# Patient Record
Sex: Female | Born: 1960 | Race: Black or African American | Hispanic: No | Marital: Single | Smoking: Former smoker
Health system: Southern US, Community
[De-identification: ages and names within clinical notes are randomized; demographics above are authoritative.]

## PROBLEM LIST (undated history)

## (undated) DIAGNOSIS — I2510 Atherosclerotic heart disease of native coronary artery without angina pectoris: Secondary | ICD-10-CM

## (undated) DIAGNOSIS — N186 End stage renal disease: Secondary | ICD-10-CM

## (undated) DIAGNOSIS — R569 Unspecified convulsions: Secondary | ICD-10-CM

## (undated) DIAGNOSIS — Z789 Other specified health status: Secondary | ICD-10-CM

## (undated) DIAGNOSIS — Z992 Dependence on renal dialysis: Secondary | ICD-10-CM

## (undated) DIAGNOSIS — I671 Cerebral aneurysm, nonruptured: Secondary | ICD-10-CM

## (undated) DIAGNOSIS — E7800 Pure hypercholesterolemia, unspecified: Secondary | ICD-10-CM

## (undated) DIAGNOSIS — Z720 Tobacco use: Secondary | ICD-10-CM

## (undated) DIAGNOSIS — I4891 Unspecified atrial fibrillation: Secondary | ICD-10-CM

## (undated) DIAGNOSIS — I609 Nontraumatic subarachnoid hemorrhage, unspecified: Principal | ICD-10-CM

## (undated) DIAGNOSIS — I10 Essential (primary) hypertension: Secondary | ICD-10-CM

## (undated) DIAGNOSIS — G9340 Encephalopathy, unspecified: Secondary | ICD-10-CM

## (undated) HISTORY — DX: Essential (primary) hypertension: I10

## (undated) HISTORY — DX: Atherosclerotic heart disease of native coronary artery without angina pectoris: I25.10

## (undated) HISTORY — PX: BRAIN SURGERY: SHX531

## (undated) HISTORY — PX: ABDOMINAL HYSTERECTOMY: SHX81

## (undated) HISTORY — DX: Tobacco use: Z72.0

## (undated) HISTORY — DX: Pure hypercholesterolemia, unspecified: E78.00

---

## 2004-08-06 ENCOUNTER — Inpatient Hospital Stay (HOSPITAL_COMMUNITY): Admission: EM | Admit: 2004-08-06 | Discharge: 2004-08-08 | Payer: Self-pay | Admitting: Emergency Medicine

## 2004-08-06 ENCOUNTER — Ambulatory Visit: Payer: Self-pay | Admitting: Infectious Diseases

## 2004-08-07 ENCOUNTER — Encounter: Payer: Self-pay | Admitting: Cardiology

## 2004-08-15 ENCOUNTER — Ambulatory Visit: Payer: Self-pay | Admitting: Internal Medicine

## 2004-10-01 ENCOUNTER — Ambulatory Visit: Payer: Self-pay | Admitting: Internal Medicine

## 2004-10-01 ENCOUNTER — Inpatient Hospital Stay (HOSPITAL_COMMUNITY): Admission: EM | Admit: 2004-10-01 | Discharge: 2004-10-03 | Payer: Self-pay | Admitting: *Deleted

## 2004-10-09 ENCOUNTER — Ambulatory Visit: Payer: Self-pay | Admitting: Internal Medicine

## 2004-11-27 ENCOUNTER — Ambulatory Visit: Payer: Self-pay | Admitting: Internal Medicine

## 2004-11-27 ENCOUNTER — Ambulatory Visit (HOSPITAL_COMMUNITY): Admission: RE | Admit: 2004-11-27 | Discharge: 2004-11-27 | Payer: Self-pay | Admitting: Internal Medicine

## 2004-11-29 ENCOUNTER — Ambulatory Visit: Payer: Self-pay | Admitting: Internal Medicine

## 2005-05-19 ENCOUNTER — Emergency Department (HOSPITAL_COMMUNITY): Admission: EM | Admit: 2005-05-19 | Discharge: 2005-05-19 | Payer: Self-pay | Admitting: Emergency Medicine

## 2005-06-06 ENCOUNTER — Inpatient Hospital Stay (HOSPITAL_COMMUNITY): Admission: EM | Admit: 2005-06-06 | Discharge: 2005-06-11 | Payer: Self-pay | Admitting: *Deleted

## 2005-08-11 ENCOUNTER — Inpatient Hospital Stay (HOSPITAL_COMMUNITY): Admission: EM | Admit: 2005-08-11 | Discharge: 2005-08-15 | Payer: Self-pay | Admitting: *Deleted

## 2005-08-15 ENCOUNTER — Ambulatory Visit: Payer: Self-pay | Admitting: Family Medicine

## 2005-08-25 ENCOUNTER — Ambulatory Visit: Payer: Self-pay | Admitting: Family Medicine

## 2005-09-05 ENCOUNTER — Ambulatory Visit: Payer: Self-pay | Admitting: *Deleted

## 2005-09-08 ENCOUNTER — Ambulatory Visit: Payer: Self-pay | Admitting: Family Medicine

## 2005-09-23 ENCOUNTER — Ambulatory Visit: Payer: Self-pay | Admitting: Family Medicine

## 2005-10-06 ENCOUNTER — Ambulatory Visit (HOSPITAL_COMMUNITY): Admission: RE | Admit: 2005-10-06 | Discharge: 2005-10-06 | Payer: Self-pay | Admitting: Family Medicine

## 2005-10-23 ENCOUNTER — Emergency Department (HOSPITAL_COMMUNITY): Admission: EM | Admit: 2005-10-23 | Discharge: 2005-10-24 | Payer: Self-pay | Admitting: Emergency Medicine

## 2006-01-07 ENCOUNTER — Ambulatory Visit: Payer: Self-pay | Admitting: Family Medicine

## 2006-01-28 ENCOUNTER — Ambulatory Visit: Payer: Self-pay | Admitting: Family Medicine

## 2006-03-18 ENCOUNTER — Ambulatory Visit: Payer: Self-pay | Admitting: Family Medicine

## 2006-07-06 ENCOUNTER — Ambulatory Visit: Payer: Self-pay | Admitting: Family Medicine

## 2006-07-16 ENCOUNTER — Ambulatory Visit: Payer: Self-pay | Admitting: Family Medicine

## 2006-08-06 ENCOUNTER — Ambulatory Visit: Payer: Self-pay | Admitting: Family Medicine

## 2006-09-17 ENCOUNTER — Ambulatory Visit: Payer: Self-pay | Admitting: Family Medicine

## 2006-12-30 ENCOUNTER — Ambulatory Visit: Payer: Self-pay | Admitting: Family Medicine

## 2007-01-01 ENCOUNTER — Ambulatory Visit (HOSPITAL_COMMUNITY): Admission: RE | Admit: 2007-01-01 | Discharge: 2007-01-01 | Payer: Self-pay | Admitting: Family Medicine

## 2007-03-05 ENCOUNTER — Emergency Department (HOSPITAL_COMMUNITY): Admission: EM | Admit: 2007-03-05 | Discharge: 2007-03-05 | Payer: Self-pay | Admitting: Emergency Medicine

## 2007-08-15 ENCOUNTER — Encounter: Payer: Self-pay | Admitting: Emergency Medicine

## 2007-08-15 ENCOUNTER — Inpatient Hospital Stay (HOSPITAL_COMMUNITY): Admission: EM | Admit: 2007-08-15 | Discharge: 2007-08-17 | Payer: Self-pay

## 2007-09-08 ENCOUNTER — Ambulatory Visit: Payer: Self-pay | Admitting: Internal Medicine

## 2007-09-08 ENCOUNTER — Encounter (INDEPENDENT_AMBULATORY_CARE_PROVIDER_SITE_OTHER): Payer: Self-pay | Admitting: Family Medicine

## 2007-09-08 LAB — CONVERTED CEMR LAB
ALT: 12 units/L (ref 0–35)
AST: 13 units/L (ref 0–37)
Albumin: 4 g/dL (ref 3.5–5.2)
Alkaline Phosphatase: 87 units/L (ref 39–117)
BUN: 15 mg/dL (ref 6–23)
CO2: 22 meq/L (ref 19–32)
Calcium: 9 mg/dL (ref 8.4–10.5)
Chloride: 106 meq/L (ref 96–112)
Cholesterol: 183 mg/dL (ref 0–200)
Creatinine, Ser: 1.41 mg/dL — ABNORMAL HIGH (ref 0.40–1.20)
Glucose, Bld: 96 mg/dL (ref 70–99)
HDL: 40 mg/dL (ref >39)
LDL Cholesterol: 125 mg/dL — ABNORMAL HIGH (ref 0–99)
Potassium: 4.4 meq/L (ref 3.5–5.3)
Sodium: 141 meq/L (ref 135–145)
Total Bilirubin: 0.5 mg/dL (ref 0.3–1.2)
Total CHOL/HDL Ratio: 4.6
Total Protein: 7 g/dL (ref 6.0–8.3)
Triglycerides: 91 mg/dL (ref <150)
VLDL: 18 mg/dL (ref 0–40)

## 2007-12-14 ENCOUNTER — Emergency Department (HOSPITAL_COMMUNITY): Admission: EM | Admit: 2007-12-14 | Discharge: 2007-12-14 | Payer: Self-pay | Admitting: Emergency Medicine

## 2007-12-16 ENCOUNTER — Ambulatory Visit: Payer: Self-pay | Admitting: Internal Medicine

## 2007-12-16 ENCOUNTER — Inpatient Hospital Stay (HOSPITAL_COMMUNITY): Admission: EM | Admit: 2007-12-16 | Discharge: 2007-12-18 | Payer: Self-pay | Admitting: Emergency Medicine

## 2007-12-16 ENCOUNTER — Ambulatory Visit: Payer: Self-pay | Admitting: Cardiology

## 2007-12-17 ENCOUNTER — Encounter (INDEPENDENT_AMBULATORY_CARE_PROVIDER_SITE_OTHER): Payer: Self-pay | Admitting: Internal Medicine

## 2007-12-17 ENCOUNTER — Ambulatory Visit: Admission: RE | Admit: 2007-12-17 | Discharge: 2007-12-17 | Payer: Self-pay | Admitting: Internal Medicine

## 2008-01-05 ENCOUNTER — Encounter (INDEPENDENT_AMBULATORY_CARE_PROVIDER_SITE_OTHER): Payer: Self-pay | Admitting: Family Medicine

## 2008-01-05 ENCOUNTER — Ambulatory Visit: Payer: Self-pay | Admitting: Internal Medicine

## 2008-01-05 LAB — CONVERTED CEMR LAB
BUN: 24 mg/dL — ABNORMAL HIGH (ref 6–23)
CO2: 20 meq/L (ref 19–32)
Calcium: 8.9 mg/dL (ref 8.4–10.5)
Chloride: 110 meq/L (ref 96–112)
Creatinine, Ser: 1.41 mg/dL — ABNORMAL HIGH (ref 0.40–1.20)
Glucose, Bld: 98 mg/dL (ref 70–99)
Potassium: 4.1 meq/L (ref 3.5–5.3)
Sodium: 138 meq/L (ref 135–145)
TSH: 1.189 microintl units/mL (ref 0.350–5.50)

## 2008-01-07 ENCOUNTER — Ambulatory Visit: Payer: Self-pay | Admitting: Internal Medicine

## 2008-02-08 ENCOUNTER — Ambulatory Visit: Payer: Self-pay | Admitting: Cardiology

## 2008-05-28 ENCOUNTER — Emergency Department (HOSPITAL_COMMUNITY): Admission: EM | Admit: 2008-05-28 | Discharge: 2008-05-28 | Payer: Self-pay | Admitting: Emergency Medicine

## 2008-06-05 ENCOUNTER — Ambulatory Visit: Payer: Self-pay | Admitting: Cardiology

## 2008-06-21 ENCOUNTER — Ambulatory Visit: Payer: Self-pay | Admitting: Internal Medicine

## 2008-06-21 ENCOUNTER — Ambulatory Visit: Payer: Self-pay | Admitting: Cardiology

## 2008-06-21 LAB — CONVERTED CEMR LAB
ALT: 16 units/L (ref 0–35)
AST: 16 units/L (ref 0–37)
Albumin: 3.9 g/dL (ref 3.5–5.2)
Alkaline Phosphatase: 82 units/L (ref 39–117)
BUN: 17 mg/dL (ref 6–23)
Bilirubin, Direct: 0.2 mg/dL (ref 0.0–0.3)
CO2: 28 meq/L (ref 19–32)
Calcium: 9.3 mg/dL (ref 8.4–10.5)
Chloride: 112 meq/L (ref 96–112)
Cholesterol: 157 mg/dL (ref 0–200)
Creatinine, Ser: 1.6 mg/dL — ABNORMAL HIGH (ref 0.4–1.2)
GFR calc Af Amer: 45 mL/min
GFR calc non Af Amer: 37 mL/min
Glucose, Bld: 96 mg/dL (ref 70–99)
HDL: 30.7 mg/dL — ABNORMAL LOW (ref >39.0)
LDL Cholesterol: 104 mg/dL — ABNORMAL HIGH (ref 0–99)
Potassium: 3.3 meq/L — ABNORMAL LOW (ref 3.5–5.1)
Sodium: 143 meq/L (ref 135–145)
Total Bilirubin: 0.6 mg/dL (ref 0.3–1.2)
Total CHOL/HDL Ratio: 5.1
Total Protein: 8.1 g/dL (ref 6.0–8.3)
Triglycerides: 112 mg/dL (ref 0–149)
VLDL: 22 mg/dL (ref 0–40)

## 2008-07-14 ENCOUNTER — Ambulatory Visit: Payer: Self-pay | Admitting: Cardiology

## 2008-07-14 LAB — CONVERTED CEMR LAB
BUN: 20 mg/dL (ref 6–23)
CO2: 25 meq/L (ref 19–32)
Calcium: 8.7 mg/dL (ref 8.4–10.5)
Chloride: 111 meq/L (ref 96–112)
Creatinine, Ser: 1.6 mg/dL — ABNORMAL HIGH (ref 0.4–1.2)
GFR calc Af Amer: 45 mL/min
GFR calc non Af Amer: 37 mL/min
Glucose, Bld: 90 mg/dL (ref 70–99)
Potassium: 3 meq/L — ABNORMAL LOW (ref 3.5–5.1)
Sodium: 142 meq/L (ref 135–145)

## 2008-07-25 ENCOUNTER — Ambulatory Visit: Payer: Self-pay | Admitting: Cardiology

## 2008-07-25 LAB — CONVERTED CEMR LAB
BUN: 20 mg/dL (ref 6–23)
CO2: 27 meq/L (ref 19–32)
Calcium: 9.3 mg/dL (ref 8.4–10.5)
Chloride: 107 meq/L (ref 96–112)
Creatinine, Ser: 1.6 mg/dL — ABNORMAL HIGH (ref 0.4–1.2)
GFR calc Af Amer: 45 mL/min
GFR calc non Af Amer: 37 mL/min
Glucose, Bld: 101 mg/dL — ABNORMAL HIGH (ref 70–99)
Potassium: 4.3 meq/L (ref 3.5–5.1)
Sodium: 141 meq/L (ref 135–145)

## 2008-09-11 ENCOUNTER — Ambulatory Visit: Payer: Self-pay | Admitting: Cardiology

## 2008-09-11 LAB — CONVERTED CEMR LAB
ALT: 14 units/L (ref 0–35)
AST: 15 units/L (ref 0–37)
Albumin: 3.4 g/dL — ABNORMAL LOW (ref 3.5–5.2)
Alkaline Phosphatase: 82 units/L (ref 39–117)
Bilirubin, Direct: 0.1 mg/dL (ref 0.0–0.3)
Cholesterol: 169 mg/dL (ref 0–200)
HDL: 32.7 mg/dL — ABNORMAL LOW (ref >39.0)
LDL Cholesterol: 120 mg/dL — ABNORMAL HIGH (ref 0–99)
Total Bilirubin: 0.5 mg/dL (ref 0.3–1.2)
Total CHOL/HDL Ratio: 5.2
Total Protein: 7.2 g/dL (ref 6.0–8.3)
Triglycerides: 83 mg/dL (ref 0–149)
VLDL: 17 mg/dL (ref 0–40)

## 2008-09-19 ENCOUNTER — Ambulatory Visit: Payer: Self-pay | Admitting: Cardiology

## 2008-09-19 DIAGNOSIS — I5033 Acute on chronic diastolic (congestive) heart failure: Secondary | ICD-10-CM

## 2008-09-19 DIAGNOSIS — E782 Mixed hyperlipidemia: Secondary | ICD-10-CM | POA: Insufficient documentation

## 2008-09-19 DIAGNOSIS — I2510 Atherosclerotic heart disease of native coronary artery without angina pectoris: Secondary | ICD-10-CM

## 2008-09-19 DIAGNOSIS — N189 Chronic kidney disease, unspecified: Secondary | ICD-10-CM

## 2008-09-19 DIAGNOSIS — I10 Essential (primary) hypertension: Secondary | ICD-10-CM | POA: Insufficient documentation

## 2008-09-19 DIAGNOSIS — E785 Hyperlipidemia, unspecified: Secondary | ICD-10-CM

## 2008-09-19 DIAGNOSIS — F17200 Nicotine dependence, unspecified, uncomplicated: Secondary | ICD-10-CM | POA: Insufficient documentation

## 2008-11-03 ENCOUNTER — Encounter: Admission: RE | Admit: 2008-11-03 | Discharge: 2008-11-03 | Payer: Self-pay | Admitting: Orthopedic Surgery

## 2008-11-08 ENCOUNTER — Encounter: Admission: RE | Admit: 2008-11-08 | Discharge: 2008-11-08 | Payer: Self-pay | Admitting: Orthopedic Surgery

## 2008-12-22 ENCOUNTER — Ambulatory Visit: Payer: Self-pay | Admitting: Cardiology

## 2008-12-29 ENCOUNTER — Ambulatory Visit: Payer: Self-pay | Admitting: Cardiology

## 2008-12-29 LAB — CONVERTED CEMR LAB
Bilirubin, Direct: 0.1 mg/dL (ref 0.0–0.3)
Calcium: 8.9 mg/dL (ref 8.4–10.5)
GFR calc Af Amer: 52 mL/min
GFR calc non Af Amer: 43 mL/min
Glucose, Bld: 89 mg/dL (ref 70–99)
HDL: 28.7 mg/dL — ABNORMAL LOW (ref >39.0)
LDL Cholesterol: 99 mg/dL (ref 0–99)
Sodium: 141 meq/L (ref 135–145)
Total CHOL/HDL Ratio: 5.1
Total Protein: 6.7 g/dL (ref 6.0–8.3)
VLDL: 19 mg/dL (ref 0–40)

## 2009-05-23 ENCOUNTER — Telehealth: Payer: Self-pay | Admitting: Cardiology

## 2009-06-21 ENCOUNTER — Ambulatory Visit: Payer: Self-pay | Admitting: Cardiology

## 2009-06-21 ENCOUNTER — Telehealth: Payer: Self-pay | Admitting: Cardiology

## 2009-08-30 ENCOUNTER — Encounter (INDEPENDENT_AMBULATORY_CARE_PROVIDER_SITE_OTHER): Payer: Self-pay | Admitting: *Deleted

## 2009-10-09 ENCOUNTER — Inpatient Hospital Stay (HOSPITAL_COMMUNITY): Admission: EM | Admit: 2009-10-09 | Discharge: 2009-10-10 | Payer: Self-pay | Admitting: Emergency Medicine

## 2010-04-05 ENCOUNTER — Telehealth: Payer: Self-pay | Admitting: Cardiology

## 2010-04-09 ENCOUNTER — Telehealth: Payer: Self-pay | Admitting: Cardiology

## 2010-05-01 ENCOUNTER — Encounter (INDEPENDENT_AMBULATORY_CARE_PROVIDER_SITE_OTHER): Payer: Self-pay | Admitting: *Deleted

## 2010-06-22 ENCOUNTER — Inpatient Hospital Stay (HOSPITAL_COMMUNITY): Admission: EM | Admit: 2010-06-22 | Discharge: 2010-06-25 | Payer: Self-pay | Admitting: Emergency Medicine

## 2010-11-17 LAB — CONVERTED CEMR LAB
Albumin: 4 g/dL (ref 3.5–5.2)
BUN: 21 mg/dL (ref 6–23)
Bilirubin, Direct: 0 mg/dL (ref 0.0–0.3)
CO2: 25 meq/L (ref 19–32)
Chloride: 109 meq/L (ref 96–112)
Cholesterol: 142 mg/dL (ref 0–200)
LDL Cholesterol: 84 mg/dL (ref 0–99)
Potassium: 3.6 meq/L (ref 3.5–5.1)
Total Protein: 7.9 g/dL (ref 6.0–8.3)
Triglycerides: 99 mg/dL (ref 0.0–149.0)
VLDL: 19.8 mg/dL (ref 0.0–40.0)

## 2010-11-19 NOTE — Letter (Signed)
Summary: Appointment - Missed  [facility], [place]  [address] [place]   [place], [place] [address]   Phone: [phone]  Fax: [phone]     May 01, 2010 MRN: [id number]   [place], [place]  [address]   Dear Ms. Wetherell,  Our records indicate you missed your appointment on 04/25/10 with Dr.Mclean.  It is very important that we reach you to reschedule this appointment. We look forward to participating in your health care needs. Please contact us at the number listed above at your earliest convenience to reschedule this appointment.     Sincerely, Academic librarian Team  Appended Document: [place]  She should followup in the office.   Appended Document: [place]  flagged and routed  to scheduler to call pt to schedule appt with Dr Aundra Dubin

## 2010-11-19 NOTE — Progress Notes (Signed)
Summary: [facility]   Phone Note Refill Request Call back at [phone] Message from:  Patient on April 05, 2010 10:21 AM  Refills Requested: Medication #1:  PLAVIX 75 MG TABS 1 tab once daily  Medication #2:  FUROSEMIDE 40 MG TABS Take 1 tablet by mouth two times a day  Medication #3:  CARVEDILOL 12.5 MG TABS Take one tablet by mouth twice a day  Medication #4:  LIPITOR 80 MG TABS Take one tablet by mouth daily. POTASSIUM CHLORIDE CRYS CR 20 MEQ CR-TABS (POTASSIUM CHLORIDE CRYS CR) Take one tablet by mouth daily NIT[facility]  [facility] [address]   Method Requested: Fax to [facility] Initial call taken by: Darnell Level,  April 05, 2010 10:22 AM    Prescriptions: LIPITOR 80 MG TABS (ATORVASTATIN CALCIUM) Take one tablet by mouth daily.  #30 x 6   Entered by:   Doug Sou CMA   Authorized by:   Loralie Champagne, MD   Signed by:   Doug Sou CMA on 04/05/2010   Method used:   Electronically to        [facility]. #[phone]* (retail)       [place], [place]  [address]       Ph: [phone]       Fax: [phone]   RxID:   [id number] CARVEDILOL 12.5 MG TABS (CARVEDILOL) Take one tablet by mouth twice a day  #60 x 6   Entered by:   Doug Sou CMA   Authorized by:   Loralie Champagne, MD   Signed by:   Doug Sou CMA on 04/05/2010   Method used:   Electronically to        [facility]. [id number]* (retail)       [place], [place]  [address]       Ph: [phone]       Fax: [phone]   RxID:   [id number] FUROSEMIDE 40 MG TABS (FUROSEMIDE) Take 1 tablet by mouth two times a day  #60 x 6   Entered by:   Doug Sou CMA   Authorized by:   Loralie Champagne, MD   Signed by:   Doug Sou CMA on 04/05/2010   Method used:   Electronically to        [facility]. [id number]* (retail)       [place], [place]  [address]       Ph: [phone]       Fax:  [phone]   RxID:   [id number] PLAVIX 75 MG TABS (CLOPIDOGREL BISULFATE) 1 tab once daily  #30 x 6   Entered by:   Doug Sou CMA   Authorized by:   Loralie Champagne, MD   Signed by:   Doug Sou CMA on 04/05/2010   Method used:   Electronically to        [facility]. [id number]* (retail)       [place], [place]  [address]       Ph: [phone]       Fax: [phone]   RxID:   [id number]

## 2010-11-19 NOTE — Progress Notes (Signed)
Summary: pt requesting [facility] [address]  Medications Added SPIRONOLACTONE 25 MG TABS (SPIRONOLACTONE) Take one tablet by mouth daily POTASSIUM CHLORIDE CRYS CR 20 MEQ CR-TABS (POTASSIUM CHLORIDE CRYS CR) Take one tablet by mouth daily NIFEDIPINE CR OSMOTIC 90 MG XR24H-TAB (NIFEDIPINE) once daily NITROSTAT 0.4 MG SUBL (NITROGLYCERIN) one under tongue as needed for chest pain       Phone Note Call from Patient   Caller: Patient [phone] Reason for Call: Talk to Nurse Summary of Call: [facility] [address] and they didn't have her rx's-she said she [facility]-can she get them resent to [facility] [address]-[facility] [place] far for her Initial call taken by: Lorenda Hatchet,  April 09, 2010 4:35 PM    New/Updated Medications: SPIRONOLACTONE 25 MG TABS (SPIRONOLACTONE) Take one tablet by mouth daily POTASSIUM CHLORIDE CRYS CR 20 MEQ CR-TABS (POTASSIUM CHLORIDE CRYS CR) Take one tablet by mouth daily NIFEDIPINE CR OSMOTIC 90 MG XR24H-TAB (NIFEDIPINE) once daily NITROSTAT 0.4 MG SUBL (NITROGLYCERIN) one under tongue as needed for chest pain Prescriptions: NITROSTAT 0.4 MG SUBL (NITROGLYCERIN) one under tongue as needed for chest pain  #100 x prn   Entered by:   Desiree Lucy, RN, BSN   Authorized by:   Loralie Champagne, MD   Signed by:   Desiree Lucy, RN, BSN on 04/10/2010   Method used:   Electronically to        [facility]. [phone]* (retail)       [address] [place]  [address]       Ph: [phone] or [phone]       Fax: [phone]   RxID:   [id number] NIFEDICAL XL 30 MG XR24H-TAB (NIFEDIPINE) 1 tab along with nifedipune 90 to make 120mg  daily  #30 x 1   Entered by:   Desiree Lucy, RN, BSN   Authorized by:   Loralie Champagne, MD   Signed by:   Desiree Lucy, RN, BSN on 04/10/2010   Method used:   Electronically to        [facility]. [phone]* (retail)       [address] [place]  [address]  Ph: [phone] or [phone]       Fax: [phone]   RxID:   [id number] NIFEDIPINE CR OSMOTIC 90 MG XR24H-TAB (NIFEDIPINE) once daily  #30 x 1   Entered by:   Desiree Lucy, RN, BSN   Authorized by:   Loralie Champagne, MD   Signed by:   Desiree Lucy, RN, BSN on 04/10/2010   Method used:   Electronically to        [facility]. [phone]* (retail)       [address] [place]  [address]       Ph: [phone] or [phone]       Fax: [phone]   RxID:   [id number] POTASSIUM CHLORIDE CRYS CR 20 MEQ CR-TABS (POTASSIUM CHLORIDE CRYS CR) Take one tablet by mouth daily  #30 x 1   Entered by:   Desiree Lucy, RN, BSN   Authorized by:   Loralie Champagne, MD   Signed by:   Desiree Lucy, RN, BSN on 04/10/2010   Method used:   Electronically to        [facility]. [phone]* (retail)  [address] [place]  [address]       Ph: [phone] or [phone]       Fax: [phone]   RxID:   [id number] HYDRALAZINE HCL 50 MG TABS (HYDRALAZINE HCL) 1 three times a day  #90 x 1   Entered by:   Desiree Lucy, RN, BSN   Authorized by:   Loralie Champagne, MD   Signed by:   Desiree Lucy, RN, BSN on 04/10/2010   Method used:   Electronically to        [facility]. [phone]* (retail)       [address] [place]  [address]       Ph: [phone] or [phone]       Fax: [phone]   RxID:   [id number] LIPITOR 80 MG TABS (ATORVASTATIN CALCIUM) Take one tablet by mouth daily.  #30 x 1   Entered by:   Desiree Lucy, RN, BSN   Authorized by:   Loralie Champagne, MD   Signed by:   Desiree Lucy, RN, BSN on 04/10/2010   Method used:   Electronically to        [facility]. [phone]* (retail)       [address], [place]  [address]       Ph: [phone] or [phone]       Fax: [phone]   [id number] CARVEDILOL 12.5 MG TABS (CARVEDILOL) Take one tablet by mouth twice a day  #60 x 1   Entered by:   Desiree Lucy, RN, BSN   Authorized by:   Loralie Champagne, MD   Signed by:   Desiree Lucy, RN, BSN on 04/10/2010   Method used:   Electronically to        [facility]. [phone]* (retail)       [address] [place]  [address]       Ph: [phone] or [phone]       Fax: [phone]   RxID:   [id number] SPIRONOLACTONE 25 MG TABS (SPIRONOLACTONE) Take one tablet by mouth daily  #30 x 1   Entered by:   Desiree Lucy, RN, BSN   Authorized by:   Loralie Champagne, MD   Signed by:   Desiree Lucy, RN, BSN on 04/10/2010   Method used:   Electronically to        [facility]. [phone]* (retail)       [address],   [address]       Ph: [phone] or [phone]       Fax: [phone]   RxID:   [id number] FUROSEMIDE 40 MG TABS (FUROSEMIDE) Take 1 tablet by mouth two times a day  #60 x 1   Entered by:   Desiree Lucy, RN, BSN   Authorized by:   Loralie Champagne, MD   Signed by:   Desiree Lucy, RN, BSN on 04/10/2010   Method used:   Electronically to        [facility]. [phone]* (retail)       [address]       [place],   [address]       Ph: [phone] or [phone]       Fax: [phone]  [id number] PLAVIX 75 MG TABS (CLOPIDOGREL BISULFATE) 1 tab once daily  #30 x 1   Entered by:   Desiree Lucy, RN, BSN   Authorized by:   Loralie Champagne, MD   Signed by:   Desiree Lucy, RN, BSN on 04/10/2010   Method used:   Electronically to        [facility]. [phone]* (retail)       [address], [place]  [address]       Ph: [phone] or [phone]       Fax: [phone]   [id number]  pt overdue for appt with Dr Mellody Drown made for pt with Dr Aundra Dubin 04/25/10--pt aware

## 2011-01-02 LAB — CBC
HCT: 41.4 % (ref 36.0–46.0)
HCT: 41.7 % (ref 36.0–46.0)
Hemoglobin: 12.5 g/dL (ref 12.0–15.0)
Hemoglobin: 13.9 g/dL (ref 12.0–15.0)
MCH: 29.4 pg (ref 26.0–34.0)
MCH: 29.5 pg (ref 26.0–34.0)
MCHC: 33.5 g/dL (ref 30.0–36.0)
MCV: 87.5 fL (ref 78.0–100.0)
RBC: 4.25 MIL/uL (ref 3.87–5.11)
RBC: 4.73 MIL/uL (ref 3.87–5.11)
RDW: 15.9 % — ABNORMAL HIGH (ref 11.5–15.5)

## 2011-01-02 LAB — BASIC METABOLIC PANEL
BUN: 22 mg/dL (ref 6–23)
BUN: 22 mg/dL (ref 6–23)
CO2: 23 mEq/L (ref 19–32)
CO2: 24 mEq/L (ref 19–32)
CO2: 25 mEq/L (ref 19–32)
Calcium: 8.3 mg/dL — ABNORMAL LOW (ref 8.4–10.5)
Calcium: 9.1 mg/dL (ref 8.4–10.5)
Chloride: 106 mEq/L (ref 96–112)
Chloride: 107 mEq/L (ref 96–112)
Creatinine, Ser: 1.75 mg/dL — ABNORMAL HIGH (ref 0.4–1.2)
GFR calc Af Amer: 37 mL/min — ABNORMAL LOW (ref >60)
GFR calc Af Amer: 45 mL/min — ABNORMAL LOW (ref >60)
GFR calc non Af Amer: 31 mL/min — ABNORMAL LOW (ref >60)
GFR calc non Af Amer: 34 mL/min — ABNORMAL LOW (ref >60)
Glucose, Bld: 93 mg/dL (ref 70–99)
Potassium: 3.5 mEq/L (ref 3.5–5.1)
Sodium: 137 mEq/L (ref 135–145)
Sodium: 139 mEq/L (ref 135–145)

## 2011-01-02 LAB — URINALYSIS, ROUTINE W REFLEX MICROSCOPIC
Leukocytes, UA: NEGATIVE
Nitrite: NEGATIVE
Specific Gravity, Urine: 1.006 (ref 1.005–1.030)
Urobilinogen, UA: 0.2 mg/dL (ref 0.0–1.0)

## 2011-01-02 LAB — URINE MICROSCOPIC-ADD ON

## 2011-01-02 LAB — POCT CARDIAC MARKERS: CKMB, poc: 3.1 ng/mL (ref 1.0–8.0)

## 2011-01-20 LAB — POCT I-STAT, CHEM 8
BUN: 21 mg/dL (ref 6–23)
Creatinine, Ser: 1.5 mg/dL — ABNORMAL HIGH (ref 0.4–1.2)
Glucose, Bld: 84 mg/dL (ref 70–99)
Hemoglobin: 16.3 g/dL — ABNORMAL HIGH (ref 12.0–15.0)
TCO2: 23 mmol/L (ref 0–100)

## 2011-01-20 LAB — BASIC METABOLIC PANEL
Chloride: 107 mEq/L (ref 96–112)
Creatinine, Ser: 1.87 mg/dL — ABNORMAL HIGH (ref 0.4–1.2)
GFR calc Af Amer: 35 mL/min — ABNORMAL LOW (ref >60)
GFR calc non Af Amer: 29 mL/min — ABNORMAL LOW (ref >60)
Potassium: 3.1 mEq/L — ABNORMAL LOW (ref 3.5–5.1)

## 2011-01-20 LAB — CBC
MCV: 89.8 fL (ref 78.0–100.0)
Platelets: 192 10*3/uL (ref 150–400)
RBC: 4.45 MIL/uL (ref 3.87–5.11)
WBC: 5.7 10*3/uL (ref 4.0–10.5)

## 2011-01-20 LAB — DIFFERENTIAL
Eosinophils Absolute: 0.1 10*3/uL (ref 0.0–0.7)
Lymphocytes Relative: 34 % (ref 12–46)
Lymphs Abs: 1.9 10*3/uL (ref 0.7–4.0)
Monocytes Relative: 5 % (ref 3–12)
Neutrophils Relative %: 58 % (ref 43–77)

## 2011-01-20 LAB — LIPID PANEL
Total CHOL/HDL Ratio: 3 RATIO
Triglycerides: 65 mg/dL (ref <150)
VLDL: 13 mg/dL (ref 0–40)

## 2011-02-22 ENCOUNTER — Emergency Department (HOSPITAL_COMMUNITY)
Admission: EM | Admit: 2011-02-22 | Discharge: 2011-02-22 | Disposition: A | Discharge status: Home / Self Care | Payer: No Typology Code available for payment source | Attending: Emergency Medicine | Admitting: Emergency Medicine

## 2011-02-22 DIAGNOSIS — M5430 Sciatica, unspecified side: Secondary | ICD-10-CM | POA: Insufficient documentation

## 2011-02-22 DIAGNOSIS — E785 Hyperlipidemia, unspecified: Secondary | ICD-10-CM | POA: Insufficient documentation

## 2011-02-22 DIAGNOSIS — I10 Essential (primary) hypertension: Secondary | ICD-10-CM | POA: Insufficient documentation

## 2011-02-22 DIAGNOSIS — I2510 Atherosclerotic heart disease of native coronary artery without angina pectoris: Secondary | ICD-10-CM | POA: Insufficient documentation

## 2011-03-04 NOTE — Cardiovascular Report (Signed)
NAME:  [name], [name]                ACCOUNT NO.:  [id number]   MEDICAL RECORD NO.:  [id number]          PATIENT TYPE:  INP   LOCATION:  6532                         FACILITY:  [facility]   PHYSICIAN:  Vanna Scotland. Olevia Perches, MD, FACCDATE OF BIRTH:  30-Mar-1961   DATE OF PROCEDURE:  12/17/2007  DATE OF DISCHARGE:                            CARDIAC CATHETERIZATION   CLINICAL HISTORY:  [name] is 50 years old and has no prior history of  known heart disease although she does have positive family history for  heart disease and has hypertension.  She also has a history of remote  intracranial hemorrhage and chronic renal insufficiency with creatinine  of 1.4.  She was admitted to the hospital with chest pain and had  positive troponins consistent with a non-ST-elevation myocardial  infarction.   PROCEDURE:  The procedure was performed via the right femoral artery  using an arterial sheath and initially 5-French preformed coronary  catheters.  After completion of the diagnostic study, made a decision to  proceed with intervention on the lesion in the distal right coronary  artery.   We exchanged for a 6-French system.  We used an AL1 6-French guiding  catheter with side holes.  The patient was given Angiomax bolus infusion  and had been previously given chewable aspirin and was given a 600-mg  Plavix load.  We passed a Prowater wire down the right coronary artery  across the lesion in the distal right coronary into the posterior  descending branch without difficulty.  We predilated with a 2.25 x 15 mm  Maverick, performing 1 inflation up to 8 atmospheres for 20 seconds.  We  then deployed a 2.5 x 15 mm Promus stent, deploying this with 1  inflation up to 12 atmospheres for 30 seconds.  The stent crossed the  distal right coronary artery, feeding the posterolateral branch.  We  then postdilated with a 2.75 x 12 mm Quantum Maverick, performing 2  inflations up to 16 atmospheres for 20 seconds.  Final  volume study then  performed through the guiding catheter.   We then performed IVUS of the circumflex artery as part of the SATURN  trial.  We used a CLS 3.5 guiding catheter with side holes.  We passed a  Prowater wire down the circumflex artery into the first large  posterolateral branch.  We then advanced the Atlantis IVUS catheter into  the posterolateral branch and did automatic pullback all the way back  into the guiding catheter.  The IVUS and wire were then removed and  final volume studies were then performed through the guiding catheter.  Nitroglycerin was given before the IVUS run.   The patient tolerated the procedure well.  The right femoral artery was  closed with Angio-Seal at the end of the procedure.   RESULTS:  The left main coronary artery was free of significant disease.   The left anterior descending artery gave rise to 5 diagonal branches and  3 septal perforators.  There was 70% narrowing in the distal LAD after  the last diagonal branch.  It had a hazy appearance  and may have  represented a ruptured plaque.   The circumflex artery gave rise to a large marginal branch, an atrial  branch, a first large posterolateral branch and 2 smaller posterolateral  branches.  There was 50% narrowing in the AV circumflex artery after the  first large posterolateral branch.  There was 30% narrowing and  irregularity in the mid circumflex artery.   The right coronary artery was a moderate-sized vessel that gave rise to  a conus branch, a right ventricular branch, a posterior descending  branch and a posterolateral branch.  There was 95% stenosis in the  distal right coronary.  There was 40% narrowing at the ostium of the AV  branches after the posterior descending branch, which fed a small- to  moderate-sized posterolateral branch.   No left ventriculogram was performed because of a creatinine of 1.4.   Following stenting of the lesion, the distal right coronary artery   stenosis improved from 95% to 0%.  The stent crossed the posterolateral  branch and there was about 50% narrowing at the ostium of the  posterolateral branch at the stent site.   We performed IVUS run of the circumflex artery, and this showed a  moderate amount of plaque in the first large posterolateral branch and  the mid circumflex artery.  None of this was obstructive.   CONCLUSION:  1. Coronary artery disease with recent non-ST-elevation myocardial      infarction with 70% stenosis in the distal LAD, 30% mid stenosis      and 50% distal stenosis in the circumflex artery, 95% stenosis in      the distal right coronary artery with 40% narrowing in the      posterolateral branch.  2. Successful stenting of the lesion in the distal right coronary      artery using a Promus drug-eluting stent with improvement in      narrowing from 95% to 0%.  3. IVUS of the circumflex artery demonstrating a moderate amount of      plaque (SATURN trial).   RECOMMENDATIONS:  The patient returned post __________ for further  observation.  We will plan discharge tomorrow if the patient remains  stable.  If her lab work and IVUS qualify, she will be enrolled in the  SATURN trial and randomized to a high dose rosuvastatin versus  atorvastatin and will need followup cath and IVUS at 2 years.      Bruce Alfonso Patten Olevia Perches, MD, [facility]  Electronically Signed     BRB/MEDQ  D:  12/17/2007  T:  12/18/2007  Job:  [id number]   cc:   Satira Sark, MD  [facility]

## 2011-03-04 NOTE — Assessment & Plan Note (Signed)
[facility] OFFICE NOTE   [name], [name]                         MRN:          [id number]  DATE:12/22/2008                            DOB:          February 04, 1961    PRIMARY CARE PHYSICIAN:  Through [facility].   HISTORY OF PRESENT ILLNESS:  This is a 50 year old with history of  coronary artery disease and severe hypertension who presents to  Cardiology Clinic for reevaluation.  The patient's blood pressure today  is 146/90, which is better than it has been at times in the past.  However, it is still elevated.  She actually is doing fairly well from a  cardiopulmonary standpoint.  She is walking.  She walks on flat ground  without any dyspnea on exertion.  She is able to climb a flight of steps  with no trouble.  She does not have any ischemic type chest pain.  The  patient's main complaint recently has been severe pain in her left  shoulder.  She has difficulty with left arm abduction.  She is unable to  abduct beyond 90 degrees.  We did send her to see Orthopedics last time  she was here.  She says she followed up in their office.  She is not  sure what they have done so far.  She did miss her last appointment,  however, and does need to get back in to see them.   MEDICATIONS:  1. Lasix 40 mg twice a day.  2. Potassium 20 mEq daily.  3. Plavix 75 mg daily.  4. Aspirin 81 mg daily.  5. Spirolactone 25 mg daily.  6. Nifedipine XL 120 mg daily.  7. Lisinopril 20 mg daily.  8. Hydralazine 25 mg 3 times a day.  9. Zocor 80 mg daily.  10.Coreg 12.5 mg b.i.d.   PAST MEDICAL HISTORY:  1. Hypertension.  This has been resistant and a quite severe.  She      does have severe LVH.  2. Hyperlipidemia.  3. Tobacco abuse.  The patient smokes about half pack a day.  She has      been able to stop smoking in the past using nicotine patches.  4. Coronary artery disease.  The patient had an ST-elevation MI  February 2009.  Left heart catheterization at that time showed 70%      distal LAD lesion that was hazy consistent with plaque rupture.      She also had a 50% distal circumflex stenosis and a 95% distal RCA      stenosis.  She had a drug-eluting stent placed in her distal RCA.  5. Chronic kidney disease.  The patient's most recent creatinine was      1.6 in October 2009.  6. Remote intracranial hemorrhage.  7. Diastolic congestive heart failure.  The patient's most recent echo      was in February 2009 with an EF of 60% and severe LVH.  The patient      did have evidence for diastolic dysfunction.  The RV appeared      normal.  ALLERGIES:  The patient is allergic to PENICILLIN.   SOCIAL HISTORY:  The patient lives alone.  She plays a significant role  in caring for her 8 grandchildren.  She smokes about half pack a day,  does not use any drugs.   PHYSICAL EXAMINATION:  VITAL SIGNS:  Blood pressure is 146/90, heart  rate is 78 and regular.  GENERAL:  This is a well-developed female in no apparent stress.  NEUROLOGIC:  Alert and oriented x3.  Normal affect.  NECK:  There is no JVD.  There is no thyromegaly or thyroid nodule.  LUNGS:  Clear to auscultation bilaterally.  Normal respiratory effort.  CARDIOVASCULAR:  Heart regular S1, S2.  There is an S4.  There is no  murmur.  There is no peripheral edema.  There is no carotid bruit.  ABDOMEN:  Soft, nontender.  No hepatosplenomegaly.  MUSCULOSKELETAL:  The patient is unable to abduct her left arm above 90  degrees.  There is significant pain if she tries to bring it higher.   ASSESSMENT AND PLAN:  This is a 50 year old with history of hypertension  and coronary artery disease who presents to Cardiology Clinic for  continued followup of her blood pressure and her coronary artery  disease.  1. Coronary artery disease.  The patient is stable with no ischemic      symptoms.  She is status post placement of drug-eluting stent in      the  distal RCA.  She is currently on aspirin, Plavix, statin, beta-      blocker, and ACE inhibitor.  We will continue her on this current      medical regimen for now.  2. Hypertension.  The patient is on an aggressive blood pressure      regimen on multiple medications.  She says she has been taking all      of her medications.  She is getting them through [facility].  She      does have severe LVH on her echo and has been hypertensive for many      years.  Her blood pressure is close to goal today but still high      146/90. I am going to have her increase her Coreg today to 25 mg      twice a day.  We will have her back in 1 week for a blood pressure      check.  At that time, if it is still elevated, we can increase her      hydralazine.  3. Diastolic congestive heart failure.  The patient appears euvolemic      today.  We will continue her on her current dose of Lasix when she      comes back in for her blood pressure check.  We will check her      chemistries.  I think probably next time I see her, we will drop      her Lasix back down to 40 mg daily.  4. Hyperlipidemia.  The patient missed her cholesterol check.  When      she comes back for blood pressure check, we will check her lipids      on the increased doses of Zocor.  Her goal LDL for her will be less      than 70.  5. Smoking.  I once again encouraged the patient to quit smoking.  6. Musculoskeletal.  Once again, I do worry the patient has rotator  cuff injury and may be developing a frozen shoulder on the left.      She has seen Orthopedics; however, she missed her last appointment      and does not have followup.  We will try to help her arrange follow      up      again.  She does need to avoid nonsteroidal anti-inflammatory drugs      due to her chronic kidney disease.  I did give her a prescription      for tramadol last time I saw her.     Loralie Champagne, MD  Electronically Signed    DM/MedQ  DD: 12/22/2008   DT: 12/23/2008  Job #: [id number]   cc:   Myriam Jacobson

## 2011-03-04 NOTE — H&P (Signed)
NAME:  [name], [name]                ACCOUNT NO.:  [id number]   MEDICAL RECORD NO.:  [id number]          PATIENT TYPE:  INP   LOCATION:  0101                         FACILITY:  [facility]   PHYSICIAN:  Vladimir Faster, MD        DATE OF BIRTH:  Jun 28, 1961   DATE OF ADMISSION:  08/14/2007  DATE OF DISCHARGE:                              HISTORY & PHYSICAL   PRIMARY CARE PHYSICIAN:  [facility].  This patient is unassigned to [place].   CHIEF COMPLAINT:  Headaches.   HISTORY OF THE PRESENT ILLNESS:  The patient is a 50 year old lady with  a history of hypertension who comes in with a headache, which started  three to four days ago.  She has a history of hypertension, but  apparently she stopped taking her medications three days ago secondary  to financial issues, and she developed headaches in the back of her  head.  She does have a history of chronic headaches, but her headaches  have progressively gotten worse.  Her headaches also radiate to her neck  and she states her neck felt stiff.  Today she also had blurred vision  and so she decided to come to the ER.  She has not had any chest pain,  any shortness of breath or any weakness.   PAST MEDICAL HISTORY:  1. Hypertension.  2. Brain aneurysm.  3. History of chronic headaches.   PAST SURGICAL HISTORY:  1. History of clipping of aneurysm in 1996, which was performed at      [facility].  2. Hysterectomy.  3. Left foot surgery.   ALLERGIES:  The patient is allergic to PENICILLIN and XANAX .   CURRENT MEDICATIONS:  The patient is on:  1. Clonidine 0.2 mg twice daily.  2. Lasix 20 mg daily.  3. Norvasc 10 mg daily.  4. Lisinopril 5 mg daily.  5. Metoprolol 100 mg daily.  6. Butalbital/acetaminophen as needed.   SOCIAL HISTORY:  The patient lives with her sister.  She is divorced.  She has two children.  She smokes approximately one-half pack of  cigarettes per day; she has smoked for approximately 30 years. Denies  any history of  alcohol or drug abuse.   FAMILY HISTORY:  The patient's father died at the age of 20; he had a  cardiac arrest and he was on dialysis.  Her mother died at the age of 33  a heart attack.  Both parents had hypertension.   REVIEW OF SYSTEMS:  GENERAL:  The patient denies any recent weight loss  or weight gain.  No fever or chills.  HEENT:  Complains of headaches and  blurred vision.  No sore throat.  CARDIOVASCULAR SYSTEM:  Denies chest  pain or palpitations.  RESPIRATORY SYSTEM:  No shortness of breath or  cough.  GASTROINTESTINAL:  No abdominal pain, nausea, vomiting, diarrhea  or constipation.   PHYSICAL EXAMINATION:  GENERAL APPEARANCE:  The patient is alert and  oriented times three.  VITAL SIGNS:  Temperature; afebrile.  Pulse rate of 99 per minute.  Blood pressure on arrival was 275/185,  which has come down to 184/97.  Respiratory rate 20 per minute.  Oxygen saturation 97% in room air.  HEENT:  Atraumatic and normocephalic.  Pupils are equal, round and react  to light.  Examination of her fundi do not show any papilledema.  Mucous  membranes are moist.  NECK:  The neck is supple.  No JVD.  No carotid bruits.  HEART:  Cardiovascular reveals no murmurs, gallops or rubs heard.  CHEST:  The chest is clear to auscultation.  ABDOMEN:  The abdomen is soft.  Bowel sounds are heard.  EXTREMITIES:  The extremities show no edema, cyanosis or clubbing.   LABORATORY DATA:  The labs show a white count of 8,000, hemoglobin 14.9  and platelets 310,000.  Sodium 140, potassium 4, BUN 17, creatinine  1.45, and glucose of 94.  Urinalysis is negative.  She has an EKG, which  showed LVH.   IMPRESSION:  1. Hypertensive emergency.  2. Uncontrolled hypertension.  3. Mild renal insufficiency.   PLAN:  1. Hypotensive emergency.  This is a 50 year old lady with a history      of hypertension who stopped taking her medications three days ago      and who comes in with uncontrolled blood pressure.  She  also      complains of headaches and blurred vision.  She has a history of an      aneurysm of the brain.  We will admit her to the ICU and we will      start her on a nitroprusside drip.  She was started on a      nitroglycerin drip in the ER, which is probably contributing to her      headache.  I will also use as needed labetalol to control her blood      pressure, and I will restart her home dose medications. We will      have to evaluate her to see if she has had a workup for secondary      hypertension; and, if not, this can be done as an outpatient.  She      may also need for further evaluation for sleep apnea, which may be      contributing to her elevated blood pressure.  2. Renal insufficiency.  Her creatinine is 1.45.  I suspect that this      is probably chronic renal insufficiency secondary to her blood      pressure.  She will need further evaluation of this as an      outpatient.  3. I will put her on ACE inhibitor for DVT prophylaxis.      Vladimir Faster, MD  Electronically Signed     PKN/MEDQ  D:  08/15/2007  T:  08/15/2007  Job:  [id number]

## 2011-03-04 NOTE — Consult Note (Signed)
NAME:  FLARENCE, EXANTUS                ACCOUNT NO.:  [id number]   MEDICAL RECORD NO.:  [id number]          PATIENT TYPE:  INP   LOCATION:  [id number]                         FACILITY:  [place]   PHYSICIAN:  Satira Sark, MD DATE OF BIRTH:  02-20-1961   DATE OF CONSULTATION:  DATE OF DISCHARGE:                                 CONSULTATION   REQUESTING SERVICE:  [facility] D Team.   PRIMARY CARE:  Through [facility].   REASON FOR CONSULTATION:  Dyspnea on exertion and chest pain.   HISTORY OF PRESENT ILLNESS:  Ms. Reczek  is a pleasant 50 year old woman  with a longstanding history of hypertension, previous renal  insufficiency with recent creatinine of 1.2, remote intracranial bleed  due to aneurysm status post clipping procedure in 1994 at [facility] and reportedly stable since that time, and no personal  history of cardiovascular disease or myocardial infarction.  Over the  last 1-2 weeks she has been experiencing progressive shortness of breath  particular with activity such as climbing stairs.  She reports she was  recently seen in the [facility] emergency department and diagnosed with  asthma being given metered-dose inhalers to try.  She reports no  improvement with this and has continued and shortness of breath, today  developing chest pressure associated with exertion.  She went to  [facility] and was treated with labetalol due to elevated blood  pressure and ultimately presented to the emergency department for  further evaluation.  She may have been scheduled for further cardiac  evaluation over the next week although I do not have the details of  this.  At the present time she is chest painfree.  Her blood pressure  was initially up to 174/110, although now down to 157/98.  Her  electrocardiogram shows poor anterior R-wave progression with overall  relatively nonspecific ST-T wave changes noted diffusely.  Her cardiac  markers show increase of less than  0.05 up to 0.09 point of care with  subsequent standard troponin-I checked at 0.35.  CK-MB level was 7.7  with a total CK of 164.  Her chest x-ray demonstrates no acute changes  with somewhat prominent left ventricular shadow.  She also had a CT scan  of her head showing no acute changes and evidence of previous clipping  procedure.  We have been asked to evaluate her further.   ALLERGIES:  To PENICILLIN and AMOXICILLIN.   MEDICATIONS AT HOME:  Include clonidine, Norvasc, Lasix, lisinopril.   PAST MEDICAL HISTORY:  Includes longstanding hypertension, anxiety  disorder, previous documented renal insufficiency, remote intracranial  bleed due to aneurysm status post clipping procedure in 1994 at [facility], hysterectomy, previously documented ejection  fraction of 60% with some type of stress testing many years ago at [facility].  The patient reports no personal history of  coronary artery disease or myocardial infarction.   SOCIAL HISTORY:  The patient was in [place] with her daughter.  She  is unemployed.  She has a one-pack per day tobacco use history for many  years.  No alcohol use.  No report illicit substance use.   FAMILY HISTORY:  Is significant for premature cardiovascular disease.  The patient's mother died at age 48 with myocardial infarction.  The  patient's father died at age 36 with myocardial infarction.  He also had  end-stage renal disease.   REVIEW OF SYSTEMS:  As outlined above.  The patient has occasional  headaches.  She reports a remote history of syncope related to blood  pressure.  She has occasional palpitations, dyspnea on exertion chest  pain, some symptoms of depression, reflux disease and some tingling in  her hands and feet at times.  Otherwise review of systems is negative.   EXAMINATION:  Oxygen saturation 100% percent on 2 liters nasal cannula.  Heart rate 68 and regular, respirations 20, blood pressure  157/98.  The  patient is comfortable and in no acute distress.  HEENT:  Conjunctivae  is normal.  Pharynx is clear.  NECK is supple.  No elevated jugular venous pressure, no loud bruits, no  thyromegaly noted.  No thyroid tenderness.  LUNGS:  Generally clear, nonlabored breathing.  CARDIAC exam reveals a regular rate and rhythm.  Soft S4.  No  pericardial rub or S3 gallop.  ABDOMEN:  Soft, nontender.  Bowel sounds present.  EXTREMITIES:  Exhibit no significant pitting edema.  Distal pulses are  2+.  SKIN:  Warm and dry.  MUSCULOSKELETAL:  No kyphosis noted.  NEUROPSYCHIATRIC the patient is alert and oriented x3.  Moves all  extremities.  Affect is appropriate to situation.   LABORATORY DATA:  Hemoglobin 16.0, hematocrit 47.0.  INR 1.1.  D-dimer  0.43.  Sodium 140, potassium 3.6, chloride 107, glucose 98, BUN 10,  creatinine 1.25 up to 1.4, magnesium 2.0, BNP 206   IMPRESSION:  1. Progressive shortness of breath with activity and recent chest      pressure concerning for unstable angina in a 50 year old woman with      longstanding hypertension, tobacco abuse, and a significant family      history of premature cardiovascular disease.  Her electrocardiogram      is abnormal but relatively nonspecific and her troponin I level is      mildly increased.  All taken together this is consistent with      unstable angina with minor enzymatic evidence of a non-ST-elevation      myocardial infarction.  The patient has had no recent ischemic      evaluation although reports a normal stress test several years ago.      She is chest pain free at the present time.  2. History of renal insufficiency with creatinine of 1.2 up to 1.4.  3. Remote history of intracranial bleed associated with aneurysm      status post clipping procedure in 1994 at [facility]. The patient reports this has been stable and in fact had      a recent head CT scan showing no acute process.    RECOMMENDATIONS:  We discussed the situation with the patient.  We  reviewed the potential risks and benefits of a diagnostic cardiac  catheterization to clearly outline her coronary anatomy and assess for  any revascularizations options in addition to medical therapy.  She is  in agreement with this.  We will schedule the procedure for tomorrow.  Would obtain better blood pressure control prior to initiating  anticoagulant therapies.  Would follow up with lipid profile as well.  Further  plans to follow.      Satira Sark, MD  Electronically Signed    SGM/MEDQ  D:  12/16/2007  T:  12/16/2007  Job:  [id number]   cc:   Myriam Jacobson

## 2011-03-04 NOTE — Assessment & Plan Note (Signed)
[place] OFFICE NOTE   KINZE, DAUPHINE                         MRN:          [id number]  DATE:06/21/2008                            DOB:          July 08, 1961    PRIMARY CARE PHYSICIAN:  [facility].   PRIMARY CARDIOLOGIST:  Loralie Champagne, MD   This is a 50 year old African American female with history of coronary  artery disease and severe hypertension.  Dr. Aundra Dubin recently saw her and  started her on Procardia and added potassium for her hypokalemia.  The  patient walks in today for blood pressure check, blood pressure was  170/104, but she did not take her medications before she came.  She also  told the nurse she was having some chest and left shoulder pain so was  brought back for me to see.  She says she is in the middle of moving and  has been lifting a lot heavy boxes.  She describes her chest pain as  pins and needles and sharp shooting and her left shoulder hurts from  lifting the boxes.  She denies any tightness, pressure, palpitations,  dizziness, or presyncope.  She was tender to touch when I examined her  chest.  She says she cannot take the potassium, she cannot swallow the  pills, we changed it to the liquids and she says that it tastes too bad,  that she cannot drink it.   CURRENT MEDICATIONS:  1. Lisinopril 20 mg b.i.d.  2. Plavix 75 mg daily.  3. Simvastatin 20 mg q.h.s.  4. Furosemide 40 mg 1-1/2 b.i.d.  5. Aspirin 81 mg daily.  6. Procardia XL 90 mg daily.  7. Spironolactone 25 mg daily.  8. Potassium 30 mEq daily.  9. Coreg 12.5 mg b.i.d.  10.Nicoderm patch, she has not started.   PHYSICAL EXAMINATION:  GENERAL:  This is an anxious 51 year old [place]  American female, in no acute distress.  VITAL SIGNS:  Blood pressure 170/104, which is actually better than when  she was here seeing Dr. Aundra Dubin it was 180/112, pulse is 73.  NECK:  Without JVD, HJR bruit or thyroid enlargement.  LUNGS:  Clear anterior, posterior and lateral.  HEART:  Regular rate and rhythm at 70 beats per minute.  Normal S1 and  S2.  No murmur, rub, bruit, thrill or heave noted.  The patient's  anterior chest is tender when I put the stethoscope on her.  ABDOMEN:  Soft without organomegaly, masses, lesions or abnormal  tenderness.  EXTREMITIES:  Without cyanosis, clubbing, or edema.  She has good distal  pulses.   EKG normal sinus rhythm, poor R-wave progression.  No acute change from  prior tracings.   IMPRESSION:  1. Chest pain, I feel musculoskeletal.  2. Hypertension, poorly-controlled but did not take her medications      today.  3. History of hypokalemia, refuses to take potassium supplement.  4. History of coronary artery disease, status post drug-eluting stent      in the distal right coronary artery.  5. Diastolic heart failure stable.   PLAN:  I have asked the patient to go ahead and take her  antihypertensives when she gets home.  She did ask for Vicodin for her  shoulder pain, which I told her I could not give her and told her to  take Advil for this.  I encouraged her to try taking the potassium.  We  will check a BMET today and if she refuses to take potassium, I told her  to increase her dietary potassium such as bananas.  She has an  appointment to see Dr. Aundra Dubin back in followup in the near future.      Ermalinda Barrios, PA-C  Electronically Signed      Deboraha Sprang, MD, [facility]  Electronically Signed   ML/MedQ  DD: 06/21/2008  DT: 06/22/2008  Job #: [id number]

## 2011-03-04 NOTE — Assessment & Plan Note (Signed)
[place] OFFICE NOTE   TAMMEY, TUNNEY                         MRN:          [id number]  DATE:06/05/2008                            DOB:          23-Mar-1961    PRIMARY CARE PHYSICIAN:  [facility].   HISTORY OF PRESENT ILLNESS:  This is a 50 year old with a history of  coronary artery disease and severe hypertension who presents to  Cardiology Clinic for reevaluation.  The patient states that she has  been doing well symptomatically.  She has had no chest pain.  She is  able to walk around on flat ground and do her housework without  shortness of breath.  She has mild shortness of breath when she climbs  to the top of a flight of stairs; however, she is able to do that  without much trouble.  She has no orthopnea, no PND, and no episodes of  lightheadedness or presyncope.  She states that she has been under a lot  of stress lately.  She has been caring for her 8 grandchildren, and in  that situation, she started smoking again.  She smokes about a half a  pack a day.  Blood pressure today is quite elevated at 180/112; however,  the patient states that is low for her and it has been running much  higher than that.  She says she cannot take her potassium pills because  they are too large.   PAST MEDICAL HISTORY:  1. Hypertension,  poorly controlled.  2. Hypercholesterolemia.  3. Tobacco abuse.  The patient has resumed smoking a half a pack a      day.  She was a smoker in the past and states that she was able to      stop in the past using nicotine patches.  4. Coronary artery disease.  The patient had known ST elevation MI in      February 2009.  Left heart catheterization at that time showed a      70% distal LAD lesion that was hazy consistent with a plaque      rupture.  She had a 50% distal circumflex stenosis and a 95% distal      RCA stenosis.  She did have a drug-eluting stent placed in her  distal RCA.  5. Chronic kidney disease.  The patient's most recent creatinine was      1.2 this month.  6. Remote intracranial hemorrhage.  7. Diastolic congestive heart failure.  The patient's most recent echo      was in February 2009 with an EF of 60% and severe left ventricular      hypertrophy.  The patient did have evidence of diastolic      dysfunction.  The RV appeared normal.   ALLERGIES:  The patient is allergic to PENICILLIN.   EKG today is normal sinus rhythm with left ventricular hypertrophy with  repolarization changes secondary to hypertrophy.   MEDICATIONS:  1. The patient is currently on lisinopril 20 mg b.i.d.  2. She is supposed to be on potassium 10 mEq  b.i.d.; however, she is      not taking potassium because she says that pills are too large.  3. Plavix 75 mg daily.  4. Zocor 20 mg daily.  5. Amlodipine 10 mg daily.  6. Aspirin 81 mg daily.  7. Lasix 60 mg b.i.d.  8. Atenolol 50 mg daily.   Most recent labs in early of this month showed a potassium of 2.6 and a  creatinine of 1.2.   SOCIAL HISTORY:  The patient lives alone now; however, she does play a  significant role in caring for 8 grandchildren.  She has resumed smoking  a half a pack a day.   PHYSICAL EXAMINATION:  VITAL SIGNS:  Heart rate is 68 and regular, blood  pressure is 180/112, and weight is 169 pounds.  GENERAL:  No apparent distress and well-appearing female.  NECK:  No thyromegaly or nodules.  JVP approximately 8 cm of water.  LUNGS:  Clear to auscultation bilaterally.  Normal respiratory effort.  HEART:  Regular S1 and S2.  There is an S4.  There is no murmur.  There  is trace peripheral edema.  There is no carotid bruit.  ABDOMEN:  Soft and nontender.  No hepatosplenomegaly.  EXTREMITIES:  No clubbing or cyanosis.  NEUROLOGIC:  The patient is alert and oriented x3.  Normal affect.   ASSESSMENT AND PLAN:  This is a 50 year old with a history of  hypertension and coronary artery  disease who presents to Cardiology  Clinic for evaluation.  1. Coronary artery disease.  The patient is stable with no ischemic      symptoms.  She is status post placement of drug-eluting stent in      the distal RCA.  She will continue on her aspirin, her Plavix,      statin, beta-blocker, and ACE inhibitor.  2. Hypertension.  The patient's blood pressure is very poorly      controlled, it is 180/112 today, however, that is down from her      past cardiology appointment where systolic pressure was above 200.      She does have severe LVH on her echocardiogram and has been      hypertensive for many years.  Today, we will discontinue her      atenolol, especially given her chronic kidney disease  We will      start her on carvedilol 12.5 mg b.i.d., which should be a better      blood pressure controlling medication than atenolol.  We will also      discontinue her amlodipine and start her on Procardia XL 90 mg      daily.  This also should be a better blood controlling medications      than amlodipine.  She will continue on her lisinopril 20 mg b.i.d.      Finally, to help maintain her potassium and also lower BP we will      start spironolactone 25 mg daily.  3. Hypokalemia.  The patient did presents to the emergency department      with some musculoskeletal pain earlier this month and her potassium      was found to be 2.6 at that time.  She was taking Lasix 60 b.i.d.      and she was not taking her potassium.  I am going to start her on      spironolactone 25 mg daily, and we will also look for a formulation      of potassium  that would be easier for her to take, and I will try      to have her start on potassium at 30 mEq daily.  We will have her      back in 2 weeks for a blood pressure check and to check a complete      metabolic panel and we will see what potassium control looks like      at that point.  4. Diastolic congestive heart failure.  The patient does appear very       mildly volume overloaded today.  Her JVP is very slightly above      normal and she does have some trace edema.  This should improve      with better control of her blood pressure.  Given her volume      status, I think continuing her on the Lasix for now despite      hypokalemia and attempting to replace her potassium with      spironolactone and a formulation of potassium chloride that she can      tolerate is probably the best plan.  I will see this patient back      in about a month and she will have her blood pressure checked in 2      weeks.     Loralie Champagne, MD  Electronically Signed    DM/MedQ  DD: 06/05/2008  DT: 06/06/2008  Job #: [id number]   cc:   Myriam Jacobson  Marijo Conception. Wall, MD, [facility]

## 2011-03-04 NOTE — Discharge Summary (Signed)
NAME:  [name], [name]                ACCOUNT NO.:  [id number]   MEDICAL RECORD NO.:  [id number]          PATIENT TYPE:  INP   LOCATION:  [id number]                         FACILITY:  [facility]   PHYSICIAN:  Marlene Bast, MDDATE OF BIRTH:  [birth year]   DATE OF ADMISSION:  08/15/2007  DATE OF DISCHARGE:  08/17/2007                               DISCHARGE SUMMARY   HOSPITAL COURSE:  [name] is a 50 year old woman with longstanding  severe hypertension and history of brain aneurysm status post clipping  in 1996, who presented to the hospital with  hypertensive urgency.  The  patient had been off of her blood pressure medications secondary to  inability to afford them for at least 4-5 days.  She was initially  admitted to the ICU and required nitroprusside drip, but she was  eventually moved to a telemetry unit and put back on her oral  medications.  It took 48 hours or so before her blood pressure really  started to come down.  At the time of discharge, it is still suboptimal.  Her last blood pressure was 152/82.  We discussed at length how the  patient could get the money for her medications.  She has prescriptions  and has refills on each of her medications, but just did not have the  money to get them filled.  The patient has Medicaid, so all of her  prescriptions are $3 a bottle, but still she and her family could not  come up with that money.  The patient said she recently lost her home.  Her daughter who is pregnant and has several kids has been unable to pay  her power bill, and they have no electricity at this moment.  She said  the money is just not there.  She does believe that she may be able to  collect much of the money from neighbors, but that process will take  several days.  After speaking to the social worker, we hope the  chaplain's office will be able to help the patient obtain her  medications tonight.   DISCHARGE DIAGNOSES:  1. Hypertensive urgency.  2. History of  brain aneurysm, status post clipping in 1996.  3. History of foot surgery.   DISCHARGE MEDICATIONS:  1. Norvasc 10 mg p.o. daily.  2. Atacand 32 mg p.o. daily.  3. The patient was previously on lisinopril and was maintained on      lisinopril 5 mg p.o. daily.  I think the Atacand will be a stronger      antihypertensive, so I am going to switch her to Atacand.  4. Lasix 20 mg p.o. daily.  5. Toprol XL 100 mg p.o. daily.  6. Clonidine.  The patient was previously on 0.2 mg p.o. b.i.d.  I am      going to increase that to 0.2 mg p.o. t.i.d.   FOLLOW UP:  The patient is to follow up at [facility].  She says that  she has one refill left on her medications which means she will to go  see them before the month is out.  I told her that I really want her to  do that so she can have her blood pressure checked in the near future  instead of the far future, so she is agreeable with that.  We are going  to give her refills.  She has at least one refill left on every  prescription she has.  We are making the change with the increased  clonidine and the Atacand instead of lisinopril as mentioned above.   ASSESSMENT:  1. Hypertensive emergency.  2. Tobacco abuse.  3. History of brain aneurysm.  Please note that on arrival, the      patient did have a CT scan of her head and it revealed no acute      intracranial abnormality.  The patient was having headaches with      her blood pressure being high.  4. Renal insufficiency.   DIAGNOSTICS:  She also had a chest x-ray on arrival that revealed a  heart that was borderline size, but no focal airspace abnormalities.   LABORATORY DATA:  The patient also had a TSH done which was within  normal limits.  She did rule out for MI by cardiac enzymes.  She does  have renal insufficiency with creatinine of 1.45 and BUN of 21.  Her  hemoglobin is normal at 12.6.  Her white count was normal at 6.9 with a  normal platelet count.   DISPOSITION:  She will be  discharged today.      Marlene Bast, MD  Electronically Signed     CVC/MEDQ  D:  08/17/2007  T:  08/18/2007  Job:  [id number]

## 2011-03-04 NOTE — H&P (Signed)
NAME:  [name], [name]                ACCOUNT NO.:  [id number]   MEDICAL RECORD NO.:  [id number]          PATIENT TYPE:  INP   LOCATION:  [id number]                         FACILITY:  [facility]   PHYSICIAN:  Durwin Nora, MDDATE OF BIRTH:  [birth year]   DATE OF ADMISSION:  12/16/2007  DATE OF DISCHARGE:                              HISTORY & PHYSICAL   PRIMARY CARE PHYSICIAN:  [facility].  No designated health care power  of attorney.  She is a full code.   CHIEF COMPLAINT:  Chest pain for three days.   HISTORY OF PRESENT ILLNESS:  This is a 49 year old African-American  female who noted substernal chest pain 9-10 over 10, crushing in nature,  nonradiating three days ago.  She presented to [facility] the day after and she was sent home as outpatient with  presumptive treatment for asthma .  However, the patient's chest pain  was waxing and waning.  The chest pain increases with exertion and is  relieved by rest, associated with shortness of breath and mild exertion  with palpitations and dizzy spells.  No diaphoresis, no syncopal  episode.  She does not have any abdominal pain.  She does experience  tingling in both arms.  No jaw or neck pain.  No history of chest  trauma.  No cough, no hemoptysis, and no fever.  The patient denies any  urinary or gastrointestinal symptoms.  She used to use Nexium for  gastroesophageal reflux disease, but she stopped a couple of months ago.  She continues to smoke half pack per day and she has been doing this for  the last 30 years.  There is also a family history of heart disease.   PAST SURGICAL HISTORY:  Cerebral aneurysm in 1996 status post clipping,  hypertension, gastroesophageal reflux disease.   SOCIAL HISTORY:  She is single.  She used to work at [employer].  She has  two children.  Smokes half pack per day for more than 30 years.  No  history of alcohol or illicit drug abuse.   FAMILY HISTORY:  Mother with coronary  artery disease, father with end-  stage renal disease and cardiac arrest.  Grandmother with diabetes and  heart disease, status post pacemaker.   ALLERGIES:  CODEINE, PENICILLIN.   MEDICATIONS:  1. Amlodipine 10 mg daily.  2. Atacand 32 mg daily.  3. Clonidine 0.2 mg t.i.d.  4. Lasix 20 mg daily.  5. Lisinopril 20 mg daily.  6. Nexium 40 mg daily.  7. Albuterol APAP 33/5 mg q.8 hours p.r.n.  8. Metoprolol 100 mg daily.  9. Ventolin inhaler two puffs q.6 hours p.r.n.   REVIEW OF SYSTEMS:  14 systems reviewed and pertinent positives as in  history of present illness.   PHYSICAL EXAMINATION:  GENERAL:  She is a middle-aged lady and she is  hyperventilating.  VITAL SIGNS:  Temperature 97.3, pulse 68, respiratory rate 20, blood  pressure 157/98.  HEENT:  Pupils equal, round, and reactive to light and accommodation.  She has no jaundice.  There is no cyanosis or clubbing.  Head is  normocephalic and atraumatic.  Oropharynx and nasopharynx are clear.  Mucous membranes are moist.  NECK:  Supple.  There is no elevated JVD and no thyromegaly.  HEART:  Sounds 1 and 2 are regular rate and rhythm.  CHEST:  Clinically clear.  No point tenderness.  ABDOMEN:  Soft and nontender.  No organomegaly.  Inguinal orifices are  patent.  NEUROLOGY:  She is alert and oriented to time, place, and person.  Power  is 5 in all limbs.  Cranial nerves II-XII grossly intact.  Peripheral  pulses present.  No pedal edema.  No clubbing or cyanosis.  No skin  rash.   LABORATORY DATA:  Available labs show sodium 140, potassium 3.5,  chloride 107, BUN 10, creatinine 1.4, WBC 7.8, hematocrit 42, platelet  count 258.  Troponin is 0.09.  D-dimer is 0.43.  Sodium 137.  BNP is  206.   EKG shows T wave depression in leads 1, aVL, V5 through V6.  It is noted  in the history that the chest pain was partly relieved with aspirin and  nitroglycerin given in the emergency room.  Also note EKG shows normal  sinus rhythm  at 64 with left ventricular hypertrophy and prolonged QT.   ASSESSMENT:  1. Chest pain rule out acute coronary syndrome.  2. Mild congestive heart failure decompensation.  3. Hypokalemia on diuretic.  4. Moderate hypertension.  5. Anxiety.  6. Tobacco abuse.   PLAN:  The patient is to be admitted to a telemetry bed.  Diet is to be  heart healthy.  Activity is to be bed rest.  IV fluid half normal saline  at 25 mL an hour to keep vein open.  Serial cardiac enzymes, EKG, and  troponins x3.  We will obtain a calcium and magnesium phosphate level.  Thyroid function, fasting lipids, homocysteine, and coagulation  parameters.  NPO from midnight for possible stress echocardiogram in the  morning.  Obtain hemoccult and two-dimensional echocardiogram.  Get a  stat CT of brain to follow-up aneurysm prior to starting  anticoagulation.  Morphine 2 mg IV q.6 hours p.r.n.  Nitropaste half  inch q.6 hours, metoprolol 50 mg b.i.d., clonidine 0.4 mg q.8 hours  p.r.n. if blood pressure greater than 160/110, Xanax 0.5 mg b.i.d.,  Lasix 40 mg p.o. daily, KCL 20 mEq daily, lisinopril 20 mg  daily,nicotine patch 21mg   per day.  Consult for tobacco cessation and  cardiology consult .  Lovenox 40 mg subcu daily.  Phenergan 12.5 mg IV  q.8 hours, aspirin 81 mg daily, Protonix 40 mg IV daily, O2 2 liters  nasal cannula to keep saturations greater than 90.  Check other studies  and blood pressure q.a.m.  Call M.D. if blood pressure is greater than  160/110 or less than 90/60.      Durwin Nora, MD  Electronically Signed     MIO/MEDQ  D:  12/16/2007  T:  12/17/2007  Job:  [id number]

## 2011-03-04 NOTE — Assessment & Plan Note (Signed)
[facility] OFFICE NOTE   ZHARICK, GALDO                         MRN:          [id number]  DATE:09/19/2008                            DOB:          09-Jun-1961    PRIMARY CARE PHYSICIAN:  [facility].   HISTORY OF PRESENT ILLNESS:  This is a 50 year old with a history of  coronary artery disease and severe hypertension who presents to  Cardiology Clinic for re-evaluation.  Since last appointment, the  patient's blood pressure medicine has been titrated.  Her blood pressure  is actually quite good today at 114/76.  She said she took all her  medications.  We did check her cholesterol and her LDL was 120, so we  increased her Zocor up to 80 mg daily.  She does continue to complain of  constant left shoulder pain.  This pain is worse with abduction.  She is  unable to abduct her left arm to beyond 90 degrees.  It is quite painful  if she moves it up higher.  She developed this pain in her shoulder  after moving boxes this summer into her new house.  The patient denies  any chest pain or discomfort.  She does continue to smoke a half a pack  a day.  She says she tried to get nicotine patches; however, her  insurance will not cover it for her.  Finally, the patient states that  she does not have any dyspnea on exertion.  She is able to climb up a  flight of steps without any trouble and do all her activities with no  shortness of breath.   MEDICATIONS:  1. Potassium chloride 30 mEq twice a day.  2. Lisinopril 40 mg daily.  3. Coreg 12.5 mg b.i.d.  4. Nifedipine XR 120 mg daily.  5. Spironolactone 25 mg daily.  6. Zocor 80 mg daily.  7. Aspirin 81 mg daily.  8. Lasix 60 mg b.i.d.  9. Plavix 75 mg daily.  10.Hydralazine 25 mg 3 times a day.   PAST MEDICAL HISTORY:  1. Hypertension.  2. Hypercholesterolemia.  3. Tobacco abuse.  The patient smokes half a pack a day.  She has been      able to stop smoking in  the past using nicotine patches.  4. Coronary artery disease.  The patient had an ST-elevation MI in      February 2009.  Left heart catheterization at that time showed a      70% distal LAD lesion that was hazy consistent with a plaque      rupture.  She had a 50% distal circumflex stenosis and 95% distal      RCA stenosis.  She did have a drug-eluting stent placed in her      distal RCA.  5. Chronic kidney disease.  The patient's most recent creatinine was      1.6 in October 2009.  6. Remote intracranial hemorrhage.  7. Diastolic congestive heart failure.  The patient's most recent echo      was in February 2009 with an EF  of 60% and severe LVH.  The patient      did have evidence of diastolic dysfunction.  The RV appeared      normal.   ALLERGIES:  The patient is allergic to PENICILLIN.   SOCIAL HISTORY:  The patient lives alone.  She does play a significant  role in caring for her 8 grandchildren.  She smokes about half a pack a  day, does not use any drugs.   PHYSICAL EXAMINATION:  VITAL SIGNS:  Blood pressure is 114/76, pulse is  65 and regular, weight is 165 pounds which is down from 166 pounds at  last appointment.  GENERAL:  This is an [place] American female in no apparent distress.  NEUROLOGIC:  Alert and oriented x3.  Normal affect.  NECK:  There is no JVD.  There is no thyromegaly or thyroid nodule.  LUNGS:  Clear to auscultation bilaterally with normal respiratory  effort.  CARDIOVASCULAR:  Heart regular.  S1 and S2.  There is an S4.  There is  no murmur.  There is no peripheral edema.  There is no carotid bruit.  ABDOMEN:  Soft and nontender.  No hepatosplenomegaly.  MUSCULOSKELETAL:  The patient is unable to abduct her left arm above 90  degrees.  There is significant pain if she tries to move it higher   ASSESSMENT AND PLAN:  This is a 50 year old with a history of  hypertension and coronary artery disease, who presents to Cardiology  Clinic for continued  followup of her blood pressure and her coronary  artery disease.  1. Coronary artery disease.  The patient is stable with no ischemic      symptoms.  She is status post placement of a drug-eluting stent in      the distal right coronary artery.  She will continue on her      aspirin, Plavix, statin, beta-blocker, and ACE inhibitor.  2. Hypertension.  The patient's blood pressure is actually under      excellent control when she takes all her medications.  I did      encourage her to continue taking all her medications daily.  She      does have severe left ventricular hypertrophy on her echo and has      been hypertensive for many years.  We will not make any changes in      her blood pressure medications today.  3. Diastolic congestive heart failure.  The patient does appear      euvolemic on exam today.  I think that it is reasonable to decrease      her Lasix dose down to 40 mg b.i.d. today.  4. Hypercholesterolemia.  The patient's LDL was 120, and we did      increase her Zocor to 80 mg daily.  We will have her back in a      couple of months for a lipid check.  5. Smoking.  I did encourage the patient to stop smoking.  We talked a      long time about nicotine patches.  She will go ahead and try to buy      those over the counter, and hopefully she will save enough money      with stopping smoking to afford this.  6. Musculoskeletal:  I do worry that the patient has a rotator cuff      injury and may be developing a frozen shoulder on the left.  I am  going to get plain films of the shoulder and refer her to      Orthopedics for evaluation.  She does need to avoid nonsteroidal      anti-inflammatory drugs due to her chronic kidney disease as well      as her coronary disease.  I will prescribe her with tramadol for      her pain.    Loralie Champagne, MD  Electronically Signed   DM/MedQ  DD: 09/19/2008  DT: 09/19/2008  Job #: [id number]   cc:   Myriam Jacobson

## 2011-03-04 NOTE — Assessment & Plan Note (Signed)
[facility]                            CARDIOLOGY OFFICE NOTE   REECE, CHARLES                         MRN:          [id number]  DATE:02/08/2008                            DOB:          10-27-1960    PRIMARY CARE PHYSICIAN:  [facility]   REASON FOR VISIT:  Followup coronary artery disease.   HISTORY OF PRESENT ILLNESS:  I saw Ms. Meinhardt as an inpatient consultation  on the hospitalist service back in February.  She presented at that time  with evidence of a non-ST-elevation myocardial infarction in the setting  of hypertensive urgency and renal insufficiency.  She ultimately  underwent a cardiac catheterization by Dr. Olevia Perches with findings of a 70%  distal stenosis within the left anterior descending that looked to be  probable plaque rupture that was managed medically, as well as a 95%  stenosis in the distal right coronary artery that was treated with a  drug-eluting stent.  She was discharged home and has had followup  through [facility] since then. She is not reporting any problems with  angina.  Her blood pressure is still poorly controlled today.  She  reports that she has been compliant with her medications.   Her electrocardiogram today shows sinus rhythm with left atrial  enlargement and decreased anteroseptal R wave progression.   She is on no statin therapy at this time.  Her LDL cholesterol was 107  during hospitalization.   ALLERGIES:  PENICILLIN.   MEDICATIONS:  1. Metoprolol 50 mg p.o. b.i.d.  2. Lisinopril 20 mg p.o. b.i.d.  3. Lasix 40 mg p.o. daily.  4. Potassium 30 mEq p.o. daily.  5. Enteric-coated aspirin 325 mg p.o. daily.  6. Plavix 75 mg p.o. daily.  7. Sublingual nitroglycerin 0.4 mg p.r.n.   REVIEW OF SYSTEMS:  As described in History of Present Illness. She has  occasional headaches.  This is not new for the patient.  She has had no  bleeding problems on Plavix and aspirin.  Has occasional lower extremity  edema.   PHYSICAL EXAMINATION:  Blood pressure checked by me a 182/115 the right  arm; weight 177 pounds, heart rate 67 and regular.  The patient is  comfortable and in no acute distress.  HEENT:  Conjunctivae was normal.  Oropharynx clear.  NECK:  Supple.  No elevated jugular venous pressure; no loud bruits or  thyromegaly.  LUNGS:  Clear without labored breathing.  CARDIAC:  Exam reveals a regular rate and rhythm.  No pericardial rub.  No S3 gallop.  ABDOMEN:  Soft, nontender.  No obvious bruits.  EXTREMITIES:  Exhibit trace edema, nonpitting; distal pulses 2+.  SKIN:  Warm and dry.  MUSCULOSKELETAL:  No kyphosis noted.  NEUROPSYCHIATRIC: The patient alert and oriented x3.  Affect is  appropriate.   IMPRESSIONS AND RECOMMENDATIONS:  1. Coronary artery disease as outlined above status post drug-eluting      stent placement to the distal right coronary artery by Dr. Olevia Perches      in February.  She has otherwise been managed medically and had a  normal ejection fraction of 60% by followup echocardiography.  She      is not experiencing any angina at this point and is on dual      antiplatelet therapy which she will continue optimally for least a      year given her drug-eluting stent.  2. Hypertension, poorly controlled.  The patient states that this has      been a problem for several years.  Our plan is to place her on      Norvasc 10 mg daily in addition to her present regimen.  She will      otherwise continue to follow up with [facility].  We will see back      over the next 3 months.  3. Known renal sufficiency.  Creatinine was 1.4 during hospital stay.      Peak creatinine was 1.5 with a low of 1.2.  4. Hyperlipidemia with LDL cholesterol 107.  Will add simvastatin 20      mg p.o. nightly and will plan followup lipid profile, liver      function tests and next 3 months.     Satira Sark, MD  Electronically Signed    SGM/MedQ  DD: 02/08/2008  DT: 02/08/2008   Job #: [id number]   cc:   Myriam Jacobson

## 2011-03-04 NOTE — Assessment & Plan Note (Signed)
[facility] OFFICE NOTE   [name], [name]                         MRN:          [id number]  DATE:07/14/2008                            DOB:          [birth year]    PRIMARY CARE PHYSICIAN:  [facility].   This is a 50 year old with a history of coronary artery disease and  severe hypertension who presents to Cardiology Clinic for reevaluation.  At her last appointment, we did increase her Lasix because she was  mildly short of breath and volume overloaded.  She states that her  shortness of breath has significantly resolved.  She can climb a flight  of stairs and do housework and walk on flat ground without any  significant shortness of breath.  She has no orthopnea, no PND, and no  episodes of lightheadedness or syncope.  She has no chest pain.  She  does have chronic left shoulder pain that is worse when she moves her  arm around.  This has been thought to be musculoskeletal in the past.  Her blood pressure is high today at 186/108.  She states, however, that  she has not taken any of her medications this morning before coming into  the office.   PAST MEDICAL HISTORY:  1. Hypertension, poorly controlled.  2. Hypercholesterolemia.  3. Tobacco abuse.  The patient smokes half pack a day.  She use to      smoke in the past.  She states that she was able to stop in the      past using nicotine patches.  4. Coronary artery disease.  The patient had an ST elevation MI in      February 2009, left heart catheterization at that time showed a 70%      distal LAD lesion that was hazy, consistent with a plaque rupture.      She had a 50% distal circumflex stenosis and 95% distal RCA      stenosis.  She did have a drug-eluting stent placed in her distal      RCA.  5. Chronic kidney disease.  The patient's most recent creatinine was      1.6, done in August.  6. Remote intracranial hemorrhage.  7. Diastolic congestive  heart failure.  The patient's most recent echo      was in February 2009, with an EF of 60% and severe LV hypertrophy.      The patient did have evidence of diastolic dysfunction.  The RV      appeared normal.   ALLERGIES:  The patient is allergic to PENICILLIN.   MEDICATIONS:  1. Lisinopril 20 mg b.i.d.  2. Coreg 12.5 mg b.i.d.  3. Potassium chloride 30 mEq daily.  4. Aspirin 81 mg daily.  5. Lasix 60 mg b.i.d.  6. Zocor 20 mg daily.  7. Plavix 75 mg daily.  8. Nifedipine extended release 90 mg daily.  9. She states that she is taking spironolactone 25 mg daily, however,      she did not bring this medication with her, so I am not sure  if she      is actually taking it.   SOCIAL HISTORY:  The patient lives alone.  Now, she does play  significant role in caring for 8 grandchildren.  She has resumed smoking  half pack a day.   PHYSICAL EXAMINATION:  VITAL SIGNS:  Blood pressure 186/108, last time  it was 180/112; weight is 166 pounds down from 169 at last appointment;  heart rate is 73 and regular.  GENERAL:  This is a [place] American female, in no apparent distress.  Neurologically, alert and oriented x3.  Normal affect.  NECK:  There is no JVD.  There is no thyromegaly or thyroid nodule.  LUNGS:  Clear to auscultation bilaterally.  HEART:  Regular S1 and S2.  There is an S4.  There is no murmur.  There  is no peripheral edema.  There is no carotid bruit.  ABDOMEN:  Soft and  nontender.  No hepatosplenomegaly.  EXTREMITIES:  No clubbing or cyanosis.   ASSESSMENT AND PLAN:  This is a 50 year old with a history of  hypertension and coronary artery disease who presents to Cardiology  Clinic for continued followup of her blood pressure.  1. Coronary artery disease.  The patient is stable with no ischemic      symptoms.  She is status post placement of a drug-eluting stent in      the distal right coronary artery.  She will continue on her      aspirin, Plavix, statin, beta  blocker, and ACE inhibitor.  2. Hypertension.  The patient's blood pressure is poorly controlled.      She has taken none of her medicines today, but is 186/108 and I do      doubt that it is at goal even when she did indeed take her      medicines last night.  She does have severe left ventricular      hypertrophy on her echo and has been hypertensive for many years.      Today, we will plan on increase her Coreg to 25 mg twice a day and      her nifedipine XR 120 mg daily.  She states that she will go home      and check to make sure that she has been taking her spironolactone.  3. Hypokalemia.  The patient's last potassium was 3.3.  She has been      taking her potassium pill.  She is cutting them up into small      pieces and taking gradually.  She was supposed to start      spironolactone, I am not sure if she is actually taking it or not.      We will check a Chem-7 today.  4. Diastolic congestive heart failure.  The patient appears euvolemic      today.  The symptoms are improved.  We will continue her on the      current dose of Lasix for now and check a chemistry today.  5. Hypercholesterolemia.  The patient's LDL is 104, our goal is less      than 70.  We will increase the Zocor to 40 mg daily and have her      back in 3 months for a lipid check.   Most recent labs in May 28, 2008, sodium 143, potassium 3.3, BUN 17,  creatinine 1.6, LDL 104, and HDL 30.7.     Loralie Champagne, MD  Electronically Signed    DM/MedQ  DD: 07/14/2008  DT: 07/14/2008  Job #: [id number]   cc:   Fay Records, MD, [facility]

## 2011-03-04 NOTE — Discharge Summary (Signed)
NAME:  [name], [name]                ACCOUNT NO.:  [id number]   MEDICAL RECORD NO.:  [id number]          PATIENT TYPE:  INP   LOCATION:  [id number]                         FACILITY:  [facility]   PHYSICIAN:  Durwin Nora, MDDATE OF BIRTH:  Jan 08, 1961   DATE OF ADMISSION:  12/16/2007  DATE OF DISCHARGE:  12/18/2007                               DISCHARGE SUMMARY   DISCHARGE DIAGNOSES:  1. Non-ST elevation myocardial infarction.  2. Hypertension emergency, controlled.  3. Congestive heart failure decompensation.  4. Ischemic cardiomyopathy.  5. Tobacco abuse.  6. Anxiety disorder.  7. Medical noncompliance.  8. History of cerebral aneurysm, status post clipping.  9. Dyslipidemia.  10.Hypokalemia, resolved.  11.Chronic renal insufficiency.  12.Mild dehydration.  13.Mild hyponatremia.   CONSULTATIONS:  Dr. Domenic Polite, [facility].   PROCEDURES:  Cardiac catheterization performed on this admission on  December 17, 2007, showing distal RCA disease, status post stenting.   RADIOLOGY:  1. CT brain of December 16, 2007, showed no acute abnormality.  2. A chest x-ray of December 16, 2007, showed prominent left ventricle      contour, no acute active disease.   HOSPITAL COURSE:  This 50 year old African American lady presented with  a 3-day history of substernal chest pain.  On presentation her troponin  was noticed to be elevated.  She was started on nitro paste, full dose  anticoagulation, and a cardiology consultation was sought.  The patient  was also continued on ACE inhibitors and beta-blocker and she had a  cardiac catheterization and this showed RCA disease.  The patient was  stented.  Her  BNP was mildly elevated at presentation.  She was started  on diuretics.  The patient's shortness of breath and chest pain were  ameliorated.  Cardiac catheterization was performed by Dr. Eustace Quail.  Hypokalemia was replenished.  A lipid panel showed she had dyslipidemia  with an HDL  of 27, an LDL of 107.  She has been started on Statins.  The  patient is asymptomatic and strongly counseled on the need for  medication compliance and needs to cease tobacco smoking.   DISCHARGE CONDITION:  Stable.   She is to follow up with:  1. [facility] in 3-5 days.  2. Dr. Domenic Polite cardiology in 1-2 weeks.   DISCHARGE MEDICATIONS:  1. Metoprolol 50 mg b.i.d.  2. Lisinopril 20 mg b.i.d.  3. Lasix 40 mg daily.  4. Potassium chloride 30 mEq daily.  5. Aspirin 325 mg daily.  6. Plavix 75 mg daily.  7. Nitroglycerin 0.4 mg sublingual p.r.n.   PHYSICAL EXAMINATION:  GENERAL:  She is a middle-aged lady not in acute  distress, ambulating without support.  VITAL SIGNS:  Temperature 97.4, pulse 78, respiratory rate 18, blood  pressure 150/80.  HEENT:  Pupils equal, reactive to light and accommodation.  Head is  atraumatic, normocephalic.  Mucous membranes are moist.  Oropharynx and  nasopharynx are clear.  NECK:  Supple.  No elevated JVD or thyromegaly.  No carotid bruits.  HEART:  Sounds 1 and 2 regular rate and rhythm.  CHEST:  Clear.  ABDOMEN:  Benign.  NEUROLOGIC:  She is alert and oriented to time, place, and person.  Power is 5 in all limbs.  EXTREMITIES:  Peripheral pulses present.  No pedal edema.  There is no  skin rash and there is no joint swelling.   LABORATORY:  Show a sodium of 133, potassium 2.9, chloride 104,  bicarbonate 24, glucose 112, BUN 11, creatinine 1.5.  The WBC is 6,  hematocrit 36, platelet count is 235.   Discharge greater than 30 minutes.      Durwin Nora, MD  Electronically Signed     MIO/MEDQ  D:  12/18/2007  T:  12/19/2007  Job:  [id number]   cc:   Alphia Kava, MD

## 2011-03-07 NOTE — Discharge Summary (Signed)
NAME:  [name], [name]                ACCOUNT NO.:  [id number]   MEDICAL RECORD NO.:  [id number]          PATIENT TYPE:  INP   LOCATION:  0154                         FACILITY:  [facility]   PHYSICIAN:  Corinna L. Conley Canal, MDDATE OF BIRTH:  06/28/1961   DATE OF ADMISSION:  06/05/2005  DATE OF DISCHARGE:  06/11/2005                                 DISCHARGE SUMMARY   DIAGNOSES:  1.  Hypertensive emergency.  2.  Chronic renal insufficiency.  3.  Hypokalemia.  4.  Anxiety disorder.  5.  Tobacco abuse, counseled against.  6.  History of intracranial hemorrhage.  7.  Medical noncompliance.   DISCHARGE MEDICATIONS:  1.  Hydrochlorothiazide 25 mg a day.  2.  Clonidine 0.3 mg p.o. b.i.d.  3.  Labetalol 600 mg p.o. b.i.d.  4.  Lisinopril 40 mg a day.  5.  Norvasc 10 mg a day.   FOLLOW UP:  With [facility] in one-two weeks.   ACTIVITY:  Ad lib.   DIET:  Low salt.   CONDITION:  Stable.  She is encouraged to quit smoking.   PERTINENT LABORATORIES:  Cardiac enzymes negative.  Urine drug screen  negative.  Initial creatinine was 1.6.  It peaked at 1.7 and at discharge  was 1.3.  Potassium on admission was 3.3 and was adequately repleted.  The  rest of her C-MET was unremarkable.  D-dimers are 0.57.  Coagulation panel  normal.  CBC normal.   SPECIAL STUDIES AND RADIOLOGY:  She had serial EKGs, which showed a normal  sinus rhythm.  LVH by voltage with strain versus lateral ischemia.  Prolonged QT.  She had serial EKGs, which continued to show the flipped T  waves in the inferior lateral leads.  A CT of the brain was stable and  nothing acute.  A chest x-ray on admission showed minimal basilar  atelectasis and/or scarring with cardiomegaly.  A repeat chest x-ray showed  the continued basilar atelectasis versus infiltrate.   HISTORY/HOSPITAL COURSE:  [name] is a 50 year old black female with severe  hypertension and a history of intracranial hemorrhage, tobacco abuse, and  noncompliance  who presented to the emergency room with a blood pressure of  260/120.  She was complaining of a headache and blurry vision.  She has a  history also of renal insufficiency.  Her vital signs otherwise were normal  and she had a fairly unremarkable exam except for a systolic ejection  murmur.  Please see Dr. Lyman Speller H&P for complete details.  She was  admitted to the step down unit for a hypertensive emergency,  started on a  Nipride drip, started on a Catapress patch,  and IV Lopressor.  The patient  admitted to being noncompliant with her medications.  Her Nipride drip was  switched to a nitroglycerin drip.  Medications were added and required up  titration significantly.  The patient reported that part of her problem was  that she had no job and had no money for her medications.  Her affect was  quite flat and she expressed some frustration with her current situation.  She  also had an episode during her hospitalization, where she complained of  substernal chest pain, shortness of breath, and panic.  She reported that it  was similar to her previous episodes of panic attacks.  A workup was  negative for a cardiac or pulmonary source and the symptoms resolved with  benzodiazepine alone.  She may, therefore, benefit from an SSRI for her  anxiety disorder and I suspect she may have an element of depression as  well.  However, I will defer this to outpatient evaluation.  She was  referred to [facility] and a social worker has arranged indigent  medications for her.  She was encouraged to quit smoking.  At the time of  discharge her blood pressures were ranging 160-170/about 80.  She may need  further titration of her medications, but she is currently stable for  discharge.  Total time on the day of discharge is 40 minutes.      Corinna L. Conley Canal, MD  Electronically Signed     CLS/MEDQ  D:  06/11/2005  T:  06/11/2005  Job:  [id number]   cc:   [facility]

## 2011-03-07 NOTE — Discharge Summary (Signed)
NAME:  [name], [name]                ACCOUNT NO.:  [id number]   MEDICAL RECORD NO.:  [id number]          PATIENT TYPE:  INP   LOCATION:  [id number]                         FACILITY:  [facility]   PHYSICIAN:  Debbora Lacrosse, M.D.      DATE OF BIRTH:  1960-12-30   DATE OF ADMISSION:  08/06/2004  DATE OF DISCHARGE:  08/08/2004                                 DISCHARGE SUMMARY   PRIMARY CARE PHYSICIAN:  [facility].   DISCHARGE DIAGNOSES:  1.  Hypertensive urgency.  2.  History of brain aneurysm, status post clipping in 1994.  3.  Status post hysterectomy.  4.  Status post left foot surgery with incision and drainage of the left      abscess in the left foot.   DISCHARGE MEDICATIONS:  1.  Hydrochlorothiazide 25 mg one p.o. daily.  2.  Lisinopril 10 mg one p.o. daily.  3.  Clonidine 0.1 mg one p.o. b.i.d.   FOLLOW-UP APPOINTMENTS:  The patient is to follow up in the outpatient  clinic with Dr. Debbora Lacrosse on August 15, 2004 at 1:30 p.m.  At that visit,  a blood pressure check will be done as well as BMET to be obtained to check  the creatinine as on discharge, the creatinine was 1.6 and the patient is on  Lisinopril.  If creatinine goes up significantly during that visit, we have  to stop the ACE inhibitor and maybe CT of the abdomen to look at the renal  arteries.   PROCEDURES PERFORMED IN THE HOSPITAL:  1.  The 2D echo performed on August 07, 2004 showed left ventricular EF      estimated to be 60%, no regional wall abnormalities, left ventricular      wall thickness increased and there is mild mitral valve regurgitation      and left atrium was dilated.  2.  The head CT was done on August 06, 2004 showing previous cerebellar      arterial aneurysm surgery, which had two aneurysm clips in and around      the right middle cerebral artery, no mass or intracranial hemorrhage.  3.  Chest x-ray showed CHF with interstitial edema.  4.  The x-ray of the T-spine showed on the 18th  of October, 2005 no evidence      of any bony injury in the thoracic spine.   HOSPITAL ADMISSION:  The patient was admitted on August 06, 2004, 50 year  old African-American female with a past medical history significant for  hypertension, brain aneurysm, status post surgery, who came to the urgent  care with complaints of back pain.  At the urgent care, when her blood  pressure was taken, it was found to be systolic of [id number] by diastolic of 0000000.  The patient was given sublingual nitroglycerin and then, sent to the ER.  The patient does complains of some shortness of breath on exertion, off and  on for the past few months.  Also complains of some cough, off and on, for  the past one month, no fever.  In regard to  the back pain, it occurred about  three days ago as she was moving furniture and the patient has been taking  Aleve and Tylenol for pain, which has been helping it.   ALLERGIES:  AMPICILLIN, PENICILLIN cause hives.   SUBSTANCE ABUSE USE:  Current smokes a half pack a day for the past 15  years.   SOCIAL HISTORY:  She is married, lives with her husband.  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 She has a CAT and dog, as well as two daughters  and six grandchildren.   FAMILY HISTORY:  Her mother died of coronary artery disease at 42 and  hypertension. Father died at 49 of coronary artery disease and hypertension.  She has four sisters and one brother.   PHYSICAL EXAMINATION:  VITAL SIGNS: On the date of admission, pulse 70 per  minute, blood pressure 235/128, temperature 98, respiratory rate 20, O2 sats  of 100% on room air.  GENERAL: She was awake, alert and oriented.  EYES:  Extraocular movements intact.  EARS:  Normal.  THROAT:  No exudate.  NECK:  No JVD, no thyromegaly.  RESPIRATORY: Clear to auscultation bilaterally with no rhonchi or  crepitations.  CARDIOVASCULAR:  Regular rate and rhythm, no murmurs, rubs or gallops.  ABDOMEN:  Soft, bowel sounds are heard.   EXTREMITIES:  No edema, cyanosis or clubbing.  2+ pedal pulses bilaterally.  NEUROLOGICAL:  She was __________ intact.   LABORATORY DATA:  On the day of admission was sodium 137, potassium 3.0,  chloride 104, bicarb 27, glucose 143, BUN 10, creatinine 1.4, total  bilirubin 0.5, alkaline phosphatase 58, AST 17, ALT 12.  Total protein 6.7,  albumin 3.5, calcium 8.4.  CBC on date of admission, WBC 7, hemoglobin 13.4,  hematocrit 39, and platelets of 190,000.  Cardiac enzymes were negative  throughout the day.  BNP was elevated at 984.  UDS was negative.  Microalbumin 7.13.  Alcohol less than 5.   HOSPITAL COURSE BY ACTIVE PROBLEMS:  1.  Hypertensive urgency:  In regards to this, the patient was given one      dose of labetalol IV in the ER and brought up to the floor. The blood      pressure had actually dropped to about 0000000 systolic over 90.  Initial      drugs of choice were clonidine 0.1 mg b.i.d., Lisinopril and      hydrochlorothiazide.  The patient overnight blood pressure remained      stable and in the a.m., we decided to switch her over to      hydrochlorothiazide 25 mg, Lisinopril 10 mg and stopped the clonidine.      It was noted that her heart rate was in the 60s and therefore, a beta      blocker could not be used.  It was finally decided that on discharge,      she will go home on hydrochlorothiazide 25 mg, Lisinopril 10 mg and      clonidine 0.1 mg b.i.d.  We have avoided the beta blocker secondary to      heart rate.  A 2D echo was done which showed diastolic dysfunction with      an EF of about 60%.  This will be followed as an outpatient.  Compliance      issues were talked about with the patient and emphasized that it is very      important that she does take her medications.  2.  Back pain:  She had plane films of her thoracic spine, which were      negative. We gave her hot compresses as well as Tylenol p.r.n. for the     back pain.  On the day of discharge, the back  pain has completely      disappeared.  3.  Tobacco abuse:  The patient was given nicotine patches while in the      hospital and has asked for them on discharge.  A prescription was      written for her.   LABS ON DAY OF DISCHARGE:  Sodium 134, potassium 4.6, chloride 106, bicarb  24, BUN 14, creatinine 1.6 and glucose of 100.  Hemoglobin 11.6, hematocrit  34.7, white count 5.6 and platelets of 189,000.       TS/MEDQ  D:  08/08/2004  T:  08/08/2004  Job:  [id number]   cc:   [facility]

## 2011-03-07 NOTE — H&P (Signed)
NAME:  [name], [name]                ACCOUNT NO.:  [id number]   MEDICAL RECORD NO.:  [id number]          PATIENT TYPE:  INP   LOCATION:  0160                         [facility]AC[facility]:  [facility]   PHYSICIAN:  Benito Mccreedy, M.D.DATE OF BIRTH:  March 28, 1961   DATE OF ADMISSION:  08/11/2005  DATE OF DISCHARGE:                                HISTORY & PHYSICAL   PROBLEM LIST:  1.  Isolated hypertension/hypertensive urgency.  2.  Headaches, secondary to diagnosis #1.  3.  History of chronic renal insufficiency, anxiety disorder, intracranial      aneurysm status post intracranial hemorrhage, and medication      noncompliance.   CHIEF COMPLAINT:  Headaches.   The patient presented to the ED with complaints of headaches. Apparently she  had not been taking her medicines for over a week because her supplies have  been finished and she does not have money to purchase any more medications.  She has had this for 2 days without any associated visual changes or  extremity weakness. No other significant neurologic symptomatology. No chest  pain, no shortness of breath. In the emergency room, the patient was noted  to have a very significantly elevated blood pressure up to 123456 systolic,  with diastolic of 0000000. CT scan of the head was obtained. It was negative for  any acute intracranial process. She was given IV Lopressor and her blood  pressure monitored in the ED. She also received Dilaudid for headaches. Her  blood pressure remained high and the hospitalist service was asked to  evaluate her for admission. The patient was started on labetalol drip and  was seen by the hospitalist service.   PAST MEDICAL HISTORY:  As noted above.   MEDICATION HISTORY:  The patient's medicines include:  1.  Hydrochlorothiazide 25 daily.  2.  Labetalol 600 mg b.i.d.  3.  Lisinopril 40 mg daily.  4.  Norvasc 10 mg daily.  5.  Clonidine 0.3 mg b.i.d.   The patient is allergic to PENICILLIN and ALPRAZOLAM.   She has  family history of hypertension.   SOCIAL HISTORY:  The patient lives with her sister. She denies current  cigarette smoking or any illicit drug use.   REVIEW OF SYSTEMS:  Significant positives and negatives are as noted above;  otherwise, unremarkable.   PHYSICAL EXAMINATION:  VITAL SIGNS:  Blood pressure was 240/128, pulse 82,  respirations 18, temperature 97.8 degrees Fahrenheit, pulse oximetry of 97%  on room air.  GENERAL:  The patient was looking comfortable, not in distress.  HEENT:  Pupils are equal, round, and reactive to light. Extraocular movement  is intact. Oropharynx moist.  NECK:  Supple, no JVD.  LUNGS:  Clear to auscultation.  CARDIAC:  Regular, no gallops or murmur.  ABDOMEN:  Soft, nontender.  EXTREMITIES:  No cyanosis, no edema.  CENTRAL NERVOUS SYSTEM:  Nonfocal.   LABORATORY WORK:  CT scan as noted above. The patient had blood work done  with wbc count of 5.6, hemoglobin 15.1, hematocrit 45.5, platelet count 227.  Sodium 137, potassium 3.2, chloride 102, CO2 24, glucose 91, BUN 10,  creatinine 1.6, calcium 9.4. A 12-lead EKG showed prolonged QT interval with  normal sinus rhythm and left ventricular hypertrophy, probably secondary to  hypertensive induced. Urine drug screen was positive for opiates. UA was  negative for any urinary tract infection.   IMPRESSION:  Isolated hypertension/hypertensive urgency. The patient does  not seem to have any new end-target organ damage. She has been on multiple  antihypertensive medications including clonidine, and will be continued on  labetalol drip to try to keep systolic blood pressure around 160. We will  resume the patient's medications, especially the clonidine. Given her  history of noncompliance, it may be better to titrate her over time off  clonidine, or at least put her on clonidine patch which may help some. The  patient will need to be seen by social work for any medication assistance  that she may be  qualified for. The patient will be admitted to a step-down  bed for titration of her labetalol drip.      Benito Mccreedy, M.D.  Electronically Signed     GO/MEDQ  D:  08/12/2005  T:  08/12/2005  Job:  [id number]

## 2011-03-07 NOTE — H&P (Signed)
NAME:  [name], [name]                ACCOUNT NO.:  [id number]   MEDICAL RECORD NO.:  [id number]          PATIENT TYPE:  EMS   LOCATION:  ED                           FACILITY:  [facility]   PHYSICIAN:  Annita Brod, M.D.DATE OF BIRTH:  [birth year]   DATE OF ADMISSION:  06/05/2005  DATE OF DISCHARGE:                                HISTORY & PHYSICAL   PRIMARY CARE PHYSICIAN:  She attends the [facility].   CHIEF COMPLAINT:  Headache.   The patient is a 50 year old African-American female with past medical  history of hypertensive urgency, secondary intracranial hemorrhage, multiple  tobacco abuse, and medical noncompliance. She presents to the emergency room  complaining of headache and found to have a blood pressure with a systolic  of [id number]. The patient admits that she has not been consistent with taking her  medications. She tells me she takes her pill this morning but she has not  been taking any of them for the past few months. She says that she has been  out of work and has not been able to afford any of her medications. She  noticed that over the last day she has had problems with increasing  blurriness of vision, headache. She has had no problems with aphagia or  numbness of the extremities or weakness but became concerned and even after  taking her medications had no relief today.   On arrival to the emergency room, the patient was noted to have a systolic  blood pressure of 260/120. The patient was given multiple doses of IV  Labetalol. Her blood pressure decreased down to 213/126. The heart rate was  67. She was given another 40 mg of Labetalol and started on a nitroglycerin  drip. Her blood pressure came down to as low as 190 but it started to climb  back up slightly to 200. She says her headache is feeling a little bit  better. She says her vision has improved as well. She denies any dysphagia,  chest pain, palpitations, shortness of breath, wheezing,  coughing, abdominal  pain, hematuria, dysuria, constipation, diarrhea, focal extremity numbness,  weakness or pain. Review of systems is otherwise negative.   The patient's past medical history includes intracranial hemorrhage, chronic  renal insufficiency, history of alcohol abuse in the past, although  currently she denies taking any. Tobacco abuse. 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 Status post hysterectomy. Status post left  foot surgery with incision and drainage of abscess.   MEDICATIONS:  1.  The patient is supposed to be on Clonidine 0.1 mg p.o. b.i.d.  2.  HCTZ 25 mg p.o. daily.  3.  Labetalol 200 mg b.i.d.  4.  Lisinopril 10 mg p.o. daily. She has not been taking her medications      consistently if at all.   ALLERGIES:  No known drug allergies.   SOCIAL HISTORY:  She admits to still smoking. She denies any drug use or  tobacco use.   FAMILY HISTORY:  Noncontributory.   PHYSICAL EXAMINATION:  VITAL SIGNS:  On admission,  initial blood pressure  260/120 now down to 198/127, respirations 20, O2 saturation 100% on room  air. Heart rate initially around 80 now down to 64. The patient is afebrile.  GENERAL:  The patient is feeling weak and tired but alert and oriented x3.  She is in minimal distress secondary to her headache.  HEENT:  Normocephalic and atraumatic. Her mucus membranes are slightly dry.  She has no carotid bruits.  HEART:  Regular rate and rhythm with a very subtle 2/6 systolic ejection  murmur.  LUNGS:  Clear to auscultation bilaterally.  ABDOMEN:  Soft, nontender, and nondistended. Positive bowel sounds.  EXTREMITIES:  No clubbing, cyanosis, but trace pitting edema.   LABORATORY DATA:  White count of 6.1. H&H 13.9 and 42. MCV of 88, platelet  count 218,000, no shift. PT 42.1, INR 1.1, PTT 32. Sodium 130, potassium  3.3, chloride 105, bicarbonate 25, BUN 16, creatinine 1.6, glucose 98. LFT's  are unremarkable. BNP is elevated at  518. CPK is 75.8, MB is 1.6, troponin I  0.05. Chest x-ray shows cardiac enlargement but no edema. CT of the head is  unremarkable for any acute bleeding process.   ASSESSMENT/PLAN:  Hypertensive emergency:  This is secondary to medical  noncompliance. We will get her blood pressure down with an Nipride,  intravenous Lopressor, and Clonidine 0.3 patch. I am holding her Lisinopril  and diuretic for now given her renal insufficiency which appears to be  slightly worsened than on previous labs. Hopefully with some mild  intravenous fluid, this will improve. Even though her B-type natriuretic  peptide is elevated, I think this is misleading secondary to her elevated  renal function and I do not think she is in active congestive heart failure.  I also will continue her monitor serial cardiac enzymes to make sure she is  having no cardiac ischemia. In addition, we will check a urine drug screen  as well.      Annita Brod, M.D.  Electronically Signed     SKK/MEDQ  D:  06/06/2005  T:  06/06/2005  Job:  [id number]   cc:   Patient's chart

## 2011-03-07 NOTE — Discharge Summary (Signed)
NAME:  [name], [name]                ACCOUNT NO.:  [id number]   MEDICAL RECORD NO.:  [id number]          PATIENT TYPE:  INP   LOCATION:  0160                         FACILITY:  [facility]   PHYSICIAN:  Payton Emerald, MD DATE OF BIRTH:  01/08/1961   DATE OF ADMISSION:  08/11/2005  DATE OF DISCHARGE:  08/15/2005                                 DISCHARGE SUMMARY   The patient does not have a primary care physician but plans to followup at  [facility].   DISCHARGE DIAGNOSES:  Hypertensive urgency. She has a history of brain  aneurysm status post clipping in 1994.   DISCHARGE MEDICATIONS:  1.  Hydrochlorothiazide 25 mg p.o. once daily.  2.  Lisinopril 40 mg p.o. 1 daily.  3.  Labetalol 800 mg p.o. b.i.d.  4.  Norvasc 10 mg p.o. daily.   HOSPITAL COURSE:  The patient presented to the emergency room on October 23  complaining of headaches. Currently she has not been taking her medicines  for over a week because she did not have money to purchase anymore medicine.  She did not have any neurological symptoms on presentation. In the emergency  room on presentation, her systolic blood pressures are elevated to 123456  systolic with diastolic at 0000000. A CAT scan was obtained, it was negative for  any intracranial process. She was started on IV Lopressor and IV labetalol  and was transferred to the step down unit for blood pressure control. The  patient's labetalol drip was rapidly titrated off and she was started on  labetalol p.o. The patient also came in on clonidine medicine b.i.d., that  was discontinued secondary to risk of rebound tachycardia for patient who  appears to have difficulties obtaining her medicine or noncompliance or  both. The patient's hospital course was uneventful. Labetalol p.o. was  started and labetalol IV was titrated off. Labetalol p.o. at discharge was  increased to labetalol 800 mg p.o. b.i.d. The patient's blood pressure on  discharge systolic is in the Q000111Q to  160's. The patient is to followup  immediately with [facility] for further blood pressure monitoring. The  patient also has a history of chronic renal insufficiency which remained  unchanged during this admission.   Other medical history is significant for a history of brain aneurysm status  post clipping in 1994, status post hysterectomy. There were no procedures  performed during the hospital.   FOLLOW UP:  The patient is to followup at [facility] clinic today. She was  unable to receive a 3 day supply of medicine because she has received  medicine in the past as told to me by the clinical nurse manager, therefore  the patient has an appointment today at [facility] where she will get  blood pressure medicine immediately.           ______________________________  Payton Emerald, MD     LNB/MEDQ  D:  08/15/2005  T:  08/15/2005  Job:  [id number]

## 2011-03-07 NOTE — Discharge Summary (Signed)
NAME:  [name], [name]                ACCOUNT NO.:  [id number]   MEDICAL RECORD NO.:  [id number]          PATIENT TYPE:  INP   LOCATION:  [id number]                         FACILITY:  [place]   PHYSICIAN:  Anne Fu, MD       DATE OF BIRTH:  May 26, 1961   DATE OF ADMISSION:  09/30/2004  DATE OF DISCHARGE:  10/03/2004                                 DISCHARGE SUMMARY   DISCHARGE DIAGNOSES:  1.  Hypertensive urgency.  2.  Chronic renal insufficiency.  3.  Chest pain.  4.  ETOH abuse.   DISCHARGE MEDICATIONS:  1.  Labetalol 100 mg p.o. b.i.d.  2.  Lisinopril 10 mg p.o. daily.  3.  HCTZ 25 mg p.o. daily.   BRIEF ADMITTING HISTORY AND PHYSICAL:  [name] is a 50 year old woman with  a history significant for hypertension, chronic renal insufficiency, who  presents with a headache and blood pressure of 300/160.  She had a recent  admission in October 2005 with blood pressure of 260/150 and was admitted  and started on Lisinopril, HCTZ and clonidine to be taken as an outpatient.  She reports that she did well on this regimen until she developed headache  off and on over the past week.  On admission she denied vision changes, neck  stiffness, photophobia, fever, but noted having 1-2 episodes of emesis and  chest pain.   PHYSICAL EXAMINATION:  Temperature 98.6, pulse 80, blood pressure 300/160,  respirations 18, O2 saturation 96% on room air.  She was in mild distress,  normocephalic and atraumatic.  Pupils equal, round and react to light and  accommodation.  Extraocular movements intact.  No evidence of papiledema.  Oropharynx was clear without erythema or exudate.  NECK:  Supple, no JVD, no carotid bruits, or thyromegaly.  RESPIRATORY:  Clear to auscultation bilaterally.  CARDIOVASCULAR:  A999333 systolic ejection murmur, no rubs or gallops.  GI:  Soft, nontender, nondistended, positive bowel sounds, no  hepatosplenomegaly, no renal bruit.  EXTREMITIES:  No edema.  SKIN:  No lesions or  petechiae.  NEUROLOGIC:  Alert and oriented x3.  Cranial nerves II-XII grossly intact.  Normal sensation and motor function throughout, no focal neurologic  deficits.   LABS ON ADMISSION:  Sodium 140, potassium 3.4, chloride 109, bicarb 25, BUN  13, creatinine 1.4, glucose 89, WBC 7.1, hemoglobin 15, platelets 197, PT  12.9, PTT 32.  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 Chest x-ray -- cardiomegaly, no acute disease.  Point of care markers myoglobin 81, CK-MB 1.7, troponin I less than 0.05.  EKG normal sinus rhythm with rate of 66 and evidence of left ventricular  hypertrophy.   HOSPITAL COURSE:  1.  Hypertensive urgency.  [name] stated that she did not take her blood      pressure medication on the morning of admission.  She was admitted,      enzymes were cycled, CT scan was obtained, there was no evidence of      cardiac ischemia, she was not encephalopathic.  CT scan  did not reveal      any acute abnormalities.  Her blood pressure was initially treated with      nitroprusside drip and she was restarted on her home medications the      morning following admission.  A urine drug screen was checked looking      for possible cocaine use.  On day two the nitroprusside drip was      stopped, blood pressure was well-controlled on this drip.  She was      treated with p.r.n. labetalol IV on hospital day two while transitioning      to her p.o. regimen.  By hospital day two on Captopril and labetalol her      blood pressure was controlled and she was discharged on labetalol 100 mg      p.o. b.i.d., Lisinopril 10 mg p.o. daily and HCTZ 25 mg daily.  She was      to stop taking clonidine.  2.  Chronic renal insufficiency.  Baseline creatinine 1.5, on admission her      creatinine was 1.4 and trended up on hospital day #2 to 1.8, however      this decreased back to 1.5 on the day of discharge.  She was urged to      continue taking  medications to achieve optimal control of her blood      pressure as an outpatient to reduce risk of further worsening of her      chronic renal insufficiency.  3.  Tobacco abuse.  [name] was advised to stop smoking.  This is an issue      that should be followed up on outpatient visits.   DISPOSITION:  [name] is a patient of [place], and  after her brief hospital followup in the Outpatient Clinic at [facility]  with Dr. Rolena Infante on October 09, 2004, she will resume her care there.   DISCHARGE LABS:  Sodium 137, potassium 4.2, chloride 110, bicarb 23, BUN 15,  creatinine 1.5, glucose 195.      BM/MEDQ  D:  12/09/2004  T:  12/09/2004  Job:  [id number]

## 2011-03-28 ENCOUNTER — Emergency Department (HOSPITAL_COMMUNITY)
Admission: EM | Admit: 2011-03-28 | Discharge: 2011-03-29 | Disposition: A | Discharge status: Home / Self Care | Payer: No Typology Code available for payment source | Attending: Emergency Medicine | Admitting: Emergency Medicine

## 2011-03-28 DIAGNOSIS — M545 Low back pain, unspecified: Secondary | ICD-10-CM | POA: Insufficient documentation

## 2011-03-28 DIAGNOSIS — M549 Dorsalgia, unspecified: Secondary | ICD-10-CM | POA: Insufficient documentation

## 2011-03-28 DIAGNOSIS — I10 Essential (primary) hypertension: Secondary | ICD-10-CM | POA: Insufficient documentation

## 2011-04-17 ENCOUNTER — Encounter: Payer: Self-pay | Admitting: Cardiology

## 2011-04-18 ENCOUNTER — Encounter: Payer: Self-pay | Admitting: Cardiology

## 2011-04-18 ENCOUNTER — Ambulatory Visit (INDEPENDENT_AMBULATORY_CARE_PROVIDER_SITE_OTHER): Payer: Medicaid Other | Admitting: Cardiology

## 2011-04-18 DIAGNOSIS — E785 Hyperlipidemia, unspecified: Secondary | ICD-10-CM

## 2011-04-18 DIAGNOSIS — N189 Chronic kidney disease, unspecified: Secondary | ICD-10-CM

## 2011-04-18 DIAGNOSIS — I10 Essential (primary) hypertension: Secondary | ICD-10-CM

## 2011-04-18 DIAGNOSIS — I2510 Atherosclerotic heart disease of native coronary artery without angina pectoris: Secondary | ICD-10-CM

## 2011-04-18 DIAGNOSIS — F17200 Nicotine dependence, unspecified, uncomplicated: Secondary | ICD-10-CM

## 2011-04-18 DIAGNOSIS — I5032 Chronic diastolic (congestive) heart failure: Secondary | ICD-10-CM

## 2011-04-18 LAB — LIPID PANEL
HDL: 37.1 mg/dL — ABNORMAL LOW (ref >39.00)
LDL Cholesterol: 107 mg/dL — ABNORMAL HIGH (ref 0–99)
Total CHOL/HDL Ratio: 5
VLDL: 23 mg/dL (ref 0.0–40.0)

## 2011-04-18 LAB — BASIC METABOLIC PANEL
BUN: 26 mg/dL — ABNORMAL HIGH (ref 6–23)
CO2: 23 mEq/L (ref 19–32)
Chloride: 110 mEq/L (ref 96–112)
Glucose, Bld: 89 mg/dL (ref 70–99)
Potassium: 4.1 mEq/L (ref 3.5–5.1)
Sodium: 139 mEq/L (ref 135–145)

## 2011-04-18 LAB — HEPATIC FUNCTION PANEL
Bilirubin, Direct: 0.1 mg/dL (ref 0.0–0.3)
Total Bilirubin: 0.5 mg/dL (ref 0.3–1.2)

## 2011-04-18 MED ORDER — CARVEDILOL 12.5 MG PO TABS: 12.5000 mg | ORAL_TABLET | Freq: Two times a day (BID) | ORAL | Status: DC

## 2011-04-18 MED ORDER — HYDRALAZINE HCL 50 MG PO TABS: 50.0000 mg | ORAL_TABLET | Freq: Three times a day (TID) | ORAL | Status: DC

## 2011-04-18 MED ORDER — FUROSEMIDE 40 MG PO TABS: 40.0000 mg | ORAL_TABLET | Freq: Every day | ORAL | Status: DC

## 2011-04-18 MED ORDER — ATORVASTATIN CALCIUM 80 MG PO TABS: 80.0000 mg | ORAL_TABLET | Freq: Every day | ORAL | Status: DC

## 2011-04-18 MED ORDER — CLONIDINE HCL 0.1 MG PO TABS
0.2000 mg | ORAL_TABLET | ORAL | Status: DC
  Administered 2011-04-18: 0.2 mg via ORAL

## 2011-04-18 MED ORDER — NICOTINE 21 MG/24HR TD PT24: 1.0000 | MEDICATED_PATCH | TRANSDERMAL | Status: AC

## 2011-04-18 MED ORDER — AMLODIPINE BESYLATE 10 MG PO TABS: 10.0000 mg | ORAL_TABLET | Freq: Every day | ORAL | Status: DC

## 2011-04-18 MED ORDER — NICOTINE 21 MG/24HR TD PT24: 1.0000 | MEDICATED_PATCH | TRANSDERMAL | Status: DC

## 2011-04-18 NOTE — Patient Instructions (Signed)
Start lasix (furosemide) 40mg  daily.  Start Hydralazine 50mg  three times a day.  Start Amlodipine 10mg  daily.  Use nicotine patch 21mg  daily to help you stop smoking. You can use this for a total of 3 months.  Lab today--BMP/Lipid/Liver 401.9  414.01  Schedule an appointment to see Margaret Pyle in 2 weeks. Schedule an appointment for lab in 2 weeks --BMP 401.9  414.01

## 2011-04-20 NOTE — Assessment & Plan Note (Signed)
Lipids/LFTs today.  She is on atorvastatin.  Goal LDL < 70.

## 2011-04-20 NOTE — Assessment & Plan Note (Signed)
She had been on Lasix 40 mg bid.  She has been off Lasix for several weeks and does not appear particularly volume overloaded.  I will have her start back on Lasix 40 mg once a day rather than twice a day. BMET in 2 wks.

## 2011-04-20 NOTE — Progress Notes (Signed)
50 yo with history of resistant HTN, diastolic CHF, and CAD presents for followup after a 2 year hiatus.  She apparently has been dismissed from her primary care's practice and has been out of most of her medications for several weeks.  Blood pressure today is 218/120.  She has a mild headache.  She denies any recent chest pain. She actually has minimal exertional symptoms: a little winded when climbing steps.  No syncope/lightheadedness.  Main complaint is actually low back pain with sciatica.  She continues to smoke about 10 cigarettes/day.   We gave her 0.2 mg clonidine po in the office today and BP came down to about 170/90.   Labs (3/10): LDL 99, HDL 29, creatinine 1.4 Labs (9/11): K 3.6, creatinine 1.75  ECG: NSR, LVH, LAE  Allergies (verified):  1)  ! Pcn 2)  ! Ampicillin  Past Medical History: 1. Hypertension (resistant). 2. Hypercholesterolemia. 3. Tobacco abuse.   4. Coronary artery disease.  The patient had an ST elevation MI in February 2009.  Left heart catheterization at that time showed a 70% distal LAD lesion that was hazy consistent with a plaqurupture.  She had a 50% distal circumflex stenosis and a 95% distal RCA stenosis.  She did have a drug-eluting stent placed in her distal RCA. 5. Chronic kidney disease 6. Remote intracranial hemorrhage. 7. Diastolic congestive heart failure.  Last echo (2/09) with EF 60%, severe LV hypertrophy, normal RV.  8. Low back pain/sciatica  Family History: Premature CAD in 1st degree relatives.   Social History: Smokes 1/2 ppd.  No current drug or ETOH abuse.  Lives by herself but helps care for her 8 grandchildren.   Review of Systems        All systems reviewed and negative except as per HPI.   Current Outpatient Prescriptions  Medication Sig Dispense Refill  . aspirin (ASPIR-81) 81 MG EC tablet Take 81 mg by mouth daily.        Marland Kitchen atorvastatin (LIPITOR) 80 MG tablet Take 1 tablet (80 mg total) by mouth daily.  30 tablet  6  .  carvedilol (COREG) 12.5 MG tablet Take 1 tablet (12.5 mg total) by mouth 2 (two) times daily.  60 tablet  6  . nitroGLYCERIN (NITROSTAT) 0.4 MG SL tablet Place 0.4 mg under the tongue every 5 (five) minutes as needed.        Marland Kitchen oxyCODONE-acetaminophen (PERCOCET) 5-325 MG per tablet Take 1 tablet by mouth every 4 (four) hours as needed.        Marland Kitchen amLODipine (NORVASC) 10 MG tablet Take 1 tablet (10 mg total) by mouth daily.  30 tablet  6  . furosemide (LASIX) 40 MG tablet Take 1 tablet (40 mg total) by mouth daily.  30 tablet  6  . hydrALAZINE (APRESOLINE) 50 MG tablet Take 1 tablet (50 mg total) by mouth 3 (three) times daily.  90 tablet  6  . nicotine (NICODERM CQ) 21 mg/24hr patch Place 1 patch (21 patches total) onto the skin daily.  28 patch  2   Current Facility-Administered Medications  Medication Dose Route Frequency Provider Last Rate Last Dose  . cloNIDine (CATAPRES) tablet 0.2 mg  0.2 mg Oral 1 dose over 24 hours Loralie Champagne, MD   0.2 mg at 04/18/11 1145    BP 170/92  Pulse 66  Ht 5\' 1"  (1.549 m)  Wt 154 lb (69.854 kg)  BMI 29.10 kg/m2 General: NAD Neck: No JVD, no thyromegaly or thyroid nodule.  Lungs:  Clear to auscultation bilaterally with normal respiratory effort. CV: Nondisplaced PMI.  Heart regular S1/S2, +S4, no murmur.  No peripheral edema.  No carotid bruit.  Normal pedal pulses.  Abdomen: Soft, nontender, no hepatosplenomegaly, no distention.  Neurologic: Alert and oriented x 3.  Psych: Normal affect. Extremities: No clubbing or cyanosis.

## 2011-04-20 NOTE — Assessment & Plan Note (Signed)
Will check BMET to follow creatinine.

## 2011-04-20 NOTE — Assessment & Plan Note (Signed)
I encouraged the patient to quit smoking.  She will try a nicotine patch as this has worked in the past.

## 2011-04-20 NOTE — Assessment & Plan Note (Addendum)
Hypertensive urgency, better with 1 dose po clonidine.  Only symptom was a mild headache.  She has been off most of her antihypertensive meds for several weeks.  She is taking Coreg, which she will continue.  I will have her start back on a calcium channel blocker (will use amlodipine 10 mg daily rather than nifedipine XR) and on hydralazine 50 mg tid.  Will hold off on ACEI given CKD.   Followup in 2 wks to titrate BP meds.

## 2011-04-20 NOTE — Assessment & Plan Note (Signed)
No ischemic symptoms.  I will have her continue ASA, statin, and Coreg.  I do not think that she has to restart Plavix.

## 2011-04-29 ENCOUNTER — Telehealth: Payer: Self-pay | Admitting: *Deleted

## 2011-04-29 DIAGNOSIS — E785 Hyperlipidemia, unspecified: Secondary | ICD-10-CM

## 2011-04-29 DIAGNOSIS — I2510 Atherosclerotic heart disease of native coronary artery without angina pectoris: Secondary | ICD-10-CM

## 2011-04-29 MED ORDER — ATORVASTATIN CALCIUM 80 MG PO TABS: 80.0000 mg | ORAL_TABLET | Freq: Every day | ORAL | Status: DC

## 2011-04-29 NOTE — Telephone Encounter (Signed)
otes Recorded by Katrine Coho, RN on 04/29/2011 at 10:00 AM I talked with pt. She will return for [facility] 05/12/11. She will increase Atorvastatin to 80mg  daily. Notes Recorded by Katrine Coho, RN on 04/29/2011 at 8:50 AM I talked with someone at [phone] and this is not pt's current phone number. I called a friend contact number [phone]. The friend was going to ask pt to call me back. Notes Recorded by Katrine Coho, RN on 04/28/2011 at 12:16 PM LMTCB Notes Recorded by Loralie Champagne, MD on 04/20/2011 at 5:02 PM Creatinine is 2, which is up from prior. Would repeat BMET in 2 wks (restarted Lasix). She also should increase her atorvastatin to 80 mg daily with goal of LDL < 70.

## 2011-04-29 NOTE — Telephone Encounter (Signed)
Message copied by Katrine Coho on Tue Apr 29, 2011 10:02 AM ------      Message from: Larey Dresser      Created: Sun Apr 20, 2011  5:02 PM       Creatinine is 2, which is up from prior.  Would repeat BMET in 2 wks (restarted Lasix).  She also should increase her atorvastatin to 80 mg daily with goal of LDL < 70.

## 2011-05-12 ENCOUNTER — Other Ambulatory Visit (INDEPENDENT_AMBULATORY_CARE_PROVIDER_SITE_OTHER): Payer: No Typology Code available for payment source | Admitting: *Deleted

## 2011-05-12 ENCOUNTER — Telehealth: Payer: Self-pay | Admitting: *Deleted

## 2011-05-12 DIAGNOSIS — E785 Hyperlipidemia, unspecified: Secondary | ICD-10-CM

## 2011-05-12 DIAGNOSIS — E876 Hypokalemia: Secondary | ICD-10-CM

## 2011-05-12 DIAGNOSIS — I2510 Atherosclerotic heart disease of native coronary artery without angina pectoris: Secondary | ICD-10-CM

## 2011-05-12 LAB — BASIC METABOLIC PANEL
CO2: 26 mEq/L (ref 19–32)
Glucose, Bld: 74 mg/dL (ref 70–99)
Potassium: 3 mEq/L — ABNORMAL LOW (ref 3.5–5.1)
Sodium: 136 mEq/L (ref 135–145)

## 2011-05-12 NOTE — Telephone Encounter (Signed)
CALLED PT RE LOW POTASSIUM  OF 3.0 PER SCOTT WEAVER PAC DECREASE LASIX TO 20 MG EVERY DAY AND EAT BANANA EVERY DAY FOR THREE DAYS  REPEAT BMET  IN 1 WEEK  PT AWARE./CY./CY

## 2011-05-13 ENCOUNTER — Other Ambulatory Visit: Payer: Self-pay | Admitting: *Deleted

## 2011-05-19 ENCOUNTER — Other Ambulatory Visit: Payer: Medicaid Other | Admitting: *Deleted

## 2011-05-20 ENCOUNTER — Ambulatory Visit (INDEPENDENT_AMBULATORY_CARE_PROVIDER_SITE_OTHER): Payer: No Typology Code available for payment source | Admitting: *Deleted

## 2011-05-20 DIAGNOSIS — I5032 Chronic diastolic (congestive) heart failure: Secondary | ICD-10-CM

## 2011-05-20 DIAGNOSIS — E876 Hypokalemia: Secondary | ICD-10-CM

## 2011-05-20 LAB — BASIC METABOLIC PANEL
Chloride: 106 mEq/L (ref 96–112)
GFR: 33.84 mL/min — ABNORMAL LOW (ref >60.00)
Potassium: 3.7 mEq/L (ref 3.5–5.1)
Sodium: 136 mEq/L (ref 135–145)

## 2011-05-27 ENCOUNTER — Other Ambulatory Visit (INDEPENDENT_AMBULATORY_CARE_PROVIDER_SITE_OTHER): Payer: No Typology Code available for payment source | Admitting: *Deleted

## 2011-05-27 DIAGNOSIS — I509 Heart failure, unspecified: Secondary | ICD-10-CM

## 2011-05-27 DIAGNOSIS — I5032 Chronic diastolic (congestive) heart failure: Secondary | ICD-10-CM

## 2011-05-27 LAB — BASIC METABOLIC PANEL
CO2: 24 mEq/L (ref 19–32)
Calcium: 8.6 mg/dL (ref 8.4–10.5)
Chloride: 108 mEq/L (ref 96–112)
Potassium: 3.9 mEq/L (ref 3.5–5.1)
Sodium: 140 mEq/L (ref 135–145)

## 2011-05-28 ENCOUNTER — Other Ambulatory Visit: Payer: Self-pay | Admitting: *Deleted

## 2011-05-28 ENCOUNTER — Telehealth: Payer: Self-pay | Admitting: Cardiology

## 2011-05-28 MED ORDER — NITROGLYCERIN 0.4 MG SL SUBL: 0.4000 mg | SUBLINGUAL_TABLET | SUBLINGUAL | Status: DC | PRN

## 2011-05-28 NOTE — Telephone Encounter (Signed)
[facility] faxed to [place] @  [phone]  05/28/11/km

## 2011-06-05 ENCOUNTER — Telehealth: Payer: Self-pay | Admitting: Cardiology

## 2011-06-05 NOTE — Telephone Encounter (Signed)
Tamika RN has question re pt meds. Tamika would like to talk to a nurse.

## 2011-06-05 NOTE — Telephone Encounter (Signed)
[name] from a primary care office calling about [name]'s med list--LMTCB--nt

## 2011-06-05 NOTE — Telephone Encounter (Signed)
tamika calling from [facility] asking for med list for ms Kretsch--advised i would fax to them--tamica agrees--nt

## 2011-06-11 ENCOUNTER — Telehealth: Payer: Self-pay | Admitting: *Deleted

## 2011-06-11 NOTE — Telephone Encounter (Signed)
Returning call back to nurse.  

## 2011-06-11 NOTE — Telephone Encounter (Signed)
lmom for ptcb to verify if pt is on lisinopril-hctz 20-12.5 mg qd. [name]

## 2011-06-13 ENCOUNTER — Ambulatory Visit (INDEPENDENT_AMBULATORY_CARE_PROVIDER_SITE_OTHER): Payer: Medicaid Other | Admitting: Physician Assistant

## 2011-06-13 ENCOUNTER — Encounter: Payer: Self-pay | Admitting: Physician Assistant

## 2011-06-13 VITALS — BP 180/101 | HR 68 | Ht 61.0 in | Wt 154.0 lb

## 2011-06-13 DIAGNOSIS — I5032 Chronic diastolic (congestive) heart failure: Secondary | ICD-10-CM

## 2011-06-13 DIAGNOSIS — I2510 Atherosclerotic heart disease of native coronary artery without angina pectoris: Secondary | ICD-10-CM

## 2011-06-13 DIAGNOSIS — I10 Essential (primary) hypertension: Secondary | ICD-10-CM

## 2011-06-13 DIAGNOSIS — F17200 Nicotine dependence, unspecified, uncomplicated: Secondary | ICD-10-CM

## 2011-06-13 DIAGNOSIS — E785 Hyperlipidemia, unspecified: Secondary | ICD-10-CM

## 2011-06-13 DIAGNOSIS — N189 Chronic kidney disease, unspecified: Secondary | ICD-10-CM

## 2011-06-13 DIAGNOSIS — I509 Heart failure, unspecified: Secondary | ICD-10-CM

## 2011-06-13 LAB — BASIC METABOLIC PANEL
BUN: 21 mg/dL (ref 6–23)
CO2: 25 mEq/L (ref 19–32)
Chloride: 109 mEq/L (ref 96–112)
Creatinine, Ser: 1.8 mg/dL — ABNORMAL HIGH (ref 0.4–1.2)
Glucose, Bld: 86 mg/dL (ref 70–99)

## 2011-06-13 MED ORDER — ESOMEPRAZOLE MAGNESIUM 40 MG PO CPDR: 40.0000 mg | DELAYED_RELEASE_CAPSULE | Freq: Every day | ORAL | Status: DC | PRN

## 2011-06-13 MED ORDER — ATORVASTATIN CALCIUM 80 MG PO TABS: 80.0000 mg | ORAL_TABLET | Freq: Every day | ORAL | Status: DC

## 2011-06-13 MED ORDER — ESOMEPRAZOLE MAGNESIUM 40 MG PO CPDR: 40.0000 mg | DELAYED_RELEASE_CAPSULE | Freq: Every day | ORAL | Status: DC

## 2011-06-13 MED ORDER — AMLODIPINE BESYLATE 10 MG PO TABS: 10.0000 mg | ORAL_TABLET | Freq: Every day | ORAL | Status: DC

## 2011-06-13 MED ORDER — HYDRALAZINE HCL 50 MG PO TABS: 50.0000 mg | ORAL_TABLET | Freq: Three times a day (TID) | ORAL | Status: DC

## 2011-06-13 MED ORDER — CARVEDILOL 6.25 MG PO TABS: 6.2500 mg | ORAL_TABLET | Freq: Two times a day (BID) | ORAL | Status: DC

## 2011-06-13 NOTE — Patient Instructions (Addendum)
Your physician recommends that you schedule a follow-up appointment in: 06/20/11 11:00 AM WITH SCOTT WEAVER PA-C   CONTINUE TAKING THE HYDRALAZINE 50 MG 1 TABLET 3 TIMES DAILY THIS IS FOR BLOOD PRESSURE.  CONTINUE TAKING AMLODIPINE 10 MG 1 TABLET DAILY THIS IS FOR BLOOD PRESSURE  CONTINUE ATORVASTATIN (LIPITOR) 80 MG 1 TABLET EVERY NIGHT BEFORE BED THIS IS FOR YOUR CHOLESTEROL  CONTINUE NEXIUM 40 MG 1 CAPSULE DAILY AS NEEDED THIS IS FOR YOUR STOMACH  RESTART COREG 6.25 MG TAKE 1 TABLET TWICE DAILY THIS IS FOR BLOOD PRESSURE  START ASPIRIN 81 MG TAKE 1 TABLET DAILY THIS IS FOR YOUR HEART  STOP TAKING METOPROLOL   DO NOT TAKE LASIX UNTIL FURTHER NOTICE FROM YOUR CARDIOLOGIST. WE WANT YOU TO WEIGH YOURSELF DAILY AND IF YOUR WEIGHT IS INCREASED 3 POUNDS OR MORE IN 1 DAY AND IF YOU ARE MORE SHORT OF BREATH OR HAVE MORE SWELLING CAL OUR OFFICE [phone] AND ASK FOR SCOTT WEAVER, PA-C.  DECREASE YOUR SALT INTAKE.  Your physician recommends that you return for lab work in: TODAY BMET 414.01

## 2011-06-13 NOTE — Assessment & Plan Note (Signed)
Volume appears stable.  She is not on Lasix now.  She has been provided with instructions to call if she is gaining weight or edema.

## 2011-06-13 NOTE — Assessment & Plan Note (Signed)
Stable.  She had recent CP.  This is atypical.  Her ECG is unchanged.  We will eventually have her do a stress test as it is over 3 years since her PCI.  However, I want to work on getting her BP down over the next couple of weeks.  She has been asked to restart ASA 81 mg QD.

## 2011-06-13 NOTE — Assessment & Plan Note (Signed)
Continue atorvastatin 80 mg QD.  We discussed that this is for cholesterol and that it does not affect her BP.

## 2011-06-13 NOTE — Assessment & Plan Note (Signed)
Uncontrolled.  She is noncompliant with medications.  She reports significant confusion regarding recent medication changes.  I spent over 30 minutes discussing her medications with her today.  I gave her several tips to help keep track of her medications such as putting a number on the medication on her discharge sheet and also number her bottles, or use some type of identification system.  She reports that she is literate.  We also changed her prescriptions to include messages such as take "hydralazine 50 mg 3 times a day for blood pressure."  We also discussed the need to decrease her salt intake.  I offered a referral to a nutritionist.  She declined this.  I explained to her the importance of decreasing her salt to help with her blood pressure.  We also discussed discontinuing tobacco use.    She will continue hydralazine 50 mg 3 times a day.  I will have her restart amlodipine 10 mg a day.  I will have her restart carvedilol 6.25 mg twice a day.  She will be brought back in close followup with me next week.  Check a basic metabolic panel today.

## 2011-06-13 NOTE — Progress Notes (Signed)
History of Present Illness: Primary Cardiologist:  Dr. Loralie Champagne   [name] is a 50 y.o. female with a history of resistant HTN, diastolic CHF, and CAD.  She has reportedly been dismissed from her primary care's practice.  She has just established with a new provider at [facility] (does not know the name).  When last seen by Dr. Aundra Dubin, she was out of most of her medications.  Blood pressure last time was 218/120 initially before being given clonidine in the office.  She was restarted on her antihypertensive medications.  Coreg was continued.  She was started back on amlodipine 10 QD and hydralazine 50 mg t.i.d.  Ace inhibitor was not used secondary to CKD.  Lasix 40 mg daily was also restarted.  Labs indicated decreased potassium at 3 and increased creatinine at 2.3.  Lasix was decreased.  Followup labs indicated a creatinine of 2 and Lasix was discontinued.  Her last basic metabolic panel 8/7: Potassium 3.9, creatinine 2.1.  This was stable.  Her atorvastatin was also increased based upon recent lipid panel.  She was last seen 6/29 and was to follow up in 2 weeks.  This is her first visit back.    She reports significant confusion regarding her medications.  When she was called about the atorvastatin and the Lasix, she became confused and is basically only taking hydralazine now.  I asked her several times and she admits to taking hydralazine 3 times a day every day since she last saw Dr. Aundra Dubin.  She admits to dietary noncompliance.  She eats at fast food restaurants most days of the week.  She also eats canned foods.  She is still smoking 3-4 cigarettes per day.  She occasionally uses her nicotine patch.  She denies headache.  She denies difficulty with speech or unilateral weakness.  She had an episode of chest pain a few days ago.  This was a left-sided "prickling sensation."  There was no associated radiation, shortness of breath, nausea or diaphoresis.  This was nonexertional.  She is fairly  active around her home.  She rakes the yard, she vacuums rooms and sweeps without chest pain or shortness of breath.  She denies syncope.  She denies orthopnea, PND or significant pedal edema.  She took nitroglycerin x1 and her symptoms resolved.  Of note, she is under a significant amount of stress.  Her 59 year old daughter just moved back home.  She also has a 81 year old son.  She notes increased stress around the time she developed chest pain.  Labs (3/10): LDL 99, HDL 29, creatinine 1.4 Labs (9/11): K 3.6, creatinine 1.75 Labs (05/12/11): K 3.0, creat 2.3 Labs (05/20/11): K 3.7, creat 2.0 Labs (05/27/11):   K 3.9, creat 2.1  Past Medical History: 1. Hypertension (resistant). 2. Hypercholesterolemia. 3. Tobacco abuse.   4. Coronary artery disease.  The patient had NSTEMI in February 2009.  Left heart catheterization at that time showed a 70% distal LAD lesion that was hazy consistent with a plaque rupture, mCFX 30%, dCFX 50%, dRCA 95%, PLB 40%.  She did have a Promus drug-eluting stent placed in her distal RCA. 5. Chronic kidney disease (creat 2-2.3) 6. Remote intracranial hemorrhage.  H/o cerebral aneurysm clipping 7. Diastolic congestive heart failure.  Last echo (2/09) with EF 60%, severe LV hypertrophy, normal RV.  8. Low back pain/sciatica 9.  GERD   Current Outpatient Prescriptions  Medication Sig Dispense Refill  . amLODipine (NORVASC) 10 MG tablet Take 1 tablet (10 mg total)  by mouth daily.  30 tablet  6  . atorvastatin (LIPITOR) 80 MG tablet Take 1 tablet (80 mg total) by mouth daily.  30 tablet  3  . esomeprazole (NEXIUM) 40 MG capsule Take 40 mg by mouth daily before breakfast.        . furosemide (LASIX) 40 MG tablet Take 40 mg by mouth daily.        . hydrALAZINE (APRESOLINE) 50 MG tablet Take 1 tablet (50 mg total) by mouth 3 (three) times daily.  90 tablet  6  . metoprolol tartrate (LOPRESSOR) 25 MG tablet Take 25 mg by mouth 2 (two) times daily.        . nicotine  (NICODERM CQ - DOSED IN MG/24 HOURS) 21 mg/24hr patch Place 1 patch onto the skin daily.        . nitroGLYCERIN (NITROSTAT) 0.4 MG SL tablet Place 1 tablet (0.4 mg total) under the tongue every 5 (five) minutes as needed.  25 tablet  12   Current Facility-Administered Medications  Medication Dose Route Frequency Provider Last Rate Last Dose  . cloNIDine (CATAPRES) tablet 0.2 mg  0.2 mg Oral 1 day or 1 dose Loralie Champagne, MD   0.2 mg at 04/18/11 1145    Allergies: Allergies  Allergen Reactions  . Ampicillin   . Penicillins     Family History: Premature CAD in 1st degree relatives.   Social History: Smokes 3-4 cigarettes per day.  No current drug or ETOH abuse.    ROS:   Please see the history of present illness.  All other systems reviewed and negative.   Vital Signs: BP 180/101  Pulse 68  Ht 5\' 1"  (1.549 m)  Wt 154 lb (69.854 kg)  BMI 29.10 kg/m2 Repeat blood pressure by me: Left 180/90, right 180/100  PHYSICAL EXAM: Well nourished, well developed, in no acute distress HEENT: normal Neck: no JVD Endocrine: No thyromegaly Vascular: No carotid bruits Cardiac:  normal S1, S2; RRR; no murmur Lungs:  clear to auscultation bilaterally, no wheezing, rhonchi or rales Abd: soft, nontender, no hepatomegaly Ext: no edema Skin: warm and dry Neuro:  CNs 2-12 intact, no focal abnormalities noted Psych: Normal affect  EKG:  Sinus rhythm, heart rate 66, left axis deviation, LVH, T wave inversions in one, aVL, V6, no significant change when compared to prior tracing dated 06/21/09  ASSESSMENT AND PLAN:

## 2011-06-13 NOTE — Assessment & Plan Note (Signed)
Check BMET today 

## 2011-06-13 NOTE — Assessment & Plan Note (Signed)
We discussed a plan of action to quit smoking.  I asked her to set a quit date and not use nicotine patches PRN.

## 2011-06-15 NOTE — Progress Notes (Signed)
Agree with stress test and close followup for BP management.   Min [place] [employer]

## 2011-06-20 ENCOUNTER — Ambulatory Visit (INDEPENDENT_AMBULATORY_CARE_PROVIDER_SITE_OTHER): Payer: Medicaid Other | Admitting: Physician Assistant

## 2011-06-20 ENCOUNTER — Encounter: Payer: Self-pay | Admitting: Physician Assistant

## 2011-06-20 VITALS — BP 240/92 | HR 68 | Ht 61.0 in | Wt 152.0 lb

## 2011-06-20 DIAGNOSIS — I10 Essential (primary) hypertension: Secondary | ICD-10-CM

## 2011-06-20 DIAGNOSIS — I5032 Chronic diastolic (congestive) heart failure: Secondary | ICD-10-CM

## 2011-06-20 DIAGNOSIS — F17200 Nicotine dependence, unspecified, uncomplicated: Secondary | ICD-10-CM

## 2011-06-20 DIAGNOSIS — I2510 Atherosclerotic heart disease of native coronary artery without angina pectoris: Secondary | ICD-10-CM

## 2011-06-20 DIAGNOSIS — I509 Heart failure, unspecified: Secondary | ICD-10-CM

## 2011-06-20 DIAGNOSIS — N189 Chronic kidney disease, unspecified: Secondary | ICD-10-CM

## 2011-06-20 NOTE — Assessment & Plan Note (Signed)
I will go ahead and arrange a Lexiscan Myoview.

## 2011-06-20 NOTE — Assessment & Plan Note (Addendum)
She is still not taking her medications correctly.  I spent a long time discussing the sequelae of uncontrolled HTN.  We discussed the real possibility of having a stroke, ending up on dialysis or having a myocardial infarction.  She states she had to wait to get paid.  However, she is still purchasing/smoking cigarettes.  We discussed the importance of her medications over things like cigarettes.  She will pick up norvasc and coreg today.  Continue hydralazine.  I will give her clonidine 0.1 mg x 1 now.  We will keep her here to make sure her BP is improved prior to leaving.  I can see her in f/u in 2 weeks.  Of note, her BP came down to 170/90 with clonidine 0.1 mg x 2.

## 2011-06-20 NOTE — Assessment & Plan Note (Signed)
She knows she needs to quit.

## 2011-06-20 NOTE — Assessment & Plan Note (Signed)
Volume stable. 

## 2011-06-20 NOTE — Assessment & Plan Note (Signed)
Recent creatinine stable.  However, as noted above, I have discussed with her that she is at high risk of ending up on dialysis with uncontrolled BP.

## 2011-06-20 NOTE — Patient Instructions (Signed)
Your physician recommends that you schedule a follow-up appointment in: [place], PA-C  Your physician has requested that you have a lexiscan myoview DX [phone], 401.9. For further information please visit [email or web address]. Please follow instruction sheet, as given.  PICK UP MEDICATIONS FROM PHARMACY TODAY NORVASC, COREG

## 2011-06-20 NOTE — Progress Notes (Signed)
History of Present Illness: Primary Cardiologist:  Dr. Loralie Champagne   [name] is a 50 y.o. female with a history of resistant HTN, diastolic CHF, and CAD.  She has reportedly been dismissed from her primary care's practice.  She has just established with a new provider at [facility] (does not know the name).  When last seen by Dr. Aundra Dubin, she was out of most of her medications.  Blood pressure last time was 218/120 initially before being given clonidine in the office.  She was restarted on her antihypertensive medications.  Coreg was continued.  She was started back on amlodipine 10 QD and hydralazine 50 mg t.i.d.  Ace inhibitor was not used secondary to CKD.  Lasix 40 mg daily was also restarted.  She was eventually taken off of Lasix due to renal insufficiency.  I saw her 8/24 in followup.  Her blood pressure was significantly elevated.  She was not taking medications as directed.  I spent a long-time educating her about hypertension and how to take her medications.  I asked her to continue hydralazine 50 mg 3 times a day, restart amlodipine 10 mg a day and restart carvedilol 6.25 mg twice a day.  She also complained of some atypical chest pain recently.  My attempt was to get her blood pressure down first and then proceed with a stress test as she does have a history of known CAD, status post PCI.  She had been taken off of Lasix as noted due to renal insufficiency and her volume was stable at that time.  We also discussed quitting smoking.  Labs (3/10): LDL 99, HDL 29, creatinine 1.4 Labs (9/11): K 3.6, creatinine 1.75 Labs (05/12/11): K 3.0, creat 2.3 Labs (05/20/11): K 3.7, creat 2.0 Labs (05/27/11):   K 3.9, creat 2.1 Labs (06/13/11): K 4, creat 1.8  She presents today with significantly elevated BP.  She did not get amlodipine or carvedilol filled.  She states she is taking hydralazine TID.  She is still smoking.  She says she is watching her salt.  No ETOH.  No drugs.  She denies chest pain,  dyspnea, orthopnea, PND, syncope or edema.  No headaches.  No TIA symptoms.  Past Medical History: 1. Hypertension (resistant). 2. Hypercholesterolemia. 3. Tobacco abuse.   4. Coronary artery disease.  The patient had NSTEMI in February 2009.  Left heart catheterization at that time showed a 70% distal LAD lesion that was hazy consistent with a plaque rupture, mCFX 30%, dCFX 50%, dRCA 95%, PLB 40%.  She did have a Promus drug-eluting stent placed in her distal RCA. 5. Chronic kidney disease (creat 2-2.3) 6. Remote intracranial hemorrhage.  H/o cerebral aneurysm clipping 7. Diastolic congestive heart failure.  Last echo (2/09) with EF 60%, severe LV hypertrophy, normal RV.  8. Low back pain/sciatica 9.  GERD   Current Outpatient Prescriptions  Medication Sig Dispense Refill  . amLODipine (NORVASC) 10 MG tablet Take 1 tablet (10 mg total) by mouth daily. This is for blood pressure  30 tablet  6  . aspirin EC 81 MG tablet Take 1 tablet (81 mg total) by mouth daily. This is for your heart      . atorvastatin (LIPITOR) 80 MG tablet Take 1 tablet (80 mg total) by mouth daily. This is for cholesterol  30 tablet  3  . carvedilol (COREG) 6.25 MG tablet Take 1 tablet (6.25 mg total) by mouth 2 (two) times daily. This is for blood pressure  60 tablet  11  . esomeprazole (NEXIUM) 40 MG capsule Take 1 capsule (40 mg total) by mouth daily as needed. This is for your stomach  30 capsule  11  . hydrALAZINE (APRESOLINE) 50 MG tablet Take 1 tablet (50 mg total) by mouth 3 (three) times daily. This is for blood pressure  90 tablet  6  . nicotine (NICODERM CQ - DOSED IN MG/24 HOURS) 21 mg/24hr patch Place 1 patch onto the skin daily.        . nitroGLYCERIN (NITROSTAT) 0.4 MG SL tablet Place 1 tablet (0.4 mg total) under the tongue every 5 (five) minutes as needed.  25 tablet  12  . furosemide (LASIX) 40 MG tablet Take 1 tablet (40 mg total) by mouth. DO NOT TAKE LASIX       Current Facility-Administered  Medications  Medication Dose Route Frequency Provider Last Rate Last Dose  . cloNIDine (CATAPRES) tablet 0.2 mg  0.2 mg Oral 1 day or 1 dose Loralie Champagne, MD   0.2 mg at 04/18/11 1145    Allergies: Allergies  Allergen Reactions  . Ampicillin   . Penicillins     Vital Signs: BP 240/92  Ht 5\' 1"  (1.549 m)  Wt 152 lb (68.947 kg)  BMI 28.72 kg/m2 Repeat blood pressure by me: Left 204/110;  Right 210/110  PHYSICAL EXAM: Well nourished, well developed, in no acute distress HEENT: normal Neck: no JVD Cardiac:  normal S1, S2; RRR; no murmur Lungs:  clear to auscultation bilaterally, no wheezing, rhonchi or rales Abd: soft, nontender, no hepatomegaly Ext: no edema Skin: warm and dry Neuro:  CNs 2-12 intact, no focal abnormalities noted Psych: Normal affect   ASSESSMENT AND PLAN:

## 2011-06-24 NOTE — Progress Notes (Signed)
Agree with note.  Poor compliance with meds limiting our ability to help control her BP. [place] [employer]

## 2011-07-01 ENCOUNTER — Ambulatory Visit (HOSPITAL_COMMUNITY): Payer: No Typology Code available for payment source | Attending: Cardiology | Admitting: Radiology

## 2011-07-01 DIAGNOSIS — I2510 Atherosclerotic heart disease of native coronary artery without angina pectoris: Secondary | ICD-10-CM | POA: Insufficient documentation

## 2011-07-01 DIAGNOSIS — I491 Atrial premature depolarization: Secondary | ICD-10-CM

## 2011-07-01 DIAGNOSIS — I10 Essential (primary) hypertension: Secondary | ICD-10-CM

## 2011-07-01 DIAGNOSIS — R079 Chest pain, unspecified: Secondary | ICD-10-CM

## 2011-07-01 DIAGNOSIS — R9431 Abnormal electrocardiogram [ECG] [EKG]: Secondary | ICD-10-CM

## 2011-07-01 MED ORDER — TECHNETIUM TC 99M TETROFOSMIN IV KIT
30.7000 | PACK | Freq: Once | INTRAVENOUS | Status: AC | PRN
  Administered 2011-07-01: 30.7 via INTRAVENOUS

## 2011-07-01 MED ORDER — REGADENOSON 0.4 MG/5ML IV SOLN
0.4000 mg | Freq: Once | INTRAVENOUS | Status: AC
  Administered 2011-07-01: 0.4 mg via INTRAVENOUS

## 2011-07-01 MED ORDER — TECHNETIUM TC 99M TETROFOSMIN IV KIT
10.6000 | PACK | Freq: Once | INTRAVENOUS | Status: AC | PRN
  Administered 2011-07-01: 11 via INTRAVENOUS

## 2011-07-01 NOTE — Progress Notes (Signed)
[place] [address] [phone]  Cardiology Nuclear Med Study  [name] is a 50 y.o. female [id number] June 20, 1961   Nuclear Med Background Indication for Stress Test:  Evaluation for Ischemia and Stent Patency History:  '97 MPS: Okay per patient done at [facility];'09 NSTEMI>Cath: 95% RCA> Stent of RCA>Residual 70% LAD;2/09 Echo: EF=60-65%; history of CHF Cardiac Risk Factors: Family History - CAD, History of Smoking, Hypertension, Lipids, Smoker and TIA  Symptoms:  Chest Pain, Chest Pain and Chest Tightness with Exertion (last date of chest discomfort yesterday), Dizziness, DOE, Fatigue, Fatigue with Exertion and Nausea   Nuclear Pre-Procedure Caffeine/Decaff Intake:  11:00pm yesterday, 16 oz mountain dew NPO After: 11:00pm yesterday  Lungs:  Clear IV 0.9% NS with Angio Cath:  20g  IV Site: R Antecubital  IV Started by:  Irven Baltimore, RN  Chest Size (in):  32 Cup Size: B  Height: 5\' 1"  (1.549 m)  Weight:  153 lb (69.4 kg)  BMI:  Body mass index is 28.91 kg/(m^2). Tech Comments:  The patient to have rest myoview now, and will come back at 12:30 for lexiscan myoview due to caffeine 9 hrs ago.    Nuclear Med Study 1 or 2 day study: 1 day  Stress Test Type:  Carlton Adam  Reading MD: Kirk Ruths, MD  Order Authorizing Provider:  Loralie Champagne, MD  Resting Radionuclide: Technetium 21m Tetrofosmin  Resting Radionuclide Dose: 10.6 mCi   Stress Radionuclide:  Technetium 87m Tetrofosmin  Stress Radionuclide Dose: 30.7 mCi           Stress Protocol Rest HR: 65 Stress HR: 93  Rest BP: 141/89 Stress BP: 167/80  Exercise Time (min): n/a METS: n/a   Predicted Max HR: 171 bpm % Max HR: 54.39 bpm Rate Pressure Product: 15531   Dose of Adenosine (mg):  n/a Dose of Lexiscan: 0.4 mg  Dose of Atropine (mg): n/a Dose of Dobutamine: n/a mcg/kg/min (at max HR)  Stress Test Technologist: Irven Baltimore, RN  Nuclear  Technologist:  Charlton Amor, CNMT     Rest Procedure:  Myocardial perfusion imaging was performed at rest 45 minutes following the intravenous administration of Technetium 67m Tetrofosmin. Rest ECG: NSR with marked T wave inversion, poor R wave progression  Stress Procedure:  The patient received IV Lexiscan 0.4 mg over 15-seconds.  Technetium 38m Tetrofosmin injected at 30-seconds.  The EKG was nondiagnostic due to baseline T wave changes. There were occasional PAC's.  Quantitative spect images were obtained after a 45 minute delay. Stress ECG: No significant change from baseline ECG  QPS Raw Data Images:  Acquisition technically good; normal left ventricular size. Stress Images:  Normal homogeneous uptake in all areas of the myocardium. Rest Images:  Normal homogeneous uptake in all areas of the myocardium. Subtraction (SDS):  No evidence of ischemia. Transient Ischemic Dilatation (Normal <1.22):  1.0 Lung/Heart Ratio (Normal <0.45):  .34  Quantitative Gated Spect Images QGS EDV:  90 ml QGS ESV:  37 ml QGS cine images:  NL LV Function; NL Wall Motion QGS EF: 58%  Impression Exercise Capacity:  Lexiscan with no exercise. BP Response:  Normal blood pressure response. Clinical Symptoms:  There is chest pain. ECG Impression:  No significant ST segment change suggestive of ischemia. Comparison with Prior Nuclear Study: No images to compare  Overall Impression:  Normal stress nuclear study.   Kirk Ruths

## 2011-07-02 NOTE — Progress Notes (Signed)
Normal stress test.  [name]

## 2011-07-04 NOTE — Progress Notes (Signed)
LMTCB

## 2011-07-07 ENCOUNTER — Ambulatory Visit (INDEPENDENT_AMBULATORY_CARE_PROVIDER_SITE_OTHER): Payer: Medicaid Other | Admitting: Physician Assistant

## 2011-07-07 ENCOUNTER — Encounter: Payer: Self-pay | Admitting: Physician Assistant

## 2011-07-07 VITALS — BP 164/82 | HR 71 | Ht 61.0 in | Wt 157.0 lb

## 2011-07-07 DIAGNOSIS — F17200 Nicotine dependence, unspecified, uncomplicated: Secondary | ICD-10-CM

## 2011-07-07 DIAGNOSIS — I2510 Atherosclerotic heart disease of native coronary artery without angina pectoris: Secondary | ICD-10-CM

## 2011-07-07 DIAGNOSIS — I10 Essential (primary) hypertension: Secondary | ICD-10-CM

## 2011-07-07 DIAGNOSIS — E785 Hyperlipidemia, unspecified: Secondary | ICD-10-CM

## 2011-07-07 MED ORDER — CARVEDILOL 6.25 MG PO TABS: 12.5000 mg | ORAL_TABLET | Freq: Two times a day (BID) | ORAL | Status: DC

## 2011-07-07 MED ORDER — CARVEDILOL 12.5 MG PO TABS: 12.5000 mg | ORAL_TABLET | Freq: Two times a day (BID) | ORAL | Status: DC

## 2011-07-07 NOTE — Assessment & Plan Note (Signed)
We again discussed the need for cessation.

## 2011-07-07 NOTE — Patient Instructions (Signed)
Your physician recommends that you schedule a follow-up appointment in: 08/08/11 @ 8:30 to see Richardson Dopp, PA-C  Your physician recommends that you return for lab work in: 08/06/11 fasting liver/lipid panel CAD  Your physician has recommended you make the following change in your medication: [place] 12.5 [place] wants you to follow-up in: [place]. Aundra Dubin. You will receive a reminder letter in the mail two months in advance. If you don't receive a letter, please call our office to schedule the follow-up appointment.

## 2011-07-07 NOTE — Assessment & Plan Note (Signed)
Much better controlled.  Increase coreg to 12.5 mg BID.  Follow up in one month.

## 2011-07-07 NOTE — Progress Notes (Signed)
History of Present Illness: Primary Cardiologist:  Dr. Loralie Champagne   [name] is a 50 y.o. female with a history of resistant HTN, diastolic CHF, and CAD.  She was recently restarted on her antihypertensive medications.  Coreg was continued.  She was started back on amlodipine 10 QD and hydralazine 50 mg t.i.d.  Ace inhibitor was not used secondary to CKD.  Lasix 40 mg daily was also restarted.  She was eventually taken off of Lasix due to renal insufficiency.  I have seen her twice now for follow up on BP.  She has not been taking her medications as directed.  When I last saw her, we had to give her clonidine in the office to get her BP down.   I had her undergo a Myoview.  This was done 9/11: no ischemia, EF 58%.  At last visit, we discussed the importance of treating her HTN.  She returns for follow up.  Labs (3/10): LDL 99, HDL 29, creatinine 1.4 Labs (9/11): K 3.6, creatinine 1.75 Labs (05/12/11): K 3.0, creat 2.3 Labs (05/20/11): K 3.7, creat 2.0 Labs (05/27/11):   K 3.9, creat 2.1 Labs (06/13/11): K 4, creat 1.8  BP looks much better today.  She is taking all of her medications.  The patient denies chest pain, shortness of breath, syncope, orthopnea, PND or significant pedal edema.   Past Medical History: 1. Hypertension (resistant). 2. Hypercholesterolemia. 3. Tobacco abuse.   4. Coronary artery disease.  The patient had NSTEMI in February 2009.  Left heart catheterization at that time showed a 70% distal LAD lesion that was hazy consistent with a plaque rupture, mCFX 30%, dCFX 50%, dRCA 95%, PLB 40%.  She did have a Promus drug-eluting stent placed in her distal RCA.   Lex. Myoview 9/12: no ischemia, EF 58%.   5. Chronic kidney disease (creat 2-2.3) 6. Remote intracranial hemorrhage.  H/o cerebral aneurysm clipping 7. Diastolic congestive heart failure.  Last echo (2/09) with EF 60%, severe LV hypertrophy, normal RV.  8. Low back pain/sciatica 9.  GERD   Current Outpatient  Prescriptions  Medication Sig Dispense Refill  . amLODipine (NORVASC) 10 MG tablet Take 1 tablet (10 mg total) by mouth daily. This is for blood pressure  30 tablet  6  . aspirin EC 81 MG tablet Take 1 tablet (81 mg total) by mouth daily. This is for your heart      . atorvastatin (LIPITOR) 80 MG tablet Take 1 tablet (80 mg total) by mouth daily. This is for cholesterol  30 tablet  3  . carvedilol (COREG) 6.25 MG tablet Take 1 tablet (6.25 mg total) by mouth 2 (two) times daily. This is for blood pressure  60 tablet  11  . esomeprazole (NEXIUM) 40 MG capsule Take 1 capsule (40 mg total) by mouth daily as needed. This is for your stomach  30 capsule  11  . hydrALAZINE (APRESOLINE) 50 MG tablet Take 1 tablet (50 mg total) by mouth 3 (three) times daily. This is for blood pressure  90 tablet  6  . nicotine (NICODERM CQ - DOSED IN MG/24 HOURS) 21 mg/24hr patch Place 1 patch onto the skin daily.        . nitroGLYCERIN (NITROSTAT) 0.4 MG SL tablet Place 1 tablet (0.4 mg total) under the tongue every 5 (five) minutes as needed.  25 tablet  12   Current Facility-Administered Medications  Medication Dose Route Frequency Provider Last Rate Last Dose  . cloNIDine (CATAPRES) tablet  0.2 mg  0.2 mg Oral 1 day or 1 dose Loralie Champagne, MD   0.2 mg at 04/18/11 1145    Allergies: Allergies  Allergen Reactions  . Ampicillin   . Penicillins     Vital Signs: BP 164/82  Pulse 71  Ht 5\' 1"  (1.549 m)  Wt 157 lb (71.215 kg)  BMI 29.67 kg/m2 Repeat BP by me: L 150/90; R 158/90  PHYSICAL EXAM: Well nourished, well developed, in no acute distress HEENT: normal Neck: no JVD Vascular: no carotid bruits Cardiac:  normal S1, S2; RRR; no murmur Lungs:  clear to auscultation bilaterally, no wheezing, rhonchi or rales Abd: soft, nontender, no hepatomegaly Ext: no edema Skin: warm and dry Neuro:  CNs 2-12 intact, no focal abnormalities noted Psych: Normal affect  ASSESSMENT AND PLAN:

## 2011-07-07 NOTE — Assessment & Plan Note (Signed)
Myoview normal.  No further chest pain.  No further workup.  Follow up with Dr. Aundra Dubin in 4 months.

## 2011-07-07 NOTE — Assessment & Plan Note (Addendum)
She was not taking atorvastatin when I met her the first time.  She is now back on all her meds.  Check FLP and LFTs at next OV.

## 2011-07-10 NOTE — Progress Notes (Signed)
LM on voice ID voice mail that test result was normal

## 2011-07-11 LAB — DIFFERENTIAL
Basophils Absolute: 0
Basophils Relative: 0
Eosinophils Absolute: 0.1
Eosinophils Relative: 1
Eosinophils Relative: 2
Lymphocytes Relative: 28
Lymphocytes Relative: 37
Lymphs Abs: 2.2
Monocytes Absolute: 0.2
Monocytes Absolute: 0.3
Monocytes Relative: 3
Monocytes Relative: 4
Neutro Abs: 4
Neutro Abs: 5.3
Neutrophils Relative %: 68

## 2011-07-11 LAB — CBC
HCT: 42.6
Hemoglobin: 14
Hemoglobin: 14.1
MCHC: 32.8
MCHC: 33.4
MCHC: 33.8
MCHC: 33.8
MCV: 88.7
MCV: 89.2
Platelets: 241
Platelets: 245
Platelets: 258
RBC: 4.12
RBC: 4.75
RBC: 4.77
RDW: 14
WBC: 7.3
WBC: 7.8

## 2011-07-11 LAB — BASIC METABOLIC PANEL
BUN: 10
BUN: 11
CO2: 22
CO2: 24
Calcium: 8.3 — ABNORMAL LOW
Calcium: 8.4
Calcium: 8.9
Calcium: 9.4
Chloride: 107
Creatinine, Ser: 1.25 — ABNORMAL HIGH
Creatinine, Ser: 1.28 — ABNORMAL HIGH
Creatinine, Ser: 1.29 — ABNORMAL HIGH
Creatinine, Ser: 1.53 — ABNORMAL HIGH
GFR calc Af Amer: 44 — ABNORMAL LOW
GFR calc Af Amer: 54 — ABNORMAL LOW
GFR calc Af Amer: 55 — ABNORMAL LOW
GFR calc Af Amer: 56 — ABNORMAL LOW
GFR calc non Af Amer: 45 — ABNORMAL LOW
Sodium: 141

## 2011-07-11 LAB — I-STAT 8, (EC8 V) (CONVERTED LAB)
Acid-base deficit: 3 — ABNORMAL HIGH
Chloride: 107
HCT: 47 — ABNORMAL HIGH
Hemoglobin: 16 — ABNORMAL HIGH
Potassium: 3.6
pH, Ven: 7.317 — ABNORMAL HIGH

## 2011-07-11 LAB — BASIC METABOLIC PANEL WITH GFR
CO2: 23
GFR calc non Af Amer: 46 — ABNORMAL LOW
Glucose, Bld: 102 — ABNORMAL HIGH
Potassium: 3.4 — ABNORMAL LOW
Sodium: 137

## 2011-07-11 LAB — POCT I-STAT CREATININE: Creatinine, Ser: 1.4 — ABNORMAL HIGH

## 2011-07-11 LAB — POCT CARDIAC MARKERS
CKMB, poc: 1.5
Myoglobin, poc: 85.6
Myoglobin, poc: 92.5

## 2011-07-11 LAB — CK TOTAL AND CKMB (NOT AT ARMC)
Relative Index: 3.5 — ABNORMAL HIGH
Relative Index: 4.7 — ABNORMAL HIGH
Total CK: 158

## 2011-07-11 LAB — LIPID PANEL
Total CHOL/HDL Ratio: 6
Triglycerides: 135
VLDL: 27

## 2011-07-11 LAB — CALCIUM: Calcium: 8.5

## 2011-07-11 LAB — CARDIAC PANEL(CRET KIN+CKTOT+MB+TROPI)
Total CK: 166
Troponin I: 0.75

## 2011-07-11 LAB — B-NATRIURETIC PEPTIDE (CONVERTED LAB): Pro B Natriuretic peptide (BNP): 206 — ABNORMAL HIGH

## 2011-07-11 LAB — D-DIMER, QUANTITATIVE: D-Dimer, Quant: 0.43

## 2011-07-11 LAB — PROTIME-INR: INR: 1.1

## 2011-07-11 LAB — HOMOCYSTEINE: Homocysteine: 10

## 2011-07-11 LAB — APTT: aPTT: 33

## 2011-07-11 LAB — TROPONIN I: Troponin I: 0.35 — ABNORMAL HIGH

## 2011-07-11 LAB — TSH: TSH: 1.075

## 2011-07-18 LAB — BASIC METABOLIC PANEL
BUN: 10
Calcium: 8.8
Creatinine, Ser: 1.19
GFR calc non Af Amer: 49 — ABNORMAL LOW
Glucose, Bld: 84

## 2011-07-18 LAB — POCT CARDIAC MARKERS
CKMB, poc: 1.7
CKMB, poc: 2.8
Myoglobin, poc: 112
Myoglobin, poc: 165
Troponin i, poc: <0.05
Troponin i, poc: <0.05

## 2011-07-18 LAB — CBC
HCT: 39.7
Hemoglobin: 12.9
MCHC: 32.6
MCV: 90
Platelets: 258
RBC: 4.41
RDW: 14.1
WBC: 6.4

## 2011-07-18 LAB — DIFFERENTIAL
Basophils Absolute: 0
Basophils Relative: 1
Eosinophils Absolute: 0.1
Eosinophils Relative: 2
Lymphocytes Relative: 37
Lymphs Abs: 2.3
Monocytes Absolute: 0.4
Monocytes Relative: 6
Neutro Abs: 3.5
Neutrophils Relative %: 55

## 2011-07-18 LAB — BASIC METABOLIC PANEL WITH GFR
CO2: 26
Chloride: 106
GFR calc Af Amer: 59 — ABNORMAL LOW
Potassium: 2.6 — CL
Sodium: 140

## 2011-07-30 LAB — COMPREHENSIVE METABOLIC PANEL
ALT: 14
AST: 17
Calcium: 9.3
Creatinine, Ser: 1.45 — ABNORMAL HIGH
GFR calc Af Amer: 47 — ABNORMAL LOW
Glucose, Bld: 94
Sodium: 140
Total Protein: 8.3

## 2011-07-30 LAB — BASIC METABOLIC PANEL
CO2: 24
Calcium: 8.5
Creatinine, Ser: 1.45 — ABNORMAL HIGH
GFR calc Af Amer: 47 — ABNORMAL LOW

## 2011-07-30 LAB — CBC
Hemoglobin: 12.6
MCHC: 31.9
MCHC: 33.7
MCV: 87.9
RBC: 4.26
RDW: 13.3

## 2011-07-30 LAB — LIPID PANEL
HDL: 30 — ABNORMAL LOW
Triglycerides: 80
VLDL: 16

## 2011-07-30 LAB — DIFFERENTIAL
Eosinophils Absolute: 0.3
Lymphocytes Relative: 40
Lymphs Abs: 3.2
Monocytes Relative: 3
Neutrophils Relative %: 51

## 2011-07-30 LAB — URINALYSIS, ROUTINE W REFLEX MICROSCOPIC
Nitrite: NEGATIVE
Specific Gravity, Urine: 1.01
pH: 7.5

## 2011-07-30 LAB — URINE MICROSCOPIC-ADD ON

## 2011-07-30 LAB — CARDIAC PANEL(CRET KIN+CKTOT+MB+TROPI)
CK, MB: 3
Relative Index: INVALID
Troponin I: 0.03

## 2011-07-30 LAB — TSH: TSH: 1.232

## 2011-08-06 ENCOUNTER — Other Ambulatory Visit: Payer: Medicaid Other | Admitting: *Deleted

## 2011-08-08 ENCOUNTER — Ambulatory Visit: Payer: Medicaid Other | Admitting: Physician Assistant

## 2012-03-03 ENCOUNTER — Encounter (HOSPITAL_COMMUNITY): Payer: Self-pay | Admitting: *Deleted

## 2012-03-03 ENCOUNTER — Encounter (HOSPITAL_COMMUNITY): Payer: Self-pay | Admitting: Anesthesiology

## 2012-03-03 ENCOUNTER — Emergency Department (HOSPITAL_COMMUNITY): Payer: No Typology Code available for payment source

## 2012-03-03 ENCOUNTER — Inpatient Hospital Stay (HOSPITAL_COMMUNITY)
Admission: EM | Admit: 2012-03-03 | Discharge: 2012-04-05 | DRG: 020 | Disposition: A | Discharge status: Rehabilitation Facility | Payer: No Typology Code available for payment source | Attending: Neurosurgery | Admitting: Neurosurgery

## 2012-03-03 ENCOUNTER — Inpatient Hospital Stay (HOSPITAL_COMMUNITY): Payer: No Typology Code available for payment source

## 2012-03-03 ENCOUNTER — Inpatient Hospital Stay (HOSPITAL_COMMUNITY): Payer: No Typology Code available for payment source | Admitting: Anesthesiology

## 2012-03-03 DIAGNOSIS — I5033 Acute on chronic diastolic (congestive) heart failure: Secondary | ICD-10-CM | POA: Insufficient documentation

## 2012-03-03 DIAGNOSIS — I609 Nontraumatic subarachnoid hemorrhage, unspecified: Principal | ICD-10-CM | POA: Diagnosis present

## 2012-03-03 DIAGNOSIS — G911 Obstructive hydrocephalus: Secondary | ICD-10-CM | POA: Diagnosis present

## 2012-03-03 DIAGNOSIS — Z23 Encounter for immunization: Secondary | ICD-10-CM

## 2012-03-03 DIAGNOSIS — F17200 Nicotine dependence, unspecified, uncomplicated: Secondary | ICD-10-CM | POA: Diagnosis present

## 2012-03-03 DIAGNOSIS — E876 Hypokalemia: Secondary | ICD-10-CM | POA: Diagnosis present

## 2012-03-03 DIAGNOSIS — E785 Hyperlipidemia, unspecified: Secondary | ICD-10-CM | POA: Insufficient documentation

## 2012-03-03 DIAGNOSIS — N189 Chronic kidney disease, unspecified: Secondary | ICD-10-CM | POA: Insufficient documentation

## 2012-03-03 DIAGNOSIS — Z9119 Patient's noncompliance with other medical treatment and regimen: Secondary | ICD-10-CM

## 2012-03-03 DIAGNOSIS — Z8673 Personal history of transient ischemic attack (TIA), and cerebral infarction without residual deficits: Secondary | ICD-10-CM

## 2012-03-03 DIAGNOSIS — N182 Chronic kidney disease, stage 2 (mild): Secondary | ICD-10-CM | POA: Diagnosis present

## 2012-03-03 DIAGNOSIS — K219 Gastro-esophageal reflux disease without esophagitis: Secondary | ICD-10-CM | POA: Diagnosis present

## 2012-03-03 DIAGNOSIS — I161 Hypertensive emergency: Secondary | ICD-10-CM

## 2012-03-03 DIAGNOSIS — E782 Mixed hyperlipidemia: Secondary | ICD-10-CM | POA: Diagnosis present

## 2012-03-03 DIAGNOSIS — Z7982 Long term (current) use of aspirin: Secondary | ICD-10-CM

## 2012-03-03 DIAGNOSIS — G9340 Encephalopathy, unspecified: Secondary | ICD-10-CM | POA: Diagnosis present

## 2012-03-03 DIAGNOSIS — I10 Essential (primary) hypertension: Secondary | ICD-10-CM

## 2012-03-03 DIAGNOSIS — I2510 Atherosclerotic heart disease of native coronary artery without angina pectoris: Secondary | ICD-10-CM | POA: Diagnosis present

## 2012-03-03 DIAGNOSIS — I4891 Unspecified atrial fibrillation: Secondary | ICD-10-CM | POA: Diagnosis not present

## 2012-03-03 DIAGNOSIS — J9600 Acute respiratory failure, unspecified whether with hypoxia or hypercapnia: Secondary | ICD-10-CM

## 2012-03-03 DIAGNOSIS — R5381 Other malaise: Secondary | ICD-10-CM | POA: Diagnosis not present

## 2012-03-03 DIAGNOSIS — Z8679 Personal history of other diseases of the circulatory system: Secondary | ICD-10-CM | POA: Diagnosis present

## 2012-03-03 DIAGNOSIS — R1310 Dysphagia, unspecified: Secondary | ICD-10-CM | POA: Diagnosis not present

## 2012-03-03 DIAGNOSIS — I252 Old myocardial infarction: Secondary | ICD-10-CM

## 2012-03-03 DIAGNOSIS — I701 Atherosclerosis of renal artery: Secondary | ICD-10-CM | POA: Diagnosis not present

## 2012-03-03 DIAGNOSIS — N179 Acute kidney failure, unspecified: Secondary | ICD-10-CM | POA: Diagnosis present

## 2012-03-03 DIAGNOSIS — Z9861 Coronary angioplasty status: Secondary | ICD-10-CM

## 2012-03-03 DIAGNOSIS — E46 Unspecified protein-calorie malnutrition: Secondary | ICD-10-CM | POA: Diagnosis not present

## 2012-03-03 DIAGNOSIS — E669 Obesity, unspecified: Secondary | ICD-10-CM | POA: Diagnosis present

## 2012-03-03 DIAGNOSIS — Z79899 Other long term (current) drug therapy: Secondary | ICD-10-CM

## 2012-03-03 DIAGNOSIS — R7309 Other abnormal glucose: Secondary | ICD-10-CM | POA: Diagnosis present

## 2012-03-03 DIAGNOSIS — I5032 Chronic diastolic (congestive) heart failure: Secondary | ICD-10-CM

## 2012-03-03 DIAGNOSIS — Z91199 Patient's noncompliance with other medical treatment and regimen due to unspecified reason: Secondary | ICD-10-CM

## 2012-03-03 DIAGNOSIS — I129 Hypertensive chronic kidney disease with stage 1 through stage 4 chronic kidney disease, or unspecified chronic kidney disease: Secondary | ICD-10-CM | POA: Diagnosis present

## 2012-03-03 DIAGNOSIS — I498 Other specified cardiac arrhythmias: Secondary | ICD-10-CM | POA: Diagnosis not present

## 2012-03-03 DIAGNOSIS — E872 Acidosis, unspecified: Secondary | ICD-10-CM | POA: Diagnosis not present

## 2012-03-03 DIAGNOSIS — F09 Unspecified mental disorder due to known physiological condition: Secondary | ICD-10-CM | POA: Diagnosis not present

## 2012-03-03 HISTORY — DX: Nontraumatic subarachnoid hemorrhage, unspecified: I60.9

## 2012-03-03 LAB — BASIC METABOLIC PANEL
BUN: 18 mg/dL (ref 6–23)
CO2: 18 mEq/L — ABNORMAL LOW (ref 19–32)
Chloride: 106 mEq/L (ref 96–112)
Creatinine, Ser: 1.76 mg/dL — ABNORMAL HIGH (ref 0.50–1.10)
Glucose, Bld: 126 mg/dL — ABNORMAL HIGH (ref 70–99)
Potassium: 3.5 mEq/L (ref 3.5–5.1)

## 2012-03-03 LAB — CBC
HCT: 42.9 % (ref 36.0–46.0)
Hemoglobin: 14.7 g/dL (ref 12.0–15.0)
MCH: 29.3 pg (ref 26.0–34.0)
MCV: 85.6 fL (ref 78.0–100.0)
Platelets: 215 10*3/uL (ref 150–400)
RBC: 5.01 MIL/uL (ref 3.87–5.11)

## 2012-03-03 LAB — BLOOD GAS, ARTERIAL
Drawn by: 34764
MECHVT: 400 mL
O2 Saturation: 99.8 %
PEEP: 5 cmH2O
RATE: 16 resp/min
pCO2 arterial: 42.3 mmHg (ref 35.0–45.0)
pO2, Arterial: 348 mmHg — ABNORMAL HIGH (ref 80.0–100.0)

## 2012-03-03 LAB — DIFFERENTIAL
Band Neutrophils: 0 % (ref 0–10)
Basophils Absolute: 0.4 10*3/uL — ABNORMAL HIGH (ref 0.0–0.1)
Eosinophils Relative: 4 % (ref 0–5)
Lymphs Abs: 35.3 10*3/uL — ABNORMAL HIGH (ref 0.7–4.0)
Metamyelocytes Relative: 0 %
Monocytes Relative: 4 % (ref 3–12)
Myelocytes: 0 %
Neutro Abs: 55.8 10*3/uL — ABNORMAL HIGH (ref 1.7–7.7)
nRBC: 0 /100 WBC

## 2012-03-03 LAB — CK TOTAL AND CKMB (NOT AT ARMC)
CK, MB: 3.9 ng/mL (ref 0.3–4.0)
Relative Index: 1.9 (ref 0.0–2.5)

## 2012-03-03 LAB — PROTIME-INR: Prothrombin Time: 13.2 seconds (ref 11.6–15.2)

## 2012-03-03 LAB — GLUCOSE, CAPILLARY: Glucose-Capillary: 158 mg/dL — ABNORMAL HIGH (ref 70–99)

## 2012-03-03 LAB — APTT: aPTT: 30 seconds (ref 24–37)

## 2012-03-03 MED ORDER — NICARDIPINE HCL IN NACL 20-0.86 MG/200ML-% IV SOLN: 5.0000 mg/h | Freq: Once | INTRAVENOUS | Status: DC

## 2012-03-03 MED ORDER — PANTOPRAZOLE SODIUM 40 MG IV SOLR
40.0000 mg | Freq: Every day | INTRAVENOUS | Status: DC
  Administered 2012-03-04: 40 mg via INTRAVENOUS
  Filled 2012-03-03 (×3): qty 40

## 2012-03-03 MED ORDER — MORPHINE SULFATE 4 MG/ML IJ SOLN
INTRAMUSCULAR | Status: AC
  Administered 2012-03-04: 10 mg
  Filled 2012-03-03: qty 1

## 2012-03-03 MED ORDER — SODIUM CHLORIDE 0.9 % IV SOLN: INTRAVENOUS | Status: DC

## 2012-03-03 MED ORDER — ONDANSETRON HCL 4 MG/2ML IJ SOLN
4.0000 mg | Freq: Four times a day (QID) | INTRAMUSCULAR | Status: DC | PRN
  Administered 2012-03-04 – 2012-03-22 (×5): 4 mg via INTRAVENOUS
  Filled 2012-03-03 (×5): qty 2

## 2012-03-03 MED ORDER — FENTANYL CITRATE 0.05 MG/ML IJ SOLN
50.0000 ug/h | INTRAMUSCULAR | Status: DC
  Filled 2012-03-03: qty 50

## 2012-03-03 MED ORDER — NICARDIPINE HCL IN NACL 20-0.86 MG/200ML-% IV SOLN: 5.0000 mg/h | INTRAVENOUS | Status: DC

## 2012-03-03 MED ORDER — SODIUM CHLORIDE 0.9 % IV SOLN
500.0000 mg | Freq: Two times a day (BID) | INTRAVENOUS | Status: DC
  Administered 2012-03-04 – 2012-03-06 (×6): 500 mg via INTRAVENOUS
  Filled 2012-03-03 (×7): qty 5

## 2012-03-03 MED ORDER — SENNOSIDES-DOCUSATE SODIUM 8.6-50 MG PO TABS
1.0000 | ORAL_TABLET | Freq: Two times a day (BID) | ORAL | Status: DC
  Administered 2012-03-04 – 2012-04-05 (×57): 1 via ORAL
  Filled 2012-03-03 (×67): qty 1

## 2012-03-03 MED ORDER — MORPHINE SULFATE 4 MG/ML IJ SOLN
4.0000 mg | Freq: Once | INTRAMUSCULAR | Status: AC
  Administered 2012-03-03: 4 mg via INTRAVENOUS
  Filled 2012-03-03: qty 1

## 2012-03-03 MED ORDER — ONDANSETRON HCL 4 MG/2ML IJ SOLN
INTRAMUSCULAR | Status: AC
  Filled 2012-03-03: qty 2

## 2012-03-03 MED ORDER — LABETALOL HCL 5 MG/ML IV SOLN
10.0000 mg | INTRAVENOUS | Status: DC | PRN
Start: 2012-03-03 — End: 2012-03-03

## 2012-03-03 MED ORDER — HYDRALAZINE HCL 20 MG/ML IJ SOLN
5.0000 mg | INTRAMUSCULAR | Status: DC
  Administered 2012-03-03: 5 mg via INTRAVENOUS

## 2012-03-03 MED ORDER — MORPHINE SULFATE 2 MG/ML IJ SOLN
1.0000 mg | INTRAMUSCULAR | Status: DC | PRN
  Administered 2012-03-04 – 2012-03-08 (×11): 2 mg via INTRAVENOUS
  Administered 2012-03-08: 4 mg via INTRAVENOUS
  Administered 2012-03-09: 2 mg via INTRAVENOUS
  Administered 2012-03-09 (×2): 4 mg via INTRAVENOUS
  Administered 2012-03-10 – 2012-03-13 (×10): 2 mg via INTRAVENOUS
  Administered 2012-03-14: 4 mg via INTRAVENOUS
  Administered 2012-03-14 – 2012-03-17 (×10): 2 mg via INTRAVENOUS
  Administered 2012-03-17: 4 mg via INTRAVENOUS
  Administered 2012-03-17 – 2012-03-18 (×3): 2 mg via INTRAVENOUS
  Administered 2012-03-18: 4 mg via INTRAVENOUS
  Administered 2012-03-19 – 2012-03-21 (×6): 2 mg via INTRAVENOUS
  Administered 2012-03-22: 4 mg via INTRAVENOUS
  Administered 2012-03-22 (×2): 2 mg via INTRAVENOUS
  Administered 2012-03-22: 3 mg via INTRAVENOUS
  Administered 2012-03-22: 2 mg via INTRAVENOUS
  Administered 2012-03-22: 3 mg via INTRAVENOUS
  Administered 2012-03-23 – 2012-03-24 (×3): 2 mg via INTRAVENOUS
  Administered 2012-03-24: 1 mg via INTRAVENOUS
  Administered 2012-03-25: 2 mg via INTRAVENOUS
  Administered 2012-03-25 (×3): 4 mg via INTRAVENOUS
  Administered 2012-03-25: 3 mg via INTRAVENOUS
  Administered 2012-03-25 – 2012-03-26 (×4): 4 mg via INTRAVENOUS
  Administered 2012-03-26: 2 mg via INTRAVENOUS
  Filled 2012-03-03 (×2): qty 1
  Filled 2012-03-03: qty 2
  Filled 2012-03-03 (×6): qty 1
  Filled 2012-03-03: qty 2
  Filled 2012-03-03 (×7): qty 1
  Filled 2012-03-03: qty 2
  Filled 2012-03-03 (×2): qty 1
  Filled 2012-03-03: qty 2
  Filled 2012-03-03 (×3): qty 1
  Filled 2012-03-03: qty 2
  Filled 2012-03-03 (×2): qty 1
  Filled 2012-03-03 (×2): qty 2
  Filled 2012-03-03 (×2): qty 1
  Filled 2012-03-03: qty 2
  Filled 2012-03-03: qty 1
  Filled 2012-03-03 (×2): qty 2
  Filled 2012-03-03: qty 1
  Filled 2012-03-03: qty 2
  Filled 2012-03-03 (×4): qty 1
  Filled 2012-03-03: qty 2
  Filled 2012-03-03 (×8): qty 1
  Filled 2012-03-03: qty 2
  Filled 2012-03-03 (×2): qty 1
  Filled 2012-03-03: qty 2
  Filled 2012-03-03: qty 1
  Filled 2012-03-03: qty 3
  Filled 2012-03-03: qty 1
  Filled 2012-03-03 (×2): qty 2
  Filled 2012-03-03 (×8): qty 1
  Filled 2012-03-03 (×2): qty 2
  Filled 2012-03-03 (×2): qty 1

## 2012-03-03 MED ORDER — LABETALOL HCL 5 MG/ML IV SOLN
20.0000 mg | Freq: Once | INTRAVENOUS | Status: AC
  Administered 2012-03-03: 20 mg via INTRAVENOUS

## 2012-03-03 MED ORDER — SODIUM CHLORIDE 0.9 % IJ SOLN
1.5000 mg | INTRAMUSCULAR | Status: DC
  Filled 2012-03-03: qty 0.3

## 2012-03-03 MED ORDER — NICARDIPINE HCL IN NACL 20-0.86 MG/200ML-% IV SOLN
5.0000 mg/h | Freq: Once | INTRAVENOUS | Status: AC
  Administered 2012-03-03: 10 mg/h via INTRAVENOUS
  Administered 2012-03-03: 5 mg/h via INTRAVENOUS
  Filled 2012-03-03: qty 200

## 2012-03-03 MED ORDER — SUCCINYLCHOLINE CHLORIDE 20 MG/ML IJ SOLN
INTRAMUSCULAR | Status: DC | PRN
  Administered 2012-03-03: 100 mg via INTRAVENOUS

## 2012-03-03 MED ORDER — ONDANSETRON HCL 4 MG/2ML IJ SOLN
4.0000 mg | Freq: Once | INTRAMUSCULAR | Status: AC
  Administered 2012-03-03: 4 mg via INTRAVENOUS

## 2012-03-03 MED ORDER — PROPOFOL 10 MG/ML IV EMUL: 5.0000 ug/kg/min | INTRAVENOUS | Status: DC

## 2012-03-03 MED ORDER — CHLORHEXIDINE GLUCONATE 0.12 % MT SOLN
OROMUCOSAL | Status: AC
  Filled 2012-03-03: qty 15

## 2012-03-03 MED ORDER — LABETALOL HCL 5 MG/ML IV SOLN
40.0000 mg | Freq: Once | INTRAVENOUS | Status: AC
  Administered 2012-03-03: 20 mg via INTRAVENOUS
  Filled 2012-03-03: qty 4

## 2012-03-03 MED ORDER — ACETAMINOPHEN 325 MG PO TABS
650.0000 mg | ORAL_TABLET | ORAL | Status: DC | PRN
  Administered 2012-03-08 – 2012-04-03 (×15): 650 mg via ORAL
  Filled 2012-03-03 (×15): qty 2

## 2012-03-03 MED ORDER — LABETALOL HCL 5 MG/ML IV SOLN
INTRAVENOUS | Status: AC
  Filled 2012-03-03: qty 4

## 2012-03-03 MED ORDER — PROPOFOL 10 MG/ML IV BOLUS
INTRAVENOUS | Status: DC | PRN
  Administered 2012-03-03: 200 mg via INTRAVENOUS

## 2012-03-03 MED ORDER — CEFAZOLIN SODIUM 1-5 GM-% IV SOLN
1.0000 g | Freq: Three times a day (TID) | INTRAVENOUS | Status: DC
  Administered 2012-03-04 – 2012-03-05 (×4): 1 g via INTRAVENOUS
  Filled 2012-03-03 (×8): qty 50

## 2012-03-03 MED ORDER — POTASSIUM CHLORIDE 20 MEQ/15ML (10%) PO LIQD
40.0000 meq | Freq: Once | ORAL | Status: DC
  Filled 2012-03-03: qty 30

## 2012-03-03 MED ORDER — ONDANSETRON HCL 4 MG/2ML IJ SOLN
INTRAMUSCULAR | Status: AC
  Administered 2012-03-03: 18:00:00
  Filled 2012-03-03: qty 2

## 2012-03-03 MED ORDER — LABETALOL HCL 5 MG/ML IV SOLN
80.0000 mg | Freq: Once | INTRAVENOUS | Status: AC
  Administered 2012-03-03: 20 mg via INTRAVENOUS
  Filled 2012-03-03: qty 16

## 2012-03-03 MED ORDER — NICARDIPINE HCL IN NACL 20-0.86 MG/200ML-% IV SOLN
5.0000 mg/h | INTRAVENOUS | Status: DC
  Filled 2012-03-03 (×2): qty 200

## 2012-03-03 MED ORDER — HYDRALAZINE HCL 20 MG/ML IJ SOLN
10.0000 mg | Freq: Once | INTRAMUSCULAR | Status: DC
  Filled 2012-03-03: qty 0.5
  Filled 2012-03-03 (×2): qty 1

## 2012-03-03 MED ORDER — ACETAMINOPHEN 650 MG RE SUPP
650.0000 mg | RECTAL | Status: DC | PRN
  Administered 2012-03-14: 650 mg via RECTAL
  Filled 2012-03-03: qty 1

## 2012-03-03 MED ORDER — PROPOFOL 10 MG/ML IV EMUL
5.0000 ug/kg/min | INTRAVENOUS | Status: DC
Start: 2012-03-03 — End: 2012-03-04
  Administered 2012-03-04: 25 ug/kg/min via INTRAVENOUS
  Administered 2012-03-04: 50 ug/kg/min via INTRAVENOUS
  Filled 2012-03-03 (×4): qty 100

## 2012-03-03 MED ORDER — INSULIN ASPART 100 UNIT/ML ~~LOC~~ SOLN
0.0000 [IU] | SUBCUTANEOUS | Status: DC
  Administered 2012-03-05 (×2): 2 [IU] via SUBCUTANEOUS
  Administered 2012-03-05 – 2012-03-06 (×3): 3 [IU] via SUBCUTANEOUS
  Administered 2012-03-06: 2 [IU] via SUBCUTANEOUS
  Administered 2012-03-06: 3 [IU] via SUBCUTANEOUS
  Administered 2012-03-06: 1 [IU] via SUBCUTANEOUS

## 2012-03-03 MED ORDER — PROPOFOL 10 MG/ML IV EMUL
INTRAVENOUS | Status: AC
  Filled 2012-03-03: qty 100

## 2012-03-03 MED ORDER — FENTANYL BOLUS VIA INFUSION
50.0000 ug | Freq: Four times a day (QID) | INTRAVENOUS | Status: DC | PRN
  Filled 2012-03-03: qty 100

## 2012-03-03 NOTE — Progress Notes (Signed)
[facility] Progress Note Patient Name: [name] DOB: 20-Feb-1961 MRN: [id number]  Date of Service  03/03/2012   HPI/Events of Note   SAH, HTNive patient on vent.  Asked for PCCM consult  eICU Interventions  See vent orders. Full PCCM note to follow   Intervention Category Major Interventions: Respiratory failure - evaluation and management  Asencion Noble 03/03/2012, 9:29 PM

## 2012-03-03 NOTE — ED Notes (Signed)
Pharmacy advised to wait until nicardipine is started to give labetalol.  EDP informed.

## 2012-03-03 NOTE — H&P (Signed)
[name] is an 51 y.o. female.   Chief Complaint: headache, nausea, vomiting, subarachnoid hemorrhage HPI: [name] was in her usual state of health until ~1700 today. She felt a headache, was nauseated and vomited. She was conscious and following commands at that time. She was brought to the [facility] hospital and was noted to have a Blood pressure > 300. A head ct was ordered and revealed a diffuse SAH with ventriculomegaly. She was noted to have a GCS of 15, though this was not a traumatic injury. I was consulted after the CT was performed.  Past Medical History  Diagnosis Date  . HTN (hypertension)     resistant  . Hypercholesterolemia   . Tobacco abuse     resumed smoking half a pack a day. She was a smoker in the past and states she was abl eto stop in the past using nicotine patches  . CAD (coronary artery disease)     Pt has St elevation MI in Feb 2009. Left hear tcath at tha ttime showed a 70% distal LAD lesion that was hazy consistent with a plaque rupture. She had a 50% distal circumflex stenosis an a 95% distal RCA stenosis. She did havve drug eluting stent placed in her distal RCA  . Chronic kidney disease     most recent creatinine 1.4 in 3/10  . Intracranial hemorrhage, spontaneous intraparenchymal, idiopathic, remote, resolved   . Diastolic heart failure     pt most recent echo wasin Feb 2009 w an EF of 60% and severe left ventricular hypertrophy. The pt did have evidence of diastolic dysfunction. The RV appeared normal    History reviewed. No pertinent past surgical history.  Family History  Problem Relation Age of Onset  . Coronary artery disease Other     premature in 1st degree relatives   Social History:  reports that she has been smoking.  She does not have any smokeless tobacco history on file. She reports that she does not drink alcohol or use illicit drugs.  Allergies:  Allergies  Allergen Reactions  . Ampicillin Other (See Comments)    unknown  . Penicillins  Other (See Comments)    unknown    Medications Prior to Admission  Medication Sig Dispense Refill  . amLODipine (NORVASC) 10 MG tablet Take 1 tablet (10 mg total) by mouth daily. This is for blood pressure  30 tablet  6  . aspirin EC 81 MG tablet Take 1 tablet (81 mg total) by mouth daily. This is for your heart      . carvedilol (COREG) 12.5 MG tablet Take 1 tablet (12.5 mg total) by mouth 2 (two) times daily with a meal.  60 tablet  11  . hydrALAZINE (APRESOLINE) 50 MG tablet Take 1 tablet (50 mg total) by mouth 3 (three) times daily. This is for blood pressure  90 tablet  6  . nitroGLYCERIN (NITROSTAT) 0.4 MG SL tablet Place 0.4 mg under the tongue every 5 (five) minutes as needed. For chest pain        Results for orders placed during the hospital encounter of 03/03/12 (from the past 48 hour(s))  CBC     Status: Normal   Collection Time   03/03/12  6:30 PM      Component Value Range Comment   WBC 7.1  4.0 - 10.5 (K/uL)    RBC 5.01  3.87 - 5.11 (MIL/uL)    Hemoglobin 14.7  12.0 - 15.0 (g/dL)    HCT 42.9  36.0 - 46.0 (%)    MCV 85.6  78.0 - 100.0 (fL)    MCH 29.3  26.0 - 34.0 (pg)    MCHC 34.3  30.0 - 36.0 (g/dL)    RDW 14.6  11.5 - 15.5 (%)    Platelets 215  150 - 400 (K/uL)   BASIC METABOLIC PANEL     Status: Abnormal   Collection Time   03/03/12  6:30 PM      Component Value Range Comment   Sodium 138  135 - 145 (mEq/L)    Potassium 3.5  3.5 - 5.1 (mEq/L)    Chloride 106  96 - 112 (mEq/L)    CO2 18 (*) 19 - 32 (mEq/L)    Glucose, Bld 126 (*) 70 - 99 (mg/dL)    BUN 18  6 - 23 (mg/dL)    Creatinine, Ser 1.76 (*) 0.50 - 1.10 (mg/dL)    Calcium 9.3  8.4 - 10.5 (mg/dL)    GFR calc non Af Amer 33 (*) >90 (mL/min)    GFR calc Af Amer 38 (*) >90 (mL/min)   PROTIME-INR     Status: Normal   Collection Time   03/03/12  6:30 PM      Component Value Range Comment   Prothrombin Time 13.2  11.6 - 15.2 (seconds)    INR 0.98  0.00 - 1.49    APTT     Status: Normal   Collection Time     03/03/12  6:30 PM      Component Value Range Comment   aPTT 30  24 - 37 (seconds)   DIFFERENTIAL     Status: Abnormal   Collection Time   03/03/12  6:30 PM      Component Value Range Comment   Neutro Abs 55.8 (*) 1.7 - 7.7 (K/uL)    Lymphs Abs 35.3 (*) 0.7 - 4.0 (K/uL)    Monocytes Absolute 4.3 (*) 0.1 - 1.0 (K/uL)    Eosinophils Absolute 4.2 (*) 0.0 - 0.7 (K/uL)    Basophils Absolute 0.4 (*) 0.0 - 0.1 (K/uL)    Neutrophils Relative 56  43 - 77 (%)    Lymphocytes Relative 35  12 - 46 (%)    Monocytes Relative 4  3 - 12 (%)    Eosinophils Relative 4  0 - 5 (%)    Basophils Relative 1  0 - 1 (%)    Band Neutrophils 0  0 - 10 (%)    Metamyelocytes Relative 0      Myelocytes 0      Promyelocytes Absolute 0      Blasts 0      nRBC 0  0 (/100 WBC)    WBC Morphology ATYPICAL LYMPHOCYTES     TROPONIN I     Status: Normal   Collection Time   03/03/12  6:31 PM      Component Value Range Comment   Troponin I <0.30  <0.30 (ng/mL)   CK TOTAL AND CKMB     Status: Abnormal   Collection Time   03/03/12  6:31 PM      Component Value Range Comment   Total CK 207 (*) 7 - 177 (U/L)    CK, MB 3.9  0.3 - 4.0 (ng/mL)    Relative Index 1.9  0.0 - 2.5    GLUCOSE, CAPILLARY     Status: Abnormal   Collection Time   03/03/12  7:19 PM      Component Value Range Comment  Glucose-Capillary 158 (*) 70 - 99 (mg/dL)    Ct Head Wo Contrast  03/03/2012  *RADIOLOGY REPORT*  Clinical Data: Sudden offset of the headache, nausea, and vomiting. Prominent hypertension.  History of aneurysm with intervention.  CT HEAD WITHOUT CONTRAST  Technique:  Contiguous axial images were obtained from the base of the skull through the vertex without contrast.  Comparison: 06/22/2010  Findings: Diffuse subarachnoid hemorrhage is observed in the sylvian fissures, suprasellar cistern, interpeduncular notch, ambient cistern, fourth ventricle, and probably posteriorly in the right lateral ventricle.  Images present within the third  ventricle, and there is poor delineation of sulci in both hemispheres potentially from edema/swelling or low-level diffuse subarachnoid hemorrhage.  Small remote lacunar infarct in the left cerebellum appears stable. There is subarachnoid hemorrhage anteriorly along the falx. 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 Subarachnoid hemorrhage layers along the tentorium.  No loss of gray-white differentiation is observed to suggest acute stroke at this time.  No mass lesion is observed.  There is slight prominence of the temporal horns of the lateral ventricles.  IMPRESSION:  1.  Diffuse subarachnoid hemorrhage in the basilar cisterns, Sylvian fissures, along the tentorium and anterior falx, in the third and fourth ventricle, and right lateral ventricle posteriorly.  Given the history of prior aneurysm clipping, aneurysm rupture would be suspected. Neurosurgical consultation and / or cerebral angiography may be indicated. 2.  Mild dilatation of the temporal horns lateral ventricles, potentially an early sign of hydrocephalus. Critical Value/emergent results were called by telephone to Dr. Riki Altes (who verbally acknowledged these results) at the time of interpretation on 03/03/2012 at 6:40 p.m.  Original Report Authenticated By: Carron Curie, M.D.    Review of Systems  Constitutional: Negative.   HENT: Negative.   Eyes: Negative.   Respiratory: Negative.   Cardiovascular: Negative.   Gastrointestinal: Negative.   Genitourinary: Negative.   Musculoskeletal: Negative.   Skin: Negative.   Neurological:       Headache  Endo/Heme/Allergies: Negative.   Psychiatric/Behavioral: Negative.     Blood pressure 292/138, pulse 63, temperature 97.4 F (36.3 C), temperature source Oral, resp. rate 16, SpO2 98.00%. Physical Exam Lethargic, arouses to voice. Following all commands.  [place], full eom.  Symmetric facies, hearing intact to voice.  Tongue and uvula midline Moving  all extremities.  Lungs, clear Heart: regular rhythm and rate Abdomen soft not tender. No cce in the extremities Assessment/Plan Admit to ICU, place ventricular catheter Needs 4 vessel cerebral angiogram Blood pressure control.  Dayden Viverette L 03/03/2012, 8:05 PM

## 2012-03-03 NOTE — ED Notes (Signed)
Pt from home.  States that she had sudden onset of 'head rush' and pounding in her head and nausea-vomiting x 2.  Pt reports hx of same-treated with nitro paste.  1sL nitro given en route-1SL nitro taken PTA.  No change in BP.  No other symptoms.  4mg  zofran.  20ga (R) hand.

## 2012-03-03 NOTE — ED Provider Notes (Signed)
History     CSN: [id number]  Arrival date & time 03/03/12  36   First MD Initiated Contact with Patient 03/03/12 1754      Chief Complaint  Patient presents with  . Hypertension    (Consider location/radiation/quality/duration/timing/severity/associated sxs/prior treatment) HPI Comments: Hx of SAH.  Hypertensive today and has abrupt onset headache.  Sleepy and vomiting since then.  Patient is a 51 y.o. female presenting with headaches. The history is provided by the patient.  Headache  This is a new problem. The current episode started less than 1 hour ago. The problem occurs constantly. The problem has not changed since onset.The headache is associated with nothing. The pain is moderate. Associated symptoms include nausea and vomiting. Pertinent negatives include no fever and no shortness of breath. She has tried nothing for the symptoms.    Past Medical History  Diagnosis Date  . HTN (hypertension)     resistant  . Hypercholesterolemia   . Tobacco abuse     resumed smoking half a pack a day. She was a smoker in the past and states she was abl eto stop in the past using nicotine patches  . CAD (coronary artery disease)     Pt has St elevation MI in Feb 2009. Left hear tcath at tha ttime showed a 70% distal LAD lesion that was hazy consistent with a plaque rupture. She had a 50% distal circumflex stenosis an a 95% distal RCA stenosis. She did havve drug eluting stent placed in her distal RCA  . Chronic kidney disease     most recent creatinine 1.4 in 3/10  . Intracranial hemorrhage, spontaneous intraparenchymal, idiopathic, remote, resolved   . Diastolic heart failure     pt most recent echo wasin Feb 2009 w an EF of 60% and severe left ventricular hypertrophy. The pt did have evidence of diastolic dysfunction. The RV appeared normal    History reviewed. No pertinent past surgical history.  Family History  Problem Relation Age of Onset  . Coronary artery disease Other    premature in 1st degree relatives    History  Substance Use Topics  . Smoking status: Current Some Day Smoker  . Smokeless tobacco: Not on file   Comment: 1/2 ppd   . Alcohol Use: No    OB History    Grav Para Term Preterm Abortions TAB SAB Ect Mult Living                  Review of Systems  Constitutional: Negative for fever and activity change.  HENT: Negative for congestion.   Eyes: Negative for visual disturbance.  Respiratory: Negative for chest tightness and shortness of breath.   Cardiovascular: Negative for chest pain and leg swelling.  Gastrointestinal: Positive for nausea and vomiting. Negative for abdominal pain.  Genitourinary: Negative for dysuria.  Skin: Negative for rash.  Neurological: Positive for headaches. Negative for syncope.  Psychiatric/Behavioral: Negative for behavioral problems.    Allergies  Ampicillin and Penicillins  Home Medications   No current outpatient prescriptions on file.  BP 292/138  Pulse 63  Temp(Src) 97.4 F (36.3 C) (Oral)  Resp 16  SpO2 98%  Physical Exam  Constitutional: She is oriented to person, place, and time. She appears well-developed and well-nourished.       sleepy  HENT:  Head: Normocephalic and atraumatic.  Eyes: Conjunctivae and EOM are normal. Pupils are equal, round, and reactive to light. No scleral icterus.  Neck: Normal range of motion. Neck supple.  Cardiovascular: Normal rate and regular rhythm.  Exam reveals no gallop and no friction rub.   No murmur heard. Pulmonary/Chest: Effort normal and breath sounds normal. No respiratory distress. She has no wheezes. She has no rales. She exhibits no tenderness.  Abdominal: Soft. She exhibits no distension and no mass. There is no tenderness. There is no rebound and no guarding.  Musculoskeletal: Normal range of motion.  Neurological: She is alert and oriented to person, place, and time. She has normal reflexes. No cranial nerve deficit.       GCS 15.  5/5  strength in all extremities.  Skin: Skin is warm and dry. No rash noted.  Psychiatric: She has a normal mood and affect. Her behavior is normal. Judgment and thought content normal.    ED Course  Procedures (including critical care time)  Labs Reviewed  BASIC METABOLIC PANEL - Abnormal; Notable for the following:    CO2 18 (*)    Glucose, Bld 126 (*)    Creatinine, Ser 1.76 (*)    GFR calc non Af Amer 33 (*)    GFR calc Af Amer 38 (*)    All other components within normal limits  DIFFERENTIAL - Abnormal; Notable for the following:    Neutro Abs 55.8 (*)    Lymphs Abs 35.3 (*)    Monocytes Absolute 4.3 (*)    Eosinophils Absolute 4.2 (*)    Basophils Absolute 0.4 (*)    All other components within normal limits  CK TOTAL AND CKMB - Abnormal; Notable for the following:    Total CK 207 (*)    All other components within normal limits  GLUCOSE, CAPILLARY - Abnormal; Notable for the following:    Glucose-Capillary 158 (*)    All other components within normal limits  CBC  TROPONIN I  PROTIME-INR  APTT  URINE RAPID DRUG SCREEN (HOSP PERFORMED)   Ct Head Wo Contrast  03/03/2012  *RADIOLOGY REPORT*  Clinical Data: Sudden offset of the headache, nausea, and vomiting. Prominent hypertension.  History of aneurysm with intervention.  CT HEAD WITHOUT CONTRAST  Technique:  Contiguous axial images were obtained from the base of the skull through the vertex without contrast.  Comparison: 06/22/2010  Findings: Diffuse subarachnoid hemorrhage is observed in the sylvian fissures, suprasellar cistern, interpeduncular notch, ambient cistern, fourth ventricle, and probably posteriorly in the right lateral ventricle.  Images present within the third ventricle, and there is poor delineation of sulci in both hemispheres potentially from edema/swelling or low-level diffuse subarachnoid hemorrhage.  Small remote lacunar infarct in the left cerebellum appears stable. There is subarachnoid hemorrhage  anteriorly along the falx. 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 Subarachnoid hemorrhage layers along the tentorium.  No loss of gray-white differentiation is observed to suggest acute stroke at this time.  No mass lesion is observed.  There is slight prominence of the temporal horns of the lateral ventricles.  IMPRESSION:  1.  Diffuse subarachnoid hemorrhage in the basilar cisterns, Sylvian fissures, along the tentorium and anterior falx, in the third and fourth ventricle, and right lateral ventricle posteriorly.  Given the history of prior aneurysm clipping, aneurysm rupture would be suspected. Neurosurgical consultation and / or cerebral angiography may be indicated. 2.  Mild dilatation of the temporal horns lateral ventricles, potentially an early sign of hydrocephalus. Critical Value/emergent results were called by telephone to Dr. Riki Altes (who verbally acknowledged these results) at the time of interpretation on 03/03/2012 at 6:40 p.m.  Original Report Authenticated By: Carron Curie, M.D.    Subarachnoid hemorrhage Hypertensive emergency   MDM  Hx of SAH.  Hypertensive today and has abrupt onset headache.  Sleepy and vomiting since then.  SBP of 300.  Treated with labetalol and nicardipene drip.  CT shows SAH.  Continued to attempt aggressive BP control with larger doses of labetalol, hydralazine and titrating up nicardipine but pt with perisstent hypertension.  D/w NSU - will take to OR for further management.  Persistently sleepy in ED but GCS remains at 15 and pt answering questions appropriately.        Dyke Brackett, MD 03/03/12 2022

## 2012-03-03 NOTE — Progress Notes (Signed)
BP 292/138  Pulse 63  Temp(Src) 97.4 F (36.3 C) (Oral)  Resp 16  Wt 71.2 kg (156 lb 15.5 oz)  SpO2 98% Ventricular catheter placed under sterile conditions, via Right frontal burr hole. Local anesthetic Catheter placed without difficulty, blood tinged fluid in catheter pulsatile.

## 2012-03-03 NOTE — Anesthesia Preprocedure Evaluation (Signed)
Anesthesia Evaluation  Patient identified by MRN, date of birth, ID band Patient awake    Reviewed: Allergy & Precautions, H&P , NPO status   History of Anesthesia Complications Negative for: history of anesthetic complications  Airway Mallampati: II TM Distance: >3 FB Neck ROM: Full    Dental No notable dental hx. (+) Teeth Intact and Dental Advisory Given   Pulmonary  breath sounds clear to auscultation  Pulmonary exam normal       Cardiovascular hypertension, Pt. on medications and Pt. 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    GI/Hepatic Neg liver ROS, GERD-  Controlled,  Endo/Other  negative endocrine ROS  Renal/GU negative Renal ROS     Musculoskeletal   Abdominal   Peds  Hematology   Anesthesia Other Findings   Reproductive/Obstetrics                           Anesthesia Physical Anesthesia Plan  ASA: IV and Emergent  Anesthesia Plan: General   Post-op Pain Management:    Induction: Intravenous, Rapid sequence and Cricoid pressure planned  Airway Management Planned: Oral ETT  Additional Equipment:   Intra-op Plan:   Post-operative Plan: Post-operative intubation/ventilation  Informed Consent: I have reviewed the patients History and Physical, chart, labs and discussed the procedure including the risks, benefits and alternatives for the proposed anesthesia with the patient or authorized representative who has indicated his/her understanding and acceptance.   Dental advisory given  Plan Discussed with: CRNA and Surgeon  Anesthesia Plan Comments: (Plan routine monitors, GETA with RSI)        Anesthesia Quick Evaluation

## 2012-03-03 NOTE — ED Notes (Addendum)
Neuro MD at bedside talking with family about POC.

## 2012-03-03 NOTE — ED Notes (Signed)
See triage note.  Pt able to move from one bed to the other without difficulty.  Pt A/O x 4.  Manual pressure being done at this time.

## 2012-03-03 NOTE — ED Notes (Signed)
pts daughter at bedside.  Pt sat up at a 45 per EDP order.  Pt continues to be on bedpan.  More lethargic.  EDP aware.

## 2012-03-03 NOTE — ED Notes (Signed)
RN with patient to CT scan.  Pt assisted to use the bedpan.  Had a large BM that was loose.  Pt assisted to use the bedpan again once back in room.  EDP notified of possible head bleed and BP not decreasing.  Pharmacy notified of need to nicardipine drip.

## 2012-03-03 NOTE — ED Provider Notes (Signed)
I saw and evaluated the patient, reviewed the resident's note and I agree with the findings and plan.  Last known well 1700 today, sudden onset frontal severe headache with nausea and vomiting twice, no change in speech or vision swallowing or understanding, no confusion, no localizing or lateralizing weakness numbness or incoordination, examination shows equal pupils, extraocular movements intact, peripheral visual fields full to confrontation, no facial asymmetry, , no pronator drift in her arms or legs, normal finger to nose testing bilaterally, normal light touch in all 4 extremities, GCS 15.  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  ECG: Sinus rhythm, ventricular rate 69, left axis deviation, left ventricular hypertrophy, lateral inverted T waves, compared to September 2011 first degree AV block no longer present  Maintains GCS 15 in ED.Pt stable in ED with no significant deterioration in condition.Patient / Family / Caregiver informed of clinical course, understand medical decision-making process, and agree with plan.  CRITICAL CARE Performed by: Babette Relic   Total critical care time: 13min  Critical care time was exclusive of separately billable procedures and treating other patients.  Critical care was necessary to treat or prevent imminent or life-threatening deterioration.  Critical care was time spent personally by me on the following activities: development of treatment plan with patient and/or surrogate as well as nursing, discussions with consultants, evaluation of patient's response to treatment, examination of patient, obtaining history from patient or surrogate, ordering and performing treatments and interventions, ordering and review of laboratory studies, ordering and review of radiographic studies, pulse oximetry and re-evaluation of patient's condition.  Babette Relic, MD 03/06/12 [phone]

## 2012-03-03 NOTE — Anesthesia Postprocedure Evaluation (Signed)
  Anesthesia Post-op Note  Patient: [name]  Procedure(s) Performed: * No procedures listed *  Patient Location: PACU and NICU  Anesthesia Type: General  Level of Consciousness: sedated  Airway and Oxygen Therapy: Patient remains intubated per anesthesia plan and Patient placed on Ventilator (see vital sign flow sheet for setting)  Post-op Pain: none  Post-op Assessment: Post-op Vital signs reviewed and Patient's Cardiovascular Status Stable  Post-op Vital Signs: Reviewed and stable  Complications: No apparent anesthesia complications

## 2012-03-03 NOTE — Transfer of Care (Signed)
Immediate Anesthesia Transfer of Care Note  Patient: [name]  Procedure(s) Performed: * No procedures listed *  Patient Location: PACU  Anesthesia Type: General  Level of Consciousness: sedated  Airway & Oxygen Therapy: Patient remains intubated per anesthesia plan and Patient placed on Ventilator (see vital sign flow sheet for setting)  Post-op Assessment: Report given to PACU RN and Post -op Vital signs reviewed and stable  Post vital signs: Reviewed and stable  Complications: No apparent anesthesia complications

## 2012-03-03 NOTE — ED Notes (Addendum)
Pt. Arrived to IR suite for cerebral angiogram.  Pt. Is intubated, has ventriculostomy drain, and is on a propofol gtt.  Patient is unresponsive.

## 2012-03-03 NOTE — Preoperative (Signed)
Beta Blockers   Reason not to administer Beta Blockers:Not Applicable 

## 2012-03-03 NOTE — ED Notes (Signed)
Anesthesia arrived and will be resuming care.

## 2012-03-03 NOTE — Consult Note (Signed)
Name: [name] MRN: [id number] DOB: February 06, 1961    LOS: 0  PULMONARY / CRITICAL CARE MEDICINE  The patient is encephalopathic and unable to provide history, which was obtained for available medical records.  HPI:  51 yo with history of HTN and previous intraparenchymal hemorrhage who presented to [facility] ED on 5/15 hypertensive with nausea, vomiting, headache and was found to have [place]. Intubated.  Ventriculostomy placed.    Past Medical History  Diagnosis Date  . HTN (hypertension)     resistant  . Hypercholesterolemia   . Tobacco abuse     resumed smoking half a pack a day. She was a smoker in the past and states she was abl eto stop in the past using nicotine patches  . CAD (coronary artery disease)     Pt has St elevation MI in Feb 2009. Left hear tcath at tha ttime showed a 70% distal LAD lesion that was hazy consistent with a plaque rupture. She had a 50% distal circumflex stenosis an a 95% distal RCA stenosis. She did havve drug eluting stent placed in her distal RCA  . Chronic kidney disease     most recent creatinine 1.4 in 3/10  . Intracranial hemorrhage, spontaneous intraparenchymal, idiopathic, remote, resolved   . Diastolic heart failure     pt most recent echo wasin Feb 2009 w an EF of 60% and severe left ventricular hypertrophy. The pt did have evidence of diastolic dysfunction. The RV appeared normal   History reviewed. No pertinent past surgical history. Prior to Admission medications   Medication Sig Start Date End Date Taking? Authorizing Provider  amLODipine (NORVASC) 10 MG tablet Take 1 tablet (10 mg total) by mouth daily. This is for blood pressure 06/13/11 06/12/12 Yes Liliane Shi, PA  aspirin EC 81 MG tablet Take 1 tablet (81 mg total) by mouth daily. This is for your heart 06/13/11  Yes Liliane Shi, PA  carvedilol (COREG) 12.5 MG tablet Take 1 tablet (12.5 mg total) by mouth 2 (two) times daily with a meal. 07/07/11 07/06/12 Yes Liliane Shi, PA  hydrALAZINE  (APRESOLINE) 50 MG tablet Take 1 tablet (50 mg total) by mouth 3 (three) times daily. This is for blood pressure 06/13/11 06/12/12 Yes Scott Joylene Draft, PA  nitroGLYCERIN (NITROSTAT) 0.4 MG SL tablet Place 0.4 mg under the tongue every 5 (five) minutes as needed. For chest pain   Yes Historical Provider, MD   Allergies Allergies  Allergen Reactions  . Ampicillin Other (See Comments)    unknown  . Penicillins Other (See Comments)    unknown   Family History Family History  Problem Relation Age of Onset  . Coronary artery disease Other     premature in 1st degree relatives   Social History  reports that she has been smoking.  She does not have any smokeless tobacco history on file. She reports that she does not drink alcohol or use illicit drugs.  Review Of Systems:  Unable to provide.  Brief patient description:  51 yo with history of HTN and previous intraparenchymal hemorrhage who presented to [facility] ED on 5/15 hypertensive with nasuea, vomiting, headache and was found to have [place]. Intubated.  Ventriculostomy placed.  Events Since Admission: 5/15  Presented to [facility] ED hypertensive with nasuea, vomiting, headache and was found to have [place] [employer]. Intubated.  Ventriculostomy placed.  Current Status:  Vital Signs: Temp:  [97.4 F (36.3 C)] 97.4 F (36.3 C) (05/15 1802) Pulse Rate:  [63-70] 63  (05/15  1925) Resp:  [16-31] 16  (05/15 1925) BP: (182-300)/(136-150) 292/138 mmHg (05/15 1922) SpO2:  [98 %-100 %] 98 % (05/15 1925) FiO2 (%):  [100 %] 100 % (05/15 2000) Weight:  [71.2 kg (156 lb 15.5 oz)] 71.2 kg (156 lb 15.5 oz) (05/15 2100)  Physical Examination: General:  Intubated, mechanically ventilated, no acute distress Neuro:  Sedated, synchronous, nonfocal, ventriculostomy in place HEENT:  PERRL, pink conjunctivae, moist membranes Neck:  Supple, no JVD   Cardiovascular:  RRR, no M/R/G Lungs:  Bilateral diminished air entry, no W/R/R Abdomen:  Soft, nontender, nondistended, bowel sounds  present Musculoskeletal:  Moves all extremities, no pedal edema Skin:  No rash  Active Problems:  Hypertensive emergency  SAH (subarachnoid hemorrhage)  Acute respiratory failure  HYPERLIPIDEMIA-MIXED  CAD, NATIVE VESSEL  DIASTOLIC HEART FAILURE, CHRONIC  CHRONIC KIDNEY DISEASE UNSPECIFIED  ASSESSMENT AND PLAN  PULMONARY No results found for this basename: PHART:5,PCO2:5,PCO2ART:5,PO2ART:5,HCO3:5,O2SAT:5 in the last 168 hours Ventilator Settings: Vent Mode:  [-] PRVC FiO2 (%):  [100 %] 100 % Set Rate:  [16 bmp] 16 bmp Vt Set:  [400 mL] 400 mL PEEP:  [5 cmH20] 5 cmH20  CXR:  Small left effusion, no airspace disease, ETT good position ETT:  5/15 >>>  A:  Acute respiratory failure. P:   - Full mechanical support - Goal pH > 7.30, goal SpO2 > 92% - ABG tonight - CXR in AM  CARDIOVASCULAR  Lab 03/03/12 1831  TROPONINI <0.30  LATICACIDVEN --  PROBNP --   ECG:  NA Lines: NA  A: Hypertensive emergency.  History of HTN, CAD, CHF (diastolic). P:  - Hold preadmission Rx - Cardene gtt - Goal SBP< 180 and DBP < 100 - Drug screen   RENAL  Lab 03/03/12 1830  NA 138  K 3.5  CL 106  CO2 18*  BUN 18  CREATININE 1.76*  CALCIUM 9.3  MG --  PHOS --   Intake/Output    None    Foley:  5/15 >>>  A:  Acute on chronic renal failure.  Hypokalemia. P:   - IVF NS 80 mL/h - KCl 40 mEq OG x 1 - BMP in AM  GASTROINTESTINAL No results found for this basename: AST:5,ALT:5,ALKPHOS:5,BILITOT:5,PROT:5,ALBUMIN:5 in the last 168 hours  A:  No active issues P:   - NGT  HEMATOLOGIC  Lab 03/03/12 1830  HGB 14.7  HCT 42.9  PLT 215  INR 0.98  APTT 30   A:  No active issues P:  -  No intervention required  INFECTIOUS  Lab 03/03/12 1830  WBC 7.1  PROCALCITON --   Cultures: NA Antibiotics: Ancef 5/15 >>>  A:  No active issues P:   -  No intervention required  ENDOCRINE  Lab 03/03/12 1919  GLUCAP 158*   A:  Hyperglycemia.   P:   - SSI /  CBG  NEUROLOGIC  Head CT:  Diffuse SAH with ventriculomegaly Angio: >>>  A:  SAH, nontraumatic s/p ventriculostomy P:   - Per Neurosurgery - [place]  BEST PRACTICE / DISPOSITION - Level of Care:  ICU - Primary Service:  Neurosurgery - Consultants:  PCCM - Code Status:  Full - Diet:  NPO - DVT Px:  SCDs - GI Px:  Protonix - Skin Integrity:  No issues - Social / Family:  Family is not available  The patient is critically ill with multiple organ systems failure and requires high complexity decision making for assessment and support, frequent evaluation and titration of therapies, application of  advanced monitoring technologies and extensive interpretation of multiple databases. Critical Care Time devoted to patient care services described in this note is 35 minutes.  Doree Fudge, M.D. Pulmonary and [place] Pager: [phone]  03/03/2012, 10:15 PM

## 2012-03-04 ENCOUNTER — Inpatient Hospital Stay (HOSPITAL_COMMUNITY): Payer: No Typology Code available for payment source

## 2012-03-04 ENCOUNTER — Encounter (HOSPITAL_COMMUNITY): Payer: Self-pay | Admitting: Radiology

## 2012-03-04 ENCOUNTER — Inpatient Hospital Stay (HOSPITAL_COMMUNITY): Payer: No Typology Code available for payment source | Admitting: Anesthesiology

## 2012-03-04 ENCOUNTER — Encounter (HOSPITAL_COMMUNITY): Payer: Self-pay | Admitting: Anesthesiology

## 2012-03-04 DIAGNOSIS — N189 Chronic kidney disease, unspecified: Secondary | ICD-10-CM

## 2012-03-04 DIAGNOSIS — I609 Nontraumatic subarachnoid hemorrhage, unspecified: Principal | ICD-10-CM

## 2012-03-04 DIAGNOSIS — I509 Heart failure, unspecified: Secondary | ICD-10-CM

## 2012-03-04 DIAGNOSIS — I2510 Atherosclerotic heart disease of native coronary artery without angina pectoris: Secondary | ICD-10-CM

## 2012-03-04 DIAGNOSIS — I5032 Chronic diastolic (congestive) heart failure: Secondary | ICD-10-CM

## 2012-03-04 DIAGNOSIS — J9600 Acute respiratory failure, unspecified whether with hypoxia or hypercapnia: Secondary | ICD-10-CM

## 2012-03-04 LAB — CBC
HCT: 36 % (ref 36.0–46.0)
MCHC: 33.6 g/dL (ref 30.0–36.0)
MCV: 85.5 fL (ref 78.0–100.0)
RDW: 14.8 % (ref 11.5–15.5)

## 2012-03-04 LAB — BASIC METABOLIC PANEL
BUN: 16 mg/dL (ref 6–23)
CO2: 19 mEq/L (ref 19–32)
Chloride: 112 mEq/L (ref 96–112)
Creatinine, Ser: 1.79 mg/dL — ABNORMAL HIGH (ref 0.50–1.10)
Glucose, Bld: 114 mg/dL — ABNORMAL HIGH (ref 70–99)

## 2012-03-04 LAB — GLUCOSE, CAPILLARY
Glucose-Capillary: 118 mg/dL — ABNORMAL HIGH (ref 70–99)
Glucose-Capillary: 109 mg/dL — ABNORMAL HIGH (ref 70–99)

## 2012-03-04 MED ORDER — CLONIDINE HCL 0.2 MG/24HR TD PTWK
0.2000 mg | MEDICATED_PATCH | TRANSDERMAL | Status: DC
  Administered 2012-03-04: 0.2 mg via TRANSDERMAL
  Filled 2012-03-04: qty 1

## 2012-03-04 MED ORDER — ACETAMINOPHEN 650 MG RE SUPP: 650.0000 mg | Freq: Four times a day (QID) | RECTAL | Status: DC | PRN

## 2012-03-04 MED ORDER — IOHEXOL 300 MG/ML  SOLN
400.0000 mL | Freq: Once | INTRAMUSCULAR | Status: AC | PRN
  Administered 2012-03-04: 175 mL via INTRA_ARTERIAL

## 2012-03-04 MED ORDER — ROCURONIUM BROMIDE 100 MG/10ML IV SOLN
INTRAVENOUS | Status: DC | PRN
  Administered 2012-03-04: 50 mg via INTRAVENOUS
  Administered 2012-03-04: 20 mg via INTRAVENOUS
  Administered 2012-03-04: 30 mg via INTRAVENOUS

## 2012-03-04 MED ORDER — FENTANYL CITRATE 0.05 MG/ML IJ SOLN
INTRAMUSCULAR | Status: DC | PRN
  Administered 2012-03-04: 250 ug via INTRAVENOUS
  Administered 2012-03-04: 150 ug via INTRAVENOUS
  Administered 2012-03-04: 100 ug via INTRAVENOUS

## 2012-03-04 MED ORDER — SODIUM CHLORIDE 0.9 % IV SOLN
INTRAVENOUS | Status: AC
  Administered 2012-03-04 – 2012-03-05 (×2): via INTRAVENOUS
  Administered 2012-03-06: 100 mL via INTRAVENOUS
  Administered 2012-03-08: 09:00:00 via INTRAVENOUS
  Administered 2012-03-09: 50 mL/h via INTRAVENOUS
  Administered 2012-03-10: 21:00:00 via INTRAVENOUS

## 2012-03-04 MED ORDER — ONDANSETRON HCL 4 MG/2ML IJ SOLN: 4.0000 mg | Freq: Four times a day (QID) | INTRAMUSCULAR | Status: DC | PRN

## 2012-03-04 MED ORDER — CLONIDINE HCL 0.2 MG PO TABS
0.2000 mg | ORAL_TABLET | Freq: Every day | ORAL | Status: DC
  Filled 2012-03-04: qty 1

## 2012-03-04 MED ORDER — HEPARIN SODIUM (PORCINE) 1000 UNIT/ML IJ SOLN
INTRAMUSCULAR | Status: DC | PRN
  Administered 2012-03-04 (×2): 1000 [IU] via INTRAVENOUS

## 2012-03-04 MED ORDER — CHLORHEXIDINE GLUCONATE 0.12 % MT SOLN
15.0000 mL | Freq: Two times a day (BID) | OROMUCOSAL | Status: DC
  Administered 2012-03-04: 15 mL via OROMUCOSAL

## 2012-03-04 MED ORDER — PHENYLEPHRINE HCL 10 MG/ML IJ SOLN
INTRAMUSCULAR | Status: DC | PRN
  Administered 2012-03-04: 80 ug via INTRAVENOUS

## 2012-03-04 MED ORDER — HYDROMORPHONE HCL PF 1 MG/ML IJ SOLN
0.5000 mg | INTRAMUSCULAR | Status: DC | PRN
  Administered 2012-03-06 – 2012-03-09 (×7): 1 mg via INTRAVENOUS
  Filled 2012-03-04 (×7): qty 1

## 2012-03-04 MED ORDER — ACETAMINOPHEN 500 MG PO TABS
1000.0000 mg | ORAL_TABLET | Freq: Four times a day (QID) | ORAL | Status: DC | PRN
  Administered 2012-03-05 – 2012-03-17 (×7): 1000 mg via ORAL
  Filled 2012-03-04: qty 2
  Filled 2012-03-04: qty 1
  Filled 2012-03-04 (×5): qty 2
  Filled 2012-03-04: qty 1
  Filled 2012-03-04: qty 2

## 2012-03-04 MED ORDER — LACTATED RINGERS IV SOLN
INTRAVENOUS | Status: DC | PRN
  Administered 2012-03-04: via INTRAVENOUS

## 2012-03-04 MED ORDER — NICARDIPINE HCL IN NACL 20-0.86 MG/200ML-% IV SOLN
5.0000 mg/h | INTRAVENOUS | Status: DC
  Administered 2012-03-04: 5 mg/h via INTRAVENOUS
  Administered 2012-03-04: 15 mg/h via INTRAVENOUS
  Administered 2012-03-04: 7.5 mg/h via INTRAVENOUS
  Filled 2012-03-04 (×3): qty 200

## 2012-03-04 MED ORDER — BIOTENE DRY MOUTH MT LIQD: 15.0000 mL | Freq: Two times a day (BID) | OROMUCOSAL | Status: DC

## 2012-03-04 MED ORDER — BIOTENE DRY MOUTH MT LIQD
15.0000 mL | Freq: Four times a day (QID) | OROMUCOSAL | Status: DC
Start: 2012-03-04 — End: 2012-03-05
  Administered 2012-03-04 (×2): 15 mL via OROMUCOSAL

## 2012-03-04 MED ORDER — EPTIFIBATIDE 2 MG/ML IV SOLN
INTRAVENOUS | Status: AC
  Filled 2012-03-04: qty 10

## 2012-03-04 MED ORDER — CHLORHEXIDINE GLUCONATE 0.12 % MT SOLN: 15.0000 mL | Freq: Two times a day (BID) | OROMUCOSAL | Status: DC

## 2012-03-04 NOTE — Progress Notes (Signed)
Nursing 1815 RN in patient's room assessing patient.  Patient had visitors at bedside.  Patient expressed that she felt nauseous, patient begin to vomit large amount of yellow thin liquids.  Patient stated that her head hurt, but felt better after vomiting.  RN assessed patient, patient stated name, that her "birthday is in May", that the current month is "May", but that the year was "2018", that she was in a "hospital" in "[facility]".  Pupils were 34mm and sluggish.  Patient is moving all extremities at this time.  IVC drain continues to drain serosanguinous drainage and ICP was transduced and is reading 6.  Patient now reports 7/10 headache and back of the neck pain.  Dr. Vertell Limber on call and paged, MD returned page and made aware of patient's neuro status, ICP and pain. MD suggested Zofran for nausea and gave order for Dilaudid IV for pain.  Patient given Zofran for nausea and now denies pain.

## 2012-03-04 NOTE — Clinical Documentation Improvement (Signed)
Hypertension Documentation Clarification Query  THIS DOCUMENT IS NOT A PERMANENT PART OF THE MEDICAL RECORD  Dear Dr. Oliver Pila Rolley Sims  In an effort to better capture your patient's severity of illness, reflect appropriate length of stay and utilization of resources, a review of the patient medical record has revealed the following indicators.   Based on your clinical judgment, please clarify and document in a progress note and/or discharge summary the clinical condition associated with the following supporting information: In responding to this query please exercise your independent judgment.  The fact that a query is asked, does not imply that any particular answer is desired or expected.   "Hypertensive emergency" is documented in the progress note/consulet note 5/15. Please consider the below (if your clinical findings/judgment agree) as you document the patient's diagnosis/condition(s) in the progress note and discharge summary. Thank you!  Possible Clinical Conditions?  - Malignant Hypertension  - Accelerated Hypertension  - Other condition (please document in the progress notes and/or discharge summary)  - Cannot Clinically determine at this time   Supporting Information:  - B/P on 5/15 = 294/136, 182/145, 300/150, 292/138, 216/104  - labetalol (NORMODYNE,TRANDATE) injection 80 mg IV once 5/15 - labetalol (NORMODYNE,TRANDATE) injection 40 mg IV once 5/15 - labetalol (NORMODYNE,TRANDATE) injection 20 mg IV once 5/15 - hydrALAZINE (APRESOLINE) injection 10 mg IV once 5/15 - cloNIDine (CATAPRES) tablet 0.2 mg oral dialy  - H&P "HTN (hypertension) resistant "  - H&P "SAH"    Reviewed:  no additional documentation provided  Thank You,  [place] Documentation Specialist: [phone] Pager  [place]

## 2012-03-04 NOTE — Progress Notes (Signed)
Subjective: Patient reports headache  Objective: Vital signs in last 24 hours: Temp:  [91.9 F (33.3 C)-98.4 F (36.9 C)] 98.3 F (36.8 C) (05/16 1200) Pulse Rate:  [60-86] 86  (05/16 1300) Resp:  [15-31] 20  (05/16 1300) BP: (0-300)/(0-150) 146/78 mmHg (05/16 1300) SpO2:  [95 %-100 %] 96 % (05/16 1300) Arterial Line BP: (3-188)/(-4-95) 174/78 mmHg (05/16 1300) FiO2 (%):  [59.7 %-100 %] 59.7 % (05/16 1015) Weight:  [71.2 kg (156 lb 15.5 oz)] 71.2 kg (156 lb 15.5 oz) (05/15 2100)  Intake/Output from previous day: 05/15 0701 - 05/16 0700 In: 1279.4 [I.V.:1279.4] Out: 1177 [Urine:1150; Drains:27] Intake/Output this shift: Total I/O In: 916.4 [I.V.:706.4; IV Piggyback:210] Out: 955 [Urine:875; Drains:80]  Neurologic: Mental status: alertness: lethargic, orientation: time, person Cranial nerves: II: pupils equal, round, reactive to light and accommodation, III,IV,VI: extraocular muscles extra-ocular motions intact, V: facial light touch sensation normal bilaterally, VII: upper facial muscle function normal bilaterally, VII: lower facial muscle function normal bilaterally, VIII: hearing normal, IX: soft palate elevation normal bilaterally, XII: tongue strength normal  Motor: moves all extremities.   Lab Results:  Basename 03/04/12 0500 03/03/12 1830  WBC 8.9 7.1  HGB 12.1 14.7  HCT 36.0 42.9  PLT 195 215   BMET  Basename 03/04/12 0500 03/03/12 1830  NA 141 138  K 3.5 3.5  CL 112 106  CO2 19 18*  GLUCOSE 114* 126*  BUN 16 18  CREATININE 1.79* 1.76*  CALCIUM 7.8* 9.3    Studies/Results: Ct Head Wo Contrast  03/04/2012  *RADIOLOGY REPORT*  Clinical Data: Diffuse subarachnoid hemorrhage.  CT HEAD WITHOUT CONTRAST  Technique:  Contiguous axial images were obtained from the base of the skull through the vertex without contrast.  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   Prior clips noted along the right MCA region.  Enhancement related to prior intravascular contrast partially obscures the known diffuse subarachnoid hemorrhage. Intraventricular hemorrhage noted.  Right frontal shunt noted extending into the frontal horns of the lateral ventricles, with tip along the left anterior thalamus.  No mass lesion or specific findings of acute CVA noted.  IMPRESSION:  1.  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 2.  Diffuse subarachnoid hemorrhage with continued intraventricular hemorrhage.  Subarachnoid hemorrhage is partially obscured by the enhancement related to recent angiography. 3.  Right frontal ventriculostomy catheter noted with tip along the left anterior thalamus.  Original Report Authenticated By: Carron Curie, M.D.   Ct Head Wo Contrast  03/03/2012  *RADIOLOGY REPORT*  Clinical Data: Sudden offset of the headache, nausea, and vomiting. Prominent hypertension.  History of aneurysm with intervention.  CT HEAD WITHOUT CONTRAST  Technique:  Contiguous axial images were obtained from the base of the skull through the vertex without contrast.  Comparison: 06/22/2010  Findings: Diffuse subarachnoid hemorrhage is observed in the sylvian fissures, suprasellar cistern, interpeduncular notch, ambient cistern, fourth ventricle, and probably posteriorly in the right lateral ventricle.  Images present within the third ventricle, and there is poor delineation of sulci in both hemispheres potentially from edema/swelling or low-level diffuse subarachnoid hemorrhage.  Small remote lacunar infarct in the left cerebellum appears stable. There is subarachnoid hemorrhage anteriorly along the falx. 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 Subarachnoid hemorrhage layers along the tentorium.  No loss of gray-white differentiation is observed to suggest acute stroke at this time.  No mass lesion  is observed.  There is slight prominence of the temporal horns of the  lateral ventricles.  IMPRESSION:  1.  Diffuse subarachnoid hemorrhage in the basilar cisterns, Sylvian fissures, along the tentorium and anterior falx, in the third and fourth ventricle, and right lateral ventricle posteriorly.  Given the history of prior aneurysm clipping, aneurysm rupture would be suspected. Neurosurgical consultation and / or cerebral angiography may be indicated. 2.  Mild dilatation of the temporal horns lateral ventricles, potentially an early sign of hydrocephalus. Critical Value/emergent results were called by telephone to Dr. Riki Altes (who verbally acknowledged these results) at the time of interpretation on 03/03/2012 at 6:40 p.m.  Original Report Authenticated By: Carron Curie, M.D.   Dg Chest Port 1 View  03/04/2012  *RADIOLOGY REPORT*  Clinical Data: Evaluate endotracheal tube position  PORTABLE CHEST - 1 VIEW  Comparison: Portable chest x-ray of 03/03/2012  Findings: The endotracheal tube tip remains low in position, approximately 1.8 cm above the carina.  The tube could be withdrawn another 1.5 cm. Opacity remains at the left lung base consistent with atelectasis, effusion, and possibly pneumonia.  The right lung is clear.  Cardiomegaly is stable.  An NG tube is present.  IMPRESSION:  1.  Tip of endotracheal tube 1.8 cm above the carina.  Consider withdrawing 1.5 cm. 2.  Persistent left basilar opacity.  Original Report Authenticated By: Joretta Bachelor, M.D.   Dg Chest Port 1 View  03/03/2012  *RADIOLOGY REPORT*  Clinical Data: Endotracheal tube placement  PORTABLE CHEST - 1 VIEW  Comparison: 05/28/2008  Findings: Endotracheal tube is placed low in the trachea with tip about 1.1 cm above the carina.  Mild cardiac enlargement with some prominence of pulmonary vascularity.  No significant edema. Blunting of the left costophrenic angle suggests a small effusion. Atelectasis in the left lung base.  No pneumothorax.  IMPRESSION: Endotracheal tube positioned with tip about  1.1 cm above the carina.  Cardiac enlargement with mild pulmonary vascular congestion.  No edema.  Small left pleural effusion with basilar atelectasis.  Original Report Authenticated By: Neale Burly, M.D.    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 Post coil CT shows ventriculomegaly. Ventricular catheter draining well. Extubation went smoothly, breathing without difficulty. She still has 3 untreated aneurysms which do need treatment. I will speak to Mrs. Pomplun about these aneurysms later.  LOS: 1 day     N[place] Guallpa L 03/04/2012, 1:34 PM
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
## 2012-03-04 NOTE — Progress Notes (Signed)
INITIAL ADULT NUTRITION ASSESSMENT Date: 03/04/2012   Time: 9:11 AM Reason for Assessment: Nutrition Risk  ASSESSMENT: Female 51 y.o.  Dx: SAH  Hx:  Past Medical History  Diagnosis Date  . HTN (hypertension)     resistant  . Hypercholesterolemia   . Tobacco abuse     resumed smoking half a pack a day. She was a smoker in the past and states she was abl eto stop in the past using nicotine patches  . CAD (coronary artery disease)     Pt has St elevation MI in Feb 2009. Left hear tcath at tha ttime showed a 70% distal LAD lesion that was hazy consistent with a plaque rupture. She had a 50% distal circumflex stenosis an a 95% distal RCA stenosis. She did havve drug eluting stent placed in her distal RCA  . Chronic kidney disease     most recent creatinine 1.4 in 3/10  . Intracranial hemorrhage, spontaneous intraparenchymal, idiopathic, remote, resolved   . Diastolic heart failure     pt most recent echo wasin Feb 2009 w an EF of 60% and severe left ventricular hypertrophy. The pt did have evidence of diastolic dysfunction. The RV appeared normal   Related Meds:     .  ceFAZolin (ANCEF) IV  1 g Intravenous Q8H  . chlorhexidine      . hydrALAZINE  10 mg Intravenous Once  . insulin aspart  0-15 Units Subcutaneous Q4H  . labetalol  20 mg Intravenous Once  . labetalol  40 mg Intravenous Once  . labetalol  80 mg Intravenous Once  . levetiracetam  500 mg Intravenous Q12H  .  morphine injection  4 mg Intravenous Once  . morphine      . niCARDipine  5 mg/hr Intravenous Once  . nitroGLYCERIN 1.5mg /50ml(25 mcg/ml) - MC-IR  1.5 mg Intra-arterial to XRAY  . ondansetron      . ondansetron (ZOFRAN) IV  4 mg Intravenous Once  . pantoprazole (PROTONIX) IV  40 mg Intravenous QHS  . potassium chloride  40 mEq Per Tube Once  . senna-docusate  1 tablet Oral BID  . DISCONTD: hydrALAZINE  5 mg Intravenous UD  . DISCONTD: niCARDipine  5 mg/hr Intravenous Once  . DISCONTD: propofol       Ht: 5'  1" (154.9 cm)  Wt: 156 lb 15.5 oz (71.2 kg)  Ideal Wt: 47.7 kg % Ideal Wt: 149%  Usual Wt:  Wt Readings from Last 10 Encounters:  03/03/12 156 lb 15.5 oz (71.2 kg)  07/07/11 157 lb (71.215 kg)  07/01/11 153 lb (69.4 kg)  06/20/11 152 lb (68.947 kg)  06/13/11 154 lb (69.854 kg)  04/18/11 154 lb (69.854 kg)  06/21/09 148 lb (67.132 kg)   % Usual Wt: 100%  Body mass index is 29.66 kg/(m^2). Overweight  Food/Nutrition Related Hx: Pt extubated this morning awaiting swallow evaluation. Pt awake but provides limited history. Pt nods yes to weight loss without trying but unable to determine any specifics at this time. No family present at this time.    Labs:  CMP     Component Value Date/Time   NA 141 03/04/2012 0500   K 3.5 03/04/2012 0500   CL 112 03/04/2012 0500   CO2 19 03/04/2012 0500   GLUCOSE 114* 03/04/2012 0500   BUN 16 03/04/2012 0500   CREATININE 1.79* 03/04/2012 0500   CALCIUM 7.8* 03/04/2012 0500   PROT 6.1 04/18/2011 1207   ALBUMIN 3.5 04/18/2011 1207   AST 12 04/18/2011  1207   ALT 13 04/18/2011 1207   ALKPHOS 74 04/18/2011 1207   BILITOT 0.5 04/18/2011 1207   GFRNONAA 32* 03/04/2012 0500   GFRAA 37* 03/04/2012 0500   CBG (last 3)   Basename 03/03/12 1919  GLUCAP 158*   No results found for this basename: HGBA1C    Intake/Output Summary (Last 24 hours) at 03/04/12 0916 Last data filed at 03/04/12 0900  Gross per 24 hour  Intake 1589.4 ml  Output   1303 ml  Net  286.4 ml    Diet Order: NPO  Supplements/Tube Feeding: none  IVF:    sodium chloride   sodium chloride Last Rate: 75 mL/hr at 03/04/12 0847  niCARDipine Last Rate: 5 mg/hr (03/04/12 0815)  propofol Last Rate: 25 mcg/kg/min (03/04/12 0846)  DISCONTD: fentaNYL infusion INTRAVENOUS   DISCONTD: niCARDipine   DISCONTD: niCARDipine   DISCONTD: propofol     Estimated Nutritional Needs:   Kcal: 1700-1900 Protein: 80-90 grams   Fluid: >1.7 L/day  NUTRITION DIAGNOSIS: -Inadequate oral intake  (NI-2.1).  Status: Ongoing  RELATED TO: inability to eat  AS EVIDENCE BY: NPO status  MONITORING/EVALUATION(Goals): Goal: Provide >/= 90% of her estimated needs Monitor: vent status, propofol, labs  EDUCATION NEEDS: -No education needs identified at this time  INTERVENTION:  Await swallow evaluation, supplement diet as appropriate.  Dietitian [id number]  DOCUMENTATION CODES Per approved criteria  -Not Applicable    Maylon Peppers [place] 03/04/2012, 9:11 AM

## 2012-03-04 NOTE — Progress Notes (Signed)
PT/OT Cancellation Note  Treatment cancelled today due to medical issues with patient which prohibited therapy.  Pt recently extubated within the hour and currently on bedrest.  Will need updated activity orders.  PT/OT will hold evaluation today.  [name] 03/04/2012, 11:26 AM [name], [place] DPT [phone]

## 2012-03-04 NOTE — Procedures (Signed)
Extubation Procedure Note  Patient Details:   Name: [name] DOB: 11-Sep-1961 MRN: [id number]  Evaluation  O2 sats: stable throughout Complications: No apparent complications Patient did tolerate procedure well. Bilateral Breath Sounds: Clear Suctioning: Airway Yes  Pt extubated per MD order.  Cuff leak positive prior to extubation.  Placed on 6l [place].  Pt tolerating well.  No complications, no stridor noted.  Will continue to monitor.  Phillis Knack [facility] 03/04/2012, 10:36 AM

## 2012-03-04 NOTE — Anesthesia Postprocedure Evaluation (Signed)
  Anesthesia Post-op Note  Patient: [name]  Procedure(s) Performed: * No procedures listed *  Patient Location: ICU  Anesthesia Type: General  Level of Consciousness: unresponsive, intubated and sedated  Airway and Oxygen Therapy: Patient remains intubated per anesthesia plan and Patient placed on Ventilator (see vital sign flow sheet for setting)  Post-op Pain: none  Post-op Assessment: Post-op Vital signs reviewed, Patient's Cardiovascular Status Stable, Respiratory Function Stable, No signs of Nausea or vomiting and Pain level controlled, remains intubated  Post-op Vital Signs: Reviewed and stable  Complications: No apparent anesthesia complications

## 2012-03-04 NOTE — Progress Notes (Signed)
7 Fr. Sheath removed at 1:00 and hemostasis achieved at 1:20 with a V Pad. 10 lb sandbag placed at site. No complications. Pressure dressing applied.

## 2012-03-04 NOTE — Anesthesia Preprocedure Evaluation (Signed)
Anesthesia Evaluation  Patient identified by MRN, date of birth, ID band Patient unresponsive    Reviewed: Allergy & Precautions, H&P , NPO status , Patient's Chart, lab work & pertinent test results, reviewed documented beta blocker date and time , Unable to perform ROS - Chart review only  History of Anesthesia Complications Negative for: history of anesthetic complications  Airway       Dental   Pulmonary Current Smoker,  Intubated 5/15 for Ventricular drain placement and angio following cerebral aneurysm rupture  breath sounds clear to auscultation  Pulmonary exam normal       Cardiovascular hypertension, Pt. on medications and Pt. 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     GI/Hepatic negative GI ROS, Neg liver ROS,   Endo/Other  negative endocrine ROS  Renal/GU Renal InsufficiencyRenal disease (creat 1.72)     Musculoskeletal   Abdominal (+) + obese,   Peds  Hematology   Anesthesia Other Findings Intubated earlier tonight   Reproductive/Obstetrics                           Anesthesia Physical Anesthesia Plan  ASA: IV and Emergent  Anesthesia Plan: General   Post-op Pain Management:    Induction: Inhalational  Airway Management Planned: Oral ETT  Additional Equipment: Arterial line  Intra-op Plan:   Post-operative Plan: Post-operative intubation/ventilation  Informed Consent:   Only emergency history available  Plan Discussed with: CRNA and Surgeon  Anesthesia Plan Comments: (Patient known to me from earlier intubation, plan routine monitors, A line, GETA via existing ETT.  Post operative intubation )        Anesthesia Quick Evaluation

## 2012-03-04 NOTE — Progress Notes (Signed)
Pt extubated at 1030 this morning. Tolerated well. Pt put on 6L nasal cannula. Pt able to state name. O2 at 98%.

## 2012-03-04 NOTE — Progress Notes (Signed)
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 Pt resting; intubated; unresponsive at this time RN says pt has followed few commands this am for her;B extr moving  Objective: Vital signs in last 24 hours: Temp:  [91.9 F (33.3 C)-97.7 F (36.5 C)] 97 F (36.1 C) (05/16 0900) Pulse Rate:  [60-83] 66  (05/16 0900) Resp:  [16-31] 17  (05/16 0900) BP: (0-300)/(0-150) 138/75 mmHg (05/16 0900) SpO2:  [96 %-100 %] 100 % (05/16 0900) Arterial Line BP: (3-188)/(-4-95) 170/82 mmHg (05/16 0900) FiO2 (%):  [59.7 %-100 %] 59.8 % (05/16 0900) Weight:  [156 lb 15.5 oz (71.2 kg)] 156 lb 15.5 oz (71.2 kg) (05/15 2100)    Intake/Output from previous day: 05/15 0701 - 05/16 0700 In: 1258 [I.V.:1258] Out: 1177 [Urine:1150; Drains:27] Intake/Output this shift: Total I/O In: 331.4 [I.V.:121.4; IV Piggyback:210] Out: 126 [Urine:100; Drains:26]  PE:  Afeb; vss Intubated Unresponsive; did try to open eye to me command Rt groin sheath intact; no bleeding; no hematoma 2+ pulse at rt foot 75 cc in Ventric; blood tinged Lab Results:   Basename 03/04/12 0500 03/03/12 1830  WBC 8.9 7.1  HGB 12.1 14.7  HCT 36.0 42.9  PLT 195 215   BMET  Basename 03/04/12 0500 03/03/12 1830  NA 141 138  K 3.5 3.5  CL 112 106  CO2 19 18*  GLUCOSE 114* 126*  BUN 16 18  CREATININE 1.79* 1.76*  CALCIUM 7.8* 9.3   PT/INR  Basename 03/03/12 1830  LABPROT 13.2  INR 0.98   ABG  Basename 03/03/12 2227  PHART 7.313*  HCO3 20.9    Studies/Results: Ct Head Wo Contrast  03/04/2012  *RADIOLOGY REPORT*  Clinical Data: Diffuse subarachnoid hemorrhage.  CT HEAD WITHOUT CONTRAST  Technique:  Contiguous axial images were obtained from the base of the skull through the vertex without contrast.  Comparison: 03/03/2012  Findings: Coils are noted in the basilar tip in the vicinity of the basilar tip aneurysm shown on cerebral  angiography.  Prior clips noted along the right MCA region.  Enhancement related to prior intravascular contrast partially obscures the known diffuse subarachnoid hemorrhage. Intraventricular hemorrhage noted.  Right frontal shunt noted extending into the frontal horns of the lateral ventricles, with tip along the left anterior thalamus.  No mass lesion or specific findings of acute CVA noted.  IMPRESSION:  1.  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 2.  Diffuse subarachnoid hemorrhage with continued intraventricular hemorrhage.  Subarachnoid hemorrhage is partially obscured by the enhancement related to recent angiography. 3.  Right frontal ventriculostomy catheter noted with tip along the left anterior thalamus.  Original Report Authenticated By: Carron Curie, M.D.   Ct Head Wo Contrast  03/03/2012  *RADIOLOGY REPORT*  Clinical Data: Sudden offset of the headache, nausea, and vomiting. Prominent hypertension.  History of aneurysm with intervention.  CT HEAD WITHOUT CONTRAST  Technique:  Contiguous axial images were obtained from the base of the skull through the vertex without contrast.  Comparison: 06/22/2010  Findings: Diffuse subarachnoid hemorrhage is observed in the sylvian fissures, suprasellar cistern, interpeduncular notch, ambient cistern, fourth ventricle, and probably posteriorly in the right lateral ventricle.  Images present within the third ventricle, and there is poor delineation of sulci in both hemispheres potentially from edema/swelling or low-level diffuse subarachnoid hemorrhage.  Small remote lacunar infarct in the left cerebellum appears stable. There is subarachnoid hemorrhage anteriorly along the falx. 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 Subarachnoid hemorrhage layers along the tentorium.  No loss of gray-white differentiation is observed to suggest acute stroke at this time.  No mass lesion is observed.  There is slight prominence of the temporal  horns of the lateral ventricles.  IMPRESSION:  1.  Diffuse subarachnoid hemorrhage in the basilar cisterns, Sylvian fissures, along the tentorium and anterior falx, in the third and fourth ventricle, and right lateral ventricle posteriorly.  Given the history of prior aneurysm clipping, aneurysm rupture would be suspected. Neurosurgical consultation and / or cerebral angiography may be indicated. 2.  Mild dilatation of the temporal horns lateral ventricles, potentially an early sign of hydrocephalus. Critical Value/emergent results were called by telephone to Dr. Riki Altes (who verbally acknowledged these results) at the time of interpretation on 03/03/2012 at 6:40 p.m.  Original Report Authenticated By: Carron Curie, M.D.   Dg Chest Port 1 View  03/04/2012  *RADIOLOGY REPORT*  Clinical Data: Evaluate endotracheal tube position  PORTABLE CHEST - 1 VIEW  Comparison: Portable chest x-ray of 03/03/2012  Findings: The endotracheal tube tip remains low in position, approximately 1.8 cm above the carina.  The tube could be withdrawn another 1.5 cm. Opacity remains at the left lung base consistent with atelectasis, effusion, and possibly pneumonia.  The right lung is clear.  Cardiomegaly is stable.  An NG tube is present.  IMPRESSION:  1.  Tip of endotracheal tube 1.8 cm above the carina.  Consider withdrawing 1.5 cm. 2.  Persistent left basilar opacity.  Original Report Authenticated By: Joretta Bachelor, M.D.   Dg Chest Port 1 View  03/03/2012  *RADIOLOGY REPORT*  Clinical Data: Endotracheal tube placement  PORTABLE CHEST - 1 VIEW  Comparison: 05/28/2008  Findings: Endotracheal tube is placed low in the trachea with tip about 1.1 cm above the carina.  Mild cardiac enlargement with some prominence of pulmonary vascularity.  No significant edema. Blunting of the left costophrenic angle suggests a small effusion. Atelectasis in the left lung base.  No pneumothorax.  IMPRESSION: Endotracheal tube positioned with  tip about 1.1 cm above the carina.  Cardiac enlargement with mild pulmonary vascular congestion.  No edema.  Small left pleural effusion with basilar atelectasis.  Original Report Authenticated By: Neale Burly, M.D.    Anti-infectives: Anti-infectives     Start     Dose/Rate Route Frequency Ordered Stop   03/03/12 2200   ceFAZolin (ANCEF) IVPB 1 g/50 mL premix        1 g 100 mL/hr over 30 Minutes Intravenous 3 times per day 03/03/12 2135            Assessment/Plan: s/p Basilar artery aneurysm coil  HA/ N/V HTN; SAH Arteriogram and basilar artery coiling early this am Intubated; no response to me But RN says she has had pt respond to commands to her; moves all extremities slowly Will report to Dr Estanislado Pandy Will follow  [name] A 03/04/2012

## 2012-03-04 NOTE — Consult Note (Signed)
Name: [name] MRN: [id number] DOB: [birth year]    LOS: 1  PULMONARY / CRITICAL CARE MEDICINE  The patient is encephalopathic and unable to provide history, which was obtained for available medical records.  HPI:  51 yo with history of HTN and previous intraparenchymal hemorrhage who presented to [facility] ED on 5/15 hypertensive with nausea, vomiting, headache and was found to have [place]. Intubated.  Ventriculostomy placed.   Brief patient description:  51 yo with history of HTN and previous intraparenchymal hemorrhage who presented to [facility] ED on 5/15 hypertensive with nasuea, vomiting, headache and was found to have [place]. Intubated.  Ventriculostomy placed.  Events Since Admission: 5/15  Presented to [facility] ED hypertensive with nasuea, vomiting, headache and was found to have [place]. Intubated.  Ventriculostomy placed. 5/16- remained on vent  Vital Signs: Temp:  [91.9 F (33.3 C)-97.9 F (36.6 C)] 97.9 F (36.6 C) (05/16 0945) Pulse Rate:  [60-83] 69  (05/16 0945) Resp:  [15-31] 22  (05/16 0945) BP: (0-300)/(0-150) 137/75 mmHg (05/16 0930) SpO2:  [96 %-100 %] 99 % (05/16 0945) Arterial Line BP: (3-188)/(-4-95) 157/77 mmHg (05/16 0945) FiO2 (%):  [59.7 %-100 %] 59.7 % (05/16 0945) Weight:  [71.2 kg (156 lb 15.5 oz)] 71.2 kg (156 lb 15.5 oz) (05/15 2100)  Physical Examination: General:  Intubated, mechanically ventilated, no acute distress Neuro:  Sedated, synchronous, nonfocal, ventriculostomy in place HEENT:  PErrl Neck: no JVD   Cardiovascular:  RRR, no M/R/G Lungs:  reduced Abdomen:  Soft, nontender, nondistended, bowel sounds present Musculoskeletal:  Moves all extremities, no pedal edema Skin:  No rash  Active Problems:  HYPERLIPIDEMIA-MIXED  CAD, NATIVE VESSEL  DIASTOLIC HEART FAILURE, CHRONIC  CHRONIC KIDNEY DISEASE UNSPECIFIED  Hypertensive emergency  SAH (subarachnoid hemorrhage)  Acute respiratory failure  ASSESSMENT AND PLAN  PULMONARY  Lab 03/03/12 2227  PHART 7.313*   PCO2ART 42.3  PO2ART 348.0*  HCO3 20.9  O2SAT 99.8   Ventilator Settings: Vent Mode:  [-] PRVC FiO2 (%):  [59.7 %-100 %] 59.7 % Set Rate:  [16 bmp] 16 bmp Vt Set:  [400 mL] 400 mL PEEP:  [5 cmH20] 5 cmH20 Plateau Pressure:  [16 cmH20-17 cmH20] 17 cmH20  CXR:  Small left effusion, no airspace disease, ETT good position, imprved aeration/atx ETT:  5/15 >>>  A:  Acute respiratory failure. P:   - ABg reviewed -assess neurostatus -wean cpap 5 p5 goal 30 min , assess rsbi, airway protection  CARDIOVASCULAR  Lab 03/03/12 1831  TROPONINI <0.30  LATICACIDVEN --  PROBNP --   ECG:  NA Lines: NA  A: Hypertensive emergency.  History of HTN, CAD, CHF (diastolic). P:  - Hold preadmission Rx - Cardene gtt, will need oral load meds - Goal SBP< 180 and DBP < 100 = 25% reduction from highest MAp on admission - Drug screen pending -add home clonidine to avoid rebound contribution then in am coreg etc pending nicardipine needs which are high now -avoid any tachy with diastolic disease Avoid any NTG with ICP, all though drain now in place  RENAL  Lab 03/04/12 0500 03/03/12 1830  NA 141 138  K 3.5 3.5  CL 112 106  CO2 19 18*  BUN 16 18  CREATININE 1.79* 1.76*  CALCIUM 7.8* 9.3  MG -- --  PHOS -- --   Intake/Output      05/15 0701 - 05/16 0700 05/16 0701 - 05/17 0700   I.V. (mL/kg) 1279.4 (18) 321.4 (4.5)   IV Piggyback  210  Total Intake(mL/kg) 1279.4 (18) 531.4 (7.5)   Urine (mL/kg/hr) 1150 (0.7) 525   Drains 27 26   Total Output 1177 551   Net +102.4 -19.6         Foley:  5/15 >>>  A:  Acute on chronic renal failure.  Hypokalemia. P:   - IVF NS 80 mL/h - ensure renal US on file to assess polycystic renal dz association  GASTROINTESTINAL No results found for this basename: AST:5,ALT:5,ALKPHOS:5,BILITOT:5,PROT:5,ALBUMIN:5 in the last 168 hours  A:  No active issues P:   - NGT -if not extubated will feed likely ppi  HEMATOLOGIC  Lab 03/04/12 0500  03/03/12 1830  HGB 12.1 14.7  HCT 36.0 42.9  PLT 195 215  INR -- 0.98  APTT -- 30   A:  No active issues P:  -  No intervention required -scd  INFECTIOUS  Lab 03/04/12 0500 03/03/12 1830  WBC 8.9 7.1  PROCALCITON -- --   Cultures: NA Antibiotics: Ancef 5/15 >>>  A:  No active issues P:   -  No intervention required -consider dc ancef  ENDOCRINE  Lab 03/03/12 1919  GLUCAP 158*   A:  Hyperglycemia.   P:   - SSI / CBG  NEUROLOGIC  Head CT:  Diffuse SAH with ventriculomegaly Angio: >>>coiled, 3 remain  A:  SAH, nontraumatic s/p ventriculostomy P:   - drain  Per Neurosurgery - Keppra -tolerating propofol well, WUA  -plenty of BP for CPP goals   BEST PRACTICE / DISPOSITION - Level of Care:  ICU - Primary Service:  Neurosurgery - Consultants:  PCCM - Code Status:  Full - Diet:  NPO - DVT Px:  SCDs - GI Px:  Protonix - Skin Integrity:  No issues - Social / Family:  Family is not available  The patient is critically ill with multiple organ systems failure and requires high complexity decision making for assessment and support, frequent evaluation and titration of therapies, application of advanced monitoring technologies and extensive interpretation of multiple databases. Critical Care Time devoted to patient care services described in this note is 35 minutes.  Raylene Miyamoto., M.D. Pulmonary and [place] Pager: [phone]  03/04/2012, 10:03 AM
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
## 2012-03-04 NOTE — Plan of Care (Signed)
Problem: Consults Goal: Ventilated Patients Patient Education See Patient Education Module for education specifics.  Outcome: Not Applicable Date Met:  123456 Pt extubated     Problem: Phase I Progression Outcomes Goal: Sedation Protocol initiated if indicated Outcome: Not Applicable Date Met:  123456 Pt extubated    Goal: Tracheostomy by Vent Day 14 Outcome: Not Applicable Date Met:  123456 Pt extubated  Problem: Phase II Progression Outcomes Goal: Date pt extubated/weaned off vent Outcome: Completed/Met Date Met:  03/04/12 03/04/2012 Goal: Time pt extubated/weaned off vent Outcome: Completed/Met Date Met:  03/04/12 1030  Problem: Phase II Progression Outcomes Goal: Tolerating diet/TF at goal rate Outcome: Not Applicable Date Met:  123456 No diet ordered at this time   Comments:  Pt extubated at 1030 on 03/04/2012.

## 2012-03-04 NOTE — Progress Notes (Signed)
03/04/2012 Darrol Jump OTR/L Pager [phone] Office [phone]

## 2012-03-04 NOTE — Progress Notes (Signed)
SLP Cancellation/Discharge Note  Treatment cancelled today due to medical issues with patient which prohibited therapy.  Patient currently intubated and ventilated.  Will cancel swallow evaluation order.  *Please reorder SLP services when patient appropriate (i.e., off vent).  Wyline Copas, M.S.,CCC-SLP Pager [phone] 03/04/2012, 8:10 AM

## 2012-03-04 NOTE — Transfer of Care (Signed)
Immediate Anesthesia Transfer of Care Note  Patient: [name]  Procedure(s) Performed: * No procedures listed *  Patient Location: NICU  Anesthesia Type: General  Level of Consciousness: sedated and unresponsive  Airway & Oxygen Therapy: Patient remains intubated per anesthesia plan  Post-op Assessment: Report given to PACU RN and Post -op Vital signs reviewed and stable  Post vital signs: Reviewed and stable  Complications: No apparent anesthesia complications

## 2012-03-04 NOTE — Progress Notes (Signed)
SLP Cancellation Note  Treatment cancelled today due to patient extubated within the last hour, has a hoarse vocal quality and is s/p SAH.  Will f/u on 5/17 (24 hours post-extubation) for a bedside swallow assessment.  Wyline Copas, M.S.,CCC-SLP Pager [phone] 03/04/2012, 11:03 AM

## 2012-03-04 NOTE — Progress Notes (Signed)
Nursing 0000000 Patient's systolic blood pressure goal is 120-140 per Dr. Arlean Hopping Cardene order. Cardene started at 0815 for SBP out of ordered range. Cardene titrated up to 15mg  per order and SBP continues to be higher than goal. Jannifer Franklin, PA made aware of drip at max and current BP. Continue drip at current rate, no new orders.

## 2012-03-04 NOTE — Procedures (Signed)
S/P 4 vessel cerebral arteriogram RT CFA approach. GA  Findings. 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 3.app3.103mm x 20mm lt ICA terminus aneurysm. 4.app 2.24mm x 2.36mm Lt ICA Pcom region aneurysm. S/P complete endovascular  obliteration of basilar apex aneurysm

## 2012-03-05 ENCOUNTER — Inpatient Hospital Stay (HOSPITAL_COMMUNITY): Payer: No Typology Code available for payment source

## 2012-03-05 LAB — BLOOD GAS, ARTERIAL
Acid-base deficit: 4.9 mmol/L — ABNORMAL HIGH (ref 0.0–2.0)
Drawn by: 31288
FIO2: 1 %
O2 Saturation: 96.3 %
Patient temperature: 98.6

## 2012-03-05 LAB — BASIC METABOLIC PANEL
BUN: 11 mg/dL (ref 6–23)
CO2: 21 mEq/L (ref 19–32)
Calcium: 8.5 mg/dL (ref 8.4–10.5)
Chloride: 110 mEq/L (ref 96–112)
Creatinine, Ser: 1.89 mg/dL — ABNORMAL HIGH (ref 0.50–1.10)
GFR calc Af Amer: 36 mL/min — ABNORMAL LOW (ref >90)
GFR calc non Af Amer: 30 mL/min — ABNORMAL LOW (ref >90)
Glucose, Bld: 97 mg/dL (ref 70–99)
Potassium: 3.9 mEq/L (ref 3.5–5.1)
Sodium: 143 mEq/L (ref 135–145)

## 2012-03-05 LAB — DIFFERENTIAL
Basophils Absolute: 0 10*3/uL (ref 0.0–0.1)
Basophils Absolute: 0 10*3/uL (ref 0.0–0.1)
Basophils Relative: 0 % (ref 0–1)
Basophils Relative: 0 % (ref 0–1)
Eosinophils Absolute: 0 10*3/uL (ref 0.0–0.7)
Eosinophils Relative: 0 % (ref 0–5)
Monocytes Relative: 4 % (ref 3–12)
Neutro Abs: 8.7 10*3/uL — ABNORMAL HIGH (ref 1.7–7.7)
Neutrophils Relative %: 84 % — ABNORMAL HIGH (ref 43–77)
Neutrophils Relative %: 80 % — ABNORMAL HIGH (ref 43–77)

## 2012-03-05 LAB — GLUCOSE, CAPILLARY
Glucose-Capillary: 99 mg/dL (ref 70–99)
Glucose-Capillary: 92 mg/dL (ref 70–99)
Glucose-Capillary: 110 mg/dL — ABNORMAL HIGH (ref 70–99)

## 2012-03-05 LAB — CBC
Hemoglobin: 12.9 g/dL (ref 12.0–15.0)
MCH: 28.8 pg (ref 26.0–34.0)
MCHC: 33.6 g/dL (ref 30.0–36.0)
MCV: 87.9 fL (ref 78.0–100.0)
Platelets: 182 10*3/uL (ref 150–400)
RDW: 15.4 % (ref 11.5–15.5)

## 2012-03-05 LAB — RAPID URINE DRUG SCREEN, HOSP PERFORMED
Amphetamines: NOT DETECTED
Benzodiazepines: NOT DETECTED
Cocaine: NOT DETECTED
Opiates: POSITIVE — AB

## 2012-03-05 LAB — MAGNESIUM: Magnesium: 1.7 mg/dL (ref 1.5–2.5)

## 2012-03-05 LAB — PHOSPHORUS: Phosphorus: 4.4 mg/dL (ref 2.3–4.6)

## 2012-03-05 MED ORDER — CEFAZOLIN SODIUM 1-5 GM-% IV SOLN
1.0000 g | Freq: Two times a day (BID) | INTRAVENOUS | Status: DC
  Administered 2012-03-05 – 2012-03-15 (×20): 1 g via INTRAVENOUS
  Filled 2012-03-05 (×22): qty 50

## 2012-03-05 MED ORDER — NIMODIPINE 30 MG PO CAPS
60.0000 mg | ORAL_CAPSULE | ORAL | Status: DC
  Administered 2012-03-05 – 2012-03-14 (×55): 60 mg via ORAL
  Filled 2012-03-05 (×61): qty 2

## 2012-03-05 MED ORDER — AMLODIPINE BESYLATE 10 MG PO TABS
10.0000 mg | ORAL_TABLET | Freq: Every day | ORAL | Status: DC
  Filled 2012-03-05: qty 1

## 2012-03-05 MED ORDER — ALBUTEROL SULFATE (5 MG/ML) 0.5% IN NEBU
2.5000 mg | INHALATION_SOLUTION | Freq: Four times a day (QID) | RESPIRATORY_TRACT | Status: DC
  Administered 2012-03-05 – 2012-03-08 (×9): 2.5 mg via RESPIRATORY_TRACT
  Filled 2012-03-05 (×11): qty 0.5

## 2012-03-05 MED ORDER — MAGNESIUM SULFATE 40 MG/ML IJ SOLN
2.0000 g | Freq: Once | INTRAMUSCULAR | Status: AC
  Administered 2012-03-05: 2 g via INTRAVENOUS
  Filled 2012-03-05: qty 50

## 2012-03-05 MED ORDER — NICARDIPINE HCL IN NACL 20-0.86 MG/200ML-% IV SOLN
5.0000 mg/h | INTRAVENOUS | Status: DC
  Administered 2012-03-05 (×2): 5 mg/h via INTRAVENOUS
  Administered 2012-03-05: 2.5 mg/h via INTRAVENOUS
  Filled 2012-03-05 (×5): qty 200

## 2012-03-05 MED ORDER — NICOTINE 21 MG/24HR TD PT24
21.0000 mg | MEDICATED_PATCH | TRANSDERMAL | Status: DC
  Administered 2012-03-05 – 2012-03-13 (×8): 21 mg via TRANSDERMAL
  Filled 2012-03-05 (×13): qty 1

## 2012-03-05 MED ORDER — POTASSIUM CHLORIDE 10 MEQ/100ML IV SOLN
10.0000 meq | INTRAVENOUS | Status: AC
  Administered 2012-03-05 (×2): 10 meq via INTRAVENOUS
  Filled 2012-03-05 (×2): qty 100

## 2012-03-05 MED ORDER — NIMODIPINE 30 MG PO CAPS
30.0000 mg | ORAL_CAPSULE | ORAL | Status: DC
  Filled 2012-03-05 (×6): qty 1

## 2012-03-05 MED ORDER — MAGNESIUM SULFATE 50 % IJ SOLN: 2.0000 g | Freq: Once | INTRAMUSCULAR | Status: DC

## 2012-03-05 MED ORDER — LABETALOL HCL 5 MG/ML IV SOLN
10.0000 mg | INTRAVENOUS | Status: DC | PRN
  Administered 2012-03-05 – 2012-03-08 (×6): 10 mg via INTRAVENOUS
  Filled 2012-03-05 (×6): qty 4

## 2012-03-05 MED ORDER — HYDRALAZINE HCL 50 MG PO TABS
50.0000 mg | ORAL_TABLET | Freq: Three times a day (TID) | ORAL | Status: DC
  Administered 2012-03-05 – 2012-03-18 (×40): 50 mg via ORAL
  Filled 2012-03-05 (×43): qty 1

## 2012-03-05 MED ORDER — CLONIDINE HCL 0.2 MG PO TABS
0.2000 mg | ORAL_TABLET | Freq: Every day | ORAL | Status: DC
  Administered 2012-03-05: 0.2 mg via ORAL
  Filled 2012-03-05: qty 1

## 2012-03-05 MED ORDER — POTASSIUM CHLORIDE 20 MEQ/15ML (10%) PO LIQD
40.0000 meq | Freq: Once | ORAL | Status: DC
  Filled 2012-03-05: qty 30

## 2012-03-05 MED ORDER — POTASSIUM CHLORIDE 20 MEQ/15ML (10%) PO LIQD
ORAL | Status: AC
  Filled 2012-03-05: qty 30

## 2012-03-05 MED ORDER — PNEUMOCOCCAL VAC POLYVALENT 25 MCG/0.5ML IJ INJ
0.5000 mL | INJECTION | INTRAMUSCULAR | Status: AC
  Administered 2012-03-05: 0.5 mL via INTRAMUSCULAR
  Filled 2012-03-05: qty 0.5

## 2012-03-05 MED ORDER — PANTOPRAZOLE SODIUM 40 MG PO TBEC
40.0000 mg | DELAYED_RELEASE_TABLET | Freq: Every day | ORAL | Status: DC
  Administered 2012-03-05 – 2012-03-25 (×20): 40 mg via ORAL
  Filled 2012-03-05 (×21): qty 1

## 2012-03-05 MED ORDER — CARVEDILOL 12.5 MG PO TABS
12.5000 mg | ORAL_TABLET | Freq: Two times a day (BID) | ORAL | Status: DC
  Administered 2012-03-05 – 2012-03-07 (×5): 12.5 mg via ORAL
  Filled 2012-03-05 (×7): qty 1

## 2012-03-05 MED ORDER — CLONIDINE HCL 0.2 MG PO TABS
0.2000 mg | ORAL_TABLET | Freq: Two times a day (BID) | ORAL | Status: DC
  Administered 2012-03-05 – 2012-03-06 (×3): 0.2 mg via ORAL
  Filled 2012-03-05 (×6): qty 1

## 2012-03-05 NOTE — Progress Notes (Signed)
Patient ID: [name], female   DOB: 29-Dec-1960, 51 y.o.   MRN: [id number] BP 125/64  Pulse 70  Temp(Src) 98 F (36.7 C) (Axillary)  Resp 23  Ht 5\' 1"  (1.549 m)  Wt 71.2 kg (156 lb 15.5 oz)  BMI 29.66 kg/m2  SpO2 98% Mild lethargy, arouses quickly to voice. Following all commands. Oriented to person, place, year.  Perrl, full eom Symmetric facies, tongue and uvula midline. Moving all extremities. Ventricular catheter draining well, csf blood tinged Doing well overall. Continue with oral meds for bp, much better after taking them. Passed swallowing screen. Regular diet, with thin liquids. Continue ventric at 10cm H20

## 2012-03-05 NOTE — Progress Notes (Signed)
Pt taken off BiPAP per Dr. Joya Gaskins. Pt did not tolerate the mask. ABG drawn with values WNL.

## 2012-03-05 NOTE — Progress Notes (Signed)
See attached note. Evaluation deferred and will need re-order when appropriate.  03/05/2012 Barry Brunner, PT Pager: [phone]

## 2012-03-05 NOTE — Consult Note (Signed)
Name: [name] MRN: [id number] DOB: 04-29-1961    LOS: 2  PULMONARY / CRITICAL CARE MEDICINE  The patient is encephalopathic and unable to provide history, which was obtained for available medical records.  HPI:  51 yo with history of HTN and previous intraparenchymal hemorrhage who presented to [facility] ED on 5/15 hypertensive with nausea, vomiting, headache and was found to have [place]. Intubated.  Ventriculostomy placed.   Brief patient description:  51 yo with history of HTN and previous intraparenchymal hemorrhage who presented to [facility] ED on 5/15 hypertensive with nasuea, vomiting, headache and was found to have [place]. Intubated.  Ventriculostomy placed.  Events Since Admission: 5/15  Presented to [facility] ED hypertensive with nasuea, vomiting, headache and was found to have [place]. Intubated.  Ventriculostomy placed. 5/16- extubated successfully  Vital Signs: Temp:  [98.1 F (36.7 C)-98.9 F (37.2 C)] 98.4 F (36.9 C) (05/17 0722) Pulse Rate:  [70-93] 71  (05/17 0900) Resp:  [14-26] 19  (05/17 0900) BP: (129-208)/(66-135) 186/102 mmHg (05/17 0900) SpO2:  [92 %-100 %] 99 % (05/17 0900) Arterial Line BP: (156-178)/(75-82) 174/78 mmHg (05/16 1300) FiO2 (%):  [59.7 %] 59.7 % (05/16 1015)  Physical Examination: General:  Extubated, no distress Neuro:  Awake, follows commands HEENT:  PErrl Neck: no JVD   Cardiovascular:  RRR, no M/R/G Lungs:  Reduced but clear Abdomen:  Soft, nontender, nondistended, bowel sounds present Musculoskeletal:  Moves all extremities, no pedal edema Skin:  No rash  Principal Problem:  *SAH (subarachnoid hemorrhage) Active Problems:  HYPERLIPIDEMIA-MIXED  CAD, NATIVE VESSEL  DIASTOLIC HEART FAILURE, CHRONIC  CHRONIC KIDNEY DISEASE UNSPECIFIED  Hypertensive emergency  Acute respiratory failure  ASSESSMENT AND PLAN  PULMONARY  Lab 03/03/12 2227  PHART 7.313*  PCO2ART 42.3  PO2ART 348.0*  HCO3 20.9  O2SAT 99.8   Ventilator Settings: Vent Mode:  [-]    FiO2 (%):  [59.7 %] 59.7 %  CXR:  Small lung volumes ETT:  5/15 >>>5/16  A:  Acute respiratory failure. P:   - extubated, no distress -pcxr overcalling int prominence, continue to pos balance and nearing [place] needs -pcxr follow up in am   CARDIOVASCULAR  Lab 03/03/12 1831  TROPONINI <0.30  LATICACIDVEN --  PROBNP --   ECG:  NA Lines: NA  A: Hypertensive emergency.  History of HTN, CAD, CHF (diastolic). SVt this am  P:  - Cardene gtt off,  oral load meds, clonidine, hydralazine, coreg, norvasc - Goal SBP< 180 and DBP < 100 = 25% reduction from highest MAp on admission -add home clonidine to avoid rebound contribution then in am coreg etc pending nicardipine needs which are high now -avoid any tachy with diastolic disease -check mg, phos, tele, lytes in am, this svt spont resolved   RENAL  Lab 03/05/12 0645 03/05/12 0051 03/04/12 0500 03/03/12 1830  NA 143 142 141 138  K 3.8 3.9 -- --  CL 112 110 112 106  CO2 21 19 19  18*  BUN 12 11 16 18   CREATININE 1.89* 1.83* 1.79* 1.76*  CALCIUM 8.5 8.7 7.8* 9.3  MG -- -- -- --  PHOS -- -- -- --   Intake/Output      05/16 0701 - 05/17 0700 05/17 0701 - 05/18 0700   I.V. (mL/kg) 1981.4 (27.8) 150 (2.1)   IV Piggyback 262 105   Total Intake(mL/kg) 2243.4 (31.5) 255 (3.6)   Urine (mL/kg/hr) 3770 (2.2) 150   Drains 308 15   Total Output 4078 165   Net -1834.6 +  90        Emesis Occurrence 1 x     Foley:  5/15 >>>  A:  Acute on chronic renal failure.  Hypokalemia mild but svt run P:   - reaplce K with svt, assess mg ,phos -avoid neg balance, increase volume slight  GASTROINTESTINAL No results found for this basename: AST:5,ALT:5,ALKPHOS:5,BILITOT:5,PROT:5,ALBUMIN:5 in the last 168 hours  A:  No active issues P:   - swallow test passed, start oral HTN meds Diet ppi  HEMATOLOGIC  Lab 03/05/12 0645 03/05/12 0051 03/04/12 0500 03/03/12 1830  HGB 12.1 12.9 12.1 14.7  HCT 36.9 38.4 36.0 42.9  PLT 182 215 195 215  INR  -- -- -- 0.98  APTT -- -- -- 30   A:  No active issues P:  -scd  INFECTIOUS  Lab 03/05/12 0645 03/05/12 0051 03/04/12 0500 03/03/12 1830  WBC 8.5 10.5 8.9 7.1  PROCALCITON -- -- -- --   Cultures: NA Antibiotics: Ancef 5/15 >>>per NS  A:  No active issues P:   -consider dc ancef  ENDOCRINE  Lab 03/05/12 0720 03/05/12 0331 03/04/12 2357 03/04/12 2003 03/04/12 1630  GLUCAP 83 92 88 99 99   A:  Hyperglycemia.   P:   - SSI / CBG  NEUROLOGIC  Head CT:  Diffuse SAH with ventriculomegaly Angio: >>>coiled, 3 remain  A:  SAH, nontraumatic s/p ventriculostomy P:   - drain  Per Neurosurgery - Keppra -CPP goals met -consider substitute nimodipine for Norvasc -keep pos for now, risk vasospasm at day 3 coil rises -consider tcd in 2 days  BEST PRACTICE / DISPOSITION - Level of Care:  ICU - Primary Service:  Neurosurgery - Consultants:  PCCM - Code Status:  Full - Diet:  NPO - DVT Px:  SCDs - GI Px:  Protonix - Skin Integrity:  No issues - Social / Family:  Family is not available  Raylene Miyamoto., M.D. Pulmonary and [place] Pager: [phone]  03/05/2012, 9:45 AM

## 2012-03-05 NOTE — Progress Notes (Signed)
03/05/2012 19:10  Nasal cannula was at 5LPM for decreased Sp02 88+. Pt initially responded with sats as high as 94%, then would not rise above 91%. RN placed Venturi mask on pt starting at 35% and increased to as high as 50%. Dr. Joya Gaskins aware, orders received. Pt placed on nonrebreather 100% per MD. Pt neuro status unchanged. Respiratory notified of orders, and pt placed on Bipap per order. Report given to night shift RN.  [name]

## 2012-03-05 NOTE — Evaluation (Signed)
Clinical/Bedside Swallow Evaluation Patient Details  Name: [name] MRN: [id number] Date of Birth: 1961-03-24  Today's Date: 03/05/2012 Time: [id number] SLP Time Calculation (min): 32 min  Past Medical History:  Past Medical History  Diagnosis Date  . HTN (hypertension)     resistant  . Hypercholesterolemia   . Tobacco abuse     resumed smoking half a pack a day. She was a smoker in the past and states she was abl eto stop in the past using nicotine patches  . CAD (coronary artery disease)     Pt has St elevation MI in Feb 2009. Left hear tcath at tha ttime showed a 70% distal LAD lesion that was hazy consistent with a plaque rupture. She had a 50% distal circumflex stenosis an a 95% distal RCA stenosis. She did havve drug eluting stent placed in her distal RCA  . Chronic kidney disease     most recent creatinine 1.4 in 3/10  . Intracranial hemorrhage, spontaneous intraparenchymal, idiopathic, remote, resolved   . Diastolic heart failure     pt most recent echo wasin Feb 2009 w an EF of 60% and severe left ventricular hypertrophy. The pt did have evidence of diastolic dysfunction. The RV appeared normal   Past Surgical History: History reviewed. No pertinent past surgical history. HPI:  51 y.o. 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 ETT 5/15-5/16. She still has 3 untreated aneurysms which do need treatment per Dr. Christella Noa.  Post-extubation vocal quality was hoarse and swallow evaluation was deferred until this morning in anticipation of a better outcome.   Assessment / Plan / Recommendation Clinical Impression  Demonstrates a functional oral-pharyngeal swallow with tested consistencies.  No obvious cognitive-linguistic deficits.  Await PT/OT input if an assessment is warranted.    Aspiration Risk  None    Diet Recommendation Regular;Thin  liquid   Follow Up Recommendations  None    Pertinent Vitals/Pain n/a     Swallow Study Oral/Motor/Sensory Function Overall Oral Motor/Sensory Function: Appears within functional limits for tasks assessed   Ice Chips Ice chips: Not tested   Thin Liquid Thin Liquid: Within functional limits Presentation: Cup;Self Fed;Straw    Nectar Thick Nectar Thick Liquid: Not tested   Honey Thick Honey Thick Liquid: Not tested   Puree Puree: Within functional limits Presentation: Self Fed;Spoon   Solid Solid: Within functional limits Presentation: Wolsey, M.S.,CCC-SLP Pager [phone] 03/05/2012,8:53 AM

## 2012-03-05 NOTE — Progress Notes (Signed)
eLink Physician-Brief Progress Note Patient Name: Desarai Wickes DOB: 1961-08-30 MRN: [id number]  Date of Service  03/05/2012   HPI/Events of Note  Hypoxemia with ongoing resp failure   eICU Interventions  May need reintubation.  Will give albuterol first and bilevel   Intervention Category Major Interventions: Respiratory failure - evaluation and management  Asencion Noble 03/05/2012, 7:10 PM

## 2012-03-05 NOTE — Progress Notes (Signed)
PT/OT Cancellation Note  Treatment cancelled today due to medical issues with patient which prohibited therapy.  Pt with bedrest orders and with ventricular catheter that cannot be clamped at this time.  Will sign off of PT/OT at this time. Please re-order when pt is appropriate to participate in PT/OT.  Thank you!  03/05/2012 Darrol Jump OTR/L Pager [phone] Office [phone]

## 2012-03-06 ENCOUNTER — Inpatient Hospital Stay (HOSPITAL_COMMUNITY): Payer: No Typology Code available for payment source

## 2012-03-06 DIAGNOSIS — I517 Cardiomegaly: Secondary | ICD-10-CM

## 2012-03-06 LAB — COMPREHENSIVE METABOLIC PANEL
ALT: <5 U/L (ref 0–35)
Alkaline Phosphatase: 80 U/L (ref 39–117)
BUN: 12 mg/dL (ref 6–23)
CO2: 19 mEq/L (ref 19–32)
Calcium: 8.5 mg/dL (ref 8.4–10.5)
GFR calc Af Amer: 37 mL/min — ABNORMAL LOW (ref >90)
GFR calc non Af Amer: 32 mL/min — ABNORMAL LOW (ref >90)
Glucose, Bld: 116 mg/dL — ABNORMAL HIGH (ref 70–99)
Potassium: 3.6 mEq/L (ref 3.5–5.1)
Sodium: 143 mEq/L (ref 135–145)
Total Protein: 6 g/dL (ref 6.0–8.3)

## 2012-03-06 LAB — GLUCOSE, CAPILLARY
Glucose-Capillary: 130 mg/dL — ABNORMAL HIGH (ref 70–99)
Glucose-Capillary: 151 mg/dL — ABNORMAL HIGH (ref 70–99)
Glucose-Capillary: 160 mg/dL — ABNORMAL HIGH (ref 70–99)
Glucose-Capillary: 162 mg/dL — ABNORMAL HIGH (ref 70–99)

## 2012-03-06 LAB — BLOOD GAS, ARTERIAL
Bicarbonate: 16.5 mEq/L — ABNORMAL LOW (ref 20.0–24.0)
O2 Saturation: 89.9 %
Patient temperature: 98.7

## 2012-03-06 LAB — CBC
HCT: 34.3 % — ABNORMAL LOW (ref 36.0–46.0)
MCHC: 32.9 g/dL (ref 30.0–36.0)
Platelets: 194 10*3/uL (ref 150–400)
RDW: 15.3 % (ref 11.5–15.5)
WBC: 11.2 10*3/uL — ABNORMAL HIGH (ref 4.0–10.5)

## 2012-03-06 MED ORDER — POTASSIUM CHLORIDE 10 MEQ/100ML IV SOLN
10.0000 meq | INTRAVENOUS | Status: AC
  Administered 2012-03-06 (×2): 10 meq via INTRAVENOUS
  Filled 2012-03-06: qty 200

## 2012-03-06 MED ORDER — LEVETIRACETAM 500 MG PO TABS
500.0000 mg | ORAL_TABLET | Freq: Two times a day (BID) | ORAL | Status: DC
  Administered 2012-03-06 – 2012-03-14 (×16): 500 mg via ORAL
  Filled 2012-03-06 (×19): qty 1

## 2012-03-06 NOTE — Progress Notes (Signed)
Stable. Talking f/c. ivc working well. Increase of resp rate. Chest xray showed atelectasis. To get abg today

## 2012-03-06 NOTE — Progress Notes (Signed)
Pt was more lethargic at this time as compared to previous assessments. Very difficult to arouse even with painful stimuli. Dr. Joya Salm was notified and orders were given to lower the drain to 8 cm H20 and order a CT scan if patient does not improve. Re-evaluated patient 15 minutes later along with another RN and she was more responsive, but still remains lethargic. Will continue to monitor.

## 2012-03-06 NOTE — Consult Note (Signed)
Name: [name] MRN: [id number] DOB: 23-Feb-1961    LOS: 3  PULMONARY / CRITICAL CARE MEDICINE  The patient is encephalopathic and unable to provide history, which was obtained for available medical records.  HPI:  51 yo with history of HTN and previous intraparenchymal hemorrhage who presented to [facility] ED on 5/15 hypertensive with nausea, vomiting, headache and was found to have [place]. Intubated.  Ventriculostomy placed.   Brief patient description:  51 yo with history of HTN and previous intraparenchymal hemorrhage who presented to [facility] ED on 5/15 hypertensive with nasuea, vomiting, headache and was found to have [place]. Intubated.  Ventriculostomy placed.  Events Since Admission: 5/15  Presented to [facility] ED hypertensive with nasuea, vomiting, headache and was found to have [place]. Intubated.  Ventriculostomy placed. 5/16- extubated successfully 5/17- some hypoxia, off nicardipine drip now  Vital Signs: Temp:  [98 F (36.7 C)-98.9 F (37.2 C)] 98 F (36.7 C) (05/18 0343) Pulse Rate:  [63-82] 71  (05/18 0700) Resp:  [2-37] 24  (05/18 0700) BP: (118-208)/(59-114) 160/73 mmHg (05/18 0700) SpO2:  [88 %-100 %] 99 % (05/18 0700) FiO2 (%):  [40 %-100 %] 100 % (05/18 0100)  Physical Examination: General:  Extubated, no distress speaking Neuro:  Awake, follows commands, orientation ok HEENT:  PErrl Neck: no JVD   Cardiovascular:  RRR, no M/R/G Lungs:  Reduced, some crackles Abdomen:  Soft, nontender, nondistended, bowel sounds present Musculoskeletal:  Moves all extremities, no pedal edema Skin:  No rash  Principal Problem:  *SAH (subarachnoid hemorrhage) Active Problems:  HYPERLIPIDEMIA-MIXED  CAD, NATIVE VESSEL  DIASTOLIC HEART FAILURE, CHRONIC  CHRONIC KIDNEY DISEASE UNSPECIFIED  Hypertensive emergency  Acute respiratory failure  ASSESSMENT AND PLAN  PULMONARY  Lab 03/05/12 2007 03/03/12 2227  PHART 7.414* 7.313*  PCO2ART 30.1* 42.3  PO2ART 81.7 348.0*  HCO3 18.8* 20.9  O2SAT  96.3 99.8   Ventilator Settings: Vent Mode:  [-]  FiO2 (%):  [40 %-100 %] 100 %  CXR:  Small lung volumes ETT:  5/15 >>>5/16  A:  Acute respiratory failure, episode hypoxia 5/17 P:   -no distress this am noted -pcxr maybe some int prominence -IS when able Contribution from MS likely when this occurred, now more clear sensorium -No current role NIMV -O2 support, hope to reduce this -establish volume status, likely will need a cvp line today or in am , will follow clinical status With day 3 and risk Vasospasm upon [place], reluctant to have even ro neg balance now with no overt evidence failure -assess echo -suction as needed  CARDIOVASCULAR  Lab 03/03/12 1831  TROPONINI <0.30  LATICACIDVEN --  PROBNP --   ECG:  NA Lines: NA  A: Hypertensive emergency.  History of HTN, CAD, CHF (diastolic). SVt this am  P:  - Cardene gtt off,  oral load meds, clonidine, hydralazine, coreg -nipodipine -echo assessment -tele -likley needs cvp, will place likely today 25% reduction from higherst MAP met, MAP goals HHH MAP 85-105   RENAL  Lab 03/06/12 0428 03/05/12 0645 03/05/12 0051 03/04/12 0500 03/03/12 1830  NA 143 143 142 141 138  K 3.6 3.8 -- -- --  CL 114* 112 110 112 106  CO2 19 21 19 19  18*  BUN 12 12 11 16 18   CREATININE 1.80* 1.89* 1.83* 1.79* 1.76*  CALCIUM 8.5 8.5 8.7 7.8* 9.3  MG -- 1.7 -- -- --  PHOS -- 4.4 -- -- --   Intake/Output      05/17 0701 - 05/18 0700  05/18 0701 - 05/19 0700   P.O. 170    I.V. (mL/kg) 3501.3 (49.2)    IV Piggyback 562    Total Intake(mL/kg) 4233.3 (59.5)    Urine (mL/kg/hr) 2420 (1.4)    Drains 313    Total Output 2733    Net +1500.3          Foley:  5/15 >>>  A:  Acute on chronic renal failure.  Hypokalemia mild but svt run P:   - NS to 100 cc /hr, see pulm -pcxr in am  Chem in am  -replace K with prior svt  GASTROINTESTINAL  Lab 03/06/12 0428  AST 11  ALT <5  ALKPHOS 80  BILITOT 0.3  PROT 6.0  ALBUMIN 2.5*    A:  No  active issues P:   Diet ppi  HEMATOLOGIC  Lab 03/06/12 0428 03/05/12 0645 03/05/12 0051 03/04/12 0500 03/03/12 1830  HGB 11.3* 12.1 12.9 12.1 14.7  HCT 34.3* 36.9 38.4 36.0 42.9  PLT 194 182 215 195 215  INR -- -- -- -- 0.98  APTT -- -- -- -- 30   A:  No active issues P:  -scd  INFECTIOUS  Lab 03/06/12 0428 03/05/12 0645 03/05/12 0051 03/04/12 0500 03/03/12 1830  WBC 11.2* 8.5 10.5 8.9 7.1  PROCALCITON -- -- -- -- --   Cultures: NA Antibiotics: Ancef 5/15 >>>per NS  A:  No active issues P:   Monitor fever curve  ENDOCRINE  Lab 03/06/12 0345 03/05/12 2347 03/05/12 1935 03/05/12 1557 03/05/12 1104  GLUCAP 151* 160* 139* 110* 136*   A:  Hyperglycemia.   P:   - SSI / CBG  NEUROLOGIC  Head CT:  Diffuse SAH with ventriculomegaly Angio: >>>coiled, 3 remain  A:  SAH, nontraumatic s/p ventriculostomy P:   - drain  Per Neurosurgery - Keppra -nimodipine -keep pos for now, risk vasospasm at day 3 coil rises -tcd Monday May need repeat imaging CT head, per NS  BEST PRACTICE / DISPOSITION - Level of Care:  ICU - Primary Service:  Neurosurgery - Consultants:  PCCM - Code Status:  Full - Diet:  NPO - DVT Px:  SCDs - GI Px:  Protonix - Skin Integrity:  No issues - Social / Family:  Family is not available  Raylene Miyamoto., M.D. Pulmonary and [place] Pager: [phone]  03/06/2012, 7:39 AM

## 2012-03-06 NOTE — Progress Notes (Signed)
  Subjective: Pt c/o mild HA, some dyspnea; no nausea/vomiting  Objective: Vital signs in last 24 hours: Temp:  [98 F (36.7 C)-98.9 F (37.2 C)] 98.7 F (37.1 C) (05/18 0745) Pulse Rate:  [63-82] 75  (05/18 1000) Resp:  [2-39] 30  (05/18 1000) BP: (118-187)/(59-98) 156/73 mmHg (05/18 1000) SpO2:  [88 %-100 %] 95 % (05/18 1000) FiO2 (%):  [40 %-100 %] 50 % (05/18 0900)    Intake/Output from previous day: 05/17 0701 - 05/18 0700 In: 4233.3 [P.O.:170; I.V.:3501.3; IV Piggyback:562] Out: 2733 [Urine:2420; Drains:313] Intake/Output this shift: Total I/O In: 565 [P.O.:60; I.V.:300; IV Piggyback:205] Out: 474 [Urine:425; Drains:49]  Pt awake/alert, following commands, moving all four ext well, speech nl ,face symm,  tongue midline, PERRL; chest-decreased BS bases, heart -RRR, abd.- soft, + BS, NT, rt groin clean and dry,NT, no hematoma;  ventric draining blood-tinged CSF  Lab Results:   Basename 03/06/12 0428 03/05/12 0645  WBC 11.2* 8.5  HGB 11.3* 12.1  HCT 34.3* 36.9  PLT 194 182   BMET  Basename 03/06/12 0428 03/05/12 0645  NA 143 143  K 3.6 3.8  CL 114* 112  CO2 19 21  GLUCOSE 116* 97  BUN 12 12  CREATININE 1.80* 1.89*  CALCIUM 8.5 8.5   PT/INR  Basename 03/03/12 1830  LABPROT 13.2  INR 0.98   ABG  Basename 03/05/12 2007 03/03/12 2227  PHART 7.414* 7.313*  HCO3 18.8* 20.9    Studies/Results: Dg Chest Port 1 View  03/06/2012  *RADIOLOGY REPORT*  Clinical Data: Progress evaluation.  Assess lung volumes.  PORTABLE CHEST - 1 VIEW  Comparison: Portable chest 03/05/2012  Findings: The heart is enlarged.  Bibasilar airspace disease has slightly increased.  The small bilateral effusions are suspected. Mild pulmonary vascular congestion is evident.  IMPRESSION:  1.  Increased bibasilar airspace disease, likely reflecting atelectasis. 2.  Small bilateral pleural effusions are suspected. 3.  Stable cardiomegaly. 4.  Pulmonary vascular congestion.  Original Report  Authenticated By: Resa Miner. MATTERN, M.D.   Dg Chest Port 1 View  03/05/2012  *RADIOLOGY REPORT*  Clinical Data: Atelectasis.  PORTABLE CHEST - 1 VIEW  Comparison: 03/04/2012  Findings: Cardiomegaly.  Interval removal of endotracheal tube and NG tube.  Increasing bibasilar atelectasis and mild vascular congestion. Mild cardiomegaly.  IMPRESSION: Increasing perihilar and bibasilar atelectasis.  Increasing vascular congestion.  Original Report Authenticated By: Raelyn Number, M.D.    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  LOS: 3 days    Bucky Grigg,D [facility] 03/06/2012

## 2012-03-06 NOTE — Progress Notes (Signed)
  Echocardiogram 2D Echocardiogram has been performed.  Fatima Fedie, Orlena Sheldon 03/06/2012, 10:37 AM

## 2012-03-07 ENCOUNTER — Inpatient Hospital Stay (HOSPITAL_COMMUNITY): Payer: No Typology Code available for payment source

## 2012-03-07 DIAGNOSIS — I10 Essential (primary) hypertension: Secondary | ICD-10-CM

## 2012-03-07 LAB — BLOOD GAS, ARTERIAL
Acid-base deficit: 4.2 mmol/L — ABNORMAL HIGH (ref 0.0–2.0)
Drawn by: 13898
O2 Content: 15 L/min
pCO2 arterial: 29.8 mmHg — ABNORMAL LOW (ref 35.0–45.0)
pO2, Arterial: 83.3 mmHg (ref 80.0–100.0)

## 2012-03-07 LAB — CARDIAC PANEL(CRET KIN+CKTOT+MB+TROPI)
Relative Index: 1 (ref 0.0–2.5)
Relative Index: 0.6 (ref 0.0–2.5)
Total CK: 212 U/L — ABNORMAL HIGH (ref 7–177)
Troponin I: <0.3 ng/mL (ref <0.30)
Troponin I: <0.3 ng/mL (ref <0.30)

## 2012-03-07 LAB — COMPREHENSIVE METABOLIC PANEL
ALT: <5 U/L (ref 0–35)
AST: 9 U/L (ref 0–37)
Albumin: 2.5 g/dL — ABNORMAL LOW (ref 3.5–5.2)
CO2: 19 mEq/L (ref 19–32)
Chloride: 110 mEq/L (ref 96–112)
Creatinine, Ser: 1.65 mg/dL — ABNORMAL HIGH (ref 0.50–1.10)
GFR calc non Af Amer: 36 mL/min — ABNORMAL LOW (ref >90)
Potassium: 3.7 mEq/L (ref 3.5–5.1)
Sodium: 140 mEq/L (ref 135–145)
Total Bilirubin: 0.3 mg/dL (ref 0.3–1.2)

## 2012-03-07 LAB — DIFFERENTIAL
Basophils Relative: 0 % (ref 0–1)
Eosinophils Relative: 1 % (ref 0–5)
Lymphocytes Relative: 15 % (ref 12–46)
Monocytes Absolute: 0.6 10*3/uL (ref 0.1–1.0)
Monocytes Relative: 7 % (ref 3–12)
Neutro Abs: 6.8 10*3/uL (ref 1.7–7.7)

## 2012-03-07 LAB — GLUCOSE, CAPILLARY
Glucose-Capillary: 118 mg/dL — ABNORMAL HIGH (ref 70–99)
Glucose-Capillary: 117 mg/dL — ABNORMAL HIGH (ref 70–99)

## 2012-03-07 LAB — PRO B NATRIURETIC PEPTIDE: Pro B Natriuretic peptide (BNP): 7892 pg/mL — ABNORMAL HIGH (ref 0–125)

## 2012-03-07 LAB — CBC
MCV: 86.5 fL (ref 78.0–100.0)
Platelets: 177 10*3/uL (ref 150–400)
RBC: 4.06 MIL/uL (ref 3.87–5.11)
RDW: 15.1 % (ref 11.5–15.5)
WBC: 8.8 10*3/uL (ref 4.0–10.5)

## 2012-03-07 MED ORDER — HYDRALAZINE HCL 20 MG/ML IJ SOLN
10.0000 mg | INTRAMUSCULAR | Status: DC | PRN
  Administered 2012-03-07 (×3): 20 mg via INTRAVENOUS
  Filled 2012-03-07: qty 2

## 2012-03-07 MED ORDER — HYDRALAZINE HCL 20 MG/ML IJ SOLN
10.0000 mg | INTRAMUSCULAR | Status: DC | PRN
  Administered 2012-03-07 – 2012-03-10 (×10): 20 mg via INTRAVENOUS
  Administered 2012-03-11: 40 mg via INTRAVENOUS
  Filled 2012-03-07 (×4): qty 1
  Filled 2012-03-07: qty 2
  Filled 2012-03-07 (×7): qty 1

## 2012-03-07 MED ORDER — ATROPINE SULFATE 0.1 MG/ML IJ SOLN
1.0000 mg | Freq: Once | INTRAMUSCULAR | Status: DC
  Filled 2012-03-07: qty 10

## 2012-03-07 MED ORDER — HYDRALAZINE HCL 20 MG/ML IJ SOLN
10.0000 mg | INTRAMUSCULAR | Status: DC | PRN
  Administered 2012-03-07 (×2): 20 mg via INTRAVENOUS
  Filled 2012-03-07 (×2): qty 1

## 2012-03-07 MED ORDER — CLONIDINE HCL 0.3 MG PO TABS
0.3000 mg | ORAL_TABLET | Freq: Two times a day (BID) | ORAL | Status: DC
  Administered 2012-03-07 – 2012-03-10 (×8): 0.3 mg via ORAL
  Filled 2012-03-07 (×11): qty 1

## 2012-03-07 NOTE — Progress Notes (Signed)
Pt. Developed bradycardia, with a rate down to 34 bpm and a 2.44 second pause. No change in patient's mental status and un symptomatic. Dr. Titus Mould contacted. Orders received for stat 12 lead EKG, stop Coreg, atropine 1 mg ivp ready at bedside, and place pacer pads on patient. Will continue to monitor.

## 2012-03-07 NOTE — Progress Notes (Signed)
Patient is currently going in and out of sinus tachycardia. Critical care MD was notified and ordered 12 lead and cardiac panel. Will continue to monitor.

## 2012-03-07 NOTE — Consult Note (Signed)
Name: [name] MRN: [id number] DOB: 12-22-1960    LOS: 4  PULMONARY / CRITICAL CARE MEDICINE  The patient is encephalopathic and unable to provide history, which was obtained for available medical records.  HPI:  51 yo with history of HTN and previous intraparenchymal hemorrhage who presented to [facility] ED on 5/15 hypertensive with nausea, vomiting, headache and was found to have [place]. Intubated.  Ventriculostomy placed.   Brief patient description:  51 yo with history of HTN and previous intraparenchymal hemorrhage who presented to [facility] ED on 5/15 hypertensive with nasuea, vomiting, headache and was found to have [place]. Intubated.  Ventriculostomy placed.  Events Since Admission: 5/15  Presented to [facility] ED hypertensive with nasuea, vomiting, headache and was found to have [place]. Intubated.  Ventriculostomy placed. 5/16- extubated successfully 5/17- some hypoxia, off nicardipine drip now 5/18- variable O2 needs, pcxr edema, tachy, trop neg  Vital Signs: Temp:  [98.4 F (36.9 C)-99.2 F (37.3 C)] 98.6 F (37 C) (05/19 0400) Pulse Rate:  [61-106] 67  (05/19 0700) Resp:  [18-39] 23  (05/19 0700) BP: (148-188)/(69-92) 171/83 mmHg (05/19 0700) SpO2:  [90 %-100 %] 96 % (05/19 0743) FiO2 (%):  [50 %] 50 % (05/19 0755)  Physical Examination: General:  Extubated, no distress speaking Neuro:  Awake, follows commands, orientation ok HEENT:  PErrl Neck: jvd difficult to assess Cardiovascular:  RRR, no M/R/G Lungs:  Crackles bases Abdomen:  Soft, nontender, nondistended, bowel sounds present Musculoskeletal:  Moves all extremities, no pedal edema Skin:  No rash  Principal Problem:  *SAH (subarachnoid hemorrhage) Active Problems:  HYPERLIPIDEMIA-MIXED  CAD, NATIVE VESSEL  DIASTOLIC HEART FAILURE, CHRONIC  CHRONIC KIDNEY DISEASE UNSPECIFIED  Hypertensive emergency  Acute respiratory failure  ASSESSMENT AND PLAN  PULMONARY  Lab 03/07/12 0505 03/06/12 1040 03/05/12 2007 03/03/12 2227    PHART 7.429* 7.419* 7.414* 7.313*  PCO2ART 29.8* 26.1* 30.1* 42.3  PO2ART 83.3 57.6* 81.7 348.0*  HCO3 19.4* 16.5* 18.8* 20.9  O2SAT 97.3 89.9 96.3 99.8   Ventilator Settings: Vent Mode:  [-]  FiO2 (%):  [50 %] 50 %  CXR:  Small lung volumes ETT:  5/15 >>>5/16  A:  Acute respiratory failure, episode hypoxia 5/17 and 5/18, edema P:   -diastolic heart failure, appears overloaded -was neg on own yesterday, but now about to enter risk period vasospasm today -control heart rate for daistolic disease -consider cvp -reluctant to diuresis at all , but do not want intubation -IS if able -pcxr in am  -O2 support -abg reviewed, improved o2 needs -control afterload better will improve edema likely  CARDIOVASCULAR  Lab 03/07/12 0319 03/06/12 2130 03/03/12 1831  TROPONINI <0.30 <0.30 <0.30  LATICACIDVEN -- -- --  PROBNP 7892.0* -- --   ECG:  NA Lines: NA Echo 0000000- grade 2 diastolic disease, IVC dilated , 55%  A: Hypertensive emergency.  History of HTN, CAD, CHF (diastolic). SVt once self resolved P:  - oral load meds, clonidine, hydralazine, coreg -nipodipine continue -echo assessment reviewed -tele -likley needs cvp , will consent for today  25% reductyion of BP  X 3 days complete, with edema, control further slight, maintaining HHH still -MAP goal was higher as came in HTN emergency, MAP now 111, goal to MAP 85 -increase clonidine -avoid tachy with diastolic disease  RENAL  Lab 03/07/12 0318 03/06/12 0428 03/05/12 0645 03/05/12 0051 03/04/12 0500  NA 140 143 143 142 141  K 3.7 3.6 -- -- --  CL 110 114* 112 110 112  CO2  19 19 21 19 19   BUN 13 12 12 11 16   CREATININE 1.65* 1.80* 1.89* 1.83* 1.79*  CALCIUM 8.8 8.5 8.5 8.7 7.8*  MG -- -- 1.7 -- --  PHOS -- -- 4.4 -- --   Intake/Output      05/18 0701 - 05/19 0700 05/19 0701 - 05/20 0700   P.O. 1020    I.V. (mL/kg) 1350 (19)    IV Piggyback 405    Total Intake(mL/kg) 2775 (39)    Urine (mL/kg/hr) 3845 (2.3)     Drains 300    Total Output 4145    Net -1370          Foley:  5/15 >>>  A:  Acute on chronic renal failure.  Hypokalemia mild but svt run, improved fxn R/o CSW P:   - NS to 100 cc /hr, but was neg, CSW? Urine na noted -no florinef with heart failure -see pulm -even to slight pos balance goal, pending cvp and pulm status  GASTROINTESTINAL  Lab 03/07/12 0318 03/06/12 0428  AST 9 11  ALT <5 <5  ALKPHOS 74 80  BILITOT 0.3 0.3  PROT 6.3 6.0  ALBUMIN 2.5* 2.5*    A:  No active issues P:   Diet ppi lft in am   HEMATOLOGIC  Lab 03/07/12 0318 03/06/12 0428 03/05/12 0645 03/05/12 0051 03/04/12 0500 03/03/12 1830  HGB 11.8* 11.3* 12.1 12.9 12.1 --  HCT 35.1* 34.3* 36.9 38.4 36.0 --  PLT 177 194 182 215 195 --  INR -- -- -- -- -- 0.98  APTT -- -- -- -- -- 30   A:  No active issues P:  -scd  INFECTIOUS  Lab 03/07/12 0318 03/06/12 0428 03/05/12 0645 03/05/12 0051 03/04/12 0500  WBC 8.8 11.2* 8.5 10.5 8.9  PROCALCITON -- -- -- -- --   Cultures: NA Antibiotics: Ancef 5/15 >>>per NS  A:  No active issues P:   Monitor fever curve  ENDOCRINE  Lab 03/07/12 0734 03/07/12 0412 03/07/12 03/06/12 2021 03/06/12 1524  GLUCAP 117* 118* 118* 162* 122*   A:  Hyperglycemia.   P:   - SSI dc / CBG  NEUROLOGIC  Head CT:  Diffuse SAH with ventriculomegaly Angio: >>>coiled, 3 remain  A:  SAH, nontraumatic s/p ventriculostomy P:   - drain  Per Neurosurgery - Keppra -nimodipine -keep pos to even but reduce afterload further -tcd Monday will be important -if tcd pos, add statin  BEST PRACTICE / DISPOSITION - Level of Care:  ICU - Primary Service:  Neurosurgery - Consultants:  PCCM - Code Status:  Full - Diet:  NPO - DVT Px:  SCDs - GI Px:  Protonix - Skin Integrity:  No issues - Social / Family:  Family is not available  Raylene Miyamoto., M.D. Pulmonary and [place] Pager: [phone]  03/07/2012, 8:01 AM

## 2012-03-07 NOTE — Progress Notes (Signed)
Patient ID: [name], female   DOB: Jan 09, 1961, 51 y.o.   MRN: [id number] Sleepy. Moves all 4 extremities.ekg changes seen.ivc working well. Ccm helping

## 2012-03-08 ENCOUNTER — Inpatient Hospital Stay (HOSPITAL_COMMUNITY): Payer: No Typology Code available for payment source

## 2012-03-08 LAB — COMPREHENSIVE METABOLIC PANEL
Albumin: 2.8 g/dL — ABNORMAL LOW (ref 3.5–5.2)
BUN: 22 mg/dL (ref 6–23)
Calcium: 9.1 mg/dL (ref 8.4–10.5)
Creatinine, Ser: 1.84 mg/dL — ABNORMAL HIGH (ref 0.50–1.10)
Total Protein: 7 g/dL (ref 6.0–8.3)

## 2012-03-08 LAB — CBC
HCT: 37 % (ref 36.0–46.0)
Hemoglobin: 12.5 g/dL (ref 12.0–15.0)
MCV: 86.2 fL (ref 78.0–100.0)
Platelets: 197 10*3/uL (ref 150–400)
RBC: 4.29 MIL/uL (ref 3.87–5.11)
WBC: 9.9 10*3/uL (ref 4.0–10.5)

## 2012-03-08 LAB — CARDIAC PANEL(CRET KIN+CKTOT+MB+TROPI)
CK, MB: 1.3 ng/mL (ref 0.3–4.0)
Relative Index: 0.6 (ref 0.0–2.5)
Troponin I: <0.3 ng/mL (ref <0.30)

## 2012-03-08 LAB — DIFFERENTIAL
Eosinophils Relative: 0 % (ref 0–5)
Lymphocytes Relative: 13 % (ref 12–46)
Lymphs Abs: 1.3 10*3/uL (ref 0.7–4.0)
Monocytes Absolute: 0.5 10*3/uL (ref 0.1–1.0)
Monocytes Relative: 5 % (ref 3–12)

## 2012-03-08 LAB — GLUCOSE, CAPILLARY: Glucose-Capillary: 136 mg/dL — ABNORMAL HIGH (ref 70–99)

## 2012-03-08 MED ORDER — METOPROLOL TARTRATE 1 MG/ML IV SOLN
2.5000 mg | Freq: Four times a day (QID) | INTRAVENOUS | Status: DC
  Administered 2012-03-08 – 2012-03-09 (×4): 2.5 mg via INTRAVENOUS
  Filled 2012-03-08 (×9): qty 5

## 2012-03-08 MED ORDER — METOPROLOL TARTRATE 1 MG/ML IV SOLN
INTRAVENOUS | Status: AC
  Administered 2012-03-08: 5 mg via INTRAVENOUS
  Filled 2012-03-08: qty 5

## 2012-03-08 MED ORDER — METOPROLOL TARTRATE 1 MG/ML IV SOLN
5.0000 mg | Freq: Once | INTRAVENOUS | Status: AC
  Administered 2012-03-08: 5 mg via INTRAVENOUS

## 2012-03-08 MED ORDER — CLONIDINE HCL 0.1 MG PO TABS
0.1000 mg | ORAL_TABLET | Freq: Once | ORAL | Status: AC
  Administered 2012-03-08: 0.1 mg via ORAL
  Filled 2012-03-08: qty 1

## 2012-03-08 NOTE — Progress Notes (Signed)
CCM called to notify about increasing bouts of confusion and temperature. Highest was 102.1 at change of shift. Pt given tylenol but does not break fever. Dr. Elsworth Soho notified and ordered x2 blood cultures.

## 2012-03-08 NOTE — Progress Notes (Signed)
Subjective: Patient  is lethargic.    Objective: Vital signs in last 24 hours: Temp:  [98 F (36.7 C)-102.1 F (38.9 C)] 99.9 F (37.7 C) (05/20 1525) Pulse Rate:  [59-148] 67  (05/20 1800) Resp:  [15-29] 23  (05/20 1800) BP: (120-193)/(68-105) 180/71 mmHg (05/20 1800) SpO2:  [85 %-99 %] 95 % (05/20 1800) FiO2 (%):  [50 %] 50 % (05/20 0000) Weight:  [82.2 kg (181 lb 3.5 oz)] 82.2 kg (181 lb 3.5 oz) (05/20 0530)  Intake/Output from previous day: 05/19 0701 - 05/20 0700 In: [id number] [P.O.:420; I.V.:1150] Out: 2790 [Urine:2415; Drains:375] Intake/Output this shift: Total I/O In: 990 [P.O.:240; I.V.:550; IV Piggyback:200] Out: 1855 [Urine:1855]  lethargic, follows commands. Ventric pulled earlier today. perrl, speech clear and fluent.moving all extremities. symmetric facial movements. tongue and uvula midline.  Lab Results:  Basename 03/08/12 0425 03/07/12 0318  WBC 9.9 8.8  HGB 12.5 11.8*  HCT 37.0 35.1*  PLT 197 177   BMET  Basename 03/08/12 0425 03/07/12 0318  NA 137 140  K 3.8 3.7  CL 105 110  CO2 18* 19  GLUCOSE 121* 126*  BUN 22 13  CREATININE 1.84* 1.65*  CALCIUM 9.1 8.8    Studies/Results: Ct Head Wo Contrast  03/08/2012  *RADIOLOGY REPORT*  Clinical Data: Ventriculostomy removal.  Aneurysm coiling. Subarachnoid hemorrhage.  CT HEAD WITHOUT CONTRAST  Technique:  Contiguous axial images were obtained from the base of the skull through the vertex without contrast.  Comparison: 03/07/2011 and multiple previous  Findings: Ventriculostomy previously placed from a right frontal approach has been removed.  Ventricular size is smaller than was seen yesterday.  Intraventricular blood layering dependently in the lateral ventricles and within the third ventricle remains evident. Subarachnoid blood is becoming less dense.  Low density along the previous ventriculostomy tract is seen.  No evidence of hemorrhage. Few tiny particles of plunged bone superficially.  Dense artifact  from coils in a basilar tip aneurysm.  Previous aneurysm clips in the right middle cerebral artery and right internal carotid region. No extra-axial collection.  IMPRESSION: Ventriculostomy removed.  Ventricles smaller than seen yesterday. Intraventricular blood remains evident.  No new bleeding.  No interval complication.  Original Report Authenticated By: Jules Schick, M.D.   Dg Chest Port 1 View  03/08/2012  *RADIOLOGY REPORT*  Clinical Data: Edema.  PORTABLE CHEST - 1 VIEW  Comparison: 03/07/2012.  Findings: Heart size stable.  There is diffuse bilateral air space disease, bibasilar predominant.  Probable bilateral pleural effusions.  IMPRESSION: Diffuse bilateral air space disease, bibasilar predominant, stable. Suspect bilateral pleural effusions.  Original Report Authenticated By: Luretha Rued, M.D.   Dg Chest Port 1 View  03/07/2012  *RADIOLOGY REPORT*  Clinical Data: Follow up edema  PORTABLE CHEST - 1 VIEW  Comparison: 03/06/2012  Findings: Diffuse bilateral airspace disease shows mild improvement suggesting improving pulmonary edema.  Bibasilar atelectasis and effusions.  Improved aeration in the right lung base.  Cardiac enlargement  IMPRESSION: Improved aeration.  Partial clearing of pulmonary edema. Improvement in right lower lobe atelectasis.  Original Report Authenticated By: Truett Perna, M.D.    Assessment/Plan: TCD's fairly unremarkable except for aca. Will not push too much secondary to the many aneurysms still not treated. Repeat head ct looked very good, ventricles were well decompressed. No blood in the 4th ventricle. I do believe we will not have to replace the catheter.   LOS: 5 days     Diara Chaudhari L 03/08/2012, 6:53 PM

## 2012-03-08 NOTE — Progress Notes (Signed)
Change of shift pt pulled out her IVC drain. Dr. Christella Noa was notified and ordered CT scan. After scan pt oriented x4 and was more lethargic and difficult to keep awake. Dr. Christella Noa was notified and came up and saw her. Order for TCDs.

## 2012-03-08 NOTE — Progress Notes (Signed)
Clinical Social Worker reviewed chart and spoke with RN.  Pt not fully alert and able to participate in conversation.  CSW to continue to follow and work with pt once more fully alert.   Dala Dock, MSW, [place]

## 2012-03-08 NOTE — Progress Notes (Signed)
@  0130 patient heart rate became elevated into the 140's to 150's, heart rate would drop back into Sinus rhythm intermittently . MD was notified; new orders were given. Patient's heart rate did not improve with metoprolol or labetalol. MD again notified of patient's heart rate; no further orders were given. Will continue to monitor.

## 2012-03-08 NOTE — Progress Notes (Addendum)
Patient pulled out Intraventricular Catheter at approximately 0700 this am, Neuro assessment was performed;  patient moved all extremities, followed commands, was alert and oriented, pupils were equal and reactive. MD was notified; no new orders were given, will continue to monitor.

## 2012-03-08 NOTE — Progress Notes (Addendum)
VASCULAR LAB PRELIMINARY  PRELIMINARY  PRELIMINARY  PRELIMINARY      Transcranial Doppler  Date POD PCO2 HCT BP  MCA ACA PCA OPHT SIPH VERT Basilar  5/20     Right  Left   89  41   124     32  25   18  21      36   28       46    5-22 SB     Right  Left   66  44   -37  -38   -20     20  14    43  52   -24  -19     -56    5-24 SB     Right  Left   90  45   -55  -43     19   24  21    28/22  55     -25     -34    5-27 VS      Right  Left   77 30     -59  -60   36  18   30  22   27  30      -26     -30     5-29 VS     Right  Left   74  29   -33   -45    22  30   21  19    33  30   -24  -29     -54    5/31  fgn     Right  Left   97     129  40   29     18  23      50                   Right  Left                                        MCA = Middle Cerebral Artery      OPHT = Opthalmic Artery     BASILAR = Basilar Artery   ACA = Anterior Cerebral Artery     SIPH = Carotid Siphon PCA = Posterior Cerebral Artery   VERT = Verterbral Artery                   Normal MCA = 62+\-12 ACA = 50+\-12 PCA = 42+\-23   Margarette Canada  RVT 4:54 PM 03/08/2012

## 2012-03-08 NOTE — Progress Notes (Signed)
Name: [name] MRN: [id number] DOB: 1961/02/27    LOS: 5  PULMONARY / CRITICAL CARE MEDICINE  Brief patient description:  51 yo with history of HTN and previous intraparenchymal hemorrhage who presented to [facility] ED on 5/15 hypertensive with nasuea, vomiting, headache and was found to have [place]. Intubated.  Ventriculostomy placed.  Events Since Admission: 5/15  Presented to [facility] ED hypertensive with nasuea, vomiting, headache and was found to have [place]. Intubated.  Ventriculostomy placed. 5/16  Extubated successfully 5/17  Some hypoxia, off nicardipine drip now 5/18  Variable O2 needs, pcxr edema, tachy, trop neg 5/19  Pulled out ventriculostomy catheter, episodes of tachycardia  Vital Signs: Temp:  [98 F (36.7 C)-102.1 F (38.9 C)] 102.1 F (38.9 C) (05/20 0749) Pulse Rate:  [60-148] 148  (05/20 0730) Resp:  [17-29] 25  (05/20 0730) BP: (116-194)/(59-104) 177/91 mmHg (05/20 0730) SpO2:  [85 %-97 %] 94 % (05/20 0730) FiO2 (%):  [50 %] 50 % (05/20 0000) Weight:  [82.2 kg (181 lb 3.5 oz)] 82.2 kg (181 lb 3.5 oz) (05/20 0530)  Physical Examination: General:  No acute distress Neuro:  Awake, alert HEENT:  PERRL Neck: No JVD Cardiovascular:  RRR, no M/R/G Lungs:  Diminished bilateral air entry, no added breath sounds Abdomen:  Soft, nontender, nondistended, bowel sounds present Musculoskeletal:  Moves all extremities, no pedal edema Skin:  No rash  Principal Problem:  *SAH (subarachnoid hemorrhage) Active Problems:  Hypertensive emergency  Acute respiratory failure  HYPERLIPIDEMIA-MIXED  CAD, NATIVE VESSEL  DIASTOLIC HEART FAILURE, CHRONIC  CHRONIC KIDNEY DISEASE UNSPECIFIED  ASSESSMENT AND PLAN  PULMONARY  Lab 03/07/12 0505 03/06/12 1040 03/05/12 2007 03/03/12 2227  PHART 7.429* 7.419* 7.414* 7.313*  PCO2ART 29.8* 26.1* 30.1* 42.3  PO2ART 83.3 57.6* 81.7 348.0*  HCO3 19.4* 16.5* 18.8* 20.9  O2SAT 97.3 89.9 96.3 99.8   Ventilator Settings: Vent Mode:  [-]  FiO2  (%):  [50 %] 50 %  CXR:  Small lung volumes ETT:  5/15 >>>5/16  A:  Acute respiratory failure - resolved. Several episodes of pulmonary edema on the background of diastolic CHF and IVF. Now minimal oxygen requirements.  No evidence of bronchospasm. P:   - Supplemental oxygen, goal SpO2 > 92% - Incentive spirometry - D/c Albuterol as tachycardia and no bronchospasm.  CARDIOVASCULAR  Lab 03/08/12 0425 03/07/12 2205 03/07/12 1818 03/07/12 0319 03/06/12 2130  TROPONINI <0.30 <0.30 <0.30 <0.30 <0.30  LATICACIDVEN -- -- -- -- --  PROBNP -- -- -- 7892.0* --   ECG:  NA Lines: NA TTE:  5/18 >>> Grade 2 diastolic disease, IVC dilated , EF 55%  A: Hypertensive emergency.  History of HTN, CAD, CHF (diastolic). SVT / sinus tachycardia. P:  - Clonidine, hydralazine - Goal SBP < 180 - Add Metoprolol 2.5 mg IV q6h as tachycardic and diastolic CHF  RENAL  Lab 0000000 0425 03/07/12 0318 03/06/12 0428 03/05/12 0645 03/05/12 0051  NA 137 140 143 143 142  K 3.8 3.7 -- -- --  CL 105 110 114* 112 110  CO2 18* 19 19 21 19   BUN 22 13 12 12 11   CREATININE 1.84* 1.65* 1.80* 1.89* 1.83*  CALCIUM 9.1 8.8 8.5 8.5 8.7  MG -- -- -- 1.7 --  PHOS -- -- -- 4.4 --   Intake/Output      05/19 0701 - 05/20 0700 05/20 0701 - 05/21 0700   P.O. 420    I.V. (mL/kg) 1150 (14)    IV Piggyback  Total Intake(mL/kg) 1570 (19.1)    Urine (mL/kg/hr) 2415 (1.2)    Drains 375    Total Output 2790    Net -1220          Foley:  5/15 >>>  A:  Acute on chronic renal failure.   P:   - NS 50 mL/h - No florinef with heart failure  GASTROINTESTINAL  Lab 03/08/12 0425 03/07/12 0318 03/06/12 0428  AST 10 9 11   ALT <5 <5 <5  ALKPHOS 74 74 80  BILITOT 0.3 0.3 0.3  PROT 7.0 6.3 6.0  ALBUMIN 2.8* 2.5* 2.5*   A:  No active issues P:   -  No intervention required  HEMATOLOGIC  Lab 03/08/12 0425 03/07/12 0318 03/06/12 0428 03/05/12 0645 03/05/12 0051 03/03/12 1830  HGB 12.5 11.8* 11.3* 12.1 12.9 --  HCT  37.0 35.1* 34.3* 36.9 38.4 --  PLT 197 177 194 182 215 --  INR -- -- -- -- -- 0.98  APTT -- -- -- -- -- 30   A:  No active issues P:  -  No intervention required  INFECTIOUS  Lab 03/08/12 0425 03/07/12 0318 03/06/12 0428 03/05/12 0645 03/05/12 0051  WBC 9.9 8.8 11.2* 8.5 10.5  PROCALCITON -- -- -- -- --   Cultures: NA Antibiotics: Ancef 5/15  >>>  A:  No evidence of infection.  Fever, likely CNS origin.  P:   - Monitor fever curve - ABx per neurosurgery  ENDOCRINE  Lab 03/07/12 1129 03/07/12 0734 03/07/12 0412 03/07/12 03/06/12 2021  GLUCAP 136* 117* 118* 118* 162*   A:  Hyperglycemia, resolved P:   -  No intervention required  NEUROLOGIC  Head CT:  5/16 >>> Diffuse SAH with ventriculomegaly Angio: 5/16 >>> Aneurism coiled, 3 remain TCD: 5/20 >>>  A:  SAH, nontraumatic, s/p ventriculostomy P:   - Ventriculostomy  - Keppra - Nimodipine  BEST PRACTICE / DISPOSITION - Level of Care:  ICU - Primary Service:  Neurosurgery - Consultants:  PCCM - Code Status:  Full - Diet:  Regular diet - DVT Px:  SCDs - GI Px:  Protonix - Skin Integrity:  No issues - Social / Family:  Family is not available  Doree Fudge, M.D. Pulmonary and [place] Pager: [phone]  03/08/2012, 8:36 AM

## 2012-03-08 NOTE — Progress Notes (Signed)
Subjective: Pt resting quietly. Sister says she has been awake and talking, wanting to go home. She did pull out ventriculostomy tube. Febrile this am to 102.  Objective: Physical Exam: BP 142/82  Pulse 69  Temp(Src) 102.1 F (38.9 C) (Oral)  Resp 15  Ht 5\' 1"  (1.549 m)  Wt 181 lb 3.5 oz (82.2 kg)  BMI 34.24 kg/m2  SpO2 98% Febrile, sleeping.   Labs: CBC  Basename 03/08/12 0425 03/07/12 0318  WBC 9.9 8.8  HGB 12.5 11.8*  HCT 37.0 35.1*  PLT 197 177   BMET  Basename 03/08/12 0425 03/07/12 0318  NA 137 140  K 3.8 3.7  CL 105 110  CO2 18* 19  GLUCOSE 121* 126*  BUN 22 13  CREATININE 1.84* 1.65*  CALCIUM 9.1 8.8   LFT  Basename 03/08/12 0425  PROT 7.0  ALBUMIN 2.8*  AST 10  ALT <5  ALKPHOS 74  BILITOT 0.3  BILIDIR --  IBILI --  LIPASE --   PT/INR No results found for this basename: LABPROT:2,INR:2 in the last 72 hours   Studies/Results: Dg Chest Port 1 View  03/08/2012  *RADIOLOGY REPORT*  Clinical Data: Edema.  PORTABLE CHEST - 1 VIEW  Comparison: 03/07/2012.  Findings: Heart size stable.  There is diffuse bilateral air space disease, bibasilar predominant.  Probable bilateral pleural effusions.  IMPRESSION: Diffuse bilateral air space disease, bibasilar predominant, stable. Suspect bilateral pleural effusions.  Original Report Authenticated By: Luretha Rued, M.D.   Dg Chest Port 1 View  03/07/2012  *RADIOLOGY REPORT*  Clinical Data: Follow up edema  PORTABLE CHEST - 1 VIEW  Comparison: 03/06/2012  Findings: Diffuse bilateral airspace disease shows mild improvement suggesting improving pulmonary edema.  Bibasilar atelectasis and effusions.  Improved aeration in the right lung base.  Cardiac enlargement  IMPRESSION: Improved aeration.  Partial clearing of pulmonary edema. Improvement in right lower lobe atelectasis.  Original Report Authenticated By: Truett Perna, M.D.   Dg Chest Port 1 View  03/06/2012  *RADIOLOGY REPORT*  Clinical Data: Shortness  of breath.  PORTABLE CHEST - 1 VIEW  Comparison: 03/06/2012 at 5:24 a.m.  Findings: Mildly increasing pulmonary vascular congestion and bilateral airspace opacities/edema noted. Bibasilar atelectasis and question of small bilateral pleural effusions noted. There is no evidence of pneumothorax. No acute bony abnormalities are identified.  IMPRESSION: Increasing bilateral airspace opacities/edema and pulmonary vascular congestion.  Bibasilar atelectasis with question of small bilateral pleural effusions.  Original Report Authenticated By: Lura Em, M.D.    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 SAH s/p ventriculostomy, pt pulled out this am. Remaining 3 aneurysms.  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 CT done this am. Will review with Dr. Estanislado Pandy and discuss with primary team.    LOS: 5 days    Ascencion Dike PA-C 03/08/2012 11:50 AM

## 2012-03-08 NOTE — Progress Notes (Signed)
[place] Progress Note Patient Name: [name] DOB: January 25, 1961 MRN: [id number]  Date of Service  03/08/2012   HPI/Events of Note   Tachycardia, apparently SVT vs sinus. No evidence pain or other acute changes. BP will tolerate attempt to rate control. Hesitant to give labetalol due to episode brady earlier 5/19.   eICU Interventions  Will try metoprolol 5mg  now and follow   Intervention Category Intermediate Interventions: Arrhythmia - evaluation and management  Ido Wollman S. 03/08/2012, 1:57 AM

## 2012-03-09 ENCOUNTER — Inpatient Hospital Stay (HOSPITAL_COMMUNITY): Payer: No Typology Code available for payment source

## 2012-03-09 DIAGNOSIS — I10 Essential (primary) hypertension: Secondary | ICD-10-CM

## 2012-03-09 LAB — TRIGLYCERIDES: Triglycerides: 171 mg/dL — ABNORMAL HIGH (ref <150)

## 2012-03-09 MED ORDER — NICARDIPINE HCL IN NACL 20-0.86 MG/200ML-% IV SOLN
5.0000 mg/h | INTRAVENOUS | Status: DC
  Administered 2012-03-09 – 2012-03-10 (×2): 5 mg/h via INTRAVENOUS
  Filled 2012-03-09 (×4): qty 200

## 2012-03-09 MED ORDER — LIDOCAINE HCL (CARDIAC) 20 MG/ML IV SOLN
INTRAVENOUS | Status: AC
  Filled 2012-03-09: qty 5

## 2012-03-09 MED ORDER — LIDOCAINE HCL (PF) 1 % IJ SOLN
INTRAMUSCULAR | Status: AC
  Administered 2012-03-09: 5 mL
  Filled 2012-03-09: qty 5

## 2012-03-09 MED ORDER — METOPROLOL TARTRATE 1 MG/ML IV SOLN
2.5000 mg | Freq: Four times a day (QID) | INTRAVENOUS | Status: DC
  Administered 2012-03-09 – 2012-03-11 (×7): 2.5 mg via INTRAVENOUS
  Filled 2012-03-09 (×8): qty 5

## 2012-03-09 NOTE — Progress Notes (Signed)
Nutrition Follow-up  Diet Order:  Regular  Pt admitted with nontraumatic SAH s/p ventriculostomy placement. Ventriculostomy pulled 5/19, pt did not tolerate and was replaced on 5/21. Pt with aneurism coiled on 5/16, however 3 remain.  Pt lethargic was not able to answer any questions without falling asleep. Pt with mittens on as she pulled her ventriculostomy tube on 5/19. Per RN pt has not been eating or drinking well. Tech feeding pt who has mittens on. Pt ate 50% this am.   Meds: Scheduled Meds:   . atropine  1 mg Intravenous Once  .  ceFAZolin (ANCEF) IV  1 g Intravenous Q12H  . cloNIDine  0.1 mg Oral Once  . cloNIDine  0.3 mg Oral BID  . hydrALAZINE  10 mg Intravenous Once  . hydrALAZINE  50 mg Oral Q8H  . levETIRAcetam  500 mg Oral Q12H  . lidocaine      . metoprolol  2.5 mg Intravenous Q6H  . nicotine  21 mg Transdermal Q24H  . niMODipine  60 mg Oral Q4H  . pantoprazole  40 mg Oral QHS  . senna-docusate  1 tablet Oral BID  . DISCONTD: albuterol  2.5 mg Nebulization Q6H  . DISCONTD: metoprolol  2.5 mg Intravenous Q6H   Continuous Infusions:   . sodium chloride 50 mL/hr (03/09/12 0358)  . niCARDipine Stopped (03/09/12 0300)   PRN Meds:.acetaminophen, acetaminophen, acetaminophen, hydrALAZINE, HYDROmorphone (DILAUDID) injection, labetalol, morphine injection, ondansetron (ZOFRAN) IV  Labs:  CMP     Component Value Date/Time   NA 137 03/08/2012 0425   K 3.8 03/08/2012 0425   CL 105 03/08/2012 0425   CO2 18* 03/08/2012 0425   GLUCOSE 121* 03/08/2012 0425   BUN 22 03/08/2012 0425   CREATININE 1.84* 03/08/2012 0425   CALCIUM 9.1 03/08/2012 0425   PROT 7.0 03/08/2012 0425   ALBUMIN 2.8* 03/08/2012 0425   AST 10 03/08/2012 0425   ALT <5 03/08/2012 0425   ALKPHOS 74 03/08/2012 0425   BILITOT 0.3 03/08/2012 0425   GFRNONAA 31* 03/08/2012 0425   GFRAA 36* 03/08/2012 0425   CBG (last 3)   Basename 03/07/12 1129 03/07/12 0734 03/07/12 0412  GLUCAP 136* 117* 118*    Intake/Output  Summary (Last 24 hours) at 03/09/12 1034 Last data filed at 03/09/12 1000  Gross per 24 hour  Intake   1570 ml  Output   3193 ml  Net  -1623 ml    Weight Status:   181 lbs 5/20 179 lbs 5/19  Re-estimated needs:  Kcal: 1700-1900; Protein: 80-90 grams; Fluid: >1.7 L/day  Nutrition Dx:  Inadequate oral intake now r/t lethargy AEB intake of < 50% of her meals.   Goal: Provide >/= 90% of her estimated needs  Intervention:    Magic cup with meals  Monitor:  Po intake, weight   [name], [place] Pager #:  [phone]

## 2012-03-09 NOTE — Progress Notes (Signed)
Name: [name] MRN: [id number] DOB: April 25, 1961    LOS: 6  PULMONARY / CRITICAL CARE MEDICINE  Brief patient description:  51 yo with history of HTN and previous intraparenchymal hemorrhage who presented to [facility] ED on 5/15 hypertensive with nasuea, vomiting, headache and was found to have [place]. Intubated.  Ventriculostomy placed.  Events Since Admission: 5/15  Presented to [facility] ED hypertensive with nasuea, vomiting, headache and was found to have [place]. Intubated.  Ventriculostomy placed. 5/16  Extubated successfully 5/17  Some hypoxia, off nicardipine drip now 5/18  Variable O2 needs, pcxr edema, tachy, trop neg 5/19  Pulled out ventriculostomy catheter, episodes of tachycardia 5/20  Briefly required Cardene gtt for BP control, ventricular drain placed back  Interval history:  Briefly required Cardene gtt for BP control, ventricular drain placed back  Vital Signs: Temp:  [99.2 F (37.3 C)-99.9 F (37.7 C)] 99.2 F (37.3 C) (05/21 0700) Pulse Rate:  [58-76] 60  (05/21 0800) Resp:  [15-31] 16  (05/21 0800) BP: (134-211)/(70-99) 144/70 mmHg (05/21 0800) SpO2:  [93 %-99 %] 98 % (05/21 0800)  Physical Examination: General:  Comfortable Neuro:  Cooperative HEENT:  PERRL, ventricular drain in place Neck: No JVD Cardiovascular:  RRR, no M/R/G Lungs:  Bilateral air entry, no added sounds Abdomen:  Soft, nontender, nondistended, bowel sounds present Musculoskeletal:  Moves all extremities, no pedal edema Skin:  No rash  Principal Problem:  *SAH (subarachnoid hemorrhage) Active Problems:  Hypertensive emergency  Acute respiratory failure  HYPERLIPIDEMIA-MIXED  CAD, NATIVE VESSEL  DIASTOLIC HEART FAILURE, CHRONIC  CHRONIC KIDNEY DISEASE UNSPECIFIED  ASSESSMENT AND PLAN  PULMONARY  Lab 03/07/12 0505 03/06/12 1040 03/05/12 2007 03/03/12 2227  PHART 7.429* 7.419* 7.414* 7.313*  PCO2ART 29.8* 26.1* 30.1* 42.3  PO2ART 83.3 57.6* 81.7 348.0*  HCO3 19.4* 16.5* 18.8* 20.9  O2SAT  97.3 89.9 96.3 99.8   Ventilator Settings:    CXR:  No new. ETT:  5/15 >>>5/16  A:  Acute respiratory failure - resolved. Several episodes of pulmonary edema on the background of diastolic CHF and IVF. Now minimal oxygen requirements.  No evidence of bronchospasm. P:   - Supplemental oxygen, goal SpO2 > 92% - Incentive spirometry  CARDIOVASCULAR  Lab 03/08/12 0425 03/07/12 2205 03/07/12 1818 03/07/12 0319 03/06/12 2130  TROPONINI <0.30 <0.30 <0.30 <0.30 <0.30  LATICACIDVEN -- -- -- -- --  PROBNP -- -- -- 7892.0* --   ECG:  NA Lines: NA TTE:  5/18 >>> Grade 2 diastolic disease, IVC dilated , EF 55%  A: Hypertensive emergency - resolved.  Transiently hypertensive overnight - likely driven by increase ICP.  History of HTN, CAD, CHF (diastolic). SVT / sinus tachycardia. P:  - Clonidine, hydralazine - Goal SBP < 180 and DBP < 110 - Add Metoprolol 2.5 mg IV q6h as tachycardic and diastolic CHF, hold for HR < 50  RENAL  Lab 03/08/12 0425 03/07/12 0318 03/06/12 0428 03/05/12 0645 03/05/12 0051  NA 137 140 143 143 142  K 3.8 3.7 -- -- --  CL 105 110 114* 112 110  CO2 18* 19 19 21 19   BUN 22 13 12 12 11   CREATININE 1.84* 1.65* 1.80* 1.89* 1.83*  CALCIUM 9.1 8.8 8.5 8.5 8.7  MG -- -- -- 1.7 --  PHOS -- -- -- 4.4 --   Intake/Output      05/20 0701 - 05/21 0700 05/21 0701 - 05/22 0700   P.O. 240 120   I.V. (mL/kg) 1200 (14.6) 50 (0.6)   IV  Piggyback 250    Total Intake(mL/kg) 1690 (20.6) 170 (2.1)   Urine (mL/kg/hr) 3610 (1.8) 150   Drains 23 22   Total Output 3633 172   Net -1943 -2         Foley:  5/15 >>>  A:  Acute on chronic renal failure.   P:   - NS 50 mL/h - No florinef with heart failure - BMP AM  GASTROINTESTINAL  Lab 03/08/12 0425 03/07/12 0318 03/06/12 0428  AST 10 9 11   ALT <5 <5 <5  ALKPHOS 74 74 80  BILITOT 0.3 0.3 0.3  PROT 7.0 6.3 6.0  ALBUMIN 2.8* 2.5* 2.5*   A:  No active issues P:   -  No intervention required -   Diet  HEMATOLOGIC  Lab 03/08/12 0425 03/07/12 0318 03/06/12 0428 03/05/12 0645 03/05/12 0051 03/03/12 1830  HGB 12.5 11.8* 11.3* 12.1 12.9 --  HCT 37.0 35.1* 34.3* 36.9 38.4 --  PLT 197 177 194 182 215 --  INR -- -- -- -- -- 0.98  APTT -- -- -- -- -- 30   A:  No active issues P:  -  No intervention required -  CBC AM  INFECTIOUS  Lab 03/08/12 0425 03/07/12 0318 03/06/12 0428 03/05/12 0645 03/05/12 0051  WBC 9.9 8.8 11.2* 8.5 10.5  PROCALCITON -- -- -- -- --   Cultures: NA Antibiotics: Ancef 5/15  >>>  A:  No evidence of infection.  Fever, likely CNS origin.  P:   - Monitor fever curve - ABx per neurosurgery  ENDOCRINE  Lab 03/07/12 1129 03/07/12 0734 03/07/12 0412 03/07/12 03/06/12 2021  GLUCAP 136* 117* 118* 118* 162*   A:  Hyperglycemia, resolved P:   -  No intervention required  NEUROLOGIC  Head CT:   5/16 >>> Diffuse SAH with ventriculomegaly 5/20 >>> Normal size ventricles 5/21 >>> Residual intraventricular hemorrhage, redevelopment of hydrocephalus  Angio: 5/16 >>> Aneurism coiled, 3 remain TCD: 5/20 >>>  A:  SAH, nontraumatic, s/p ventriculostomy (replaced 5/21).  Hydrocephalus. P:   - Neurosurgery follows - TCD - Ventriculostomy  - Keppra - Nimodipine  BEST PRACTICE / DISPOSITION - Level of Care:  ICU - Primary Service:  Neurosurgery - Consultants:  PCCM - Code Status:  Full - Diet:  Regular diet - DVT Px:  SCDs - GI Px:  Protonix - Skin Integrity:  No issues - Social / Family:  Family is not available  Doree Fudge, M.D. Pulmonary and [place] Pager: [phone]  03/09/2012, 9:16 AM

## 2012-03-09 NOTE — Procedures (Signed)
Preprocedure diagnosis: Hydrocephalus  Post procedure diagnosis: Hydrocephalus  Procedure: External ventricular drain placement   anesthesia: L  History of present illness: Patient is a 51 year old female who is admitted with a subarachnoid hemorrhage and multiple aneurysms. She had basilar tip aneurysm coiled and she had a ventricular drain placed her for 5 days and she pulled out herself this morning. Throughout the evening she had progressive difficulty with blood pressure and now worsening of her headache followup scan show progression of her hydrocephalus and that dilatation of her ventricles and I've her commended placement of a extraventricular drain through the previously drilled a right frontal cooker bur hole. Risks and benefits of the procedure spine the patient she understood and agreed to proceed for plus it was emergent.  Operative procedure: Her right overhead which previously shaved was prepped and draped in sterile fashion her stitches and her staple removed using the previously drilled right frontal twist to site a catheter was placed through copers point into the ventricle immediately get CSF under pressure on the first pass this was then tunneled and sewed in place and hooked up to an external collecting system. Patient tolerated procedure well was awake alert before during and after. At the R. counts and sponge counts were correct.

## 2012-03-09 NOTE — Progress Notes (Signed)
BP 172/82  Pulse 71  Temp(Src) 102.8 F (39.3 C) (Oral)  Resp 23  Ht 5\' 1"  (1.549 m)  Wt 82.2 kg (181 lb 3.5 oz)  BMI 34.24 kg/m2  SpO2 97% Alert, oriented x 2, did not know she was at . Follows commands Perrl, full eom Tongue and uvula midline Symmetric facial movement Hearing intact to voice. Moving all extremities. ventric is draining well, csf is still blood tinged.

## 2012-03-10 ENCOUNTER — Encounter (HOSPITAL_COMMUNITY): Payer: Self-pay

## 2012-03-10 DIAGNOSIS — I609 Nontraumatic subarachnoid hemorrhage, unspecified: Secondary | ICD-10-CM

## 2012-03-10 LAB — CBC
Platelets: 230 10*3/uL (ref 150–400)
RBC: 4.3 MIL/uL (ref 3.87–5.11)
WBC: 9 10*3/uL (ref 4.0–10.5)

## 2012-03-10 LAB — BASIC METABOLIC PANEL
CO2: 17 mEq/L — ABNORMAL LOW (ref 19–32)
Calcium: 8.8 mg/dL (ref 8.4–10.5)
Chloride: 104 mEq/L (ref 96–112)
Potassium: 3.7 mEq/L (ref 3.5–5.1)
Sodium: 135 mEq/L (ref 135–145)

## 2012-03-10 NOTE — Progress Notes (Signed)
Patient ID: [name], female   DOB: 1960/11/17, 51 y.o.   MRN: [id number] BP 199/89  Pulse 66  Temp(Src) 100.5 F (38.1 C) (Oral)  Resp 23  Ht 5\' 1"  (1.549 m)  Wt 82.2 kg (181 lb 3.5 oz)  BMI 34.24 kg/m2  SpO2 100% Alert, following commands. Perrl, full eom Symmetric facies Tongue and uvula midline Moving all extremities Ventricular catheter draining well. Continue current measures.

## 2012-03-10 NOTE — Progress Notes (Signed)
TCD completed and reported with results from 03-08-12.

## 2012-03-10 NOTE — Progress Notes (Signed)
Name: [name] MRN: [id number] DOB: 05-02-1961    LOS: 7  PULMONARY / CRITICAL CARE MEDICINE  Brief patient description:  51 yo with history of HTN and previous intraparenchymal hemorrhage who presented to [facility] ED on 5/15 hypertensive with nasuea, vomiting, headache and was found to have [place]. Intubated.  Ventriculostomy placed.  Events Since Admission: 5/15  Presented to [facility] ED hypertensive with nasuea, vomiting, headache and was found to have [place]. Intubated.  Ventriculostomy placed. 5/16  Extubated successfully 5/17  Some hypoxia, off nicardipine drip now 5/18  Variable O2 needs, pcxr edema, tachy, trop neg 5/19  Pulled out ventriculostomy catheter, episodes of tachycardia 5/20  Briefly required Cardene gtt for BP control, ventricular drain placed back  Interval history:  Intermittently confused.  BP stable overnight,   Vital Signs: Temp:  [98.6 F (37 C)-102.8 F (39.3 C)] 99.6 F (37.6 C) (05/22 0600) Pulse Rate:  [55-76] 61  (05/22 0700) Resp:  [13-30] 16  (05/22 0700) BP: (127-196)/(69-86) 152/71 mmHg (05/22 0700) SpO2:  [96 %-100 %] 100 % (05/22 0700)  Physical Examination: General:  Sleepy but arouses to voice Neuro:  Nonfocal HEENT:  Sanguinous CSF via ventriculostomy Neck: No JVD Cardiovascular:  RRR, no M/R/G Lungs:  Bilateral air entry, no w/r/r Abdomen:  Soft, nontender, nondistended, bowel sounds present Musculoskeletal:  Moves all extremities, no pedal edema Skin:  No rash  Principal Problem:  *SAH (subarachnoid hemorrhage) Active Problems:  Hypertensive emergency  Acute respiratory failure  HYPERLIPIDEMIA-MIXED  CAD, NATIVE VESSEL  DIASTOLIC HEART FAILURE, CHRONIC  CHRONIC KIDNEY DISEASE UNSPECIFIED  ASSESSMENT AND PLAN  PULMONARY  Lab 03/07/12 0505 03/06/12 1040 03/05/12 2007 03/03/12 2227  PHART 7.429* 7.419* 7.414* 7.313*  PCO2ART 29.8* 26.1* 30.1* 42.3  PO2ART 83.3 57.6* 81.7 348.0*  HCO3 19.4* 16.5* 18.8* 20.9  O2SAT 97.3 89.9 96.3 99.8     Ventilator Settings:    CXR:  No new. ETT:  5/15 >>>5/16  A:  Acute respiratory failure - resolved. Several episodes of pulmonary edema on the background of diastolic CHF and IVF. Now minimal oxygen requirements.  No evidence of bronchospasm. P:   - Supplemental oxygen, goal SpO2 > 92% - Incentive spirometry  CARDIOVASCULAR  Lab 03/08/12 0425 03/07/12 2205 03/07/12 1818 03/07/12 0319 03/06/12 2130  TROPONINI <0.30 <0.30 <0.30 <0.30 <0.30  LATICACIDVEN -- -- -- -- --  PROBNP -- -- -- 7892.0* --   ECG:  NA Lines: NA TTE:  5/18 >>> Grade 2 diastolic disease, IVC dilated , EF 55%  A: Hypertensive emergency - resolved.  Transiently hypertensive overnight - likely driven by increase ICP.  History of HTN, CAD, CHF (diastolic). SVT / sinus tachycardia. P:  - Clonidine, hydralazine - Goal SBP < 180 and DBP < 110 - Metoprolol 2.5 mg IV q6h as tachycardic and diastolic CHF, hold for HR < 50  RENAL  Lab 03/10/12 0343 03/08/12 0425 03/07/12 0318 03/06/12 0428 03/05/12 0645  NA 135 137 140 143 143  K 3.7 3.8 -- -- --  CL 104 105 110 114* 112  CO2 17* 18* 19 19 21   BUN 29* 22 13 12 12   CREATININE 1.65* 1.84* 1.65* 1.80* 1.89*  CALCIUM 8.8 9.1 8.8 8.5 8.5  MG -- -- -- -- 1.7  PHOS -- -- -- -- 4.4   Intake/Output      05/21 0701 - 05/22 0700 05/22 0701 - 05/23 0700   P.O. 540    I.V. (mL/kg) 1150 (14)    IV Piggyback 100  Total Intake(mL/kg) 1790 (21.8)    Urine (mL/kg/hr) 2213 (1.1)    Drains 319    Total Output 2532    Net -742          Foley:  5/15 >>>  A:  Acute on chronic renal failure, stable.  Mild hypokalemia. P:   - NS 50 mL/h - BMP in AM  GASTROINTESTINAL  Lab 03/08/12 0425 03/07/12 0318 03/06/12 0428  AST 10 9 11   ALT <5 <5 <5  ALKPHOS 74 74 80  BILITOT 0.3 0.3 0.3  PROT 7.0 6.3 6.0  ALBUMIN 2.8* 2.5* 2.5*   A:  No active issues P:   -  No intervention required -  Diet  HEMATOLOGIC  Lab 03/10/12 0343 03/08/12 0425 03/07/12 0318 03/06/12  0428 03/05/12 0645 03/03/12 1830  HGB 12.5 12.5 11.8* 11.3* 12.1 --  HCT 37.3 37.0 35.1* 34.3* 36.9 --  PLT 230 197 177 194 182 --  INR -- -- -- -- -- 0.98  APTT -- -- -- -- -- 30   A:  No active issues P:  -  No intervention required  INFECTIOUS  Lab 03/10/12 0343 03/08/12 0425 03/07/12 0318 03/06/12 0428 03/05/12 0645  WBC 9.0 9.9 8.8 11.2* 8.5  PROCALCITON -- -- -- -- --   Cultures: NA Antibiotics: Ancef 5/15  >>>  A:  No evidence of infection.  Fever, likely CNS origin.  P:   - Monitor fever curve - ABx per neurosurgery  ENDOCRINE  Lab 03/07/12 1129 03/07/12 0734 03/07/12 0412 03/07/12 03/06/12 2021  GLUCAP 136* 117* 118* 118* 162*   A:  Hyperglycemia, resolved P:   -  No intervention required  NEUROLOGIC  Head CT:   5/16 >>> Diffuse SAH with ventriculomegaly 5/20 >>> Normal size ventricles 5/21 >>> Residual intraventricular hemorrhage, redevelopment of hydrocephalus  Angio: 5/16 >>> Aneurism coiled, 3 remain TCD: 5/20 >>> Mild vasospasm, elevated ICP  A:  SAH, nontraumatic, s/p ventriculostomy (replaced 5/21).  Hydrocephalus. P:   - Neurosurgery follows - TCD - Ventriculostomy  - Keppra - Nimodipine  BEST PRACTICE / DISPOSITION - Level of Care:  ICU - Primary Service:  Neurosurgery - Consultants:  PCCM - Code Status:  Full - Diet:  Regular diet - DVT Px:  SCDs - GI Px:  Protonix - Skin Integrity:  No issues - Social / Family:  Family is not available  Doree Fudge, M.D. Pulmonary and [name] Pager: [phone]  03/10/2012, 7:04 AM

## 2012-03-10 NOTE — Progress Notes (Signed)
Subjective: Pt doing well; no new c/o  Objective: Vital signs in last 24 hours: Temp:  [98.6 F (37 C)-102.8 F (39.3 C)] 99.2 F (37.3 C) (05/22 0700) Pulse Rate:  [55-76] 65  (05/22 1000) Resp:  [13-26] 22  (05/22 1000) BP: (138-196)/(69-87) 189/86 mmHg (05/22 1000) SpO2:  [96 %-100 %] 100 % (05/22 1000)    Intake/Output from previous day: 05/21 0701 - 05/22 0700 In: [place] [P.O.:540; I.V.:1200; IV Piggyback:100] Out: 2538 [Urine:2213; Drains:325] Intake/Output this shift: Total I/O In: 270 [P.O.:120; I.V.:150] Out: 85 [Urine:65; Drains:20]  Pt awake/alert; speech nl, tongue midline, face symm, PERRL, moving all four ext well, rt groin soft, NT; light yellow CSF in IVC  Lab Results:   Basename 03/10/12 0343 03/08/12 0425  WBC 9.0 9.9  HGB 12.5 12.5  HCT 37.3 37.0  PLT 230 197   BMET  Basename 03/10/12 0343 03/08/12 0425  NA 135 137  K 3.7 3.8  CL 104 105  CO2 17* 18*  GLUCOSE 101* 121*  BUN 29* 22  CREATININE 1.65* 1.84*  CALCIUM 8.8 9.1   PT/INR No results found for this basename: LABPROT:2,INR:2 in the last 72 hours ABG No results found for this basename: PHART:2,PCO2:2,PO2:2,HCO3:2 in the last 72 hours  Studies/Results: Ct Head Wo Contrast  03/09/2012  *RADIOLOGY REPORT*  Clinical Data: Acute hypertension and headaches today, 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  CT HEAD WITHOUT CONTRAST  Technique:  Contiguous axial images were obtained from the base of the skull through the vertex without contrast.  Comparison: Unenhanced cranial CT yesterday, 03/04/2012, 03/03/2012.  Findings: Metallic beam hardening streak artifact from the coils in the basilar tip aneurysm.  Low attenuation along the tract of the previous ventriculostomy catheter in the right frontal lobe, likely representing gliosis.  Interval enlargement of the lateral and third ventricles since yesterday, with marked enlargement of the temporal horns of the lateral  ventricles.  Residual intraventricular hemorrhage layering posteriorly in the occipital horns and in the frontal horns.  Loss of all of the cortical sulci throughout both cerebral hemispheres.  No midline shift.  No new/acute hemorrhage or hematoma.  Interval resorption of essentially all of the subarachnoid blood.  Right frontotemporal craniotomy with clipping of 2 additional aneurysms on the right.  Visualized paranasal sinuses, mastoid air cells, and middle ear cavities well-aerated.  IMPRESSION:  1.  Redevelopment of hydrocephalus since yesterday. 2.  Residual intraventricular hemorrhage.  Near complete resolution of the subarachnoid hemorrhage. 3.  Increased intracranial pressure suspected, as there is complete loss of the cortical sulci in both cerebral hemispheres. No midline shift.  These results were called by telephone on 03/09/2012  at  [place] hours to  Beverlee Nims, the nurse caring for the patient in the neuro ICU, who verbally acknowledged these results.  Original Report Authenticated By: Deniece Portela, M.D.    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

## 2012-03-11 LAB — BASIC METABOLIC PANEL
GFR calc non Af Amer: 37 mL/min — ABNORMAL LOW (ref >90)
Glucose, Bld: 112 mg/dL — ABNORMAL HIGH (ref 70–99)
Potassium: 3.6 mEq/L (ref 3.5–5.1)
Sodium: 137 mEq/L (ref 135–145)

## 2012-03-11 MED ORDER — ENSURE COMPLETE PO LIQD
237.0000 mL | Freq: Two times a day (BID) | ORAL | Status: DC
  Administered 2012-03-11 – 2012-03-16 (×7): 237 mL via ORAL
  Administered 2012-03-17: 75 mL via ORAL
  Filled 2012-03-11 (×3): qty 237

## 2012-03-11 MED ORDER — LABETALOL HCL 100 MG PO TABS
100.0000 mg | ORAL_TABLET | Freq: Two times a day (BID) | ORAL | Status: DC
  Administered 2012-03-11 (×2): 100 mg via ORAL
  Filled 2012-03-11 (×4): qty 1

## 2012-03-11 MED ORDER — NICARDIPINE HCL IN NACL 40-0.83 MG/200ML-% IV SOLN
5.0000 mg/h | INTRAVENOUS | Status: DC
  Administered 2012-03-11 (×3): 5 mg/h via INTRAVENOUS
  Administered 2012-03-11: 15 mg/h via INTRAVENOUS
  Administered 2012-03-11 – 2012-03-12 (×4): 5 mg/h via INTRAVENOUS
  Filled 2012-03-11 (×9): qty 200

## 2012-03-11 MED ORDER — CLONIDINE HCL 0.3 MG PO TABS
0.3000 mg | ORAL_TABLET | Freq: Three times a day (TID) | ORAL | Status: DC
  Administered 2012-03-11 – 2012-03-24 (×36): 0.3 mg via ORAL
  Administered 2012-03-25: 0.15 mg via ORAL
  Administered 2012-03-26 – 2012-04-05 (×29): 0.3 mg via ORAL
  Filled 2012-03-11 (×78): qty 1

## 2012-03-11 NOTE — Plan of Care (Signed)
Problem: Phase I Progression Outcomes Goal: Optimized method of communication. Outcome: Progressing 5/23: Pt is able to express needs verbally. Goal: Voiding-avoid urinary catheter unless indicated Outcome: Completed/Met Date Met:  03/11/12 Urinary catheter has been removed and patient is voiding in bed pan and sometimes incontinent.

## 2012-03-11 NOTE — Plan of Care (Signed)
Problem: Phase I Progression Outcomes Goal: Hemodynamically stable Outcome: Progressing 5/23: Pt requires nicardipine in addition to her home medications at this time. Attempting to wean nicardipine. Goal: Patient tolerating nututrition at goal Outcome: Progressing 5/23: Pt appetite is fair. Still remains lethargic. Goal: Patient tolerating weaning plan Outcome: Completed/Met Date Met:  03/11/12 Pt weaned and extubated on 5/15

## 2012-03-11 NOTE — Progress Notes (Signed)
Patient ID: [name], female   DOB: 07-19-1961, 51 y.o.   MRN: [id number] BP 135/61  Pulse 73  Temp(Src) 100.8 F (38.2 C) (Oral)  Resp 24  Ht 5\' 1"  (1.549 m)  Wt 82.2 kg (181 lb 3.5 oz)  BMI 34.24 kg/m2  SpO2 100% Alert oriented x 3. Following all commands.  Perrl, full eom Symmetric facies Tongue and uvula midline Moving all extremities well Ventricular catheter draining well. Fluid is still blood tinged.

## 2012-03-11 NOTE — Progress Notes (Signed)
Name: [name] MRN: [id number] DOB: 02-18-1961    LOS: 8  PULMONARY / CRITICAL CARE MEDICINE  Brief patient description:  51 yo with history of HTN and previous intraparenchymal hemorrhage who presented to [facility] ED on 5/15 hypertensive with nasuea, vomiting, headache and was found to have [place]. Intubated.  Ventriculostomy placed.  Events Since Admission: 5/15  Presented to [facility] ED hypertensive with nasuea, vomiting, headache and was found to have [place]. Intubated.  Ventriculostomy placed. 5/16  Extubated successfully 5/17  Some hypoxia, off nicardipine drip now 5/18  Variable O2 needs, pcxr edema, tachy, trop neg 5/19  Pulled out ventriculostomy catheter, episodes of tachycardia 5/20  Briefly required Cardene gtt for BP control, ventricular drain placed back  Interval history:  Hypertensive requiring Cardene gtt.  Vital Signs: Temp:  [99.5 F (37.5 C)-101.8 F (38.8 C)] 100.7 F (38.2 C) (05/23 0752) Pulse Rate:  [60-78] 74  (05/23 0700) Resp:  [18-29] 29  (05/23 0700) BP: (152-224)/(73-99) 205/94 mmHg (05/23 0752) SpO2:  [99 %-100 %] 100 % (05/23 0700)  Physical Examination: General:  No distress Neuro:  Nonfocal HEENT:  Ventriculostomy in place, draining well Neck: No JVD Cardiovascular:  RRR, no murmurs Lungs:  Bilateral air entry, no w/r/r Abdomen:  Soft, nontender, nondistended, bowel sounds present Musculoskeletal:  Moves all extremities, no pedal edema Skin:  No rash  Principal Problem:  *SAH (subarachnoid hemorrhage) Active Problems:  Hypertensive emergency  Acute respiratory failure  HYPERLIPIDEMIA-MIXED  CAD, NATIVE VESSEL  DIASTOLIC HEART FAILURE, CHRONIC  CHRONIC KIDNEY DISEASE UNSPECIFIED  ASSESSMENT AND PLAN  PULMONARY  Lab 03/07/12 0505 03/06/12 1040 03/05/12 2007  PHART 7.429* 7.419* 7.414*  PCO2ART 29.8* 26.1* 30.1*  PO2ART 83.3 57.6* 81.7  HCO3 19.4* 16.5* 18.8*  O2SAT 97.3 89.9 96.3   Ventilator Settings:    CXR:  No new. ETT:  5/15  >>>5/16  A:  Acute respiratory failure - resolved. Several episodes of acute pulmonary edema on the background of diastolic CHF and IVF. Now minimal oxygen requirements.  No evidence of bronchospasm. P:   - Supplemental oxygen, goal SpO2 > 92% - Incentive spirometry  CARDIOVASCULAR  Lab 03/08/12 0425 03/07/12 2205 03/07/12 1818 03/07/12 0319 03/06/12 2130  TROPONINI <0.30 <0.30 <0.30 <0.30 <0.30  LATICACIDVEN -- -- -- -- --  PROBNP -- -- -- 7892.0* --   ECG:  NA Lines: NA TTE:  5/18 >>> Grade 2 diastolic disease, IVC dilated , EF 55%  A: Hypertensive emergency - resolved.  Transiently hypertensive.  History of HTN, CAD, CHF (diastolic). SVT / sinus tachycardia. P:  - Continue Cardene gtt - Increase Clonidine to 0.3 mg PO q8h - D/c Metoprolol IV - Start Labetalol 100 mg PO q12h, hold for HR < 50 - Hydralazine 50 mg PO q8h - Goal SBP < 180 and DBP < 110  RENAL  Lab 03/11/12 0345 03/10/12 0343 03/08/12 0425 03/07/12 0318 03/06/12 0428 03/05/12 0645  NA 137 135 137 140 143 --  K 3.6 3.7 -- -- -- --  CL 106 104 105 110 114* --  CO2 17* 17* 18* 19 19 --  BUN 28* 29* 22 13 12  --  CREATININE 1.61* 1.65* 1.84* 1.65* 1.80* --  CALCIUM 8.9 8.8 9.1 8.8 8.5 --  MG -- -- -- -- -- 1.7  PHOS -- -- -- -- -- 4.4   Intake/Output      05/22 0701 - 05/23 0700 05/23 0701 - 05/24 0700   P.O. 540    I.V. (mL/kg) 1349.2 (16.4)  8.3 (0.1)   IV Piggyback 50    Total Intake(mL/kg) 1939.2 (23.6) 8.3 (0.1)   Urine (mL/kg/hr) 1715 (0.9) 0   Drains 280 15   Total Output 1995 15   Net -55.8 -6.7        Urine Occurrence  1 x    Foley:  5/15 >>>  A:  Acute on chronic renal failure, stable.  Mild hypokalemia. P:   - NS 50 mL/h - BMP in AM  GASTROINTESTINAL  Lab 03/08/12 0425 03/07/12 0318 03/06/12 0428  AST 10 9 11   ALT <5 <5 <5  ALKPHOS 74 74 80  BILITOT 0.3 0.3 0.3  PROT 7.0 6.3 6.0  ALBUMIN 2.8* 2.5* 2.5*   A:  No active issues P:   -  No intervention required -   Diet  HEMATOLOGIC  Lab 03/10/12 0343 03/08/12 0425 03/07/12 0318 03/06/12 0428 03/05/12 0645  HGB 12.5 12.5 11.8* 11.3* 12.1  HCT 37.3 37.0 35.1* 34.3* 36.9  PLT 230 197 177 194 182  INR -- -- -- -- --  APTT -- -- -- -- --   A:  No active issues P:  -  No intervention required  INFECTIOUS  Lab 03/10/12 0343 03/08/12 0425 03/07/12 0318 03/06/12 0428 03/05/12 0645  WBC 9.0 9.9 8.8 11.2* 8.5  PROCALCITON -- -- -- -- --   Cultures: NA Antibiotics: Ancef 5/15  >>>  A:  No evidence of infection.  Fever, likely CNS origin.  P:   - Monitor fever curve - ABx per neurosurgery  ENDOCRINE  Lab 03/07/12 1129 03/07/12 0734 03/07/12 0412 03/07/12 03/06/12 2021  GLUCAP 136* 117* 118* 118* 162*   A:  Hyperglycemia, resolved P:   -  No intervention required  NEUROLOGIC  Head CT:   5/16 >>> Diffuse SAH with ventriculomegaly 5/20 >>> Normal size ventricles 5/21 >>> Residual intraventricular hemorrhage, redevelopment of hydrocephalus  Angio: 5/16 >>> Aneurism coiled, 3 remain TCD: 5/20 >>> Mild vasospasm, elevated ICP  A:  SAH, nontraumatic, s/p ventriculostomy (replaced 5/21).  Hydrocephalus. P:   - Neurosurgery follows - TCD - Ventriculostomy  - Keppra - Nimodipine  BEST PRACTICE / DISPOSITION - Level of Care:  ICU - Primary Service:  Neurosurgery - Consultants:  PCCM - Code Status:  Full - Diet:  Regular diet - DVT Px:  SCDs - GI Px:  Protonix - Skin Integrity:  No issues - Social / Family:  Family is not available  Doree Fudge, M.D. Pulmonary and [place] Pager: [phone]  03/11/2012, 8:54 AM

## 2012-03-11 NOTE — Progress Notes (Signed)
Nutrition Follow-up  Diet Order:  Regular Family at bedside. Pt more awake today than on previous visits. Pt reports her appetite is poor, but does state she thinks it is improving. Pt agreeable to trying ensure and liked it and is willing to drink in the future.   Meds: Scheduled Meds:   . atropine  1 mg Intravenous Once  .  ceFAZolin (ANCEF) IV  1 g Intravenous Q12H  . cloNIDine  0.3 mg Oral Q8H  . hydrALAZINE  50 mg Oral Q8H  . labetalol  100 mg Oral Q12H  . levETIRAcetam  500 mg Oral Q12H  . nicotine  21 mg Transdermal Q24H  . niMODipine  60 mg Oral Q4H  . pantoprazole  40 mg Oral QHS  . senna-docusate  1 tablet Oral BID  . DISCONTD: cloNIDine  0.3 mg Oral BID  . DISCONTD: hydrALAZINE  10 mg Intravenous Once  . DISCONTD: metoprolol  2.5 mg Intravenous Q6H   Continuous Infusions:   . sodium chloride 50 mL/hr at 03/11/12 0600  . niCARDipine 5 mg/hr (03/11/12 1400)  . DISCONTD: niCARDipine 15 mg/hr (03/11/12 0705)   PRN Meds:.acetaminophen, acetaminophen, acetaminophen, morphine injection, ondansetron (ZOFRAN) IV, DISCONTD: hydrALAZINE, DISCONTD:  HYDROmorphone (DILAUDID) injection, DISCONTD: labetalol  Labs:  CMP     Component Value Date/Time   NA 137 03/11/2012 0345   K 3.6 03/11/2012 0345   CL 106 03/11/2012 0345   CO2 17* 03/11/2012 0345   GLUCOSE 112* 03/11/2012 0345   BUN 28* 03/11/2012 0345   CREATININE 1.61* 03/11/2012 0345   CALCIUM 8.9 03/11/2012 0345   PROT 7.0 03/08/2012 0425   ALBUMIN 2.8* 03/08/2012 0425   AST 10 03/08/2012 0425   ALT <5 03/08/2012 0425   ALKPHOS 74 03/08/2012 0425   BILITOT 0.3 03/08/2012 0425   GFRNONAA 37* 03/11/2012 0345   GFRAA 42* 03/11/2012 0345  CBG (last 3)  No results found for this basename: GLUCAP:3 in the last 72 hours   Intake/Output Summary (Last 24 hours) at 03/11/12 1420 Last data filed at 03/11/12 1400  Gross per 24 hour  Intake 1675.82 ml  Output   2104 ml  Net -428.18 ml    Weight Status:  181 lbs 5/20  Estimated needs:  Kcal: 1700-1900; Protein: 80-90 grams; Fluid: >1.7 L/day   Nutrition Dx: Inadequate oral intake now r/t lethargy AEB intake of < 50% of her meals; ongoing  Goal: Provide >/= 90% of her estimated needs; not met.  Intervention:  Continue Magic cup with meals Ensure Complete BID, chocolate  Monitor: Po intake, weight  Melquiades Kovar, [place] Pager #:  [phone]

## 2012-03-12 MED ORDER — DEXTROSE 5 % IV SOLN
5.0000 mg/h | INTRAVENOUS | Status: DC
  Administered 2012-03-12 (×2): 10 mg/h via INTRAVENOUS
  Administered 2012-03-14: 15 mg/h via INTRAVENOUS
  Filled 2012-03-12 (×5): qty 100

## 2012-03-12 MED ORDER — DILTIAZEM LOAD VIA INFUSION
15.0000 mg | Freq: Once | INTRAVENOUS | Status: AC
  Administered 2012-03-12: 15 mg via INTRAVENOUS
  Filled 2012-03-12: qty 15

## 2012-03-12 MED ORDER — LABETALOL HCL 200 MG PO TABS
200.0000 mg | ORAL_TABLET | Freq: Two times a day (BID) | ORAL | Status: DC
  Administered 2012-03-12 – 2012-03-16 (×8): 200 mg via ORAL
  Filled 2012-03-12 (×10): qty 1

## 2012-03-12 NOTE — Progress Notes (Signed)
Patient ID: [name], female   DOB: 11-23-1960, 51 y.o.   MRN: [id number] BP 149/65  Pulse 79  Temp(Src) 102.2 F (39 C) (Oral)  Resp 25  Ht 5\' 1"  (1.549 m)  Wt 81.4 kg (179 lb 7.3 oz)  BMI 33.91 kg/m2  SpO2 100% Alert, oriented x 3, follows commands. Perrl, full eom Symmetric facies Tongue And uvula midline Moves upper and lower extremities BP still requiring cardene in addition to her oral meds.  Neuro exam is stable.

## 2012-03-12 NOTE — Progress Notes (Signed)
eLink Physician-Brief Progress Note Patient Name: [name] DOB: [birth year] MRN: [id number]  Date of Service  03/12/2012   HPI/Events of Note  Pt unable to void due to bladder retention  eICU Interventions  Will replace foley   Intervention Category Minor Interventions: Routine modifications to care plan (e.g. PRN medications for pain, fever)  Asencion Noble 03/12/2012, 5:42 PM

## 2012-03-12 NOTE — Progress Notes (Signed)
Name: [name] MRN: [id number] DOB: 02-26-1961    LOS: 59  PULMONARY / CRITICAL CARE MEDICINE  Brief patient description:  51 yo with history of HTN and previous intraparenchymal hemorrhage who presented to [facility] ED on 5/15 hypertensive with nasuea, vomiting, headache and was found to have [place]. Intubated.  Ventriculostomy placed.  Events Since Admission: 5/15  Presented to [facility] ED hypertensive with nasuea, vomiting, headache and was found to have [place]. Intubated.  Ventriculostomy placed. 5/16  Extubated successfully 5/17  Some hypoxia, off nicardipine drip now 5/18  Variable O2 needs, pcxr edema, tachy, trop neg 5/19  Pulled out ventriculostomy catheter, episodes of tachycardia 5/20  Briefly required Cardene gtt for BP control, ventricular drain placed back  Interval history:  Improved BP control  Vital Signs: Temp:  [100.3 F (37.9 C)-102.3 F (39.1 C)] 100.3 F (37.9 C) (05/24 0000) Pulse Rate:  [68-96] 86  (05/24 0800) Resp:  [22-35] 26  (05/24 0800) BP: (135-204)/(60-88) 196/78 mmHg (05/24 0800) SpO2:  [98 %-100 %] 100 % (05/24 0800) Weight:  [81.4 kg (179 lb 7.3 oz)] 81.4 kg (179 lb 7.3 oz) (05/24 0500)  Physical Examination: General:  Comfortable, in no distress Neuro:  Nonfocal, awake, alert HEENT:  Ventriculostomy in place, draining well, blood tinged CSF Neck: No JVD Cardiovascular:  RRR, no murmurs Lungs:  Bilateral air entry Abdomen:  Soft, nontender, nondistended, bowel sounds present Musculoskeletal:  Moves all extremities, no pedal edema Skin:  No rash  Principal Problem:  *SAH (subarachnoid hemorrhage) Active Problems:  Hypertensive emergency  Acute respiratory failure  HYPERLIPIDEMIA-MIXED  CAD, NATIVE VESSEL  DIASTOLIC HEART FAILURE, CHRONIC  CHRONIC KIDNEY DISEASE UNSPECIFIED  ASSESSMENT AND PLAN  PULMONARY  Lab 03/07/12 0505 03/06/12 1040 03/05/12 2007  PHART 7.429* 7.419* 7.414*  PCO2ART 29.8* 26.1* 30.1*  PO2ART 83.3 57.6* 81.7  HCO3 19.4*  16.5* 18.8*  O2SAT 97.3 89.9 96.3   Ventilator Settings: NA   CXR:  No new. ETT:  5/15 >>>5/16  A:  Acute respiratory failure - resolved. Several episodes of acute pulmonary edema on the background of diastolic CHF and IVF. Now minimal oxygen requirements.  No evidence of bronchospasm. P:   - Supplemental oxygen, goal SpO2 > 92% - Incentive spirometry  CARDIOVASCULAR  Lab 03/08/12 0425 03/07/12 2205 03/07/12 1818 03/07/12 0319 03/06/12 2130  TROPONINI <0.30 <0.30 <0.30 <0.30 <0.30  LATICACIDVEN -- -- -- -- --  PROBNP -- -- -- 7892.0* --   ECG:  NA Lines: NA TTE:  5/18 >>> Grade 2 diastolic disease, IVC dilated , EF 55%  A: Hypertensive emergency - resolved.  Transiently hypertensive.  History of HTN, CAD, CHF (diastolic). SVT / sinus tachycardia. P:  - Titrate Cardene gtt to off as adjusting PO regimen - Clonidine to 0.3 mg PO q8h - Increase Labetalol to 200 mg PO q12h, hold for HR < 50 - Hydralazine 50 mg PO q8h - Goal SBP < 180 and DBP < 110  RENAL  Lab 03/11/12 0345 03/10/12 0343 03/08/12 0425 03/07/12 0318 03/06/12 0428  NA 137 135 137 140 143  K 3.6 3.7 -- -- --  CL 106 104 105 110 114*  CO2 17* 17* 18* 19 19  BUN 28* 29* 22 13 12   CREATININE 1.61* 1.65* 1.84* 1.65* 1.80*  CALCIUM 8.9 8.8 9.1 8.8 8.5  MG -- -- -- -- --  PHOS -- -- -- -- --   Intake/Output      05/23 0701 - 05/24 0700 05/24 0701 - 05/25 0700  P.O. 150    I.V. (mL/kg) 785.7 (9.7) 25 (0.3)   IV Piggyback 150    Total Intake(mL/kg) 1085.7 (13.3) 25 (0.3)   Urine (mL/kg/hr) 850 (0.4) 0   Drains 279 21   Total Output 1129 21   Net -43.3 +4        Urine Occurrence 3 x 1 x    Foley:  5/15 >>>  A:  Acute on chronic renal failure, stable.  P:   - NS 50 mL/h - BMP in AM  GASTROINTESTINAL  Lab 03/08/12 0425 03/07/12 0318 03/06/12 0428  AST 10 9 11   ALT <5 <5 <5  ALKPHOS 74 74 80  BILITOT 0.3 0.3 0.3  PROT 7.0 6.3 6.0  ALBUMIN 2.8* 2.5* 2.5*   A:  No active issues P:   -   Diet  HEMATOLOGIC  Lab 03/10/12 0343 03/08/12 0425 03/07/12 0318 03/06/12 0428  HGB 12.5 12.5 11.8* 11.3*  HCT 37.3 37.0 35.1* 34.3*  PLT 230 197 177 194  INR -- -- -- --  APTT -- -- -- --   A:  No active issues P:  -  No intervention required  INFECTIOUS  Lab 03/10/12 0343 03/08/12 0425 03/07/12 0318 03/06/12 0428  WBC 9.0 9.9 8.8 11.2*  PROCALCITON -- -- -- --   Cultures: NA Antibiotics: Ancef 5/15  (per Neurosurgery) >>>  A:  No evidence of infection.  Fever, likely CNS origin.  P:   - Monitor fever curve - ABx per neurosurgery  ENDOCRINE  Lab 03/07/12 1129 03/07/12 0734 03/07/12 0412 03/07/12 03/06/12 2021  GLUCAP 136* 117* 118* 118* 162*   A:  Hyperglycemia, resolved P:   -  No intervention required  NEUROLOGIC  Head CT:   5/16 >>> Diffuse SAH with ventriculomegaly 5/20 >>> Normal size ventricles 5/21 >>> Residual intraventricular hemorrhage, redevelopment of hydrocephalus  Angio: 5/16 >>> Aneurism coiled, 3 remain TCD: 5/20 >>> Mild vasospasm, elevated ICP  A:  SAH, nontraumatic, s/p ventriculostomy (replaced 5/21).  Hydrocephalus. Multiple aneurisms, 1 of 4 coiled. P:   - Neurosurgery follows - TCD - Ventriculostomy  - Keppra - Nimodipine  BEST PRACTICE / DISPOSITION - Level of Care:  ICU - Primary Service:  Neurosurgery - Consultants:  PCCM - Code Status:  Full - Diet:  Regular diet - DVT Px:  SCDs - GI Px:  Protonix - Skin Integrity:  No issues - Social / Family:  Family is not available  Doree Fudge, M.D. Pulmonary and [place] Pager: [phone]  03/12/2012, 9:35 AM

## 2012-03-12 NOTE — Progress Notes (Signed)
Clinical Social Worker received notification from [place] that they were not going to investigate the CPS report.  CSW to sign off, please re consult if needed.   Dala Dock, MSW, [place]

## 2012-03-12 NOTE — Progress Notes (Signed)
TCD completed.  Preliminary results reported with 03-08-12.

## 2012-03-12 NOTE — Progress Notes (Signed)
  Dr. Christella Noa:  Pt's daughter Ladene Artist requests that you call her [phone] for an update on pt's status & care plans.  Thank you.

## 2012-03-13 LAB — URINALYSIS, ROUTINE W REFLEX MICROSCOPIC
Glucose, UA: NEGATIVE mg/dL
Hgb urine dipstick: NEGATIVE
Specific Gravity, Urine: 1.01 (ref 1.005–1.030)
pH: 5.5 (ref 5.0–8.0)

## 2012-03-13 LAB — BASIC METABOLIC PANEL
BUN: 40 mg/dL — ABNORMAL HIGH (ref 6–23)
Chloride: 106 mEq/L (ref 96–112)
GFR calc Af Amer: 33 mL/min — ABNORMAL LOW (ref >90)
Potassium: 4 mEq/L (ref 3.5–5.1)

## 2012-03-13 LAB — CSF CELL COUNT WITH DIFFERENTIAL
RBC Count, CSF: 710 /mm3 — ABNORMAL HIGH
WBC, CSF: 131 /mm3 (ref 0–5)

## 2012-03-13 LAB — GRAM STAIN

## 2012-03-13 LAB — URINE MICROSCOPIC-ADD ON

## 2012-03-13 MED ORDER — WHITE PETROLATUM GEL
Status: AC
  Filled 2012-03-13: qty 5

## 2012-03-13 MED ORDER — SODIUM CHLORIDE 0.9 % IV SOLN
INTRAVENOUS | Status: DC
  Administered 2012-03-13 – 2012-03-16 (×2): via INTRAVENOUS

## 2012-03-13 MED ORDER — LABETALOL HCL 5 MG/ML IV SOLN
10.0000 mg | INTRAVENOUS | Status: DC | PRN
  Administered 2012-03-13 (×2): 10 mg via INTRAVENOUS
  Administered 2012-03-13: 10:00:00 via INTRAVENOUS
  Administered 2012-03-14 – 2012-03-16 (×4): 10 mg via INTRAVENOUS
  Filled 2012-03-13 (×5): qty 4

## 2012-03-13 MED ORDER — LABETALOL HCL 5 MG/ML IV SOLN
INTRAVENOUS | Status: AC
  Filled 2012-03-13: qty 4

## 2012-03-13 NOTE — Progress Notes (Signed)
Name: [name] MRN: [id number] DOB: 02-Aug-1961    LOS: 68  PULMONARY / CRITICAL CARE MEDICINE  Brief patient description:  51 yo with history of HTN and previous intraparenchymal hemorrhage who presented to [facility] ED on 5/15 hypertensive with nasuea, vomiting, headache and was found to have [place]. Intubated.  Ventriculostomy placed.  Events Since Admission: 5/15  Presented to [facility] ED hypertensive with nasuea, vomiting, headache and was found to have [place]. Intubated.  Ventriculostomy placed. 5/16  Extubated successfully 5/17  Some hypoxia, off nicardipine drip now 5/18  Variable O2 needs, pcxr edema, tachy, trop neg 5/19  Pulled out ventriculostomy catheter, episodes of tachycardia 5/20  Briefly required Cardene gtt for BP control, ventricular drain placed back  Interval history:  Improved BP control, on cardiazem drip  Vital Signs: Temp:  [99 F (37.2 C)-102.2 F (39 C)] 99 F (37.2 C) (05/25 0400) Pulse Rate:  [55-134] 75  (05/25 0800) Resp:  [17-35] 29  (05/25 0800) BP: (105-173)/(52-93) 173/82 mmHg (05/25 0800) SpO2:  [98 %-100 %] 100 % (05/25 0800)  Physical Examination: General:  Comfortable, in no distress Neuro:  Nonfocal, awake, alert HEENT:  Ventriculostomy in place, draining well, blood tinged CSF Neck: No JVD Cardiovascular:  RRR, no murmurs Lungs:  Bilateral air entry Abdomen:  Soft, nontender, nondistended, bowel sounds present Musculoskeletal:  Moves all extremities, no pedal edema Skin:  No rash  Principal Problem:  *SAH (subarachnoid hemorrhage) Active Problems:  HYPERLIPIDEMIA-MIXED  CAD, NATIVE VESSEL  DIASTOLIC HEART FAILURE, CHRONIC  CHRONIC KIDNEY DISEASE UNSPECIFIED  Hypertensive emergency  Acute respiratory failure  ASSESSMENT AND PLAN  PULMONARY  Lab 03/07/12 0505 03/06/12 1040  PHART 7.429* 7.419*  PCO2ART 29.8* 26.1*  PO2ART 83.3 57.6*  HCO3 19.4* 16.5*  O2SAT 97.3 89.9   Ventilator Settings: NA   CXR:  No new. ETT:  5/15  >>>5/16  A:  Acute respiratory failure - resolved. Several episodes of acute pulmonary edema on the background of diastolic CHF and IVF. Now minimal oxygen requirements.  No evidence of bronchospasm. P:   - Supplemental oxygen, goal SpO2 > 92% - Incentive spirometry  CARDIOVASCULAR  Lab 03/08/12 0425 03/07/12 2205 03/07/12 1818 03/07/12 0319 03/06/12 2130  TROPONINI <0.30 <0.30 <0.30 <0.30 <0.30  LATICACIDVEN -- -- -- -- --  PROBNP -- -- -- 7892.0* --   ECG:  NA Lines: NA TTE:  5/18 >>> Grade 2 diastolic disease, IVC dilated , EF 55%  A: Hypertensive emergency - resolved.  Transiently hypertensive.  History of HTN, CAD, CHF (diastolic). SVT / sinus tachycardia. P:  -- Clonidine to 0.3 mg PO q8h - cont  Labetalol to 200 mg PO q12h, hold for HR < 50 - Hydralazine 50 mg PO q8h - Goal SBP < 180 and DBP < 110 -titrating dilt drip RENAL  Lab 03/13/12 0439 03/11/12 0345 03/10/12 0343 03/08/12 0425 03/07/12 0318  NA 137 137 135 137 140  K 4.0 3.6 -- -- --  CL 106 106 104 105 110  CO2 17* 17* 17* 18* 19  BUN 40* 28* 29* 22 13  CREATININE 2.00* 1.61* 1.65* 1.84* 1.65*  CALCIUM 9.2 8.9 8.8 9.1 8.8  MG -- -- -- -- --  PHOS -- -- -- -- --   Intake/Output      05/24 0701 - 05/25 0700 05/25 0701 - 05/26 0700   P.O.     I.V. (mL/kg) 792.7 (9.7)    IV Piggyback 100    Total Intake(mL/kg) 892.7 (11)  Urine (mL/kg/hr) 250 (0.1)    Drains 246    Total Output 496    Net +396.7         Urine Occurrence 6 x     Foley:  5/25 >>>  A:  Acute on chronic renal failure, stable.  P:   - NS 50 mL/h - BMP in AM  GASTROINTESTINAL  Lab 03/08/12 0425 03/07/12 0318  AST 10 9  ALT <5 <5  ALKPHOS 74 74  BILITOT 0.3 0.3  PROT 7.0 6.3  ALBUMIN 2.8* 2.5*   A:  No active issues P:   -  Diet  HEMATOLOGIC  Lab 03/10/12 0343 03/08/12 0425 03/07/12 0318  HGB 12.5 12.5 11.8*  HCT 37.3 37.0 35.1*  PLT 230 197 177  INR -- -- --  APTT -- -- --   A:  No active issues P:  -  No  intervention required  INFECTIOUS  Lab 03/10/12 0343 03/08/12 0425 03/07/12 0318  WBC 9.0 9.9 8.8  PROCALCITON -- -- --   Cultures: NA Antibiotics: Ancef 5/15  (per Neurosurgery) >>>  A:  No evidence of infection.  Fever, likely CNS origin.  P:   - Monitor fever curve - ABx per neurosurgery  ENDOCRINE  Lab 03/07/12 1129 03/07/12 0734 03/07/12 0412 03/07/12 03/06/12 2021  GLUCAP 136* 117* 118* 118* 162*   A:  Hyperglycemia, resolved P:   -  No intervention required  NEUROLOGIC  Head CT:   5/16 >>> Diffuse SAH with ventriculomegaly 5/20 >>> Normal size ventricles 5/21 >>> Residual intraventricular hemorrhage, redevelopment of hydrocephalus  Angio: 5/16 >>> Aneurism coiled, 3 remain TCD: 5/20 >>> Mild vasospasm, elevated ICP  A:  SAH, nontraumatic, s/p ventriculostomy (replaced 5/21).  Hydrocephalus. Multiple aneurisms, 1 of 4 coiled. P:   - Neurosurgery follows - TCD - Ventriculostomy  - Keppra - Nimodipine  BEST PRACTICE / DISPOSITION - Level of Care:  ICU - Primary Service:  Neurosurgery - Consultants:  PCCM - Code Status:  Full - Diet:  Regular diet - DVT Px:  SCDs - GI Px:  Protonix - Skin Integrity:  No issues - Social / Family:  Family is not available  Asencion Noble, M.D. [place]  [phone]  Cell  [phone]  If no response or cell goes to voicemail, call beeper [phone]   03/13/2012, 8:30 AM

## 2012-03-13 NOTE — Progress Notes (Signed)
Patient ID: [name], female   DOB: [birth year], 51 y.o.   MRN: [id number] Subjective: Patient reports continued headache and some neck pain.  Objective: Vital signs in last 24 hours: Temp:  [99 F (37.2 C)-102.3 F (39.1 C)] 99 F (37.2 C) (05/25 0400) Pulse Rate:  [55-134] 64  (05/25 0600) Resp:  [17-35] 17  (05/25 0600) BP: (105-196)/(52-93) 149/67 mmHg (05/25 0600) SpO2:  [98 %-100 %] 100 % (05/25 0600)  Intake/Output from previous day: 05/24 0701 - 05/25 0700 In: 852.7 [I.V.:752.7; IV Piggyback:100] Out: 496 [Urine:250; Drains:246] Intake/Output this shift: Total I/O In: 147.5 [I.V.:97.5; IV Piggyback:50] Out: 292 [Urine:200; Drains:92]  Neurologic: Mental status: alertness: alert, orientation: person, place Motor: Normal Some disorientation Ventriculostomy patent at 10 cm  Lab Results: Lab Results  Component Value Date   WBC 9.0 03/10/2012   HGB 12.5 03/10/2012   HCT 37.3 03/10/2012   MCV 86.7 03/10/2012   PLT 230 03/10/2012   Lab Results  Component Value Date   INR 0.98 03/03/2012   BMET Lab Results  Component Value Date   NA 137 03/13/2012   K 4.0 03/13/2012   CL 106 03/13/2012   CO2 17* 03/13/2012   GLUCOSE 125* 03/13/2012   BUN 40* 03/13/2012   CREATININE 2.00* 03/13/2012   CALCIUM 9.2 03/13/2012    Studies/Results: No results found.  Assessment/Plan: Continued ventriculostomy. Sodium normal. Continue current management.   LOS: 10 days    Damarkus Balis S 03/13/2012, 6:12 AM

## 2012-03-14 ENCOUNTER — Inpatient Hospital Stay (HOSPITAL_COMMUNITY): Payer: No Typology Code available for payment source

## 2012-03-14 LAB — CBC
Hemoglobin: 10.6 g/dL — ABNORMAL LOW (ref 12.0–15.0)
MCH: 29 pg (ref 26.0–34.0)
MCHC: 33.7 g/dL (ref 30.0–36.0)
RDW: 14.8 % (ref 11.5–15.5)

## 2012-03-14 LAB — BASIC METABOLIC PANEL
BUN: 35 mg/dL — ABNORMAL HIGH (ref 6–23)
CO2: 16 mEq/L — ABNORMAL LOW (ref 19–32)
Calcium: 8.6 mg/dL (ref 8.4–10.5)
Chloride: 108 mEq/L (ref 96–112)
Creatinine, Ser: 1.7 mg/dL — ABNORMAL HIGH (ref 0.50–1.10)
GFR calc non Af Amer: 34 mL/min — ABNORMAL LOW (ref >90)
Sodium: 135 mEq/L (ref 135–145)

## 2012-03-14 MED ORDER — NIMODIPINE 30 MG/ML ORAL SOLUTION
60.0000 mg | ORAL | Status: DC
  Administered 2012-03-14 – 2012-03-25 (×64): 60 mg via ORAL
  Filled 2012-03-14 (×70): qty 2

## 2012-03-14 MED ORDER — LEVETIRACETAM 500 MG/5ML IV SOLN
500.0000 mg | Freq: Two times a day (BID) | INTRAVENOUS | Status: DC
  Administered 2012-03-14 – 2012-03-20 (×12): 500 mg via INTRAVENOUS
  Filled 2012-03-14 (×13): qty 5

## 2012-03-14 NOTE — Progress Notes (Signed)
Patient ID: [name], female   DOB: [birth year], 51 y.o.   MRN: [id number] Subjective: Patient reports some mild headache.  Objective: Vital signs in last 24 hours: Temp:  [98.6 F (37 C)-102.3 F (39.1 C)] 98.6 F (37 C) (05/26 0400) Pulse Rate:  [63-132] 64  (05/26 0730) Resp:  [19-32] 23  (05/26 0730) BP: (127-178)/(59-85) 178/84 mmHg (05/26 0730) SpO2:  [97 %-100 %] 98 % (05/26 0730)  Intake/Output from previous day: 05/25 0701 - 05/26 0700 In: 3110 [P.O.:1140; I.V.:1920; IV Piggyback:50] Out: 2646 [Urine:2300; Drains:346] Intake/Output this shift:    Neurological exam: Patient is awake and alert, some interaction with the examiner, states her name, and follows commands though she follows the more briskly on the left than the right. She does move her right-side, and her right hand remains in a glove to prevent her from manipulating her ventriculostomy. The ventriculostomy remains patent at 10 cm with drainage of pink CSF.  Lab Results: Lab Results  Component Value Date   WBC 10.4 03/14/2012   HGB 10.6* 03/14/2012   HCT 31.5* 03/14/2012   MCV 86.1 03/14/2012   PLT 299 03/14/2012   Lab Results  Component Value Date   INR 0.98 03/03/2012   BMET Lab Results  Component Value Date   NA 135 03/14/2012   K 4.2 03/14/2012   CL 108 03/14/2012   CO2 16* 03/14/2012   GLUCOSE 117* 03/14/2012   BUN 35* 03/14/2012   CREATININE 1.70* 03/14/2012   CALCIUM 8.6 03/14/2012    Studies/Results: No results found.  Assessment/Plan: . The patient is stable. CSF cultures pending. Continue ventriculostomy drain. Afebrile this am.   LOS: 11 days    Ferne Ellingwood S 03/14/2012, 7:56 AM

## 2012-03-14 NOTE — Progress Notes (Signed)
Name: [name] MRN: [id number] DOB: 12-18-1960    LOS: 1  PULMONARY / CRITICAL CARE MEDICINE  Brief patient description:  51 yo with history of HTN and previous intraparenchymal hemorrhage who presented to [facility] ED on 5/15 hypertensive with nasuea, vomiting, headache and was found to have [place]. Intubated.  Ventriculostomy placed.  Events Since Admission: 5/15  Presented to [facility] ED hypertensive with nasuea, vomiting, headache and was found to have [place]. Intubated.  Ventriculostomy placed. 5/16  Extubated successfully 5/17  Some hypoxia, off nicardipine drip now 5/18  Variable O2 needs, pcxr edema, tachy, trop neg 5/19  Pulled out ventriculostomy catheter, episodes of tachycardia 5/20  Briefly required Cardene gtt for BP control, ventricular drain placed back  Interval history:   Awake and follows commands Vital Signs: Temp:  [98.6 F (37 C)-101.8 F (38.8 C)] 98.7 F (37.1 C) (05/26 0800) Pulse Rate:  [63-132] 69  (05/26 0900) Resp:  [19-32] 21  (05/26 0900) BP: (127-178)/(59-85) 172/82 mmHg (05/26 0900) SpO2:  [97 %-100 %] 99 % (05/26 0900)  Physical Examination: General:  Comfortable, in no distress Neuro:  Nonfocal, awake, alert HEENT:  Ventriculostomy in place, draining well, blood tinged CSF Neck: No JVD Cardiovascular:  RRR, no murmurs Lungs:  Bilateral air entry Abdomen:  Soft, nontender, nondistended, bowel sounds present Musculoskeletal:  Moves all extremities, no pedal edema Skin:  No rash  Principal Problem:  *SAH (subarachnoid hemorrhage) Active Problems:  HYPERLIPIDEMIA-MIXED  CAD, NATIVE VESSEL  DIASTOLIC HEART FAILURE, CHRONIC  CHRONIC KIDNEY DISEASE UNSPECIFIED  Hypertensive emergency  Acute respiratory failure  ASSESSMENT AND PLAN  PULMONARY No results found for this basename: PHART:5,PCO2:5,PCO2ART:5,PO2ART:5,HCO3:5,O2SAT:5 in the last 168 hours Ventilator Settings: NA   CXR:  No new. ETT:  5/15 >>>5/16  A:  Acute respiratory failure -  resolved. Several episodes of acute pulmonary edema on the background of diastolic CHF and IVF. Now minimal oxygen requirements.  No evidence of bronchospasm. P:   - Supplemental oxygen, goal SpO2 > 92% - Incentive spirometry  CARDIOVASCULAR  Lab 03/08/12 0425 03/07/12 2205 03/07/12 1818  TROPONINI <0.30 <0.30 <0.30  LATICACIDVEN -- -- --  PROBNP -- -- --   ECG:  NA Lines: NA TTE:  5/18 >>> Grade 2 diastolic disease, IVC dilated , EF 55%  A: Hypertensive emergency - resolved.  Transiently hypertensive.  History of HTN, CAD, CHF (diastolic). SVT / sinus tachycardia. P:  -- Clonidine to 0.3 mg PO q8h - cont  Labetalol to 200 mg PO q12h, hold for HR < 50 - Hydralazine 50 mg PO q8h - Goal SBP < 180 and DBP < 110 -titrating dilt drip, currently @15  -dc Nicoderm patch 5/26 RENAL  Lab 03/14/12 0400 03/13/12 0439 03/11/12 0345 03/10/12 0343 03/08/12 0425  NA 135 137 137 135 137  K 4.2 4.0 -- -- --  CL 108 106 106 104 105  CO2 16* 17* 17* 17* 18*  BUN 35* 40* 28* 29* 22  CREATININE 1.70* 2.00* 1.61* 1.65* 1.84*  CALCIUM 8.6 9.2 8.9 8.8 9.1  MG -- -- -- -- --  PHOS -- -- -- -- --   Intake/Output      05/25 0701 - 05/26 0700 05/26 0701 - 05/27 0700   P.O. 1140    I.V. (mL/kg) 1920 (23.6) 165 (2)   IV Piggyback 50    Total Intake(mL/kg) 3110 (38.2) 165 (2)   Urine (mL/kg/hr) 2300 (1.2) 520   Drains 346 38   Total Output 2646 558   Net +464 -  393         Foley:  5/25 >>>  A:  Acute on chronic renal failure, stable.  P:   - NS 50 mL/h - BMP in AM  GASTROINTESTINAL  Lab 03/08/12 0425  AST 10  ALT <5  ALKPHOS 74  BILITOT 0.3  PROT 7.0  ALBUMIN 2.8*   A:  No active issues P:   -  Diet  HEMATOLOGIC  Lab 03/14/12 0400 03/10/12 0343 03/08/12 0425  HGB 10.6* 12.5 12.5  HCT 31.5* 37.3 37.0  PLT 299 230 197  INR -- -- --  APTT -- -- --   A:  No active issues P:  -  No intervention required  INFECTIOUS  Lab 03/14/12 0400 03/10/12 0343 03/08/12 0425  WBC  10.4 9.0 9.9  PROCALCITON -- -- --   Cultures: NA Antibiotics: Ancef 5/15  (per Neurosurgery) >>>  A:  No evidence of infection.  Fever, likely CNS origin.  P:   - Monitor fever curve - ABx per neurosurgery  ENDOCRINE  Lab 03/07/12 1129  GLUCAP 136*   A:  Hyperglycemia, resolved P:   -  No intervention required  NEUROLOGIC  Head CT:   5/16 >>> Diffuse SAH with ventriculomegaly 5/20 >>> Normal size ventricles 5/21 >>> Residual intraventricular hemorrhage, redevelopment of hydrocephalus  Angio: 5/16 >>> Aneurism coiled, 3 remain TCD: 5/20 >>> Mild vasospasm, elevated ICP  A:  SAH, nontraumatic, s/p ventriculostomy (replaced 5/21).  Hydrocephalus. Multiple aneurisms, 1 of 4 coiled. P:   - Neurosurgery follows - TCD - Ventriculostomy  - Keppra - Nimodipine  BEST PRACTICE / DISPOSITION - Level of Care:  ICU - Primary Service:  Neurosurgery - Consultants:  PCCM - Code Status:  Full - Diet:  Regular diet - DVT Px:  SCDs - GI Px:  Protonix - Skin Integrity:  No issues - Social / Family:  Family is not available  Richardson Landry Minor ACNP Maryanna Shape PCCM Pager [phone] till 3 pm If no answer page [phone] 03/14/2012, 9:31 AM  Reviewed above, examined pt, and agree with assessment/plan.  PCCM will check back Tuesday 05/28.  Please call if help needed sooner.  Chesley Mires, MD 03/14/2012, 11:10 AM Pager:  [phone]

## 2012-03-15 ENCOUNTER — Inpatient Hospital Stay (HOSPITAL_COMMUNITY): Payer: No Typology Code available for payment source

## 2012-03-15 DIAGNOSIS — I609 Nontraumatic subarachnoid hemorrhage, unspecified: Secondary | ICD-10-CM

## 2012-03-15 LAB — CBC
HCT: 32.8 % — ABNORMAL LOW (ref 36.0–46.0)
MCHC: 33.2 g/dL (ref 30.0–36.0)
MCV: 86.5 fL (ref 78.0–100.0)
RDW: 14.9 % (ref 11.5–15.5)
WBC: 11.4 10*3/uL — ABNORMAL HIGH (ref 4.0–10.5)

## 2012-03-15 LAB — BASIC METABOLIC PANEL
BUN: 31 mg/dL — ABNORMAL HIGH (ref 6–23)
Chloride: 107 mEq/L (ref 96–112)
Creatinine, Ser: 1.59 mg/dL — ABNORMAL HIGH (ref 0.50–1.10)
GFR calc Af Amer: 43 mL/min — ABNORMAL LOW (ref >90)
Glucose, Bld: 116 mg/dL — ABNORMAL HIGH (ref 70–99)

## 2012-03-15 LAB — CULTURE, BLOOD (ROUTINE X 2)
Culture  Setup Time: 201305210218
Culture: NO GROWTH

## 2012-03-15 MED ORDER — MOXIFLOXACIN HCL IN NACL 400 MG/250ML IV SOLN
400.0000 mg | INTRAVENOUS | Status: DC
  Administered 2012-03-15 – 2012-03-22 (×8): 400 mg via INTRAVENOUS
  Filled 2012-03-15 (×10): qty 250

## 2012-03-15 MED ORDER — VANCOMYCIN HCL IN DEXTROSE 1-5 GM/200ML-% IV SOLN
1000.0000 mg | Freq: Two times a day (BID) | INTRAVENOUS | Status: DC
  Filled 2012-03-15 (×2): qty 200

## 2012-03-15 MED ORDER — VANCOMYCIN HCL IN DEXTROSE 1-5 GM/200ML-% IV SOLN
1000.0000 mg | INTRAVENOUS | Status: DC
  Administered 2012-03-15 – 2012-03-19 (×5): 1000 mg via INTRAVENOUS
  Filled 2012-03-15 (×5): qty 200

## 2012-03-15 MED ORDER — DEXTROSE 5 % IV SOLN: 2.0000 g | INTRAVENOUS | Status: DC

## 2012-03-15 NOTE — Progress Notes (Signed)
ANTIBIOTIC CONSULT NOTE - INITIAL  Pharmacy Consult for vancomycin Indication: fever  Allergies  Allergen Reactions  . Ampicillin Other (See Comments)    unknown  . Penicillins Other (See Comments)    unknown    Patient Measurements: Height: 5\' 1"  (154.9 cm) Weight: 167 lb 8.8 oz (76 kg) IBW/kg (Calculated) : 47.8    Vital Signs: Temp: 100.3 F (37.9 C) (05/27 0700) Temp src: Oral (05/27 0700) BP: 149/67 mmHg (05/27 0700) Pulse Rate: 66  (05/27 0700) Intake/Output from previous day: 05/26 0701 - 05/27 0700 In: 2230 [I.V.:2130; IV Piggyback:100] Out: 2831 [Urine:2560; Drains:271] Intake/Output from this shift: Total I/O In: -  Out: 366 [Urine:350; Drains:16]  Labs:  [facility] 03/15/12 0410 03/14/12 0400 03/13/12 0439  WBC 11.4* 10.4 --  HGB 10.9* 10.6* --  PLT 351 299 --  LABCREA -- -- --  CREATININE 1.59* 1.70* 2.00*   Estimated Creatinine Clearance: 39.5 ml/min (by C-G formula based on Cr of 1.59).   Medical History: Past Medical History  Diagnosis Date  . HTN (hypertension)     resistant  . Hypercholesterolemia   . Tobacco abuse     resumed smoking half a pack a day. She was a smoker in the past and states she was abl eto stop in the past using nicotine patches  . CAD (coronary artery disease)     Pt has St elevation MI in Feb 2009. Left hear tcath at tha ttime showed a 70% distal LAD lesion that was hazy consistent with a plaque rupture. She had a 50% distal circumflex stenosis an a 95% distal RCA stenosis. She did havve drug eluting stent placed in her distal RCA  . Chronic kidney disease     most recent creatinine 1.4 in 3/10  . Intracranial hemorrhage, spontaneous intraparenchymal, idiopathic, remote, resolved   . Diastolic heart failure     pt most recent echo wasin Feb 2009 w an EF of 60% and severe left ventricular hypertrophy. The pt did have evidence of diastolic dysfunction. The RV appeared normal    Medications:  Scheduled:    . atropine   1 mg Intravenous Once  . cloNIDine  0.3 mg Oral Q8H  . feeding supplement  237 mL Oral BID BM  . hydrALAZINE  50 mg Oral Q8H  . labetalol  200 mg Oral Q12H  . levetiracetam  500 mg Intravenous Q12H  . moxifloxacin  400 mg Intravenous Q24H  . niMODipine  60 mg Oral Q4H  . pantoprazole  40 mg Oral QHS  . senna-docusate  1 tablet Oral BID  . vancomycin  1,000 mg Intravenous Q24H  . DISCONTD:  ceFAZolin (ANCEF) IV  1 g Intravenous Q12H  . DISCONTD: cefTRIAXone (ROCEPHIN)  IV  2 g Intravenous Q24H  . DISCONTD: levETIRAcetam  500 mg Oral Q12H  . DISCONTD: nicotine  21 mg Transdermal Q24H  . DISCONTD: niMODipine  60 mg Oral Q4H  . DISCONTD: vancomycin  1,000 mg Intravenous Q12H   Assessment: 51 yo female with SAH and ventriculostomy. Patient has been on Ancef with continued fever to be change to vancomycin. Cultures are NGTD and WBC shows mild elevation.  Patient noted with renal impairment but with improving SCr over the last couple days.  Goal of Therapy:  Vancomycin trough level 15-20 mcg/ml  Plan:  -Vancomycin 1000mg  IV q24hr for now. -Will follow renal function as change will be needed if SCr continues to trend down -Will follow cultures and vancomycin levels as needed  Thank you  for asking pharmacy to be involved in the care of this patient.  Hildred Laser, Pharm D 03/15/2012 9:07 AM

## 2012-03-15 NOTE — Progress Notes (Signed)
VASCULAR LAB PRELIMINARY  PRELIMINARY  PRELIMINARY  PRELIMINARY  TCD completed.    Preliminary report:  SAH TCD completed results found notes of 03/08/12   Toma Copier D, RVS 03/15/2012, 4:41 PM

## 2012-03-15 NOTE — Progress Notes (Signed)
Subjective: Patient reports Overall she's feeling okay she still is a bit of a headache otherwise nonfocal  Objective: Vital signs in last 24 hours: Temp:  [99.2 F (37.3 C)-100.3 F (37.9 C)] 100.3 F (37.9 C) (05/27 0700) Pulse Rate:  [54-76] 66  (05/27 0700) Resp:  [17-29] 29  (05/27 0700) BP: (126-172)/(58-86) 149/67 mmHg (05/27 0700) SpO2:  [93 %-99 %] 97 % (05/27 0700) Weight:  [76 kg (167 lb 8.8 oz)] 76 kg (167 lb 8.8 oz) (05/27 0200)  Intake/Output from previous day: 05/26 0701 - 05/27 0700 In: 2230 [I.V.:2130; IV Piggyback:100] Out: 2831 [Urine:2560; Drains:271] Intake/Output this shift:    Strength 5 out of 5 no pronator drift  Lab Results:  Basename 03/15/12 0410 03/14/12 0400  WBC 11.4* 10.4  HGB 10.9* 10.6*  HCT 32.8* 31.5*  PLT 351 299   BMET  Basename 03/15/12 0410 03/14/12 0400  NA 135 135  K 4.7 4.2  CL 107 108  CO2 16* 16*  GLUCOSE 116* 117*  BUN 31* 35*  CREATININE 1.59* 1.70*  CALCIUM 9.0 8.6    Studies/Results: Ct Head Wo Contrast  03/14/2012  *RADIOLOGY REPORT*  Clinical Data: Change in behavior, decreased level of consciousness.  CT HEAD WITHOUT CONTRAST  Technique:  Contiguous axial images were obtained from the base of the skull through the vertex without contrast.  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  Ventricular drainage catheter was self-removed on 05/21 and was reinserted the same day. The intraventricular catheter enters from a right frontal approach and lies completely in the right lateral ventricle.  There is slight hypodensity surrounding the catheter which could represent passage of CSF along the track.  Intraventricular hemorrhage continues to layer in the posterior horns of the lateral ventricles.  The biventricular diameter is increased at 42 mm, which is greater than that seen on the most recent prior CT head of  37 mm.  Temporal horns remain to her June. Streak artifact from aneurysm clips and basilar coil mass obscure much of the suprasellar cistern regions.  No new subarachnoid blood is evident.  IMPRESSION: Moderate ventricular enlargement despite apparent satisfactory placement of intraventricular drainage catheter consistent with post subarachnoid hemorrhage hydrocephalus.  Correlate with CSF pressures and tube patency.  No new subarachnoid hemorrhage or interval significant intra-axial abnormality is observed.  Original Report Authenticated By: Staci Righter, M.D.   Dg Chest Port 1 View  03/15/2012  *RADIOLOGY REPORT*  Clinical Data: Extubation.  Recent pulmonary edema.  PORTABLE CHEST - 1 VIEW  Comparison: 03/08/2012  Findings: Chronic cardiomegaly.  Pulmonary vascularity is now within normal limits.  Minimal linear atelectasis at the left base. Probable tiny residual right effusion.  No visible left effusion.  IMPRESSION: Minimal linear atelectasis at the left base.  Probable tiny right effusion. The appearance of the chest is much improved since the prior exam.  Original Report Authenticated By: Larey Seat, M.D.    Assessment/Plan: Continue excellent particular drained cultures and CSF appear to be negative although white count mildly elevated we'll change her antibiotics and add vancomycin  LOS: 12 days     Tabitha Tupper P 03/15/2012, 8:32 AM

## 2012-03-16 DIAGNOSIS — E872 Acidosis: Secondary | ICD-10-CM

## 2012-03-16 LAB — CSF CULTURE W GRAM STAIN: Culture: NO GROWTH

## 2012-03-16 MED ORDER — LABETALOL HCL 200 MG PO TABS
200.0000 mg | ORAL_TABLET | Freq: Three times a day (TID) | ORAL | Status: DC
  Administered 2012-03-16 – 2012-03-17 (×2): 200 mg via ORAL
  Filled 2012-03-16 (×5): qty 1

## 2012-03-16 MED ORDER — SODIUM BICARBONATE 8.4 % IV SOLN
INTRAVENOUS | Status: DC
  Administered 2012-03-16 – 2012-03-17 (×2): via INTRAVENOUS
  Filled 2012-03-16 (×4): qty 150

## 2012-03-16 MED ORDER — HYDRALAZINE HCL 20 MG/ML IJ SOLN
INTRAMUSCULAR | Status: AC
  Administered 2012-03-16: 10 mg via INTRAVENOUS
  Filled 2012-03-16: qty 1

## 2012-03-16 MED ORDER — NICARDIPINE HCL IN NACL 20-0.86 MG/200ML-% IV SOLN
5.0000 mg/h | INTRAVENOUS | Status: DC
  Administered 2012-03-16: 5 mg/h via INTRAVENOUS
  Administered 2012-03-16: 2.5 mg/h via INTRAVENOUS
  Administered 2012-03-17: 5 mg/h via INTRAVENOUS
  Administered 2012-03-17: 2.5 mg/h via INTRAVENOUS
  Administered 2012-03-17 – 2012-03-27 (×5): 5 mg/h via INTRAVENOUS
  Administered 2012-03-28: 2 mg/h via INTRAVENOUS
  Administered 2012-03-29: 5 mg/h via INTRAVENOUS
  Administered 2012-03-29: 1 mg/h via INTRAVENOUS
  Administered 2012-03-29: 5 mg/h via INTRAVENOUS
  Administered 2012-03-30: 2 mg/h via INTRAVENOUS
  Administered 2012-03-31: 5 mg/h via INTRAVENOUS
  Filled 2012-03-16 (×18): qty 200

## 2012-03-16 MED ORDER — HYDRALAZINE HCL 20 MG/ML IJ SOLN
10.0000 mg | INTRAMUSCULAR | Status: DC | PRN
  Administered 2012-03-16 (×2): 10 mg via INTRAVENOUS

## 2012-03-16 NOTE — Progress Notes (Signed)
Patient ID: [name], female   DOB: [birth year], 51 y.o.   MRN: [id number] BP 151/73  Pulse 80  Temp(Src) 98.8 F (37.1 C) (Oral)  Resp 27  Ht 5\' 1"  (1.549 m)  Wt 76 kg (167 lb 8.8 oz)  BMI 31.66 kg/m2  SpO2 97% Alert, oriented to person. Believes she is in [place], does follow commands.  Perrl, full eom Symmetric facies, tongue and uvula midline Moves all extremities Ventricular catheter draining well CSF gram stain and cultures negative

## 2012-03-16 NOTE — Progress Notes (Signed)
Name: [name] MRN: [id number] DOB: 08/12/1961    LOS: 13  PULMONARY / CRITICAL CARE MEDICINE  Brief patient description:  51 yo with history of HTN and previous intraparenchymal hemorrhage who presented to [facility] ED on 5/15 hypertensive with nasuea, vomiting, headache and was found to have [place]. Intubated.  Ventriculostomy placed.  Events Since Admission: 5/15  Presented to [facility] ED hypertensive with nasuea, vomiting, headache and was found to have [place]. Intubated.  Ventriculostomy placed. 5/16  Extubated successfully 5/17  Some hypoxia, off nicardipine drip now 5/18  Variable O2 needs, pcxr edema, tachy, trop neg 5/19  Pulled out ventriculostomy catheter, episodes of tachycardia 5/20  Briefly required Cardene gtt for BP control, ventricular drain placed back  Interval history:   Awake and follows commands Vital Signs: Temp:  [98.4 F (36.9 C)-99.4 F (37.4 C)] 98.8 F (37.1 C) (05/28 0400) Pulse Rate:  [56-82] 71  (05/28 1020) Resp:  [18-29] 22  (05/28 1020) BP: (112-190)/(55-101) 173/87 mmHg (05/28 1037) SpO2:  [96 %-100 %] 97 % (05/28 1020)  Physical Examination: General:  Comfortable, in no distress Neuro:  Nonfocal, awake, alert HEENT:  Ventriculostomy in place, draining well, blood tinged CSF Neck: No JVD Cardiovascular:  RRR, no murmurs Lungs:  Bilateral air entry Abdomen:  Soft, nontender, nondistended, bowel sounds present Musculoskeletal:  Moves all extremities, no pedal edema Skin:  No rash  Principal Problem:  *SAH (subarachnoid hemorrhage) Active Problems:  HYPERLIPIDEMIA-MIXED  CAD, NATIVE VESSEL  DIASTOLIC HEART FAILURE, CHRONIC  CHRONIC KIDNEY DISEASE UNSPECIFIED  Hypertensive emergency  Acute respiratory failure  ASSESSMENT AND PLAN  PULMONARY No results found for this basename: PHART:5,PCO2:5,PCO2ART:5,PO2ART:5,HCO3:5,O2SAT:5 in the last 168 hours Ventilator Settings: NA   CXR:  No new findings. ETT:  5/15 >>>5/16.  A:  Acute respiratory failure  - resolved. Several episodes of acute pulmonary edema on the background of diastolic CHF and IVF. Now minimal oxygen requirements.  No evidence of bronchospasm. P:   - Supplemental oxygen, goal SpO2 > 92% - Incentive spirometry.  CARDIOVASCULAR  Lab 03/14/12 0400  TROPONINI --  LATICACIDVEN --  PROBNP 1667.0*   TTE:  5/18 >>> Grade 2 diastolic disease, IVC dilated , EF 55%  A: Hypertensive emergency - resolved.  Transiently hypertensive.  History of HTN, CAD, CHF (diastolic). SVT / sinus tachycardia. P:  - Clonidine to 0.3 mg PO q8h - Increase  Labetalol to 200 mg PO q8h, with holding parameters for SBP of 150 or HR of 60. - Hydralazine 50 mg PO q8h plus add PRN IV hydralazine. - Goal SBP < 160 - Titrating dilt drip, currently @15  - D/C Nicoderm patch 5/26  RENAL Lab 03/15/12 0410 03/14/12 0400 03/13/12 0439 03/11/12 0345 03/10/12 0343  NA 135 135 137 137 135  K 4.7 4.2 -- -- --  CL 107 108 106 106 104  CO2 16* 16* 17* 17* 17*  BUN 31* 35* 40* 28* 29*  CREATININE 1.59* 1.70* 2.00* 1.61* 1.65*  CALCIUM 9.0 8.6 9.2 8.9 8.8  MG -- -- -- -- --  PHOS -- -- -- -- --   Intake/Output      05/27 0701 - 05/28 0700 05/28 0701 - 05/29 0700   P.O. 30 240   I.V. (mL/kg) 1825 (24) 225 (3)   IV Piggyback 660 305   Total Intake(mL/kg) 2515 (33.1) 770 (10.1)   Urine (mL/kg/hr) 3290 (1.8) 535   Drains 283 35   Total Output 3573 570   Net -1058 +200  Foley:  5/25 >>>  A:  Acute on chronic renal failure, improving.  NAG acidosis due to hyperchloremia. P:   - Change NS to bicarb drip to provide sodium without the chloride to avoid further acidosis. - BMP in AM  GASTROINTESTINAL No results found for this basename: AST:5,ALT:5,ALKPHOS:5,BILITOT:5,PROT:5,ALBUMIN:5 in the last 168 hours A:  No active issues P:   - Diet.  HEMATOLOGIC  Lab 03/15/12 0410 03/14/12 0400 03/10/12 0343  HGB 10.9* 10.6* 12.5  HCT 32.8* 31.5* 37.3  PLT 351 299 230  INR -- -- --  APTT -- -- --     A:  No active issues P:  -  No intervention required  INFECTIOUS  Lab 03/15/12 0410 03/14/12 0400 03/10/12 0343  WBC 11.4* 10.4 9.0  PROCALCITON -- -- --   Cultures: NA Antibiotics: Ancef 5/15  (per Neurosurgery) >>>  A:  No evidence of infection.  Fever, likely CNS origin.  P:   - Monitor fever curve - ABx per neurosurgery  ENDOCRINE No results found for this basename: GLUCAP:5 in the last 168 hours A:  Hyperglycemia, resolved P:   -  No intervention required  NEUROLOGIC  Head CT:   5/16 >>> Diffuse SAH with ventriculomegaly 5/20 >>> Normal size ventricles 5/21 >>> Residual intraventricular hemorrhage, redevelopment of hydrocephalus  Angio: 5/16 >>> Aneurism coiled, 3 remain TCD: 5/20 >>> Mild vasospasm, elevated ICP  A:  SAH, nontraumatic, s/p ventriculostomy (replaced 5/21).  Hydrocephalus. Multiple aneurisms, 1 of 4 coiled. P:   - Per NS.  Rush Farmer, M.D. [facility] Pulmonary/Critical Care Medicine. Pager: [phone]. After hours pager: [phone].

## 2012-03-17 DIAGNOSIS — I609 Nontraumatic subarachnoid hemorrhage, unspecified: Secondary | ICD-10-CM

## 2012-03-17 LAB — BASIC METABOLIC PANEL
BUN: 25 mg/dL — ABNORMAL HIGH (ref 6–23)
Calcium: 9.3 mg/dL (ref 8.4–10.5)
Creatinine, Ser: 1.21 mg/dL — ABNORMAL HIGH (ref 0.50–1.10)
GFR calc non Af Amer: 51 mL/min — ABNORMAL LOW (ref >90)
Glucose, Bld: 132 mg/dL — ABNORMAL HIGH (ref 70–99)
Sodium: 139 mEq/L (ref 135–145)

## 2012-03-17 LAB — CBC
Hemoglobin: 11.7 g/dL — ABNORMAL LOW (ref 12.0–15.0)
MCH: 29.6 pg (ref 26.0–34.0)
MCHC: 34.4 g/dL (ref 30.0–36.0)
MCV: 86.1 fL (ref 78.0–100.0)

## 2012-03-17 MED ORDER — SODIUM CHLORIDE 0.9 % IV SOLN
INTRAVENOUS | Status: DC
  Administered 2012-03-17 – 2012-03-18 (×2): 20 mL/h via INTRAVENOUS
  Administered 2012-03-21: 21:00:00 via INTRAVENOUS

## 2012-03-17 MED ORDER — HYDRALAZINE HCL 20 MG/ML IJ SOLN
10.0000 mg | INTRAMUSCULAR | Status: DC | PRN
  Administered 2012-03-18 – 2012-03-19 (×3): 10 mg via INTRAVENOUS
  Administered 2012-03-19 – 2012-03-25 (×6): 20 mg via INTRAVENOUS
  Administered 2012-03-25: 40 mg via INTRAVENOUS
  Administered 2012-03-25 – 2012-03-27 (×3): 20 mg via INTRAVENOUS
  Administered 2012-03-29: 40 mg via INTRAVENOUS
  Administered 2012-03-30 – 2012-04-01 (×5): 20 mg via INTRAVENOUS
  Administered 2012-04-02: 40 mg via INTRAVENOUS
  Administered 2012-04-02: 20 mg via INTRAVENOUS
  Administered 2012-04-03: 10 mg via INTRAVENOUS
  Administered 2012-04-04: 20 mg via INTRAVENOUS
  Filled 2012-03-17: qty 1
  Filled 2012-03-17: qty 2
  Filled 2012-03-17: qty 1
  Filled 2012-03-17: qty 2
  Filled 2012-03-17 (×10): qty 1
  Filled 2012-03-17: qty 2
  Filled 2012-03-17: qty 1
  Filled 2012-03-17: qty 2
  Filled 2012-03-17: qty 1
  Filled 2012-03-17: qty 2
  Filled 2012-03-17 (×3): qty 1
  Filled 2012-03-17: qty 2
  Filled 2012-03-17: qty 1

## 2012-03-17 MED ORDER — LABETALOL HCL 300 MG PO TABS
300.0000 mg | ORAL_TABLET | Freq: Three times a day (TID) | ORAL | Status: DC
  Administered 2012-03-17 – 2012-04-01 (×34): 300 mg via ORAL
  Filled 2012-03-17 (×47): qty 1

## 2012-03-17 MED ORDER — ENSURE COMPLETE PO LIQD
237.0000 mL | Freq: Four times a day (QID) | ORAL | Status: DC
  Administered 2012-03-17 – 2012-03-29 (×30): 237 mL via ORAL
  Filled 2012-03-17 (×3): qty 237

## 2012-03-17 NOTE — Progress Notes (Signed)
  At 1445, pt noted to have R pupil > L. R=4, and L=3. Pt lethargic, opens eyes to command, MAE to commands, but no verbal responses.  BP 180/87. Dr. Christella Noa notified. No new orders at this time.   Will continue to monitor.

## 2012-03-17 NOTE — Progress Notes (Signed)
Name: Jonet Hausler MRN: [id number] DOB: August 05, 1961    LOS: 19  PULMONARY / CRITICAL CARE MEDICINE  Brief patient description:  51 yo with hx of HTN and previous ICH who presented to [facility] ED on 5/15 with severe hypertension and acute SAH.  Ventriculostomy placed.  Events Since Admission: 5/15  Presented to [facility] ED hypertensive with nausea, vomiting, headache and was found to have [place]. Intubated.  Ventriculostomy placed. 5/16  Extubated  5/17  Some hypoxia, off nicardipine drip now 5/18  Variable O2 needs, pcxr edema, tachy, trop neg 5/19  Pulled out ventriculostomy catheter, episodes of tachycardia 5/20  Briefly required Cardene, ventricular drain replaced 5/28  Back on nicardipine  Interval history:   Awake and follows commands. Poorly oriented. RN reports poor PO intake  Vital Signs: Temp:  [98.2 F (36.8 C)-99.3 F (37.4 C)] 99.1 F (37.3 C) (05/29 1200) Pulse Rate:  [68-85] 77  (05/29 1230) Resp:  [12-30] 21  (05/29 1230) BP: (113-186)/(54-96) 143/77 mmHg (05/29 1230) SpO2:  [95 %-100 %] 97 % (05/29 1230)  Physical Examination: General:  Comfortable, in no distress Neuro:  Nonfocal, awake, alert HEENT:  Ventriculostomy in place, draining well, blood tinged CSF Neck: No JVD Cardiovascular:  RRR, no murmurs Lungs:  Clear anteriorly Abdomen:  Soft, nontender, nondistended, bowel sounds present Musculoskeletal:  Moves all extremities, no pedal edema Skin:  No rash  Principal Problem:  *SAH (subarachnoid hemorrhage) Active Problems:  Severe hypertension  DIASTOLIC HEART FAILURE, CHRONIC  Acidosis  CAD, NATIVE VESSEL  CHRONIC KIDNEY DISEASE UNSPECIFIED  Acute respiratory failure  ASSESSMENT AND PLAN  PULMONARY   ETT:  5/15 >>>5/16.  CXR:  No new film A:  Acute respiratory failure - resolved.  Recurrent  pulmonary edema on the background of diastolic CHF and IVF.  P:   - Cont Supplemental oxygen, goal SpO2 > 92% - Incentive spirometry. - D/C IVFs to avoid  recurrent plum edema  CARDIOVASCULAR  TTE:  5/18 >>> Grade 2 diastolic disease, IVC dilated , EF 55%  A: Hypertensive emergency - resolved.   Recurrent severe hypertensive.   History of CAD, CHF (diastolic).  SVT / sinus tachycardia. P:  - Cont clonidine 0.3 mg PO q8h - Increase Labetalol to 300 mg PO q8h, with holding parameters for SBP of 150 or HR of 60. - Cont hydralazine 50 mg PO q8h plus add PRN IV hydralazine. - Goal SBP < 180 - Wean nicardipine to off - D/C Nicoderm patch 5/26  RENAL  Lab 03/17/12 0330 03/15/12 0410 03/14/12 0400 03/13/12 0439 03/11/12 0345  NA 139 135 135 137 137  K 3.6 4.7 -- -- --  CL 105 107 108 106 106  CO2 20 16* 16* 17* 17*  BUN 25* 31* 35* 40* 28*  CREATININE 1.21* 1.59* 1.70* 2.00* 1.61*  CALCIUM 9.3 9.0 8.6 9.2 8.9  MG 2.0 -- -- -- --  PHOS 4.2 -- -- -- --   Intake/Output      05/28 0701 - 05/29 0700 05/29 0701 - 05/30 0700   P.O. 630    I.V. (mL/kg) 2196.3 (28.9) 432.8 (5.7)   IV Piggyback 555 555   Total Intake(mL/kg) 3381.3 (44.5) 987.8 (13)   Urine (mL/kg/hr) 2880 (1.6) 500 (1.1)   Drains 267 64   Total Output 3147 564   Net +234.3 +423.8        Urine Occurrence 1 x 1 x   Stool Occurrence 1 x     Foley:  5/25 >>>  A:  Acute on chronic renal failure, improving.   NAG acidosis P:   - D/C HCO3 gtt - BMP in AM  GASTROINTESTINAL A:  No active issues P:   - Cont diet - SLP to see and address any ongoing swallowing issues  HEMATOLOGIC A:  No active issues P:  -  No intervention required  INFECTIOUS  Lab 03/17/12 0330 03/15/12 0410 03/14/12 0400  WBC 12.2* 11.4* 10.4  PROCALCITON -- -- --   Cultures: NA Antibiotics: Ancef 5/15  (per Neurosurgery) >>> 5/27 Vanc (per NS) 5/27 >>  Moxifloxacin (per NS) 5/27 >>   A:  No definite evidence of infection.  Fever, likely CNS origin.  P:   - Monitor fever curve - ABx per neurosurgery  ENDOCRINE A:  Hyperglycemia, resolved P:   -  No intervention  required  NEUROLOGIC Head CT:   5/16 >>> Diffuse SAH with ventriculomegaly 5/20 >>> Normal size ventricles 5/21 >>> Residual intraventricular hemorrhage, redevelopment of hydrocephalus 5/26 >> Moderate ventricular enlargement despite apparent satisfactory placement of intraventricular drainage catheter consistent with post subarachnoid hemorrhage hydrocephalus  Angio: 5/16 >>> Aneurysm coiled, 3 remain TCD: 5/20 >>> Mild vasospasm, elevated ICP  A:  SAH, nontraumatic, s/p ventriculostomy (replaced 5/21).  Hydrocephalus. Multiple aneurisms, 1 of 4 coiled. P:   - Mgmt per NS.   Merton Border, MD;  PCCM service; [place] [phone]

## 2012-03-17 NOTE — Evaluation (Signed)
Clinical/Bedside Swallow Evaluation Patient Details  Name: [name] MRN: [id number] Date of Birth: 02/16/1961  Today's Date: 03/17/2012 Time: 1208-1223 SLP Time Calculation (min): 15 min  Past Medical History:  Past Medical History  Diagnosis Date  . HTN (hypertension)     resistant  . Hypercholesterolemia   . Tobacco abuse     resumed smoking half a pack a day. She was a smoker in the past and states she was abl eto stop in the past using nicotine patches  . CAD (coronary artery disease)     Pt has St elevation MI in Feb 2009. Left hear tcath at tha ttime showed a 70% distal LAD lesion that was hazy consistent with a plaque rupture. She had a 50% distal circumflex stenosis an a 95% distal RCA stenosis. She did havve drug eluting stent placed in her distal RCA  . Chronic kidney disease     most recent creatinine 1.4 in 3/10  . Intracranial hemorrhage, spontaneous intraparenchymal, idiopathic, remote, resolved   . Diastolic heart failure     pt most recent echo wasin Feb 2009 w an EF of 60% and severe left ventricular hypertrophy. The pt did have evidence of diastolic dysfunction. The RV appeared normal   Past Surgical History: History reviewed. No pertinent past surgical history. HPI:  51 y.o. 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 ETT 5/15-5/16. She still has 3 untreated aneurysms which do need treatment per Dr. Christella Noa.  Initial clinical swallow eval completed on 03/05/12, revealing normal oropharyngeal swallow function with recs to begin regular diet/thin liquids.  During the last several days, pt's sister and sitter have noted increased pocketing of solids and decreased PO intake - repeat swallow eval ordered to assess changes in function.   Assessment / Plan / Recommendation Clinical Impression  Pt difficult to arouse sufficiently for  thorough assessment - consumed thin liquids and purees with no evidence of pharyngeal deficits and adequate airway protection, but holding POs orally given decreased MS.  Sister tearful, stating pt is eating less and pocketing foods.  Recommend downgrading diet to Dysphagia 3; continue thin liquids; feed only when alert and attentive to POs. Will follow briefly to ensure safety with intake.  Discussed with RN, who states level of alertness has been fluctuating.           Diet Recommendation Dysphagia 3 (Mechanical Soft);Thin liquid   Other  Recommendations     Follow Up Recommendations   (tba)    Frequency and Duration min 2x/week  1 week        SLP Swallow Goals Patient will utilize recommended strategies during swallow to increase swallowing safety with: Supervision/safety  Viraj Liby L. Tivis Ringer, [place] CCC/SLP Pager [phone]   [name] 03/17/2012,12:42 PM

## 2012-03-17 NOTE — Progress Notes (Signed)
Nutrition Follow-up  Diet Order:  Regular Spoke with sitter and sister who report that pt has been pocketing food and continues to not eat well. Per sitter pt has been drinking fluids including her ensure. Per RN pt only drank 1/4 of her ensure this morning.  SLP evaluation pending.  Pt has been without adequate nutrition despite interventions for 14 days. Pt remains in ICU due to HTN management.  Last bm 5/29. Pt still has ventricular catheter in, per MD notes draining well.    Meds: Scheduled Meds:   . cloNIDine  0.3 mg Oral Q8H  . feeding supplement  237 mL Oral BID BM  . hydrALAZINE  50 mg Oral Q8H  . labetalol  300 mg Oral TID  . levetiracetam  500 mg Intravenous Q12H  . moxifloxacin  400 mg Intravenous Q24H  . niMODipine  60 mg Oral Q4H  . pantoprazole  40 mg Oral QHS  . senna-docusate  1 tablet Oral BID  . vancomycin  1,000 mg Intravenous Q24H  . DISCONTD: atropine  1 mg Intravenous Once  . DISCONTD: labetalol  200 mg Oral TID   Continuous Infusions:   . sodium chloride 20 mL/hr at 03/17/12 1100  . niCARDipine 2.5 mg/hr (03/17/12 1200)  . DISCONTD: diltiazem (CARDIZEM) infusion Stopped (03/15/12 1115)  . DISCONTD:  sodium bicarbonate infusion 1000 mL Stopped (03/17/12 1000)   PRN Meds:.acetaminophen, acetaminophen, acetaminophen, hydrALAZINE, morphine injection, ondansetron (ZOFRAN) IV, DISCONTD: hydrALAZINE  Labs:  CMP     Component Value Date/Time   NA 139 03/17/2012 0330   K 3.6 03/17/2012 0330   CL 105 03/17/2012 0330   CO2 20 03/17/2012 0330   GLUCOSE 132* 03/17/2012 0330   BUN 25* 03/17/2012 0330   CREATININE 1.21* 03/17/2012 0330   CALCIUM 9.3 03/17/2012 0330   PROT 7.0 03/08/2012 0425   ALBUMIN 2.8* 03/08/2012 0425   AST 10 03/08/2012 0425   ALT <5 03/08/2012 0425   ALKPHOS 74 03/08/2012 0425   BILITOT 0.3 03/08/2012 0425   GFRNONAA 51* 03/17/2012 0330   GFRAA 59* 03/17/2012 0330  CBG (last 3)  No results found for this basename: GLUCAP:3 in the last 72 hours Lab  Results  Component Value Date   CALCIUM 9.3 03/17/2012   PHOS 4.2 03/17/2012   Magnesium  Date/Time Value Range Status  03/17/2012  3:30 AM 2.0  1.5-2.5 (mg/dL) Final    Intake/Output Summary (Last 24 hours) at 03/17/12 1213 Last data filed at 03/17/12 1200  Gross per 24 hour  Intake   3599 ml  Output   3105 ml  Net    494 ml    Weight Status:  Variable due to fluid 167 lbs 5/27 179 lbs 5/24  Estimated needs: Kcal: 1700-1900; Protein: 80-90 grams; Fluid: >1.7 L/day   Nutrition Dx: Inadequate oral intake now r/t lethargy AEB intake of < 50% of her meals; ongoing   Goal: Provide >/= 90% of her estimated needs; not met.   Intervention:  Continue Magic cup with meals  Increase Ensure Complete to QID, chocolate Await SLP evaluation, if pt continues to be unable to take adequate po intake recommend starting short term enteral TF, Jevity 1.2 @ 20 ml/hr and increase by 10 ml every 4 hours to goal rate of 60 ml/hr (1728 kcal, 80 grams protein, 1166 ml H2O)  Monitor:  SLP evaluation, po intake  Artur Winningham, [place] Pager #:  [phone]

## 2012-03-17 NOTE — Progress Notes (Signed)
VASCULAR LAB PRELIMINARY  PRELIMINARY  PRELIMINARY  PRELIMINARY  TCD completed.    Preliminary report:  TCD results can be found with results chart on 03/08/12  Toma Copier D, RVS 03/17/2012, 11:40 AM

## 2012-03-18 MED ORDER — HYDROCODONE-ACETAMINOPHEN 7.5-325 MG PO TABS: 1.0000 | ORAL_TABLET | ORAL | Status: DC | PRN

## 2012-03-18 MED ORDER — HYDRALAZINE HCL 50 MG PO TABS
75.0000 mg | ORAL_TABLET | Freq: Three times a day (TID) | ORAL | Status: DC
  Administered 2012-03-18 – 2012-03-29 (×22): 75 mg via ORAL
  Filled 2012-03-18 (×37): qty 1

## 2012-03-18 MED ORDER — HYDROCODONE-ACETAMINOPHEN 10-325 MG PO TABS
1.0000 | ORAL_TABLET | ORAL | Status: DC | PRN
  Administered 2012-03-19 – 2012-03-24 (×11): 1 via ORAL
  Filled 2012-03-18 (×12): qty 1

## 2012-03-18 NOTE — Progress Notes (Signed)
Patient ID: [name], female   DOB: 1961/01/13, 51 y.o.   MRN: [id number] BP 127/68  Pulse 84  Temp(Src) 100.4 F (38 C) (Axillary)  Resp 26  Ht 5\' 1"  (1.549 m)  Wt 76 kg (167 lb 8.8 oz)  BMI 31.66 kg/m2  SpO2 94% Alert , follows some commands.  Perrl (possible pupillary inequality), full eom.  Symmetric facies Ventricular catheter is draining dark reddish fluid. Moving all extremities. No change in her exam. Definitely needs the ventric. Slow improvement.

## 2012-03-18 NOTE — Progress Notes (Signed)
Speech Language Pathology Treatment Patient Details Name: [name] MRN: [id number] DOB: May 15, 1961 Today's Date: 03/18/2012 Time: [id number] SLP Time Calculation (min): 7 min  Assessment / Plan / Recommendation Clinical Impression  Pt lethargic; apparently with improved alertness this am, but only consumed minimal PO.  Unable to arouse sufficiently for PO consumption this afternoon despite max verbal/tactile cues, wet washcloth to face, verbal prompts.  REC:  continue current Dysphagia 3/thin liquids when alert.    SLP Plan  Continue with current plan of care    Pertinent Vitals/Pain Appears comfortable  SLP Goals continue plan: unable to progress today   General Temperature Spikes Noted: No Respiratory Status: Room air Behavior/Cognition: Lethargic Oral Cavity - Dentition: Adequate natural dentition Patient Positioning: Upright in bed  Oral Cavity - Oral Hygiene:  wnl     Treatment Treatment focused on:  (dysphagia) Skilled Treatment: Pt lethargic; apparently with improved alertness this am, but only consumed minimal PO.  Unable to arouse sufficiently for PO consumption this afternoon despite max verbal/tactile cues, wet washcloth to face, verbal prompts.  REC:  continue current Dysphagia 3/thin liquids when alert.   [name], [place] CCC/SLP Pager [phone] [name] 03/18/2012, 2:04 PM

## 2012-03-18 NOTE — Progress Notes (Signed)
Name: Leinani Urbanski MRN: [id number] DOB: 26-Dec-1960    LOS: 42  PULMONARY / CRITICAL CARE MEDICINE  Brief patient description:  51 yo with hx of HTN and previous ICH who presented to [facility] ED on 5/15 with severe hypertension and acute SAH.  Ventriculostomy placed.  Events Since Admission: 5/15  Presented to [facility] ED hypertensive with nausea, vomiting, headache and was found to have [place]. Intubated.  Ventriculostomy placed. 5/16  Extubated  5/17  Some hypoxia, off nicardipine drip now 5/18  Variable O2 needs, pcxr edema, tachy, trop neg 5/19  Pulled out ventriculostomy catheter, episodes of tachycardia 5/20  Briefly required Cardene, ventricular drain replaced 5/28  Back on nicardipine  Interval history:   No distress  Vital Signs: Temp:  [98 F (36.7 C)-99.7 F (37.6 C)] 98.7 F (37.1 C) (05/30 0400) Pulse Rate:  [65-90] 69  (05/30 0900) Resp:  [16-26] 23  (05/30 0900) BP: (131-198)/(52-113) 135/54 mmHg (05/30 0900) SpO2:  [96 %-100 %] 96 % (05/30 0900)  Physical Examination: General:  Comfortable, in no distress Neuro:  Nonfocal, awake, alert HEENT:  Ventriculostomy in place, draining well, amber CSF Neck: No JVD, no line neck Cardiovascular:  RRR, no murmurs Lungs:  Clear anteriorly Abdomen:  Soft, nontender, nondistended, bowel sounds present Musculoskeletal:  Moves all extremities, no pedal edema Skin:  No rash  Principal Problem:  *SAH (subarachnoid hemorrhage) Active Problems:  CAD, NATIVE VESSEL  DIASTOLIC HEART FAILURE, CHRONIC  CHRONIC KIDNEY DISEASE UNSPECIFIED  Acute respiratory failure  Acidosis  Severe hypertension  ASSESSMENT AND PLAN  PULMONARY   ETT:  5/15 >>>5/16.  CXR:  No new film A:  Acute respiratory failure - resolved.  Recurrent  pulmonary edema on the background of diastolic CHF and IVF.  P:   - Cont Supplemental oxygen, goal SpO2 > 92% - Incentive spirometry. - D/C IVFs to avoid recurrent plum edema No new  films  CARDIOVASCULAR  TTE:  5/18 >>> Grade 2 diastolic disease, IVC dilated , EF 55%  A: Hypertensive emergency - resolved.   Recurrent severe hypertensive.   History of CAD, CHF (diastolic).  SVT / sinus tachycardia. P:  - Cont clonidine 0.3 mg PO q8h - Increase Labetalol to 300 mg PO q8h, with holding parameters for SBP of 150 or HR of 60. - Cont hydralazine 50 mg PO q8h, increase as bradycardia times -PRN IV hydralazine. - Goal SBP < 180 - Wean nicardipine to off - D/C Nicoderm patch 5/26  RENAL  Lab 03/17/12 0330 03/15/12 0410 03/14/12 0400 03/13/12 0439  NA 139 135 135 137  K 3.6 4.7 -- --  CL 105 107 108 106  CO2 20 16* 16* 17*  BUN 25* 31* 35* 40*  CREATININE 1.21* 1.59* 1.70* 2.00*  CALCIUM 9.3 9.0 8.6 9.2  MG 2.0 -- -- --  PHOS 4.2 -- -- --   Intake/Output      05/29 0701 - 05/30 0700 05/30 0701 - 05/31 0700   P.O.     I.V. (mL/kg) 816.5 (10.7) 20 (0.3)   IV Piggyback 660    Total Intake(mL/kg) 1476.5 (19.4) 20 (0.3)   Urine (mL/kg/hr) 600 (0.3) 0   Drains 285 16   Total Output 885 16   Net +591.5 +4        Urine Occurrence 5 x 1 x    Foley:  5/25 >>>  A:  Acute on chronic renal failure, improving.   NAG acidosis P:   - D/C HCO3 gtt - BMP limit  when able , Na wnl -essentially even  GASTROINTESTINAL A:  No active issues P:   - Cont diet - SLP  seen  HEMATOLOGIC A:  No active issues P:  -  No intervention required -scd  INFECTIOUS  Lab 03/17/12 0330 03/15/12 0410 03/14/12 0400  WBC 12.2* 11.4* 10.4  PROCALCITON -- -- --   Cultures: NA Antibiotics: Ancef 5/15  (per Neurosurgery) >>> 5/27 Vanc (per NS) 5/27 >>  Moxifloxacin (per NS) 5/27 >>   A:  No definite evidence of infection.  Fever, likely CNS origin.  P:   - Monitor fever curve - ABx per neurosurgery -low threshold change Avalox to pseudomonal coverage; if needed could challeng e with ceftaz if worsen  ENDOCRINE A:  Hyperglycemia, resolved P:   -  No intervention  required  NEUROLOGIC Head CT:   5/16 >>> Diffuse SAH with ventriculomegaly 5/20 >>> Normal size ventricles 5/21 >>> Residual intraventricular hemorrhage, redevelopment of hydrocephalus 5/26 >> Moderate ventricular enlargement despite apparent satisfactory placement of intraventricular drainage catheter consistent with post subarachnoid hemorrhage hydrocephalus  Angio: 5/16 >>> Aneurysm coiled, 3 remain TCD: 5/20 >>> Mild vasospasm, elevated ICP tcd 5/27>>>Normal mean flow velocities in majority of identified vessels without evidence of vasospasm.   A:  SAH, nontraumatic, s/p ventriculostomy (replaced 5/21).  Hydrocephalus. Multiple aneurisms, 1 of 4 coiled. P:   - TCD reviewed,normal, at this stage day 15 post coil, consider dc further tcd nimodione -did have even essential balance   Lavon Paganini. Titus Mould, MD, [place] Pgr: [facility]

## 2012-03-18 NOTE — Progress Notes (Signed)
Nutrition Follow-up  Diet Order:  Dysphagia 3/Thin Pt seen by SLP 5/29 and is now on dysphagia diet. Spoke with RN this morning and pt continues to eat very poorly < 10% of her meals but did drink an ensure this morning with a lot of encouragement. Last bm 5/29, small.  Meds: Scheduled Meds:   . cloNIDine  0.3 mg Oral Q8H  . feeding supplement  237 mL Oral QID  . hydrALAZINE  75 mg Oral Q8H  . labetalol  300 mg Oral TID  . levetiracetam  500 mg Intravenous Q12H  . moxifloxacin  400 mg Intravenous Q24H  . niMODipine  60 mg Oral Q4H  . pantoprazole  40 mg Oral QHS  . senna-docusate  1 tablet Oral BID  . vancomycin  1,000 mg Intravenous Q24H  . DISCONTD: hydrALAZINE  50 mg Oral Q8H   Continuous Infusions:   . sodium chloride 20 mL/hr at 03/18/12 1200  . niCARDipine Stopped (03/17/12 1403)   PRN Meds:.acetaminophen, acetaminophen, acetaminophen, hydrALAZINE, morphine injection, ondansetron (ZOFRAN) IV  Labs:  CMP     Component Value Date/Time   NA 139 03/17/2012 0330   K 3.6 03/17/2012 0330   CL 105 03/17/2012 0330   CO2 20 03/17/2012 0330   GLUCOSE 132* 03/17/2012 0330   BUN 25* 03/17/2012 0330   CREATININE 1.21* 03/17/2012 0330   CALCIUM 9.3 03/17/2012 0330   PROT 7.0 03/08/2012 0425   ALBUMIN 2.8* 03/08/2012 0425   AST 10 03/08/2012 0425   ALT <5 03/08/2012 0425   ALKPHOS 74 03/08/2012 0425   BILITOT 0.3 03/08/2012 0425   GFRNONAA 51* 03/17/2012 0330   GFRAA 59* 03/17/2012 0330  CBG (last 3)  No results found for this basename: GLUCAP:3 in the last 72 hours   Intake/Output Summary (Last 24 hours) at 03/18/12 1329 Last data filed at 03/18/12 1200  Gross per 24 hour  Intake 568.75 ml  Output    339 ml  Net 229.75 ml    Weight Status:  No new weight 167 lbs 5/27  Estimated needs: Kcal: 1700-1900; Protein: 80-90 grams; Fluid: >1.7 L/day   Nutrition Dx: Inadequate oral intake now r/t lethargy AEB intake of < 50% of her meals; ongoing  Goal: Provide >/= 90% of her estimated  needs; not met.   Intervention:  Continue Magic cup with meals  Increase Ensure Complete to QID, chocolate If pt unable to consume ensure as ordered, recommend short term enteral nutrition support to prevent malnutrition in pt with inadequate intake >15 days. Would start Jevity 1.2 @ 20 ml/hr and increase by 10 ml every 4 hours to goal rate of 60 ml/hr (1728 kcal, 80 grams protein, 1166 ml H2O)  Monitor:  Po intake  [name], [place] Pager #:  [phone]

## 2012-03-18 NOTE — Progress Notes (Signed)
Patient ID: [name], female   DOB: 07-12-1961, 51 y.o.   MRN: [id number] BP 161/71  Pulse 72  Temp(Src) 98 F (36.7 C) (Oral)  Resp 21  Ht 5\' 1"  (1.549 m)  Wt 76 kg (167 lb 8.8 oz)  BMI 31.66 kg/m2  SpO2 96% Alert and oriented to person Follows commands Moving all extremities Pupils slightly unequal briskly reactive bilaterally Exam stable Bp better controlled

## 2012-03-19 LAB — VANCOMYCIN, TROUGH: Vancomycin Tr: 14.1 ug/mL (ref 10.0–20.0)

## 2012-03-19 MED ORDER — SODIUM CHLORIDE 0.9 % IV SOLN
1250.0000 mg | INTRAVENOUS | Status: DC
  Administered 2012-03-20 – 2012-03-23 (×4): 1250 mg via INTRAVENOUS
  Filled 2012-03-19 (×4): qty 1250

## 2012-03-19 NOTE — Progress Notes (Signed)
  VASCULAR LAB PRELIMINARY  PRELIMINARY  PRELIMINARY  PRELIMINARY     [name], 03/19/2012, 6:03 PM     See TCD results on noted from 5./20/2013   [name]  RVT

## 2012-03-19 NOTE — Progress Notes (Signed)
ANTIBIOTIC CONSULT NOTE - FOLLOW UP  Pharmacy Consult for vancomycin Indication: empiric  Allergies  Allergen Reactions  . Ampicillin Other (See Comments)    unknown  . Penicillins Other (See Comments)    unknown    Patient Measurements: Height: 5\' 1"  (154.9 cm) Weight: 167 lb 8.8 oz (76 kg) IBW/kg (Calculated) : 47.8   Vital Signs: Temp: 98.5 F (36.9 C) (05/31 0700) Temp src: Oral (05/31 0700) BP: 170/88 mmHg (05/31 1200) Pulse Rate: 79  (05/31 1200) Intake/Output from previous day: 05/30 0701 - 05/31 0700 In: 585 [I.V.:480; IV Piggyback:105] Out: 285 [Drains:285] Intake/Output from this shift: Total I/O In: 100 [I.V.:100] Out: 462 [Urine:425; Drains:37]  Labs:  [facility] 03/17/12 0330  WBC 12.2*  HGB 11.7*  PLT 455*  LABCREA --  CREATININE 1.21*   Estimated Creatinine Clearance: 51.9 ml/min (by C-G formula based on Cr of 1.21).  Basename 03/19/12 0930  VANCOTROUGH 14.1  VANCOPEAK --  Jake Michaelis --  GENTTROUGH --  GENTPEAK --  GENTRANDOM --  TOBRATROUGH --  TOBRAPEAK --  TOBRARND --  AMIKACINPEAK --  AMIKACINTROU --  AMIKACIN --     Microbiology: Recent Results (from the past 720 hour(s))  MRSA PCR SCREENING     Status: Normal   Collection Time   03/03/12 10:20 PM      Component Value Range Status Comment   MRSA by PCR NEGATIVE  NEGATIVE  Final   CULTURE, BLOOD (ROUTINE X 2)     Status: Normal   Collection Time   03/08/12  4:43 PM      Component Value Range Status Comment   Specimen Description BLOOD HAND RIGHT   Final    Special Requests BOTTLES DRAWN AEROBIC AND ANAEROBIC 10CC   Final    Culture  Setup Time [id number]   Final    Culture NO GROWTH 5 DAYS   Final    Report Status 03/15/2012 FINAL   Final   CULTURE, BLOOD (ROUTINE X 2)     Status: Normal   Collection Time   03/08/12  4:47 PM      Component Value Range Status Comment   Specimen Description BLOOD ARM RIGHT   Final    Special Requests BOTTLES DRAWN AEROBIC AND ANAEROBIC  10CC   Final    Culture  Setup Time [id number]   Final    Culture NO GROWTH 5 DAYS   Final    Report Status 03/15/2012 FINAL   Final   CSF CULTURE     Status: Normal   Collection Time   03/13/12 12:10 PM      Component Value Range Status Comment   Specimen Description CSF   Final    Special Requests NONE   Final    Gram Stain     Final    Value: CYTOSPIN SLIDE WBC PRESENT, PREDOMINANTLY PMN     NO ORGANISMS SEEN     Performed at [facility]   Culture NO GROWTH 3 DAYS   Final    Report Status 03/16/2012 FINAL   Final   GRAM STAIN     Status: Normal   Collection Time   03/13/12 12:10 PM      Component Value Range Status Comment   Specimen Description CSF   Final    Special Requests NONE   Final    Gram Stain     Final    Value: WBC PRESENT, PREDOMINANTLY PMN     NO ORGANISMS SEEN     CYTOSPIN  SLIDE   Report Status 03/13/2012 FINAL   Final     Anti-infectives     Start     Dose/Rate Route Frequency Ordered Stop   03/15/12 1200   moxifloxacin (AVELOX) IVPB 400 mg        400 mg 250 mL/hr over 60 Minutes Intravenous Every 24 hours 03/15/12 0833     03/15/12 1000   vancomycin (VANCOCIN) IVPB 1000 mg/200 mL premix  Status:  Discontinued        1,000 mg 200 mL/hr over 60 Minutes Intravenous Every 12 hours 03/15/12 0831 03/15/12 0856   03/15/12 1000   vancomycin (VANCOCIN) IVPB 1000 mg/200 mL premix        1,000 mg 200 mL/hr over 60 Minutes Intravenous Every 24 hours 03/15/12 0856     03/15/12 0845   cefTRIAXone (ROCEPHIN) 2 g in dextrose 5 % 50 mL IVPB  Status:  Discontinued        2 g 100 mL/hr over 30 Minutes Intravenous Every 24 hours 03/15/12 0831 03/15/12 0832   03/05/12 1800   ceFAZolin (ANCEF) IVPB 1 g/50 mL premix  Status:  Discontinued        1 g 100 mL/hr over 30 Minutes Intravenous Every 12 hours 03/05/12 1129 03/15/12 0831   03/03/12 2200   ceFAZolin (ANCEF) IVPB 1 g/50 mL premix  Status:  Discontinued        1 g 100 mL/hr over 30 Minutes Intravenous 3  times per day 03/03/12 2135 03/05/12 1129          Assessment: 69 yof on empiric vancomycin + moxifloxacin. All cultures are negative and repeated MD notes state no definite infection. Pt fever improved and all cultures are negative. Today is day #5 of vanc + moxi therapy. Vanc trough today is slightly low at 14.1.   Goal of Therapy:  Vancomycin trough level 15-20 mcg/ml  Plan:  1. Increase vancomycin to 1250mg  IV Q24H 2. Please clarify length of therapy 3. Check another trough at [facility] if continued  Avani Sensabaugh, Rande Lawman 03/19/2012,12:28 PM

## 2012-03-19 NOTE — Progress Notes (Signed)
Name: [name] MRN: [id number] DOB: [birth year]    LOS: 44  PULMONARY / CRITICAL CARE MEDICINE  Brief patient description:  51 yo with hx of HTN and previous ICH who presented to [facility] ED on 5/15 with severe hypertension and acute SAH.  Ventriculostomy placed.  Events Since Admission: 5/15  Presented to [facility] ED hypertensive with nausea, vomiting, headache and was found to have [place]. Intubated.  Ventriculostomy placed. 5/16  Extubated  5/17  Some hypoxia, off nicardipine drip now 5/18  Variable O2 needs, pcxr edema, tachy, trop neg 5/19  Pulled out ventriculostomy catheter, episodes of tachycardia 5/20  Briefly required Cardene, ventricular drain replaced 5/28  Back on nicardipine  Interval history:   No distress. Continues to have episodic hypertension  Vital Signs: Temp:  [98.5 F (36.9 C)-100.4 F (38 C)] 98.5 F (36.9 C) (05/31 0700) Pulse Rate:  [63-85] 73  (05/31 1030) Resp:  [16-27] 26  (05/31 1030) BP: (112-173)/(57-128) 173/91 mmHg (05/31 1030) SpO2:  [88 %-100 %] 100 % (05/31 1030)  Physical Examination: General:  Comfortable, in no distress Neuro:  Nonfocal, awake, alert HEENT:  Ventriculostomy in place, draining well, amber CSF Neck: No JVD, no line neck Cardiovascular:  RRR, no murmurs Lungs:  Clear anteriorly Abdomen:  Soft, nontender, nondistended, bowel sounds present Musculoskeletal:  Moves all extremities, no pedal edema Skin:  No rash  Principal Problem:  *SAH (subarachnoid hemorrhage) Active Problems:  Severe hypertension  DIASTOLIC HEART FAILURE, CHRONIC  Acidosis  CAD, NATIVE VESSEL  CHRONIC KIDNEY DISEASE UNSPECIFIED  Acute respiratory failure  ASSESSMENT AND PLAN  PULMONARY   ETT:  5/15 >>>5/16.  CXR:  No new film A:  Acute respiratory failure - resolved.  Recurrent  pulmonary edema on the background of diastolic CHF and IVF.  P:   - Cont Supplemental oxygen, goal SpO2 > 92% - Incentive spirometry. - D/C IVFs to avoid recurrent plum  edema   CARDIOVASCULAR  TTE:  5/18 >>> Grade 2 diastolic disease, IVC dilated , EF 55%  A: Hypertensive emergency - resolved.   Recurrent severe hypertensive.   History of CAD, CHF (diastolic).  SVT / sinus tachycardia. P:  - Cont clonidine 0.3 mg PO q8h -Cont Labetalol to 300 mg PO q8h, with holding parameters for SBP of 150 or HR of 60. - Cont hydralazine 50 mg PO q8h, increase as bradycardia times - PRN IV hydralazine. - Goal SBP < 180   RENAL  Lab 03/17/12 0330 03/15/12 0410 03/14/12 0400 03/13/12 0439  NA 139 135 135 137  K 3.6 4.7 -- --  CL 105 107 108 106  CO2 20 16* 16* 17*  BUN 25* 31* 35* 40*  CREATININE 1.21* 1.59* 1.70* 2.00*  CALCIUM 9.3 9.0 8.6 9.2  MG 2.0 -- -- --  PHOS 4.2 -- -- --   Intake/Output      05/30 0701 - 05/31 0700 05/31 0701 - 06/01 0700   I.V. (mL/kg) 480 (6.3) 80 (1.1)   IV Piggyback 105    Total Intake(mL/kg) 585 (7.7) 80 (1.1)   Urine (mL/kg/hr)  425 (1.2)   Drains 285 37   Total Output 285 462   Net +300 -382        Urine Occurrence 4 x     Foley:  5/25 >>>  A:  Acute on chronic renal failure, improving.   NAG acidosis - improved P:   - Recheck BMET 6/1   GASTROINTESTINAL A:  No active issues P:   - Cont  diet - SLP  seen   HEMATOLOGIC A:  No active issues P:  - DVTp: SCDs    INFECTIOUS  Lab 03/17/12 0330 03/15/12 0410 03/14/12 0400  WBC 12.2* 11.4* 10.4  PROCALCITON -- -- --    Cultures: NA Antibiotics: Ancef 5/15  (per Neurosurgery) >>> 5/27 Vanc (per NS) 5/27 >>  Moxifloxacin (per NS) 5/27 >>   A:  No definite evidence of infection.  Fever improved 5/31 P:   - Monitor fever curve - ABx per neurosurgery   ENDOCRINE A:  Hyperglycemia, resolved P:   -  No intervention required   NEUROLOGIC Head CT:   5/16 >>> Diffuse SAH with ventriculomegaly 5/20 >>> Normal size ventricles 5/21 >>> Residual intraventricular hemorrhage, redevelopment of hydrocephalus 5/26 >> Moderate ventricular enlargement  despite apparent satisfactory placement of intraventricular drainage catheter consistent with post subarachnoid hemorrhage hydrocephalus Angio: 5/16 >>> Aneurysm coiled, 3 remain TCD:  5/20 >>> Mild vasospasm, elevated ICP   5/27>>>Normal mean flow velocities in majority of identified vessels without evidence of vasospasm.   A:  SAH, nontraumatic, s/p ventriculostomy (replaced 5/21).  Hydrocephalus. Multiple aneurisms, 1 of 4 coiled. P:   - TCD reviewed,normal, at this stage day 15 post coil, consider dc further tcd nimodione - did have even essential balance    PCCM will be available PRN this WE. Will see again Monday 6/3 or sooner if needed   Merton Border, MD;  PCCM service; Mobile [phone]

## 2012-03-19 NOTE — Care Management Note (Signed)
    Page 1 of 2   04/06/2012     10:36:19 AM   CARE MANAGEMENT NOTE 04/06/2012  Patient:  [name], [name]   Account Number:  [id number]  Date Initiated:  03/04/2012  Documentation initiated by:  [name]  Subjective/Objective Assessment:   Admitted with [facility]. Lives alone.     Action/[name]:   PT/OT evals  PT recommending inpatient rehab  OT recommending inpatient rehab   Anticipated DC Date:  04/04/2012   Anticipated DC [name]:  IP REHAB FACILITY  In-house referral  Clinical Social Worker      DC Planning Services  CM consult      Choice offered to / List presented to:             Status of service:  Completed, signed off Medicare Important Message given?   (If response is "NO", the following Medicare IM given date fields will be blank) Date Medicare IM given:   Date Additional Medicare IM given:    Discharge Disposition:  IP REHAB FACILITY  Per UR Regulation:  Reviewed for med. necessity/level of care/duration of stay  If discussed at [place] of Stay Meetings, dates discussed:   03/10/2012  03/17/2012    Comments:  04/05/12- 1100- Diane Gibbons RN, BSN [phone] [name] is for pt to d/c to CIR today if ok with Neuro MD. CIR has bed available and is ready to take pt today.   04/01/12 Patient being evaluated for inpatient rehab.CSW following for possible SNF. Will continue to follow for d/c needs.Diane Plan RN, BSN, CCM  03/29/12 Patient had shunt inserted on 03/27/12, remained intubted until today. PT/OT evals pending until patient stable enough to participate. Will continue to follow for d/c needs. Diane Plan RN, BSN, CCM   03/23/2012 Diane Mariscal, RN BSN [facility] CCM 1413--Pt remains in ICU with ventriculostomy in place. MD plans to recheck scan on Wednesday (tomorrow) and make decisions based on that.  03/19/12 Contacted [name] [phone] who is listed as patient's contact. [name] stated that he is patient's contact for d/c planning and can be contacted as  needed. He stated that patinet was living alone but has a daughter [name]. [name] stated to call him as needed. Diane Plan RN, BSN, CCM   03/17/12 Patient remains in ICU, confused, unable to participate with PT/OT due to medical condition. Will continue to follow for d/c needs.

## 2012-03-19 NOTE — Progress Notes (Signed)
Patient ID: [name] xxx Morimoto, female   DOB: 1960-11-22, 51 y.o.   MRN: [id number] BP 167/81  Pulse 81  Temp(Src) 99.3 F (37.4 C) (Oral)  Resp 22  Ht 5\' 1"  (1.549 m)  Wt 76 kg (167 lb 8.8 oz)  BMI 31.66 kg/m2  SpO2 100% Lethargic, arouses to voice. Following commands Pupils reactive to light. Full eom Symmetric facies. Tongue and uvula midline Moving all extremities.  IVC in place, draining well.  Will check CT tomorrow. TCD's unremarkable, velocities ok

## 2012-03-20 ENCOUNTER — Inpatient Hospital Stay (HOSPITAL_COMMUNITY): Payer: No Typology Code available for payment source

## 2012-03-20 MED ORDER — LEVETIRACETAM 500 MG PO TABS
500.0000 mg | ORAL_TABLET | Freq: Two times a day (BID) | ORAL | Status: DC
  Administered 2012-03-21 – 2012-03-24 (×8): 500 mg via ORAL
  Filled 2012-03-20 (×12): qty 1

## 2012-03-20 NOTE — Progress Notes (Signed)
BP 149/73  Pulse 78  Temp(Src) 98.8 F (37.1 C) (Oral)  Resp 14  Ht 5\' 1"  (1.549 m)  Wt 78.5 kg (173 lb 1 oz)  BMI 32.70 kg/m2  SpO2 97% Alert, oriented to person and place. Perrl, full eom ventric draining well.  Ct today

## 2012-03-21 MED ORDER — BIOTENE DRY MOUTH MT LIQD
15.0000 mL | Freq: Two times a day (BID) | OROMUCOSAL | Status: DC
  Administered 2012-03-21 – 2012-03-27 (×13): 15 mL via OROMUCOSAL

## 2012-03-21 NOTE — Progress Notes (Signed)
Hydralazine held at 1500 due to SBP of 119.  Labetalol held at 1600 due to SBP of 119.  Clonidine held at 1800 due to SBP of 119.  Will continue to monitor and update as needed.  Viona Gilmore [facility] 03/21/2012 6:29 PM

## 2012-03-21 NOTE — Progress Notes (Signed)
eLink Physician-Brief Progress Note Patient Name: [name] DOB: May 11, 1961 MRN: [id number]  Date of Service  03/21/2012   HPI/Events of Note  Hold clonidine due to sbp 119   eICU Interventions     Intervention Category Intermediate Interventions: Other:  Saphyre Cillo 03/21/2012, 6:25 PM

## 2012-03-21 NOTE — Progress Notes (Signed)
Subjective: Patient reports she is ok  Objective: Vital signs in last 24 hours: Temp:  [97.5 F (36.4 C)-99 F (37.2 C)] 99 F (37.2 C) (06/02 1200) Pulse Rate:  [56-76] 73  (06/02 1200) Resp:  [12-29] 20  (06/02 1200) BP: (110-187)/(58-89) 148/75 mmHg (06/02 1200) SpO2:  [95 %-100 %] 100 % (06/02 1200) Weight:  [75.5 kg (166 lb 7.2 oz)] 75.5 kg (166 lb 7.2 oz) (06/02 0400)  Intake/Output from previous day: 06/01 0701 - 06/02 0700 In: 1280 [P.O.:300; I.V.:480; IV Piggyback:500] Out: 238 [Drains:238] Intake/Output this shift: Total I/O In: 717 [P.O.:200; I.V.:100; IV Piggyback:417] Out: 69 [Drains:64]  alert, follows commands. could not tell me she was in a hospital Perrl, full eom Symmetric facies Tongue and uvula midline Speech clear, fluent Moves all limbs well   Lab Results: No results found for this basename: WBC:2,HGB:2,HCT:2,PLT:2 in the last 72 hours BMET No results found for this basename: NA:2,K:2,CL:2,CO2:2,GLUCOSE:2,BUN:2,CREATININE:2,CALCIUM:2 in the last 72 hours  Studies/Results: Ct Head Wo Contrast  03/20/2012  *RADIOLOGY REPORT*  Clinical Data: 51 year old female status post drain placement. History of ruptured cerebral aneurysm, subarachnoid hemorrhage, ventriculostomy placement.  CT HEAD WITHOUT CONTRAST  Technique:  Contiguous axial images were obtained from the base of the skull through the vertex without contrast.  Comparison: 03/14/2012 and earlier.  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  Aneurysm clip and coil pack appears stable in configuration.  Sequelae of right frontotemporal craniotomy. Right frontal burr hole placement. Stable visualized osseous structures.  Visualized orbits and scalp soft tissues are within normal limits.  Right external ventricular drain is stable in position terminating in the right lateral ventricle.  A small  volume of intraventricular hemorrhage has decreased.  Ventriculomegaly has not significantly changed.  Continued diffuse cerebral edema.  No acute cortically based infarct.  Stable basilar cisterns.  No midline shift.  IMPRESSION: 1.  Stable ventriculomegaly.  Right CVP placement appears stable. 2.  Decreased small volume of intraventricular hemorrhage.  No new intracranial hemorrhage. 3.  Continued diffuse cerebral edema.  Stable basilar cisterns. 4.  No new intracranial abnormality.  Original Report Authenticated By: Randall An, M.D.    Assessment/Plan: Lowered ventricular catheter yesterday due to ventricle size. Will leave at this height for now.   LOS: 18 days     Lysette Lindenbaum L 03/21/2012, 12:12 PM

## 2012-03-22 MED ORDER — POTASSIUM CHLORIDE 10 MEQ/50ML IV SOLN
INTRAVENOUS | Status: AC
  Filled 2012-03-22: qty 50

## 2012-03-22 NOTE — Progress Notes (Signed)
Nutrition Follow-up  Diet Order:  Dysphagia 3/Thin PO intake remains very poor. Pt only takes a couple of bites at meals. Per RN pt drank some of her ensure. Noted that SLP attempted to feed pt last Thursday but was too lethargic to eat. Family at bedside, pt seemed alert but confused. Encouraged pt to eat and drink her ensure. Per family pt does not like food here. Will attempt to assist pt with menu. Pt/family called me back in pt's room, pt requesting fried chicken.  Meds: Scheduled Meds:   . antiseptic oral rinse  15 mL Mouth Rinse BID  . cloNIDine  0.3 mg Oral Q8H  . feeding supplement  237 mL Oral QID  . hydrALAZINE  75 mg Oral Q8H  . labetalol  300 mg Oral TID  . levETIRAcetam  500 mg Oral BID  . moxifloxacin  400 mg Intravenous Q24H  . niMODipine  60 mg Oral Q4H  . pantoprazole  40 mg Oral QHS  . potassium chloride      . senna-docusate  1 tablet Oral BID  . vancomycin  1,250 mg Intravenous Q24H   Continuous Infusions:   . sodium chloride 20 mL/hr at 03/22/12 1000  . niCARDipine Stopped (03/17/12 1403)   PRN Meds:.acetaminophen, acetaminophen, acetaminophen, hydrALAZINE, HYDROcodone-acetaminophen, morphine injection, ondansetron (ZOFRAN) IV  Labs:  CMP     Component Value Date/Time   NA 139 03/17/2012 0330   K 3.6 03/17/2012 0330   CL 105 03/17/2012 0330   CO2 20 03/17/2012 0330   GLUCOSE 132* 03/17/2012 0330   BUN 25* 03/17/2012 0330   CREATININE 1.21* 03/17/2012 0330   CALCIUM 9.3 03/17/2012 0330   PROT 7.0 03/08/2012 0425   ALBUMIN 2.8* 03/08/2012 0425   AST 10 03/08/2012 0425   ALT <5 03/08/2012 0425   ALKPHOS 74 03/08/2012 0425   BILITOT 0.3 03/08/2012 0425   GFRNONAA 51* 03/17/2012 0330   GFRAA 59* 03/17/2012 0330  CBG (last 3)  No results found for this basename: GLUCAP:3 in the last 72 hours   Intake/Output Summary (Last 24 hours) at 03/22/12 1132 Last data filed at 03/22/12 1041  Gross per 24 hour  Intake   1520 ml  Output   1417 ml  Net    103 ml    Weight  Status:   166 lbs 6/3 166 lbs 6/2 173 lbs 6/1  Estimated needs: Kcal: 1700-1900; Protein: 80-90 grams; Fluid: >1.7 L/day   Nutrition Dx: Inadequate oral intake now r/t lethargy AEB intake of < 50% of her meals; ongoing   Goal: Provide >/= 90% of her estimated needs; not met.   Intervention:  Continue Magic cup with meals  Continue Ensure Complete to QID, chocolate  Consider adding appetite stimulant to help increase intake Assist pt with menu selections to help improve intake.  Monitor: Po intake  Cenia Zaragosa, [place] Pager #:  [phone]

## 2012-03-22 NOTE — Progress Notes (Signed)
Clinical Education officer, museum met with pt, pt's dtr Evlyn Clines), and pt's sister [name]) at bedside. CSW reviewed HCPOA regulations with family.  CSW explained that since pt is not legally married, the decision maker falls to pt's dtr.  Dtr in agreement with this and in agreement with pt being under "XXX" status.  Dtr stated that pt also has another dtr.  [name] and sister in agreement that decisions will be made as a family.  CSW to continue to follow and assist as needed.   Dala Dock, MSW, [place]

## 2012-03-22 NOTE — Progress Notes (Signed)
Patient ID: Diane Palau [name], female   DOB: 25-Apr-1961, 51 y.o.   MRN: [id number] BP 139/67  Pulse 64  Temp(Src) 97.6 F (36.4 C) (Axillary)  Resp 15  Ht 5\' 1"  (1.549 m)  Wt 75.5 kg (166 lb 7.2 oz)  BMI 31.45 kg/m2  SpO2 100% Alert following some commands. Knows place, person, not situation.  Perrl, full eom Symmetric facies Tongue midline Moving all extremities ventric contiunues to drain. Will leave at 5cm until Wednesday and check another scan.

## 2012-03-22 NOTE — Progress Notes (Signed)
Name: [name] MRN: [id number] DOB: [birth year]    LOS: 65  PULMONARY / CRITICAL CARE MEDICINE  Brief patient description:  51 yo with hx of HTN and previous ICH who presented to [facility] ED on 5/15 with severe hypertension and acute SAH.  Ventriculostomy placed.  Events Since Admission: 5/15  Presented to [facility] ED hypertensive with nausea, vomiting, headache and was found to have [place]. Intubated.  Ventriculostomy placed. 5/16  Extubated  5/17  Some hypoxia, off nicardipine drip now 5/18  Variable O2 needs, pcxr edema, tachy, trop neg 5/19  Pulled out ventriculostomy catheter, episodes of tachycardia 5/20  Briefly required Cardene, ventricular drain replaced 5/28  Back on nicardipine 6/1 ventric lowered  Interval history:   No distress. Given morphine for headache Clonidine held overnight for nml bp  Vital Signs: Temp:  [97.7 F (36.5 C)-99 F (37.2 C)] 98.5 F (36.9 C) (06/03 0400) Pulse Rate:  [61-97] 69  (06/03 0700) Resp:  [11-27] 20  (06/03 0700) BP: (106-223)/(58-97) 168/84 mmHg (06/03 0700) SpO2:  [95 %-100 %] 100 % (06/03 0700) Weight:  [75.5 kg (166 lb 7.2 oz)] 75.5 kg (166 lb 7.2 oz) (06/03 0400)  Physical Examination: General:  Comfortable, in no distress Neuro:  Nonfocal, awake, alert HEENT:  Ventriculostomy in place, draining well, amber CSF Neck: No JVD, no line neck Cardiovascular:  RRR, no murmurs Lungs:  Clear anteriorly Abdomen:  Soft, nontender, nondistended, bowel sounds present Musculoskeletal:  Moves all extremities, no pedal edema Skin:  No rash  Principal Problem:  *SAH (subarachnoid hemorrhage) Active Problems:  CAD, NATIVE VESSEL  DIASTOLIC HEART FAILURE, CHRONIC  CHRONIC KIDNEY DISEASE UNSPECIFIED  Acute respiratory failure  Acidosis  Severe hypertension  ASSESSMENT AND PLAN  PULMONARY   ETT:  5/15 >>>5/16.  CXR:  No new film A:  Acute respiratory failure - resolved.  Recurrent  pulmonary edema on the background of diastolic CHF  and IVF.  P:   - Cont Supplemental oxygen, goal SpO2 > 92% - Incentive spirometry. - D/C IVFs to avoid recurrent plum edema   CARDIOVASCULAR  TTE:  5/18 >>> Grade 2 diastolic disease, IVC dilated , EF 55%  A: Hypertensive emergency - resolved.   Recurrent severe hypertensive.   History of CAD, CHF (diastolic).  SVT / sinus tachycardia. P:  - Cont clonidine 0.3 mg PO q8h -Cont Labetalol to 300 mg PO q8h, with holding parameters for SBP of 150 or HR of 60. - Cont hydralazine 50 mg PO q8h, increase as bradycardia times - PRN IV hydralazine. - Goal SBP < 180   RENAL  Lab 03/17/12 0330  NA 139  K 3.6  CL 105  CO2 20  BUN 25*  CREATININE 1.21*  CALCIUM 9.3  MG 2.0  PHOS 4.2   Intake/Output      06/02 0701 - 06/03 0700 06/03 0701 - 06/04 0700   P.O. 760    I.V. (mL/kg) 480 (6.4)    IV Piggyback 417    Total Intake(mL/kg) 1657 (21.9)    Urine (mL/kg/hr) 725 (0.4)    Drains 307    Total Output 1032    Net +625         Urine Occurrence 3 x     Foley:  5/25 >>>  A:  Acute on chronic renal failure, improving.   NAG acidosis - improved P:   - Recheck BMET    GASTROINTESTINAL A:  No active issues P:   - Cont diet - SLP  seen  HEMATOLOGIC A:  No active issues P:  - DVTp: SCDs    INFECTIOUS  Lab 03/17/12 0330  WBC 12.2*  PROCALCITON --    Cultures: NA Antibiotics: Ancef 5/15  (per Neurosurgery) >>> 5/27 Vanc (per NS) 5/27 >>  Moxifloxacin (per NS) 5/27 >>   A:  No definite evidence of infection.  Fever improved 5/31 P:   - Monitor fever curve - ABx per neurosurgery - can simplify in my opinion   ENDOCRINE A:  Hyperglycemia, resolved P:   -  No intervention required   NEUROLOGIC Head CT:   5/16 >>> Diffuse SAH with ventriculomegaly 5/20 >>> Normal size ventricles 5/21 >>> Residual intraventricular hemorrhage, redevelopment of hydrocephalus 5/26 >> Moderate ventricular enlargement despite apparent satisfactory placement of  intraventricular drainage catheter consistent with post subarachnoid hemorrhage hydrocephalus 6/1 >>Stable ventriculomegaly. Right CVP placement appears stable. Decreased small volume of intraventricular hemorrhage. No new intracranial hemorrhage. Continued diffuse cerebral edema. Stable basilar cisterns  Angio: 5/16 >>> Aneurysm coiled, 3 remain TCD:  5/20 >>> Mild vasospasm, elevated ICP   5/27>>>Normal mean flow velocities in majority of identified vessels without evidence of vasospasm.   A:  SAH, nontraumatic, s/p ventriculostomy (replaced 5/21).  Hydrocephalus. Multiple aneurisms, 1 of 4 coiled. P:   - ventric per neurosx    PCCM  To follow intermittently  Doni Widmer V. [id number]

## 2012-03-22 NOTE — Progress Notes (Signed)
Clinical Education officer, museum received report from RN that pt's sisters were upset that "a man" was receiving information on pt.  Per RN report, this person has been to the hospital and represented himself as pt's spouse.  CSW phoned [phone] and asked if he was pt's spouse, Rawdy initially stated 'yes', but when asked "When did you marry"?, Rawdy stated, "Well, we are more like common law, we were never officially married".  CSW explained that in the state of [place], the next of kin first is a legally married spouse.  CSW inquired if pt had a child, and pt stated, "Yes".  When asked for child's name, Rawdy stated "Her first name is [name], but I don't know her last name".  Rawdy does not know [name]'s phone number.  CSW requested that Rawdy get in touch with dtr and phone this CSW back.  Rawdy did state that pt does have a sister named "Annie".  CSW to continue to follow and assist as needed.   Dala Dock, MSW, [place]

## 2012-03-22 NOTE — Progress Notes (Signed)
Pt had c/o of urinary urgency but was unable to void; bladder scan performed and showed >448 cc; MD notified and order given to place foley catheter; 14 french foley catheter placed by Darla, RN without difficulty; clear, yellow urine noted; pt tolerated well; will monitor.

## 2012-03-22 NOTE — Progress Notes (Signed)
ANTIBIOTIC CONSULT NOTE - FOLLOW UP  Pharmacy Consult for Vancomycin Indication: Empiric  Allergies  Allergen Reactions  . Ampicillin Other (See Comments)    unknown  . Penicillins Other (See Comments)    unknown    Patient Measurements: Height: 5\' 1"  (154.9 cm) Weight: 166 lb 7.2 oz (75.5 kg) IBW/kg (Calculated) : 47.8  Vital Signs: Temp: 98.5 F (36.9 C) (06/03 0400) Temp src: Oral (06/03 0400) BP: 161/76 mmHg (06/03 1200) Pulse Rate: 71  (06/03 1200) Intake/Output from previous day: 06/02 0701 - 06/03 0700 In: [id number] [P.O.:760; I.V.:480; IV Piggyback:417] Out: 1032 [Urine:725; Drains:307] Intake/Output from this shift: Total I/O In: 600 [I.V.:100; IV Piggyback:500] Out: 658 [Urine:600; Drains:58]  Labs: No results found for this basename: WBC:3,HGB:3,PLT:3,LABCREA:3,CREATININE:3, in the last 72 hours Estimated Creatinine Clearance: 51.7 ml/min (by C-G formula based on Cr of 1.21). No results found for this basename: VANCOTROUGH:2,VANCOPEAK:2,VANCORANDOM:2,GENTTROUGH:2,GENTPEAK:2,GENTRANDOM:2,TOBRATROUGH:2,TOBRAPEAK:2,TOBRARND:2,AMIKACINPEAK:2,AMIKACINTROU:2,AMIKACIN:2, in the last 72 hours   Microbiology: Recent Results (from the past 720 hour(s))  MRSA PCR SCREENING     Status: Normal   Collection Time   03/03/12 10:20 PM      Component Value Range Status Comment   MRSA by PCR NEGATIVE  NEGATIVE  Final   CULTURE, BLOOD (ROUTINE X 2)     Status: Normal   Collection Time   03/08/12  4:43 PM      Component Value Range Status Comment   Specimen Description BLOOD HAND RIGHT   Final    Special Requests BOTTLES DRAWN AEROBIC AND ANAEROBIC 10CC   Final    Culture  Setup Time [id number]   Final    Culture NO GROWTH 5 DAYS   Final    Report Status 03/15/2012 FINAL   Final   CULTURE, BLOOD (ROUTINE X 2)     Status: Normal   Collection Time   03/08/12  4:47 PM      Component Value Range Status Comment   Specimen Description BLOOD ARM RIGHT   Final    Special Requests  BOTTLES DRAWN AEROBIC AND ANAEROBIC 10CC   Final    Culture  Setup Time [id number]   Final    Culture NO GROWTH 5 DAYS   Final    Report Status 03/15/2012 FINAL   Final   CSF CULTURE     Status: Normal   Collection Time   03/13/12 12:10 PM      Component Value Range Status Comment   Specimen Description CSF   Final    Special Requests NONE   Final    Gram Stain     Final    Value: CYTOSPIN SLIDE WBC PRESENT, PREDOMINANTLY PMN     NO ORGANISMS SEEN     Performed at [facility]   Culture NO GROWTH 3 DAYS   Final    Report Status 03/16/2012 FINAL   Final   GRAM STAIN     Status: Normal   Collection Time   03/13/12 12:10 PM      Component Value Range Status Comment   Specimen Description CSF   Final    Special Requests NONE   Final    Gram Stain     Final    Value: WBC PRESENT, PREDOMINANTLY PMN     NO ORGANISMS SEEN     CYTOSPIN SLIDE   Report Status 03/13/2012 FINAL   Final     Anti-infectives     Start     Dose/Rate Route Frequency Ordered Stop   03/20/12 0900   vancomycin (  VANCOCIN) 1,250 mg in sodium chloride 0.9 % 250 mL IVPB        1,250 mg 166.7 mL/hr over 90 Minutes Intravenous Every 24 hours 03/19/12 1232     03/15/12 1200   moxifloxacin (AVELOX) IVPB 400 mg        400 mg 250 mL/hr over 60 Minutes Intravenous Every 24 hours 03/15/12 0833     03/15/12 1000   vancomycin (VANCOCIN) IVPB 1000 mg/200 mL premix  Status:  Discontinued        1,000 mg 200 mL/hr over 60 Minutes Intravenous Every 12 hours 03/15/12 0831 03/15/12 0856   03/15/12 1000   vancomycin (VANCOCIN) IVPB 1000 mg/200 mL premix  Status:  Discontinued        1,000 mg 200 mL/hr over 60 Minutes Intravenous Every 24 hours 03/15/12 0856 03/19/12 1232   03/15/12 0845   cefTRIAXone (ROCEPHIN) 2 g in dextrose 5 % 50 mL IVPB  Status:  Discontinued        2 g 100 mL/hr over 30 Minutes Intravenous Every 24 hours 03/15/12 0831 03/15/12 0832   03/05/12 1800   ceFAZolin (ANCEF) IVPB 1 g/50 mL premix   Status:  Discontinued        1 g 100 mL/hr over 30 Minutes Intravenous Every 12 hours 03/05/12 1129 03/15/12 0831   03/03/12 2200   ceFAZolin (ANCEF) IVPB 1 g/50 mL premix  Status:  Discontinued        1 g 100 mL/hr over 30 Minutes Intravenous 3 times per day 03/03/12 2135 03/05/12 1129          Assessment: 19 yof on empiric vancomycin + moxifloxacin. All cultures are negative and repeated CCM notes state no definitive infection. Patient remains afebrile with normalized wbc. Ventriculostomy drain still in place. Today is day #8 of vanc + moxi therapy. Last trough (5/31) at 14.1- vancomycin increased for estimated trough ~18  Goal of Therapy:  Vancomycin trough level 15-20 mcg/ml  Plan:  1. Continue Vancomycin 1250mg  IV q24h. 2. Follow-up ability to narrow therapy. 3. If continue vancomycin, will re-check trough later this week to confirm not accumulating.   Sloan Leiter, PharmD, BCPS Clinical Pharmacist [phone] 03/22/2012,12:47 PM

## 2012-03-23 LAB — CBC
HCT: 34.5 % — ABNORMAL LOW (ref 36.0–46.0)
MCH: 28.8 pg (ref 26.0–34.0)
MCV: 89.6 fL (ref 78.0–100.0)
RDW: 15.2 % (ref 11.5–15.5)
WBC: 8.9 10*3/uL (ref 4.0–10.5)

## 2012-03-23 LAB — BASIC METABOLIC PANEL
BUN: 33 mg/dL — ABNORMAL HIGH (ref 6–23)
CO2: 21 mEq/L (ref 19–32)
Calcium: 9.7 mg/dL (ref 8.4–10.5)
Chloride: 110 mEq/L (ref 96–112)
Creatinine, Ser: 1.63 mg/dL — ABNORMAL HIGH (ref 0.50–1.10)
Glucose, Bld: 114 mg/dL — ABNORMAL HIGH (ref 70–99)

## 2012-03-23 NOTE — Progress Notes (Signed)
UR complete 

## 2012-03-23 NOTE — Progress Notes (Signed)
Name: [name] MRN: [id number] DOB: Jun 18, 1961    LOS: 28  PULMONARY / CRITICAL CARE MEDICINE  Brief patient description:  51 yo with hx of HTN and previous ICH who presented to [facility] ED on 5/15 with severe hypertension and acute SAH.  Ventriculostomy placed.  Events Since Admission: 5/15  Presented to [facility] ED hypertensive with nausea, vomiting, headache and was found to have [place]. Intubated.  Ventriculostomy placed. 5/16  Extubated  5/17  Some hypoxia, off nicardipine drip now 5/18  Variable O2 needs, pcxr edema, tachy, trop neg 5/19  Pulled out ventriculostomy catheter, episodes of tachycardia 5/20  Briefly required Cardene, ventricular drain replaced 5/28  Back on nicardipine 6/1 ventric lowered  Interval history:   No distress. Partially oriented. No new issues  Vital Signs: Temp:  [97.6 F (36.4 C)-98.2 F (36.8 C)] 97.8 F (36.6 C) (06/04 1940) Pulse Rate:  [58-67] 60  (06/04 1900) Resp:  [12-24] 21  (06/04 1900) BP: (100-160)/(54-77) 145/70 mmHg (06/04 1900) SpO2:  [99 %-100 %] 100 % (06/04 1900)  Physical Examination: General:  Comfortable, in no distress Neuro:  Nonfocal, awake, alert HEENT:  Ventriculostomy in place, draining well, amber CSF Neck: No JVD, no line neck Cardiovascular:  RRR, no murmurs Lungs:  Clear anteriorly Abdomen:  Soft, nontender, nondistended, bowel sounds present Musculoskeletal:  Moves all extremities, no pedal edema Skin:  No rash  Principal Problem:  *SAH (subarachnoid hemorrhage) Active Problems:  Severe hypertension  DIASTOLIC HEART FAILURE, CHRONIC  Acidosis  CAD, NATIVE VESSEL  CHRONIC KIDNEY DISEASE UNSPECIFIED  Acute respiratory failure  ASSESSMENT AND PLAN  PULMONARY   ETT:  5/15 >>>5/16.  CXR:  No new film A:  Acute respiratory failure - resolved.  Recurrent  pulmonary edema on the background of diastolic CHF and IVF.  P:   - Cont Supplemental oxygen, goal SpO2 > 92%   CARDIOVASCULAR  TTE:  5/18 >>> Grade  2 diastolic disease, IVC dilated , EF 55%  A: Hypertensive emergency - resolved.   Recurrent severe hypertensive.   History of CAD, CHF (diastolic).  SVT / sinus tachycardia. P:  - Cont clonidine 0.3 mg PO q8h -Cont Labetalol to 300 mg PO q8h, with holding parameters for SBP of 150 or HR of 60. - Cont hydralazine 50 mg PO q8h, increase as bradycardia times - PRN IV hydralazine. - Goal SBP < 180     INFECTIOUS  Cultures: NA Antibiotics: Ancef 5/15  (per Neurosurgery) >>> 5/27 Vanc (per NS) 5/27 >>  6/4 Moxifloxacin (per NS) 5/27 >> 6/4  Discussed with Dr Loyola Mast. D/C abx    NEUROLOGIC Head CT:   5/16 >>> Diffuse SAH with ventriculomegaly 5/20 >>> Normal size ventricles 5/21 >>> Residual intraventricular hemorrhage, redevelopment of hydrocephalus 5/26 >> Moderate ventricular enlargement despite apparent satisfactory placement of intraventricular drainage catheter consistent with post subarachnoid hemorrhage hydrocephalus 6/1 >>Stable ventriculomegaly. Right CVP placement appears stable. Decreased small volume of intraventricular hemorrhage. No new intracranial hemorrhage. Continued diffuse cerebral edema. Stable basilar cisterns  Angio: 5/16 >>> Aneurysm coiled, 3 remain TCD:  5/20 >>> Mild vasospasm, elevated ICP   5/27>>>Normal mean flow velocities in majority of identified vessels without evidence of vasospasm.   A:  SAH, nontraumatic, s/p ventriculostomy (replaced 5/21).  Hydrocephalus. Multiple aneurisms, 1 of 4 coiled. P:   - ventric per neurosx    PCCM  Will sign off. Please call if we can be of further assistance     Merton Border, MD;  PCCM service; [place] (  [phone]  

## 2012-03-23 NOTE — Progress Notes (Signed)
Patient ID: [name] xxx Nass, female   DOB: [birth year], 51 y.o.   MRN: [id number] Alert oriented to person, place Joking and smiling today,  Perrl, full eom ventric draining well Will check ct tomorrow Doing much better

## 2012-03-23 NOTE — Progress Notes (Signed)
Speech Language Pathology Dysphagia Treatment Patient Details Name: marcie vandrunen MRN: [id number] DOB: [birth year] Today's Date: 03/23/2012 Time: [id number] SLP Time Calculation (min): 14 min  Assessment / Plan / Recommendation Clinical Impression  Pt alert, answering questions, poor initiation; inert.  No real changes in eating/swallowing.  Remains with poor PO intake per dietary notes; occasional oral holding per sitter, but with functional mastication and adequate airway protection per observation today.  SLP will sign-off at this time given functional overall swallow.       Diet Recommendation  Continue with Current Diet: Dysphagia 3 (mechanical soft);Thin liquid    SLP Plan All goals met   Pertinent Vitals/Pain Appears comfortable   Swallowing Goals  SLP Swallowing Goals Patient will utilize recommended strategies during swallow to increase swallowing safety with: Supervision/safety  General Temperature Spikes Noted: No Respiratory Status: Room air Behavior/Cognition:  (poor initiation) Oral Cavity - Dentition: Adequate natural dentition Patient Positioning: Upright in bed   Dysphagia Treatment Treatment focused on: Skilled observation of diet tolerance Treatment Methods/Modalities: Skilled observation Patient observed directly with PO's: Yes Type of PO's observed: Dysphagia 1 (puree);Thin liquids Feeding: Needs assist Liquids provided via: Straw Amount of cueing:  (none)   Assunta Curtis 03/23/2012, 10:50 AM

## 2012-03-23 NOTE — Clinical Documentation Improvement (Signed)
DOCUMENTATION CLARIFICATION QUERY  THIS DOCUMENT IS NOT A PERMANENT PART OF THE MEDICAL RECORD  Please update your documentation within the medical record to reflect your response to this query.                                                                                        03/23/12   Dear [facility],  In a better effort to capture your patient's severity of illness, reflect appropriate length of stay and utilization of resources, a review of the patient medical record has revealed the following indicators.   Based on your clinical judgment, please clarify and document in a progress note and/or discharge summary the clinical condition associated with the following supporting information: In responding to this query please exercise your independent judgment.  The fact that a query is asked, does not imply that any particular answer is desired or expected.   Please consider the below (if your clinical findings/judgment agree) as you document the patient's diagnosis/condition(s) in the progress note and discharge summary. Thank you!  - CT HEAD 03/20/12 IMPRESSION: "Continued diffuse cerebral edema. Stable basilar cisterns"  - Patient continues to be monitored in ICU with ventriculostomy.  - "Alert following some commands. Knows place, person, not situation." progress note 03/22/12  Possible Clinical Conditions?  - Cerebral Edema-no.  - Diffuse Cerebral Edema-no  - Other condition (please document in the progress notes and/or discharge summary)  - Cannot Clinically determine at this time      Reviewed:  no additional documentation provided  Thank You,  [place] Documentation Specialist: [phone] Pager  [place]

## 2012-03-24 ENCOUNTER — Inpatient Hospital Stay (HOSPITAL_COMMUNITY): Payer: No Typology Code available for payment source

## 2012-03-24 MED ORDER — DIAZEPAM 5 MG PO TABS
5.0000 mg | ORAL_TABLET | Freq: Four times a day (QID) | ORAL | Status: DC | PRN
  Administered 2012-03-25: 10 mg via ORAL
  Filled 2012-03-24: qty 2

## 2012-03-24 MED ORDER — DIAZEPAM 5 MG/ML IJ SOLN
5.0000 mg | Freq: Four times a day (QID) | INTRAMUSCULAR | Status: DC | PRN
  Administered 2012-03-24: 10 mg via INTRAVENOUS
  Administered 2012-03-25: 5 mg via INTRAVENOUS
  Administered 2012-03-25: 10 mg via INTRAVENOUS
  Filled 2012-03-24 (×3): qty 2

## 2012-03-24 MED ORDER — CEFAZOLIN SODIUM 1-5 GM-% IV SOLN
1.0000 g | Freq: Three times a day (TID) | INTRAVENOUS | Status: DC
  Administered 2012-03-24 – 2012-03-27 (×9): 1 g via INTRAVENOUS
  Filled 2012-03-24 (×11): qty 50

## 2012-03-24 NOTE — Progress Notes (Signed)
Patient ID: [name], female   DOB: 29-Sep-1961, 51 y.o.   MRN: [id number] BP 161/78  Pulse 64  Temp(Src) 97.6 F (36.4 C) (Oral)  Resp 17  Ht 5\' 1"  (1.549 m)  Wt 72.8 kg (160 lb 7.9 oz)  BMI 30.33 kg/m2  SpO2 100% Alert, follows commands Moves all extremities ventric draining well. Head CT is unchanged. Will send csf and plan on a shunt later this week.

## 2012-03-25 LAB — CSF CELL COUNT WITH DIFFERENTIAL
RBC Count, CSF: 430 /mm3 — ABNORMAL HIGH
WBC, CSF: 9 /mm3 — ABNORMAL HIGH (ref 0–5)

## 2012-03-25 MED ORDER — LEVETIRACETAM 100 MG/ML PO SOLN
500.0000 mg | Freq: Two times a day (BID) | ORAL | Status: DC
  Filled 2012-03-25 (×2): qty 5

## 2012-03-25 MED ORDER — LABETALOL HCL 5 MG/ML IV SOLN
INTRAVENOUS | Status: AC
  Filled 2012-03-25: qty 4

## 2012-03-25 MED ORDER — LABETALOL HCL 5 MG/ML IV SOLN
10.0000 mg | INTRAVENOUS | Status: DC | PRN
Start: 2012-03-25 — End: 2012-04-05
  Administered 2012-03-25 – 2012-04-01 (×4): 10 mg via INTRAVENOUS
  Filled 2012-03-25 (×3): qty 4

## 2012-03-25 MED ORDER — LABETALOL HCL 5 MG/ML IV SOLN
10.0000 mg | Freq: Once | INTRAVENOUS | Status: AC
  Administered 2012-03-25: 10 mg via INTRAVENOUS

## 2012-03-25 MED ORDER — SODIUM CHLORIDE 0.9 % IV SOLN
500.0000 mg | Freq: Two times a day (BID) | INTRAVENOUS | Status: DC
  Administered 2012-03-25 – 2012-04-05 (×22): 500 mg via INTRAVENOUS
  Filled 2012-03-25 (×24): qty 5

## 2012-03-25 NOTE — Progress Notes (Signed)
Patient ID: [name], female   DOB: 1961/09/24, 50 y.o.   MRN: [id number] BP 176/74  Pulse 73  Temp(Src) 98.1 F (36.7 C) (Oral)  Resp 21  Ht 5\' 1"  (1.549 m)  Wt 72.8 kg (160 lb 7.9 oz)  BMI 30.33 kg/m2  SpO2 100% Alert, does respond but slowly. Oriented to person.  Neck is stiff, her position is quite awkward in bed. She will not let you reposition her neck.  Will send csf today. She needs permanent diversion.

## 2012-03-25 NOTE — Progress Notes (Signed)
Nutrition Follow-up  Diet Order:  Dysphagia 3/Thin Spoke with RN, pt continues to not eat despite offering her favorite foods. When pt is willing to take some bites of food she pockets them. Pt has also refused ensure.  Pt does seem more alert/talkative but is not eating. Pt has been without adequate nutrition > 20 days.   Meds: Scheduled Meds:   . antiseptic oral rinse  15 mL Mouth Rinse BID  .  ceFAZolin (ANCEF) IV  1 g Intravenous Q8H  . cloNIDine  0.3 mg Oral Q8H  . feeding supplement  237 mL Oral QID  . hydrALAZINE  75 mg Oral Q8H  . labetalol  300 mg Oral TID  . levETIRAcetam  500 mg Oral BID  . niMODipine  60 mg Oral Q4H  . pantoprazole  40 mg Oral QHS  . senna-docusate  1 tablet Oral BID   Continuous Infusions:   . sodium chloride 20 mL/hr at 03/23/12 0800  . niCARDipine Stopped (03/17/12 1403)   PRN Meds:.acetaminophen, acetaminophen, acetaminophen, diazepam, diazepam, hydrALAZINE, HYDROcodone-acetaminophen, morphine injection, ondansetron (ZOFRAN) IV  Labs:  CMP     Component Value Date/Time   NA 143 03/23/2012 0400   K 4.9 03/23/2012 0400   CL 110 03/23/2012 0400   CO2 21 03/23/2012 0400   GLUCOSE 114* 03/23/2012 0400   BUN 33* 03/23/2012 0400   CREATININE 1.63* 03/23/2012 0400   CALCIUM 9.7 03/23/2012 0400   PROT 7.0 03/08/2012 0425   ALBUMIN 2.8* 03/08/2012 0425   AST 10 03/08/2012 0425   ALT <5 03/08/2012 0425   ALKPHOS 74 03/08/2012 0425   BILITOT 0.3 03/08/2012 0425   GFRNONAA 36* 03/23/2012 0400   GFRAA 41* 03/23/2012 0400  CBG (last 3)  No results found for this basename: GLUCAP:3 in the last 72 hours   Intake/Output Summary (Last 24 hours) at 03/25/12 0950 Last data filed at 03/25/12 0900  Gross per 24 hour  Intake    680 ml  Output   1343 ml  Net   -663 ml    Weight Status:   160 lbs 6/5  Estimated needs: Kcal: 1700-1900; Protein: 80-90 grams; Fluid: >1.7 L/day   Nutrition Dx: Inadequate oral intake now r/t lethargy AEB intake of < 50% of her meals; ongoing    Goal: Provide >/= 90% of her estimated needs; not met.   Intervention:  Continue Magic cup with meals  Continue Ensure Complete to QID, chocolate  Assist pt with menu selections to help improve intake Consider adding appetite stimulant to help increase intake versus short term enteral nutrition support, Would start Jevity 1.2 @ 20 ml/hr and increase by 10 ml every 8 hours to goal rate of 60 ml/hr (1728 kcal, 80 grams protein, 1166 ml H2O), RN to discuss with MD during evening rounds, also left MD sticky note.  Monitor: Po intake  [name], [place] Pager #:  [phone]

## 2012-03-25 NOTE — Progress Notes (Signed)
Clinical Social Worker staffed case with [employer].  CSW notes that pt remains confused, family continues to be available at bedside (per RN report).  CSW to continue to follow and assist as needed.   Dala Dock, MSW, [place]

## 2012-03-25 NOTE — Plan of Care (Signed)
Problem: Inadequate Intake (NI-2.1) Goal: Food and/or nutrient delivery Individualized approach for food/nutrient provision.  Outcome: Not Progressing Pt continues to refuse meals/supplements.

## 2012-03-26 NOTE — Progress Notes (Signed)
Patient ID: [name], female   DOB: 1961-09-16, 51 y.o.   MRN: [id number] BP 143/64  Pulse 84  Temp(Src) 98.9 F (37.2 C) (Oral)  Resp 17  Ht 5\' 1"  (1.549 m)  Wt 72.8 kg (160 lb 7.9 oz)  BMI 30.33 kg/m2  SpO2 100% Lethargic, will open eyes, and respond to moderate stimuli. Perrl, full eom Moves all extremities Needs shunt, plan to do procedure on Sunday.  Will dc morphine. Not taking po's BP running high, had to restart nicardipine drip.

## 2012-03-27 ENCOUNTER — Encounter (HOSPITAL_COMMUNITY): Payer: Self-pay | Admitting: Anesthesiology

## 2012-03-27 ENCOUNTER — Encounter (HOSPITAL_COMMUNITY)
Admission: EM | Disposition: A | Discharge status: Rehabilitation Facility | Payer: Self-pay | Source: Home / Self Care | Attending: Neurosurgery

## 2012-03-27 ENCOUNTER — Inpatient Hospital Stay (HOSPITAL_COMMUNITY): Payer: No Typology Code available for payment source

## 2012-03-27 ENCOUNTER — Inpatient Hospital Stay (HOSPITAL_COMMUNITY): Payer: No Typology Code available for payment source | Admitting: Anesthesiology

## 2012-03-27 DIAGNOSIS — I609 Nontraumatic subarachnoid hemorrhage, unspecified: Secondary | ICD-10-CM

## 2012-03-27 HISTORY — PX: VENTRICULOPERITONEAL SHUNT: SHX204

## 2012-03-27 LAB — CBC
HCT: 34.7 % — ABNORMAL LOW (ref 36.0–46.0)
MCHC: 32.9 g/dL (ref 30.0–36.0)
MCV: 87 fL (ref 78.0–100.0)
RDW: 14.6 % (ref 11.5–15.5)

## 2012-03-27 LAB — BLOOD GAS, ARTERIAL
Bicarbonate: 18.3 mEq/L — ABNORMAL LOW (ref 20.0–24.0)
TCO2: 19 mmol/L (ref 0–100)
pCO2 arterial: 24.1 mmHg — ABNORMAL LOW (ref 35.0–45.0)
pH, Arterial: 7.489 — ABNORMAL HIGH (ref 7.350–7.400)
pO2, Arterial: 154 mmHg — ABNORMAL HIGH (ref 80.0–100.0)

## 2012-03-27 LAB — BASIC METABOLIC PANEL
BUN: 27 mg/dL — ABNORMAL HIGH (ref 6–23)
CO2: 19 mEq/L (ref 19–32)
Chloride: 109 mEq/L (ref 96–112)
Creatinine, Ser: 1.29 mg/dL — ABNORMAL HIGH (ref 0.50–1.10)

## 2012-03-27 SURGERY — SHUNT INSERTION VENTRICULAR-PERITONEAL
Anesthesia: General

## 2012-03-27 MED ORDER — LIDOCAINE-EPINEPHRINE 0.5-1:200000 % IJ SOLN
INTRAMUSCULAR | Status: DC | PRN
  Administered 2012-03-27: 10 mL via INTRADERMAL

## 2012-03-27 MED ORDER — ONDANSETRON HCL 4 MG PO TABS: 4.0000 mg | ORAL_TABLET | ORAL | Status: DC | PRN

## 2012-03-27 MED ORDER — ONDANSETRON HCL 4 MG/2ML IJ SOLN: 4.0000 mg | INTRAMUSCULAR | Status: DC | PRN

## 2012-03-27 MED ORDER — ONDANSETRON HCL 4 MG/2ML IJ SOLN
INTRAMUSCULAR | Status: DC | PRN
  Administered 2012-03-27: 4 mg via INTRAVENOUS

## 2012-03-27 MED ORDER — MAGNESIUM CITRATE PO SOLN
1.0000 | Freq: Once | ORAL | Status: AC | PRN
  Filled 2012-03-27: qty 296

## 2012-03-27 MED ORDER — PROPOFOL 10 MG/ML IV EMUL
INTRAVENOUS | Status: DC | PRN
  Administered 2012-03-27: 30 mg via INTRAVENOUS
  Administered 2012-03-27: 120 mg via INTRAVENOUS

## 2012-03-27 MED ORDER — CHLORHEXIDINE GLUCONATE 0.12 % MT SOLN
15.0000 mL | Freq: Two times a day (BID) | OROMUCOSAL | Status: DC
  Administered 2012-03-27 – 2012-03-31 (×8): 15 mL via OROMUCOSAL
  Filled 2012-03-27 (×8): qty 15

## 2012-03-27 MED ORDER — CEFAZOLIN SODIUM 1-5 GM-% IV SOLN
1.0000 g | Freq: Three times a day (TID) | INTRAVENOUS | Status: AC
  Administered 2012-03-27 – 2012-03-28 (×2): 1 g via INTRAVENOUS
  Filled 2012-03-27 (×2): qty 50

## 2012-03-27 MED ORDER — ROCURONIUM BROMIDE 100 MG/10ML IV SOLN
INTRAVENOUS | Status: DC | PRN
  Administered 2012-03-27: 50 mg via INTRAVENOUS

## 2012-03-27 MED ORDER — PROMETHAZINE HCL 25 MG PO TABS: 12.5000 mg | ORAL_TABLET | ORAL | Status: DC | PRN

## 2012-03-27 MED ORDER — BIOTENE DRY MOUTH MT LIQD
15.0000 mL | Freq: Four times a day (QID) | OROMUCOSAL | Status: DC
  Administered 2012-03-28 – 2012-04-05 (×34): 15 mL via OROMUCOSAL

## 2012-03-27 MED ORDER — ACETAMINOPHEN-CODEINE 120-12 MG/5ML PO SOLN
10.0000 mL | ORAL | Status: DC | PRN
Start: 2012-03-27 — End: 2012-04-05

## 2012-03-27 MED ORDER — 0.9 % SODIUM CHLORIDE (POUR BTL) OPTIME
TOPICAL | Status: DC | PRN
  Administered 2012-03-27: 1000 mL

## 2012-03-27 MED ORDER — NEOSTIGMINE METHYLSULFATE 1 MG/ML IJ SOLN
INTRAMUSCULAR | Status: DC | PRN
  Administered 2012-03-27: 5 mg via INTRAVENOUS

## 2012-03-27 MED ORDER — MORPHINE SULFATE 2 MG/ML IJ SOLN: 1.0000 mg | INTRAMUSCULAR | Status: DC | PRN

## 2012-03-27 MED ORDER — POTASSIUM CHLORIDE IN NACL 20-0.9 MEQ/L-% IV SOLN
INTRAVENOUS | Status: DC
  Administered 2012-03-27: 21:00:00 via INTRAVENOUS
  Administered 2012-03-28: 20 mL via INTRAVENOUS
  Administered 2012-03-30 – 2012-04-01 (×2): via INTRAVENOUS
  Filled 2012-03-27 (×8): qty 1000

## 2012-03-27 MED ORDER — HYDROCODONE-ACETAMINOPHEN 5-325 MG PO TABS: 1.0000 | ORAL_TABLET | ORAL | Status: DC | PRN

## 2012-03-27 MED ORDER — PROPOFOL 10 MG/ML IV EMUL
INTRAVENOUS | Status: AC
  Administered 2012-03-27: 30 ug/kg/min via INTRAVENOUS
  Filled 2012-03-27: qty 100

## 2012-03-27 MED ORDER — SUCCINYLCHOLINE CHLORIDE 20 MG/ML IJ SOLN
INTRAMUSCULAR | Status: DC | PRN
  Administered 2012-03-27: 100 mg via INTRAVENOUS

## 2012-03-27 MED ORDER — LACTATED RINGERS IV SOLN
INTRAVENOUS | Status: DC | PRN
  Administered 2012-03-27: 17:00:00 via INTRAVENOUS

## 2012-03-27 MED ORDER — SODIUM CHLORIDE 0.9 % IV SOLN: 500.0000 mg | Freq: Two times a day (BID) | INTRAVENOUS | Status: DC

## 2012-03-27 MED ORDER — PHENYLEPHRINE HCL 10 MG/ML IJ SOLN
INTRAMUSCULAR | Status: DC | PRN
  Administered 2012-03-27: 80 ug via INTRAVENOUS

## 2012-03-27 MED ORDER — VECURONIUM BROMIDE 10 MG IV SOLR
INTRAVENOUS | Status: DC | PRN
  Administered 2012-03-27 (×2): 2 mg via INTRAVENOUS

## 2012-03-27 MED ORDER — GLYCOPYRROLATE 0.2 MG/ML IJ SOLN
INTRAMUSCULAR | Status: DC | PRN
  Administered 2012-03-27: 0.6 mg via INTRAVENOUS

## 2012-03-27 MED ORDER — PANTOPRAZOLE SODIUM 40 MG IV SOLR
40.0000 mg | Freq: Every day | INTRAVENOUS | Status: DC
  Administered 2012-03-27 – 2012-03-28 (×2): 40 mg via INTRAVENOUS
  Filled 2012-03-27 (×4): qty 40

## 2012-03-27 MED ORDER — DIAZEPAM 5 MG PO TABS
5.0000 mg | ORAL_TABLET | Freq: Four times a day (QID) | ORAL | Status: DC | PRN
  Administered 2012-03-31: 5 mg via ORAL
  Filled 2012-03-27: qty 1

## 2012-03-27 MED ORDER — LABETALOL HCL 5 MG/ML IV SOLN
10.0000 mg | INTRAVENOUS | Status: DC | PRN
  Administered 2012-03-30 (×2): 10 mg via INTRAVENOUS
  Administered 2012-03-31 – 2012-04-02 (×2): 20 mg via INTRAVENOUS
  Filled 2012-03-27 (×4): qty 4
  Filled 2012-03-27: qty 8

## 2012-03-27 MED ORDER — THROMBIN 20000 UNITS EX KIT
PACK | CUTANEOUS | Status: DC | PRN
  Administered 2012-03-27: 17:00:00 via TOPICAL

## 2012-03-27 MED ORDER — FENTANYL CITRATE 0.05 MG/ML IJ SOLN
25.0000 ug | INTRAMUSCULAR | Status: DC | PRN
  Administered 2012-03-27 – 2012-03-28 (×3): 100 ug via INTRAVENOUS
  Administered 2012-03-28: 50 ug via INTRAVENOUS
  Administered 2012-03-28 (×2): 100 ug via INTRAVENOUS
  Administered 2012-03-29: 50 ug via INTRAVENOUS
  Administered 2012-03-29: 100 ug via INTRAVENOUS
  Filled 2012-03-27 (×8): qty 2

## 2012-03-27 MED ORDER — BISACODYL 5 MG PO TBEC
5.0000 mg | DELAYED_RELEASE_TABLET | Freq: Every day | ORAL | Status: DC | PRN
  Administered 2012-03-27: 5 mg via ORAL
  Filled 2012-03-27: qty 1

## 2012-03-27 MED ORDER — LIDOCAINE HCL (CARDIAC) 20 MG/ML IV SOLN
INTRAVENOUS | Status: DC | PRN
  Administered 2012-03-27 (×2): 100 mg via INTRAVENOUS

## 2012-03-27 MED ORDER — PROPOFOL 10 MG/ML IV EMUL
5.0000 ug/kg/min | INTRAVENOUS | Status: DC
  Administered 2012-03-27: 30 ug/kg/min via INTRAVENOUS
  Administered 2012-03-28 – 2012-03-29 (×3): 20 ug/kg/min via INTRAVENOUS
  Filled 2012-03-27 (×3): qty 100

## 2012-03-27 MED ORDER — FENTANYL CITRATE 0.05 MG/ML IJ SOLN
INTRAMUSCULAR | Status: DC | PRN
  Administered 2012-03-27 (×2): 100 ug via INTRAVENOUS
  Administered 2012-03-27 (×2): 50 ug via INTRAVENOUS

## 2012-03-27 MED ORDER — ACETAMINOPHEN-CODEINE #3 300-30 MG PO TABS
1.0000 | ORAL_TABLET | ORAL | Status: DC | PRN
  Administered 2012-03-30: 2 via ORAL
  Filled 2012-03-27: qty 2

## 2012-03-27 SURGICAL SUPPLY — 83 items
BLADE SURG 10 STRL SS (BLADE) IMPLANT
BLADE SURG 11 STRL SS (BLADE) ×2 IMPLANT
BLADE SURG 15 STRL LF DISP TIS (BLADE) IMPLANT
BLADE SURG 15 STRL SS (BLADE)
BLADE SURG ROTATE 9660 (MISCELLANEOUS) ×4 IMPLANT
BOOT SUTURE AID YELLOW STND (SUTURE) ×2 IMPLANT
BRUSH SCRUB EZ 1% IODOPHOR (MISCELLANEOUS) IMPLANT
BUR ACORN 6.0 PRECISION (BURR) ×2 IMPLANT
CANISTER SUCTION 2500CC (MISCELLANEOUS) ×2 IMPLANT
CLIP RANEY DISP (INSTRUMENTS) IMPLANT
CLOTH BEACON ORANGE TIMEOUT ST (SAFETY) ×2 IMPLANT
CONT SPEC 4OZ CLIKSEAL STRL BL (MISCELLANEOUS) ×2 IMPLANT
CORDS BIPOLAR (ELECTRODE) ×2 IMPLANT
COVER MAYO STAND STRL (DRAPES) IMPLANT
DECANTER SPIKE VIAL GLASS SM (MISCELLANEOUS) ×2 IMPLANT
DERMABOND ADVANCED (GAUZE/BANDAGES/DRESSINGS) ×1
DERMABOND ADVANCED .7 DNX12 (GAUZE/BANDAGES/DRESSINGS) ×1 IMPLANT
DRAPE INCISE IOBAN 66X45 STRL (DRAPES) ×2 IMPLANT
DRAPE ORTHO SPLIT 77X108 STRL (DRAPES) ×2
DRAPE POUCH INSTRU U-SHP 10X18 (DRAPES) ×2 IMPLANT
DRAPE PROXIMA HALF (DRAPES) IMPLANT
DRAPE SURG ORHT 6 SPLT 77X108 (DRAPES) ×2 IMPLANT
DRESSING TELFA 8X3 (GAUZE/BANDAGES/DRESSINGS) IMPLANT
DRSG OPSITE 4X5.5 SM (GAUZE/BANDAGES/DRESSINGS) ×2 IMPLANT
DURAPREP 26ML APPLICATOR (WOUND CARE) ×4 IMPLANT
ELECT CAUTERY BLADE 6.4 (BLADE) ×2 IMPLANT
ELECT REM PT RETURN 9FT ADLT (ELECTROSURGICAL) ×2
ELECTRODE REM PT RTRN 9FT ADLT (ELECTROSURGICAL) ×1 IMPLANT
GAUZE SPONGE 4X4 16PLY XRAY LF (GAUZE/BANDAGES/DRESSINGS) IMPLANT
GLOVE BIO SURGEON STRL SZ 6.5 (GLOVE) ×2 IMPLANT
GLOVE BIO SURGEON STRL SZ7 (GLOVE) ×2 IMPLANT
GLOVE BIO SURGEON STRL SZ7.5 (GLOVE) IMPLANT
GLOVE BIO SURGEON STRL SZ8 (GLOVE) IMPLANT
GLOVE BIO SURGEON STRL SZ8.5 (GLOVE) IMPLANT
GLOVE BIOGEL M 8.0 STRL (GLOVE) IMPLANT
GLOVE ECLIPSE 6.5 STRL STRAW (GLOVE) ×2 IMPLANT
GLOVE ECLIPSE 7.0 STRL STRAW (GLOVE) IMPLANT
GLOVE ECLIPSE 7.5 STRL STRAW (GLOVE) IMPLANT
GLOVE ECLIPSE 8.0 STRL XLNG CF (GLOVE) IMPLANT
GLOVE ECLIPSE 8.5 STRL (GLOVE) IMPLANT
GLOVE EXAM NITRILE LRG STRL (GLOVE) IMPLANT
GLOVE EXAM NITRILE MD LF STRL (GLOVE) IMPLANT
GLOVE EXAM NITRILE XL STR (GLOVE) IMPLANT
GLOVE EXAM NITRILE XS STR PU (GLOVE) IMPLANT
GLOVE INDICATOR 6.5 STRL GRN (GLOVE) IMPLANT
GLOVE INDICATOR 7.0 STRL GRN (GLOVE) IMPLANT
GLOVE INDICATOR 7.5 STRL GRN (GLOVE) IMPLANT
GLOVE INDICATOR 8.0 STRL GRN (GLOVE) IMPLANT
GLOVE INDICATOR 8.5 STRL (GLOVE) IMPLANT
GLOVE OPTIFIT SS 8.0 STRL (GLOVE) IMPLANT
GLOVE SURG SS PI 6.5 STRL IVOR (GLOVE) IMPLANT
GOWN BRE IMP SLV AUR LG STRL (GOWN DISPOSABLE) ×4 IMPLANT
GOWN BRE IMP SLV AUR XL STRL (GOWN DISPOSABLE) IMPLANT
GOWN STRL REIN 2XL LVL4 (GOWN DISPOSABLE) IMPLANT
HEMOSTAT SURGICEL 2X14 (HEMOSTASIS) IMPLANT
KIT BASIN OR (CUSTOM PROCEDURE TRAY) ×2 IMPLANT
KIT ROOM TURNOVER OR (KITS) ×2 IMPLANT
MARKER SKIN DUAL TIP RULER LAB (MISCELLANEOUS) IMPLANT
NEEDLE HYPO 25X1 1.5 SAFETY (NEEDLE) ×2 IMPLANT
NS IRRIG 1000ML POUR BTL (IV SOLUTION) ×2 IMPLANT
PACK EENT II TURBAN DRAPE (CUSTOM PROCEDURE TRAY) IMPLANT
PAD ARMBOARD 7.5X6 YLW CONV (MISCELLANEOUS) ×6 IMPLANT
PATTIES SURGICAL .5 X.5 (GAUZE/BANDAGES/DRESSINGS) IMPLANT
PENCIL BUTTON HOLSTER BLD 10FT (ELECTRODE) IMPLANT
SPONGE LAP 4X18 X RAY DECT (DISPOSABLE) ×2 IMPLANT
SPONGE SURGIFOAM ABS GEL 12-7 (HEMOSTASIS) IMPLANT
STAPLER SKIN PROX WIDE 3.9 (STAPLE) ×2 IMPLANT
STRIP CLOSURE SKIN 1/4X4 (GAUZE/BANDAGES/DRESSINGS) IMPLANT
SUT BONE WAX W31G (SUTURE) IMPLANT
SUT ETHILON 3 0 PS 1 (SUTURE) IMPLANT
SUT NURALON 4 0 TR CR/8 (SUTURE) IMPLANT
SUT SILK 0 TIES 10X30 (SUTURE) ×2 IMPLANT
SUT SILK 3 0 SH 30 (SUTURE) ×2 IMPLANT
SUT VIC AB 2-0 CT2 18 VCP726D (SUTURE) ×2 IMPLANT
SUT VIC AB 3-0 SH 8-18 (SUTURE) ×2 IMPLANT
SYR BULB 3OZ (MISCELLANEOUS) ×2 IMPLANT
SYR CONTROL 10ML LL (SYRINGE) ×2 IMPLANT
TOWEL OR 17X24 6PK STRL BLUE (TOWEL DISPOSABLE) ×2 IMPLANT
TOWEL OR 17X26 10 PK STRL BLUE (TOWEL DISPOSABLE) ×2 IMPLANT
TUBE CONNECTING 12X1/4 (SUCTIONS) ×2 IMPLANT
UNDERPAD 30X30 INCONTINENT (UNDERPADS AND DIAPERS) ×2 IMPLANT
VALVE RT ANGLE UNITIZE DIST (Valve) ×2 IMPLANT
WATER STERILE IRR 1000ML POUR (IV SOLUTION) ×2 IMPLANT

## 2012-03-27 NOTE — Progress Notes (Signed)
[facility] Progress Note Patient Name: [name] DOB: 04-24-1961 MRN: [id number]  Date of Service  03/27/2012   HPI/Events of Note  From OR post shunt, intubated   eICU Interventions  Given cont sedation & vent orders Labs & CXR   Intervention Category Minor Interventions: Agitation / anxiety - evaluation and management;Clinical assessment - ordering diagnostic tests  Ediel Unangst V. 03/27/2012, 7:58 PM

## 2012-03-27 NOTE — Progress Notes (Signed)
Alerted by sitter that patient had pulled her IVC. Dr. Christella Noa informed and instructed to call Dr. Vertell Limber to place a staple at the insertion site. Dr. Vertell Limber contacted and arrived to place staples at insertion site of IVC. Patient tolerated the procedure fairly. Will proceed with getting consent to internalize shunt per Dr. Lacy Duverney instructions. Will also continue to monitor patient.

## 2012-03-27 NOTE — Op Note (Signed)
03/03/2012 - 03/27/2012  7:41 PM  PATIENT:  [name]  51 y.o. female  PRE-OPERATIVE DIAGNOSIS:  Hydrocephalus, obstructive  POST-OPERATIVE DIAGNOSIS:  Hydrocephalus, obstructive  PROCEDURE:  Procedure(s): SHUNT INSERTION VENTRICULAR-PERITONEAL right side Codman programmable valve 166mm  SURGEON:  Surgeon(s): Winfield Cunas, MD Erline Levine, MD  ASSISTANTS:Stern  ANESTHESIA:   general  EBL:     BLOOD ADMINISTERED:none  CELL SAVER GIVEN:none  COUNT:per nursing  DRAINS: none   SPECIMEN:  No Specimen  DICTATION: 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 She developed obstructive hydrocephalus almost immediately. She has not been able to go without the ventricular catheter. At this time I decided to place a permanent ventriculoperitoneal shunt.  She was brought to the OR intubated and placed under a general anesthetic without difficulty. She was positioned supine on the OR table with her head turned towards the left side. I shaved and prepped the Right side of her head, neck, thorax, and abdomen. I infiltrated lidocaine into the cranial incision, post auricular region and abdomen. She was draped in a sterile manner. I opened the abdominal incision first and proceeded through the rectus, posterior rectus sheath, and the peritoneum. I then tunneled the tunneler from the abdominal incision to the postauricular region. I brought the tunneler through my incision, past a more proximal incision that I had made. I passed a silk tie from the postauricular incision to the abdominal incision. I then made my cranial incision and passed a tonsil to the postauricular incision, passing a silk tie there.  I programmed the shunt valve to 185mmhg, I then passed the shunt from the cranial incision to the abdominal incision. Dr. Vertell Limber then passed the ventricular catheter, and achieved good return of spinal fluid. We connected this to the [place] reservoir, and observed  spontaneous flow from the distal end of the shunt tubing. I placed the shunt tubing into the peritoneum without difficulty. Dr. Vertell Limber closed the cranial incisions in layers with vicryl sutures and staples. I closed the abdominal incision with vicryls and dermabond. I placed sterile dressings on the cranial incisions. She was taken directly to the Icu ventilated.  PLAN OF CARE: Admit to inpatient   PATIENT DISPOSITION:  ICU - intubated and hemodynamically stable.   Delay start of Pharmacological VTE agent (>24hrs) due to surgical blood loss or risk of bleeding:  yes

## 2012-03-27 NOTE — Anesthesia Postprocedure Evaluation (Signed)
Anesthesia Post Note  Patient: [name]  Procedure(s) Performed: Procedure(s) (LRB): SHUNT INSERTION VENTRICULAR-PERITONEAL (N/A)  Anesthesia type: General  Patient location: ICU  Post pain: Pain level controlled  Post assessment: Post-op Vital signs reviewed  Last Vitals:  Filed Vitals:   03/27/12 1600  BP: 154/81  Pulse: 72  Temp: 36.7 C  Resp: 13    Post vital signs: stable  Level of consciousness: Patient remains intubated per anesthesia plan  Complications: No apparent anesthesia complications

## 2012-03-27 NOTE — Transfer of Care (Signed)
Immediate Anesthesia Transfer of Care Note  Patient: [name]  Procedure(s) Performed: Procedure(s) (LRB): SHUNT INSERTION VENTRICULAR-PERITONEAL (N/A)  Patient Location: NICU  Anesthesia Type: General  Level of Consciousness: sedated, responds to stimulation and Patient remains intubated per anesthesia plan  Airway & Oxygen Therapy: Patient remains intubated per anesthesia plan  Post-op Assessment: Report given to NICU RN and Post -op Vital signs reviewed and stable  Post vital signs: Reviewed and stable  Complications: No apparent anesthesia complications

## 2012-03-27 NOTE — Anesthesia Preprocedure Evaluation (Addendum)
Anesthesia Evaluation  Patient identified by MRN, date of birth, ID band Patient unresponsive    Reviewed: Allergy & Precautions, H&P , NPO status , Patient's Chart, lab work & pertinent test results, reviewed documented beta blocker date and time   Airway Mallampati: II  Neck ROM: Full    Dental  (+) Dental Advisory Given and Poor Dentition   Pulmonary Current Smoker,    Pulmonary exam normal       Cardiovascular hypertension, Pt. on medications and Pt. on home beta blockers + CAD and + Cardiac Stents  EF 60%   Neuro/Psych    GI/Hepatic negative GI ROS, Neg liver ROS,   Endo/Other  negative endocrine ROS  Renal/GU Renal InsufficiencyRenal disease     Musculoskeletal   Abdominal   Peds  Hematology negative hematology ROS (+)   Anesthesia Other Findings   Reproductive/Obstetrics                         Anesthesia Physical Anesthesia Plan  ASA: IV  Anesthesia Plan: General   Post-op Pain Management:    Induction: Intravenous  Airway Management Planned: Oral ETT  Additional Equipment:   Intra-op Plan:   Post-operative Plan: Possible Post-op intubation/ventilation  Informed Consent: I have reviewed the patients History and Physical, chart, labs and discussed the procedure including the risks, benefits and alternatives for the proposed anesthesia with the patient or authorized representative who has indicated his/her understanding and acceptance.   Dental advisory given  Plan Discussed with: CRNA, Anesthesiologist and Surgeon  Anesthesia Plan Comments: (Discussed anesthestia with daughter Doneta Public) by cell phone.)        Anesthesia Quick Evaluation

## 2012-03-27 NOTE — Preoperative (Signed)
Beta Blockers   Reason not to administer Beta Blockers:Not Applicable 

## 2012-03-27 NOTE — Progress Notes (Signed)
Name: [name] MRN: [id number] DOB: 1961-04-24    LOS: 80  Referring Provider: Dr. Cyndy Freeze, neurosurgery Reason for Referral:  Reconsult for vent management  PULMONARY / CRITICAL CARE MEDICINE  Brief patient description: 51 yo with hx of HTN and previous ICH who presented [facility] ED on 5/15 with severe hypertension and acute SAH.  Events Since Admission:  5/15 Presented to [facility] ED hypertensive with nausea, vomiting, headache and was found to have [place]. Intubated. Ventriculostomy placed.  5/16 Extubated  5/17 Some hypoxia, off nicardipine drip now  5/18 Variable O2 needs, pcxr edema, tachy, trop neg  5/19 Pulled out ventriculostomy catheter, episodes of tachycardia  5/20 Briefly required Cardene, ventricular drain replaced  5/28 Back on nicardipine  6/1 ventric lowered  6/8 Patient pulled out ventric.  Shunt placed by neurosurgery.  Interval history:  Patient without major issues since CCM singed off on 03/23/12.  Earlier today became agitated and pulled ventriculostomy.  As neurosurgery was planning shunt, the elected to bring her to the OR today.  Remains intubated following OR.   Current Status: Critical  Vital Signs: Temp:  [96 F (35.6 C)-98.9 F (37.2 C)] 97.4 F (36.3 C) (06/08 2200) Pulse Rate:  [36-218] 62  (06/08 2200) Resp:  [13-23] 15  (06/08 2200) BP: (116-187)/(59-107) 153/78 mmHg (06/08 2200) SpO2:  [82 %-100 %] 100 % (06/08 2200) FiO2 (%):  [50 %] 50 % (06/08 2200)  Physical Examination: General:  Sedated and intubated Neuro:  Opens eyes to voice.  Not following commands.  Moves extremities spontaneously. HEENT:  ETT in place. PERRL. Neck: Supple. Cardiovascular:  RRR Lungs:  CTAB Abdomen:  Soft, NT Musculoskeletal:  Joints wnl. Skin:  No breakdown  Principal Problem:  *SAH (subarachnoid hemorrhage) Active Problems:  CAD, NATIVE VESSEL  DIASTOLIC HEART FAILURE, CHRONIC  CHRONIC KIDNEY DISEASE UNSPECIFIED  Acute respiratory failure  Acidosis  Severe  hypertension   ASSESSMENT AND PLAN  PULMONARY Ventilator Settings: Vent Mode:  [-] PRVC FiO2 (%):  [50 %] 50 % Set Rate:  [15 bmp] 15 bmp Vt Set:  [500 mL] 500 mL PEEP:  [5 cmH20] 5 cmH20 Plateau Pressure:  [20 cmH20] 20 cmH20 CXR:   Endotracheal tube terminates 3 cm above the carina.  Mild left basilar opacity, possibly atelectasis. Lungs are  otherwise clear. No pleural effusion or pneumothorax.  Cardiomediastinal silhouette is within normal limits.  Suspected VP shunt catheter coursing along the right hemithorax and  terminating in the right upper abdomen.  IMPRESSION:  Endotracheal tube terminates 3 cm above the carina.  ETT:   5/15 >>>5/16 6/8 >>>  A:  Remains on vent post-op. P:   - Continue above vent as above.  Wean as tolerated. - Consider SBT and evaluate for extubation in AM.   CARDIOVASCULAR TTE: 5/18 >>> Grade 2 diastolic disease, IVC dilated , EF 55%  A:  Hypertensive emergency - resolved.  Recurrent severe hypertensive.  History of CAD, CHF (diastolic).  SVT / sinus tachycardia  P:  - Cont clonidine 0.3 mg PO q8h  -Cont Labetalol to 300 mg PO q8h, with holding parameters for SBP of 150 or HR of 60.  - Cont hydralazine 50 mg PO q8h, increase as bradycardia times  - PRN IV hydralazine.  - Goal SBP < 180   RENAL  Lab 03/23/12 0400  NA 143  K 4.9  CL 110  CO2 21  BUN 33*  CREATININE 1.63*  CALCIUM 9.7  MG --  PHOS --   Intake/Output  06/08 0701 - 06/09 0700   P.O. 120   I.V. (mL/kg) 1346.2 (18.5)   IV Piggyback 205   Total Intake(mL/kg) 1671.2 (23)   Urine (mL/kg/hr) 1340 (1.2)   Drains 15   Blood 100   Total Output 1455   Net +216.2        Foley: 5/25 >>>  A: Acute on chronic renal failure, improving.  NAG acidosis - improved  P:  - Follow BMET   GASTROINTESTINAL  A:  No active issues. P:   Resume diet per SLP if extubated tomorrow. Otherwise will need to consider tube feeds.  HEMATOLOGIC  Lab 03/23/12 0400    HGB 11.1*  HCT 34.5*  PLT 368  INR --  APTT --   A: No active issues P:  DVTp - SCDs  INFECTIOUS  Lab 03/23/12 0400  WBC 8.9  PROCALCITON --   Cultures:  NA  Antibiotics:  Ancef 5/15 (per Neurosurgery) >>> 5/27  Vanc (per NS) 5/27 >>  Moxifloxacin (per NS) 5/27 >>  Ancef 6/8 >> 6/9 (2 doses, peri-operative)  A: No definite evidence of infection. P:   - Per-operative Ancef.  ENDOCRINE No results found for this basename: GLUCAP:5 in the last 168 hours A: Hyperglycemia, resolved  P:  - No intervention required  NEUROLOGIC Head CT:  5/16 >>> Diffuse SAH with ventriculomegaly  5/20 >>> Normal size ventricles  5/21 >>> Residual intraventricular hemorrhage, redevelopment of hydrocephalus  5/26 >> Moderate ventricular enlargement despite apparent satisfactory placement of intraventricular drainage catheter consistent with post subarachnoid hemorrhage hydrocephalus  6/1 >>Stable ventriculomegaly. Right CVP placement appears stable. Decreased small volume of intraventricular hemorrhage. No new intracranial hemorrhage. Continued diffuse cerebral edema. Stable basilar cisterns  6/4 >> Stable  Angio: 5/16 >>> Aneurysm coiled, 3 remain  TCD: 5/20 >>> Mild vasospasm, elevated ICP  5/27>>>Normal mean flow velocities in majority of identified vessels without evidence of vasospasm.  A: SAH, nontraumatic, s/p ventriculostomy (replaced 5/21). Hydrocephalus. Multiple aneurisms, 1 of 4 coiled.  Shunt placed 6/8. P:  - Shunt per neurosx - Sedation with propofol prn fentanyl.    BEST PRACTICE / DISPOSITION Level of Care:  ICU Primary Service:  Neurosurg Consultants:  CCM Code Status:  Full Diet:  NPO DVT Px:  SCDs GI Px:  Protonix Skin Integrity:  No issues Social / Family:  Family no available  [name], M.D. Pulmonary and [place] Pager: [phone]  CCC time 35 minutes.  03/27/2012, 10:36 PM

## 2012-03-28 NOTE — Progress Notes (Signed)
PT Cancellation Note  Treatment cancelled today due to medical issues with patient which prohibited therapy.Pt is currently vented. Spoke with RN who stated that they may attempt extubation today. Will attempt evaluation tomorrow pending medical stability.  Thanks, 03/28/2012 Ambrose Finland DPT PAGER: [phone] OFFICE: [phone]    Ambrose Finland 03/28/2012, 8:30 AM

## 2012-03-28 NOTE — Progress Notes (Signed)
Subjective: Patient reports intubated  Objective: Vital signs in last 24 hours: Temp:  [96 F (35.6 C)-98.9 F (37.2 C)] 97.6 F (36.4 C) (06/09 0400) Pulse Rate:  [36-218] 65  (06/09 0430) Resp:  [13-26] 15  (06/09 0430) BP: (100-187)/(50-107) 150/71 mmHg (06/09 0430) SpO2:  [82 %-100 %] 100 % (06/09 0430) FiO2 (%):  [30 %-50 %] 30 % (06/09 0430)  Intake/Output from previous day: 06/08 0701 - 06/09 0700 In: 2653.6 [P.O.:120; I.V.:2043.6; NG/GT:180; IV Piggyback:310] Out: 2025 [Urine:1910; Drains:15; Blood:100] Intake/Output this shift: Total I/O In: 1198.6 [I.V.:863.6; NG/GT:180; IV Piggyback:155] Out: 765 [Urine:765]  Physical Exam: Dressings CDI. F/C all 4 extremities.  Lab Results:  Basename 03/27/12 2210  WBC 8.0  HGB 11.4*  HCT 34.7*  PLT 233   BMET  Basename 03/27/12 2210  NA 141  K 3.7  CL 109  CO2 19  GLUCOSE 102*  BUN 27*  CREATININE 1.29*  CALCIUM 9.8    Studies/Results: Dg Chest Port 1 View  03/27/2012  *RADIOLOGY REPORT*  Clinical Data: Endotracheal tube position  PORTABLE CHEST - 1 VIEW  Comparison: 03/15/2012  Findings: Endotracheal tube terminates 3 cm above the carina.  Mild left basilar opacity, possibly atelectasis.  Lungs are otherwise clear.  No pleural effusion or pneumothorax.  Cardiomediastinal silhouette is within normal limits.  Suspected VP shunt catheter coursing along the right hemithorax and terminating in the right upper abdomen.  IMPRESSION: Endotracheal tube terminates 3 cm above the carina.  Original Report Authenticated By: Julian Hy, M.D.    Assessment/Plan: Wean vent per CCM.  Should be able to extubate today.  Doing well from standpoint of VP shunt.    LOS: 25 days    Peggyann Shoals, MD 03/28/2012, 4:57 AM

## 2012-03-28 NOTE — Progress Notes (Signed)
Name: [name] MRN: [id number] DOB: [birth year]    LOS: 71  Referring Provider: Dr. Cyndy Freeze, neurosurgery Reason for Referral:  Reconsult for vent management  PULMONARY / CRITICAL CARE MEDICINE  Brief patient description: 51 yo with hx of HTN and previous ICH who presented to [facility] ED on 5/15 with severe hypertension and acute SAH.  Events Since Admission:  5/15 Presented to [facility] ED hypertensive with nausea, vomiting, headache and was found to have [place]. Intubated. Ventriculostomy placed.  5/16 Extubated  5/17 Some hypoxia, off nicardipine drip now  5/18 Variable O2 needs, pcxr edema, tachy, trop neg  5/19 Pulled out ventriculostomy catheter, episodes of tachycardia  5/20 Briefly required Cardene, ventricular drain replaced  5/28 Back on nicardipine  6/1 ventric lowered  6/8 Patient pulled out ventric.  Rt V-P Shunt placed by neurosurgery.  Interval history:  Patient without major issues since CCM singed off on 03/23/12.  6/8 became agitated and pulled ventriculostomy.  As neurosurgery was planning shunt, the elected to bring her to the OR today.  Remains intubated following OR.   Current Status: Critical, brady 40s during wean  Vital Signs: Temp:  [96 F (35.6 C)-98.2 F (36.8 C)] 97.6 F (36.4 C) (06/09 0400) Pulse Rate:  [36-102] 65  (06/09 0817) Resp:  [13-26] 15  (06/09 0817) BP: (100-187)/(50-107) 154/77 mmHg (06/09 0630) SpO2:  [82 %-100 %] 100 % (06/09 0817) FiO2 (%):  [30 %-50 %] 30 % (06/09 0817)  Physical Examination: General:  Sedated and intubated Neuro:  Opens eyes to voice.  Not following commands.  Moves extremities spontaneously. HEENT:  ETT in place. PERRL. Neck: Supple. Cardiovascular:  RRR Lungs:  CTAB Abdomen:  Soft, NT Musculoskeletal:  Joints wnl. Skin:  No breakdown  Principal Problem:  *SAH (subarachnoid hemorrhage) Active Problems:  CAD, NATIVE VESSEL  DIASTOLIC HEART FAILURE, CHRONIC  CHRONIC KIDNEY DISEASE UNSPECIFIED  Acute respiratory  failure  Acidosis  Severe hypertension   ASSESSMENT AND PLAN  PULMONARY Ventilator Settings: Vent Mode:  [-] PRVC FiO2 (%):  [30 %-50 %] 30 % Set Rate:  [15 bmp] 15 bmp Vt Set:  [500 mL] 500 mL PEEP:  [5 cmH20] 5 cmH20 Plateau Pressure:  [19 cmH20-20 cmH20] 20 cmH20 CXR:   Endotracheal tube terminates 3 cm above the carina.  Mild left basilar opacity, possibly atelectasis. Lungs are  otherwise clear. No pleural effusion or pneumothorax.  Cardiomediastinal silhouette is within normal limits.  Suspected VP shunt catheter coursing along the right hemithorax and  terminating in the right upper abdomen.  IMPRESSION:  Endotracheal tube terminates 3 cm above the carina.  ETT:   5/15 >>>5/16 6/8 >>>  CXR 6/8 good ETT position A:  Remains on vent post-op. P:   -  Wean as tolerated. - SBT and evaluate for extubation if no further brady episodes   CARDIOVASCULAR TTE: 5/18 >>> Grade 2 diastolic disease, IVC dilated , EF 55%  A:  Hypertensive emergency - resolved.  Recurrent severe hypertensive.  History of CAD, CHF (diastolic).  SVT / sinus tachycardia  P:  - Cont clonidine 0.3 mg PO q8h  -Cont Labetalol to 300 mg PO q8h, with holding parameters for SBP of 150 or HR of 60.  - Cont hydralazine 50 mg PO q8h, increase as bradycardia times  - PRN IV hydralazine.  - Goal SBP < 180   RENAL  Lab 03/27/12 2210 03/23/12 0400  NA 141 143  K 3.7 4.9  CL 109 110  CO2 19 21  BUN  27* 33*  CREATININE 1.29* 1.63*  CALCIUM 9.8 9.7  MG -- --  PHOS -- --   Intake/Output      06/08 0701 - 06/09 0700 06/09 0701 - 06/10 0700   P.O. 120    I.V. (mL/kg) 2369.7 (32.6) 100 (1.4)   NG/GT 260 270   IV Piggyback 360    Total Intake(mL/kg) 3109.7 (42.7) 370 (5.1)   Urine (mL/kg/hr) 1970 (1.1)    Drains 15    Blood 100    Total Output 2085    Net +1024.7 +370         Foley: 5/25 >>>  A: Acute on chronic renal failure, improving.  NAG acidosis - improved  P:  - Follow BMET    GASTROINTESTINAL  A:  No active issues. P:   Resume diet per SLP if extubated. Otherwise will need to consider tube feeds.  HEMATOLOGIC  Lab 03/27/12 2210 03/23/12 0400  HGB 11.4* 11.1*  HCT 34.7* 34.5*  PLT 233 368  INR -- --  APTT -- --   A: No active issues P:  DVTp - SCDs  INFECTIOUS  Lab 03/27/12 2210 03/23/12 0400  WBC 8.0 8.9  PROCALCITON -- --   Cultures:  NA  Antibiotics:  Ancef 5/15 (per Neurosurgery) >>> 5/27  Vanc (per NS) 5/27 >> stopped Moxifloxacin (per NS) 5/27 >>  Ancef 6/8 >> 6/9 (2 doses, peri-operative)  A: No definite evidence of infection. P:   - Per-operative Ancef.  ENDOCRINE No results found for this basename: GLUCAP:5 in the last 168 hours A: Hyperglycemia, resolved  P:  - No intervention required  NEUROLOGIC Head CT:  5/16 >>> Diffuse SAH with ventriculomegaly  5/20 >>> Normal size ventricles  5/21 >>> Residual intraventricular hemorrhage, redevelopment of hydrocephalus  5/26 >> Moderate ventricular enlargement despite apparent satisfactory placement of intraventricular drainage catheter consistent with post subarachnoid hemorrhage hydrocephalus  6/1 >>Stable ventriculomegaly. Right CVP placement appears stable. Decreased small volume of intraventricular hemorrhage. No new intracranial hemorrhage. Continued diffuse cerebral edema. Stable basilar cisterns  6/4 >> Stable  Angio: 5/16 >>> Aneurysm coiled, 3 remain  TCD: 5/20 >>> Mild vasospasm, elevated ICP  5/27>>>Normal mean flow velocities in majority of identified vessels without evidence of vasospasm.  A: SAH, nontraumatic, s/p ventriculostomy (replaced 5/21). Hydrocephalus. Multiple aneurisms, 1 of 4 coiled.  Shunt placed 6/8. P:  - Shunt per neurosx - Sedation with propofol prn fentanyl in anticipation of extubation soon.   BEST PRACTICE / DISPOSITION Level of Care:  ICU Primary Service:  Neurosurg Consultants:  CCM Code Status:  Full Diet:  NPO DVT Px:  SCDs GI  Px:  Protonix Skin Integrity:  No issues Social / Family:  Family no available  Rigoberto Noel., M.D. Pulmonary and [place] Pager: [phone]  CCC time 31 minutes.  03/28/2012, 8:27 AM

## 2012-03-28 NOTE — Progress Notes (Signed)
SLP Cancellation Note  ST received order for SLE to assess cognitive-linguistic skills and not completed secondary to patient on vent. ST to follow for POC. Sharman Crate M.S., CCC-SLP [phone]  [facility] 03/28/2012, 11:11 AM

## 2012-03-29 ENCOUNTER — Inpatient Hospital Stay (HOSPITAL_COMMUNITY): Payer: No Typology Code available for payment source

## 2012-03-29 ENCOUNTER — Encounter (HOSPITAL_COMMUNITY): Payer: Self-pay | Admitting: Neurosurgery

## 2012-03-29 LAB — BASIC METABOLIC PANEL
CO2: 19 mEq/L (ref 19–32)
Calcium: 9.5 mg/dL (ref 8.4–10.5)
Chloride: 111 mEq/L (ref 96–112)
Potassium: 3.8 mEq/L (ref 3.5–5.1)
Sodium: 142 mEq/L (ref 135–145)

## 2012-03-29 LAB — CBC
HCT: 32.1 % — ABNORMAL LOW (ref 36.0–46.0)
MCV: 87 fL (ref 78.0–100.0)
Platelets: 199 10*3/uL (ref 150–400)
RBC: 3.69 MIL/uL — ABNORMAL LOW (ref 3.87–5.11)
WBC: 8.9 10*3/uL (ref 4.0–10.5)

## 2012-03-29 MED ORDER — HYDRALAZINE HCL 50 MG PO TABS
100.0000 mg | ORAL_TABLET | Freq: Three times a day (TID) | ORAL | Status: DC
  Administered 2012-03-29 – 2012-04-05 (×21): 100 mg via ORAL
  Filled 2012-03-29 (×24): qty 2

## 2012-03-29 MED ORDER — PANTOPRAZOLE SODIUM 40 MG PO PACK
40.0000 mg | PACK | Freq: Every day | ORAL | Status: DC
  Administered 2012-03-29 – 2012-04-04 (×7): 40 mg
  Filled 2012-03-29 (×8): qty 20

## 2012-03-29 MED ORDER — FENTANYL CITRATE 0.05 MG/ML IJ SOLN
25.0000 ug | INTRAMUSCULAR | Status: DC | PRN
  Administered 2012-03-29 – 2012-04-03 (×6): 25 ug via INTRAVENOUS
  Filled 2012-03-29 (×6): qty 2

## 2012-03-29 NOTE — Progress Notes (Signed)
Patient vomited small amount of light brown emesis. Patient was suctioned and CCM MD was notified. Will continue to monitor.

## 2012-03-29 NOTE — Procedures (Signed)
Extubation Procedure Note  Patient Details:   Name: [name] DOB: 1961/10/02 MRN: [id number]   Airway Documentation:     Evaluation  O2 sats: stable throughout Complications: No apparent complications Patient did tolerate procedure well. Bilateral Breath Sounds: Diminished Suctioning: Airway Yes  Beatris Si D 03/29/2012, 12:21 PM

## 2012-03-29 NOTE — Progress Notes (Signed)
Nutrition Follow-up  Diet Order:  NPO Pt was intubated over the weekend for VP shunt insertion after pt pulled her ventricular catheter. Pt remains intubated but plans to be extubated today. Prior to this pt has been refusing to eat and when she does eat a few bites she pockets the food per nursing. Pt had drank a little ensure but had stopped drinking this too.   Meds: Scheduled Meds:   . antiseptic oral rinse  15 mL Mouth Rinse QID  . chlorhexidine  15 mL Mouth Rinse BID  . cloNIDine  0.3 mg Oral Q8H  . feeding supplement  237 mL Oral QID  . hydrALAZINE  100 mg Oral Q8H  . labetalol  300 mg Oral TID  . levetiracetam  500 mg Intravenous Q12H  . pantoprazole (PROTONIX) IV  40 mg Intravenous QHS  . senna-docusate  1 tablet Oral BID  . DISCONTD: hydrALAZINE  75 mg Oral Q8H   Continuous Infusions:   . 0.9 % NaCl with KCl 20 mEq / L 20 mL (03/28/12 1422)  . niCARDipine Stopped (03/29/12 0805)  . propofol Stopped (03/29/12 0500)   PRN Meds:.acetaminophen, acetaminophen, acetaminophen-codeine, acetaminophen-codeine, bisacodyl, diazepam, fentaNYL, hydrALAZINE, labetalol, labetalol, ondansetron (ZOFRAN) IV, ondansetron, promethazine, DISCONTD: fentaNYL  Labs:  CMP     Component Value Date/Time   NA 142 03/29/2012 0415   K 3.8 03/29/2012 0415   CL 111 03/29/2012 0415   CO2 19 03/29/2012 0415   GLUCOSE 96 03/29/2012 0415   BUN 21 03/29/2012 0415   CREATININE 1.21* 03/29/2012 0415   CALCIUM 9.5 03/29/2012 0415   PROT 7.0 03/08/2012 0425   ALBUMIN 2.8* 03/08/2012 0425   AST 10 03/08/2012 0425   ALT <5 03/08/2012 0425   ALKPHOS 74 03/08/2012 0425   BILITOT 0.3 03/08/2012 0425   GFRNONAA 51* 03/29/2012 0415   GFRAA 59* 03/29/2012 0415  CBG (last 3)  No results found for this basename: GLUCAP:3 in the last 72 hours   Intake/Output Summary (Last 24 hours) at 03/29/12 1122 Last data filed at 03/29/12 1000  Gross per 24 hour  Intake 1318.3 ml  Output   1370 ml  Net  -51.7 ml    Weight  Status:   160 lbs 6/5 166 lbs 6/3  Estimated needs: Kcal: 1700-1900; Protein: 80-90 grams; Fluid: >1.7 L/day   Nutrition Dx: Inadequate oral intake now r/t lethargy AEB intake of < 50% of her meals; ongoing   Goal: Provide >/= 90% of her estimated needs; not met.   Intervention:    Recommend starting enteral nutrition support which can be adjusted based on pt's ability to swallow and how much she eats. Pt has consumed very little nutrition this admission, therefore pt is at risk for refeeding syndrome  Recommend starting Jevity 1.2 @ 20 ml/hr and increase by 10 ml every 12 hours to goal rate of 60 ml/hr (1728 kcal, 80 grams protein, 1166 ml H2O). Recommend checking refeeding labs for the first 3 days and replete as needed (phosphorus, magnesium, and potassium)  Monitor:  Diet advancement, po intake  Bernardino Dowell, [place] Pager #:  [phone]

## 2012-03-29 NOTE — Progress Notes (Signed)
PT Cancellation Note  Treatment cancelled today due to medical issues with patient which prohibited therapy.  Pt extubated today and RN request to hold until tomorrow.  [name] 03/29/2012, 2:27 PM [name], [place] DPT [phone]

## 2012-03-29 NOTE — Progress Notes (Signed)
Subjective: Patient assigned to me by Dr. Christella Noa, no sign out or report given to me. Patient continues on ventilator via endotracheal tube. Continues on Cardene drip for blood pressure control. Dressing clean and dry from recent surgery.   Objective: Vital signs in last 24 hours: Filed Vitals:   03/29/12 0430 03/29/12 0500 03/29/12 0600 03/29/12 0700  BP: 143/84 141/78 161/88 165/89  Pulse: 76 78 75 69  Temp:      TempSrc:      Resp: 15 15 15 18   Height:      Weight:      SpO2: 100% 100% 100% 100%    Intake/Output from previous day: 06/09 0701 - 06/10 0700 In: 1828.3 [I.V.:1028.3; NG/GT:800] Out: 2020 [Urine:1720; Emesis/NG output:300] Intake/Output this shift:    Physical Exam:  Awake and alert, following commands. Pupils: The left is 1.5 mm, the right is 3 mm, both round and reactive to light. Downgaze noted, but good lateral gaze to either side. Moving all 4 extremities to command.  CBC  Basename 03/29/12 0415 03/27/12 2210  WBC 8.9 8.0  HGB 10.4* 11.4*  HCT 32.1* 34.7*  PLT 199 233   BMET  Basename 03/29/12 0415 03/27/12 2210  NA 142 141  K 3.8 3.7  CL 111 109  CO2 19 19  GLUCOSE 96 102*  BUN 21 27*  CREATININE 1.21* 1.29*  CALCIUM 9.5 9.8    Studies/Results: Dg Chest Port 1 View  03/27/2012  *RADIOLOGY REPORT*  Clinical Data: Endotracheal tube position  PORTABLE CHEST - 1 VIEW  Comparison: 03/15/2012  Findings: Endotracheal tube terminates 3 cm above the carina.  Mild left basilar opacity, possibly atelectasis.  Lungs are otherwise clear.  No pleural effusion or pneumothorax.  Cardiomediastinal silhouette is within normal limits.  Suspected VP shunt catheter coursing along the right hemithorax and terminating in the right upper abdomen.  IMPRESSION: Endotracheal tube terminates 3 cm above the carina.  Original Report Authenticated By: Julian Hy, M.D.    Assessment/Plan: Continued care by CCM, may be able to be extubated later  today.   Hosie Spangle, MD 03/29/2012, 7:57 AM

## 2012-03-29 NOTE — Progress Notes (Signed)
Name: [name] MRN: [id number] DOB: [birth year]    LOS: 7  Referring Provider: Dr. Cyndy Freeze, neurosurgery Reason for Referral:  Reconsult for vent management  PULMONARY / CRITICAL CARE MEDICINE  Brief patient description: 51 yo with hx of HTN and previous ICH who presented to [facility] ED on 5/15 with severe hypertension and acute SAH.  Events Since Admission:  5/15 Presented to [facility] ED hypertensive with nausea, vomiting, headache and was found to have [place]. Intubated. Ventriculostomy placed.  5/16 Extubated  5/17 Some hypoxia, off nicardipine drip now  5/18 Variable O2 needs, pcxr edema, tachy, trop neg  5/19 Pulled out ventriculostomy catheter, episodes of tachycardia  5/20 Briefly required Cardene, ventricular drain replaced  5/28 Back on nicardipine  6/1 ventric lowered  6/8 Patient pulled out ventric.  Rt V-P Shunt placed by neurosurgery.  Interval history:  Patient without major issues since CCM singed off on 03/23/12.  6/8 became agitated and pulled ventriculostomy.  As neurosurgery was planning shunt, the elected to bring her to the OR today.  Remains intubated following OR.   Current Status:  Doing well on SBT AM 6/10, following commands this morning.  Vital Signs: Temp:  [96.9 F (36.1 C)-98.5 F (36.9 C)] 98.2 F (36.8 C) (06/10 0831) Pulse Rate:  [66-89] 84  (06/10 0945) Resp:  [15-24] 24  (06/10 0945) BP: (130-204)/(72-100) 164/78 mmHg (06/10 0945) SpO2:  [98 %-100 %] 100 % (06/10 0945) FiO2 (%):  [30 %] 30 % (06/10 0900)  Physical Examination:  Gen: sedated on vent HEENT: Scalp surgical wound well dressed, R pupil slightly greater than L, both round and reactive to light,  PULM: CTA B CV: RRR, no mgr, no JVD AB: BS+, soft, nontender, no hsm Ext: warm, trace edema, no clubbing, no cyanosis, scd's Derm: no rash or skin breakdown Neuro: sedated (just received fentanyl for pain) but by report was following commands this morning   Principal Problem:  *SAH  (subarachnoid hemorrhage) Active Problems:  CAD, NATIVE VESSEL  DIASTOLIC HEART FAILURE, CHRONIC  CHRONIC KIDNEY DISEASE UNSPECIFIED  Acute respiratory failure  Acidosis  Severe hypertension   ASSESSMENT AND PLAN  PULMONARY Ventilator Settings: Vent Mode:  [-] CPAP FiO2 (%):  [30 %] 30 % Set Rate:  [15 bmp] 15 bmp Vt Set:  [500 mL] 500 mL PEEP:  [5 cmH20] 5 cmH20 Plateau Pressure:  [18 cmH20-20 cmH20] 20 cmH20 CXR:    ETT:   5/15 >>>5/16 6/8 >>> 6/10  CXR 6/8 good ETT position  A:  Remains on vent post-op. P:   -  Extubate 6/10   CARDIOVASCULAR TTE: 5/18 >>> Grade 2 diastolic disease, IVC dilated , EF 55%  A:  Hypertensive emergency - resolved Recurrent severe hypertensive, still elevated 6/10 History of CAD, CHF (diastolic).  SVT / sinus tachycardia  P:  - Cont clonidine 0.3 mg PO q8h  -Cont Labetalol to 300 mg PO q8h, with holding parameters for SBP of 150 or HR of 60.  - Increase hydralazine to 100 mg PO q8h, increase as bradycardia times  - PRN IV hydralazine.  - Goal SBP < 180   RENAL  Lab 03/29/12 0415 03/27/12 2210 03/23/12 0400  NA 142 141 143  K 3.8 3.7 --  CL 111 109 110  CO2 19 19 21   BUN 21 27* 33*  CREATININE 1.21* 1.29* 1.63*  CALCIUM 9.5 9.8 9.7  MG -- -- --  PHOS -- -- --   Intake/Output      06/09 0701 -  06/10 0700 06/10 0701 - 06/11 0700   P.O.     I.V. (mL/kg) 1028.3 (14.1) 40 (0.5)   NG/GT 800    IV Piggyback     Total Intake(mL/kg) 1828.3 (25.1) 40 (0.5)   Urine (mL/kg/hr) 1720 (1) 65   Emesis/NG output 300    Drains     Blood     Total Output 2020 65   Net -191.7 -25         Foley: 5/25 >>>  A: Acute on chronic renal failure, improving.  NAG acidosis - improved  P:  - Follow BMET   GASTROINTESTINAL  A:  No active issues. P:   Resume diet per SLP today  HEMATOLOGIC  Lab 03/29/12 0415 03/27/12 2210 03/23/12 0400  HGB 10.4* 11.4* 11.1*  HCT 32.1* 34.7* 34.5*  PLT 199 233 368  INR -- -- --  APTT -- --  --   A: No active issues P:  DVTp - SCDs  INFECTIOUS  Lab 03/29/12 0415 03/27/12 2210 03/23/12 0400  WBC 8.9 8.0 8.9  PROCALCITON -- -- --   Cultures:  NA  Antibiotics:  Ancef 5/15 (per Neurosurgery) >>> 5/27  Vanc (per NS) 5/27 >> stopped Moxifloxacin (per NS) 5/27 >>  Ancef 6/8 >> 6/9 (2 doses, peri-operative)  A: No definite evidence of infection. P:   - Per-operative Ancef.  ENDOCRINE No results found for this basename: GLUCAP:5 in the last 168 hours A: Hyperglycemia, resolved  P:  - No intervention required  NEUROLOGIC Head CT:  5/16 >>> Diffuse SAH with ventriculomegaly  5/20 >>> Normal size ventricles  5/21 >>> Residual intraventricular hemorrhage, redevelopment of hydrocephalus  5/26 >> Moderate ventricular enlargement despite apparent satisfactory placement of intraventricular drainage catheter consistent with post subarachnoid hemorrhage hydrocephalus  6/1 >>Stable ventriculomegaly. Right CVP placement appears stable. Decreased small volume of intraventricular hemorrhage. No new intracranial hemorrhage. Continued diffuse cerebral edema. Stable basilar cisterns  6/4 >> Stable  Angio: 5/16 >>> Aneurysm coiled, 3 remain  TCD: 5/20 >>> Mild vasospasm, elevated ICP  5/27>>>Normal mean flow velocities in majority of identified vessels without evidence of vasospasm.  A: SAH, nontraumatic, s/p ventriculostomy (replaced 5/21). Hydrocephalus. Multiple aneurisms, 1 of 4 coiled.  Shunt placed 6/8. P:  - Shunt per neurosx - extubate 6/10 - d/c fentanyl dose to 25 as 4mcg made her over sedated   BEST PRACTICE / DISPOSITION Level of Care:  ICU Primary Service:  Neurosurg Consultants:  CCM Code Status:  Full Diet:  NPO DVT Px:  SCDs GI Px:  Protonix Skin Integrity:  No issues Social / Family:  Family no available  Simonne Maffucci, M.D. Pulmonary and [place] Pager: [phone]  CC time 40 minutes.  03/29/2012, 10:04  AM

## 2012-03-30 LAB — BASIC METABOLIC PANEL
BUN: 18 mg/dL (ref 6–23)
Calcium: 9.5 mg/dL (ref 8.4–10.5)
Chloride: 110 mEq/L (ref 96–112)
Creatinine, Ser: 1 mg/dL (ref 0.50–1.10)
GFR calc Af Amer: 75 mL/min — ABNORMAL LOW (ref >90)

## 2012-03-30 LAB — CBC
HCT: 33.8 % — ABNORMAL LOW (ref 36.0–46.0)
MCH: 28.1 pg (ref 26.0–34.0)
MCHC: 31.7 g/dL (ref 30.0–36.0)
MCV: 88.7 fL (ref 78.0–100.0)
Platelets: 195 10*3/uL (ref 150–400)
RDW: 14.7 % (ref 11.5–15.5)

## 2012-03-30 LAB — TRIGLYCERIDES: Triglycerides: 102 mg/dL (ref <150)

## 2012-03-30 MED ORDER — JEVITY 1.2 CAL PO LIQD
1000.0000 mL | ORAL | Status: DC
  Administered 2012-03-30 – 2012-04-02 (×2): 1000 mL
  Filled 2012-03-30 (×9): qty 1000

## 2012-03-30 MED ORDER — AMLODIPINE BESYLATE 10 MG PO TABS
10.0000 mg | ORAL_TABLET | Freq: Every day | ORAL | Status: DC
  Administered 2012-03-30 – 2012-04-05 (×7): 10 mg via ORAL
  Filled 2012-03-30 (×7): qty 1

## 2012-03-30 MED ORDER — NITROGLYCERIN 2 % TD OINT
1.0000 [in_us] | TOPICAL_OINTMENT | Freq: Four times a day (QID) | TRANSDERMAL | Status: DC | PRN
  Administered 2012-03-30 – 2012-04-02 (×4): 1 [in_us] via TOPICAL
  Filled 2012-03-30: qty 30

## 2012-03-30 NOTE — Progress Notes (Signed)
Name: [name] MRN: [id number] DOB: 1961-10-07    LOS: 27  Referring Provider: Dr. Cyndy Freeze, neurosurgery Reason for Referral:  Reconsult for vent management  PULMONARY / CRITICAL CARE MEDICINE  Brief patient description: 51 yo with hx of HTN and previous ICH who presented to [facility] ED on 5/15 with severe hypertension and acute SAH.  Events Since Admission:  5/15 Presented to [facility] ED hypertensive with nausea, vomiting, headache and was found to have [place]. Intubated. Ventriculostomy placed.  5/16 Extubated  5/17 Some hypoxia, off nicardipine drip now  5/18 Variable O2 needs, pcxr edema, tachy, trop neg  5/19 Pulled out ventriculostomy catheter, episodes of tachycardia  5/20 Briefly required Cardene, ventricular drain replaced  5/28 Back on nicardipine  6/1 ventric lowered  6/8 Patient pulled out ventric.  Rt V-P Shunt placed by neurosurgery. 6/10 Extubated again  Interval history:  Patient without major issues since CCM singed off on 03/23/12.  6/8 became agitated and pulled ventriculostomy, so VP shunt placed on 6/8 and she returned to 3100 intubated.  Extubated 6/10  Current Status:  Extubated 6/10, BP elevated overnight so on nicardipine gtt  Vital Signs: Temp:  [97.5 F (36.4 C)-98.2 F (36.8 C)] 97.5 F (36.4 C) (06/11 0400) Pulse Rate:  [64-92] 80  (06/11 0600) Resp:  [13-27] 27  (06/11 0600) BP: (129-204)/(68-105) 167/86 mmHg (06/11 0600) SpO2:  [98 %-100 %] 98 % (06/11 0600) FiO2 (%):  [30 %] 30 % (06/10 1200)  Physical Examination:  Gen: somnolent but arouses, slurred speech HEENT: Scalp surgical wound well dressed, EOMi, Panda in place PULM: CTA B CV: RRR, no mgr, no JVD AB: BS+, soft, nontender, no hsm Ext: warm, trace edema, no clubbing, no cyanosis, scd's Derm: no rash or skin breakdown Neuro: somnolent but arouses, slurred speech   Principal Problem:  *SAH (subarachnoid hemorrhage) Active Problems:  CAD, NATIVE VESSEL  DIASTOLIC HEART FAILURE, CHRONIC  CHRONIC KIDNEY DISEASE UNSPECIFIED  Acute respiratory failure  Acidosis  Severe hypertension   ASSESSMENT AND PLAN  PULMONARY Ventilator Settings: Vent Mode:  [-] CPAP FiO2 (%):  [30 %] 30 % PEEP:  [5 cmH20] 5 cmH20 CXR:    ETT:   5/15 >>>5/16 6/8 >>> 6/10  CXR 6/8 good ETT position  A:  Extubated 6/10 after VP shunt, aspiration risk due to mental status P:   -  Extubate 6/10   CARDIOVASCULAR TTE: 5/18 >>> Grade 2 diastolic disease, IVC dilated , EF 55%  A:  Hypertensive emergency - resolved Recurrent severe hypertensive, still elevated 6/10 History of CAD, CHF (diastolic).  SVT / sinus tachycardia  P:  - Cont clonidine 0.3 mg PO q8h  -Cont Labetalol 300 mg PO q8h, with holding parameters for SBP of 150 or HR of 60.  - Hydralazine 100 mg PO q8h  - Add home amlodipine - PRN IV hydralazine, add prn nitropaste - check renal artery ultrasound - Check aldosterone level, urinary VMA - Contact cardiologist today Aundra Dubin) to ask about hypertension - Goal SBP < 180   RENAL  Lab 03/30/12 0416 03/29/12 0415 03/27/12 2210  NA 142 142 141  K 4.2 3.8 --  CL 110 111 109  CO2 21 19 19   BUN 18 21 27*  CREATININE 1.00 1.21* 1.29*  CALCIUM 9.5 9.5 9.8  MG -- -- --  PHOS -- -- --   Intake/Output      06/10 0701 - 06/11 0700 06/11 0701 - 06/12 0700   I.V. (mL/kg) 902.3 (12.4)    NG/GT  270    IV Piggyback 210    Total Intake(mL/kg) 1382.3 (19)    Urine (mL/kg/hr) 1870 (1.1)    Emesis/NG output     Total Output 1870    Net -487.7          Foley: 5/25 >>>  A: Acute on chronic renal failure, improving.  NAG acidosis - improved  P:  - Follow BMET   GASTROINTESTINAL  A:  Lethargy precludes po intake. P:   Enteral tube feeds  HEMATOLOGIC  Lab 03/30/12 0416 03/29/12 0415 03/27/12 2210  HGB 10.7* 10.4* 11.4*  HCT 33.8* 32.1* 34.7*  PLT 195 199 233  INR -- -- --  APTT -- -- --   A: No active issues P:  DVTp - SCDs  INFECTIOUS  Lab 03/30/12 0416  03/29/12 0415 03/27/12 2210  WBC 7.6 8.9 8.0  PROCALCITON -- -- --   Cultures:  NA  Antibiotics:  Ancef 5/15 (per Neurosurgery) >>> 5/27  Vanc (per NS) 5/27 >> stopped Moxifloxacin (per NS) 5/27 >>  Ancef 6/8 >> 6/9 (2 doses, peri-operative)  A: No definite evidence of infection. P:   - Per-operative Ancef.  ENDOCRINE No results found for this basename: GLUCAP:5 in the last 168 hours A: Hyperglycemia, resolved  P:  - No intervention required  NEUROLOGIC Head CT:  5/16 >>> Diffuse SAH with ventriculomegaly  5/20 >>> Normal size ventricles  5/21 >>> Residual intraventricular hemorrhage, redevelopment of hydrocephalus  5/26 >> Moderate ventricular enlargement despite apparent satisfactory placement of intraventricular drainage catheter consistent with post subarachnoid hemorrhage hydrocephalus  6/1 >>Stable ventriculomegaly. Right CVP placement appears stable. Decreased small volume of intraventricular hemorrhage. No new intracranial hemorrhage. Continued diffuse cerebral edema. Stable basilar cisterns  6/4 >> Stable  Angio: 5/16 >>> Aneurysm coiled, 3 remain  TCD: 5/20 >>> Mild vasospasm, elevated ICP  5/27>>>Normal mean flow velocities in majority of identified vessels without evidence of vasospasm.  A: SAH, nontraumatic, s/p ventriculostomy (replaced 5/21). Hydrocephalus. Multiple aneurisms, 1 of 4 coiled.  Shunt placed 6/8. P:  - Shunt per neurosx - minimize sedating meds - Repeat CT head this week per NSGY   BEST PRACTICE / DISPOSITION Level of Care:  ICU Primary Service:  Neurosurg Consultants:  CCM Code Status:  Full Diet:  NPO DVT Px:  SCDs GI Px:  Protonix Skin Integrity:  No issues Social / Family:  Family no available  Simonne Maffucci, M.D. Pulmonary and [place] Pager: [phone]  CC time 40 minutes.  03/30/2012, 7:25 AM

## 2012-03-30 NOTE — Progress Notes (Signed)
Clinical Social Worker continues to follow to assist with dc planning as appropriate.  At this time, pt continues to not be able to participate in dc planning due to medical condition.  CSW to continue to follow and assist as needed.   Dala Dock, MSW, [place]

## 2012-03-30 NOTE — Evaluation (Signed)
Physical Therapy Evaluation Patient Details Name: [name] MRN: [id number] DOB: Jan 18, 1961 Today's Date: 03/30/2012 Time: [id number] PT Time Calculation (min): 30 min  PT Assessment / Plan / Recommendation Clinical Impression  Pt is 51 y/o female admitted 5/15 with severe hypertension and acute SAH.  Pt limited due to bedridden since admission due to acute respiratory failure and diffculty controlling BP.  Pt able to sit EOB on evaluation.  Will continue to assess and determine d/c plans as pt progress.    PT Assessment  Patient needs continued PT services    Follow Up Recommendations  Inpatient Rehab    Barriers to Discharge        lEquipment Recommendations  Defer to next venue    Recommendations for Other Services Rehab consult   Frequency Min 3X/week (increase frequency once pt able to tolerate)    Precautions / Restrictions Precautions Precautions: Fall   Pertinent Vitals/Pain Did not set      Mobility  Bed Mobility Bed Mobility: Supine to Sit;Sit to Supine Supine to Sit: 1: +2 Total assist Supine to Sit: Patient Percentage: 20% Sit to Supine: 1: +2 Total assist Sit to Supine: Patient Percentage: 10% Details for Bed Mobility Assistance: (A) with all mobility however able to initiate LE movement.  (A) to elevate trunk OOB and completely LE movement.  (A) to slowly descend trunk into bed    Exercises     PT Diagnosis: Difficulty walking;Abnormality of gait;Generalized weakness  PT Problem List: Decreased strength;Decreased range of motion;Decreased activity tolerance;Decreased balance;Decreased mobility;Decreased cognition;Decreased knowledge of use of DME PT Treatment Interventions: DME instruction;Gait training;Functional mobility training;Therapeutic activities;Therapeutic exercise;Balance training;Neuromuscular re-education;Patient/family education;Cognitive remediation   PT Goals Acute Rehab PT Goals PT Goal Formulation: Patient unable to participate in goal  setting Time For Goal Achievement: 04/13/12 Potential to Achieve Goals: Good Pt will go Supine/Side to Sit: with min assist PT Goal: Supine/Side to Sit - Progress: Goal set today Pt will Sit at Edge of Bed: with supervision;3-5 min PT Goal: Sit at Edge Of Bed - Progress: Goal set today Pt will go Sit to Supine/Side: with min assist PT Goal: Sit to Supine/Side - Progress: Goal set today Pt will go Sit to Stand: with +2 total assist (70%) PT Goal: Sit to Stand - Progress: Goal set today Pt will go Stand to Sit: with +2 total assist (70%) PT Goal: Stand to Sit - Progress: Goal set today Pt will Transfer Bed to Chair/Chair to Bed: with +2 total assist (70%) PT Transfer Goal: Bed to Chair/Chair to Bed - Progress: Goal set today Pt will Stand: 3 - 5 min;with +2 total assist (using sara plus) PT Goal: Stand - Progress: Goal set today Pt will Ambulate: 1 - 15 feet;with +2 total assist;Other (comment) (using sara plus) PT Goal: Ambulate - Progress: Goal set today  Visit Information  Last PT Received On: 03/30/12 Assistance Needed: +2    Subjective Data  Subjective: "What do you want to talk about?" Patient Stated Goal: Did not set   Prior Functioning  Home Living Lives With: Daughter (76 y/o) Available Help at Discharge: Family Type of Home: House Home Layout: One level Additional Comments: Limited history given due to lethargic and little verbalization when more aroused. Prior Function Level of Independence: Independent Able to Take Stairs?: Yes Driving: Yes Vocation: On disability Communication Communication: No difficulties    Cognition  Overall Cognitive Status: Impaired Area of Impairment: Attention;Awareness of deficits;Problem solving;Executive functioning Arousal/Alertness: Lethargic Orientation Level: Disoriented to;Place;Time;Situation Behavior During Session:  Lethargic Current Attention Level: Sustained Awareness of Deficits: Impaired awareness Problem Solving:  slow to process    Extremity/Trunk Assessment Right Lower Extremity Assessment RLE ROM/Strength/Tone: Deficits;Unable to fully assess;Due to impaired cognition RLE ROM/Strength/Tone Deficits: 3/5 gross RLE Sensation:  (unable to fully assess) Left Lower Extremity Assessment LLE ROM/Strength/Tone: Deficits;Unable to fully assess;Due to impaired cognition LLE ROM/Strength/Tone Deficits: at least 3/5 gross Trunk Assessment Trunk Assessment: Other exceptions (neck flexion and pectoralis tightness noticeable sitting EOB)   Balance Balance Balance Assessed: Yes Static Sitting Balance Static Sitting - Level of Assistance: 2: Max assist Static Sitting - Comment/# of Minutes: intially max (A) to maintain balance and able to progress to intermittent min (A) to maintain midline and upright posture.  Pt able to sit upright for ~8 minutes.  End of Session PT - End of Session Activity Tolerance: Patient limited by fatigue Patient left: in bed;with call bell/phone within reach Nurse Communication: Mobility status;Need for lift equipment   Jathniel Smeltzer 03/30/2012, 2:12 PM [place], [place] DPT [phone]

## 2012-03-30 NOTE — Evaluation (Signed)
Speech Language Pathology Evaluation Patient Details Name: [name] MRN: [id number] DOB: Nov 22, 1960 Today's Date: 03/30/2012 Time: 1050-1100 SLP Time Calculation (min): 10 min  Problem List:  Patient Active Problem List  Diagnoses  . CAD, NATIVE VESSEL  . DIASTOLIC HEART FAILURE, CHRONIC  . CHRONIC KIDNEY DISEASE UNSPECIFIED  . SAH (subarachnoid hemorrhage)  . Acute respiratory failure  . Acidosis  . Severe hypertension   Past Medical History:  Past Medical History  Diagnosis Date  . HTN (hypertension)     resistant  . Hypercholesterolemia   . Tobacco abuse     resumed smoking half a pack a day. She was a smoker in the past and states she was abl eto stop in the past using nicotine patches  . CAD (coronary artery disease)     Pt has St elevation MI in Feb 2009. Left hear tcath at tha ttime showed a 70% distal LAD lesion that was hazy consistent with a plaque rupture. She had a 50% distal circumflex stenosis an a 95% distal RCA stenosis. She did havve drug eluting stent placed in her distal RCA  . Chronic kidney disease     most recent creatinine 1.4 in 3/10  . Intracranial hemorrhage, spontaneous intraparenchymal, idiopathic, remote, resolved   . Diastolic heart failure     pt most recent echo wasin Feb 2009 w an EF of 60% and severe left ventricular hypertrophy. The pt did have evidence of diastolic dysfunction. The RV appeared normal   Past Surgical History:  Past Surgical History  Procedure Date  . Ventriculoperitoneal shunt 03/27/2012    Procedure: SHUNT INSERTION VENTRICULAR-PERITONEAL;  Surgeon: Winfield Cunas, MD;  Location: [facility];  Service: Neurosurgery;  Laterality: N/A;  Ventricular-Peritoneal Shunt Insertion   HPI:  51 y.o. 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 ETT 5/15-5/16. She still has 3 untreated aneurysms  which do need treatment per Dr. Christella Noa.   Followed briefly for swallowing.  6/8 - patient pulled out ventric.  Shunt placed by neurosurgery.  Extubated 6/10.  Orders for speech/language eval.  NPO with Panda tube feedings (pt with safe pharyngeal swallow at time of d/c from SLP - but with poor intake, poor initiation.)   Assessment / Plan / Recommendation Clinical Impression  Pt presents with limited initiation, reduced spontaneity - does not act upon environment to make needs known.  Inert quality.  Oriented to self; decreased focused/sustained attention.  Will assess higher-level cognitive functions as pt's ability/participation allows.    SLP Assessment  Patient needs continued Speech Language Pathology Services    Follow Up Recommendations  Other (comment) (tba)    Frequency and Duration min 2x/week  2 weeks       SLP Goals  SLP Goals Potential to Achieve Goals: Fair Progress/Goals/Alternative treatment plan discussed with pt/caregiver and they: Patient unable to parrticipate in goal setting SLP Goal #1: Pt will focus and sustain attention to functional task in nondistracting environment for 2 minutes with mod assist. SLP Goal #1 - Progress: Progressing toward goal SLP Goal #2: Pt will demonstrate improved verbal/physical response time in <15 seconds with mod assist. SLP Goal #3: Pt will utilize environmental cues to facilitate orientation to surroundings/time with mod assist.  Shynia Daleo L. Tivis Ringer, [place] CCC/SLP Pager [phone]  [name] 03/30/2012, 11:13 AM

## 2012-03-30 NOTE — Progress Notes (Signed)
Subjective: Patient resting in bed, extubated yesterday by [facility]. Continuing to require Cardene IV (in addition to 3 oral agents: clonidine, labetalol, hydralazine).  Objective: Vital signs in last 24 hours: Filed Vitals:   03/30/12 0400 03/30/12 0500 03/30/12 0600 03/30/12 0700  BP: 166/81 185/93 167/86 155/82  Pulse: 78 69 80 78  Temp: 97.5 F (36.4 C)     TempSrc: Oral     Resp: 18 20 27 24   Height:      Weight:      SpO2: 99% 100% 98% 97%    Intake/Output from previous day: 06/10 0701 - 06/11 0700 In: 1414.3 [I.V.:934.3; NG/GT:270; IV Piggyback:210] Out: 1870 [Urine:1870] Intake/Output this shift:    Physical Exam:   Patient lethargic, but oriented to name but not to place or year. Pupils round and reactive to light, left is 2 mm, right is 3 mm. Follows commands with all 4 extremities. Dressing is clean dry.   CBC  Basename 03/30/12 0416 03/29/12 0415  WBC 7.6 8.9  HGB 10.7* 10.4*  HCT 33.8* 32.1*  PLT 195 199   BMET  Basename 03/30/12 0416 03/29/12 0415  NA 142 142  K 4.2 3.8  CL 110 111  CO2 21 19  GLUCOSE 90 96  BUN 18 21  CREATININE 1.00 1.21*  CALCIUM 9.5 9.5    Studies/Results: Dg Abd Portable 1v  03/29/2012  *RADIOLOGY REPORT*  Clinical Data: Evaluate feeding tube placement  PORTABLE ABDOMEN - 1 VIEW  Comparison: 03/29/2012  Findings: Feeding tube is looped within the stomach.  The tip is within the proximal stomach near the GE junction.  There is a ventriculoperitoneal shunt with tubing coiled in the right upper quadrant of the abdomen.  The bowel gas pattern appears nonobstructed.  IMPRESSION:  1.  The feeding tube is coiled within the stomach with tip in the proximal stomach.  Original Report Authenticated By: Angelita Ingles, M.D.   Dg Abd Portable 1v  03/29/2012  *RADIOLOGY REPORT*  Clinical Data: Feeding tube placement.  PORTABLE ABDOMEN - 1 VIEW  Comparison: None.  Findings: Feeding tube projects over the distal descending duodenum.  Shunt  catheter terminates the right upper quadrant.  Gas is seen in the colon and stomach.  IMPRESSION: Feeding tube terminates in the descending duodenum.  Original Report Authenticated By: Luretha Rued, M.D.    Assessment/Plan: Discussed case with Dr. Lake Bells from [facility]. We feel that further blood pressure control is needed, and he is going to restart her Norvasc which she had been on as an outpatient. He is also going to initiate a workup regarding her poorly controlled hypertension including renal artery ultrasound, urine VMA, and aldosterone level. He is also going to discuss her case with Dr. Aundra Dubin from [facility] cardiology who has followed patient previously as an outpatient, and who had been managing her hypertension prior to admission. We also discussed restarting nutrition via her panda tube, he will place orders. We will plan on checking followup CT of the brain later this week.   Hosie Spangle, MD 03/30/2012, 8:05 AM

## 2012-03-30 NOTE — Progress Notes (Signed)
Nutrition Follow-up  Diet Order:  NPO Pt extubated but continues to have a decreased mental status. MD agreed to place feeding tube and start nutrition. TF management order received. Pt remains at high risk for refeeding syndrome due to prolonged period without adequate nutrition.   Meds: Scheduled Meds:   . amLODipine  10 mg Oral Daily  . antiseptic oral rinse  15 mL Mouth Rinse QID  . chlorhexidine  15 mL Mouth Rinse BID  . cloNIDine  0.3 mg Oral Q8H  . hydrALAZINE  100 mg Oral Q8H  . labetalol  300 mg Oral TID  . levetiracetam  500 mg Intravenous Q12H  . pantoprazole sodium  40 mg Per Tube Q1200  . senna-docusate  1 tablet Oral BID  . DISCONTD: feeding supplement  237 mL Oral QID  . DISCONTD: pantoprazole (PROTONIX) IV  40 mg Intravenous QHS   Continuous Infusions:   . 0.9 % NaCl with KCl 20 mEq / L 20 mL/hr at 03/29/12 2000  . niCARDipine Stopped (03/30/12 1009)  . DISCONTD: propofol Stopped (03/29/12 0500)   PRN Meds:.acetaminophen, acetaminophen, acetaminophen-codeine, acetaminophen-codeine, bisacodyl, diazepam, fentaNYL, hydrALAZINE, labetalol, labetalol, nitroGLYCERIN, ondansetron (ZOFRAN) IV, ondansetron, promethazine  Labs:  CMP     Component Value Date/Time   NA 142 03/30/2012 0416   K 4.2 03/30/2012 0416   CL 110 03/30/2012 0416   CO2 21 03/30/2012 0416   GLUCOSE 90 03/30/2012 0416   BUN 18 03/30/2012 0416   CREATININE 1.00 03/30/2012 0416   CALCIUM 9.5 03/30/2012 0416   PROT 7.0 03/08/2012 0425   ALBUMIN 2.8* 03/08/2012 0425   AST 10 03/08/2012 0425   ALT <5 03/08/2012 0425   ALKPHOS 74 03/08/2012 0425   BILITOT 0.3 03/08/2012 0425   GFRNONAA 65* 03/30/2012 0416   GFRAA 75* 03/30/2012 0416  CBG (last 3)  No results found for this basename: GLUCAP:3 in the last 72 hours Phosphorus  Date/Time Value Range Status  03/17/2012  3:30 AM 4.2  2.3-4.6 (mg/dL) Final   Magnesium  Date/Time Value Range Status  03/17/2012  3:30 AM 2.0  1.5-2.5 (mg/dL) Final    Intake/Output  Summary (Last 24 hours) at 03/30/12 1147 Last data filed at 03/30/12 1100  Gross per 24 hour  Intake 1633.09 ml  Output   1980 ml  Net -346.91 ml    Weight Status:  No new weight 160 lbs 6/5  Estimated needs: Kcal: 1700-1900; Protein: 80-90 grams; Fluid: >1.7 L/day   Nutrition Dx: Inadequate oral intake now r/t lethargy AEB intake of < 50% of her meals; ongoing   Goal: Provide >/= 90% of her estimated needs; not met.   Intervention:    Jevity 1.2 @ 20 ml/hr and increase by 10 ml every 12 hours to goal rate of 60 ml/hr (1728 kcal, 80 grams protein, 1166 ml H2O).   Will check refeeding labs for the first 3 days and replete as needed (phosphorus, magnesium, and potassium)   Monitor:  TF tolerance, labs, refeeding labs, ability to resume diet/po intake   Hetvi Shawhan, [place] Pager #:  [phone]

## 2012-03-30 NOTE — Progress Notes (Signed)
I called Vascular Lab at 1400 to see when pt would have her Renal Artery Duplex. I was told that she would be done today, and that she needed to be NPO. (I did not get the name of who I spoke with). At 16:45, I recalled the vascular lab, and I spoke with [facility]. She stated that the Probe was at [facility] and that the patient would be done tomorrow, by 10AM.

## 2012-03-30 NOTE — Evaluation (Signed)
Occupational Therapy Evaluation Patient Details Name: [name] MRN: [id number] DOB: January 02, 1961 Today's Date: 03/30/2012 Time: [id number] OT Time Calculation (min): 36 min  OT Assessment / Plan / Recommendation Clinical Impression  This 51 y.o. female admitted with hypertensive crisis and ICH .  She has had prolonged hospital stay.  She demonstrates the below listed deficits and will benefit from OT to maximize safety and independence with ADLs.  She would be an excellent CIR candidate if family able to provide 24 hour assistance at discharge.      OT Assessment  Patient needs continued OT Services    Follow Up Recommendations  Inpatient Rehab;Supervision/Assistance - 24 hour    Barriers to Discharge  (unknown)    Equipment Recommendations  Defer to next venue    Recommendations for Other Services Rehab consult  Frequency  Min 3X/week    Precautions / Restrictions Precautions Precautions: Fall Restrictions Weight Bearing Restrictions: No       ADL  Eating/Feeding: NPO Grooming: Performed;Wash/dry hands;Wash/dry face;Supervision/safety (apply lotion to hands) Where Assessed - Grooming: Supine, head of bed up Upper Body Bathing: Simulated;Moderate assistance Where Assessed - Upper Body Bathing: Supine, head of bed up Lower Body Bathing: Simulated;Maximal assistance Where Assessed - Lower Body Bathing: Supine, head of bed up;Rolling right and/or left Upper Body Dressing: Simulated;+1 Total assistance Where Assessed - Upper Body Dressing: Supported sitting Lower Body Dressing: Simulated;+1 Total assistance Where Assessed - Lower Body Dressing: Supine, head of bed up;Supine, head of bed flat;Rolling right and/or left ADL Comments: Pt. applied lotion to hands, then donned mittens.  Pt.     OT Diagnosis: Generalized weakness;Cognitive deficits;Disturbance of vision  OT Problem List: Decreased strength;Decreased range of motion;Decreased activity tolerance;Impaired balance  (sitting and/or standing);Impaired vision/perception;Decreased coordination;Decreased cognition;Decreased safety awareness;Decreased knowledge of use of DME or AE;Cardiopulmonary status limiting activity;Impaired UE functional use OT Treatment Interventions: Self-care/ADL training;Therapeutic exercise;Neuromuscular education;DME and/or AE instruction;Manual therapy;Therapeutic activities;Cognitive remediation/compensation;Visual/perceptual remediation/compensation;Patient/family education;Balance training   OT Goals Acute Rehab OT Goals OT Goal Formulation: Patient unable to participate in goal setting Time For Goal Achievement: 04/13/12 Potential to Achieve Goals: Good ADL Goals Pt Will Perform Grooming: with min assist;Sitting, edge of bed ADL Goal: Grooming - Progress: Goal set today Pt Will Perform Upper Body Bathing: with min assist;Sitting, edge of bed ADL Goal: Upper Body Bathing - Progress: Goal set today Pt Will Perform Lower Body Bathing: with mod assist;Sit to stand from chair ADL Goal: Lower Body Bathing - Progress: Goal set today Pt Will Transfer to Toilet: with mod assist;Stand pivot transfer;3-in-1 ADL Goal: Toilet Transfer - Progress: Goal set today Additional ADL Goal #1: Pt. will demonstrate bil. UE strength 3+/5 shoulders; 4/5 elbows and hands ADL Goal: Additional Goal #1 - Progress: Goal set today Additional ADL Goal #2: Pt. will be oriented to place and situation with the use of external cues ADL Goal: Additional Goal #2 - Progress: Goal set today  Visit Information  Last OT Received On: 03/30/12 Assistance Needed: +2 (for OOB; +1 to EOB)    Subjective Data  Subjective: "I'm 42" Patient Stated Goal: Pt. unable to state   Prior [place] Lives With: Daughter Available Help at Discharge: Family;Available 24 hours/day (per RN, family reports 24 hour assist is available) Type of Home: House Home Access: Stairs to enter [employer] of  Steps: 4 Entrance Stairs-Rails: Can reach both Home Layout: One level Bathroom Shower/Tub: Tub/shower unit;Door Additional Comments: Family not available to confirm living environment Prior Function Level of Independence: Independent  Able to Take Stairs?: Yes Driving: Yes Vocation: On disability Communication Communication: No difficulties Dominant Hand: Left    Cognition  Overall Cognitive Status: Impaired Area of Impairment: Attention;Memory;Awareness of errors;Awareness of deficits;Problem solving Arousal/Alertness: Lethargic Orientation Level: Disoriented to;Place;Time;Situation Behavior During Session: Flat affect Current Attention Level: Selective Memory Deficits: Pt. unable to recall working with PT earlier today and sitting EOB Awareness of Errors: Assistance required to identify errors made Awareness of Deficits: Impaired awareness Problem Solving: slow to process    Extremity/Trunk Assessment Right Upper Extremity Assessment RUE ROM/Strength/Tone: Deficits (tremulous) RUE ROM/Strength/Tone Deficits: AROM 80-90* shoulder elevation:  shoulder strength 2+/5; elbow 3+/5; hand 3-/5 RUE Coordination: Deficits RUE Coordination Deficits: due to weakness Left Upper Extremity Assessment LUE ROM/Strength/Tone: Deficits (Tremulous) LUE ROM/Strength/Tone Deficits: AROM shoulder elevation 80-90*, shoulder 2+/5; elbow 3+/5; hand 3-/5 LUE Coordination: Deficits LUE Coordination Deficits: due to weakness  Trunk Assessment Trunk Assessment: Other exceptions Trunk Exceptions: Pt. maintains lumbar, thoracic, and cervical flexion - limited active extension noted.  Tightness noted pecs and neck flexors   Mobility Bed Mobility Bed Mobility: Supine to Sit;Sit to Supine Supine to Sit: 3: Mod assist;HOB elevated (40*)  Sit to Supine: 3: Mod assist;HOB elevated (40*)  Details for Bed Mobility Assistance: cues to initiate movement   Exercise       End of Session OT - End of  Session Activity Tolerance: Patient tolerated treatment well Patient left: in bed;with call bell/phone within reach Nurse Communication: Mobility status   Hezikiah Retzloff, [place] Artis M 03/30/2012, 4:21 PM

## 2012-03-31 DIAGNOSIS — E46 Unspecified protein-calorie malnutrition: Secondary | ICD-10-CM

## 2012-03-31 DIAGNOSIS — I10 Essential (primary) hypertension: Secondary | ICD-10-CM

## 2012-03-31 LAB — CBC
HCT: 31.6 % — ABNORMAL LOW (ref 36.0–46.0)
Platelets: 178 10*3/uL (ref 150–400)
RBC: 3.57 MIL/uL — ABNORMAL LOW (ref 3.87–5.11)
RDW: 14.3 % (ref 11.5–15.5)
WBC: 6.3 10*3/uL (ref 4.0–10.5)

## 2012-03-31 LAB — RENAL FUNCTION PANEL
Albumin: 2.9 g/dL — ABNORMAL LOW (ref 3.5–5.2)
Calcium: 9.5 mg/dL (ref 8.4–10.5)
Creatinine, Ser: 1.19 mg/dL — ABNORMAL HIGH (ref 0.50–1.10)
GFR calc non Af Amer: 52 mL/min — ABNORMAL LOW (ref >90)
Phosphorus: 4.8 mg/dL — ABNORMAL HIGH (ref 2.3–4.6)
Sodium: 142 mEq/L (ref 135–145)

## 2012-03-31 LAB — BASIC METABOLIC PANEL
CO2: 22 mEq/L (ref 19–32)
Calcium: 9.5 mg/dL (ref 8.4–10.5)
Chloride: 107 mEq/L (ref 96–112)
Creatinine, Ser: 1.12 mg/dL — ABNORMAL HIGH (ref 0.50–1.10)
GFR calc Af Amer: 65 mL/min — ABNORMAL LOW (ref >90)
Sodium: 141 mEq/L (ref 135–145)

## 2012-03-31 LAB — MAGNESIUM: Magnesium: 1.6 mg/dL (ref 1.5–2.5)

## 2012-03-31 MED ORDER — LOSARTAN POTASSIUM 25 MG PO TABS
25.0000 mg | ORAL_TABLET | Freq: Every day | ORAL | Status: DC
  Administered 2012-03-31 – 2012-04-03 (×4): 25 mg via ORAL
  Filled 2012-03-31 (×4): qty 1

## 2012-03-31 MED ORDER — SPIRONOLACTONE 25 MG PO TABS
25.0000 mg | ORAL_TABLET | Freq: Every day | ORAL | Status: DC
  Administered 2012-03-31 – 2012-04-05 (×6): 25 mg via ORAL
  Filled 2012-03-31 (×6): qty 1

## 2012-03-31 NOTE — Progress Notes (Signed)
1001 Patient has 7 beat run of VTACH.  Dr. Lake Bells advised.  Cards consult has been ordered.

## 2012-03-31 NOTE — Progress Notes (Signed)
RN informed Elink that pt had an 8 beat run of Vtach and that pt was still waiting on a Cardiology consult.

## 2012-03-31 NOTE — Progress Notes (Signed)
24 hour urine taken to lab

## 2012-03-31 NOTE — Progress Notes (Signed)
Name: [name] MRN: [id number] DOB: March 22, 1961    LOS: 57  Referring Provider: Dr. Cyndy Freeze, neurosurgery Reason for Referral:  Reconsult for vent management  PULMONARY / CRITICAL CARE MEDICINE  Brief patient description: 51 yo with hx of HTN and previous ICH who presented to [facility] ED on 5/15 with severe hypertension and acute SAH.  Events Since Admission:  5/15 Presented to [facility] ED hypertensive with nausea, vomiting, headache and was found to have [place]. Intubated. Ventriculostomy placed.  5/16 Extubated  5/17 Some hypoxia, off nicardipine drip now  5/18 Variable O2 needs, pcxr edema, tachy, trop neg  5/19 Pulled out ventriculostomy catheter, episodes of tachycardia  5/20 Briefly required Cardene, ventricular drain replaced  5/28 Back on nicardipine  6/1 ventric lowered  6/8 Patient pulled out ventric.  Rt V-P Shunt placed by neurosurgery. 6/10 Extubated again  Interval history:  Patient without major issues since CCM singed off on 03/23/12.  6/8 became agitated and pulled ventriculostomy, so VP shunt placed on 6/8 and she returned to 3100 intubated.  Extubated 6/10  Current Status:  More alert today, nicardipine drip back on for hypertension  Vital Signs: Temp:  [98.6 F (37 C)-99 F (37.2 C)] 98.8 F (37.1 C) (06/12 0800) Pulse Rate:  [63-80] 76  (06/12 0900) Resp:  [16-27] 26  (06/12 0900) BP: (136-212)/(70-108) 159/76 mmHg (06/12 0900) SpO2:  [96 %-99 %] 96 % (06/12 0900)  Physical Examination:  Gen: more awake and alert, speech slow but clear HEENT: Scalp surgical wound well dressed, EOMi, Panda in place PULM: CTA B CV: RRR, no mgr, no JVD AB: BS+, soft, nontender, no hsm Ext: warm, trace edema, no clubbing, no cyanosis, scd's Derm: no rash or skin breakdown Neuro: more alert, speech slow but clear   Principal Problem:  *SAH (subarachnoid hemorrhage) Active Problems:  CAD, NATIVE VESSEL  DIASTOLIC HEART FAILURE, CHRONIC  CHRONIC KIDNEY DISEASE UNSPECIFIED  Acute respiratory failure  Acidosis  Severe hypertension   ASSESSMENT AND PLAN  PULMONARY Ventilator Settings:   CXR:    ETT:   5/15 >>>5/16 6/8 >>> 6/10  CXR 6/8 good ETT position  A:  Extubated 6/10 after VP shunt, aspiration risk due to mental status P:   -  Extubated 6/10 -  Aspiration precautions -  Have speech see her again today   CARDIOVASCULAR TTE: 5/18 >>> Grade 2 diastolic disease, IVC dilated , EF 55%  A:  Hypertensive emergency - resolved Recurrent severe hypertensive, still elevated 6/12 History of CAD, CHF (diastolic).  SVT / sinus tachycardia  P:  - Cont clonidine 0.3 mg PO q8h  -Cont Labetalol 300 mg PO q8h, with holding parameters for SBP of 150 or HR of 60.  - Hydralazine 100 mg PO q8h  - Amlodipine 10mg  po daily - Add spironolactone 25 mg daily - Add losartan 25 mg daily  - PRN IV hydralazine, prn nitropaste - wean off nicardinpine - Discussed with cardiologist Dr. Aundra Dubin who agrees with plan above - f/u renal artery ultrasound - f/u aldosterone level, urinary VMA - Goal SBP < 180   RENAL  Lab 03/31/12 0340 03/30/12 0416 03/29/12 0415 03/27/12 2210  NA 141 142 142 141  K 4.5 4.2 -- --  CL 107 110 111 109  CO2 22 21 19 19   BUN 19 18 21  27*  CREATININE 1.12* 1.00 1.21* 1.29*  CALCIUM 9.5 9.5 9.5 9.8  MG 1.6 -- -- --  PHOS 4.4 -- -- --   Intake/Output  06/11 0701 - 06/12 0700 06/12 0701 - 06/13 0700   I.V. (mL/kg) 538.8 (7.4) 40 (0.5)   NG/GT 580    IV Piggyback 210    Total Intake(mL/kg) 1328.8 (18.3) 40 (0.5)   Urine (mL/kg/hr) 1600 (0.9) 125   Total Output 1600 125   Net -271.3 -85         Foley: 5/25 >>>  A: Acute on chronic renal failure, improving.  NAG acidosis - improved  P:  - Follow BMET   GASTROINTESTINAL  A:  Lethargy precludes po intake. P:   Enteral tube feeds  HEMATOLOGIC  Lab 03/31/12 0340 03/30/12 0416 03/29/12 0415 03/27/12 2210  HGB 10.2* 10.7* 10.4* 11.4*  HCT 31.6* 33.8* 32.1* 34.7*    PLT 178 195 199 233  INR -- -- -- --  APTT -- -- -- --   A: No active issues P:  DVTp - SCDs  INFECTIOUS  Lab 03/31/12 0340 03/30/12 0416 03/29/12 0415 03/27/12 2210  WBC 6.3 7.6 8.9 8.0  PROCALCITON -- -- -- --   Cultures:  NA  Antibiotics:  Ancef 5/15 (per Neurosurgery) >>> 5/27  Vanc (per NS) 5/27 >> stopped Moxifloxacin (per NS) 5/27 >>  Ancef 6/8 >> 6/9 (2 doses, peri-operative)  A: No definite evidence of infection. P:   - monitor for fever, signs of infection  ENDOCRINE No results found for this basename: GLUCAP:5 in the last 168 hours A: Hyperglycemia, resolved  P:  - No intervention required  NEUROLOGIC Head CT:  5/16 >>> Diffuse SAH with ventriculomegaly  5/20 >>> Normal size ventricles  5/21 >>> Residual intraventricular hemorrhage, redevelopment of hydrocephalus  5/26 >> Moderate ventricular enlargement despite apparent satisfactory placement of intraventricular drainage catheter consistent with post subarachnoid hemorrhage hydrocephalus  6/1 >>Stable ventriculomegaly. Right CVP placement appears stable. Decreased small volume of intraventricular hemorrhage. No new intracranial hemorrhage. Continued diffuse cerebral edema. Stable basilar cisterns  6/4 >> Stable  Angio: 5/16 >>> Aneurysm coiled, 3 remain  TCD: 5/20 >>> Mild vasospasm, elevated ICP  5/27>>>Normal mean flow velocities in majority of identified vessels without evidence of vasospasm.  A: SAH, nontraumatic, s/p ventriculostomy (replaced 5/21). Hydrocephalus. Multiple aneurisms, 1 of 4 coiled.  Shunt placed 6/8. P:  - Shunt per neurosx - minimize sedating meds - Repeat CT head 6/13 per [place] / DISPOSITION Level of Care:  ICU Primary Service:  Neurosurg Consultants:  CCM Code Status:  Full Diet:  NPO DVT Px:  SCDs GI Px:  Protonix Skin Integrity:  No issues Social / Family:  Family no available  [name], M.D. Pulmonary and [place] Pager: [phone]  CC time 35 minutes.  03/31/2012, 9:34 AM

## 2012-03-31 NOTE — Progress Notes (Signed)
Nurse Secretary informed RN that pt just had an 8 beat vtach run. Agricultural consultant informed of Vtach run.

## 2012-03-31 NOTE — Progress Notes (Signed)
*  PRELIMINARY RESULTS* Vascular Ultrasound Renal Artery Duplex has been completed.  There is evidence of an atypical waveform throughout the right renal artery with the exception of the distal segment. There is no obvious evidence of left renal artery stenosis.  Incidental finding: left renal cyst measuring 1.1cm.  Olivia Canter, RDMS, RDCS 03/31/2012, 1:19 PM

## 2012-03-31 NOTE — Progress Notes (Signed)
Subjective: Patient resting in bed comfortably. Cardene drip restarted because of continued poorly controlled hypertension. For renal artery ultrasound today. Urine being collected for urine VMA. Tube feedings currently on hold because vascular lab says patient is to be n.p.o. for renal ultrasound.  Objective: Vital signs in last 24 hours: Filed Vitals:   03/31/12 0615 03/31/12 0630 03/31/12 0645 03/31/12 0700  BP: 212/108 197/89 211/85 194/81  Pulse: 74 70 71 72  Temp:      TempSrc:      Resp: 26 27 23 26   Height:      Weight:      SpO2: 97% 97% 98% 97%    Intake/Output from previous day: 06/11 0701 - 06/12 0700 In: 1328.8 [I.V.:538.8; NG/GT:580; IV Piggyback:210] Out: 1600 [Urine:1600] Intake/Output this shift:    Physical Exam:  Awake and alert, following commands slowly. Mild anisocoria persists. Extra ocular movements intact. Moves all 4 extremities. Dressing is clean and dry.  CBC  Basename 03/31/12 0340 03/30/12 0416  WBC 6.3 7.6  HGB 10.2* 10.7*  HCT 31.6* 33.8*  PLT 178 195   BMET  Basename 03/31/12 0340 03/30/12 0416  NA 141 142  K 4.5 4.2  CL 107 110  CO2 22 21  GLUCOSE 110* 90  BUN 19 18  CREATININE 1.12* 1.00  CALCIUM 9.5 9.5     Assessment/Plan: We'll request CT brain without contrast in a.m. to check ventricular size. Dr. Lake Bells continue to work on establishing a effective oral antihypertensive regimen. Case discussed again with Dr. Lake Bells today. Dr. Christella Noa remains out of town, I will be leaving town after today, Dr. Vertell Limber to cover until Dr. Christella Noa returns.   Hosie Spangle, MD 03/31/2012, 8:12 AM

## 2012-04-01 ENCOUNTER — Encounter (HOSPITAL_COMMUNITY): Payer: Self-pay | Admitting: Nurse Practitioner

## 2012-04-01 ENCOUNTER — Inpatient Hospital Stay (HOSPITAL_COMMUNITY): Payer: No Typology Code available for payment source

## 2012-04-01 DIAGNOSIS — E46 Unspecified protein-calorie malnutrition: Secondary | ICD-10-CM

## 2012-04-01 DIAGNOSIS — I10 Essential (primary) hypertension: Secondary | ICD-10-CM

## 2012-04-01 DIAGNOSIS — I609 Nontraumatic subarachnoid hemorrhage, unspecified: Secondary | ICD-10-CM

## 2012-04-01 LAB — BASIC METABOLIC PANEL
BUN: 22 mg/dL (ref 6–23)
Creatinine, Ser: 1.15 mg/dL — ABNORMAL HIGH (ref 0.50–1.10)
GFR calc Af Amer: 63 mL/min — ABNORMAL LOW (ref >90)
GFR calc non Af Amer: 55 mL/min — ABNORMAL LOW (ref >90)

## 2012-04-01 LAB — MAGNESIUM: Magnesium: 1.8 mg/dL (ref 1.5–2.5)

## 2012-04-01 LAB — PHOSPHORUS: Phosphorus: 4.8 mg/dL — ABNORMAL HIGH (ref 2.3–4.6)

## 2012-04-01 MED ORDER — LABETALOL HCL 200 MG PO TABS
400.0000 mg | ORAL_TABLET | Freq: Three times a day (TID) | ORAL | Status: DC
  Administered 2012-04-01 – 2012-04-05 (×13): 400 mg via ORAL
  Filled 2012-04-01 (×14): qty 2

## 2012-04-01 NOTE — Consult Note (Signed)
CARDIOLOGY CONSULT NOTE  Patient ID: [name] MRN: [id number], DOB/AGE: [birth year]   Admit date: 03/03/2012 Date of Consult: 04/01/2012   Primary Physician: William Hamburger, MD Primary Cardiologist: Einar Crow, MD  Pt. Profile  51 yr old female with h/o CAD s/p DES in RCA, resistant HTN and ICH who we have been asked to eval. for uncontrolled HTN.  Problem List  Past Medical History  Diagnosis Date  . HTN (hypertension)     resistant  . Hypercholesterolemia   . Tobacco abuse     resumed smoking half a pack a day. She was a smoker in the past and states she was abl eto stop in the past using nicotine patches  . CAD (coronary artery disease)     Pt has St elevation MI in Feb 2009. Left hear tcath at tha ttime showed a 70% distal LAD lesion that was hazy consistent with a plaque rupture. She had a 50% distal circumflex stenosis an a 95% distal RCA stenosis. She did havve drug eluting stent placed in her distal RCA  . Chronic kidney disease     most recent creatinine 1.4 in 3/10  . Intracranial hemorrhage, spontaneous intraparenchymal, idiopathic, remote, resolved   . Diastolic heart failure     pt most recent echo was in Feb 2009 w an EF of 60% and severe left ventricular hypertrophy. The pt did have evidence of diastolic dysfunction. The RV appeared normal  . Subarachnoid hemorrhage     03/2012    Past Surgical History  Procedure Date  . Ventriculoperitoneal shunt 03/27/2012    Procedure: SHUNT INSERTION VENTRICULAR-PERITONEAL;  Surgeon: Winfield Cunas, MD;  Location: [place] [facility];  Service: Neurosurgery;  Laterality: N/A;  Ventricular-Peritoneal Shunt Insertion    Allergies  Allergies  Allergen Reactions  . Ampicillin Other (See Comments)    unknown  . Penicillins Other (See Comments)    unknown   HPI   51 yr old with above medical problem list who was in her USOH until 5/15 when she presented with headache, n/v, and  SBP >300. Head CT 5/15 showed diffuse SAH with  high suspicion for aneurysm rupture.  Ventriculostomy drain was placed.  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  Hospital course has been complicated by VDRF, an episode of afib on 5/24, need for replacement of ventriculostomy x 2 secondary to pt pulling it out, and very difficult to control hypertension.  She is currently on amlodipine 10, losartan 25, labetalol 300 tid, clonidine 0.3 tid, hydralazine 100 q8, and spironolactone 25 daily.  Despite this regimen, pressures continue to run into the 170's.  Pt is currently extubated and sitting up in chair.  She is in no acute distress and has no complaints.  A renal artery duplex was performed yesterday with question of right renal artery stenosis (abnormal waveform per prelim report).   Inpatient Medications    . amLODipine  10 mg Oral Daily  . antiseptic oral rinse  15 mL Mouth Rinse QID  . cloNIDine  0.3 mg Oral Q8H  . hydrALAZINE  100 mg Oral Q8H  . labetalol  300 mg Oral TID  . levetiracetam  500 mg Intravenous Q12H  . losartan  25 mg Oral Daily  . pantoprazole sodium  40 mg Per Tube Q1200  . senna-docusate  1 tablet Oral BID  . spironolactone  25 mg Oral Daily  . DISCONTD: chlorhexidine  15 mL Mouth Rinse BID  Family History Family History  Problem Relation Age of Onset  . Coronary artery disease Other     premature in 1st degree relatives     Social History History   Social History  . Marital Status: Single    Spouse Name: N/A    Number of Children: N/A  . Years of Education: N/A   Occupational History  . Not on file.   Social History Main Topics  . Smoking status: Current Some Day Smoker  . Smokeless tobacco: Not on file   Comment: 1/2 ppd   . Alcohol Use: No  . Drug Use: No  . Sexually Active: Not on file   Other Topics Concern  . Not on file   Social History Narrative   Lives by herself but helps car for her 8 grandchildren.     Review of  Systems  Pt lethargic.  Difficult to obtain full ROS.  She admits to Headache on admission.  No chest pain, sob. All other systems reviewed and are otherwise negative except as noted above.  Physical Exam  Blood pressure 168/80, pulse 81, temperature 98.4 F (36.9 C), temperature source Oral, resp. rate 20, height 5\' 1"  (1.549 m), weight 160 lb 7.9 oz (72.8 kg), SpO2 98.00%.  General:  NAD Psych: flat affect. Neuro: Alert and oriented X 3. Moves all extremities spontaneously.  Weak bilat upper/lower ext. HEENT: dsg's d/i.  Neck: Supple. Skin   - arms dry w/ flaking of skin. Lungs:  Resp regular and unlabored, diminished breath sounds bilat.Marland Kitchen Heart: RRR no s3, s4, or murmurs. Abdomen: Soft, non-tender, non-distended, BS + x 4.  Extremities: No clubbing, cyanosis or edema.   Labs   Lab Results  Component Value Date   WBC 6.3 03/31/2012   HGB 10.2* 03/31/2012   HCT 31.6* 03/31/2012   MCV 88.5 03/31/2012   PLT 178 03/31/2012    Lab 04/01/12 0340  NA 141  K 4.2  CL 106  CO2 23  BUN 22  CREATININE 1.15*  CALCIUM 9.5  PROT --  BILITOT --  ALKPHOS --  ALT --  AST --  GLUCOSE 112*   Lab Results  Component Value Date   CHOL 167 04/18/2011   HDL 37.10* 04/18/2011   LDLCALC 107* 04/18/2011   TRIG 102 03/30/2012   2D Echocardiogram 03/06/2012  Study Conclusions  - Left ventricle: The cavity size was normal. Wall thickness   was increased in a pattern of moderate LVH. Systolic   function was normal. The estimated ejection fraction was   in the range of 55% to 60%. Wall motion was normal; there   were no regional wall motion abnormalities. Features are   consistent with a pseudonormal left ventricular filling   pattern, with concomitant abnormal relaxation and   increased filling pressure (grade 2 diastolic   dysfunction). Doppler parameters are consistent with   elevated mean left atrial filling pressure. - Left atrium: The atrium was mildly dilated.   Anterior-posterior  dimension: 56mm (2D). - Atrial septum: A patent foramen ovale cannot be excluded. - Pulmonary arteries: Systolic pressure could not be   accurately estimated. - Inferior vena cava: The vessel was dilated; the   respirophasic diameter changes were blunted (< 50%);   findings are consistent with elevated central venous   pressure. - Pericardium, extracardiac: A trivial pericardial effusion   was identified. _____________  Radiology/Studies  Ct Head Wo Contrast  04/01/2012  *RADIOLOGY REPORT*  Clinical Data: Follow-up aneurysm clipping  CT HEAD WITHOUT  CONTRAST  Technique:  Contiguous axial images were obtained from the base of the skull through the vertex without contrast.  Comparison: Multiple priors.  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  Ventricular catheter crosses the midline and exits the third ventricle, lying with its tip either in the left cerebral peduncle or interpeduncular notch.  Ventricular size is normal on today's study, significantly improved from 03/24/2012.  Small amount of fluid surrounds the ventricular catheter in its course through the right frontal cortex and subcortical white matter, decreased from priors.  No visible acute stroke, new subarachnoid hemorrhage, mass lesion, or significant extra-axial fluid. 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  Endovascular coil basilar tip grossly unchanged.  IMPRESSION: Satisfactory appearance status post ventriculoperitoneal shunting. Ventricular size now within normal limits.  Original Report Authenticated By: Staci Righter, M.D.  _____________  Cerebral Angiography 03/04/2012 S/P 4 vessel cerebral arteriogram RT CFA approach.  GA  Findings.  1 app67mmx 4..32mm Basilar apex aneurysm.  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  3.app3.68mm x 59mm lt ICA terminus aneurysm.  4.app 2.59mm x 2.78mm Lt ICA Pcom region  aneurysm.  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  Tele - RSR.  She's had a few runs of VT, the longest was a couplet followed by a sinus beat, followed by 8 beats NSVT, followed by a sinus beat, and then another couplet.  ASSESSMENT AND PLAN  1. SAH:  In setting of profound hypertension.  Nontraumatic.  Mgmt per NSU.  S/P ventriculostomy (replaced 6/8). Residual aneurysms awaiting coililng.  2. HTN- uncontrolled.  This has been an ongoing issue in the outpt setting as well.  Pt has a h/o missed appts and certainly we must question her compliance @ home.  Will titrate labetalol to 400 tid (max of 2400/day).  Need to watch creat closely in the setting of initiation of arb and spiro as she does have a h/o CKD with elevations in creat to 2.3 last summer in the setting of ACEI and diuretic (lasix).  Renal duplex preliminary suggests abnormality to R RA flow.  ? Stenosis.  Will ask Dr. Burt Knack to review.  If RAS present, at this point, would not be able to intervene 2/2 #1 and inability to use asa/plavix.  If not done, consider 24h urine for VMA, metanephrines, & catecholamines.  3.  HL - will check lipid panel.  Add statin if appropriate.  4. CKD II- will monitor creat/BUN.  See above re: arb/diuretic.  5. PAF:  Had AF on 5/24.  No further documented AF.  She would not be a candidate for anticoagulation.  Cont bb.  Signed, Murray Hodgkins, NP 04/01/2012, 8:48 AM  Patient seen with NP, agree with above note.  Patient has long history of resistant HTN and medical noncompliance.  She has known CAD (last myoview was normal).  Echo showed normal EF.  BP is improving on current regimen.  She was found to have possible renal artery stenosis.  - Agree with increasing labetalol - Agree with spironolactone, often successful with resistant HTN and renal artery stenosis.  - Cannot intervene on renal artery at this time given  recent SAH.  Will need followup of this in the future.   Loralie Champagne 04/01/2012 12:51 PM

## 2012-04-01 NOTE — Consult Note (Signed)
Physical Medicine and Rehabilitation Consult Reason for Consult: [facility] Referring Physician: Critical Care medicine   HPI: [name] is a 51 y.o. right-handed female with previous intraparenchymal hemorrhage in the past admitted 03/03/2012 with headache, nausea and vomiting. CT of the head showed diffuse subarachnoid hemorrhage in the basilar cisterns, sylvian fissures, along the tentorium and anterior falx. Followup neurosurgery Dr. Cyndy Freeze. Patient had been intubated and placement of ventriculostomy catheter. Patient was later extubated on 03/04/2012 Hospital course with obstructive hydrocephalus and ultimately underwent shunt insertion ventriculoperitoneal right side on 03/27/2012. Patient did again require intubation due to respiratory difficulties and again extubated 03/29/2012. Latest followup CT scan showed satisfactory appearance status post VP shunting. Patient is currently tolerating a dysphagia 3 thin liquid diet. Blood pressures have been difficult to manage with followup per cardiology services as well as critical care medicine. Plan to check renal artery duplex bilateral for any signs of renal artery stenosis. Physical therapy evaluation occupational therapy completed 03/30/2012 with recommendations for physical medicine rehabilitation consult to consider inpatient rehabilitation services  The patient reports a prior intracranial bleed in 1996 but she is able to resume independent functioning including driving. She states her sister can assist with her care although she reportedly works. Review of Systems  Gastrointestinal: Positive for nausea, vomiting and constipation.  Neurological: Positive for headaches.  All other systems reviewed and are negative.   Past Medical History  Diagnosis Date  . HTN (hypertension)     resistant  . Hypercholesterolemia   . Tobacco abuse     resumed smoking half a pack a day. She was a smoker in the past and states she was abl eto stop in the past  using nicotine patches  . CAD (coronary artery disease)     Pt has St elevation MI in Feb 2009. Left hear tcath at tha ttime showed a 70% distal LAD lesion that was hazy consistent with a plaque rupture. She had a 50% distal circumflex stenosis an a 95% distal RCA stenosis. She did havve drug eluting stent placed in her distal RCA  . Chronic kidney disease     most recent creatinine 1.4 in 3/10  . Intracranial hemorrhage, spontaneous intraparenchymal, idiopathic, remote, resolved   . Diastolic heart failure     pt most recent echo was in Feb 2009 w an EF of 60% and severe left ventricular hypertrophy. The pt did have evidence of diastolic dysfunction. The RV appeared normal  . Subarachnoid hemorrhage     03/2012   Past Surgical History  Procedure Date  . Ventriculoperitoneal shunt 03/27/2012    Procedure: SHUNT INSERTION VENTRICULAR-PERITONEAL;  Surgeon: Winfield Cunas, MD;  Location: [place] [facility];  Service: Neurosurgery;  Laterality: N/A;  Ventricular-Peritoneal Shunt Insertion   Family History  Problem Relation Age of Onset  . Coronary artery disease Other     premature in 1st degree relatives   Social History:  reports that she has been smoking.  She does not have any smokeless tobacco history on file. She reports that she does not drink alcohol or use illicit drugs. Allergies:  Allergies  Allergen Reactions  . Ampicillin Other (See Comments)    unknown  . Penicillins Other (See Comments)    unknown   Medications Prior to Admission  Medication Sig Dispense Refill  . amLODipine (NORVASC) 10 MG tablet Take 1 tablet (10 mg total) by mouth daily. This is for blood pressure  30 tablet  6  . aspirin EC 81 MG tablet Take 1 tablet (  81 mg total) by mouth daily. This is for your heart      . carvedilol (COREG) 12.5 MG tablet Take 1 tablet (12.5 mg total) by mouth 2 (two) times daily with a meal.  60 tablet  11  . hydrALAZINE (APRESOLINE) 50 MG tablet Take 1 tablet (50 mg total) by mouth 3  (three) times daily. This is for blood pressure  90 tablet  6  . nitroGLYCERIN (NITROSTAT) 0.4 MG SL tablet Place 0.4 mg under the tongue every 5 (five) minutes as needed. For chest pain        Home: Home Living Lives With: Daughter Available Help at Discharge: Family;Available 24 hours/day (per RN, family reports 24 hour assist is available) Type of Home: House Home Access: Stairs to enter [employer] of Steps: 4 Entrance Stairs-Rails: Can reach both Home Layout: One level Bathroom Shower/Tub: Tub/shower unit;Door Additional Comments: Family not available to confirm living environment  Functional History: Prior Function Able to Take Stairs?: Yes Driving: Yes Vocation: On disability Functional Status:  Mobility: Bed Mobility Bed Mobility: Supine to Sit;Sit to Supine Supine to Sit: 3: Mod assist;HOB elevated Supine to Sit: Patient Percentage: 20% Sit to Supine: 2: Max assist Sit to Supine: Patient Percentage: 10%        ADL: ADL Eating/Feeding: NPO Grooming: Performed;Wash/dry hands;Wash/dry face;Supervision/safety (apply lotion to hands) Where Assessed - Grooming: Supine, head of bed up Upper Body Bathing: Simulated;Moderate assistance Where Assessed - Upper Body Bathing: Supine, head of bed up Lower Body Bathing: Simulated;Maximal assistance Where Assessed - Lower Body Bathing: Supine, head of bed up;Rolling right and/or left Upper Body Dressing: Simulated;+1 Total assistance Where Assessed - Upper Body Dressing: Supported sitting Lower Body Dressing: Simulated;+1 Total assistance Where Assessed - Lower Body Dressing: Supine, head of bed up;Supine, head of bed flat;Rolling right and/or left ADL Comments: Pt. moved to EOB sitting with max A.  Pt is very slow to initate activity and movement.  Pt. maintains flexed posture in sitting, and with severe neck flexion seated.  Pt. unable to extend neck actively.  Attempted AAROM Rt. shoulder flexion, pt. performed 3  reps then stopped assisting due to being distracted by being cold.  Pt. performed 5 reps elbow flex/ext before becoming distracted and unable to re-direct.  Pt. sat EOB x 10 mins with min A.  Pt. returned to supine with max A.  Soft tissue mobs and gentle passive  and Active assisted stretch to increase neck extension and Lt. and rt. rotation  Cognition: Cognition Overall Cognitive Status: Impaired Arousal/Alertness: Lethargic Orientation Level: Oriented to person;Oriented to place Attention: Focused Focused Attention: Impaired Focused Attention Impairment: Verbal basic;Functional basic Memory: Impaired Memory Impairment: Storage deficit;Retrieval deficit Awareness: Impaired Awareness Impairment: Intellectual impairment Behaviors: Other (comment) (inert) Safety/Judgment: Impaired Cognition Overall Cognitive Status: Impaired Area of Impairment: Attention;Memory;Awareness of errors;Awareness of deficits;Problem solving Arousal/Alertness: Lethargic Orientation Level: Disoriented to;Place;Time;Situation Behavior During Session: Flat affect Current Attention Level: Sustained Memory Deficits: Poor recall of information presented during treatment session Awareness of Errors: Assistance required to identify errors made;Assistance required to correct errors made Awareness of Deficits: Impaired awareness Problem Solving: slow to process Cognition - Other Comments: Pt. disoriented to day, and time, but when given hints, she was able to deduce correct answers.  Not oriented to place, and unable to retain information 5 mins. later  Blood pressure 146/89, pulse 80, temperature 98.4 F (36.9 C), temperature source Oral, resp. rate 21, height 5\' 1"  (1.549 m), weight 72.8 kg (160 lb 7.9 oz),  SpO2 98.00%. Physical Exam  Neck: Neck supple. No thyromegaly present.  Cardiovascular: Regular rhythm.   Pulmonary/Chest:       Decreased breath sounds at the bases  Abdominal: She exhibits no distension.  There is no tenderness.  Neurological:       Patient is lethargic but makes good eye contact with examiner. She followed basic inconsistent one and two-step commands. She would name family members in the room  Skin:       VP shunt site dressed  motor strength 4/5 in bilateral hand grip 3 minus bilateral deltoid 4 minus bilateral biceps and triceps 3 minus bilateral hip flexors knee extensors and ankle dorsiflexors Sensation to light touch is intact Orientation is to person, "right around the corner from [place]", not to time, admitted that she had bleeding in her brain   Results for orders placed during the hospital encounter of 03/03/12 (from the past 24 hour(s))  RENAL FUNCTION PANEL     Status: Abnormal   Collection Time   03/31/12 10:44 PM      Component Value Range   Sodium 142  135 - 145 mEq/L   Potassium 4.5  3.5 - 5.1 mEq/L   Chloride 107  96 - 112 mEq/L   CO2 24  19 - 32 mEq/L   Glucose, Bld 122 (*) 70 - 99 mg/dL   BUN 23  6 - 23 mg/dL   Creatinine, Ser 1.19 (*) 0.50 - 1.10 mg/dL   Calcium 9.5  8.4 - 10.5 mg/dL   Phosphorus 4.8 (*) 2.3 - 4.6 mg/dL   Albumin 2.9 (*) 3.5 - 5.2 g/dL   GFR calc non Af Amer 52 (*) >90 mL/min   GFR calc Af Amer 61 (*) >90 mL/min  PHOSPHORUS     Status: Abnormal   Collection Time   04/01/12  3:40 AM      Component Value Range   Phosphorus 4.8 (*) 2.3 - 4.6 mg/dL  MAGNESIUM     Status: Normal   Collection Time   04/01/12  3:40 AM      Component Value Range   Magnesium 1.8  1.5 - 2.5 mg/dL  BASIC METABOLIC PANEL     Status: Abnormal   Collection Time   04/01/12  3:40 AM      Component Value Range   Sodium 141  135 - 145 mEq/L   Potassium 4.2  3.5 - 5.1 mEq/L   Chloride 106  96 - 112 mEq/L   CO2 23  19 - 32 mEq/L   Glucose, Bld 112 (*) 70 - 99 mg/dL   BUN 22  6 - 23 mg/dL   Creatinine, Ser 1.15 (*) 0.50 - 1.10 mg/dL   Calcium 9.5  8.4 - 10.5 mg/dL   GFR calc non Af Amer 55 (*) >90 mL/min   GFR calc Af Amer 63 (*) >90 mL/min   Ct  Head Wo Contrast  04/01/2012  *RADIOLOGY REPORT*  Clinical Data: Follow-up aneurysm clipping  CT HEAD WITHOUT CONTRAST  Technique:  Contiguous axial images were obtained from the base of the skull through the vertex without contrast.  Comparison: Multiple priors.  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  Ventricular catheter crosses the midline and exits the third ventricle, lying with its tip either in the left cerebral peduncle or interpeduncular notch.  Ventricular size is normal on today's study, significantly improved from 03/24/2012.  Small amount of fluid surrounds  the ventricular catheter in its course through the right frontal cortex and subcortical white matter, decreased from priors.  No visible acute stroke, new subarachnoid hemorrhage, mass lesion, or significant extra-axial fluid. 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  Endovascular coil basilar tip grossly unchanged.  IMPRESSION: Satisfactory appearance status post ventriculoperitoneal shunting. Ventricular size now within normal limits.  Original Report Authenticated By: Staci Righter, M.D.    Assessment/Plan: Diagnosis: subarachnoid hemorrhage with diffuse cognitive deficits, balance disorder, severe dysphagia, also deconditioned after one month hospital stay 1. Does the need for close, 24 hr/day medical supervision in concert with the patient's rehab needs make it unreasonable for this patient to be served in a less intensive setting? Yes 2. Co-Morbidities requiring supervision/potential complications: coronary artery disease, chronic kidney disease, protein calorie malnutrition 3. Due to bladder management, bowel management, safety, skin/wound care, disease management, medication administration and patient education, does the patient require 24 hr/day rehab nursing? Yes 4. Does the patient require coordinated care of a  physician, rehab nurse, PT (1-2 hrs/day, 5 days/week), OT (1-2 hrs/day, 5 days/week) and SLP (0.5-1 hrs/day, 5 days/week) to address physical and functional deficits in the context of the above medical diagnosis(es)? Yes Addressing deficits in the following areas: balance, endurance, locomotion, strength, transferring, bowel/bladder control, bathing, dressing, feeding, grooming, toileting, cognition, swallowing and psychosocial support 5. Can the patient actively participate in an intensive therapy program of at least 3 hrs of therapy per day at least 5 days per week? Yes 6. The potential for patient to make measurable gains while on inpatient rehab is good 7. Anticipated functional outcomes upon discharge from inpatient rehab are supervision mobility with PT, supervision ADLs with OT, safe by mouth swallowing with SLP. 8. Estimated rehab length of stay to reach the above functional goals is: 3-4 weeks 9. Does the patient have adequate social supports to accommodate these discharge functional goals? the patient reports a sister can help her at home however social work will need to confirm this 74. Anticipated D/C setting: Home 11. Anticipated post D/C treatments: [place] therapy 12. Overall Rehab/Functional Prognosis: good  RECOMMENDATIONS: This patient's condition is appropriate for continued rehabilitative care in the following setting: CIR Patient has agreed to participate in recommended program. Potentially Note that insurance prior authorization may be required for reimbursement for recommended care.  Comment:please assess caregiver ability to provide 24 7 supervision    04/01/2012

## 2012-04-01 NOTE — Progress Notes (Signed)
Nutrition Follow-up  Diet Order:  Dysphagia 3/Thin Per RN mental status appears to be a little improved today.  Pt has nasoenteric feeding tube in place with tip of tube within the proximal stomach near the GE junction. Jevity 1.2 is infusing @ 40 ml/hr. Providing 1152 kcal, 53 grams protein, and 777 ml H2O.  Residuals: 0 Last bm: documented 03/26/12   Meds: Scheduled Meds:   . amLODipine  10 mg Oral Daily  . antiseptic oral rinse  15 mL Mouth Rinse QID  . cloNIDine  0.3 mg Oral Q8H  . hydrALAZINE  100 mg Oral Q8H  . labetalol  400 mg Oral TID  . levetiracetam  500 mg Intravenous Q12H  . losartan  25 mg Oral Daily  . pantoprazole sodium  40 mg Per Tube Q1200  . senna-docusate  1 tablet Oral BID  . spironolactone  25 mg Oral Daily  . DISCONTD: chlorhexidine  15 mL Mouth Rinse BID  . DISCONTD: labetalol  300 mg Oral TID   Continuous Infusions:   . 0.9 % NaCl with KCl 20 mEq / L 20 mL/hr at 04/01/12 1400  . feeding supplement (JEVITY 1.2 CAL) 1,000 mL (04/01/12 1000)  . niCARDipine 5 mg/hr (03/31/12 1000)   PRN Meds:.acetaminophen, acetaminophen, acetaminophen-codeine, acetaminophen-codeine, bisacodyl, diazepam, fentaNYL, hydrALAZINE, labetalol, labetalol, nitroGLYCERIN, ondansetron (ZOFRAN) IV, ondansetron, promethazine  Labs:  CMP     Component Value Date/Time   NA 141 04/01/2012 0340   K 4.2 04/01/2012 0340   CL 106 04/01/2012 0340   CO2 23 04/01/2012 0340   GLUCOSE 112* 04/01/2012 0340   BUN 22 04/01/2012 0340   CREATININE 1.15* 04/01/2012 0340   CALCIUM 9.5 04/01/2012 0340   PROT 7.0 03/08/2012 0425   ALBUMIN 2.9* 03/31/2012 2244   AST 10 03/08/2012 0425   ALT <5 03/08/2012 0425   ALKPHOS 74 03/08/2012 0425   BILITOT 0.3 03/08/2012 0425   GFRNONAA 55* 04/01/2012 0340   GFRAA 63* 04/01/2012 0340  CBG (last 3)  No results found for this basename: GLUCAP:3 in the last 72 hours  Phosphorus  Date/Time Value Range Status  04/01/2012  3:40 AM 4.8* 2.3 - 4.6 mg/dL Final  03/31/2012  10:44 PM 4.8* 2.3 - 4.6 mg/dL Final  03/31/2012  3:40 AM 4.4  2.3 - 4.6 mg/dL Final   Magnesium  Date/Time Value Range Status  04/01/2012  3:40 AM 1.8  1.5 - 2.5 mg/dL Final  03/31/2012  3:40 AM 1.6  1.5 - 2.5 mg/dL Final  03/17/2012  3:30 AM 2.0  1.5 - 2.5 mg/dL Final    Intake/Output Summary (Last 24 hours) at 04/01/12 1622 Last data filed at 04/01/12 1448  Gross per 24 hour  Intake   1210 ml  Output   1285 ml  Net    -75 ml    Weight Status:  No new weight 160 lbs 6/5  Estimated needs: Kcal: 1700-1900; Protein: 80-90 grams; Fluid: >1.7 L/day   Nutrition Dx: Inadequate oral intake now r/t lethargy AEB intake of < 50% of her meals; ongoing   Goal: Provide >/= 90% of her estimated needs; not met.   Intervention:    Labs ok, advance TF to goal rate. Jevity 1.2 @ 60 ml/hr to provide: 1728 kcal, 80 grams protein, 1166 ml H2O.  Monitor:  TF tolerance, po intake  [name], [place] Pager #:  [phone]

## 2012-04-01 NOTE — Progress Notes (Signed)
Physical Therapy Treatment Patient Details Name: [name] MRN: [id number] DOB: 1960/10/21 Today's Date: 04/01/2012 Time: [id number] PT Time Calculation (min): 17 min  PT Assessment / Plan / Recommendation Comments on Treatment Session  Patient agreeable to attempt bed to chair and stated that she was glad to be up. Patient limited by fatique and decresed activity tolerance.     Follow Up Recommendations  Inpatient Rehab    Barriers to Discharge        Equipment Recommendations  Defer to next venue    Recommendations for Other Services Rehab consult  Frequency Min 3X/week   Plan Discharge plan remains appropriate;Frequency remains appropriate    Precautions / Restrictions Precautions Precautions: Fall   Pertinent Vitals/Pain     Mobility  Bed Mobility Supine to Sit: 1: +2 Total assist Supine to Sit: Patient Percentage: 40% Details for Bed Mobility Assistance: Cues and A to initiate movement and bring trunk up into sitting position Transfers Transfers: Stand Pivot Transfers Stand Pivot Transfers: 1: +2 Total assist Stand Pivot Transfers: Patient Percentage: 40% Details for Transfer Assistance: A with all aspects of transfer. Patient attempting to move LEs but unable at this time. Patient assisted slightly with hip movement    Exercises     PT Diagnosis:    PT Problem List:   PT Treatment Interventions:     PT Goals Acute Rehab PT Goals PT Goal: Supine/Side to Sit - Progress: Progressing toward goal PT Goal: Sit at Edge Of Bed - Progress: Progressing toward goal PT Transfer Goal: Bed to Chair/Chair to Bed - Progress: Progressing toward goal  Visit Information  Last PT Received On: 04/01/12 Assistance Needed: +2    Subjective Data  Subjective: "this better not take too much" reguarding energy   Cognition  Overall Cognitive Status: Impaired Area of Impairment: Memory;Attention Arousal/Alertness: Awake/alert Orientation Level: Disoriented  X4;Time;Situation;Place Behavior During Session: Flat affect Current Attention Level: Sustained    Balance     End of Session PT - End of Session Equipment Utilized During Treatment: Gait belt Activity Tolerance: Patient limited by fatigue Patient left: in chair Nurse Communication: Mobility status    Jacqualyn Posey 04/01/2012, 12:20 PM 04/01/2012 Jacqualyn Posey PTA [phone] pager [phone] office

## 2012-04-01 NOTE — Progress Notes (Signed)
Clinical Social Worker continuing to follow to assist with disposition planning. No family present at bedside at this time and per chart, pt alert and oriented to person only. Clinical Social Worker discussed with RN who reports that pt family is involved and visit daily approximately around lunch time. RN confirmed primary contact for pt is pt daughter, Warrick Parisian. Clinical Social Worker contacted pt daughter via telephone. Pt daughter confirmed that discussion of disposition planning had been started with her and pt daughter was aware of inpatient rehab referral. Pt daughter stated that she is able to live with pt and able to provide pt 24 hour care at discharge if necessary. Clinical Social Worker discussed with pt daughter secondary options if inpatient rehab unable to accept including SNF placement and home health services. Pt daughter stated that she does not want pt placed in a SNF at discharge. Pt daughter supportive and eager to ensure pt disposition plan is what pt would want. Clinical Social Worker to continue to follow and provide support to pt and pt family and attempt to meet pt daughter when pt daughter at [place], MSW, [place] Work [phone]

## 2012-04-01 NOTE — Evaluation (Signed)
Clinical/Bedside Swallow Evaluation and cognitive treatment Patient Details  Name: [name] MRN: [id number] Date of Birth: October 08, 1961  Today's Date: 04/01/2012 Time:  10:45-11:15 swallow; 11:15-11:27 cognition    Past Medical History:  Past Medical History  Diagnosis Date  . HTN (hypertension)     resistant  . Hypercholesterolemia   . Tobacco abuse     resumed smoking half a pack a day. She was a smoker in the past and states she was abl eto stop in the past using nicotine patches  . CAD (coronary artery disease)     Pt has St elevation MI in Feb 2009. Left hear tcath at tha ttime showed a 70% distal LAD lesion that was hazy consistent with a plaque rupture. She had a 50% distal circumflex stenosis an a 95% distal RCA stenosis. She did havve drug eluting stent placed in her distal RCA  . Chronic kidney disease     most recent creatinine 1.4 in 3/10  . Intracranial hemorrhage, spontaneous intraparenchymal, idiopathic, remote, resolved   . Diastolic heart failure     pt most recent echo was in Feb 2009 w an EF of 60% and severe left ventricular hypertrophy. The pt did have evidence of diastolic dysfunction. The RV appeared normal  . Subarachnoid hemorrhage     03/2012   Past Surgical History:  Past Surgical History  Procedure Date  . Ventriculoperitoneal shunt 03/27/2012    Procedure: SHUNT INSERTION VENTRICULAR-PERITONEAL;  Surgeon: Winfield Cunas, MD;  Location: [place] [facility];  Service: Neurosurgery;  Laterality: N/A;  Ventricular-Peritoneal Shunt Insertion   HPI:  51 y.o. 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 ETT 5/15-5/16.   Followed briefly for swallowing.  6/8 - patient pulled out ventric.  Shunt placed by neurosurgery.  Extubated 6/10.   NPO with Panda tube feedings (pt with safe pharyngeal swallow at time of d/c from SLP - but with poor  intake, poor initiation.)  Swallow eval reordered today.    Assessment / Plan / Recommendation Clinical Impression  Pt's swallowing remains functional, consistent with prior performance.  Oropharyngeal function WNL - barriers to adequate PO intake continue to be related to mental status.  REC beginning PO diet again (mechanical soft, thin liquids), allow straws.  Discussed with RN - will maintain Panda feedings supplementing PO intake for now.  No further f/u for swallowing is indicated.         Diet Recommendation Dysphagia 3 (Mechanical Soft);Thin liquid   Liquid Administration via: Cup;Straw Medication Administration: Whole meds with liquid Supervision: Full supervision/cueing for compensatory strategies Compensations: Check for pocketing;Small sips/bites Postural Changes and/or Swallow Maneuvers: Seated upright 90 degrees       Cognition:  SLP Goal #1: Pt will focus and sustain attention to functional task in nondistracting environment for 2 minutes with mod assist  .  SLP Goal #1 - Progress: Continues to require max assist; impersistance noted with tasks  SLP Goal #2: Pt will demonstrate improved verbal/physical response time in <15 seconds with mod assist.  Progress:  With max verbal/tactile cues, pt initiating self-feeding and responding to questions with greater consistency.  Beginning to act on environment/request needs, asking for a drink, a blanket. SLP Goal #3: Pt will utilize environmental cues to facilitate orientation to surroundings/time with mod assist. Progress: persisting disorientation to place/time/situation "I'm on a ship", "2001".  Max cues to search environment for  answers to orientation questions.   Camaron Cammack L. Tivis Ringer, [place] CCC/SLP Pager [phone]   [name] 04/01/2012,11:24 AM

## 2012-04-01 NOTE — Progress Notes (Signed)
Subjective: Patient reports awake, alert, oriented  Objective: Vital signs in last 24 hours: Temp:  [98.1 F (36.7 C)-98.6 F (37 C)] 98.4 F (36.9 C) (06/13 0730) Pulse Rate:  [71-86] 81  (06/13 0730) Resp:  [18-26] 20  (06/13 0730) BP: (127-189)/(55-90) 168/80 mmHg (06/13 0730) SpO2:  [96 %-99 %] 98 % (06/13 0730)  Intake/Output from previous day: 06/12 0701 - 06/13 0700 In: 1170 [I.V.:440; NG/GT:730] Out: 1575 [Urine:1575] Intake/Output this shift:    Physical Exam: [place] CDI.  Lab Results:  Basename 03/31/12 0340 03/30/12 0416  WBC 6.3 7.6  HGB 10.2* 10.7*  HCT 31.6* 33.8*  PLT 178 195   BMET  Basename 04/01/12 0340 03/31/12 2244  NA 141 142  K 4.2 4.5  CL 106 107  CO2 23 24  GLUCOSE 112* 122*  BUN 22 23  CREATININE 1.15* 1.19*  CALCIUM 9.5 9.5    Studies/Results: Ct Head Wo Contrast  04/01/2012  *RADIOLOGY REPORT*  Clinical Data: Follow-up aneurysm clipping  CT HEAD WITHOUT CONTRAST  Technique:  Contiguous axial images were obtained from the base of the skull through the vertex without contrast.  Comparison: Multiple priors.  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  Ventricular catheter crosses the midline and exits the third ventricle, lying with its tip either in the left cerebral peduncle or interpeduncular notch.  Ventricular size is normal on today's study, significantly improved from 03/24/2012.  Small amount of fluid surrounds the ventricular catheter in its course through the right frontal cortex and subcortical white matter, decreased from priors.  No visible acute stroke, new subarachnoid hemorrhage, mass lesion, or significant extra-axial fluid. 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  Endovascular coil basilar tip grossly unchanged.  IMPRESSION: Satisfactory appearance status post ventriculoperitoneal shunting. Ventricular size now within  normal limits.  Original Report Authenticated By: Staci Righter, M.D.    Assessment/Plan: Mobilize, PT. Continue CCM.    LOS: 29 days    Krisy Dix D, MD 04/01/2012, 9:16 AM

## 2012-04-01 NOTE — Progress Notes (Signed)
Name: [name] MRN: [id number] DOB: 1961-01-12    LOS: 29  Referring Provider: Dr. Cyndy Freeze, neurosurgery Reason for Referral:  Reconsult for vent management  PULMONARY / CRITICAL CARE MEDICINE  Brief patient description: 51 yo with hx of HTN and previous ICH who presented to [facility] ED on 5/15 with severe hypertension and acute SAH.  Events Since Admission:  5/15 Presented to [facility] ED hypertensive with nausea, vomiting, headache and was found to have [place]. Intubated. Ventriculostomy placed.  5/16 Extubated  5/17 Some hypoxia, off nicardipine drip now  5/18 Variable O2 needs, pcxr edema, tachy, trop neg  5/19 Pulled out ventriculostomy catheter, episodes of tachycardia  5/20 Briefly required Cardene, ventricular drain replaced  5/28 Back on nicardipine  6/1 ventric lowered  6/8 Patient pulled out ventric.  Rt V-P Shunt placed by neurosurgery. 6/10 Extubated again 03/31/12 Renal Artery U/S>> abnormal waveform in Right renal artery noted  Interval history:  Patient without major issues since CCM singed off on 03/23/12.  6/8 became agitated and pulled ventriculostomy, so VP shunt placed on 6/8 and she returned to 3100 intubated.  Extubated 6/10  Current Status:  Much more awake and alert this morning.  Nicardipine off  Vital Signs: Temp:  [98.1 F (36.7 C)-98.8 F (37.1 C)] 98.3 F (36.8 C) (06/13 0400) Pulse Rate:  [71-86] 81  (06/13 0730) Resp:  [18-27] 20  (06/13 0730) BP: (127-189)/(55-90) 168/80 mmHg (06/13 0730) SpO2:  [96 %-99 %] 98 % (06/13 0730)  Physical Examination:  Gen: more awake and alert, speech clear HEENT: Scalp surgical wound well dressed, EOMi, Panda in place PULM: CTA B CV: RRR, systolic murmur noted, no JVD AB: BS+, soft, nontender, no hsm Ext: warm, trace edema, no clubbing, no cyanosis, scd's Derm: no rash or skin breakdown Neuro: more alert, speech slow but clear   Principal Problem:  *SAH (subarachnoid hemorrhage) Active Problems:  CAD, NATIVE  VESSEL  DIASTOLIC HEART FAILURE, CHRONIC  CHRONIC KIDNEY DISEASE UNSPECIFIED  Acute respiratory failure  Acidosis  Severe hypertension   ASSESSMENT AND PLAN  PULMONARY Ventilator Settings:   CXR:    ETT:   5/15 >>>5/16 6/8 >>> 6/10  CXR 6/8 good ETT position  A:  Extubated 6/10 after VP shunt, aspiration risk due to mental status P:   -  Extubated 6/10 -  Aspiration precautions -  Speech eval pending   CARDIOVASCULAR TTE: 5/18 >>> Grade 2 diastolic disease, IVC dilated , EF 55%  A:  Hypertensive emergency - resolved Recurrent severe hypertensive, still elevated 6/12 History of CAD, CHF (diastolic).  SVT / sinus tachycardia 6/12 Renal artery U/S >> Abnormal R renal artery waveform  P:  - Cont clonidine 0.3 mg PO q8h  -Cont Labetalol 300 mg PO q8h, with holding parameters for SBP of 150 or HR of 60.  - Hydralazine 100 mg PO q8h  - Amlodipine 10mg  po daily - Spironolactone po 25 mg daily - Losartan po 25 mg daily  - PRN IV hydralazine, prn nitropaste - Cardiology consult today >> blood pressure management and possible renal artery stenosis? - f/u aldosterone level, urinary VMA - Goal SBP < 180   RENAL  Lab 04/01/12 0340 03/31/12 2244 03/31/12 0340 03/30/12 0416 03/29/12 0415  NA 141 142 141 142 142  K 4.2 4.5 -- -- --  CL 106 107 107 110 111  CO2 23 24 22 21 19   BUN 22 23 19 18 21   CREATININE 1.15* 1.19* 1.12* 1.00 1.21*  CALCIUM 9.5 9.5 9.5  9.5 9.5  MG 1.8 -- 1.6 -- --  PHOS 4.8* 4.8* 4.4 -- --   Intake/Output      06/12 0701 - 06/13 0700 06/13 0701 - 06/14 0700   I.V. (mL/kg) 440 (6)    NG/GT 730    IV Piggyback     Total Intake(mL/kg) 1170 (16.1)    Urine (mL/kg/hr) 1575 (0.9)    Total Output 1575    Net -405          Foley: 5/25 >>>  A: Acute on chronic renal failure, improving.  Renal artery stenosis NAG acidosis - improved  P:  - Follow BMET   GASTROINTESTINAL  A:  Protein - calorie malnutrition P:   Enteral tube feeds Speech  eval  HEMATOLOGIC  Lab 03/31/12 0340 03/30/12 0416 03/29/12 0415 03/27/12 2210  HGB 10.2* 10.7* 10.4* 11.4*  HCT 31.6* 33.8* 32.1* 34.7*  PLT 178 195 199 233  INR -- -- -- --  APTT -- -- -- --   A: No active issues P:  DVTp - SCDs  INFECTIOUS  Lab 03/31/12 0340 03/30/12 0416 03/29/12 0415 03/27/12 2210  WBC 6.3 7.6 8.9 8.0  PROCALCITON -- -- -- --   Cultures:  NA  Antibiotics:  Ancef 5/15 (per Neurosurgery) >>> 5/27  Vanc (per NS) 5/27 >> stopped Moxifloxacin (per NS) 5/27 >> 6/3 Ancef 6/8 >> 6/9 (2 doses, peri-operative)  A: No definite evidence of infection. P:   - monitor for fever, signs of infection  ENDOCRINE No results found for this basename: GLUCAP:5 in the last 168 hours A: Hyperglycemia, resolved  P:  - No intervention required  NEUROLOGIC Head CT:  5/16 >>> Diffuse SAH with ventriculomegaly  5/20 >>> Normal size ventricles  5/21 >>> Residual intraventricular hemorrhage, redevelopment of hydrocephalus  5/26 >> Moderate ventricular enlargement despite apparent satisfactory placement of intraventricular drainage catheter consistent with post subarachnoid hemorrhage hydrocephalus  6/1 >>Stable ventriculomegaly. Right CVP placement appears stable. Decreased small volume of intraventricular hemorrhage. No new intracranial hemorrhage. Continued diffuse cerebral edema. Stable basilar cisterns  6/4 >> Stable 6/13 >> satisfactory appearance s/p VP shunting  Angio: 5/16 >>> Aneurysm coiled, 3 remain  TCD: 5/20 >>> Mild vasospasm, elevated ICP  5/27>>>Normal mean flow velocities in majority of identified vessels without evidence of vasospasm.   A: SAH, nontraumatic, s/p ventriculostomy (replaced 5/21). Hydrocephalus. Multiple aneurisms, 1 of 4 coiled.  Shunt placed 6/8. P:  - Shunt per neurosx - minimize sedating meds    BEST PRACTICE / DISPOSITION Level of Care:  ICU Primary Service:  Neurosurg Consultants:  CCM Code Status:  Full Diet:  NPO DVT Px:   SCDs GI Px:  Protonix Skin Integrity:  No issues Social / Family:  Family no available  Simonne Maffucci, M.D. Pulmonary and [place] Pager: [phone]   04/01/2012, 7:48 AM

## 2012-04-02 ENCOUNTER — Inpatient Hospital Stay (HOSPITAL_COMMUNITY): Payer: No Typology Code available for payment source

## 2012-04-02 LAB — MAGNESIUM: Magnesium: 1.6 mg/dL (ref 1.5–2.5)

## 2012-04-02 LAB — BASIC METABOLIC PANEL
Calcium: 9.4 mg/dL (ref 8.4–10.5)
Creatinine, Ser: 1.07 mg/dL (ref 0.50–1.10)
GFR calc Af Amer: 69 mL/min — ABNORMAL LOW (ref >90)

## 2012-04-02 LAB — PHOSPHORUS: Phosphorus: 4.1 mg/dL (ref 2.3–4.6)

## 2012-04-02 NOTE — Progress Notes (Signed)
Rehab admissions - Evaluated for possible admission.  I spoke with patient today and then called her daughter.  Dtr does not want NHP.  Dtr currently not working and plans to provide care and supervision for patient after rehab stay.  Will follow progress over weekend and can potentially admit to inpatient rehab on Monday if MD feels patient is medically stable.  Will follow up on Monday am.  Call me for questions.  [phone]

## 2012-04-02 NOTE — Progress Notes (Signed)
Physical Therapy Treatment Patient Details Name: [name] MRN: [id number] DOB: 08/13/1961 Today's Date: 04/02/2012 Time: [id number] PT Time Calculation (min): 29 min  PT Assessment / Plan / Recommendation Comments on Treatment Session  Pt extremely fatigued prior to session although agreed to work with therapy. Sat at EOB for majority of session working on trunk and neck extension due to extreme tightness. Pt complained of pain during stretching but stated that it was tolerable. Pt refused to atttempt stand this session. Will continue per plan    Follow Up Recommendations  Inpatient Rehab       Equipment Recommendations  Defer to next venue    Recommendations for Other Services Rehab consult  Frequency Min 3X/week   Plan Discharge plan remains appropriate;Frequency remains appropriate    Precautions / Restrictions Precautions Precautions: Fall Restrictions Weight Bearing Restrictions: No       Mobility  Bed Mobility Bed Mobility: Supine to Sit;Sit to Supine;Sitting - Scoot to Edge of Bed Supine to Sit: 1: +2 Total assist Supine to Sit: Patient Percentage: 40% Sitting - Scoot to Edge of Bed: 2: Max assist Sit to Supine: 1: +2 Total assist;HOB flat Sit to Supine: Patient Percentage: 20% Details for Bed Mobility Assistance: Cues and A to initiate movement and bring trunk up into sitting position    Exercises Other Exercises Other Exercises: Pt. moved to EOB with max A.  Pt. continues with flexion of trunk and neck flexion.  Thoracic and lumbar mobilization performed with facilitation at pelvis, rib cage, and head.  Able to hold upright position for 5-10 second periods.  Pt. sat EOB x 13 mins, before fatiguing.  Pt. returned to supine with total A +2 (pt. ~20%).  Pt. positioned on towel roll along the length of his spine with a folded towel under head to facilitate trunk extension and neck extension.  Asked RN to check and/or remove towel roll in 30-60 mins.        PT  Goals Acute Rehab PT Goals PT Goal: Supine/Side to Sit - Progress: Progressing toward goal PT Goal: Sit at [facility] Of Bed - Progress: Progressing toward goal PT Goal: Sit to Supine/Side - Progress: Progressing toward goal  Visit Information  Last PT Received On: 04/02/12 Assistance Needed: +2 PT/OT Co-Evaluation/Treatment: Yes    Subjective Data      Cognition  Overall Cognitive Status: Impaired Area of Impairment: Memory;Attention Arousal/Alertness: Awake/alert Orientation Level: Time;Situation;Place;Disoriented to Behavior During Session: Flat affect Current Attention Level: Sustained Memory Deficits: decreased recall of information Awareness of Errors: Assistance required to identify errors made;Assistance required to correct errors made Awareness of Deficits: Impaired awareness Problem Solving: slow to process    Balance  Balance Balance Assessed: Yes  End of Session PT - End of Session Activity Tolerance: Patient limited by fatigue Patient left: in bed;with call bell/phone within reach;with bed alarm set Nurse Communication: Mobility status    Ambrose [place] 04/02/2012, 5:18 PM

## 2012-04-02 NOTE — PMR Pre-admission (Signed)
PMR Admission Coordinator Pre-Admission Assessment  Patient: [name] is an 51 y.o., female MRN: [id number] DOB: 20-Aug-1961 Height: 5\' 1"  (154.9 cm) Weight: 73.4 kg (161 lb 13.1 oz)  Insurance Information HMO: yes   PPO:       PCP:       IPA:       80/20:       OTHER: Group # [id number] PRIMARY: [employer]      Policy#: [id number]      Subscriber: [name] CM Name:        Phone#:       Fax#: [phone] Pre-Cert#:        Employer: Disabled/Unemployed Benefits:  Phone #: [phone]     Name: Jearld Pies. Date: 10/21/11     Deduct: $0      Out of Pocket Max: $6700      Life Max: unlimited CIR: $235 days 1-6      SNF: $0 1-20 days, $135 days 21-100 Outpatient: Cap $1900/yr.     Co-Pay: $45/visit Home Health: 100%      Co-Pay: none DME: 80%     Co-Pay: 20% Providers: in network  SECONDARY: Medicaid      Policy#: [id number]      Subscriber: [name] CM Name:        Phone#:       Fax#:   Pre-Cert#:        Employer: Disabled/Unemployed Benefits:  Phone #: [phone]     Name:   Eff. Date: 04/02/12     Deduct:        Out of Pocket Max:        Life Max:   CIR:        SNF:   Outpatient:       Co-Pay:   Home Health:        Co-Pay:   DME:       Co-Pay:    Emergency Contact Information Contact Information    Name Relation Home Work Mobile   Miller Daughter   [phone]   Ladene Artist Daughter   [phone]   Bubba Hales   [phone]     Current Medical History  Patient Admitting Diagnosis:  SAH  History of Present Illness:  A 51 y.o. right-handed female with previous intraparenchymal hemorrhage in the past admitted 03/03/2012 with headache, nausea and vomiting. CT of the head showed diffuse subarachnoid hemorrhage in the basilar cisterns, sylvian fissures, along the tentorium and anterior falx. Followup neurosurgery Dr. Cyndy Freeze. Patient had been intubated and placement of ventriculostomy catheter. Patient was later extubated on  03/04/2012 Hospital course with obstructive hydrocephalus and ultimately underwent shunt insertion ventriculoperitoneal right side on 03/27/2012. Patient did again require intubation due to respiratory difficulties and again extubated 03/29/2012. Latest followup CT scan showed satisfactory appearance status post VP shunting. Patient is currently tolerating a dysphagia 3 thin liquid diet. Blood pressures have been difficult to manage with followup per cardiology services as well as critical care medicine. Plan to check renal artery duplex bilateral for any signs of renal artery stenosis.  Total: 1   Past Medical History  Past Medical History  Diagnosis Date  . HTN (hypertension)     resistant  . Hypercholesterolemia   . Tobacco abuse     resumed smoking half a pack a day. She was a smoker in the past and states she was abl eto stop in the past using nicotine patches  . CAD (coronary artery disease)     Pt  has St elevation MI in Feb 2009. Left hear tcath at tha ttime showed a 70% distal LAD lesion that was hazy consistent with a plaque rupture. She had a 50% distal circumflex stenosis an a 95% distal RCA stenosis. She did havve drug eluting stent placed in her distal RCA  . Chronic kidney disease     most recent creatinine 1.4 in 3/10  . Intracranial hemorrhage, spontaneous intraparenchymal, idiopathic, remote, resolved   . Diastolic heart failure     pt most recent echo was in Feb 2009 w an EF of 60% and severe left ventricular hypertrophy. The pt did have evidence of diastolic dysfunction. The RV appeared normal  . Subarachnoid hemorrhage     03/2012    Family History  family history includes Coronary artery disease in her other.  Prior Rehab/Hospitalizations: Had aneurysm 17 yrs ago and was in the hospital for 2 months.  Current Medications  Current facility-administered medications:0.45 % sodium chloride infusion, , Intravenous, Continuous, Wilhelmina Mcardle, MD, Last Rate: 75 mL/hr at  04/05/12 0034;  acetaminophen (TYLENOL) suppository 650 mg, 650 mg, Rectal, Q4H PRN, Winfield Cunas, MD, 650 mg at 03/14/12 1831;  acetaminophen (TYLENOL) tablet 650 mg, 650 mg, Oral, Q4H PRN, Winfield Cunas, MD, 650 mg at 04/03/12 1358 acetaminophen-codeine (TYLENOL #3) 300-30 MG per tablet 1-2 tablet, 1-2 tablet, Oral, Q4H PRN, Winfield Cunas, MD, 2 tablet at 03/30/12 1555;  acetaminophen-codeine 120-12 MG/5ML solution 10 mL, 10 mL, Oral, Q4H PRN, Winfield Cunas, MD;  amLODipine (NORVASC) tablet 10 mg, 10 mg, Oral, Daily, Juanito Doom, MD, 10 mg at 04/05/12 1003 antiseptic oral rinse (BIOTENE) solution 15 mL, 15 mL, Mouth Rinse, QID, Rigoberto Noel, MD, 15 mL at 04/05/12 0000;  bisacodyl (DULCOLAX) EC tablet 5 mg, 5 mg, Oral, Daily PRN, Winfield Cunas, MD, 5 mg at 03/27/12 2123;  cloNIDine (CATAPRES) tablet 0.3 mg, 0.3 mg, Oral, Q8H, Konstantin Zubelevitskiy, MD, 0.3 mg at 04/05/12 1003;  diazepam (VALIUM) tablet 5 mg, 5 mg, Oral, Q6H PRN, Winfield Cunas, MD, 5 mg at 03/31/12 2128 fentaNYL (SUBLIMAZE) injection 25 mcg, 25 mcg, Intravenous, Q2H PRN, Juanito Doom, MD, 25 mcg at 04/03/12 2257;  hydrALAZINE (APRESOLINE) injection 10-40 mg, 10-40 mg, Intravenous, Q4H PRN, Wilhelmina Mcardle, MD, 20 mg at 04/04/12 [id number];  hydrALAZINE (APRESOLINE) tablet 100 mg, 100 mg, Oral, Q8H, Juanito Doom, MD, 100 mg at 04/05/12 [id number];  labetalol (NORMODYNE) tablet 400 mg, 400 mg, Oral, TID, Rogelia Mire, NP, 400 mg at 04/05/12 1002 labetalol (NORMODYNE,TRANDATE) injection 10 mg, 10 mg, Intravenous, Q4H PRN, Chesley Mires, MD, 10 mg at 04/01/12 0734;  labetalol (NORMODYNE,TRANDATE) injection 10-40 mg, 10-40 mg, Intravenous, Q10 min PRN, Winfield Cunas, MD, 20 mg at 04/02/12 0327;  levETIRAcetam (KEPPRA) 500 mg in sodium chloride 0.9 % 100 mL IVPB, 500 mg, Intravenous, Q12H, Winfield Cunas, MD, 500 mg at 04/05/12 [id number] losartan (COZAAR) tablet 25 mg, 25 mg, Oral, BID, Minus Breeding, MD, 25 mg at 04/05/12 1002;   nitroGLYCERIN (NITROGLYN) 2 % ointment 1 inch, 1 inch, Topical, Q6H PRN, Juanito Doom, MD, 1 inch at 04/02/12 0414;  ondansetron (ZOFRAN) injection 4 mg, 4 mg, Intravenous, Q4H PRN, Winfield Cunas, MD;  ondansetron (ZOFRAN) tablet 4 mg, 4 mg, Oral, Q4H PRN, Winfield Cunas, MD promethazine (PHENERGAN) tablet 12.5-25 mg, 12.5-25 mg, Oral, Q4H PRN, Winfield Cunas, MD;  senna-docusate (Senokot-S) tablet 1 tablet, 1 tablet, Oral, BID, Winfield Cunas, MD, 1  tablet at 04/05/12 1003;  spironolactone (ALDACTONE) tablet 25 mg, 25 mg, Oral, Daily, Juanito Doom, MD, 25 mg at 04/05/12 1003 DISCONTD: feeding supplement (JEVITY 1.2 CAL) liquid 1,000 mL, 1,000 mL, Per Tube, Continuous, Heather Cornelison Pitts, RD, Last Rate: 60 mL/hr at 04/02/12 1557, 1,000 mL at 04/02/12 1557;  DISCONTD: niCARdipine (CARDENE-IV) infusion (0.1 mg/ml), 5 mg/hr, Intravenous, Continuous, Wilhelmina Mcardle, MD, Last Rate: 50 mL/hr at 03/31/12 1000, 5 mg/hr at 03/31/12 1000 DISCONTD: pantoprazole sodium (PROTONIX) 40 mg/20 mL oral suspension 40 mg, 40 mg, Per Tube, Q1200, Winfield Cunas, MD, 40 mg at 04/04/12 1251  Patients Current Diet: Dysphagia  Precautions / Restrictions Precautions Precautions: Fall Restrictions Weight Bearing Restrictions: No   Prior Activity Level Limited Community (1-2x/wk): Goes out 3 X a week.   Home Assistive Devices / Equipment Home Assistive Devices/Equipment: None  Prior Functional Level Prior Function Level of Independence: Independent Able to Take Stairs?: Yes Driving: Yes Vocation: On disability  Current Functional Level Cognition  Arousal/Alertness: Awake/alert Overall Cognitive Status: Impaired Overall Cognitive Status: Impaired Current Attention Level: Sustained Memory Deficits: decreased recall of information Orientation Level: Oriented to person;Oriented to place Awareness of Errors: Assistance required to identify errors made;Assistance required to correct errors  made Awareness of Deficits: Impaired awareness Cognition - Other Comments: Pt. disoriented to day, and time, but when given hints, she was able to deduce correct answers.  Not oriented to place, and unable to retain information 5 mins. later Attention: Focused Focused Attention: Impaired Focused Attention Impairment: Verbal basic;Functional basic Memory: Impaired Memory Impairment: Storage deficit;Retrieval deficit Awareness: Impaired Awareness Impairment: Intellectual impairment Behaviors: Other (comment) (inert) Safety/Judgment: Impaired    Extremity Assessment (includes Sensation/Coordination)  RUE ROM/Strength/Tone: Deficits (tremulous) RUE ROM/Strength/Tone Deficits: AROM 80-90* shoulder elevation:  shoulder strength 2+/5; elbow 3+/5; hand 3-/5 RUE Coordination: Deficits RUE Coordination Deficits: due to weakness  RLE ROM/Strength/Tone: Deficits;Unable to fully assess;Due to impaired cognition RLE ROM/Strength/Tone Deficits: 3/5 gross RLE Sensation:  (unable to fully assess)    ADLs  Eating/Feeding: NPO Grooming: Performed;Wash/dry hands;Wash/dry face;Supervision/safety (apply lotion to hands) Where Assessed - Grooming: Supine, head of bed up Upper Body Bathing: Simulated;Moderate assistance Where Assessed - Upper Body Bathing: Supine, head of bed up Lower Body Bathing: Simulated;Maximal assistance Where Assessed - Lower Body Bathing: Supine, head of bed up;Rolling right and/or left Upper Body Dressing: Simulated;+1 Total assistance Where Assessed - Upper Body Dressing: Supported sitting Lower Body Dressing: Simulated;+1 Total assistance Where Assessed - Lower Body Dressing: Supine, head of bed up;Supine, head of bed flat;Rolling right and/or left ADL Comments: Pt. reluctantly agreed to OT due to fatigue.  Pt. moved to EOB with max A.  Pt. continues with flexion of trunk and neck flexion.  Thoracic and lumbar mobilization performed with facilitation at pelvis, rib cage, and  head.  Able to hold upright position for 5-10 second periods.  Pt. sat EOB x 13 mins, before fatiguing.  Pt. returned to supine with total A +2 (pt. ~20%).  Pt. positioned on towel roll along the length of his spine with a folded towel under head to facilitate trunk extension and neck extension.  Asked RN to check and/or remove towel roll in 30-60 mins.    Mobility  Bed Mobility: Supine to Sit;Sitting - Scoot to Edge of Bed Supine to Sit: 1: +2 Total assist Supine to Sit: Patient Percentage: 40% Sitting - Scoot to Edge of Bed: 1: +2 Total assist Sitting - Scoot to Edge of Bed: Patient Percentage: 30% Sit  to Supine: 1: +2 Total assist;HOB flat Sit to Supine: Patient Percentage: 20%    Transfers  Transfers: Sit to Stand;Stand to Sit;Stand Pivot Transfers Sit to Stand: 1: +2 Total assist;With upper extremity assist;From elevated surface;From bed Sit to Stand: Patient Percentage: 40% Stand to Sit: 1: +2 Total assist;With upper extremity assist;With armrests;To chair/3-in-1 Stand to Sit: Patient Percentage: 40% Stand Pivot Transfers: 1: +2 Total assist Stand Pivot Transfers: Patient Percentage: 40%    Ambulation / Gait / Stairs / Wheelchair Mobility  Ambulation/Gait Ambulation/Gait Assistance: Not tested (comment) Stairs: No Wheelchair Mobility Wheelchair Mobility: No    Posture / Chiropodist Sitting - Balance Support: Bilateral upper extremity supported;Feet supported Static Sitting - Level of Assistance: 4: Min assist Static Sitting - Comment/# of Minutes: pt requires consistent MinA to maintain balance and increased A needed when attempting trunk/cervical extension     Previous Home Environment Living Arrangements: Alone (Pt states family is going to help out) Lives With: Daughter Available Help at Discharge: Family;Available 24 hours/day (per RN, family reports 24 hour assist is available) Type of Home: House Home Layout: One level Home Access: Stairs  to enter Entrance Stairs-Rails: Can reach both Entrance Stairs-Number of Steps: 4 Bathroom Shower/Tub: Tub/shower unit;Door Home Care Services: No Additional Comments: Family not available to confirm living environment  Discharge Living Setting Plans for Discharge Living Setting: Patient's home;House (Dtr lives with patient.) Type of Home at Discharge: House Discharge Home Layout: One level;Able to live on main level with bedroom/bathroom Discharge Home Access: Stairs to enter Entrance Stairs-Number of Steps: 3 steps at back and 2 steps at front entrance. Do you have any problems obtaining your medications?: No  Social/Family/Support Systems Patient Roles: Parent Contact Information: Warrick Parisian - Dtr, Ladene Artist - Dt Anticipated Caregiver: [name] - Dtr Anticipated Caregiver's Contact Information: Karen ChafeAlcario Drought [phone]; Verlene Mayer - sister [phone] Ability/Limitations of Caregiver: Dtr currently not working and can assist. Caregiver Availability: 24/7 Discharge Plan Discussed with Primary Caregiver: Yes Is Caregiver In Agreement with Plan?: Yes Does Caregiver/Family have Issues with Lodging/Transportation while Pt is in Rehab?: No  Goals/Additional Needs Patient/Family Goal for Rehab: PT/OT/ST S goals Expected length of stay: 3-4 weeks Cultural Considerations: None Dietary Needs: Dysphagia 3, thin liquids Equipment Needs: TBD Pt/Family Agrees to Admission and willing to participate: Yes Program Orientation Provided & Reviewed with Pt/Caregiver Including Roles  & Responsibilities: Yes  Patient Condition: Please see physician update to information in consult dated 04/01/12.  Preadmission Screen Completed By:  Retta Diones, 04/05/2012 11:11 AM ______________________________________________________________________   Discussed status with Dr. Naaman Plummer on 04/05/12 a [id number] and received telephone approval for admission today.  Admission  Coordinator:  Retta Diones, time1112/Date06/17/13

## 2012-04-02 NOTE — Progress Notes (Addendum)
Name: [name] MRN: [id number] DOB: October 14, 1961    LOS: 27  Referring Provider: Dr. Cyndy Freeze, neurosurgery Reason for Referral:  Reconsult for vent management  PULMONARY / CRITICAL CARE MEDICINE  Brief patient description: 51 yo with hx of HTN and previous ICH who presented to [facility] ED on 5/15 with severe hypertension and acute SAH.  Events Since Admission:  5/15 Presented to [facility] ED hypertensive with nausea, vomiting, headache and was found to have [place]. Intubated. Ventriculostomy placed.  5/16 Extubated  5/17 Some hypoxia, off nicardipine drip now  5/18 Variable O2 needs, pcxr edema, tachy, trop neg  5/19 Pulled out ventriculostomy catheter, episodes of tachycardia  5/20 Briefly required Cardene, ventricular drain replaced  5/28 Back on nicardipine  6/1 ventric lowered  6/8 Patient pulled out ventric.  Rt V-P Shunt placed by neurosurgery. 6/10 Extubated again 03/31/12 Renal Artery U/S>> abnormal waveform in Right renal artery noted  Interval history:  Patient without major issues since CCM singed off on 03/23/12.  6/8 became agitated and pulled ventriculostomy, so VP shunt placed on 6/8 and she returned to 3100 intubated.  Extubated 6/10  Current Status:  Awake and alert, eating.  Nicardipine off 48 hours  Vital Signs: Temp:  [97.6 F (36.4 C)-98.6 F (37 C)] 97.9 F (36.6 C) (06/14 0700) Pulse Rate:  [70-86] 76  (06/14 0800) Resp:  [2-24] 16  (06/14 0800) BP: (118-188)/(64-119) 170/70 mmHg (06/14 0800) SpO2:  [97 %-99 %] 98 % (06/14 0800) Weight:  [73.2 kg (161 lb 6 oz)] 73.2 kg (161 lb 6 oz) (06/14 0400)  Physical Examination:  Gen: awake and alert, speech clear HEENT: Scalp surgical wound well dressed, EOMi, Panda in place PULM: CTA B CV: RRR, systolic murmur noted, no JVD AB: BS+, soft, nontender, no hsm Ext: warm, RUE edema, no clubbing, no cyanosis, scd's Derm: no rash or skin breakdown Neuro: awake and alert, speech clear, generally weak all over   Principal  Problem:  *SAH (subarachnoid hemorrhage) Active Problems:  CAD, NATIVE VESSEL  DIASTOLIC HEART FAILURE, CHRONIC  CHRONIC KIDNEY DISEASE UNSPECIFIED  Acute respiratory failure  Acidosis  Severe hypertension  Protein calorie malnutrition   ASSESSMENT AND PLAN  PULMONARY Ventilator Settings:   CXR:    ETT:   5/15 >>>5/16 6/8 >>> 6/10  CXR 6/8 good ETT position  A:  Extubated 6/10 after VP shunt,  6/13 able to take po P:   -  Monitor O2 sat   CARDIOVASCULAR TTE: 5/18 >>> Grade 2 diastolic disease, IVC dilated , EF 55%  A:  Hypertensive emergency - resolved Recurrent severe hypertensive, still elevated 6/12 History of CAD, CHF (diastolic).  SVT / sinus tachycardia 6/12 Renal artery U/S >> Abnormal R renal artery waveform >> per cardiology no intervention possible with SAH  P:  - Cont clonidine 0.3 mg PO q8h  -Cont Labetalol 300 mg PO q8h, with holding parameters for SBP of 150 or HR of 60.  - Hydralazine 100 mg PO q8h  - Amlodipine 10mg  po daily - Spironolactone po 25 mg daily - Losartan po 25 mg daily  - PRN IV hydralazine, prn nitropaste - f/u aldosterone level, urinary VMA - Goal SBP < 180 - Appreciate cardiology   RENAL  Lab 04/02/12 0325 04/01/12 0340 03/31/12 2244 03/31/12 0340 03/30/12 0416  NA 139 141 142 141 142  K 4.3 4.2 -- -- --  CL 104 106 107 107 110  CO2 23 23 24 22 21   BUN 22 22 23 19  18  CREATININE 1.07 1.15* 1.19* 1.12* 1.00  CALCIUM 9.4 9.5 9.5 9.5 9.5  MG 1.6 1.8 -- 1.6 --  PHOS 4.1 4.8* 4.8* 4.4 --   Intake/Output      06/13 0701 - 06/14 0700 06/14 0701 - 06/15 0700   P.O. 210 120   I.V. (mL/kg) 480 (6.6) 20 (0.3)   NG/GT 810 60   IV Piggyback 420    Total Intake(mL/kg) 1920 (26.2) 200 (2.7)   Urine (mL/kg/hr) 1170 (0.7) 725   Total Output 1170 725   Net +750 -525         Foley: 5/25 >>>  A: Acute on chronic renal failure, improving.  Renal artery stenosis NAG acidosis - improved  P:  - Follow BMET daily with BP  meds  GASTROINTESTINAL  A:  Protein - calorie malnutrition P:   Continue enteral tube feeds while po intake still low Advance diet  HEMATOLOGIC  Lab 03/31/12 0340 03/30/12 0416 03/29/12 0415 03/27/12 2210  HGB 10.2* 10.7* 10.4* 11.4*  HCT 31.6* 33.8* 32.1* 34.7*  PLT 178 195 199 233  INR -- -- -- --  APTT -- -- -- --   A: No active issues P:  DVTp - SCDs  INFECTIOUS  Lab 03/31/12 0340 03/30/12 0416 03/29/12 0415 03/27/12 2210  WBC 6.3 7.6 8.9 8.0  PROCALCITON -- -- -- --   Cultures:  NA  Antibiotics:  Ancef 5/15 (per Neurosurgery) >>> 5/27  Vanc (per NS) 5/27 >> stopped Moxifloxacin (per NS) 5/27 >> 6/3 Ancef 6/8 >> 6/9 (2 doses, peri-operative)  A: No definite evidence of infection. P:   - monitor for fever, signs of infection  ENDOCRINE No results found for this basename: GLUCAP:5 in the last 168 hours A: Hyperglycemia, resolved  P:  - No intervention required  NEUROLOGIC Head CT:  5/16 >>> Diffuse SAH with ventriculomegaly  5/20 >>> Normal size ventricles  5/21 >>> Residual intraventricular hemorrhage, redevelopment of hydrocephalus  5/26 >> Moderate ventricular enlargement despite apparent satisfactory placement of intraventricular drainage catheter consistent with post subarachnoid hemorrhage hydrocephalus  6/1 >>Stable ventriculomegaly. Right CVP placement appears stable. Decreased small volume of intraventricular hemorrhage. No new intracranial hemorrhage. Continued diffuse cerebral edema. Stable basilar cisterns  6/4 >> Stable 6/13 >> satisfactory appearance s/p VP shunting  Angio: 5/16 >>> Aneurysm coiled, 3 remain  TCD: 5/20 >>> Mild vasospasm, elevated ICP  5/27>>>Normal mean flow velocities in majority of identified vessels without evidence of vasospasm.   A: SAH, nontraumatic, s/p ventriculostomy (replaced 5/21). Hydrocephalus. Multiple aneurisms, 1 of 4 coiled.  Shunt placed 6/8. P:  - Shunt per neurosx - minimize sedating meds  Patient  appropriate for SDU level of care.  PCCM will see as needed over weekend.   BEST PRACTICE / DISPOSITION Level of Care:  ICU >> SDU Primary Service:  Neurosurg Consultants:  CCM Code Status:  Full Diet:  NPO DVT Px:  SCDs GI Px:  Protonix Skin Integrity:  No issues Social / Family:  Family no available  Simonne Maffucci, M.D. Pulmonary and [place] Pager: [phone]   04/02/2012, 9:27 AM

## 2012-04-02 NOTE — Progress Notes (Signed)
Subjective:  The patient denies any chest pain or dyspnea. BP improving on current meds.  Objective:  Vital Signs in the last 24 hours: Temp:  [97.6 F (36.4 C)-98.6 F (37 C)] 97.6 F (36.4 C) (06/14 0350) Pulse Rate:  [70-86] 78  (06/14 0700) Resp:  [2-24] 16  (06/14 0700) BP: (118-188)/(64-119) 139/72 mmHg (06/14 0700) SpO2:  [97 %-99 %] 98 % (06/14 0700) Weight:  [73.2 kg (161 lb 6 oz)] 73.2 kg (161 lb 6 oz) (06/14 0400)  Intake/Output from previous day: 06/13 0701 - 06/14 0700 In: 1920 [P.O.:210; I.V.:480; NG/GT:810; IV Piggyback:420] Out: 1170 [Urine:1170] Intake/Output from this shift:       . amLODipine  10 mg Oral Daily  . antiseptic oral rinse  15 mL Mouth Rinse QID  . cloNIDine  0.3 mg Oral Q8H  . hydrALAZINE  100 mg Oral Q8H  . labetalol  400 mg Oral TID  . levetiracetam  500 mg Intravenous Q12H  . losartan  25 mg Oral Daily  . pantoprazole sodium  40 mg Per Tube Q1200  . senna-docusate  1 tablet Oral BID  . spironolactone  25 mg Oral Daily  . DISCONTD: labetalol  300 mg Oral TID      . 0.9 % NaCl with KCl 20 mEq / L 20 mL/hr at 04/01/12 2314  . feeding supplement (JEVITY 1.2 CAL) 1,000 mL (04/01/12 1000)  . niCARDipine 5 mg/hr (03/31/12 1000)    Physical Exam: The patient appears to be in no distress.  Head and neck exam reveals that the pupils are equal and reactive.  The extraocular movements are full.  There is no scleral icterus.  Mouth and pharynx are benign.  No lymphadenopathy.  No carotid bruits.  The jugular venous pressure is normal.  Thyroid is not enlarged or tender. Dressings in place.  Chest is clear to percussion and auscultation.  No rales or rhonchi.  Expansion of the chest is symmetrical.  Heart reveals no abnormal lift or heave.  First and second heart sounds are normal.  There is no murmur gallop rub or click.  The abdomen is soft and nontender.  Bowel sounds are normoactive.  There is no hepatosplenomegaly or mass.  There are  no abdominal bruits.  Extremities reveal no phlebitis or edema.  Pedal pulses are good.  There is no cyanosis or clubbing.  Neurologic exam is normal strength and no lateralizing weakness.  No sensory deficits.  Integument reveals no rash  Lab Results:  Basename 03/31/12 0340  WBC 6.3  HGB 10.2*  PLT 178    Basename 04/02/12 0325 04/01/12 0340  NA 139 141  K 4.3 4.2  CL 104 106  CO2 23 23  GLUCOSE 103* 112*  BUN 22 22  CREATININE 1.07 1.15*   No results found for this basename: TROPONINI:2,CK,MB:2 in the last 72 hours Hepatic Function Panel  Basename 03/31/12 2244  PROT --  ALBUMIN 2.9*  AST --  ALT --  ALKPHOS --  BILITOT --  BILIDIR --  IBILI --   No results found for this basename: CHOL in the last 72 hours No results found for this basename: PROTIME in the last 72 hours  Imaging: Ct Head Wo Contrast  04/01/2012  *RADIOLOGY REPORT*  Clinical Data: Follow-up aneurysm clipping  CT HEAD WITHOUT CONTRAST  Technique:  Contiguous axial images were obtained from the base of the skull through the vertex without contrast.  Comparison: Multiple priors.  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  Ventricular catheter crosses the midline and exits the third ventricle, lying with its tip either in the left cerebral peduncle or interpeduncular notch.  Ventricular size is normal on today's study, significantly improved from 03/24/2012.  Small amount of fluid surrounds the ventricular catheter in its course through the right frontal cortex and subcortical white matter, decreased from priors.  No visible acute stroke, new subarachnoid hemorrhage, mass lesion, or significant extra-axial fluid. 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  Endovascular coil basilar tip grossly unchanged.  IMPRESSION: Satisfactory appearance status post ventriculoperitoneal shunting. Ventricular size  now within normal limits.  Original Report Authenticated By: Staci Righter, M.D.   Dg Abd Portable 1v  04/02/2012  *RADIOLOGY REPORT*  Clinical Data: Feeding tube placement  PORTABLE ABDOMEN - 1 VIEW  Comparison: 03/29/2012  Findings: Feeding tube tip projects over the distal stomach. Nonobstructive bowel gas pattern.  Hemidiaphragms are excluded from the image.  No acute osseous finding.  IMPRESSION: Feeding tube tip projects over the distal stomach.  Original Report Authenticated By: Suanne Marker, M.D.    Cardiac Studies: Tele shows NSR Assessment/Plan:  Chronic severe hypertension currently responding to multidrug regimen. Subarachnoid hemorrhage secondary to uncontrolled hypertension  Plan: Potassium stable on current meds.  Continue to monitor BMET closely   LOS: 30 days    [name] 04/02/2012, 7:40 AM

## 2012-04-02 NOTE — Progress Notes (Signed)
Occupational Therapy Treatment Patient Details Name: [name] MRN: [id number] DOB: January 03, 1961 Today's Date: 04/02/2012 Time: [id number] OT Time Calculation (min): 29 min  OT Assessment / Plan / Recommendation Comments on Treatment Session Pt. fatigued today.   demonstratated improved trunk mobility and extension with facillitation    Follow Up Recommendations  Inpatient Rehab;Supervision/Assistance - 24 hour    Barriers to Discharge       Equipment Recommendations       Recommendations for Other Services Rehab consult  Frequency     Plan Discharge plan remains appropriate    Precautions / Restrictions Precautions Precautions: Fall Restrictions Weight Bearing Restrictions: No   Pertinent Vitals/Pain     ADL  ADL Comments: Pt. reluctantly agreed to OT due to fatigue.  Pt. moved to EOB with max A.  Pt. continues with flexion of trunk and neck flexion.  Thoracic and lumbar mobilization performed with facilitation at pelvis, rib cage, and head.  Able to hold upright position for 5-10 second periods.  Pt. sat EOB x 13 mins, before fatiguing.  Pt. returned to supine with total A +2 (pt. ~20%).  Pt. positioned on towel roll along the length of his spine with a folded towel under head to facilitate trunk extension and neck extension.  Asked RN to check and/or remove towel roll in 30-60 mins.    OT Diagnosis:    OT Problem List:   OT Treatment Interventions:     OT Goals ADL Goals ADL Goal: Grooming - Progress: Progressing toward goals  Visit Information  Last OT Received On: 04/02/12 Assistance Needed: +2 PT/OT Co-Evaluation/Treatment: Yes    Subjective Data      Prior Functioning       Cognition  Overall Cognitive Status: Impaired Area of Impairment: Memory;Attention Arousal/Alertness: Awake/alert Orientation Level: Time;Situation;Place;Disoriented to Behavior During Session: Flat affect Current Attention Level: Sustained Memory Deficits: decreased recall of  information Awareness of Errors: Assistance required to identify errors made;Assistance required to correct errors made Awareness of Deficits: Impaired awareness Problem Solving: slow to process    Mobility Bed Mobility Bed Mobility: Supine to Sit;Sit to Supine;Sitting - Scoot to Edge of Bed Supine to Sit: 1: +2 Total assist Supine to Sit: Patient Percentage: 40% Sitting - Scoot to Edge of Bed: 2: Max assist Sit to Supine: 1: +2 Total assist;HOB flat Sit to Supine: Patient Percentage: 20% Details for Bed Mobility Assistance: Cues and A to initiate movement and bring trunk up into sitting position   Exercises    Balance    End of Session OT - End of Session Activity Tolerance: Patient limited by fatigue Patient left: in bed;with call bell/phone within reach Nurse Communication:  (to remove towel roll)   [name] M 04/02/2012, 5:14 PM

## 2012-04-02 NOTE — Progress Notes (Signed)
Pt found at 23:15 on 04/01/12 to have removed one hand mitt and pulled out nasogastric feeding tube.  [place] nasogastric feeding tube placed at 00:45 on 04/02/12, and KUB x-ray ordered.

## 2012-04-02 NOTE — Progress Notes (Signed)
At approximately 2045 pt removed safety mitts on both hands and removed her left nare panda tube. Pt VVS, pt was calm and was in no respiratory distress. Dr Saintclair Halsted was notified and order to replace the tube and obtain a stat portable abd xray received. Tube was placed and placement verified via xray and ascultation, and tube feeding started. Mitts were replaced. Will continue to monitor.  Meribeth Mattes, RN

## 2012-04-02 NOTE — Progress Notes (Signed)
Pt admitted to 3304, VS stable, and has bilateral safety mittens.

## 2012-04-02 NOTE — Progress Notes (Signed)
Subjective: Patient alert, states name upon request, makes eye contact.  Objective: Vital signs in last 24 hours: Temp:  [97.6 F (36.4 C)-98.6 F (37 C)] 97.9 F (36.6 C) (06/14 0700) Pulse Rate:  [68-86] 80  (06/14 1016) Resp:  [2-24] 21  (06/14 1000) BP: (118-188)/(64-119) 140/72 mmHg (06/14 1016) SpO2:  [97 %-99 %] 98 % (06/14 1000) Weight:  [73.2 kg (161 lb 6 oz)] 73.2 kg (161 lb 6 oz) (06/14 0400)  Intake/Output from previous day: 06/13 0701 - 06/14 0700 In: 1920 [P.O.:210; I.V.:480; NG/GT:810; IV Piggyback:420] Out: 1170 [Urine:1170] Intake/Output this shift: Total I/O In: 460 [P.O.:120; I.V.:40; NG/GT:300] Out: 725 [Urine:725]  Drsg intact, dry. Pt awake, responds slowly, but improving.   Lab Results:  [facility] 03/31/12 0340  WBC 6.3  HGB 10.2*  HCT 31.6*  PLT 178   BMET  Basename 04/02/12 0325 04/01/12 0340  NA 139 141  K 4.3 4.2  CL 104 106  CO2 23 23  GLUCOSE 103* 112*  BUN 22 22  CREATININE 1.07 1.15*  CALCIUM 9.4 9.5    Studies/Results: Ct Head Wo Contrast  04/01/2012  *RADIOLOGY REPORT*  Clinical Data: Follow-up aneurysm clipping  CT HEAD WITHOUT CONTRAST  Technique:  Contiguous axial images were obtained from the base of the skull through the vertex without contrast.  Comparison: Multiple priors.  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  Ventricular catheter crosses the midline and exits the third ventricle, lying with its tip either in the left cerebral peduncle or interpeduncular notch.  Ventricular size is normal on today's study, significantly improved from 03/24/2012.  Small amount of fluid surrounds the ventricular catheter in its course through the right frontal cortex and subcortical white matter, decreased from priors.  No visible acute stroke, new subarachnoid hemorrhage, mass lesion, or significant extra-axial fluid. Remote aneurysm clips right  ICA and right MCA vessels.  Endovascular coil basilar tip grossly unchanged.  IMPRESSION: Satisfactory appearance status post ventriculoperitoneal shunting. Ventricular size now within normal limits.  Original Report Authenticated By: Staci Righter, M.D.   Dg Abd Portable 1v  04/02/2012  *RADIOLOGY REPORT*  Clinical Data: Feeding tube placement  PORTABLE ABDOMEN - 1 VIEW  Comparison: 03/29/2012  Findings: Feeding tube tip projects over the distal stomach. Nonobstructive bowel gas pattern.  Hemidiaphragms are excluded from the image.  No acute osseous finding.  IMPRESSION: Feeding tube tip projects over the distal stomach.  Original Report Authenticated By: Suanne Marker, M.D.    Assessment/Plan: Improving  LOS: 30 days  Per Dr. Vertell Limber, ok to transfer to 3300. Hopeful of CIR placement.   Verdis Prime 04/02/2012, 10:33 AM    As above

## 2012-04-03 DIAGNOSIS — R609 Edema, unspecified: Secondary | ICD-10-CM

## 2012-04-03 LAB — BASIC METABOLIC PANEL
Calcium: 9.3 mg/dL (ref 8.4–10.5)
GFR calc non Af Amer: 56 mL/min — ABNORMAL LOW (ref >90)
Glucose, Bld: 142 mg/dL — ABNORMAL HIGH (ref 70–99)
Sodium: 138 mEq/L (ref 135–145)

## 2012-04-03 MED ORDER — LOSARTAN POTASSIUM 25 MG PO TABS
25.0000 mg | ORAL_TABLET | Freq: Two times a day (BID) | ORAL | Status: DC
  Administered 2012-04-03 – 2012-04-05 (×4): 25 mg via ORAL
  Filled 2012-04-03 (×5): qty 1

## 2012-04-03 NOTE — Progress Notes (Signed)
B/P 181/60 HR 88 Hydralazine 10 mg IV administered as ordered.

## 2012-04-03 NOTE — Progress Notes (Signed)
Subjective: Patient reports She's doing fine no complaints  Objective: Vital signs in last 24 hours: Temp:  [98.8 F (37.1 C)-99.1 F (37.3 C)] 99 F (37.2 C) (06/15 0742) Pulse Rate:  [68-99] 96  (06/15 0740) Resp:  [20-28] 27  (06/15 0740) BP: (92-190)/(47-137) 164/134 mmHg (06/15 0740) SpO2:  [98 %-99 %] 99 % (06/15 0740) Weight:  [76.1 kg (167 lb 12.3 oz)-76.9 kg (169 lb 8.5 oz)] 76.9 kg (169 lb 8.5 oz) (06/15 0500)  Intake/Output from previous day: 06/14 0701 - 06/15 0700 In: 2905 [P.O.:720; I.V.:440; NG/GT:1640; IV Piggyback:105] Out: 1875 [Urine:1875] Intake/Output this shift: Total I/O In: 80 [I.V.:20; NG/GT:60] Out: 225 [Urine:225]  Awake alert follows commands x4  Lab Results: No results found for this basename: WBC:2,HGB:2,HCT:2,PLT:2 in the last 72 hours BMET  [facility] 04/03/12 0452 04/02/12 0325  NA 138 139  K 5.0 4.3  CL 103 104  CO2 24 23  GLUCOSE 142* 103*  BUN 20 22  CREATININE 1.13* 1.07  CALCIUM 9.3 9.4    Studies/Results: Dg Abd Portable 1v  04/02/2012  *RADIOLOGY REPORT*  Clinical Data: Feeding tube reinsertion.  PORTABLE ABDOMEN - 1 VIEW  Comparison: Chest x-ray 04/02/2012.  Findings: Tip of feeding tube is now in the body of the stomach.  A ventriculoperitoneal shunt tubing is seen projecting over the right upper quadrant of the abdomen.  IMPRESSION: 1.  Tip of feeding tube is now in the body of the stomach.  Original Report Authenticated By: Etheleen Mayhew, M.D.   Dg Abd Portable 1v  04/02/2012  *RADIOLOGY REPORT*  Clinical Data: Feeding tube placement  PORTABLE ABDOMEN - 1 VIEW  Comparison: 03/29/2012  Findings: Feeding tube tip projects over the distal stomach. Nonobstructive bowel gas pattern.  Hemidiaphragms are excluded from the image.  No acute osseous finding.  IMPRESSION: Feeding tube tip projects over the distal stomach.  Original Report Authenticated By: Suanne Marker, M.D.    Assessment/Plan: Continue therapies feeding tube  replaced workup placement next week  LOS: 31 days     Trevyn Lumpkin P 04/03/2012, 8:32 AM

## 2012-04-03 NOTE — Progress Notes (Signed)
   SUBJECTIVE:  Doesn't feel well.  No chest pain or SOB.  Positive headache   PHYSICAL EXAM Filed Vitals:   04/03/12 0648 04/03/12 0740 04/03/12 0742 04/03/12 1139  BP: 190/137 164/134    Pulse:  96    Temp:  99 F (37.2 C) 99 F (37.2 C) 98.8 F (37.1 C)  TempSrc:  Oral Oral Oral  Resp:  27    Height:      Weight:      SpO2:  99%     General:  No distress Lungs:  Clear Heart:  RRR Abdomen:  Positive bowel sounds, no rebound no guarding Extremities:  No edema  LABS: Lab Results  Component Value Date   CKTOTAL 226* 03/08/2012   CKMB 1.3 03/08/2012   TROPONINI <0.30 03/08/2012   Results for orders placed during the hospital encounter of 03/03/12 (from the past 24 hour(s))  BASIC METABOLIC PANEL     Status: Abnormal   Collection Time   04/03/12  4:52 AM      Component Value Range   Sodium 138  135 - 145 mEq/L   Potassium 5.0  3.5 - 5.1 mEq/L   Chloride 103  96 - 112 mEq/L   CO2 24  19 - 32 mEq/L   Glucose, Bld 142 (*) 70 - 99 mg/dL   BUN 20  6 - 23 mg/dL   Creatinine, Ser 1.13 (*) 0.50 - 1.10 mg/dL   Calcium 9.3  8.4 - 10.5 mg/dL   GFR calc non Af Amer 56 (*) >90 mL/min   GFR calc Af Amer 65 (*) >90 mL/min    Intake/Output Summary (Last 24 hours) at 04/03/12 1156 Last data filed at 04/03/12 1138  Gross per 24 hour  Intake   2425 ml  Output   1775 ml  Net    650 ml    ASSESSMENT AND PLAN:  HTN:  BPs are labile but predominantly elevated.  Meds have been changed significantly compared with home medications.  I will change Cozaar to bid.  CAD:  No active ischemia.  Continue with medical management.  Diastolic HF:  She seems to be euvolemic.  Continue current therapy.    Jeneen Rinks [facility] 04/03/2012 11:56 AM

## 2012-04-03 NOTE — Progress Notes (Signed)
VASCULAR LAB PRELIMINARY  PRELIMINARY  PRELIMINARY  PRELIMINARY  Right upper extremity venous Doppler completed.    Preliminary report:  No DVT noted in the right upper extremity.  There is partially thrombosed SVT noted in the right cephalic vein at the [facility].  Sharion Dove [place], 04/03/2012, 3:35 PM  Gave report to Shon Millet., RN.

## 2012-04-04 LAB — BASIC METABOLIC PANEL
BUN: 21 mg/dL (ref 6–23)
Calcium: 9.7 mg/dL (ref 8.4–10.5)
GFR calc Af Amer: 67 mL/min — ABNORMAL LOW (ref >90)
GFR calc non Af Amer: 58 mL/min — ABNORMAL LOW (ref >90)
Potassium: 4.9 mEq/L (ref 3.5–5.1)
Sodium: 136 mEq/L (ref 135–145)

## 2012-04-04 MED ORDER — SODIUM CHLORIDE 0.45 % IV SOLN
INTRAVENOUS | Status: DC
  Administered 2012-04-04: 11:00:00 via INTRAVENOUS
  Administered 2012-04-05: 20 mL/h via INTRAVENOUS
  Administered 2012-04-05: 01:00:00 via INTRAVENOUS

## 2012-04-04 MED ORDER — POTASSIUM CHLORIDE IN NACL 20-0.45 MEQ/L-% IV SOLN: INTRAVENOUS | Status: DC

## 2012-04-04 NOTE — Progress Notes (Signed)
Subjective: Patient reports That she's feeling okay she continues to bother PEG tube she reports that she does not want replaced so we'll put her on IV fluids in the meantime  Objective: Vital signs in last 24 hours: Temp:  [98.2 F (36.8 C)-98.8 F (37.1 C)] 98.5 F (36.9 C) (06/16 0802) Pulse Rate:  [80-88] 80  (06/16 0802) Resp:  [18-30] 19  (06/16 0802) BP: (145-188)/(60-108) 145/79 mmHg (06/16 0802) SpO2:  [89 %-100 %] 100 % (06/16 0802) Weight:  [74.9 kg (165 lb 2 oz)] 74.9 kg (165 lb 2 oz) (06/16 0601)  Intake/Output from previous day: 06/15 0701 - 06/16 0700 In: 1645 [P.O.:180; I.V.:40; NG/GT:1110; IV Piggyback:315] Out: 3055 [Urine:3055] Intake/Output this shift: Total I/O In: -  Out: 250 [Urine:250]  Awake alert follows commands localizes  Lab Results: No results found for this basename: WBC:2,HGB:2,HCT:2,PLT:2 in the last 72 hours BMET  [facility] 04/04/12 0725 04/03/12 0452  NA 136 138  K 4.9 5.0  CL 100 103  CO2 24 24  GLUCOSE 103* 142*  BUN 21 20  CREATININE 1.09 1.13*  CALCIUM 9.7 9.3    Studies/Results: Dg Abd Portable 1v  04/02/2012  *RADIOLOGY REPORT*  Clinical Data: Feeding tube reinsertion.  PORTABLE ABDOMEN - 1 VIEW  Comparison: Chest x-ray 04/02/2012.  Findings: Tip of feeding tube is now in the body of the stomach.  A ventriculoperitoneal shunt tubing is seen projecting over the right upper quadrant of the abdomen.  IMPRESSION: 1.  Tip of feeding tube is now in the body of the stomach.  Original Report Authenticated By: Etheleen Mayhew, M.D.    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  LOS: 32 days     Jacqueline Spofford P 04/04/2012, 9:35 AM

## 2012-04-04 NOTE — Progress Notes (Signed)
Although  Pt  Had mittens on she was able to pull out her [employer] Tube.

## 2012-04-04 NOTE — Progress Notes (Signed)
We have made a request to the  Charge to provide a Air cabin crew for this patient.We will reinsert another [name] as soon as we receive on  from pharmacy.

## 2012-04-04 NOTE — Progress Notes (Signed)
   SUBJECTIVE:  Today she denies any headache or other pain.  No SOB.     PHYSICAL EXAM Filed Vitals:   04/04/12 0134 04/04/12 0300 04/04/12 0601 04/04/12 0802  BP: 151/79 170/96 188/96 145/79  Pulse: 85 81 84 80  Temp:  98.3 F (36.8 C)  98.5 F (36.9 C)  TempSrc:  Oral  Oral  Resp: 26 21 18 19   Height:      Weight:   165 lb 2 oz (74.9 kg)   SpO2: 99% 94% 100% 100%   General:  No distress Lungs:  Clear Heart:  RRR Abdomen:  Positive bowel sounds, no rebound no guarding Extremities:  No edema  LABS: Lab Results  Component Value Date   CKTOTAL 226* 03/08/2012   CKMB 1.3 03/08/2012   TROPONINI <0.30 03/08/2012   Results for orders placed during the hospital encounter of 03/03/12 (from the past 24 hour(s))  BASIC METABOLIC PANEL     Status: Abnormal   Collection Time   04/04/12  7:25 AM      Component Value Range   Sodium 136  135 - 145 mEq/L   Potassium 4.9  3.5 - 5.1 mEq/L   Chloride 100  96 - 112 mEq/L   CO2 24  19 - 32 mEq/L   Glucose, Bld 103 (*) 70 - 99 mg/dL   BUN 21  6 - 23 mg/dL   Creatinine, Ser 1.09  0.50 - 1.10 mg/dL   Calcium 9.7  8.4 - 10.5 mg/dL   GFR calc non Af Amer 58 (*) >90 mL/min   GFR calc Af Amer 67 (*) >90 mL/min    Intake/Output Summary (Last 24 hours) at 04/04/12 1014 Last data filed at 04/04/12 0802  Gross per 24 hour  Intake   1275 ml  Output   3080 ml  Net  -1805 ml    ASSESSMENT AND PLAN:  HTN:  BPs are labile but predominantly elevated.  Meds have been changed significantly compared with home medications.  I changed Cozaar to bid yesterday.  Her BP is overall a little better.  She did require IV hydralazine yesterday.  Continue current therapy.  CAD:  No active ischemia.  Continue with medical management.  Diastolic HF:  She seems to be euvolemic.  Continue current therapy.    Jeneen Rinks Raife Lizer 04/04/2012 10:14 AM

## 2012-04-04 NOTE — Progress Notes (Signed)
SLP Cancellation Note  ST received order for BSE this date. Evaluation deferred to 04/05/12. Review of progress noted indicates patient has had 3 prior swallow evaluations during current hospital course of stay. Per caregiver report patient is "pocketing" solids. ST to address next date. Patient is currently on ST caseload for cognitive treatment.   Sharman Crate [place], [place]  [facility] 04/04/2012, 3:50 PM

## 2012-04-05 ENCOUNTER — Encounter (HOSPITAL_COMMUNITY): Payer: Self-pay | Admitting: *Deleted

## 2012-04-05 ENCOUNTER — Inpatient Hospital Stay (HOSPITAL_COMMUNITY)
Admission: RE | Admit: 2012-04-05 | Discharge: 2012-04-12 | DRG: 945 | Disposition: A | Discharge status: Other Acute Inpatient Hospital | Payer: No Typology Code available for payment source | Source: Ambulatory Visit | Attending: Physical Medicine & Rehabilitation | Admitting: Physical Medicine & Rehabilitation

## 2012-04-05 DIAGNOSIS — I609 Nontraumatic subarachnoid hemorrhage, unspecified: Secondary | ICD-10-CM

## 2012-04-05 DIAGNOSIS — E871 Hypo-osmolality and hyponatremia: Secondary | ICD-10-CM | POA: Diagnosis present

## 2012-04-05 DIAGNOSIS — R1310 Dysphagia, unspecified: Secondary | ICD-10-CM | POA: Diagnosis present

## 2012-04-05 DIAGNOSIS — Z8679 Personal history of other diseases of the circulatory system: Secondary | ICD-10-CM | POA: Diagnosis present

## 2012-04-05 DIAGNOSIS — I5033 Acute on chronic diastolic (congestive) heart failure: Secondary | ICD-10-CM | POA: Diagnosis present

## 2012-04-05 DIAGNOSIS — I10 Essential (primary) hypertension: Secondary | ICD-10-CM

## 2012-04-05 DIAGNOSIS — F17200 Nicotine dependence, unspecified, uncomplicated: Secondary | ICD-10-CM | POA: Diagnosis present

## 2012-04-05 DIAGNOSIS — Z7982 Long term (current) use of aspirin: Secondary | ICD-10-CM

## 2012-04-05 DIAGNOSIS — I129 Hypertensive chronic kidney disease with stage 1 through stage 4 chronic kidney disease, or unspecified chronic kidney disease: Secondary | ICD-10-CM | POA: Diagnosis present

## 2012-04-05 DIAGNOSIS — G911 Obstructive hydrocephalus: Secondary | ICD-10-CM

## 2012-04-05 DIAGNOSIS — Z5189 Encounter for other specified aftercare: Principal | ICD-10-CM

## 2012-04-05 DIAGNOSIS — R4789 Other speech disturbances: Secondary | ICD-10-CM | POA: Diagnosis present

## 2012-04-05 DIAGNOSIS — I701 Atherosclerosis of renal artery: Secondary | ICD-10-CM

## 2012-04-05 DIAGNOSIS — I252 Old myocardial infarction: Secondary | ICD-10-CM

## 2012-04-05 DIAGNOSIS — N189 Chronic kidney disease, unspecified: Secondary | ICD-10-CM | POA: Diagnosis present

## 2012-04-05 DIAGNOSIS — I2510 Atherosclerotic heart disease of native coronary artery without angina pectoris: Secondary | ICD-10-CM

## 2012-04-05 DIAGNOSIS — G40909 Epilepsy, unspecified, not intractable, without status epilepticus: Secondary | ICD-10-CM | POA: Diagnosis present

## 2012-04-05 DIAGNOSIS — K5900 Constipation, unspecified: Secondary | ICD-10-CM | POA: Diagnosis present

## 2012-04-05 LAB — BASIC METABOLIC PANEL
Calcium: 9.7 mg/dL (ref 8.4–10.5)
Chloride: 98 mEq/L (ref 96–112)
Creatinine, Ser: 1.12 mg/dL — ABNORMAL HIGH (ref 0.50–1.10)
GFR calc Af Amer: 65 mL/min — ABNORMAL LOW (ref >90)
GFR calc non Af Amer: 56 mL/min — ABNORMAL LOW (ref >90)

## 2012-04-05 LAB — ALDOSTERONE + RENIN ACTIVITY W/ RATIO: Aldosterone: 1 ng/dL

## 2012-04-05 MED ORDER — LOSARTAN POTASSIUM 25 MG PO TABS
25.0000 mg | ORAL_TABLET | Freq: Two times a day (BID) | ORAL | Status: DC
  Administered 2012-04-05 – 2012-04-09 (×7): 25 mg via ORAL
  Filled 2012-04-05 (×12): qty 1

## 2012-04-05 MED ORDER — SPIRONOLACTONE 25 MG PO TABS
25.0000 mg | ORAL_TABLET | Freq: Every day | ORAL | Status: DC
  Administered 2012-04-06 – 2012-04-11 (×5): 25 mg via ORAL
  Filled 2012-04-05 (×8): qty 1

## 2012-04-05 MED ORDER — ONDANSETRON HCL 4 MG/2ML IJ SOLN: 4.0000 mg | Freq: Four times a day (QID) | INTRAMUSCULAR | Status: DC | PRN

## 2012-04-05 MED ORDER — ACETAMINOPHEN 325 MG PO TABS
325.0000 mg | ORAL_TABLET | ORAL | Status: DC | PRN
  Administered 2012-04-06 – 2012-04-10 (×5): 650 mg via ORAL
  Filled 2012-04-05 (×6): qty 2

## 2012-04-05 MED ORDER — POLYETHYLENE GLYCOL 3350 17 G PO PACK
17.0000 g | PACK | Freq: Every day | ORAL | Status: DC | PRN
  Administered 2012-04-07: 17 g via ORAL
  Filled 2012-04-05: qty 1

## 2012-04-05 MED ORDER — BIOTENE DRY MOUTH MT LIQD
15.0000 mL | Freq: Four times a day (QID) | OROMUCOSAL | Status: DC
  Administered 2012-04-06 – 2012-04-11 (×12): 15 mL via OROMUCOSAL

## 2012-04-05 MED ORDER — LABETALOL HCL 200 MG PO TABS: 400.0000 mg | ORAL_TABLET | Freq: Three times a day (TID) | ORAL | Status: DC

## 2012-04-05 MED ORDER — ONDANSETRON HCL 4 MG PO TABS: 4.0000 mg | ORAL_TABLET | Freq: Four times a day (QID) | ORAL | Status: DC | PRN

## 2012-04-05 MED ORDER — NITROGLYCERIN 2 % TD OINT: 1.0000 [in_us] | TOPICAL_OINTMENT | Freq: Four times a day (QID) | TRANSDERMAL | Status: DC | PRN

## 2012-04-05 MED ORDER — SENNOSIDES-DOCUSATE SODIUM 8.6-50 MG PO TABS
1.0000 | ORAL_TABLET | Freq: Two times a day (BID) | ORAL | Status: DC
  Administered 2012-04-05 – 2012-04-09 (×7): 1 via ORAL
  Filled 2012-04-05 (×11): qty 1

## 2012-04-05 MED ORDER — HYDRALAZINE HCL 50 MG PO TABS
100.0000 mg | ORAL_TABLET | Freq: Three times a day (TID) | ORAL | Status: DC
  Administered 2012-04-05 – 2012-04-11 (×14): 100 mg via ORAL
  Filled 2012-04-05 (×23): qty 2

## 2012-04-05 MED ORDER — SORBITOL 70 % SOLN
30.0000 mL | Freq: Every day | Status: DC | PRN
  Administered 2012-04-07: 30 mL via ORAL
  Filled 2012-04-05: qty 30

## 2012-04-05 MED ORDER — LABETALOL HCL 200 MG PO TABS
400.0000 mg | ORAL_TABLET | Freq: Three times a day (TID) | ORAL | Status: DC
  Administered 2012-04-05 – 2012-04-11 (×14): 400 mg via ORAL
  Filled 2012-04-05 (×23): qty 2

## 2012-04-05 MED ORDER — CLONIDINE HCL 0.3 MG PO TABS
0.3000 mg | ORAL_TABLET | Freq: Three times a day (TID) | ORAL | Status: DC
  Administered 2012-04-05 – 2012-04-11 (×13): 0.3 mg via ORAL
  Filled 2012-04-05 (×20): qty 1

## 2012-04-05 MED ORDER — LOSARTAN POTASSIUM 25 MG PO TABS: 25.0000 mg | ORAL_TABLET | Freq: Two times a day (BID) | ORAL | Status: DC

## 2012-04-05 MED ORDER — LEVETIRACETAM 500 MG/5ML IV SOLN
500.0000 mg | Freq: Two times a day (BID) | INTRAVENOUS | Status: DC
  Administered 2012-04-06: 500 mg via INTRAVENOUS
  Filled 2012-04-05: qty 5

## 2012-04-05 MED ORDER — SPIRONOLACTONE 25 MG PO TABS: 25.0000 mg | ORAL_TABLET | Freq: Every day | ORAL | Status: DC

## 2012-04-05 MED ORDER — AMLODIPINE BESYLATE 10 MG PO TABS
10.0000 mg | ORAL_TABLET | Freq: Every day | ORAL | Status: DC
  Administered 2012-04-06 – 2012-04-09 (×4): 10 mg via ORAL
  Filled 2012-04-05 (×8): qty 1

## 2012-04-05 NOTE — Discharge Summary (Signed)
Physician Discharge Summary  Patient ID: [name] MRN: [id number] DOB/AGE: 03/23/1961 51 y.o.  Admit date: 03/03/2012 Discharge date: 04/05/2012  Admission Diagnoses:Principal Problem:  *SAH (subarachnoid hemorrhage) Active Problems:  CAD, NATIVE VESSEL  DIASTOLIC HEART FAILURE, CHRONIC  CHRONIC KIDNEY DISEASE UNSPECIFIED  Acute respiratory failure  Acidosis  Severe hypertension  Protein calorie malnutrition  Renal artery stenosis   Discharge Diagnoses:  Principal Problem:  *SAH (subarachnoid hemorrhage) Active Problems:  CAD, NATIVE VESSEL  DIASTOLIC HEART FAILURE, CHRONIC  CHRONIC KIDNEY DISEASE UNSPECIFIED  Acute respiratory failure  Acidosis  Severe hypertension  Protein calorie malnutrition  Renal artery stenosis   Discharged Condition: good  Hospital Course: [name] was admitted to the hospital on 03/03/2012 with [facility] due to a ruptured basilar artery aneurysm. She was also found to have 4 aneurysms, 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 This was treated with endovascular coiling. I had also placed a ventricular catheter due to obstructive hydrocephalus on admission. She did languish after the treatment, with persistent hypertension which was quite difficult to control with oral meds. Her improvement was very slow but did happen. She at discharge is alert, oriented to person, but is confused at times. She did not have the other aneurysms treated, but is not a good surgical candidate, thus I will make arrangements for endovascular treatment. She had a permanent VP shunt placed at 100 mm pressure codman programmable valve. Wound is clean dry and without infection currently. She well be discharged to rehab.   Consults: pulmonary/intensive care  Significant Diagnostic Studies: angiography: cerebral  Treatments: surgery: VP shunt creation, endovascular coiling basilar artery aneurysm.  Discharge Exam:alert not oriented other  than person consistently. Follows commands. Perrl, full eom, symmetric fAcies. Tongue and uvula midline. Hearing intact to voice. Moving all extremities. Wounds clean and dry. No signs of infection. Blood pressure 175/86, pulse 75, temperature 98.2 F (36.8 C), temperature source Oral, resp. rate 18, height 5\' 1"  (1.549 m), weight 73.4 kg (161 lb 13.1 oz), SpO2 100.00%.   Disposition: 01-Home or Self Care   Medication List  As of 04/05/2012  4:28 PM   STOP taking these medications         esomeprazole 40 MG capsule      nicotine 21 mg/24hr patch         TAKE these medications         amLODipine 10 MG tablet   Commonly known as: NORVASC   Take 1 tablet (10 mg total) by mouth daily. This is for blood pressure      aspirin EC 81 MG tablet   Take 1 tablet (81 mg total) by mouth daily. This is for your heart      carvedilol 12.5 MG tablet   Commonly known as: COREG   Take 1 tablet (12.5 mg total) by mouth 2 (two) times daily with a meal.      hydrALAZINE 50 MG tablet   Commonly known as: APRESOLINE   Take 1 tablet (50 mg total) by mouth 3 (three) times daily. This is for blood pressure      labetalol 200 MG tablet   Commonly known as: NORMODYNE   Take 2 tablets (400 mg total) by mouth 3 (three) times daily.      losartan 25 MG tablet   Commonly known as: COZAAR   Take 1 tablet (25 mg total) by mouth 2 (two) times daily.      nitroGLYCERIN 0.4 MG SL tablet  Commonly known as: NITROSTAT   Place 0.4 mg under the tongue every 5 (five) minutes as needed. For chest pain      nitroGLYCERIN 2 % ointment   Commonly known as: NITROGLYN   Place 1 inch onto the skin every 6 (six) hours as needed (SBP [id number] systolic).      spironolactone 25 MG tablet   Commonly known as: ALDACTONE   Take 1 tablet (25 mg total) by mouth daily.             Signed: Jacobs Golab L 04/05/2012, 4:28 PM

## 2012-04-05 NOTE — Progress Notes (Signed)
Pt. Arrived to rehab unit at 1815 from 3300, VSS, patient alert to self only.  All personal belongings in tow.  Report received from [place], [place].  Pt. Oriented to rehab unit.

## 2012-04-05 NOTE — Progress Notes (Signed)
Physical Therapy Treatment Patient Details Name: [name] MRN: [id number] DOB: [birth year] Today's Date: 04/05/2012 Time: [id number] PT Time Calculation (min): 21 min  PT Assessment / Plan / Recommendation Comments on Treatment Session  pt presents with SAH.  pt with very flat affect and slow initiation.  pt trunk and neck remains very tight in flexion and pt indicates it has been like this for a while.  Attempted stretching while in sitting.      Follow Up Recommendations  Inpatient Rehab    Barriers to Discharge        Equipment Recommendations  Defer to next venue    Recommendations for Other Services Rehab consult  Frequency Min 3X/week   Plan Discharge plan remains appropriate;Frequency remains appropriate    Precautions / Restrictions Precautions Precautions: Fall Restrictions Weight Bearing Restrictions: No   Pertinent Vitals/Pain Denies pain.      Mobility  Bed Mobility Bed Mobility: Supine to Sit;Sitting - Scoot to Edge of Bed Supine to Sit: 1: +2 Total assist Supine to Sit: Patient Percentage: 40% Sitting - Scoot to Edge of Bed: 1: +2 Total assist Sitting - Scoot to Edge of Bed: Patient Percentage: 30% Details for Bed Mobility Assistance: Max cueing for safe technique.  Increased time needed for initiation and attending to task Transfers Transfers: Sit to Stand;Stand to Sit;Stand Pivot Transfers Sit to Stand: 1: +2 Total assist;With upper extremity assist;From elevated surface;From bed Sit to Stand: Patient Percentage: 40% Stand to Sit: 1: +2 Total assist;With upper extremity assist;With armrests;To chair/3-in-1 Stand to Sit: Patient Percentage: 40% Stand Pivot Transfers: 1: +2 Total assist Stand Pivot Transfers: Patient Percentage: 40% Details for Transfer Assistance: Max cueing for safe technique.  A needed for movement of Bil LEs and to initiate wt shifting to move LEs.   Ambulation/Gait Ambulation/Gait Assistance: Not tested (comment) Stairs:  No Wheelchair Mobility Wheelchair Mobility: No    Exercises     PT Diagnosis:    PT Problem List:   PT Treatment Interventions:     PT Goals Acute Rehab PT Goals Time For Goal Achievement: 04/13/12 PT Goal: Supine/Side to Sit - Progress: Progressing toward goal PT Goal: Sit at Edge Of Bed - Progress: Progressing toward goal PT Goal: Sit to Supine/Side - Progress: Progressing toward goal PT Goal: Sit to Stand - Progress: Progressing toward goal PT Goal: Stand to Sit - Progress: Progressing toward goal PT Transfer Goal: Bed to Chair/Chair to Bed - Progress: Progressing toward goal PT Goal: Stand - Progress: Progressing toward goal  Visit Information  Last PT Received On: 04/05/12 Assistance Needed: +2 PT/OT Co-Evaluation/Treatment: Yes    Subjective Data  Subjective: pt with minimal verbalizations and needs encouragement to respond.     Cognition  Overall Cognitive Status: Impaired Area of Impairment: Memory;Attention Arousal/Alertness: Awake/alert Orientation Level: Time;Situation;Place;Disoriented to Behavior During Session: Flat affect Current Attention Level: Sustained Memory Deficits: decreased recall of information Awareness of Errors: Assistance required to identify errors made;Assistance required to correct errors made Awareness of Deficits: Impaired awareness Problem Solving: slow to process    Balance  Balance Balance Assessed: Yes Static Sitting Balance Static Sitting - Balance Support: Bilateral upper extremity supported;Feet supported Static Sitting - Level of Assistance: 4: Min assist Static Sitting - Comment/# of Minutes: pt requires consistent MinA to maintain balance and increased A needed when attempting trunk/cervical extension  End of Session PT - End of Session Equipment Utilized During Treatment: Gait belt Activity Tolerance: Patient limited by fatigue Patient left: in chair;with call bell/phone  within reach Nurse Communication: Mobility status     Catarina Hartshorn, [place] 04/05/2012, 10:51 AM

## 2012-04-05 NOTE — Progress Notes (Signed)
Occupational Therapy Treatment Patient Details Name: [name] MRN: [id number] DOB: 03-10-1961 Today's Date: 04/05/2012 Time: [id number] OT Time Calculation (min): 17 min  OT Assessment / Plan / Recommendation Comments on Treatment Session Pt. with improved participation today, however continues to demonstrate decreased activity tolerance.    Follow Up Recommendations  Inpatient Rehab;Supervision/Assistance - 24 hour       Equipment Recommendations  Defer to next venue    Recommendations for Other Services Rehab consult  Frequency Min 3X/week   Plan Discharge plan remains appropriate    Precautions / Restrictions Precautions Precautions: Fall Restrictions Weight Bearing Restrictions: No       ADL  Grooming: Performed;Wash/dry hands;Wash/dry face;Supervision/safety Where Assessed - Grooming: Unsupported sitting Upper Body Dressing: Performed;Maximal assistance (don gown) Where Assessed - Upper Body Dressing: Unsupported sitting Toilet Transfer: Simulated;+2 Total assistance Toilet Transfer: Patient Percentage: 40% Toilet Transfer Method: Stand pivot Toilet Transfer Equipment: Other (comment) (recliner) Transfers/Ambulation Related to ADLs: Mod verbal cues for safe hand placement and transfer technique ADL Comments: Pt. completed EOB ADL tasks with min guard assist due to posterior lean. Pt. with kyphotic posture and provided with manual facilitation at trunk and shoulders to facilitate upright position.       OT Goals Acute Rehab OT Goals OT Goal Formulation: Patient unable to participate in goal setting Time For Goal Achievement: 04/13/12 Potential to Achieve Goals: Good ADL Goals Pt Will Perform Grooming: with min assist;Sitting, edge of bed ADL Goal: Grooming - Progress: Goal set today Pt Will Transfer to Toilet: with mod assist;Stand pivot transfer;3-in-1 ADL Goal: Toilet Transfer - Progress: Progressing toward goals Additional ADL Goal #1: Pt. will demonstrate  bil. UE strength 3+/5 shoulders; 4/5 elbows and hands ADL Goal: Additional Goal #1 - Progress: Progressing toward goals Additional ADL Goal #2: Pt. will be oriented to place and situation with the use of external cues ADL Goal: Additional Goal #2 - Progress: Progressing toward goals  Visit Information  Last OT Received On: 04/05/12 Assistance Needed: +2 PT/OT Co-Evaluation/Treatment: Yes          Cognition  Overall Cognitive Status: Impaired Area of Impairment: Memory;Attention Arousal/Alertness: Awake/alert Orientation Level: Time;Situation;Place;Disoriented to Behavior During Session: Flat affect Current Attention Level: Sustained Memory Deficits: decreased recall of information Awareness of Errors: Assistance required to identify errors made;Assistance required to correct errors made Awareness of Deficits: Impaired awareness Problem Solving: slow to process    Mobility Bed Mobility Bed Mobility: Supine to Sit;Sitting - Scoot to Edge of Bed Supine to Sit: 1: +2 Total assist Supine to Sit: Patient Percentage: 40% Sitting - Scoot to Edge of Bed: 1: +2 Total assist Sitting - Scoot to Edge of Bed: Patient Percentage: 30% Details for Bed Mobility Assistance: Max cueing for safe technique.  Increased time needed for initiation and attending to task Transfers Sit to Stand: 1: +2 Total assist;With upper extremity assist;From elevated surface;From bed Sit to Stand: Patient Percentage: 40% Stand to Sit: 1: +2 Total assist;With upper extremity assist;With armrests;To chair/3-in-1 Stand to Sit: Patient Percentage: 40% Details for Transfer Assistance: Max cueing for safe technique.  A needed for movement of Bil LEs and to initiate wt shifting to move LEs.           End of Session OT - End of Session Equipment Utilized During Treatment: Gait belt Activity Tolerance: Patient tolerated treatment well Patient left: in chair;with call bell/phone within reach Nurse Communication: Mobility  status   Lavona Mound, OTR/L Pager [phone] 04/05/2012, 2:17 PM

## 2012-04-05 NOTE — Progress Notes (Signed)
Clinical Social Work  Per chart review, patient has been accepted for CIR and dtr is not agreeable to SNF as back up option. If CIR does not accept, dtr has reported she will care for patient. At this time, CSW is signing off but available if needed.  [place], [place] [phone]

## 2012-04-05 NOTE — H&P (Signed)
Physical Medicine and Rehabilitation Admission H&P    Chief Complaint  Patient presents with  . Hypertension  : HPI: [name] is a 51 y.o. right-handed female with previous intraparenchymal hemorrhage in the past admitted 03/03/2012 with headache, nausea and vomiting. CT of the head showed diffuse subarachnoid hemorrhage in the basilar cisterns, sylvian fissures, along the tentorium and anterior falx. Followup neurosurgery Dr. Cyndy Freeze. Patient had been intubated and placement of ventriculostomy catheter. Patient was later extubated on 03/04/2012 Hospital course with obstructive hydrocephalus and ultimately underwent shunt insertion ventriculoperitoneal right side on 03/27/2012. Patient did again require intubation due to respiratory difficulties and again extubated 03/29/2012. Latest followup CT scan showed satisfactory appearance status post VP shunting. Maintained on Keppra for seizure prophylaxis. Patient is currently tolerating a dysphagia 3 thin liquid diet. Blood pressures have been difficult to manage with followup per cardiology services as well as critical care medicine with question plan to check renal arteries for any stenosis that could be affecting her hypertension.  Physical therapy evaluation occupational therapy completed 03/30/2012 with recommendations for physical medicine rehabilitation consult to consider inpatient rehabilitation services     Review of Systems  Gastrointestinal: Positive for nausea, vomiting and constipation.  Neurological: Positive for headaches.  All other systems reviewed and are negative   Past Medical History  Diagnosis Date  . HTN (hypertension)     resistant  . Hypercholesterolemia   . Tobacco abuse     resumed smoking half a pack a day. She was a smoker in the past and states she was abl eto stop in the past using nicotine patches  . CAD (coronary artery disease)     Pt has St elevation MI in Feb 2009. Left hear tcath at tha ttime showed a 70%  distal LAD lesion that was hazy consistent with a plaque rupture. She had a 50% distal circumflex stenosis an a 95% distal RCA stenosis. She did havve drug eluting stent placed in her distal RCA  . Chronic kidney disease     most recent creatinine 1.4 in 3/10  . Intracranial hemorrhage, spontaneous intraparenchymal, idiopathic, remote, resolved   . Diastolic heart failure     pt most recent echo was in Feb 2009 w an EF of 60% and severe left ventricular hypertrophy. The pt did have evidence of diastolic dysfunction. The RV appeared normal  . Subarachnoid hemorrhage     03/2012   Past Surgical History  Procedure Date  . Ventriculoperitoneal shunt 03/27/2012    Procedure: SHUNT INSERTION VENTRICULAR-PERITONEAL;  Surgeon: Winfield Cunas, MD;  Location: [facility];  Service: Neurosurgery;  Laterality: N/A;  Ventricular-Peritoneal Shunt Insertion   Family History  Problem Relation Age of Onset  . Coronary artery disease Other     premature in 1st degree relatives   Social History:  reports that she has been smoking.  She does not have any smokeless tobacco history on file. She reports that she does not drink alcohol or use illicit drugs. Allergies:  Allergies  Allergen Reactions  . Ampicillin Other (See Comments)    unknown  . Penicillins Other (See Comments)    unknown   Medications Prior to Admission  Medication Sig Dispense Refill  . amLODipine (NORVASC) 10 MG tablet Take 1 tablet (10 mg total) by mouth daily. This is for blood pressure  30 tablet  6  . aspirin EC 81 MG tablet Take 1 tablet (81 mg total) by mouth daily. This is for your heart      .  carvedilol (COREG) 12.5 MG tablet Take 1 tablet (12.5 mg total) by mouth 2 (two) times daily with a meal.  60 tablet  11  . hydrALAZINE (APRESOLINE) 50 MG tablet Take 1 tablet (50 mg total) by mouth 3 (three) times daily. This is for blood pressure  90 tablet  6  . nitroGLYCERIN (NITROSTAT) 0.4 MG SL tablet Place 0.4 mg under the tongue  every 5 (five) minutes as needed. For chest pain        Home: Home Living Lives With: Daughter Available Help at Discharge: Family;Available 24 hours/day (per RN, family reports 24 hour assist is available) Type of Home: House Home Access: Stairs to enter [employer] of Steps: 4 Entrance Stairs-Rails: Can reach both Home Layout: One level Bathroom Shower/Tub: Tub/shower unit;Door Additional Comments: Family not available to confirm living environment   Functional History: Prior Function Able to Take Stairs?: Yes Driving: Yes Vocation: On disability  Functional Status:  Mobility: Bed Mobility Bed Mobility: Supine to Sit;Sit to Supine;Sitting - Scoot to Edge of Bed Supine to Sit: 1: +2 Total assist Supine to Sit: Patient Percentage: 40% Sitting - Scoot to Edge of Bed: 2: Max assist Sit to Supine: 1: +2 Total assist;HOB flat Sit to Supine: Patient Percentage: 20% Transfers Transfers: Programmer, applications Transfers: 1: +2 Total assist Stand Pivot Transfers: Patient Percentage: 40%      ADL: ADL Eating/Feeding: NPO Grooming: Performed;Wash/dry hands;Wash/dry face;Supervision/safety (apply lotion to hands) Where Assessed - Grooming: Supine, head of bed up Upper Body Bathing: Simulated;Moderate assistance Where Assessed - Upper Body Bathing: Supine, head of bed up Lower Body Bathing: Simulated;Maximal assistance Where Assessed - Lower Body Bathing: Supine, head of bed up;Rolling right and/or left Upper Body Dressing: Simulated;+1 Total assistance Where Assessed - Upper Body Dressing: Supported sitting Lower Body Dressing: Simulated;+1 Total assistance Where Assessed - Lower Body Dressing: Supine, head of bed up;Supine, head of bed flat;Rolling right and/or left ADL Comments: Pt. reluctantly agreed to OT due to fatigue.  Pt. moved to EOB with max A.  Pt. continues with flexion of trunk and neck flexion.  Thoracic and lumbar mobilization performed with  facilitation at pelvis, rib cage, and head.  Able to hold upright position for 5-10 second periods.  Pt. sat EOB x 13 mins, before fatiguing.  Pt. returned to supine with total A +2 (pt. ~20%).  Pt. positioned on towel roll along the length of his spine with a folded towel under head to facilitate trunk extension and neck extension.  Asked RN to check and/or remove towel roll in 30-60 mins.  Cognition: Cognition Overall Cognitive Status: Impaired Arousal/Alertness: Awake/alert Orientation Level: Oriented to person;Oriented to place Attention: Focused Focused Attention: Impaired Focused Attention Impairment: Verbal basic;Functional basic Memory: Impaired Memory Impairment: Storage deficit;Retrieval deficit Awareness: Impaired Awareness Impairment: Intellectual impairment Behaviors: Other (comment) (inert) Safety/Judgment: Impaired Cognition Overall Cognitive Status: Impaired Area of Impairment: Memory;Attention Arousal/Alertness: Awake/alert Orientation Level: Time;Situation;Place;Disoriented to Behavior During Session: Flat affect Current Attention Level: Sustained Memory Deficits: decreased recall of information Awareness of Errors: Assistance required to identify errors made;Assistance required to correct errors made Awareness of Deficits: Impaired awareness Problem Solving: slow to process Cognition - Other Comments: Pt. disoriented to day, and time, but when given hints, she was able to deduce correct answers.  Not oriented to place, and unable to retain information 5 mins. later   Blood pressure 150/82, pulse 76, temperature 98.3 F (36.8 C), temperature source Oral, resp. rate 18, height 5\' 1"  (1.549 m),  weight 73.4 kg (161 lb 13.1 oz), SpO2 100.00%. Physical Exam lethargic, Throat: oral mucosa moist Neck: Neck supple. No thyromegaly present.  Cardiovascular: Regular rhythm. No murmurs, rubs, or gallops.  Pulmonary/Chest:  Decreased breath sounds at the bases , no distress,  no wheezes, rales, or rhonchi Abdominal: She exhibits no distension. There is no tenderness. Bowel sounds are positive. Neurological:  Patient is extremely lethargic, encephalopathic appearing, but makes  eye contact with examiner. No gross CN abnormalities, other than pupils are sluggish in reaction.  She followed basic, but was inconsistent with one and two-step commands. She moved the RUE grossly 2-3, LUE 1-2, Bilateral LE's 1+ to 2. She withdraws to pain in all 4's.  Skin:  VP shunt site dressed, staples in place. Area slightly tender to touch. No drainage.  motor strength 4/5 in bilateral hand grip 3 minus bilateral deltoid 4 minus bilateral biceps and triceps 3 minus bilateral hip flexors knee extensors and ankle dorsiflexors  Sensation to light touch is intact  Orientation is to person, "right around the corner from [place]", not to time, admitted that she had bleeding in her brain      Results for orders placed during the hospital encounter of 03/03/12 (from the past 48 hour(s))  BASIC METABOLIC PANEL     Status: Abnormal   Collection Time   04/04/12  7:25 AM      Component Value Range Comment   Sodium 136  135 - 145 mEq/L    Potassium 4.9  3.5 - 5.1 mEq/L    Chloride 100  96 - 112 mEq/L    CO2 24  19 - 32 mEq/L    Glucose, Bld 103 (*) 70 - 99 mg/dL    BUN 21  6 - 23 mg/dL    Creatinine, Ser 1.09  0.50 - 1.10 mg/dL    Calcium 9.7  8.4 - 10.5 mg/dL    GFR calc non Af Amer 58 (*) >90 mL/min    GFR calc Af Amer 67 (*) >90 mL/min   BASIC METABOLIC PANEL     Status: Abnormal   Collection Time   04/05/12  4:23 AM      Component Value Range Comment   Sodium 133 (*) 135 - 145 mEq/L    Potassium 5.0  3.5 - 5.1 mEq/L    Chloride 98  96 - 112 mEq/L    CO2 25  19 - 32 mEq/L    Glucose, Bld 111 (*) 70 - 99 mg/dL    BUN 20  6 - 23 mg/dL    Creatinine, Ser 1.12 (*) 0.50 - 1.10 mg/dL    Calcium 9.7  8.4 - 10.5 mg/dL    GFR calc non Af Amer 56 (*) >90 mL/min    GFR calc Af Amer 65  (*) >90 mL/min    No results found.  Post Admission Physician Evaluation: 1. Functional deficits secondary  to [facility] with hydrocephalus and VP shunt. 2. Patient is admitted to receive collaborative, interdisciplinary care between the physiatrist, rehab nursing staff, and therapy team. 3. Patient's level of medical complexity and substantial therapy needs in context of that medical necessity cannot be provided at a lesser intensity of care such as a SNF. 4. Patient has experienced substantial functional loss from his/her baseline which was documented above under the "Functional History" and "Functional Status" headings.  Judging by the patient's diagnosis, physical exam, and functional history, the patient has potential for functional progress which will result in measurable gains while  on inpatient rehab.  These gains will be of substantial and practical use upon discharge  in facilitating mobility and self-care at the household level. 5. Physiatrist will provide 24 hour management of medical needs as well as oversight of the therapy plan/treatment and provide guidance as appropriate regarding the interaction of the two. 6. 24 hour rehab nursing will assist with bladder management, bowel management, safety, skin/wound care, disease management, medication administration, pain management and patient education  and help integrate therapy concepts, techniques,education, etc. 7. PT will assess and treat for:  fxnl mobility, NMR, LES, AET, safety, CPT.  Goals are: min assist. 8. OT will assess and treat for: UES, ADL's, fxnl mobility, safety, AET, CPT.   Goals are: min to mod assist. 9. SLP will assess and treat for: cognition, communication, swallowing.  Goals are: supervision to mod assist. 10. Case Management and Social Worker will assess and treat for psychological issues and discharge planning. 11. Team conference will be held weekly to assess progress toward goals and to determine barriers to  discharge. 12. Patient will receive at least 3 hours of therapy per day at least 5 days per week. 13. ELOS: 3-4 weeks      Prognosis:  good   Medical Problem List and Plan: 1. Subarachnoid hemorrhage status post VP shunting 03/27/2012 2. DVT Prophylaxis/Anticoagulation: SCDs. Monitor for DVT 3. Seizure prophylaxis. Keppra 500 mg every 12 hours. Monitor for any seizure activity 4. Dysphasia. Diet been advanced to mechanical soft thin liquids. Monitor for any signs of aspiration 5. Hypertension. Norvasc 10 mg daily, clonidine 0.3 mg every 8 hours, hydralazine 100 mg every 8 hours, labetalol 400 mg 3 times a day, Cozaar 25 mg twice a day and Aldactone 25 mg daily. Remains poorly controlled. Discuss with cardiology services plan to check renal arteries for any stenosis. 6. Arousal: maximize sleep wake cycle. Probably would benefit from a stimulant but i don't want to exacerbate her HTN any further. Will consider moving forward based on arousal levels and BP.  04/05/2012, Oval Linsey, MD

## 2012-04-05 NOTE — Progress Notes (Signed)
Patient ID: [name], female   DOB: Dec 12, 1960, 51 y.o.   MRN: [id number]   I added additional comments to my note from this morning. The patient has had a renal artery Doppler at some time during this hospitalization revealing renal artery stenosis. It  will be optimal to assess this further over time as she may benefit from a renal artery intervention. However this cannot be done currently because of her subarachnoid bleed. She is not a candidate for the antiplatelet therapy that would be needed if she were to be a candidate for a stent to a renal artery.  Daryel November, MD

## 2012-04-05 NOTE — Discharge Instructions (Signed)
Cerebral Aneurysm A cerebral aneurysm is the bulging or ballooning out of part of the wall of a vein or artery in the brain. CAUSES Common causes include:   Congenital (present since birth) defects.   High blood pressure.   The build-up of fatty deposits in the arteries (atherosclerosis).   Blood vessels that develop abnormally.   Diseases that cause weakening and damage to the walls of blood vessels.  Uncommon causes include:  Head trauma (damage caused by an accident).   Infection.   Tumors.   Drug abuse (mostly from cocaine, heroin, and amphetamine use).  Cerebral aneurysms can occur at any age. They are more common in adults than in children. They and are slightly more common in women than in men.  SYMPTOMS  The signs and symptoms of an unruptured cerebral aneurysm will partly depend on its size and rate of growth.  A small, unchanging aneurysm will generally produce no symptoms. A larger aneurysm that is steadily growing may produce symptoms such as headache, neck stiffness or pain, loss of feeling in the face or problems with the eyes.  If an aneurysm bursts, the problem can be life-threatening. Symptoms may include:  A sudden and usually severe headache.   Neck stiffness or pain.   Confusion and/or drowsiness.   Problems speaking.   Weakness in an arm and/or a leg.   Nausea (feeling sick to your stomach).   Vision impairment.   Vomiting.   Loss of consciousness.  Rupture of a cerebral aneurysm results in bleeding in the brain, causing a stroke. Or, blood can leak into the area around the brain and develop into a blood clot within the skull. More problems can occur as a result of the aneurysm breaking. These include:  Re-bleeding.   Hydrocephalus (an increase in normal brain fluid in the chambers inside the brain).   Vasospasm (blood vessels decrease in size and starve the brain of nutrients and oxygen).  TREATMENT  Emergency treatment for a ruptured  cerebral aneurysm generally includes restoring breathing, and reducing pressure inside the head. Immediate emergency surgery may be recommended to help prevent damage caused by hydrocephalus and to reduce the risk of re-bleeding.  When aneurysms are discovered before rupture occurs, microcoil thrombosis or balloon embolization may be performed on patients for whom surgery is considered too risky. During these procedures, a thin, hollow tube (catheter) is inserted through an artery to travel up to the brain. Once the catheter reaches the aneurysm, tiny balloons or coils are used to block blood flow through the aneurysm. Other treatments may include:  Bed rest.   Drug therapy.   Hypertensive-hypervolemic therapy (which elevates blood pressure, increases blood volume, and thins the blood) to drive blood flow through and around blocked arteries and control vasospasm.  PROGNOSIS  The prognosis for a patient with a ruptured cerebral aneurysm depends on:  The extent and location of the aneurysm.   The person's age.   General health.   Neurological condition.  Some people with a ruptured cerebral aneurysm die from the initial bleeding. Others recover with little or no problems. Early diagnosis and treatment are important. Document Released: 06/28/2002 Document Revised: 09/25/2011 Document Reviewed: 09/07/2007 [facility] Patient Information 2012 [place].

## 2012-04-05 NOTE — Progress Notes (Signed)
Name: [name] MRN: [id number] DOB: August 31, 1961    LOS: 94  Referring Provider: Dr. Cyndy Freeze, neurosurgery Reason for Referral:  Reconsult for vent management  PULMONARY / CRITICAL CARE MEDICINE  Brief patient description: 51 yo with hx of HTN and previous ICH who presented to [facility] ED on 5/15 with severe hypertension and acute SAH.  Events Since Admission:  5/15 Presented to [facility] ED hypertensive with nausea, vomiting, headache and was found to have [place]. Intubated. Ventriculostomy placed.  5/16 Extubated  5/17 Some hypoxia, off nicardipine drip now  5/18 Variable O2 needs, pcxr edema, tachy, trop neg  5/19 Pulled out ventriculostomy catheter, episodes of tachycardia  5/20 Briefly required Cardene, ventricular drain replaced  5/28 Back on nicardipine  6/1 ventric lowered  6/8 Patient pulled out ventric.  Rt V-P Shunt placed by neurosurgery. 6/10 Extubated again 03/31/12 Renal Artery U/S>> abnormal waveform in Right renal artery noted 04/02/12 Transferred to SDU  Interval history:  No new complaints. No distress. BP control much improved  Vital Signs: Temp:  [97.6 F (36.4 C)-98.6 F (37 C)] 98.4 F (36.9 C) (06/17 1200) Pulse Rate:  [73-81] 75  (06/17 1000) Resp:  [15-24] 24  (06/17 1000) BP: (106-150)/(53-85) 145/85 mmHg (06/17 1436) SpO2:  [100 %] 100 % (06/17 1000) Weight:  [73.4 kg (161 lb 13.1 oz)] 73.4 kg (161 lb 13.1 oz) (06/17 0402)  Physical Examination:  Gen: awake and alert, speech clear HEENT: Scalp surgical wound well dressed, EOMi, Panda in place PULM: CTA B CV: RRR, systolic murmur noted, no JVD AB: BS+, soft, nontender, no hsm Ext: warm, RUE edema, no clubbing, no cyanosis, scd's Derm: no rash or skin breakdown Neuro: awake and alert, speech clear, generally weak all over   Principal Problem:  *SAH (subarachnoid hemorrhage) Active Problems:  Severe hypertension  DIASTOLIC HEART FAILURE, CHRONIC  Acidosis -resolved  CAD, NATIVE VESSEL  CHRONIC KIDNEY  DISEASE UNSPECIFIED  Acute respiratory failure - resolved  Protein calorie malnutrition  Renal artery stenosis   ASSESSMENT AND PLAN  Plans for CIR noted. BP control much improved. There is little further for PCCM to offer here. We will sign off. Please call if we can be of further assistance. If she is not going to move to CIR soon, suggest transfer to general med-surg floor   Merton Border, MD;  PCCM service; Mobile [phone]

## 2012-04-05 NOTE — Progress Notes (Signed)
Subjective: Patient denies pain (head or abdomen).  Objective: Vital signs in last 24 hours: Temp:  [97.6 F (36.4 C)-98.6 F (37 C)] 98.1 F (36.7 C) (06/17 0800) Pulse Rate:  [73-81] 81  (06/17 0800) Resp:  [15-20] 19  (06/17 0800) BP: (106-160)/(53-87) 150/82 mmHg (06/17 0619) SpO2:  [98 %-100 %] 100 % (06/17 0800) Weight:  [73.4 kg (161 lb 13.1 oz)] 73.4 kg (161 lb 13.1 oz) (06/17 0402)  Intake/Output from previous day: 06/16 0701 - 06/17 0700 In: 2490 [P.O.:840; I.V.:1650] Out: 2150 [Urine:2150] Intake/Output this shift: Total I/O In: 150 [I.V.:150] Out: 300 [Urine:300]  Alert, more conversant than this nurse's visit Friday. Scalp dressings removed to reveal well approximated incisions, staples present. No erythema, swelling, or drainage. Abdomen incision with Dermabond. No erythema, swelling, or tenderness. Pt reports no problems with bowel movements.  Lab Results: No results found for this basename: WBC:2,HGB:2,HCT:2,PLT:2 in the last 72 hours BMET  [facility] 04/05/12 0423 04/04/12 0725  NA 133* 136  K 5.0 4.9  CL 98 100  CO2 25 24  GLUCOSE 111* 103*  BUN 20 21  CREATININE 1.12* 1.09  CALCIUM 9.7 9.7    Studies/Results: No results found.  Assessment/Plan: Improving  LOS: 33 days  Continue support; mobilize. ?CIR status.   Verdis Prime 04/05/2012, 9:05 AM  Agree with above

## 2012-04-05 NOTE — Progress Notes (Signed)
Patient being transferred to 4003 on rehab per MD order. Report called to [place], Therapist, sports.

## 2012-04-05 NOTE — Progress Notes (Addendum)
Patient ID: [name], female   DOB: September 29, 1961, 51 y.o.   MRN: [id number]   SUBJECTIVE:  Patient is stable today. No chest pain or shortness of breath.   Filed Vitals:   04/04/12 2340 04/05/12 0211 04/05/12 0402 04/05/12 0619  BP: 106/53 120/72 145/81 150/82  Pulse: 80  76   Temp: 98.2 F (36.8 C)  98.3 F (36.8 C)   TempSrc: Oral  Oral   Resp: 19  18   Height:      Weight:   161 lb 13.1 oz (73.4 kg)   SpO2: 100%  100%     Intake/Output Summary (Last 24 hours) at 04/05/12 0736 Last data filed at 04/05/12 0600  Gross per 24 hour  Intake   2415 ml  Output   2150 ml  Net    265 ml    LABS: Basic Metabolic Panel:  Basename 04/05/12 0423 04/04/12 0725  NA 133* 136  K 5.0 4.9  CL 98 100  CO2 25 24  GLUCOSE 111* 103*  BUN 20 21  CREATININE 1.12* 1.09  CALCIUM 9.7 9.7  MG -- --  PHOS -- --   Liver Function Tests: No results found for this basename: AST:2,ALT:2,ALKPHOS:2,BILITOT:2,PROT:2,ALBUMIN:2 in the last 72 hours No results found for this basename: LIPASE:2,AMYLASE:2 in the last 72 hours CBC: No results found for this basename: WBC:2,NEUTROABS:2,HGB:2,HCT:2,MCV:2,PLT:2 in the last 72 hours Cardiac Enzymes: No results found for this basename: CKTOTAL:3,CKMB:3,CKMBINDEX:3,TROPONINI:3 in the last 72 hours BNP: No components found with this basename: POCBNP:3 D-Dimer: No results found for this basename: DDIMER:2 in the last 72 hours Hemoglobin A1C: No results found for this basename: HGBA1C in the last 72 hours Fasting Lipid Panel: No results found for this basename: CHOL,HDL,LDLCALC,TRIG,CHOLHDL,LDLDIRECT in the last 72 hours Thyroid Function Tests: No results found for this basename: TSH,T4TOTAL,FREET3,T3FREE,THYROIDAB in the last 72 hours  RADIOLOGY: Ct Head Wo Contrast  04/01/2012  *RADIOLOGY REPORT*  Clinical Data: Follow-up aneurysm clipping  CT HEAD WITHOUT CONTRAST  Technique:  Contiguous axial images were obtained from the base of the skull through  the vertex without contrast.  Comparison: Multiple priors.  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  Ventricular catheter crosses the midline and exits the third ventricle, lying with its tip either in the left cerebral peduncle or interpeduncular notch.  Ventricular size is normal on today's study, significantly improved from 03/24/2012.  Small amount of fluid surrounds the ventricular catheter in its course through the right frontal cortex and subcortical white matter, decreased from priors.  No visible acute stroke, new subarachnoid hemorrhage, mass lesion, or significant extra-axial fluid. 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  Endovascular coil basilar tip grossly unchanged.  IMPRESSION: Satisfactory appearance status post ventriculoperitoneal shunting. Ventricular size now within normal limits.  Original Report Authenticated By: Staci Righter, M.D.   Ct Head Wo Contrast  03/24/2012  *RADIOLOGY REPORT*  Clinical Data: Follow-up intracranial hemorrhage.  CT HEAD WITHOUT CONTRAST  Technique:  Contiguous axial images were obtained from the base of the skull through the vertex without contrast.  Comparison: 03/20/2012.  Findings: Stable ventricular catheter.  The ventricles are mildly enlarged but stable in size.  A small amount of interventricular hemorrhage is again demonstrated.  Persistent diffuse edema with compressed sulci. No subdural hematoma.  Compressed but stable CSF spaces around the brainstem.  IMPRESSION:  1.  Stable ventriculomegaly and diffuse cerebral edema. 2.  Stable ventricular catheter.  3.  Stable intraventricular hemorrhage.  Original Report Authenticated By: P. Kalman Jewels, M.D.   Ct Head Wo Contrast  03/20/2012  *RADIOLOGY REPORT*  Clinical Data: 51 year old female status post drain placement. History of ruptured cerebral aneurysm, subarachnoid hemorrhage,  ventriculostomy placement.  CT HEAD WITHOUT CONTRAST  Technique:  Contiguous axial images were obtained from the base of the skull through the vertex without contrast.  Comparison: 03/14/2012 and earlier.  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  Aneurysm clip and coil pack appears stable in configuration.  Sequelae of right frontotemporal craniotomy. Right frontal burr hole placement. Stable visualized osseous structures.  Visualized orbits and scalp soft tissues are within normal limits.  Right external ventricular drain is stable in position terminating in the right lateral ventricle.  A small volume of intraventricular hemorrhage has decreased.  Ventriculomegaly has not significantly changed.  Continued diffuse cerebral edema.  No acute cortically based infarct.  Stable basilar cisterns.  No midline shift.  IMPRESSION: 1.  Stable ventriculomegaly.  Right CVP placement appears stable. 2.  Decreased small volume of intraventricular hemorrhage.  No new intracranial hemorrhage. 3.  Continued diffuse cerebral edema.  Stable basilar cisterns. 4.  No new intracranial abnormality.  Original Report Authenticated By: Randall An, M.D.   Ct Head Wo Contrast  03/14/2012  *RADIOLOGY REPORT*  Clinical Data: Change in behavior, decreased level of consciousness.  CT HEAD WITHOUT CONTRAST  Technique:  Contiguous axial images were obtained from the base of the skull through the vertex without contrast.  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  Ventricular drainage catheter was self-removed on 05/21 and was reinserted the same day. The intraventricular catheter enters from a right frontal approach and lies completely in the right lateral ventricle.  There is slight hypodensity surrounding the  catheter which could represent passage of CSF along the track.  Intraventricular hemorrhage continues to layer in the posterior horns of the lateral ventricles.  The biventricular diameter is increased at 42 mm, which is greater than that seen on the most recent prior CT head of 37 mm.  Temporal horns remain to her June. Streak artifact from aneurysm clips and basilar coil mass obscure much of the suprasellar cistern regions.  No new subarachnoid blood is evident.  IMPRESSION: Moderate ventricular enlargement despite apparent satisfactory placement of intraventricular drainage catheter consistent with post subarachnoid hemorrhage hydrocephalus.  Correlate with CSF pressures and tube patency.  No new subarachnoid hemorrhage or interval significant intra-axial abnormality is observed.  Original Report Authenticated By: Staci Righter, M.D.   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  CT HEAD WITHOUT CONTRAST  Technique:  Contiguous axial images were obtained from the base of the skull through the vertex without contrast.  Comparison: Unenhanced cranial CT yesterday, 03/04/2012, 03/03/2012.  Findings: Metallic beam hardening streak artifact from the coils in the basilar tip aneurysm.  Low attenuation along the tract of the previous ventriculostomy catheter in the right frontal lobe, likely representing gliosis.  Interval enlargement of the lateral and third ventricles since yesterday, with marked enlargement of the temporal horns of the lateral ventricles.  Residual intraventricular hemorrhage layering posteriorly in the occipital horns and in the frontal horns.  Loss of all of the cortical sulci throughout both cerebral hemispheres.  No midline shift.  No new/acute hemorrhage or hematoma.  Interval resorption of essentially all of the subarachnoid blood.  Right frontotemporal  craniotomy with clipping of 2 additional aneurysms on the right.  Visualized paranasal sinuses, mastoid air cells, and middle ear cavities well-aerated.  IMPRESSION:  1.  Redevelopment of hydrocephalus since yesterday. 2.  Residual intraventricular hemorrhage.  Near complete resolution of the subarachnoid hemorrhage. 3.  Increased intracranial pressure suspected, as there is complete loss of the cortical sulci in both cerebral hemispheres. No midline shift.  These results were called by telephone on 03/09/2012  at  [place] hours to  Beverlee Nims, the nurse caring for the patient in the neuro ICU, who verbally acknowledged these results.  Original Report Authenticated By: Deniece Portela, M.D.   Ct Head Wo Contrast  03/08/2012  *RADIOLOGY REPORT*  Clinical Data: Ventriculostomy removal.  Aneurysm coiling. Subarachnoid hemorrhage.  CT HEAD WITHOUT CONTRAST  Technique:  Contiguous axial images were obtained from the base of the skull through the vertex without contrast.  Comparison: 03/07/2011 and multiple previous  Findings: Ventriculostomy previously placed from a right frontal approach has been removed.  Ventricular size is smaller than was seen yesterday.  Intraventricular blood layering dependently in the lateral ventricles and within the third ventricle remains evident. Subarachnoid blood is becoming less dense.  Low density along the previous ventriculostomy tract is seen.  No evidence of hemorrhage. Few tiny particles of plunged bone superficially.  Dense artifact from coils in a basilar tip aneurysm.  Previous aneurysm clips in the right middle cerebral artery and right internal carotid region. No extra-axial collection.  IMPRESSION: Ventriculostomy removed.  Ventricles smaller than seen yesterday. Intraventricular blood remains evident.  No new bleeding.  No interval complication.  Original Report Authenticated By: Jules Schick, M.D.   Dg Chest Port 1 View  03/27/2012  *RADIOLOGY REPORT*  Clinical Data:  Endotracheal tube position  PORTABLE CHEST - 1 VIEW  Comparison: 03/15/2012  Findings: Endotracheal tube terminates 3 cm above the carina.  Mild left basilar opacity, possibly atelectasis.  Lungs are otherwise clear.  No pleural effusion or pneumothorax.  Cardiomediastinal silhouette is within normal limits.  Suspected VP shunt catheter coursing along the right hemithorax and terminating in the right upper abdomen.  IMPRESSION: Endotracheal tube terminates 3 cm above the carina.  Original Report Authenticated By: Julian Hy, M.D.   Dg Chest Port 1 View  03/15/2012  *RADIOLOGY REPORT*  Clinical Data: Extubation.  Recent pulmonary edema.  PORTABLE CHEST - 1 VIEW  Comparison: 03/08/2012  Findings: Chronic cardiomegaly.  Pulmonary vascularity is now within normal limits.  Minimal linear atelectasis at the left base. Probable tiny residual right effusion.  No visible left effusion.  IMPRESSION: Minimal linear atelectasis at the left base.  Probable tiny right effusion. The appearance of the chest is much improved since the prior exam.  Original Report Authenticated By: Larey Seat, M.D.   Dg Chest Port 1 View  03/08/2012  *RADIOLOGY REPORT*  Clinical Data: Edema.  PORTABLE CHEST - 1 VIEW  Comparison: 03/07/2012.  Findings: Heart size stable.  There is diffuse bilateral air space disease, bibasilar predominant.  Probable bilateral pleural effusions.  IMPRESSION: Diffuse bilateral air space disease, bibasilar predominant, stable. Suspect bilateral pleural effusions.  Original Report Authenticated By: Luretha Rued, M.D.   Dg Chest Port 1 View  03/07/2012  *RADIOLOGY REPORT*  Clinical Data: Follow up edema  PORTABLE CHEST -  1 VIEW  Comparison: 03/06/2012  Findings: Diffuse bilateral airspace disease shows mild improvement suggesting improving pulmonary edema.  Bibasilar atelectasis and effusions.  Improved aeration in the right lung base.  Cardiac enlargement  IMPRESSION: Improved aeration.  Partial  clearing of pulmonary edema. Improvement in right lower lobe atelectasis.  Original Report Authenticated By: Truett Perna, M.D.   Dg Chest Port 1 View  03/06/2012  *RADIOLOGY REPORT*  Clinical Data: Shortness of breath.  PORTABLE CHEST - 1 VIEW  Comparison: 03/06/2012 at 5:24 a.m.  Findings: Mildly increasing pulmonary vascular congestion and bilateral airspace opacities/edema noted. Bibasilar atelectasis and question of small bilateral pleural effusions noted. There is no evidence of pneumothorax. No acute bony abnormalities are identified.  IMPRESSION: Increasing bilateral airspace opacities/edema and pulmonary vascular congestion.  Bibasilar atelectasis with question of small bilateral pleural effusions.  Original Report Authenticated By: Lura Em, M.D.   Dg Abd Portable 1v  04/02/2012  *RADIOLOGY REPORT*  Clinical Data: Feeding tube reinsertion.  PORTABLE ABDOMEN - 1 VIEW  Comparison: Chest x-ray 04/02/2012.  Findings: Tip of feeding tube is now in the body of the stomach.  A ventriculoperitoneal shunt tubing is seen projecting over the right upper quadrant of the abdomen.  IMPRESSION: 1.  Tip of feeding tube is now in the body of the stomach.  Original Report Authenticated By: Etheleen Mayhew, M.D.   Dg Abd Portable 1v  04/02/2012  *RADIOLOGY REPORT*  Clinical Data: Feeding tube placement  PORTABLE ABDOMEN - 1 VIEW  Comparison: 03/29/2012  Findings: Feeding tube tip projects over the distal stomach. Nonobstructive bowel gas pattern.  Hemidiaphragms are excluded from the image.  No acute osseous finding.  IMPRESSION: Feeding tube tip projects over the distal stomach.  Original Report Authenticated By: Suanne Marker, M.D.   Dg Abd Portable 1v  03/29/2012  *RADIOLOGY REPORT*  Clinical Data: Evaluate feeding tube placement  PORTABLE ABDOMEN - 1 VIEW  Comparison: 03/29/2012  Findings: Feeding tube is looped within the stomach.  The tip is within the proximal stomach near the GE junction.   There is a ventriculoperitoneal shunt with tubing coiled in the right upper quadrant of the abdomen.  The bowel gas pattern appears nonobstructed.  IMPRESSION:  1.  The feeding tube is coiled within the stomach with tip in the proximal stomach.  Original Report Authenticated By: Angelita Ingles, M.D.   Dg Abd Portable 1v  03/29/2012  *RADIOLOGY REPORT*  Clinical Data: Feeding tube placement.  PORTABLE ABDOMEN - 1 VIEW  Comparison: None.  Findings: Feeding tube projects over the distal descending duodenum.  Shunt catheter terminates the right upper quadrant.  Gas is seen in the colon and stomach.  IMPRESSION: Feeding tube terminates in the descending duodenum.  Original Report Authenticated By: Luretha Rued, M.D.    PHYSICAL EXAM   Patient is oriented. She's not complaining of any pain. There is no jugulovenous distention. Lungs reveal scattered rhonchi. Cardiac exam reveals S1 and S2. There no clicks or significant murmurs. The abdomen is soft. There is no significant peripheral edema.   TELEMETRY: I have personally reviewed telemetry. There is normal sinus rhythm.   ASSESSMENT AND PLAN:   *SAH (subarachnoid hemorrhage)   DIASTOLIC HEART FAILURE, CHRONIC     The patient's input and output are approximately equal yesterday. Heart failure appears to be under control. No change in therapy.   Severe hypertension     Patient is on very substantial antihypertensive medications. I feel the should not be  changed at this time. In our original consult note it was mentioned that further review would be considered to be sure about the patient's renal artery status. I am not sure if this has been done. I do not see any definite notes about this. At some point we will need to clarify whether any further evaluation of the renal arteries is indicated to rule out renal artery stenosis as a participant in her severe hypertension.     This original note was written at 7:30 this morning. I have researched the  issue of renal artery stenosis further as the day has gone on. It is my understanding that there has been a renal artery Doppler done at some time during this hospitalization. At this time I am not able to find this result. It is my understanding however that this Doppler does show renal artery stenosis. I do not know the exact severity. It appears that further workup of this will be very important. However, with the patient's recent subarachnoid bleed, she is not a candidate for the anti-platelet therapy that will be necessary if she were to have a renal artery stent. Therefore this tissue cannot be approached fully at this time. The plan should be that when she is further out from the current bleed in stable, consideration be given to complete workup of renal artery stenosis with possible interventional therapy.  Dola Argyle 04/05/2012 7:36 AM

## 2012-04-05 NOTE — Progress Notes (Signed)
Speech Language Pathology Treatment Patient Details Name: [name] MRN: [id number] DOB: 10-Nov-1960 Today's Date: 04/05/2012 Time: [id number] SLP Time Calculation (min): 29 min  Assessment / Plan / Recommendation Clinical Impression  Pt f/u for cognitive treatment.  Orders received yesterday for repeat swallow assessment.  Pt's most recent swallow eval 6/13 revealed adequate oropharyngeal function for PO diet.  Panda remained in place only to supplement POs, as pt's intake has not historically been good given MS changes/poor initiation.  Panda pulled by pt over weekend. Pt consumed lunch meal today during our session with adequate toleration.  Barriers are due to impaired initiation, abulic state.  Treatment focused on providing max verbal/tactile cues to initiate a motor response (speech and self-feeding) and sustain that response for 15-30 second duration.  Pt responds to "1,2,3, go" cues when given a preparatory set and expectation.  Demonstrating more frequent occasions when she is acting upon environment, requesting food for example.  Recommend continue SLP for cognition.    Please continue Dysphagia 3 diet with thin liquids per 6/13 recs with full supervision and prompts during meals.      SLP Plan  Continue with current plan of care for cognitino ;no formal repeat swallow eval warranted.   Pertinent Vitals/Pain No pain  SLP Goals  SLP Goals Potential to Achieve Goals: Good SLP Goal #1: Pt will focus and sustain attention to functional task in nondistracting environment for 2 minutes with mod assist. SLP Goal #1 - Progress: Progressing toward goal SLP Goal #2: Pt will demonstrate improved verbal/physical response time in <15 seconds with mod assist. SLP Goal #2 - Progress: Progressing toward goal SLP Goal #3: Pt will utilize environmental cues to facilitate orientation to surroundings/time with mod assist. SLP Goal #3 - Progress: Progressing toward goal   Treatment Treatment  focused on: Cognition  [name], [place] CCC/SLP Pager [phone]   [name] 04/05/2012, 2:09 PM

## 2012-04-05 NOTE — Progress Notes (Signed)
Rehab admissions - Continuing to follow.  Can potentially admit to inpatient rehab today if okay with neurosurgery.  Bed available on inpatient rehab today.  Call me for questions.  [id number]

## 2012-04-06 DIAGNOSIS — I6200 Nontraumatic subdural hemorrhage, unspecified: Secondary | ICD-10-CM | POA: Insufficient documentation

## 2012-04-06 LAB — COMPREHENSIVE METABOLIC PANEL
Albumin: 3 g/dL — ABNORMAL LOW (ref 3.5–5.2)
BUN: 19 mg/dL (ref 6–23)
Calcium: 9.8 mg/dL (ref 8.4–10.5)
Creatinine, Ser: 1.07 mg/dL (ref 0.50–1.10)
Total Protein: 6.7 g/dL (ref 6.0–8.3)

## 2012-04-06 LAB — CBC
HCT: 31.8 % — ABNORMAL LOW (ref 36.0–46.0)
MCHC: 32.7 g/dL (ref 30.0–36.0)
MCV: 87.4 fL (ref 78.0–100.0)
RDW: 14 % (ref 11.5–15.5)

## 2012-04-06 LAB — DIFFERENTIAL
Lymphs Abs: 1.6 10*3/uL (ref 0.7–4.0)
Monocytes Relative: 6 % (ref 3–12)
Neutro Abs: 4.5 10*3/uL (ref 1.7–7.7)
Neutrophils Relative %: 67 % (ref 43–77)

## 2012-04-06 MED ORDER — ENSURE COMPLETE PO LIQD
237.0000 mL | Freq: Two times a day (BID) | ORAL | Status: DC
  Administered 2012-04-06 – 2012-04-08 (×4): 237 mL via ORAL

## 2012-04-06 MED ORDER — LEVETIRACETAM 500 MG PO TABS
500.0000 mg | ORAL_TABLET | Freq: Two times a day (BID) | ORAL | Status: DC
  Administered 2012-04-06 – 2012-04-11 (×9): 500 mg via ORAL
  Filled 2012-04-06 (×14): qty 1

## 2012-04-06 MED ORDER — ADULT MULTIVITAMIN W/MINERALS CH
1.0000 | ORAL_TABLET | Freq: Every day | ORAL | Status: DC
  Administered 2012-04-06 – 2012-04-09 (×4): 1 via ORAL
  Filled 2012-04-06 (×8): qty 1

## 2012-04-06 NOTE — Progress Notes (Signed)
Patient noted with poor intake at dinner . Patient will drink chocolate ensure . And consumed 1 this shift . Patient 's blood pressure 100/60  And 1400 dose clonidine and hydralazine given at later time . Patient continues to require cues to complete tasks. Staff not feeding self and only eats small amount when staff feeds patient. Continue with plan of care.                                          [name]

## 2012-04-06 NOTE — Evaluation (Signed)
Physical Therapy Assessment and Plan  Patient Details  Name: [name] MRN: [id number] Date of Birth: April 02, 1961  PT Diagnosis: Abnormal posture, Cognitive deficits, Difficulty walking, Impaired cognition and Muscle weakness Rehab Potential: Fair ELOS: 3-4 weeks   Today's Date: 04/06/2012 Time: 1030-1127 Time Calculation (min): 57 min  Problem List:  Patient Active Problem List  Diagnosis  . CAD, NATIVE VESSEL  . DIASTOLIC HEART FAILURE, CHRONIC  . CHRONIC KIDNEY DISEASE UNSPECIFIED  . SAH (subarachnoid hemorrhage)  . Acute respiratory failure  . Acidosis  . Severe hypertension  . Protein calorie malnutrition  . Renal artery stenosis    Past Medical History:  Past Medical History  Diagnosis Date  . HTN (hypertension)     resistant  . Hypercholesterolemia   . Tobacco abuse     resumed smoking half a pack a day. She was a smoker in the past and states she was abl eto stop in the past using nicotine patches  . CAD (coronary artery disease)     Pt has St elevation MI in Feb 2009. Left hear tcath at tha ttime showed a 70% distal LAD lesion that was hazy consistent with a plaque rupture. She had a 50% distal circumflex stenosis an a 95% distal RCA stenosis. She did havve drug eluting stent placed in her distal RCA  . Chronic kidney disease     most recent creatinine 1.4 in 3/10  . Intracranial hemorrhage, spontaneous intraparenchymal, idiopathic, remote, resolved   . Diastolic heart failure     pt most recent echo was in Feb 2009 w an EF of 60% and severe left ventricular hypertrophy. The pt did have evidence of diastolic dysfunction. The RV appeared normal  . Subarachnoid hemorrhage     03/2012   Past Surgical History:  Past Surgical History  Procedure Date  . Ventriculoperitoneal shunt 03/27/2012    Procedure: SHUNT INSERTION VENTRICULAR-PERITONEAL;  Surgeon: Winfield Cunas, MD;  Location: [facility];  Service: Neurosurgery;  Laterality: N/A;  Ventricular-Peritoneal  Shunt Insertion    Assessment & Plan Clinical Impression: 51 y.o. right-handed female with previous intraparenchymal hemorrhage in the past admitted 03/03/2012 with headache, nausea and vomiting. CT of the head showed diffuse subarachnoid hemorrhage in the basilar cisterns, sylvian fissures, along the tentorium and anterior falx. Followup neurosurgery Dr. Cyndy Freeze. Patient had been intubated and placement of ventriculostomy catheter. Patient was later extubated on 03/04/2012 Hospital course with obstructive hydrocephalus and ultimately underwent shunt insertion ventriculoperitoneal right side on 03/27/2012. Patient did again require intubation due to respiratory difficulties and again extubated 03/29/2012. Latest followup CT scan showed satisfactory appearance status post VP shunting. Maintained on Keppra for seizure prophylaxis. Patient is currently tolerating a dysphagia 3 thin liquid diet. Blood pressures have been difficult to manage with followup per cardiology services as well as critical care medicine with question plan to check renal arteries for any stenosis that could be affecting her hypertension. Patient transferred to CIR on 04/05/2012 .   Patient currently requires total to max assist with mobility secondary to muscle weakness, impaired timing and sequencing, unbalanced muscle activation and decreased motor planning, decreased initiation, decreased attention, decreased awareness, decreased problem solving, decreased safety awareness, decreased memory and delayed processing and decreased sitting balance, decreased standing balance and decreased balance strategies.  Prior to hospitalization, patient was independent with mobility and lived with Daughter in a House home.  Home access is 4Stairs to enter.  Pt currently has significant difficulty initiating motor function. Pt unable to negotiate wheelchair and  directed chair at tables and wall, unable to turn chair to avoid obstacles. Uncertain if pt  aware that they were obstacles. Pt's main limiting factor being impaired cognition. Anticipate pt will definitely need 24 hour supervision upon return home. Patient will benefit from skilled PT intervention to maximize safe functional mobility, minimize fall risk and decrease caregiver burden for planned discharge home with 24 hour supervision.  Anticipate patient will benefit from follow up [place] at discharge.  PT - End of Session Endurance Deficit: Yes Endurance Deficit Description: Pt constantly requests to return to bed for rest.  PT Assessment Rehab Potential: Fair Barriers to Discharge: Decreased caregiver support PT Plan PT Frequency: 2-3 X/day, 60-90 minutes Estimated Length of Stay: 3-4 weeks PT Treatment/Interventions: Ambulation/gait training;Balance/vestibular training;Cognitive remediation/compensation;Discharge planning;DME/adaptive equipment instruction;Functional mobility training;Neuromuscular re-education;Patient/family education;UE/LE Strength taining/ROM;Therapeutic Activities;Therapeutic Exercise;Wheelchair propulsion/positioning PT Recommendation Follow Up Recommendations: Home health PT;24 hour supervision/assistance Equipment Recommended: Rolling walker with 5" wheels;Other (comment) (Pt may need wheelchair pending progress. )  PT Evaluation Precautions/Restrictions Precautions Precautions: Fall Restrictions Weight Bearing Restrictions: No  Vital Signs Therapy Vitals BP: 180/92 mmHg Pain Pain Assessment Pain Assessment: No/denies pain Pain Score: 10-Worst pain ever Pain Type: Acute pain Pain Location: Neck Pain Orientation: Posterior;Right Pain Descriptors: Aching Pain Frequency: Occasional Pain Onset: On-going Patients Stated Pain Goal: 2 Pain Intervention(s): Medication (See eMAR) (tylenol 650mg  ) Home Living/Prior Functioning Home Living Lives With: Daughter Available Help at Discharge: Family;Available 24 hours/day (per chart review family reports 24  hour assist is available) Type of Home: House Home Access: Stairs to enter [employer] of Steps: 4 Entrance Stairs-Rails: Can reach both Home Layout: One level Bathroom Shower/Tub: Tub/shower unit;Door Home Adaptive Equipment:  (pt reports she has a RW. ) Additional Comments: Family not available to confirm living environment, uncertain of accuracy given by pt.  Prior Function Level of Independence:  (pt reports independent with ADLs) Able to Take Stairs?: Yes Driving: Yes Vocation: On disability Comments: Pt reports she liked to sit in her house and watch TV.  Vision/Perception  Vision - History Patient Visual Report: No change from baseline Vision - Assessment Eye Alignment: Within Functional Limits Perception Perception: Within Functional Limits Praxis Praxis: Intact  Cognition Overall Cognitive Status: Impaired at baseline Arousal/Alertness: Lethargic Orientation Level: Oriented to person;Oriented to situation;Disoriented to place;Disoriented to time (knew Obama was president) Attention: Sustained Focused Attention: Appears intact Sustained Attention: Impaired Sustained Attention Impairment: Verbal basic;Functional basic Memory: Impaired Memory Impairment: Decreased long term memory;Decreased recall of new information;Decreased short term memory;Storage deficit;Retrieval deficit Decreased Long Term Memory: Verbal basic;Functional basic Decreased Short Term Memory: Verbal basic;Functional basic Awareness: Impaired Awareness Impairment: Intellectual impairment Problem Solving: Impaired Problem Solving Impairment: Verbal basic;Functional basic Executive Function: Reasoning;Sequencing;Organizing;Decision Making;Initiating;Self Monitoring;Self Correcting Reasoning: Impaired Reasoning Impairment: Verbal basic;Functional basic Sequencing: Impaired Sequencing Impairment: Verbal basic;Functional basic Organizing: Impaired Organizing Impairment: Verbal  basic;Functional basic Decision Making: Impaired Decision Making Impairment: Verbal basic;Functional basic Initiating: Impaired Initiating Impairment: Verbal basic;Functional basic Self Monitoring: Impaired Self Monitoring Impairment: Verbal basic;Functional basic Self Correcting: Impaired Self Correcting Impairment: Verbal basic;Functional basic Behaviors: Perseveration Safety/Judgment: Impaired Sensation Sensation Light Touch: Impaired Detail Additional Comments: Pt able to discriminate light touch bilaterally, however became distracted and was unable to determine that both legs were being touched at the same time. Coordination Gross Motor Movements are Fluid and Coordinated: Yes Fine Motor Movements are Fluid and Coordinated: Yes Motor  Motor Motor: Abnormal postural alignment and control;Motor impersistence Motor - Skilled Clinical Observations: generalized weakness, mobility challenged by cognition  Mobility  Bed Mobility Bed Mobility: Rolling Right;Right Sidelying to Sit;Rolling Left;Sit to Supine Rolling Right: 3: Mod assist Rolling Right Details: Tactile cues for placement;Tactile cues for initiation;Tactile cues for sequencing;Verbal cues for sequencing;Verbal cues for technique Rolling Left: 3: Mod assist Rolling Left Details: Tactile cues for placement;Tactile cues for initiation;Tactile cues for sequencing;Verbal cues for sequencing;Visual cues/gestures for sequencing;Verbal cues for technique Right Sidelying to Sit: 1: +2 Total assist Right Sidelying to Sit: Patient Percentage: 50% Right Sidelying to Sit Details: Tactile cues for sequencing;Tactile cues for placement;Tactile cues for initiation;Verbal cues for technique;Verbal cues for sequencing Right Sidelying to Sit Details (indicate cue type and reason): Pt felt she was having discomfort in Rt. thigh and would not initiate movement. Pt with difficulty processing task, requires max verbal cues to perform.  Supine to  Sit: 3: Mod assist Sitting - Scoot to Edge of Bed: 3: Mod assist Sitting - Scoot to Edge of Bed Details: Tactile cues for placement;Tactile cues for sequencing;Tactile cues for weight shifting;Visual cues/gestures for precautions/safety;Verbal cues for technique Sitting - Scoot to Edge of Bed Details (indicate cue type and reason): Max verbal cues for initiation, tactile cues for sequence. increased time to perform task. Sit to Supine: 4: Min assist Sit to Supine - Details (indicate cue type and reason): assist for bil. LEs, max verbal cues for step by step sequencing.  Transfers Sit to Stand: 2: Max assist Sit to Stand Details: Tactile cues for initiation;Tactile cues for placement;Tactile cues for sequencing;Verbal cues for sequencing;Verbal cues for technique Sit to Stand Details (indicate cue type and reason): Max assist without device. Step by step cues for initiation, safety, and sequencing. Performed 4 x total, last 2 with RW in front, pt able to perform with moderate assist with RW.  Stand to Sit: 3: Mod assist;2: Max assist Stand to Sit Details (indicate cue type and reason): Tactile cues for initiation;Tactile cues for sequencing;Tactile cues for placement;Verbal cues for safe use of DME/AE;Verbal cues for technique;Verbal cues for precautions/safety Stand to Sit Details: Pt fluctuating between moderate and max assist. Pt not following commands for safe UE placement. Decreased control of  descent.  Stand Pivot Transfers: 2: Max assist Stand Pivot Transfer Details: Tactile cues for placement;Tactile cues for initiation;Tactile cues for sequencing;Verbal cues for technique;Verbal cues for sequencing;Verbal cues for precautions/safety;Verbal cues for safe use of DME/AE Stand Pivot Transfer Details (indicate cue type and reason): Without device pt requires max assist, with RW pt able to progress to moderate assist with practice.  Locomotion  Ambulation Ambulation: No Ambulation/Gait  Assistance: Not tested (comment) (pt refusing ambulation this session secondary to fatigue. ) Gait Gait: No Wheelchair Mobility Distance: 15'  Trunk/Postural Assessment  Cervical Assessment Cervical Assessment: Exceptions to [facility] (forward head posture) Thoracic Assessment Thoracic Assessment: Within Functional Limits Lumbar Assessment Lumbar Assessment: Within Functional Limits Postural Control Postural Control: Deficits on evaluation Righting Reactions: delayed righting reacitons noted. Pt needs continuous supervision.   Balance Balance Balance Assessed: Yes Static Sitting Balance Static Sitting - Balance Support: Feet supported;Bilateral upper extremity supported Static Sitting - Level of Assistance: 4: Min assist Static Sitting - Comment/# of Minutes: Up to min assist on mat and bed as pt has decreased righting reactions.  Static Standing Balance Static Standing - Balance Support: No upper extremity supported Static Standing - Level of Assistance: 1: +1 Total assist Static Standing - Comment/# of Minutes: Pt only able to tolerate ~30 sec of static standing prior to requesting to sit.  Extremity Assessment  RUE Assessment RUE  Assessment: Within Functional Limits LUE Assessment LUE Assessment: Within Functional Limits RLE Assessment RLE Assessment: Exceptions to [facility] RLE AROM (degrees) RLE Overall AROM Comments: WFL RLE Strength RLE Overall Strength Comments: Pt symmetrical Lt. to Rt. with MMT, grossly 4-/5 however not following commands with some testing such as hamstrings (unable to rate strength).  LLE Assessment LLE Assessment: Exceptions to WFL LLE AROM (degrees) LLE Overall AROM Comments: WFL LLE Strength LLE Overall Strength Comments: Pt symmetrical Lt. to Rt. with MMT, grossly 4-/5 however not following commands with some testing such as hamstrings (unable to rate strength).   See FIM for current functional status Refer to Care Plan for Long Term  Goals  Recommendations for other services: None  Discharge Criteria: Patient will be discharged from PT if patient refuses treatment 3 consecutive times without medical reason, if treatment goals not met, if there is a change in medical status, if patient makes no progress towards goals or if patient is discharged from hospital.  The above assessment, treatment plan, treatment alternatives and goals were discussed and mutually agreed upon: by patient (uncertain of retention, family unavailable)   Lahoma Rocker 04/06/2012, 12:03 PM

## 2012-04-06 NOTE — Interval H&P Note (Signed)
[name] was admitted today to Inpatient Rehabilitation with the diagnosis of ICH with hydrocephalus.  The patient's history has been reviewed, patient examined, and there is no change in status.  Patient continues to be appropriate for intensive inpatient rehabilitation.  I have reviewed the patient's chart and labs.  Questions were answered to the patient's satisfaction.  This patient was seen and examined yesterday. H &P was completed yesterday. I'm transferring it to the rehab chart for the purpose of charting/continuity  [name] T 04/06/2012, 7:06 AM

## 2012-04-06 NOTE — Evaluation (Signed)
Occupational Therapy Assessment and Plan  Patient Details  Name: [name] MRN: [id number] Date of Birth: November 23, 1960  OT Diagnosis: acute pain, cognitive deficits and muscle weakness (generalized) Rehab Potential: Rehab Potential: Good ELOS: 3-4 weeks   Today's Date: 04/06/2012 Time: [id number] Time Calculation (min): 60 min  Problem List:  Patient Active Problem List  Diagnosis  . CAD, NATIVE VESSEL  . DIASTOLIC HEART FAILURE, CHRONIC  . CHRONIC KIDNEY DISEASE UNSPECIFIED  . SAH (subarachnoid hemorrhage)  . Acute respiratory failure  . Acidosis  . Severe hypertension  . Protein calorie malnutrition  . Renal artery stenosis    Past Medical History:  Past Medical History  Diagnosis Date  . HTN (hypertension)     resistant  . Hypercholesterolemia   . Tobacco abuse     resumed smoking half a pack a day. She was a smoker in the past and states she was abl eto stop in the past using nicotine patches  . CAD (coronary artery disease)     Pt has St elevation MI in Feb 2009. Left hear tcath at tha ttime showed a 70% distal LAD lesion that was hazy consistent with a plaque rupture. She had a 50% distal circumflex stenosis an a 95% distal RCA stenosis. She did havve drug eluting stent placed in her distal RCA  . Chronic kidney disease     most recent creatinine 1.4 in 3/10  . Intracranial hemorrhage, spontaneous intraparenchymal, idiopathic, remote, resolved   . Diastolic heart failure     pt most recent echo was in Feb 2009 w an EF of 60% and severe left ventricular hypertrophy. The pt did have evidence of diastolic dysfunction. The RV appeared normal  . Subarachnoid hemorrhage     03/2012   Past Surgical History:  Past Surgical History  Procedure Date  . Ventriculoperitoneal shunt 03/27/2012    Procedure: SHUNT INSERTION VENTRICULAR-PERITONEAL;  Surgeon: Winfield Cunas, MD;  Location: [facility];  Service: Neurosurgery;  Laterality: N/A;  Ventricular-Peritoneal Shunt Insertion     Assessment & Plan Clinical Impression: Patient is a 51 y.o. year old female with previous intraparenchymal hemorrhage in the past admitted 03/03/2012 with headache, nausea and vomiting. CT of the head showed diffuse subarachnoid hemorrhage in the basilar cisterns, sylvian fissures, along the tentorium and anterior falx. Followup neurosurgery Dr. Cyndy Freeze. Patient had been intubated and placement of ventriculostomy catheter. Patient was later extubated on 03/04/2012 Hospital course with obstructive hydrocephalus and ultimately underwent shunt insertion ventriculoperitoneal right side on 03/27/2012. Patient did again require intubation due to respiratory difficulties and again extubated 03/29/2012. Latest followup CT scan showed satisfactory appearance status post VP shunting. Maintained on Keppra for seizure prophylaxis. Patient is currently tolerating a dysphagia 3 thin liquid diet. Blood pressures have been difficult to manage with followup per cardiology services as well as critical care medicine with question plan to check renal arteries for any stenosis that could be affecting her hypertension. Physical therapy evaluation occupational therapy completed 03/30/2012 with recommendations for physical medicine rehabilitation consult to consider inpatient rehabilitation services  .  Patient transferred to CIR on 04/05/2012 .    Patient currently requires total with basic self-care skills secondary to muscle weakness, impaired timing and sequencing, decreased initiation, decreased attention, decreased awareness, decreased problem solving, decreased safety awareness, decreased memory and delayed processing and decreased standing balance, decreased postural control and decreased balance strategies, decreased working memory, and safety.  Prior to hospitalization, patient could complete ADLs with independence.  Patient will benefit from skilled  intervention to decrease level of assist with basic self-care skills  and increase independence with basic self-care skills prior to discharge home with care partner.  Anticipate patient will require 24 hour supervision and minimal physical assistance and follow up home health.  OT - End of Session Activity Tolerance: Tolerates 10 - 20 min activity with multiple rests Endurance Deficit: Yes OT Assessment Rehab Potential: Good OT Plan OT Frequency: 1-2 X/day, 60-90 minutes Estimated Length of Stay: 3-4 weeks OT Treatment/Interventions: Balance/vestibular training;Community reintegration;Discharge planning;Cognitive remediation/compensation;DME/adaptive equipment instruction;Functional mobility training;Neuromuscular re-education;Psychosocial support;Therapeutic Exercise;UE/LE Coordination activities;UE/LE Strength taining/ROM;Splinting/orthotics;Pain management;Patient/family education;Self Care/advanced ADL retraining;Therapeutic Activities;Visual/perceptual remediation/compensation OT Recommendation Follow Up Recommendations: Home health OT;24 hour supervision/assistance  OT Evaluation Precautions/Restrictions  Precautions Precautions: Fall Restrictions Weight Bearing Restrictions: No General Chart Reviewed: Yes Family/Caregiver Present: No Vital Signs Therapy Vitals BP: 180/92 mmHg Pain Pain Assessment Pain Assessment: No/denies pain Pain Score: 10-Worst pain ever Pain Type: Acute pain Pain Location: Neck Pain Orientation: Posterior;Right Pain Descriptors: Aching Pain Frequency: Occasional Pain Onset: On-going Patients Stated Pain Goal: 2 Pain Intervention(s): Medication (See eMAR) (tylenol 650mg  ) Home Living/Prior Functioning Home Living Lives With: Daughter Available Help at Discharge: Family;Available 24 hours/day (per RN, family reports 24 hour assist is available) Type of Home: House Home Access: Stairs to enter [employer] of Steps: 4 Entrance Stairs-Rails: Can reach both Home Layout: One level Bathroom Shower/Tub:  Tub/shower unit;Door Home Adaptive Equipment:  (pt reports she has a RW. ) Additional Comments: Family not available to confirm living environment Prior Function Able to Take Stairs?: Yes Driving: Yes Vocation: On disability Comments: Independent with ADLs, watched TV.  ADL   Vision/Perception  Vision - History Patient Visual Report: No change from baseline Vision - Assessment Eye Alignment: Within Functional Limits Perception Perception: Within Functional Limits Praxis Praxis: Intact  Cognition Overall Cognitive Status: Impaired at baseline Arousal/Alertness: Lethargic Orientation Level: Oriented to person;Disoriented to place;Disoriented to time;Oriented to situation (oriented to name, not age) Attention: Sustained Focused Attention: Appears intact Sustained Attention: Impaired Sustained Attention Impairment: Verbal basic;Functional basic Memory: Impaired Memory Impairment: Decreased long term memory;Decreased recall of new information;Decreased short term memory;Storage deficit;Retrieval deficit Decreased Long Term Memory: Verbal basic;Functional basic Decreased Short Term Memory: Verbal basic;Functional basic Awareness: Impaired Awareness Impairment: Intellectual impairment Problem Solving: Impaired Problem Solving Impairment: Verbal basic;Functional basic Executive Function: Reasoning;Sequencing;Organizing;Decision Making;Initiating;Self Monitoring;Self Correcting Reasoning: Impaired Reasoning Impairment: Verbal basic;Functional basic Sequencing: Impaired Sequencing Impairment: Verbal basic;Functional basic Organizing: Impaired Organizing Impairment: Verbal basic;Functional basic Decision Making: Impaired Decision Making Impairment: Verbal basic;Functional basic Initiating: Impaired Initiating Impairment: Verbal basic;Functional basic Self Monitoring: Impaired Self Monitoring Impairment: Verbal basic;Functional basic Self Correcting: Impaired Self Correcting  Impairment: Verbal basic;Functional basic Behaviors: Perseveration Safety/Judgment: Impaired Sensation Sensation Light Touch: Impaired Detail Additional Comments: Pt able to discriminate light touch bilaterally, however became distracted.  Coordination Gross Motor Movements are Fluid and Coordinated: Yes Fine Motor Movements are Fluid and Coordinated: Yes Motor  Motor Motor: Abnormal postural alignment and control Motor - Skilled Clinical Observations: generalized weakness, mobility challenged by cognition Mobility  Bed Mobility Supine to Sit: 3: Mod assist Sitting - Scoot to Edge of Bed: 2: Max assist  Trunk/Postural Assessment  Cervical Assessment Cervical Assessment: Within Functional Limits Thoracic Assessment Thoracic Assessment: Within Functional Limits Lumbar Assessment Lumbar Assessment: Within Functional Limits Postural Control Postural Control: Deficits on evaluation (posterior lean) Righting Reactions: delayed  Balance Balance Balance Assessed: Yes Static Sitting Balance Static Sitting - Balance Support: Feet supported;Bilateral upper extremity supported Static Sitting - Level of Assistance:  4: Min Insurance risk surveyor Standing - Balance Support: During functional activity;Bilateral upper extremity supported Static Standing - Level of Assistance: 1: +1 Total assist Extremity/Trunk Assessment RUE Assessment RUE Assessment: Within Functional Limits LUE Assessment LUE Assessment: Within Functional Limits  See FIM for current functional status Refer to Care Plan for Long Term Goals  Recommendations for other services: None  Discharge Criteria: Patient will be discharged from OT if patient refuses treatment 3 consecutive times without medical reason, if treatment goals not met, if there is a change in medical status, if patient makes no progress towards goals or if patient is discharged from hospital.  The above assessment, treatment plan,  treatment alternatives and goals were discussed and mutually agreed upon: by patient  Nicoletta Ba 04/06/2012, 11:24 AM

## 2012-04-06 NOTE — Progress Notes (Signed)
Patient ID: [name], female   DOB: 1961/05/02, 51 y.o.   MRN: [id number] Subjective/Complaints: No problems over night. Slow to arouse this am.  Ros otherwise negative or unobtainable this am Objective: Vital Signs: Blood pressure 148/84, pulse 71, temperature 97.9 F (36.6 C), temperature source Oral, resp. rate 18, height 5\' 1"  (1.549 m), weight 73.7 kg (162 lb 7.7 oz), SpO2 100.00%. No results found.  Basename 04/06/12 0500  WBC 6.8  HGB 10.4*  HCT 31.8*  PLT 256    Basename 04/05/12 0423 04/04/12 0725  NA 133* 136  K 5.0 4.9  CL 98 100  CO2 25 24  GLUCOSE 111* 103*  BUN 20 21  CREATININE 1.12* 1.09  CALCIUM 9.7 9.7   CBG (last 3)  No results found for this basename: GLUCAP:3 in the last 72 hours  Wt Readings from Last 3 Encounters:  04/05/12 73.7 kg (162 lb 7.7 oz)  04/05/12 73.4 kg (161 lb 13.1 oz)  04/05/12 73.4 kg (161 lb 13.1 oz)    Physical Exam:  General appearance: no distress and slowed mentation Head: Normocephalic, without obvious abnormality, atraumatic Eyes: conjunctivae/corneas clear. PERRL, EOM's intact. Fundi benign. Ears: normal TM's and external ear canals both ears Nose: Nares normal. Septum midline. Mucosa normal. No drainage or sinus tenderness. Throat: lips, mucosa, and tongue normal; teeth and gums normal Neck: no adenopathy, no carotid bruit, no JVD, supple, symmetrical, trachea midline and thyroid not enlarged, symmetric, no tenderness/mass/nodules Back: symmetric, no curvature. ROM normal. No CVA tenderness. Resp: clear to auscultation bilaterally Cardio: regular rate and rhythm, S1, S2 normal, no murmur, click, rub or gallop GI: soft, non-tender; bowel sounds normal; no masses,  no organomegaly Extremities: extremities normal, atraumatic, no cyanosis or edema Pulses: 2+ and symmetric Skin: Skin color, texture, turgor normal. No rashes or lesions Neurologic: slow to arouse. Moves right more than left. Withdraws to  pain Incision/Wound: incision clean with staples, no drainage   Assessment/Plan: 1. Functional deficits secondary to [facility] with hydrocephalus s/p VP shunt which require 3+ hours per day of interdisciplinary therapy in a comprehensive inpatient rehab setting. Physiatrist is providing close team supervision and 24 hour management of active medical problems listed below. Physiatrist and rehab team continue to assess barriers to discharge/monitor patient progress toward functional and medical goals. FIM:                   Comprehension Comprehension Mode: Auditory Comprehension: 2-Understands basic 25 - 49% of the time/requires cueing 51 - 75% of the time  Expression Expression Mode: Verbal Expression: 2-Expresses basic 25 - 49% of the time/requires cueing 50 - 75% of the time. Uses single words/gestures.  Social Interaction Social Interaction: 2-Interacts appropriately 25 - 49% of time - Needs frequent redirection.  Problem Solving Problem Solving: 1-Solves basic less than 25% of the time - needs direction nearly all the time or does not effectively solve problems and may need a restraint for safety  Memory Memory: 1-Recognizes or recalls less than 25% of the time/requires cueing greater than 75% of the time  1. Subarachnoid hemorrhage status post VP shunting 03/27/2012  2. DVT Prophylaxis/Anticoagulation: SCDs. Monitor for DVT  3. Seizure prophylaxis. Keppra 500 mg every 12 hours. Monitor for any seizure activity  4. Dysphasia. Diet been advanced to mechanical soft thin liquids. Monitor for any signs of aspiration  5. Hypertension. Norvasc 10 mg daily, clonidine 0.3 mg every 8 hours, hydralazine 100 mg every 8 hours, labetalol 400 mg 3 times a day,  Cozaar 25 mg twice a day and Aldactone 25 mg daily  -bp has been poorly controlled   - Discuss with cardiology services plan to check renal arteries for any stenosis.  6. Arousal: maximize sleep wake cycle. Probably would benefit  from a stimulant but i don't want to exacerbate her HTN any further. Will consider moving forward based on arousal levels and BP. i will discuss with team. 7. Hyponatremia- follow serially  LOS (Days) 1 A FACE TO FACE EVALUATION WAS PERFORMED  [name] T 04/06/2012, 7:10 AM

## 2012-04-06 NOTE — Progress Notes (Signed)
Social Work Assessment and Plan Social Work Assessment and Plan  Patient Details  Name: [name] MRN: [id number] Date of Birth: [birth year]  Today's Date: 04/06/2012  Problem List:  Patient Active Problem List  Diagnosis  . CAD, NATIVE VESSEL  . DIASTOLIC HEART FAILURE, CHRONIC  . CHRONIC KIDNEY DISEASE UNSPECIFIED  . SAH (subarachnoid hemorrhage)  . Acute respiratory failure  . Acidosis  . Severe hypertension  . Protein calorie malnutrition  . Renal artery stenosis  . Subdural hemorrhage   Past Medical History:  Past Medical History  Diagnosis Date  . HTN (hypertension)     resistant  . Hypercholesterolemia   . Tobacco abuse     resumed smoking half a pack a day. She was a smoker in the past and states she was abl eto stop in the past using nicotine patches  . CAD (coronary artery disease)     Pt has St elevation MI in Feb 2009. Left hear tcath at tha ttime showed a 70% distal LAD lesion that was hazy consistent with a plaque rupture. She had a 50% distal circumflex stenosis an a 95% distal RCA stenosis. She did havve drug eluting stent placed in her distal RCA  . Chronic kidney disease     most recent creatinine 1.4 in 3/10  . Intracranial hemorrhage, spontaneous intraparenchymal, idiopathic, remote, resolved   . Diastolic heart failure     pt most recent echo was in Feb 2009 w an EF of 60% and severe left ventricular hypertrophy. The pt did have evidence of diastolic dysfunction. The RV appeared normal  . Subarachnoid hemorrhage     03/2012   Past Surgical History:  Past Surgical History  Procedure Date  . Ventriculoperitoneal shunt 03/27/2012    Procedure: SHUNT INSERTION VENTRICULAR-PERITONEAL;  Surgeon: Winfield Cunas, MD;  Location: [facility];  Service: Neurosurgery;  Laterality: N/A;  Ventricular-Peritoneal Shunt Insertion   Social History:  reports that she has been smoking.  She does not have any smokeless tobacco history on file. She reports that she does  not drink alcohol or use illicit drugs.  Family / Support Systems Marital Status: Single Patient Roles: Parent Children: [name]-daughter [id number] Other Supports: [name] -sister [phone] Anticipated Caregiver: [name] Ability/Limitations of Caregiver: Dtr is not working and can provide care-sister is also committed to pt Caregiver Availability: 24/7 Family Dynamics: Pt's family is small but very supportive and and committed to her.  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 Daughter will need some education regarding pt's condition and diagnosis. Recent Psychosocial Issues: other medical issues-but has managed well Pyschiatric History: No hisotry-deferred depression Screen due to lethargy and inability to participate in interview.  Will continue to monitor and assess when appropriate. Substance Abuse History: Tobacco smoked prior to admission.  Daughter reports needs to stop.  Patient / Family Perceptions, Expectations & Goals Pt/Family understanding of illness & functional  limitations: Daughter and sister can explain her reason for being here in the hospital, but lack understanding and need education regarding eating when not lethargic and treatment  process.  Have encouraged them to coem in and attend therapies with pt.  They will eventually. Premorbid pt/family roles/activities: Mother, Sister, grandmother, Retiree, etc Anticipated changes in roles/activities/participation: Resume once able Pt/family expectations/goals: Daughter states: " I hope she will get like she was before, she could do everything."  Sister:" She will get there, she's tough."  [employer]: None Premorbid Home Care/DME Agencies: None Transportation available at discharge: Family Resource referrals recommended: Support group (specify)  Discharge Planning Living Arrangements: [place]: Children;Other relatives;Friends/neighbors;Church/faith community Type of Residence: Private residence Insurance Resources: [employer] (specify);Medicaid (specify county) (Todays Options   [place]Medicaid[place] Financial Resources: SSD Financial Screen Referred: No Living Expenses: Lives with family Money Management: Patient Do you have any problems obtaining your medications?: No Home Management: Pt and daughter Patient/Family Preliminary Plans: Return home with duahgter who can provide assistance, sister also available to help.  Sister lives very close to them  They plan to come and observe in therapies, which would be beneficial. Social Work Anticipated Follow Up Needs: HH/OP;Support Group DC Planning Additional Notes/Comments: Pt has a long way to go and will require 24 hour care at discharge, tried to prepare daughter of this.  Clinical Impression Very lethargic female lying in bed trying to be aroused by sister and daughter with little success.  They wanted her to eat the chinese food they brought.  Daughter quite confident pt will progress to the level she was prior to Admission.  We are hopeful but wait and see.  Assess pt when more alert.  [name] 04/06/2012, 3:58 PM

## 2012-04-06 NOTE — Evaluation (Signed)
Speech Language Pathology Assessment and Plan  Patient Details  Name: [name] MRN: [id number] Date of Birth: Aug 02, 1961  SLP Diagnosis: Cognitive Impairments  Rehab Potential: Good ELOS: 3-4 weeks   Today's Date: 04/06/2012 Time: [id number] Time Calculation (min): 60 min  Problem List:  Patient Active Problem List  Diagnosis  . CAD, NATIVE VESSEL  . DIASTOLIC HEART FAILURE, CHRONIC  . CHRONIC KIDNEY DISEASE UNSPECIFIED  . SAH (subarachnoid hemorrhage)  . Acute respiratory failure  . Acidosis  . Severe hypertension  . Protein calorie malnutrition  . Renal artery stenosis   Past Medical History:  Past Medical History  Diagnosis Date  . HTN (hypertension)     resistant  . Hypercholesterolemia   . Tobacco abuse     resumed smoking half a pack a day. She was a smoker in the past and states she was abl eto stop in the past using nicotine patches  . CAD (coronary artery disease)     Pt has St elevation MI in Feb 2009. Left hear tcath at tha ttime showed a 70% distal LAD lesion that was hazy consistent with a plaque rupture. She had a 50% distal circumflex stenosis an a 95% distal RCA stenosis. She did havve drug eluting stent placed in her distal RCA  . Chronic kidney disease     most recent creatinine 1.4 in 3/10  . Intracranial hemorrhage, spontaneous intraparenchymal, idiopathic, remote, resolved   . Diastolic heart failure     pt most recent echo was in Feb 2009 w an EF of 60% and severe left ventricular hypertrophy. The pt did have evidence of diastolic dysfunction. The RV appeared normal  . Subarachnoid hemorrhage     03/2012   Past Surgical History:  Past Surgical History  Procedure Date  . Ventriculoperitoneal shunt 03/27/2012    Procedure: SHUNT INSERTION VENTRICULAR-PERITONEAL;  Surgeon: Winfield Cunas, MD;  Location: [facility];  Service: Neurosurgery;  Laterality: N/A;  Ventricular-Peritoneal Shunt Insertion    Assessment / Plan / Recommendation Clinical  Impression  Pt transferred to CIR on 04/05/12 and presents with severe cognitive impairments characterized by decreased sustained attention, functional problem solving, working memory, initiation, intellectual awareness, and overall judgment impacting overall safety and independence for functional tasks.  Pt's overall cognitive abilities also impact pt's ability for self-feeding and PO intake.  Pt would benefit from skilled SLP intervention to maximize cognitive function and overall independence for discharge home.  Anticipate pt will need 24 hour supervision and minimal assistance for cognitive tasks and home health follow-up therapy.     SLP Assessment  Patient will need skilled Speech Lanaguage Pathology Services during CIR admission    Recommendations  Follow up Recommendations: Home Health SLP Equipment Recommended: None recommended by SLP    SLP Frequency 1-2 X/day, 30-60 minutes   SLP Treatment/Interventions Cognitive remediation/compensation;Internal/external aids;Therapeutic Activities;Functional tasks;Environmental controls;Cueing hierarchy;Patient/family education    Pain Pain Assessment Pain Assessment: No/denies pain Pain Score: 10-Worst pain ever Pain Type: Acute pain Pain Location: Neck Pain Orientation: Posterior;Right Pain Descriptors: Aching Pain Frequency: Occasional Pain Onset: On-going Patients Stated Pain Goal: 2 Pain Intervention(s): Medication (See eMAR) (tylenol 650mg  ) Prior Functioning Type of Home: House Lives With: Daughter Available Help at Discharge: Family;Available 24 hours/day (per RN, family reports 33 hour assist is available) Vocation: On disability  Short Term Goals:  SLP Short Term Goal 1 (Week 1): Pt will utilize enviornmental and contextual cues to increase orientation to date and time with Max A verbal and visual cues.  SLP Short Term Goal 2 (Week 1): Pt will sustain attention to a functional task for ~2 minutes with Max A verbal and tactile  cues.  SLP Short Term Goal 3 (Week 1): Pt will demonstrate initiation of self-feeding task with Max A verbal and tactile cues.  SLP Short Term Goal 4 (Week 1): Pt will recall biographical information with Max A semantic and verbal cues.   Skilled Therapeutic Intervention: Administered cognitive-linguistic evaluation and BSE. Please see above for details.   See FIM for current functional status Refer to Care Plan for Long Term Goals  Recommendations for other services: None  Discharge Criteria: Patient will be discharged from SLP if patient refuses treatment 3 consecutive times without medical reason, if treatment goals not met, if there is a change in medical status, if patient makes no progress towards goals or if patient is discharged from hospital.  The above assessment, treatment plan, treatment alternatives and goals were discussed and mutually agreed upon: by patient  [name] 04/06/2012, 11:17 AM

## 2012-04-06 NOTE — Progress Notes (Signed)
INITIAL ADULT NUTRITION ASSESSMENT Date: 04/06/2012   Time: 10:27 AM  Reason for Assessment: Low [place] and Nutrition Risk Report  ASSESSMENT: Female 51 y.o.  Dx: Subarachnoid hemorrhage status post VP shunting 03/27/2012   Hx:  Past Medical History  Diagnosis Date  . HTN (hypertension)     resistant  . Hypercholesterolemia   . Tobacco abuse     resumed smoking half a pack a day. She was a smoker in the past and states she was abl eto stop in the past using nicotine patches  . CAD (coronary artery disease)     Pt has St elevation MI in Feb 2009. Left hear tcath at tha ttime showed a 70% distal LAD lesion that was hazy consistent with a plaque rupture. She had a 50% distal circumflex stenosis an a 95% distal RCA stenosis. She did havve drug eluting stent placed in her distal RCA  . Chronic kidney disease     most recent creatinine 1.4 in 3/10  . Intracranial hemorrhage, spontaneous intraparenchymal, idiopathic, remote, resolved   . Diastolic heart failure     pt most recent echo was in Feb 2009 w an EF of 60% and severe left ventricular hypertrophy. The pt did have evidence of diastolic dysfunction. The RV appeared normal  . Subarachnoid hemorrhage     03/2012   Past Surgical History  Procedure Date  . Ventriculoperitoneal shunt 03/27/2012    Procedure: SHUNT INSERTION VENTRICULAR-PERITONEAL;  Surgeon: Winfield Cunas, MD;  Location: [facility];  Service: Neurosurgery;  Laterality: N/A;  Ventricular-Peritoneal Shunt Insertion    Related Meds:     . amLODipine  10 mg Oral Daily  . antiseptic oral rinse  15 mL Mouth Rinse QID  . cloNIDine  0.3 mg Oral Q8H  . hydrALAZINE  100 mg Oral Q8H  . labetalol  400 mg Oral Q8H  . levETIRAcetam  500 mg Oral Q12H  . losartan  25 mg Oral BID  . senna-docusate  1 tablet Oral BID  . spironolactone  25 mg Oral Daily  . DISCONTD: levetiracetam  500 mg Intravenous Q12H   Ht: 5\' 1"  (154.9 cm)  Wt: 162 lb 7.7 oz (73.7 kg)  Ideal Wt: 47.7  kg % Ideal Wt: 155%  Wt Readings from Last 15 Encounters:  04/05/12 162 lb 7.7 oz (73.7 kg)  04/05/12 161 lb 13.1 oz (73.4 kg)  04/05/12 161 lb 13.1 oz (73.4 kg)  07/07/11 157 lb (71.215 kg)  07/01/11 153 lb (69.4 kg)  06/20/11 152 lb (68.947 kg)  06/13/11 154 lb (69.854 kg)  04/18/11 154 lb (69.854 kg)  06/21/09 148 lb (67.132 kg)  Usual Wt: 150 - 155 lb % Usual Wt: 106%  Body mass index is 30.70 kg/(m^2). Obese Class I.  Food/Nutrition Related Hx: poor PO intake during acute hospitalization  Labs:  CMP     Component Value Date/Time   NA 138 04/06/2012 0500   K 4.8 04/06/2012 0500   CL 102 04/06/2012 0500   CO2 24 04/06/2012 0500   GLUCOSE 108* 04/06/2012 0500   BUN 19 04/06/2012 0500   CREATININE 1.07 04/06/2012 0500   CALCIUM 9.8 04/06/2012 0500   PROT 6.7 04/06/2012 0500   ALBUMIN 3.0* 04/06/2012 0500   AST 11 04/06/2012 0500   ALT 11 04/06/2012 0500   ALKPHOS 83 04/06/2012 0500   BILITOT 0.2* 04/06/2012 0500   GFRNONAA 59* 04/06/2012 0500   GFRAA 69* 04/06/2012 0500   Phosphorus  Date/Time Value Range Status  04/02/2012  3:25 AM 4.1  2.3 - 4.6 mg/dL Final   Magnesium  Date/Time Value Range Status  04/02/2012  3:25 AM 1.6  1.5 - 2.5 mg/dL Final     Intake/Output Summary (Last 24 hours) at 04/06/12 1030 Last data filed at 04/06/12 0519  Gross per 24 hour  Intake      0 ml  Output   1000 ml  Net  -1000 ml   Diet Order: Dysphagia 3 with Thin Liquids  Supplements/Tube Feeding:  IVF:    Estimated Nutritional Needs:   Kcal:  1700 - 1900 kcal Protein:  70 - 85 grams protein Fluid:  1.8 - 2 liters daily  Pt followed by RD during acute hospitalization. Pt was supplemented with Magic Cup at meals and Ensure Complete QID. Pt's intake was extremely poor during acute hospitalization, pt was recommended to start short-term nutrition support on several RD encounters. Jevity 1.2 was briefly initiated at the end of acute hospitalization, however panda was removed and TF  discontinued upon admission to inpatient rehab.   Discussed attention issues with SLP; SLP anticipating pt having difficulty with adequate consumption of meals.  Pt is at risk for malnutrition given suboptimal PO intake during acute hospitalization.  NUTRITION DIAGNOSIS: -Inadequate oral intake (NI-2.1).  Status: Ongoing  RELATED TO: lethargy and poor attention  AS EVIDENCE BY: intake of < 50% of meals.  MONITORING/EVALUATION(Goals): Goal: Pt to consume at least 90% of estimated needs. Monitor: weights, labs, PO intake, I/O's  EDUCATION NEEDS: -Education not appropriate at this time  INTERVENTION: 1. Initiate Calorie Count x 48 hours to assess PO intake adequacy. 2. Add Ensure Complete PO BID 3. Offer Magic Cup PO BID 4. MVI, 1 tab PO daily 5. RD to continue to follow nutrition care plan  Dietitian #: [phone]  [place] Per approved criteria  -Obesity Unspecified    Asencion Partridge 04/06/2012, 10:27 AM

## 2012-04-06 NOTE — Progress Notes (Signed)
Patient information reviewed and entered into [facility] system by Ivadell Gaul, RN, CRRN, PPS Coordinator.  Information including medical coding and functional independence measure will be reviewed and updated through discharge.     Per nursing patient was given "Data Collection Information Summary for Patients in Inpatient Rehabilitation Facilities with attached "Privacy Act Statement-Health Care Records" upon admission.   

## 2012-04-06 NOTE — Care Management (Signed)
Inpatient Rehabilitation Center Individual Statement of Services  Patient Name:  [name]  Date:  04/06/2012  Welcome to the [facility].  Our goal is to provide you with an individualized program based on your diagnosis and situation, designed to meet your specific needs.  With this comprehensive rehabilitation program, you will be expected to participate in at least 3 hours of rehabilitation therapies Monday-Friday, with modified therapy programming on the weekends.  Your rehabilitation program will include the following services:  Physical Therapy (PT), Occupational Therapy (OT), Speech Therapy (ST), 24 hour per day rehabilitation nursing, Therapeutic Recreaction (TR), Case Management (RN and Education officer, museum), Rehabilitation Medicine, Nutrition Services and Pharmacy Services  Weekly team conferences will be held on  Tuesday to discuss your progress.  Your RN Case Writer will talk with you frequently to get your input and to update you on team discussions.  Team conferences with you and your family in attendance may also be held.  Expected length of stay: @ 3 weeks   Overall anticipated outcome: Min Assist  Depending on your progress and recovery, your program may change.  Your RN Case Engineer, production will coordinate services and will keep you informed of any changes.  Your RN Tourist information centre manager and SW names and contact numbers are listed  below.  The following services may also be recommended but are not provided by the [facility] will be made to provide these services after discharge if needed.  Arrangements include referral to agencies that provide these services.  Your insurance has been verified to be:  [employer] + Medicaid Your primary doctor is:    Pertinent  information will be shared with your doctor and your insurance company.  Case Manager: Lutricia Feil, [facility] [phone]  Social Worker:  [place], [place]  Information discussed with and copy given to patient by: Theora Master, 04/06/2012, 12:49 PM

## 2012-04-06 NOTE — H&P (View-Only) (Signed)
Physical Medicine and Rehabilitation Admission H&P    Chief Complaint  Patient presents with  . Hypertension  : HPI: [name] is a 51 y.o. right-handed female with previous intraparenchymal hemorrhage in the past admitted 03/03/2012 with headache, nausea and vomiting. CT of the head showed diffuse subarachnoid hemorrhage in the basilar cisterns, sylvian fissures, along the tentorium and anterior falx. Followup neurosurgery Dr. Cyndy Freeze. Patient had been intubated and placement of ventriculostomy catheter. Patient was later extubated on 03/04/2012 Hospital course with obstructive hydrocephalus and ultimately underwent shunt insertion ventriculoperitoneal right side on 03/27/2012. Patient did again require intubation due to respiratory difficulties and again extubated 03/29/2012. Latest followup CT scan showed satisfactory appearance status post VP shunting. Maintained on Keppra for seizure prophylaxis. Patient is currently tolerating a dysphagia 3 thin liquid diet. Blood pressures have been difficult to manage with followup per cardiology services as well as critical care medicine with question plan to check renal arteries for any stenosis that could be affecting her hypertension.  Physical therapy evaluation occupational therapy completed 03/30/2012 with recommendations for physical medicine rehabilitation consult to consider inpatient rehabilitation services     Review of Systems  Gastrointestinal: Positive for nausea, vomiting and constipation.  Neurological: Positive for headaches.  All other systems reviewed and are negative   Past Medical History  Diagnosis Date  . HTN (hypertension)     resistant  . Hypercholesterolemia   . Tobacco abuse     resumed smoking half a pack a day. She was a smoker in the past and states she was abl eto stop in the past using nicotine patches  . CAD (coronary artery disease)     Pt has St elevation MI in Feb 2009. Left hear tcath at tha ttime showed a 70%  distal LAD lesion that was hazy consistent with a plaque rupture. She had a 50% distal circumflex stenosis an a 95% distal RCA stenosis. She did havve drug eluting stent placed in her distal RCA  . Chronic kidney disease     most recent creatinine 1.4 in 3/10  . Intracranial hemorrhage, spontaneous intraparenchymal, idiopathic, remote, resolved   . Diastolic heart failure     pt most recent echo was in Feb 2009 w an EF of 60% and severe left ventricular hypertrophy. The pt did have evidence of diastolic dysfunction. The RV appeared normal  . Subarachnoid hemorrhage     03/2012   Past Surgical History  Procedure Date  . Ventriculoperitoneal shunt 03/27/2012    Procedure: SHUNT INSERTION VENTRICULAR-PERITONEAL;  Surgeon: Winfield Cunas, MD;  Location: [place] [facility];  Service: Neurosurgery;  Laterality: N/A;  Ventricular-Peritoneal Shunt Insertion   Family History  Problem Relation Age of Onset  . Coronary artery disease Other     premature in 1st degree relatives   Social History:  reports that she has been smoking.  She does not have any smokeless tobacco history on file. She reports that she does not drink alcohol or use illicit drugs. Allergies:  Allergies  Allergen Reactions  . Ampicillin Other (See Comments)    unknown  . Penicillins Other (See Comments)    unknown   Medications Prior to Admission  Medication Sig Dispense Refill  . amLODipine (NORVASC) 10 MG tablet Take 1 tablet (10 mg total) by mouth daily. This is for blood pressure  30 tablet  6  . aspirin EC 81 MG tablet Take 1 tablet (81 mg total) by mouth daily. This is for your heart      .  carvedilol (COREG) 12.5 MG tablet Take 1 tablet (12.5 mg total) by mouth 2 (two) times daily with a meal.  60 tablet  11  . hydrALAZINE (APRESOLINE) 50 MG tablet Take 1 tablet (50 mg total) by mouth 3 (three) times daily. This is for blood pressure  90 tablet  6  . nitroGLYCERIN (NITROSTAT) 0.4 MG SL tablet Place 0.4 mg under the tongue  every 5 (five) minutes as needed. For chest pain        Home: Home Living Lives With: Daughter Available Help at Discharge: Family;Available 24 hours/day (per RN, family reports 24 hour assist is available) Type of Home: House Home Access: Stairs to enter [employer] of Steps: 4 Entrance Stairs-Rails: Can reach both Home Layout: One level Bathroom Shower/Tub: Tub/shower unit;Door Additional Comments: Family not available to confirm living environment   Functional History: Prior Function Able to Take Stairs?: Yes Driving: Yes Vocation: On disability  Functional Status:  Mobility: Bed Mobility Bed Mobility: Supine to Sit;Sit to Supine;Sitting - Scoot to Edge of Bed Supine to Sit: 1: +2 Total assist Supine to Sit: Patient Percentage: 40% Sitting - Scoot to Edge of Bed: 2: Max assist Sit to Supine: 1: +2 Total assist;HOB flat Sit to Supine: Patient Percentage: 20% Transfers Transfers: Programmer, applications Transfers: 1: +2 Total assist Stand Pivot Transfers: Patient Percentage: 40%      ADL: ADL Eating/Feeding: NPO Grooming: Performed;Wash/dry hands;Wash/dry face;Supervision/safety (apply lotion to hands) Where Assessed - Grooming: Supine, head of bed up Upper Body Bathing: Simulated;Moderate assistance Where Assessed - Upper Body Bathing: Supine, head of bed up Lower Body Bathing: Simulated;Maximal assistance Where Assessed - Lower Body Bathing: Supine, head of bed up;Rolling right and/or left Upper Body Dressing: Simulated;+1 Total assistance Where Assessed - Upper Body Dressing: Supported sitting Lower Body Dressing: Simulated;+1 Total assistance Where Assessed - Lower Body Dressing: Supine, head of bed up;Supine, head of bed flat;Rolling right and/or left ADL Comments: Pt. reluctantly agreed to OT due to fatigue.  Pt. moved to EOB with max A.  Pt. continues with flexion of trunk and neck flexion.  Thoracic and lumbar mobilization performed with  facilitation at pelvis, rib cage, and head.  Able to hold upright position for 5-10 second periods.  Pt. sat EOB x 13 mins, before fatiguing.  Pt. returned to supine with total A +2 (pt. ~20%).  Pt. positioned on towel roll along the length of his spine with a folded towel under head to facilitate trunk extension and neck extension.  Asked RN to check and/or remove towel roll in 30-60 mins.  Cognition: Cognition Overall Cognitive Status: Impaired Arousal/Alertness: Awake/alert Orientation Level: Oriented to person;Oriented to place Attention: Focused Focused Attention: Impaired Focused Attention Impairment: Verbal basic;Functional basic Memory: Impaired Memory Impairment: Storage deficit;Retrieval deficit Awareness: Impaired Awareness Impairment: Intellectual impairment Behaviors: Other (comment) (inert) Safety/Judgment: Impaired Cognition Overall Cognitive Status: Impaired Area of Impairment: Memory;Attention Arousal/Alertness: Awake/alert Orientation Level: Time;Situation;Place;Disoriented to Behavior During Session: Flat affect Current Attention Level: Sustained Memory Deficits: decreased recall of information Awareness of Errors: Assistance required to identify errors made;Assistance required to correct errors made Awareness of Deficits: Impaired awareness Problem Solving: slow to process Cognition - Other Comments: Pt. disoriented to day, and time, but when given hints, she was able to deduce correct answers.  Not oriented to place, and unable to retain information 5 mins. later   Blood pressure 150/82, pulse 76, temperature 98.3 F (36.8 C), temperature source Oral, resp. rate 18, height 5\' 1"  (1.549 m),  weight 73.4 kg (161 lb 13.1 oz), SpO2 100.00%. Physical Exam lethargic, Throat: oral mucosa moist Neck: Neck supple. No thyromegaly present.  Cardiovascular: Regular rhythm. No murmurs, rubs, or gallops.  Pulmonary/Chest:  Decreased breath sounds at the bases , no distress,  no wheezes, rales, or rhonchi Abdominal: She exhibits no distension. There is no tenderness. Bowel sounds are positive. Neurological:  Patient is extremely lethargic, encephalopathic appearing, but makes  eye contact with examiner. No gross CN abnormalities, other than pupils are sluggish in reaction.  She followed basic, but was inconsistent with one and two-step commands. She moved the RUE grossly 2-3, LUE 1-2, Bilateral LE's 1+ to 2. She withdraws to pain in all 4's.  Skin:  VP shunt site dressed, staples in place. Area slightly tender to touch. No drainage.  motor strength 4/5 in bilateral hand grip 3 minus bilateral deltoid 4 minus bilateral biceps and triceps 3 minus bilateral hip flexors knee extensors and ankle dorsiflexors  Sensation to light touch is intact  Orientation is to person, "right around the corner from [place]", not to time, admitted that she had bleeding in her brain      Results for orders placed during the hospital encounter of 03/03/12 (from the past 48 hour(s))  BASIC METABOLIC PANEL     Status: Abnormal   Collection Time   04/04/12  7:25 AM      Component Value Range Comment   Sodium 136  135 - 145 mEq/L    Potassium 4.9  3.5 - 5.1 mEq/L    Chloride 100  96 - 112 mEq/L    CO2 24  19 - 32 mEq/L    Glucose, Bld 103 (*) 70 - 99 mg/dL    BUN 21  6 - 23 mg/dL    Creatinine, Ser 1.09  0.50 - 1.10 mg/dL    Calcium 9.7  8.4 - 10.5 mg/dL    GFR calc non Af Amer 58 (*) >90 mL/min    GFR calc Af Amer 67 (*) >90 mL/min   BASIC METABOLIC PANEL     Status: Abnormal   Collection Time   04/05/12  4:23 AM      Component Value Range Comment   Sodium 133 (*) 135 - 145 mEq/L    Potassium 5.0  3.5 - 5.1 mEq/L    Chloride 98  96 - 112 mEq/L    CO2 25  19 - 32 mEq/L    Glucose, Bld 111 (*) 70 - 99 mg/dL    BUN 20  6 - 23 mg/dL    Creatinine, Ser 1.12 (*) 0.50 - 1.10 mg/dL    Calcium 9.7  8.4 - 10.5 mg/dL    GFR calc non Af Amer 56 (*) >90 mL/min    GFR calc Af Amer 65  (*) >90 mL/min    No results found.  Post Admission Physician Evaluation: 1. Functional deficits secondary  to [facility] with hydrocephalus and VP shunt. 2. Patient is admitted to receive collaborative, interdisciplinary care between the physiatrist, rehab nursing staff, and therapy team. 3. Patient's level of medical complexity and substantial therapy needs in context of that medical necessity cannot be provided at a lesser intensity of care such as a SNF. 4. Patient has experienced substantial functional loss from his/her baseline which was documented above under the "Functional History" and "Functional Status" headings.  Judging by the patient's diagnosis, physical exam, and functional history, the patient has potential for functional progress which will result in measurable gains while  on inpatient rehab.  These gains will be of substantial and practical use upon discharge  in facilitating mobility and self-care at the household level. 5. Physiatrist will provide 24 hour management of medical needs as well as oversight of the therapy plan/treatment and provide guidance as appropriate regarding the interaction of the two. 6. 24 hour rehab nursing will assist with bladder management, bowel management, safety, skin/wound care, disease management, medication administration, pain management and patient education  and help integrate therapy concepts, techniques,education, etc. 7. PT will assess and treat for:  fxnl mobility, NMR, LES, AET, safety, CPT.  Goals are: min assist. 8. OT will assess and treat for: UES, ADL's, fxnl mobility, safety, AET, CPT.   Goals are: min to mod assist. 9. SLP will assess and treat for: cognition, communication, swallowing.  Goals are: supervision to mod assist. 10. Case Management and Social Worker will assess and treat for psychological issues and discharge planning. 11. Team conference will be held weekly to assess progress toward goals and to determine barriers to  discharge. 12. Patient will receive at least 3 hours of therapy per day at least 5 days per week. 13. ELOS: 3-4 weeks      Prognosis:  good   Medical Problem List and Plan: 1. Subarachnoid hemorrhage status post VP shunting 03/27/2012 2. DVT Prophylaxis/Anticoagulation: SCDs. Monitor for DVT 3. Seizure prophylaxis. Keppra 500 mg every 12 hours. Monitor for any seizure activity 4. Dysphasia. Diet been advanced to mechanical soft thin liquids. Monitor for any signs of aspiration 5. Hypertension. Norvasc 10 mg daily, clonidine 0.3 mg every 8 hours, hydralazine 100 mg every 8 hours, labetalol 400 mg 3 times a day, Cozaar 25 mg twice a day and Aldactone 25 mg daily. Remains poorly controlled. Discuss with cardiology services plan to check renal arteries for any stenosis. 6. Arousal: maximize sleep wake cycle. Probably would benefit from a stimulant but i don't want to exacerbate her HTN any further. Will consider moving forward based on arousal levels and BP.  04/05/2012, Oval Linsey, MD

## 2012-04-06 NOTE — Patient Care Conference (Signed)
Inpatient RehabilitationTeam Conference Note Date: 04/06/2012   Time: 2:45 PM    Patient Name: [name]      Medical Record Number: [id number]  Date of Birth: 1961-03-04 Sex: Female         Room/Bed: 4003/4003-01 Payor Info: Payor: [employer]  Plan: [employer]  Product Type: *No Product type*     Admitting Diagnosis: SAH  Admit Date/Time:  04/05/2012  6:24 PM Admission Comments: No comment available   Primary Diagnosis:  Subdural hemorrhage Principal Problem: Subdural hemorrhage  Patient Active Problem List   Diagnosis Date Noted  . Subdural hemorrhage 04/06/2012  . Renal artery stenosis 04/05/2012  . Protein calorie malnutrition 04/01/2012  . Severe hypertension 03/17/2012  . Acidosis 03/16/2012  . SAH (subarachnoid hemorrhage) 03/03/2012  . Acute respiratory failure 03/03/2012  . CAD, NATIVE VESSEL 09/19/2008  . DIASTOLIC HEART FAILURE, CHRONIC 09/19/2008  . CHRONIC KIDNEY DISEASE UNSPECIFIED 09/19/2008    Expected Discharge Date: Expected Discharge Date:  (ELOS 3-4 weeks)  Team Members Present: Physician: Dr. Alger Simons Case Manager Present: Lutricia Feil, RN Social Worker Present: Ovidio Kin, LCSW PT Present: Romie Minus, PT;Becky Alwyn Ren, PT OT Present: Willeen Cass, OT;Roanna Epley, COTA SLP Present: Weston Anna, SLP Other (Discipline and Name): Daiva Nakayama, PPS Coordinator     Current Status/Progress Goal Weekly Team Focus  Medical   slow to arouse, bp an issues. vp shunt for hydrocephalus  ritalin trial, bp control  stimulation   Bowel/Bladder   foley incontinent bowel lbm 6-15 miralax given prn no results constipation noted  min assist   montior bowel habits    Swallow/Nutrition/ Hydration   Dys. 3 textures and thin liquids, Max-Total A   Min A  attention and initiation tasks    ADL's   max to total A  supervision to min A  initiation, attention, awareness, orientation   Mobility   max/ total assist  min  assist/supervision  Increase safe mobility, tolerance to activity and being up during the day. Increase contribution to activity.   Communication   delayed responses appropriate mild memory deficits   mod independent  allow patient extra time to communicate   Safety/Cognition/ Behavioral Observations  Total A  Min A  initiation, attention, orientation    Pain   headaches and posterior neck pain  tylenol 650mg   given with little results per patient   pain less than 3   monitor effectivenss of heat to neck   Skin   intact            *See Interdisciplinary Assessment and Plan and progress notes for long and short-term goals  Barriers to Discharge: profound cognitive deficits    Possible Resolutions to Barriers:  cognitive behavioral therapy    Discharge Planning/Teaching Needs:  Home with daughter and sister to assist-aware may need 24 hour care      Team Discussion: HTN, HA, neck stiffness, slow to respond, trouble advancing LLE.  Plan 3-4 week stay.  Will need 24/7 assist at d/c.   Revisions to Treatment Plan: none    Continued Need for Acute Rehabilitation Level of Care: The patient requires daily medical management by a physician with specialized training in physical medicine and rehabilitation for the following conditions: Daily direction of a multidisciplinary physical rehabilitation program to ensure safe treatment while eliciting the highest outcome that is of practical value to the patient.: Yes Daily medical management of patient stability for increased activity during participation in an intensive rehabilitation regime.: Yes  Daily analysis of laboratory values and/or radiology reports with any subsequent need for medication adjustment of medical intervention for : Neurological problems;Other  Theora Master 04/07/2012, 8:38 AM

## 2012-04-06 NOTE — Progress Notes (Signed)
Overall Plan of Care [facility]) Patient Details Name: [name] MRN: [id number] DOB: 1960/11/02  Diagnosis:  SAH with hydrocephalus  Primary Diagnosis:    <principal problem not specified> Co-morbidities: HTN, prior SAH, hypoarousal syndrome  Functional Problem List  Patient demonstrates impairments in the following areas: Balance, Behavior, Bladder, Cognition, Endurance, Motor, Nutrition, Pain, Safety and Sensory   Basic ADL's: eating, grooming, bathing, dressing and toileting Advanced ADL's: simple meal preparation  Transfers:  bed mobility, bed to chair, toilet and tub/shower Locomotion:  ambulation  Additional Impairments:  Social Cognition   social interaction, problem solving, memory, attention and awareness, Leisure Awareness and Discharge Disposition  Anticipated Outcomes Item Anticipated Outcome  Eating/Swallowing  Min A  Basic self-care  supervision  Tolieting  supervision  Bowel/Bladder   Moderate assist  Transfers  supervision  Locomotion  Supervision/min assist  Communication    Cognition  Min A  Pain  Less than 3  Safety/Judgment  Min assist  Other     Therapy Plan: PT Frequency: 2-3 X/day, 60-90 minutes OT Frequency: 1-2 X/day, 60-90 minutes SLP Frequency: 1-2 X/day, 30-60 minutes   Team Interventions: Item RN PT OT SLP SW TR Other  Self Care/Advanced ADL Retraining   x      Neuromuscular Re-Education  x x      Therapeutic Activities  x x x     UE/LE Strength Training/ROM  x x      UE/LE Coordination Activities  x x      Visual/Perceptual Remediation/Compensation         DME/Adaptive Equipment Instruction  x x      Therapeutic Exercise  x x      Balance/Vestibular Training  x x      Patient/Family Education x x x x     Cognitive Remediation/Compensation  x x x     Functional Mobility Training  x x      Ambulation/Gait Training  x       IT trainer  x       Wheelchair Propulsion/Positioning  x       Functional Nurse, mental health Reintegration   x      Dysphagia/Aspiration Designer, multimedia Facilitation   x      Bladder Management x        Bowel Management x        Disease Management/Prevention x        Pain Management x x x      Medication Management x        Skin Care/Wound Management x        Splinting/Orthotics         Discharge Planning  x x x x    Psychosocial Support  x x  x                       Team Discharge Planning: Destination:  Home Projected Follow-up:  OT Projected Equipment Needs:  Walker may need wheelchair pending progress Patient/family involved in discharge planning:  Yes  MD ELOS: 3-4 weeks Medical Rehab Prognosis:  Good Assessment: Pt admitted for CIR therapies. The team will be focusing on mobility, cognitive/stimulation activities, neuromuscular reeducation, safety, balance, ADLs, bowel and bladder continence, communication. Goals overall are min assist to supervision with bowel and bladder continence goals set at moderate assist initially. Patient's hypo-arousal is a problem at present. Her blood  pressure is also quite labile. It appears family also needs education regarding her injury.

## 2012-04-06 NOTE — Progress Notes (Signed)
Physical Therapy Session Note  Patient Details  Name: [name] MRN: [id number] Date of Birth: [birth year]  Today's Date: 04/06/2012 Time: [id number] Time Calculation (min): 35 min  Short Term Goals: Week 1:  PT Short Term Goal 1 (Week 1): Pt will perform sit <> stand consistently with moderate assist.  PT Short Term Goal 1 - Progress (Week 1): Progressing toward goal PT Short Term Goal 2 (Week 1): Pt will ambulate 15' with RW, moderate assist.  PT Short Term Goal 2 - Progress (Week 1): Progressing toward goal PT Short Term Goal 3 (Week 1): Pt will perform stand pivot transfers with moderate assist consistenly.  PT Short Term Goal 3 - Progress (Week 1): Progressing toward goal PT Short Term Goal 4 (Week 1): Pt will tolerate 3 continuous minutes of standing activity with moderate assist.  PT Short Term Goal 4 - Progress (Week 1): Progressing toward goal  Skilled Therapeutic Interventions/Progress Updates:  Pain: 10/10 Posterior neck pain when in sitting, called RN for medication, provided with heat pack.  Pt less participatory this afternoon, upon sitting EOB pt required mod assist to keep from returning to bed. Side to sit performed with moderate assist. 4 sit <> stands with moderate/max assist. Attempted ambulation x 3 attempts, pt able to take one step, unable to progress Lt. LE. Pt with decreased ability to appropriately support self with RW, leaning over and trying to place head on RW demonstrating decreased safety awareness.  Stand pivot transfer to wheelchair with out device with moderate assist. Verbal cues/tactile cues throughout session for step by step sequencing and for safety.  Continues to be most limited by cognition.  Therapy Documentation Precautions:  Precautions Precautions: Fall Restrictions Weight Bearing Restrictions: No  See FIM for current functional status  Therapy/Group: Individual Therapy  Lahoma Rocker 04/06/2012, 2:04 PM

## 2012-04-07 DIAGNOSIS — Z5189 Encounter for other specified aftercare: Secondary | ICD-10-CM

## 2012-04-07 DIAGNOSIS — I609 Nontraumatic subarachnoid hemorrhage, unspecified: Secondary | ICD-10-CM

## 2012-04-07 DIAGNOSIS — G911 Obstructive hydrocephalus: Secondary | ICD-10-CM

## 2012-04-07 DIAGNOSIS — I10 Essential (primary) hypertension: Secondary | ICD-10-CM

## 2012-04-07 LAB — GLUCOSE, CAPILLARY

## 2012-04-07 MED ORDER — METHYLPHENIDATE HCL 5 MG PO TABS
5.0000 mg | ORAL_TABLET | Freq: Two times a day (BID) | ORAL | Status: DC
  Administered 2012-04-07 – 2012-04-08 (×3): 5 mg via ORAL
  Filled 2012-04-07 (×3): qty 1

## 2012-04-07 NOTE — Progress Notes (Signed)
Speech Language Pathology Daily Session Note [facility]   Patient Details  Name: [name] MRN: [id number] Date of Birth: 11-05-1960  Today's Date: 04/07/2012 Time: 1130-1200 Time Calculation (min): 30 min  Short Term Goals: Week 1: SLP Short Term Goal 1 (Week 1): Pt will utilize enviornmental and contextual cues to increase orientation to date and time with Max A verbal and visual cues.  SLP Short Term Goal 2 (Week 1): Pt will sustain attention to a functional task for ~2 minutes with Max A verbal and tactile cues.  SLP Short Term Goal 3 (Week 1): Pt will demonstrate initiation of self-feeding task with Max A verbal and tactile cues.  SLP Short Term Goal 4 (Week 1): Pt will recall biographical information with Max A semantic and verbal cues.   Skilled Therapeutic Interventions: Pt seen in [employer] with treatment focus on initiation of self-feeding. Pt required total A multimodal cueing for initiation of meal and clinician had to eventually feed pt. Pt with less 25% PO intake and pt reporting, "I just want to go back to bed" throughout session.    FIM:  Comprehension Comprehension Mode: Auditory Comprehension: 3-Understands basic 50 - 74% of the time/requires cueing 25 - 50%  of the time Expression Expression: 2-Expresses basic 25 - 49% of the time/requires cueing 50 - 75% of the time. Uses single words/gestures. Social Interaction Social Interaction: 2-Interacts appropriately 25 - 49% of time - Needs frequent redirection. Problem Solving Problem Solving: 1-Solves basic less than 25% of the time - needs direction nearly all the time or does not effectively solve problems and may need a restraint for safety Memory Memory: 1-Recognizes or recalls less than 25% of the time/requires cueing greater than 75% of the time FIM - Eating Eating Activity: 1: Helper feeds patient  Pain No/Denies Pain  Therapy/Group: Dysphagia Group  Shannan Slinker 04/07/2012, 2:53 PM

## 2012-04-07 NOTE — Progress Notes (Signed)
Patient ID: [name], female   DOB: 1961-04-04, 51 y.o.   MRN: [id number] Patient ID: [name], female   DOB: 01-14-1961, 51 y.o.   MRN: [id number] Subjective/Complaints: No problems over night. A little less lethargic this am. Denies headache today but doesn't "feel well" Ros otherwise negative or unobtainable this am Objective: Vital Signs: Blood pressure 153/85, pulse 92, temperature 98.4 F (36.9 C), temperature source Oral, resp. rate 18, height 5\' 1"  (1.549 m), weight 73.7 kg (162 lb 7.7 oz), SpO2 100.00%. No results found.  Basename 04/06/12 0500  WBC 6.8  HGB 10.4*  HCT 31.8*  PLT 256    Basename 04/06/12 0500 04/05/12 0423  NA 138 133*  K 4.8 5.0  CL 102 98  CO2 24 25  GLUCOSE 108* 111*  BUN 19 20  CREATININE 1.07 1.12*  CALCIUM 9.8 9.7   CBG (last 3)  No results found for this basename: GLUCAP:3 in the last 72 hours  Wt Readings from Last 3 Encounters:  04/05/12 73.7 kg (162 lb 7.7 oz)  04/05/12 73.4 kg (161 lb 13.1 oz)  04/05/12 73.4 kg (161 lb 13.1 oz)    Physical Exam:  General appearance: no distress and slowed mentation Head: Normocephalic, without obvious abnormality, atraumatic Eyes: conjunctivae/corneas clear. PERRL, EOM's intact. Fundi benign. Ears: normal TM's and external ear canals both ears Nose: Nares normal. Septum midline. Mucosa normal. No drainage or sinus tenderness. Throat: lips, mucosa, and tongue normal; teeth and gums normal Neck: no adenopathy, no carotid bruit, no JVD, supple, symmetrical, trachea midline and thyroid not enlarged, symmetric, no tenderness/mass/nodules Back: symmetric, no curvature. ROM normal. No CVA tenderness. Resp: clear to auscultation bilaterally Cardio: regular rate and rhythm, S1, S2 normal, no murmur, click, rub or gallop GI: soft, non-tender; bowel sounds normal; no masses,  no organomegaly Extremities: extremities normal, atraumatic, no cyanosis or edema Pulses: 2+ and symmetric Skin: Skin color,  texture, turgor normal. No rashes or lesions Neurologic: slow to arouse. Moves right more than left. Withdraws to pain. Opens eyes for me and follows simple commands. Incision/Wound: incision clean with staples, no drainage   Assessment/Plan: 1. Functional deficits secondary to [facility] with hydrocephalus s/p VP shunt which require 3+ hours per day of interdisciplinary therapy in a comprehensive inpatient rehab setting. Physiatrist is providing close team supervision and 24 hour management of active medical problems listed below. Physiatrist and rehab team continue to assess barriers to discharge/monitor patient progress toward functional and medical goals. FIM: FIM - Bathing Bathing Steps Patient Completed: Chest;Right Arm;Left Arm;Abdomen Bathing: 2: Max-Patient completes 3-4 13f 10 parts or 25-49%  FIM - Upper Body Dressing/Undressing Upper body dressing/undressing: 0: Wears gown/pajamas-no public clothing FIM - Lower Body Dressing/Undressing Lower body dressing/undressing: 0: Wears Interior and spatial designer        FIM - IT sales professional Transfer: 2: Supine > Sit: Max A (lifting assist/Pt. 25-49%);2: Bed > Chair or W/C: Max A (lift and lower assist)  FIM - Locomotion: Wheelchair Distance: 15' Locomotion: Wheelchair: 1: Travels less than 50 ft with maximal assistance (Pt: 25 - 49%) FIM - Locomotion: Ambulation Locomotion: Ambulation Assistive Devices: Administrator Ambulation/Gait Assistance: 1: +1 Total assist Locomotion: Ambulation: 1: Two helpers (One person to follow with chair, only able to take 1 step)  Comprehension Comprehension Mode: Auditory Comprehension: 3-Understands basic 50 - 74% of the time/requires cueing 25 - 50%  of the time  Expression Expression Mode: Verbal Expression: 2-Expresses basic 25 - 49% of the time/requires cueing 50 -  75% of the time. Uses single words/gestures.  Social Interaction Social Interaction: 2-Interacts  appropriately 25 - 49% of time - Needs frequent redirection.  Problem Solving Problem Solving: 1-Solves basic less than 25% of the time - needs direction nearly all the time or does not effectively solve problems and may need a restraint for safety  Memory Memory: 1-Recognizes or recalls less than 25% of the time/requires cueing greater than 75% of the time  1. Subarachnoid hemorrhage status post VP shunting 03/27/2012   -will start low dose ritalin, observing closely for any increase in her bp/hr 2. DVT Prophylaxis/Anticoagulation: SCDs. Monitor for DVT  3. Seizure prophylaxis. Keppra 500 mg every 12 hours. Monitor for any seizure activity  4. Dysphasia. Diet been advanced to mechanical soft thin liquids. Monitor for any signs of aspiration  5. Hypertension. Norvasc 10 mg daily, clonidine 0.3 mg every 8 hours, hydralazine 100 mg every 8 hours, labetalol 400 mg 3 times a day, Cozaar 25 mg twice a day and Aldactone 25 mg daily  -bp has been poorly controlled   - Discuss with cardiology services plan to check renal arteries for any stenosis.  6. Arousal: maximize sleep wake cycle. Probably would benefit from a stimulant but i don't want to exacerbate her HTN any further. Will consider moving forward based on arousal levels and BP. i will discuss with team. 7. Hyponatremia- improving  LOS (Days) 2 A FACE TO FACE EVALUATION WAS PERFORMED  [name] T 04/07/2012, 7:39 AM

## 2012-04-07 NOTE — Progress Notes (Signed)
Physical Therapy Session Note  Patient Details  Name: [name] MRN: [id number] Date of Birth: April 20, 1961  Today's Date: 04/07/2012 Time: [id number] Time Calculation (min): 60 min  Short Term Goals: Week 1:  PT Short Term Goal 1 (Week 1): Pt will perform sit <> stand consistently with moderate assist.  PT Short Term Goal 1 - Progress (Week 1): Progressing toward goal PT Short Term Goal 2 (Week 1): Pt will ambulate 15' with RW, moderate assist.  PT Short Term Goal 2 - Progress (Week 1): Progressing toward goal PT Short Term Goal 3 (Week 1): Pt will perform stand pivot transfers with moderate assist consistenly.  PT Short Term Goal 3 - Progress (Week 1): Progressing toward goal PT Short Term Goal 4 (Week 1): Pt will tolerate 3 continuous minutes of standing activity with moderate assist.  PT Short Term Goal 4 - Progress (Week 1): Progressing toward goal  Skilled Therapeutic Interventions/Progress Updates:    Treatment focused on increased activity tolerance and improved attention to task. Pt extremely slow to initiate, often requires assist to initiate movement. Pt performed stand pivot transfer bed > wheelchair with moderate/max assist with RW. Multiple attempts required as pt will stand then sit prior to transfer "I want to sit." Wheelchair mobility x 35' in room and hallway, pt having difficulty avoiding collisions with walls, required hand over hand assist for turning chair. Pt seated in front of mirror, verbal/tactile cues to upright head. Pt reporting significant stretch of what appears to be Rt. Upper trapezius and Lt. Sternocleidomastoid - suspect slight torticollis. Pt assisted with easy stretch of muscles.   Repeated sit <> stands with moderate assist in front of mirror performing writing task with Lt. UE working upright posture and decreasing UE reliance in standing.   Therapy Documentation Precautions:  Precautions Precautions: Fall Restrictions Weight Bearing Restrictions:  No Vital Signs: Therapy Vitals Temp: 97.2 F (36.2 C) Temp src: Oral Pulse Rate: 72  Resp: 16  BP: 101/64 mmHg Patient Position, if appropriate: Lying Oxygen Therapy SpO2: 98 % O2 Device: None (Room air) Pain:  Pt not describing pain during session other than a stretch with torticollis stretch.  Locomotion : Wheelchair Mobility Distance: 35' pt required hand over hand assist and max verbal cues for sequencing and avoiding obstacles.    See FIM for current functional status  Therapy/Group: Individual Therapy  Lahoma Rocker 04/07/2012, 5:44 PM

## 2012-04-07 NOTE — Care Management Note (Signed)
Per State Regulation 482.30 This chart was reviewed for medical necessity with respect to the patient's Admission/Duration of stay. Pt participating txs-needs cues, slow to respond, general weakness [esp. L side]--mod-total assist.  ELOS 3-4 weeks.  Pt's daughter, [name], given this info &, hoped for, goals of S-Min A/ need for care 24/7.  Daughter worried because pt "thinks it's 2001"--reviewed some ST session notes w/ daughter.   Theora Master                 Nurse Care Manager              Next Review Date: 04/12/12

## 2012-04-07 NOTE — Progress Notes (Signed)
Nutrition Follow-up/Calorie Count Follow-Up  Calorie count reveals intake of 800 kcal and 31 grams of protein within the past 24 hours. This meets 47% of minimum estimated calorie needs and 44% of minimum estimated protein needs.  Diet Order:  Dysphagia 3 diet with thin liquids Supplement: Ensure Complete PO BID  Meds: Scheduled Meds:   . amLODipine  10 mg Oral Daily  . antiseptic oral rinse  15 mL Mouth Rinse QID  . cloNIDine  0.3 mg Oral Q8H  . feeding supplement  237 mL Oral BID BM  . hydrALAZINE  100 mg Oral Q8H  . labetalol  400 mg Oral Q8H  . levETIRAcetam  500 mg Oral Q12H  . losartan  25 mg Oral BID  . methylphenidate  5 mg Oral BID WC  . multivitamin with minerals  1 tablet Oral Daily  . senna-docusate  1 tablet Oral BID  . spironolactone  25 mg Oral Daily   Continuous Infusions:  PRN Meds:.acetaminophen, ondansetron (ZOFRAN) IV, ondansetron, polyethylene glycol, sorbitol  Labs:  CMP     Component Value Date/Time   NA 138 04/06/2012 0500   K 4.8 04/06/2012 0500   CL 102 04/06/2012 0500   CO2 24 04/06/2012 0500   GLUCOSE 108* 04/06/2012 0500   BUN 19 04/06/2012 0500   CREATININE 1.07 04/06/2012 0500   CALCIUM 9.8 04/06/2012 0500   PROT 6.7 04/06/2012 0500   ALBUMIN 3.0* 04/06/2012 0500   AST 11 04/06/2012 0500   ALT 11 04/06/2012 0500   ALKPHOS 83 04/06/2012 0500   BILITOT 0.2* 04/06/2012 0500   GFRNONAA 59* 04/06/2012 0500   GFRAA 69* 04/06/2012 0500     Intake/Output Summary (Last 24 hours) at 04/07/12 0924 Last data filed at 04/07/12 0800  Gross per 24 hour  Intake    480 ml  Output   1650 ml  Net  -1170 ml  BM on 6/15  Weight Status:  162 lb (6/17)  Body mass index is 30.70 kg/(m^2). Obese Class I  Estimated needs:  1700 - 1900 kcal, 70 - 85 grams protein, 1.8 - 2 liters daily  Nutrition Dx:  Inadequate oral intake r/t lethargy and poor attention AEB intake of <50% of meals. Persists.  Goal:  Pt to consume at least 90% of estimated needs.  Unmet.  Intervention:  Continue current calorie count x 24 more hours. RD to continue to follow and provide calorie count summary tomorrow. Discussed calorie count results with current RN, encouraged intake of supplements as able.  Monitor:  weights, labs, PO intake, I/O's  Asencion Partridge Pager #:  [phone]

## 2012-04-07 NOTE — Progress Notes (Signed)
Speech Language Pathology Daily Session Note  Patient Details  Name: [name] MRN: [id number] Date of Birth: October 18, 1961  Today's Date: 04/07/2012 Time: 1000-1100 Time Calculation (min): 60 min  Short Term Goals:  SLP Short Term Goal 1 (Week 1): Pt will utilize enviornmental and contextual cues to increase orientation to date and time with Max A verbal and visual cues.  SLP Short Term Goal 2 (Week 1): Pt will sustain attention to a functional task for ~2 minutes with Max A verbal and tactile cues.  SLP Short Term Goal 3 (Week 1): Pt will demonstrate initiation of self-feeding task with Max A verbal and tactile cues.  SLP Short Term Goal 4 (Week 1): Pt will recall biographical information with Max A semantic and verbal cues.   Skilled Therapeutic Interventions: Treatment focus on orientation, initiation and sustained attention to self-feeding task. Pt required Max A verbal , contextual and visual cues to initiate and Total A with scooping food onto utensil to attend to self-feeding task with every bite. Pt also required Mod verbal and question cues to attend to bolus in oral cavity and required liquid washes to clear oral residue. Total A semantic, contextual and verbal cues needed to orient to place. Pt independently oriented to situation but required Max A verbal cues for redirection for topic maintenance throughout functional conversation. Pt perseverative on asking clinician about "schools around the area."    FIM:  Comprehension Comprehension: 3-Understands basic 50 - 74% of the time/requires cueing 25 - 50%  of the time Expression Expression: 2-Expresses basic 25 - 49% of the time/requires cueing 50 - 75% of the time. Uses single words/gestures. Social Interaction Social Interaction: 2-Interacts appropriately 25 - 49% of time - Needs frequent redirection. Problem Solving Problem Solving: 1-Solves basic less than 25% of the time - needs direction nearly all the time or does not  effectively solve problems and may need a restraint for safety Memory Memory: 1-Recognizes or recalls less than 25% of the time/requires cueing greater than 75% of the time FIM - Eating Eating Activity: 4: Helper occasionally scoops food on utensil  Pain Pain Assessment Pain Assessment: No/denies pain  Therapy/Group: Individual Therapy  Camera Krienke 04/07/2012, 11:08 AM

## 2012-04-07 NOTE — Progress Notes (Signed)
Occupational Therapy Session Note  Patient Details  Name: Joyann Tivis MRN: [id number] Date of Birth: Oct 19, 1961  Today's Date: 04/07/2012 Time: 1000-1100 Time Calculation (min): 60 min  Short Term Goals: Week 1:  OT Short Term Goal 1 (Week 1): Pt will perform basic transfer (bed, chair, 3:1) with mod A 75% of the time OT Short Term Goal 2 (Week 1): Pt will sustain attention to a familiar task for 2 minutes with max A OT Short Term Goal 3 (Week 1): Pt will don shirt with minA with mod verbal cues OT Short Term Goal 4 (Week 1): Pt will perform sit to stand with mod A with max cuing for initiation  Skilled Therapeutic Interventions/Progress Updates:  Pt stated her neck was uncomfortable.  Repositioning and soft tissue mobilizations alleviated pain.  Pt unable to rate pain.    Pt engaged in ADL retraining including bathing at w/c level at sink.  Pt required mix verbal cues to initiate bathing tasks but completed tasks when presented with supplies.  Pt performed sit to stand at sink with mod assist and required steady assist while standing.  Pt leaned onto elbows at sink after standing approx 60 seconds.  Pt required total A for bathing buttocks.  Pt interacted only when asked direct questions and would respond after approx 15-30 seconds delay.  Focus on initiation, attention, sequencing, and standing balance.  Therapy Documentation Precautions:  Precautions Precautions: Fall Restrictions Weight Bearing Restrictions: No  See FIM for current functional status  Therapy/Group: Individual Therapy  Leroy Libman 04/07/2012, 12:53 PM

## 2012-04-08 DIAGNOSIS — G911 Obstructive hydrocephalus: Secondary | ICD-10-CM

## 2012-04-08 DIAGNOSIS — Z5189 Encounter for other specified aftercare: Secondary | ICD-10-CM

## 2012-04-08 DIAGNOSIS — I10 Essential (primary) hypertension: Secondary | ICD-10-CM

## 2012-04-08 DIAGNOSIS — I609 Nontraumatic subarachnoid hemorrhage, unspecified: Secondary | ICD-10-CM

## 2012-04-08 MED ORDER — ENSURE PUDDING PO PUDG
1.0000 | Freq: Two times a day (BID) | ORAL | Status: DC
  Administered 2012-04-08 – 2012-04-09 (×2): 1 via ORAL

## 2012-04-08 MED ORDER — METHYLPHENIDATE HCL 5 MG PO TABS
10.0000 mg | ORAL_TABLET | Freq: Two times a day (BID) | ORAL | Status: DC
  Administered 2012-04-08 – 2012-04-11 (×6): 10 mg via ORAL
  Filled 2012-04-08 (×7): qty 2

## 2012-04-08 MED ORDER — PRO-STAT SUGAR FREE PO LIQD
30.0000 mL | Freq: Two times a day (BID) | ORAL | Status: DC
  Filled 2012-04-08 (×10): qty 30

## 2012-04-08 NOTE — Progress Notes (Signed)
Patient continues to have poor po intake  . Patient will drink ensure supplements liquid . Patient requires max cues to swallow medications crushed in ensure pudding. Patient would not swallow medications crushed in ensure pudding this pm. Staff attempted to give coke after medications . Patient continued to hold medications in mouth and spit medications out. Blood pressure 136/85 pulse 74bpm . Continue to monitor how patient is tolerating medication administration .   Continue with plan of care.                                        Mliss Sax

## 2012-04-08 NOTE — Progress Notes (Signed)
Patient refused to eat dinner . Multiple attempts made to allow patient to rest and then eat but patient refused . Patient tolerated sitting up after therapy session until 1730. Attempted to feed patient and patient to feed self at 1700 and 1800 and 1845 . Patient requires multiple attempts to swallow pills crushed in puree followed by coke to drink . Patient successful with medications at times .no family in to visit at present time. Continue with plan of care.     [name]

## 2012-04-08 NOTE — Progress Notes (Signed)
Speech Language Pathology Daily Session Note  Patient Details  Name: Kalene Abdo MRN: [id number] Date of Birth: 05-Mar-1961  Today's Date: 04/08/2012 Time: 0800-0900 Time Calculation (min): 60 min  Short Term Goals:  SLP Short Term Goal 1 (Week 1): Pt will utilize enviornmental and contextual cues to increase orientation to date and time with Max A verbal and visual cues.  SLP Short Term Goal 2 (Week 1): Pt will sustain attention to a functional task for ~2 minutes with Max A verbal and tactile cues.  SLP Short Term Goal 3 (Week 1): Pt will demonstrate initiation of self-feeding task with Max A verbal and tactile cues.  SLP Short Term Goal 4 (Week 1): Pt will recall biographical information with Max A semantic and verbal cues.   Skilled Therapeutic Interventions: Treatment focus on initiation and attention to functional task of self-feeding. Pt with increased initiation of meal with extra time and verbal cue X 1. Pt initiated 3 bites with Min verbal cues but required total A for remainder of meal. Pt required Max A semantic and contextual cues for orientation to place and situation. Pt required Max A semantic cues to recall biographical information in regards to family members.     FIM:  Comprehension Comprehension Mode: Auditory Comprehension: 3-Understands basic 50 - 74% of the time/requires cueing 25 - 50%  of the time Expression Expression Mode: Verbal Expression: 2-Expresses basic 25 - 49% of the time/requires cueing 50 - 75% of the time. Uses single words/gestures. Social Interaction Social Interaction: 2-Interacts appropriately 25 - 49% of time - Needs frequent redirection. Problem Solving Problem Solving: 1-Solves basic less than 25% of the time - needs direction nearly all the time or does not effectively solve problems and may need a restraint for safety Memory Memory: 1-Recognizes or recalls less than 25% of the time/requires cueing greater than 75% of the time FIM -  Eating Eating Activity: 4: Helper occasionally scoops food on utensil  Pain Pain Assessment Pain Assessment: No/denies pain  Therapy/Group: Individual Therapy  Damari Hiltz, Shannon 04/08/2012, 1:29 PM

## 2012-04-08 NOTE — Progress Notes (Signed)
Subjective/Complaints: No problems over night. Denies pain. More alert today Ros otherwise negative Objective: Vital Signs: Blood pressure 135/86, pulse 77, temperature 97.7 F (36.5 C), temperature source Oral, resp. rate 17, height 5\' 1"  (1.549 m), weight 73.5 kg (162 lb 0.6 oz), SpO2 99.00%. No results found.  Basename 04/06/12 0500  WBC 6.8  HGB 10.4*  HCT 31.8*  PLT 256    Basename 04/06/12 0500  NA 138  K 4.8  CL 102  CO2 24  GLUCOSE 108*  BUN 19  CREATININE 1.07  CALCIUM 9.8   CBG (last 3)   Basename 04/07/12 1635  GLUCAP 110*    Wt Readings from Last 3 Encounters:  04/07/12 73.5 kg (162 lb 0.6 oz)  04/05/12 73.4 kg (161 lb 13.1 oz)  04/05/12 73.4 kg (161 lb 13.1 oz)    Physical Exam:  General appearance: no distress and slowed mentation Head: Normocephalic, without obvious abnormality, atraumatic Eyes: conjunctivae/corneas clear. PERRL, EOM's intact. Fundi benign. Ears: normal TM's and external ear canals both ears Nose: Nares normal. Septum midline. Mucosa normal. No drainage or sinus tenderness. Throat: lips, mucosa, and tongue normal; teeth and gums normal Neck: no adenopathy, no carotid bruit, no JVD, supple, symmetrical, trachea midline and thyroid not enlarged, symmetric, no tenderness/mass/nodules Back: symmetric, no curvature. ROM normal. No CVA tenderness. Resp: clear to auscultation bilaterally Cardio: regular rate and rhythm, S1, S2 normal, no murmur, click, rub or gallop GI: soft, non-tender; bowel sounds normal; no masses,  no organomegaly Extremities: extremities normal, atraumatic, no cyanosis or edema Pulses: 2+ and symmetric Skin: Skin color, texture, turgor normal. No rashes or lesions Neurologic: awake, slow to process. Moves all 4's nearly equal, perhaps quicker to move right still. Sensation grossly normal. No focal CN signs.  Incision/Wound: incision clean with staples, no drainage   Assessment/Plan: 1. Functional deficits  secondary to [facility] with hydrocephalus s/p VP shunt which require 3+ hours per day of interdisciplinary therapy in a comprehensive inpatient rehab setting. Physiatrist is providing close team supervision and 24 hour management of active medical problems listed below. Physiatrist and rehab team continue to assess barriers to discharge/monitor patient progress toward functional and medical goals. FIM: FIM - Bathing Bathing Steps Patient Completed: Chest;Right Arm;Left Arm;Abdomen;Right upper leg;Left upper leg Bathing: 3: Mod-Patient completes 5-7 38f 10 parts or 50-74%  FIM - Upper Body Dressing/Undressing Upper body dressing/undressing: 0: Wears gown/pajamas-no public clothing FIM - Lower Body Dressing/Undressing Lower body dressing/undressing: 0: Wears Interior and spatial designer        FIM - Control and instrumentation engineer Devices: Adult nurse Transfer: 3: Supine > Sit: Mod A (lifting assist/Pt. 50-74%/lift 2 legs;3: Bed > Chair or W/C: Mod A (lift or lower assist);3: Chair or W/C > Bed: Mod A (lift or lower assist)  FIM - Locomotion: Wheelchair Distance: 35' pt required hand over hand assist and max verbal cues for sequencing and avoiding obstacles.  Locomotion: Wheelchair: 1: Travels less than 50 ft with maximal assistance (Pt: 25 - 49%) FIM - Locomotion: Ambulation Locomotion: Ambulation Assistive Devices: Administrator Ambulation/Gait Assistance: 1: +1 Total assist Locomotion: Ambulation: 1: Two helpers (One person to follow with chair, only able to take 1 step)  Comprehension Comprehension Mode: Auditory Comprehension: 3-Understands basic 50 - 74% of the time/requires cueing 25 - 50%  of the time  Expression Expression Mode: Verbal Expression: 2-Expresses basic 25 - 49% of the time/requires cueing 50 - 75% of the time. Uses single words/gestures.  Social Interaction Social Interaction: 2-Interacts  appropriately 25 - 49% of time - Needs frequent  redirection.  Problem Solving Problem Solving: 1-Solves basic less than 25% of the time - needs direction nearly all the time or does not effectively solve problems and may need a restraint for safety  Memory Memory: 1-Recognizes or recalls less than 25% of the time/requires cueing greater than 75% of the time  1. 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 DVT Prophylaxis/Anticoagulation: SCDs. Monitor for DVT  3. Seizure prophylaxis. Keppra 500 mg every 12 hours. Monitor for any seizure activity  4. Dysphasia. Diet been advanced to mechanical soft thin liquids. Monitor for any signs of aspiration  5. Hypertension. Norvasc 10 mg daily, clonidine 0.3 mg every 8 hours, hydralazine 100 mg every 8 hours, labetalol 400 mg 3 times a day, Cozaar 25 mg twice a day and Aldactone 25 mg daily  -bp showing improvement  - Discuss with cardiology services plan to check renal arteries for any stenosis.  6. Arousal: maximize sleep wake cycle. Ritalin trial 7. Hyponatremia- improving  LOS (Days) 3 A FACE TO FACE EVALUATION WAS PERFORMED  Mikolaj Woolstenhulme T 04/08/2012, 7:02 AM

## 2012-04-08 NOTE — Progress Notes (Signed)
Physical Therapy Session Note  Patient Details  Name: [name] MRN: [id number] Date of Birth: 1961-08-05  Today's Date: 04/08/2012 Time: [id number] Time Calculation (min): 44 min  Short Term Goals: Week 1:  PT Short Term Goal 1 (Week 1): Pt will perform sit <> stand consistently with moderate assist, moderate cues to initiate. PT Short Term Goal 1 - Progress (Week 1): Progressing toward goal PT Short Term Goal 2 (Week 1): Pt will ambulate 2' with  least restrictive device, max assist.  PT Short Term Goal 2 - Progress (Week 1): Revised due to lack of progress PT Short Term Goal 3 (Week 1): Pt will perform stand pivot transfers with moderate assist consistenly, moderate cues for initiation PT Short Term Goal 3 - Progress (Week 1): Progressing toward goal PT Short Term Goal 4 (Week 1): Pt will tolerate 2 continuous minutes of standing activity with moderate assist PT Short Term Goal 4 - Progress (Week 1): Revised due to lack of progress  Skilled Therapeutic Interventions/Progress Updates:    Wheelchair mobility x 45' from nurses station nearly to room with moderate assist, pt with much improved ability to turn chair and negotiate obstacles with maximal verbal cues. Pt able to locate and unlock brakes, needs assist to lock brakes. Sitting EOB while trying to focus on taking medication with max verbal cues to attend to task. Pt still with torticollis neck posture, active assistive corrective stretch. Repeated sit <> stands at sink with RW, moderate assist max verbal/tactile cues for initiation. Standing activities: punching a balloon and forward toe taps practicing decreased UE support and pre-gait exercise. Limited motivation.    Goals revised due to slow progess.  Therapy Documentation Precautions:  Precautions Precautions: Fall Restrictions Weight Bearing Restrictions: No Vital Signs: Therapy Vitals Temp: 98.6 F (37 C) Temp src: Oral Pulse Rate: 73  Resp: 18  BP: 120/80  mmHg Patient Position, if appropriate: Sitting Oxygen Therapy SpO2: 100 % O2 Device: None (Room air) Pain: Pain Assessment Pain Assessment: No/denies pain Locomotion : Wheelchair Mobility Distance: 66'    See FIM for current functional status  Therapy/Group: Individual Therapy  Lahoma Rocker 04/08/2012, 5:18 PM

## 2012-04-08 NOTE — Plan of Care (Signed)
Problem: RH BOWEL ELIMINATION Goal: RH STG MANAGE BOWEL W/MEDICATION W/ASSISTANCE STG Manage Bowel with Medication with Assistance.  Outcome: Not Progressing Patient given sorbitol without results. Poor po intake noted .

## 2012-04-08 NOTE — Progress Notes (Signed)
Occupational Therapy Session Note  Patient Details  Name: [name] MRN: [id number] Date of Birth: [birth year]  Today's Date: 04/08/2012 Time: 1000-1100 Time Calculation (min): 60 min  Short Term Goals: Week 1:  OT Short Term Goal 1 (Week 1): Pt will perform basic transfer (bed, chair, 3:1) with mod A 75% of the time OT Short Term Goal 2 (Week 1): Pt will sustain attention to a familiar task for 2 minutes with max A OT Short Term Goal 3 (Week 1): Pt will don shirt with minA with mod verbal cues OT Short Term Goal 4 (Week 1): Pt will perform sit to stand with mod A with max cuing for initiation  Skilled Therapeutic Interventions/Progress Updates:    Pt in bed sleeping and required tactile cues and max verbal cues to arouse.  Pt required tactile cues to initiate sitting EOB.  During stand pivot transfer to w/c pt attempted to sit down X 3 during transfer requiring tot A for maintain erect posture.  Pt does not have clothing at this time but was agreeable to washing up at sink and changing gowns.  Pt required max verbal cues, tactile cues, and physical gestures to initiate and complete bathing tasks.  Pt unable to assist with LB bathing.  Pt performed sit to stand with min A when permitted to pull up from sink.  Pt able to stand at sink with steady assist for approx 30 seconds.  Pt lethargic throughout session and required verbal cues throughout session to participate.  Focus on arousal, initiation, and attention to task.  Therapy Documentation Precautions:  Precautions Precautions: Fall Restrictions Weight Bearing Restrictions: No   Pain: Pt c/o discomfort in right side of neck; unable to rate; RN aware   See FIM for current functional status  Therapy/Group: Individual Therapy  Leroy Libman 04/08/2012, 11:06 AM

## 2012-04-08 NOTE — Progress Notes (Signed)
Nutrition Follow-up/Calorie Count Follow-Up  Day 1: 800 kcal + 31 grams protein Day 2: 450 kcal + 14 grams protein  On average over the past 48 hours, pt is consuming 37% of minimum estimated calorie needs and 32% of minimum estimated protein needs; however will note that SLP reports cognition and initiation is improving with each day. Noted ritalin dosage increased as well.  Discussed poor PO intake with PA, Dan. PA states he will relay information to primary MD.   Diet Order:  Dysphagia 3 diet with thin liquids Supplement: Ensure Complete PO BID, Ensure Pudding PO   Meds: Scheduled Meds:    . amLODipine  10 mg Oral Daily  . antiseptic oral rinse  15 mL Mouth Rinse QID  . cloNIDine  0.3 mg Oral Q8H  . feeding supplement  237 mL Oral BID BM  . hydrALAZINE  100 mg Oral Q8H  . labetalol  400 mg Oral Q8H  . levETIRAcetam  500 mg Oral Q12H  . losartan  25 mg Oral BID  . methylphenidate  10 mg Oral BID WC  . multivitamin with minerals  1 tablet Oral Daily  . senna-docusate  1 tablet Oral BID  . spironolactone  25 mg Oral Daily  . DISCONTD: methylphenidate  5 mg Oral BID WC   Continuous Infusions:  PRN Meds:.acetaminophen, ondansetron (ZOFRAN) IV, ondansetron, polyethylene glycol, sorbitol  Labs:  CMP     Component Value Date/Time   NA 138 04/06/2012 0500   K 4.8 04/06/2012 0500   CL 102 04/06/2012 0500   CO2 24 04/06/2012 0500   GLUCOSE 108* 04/06/2012 0500   BUN 19 04/06/2012 0500   CREATININE 1.07 04/06/2012 0500   CALCIUM 9.8 04/06/2012 0500   PROT 6.7 04/06/2012 0500   ALBUMIN 3.0* 04/06/2012 0500   AST 11 04/06/2012 0500   ALT 11 04/06/2012 0500   ALKPHOS 83 04/06/2012 0500   BILITOT 0.2* 04/06/2012 0500   GFRNONAA 59* 04/06/2012 0500   GFRAA 69* 04/06/2012 0500    Intake/Output Summary (Last 24 hours) at 04/08/12 0850 Last data filed at 04/08/12 0800  Gross per 24 hour  Intake    120 ml  Output   1650 ml  Net  -1530 ml  BM on 6/15  Weight Status:  162 lb (6/17)  Body  mass index is 30.62 kg/(m^2). Obese Class I  Estimated needs:  1700 - 1900 kcal, 70 - 85 grams protein, 1.8 - 2 liters daily  Nutrition Dx:  Inadequate oral intake r/t lethargy and poor attention AEB intake of <50% of meals. Persists.  Goal:  Pt to consume at least 90% of estimated needs. Unmet.  Intervention:   1. Discontinue kcal count 2. Will add additional supplements, Ensure Pudding PO BID and 30 ml Prostat PO BID. If pt refuses these supplements and/or intake/initiation/attention remains consistently suboptimal, strongly recommend initiation of short-term nutrition support.  If nutrition support desired, recommend nocturnal TF. Initiate Jevity 1.2 @ 10 ml/hr, advance by 10 ml/hr every 10 hours or as tolerated, to goal of 60 ml/hr x 12 hours (7p - 7a). This regimen will provide: 864 kcal, 40 grams protein, 281 ml free water and meets 51% kcal needs and 57% protein needs.    Monitor:  weights, labs, PO intake, I/O's  Asencion Partridge Pager #:  [phone]

## 2012-04-08 NOTE — Plan of Care (Signed)
Problem: RH BLADDER ELIMINATION Goal: RH STG MANAGE BLADDER WITH ASSISTANCE STG Manage Bladder With moderate Assistance  Outcome: Not Applicable Date Met:  [id number] Foley remains intact at this time. Goal: RH STG MANAGE BLADDER WITH MEDICATION WITH ASSISTANCE STG Manage Bladder With Medication With minimal Assistance.  Outcome: Not Applicable Date Met:  [id number] Foley intact at this time. Goal: RH OTHER STG BLADDER ELIMINATION GOALS W/ASSIST Other STG Bladder Elimination Goals With minimal Assistance  Outcome: Not Applicable Date Met:  [id number] Foley intact at this time .

## 2012-04-08 NOTE — Progress Notes (Signed)
Speech Language Pathology Daily Session Note [facility]  Patient Details  Name: Kenysha Laverdure MRN: [id number] Date of Birth: 21-Nov-1960  Today's Date: 04/08/2012 Time: 1140-1210 Time Calculation (min): 30 min  Short Term Goals: Week 1: SLP Short Term Goal 1 (Week 1): Pt will utilize enviornmental and contextual cues to increase orientation to date and time with Max A verbal and visual cues.  SLP Short Term Goal 2 (Week 1): Pt will sustain attention to a functional task for ~2 minutes with Max A verbal and tactile cues.  SLP Short Term Goal 3 (Week 1): Pt will demonstrate initiation of self-feeding task with Max A verbal and tactile cues.  SLP Short Term Goal 4 (Week 1): Pt will recall biographical information with Max A semantic and verbal cues.   Skilled Therapeutic Interventions: Pt seen in [place] with focus on initiation and attention to self-feeding. Pt required Max A verbal, tactile and visual cueing to initiate self-feeding of 6 bites and required Max A for social interaction with other group members.  Pt's overall PO intake continues to be less than 25% but overall improved today.    FIM:  Comprehension Comprehension Mode: Auditory Comprehension: 3-Understands basic 50 - 74% of the time/requires cueing 25 - 50%  of the time Expression Expression Mode: Verbal Expression: 2-Expresses basic 25 - 49% of the time/requires cueing 50 - 75% of the time. Uses single words/gestures. Social Interaction Social Interaction: 2-Interacts appropriately 25 - 49% of time - Needs frequent redirection. Problem Solving Problem Solving: 1-Solves basic less than 25% of the time - needs direction nearly all the time or does not effectively solve problems and may need a restraint for safety Memory Memory: 1-Recognizes or recalls less than 25% of the time/requires cueing greater than 75% of the time FIM - Eating Eating Activity: 4: Helper occasionally brings food to mouth  Pain Pain  Assessment Pain Assessment: No/denies pain  Therapy/Group: Dysphagia Group  Treyveon Mochizuki 04/08/2012, 1:44 PM

## 2012-04-08 NOTE — Progress Notes (Signed)
Occupational Therapy Session Note  Patient Details  Name: [name] MRN: [id number] Date of Birth: 1961-03-03  Today's Date: 04/08/2012 Time: 1130-1140 Time Calculation (min): 10 min   Skilled Therapeutic Interventions/Progress Updates: Patient participated in [place] today to address attention and participation with basic self care activity.  Patient lethargic, and with significantly delayed responses to questions / cues.  Patient with severely limited intake, although per SLP, showing steady improvement.       Therapy Documentation Precautions:  Precautions Precautions: Fall Restrictions Weight Bearing Restrictions: No    Pain: Pain Assessment Pain Assessment: No/denies pain    See FIM for current functional status  Therapy/Group: Group Therapy  Mariah Milling 04/08/2012, 2:10 PM

## 2012-04-09 DIAGNOSIS — I609 Nontraumatic subarachnoid hemorrhage, unspecified: Secondary | ICD-10-CM

## 2012-04-09 DIAGNOSIS — Z5189 Encounter for other specified aftercare: Secondary | ICD-10-CM

## 2012-04-09 DIAGNOSIS — G911 Obstructive hydrocephalus: Secondary | ICD-10-CM

## 2012-04-09 DIAGNOSIS — I10 Essential (primary) hypertension: Secondary | ICD-10-CM

## 2012-04-09 MED ORDER — BISACODYL 10 MG RE SUPP
10.0000 mg | Freq: Every day | RECTAL | Status: DC | PRN
  Administered 2012-04-10: 10 mg via RECTAL
  Filled 2012-04-09: qty 1

## 2012-04-09 MED ORDER — MEGESTROL ACETATE 400 MG/10ML PO SUSP
400.0000 mg | Freq: Two times a day (BID) | ORAL | Status: DC
Start: 2012-04-09 — End: 2012-04-12
  Filled 2012-04-09 (×9): qty 10

## 2012-04-09 NOTE — Progress Notes (Signed)
Speech Language Pathology Daily Session Note [facility]   Patient Details  Name: [name] MRN: [id number] Date of Birth: [birth year]  Today's Date: 04/09/2012 Time: [id number] Time Calculation (min): 30 min  Short Term Goals: Week 1: SLP Short Term Goal 1 (Week 1): Pt will utilize enviornmental and contextual cues to increase orientation to date and time with Max A verbal and visual cues.  SLP Short Term Goal 2 (Week 1): Pt will sustain attention to a functional task for ~2 minutes with Max A verbal and tactile cues.  SLP Short Term Goal 3 (Week 1): Pt will demonstrate initiation of self-feeding task with Max A verbal and tactile cues.  SLP Short Term Goal 4 (Week 1): Pt will recall biographical information with Max A semantic and verbal cues.   Skilled Therapeutic Interventions: Treatment focus on initiation and attention to self-feeding. Pt required hand over hand assist with 75% of meal for initiation of meal. Pt with flat affect and minimal social interaction or verbal expression throughout session. Pt consumed 50% of dessert.    FIM:  Comprehension Comprehension Mode: Auditory Comprehension: 1-Understands basic less than 25% of the time/requires cueing 75% of the time Expression Expression Mode: Verbal Expression: 1-Expresses basis less than 25% of the time/requires cueing greater than 75% of the time. Social Interaction Social Interaction: 1-Interacts appropriately less than 25% of the time. May be withdrawn or combative. Problem Solving Problem Solving: 1-Solves basic less than 25% of the time - needs direction nearly all the time or does not effectively solve problems and may need a restraint for safety Memory Memory: 1-Recognizes or recalls less than 25% of the time/requires cueing greater than 75% of the time FIM - Eating Eating Activity: 4: Helper occasionally scoops food on utensil  Pain Pain Assessment Pain Assessment: No/denies pain  Therapy/Group: Dysphagia  Group  Demont Linford 04/09/2012, 4:38 PM

## 2012-04-09 NOTE — Progress Notes (Signed)
Physical Therapy Note  Patient Details  Name: [name] MRN: [id number] Date of Birth: 1961/10/05 Today's Date: 04/09/2012  14:30-15:15 individual therapy pt complained of pain in stomach when sitting on mat. Nurse made aware.  Performed bed mobility with max assist to initiate movement and for technique max vc that pt did not follow wihtout facilitation stand pivot transfers x 3 with max assist with facilitation for weight shift to aid pt advancing LEs. Performed some reaching while on elbow with in limited BOS for attempts to strengthen trunk. Poor effort with reaching with pt frequently stating she just wanted to lay down. Pt was assisted back to bed at the end of the session with max assist for sit to supine. Pt was positioned in supine in midline with gentle stretching to neck.    Erle Crocker 04/09/2012, 4:16 PM

## 2012-04-09 NOTE — Progress Notes (Signed)
Pt very drowsy and alert to self only all day. This morning pt took one bite of her AS with medications and refused the rest. Pt has very poor appetite with very low to no input of fluids or solids.  Daughter came in this afternoon. Discussed her status and lack of nutrients and medications with daughter. While she was here, we attempted to give her her medications with only pt taking half of crushed meds refusing the rest.  Pt takes a few minutes to swallow puree with pocking evident.  Last BM was documented for 6/16.  Pt unable to answer when last BM was. Suppository planned to be given after therapy.  BP checked at 1545 with a result of 201/106 pulse of 65. Notified Silvestre Mesi, PA. No new orders at this time. Pt has been refusing all medications or taken a small portion which included multiple BP medications. Rechecked BP at 1640 it was 187/86 manually.  Family (sister and daughter) visiting pt. They are very emotional and concerned with status of pt. I discussed situation and comforted family.  Family feed pt and she ate 40% of dinner and 384ml of fluid. Family stated she needs extra time and not to feel rushed. Talked with family an explained importance of participating in her care during day. Will cont. To monitor pt.

## 2012-04-09 NOTE — Progress Notes (Signed)
Patient ID: [name], female   DOB: 1961/07/28, 51 y.o.   MRN: [id number] Subjective/Complaints: No problems over night. Denies pain. More alert today. "im at the store". Not eating much Ros otherwise negative Objective: Vital Signs: Blood pressure 170/79, pulse 83, temperature 99 F (37.2 C), temperature source Oral, resp. rate 18, height 5\' 1"  (1.549 m), weight 73.5 kg (162 lb 0.6 oz), SpO2 100.00%. No results found. No results found for this basename: WBC:2,HGB:2,HCT:2,PLT:2 in the last 72 hours No results found for this basename: NA:2,K:2,CL:2,CO2:2,GLUCOSE:2,BUN:2,CREATININE:2,CALCIUM:2 in the last 72 hours CBG (last 3)   Basename 04/07/12 1635  GLUCAP 110*    Wt Readings from Last 3 Encounters:  04/07/12 73.5 kg (162 lb 0.6 oz)  04/05/12 73.4 kg (161 lb 13.1 oz)  04/05/12 73.4 kg (161 lb 13.1 oz)    Physical Exam:  General appearance: no distress and slowed mentation Head: Normocephalic, without obvious abnormality, atraumatic Eyes: conjunctivae/corneas clear. PERRL, EOM's intact. Fundi benign. Ears: normal TM's and external ear canals both ears Nose: Nares normal. Septum midline. Mucosa normal. No drainage or sinus tenderness. Throat: lips, mucosa, and tongue normal; teeth and gums normal Neck: no adenopathy, no carotid bruit, no JVD, supple, symmetrical, trachea midline and thyroid not enlarged, symmetric, no tenderness/mass/nodules Back: symmetric, no curvature. ROM normal. No CVA tenderness. Resp: clear to auscultation bilaterally Cardio: regular rate and rhythm, S1, S2 normal, no murmur, click, rub or gallop GI: soft, non-tender; bowel sounds normal; no masses,  no organomegaly Extremities: extremities normal, atraumatic, no cyanosis or edema Pulses: 2+ and symmetric Skin: Skin color, texture, turgor normal. No rashes or lesions Neurologic: awake, slow to process. Moves all 4's nearly equal, perhaps quicker to move right still. Sensation grossly normal. No focal CN  signs. Poor insight and awarness. Speech low volume, monotone Incision/Wound: incision clean with staples, no drainage   Assessment/Plan: 1. Functional deficits secondary to [facility] with hydrocephalus s/p VP shunt which require 3+ hours per day of interdisciplinary therapy in a comprehensive inpatient rehab setting. Physiatrist is providing close team supervision and 24 hour management of active medical problems listed below. Physiatrist and rehab team continue to assess barriers to discharge/monitor patient progress toward functional and medical goals. FIM: FIM - Bathing Bathing Steps Patient Completed: Chest;Right Arm;Left Arm;Abdomen Bathing: 2: Max-Patient completes 3-4 87f 10 parts or 25-49%  FIM - Upper Body Dressing/Undressing Upper body dressing/undressing: 0: Wears gown/pajamas-no public clothing FIM - Lower Body Dressing/Undressing Lower body dressing/undressing: 0: Wears Interior and spatial designer     FIM - Air cabin crew Transfers: 0-Activity did not occur  FIM - Control and instrumentation engineer Devices: Adult nurse Transfer: 4: Sit > Supine: Min A (steadying pt. > 75%/lift 1 leg)  FIM - Locomotion: Wheelchair Distance: 45' Locomotion: Wheelchair: 1: Travels less than 50 ft with moderate assistance (Pt: 50 - 74%) FIM - Locomotion: Ambulation Locomotion: Ambulation Assistive Devices: Administrator Ambulation/Gait Assistance: 1: +1 Total assist Locomotion: Ambulation: 1: Two helpers (One person to follow with chair, only able to take 1 step)  Comprehension Comprehension Mode: Auditory Comprehension: 3-Understands basic 50 - 74% of the time/requires cueing 25 - 50%  of the time  Expression Expression Mode: Verbal Expression: 2-Expresses basic 25 - 49% of the time/requires cueing 50 - 75% of the time. Uses single words/gestures.  Social Interaction Social Interaction: 2-Interacts appropriately 25 - 49% of time - Needs frequent  redirection.  Problem Solving Problem Solving: 1-Solves basic less than 25% of the time - needs direction nearly  all the time or does not effectively solve problems and may need a restraint for safety  Memory Memory: 1-Recognizes or recalls less than 25% of the time/requires cueing greater than 75% of the time  1. 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 DVT Prophylaxis/Anticoagulation: SCDs. Monitor for DVT  3. Seizure prophylaxis. Keppra 500 mg every 12 hours. Monitor for any seizure activity  4. Dysphasia. Diet been advanced to mechanical soft thin liquids. Monitor for any signs of aspiration  5. Hypertension. Norvasc 10 mg daily, clonidine 0.3 mg every 8 hours, hydralazine 100 mg every 8 hours, labetalol 400 mg 3 times a day, Cozaar 25 mg twice a day and Aldactone 25 mg daily  -bp showing improvement  - Discuss with cardiology services plan to check renal arteries for any stenosis.  6. Arousal: maximize sleep wake cycle. Ritalin trial 7. FEN:- poor intake  -megace trial  -recheck labs tomorrow  LOS (Days) 4 A FACE TO FACE EVALUATION WAS PERFORMED  [name] T 04/09/2012, 7:10 AM

## 2012-04-09 NOTE — Progress Notes (Signed)
Speech Language Pathology Daily Session Note  Patient Details  Name: Asata Migliore MRN: [id number] Date of Birth: 04-04-1961  Today's Date: 04/09/2012 Time: 0800-0850 Time Calculation (min): 50 min  Short Term Goals: Week 1: SLP Short Term Goal 1 (Week 1): Pt will utilize enviornmental and contextual cues to increase orientation to date and time with Max A verbal and visual cues.  SLP Short Term Goal 2 (Week 1): Pt will sustain attention to a functional task for ~2 minutes with Max A verbal and tactile cues.  SLP Short Term Goal 3 (Week 1): Pt will demonstrate initiation of self-feeding task with Max A verbal and tactile cues.  SLP Short Term Goal 4 (Week 1): Pt will recall biographical information with Max A semantic and verbal cues.   Skilled Therapeutic Interventions: Skilled intervention with treatment focus on initiation and attention to a functional task. Pt awake upon clinician entering room and provided an appropriate social response to clinician, however, pt refused medications this morning and SLP unable to encourage despite multimodal cueing and total A.  Pt consumed 1 bite of oatmeal with Max hand over hand assist for self- feeding. Pt refused remainder of meal. Pt required Max A verbal, visual, contextual and tactile cues for initiation, problem solving and sequencing of functional task of teeth brushing.  Pt with minimal verbal expression and spent reminder of session with eyes closed. Clinician unable to engage pt in functional conversation in regards to orientation, biographical information, etc.    FIM:  Comprehension Comprehension Mode: Auditory Comprehension: 2-Understands basic 25 - 49% of the time/requires cueing 51 - 75% of the time Expression Expression: 2-Expresses basic 25 - 49% of the time/requires cueing 50 - 75% of the time. Uses single words/gestures. Social Interaction Social Interaction: 1-Interacts appropriately less than 25% of the time. May be withdrawn or  combative. Problem Solving Problem Solving: 1-Solves basic less than 25% of the time - needs direction nearly all the time or does not effectively solve problems and may need a restraint for safety Memory Memory: 1-Recognizes or recalls less than 25% of the time/requires cueing greater than 75% of the time FIM - Eating Eating Activity: 1: Helper feeds patient  Pain Pain Assessment Pain Assessment: 0-10 Pain Score:   5 Faces Pain Scale:  ("pt states she is in no pain" ) Pain Type: Acute pain Pain Location: Neck Pain Orientation: Right;Lateral;Anterior Pain Descriptors: Aching;Contraction;Sore Pain Intervention(s): Repositioned;Other (Comment) (kinesio tape)  Therapy/Group: Individual Therapy  Scout Gumbs, [place] 04/09/2012, 11:11 AM

## 2012-04-09 NOTE — Progress Notes (Signed)
Occupational Therapy Session Note  Patient Details  Name: [name] MRN: [id number] Date of Birth: November 04, 1960  Today's Date: 04/09/2012 Time: 1000-1100 Time Calculation (min): 60 min  Short Term Goals: Week 1:  OT Short Term Goal 1 (Week 1): Pt will perform basic transfer (bed, chair, 3:1) with mod A 75% of the time OT Short Term Goal 2 (Week 1): Pt will sustain attention to a familiar task for 2 minutes with max A OT Short Term Goal 3 (Week 1): Pt will don shirt with minA with mod verbal cues OT Short Term Goal 4 (Week 1): Pt will perform sit to stand with mod A with max cuing for initiation  Skilled Therapeutic Interventions/Progress Updates:    Attempted to engage in grooming activities and UB bathing activities at sink.  Pt able to keep eyes open but would not/unable to actively participate in tasks.  Pt c/o pain/tightness in right neck (possible torticollis).  Therapist provided stretching and applied kinesio tape to alleviate discomfort.  Pt did not interact with therapist throughout session except to say that she was cold.  After approx 5 minutes patient again stated that she was cold and could not feel the heat.  Pt still does not have any clothing and is wearing hospital gowns.  Focus on active participation, arousal, and attention to task.  Therapy Documentation Precautions:  Precautions Precautions: Fall Restrictions Weight Bearing Restrictions: No   Pain: Pain Assessment Pain Assessment: 0-10 Pain Score:   5 Pain Type: Acute pain Pain Location: Neck Pain Orientation: Right;Lateral;Anterior Pain Descriptors: Aching;Contraction;Sore Pain Intervention(s): Repositioned;Other (Comment) (kinesio tape)  See FIM for current functional status  Therapy/Group: Individual Therapy  Leroy Libman 04/09/2012, 11:04 AM

## 2012-04-09 NOTE — Care Management Note (Signed)
Requested pt's daughter to bring clothes for pt. She plans to bring tomorrow. Noted LBM 04/03/12, RN states she will try giving suppository this evening. Pt continues to refuse some meds and takes very little po; MD aware. Labs ordered, Magace ordered, Ritalin increased.

## 2012-04-09 NOTE — Progress Notes (Signed)
Occupational Therapy Session Note  Patient Details  Name: [name] MRN: [id number] Date of Birth: 12/06/1960  Today's Date: 04/09/2012 Time: 1130-1145 Time Calculation (min): 15 min  Skilled Therapeutic Interventions/Progress Updates: Patient participated with much encouragement and near constant cueing in [employer].  Patient's verbal and motor responses significantly delayed.  Severe initiation deficit.  Patient remains lethargic, although eyes open entire meal.     Therapy Documentation Precautions:  Precautions Precautions: Fall Restrictions Weight Bearing Restrictions: No   Pain: Reports no pain  See FIM for current functional status  Therapy/Group: Group Therapy  Mariah Milling 04/09/2012, 12:27 PM

## 2012-04-10 LAB — BASIC METABOLIC PANEL
Chloride: 100 mEq/L (ref 96–112)
Creatinine, Ser: 1.34 mg/dL — ABNORMAL HIGH (ref 0.50–1.10)
GFR calc Af Amer: 53 mL/min — ABNORMAL LOW (ref >90)
GFR calc non Af Amer: 45 mL/min — ABNORMAL LOW (ref >90)
Potassium: 4.5 mEq/L (ref 3.5–5.1)

## 2012-04-10 LAB — PREALBUMIN: Prealbumin: 25.3 mg/dL (ref 17.0–34.0)

## 2012-04-10 MED ORDER — LOSARTAN POTASSIUM 50 MG PO TABS
50.0000 mg | ORAL_TABLET | Freq: Two times a day (BID) | ORAL | Status: DC
  Filled 2012-04-10 (×7): qty 1

## 2012-04-10 NOTE — Progress Notes (Signed)
Occupational Therapy Session Note  Patient Details  Name: [name] MRN: [id number] Date of Birth: 12/29/1960  Today's Date: 04/10/2012 Time:  SESSION 1  11:35-12:05              SESSION 2    14:42- 15:25  Short Term Goals: Week 1:  OT Short Term Goal 1 (Week 1): Pt will perform basic transfer (bed, chair, 3:1) with mod A 75% of the time OT Short Term Goal 2 (Week 1): Pt will sustain attention to a familiar task for 2 minutes with max A OT Short Term Goal 3 (Week 1): Pt will don shirt with minA with mod verbal cues OT Short Term Goal 4 (Week 1): Pt will perform sit to stand with mod A with max cuing for initiation  Skilled Therapeutic Interventions/Progress Updates:    SESSION 1:  Pt participated in self feeding group with focus on initiation and selective attention.  Pt overall needs max instructional cueing to initiate and carry out all feeding.  At times needed assistance from therapist to scoop food on the fork, then she could bring it to her mouth.  Only ate 4-5 bites of her main meal.    SESSION: 2  Worked on toilet transfers and toileting using elongated 3:1 over the toilet.  Max assist for stand pivot transfer to the toilet without an assistive device.  Total assist for toilet hygiene sit to stand and clothing management.  Utilized walker for transfer back to wheelchair in front of the sink with mod facilitation. In sitting pt needed max instructional cueing and min facilitation to initiate and complete washing her hands.    Therapy Documentation Precautions:  Precautions Precautions: Fall Precaution Comments: decreased initiation and safety awareness Restrictions Weight Bearing Restrictions: No  Pain: Pain Assessment Pain Assessment: No/denies pain Pain Score: 0-No pain Pain Type: Acute pain Pain Location: Neck Pain Orientation: Right Pain Descriptors: Aching Multiple Pain Sites: Yes   See FIM for current functional status  Therapy/Group: Group Therapy Individual  therapy for second session  Jaking Thayer OTR/L 04/10/2012, 3:43 PM

## 2012-04-10 NOTE — Progress Notes (Signed)
Speech Language Pathology Daily Session Note  Patient Details  Name: [name] MRN: [id number] Date of Birth: [birth year]  Today's Date: 04/10/2012 Time: 1400-1420 Time Calculation (min): 20 min (Missed 10 minutes secondary to illness)   Short Term Goals: Week 1: SLP Short Term Goal 1 (Week 1): Pt will utilize enviornmental and contextual cues to increase orientation to date and time with Max A verbal and visual cues.  SLP Short Term Goal 2 (Week 1): Pt will sustain attention to a functional task for ~2 minutes with Max A verbal and tactile cues.  SLP Short Term Goal 3 (Week 1): Pt will demonstrate initiation of self-feeding task with Max A verbal and tactile cues.  SLP Short Term Goal 4 (Week 1): Pt will recall biographical information with Max A semantic and verbal cues.   Skilled Therapeutic Interventions: Treatment focus on initiation of verbal expression and social interaction. Pt utilizing only gestures for yes/no responses and not responding to open-ended questions. Pt then gestured to clinician she had to vomit (unable to determine if pt was expelling medications she was pocketing).  Pt reported her stomach felt "empty" but refused any PO intake.  RN notified of vomiting episode and pt placed back to bed.    FIM:  Comprehension Comprehension Mode: Auditory Comprehension: 2-Understands basic 25 - 49% of the time/requires cueing 51 - 75% of the time Expression Expression: 1-Expresses basis less than 25% of the time/requires cueing greater than 75% of the time. Social Interaction Social Interaction: 1-Interacts appropriately less than 25% of the time. May be withdrawn or combative. Problem Solving Problem Solving: 1-Solves basic less than 25% of the time - needs direction nearly all the time or does not effectively solve problems and may need a restraint for safety Memory Memory: 1-Recognizes or recalls less than 25% of the time/requires cueing greater than 75% of the time FIM -  Eating Eating Activity: 4: Helper occasionally scoops food on utensil  Pain Pain Assessment Pain Assessment: No/denies pain  Therapy/Group: Individual Therapy  [name] 04/10/2012, 2:51 PM

## 2012-04-10 NOTE — Progress Notes (Signed)
Speech Language Pathology Daily Session Note [place]  Patient Details  Name: [name] MRN: [id number] Date of Birth: 07/12/1961  Today's Date: 04/10/2012 Time: 1205-1225 Time Calculation (min): 20 min  Short Term Goals: Week 1: SLP Short Term Goal 1 (Week 1): Pt will utilize enviornmental and contextual cues to increase orientation to date and time with Max A verbal and visual cues.  SLP Short Term Goal 2 (Week 1): Pt will sustain attention to a functional task for ~2 minutes with Max A verbal and tactile cues.  SLP Short Term Goal 3 (Week 1): Pt will demonstrate initiation of self-feeding task with Max A verbal and tactile cues.  SLP Short Term Goal 4 (Week 1): Pt will recall biographical information with Max A semantic and verbal cues.   Skilled Therapeutic Interventions: Treatment focus on initiation and attention to self-feeding. Pt required Max encouragement for PO intake. Pt initiated meal with Mod A verbal and visual cues, however, pt pocketing food and required contextual cues to expel bolus from oral cavity. Pt then consumed 100% of ice cream with Max A tactile and visual cues for initiation of self-feeding.    FIM:  Comprehension Comprehension Mode: Auditory Comprehension: 2-Understands basic 25 - 49% of the time/requires cueing 51 - 75% of the time Expression Expression: 1-Expresses basis less than 25% of the time/requires cueing greater than 75% of the time. Social Interaction Social Interaction: 1-Interacts appropriately less than 25% of the time. May be withdrawn or combative. Problem Solving Problem Solving: 1-Solves basic less than 25% of the time - needs direction nearly all the time or does not effectively solve problems and may need a restraint for safety Memory Memory: 1-Recognizes or recalls less than 25% of the time/requires cueing greater than 75% of the time FIM - Eating Eating Activity: 4: Helper occasionally scoops food on utensil  Pain Pain  Assessment Pain Assessment: No/denies pain  Therapy/Group: Dysphagia Group  [name] 04/10/2012, 2:45 PM

## 2012-04-10 NOTE — Progress Notes (Signed)
Physical Therapy Note  Patient Details  Name: [name] MRN: [id number] Date of Birth: 16-Apr-1961 Today's Date: 04/10/2012  1300-1355 (55 minutes) individual Pain: pt reports headache/nurse notified/meds given  Focus of treatment: transfer training/ sit to stand / standing tolerance Treatment: sit to stand from wc mod assist; stand/pivot transfer mod assist; sit to stand to back of wc X 2 for < 1 minute; standing (standing frame) for standing tolerance X 2 < 1.5 minutes before c/o fatigue/ bilateral LE tremors ; gait EVA walker 3 steps mod assist with decreasing trunk extension (followed by wc for safety) /wc mobility 10 feet X 3  with tactile cues for hand placement/ distance limited by decreased attention to task . Pt unable to change direction.  1530-1555 (25 minutes) individual Pain: c/o headache/premedicated Focus of activity: transfer training/ therapeutic activity focused on activity tolerance Treatment: Transfer - sit to stand from wc min/mod assist (variable) ; stand/pivot with RW min assist with decreased step initiation on left; Nustep (ergonometer) X 5 minutes with X 2 tactile cues to continue with task.   [name],JIM 04/10/2012, 1:30 PM

## 2012-04-10 NOTE — Progress Notes (Signed)
Patient ID: [name], female   DOB: [birth year], 51 y.o.   MRN: [id number] Patient ID: [name], female   DOB: [birth year], 51 y.o.   MRN: [id number] Subjective/Complaints: No problems over night. Denies pain. More alert today;  is still slightly lethargic.   Ros otherwise negative  BP Readings from Last 3 Encounters:  04/10/12 173/95  04/05/12 175/86  04/05/12 175/86   Chest is clear.  Exam normal heart sounds without murmurs abdomen soft nontender. Foley catheter in place. Extremities no edema TED stockings noted left leg  Blood pressure remains elevated in spite of multiple drugs. Will titrate losartan to 50 twice a day  Objective: Vital Signs: Blood pressure 173/95, pulse 80, temperature 98.6 F (37 C), temperature source Oral, resp. rate 18, height 5\' 1"  (1.549 m), weight 73.5 kg (162 lb 0.6 oz), SpO2 99.00%. No results found. No results found for this basename: WBC:2,HGB:2,HCT:2,PLT:2 in the last 72 hours  Basename 04/10/12 0500  NA 137  K 4.5  CL 100  CO2 25  GLUCOSE 102*  BUN 17  CREATININE 1.34*  CALCIUM 10.3   CBG (last 3)   Basename 04/07/12 1635  GLUCAP 110*    Wt Readings from Last 3 Encounters:  04/07/12 73.5 kg (162 lb 0.6 oz)  04/05/12 73.4 kg (161 lb 13.1 oz)  04/05/12 73.4 kg (161 lb 13.1 oz)    Physical Exam:  General appearance: no distress and slowed mentation Head: Normocephalic, without obvious abnormality, atraumatic Eyes: conjunctivae/corneas clear. PERRL, EOM's intact. Fundi benign. Ears: normal TM's and external ear canals both ears Nose: Nares normal. Septum midline. Mucosa normal. No drainage or sinus tenderness. Throat: lips, mucosa, and tongue normal; teeth and gums normal Neck: no adenopathy, no carotid bruit, no JVD, supple, symmetrical, trachea midline and thyroid not enlarged, symmetric, no tenderness/mass/nodules Back: symmetric, no curvature. ROM normal. No CVA tenderness. Resp: clear to auscultation bilaterally Cardio:  regular rate and rhythm, S1, S2 normal, no murmur, click, rub or gallop GI: soft, non-tender; bowel sounds normal; no masses,  no organomegaly Extremities: extremities normal, atraumatic, no cyanosis or edema Pulses: 2+ and symmetric Skin: Skin color, texture, turgor normal. No rashes or lesions Neurologic: awake, slow to process. Moves all 4's nearly equal, perhaps quicker to move right still. Sensation grossly normal. No focal CN signs. Poor insight and awarness. Speech low volume, monotone Incision/Wound: incision clean with staples, no drainage   Assessment/Plan: 1. Functional deficits secondary to [facility] with hydrocephalus s/p VP shunt which require 3+ hours per day of interdisciplinary therapy in a comprehensive inpatient rehab setting. Physiatrist is providing close team supervision and 24 hour management of active medical problems listed below. Physiatrist and rehab team continue to assess barriers to discharge/monitor patient progress toward functional and medical goals. FIM: FIM - Bathing Bathing Steps Patient Completed: Chest;Right Arm;Left Arm;Abdomen Bathing: 2: Max-Patient completes 3-4 72f 10 parts or 25-49%  FIM - Upper Body Dressing/Undressing Upper body dressing/undressing: 0: Wears gown/pajamas-no public clothing FIM - Lower Body Dressing/Undressing Lower body dressing/undressing: 0: Wears Interior and spatial designer     FIM - Air cabin crew Transfers: 0-Activity did not occur  FIM - Control and instrumentation engineer Devices: Adult nurse Transfer: 4: Sit > Supine: Min A (steadying pt. > 75%/lift 1 leg)  FIM - Locomotion: Wheelchair Distance: 45' Locomotion: Wheelchair: 1: Travels less than 50 ft with moderate assistance (Pt: 50 - 74%) FIM - Locomotion: Ambulation Locomotion: Ambulation Assistive Devices: Walker - Rolling Ambulation/Gait Assistance: 1: +1 Total assist Locomotion: Ambulation:  1: Two helpers (One person to follow with  chair, only able to take 1 step)  Comprehension Comprehension Mode: Auditory Comprehension: 1-Understands basic less than 25% of the time/requires cueing 75% of the time  Expression Expression Mode: Verbal Expression: 1-Expresses basis less than 25% of the time/requires cueing greater than 75% of the time.  Social Interaction Social Interaction: 1-Interacts appropriately less than 25% of the time. May be withdrawn or combative.  Problem Solving Problem Solving: 1-Solves basic less than 25% of the time - needs direction nearly all the time or does not effectively solve problems and may need a restraint for safety  Memory Memory: 1-Recognizes or recalls less than 25% of the time/requires cueing greater than 75% of the time  1. 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 DVT Prophylaxis/Anticoagulation: SCDs. Monitor for DVT  3. Seizure prophylaxis. Keppra 500 mg every 12 hours. Monitor for any seizure activity  4. Dysphasia. Diet been advanced to mechanical soft thin liquids. Monitor for any signs of aspiration  5. Hypertension. Norvasc 10 mg daily, clonidine 0.3 mg every 8 hours, hydralazine 100 mg every 8 hours, labetalol 400 mg 3 times a day, Cozaar 25 mg twice a day and Aldactone 25 mg daily  -bp showing improvement  - Discuss with cardiology services plan to check renal arteries for any stenosis.  6. Arousal: maximize sleep wake cycle. Ritalin trial 7. FEN:- poor intake  -megace trial  -recheck labs tomorrow  LOS (Days) 5 A FACE TO FACE EVALUATION WAS PERFORMED  Nyoka Cowden 04/10/2012, 7:51 AM

## 2012-04-11 MED ORDER — METOPROLOL TARTRATE 1 MG/ML IV SOLN
5.0000 mg | Freq: Four times a day (QID) | INTRAVENOUS | Status: DC
  Administered 2012-04-11: 5 mg via INTRAVENOUS
  Filled 2012-04-11 (×6): qty 5

## 2012-04-11 MED ORDER — CLONIDINE HCL 0.3 MG/24HR TD PTWK
0.3000 mg | MEDICATED_PATCH | TRANSDERMAL | Status: DC
  Administered 2012-04-11: 0.3 mg via TRANSDERMAL
  Filled 2012-04-11 (×2): qty 1

## 2012-04-11 MED ORDER — ENALAPRILAT 1.25 MG/ML IV SOLN
0.6250 mg | Freq: Four times a day (QID) | INTRAVENOUS | Status: DC
  Administered 2012-04-11: 0.625 mg via INTRAVENOUS
  Filled 2012-04-11 (×6): qty 0.5

## 2012-04-11 MED ORDER — ENALAPRILAT 1.25 MG/ML IV SOLN: 0.6250 mg | Freq: Four times a day (QID) | INTRAVENOUS | Status: DC

## 2012-04-11 MED ORDER — NITROGLYCERIN 2 % TD OINT
1.0000 [in_us] | TOPICAL_OINTMENT | Freq: Once | TRANSDERMAL | Status: AC
  Administered 2012-04-12: 1 [in_us] via TOPICAL
  Filled 2012-04-11: qty 30

## 2012-04-11 MED ORDER — DEXTROSE-NACL 5-0.45 % IV SOLN
INTRAVENOUS | Status: DC
  Administered 2012-04-11 (×2): via INTRAVENOUS

## 2012-04-11 NOTE — Progress Notes (Signed)
Physical Therapy Note  Patient Details  Name: [name] MRN: [id number] Date of Birth: [birth year] Today's Date: 04/11/2012  10:30-11:20 individual therapy pt complained of head ache and stomach ache. Nursing made aware.  Sit to supine min assist with mod vc for initiation with rail and HOB elevated. Performed gait 3' to recliner mod assist with assist to shift weight and to advance left foot with a bigger step. Performed counting to help initiate movement. Stand pivot to [facility] mod assist to move walker and help with foot placement. Pt stood x 2 minutes while being cleaned. Pt was oriented to situation and place at the beginning of session that did not carry over to the end of the session.   Erle Crocker 04/11/2012, 12:34 PM

## 2012-04-11 NOTE — Progress Notes (Signed)
Patient continues to not eat. Will not take medications. Doctor aware. Iv started D5 1/2 normal saline. Clonidine patch applied to left arm

## 2012-04-11 NOTE — Progress Notes (Signed)
Patient ID: [name], female   DOB: 08-Apr-1961, 51 y.o.   MRN: [id number] Patient ID: [name], female   DOB: 05-18-1961, 51 y.o.   MRN: [id number] Patient ID: [name], female   DOB: [birth year], 51 y.o.   MRN: [id number] Subjective/Complaints: 6/23.  Remains lethargic and on a D3 diet which she is not tolerating well. By mouth intake remains poor and serum creatinine yesterday increased to 1.34. Patient has had a difficult time handling oral medications and blood pressure remains elevated.  BP Readings from Last 3 Encounters:  04/11/12 188/97  04/05/12 175/86  04/05/12 175/86   Chest is clear.  Cardiovascular no tachycardia Exam normal heart sounds without murmurs abdomen soft nontender. Foley catheter in place. Extremities no edema  We'll support with IV fluids over the next 24 hours and check electrolytes in the morning. We'll place on Catapres patch since she is not tolerating her oral medications well.  Objective: Vital Signs: Blood pressure 188/97, pulse 90, temperature 99.3 F (37.4 C), temperature source Oral, resp. rate 20, height 5\' 1"  (1.549 m), weight 73.5 kg (162 lb 0.6 oz), SpO2 98.00%. No results found. No results found for this basename: WBC:2,HGB:2,HCT:2,PLT:2 in the last 72 hours  Basename 04/10/12 0500  NA 137  K 4.5  CL 100  CO2 25  GLUCOSE 102*  BUN 17  CREATININE 1.34*  CALCIUM 10.3   CBG (last 3)  No results found for this basename: GLUCAP:3 in the last 72 hours  Wt Readings from Last 3 Encounters:  04/07/12 73.5 kg (162 lb 0.6 oz)  04/05/12 73.4 kg (161 lb 13.1 oz)  04/05/12 73.4 kg (161 lb 13.1 oz)    Physical Exam:  General appearance: no distress and slowed mentation Head: Normocephalic, without obvious abnormality, atraumatic Eyes: conjunctivae/corneas clear. PERRL, EOM's intact. Fundi benign. Ears: normal TM's and external ear canals both ears Nose: Nares normal. Septum midline. Mucosa normal. No drainage or sinus tenderness. Throat:  lips, mucosa, and tongue normal; teeth and gums normal Neck: no adenopathy, no carotid bruit, no JVD, supple, symmetrical, trachea midline and thyroid not enlarged, symmetric, no tenderness/mass/nodules Back: symmetric, no curvature. ROM normal. No CVA tenderness. Resp: clear to auscultation bilaterally Cardio: regular rate and rhythm, S1, S2 normal, no murmur, click, rub or gallop GI: soft, non-tender; bowel sounds normal; no masses,  no organomegaly Extremities: extremities normal, atraumatic, no cyanosis or edema Pulses: 2+ and symmetric Skin: Skin color, texture, turgor normal. No rashes or lesions Neurologic: awake, slow to process. Moves all 4's nearly equal, perhaps quicker to move right still. Sensation grossly normal. No focal CN signs. Poor insight and awarness. Speech low volume, monotone Incision/Wound: incision clean with staples, no drainage   Assessment/Plan: 1. Functional deficits secondary to [facility] with hydrocephalus s/p VP shunt which require 3+ hours per day of interdisciplinary therapy in a comprehensive inpatient rehab setting. Physiatrist is providing close team supervision and 24 hour management of active medical problems listed below. Physiatrist and rehab team continue to assess barriers to discharge/monitor patient progress toward functional and medical goals. FIM: FIM - Bathing Bathing Steps Patient Completed: Chest;Right Arm;Left Arm;Abdomen Bathing: 2: Max-Patient completes 3-4 48f 10 parts or 25-49%  FIM - Upper Body Dressing/Undressing Upper body dressing/undressing: 0: Wears gown/pajamas-no public clothing FIM - Lower Body Dressing/Undressing Lower body dressing/undressing: 0: Activity did not occur  FIM - Toileting Toileting: 1: Total-Patient completed zero steps, helper did all 3  FIM - Radio producer Devices: Recruitment consultant Transfers: 2-To  toilet/BSC: Max A (lift and lower assist);3-From toilet/BSC: Mod A (lift or  lower assist)  FIM - Bed/Chair Transfer Bed/Chair Transfer Assistive Devices: Copy: 4: Supine > Sit: Min A (steadying Pt. > 75%/lift 1 leg);2: Bed > Chair or W/C: Max A (lift and lower assist)  FIM - Locomotion: Wheelchair Distance: 45' Locomotion: Wheelchair: 1: Travels less than 50 ft with moderate assistance (Pt: 50 - 74%) FIM - Locomotion: Ambulation Locomotion: Ambulation Assistive Devices: Administrator Ambulation/Gait Assistance: 1: +1 Total assist Locomotion: Ambulation: 1: Two helpers (One person to follow with chair, only able to take 1 step)  Comprehension Comprehension Mode: Auditory Comprehension: 2-Understands basic 25 - 49% of the time/requires cueing 51 - 75% of the time  Expression Expression Mode: Verbal Expression: 1-Expresses basis less than 25% of the time/requires cueing greater than 75% of the time.  Social Interaction Social Interaction: 1-Interacts appropriately less than 25% of the time. May be withdrawn or combative.  Problem Solving Problem Solving: 1-Solves basic less than 25% of the time - needs direction nearly all the time or does not effectively solve problems and may need a restraint for safety  Memory Memory: 1-Recognizes or recalls less than 25% of the time/requires cueing greater than 75% of the time  1. 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 DVT Prophylaxis/Anticoagulation: SCDs. Monitor for DVT  3. Seizure prophylaxis. Keppra 500 mg every 12 hours. Monitor for any seizure activity  4. Dysphasia. Diet been advanced to mechanical soft thin liquids. Monitor for any signs of aspiration  5. Hypertension. Norvasc 10 mg daily, clonidine 0.3 mg every 8 hours, hydralazine 100 mg every 8 hours, labetalol 400 mg 3 times a day, Cozaar 25 mg twice a day and Aldactone 25 mg daily  -bp showing  improvement  - Discuss with cardiology services plan to check renal arteries for any stenosis.  6. Arousal: maximize sleep wake cycle. Ritalin trial 7. FEN:- poor intake  -megace trial  -recheck labs tomorrow  LOS (Days) 6 A FACE TO FACE EVALUATION WAS PERFORMED  Nyoka Cowden 04/11/2012, 7:48 AM

## 2012-04-11 NOTE — Progress Notes (Signed)
At 2040 BP 213/125 with HR 94. Patient continues to refuse all medications and PO intake. Will hold meds in mouth, then spit them out. Paged Dr. Leanne Chang related to BP and continued refusal of meds. Order for enalapril IV, med given at 2141.  At 2230 BP 234/139, HR 96 O2 sat 100 on room air. Complains of headache, but refusing meds. Paged Dr. Leanne Chang with updated BP. Orders for lopressor iv and Nitro paste if SBP > 180 after 2 hours of receiving lopressor. Also paged rapid response nurse to assess patient. Will continue to monitor. Coletta Memos

## 2012-04-12 ENCOUNTER — Inpatient Hospital Stay (HOSPITAL_COMMUNITY): Payer: No Typology Code available for payment source

## 2012-04-12 ENCOUNTER — Encounter (HOSPITAL_COMMUNITY): Payer: Self-pay | Admitting: *Deleted

## 2012-04-12 ENCOUNTER — Inpatient Hospital Stay (HOSPITAL_COMMUNITY)
Admission: AD | Admit: 2012-04-12 | Payer: No Typology Code available for payment source | Source: Other Acute Inpatient Hospital | Admitting: Internal Medicine

## 2012-04-12 ENCOUNTER — Inpatient Hospital Stay (HOSPITAL_COMMUNITY)
Admission: AD | Admit: 2012-04-12 | Discharge: 2012-04-27 | DRG: 683 | Disposition: A | Discharge status: Home Health | Payer: No Typology Code available for payment source | Source: Other Acute Inpatient Hospital | Attending: Internal Medicine | Admitting: Internal Medicine

## 2012-04-12 ENCOUNTER — Encounter (HOSPITAL_COMMUNITY): Payer: Self-pay | Admitting: Radiology

## 2012-04-12 DIAGNOSIS — I5032 Chronic diastolic (congestive) heart failure: Secondary | ICD-10-CM

## 2012-04-12 DIAGNOSIS — Z982 Presence of cerebrospinal fluid drainage device: Secondary | ICD-10-CM

## 2012-04-12 DIAGNOSIS — Z8679 Personal history of other diseases of the circulatory system: Secondary | ICD-10-CM | POA: Diagnosis present

## 2012-04-12 DIAGNOSIS — J9600 Acute respiratory failure, unspecified whether with hypoxia or hypercapnia: Secondary | ICD-10-CM

## 2012-04-12 DIAGNOSIS — I6200 Nontraumatic subdural hemorrhage, unspecified: Secondary | ICD-10-CM

## 2012-04-12 DIAGNOSIS — E872 Acidosis, unspecified: Secondary | ICD-10-CM

## 2012-04-12 DIAGNOSIS — N189 Chronic kidney disease, unspecified: Secondary | ICD-10-CM

## 2012-04-12 DIAGNOSIS — K5909 Other constipation: Secondary | ICD-10-CM | POA: Diagnosis not present

## 2012-04-12 DIAGNOSIS — K5900 Constipation, unspecified: Secondary | ICD-10-CM

## 2012-04-12 DIAGNOSIS — D638 Anemia in other chronic diseases classified elsewhere: Secondary | ICD-10-CM | POA: Diagnosis present

## 2012-04-12 DIAGNOSIS — I10 Essential (primary) hypertension: Secondary | ICD-10-CM

## 2012-04-12 DIAGNOSIS — Z9861 Coronary angioplasty status: Secondary | ICD-10-CM

## 2012-04-12 DIAGNOSIS — I5033 Acute on chronic diastolic (congestive) heart failure: Secondary | ICD-10-CM | POA: Diagnosis present

## 2012-04-12 DIAGNOSIS — I609 Nontraumatic subarachnoid hemorrhage, unspecified: Secondary | ICD-10-CM

## 2012-04-12 DIAGNOSIS — I2510 Atherosclerotic heart disease of native coronary artery without angina pectoris: Secondary | ICD-10-CM

## 2012-04-12 DIAGNOSIS — I129 Hypertensive chronic kidney disease with stage 1 through stage 4 chronic kidney disease, or unspecified chronic kidney disease: Principal | ICD-10-CM | POA: Diagnosis present

## 2012-04-12 DIAGNOSIS — T465X5A Adverse effect of other antihypertensive drugs, initial encounter: Secondary | ICD-10-CM | POA: Diagnosis not present

## 2012-04-12 DIAGNOSIS — Z8673 Personal history of transient ischemic attack (TIA), and cerebral infarction without residual deficits: Secondary | ICD-10-CM

## 2012-04-12 DIAGNOSIS — R339 Retention of urine, unspecified: Secondary | ICD-10-CM | POA: Diagnosis not present

## 2012-04-12 DIAGNOSIS — E7800 Pure hypercholesterolemia, unspecified: Secondary | ICD-10-CM | POA: Diagnosis present

## 2012-04-12 DIAGNOSIS — E46 Unspecified protein-calorie malnutrition: Secondary | ICD-10-CM

## 2012-04-12 DIAGNOSIS — I701 Atherosclerosis of renal artery: Secondary | ICD-10-CM

## 2012-04-12 LAB — COMPREHENSIVE METABOLIC PANEL
ALT: 10 U/L (ref 0–35)
AST: 13 U/L (ref 0–37)
Albumin: 3.5 g/dL (ref 3.5–5.2)
Alkaline Phosphatase: 98 U/L (ref 39–117)
Alkaline Phosphatase: 99 U/L (ref 39–117)
BUN: 8 mg/dL (ref 6–23)
Chloride: 102 mEq/L (ref 96–112)
Chloride: 102 mEq/L (ref 96–112)
Creatinine, Ser: 1.06 mg/dL (ref 0.50–1.10)
GFR calc Af Amer: 70 mL/min — ABNORMAL LOW (ref >90)
Glucose, Bld: 128 mg/dL — ABNORMAL HIGH (ref 70–99)
Potassium: 3.5 mEq/L (ref 3.5–5.1)
Potassium: 3.5 mEq/L (ref 3.5–5.1)
Total Bilirubin: 0.3 mg/dL (ref 0.3–1.2)
Total Bilirubin: 0.3 mg/dL (ref 0.3–1.2)
Total Protein: 7.4 g/dL (ref 6.0–8.3)

## 2012-04-12 LAB — CBC
MCH: 28.5 pg (ref 26.0–34.0)
MCV: 87.2 fL (ref 78.0–100.0)
Platelets: 396 10*3/uL (ref 150–400)
Platelets: 415 10*3/uL — ABNORMAL HIGH (ref 150–400)
RDW: 13.5 % (ref 11.5–15.5)
RDW: 13.5 % (ref 11.5–15.5)
WBC: 9.4 10*3/uL (ref 4.0–10.5)

## 2012-04-12 MED ORDER — SODIUM CHLORIDE 0.45 % IV SOLN
INTRAVENOUS | Status: DC
  Administered 2012-04-12 – 2012-04-13 (×2): 75 mL/h via INTRAVENOUS

## 2012-04-12 MED ORDER — HYDRALAZINE HCL 50 MG PO TABS
50.0000 mg | ORAL_TABLET | Freq: Three times a day (TID) | ORAL | Status: DC
  Filled 2012-04-12 (×3): qty 1

## 2012-04-12 MED ORDER — SPIRONOLACTONE 25 MG PO TABS
25.0000 mg | ORAL_TABLET | Freq: Every day | ORAL | Status: DC
  Filled 2012-04-12: qty 1

## 2012-04-12 MED ORDER — ONDANSETRON HCL 4 MG/2ML IJ SOLN
4.0000 mg | Freq: Four times a day (QID) | INTRAMUSCULAR | Status: DC | PRN
  Administered 2012-04-12: 4 mg via INTRAVENOUS
  Filled 2012-04-12: qty 2

## 2012-04-12 MED ORDER — SODIUM CHLORIDE 0.9 % IJ SOLN: 3.0000 mL | Freq: Two times a day (BID) | INTRAMUSCULAR | Status: DC

## 2012-04-12 MED ORDER — ONDANSETRON HCL 4 MG PO TABS: 4.0000 mg | ORAL_TABLET | Freq: Four times a day (QID) | ORAL | Status: DC | PRN

## 2012-04-12 MED ORDER — MORPHINE SULFATE 2 MG/ML IJ SOLN
2.0000 mg | INTRAMUSCULAR | Status: DC | PRN
  Administered 2012-04-12 – 2012-04-13 (×7): 2 mg via INTRAVENOUS
  Filled 2012-04-12 (×6): qty 1

## 2012-04-12 MED ORDER — NITROGLYCERIN IN D5W 200-5 MCG/ML-% IV SOLN
2.0000 ug/min | INTRAVENOUS | Status: DC
  Administered 2012-04-12: 10 ug/min via INTRAVENOUS
  Administered 2012-04-13: 15 ug/min via INTRAVENOUS
  Filled 2012-04-12 (×2): qty 250

## 2012-04-12 MED ORDER — CARVEDILOL 12.5 MG PO TABS
12.5000 mg | ORAL_TABLET | Freq: Two times a day (BID) | ORAL | Status: DC
  Administered 2012-04-12: 12.5 mg via ORAL
  Filled 2012-04-12 (×3): qty 1

## 2012-04-12 MED ORDER — LABETALOL HCL 5 MG/ML IV SOLN
20.0000 mg | INTRAVENOUS | Status: DC | PRN
  Filled 2012-04-12: qty 4

## 2012-04-12 MED ORDER — LABETALOL HCL 5 MG/ML IV SOLN
20.0000 mg | INTRAVENOUS | Status: DC | PRN
  Administered 2012-04-12 – 2012-04-13 (×5): 20 mg via INTRAVENOUS
  Filled 2012-04-12 (×5): qty 4

## 2012-04-12 MED ORDER — HYDRALAZINE HCL 20 MG/ML IJ SOLN
10.0000 mg | INTRAMUSCULAR | Status: DC
  Administered 2012-04-12 – 2012-04-13 (×7): 10 mg via INTRAVENOUS
  Filled 2012-04-12 (×2): qty 1
  Filled 2012-04-12 (×5): qty 0.5
  Filled 2012-04-12: qty 1
  Filled 2012-04-12: qty 0.5
  Filled 2012-04-12 (×3): qty 1

## 2012-04-12 MED ORDER — LABETALOL HCL 200 MG PO TABS
400.0000 mg | ORAL_TABLET | Freq: Three times a day (TID) | ORAL | Status: DC
  Filled 2012-04-12 (×3): qty 2

## 2012-04-12 MED ORDER — MORPHINE SULFATE 2 MG/ML IJ SOLN
1.0000 mg | Freq: Four times a day (QID) | INTRAMUSCULAR | Status: DC | PRN
  Administered 2012-04-12: 1 mg via INTRAVENOUS
  Filled 2012-04-12: qty 1

## 2012-04-12 MED ORDER — ONDANSETRON HCL 4 MG/2ML IJ SOLN
4.0000 mg | INTRAMUSCULAR | Status: DC | PRN
  Administered 2012-04-12 – 2012-04-22 (×4): 4 mg via INTRAVENOUS
  Filled 2012-04-12 (×4): qty 2

## 2012-04-12 MED ORDER — ACETAMINOPHEN 325 MG PO TABS
650.0000 mg | ORAL_TABLET | Freq: Four times a day (QID) | ORAL | Status: DC | PRN
  Filled 2012-04-12: qty 2

## 2012-04-12 MED ORDER — LABETALOL HCL 5 MG/ML IV SOLN
20.0000 mg | INTRAVENOUS | Status: DC | PRN
  Administered 2012-04-12: 20 mg via INTRAVENOUS
  Filled 2012-04-12: qty 4

## 2012-04-12 MED ORDER — ACETAMINOPHEN 650 MG RE SUPP
650.0000 mg | Freq: Four times a day (QID) | RECTAL | Status: DC | PRN
  Administered 2012-04-22: 650 mg via RECTAL
  Filled 2012-04-12 (×2): qty 1

## 2012-04-12 MED ORDER — MORPHINE SULFATE 2 MG/ML IJ SOLN
INTRAMUSCULAR | Status: AC
  Administered 2012-04-12: 2 mg via INTRAVENOUS
  Filled 2012-04-12: qty 1

## 2012-04-12 MED ORDER — NITROGLYCERIN IN D5W 200-5 MCG/ML-% IV SOLN
2.0000 ug/min | INTRAVENOUS | Status: DC
  Filled 2012-04-12: qty 250

## 2012-04-12 MED ORDER — LOSARTAN POTASSIUM 25 MG PO TABS
25.0000 mg | ORAL_TABLET | Freq: Two times a day (BID) | ORAL | Status: DC
  Filled 2012-04-12 (×2): qty 1

## 2012-04-12 MED ORDER — METOPROLOL TARTRATE 1 MG/ML IV SOLN
5.0000 mg | Freq: Four times a day (QID) | INTRAVENOUS | Status: DC
  Administered 2012-04-12 – 2012-04-13 (×4): 5 mg via INTRAVENOUS
  Filled 2012-04-12 (×8): qty 5

## 2012-04-12 MED ORDER — AMLODIPINE BESYLATE 10 MG PO TABS
10.0000 mg | ORAL_TABLET | Freq: Every day | ORAL | Status: DC
  Filled 2012-04-12: qty 1

## 2012-04-12 MED ORDER — CLONIDINE HCL 0.3 MG/24HR TD PTWK: 0.3000 mg | MEDICATED_PATCH | TRANSDERMAL | Status: DC

## 2012-04-12 MED ORDER — SODIUM CHLORIDE 0.9 % IV SOLN
500.0000 mg | Freq: Two times a day (BID) | INTRAVENOUS | Status: DC
  Administered 2012-04-12 – 2012-04-13 (×3): 500 mg via INTRAVENOUS
  Filled 2012-04-12 (×5): qty 5

## 2012-04-12 NOTE — Plan of Care (Signed)
Problem: Problem: IV to PO Meds Progression Goal: SWALLOWING PO MEDS WITHOUT DIFFICULTY Outcome: Not Met (add Reason) Pt refusing to swallow PO meds

## 2012-04-12 NOTE — Evaluation (Signed)
Clinical/Bedside Swallow Evaluation Patient Details  Name: [name] MRN: [id number] Date of Birth: 1960/11/02  Today's Date: 04/12/2012 Time: [id number] SLP Time Calculation (min): 27 min  Past Medical History:  Past Medical History  Diagnosis Date  . HTN (hypertension)     resistant  . Hypercholesterolemia   . Tobacco abuse     resumed smoking half a pack a day. She was a smoker in the past and states she was abl eto stop in the past using nicotine patches  . CAD (coronary artery disease)     Pt has St elevation MI in Feb 2009. Left hear tcath at tha ttime showed a 70% distal LAD lesion that was hazy consistent with a plaque rupture. She had a 50% distal circumflex stenosis an a 95% distal RCA stenosis. She did havve drug eluting stent placed in her distal RCA  . Chronic kidney disease     most recent creatinine 1.4 in 3/10  . Intracranial hemorrhage, spontaneous intraparenchymal, idiopathic, remote, resolved   . Diastolic heart failure     pt most recent echo was in Feb 2009 w an EF of 60% and severe left ventricular hypertrophy. The pt did have evidence of diastolic dysfunction. The RV appeared normal  . Subarachnoid hemorrhage     03/2012   Past Surgical History:  Past Surgical History  Procedure Date  . Ventriculoperitoneal shunt 03/27/2012    Procedure: SHUNT INSERTION VENTRICULAR-PERITONEAL;  Surgeon: Winfield Cunas, MD;  Location: [facility];  Service: Neurosurgery;  Laterality: N/A;  Ventricular-Peritoneal Shunt Insertion  . Cardiac catheterization   . Coronary angioplasty    HPI:  51 year old female who was just recently admitted and was found to have a subarachnoid hemorrhage.  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  The pt also underwent the placement of a VP shunt.  During the prior admit, her blood pressure was found to be difficult to be quite difficult to control. Ultimately, the patient was transferred from ICU to rehabilitation on  04/05/2012. Patient was getting feeds through a Panda tube. The tube was discontinued 2 days ago and the patient has had poor intake since then. She was not able to take her medications.  Systolic blood pressure was consistently higher than 200.  Bedside swallow evaluation ordered due to poor PO intake and oral holding of PO meds.    Assessment / Plan / Recommendation Clinical Impression  Demonstrates a cognitive-based etiology for increased aspiration risk due to oral holding, poor ability to initiate and the inability to effectively follow SLP cues (despite max-total verbal assist).  SLP observed 3 normal, timely swallows of thin water, however when applesauce was introduced, patient orally held despite several therapeutic interventions.  SLP suspects her poor initiation to verbally communicate impedes her from letting caregivers know "I don't want applesauce,"  therefore, she orally holds.  These severe cognitive impairments are the primary source for poor PO intake and medication acceptance.  Consulted with CIR therapist who reported this was an ongoing problem on their service and suspect this may continue to persist given extent of cognitive deficits.      Aspiration Risk  Moderate    Diet Recommendation Dysphagia 1 (Puree);Thin liquid (as patient will be accepting of PO's)   Liquid Administration via: Cup;Straw Medication Administration: Via alternative means Supervision: Full supervision/cueing for compensatory strategies;Patient able to self feed Compensations: Check for pocketing;Small sips/bites Postural Changes and/or Swallow Maneuvers: Seated upright 90 degrees;Upright 30-60 min after meal  Other  Recommendations Oral Care Recommendations: Oral care BID;Staff/trained caregiver to provide oral care   Follow Up Recommendations  Skilled Nursing facility    Frequency and Duration min 2x/week  2 weeks   Pertinent Vitals/Pain n/a    SLP Swallow Goals Patient will consume  recommended diet without observed clinical signs of aspiration with: Maximum assistance Patient will utilize recommended strategies during swallow to increase swallowing safety with: Maximum assistance   Swallow Study    Oral/Motor/Sensory Function Overall Oral Motor/Sensory Function: Appears within functional limits for tasks assessed   Ice Chips Ice chips: Not tested   Thin Liquid Thin Liquid: Within functional limits Presentation: Cup;Self Fed;Straw    Nectar Thick Nectar Thick Liquid: Not tested   Honey Thick Honey Thick Liquid: Not tested   Puree Puree: Impaired Presentation: Spoon Oral Phase Impairments:  (oral holding, required suctioning to remove from oral cavity) Oral Phase Functional Implications: Oral holding   Solid Solid: Not tested    Wyline Copas, M.S.,CCC-SLP Pager [phone] 04/12/2012,2:06 PM

## 2012-04-12 NOTE — Progress Notes (Addendum)
[facility] [place] TEAM 1 - Stepdown/ICU TEAM  PCP:  DEFAULT,PROVIDER, MD  Subjective: 51 year old female who was just recently admitted and was found to have a subarachnoid hemorrhage.  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  The pt also underwent the placement of a VP shunt.  During the prior admit, her blood pressure was found to be difficult to be quite difficult to control. Ultimately, the patient was transferred from ICU to rehabilitation on 04/05/2012. Patient was getting feeds through a Panda tube. The tube was discontinued 2 days ago and the patient has had poor intake since then. She was not able to take her medications.  Systolic blood pressure was consistently higher than 200.  Pt is seen for a f/u visit.  Objective:  Intake/Output Summary (Last 24 hours) at 04/12/12 0918 Last data filed at 04/12/12 0902  Gross per 24 hour  Intake  164.4 ml  Output    130 ml  Net   34.4 ml   Blood pressure 190/121, pulse 105, temperature 98.9 F (37.2 C), temperature source Oral, resp. rate 22, height 5\' 1"  (1.549 m), weight 68 kg (149 lb 14.6 oz), SpO2 99.00%.  Physical Exam: F/U exam is completed  Lab Results:  Basename 04/12/12 0730 04/12/12 0653 04/10/12 0500  NA 140 141 137  K 3.5 3.5 4.5  CL 102 102 100  CO2 23 24 25   GLUCOSE 128* 123* 102*  BUN 8 8 17   CREATININE 1.06 1.10 1.34*  CALCIUM 10.0 10.1 10.3  MG -- -- --  PHOS -- -- --    Basename 04/12/12 0730 04/12/12 0653  AST 14 13  ALT 10 10  ALKPHOS 99 98  BILITOT 0.3 0.3  PROT 7.4 7.5  ALBUMIN 3.5 3.5    Basename 04/12/12 0730 04/12/12 0653  WBC 9.4 9.4  NEUTROABS -- --  HGB 11.4* 11.3*  HCT 35.2* 34.6*  MCV 87.3 87.2  PLT 415* 396   No results found for this basename: CKTOTAL:3,CKMB:3,CKMBINDEX:3,TROPONINI:3 in the last 72 hours No results found for this basename: HGBA1C:12 in the last 72 hours  Micro Results: No results found for this or any previous visit (from the  past 240 hour(s)).  Studies/Results: All recent x-ray/radiology reports have been reviewed in detail.   Medications: I have reviewed the patient's complete medication list.  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 Left heart cath at that time showed a 70% distal LAD lesion that was hazy consistent with a plaque rupture. She had a 50% distal circumflex stenosis an a 95% distal RCA stenosis. She did have drug eluting stent placed in her distal RCA   History of chronic kidney disease creatinine 1.4 in 3/10   Hypercholesterolemia   Cherene Altes, MD [facility] Office  [phone] Pager [phone]  On-Call/Text Page:      [email or web address]      password [facility]

## 2012-04-12 NOTE — Progress Notes (Signed)
BP 225/139, HR 97. Nitro bid ointment applied per orders. Will recheck BP in one hour. Continues to complain of headache. Complaining of nausea without vomiting. Updated [place] with rapid response. Coletta Memos

## 2012-04-12 NOTE — Progress Notes (Signed)
After CT scan,  patient threw up approximately 300cc's of clear emesis. Transferred to 3103 after scan. Coletta Memos

## 2012-04-12 NOTE — Discharge Summary (Signed)
NAME:  [name], [name]                ACCOUNT NO.:  [id number]  MEDICAL RECORD NO.:  [id number]  LOCATION:  [id number]                         FACILITY:  [facility]  PHYSICIAN:  Meredith Staggers, M.D.DATE OF BIRTH:  16-Feb-1961  DATE OF ADMISSION:  04/05/2012 DATE OF DISCHARGE:  04/12/2012                              DISCHARGE SUMMARY   DISCHARGE DIAGNOSES: 1. Subarachnoid hemorrhage, status post ventriculoperitoneal shunting     March 27, 2012.  1. Sequential compression devices for deep venous thrombosis     prophylaxis.  1. Seizure prophylaxis.  1. Dysphagia.  1. Hypertension.  1. Decreased nutritional storage.  A 51 year old right-handed female with history of previous intraparenchymal hemorrhage in the past, admitted on Mar 03, 2012 with headache, nausea, vomiting.  CT of the head showed diffuse subarachnoid hemorrhage in the basilar cisterns along the tentorium and anterior falx.  Follow Neurosurgery, Dr. Christella Noa.  The patient was intubated, placement of ventriculostomy catheter.  Later extubated on Mar 04, 2012, hospital course with obstructive hydrocephalus ultimately underwent shunt insertion ventriculoperitoneal right side on March 27, 2012.  The patient did again require intubation due to respiratory difficulties and extubated on March 29, 2012.  Latest followup CT scan satisfactory appearance of VP shunting.  Maintained on Keppra for seizure prophylaxis.  The patient currently on a dysphagia 3 thin liquid diet. Blood pressures have been difficult to manage with followup per Cardiology Services as well as [place] with question, check renal arteries for any stenosis that could be affecting her hypertension.  She was admitted for comprehensive rehab program.  PAST MEDICAL HISTORY:  See discharge diagnosis.  She lives with her daughter 1 level home, 4 steps to entry.  FUNCTIONAL HISTORY PRIOR TO ADMISSION:  Independent driving.  She is on disability.  FUNCTIONAL  STATUS UPON ADMISSION TO REHAB SERVICE:  Was +2 total assist stand pivot transfers, 40% for supine to sit.  PHYSICAL EXAMINATION:  VITAL SIGNS:  Blood pressure 150/82, pulse 76, temperature 98.3, respirations 18.  GENERAL:  This was an extremely lethargic female, encephalopathic appearing, makes eye contact with examiner.  She was oriented to person other than needing extreme cues for her age.  VP shunt site was dressed, staples intact.  LUNGS:  Clear to auscultation.  CARDIAC:  Regular rate and rhythm.  ABDOMEN:  Soft, nontender.  Good bowel sounds.  REHABILITATION HOSPITAL COURSE:  The patient was admitted to inpatient rehab services with therapies initiated on a 3-hour daily basis consisting of physical therapy, occupational therapy, speech therapy, and rehabilitation nursing.  The following issues were addressed during the patient's rehabilitation stay.  Pertaining to [name]'s subarachnoid hemorrhage, she had undergone VP shunting on March 27, 2012, followed by Dr. Christella Noa.  Cranial CT scan on April 12, 2012, showed postoperative changes consistent with aneurysm repairs and ventricular shunt, no acute abnormalities noted.  She remained on sequential compression devices for DVT prophylaxis.  Keppra ongoing for seizure prophylaxis with no seizure activity noted.  She was maintained on mechanical soft diet with thin liquids, however, she needed ongoing prompting to maintain her nutritional support.  The patient is on multiple antihypertensive medications and refusing medications at times with increased variables  in her blood pressures, diastolics of 0000000, close monitoring of her renal function with latest creatinine 17, creatinine 1.34 on April 10, 2012.  The patient received weekly collaborative interdisciplinary team conferences to discuss estimated length of stay, family teaching, and any barriers to discharge.  She was max total assist for most mobility, needing ongoing cues to  maintain her attention.  Noted persistent increased variables in her blood pressure and there was some consideration early on her hospital course and checking for renal artery stenosis.  Due to increased advances in her blood pressures, it was felt she should be discharged to Acute Care setting under medical management to help maintain her pressures. Consideration made to insert Panda feeding tube for nutritional support. The patient was discharged to unit 3100 under the [place].  All medication changes would be made as per [facility].     Lauraine Rinne, P.A.   ______________________________ Meredith Staggers, M.D.    DA/MEDQ  D:  04/12/2012  T:  04/12/2012  Job:  [id number]

## 2012-04-12 NOTE — Progress Notes (Signed)
INITIAL ADULT NUTRITION ASSESSMENT Date: 04/12/2012   Time: 11:20 AM  Reason for Assessment: Nutrition Risk Report  ASSESSMENT: Female 51 y.o.  Dx: HTN  Hx:  Past Medical History  Diagnosis Date  . HTN (hypertension)     resistant  . Hypercholesterolemia   . Tobacco abuse     resumed smoking half a pack a day. She was a smoker in the past and states she was abl eto stop in the past using nicotine patches  . CAD (coronary artery disease)     Pt has St elevation MI in Feb 2009. Left hear tcath at tha ttime showed a 70% distal LAD lesion that was hazy consistent with a plaque rupture. She had a 50% distal circumflex stenosis an a 95% distal RCA stenosis. She did havve drug eluting stent placed in her distal RCA  . Chronic kidney disease     most recent creatinine 1.4 in 3/10  . Intracranial hemorrhage, spontaneous intraparenchymal, idiopathic, remote, resolved   . Diastolic heart failure     pt most recent echo was in Feb 2009 w an EF of 60% and severe left ventricular hypertrophy. The pt did have evidence of diastolic dysfunction. The RV appeared normal  . Subarachnoid hemorrhage     03/2012   Past Surgical History  Procedure Date  . Ventriculoperitoneal shunt 03/27/2012    Procedure: SHUNT INSERTION VENTRICULAR-PERITONEAL;  Surgeon: Winfield Cunas, MD;  Location: [facility];  Service: Neurosurgery;  Laterality: N/A;  Ventricular-Peritoneal Shunt Insertion  . Cardiac catheterization   . Coronary angioplasty    Related Meds:     . cloNIDine  0.3 mg Transdermal Weekly  . hydrALAZINE  10 mg Intravenous Q4H  . levetiracetam  500 mg Intravenous Q12H  . metoprolol  5 mg Intravenous Q6H  . DISCONTD: amLODipine  10 mg Oral Daily  . DISCONTD: carvedilol  12.5 mg Oral BID WC  . DISCONTD: hydrALAZINE  50 mg Oral TID  . DISCONTD: labetalol  400 mg Oral TID  . DISCONTD: losartan  25 mg Oral BID  . DISCONTD: sodium chloride  3 mL Intravenous Q12H  . DISCONTD: sodium chloride  3 mL  Intravenous Q12H  . DISCONTD: spironolactone  25 mg Oral Daily   Ht: 5\' 1"  (154.9 cm)  Wt: 149 lb 14.6 oz (68 kg)  Ideal Wt: 47.7 kg % Ideal Wt: 143%  Wt Readings from Last 15 Encounters:  04/12/12 149 lb 14.6 oz (68 kg)  04/07/12 162 lb 0.6 oz (73.5 kg)  04/05/12 161 lb 13.1 oz (73.4 kg)  04/05/12 161 lb 13.1 oz (73.4 kg)  07/07/11 157 lb (71.215 kg)  07/01/11 153 lb (69.4 kg)  06/20/11 152 lb (68.947 kg)  06/13/11 154 lb (69.854 kg)  04/18/11 154 lb (69.854 kg)  06/21/09 148 lb (67.132 kg)  Usual Wt: 150  - 155 lb % Usual Wt: 98%  Body mass index is 28.33 kg/(m^2). Pt is overweight.  Food/Nutrition Related Hx: pt with prolonged hospitalization and inconsistent, poor PO intake  Labs:  CMP     Component Value Date/Time   NA 140 04/12/2012 0730   K 3.5 04/12/2012 0730   CL 102 04/12/2012 0730   CO2 23 04/12/2012 0730   GLUCOSE 128* 04/12/2012 0730   BUN 8 04/12/2012 0730   CREATININE 1.06 04/12/2012 0730   CALCIUM 10.0 04/12/2012 0730   PROT 7.4 04/12/2012 0730   ALBUMIN 3.5 04/12/2012 0730   AST 14 04/12/2012 0730   ALT 10 04/12/2012  0730   ALKPHOS 99 04/12/2012 0730   BILITOT 0.3 04/12/2012 0730   GFRNONAA 60* 04/12/2012 0730   GFRAA 70* 04/12/2012 0730    Intake/Output Summary (Last 24 hours) at 04/12/12 1120 Last data filed at 04/12/12 1100  Gross per 24 hour  Intake  225.5 ml  Output    255 ml  Net  -29.5 ml  BM on 6/22  Diet Order: Clear Liquid  Supplements/Tube Feeding: none  IVF:     nitroGLYCERIN Last Rate: 70 mcg/min (04/12/12 1118)   Estimated Nutritional Needs:   Kcal:  1700 - 1900 kcal Protein:  70 - 85 grams protein Fluid:  1.8 - 2 liters daily  Pt transferred to acute 2/2 elevated blood pressure.  Pt followed by RD during initial acute hospitalization and inpatient rehab admission. Noted that pt's intake was extremely poor during initial acute hospitalization, pt was recommended to start short-term nutrition support on several RD encounters.  Jevity 1.2 was briefly initiated at the end of acute hospitalization, however panda was removed and TF discontinued upon admission to inpatient rehab.   During pt's admission to inpatient rehab, a 48-hr calorie count was initiated. Calorie count results revealed pt was consuming on average 37% of minimum estimated calorie needs and 32% of minimum estimated protein needs from Dysphagia 3 diet with thin liquids. Pt was supplemented with Ensure Complete PO BID, would take once daily. Pt was started on Megace on 6/21 and ritalin dosage was adjusted by primary MD to help with poor attention at meals. Noted that over the weekend, pt continued to not eat, per nursing notes.  Pt's weight was 162 lb on 6/19, current weight down to 149 lb; a wt change of 8% x 5 days. Question accuracy of these weights, but strongly suspect some level of wt loss given prolonged suboptimal PO intake. Noted MD note from today requesting repeat SLP evaluation and that pt may require PEG for nutrition.  Pt meets criteria for severe malnutrition in the context of acute illness 2/2 8% wt change x approximately 1 week, intake of <50% of estimated energy requirement of greater than 5 days, and likely mild to moderate body fat and muscle loss.   NUTRITION DIAGNOSIS: -Inadequate oral intake (NI-2.1).  Status: Ongoing  RELATED TO: lethargy and poor attention  AS EVIDENCE BY: intake of <50% of meal and frequent refusals  MONITORING/EVALUATION(Goals): Goal: Pt to advance diet as tolerated or initiate nutrition support. Intake to meet at least 90% of estimated needs Monitor: weights, refeeding lab trends (Magnesium, Phosphorus, Potassium), I/O's, diet advancement vs initiation of nutrition support  EDUCATION NEEDS: -No education needs identified at this time  INTERVENTION: 1. Pt is at risk for refeeding syndrome given prolonged suboptimal PO intake during both previous acute hospitalization and inpatient rehab admission. Recommend  monitoring magnesium, potassium, and phosphorus for at least 3 days and with advancement of diet and nutrition support (if initiated.) 2. Strongly recommend initiation of nutrition support as pt is at high nutrition risk. Recommend placing enteral feeding tube and initiating formula of Jevity 1.2 at 10 ml/hr. Advance enteral nutrition by 10 ml/hr every 12 hours to goal of 60 ml/hr. This regimen will provide 1728 kcal, 80 grams protein, and 1166 ml free water. 3. If pt advances to full liquids, recommend Ensure Complete PO BID to help meet nutrition needs. 4. RD to continue to follow nutrition care plan  Dietitian #: [id number] [birth year]  [place] Per approved criteria  -Severe malnutrition in the context  of acute illness or injury    Asencion Partridge 04/12/2012, 11:20 AM

## 2012-04-12 NOTE — H&P (Signed)
[name] is an 51 y.o. female.   PCP - [facility]. Neurosurgeon - Dr.Cabell. Chief Complaint: High blood pressure. HPI: 51 year old female who was just recently admitted after patient was found to have nausea vomiting and hypertension was continued headache and was found to have subarachnoid hemorrhage and cerebral angiogram showed 4 aneurysms of which one was called and patient had undergone VP shunt. Patient during the stay had been intubated twice. And her blood pressure was found to be difficult to be controlled. Patient was transferred from ICU to rehabilitation last week. Patient was getting feeds through Panda tube. The tube was discontinued 2 days ago and patient has had poor total intake since then. She was also not able to take her medications. Last night her blood pressure was found to be getting difficult to control. Systolic blood pressure was consistently higher than 200. The on-call doctor for rehabilitation Dr. Leanne Chang had given IV enalapril, hydralazine despite which blood pressure remained high. At this time we have been called as patient may need transfer to hospital for further management of her blood pressure. On exam patient is not in distress. Has some mild frontal headache. Denies any visual symptoms or focal deficits. Denies any shortness of breath chest pain nausea vomiting.  Past Medical History  Diagnosis Date  . HTN (hypertension)     resistant  . Hypercholesterolemia   . Tobacco abuse     resumed smoking half a pack a day. She was a smoker in the past and states she was abl eto stop in the past using nicotine patches  . CAD (coronary artery disease)     Pt has St elevation MI in Feb 2009. Left hear tcath at tha ttime showed a 70% distal LAD lesion that was hazy consistent with a plaque rupture. She had a 50% distal circumflex stenosis an a 95% distal RCA stenosis. She did havve drug eluting stent placed in her distal RCA  . Chronic kidney disease    most recent creatinine 1.4 in 3/10  . Intracranial hemorrhage, spontaneous intraparenchymal, idiopathic, remote, resolved   . Diastolic heart failure     pt most recent echo was in Feb 2009 w an EF of 60% and severe left ventricular hypertrophy. The pt did have evidence of diastolic dysfunction. The RV appeared normal  . Subarachnoid hemorrhage     03/2012    Past Surgical History  Procedure Date  . Ventriculoperitoneal shunt 03/27/2012    Procedure: SHUNT INSERTION VENTRICULAR-PERITONEAL;  Surgeon: Winfield Cunas, MD;  Location: [place] [facility];  Service: Neurosurgery;  Laterality: N/A;  Ventricular-Peritoneal Shunt Insertion  . Cardiac catheterization   . Coronary angioplasty     Family History  Problem Relation Age of Onset  . Coronary artery disease Other     premature in 1st degree relatives   Social History:  reports that she has been smoking.  She does not have any smokeless tobacco history on file. She reports that she does not drink alcohol or use illicit drugs.  Allergies:  Allergies  Allergen Reactions  . Ampicillin Other (See Comments)    unknown  . Penicillins Other (See Comments)    unknown    Medications Prior to Admission  Medication Sig Dispense Refill  . amLODipine (NORVASC) 10 MG tablet Take 1 tablet (10 mg total) by mouth daily. This is for blood pressure  30 tablet  6  . aspirin EC 81 MG tablet Take 1 tablet (81 mg total) by mouth daily. This is  for your heart      . carvedilol (COREG) 12.5 MG tablet Take 1 tablet (12.5 mg total) by mouth 2 (two) times daily with a meal.  60 tablet  11  . hydrALAZINE (APRESOLINE) 50 MG tablet Take 1 tablet (50 mg total) by mouth 3 (three) times daily. This is for blood pressure  90 tablet  6  . labetalol (NORMODYNE) 200 MG tablet Take 2 tablets (400 mg total) by mouth 3 (three) times daily.  180 tablet  6  . losartan (COZAAR) 25 MG tablet Take 1 tablet (25 mg total) by mouth 2 (two) times daily.  60 tablet  0  . nitroGLYCERIN  (NITROGLYN) 2 % ointment Place 1 inch onto the skin every 6 (six) hours as needed (SBP 99991111 systolic).  30 g  0  . nitroGLYCERIN (NITROSTAT) 0.4 MG SL tablet Place 0.4 mg under the tongue every 5 (five) minutes as needed. For chest pain      . spironolactone (ALDACTONE) 25 MG tablet Take 1 tablet (25 mg total) by mouth daily.  30 tablet  0    Results for orders placed during the hospital encounter of 04/05/12 (from the past 48 hour(s))  PREALBUMIN     Status: Normal   Collection Time   04/10/12  5:00 AM      Component Value Range Comment   Prealbumin 25.3  17.0 - 34.0 mg/dL   BASIC METABOLIC PANEL     Status: Abnormal   Collection Time   04/10/12  5:00 AM      Component Value Range Comment   Sodium 137  135 - 145 mEq/L    Potassium 4.5  3.5 - 5.1 mEq/L    Chloride 100  96 - 112 mEq/L    CO2 25  19 - 32 mEq/L    Glucose, Bld 102 (*) 70 - 99 mg/dL    BUN 17  6 - 23 mg/dL    Creatinine, Ser 1.34 (*) 0.50 - 1.10 mg/dL    Calcium 10.3  8.4 - 10.5 mg/dL    GFR calc non Af Amer 45 (*) >90 mL/min    GFR calc Af Amer 53 (*) >90 mL/min    No results found.  Review of Systems  Constitutional: Negative.   Eyes: Negative.   Respiratory: Negative.   Cardiovascular: Negative.   Gastrointestinal: Negative.   Genitourinary: Negative.   Musculoskeletal: Negative.   Skin: Negative.   Neurological: Positive for headaches.  Endo/Heme/Allergies: Negative.   Psychiatric/Behavioral: Negative.     Blood pressure 200/110, pulse 99, temperature 99.1 F (37.3 C), temperature source Oral, resp. rate 20, height 5\' 1"  (1.549 m), weight 73.5 kg (162 lb 0.6 oz), SpO2 100.00%. Physical Exam  Constitutional: She appears well-developed and well-nourished. No distress.  HENT:       Staples seen in the right side of scalp.  Eyes: Conjunctivae are normal. Pupils are equal, round, and reactive to light. Right eye exhibits no discharge. Left eye exhibits no discharge. No scleral icterus.  Neck: Normal range of  motion. Neck supple.  Cardiovascular: Normal rate and regular rhythm.   Respiratory: Effort normal and breath sounds normal. No respiratory distress. She has no wheezes. She has no rales.  GI: Soft. Bowel sounds are normal. She exhibits no distension. There is no tenderness. There is no rebound.  Musculoskeletal: Normal range of motion. She exhibits no edema and no tenderness.  Neurological: She is alert.       Oriented to her name and  place and knows her PCP's office name. Follows commands. Moves all extremities 5/5.  Skin: Skin is warm and dry. She is not diaphoretic.  Psychiatric: Her behavior is normal.     Assessment/Plan #1. Accelerated hypertension - I have discussed with [facility] M. consultant Dr.Alva. At this time I have ordered labetalol 20 mg IV every 2 hourly when necessary for systolic blood pressure more than 160. Also start patient on nitroglycerin infusion. Patient is also on clonidine patch. If these measures don't improve then we need to reconsult critical care for possible starting Cardene infusion. As patient is complaining of headache which as per the nurse patient did not complain previously. This could be from the high blood pressure. We'll check a CT head without contrast. There is a concern that patient pressure may be high due to RAS. #2. Subarachnoid hemorrhage status post coiling of the aneurysm and also VP shunt placement. #3. Recent ventilator dependent respiratory failure secondary to #2 reason. #4. History of CAD status post stenting - presently chest pain-free. #5. History of chronic kidney disease - follow metabolic panel and intake output.  CODE STATUS - full code.  Diane Victoria N. 04/12/2012, 4:56 AM

## 2012-04-12 NOTE — Progress Notes (Signed)
BP 243/137, HR 99.  Continues to complain of HA.  Denies nausea at this time. Updated rapid response. Paged Dr. Leanne Chang related to BP. Dr. Leanne Chang to speak with hospitalist related BP issues. Coletta Memos

## 2012-04-12 NOTE — Progress Notes (Signed)
Patient's blood pressure remains elevated SBP 200s, [facility] at bedside, recommends transfer to SDU and CT of head. Will continue to monitor

## 2012-04-12 NOTE — Progress Notes (Signed)
Called by bedside RN to evaluate patient with high blood pressure. Patient has had SBPs 200s throughout hospital admission. Per bedside RN, patient has been refusing blood pressure and pain medicine for the past few days.Upon assessment patient complains of headache, but still refusing medication despite trying to explain the benefits of taking pain medicine and her prescribed blood pressured medicine, MD aware and 5mg  IV lopressor given for BP 234/139 HR 96 100% RA RR 16. Patient alert and oriented, no signs of shunt malfunction. Will recheck in two hours and then apply nitro paste if SBP>180, advised bedside RN to call if any issues, will continue to monitor

## 2012-04-12 NOTE — Progress Notes (Signed)
Dr Thereasa Solo aware of pt's BP 190/121. Pt refusing to take anything PO; all meds changed to IV and swallow eval ordered. Will continue to monitor BP and titrate off of Nitro drip per MD.  Hendricks Milo

## 2012-04-13 ENCOUNTER — Inpatient Hospital Stay (HOSPITAL_COMMUNITY): Payer: No Typology Code available for payment source

## 2012-04-13 DIAGNOSIS — I2510 Atherosclerotic heart disease of native coronary artery without angina pectoris: Secondary | ICD-10-CM

## 2012-04-13 DIAGNOSIS — I10 Essential (primary) hypertension: Secondary | ICD-10-CM

## 2012-04-13 DIAGNOSIS — N189 Chronic kidney disease, unspecified: Secondary | ICD-10-CM

## 2012-04-13 LAB — BASIC METABOLIC PANEL
BUN: 8 mg/dL (ref 6–23)
CO2: 23 mEq/L (ref 19–32)
Calcium: 9.5 mg/dL (ref 8.4–10.5)
Chloride: 100 mEq/L (ref 96–112)
Creatinine, Ser: 1.13 mg/dL — ABNORMAL HIGH (ref 0.50–1.10)
GFR calc Af Amer: 65 mL/min — ABNORMAL LOW (ref >90)

## 2012-04-13 LAB — CBC
HCT: 34.3 % — ABNORMAL LOW (ref 36.0–46.0)
MCHC: 32.9 g/dL (ref 30.0–36.0)
MCV: 87.5 fL (ref 78.0–100.0)
Platelets: 362 10*3/uL (ref 150–400)
RDW: 13.7 % (ref 11.5–15.5)

## 2012-04-13 LAB — VMA AND HVA, 24 HR UR
24 Hour urine volume (VMAHVA): 1600 mL/24 h
VMA, 24H Ur Adult: 2.7 mg/24 h (ref <=6.0)

## 2012-04-13 MED ORDER — JEVITY 1.2 CAL PO LIQD
1000.0000 mL | ORAL | Status: DC
  Administered 2012-04-13: 1000 mL
  Filled 2012-04-13 (×5): qty 1000

## 2012-04-13 MED ORDER — LABETALOL HCL 200 MG PO TABS
400.0000 mg | ORAL_TABLET | Freq: Three times a day (TID) | ORAL | Status: DC
  Administered 2012-04-13 – 2012-04-16 (×10): 400 mg
  Filled 2012-04-13 (×15): qty 2

## 2012-04-13 MED ORDER — LEVETIRACETAM 100 MG/ML PO SOLN
500.0000 mg | Freq: Two times a day (BID) | ORAL | Status: DC
  Administered 2012-04-13 – 2012-04-16 (×6): 500 mg
  Filled 2012-04-13 (×8): qty 5

## 2012-04-13 MED ORDER — HYDRALAZINE HCL 50 MG PO TABS
50.0000 mg | ORAL_TABLET | Freq: Three times a day (TID) | ORAL | Status: DC
  Administered 2012-04-13 – 2012-04-16 (×10): 50 mg
  Filled 2012-04-13 (×15): qty 1

## 2012-04-13 MED ORDER — AMLODIPINE BESYLATE 10 MG PO TABS
10.0000 mg | ORAL_TABLET | Freq: Every day | ORAL | Status: DC
  Administered 2012-04-13 – 2012-04-16 (×4): 10 mg
  Filled 2012-04-13 (×5): qty 1

## 2012-04-13 MED ORDER — CARVEDILOL 12.5 MG PO TABS
12.5000 mg | ORAL_TABLET | Freq: Two times a day (BID) | ORAL | Status: DC
  Administered 2012-04-13 – 2012-04-14 (×2): 12.5 mg
  Filled 2012-04-13 (×4): qty 1

## 2012-04-13 MED ORDER — FREE WATER
200.0000 mL | Status: DC
  Administered 2012-04-13 – 2012-04-16 (×18): 200 mL

## 2012-04-13 NOTE — Progress Notes (Signed)
Speech Language Pathology Dysphagia Treatment Patient Details Name: [name] MRN: [id number] DOB: [birth year] Today's Date: 04/13/2012 Time: [id number] SLP Time Calculation (min): 13 min  Assessment / Plan / Recommendation Clinical Impression  Patient continues to present with a cognitive-based origin for both increased aspiration risk and poor po intake. Patient alert today but generally refusing po intake via head not. Opened mouth and accepted thin liquids of choice with max verbal and tactile encouragement from SLP (3-4 sips) with what appears to be normal swallowing function characterized by appropriate oral transit of bolus, timely appearing swallow, appropriate hyo-laryngeal elevation/excusion, and no overt indication of aspiration. Education complete with patient regarding rationale for increasing po intake in increasing strength and preventing need for non-oral means of nutrition. SLP will continue to f/u. Discussed patient with MD this am who reported that discussion may begin regarding need for PEG in the next few days if po intake does not improve. Will f/u.     Diet Recommendation  Continue with Current Diet: Dysphagia 1 (puree);Thin liquid    SLP Plan Continue with current plan of care   Pertinent Vitals/Pain n/a   Swallowing Goals  SLP Swallowing Goals Patient will consume recommended diet without observed clinical signs of aspiration with: Maximum assistance Swallow Study Goal #1 - Progress: Progressing toward goal Patient will utilize recommended strategies during swallow to increase swallowing safety with: Maximum assistance Swallow Study Goal #2 - Progress: Progressing toward goal  General Temperature Spikes Noted: No Respiratory Status: Room air Behavior/Cognition: Alert;Decreased sustained attention;Doesn't follow directions;Requires cueing Oral Cavity - Dentition: Adequate natural dentition Patient Positioning: Upright in bed  Oral Cavity - Oral Hygiene Does  patient have any of the following "at risk" factors?: Other - dysphagia Brush patient's teeth BID with toothbrush (using toothpaste with fluoride): Yes Patient is HIGH RISK - Oral Care Protocol followed (see row info): Yes Patient is AT RISK - Oral Care Protocol followed (see row info): Yes Patient is mechanically ventilated, follow VAP prevention protocol for oral care: Oral care provided every 4 hours   Dysphagia Treatment Treatment focused on: Skilled observation of diet tolerance Treatment Methods/Modalities: Skilled observation Patient observed directly with PO's: Yes Type of PO's observed: Thin liquids Feeding: Able to feed self Liquids provided via: [place], CCC-SLP [phone]  Lanai Conlee Meryl 04/13/2012, 10:34 AM

## 2012-04-13 NOTE — Progress Notes (Signed)
[facility]  I have had the chance to speak with Dr Christella Noa who states that he does suspect that Ms Funkhouser poor PO intake is secondary to the [employer] [place] last month. He did recommend in the past a PEG tube but the family declined this. He will be speaking to the daughter in regards to this again today. We ([facility]) will follow up on this discussion tomorrow.   Debbe Odea, MD [phone]

## 2012-04-13 NOTE — Clinical Documentation Improvement (Signed)
CHF DOCUMENTATION CLARIFICATION QUERY  THIS DOCUMENT IS NOT A PERMANENT PART OF THE MEDICAL RECORD  Please update your documentation within the medical record to reflect your response to this query.                                                                                    04/13/12  Dear Dr.Rai / Associates,  In a better effort to capture your patient's severity of illness, reflect appropriate length of stay and utilization of resources, a review of the patient medical record has revealed the following indicators the diagnosis of Heart Failure.   Based on your clinical judgment, please clarify and document in a progress note and/or discharge summary the clinical condition associated with the following supporting information: In responding to this query please exercise your independent judgment.  The fact that a query is asked, does not imply that any particular answer is desired or expected.   Hi Dr. Wynelle Cleveland!  This patient has "CHF" documented in the progress notes. If possible could you please help provide greater specificity for this patient in the progress note and discharge summary. [place] YOU!  [facility]: The acuity and type of CHF should be documented when known [acute, chronic, acute on chronic, systolic, diastolic, systolic and diastolic].  Possible Clinical Conditions?  - Diastolic Congestive Heart Failure  - Other condition (please document in the progress notes and/or discharge summary)  - Cannot Clinically determine at this time    Supporting Information:  - "diastolic heart failure" is listed in the history of the patient  - carvedilol (COREG) tablet 12.5 mg two times daily ordered 6/25    additional documentation in chart upon review. SM    Thank You,  Hartley Barefoot  Clinical Documentation Specialist: [phone] Pager  [place]

## 2012-04-13 NOTE — Clinical Documentation Improvement (Signed)
MALNUTRITION DOCUMENTATION CLARIFICATION  THIS DOCUMENT IS NOT A PERMANENT PART OF THE MEDICAL RECORD  Please update your documentation within the medical record to reflect your response to this query.                                                                                        04/13/12   Dear Dr.Rai / Associates,  In a better effort to capture your patient's severity of illness, reflect appropriate length of stay and utilization of resources, a review of the patient medical record has revealed the following indicators.   Based on your clinical judgment, please clarify and document in a progress note and/or discharge summary the clinical condition associated with the following supporting information: In responding to this query please exercise your independent judgment.  The fact that a query is asked, does not imply that any particular answer is desired or expected.   "Severe Malnutrition" was documented by the nutritionist on 6/24. If your clinical findings/judgment agrees with their diagnosis, could you please document the diagnosis in the progress note(s) and discharge summary. THANK YOU!    Supporting Information:  - "Pt meets criteria for severe malnutrition in the context of acute illness 2/2 8% wt change x approximately 1 week, intake of <50% of estimated energy requirement of greater than 5 days, and likely mild to moderate body fat and muscle loss." INITIAL ADULT NUTRITION ASSESSMENT Date: 04/12/2012 Time: 11:20 AM    additional documentation in chart upon review. SM    Thank You,  Hartley Barefoot  Clinical Documentation Specialist: [phone] Pager  [place]

## 2012-04-13 NOTE — Progress Notes (Signed)
Pt received tray, initiating spoon to mouth, swallowed one bite with coke and then pocketed food inside mouth. Pt would not swallow food or spit out what was in her mouth.

## 2012-04-13 NOTE — Evaluation (Signed)
Occupational Therapy Evaluation Patient Details Name: [name] MRN: [id number] DOB: 1961/01/23 Today's Date: 04/13/2012 Time: [id number] OT Time Calculation (min): 23 min  OT Assessment / Plan / Recommendation Clinical Impression  This 51 y.o. female admitted with hypertensive crisis and ICH . She has had prolonged hospital stay. Pt with x1 coiling of aneursym with x4 total. Pt continues to have BP elevations and transferred from CIR back to inpatient status in acute. She demonstrates the below listed deficits and will benefit from OT to maximize safety and independence with ADLs. She would be an excellent CIR candidate if family able to provide 24 hour assistance at discharge.     OT Assessment  Patient needs continued OT Services    Follow Up Recommendations  Inpatient Rehab;Supervision/Assistance - 24 hour    Barriers to Discharge      Equipment Recommendations  Defer to next venue    Recommendations for Other Services Rehab consult  Frequency  Min 3X/week    Precautions / Restrictions Precautions Precautions: Fall (panda tube) Precaution Comments: decreased initiation and safety awareness Restrictions Weight Bearing Restrictions: No   Pertinent Vitals/Pain BP continues to remain elevated     ADL  Eating/Feeding: Performed;Set up (verbal cue for initiation) Where Assessed - Eating/Feeding: Edge of bed (pt with panda but can self feed lacks initiation ) Transfers/Ambulation Related to ADLs: pt too fatigue to complete and attempting return to supine after EOB self feeding ADL Comments: Pt sat EOB with BP decrease and attemtping return to supine. (see PT note for vitals [facility]). Pt required mod v/c to initiate self feeding. Pt regulating bites to very small amounts. Pt with x4-6 small sips of chicken broth (maybe teaspoon and half total volume) and 2 squares of jello. Pt terminating task due to attention level sustained ~20 seconds and fatigue. Pt with minimla  verbalizations during session.    OT Diagnosis: Generalized weakness;Cognitive deficits;Disturbance of vision  OT Problem List: Decreased strength;Decreased range of motion;Decreased activity tolerance;Impaired balance (sitting and/or standing);Impaired vision/perception;Decreased coordination;Decreased cognition;Decreased safety awareness;Decreased knowledge of use of DME or AE;Cardiopulmonary status limiting activity;Impaired UE functional use OT Treatment Interventions: Self-care/ADL training;Therapeutic exercise;Neuromuscular education;DME and/or AE instruction;Manual therapy;Therapeutic activities;Cognitive remediation/compensation;Visual/perceptual remediation/compensation;Patient/family education;Balance training   OT Goals Acute Rehab OT Goals OT Goal Formulation: Patient unable to participate in goal setting Time For Goal Achievement: 04/27/12 Potential to Achieve Goals: Good ADL Goals Pt Will Perform Grooming: with min assist;Sitting, edge of bed;Unsupported ADL Goal: Grooming - Progress: Goal set today Pt Will Perform Upper Body Bathing: with min assist;Sitting, edge of bed;Unsupported ADL Goal: Upper Body Bathing - Progress: Goal set today Pt Will Perform Lower Body Bathing: with mod assist;Sit to stand from chair ADL Goal: Lower Body Bathing - Progress: Goal set today Pt Will Transfer to Toilet: with mod assist;Stand pivot transfer;3-in-1 ADL Goal: Toilet Transfer - Progress: Goal set today Additional ADL Goal #2: Pt. will be oriented to place and situation with the use of external cues ADL Goal: Additional Goal #2 - Progress: Goal set today  Visit Information  Last OT Received On: 04/13/12 Assistance Needed: +2 PT/OT Co-Evaluation/Treatment: Yes    Subjective Data  Subjective: "The 5th month 27th day in 1962"- response to question cue "when is your birthday" Patient Stated Goal: none stated- pt with minimal verbalizations   Prior Functioning  Home Living Lives With:  Daughter Available Help at Discharge: Family;Available 24 hours/day Type of Home: House Home Access: Stairs to enter [employer] of Steps: 4 Entrance Stairs-Rails: Can reach  both Home Layout: One level Bathroom Shower/Tub: Tub/shower unit;Door Home Adaptive Equipment: Walker - rolling Additional Comments: Family not available to confirm living environment, uncertain of accuracy given by pt.  Prior Function Level of Independence: Independent Able to Take Stairs?: Yes Driving: Yes Vocation: On disability Communication Communication:  (Pt very little verbalization to assess) Dominant Hand: Left    Cognition  Overall Cognitive Status: Impaired Area of Impairment: Memory;Attention Arousal/Alertness: Lethargic Orientation Level:  (oriented to person) Behavior During Session: Flat affect Current Attention Level: Sustained (moments of sustained) Problem Solving: slow to process    Extremity/Trunk Assessment Right Upper Extremity Assessment RUE ROM/Strength/Tone: WFL for tasks assessed RUE ROM/Strength/Tone Deficits: AROM ~150 degrees RUE Sensation: WFL - Proprioception (Pt with hand supinated and unaware/ v/c to correct position) RUE Coordination: Deficits RUE Coordination Deficits: due to weakness Left Upper Extremity Assessment LUE ROM/Strength/Tone: WFL for tasks assessed (tremors noted) LUE Coordination: Deficits LUE Coordination Deficits: due to weakness Right Lower Extremity Assessment RLE ROM/Strength/Tone: Deficits;Unable to fully assess;Due to impaired cognition RLE ROM/Strength/Tone Deficits: At least 3/5 gross with functional mobility Left Lower Extremity Assessment LLE ROM/Strength/Tone: Deficits;Unable to fully assess;Due to impaired cognition LLE ROM/Strength/Tone Deficits: at least 3/5 gross with functional mobility   Mobility Bed Mobility Bed Mobility: Supine to Sit;Sit to Supine Supine to Sit: 3: Mod assist Sit to Supine: 4: Min assist Details for  Bed Mobility Assistance: (A) to elevate trunk and initiate LE OOB.  (A) to slowly lower upper body into bed.  Pt needs extra time to complete.   Exercise    Balance Balance Balance Assessed: Yes Static Sitting Balance Static Sitting - Balance Support: Feet supported;Right upper extremity supported Static Sitting - Level of Assistance: 5: Stand by assistance Static Sitting - Comment/# of Minutes: ~8 minutes to perform to eat EOB; [place] (A) for safety;  pt did go onto right side due to fatigue and ready to lay down.   Dynamic Sitting Balance Dynamic Sitting - Balance Support: Feet supported Dynamic Sitting - Level of Assistance: 4: Min assist Dynamic Sitting Balance - Compensations: (A) to elevate shoulders OOB from sidelye on right forearm Dynamic Sitting - Balance Activities: Lateral lean/weight shifting;Reaching for objects  End of Session OT - End of Session Activity Tolerance: Patient limited by fatigue Patient left: in bed;with call bell/phone within reach Nurse Communication: Precautions   Sharol Harness [facility] 04/13/2012, 9:53 AM Pager: [phone]

## 2012-04-13 NOTE — Progress Notes (Addendum)
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  She was noted to have 3 other aneurysms present.  Looking further back into her notes, it appears that she first saw Dr Aundra Dubin on 6/12 after her PCP had fired her. She stated to Dr Aundra Dubin that she had not taken any BP meds in weeks. In later notes I see that her BP was difficult to control and that she was not always compliant with her medications.   Subjective: Patient is alert and tells me that she enjoys eating pizza. Her current issue is pocketing her food and meds and resultant Hypertensive urgency.  Her daughter and sister are in the room and noted to be angry that she is not getting better. They are placing blame on her being discharged to early to [place] not pushing her BP meds into her even if she was refusing.  They state that after her aneurysm rupture, she was eating well and now she is not. Apparently she eats well whenever they have come to see her at [facility] and once they brought her chinese upon her request and she ate it as well. They feel that either the staff is not feeding her well, she is not liking the food they give her or her brain injury is affecting her PO intake. They tell me that her foods of choice are fried chicken, mac and cheese, pizza.  They are requesting that someone tell them if she has any further brain damage and are wanting to know how her previous brain injury has affected her. They feel that Dr Christella Noa "breaks it down for them well" and would like to speak with him.  I have explained to them that currently poor PO intake may be from depression vs effects of TBI. They are in agreement with the plan to feed her via NG at night and allow her to eat on her own  during the day.   Objective:  Intake/Output Summary (Last 24 hours) at 04/13/12 1340 Last data filed at 04/13/12 1300  Gross per 24 hour  Intake 1667.6 ml  Output    918 ml  Net  749.6 ml   Blood pressure 148/72, pulse 86, temperature 98.5 F (36.9 C), temperature source Oral, resp. rate 14, height 5\' 1"  (1.549 m), weight 69.7 kg (153 lb 10.6 oz), SpO2 97.00%.  Physical Exam: HENT: Staples seen in the right side of scalp.  Eyes: Conjunctivae are normal. Pupils are equal, round, and reactive to light.  Cardiovascular: Normal rate and regular rhythm.  Respiratory: Effort normal and breath sounds normal. No respiratory distress. She has no wheezes. She has no rales.  GI: Soft. Bowel sounds are normal. She exhibits no distension. There is no tenderness. There is no rebound.  Neurological: She is alert.  Oriented to her name and place,  intact, speech clear. Follows commands.    Lab Results:  Basename 04/13/12 0340 04/12/12 0730 04/12/12 0653  NA 137 140 141  K 3.9 3.5 3.5  CL 100 102 102  CO2 23 23 24   GLUCOSE 86 128* 123*  BUN 8 8 8   CREATININE 1.13* 1.06 1.10  CALCIUM 9.5 10.0 10.1  MG -- -- --  PHOS -- -- --  Basename 04/12/12 0730 04/12/12 0653  AST 14 13  ALT 10 10  ALKPHOS 99 98  BILITOT 0.3 0.3  PROT 7.4 7.5  ALBUMIN 3.5 3.5    Basename 04/13/12 0340 04/12/12 0730 04/12/12 0653  WBC 8.3 9.4 9.4  NEUTROABS -- -- --  HGB 11.3* 11.4* 11.3*  HCT 34.3* 35.2* 34.6*  MCV 87.5 87.3 87.2  PLT 362 415* 396   No results found for this basename: CKTOTAL:3,CKMB:3,CKMBINDEX:3,TROPONINI:3 in the last 72 hours No results found for this basename: HGBA1C:12 in the last 72 hours  Micro Results: No results found for this or any previous visit (from the past 240 hour(s)).  Studies/Results: All recent x-ray/radiology reports have been reviewed in detail.   Medications: I have reviewed the patient's complete medication  list.  Assessment/Plan:  Accelerated/Malignant hypertension  Panda has been placed to allow delivery of PO meds. BP is improving. Will attempt to titrate off of Nitro.   ?dysphagia vs/ refusal to swallow Repeat SLP eval done- noted that most of her issue is cognitive and she has no physical difficulty in swallowing.  Not certain if this is related to TBI or depression from recent prolonged hoslital and then CIR stay.  For now a Loni Muse has been placed and explained to the family that this is to  allow TF at night and PO intake in the day time- I have placed her on a regular diet and she may eat what she pleases as long as we do not give her excess sodium.   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 and subsequent VP shunt due to hydrocephalus As she does have 3 remaining aneurysm's will need to carefully control BP.   Recent ventilator dependent respiratory failure   History of CAD status post stenting  ST elevation MI in Feb 2009. Left heart cath at that time showed a 70% distal LAD lesion that was hazy consistent with a plaque rupture. She had a 50% distal circumflex stenosis an a 95% distal RCA stenosis. She did have drug eluting stent placed in her distal RCA   History of chronic kidney disease creatinine 1.4 in 3/10   Hypercholesterolemia    Code Status: Full code Family communication: long discussion with daughter and sister today Time > 65 in care of patient, discussion with nurses and family and through review of her prior records.  RN will also page Dr Christella Noa and ask if he can answer specific questions from the family.   Debbe Odea, MD [phone]

## 2012-04-13 NOTE — Progress Notes (Signed)
Nutrition Follow-up Consult received for the initiation and management of TF. Per MD order will start nocturnal tube feedings.    Intervention:   1. Initiate nocturnal TF slowly due to pt being at risk for refeeding syndrome given prolonged suboptimal PO intake during both previous acute hospitalization and inpatient rehab admission. Will monitor magnesium, potassium, and phosphorus for at least 3 days and with advancement of diet and nutrition support. 2. Start Jevity 1.2 @ 60 ml/hr this pm and run from 17:00-07:00 which will provide: 1008 kcal, 47 grams protein, 681 ml H2O.  3. Will monitor labs and if well tolerated will advance to 80 ml/hr x 14 hrs and run from 17:00-07:00 which will provide: 1344 kcal, 62 grams protein, 907 ml H2O. 4. Will monitor labs and if well tolerated will advance to goal rate for nocturnal TF which would run from 14 hours, 17:00-07:00 at a goal rate of 105 ml/hr. Would provide: 1764 kcal, 81 grams protein, 1192 ml H2O.   Plan discussed with MD.   Diet Order:  Clear Liquids   SLP eval continues to report that pt has a cognitive-based origin for both increased aspiration risk and poor po intake. Pt is noted to hold foods in her mouth despite several interventions to get pt to swallow. SLP recommends advancement to Dysphagia 1/Thin. Per MD may need to discuss placing a PEG for long term nutrition.  Patient has nasoenteric feeding tube in place with tip of tube pointing back towards the distal gastric body.   Free water flushes: 200 ml every 4 hrs providing 1200 of free water.  Last bm: 04/10/12    Meds: Scheduled Meds:   . amLODipine  10 mg Per Tube Daily  . carvedilol  12.5 mg Per Tube BID WC  . cloNIDine  0.3 mg Transdermal Weekly  . free water  200 mL Per Tube Q4H  . hydrALAZINE  10 mg Intravenous Q4H  . hydrALAZINE  50 mg Per Tube TID  . labetalol  400 mg Per Tube TID  . levetiracetam  500 mg Intravenous Q12H  . DISCONTD: metoprolol  5 mg Intravenous Q6H     Continuous Infusions:   . nitroGLYCERIN 25 mcg/min (04/13/12 1046)  . DISCONTD: sodium chloride 75 mL/hr at 04/13/12 0800   PRN Meds:.acetaminophen, labetalol, morphine injection, ondansetron (ZOFRAN) IV  Labs:  CMP     Component Value Date/Time   NA 137 04/13/2012 0340   K 3.9 04/13/2012 0340   CL 100 04/13/2012 0340   CO2 23 04/13/2012 0340   GLUCOSE 86 04/13/2012 0340   BUN 8 04/13/2012 0340   CREATININE 1.13* 04/13/2012 0340   CALCIUM 9.5 04/13/2012 0340   PROT 7.4 04/12/2012 0730   ALBUMIN 3.5 04/12/2012 0730   AST 14 04/12/2012 0730   ALT 10 04/12/2012 0730   ALKPHOS 99 04/12/2012 0730   BILITOT 0.3 04/12/2012 0730   GFRNONAA 56* 04/13/2012 0340   GFRAA 65* 04/13/2012 0340  CBG (last 3)  No results found for this basename: GLUCAP:3 in the last 72 hours Phosphorus  Date/Time Value Range Status  04/02/2012  3:25 AM 4.1  2.3 - 4.6 mg/dL Final  04/01/2012  3:40 AM 4.8* 2.3 - 4.6 mg/dL Final  03/31/2012 10:44 PM 4.8* 2.3 - 4.6 mg/dL Final   Magnesium  Date/Time Value Range Status  04/02/2012  3:25 AM 1.6  1.5 - 2.5 mg/dL Final  04/01/2012  3:40 AM 1.8  1.5 - 2.5 mg/dL Final  03/31/2012  3:40 AM 1.6  1.5 - 2.5 mg/dL Final    Intake/Output Summary (Last 24 hours) at 04/13/12 1113 Last data filed at 04/13/12 1100  Gross per 24 hour  Intake 1671.15 ml  Output    763 ml  Net 908.15 ml    Weight Status:   153 lbs 6/25  Estimated needs:  Kcal: 1700 - 1900, Protein: 70 - 85 grams, Fluid: 1.8 - 2 liters daily  Nutrition Dx:  Inadequate oral intake r/t lethargy and poor attention AEB intake of <50% of meal and frequent refusals; ongoing  Goal: Pt to meet >/= 90% of their estimated nutrition needs; not met   Monitor:  Po intake, TF tolerance, weight   Maylon Peppers, [place], [place], [place] pager

## 2012-04-13 NOTE — Progress Notes (Signed)
Dr. Christella Noa given pt's daughter's # d/t daughter's questions and concerns.   [name]

## 2012-04-13 NOTE — Progress Notes (Signed)
Spoke with Dr Wynelle Cleveland regarding Panda placement and x-ray. Advised to pull back Panda and flush w/ H20. No further x-ray ordered at this time. Panda pulled back in 5 cm increments until good placement was auscultated and Panda flushed easily. Orders received for meds via tube and free water.   [name]

## 2012-04-13 NOTE — Evaluation (Signed)
Physical Therapy Evaluation Patient Details Name: [name] MRN: [id number] DOB: 1961-05-20 Today's Date: 04/13/2012 Time: [id number] PT Time Calculation (min): 28 min  PT Assessment / Plan / Recommendation Clinical Impression  51 year old female who was just recently admitted and was found to have a subarachnoid hemorrhage.  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  The pt also underwent the placement of a VP shunt.  During the prior admit, her blood pressure was found to be difficult to be quite difficult to control. Ultimately, the patient was transferred from ICU to rehabilitation on 04/05/2012.  Pt will benefit from acute PT services to improve overall mobility and improve overall strength.  Monitored BP throughout session:  160/88 supine, 165/92 sitting, 154/85 sitting, 141/71 in recliner.     PT Assessment  Patient needs continued PT services    Follow Up Recommendations  Inpatient Rehab    Barriers to Discharge        lEquipment Recommendations  Defer to next venue    Recommendations for Other Services Rehab consult   Frequency Min 3X/week    Precautions / Restrictions Precautions Precautions: Fall Restrictions Weight Bearing Restrictions: No   Pertinent Vitals/Pain No c/o pain      Mobility  Bed Mobility Bed Mobility: Supine to Sit;Sit to Supine Supine to Sit: 3: Mod assist Sit to Supine: 4: Min assist Details for Bed Mobility Assistance: (A) to elevate trunk and initiate LE OOB.  (A) to slowly lower upper body into bed.  Pt needs extra time to complete.    Exercises     PT Diagnosis:    PT Problem List: Decreased strength;Decreased range of motion;Decreased activity tolerance;Decreased balance;Decreased mobility;Decreased cognition;Decreased knowledge of use of DME PT Treatment Interventions: DME instruction;Gait training;Functional mobility training;Therapeutic activities;Therapeutic exercise;Balance training;Neuromuscular  re-education;Patient/family education;Cognitive remediation   PT Goals Acute Rehab PT Goals PT Goal Formulation: Patient unable to participate in goal setting Time For Goal Achievement: 04/27/12 Potential to Achieve Goals: Good Pt will go Supine/Side to Sit: with supervision PT Goal: Supine/Side to Sit - Progress: Goal set today Pt will Sit at Edge of Bed: Independently;3-5 min PT Goal: Sit at Edge Of Bed - Progress: Goal set today Pt will go Sit to Supine/Side: with supervision PT Goal: Sit to Supine/Side - Progress: Goal set today Pt will go Sit to Stand: with mod assist PT Goal: Sit to Stand - Progress: Goal set today Pt will go Stand to Sit: with mod assist PT Goal: Stand to Sit - Progress: Goal set today Pt will Transfer Bed to Chair/Chair to Bed: with mod assist PT Transfer Goal: Bed to Chair/Chair to Bed - Progress: Goal set today Pt will Stand: 3 - 5 min;with mod assist PT Goal: Stand - Progress: Goal set today Pt will Ambulate: 1 - 15 feet;with rolling walker PT Goal: Ambulate - Progress: Goal set today  Visit Information  Last PT Received On: 04/13/12 Assistance Needed: +2 PT/OT Co-Evaluation/Treatment: Yes    Subjective Data  Subjective: pt with minimal verbalizations and needs encouragement to respond.   Patient Stated Goal: Did not set   Prior Functioning  Home Living Lives With: Daughter Available Help at Discharge: Family;Available 24 hours/day Type of Home: House Home Access: Stairs to enter [employer] of Steps: 4 Entrance Stairs-Rails: Can reach both Home Layout: One level Bathroom Shower/Tub: Tub/shower unit;Door Home Adaptive Equipment: Walker - rolling Additional Comments: Family not available to confirm living environment, uncertain of accuracy given by pt.  Prior Function Level  of Independence: Independent Able to Take Stairs?: Yes Driving: Yes Vocation: On disability Communication Communication:  (Pt very little verbalization to  assess) Dominant Hand: Left    Cognition  Overall Cognitive Status: Impaired Area of Impairment: Memory;Attention Arousal/Alertness: Lethargic Orientation Level:  (oriented to person) Behavior During Session: Flat affect Current Attention Level: Sustained (moments of sustained) Problem Solving: slow to process    Extremity/Trunk Assessment Right Lower Extremity Assessment RLE ROM/Strength/Tone: Deficits;Unable to fully assess;Due to impaired cognition RLE ROM/Strength/Tone Deficits: At least 3/5 gross with functional mobility Left Lower Extremity Assessment LLE ROM/Strength/Tone: Deficits;Unable to fully assess;Due to impaired cognition LLE ROM/Strength/Tone Deficits: at least 3/5 gross with functional mobility   Balance Balance Balance Assessed: Yes Static Sitting Balance Static Sitting - Balance Support: Feet supported;Right upper extremity supported Static Sitting - Level of Assistance: 5: Stand by assistance Static Sitting - Comment/# of Minutes: ~8 minutes to perform to eat EOB; [place] (A) for safety;  pt did go onto right side due to fatigue and ready to lay down.   Dynamic Sitting Balance Dynamic Sitting - Balance Support: Feet supported Dynamic Sitting - Level of Assistance: 4: Min assist Dynamic Sitting Balance - Compensations: (A) to elevate shoulders OOB from sidelye on right forearm Dynamic Sitting - Balance Activities: Lateral lean/weight shifting;Reaching for objects  End of Session PT - End of Session Activity Tolerance: Patient limited by fatigue Patient left: in bed;with call bell/phone within reach Nurse Communication: Mobility status   [name] 04/13/2012, 9:37 AM [name], PT DPT [phone]

## 2012-04-14 DIAGNOSIS — N189 Chronic kidney disease, unspecified: Secondary | ICD-10-CM

## 2012-04-14 DIAGNOSIS — I10 Essential (primary) hypertension: Secondary | ICD-10-CM

## 2012-04-14 DIAGNOSIS — R1310 Dysphagia, unspecified: Secondary | ICD-10-CM

## 2012-04-14 DIAGNOSIS — I2510 Atherosclerotic heart disease of native coronary artery without angina pectoris: Secondary | ICD-10-CM

## 2012-04-14 LAB — CBC
Platelets: 315 10*3/uL (ref 150–400)
RDW: 13.6 % (ref 11.5–15.5)
WBC: 6.2 10*3/uL (ref 4.0–10.5)

## 2012-04-14 LAB — BASIC METABOLIC PANEL
Chloride: 101 mEq/L (ref 96–112)
GFR calc Af Amer: 49 mL/min — ABNORMAL LOW (ref >90)
GFR calc non Af Amer: 43 mL/min — ABNORMAL LOW (ref >90)
Potassium: 3.6 mEq/L (ref 3.5–5.1)
Sodium: 137 mEq/L (ref 135–145)

## 2012-04-14 LAB — MAGNESIUM: Magnesium: 1.4 mg/dL — ABNORMAL LOW (ref 1.5–2.5)

## 2012-04-14 LAB — PHOSPHORUS: Phosphorus: 3.6 mg/dL (ref 2.3–4.6)

## 2012-04-14 MED ORDER — OXYBUTYNIN CHLORIDE 5 MG PO TABS
5.0000 mg | ORAL_TABLET | Freq: Two times a day (BID) | ORAL | Status: DC
  Administered 2012-04-14 – 2012-04-16 (×5): 5 mg
  Filled 2012-04-14 (×7): qty 1

## 2012-04-14 MED ORDER — JEVITY 1.2 CAL PO LIQD
1000.0000 mL | ORAL | Status: DC
  Administered 2012-04-14: 1000 mL
  Filled 2012-04-14 (×5): qty 1000

## 2012-04-14 MED ORDER — CLONIDINE HCL 0.3 MG PO TABS
0.3000 mg | ORAL_TABLET | Freq: Three times a day (TID) | ORAL | Status: DC
  Administered 2012-04-14 – 2012-04-16 (×7): 0.3 mg
  Filled 2012-04-14 (×10): qty 1

## 2012-04-14 MED ORDER — JEVITY 1.2 CAL PO LIQD
1000.0000 mL | ORAL | Status: DC
  Filled 2012-04-14 (×2): qty 1000

## 2012-04-14 MED ORDER — CLONIDINE ORAL SUSPENSION 10 MCG/ML: 0.3000 mg | Freq: Three times a day (TID) | ORAL | Status: DC

## 2012-04-14 MED ORDER — OXYBUTYNIN CHLORIDE 5 MG/5ML PO SYRP: 5.0000 mg | ORAL_SOLUTION | Freq: Two times a day (BID) | ORAL | Status: DC

## 2012-04-14 NOTE — Progress Notes (Signed)
Orders for insertion of foley catheter noted, plan to train patient bladder. Patient noted with urge to urinate around 1100, placed on bedpan, 500 ml of urine noted.  Dr. Thereasa Solo text page and notified about new status and need for discontinuation of foley catheter orders. Foley not inserted at this time.  Patient for transfer to unit 3000, Kim,RN receiving nurse notified and aware, report given, patient transferred to 3000 room 3021, stable upon transfer, all belongings at bedside, family with patient at patient bedside.  Continued with present management and care.

## 2012-04-14 NOTE — Progress Notes (Signed)
Nutrition Follow-up Consult received for the initiation and management of TF. Per MD order nocturnal tube feedings started.    Intervention:   1. Continue to advance nocturnal TF slowly due to pt being at risk for refeeding syndrome given prolonged suboptimal PO intake during both previous acute hospitalization and inpatient rehab admission. Will monitor magnesium, potassium, and phosphorus for at least 3 days and with advancement of diet and nutrition support. 2. Advance Jevity 1.2 to 80 ml/hr x 14 hrs and run from 17:00-07:00 which will provide: 1344 kcal, 62 grams protein, 907 ml H2O. 4. Will monitor labs and if well tolerated will advance to goal rate for nocturnal TF on 6/27 which would run from 14 hours, 17:00-07:00 at a goal rate of 105 ml/hr. Would provide: 1764 kcal, 81 grams protein, 1192 ml H2O.   Plan discussed with MD.   Diet Order:  Regular  SLP eval continues to report that pt has a cognitive-based origin for both increased aspiration risk and poor po intake. Pt is noted to hold foods in her mouth despite several interventions to get pt to swallow. Diet advanced to Regular to allow for pt's favorite foods.  MD has addressed PEG placement with pt's family and this was refused.   Patient has nasoenteric feeding tube in place with tip of tube pointing back towards the distal gastric body.   Jevity 1.2 is ordered @ 60 ml/hr x 14 hours (17:00-07:00). Tube feeding regimen currently providing 1008 kcal, 47 grams protein, and 681 ml H2O.   Residuals: none documented  Free water flushes: 200 ml every 4 hrs providing 1200 of free water.  Total free water: 1881 ml H2O  Last bm: 04/12/12    Meds: Scheduled Meds:    . amLODipine  10 mg Per Tube Daily  . cloNIDine  0.3 mg Per Tube TID  . feeding supplement (JEVITY 1.2 CAL)  1,000 mL Per Tube Q24H  . free water  200 mL Per Tube Q4H  . hydrALAZINE  50 mg Per Tube TID  . labetalol  400 mg Per Tube TID  . levETIRAcetam  500 mg  Per Tube BID  . oxybutynin  5 mg Per Tube BID  . DISCONTD: carvedilol  12.5 mg Per Tube BID WC  . DISCONTD: cloNIDine  0.3 mg Transdermal Weekly  . DISCONTD: cloNIDine  0.3 mg Per Tube TID  . DISCONTD: levetiracetam  500 mg Intravenous Q12H  . DISCONTD: oxybutynin  5 mg Per Tube BID   Continuous Infusions:    . DISCONTD: nitroGLYCERIN Stopped (04/13/12 1541)   PRN Meds:.acetaminophen, labetalol, morphine injection, ondansetron (ZOFRAN) IV  Labs:  CMP     Component Value Date/Time   NA 137 04/14/2012 0415   K 3.6 04/14/2012 0415   CL 101 04/14/2012 0415   CO2 24 04/14/2012 0415   GLUCOSE 115* 04/14/2012 0415   BUN 12 04/14/2012 0415   CREATININE 1.41* 04/14/2012 0415   CALCIUM 9.0 04/14/2012 0415   PROT 7.4 04/12/2012 0730   ALBUMIN 3.5 04/12/2012 0730   AST 14 04/12/2012 0730   ALT 10 04/12/2012 0730   ALKPHOS 99 04/12/2012 0730   BILITOT 0.3 04/12/2012 0730   GFRNONAA 43* 04/14/2012 0415   GFRAA 49* 04/14/2012 0415  CBG (last 3)  No results found for this basename: GLUCAP:3 in the last 72 hours Phosphorus  Date/Time Value Range Status  04/14/2012  4:15 AM 3.6  2.3 - 4.6 mg/dL Final  04/02/2012  3:25 AM 4.1  2.3 - 4.6  mg/dL Final  04/01/2012  3:40 AM 4.8* 2.3 - 4.6 mg/dL Final   Magnesium  Date/Time Value Range Status  04/14/2012  4:15 AM 1.4* 1.5 - 2.5 mg/dL Final  04/02/2012  3:25 AM 1.6  1.5 - 2.5 mg/dL Final  04/01/2012  3:40 AM 1.8  1.5 - 2.5 mg/dL Final    Intake/Output Summary (Last 24 hours) at 04/14/12 1222 Last data filed at 04/14/12 1100  Gross per 24 hour  Intake 2068.18 ml  Output   1333 ml  Net 735.18 ml    Weight Status:   163 lbs 6/26  Estimated needs:  Kcal: 1700 - 1900, Protein: 70 - 85 grams, Fluid: 1.8 - 2 liters daily  Nutrition Dx:  Inadequate oral intake r/t lethargy and poor attention AEB intake of <50% of meal and frequent refusals; ongoing  Goal: Pt to meet >/= 90% of their estimated nutrition needs; not met   Monitor:  Po intake, TF tolerance,  weight   Maylon Peppers, [place], [place], [place] pager

## 2012-04-14 NOTE — Progress Notes (Signed)
[facility] [place] TEAM 1 - Stepdown/ICU TEAM  PCP:  DEFAULT,PROVIDER, MD  Subjective: 51 year old female who was just recently admitted and was found to have a subarachnoid hemorrhage.  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  The pt also underwent the placement of a VP shunt.  During the prior admit, her blood pressure was found to be difficult to be quite difficult to control. Ultimately, the patient was transferred from ICU to rehabilitation on 04/05/2012. Patient was getting feeds through a Panda tube. The tube was discontinued 2 days ago and the patient has had poor intake since then. She was not able to take her medications.  Systolic blood pressure was consistently higher than 200.  This morning the pt is alert, but only minimally conversant, as per baseline.  She denies specific complaints.  Nursing has noted difficulty with urinary retention over the last 36 hours.  I have reviewed the interim progress notes.  Objective:  Intake/Output Summary (Last 24 hours) at 04/14/12 0917 Last data filed at 04/14/12 0800  Gross per 24 hour  Intake 2115.83 ml  Output   1243 ml  Net 872.83 ml   Blood pressure 150/85, pulse 83, temperature 98.6 F (37 C), temperature source Oral, resp. rate 18, height 5\' 1"  (1.549 m), weight 74.1 kg (163 lb 5.8 oz), SpO2 100.00%.  Physical Exam: General: No acute respiratory distress - Panda tube in place Lungs: Clear to auscultation bilaterally without wheezes or crackles Cardiovascular: Regular rate and rhythm without murmur gallop or rub  Abdomen: Nontender, nondistended, soft, bowel sounds positive, no rebound, no ascites, no appreciable mass Extremities: No significant cyanosis, clubbing, or edema bilateral lower extremities  Lab Results:  Basename 04/14/12 0415 04/13/12 0340 04/12/12 0730  NA 137 137 140  K 3.6 3.9 3.5  CL 101 100 102  CO2 24 23 23   GLUCOSE 115* 86 128*  BUN 12 8 8   CREATININE 1.41* 1.13* 1.06    CALCIUM 9.0 9.5 10.0  MG 1.4* -- --  PHOS 3.6 -- --    Basename 04/12/12 0730 04/12/12 0653  AST 14 13  ALT 10 10  ALKPHOS 99 98  BILITOT 0.3 0.3  PROT 7.4 7.5  ALBUMIN 3.5 3.5    Basename 04/14/12 0415 04/13/12 0340 04/12/12 0730  WBC 6.2 8.3 9.4  NEUTROABS -- -- --  HGB 10.3* 11.3* 11.4*  HCT 31.8* 34.3* 35.2*  MCV 87.1 87.5 87.3  PLT 315 362 415*   Studies/Results: All recent x-ray/radiology reports have been reviewed in detail.   Medications: I have reviewed the patient's complete medication list.  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 Cabbell and Rizwan, the family was previously offered a PEG tube and refused - continue Panda feeds and meds via Panda - Dr. 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 and also VP shunt placement No evidence of acute complications  Urinary retention Place foley as pt has required repeat Is/Os - begin anti-spasmotic - attempt voiding trials in 24-48hrs  Recent ventilator dependent respiratory failure  Stable clinically   History of CAD status post stenting  ST elevation MI in Feb 2009 - Left heart cath at that time showed a 70% distal LAD  lesion that was hazy consistent with a plaque rupture - she had a 50% distal circumflex stenosis and a 95% distal RCA stenosis - she did have drug eluting stent placed in her distal RCA   History of chronic kidney disease creatinine 1.4 in 3/10 - follow renal function   Hypercholesterolemia   Dispo Stable for transfer to unit 3000  Cherene Altes, MD [facility] Office  [phone] Pager [phone]  On-Call/Text Page:      [email or web address]      password [facility]

## 2012-04-14 NOTE — Progress Notes (Signed)
Physical Therapy Treatment Patient Details Name: [name] MRN: [id number] DOB: 10-28-1960 Today's Date: 04/14/2012 Time: 1520-1550 PT Time Calculation (min): 30 min  PT Assessment / Plan / Recommendation Comments on Treatment Session  Pt making great progress and able to transfer to 3n1 and take side steps.  Pt continues to be limited due to fatigue.  BP maintained 118/78.    Follow Up Recommendations  Inpatient Rehab    Barriers to Discharge        Equipment Recommendations  Defer to next venue    Recommendations for Other Services    Frequency Min 3X/week   Plan Discharge plan remains appropriate;Frequency remains appropriate    Precautions / Restrictions Precautions Precautions: Fall Precaution Comments: decreased initiation and safety awareness   Pertinent Vitals/Pain No c/o pain    Mobility  Transfers Transfers: Sit to Stand;Stand to Sit;Stand Pivot Transfers Sit to Stand: 1: +2 Total assist;From bed Sit to Stand: Patient Percentage: 60% Stand to Sit: 1: +2 Total assist;To chair/3-in-1 Stand to Sit: Patient Percentage: 60% Stand Pivot Transfers: 1: +2 Total assist Stand Pivot Transfers: Patient Percentage: 70% Details for Transfer Assistance: +2 (A) for safety with cues for hand and LE placement.  Max cues for step sequence. Ambulation/Gait Ambulation/Gait Assistance: 1: +2 Total assist Ambulation/Gait: Patient Percentage: 60% Ambulation Distance (Feet): 5 Feet (side steps) Assistive device: 2 person hand held assist Ambulation/Gait Assistance Details: +2(A) for safety with cues for step sequence; side steps to 3n1;  pt refused further ambulation after returning to recliner due to fatigue    Exercises General Exercises - Lower Extremity Ankle Circles/Pumps: Strengthening;Both;10 reps;Seated Long Arc Quad: AAROM;Strengthening;Both;10 reps;Seated Hip Flexion/Marching: AAROM;Strengthening;Both;10 reps;Seated   PT Diagnosis:    PT Problem List:   PT Treatment  Interventions:     PT Goals Acute Rehab PT Goals PT Goal Formulation: Patient unable to participate in goal setting Time For Goal Achievement: 04/27/12 Potential to Achieve Goals: Good Pt will go Supine/Side to Sit: with supervision PT Goal: Supine/Side to Sit - Progress: Progressing toward goal Pt will Sit at Edge of Bed: Independently;3-5 min PT Goal: Sit at Edge Of Bed - Progress: Progressing toward goal Pt will go Sit to Supine/Side: with supervision PT Goal: Sit to Supine/Side - Progress: Progressing toward goal Pt will go Sit to Stand: with mod assist PT Goal: Sit to Stand - Progress: Progressing toward goal Pt will go Stand to Sit: with mod assist PT Goal: Stand to Sit - Progress: Progressing toward goal Pt will Transfer Bed to Chair/Chair to Bed: with mod assist PT Transfer Goal: Bed to Chair/Chair to Bed - Progress: Progressing toward goal Pt will Stand: 3 - 5 min;with mod assist PT Goal: Stand - Progress: Progressing toward goal Pt will Ambulate: 1 - 15 feet;with rolling walker PT Goal: Ambulate - Progress: Progressing toward goal  Visit Information  Last PT Received On: 04/14/12 Assistance Needed: +2 PT/OT Co-Evaluation/Treatment: Yes    Subjective Data  Subjective: "I don't want to do anymore." Patient Stated Goal: "I want to go home."   Cognition  Overall Cognitive Status: Impaired Area of Impairment: Memory;Attention Arousal/Alertness: Awake/alert Orientation Level: Disoriented to;Time Behavior During Session: Flat affect Current Attention Level: Sustained Problem Solving: slow to process    Balance  Balance Balance Assessed: Yes Static Sitting Balance Static Sitting - Balance Support: Feet supported;Right upper extremity supported Static Sitting - Level of Assistance: 5: Stand by assistance Static Standing Balance Static Standing - Balance Support: No upper extremity supported Static Standing -  Level of Assistance: 1: +2 Total assist;Patient percentage  (comment) (70%) Static Standing - Comment/# of Minutes: +2 (A) for safety and (A) to maintain balance  End of Session PT - End of Session Equipment Utilized During Treatment: Gait belt Activity Tolerance: Patient limited by fatigue Patient left: in chair;with call bell/phone within reach Nurse Communication: Mobility status   GP     Luba Matzen 04/14/2012, [place] PM [name], [place] DPT [phone]

## 2012-04-14 NOTE — Progress Notes (Signed)
Catheter was removed around 1500 today.  Patient had not been able to void after 6 hours post-cath removal.  Bladder scan was performed revealing 338ml residual.  In and out cath was performed with 332ml of urine output.  Six hours later, patient has still not been able to void therefore another in and out cath was performed receiving 45ml of urine output.  Continuing to monitor patient and encouraging to urinate.  Fritz Pickerel, RN

## 2012-04-15 DIAGNOSIS — I10 Essential (primary) hypertension: Secondary | ICD-10-CM

## 2012-04-15 DIAGNOSIS — N189 Chronic kidney disease, unspecified: Secondary | ICD-10-CM

## 2012-04-15 DIAGNOSIS — I2510 Atherosclerotic heart disease of native coronary artery without angina pectoris: Secondary | ICD-10-CM

## 2012-04-15 DIAGNOSIS — R1310 Dysphagia, unspecified: Secondary | ICD-10-CM

## 2012-04-15 LAB — BASIC METABOLIC PANEL
Calcium: 9 mg/dL (ref 8.4–10.5)
GFR calc Af Amer: 58 mL/min — ABNORMAL LOW (ref >90)
GFR calc non Af Amer: 50 mL/min — ABNORMAL LOW (ref >90)
Potassium: 4 mEq/L (ref 3.5–5.1)
Sodium: 138 mEq/L (ref 135–145)

## 2012-04-15 LAB — PHOSPHORUS: Phosphorus: 3.7 mg/dL (ref 2.3–4.6)

## 2012-04-15 MED ORDER — JEVITY 1.2 CAL PO LIQD
1000.0000 mL | ORAL | Status: DC
  Administered 2012-04-15 (×2): 1000 mL
  Filled 2012-04-15 (×19): qty 1000

## 2012-04-15 NOTE — Progress Notes (Signed)
Physical Therapy Treatment Patient Details Name: [name] MRN: [id number] DOB: 06-11-1961 Today's Date: 04/15/2012 Time: [id number] PT Time Calculation (min): 28 min  PT Assessment / Plan / Recommendation Comments on Treatment Session  Pt continues to make progress however needs motivation to perform task.  Pt with very little verbalization and needs cues to redirect to task.  Question if overall mobility limited due to cognition.  Pt continues to state "I want to go home".  However pt does not want to move and PT continues to explain to pt that she has to be able to move before d/c home.    Follow Up Recommendations  Inpatient Rehab    Barriers to Discharge        Equipment Recommendations  Defer to next venue    Recommendations for Other Services Rehab consult  Frequency Min 3X/week   Plan Discharge plan remains appropriate;Frequency remains appropriate    Precautions / Restrictions Precautions Precautions: Fall   Pertinent Vitals/Pain No c/o pain    Mobility  Bed Mobility Bed Mobility: Supine to Sit Supine to Sit: 4: Min guard;HOB elevated;HOB flat (HOB 30 degrees) Details for Bed Mobility Assistance: Minguard for safety with cues for technique Transfers Transfers: Sit to Stand;Stand to Sit Sit to Stand: 4: Min assist Stand to Sit: 4: Min assist Details for Transfer Assistance: (A) to initiate transfer with cues for hand placement and LE placement.  PT needed two attempts to sit -> stand Ambulation/Gait Ambulation/Gait Assistance: 1: +2 Total assist Ambulation/Gait: Patient Percentage: 70% Ambulation Distance (Feet): 10 Feet Assistive device: Rolling walker Ambulation/Gait Assistance Details: +2 (A) for safety with max cues for RW placement and body position within RW.  Pt needs (A) to manage RW Gait Pattern: Decreased stride length;Shuffle;Trunk flexed    Exercises General Exercises - Lower Extremity Long Arc Quad: AAROM;Strengthening;Both;10 reps;Seated Hip  Flexion/Marching: AAROM;Strengthening;Both;10 reps;Seated   PT Diagnosis:    PT Problem List:   PT Treatment Interventions:     PT Goals Acute Rehab PT Goals PT Goal Formulation: Patient unable to participate in goal setting Time For Goal Achievement: 04/27/12 Potential to Achieve Goals: Good Pt will go Supine/Side to Sit: with supervision PT Goal: Supine/Side to Sit - Progress: Progressing toward goal Pt will Sit at Edge of Bed: Independently;3-5 min PT Goal: Sit at Edge Of Bed - Progress: Progressing toward goal Pt will go Sit to Supine/Side: with supervision PT Goal: Sit to Supine/Side - Progress: Progressing toward goal Pt will go Sit to Stand: with mod assist PT Goal: Sit to Stand - Progress: Met Pt will go Stand to Sit: with mod assist PT Goal: Stand to Sit - Progress: Met Pt will Ambulate: 1 - 15 feet;with rolling walker PT Goal: Ambulate - Progress: Progressing toward goal  Visit Information  Last PT Received On: 04/15/12 Assistance Needed: +2 PT/OT Co-Evaluation/Treatment: Yes    Subjective Data  Subjective: "No I don't want to eat."   Cognition  Overall Cognitive Status: Impaired Area of Impairment: Memory;Attention Arousal/Alertness: Awake/alert Orientation Level: Disoriented to;Time Behavior During Session: Flat affect Current Attention Level: Sustained Problem Solving: slow to process Cognition - Other Comments: Pt continues to have flat affect and very little verbalization.  Pt needs max motivation to work with PT and encouraged several times to eat.  Pt first agreeable and the did not want to eat.    Balance  Balance Balance Assessed: Yes Static Sitting Balance Static Sitting - Balance Support: Feet supported;Right upper extremity supported Static Sitting - Level of  Assistance: 5: Stand by assistance  End of Session PT - End of Session Equipment Utilized During Treatment: Gait belt Activity Tolerance: Patient limited by fatigue Patient left: in  chair;with call bell/phone within reach Nurse Communication: Mobility status   GP     Jonothan Heberle 04/15/2012, 3:32 PM [name], [place] DPT [phone]

## 2012-04-15 NOTE — Progress Notes (Signed)
Occupational Therapy Treatment Patient Details Name: [name] MRN: [id number] DOB: 1961-09-24 Today's Date: 04/15/2012 Time: [id number] OT Time Calculation (min): 24 min  OT Assessment / Plan / Recommendation Comments on Treatment Session While pt is progressing toward goals, pt  with very flat affect and requires max encouragement to participate.      Follow Up Recommendations  Inpatient Rehab;Supervision/Assistance - 24 hour    Barriers to Discharge       Equipment Recommendations  Defer to next venue    Recommendations for Other Services Rehab consult  Frequency Min 3X/week   Plan Discharge plan remains appropriate    Precautions / Restrictions Precautions Precautions: Fall   Pertinent Vitals/Pain See vitals    ADL  Toilet Transfer: Performed;+2 Total assistance Toilet Transfer: Patient Percentage: 70% Toilet Transfer Method: Stand pivot Toilet Transfer Equipment:  (EOB to recliner) Toileting - Clothing Manipulation and Hygiene: Performed;Minimal assistance Where Assessed - Best boy and Hygiene: Standing Equipment Used: Gait belt Transfers/Ambulation Related to ADLs: +2 assist for safety ADL Comments: Pt c/o urinary incontinence in bed upon OT arrival.  Pt stood with assist and performed back/front peri care with min assist while standing.      OT Diagnosis:    OT Problem List:   OT Treatment Interventions:     OT Goals ADL Goals Pt Will Transfer to Toilet: with mod assist;Stand pivot transfer;3-in-1 ADL Goal: Toilet Transfer - Progress: Progressing toward goals Additional ADL Goal #2: Pt. will be oriented to place and situation with the use of external cues ADL Goal: Additional Goal #2 - Progress: Progressing toward goals  Visit Information  Last OT Received On: 04/15/12 Assistance Needed: +2    Subjective Data      Prior Functioning       Cognition  Overall Cognitive Status: Impaired Area of Impairment:  Memory;Attention Arousal/Alertness: Awake/alert Orientation Level: Disoriented to;Time Behavior During Session: Flat affect Current Attention Level: Sustained Problem Solving: slow to process Cognition - Other Comments: Pt continues to have flat affect and very little verbalization.  Pt needs max motivation to work with PT and encouraged several times to eat.  Pt first agreeable and the did not want to eat.    Mobility Bed Mobility Bed Mobility: Supine to Sit Supine to Sit: 4: Min guard;HOB elevated;HOB flat (HOB 30 degrees) Details for Bed Mobility Assistance: Minguard for safety with cues for technique Transfers Sit to Stand: 4: Min assist Stand to Sit: 4: Min assist Details for Transfer Assistance: (A) to initiate transfer with cues for hand placement and LE placement.  PT needed two attempts to sit -> stand   Exercises General Exercises - Lower Extremity Long Arc Quad: AAROM;Strengthening;Both;10 reps;Seated Hip Flexion/Marching: AAROM;Strengthening;Both;10 reps;Seated  Balance Balance Balance Assessed: Yes Static Sitting Balance Static Sitting - Balance Support: Feet supported;Right upper extremity supported Static Sitting - Level of Assistance: 5: Stand by assistance  End of Session OT - End of Session Equipment Utilized During Treatment: Gait belt Activity Tolerance: Patient limited by fatigue Patient left: in chair;with call bell/phone within reach;with nursing in room Nurse Communication: Mobility status  GO   04/15/2012 Darrol Jump OTR/L Pager [phone] Office [phone]   Darrol Jump 04/15/2012, 4:58 PM

## 2012-04-15 NOTE — Progress Notes (Addendum)
Nutrition Follow-up Consult received for the initiation and management of TF. Per MD order nocturnal tube feedings started.    Intervention:   1. This PM will advance Jevity 1.2 to goal rate of 105 ml/hr x 14 hours, which will run from 17:00-07:00 and provide: 1764 kcal, 81 grams protein, 1192 ml H2O.    Diet Order:  Regular Per RN pt refused Breakfast this am.  SLP eval continues to report that pt has a cognitive-based origin for both increased aspiration risk and poor po intake. Pt is noted to hold foods in her mouth despite several interventions to get pt to swallow. Diet advanced to Regular to allow for pt's favorite foods.  MD has addressed PEG placement with pt's family and this was refused.   Patient has nasoenteric feeding tube in place with tip of tube pointing back towards the distal gastric body.   Residuals: none documented  Free water flushes: 200 ml every 4 hrs providing 1200 of free water.  Total free water: 2932 ml H2O  Last bm: 04/12/12   Meds: Scheduled Meds:    . amLODipine  10 mg Per Tube Daily  . cloNIDine  0.3 mg Per Tube TID  . free water  200 mL Per Tube Q4H  . hydrALAZINE  50 mg Per Tube TID  . labetalol  400 mg Per Tube TID  . levETIRAcetam  500 mg Per Tube BID  . oxybutynin  5 mg Per Tube BID  . DISCONTD: carvedilol  12.5 mg Per Tube BID WC  . DISCONTD: cloNIDine  0.3 mg Transdermal Weekly  . DISCONTD: cloNIDine  0.3 mg Per Tube TID  . DISCONTD: feeding supplement (JEVITY 1.2 CAL)  1,000 mL Per Tube Q24H  . DISCONTD: feeding supplement (JEVITY 1.2 CAL)  1,000 mL Per Tube Q24H  . DISCONTD: oxybutynin  5 mg Per Tube BID   Continuous Infusions:    . feeding supplement (JEVITY 1.2 CAL) 1,000 mL (04/14/12 2100)  . DISCONTD: nitroGLYCERIN Stopped (04/13/12 1541)   PRN Meds:.acetaminophen, labetalol, morphine injection, ondansetron (ZOFRAN) IV  Labs:  CMP     Component Value Date/Time   NA 138 04/15/2012 0500   K 4.0 04/15/2012 0500   CL 103  04/15/2012 0500   CO2 25 04/15/2012 0500   GLUCOSE 133* 04/15/2012 0500   BUN 15 04/15/2012 0500   CREATININE 1.23* 04/15/2012 0500   CALCIUM 9.0 04/15/2012 0500   PROT 7.4 04/12/2012 0730   ALBUMIN 3.5 04/12/2012 0730   AST 14 04/12/2012 0730   ALT 10 04/12/2012 0730   ALKPHOS 99 04/12/2012 0730   BILITOT 0.3 04/12/2012 0730   GFRNONAA 50* 04/15/2012 0500   GFRAA 58* 04/15/2012 0500  CBG (last 3)  No results found for this basename: GLUCAP:3 in the last 72 hours Phosphorus  Date/Time Value Range Status  04/15/2012  5:00 AM 3.7  2.3 - 4.6 mg/dL Final  04/14/2012  4:15 AM 3.6  2.3 - 4.6 mg/dL Final  04/02/2012  3:25 AM 4.1  2.3 - 4.6 mg/dL Final   Magnesium  Date/Time Value Range Status  04/15/2012  5:00 AM 1.5  1.5 - 2.5 mg/dL Final  04/14/2012  4:15 AM 1.4* 1.5 - 2.5 mg/dL Final  04/02/2012  3:25 AM 1.6  1.5 - 2.5 mg/dL Final    Intake/Output Summary (Last 24 hours) at 04/15/12 0916 Last data filed at 04/15/12 0700  Gross per 24 hour  Intake   1240 ml  Output    500 ml  Net  740 ml    Weight Status:   161 lbs 6/27  Estimated needs:  Kcal: 1700 - 1900, Protein: 70 - 85 grams, Fluid: 1.8 - 2 liters daily  Nutrition Dx:  Inadequate oral intake r/t lethargy and poor attention AEB intake of <50% of meal and frequent refusals; ongoing  Goal: Pt to meet >/= 90% of their estimated nutrition needs; met   Monitor:  Po intake, TF tolerance, weight   Maylon Peppers, [place], [place], [place] pager

## 2012-04-15 NOTE — Progress Notes (Signed)
Speech Language Pathology Dysphagia Treatment Patient Details Name: [name] MRN: [id number] DOB: 11-13-1960 Today's Date: 04/15/2012 Time: [id number] SLP Time Calculation (min): 20 min  Assessment / Plan / Recommendation Clinical Impression  Treatment focused on skilled observation of diet tolerance including cognitive-linguistic skill building with focus on increasing po intake. Patient sleeping upon arrival but aroused quickly with verbal stimuli. Patient able to sustain attention to self feeding task with mod assist for continued intake however was agreeable, cooperative, and consumed 1 banana and 4 ounces of thin liquid with no overt s/s of aspiration. Oral transit of soft solids mildly delayed due to decreased attention however very functiona and without episodes of oral holding or oral residuals. Noted diet advanced. Overall, patient appears to be tolerating current diet. Does continue to be at an increased aspiration risk based on fluctuating mentation. SLP will continue to f/u. Hopeful that if cognition continues to improve, po intake will also improve for ability to avoid long term means of non-oral nutrition.     Diet Recommendation  Continue with Current Diet: Regular;Thin liquid    SLP Plan Continue with current plan of care   Pertinent Vitals/Pain n/a   Swallowing Goals  SLP Swallowing Goals Patient will consume recommended diet without observed clinical signs of aspiration with: Maximum assistance Swallow Study Goal #1 - Progress: Progressing toward goal Patient will utilize recommended strategies during swallow to increase swallowing safety with: Maximum assistance Swallow Study Goal #2 - Progress: Progressing toward goal  General Temperature Spikes Noted: No Respiratory Status: Room air Behavior/Cognition: Alert;Cooperative;Pleasant mood;Decreased sustained attention;Requires cueing;Distractible Oral Cavity - Dentition: Adequate natural dentition Patient Positioning:  Upright in bed     Dysphagia Treatment Treatment focused on: Skilled observation of diet tolerance Treatment Methods/Modalities: Skilled observation Patient observed directly with PO's: Yes Type of PO's observed: Dysphagia 3 (soft);Thin liquids Feeding: Able to feed self Liquids provided via: Straw Oral Phase Signs & Symptoms: Prolonged oral phase (mild but functional) Type of cueing: Verbal Amount of cueing: Moderate   GO   Gabriel Rainwater MA, CCC-SLP [phone]   Gabriel Rainwater Mer[place] 04/15/2012, 12:08 PM

## 2012-04-15 NOTE — Progress Notes (Signed)
Noted patient was admitted to inpt rehab 6/17 through 6/24. Returned to acute rehab and now reconsulted inpt rehab to assess appropriateness to return to CIR.  I have contacted pt/s daughter LePorsche with pt's permission and we have arranged a meeting in the morning at 10 am to discuss further rehabilitation plans. I will also discuss with Dr. Naaman Plummer prior to this meeting. Please call me with any questions. [phone].

## 2012-04-15 NOTE — Progress Notes (Signed)
Patient ID: [name]  female  [id number]    DOB: [birth year]    DOA: 04/12/2012  PCP: William Hamburger, MD  Interim history  51 year old female who was just recently admitted and was found to have a subarachnoid hemorrhage. 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 The pt also underwent the placement of a VP shunt. During the prior admit, her blood pressure was found to be difficult to be quite difficult to control. Ultimately, the patient was transferred from ICU to rehabilitation on 04/05/2012. Patient was getting feeds through a Panda tube. The tube was discontinued 2 days ago and the patient has had poor intake since then. She was not able to take her medications. Systolic blood pressure was consistently higher than 200.  Subjective: Patient is alert but minimally conversant, does not want to eat her food, has PANDA tube in. Appropriately answering yes and no questions.  Objective: Weight change: -0.9 kg (-1 lb 15.7 oz)  Intake/Output Summary (Last 24 hours) at 04/15/12 1359 Last data filed at 04/15/12 0700  Gross per 24 hour  Intake   1040 ml  Output      0 ml  Net   1040 ml   Blood pressure 147/83, pulse 70, temperature 98.5 F (36.9 C), temperature source Oral, resp. rate 18, height 5\' 1"  (1.549 m), weight 73.2 kg (161 lb 6 oz), SpO2 100.00%.  Physical Exam: General: Alert and awake, not in any acute distress. PANDA tube + HEENT: anicteric sclera, pupils reactive to light and accommodation, EOMI CVS: S1-S2 clear, no murmur rubs or gallops Chest: clear to auscultation bilaterally, no wheezing, rales or rhonchi Abdomen: soft nontender, nondistended, normal bowel sounds, no organomegaly Extremities: no cyanosis, clubbing or edema noted bilaterally   Lab Results: Basic Metabolic Panel:  Lab [id number] 0500 04/14/12 0415  NA 138 137  K 4.0 3.6  CL 103 101  CO2 25 24  GLUCOSE 133* 115*  BUN 15 12  CREATININE 1.23* 1.41*  CALCIUM 9.0 9.0  MG 1.5 --   PHOS 3.7 --   Liver Function Tests:  Lab 04/12/12 0730 04/12/12 0653  AST 14 13  ALT 10 10  ALKPHOS 99 98  BILITOT 0.3 0.3  PROT 7.4 7.5  ALBUMIN 3.5 3.5   CBC:  Lab 04/14/12 0415 04/13/12 0340  WBC 6.2 8.3  NEUTROABS -- --  HGB 10.3* 11.3*  HCT 31.8* 34.3*  MCV 87.1 87.5  PLT 315 362   Studies/Results: Ct Head Wo Contrast  04/12/2012  *RADIOLOGY REPORT*  Clinical Data: Headaches in the front and back of head.  CT HEAD WITHOUT CONTRAST  Technique:  Contiguous axial images were obtained from the base of the skull through the vertex without contrast.  Comparison: 04/01/2012  Findings: Postoperative changes with right frontal temporal craniotomy and aneurysm clips over the right middle cerebral and right circle of Willis regions.  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 Right frontal ventriculostomy with trans frontal ventricular shunt tube.  Shunt tube tip is in the region of the third ventricle. Ventricles remain decompressed without significant change since the previous study.  Streak artifact from metallic hardware limits visualization of some areas of brain but the visualized brain parenchyma appears to be homogeneous without mass effect or midline shift.  No abnormal extra-axial fluid collections.  Gray-white matter junctions are distinct.  Basal cisterns are not effaced.  No evidence of acute intracranial hemorrhage.  No significant change since previous  study.  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  No acute intracranial abnormality.  No significant changes since the previous study.  Original Report Authenticated By: Neale Burly, M.D.   Ct Head Wo Contrast  04/01/2012  *RADIOLOGY REPORT*  Clinical Data: Follow-up aneurysm clipping  CT HEAD WITHOUT CONTRAST  Technique:  Contiguous axial images were obtained from the base of the skull through the vertex without contrast.   Comparison: Multiple priors.  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  Ventricular catheter crosses the midline and exits the third ventricle, lying with its tip either in the left cerebral peduncle or interpeduncular notch.  Ventricular size is normal on today's study, significantly improved from 03/24/2012.  Small amount of fluid surrounds the ventricular catheter in its course through the right frontal cortex and subcortical white matter, decreased from priors.  No visible acute stroke, new subarachnoid hemorrhage, mass lesion, or significant extra-axial fluid. 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  Endovascular coil basilar tip grossly unchanged.  IMPRESSION: Satisfactory appearance status post ventriculoperitoneal shunting. Ventricular size now within normal limits.  Original Report Authenticated By: Staci Righter, M.D.   Ct Head Wo Contrast  03/24/2012  *RADIOLOGY REPORT*  Clinical Data: Follow-up intracranial hemorrhage.  CT HEAD WITHOUT CONTRAST  Technique:  Contiguous axial images were obtained from the base of the skull through the vertex without contrast.  Comparison: 03/20/2012.  Findings: Stable ventricular catheter.  The ventricles are mildly enlarged but stable in size.  A small amount of interventricular hemorrhage is again demonstrated.  Persistent diffuse edema with compressed sulci. No subdural hematoma.  Compressed but stable CSF spaces around the brainstem.  IMPRESSION:  1.  Stable ventriculomegaly and diffuse cerebral edema. 2.  Stable ventricular catheter. 3.  Stable intraventricular hemorrhage.  Original Report Authenticated By: P. Kalman Jewels, M.D.   Ct Head Wo Contrast  03/20/2012  *RADIOLOGY REPORT*  Clinical Data: 51 year old female status post drain placement. History of ruptured cerebral aneurysm, subarachnoid hemorrhage, ventriculostomy placement.  CT HEAD WITHOUT  CONTRAST  Technique:  Contiguous axial images were obtained from the base of the skull through the vertex without contrast.  Comparison: 03/14/2012 and earlier.  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  Aneurysm clip and coil pack appears stable in configuration.  Sequelae of right frontotemporal craniotomy. Right frontal burr hole placement. Stable visualized osseous structures.  Visualized orbits and scalp soft tissues are within normal limits.  Right external ventricular drain is stable in position terminating in the right lateral ventricle.  A small volume of intraventricular hemorrhage has decreased.  Ventriculomegaly has not significantly changed.  Continued diffuse cerebral edema.  No acute cortically based infarct.  Stable basilar cisterns.  No midline shift.  IMPRESSION: 1.  Stable ventriculomegaly.  Right CVP placement appears stable. 2.  Decreased small volume of intraventricular hemorrhage.  No new intracranial hemorrhage. 3.  Continued diffuse cerebral edema.  Stable basilar cisterns. 4.  No new intracranial abnormality.  Original Report Authenticated By: Randall An, M.D.   Dg Chest Port 1 View  03/27/2012  *RADIOLOGY REPORT*  Clinical Data: Endotracheal tube position  PORTABLE CHEST - 1 VIEW  Comparison: 03/15/2012  Findings: Endotracheal tube terminates 3 cm above the carina.  Mild left basilar opacity, possibly atelectasis.  Lungs are otherwise clear.  No pleural  effusion or pneumothorax.  Cardiomediastinal silhouette is within normal limits.  Suspected VP shunt catheter coursing along the right hemithorax and terminating in the right upper abdomen.  IMPRESSION: Endotracheal tube terminates 3 cm above the carina.  Original Report Authenticated By: Julian Hy, M.D.   Dg Abd Portable 1v  04/13/2012  *RADIOLOGY REPORT*  Clinical Data: Panda tube advancement  PORTABLE ABDOMEN  - 1 VIEW  Comparison: 04/12/2012  Findings: Weighted feeding tube is looped in the proximal gastric body, kinked at the level of the pylorus, and points back towards the distal gastric body.  Nonobstructive bowel gas pattern.  VP shunt catheter terminating in the right pelvis.  IMPRESSION: Weighted feeding tube is looped/kinked in the stomach and terminates in the distal gastric body, as described above.  Given redundancy, if post pyloric positioning is required, removal and replacement would be necessary.  Original Report Authenticated By: Julian Hy, M.D.   Dg Abd Portable 1v  04/12/2012  *RADIOLOGY REPORT*  Clinical Data: Panda placement  PORTABLE ABDOMEN - 1 VIEW  Comparison: 04/02/2012  Findings: There is nonspecific nonobstructive bowel gas pattern. An  NG feeding tube is noted coiled within stomach with tip in mid stomach. Stable ventriculoperitoneal shunt tubing in the right abdomen.  IMPRESSION: NG feeding tube coiled within stomach with tip in mid stomach.  Original Report Authenticated By: Lahoma Crocker, M.D.   Dg Abd Portable 1v  04/02/2012  *RADIOLOGY REPORT*  Clinical Data: Feeding tube reinsertion.  PORTABLE ABDOMEN - 1 VIEW  Comparison: Chest x-ray 04/02/2012.  Findings: Tip of feeding tube is now in the body of the stomach.  A ventriculoperitoneal shunt tubing is seen projecting over the right upper quadrant of the abdomen.  IMPRESSION: 1.  Tip of feeding tube is now in the body of the stomach.  Original Report Authenticated By: Etheleen Mayhew, M.D.   Dg Abd Portable 1v  04/02/2012  *RADIOLOGY REPORT*  Clinical Data: Feeding tube placement  PORTABLE ABDOMEN - 1 VIEW  Comparison: 03/29/2012  Findings: Feeding tube tip projects over the distal stomach. Nonobstructive bowel gas pattern.  Hemidiaphragms are excluded from the image.  No acute osseous finding.  IMPRESSION: Feeding tube tip projects over the distal stomach.  Original Report Authenticated By: Suanne Marker, M.D.   Dg  Abd Portable 1v  03/29/2012  *RADIOLOGY REPORT*  Clinical Data: Evaluate feeding tube placement  PORTABLE ABDOMEN - 1 VIEW  Comparison: 03/29/2012  Findings: Feeding tube is looped within the stomach.  The tip is within the proximal stomach near the GE junction.  There is a ventriculoperitoneal shunt with tubing coiled in the right upper quadrant of the abdomen.  The bowel gas pattern appears nonobstructed.  IMPRESSION:  1.  The feeding tube is coiled within the stomach with tip in the proximal stomach.  Original Report Authenticated By: Angelita Ingles, M.D.   Dg Abd Portable 1v  03/29/2012  *RADIOLOGY REPORT*  Clinical Data: Feeding tube placement.  PORTABLE ABDOMEN - 1 VIEW  Comparison: None.  Findings: Feeding tube projects over the distal descending duodenum.  Shunt catheter terminates the right upper quadrant.  Gas is seen in the colon and stomach.  IMPRESSION: Feeding tube terminates in the descending duodenum.  Original Report Authenticated By: Luretha Rued, M.D.    Medications: Scheduled Meds:   . amLODipine  10 mg Per Tube Daily  . cloNIDine  0.3 mg Per Tube TID  . free water  200 mL Per Tube Q4H  . hydrALAZINE  50  mg Per Tube TID  . labetalol  400 mg Per Tube TID  . levETIRAcetam  500 mg Per Tube BID  . oxybutynin  5 mg Per Tube BID  . DISCONTD: feeding supplement (JEVITY 1.2 CAL)  1,000 mL Per Tube Q24H  . DISCONTD: feeding supplement (JEVITY 1.2 CAL)  1,000 mL Per Tube Q24H   Continuous Infusions:   . feeding supplement (JEVITY 1.2 CAL) 1,000 mL (04/15/12 0930)  . 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 Cabbell and Rizwan, the family was previously offered a  PEG tube and refused  - continue Panda feeds and meds via Panda - Dr. Christella Noa to speak with family further   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 and also VP shunt placement  No evidence of acute complications, on Keppra   Recent ventilator dependent respiratory failure: resolved   History of CAD status post stenting in 2009 - Cont BP meds  History of chronic kidney disease: Improving   DVT Prophylaxis: SCDs  Code Status: Full code  Disposition: Not medically ready   LOS: 3 days   Zoe Goonan M.D. [facility] 04/15/2012, 1:59 PM Pager: [phone]  If 7PM-7AM, please contact night-coverage [email or web address] Password [email or web address]

## 2012-04-16 DIAGNOSIS — N189 Chronic kidney disease, unspecified: Secondary | ICD-10-CM

## 2012-04-16 DIAGNOSIS — I2510 Atherosclerotic heart disease of native coronary artery without angina pectoris: Secondary | ICD-10-CM

## 2012-04-16 DIAGNOSIS — R1310 Dysphagia, unspecified: Secondary | ICD-10-CM

## 2012-04-16 DIAGNOSIS — I10 Essential (primary) hypertension: Secondary | ICD-10-CM

## 2012-04-16 LAB — MAGNESIUM: Magnesium: 1.5 mg/dL (ref 1.5–2.5)

## 2012-04-16 MED ORDER — LEVETIRACETAM 100 MG/ML PO SOLN
500.0000 mg | Freq: Two times a day (BID) | ORAL | Status: DC
  Administered 2012-04-16 – 2012-04-23 (×15): 500 mg via ORAL
  Filled 2012-04-16 (×17): qty 5

## 2012-04-16 MED ORDER — CLONIDINE HCL 0.3 MG PO TABS
0.3000 mg | ORAL_TABLET | Freq: Three times a day (TID) | ORAL | Status: DC
  Administered 2012-04-16 – 2012-04-21 (×16): 0.3 mg via ORAL
  Filled 2012-04-16 (×20): qty 1

## 2012-04-16 MED ORDER — HYDRALAZINE HCL 50 MG PO TABS
50.0000 mg | ORAL_TABLET | Freq: Three times a day (TID) | ORAL | Status: DC
  Administered 2012-04-16 – 2012-04-22 (×17): 50 mg via ORAL
  Filled 2012-04-16 (×23): qty 1

## 2012-04-16 MED ORDER — OXYBUTYNIN CHLORIDE 5 MG PO TABS
5.0000 mg | ORAL_TABLET | Freq: Two times a day (BID) | ORAL | Status: DC
  Administered 2012-04-16 – 2012-04-23 (×15): 5 mg via ORAL
  Filled 2012-04-16 (×17): qty 1

## 2012-04-16 MED ORDER — AMLODIPINE BESYLATE 10 MG PO TABS
10.0000 mg | ORAL_TABLET | Freq: Every day | ORAL | Status: DC
  Administered 2012-04-16 – 2012-04-26 (×11): 10 mg via ORAL
  Filled 2012-04-16 (×13): qty 1

## 2012-04-16 MED ORDER — LABETALOL HCL 200 MG PO TABS
400.0000 mg | ORAL_TABLET | Freq: Three times a day (TID) | ORAL | Status: DC
  Administered 2012-04-16 – 2012-04-26 (×27): 400 mg via ORAL
  Filled 2012-04-16 (×35): qty 2

## 2012-04-16 NOTE — Progress Notes (Signed)
Pt took morning meds with applesauce. Pt also drank can coke. Under no s/s distress.

## 2012-04-16 NOTE — Progress Notes (Signed)
Agree with progress note and update.  [place], [place] DPT [phone]

## 2012-04-16 NOTE — Progress Notes (Signed)
Nurse tech came to nurse saying pt pulled panda tube out. RN walked in room and discovered Panda lying in bed next to patient while patient was eating eggs.  RN asked pt. If she pulled panda out, pt said yes. RN asked if she wanted it back in. Pt said no. RN asked if pt will continue to eat. Pt shook head yes while continuing to eat eggs. MD made aware. MD wants to keep panda out for now and evaluate patient over weekend to see how she does with eating. Pt currently under no s/s distress.

## 2012-04-16 NOTE — Progress Notes (Signed)
I met with patient, daughter, two sisters, and two nephews at bedside. RN also present. Discussed the possibility of patient readmitting to CIR early next week if she was tolerating more intense therapy and able to maintain nutrition and take her medications without panda. All prefer inpt rehab and not SNF. I have encouraged family to be present during therapy sessions and during meals to assist with patient for she has poor initiation.I will follow up on Monday. Please call [phone] with questions.

## 2012-04-16 NOTE — Progress Notes (Signed)
Occupational Therapy Treatment Patient Details Name: [name] MRN: [id number] DOB: February 13, 1961 Today's Date: 04/16/2012 Time: [id number] OT Time Calculation (min): 17 min  OT Assessment / Plan / Recommendation Comments on Treatment Session Pt demonstrating increased activity tolerance today.  Continues to require max encouragment to participate and flat affect throughout session.    Follow Up Recommendations  Inpatient Rehab;Supervision/Assistance - 24 hour    Barriers to Discharge       Equipment Recommendations  Defer to next venue    Recommendations for Other Services Rehab consult  Frequency Min 3X/week   Plan Discharge plan remains appropriate    Precautions / Restrictions Precautions Precautions: Fall   Pertinent Vitals/Pain See vitals    ADL  Eating/Feeding: Performed;Modified independent Where Assessed - Eating/Feeding: Chair Grooming: Performed;Wash/dry hands;Supervision/safety Where Assessed - Grooming: Unsupported standing Toilet Transfer: Pharmacologist Method: Sit to Loss adjuster, chartered: Other (comment) (chair) Equipment Used: Gait belt;Rolling walker Transfers/Ambulation Related to ADLs: min assist with RW.  moderate verbal cueing for safe use of RW and maintaining upright posture ADL Comments: Pt ambulated to sink to perform grooming tasks. After sitting in chair, pt requested to return to bed.  Encouraged pt to sit up in chair for ~1 hr, and pt agreeable.     OT Diagnosis:    OT Problem List:   OT Treatment Interventions:     OT Goals ADL Goals Pt Will Perform Grooming: Independently;Standing at sink ADL Goal: Grooming - Progress: Updated due to goal met Pt Will Transfer to Toilet: with modified independence;Ambulation;Comfort height toilet;with DME ADL Goal: Toilet Transfer - Progress: Updated due to goal met  Visit Information  Last OT Received On: 04/16/12 Assistance Needed: +1    Subjective Data        Prior Functioning       Cognition  Overall Cognitive Status: Impaired Area of Impairment: Memory;Attention Arousal/Alertness: Awake/alert Orientation Level: Appears intact for tasks assessed Behavior During Session: [facility] for tasks performed Current Attention Level: Sustained    Mobility Bed Mobility Supine to Sit: 5: Supervision;With rails Details for Bed Mobility Assistance: Cues for technique Transfers Sit to Stand: From bed;With upper extremity assist;4: Min assist Stand to Sit: To chair/3-in-1;With upper extremity assist;4: Min assist;With armrests Details for Transfer Assistance: Cues for safe hand placement and not to pull up on RW. A for safety and not to pull up on RW   Exercises    Balance    End of Session OT - End of Session Equipment Utilized During Treatment: Gait belt Activity Tolerance: Patient tolerated treatment well Patient left: in chair;with call bell/phone within reach;with family/visitor present Nurse Communication: Mobility status  GO    04/16/2012 Darrol Jump OTR/L Pager [phone] Office [phone]  Darrol Jump 04/16/2012, 4:01 PM

## 2012-04-16 NOTE — Progress Notes (Signed)
Pt has been eating well. Pt at about 15% lunch and 25-30% of dinner. Slowly progressing. Pt also has been drinking cokes and taking pills.

## 2012-04-16 NOTE — Progress Notes (Signed)
Patient ID: [name]  female  [id number]    DOB: [birth year]    DOA: 04/12/2012  PCP: William Hamburger, MD  Interim history  51 year old female who was just recently admitted and was found to have a subarachnoid hemorrhage. 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 The pt also underwent the placement of a VP shunt. During the prior admit, her blood pressure was found to be difficult to be quite difficult to control. Ultimately, the patient was transferred from ICU to rehabilitation on 04/05/2012. Patient was getting feeds through a Panda tube. Patient was transferred to the floor from step down unit on 04/14/2012. Patient has been receiving tube feeds via PANDA, speech therapy has been following her for her diet. The patient appears to have cognitive deficit and lack of motivation which was causing poor PO intake and anorexia but was tolerating regular diet when she eats. Dr. Drucilla Schmidt on had discussed with Dr. Christella Noa had recommended PEG tube in the past to improve her nutritional status however family had declined it. This morning, patient pulled out her PANDA tube and was noticed by the nurse eating her breakfast by herself.    Subjective: Patient is alert but minimally conversant, does not want to eat her food, had PANDA tube in when I had examined her earlier this morning. But later at 9 AM, RN reported that the patient pulled out PANDA tube and spontaneously started eating breakfast by herself. Appropriately answering yes and no questions.  Objective: Weight change:   Intake/Output Summary (Last 24 hours) at 04/16/12 1129 Last data filed at 04/16/12 0357  Gross per 24 hour  Intake    210 ml  Output      0 ml  Net    210 ml   Blood pressure 126/68, pulse 68, temperature 98.4 F (36.9 C), temperature source Oral, resp. rate 18, height 5\' 1"  (1.549 m), weight 73.2 kg (161 lb 6 oz), SpO2 100.00%.  Physical Exam: General: Alert and awake, not in any acute distress.  PANDA tube + HEENT: anicteric sclera, pupils reactive to light and accommodation, EOMI CVS: S1-S2 clear, no murmur rubs or gallops Chest: clear to auscultation bilaterally, no wheezing, rales or rhonchi Abdomen: soft nontender, nondistended, normal bowel sounds, no organomegaly Extremities: no cyanosis, clubbing or edema noted bilaterally   Lab Results: Basic Metabolic Panel:  Lab [id number] 0700 04/15/12 0500 04/14/12 0415  NA -- 138 137  K -- 4.0 3.6  CL -- 103 101  CO2 -- 25 24  GLUCOSE -- 133* 115*  BUN -- 15 12  CREATININE -- 1.23* 1.41*  CALCIUM -- 9.0 9.0  MG 1.5 -- --  PHOS 2.8 -- --   Liver Function Tests:  Lab 04/12/12 0730 04/12/12 0653  AST 14 13  ALT 10 10  ALKPHOS 99 98  BILITOT 0.3 0.3  PROT 7.4 7.5  ALBUMIN 3.5 3.5   CBC:  Lab 04/14/12 0415 04/13/12 0340  WBC 6.2 8.3  NEUTROABS -- --  HGB 10.3* 11.3*  HCT 31.8* 34.3*  MCV 87.1 87.5  PLT 315 362   Studies/Results: Ct Head Wo Contrast  04/12/2012  *RADIOLOGY REPORT*  Clinical Data: Headaches in the front and back of head.  CT HEAD WITHOUT CONTRAST  Technique:  Contiguous axial images were obtained from the base of the skull through the vertex without contrast.  Comparison: 04/01/2012  Findings: Postoperative changes with right frontal temporal craniotomy and aneurysm clips over the right middle cerebral and right circle of  Willis regions.  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 Right frontal ventriculostomy with trans frontal ventricular shunt tube.  Shunt tube tip is in the region of the third ventricle. Ventricles remain decompressed without significant change since the previous study.  Streak artifact from metallic hardware limits visualization of some areas of brain but the visualized brain parenchyma appears to be homogeneous without mass effect or midline shift.  No abnormal extra-axial fluid collections.  Gray-white matter junctions are distinct.   Basal cisterns are not effaced.  No evidence of acute intracranial hemorrhage.  No significant change since previous study.  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  No acute intracranial abnormality.  No significant changes since the previous study.  Original Report Authenticated By: Neale Burly, M.D.   Ct Head Wo Contrast  04/01/2012  *RADIOLOGY REPORT*  Clinical Data: Follow-up aneurysm clipping  CT HEAD WITHOUT CONTRAST  Technique:  Contiguous axial images were obtained from the base of the skull through the vertex without contrast.  Comparison: Multiple priors.  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  Ventricular catheter crosses the midline and exits the third ventricle, lying with its tip either in the left cerebral peduncle or interpeduncular notch.  Ventricular size is normal on today's study, significantly improved from 03/24/2012.  Small amount of fluid surrounds the ventricular catheter in its course through the right frontal cortex and subcortical white matter, decreased from priors.  No visible acute stroke, new subarachnoid hemorrhage, mass lesion, or significant extra-axial fluid. 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  Endovascular coil basilar tip grossly unchanged.  IMPRESSION: Satisfactory appearance status post ventriculoperitoneal shunting. Ventricular size now within normal limits.  Original Report Authenticated By: Staci Righter, M.D.   Ct Head Wo Contrast  03/24/2012  *RADIOLOGY REPORT*  Clinical Data: Follow-up intracranial hemorrhage.  CT HEAD WITHOUT CONTRAST  Technique:  Contiguous axial images were obtained from the base of the skull through the vertex without contrast.  Comparison: 03/20/2012.  Findings: Stable ventricular catheter.  The ventricles are mildly enlarged but stable in size.  A small amount of  interventricular hemorrhage is again demonstrated.  Persistent diffuse edema with compressed sulci. No subdural hematoma.  Compressed but stable CSF spaces around the brainstem.  IMPRESSION:  1.  Stable ventriculomegaly and diffuse cerebral edema. 2.  Stable ventricular catheter. 3.  Stable intraventricular hemorrhage.  Original Report Authenticated By: P. Kalman Jewels, M.D.   Ct Head Wo Contrast  03/20/2012  *RADIOLOGY REPORT*  Clinical Data: 51 year old female status post drain placement. History of ruptured cerebral aneurysm, subarachnoid hemorrhage, ventriculostomy placement.  CT HEAD WITHOUT CONTRAST  Technique:  Contiguous axial images were obtained from the base of the skull through the vertex without contrast.  Comparison: 03/14/2012 and earlier.  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  Aneurysm clip and coil pack appears stable in configuration.  Sequelae of right frontotemporal craniotomy. Right frontal burr hole placement. Stable visualized osseous structures.  Visualized orbits and scalp soft tissues are within normal limits.  Right external ventricular drain is stable in position terminating in the right lateral ventricle.  A small volume of intraventricular hemorrhage has decreased.  Ventriculomegaly has not significantly changed.  Continued diffuse cerebral edema.  No acute cortically based infarct.  Stable  basilar cisterns.  No midline shift.  IMPRESSION: 1.  Stable ventriculomegaly.  Right CVP placement appears stable. 2.  Decreased small volume of intraventricular hemorrhage.  No new intracranial hemorrhage. 3.  Continued diffuse cerebral edema.  Stable basilar cisterns. 4.  No new intracranial abnormality.  Original Report Authenticated By: Randall An, M.D.   Dg Chest Port 1 View  03/27/2012  *RADIOLOGY REPORT*  Clinical Data: Endotracheal tube position  PORTABLE CHEST - 1  VIEW  Comparison: 03/15/2012  Findings: Endotracheal tube terminates 3 cm above the carina.  Mild left basilar opacity, possibly atelectasis.  Lungs are otherwise clear.  No pleural effusion or pneumothorax.  Cardiomediastinal silhouette is within normal limits.  Suspected VP shunt catheter coursing along the right hemithorax and terminating in the right upper abdomen.  IMPRESSION: Endotracheal tube terminates 3 cm above the carina.  Original Report Authenticated By: Julian Hy, M.D.   Dg Abd Portable 1v  04/13/2012  *RADIOLOGY REPORT*  Clinical Data: Panda tube advancement  PORTABLE ABDOMEN - 1 VIEW  Comparison: 04/12/2012  Findings: Weighted feeding tube is looped in the proximal gastric body, kinked at the level of the pylorus, and points back towards the distal gastric body.  Nonobstructive bowel gas pattern.  VP shunt catheter terminating in the right pelvis.  IMPRESSION: Weighted feeding tube is looped/kinked in the stomach and terminates in the distal gastric body, as described above.  Given redundancy, if post pyloric positioning is required, removal and replacement would be necessary.  Original Report Authenticated By: Julian Hy, M.D.   Dg Abd Portable 1v  04/12/2012  *RADIOLOGY REPORT*  Clinical Data: Panda placement  PORTABLE ABDOMEN - 1 VIEW  Comparison: 04/02/2012  Findings: There is nonspecific nonobstructive bowel gas pattern. An  NG feeding tube is noted coiled within stomach with tip in mid stomach. Stable ventriculoperitoneal shunt tubing in the right abdomen.  IMPRESSION: NG feeding tube coiled within stomach with tip in mid stomach.  Original Report Authenticated By: Lahoma Crocker, M.D.   Dg Abd Portable 1v  04/02/2012  *RADIOLOGY REPORT*  Clinical Data: Feeding tube reinsertion.  PORTABLE ABDOMEN - 1 VIEW  Comparison: Chest x-ray 04/02/2012.  Findings: Tip of feeding tube is now in the body of the stomach.  A ventriculoperitoneal shunt tubing is seen projecting over the right  upper quadrant of the abdomen.  IMPRESSION: 1.  Tip of feeding tube is now in the body of the stomach.  Original Report Authenticated By: Etheleen Mayhew, M.D.   Dg Abd Portable 1v  04/02/2012  *RADIOLOGY REPORT*  Clinical Data: Feeding tube placement  PORTABLE ABDOMEN - 1 VIEW  Comparison: 03/29/2012  Findings: Feeding tube tip projects over the distal stomach. Nonobstructive bowel gas pattern.  Hemidiaphragms are excluded from the image.  No acute osseous finding.  IMPRESSION: Feeding tube tip projects over the distal stomach.  Original Report Authenticated By: Suanne Marker, M.D.   Dg Abd Portable 1v  03/29/2012  *RADIOLOGY REPORT*  Clinical Data: Evaluate feeding tube placement  PORTABLE ABDOMEN - 1 VIEW  Comparison: 03/29/2012  Findings: Feeding tube is looped within the stomach.  The tip is within the proximal stomach near the GE junction.  There is a ventriculoperitoneal shunt with tubing coiled in the right upper quadrant of the abdomen.  The bowel gas pattern appears nonobstructed.  IMPRESSION:  1.  The feeding tube is coiled within the stomach with tip in the proximal stomach.  Original Report Authenticated By: Angelita Ingles, M.D.   Dg  Abd Portable 1v  03/29/2012  *RADIOLOGY REPORT*  Clinical Data: Feeding tube placement.  PORTABLE ABDOMEN - 1 VIEW  Comparison: None.  Findings: Feeding tube projects over the distal descending duodenum.  Shunt catheter terminates the right upper quadrant.  Gas is seen in the colon and stomach.  IMPRESSION: Feeding tube terminates in the descending duodenum.  Original Report Authenticated By: Luretha Rued, M.D.    Medications: Scheduled Meds:    . amLODipine  10 mg Per Tube Daily  . cloNIDine  0.3 mg Per Tube TID  . free water  200 mL Per Tube Q4H  . hydrALAZINE  50 mg Per Tube TID  . labetalol  400 mg Per Tube TID  . levETIRAcetam  500 mg Per Tube BID  . oxybutynin  5 mg Per Tube BID   Continuous Infusions:    . 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 - as per Drs. Cabbell and Rizwan, the family was previously offered a PEG tube and refused  - encourage PO diet, if patient continues to eat well over the weekend, may not need PEG or reinsertion of PANDA.   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 and also VP shunt placement  No evidence of acute complications, on Keppra   Recent ventilator dependent respiratory failure: resolved   History of CAD status post stenting in 2009 - Cont BP meds  History of chronic kidney disease: Improving   DVT Prophylaxis: SCDs  Code Status: Full code  Disposition: Inpatient rehab on Monday if patient is eating well vs placing PEG tube on Monday.    LOS: 4 days   Taitum Alms M.D. [facility] 04/16/2012, 11:29 AM Pager: [phone]  If 7PM-7AM, please contact night-coverage [email or web address] Password [email or web address]

## 2012-04-16 NOTE — Progress Notes (Addendum)
Physical Therapy Treatment Patient Details Name: [name] MRN: [id number] DOB: [birth year] Today's Date: 04/16/2012 Time: [id number] PT Time Calculation (min): 17 min  PT Assessment / Plan / Recommendation Comments on Treatment Session  Patient continuing to make progress with therapy however does require some encouragment to increase activity and stay up in recliner. Patient with flat affect throughout session. Patient states, "I am ready to go home" Patients daughters present throughout session and helped with motivating patient.     Follow Up Recommendations  Inpatient Rehab    Barriers to Discharge        Equipment Recommendations  Defer to next venue    Recommendations for Other Services Rehab consult  Frequency Min 3X/week   Plan Discharge plan remains appropriate    Precautions / Restrictions Precautions Precautions: Fall   Pertinent Vitals/Pain     Mobility  Bed Mobility Supine to Sit: 5: Supervision;With rails Details for Bed Mobility Assistance: Cues for technique Transfers Sit to Stand: From bed;With upper extremity assist;4: Min assist Stand to Sit: To chair/3-in-1;With upper extremity assist;4: Min assist;With armrests Details for Transfer Assistance: Cues for safe hand placement and not to pull up on RW. A for safety and not to pull up on RW Ambulation/Gait Ambulation/Gait Assistance: 4: Min assist Ambulation Distance (Feet): 60 Feet Assistive device: Rolling walker Ambulation/Gait Assistance Details: Cues for posture and safe use of RW. A for balance and management of RW Gait Pattern: Step-through pattern;Decreased stride length;Wide base of support Gait velocity: decreased General Gait Details: Patient tends to keep RW pushed too far out in front of her making her ambulate with kyphotic posture.     Exercises     PT Diagnosis:    PT Problem List:   PT Treatment Interventions:     PT Goals Acute Rehab PT Goals PT Goal Formulation: With  patient Time For Goal Achievement: 04/30/12 Potential to Achieve Goals: Good Pt will go Supine/Side to Sit: with modified independence PT Goal: Supine/Side to Sit - Progress: Met PT Goal: Sit at Edge Of Bed - Progress: Met Pt will go Sit to Supine/Side: with modified independence PT Goal: Sit to Supine/Side - Progress: Goal set today Pt will go Sit to Stand: with modified independence PT Goal: Sit to Stand - Progress: Goal set today Pt will go Stand to Sit: with modified independence PT Goal: Stand to Sit - Progress: Goal set today PT Transfer Goal: Bed to Chair/Chair to Bed - Progress: Met PT Goal: Stand - Progress: Progressing toward goal Pt will Ambulate: 51 - 150 feet;with supervision;with rolling walker PT Goal: Ambulate - Progress: Goal set today  Visit Information  Last PT Received On: 04/16/12 Assistance Needed: +1    Subjective Data      Cognition  Overall Cognitive Status: Impaired Area of Impairment: Memory;Attention Arousal/Alertness: Awake/alert Orientation Level: Appears intact for tasks assessed Behavior During Session: [facility] for tasks performed Current Attention Level: Sustained    Balance     End of Session PT - End of Session Equipment Utilized During Treatment: Gait belt Activity Tolerance: Patient tolerated treatment well;Patient limited by fatigue Patient left: in chair Nurse Communication: Mobility status       Jacqualyn Posey 04/16/2012, 2:07 PM  04/16/2012 Jacqualyn Posey PTA [phone] pager [phone] office

## 2012-04-17 DIAGNOSIS — I10 Essential (primary) hypertension: Secondary | ICD-10-CM

## 2012-04-17 DIAGNOSIS — R1310 Dysphagia, unspecified: Secondary | ICD-10-CM

## 2012-04-17 DIAGNOSIS — N189 Chronic kidney disease, unspecified: Secondary | ICD-10-CM

## 2012-04-17 DIAGNOSIS — I2510 Atherosclerotic heart disease of native coronary artery without angina pectoris: Secondary | ICD-10-CM

## 2012-04-17 NOTE — Progress Notes (Signed)
Patient ID: [name], female   DOB: 1961/08/02, 51 y.o.   MRN: [id number]  [facility] PROGRESS NOTE  [name] [id number] DOB: 1961/09/18 DOA: 04/12/2012 PCP: William Hamburger, MD  Brief narrative: Pt is 51 year old female who was admitted 04/13/2012 with uncontrolled hypertension and was found to have a subarachnoid hemorrhage. 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 The pt also underwent the placement of a VP shunt. Pt was recently discharge from the hospital to rehabilitation on 04/05/2012 and was getting feeds through a Panda tube.  Consultants:  none  Procedures:  none  Antibiotics:  none  HPI/Subjective: No events overnight. Pt rather somnolent this AM.   Objective: Filed Vitals:   04/16/12 1345 04/16/12 2341 04/17/12 0648 04/17/12 1350  BP: 165/87 136/80 169/94 159/89  Pulse: 68 72 69 63  Temp: 98.3 F (36.8 C) 98.3 F (36.8 C) 97.9 F (36.6 C) 98.2 F (36.8 C)  TempSrc: Oral Oral Oral Oral  Resp:  20 18 18   Height:      Weight:      SpO2: 100% 100% 100% 94%    Intake/Output Summary (Last 24 hours) at 04/17/12 1525 Last data filed at 04/16/12 2200  Gross per 24 hour  Intake    240 ml  Output      0 ml  Net    240 ml    Exam:   General:  Pt is somewhat somnolent and opens eyes when called by name, follows some commands.   Cardiovascular: Regular rate and rhythm, S1/S2, no murmurs, no rubs, no gallops  Respiratory: Clear to auscultation bilaterally but breath sounds are diminished at bases, no wheezing, no crackles, no rhonchi  Abdomen: Soft, non tender, non distended, bowel sounds present, no guarding  Extremities: No edema, pulses DP and PT palpable bilaterally  Data Reviewed: Basic Metabolic Panel:  Lab [id number] 04/15/12 0500 04/14/12 0415 04/13/12 0340 04/12/12 0730 04/12/12 0653  NA -- 138 137 137 140 141  K -- 4.0 3.6 3.9 3.5 3.5  CL -- 103 101 100 102 102  CO2 -- 25 24 23 23 24   GLUCOSE --  133* 115* 86 128* 123*  BUN -- 15 12 8 8 8   CREATININE -- 1.23* 1.41* 1.13* 1.06 1.10  CALCIUM -- 9.0 9.0 9.5 10.0 10.1  MG 1.5 1.5 1.4* -- -- --  PHOS 2.8 3.7 3.6 -- -- --   Liver Function Tests:  Lab 04/12/12 0730 04/12/12 0653  AST 14 13  ALT 10 10  ALKPHOS 99 98  BILITOT 0.3 0.3  PROT 7.4 7.5  ALBUMIN 3.5 3.5   CBC:  Lab 04/14/12 0415 04/13/12 0340 04/12/12 0730 04/12/12 0653  WBC 6.2 8.3 9.4 9.4  NEUTROABS -- -- -- --  HGB 10.3* 11.3* 11.4* 11.3*  HCT 31.8* 34.3* 35.2* 34.6*  MCV 87.1 87.5 87.3 87.2  PLT 315 362 415* 396    Studies: No results found.  Scheduled Meds:   . amLODipine  10 mg Oral Daily  . cloNIDine  0.3 mg Oral TID  . hydrALAZINE  50 mg Oral TID  . labetalol  400 mg Oral TID  . levETIRAcetam  500 mg Oral BID  . oxybutynin  5 mg Oral BID   Continuous Infusions:   . 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  - as per Drs. Cabbell and Rizwan, the family was previously offered a PEG tube and refused  - encourage PO diet, if patient continues to eat well over the weekend, may not need PEG or reinsertion of PANDA.   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 and also VP shunt placement  - No evidence of acute complications, on Keppra   Recent ventilator dependent respiratory failure:  - resolved  - pt maintaining oxygen saturations > 93% on RA  History of CAD status post stenting in 2009  - Cont BP meds   History of chronic kidney disease: stage II - Improving  - BMP in AM  Anemia of chronic disease - Hg and Hct are stable  and at pt's baseline - CBC in AM  Code Status: Full Family Communication: Pt at bedside Disposition Plan: IR on Monday if available  Faye Ramsay, MD  [facility] Pager [phone]  If 7PM-7AM, please contact night-coverage [email or web address] Password [email or web address] 04/17/2012, 3:25 PM   LOS: 5 days

## 2012-04-18 DIAGNOSIS — I10 Essential (primary) hypertension: Secondary | ICD-10-CM

## 2012-04-18 DIAGNOSIS — N189 Chronic kidney disease, unspecified: Secondary | ICD-10-CM

## 2012-04-18 DIAGNOSIS — R1310 Dysphagia, unspecified: Secondary | ICD-10-CM

## 2012-04-18 DIAGNOSIS — I2510 Atherosclerotic heart disease of native coronary artery without angina pectoris: Secondary | ICD-10-CM

## 2012-04-18 LAB — CBC
MCH: 28.6 pg (ref 26.0–34.0)
WBC: 7.6 10*3/uL (ref 4.0–10.5)

## 2012-04-18 LAB — BASIC METABOLIC PANEL
BUN: 14 mg/dL (ref 6–23)
CO2: 26 mEq/L (ref 19–32)
Calcium: 9.8 mg/dL (ref 8.4–10.5)
Glucose, Bld: 114 mg/dL — ABNORMAL HIGH (ref 70–99)
Sodium: 138 mEq/L (ref 135–145)

## 2012-04-18 NOTE — Progress Notes (Signed)
Patient ID: [name], female   DOB: 1961/04/14, 51 y.o.   MRN: [id number]  [facility] PROGRESS NOTE  [name] [id number] DOB: 03-03-1961 DOA: 04/12/2012 PCP: William Hamburger, MD  Brief narrative:  Pt is 51 year old female who was admitted 04/13/2012 with uncontrolled hypertension and was found to have a subarachnoid hemorrhage. 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 The pt also underwent the placement of a VP shunt. Pt was recently discharge from the hospital to rehabilitation on 04/05/2012 and was getting feeds through a Panda tube.   Consultants:  PM&R - consult for inpatient rehabilitation Procedures:  none Antibiotics:  none  HPI/Subjective: No events overnight.   Objective: Filed Vitals:   04/17/12 1350 04/17/12 2232 04/18/12 0600 04/18/12 0929  BP: 159/89 149/90 149/84 180/99  Pulse: 63 92 64 60  Temp: 98.2 F (36.8 C) 97.8 F (36.6 C) 97.1 F (36.2 C) 98.3 F (36.8 C)  TempSrc: Oral Oral  Oral  Resp: 18 18 18 18   Height:      Weight:   75.2 kg (165 lb 12.6 oz)   SpO2: 94% 100% 97% 99%    Intake/Output Summary (Last 24 hours) at 04/18/12 1001 Last data filed at 04/17/12 1900  Gross per 24 hour  Intake    220 ml  Output      0 ml  Net    220 ml    Exam:   General:  Pt is alert, follows commands appropriately, not in acute distress  Cardiovascular: Regular rate and rhythm, S1/S2, no murmurs, no rubs, no gallops  Respiratory: Clear to auscultation bilaterally, no wheezing, no crackles, no rhonchi  Abdomen: Soft, non tender, non distended, bowel sounds present, no guarding  Extremities: No edema, pulses DP and PT palpable bilaterally  Data Reviewed: Basic Metabolic Panel:  Lab [id number] 04/16/12 0700 04/15/12 0500 04/14/12 0415 04/13/12 0340 04/12/12 0730  NA 138 -- 138 137 137 140  K 4.5 -- 4.0 3.6 3.9 3.5  CL 101 -- 103 101 100 102  CO2 26 -- 25 24 23 23   GLUCOSE 114* -- 133* 115* 86 128*  BUN 14 --  15 12 8 8   CREATININE 1.26* -- 1.23* 1.41* 1.13* 1.06  CALCIUM 9.8 -- 9.0 9.0 9.5 10.0  MG -- 1.5 1.5 1.4* -- --  PHOS -- 2.8 3.7 3.6 -- --   Liver Function Tests:  Lab 04/12/12 0730 04/12/12 0653  AST 14 13  ALT 10 10  ALKPHOS 99 98  BILITOT 0.3 0.3  PROT 7.4 7.5  ALBUMIN 3.5 3.5   CBC:  Lab 04/18/12 0605 04/14/12 0415 04/13/12 0340 04/12/12 0730 04/12/12 0653  WBC 7.6 6.2 8.3 9.4 9.4  NEUTROABS -- -- -- -- --  HGB 11.2* 10.3* 11.3* 11.4* 11.3*  HCT 34.3* 31.8* 34.3* 35.2* 34.6*  MCV 87.5 87.1 87.5 87.3 87.2  PLT 283 315 362 415* 396   Studies: No results found.  Scheduled Meds:   . amLODipine  10 mg Oral Daily  . cloNIDine  0.3 mg Oral TID  . hydrALAZINE  50 mg Oral TID  . labetalol  400 mg Oral TID  . levETIRAcetam  500 mg Oral BID  . oxybutynin  5 mg Oral BID   Continuous Infusions:   . 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  - as per Drs. Cabbell and Rizwan, the family was previously offered a PEG tube and refused  - encourage PO diet, if patient continues to eat well over the weekend, may not need PEG or reinsertion of PANDA.   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 and also VP shunt placement  - No evidence of acute complications, on Keppra   Recent ventilator dependent respiratory failure:  - resolved  - pt maintaining oxygen saturations > 93% on RA   Acute on chronic diastolic CHF exacerbation, last 2 d ECHO [id number] with EF 55% - grade II diastolic dysfunction - clinically stable  Protein calorie malnutrition - secondary  to dysphagia in the setting of brain injury, progressive failure to thrive - SLP evaluation  History of CAD status post stenting in 2009  - Cont BP meds   History of chronic kidney disease: stage II  - Creatinine is at pt's baseline - BMP in AM   Anemia of chronic disease  - Hg and Hct are stable and at pt's baseline  - CBC in AM   Code Status: Full  Family Communication: Pt at bedside  Disposition Plan: IR on Monday if available    Faye Ramsay, MD  [facility] Pager [phone]  If 7PM-7AM, please contact night-coverage [email or web address] Password [email or web address] 04/18/2012, 10:01 AM   LOS: 6 days

## 2012-04-18 NOTE — Progress Notes (Signed)
Encouraged patient to eat meals throughout shift today.  Assisted patient to prepare food.  Patient took one bite of breakfast and stated "I already ate breakfast.  I had pancakes with my grandchildren."  Patient did not want to eat lunch.  Assisted patient to order desired food from the menu.  Patient did not want to eat sandwich upon arrival and stated "I just too tired to eat.  You know I want to be left alone on Sundays".  Prompted patient for toileting throughout shift.  Assisted patient to [facility].  Patient impulsive with movements.  Attempted to walk patient and she refused three times throughout shift.  Will continue to monitor.  Leighton Roach, RN---------------

## 2012-04-19 ENCOUNTER — Inpatient Hospital Stay (HOSPITAL_COMMUNITY): Payer: No Typology Code available for payment source

## 2012-04-19 DIAGNOSIS — J9600 Acute respiratory failure, unspecified whether with hypoxia or hypercapnia: Secondary | ICD-10-CM

## 2012-04-19 DIAGNOSIS — I10 Essential (primary) hypertension: Secondary | ICD-10-CM

## 2012-04-19 DIAGNOSIS — I5032 Chronic diastolic (congestive) heart failure: Secondary | ICD-10-CM

## 2012-04-19 DIAGNOSIS — N189 Chronic kidney disease, unspecified: Secondary | ICD-10-CM

## 2012-04-19 LAB — CBC
Hemoglobin: 11.1 g/dL — ABNORMAL LOW (ref 12.0–15.0)
MCH: 28.5 pg (ref 26.0–34.0)
MCV: 87.9 fL (ref 78.0–100.0)
Platelets: 304 10*3/uL (ref 150–400)
RBC: 3.89 MIL/uL (ref 3.87–5.11)

## 2012-04-19 LAB — BASIC METABOLIC PANEL
CO2: 25 mEq/L (ref 19–32)
Calcium: 9.6 mg/dL (ref 8.4–10.5)
Creatinine, Ser: 1.39 mg/dL — ABNORMAL HIGH (ref 0.50–1.10)
Glucose, Bld: 130 mg/dL — ABNORMAL HIGH (ref 70–99)

## 2012-04-19 MED ORDER — FREE WATER
200.0000 mL | Freq: Four times a day (QID) | Status: DC
  Administered 2012-04-21: 200 mL
  Filled 2012-04-19: qty 200

## 2012-04-19 NOTE — Progress Notes (Signed)
Discussed with patient on 2 separate occasions the need for a Panda tube and patient refuses to let us proceed with placement.  Dr. Neomia Dear sent a text message to inform of patient's refusal. Lemont Fillers 4:00 PM

## 2012-04-19 NOTE — Progress Notes (Signed)
Patient ID: [name], female   DOB: Apr 18, 1961, 51 y.o.   MRN: [id number]  [facility] PROGRESS NOTE  [name] [id number] DOB: Dec 01, 1960 DOA: 04/12/2012 PCP: William Hamburger, MD  Brief narrative:  Pt is 51 year old female who was admitted 04/13/2012 with uncontrolled hypertension and was found to have a subarachnoid hemorrhage. 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 The pt also underwent the placement of a VP shunt. Pt was recently discharge from the hospital to rehabilitation on 04/05/2012 and was getting feeds through a Panda tube.   Consultants:  PM&R - consult for inpatient rehabilitation Procedures:  none Antibiotics:  none  HPI/Subjective: No events overnight. Continues to refuses PT and eating.   Objective: Filed Vitals:   04/19/12 0600 04/19/12 0953 04/19/12 1125 04/19/12 1429  BP: 138/74 155/86 155/86 112/76  Pulse: 88 62  73  Temp: 98 F (36.7 C) 97.4 F (36.3 C)  97.3 F (36.3 C)  TempSrc: Oral Oral  Oral  Resp: 18 18  18   Height:      Weight:      SpO2: 95% 99%  98%    Intake/Output Summary (Last 24 hours) at 04/19/12 1551 Last data filed at 04/19/12 0500  Gross per 24 hour  Intake    315 ml  Output      0 ml  Net    315 ml    Exam:   General:  Pt is alert, follows commands appropriately, not in acute distress, very soft spoken  Cardiovascular: Regular rate and rhythm, S1/S2, no murmurs, no rubs, no gallops  Respiratory: Clear to auscultation bilaterally but diminished breath sounds at bases, no wheezing, no crackles, no rhonchi  Abdomen: Soft, non tender, non distended, bowel sounds present, no guarding  Extremities: No edema, pulses DP and PT palpable bilaterally  Data Reviewed: Basic Metabolic Panel:  Lab [id number] 04/18/12 0605 04/16/12 0700 04/15/12 0500 04/14/12 0415 04/13/12 0340  NA 138 138 -- 138 137 137  K 4.4 4.5 -- 4.0 3.6 3.9  CL 101 101 -- 103 101 100  CO2 25 26 -- 25 24 23     GLUCOSE 130* 114* -- 133* 115* 86  BUN 17 14 -- 15 12 8   CREATININE 1.39* 1.26* -- 1.23* 1.41* 1.13*  CALCIUM 9.6 9.8 -- 9.0 9.0 9.5  MG -- -- 1.5 1.5 1.4* --  PHOS -- -- 2.8 3.7 3.6 --   CBC:  Lab 04/19/12 0555 04/18/12 0605 04/14/12 0415 04/13/12 0340  WBC 8.7 7.6 6.2 8.3  NEUTROABS -- -- -- --  HGB 11.1* 11.2* 10.3* 11.3*  HCT 34.2* 34.3* 31.8* 34.3*  MCV 87.9 87.5 87.1 87.5  PLT 304 283 315 362    Studies: No results found.  Scheduled Meds:   . amLODipine  10 mg Oral Daily  . cloNIDine  0.3 mg Oral TID  . free water  200 mL Per Tube QID  . hydrALAZINE  50 mg Oral TID  . labetalol  400 mg Oral TID  . levETIRAcetam  500 mg Oral BID  . oxybutynin  5 mg Oral BID   Continuous Infusions:   . 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  - as per Drs. 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 and also VP shunt placement  - No evidence of acute complications, on Keppra   Recent ventilator dependent respiratory failure:  - resolved  - pt maintaining oxygen saturations > 93% on RA   Acute on chronic diastolic CHF exacerbation, last 2 d ECHO [id number] with EF 55%  - grade II diastolic dysfunction  - clinically stable   Protein calorie malnutrition  - secondary to dysphagia in the setting of brain injury, progressive failure to thrive  - SLP evaluation  done but pt refuses to eat  History of CAD status post stenting in 2009  - Cont BP meds   History of chronic kidney disease: stage II  - Creatinine is at pt's baseline but slightly up from yesterday - BMP in AM   Anemia of chronic disease  - Hg and Hct are stable and at pt's baseline  - CBC in AM   Code Status: Full Family Communication: Pt at bedside Disposition Plan: SNF when bed available   Faye Ramsay, MD  [facility] Pager [phone]  If 7PM-7AM, please contact night-coverage [email or web address] Password [email or web address] 04/19/2012, 3:51 PM   LOS: 7 days

## 2012-04-19 NOTE — Progress Notes (Signed)
Slept +/- during the night. Foley patent. 2 sets of staples to right scalp. Abdominal incision with dermabond. Spent 15-20 minutes attempting to get patient to swallow scheduled, morning meds. Eventually spit them out in basin. Question add topical BP med since having difficulty getting patient to swallow PO meds. Coletta Memos

## 2012-04-19 NOTE — Progress Notes (Signed)
I contacted pt's daughter, Warrick Parisian , by phone. I discussed that her Mom is inconsistently eating, taking pills, and participating with therapy. Lacks initiation. Daughter moved here in April to care for her Mom for pt lacked local assistance from her sisters and and a daughter to care for her. [name] feels family will not assist in her care at home and she has 4 children  To care for also. Daughter likely to seek SNF. I will alert SW. Please call for questions. [phone]

## 2012-04-19 NOTE — Progress Notes (Signed)
Nutrition Follow-up Consult received for the initiation and management of TF. Per MD order nocturnal tube feedings started.    Intervention:    Pt to resume previous nocturnal TF Jevity 1.2 @ 105 ml x 14 hours which will provide 1764 kcal, 81 grams protein, 1192 ml H2O.  200 ml H2O every 4 hrs, will provide 1200 ml H2O,    Diet Order:  Regular  SLP eval continues to report that pt has a cognitive-based origin for both increased aspiration risk and poor po intake. Pt is noted to hold foods in her mouth despite several interventions to get pt to swallow. Diet advanced to Regular to allow for pt's favorite foods.  MD has addressed PEG placement with pt's family and this was refused.   Pt pulled panda out on Friday. Per notes panda was left out over the weekend to see how the patient would eat and take her meds.  Per documentation pt has consumed < 10% of her meals, did refuse some meals and meds as well.   Last bm: 04/15/12    Meds: Scheduled Meds:    . amLODipine  10 mg Oral Daily  . cloNIDine  0.3 mg Oral TID  . hydrALAZINE  50 mg Oral TID  . labetalol  400 mg Oral TID  . levETIRAcetam  500 mg Oral BID  . oxybutynin  5 mg Oral BID   Continuous Infusions:    . feeding supplement (JEVITY 1.2 CAL) 1,000 mL (04/15/12 2053)   PRN Meds:.acetaminophen, labetalol, morphine injection, ondansetron (ZOFRAN) IV  Labs:  CMP     Component Value Date/Time   NA 138 04/19/2012 0555   K 4.4 04/19/2012 0555   CL 101 04/19/2012 0555   CO2 25 04/19/2012 0555   GLUCOSE 130* 04/19/2012 0555   BUN 17 04/19/2012 0555   CREATININE 1.39* 04/19/2012 0555   CALCIUM 9.6 04/19/2012 0555   PROT 7.4 04/12/2012 0730   ALBUMIN 3.5 04/12/2012 0730   AST 14 04/12/2012 0730   ALT 10 04/12/2012 0730   ALKPHOS 99 04/12/2012 0730   BILITOT 0.3 04/12/2012 0730   GFRNONAA 43* 04/19/2012 0555   GFRAA 50* 04/19/2012 0555  CBG (last 3)  No results found for this basename: GLUCAP:3 in the last 72 hours Phosphorus  Date/Time  Value Range Status  04/16/2012  7:00 AM 2.8  2.3 - 4.6 mg/dL Final  04/15/2012  5:00 AM 3.7  2.3 - 4.6 mg/dL Final  04/14/2012  4:15 AM 3.6  2.3 - 4.6 mg/dL Final   Magnesium  Date/Time Value Range Status  04/16/2012  7:00 AM 1.5  1.5 - 2.5 mg/dL Final  04/15/2012  5:00 AM 1.5  1.5 - 2.5 mg/dL Final  04/14/2012  4:15 AM 1.4* 1.5 - 2.5 mg/dL Final    Intake/Output Summary (Last 24 hours) at 04/19/12 0956 Last data filed at 04/19/12 0500  Gross per 24 hour  Intake    315 ml  Output      0 ml  Net    315 ml    Weight Status:   155 lbs 6/26  Estimated needs:  Kcal: 1700 - 1900, Protein: 70 - 85 grams, Fluid: 1.8 - 2 liters daily  Nutrition Dx:  Inadequate oral intake r/t lethargy and poor attention AEB intake of <50% of meal and frequent refusals; ongoing  Goal: Pt to meet >/= 90% of their estimated nutrition needs; not met   Monitor:  PO intake, TF tolerance, weight   Maylon Peppers, RD, LDN,  CNSC [phone] pager

## 2012-04-19 NOTE — Progress Notes (Signed)
Occupational Therapy Treatment Patient Details Name: [name] MRN: [id number] DOB: June 07, 1961 Today's Date: 04/19/2012 Time: [id number] OT Time Calculation (min): 29 min  OT Assessment / Plan / Recommendation Comments on Treatment Session Pt with limited participation in session secondary to increased right side pain.  Only tolerated OOB for approximately 15 mins including toilting attempt, and needed encouragement to walk to the bathroom vs using the bedside toilet.  Recommend continued OT.  Will need SNF level rehab since 24 hour supervision is not available.      Follow Up Recommendations  Skilled nursing facility       Equipment Recommendations  Defer to next venue       Frequency Min 2X/week   Plan Discharge plan needs to be updated    Precautions / Restrictions Precautions Precautions: Fall Restrictions Weight Bearing Restrictions: No   Pertinent Vitals/Pain 10/10 right flank pain in sitting or standing, relieved with positioning in bed.    ADL  Toilet Transfer: Magazine features editor Method: Other (comment) (Ambulating with a RW.) Science writer: Comfort height toilet;Grab bars Toileting - Clothing Manipulation and Hygiene: Simulated;Min guard Where Assessed - Toileting Clothing Manipulation and Hygiene: Sit to stand from 3-in-1 or toilet Transfers/Ambulation Related to ADLs: Pt min guard assist for mobility with RW.  Needed mod instructional cueing for hand placement for sit to stand.   ADL Comments: Pt ambulated to the bathroom to sit on the toilet.  Unable to successfully void or have a bowel movement. During sitting pt lays her head across her arms which are propped on the walker. She reported increased pain in sitting as well as dizziness.  Unable to tolerate sitting more than a few minutes.  Began to have too much pain and requested to return to bed instead of attempting any grooming tasks.  Encouraged her to try and keep her head up in the bed to help  with decreasing the dizziness.       OT Goals ADL Goals Pt Will Transfer to Toilet: with supervision;with DME;Comfort height toilet ADL Goal: Toilet Transfer - Progress: Progressing toward goals ADL Goal: Additional Goal #2 - Progress: Met  Visit Information  Last OT Received On: 04/19/12 Assistance Needed: +1    Subjective Data  Subjective: "I had another anneurysm" Patient Stated Goal: Pt requested to go to the bathroom.      Cognition  Overall Cognitive Status: Impaired Area of Impairment: Memory Orientation Level: Time;Disoriented to Behavior During Session: Flat affect Current Attention Level: Sustained    Mobility Bed Mobility Bed Mobility: Supine to Sit Rolling Right: 5: Supervision;With rail Sit to Supine: 5: Supervision;HOB flat      Balance Static Standing Balance Static Standing - Balance Support: Right upper extremity supported;Left upper extremity supported Static Standing - Level of Assistance: 5: Stand by assistance  End of Session OT - End of Session Activity Tolerance: Patient limited by pain Patient left: in bed;with call bell/phone within reach;with bed alarm set     [place] OTR/L 04/19/2012, 3:28 PM Pager number [phone]

## 2012-04-19 NOTE — Progress Notes (Signed)
PT Cancellation Note  Treatment cancelled today due to patient's refusal to participate. Patient refusing to ambulate this morning. Educated on importance on OOB activity however patient continues to refuses.  [name] 04/19/2012, 9:29 AM

## 2012-04-19 NOTE — Progress Notes (Signed)
SLP Cancellation Note  Treatment cancelled today due to patient's refusal to participate in any PO trials, despite maximum encouragement and cues.   *Patient's issues with PO intake is directly related to her known cognitive impairment.  SLP services were treating patient's cognitive impairments while on CIR.  Would benefit from a cognitive evaluation order in order to continue these functional goals.  Patient does not have a true dysphagia rather cognitive etiology for her lack of PO intake.  Will f/u on 7/2 to continue with goals.  Wyline Copas, M.S.,CCC-SLP Pager [phone] 04/19/2012, 3:10 PM

## 2012-04-20 LAB — CBC
HCT: 34.3 % — ABNORMAL LOW (ref 36.0–46.0)
Platelets: 301 10*3/uL (ref 150–400)
RDW: 13.7 % (ref 11.5–15.5)
WBC: 8.6 10*3/uL (ref 4.0–10.5)

## 2012-04-20 LAB — BASIC METABOLIC PANEL
Calcium: 9.7 mg/dL (ref 8.4–10.5)
Chloride: 100 mEq/L (ref 96–112)
Creatinine, Ser: 1.38 mg/dL — ABNORMAL HIGH (ref 0.50–1.10)
GFR calc Af Amer: 51 mL/min — ABNORMAL LOW (ref >90)
GFR calc non Af Amer: 44 mL/min — ABNORMAL LOW (ref >90)

## 2012-04-20 MED ORDER — MORPHINE SULFATE 2 MG/ML IJ SOLN
2.0000 mg | INTRAMUSCULAR | Status: DC | PRN
  Administered 2012-04-21: 4 mg via INTRAVENOUS
  Administered 2012-04-21: 2 mg via INTRAVENOUS
  Filled 2012-04-20: qty 2
  Filled 2012-04-20: qty 1

## 2012-04-20 NOTE — Progress Notes (Signed)
Agree with discharge update.  Agency, [place] DPT [phone]

## 2012-04-20 NOTE — Progress Notes (Signed)
Speech Language Pathology Dysphagia Treatment Patient Details Name: [name] MRN: [id number] DOB: 01/01/1961 Today's Date: 04/20/2012 Time: [id number] SLP Time Calculation (min): 30 min  Assessment / Plan / Recommendation Clinical Impression  Treatment focused on skilled observation of diet tolerance with no evidence of a oral-pharyngeal dysphagia.  Following total assist level semantic cues/encouragement/reasoining, patient agreed to eat her french toast.  She consumed 5 small pieces, then became distracted by her own thoughts concerning why her family does not visit her in the hospital.  Appears patient is not recalling when they do come and then SLP was unable to redirect her and reason with her to return to PO intake.  At his point in time, there are no warranted dysphagia based goals as her impairments are cognitive in nature.  Request a cognitive evaluation to support cognitive based goals.  D/C dysphagia treatment.    Diet Recommendation  Continue with Current Diet: Regular;Thin liquid    SLP Plan Discharge SLP treatment due to (comment);All goals met   Pertinent Vitals/Pain n/a   Swallowing Goals  SLP Swallowing Goals Swallow Study Goal #1 - Progress: Met Swallow Study Goal #2 - Progress: Met  General Temperature Spikes Noted: No Respiratory Status: Room air Behavior/Cognition: Alert;Cooperative Oral Cavity - Dentition: Adequate natural dentition Patient Positioning: Upright in bed  Oral Cavity - Oral Hygiene Does patient have any of the following "at risk" factors?: None of the above   Dysphagia Treatment Treatment focused on: Skilled observation of diet tolerance;Patient/family/caregiver education Treatment Methods/Modalities: Skilled observation Patient observed directly with PO's: Yes Type of PO's observed: Regular;Thin liquids Feeding: Able to feed self;Other (Comment) (Verbal cues for sustained attention) Liquids provided via: Cup Type of cueing: Verbal Amount  of cueing: Moderate    Wyline Copas, M.S.,CCC-SLP Pager [phone] 04/20/2012, 10:02 AM

## 2012-04-20 NOTE — Progress Notes (Signed)
Around 1:30 with PT, patient complained of a "lump" feeling in her chest. She did not say the pain radiated, denied any SOB, pain anywhere else, jaw pain, crushing pain, just a tight lump feeling in her chest. VSS. MD paged, no orders at this time. Shortly after PT left, patient denied any pain. Will cont to monitor.  Minor, Dirk Dress

## 2012-04-20 NOTE — Progress Notes (Signed)
Recommend SNF at this time as well as alternate means of nutrition and medication management due to pt's cognition issues. Please call with any questions.[phone]

## 2012-04-20 NOTE — Progress Notes (Signed)
Physical Therapy Treatment Patient Details Name: [name] MRN: [id number] DOB: 08-Aug-1961 Today's Date: 04/20/2012 Time: [id number] PT Time Calculation (min): 12 min  PT Assessment / Plan / Recommendation Comments on Treatment Session  Patient requires increased encouragement and coaching to get OOB and mobilize with therapy. Patient stated that she had already been up and I was unable to confirm with staff. Once standing patient stated that she was having chest pain, RN was made aware. Patient seem to present a few minutes later without complaints and VSS. Earlene Plater if this may have been an attempt for patient to get out of working with therapy. Per progress notes, patient will not have 24/7 care at home and is not appropriate for CIR at this time. Will recommend STSNF for rehab.     Follow Up Recommendations  Skilled nursing facility    Barriers to Discharge        Equipment Recommendations  Defer to next venue    Recommendations for Other Services    Frequency Min 3X/week   Plan Discharge plan needs to be updated;Frequency remains appropriate    Precautions / Restrictions Precautions Precautions: Fall Precaution Comments: decreased initiation and safety awareness   Pertinent Vitals/Pain Patient with complaints of chest pain, VSS, RN aware. ? Attempt to get out of working with therapy.     Mobility  Bed Mobility Supine to Sit: 4: Min assist;With rails Sitting - Scoot to Edge of Bed: 4: Min guard Sit to Supine: 4: Min assist Details for Bed Mobility Assistance: A to elevate shoulders into sitting position and to lift LEs back up into bed. Requires increased time and cues for effeciency.  Transfers Sit to Stand: 4: Min guard;From bed;With upper extremity assist Stand to Sit: 4: Min guard;To bed;With upper extremity assist Details for Transfer Assistance: Cues for safe hand placement and not to pull up on RW    Exercises     PT Diagnosis:    PT Problem List:   PT Treatment  Interventions:     PT Goals Acute Rehab PT Goals PT Goal: Supine/Side to Sit - Progress: Progressing toward goal PT Goal: Sit at Edge Of Bed - Progress: Progressing toward goal PT Goal: Sit to Supine/Side - Progress: Progressing toward goal PT Goal: Sit to Stand - Progress: Progressing toward goal PT Goal: Stand to Sit - Progress: Progressing toward goal  Visit Information  Last PT Received On: 04/20/12 Assistance Needed: +1    Subjective Data  Subjective: "My chest hurts"   Cognition  Overall Cognitive Status: Impaired Area of Impairment: Memory Arousal/Alertness: Awake/alert Orientation Level: Time;Situation;Disoriented to Behavior During Session: Flat affect Current Attention Level: Sustained Cognition - Other Comments: Patient continues to have decreased initiation and decreased participation in therapies.     Balance     End of Session PT - End of Session Activity Tolerance: Other (comment) (Patient limited by lack of participation)   GP     Robin[place], Tonia Brooms 04/20/2012, 2:03 PM 04/20/2012 Jacqualyn Posey PTA [phone] pager [phone] office

## 2012-04-20 NOTE — Progress Notes (Signed)
Patient ID: [name], female   DOB: July 20, 1961, 51 y.o.   MRN: [id number]  [facility] PROGRESS NOTE  [name] [id number] DOB: 09-Mar-1961 DOA: 04/12/2012 PCP: William Hamburger, MD  Brief narrative:  Pt is 51 year old female who was admitted 04/13/2012 with uncontrolled hypertension and was found to have a subarachnoid hemorrhage. 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 The pt also underwent the placement of a VP shunt. Pt was recently discharge from the hospital to rehabilitation on 04/05/2012 and was getting feeds through a Panda tube.   Consultants:  PM&R - consult for inpatient rehabilitation, pt not candidate as not able to participate in therapy and no oral intake  Procedures:  none Antibiotics:  none  HPI/Subjective: No events overnight. Pt refuses to ambulate with me, she also refuses to eat. Denies chest pain or shortness of breath.   Objective: Filed Vitals:   04/20/12 0600 04/20/12 0906 04/20/12 1348 04/20/12 1355  BP: 143/86 141/79 121/83 121/83  Pulse: 70 75 80 80  Temp: 98.4 F (36.9 C) 98.5 F (36.9 C)    TempSrc: Oral Oral    Resp: 18 18 18    Height:      Weight: 71.8 kg (158 lb 4.6 oz)     SpO2: 100% 100%      Intake/Output Summary (Last 24 hours) at 04/20/12 1702 Last data filed at 04/20/12 1511  Gross per 24 hour  Intake    300 ml  Output    500 ml  Net   -200 ml    Exam:   General:  Pt is very tired appearing, follows commands appropriately but very soft spoken  Cardiovascular: Regular rate and rhythm, S1/S2, no murmurs, no rubs, no gallops  Respiratory: Clear to auscultation bilaterally but slightly decreased sounds at bases, no wheezing, no crackles, no rhonchi  Abdomen: Soft, non tender, non distended, bowel sounds present, no guarding  Extremities: No edema, pulses DP and PT palpable bilaterally  Data Reviewed: Basic Metabolic Panel:  Lab [id number] 04/19/12 0555 04/18/12 0605 04/16/12 0700  04/15/12 0500 04/14/12 0415  NA 135 138 138 -- 138 137  K 4.6 4.4 4.5 -- 4.0 3.6  CL 100 101 101 -- 103 101  CO2 24 25 26  -- 25 24  GLUCOSE 115* 130* 114* -- 133* 115*  BUN 17 17 14  -- 15 12  CREATININE 1.38* 1.39* 1.26* -- 1.23* 1.41*  CALCIUM 9.7 9.6 9.8 -- 9.0 9.0  MG -- -- -- 1.5 1.5 1.4*  PHOS -- -- -- 2.8 3.7 3.6    CBC:  Lab 04/20/12 0530 04/19/12 0555 04/18/12 0605 04/14/12 0415  WBC 8.6 8.7 7.6 6.2  NEUTROABS -- -- -- --  HGB 11.2* 11.1* 11.2* 10.3*  HCT 34.3* 34.2* 34.3* 31.8*  MCV 86.8 87.9 87.5 87.1  PLT 301 304 283 315    Studies: No results found.  Scheduled Meds:   . amLODipine  10 mg Oral Daily  . cloNIDine  0.3 mg Oral TID  . free water  200 mL Per Tube QID  . hydrALAZINE  50 mg Oral TID  . labetalol  400 mg Oral TID  . levETIRAcetam  500 mg Oral BID  . oxybutynin  5 mg Oral BID   Continuous Infusions:   . 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  - as per Drs. 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 and also VP shunt placement  - No evidence of acute complications, on Keppra   Recent ventilator dependent respiratory failure:  - resolved  - pt maintaining oxygen saturations > 93% on RA   Acute on chronic diastolic CHF exacerbation, last 2 d ECHO [id number] with EF 55%  - grade II diastolic dysfunction  -  clinically stable   Protein calorie malnutrition  - secondary to dysphagia in the setting of brain injury, progressive failure to thrive  - SLP evaluation done but pt refuses to eat   History of CAD status post stenting in 2009  - Cont BP meds   History of chronic kidney disease: stage II  - Creatinine is at pt's baseline  - BMP in AM   Anemia of chronic disease  - Hg and Hct are stable and at pt's baseline  - CBC in AM   Code Status: Full  Family Communication: Pt at bedside  Disposition Plan: SNF when bed available   Faye Ramsay, MD  [facility] Pager [phone]  If 7PM-7AM, please contact night-coverage [email or web address] Password [email or web address] 04/20/2012, 5:02 PM   LOS: 8 days

## 2012-04-21 LAB — BASIC METABOLIC PANEL
CO2: 23 mEq/L (ref 19–32)
Chloride: 99 mEq/L (ref 96–112)
Glucose, Bld: 119 mg/dL — ABNORMAL HIGH (ref 70–99)
Potassium: 4.3 mEq/L (ref 3.5–5.1)
Sodium: 135 mEq/L (ref 135–145)

## 2012-04-21 LAB — CBC
Hemoglobin: 11.1 g/dL — ABNORMAL LOW (ref 12.0–15.0)
Platelets: 294 10*3/uL (ref 150–400)
RBC: 3.87 MIL/uL (ref 3.87–5.11)
WBC: 8.1 10*3/uL (ref 4.0–10.5)

## 2012-04-21 NOTE — Progress Notes (Signed)
Occupational Therapy Treatment Patient Details Name: [name] MRN: [id number] DOB: 05/07/1961 Today's Date: 04/21/2012 Time: [id number] OT Time Calculation (min): 30 min  OT Assessment / Plan / Recommendation Comments on Treatment Session Pt with poor endurance for selfcare simulation and mobility.  Only toelrated sitting EOB for 10 mins with 2 rest breaks taken in sidelying.  Stood X 2 for approximately 1 minute before needing to rest.  Flat affect noted during session and pt doesn't appear to be motivated to push herself even though she says she really wants to participate.      Follow Up Recommendations  Skilled nursing facility;Supervision/Assistance - 24 hour       Equipment Recommendations  Defer to next venue       Frequency Min 2X/week   Plan Discharge plan remains appropriate    Precautions / Restrictions Precautions Precautions: Fall Restrictions Weight Bearing Restrictions: No   Pertinent Vitals/Pain BP sitting 91/77 and in standing 129/93.  Pt reports neck pain.  Repositioned in bed to assist with this.    ADL  Toilet Transfer: Simulated;Min guard;Other (comment) (Mod instructional cueing for hand placement for sit to stand) Toilet Transfer Method: Stand pivot Toilet Transfer Equipment: Bedside commode Toileting - Clothing Manipulation and Hygiene: Simulated;Min guard Where Assessed - Toileting Clothing Manipulation and Hygiene: Sit to stand from 3-in-1 or toilet Transfers/Ambulation Related to ADLs: Pt min guard assist for mobility in the room to the door and back using the RW. ADL Comments: Pt needs max encouragement to participate.  In sitting she complains of extreme fatigue and the need to lay down.  BP in sitting 91/77.  Attempted standing for BP and she was only able to maintain for about 1 min before needing to sit and rest.  Standing BP 129/93.  Only agreed to walk to the door of her room and back.  Would not attempt any gooming or toileting tasks during  session.  Returned to supine at end of session.  Tried to encourage her to keep her head up to help decrease dizziness and increase activity tolerance.      OT Goals ADL Goals ADL Goal: Toilet Transfer - Progress: Progressing toward goals  Visit Information  Last OT Received On: 04/21/12    Subjective Data  Subjective: "I feel so tired when I sit up" Patient Stated Goal: Pt wants to get better but and go home but no specific functional goals stated.      Cognition  Overall Cognitive Status: Impaired Area of Impairment: Attention;Memory Arousal/Alertness: Lethargic Orientation Level: Disoriented to;Time Behavior During Session: Lethargic Current Attention Level: Sustained    Mobility Bed Mobility Bed Mobility: Supine to Sit;Sit to Supine Supine to Sit: 5: Supervision;HOB elevated;With rails Sit to Supine: 5: Supervision;HOB flat;With rail      Balance Dynamic Standing Balance Dynamic Standing - Balance Support: Right upper extremity supported;Left upper extremity supported Dynamic Standing - Level of Assistance: 5: Stand by assistance (With use of RW for support.)  End of Session OT - End of Session Activity Tolerance: Patient limited by fatigue Patient left: in bed;with call bell/phone within reach;with bed alarm set     [place] OTR/L 04/21/2012, 4:09 PM Pager number [phone]

## 2012-04-21 NOTE — Care Management Note (Signed)
    Page 1 of 2   04/23/2012     3:08:38 PM   CARE MANAGEMENT NOTE 04/23/2012  Patient:  [name], [name]   Account Number:  [id number]  Date Initiated:  04/12/2012  Documentation initiated by:  [facility]  Subjective/Objective Assessment:   Admitted with hypertension from CIR.     Action/Plan:   Anticipated DC Date:  04/15/2012   Anticipated DC Plan:  IP REHAB FACILITY      DC Planning Services  CM consult      Choice offered to / List presented to:     DME arranged  3-N-1  [place]      DME agency  [place] arranged  HH-1 RN  [place].   Status of service:  In process, will continue to follow Medicare Important Message given?   (If response is "NO", the following Medicare IM given date fields will be blank) Date Medicare IM given:   Date Additional Medicare IM given:    Discharge Disposition:    Per UR Regulation:  Reviewed for med. necessity/level of care/duration of stay  If discussed at [place] of Stay Meetings, dates discussed:   04/21/2012    Comments:  04/23/12 Lars Pinks, RN, BSN 1506 PT HAS NOT BEEN EATING AND SHE IS HAVING ABD PAIN THAT WE ARE INVESTIGATING NOW.  PT WAS GIVEN STOOL SOFTNERS AND LAXATIVES AND WE ARE AWAITING RESULTS.  BP MEDS ARE ALSO BEING ADJUSTED.  PLAN IS STILL FOR HOME WITH HH.  04/21/12 Lars Pinks, RN, BSN [id number] PT HAS AN EXTENSIVE HX WITH BRAIN INJURIES AND SURGERIES. PT HAS REFUSED SNF.  I HAVE SPOKEN WITH HER DAUGHTER WHO STATES THAT HER FAMILY CAN TAKE HER HOME WITH 24 HR SUPERVISION.  DAUGHTER HAS APPROVED [facility].  I HAVE RECOMMENDED HH PT/OT/RN/SW/AIDE/ 3N1 AND RW.  I HAVE PAGED ATTENDING TO F/U ON NEEDS AND CONVERSATION WITH THE DAUGHTER.  RN STATES THAT PT IS MAJORLY DEPRESSED AND THAT PT STATES THAT FAMILY JUST WANTS HER MONEY.  WILL F/U ON NEEDS.  I HAVE INFORMED DAUGHTER THAT IF MEDICALLY STABLE DC MAY BE  AS EARLY AS TOMORROW.

## 2012-04-21 NOTE — Clinical Social Work Psychosocial (Signed)
     Clinical Social Work Department BRIEF PSYCHOSOCIAL ASSESSMENT 04/21/2012  Patient:  [name], [name]     Account Number:  [id number]     Admit date:  04/12/2012  Clinical Social Worker:  Lehman Prom  Date/Time:  04/21/2012 04:55 PM  Referred by:  Physician  Date Referred:  04/20/2012 Referred for  SNF Placement   Other Referral:   Interview type:  Family Other interview type:    PSYCHOSOCIAL DATA Living Status:  FAMILY Admitted from facility:   Level of care:   Primary support name:  [name] Townsend/[phone] Primary support relationship to patient:  CHILD, ADULT Degree of support available:   Supportive.    CURRENT CONCERNS Current Concerns  Post-Acute Placement   Other Concerns:    SOCIAL WORK ASSESSMENT / PLAN CSW met with pt to address consult. PT is recommending SNF. Pt shared that she would like to return home and declined SNF. Pt shared that this CSW could speak with her daughter. CSW contacted pt's daughter regarding discharge plan. Pt's daughter shared that she would like to take her home as her mother would like. Pt's daughter reported that she has family support to assist in the care. Per pt's daughter, pt will be having surgery in August. Pt's daughter reported that she will preparing the home for pt's return and inquired about home health services. CSW referred pt to [facility] for further discharge planning. At this time, CSW is signing off as pt and pt's daughter are declining SNF at this time. Please reconsult if a need arises prior to discharge.   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    PATIENTS/FAMILYS RESPONSE TO PLAN OF CARE: Pt was pleasant, however declined SNF. Pt's daughter declined SNF as well, and opted for home health services.

## 2012-04-21 NOTE — Progress Notes (Signed)
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  - as per Drs. 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  - No evidence of acute complications, on Keppra  Recent ventilator dependent respiratory failure:  - resolved  - pt maintaining oxygen saturations > 93% on RA  Acute on chronic diastolic CHF exacerbation, last 2 d ECHO [id number] with EF 55%  - grade II diastolic dysfunction  - clinically stable  Protein calorie malnutrition  - secondary to dysphagia in the setting of brain injury, progressive failure to thrive  - SLP evaluation done but pt refuses to eat  History of CAD status post stenting in 2009  - Cont BP meds  History of chronic kidney disease: stage II  - Creatinine is at pt's baseline  - BMP in AM  Anemia of chronic disease  - Hg and Hct are stable and at pt's baseline  - CBC in AM      Principal Problem:  *Hypertension, accelerated Active Problems:  CAD, NATIVE VESSEL  DIASTOLIC HEART FAILURE, CHRONIC  CHRONIC KIDNEY DISEASE UNSPECIFIED  SAH (subarachnoid hemorrhage)  Acute respiratory failure  Protein calorie malnutrition  Subdural hemorrhage  Code Status: Full  Family Communication: Pt at bedside  Disposition Plan: SNF when bed available    Adarsh Mundorf, MD  [facility] Pager [phone]  If 7PM-7AM, please contact night-coverage [email or web address] Password [email or web address] 04/21/2012, 9:29 AM   LOS: 9 days   Brief narrative:  Pt is 51 year old female who was admitted 04/13/2012 with uncontrolled hypertension and was found to have a subarachnoid hemorrhage. 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 The pt also underwent the placement of a VP shunt. Pt was recently discharge from the hospital to rehabilitation on 04/05/2012 and was getting feeds through a Panda tube.  Consultants:  PM&R - consult for inpatient rehabilitation, pt not candidate as not able to participate in therapy and no oral intake  Procedures:  none Antibiotics:  none   HPI/Subjective: C/o abd pain   Objective: Filed Vitals:   04/20/12 2200 04/21/12 0200 04/21/12 0500 04/21/12 0600  BP: 112/73 127/81  128/83  Pulse: 67 67  68  Temp: 98.3 F (36.8 C) 97.5 F (36.4 C)  97.9 F (36.6 C)  TempSrc: Oral Oral  Oral  Resp: 18 18  18   Height:      Weight:   72.3 kg (159 lb 6.3 oz)   SpO2: 98% 97%  96%    Intake/Output Summary (Last 24 hours) at 04/21/12 0929 Last data filed at 04/20/12 1511  Gross per 24 hour  Intake      0 ml  Output    500 ml  Net   -500 ml    Exam:   General:  Alert but confused   Cardiovascular: RRR  Respiratory:  CTAB  Abdomen: slight distended ,   Data Reviewed: Basic Metabolic Panel:  Lab [id number] 04/20/12 0530 04/19/12 0555 04/18/12 0605 04/16/12 0700 04/15/12 0500  NA 135 135 138 138 -- 138  K 4.3 4.6 4.4 4.5 -- 4.0  CL 99 100 101 101 -- 103  CO2 23 24 25 26  -- 25  GLUCOSE 119* 115* 130* 114* -- 133*  BUN 18 17 17 14  -- 15  CREATININE 1.50* 1.38* 1.39* 1.26* -- 1.23*  CALCIUM 9.8 9.7 9.6 9.8 -- 9.0  MG -- -- -- -- 1.5 1.5  PHOS -- -- -- -- 2.8 3.7   Liver Function Tests: No results found for this basename: AST:5,ALT:5,ALKPHOS:5,BILITOT:5,PROT:5,ALBUMIN:5 in the last 168 hours No results found for  this basename: LIPASE:5,AMYLASE:5 in the last 168 hours No results found for this basename: AMMONIA:5 in the last 168 hours CBC:  Lab 04/21/12 0616 04/20/12 0530 04/19/12 0555 04/18/12 0605  WBC 8.1 8.6 8.7 7.6  NEUTROABS -- -- -- --  HGB 11.1* 11.2* 11.1* 11.2*  HCT 33.6* 34.3* 34.2* 34.3*  MCV 86.8 86.8 87.9 87.5  PLT 294 301 304 283   Cardiac Enzymes: No results found for this basename: CKTOTAL:5,CKMB:5,CKMBINDEX:5,TROPONINI:5 in the last 168 hours BNP (last 3 results)  Basename 03/14/12 0400 03/07/12 0319  PROBNP 1667.0* 7892.0*   CBG: No results found for this basename: GLUCAP:5 in the last 168 hours  No results found for this or any previous visit (from the past 240 hour(s)).   Studies: No results found.  Scheduled Meds:   . amLODipine  10 mg Oral Daily  . cloNIDine  0.3 mg Oral TID  . free water  200 mL Per Tube QID  . hydrALAZINE  50 mg Oral TID  . labetalol  400 mg Oral TID  . levETIRAcetam  500 mg Oral BID  . oxybutynin  5 mg Oral BID   Continuous Infusions:   . feeding supplement (JEVITY 1.2 CAL) 1,000 mL (04/15/12 2053)

## 2012-04-22 DIAGNOSIS — K5900 Constipation, unspecified: Secondary | ICD-10-CM

## 2012-04-22 DIAGNOSIS — I609 Nontraumatic subarachnoid hemorrhage, unspecified: Secondary | ICD-10-CM

## 2012-04-22 LAB — BASIC METABOLIC PANEL
BUN: 18 mg/dL (ref 6–23)
Chloride: 101 mEq/L (ref 96–112)
Glucose, Bld: 104 mg/dL — ABNORMAL HIGH (ref 70–99)
Potassium: 4.3 mEq/L (ref 3.5–5.1)
Sodium: 137 mEq/L (ref 135–145)

## 2012-04-22 MED ORDER — HYDRALAZINE HCL 50 MG PO TABS
50.0000 mg | ORAL_TABLET | Freq: Three times a day (TID) | ORAL | Status: DC
  Administered 2012-04-22 – 2012-04-23 (×3): 50 mg via ORAL
  Filled 2012-04-22 (×6): qty 1

## 2012-04-22 MED ORDER — FLEET ENEMA 7-19 GM/118ML RE ENEM
1.0000 | ENEMA | Freq: Every day | RECTAL | Status: DC | PRN
  Administered 2012-04-23: 1 via RECTAL
  Filled 2012-04-22: qty 1

## 2012-04-22 MED ORDER — SENNOSIDES-DOCUSATE SODIUM 8.6-50 MG PO TABS
2.0000 | ORAL_TABLET | Freq: Two times a day (BID) | ORAL | Status: DC
  Administered 2012-04-22 – 2012-04-26 (×7): 2 via ORAL
  Filled 2012-04-22 (×7): qty 2

## 2012-04-22 MED ORDER — POLYETHYLENE GLYCOL 3350 17 G PO PACK
34.0000 g | PACK | Freq: Once | ORAL | Status: AC
  Administered 2012-04-22: 34 g via ORAL
  Filled 2012-04-22: qty 2

## 2012-04-22 MED ORDER — CLONIDINE HCL 0.3 MG PO TABS
0.3000 mg | ORAL_TABLET | Freq: Two times a day (BID) | ORAL | Status: DC
  Administered 2012-04-22: 0.3 mg via ORAL
  Filled 2012-04-22 (×3): qty 1

## 2012-04-22 MED ORDER — ACETAMINOPHEN 325 MG PO TABS
650.0000 mg | ORAL_TABLET | Freq: Once | ORAL | Status: DC
  Filled 2012-04-22 (×3): qty 2

## 2012-04-22 MED ORDER — BISACODYL 10 MG RE SUPP
10.0000 mg | Freq: Every day | RECTAL | Status: DC | PRN
  Administered 2012-04-25: 10 mg via RECTAL
  Filled 2012-04-22 (×2): qty 1

## 2012-04-22 NOTE — Progress Notes (Signed)
Physical Therapy Treatment Patient Details Name: [name] MRN: [id number] DOB: 1961/03/16 Today's Date: 04/22/2012 Time: [id number] PT Time Calculation (min): 28 min  PT Assessment / Plan / Recommendation Comments on Treatment Session  Patient with poor insight and feels she will be able to get around at home despite decreased drive and nausea when attempting mobility today.  Feel she will need more intensive rehab at SNF despite patient's and family's desire to take her home.      Follow Up Recommendations  Home health PT;Supervision/Assistance - 24 hour (due to patient/family plan for home)    Barriers to Discharge        Equipment Recommendations  Rolling walker with 5" wheels;Wheelchair (measurements)    Recommendations for Other Services    Frequency Min 3X/week   Plan Discharge plan needs to be updated    Precautions / Restrictions Precautions Precautions: Fall   Pertinent Vitals/Pain Denies pain currently    Mobility  Bed Mobility Rolling Right: 5: Supervision Right Sidelying to Sit: 5: Supervision;With rails;HOB flat Sitting - Scoot to Edge of Bed: 5: Supervision (increased time to initiate and complete task after cues) Sit to Supine: 5: Supervision (increased time and cues for positioning in bed) Transfers Sit to Stand: 4: Min assist;From bed;With upper extremity assist Stand to Sit: 4: Min assist;To bed;With upper extremity assist Details for Transfer Assistance: multiple cues/encouragement to participate in standing activites due to c/o nausea Ambulation/Gait Ambulation/Gait Assistance: 4: Min guard Ambulation Distance (Feet): 2 Feet Assistive device: Rolling walker Ambulation/Gait Assistance Details: side steps up to head of bed only after my insistance due to postioned near foot of bed.    Exercises General Exercises - Lower Extremity Straight Leg Raises: AROM;Both;5 reps Low Level/ICU Exercises Stabilized Bridging: AROM;Both;5 reps       PT  Goals Acute Rehab PT Goals Pt will go Supine/Side to Sit: with modified independence PT Goal: Supine/Side to Sit - Progress: Progressing toward goal Pt will Sit at [facility] of Bed: Independently;3-5 min PT Goal: Sit at Edge Of Bed - Progress: Progressing toward goal Pt will go Sit to Supine/Side: with modified independence PT Goal: Sit to Supine/Side - Progress: Progressing toward goal Pt will go Sit to Stand: with modified independence PT Goal: Sit to Stand - Progress: Progressing toward goal Pt will go Stand to Sit: with modified independence PT Goal: Stand to Sit - Progress: Progressing toward goal Pt will Ambulate: 51 - 150 feet;with supervision;with rolling walker PT Goal: Ambulate - Progress: Not progressing (due to nausea, decr initiation)  Visit Information  Last PT Received On: 04/22/12    Subjective Data  Subjective: Get nauseated every time I sit up   Cognition  Overall Cognitive Status: Impaired Area of Impairment: Attention;Memory;Safety/judgement;Awareness of deficits Arousal/Alertness: Awake/alert Behavior During Session: Lethargic Current Attention Level: Sustained Attention - Other Comments: needs frequent reminders of task at hand and switches without finishing prior task Memory Deficits: without cueing does not finish one task before changing to another Safety/Judgement: Decreased awareness of need for assistance Safety/Judgement - Other Comments: Feels once home she will be able to resume typical activities Awareness of Deficits: decreased awareness Cognition - Other Comments: decreased initiation/motivation despite encouagement, education on pathology    Balance     End of Session PT - End of Session Equipment Utilized During Treatment: Gait belt Activity Tolerance: Patient limited by fatigue Patient left: in bed;with call bell/phone within reach;with bed alarm set   GP     [facility] 04/22/2012, 1:23 PM

## 2012-04-22 NOTE — Progress Notes (Signed)
[facility] PROGRESS NOTE  [name] [id number] DOB: 10-25-1960 DOA: 04/12/2012 PCP: William Hamburger, MD  Assessment/Plan Patient refusing to eat - ? 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  - as per Drs. 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  - No evidence of acute complications, on Keppra  Recent ventilator dependent respiratory failure:  - resolved  - pt maintaining oxygen saturations > 93% on RA  Acute on chronic diastolic CHF exacerbation, last 2 d ECHO [id number] with EF 55%  - grade II diastolic dysfunction  - clinically stable  Protein calorie malnutrition  - secondary to dysphagia in the setting of brain injury, progressive failure to thrive  - SLP evaluation done but pt refuses to eat  History of CAD status post stenting in 2009  - Cont BP meds  History of chronic kidney disease: stage II  - Creatinine is at pt's baseline     Principal Problem:  *Hypertension, accelerated Active Problems:  CAD, NATIVE VESSEL  DIASTOLIC HEART FAILURE, CHRONIC  CHRONIC KIDNEY DISEASE UNSPECIFIED  SAH (subarachnoid hemorrhage)  Protein calorie malnutrition  Subdural hemorrhage  Constipation  Code Status: Full  Family CommunicationWarrick Parisian Daughter [phone] Disposition Plan: Home with home health    Kc Summerson, MD  [facility] Pager [phone]  If 7PM-7AM, please contact night-coverage [email or web address] Password [email or web address] 04/22/2012, 10:33 AM   LOS: 10 days   Brief narrative:  Pt is 51 year old female who was admitted 04/13/2012 with uncontrolled hypertension and was found to have a subarachnoid hemorrhage. 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 The pt also underwent the placement of a VP shunt. Pt was recently discharge from the hospital to rehabilitation on 04/05/2012 and was getting feeds through a Panda tube.  Consultants:  PM&R - consult for inpatient rehabilitation, pt not candidate as not able to participate in therapy and no oral intake  Procedures:  none Antibiotics:  none   HPI/Subjective: C/o abd distension    Objective: Filed Vitals:   04/21/12 2308 04/22/12 0233 04/22/12 0538 04/22/12 1002  BP: 122/80 102/62 133/84 136/83  Pulse: 84 90 78 68  Temp: 98.3 F (36.8 C) 98.5 F (36.9 C) 98.4 F (36.9 C) 98.2 F (36.8 C)  TempSrc: Oral Oral Oral Oral  Resp: 18 18 18 18   Height:      Weight:   72 kg (158 lb 11.7 oz)   SpO2: 99% 99% 100% 96%    Intake/Output Summary (Last 24 hours) at 04/22/12 1033 Last data filed at 04/22/12 0900  Gross per 24 hour  Intake      0 ml  Output  0 ml  Net      0 ml    Exam:   General:  Alert but confused   Cardiovascular: RRR  Respiratory: CTAB  Abdomen: slight distended , quiet   Data Reviewed: Basic Metabolic Panel:  Lab [id number] 0500 04/21/12 0616 04/20/12 0530 04/19/12 0555 04/18/12 0605 04/16/12 0700  NA 137 135 135 138 138 --  K 4.3 4.3 4.6 4.4 4.5 --  CL 101 99 100 101 101 --  CO2 24 23 24 25 26  --  GLUCOSE 104* 119* 115* 130* 114* --  BUN 18 18 17 17 14  --  CREATININE 1.48* 1.50* 1.38* 1.39* 1.26* --    CALCIUM 9.7 9.8 9.7 9.6 9.8 --  MG -- -- -- -- -- 1.5  PHOS -- -- -- -- -- 2.8   Liver Function Tests: No results found for this basename: AST:5,ALT:5,ALKPHOS:5,BILITOT:5,PROT:5,ALBUMIN:5 in the last 168 hours No results found for this basename: LIPASE:5,AMYLASE:5 in the last 168 hours No results found for this basename: AMMONIA:5 in the last 168 hours CBC:  Lab 04/21/12 0616 04/20/12 0530 04/19/12 0555 04/18/12 0605  WBC 8.1 8.6 8.7 7.6  NEUTROABS -- -- -- --  HGB 11.1* 11.2* 11.1* 11.2*  HCT 33.6* 34.3* 34.2* 34.3*  MCV 86.8 86.8 87.9 87.5  PLT 294 301 304 283   Cardiac Enzymes: No results found for this basename: CKTOTAL:5,CKMB:5,CKMBINDEX:5,TROPONINI:5 in the last 168 hours BNP (last 3 results)  Basename 03/14/12 0400 03/07/12 0319  PROBNP 1667.0* 7892.0*   CBG: No results found for this basename: GLUCAP:5 in the last 168 hours  No results found for this or any previous visit (from the past 240 hour(s)).   Studies: No results found.  Scheduled Meds:    . acetaminophen  650 mg Oral Once  . amLODipine  10 mg Oral Daily  . cloNIDine  0.3 mg Oral BID  . hydrALAZINE  50 mg Oral TID  . hydrALAZINE  50 mg Oral Q8H  . labetalol  400 mg Oral TID  . levETIRAcetam  500 mg Oral BID  . oxybutynin  5 mg Oral BID  . polyethylene glycol  34 g Oral Once  . senna-docusate  2 tablet Oral BID  . DISCONTD: cloNIDine  0.3 mg Oral TID  . DISCONTD: free water  200 mL Per Tube QID   Continuous Infusions:    . DISCONTD: feeding supplement (JEVITY 1.2 CAL) 1,000 mL (04/15/12 2053)

## 2012-04-23 ENCOUNTER — Inpatient Hospital Stay (HOSPITAL_COMMUNITY): Payer: No Typology Code available for payment source

## 2012-04-23 DIAGNOSIS — I10 Essential (primary) hypertension: Secondary | ICD-10-CM

## 2012-04-23 MED ORDER — SODIUM CHLORIDE 0.9 % IV SOLN
INTRAVENOUS | Status: DC
  Administered 2012-04-23 (×2): via INTRAVENOUS

## 2012-04-23 MED ORDER — HYDRALAZINE HCL 50 MG PO TABS
50.0000 mg | ORAL_TABLET | Freq: Four times a day (QID) | ORAL | Status: DC
  Administered 2012-04-23 – 2012-04-27 (×9): 50 mg via ORAL
  Filled 2012-04-23 (×20): qty 1

## 2012-04-23 MED ORDER — POLYETHYLENE GLYCOL 3350 17 G PO PACK
68.0000 g | PACK | Freq: Every day | ORAL | Status: DC
  Administered 2012-04-23 – 2012-04-26 (×4): 68 g via ORAL
  Filled 2012-04-23 (×5): qty 4

## 2012-04-23 MED ORDER — CLONIDINE HCL 0.2 MG PO TABS
0.2000 mg | ORAL_TABLET | Freq: Two times a day (BID) | ORAL | Status: DC
  Administered 2012-04-23: 0.2 mg via ORAL
  Filled 2012-04-23 (×3): qty 1

## 2012-04-23 MED ORDER — MAGNESIUM CITRATE PO SOLN
1.0000 | Freq: Once | ORAL | Status: AC
  Administered 2012-04-23: 1 via ORAL
  Filled 2012-04-23: qty 296

## 2012-04-23 MED ORDER — POLYETHYLENE GLYCOL 3350 17 GM/SCOOP PO POWD
1.0000 | Freq: Once | ORAL | Status: AC
  Administered 2012-04-23: 1 via ORAL
  Filled 2012-04-23: qty 255

## 2012-04-23 NOTE — Progress Notes (Signed)
Patient ID: [name], female   DOB: Mar 08, 1961, 51 y.o.   MRN: [id number] . [facility] PROGRESS NOTE  [name] [id number] DOB: 1961-02-26 DOA: 04/12/2012 PCP: Richardson Dopp, PA  Assessment/Plan Patient refusing to eat - ? cause    Left sided abdominal pain.   She may have severe constipation from clonidine - will prescribe mag citrate and rectal disimpaction.  Decreased clonidine (0.2 bid) and increased hydralazine to QID.  Potassium in a good range.  Giving enema, laxatives, stool softeners.    Accelerated/Malignant hypertension:  - BP better controlled, pt is off nitro gtt  - will continue oral medications for now and will readjust the regimen as indicated.  Decreased clonidine (0.2 bid) and increased hydralazine to QID.  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  Appreciate nutrition consult and recs. - SLP eval reveals cognitive etiology to her swallowing dysfunction but patient tolerates regular diet when she eats.  - as per Drs. 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  - No evidence of acute complications, on Keppra   Recent ventilator dependent respiratory failure:  - resolved  - pt maintaining oxygen saturations > 93% on RA   Acute on chronic diastolic CHF exacerbation, last 2 d ECHO [id number] with EF 55%  - grade II diastolic dysfunction  - clinically stable   Protein calorie malnutrition  - secondary to dysphagia in the setting of brain injury, progressive failure to thrive  - SLP evaluation done but pt refuses to eat   History of CAD status post stenting in 2009  - Cont BP meds   History of chronic kidney disease: stage II  - Creatinine is  elevated (baseline is 1.07).  Not on nephrotoxic meds.  May be dehydrated from lack of PO intake.  Will give IVF 50 ml/hour x 24 hours, and recheck bmet in am.   Code Status: Full  Family Communication: Warrick Parisian Daughter [phone] Disposition Plan: Home with home health    Melton Alar, MD  [facility] Pager [phone]  If 7PM-7AM, please contact night-coverage [email or web address] Password [email or web address] 04/23/2012, 10:57 AM   LOS: 11 days   Brief narrative:  Pt is 51 year old female who was admitted 04/13/2012 with uncontrolled hypertension and was found to have a subarachnoid hemorrhage. 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 The pt also underwent the placement of a VP shunt. Pt was recently discharge from the hospital to rehabilitation on 04/05/2012 and was getting feeds through a Panda tube.  Consultants:  PM&R - consult for inpatient rehabilitation, pt not candidate as not able to participate in therapy and no oral intake  Procedures:  none Antibiotics:  none   HPI/Subjective: C/o abd distension    Objective: Filed Vitals:   04/22/12 1849 04/22/12 2123 04/23/12 0500 04/23/12 0540  BP: 135/83 121/78  125/85  Pulse: 70 71  71  Temp: 97.4 F (36.3 C) 97.9 F (36.6 C)  97.4 F (36.3 C)  TempSrc: Oral Oral  Oral  Resp: 18 18  18   Height:      Weight:  72.4 kg (159 lb 9.8 oz) 72.6 kg (160 lb 0.9 oz)  SpO2: 100% 100%  99%    Intake/Output Summary (Last 24 hours) at 04/23/12 1057 Last data filed at 04/23/12 0900  Gross per 24 hour  Intake    180 ml  Output      0 ml  Net    180 ml    Exam:   General:  Alert, Quiet.  Cardiovascular: RRR  Respiratory: CTAB  Abdomen: slight distended , + BS, Tender to palpation in LLQ  Ext.  No lower ext edema.  Data Reviewed: Basic Metabolic Panel:  Lab [id number] 0500 04/21/12 0616 04/20/12 0530 04/19/12 0555 04/18/12 0605  NA 137 135 135 138 138  K 4.3 4.3 4.6 4.4 4.5  CL  101 99 100 101 101  CO2 24 23 24 25 26   GLUCOSE 104* 119* 115* 130* 114*  BUN 18 18 17 17 14   CREATININE 1.48* 1.50* 1.38* 1.39* 1.26*  CALCIUM 9.7 9.8 9.7 9.6 9.8  MG -- -- -- -- --  PHOS -- -- -- -- --   CBC:  Lab 04/21/12 0616 04/20/12 0530 04/19/12 0555 04/18/12 0605  WBC 8.1 8.6 8.7 7.6  NEUTROABS -- -- -- --  HGB 11.1* 11.2* 11.1* 11.2*  HCT 33.6* 34.3* 34.2* 34.3*  MCV 86.8 86.8 87.9 87.5  PLT 294 301 304 283   BNP (last 3 results)  Basename 03/14/12 0400 03/07/12 0319  PROBNP 1667.0* 7892.0*     Studies: No results found.  Scheduled Meds:    . acetaminophen  650 mg Oral Once  . amLODipine  10 mg Oral Daily  . cloNIDine  0.3 mg Oral BID  . hydrALAZINE  50 mg Oral TID  . hydrALAZINE  50 mg Oral Q8H  . labetalol  400 mg Oral TID  . levETIRAcetam  500 mg Oral BID  . oxybutynin  5 mg Oral BID  . polyethylene glycol  68 g Oral Daily  . senna-docusate  2 tablet Oral BID   Continuous Infusions:

## 2012-04-23 NOTE — Progress Notes (Signed)
[name] had an enema this morning, but no BM yet. Trying to make her convinced to take take Miralax Bowel Prep  but she is keep on refusing. Tried it in water, coc, ice cream and juice but she refuse everything after one or two sips. Will keep trying.

## 2012-04-23 NOTE — Progress Notes (Signed)
Nutrition Follow-up Consult received for the initiation and management of TF. Per MD order nocturnal tube feedings started.    Intervention:   Magic cup TID between meals, each supplement provides 290 kcal and 9 grams of protein.  Diet Order:  Regular  SLP eval continues to report that pt has a cognitive-based origin for both increased aspiration risk and poor po intake. Pt is noted to hold foods in her mouth despite several interventions to get pt to swallow. Diet advanced to Regular to allow for pt's favorite foods.  MD has addressed PEG placement with pt's family and this was refused.   Pt pulled panda out and refused to have it replaced. Pt/family refuses PEG. Per RN family hopeful that pt will eat once she is at home. Per RN family has been educated that she will likely not eat due to her brain injury.  PO intake has been <25% with refusal of most meals.  Pt is constipated, meds given to assist. Possible d/c after BM per RN.   Last bm: 04/15/12-pt refusing meds   Meds: Scheduled Meds:    . acetaminophen  650 mg Oral Once  . amLODipine  10 mg Oral Daily  . cloNIDine  0.3 mg Oral BID  . hydrALAZINE  50 mg Oral TID  . hydrALAZINE  50 mg Oral Q8H  . labetalol  400 mg Oral TID  . levETIRAcetam  500 mg Oral BID  . oxybutynin  5 mg Oral BID  . polyethylene glycol  34 g Oral Once  . senna-docusate  2 tablet Oral BID  . DISCONTD: cloNIDine  0.3 mg Oral TID   Continuous Infusions:   PRN Meds:.acetaminophen, bisacodyl, labetalol, ondansetron (ZOFRAN) IV, sodium phosphate  Labs:  CMP     Component Value Date/Time   NA 137 04/22/2012 0500   K 4.3 04/22/2012 0500   CL 101 04/22/2012 0500   CO2 24 04/22/2012 0500   GLUCOSE 104* 04/22/2012 0500   BUN 18 04/22/2012 0500   CREATININE 1.48* 04/22/2012 0500   CALCIUM 9.7 04/22/2012 0500   PROT 7.4 04/12/2012 0730   ALBUMIN 3.5 04/12/2012 0730   AST 14 04/12/2012 0730   ALT 10 04/12/2012 0730   ALKPHOS 99 04/12/2012 0730   BILITOT 0.3 04/12/2012  0730   GFRNONAA 40* 04/22/2012 0500   GFRAA 47* 04/22/2012 0500  CBG (last 3)  No results found for this basename: GLUCAP:3 in the last 72 hours Phosphorus  Date/Time Value Range Status  04/16/2012  7:00 AM 2.8  2.3 - 4.6 mg/dL Final  04/15/2012  5:00 AM 3.7  2.3 - 4.6 mg/dL Final  04/14/2012  4:15 AM 3.6  2.3 - 4.6 mg/dL Final   Magnesium  Date/Time Value Range Status  04/16/2012  7:00 AM 1.5  1.5 - 2.5 mg/dL Final  04/15/2012  5:00 AM 1.5  1.5 - 2.5 mg/dL Final  04/14/2012  4:15 AM 1.4* 1.5 - 2.5 mg/dL Final    Intake/Output Summary (Last 24 hours) at 04/23/12 1018 Last data filed at 04/23/12 0900  Gross per 24 hour  Intake    180 ml  Output      0 ml  Net    180 ml    Weight Status:   160 lbs 7/5  Estimated needs:  Kcal: 1700 - 1900, Protein: 70 - 85 grams, Fluid: 1.8 - 2 liters daily  Nutrition Dx:  Inadequate oral intake r/t lethargy and poor attention AEB intake of <50% of meal and frequent refusals; ongoing  Goal: Pt to meet >/= 90% of their estimated nutrition needs; not met   Monitor:  PO intake, plan of care, weight   Maylon Peppers, [place], [place], [place] pager

## 2012-04-23 NOTE — Progress Notes (Signed)
I have personally seen and examined [name] I have discussed her situation at length during rounds with [name] physician assistant I agree with assessment and plan and physical exam as laid out in [facility] note [name]

## 2012-04-24 LAB — BASIC METABOLIC PANEL
BUN: 16 mg/dL (ref 6–23)
CO2: 22 mEq/L (ref 19–32)
Chloride: 103 mEq/L (ref 96–112)
Creatinine, Ser: 1.33 mg/dL — ABNORMAL HIGH (ref 0.50–1.10)
Potassium: 3.8 mEq/L (ref 3.5–5.1)

## 2012-04-24 MED ORDER — CLONIDINE HCL 0.1 MG PO TABS
0.1000 mg | ORAL_TABLET | Freq: Two times a day (BID) | ORAL | Status: DC
  Administered 2012-04-24: 0.1 mg via ORAL
  Filled 2012-04-24 (×3): qty 1

## 2012-04-24 MED ORDER — LEVETIRACETAM 500 MG PO TABS
500.0000 mg | ORAL_TABLET | Freq: Two times a day (BID) | ORAL | Status: DC
  Administered 2012-04-24 – 2012-04-26 (×4): 500 mg via ORAL
  Filled 2012-04-24 (×8): qty 1

## 2012-04-24 MED ORDER — POLYETHYLENE GLYCOL 3350 17 G PO PACK
68.0000 g | PACK | Freq: Every day | ORAL | Status: DC | PRN
  Filled 2012-04-24: qty 4

## 2012-04-24 MED ORDER — LISINOPRIL 10 MG PO TABS
10.0000 mg | ORAL_TABLET | Freq: Every day | ORAL | Status: DC
  Administered 2012-04-24 – 2012-04-26 (×3): 10 mg via ORAL
  Filled 2012-04-24 (×4): qty 1

## 2012-04-24 MED ORDER — MAGNESIUM CITRATE PO SOLN
1.0000 | Freq: Once | ORAL | Status: DC
  Filled 2012-04-24: qty 296

## 2012-04-24 MED ORDER — SORBITOL 70 % SOLN
30.0000 mL | Freq: Every day | Status: DC | PRN
  Administered 2012-04-24 – 2012-04-25 (×2): 30 mL via ORAL
  Filled 2012-04-24 (×2): qty 30

## 2012-04-24 MED ORDER — ONDANSETRON HCL 4 MG PO TABS
4.0000 mg | ORAL_TABLET | ORAL | Status: DC | PRN
  Administered 2012-04-24: 4 mg via ORAL
  Filled 2012-04-24: qty 1

## 2012-04-24 NOTE — Progress Notes (Signed)
Pt c/o nausea and vomiting, provider on call notified and Zofran PO ordered. Medication administered, pt resting. Will continue to monitor.

## 2012-04-24 NOTE — Progress Notes (Signed)
[facility] PROGRESS NOTE  Salli Dettorre [id number] DOB: 11-10-1960 DOA: 04/12/2012 PCP: Richardson Dopp, PA  Assessment/Plan Patient refusing to eat - ? cause  She may have severe constipation from clonidine - will prescribe laxatives and try to help patient have a Bm prior to Dc . AXR confirms fecal impaction in the right colon . Patient was initially on clonidine 03. TID and we are titrating down clonidine and initiating other meds.   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  - as per Drs. 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  - No evidence of acute complications, on Keppra  Recent ventilator dependent respiratory failure:  - resolved  - pt maintaining oxygen saturations > 93% on RA  Acute on chronic diastolic CHF exacerbation, last 2 d ECHO [id number] with EF 55%  - grade II diastolic dysfunction  - clinically stable  Protein calorie malnutrition  - secondary to dysphagia in the setting of brain injury, progressive failure to thrive  - SLP evaluation done but pt refuses to eat  History of CAD status post stenting in 2009  - asymptomatic  History of chronic kidney disease: stage II  - Creatinine is at pt's baseline     Principal Problem:  *Hypertension, accelerated Active Problems:  CAD, NATIVE VESSEL  DIASTOLIC HEART FAILURE, CHRONIC  CHRONIC KIDNEY DISEASE UNSPECIFIED  SAH  (subarachnoid hemorrhage)  Protein calorie malnutrition  Subdural hemorrhage  Constipation  Code Status: Full  Family CommunicationWarrick Parisian Daughter [phone] Disposition Plan: Home with home health    Suan Pyeatt, MD  [facility] Pager [phone]  If 7PM-7AM, please contact night-coverage [email or web address] Password [email or web address] 04/24/2012, 4:22 PM   LOS: 12 days   Brief narrative:  Pt is 51 year old female who was admitted 04/13/2012 with uncontrolled hypertension and was found to have a subarachnoid hemorrhage. 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 The pt also underwent the placement of a VP shunt. Pt was recently discharge from the hospital to rehabilitation on 04/05/2012 and was getting feeds through a Panda tube.  Consultants:  PM&R - consult for inpatient rehabilitation, pt not candidate as not able to participate in therapy and no oral intake  Procedures:  none Antibiotics:  none   HPI/Subjective: C/o abd distension    Objective: Filed Vitals:   04/24/12 0457 04/24/12 0500 04/24/12 1000 04/24/12 1400  BP: 139/81  118/70 133/81  Pulse: 63  67 74  Temp: 97.7 F (36.5 C)  98 F (36.7 C) 97.9 F (36.6 C)  TempSrc: Oral  Oral Oral  Resp: 18  18 18   Height:      Weight:  66.7 kg (147 lb 0.8 oz)    SpO2: 94%  96% 100%   No intake or output data in the 24 hours ending 04/24/12 1622  Exam:   General:  Alert but confused   Cardiovascular: RRR  Respiratory: CTAB  Abdomen: slight distended , quiet   Data Reviewed: Basic Metabolic Panel:  Lab [id number] 0630 04/22/12 0500 04/21/12 0616 04/20/12 0530 04/19/12 0555  NA 138 137 135 135 138  K 3.8 4.3 4.3 4.6 4.4  CL 103 101 99 100 101  CO2 22 24 23 24 25   GLUCOSE 101* 104* 119* 115* 130*  BUN 16 18 18 17 17   CREATININE 1.33* 1.48* 1.50* 1.38* 1.39*  CALCIUM 9.7 9.7 9.8 9.7 9.6  MG -- -- -- -- --  PHOS -- -- -- -- --   Liver Function Tests: No results found for  this basename: AST:5,ALT:5,ALKPHOS:5,BILITOT:5,PROT:5,ALBUMIN:5 in the last 168 hours No results found for this basename: LIPASE:5,AMYLASE:5 in the last 168 hours No results found for this basename: AMMONIA:5 in the last 168 hours CBC:  Lab 04/21/12 0616 04/20/12 0530 04/19/12 0555 04/18/12 0605  WBC 8.1 8.6 8.7 7.6  NEUTROABS -- -- -- --  HGB 11.1* 11.2* 11.1* 11.2*  HCT 33.6* 34.3* 34.2* 34.3*  MCV 86.8 86.8 87.9 87.5  PLT 294 301 304 283   Cardiac Enzymes: No results found for this basename: CKTOTAL:5,CKMB:5,CKMBINDEX:5,TROPONINI:5 in the last 168 hours BNP (last 3 results)  Basename 03/14/12 0400 03/07/12 0319  PROBNP 1667.0* 7892.0*   CBG: No results found for this basename: GLUCAP:5 in the last 168 hours  No results found for this or any previous visit (from the past 240 hour(s)).   Studies: Dg Abd Portable 1v  04/23/2012  *RADIOLOGY REPORT*  Clinical Data: Abdominal pain and distention.  Nausea.  PORTABLE ABDOMEN - 1 VIEW 04/23/2012 1848 hours:  Comparison: Portable abdomen x-rays 04/13/2012 dating back to 04/02/2012.  Findings: Bowel gas pattern nonobstructive.  Gas within normal caliber small bowel diffusely.  Very large amount of stool in the cecum, ascending colon, and proximal transverse colon, unchanged. Gas within nondistended descending colon, sigmoid colon, and rectum.  No suggestion of free air on the supine image.  Right- sided ventriculoperitoneal shunt catheter tubing with its tip in the right side of the upper pelvis.  Regional skeleton unremarkable.  No abnormal calcifications.  IMPRESSION: No acute abdominal abnormality.  Very large stool burden in the cecum, ascending colon, and proximal transverse colon, unchanged from prior examinations.  Original Report Authenticated By: Deniece Portela, M.D.    Scheduled Meds:    . acetaminophen  650 mg Oral Once  . amLODipine  10 mg Oral Daily  . cloNIDine  0.1 mg Oral BID  . hydrALAZINE  50 mg Oral Q6H  . labetalol   400 mg Oral TID  . levETIRAcetam  500 mg Oral BID  . lisinopril  10 mg Oral Daily  . magnesium citrate  1 Bottle Oral Once  . polyethylene glycol  68 g Oral Daily  . polyethylene glycol powder  1 Container Oral Once  . senna-docusate  2 tablet Oral BID  . DISCONTD: cloNIDine  0.2 mg Oral BID  . DISCONTD: levETIRAcetam  500 mg Oral BID  . DISCONTD: oxybutynin  5 mg Oral BID   Continuous Infusions:    . DISCONTD: sodium chloride 50 mL/hr at 04/23/12 2242

## 2012-04-24 NOTE — Progress Notes (Signed)
Pt still c/o N/V, however she refuses to take any of her oral laxatives. She sts "You just keep bringing me pills but I feel worse than ever". This RN tried to explain to pt importance of having a bowel movement but pt is not cooperating. She also refused to ambulate. NP Rogue Bussing contacted and is advising to administer Dulcolax supp. Medication administered, will continue to monitor.

## 2012-04-25 ENCOUNTER — Inpatient Hospital Stay (HOSPITAL_COMMUNITY): Payer: No Typology Code available for payment source

## 2012-04-25 LAB — BASIC METABOLIC PANEL
BUN: 13 mg/dL (ref 6–23)
Creatinine, Ser: 1.35 mg/dL — ABNORMAL HIGH (ref 0.50–1.10)
GFR calc non Af Amer: 45 mL/min — ABNORMAL LOW (ref >90)
Glucose, Bld: 96 mg/dL (ref 70–99)
Potassium: 3.7 mEq/L (ref 3.5–5.1)

## 2012-04-25 MED ORDER — METOCLOPRAMIDE HCL 10 MG PO TABS
10.0000 mg | ORAL_TABLET | Freq: Three times a day (TID) | ORAL | Status: DC
  Administered 2012-04-25 – 2012-04-26 (×3): 10 mg via ORAL
  Filled 2012-04-25 (×9): qty 1

## 2012-04-25 MED ORDER — METOCLOPRAMIDE HCL 5 MG/ML IJ SOLN
10.0000 mg | Freq: Four times a day (QID) | INTRAMUSCULAR | Status: DC
  Filled 2012-04-25 (×5): qty 2

## 2012-04-25 MED ORDER — METOCLOPRAMIDE HCL 5 MG/ML IJ SOLN
10.0000 mg | Freq: Four times a day (QID) | INTRAMUSCULAR | Status: DC | PRN
  Administered 2012-04-25: 10 mg via INTRAVENOUS
  Filled 2012-04-25: qty 2

## 2012-04-25 MED ORDER — CLONIDINE HCL 0.1 MG PO TABS
0.1000 mg | ORAL_TABLET | Freq: Four times a day (QID) | ORAL | Status: DC | PRN
  Filled 2012-04-25: qty 1

## 2012-04-25 MED ORDER — HYDROXYZINE HCL 50 MG/ML IM SOLN
25.0000 mg | Freq: Four times a day (QID) | INTRAMUSCULAR | Status: DC | PRN
  Administered 2012-04-25: 25 mg via INTRAMUSCULAR
  Filled 2012-04-25: qty 0.5

## 2012-04-25 MED ORDER — PANTOPRAZOLE SODIUM 40 MG IV SOLR
40.0000 mg | INTRAVENOUS | Status: DC
  Administered 2012-04-25: 40 mg via INTRAVENOUS
  Filled 2012-04-25 (×2): qty 40

## 2012-04-25 MED ORDER — LORAZEPAM 2 MG/ML IJ SOLN
0.5000 mg | Freq: Once | INTRAMUSCULAR | Status: AC
  Administered 2012-04-25: 0.5 mg via INTRAVENOUS
  Filled 2012-04-25: qty 1

## 2012-04-25 MED ORDER — SODIUM CHLORIDE 0.9 % IV SOLN
INTRAVENOUS | Status: DC
  Administered 2012-04-25: 16:00:00 via INTRAVENOUS

## 2012-04-25 NOTE — Progress Notes (Signed)
When giving pt scheduled PO meds pt opened her mouth for me to put the pills inside, and then after taking a sip of water she spat out the pills in the bedside commode. Medication charted as not given. Will continue to monitor closely since pt has not had her Kepra today.

## 2012-04-25 NOTE — Progress Notes (Signed)
0900 Attempted sorbital in warm prune juice; patient only took a few sips.  Assisted patient to bathroom; no results. Helped patient take a shower. Returned to bedside chair to encourage OOB activity.  1045 Patient given a soaps suds enema; tolerated without distress; no return of stool; assisted to bedside commode; No results.  1330  Assisted patient OOB to ambulate in the hall; took one walk around the nurses station and returned to the room. Patient c/o epigastric pain and stomach upset. Was given po reglan and sips of beverage with mirralax.  Patient vomited up pills and emesis.  Dr. Marye Round updated threw out the day.  Repeat abd. Xray was preformed and replaced Patient IV access and started NS @ 79ml/hr.  Patient was given IV reglan prn.  Calmer and resting in bed.  Refuses oral medications; will continue to encourage OOB activity; staff supervised. And sips of ;liqiuds.

## 2012-04-25 NOTE — Progress Notes (Signed)
Pt is very upset tonight and is repeating "I want to go home". This RN attempted to reassure pt that is necessary for her to stay in the hospital at this time but she responded "I can sign myself out. Call my daughter to pick me up." Pt's daughter called this RN stating that pt is constantly calling her, asking to come and pick her up. Pt's daughter is requesting that antianxiety meds are given to pt so that she can relax and go to sleep. NP Rogue Bussing paged and order for Ativan received and carried. Pt is quietly resting at this time. Will continue to monitor.

## 2012-04-25 NOTE — Progress Notes (Signed)
SLP Cancellation Note  ST received order for Cognitive Evaluation and will be addressed 04/26/12. Sharman Crate MS., CCC-SLP [id number]  [facility] 04/25/2012, 2:42 PM

## 2012-04-25 NOTE — Progress Notes (Signed)
[facility] PROGRESS NOTE  Yosselin Gaier [id number] DOB: 20-Nov-1960 DOA: 04/12/2012 PCP: Richardson Dopp, PA  Assessment/Plan Patient refusing to eat - ? cause  She has severe constipation from clonidine - will prescribe laxatives and try to help patient have a Bm prior to Dc . AXR confirms fecal impaction in the right colon . 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  - as per Drs. 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  - No evidence of acute complications, on Keppra  Recent ventilator dependent respiratory failure:  - resolved  - pt maintaining oxygen saturations > 93% on RA  Acute on chronic diastolic CHF exacerbation, last 2 d ECHO [id number] with EF 55%  - grade II diastolic dysfunction  - clinically stable  Protein calorie malnutrition  - secondary to dysphagia in the setting of brain injury, progressive failure to thrive  - SLP evaluation done but pt refuses to eat  History of CAD status post stenting in 2009  - asymptomatic  History of chronic kidney disease: stage II  - Creatinine is at pt's baseline      Principal Problem:  *Hypertension, accelerated Active Problems:  CAD, NATIVE VESSEL  DIASTOLIC HEART FAILURE, CHRONIC  CHRONIC KIDNEY DISEASE UNSPECIFIED  SAH (subarachnoid hemorrhage)  Protein calorie malnutrition  Subdural hemorrhage  Constipation  Code Status: Full  Family CommunicationWarrick Parisian Daughter [phone] Disposition Plan: Home with home health    Haidyn Kilburg, MD  [facility] Pager [phone]  If 7PM-7AM, please contact night-coverage [email or web address] Password [email or web address] 04/25/2012, 3:01 PM   LOS: 13 days   Brief narrative:  Pt is 51 year old female who was admitted 04/13/2012 with uncontrolled hypertension and was found to have a subarachnoid hemorrhage. 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 The pt also underwent the placement of a VP shunt. Pt was recently discharge from the hospital to rehabilitation on 04/05/2012 and was getting feeds through a Panda tube.  Consultants:  PM&R - consult for inpatient rehabilitation, pt not candidate as not able to participate in therapy and no oral intake  Procedures:  none Antibiotics:  none   HPI/Subjective: vomiting   Objective: Filed Vitals:   04/24/12 2158 04/25/12 0216 04/25/12 0601 04/25/12 1000  BP: 143/84 138/86 140/77 137/76  Pulse: 77 70 74 75  Temp: 98.7 F (37.1 C) 98.5 F (36.9 C) 98.2 F (36.8 C) 98.5 F (36.9 C)  TempSrc: Oral Oral Oral Oral  Resp: 18 16 16 18   Height:      Weight:   66.9 kg (147 lb 7.8 oz)  SpO2: 97% 96% 97% 98%   No intake or output data in the 24 hours ending 04/25/12 1501  Exam:   General:  Alert but confused   Cardiovascular: RRR  Respiratory: CTAB  Abdomen: slight distended , quiet   Data Reviewed: Basic Metabolic Panel:  Lab [id number] 0730 04/24/12 0630 04/22/12 0500 04/21/12 0616 04/20/12 0530  NA 143 138 137 135 135  K 3.7 3.8 4.3 4.3 4.6  CL 106 103 101 99 100  CO2 23 22 24 23 24   GLUCOSE 96 101*  104* 119* 115*  BUN 13 16 18 18 17   CREATININE 1.35* 1.33* 1.48* 1.50* 1.38*  CALCIUM 10.0 9.7 9.7 9.8 9.7  MG -- -- -- -- --  PHOS -- -- -- -- --   Liver Function Tests: No results found for this basename: AST:5,ALT:5,ALKPHOS:5,BILITOT:5,PROT:5,ALBUMIN:5 in the last 168 hours No results found for this basename: LIPASE:5,AMYLASE:5 in the last 168 hours No results found for this basename: AMMONIA:5 in the last 168 hours CBC:  Lab 04/21/12 0616 04/20/12 0530 04/19/12 0555  WBC 8.1 8.6 8.7  NEUTROABS -- -- --  HGB 11.1* 11.2* 11.1*  HCT 33.6* 34.3* 34.2*  MCV 86.8 86.8 87.9  PLT 294 301 304   Cardiac Enzymes: No results found for this basename: CKTOTAL:5,CKMB:5,CKMBINDEX:5,TROPONINI:5 in the last 168 hours BNP (last 3 results)  Basename 03/14/12 0400 03/07/12 0319  PROBNP 1667.0* 7892.0*   CBG: No results found for this basename: GLUCAP:5 in the last 168 hours  No results found for this or any previous visit (from the past 240 hour(s)).   Studies: Dg Abd Portable 1v  04/23/2012  *RADIOLOGY REPORT*  Clinical Data: Abdominal pain and distention.  Nausea.  PORTABLE ABDOMEN - 1 VIEW 04/23/2012 1848 hours:  Comparison: Portable abdomen x-rays 04/13/2012 dating back to 04/02/2012.  Findings: Bowel gas pattern nonobstructive.  Gas within normal caliber small bowel diffusely.  Very large amount of stool in the cecum, ascending colon, and proximal transverse colon, unchanged. Gas within nondistended descending colon, sigmoid colon, and rectum.  No suggestion of free air on the supine image.  Right- sided ventriculoperitoneal shunt catheter tubing with its tip in the right side of the upper pelvis.  Regional skeleton unremarkable.  No abnormal calcifications.  IMPRESSION: No acute abdominal abnormality.  Very large stool burden in the cecum, ascending colon, and proximal transverse colon, unchanged from prior examinations.  Original Report Authenticated By: Deniece Portela, M.D.     Scheduled Meds:    . acetaminophen  650 mg Oral Once  . amLODipine  10 mg Oral Daily  . hydrALAZINE  50 mg Oral Q6H  . labetalol  400 mg Oral TID  . levETIRAcetam  500 mg Oral BID  . lisinopril  10 mg Oral Daily  . metoCLOPramide  10 mg Oral TID AC  . polyethylene glycol  68 g Oral Daily  . senna-docusate  2 tablet Oral BID  . DISCONTD: cloNIDine  0.1 mg Oral BID  . DISCONTD: magnesium citrate  1 Bottle Oral Once  . DISCONTD: metoCLOPramide (REGLAN) injection  10 mg Intravenous Q6H   Continuous Infusions:    . sodium chloride

## 2012-04-26 DIAGNOSIS — E46 Unspecified protein-calorie malnutrition: Secondary | ICD-10-CM

## 2012-04-26 LAB — BASIC METABOLIC PANEL
BUN: 9 mg/dL (ref 6–23)
CO2: 23 mEq/L (ref 19–32)
Calcium: 9.3 mg/dL (ref 8.4–10.5)
Chloride: 106 mEq/L (ref 96–112)
Creatinine, Ser: 1.16 mg/dL — ABNORMAL HIGH (ref 0.50–1.10)

## 2012-04-26 MED ORDER — LORAZEPAM 2 MG/ML IJ SOLN
0.5000 mg | Freq: Once | INTRAMUSCULAR | Status: AC
  Administered 2012-04-26: 0.5 mg via INTRAVENOUS
  Filled 2012-04-26: qty 1

## 2012-04-26 MED ORDER — PANTOPRAZOLE SODIUM 40 MG PO TBEC
40.0000 mg | DELAYED_RELEASE_TABLET | Freq: Every day | ORAL | Status: DC
  Administered 2012-04-26: 40 mg via ORAL
  Filled 2012-04-26: qty 1

## 2012-04-26 MED ORDER — ACETAMINOPHEN 650 MG RE SUPP: 650.0000 mg | Freq: Four times a day (QID) | RECTAL | Status: DC | PRN

## 2012-04-26 MED ORDER — ACETAMINOPHEN 325 MG PO TABS
650.0000 mg | ORAL_TABLET | Freq: Four times a day (QID) | ORAL | Status: DC | PRN
  Administered 2012-04-26: 650 mg via ORAL
  Filled 2012-04-26: qty 2

## 2012-04-26 MED ORDER — POTASSIUM CHLORIDE CRYS ER 20 MEQ PO TBCR
40.0000 meq | EXTENDED_RELEASE_TABLET | Freq: Once | ORAL | Status: DC
  Filled 2012-04-26: qty 2

## 2012-04-26 NOTE — Progress Notes (Signed)
Pt woke up,tearful saying "Cut me loose, I'm going home now". She is getting out of bed and puling on her IV. Tried to reassure pt that we are only trying to help her and that she'll be able to go home as son as she feels better. She started yelling "I am only one here you let everyone else go and are you holding me here, why? " NP Rogue Bussing paged and order for Ativan obtained. Medication administered and pt is lying in bed still tearful, but more calmer. Emotional support given. Will continue to monitor.

## 2012-04-26 NOTE — Evaluation (Signed)
Speech Language Pathology Evaluation Patient Details Name: [name] MRN: [id number] DOB: April 05, 1961 Today's Date: 04/26/2012 Time: [id number] SLP Time Calculation (min): 27 min  Problem List:  Patient Active Problem List  Diagnosis  . CAD, NATIVE VESSEL  . DIASTOLIC HEART FAILURE, CHRONIC  . CHRONIC KIDNEY DISEASE UNSPECIFIED  . SAH (subarachnoid hemorrhage)  . Acidosis  . Protein calorie malnutrition  . Renal artery stenosis  . Subdural hemorrhage  . Hypertension, accelerated  . Constipation   Past Medical History:  Past Medical History  Diagnosis Date  . HTN (hypertension)     resistant  . Hypercholesterolemia   . Tobacco abuse     resumed smoking half a pack a day. She was a smoker in the past and states she was abl eto stop in the past using nicotine patches  . CAD (coronary artery disease)     Pt has St elevation MI in Feb 2009. Left hear tcath at tha ttime showed a 70% distal LAD lesion that was hazy consistent with a plaque rupture. She had a 50% distal circumflex stenosis an a 95% distal RCA stenosis. She did havve drug eluting stent placed in her distal RCA  . Chronic kidney disease     most recent creatinine 1.4 in 3/10  . Intracranial hemorrhage, spontaneous intraparenchymal, idiopathic, remote, resolved   . Diastolic heart failure     pt most recent echo was in Feb 2009 w an EF of 60% and severe left ventricular hypertrophy. The pt did have evidence of diastolic dysfunction. The RV appeared normal  . Subarachnoid hemorrhage     03/2012   Past Surgical History:  Past Surgical History  Procedure Date  . Ventriculoperitoneal shunt 03/27/2012    Procedure: SHUNT INSERTION VENTRICULAR-PERITONEAL;  Surgeon: Winfield Cunas, MD;  Location: [facility];  Service: Neurosurgery;  Laterality: N/A;  Ventricular-Peritoneal Shunt Insertion  . Cardiac catheterization   . Coronary angioplasty    HPI:  [name] is a 51 y.o. right-handed female with previous  intraparenchymal hemorrhage in the past admitted 03/03/2012 with headache, nausea and vomiting. CT of the head showed diffuse subarachnoid hemorrhage in the basilar cisterns, sylvian fissures, along the tentorium and anterior falx. Followup neurosurgery Dr. Cyndy Freeze. Patient had been intubated and placement of ventriculostomy catheter. Patient was later extubated on 03/04/2012 [name] course with obstructive hydrocephalus and ultimately underwent shunt insertion ventriculoperitoneal right side on 03/27/2012. Patient did again require intubation due to respiratory difficulties and again extubated 03/29/2012.  Admitted to inpatient rehab unit on 6/17; readmitted to [name] 04/13/2012 with uncontrolled hypertension and was found to have a subarachnoid hemorrhage. 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  Pt has had ongoing poor PO intake due to cognitive deficits; physiological swallow function is [name].  Followed while on CIR for cognition - appropriate for re-evaluation and follow-up while back in acute care.      Assessment / Plan / Recommendation Clinical Impression  Pt remains with severe cognitive impairments characterized by decreased sustained attention, functional problem solving, working memory, initiation, intellectual awareness, and overall judgment impacting overall safety and independence for functional tasks.  Pt's overall cognitive abilities also impact pt's ability for self-feeding and PO intake. Will require 24 hour supervision upon D/C.    SLP Assessment  Patient needs continued Speech Language Pathology Services    Follow Up Recommendations  Skilled Nursing facility    Frequency and Duration min 2x/week  2 weeks   Pertinent Vitals/Pain no pain   SLP  Goals  SLP Goals Potential to Achieve Goals: Good Progress/Goals/Alternative treatment plan discussed with pt/caregiver and they: Patient unable to parrticipate in goal setting SLP Goal #1: Pt will focus and sustain  attention to functional task in nondistracting environment for 2 minutes with mod assist. SLP Goal #2: Pt will demonstrate improved verbal/physical response time in <15 seconds with mod assist. SLP Goal #3: Pt will utilize environmental cues to facilitate orientation to surroundings/time with mod assist.  Diane Leidy L. Tivis Ringer, [place] CCC/SLP Pager [phone]   [name] 04/26/2012, 3:02 PM

## 2012-04-26 NOTE — Progress Notes (Signed)
Occupational Therapy Treatment Patient Details Name: Sarie Bynoe MRN: [id number] DOB: 28-Oct-1960 Today's Date: 04/26/2012 Time: [id number] OT Time Calculation (min): 10 min  OT Assessment / Plan / Recommendation Comments on Treatment Session Pt with flat affect, but desires to be able to return home. Pt with dynamic standing balance deficits which put her at risk for falls especially during functional activies. Pt educated to perform ADLs with assist. Pt verbalizes understanding, but question pt's awareness of need for assist/decreased safety    Follow Up Recommendations  Home health OT;Supervision/Assistance - 24 hour    Barriers to Discharge       Equipment Recommendations  Rolling walker with 5" wheels;Wheelchair (measurements)    Recommendations for Other Services    Frequency     Plan Discharge plan needs to be updated    Precautions / Restrictions Precautions Precautions: Fall Restrictions Weight Bearing Restrictions: No   Pertinent Vitals/Pain Pt denies any pain at this time    ADL  Lower Body Dressing: Performed;Min guard Where Assessed - Lower Body Dressing: Supported sit to stand Toilet Transfer: Chartered loss adjuster Method: Sit to stand Tub/Shower Transfer: Simulated;Minimal assistance Tub/Shower Transfer Method: Ambulating (sidestepping in and out of tub) Equipment Used: Gait belt;Rolling walker Transfers/Ambulation Related to ADLs: Supervision with RW ambulation bed to bathroom ADL Comments: Pt states family will be available to provide necessary assist; however, pt with difficulty providing safe instruction for tub/shower transfers so as to be able to guide family when at home    OT Diagnosis:    OT Problem List:   OT Treatment Interventions:     OT Goals ADL Goals ADL Goal: Grooming - Progress: Progressing toward goals ADL Goal: Lower Body Bathing - Progress: Met ADL Goal: Toilet Transfer - Progress: Met ADL Goal: Additional Goal  #1 - Progress: Progressing toward goals ADL Goal: Additional Goal #2 - Progress: Progressing toward goals  Visit Information  Last OT Received On: 04/26/12 Assistance Needed: +1    Subjective Data      Prior Functioning       Cognition  Behavior During Session: Lethargic Awareness of Errors: Assistance required to identify errors made;Assistance required to correct errors made Problem Solving: slow to process Cognition - Other Comments: decreased motivation    Mobility Bed Mobility Right Sidelying to Sit: 5: Supervision;HOB flat Transfers Sit to Stand: 5: Supervision;4: Min guard;From bed Stand to Sit: 5: Supervision;With armrests;To chair/3-in-1 Details for Transfer Assistance: pt tremulous during session   Exercises    Balance Dynamic Standing Balance Dynamic Standing - Balance Support: Right upper extremity supported Dynamic Standing - Level of Assistance: 5: Stand by assistance (during functional activities)  End of Session OT - End of Session Equipment Utilized During Treatment: Gait belt Activity Tolerance: Patient limited by fatigue Patient left: with call bell/phone within reach (sitting on toilet with sitter in room)  GO     Zelpha Messing 04/26/2012, 2:22 PM

## 2012-04-26 NOTE — Progress Notes (Signed)
[facility] PROGRESS NOTE  [name] [id number] DOB: [birth year] DOA: 04/12/2012 PCP: Richardson Dopp, PA  Assessment/Plan Patient refusing to eat - ? cause  She has severe constipation from clonidine - will prescribe laxatives and try to help patient have a Bm prior to Dc . AXR confirms fecal impaction in the right colon . 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  - as per Drs. 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  - No evidence of acute complications, on Keppra   Recent ventilator dependent respiratory failure:  - resolved  - pt maintaining oxygen saturations > 93% on RA   Acute on chronic diastolic CHF exacerbation, last 2 d ECHO [id number] with EF 55%  - grade II diastolic dysfunction  - clinically stable  Protein calorie malnutrition  - secondary to dysphagia in the setting of brain injury, progressive failure to thrive  - SLP evaluation done but pt refuses to eat  History of CAD status post stenting in  2009  - asymptomatic  History of chronic kidney disease: stage II  - Creatinine is at pt's baseline     Principal Problem:  *Hypertension, accelerated Active Problems:  CAD, NATIVE VESSEL  DIASTOLIC HEART FAILURE, CHRONIC  CHRONIC KIDNEY DISEASE UNSPECIFIED  SAH (subarachnoid hemorrhage)  Protein calorie malnutrition  Subdural hemorrhage  Constipation  Code Status: Full  Family CommunicationWarrick Parisian Daughter [phone] Disposition Plan: Home with home health    Zhoe Catania, MD  [facility] Pager [phone]  If 7PM-7AM, please contact night-coverage [email or web address] Password [email or web address] 04/26/2012, 7:52 AM   LOS: 14 days   Brief narrative:  Pt is 51 year old female who was admitted 04/13/2012 with uncontrolled hypertension and was found to have a subarachnoid hemorrhage. 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 The pt also underwent the placement of a VP shunt. Pt was recently discharge from the hospital to rehabilitation on 04/05/2012 and was getting feeds through a Panda tube.  Consultants:  PM&R - consult for inpatient rehabilitation, pt not candidate as not able to participate in therapy and no oral intake  Procedures:  none Antibiotics:  none   HPI/Subjective: Wants to go home but vomited her water and food yesterday    Objective: Filed Vitals:   04/25/12 1800 04/25/12 2200 04/26/12 0246 04/26/12 0540  BP: 136/80 148/74 122/78 155/78  Pulse: 70 72 80 87  Temp: 98.1 F (36.7 C) 97.9 F (36.6 C)  98.4 F (36.9 C)  TempSrc: Oral Oral Oral Oral  Resp: 18 18 19 19   Height:      Weight:      SpO2: 99% 99% 98% 98%   No intake or output data in the 24 hours ending 04/26/12 0752  Exam:  Alert and following commands  Does not have any insight into what is happening  CVS: rrr  Rs: ctab   Abdomen : distended, soft, BS hyperactive     Data Reviewed: Basic Metabolic Panel:  Lab [id number] 0500 04/25/12 0730  04/24/12 0630 04/22/12 0500 04/21/12 0616  NA 141 143 138 137 135  K 3.4* 3.7 3.8 4.3 4.3  CL 106 106 103 101 99  CO2 23 23 22 24 23   GLUCOSE 92 96 101* 104* 119*  BUN 9 13 16 18 18   CREATININE 1.16* 1.35* 1.33* 1.48* 1.50*  CALCIUM 9.3 10.0 9.7 9.7 9.8  MG -- -- -- -- --  PHOS -- -- -- -- --   Liver Function Tests: No results found for this basename: AST:5,ALT:5,ALKPHOS:5,BILITOT:5,PROT:5,ALBUMIN:5 in the last 168 hours No results found for this basename: LIPASE:5,AMYLASE:5 in the last 168 hours No results found for this basename: AMMONIA:5 in the last 168 hours CBC:  Lab 04/21/12 0616 04/20/12 0530  WBC 8.1 8.6  NEUTROABS -- --  HGB 11.1* 11.2*  HCT 33.6* 34.3*  MCV 86.8 86.8  PLT 294 301   Cardiac Enzymes: No results found for this basename: CKTOTAL:5,CKMB:5,CKMBINDEX:5,TROPONINI:5 in the last 168 hours BNP (last 3 results)  Basename 03/14/12 0400 03/07/12 0319  PROBNP 1667.0* 7892.0*   CBG: No results found for this basename: GLUCAP:5 in the last 168 hours  No results found for this or any previous visit (from the past 240 hour(s)).   Studies: Dg Abd Portable 1v  04/25/2012  *RADIOLOGY REPORT*  Clinical Data: Nausea and vomiting.  PORTABLE ABDOMEN - 1 VIEW  Comparison: Portable abdomen x-ray 04/23/2012 dating back to 04/02/2012.  Findings: Bowel gas pattern unremarkable without evidence of obstruction or significant ileus.  Interval decrease in the bowel gas since the examination 2 days ago.  Persistent large stool burden in the cecum, ascending colon, and transverse colon.  No visible opaque urinary tract calculi.  Phleboliths in the right side of the pelvis.  Right-sided ventriculoperitoneal shunt catheter tubing with its tip in the right upper pelvis.  IMPRESSION: No acute abdominal abnormality.  Large stool burden in the cecum, ascending colon, and transverse colon as noted previously.  Original Report Authenticated By: Deniece Portela, M.D.    Scheduled Meds:      . acetaminophen  650 mg Oral Once  . amLODipine  10 mg Oral Daily  . hydrALAZINE  50 mg Oral Q6H  . labetalol  400 mg Oral TID  . levETIRAcetam  500 mg Oral BID  . lisinopril  10 mg Oral Daily  . LORazepam  0.5 mg Intravenous Once  . LORazepam  0.5 mg Intravenous Once  . metoCLOPramide  10 mg Oral TID AC  . pantoprazole (PROTONIX) IV  40 mg Intravenous Q24H  . polyethylene glycol  68 g Oral Daily  . potassium chloride  40 mEq Oral Once  . senna-docusate  2 tablet Oral BID  . DISCONTD: cloNIDine  0.1 mg Oral BID  . DISCONTD: magnesium citrate  1 Bottle Oral Once  . DISCONTD: metoCLOPramide (REGLAN) injection  10 mg Intravenous Q6H   Continuous Infusions:    . DISCONTD: sodium chloride 75 mL/hr at 04/25/12 1545

## 2012-04-26 NOTE — Progress Notes (Signed)
Physical Therapy Treatment Patient Details Name: [name] MRN: [id number] DOB: 1961/10/04 Today's Date: 04/26/2012 Time: [id number] PT Time Calculation (min): 16 min  PT Assessment / Plan / Recommendation Comments on Treatment Session  Pt was very quiet with flat affect.  She was willing to ambulate and tolerated an increased distance.  Attempted to walk without RW using hand hold assist, but pt does rely on RW for balance.  Pt tends to sway back and forth, and RW is safer option.  Continue with POC.    Follow Up Recommendations       Barriers to Discharge        Equipment Recommendations  Rolling walker with 5" wheels;Wheelchair (measurements)    Recommendations for Other Services    Frequency Min 3X/week   Plan Discharge plan remains appropriate;Frequency remains appropriate    Precautions / Restrictions Precautions Precautions: Fall Restrictions Weight Bearing Restrictions: No   Pertinent Vitals/Pain     Mobility  Bed Mobility Bed Mobility: Not assessed Right Sidelying to Sit: 5: Supervision;HOB flat Transfers Sit to Stand: From bed;With upper extremity assist;5: Supervision Stand to Sit: 5: Supervision;To bed;With upper extremity assist Details for Transfer Assistance: Pt required assistance for safety and balance, slight sway upon standing Ambulation/Gait Ambulation/Gait Assistance: 4: Min guard Ambulation Distance (Feet): 350 Feet Assistive device: Rolling walker Ambulation/Gait Assistance Details: Pt required A for balance, pt sways when walks.   Gait Pattern: Step-through pattern;Decreased stride length;Wide base of support Gait velocity: decreased General Gait Details: Pt tends to sway side to side during ambulation.  Pt. frequently picks up RW to move it when navigating obstacles in room.   Stairs: No    Exercises     PT Diagnosis:    PT Problem List:   PT Treatment Interventions:     PT Goals Acute Rehab PT Goals PT Goal: Sit at Edge Of Bed -  Progress: Progressing toward goal PT Goal: Sit to Stand - Progress: Progressing toward goal PT Goal: Stand to Sit - Progress: Progressing toward goal PT Goal: Stand - Progress: Progressing toward goal PT Goal: Ambulate - Progress: Progressing toward goal  Visit Information  Last PT Received On: 04/26/12 Assistance Needed: +1    Subjective Data      Cognition  Overall Cognitive Status: Impaired Area of Impairment: Attention;Safety/judgement;Awareness of errors;Awareness of deficits Arousal/Alertness: Awake/alert Behavior During Session: Flat affect Awareness of Errors: Assistance required to identify errors made;Assistance required to correct errors made Problem Solving: slow to process Cognition - Other Comments: decreased motivation    Balance  Static Sitting Balance Static Sitting - Balance Support: No upper extremity supported Static Sitting - Level of Assistance: 5: Stand by assistance Static Sitting - Comment/# of Minutes: Pt was seated EOB washing prior to treatment (5 min) Dynamic Standing Balance Dynamic Standing - Balance Support: Right upper extremity supported Dynamic Standing - Level of Assistance: 5: Stand by assistance (during functional activities)  End of Session PT - End of Session Equipment Utilized During Treatment: Gait belt Activity Tolerance: Patient tolerated treatment well Patient left: in bed;with nursing in room Nurse Communication: Mobility status   GP     Sharilynn Cassity, [place], [place] 04/26/2012, 3:14 PM

## 2012-04-27 LAB — BASIC METABOLIC PANEL
CO2: 22 mEq/L (ref 19–32)
Calcium: 9.3 mg/dL (ref 8.4–10.5)
Creatinine, Ser: 1.27 mg/dL — ABNORMAL HIGH (ref 0.50–1.10)
GFR calc non Af Amer: 48 mL/min — ABNORMAL LOW (ref >90)
Glucose, Bld: 124 mg/dL — ABNORMAL HIGH (ref 70–99)

## 2012-04-27 MED ORDER — OMEPRAZOLE 20 MG PO CPDR: 20.0000 mg | DELAYED_RELEASE_CAPSULE | Freq: Every day | ORAL | Status: DC

## 2012-04-27 MED ORDER — NITROGLYCERIN 0.4 MG SL SUBL: 0.4000 mg | SUBLINGUAL_TABLET | SUBLINGUAL | Status: DC | PRN

## 2012-04-27 MED ORDER — LOSARTAN POTASSIUM 25 MG PO TABS: 25.0000 mg | ORAL_TABLET | Freq: Two times a day (BID) | ORAL | Status: DC

## 2012-04-27 MED ORDER — LABETALOL HCL 200 MG PO TABS: 400.0000 mg | ORAL_TABLET | Freq: Three times a day (TID) | ORAL | Status: DC

## 2012-04-27 MED ORDER — POLYETHYLENE GLYCOL 3350 17 GM/SCOOP PO POWD: 34.0000 g | Freq: Every day | ORAL | Status: AC

## 2012-04-27 MED ORDER — SENNOSIDES-DOCUSATE SODIUM 8.6-50 MG PO TABS: 2.0000 | ORAL_TABLET | Freq: Two times a day (BID) | ORAL | Status: DC

## 2012-04-27 MED ORDER — AMLODIPINE BESYLATE 10 MG PO TABS: 10.0000 mg | ORAL_TABLET | Freq: Every day | ORAL | Status: DC

## 2012-04-27 MED ORDER — LEVETIRACETAM 500 MG PO TABS: 500.0000 mg | ORAL_TABLET | Freq: Two times a day (BID) | ORAL | Status: DC

## 2012-04-27 MED ORDER — HYDRALAZINE HCL 50 MG PO TABS: 50.0000 mg | ORAL_TABLET | Freq: Four times a day (QID) | ORAL | Status: DC

## 2012-04-27 MED ORDER — METOCLOPRAMIDE HCL 10 MG PO TABS: 10.0000 mg | ORAL_TABLET | Freq: Three times a day (TID) | ORAL | Status: DC | PRN

## 2012-04-27 NOTE — Discharge Summary (Signed)
Physician Discharge Summary  [name] [id number] DOB: 1960/11/23 DOA: 04/12/2012  PCP: Richardson Dopp, PA  Admit date: 04/12/2012 Discharge date: 04/27/2012   Discharge Diagnoses:  Principal Problem:  *Hypertension, accelerated Active Problems:  CAD, NATIVE VESSEL  DIASTOLIC HEART FAILURE, CHRONIC  CHRONIC KIDNEY DISEASE UNSPECIFIED  SAH (subarachnoid hemorrhage)  Protein calorie malnutrition  Subdural hemorrhage  Constipation   Discharge Condition: Good, and the patient was eating a regular diet, she was walking around the room cheerful and was excited to be able to go home. Patient does still have cognitive deficits and requires 24-hour supervision  Diet recommendation: Heart healthy  History of present illness:  51 year old woman who suffered a subarachnoid hemorrhage for which she underwent coiling, was transferred from the inpatient rehabilitation unit after she was unable to participate in activities. She was also having nausea vomiting, refusing to eat and having extreme elevated blood pressure readings.  Hospital Course:   Accelerated/Malignant hypertension: - on admission patient required NTG drip to control extreme elevated blood pressure readings. This was a particularly difficult situation as the patient had recent subarachnoid hemorrhage with aneurysm coiling. Initial blood pressure control required high doses of clonidine, namely 0.3 mg 3 times a day After patient developed massive right colon impaction, we gradually took her off of the clonidine and place her on hydralazine. We maintained her blood pressure control throughout this process.  Patient refusing to eat - ? cause - suspect the patient's refusal to eat was due to behavioral issues, cognitive impairment and massive constipation with impaction She had severe constipation and the right colon impaction from clonidine -  The patient was willing to eat after impaction resolved  Constipation  It took [place] about  5 days of concentrated efforts using miralax, sorbitol, MOM, enemas . 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  - No evidence of acute complications, on Keppra for seizure prophylaxis   Acute on chronic diastolic CHF exacerbation, last 2 d ECHO 052013 with EF 55%  - grade II diastolic dysfunction  - clinically stable  Protein calorie malnutrition  - secondary to dysphagia in the setting of brain injury, progressive failure to thrive  - SLP evaluation done but pt refuses to eat  History of CAD status post stenting in 2009  - asymptomatic  History of chronic kidney disease: stage II  - Creatinine is at pt's baseline      Consultations:  None  Discharge Exam: Filed Vitals:   04/27/12 0615  BP: 141/79  Pulse: 77  Temp: 98.3 F (36.8 C)  Resp: 18   Filed Vitals:   04/26/12 2153 04/26/12 2156 04/27/12 0021 04/27/12 0615  BP: 159/76 159/76 147/75 141/79  Pulse: 79 82  77  Temp:  98.8 F (37.1 C)  98.3 F (36.8 C)  TempSrc:  Oral  Oral  Resp:  18  18  Height:      Weight:    67 kg (147 lb 11.3 oz)  SpO2:  100%  100%   General: Alert, oriented to self and family Cardiovascular: Regular rate and rhythm without murmurs rubs or gallops Respiratory: Clear to auscultation bilaterally Abdomen is soft nontender with good bowel sounds  Discharge Instructions  Discharge Orders    Future Orders Please Complete By Expires   Diet - low sodium heart healthy      Increase activity slowly        Medication  List  As of 04/27/2012 10:39 AM   STOP taking these medications         aspirin EC 81 MG tablet      carvedilol 12.5 MG tablet      nitroGLYCERIN 2 % ointment      spironolactone 25 MG tablet         TAKE these medications         amLODipine 10 MG tablet   Commonly known as: NORVASC   Take 1 tablet (10 mg total) by mouth daily. This is for blood pressure       hydrALAZINE 50 MG tablet   Commonly known as: APRESOLINE   Take 1 tablet (50 mg total) by mouth 4 (four) times daily. This is for blood pressure      labetalol 200 MG tablet   Commonly known as: NORMODYNE   Take 2 tablets (400 mg total) by mouth 3 (three) times daily.      levETIRAcetam 500 MG tablet   Commonly known as: KEPPRA   Take 1 tablet (500 mg total) by mouth 2 (two) times daily.      losartan 25 MG tablet   Commonly known as: COZAAR   Take 1 tablet (25 mg total) by mouth 2 (two) times daily.      metoCLOPramide 10 MG tablet   Commonly known as: REGLAN   Take 1 tablet (10 mg total) by mouth 3 (three) times daily as needed (nausea).      nitroGLYCERIN 0.4 MG SL tablet   Commonly known as: NITROSTAT   Place 1 tablet (0.4 mg total) under the tongue every 5 (five) minutes as needed for chest pain. For chest pain      omeprazole 20 MG capsule   Commonly known as: PRILOSEC   Take 1 capsule (20 mg total) by mouth daily.      polyethylene glycol powder powder   Commonly known as: GLYCOLAX/MIRALAX   Take 34 g by mouth daily.      senna-docusate 8.6-50 MG per tablet   Commonly known as: Senokot-S   Take 2 tablets by mouth 2 (two) times daily.           Follow-up Information    Follow up with [facility] RN, Physical Therapy, Occupational Therapy, aide and Social Worker)    Contact information:   [phone]      Follow up with Richardson Dopp, PA on 05/03/2012. (9:50 am)    Contact information:   [address] [place] [phone]           The results of significant diagnostics from this hospitalization (including imaging, microbiology, ancillary and laboratory) are listed below for reference.    Significant Diagnostic Studies: Ct Head Wo Contrast  04/12/2012  *RADIOLOGY REPORT*  Clinical Data: Headaches in the front and back of head.  CT HEAD WITHOUT CONTRAST  Technique:  Contiguous axial images  were obtained from the base of the skull through the vertex without contrast.  Comparison: 04/01/2012  Findings: Postoperative changes with right frontal temporal craniotomy and aneurysm clips over the right middle cerebral and right circle of Willis regions.  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 Right frontal ventriculostomy with trans frontal ventricular shunt tube.  Shunt tube tip is in the region of the third ventricle. Ventricles remain decompressed without significant change since the previous study.  Streak artifact from metallic hardware limits  visualization of some areas of brain but the visualized brain parenchyma appears to be homogeneous without mass effect or midline shift.  No abnormal extra-axial fluid collections.  Gray-white matter junctions are distinct.  Basal cisterns are not effaced.  No evidence of acute intracranial hemorrhage.  No significant change since previous study.  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  No acute intracranial abnormality.  No significant changes since the previous study.  Original Report Authenticated By: Neale Burly, M.D.   Ct Head Wo Contrast  04/01/2012  *RADIOLOGY REPORT*  Clinical Data: Follow-up aneurysm clipping  CT HEAD WITHOUT CONTRAST  Technique:  Contiguous axial images were obtained from the base of the skull through the vertex without contrast.  Comparison: Multiple priors.  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  Ventricular catheter crosses the midline and exits the third ventricle, lying with its tip either in the left cerebral peduncle or interpeduncular notch.  Ventricular size is normal on today's study, significantly improved from 03/24/2012.  Small amount of fluid surrounds the ventricular catheter in its course through the right  frontal cortex and subcortical white matter, decreased from priors.  No visible acute stroke, new subarachnoid hemorrhage, mass lesion, or significant extra-axial fluid. 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  Endovascular coil basilar tip grossly unchanged.  IMPRESSION: Satisfactory appearance status post ventriculoperitoneal shunting. Ventricular size now within normal limits.  Original Report Authenticated By: Staci Righter, M.D.   Dg Abd Portable 1v  04/25/2012  *RADIOLOGY REPORT*  Clinical Data: Nausea and vomiting.  PORTABLE ABDOMEN - 1 VIEW  Comparison: Portable abdomen x-ray 04/23/2012 dating back to 04/02/2012.  Findings: Bowel gas pattern unremarkable without evidence of obstruction or significant ileus.  Interval decrease in the bowel gas since the examination 2 days ago.  Persistent large stool burden in the cecum, ascending colon, and transverse colon.  No visible opaque urinary tract calculi.  Phleboliths in the right side of the pelvis.  Right-sided ventriculoperitoneal shunt catheter tubing with its tip in the right upper pelvis.  IMPRESSION: No acute abdominal abnormality.  Large stool burden in the cecum, ascending colon, and transverse colon as noted previously.  Original Report Authenticated By: Deniece Portela, M.D.   Dg Abd Portable 1v  04/23/2012  *RADIOLOGY REPORT*  Clinical Data: Abdominal pain and distention.  Nausea.  PORTABLE ABDOMEN - 1 VIEW 04/23/2012 1848 hours:  Comparison: Portable abdomen x-rays 04/13/2012 dating back to 04/02/2012.  Findings: Bowel gas pattern nonobstructive.  Gas within normal caliber small bowel diffusely.  Very large amount of stool in the cecum, ascending colon, and proximal transverse colon, unchanged. Gas within nondistended descending colon, sigmoid colon, and rectum.  No suggestion of free air on the supine image.  Right- sided ventriculoperitoneal shunt catheter tubing with its tip in the right side of the upper pelvis.  Regional  skeleton unremarkable.  No abnormal calcifications.  IMPRESSION: No acute abdominal abnormality.  Very large stool burden in the cecum, ascending colon, and proximal transverse colon, unchanged from prior examinations.  Original Report Authenticated By: Deniece Portela, M.D.   Dg Abd Portable 1v  04/13/2012  *RADIOLOGY REPORT*  Clinical Data: Panda tube advancement  PORTABLE ABDOMEN - 1 VIEW  Comparison: 04/12/2012  Findings: Weighted feeding tube is looped in the proximal gastric body, kinked at the level of the pylorus, and points back towards the distal gastric body.  Nonobstructive bowel  gas pattern.  VP shunt catheter terminating in the right pelvis.  IMPRESSION: Weighted feeding tube is looped/kinked in the stomach and terminates in the distal gastric body, as described above.  Given redundancy, if post pyloric positioning is required, removal and replacement would be necessary.  Original Report Authenticated By: Julian Hy, M.D.   Dg Abd Portable 1v  04/12/2012  *RADIOLOGY REPORT*  Clinical Data: Panda placement  PORTABLE ABDOMEN - 1 VIEW  Comparison: 04/02/2012  Findings: There is nonspecific nonobstructive bowel gas pattern. An  NG feeding tube is noted coiled within stomach with tip in mid stomach. Stable ventriculoperitoneal shunt tubing in the right abdomen.  IMPRESSION: NG feeding tube coiled within stomach with tip in mid stomach.  Original Report Authenticated By: Lahoma Crocker, M.D.   Dg Abd Portable 1v  04/02/2012  *RADIOLOGY REPORT*  Clinical Data: Feeding tube reinsertion.  PORTABLE ABDOMEN - 1 VIEW  Comparison: Chest x-ray 04/02/2012.  Findings: Tip of feeding tube is now in the body of the stomach.  A ventriculoperitoneal shunt tubing is seen projecting over the right upper quadrant of the abdomen.  IMPRESSION: 1.  Tip of feeding tube is now in the body of the stomach.  Original Report Authenticated By: Etheleen Mayhew, M.D.   Dg Abd Portable 1v  04/02/2012  *RADIOLOGY  REPORT*  Clinical Data: Feeding tube placement  PORTABLE ABDOMEN - 1 VIEW  Comparison: 03/29/2012  Findings: Feeding tube tip projects over the distal stomach. Nonobstructive bowel gas pattern.  Hemidiaphragms are excluded from the image.  No acute osseous finding.  IMPRESSION: Feeding tube tip projects over the distal stomach.  Original Report Authenticated By: Suanne Marker, M.D.   Dg Abd Portable 1v  03/29/2012  *RADIOLOGY REPORT*  Clinical Data: Evaluate feeding tube placement  PORTABLE ABDOMEN - 1 VIEW  Comparison: 03/29/2012  Findings: Feeding tube is looped within the stomach.  The tip is within the proximal stomach near the GE junction.  There is a ventriculoperitoneal shunt with tubing coiled in the right upper quadrant of the abdomen.  The bowel gas pattern appears nonobstructed.  IMPRESSION:  1.  The feeding tube is coiled within the stomach with tip in the proximal stomach.  Original Report Authenticated By: Angelita Ingles, M.D.   Dg Abd Portable 1v  03/29/2012  *RADIOLOGY REPORT*  Clinical Data: Feeding tube placement.  PORTABLE ABDOMEN - 1 VIEW  Comparison: None.  Findings: Feeding tube projects over the distal descending duodenum.  Shunt catheter terminates the right upper quadrant.  Gas is seen in the colon and stomach.  IMPRESSION: Feeding tube terminates in the descending duodenum.  Original Report Authenticated By: Luretha Rued, M.D.    Microbiology: No results found for this or any previous visit (from the past 240 hour(s)).   Labs: Basic Metabolic Panel:  Lab [id number] 04/26/12 0500 04/25/12 0730 04/24/12 0630 04/22/12 0500  NA 145 141 143 138 137  K 3.1* 3.4* 3.7 3.8 4.3  CL 108 106 106 103 101  CO2 22 23 23 22 24   GLUCOSE 124* 92 96 101* 104*  BUN 6 9 13 16 18   CREATININE 1.27* 1.16* 1.35* 1.33* 1.48*  CALCIUM 9.3 9.3 10.0 9.7 9.7  MG -- -- -- -- --  PHOS -- -- -- -- --   Liver Function Tests: No results found for this basename:  AST:5,ALT:5,ALKPHOS:5,BILITOT:5,PROT:5,ALBUMIN:5 in the last 168 hours No results found for this basename: LIPASE:5,AMYLASE:5 in the last 168 hours No results found for this basename:  AMMONIA:5 in the last 168 hours CBC:  Lab 04/21/12 0616  WBC 8.1  NEUTROABS --  HGB 11.1*  HCT 33.6*  MCV 86.8  PLT 294   Cardiac Enzymes: No results found for this basename: CKTOTAL:5,CKMB:5,CKMBINDEX:5,TROPONINI:5 in the last 168 hours BNP: BNP (last 3 results)  Basename 03/14/12 0400 03/07/12 0319  PROBNP 1667.0* 7892.0*   CBG: No results found for this basename: GLUCAP:5 in the last 168 hours  Time coordinating discharge: 45 minutes  Signed:  Shatima Zalar  [facility] 04/27/2012, 10:39 AM

## 2012-04-27 NOTE — Discharge Instructions (Signed)
STROKE/TIA DISCHARGE INSTRUCTIONS SMOKING Cigarette smoking nearly doubles your risk of having a stroke & is the single most alterable risk factor  If you smoke or have smoked in the last 12 months, you are advised to quit smoking for your health.  Most of the excess cardiovascular risk related to smoking disappears within a year of stopping.  Ask you doctor about anti-smoking medications  [place] Quit Line: 1-800-QUIT NOW  Free Smoking Cessation Classes [phone]  CHOLESTEROL Know your levels; limit fat & cholesterol in your diet  Lipid Panel     Component Value Date/Time   CHOL 167 04/18/2011 1207   TRIG 102 03/30/2012 0416   HDL 37.10* 04/18/2011 1207   CHOLHDL 5 04/18/2011 1207   VLDL 23.0 04/18/2011 1207   LDLCALC 107* 04/18/2011 1207      Many patients benefit from treatment even if their cholesterol is at goal.  Goal: Total Cholesterol (CHOL) less than 160  Goal:  Triglycerides (TRIG) less than 150  Goal:  HDL greater than 40  Goal:  LDL (LDLCALC) less than 100   BLOOD PRESSURE American Stroke Association blood pressure target is less that 120/80 mm/Hg  Your discharge blood pressure is:  BP: 209/122 mmHg  Monitor your blood pressure  Limit your salt and alcohol intake  Many individuals will require more than one medication for high blood pressure  DIABETES (A1c is a blood sugar average for last 3 months) Goal HGBA1c is under 7% (HBGA1c is blood sugar average for last 3 months)  Diabetes: {STROKE DC DIABETES:22357}    No results found for this basename: HGBA1C     Your HGBA1c can be lowered with medications, healthy diet, and exercise.  Check your blood sugar as directed by your physician  Call your physician if you experience unexplained or low blood sugars.  PHYSICAL ACTIVITY/REHABILITATION Goal is 30 minutes at least 4 days per week    {STROKE DC ACTIVITY/REHAB:22359}  Activity decreases your risk of heart attack and stroke and makes your heart stronger.  It  helps control your weight and blood pressure; helps you relax and can improve your mood.  Participate in a regular exercise program.  Talk with your doctor about the best form of exercise for you (dancing, walking, swimming, cycling).  DIET/WEIGHT Goal is to maintain a healthy weight  Your discharge diet is: Dysphagia *** liquids Your height is:  Height: 5\' 1"  (154.9 cm) Your current weight is: Weight: 68 kg (149 lb 14.6 oz) Your Body Mass Index (BMI) is:  BMI (Calculated): 28.4   Following the type of diet specifically designed for you will help prevent another stroke.  Your goal weight range is:  ***  Your goal Body Mass Index (BMI) is 19-24.  Healthy food habits can help reduce 3 risk factors for stroke:  High cholesterol, hypertension, and excess weight.  RESOURCES Stroke/Support Group:  Call [phone]  they meet the 3rd Sunday of the month on the Rehab Unit at [facility], [place] ( no meetings June, July & Aug).  STROKE EDUCATION PROVIDED/REVIEWED AND GIVEN TO PATIENT Stroke warning signs and symptoms How to activate emergency medical system (call 911). Medications prescribed at discharge. Need for follow-up after discharge. Personal risk factors for stroke. Pneumonia vaccine given:   {STROKE DC YES/NO/DATE:22363} Flu vaccine given:   {STROKE DC YES/NO/DATE:22363} My questions have been answered, the writing is legible, and I understand these instructions.  I will adhere to these goals & educational materials that have been provided to me  after my discharge from the hospital.

## 2012-04-27 NOTE — Progress Notes (Signed)
Physical Therapy Treatment Patient Details Name: [name] MRN: [id number] DOB: [birth year] Today's Date: 04/27/2012 Time: [id number] PT Time Calculation (min): 18 min  PT Assessment / Plan / Recommendation Comments on Treatment Session  Patient showing much improvement with ambulation and overall cognitive awareness. Patient more alert and talkative. Agreeable to do what she needs to do before in order to DC home safely. Patient smiliing looking out window upon entering and when leaving patient very appriciative.     Follow Up Recommendations  No PT follow up;Supervision/Assistance - 24 hour    Barriers to Discharge        Equipment Recommendations  Rolling walker with 5" wheels    Recommendations for Other Services    Frequency Min 3X/week   Plan Discharge plan remains appropriate;Frequency remains appropriate    Precautions / Restrictions Precautions Precautions: Fall   Pertinent Vitals/Pain     Mobility  Bed Mobility Bed Mobility: Not assessed Transfers Stand to Sit: 6: Modified independent (Device/Increase time) Stand Pivot Transfers: 6: Modified independent (Device/Increase time) Ambulation/Gait Ambulation/Gait Assistance: 5: Supervision Ambulation Distance (Feet): 250 Feet Assistive device: Rolling walker Ambulation/Gait Assistance Details: Cues for safety and positioning of RW Gait Pattern: Step-through pattern;Decreased stride length;Trunk flexed Gait velocity: decreased Stairs: Yes Stairs Assistance: 4: Min assist Stairs Assistance Details (indicate cue type and reason): A for balance and safety Stair Management Technique: One rail Right Number of Stairs: 10     Exercises     PT Diagnosis:    PT Problem List:   PT Treatment Interventions:     PT Goals Acute Rehab PT Goals PT Goal: Sit to Stand - Progress: Met PT Goal: Stand to Sit - Progress: Met PT Transfer Goal: Bed to Chair/Chair to Bed - Progress: Met PT Goal: Stand - Progress: Met PT Goal:  Ambulate - Progress: Met Pt will Go Up / Down Stairs: 3-5 stairs;with supervision;with rail(s) PT Goal: Up/Down Stairs - Progress: Goal set today  Visit Information  Last PT Received On: 04/27/12 Assistance Needed: +1    Subjective Data      Cognition  Overall Cognitive Status: Impaired Area of Impairment: Safety/judgement Arousal/Alertness: Awake/alert Orientation Level: Appears intact for tasks assessed Behavior During Session: [facility] for tasks performed Safety/Judgement: Decreased awareness of need for assistance    Balance     End of Session PT - End of Session Equipment Utilized During Treatment: Gait belt Activity Tolerance: Patient tolerated treatment well Patient left: in chair Nurse Communication: Mobility status   GP     Jacqualyn Posey 04/27/2012, 12:18 PM 04/27/2012 Jacqualyn Posey PTA [phone] pager [phone] office

## 2012-04-27 NOTE — Progress Notes (Signed)
Seen and agreed 04/27/2012 Jacqualyn Posey PTA [phone] pager [phone] office

## 2012-04-27 NOTE — Progress Notes (Signed)
Pt ambulated 2 times this evening with her sitter around the unit. Pt had soft medium sized BM this AM as well.  Appetite seems to have improved compared to previous RN's reports also.  Pt states that she is motivated to "get out of here"

## 2012-04-30 ENCOUNTER — Emergency Department (HOSPITAL_COMMUNITY): Payer: No Typology Code available for payment source

## 2012-04-30 ENCOUNTER — Inpatient Hospital Stay (HOSPITAL_COMMUNITY)
Admission: EM | Admit: 2012-04-30 | Discharge: 2012-05-03 | DRG: 308 | Disposition: A | Discharge status: Home Health | Payer: No Typology Code available for payment source | Attending: Internal Medicine | Admitting: Internal Medicine

## 2012-04-30 ENCOUNTER — Encounter (HOSPITAL_COMMUNITY): Payer: Self-pay

## 2012-04-30 DIAGNOSIS — E876 Hypokalemia: Secondary | ICD-10-CM | POA: Diagnosis present

## 2012-04-30 DIAGNOSIS — I2510 Atherosclerotic heart disease of native coronary artery without angina pectoris: Secondary | ICD-10-CM

## 2012-04-30 DIAGNOSIS — I701 Atherosclerosis of renal artery: Secondary | ICD-10-CM | POA: Diagnosis present

## 2012-04-30 DIAGNOSIS — K5900 Constipation, unspecified: Secondary | ICD-10-CM | POA: Diagnosis present

## 2012-04-30 DIAGNOSIS — I10 Essential (primary) hypertension: Secondary | ICD-10-CM

## 2012-04-30 DIAGNOSIS — I509 Heart failure, unspecified: Secondary | ICD-10-CM | POA: Diagnosis present

## 2012-04-30 DIAGNOSIS — I5032 Chronic diastolic (congestive) heart failure: Secondary | ICD-10-CM | POA: Diagnosis present

## 2012-04-30 DIAGNOSIS — I4891 Unspecified atrial fibrillation: Principal | ICD-10-CM

## 2012-04-30 DIAGNOSIS — E46 Unspecified protein-calorie malnutrition: Secondary | ICD-10-CM

## 2012-04-30 DIAGNOSIS — I609 Nontraumatic subarachnoid hemorrhage, unspecified: Secondary | ICD-10-CM | POA: Diagnosis present

## 2012-04-30 DIAGNOSIS — N189 Chronic kidney disease, unspecified: Secondary | ICD-10-CM

## 2012-04-30 DIAGNOSIS — I5033 Acute on chronic diastolic (congestive) heart failure: Secondary | ICD-10-CM | POA: Diagnosis present

## 2012-04-30 DIAGNOSIS — Z8679 Personal history of other diseases of the circulatory system: Secondary | ICD-10-CM | POA: Diagnosis present

## 2012-04-30 DIAGNOSIS — I252 Old myocardial infarction: Secondary | ICD-10-CM

## 2012-04-30 DIAGNOSIS — Z982 Presence of cerebrospinal fluid drainage device: Secondary | ICD-10-CM

## 2012-04-30 DIAGNOSIS — F17200 Nicotine dependence, unspecified, uncomplicated: Secondary | ICD-10-CM | POA: Diagnosis present

## 2012-04-30 DIAGNOSIS — I6200 Nontraumatic subdural hemorrhage, unspecified: Secondary | ICD-10-CM

## 2012-04-30 DIAGNOSIS — E872 Acidosis: Secondary | ICD-10-CM

## 2012-04-30 LAB — CBC
HCT: 38.2 % (ref 36.0–46.0)
Hemoglobin: 13 g/dL (ref 12.0–15.0)
MCV: 84.3 fL (ref 78.0–100.0)
MCV: 84.7 fL (ref 78.0–100.0)
Platelets: 328 10*3/uL (ref 150–400)
RBC: 4.53 MIL/uL (ref 3.87–5.11)
RBC: 4.45 MIL/uL (ref 3.87–5.11)
RDW: 14.1 % (ref 11.5–15.5)
RDW: 14.1 % (ref 11.5–15.5)
WBC: 10.2 10*3/uL (ref 4.0–10.5)
WBC: 9.6 10*3/uL (ref 4.0–10.5)

## 2012-04-30 LAB — BASIC METABOLIC PANEL
CO2: 21 mEq/L (ref 19–32)
Chloride: 109 mEq/L (ref 96–112)
GFR calc Af Amer: 57 mL/min — ABNORMAL LOW (ref >90)
Potassium: 3 mEq/L — ABNORMAL LOW (ref 3.5–5.1)
Sodium: 145 mEq/L (ref 135–145)

## 2012-04-30 LAB — CARDIAC PANEL(CRET KIN+CKTOT+MB+TROPI)
CK, MB: 2.5 ng/mL (ref 0.3–4.0)
CK, MB: 2.6 ng/mL (ref 0.3–4.0)
CK, MB: 2.4 ng/mL (ref 0.3–4.0)
Relative Index: INVALID (ref 0.0–2.5)
Total CK: 69 U/L (ref 7–177)
Troponin I: <0.3 ng/mL (ref <0.30)
Troponin I: <0.3 ng/mL (ref <0.30)

## 2012-04-30 LAB — URINALYSIS, ROUTINE W REFLEX MICROSCOPIC
Bilirubin Urine: NEGATIVE
Glucose, UA: NEGATIVE mg/dL
Hgb urine dipstick: NEGATIVE
Specific Gravity, Urine: 1.009 (ref 1.005–1.030)
pH: 6.5 (ref 5.0–8.0)

## 2012-04-30 LAB — POCT I-STAT TROPONIN I

## 2012-04-30 LAB — POCT I-STAT, CHEM 8
BUN: 6 mg/dL (ref 6–23)
Chloride: 113 mEq/L — ABNORMAL HIGH (ref 96–112)
Creatinine, Ser: 1.7 mg/dL — ABNORMAL HIGH (ref 0.50–1.10)
Glucose, Bld: 146 mg/dL — ABNORMAL HIGH (ref 70–99)
Potassium: 2.9 mEq/L — ABNORMAL LOW (ref 3.5–5.1)
Sodium: 148 mEq/L — ABNORMAL HIGH (ref 135–145)

## 2012-04-30 LAB — MRSA PCR SCREENING: MRSA by PCR: POSITIVE — AB

## 2012-04-30 LAB — MAGNESIUM: Magnesium: 1.2 mg/dL — ABNORMAL LOW (ref 1.5–2.5)

## 2012-04-30 LAB — TSH: TSH: 0.257 u[IU]/mL — ABNORMAL LOW (ref 0.350–4.500)

## 2012-04-30 MED ORDER — DEXTROSE 5 % IV SOLN
5.0000 mg/h | INTRAVENOUS | Status: DC
  Administered 2012-04-30: 10 mg/h via INTRAVENOUS
  Administered 2012-04-30: 20 mg/h via INTRAVENOUS
  Administered 2012-04-30: 10 mg/h via INTRAVENOUS
  Administered 2012-04-30 – 2012-05-02 (×3): 5 mg/h via INTRAVENOUS

## 2012-04-30 MED ORDER — MORPHINE SULFATE 2 MG/ML IJ SOLN
2.0000 mg | INTRAMUSCULAR | Status: DC | PRN
  Administered 2012-04-30: 2 mg via INTRAVENOUS
  Filled 2012-04-30: qty 1

## 2012-04-30 MED ORDER — PANTOPRAZOLE SODIUM 40 MG PO TBEC
40.0000 mg | DELAYED_RELEASE_TABLET | Freq: Every day | ORAL | Status: DC
  Administered 2012-04-30 – 2012-05-03 (×4): 40 mg via ORAL
  Filled 2012-04-30 (×4): qty 1

## 2012-04-30 MED ORDER — ASPIRIN EC 81 MG PO TBEC
81.0000 mg | DELAYED_RELEASE_TABLET | Freq: Every day | ORAL | Status: DC
  Administered 2012-04-30 – 2012-05-03 (×4): 81 mg via ORAL
  Filled 2012-04-30 (×4): qty 1

## 2012-04-30 MED ORDER — LEVETIRACETAM 500 MG PO TABS
500.0000 mg | ORAL_TABLET | Freq: Two times a day (BID) | ORAL | Status: DC
  Administered 2012-04-30 – 2012-05-03 (×7): 500 mg via ORAL
  Filled 2012-04-30 (×8): qty 1

## 2012-04-30 MED ORDER — SODIUM CHLORIDE 0.9 % IJ SOLN
3.0000 mL | Freq: Two times a day (BID) | INTRAMUSCULAR | Status: DC
  Administered 2012-05-01 – 2012-05-02 (×2): 3 mL via INTRAVENOUS

## 2012-04-30 MED ORDER — LABETALOL HCL 200 MG PO TABS
400.0000 mg | ORAL_TABLET | Freq: Three times a day (TID) | ORAL | Status: DC
  Administered 2012-04-30: 400 mg via ORAL
  Filled 2012-04-30 (×3): qty 2

## 2012-04-30 MED ORDER — SODIUM CHLORIDE 0.9 % IJ SOLN
3.0000 mL | Freq: Two times a day (BID) | INTRAMUSCULAR | Status: DC
  Administered 2012-05-02: 3 mL via INTRAVENOUS

## 2012-04-30 MED ORDER — METOCLOPRAMIDE HCL 10 MG PO TABS: 10.0000 mg | ORAL_TABLET | Freq: Three times a day (TID) | ORAL | Status: DC | PRN

## 2012-04-30 MED ORDER — POTASSIUM CHLORIDE 10 MEQ/100ML IV SOLN
10.0000 meq | INTRAVENOUS | Status: AC
  Administered 2012-04-30 (×3): 10 meq via INTRAVENOUS
  Filled 2012-04-30 (×2): qty 100
  Filled 2012-04-30: qty 200
  Filled 2012-04-30: qty 100

## 2012-04-30 MED ORDER — POLYETHYLENE GLYCOL 3350 17 G PO PACK
34.0000 g | PACK | Freq: Every day | ORAL | Status: DC
  Administered 2012-04-30 – 2012-05-02 (×3): 34 g via ORAL
  Filled 2012-04-30 (×4): qty 2

## 2012-04-30 MED ORDER — GI COCKTAIL ~~LOC~~: 30.0000 mL | Freq: Once | ORAL | Status: DC

## 2012-04-30 MED ORDER — ONDANSETRON HCL 4 MG/2ML IJ SOLN
4.0000 mg | Freq: Four times a day (QID) | INTRAMUSCULAR | Status: DC | PRN
  Administered 2012-04-30 (×2): 4 mg via INTRAVENOUS
  Filled 2012-04-30 (×3): qty 2

## 2012-04-30 MED ORDER — ONDANSETRON HCL 4 MG PO TABS: 4.0000 mg | ORAL_TABLET | Freq: Four times a day (QID) | ORAL | Status: DC | PRN

## 2012-04-30 MED ORDER — POTASSIUM CHLORIDE 10 MEQ/100ML IV SOLN
10.0000 meq | INTRAVENOUS | Status: AC
  Administered 2012-04-30 (×2): 10 meq via INTRAVENOUS

## 2012-04-30 MED ORDER — NITROGLYCERIN 0.4 MG SL SUBL: 0.4000 mg | SUBLINGUAL_TABLET | SUBLINGUAL | Status: DC | PRN

## 2012-04-30 MED ORDER — SODIUM CHLORIDE 0.9 % IV SOLN: INTRAVENOUS | Status: DC

## 2012-04-30 MED ORDER — AMLODIPINE BESYLATE 10 MG PO TABS
10.0000 mg | ORAL_TABLET | Freq: Every day | ORAL | Status: DC
  Administered 2012-04-30: 10 mg via ORAL
  Filled 2012-04-30 (×2): qty 1

## 2012-04-30 MED ORDER — LOSARTAN POTASSIUM 25 MG PO TABS
25.0000 mg | ORAL_TABLET | Freq: Two times a day (BID) | ORAL | Status: DC
  Administered 2012-04-30 – 2012-05-01 (×3): 25 mg via ORAL
  Filled 2012-04-30 (×4): qty 1

## 2012-04-30 MED ORDER — METOPROLOL TARTRATE 25 MG PO TABS
25.0000 mg | ORAL_TABLET | Freq: Four times a day (QID) | ORAL | Status: DC
  Administered 2012-04-30 (×2): 25 mg via ORAL
  Filled 2012-04-30 (×6): qty 1

## 2012-04-30 MED ORDER — ZOLPIDEM TARTRATE 5 MG PO TABS: 5.0000 mg | ORAL_TABLET | Freq: Every evening | ORAL | Status: DC | PRN

## 2012-04-30 MED ORDER — SODIUM CHLORIDE 0.9 % IJ SOLN
3.0000 mL | INTRAMUSCULAR | Status: DC | PRN
  Administered 2012-05-02: 3 mL via INTRAVENOUS

## 2012-04-30 MED ORDER — SODIUM CHLORIDE 0.9 % IV SOLN: 250.0000 mL | INTRAVENOUS | Status: DC | PRN

## 2012-04-30 NOTE — ED Notes (Signed)
Pt with d/c from hospital on July 9th for brain aneurysm.  Coiling placed.  3 additional brain bleeds noted.  Pt with hx cad and now c/o chest pain.  Headache x last 3 days since d/c.  Worsening today with vomiting.  Unable to take meds.

## 2012-04-30 NOTE — Consult Note (Signed)
Patient ID: [name] MRN: [id number], DOB/AGE: 1961-04-21   Admit date: 04/30/2012 Date of Consult: @TODAY @  Primary Physician: Primary Cardiologist: D.McLean    Problem List: Past Medical History  Diagnosis Date  . HTN (hypertension)     resistant  . Hypercholesterolemia   . Tobacco abuse     resumed smoking half a pack a day. She was a smoker in the past and states she was abl eto stop in the past using nicotine patches  . CAD (coronary artery disease)     Pt has St elevation MI in Feb 2009. Left hear tcath at tha ttime showed a 70% distal LAD lesion that was hazy consistent with a plaque rupture. She had a 50% distal circumflex stenosis an a 95% distal RCA stenosis. She did havve drug eluting stent placed in her distal RCA  . Chronic kidney disease     most recent creatinine 1.4 in 3/10  . Intracranial hemorrhage, spontaneous intraparenchymal, idiopathic, remote, resolved   . Diastolic heart failure     pt most recent echo was in Feb 2009 w an EF of 60% and severe left ventricular hypertrophy. The pt did have evidence of diastolic dysfunction. The RV appeared normal  . Subarachnoid hemorrhage     03/2012    Past Surgical History  Procedure Date  . Ventriculoperitoneal shunt 03/27/2012    Procedure: SHUNT INSERTION VENTRICULAR-PERITONEAL;  Surgeon: Winfield Cunas, MD;  Location: [facility];  Service: Neurosurgery;  Laterality: N/A;  Ventricular-Peritoneal Shunt Insertion  . Cardiac catheterization   . Coronary angioplasty   . Abdominal hysterectomy      Allergies:  Allergies  Allergen Reactions  . Ampicillin Other (See Comments)    unknown  . Penicillins Other (See Comments)    unknown    HPI:  Patient is a 51 year old who we are asked to see re atrial fibrillation  The patient has a history of CAD (NSTEMI 2/09; s/p Promus stent to RCA.  Resid 70% distal LAD  Lexiscan myoview 9/12 no ischemia;, HTN, diastolic CHF, CRI HL, ICH (spontaneous).  Patient also has a  history of noncompliance in the past. Presents to ER with CP and palpitations .  Also complains of N/V.    Patient says this AM she developed CP, SOB with heart racing.  Felt dizzy but did not pass out.  Has not had before.    Before today no CP. Patient has had N/V since d/c'd from hospital.  Difficult keeping anything down. SHe did not have prob with this (N/V, constipation until after this discharge.  Last admit in June  Prior to that admit patient found to have Bivalve.  Underwent coiling and VP shunt.  BP difficult to control.     Inpatient Medications:    . amLODipine  10 mg Oral Daily  . labetalol  400 mg Oral TID  . levETIRAcetam  500 mg Oral BID  . losartan  25 mg Oral BID  . pantoprazole  40 mg Oral Q1200  . polyethylene glycol  34 g Oral Daily  . potassium chloride  10 mEq Intravenous Q1 Hr x 5  . sodium chloride  3 mL Intravenous Q12H  . sodium chloride  3 mL Intravenous Q12H  . DISCONTD: gi cocktail  30 mL Oral Once    Family History  Problem Relation Age of Onset  . Coronary artery disease Other     premature in 1st degree relatives     History   Social History  .  Marital Status: Single    Spouse Name: N/A    Number of Children: N/A  . Years of Education: N/A   Occupational History  . Not on file.   Social History Main Topics  . Smoking status: Current Everyday Smoker -- 0.5 packs/day for 35 years  . Smokeless tobacco: Never Used   Comment: 1/2 ppd   . Alcohol Use: No  . Drug Use: No  . Sexually Active: Not on file   Other Topics Concern  . Not on file   Social History Narrative   Lives by herself but helps care for her 8 grandchildren.      Review of Systems: General: negative for chills, fever, night sweats Cardiovascular: negative for , dyspnea on exertion, edema, orthopnea, ,edema. Dermatological: negative for rash Respiratory:Dry cough Urologic: negative for hematuria Abdominal: As noted above. NeurologicAs mpted above. All other  systems reviewed and are otherwise negative except as noted above. Has not smoked since d/c from hospital.  Physical Exam: Filed Vitals:   04/30/12 1341  BP: 131/80  Pulse:   Temp:   Resp: 20    Intake/Output Summary (Last 24 hours) at 04/30/12 1407 Last data filed at 04/30/12 0214  Gross per 24 hour  Intake      0 ml  Output    250 ml  Net   -250 ml   Tele:  Currently HR 70s to 80s (fib. General: Well developed, well nourished, who complains of nausea. Head: Normocephalic, atraumatic, sclera non-icteric Neck: Negative for carotid bruits. JVP not elevated. Lungs: Clear bilaterally to auscultation No rales  Occasional wheeze. Heart: Irreg rate/rhythm.  with S1 S2. No murmurs, rubs, or gallops appreciated. Abdomen: Soft, non-tender, non-distended with normoactive bowel sounds. No hepatomegaly. No rebound/guarding. No obvious abdominal masses. Msk:  Strength and tone appears normal for age. Extremities: No clubbing, cyanosis or edema.  Distal pedal pulses are 2+ and equal bilaterally. Neuro: Alert and oriented X 3. Moves all extremities spontaneously. Psych:  Responds to questions appropriately with a normal affect.  Labs: Results for orders placed during the hospital encounter of 04/30/12 (from the past 24 hour(s))  CBC     Status: Normal   Collection Time   04/30/12  1:35 AM      Component Value Range   WBC 10.2  4.0 - 10.5 K/uL   RBC 4.53  3.87 - 5.11 MIL/uL   Hemoglobin 13.0  12.0 - 15.0 g/dL   HCT 38.2  36.0 - 46.0 %   MCV 84.3  78.0 - 100.0 fL   MCH 28.7  26.0 - 34.0 pg   MCHC 34.0  30.0 - 36.0 g/dL   RDW 14.1  11.5 - 15.5 %   Platelets 325  150 - 400 K/uL  POCT I-STAT TROPONIN I     Status: Normal   Collection Time   04/30/12  1:47 AM      Component Value Range   Troponin i, poc 0.02  0.00 - 0.08 ng/mL   Comment 3           POCT I-STAT, CHEM 8     Status: Abnormal   Collection Time   04/30/12  1:49 AM      Component Value Range   Sodium 148 (*) 135 - 145 mEq/L    Potassium 2.9 (*) 3.5 - 5.1 mEq/L   Chloride 113 (*) 96 - 112 mEq/L   BUN 6  6 - 23 mg/dL   Creatinine, Ser 1.70 (*) 0.50 - 1.10  mg/dL   Glucose, Bld 146 (*) 70 - 99 mg/dL   Calcium, Ion 1.14  1.12 - 1.23 mmol/L   TCO2 18  0 - 100 mmol/L   Hemoglobin 13.9  12.0 - 15.0 g/dL   HCT 41.0  36.0 - 46.0 %  URINALYSIS, ROUTINE W REFLEX MICROSCOPIC     Status: Normal   Collection Time   04/30/12  2:05 AM      Component Value Range   Color, Urine YELLOW  YELLOW   APPearance CLEAR  CLEAR   Specific Gravity, Urine 1.009  1.005 - 1.030   pH 6.5  5.0 - 8.0   Glucose, UA NEGATIVE  NEGATIVE mg/dL   Hgb urine dipstick NEGATIVE  NEGATIVE   Bilirubin Urine NEGATIVE  NEGATIVE   Ketones, ur NEGATIVE  NEGATIVE mg/dL   Protein, ur NEGATIVE  NEGATIVE mg/dL   Urobilinogen, UA 0.2  0.0 - 1.0 mg/dL   Nitrite NEGATIVE  NEGATIVE   Leukocytes, UA NEGATIVE  NEGATIVE  CARDIAC PANEL(CRET KIN+CKTOT+MB+TROPI)     Status: Normal   Collection Time   04/30/12  4:02 AM      Component Value Range   Total CK 69  7 - 177 U/L   CK, MB 2.5  0.3 - 4.0 ng/mL   Troponin I <0.30  <0.30 ng/mL   Relative Index RELATIVE INDEX IS INVALID  0.0 - 2.5  CBC     Status: Normal   Collection Time   04/30/12  6:30 AM      Component Value Range   WBC 9.6  4.0 - 10.5 K/uL   RBC 4.45  3.87 - 5.11 MIL/uL   Hemoglobin 12.5  12.0 - 15.0 g/dL   HCT 37.7  36.0 - 46.0 %   MCV 84.7  78.0 - 100.0 fL   MCH 28.1  26.0 - 34.0 pg   MCHC 33.2  30.0 - 36.0 g/dL   RDW 14.1  11.5 - 15.5 %   Platelets 328  150 - 400 K/uL  BASIC METABOLIC PANEL     Status: Abnormal   Collection Time   04/30/12  6:30 AM      Component Value Range   Sodium 145  135 - 145 mEq/L   Potassium 3.0 (*) 3.5 - 5.1 mEq/L   Chloride 109  96 - 112 mEq/L   CO2 21  19 - 32 mEq/L   Glucose, Bld 108 (*) 70 - 99 mg/dL   BUN 8  6 - 23 mg/dL   Creatinine, Ser 1.25 (*) 0.50 - 1.10 mg/dL   Calcium 9.3  8.4 - 10.5 mg/dL   GFR calc non Af Amer 49 (*) >90 mL/min   GFR calc Af  Amer 57 (*) >90 mL/min  CARDIAC PANEL(CRET KIN+CKTOT+MB+TROPI)     Status: Normal   Collection Time   04/30/12 12:10 PM      Component Value Range   Total CK 65  7 - 177 U/L   CK, MB 2.6  0.3 - 4.0 ng/mL   Troponin I <0.30  <0.30 ng/mL   Relative Index RELATIVE INDEX IS INVALID  0.0 - 2.5  MAGNESIUM     Status: Abnormal   Collection Time   04/30/12 12:10 PM      Component Value Range   Magnesium 1.2 (*) 1.5 - 2.5 mg/dL    Radiology/Studies: Ct Head Wo Contrast    04/30/2012  *RADIOLOGY REPORT*  Clinical Data: Headache, chest pain, vomiting and tachycardia.  PORTABLE CHEST - 1  VIEW  Comparison: Chest radiograph performed 62,013  Findings: A ventriculoperitoneal shunt is again noted along the right side of the chest.  The lungs are relatively well expanded.  Vascular congestion is noted, with mildly increased interstitial markings, possibly reflecting minimal interstitial edema.  No pleural effusion or pneumothorax is seen.  The cardiomediastinal silhouette is borderline enlarged.  No acute osseous abnormalities are identified.  IMPRESSION: Vascular congestion and borderline cardiomegaly, with mildly increased interstitial markings; this could reflect minimal interstitial edema.  Original Report Authenticated By: Santa Lighter, M.D.      EKG:  Atrial fibrillation 200 bpm.  LVH with repolarizaion abnormalitiy.  ASSESSMENT AND PLAN:   Patient is a 51 year old with a history of CAD, CV disease, HTN, HL.   Presents with days of N/V  Today developed CP and palpitations.  Found to be in afib with RVR.  Now controlled  On IV diltiazem  Currently, rates are controlled.  I would continue for now.   Replete KCL With her history of n/v and also constipation dilt is probably not the best med. Could exacerbate theses problems.  WOuld move to higher doses of  b blocker on stabilizes and demonstrates can take PO.  Patient on labetatlol.  I would move to cardiac slective. And increase dose as HR,/BP  tolerates.  With her neuro history I do not think she is a canddate for anticoagulation.  Just had echo in May.  LVEF was 55 to 60^  LA was mildly dilated at 44 mm.   . 2.  CAD.  EKG is nondiagnostic due to LVH.  WOuld follow, check enzymes.  I am not convinced of ischemia however.  May just be symptomatic given extremely high HR. Use ASA.  3.  HL  NOt on meds this moment Resume lipitor 80  4.  CRI.  FOllow.    5.  ICH  Follow.     Signed, Dorris Carnes 04/30/2012, 2:07 PM

## 2012-04-30 NOTE — ED Notes (Addendum)
NS 1037ml bag hung as dilutant for potassium infusion. Infusing approx 215ml/hr due to pt c/o burning in vein by potassium. Verbal order given by Jeannene Patella MD that this is acceptable during K+ infusion and to watch pt's I&O. NS also infusing TKO with cardizem gtt.

## 2012-04-30 NOTE — ED Notes (Signed)
Pt reported not wanting to try to eat breakfast anymore, resting in bed. Awaiting bed assignment.

## 2012-04-30 NOTE — ED Notes (Signed)
Lab called to get 5am labs due to multiple attempts. State that they will be down after morning collection

## 2012-04-30 NOTE — ED Provider Notes (Signed)
History     CSN: [id number]  Arrival date & time 04/30/12  0107   First MD Initiated Contact with Patient 04/30/12 0127      Chief Complaint  Patient presents with  . Headache  . Chest Pain  . Emesis    (Consider location/radiation/quality/duration/timing/severity/associated sxs/prior treatment) HPI History provided by patient. Chest pain and palpitations started tonight while at rest. Symptoms persistent and presents here for evaluation. He should also states she has been nauseous with vomiting and unable to take her medications. She had recent prolonged hospitalization for hypertension and brain aneurysm with coiling. She currently does not have any chest pain, or when present was left-sided and sharp. No shortness of breath. Not feeling well her last few days with some intermittent headaches. History of rapid atrial fibrillation, heart failure, and coronary artery disease followed by [facility]. Symptoms moderate to severe. No known aggravating or alleviating factors. Past Medical History  Diagnosis Date  . HTN (hypertension)     resistant  . Hypercholesterolemia   . Tobacco abuse     resumed smoking half a pack a day. She was a smoker in the past and states she was abl eto stop in the past using nicotine patches  . CAD (coronary artery disease)     Pt has St elevation MI in Feb 2009. Left hear tcath at tha ttime showed a 70% distal LAD lesion that was hazy consistent with a plaque rupture. She had a 50% distal circumflex stenosis an a 95% distal RCA stenosis. She did havve drug eluting stent placed in her distal RCA  . Chronic kidney disease     most recent creatinine 1.4 in 3/10  . Intracranial hemorrhage, spontaneous intraparenchymal, idiopathic, remote, resolved   . Diastolic heart failure     pt most recent echo was in Feb 2009 w an EF of 60% and severe left ventricular hypertrophy. The pt did have evidence of diastolic dysfunction. The RV appeared normal  .  Subarachnoid hemorrhage     03/2012    Past Surgical History  Procedure Date  . Ventriculoperitoneal shunt 03/27/2012    Procedure: SHUNT INSERTION VENTRICULAR-PERITONEAL;  Surgeon: Winfield Cunas, MD;  Location: [facility];  Service: Neurosurgery;  Laterality: N/A;  Ventricular-Peritoneal Shunt Insertion  . Cardiac catheterization   . Coronary angioplasty     Family History  Problem Relation Age of Onset  . Coronary artery disease Other     premature in 1st degree relatives    History  Substance Use Topics  . Smoking status: Current Some Day Smoker  . Smokeless tobacco: Not on file   Comment: 1/2 ppd   . Alcohol Use: No    OB History    Grav Para Term Preterm Abortions TAB SAB Ect Mult Living                  Review of Systems  Constitutional: Negative for fever and chills.  HENT: Negative for neck pain and neck stiffness.   Eyes: Negative for pain.  Respiratory: Negative for cough.   Cardiovascular: Positive for palpitations.  Gastrointestinal: Negative for abdominal pain.  Genitourinary: Negative for dysuria.  Musculoskeletal: Negative for back pain.  Skin: Negative for rash.  Neurological: Negative for headaches.  All other systems reviewed and are negative.    Allergies  Ampicillin and Penicillins  Home Medications   Current Outpatient Rx  Name Route Sig Dispense Refill  . AMLODIPINE BESYLATE 10 MG PO TABS Oral Take 1 tablet (  10 mg total) by mouth daily. This is for blood pressure 30 tablet 0  . LABETALOL HCL 200 MG PO TABS Oral Take 2 tablets (400 mg total) by mouth 3 (three) times daily. 180 tablet 0  . LEVETIRACETAM 500 MG PO TABS Oral Take 1 tablet (500 mg total) by mouth 2 (two) times daily. 60 tablet 0  . LOSARTAN POTASSIUM 25 MG PO TABS Oral Take 1 tablet (25 mg total) by mouth 2 (two) times daily. 60 tablet 0  . METOCLOPRAMIDE HCL 10 MG PO TABS Oral Take 1 tablet (10 mg total) by mouth 3 (three) times daily as needed (nausea). 30 tablet 0  .  NITROGLYCERIN 0.4 MG SL SUBL Sublingual Place 1 tablet (0.4 mg total) under the tongue every 5 (five) minutes as needed for chest pain. For chest pain 10 tablet 0  . OMEPRAZOLE 20 MG PO CPDR Oral Take 1 capsule (20 mg total) by mouth daily.    Marland Kitchen POLYETHYLENE GLYCOL 3350 PO POWD Oral Take 34 g by mouth daily. 255 g 0    BP 201/153  Pulse 120  Temp 98.2 F (36.8 C) (Oral)  Resp 23  SpO2 100%  Physical Exam  Constitutional: She is oriented to person, place, and time. She appears well-developed and well-nourished.  HENT:  Head: Normocephalic and atraumatic.  Eyes: Conjunctivae and EOM are normal. Pupils are equal, round, and reactive to light.  Neck: Trachea normal. Neck supple. No thyromegaly present.  Cardiovascular: S1 normal, S2 normal and normal pulses.     No systolic murmur is present   No diastolic murmur is present  Pulses:      Radial pulses are 2+ on the right side, and 2+ on the left side.       Irregularly irregular  Pulmonary/Chest: Effort normal and breath sounds normal. She has no wheezes. She has no rhonchi. She has no rales. She exhibits no tenderness.  Abdominal: Soft. Normal appearance and bowel sounds are normal. There is no tenderness. There is no CVA tenderness and negative Murphy's sign.  Musculoskeletal:       BLE:s Calves nontender, no cords or erythema, negative Homans sign  Neurological: She is alert and oriented to person, place, and time. She has normal strength. No cranial nerve deficit or sensory deficit. GCS eye subscore is 4. GCS verbal subscore is 5. GCS motor subscore is 6.  Skin: Skin is warm and dry. No rash noted. She is not diaphoretic.  Psychiatric: Her speech is normal.       Cooperative and appropriate    ED Course  Procedures (including critical care time)  Results for orders placed during the hospital encounter of 04/30/12  CBC      Component Value Range   WBC 10.2  4.0 - 10.5 K/uL   RBC 4.53  3.87 - 5.11 MIL/uL   Hemoglobin 13.0   12.0 - 15.0 g/dL   HCT 38.2  36.0 - 46.0 %   MCV 84.3  78.0 - 100.0 fL   MCH 28.7  26.0 - 34.0 pg   MCHC 34.0  30.0 - 36.0 g/dL   RDW 14.1  11.5 - 15.5 %   Platelets 325  150 - 400 K/uL  URINALYSIS, ROUTINE W REFLEX MICROSCOPIC      Component Value Range   Color, Urine YELLOW  YELLOW   APPearance CLEAR  CLEAR   Specific Gravity, Urine 1.009  1.005 - 1.030   pH 6.5  5.0 - 8.0   Glucose, UA  NEGATIVE  NEGATIVE mg/dL   Hgb urine dipstick NEGATIVE  NEGATIVE   Bilirubin Urine NEGATIVE  NEGATIVE   Ketones, ur NEGATIVE  NEGATIVE mg/dL   Protein, ur NEGATIVE  NEGATIVE mg/dL   Urobilinogen, UA 0.2  0.0 - 1.0 mg/dL   Nitrite NEGATIVE  NEGATIVE   Leukocytes, UA NEGATIVE  NEGATIVE  POCT I-STAT, CHEM 8      Component Value Range   Sodium 148 (*) 135 - 145 mEq/L   Potassium 2.9 (*) 3.5 - 5.1 mEq/L   Chloride 113 (*) 96 - 112 mEq/L   BUN 6  6 - 23 mg/dL   Creatinine, Ser 1.70 (*) 0.50 - 1.10 mg/dL   Glucose, Bld 146 (*) 70 - 99 mg/dL   Calcium, Ion 1.14  1.12 - 1.23 mmol/L   TCO2 18  0 - 100 mmol/L   Hemoglobin 13.9  12.0 - 15.0 g/dL   HCT 41.0  36.0 - 46.0 %  POCT I-STAT TROPONIN I      Component Value Range   Troponin i, poc 0.02  0.00 - 0.08 ng/mL   Comment 3           CARDIAC PANEL(CRET KIN+CKTOT+MB+TROPI)      Component Value Range   Total CK PENDING  7 - 177 U/L   CK, MB 2.5  0.3 - 4.0 ng/mL   Troponin I <0.30  <0.30 ng/mL   Relative Index PENDING  0.0 - 2.5    Dg Chest Portable 1 View  04/30/2012  *RADIOLOGY REPORT*  Clinical Data: Headache, chest pain, vomiting and tachycardia.  PORTABLE CHEST - 1 VIEW  Comparison: Chest radiograph performed 62,013  Findings: A ventriculoperitoneal shunt is again noted along the right side of the chest.  The lungs are relatively well expanded.  Vascular congestion is noted, with mildly increased interstitial markings, possibly reflecting minimal interstitial edema.  No pleural effusion or pneumothorax is seen.  The cardiomediastinal silhouette  is borderline enlarged.  No acute osseous abnormalities are identified.  IMPRESSION: Vascular congestion and borderline cardiomegaly, with mildly increased interstitial markings; this could reflect minimal interstitial edema.  Original Report Authenticated By: Santa Lighter, M.D.   Dg Abd Portable 1v  04/25/2012  *RADIOLOGY REPORT*  Clinical Data: Nausea and vomiting.  PORTABLE ABDOMEN - 1 VIEW  Comparison: Portable abdomen x-ray 04/23/2012 dating back to 04/02/2012.  Findings: Bowel gas pattern unremarkable without evidence of obstruction or significant ileus.  Interval decrease in the bowel gas since the examination 2 days ago.  Persistent large stool burden in the cecum, ascending colon, and transverse colon.  No visible opaque urinary tract calculi.  Phleboliths in the right side of the pelvis.  Right-sided ventriculoperitoneal shunt catheter tubing with its tip in the right upper pelvis.  IMPRESSION: No acute abdominal abnormality.  Large stool burden in the cecum, ascending colon, and transverse colon as noted previously.  Original Report Authenticated By: Deniece Portela, M.D.    Date: 04/30/2012  Rate: 200  Rhythm: atrial fibrillation  QRS Axis: right  Intervals: normal  ST/T Wave abnormalities: nonspecific ST/T changes  Conduction Disutrbances:none  Narrative Interpretation: LVH, rapid afib, appears similar to previous ecg 03-12-12 rapid afib  Old EKG Reviewed: changes noted   CRITICAL CARE Performed by: Teressa Lower   Total critical care time: 45 Critical care time was exclusive of separately billable procedures and treating other patients.  Critical care was necessary to treat or prevent imminent or life-threatening deterioration.  Critical care was time spent personally by me  on the following activities: development of treatment plan with patient and/or surrogate as well as nursing, discussions with consultants, evaluation of patient's response to treatment, examination of  patient, obtaining history from patient or surrogate, ordering and performing treatments and interventions, ordering and review of laboratory studies, ordering and review of radiographic studies, pulse oximetry and re-evaluation of patient's condition. IV diltiazem drip and bolus initiated. Serial valuations with improving rate and blood pressure. Gentle IV fluid hydration given history of heart failure.  Case discussed as above with Dr. Aundra Dubin on call for cardiology. He is familiar with patient and her recent prolonged hospitalization. He feels it is appropriate for patient to be admitted at [facility] to the medicine service for diltiazem drip and rate control and hypertension control. Will consult as needed.  Case discussed with [facility]ad hospitalist on-call Dr. Marin Comment who agrees to evaluation for admission.  Old records reviewed. Imaging and labs obtained and reviewed as above. EKG as above.  MDM   Rapid atrial fibrillation requiring IV diltiazem drip. Also with elevated blood pressure and history of same with recent hospitalization for aneurysm/ coiling. Admit medicine - [facility] ICU.        Teressa Lower, MD 04/30/12 [phone]

## 2012-04-30 NOTE — H&P (Signed)
[facility] History and Physical  [name] [id number] DOB: Jul 28, 1961 DOA: 04/30/2012   PCP:   Richardson Dopp, PA   Chief Complaint: Palpitations found to be in atrial fibrillation with rapid ventricular rate.   HPI: [name] is an 51 y.o. female presents tonight to [facility] via EMS because of palpitations. She was found to be in atrial fibrillation with rapid ventricular rate which I believe to be new onset. She is well-known to the pulmonary critical care service and cardiology service because of a recent admission. and was found to have subarachnoid hemorrhage and cerebral angiogram showed 4 aneurysms of which one was called and patient had undergone VP shunt. Patient during the stay had been intubated twice. And her blood pressure was found to be difficult to be controlled. Patient was transferred from ICU to rehabilitation. Patient was getting feeds through Panda tube. The tube was discontinued  and patient has had poor total intake since then.  Her blood pressure was difficult to control the past. Evaluation in emergency room showed she has hypokalemia with potassium of 2.9, creatinine 1.7, and chest x-ray showed interstitial edema. Her EKG showed fibrillation with rapid ventricular rate. She was on IV Cardizem, and hospitalist was asked to admit her for new onset of atrial fibrillation with rapid ventricular rate.   Rewiew of Systems:  The patient denies and get last day or oh and will, fever, weight loss,, vision loss, decreased hearing, hoarseness, chest pain, syncope, dyspnea on exertion, peripheral edema, balance deficits, hemoptysis, abdominal pain, melena, hematochezia, severe indigestion/heartburn, hematuria, incontinence, genital sores, muscle weakness, suspicious skin lesions, transient blindness, difficulty walking, depression, unusual weight change, abnormal bleeding, enlarged lymph nodes, angioedema, and breast masses.    Past Medical History  Diagnosis Date    . HTN (hypertension)     resistant  . Hypercholesterolemia   . Tobacco abuse     resumed smoking half a pack a day. She was a smoker in the past and states she was abl eto stop in the past using nicotine patches  . CAD (coronary artery disease)     Pt has St elevation MI in Feb 2009. Left hear tcath at tha ttime showed a 70% distal LAD lesion that was hazy consistent with a plaque rupture. She had a 50% distal circumflex stenosis an a 95% distal RCA stenosis. She did havve drug eluting stent placed in her distal RCA  . Chronic kidney disease     most recent creatinine 1.4 in 3/10  . Intracranial hemorrhage, spontaneous intraparenchymal, idiopathic, remote, resolved   . Diastolic heart failure     pt most recent echo was in Feb 2009 w an EF of 60% and severe left ventricular hypertrophy. The pt did have evidence of diastolic dysfunction. The RV appeared normal  . Subarachnoid hemorrhage     03/2012    Past Surgical History  Procedure Date  . Ventriculoperitoneal shunt 03/27/2012    Procedure: SHUNT INSERTION VENTRICULAR-PERITONEAL;  Surgeon: Winfield Cunas, MD;  Location: [facility];  Service: Neurosurgery;  Laterality: N/A;  Ventricular-Peritoneal Shunt Insertion  . Cardiac catheterization   . Coronary angioplasty     Medications:  HOME MEDS: Prior to Admission medications   Medication Sig Start Date End Date Taking? Authorizing Provider  amLODipine (NORVASC) 10 MG tablet Take 1 tablet (10 mg total) by mouth daily. This is for blood pressure 04/27/12 04/27/13 Yes Sorin June Leap, MD  labetalol (NORMODYNE) 200 MG tablet Take 2 tablets (400 mg total) by  mouth 3 (three) times daily. 04/27/12 04/27/13 Yes Sorin June Leap, MD  levETIRAcetam (KEPPRA) 500 MG tablet Take 1 tablet (500 mg total) by mouth 2 (two) times daily. 04/27/12 04/27/13 Yes Sorin June Leap, MD  losartan (COZAAR) 25 MG tablet Take 1 tablet (25 mg total) by mouth 2 (two) times daily. 04/27/12 04/27/13 Yes Sorin June Leap, MD  metoCLOPramide  (REGLAN) 10 MG tablet Take 1 tablet (10 mg total) by mouth 3 (three) times daily as needed (nausea). 04/27/12 05/07/12 Yes Sorin June Leap, MD  nitroGLYCERIN (NITROSTAT) 0.4 MG SL tablet Place 1 tablet (0.4 mg total) under the tongue every 5 (five) minutes as needed for chest pain. For chest pain 04/27/12  Yes Sorin June Leap, MD  omeprazole (PRILOSEC) 20 MG capsule Take 1 capsule (20 mg total) by mouth daily. 04/27/12 04/27/13 Yes Sorin June Leap, MD  polyethylene glycol powder (GLYCOLAX/MIRALAX) powder Take 34 g by mouth daily. 04/27/12 04/30/12 Yes Sorin June Leap, MD     Allergies:  Allergies  Allergen Reactions  . Ampicillin Other (See Comments)    unknown  . Penicillins Other (See Comments)    unknown    Social History:   reports that she has been smoking.  She does not have any smokeless tobacco history on file. She reports that she does not drink alcohol or use illicit drugs.  Family History: Family History  Problem Relation Age of Onset  . Coronary artery disease Other     premature in 1st degree relatives     Physical Exam: Filed Vitals:   04/30/12 0545 04/30/12 0600 04/30/12 0630 04/30/12 0645  BP: 148/94 162/99 140/68 120/79  Pulse: 88 98 78 85  Temp:      TempSrc:      Resp: 27 20 19 20   SpO2: 99% 99% 99% 100%   Blood pressure 120/79, pulse 85, temperature 98.2 F (36.8 C), temperature source Oral, resp. rate 20, SpO2 100.00%.  GEN:  Pleasant  person lying in the stretcher in no acute distress; cooperative with exam PSYCH:  alert and oriented x4; does not appear anxious or depressed; affect is appropriate. HEENT: Mucous membranes pink and anicteric; PERRLA; EOM intact; no cervical lymphadenopathy nor thyromegaly or carotid bruit; no JVD; Breasts:: Not examined CHEST WALL: No tenderness CHEST: Normal respiration, with no wheezes or rales HEART: Rapid ventricular rate and irregular rhythm; no murmurs rubs or gallops BACK: No kyphosis or scoliosis; no CVA tenderness ABDOMEN: Obese,  soft non-tender; no masses, no organomegaly, normal abdominal bowel sounds; no pannus; no intertriginous candida. Rectal Exam: Not done EXTREMITIES: No bone or joint deformity; age-appropriate arthropathy of the hands and knees; no edema; no ulcerations. Genitalia: not examined PULSES: 2+ and symmetric SKIN: Normal hydration no rash or ulceration CNS: Cranial nerves 2-12 grossly intact no focal lateralizing neurologic deficit   Labs on Admission:  Basic Metabolic Panel:  Lab [id number] 04/27/12 0642 04/26/12 0500 04/25/12 0730 04/24/12 0630  NA 148* 145 141 143 138  K 2.9* 3.1* 3.4* 3.7 3.8  CL 113* 108 106 106 103  CO2 -- 22 23 23 22   GLUCOSE 146* 124* 92 96 101*  BUN 6 6 9 13 16   CREATININE 1.70* 1.27* 1.16* 1.35* 1.33*  CALCIUM -- 9.3 9.3 10.0 9.7  MG -- -- -- -- --  PHOS -- -- -- -- --   Liver Function Tests: CBC:  Lab 04/30/12 0149 04/30/12 0135  WBC -- 10.2  NEUTROABS -- --  HGB 13.9 13.0  HCT  41.0 38.2  MCV -- 84.3  PLT -- 325   Cardiac Enzymes:  Lab 04/30/12 0402  CKTOTAL 69  CKMB 2.5  CKMBINDEX --  TROPONINI <0.30    Radiological Exams on Admission: Dg Chest Portable 1 View  04/30/2012  *RADIOLOGY REPORT*  Clinical Data: Headache, chest pain, vomiting and tachycardia.  PORTABLE CHEST - 1 VIEW  Comparison: Chest radiograph performed 62,013  Findings: A ventriculoperitoneal shunt is again noted along the right side of the chest.  The lungs are relatively well expanded.  Vascular congestion is noted, with mildly increased interstitial markings, possibly reflecting minimal interstitial edema.  No pleural effusion or pneumothorax is seen.  The cardiomediastinal silhouette is borderline enlarged.  No acute osseous abnormalities are identified.  IMPRESSION: Vascular congestion and borderline cardiomegaly, with mildly increased interstitial markings; this could reflect minimal interstitial edema.  Original Report Authenticated By: Santa Lighter, M.D.        Assessment/Plan Present on Admission:  .DIASTOLIC HEART FAILURE, CHRONIC .SAH (subarachnoid hemorrhage) .CAD, NATIVE VESSEL .Renal artery stenosis .Protein calorie malnutrition .Constipation .New onset a-fib   PLAN: Will admit patient to step down for titration of Cardizem for her new onset of atrial fibrillation with rapid ventricular rate. Will obtain a cardiac echo. We can transition her IV to by mouth tomorrow. She is definitely not a candidate for anticoagulation given her recent subarachnoid hemorrhage. We'll continue her medication for her difficult to control hypertension. Her other medical problems are stable, and her medication will be continued. She is a full code, and will be admitted [facility]. She has refused to feeding although she has protein calorie malnutrition. Her last hospitalization, she had severe constipation, which could be secondary to medication. We'll continue her bowel regimen.   Other plans as per orders.  Code Status: Full code Family Communication: None Disposition Plan: SDU admit to [facility]    Jaydah Stahle Nocturnist [facility] Pager [phone]  04/30/2012, 6:55 AM

## 2012-04-30 NOTE — ED Notes (Signed)
Pt requesting medication for acid reflux. Protonix scheduled at 12p. Given at 1120 at pt request.

## 2012-04-30 NOTE — ED Notes (Signed)
Daughter called to get an update on pts status. Pt stated that it was ok for me to talk to daughter about her care. Daughter's contact info: [place] [phone] oldest daughter/caregiver.

## 2012-04-30 NOTE — ED Notes (Signed)
Pt resting in bed when care of pt assumed. Pt denies chest pain, sob. C/o nausea, medicated 2hrs ago with zofran. Offered choice between reglan tablet and waiting on breakfast tray as pt stated she was nauseous due to not eating and was scared to eat. Pt refused reglan and stated she would wait and try to eat some breakfast. Pt c/o tenderness at L hand IV site. Flushes well. Sts IV has been in 2 or 3 days, since Wednesday. Pt informed she had only been in hospital for 6-7 hrs and IV was placed on arrival. Pt verbalizes understanding. Per report, pt has been disoriented to time during admission. Denies other needs or complaints. Updated on bed status. Will continue to monitor.

## 2012-04-30 NOTE — ED Notes (Signed)
Per RN Rachell Cipro, TSH to be drawn with cardiac panel at 1947. Patient is a difficult stick.

## 2012-04-30 NOTE — Progress Notes (Signed)
Patient seen and examined by me.  Agree with plan by Dr. Marin Comment- see H&P.  Appreciate Cardiology's help.  Patient was just released from the hospital- will probably need SNF at D/C this visit.  Eulogio Bear

## 2012-04-30 NOTE — ED Notes (Signed)
Pt BP 152/110 and HR 120. Spoke with Dr. Marin Comment and he advised to increase Cardizem to 20. Pt also c/o of constipation and not being able to move her bowels along with nausea. Requested enema. Pt has Miralax in her orders to give and Zofran. Will continue to monitor pt and act on orders given.

## 2012-05-01 DIAGNOSIS — K5900 Constipation, unspecified: Secondary | ICD-10-CM

## 2012-05-01 DIAGNOSIS — I5032 Chronic diastolic (congestive) heart failure: Secondary | ICD-10-CM

## 2012-05-01 LAB — BASIC METABOLIC PANEL
Calcium: 8.6 mg/dL (ref 8.4–10.5)
GFR calc Af Amer: 70 mL/min — ABNORMAL LOW (ref >90)
GFR calc non Af Amer: 60 mL/min — ABNORMAL LOW (ref >90)
Potassium: 3.7 mEq/L (ref 3.5–5.1)
Sodium: 142 mEq/L (ref 135–145)

## 2012-05-01 MED ORDER — METOPROLOL TARTRATE 50 MG PO TABS
50.0000 mg | ORAL_TABLET | Freq: Two times a day (BID) | ORAL | Status: DC
  Administered 2012-05-01 (×2): 50 mg via ORAL
  Filled 2012-05-01 (×4): qty 1

## 2012-05-01 MED ORDER — HYDRALAZINE HCL 20 MG/ML IJ SOLN
10.0000 mg | Freq: Four times a day (QID) | INTRAMUSCULAR | Status: DC | PRN
  Administered 2012-05-01 – 2012-05-02 (×3): 10 mg via INTRAVENOUS
  Filled 2012-05-01 (×4): qty 1

## 2012-05-01 MED ORDER — DOCUSATE SODIUM 100 MG PO CAPS
100.0000 mg | ORAL_CAPSULE | Freq: Two times a day (BID) | ORAL | Status: DC
  Administered 2012-05-01 – 2012-05-03 (×5): 100 mg via ORAL
  Filled 2012-05-01 (×7): qty 1

## 2012-05-01 MED ORDER — LOSARTAN POTASSIUM 50 MG PO TABS
50.0000 mg | ORAL_TABLET | Freq: Two times a day (BID) | ORAL | Status: DC
  Administered 2012-05-01 – 2012-05-03 (×4): 50 mg via ORAL
  Filled 2012-05-01 (×5): qty 1

## 2012-05-01 NOTE — Progress Notes (Signed)
Patient ID: [name], female   DOB: 11/08/1960, 51 y.o.   MRN: [id number]    Subjective:  Denies SSCP, palpitations or Dyspnea Wants to go home Conveted to NSR last night  Objective:  Filed Vitals:   04/30/12 2021 05/01/12 0005 05/01/12 0400 05/01/12 0800  BP: 166/81 101/57    Pulse: 70 71    Temp:  98.4 F (36.9 C) 98.2 F (36.8 C) 98.7 F (37.1 C)  TempSrc:  Oral Oral Oral  Resp: 20 16    Height:      Weight:   65.3 kg (143 lb 15.4 oz)   SpO2: 100% 100%      Intake/Output from previous day:  Intake/Output Summary (Last 24 hours) at 05/01/12 0818 Last data filed at 05/01/12 0600  Gross per 24 hour  Intake   1300 ml  Output   1800 ml  Net   -500 ml    Physical Exam: Affect appropriate Chronically ill overweight black female HEENT: normal Neck supple with no adenopathy JVP normal no bruits no thyromegaly Lungs clear with no wheezing and good diaphragmatic motion Heart:  S1/S2 no murmur, no rub, gallop or click PMI normal Abdomen: benighn, BS positve, no tenderness, no AAA no bruit.  No HSM or HJR Distal pulses intact with no bruits No edema Neuro non-focal Skin warm and dry No muscular weakness   Lab Results: Basic Metabolic Panel:  Basename 04/30/12 1210 04/30/12 0630 04/30/12 0149  NA -- 145 148*  K -- 3.0* 2.9*  CL -- 109 113*  CO2 -- 21 --  GLUCOSE -- 108* 146*  BUN -- 8 6  CREATININE -- 1.25* 1.70*  CALCIUM -- 9.3 --  MG 1.2* -- --  PHOS -- -- --   CBC:  Basename 04/30/12 0630 04/30/12 0149 04/30/12 0135  WBC 9.6 -- 10.2  NEUTROABS -- -- --  HGB 12.5 13.9 --  HCT 37.7 41.0 --  MCV 84.7 -- 84.3  PLT 328 -- 325   Cardiac Enzymes:  Basename 04/30/12 1929 04/30/12 1210 04/30/12 0402  CKTOTAL 50 65 69  CKMB 2.4 2.6 2.5  CKMBINDEX -- -- --  TROPONINI <0.30 <0.30 <0.30   Thyroid Function Tests:  Basename 04/30/12 0630  TSH 0.257*  T4TOTAL --  T3FREE --  THYROIDAB --    Imaging: Dg Chest Portable 1 View  04/30/2012   *RADIOLOGY REPORT*  Clinical Data: Headache, chest pain, vomiting and tachycardia.  PORTABLE CHEST - 1 VIEW  Comparison: Chest radiograph performed 62,013  Findings: A ventriculoperitoneal shunt is again noted along the right side of the chest.  The lungs are relatively well expanded.  Vascular congestion is noted, with mildly increased interstitial markings, possibly reflecting minimal interstitial edema.  No pleural effusion or pneumothorax is seen.  The cardiomediastinal silhouette is borderline enlarged.  No acute osseous abnormalities are identified.  IMPRESSION: Vascular congestion and borderline cardiomegaly, with mildly increased interstitial markings; this could reflect minimal interstitial edema.  Original Report Authenticated By: Santa Lighter, M.D.    Cardiac Studies:  ECG: 7/12  Rapid flutter rate 200    Telemetry:  NSR rate 70-80 converted last night   Echo: 5/13  Study Conclusions  - Left ventricle: The cavity size was normal. Wall thickness was increased in a pattern of moderate LVH. Systolic function was normal. The estimated ejection fraction was in the range of 55% to 60%. Wall motion was normal; there were no regional wall motion abnormalities. Features are consistent with a pseudonormal left ventricular filling  pattern, with concomitant abnormal relaxation and increased filling pressure (grade 2 diastolic dysfunction). Doppler parameters are consistent with elevated mean left atrial filling pressure. - Left atrium: The atrium was mildly dilated. Anterior-posterior dimension: 27mm (2D). - Atrial septum: A patent foramen ovale cannot be excluded. - Pulmonary arteries: Systolic pressure could not be accurately estimated. - Inferior vena cava: The vessel was dilated; the respirophasic diameter changes were blunted (< 50%); findings are consistent with elevated central venous pressure. - Pericardium, extracardiac: A trivial pericardial effusion was  identified.    Medications:     . amLODipine  10 mg Oral Daily  . aspirin EC  81 mg Oral Daily  . levETIRAcetam  500 mg Oral BID  . losartan  25 mg Oral BID  . metoprolol tartrate  25 mg Oral QID  . pantoprazole  40 mg Oral Q1200  . polyethylene glycol  34 g Oral Daily  . potassium chloride  10 mEq Intravenous Q1 Hr x 5  . potassium chloride  10 mEq Intravenous Q1 Hr x 2  . sodium chloride  3 mL Intravenous Q12H  . sodium chloride  3 mL Intravenous Q12H  . DISCONTD: labetalol  400 mg Oral TID       . diltiazem (CARDIZEM) infusion 5 mg/hr (05/01/12 0256)    Assessment/Plan:  Afib/Flutter:  See not by Dr Harrington Challenger Agree with no anticoagulation given aneurysm and VP shind  Can change lopresser to 50 bid on D/C HTN:  Continue losartan    Jenkins Rouge 05/01/2012, 8:18 AM

## 2012-05-01 NOTE — Progress Notes (Addendum)
[facility] PROGRESS NOTE  [name] [id number] DOB: 06/20/1961 DOA: 04/30/2012 PCP: Richardson Dopp, PA  Assessment/Plan: Principal Problem:  *New onset a-fib Active Problems:  CAD, NATIVE VESSEL  DIASTOLIC HEART FAILURE, CHRONIC  SAH (subarachnoid hemorrhage)  Protein calorie malnutrition  Renal artery stenosis  Constipation  1.  A fib with RVR- wean off cardizem gtt- added metoprolol 50 mg BID- hold norvasc, continue to titrate ACE, no anticoagulation secondary to recent [facility] 2. SAH- recent d/c from hospital 3. Protein calorie malnutrition- encourage PO , ask nutrition to see 4. Constipation- add bowel regimen 5. HTN- see plan above, need tight control 6. hypokalmeia- repleat and recheck  tx out step down once off cardizem gtt Check AM labs  Spoke with daughter, plan is for patient to go home with her once she is better- she states that if patient is not complaint then after next hospitaliztion, she will need to go to SNF  Code Status: full Family Communication: LM for daughter Disposition Plan: home vs SNF     Consultants:  cardiology   HPI/Subjective: Patient wanting to go home No CP No SOB Says she is eating   Objective: Filed Vitals:   04/30/12 2021 05/01/12 0005 05/01/12 0400 05/01/12 0800  BP: 166/81 101/57    Pulse: 70 71    Temp:  98.4 F (36.9 C) 98.2 F (36.8 C) 98.7 F (37.1 C)  TempSrc:  Oral Oral Oral  Resp: 20 16    Height:      Weight:   65.3 kg (143 lb 15.4 oz)   SpO2: 100% 100%      Intake/Output Summary (Last 24 hours) at 05/01/12 0937 Last data filed at 05/01/12 0600  Gross per 24 hour  Intake   1300 ml  Output   1800 ml  Net   -500 ml    Exam:   General:  pleasant and cooperative, slow speech  Cardiovascular: rrr, no murmurs  Respiratory: clear anteriorly  Abdomen: +BS, soft, NT  Skin: no rashes or lesions  Data Reviewed: Basic Metabolic Panel:  Lab [id number] 04/30/12 0630 04/30/12 0149 04/27/12  0642 04/26/12 0500 04/25/12 0730  NA -- 145 148* 145 141 143  K -- 3.0* 2.9* 3.1* 3.4* 3.7  CL -- 109 113* 108 106 106  CO2 -- 21 -- 22 23 23   GLUCOSE -- 108* 146* 124* 92 96  BUN -- 8 6 6 9 13   CREATININE -- 1.25* 1.70* 1.27* 1.16* 1.35*  CALCIUM -- 9.3 -- 9.3 9.3 10.0  MG 1.2* -- -- -- -- --  PHOS -- -- -- -- -- --   Liver Function Tests: No results found for this basename: AST:5,ALT:5,ALKPHOS:5,BILITOT:5,PROT:5,ALBUMIN:5 in the last 168 hours No results found for this basename: LIPASE:5,AMYLASE:5 in the last 168 hours No results found for this basename: AMMONIA:5 in the last 168 hours CBC:  Lab 04/30/12 0630 04/30/12 0149 04/30/12 0135  WBC 9.6 -- 10.2  NEUTROABS -- -- --  HGB 12.5 13.9 13.0  HCT 37.7 41.0 38.2  MCV 84.7 -- 84.3  PLT 328 -- 325   Cardiac Enzymes:  Lab 04/30/12 1929 04/30/12 1210 04/30/12 0402  CKTOTAL 50 65 69  CKMB 2.4 2.6 2.5  CKMBINDEX -- -- --  TROPONINI <0.30 <0.30 <0.30   BNP (last 3 results)  Basename 03/14/12 0400 03/07/12 0319  PROBNP 1667.0* 7892.0*   CBG: No results found for this basename: GLUCAP:5 in the last 168 hours  Recent Results (from the past 240 hour(s))  MRSA PCR SCREENING     Status: Abnormal   Collection Time   04/30/12  7:04 PM      Component Value Range Status Comment   MRSA by PCR POSITIVE (*) NEGATIVE Final      Studies: Ct Head Wo Contrast  04/12/2012  *RADIOLOGY REPORT*  Clinical Data: Headaches in the front and back of head.  CT HEAD WITHOUT CONTRAST  Technique:  Contiguous axial images were obtained from the base of the skull through the vertex without contrast.  Comparison: 04/01/2012  Findings: Postoperative changes with right frontal temporal craniotomy and aneurysm clips over the right middle cerebral and right circle of Willis regions.  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 Right frontal ventriculostomy with trans frontal ventricular shunt tube.  Shunt  tube tip is in the region of the third ventricle. Ventricles remain decompressed without significant change since the previous study.  Streak artifact from metallic hardware limits visualization of some areas of brain but the visualized brain parenchyma appears to be homogeneous without mass effect or midline shift.  No abnormal extra-axial fluid collections.  Gray-white matter junctions are distinct.  Basal cisterns are not effaced.  No evidence of acute intracranial hemorrhage.  No significant change since previous study.  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  No acute intracranial abnormality.  No significant changes since the previous study.  Original Report Authenticated By: Neale Burly, M.D.   Dg Chest Portable 1 View  04/30/2012  *RADIOLOGY REPORT*  Clinical Data: Headache, chest pain, vomiting and tachycardia.  PORTABLE CHEST - 1 VIEW  Comparison: Chest radiograph performed 62,013  Findings: A ventriculoperitoneal shunt is again noted along the right side of the chest.  The lungs are relatively well expanded.  Vascular congestion is noted, with mildly increased interstitial markings, possibly reflecting minimal interstitial edema.  No pleural effusion or pneumothorax is seen.  The cardiomediastinal silhouette is borderline enlarged.  No acute osseous abnormalities are identified.  IMPRESSION: Vascular congestion and borderline cardiomegaly, with mildly increased interstitial markings; this could reflect minimal interstitial edema.  Original Report Authenticated By: Santa Lighter, M.D.   Dg Abd Portable 1v  04/25/2012  *RADIOLOGY REPORT*  Clinical Data: Nausea and vomiting.  PORTABLE ABDOMEN - 1 VIEW  Comparison: Portable abdomen x-ray 04/23/2012 dating back to 04/02/2012.  Findings: Bowel gas pattern unremarkable without evidence of obstruction or significant ileus.  Interval decrease in the bowel gas since the examination 2 days ago.   Persistent large stool burden in the cecum, ascending colon, and transverse colon.  No visible opaque urinary tract calculi.  Phleboliths in the right side of the pelvis.  Right-sided ventriculoperitoneal shunt catheter tubing with its tip in the right upper pelvis.  IMPRESSION: No acute abdominal abnormality.  Large stool burden in the cecum, ascending colon, and transverse colon as noted previously.  Original Report Authenticated By: Deniece Portela, M.D.   Dg Abd Portable 1v  04/23/2012  *RADIOLOGY REPORT*  Clinical Data: Abdominal pain and distention.  Nausea.  PORTABLE ABDOMEN - 1 VIEW 04/23/2012 1848 hours:  Comparison: Portable abdomen x-rays 04/13/2012 dating back to 04/02/2012.  Findings: Bowel gas pattern nonobstructive.  Gas within normal caliber small bowel diffusely.  Very large amount of stool in the cecum, ascending colon, and proximal transverse colon, unchanged. Gas within nondistended descending colon, sigmoid colon, and rectum.  No suggestion of free air on the supine image.  Right- sided ventriculoperitoneal shunt catheter  tubing with its tip in the right side of the upper pelvis.  Regional skeleton unremarkable.  No abnormal calcifications.  IMPRESSION: No acute abdominal abnormality.  Very large stool burden in the cecum, ascending colon, and proximal transverse colon, unchanged from prior examinations.  Original Report Authenticated By: Deniece Portela, M.D.   Dg Abd Portable 1v  04/13/2012  *RADIOLOGY REPORT*  Clinical Data: Panda tube advancement  PORTABLE ABDOMEN - 1 VIEW  Comparison: 04/12/2012  Findings: Weighted feeding tube is looped in the proximal gastric body, kinked at the level of the pylorus, and points back towards the distal gastric body.  Nonobstructive bowel gas pattern.  VP shunt catheter terminating in the right pelvis.  IMPRESSION: Weighted feeding tube is looped/kinked in the stomach and terminates in the distal gastric body, as described above.  Given  redundancy, if post pyloric positioning is required, removal and replacement would be necessary.  Original Report Authenticated By: Julian Hy, M.D.   Dg Abd Portable 1v  04/12/2012  *RADIOLOGY REPORT*  Clinical Data: Panda placement  PORTABLE ABDOMEN - 1 VIEW  Comparison: 04/02/2012  Findings: There is nonspecific nonobstructive bowel gas pattern. An  NG feeding tube is noted coiled within stomach with tip in mid stomach. Stable ventriculoperitoneal shunt tubing in the right abdomen.  IMPRESSION: NG feeding tube coiled within stomach with tip in mid stomach.  Original Report Authenticated By: Lahoma Crocker, M.D.   Dg Abd Portable 1v  04/02/2012  *RADIOLOGY REPORT*  Clinical Data: Feeding tube reinsertion.  PORTABLE ABDOMEN - 1 VIEW  Comparison: Chest x-ray 04/02/2012.  Findings: Tip of feeding tube is now in the body of the stomach.  A ventriculoperitoneal shunt tubing is seen projecting over the right upper quadrant of the abdomen.  IMPRESSION: 1.  Tip of feeding tube is now in the body of the stomach.  Original Report Authenticated By: Etheleen Mayhew, M.D.   Dg Abd Portable 1v  04/02/2012  *RADIOLOGY REPORT*  Clinical Data: Feeding tube placement  PORTABLE ABDOMEN - 1 VIEW  Comparison: 03/29/2012  Findings: Feeding tube tip projects over the distal stomach. Nonobstructive bowel gas pattern.  Hemidiaphragms are excluded from the image.  No acute osseous finding.  IMPRESSION: Feeding tube tip projects over the distal stomach.  Original Report Authenticated By: Suanne Marker, M.D.    Scheduled Meds:   . aspirin EC  81 mg Oral Daily  . levETIRAcetam  500 mg Oral BID  . losartan  25 mg Oral BID  . metoprolol tartrate  50 mg Oral BID  . pantoprazole  40 mg Oral Q1200  . polyethylene glycol  34 g Oral Daily  . potassium chloride  10 mEq Intravenous Q1 Hr x 5  . potassium chloride  10 mEq Intravenous Q1 Hr x 2  . sodium chloride  3 mL Intravenous Q12H  . sodium chloride  3 mL Intravenous  Q12H  . DISCONTD: amLODipine  10 mg Oral Daily  . DISCONTD: labetalol  400 mg Oral TID  . DISCONTD: metoprolol tartrate  25 mg Oral QID   Continuous Infusions:   . diltiazem (CARDIZEM) infusion 5 mg/hr (05/01/12 0256)         Eulogio Bear  [facility] Pager [phone].  If 8PM-8AM, please contact night-coverage at [email or web address], password [facility] 05/01/2012, 9:37 AM  LOS: 1 day

## 2012-05-02 LAB — BASIC METABOLIC PANEL
BUN: 9 mg/dL (ref 6–23)
CO2: 22 mEq/L (ref 19–32)
Calcium: 8.8 mg/dL (ref 8.4–10.5)
GFR calc non Af Amer: 56 mL/min — ABNORMAL LOW (ref >90)
Glucose, Bld: 96 mg/dL (ref 70–99)
Sodium: 140 mEq/L (ref 135–145)

## 2012-05-02 LAB — CBC
MCH: 28 pg (ref 26.0–34.0)
MCV: 84.8 fL (ref 78.0–100.0)
Platelets: 356 10*3/uL (ref 150–400)
RBC: 4.35 MIL/uL (ref 3.87–5.11)
RDW: 14.4 % (ref 11.5–15.5)
WBC: 7.9 10*3/uL (ref 4.0–10.5)

## 2012-05-02 MED ORDER — MUPIROCIN 2 % EX OINT
1.0000 "application " | TOPICAL_OINTMENT | Freq: Two times a day (BID) | CUTANEOUS | Status: DC
  Administered 2012-05-02 – 2012-05-03 (×3): 1 via NASAL
  Filled 2012-05-02: qty 22

## 2012-05-02 MED ORDER — POTASSIUM CHLORIDE CRYS ER 20 MEQ PO TBCR
40.0000 meq | EXTENDED_RELEASE_TABLET | Freq: Once | ORAL | Status: DC
  Filled 2012-05-02: qty 2

## 2012-05-02 MED ORDER — CHLORHEXIDINE GLUCONATE CLOTH 2 % EX PADS: 6.0000 | MEDICATED_PAD | Freq: Every day | CUTANEOUS | Status: DC

## 2012-05-02 MED ORDER — METOPROLOL TARTRATE 50 MG PO TABS
75.0000 mg | ORAL_TABLET | Freq: Two times a day (BID) | ORAL | Status: DC
  Administered 2012-05-02 – 2012-05-03 (×3): 75 mg via ORAL
  Filled 2012-05-02 (×4): qty 1

## 2012-05-02 NOTE — Progress Notes (Signed)
INITIAL ADULT NUTRITION ASSESSMENT Date: 05/02/2012   Time: 11:20 AM Reason for Assessment: MD consult  INTERVENTION:  Magic cup TID between meals, can be used for medication administration.  Recommend consider PEG for medication administration as well as bolus TF if pt not eating.    ASSESSMENT: Female 51 y.o.  Dx: New onset a-fib  Hx:  Past Medical History  Diagnosis Date  . HTN (hypertension)     resistant  . Hypercholesterolemia   . Tobacco abuse     resumed smoking half a pack a day. She was a smoker in the past and states she was abl eto stop in the past using nicotine patches  . CAD (coronary artery disease)     Pt has St elevation MI in Feb 2009. Left hear tcath at tha ttime showed a 70% distal LAD lesion that was hazy consistent with a plaque rupture. She had a 50% distal circumflex stenosis an a 95% distal RCA stenosis. She did havve drug eluting stent placed in her distal RCA  . Chronic kidney disease     most recent creatinine 1.4 in 3/10  . Intracranial hemorrhage, spontaneous intraparenchymal, idiopathic, remote, resolved   . Diastolic heart failure     pt most recent echo was in Feb 2009 w an EF of 60% and severe left ventricular hypertrophy. The pt did have evidence of diastolic dysfunction. The RV appeared normal  . Subarachnoid hemorrhage     03/2012   Related Meds:     . aspirin EC  81 mg Oral Daily  . docusate sodium  100 mg Oral BID  . levETIRAcetam  500 mg Oral BID  . losartan  50 mg Oral BID  . metoprolol tartrate  75 mg Oral BID  . pantoprazole  40 mg Oral Q1200  . polyethylene glycol  34 g Oral Daily  . potassium chloride  40 mEq Oral Once  . sodium chloride  3 mL Intravenous Q12H  . sodium chloride  3 mL Intravenous Q12H  . DISCONTD: losartan  25 mg Oral BID  . DISCONTD: metoprolol tartrate  50 mg Oral BID    Ht: 5\' 1"  (154.9 cm)  Wt: 143 lb 4.8 oz (65 kg)  Ideal Wt: 47.7 kg % Ideal Wt: 136%  Usual Wt: 150-155 lbs % Usual Wt:  95%  Body mass index is 27.08 kg/(m^2). Overweight  Food/Nutrition Related Hx: Pt well known from lengthy hospitalization at [facility] for aneurysm. There pt had refused medications as well as meals. Per SLP pt with a cognitive based origin for her decreased intake.  No family present at this time. Per RN/MD note family will consider SNF placement if she fails at home again.   Pt with 5% wt loss since admission in 02/2012.  Per RN pt did eat about 50% of her Lunch today.   Labs:  CMP     Component Value Date/Time   NA 140 05/02/2012 0344   K 3.4* 05/02/2012 0344   CL 105 05/02/2012 0344   CO2 22 05/02/2012 0344   GLUCOSE 96 05/02/2012 0344   BUN 9 05/02/2012 0344   CREATININE 1.13* 05/02/2012 0344   CALCIUM 8.8 05/02/2012 0344   PROT 7.4 04/12/2012 0730   ALBUMIN 3.5 04/12/2012 0730   AST 14 04/12/2012 0730   ALT 10 04/12/2012 0730   ALKPHOS 99 04/12/2012 0730   BILITOT 0.3 04/12/2012 0730   GFRNONAA 56* 05/02/2012 0344   GFRAA 65* 05/02/2012 0344  CBG (last 3)  No results  found for this basename: GLUCAP:3 in the last 72 hours Magnesium  Date/Time Value Range Status  05/01/2012 11:30 AM 1.1* 1.5 - 2.5 mg/dL Final  04/30/2012 12:10 PM 1.2* 1.5 - 2.5 mg/dL Final  04/16/2012  7:00 AM 1.5  1.5 - 2.5 mg/dL Final   Phosphorus  Date/Time Value Range Status  04/16/2012  7:00 AM 2.8  2.3 - 4.6 mg/dL Final  04/15/2012  5:00 AM 3.7  2.3 - 4.6 mg/dL Final  04/14/2012  4:15 AM 3.6  2.3 - 4.6 mg/dL Final    Intake/Output Summary (Last 24 hours) at 05/02/12 1121 Last data filed at 05/02/12 1117  Gross per 24 hour  Intake     97 ml  Output   1700 ml  Net  -1603 ml   Diet Order: Heart Healthy  Supplements/Tube Feeding: none  IVF:    DISCONTD: diltiazem (CARDIZEM) infusion Last Rate: 5 mg/hr (05/02/12 0017)    Estimated Nutritional Needs:   Kcal:  1700-1900 Protein:  70-85 grams Fluid:  >2 L/day  NUTRITION DIAGNOSIS: -Inadequate oral intake (NI-2.1).  Status: Ongoing  RELATED TO: cognition issues  after recent aneurysm  AS EVIDENCE BY: 5% wt loss x 2 months, meal completion <50%  MONITORING/EVALUATION(Goals): Goal: Pt to meet >/= 90% of their estimated nutrition needs. Monitor: PO intake, labs, weight, plan of care  EDUCATION NEEDS: -No education needs identified at this time   DOCUMENTATION CODES Per approved criteria  -Not Applicable    [place], LDN, [place] Weekend/After Hours Pager  05/02/2012, 11:20 AM

## 2012-05-02 NOTE — Progress Notes (Signed)
[facility] PROGRESS NOTE  [name] [id number] DOB: 1961/03/26 DOA: 04/30/2012 PCP: No primary provider on file.  Assessment/Plan: Principal Problem:  *New onset a-fib Active Problems:  CAD, NATIVE VESSEL  DIASTOLIC HEART FAILURE, CHRONIC  SAH (subarachnoid hemorrhage)  Protein calorie malnutrition  Renal artery stenosis  Constipation  1.  A fib with RVR-  off cardizem gtt- added metoprolol 75 mg BID, continue to titrate ACE for better BP control, no anticoagulation secondary to recent [facility] 2. SAH- recent d/c from hospital 3. Protein calorie malnutrition- encourage PO , ask nutrition to see 4. Constipation- add bowel regimen- patient says she has BM every other day 5. HTN- see plan above, need tight control 6. hypokalmeia- repleat and recheck   tx out step down  Check AM labs  Spoke with daughter, plan is for patient to go home with her once she is better- she states that if patient is not complaint then after next hospitaliztion, she will need to go to SNF  Code Status: full Family Communication: daughter yesterday Disposition Plan: home Monday     Consultants:  cardiology   HPI/Subjective: Patient wanting to go home  Says she is eating and having gas but no BM yet   Objective: Filed Vitals:   05/01/12 2000 05/02/12 0000 05/02/12 0400 05/02/12 0440  BP: 155/92 148/73    Pulse: 85 95    Temp: 98.8 F (37.1 C) 98 F (36.7 C) 98.7 F (37.1 C) 98.9 F (37.2 C)  TempSrc: Oral Oral Oral Oral  Resp: 14 19    Height:      Weight:  65 kg (143 lb 4.8 oz)    SpO2: 100% 97%      Intake/Output Summary (Last 24 hours) at 05/02/12 0932 Last data filed at 05/02/12 0400  Gross per 24 hour  Intake    122 ml  Output   1350 ml  Net  -1228 ml    Exam:   General:  pleasant and cooperative, slow speech  Cardiovascular: rrr, no murmurs  Respiratory: clear anteriorly  Abdomen: +BS, soft, NT  Skin: no rashes or lesions  Data Reviewed: Basic  Metabolic Panel:  Lab [id number] 05/01/12 1130 04/30/12 1210 04/30/12 0630 04/30/12 0149 04/27/12 0642 04/26/12 0500  NA 140 142 -- 145 148* 145 --  K 3.4* 3.7 -- 3.0* 2.9* 3.1* --  CL 105 109 -- 109 113* 108 --  CO2 22 22 -- 21 -- 22 23  GLUCOSE 96 132* -- 108* 146* 124* --  BUN 9 7 -- 8 6 6  --  CREATININE 1.13* 1.06 -- 1.25* 1.70* 1.27* --  CALCIUM 8.8 8.6 -- 9.3 -- 9.3 9.3  MG -- 1.1* 1.2* -- -- -- --  PHOS -- -- -- -- -- -- --   Liver Function Tests: No results found for this basename: AST:5,ALT:5,ALKPHOS:5,BILITOT:5,PROT:5,ALBUMIN:5 in the last 168 hours No results found for this basename: LIPASE:5,AMYLASE:5 in the last 168 hours No results found for this basename: AMMONIA:5 in the last 168 hours CBC:  Lab 05/02/12 0344 04/30/12 0630 04/30/12 0149 04/30/12 0135  WBC 7.9 9.6 -- 10.2  NEUTROABS -- -- -- --  HGB 12.2 12.5 13.9 13.0  HCT 36.9 37.7 41.0 38.2  MCV 84.8 84.7 -- 84.3  PLT 356 328 -- 325   Cardiac Enzymes:  Lab 04/30/12 1929 04/30/12 1210 04/30/12 0402  CKTOTAL 50 65 69  CKMB 2.4 2.6 2.5  CKMBINDEX -- -- --  TROPONINI <0.30 <0.30 <0.30   BNP (last 3  results)  Basename 03/14/12 0400 03/07/12 0319  PROBNP 1667.0* 7892.0*   CBG: No results found for this basename: GLUCAP:5 in the last 168 hours  Recent Results (from the past 240 hour(s))  MRSA PCR SCREENING     Status: Abnormal   Collection Time   04/30/12  7:04 PM      Component Value Range Status Comment   MRSA by PCR POSITIVE (*) NEGATIVE Final      Studies: Ct Head Wo Contrast  04/12/2012  *RADIOLOGY REPORT*  Clinical Data: Headaches in the front and back of head.  CT HEAD WITHOUT CONTRAST  Technique:  Contiguous axial images were obtained from the base of the skull through the vertex without contrast.  Comparison: 04/01/2012  Findings: Postoperative changes with right frontal temporal craniotomy and aneurysm clips over the right middle cerebral and right circle of Willis regions.  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 Right frontal ventriculostomy with trans frontal ventricular shunt tube.  Shunt tube tip is in the region of the third ventricle. Ventricles remain decompressed without significant change since the previous study.  Streak artifact from metallic hardware limits visualization of some areas of brain but the visualized brain parenchyma appears to be homogeneous without mass effect or midline shift.  No abnormal extra-axial fluid collections.  Gray-white matter junctions are distinct.  Basal cisterns are not effaced.  No evidence of acute intracranial hemorrhage.  No significant change since previous study.  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  No acute intracranial abnormality.  No significant changes since the previous study.  Original Report Authenticated By: Neale Burly, M.D.   Dg Chest Portable 1 View  04/30/2012  *RADIOLOGY REPORT*  Clinical Data: Headache, chest pain, vomiting and tachycardia.  PORTABLE CHEST - 1 VIEW  Comparison: Chest radiograph performed 62,013  Findings: A ventriculoperitoneal shunt is again noted along the right side of the chest.  The lungs are relatively well expanded.  Vascular congestion is noted, with mildly increased interstitial markings, possibly reflecting minimal interstitial edema.  No pleural effusion or pneumothorax is seen.  The cardiomediastinal silhouette is borderline enlarged.  No acute osseous abnormalities are identified.  IMPRESSION: Vascular congestion and borderline cardiomegaly, with mildly increased interstitial markings; this could reflect minimal interstitial edema.  Original Report Authenticated By: Santa Lighter, M.D.   Dg Abd Portable 1v  04/25/2012  *RADIOLOGY REPORT*  Clinical Data: Nausea and vomiting.  PORTABLE ABDOMEN - 1 VIEW  Comparison: Portable abdomen x-ray 04/23/2012 dating back to 04/02/2012.  Findings:  Bowel gas pattern unremarkable without evidence of obstruction or significant ileus.  Interval decrease in the bowel gas since the examination 2 days ago.  Persistent large stool burden in the cecum, ascending colon, and transverse colon.  No visible opaque urinary tract calculi.  Phleboliths in the right side of the pelvis.  Right-sided ventriculoperitoneal shunt catheter tubing with its tip in the right upper pelvis.  IMPRESSION: No acute abdominal abnormality.  Large stool burden in the cecum, ascending colon, and transverse colon as noted previously.  Original Report Authenticated By: Deniece Portela, M.D.   Dg Abd Portable 1v  04/23/2012  *RADIOLOGY REPORT*  Clinical Data: Abdominal pain and distention.  Nausea.  PORTABLE ABDOMEN - 1 VIEW 04/23/2012 1848 hours:  Comparison: Portable abdomen x-rays 04/13/2012 dating back to 04/02/2012.  Findings: Bowel gas pattern nonobstructive.  Gas within normal caliber small bowel diffusely.  Very large amount of stool  in the cecum, ascending colon, and proximal transverse colon, unchanged. Gas within nondistended descending colon, sigmoid colon, and rectum.  No suggestion of free air on the supine image.  Right- sided ventriculoperitoneal shunt catheter tubing with its tip in the right side of the upper pelvis.  Regional skeleton unremarkable.  No abnormal calcifications.  IMPRESSION: No acute abdominal abnormality.  Very large stool burden in the cecum, ascending colon, and proximal transverse colon, unchanged from prior examinations.  Original Report Authenticated By: Deniece Portela, M.D.   Dg Abd Portable 1v  04/13/2012  *RADIOLOGY REPORT*  Clinical Data: Panda tube advancement  PORTABLE ABDOMEN - 1 VIEW  Comparison: 04/12/2012  Findings: Weighted feeding tube is looped in the proximal gastric body, kinked at the level of the pylorus, and points back towards the distal gastric body.  Nonobstructive bowel gas pattern.  VP shunt catheter terminating in the  right pelvis.  IMPRESSION: Weighted feeding tube is looped/kinked in the stomach and terminates in the distal gastric body, as described above.  Given redundancy, if post pyloric positioning is required, removal and replacement would be necessary.  Original Report Authenticated By: Julian Hy, M.D.   Dg Abd Portable 1v  04/12/2012  *RADIOLOGY REPORT*  Clinical Data: Panda placement  PORTABLE ABDOMEN - 1 VIEW  Comparison: 04/02/2012  Findings: There is nonspecific nonobstructive bowel gas pattern. An  NG feeding tube is noted coiled within stomach with tip in mid stomach. Stable ventriculoperitoneal shunt tubing in the right abdomen.  IMPRESSION: NG feeding tube coiled within stomach with tip in mid stomach.  Original Report Authenticated By: Lahoma Crocker, M.D.   Dg Abd Portable 1v  04/02/2012  *RADIOLOGY REPORT*  Clinical Data: Feeding tube reinsertion.  PORTABLE ABDOMEN - 1 VIEW  Comparison: Chest x-ray 04/02/2012.  Findings: Tip of feeding tube is now in the body of the stomach.  A ventriculoperitoneal shunt tubing is seen projecting over the right upper quadrant of the abdomen.  IMPRESSION: 1.  Tip of feeding tube is now in the body of the stomach.  Original Report Authenticated By: Etheleen Mayhew, M.D.   Dg Abd Portable 1v  04/02/2012  *RADIOLOGY REPORT*  Clinical Data: Feeding tube placement  PORTABLE ABDOMEN - 1 VIEW  Comparison: 03/29/2012  Findings: Feeding tube tip projects over the distal stomach. Nonobstructive bowel gas pattern.  Hemidiaphragms are excluded from the image.  No acute osseous finding.  IMPRESSION: Feeding tube tip projects over the distal stomach.  Original Report Authenticated By: Suanne Marker, M.D.    Scheduled Meds:    . aspirin EC  81 mg Oral Daily  . docusate sodium  100 mg Oral BID  . levETIRAcetam  500 mg Oral BID  . losartan  50 mg Oral BID  . metoprolol tartrate  75 mg Oral BID  . pantoprazole  40 mg Oral Q1200  . polyethylene glycol  34 g Oral  Daily  . potassium chloride  40 mEq Oral Once  . sodium chloride  3 mL Intravenous Q12H  . sodium chloride  3 mL Intravenous Q12H  . DISCONTD: amLODipine  10 mg Oral Daily  . DISCONTD: losartan  25 mg Oral BID  . DISCONTD: metoprolol tartrate  50 mg Oral BID  . DISCONTD: metoprolol tartrate  25 mg Oral QID   Continuous Infusions:    . DISCONTD: diltiazem (CARDIZEM) infusion 5 mg/hr (05/02/12 0017)         Eulogio Bear  [facility] Pager [phone].  If 8PM-8AM, please contact  night-coverage at [email or web address], password [facility] 05/01/2012, 9:37 AM  LOS: 1 day

## 2012-05-02 NOTE — Progress Notes (Signed)
Report called to RN on 4th floor.  Pt to be transferred to room 1405

## 2012-05-02 NOTE — Progress Notes (Signed)
Patient ID: [name], female   DOB: [birth year], 51 y.o.   MRN: [id number]    Subjective:  NO complaints.    Objective:  Filed Vitals:   05/01/12 2000 05/02/12 0000 05/02/12 0400 05/02/12 0440  BP: 155/92 148/73    Pulse: 85 95    Temp: 98.8 F (37.1 C) 98 F (36.7 C) 98.7 F (37.1 C) 98.9 F (37.2 C)  TempSrc: Oral Oral Oral Oral  Resp: 14 19    Height:      Weight:  65 kg (143 lb 4.8 oz)    SpO2: 100% 97%      Intake/Output from previous day:  Intake/Output Summary (Last 24 hours) at 05/02/12 [id number] Last data filed at 05/02/12 0400  Gross per 24 hour  Intake    152 ml  Output   1350 ml  Net  -1198 ml    Physical Exam: Affect appropriate Chronically ill overweight black female HEENT: normal Neck supple with no adenopathy JVP normal no bruits no thyromegaly Lungs clear with no wheezing and good diaphragmatic motion Heart:  S1/S2 no murmur, no rub, gallop or click PMI normal Abdomen: benighn, BS positve, no tenderness, no AAA no bruit.  No HSM or HJR Distal pulses intact with no bruits No edema Neuro non-focal Skin warm and dry No muscular weakness   Lab Results: Basic Metabolic Panel:  Basename 05/02/12 0344 05/01/12 1130 04/30/12 1210  NA 140 142 --  K 3.4* 3.7 --  CL 105 109 --  CO2 22 22 --  GLUCOSE 96 132* --  BUN 9 7 --  CREATININE 1.13* 1.06 --  CALCIUM 8.8 8.6 --  MG -- 1.1* 1.2*  PHOS -- -- --   CBC:  Basename 05/02/12 0344 04/30/12 0630  WBC 7.9 9.6  NEUTROABS -- --  HGB 12.2 12.5  HCT 36.9 37.7  MCV 84.8 84.7  PLT 356 328   Cardiac Enzymes:  Basename 04/30/12 1929 04/30/12 1210 04/30/12 0402  CKTOTAL 50 65 69  CKMB 2.4 2.6 2.5  CKMBINDEX -- -- --  TROPONINI <0.30 <0.30 <0.30   Thyroid Function Tests:  Basename 04/30/12 0630  TSH 0.257*  T4TOTAL --  T3FREE --  THYROIDAB --    Imaging: No results found.  Cardiac Studies:  ECG: 7/12  Rapid flutter rate 200    Telemetry:  NSR rate 70-80 converted last night    Echo: 5/13  Study Conclusions  - Left ventricle: The cavity size was normal. Wall thickness was increased in a pattern of moderate LVH. Systolic function was normal. The estimated ejection fraction was in the range of 55% to 60%. Wall motion was normal; there were no regional wall motion abnormalities. Features are consistent with a pseudonormal left ventricular filling pattern, with concomitant abnormal relaxation and increased filling pressure (grade 2 diastolic dysfunction). Doppler parameters are consistent with elevated mean left atrial filling pressure. - Left atrium: The atrium was mildly dilated. Anterior-posterior dimension: 68mm (2D). - Atrial septum: A patent foramen ovale cannot be excluded. - Pulmonary arteries: Systolic pressure could not be accurately estimated. - Inferior vena cava: The vessel was dilated; the respirophasic diameter changes were blunted (< 50%); findings are consistent with elevated central venous pressure. - Pericardium, extracardiac: A trivial pericardial effusion was identified.    Medications:      . aspirin EC  81 mg Oral Daily  . docusate sodium  100 mg Oral BID  . levETIRAcetam  500 mg Oral BID  . losartan  50 mg Oral  BID  . metoprolol tartrate  50 mg Oral BID  . pantoprazole  40 mg Oral Q1200  . polyethylene glycol  34 g Oral Daily  . sodium chloride  3 mL Intravenous Q12H  . sodium chloride  3 mL Intravenous Q12H  . DISCONTD: amLODipine  10 mg Oral Daily  . DISCONTD: losartan  25 mg Oral BID  . DISCONTD: metoprolol tartrate  25 mg Oral QID        . diltiazem (CARDIZEM) infusion 5 mg/hr (05/02/12 0017)    Assessment/Plan:  Afib/Flutter:  See not by Dr Harrington Challenger Agree with no anticoagulation given aneurysm and VP shunt   Can change lopresser to 50 bid on D/C HTN:  Continue losartan  D/C foley ? D/C home  Will sign off    Jenkins Rouge 05/02/2012, 7:52 AM

## 2012-05-03 DIAGNOSIS — N189 Chronic kidney disease, unspecified: Secondary | ICD-10-CM

## 2012-05-03 LAB — MAGNESIUM: Magnesium: 4 mg/dL — ABNORMAL HIGH (ref 1.5–2.5)

## 2012-05-03 MED ORDER — POTASSIUM CHLORIDE ER 10 MEQ PO TBCR: 20.0000 meq | EXTENDED_RELEASE_TABLET | Freq: Two times a day (BID) | ORAL | Status: DC

## 2012-05-03 MED ORDER — MAGNESIUM SULFATE 40 MG/ML IJ SOLN
4.0000 g | Freq: Once | INTRAMUSCULAR | Status: AC
  Administered 2012-05-03: 4 g via INTRAVENOUS
  Filled 2012-05-03: qty 100

## 2012-05-03 MED ORDER — POLYETHYLENE GLYCOL 3350 17 GM/SCOOP PO POWD: 34.0000 g | Freq: Every day | ORAL | Status: AC

## 2012-05-03 MED ORDER — METOPROLOL TARTRATE 25 MG PO TABS: 75.0000 mg | ORAL_TABLET | Freq: Two times a day (BID) | ORAL | Status: DC

## 2012-05-03 MED ORDER — LOSARTAN POTASSIUM 50 MG PO TABS: 50.0000 mg | ORAL_TABLET | Freq: Two times a day (BID) | ORAL | Status: DC

## 2012-05-03 MED ORDER — POTASSIUM CHLORIDE CRYS ER 20 MEQ PO TBCR
40.0000 meq | EXTENDED_RELEASE_TABLET | Freq: Every day | ORAL | Status: DC
  Administered 2012-05-03: 40 meq via ORAL
  Filled 2012-05-03: qty 2

## 2012-05-03 MED ORDER — MAGNESIUM OXIDE 400 MG PO TABS: 400.0000 mg | ORAL_TABLET | Freq: Every day | ORAL | Status: DC

## 2012-05-03 MED ORDER — DSS 100 MG PO CAPS: 100.0000 mg | ORAL_CAPSULE | Freq: Two times a day (BID) | ORAL | Status: AC

## 2012-05-03 NOTE — Progress Notes (Signed)
Pt with IV infiltrate, refusing IV restart due to poor veins and "tired of being stuck." Pt states that she wants to wait and see if she will be discharged tomorrow before attempting restart. Informed pt that she may require access before d/c for BP control. Pt still requesting to wait until AM.

## 2012-05-03 NOTE — Care Management Note (Signed)
    Page 1 of 1   05/04/2012     8:56:20 AM   CARE MANAGEMENT NOTE 05/04/2012  Patient:  MELEIGHA, OKEEFE   Account Number:  [id number]  Date Initiated:  05/03/2012  Documentation initiated by:  Dessa Phi  Subjective/Objective Assessment:   ADMITTED W/SOB.NEW ONSET AFIB.READMIT-ANEURYSM.     Action/Plan:   FROM HOME.ACTIVE [facility].   Anticipated DC Date:  05/06/2012   Anticipated DC Plan:  [place]  CM consult      [facility] Choice  Resumption Of Svcs/PTA Provider   Choice offered to / List presented to:  C-1 Patient        [place] arranged  HH-1 RN  [place].   Status of service:  Completed, signed off Medicare Important Message given?   (If response is "NO", the following Medicare IM given date fields will be blank) Date Medicare IM given:   Date Additional Medicare IM given:    Discharge Disposition:  [place]  Per UR Regulation:  Reviewed for med. necessity/level of care/duration of stay  If discussed at [place] of Stay Meetings, dates discussed:    Comments:  05/03/12 Emanuele Mcwhirter RN,BSN,NCM [address] [place] [facility] [place].

## 2012-05-03 NOTE — Progress Notes (Signed)
MD made aware of 23bts V-tach this am. Pt was asymtomatic at the time and vital signs stable. No new orders received at this time. MD made aware that pt had no IV access and that she did not want a new one put in. MD stated to leave IV out until she rounds on pt this am. Will continue to monitor.  [name] [facility] 8:28 AM 05/03/2012

## 2012-05-03 NOTE — Discharge Summary (Signed)
Physician Discharge Summary  Josely Dinelli [id number] DOB: [birth year] DOA: 04/30/2012  PCP: No primary provider on file.  Admit date: 04/30/2012 Discharge date: 05/03/2012  Recommendations for Outpatient Follow-up:  1. BMP for K+  Discharge Diagnoses:  Principal Problem:  *New onset a-fib Active Problems:  CAD, NATIVE VESSEL  DIASTOLIC HEART FAILURE, CHRONIC  SAH (subarachnoid hemorrhage)  Protein calorie malnutrition  Renal artery stenosis  Constipation   Discharge Condition: improved  Diet recommendation: cardiac  History of present illness:  [name] is an 51 y.o. female presents tonight to [facility] via EMS because of palpitations. She was found to be in atrial fibrillation with rapid ventricular rate which I believe to be new onset. She is well-known to the pulmonary critical care service and cardiology service because of a recent admission. and was found to have subarachnoid hemorrhage and cerebral angiogram showed 4 aneurysms of which one was called and patient had undergone VP shunt. Patient during the stay had been intubated twice. And her blood pressure was found to be difficult to be controlled. Patient was transferred from ICU to rehabilitation. Patient was getting feeds through Panda tube. The tube was discontinued and patient has had poor total intake since then. Her blood pressure was difficult to control the past. Evaluation in emergency room showed she has hypokalemia with potassium of 2.9, creatinine 1.7, and chest x-ray showed interstitial edema. Her EKG showed fibrillation with rapid ventricular rate. She was on IV Cardizem, and hospitalist was asked to admit her for new onset of atrial fibrillation with rapid ventricular rate.   Hospital Course:  1. A fib with RVR- off cardizem gtt- added metoprolol 75 mg BID, no anticoagulation secondary to recent [facility]- converted back to sinus 2. SAH- recent d/c from hospital  3. Protein calorie malnutrition- encourage PO     4. Constipation- added bowel regimen- patient says she has BM every other day  5. HTN- see plan above, need tight control  6. hypokalmeia- repleat- added daily dose. Will need BMP to follow 7. hypomagnesium- repleat with PO daily -resume home health- high risk for re-hospitalization    Consultations:  cardiology  Discharge Exam: Filed Vitals:   05/03/12 0714  BP: 154/94  Pulse: 89  Temp:   Resp: 20   Filed Vitals:   05/02/12 2055 05/02/12 2220 05/03/12 0516 05/03/12 0714  BP: 168/124 141/90 135/94 154/94  Pulse:  86 86 89  Temp:  98.3 F (36.8 C) 98.4 F (36.9 C)   TempSrc:  Oral Oral   Resp:  20 18 20   Height:      Weight:   64 kg (141 lb 1.5 oz)   SpO2:  97% 98% 98%   General: pleasant/cooperative Cardiovascular: rrr Respiratory: clear ant +BS, soft, NT -c/c/e  Discharge Instructions   Medication List  As of 05/03/2012 12:31 PM   ASK your doctor about these medications         amLODipine 10 MG tablet   Commonly known as: NORVASC   Take 1 tablet (10 mg total) by mouth daily. This is for blood pressure      labetalol 200 MG tablet   Commonly known as: NORMODYNE   Take 2 tablets (400 mg total) by mouth 3 (three) times daily.      levETIRAcetam 500 MG tablet   Commonly known as: KEPPRA   Take 1 tablet (500 mg total) by mouth 2 (two) times daily.      losartan 25 MG tablet   Commonly known as:  COZAAR   Take 1 tablet (25 mg total) by mouth 2 (two) times daily.      metoCLOPramide 10 MG tablet   Commonly known as: REGLAN   Take 1 tablet (10 mg total) by mouth 3 (three) times daily as needed (nausea).      nitroGLYCERIN 0.4 MG SL tablet   Commonly known as: NITROSTAT   Place 1 tablet (0.4 mg total) under the tongue every 5 (five) minutes as needed for chest pain. For chest pain      omeprazole 20 MG capsule   Commonly known as: PRILOSEC   Take 1 capsule (20 mg total) by mouth daily.      polyethylene glycol powder powder   Commonly known as:  GLYCOLAX/MIRALAX   Take 34 g by mouth daily.              The results of significant diagnostics from this hospitalization (including imaging, microbiology, ancillary and laboratory) are listed below for reference.    Significant Diagnostic Studies: Ct Head Wo Contrast  04/12/2012  *RADIOLOGY REPORT*  Clinical Data: Headaches in the front and back of head.  CT HEAD WITHOUT CONTRAST  Technique:  Contiguous axial images were obtained from the base of the skull through the vertex without contrast.  Comparison: 04/01/2012  Findings: Postoperative changes with right frontal temporal craniotomy and aneurysm clips over the right middle cerebral and right circle of Willis regions.  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 Right frontal ventriculostomy with trans frontal ventricular shunt tube.  Shunt tube tip is in the region of the third ventricle. Ventricles remain decompressed without significant change since the previous study.  Streak artifact from metallic hardware limits visualization of some areas of brain but the visualized brain parenchyma appears to be homogeneous without mass effect or midline shift.  No abnormal extra-axial fluid collections.  Gray-white matter junctions are distinct.  Basal cisterns are not effaced.  No evidence of acute intracranial hemorrhage.  No significant change since previous study.  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  No acute intracranial abnormality.  No significant changes since the previous study.  Original Report Authenticated By: Neale Burly, M.D.   Dg Chest Portable 1 View  04/30/2012  *RADIOLOGY REPORT*  Clinical Data: Headache, chest pain, vomiting and tachycardia.  PORTABLE CHEST - 1 VIEW  Comparison: Chest radiograph performed 62,013  Findings: A ventriculoperitoneal shunt is again noted along the right side of the chest.  The lungs are  relatively well expanded.  Vascular congestion is noted, with mildly increased interstitial markings, possibly reflecting minimal interstitial edema.  No pleural effusion or pneumothorax is seen.  The cardiomediastinal silhouette is borderline enlarged.  No acute osseous abnormalities are identified.  IMPRESSION: Vascular congestion and borderline cardiomegaly, with mildly increased interstitial markings; this could reflect minimal interstitial edema.  Original Report Authenticated By: Santa Lighter, M.D.   Dg Abd Portable 1v  04/25/2012  *RADIOLOGY REPORT*  Clinical Data: Nausea and vomiting.  PORTABLE ABDOMEN - 1 VIEW  Comparison: Portable abdomen x-ray 04/23/2012 dating back to 04/02/2012.  Findings: Bowel gas pattern unremarkable without evidence of obstruction or significant ileus.  Interval decrease in the bowel gas since the examination 2 days ago.  Persistent large stool burden in the cecum, ascending colon, and transverse colon.  No visible opaque urinary tract calculi.  Phleboliths in the right side of the pelvis.  Right-sided ventriculoperitoneal shunt catheter tubing with its tip  in the right upper pelvis.  IMPRESSION: No acute abdominal abnormality.  Large stool burden in the cecum, ascending colon, and transverse colon as noted previously.  Original Report Authenticated By: Deniece Portela, M.D.   Dg Abd Portable 1v  04/23/2012  *RADIOLOGY REPORT*  Clinical Data: Abdominal pain and distention.  Nausea.  PORTABLE ABDOMEN - 1 VIEW 04/23/2012 1848 hours:  Comparison: Portable abdomen x-rays 04/13/2012 dating back to 04/02/2012.  Findings: Bowel gas pattern nonobstructive.  Gas within normal caliber small bowel diffusely.  Very large amount of stool in the cecum, ascending colon, and proximal transverse colon, unchanged. Gas within nondistended descending colon, sigmoid colon, and rectum.  No suggestion of free air on the supine image.  Right- sided ventriculoperitoneal shunt catheter tubing with  its tip in the right side of the upper pelvis.  Regional skeleton unremarkable.  No abnormal calcifications.  IMPRESSION: No acute abdominal abnormality.  Very large stool burden in the cecum, ascending colon, and proximal transverse colon, unchanged from prior examinations.  Original Report Authenticated By: Deniece Portela, M.D.   Dg Abd Portable 1v  04/13/2012  *RADIOLOGY REPORT*  Clinical Data: Panda tube advancement  PORTABLE ABDOMEN - 1 VIEW  Comparison: 04/12/2012  Findings: Weighted feeding tube is looped in the proximal gastric body, kinked at the level of the pylorus, and points back towards the distal gastric body.  Nonobstructive bowel gas pattern.  VP shunt catheter terminating in the right pelvis.  IMPRESSION: Weighted feeding tube is looped/kinked in the stomach and terminates in the distal gastric body, as described above.  Given redundancy, if post pyloric positioning is required, removal and replacement would be necessary.  Original Report Authenticated By: Julian Hy, M.D.   Dg Abd Portable 1v  04/12/2012  *RADIOLOGY REPORT*  Clinical Data: Panda placement  PORTABLE ABDOMEN - 1 VIEW  Comparison: 04/02/2012  Findings: There is nonspecific nonobstructive bowel gas pattern. An  NG feeding tube is noted coiled within stomach with tip in mid stomach. Stable ventriculoperitoneal shunt tubing in the right abdomen.  IMPRESSION: NG feeding tube coiled within stomach with tip in mid stomach.  Original Report Authenticated By: Lahoma Crocker, M.D.    Microbiology: Recent Results (from the past 240 hour(s))  MRSA PCR SCREENING     Status: Abnormal   Collection Time   04/30/12  7:04 PM      Component Value Range Status Comment   MRSA by PCR POSITIVE (*) NEGATIVE Final      Labs: Basic Metabolic Panel:  Lab [id number] 05/02/12 0344 05/01/12 1130 04/30/12 1210 04/30/12 0630 04/30/12 0149 04/27/12 0642  NA -- 140 142 -- 145 148* 145  K -- 3.4* 3.7 -- 3.0* 2.9* 3.1*  CL -- 105 109 --  109 113* 108  CO2 -- 22 22 -- 21 -- 22  GLUCOSE -- 96 132* -- 108* 146* 124*  BUN -- 9 7 -- 8 6 6   CREATININE -- 1.13* 1.06 -- 1.25* 1.70* 1.27*  CALCIUM -- 8.8 8.6 -- 9.3 -- 9.3  MG 4.0* -- 1.1* 1.2* -- -- --  PHOS -- -- -- -- -- -- --   Liver Function Tests: No results found for this basename: AST:5,ALT:5,ALKPHOS:5,BILITOT:5,PROT:5,ALBUMIN:5 in the last 168 hours No results found for this basename: LIPASE:5,AMYLASE:5 in the last 168 hours No results found for this basename: AMMONIA:5 in the last 168 hours CBC:  Lab 05/02/12 0344 04/30/12 0630 04/30/12 0149 04/30/12 0135  WBC 7.9 9.6 -- 10.2  NEUTROABS -- -- -- --  HGB 12.2 12.5 13.9 13.0  HCT 36.9 37.7 41.0 38.2  MCV 84.8 84.7 -- 84.3  PLT 356 328 -- 325   Cardiac Enzymes:  Lab 04/30/12 1929 04/30/12 1210 04/30/12 0402  CKTOTAL 50 65 69  CKMB 2.4 2.6 2.5  CKMBINDEX -- -- --  TROPONINI <0.30 <0.30 <0.30   BNP: BNP (last 3 results)  Basename 03/14/12 0400 03/07/12 0319  PROBNP 1667.0* 7892.0*   CBG: No results found for this basename: GLUCAP:5 in the last 168 hours  Time coordinating discharge: 45  Signed:  Thekla Colborn  [facility] 05/03/2012, 12:31 PM

## 2012-05-03 NOTE — Progress Notes (Signed)
Pt with 23 beats of v-tach after returning to bed from restroom. Pt denies any CP/SOB/palpitations. No obvious distress noted. BP 154/94, HR 89, remains NSR, 98%RA. Will notify MD.

## 2012-05-13 NOTE — Progress Notes (Signed)
Agree with progress note.  [place], [place] DPT [phone]

## 2012-06-05 ENCOUNTER — Other Ambulatory Visit: Payer: Self-pay | Admitting: Internal Medicine

## 2012-06-19 ENCOUNTER — Other Ambulatory Visit: Payer: Self-pay | Admitting: Internal Medicine

## 2012-09-25 ENCOUNTER — Emergency Department (HOSPITAL_COMMUNITY): Payer: No Typology Code available for payment source

## 2012-09-25 ENCOUNTER — Inpatient Hospital Stay (HOSPITAL_COMMUNITY)
Admission: EM | Admit: 2012-09-25 | Discharge: 2012-09-28 | DRG: 683 | Disposition: A | Discharge status: Home Health | Payer: No Typology Code available for payment source | Attending: Internal Medicine | Admitting: Internal Medicine

## 2012-09-25 ENCOUNTER — Encounter (HOSPITAL_COMMUNITY): Payer: Self-pay | Admitting: *Deleted

## 2012-09-25 DIAGNOSIS — I609 Nontraumatic subarachnoid hemorrhage, unspecified: Secondary | ICD-10-CM

## 2012-09-25 DIAGNOSIS — F17200 Nicotine dependence, unspecified, uncomplicated: Secondary | ICD-10-CM | POA: Diagnosis present

## 2012-09-25 DIAGNOSIS — I5032 Chronic diastolic (congestive) heart failure: Secondary | ICD-10-CM

## 2012-09-25 DIAGNOSIS — I10 Essential (primary) hypertension: Secondary | ICD-10-CM

## 2012-09-25 DIAGNOSIS — I129 Hypertensive chronic kidney disease with stage 1 through stage 4 chronic kidney disease, or unspecified chronic kidney disease: Principal | ICD-10-CM | POA: Diagnosis present

## 2012-09-25 DIAGNOSIS — Z91199 Patient's noncompliance with other medical treatment and regimen due to unspecified reason: Secondary | ICD-10-CM

## 2012-09-25 DIAGNOSIS — Z720 Tobacco use: Secondary | ICD-10-CM | POA: Diagnosis present

## 2012-09-25 DIAGNOSIS — R339 Retention of urine, unspecified: Secondary | ICD-10-CM | POA: Diagnosis present

## 2012-09-25 DIAGNOSIS — I701 Atherosclerosis of renal artery: Secondary | ICD-10-CM

## 2012-09-25 DIAGNOSIS — Z9119 Patient's noncompliance with other medical treatment and regimen: Secondary | ICD-10-CM

## 2012-09-25 DIAGNOSIS — E46 Unspecified protein-calorie malnutrition: Secondary | ICD-10-CM

## 2012-09-25 DIAGNOSIS — I5033 Acute on chronic diastolic (congestive) heart failure: Secondary | ICD-10-CM | POA: Diagnosis present

## 2012-09-25 DIAGNOSIS — I252 Old myocardial infarction: Secondary | ICD-10-CM

## 2012-09-25 DIAGNOSIS — Z79899 Other long term (current) drug therapy: Secondary | ICD-10-CM

## 2012-09-25 DIAGNOSIS — E7800 Pure hypercholesterolemia, unspecified: Secondary | ICD-10-CM | POA: Diagnosis present

## 2012-09-25 DIAGNOSIS — I2510 Atherosclerotic heart disease of native coronary artery without angina pectoris: Secondary | ICD-10-CM | POA: Diagnosis present

## 2012-09-25 DIAGNOSIS — Z880 Allergy status to penicillin: Secondary | ICD-10-CM

## 2012-09-25 DIAGNOSIS — N182 Chronic kidney disease, stage 2 (mild): Secondary | ICD-10-CM | POA: Diagnosis present

## 2012-09-25 DIAGNOSIS — I6200 Nontraumatic subdural hemorrhage, unspecified: Secondary | ICD-10-CM

## 2012-09-25 DIAGNOSIS — K5900 Constipation, unspecified: Secondary | ICD-10-CM

## 2012-09-25 DIAGNOSIS — I4891 Unspecified atrial fibrillation: Secondary | ICD-10-CM

## 2012-09-25 DIAGNOSIS — I169 Hypertensive crisis, unspecified: Secondary | ICD-10-CM

## 2012-09-25 DIAGNOSIS — E872 Acidosis: Secondary | ICD-10-CM

## 2012-09-25 DIAGNOSIS — I509 Heart failure, unspecified: Secondary | ICD-10-CM | POA: Diagnosis present

## 2012-09-25 DIAGNOSIS — N189 Chronic kidney disease, unspecified: Secondary | ICD-10-CM | POA: Diagnosis present

## 2012-09-25 LAB — URINALYSIS, ROUTINE W REFLEX MICROSCOPIC
Ketones, ur: NEGATIVE mg/dL
Leukocytes, UA: NEGATIVE
Nitrite: NEGATIVE
Protein, ur: 100 mg/dL — AB
pH: 6 (ref 5.0–8.0)

## 2012-09-25 LAB — CBC WITH DIFFERENTIAL/PLATELET
Basophils Absolute: 0 10*3/uL (ref 0.0–0.1)
Basophils Relative: 0 % (ref 0–1)
MCHC: 33.3 g/dL (ref 30.0–36.0)
Monocytes Absolute: 0.2 10*3/uL (ref 0.1–1.0)
Neutro Abs: 3.4 10*3/uL (ref 1.7–7.7)
Neutrophils Relative %: 62 % (ref 43–77)
Platelets: 233 10*3/uL (ref 150–400)
RDW: 14.3 % (ref 11.5–15.5)
WBC: 5.4 10*3/uL (ref 4.0–10.5)

## 2012-09-25 LAB — BASIC METABOLIC PANEL
Chloride: 107 mEq/L (ref 96–112)
Creatinine, Ser: 1.88 mg/dL — ABNORMAL HIGH (ref 0.50–1.10)
GFR calc Af Amer: 35 mL/min — ABNORMAL LOW (ref >90)
Potassium: 3.4 mEq/L — ABNORMAL LOW (ref 3.5–5.1)
Sodium: 140 mEq/L (ref 135–145)

## 2012-09-25 LAB — MRSA PCR SCREENING: MRSA by PCR: NEGATIVE

## 2012-09-25 MED ORDER — MORPHINE SULFATE 2 MG/ML IJ SOLN: 2.0000 mg | INTRAMUSCULAR | Status: DC | PRN

## 2012-09-25 MED ORDER — FUROSEMIDE 10 MG/ML IJ SOLN
20.0000 mg | Freq: Two times a day (BID) | INTRAMUSCULAR | Status: DC
  Administered 2012-09-25: 20 mg via INTRAVENOUS
  Filled 2012-09-25 (×3): qty 2

## 2012-09-25 MED ORDER — HYDRALAZINE HCL 20 MG/ML IJ SOLN
INTRAMUSCULAR | Status: AC
  Filled 2012-09-25: qty 1

## 2012-09-25 MED ORDER — OXYCODONE HCL 5 MG PO TABS
5.0000 mg | ORAL_TABLET | ORAL | Status: DC | PRN
  Administered 2012-09-26 – 2012-09-28 (×4): 5 mg via ORAL
  Filled 2012-09-25 (×4): qty 1

## 2012-09-25 MED ORDER — SODIUM CHLORIDE 0.9 % IJ SOLN
3.0000 mL | Freq: Two times a day (BID) | INTRAMUSCULAR | Status: DC
  Administered 2012-09-25 – 2012-09-26 (×2): 3 mL via INTRAVENOUS
  Administered 2012-09-26: 10:00:00 via INTRAVENOUS
  Administered 2012-09-27: 3 mL via INTRAVENOUS

## 2012-09-25 MED ORDER — SODIUM CHLORIDE 0.9 % IV SOLN
INTRAVENOUS | Status: DC
  Administered 2012-09-25: 13:00:00 via INTRAVENOUS

## 2012-09-25 MED ORDER — LEVETIRACETAM 500 MG PO TABS
500.0000 mg | ORAL_TABLET | Freq: Two times a day (BID) | ORAL | Status: DC
  Administered 2012-09-25 – 2012-09-28 (×6): 500 mg via ORAL
  Filled 2012-09-25 (×7): qty 1

## 2012-09-25 MED ORDER — MAGNESIUM OXIDE 400 (241.3 MG) MG PO TABS
400.0000 mg | ORAL_TABLET | Freq: Every day | ORAL | Status: DC
  Administered 2012-09-25 – 2012-09-28 (×4): 400 mg via ORAL
  Filled 2012-09-25 (×5): qty 1

## 2012-09-25 MED ORDER — LORAZEPAM 2 MG/ML IJ SOLN
1.0000 mg | Freq: Once | INTRAMUSCULAR | Status: AC
  Administered 2012-09-25: 1 mg via INTRAVENOUS
  Filled 2012-09-25: qty 1

## 2012-09-25 MED ORDER — METOPROLOL TARTRATE 1 MG/ML IV SOLN
INTRAVENOUS | Status: AC
  Filled 2012-09-25: qty 5

## 2012-09-25 MED ORDER — LEVALBUTEROL HCL 0.63 MG/3ML IN NEBU
0.6300 mg | INHALATION_SOLUTION | Freq: Four times a day (QID) | RESPIRATORY_TRACT | Status: DC | PRN
  Filled 2012-09-25: qty 3

## 2012-09-25 MED ORDER — CLONIDINE HCL 0.3 MG PO TABS
0.3000 mg | ORAL_TABLET | Freq: Every day | ORAL | Status: DC
Start: 2012-09-25 — End: 2012-09-25
  Administered 2012-09-25: 0.3 mg via ORAL
  Filled 2012-09-25 (×2): qty 1

## 2012-09-25 MED ORDER — ONDANSETRON HCL 4 MG/2ML IJ SOLN
INTRAMUSCULAR | Status: AC
  Filled 2012-09-25: qty 2

## 2012-09-25 MED ORDER — HYDRALAZINE HCL 20 MG/ML IJ SOLN
10.0000 mg | Freq: Once | INTRAMUSCULAR | Status: AC
  Administered 2012-09-25: 17:00:00 via INTRAVENOUS

## 2012-09-25 MED ORDER — ACETAMINOPHEN 325 MG PO TABS: 650.0000 mg | ORAL_TABLET | Freq: Four times a day (QID) | ORAL | Status: DC | PRN

## 2012-09-25 MED ORDER — METOPROLOL TARTRATE 1 MG/ML IV SOLN
5.0000 mg | Freq: Once | INTRAVENOUS | Status: AC
  Administered 2012-09-25: 5 mg via INTRAVENOUS

## 2012-09-25 MED ORDER — NICARDIPINE HCL IN NACL 20-0.86 MG/200ML-% IV SOLN
5.0000 mg/h | Freq: Once | INTRAVENOUS | Status: AC
  Administered 2012-09-25: 5 mg/h via INTRAVENOUS
  Filled 2012-09-25: qty 200

## 2012-09-25 MED ORDER — NITROGLYCERIN 2 % TD OINT
1.0000 [in_us] | TOPICAL_OINTMENT | Freq: Four times a day (QID) | TRANSDERMAL | Status: DC
  Administered 2012-09-25 – 2012-09-26 (×3): 1 [in_us] via TOPICAL
  Filled 2012-09-25: qty 30

## 2012-09-25 MED ORDER — HYDROMORPHONE HCL PF 1 MG/ML IJ SOLN
1.0000 mg | Freq: Once | INTRAMUSCULAR | Status: AC
  Administered 2012-09-25: 1 mg via INTRAVENOUS
  Filled 2012-09-25: qty 1

## 2012-09-25 MED ORDER — ONDANSETRON HCL 4 MG/2ML IJ SOLN
4.0000 mg | Freq: Four times a day (QID) | INTRAMUSCULAR | Status: DC | PRN
  Administered 2012-09-25: 4 mg via INTRAVENOUS

## 2012-09-25 MED ORDER — MAGNESIUM OXIDE 400 MG PO TABS: 400.0000 mg | ORAL_TABLET | Freq: Every day | ORAL | Status: DC

## 2012-09-25 MED ORDER — NICOTINE 21 MG/24HR TD PT24
21.0000 mg | MEDICATED_PATCH | Freq: Every day | TRANSDERMAL | Status: DC
  Administered 2012-09-25 – 2012-09-28 (×4): 21 mg via TRANSDERMAL
  Filled 2012-09-25 (×4): qty 1

## 2012-09-25 MED ORDER — NICARDIPINE HCL IN NACL 20-0.86 MG/200ML-% IV SOLN
5.0000 mg/h | Freq: Once | INTRAVENOUS | Status: DC
  Filled 2012-09-25: qty 200

## 2012-09-25 MED ORDER — HYDRALAZINE HCL 20 MG/ML IJ SOLN: 10.0000 mg | Freq: Three times a day (TID) | INTRAMUSCULAR | Status: DC | PRN

## 2012-09-25 MED ORDER — ACETAMINOPHEN 650 MG RE SUPP: 650.0000 mg | Freq: Four times a day (QID) | RECTAL | Status: DC | PRN

## 2012-09-25 MED ORDER — CLONIDINE HCL 0.3 MG PO TABS
0.3000 mg | ORAL_TABLET | Freq: Three times a day (TID) | ORAL | Status: DC
  Administered 2012-09-25 – 2012-09-27 (×5): 0.3 mg via ORAL
  Filled 2012-09-25 (×7): qty 1

## 2012-09-25 MED ORDER — METOPROLOL TARTRATE 1 MG/ML IV SOLN
5.0000 mg | Freq: Four times a day (QID) | INTRAVENOUS | Status: DC
  Administered 2012-09-26 (×2): 5 mg via INTRAVENOUS
  Filled 2012-09-25 (×6): qty 5

## 2012-09-25 MED ORDER — PANTOPRAZOLE SODIUM 40 MG PO TBEC
40.0000 mg | DELAYED_RELEASE_TABLET | Freq: Every day | ORAL | Status: DC
  Administered 2012-09-26 – 2012-09-28 (×3): 40 mg via ORAL
  Filled 2012-09-25 (×3): qty 1

## 2012-09-25 NOTE — Progress Notes (Signed)
eLink Physician-Brief Progress Note Patient Name: [name] DOB: 1961/01/13 MRN: [id number]  Date of Service  09/25/2012   HPI/Events of Note   I was called by nursing staff; theyr tried to page Dr. [place] Pink 4 times and there was no answer; the patient admitted with the diagnosis of Hypertensive urgency on Cardene drip; now completed; BP increasing 181/105   eICU Interventions  I will order the Cardene drip for now;while trying to page Dr. Beryle Quant 09/25/2012, 4:18 PM

## 2012-09-25 NOTE — ED Provider Notes (Signed)
History     CSN: [id number]  Arrival date & time 09/25/12  1156   First MD Initiated Contact with Patient 09/25/12 1220      Chief Complaint  Patient presents with  . Headache    (Consider location/radiation/quality/duration/timing/severity/associated sxs/prior treatment) Patient is a 51 y.o. female presenting with headaches. The history is provided by the patient.  Headache    patient here with headache since yesterday associated nausea no vomiting. Patient of her antihypertensive medications x3 days. She's had a cerebral aneurysm clipping done in the past as well as she had to be residual cerebral aneurysms for which she should have surgery for it she has not been compliant. These are diagnosed in June of this year. Denies any neck pain or photophobia. No chest pain chest pressure. Of fever or chills. No medications taken prior to arrival. Nothing makes her symptoms worse  Past Medical History  Diagnosis Date  . HTN (hypertension)     resistant  . Hypercholesterolemia   . Tobacco abuse     resumed smoking half a pack a day. She was a smoker in the past and states she was abl eto stop in the past using nicotine patches  . CAD (coronary artery disease)     Pt has St elevation MI in Feb 2009. Left hear tcath at tha ttime showed a 70% distal LAD lesion that was hazy consistent with a plaque rupture. She had a 50% distal circumflex stenosis an a 95% distal RCA stenosis. She did havve drug eluting stent placed in her distal RCA  . Chronic kidney disease     most recent creatinine 1.4 in 3/10  . Intracranial hemorrhage, spontaneous intraparenchymal, idiopathic, remote, resolved   . Diastolic heart failure     pt most recent echo was in Feb 2009 w an EF of 60% and severe left ventricular hypertrophy. The pt did have evidence of diastolic dysfunction. The RV appeared normal  . Subarachnoid hemorrhage     03/2012    Past Surgical History  Procedure Date  . Ventriculoperitoneal shunt  03/27/2012    Procedure: SHUNT INSERTION VENTRICULAR-PERITONEAL;  Surgeon: Winfield Cunas, MD;  Location: [facility];  Service: Neurosurgery;  Laterality: N/A;  Ventricular-Peritoneal Shunt Insertion  . Cardiac catheterization   . Coronary angioplasty   . Abdominal hysterectomy     Family History  Problem Relation Age of Onset  . Coronary artery disease Other     premature in 1st degree relatives    History  Substance Use Topics  . Smoking status: Current Every Day Smoker -- 0.5 packs/day for 35 years  . Smokeless tobacco: Never Used     Comment: 1/2 ppd   . Alcohol Use: No    OB History    Grav Para Term Preterm Abortions TAB SAB Ect Mult Living                  Review of Systems  Neurological: Positive for headaches.  All other systems reviewed and are negative.    Allergies  Ampicillin and Penicillins  Home Medications   Current Outpatient Rx  Name  Route  Sig  Dispense  Refill  . LEVETIRACETAM 500 MG PO TABS   Oral   Take 1 tablet (500 mg total) by mouth 2 (two) times daily.   60 tablet   0   . LOSARTAN POTASSIUM 50 MG PO TABS   Oral   Take 1 tablet (50 mg total) by mouth 2 (two) times daily.  60 tablet   0   . MAGNESIUM OXIDE 400 MG PO TABS   Oral   Take 1 tablet (400 mg total) by mouth daily.   30 tablet   0   . METOCLOPRAMIDE HCL 10 MG PO TABS   Oral   Take 1 tablet (10 mg total) by mouth 3 (three) times daily as needed (nausea).   30 tablet   0   . METOPROLOL TARTRATE 25 MG PO TABS   Oral   Take 3 tablets (75 mg total) by mouth 2 (two) times daily.   60 tablet   0   . NITROGLYCERIN 0.4 MG SL SUBL   Sublingual   Place 1 tablet (0.4 mg total) under the tongue every 5 (five) minutes as needed for chest pain. For chest pain   10 tablet   0   . OMEPRAZOLE 20 MG PO CPDR   Oral   Take 1 capsule (20 mg total) by mouth daily.         Marland Kitchen POTASSIUM CHLORIDE ER 10 MEQ PO TBCR   Oral   Take 2 tablets (20 mEq total) by mouth 2 (two) times  daily.   30 tablet   0     BP 280/160  Pulse 78  Temp 98.2 F (36.8 C) (Oral)  Resp 18  SpO2 94%  Physical Exam  Nursing note and vitals reviewed. Constitutional: She is oriented to person, place, and time. She appears well-developed and well-nourished.  Non-toxic appearance. No distress.  HENT:  Head: Normocephalic and atraumatic.  Eyes: Conjunctivae normal, EOM and lids are normal. Pupils are equal, round, and reactive to light.  Neck: Normal range of motion. Neck supple. No tracheal deviation present. No mass present.  Cardiovascular: Normal rate, regular rhythm and normal heart sounds.  Exam reveals no gallop.   No murmur heard. Pulmonary/Chest: Effort normal and breath sounds normal. No stridor. No respiratory distress. She has no decreased breath sounds. She has no wheezes. She has no rhonchi. She has no rales.  Abdominal: Soft. Normal appearance and bowel sounds are normal. She exhibits no distension. There is no tenderness. There is no rebound and no CVA tenderness.  Musculoskeletal: Normal range of motion. She exhibits no edema and no tenderness.  Neurological: She is alert and oriented to person, place, and time. She has normal strength. No cranial nerve deficit or sensory deficit. GCS eye subscore is 4. GCS verbal subscore is 5. GCS motor subscore is 6.  Skin: Skin is warm and dry. No abrasion and no rash noted.  Psychiatric: She has a normal mood and affect. Her speech is normal and behavior is normal.    ED Course  Procedures (including critical care time)   Labs Reviewed  CBC WITH DIFFERENTIAL  BASIC METABOLIC PANEL   No results found.   No diagnosis found.    MDM   Date: 09/25/2012  Rate: 76  Rhythm: normal sinus rhythm  QRS Axis: normal  Intervals: normal  ST/T Wave abnormalities: nonspecific T wave changes  Conduction Disutrbances:none  Narrative Interpretation:   Old EKG Reviewed: none available  2:21 PM Patient given pain medication for her  headache feels better. She was started on a Cardene care for hypertensive crisis and this was titrated to keep her diastolic below [id number]. Her neurological exam was repeated and remains stable. Head CT was negative. Her nausea has been controlled. Her headache after the medications is now 5/10. Have spoken with the hospitalist and she'll be admitted to [facility]   .  CRITICAL CARE Performed by: Leota Jacobsen   Total critical care time: 75  Critical care time was exclusive of separately billable procedures and treating other patients.  Critical care was necessary to treat or prevent imminent or life-threatening deterioration.  Critical care was time spent personally by me on the following activities: development of treatment plan with patient and/or surrogate as well as nursing, discussions with consultants, evaluation of patient's response to treatment, examination of patient, obtaining history from patient or surrogate, ordering and performing treatments and interventions, ordering and review of laboratory studies, ordering and review of radiographic studies, pulse oximetry and re-evaluation of patient's condition.   Leota Jacobsen, MD 09/25/12 [phone]

## 2012-09-25 NOTE — ED Notes (Addendum)
Pt states she began having a severe headache @ 3 hours ago.  Pt denies N/V.  Pt has been out of her BP meds for 3 - 4 days (pt unable to provide name).  No facial droop, no arm drift, grips = bilat.  Pt denies generalized weakness or unilateral weakness. Pt denies dizziness.

## 2012-09-25 NOTE — ED Notes (Signed)
Went to collect labs - IV team trying to place IV - RN to notify me when i can draw labs.

## 2012-09-25 NOTE — ED Notes (Signed)
Patient transported to CT 

## 2012-09-25 NOTE — H&P (Signed)
[facility] History and Physical  [name] [id number] DOB: 27-Jul-1961 DOA: 09/25/2012  Referring physician: Vivi Martens, ER physician PCP: Richardson Dopp, PA Specialists: None  Chief Complaint: Headache  HPI: [name] is a 51 y.o. female  With past medical history of malignant hypertension, CAD, subarachnoid hemorrhage with multiple intracranial aneurysms as well as noncompliance to stop taking her blood pressure medication several days ago. She does been quite noncompliant and followups. She came to emergency room today complaining of headache. She was noted to have a blood pressure systolic around [id number]. CT scan of the head was unremarkable for any acute findings including bleed or infarct. Patient was given Ativan, Dilaudid and started on a Cardene drip in the emergency room. Her pressures came down into the 190s. Hospitals were called for further evaluation, treatment and admission.  Review of Systems: Patient was transferred to step down unit. I saw her after she arrived in the unit. He previously had been complaining of nausea and threw up some pills that were given to her in the step down unit. She complained of a mild headache, but was feeling better. Her main complaint is of fatigue. She denied any vision changes, dysphasia, chest pain, palpitations or shortness of breath, wheeze, cough, abdominal pain, hematuria, dysuria, constipation, diarrhea, focal extremity numbness or weakness or pain. Review systems otherwise negative.  Past Medical History  Diagnosis Date  . HTN (hypertension)     resistant  . Hypercholesterolemia   . Tobacco abuse     resumed smoking half a pack a day. She was a smoker in the past and states she was abl eto stop in the past using nicotine patches  . CAD (coronary artery disease)     Pt has St elevation MI in Feb 2009. Left hear tcath at tha ttime showed a 70% distal LAD lesion that was hazy consistent with a plaque rupture. She had a 50% distal  circumflex stenosis an a 95% distal RCA stenosis. She did havve drug eluting stent placed in her distal RCA  . Chronic kidney disease     most recent creatinine 1.4 in 3/10  . Intracranial hemorrhage, spontaneous intraparenchymal, idiopathic, remote, resolved   . Diastolic heart failure     pt most recent echo was in Feb 2009 w an EF of 60% and severe left ventricular hypertrophy. The pt did have evidence of diastolic dysfunction. The RV appeared normal  . Subarachnoid hemorrhage     03/2012   Past Surgical History  Procedure Date  . Ventriculoperitoneal shunt 03/27/2012    Procedure: SHUNT INSERTION VENTRICULAR-PERITONEAL;  Surgeon: Winfield Cunas, MD;  Location: [place] [facility];  Service: Neurosurgery;  Laterality: N/A;  Ventricular-Peritoneal Shunt Insertion  . Cardiac catheterization   . Coronary angioplasty   . Abdominal hysterectomy    Social History:  reports that she has been smoking.  She has never used smokeless tobacco. She reports that she does not drink alcohol or use illicit drugs. Patient lives at home with family. She was able to stay in most activities of daily living without assistance.  Allergies  Allergen Reactions  . Ampicillin Other (See Comments)    unknown  . Penicillins Other (See Comments)    unknown    Family History  Problem Relation Age of Onset  . Coronary artery disease Other     premature in 1st degree relatives   diabetes mellitus  Prior to Admission medications   Medication Sig Start Date End Date Taking? Authorizing Provider  levETIRAcetam (  KEPPRA) 500 MG tablet Take 500 mg by mouth 2 (two) times daily. 04/27/12 04/27/13 Yes Sorin June Leap, MD  losartan (COZAAR) 50 MG tablet Take 50 mg by mouth 2 (two) times daily. 05/03/12 05/03/13 Yes Jessica U Vann, DO  magnesium oxide (MAG-OX) 400 MG tablet Take 400 mg by mouth daily. 05/03/12 05/03/13 Yes Jessica U Vann, DO  metoprolol tartrate (LOPRESSOR) 25 MG tablet Take 75 mg by mouth 2 (two) times daily. 05/03/12   Yes Geradine Girt, DO  nitroGLYCERIN (NITROSTAT) 0.4 MG SL tablet Place 0.4 mg under the tongue every 5 (five) minutes as needed. For chest pain 04/27/12  Yes Sorin June Leap, MD  omeprazole (PRILOSEC) 20 MG capsule Take 20 mg by mouth daily. 04/27/12 04/27/13 Yes Sorin June Leap, MD  potassium chloride (K-DUR) 10 MEQ tablet Take 2 tablets (20 mEq total) by mouth 2 (two) times daily. 05/03/12 05/03/13 Yes Jessica U Vann, DO  metoCLOPramide (REGLAN) 10 MG tablet Take 1 tablet (10 mg total) by mouth 3 (three) times daily as needed (nausea). 04/27/12 05/07/12  Sorin June Leap, MD   Physical Exam: Filed Vitals:   09/25/12 1700 09/25/12 1730 09/25/12 1800 09/25/12 1900  BP: 189/106 192/96 202/112 181/103  Pulse: 79 80 97 80  Temp:      TempSrc:      Resp: 17 17 17 13   Height:      Weight:      SpO2: 100%  100% 100%     General:  Alert and oriented x3, fatigue, looks older than stated age  Eyes: Sclera nonicteric, extraocular movements are intact  ENT: Normocephalic, atraumatic, mucous members are dry  Neck: Neck is supple no evidence of thyromegaly, cannot appreciate any JVD  Cardiovascular: Regular rate and rhythm, Q000111Q, soft 2/6 systolic ejection murmur  Respiratory: Clear to auscultation bilaterally  Abdomen: Soft, nontender, nondistended, positive bowel sounds  Skin: Skin breaks, tears or lesions  Musculoskeletal: No clubbing or cyanosis, trace pitting edema  Psychiatric: Patient's affect is slightly needed. Otherwise no evidence of any psychoses, patient is appropriate  Neurologic: No focal deficits.  Labs on Admission:  Basic Metabolic Panel:  Lab [id number] 1333  NA 140  K 3.4*  CL 107  CO2 23  GLUCOSE 96  BUN 18  CREATININE 1.88*  CALCIUM 9.1  MG --  PHOS --   CBC:  Lab 09/25/12 1333  WBC 5.4  NEUTROABS 3.4  HGB 14.9  HCT 44.7  MCV 85.0  PLT 233    BNP (last 3 results)  Basename 03/14/12 0400 03/07/12 0319  PROBNP 1667.0* 7892.0*    Radiological Exams on  Admission: Ct Head Wo Contrast  09/25/2012 IMPRESSION: No evidence of acute intracranial abnormality.  Postoperative changes of cerebral aneurysm clipping, unchanged right frontal ventriculostomy catheter and remote left cerebellar infarct.   Original Report Authenticated By: Margarette Canada, M.D.     EKG: Independently reviewed. Normal sinus rhythm with left atrial enlargement, left ventricular hypertrophy  Assessment/Plan Principal Problem:  *Hypertension, accelerated: Secondary noncompliance plus likely underlying acute heart failure. Have started IV Lasix plus nitro paste plus IV Lopressor plus when necessary hydralazine +3 times a day clonidine Active Problems:   CAD, NATIVE VESSEL: Currently stable. Troponin normal.   DIASTOLIC HEART FAILURE, acute on chronic: Suspected. Check BNP. Patient noncompliant with multiple aspects of her health care including weight checks, and diet restriction and medication taking. Have started IV Lasix and will check daily weights and strict I.'s and O.'s.  Acute renal failure  in the setting of stage II chronic kidney disease: Suspect with normal BUN that actually her creatinine is likely now up to stage III and this may be more end organ damage from elevated blood pressure.   Noncompliance: Counseled   Tobacco abuse: Counseled/.     Code Status: Full code (must indicate code status--if unknown or must be presumed, indicate so) Family Communication: Left message with daughter. She was here earlier and is not now. Disposition Plan: Hopefully can get her blood pressure better controlled by the morning can transfer out of the step down unit. Likely be in the hospital for several days  Time spent: 45 minutes  [facility] Pager [phone]  If 7PM-7AM, please contact night-coverage [email or web address] Password [email or web address] 09/25/2012, 7:09 PM

## 2012-09-26 DIAGNOSIS — I609 Nontraumatic subarachnoid hemorrhage, unspecified: Secondary | ICD-10-CM

## 2012-09-26 DIAGNOSIS — F17200 Nicotine dependence, unspecified, uncomplicated: Secondary | ICD-10-CM

## 2012-09-26 DIAGNOSIS — K5900 Constipation, unspecified: Secondary | ICD-10-CM

## 2012-09-26 DIAGNOSIS — I10 Essential (primary) hypertension: Secondary | ICD-10-CM

## 2012-09-26 DIAGNOSIS — E872 Acidosis: Secondary | ICD-10-CM

## 2012-09-26 LAB — BASIC METABOLIC PANEL
BUN: 19 mg/dL (ref 6–23)
Chloride: 107 mEq/L (ref 96–112)
Creatinine, Ser: 2.09 mg/dL — ABNORMAL HIGH (ref 0.50–1.10)
Glucose, Bld: 93 mg/dL (ref 70–99)
Potassium: 3.6 mEq/L (ref 3.5–5.1)

## 2012-09-26 MED ORDER — ISOSORBIDE MONONITRATE ER 30 MG PO TB24
30.0000 mg | ORAL_TABLET | Freq: Every day | ORAL | Status: DC
  Administered 2012-09-26 – 2012-09-27 (×2): 30 mg via ORAL
  Filled 2012-09-26 (×2): qty 1

## 2012-09-26 MED ORDER — METOPROLOL TARTRATE 100 MG PO TABS: 100.0000 mg | ORAL_TABLET | Freq: Two times a day (BID) | ORAL | Status: DC

## 2012-09-26 MED ORDER — METOPROLOL TARTRATE 50 MG PO TABS
75.0000 mg | ORAL_TABLET | Freq: Two times a day (BID) | ORAL | Status: DC
  Administered 2012-09-26 – 2012-09-27 (×3): 75 mg via ORAL
  Filled 2012-09-26 (×4): qty 1

## 2012-09-26 MED ORDER — FUROSEMIDE 10 MG/ML IJ SOLN
INTRAMUSCULAR | Status: AC
  Filled 2012-09-26: qty 4

## 2012-09-26 MED ORDER — HYDRALAZINE HCL 20 MG/ML IJ SOLN
10.0000 mg | Freq: Four times a day (QID) | INTRAMUSCULAR | Status: DC | PRN
  Administered 2012-09-26 – 2012-09-28 (×4): 10 mg via INTRAVENOUS
  Filled 2012-09-26 (×4): qty 1

## 2012-09-26 MED ORDER — METOPROLOL TARTRATE 50 MG PO TABS: 75.0000 mg | ORAL_TABLET | Freq: Two times a day (BID) | ORAL | Status: DC

## 2012-09-26 NOTE — Progress Notes (Signed)
Patient ID: [name]  female  [id number]    DOB: 03/12/1961    DOA: 09/25/2012  PCP: Richardson Dopp, PA  Assessment/Plan: Principal Problem:  *Hypertension, accelerated: likely sec to non-compliance, per patient ran out of her meds a week ago and doesnot remember all their names, off cardene drip  - BP now 154/86, DC scheduled IV Lopressor and nitro paste. DC IV Lasix (lung exam clear, creatinine trending up) - Restart home dose of oral Lopressor, place on Imdur - Cannot start Cozaar and Lasix due to AKI on CKD, creatinine trending up  - Patient's medications appears to be in discrepancy from previous discharge summary, her verbal report and current listed meds, appreciate pharmacy for verifying the medications. Patient has not filled up her medications since July this year per [facility] pharmacist.   Active Problems:  CAD, NATIVE VESSEL: Currently no chest pain, she also has a history of A. 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   Tobacco abuse: cont nicotine patch  DVT Prophylaxis: SCD's  Code Status: Full  Disposition: transfer out of SDU if BP remains stable. Hopefully DC home tomorrow    Subjective: Denies any specific complaints, chest pain, shortness of breath, no nausea or vomiting.  Objective: Weight change:   Intake/Output Summary (Last 24 hours) at 09/26/12 0826 Last data filed at 09/26/12 0600  Gross per 24 hour   Intake   1730 ml  Output   1340 ml  Net    390 ml   Blood pressure 153/86, pulse 65, temperature 98.4 F (36.9 C), temperature source Oral, resp. rate 17, height 5\' 1"  (1.549 m), weight 76.4 kg (168 lb 6.9 oz), SpO2 99.00%.  Physical Exam: General: Alert and awake, oriented x3, not in any acute distress. HEENT: anicteric sclera, pupils reactive to light and accommodation, EOMI CVS: S1-S2 clear, 2/6 SEM Chest: clear to auscultation bilaterally, no wheezing, rales or rhonchi Abdomen: soft nontender, nondistended, normal bowel sounds, no organomegaly Extremities: no cyanosis, clubbing or edema noted bilaterally Neuro: Cranial nerves II-XII intact, no focal neurological deficits  Lab Results: Basic Metabolic Panel:  Lab [id number] 0404 09/25/12 1333  NA 139 140  K 3.6 3.4*  CL 107 107  CO2 22 23  GLUCOSE 93 96  BUN 19 18  CREATININE 2.09* 1.88*  CALCIUM 8.8 9.1  MG -- --  PHOS -- --   Liver Function Tests: No results found for this basename: AST:2,ALT:2,ALKPHOS:2,BILITOT:2,PROT:2,ALBUMIN:2 in the last 168 hours No results found for this basename: LIPASE:2,AMYLASE:2 in the last 168 hours No results found for this basename: AMMONIA:2 in the last 168 hours CBC:  Lab 09/25/12 1333  WBC 5.4  NEUTROABS 3.4  HGB 14.9  HCT 44.7  MCV 85.0  PLT 233   Cardiac Enzymes: No results found for this basename: CKTOTAL:3,CKMB:3,CKMBINDEX:3,TROPONINI:3 in the last 168 hours BNP: No components found with this basename: POCBNP:2 CBG: No results found for this basename: GLUCAP:5 in the last 168 hours   Micro Results:  Recent Results (from the past 240 hour(s))  MRSA PCR SCREENING     Status: Normal   Collection Time   09/25/12  3:34 PM      Component Value Range Status Comment   MRSA by PCR NEGATIVE  NEGATIVE Final     Studies/Results: Ct Head Wo Contrast  09/25/2012  *RADIOLOGY REPORT*  Clinical Data: 51 year old female with headache.  History of cerebral aneurysm clipping.  CT  HEAD WITHOUT CONTRAST  Technique:  Contiguous axial images were obtained from the base of the skull through the vertex without contrast.  Comparison: 04/12/2012 CT  Findings: Postoperative changes in the right frontotemporal region with aneurysm clips in the right MCA and circle Willis regions again noted. The right frontal ventriculostomy catheter is again identified with the tip in the region of the posterior third ventricle. A remote infarct within the left cerebellum is again noted.  No acute intracranial abnormalities are identified, including mass lesion or mass effect, hydrocephalus, extra-axial fluid collection, midline shift, hemorrhage, or acute infarction.  The visualized bony calvarium is unremarkable.  IMPRESSION: No evidence of acute intracranial abnormality.  Postoperative changes of cerebral aneurysm clipping, unchanged right frontal ventriculostomy catheter and remote left cerebellar infarct.   Original Report Authenticated By: Margarette Canada, M.D.     Medications: Scheduled Meds:   . cloNIDine  0.3 mg Oral TID  . [EXPIRED] hydrALAZINE      . [COMPLETED] hydrALAZINE  10 mg Intravenous Once  . [COMPLETED] HYDROmorphone  1 mg Intravenous Once  . isosorbide mononitrate  30 mg Oral Daily  . levETIRAcetam  500 mg Oral BID  . [COMPLETED] LORazepam  1 mg Intravenous Once  . magnesium oxide  400 mg Oral Daily  . metoprolol      . [COMPLETED] metoprolol  5 mg Intravenous Once  . metoprolol tartrate  75 mg Oral BID  . [COMPLETED] niCARDipine  5 mg/hr Intravenous Once  . nicotine  21 mg Transdermal Daily  . [EXPIRED] ondansetron      . pantoprazole  40 mg Oral Daily  . sodium chloride  3 mL Intravenous Q12H  . [DISCONTINUED] cloNIDine  0.3 mg Oral Daily  . [DISCONTINUED] furosemide  20 mg Intravenous BID  . [DISCONTINUED] magnesium oxide  400 mg Oral Daily  . [DISCONTINUED] metoprolol  5 mg Intravenous Q6H  . [DISCONTINUED] metoprolol tartrate  100 mg Oral BID  . [DISCONTINUED]  metoprolol tartrate  75 mg Oral BID  . [DISCONTINUED] niCARDipine  5 mg/hr Intravenous Once  . [DISCONTINUED] nitroGLYCERIN  1 inch Topical Q6H      LOS: 1 day   Davion Meara M.D. [facility] 09/26/2012, 8:26 AM Pager: [phone]  If 7PM-7AM, please contact night-coverage [email or web address] Password [email or web address]

## 2012-09-27 LAB — CBC
HCT: 41.2 % (ref 36.0–46.0)
MCV: 86.2 fL (ref 78.0–100.0)
Platelets: 212 10*3/uL (ref 150–400)
RBC: 4.78 MIL/uL (ref 3.87–5.11)
RDW: 14.8 % (ref 11.5–15.5)
WBC: 6.2 10*3/uL (ref 4.0–10.5)

## 2012-09-27 LAB — BASIC METABOLIC PANEL
CO2: 22 mEq/L (ref 19–32)
Chloride: 106 mEq/L (ref 96–112)
Creatinine, Ser: 2.56 mg/dL — ABNORMAL HIGH (ref 0.50–1.10)
GFR calc Af Amer: 24 mL/min — ABNORMAL LOW (ref >90)
Potassium: 3.7 mEq/L (ref 3.5–5.1)

## 2012-09-27 LAB — URINALYSIS, ROUTINE W REFLEX MICROSCOPIC
Glucose, UA: NEGATIVE mg/dL
Ketones, ur: NEGATIVE mg/dL
Leukocytes, UA: NEGATIVE
Nitrite: NEGATIVE
Specific Gravity, Urine: 1.01 (ref 1.005–1.030)
pH: 5.5 (ref 5.0–8.0)

## 2012-09-27 LAB — DRUGS OF ABUSE SCREEN W/O ALC, ROUTINE URINE
Amphetamine Screen, Ur: NEGATIVE
Barbiturate Quant, Ur: NEGATIVE
Benzodiazepines.: NEGATIVE
Phencyclidine (PCP): NEGATIVE

## 2012-09-27 MED ORDER — SODIUM CHLORIDE 0.9 % IV BOLUS (SEPSIS)
250.0000 mL | Freq: Once | INTRAVENOUS | Status: AC
  Administered 2012-09-27: 250 mL via INTRAVENOUS

## 2012-09-27 MED ORDER — CLONIDINE HCL 0.3 MG PO TABS: 0.3000 mg | ORAL_TABLET | Freq: Three times a day (TID) | ORAL | Status: DC

## 2012-09-27 MED ORDER — LEVETIRACETAM 500 MG PO TABS: 500.0000 mg | ORAL_TABLET | Freq: Two times a day (BID) | ORAL | Status: DC

## 2012-09-27 MED ORDER — METOPROLOL TARTRATE 100 MG PO TABS
100.0000 mg | ORAL_TABLET | Freq: Two times a day (BID) | ORAL | Status: DC
  Administered 2012-09-27 – 2012-09-28 (×2): 100 mg via ORAL
  Filled 2012-09-27 (×3): qty 1

## 2012-09-27 MED ORDER — ISOSORBIDE MONONITRATE ER 60 MG PO TB24
60.0000 mg | ORAL_TABLET | Freq: Every day | ORAL | Status: DC
Start: 2012-09-28 — End: 2012-09-28
  Administered 2012-09-28: 60 mg via ORAL
  Filled 2012-09-27: qty 1

## 2012-09-27 MED ORDER — ATORVASTATIN CALCIUM 80 MG PO TABS: 80.0000 mg | ORAL_TABLET | Freq: Every day | ORAL | Status: DC

## 2012-09-27 MED ORDER — ISOSORBIDE MONONITRATE ER 30 MG PO TB24: 30.0000 mg | ORAL_TABLET | Freq: Every day | ORAL | Status: DC

## 2012-09-27 MED ORDER — SODIUM CHLORIDE 0.9 % IV SOLN
INTRAVENOUS | Status: AC
  Administered 2012-09-27: 19:00:00 via INTRAVENOUS
  Administered 2012-09-28: 75 mL via INTRAVENOUS

## 2012-09-27 MED ORDER — OMEPRAZOLE 20 MG PO CPDR: 20.0000 mg | DELAYED_RELEASE_CAPSULE | Freq: Every day | ORAL | Status: DC

## 2012-09-27 MED ORDER — HYDRALAZINE HCL 50 MG PO TABS
50.0000 mg | ORAL_TABLET | Freq: Three times a day (TID) | ORAL | Status: DC
Start: 2012-09-27 — End: 2012-09-28
  Administered 2012-09-27 – 2012-09-28 (×3): 50 mg via ORAL
  Filled 2012-09-27 (×5): qty 1

## 2012-09-27 MED ORDER — METOPROLOL TARTRATE 25 MG PO TABS: 75.0000 mg | ORAL_TABLET | Freq: Two times a day (BID) | ORAL | Status: DC

## 2012-09-27 MED ORDER — LABETALOL HCL 200 MG PO TABS
200.0000 mg | ORAL_TABLET | Freq: Three times a day (TID) | ORAL | Status: DC
  Administered 2012-09-27 – 2012-09-28 (×3): 200 mg via ORAL
  Filled 2012-09-27 (×5): qty 1

## 2012-09-27 MED ORDER — ISOSORBIDE MONONITRATE ER 30 MG PO TB24
30.0000 mg | ORAL_TABLET | Freq: Once | ORAL | Status: AC
  Administered 2012-09-27: 30 mg via ORAL
  Filled 2012-09-27: qty 1

## 2012-09-27 NOTE — Evaluation (Signed)
Physical Therapy Evaluation Patient Details Name: [name] MRN: [id number] DOB: 1961-07-28 Today's Date: 09/27/2012 Time: [id number] PT Time Calculation (min): 12 min  PT Assessment / Plan / Recommendation Clinical Impression  Pt was admitted with  hypertension, noncompliance with medications. Pt. is ambulatory, is weak today but did ambulate in hall with 1 person assist. Pt. may be DC'd today per RN.. no f/u  PT indicated. continue while in acute care to improve strength.   PT Assessment  Patient needs continued PT services    Follow Up Recommendations  No PT follow up    Does the patient have the potential to tolerate intense rehabilitation      Barriers to Discharge Decreased caregiver support      Equipment Recommendations  None recommended by PT    Recommendations for Other Services     Frequency Min 3X/week    Precautions / Restrictions Precautions Precautions: Fall   Pertinent Vitals/Pain After ambulation 190/99 RN aware.      Mobility  Bed Mobility Bed Mobility: Supine to Sit;Sit to Supine Supine to Sit: 7: Independent Sit to Supine: 7: Independent Transfers Transfers: Sit to Stand;Stand to Sit Sit to Stand: 5: Supervision Stand to Sit: 7: Independent Ambulation/Gait Ambulation/Gait Assistance: 4: Min guard Ambulation Distance (Feet): 125 Feet Assistive device: 1 person hand held assist Ambulation/Gait Assistance Details: Pt is ambulating very slowly,   gait is steady.  Gait Pattern: Step-through pattern Gait velocity: decreased    Shoulder Instructions     Exercises     PT Diagnosis: Difficulty walking;Generalized weakness  PT Problem List: Decreased strength;Decreased activity tolerance;Decreased mobility;Decreased knowledge of precautions PT Treatment Interventions: DME instruction;Gait training;Functional mobility training;Therapeutic activities;Patient/family education   PT Goals Acute Rehab PT Goals PT Goal Formulation: With patient Time  For Goal Achievement: 10/04/12 Potential to Achieve Goals: Good Pt will Ambulate: >150 feet;Independently PT Goal: Ambulate - Progress: Goal set today  Visit Information  Last PT Received On: 09/27/12 Assistance Needed: +1    Subjective Data  Subjective: I am just so weak Patient Stated Goal: to go home and get in by bed.   Prior Functioning  Home Living Lives With: Family Available Help at Discharge: Available PRN/intermittently Type of Home: House Home Access: Stairs to enter [employer] of Steps: 3 with wall eside them Home Layout: One level Bathroom Shower/Tub: Chiropodist: Standard Home Adaptive Equipment: None Prior Function Comments: Per pt, daughter and children intermittently there. Communication Communication: No difficulties    Cognition  Overall Cognitive Status: Appears within functional limits for tasks assessed/performed Arousal/Alertness: Lethargic Orientation Level: Time Behavior During Session: Flat affect    Extremity/Trunk Assessment Right Upper Extremity Assessment RUE ROM/Strength/Tone: [facility] for tasks assessed Left Upper Extremity Assessment LUE ROM/Strength/Tone: WFL for tasks assessed Right Lower Extremity Assessment RLE ROM/Strength/Tone: [facility] for tasks assessed Left Lower Extremity Assessment LLE ROM/Strength/Tone: WFL for tasks assessed   Balance Static Standing Balance Static Standing - Level of Assistance: 7: Independent  End of Session PT - End of Session Activity Tolerance: Patient limited by fatigue Patient left: in bed;with call bell/phone within reach;with bed alarm set;with family/visitor present Nurse Communication: Mobility status  GP     Claretha Cooper 09/27/2012, 12:09 PM  [phone]

## 2012-09-27 NOTE — Progress Notes (Signed)
Pt has been unable to void all day since removal of foley catheter .Bladder scan revealed 140 cc urine.  Notified MD Rai regarding u/o.  Received new orders for 250 cc bolus and to push PO fluids.  Will continue to monitor urine output.

## 2012-09-27 NOTE — Progress Notes (Signed)
Patient ID: [name]  female  [id number]    DOB: 02-Jan-1961    DOA: 09/25/2012  PCP: No primary provider on file.  Assessment/Plan: Principal Problem:  *Hypertension, accelerated: likely sec to non-compliance, per patient ran out of her meds a week ago and doesnot remember all their names, off cardene drip. BP still elevated. Initial plan was to DC home today but patient's BP went up again to 200's.   - Continue oral Lopressor, added hydralazine, increased Imdur, added labetalol. DC clonidine due to risk of withdrawal HTN if patient remains noncompliant  - Cannot start Cozaar and Lasix due to AKI on CKD, creatinine trending up   Active Problems:  CAD, NATIVE VESSEL: Currently no chest pain, she also has a history of A. 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   Tobacco abuse: cont nicotine patch  DVT Prophylaxis: SCD's  Code Status: Full  Disposition: Hopefully DC home tomorrow    Subjective: Denies any specific complaints, chest pain, shortness of breath, no nausea or vomiting.  Objective: Weight change: 2 kg (4 lb 6.5 oz)  Intake/Output Summary (Last 24 hours) at 09/27/12 1557 Last data filed at 09/27/12 0833  Gross per 24 hour  Intake    440 ml  Output    675 ml  Net   -235 ml   Blood pressure 158/90, pulse 63, temperature 97.6 F (36.4 C), temperature source Oral, resp. rate 16, height 5\' 1"  (1.549 m), weight 76.8  kg (169 lb 5 oz), SpO2 99.00%.  Physical Exam: General: Alert and awake, oriented x3, not in any acute distress. HEENT: anicteric sclera, pupils reactive to light and accommodation, EOMI CVS: S1-S2 clear, 2/6 SEM Chest: clear to auscultation bilaterally, no wheezing, rales or rhonchi Abdomen: soft nontender, nondistended, normal bowel sounds, no organomegaly Extremities: no cyanosis, clubbing or edema noted bilaterally Neuro: Cranial nerves II-XII intact, no focal neurological deficits  Lab Results: Basic Metabolic Panel:  Lab [id number] 0505 09/26/12 0404  NA 138 139  K 3.7 3.6  CL 106 107  CO2 22 22  GLUCOSE 115* 93  BUN 23 19  CREATININE 2.56* 2.09*  CALCIUM 9.0 8.8  MG -- --  PHOS -- --   CBC:  Lab 09/27/12 0505 09/25/12 1333  WBC 6.2 5.4  NEUTROABS -- 3.4  HGB 13.4 14.9  HCT 41.2 44.7  MCV 86.2 85.0  PLT 212 233    Micro Results: Recent Results (from the past 240 hour(s))  MRSA PCR SCREENING     Status: Normal   Collection Time   09/25/12  3:34 PM      Component Value Range Status Comment   MRSA by PCR NEGATIVE  NEGATIVE Final     Studies/Results: Ct Head Wo Contrast  09/25/2012  *RADIOLOGY REPORT*  Clinical Data: 51 year old female with headache.  History of cerebral aneurysm clipping.  CT HEAD WITHOUT CONTRAST  Technique:  Contiguous axial images were obtained from the base of the skull through the vertex  without contrast.  Comparison: 04/12/2012 CT  Findings: Postoperative changes in the right frontotemporal region with aneurysm clips in the right MCA and circle Willis regions again noted. The right frontal ventriculostomy catheter is again identified with the tip in the region of the posterior third ventricle. A remote infarct within the left cerebellum is again noted.  No acute intracranial abnormalities are identified, including mass lesion or mass effect, hydrocephalus, extra-axial fluid collection, midline shift, hemorrhage, or acute infarction.  The  visualized bony calvarium is unremarkable.  IMPRESSION: No evidence of acute intracranial abnormality.  Postoperative changes of cerebral aneurysm clipping, unchanged right frontal ventriculostomy catheter and remote left cerebellar infarct.   Original Report Authenticated By: Margarette Canada, M.D.     Medications: Scheduled Meds:    . hydrALAZINE  50 mg Oral TID  . isosorbide mononitrate  30 mg Oral Once  . isosorbide mononitrate  60 mg Oral Daily  . labetalol  200 mg Oral TID  . levETIRAcetam  500 mg Oral BID  . magnesium oxide  400 mg Oral Daily  . metoprolol tartrate  100 mg Oral BID  . nicotine  21 mg Transdermal Daily  . pantoprazole  40 mg Oral Daily  . sodium chloride  250 mL Intravenous Once  . sodium chloride  3 mL Intravenous Q12H  . [DISCONTINUED] cloNIDine  0.3 mg Oral TID  . [DISCONTINUED] isosorbide mononitrate  30 mg Oral Daily  . [DISCONTINUED] metoprolol tartrate  75 mg Oral BID      LOS: 2 days   Casper Pagliuca M.D. [facility] 09/27/2012, 3:57 PM Pager: [phone]  If 7PM-7AM, please contact night-coverage [email or web address] Password [email or web address]

## 2012-09-27 NOTE — Progress Notes (Signed)
Pt's BP elevated to 220/118 manual when giving pt her morning meds.  Rechecked pt's BP after a.m. BP meds and still elevated at 218/120 manual.  IV Hydralazine given. Pt originally set to d/c today, d/c orders in chart and prescriptions written.  Spoke with Dr. Tana Coast concerning BP and the fact that pt has yet to void since removal of foley catheter at 0800. Received new orders and to hold pt's d/c at this time.  Will bladder scan pt and collect UA. Will also continue to monitor pt's BP closely.

## 2012-09-27 NOTE — Progress Notes (Signed)
Pt c/o severe discomfort to bladder.  Attempted multiple times to void on bedside commode and bedpan.  Pt requesting bladder scan.  Bladder scan performed, 663ml measured.  In and out cath performed, 670 ml urine obtained.  Culture and sensitivity sent per orders.  Pt verbalized relief.  Will continue to monitor.  Coolidge Breeze, RN 09/27/2012

## 2012-09-27 NOTE — Progress Notes (Signed)
Talked to patient about follow up medical care; patient has a PCP but does not remember her name, patient stated "she is off of [place]'; Patient also stated that she goes to the [facility] for medical care; Patient lives in apt with her daughter and her 4 children and stated that since they moved in with her, that has put added stress on her with bills; patient has [employer] Medicare insurance and secondary Medicaid; pharmacy [facility] choice is [facility] on [address]- patient stated that she ran out of money trying to help her daughter and couldn't get her medication; CM encouraged patient to put some money aside for her medication; Offered patient [facility] services, patient refused.

## 2012-09-28 LAB — CBC
HCT: 38.3 % (ref 36.0–46.0)
MCH: 28.3 pg (ref 26.0–34.0)
MCHC: 32.9 g/dL (ref 30.0–36.0)
MCV: 86.1 fL (ref 78.0–100.0)
Platelets: 210 10*3/uL (ref 150–400)
RDW: 14.7 % (ref 11.5–15.5)
WBC: 6.4 10*3/uL (ref 4.0–10.5)

## 2012-09-28 LAB — BASIC METABOLIC PANEL
BUN: 24 mg/dL — ABNORMAL HIGH (ref 6–23)
BUN: 21 mg/dL (ref 6–23)
Calcium: 8.5 mg/dL (ref 8.4–10.5)
Calcium: 8.4 mg/dL (ref 8.4–10.5)
Chloride: 106 mEq/L (ref 96–112)
Creatinine, Ser: 2.33 mg/dL — ABNORMAL HIGH (ref 0.50–1.10)
Creatinine, Ser: 2.21 mg/dL — ABNORMAL HIGH (ref 0.50–1.10)
GFR calc Af Amer: 27 mL/min — ABNORMAL LOW (ref >90)
GFR calc Af Amer: 29 mL/min — ABNORMAL LOW (ref >90)
GFR calc non Af Amer: 25 mL/min — ABNORMAL LOW (ref >90)
Glucose, Bld: 92 mg/dL (ref 70–99)
Potassium: 3.8 mEq/L (ref 3.5–5.1)

## 2012-09-28 MED ORDER — ISOSORBIDE MONONITRATE ER 30 MG PO TB24: 60.0000 mg | ORAL_TABLET | Freq: Every day | ORAL | Status: DC

## 2012-09-28 MED ORDER — TAMSULOSIN HCL 0.4 MG PO CAPS
0.4000 mg | ORAL_CAPSULE | Freq: Every day | ORAL | Status: DC
  Administered 2012-09-28: 0.4 mg via ORAL
  Filled 2012-09-28: qty 1

## 2012-09-28 MED ORDER — OMEPRAZOLE 20 MG PO CPDR: 20.0000 mg | DELAYED_RELEASE_CAPSULE | Freq: Every day | ORAL | Status: DC

## 2012-09-28 MED ORDER — TAMSULOSIN HCL 0.4 MG PO CAPS: 0.4000 mg | ORAL_CAPSULE | Freq: Every day | ORAL | Status: DC

## 2012-09-28 MED ORDER — TAMSULOSIN HCL 0.4 MG PO CAPS
0.4000 mg | ORAL_CAPSULE | Freq: Every day | ORAL | Status: DC
  Filled 2012-09-28: qty 1

## 2012-09-28 MED ORDER — ATORVASTATIN CALCIUM 80 MG PO TABS: 80.0000 mg | ORAL_TABLET | Freq: Every day | ORAL | Status: DC

## 2012-09-28 MED ORDER — LABETALOL HCL 200 MG PO TABS: 200.0000 mg | ORAL_TABLET | Freq: Three times a day (TID) | ORAL | Status: DC

## 2012-09-28 MED ORDER — METOPROLOL TARTRATE 100 MG PO TABS: 100.0000 mg | ORAL_TABLET | Freq: Two times a day (BID) | ORAL | Status: DC

## 2012-09-28 MED ORDER — SODIUM CHLORIDE 0.9 % IV SOLN: INTRAVENOUS | Status: DC

## 2012-09-28 MED ORDER — HYDRALAZINE HCL 50 MG PO TABS: 50.0000 mg | ORAL_TABLET | Freq: Three times a day (TID) | ORAL | Status: DC

## 2012-09-28 MED ORDER — LEVETIRACETAM 500 MG PO TABS: 500.0000 mg | ORAL_TABLET | Freq: Two times a day (BID) | ORAL | Status: DC

## 2012-09-28 NOTE — Progress Notes (Addendum)
Pt still unable to void.  Bladder scan performed.  351ml visualized.  Paged Tylene Fantasia for orders at [phone], will await return call.  Coolidge Breeze, RN 09/28/2012 [phone]  [id number] - Electronic order received for I&O cath.  470ml urine obtained.  Pt verbalized relief. Will continue to monitor.  Coolidge Breeze, RN 09/28/2012

## 2012-09-28 NOTE — Discharge Summary (Signed)
Physician Discharge Summary  Patient ID: [name]: [id number] DOB/AGE: 50-Apr-1962 50 y.o.  Admit date: 09/25/2012 Discharge date: 09/28/2012  Primary Care Physician: Richardson Dopp, PAC-PCP  Discharge Diagnoses:    . CAD, NATIVE VESSEL . DIASTOLIC HEART FAILURE, CHRONIC . CHRONIC KIDNEY DISEASE UNSPECIFIED . Hypertension, accelerated . Tobacco abuse Acute renal failure on chronic kidney disease improving Transient urinary retention spontaneously resolved History of medical noncompliance History of seizure  Consults: None  Discharge Medications:   Medication List     As of 09/28/2012  2:47 PM    STOP taking these medications         amLODipine 10 MG tablet   Commonly known as: NORVASC      carvedilol 12.5 MG tablet   Commonly known as: COREG      losartan 50 MG tablet   Commonly known as: COZAAR      potassium chloride 10 MEQ tablet   Commonly known as: K-DUR      TAKE these medications         atorvastatin 80 MG tablet   Commonly known as: LIPITOR   Take 1 tablet (80 mg total) by mouth daily.      hydrALAZINE 50 MG tablet   Commonly known as: APRESOLINE   Take 1 tablet (50 mg total) by mouth 3 (three) times daily.      isosorbide mononitrate 30 MG 24 hr tablet   Commonly known as: IMDUR   Take 2 tablets (60 mg total) by mouth daily.      labetalol 200 MG tablet   Commonly known as: NORMODYNE   Take 1 tablet (200 mg total) by mouth 3 (three) times daily.      levETIRAcetam 500 MG tablet   Commonly known as: KEPPRA   Take 1 tablet (500 mg total) by mouth 2 (two) times daily.      magnesium oxide 400 MG tablet   Commonly known as: MAG-OX   Take 400 mg by mouth daily.      metoCLOPramide 10 MG tablet   Commonly known as: REGLAN   Take 1 tablet (10 mg total) by mouth 3 (three) times daily as needed (nausea).      metoprolol 100 MG tablet   Commonly known as: LOPRESSOR   Take 1 tablet (100 mg total) by mouth 2 (two) times daily.     nitroGLYCERIN 0.4 MG SL tablet   Commonly known as: NITROSTAT   Place 0.4 mg under the tongue every 5 (five) minutes as needed. For chest pain      omeprazole 20 MG capsule   Commonly known as: PRILOSEC   Take 1 capsule (20 mg total) by mouth daily.      Tamsulosin HCl 0.4 MG Caps   Commonly known as: FLOMAX   Take 1 capsule (0.4 mg total) by mouth at bedtime.          Brief H and P: For complete details please refer to admission H and P, but in brief With past medical history of malignant hypertension, CAD, subarachnoid hemorrhage with multiple intracranial aneurysms as well as noncompliance had stopped taking her blood pressure medication several days ago. She has been quite noncompliant with medications and followups. She came to emergency room complaining of headache. She was noted to have a blood pressure systolic around 123456. CT scan of the head was unremarkable for any acute findings including bleed or infarct. Patient was given Ativan, Dilaudid and started on a Cardene drip in the emergency  room. Her pressures came down into the 190s. Hospitalists were called for further evaluation, treatment and admission.   Hospital Course:   Hypertension, accelerated: likely sec to non-compliance, per patient ran out of her meds a week ago and doesnot remember all their names. She was admitted to the step down unit in brief he started on Cardene drip. Cardene drip was discontinued once she was transitioned to oral medications. Upon verifying with her pharmacy, and was found that she had not filled her medications since July 2013. Patient was placed on oral Lopressor, hydralazine, Imdur, added labetalol. Cozaar and Lasix was not started due to AKI on CKD.   CAD, NATIVE VESSEL: Currently no chest pain, she also had a history of A. fib during the previous admission but not a Coumadin candidate sec to history of recent [place], CHRONIC: Likely secondary to hypertensive  cardiomyopathy due to noncompliance. Patient's lung exam was clear but BNP was 4012 which is not specific due to CKD/AKI.  CHRONIC KIDNEY DISEASE UNSPECIFIED: Likely secondary to noncompliance, hypertensive nephropathy, hold off on Lasix and Cozaar sec to worsening Cr. Patient was given gentle hydration. Her creatinine level peaked at 2.56, currently trending down to 2.2. She will have repeat BMET checked on her followup appointment.  Noncompliance; see #1, patient was strongly counseled to be compliant with her medications, she states that now her daughter is staying with her which will help.   Day of Discharge BP 168/89  Pulse 84  Temp 97.8 F (36.6 C) (Oral)  Resp 18  Ht 5\' 1"  (1.549 m)  Wt 76.658 kg (169 lb)  BMI 31.93 kg/m2  SpO2 99%  Physical Exam: General: Alert and awake oriented x3 not in any acute distress. HEENT: anicteric sclera, pupils reactive to light and accommodation CVS: S1-S2 clear, 2/6 SEM Chest: clear to auscultation bilaterally, no wheezing rales or rhonchi Abdomen: soft nontender, nondistended, normal bowel sounds, no organomegaly Extremities: no cyanosis, clubbing or edema noted bilaterally Neuro: Cranial nerves II-XII intact, no focal neurological deficits     The results of significant diagnostics from this hospitalization (including imaging, microbiology, ancillary and laboratory) are listed below for reference.    LAB RESULTS: Basic Metabolic Panel:  Lab [id number] 09/28/12 0523  NA 135 137  K 3.8 3.6  CL 106 106  CO2 21 21  GLUCOSE 92 91  BUN 21 24*  CREATININE 2.21* 2.33*  CALCIUM 8.4 8.5  MG -- --  PHOS -- --   CBC:  Lab 09/28/12 0523 09/27/12 0505 09/25/12 1333  WBC 6.4 6.2 --  NEUTROABS -- -- 3.4  HGB 12.6 13.4 --  HCT 38.3 41.2 --  MCV 86.1 -- --  PLT 210 212 --    Significant Diagnostic Studies:  Ct Head Wo Contrast  09/25/2012  *RADIOLOGY REPORT*  Clinical Data: 51 year old female with headache.  History of cerebral  aneurysm clipping.  CT HEAD WITHOUT CONTRAST  Technique:  Contiguous axial images were obtained from the base of the skull through the vertex without contrast.  Comparison: 04/12/2012 CT  Findings: Postoperative changes in the right frontotemporal region with aneurysm clips in the right MCA and circle Willis regions again noted. The right frontal ventriculostomy catheter is again identified with the tip in the region of the posterior third ventricle. A remote infarct within the left cerebellum is again noted.  No acute intracranial abnormalities are identified, including mass lesion or mass effect, hydrocephalus, extra-axial fluid collection, midline shift, hemorrhage, or acute infarction.  The visualized bony calvarium is unremarkable.  IMPRESSION: No evidence of acute intracranial abnormality.  Postoperative changes of cerebral aneurysm clipping, unchanged right frontal ventriculostomy catheter and remote left cerebellar infarct.   Original Report Authenticated By: Margarette Canada, M.D.      Disposition and Follow-up: Discharge Orders    Future Appointments: Provider: Department: Dept Phone: Center:   10/04/2012 11:10 AM Liliane Shi, [facility]) [phone] [facility]     Future Orders Please Complete By Expires   Diet - low sodium heart healthy      Increase activity slowly      (HEART FAILURE PATIENTS) Call MD:  Anytime you have any of the following symptoms: 1) 3 pound weight gain in 24 hours or 5 pounds in 1 week 2) shortness of breath, with or without a dry hacking cough 3) swelling in the hands, feet or stomach 4) if you have to sleep on extra pillows at night in order to breathe.      Discharge instructions      Comments:   Please take all your medications as directed.       DISPOSITION: Home DIET: Heart healthy diet ACTIVITY: As tolerated   TESTS THAT NEED FOLLOW-UP BMET  DISCHARGE FOLLOW-UP Follow-up Information    Follow up with Richardson Dopp,  PA. On 10/04/2012. (at 11:10 AM. you need labs BMET to check renal function )    Contact information:   [address] [place] [address] [phone]          Time spent on Discharge: 38 minutes  Signed:   RAI,RIPUDEEP M.D. [facility] 09/28/2012, 2:47 PM Pager: [phone]

## 2012-09-29 ENCOUNTER — Telehealth: Payer: Self-pay | Admitting: *Deleted

## 2012-09-29 LAB — URINE CULTURE: Colony Count: NO GROWTH

## 2012-09-29 NOTE — Telephone Encounter (Signed)
Message copied by Michae Kava on Wed Sep 29, 2012 11:59 AM ------      Message from: [place], [place] T      Created: Wed Sep 29, 2012  8:28 AM       Negative urine culture.      Hospital patient.      Patient was admitted to [facility] service recently.      Not sure why this came to me.      Send copy of lab to her PCP.      Richardson Dopp, PA-C  8:27 AM 09/29/2012

## 2012-09-29 NOTE — Telephone Encounter (Signed)
s/w pt's daughter due to pt's phone not working at this time per daughter. I asked who the pt's PCP was and daughter says has not PCP. Pt saw Brynda Rim. [facility] 06/2011. Pt has appt 12/16 w/PA

## 2012-10-04 ENCOUNTER — Encounter: Payer: No Typology Code available for payment source | Admitting: Physician Assistant

## 2012-10-04 MED ORDER — OMEPRAZOLE 40 MG PO CPDR: 40.0000 mg | DELAYED_RELEASE_CAPSULE | Freq: Every day | ORAL | Status: DC

## 2012-10-04 NOTE — Progress Notes (Deleted)
[address] [place], [place]  [address] Phone: [phone], Fax:  [phone]  Date:  10/04/2012   Name:  [name]   DOB:  [birth year]   MRN:  [id number]  PCP:  No primary provider on file.  Primary Cardiologist:  Dr. Loralie Champagne  Primary Electrophysiologist:  None    History of Present Illness: [name] is a 51 y.o. female ***  She has a hx of resistant HTN, diastolic CHF, and CAD.  Myoview 9/12: no ischemia, EF 58%.  She has had difficulty with compliance with her medications.  Last seen in this office 06/2011.  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 She was found to have 4 aneurysms. She did undergo endovascular coiling. VP shunt was also placed at that time. She was placed on antiepileptics for seizure prophylaxis. She was admitted in 7/13 with accelerated hypertension. She was readmitted in 7/13 with new onset atrial fibrillation. She converted back to sinus rhythm on her own. She was not felt to be anticoagulation candidate due to recent subarachnoid hemorrhage. She was most recently admitted 12/7-12/10. She presented with complaints of headaches and a blood pressure of 123456 systolic. She had been off of her medications. She was placed on Cardene drip. She was placed on Lopressor, hydralazine, isosorbide and labetalol. Creatinine was somewhat worsened during her hospitalization. Head CT was negative for intracranial abnormality.  Labs (3/10): LDL 99, HDL 29, creatinine 1.4  Labs (9/11): K 3.6, creatinine 1.75  Labs (05/12/11): K 3.0, creat 2.3  Labs (05/20/11): K 3.7, creat 2.0  Labs (05/27/11): K 3.9, creat 2.1  Labs (06/13/11): K 4, creat 1.8   Labs (12/13):   K 3.8, creatinine 2.21  Wt Readings from Last 3 Encounters:  09/28/12 169 lb (76.658 kg)  05/03/12 141 lb 1.5 oz (64 kg)  04/27/12 147 lb 11.3 oz (67 kg)    Past Medical History:  1. Hypertension (resistant).  2.  Hypercholesterolemia.  3. Tobacco abuse.  4. Coronary artery disease. The patient had NSTEMI in February 2009. Left heart catheterization at that time showed a 70% distal LAD lesion that was hazy consistent with a plaque rupture, mCFX 30%, dCFX 50%, dRCA 95%, PLB 40%. She did have a Promus drug-eluting stent placed in her distal RCA. Lex. Myoview 9/12: no ischemia, EF 58%.  5. Chronic kidney disease (creat 2-2.3)  6. Remote intracranial hemorrhage. H/o cerebral aneurysm clipping  7. Diastolic congestive heart failure. Last echo (2/09) with EF 60%, severe LV hypertrophy, normal RV.  8. Low back pain/sciatica  9. GERD   Past Medical History  Diagnosis Date  . HTN (hypertension)     resistant  . Hypercholesterolemia   . Tobacco abuse     resumed smoking half a pack a day. She was a smoker in the past and states she was abl eto stop in the past using nicotine patches  . CAD (coronary artery disease)     Pt has St elevation MI in Feb 2009. Left hear tcath at tha ttime showed a 70% distal LAD lesion that was hazy consistent with a plaque rupture. She had a 50% distal circumflex stenosis an a 95% distal RCA stenosis. She did havve drug eluting stent placed in her distal RCA  . Chronic kidney disease     most recent creatinine 1.4 in 3/10  . Intracranial hemorrhage, spontaneous intraparenchymal, idiopathic, remote, resolved   . Diastolic heart failure  pt most recent echo was in Feb 2009 w an EF of 60% and severe left ventricular hypertrophy. The pt did have evidence of diastolic dysfunction. The RV appeared normal  . Subarachnoid hemorrhage     03/2012    Current Outpatient Prescriptions  Medication Sig Dispense Refill  . atorvastatin (LIPITOR) 80 MG tablet Take 1 tablet (80 mg total) by mouth daily.  30 tablet  3  . hydrALAZINE (APRESOLINE) 50 MG tablet Take 1 tablet (50 mg total) by mouth 3 (three) times daily.  90 tablet  3  . isosorbide mononitrate (IMDUR) 30 MG 24 hr tablet Take 2  tablets (60 mg total) by mouth daily.  60 tablet  3  . labetalol (NORMODYNE) 200 MG tablet Take 1 tablet (200 mg total) by mouth 3 (three) times daily.  90 tablet  3  . levETIRAcetam (KEPPRA) 500 MG tablet Take 1 tablet (500 mg total) by mouth 2 (two) times daily.  60 tablet  3  . magnesium oxide (MAG-OX) 400 MG tablet Take 400 mg by mouth daily.      . metoCLOPramide (REGLAN) 10 MG tablet Take 1 tablet (10 mg total) by mouth 3 (three) times daily as needed (nausea).  30 tablet  0  . metoprolol (LOPRESSOR) 100 MG tablet Take 1 tablet (100 mg total) by mouth 2 (two) times daily.  60 tablet  3  . nitroGLYCERIN (NITROSTAT) 0.4 MG SL tablet Place 0.4 mg under the tongue every 5 (five) minutes as needed. For chest pain      . omeprazole (PRILOSEC) 40 MG capsule Take 1 capsule (40 mg total) by mouth daily. 30 MINUTES BEFORE BREAKFAST  30 capsule  3  . Tamsulosin HCl (FLOMAX) 0.4 MG CAPS Take 1 capsule (0.4 mg total) by mouth at bedtime.  30 capsule  0    Allergies: Allergies  Allergen Reactions  . Ampicillin Other (See Comments)    unknown  . Penicillins Other (See Comments)    unknown    Social History:  The patient  reports that she has been smoking.  She has never used smokeless tobacco. She reports that she does not drink alcohol or use illicit drugs.   ROS:  Please see the history of present illness.   ***   All other systems reviewed and negative.   PHYSICAL EXAM: VS:  There were no vitals taken for this visit. Well nourished, well developed, in no acute distress HEENT: normal Neck: no JVD Cardiac:  normal S1, S2; RRR; no murmur Lungs:  clear to auscultation bilaterally, no wheezing, rhonchi or rales Abd: soft, nontender, no hepatomegaly Ext: no edema Skin: warm and dry Neuro:  CNs 2-12 intact, no focal abnormalities noted  EKG:  ***      ASSESSMENT AND PLAN:  1. ***  Signed, Richardson Dopp, PA-C  11:38 AM 10/04/2012    This encounter was created in error - please  disregard.

## 2012-12-07 IMAGING — CT CT HEAD W/O CM
1 series · 16 of 30 positions shown, 20 images · non-contrast
Comparison: 03/20/2012.

CLINICAL DATA: Follow-up intracranial hemorrhage.

CT HEAD WITHOUT CONTRAST
TECHNIQUE: Contiguous axial images were obtained from the base of
the skull through the vertex without contrast.

[Series 2: head routine 4.8 h37s · axial · 0.44mm/px · z∈[-120,+35]mm · 16 of 36 slices shown, 20 images]
[im 2/36  brain]
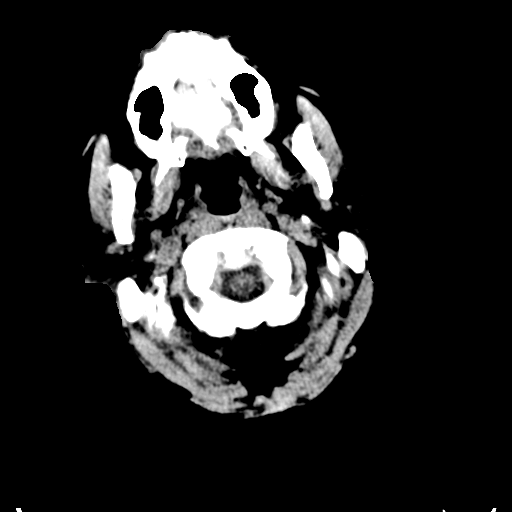
[im 2/36  bone]
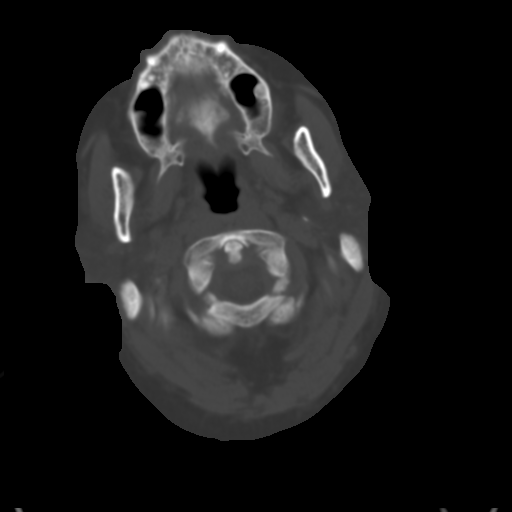
[im 4/36  brain]
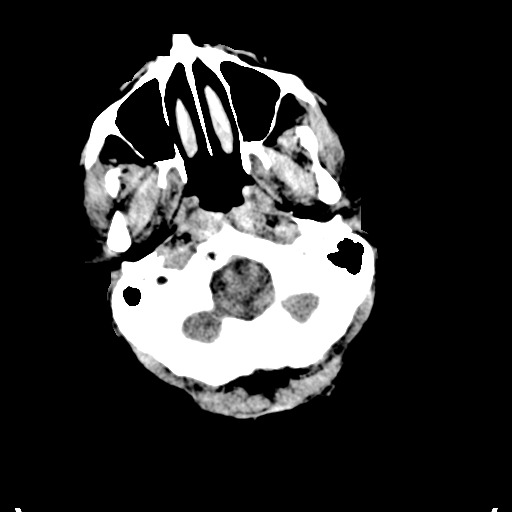
[im 7/36  brain]
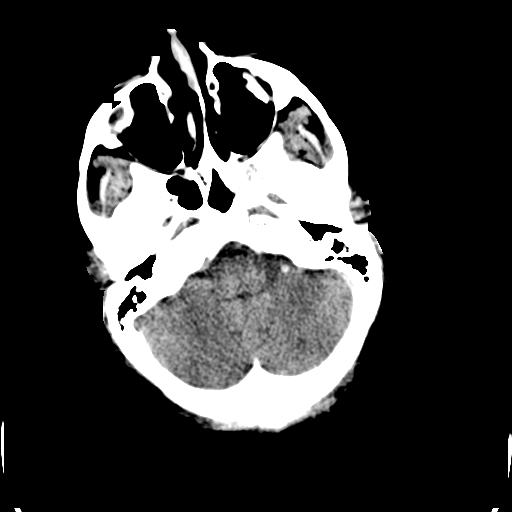
[im 9/36  brain]
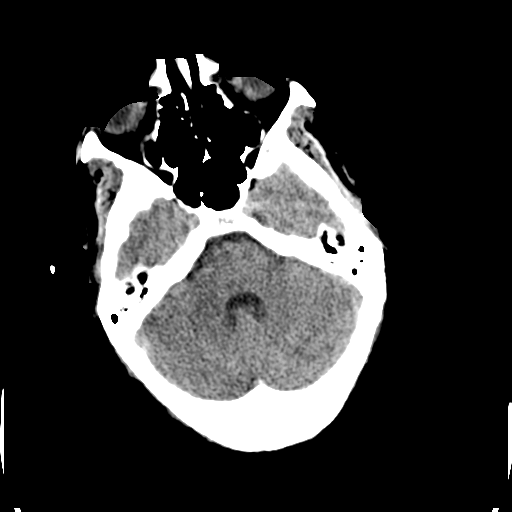
[im 10/36  brain]
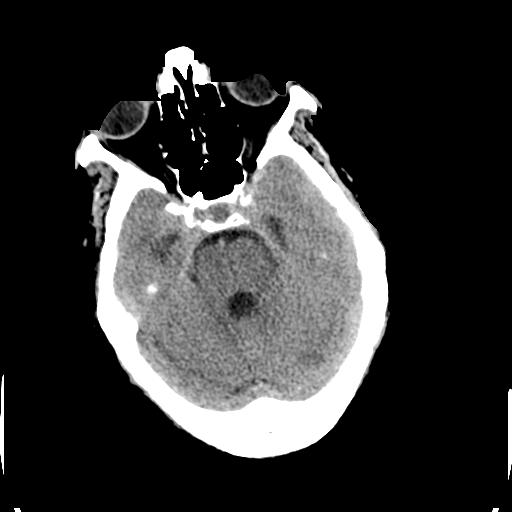
[im 10/36  bone]
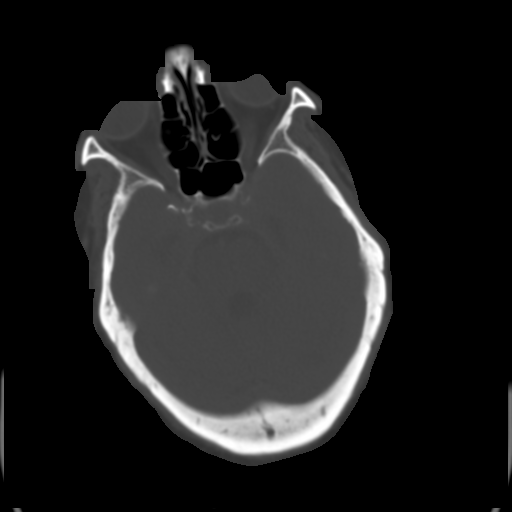
[im 13/36  brain]
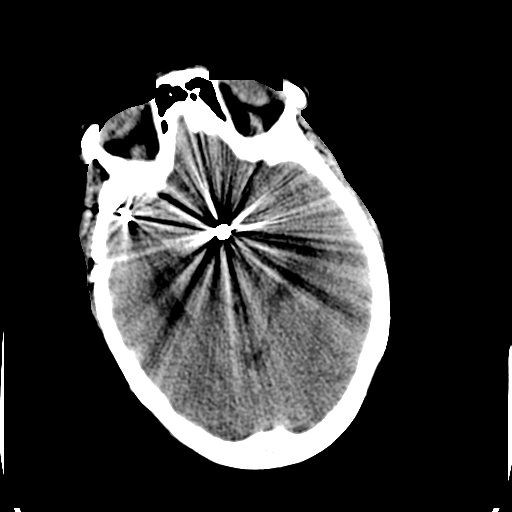
[im 15/36  brain]
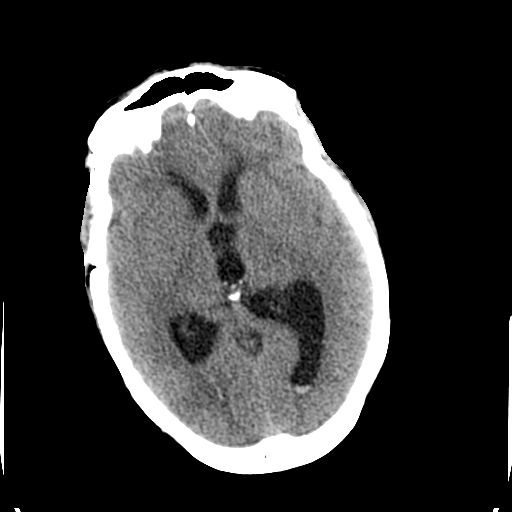
[im 17/36  brain]
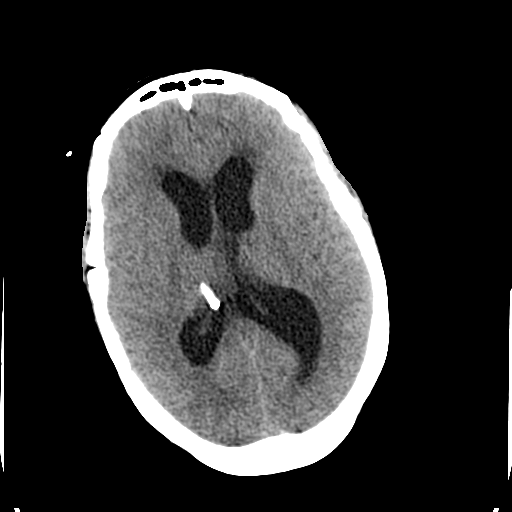
[im 19/36  brain]
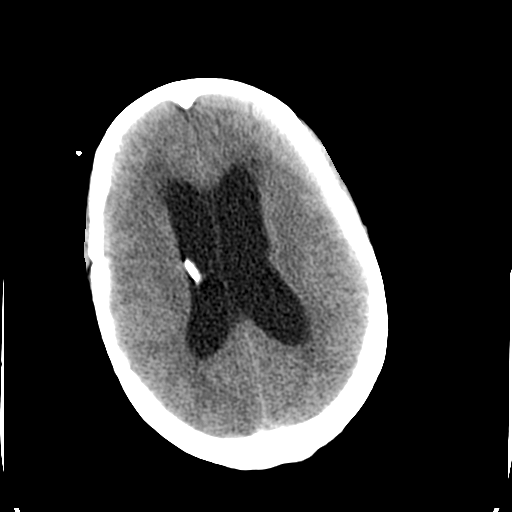
[im 19/36  bone]
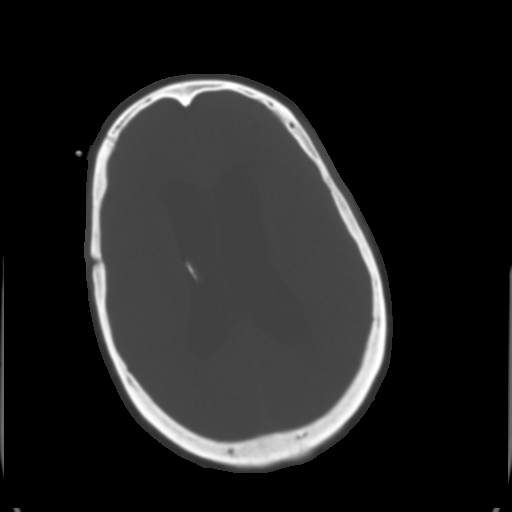
[im 21/36  brain]
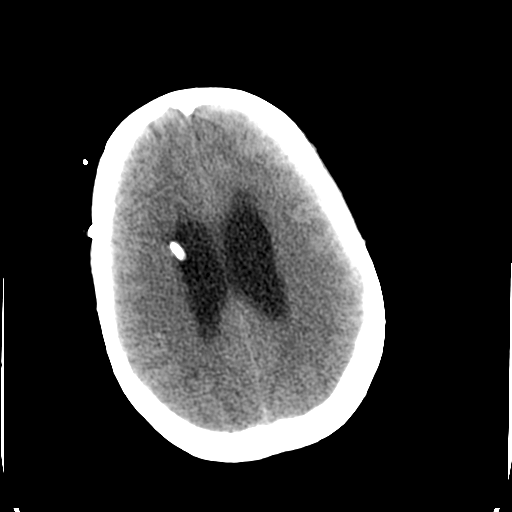
[im 23/36  brain]
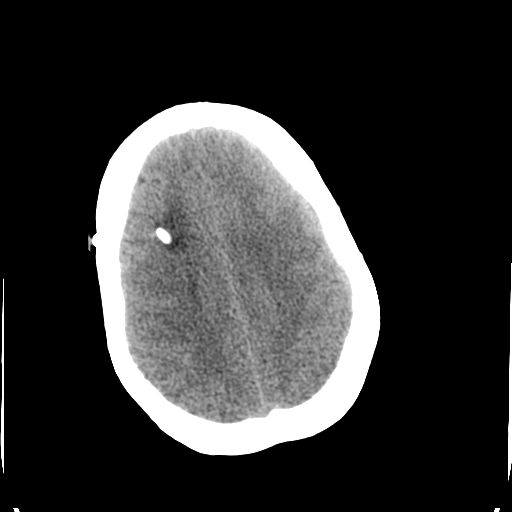
[im 26/36  brain]
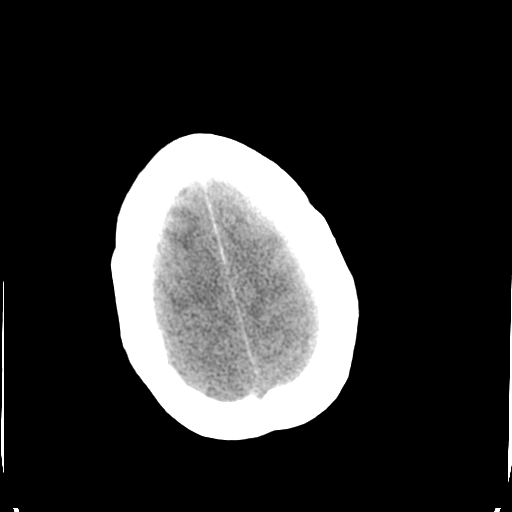
[im 27/36  brain]
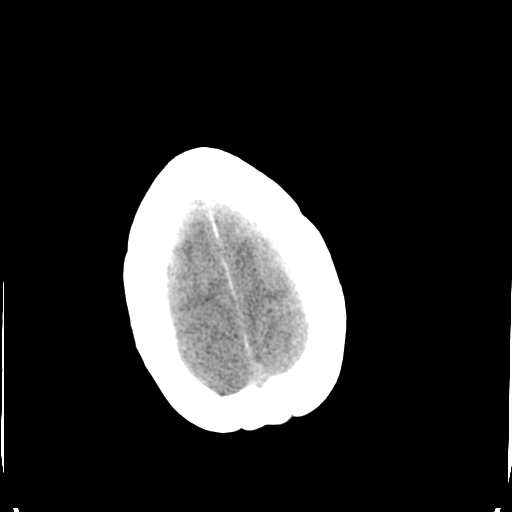
[im 27/36  bone]
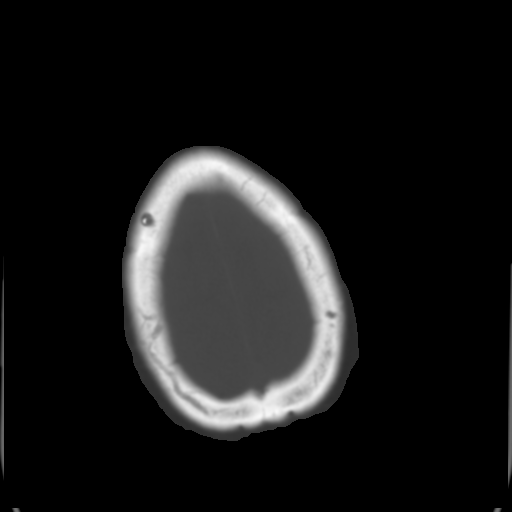
[im 29/36  brain]
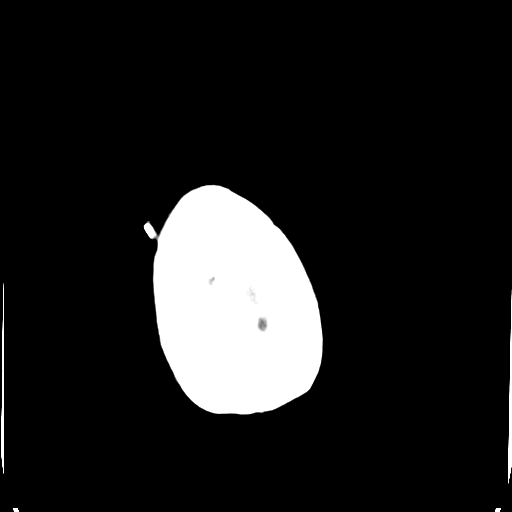
[im 32/36  brain]
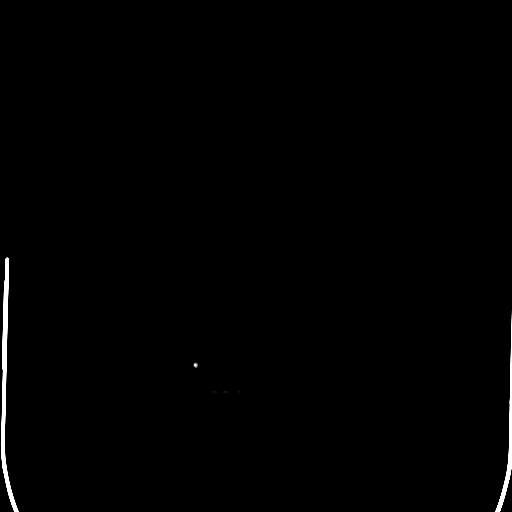
[im 34/36  brain]
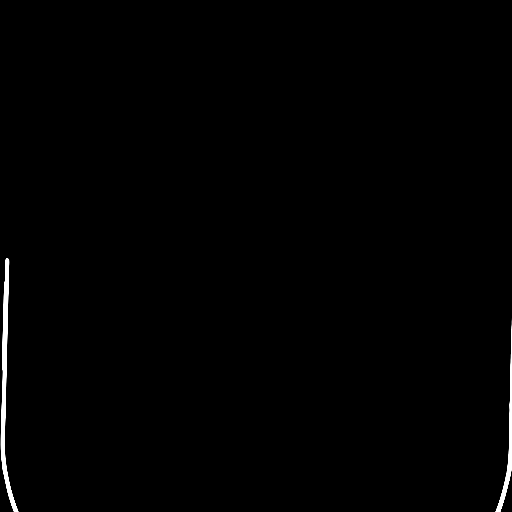

[16 of 30 positions shown; findings below may reference images not displayed]

FINDINGS: Stable ventricular catheter.  The ventricles are mildly
enlarged but stable in size.  A small amount of interventricular
hemorrhage is again demonstrated.  Persistent diffuse edema with
compressed sulci. No subdural hematoma.  Compressed but stable CSF
spaces around the brainstem.
IMPRESSION: 1.  Stable ventriculomegaly and diffuse cerebral edema.
2.  Stable ventricular catheter.
3.  Stable intraventricular hemorrhage.

## 2012-12-10 IMAGING — CR DG CHEST 1V PORT
1 series · 1 of 1 positions shown · non-contrast
Comparison: 03/15/2012

CLINICAL DATA: Endotracheal tube position

PORTABLE CHEST - 1 VIEW

[AP]
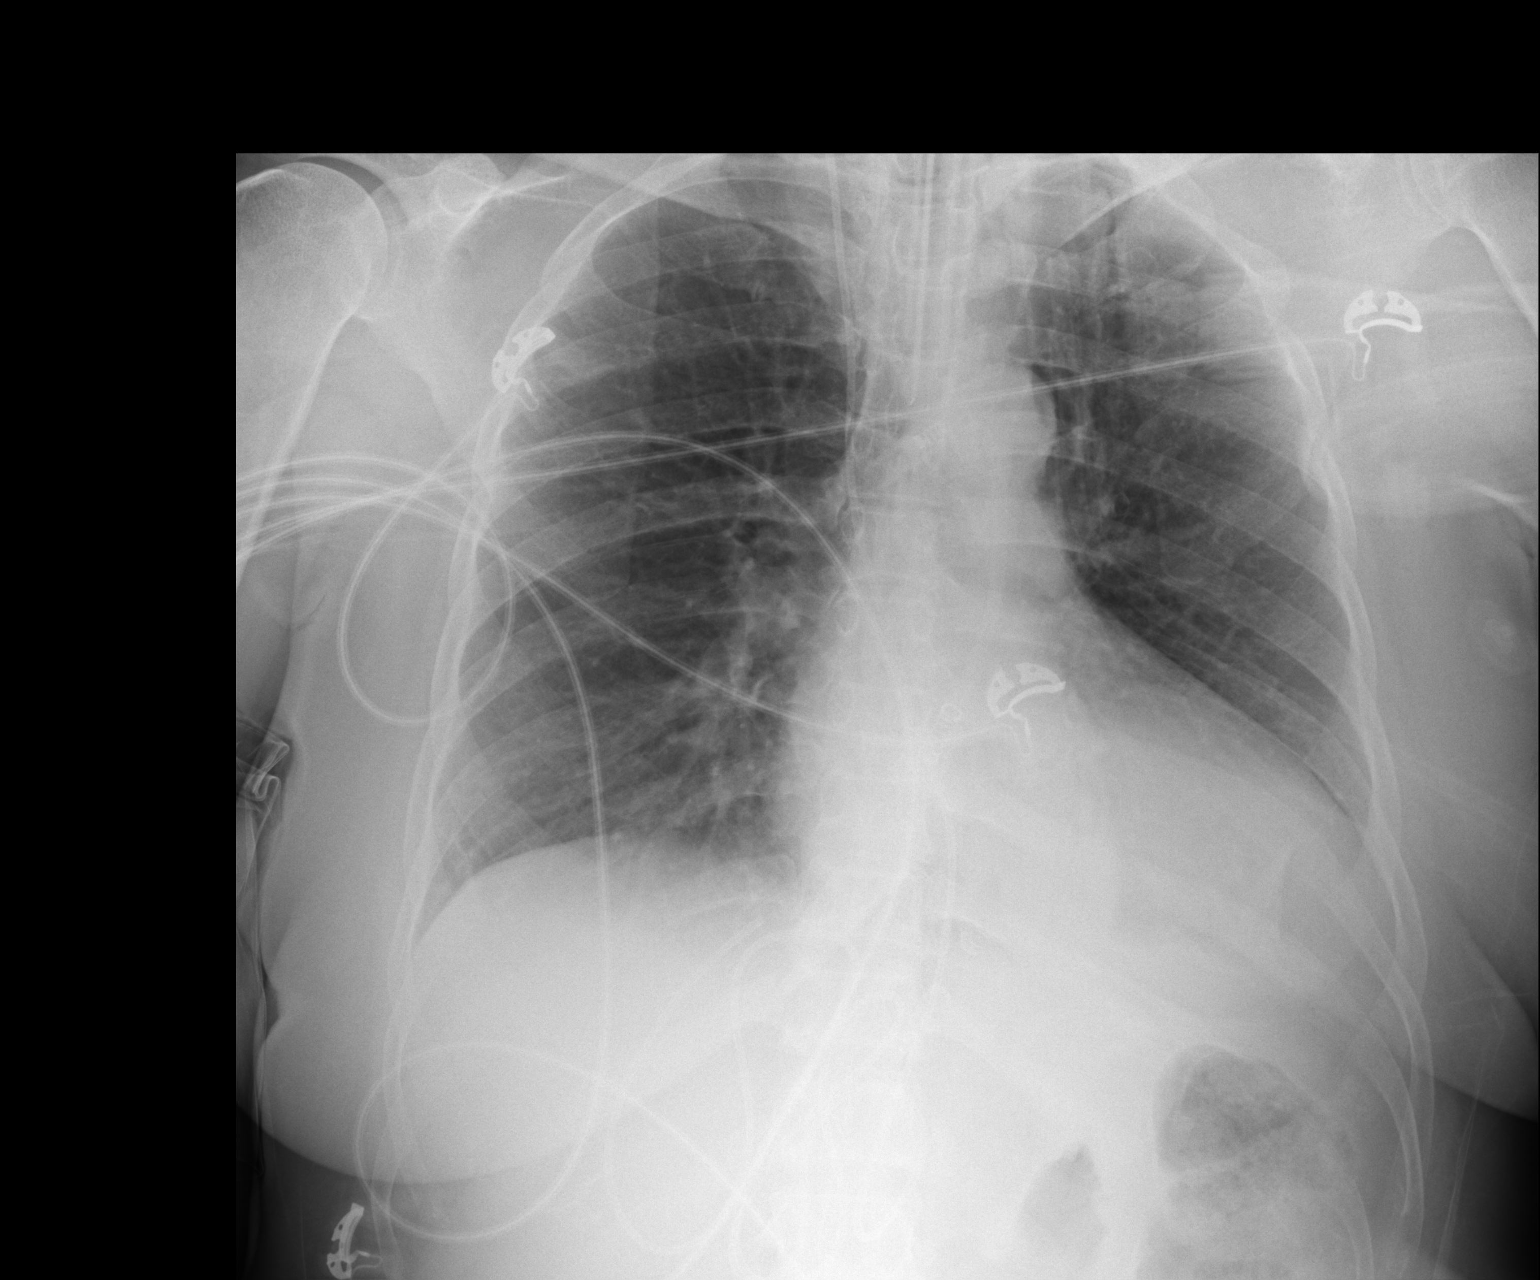

[1 of 1 positions shown; findings below may reference images not displayed]

FINDINGS: Endotracheal tube terminates 3 cm above the carina.

Mild left basilar opacity, possibly atelectasis.  Lungs are
otherwise clear.  No pleural effusion or pneumothorax.

Cardiomediastinal silhouette is within normal limits.

Suspected VP shunt catheter coursing along the right hemithorax and
terminating in the right upper abdomen.
IMPRESSION: Endotracheal tube terminates 3 cm above the carina.

## 2012-12-16 IMAGING — CR DG ABD PORTABLE 1V
2 series · 2 of 2 positions shown · non-contrast
Comparison: 03/29/2012

CLINICAL DATA: Feeding tube placement

PORTABLE ABDOMEN - 1 VIEW

[AP (1 of 2)]
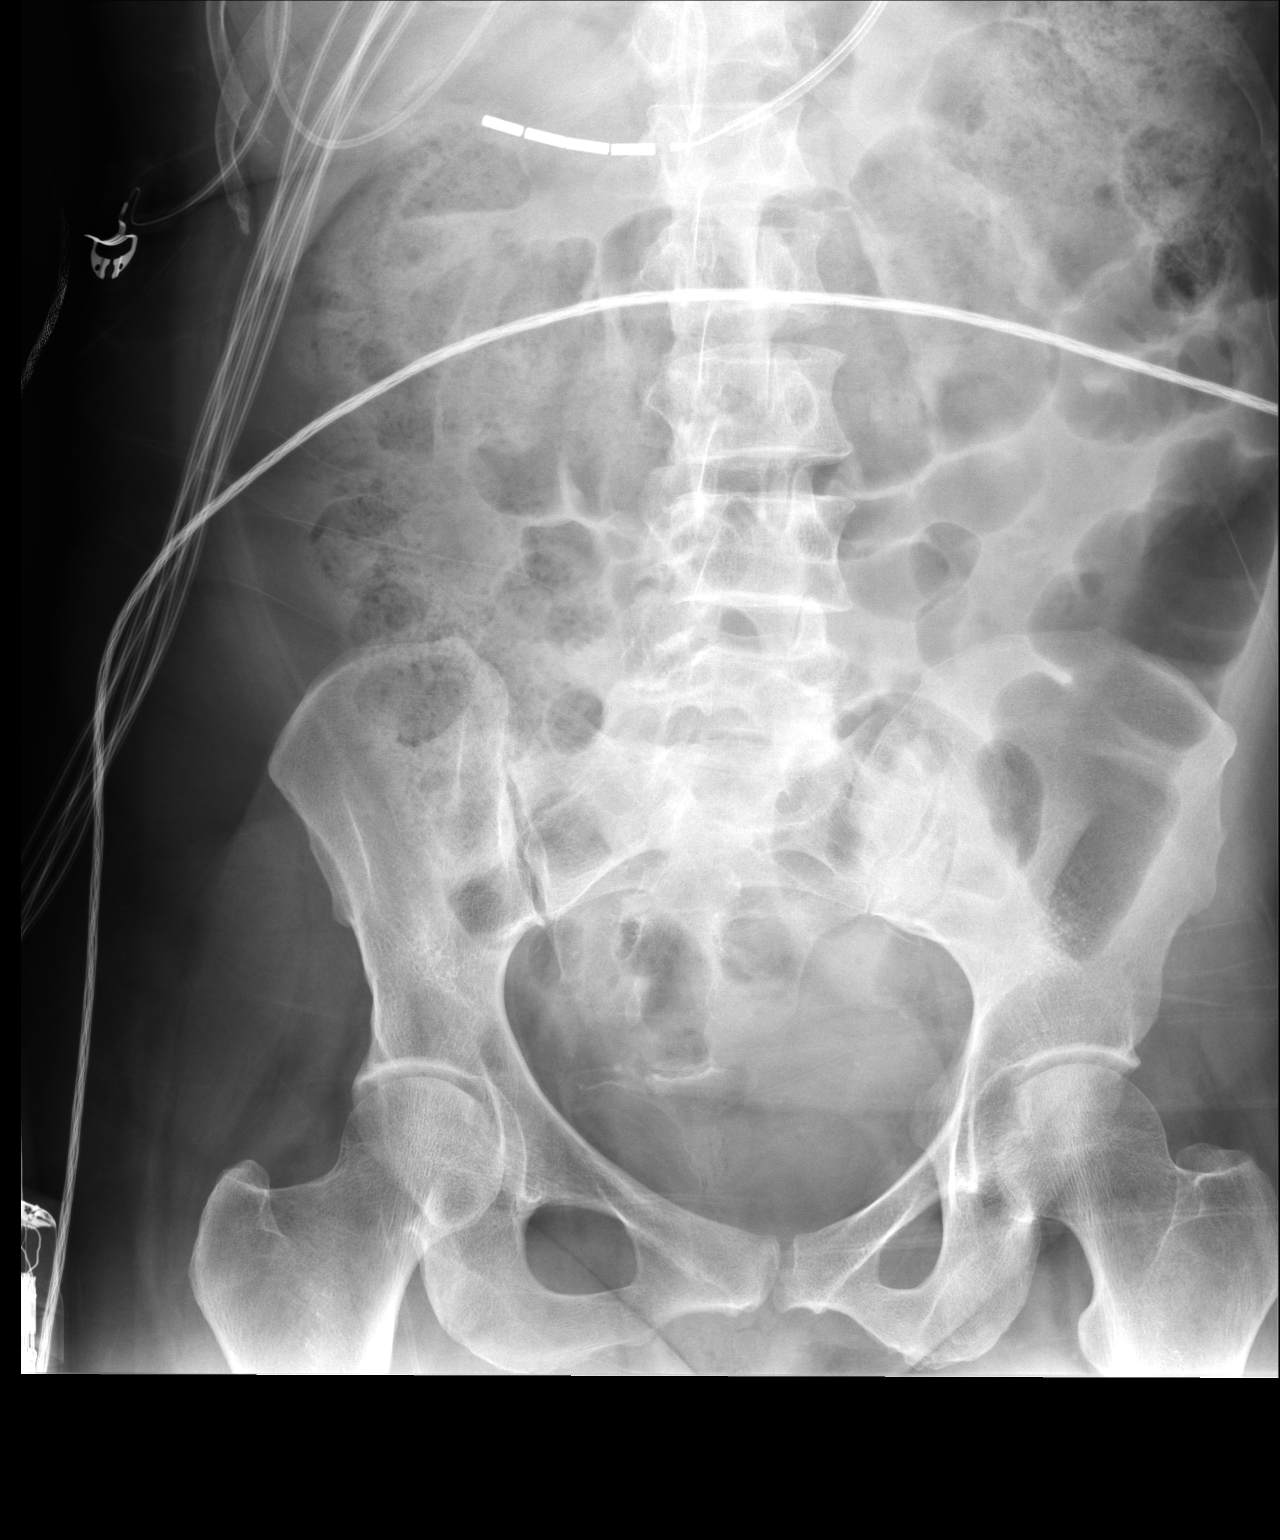

[AP (2 of 2)]
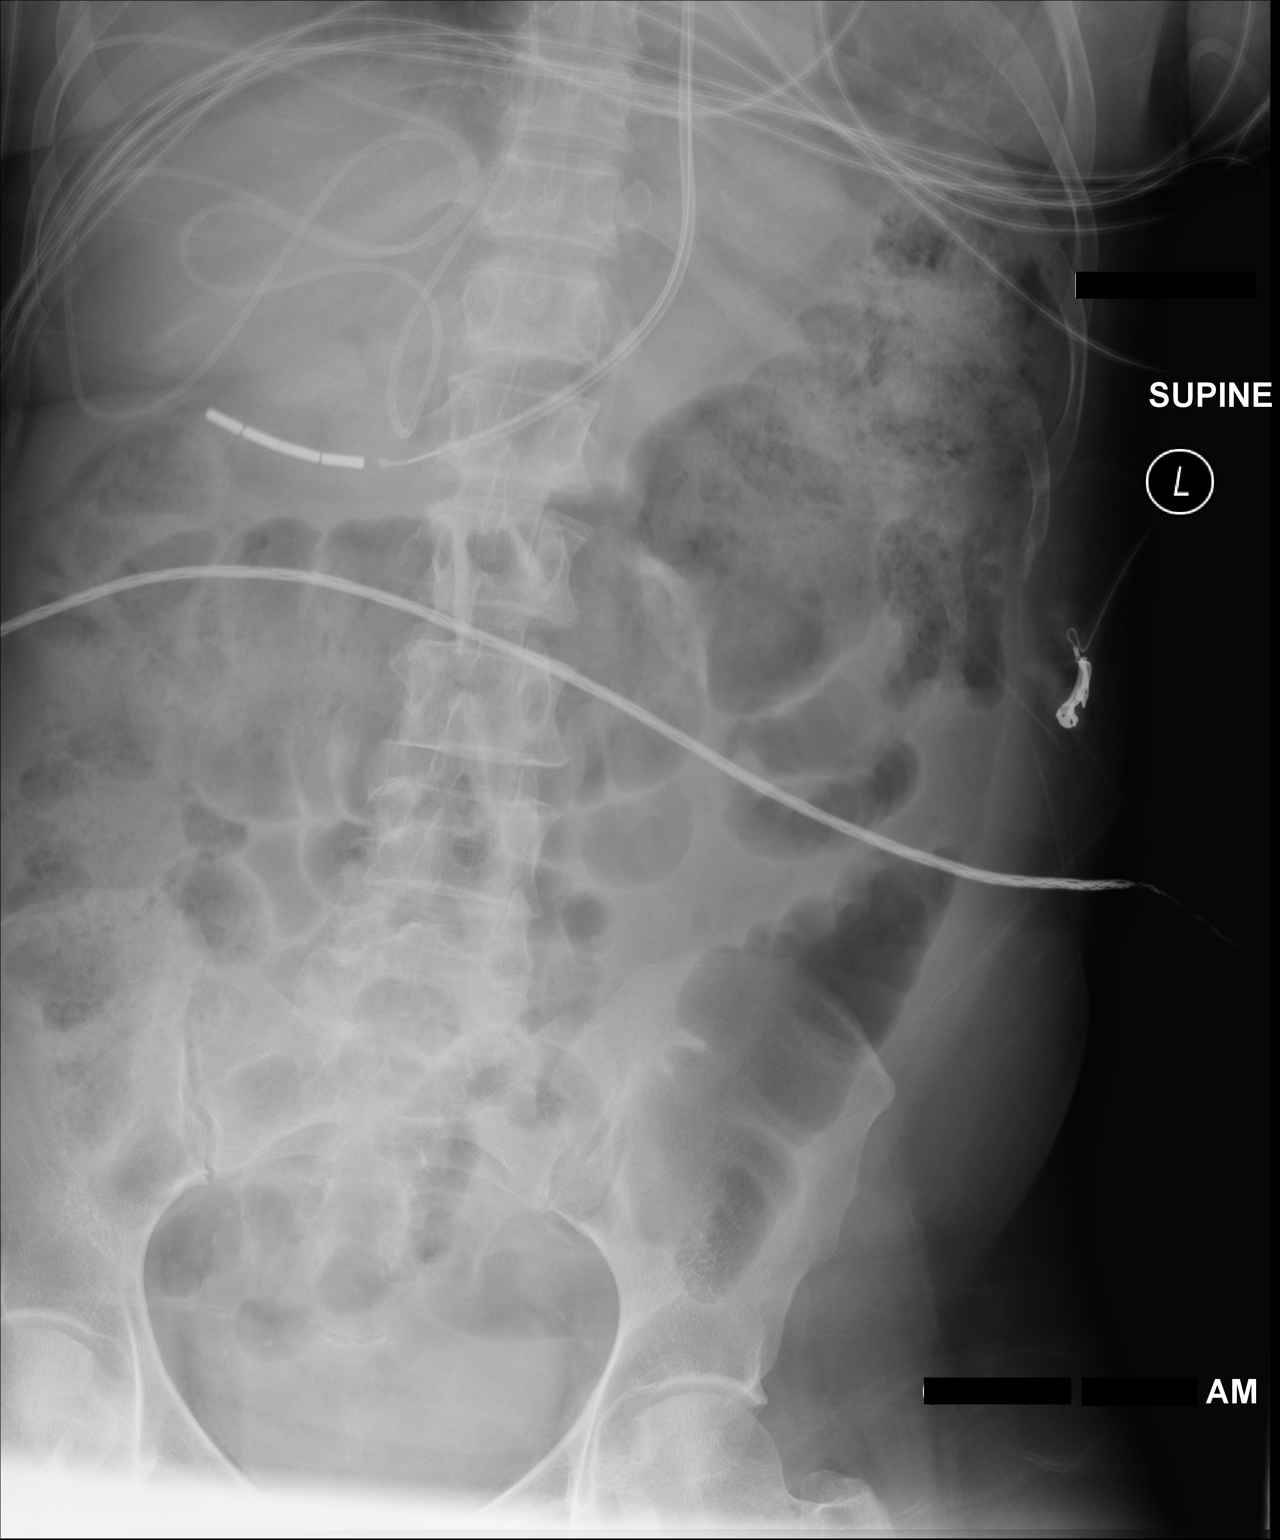

[2 of 2 positions shown; findings below may reference images not displayed]

FINDINGS: Feeding tube tip projects over the distal stomach.
Nonobstructive bowel gas pattern.  Hemidiaphragms are excluded from
the image.  No acute osseous finding.
IMPRESSION: Feeding tube tip projects over the distal stomach.

## 2013-05-06 ENCOUNTER — Inpatient Hospital Stay (HOSPITAL_COMMUNITY)
Admission: EM | Admit: 2013-05-06 | Discharge: 2013-05-14 | DRG: 264 | Disposition: A | Discharge status: Home / Self Care | Payer: No Typology Code available for payment source | Attending: Internal Medicine | Admitting: Internal Medicine

## 2013-05-06 ENCOUNTER — Emergency Department (HOSPITAL_COMMUNITY): Payer: No Typology Code available for payment source

## 2013-05-06 ENCOUNTER — Inpatient Hospital Stay (HOSPITAL_COMMUNITY): Payer: No Typology Code available for payment source

## 2013-05-06 ENCOUNTER — Encounter (HOSPITAL_COMMUNITY): Payer: Self-pay | Admitting: Emergency Medicine

## 2013-05-06 DIAGNOSIS — I5032 Chronic diastolic (congestive) heart failure: Secondary | ICD-10-CM | POA: Diagnosis present

## 2013-05-06 DIAGNOSIS — N185 Chronic kidney disease, stage 5: Secondary | ICD-10-CM | POA: Diagnosis present

## 2013-05-06 DIAGNOSIS — D631 Anemia in chronic kidney disease: Secondary | ICD-10-CM | POA: Diagnosis present

## 2013-05-06 DIAGNOSIS — I498 Other specified cardiac arrhythmias: Secondary | ICD-10-CM | POA: Diagnosis present

## 2013-05-06 DIAGNOSIS — Z91199 Patient's noncompliance with other medical treatment and regimen due to unspecified reason: Secondary | ICD-10-CM

## 2013-05-06 DIAGNOSIS — N179 Acute kidney failure, unspecified: Secondary | ICD-10-CM | POA: Diagnosis present

## 2013-05-06 DIAGNOSIS — I5033 Acute on chronic diastolic (congestive) heart failure: Secondary | ICD-10-CM | POA: Diagnosis present

## 2013-05-06 DIAGNOSIS — I2510 Atherosclerotic heart disease of native coronary artery without angina pectoris: Secondary | ICD-10-CM | POA: Diagnosis present

## 2013-05-06 DIAGNOSIS — Z9861 Coronary angioplasty status: Secondary | ICD-10-CM

## 2013-05-06 DIAGNOSIS — IMO0001 Reserved for inherently not codable concepts without codable children: Principal | ICD-10-CM | POA: Diagnosis present

## 2013-05-06 DIAGNOSIS — I5030 Unspecified diastolic (congestive) heart failure: Secondary | ICD-10-CM | POA: Diagnosis present

## 2013-05-06 DIAGNOSIS — I119 Hypertensive heart disease without heart failure: Secondary | ICD-10-CM | POA: Diagnosis present

## 2013-05-06 DIAGNOSIS — Z8673 Personal history of transient ischemic attack (TIA), and cerebral infarction without residual deficits: Secondary | ICD-10-CM

## 2013-05-06 DIAGNOSIS — N2581 Secondary hyperparathyroidism of renal origin: Secondary | ICD-10-CM | POA: Diagnosis present

## 2013-05-06 DIAGNOSIS — N184 Chronic kidney disease, stage 4 (severe): Secondary | ICD-10-CM

## 2013-05-06 DIAGNOSIS — N189 Chronic kidney disease, unspecified: Secondary | ICD-10-CM

## 2013-05-06 DIAGNOSIS — I161 Hypertensive emergency: Secondary | ICD-10-CM

## 2013-05-06 DIAGNOSIS — I509 Heart failure, unspecified: Secondary | ICD-10-CM | POA: Diagnosis present

## 2013-05-06 DIAGNOSIS — E876 Hypokalemia: Secondary | ICD-10-CM | POA: Diagnosis present

## 2013-05-06 DIAGNOSIS — Z9119 Patient's noncompliance with other medical treatment and regimen: Secondary | ICD-10-CM

## 2013-05-06 DIAGNOSIS — Z982 Presence of cerebrospinal fluid drainage device: Secondary | ICD-10-CM

## 2013-05-06 DIAGNOSIS — Z720 Tobacco use: Secondary | ICD-10-CM

## 2013-05-06 DIAGNOSIS — I10 Essential (primary) hypertension: Secondary | ICD-10-CM | POA: Diagnosis present

## 2013-05-06 DIAGNOSIS — N19 Unspecified kidney failure: Secondary | ICD-10-CM

## 2013-05-06 DIAGNOSIS — E7800 Pure hypercholesterolemia, unspecified: Secondary | ICD-10-CM | POA: Diagnosis present

## 2013-05-06 DIAGNOSIS — I252 Old myocardial infarction: Secondary | ICD-10-CM

## 2013-05-06 DIAGNOSIS — F17200 Nicotine dependence, unspecified, uncomplicated: Secondary | ICD-10-CM | POA: Diagnosis present

## 2013-05-06 DIAGNOSIS — N186 End stage renal disease: Secondary | ICD-10-CM | POA: Diagnosis present

## 2013-05-06 LAB — COMPREHENSIVE METABOLIC PANEL
AST: 14 U/L (ref 0–37)
Albumin: 4 g/dL (ref 3.5–5.2)
Alkaline Phosphatase: 89 U/L (ref 39–117)
BUN: 34 mg/dL — ABNORMAL HIGH (ref 6–23)
Chloride: 104 mEq/L (ref 96–112)
Potassium: 3.4 mEq/L — ABNORMAL LOW (ref 3.5–5.1)
Sodium: 140 mEq/L (ref 135–145)
Total Protein: 7.8 g/dL (ref 6.0–8.3)

## 2013-05-06 LAB — CBC WITH DIFFERENTIAL/PLATELET
Basophils Absolute: 0 10*3/uL (ref 0.0–0.1)
Basophils Relative: 1 % (ref 0–1)
Eosinophils Absolute: 0.1 10*3/uL (ref 0.0–0.7)
MCH: 27.7 pg (ref 26.0–34.0)
MCHC: 33.4 g/dL (ref 30.0–36.0)
Monocytes Relative: 5 % (ref 3–12)
Neutro Abs: 4.4 10*3/uL (ref 1.7–7.7)
Neutrophils Relative %: 74 % (ref 43–77)
Platelets: 220 10*3/uL (ref 150–400)
RDW: 15.4 % (ref 11.5–15.5)

## 2013-05-06 LAB — PROTIME-INR: Prothrombin Time: 14.2 seconds (ref 11.6–15.2)

## 2013-05-06 LAB — PRO B NATRIURETIC PEPTIDE: Pro B Natriuretic peptide (BNP): 26098 pg/mL — ABNORMAL HIGH (ref 0–125)

## 2013-05-06 LAB — APTT: aPTT: 30 seconds (ref 24–37)

## 2013-05-06 MED ORDER — MORPHINE SULFATE 2 MG/ML IJ SOLN
2.0000 mg | INTRAMUSCULAR | Status: DC | PRN
  Filled 2013-05-06: qty 1

## 2013-05-06 MED ORDER — SODIUM CHLORIDE 0.9 % IJ SOLN: 3.0000 mL | INTRAMUSCULAR | Status: DC | PRN

## 2013-05-06 MED ORDER — METOCLOPRAMIDE HCL 10 MG PO TABS: 10.0000 mg | ORAL_TABLET | Freq: Once | ORAL | Status: DC

## 2013-05-06 MED ORDER — HYDROCODONE-ACETAMINOPHEN 5-325 MG PO TABS
1.0000 | ORAL_TABLET | Freq: Once | ORAL | Status: DC
  Filled 2013-05-06: qty 1

## 2013-05-06 MED ORDER — ONDANSETRON HCL 4 MG PO TABS
4.0000 mg | ORAL_TABLET | Freq: Four times a day (QID) | ORAL | Status: DC | PRN
  Administered 2013-05-08: 4 mg via ORAL
  Filled 2013-05-06: qty 1

## 2013-05-06 MED ORDER — ONDANSETRON HCL 4 MG/2ML IJ SOLN
4.0000 mg | Freq: Once | INTRAMUSCULAR | Status: AC
  Administered 2013-05-06: 4 mg via INTRAVENOUS
  Filled 2013-05-06: qty 2

## 2013-05-06 MED ORDER — OXYCODONE HCL 5 MG PO TABS
5.0000 mg | ORAL_TABLET | ORAL | Status: DC | PRN
  Administered 2013-05-07 – 2013-05-09 (×2): 5 mg via ORAL
  Filled 2013-05-06 (×2): qty 1

## 2013-05-06 MED ORDER — ACETAMINOPHEN 650 MG RE SUPP: 650.0000 mg | Freq: Four times a day (QID) | RECTAL | Status: DC | PRN

## 2013-05-06 MED ORDER — SODIUM CHLORIDE 0.9 % IJ SOLN
3.0000 mL | Freq: Two times a day (BID) | INTRAMUSCULAR | Status: DC
  Administered 2013-05-06 – 2013-05-13 (×11): 3 mL via INTRAVENOUS

## 2013-05-06 MED ORDER — SODIUM CHLORIDE 0.9 % IV SOLN: 250.0000 mL | INTRAVENOUS | Status: DC | PRN

## 2013-05-06 MED ORDER — NICARDIPINE HCL IN NACL 20-0.86 MG/200ML-% IV SOLN
5.0000 mg/h | Freq: Once | INTRAVENOUS | Status: AC
  Administered 2013-05-06: 5 mg/h via INTRAVENOUS
  Filled 2013-05-06: qty 200

## 2013-05-06 MED ORDER — ONDANSETRON HCL 4 MG/2ML IJ SOLN
4.0000 mg | Freq: Four times a day (QID) | INTRAMUSCULAR | Status: DC | PRN
  Administered 2013-05-07 – 2013-05-13 (×6): 4 mg via INTRAVENOUS
  Filled 2013-05-06 (×7): qty 2

## 2013-05-06 MED ORDER — NICARDIPINE HCL IN NACL 20-0.86 MG/200ML-% IV SOLN
10.0000 mg/h | INTRAVENOUS | Status: DC
  Administered 2013-05-06 – 2013-05-07 (×4): 10 mg/h via INTRAVENOUS
  Filled 2013-05-06 (×4): qty 200

## 2013-05-06 MED ORDER — LABETALOL HCL 5 MG/ML IV SOLN
20.0000 mg | Freq: Once | INTRAVENOUS | Status: AC
  Administered 2013-05-06: 20 mg via INTRAVENOUS
  Filled 2013-05-06: qty 4

## 2013-05-06 MED ORDER — ACETAMINOPHEN 325 MG PO TABS
650.0000 mg | ORAL_TABLET | Freq: Four times a day (QID) | ORAL | Status: DC | PRN
  Administered 2013-05-09: 650 mg via ORAL
  Filled 2013-05-06: qty 2

## 2013-05-06 MED ORDER — CLONIDINE HCL 0.1 MG PO TABS
0.3000 mg | ORAL_TABLET | Freq: Once | ORAL | Status: DC
  Filled 2013-05-06: qty 2
  Filled 2013-05-06: qty 1

## 2013-05-06 MED ORDER — LABETALOL HCL 200 MG PO TABS
200.0000 mg | ORAL_TABLET | Freq: Three times a day (TID) | ORAL | Status: DC
  Administered 2013-05-06 – 2013-05-08 (×6): 200 mg via ORAL
  Filled 2013-05-06 (×8): qty 1

## 2013-05-06 MED ORDER — POTASSIUM CHLORIDE CRYS ER 20 MEQ PO TBCR
40.0000 meq | EXTENDED_RELEASE_TABLET | Freq: Once | ORAL | Status: AC
  Administered 2013-05-06: 40 meq via ORAL
  Filled 2013-05-06: qty 2

## 2013-05-06 MED ORDER — MORPHINE SULFATE 4 MG/ML IJ SOLN
4.0000 mg | Freq: Once | INTRAMUSCULAR | Status: AC
  Administered 2013-05-06: 4 mg via INTRAVENOUS
  Filled 2013-05-06: qty 1

## 2013-05-06 MED ORDER — NICARDIPINE HCL IN NACL 20-0.86 MG/200ML-% IV SOLN
5.0000 mg/h | INTRAVENOUS | Status: DC
  Administered 2013-05-06: 5 mg/h via INTRAVENOUS
  Filled 2013-05-06: qty 200

## 2013-05-06 MED ORDER — FUROSEMIDE 10 MG/ML IJ SOLN
60.0000 mg | Freq: Two times a day (BID) | INTRAMUSCULAR | Status: DC
  Administered 2013-05-06 – 2013-05-07 (×2): 60 mg via INTRAVENOUS
  Filled 2013-05-06: qty 6
  Filled 2013-05-06: qty 8
  Filled 2013-05-06: qty 6

## 2013-05-06 NOTE — ED Notes (Signed)
Family at bedside, reports that patient's memory is impaired, that patient was scheduled to have surgery on cerebral vessels to prevent aneurysm but did not show up to surgery and has not received the operation.

## 2013-05-06 NOTE — ED Provider Notes (Signed)
History    CSN: [id number] Arrival date & time 05/06/13  1249  First MD Initiated Contact with Patient 05/06/13 1324     Chief Complaint  Patient presents with  . Emesis  . Headache   (Consider location/radiation/quality/duration/timing/severity/associated sxs/prior Treatment) HPI Pt with history of malignant HTN, SAH 6/13 and multiple intracranial aneurysms presents with HTN, frontal HA and nausea with 2 episodes of vomiting. Pt with gradual onset HA yesterday morning. States it is not similar to when previously dx with SAH. She has been out of her blood pressure medication x 3 days. +blurred vision and generalized weakness without focal weakness, numbness, visual field changes. No fever, chills, neck stiffness.  Past Medical History  Diagnosis Date  . HTN (hypertension)     resistant  . Hypercholesterolemia   . Tobacco abuse     resumed smoking half a pack a day. She was a smoker in the past and states she was abl eto stop in the past using nicotine patches  . CAD (coronary artery disease)     Pt has St elevation MI in Feb 2009. Left hear tcath at tha ttime showed a 70% distal LAD lesion that was hazy consistent with a plaque rupture. She had a 50% distal circumflex stenosis an a 95% distal RCA stenosis. She did havve drug eluting stent placed in her distal RCA  . Chronic kidney disease     most recent creatinine 1.4 in 3/10  . Intracranial hemorrhage, spontaneous intraparenchymal, idiopathic, remote, resolved   . Diastolic heart failure     pt most recent echo was in Feb 2009 w an EF of 60% and severe left ventricular hypertrophy. The pt did have evidence of diastolic dysfunction. The RV appeared normal  . Subarachnoid hemorrhage     03/2012   Past Surgical History  Procedure Laterality Date  . Ventriculoperitoneal shunt  03/27/2012    Procedure: SHUNT INSERTION VENTRICULAR-PERITONEAL;  Surgeon: Winfield Cunas, MD;  Location: [place] [facility];  Service: Neurosurgery;  Laterality:  N/A;  Ventricular-Peritoneal Shunt Insertion  . Cardiac catheterization    . Coronary angioplasty    . Abdominal hysterectomy     Family History  Problem Relation Age of Onset  . Coronary artery disease Other     premature in 1st degree relatives   History  Substance Use Topics  . Smoking status: Current Every Day Smoker -- 0.20 packs/day for 35 years    Types: Cigarettes  . Smokeless tobacco: Never Used     Comment: 1/2 ppd   . Alcohol Use: No   OB History   Grav Para Term Preterm Abortions TAB SAB Ect Mult Living                 Review of Systems  Constitutional: Positive for fatigue. Negative for fever and chills.  HENT: Negative for neck pain and neck stiffness.   Eyes: Positive for visual disturbance.  Respiratory: Positive for shortness of breath. Negative for cough and wheezing.   Cardiovascular: Negative for chest pain.  Gastrointestinal: Positive for nausea and vomiting. Negative for abdominal pain, diarrhea and constipation.  Musculoskeletal: Negative for myalgias and back pain.  Skin: Negative for color change, pallor, rash and wound.  Neurological: Positive for weakness and headaches. Negative for dizziness, syncope, light-headedness and numbness.  Psychiatric/Behavioral: Negative for confusion and agitation.    Allergies  Ampicillin and Penicillins  Home Medications   Current Outpatient Rx  Name  Route  Sig  Dispense  Refill  .  hydrALAZINE (APRESOLINE) 50 MG tablet   Oral   Take 1 tablet (50 mg total) by mouth 3 (three) times daily.   90 tablet   3   . labetalol (NORMODYNE) 200 MG tablet   Oral   Take 1 tablet (200 mg total) by mouth 3 (three) times daily.   90 tablet   3   . nitroGLYCERIN (NITROSTAT) 0.4 MG SL tablet   Sublingual   Place 0.4 mg under the tongue every 5 (five) minutes as needed for chest pain.           BP 230/102  Pulse 75  Temp(Src) 97.6 F (36.4 C) (Oral)  Resp 15  Ht 5\' 1"  (1.549 m)  SpO2 97% Physical Exam   Nursing note and vitals reviewed. Constitutional: She is oriented to person, place, and time. She appears well-developed and well-nourished. No distress.  HENT:  Head: Normocephalic and atraumatic.  Mouth/Throat: Oropharynx is clear and moist.  Eyes: EOM are normal. Pupils are equal, round, and reactive to light.  Neck: Normal range of motion. Neck supple.  No nuchal rigidity  Cardiovascular: Normal rate and regular rhythm.   Pulmonary/Chest: Effort normal and breath sounds normal. No respiratory distress. She has no wheezes. She has no rales.  Abdominal: Soft. Bowel sounds are normal. She exhibits no distension and no mass. There is no tenderness. There is no rebound and no guarding.  Musculoskeletal: Normal range of motion. She exhibits no edema and no tenderness.  Neurological: She is alert and oriented to person, place, and time.  5/5 motor in all ext, sensation intact, finger to nose intact bl.   Skin: Skin is warm and dry. No rash noted. No erythema.  Psychiatric: She has a normal mood and affect. Her behavior is normal.    ED Course  Procedures (including critical care time) Labs Reviewed  COMPREHENSIVE METABOLIC PANEL - Abnormal; Notable for the following:    Potassium 3.4 (*)    CO2 18 (*)    BUN 34 (*)    Creatinine, Ser 3.78 (*)    GFR calc non Af Amer 13 (*)    GFR calc Af Amer 15 (*)    All other components within normal limits  PRO B NATRIURETIC PEPTIDE - Abnormal; Notable for the following:    Pro B Natriuretic peptide (BNP) 26098.0 (*)    All other components within normal limits  CBC WITH DIFFERENTIAL  PROTIME-INR  TROPONIN I  APTT   Ct Head Wo Contrast  05/06/2013   *RADIOLOGY REPORT*  Clinical Data: Headache  CT HEAD WITHOUT CONTRAST  Technique:  Contiguous axial images were obtained from the base of the skull through the vertex without contrast.  Comparison: 09/25/2012  Findings: No evidence for an acute hemorrhage.  No hydrocephalus, midline shift, or  abnormal extra-axial fluid collection.  No CT evidence for acute ischemia.  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  A right sided aneurysm clip is seen again.  The right frontal ventriculostomy shunt catheter is again noted with stable tip position.  Inferior left cerebellar infarct again noted.  Postsurgical change noted in the right frontotemporal skull.  IMPRESSION: Stable.  No new or acute interval findings.   Original Report Authenticated By: Misty Stanley, M.D.   Dg Chest Port 1 View  05/06/2013   *RADIOLOGY REPORT*  Clinical Data: Shortness of breath.  Current history of hypertension.  Current smoker.  PORTABLE CHEST - 1 VIEW 05/06/2013 1419 hours:  Comparison: Portable  chest x-ray 04/30/2012 dating back to 03/05/2012.  Findings: Cardiac silhouette moderately enlarged but stable. Pulmonary venous hypertension and scattered Kerley B lines consistent with minimal interstitial pulmonary edema.  No visible pleural effusions.  No confluent airspace consolidation. Ventriculoperitoneal shunt catheter overlies the right neck, chest, and upper abdomen.  IMPRESSION: Stable moderate cardiomegaly.  Minimal interstitial pulmonary edema consistent with CHF.   Original Report Authenticated By: Evangeline Dakin, M.D.   1. Hypertensive emergency   2. Renal failure   3. CHF (congestive heart failure), acute on chronic, diastolic     Date: [id number]  Rate: 84  Rhythm: normal sinus rhythm  QRS Axis: normal  Intervals: normal  ST/T Wave abnormalities: nonspecific T wave changes  Conduction Disutrbances:none  Narrative Interpretation:   Old EKG Reviewed: unchanged   MDM  [facility] to see in ED and admit. Pt remains neurologically stable.   Julianne Rice, MD 05/06/13 [phone]

## 2013-05-06 NOTE — ED Notes (Addendum)
Patient with history of subarachnoid hemorrhage and HTN reports to the ED for nausea, emesis, and anterior headache starting yesterday. Patient reports that three vessels in her brain need operation for aneurisms. Patient denies abdominal pain, blurred vision, dizziness. Patient reports that she is currently out of her blood pressure medications.

## 2013-05-06 NOTE — Consult Note (Signed)
CONSULT NOTE  Date: 05/06/2013               Patient Name:  [name] MRN: [id number]  DOB: 02-18-1961 Age / Sex: 52 y.o., female        PCP: No primary provider on file. Primary Cardiologist: Loralie Champagne, MD            Referring Physician: [facility] , Felipe Drone, MD              Reason for Consult: CHf,malignant htn           History of Present Illness: Patient is a 52 y.o. female with a PMHx of HTN, diastolic CHF, CAD , who was admitted to [facility] on 05/06/2013 for evaluation of malignant HTN.  She has a hx of noncompliance in the past. She ran out of her meds 3 days ago.  Presents with worsening dyspnea.  In the [facility] ER, she has received lasix 60 ,nicardipine drip.  Labetalol IV.   BP has decresed to 185/95  .  Medications: Outpatient medications:  (Not in a hospital admission)  Current medications: Current Facility-Administered Medications  Medication Dose Route Frequency Provider Last Rate Last Dose  . 0.9 %  sodium chloride infusion  250 mL Intravenous PRN Monika Salk, MD      . acetaminophen (TYLENOL) tablet 650 mg  650 mg Oral Q6H PRN Monika Salk, MD       Or  . acetaminophen (TYLENOL) suppository 650 mg  650 mg Rectal Q6H PRN Monika Salk, MD      . furosemide (LASIX) injection 60 mg  60 mg Intravenous BID Monika Salk, MD      . morphine 2 MG/ML injection 2 mg  2 mg Intravenous Q4H PRN Monika Salk, MD      . niCARdipine (CARDENE-IV) infusion (0.1 mg/ml)  5 mg/hr Intravenous Continuous Monika Salk, MD      . ondansetron [facility]) tablet 4 mg  4 mg Oral Q6H PRN Monika Salk, MD       Or  . ondansetron [facility]) injection 4 mg  4 mg Intravenous Q6H PRN Monika Salk, MD      . oxyCODONE (Oxy IR/ROXICODONE) immediate release tablet 5 mg  5 mg Oral Q4H PRN Monika Salk, MD      . potassium chloride SA (K-DUR,KLOR-CON) CR tablet 40 mEq  40 mEq Oral Once Monika Salk, MD      . sodium chloride 0.9 % injection 3 mL  3 mL Intravenous Q12H Monika Salk, MD      . sodium chloride 0.9  % injection 3 mL  3 mL Intravenous PRN Monika Salk, MD       Current Outpatient Prescriptions  Medication Sig Dispense Refill  . hydrALAZINE (APRESOLINE) 50 MG tablet Take 1 tablet (50 mg total) by mouth 3 (three) times daily.  90 tablet  3  . labetalol (NORMODYNE) 200 MG tablet Take 1 tablet (200 mg total) by mouth 3 (three) times daily.  90 tablet  3  . nitroGLYCERIN (NITROSTAT) 0.4 MG SL tablet Place 0.4 mg under the tongue every 5 (five) minutes as needed for chest pain.          Allergies  Allergen Reactions  . Ampicillin Other (See Comments)    unknown  . Penicillins Other (See Comments)    unknown     Past Medical History  Diagnosis Date  . HTN (hypertension)  resistant  . Hypercholesterolemia   . Tobacco abuse     resumed smoking half a pack a day. She was a smoker in the past and states she was abl eto stop in the past using nicotine patches  . CAD (coronary artery disease)     Pt has St elevation MI in Feb 2009. Left hear tcath at tha ttime showed a 70% distal LAD lesion that was hazy consistent with a plaque rupture. She had a 50% distal circumflex stenosis an a 95% distal RCA stenosis. She did havve drug eluting stent placed in her distal RCA  . Chronic kidney disease     most recent creatinine 1.4 in 3/10  . Intracranial hemorrhage, spontaneous intraparenchymal, idiopathic, remote, resolved   . Diastolic heart failure     pt most recent echo was in Feb 2009 w an EF of 60% and severe left ventricular hypertrophy. The pt did have evidence of diastolic dysfunction. The RV appeared normal  . Subarachnoid hemorrhage     03/2012    Past Surgical History  Procedure Laterality Date  . Ventriculoperitoneal shunt  03/27/2012    Procedure: SHUNT INSERTION VENTRICULAR-PERITONEAL;  Surgeon: Winfield Cunas, MD;  Location: [place] [facility];  Service: Neurosurgery;  Laterality: N/A;  Ventricular-Peritoneal Shunt Insertion  . Cardiac catheterization    . Coronary angioplasty    .  Abdominal hysterectomy      Family History  Problem Relation Age of Onset  . Coronary artery disease Other     premature in 1st degree relatives    Social History:  reports that she has been smoking Cigarettes.  She has a 7 pack-year smoking history. She has never used smokeless tobacco. She reports that she does not drink alcohol or use illicit drugs.   Review of Systems: Constitutional:  denies fever, chills, diaphoresis, appetite change and fatigue.  HEENT: denies photophobia, eye pain, redness, hearing loss, ear pain, congestion, sore throat, rhinorrhea, sneezing, neck pain, neck stiffness and tinnitus.  Respiratory: admits to SOB, DOE, cough, chest tightness, and wheezing.  Cardiovascular: denies chest pain, palpitations and leg swelling.  Gastrointestinal: denies nausea, vomiting, abdominal pain, diarrhea, constipation, blood in stool.  Genitourinary: denies dysuria, urgency, frequency, hematuria, flank pain and difficulty urinating.  Musculoskeletal: denies  myalgias, back pain, joint swelling, arthralgias and gait problem.   Skin: denies pallor, rash and wound.  Neurological: denies dizziness, seizures, syncope, weakness, light-headedness, numbness and headaches.   Hematological: denies adenopathy, easy bruising, personal or family bleeding history.  Psychiatric/ Behavioral: denies suicidal ideation, mood changes, confusion, nervousness, sleep disturbance and agitation.    Physical Exam: BP 185/92  Pulse 78  Temp(Src) 97.6 F (36.4 C) (Oral)  Resp 20  Ht 5\' 1"  (1.549 m)  SpO2 96%  General: Vital signs reviewed and noted. Chronically ill appearing   Head: Normocephalic, atraumatic, sclera anicteric, mucus membranes are moist  Neck: Supple. Negative for carotid bruits. JVD not elevated.  Lungs:  Few rales   Heart: RRR with S1 S2.  Soft systolic murmur  Abdomen:  Soft, non-tender, non-distended with normoactive bowel sounds. No hepatomegaly. No rebound/guarding. No  obvious abdominal masses  MSK: Strength and the appear normal for age.  Extremities: No clubbing or cyanosis. No edema.  Distal pedal pulses are 2+ and equal bilaterally.  Neurologic: Alert and oriented X 3. Moves all extremities spontaneously.  Psych: Responds to questions appropriately with a normal affect.    Lab results: Basic Metabolic Panel:  Recent Labs Lab 05/06/13 1410  NA  140  K 3.4*  CL 104  CO2 18*  GLUCOSE 90  BUN 34*  CREATININE 3.78*  CALCIUM 9.7    Liver Function Tests:  Recent Labs Lab 05/06/13 1410  AST 14  ALT 11  ALKPHOS 89  BILITOT 0.5  PROT 7.8  ALBUMIN 4.0   No results found for this basename: LIPASE, AMYLASE,  in the last 168 hours No results found for this basename: AMMONIA,  in the last 168 hours  CBC:  Recent Labs Lab 05/06/13 1410  WBC 6.0  NEUTROABS 4.4  HGB 12.5  HCT 37.4  MCV 82.9  PLT 220    Cardiac Enzymes:  Recent Labs Lab 05/06/13 1410  TROPONINI <0.30    BNP: No components found with this basename: POCBNP,   CBG: No results found for this basename: GLUCAP,  in the last 168 hours  Coagulation Studies:  Recent Labs  05/06/13 1410  LABPROT 14.2  INR 1.12     Other results: ECG:   NSR, LVH with repol.  Strain pattern.   Imaging: Ct Head Wo Contrast  05/06/2013   *RADIOLOGY REPORT*  Clinical Data: Headache  CT HEAD WITHOUT CONTRAST  Technique:  Contiguous axial images were obtained from the base of the skull through the vertex without contrast.  Comparison: 09/25/2012  Findings: No evidence for an acute hemorrhage.  No hydrocephalus, midline shift, or abnormal extra-axial fluid collection.  No CT evidence for acute ischemia.  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  A right sided aneurysm clip is seen again.  The right frontal ventriculostomy shunt catheter is again noted with stable tip position.  Inferior left cerebellar infarct again noted.  Postsurgical change noted in the right  frontotemporal skull.  IMPRESSION: Stable.  No new or acute interval findings.   Original Report Authenticated By: Misty Stanley, M.D.   Dg Chest Port 1 View  05/06/2013   *RADIOLOGY REPORT*  Clinical Data: Shortness of breath.  Current history of hypertension.  Current smoker.  PORTABLE CHEST - 1 VIEW 05/06/2013 1419 hours:  Comparison: Portable chest x-ray 04/30/2012 dating back to 03/05/2012.  Findings: Cardiac silhouette moderately enlarged but stable. Pulmonary venous hypertension and scattered Kerley B lines consistent with minimal interstitial pulmonary edema.  No visible pleural effusions.  No confluent airspace consolidation. Ventriculoperitoneal shunt catheter overlies the right neck, chest, and upper abdomen.  IMPRESSION: Stable moderate cardiomegaly.  Minimal interstitial pulmonary edema consistent with CHF.   Original Report Authenticated By: Evangeline Dakin, M.D.       Assessment & Plan:  1. Acute on chronic diastolic CHF.  Secondary to noncompliance.   She stopped her meds / ran out 3 days ago.   Her BP was initially 290.  Now 180.   Agree with current therapy - lasix, would also add lebetalol drip or give IV boluses if needed for more BP control  i have added her PO labetalol.  This is an unfortunate situation - she needs to take her medications.  i have advised her to stop smoking.     Thayer Headings, Brooke Bonito., MD, [facility] 05/06/2013, 5:15 PM

## 2013-05-06 NOTE — ED Notes (Signed)
IV insertion attempted without success. Second RN attempting IV placement.

## 2013-05-06 NOTE — Progress Notes (Signed)
Pt does not want to sign up for My Chart. Lucius Conn BSN, RN-BC Admissions RN  05/06/2013 3:20 PM

## 2013-05-06 NOTE — ED Notes (Addendum)
Diane Mains, MD, made aware of blood pressure.

## 2013-05-06 NOTE — H&P (Signed)
PCP:   No primary provider on file.   Chief Complaint:  Severe headache x 3 days.   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 According to patient she had been compliant with her medication, but ran out about 3 days a go, and was unable to get refill, for insurance reasons. On 05/04/13, she developed a frontal headache, which has persisted, unassociated with visual obscurations. Since 05/05/13, she has had recurrent vomiting, up to 4-5 times daily, including today. Although she is intermittently SOB, this has been going on for about 6 months, and is no worse at this time. She denies chest pain, cough, fever or chills. In the ED, BP was 298/160-230/102. Head CT scan was devoid of acute findings, and CXR revealed stable moderate cardiomegaly. Minimal interstitial pulmonary edema consistent with CHF. [place] was [place] at [address].    Allergies:   Allergies  Allergen Reactions  . Ampicillin Other (See Comments)    unknown  . Penicillins Other (See Comments)    unknown      Past Medical History  Diagnosis Date  . HTN (hypertension)     resistant  . Hypercholesterolemia   . Tobacco abuse     resumed smoking half a pack a day. She was a smoker in the past and states she was abl eto stop in the past using nicotine patches  . CAD (coronary artery disease)     Pt has St elevation MI in Feb 2009. Left hear tcath at tha ttime showed a 70% distal LAD lesion that was hazy consistent with a plaque rupture. She had a 50% distal circumflex stenosis an a 95% distal RCA stenosis. She did havve drug eluting stent placed in her distal RCA  . Chronic kidney disease     most recent creatinine 1.4 in 3/10  . Intracranial hemorrhage, spontaneous intraparenchymal, idiopathic, remote, resolved   . Diastolic heart  failure     pt most recent echo was in Feb 2009 w an EF of 60% and severe left ventricular hypertrophy. The pt did have evidence of diastolic dysfunction. The RV appeared normal  . Subarachnoid hemorrhage     03/2012    Past Surgical History  Procedure Laterality Date  . Ventriculoperitoneal shunt  03/27/2012    Procedure: SHUNT INSERTION VENTRICULAR-PERITONEAL;  Surgeon: Winfield Cunas, MD;  Location: [facility];  Service: Neurosurgery;  Laterality: N/A;  Ventricular-Peritoneal Shunt Insertion  . Cardiac catheterization    . Coronary angioplasty    . Abdominal hysterectomy      Prior to Admission medications   Medication Sig Start Date End Date Taking? Authorizing Provider  hydrALAZINE (APRESOLINE) 50 MG tablet Take 1 tablet (50 mg total) by mouth 3 (three) times daily. 09/28/12  Yes Ripudeep Krystal Eaton, MD  labetalol (NORMODYNE) 200 MG tablet Take 1 tablet (200 mg total) by mouth 3 (three) times daily. 09/28/12  Yes Ripudeep Krystal Eaton, MD  nitroGLYCERIN (NITROSTAT) 0.4 MG SL tablet Place 0.4 mg under the tongue every 5 (five) minutes as needed for chest pain.  04/27/12  Yes Sorin June Leap, MD    Social History: Patient reports that she has been smoking Cigarettes.  She has a 7 pack-year smoking history. She has never used smokeless tobacco. She reports that she does not drink alcohol or use illicit drugs.  Family  History  Problem Relation Age of Onset  . Coronary artery disease Other     premature in 1st degree relatives    Review of Systems:  As per HPI and chief complaint. Patent denies fatigue, diminished appetite, weight loss, fever, chills, blurred vision, difficulty in speaking, dysphagia, chest pain, cough, orthopnea, paroxysmal nocturnal dyspnea, diaphoresis, abdominal pain, diarrhea, belching, heartburn, hematemesis, melena, dysuria, nocturia, urinary frequency, hematochezia, lower extremity swelling, pain, or redness. The rest of the systems review is negative.  Physical  Exam:  General:  Patient does not appear to be in obvious acute distress. Alert, communicative, fully oriented, talking in complete sentences, not short of breath at rest.  HEENT:  No clinical pallor, no jaundice, no conjunctival injection or discharge. NECK:  Supple, JVP not seen, no carotid bruits, no palpable lymphadenopathy, no palpable goiter. CHEST:  Clinically clear to auscultation, no wheezes, no crackles. HEART:  Sounds 1 and 2 heard, normal, regular, no murmurs. ABDOMEN:  Full, soft, non-tender, no palpable organomegaly, no palpable masses, normal bowel sounds. GENITALIA:  Not examined. LOWER EXTREMITIES:  No pitting edema, palpable peripheral pulses. MUSCULOSKELETAL SYSTEM:  Unremarkable. CENTRAL NERVOUS SYSTEM:  No focal neurologic deficit on gross examination.  Labs on Admission:  Results for orders placed during the hospital encounter of 05/06/13 (from the past 48 hour(s))  CBC WITH DIFFERENTIAL     Status: None   Collection Time    05/06/13  2:10 PM      Result Value Range   WBC 6.0  4.0 - 10.5 K/uL   RBC 4.51  3.87 - 5.11 MIL/uL   Hemoglobin 12.5  12.0 - 15.0 g/dL   HCT 37.4  36.0 - 46.0 %   MCV 82.9  78.0 - 100.0 fL   MCH 27.7  26.0 - 34.0 pg   MCHC 33.4  30.0 - 36.0 g/dL   RDW 15.4  11.5 - 15.5 %   Platelets 220  150 - 400 K/uL   Neutrophils Relative % 74  43 - 77 %   Neutro Abs 4.4  1.7 - 7.7 K/uL   Lymphocytes Relative 20  12 - 46 %   Lymphs Abs 1.2  0.7 - 4.0 K/uL   Monocytes Relative 5  3 - 12 %   Monocytes Absolute 0.3  0.1 - 1.0 K/uL   Eosinophils Relative 1  0 - 5 %   Eosinophils Absolute 0.1  0.0 - 0.7 K/uL   Basophils Relative 1  0 - 1 %   Basophils Absolute 0.0  0.0 - 0.1 K/uL  COMPREHENSIVE METABOLIC PANEL     Status: Abnormal   Collection Time    05/06/13  2:10 PM      Result Value Range   Sodium 140  135 - 145 mEq/L   Potassium 3.4 (*) 3.5 - 5.1 mEq/L   Chloride 104  96 - 112 mEq/L   CO2 18 (*) 19 - 32 mEq/L   Glucose, Bld 90  70 - 99 mg/dL    BUN 34 (*) 6 - 23 mg/dL   Creatinine, Ser 3.78 (*) 0.50 - 1.10 mg/dL   Calcium 9.7  8.4 - 10.5 mg/dL   Total Protein 7.8  6.0 - 8.3 g/dL   Albumin 4.0  3.5 - 5.2 g/dL   AST 14  0 - 37 U/L   ALT 11  0 - 35 U/L   Alkaline Phosphatase 89  39 - 117 U/L   Total Bilirubin 0.5  0.3 - 1.2 mg/dL  GFR calc non Af Amer 13 (*) >90 mL/min   GFR calc Af Amer 15 (*) >90 mL/min   Comment:            The eGFR has been calculated     using the CKD EPI equation.     This calculation has not been     validated in all clinical     situations.     eGFR's persistently     <90 mL/min signify     possible Chronic Kidney Disease.  PROTIME-INR     Status: None   Collection Time    05/06/13  2:10 PM      Result Value Range   Prothrombin Time 14.2  11.6 - 15.2 seconds   INR 1.12  0.00 - 1.49  TROPONIN I     Status: None   Collection Time    05/06/13  2:10 PM      Result Value Range   Troponin I <0.30  <0.30 ng/mL   Comment:            Due to the release kinetics of cTnI,     a negative result within the first hours     of the onset of symptoms does not rule out     myocardial infarction with certainty.     If myocardial infarction is still suspected,     repeat the test at appropriate intervals.  APTT     Status: None   Collection Time    05/06/13  2:10 PM      Result Value Range   aPTT 30  24 - 37 seconds  PRO B NATRIURETIC PEPTIDE     Status: Abnormal   Collection Time    05/06/13  2:10 PM      Result Value Range   Pro B Natriuretic peptide (BNP) 26098.0 (*) 0 - 125 pg/mL    Radiological Exams on Admission: Ct Head Wo Contrast  05/06/2013   *RADIOLOGY REPORT*  Clinical Data: Headache  CT HEAD WITHOUT CONTRAST  Technique:  Contiguous axial images were obtained from the base of the skull through the vertex without contrast.  Comparison: 09/25/2012  Findings: No evidence for an acute hemorrhage.  No hydrocephalus, midline shift, or abnormal extra-axial fluid collection.  No CT evidence for  acute ischemia.  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  A right sided aneurysm clip is seen again.  The right frontal ventriculostomy shunt catheter is again noted with stable tip position.  Inferior left cerebellar infarct again noted.  Postsurgical change noted in the right frontotemporal skull.  IMPRESSION: Stable.  No new or acute interval findings.   Original Report Authenticated By: Misty Stanley, M.D.   Dg Chest Port 1 View  05/06/2013   *RADIOLOGY REPORT*  Clinical Data: Shortness of breath.  Current history of hypertension.  Current smoker.  PORTABLE CHEST - 1 VIEW 05/06/2013 1419 hours:  Comparison: Portable chest x-ray 04/30/2012 dating back to 03/05/2012.  Findings: Cardiac silhouette moderately enlarged but stable. Pulmonary venous hypertension and scattered Kerley B lines consistent with minimal interstitial pulmonary edema.  No visible pleural effusions.  No confluent airspace consolidation. Ventriculoperitoneal shunt catheter overlies the right neck, chest, and upper abdomen.  IMPRESSION: Stable moderate cardiomegaly.  Minimal interstitial pulmonary edema consistent with CHF.   Original Report Authenticated By: Evangeline Dakin, M.D.    Assessment/Plan Active Problems:    1. HTN (hypertension), malignant: Patient presented with severe uncontrolled HTN (298/160-230/102), associated  with frontal headache and vomiting. Clinically, this is consistent with accelerated malignant HTN, in the setting of medication non-compliance. Patient has already been administered iv Labetalol in the ED, but will commence ivi Nicardipine, titrated for BP control. As patient also has CHF decompensation, she will need very close monitoring, ideally in ICU setting till more stable.  2. CHF (congestive heart failure): Patient has had intermittent SOB for several months CXR confirmed moderate cardiomegaly and minimal interstitial pulmonary edema consistent with CHF. ProBNP is markedly  elevated at [address]. We shall place patient o iv Lasix, and arrange 2D Echocardiogram.  3. CAD (coronary artery disease): Patient has known CAD, s/p NSTEMI 2009, s/p DES. She has no chest pain at this time, but in view of #2 above, will monitor telemetrically, cycle cardiac enzymes. She will benefit form cardiology consultation. We shall consult Dr Ernst Spell al. 12-Lead EKG is devoid of acute ischemic changes.  4. CKD (chronic kidney disease), stage IV: Baseline creatinine is 2.21-2.56 in 09/2012. At present, creatinine is 3.78, consistent with progression of renal failure. Will need nephrology consultation. Meanwhile, for renal U/S.   Further management will depend on clinical course.   Comment: Patient is FULL CODE.    Time Spent on Admission: 1 hour.   Wen Merced,CHRISTOPHER 05/06/2013, 4:27 PM

## 2013-05-06 NOTE — Progress Notes (Signed)
[facility] Progress Note Patient Name: [name] DOB: [birth year] MRN: [id number]  Date of Service  05/06/2013   HPI/Events of Note   BP elevated , cardene was shut off  eICU Interventions  Resume cardene drip at 10mg /hr   Intervention Category Major Interventions: Hypertension - evaluation and management  Asencion Noble 05/06/2013, 6:25 PM

## 2013-05-06 NOTE — ED Notes (Signed)
X-ray at bedside

## 2013-05-07 ENCOUNTER — Inpatient Hospital Stay (HOSPITAL_COMMUNITY): Payer: No Typology Code available for payment source

## 2013-05-07 DIAGNOSIS — I119 Hypertensive heart disease without heart failure: Secondary | ICD-10-CM | POA: Diagnosis present

## 2013-05-07 DIAGNOSIS — N179 Acute kidney failure, unspecified: Secondary | ICD-10-CM | POA: Diagnosis present

## 2013-05-07 DIAGNOSIS — I2510 Atherosclerotic heart disease of native coronary artery without angina pectoris: Secondary | ICD-10-CM

## 2013-05-07 DIAGNOSIS — I10 Essential (primary) hypertension: Secondary | ICD-10-CM

## 2013-05-07 LAB — RENAL FUNCTION PANEL
Calcium: 9.2 mg/dL (ref 8.4–10.5)
Creatinine, Ser: 4.07 mg/dL — ABNORMAL HIGH (ref 0.50–1.10)
GFR calc Af Amer: 14 mL/min — ABNORMAL LOW (ref >90)
GFR calc non Af Amer: 12 mL/min — ABNORMAL LOW (ref >90)
Phosphorus: 4.2 mg/dL (ref 2.3–4.6)
Sodium: 141 mEq/L (ref 135–145)

## 2013-05-07 LAB — COMPREHENSIVE METABOLIC PANEL
ALT: 9 U/L (ref 0–35)
Alkaline Phosphatase: 76 U/L (ref 39–117)
BUN: 34 mg/dL — ABNORMAL HIGH (ref 6–23)
CO2: 18 mEq/L — ABNORMAL LOW (ref 19–32)
Chloride: 108 mEq/L (ref 96–112)
GFR calc Af Amer: 14 mL/min — ABNORMAL LOW (ref >90)
Glucose, Bld: 103 mg/dL — ABNORMAL HIGH (ref 70–99)
Potassium: 3.6 mEq/L (ref 3.5–5.1)
Total Bilirubin: 0.3 mg/dL (ref 0.3–1.2)

## 2013-05-07 LAB — URINALYSIS, ROUTINE W REFLEX MICROSCOPIC
Bilirubin Urine: NEGATIVE
Glucose, UA: NEGATIVE mg/dL
Hgb urine dipstick: NEGATIVE
Protein, ur: 30 mg/dL — AB
Specific Gravity, Urine: 1.016 (ref 1.005–1.030)
Urobilinogen, UA: 0.2 mg/dL (ref 0.0–1.0)

## 2013-05-07 LAB — URINE MICROSCOPIC-ADD ON

## 2013-05-07 LAB — PRO B NATRIURETIC PEPTIDE: Pro B Natriuretic peptide (BNP): 20527 pg/mL — ABNORMAL HIGH (ref 0–125)

## 2013-05-07 LAB — HEPATITIS B CORE ANTIBODY, IGM: Hep B C IgM: NEGATIVE

## 2013-05-07 LAB — IRON AND TIBC
Iron: 51 ug/dL (ref 42–135)
TIBC: 228 ug/dL — ABNORMAL LOW (ref 250–470)

## 2013-05-07 LAB — CBC
HCT: 32.9 % — ABNORMAL LOW (ref 36.0–46.0)
MCH: 28.1 pg (ref 26.0–34.0)
MCHC: 33.4 g/dL (ref 30.0–36.0)
RDW: 15.8 % — ABNORMAL HIGH (ref 11.5–15.5)

## 2013-05-07 LAB — TROPONIN I
Troponin I: <0.3 ng/mL (ref <0.30)
Troponin I: <0.3 ng/mL (ref <0.30)

## 2013-05-07 MED ORDER — NICARDIPINE HCL IN NACL 20-0.86 MG/200ML-% IV SOLN
10.0000 mg/h | INTRAVENOUS | Status: DC
  Administered 2013-05-07: 10 mg/h via INTRAVENOUS
  Filled 2013-05-07: qty 200

## 2013-05-07 MED ORDER — HYDRALAZINE HCL 50 MG PO TABS
50.0000 mg | ORAL_TABLET | Freq: Three times a day (TID) | ORAL | Status: DC
  Administered 2013-05-07 – 2013-05-11 (×13): 50 mg via ORAL
  Filled 2013-05-07 (×16): qty 1

## 2013-05-07 MED ORDER — NITROGLYCERIN 0.4 MG SL SUBL: 0.4000 mg | SUBLINGUAL_TABLET | SUBLINGUAL | Status: DC | PRN

## 2013-05-07 MED ORDER — FUROSEMIDE 80 MG PO TABS
80.0000 mg | ORAL_TABLET | Freq: Two times a day (BID) | ORAL | Status: DC
  Administered 2013-05-07 – 2013-05-09 (×4): 80 mg via ORAL
  Filled 2013-05-07 (×6): qty 1

## 2013-05-07 MED ORDER — NICOTINE 21 MG/24HR TD PT24
21.0000 mg | MEDICATED_PATCH | Freq: Every day | TRANSDERMAL | Status: DC
  Administered 2013-05-07 – 2013-05-14 (×8): 21 mg via TRANSDERMAL
  Filled 2013-05-07 (×8): qty 1

## 2013-05-07 NOTE — Progress Notes (Signed)
[facility] PROGRESS NOTE  Irja Marrazzo [id number] DOB: 1960/11/21 DOA: 05/06/2013 PCP: No primary provider on file.  Assessment/Plan: Active Problems:   HTN (hypertension), malignant   CHF (congestive heart failure)   CAD (coronary artery disease)   CKD (chronic kidney disease), stage IV    1. HTN (hypertension), malignant: Patient presented with severe uncontrolled HTN (298/160-230/102), associated with frontal headache and vomiting. Clinically, this is consistent with accelerated malignant HTN, in the setting of medication non-compliance. Head Ct scan was devoid of acute findings. Patient was administered iv Labetalol in the ED, then commenced on ivi Nicardipine, titrated for BP control. Today, BP has normalized at 130/65 this AM. Have discontinued Nicardipine infusion, and transitioned to pre-admission antihypertensives. Will adjust as indicated.  2. CHF (congestive heart failure): Patient has had intermittent SOB for several months. CXR confirmed moderate cardiomegaly and minimal interstitial pulmonary edema consistent with CHF. ProBNP was markedly elevated at 260[address]. Managing with iv Lasix. Cardiac enzymes are unelevated and patient has no chest pain. 2D Echocardiogram is pending, and ProBNP is trending down. PCXR this AM, is unchanged. Dr Acie Fredrickson provided cardiology consultation. Input is much appreciated.  3. CAD (coronary artery disease): Patient has known CAD, s/p NSTEMI 2009, s/p DES. 12-Lead EKG is devoid of acute ischemic changes. No clinical evidence of ACS at this time.  4. CKD (chronic kidney disease), stage IV: Baseline creatinine is 2.21-2.56 in 09/2012. At presentation, creatinine was 3.78, consistent with progression of renal failure. Renal U/S revealed increased renal parenchymal echogenicity bilaterally, consistent with medical renal disease. No hydronephrosis. Two small left renal cysts. Creatinine is 4.03 today. Will consult nephrology.  5. Tobacco Abuse: Patient  admittedly, smokes a pack of cigarettes per day. Counseled appropriately, and commenced on Nicoderm CQ patch.    Code Status: Full Code. Family Communication:  Disposition Plan: To be determined. Stable for transfer to telemetry floor today.    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 According to patient she had been compliant with her medication, but ran out about 3 days a go, and was unable to get refill, for insurance reasons. On 05/04/13, she developed a frontal headache, which has persisted, unassociated with visual obscurations. Since 05/05/13, she has had recurrent vomiting, up to 4-5 times daily, including today. Although she is intermittently SOB, this has been going on for about 6 months, and is no worse at this time. She denies chest pain, cough, fever or chills. In the ED, BP was 298/160-230/102. Head CT scan was devoid of acute findings, and CXR revealed stable moderate cardiomegaly. Minimal interstitial pulmonary edema consistent with CHF. ProBNP was markedly elevated at 26098.    Consultants:  Dr Acie Fredrickson, Cardiologist.  Dr Alric Quan, PCCM.   Procedures:  CXR.  Head CT scan.  Antibiotics:  N/A.   HPI/Subjective: Asymptomatic this AM.   Objective: Vital signs in last 24 hours: Temp:  [97.5 F (36.4 C)-98.3 F (36.8 C)] 97.6 F (36.4 C) (07/19 0400) Pulse Rate:  [65-82] 66 (07/19 0700) Resp:  [13-24] 15 (07/19 0700) BP: (128-298)/(63-160) 130/65 mmHg (07/19 0700) SpO2:  [94 %-100 %] 94 % (07/19 0700) Weight:  [64.7 kg (142 lb 10.2 oz)] 64.7 kg (142 lb 10.2 oz) (07/18 1815) Weight change:  Last BM Date: 05/05/13  Intake/Output from previous day: 07/18 0701 - 07/19 0700 In: 810 [  I.V.:810] Out: 700 [Urine:700]     Physical Exam: General: Alert,  communicative, fully oriented, talking in complete sentences, not short of breath at rest.  HEENT: No clinical pallor, no jaundice, no conjunctival injection or discharge.  NECK: Supple, JVP not seen, no carotid bruits, no palpable lymphadenopathy, no palpable goiter.  CHEST: Clinically clear to auscultation, no wheezes, no crackles.  HEART: Sounds 1 and 2 heard, normal, regular, no murmurs.  ABDOMEN: Full, soft, non-tender, no palpable organomegaly, no palpable masses, normal bowel sounds.  GENITALIA: Not examined.  LOWER EXTREMITIES: No pitting edema, palpable peripheral pulses.  MUSCULOSKELETAL SYSTEM: Unremarkable.  CENTRAL NERVOUS SYSTEM: No focal neurologic deficit on gross examination.  Lab Results:  Recent Labs  05/06/13 1410 05/07/13 0500  WBC 6.0 4.6  HGB 12.5 11.0*  HCT 37.4 32.9*  PLT 220 208    Recent Labs  05/06/13 1410 05/07/13 0500  NA 140 141  141  K 3.4* 3.6  3.6  CL 104 108  108  CO2 18* 18*  18*  GLUCOSE 90 103*  104*  BUN 34* 34*  34*  CREATININE 3.78* 4.03*  4.07*  CALCIUM 9.7 9.2  9.2   Recent Results (from the past 240 hour(s))  MRSA PCR SCREENING     Status: None   Collection Time    05/06/13  6:29 PM      Result Value Range Status   MRSA by PCR NEGATIVE  NEGATIVE Final   Comment:            The GeneXpert MRSA Assay (FDA     approved for NASAL specimens     only), is one component of a     comprehensive MRSA colonization     surveillance program. It is not     intended to diagnose MRSA     infection nor to guide or     monitor treatment for     MRSA infections.     Studies/Results: Ct Head Wo Contrast  05/06/2013   *RADIOLOGY REPORT*  Clinical Data: Headache  CT HEAD WITHOUT CONTRAST  Technique:  Contiguous axial images were obtained from the base of the skull through the vertex without contrast.  Comparison: 09/25/2012  Findings: No evidence for an acute hemorrhage.  No hydrocephalus, midline shift, or abnormal extra-axial  fluid collection.  No CT evidence for acute ischemia.  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  A right sided aneurysm clip is seen again.  The right frontal ventriculostomy shunt catheter is again noted with stable tip position.  Inferior left cerebellar infarct again noted.  Postsurgical change noted in the right frontotemporal skull.  IMPRESSION: Stable.  No new or acute interval findings.   Original Report Authenticated By: Misty Stanley, M.D.   US Renal  05/06/2013   *RADIOLOGY REPORT*  Clinical Data: Acute renal failure superimposed on chronic kidney disease.  RENAL/URINARY TRACT ULTRASOUND COMPLETE  Comparison:  None.  Findings:  Right Kidney:  Right kidney shows diffusely increased parenchymal echogenicity.  It measures 9.8 cm in length.  No right renal mass or stone or hydronephrosis.  Left Kidney:  Left kidney is increased in echogenicity diffusely. There are to lower pole cyst seen laterally, one measuring 9 mm and the other measuring 14 mm in size.  The left kidney measures 9.9 cm in length.  Bladder:  The bladder is unremarkable.  IMPRESSION: Increased renal parenchymal echogenicity bilaterally is consistent with medical renal disease.  No hydronephrosis.  Two small  left renal cysts.   Original Report Authenticated By: Lajean Manes, M.D.   Dg Chest Port 1 View  05/07/2013   *RADIOLOGY REPORT*  Clinical Data: CHF  PORTABLE CHEST - 1 VIEW  Comparison: Prior radiograph from 05/06/2013  Findings:  VP shunt catheter tubing is again seen coursing over the right anterior chest.  Cardiomegaly is not significantly changed as compared to the prior examination.  The lungs are mildly hypoinflated.  Diffuse prominence of the interstitial markings with a few scattered Kerley lines are not significantly changed as compared to the prior study. There is question of a left pleural effusion.  No pneumothorax.  No soft tissues and osseous structures are unchanged.  IMPRESSION: Stable  cardiomegaly and mild interstitial pulmonary edema, consistent with CHF.   Original Report Authenticated By: Jeannine Boga, M.D.   Dg Chest Port 1 View  05/06/2013   *RADIOLOGY REPORT*  Clinical Data: Shortness of breath.  Current history of hypertension.  Current smoker.  PORTABLE CHEST - 1 VIEW 05/06/2013 1419 hours:  Comparison: Portable chest x-ray 04/30/2012 dating back to 03/05/2012.  Findings: Cardiac silhouette moderately enlarged but stable. Pulmonary venous hypertension and scattered Kerley B lines consistent with minimal interstitial pulmonary edema.  No visible pleural effusions.  No confluent airspace consolidation. Ventriculoperitoneal shunt catheter overlies the right neck, chest, and upper abdomen.  IMPRESSION: Stable moderate cardiomegaly.  Minimal interstitial pulmonary edema consistent with CHF.   Original Report Authenticated By: Evangeline Dakin, M.D.    Medications: Scheduled Meds: . furosemide  60 mg Intravenous BID  . labetalol  200 mg Oral TID  . sodium chloride  3 mL Intravenous Q12H   Continuous Infusions: . niCARDipine 10 mg/hr (05/07/13 0436)   PRN Meds:.sodium chloride, acetaminophen, acetaminophen, morphine injection, ondansetron (ZOFRAN) IV, ondansetron, oxyCODONE, sodium chloride    LOS: 1 day   Raea Magallon,CHRISTOPHER  [facility] Pager [phone]. If 8PM-8AM, please contact night-coverage at [email or web address], password [facility] 05/07/2013, 7:20 AM  LOS: 1 day

## 2013-05-07 NOTE — Consult Note (Signed)
Reason for Consult:CKD, AKI Referring Physician: Dr. Dorie Rank [name] is an 52 y.o. female.  HPI: 52 yr old female with hx HTN >30 yr, hx ICH, 4-5 yr ago, [facility] 6/13 hx Cad with MI and stent 2009.  Cr 2.2 in 12/13 , has not seen nephrology.  Has anemia. Came in with ^^BP missing meds ? Duration, and cr 3.78 which has risen to 4.07 with better bps (not at goal).  No hx UTIs or stone.  Apparently father had renal dz and died about 1 yr ago.  2 healthy sisters and 2 healty children.  Lives by herself. Very difficult historian with slurred speech, lethargy and falls asleep Constitutional: HA recent, no wgt changes, some SOB, no fevers Eyes: negative Ears, nose, mouth, throat, and face: negative Respiratory: smoke, denies cough or asthma Cardiovascular: more SOB recent Gastrointestinal: denies, N,V,D, Hep,  Genitourinary:negative Integument/breast: negative Hematologic/lymphatic: negative Musculoskeletal:negative Neurological: does not volunteer prob, has speech, and L sided weakness Endocrine: negative Allergic/Immunologic: PCNs   Past Medical History  Diagnosis Date  . HTN (hypertension)     resistant  . Hypercholesterolemia   . Tobacco abuse     resumed smoking half a pack a day. She was a smoker in Diane past and states she was abl eto stop in Diane past using nicotine patches  . CAD (coronary artery disease)     Pt has St elevation MI in Feb 2009. Left hear tcath at tha ttime showed a 70% distal LAD lesion that was hazy consistent with a plaque rupture. She had a 50% distal circumflex stenosis an a 95% distal RCA stenosis. She did havve drug eluting stent placed in her distal RCA  . Chronic kidney disease     most recent creatinine 1.4 in 3/10  . Intracranial hemorrhage, spontaneous intraparenchymal, idiopathic, remote, resolved   . Diastolic heart failure     pt most recent echo was in Feb 2009 w an EF of 60% and severe left ventricular hypertrophy. Diane pt did have evidence of  diastolic dysfunction. Diane RV appeared normal  . Subarachnoid hemorrhage     03/2012    Past Surgical History  Procedure Laterality Date  . Ventriculoperitoneal shunt  03/27/2012    Procedure: SHUNT INSERTION VENTRICULAR-PERITONEAL;  Surgeon: Winfield Cunas, MD;  Location: [place] [facility];  Service: Neurosurgery;  Laterality: N/A;  Ventricular-Peritoneal Shunt Insertion  . Cardiac catheterization    . Coronary angioplasty    . Abdominal hysterectomy      Family History  Problem Relation Age of Onset  . Coronary artery disease Other     premature in 1st degree relatives    Social History:  reports that she has been smoking Cigarettes.  She has a 7 pack-year smoking history. She has never used smokeless tobacco. She reports that she does not drink alcohol or use illicit drugs.  Allergies:  Allergies  Allergen Reactions  . Ampicillin Other (See Comments)    unknown  . Penicillins Other (See Comments)    unknown    Medications:  I have reviewed [name]'s current medications. Prior to Admission:  Prescriptions prior to admission  Medication Sig Dispense Refill  . hydrALAZINE (APRESOLINE) 50 MG tablet Take 1 tablet (50 mg total) by mouth 3 (three) times daily.  90 tablet  3  . labetalol (NORMODYNE) 200 MG tablet Take 1 tablet (200 mg total) by mouth 3 (three) times daily.  90 tablet  3  . nitroGLYCERIN (NITROSTAT) 0.4 MG SL tablet Place 0.4  mg under Diane tongue every 5 (five) minutes as needed for chest pain.         Results for orders placed during Diane hospital encounter of 05/06/13 (from Diane past 48 hour(s))  CBC WITH DIFFERENTIAL     Status: None   Collection Time    05/06/13  2:10 PM      Result Value Range   WBC 6.0  4.0 - 10.5 K/uL   RBC 4.51  3.87 - 5.11 MIL/uL   Hemoglobin 12.5  12.0 - 15.0 g/dL   HCT 37.4  36.0 - 46.0 %   MCV 82.9  78.0 - 100.0 fL   MCH 27.7  26.0 - 34.0 pg   MCHC 33.4  30.0 - 36.0 g/dL   RDW 15.4  11.5 - 15.5 %   Platelets 220  150 - 400 K/uL    Neutrophils Relative % 74  43 - 77 %   Neutro Abs 4.4  1.7 - 7.7 K/uL   Lymphocytes Relative 20  12 - 46 %   Lymphs Abs 1.2  0.7 - 4.0 K/uL   Monocytes Relative 5  3 - 12 %   Monocytes Absolute 0.3  0.1 - 1.0 K/uL   Eosinophils Relative 1  0 - 5 %   Eosinophils Absolute 0.1  0.0 - 0.7 K/uL   Basophils Relative 1  0 - 1 %   Basophils Absolute 0.0  0.0 - 0.1 K/uL  COMPREHENSIVE METABOLIC PANEL     Status: Abnormal   Collection Time    05/06/13  2:10 PM      Result Value Range   Sodium 140  135 - 145 mEq/L   Potassium 3.4 (*) 3.5 - 5.1 mEq/L   Chloride 104  96 - 112 mEq/L   CO2 18 (*) 19 - 32 mEq/L   Glucose, Bld 90  70 - 99 mg/dL   BUN 34 (*) 6 - 23 mg/dL   Creatinine, Ser 3.78 (*) 0.50 - 1.10 mg/dL   Calcium 9.7  8.4 - 10.5 mg/dL   Total Protein 7.8  6.0 - 8.3 g/dL   Albumin 4.0  3.5 - 5.2 g/dL   AST 14  0 - 37 U/L   ALT 11  0 - 35 U/L   Alkaline Phosphatase 89  39 - 117 U/L   Total Bilirubin 0.5  0.3 - 1.2 mg/dL   GFR calc non Af Amer 13 (*) >90 mL/min   GFR calc Af Amer 15 (*) >90 mL/min   Comment:            Diane eGFR has been calculated     using Diane CKD EPI equation.     This calculation has not been     validated in all clinical     situations.     eGFR's persistently     <90 mL/min signify     possible Chronic Kidney Disease.  PROTIME-INR     Status: None   Collection Time    05/06/13  2:10 PM      Result Value Range   Prothrombin Time 14.2  11.6 - 15.2 seconds   INR 1.12  0.00 - 1.49  TROPONIN I     Status: None   Collection Time    05/06/13  2:10 PM      Result Value Range   Troponin I <0.30  <0.30 ng/mL   Comment:            Due to Diane release kinetics  of cTnI,     a negative result within Diane first hours     of Diane onset of symptoms does not rule out     myocardial infarction with certainty.     If myocardial infarction is still suspected,     repeat Diane test at appropriate intervals.  APTT     Status: None   Collection Time    05/06/13  2:10 PM       Result Value Range   aPTT 30  24 - 37 seconds  PRO B NATRIURETIC PEPTIDE     Status: Abnormal   Collection Time    05/06/13  2:10 PM      Result Value Range   Pro B Natriuretic peptide (BNP) 26098.0 (*) 0 - 125 pg/mL  TROPONIN I     Status: None   Collection Time    05/06/13  5:10 PM      Result Value Range   Troponin I <0.30  <0.30 ng/mL   Comment:            Due to Diane release kinetics of cTnI,     a negative result within Diane first hours     of Diane onset of symptoms does not rule out     myocardial infarction with certainty.     If myocardial infarction is still suspected,     repeat Diane test at appropriate intervals.  MRSA PCR SCREENING     Status: None   Collection Time    05/06/13  6:29 PM      Result Value Range   MRSA by PCR NEGATIVE  NEGATIVE   Comment:            Diane GeneXpert MRSA Assay (FDA     approved for NASAL specimens     only), is one component of a     comprehensive MRSA colonization     surveillance program. It is not     intended to diagnose MRSA     infection nor to guide or     monitor treatment for     MRSA infections.  TROPONIN I     Status: None   Collection Time    05/07/13  4:47 AM      Result Value Range   Troponin I <0.30  <0.30 ng/mL   Comment:            Due to Diane release kinetics of cTnI,     a negative result within Diane first hours     of Diane onset of symptoms does not rule out     myocardial infarction with certainty.     If myocardial infarction is still suspected,     repeat Diane test at appropriate intervals.  CBC     Status: Abnormal   Collection Time    05/07/13  5:00 AM      Result Value Range   WBC 4.6  4.0 - 10.5 K/uL   RBC 3.91  3.87 - 5.11 MIL/uL   Hemoglobin 11.0 (*) 12.0 - 15.0 g/dL   HCT 32.9 (*) 36.0 - 46.0 %   MCV 84.1  78.0 - 100.0 fL   MCH 28.1  26.0 - 34.0 pg   MCHC 33.4  30.0 - 36.0 g/dL   RDW 15.8 (*) 11.5 - 15.5 %   Platelets 208  150 - 400 K/uL  PRO B NATRIURETIC PEPTIDE     Status: Abnormal    Collection Time    05/07/13  5:00 AM      Result Value Range   Pro B Natriuretic peptide (BNP) 20527.0 (*) 0 - 125 pg/mL  COMPREHENSIVE METABOLIC PANEL     Status: Abnormal   Collection Time    05/07/13  5:00 AM      Result Value Range   Sodium 141  135 - 145 mEq/L   Potassium 3.6  3.5 - 5.1 mEq/L   Chloride 108  96 - 112 mEq/L   CO2 18 (*) 19 - 32 mEq/L   Glucose, Bld 103 (*) 70 - 99 mg/dL   BUN 34 (*) 6 - 23 mg/dL   Creatinine, Ser 4.03 (*) 0.50 - 1.10 mg/dL   Calcium 9.2  8.4 - 10.5 mg/dL   Total Protein 7.0  6.0 - 8.3 g/dL   Albumin 3.5  3.5 - 5.2 g/dL   AST 11  0 - 37 U/L   ALT 9  0 - 35 U/L   Alkaline Phosphatase 76  39 - 117 U/L   Total Bilirubin 0.3  0.3 - 1.2 mg/dL   GFR calc non Af Amer 12 (*) >90 mL/min   GFR calc Af Amer 14 (*) >90 mL/min   Comment:            Diane eGFR has been calculated     using Diane CKD EPI equation.     This calculation has not been     validated in all clinical     situations.     eGFR's persistently     <90 mL/min signify     possible Chronic Kidney Disease.  RENAL FUNCTION PANEL     Status: Abnormal   Collection Time    05/07/13  5:00 AM      Result Value Range   Sodium 141  135 - 145 mEq/L   Potassium 3.6  3.5 - 5.1 mEq/L   Chloride 108  96 - 112 mEq/L   CO2 18 (*) 19 - 32 mEq/L   Glucose, Bld 104 (*) 70 - 99 mg/dL   BUN 34 (*) 6 - 23 mg/dL   Creatinine, Ser 4.07 (*) 0.50 - 1.10 mg/dL   Calcium 9.2  8.4 - 10.5 mg/dL   Phosphorus 4.2  2.3 - 4.6 mg/dL   Albumin 3.5  3.5 - 5.2 g/dL   GFR calc non Af Amer 12 (*) >90 mL/min   GFR calc Af Amer 14 (*) >90 mL/min   Comment:            Diane eGFR has been calculated     using Diane CKD EPI equation.     This calculation has not been     validated in all clinical     situations.     eGFR's persistently     <90 mL/min signify     possible Chronic Kidney Disease.    Ct Head Wo Contrast  05/06/2013   *RADIOLOGY REPORT*  Clinical Data: Headache  CT HEAD WITHOUT CONTRAST  Technique:   Contiguous axial images were obtained from Diane base of Diane skull through Diane vertex without contrast.  Comparison: 09/25/2012  Findings: No evidence for an acute hemorrhage.  No hydrocephalus, midline shift, or abnormal extra-axial fluid collection.  No CT evidence for acute ischemia.  Aneurysm coils are seen in Diane region of Diane circle of Willis.  A right sided aneurysm clip is seen again.  Diane right frontal ventriculostomy shunt catheter is again noted with stable tip position.  Inferior left cerebellar infarct again  noted.  Postsurgical change noted in Diane right frontotemporal skull.  IMPRESSION: Stable.  No new or acute interval findings.   Original Report Authenticated By: Misty Stanley, M.D.   US Renal  05/06/2013   *RADIOLOGY REPORT*  Clinical Data: Acute renal failure superimposed on chronic kidney disease.  RENAL/URINARY TRACT ULTRASOUND COMPLETE  Comparison:  None.  Findings:  Right Kidney:  Right kidney shows diffusely increased parenchymal echogenicity.  It measures 9.8 cm in length.  No right renal mass or stone or hydronephrosis.  Left Kidney:  Left kidney is increased in echogenicity diffusely. There are to lower pole cyst seen laterally, one measuring 9 mm and Diane other measuring 14 mm in size.  Diane left kidney measures 9.9 cm in length.  Bladder:  Diane bladder is unremarkable.  IMPRESSION: Increased renal parenchymal echogenicity bilaterally is consistent with medical renal disease.  No hydronephrosis.  Two small left renal cysts.   Original Report Authenticated By: Lajean Manes, M.D.   Dg Chest Port 1 View  05/07/2013   *RADIOLOGY REPORT*  Clinical Data: CHF  PORTABLE CHEST - 1 VIEW  Comparison: Prior radiograph from 05/06/2013  Findings:  VP shunt catheter tubing is again seen coursing over Diane right anterior chest.  Cardiomegaly is not significantly changed as compared to Diane prior examination.  Diane lungs are mildly hypoinflated.  Diffuse prominence of Diane interstitial markings with a few  scattered Kerley lines are not significantly changed as compared to Diane prior study. There is question of a left pleural effusion.  No pneumothorax.  No soft tissues and osseous structures are unchanged.  IMPRESSION: Stable cardiomegaly and mild interstitial pulmonary edema, consistent with CHF.   Original Report Authenticated By: Jeannine Boga, M.D.   Dg Chest Port 1 View  05/06/2013   *RADIOLOGY REPORT*  Clinical Data: Shortness of breath.  Current history of hypertension.  Current smoker.  PORTABLE CHEST - 1 VIEW 05/06/2013 1419 hours:  Comparison: Portable chest x-ray 04/30/2012 dating back to 03/05/2012.  Findings: Cardiac silhouette moderately enlarged but stable. Pulmonary venous hypertension and scattered Kerley B lines consistent with minimal interstitial pulmonary edema.  No visible pleural effusions.  No confluent airspace consolidation. Ventriculoperitoneal shunt catheter overlies Diane right neck, chest, and upper abdomen.  IMPRESSION: Stable moderate cardiomegaly.  Minimal interstitial pulmonary edema consistent with CHF.   Original Report Authenticated By: Evangeline Dakin, M.D.    @ROS @ Blood pressure 157/83, pulse 65, temperature 97.8 F (36.6 C), temperature source Oral, resp. rate 14, height 5\' 1"  (1.549 m), weight 64.7 kg (142 lb 10.2 oz), SpO2 98.00%. @PHYSEXAMBYAGE2 @ Physical Examination: General appearance - uncooperative and slurred and unclear speech, low LOC Mental status - drowsy, uncooperative Eyes - pupils equal and reactive, extraocular eye movements intact Mouth - mucous membranes moist, pharynx normal without lesions Neck - adenopathy noted PCL Shunt on R Lymphatics - posterior cervical nodes Chest - diminsihed bs, occ rhonchi Heart - S1 and S2 normal, S4 present, systolic murmur [id number] at apex LV lift, PMI displaced Abdomen - liver down 4 cm pos bs, soft Neurological - L 7th, L leg weaker than R Musculoskeletal - no joint tenderness, deformity or  swelling Extremities - pedal edema 1+  Skin - dry  Assessment/Plan: 1 CKD 4-5 will worsen with bp control and suspect is 5 . Close to HD, Small shrunken kidneys,.  Will eval for reversibility , control complic, and educate.  This is late in Diane course 2 ICH/SAH signif deficits 3 Hypertension: not controlled , vol xs use Lasix  po 4. Anemia of CKD eval and tx 5. Metabolic Bone Disease:  eval PTH 6 Social situation needs support] P Fe labs, PTH, ed, control bp slowly, save arm, social eval  Lander Eslick L 05/07/2013, 12:34 PM

## 2013-05-07 NOTE — Progress Notes (Signed)
Subjective:  Says she feels better, but is lethargic. No c/o chest pain or SOB  Objective:  Vital Signs in the last 24 hours: BP 130/65  Pulse 66  Temp(Src) 97.6 F (36.4 C) (Oral)  Resp 15  Ht 5\' 1"  (1.549 m)  Wt 64.7 kg (142 lb 10.2 oz)  BMI 26.97 kg/m2  SpO2 94%  Physical Exam: Small framed BF lethargic, in NAD Lungs:  Clear  Cardiac:  Regular rhythm, normal S1 and S2, no S3, S Abdomen:  Soft, nontender, no masses Extremities:  No edema present  Intake/Output from previous day: 07/18 0701 - 07/19 0700 In: 810 [I.V.:810] Out: 700 [Urine:700]  Weight Filed Weights   05/06/13 1815  Weight: 64.7 kg (142 lb 10.2 oz)    Lab Results: Basic Metabolic Panel:  Recent Labs  05/06/13 1410 05/07/13 0500  NA 140 141  141  K 3.4* 3.6  3.6  CL 104 108  108  CO2 18* 18*  18*  GLUCOSE 90 103*  104*  BUN 34* 34*  34*  CREATININE 3.78* 4.03*  4.07*   CBC:  Recent Labs  05/06/13 1410 05/07/13 0500  WBC 6.0 4.6  NEUTROABS 4.4  --   HGB 12.5 11.0*  HCT 37.4 32.9*  MCV 82.9 84.1  PLT 220 208   Cardiac Enzymes:  Recent Labs  05/06/13 1410 05/06/13 1710 05/07/13 0447  TROPONINI <0.30 <0.30 <0.30    Telemetry: Sinus bradycardia  Assessment/Plan:  1. Malignant hypertension better 2. Acute on chronic renal failure 3. Non compliance 4. History of CAD 5. History of SAH  Rec:  BP is better controlled.  Need to restart home meds and watc hrenal function as it may worsen. Check ECHO.    Kerry Hough  MD [facility]  05/07/2013, 8:07 AM

## 2013-05-07 NOTE — Progress Notes (Signed)
Echo Lab  2D Echocardiogram completed.  Hopkins, RDCS 05/07/2013 8:26 AM

## 2013-05-07 NOTE — Progress Notes (Signed)
Patient transferring to room 1434.  Report called to Vanita Ingles, Therapist, sports.  Patient to travel by wheelchair.  Will continue to monitor.

## 2013-05-07 NOTE — Consult Note (Signed)
PULMONARY  / CRITICAL CARE MEDICINE  Name: [name] MRN: [id number] DOB: 1961/03/01    ADMISSION DATE:  05/06/2013 CONSULTATION DATE:  05/07/2013  REFERRING MD :  Dr. Darrick Meigs PRIMARY SERVICE: Hospitalist  CHIEF COMPLAINT:  headache  BRIEF PATIENT DESCRIPTION: 52y/o with known severe HTN ran out of her meds and developed malignant hypertension with headache, blurring of vision and AKI on top of CKD.  SIGNIFICANT EVENTS / STUDIES:  Cardene drip and labetalol IV started CT head unremarkable. CXR mild interstitial edema. Renal US medical disease.  LINES / TUBES: peripherals  CULTURES: None  ANTIBIOTICS: None  HISTORY OF PRESENT ILLNESS:  This is a 52 year old female, with known history of severe HTN, s/p Spontaneous intracerebral hemorrhage, s/p SAH 03/2002, required V-P Shunt, CAD, s/p NSTEMI 11/2007, s/p DES to RCA, CKD-4 (baseline creatinine 2.21-2.56 in 09/2012). She had been compliant with her medication, but ran out about 3 days a go, and was unable to get refill, for insurance reasons. On 05/04/13, she developed a frontal headache, which has persisted, unassociated with visual obscurations. Since 05/05/13, she has had recurrent vomiting, up to 4-5 times daily, including today. She has bseline dyspnea that has not changed. She denies chest pain, cough, fever or chills. In the ED, BP was 298/160-230/102. Head CT scan had no acute findings, and CXR revealed stable moderate cardiomegaly. Minimal interstitial pulmonary edema consistent with CHF. ProB[place] was [place] at [address]. Has AKI on top of CKD likely due to malignant HTN   PAST MEDICAL HISTORY :  Past Medical History  Diagnosis Date  . HTN (hypertension)     resistant  . Hypercholesterolemia   . Tobacco abuse     resumed smoking half a pack a day. She was a smoker in the past and states she was abl eto stop in the past using nicotine patches  . CAD (coronary artery disease)     Pt has St elevation MI in Feb 2009. Left  hear tcath at tha ttime showed a 70% distal LAD lesion that was hazy consistent with a plaque rupture. She had a 50% distal circumflex stenosis an a 95% distal RCA stenosis. She did havve drug eluting stent placed in her distal RCA  . Chronic kidney disease     most recent creatinine 1.4 in 3/10  . Intracranial hemorrhage, spontaneous intraparenchymal, idiopathic, remote, resolved   . Diastolic heart failure     pt most recent echo was in Feb 2009 w an EF of 60% and severe left ventricular hypertrophy. The pt did have evidence of diastolic dysfunction. The RV appeared normal  . Subarachnoid hemorrhage     03/2012   Past Surgical History  Procedure Laterality Date  . Ventriculoperitoneal shunt  03/27/2012    Procedure: SHUNT INSERTION VENTRICULAR-PERITONEAL;  Surgeon: Winfield Cunas, MD;  Location: [place] [facility];  Service: Neurosurgery;  Laterality: N/A;  Ventricular-Peritoneal Shunt Insertion  . Cardiac catheterization    . Coronary angioplasty    . Abdominal hysterectomy     Prior to Admission medications   Medication Sig Start Date End Date Taking? Authorizing Provider  hydrALAZINE (APRESOLINE) 50 MG tablet Take 1 tablet (50 mg total) by mouth 3 (three) times daily. 09/28/12  Yes Ripudeep Krystal Eaton, MD  labetalol (NORMODYNE) 200 MG tablet Take 1 tablet (200 mg total) by mouth 3 (three) times daily. 09/28/12  Yes Ripudeep Krystal Eaton, MD  nitroGLYCERIN (NITROSTAT) 0.4 MG SL tablet Place 0.4 mg under the tongue every 5 (five) minutes as  needed for chest pain.  04/27/12  Yes Sorin June Leap, MD   Allergies  Allergen Reactions  . Ampicillin Other (See Comments)    unknown  . Penicillins Other (See Comments)    unknown    FAMILY HISTORY:  Family History  Problem Relation Age of Onset  . Coronary artery disease Other     premature in 1st degree relatives   SOCIAL HISTORY:  reports that she has been smoking Cigarettes.  She has a 7 pack-year smoking history. She has never used smokeless tobacco. She  reports that she does not drink alcohol or use illicit drugs.  REVIEW OF SYSTEMS:  No rash, no focal weakness, no numbness, no abd pain, no dysuria, no diarrhea, no hemoptysis, no fever, no palpitations.  SUBJECTIVE: currently chest pain has resolved, no more nausea, currently comfortable  VITAL SIGNS: Temp:  [97.5 F (36.4 C)-98.3 F (36.8 C)] 97.7 F (36.5 C) (07/19 0000) Pulse Rate:  [65-82] 68 (07/19 0100) Resp:  [14-24] 17 (07/19 0100) BP: (138-298)/(65-160) 144/77 mmHg (07/19 0100) SpO2:  [94 %-100 %] 94 % (07/19 0100) Weight:  [64.7 kg (142 lb 10.2 oz)] 64.7 kg (142 lb 10.2 oz) (07/18 1815) HEMODYNAMICS:   VENTILATOR SETTINGS:   INTAKE / OUTPUT: Intake/Output     07/18 0701 - 07/19 0700   I.V. (mL/kg) 410 (6.3)   Total Intake(mL/kg) 410 (6.3)   Urine (mL/kg/hr) 300   Total Output 300   Net +110         PHYSICAL EXAMINATION: General:  Comfortable at rest Neuro:  No focal weakness, no facial droop, no focal numbness, tongue midline, EOM full and equal, no blurring of vision HEENT:  Atraumatic, trachea midline, no LN Cardiovascular:  RRR, has grade 3/6 systolic murmur Lungs:  Clear, no wheeze Abdomen:  Soft, no guarding, no tenderness Musculoskeletal:  Well developed, no edema Skin:  No rash  LABS:  Recent Labs Lab 05/06/13 1410 05/06/13 1710  HGB 12.5  --   WBC 6.0  --   PLT 220  --   NA 140  --   K 3.4*  --   CL 104  --   CO2 18*  --   GLUCOSE 90  --   BUN 34*  --   CREATININE 3.78*  --   CALCIUM 9.7  --   AST 14  --   ALT 11  --   ALKPHOS 89  --   BILITOT 0.5  --   PROT 7.8  --   ALBUMIN 4.0  --   APTT 30  --   INR 1.12  --   TROPONINI <0.30 <0.30  PROBNP 26098.0*  --    No results found for this basename: GLUCAP,  in the last 168 hours  CXR:  7/18: Stable moderate cardiomegaly. Minimal interstitial pulmonary edema consistent with CHF   ASSESSMENT / PLAN:  PULMONARY A: mild pulmonary edema P:   Possibly from malignant  hypertension. No acute intervention for now as patient is comfortable. Continue lasix  CARDIOVASCULAR A: malignant HTN with end organ damage P:  Continue BP meds and titrate to BP < 160/100 in the acute setting and bring it down further in the next few days  RENAL A:  AKI on CKD. Likely end organ damage from HTN P:   Should improve with better BP management. Monitor for now  GASTROINTESTINAL A:  Asymptomatic P:   No acute intervention. Start diet. No need GI prophylaxis if patient is eating  HEMATOLOGIC A:  No acute problems P:  Monitor Hb  INFECTIOUS A:  no signs of infection P:   No acute intervention  ENDOCRINE A: blood sugar control P:  ' Goal 140-180  NEUROLOGIC A:  Headache resolved P:   BP management  TODAY'S SUMMARY: developed symptomatic malignant hypertension, controlled with labetalol IV, nicardipine gtt, lasix  I have personally obtained a history, examined the patient, evaluated laboratory and imaging results, formulated the assessment and plan and placed orders. CRITICAL CARE: The patient is critically ill with multiple organ systems failure and requires high complexity decision making for assessment and support, frequent evaluation and titration of therapies, application of advanced monitoring technologies and extensive interpretation of multiple databases. Critical Care Time devoted to patient care services described in this note is 35 minutes.    Pulmonary and [place] Pager: [phone]  05/07/2013, 2:17 AM

## 2013-05-07 NOTE — Progress Notes (Signed)
PCCM  BP much better on nicardipene drip. Plan to transition to PO meds and tfr to tele.  Agree with renal consult.  PCCM will be available prn and will now SIGN OFF.   Mariel Sleet Beeper  [phone]  Cell  [phone]  If no response or cell goes to voicemail, call beeper [phone]

## 2013-05-08 DIAGNOSIS — I10 Essential (primary) hypertension: Secondary | ICD-10-CM

## 2013-05-08 LAB — CBC
HCT: 36.1 % (ref 36.0–46.0)
MCHC: 31.9 g/dL (ref 30.0–36.0)
MCV: 84.1 fL (ref 78.0–100.0)
Platelets: 225 10*3/uL (ref 150–400)
RDW: 15.9 % — ABNORMAL HIGH (ref 11.5–15.5)
WBC: 7.1 10*3/uL (ref 4.0–10.5)

## 2013-05-08 LAB — COMPREHENSIVE METABOLIC PANEL
Albumin: 3.8 g/dL (ref 3.5–5.2)
Alkaline Phosphatase: 83 U/L (ref 39–117)
BUN: 35 mg/dL — ABNORMAL HIGH (ref 6–23)
Calcium: 9.8 mg/dL (ref 8.4–10.5)
Creatinine, Ser: 4.59 mg/dL — ABNORMAL HIGH (ref 0.50–1.10)
GFR calc Af Amer: 12 mL/min — ABNORMAL LOW (ref >90)
Glucose, Bld: 87 mg/dL (ref 70–99)
Potassium: 3.5 mEq/L (ref 3.5–5.1)
Total Protein: 7.2 g/dL (ref 6.0–8.3)

## 2013-05-08 LAB — MAGNESIUM: Magnesium: 1.9 mg/dL (ref 1.5–2.5)

## 2013-05-08 MED ORDER — METRONIDAZOLE 500 MG PO TABS
2000.0000 mg | ORAL_TABLET | Freq: Once | ORAL | Status: AC
  Administered 2013-05-08: 2000 mg via ORAL
  Filled 2013-05-08: qty 4

## 2013-05-08 MED ORDER — AZITHROMYCIN 500 MG PO TABS
1000.0000 mg | ORAL_TABLET | Freq: Once | ORAL | Status: AC
  Administered 2013-05-08: 1000 mg via ORAL
  Filled 2013-05-08: qty 2

## 2013-05-08 MED ORDER — LABETALOL HCL 200 MG PO TABS
400.0000 mg | ORAL_TABLET | Freq: Two times a day (BID) | ORAL | Status: DC
  Administered 2013-05-08 – 2013-05-10 (×4): 400 mg via ORAL
  Filled 2013-05-08 (×5): qty 2

## 2013-05-08 MED ORDER — LABETALOL HCL 200 MG PO TABS
200.0000 mg | ORAL_TABLET | Freq: Once | ORAL | Status: AC
  Administered 2013-05-08: 200 mg via ORAL
  Filled 2013-05-08: qty 1

## 2013-05-08 MED ORDER — FERUMOXYTOL INJECTION 510 MG/17 ML
510.0000 mg | Freq: Once | INTRAVENOUS | Status: AC
  Administered 2013-05-08: 510 mg via INTRAVENOUS
  Filled 2013-05-08 (×2): qty 17

## 2013-05-08 MED ORDER — AMLODIPINE BESYLATE 10 MG PO TABS
10.0000 mg | ORAL_TABLET | Freq: Every day | ORAL | Status: DC
  Administered 2013-05-08 – 2013-05-14 (×7): 10 mg via ORAL
  Filled 2013-05-08 (×7): qty 1

## 2013-05-08 NOTE — Progress Notes (Addendum)
Subjective:  Complains of shortness of breath today. Very limited insight into her whole medical condition.  Objective:  Vital Signs in the last 24 hours: BP 196/94  Pulse 67  Temp(Src) 97.6 F (36.4 C) (Oral)  Resp 20  Ht 5\' 1"  (1.549 m)  Wt 64.7 kg (142 lb 10.2 oz)  BMI 26.97 kg/m2  SpO2 97%  Physical Exam: Small framed BF lethargic, in NAD Lungs:  Clear  Cardiac:  Regular rhythm, normal S1 and S2, no S3, S4 present 1-2/6 murmur Abdomen:  Soft, nontender, no masses Extremities:  No edema present  Intake/Output from previous day: 07/19 0701 - 07/20 0700 In: 440 [P.O.:400; I.V.:40] Out: 2175 [Urine:2175]  Weight Filed Weights   05/06/13 1815  Weight: 64.7 kg (142 lb 10.2 oz)    Lab Results: Basic Metabolic Panel:  Recent Labs  05/07/13 0500 05/08/13 0458  NA 141  141 141  K 3.6  3.6 3.5  CL 108  108 105  CO2 18*  18* 21  GLUCOSE 103*  104* 87  BUN 34*  34* 35*  CREATININE 4.03*  4.07* 4.59*   CBC:  Recent Labs  05/06/13 1410 05/07/13 0500 05/08/13 0458  WBC 6.0 4.6 7.1  NEUTROABS 4.4  --   --   HGB 12.5 11.0* 11.5*  HCT 37.4 32.9* 36.1  MCV 82.9 84.1 84.1  PLT 220 208 225   Cardiac Enzymes:  Recent Labs  05/06/13 1710 05/07/13 0006 05/07/13 0447  TROPONINI <0.30 <0.30 <0.30    Telemetry: Sinus bradycardia  Assessment/Plan:  1. Malignant hypertension better 2. Acute on chronic renal failure 3. Non compliance 4. History of CAD 5. History of SAH  Rec:  Blood pressure is elevated again. She'll need to have her blood pressure medicines titrated. Renal consultation was noted and agree the renal function will likely worsen as her blood pressure comes down   W. Doristine Church  MD [facility]  05/08/2013, 8:41 AM

## 2013-05-08 NOTE — Progress Notes (Signed)
Attempted to call daughter to inform her room number at [facility], but no one answered. Will attempt to call again. Setzer, Marchelle Folks

## 2013-05-08 NOTE — Progress Notes (Signed)
Subjective: Interval History: has complaints concern of bp.  Objective: Vital signs in last 24 hours: Temp:  [97.4 F (36.3 C)-98 F (36.7 C)] 97.6 F (36.4 C) (07/20 0659) Pulse Rate:  [62-73] 73 (07/20 1125) Resp:  [18-20] 20 (07/20 0659) BP: (159-199)/(74-98) 192/91 mmHg (07/20 1125) SpO2:  [97 %-99 %] 97 % (07/20 0659) Weight change:   Intake/Output from previous day: 07/19 0701 - 07/20 0700 In: 440 [P.O.:400; I.V.:40] Out: 2175 [Urine:2175] Intake/Output this shift: Total I/O In: 360 [P.O.:360] Out: 450 [Urine:450]  General appearance: cooperative and slowed mentation Resp: diminished breath sounds bilaterally Cardio: S1, S2 normal and systolic murmur: holosystolic 2/6, blowing at apex GI: pos bs,liver down 4 cm, soft Extremities: edema 1+  Lab Results:  Recent Labs  05/07/13 0500 05/08/13 0458  WBC 4.6 7.1  HGB 11.0* 11.5*  HCT 32.9* 36.1  PLT 208 225   BMET:  Recent Labs  05/07/13 0500 05/08/13 0458  NA 141  141 141  K 3.6  3.6 3.5  CL 108  108 105  CO2 18*  18* 21  GLUCOSE 103*  104* 87  BUN 34*  34* 35*  CREATININE 4.03*  4.07* 4.59*  CALCIUM 9.2  9.2 9.8   No results found for this basename: PTH,  in the last 72 hours Iron Studies:  Recent Labs  05/07/13 1339  IRON 51  TIBC 228*    Studies/Results: Ct Head Wo Contrast  05/06/2013   *RADIOLOGY REPORT*  Clinical Data: Headache  CT HEAD WITHOUT CONTRAST  Technique:  Contiguous axial images were obtained from the base of the skull through the vertex without contrast.  Comparison: 09/25/2012  Findings: No evidence for an acute hemorrhage.  No hydrocephalus, midline shift, or abnormal extra-axial fluid collection.  No CT evidence for acute ischemia.  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  A right sided aneurysm clip is seen again.  The right frontal ventriculostomy shunt catheter is again noted with stable tip position.  Inferior left cerebellar infarct again  noted.  Postsurgical change noted in the right frontotemporal skull.  IMPRESSION: Stable.  No new or acute interval findings.   Original Report Authenticated By: Misty Stanley, M.D.   US Renal  05/06/2013   *RADIOLOGY REPORT*  Clinical Data: Acute renal failure superimposed on chronic kidney disease.  RENAL/URINARY TRACT ULTRASOUND COMPLETE  Comparison:  None.  Findings:  Right Kidney:  Right kidney shows diffusely increased parenchymal echogenicity.  It measures 9.8 cm in length.  No right renal mass or stone or hydronephrosis.  Left Kidney:  Left kidney is increased in echogenicity diffusely. There are to lower pole cyst seen laterally, one measuring 9 mm and the other measuring 14 mm in size.  The left kidney measures 9.9 cm in length.  Bladder:  The bladder is unremarkable.  IMPRESSION: Increased renal parenchymal echogenicity bilaterally is consistent with medical renal disease.  No hydronephrosis.  Two small left renal cysts.   Original Report Authenticated By: Lajean Manes, M.D.   Dg Chest Port 1 View  05/07/2013   *RADIOLOGY REPORT*  Clinical Data: CHF  PORTABLE CHEST - 1 VIEW  Comparison: Prior radiograph from 05/06/2013  Findings:  VP shunt catheter tubing is again seen coursing over the right anterior chest.  Cardiomegaly is not significantly changed as compared to the prior examination.  The lungs are mildly hypoinflated.  Diffuse prominence of the interstitial markings with a few scattered Kerley lines are not significantly changed as compared  to the prior study. There is question of a left pleural effusion.  No pneumothorax.  No soft tissues and osseous structures are unchanged.  IMPRESSION: Stable cardiomegaly and mild interstitial pulmonary edema, consistent with CHF.   Original Report Authenticated By: Jeannine Boga, M.D.   Dg Chest Port 1 View  05/06/2013   *RADIOLOGY REPORT*  Clinical Data: Shortness of breath.  Current history of hypertension.  Current smoker.  PORTABLE CHEST - 1  VIEW 05/06/2013 1419 hours:  Comparison: Portable chest x-ray 04/30/2012 dating back to 03/05/2012.  Findings: Cardiac silhouette moderately enlarged but stable. Pulmonary venous hypertension and scattered Kerley B lines consistent with minimal interstitial pulmonary edema.  No visible pleural effusions.  No confluent airspace consolidation. Ventriculoperitoneal shunt catheter overlies the right neck, chest, and upper abdomen.  IMPRESSION: Stable moderate cardiomegaly.  Minimal interstitial pulmonary edema consistent with CHF.   Original Report Authenticated By: Evangeline Dakin, M.D.    I have reviewed the patient's current medications.  Assessment/Plan: 1 CKD5 with AKI related to [facility] HTN Cr rising as expected.  May need HD soon, discussed. 2 Anemia fe low give iv 3 HTn ^ , meds, diuresis 4 CAD 5 CVAs with ICH, SAH, shunt 6 smoking P to [facility], ^ bp meds, w/u    LOS: 2 days   [name] L 05/08/2013,11:29 AM

## 2013-05-08 NOTE — Significant Event (Signed)
Pt arrived via [facility]link from [facility] hospital . Pt alert and Ox4 Vs 167/89, hr 73,r 18, 97 % RA.Marland Kitchen Denies pain at this time. Pt oriented to room and hospital polices.No c/o noted at this time. Call bell in reach.

## 2013-05-08 NOTE — Significant Event (Addendum)
Informed by [facility] bed available # [phone] to transfer Pt as per original ordered bed request when pt was transferred from [facility]. hospital. Bed management called by [facility] charge nurse to confirm bed assignment and to ask why [facility] was not notified of bed assignment. [facility] called requested to give report to [facility] nurse receiving pt before shift change. Receiving nurse on [facility]not ready to receive report at this time and was told [facility] nurse  will call when ready for report.

## 2013-05-08 NOTE — Progress Notes (Signed)
[facility] PROGRESS NOTE  [name] [id number] DOB: [birth year] DOA: 05/06/2013 PCP: No primary provider on file.  Assessment/Plan: Active Problems:   HTN (hypertension), malignant   CHF (congestive heart failure)   CAD (coronary artery disease)   CKD (chronic kidney disease), stage IV   Hypertensive heart disease   Acute-on-chronic renal failure    1. HTN (hypertension), malignant: Patient presented with severe uncontrolled HTN (298/160-230/102), associated with frontal headache and vomiting. Clinically, this is consistent with accelerated malignant HTN, in the setting of medication non-compliance. Head CT scan was devoid of acute findings. Patient was administered iv Labetalol in the ED, then commenced on ivi Nicardipine, titrated for BP control. IN AM of 05/07/13, BP had normalized at 130/65, Nicardipine infusion was discontinued, and patient transitioned to pre-admission antihypertensives. Adjusting as indicated, but BP is once again markedly elevated at 197/93 this AM. Have increased labetalol to 400 mg BID and added Norvasc 10 mg daily.  2. CHF (congestive heart failure): Patient has had intermittent SOB for several months. CXR confirmed moderate cardiomegaly and minimal interstitial pulmonary edema consistent with CHF. [place] [address]. Managed initially with iv Lasix. Cardiac enzymes are unelevated and patient has no chest pain. 2D Echocardiogram is pending, and ProBNP is trending down. Repeat PCXR on 05/07/13 is unchanged. Dr Acie Fredrickson provided cardiology consultation. Input is much appreciated.  3. CAD (coronary artery disease): Patient has known CAD, s/p NSTEMI 2009, s/p DES. 12-Lead EKG is devoid of acute ischemic changes. No clinical evidence of ACS at this time.  4. CKD (chronic kidney disease), stage IV: Baseline creatinine is 2.21-2.56 in 09/2012. At presentation, creatinine was 3.78, consistent with progression of renal failure. Renal U/S revealed  increased renal parenchymal echogenicity bilaterally, consistent with medical renal disease. No hydronephrosis. Two small left renal cysts. Creatinine is climbing steadily, and creatinine is 4.59 today. Dr Jeneen Rinks Deterding provided nephrology consultation, and the feeling, is that patient may be close to HD.  5. Tobacco Abuse: Patient admittedly, smokes a pack of cigarettes per day. Counseled appropriately, and commenced on Nicoderm CQ patch.    Code Status: Full Code. Family Communication:  Disposition Plan: To be determined. Per my discussion with Dr Deterding today, will transfer to [facility] [place] floor. .    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 According to patient she had been compliant with her medication, but ran out about 3 days a go, and was unable to get refill, for insurance reasons. On 05/04/13, she developed a frontal headache, which has persisted, unassociated with visual obscurations. Since 05/05/13, she has had recurrent vomiting, up to 4-5 times daily, including today. Although she is intermittently SOB, this has been going on for about 6 months, and is no worse at this time. She denies chest pain, cough, fever or chills. In the ED, BP was 298/160-230/102. Head CT scan was devoid of acute findings, and CXR revealed stable moderate cardiomegaly. Minimal interstitial pulmonary edema consistent with CHF. ProBNP was markedly elevated at [address].    Consultants:  Dr Acie Fredrickson, Cardiologist.  Dr Alric Quan, PCCM.   Procedures:  CXR.  Head CT scan.  Antibiotics:  N/A.   HPI/Subjective: Somewhat confused.  Objective: Vital signs in last 24 hours: Temp:  [97.4 F (36.3 C)-98 F (36.7 C)] 97.6 F (36.4 C) (07/20 0659) Pulse Rate:  [  62-68] 68 (07/20 0931) Resp:  [14-20] 20  (07/20 0659) BP: (157-199)/(74-98) 197/93 mmHg (07/20 0931) SpO2:  [97 %-99 %] 97 % (07/20 0659) Weight change:  Last BM Date: 05/05/13  Intake/Output from previous day: 07/19 0701 - 07/20 0700 In: 440 [P.O.:400; I.V.:40] Out: 2175 [Urine:2175] Total I/O In: 360 [P.O.:360] Out: 300 [Urine:300]   Physical Exam: General: Alert, communicative, fully oriented, talking in complete sentences, not short of breath at rest.  HEENT: No clinical pallor, no jaundice, no conjunctival injection or discharge.  NECK: Supple, JVP not seen, no carotid bruits, no palpable lymphadenopathy, no palpable goiter.  CHEST: Clinically clear to auscultation, no wheezes, no crackles.  HEART: Sounds 1 and 2 heard, normal, regular, no murmurs.  ABDOMEN: Full, soft, non-tender, no palpable organomegaly, no palpable masses, normal bowel sounds.  GENITALIA: Not examined.  LOWER EXTREMITIES: No pitting edema, palpable peripheral pulses.  MUSCULOSKELETAL SYSTEM: Unremarkable.  CENTRAL NERVOUS SYSTEM: No focal neurologic deficit on gross examination.  Lab Results:  Recent Labs  05/07/13 0500 05/08/13 0458  WBC 4.6 7.1  HGB 11.0* 11.5*  HCT 32.9* 36.1  PLT 208 225    Recent Labs  05/07/13 0500 05/08/13 0458  NA 141  141 141  K 3.6  3.6 3.5  CL 108  108 105  CO2 18*  18* 21  GLUCOSE 103*  104* 87  BUN 34*  34* 35*  CREATININE 4.03*  4.07* 4.59*  CALCIUM 9.2  9.2 9.8   Recent Results (from the past 240 hour(s))  MRSA PCR SCREENING     Status: None   Collection Time    05/06/13  6:29 PM      Result Value Range Status   MRSA by PCR NEGATIVE  NEGATIVE Final   Comment:            The GeneXpert MRSA Assay (FDA     approved for NASAL specimens     only), is one component of a     comprehensive MRSA colonization     surveillance program. It is not     intended to diagnose MRSA     infection nor to guide or     monitor treatment for     MRSA infections.     Studies/Results: Ct Head Wo  Contrast  05/06/2013   *RADIOLOGY REPORT*  Clinical Data: Headache  CT HEAD WITHOUT CONTRAST  Technique:  Contiguous axial images were obtained from the base of the skull through the vertex without contrast.  Comparison: 09/25/2012  Findings: No evidence for an acute hemorrhage.  No hydrocephalus, midline shift, or abnormal extra-axial fluid collection.  No CT evidence for acute ischemia.  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  A right sided aneurysm clip is seen again.  The right frontal ventriculostomy shunt catheter is again noted with stable tip position.  Inferior left cerebellar infarct again noted.  Postsurgical change noted in the right frontotemporal skull.  IMPRESSION: Stable.  No new or acute interval findings.   Original Report Authenticated By: Misty Stanley, M.D.   US Renal  05/06/2013   *RADIOLOGY REPORT*  Clinical Data: Acute renal failure superimposed on chronic kidney disease.  RENAL/URINARY TRACT ULTRASOUND COMPLETE  Comparison:  None.  Findings:  Right Kidney:  Right kidney shows diffusely increased parenchymal echogenicity.  It measures 9.8 cm in length.  No right renal mass or stone or hydronephrosis.  Left Kidney:  Left kidney is increased in echogenicity diffusely. There are to lower  pole cyst seen laterally, one measuring 9 mm and the other measuring 14 mm in size.  The left kidney measures 9.9 cm in length.  Bladder:  The bladder is unremarkable.  IMPRESSION: Increased renal parenchymal echogenicity bilaterally is consistent with medical renal disease.  No hydronephrosis.  Two small left renal cysts.   Original Report Authenticated By: Lajean Manes, M.D.   Dg Chest Port 1 View  05/07/2013   *RADIOLOGY REPORT*  Clinical Data: CHF  PORTABLE CHEST - 1 VIEW  Comparison: Prior radiograph from 05/06/2013  Findings:  VP shunt catheter tubing is again seen coursing over the right anterior chest.  Cardiomegaly is not significantly changed as compared to the prior  examination.  The lungs are mildly hypoinflated.  Diffuse prominence of the interstitial markings with a few scattered Kerley lines are not significantly changed as compared to the prior study. There is question of a left pleural effusion.  No pneumothorax.  No soft tissues and osseous structures are unchanged.  IMPRESSION: Stable cardiomegaly and mild interstitial pulmonary edema, consistent with CHF.   Original Report Authenticated By: Jeannine Boga, M.D.   Dg Chest Port 1 View  05/06/2013   *RADIOLOGY REPORT*  Clinical Data: Shortness of breath.  Current history of hypertension.  Current smoker.  PORTABLE CHEST - 1 VIEW 05/06/2013 1419 hours:  Comparison: Portable chest x-ray 04/30/2012 dating back to 03/05/2012.  Findings: Cardiac silhouette moderately enlarged but stable. Pulmonary venous hypertension and scattered Kerley B lines consistent with minimal interstitial pulmonary edema.  No visible pleural effusions.  No confluent airspace consolidation. Ventriculoperitoneal shunt catheter overlies the right neck, chest, and upper abdomen.  IMPRESSION: Stable moderate cardiomegaly.  Minimal interstitial pulmonary edema consistent with CHF.   Original Report Authenticated By: Evangeline Dakin, M.D.    Medications: Scheduled Meds: . furosemide  80 mg Oral BID  . hydrALAZINE  50 mg Oral TID  . labetalol  200 mg Oral Once  . labetalol  400 mg Oral BID  . metroNIDAZOLE  1,000 mg Oral Once  . nicotine  21 mg Transdermal Daily  . sodium chloride  3 mL Intravenous Q12H   Continuous Infusions:   PRN Meds:.sodium chloride, acetaminophen, acetaminophen, morphine injection, nitroGLYCERIN, ondansetron (ZOFRAN) IV, ondansetron, oxyCODONE, sodium chloride    LOS: 2 days   [name],CHRISTOPHER  [facility] Pager [phone]. If 8PM-8AM, please contact night-coverage at [email or web address], password [email or web address] 05/08/2013, 10:26 AM  LOS: 2 days

## 2013-05-08 NOTE — Significant Event (Signed)
Transfer canceled to [facility] pt will remain on [facility] at this time per MD

## 2013-05-08 NOTE — Care Management Note (Signed)
    Page 1 of 1   05/08/2013     12:58:05 PM   CARE MANAGEMENT NOTE 05/08/2013  Patient:  [name], [name]   Account Number:  [id number]  Date Initiated:  05/07/2013  Documentation initiated by:  [facility]  Subjective/Objective Assessment:   52 year old female admitted iwth malignant hypertension.     Action/Plan:   From home   Anticipated DC Date:  05/08/2013   Anticipated DC Plan:  ACUTE TO ACUTE TRANS      DC Planning Services  CM consult      Choice offered to / List presented to:             Status of service:  Completed, signed off Medicare Important Message given?  NA - LOS <3 / Initial given by admissions (If response is "NO", the following Medicare IM given date fields will be blank) Date Medicare IM given:   Date Additional Medicare IM given:    Discharge Disposition:  ACUTE TO ACUTE TRANS  Per UR Regulation:  Reviewed for med. necessity/level of care/duration of stay  If discussed at [place] of Stay Meetings, dates discussed:    Comments:  05/08/13 Myana Schlup RN,BSN NCM [place].MALIGNANT HTN.TRANSFER TO [place] D/C PLANS HOME.

## 2013-05-08 NOTE — Progress Notes (Signed)
[facility] PROGRESS NOTE  [name] [id number] DOB: 20-Sep-1961 DOA: 05/06/2013 PCP: No primary provider on file.  Assessment/Plan: 1. HTN (hypertension), malignant: Patient's hypertension improved from admission  (298/160-230/102), but still elevated.  Continue labetalol 400 mg BID and Norvasc  10 mg daily. Will add a clonidine patch 0.1 mg .  2. CHF (congestive heart failure): Patient states had a dry weight of 165 pounds start gently hydrating patient secondary to patient's increasing kidney failure s 3. CAD (coronary artery disease): Patient has known CAD, s/p NSTEMI 2009, s/p DES. 12-Lead EKG is devoid of acute ischemic changes. No clinical evidence of ACS at this time.   4. CKD (chronic kidney disease), stage IV: Baseline creatinine is 2.21-2.56 in 09/2012. At presentation, creatinine was 3.78, and has risen to 5.35 most likely to over diuresis. Dr Mauricia Area (nephrology) follow along and feels patient may be close to needing HD.   5. Tobacco Abuse: Patient admittedly, smokes a pack of cigarettes per day. Counseled appropriately, and commenced on Nicoderm CQ patch. Counseled patient that considering her MMP smoking is an absolute, indicator   6. deconditioning; will have PT/OT work with patient for strengthening range of motion and safety issues.    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 According to patient she had been compliant with her medication, but ran out about 3 days a go, and was unable to get refill, for insurance reasons. On 05/04/13, she developed a frontal headache, which has persisted, unassociated with visual obscurations. Since  05/05/13, she has had recurrent vomiting, up to 4-5 times daily, including today. Although she is intermittently SOB, this has been going on for about 6 months, and is no worse at this time. She denies chest pain, cough, fever or chills. In the ED, BP was 298/160-230/102. Head CT scan was devoid of acute findings, and CXR revealed stable moderate cardiomegaly. Minimal interstitial pulmonary edema consistent with CHF. [place] [address]. TODAY per patient her dry weight is approximately 165Lbs  current weight 143 Lbs rising creatinine at 5.35   Procedure   cardiac echo on 05/07/2013  - Left ventricle: moderate LVH; LVEF= 60% to 65%.   Features are consistent with a pseudonormal left ventricular filling pattern, with concomitant abnormal relaxation and increased filling pressure (grade 2 diastolic dysfunction). Doppler parameters are consistent with elevated ventricular end-diastolic filling pressure. - Tricuspid valve: Mild regurgitation. - Pericardium, extracardiac: A small pericardial effusion is noted, with no hemodynamic sequelae.     Objective: Filed Vitals:   05/08/13 0931 05/08/13 1125 05/08/13 1215 05/08/13 1354  BP: 197/93 192/91 167/89 142/79  Pulse: 68 73 61 61  Temp:   98.1 F (36.7 C) 97.5 F (36.4 C)  TempSrc:   Oral Oral  Resp:   18 18  Height:   5\' 1"  (1.549 m)   Weight:   64.093 kg (141 lb 4.8 oz)   SpO2:   98% 99%    Intake/Output Summary (Last 24 hours) at 05/08/13 1535 Last data filed at 05/08/13 1123  Gross per 24 hour  Intake    760 ml  Output   1950 ml  Net  -1190 ml   [employer]  05/06/13 1815 05/08/13 1215  Weight: 64.7 kg (142 lb 10.2 oz) 64.093 kg (141 lb 4.8 oz)    Exam:   General:  Alert,NAD  Cardiovascular: Regular rhythm and rate, negative murmurs rubs gallops, DP/PT pulse one plus bilateral  Respiratory: Clear to auscultation bilateral        Abdomen: Obese, soft, nontender, nondistended plus bowel  sounds    Data Reviewed: Basic Metabolic Panel:  Recent Labs Lab 05/06/13 1410 05/07/13 0500 05/08/13 0458  NA 140 141  141 141  K 3.4* 3.6  3.6 3.5  CL 104 108  108 105  CO2 18* 18*  18* 21  GLUCOSE 90 103*  104* 87  BUN 34* 34*  34* 35*  CREATININE 3.78* 4.03*  4.07* 4.59*  CALCIUM 9.7 9.2  9.2 9.8  MG  --   --  1.9  PHOS  --  4.2 4.5   Liver Function Tests:  Recent Labs Lab 05/06/13 1410 05/07/13 0500 05/08/13 0458  AST 14 11 12   ALT 11 9 9   ALKPHOS 89 76 83  BILITOT 0.5 0.3 0.3  PROT 7.8 7.0 7.2  ALBUMIN 4.0 3.5  3.5 3.8   No results found for this basename: LIPASE, AMYLASE,  in the last 168 hours No results found for this basename: AMMONIA,  in the last 168 hours CBC:  Recent Labs Lab 05/06/13 1410 05/07/13 0500 05/08/13 0458  WBC 6.0 4.6 7.1  NEUTROABS 4.4  --   --   HGB 12.5 11.0* 11.5*  HCT 37.4 32.9* 36.1  MCV 82.9 84.1 84.1  PLT 220 208 225   Cardiac Enzymes:  Recent Labs Lab 05/06/13 1410 05/06/13 1710 05/07/13 0006 05/07/13 0447  TROPONINI <0.30 <0.30 <0.30 <0.30   BNP (last 3 results)  Recent Labs  09/25/12 1333 05/06/13 1410 05/07/13 0500  PROBNP 4012.0* [id number]* 20527.0*   CBG: No results found for this basename: GLUCAP,  in the last 168 hours  Recent Results (from the past 240 hour(s))  MRSA PCR SCREENING     Status: None   Collection Time    05/06/13  6:29 PM      Result Value Range Status   MRSA by PCR NEGATIVE  NEGATIVE Final   Comment:            The GeneXpert MRSA Assay (FDA     approved for NASAL specimens     only), is one component of a     comprehensive MRSA colonization     surveillance program. It is not     intended to diagnose MRSA     infection nor to guide or     monitor treatment for     MRSA infections.     Studies: US Renal  05/06/2013   *RADIOLOGY REPORT*  Clinical Data: Acute renal failure superimposed on chronic kidney disease.  RENAL/URINARY TRACT ULTRASOUND COMPLETE   Comparison:  None.  Findings:  Right Kidney:  Right kidney shows diffusely increased parenchymal echogenicity.  It measures 9.8 cm in length.  No right renal mass or stone or hydronephrosis.  Left Kidney:  Left kidney is increased in echogenicity diffusely. There are to lower pole cyst seen laterally, one measuring 9 mm and the other measuring 14 mm in size.  The left kidney measures 9.9 cm in length.  Bladder:  The bladder is unremarkable.  IMPRESSION: Increased renal parenchymal echogenicity bilaterally is consistent with medical renal disease.  No hydronephrosis.  Two small left renal cysts.   Original Report  Authenticated By: Lajean Manes, M.D.   Dg Chest Port 1 View  05/07/2013   *RADIOLOGY REPORT*  Clinical Data: CHF  PORTABLE CHEST - 1 VIEW  Comparison: Prior radiograph from 05/06/2013  Findings:  VP shunt catheter tubing is again seen coursing over the right anterior chest.  Cardiomegaly is not significantly changed as compared to the prior examination.  The lungs are mildly hypoinflated.  Diffuse prominence of the interstitial markings with a few scattered Kerley lines are not significantly changed as compared to the prior study. There is question of a left pleural effusion.  No pneumothorax.  No soft tissues and osseous structures are unchanged.  IMPRESSION: Stable cardiomegaly and mild interstitial pulmonary edema, consistent with CHF.   Original Report Authenticated By: Jeannine Boga, M.D.    Scheduled Meds: . amLODipine  10 mg Oral Daily  . ferumoxytol  510 mg Intravenous Once  . furosemide  80 mg Oral BID  . hydrALAZINE  50 mg Oral TID  . labetalol  400 mg Oral BID  . nicotine  21 mg Transdermal Daily  . sodium chloride  3 mL Intravenous Q12H   Continuous Infusions:   Active Problems:   HTN (hypertension), malignant   CHF (congestive heart failure)   CAD (coronary artery disease)   CKD (chronic kidney disease), stage IV   Hypertensive heart disease   Acute-on-chronic renal  failure    Time spent: 30 minutes    WOODS, CURTIS, J  [facility] Pager [phone]. If 7PM-7AM, please contact night-coverage at [email or web address], password [facility] 05/08/2013, 3:35 PM  LOS: 2 days

## 2013-05-09 DIAGNOSIS — N189 Chronic kidney disease, unspecified: Secondary | ICD-10-CM

## 2013-05-09 DIAGNOSIS — N179 Acute kidney failure, unspecified: Secondary | ICD-10-CM

## 2013-05-09 DIAGNOSIS — I10 Essential (primary) hypertension: Secondary | ICD-10-CM

## 2013-05-09 DIAGNOSIS — F17200 Nicotine dependence, unspecified, uncomplicated: Secondary | ICD-10-CM

## 2013-05-09 LAB — RENAL FUNCTION PANEL
BUN: 38 mg/dL — ABNORMAL HIGH (ref 6–23)
CO2: 19 mEq/L (ref 19–32)
Calcium: 9.8 mg/dL (ref 8.4–10.5)
GFR calc Af Amer: 10 mL/min — ABNORMAL LOW (ref >90)
Glucose, Bld: 88 mg/dL (ref 70–99)
Phosphorus: 6.3 mg/dL — ABNORMAL HIGH (ref 2.3–4.6)
Potassium: 3.3 mEq/L — ABNORMAL LOW (ref 3.5–5.1)
Sodium: 137 mEq/L (ref 135–145)

## 2013-05-09 LAB — CBC
HCT: 34.4 % — ABNORMAL LOW (ref 36.0–46.0)
Hemoglobin: 12.1 g/dL (ref 12.0–15.0)
MCH: 29.1 pg (ref 26.0–34.0)
MCHC: 35.2 g/dL (ref 30.0–36.0)
RBC: 4.16 MIL/uL (ref 3.87–5.11)

## 2013-05-09 LAB — PARATHYROID HORMONE, INTACT (NO CA): PTH: 365.7 pg/mL — ABNORMAL HIGH (ref 14.0–72.0)

## 2013-05-09 MED ORDER — CLONIDINE HCL 0.1 MG/24HR TD PTWK
0.1000 mg | MEDICATED_PATCH | TRANSDERMAL | Status: DC
  Administered 2013-05-09: 0.1 mg via TRANSDERMAL
  Filled 2013-05-09: qty 1

## 2013-05-09 MED ORDER — FUROSEMIDE 80 MG PO TABS
80.0000 mg | ORAL_TABLET | Freq: Every day | ORAL | Status: DC
  Administered 2013-05-09 – 2013-05-10 (×2): 80 mg via ORAL
  Filled 2013-05-09 (×2): qty 1

## 2013-05-09 MED ORDER — SODIUM CHLORIDE 0.9 % IV SOLN: 250.0000 mL | INTRAVENOUS | Status: DC | PRN

## 2013-05-09 NOTE — Progress Notes (Signed)
Nutrition Brief Note  Patient identified on the Malnutrition Screening Tool (MST) Report. Per chart review, pt has had recent poor oral intake, which has now improved, consuming 50-90% of meals.  Weight hx is variable:  Wt Readings from Last 15 Encounters:  05/09/13 140 lb 8 oz (63.73 kg)  09/28/12 169 lb (76.658 kg)  05/03/12 141 lb 1.5 oz (64 kg)  04/27/12 147 lb 11.3 oz (67 kg)  04/07/12 162 lb 0.6 oz (73.5 kg)  04/05/12 161 lb 13.1 oz (73.4 kg)  04/05/12 161 lb 13.1 oz (73.4 kg)  07/07/11 157 lb (71.215 kg)  07/01/11 153 lb (69.4 kg)  06/20/11 152 lb (68.947 kg)  06/13/11 154 lb (69.854 kg)  04/18/11 154 lb (69.854 kg)  06/21/09 148 lb (67.132 kg)    Body mass index is 26.56 kg/(m^2). Patient meets criteria for Overweight based on current BMI.   Current diet order is Renal 80-90, patient is consuming approximately 90% of meals at this time. Labs and medications reviewed.   No nutrition interventions warranted at this time. If nutrition issues arise, please consult RD.   Inda Coke MS, RD, LDN Pager: [phone] After-hours pager: [phone]

## 2013-05-09 NOTE — Progress Notes (Signed)
S: some nausea this am but none now.  Eating well O:BP 162/84  Pulse 69  Temp(Src) 98.2 F (36.8 C) (Oral)  Resp 18  Ht 5\' 1"  (1.549 m)  Wt 63.73 kg (140 lb 8 oz)  BMI 26.56 kg/m2  SpO2 100%  Intake/Output Summary (Last 24 hours) at 05/09/13 1304 Last data filed at 05/09/13 1104  Gross per 24 hour  Intake    480 ml  Output    525 ml  Net    -45 ml   Weight change:  [id number] and alert CVS:RRR Resp:clear Abd:+ BS NTND Ext:no edema NEURO:CNI ox3 no asterixis   . amLODipine  10 mg Oral Daily  . cloNIDine  0.1 mg Transdermal Weekly  . hydrALAZINE  50 mg Oral TID  . labetalol  400 mg Oral BID  . nicotine  21 mg Transdermal Daily  . sodium chloride  3 mL Intravenous Q12H   No results found. BMET    Component Value Date/Time   NA 137 05/09/2013 0700   K 3.3* 05/09/2013 0700   CL 102 05/09/2013 0700   CO2 19 05/09/2013 0700   GLUCOSE 88 05/09/2013 0700   BUN 38* 05/09/2013 0700   CREATININE 5.35* 05/09/2013 0700   CALCIUM 9.8 05/09/2013 0700   GFRNONAA 8* 05/09/2013 0700   GFRAA 10* 05/09/2013 0700   CBC    Component Value Date/Time   WBC 4.2 05/09/2013 0700   RBC 4.16 05/09/2013 0700   HGB 12.1 05/09/2013 0700   HCT 34.4* 05/09/2013 0700   PLT 235 05/09/2013 0700   MCV 82.7 05/09/2013 0700   MCH 29.1 05/09/2013 0700   MCHC 35.2 05/09/2013 0700   RDW 15.3 05/09/2013 0700   LYMPHSABS 1.2 05/06/2013 1410   MONOABS 0.3 05/06/2013 1410   EOSABS 0.1 05/06/2013 1410   BASOSABS 0.0 05/06/2013 1410     Assessment: 1. Acute on CKD due to malignant HTN 2. HTN 3. CAD 4. Mild hypokalemia  Plan: 1. Renal diet 2. Daily renal 3. Await pth level 4.  Discussed possibility of HD and she would do it if needed. 5. Resume lasix po 6. DC IV fluids   [name] T

## 2013-05-09 NOTE — Progress Notes (Signed)
Pt reports that one of the 3 MDs who has seen her today has told her not to take Labetalol nor Hydralazine anymore. RN has read MD notes and orders and has not seen any indication not to take these meds. RN to encourage pt to take her BP meds since this was the reason for her admit. Will monitor VS and continue to monitor.

## 2013-05-09 NOTE — Progress Notes (Signed)
Pt c/o nausea in am PRN zofran given, pt refused to ambulated with staff, pt c/o HA in afternoon, tylenol PRN with good affect, pt stable, will continue to monitor

## 2013-05-10 DIAGNOSIS — N19 Unspecified kidney failure: Secondary | ICD-10-CM

## 2013-05-10 LAB — RENAL FUNCTION PANEL
CO2: 22 mEq/L (ref 19–32)
Calcium: 9.9 mg/dL (ref 8.4–10.5)
Chloride: 102 mEq/L (ref 96–112)
GFR calc Af Amer: 9 mL/min — ABNORMAL LOW (ref >90)
GFR calc non Af Amer: 8 mL/min — ABNORMAL LOW (ref >90)
Glucose, Bld: 94 mg/dL (ref 70–99)
Sodium: 139 mEq/L (ref 135–145)

## 2013-05-10 MED ORDER — CLONIDINE HCL 0.2 MG/24HR TD PTWK: 0.2000 mg | MEDICATED_PATCH | TRANSDERMAL | Status: DC

## 2013-05-10 MED ORDER — SODIUM CHLORIDE 0.9 % IV SOLN
INTRAVENOUS | Status: DC
  Administered 2013-05-10: 16:00:00 via INTRAVENOUS

## 2013-05-10 MED ORDER — LABETALOL HCL 300 MG PO TABS
500.0000 mg | ORAL_TABLET | Freq: Two times a day (BID) | ORAL | Status: DC
  Administered 2013-05-10 – 2013-05-11 (×3): 500 mg via ORAL
  Filled 2013-05-10 (×6): qty 1

## 2013-05-10 NOTE — Progress Notes (Signed)
[facility] PROGRESS NOTE  [name] [id number] DOB: 1961-04-23 DOA: 05/06/2013 PCP: No primary provider on file.  Assessment/Plan: 1. HTN (hypertension), malignant: Patient's hypertension improved from admission  (298/160-230/102), but still elevated.  Inc Labetalol to 500 mg BID and continue Norvasc  10 mg daily. Inc clonidine patch 0.2 mg. If Pt's BP still uncontrolled will add alpha blkr. .  2. CHF (congestive heart failure): Patient states had a dry weight of 165 pounds start gently hydrating patient 50 ml/hr secondary to patient's increasing kidney failure. D/C Lasix Current weight 139 lbs s 3. CAD (coronary artery disease): Patient has known CAD, s/p NSTEMI 2009, s/p DES. 12-Lead EKG is devoid of acute ischemic changes. No clinical evidence of ACS at this time.   4. CKD (chronic kidney disease), stage IV: Baseline creatinine is 2.21-2.56 in 09/2012. At presentation, creatinine was 3.78, and has risen to 5.35 most likely to over diuresis. Dr Mauricia Area (nephrology) follow along and feels patient may be close to needing HD.   5. Tobacco Abuse: Patient admittedly, smokes a pack of cigarettes per day. Counseled appropriately, and commenced on Nicoderm CQ patch. Counseled patient that considering her MMP smoking is an absolute, indicator   6. deconditioning; will have PT/OT work with patient for strengthening range of motion and safety issues.    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 According to patient she had been compliant with her medication, but ran out about 3 days a go, and was unable to get refill, for insurance reasons. On 05/04/13, she  developed a frontal headache, which has persisted, unassociated with visual obscurations. Since 05/05/13, she has had recurrent vomiting, up to 4-5 times daily, including today. Although she is intermittently SOB, this has been going on for about 6 months, and is no worse at this time. She denies chest pain, cough, fever or chills. In the ED, BP was 298/160-230/102. Head CT scan was devoid of acute findings, and CXR revealed stable moderate cardiomegaly. Minimal interstitial pulmonary edema consistent with CHF. [place] elevated at [address]. TODAY per patient her dry weight is approximately 165Lbs  current weight 143 Lbs rising creatinine at 5.53   Procedure   cardiac echo on 05/07/2013  - Left ventricle: moderate LVH; LVEF= 60% to 65%.   Features are consistent with a pseudonormal left ventricular filling pattern, with concomitant abnormal relaxation and increased filling pressure (grade 2 diastolic dysfunction). Doppler parameters are consistent with elevated ventricular end-diastolic filling pressure. - Tricuspid valve: Mild regurgitation. - Pericardium, extracardiac: A small pericardial effusion is noted, with no hemodynamic sequelae.     Objective: Filed Vitals:   05/09/13 2147 05/10/13 0120 05/10/13 0550 05/10/13 1024  BP: 178/87 156/91 127/93 198/99  Pulse: 65 67 66   Temp: 98 F (36.7 C) 97.5 F (36.4 C) 97.8 F (36.6 C)   TempSrc: Oral Oral Oral   Resp: 20  18   Height:      Weight:   63.322 kg (139 lb 9.6 oz)   SpO2: 95% 100% 99%     Intake/Output Summary (Last 24 hours) at 05/10/13 1305 Last data filed at 05/10/13 1029  Gross per 24 hour  Intake    600 ml  Output    550 ml  Net     50 ml   Filed Weights   05/08/13 1215 05/09/13 0528 05/10/13 0550  Weight: 64.093 kg (141 lb 4.8 oz) 63.73 kg (140 lb 8 oz) 63.322 kg (139 lb 9.6 oz)    Exam:   General:  Alert,NAD  Cardiovascular: Regular rhythm and rate, negative murmurs rubs gallops, DP/PT pulse one  plus bilateral  Respiratory: Clear to auscultation bilateral        Abdomen: Obese, soft, nontender, nondistended plus bowel sounds    Data Reviewed: Basic Metabolic Panel:  Recent Labs Lab 05/06/13 1410 05/07/13 0500 05/08/13 0458 05/09/13 0700 05/10/13 0430  NA 140 141  141 141 137 139  K 3.4* 3.6  3.6 3.5 3.3* 3.6  CL 104 108  108 105 102 102  CO2 18* 18*  18* 21 19 22   GLUCOSE 90 103*  104* 87 88 94  BUN 34* 34*  34* 35* 38* 37*  CREATININE 3.78* 4.03*  4.07* 4.59* 5.35* 5.53*  CALCIUM 9.7 9.2  9.2 9.8 9.8 9.9  MG  --   --  1.9  --   --   PHOS  --  4.2 4.5 6.3* 6.3*   Liver Function Tests:  Recent Labs Lab 05/06/13 1410 05/07/13 0500 05/08/13 0458 05/09/13 0700 05/10/13 0430  AST 14 11 12   --   --   ALT 11 9 9   --   --   ALKPHOS 89 76 83  --   --   BILITOT 0.5 0.3 0.3  --   --   PROT 7.8 7.0 7.2  --   --   ALBUMIN 4.0 3.5  3.5 3.8 3.6 3.8   No results found for this basename: LIPASE, AMYLASE,  in the last 168 hours No results found for this basename: AMMONIA,  in the last 168 hours CBC:  Recent Labs Lab 05/06/13 1410 05/07/13 0500 05/08/13 0458 05/09/13 0700  WBC 6.0 4.6 7.1 4.2  NEUTROABS 4.4  --   --   --   HGB 12.5 11.0* 11.5* 12.1  HCT 37.4 32.9* 36.1 34.4*  MCV 82.9 84.1 84.1 82.7  PLT 220 208 225 235   Cardiac Enzymes:  Recent Labs Lab 05/06/13 1410 05/06/13 1710 05/07/13 0006 05/07/13 0447  TROPONINI <0.30 <0.30 <0.30 <0.30   BNP (last 3 results)  Recent Labs  09/25/12 1333 05/06/13 1410 05/07/13 0500  PROBNP 4012.0* [id number]* 20527.0*   CBG: No results found for this basename: GLUCAP,  in the last 168 hours  Recent Results (from the past 240 hour(s))  MRSA PCR SCREENING     Status: None   Collection Time    05/06/13  6:29 PM      Result Value Range Status   MRSA by PCR NEGATIVE  NEGATIVE Final   Comment:            The GeneXpert MRSA Assay (FDA     approved for NASAL specimens     only), is one component  of a     comprehensive MRSA colonization     surveillance program. It is not     intended to diagnose MRSA     infection nor to guide or     monitor treatment for     MRSA infections.     Studies: No results found.  Scheduled Meds: . amLODipine  10 mg Oral Daily  . cloNIDine  0.1 mg Transdermal Weekly  . furosemide  80 mg Oral  Daily  . hydrALAZINE  50 mg Oral TID  . labetalol  400 mg Oral BID  . nicotine  21 mg Transdermal Daily  . sodium chloride  3 mL Intravenous Q12H   Continuous Infusions:   Active Problems:   HTN (hypertension), malignant   CHF (congestive heart failure)   CAD (coronary artery disease)   CKD (chronic kidney disease), stage IV   Hypertensive heart disease   Acute-on-chronic renal failure    Time spent: 30 minutes    WOODS, CURTIS, J  [facility] Pager [phone]. If 7PM-7AM, please contact night-coverage at [email or web address], password [facility] 05/10/2013, 1:05 PM  LOS: 4 days

## 2013-05-10 NOTE — Progress Notes (Signed)
S:  No N/V  Eating well O:BP 127/93  Pulse 66  Temp(Src) 97.8 F (36.6 C) (Oral)  Resp 18  Ht 5\' 1"  (1.549 m)  Wt 63.322 kg (139 lb 9.6 oz)  BMI 26.39 kg/m2  SpO2 99%  Intake/Output Summary (Last 24 hours) at 05/10/13 0919 Last data filed at 05/10/13 0809  Gross per 24 hour  Intake    600 ml  Output    500 ml  Net    100 ml   Weight change: -0.771 kg (-1 lb 11.2 oz) [id number] and alert CVS:RRR Resp:clear Abd:+ BS NTND Ext:no edema NEURO:CNI ox3 no asterixis   . amLODipine  10 mg Oral Daily  . cloNIDine  0.1 mg Transdermal Weekly  . furosemide  80 mg Oral Daily  . hydrALAZINE  50 mg Oral TID  . labetalol  400 mg Oral BID  . nicotine  21 mg Transdermal Daily  . sodium chloride  3 mL Intravenous Q12H   No results found. BMET    Component Value Date/Time   NA 139 05/10/2013 0430   K 3.6 05/10/2013 0430   CL 102 05/10/2013 0430   CO2 22 05/10/2013 0430   GLUCOSE 94 05/10/2013 0430   BUN 37* 05/10/2013 0430   CREATININE 5.53* 05/10/2013 0430   CALCIUM 9.9 05/10/2013 0430   GFRNONAA 8* 05/10/2013 0430   GFRAA 9* 05/10/2013 0430   CBC    Component Value Date/Time   WBC 4.2 05/09/2013 0700   RBC 4.16 05/09/2013 0700   HGB 12.1 05/09/2013 0700   HCT 34.4* 05/09/2013 0700   PLT 235 05/09/2013 0700   MCV 82.7 05/09/2013 0700   MCH 29.1 05/09/2013 0700   MCHC 35.2 05/09/2013 0700   RDW 15.3 05/09/2013 0700   LYMPHSABS 1.2 05/06/2013 1410   MONOABS 0.3 05/06/2013 1410   EOSABS 0.1 05/06/2013 1410   BASOSABS 0.0 05/06/2013 1410     Assessment: 1. Acute on CKD due to malignant HTN, rate of rise of Scr lee 2. HTN, BP under better control 3. CAD 4. Mild hypokalemia 5. Sec HPTH  Plan: 1. Start calcitriol 2. Show her dialysis videos since she is here 3.  Upper extremity vein mapping 4. Daily Scr   Josph Norfleet T

## 2013-05-10 NOTE — Progress Notes (Signed)
Report given to receiving RN. No questions or concerns at this time. Patient is stable and appears in no acute distress or discomfort.

## 2013-05-10 NOTE — Progress Notes (Signed)
OT Cancellation Note and Discharge  Patient Details Name: [name] MRN: [id number] DOB: [birth year]   Cancelled Treatment:    Reason Eval/Treat Not Completed: OT screened, no needs identified, will sign off. Noted from PT note that pt is close to baseline just with decreased endurance. I went in and introduced myself and my role, pt reports that she does not foresee any issues with BADLs and will have A if needed. No OT needs identified, will sign off.  Almon Register [id number] 05/10/2013, 1:53 PM

## 2013-05-10 NOTE — Evaluation (Signed)
Physical Therapy Evaluation Patient Details Name: [name] MRN: [id number] DOB: 01/09/1961 Today's Date: 05/10/2013 Time: [id number] PT Time Calculation (min): 17 min  PT Assessment / Plan / Recommendation History of Present Illness  52 yo female admitted to [facility] 7/18 with accelerated hypertension was [facility]t o [facility] on 7/20 She also has hx of CHF, CAD CRF and multilpe spontaneous intracerebral bleeds.  Clinical Impression  Pt appears to be close to baseline for functional mobility.  She is able to ambulate on levels and steps without assistive device, but is deconditioned.  Recommend she walk ad lib with nursing supervision in the hallway 2-3 times a day.  Will follow for PT while she is on acute, but do not think she needs follow up PT at discharge    PT Assessment  Patient needs continued PT services    Follow Up Recommendations  No PT follow up    Does the patient have the potential to tolerate intense rehabilitation      Barriers to Discharge        Equipment Recommendations  None recommended by PT    Recommendations for Other Services     Frequency Min 3X/week    Precautions / Restrictions     Pertinent Vitals/Pain No c/o pain      Mobility  Bed Mobility Bed Mobility: Supine to Sit;Sit to Supine Supine to Sit: 7: Independent Sit to Supine: 7: Independent Transfers Transfers: Sit to Stand;Stand to Sit Sit to Stand: 7: Independent Stand to Sit: 7: Independent Ambulation/Gait Ambulation/Gait Assistance: 6: Modified independent (Device/Increase time) (decreased speed) Ambulation Distance (Feet): 150 Feet Assistive device: None Ambulation/Gait Assistance Details: safety Gait Pattern: Decreased step length - right;Decreased step length - left;Step-through pattern;Narrow base of support;Scissoring;Shuffle Gait velocity: decreased General Gait Details: Pt has impairments from longstanding increased tone especially in left leg. Pt self selects a slower pace and is  not comfortable increasing speed. "I might fall" Stairs: Yes Stairs Assistance: 5: Supervision Stair Management Technique: One rail Right;Alternating pattern Number of Stairs: 4 Wheelchair Mobility Wheelchair Mobility: No    Exercises General Exercises - Lower Extremity Hip ABduction/ADduction: AROM;Both;5 reps;Sidelying Hip Flexion/Marching: AROM;Both;5 reps;Supine   PT Diagnosis: Difficulty walking;Generalized weakness  PT Problem List: Decreased strength;Decreased activity tolerance PT Treatment Interventions: Gait training;Therapeutic activities;Therapeutic exercise     PT Goals(Current goals can be found in the care plan section) Acute Rehab PT Goals Patient Stated Goal: to go home with her family PT Goal Formulation: With patient Time For Goal Achievement: 05/24/13 Potential to Achieve Goals: Good  Visit Information  Last PT Received On: 05/10/13 History of Present Illness: 52 yo female admitted [facility]/18 with accelerated hypertension was [facility]20 She also has hx of CHF, CAD CRF and multilpe spontaneous intracerebral bleeds.       Prior [place] expects to be discharged to:: Private residence Living Arrangements: Children Available Help at Discharge: Family Type of Home: House Home Access: Stairs to enter Technical brewer of Steps: 3 with wall eside them Home Layout: One level Home Equipment: None (pt says she doesn't like a cane or a walker) Prior Function Level of Independence: Independent Comments: Per pt, daughter and children intermittently there. Communication Communication: No difficulties    Cognition  Cognition Arousal/Alertness: Awake/alert Behavior During Therapy: WFL for tasks assessed/performed Overall Cognitive Status: Within Functional Limits for tasks assessed    Extremity/Trunk Assessment Lower Extremity Assessment Lower Extremity Assessment: Generalized weakness;LLE deficits/detail LLE  Deficits / Details: ankle  clonus evident with quick stretch to heelcord   Balance Balance Balance Assessed: Yes Static Sitting Balance Static Sitting - Balance Support: No upper extremity supported Static Sitting - Level of Assistance: 7: Independent Static Standing Balance Static Standing - Balance Support: No upper extremity supported Static Standing - Level of Assistance: 7: Independent Standardized Balance Assessment Standardized Balance Assessment: Dynamic Gait Index Dynamic Gait Index Level Surface: Mild Impairment Change in Gait Speed: Moderate Impairment Gait with Horizontal Head Turns: Mild Impairment Gait with Vertical Head Turns: Mild Impairment Gait and Pivot Turn: Mild Impairment Step Over Obstacle: Mild Impairment Step Around Obstacles: Mild Impairment Steps: Mild Impairment Total Score: 15  End of Session PT - End of Session Activity Tolerance: Patient tolerated treatment well Patient left: in bed Nurse Communication: Mobility status  GP    Donato Heinz. Owens Shark, PT 05/10/2013, 10:22 AM

## 2013-05-10 NOTE — Progress Notes (Signed)
Subjective:  Denies chest pain or dyspnea.  Objective:  Vital Signs in the last 24 hours: BP 127/93  Pulse 66  Temp(Src) 97.8 F (36.6 C) (Oral)  Resp 18  Ht 5\' 1"  (1.549 m)  Wt 139 lb 9.6 oz (63.322 kg)  BMI 26.39 kg/m2  SpO2 99%  Physical Exam: Small framed BF lethargic, in NAD Neck: supple Lungs:  Clear  Cardiac:  Regular rhythm Abdomen:  Soft, nontender, no masses Extremities:  No edema present  Intake/Output from previous day: 07/21 0701 - 07/22 0700 In: 480 [P.O.:480] Out: 625 [Urine:625]  Weight Filed Weights   05/08/13 1215 05/09/13 0528 05/10/13 0550  Weight: 141 lb 4.8 oz (64.093 kg) 140 lb 8 oz (63.73 kg) 139 lb 9.6 oz (63.322 kg)    Lab Results: Basic Metabolic Panel:  Recent Labs  05/09/13 0700 05/10/13 0430  NA 137 139  K 3.3* 3.6  CL 102 102  CO2 19 22  GLUCOSE 88 94  BUN 38* 37*  CREATININE 5.35* 5.53*   CBC:  Recent Labs  05/08/13 0458 05/09/13 0700  WBC 7.1 4.2  HGB 11.5* 12.1  HCT 36.1 34.4*  MCV 84.1 82.7  PLT 225 235   Telemetry: Sinus bradycardia  Assessment/Plan:  1. Malignant hypertension improving 2. Acute on chronic renal failure 3. Non compliance 4. History of CAD 5. History of SAH  Rec:  Blood pressure is improving. Continue present meds and can be adjusted as outpt. Nephrology following and may need dialysis in the near future. I stressed compliance.   Kirk Ruths MD [facility]  05/10/2013, 9:16 AM

## 2013-05-11 DIAGNOSIS — Z01810 Encounter for preprocedural cardiovascular examination: Secondary | ICD-10-CM

## 2013-05-11 DIAGNOSIS — N186 End stage renal disease: Secondary | ICD-10-CM

## 2013-05-11 MED ORDER — HYDRALAZINE HCL 50 MG PO TABS
100.0000 mg | ORAL_TABLET | Freq: Three times a day (TID) | ORAL | Status: DC
  Administered 2013-05-11 – 2013-05-14 (×9): 100 mg via ORAL
  Filled 2013-05-11 (×11): qty 2

## 2013-05-11 MED ORDER — FUROSEMIDE 80 MG PO TABS
80.0000 mg | ORAL_TABLET | Freq: Two times a day (BID) | ORAL | Status: DC
  Administered 2013-05-11 – 2013-05-14 (×6): 80 mg via ORAL
  Filled 2013-05-11 (×8): qty 1

## 2013-05-11 NOTE — Progress Notes (Addendum)
VASCULAR LAB PRELIMINARY  PRELIMINARY  PRELIMINARY  PRELIMINARY   Right  Upper Extremity Vein Map    Cephalic  Segment Diameter Depth Comment  1. Axilla 2.59mm mm   2. Mid upper arm 1.53mm mm   3. Above AC 1.70mm mm   4. In [facility] 4.28mm mm thrombus  5. Below AC 1.46mm mm Thrombus, branch  6. Mid forearm 1.55mm mm thrombus  7. Wrist mm mm    mm mm    mm mm    mm mm    Basilic  Segment Diameter Depth Comment  1. Axilla mm mm   2. Mid upper arm 4.55mm 9.22mm origin  3. Above AC 3.37mm 11.22mm   4. In [facility] 3.52mm 8.22mm branch  5. Below AC 1.94mm 4.64mm   6. Mid forearm mm mm   7. Wrist mm mm    mm mm    mm mm    mm mm      Left Upper Extremity Vein Map    Cephalic  Segment Diameter Depth Comment  1. Axilla 1.51m mm   2. Mid upper arm 1.20mm mm   3. Above AC 2.14mm mm thrombus  4. In AC 2.19mm mm thrombus  5. Below AC 1.85mm mm   6. Mid forearm mm mm   7. Wrist mm mm    mm mm    mm mm    mm mm    Basilic  Segment Diameter Depth Comment  1. Axilla mm mm   2. Mid upper arm 2.23mm 15.39mm origin  3. Above AC 2.73mm 12.76mm   4. In AC 1.5mm 11.33mm   5. Below AC mm mm   6. Mid forearm mm mm   7. Wrist mm mm    mm mm    mm mm    mm mm      Landry Mellow, RDMS, RVT  05/11/2013, 10:09 AM

## 2013-05-11 NOTE — Progress Notes (Signed)
S:  No N/V  Eating well.  She is agreeable to getting AVF placed now O:BP 178/84  Pulse 64  Temp(Src) 98.2 F (36.8 C) (Oral)  Resp 18  Ht 5\' 1"  (1.549 m)  Wt 63.4 kg (139 lb 12.4 oz)  BMI 26.42 kg/m2  SpO2 100%  Intake/Output Summary (Last 24 hours) at 05/11/13 1022 Last data filed at 05/11/13 0729  Gross per 24 hour  Intake  887.5 ml  Output    900 ml  Net  -12.5 ml   Weight change: 0.078 kg (2.8 oz) [id number] and alert CVS:RRR Resp:clear Abd:+ BS NTND Ext:no edema NEURO:CNI ox3 no asterixis   . amLODipine  10 mg Oral Daily  . [START ON 05/16/2013] cloNIDine  0.2 mg Transdermal Weekly  . furosemide  80 mg Oral BID  . hydrALAZINE  50 mg Oral TID  . labetalol  500 mg Oral BID  . nicotine  21 mg Transdermal Daily  . sodium chloride  3 mL Intravenous Q12H   No results found. BMET    Component Value Date/Time   NA 139 05/10/2013 0430   K 3.6 05/10/2013 0430   CL 102 05/10/2013 0430   CO2 22 05/10/2013 0430   GLUCOSE 94 05/10/2013 0430   BUN 37* 05/10/2013 0430   CREATININE 5.53* 05/10/2013 0430   CALCIUM 9.9 05/10/2013 0430   GFRNONAA 8* 05/10/2013 0430   GFRAA 9* 05/10/2013 0430   CBC    Component Value Date/Time   WBC 4.2 05/09/2013 0700   RBC 4.16 05/09/2013 0700   HGB 12.1 05/09/2013 0700   HCT 34.4* 05/09/2013 0700   PLT 235 05/09/2013 0700   MCV 82.7 05/09/2013 0700   MCH 29.1 05/09/2013 0700   MCHC 35.2 05/09/2013 0700   RDW 15.3 05/09/2013 0700   LYMPHSABS 1.2 05/06/2013 1410   MONOABS 0.3 05/06/2013 1410   EOSABS 0.1 05/06/2013 1410   BASOSABS 0.0 05/06/2013 1410     Assessment: 1. Acute on CKD due to malignant HTN, rate of rise of Scr lee 2. HTN,  3. CAD 4. Mild hypokalemia, improved 5. Sec HPTH on calcitriol  Plan: 1. Lasix ordered again 2.  Will ask [facility] to see about placing AVF.  Not sure yet if will need HD this hospitalization 3. Cont with daily Scr 4. DC IV fluids   Darrold Bezek T

## 2013-05-11 NOTE — Consult Note (Signed)
[facility] Consult  Reason for Consult:  CKD in need of HD access Referring Physician:  Mercy Moore  [id number]  History of Present Illness: This is a 52 y.o. female who was admitted several days ago with chest pain per the pt.  She states that she did have a headache, but no visual changes.  She was also evaluated for malignant hypertension by cardiology as she had run out of her antihypertensive medication.  Her systolic BP on arrival was 298.  She   She does have acute on chronic kidney disease.  She has had increasing SOB.  She states that she still smokes about 3 cigarettes per day.  [facility] is consulted for permanent HD access.   Past Medical History  Diagnosis Date  . HTN (hypertension)     resistant  . Hypercholesterolemia   . Tobacco abuse     resumed smoking half a pack a day. She was a smoker in the past and states she was abl eto stop in the past using nicotine patches  . CAD (coronary artery disease)     Pt has St elevation MI in Feb 2009. Left hear tcath at tha ttime showed a 70% distal LAD lesion that was hazy consistent with a plaque rupture. She had a 50% distal circumflex stenosis an a 95% distal RCA stenosis. She did havve drug eluting stent placed in her distal RCA  . Chronic kidney disease     most recent creatinine 1.4 in 3/10  . Intracranial hemorrhage, spontaneous intraparenchymal, idiopathic, remote, resolved   . Diastolic heart failure     pt most recent echo was in Feb 2009 w an EF of 60% and severe left ventricular hypertrophy. The pt did have evidence of diastolic dysfunction. The RV appeared normal  . Subarachnoid hemorrhage     03/2012   Past Surgical History  Procedure Laterality Date  . Ventriculoperitoneal shunt  03/27/2012    Procedure: SHUNT INSERTION VENTRICULAR-PERITONEAL;  Surgeon: Winfield Cunas, MD;  Location: [place] [facility];  Service: Neurosurgery;  Laterality: N/A;  Ventricular-Peritoneal Shunt Insertion  . Cardiac catheterization    .  Coronary angioplasty    . Abdominal hysterectomy      Allergies  Allergen Reactions  . Ampicillin Other (See Comments)    unknown  . Penicillins Other (See Comments)    unknown    Prior to Admission medications   Medication Sig Start Date End Date Taking? Authorizing Provider  hydrALAZINE (APRESOLINE) 50 MG tablet Take 1 tablet (50 mg total) by mouth 3 (three) times daily. 09/28/12  Yes Ripudeep Krystal Eaton, MD  labetalol (NORMODYNE) 200 MG tablet Take 1 tablet (200 mg total) by mouth 3 (three) times daily. 09/28/12  Yes Ripudeep Krystal Eaton, MD  nitroGLYCERIN (NITROSTAT) 0.4 MG SL tablet Place 0.4 mg under the tongue every 5 (five) minutes as needed for chest pain.  04/27/12  Yes Sorin June Leap, MD    History   Social History  . Marital Status: Single    Spouse Name: N/A    Number of Children: N/A  . Years of Education: N/A   Occupational History  . Not on file.   Social History Main Topics  . Smoking status: Current Every Day Smoker -- 0.20 packs/day for 35 years    Types: Cigarettes  . Smokeless tobacco: Never Used     Comment: 1/2 ppd   . Alcohol Use: No  . Drug Use: No  . Sexually Active: Not on file  Other Topics Concern  . Not on file   Social History Narrative   Lives by herself but helps care for her 8 grandchildren.     Hx of blood clot - mother  Family History  Problem Relation Age of Onset  . Coronary artery disease Other     premature in 1st degree relatives    ROS: [x]  Positive   [ ]  Negative   [ ]  All sytems reviewed and are negative  Cardiovascular: [x]  chest pain/pressure-hx CAD and s/p NSTEMI '09 [x]  HTN []  palpitations []  SOB lying flat []  DOE []  pain in legs while walking []  pain in feet when lying flat []  hx of DVT []  hx of phlebitis []  swelling in legs []  varicose veins  Pulmonary: []  productive cough []  asthma []  wheezing  Neurologic: [x]  headache [x]  hx intracerebral hemorrhage [x]  hx of SAH 2003 (required V-P shunt) []  weakness in  []  arms []  legs []  numbness in []  arms []  legs [] difficulty speaking or slurred speech []  temporary loss of vision in one eye []  dizziness  Hematologic: []  bleeding problems []  problems with blood clotting easily  Endocrine:   []  diabetes []  thyroid disease  GI [x]  vomiting []  vomiting blood []  blood in stool  GU: []  burning with urination []  blood in urine  Psychiatric: []  hx of major depression  Integumentary: []  rashes []  ulcers  Constitutional: []  fever []  chills   Physical Examination  Filed Vitals:   05/11/13 0929  BP:   Pulse: 64  Temp:   Resp:    Body mass index is 26.42 kg/(m^2).  General:  WDWN in NAD Gait: Not observed HENT: WNL, normocephalic Eyes: Pupils equal Pulmonary: normal non-labored breathing , without Rales, rhonchi,  wheezing Cardiac: RRR, without  Murmurs, rubs or gallops; without carotid bruits Abdomen: soft, NT, no masses Skin: without rashes, without ulcers  Vascular Exam/Pulses:+ palpable ulnar/radial pulses bilaterally; + palpable bilateral femoral and pedal pulses bilaterally Extremities: without ischemic changes, without Gangrene , without cellulitis; without open wounds;  Musculoskeletal: no muscle wasting or atrophy  Neurologic: A&O X 3; Appropriate Affect ; SENSATION: normal; MOTOR FUNCTION:  moving all extremities equally. Speech is fluent/normal   CBC    Component Value Date/Time   WBC 4.2 05/09/2013 0700   RBC 4.16 05/09/2013 0700   HGB 12.1 05/09/2013 0700   HCT 34.4* 05/09/2013 0700   PLT 235 05/09/2013 0700   MCV 82.7 05/09/2013 0700   MCH 29.1 05/09/2013 0700   MCHC 35.2 05/09/2013 0700   RDW 15.3 05/09/2013 0700   LYMPHSABS 1.2 05/06/2013 1410   MONOABS 0.3 05/06/2013 1410   EOSABS 0.1 05/06/2013 1410   BASOSABS 0.0 05/06/2013 1410    BMET    Component Value Date/Time   NA 139 05/10/2013 0430   K 3.6 05/10/2013 0430   CL 102 05/10/2013 0430   CO2 22 05/10/2013 0430   GLUCOSE 94 05/10/2013 0430   BUN 37*  05/10/2013 0430   CREATININE 5.53* 05/10/2013 0430   CALCIUM 9.9 05/10/2013 0430   GFRNONAA 8* 05/10/2013 0430   GFRAA 9* 05/10/2013 0430     Non-Invasive Vascular Imaging:  Vein mapping [id number] Cephalic - right Segment  Diameter  Depth  Comment   1. Axilla  2.76mm  mm    2. Mid upper arm  1.5mm  mm    3. Above AC  1.2mm  mm    4. In [facility]  4.86mm  mm  thrombus   5. Below [facility]  1.22mm  mm  Thrombus, branch   6. Mid forearm  1.29mm  mm  thrombus   7. Wrist  mm  mm     mm  mm     mm  mm     mm  mm    Basilic - right  Segment  Diameter  Depth  Comment   1. Axilla  mm  mm    2. Mid upper arm  4.50mm  9.69mm  origin   3. Above AC  3.68mm  11.16mm    4. In [facility]  3.80mm  8.33mm  branch   5. Below AC  1.35mm  4.27mm    6. Mid forearm  mm  mm    7. Wrist  mm  mm     mm  mm     mm  mm     mm  mm     Cephalic - left Segment  Diameter  Depth  Comment   1. Axilla  1.2m  mm    2. Mid upper arm  1.90mm  mm    3. Above AC  2.37mm  mm  thrombus   4. In AC  2.24mm  mm  thrombus   5. Below AC  1.46mm  mm    6. Mid forearm  mm  mm    7. Wrist  mm  mm     mm  mm     mm  mm     mm  mm    Basilic - left Segment  Diameter  Depth  Comment   1. Axilla  mm  mm    2. Mid upper arm  2.49mm  15.78mm  origin   3. Above AC  2.75mm  12.82mm    4. In AC  1.81mm  11.52mm    5. Below AC  mm  mm    6. Mid forearm  mm  mm    7. Wrist  mm  mm        ASSESSMENT/PLAN: This is a 52 y.o. female with malignant HTN and AKI on CKD who is in need of permanent HD access.  Pt is left handed.  -bilateral cephalic veins are not adequate for access -her right basilic vein is adequate for access -will restrict the right arm and have them move the IV to the left arm.. -possible to schedule for Friday by Dr. Kellie Simmering.   Leontine Locket, PA-C [facility] [phone]  Patient examined and I agree with the above assessment Cephalic veins are inadequate or thrombosed bilaterally Left basilic vein is  too small Right basilic vein is borderline but in my opinion were of attempting a basilic vein transposition  Discussed this with patient she is agreeable to the right basilic vein transposition on Friday Also discussed with her that this may not mature into satisfactory access because of size and this is the case she will need an AV graft in her left upper extremity

## 2013-05-11 NOTE — Progress Notes (Signed)
Report given to receiving RN. No questions or concerns at this time. Patient is stable and appears in no acute distress or discomfort.

## 2013-05-11 NOTE — Progress Notes (Signed)
[facility] PROGRESS NOTE  Rhealyn Storms [id number] DOB: [birth year] DOA: 05/06/2013 PCP: No primary provider on file.  Assessment/Plan: Active Problems:   HTN (hypertension), malignant   CHF (congestive heart failure)   CAD (coronary artery disease)   CKD (chronic kidney disease), stage IV   Hypertensive heart disease   Acute-on-chronic renal failure    Assessment/Plan:  1. HTN (hypertension), malignant: Patient's hypertension improved from admission (298/160-230/102), but still elevated. Inc Labetalol to 500 mg BID and continue Norvasc 10 mg daily. Inc clonidine patch 0.2 mg. If Pt's BP still uncontrolled will add alpha blkr.  .  2. CHF (congestive heart failure): Patient states had a dry weight of 165 pounds start gently hydrating patient 50 ml/hr secondary to patient's increasing kidney failure. D/C Lasix Current weight 139 lbs  s  3. CAD (coronary artery disease): Patient has known CAD, s/p NSTEMI 2009, s/p DES. 12-Lead EKG is devoid of acute ischemic changes. No clinical evidence of ACS at this time.    4. CKD (chronic kidney disease), stage IV: Baseline creatinine is 2.21-2.56 in 09/2012. At presentation, creatinine was 3.78, and has risen to 5.35 most likely to over diuresis. Dr Jeneen Rinks Deterding. Upper extremity vein mapping to be done (nephrology) follow along and feels patient may be close to needing HD.    5. Tobacco Abuse: Patient admittedly, smokes a pack of cigarettes per day. Counseled appropriately, and commenced on Nicoderm CQ patch. Counseled patient that considering her MMP smoking is an absolute, indicator   6. 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 According to patient she had been compliant with her medication, but ran out about 3 days a go, and was unable to get refill, for insurance reasons. On 05/04/13, she developed a frontal headache, which has persisted, unassociated with visual obscurations. Since 05/05/13, she has had recurrent vomiting, up to 4-5 times daily, including today. Although she is intermittently SOB, this has been going on for about 6 months, and is no worse at this time. She denies chest pain, cough, fever or chills. In the ED, BP was 298/160-230/102. Head CT scan was devoid of acute findings, and CXR revealed stable moderate cardiomegaly. Minimal interstitial pulmonary edema consistent with CHF. [place]BNP [place] [address]. TODAY per patient her dry weight is approximately 165Lbs current weight 143 Lbs rising creatinine at 5.53  Procedure  cardiac echo on 05/07/2013   - Left ventricle: moderate LVH; LVEF= 60% to 65%.  Features are consistent with a pseudonormal left ventricular filling pattern, with concomitant abnormal relaxation and increased filling pressure (grade 2 diastolic dysfunction). Doppler parameters are consistent with elevated ventricular end-diastolic filling pressure. - Tricuspid valve: Mild regurgitation. - Pericardium, extracardiac: A small pericardial effusion is noted, with no hemodynamic sequelae.      HPI/Subjective:    Objective: Filed Vitals:   05/10/13 1527 05/10/13 1732 05/10/13 2156 05/11/13 0554  BP: 146/80 171/68 180/87 159/90  Pulse: 64 62 64 71  Temp: 98 F (36.7 C)  97.7 F (36.5 C) 98.2 F (36.8 C)  TempSrc: Oral  Oral Oral  Resp: 18  18 18   Height:      Weight:  63.4 kg (139 lb 12.4 oz)  SpO2: 96%  100% 100%    Intake/Output Summary (Last 24 hours) at 05/11/13 0744 Last data filed at 05/11/13 0729  Gross per 24 hour  Intake 1127.5 ml  Output    900 ml  Net  227.5 ml    Exam:  Small framed BF  lethargic, in NAD  Neck: supple  Lungs: Clear  Cardiac: Regular rhythm  Abdomen: Soft, nontender, no masses  Extremities: No edema present    Data Reviewed: Basic Metabolic Panel:  Recent Labs Lab 05/06/13 1410 05/07/13 0500 05/08/13 0458 05/09/13 0700 05/10/13 0430  NA 140 141  141 141 137 139  K 3.4* 3.6  3.6 3.5 3.3* 3.6  CL 104 108  108 105 102 102  CO2 18* 18*  18* 21 19 22   GLUCOSE 90 103*  104* 87 88 94  BUN 34* 34*  34* 35* 38* 37*  CREATININE 3.78* 4.03*  4.07* 4.59* 5.35* 5.53*  CALCIUM 9.7 9.2  9.2 9.8 9.8 9.9  MG  --   --  1.9  --   --   PHOS  --  4.2 4.5 6.3* 6.3*    Liver Function Tests:  Recent Labs Lab 05/06/13 1410 05/07/13 0500 05/08/13 0458 05/09/13 0700 05/10/13 0430  AST 14 11 12   --   --   ALT 11 9 9   --   --   ALKPHOS 89 76 83  --   --   BILITOT 0.5 0.3 0.3  --   --   PROT 7.8 7.0 7.2  --   --   ALBUMIN 4.0 3.5  3.5 3.8 3.6 3.8   No results found for this basename: LIPASE, AMYLASE,  in the last 168 hours No results found for this basename: AMMONIA,  in the last 168 hours  CBC:  Recent Labs Lab 05/06/13 1410 05/07/13 0500 05/08/13 0458 05/09/13 0700  WBC 6.0 4.6 7.1 4.2  NEUTROABS 4.4  --   --   --   HGB 12.5 11.0* 11.5* 12.1  HCT 37.4 32.9* 36.1 34.4*  MCV 82.9 84.1 84.1 82.7  PLT 220 208 225 235    Cardiac Enzymes:  Recent Labs Lab 05/06/13 1410 05/06/13 1710 05/07/13 0006 05/07/13 0447  TROPONINI <0.30 <0.30 <0.30 <0.30   BNP (last 3 results)  Recent Labs  09/25/12 1333 05/06/13 1410 05/07/13 0500  PROBNP 4012.0* [id number]* 20527.0*     CBG: No results found for this basename: GLUCAP,  in the last 168 hours  Recent Results (from the past 240 hour(s))  MRSA PCR SCREENING     Status: None   Collection Time    05/06/13  6:29 PM      Result Value Range Status   MRSA by PCR NEGATIVE  NEGATIVE Final   Comment:            The GeneXpert MRSA Assay (FDA     approved for NASAL specimens     only),  is one component of a     comprehensive MRSA colonization     surveillance program. It is not     intended to diagnose MRSA     infection nor to guide or     monitor treatment for     MRSA infections.     Studies: Ct Head Wo Contrast  05/06/2013   *RADIOLOGY REPORT*  Clinical Data: Headache  CT HEAD WITHOUT CONTRAST  Technique:  Contiguous axial images were obtained from the base of the  skull through the vertex without contrast.  Comparison: 09/25/2012  Findings: No evidence for an acute hemorrhage.  No hydrocephalus, midline shift, or abnormal extra-axial fluid collection.  No CT evidence for acute ischemia.  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  A right sided aneurysm clip is seen again.  The right frontal ventriculostomy shunt catheter is again noted with stable tip position.  Inferior left cerebellar infarct again noted.  Postsurgical change noted in the right frontotemporal skull.  IMPRESSION: Stable.  No new or acute interval findings.   Original Report Authenticated By: Misty Stanley, M.D.   US Renal  05/06/2013   *RADIOLOGY REPORT*  Clinical Data: Acute renal failure superimposed on chronic kidney disease.  RENAL/URINARY TRACT ULTRASOUND COMPLETE  Comparison:  None.  Findings:  Right Kidney:  Right kidney shows diffusely increased parenchymal echogenicity.  It measures 9.8 cm in length.  No right renal mass or stone or hydronephrosis.  Left Kidney:  Left kidney is increased in echogenicity diffusely. There are to lower pole cyst seen laterally, one measuring 9 mm and the other measuring 14 mm in size.  The left kidney measures 9.9 cm in length.  Bladder:  The bladder is unremarkable.  IMPRESSION: Increased renal parenchymal echogenicity bilaterally is consistent with medical renal disease.  No hydronephrosis.  Two small left renal cysts.   Original Report Authenticated By: Lajean Manes, M.D.   Dg Chest Port 1 View  05/07/2013   *RADIOLOGY REPORT*  Clinical Data: CHF   PORTABLE CHEST - 1 VIEW  Comparison: Prior radiograph from 05/06/2013  Findings:  VP shunt catheter tubing is again seen coursing over the right anterior chest.  Cardiomegaly is not significantly changed as compared to the prior examination.  The lungs are mildly hypoinflated.  Diffuse prominence of the interstitial markings with a few scattered Kerley lines are not significantly changed as compared to the prior study. There is question of a left pleural effusion.  No pneumothorax.  No soft tissues and osseous structures are unchanged.  IMPRESSION: Stable cardiomegaly and mild interstitial pulmonary edema, consistent with CHF.   Original Report Authenticated By: Jeannine Boga, M.D.   Dg Chest Port 1 View  05/06/2013   *RADIOLOGY REPORT*  Clinical Data: Shortness of breath.  Current history of hypertension.  Current smoker.  PORTABLE CHEST - 1 VIEW 05/06/2013 1419 hours:  Comparison: Portable chest x-ray 04/30/2012 dating back to 03/05/2012.  Findings: Cardiac silhouette moderately enlarged but stable. Pulmonary venous hypertension and scattered Kerley B lines consistent with minimal interstitial pulmonary edema.  No visible pleural effusions.  No confluent airspace consolidation. Ventriculoperitoneal shunt catheter overlies the right neck, chest, and upper abdomen.  IMPRESSION: Stable moderate cardiomegaly.  Minimal interstitial pulmonary edema consistent with CHF.   Original Report Authenticated By: Evangeline Dakin, M.D.    Scheduled Meds: . amLODipine  10 mg Oral Daily  . [START ON 05/16/2013] cloNIDine  0.2 mg Transdermal Weekly  . hydrALAZINE  50 mg Oral TID  . labetalol  500 mg Oral BID  . nicotine  21 mg Transdermal Daily  . sodium chloride  3 mL Intravenous Q12H   Continuous Infusions: . sodium chloride 50 mL/hr at 05/10/13 1549    Active Problems:   HTN (hypertension), malignant   CHF (congestive heart failure)   CAD (coronary artery disease)   CKD (chronic kidney disease), stage  IV   Hypertensive heart disease   Acute-on-chronic renal failure    Time spent: 40 minutes   Mairin Lindsley  [facility] Pager [phone]. If 8PM-8AM, please contact night-coverage at [email or web address], password [facility] 05/11/2013, 7:44 AM  LOS: 5 days

## 2013-05-12 LAB — BASIC METABOLIC PANEL
Chloride: 104 mEq/L (ref 96–112)
GFR calc Af Amer: 10 mL/min — ABNORMAL LOW (ref >90)
GFR calc non Af Amer: 9 mL/min — ABNORMAL LOW (ref >90)
Potassium: 3.5 mEq/L (ref 3.5–5.1)
Sodium: 140 mEq/L (ref 135–145)

## 2013-05-12 MED ORDER — LABETALOL HCL 200 MG PO TABS
400.0000 mg | ORAL_TABLET | Freq: Three times a day (TID) | ORAL | Status: DC
  Administered 2013-05-12 – 2013-05-14 (×7): 400 mg via ORAL
  Filled 2013-05-12 (×9): qty 2

## 2013-05-12 MED ORDER — VANCOMYCIN HCL IN DEXTROSE 1-5 GM/200ML-% IV SOLN
1000.0000 mg | INTRAVENOUS | Status: AC
  Administered 2013-05-13: 1000 mg via INTRAVENOUS
  Filled 2013-05-12: qty 200

## 2013-05-12 MED ORDER — KIDNEY FAILURE BOOK
Freq: Once | Status: AC
  Administered 2013-05-12: 11:00:00
  Filled 2013-05-12: qty 1

## 2013-05-12 MED ORDER — CALCITRIOL 0.25 MCG PO CAPS
0.2500 ug | ORAL_CAPSULE | ORAL | Status: DC
  Administered 2013-05-13: 0.25 ug via ORAL
  Filled 2013-05-12: qty 1

## 2013-05-12 NOTE — Progress Notes (Signed)
Report given to receiving RN. No questions or concerns at this time. Patient is stable and appears in no acute distress or discomfort.

## 2013-05-12 NOTE — Progress Notes (Signed)
Utilization Review Completed.   Thailan Sava, RN, BSN Nurse Case Manager  [phone]  

## 2013-05-12 NOTE — Progress Notes (Signed)
Patient ID: [name], female   DOB: 1960-11-07, 52 y.o.   MRN: [id number] Plan right basilic vein transposition tomorrow by Dr. Kellie Simmering or early   Okay to [place] home following surgery on Friday

## 2013-05-12 NOTE — Progress Notes (Signed)
Physical Therapy Treatment Patient Details Name: [name] MRN: [id number] DOB: Dec 04, 1960 Today's Date: 05/12/2013 Time: [id number] PT Time Calculation (min): 17 min  PT Assessment / Plan / Recommendation  History of Present Illness 52 yo female admitted [facility] with accelerated hypertension [facility] She also has hx of CHF, CAD CRF and multilpe spontaneous intracerebral bleeds.   Clinical Impression Attempted to give pt HEP and began doing mini squats with pt.  Pt stated "I'm not going to do any of these exercises at home.  I have to run after all my grandkids at home.  I don't have time for theses exercises."  Pt did not want to do anymore exercises therefore finished treatment session.  Educated pt on the importance of continue ambulating 3-4 x/day.  No further PT needs at this time.  PT will sign off.    Follow Up Recommendations  No PT follow up     Equipment Recommendations  None recommended by PT    Progress towards PT Goals Progress towards PT goals: Goals met/education completed, patient discharged from PT  Plan Other (comment) (discharged pt from PT services)    Precautions / Restrictions Precautions Precautions: None Restrictions Weight Bearing Restrictions: No   Pertinent Vitals/Pain No c/o pain    Mobility  Bed Mobility Bed Mobility: Supine to Sit;Sit to Supine Supine to Sit: 7: Independent Sit to Supine: 7: Independent Transfers Transfers: Sit to Stand;Stand to Sit Sit to Stand: 7: Independent Stand to Sit: 7: Independent Ambulation/Gait Ambulation/Gait Assistance: 6: Modified independent (Device/Increase time) Ambulation Distance (Feet): 200 Feet Assistive device: None Ambulation/Gait Assistance Details: decrease speed and with staggered gait at times.  Pt stated "I do that sometimes when I just woke up." Gait Pattern: Decreased step length - right;Decreased step length - left;Step-through pattern;Narrow base of  support;Scissoring;Shuffle Gait velocity: decreased General Gait Details: Pt has impairments from longstanding increased tone especially in left leg. Pt self selects a slower pace and is not comfortable increasing speed. "I might fall" Stairs: No    Exercises General Exercises - Lower Extremity Mini-Sqauts: Strengthening;5 reps   PT Diagnosis:    PT Problem List:   PT Treatment Interventions:     PT Goals (current goals can now be found in the care plan section) Acute Rehab PT Goals Patient Stated Goal: to go home with her family  Visit Information  Last PT Received On: 05/12/13 Assistance Needed: +1 History of Present Illness: 52 yo female admitted to [facility] 7/18 with accelerated hypertension [facility] She also has hx of CHF, CAD CRF and multilpe spontaneous intracerebral bleeds.    Subjective Data  Subjective: "I'm not going to do any exercises at home." Patient Stated Goal: to go home with her family   Cognition  Cognition Arousal/Alertness: Awake/alert Behavior During Therapy: WFL for tasks assessed/performed Overall Cognitive Status: Within Functional Limits for tasks assessed    Balance     End of Session PT - End of Session Equipment Utilized During Treatment: Gait belt Activity Tolerance: Patient tolerated treatment well Patient left: in chair;with call bell/phone within reach Nurse Communication: Mobility status   GP     Korah Hufstedler 05/12/2013, 10:10 AM  [name], [place] DPT [phone]

## 2013-05-12 NOTE — Progress Notes (Signed)
[facility] PROGRESS NOTE  [name] [id number] DOB: 27-Jan-1961 DOA: 05/06/2013 PCP: No primary provider on file.  Assessment/Plan: Active Problems:   HTN (hypertension), malignant   CHF (congestive heart failure)   CAD (coronary artery disease)   CKD (chronic kidney disease), stage IV   Hypertensive heart disease   Acute-on-chronic renal failure    Assessment/Plan:  1. HTN (hypertension), malignant: Patient's hypertension improved from admission (298/160-230/102), but still elevated. Inc Labetalol to 500 mg BID and continue Norvasc 10 mg daily. Inc clonidine patch 0.2 mg. increased hydralazine to 100 mg by mouth 3 times a day yesterday .  2. CHF (congestive heart failure): Patient states had a dry weight of 165 pounds Lasix per nephrology, IV fluids discontinued    3. CAD (coronary artery disease): Patient has known CAD, s/p NSTEMI 2009, s/p DES. 12-Lead EKG is devoid of acute ischemic changes. No clinical evidence of ACS at this time.   4. CKD (chronic kidney disease), stage IV: Baseline creatinine is 2.21-2.56 in 09/2012. At presentation, creatinine was 3.78, and has risen to 5.35 most likely to over diuresis. Dr Jeneen Rinks Deterding. Upper extremity vein mapping to be done (nephrology) follow along and feels patient may be close to needing HD. Consulted vascular surgery, recommend right basilic vein transposition on Friday  5. Tobacco Abuse: Patient admittedly, smokes a pack of cigarettes per day. Counseled appropriately, and commenced on Nicoderm CQ patch. Counseled patient that considering her MMP smoking is an absolute, indicator  6. 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 According to patient she had been compliant with her medication, but ran out about 3 days a go, and was unable to get refill, for insurance reasons. On 05/04/13, she developed a frontal headache, which has persisted, unassociated with visual obscurations. Since 05/05/13, she has had recurrent vomiting, up to 4-5 times daily, including today. Although she is intermittently SOB, this has been going on for about 6 months, and is no worse at this time. She denies chest pain, cough, fever or chills. In the ED, BP was 298/160-230/102. Head CT scan was devoid of acute findings, and CXR revealed stable moderate cardiomegaly. Minimal interstitial pulmonary edema consistent with CHF. [place] [address]. TODAY per patient her dry weight is approximately 165Lbs current weight 143 Lbs rising creatinine at 5.53  Procedure  cardiac echo on 05/07/2013   - Left ventricle: moderate LVH; LVEF= 60% to 65%.  Features are consistent with a pseudonormal left ventricular filling pattern, with concomitant abnormal relaxation and increased filling pressure (grade 2 diastolic dysfunction). Doppler parameters are consistent with elevated ventricular end-diastolic filling pressure. - Tricuspid valve: Mild regurgitation. - Pericardium, extracardiac: A small pericardial effusion is noted, with no hemodynamic sequelae.        HPI/Subjective: Denies any chest pain shortness of breath blood pressure improved overnight  Objective: Filed Vitals:   05/11/13 1518 05/11/13 1744 05/11/13 2024 05/12/13 0520  BP: 152/71 161/90 180/79 167/82  Pulse: 65  66 6  Temp: 97.7 F (36.5 C)  97.7 F (36.5 C) 97.8 F (36.6 C)  TempSrc: Oral  Oral Oral  Resp: 17  16  16  Height:      Weight:    62.959 kg (138 lb 12.8 oz)  SpO2: 99%  99% 99%    Intake/Output Summary (Last 24 hours) at 05/12/13 0804 Last data filed at 05/12/13  0747  Gross per 24 hour  Intake 371.67 ml  Output   1700 ml  Net -1328.33 ml    Exam:  HENT:  Head: Atraumatic.  Nose: Nose normal.  Mouth/Throat: Oropharynx is clear and moist.  Eyes: Conjunctivae are normal. Pupils are equal, round, and reactive to light. No scleral icterus.  Neck: Neck supple. No tracheal deviation present.  Cardiovascular: Normal rate, regular rhythm, normal heart sounds and intact distal pulses.  Pulmonary/Chest: Effort normal and breath sounds normal. No respiratory distress.  Abdominal: Soft. Normal appearance and bowel sounds are normal. She exhibits no distension. There is no tenderness.  Musculoskeletal: She exhibits no edema and no tenderness.  Neurological: She is alert. No cranial nerve deficit.    Data Reviewed: Basic Metabolic Panel:  Recent Labs Lab 05/07/13 0500 05/08/13 0458 05/09/13 0700 05/10/13 0430 05/12/13 0520  NA 141  141 141 137 139 140  K 3.6  3.6 3.5 3.3* 3.6 3.5  CL 108  108 105 102 102 104  CO2 18*  18* 21 19 22 24   GLUCOSE 103*  104* 87 88 94 86  BUN 34*  34* 35* 38* 37* 34*  CREATININE 4.03*  4.07* 4.59* 5.35* 5.53* 5.19*  CALCIUM 9.2  9.2 9.8 9.8 9.9 10.1  MG  --  1.9  --   --   --   PHOS 4.2 4.5 6.3* 6.3*  --     Liver Function Tests:  Recent Labs Lab 05/06/13 1410 05/07/13 0500 05/08/13 0458 05/09/13 0700 05/10/13 0430  AST 14 11 12   --   --   ALT 11 9 9   --   --   ALKPHOS 89 76 83  --   --   BILITOT 0.5 0.3 0.3  --   --   PROT 7.8 7.0 7.2  --   --   ALBUMIN 4.0 3.5  3.5 3.8 3.6 3.8   No results found for this basename: LIPASE, AMYLASE,  in the last 168 hours No results found for this basename: AMMONIA,  in the last 168 hours  CBC:  Recent Labs Lab 05/06/13 1410 05/07/13 0500 05/08/13 0458 05/09/13 0700  WBC 6.0 4.6 7.1 4.2  NEUTROABS 4.4  --   --   --   HGB 12.5 11.0* 11.5* 12.1  HCT 37.4 32.9* 36.1 34.4*  MCV 82.9 84.1 84.1 82.7  PLT 220 208 225 235    Cardiac  Enzymes:  Recent Labs Lab 05/06/13 1410 05/06/13 1710 05/07/13 0006 05/07/13 0447  TROPONINI <0.30 <0.30 <0.30 <0.30   BNP (last 3 results)  Recent Labs  09/25/12 1333 05/06/13 1410 05/07/13 0500  PROBNP 4012.0* [id number]* 20527.0*     CBG: No results found for this basename: GLUCAP,  in the last 168 hours  Recent Results (from the past 240 hour(s))  MRSA PCR SCREENING     Status: None   Collection Time    05/06/13  6:29 PM      Result Value Range Status   MRSA by PCR NEGATIVE  NEGATIVE Final   Comment:            The GeneXpert MRSA Assay (FDA     approved for NASAL specimens     only), is one component of a  comprehensive MRSA colonization     surveillance program. It is not     intended to diagnose MRSA     infection nor to guide or     monitor treatment for     MRSA infections.     Studies: Ct Head Wo Contrast  05/06/2013   *RADIOLOGY REPORT*  Clinical Data: Headache  CT HEAD WITHOUT CONTRAST  Technique:  Contiguous axial images were obtained from the base of the skull through the vertex without contrast.  Comparison: 09/25/2012  Findings: No evidence for an acute hemorrhage.  No hydrocephalus, midline shift, or abnormal extra-axial fluid collection.  No CT evidence for acute ischemia.  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  A right sided aneurysm clip is seen again.  The right frontal ventriculostomy shunt catheter is again noted with stable tip position.  Inferior left cerebellar infarct again noted.  Postsurgical change noted in the right frontotemporal skull.  IMPRESSION: Stable.  No new or acute interval findings.   Original Report Authenticated By: Misty Stanley, M.D.   US Renal  05/06/2013   *RADIOLOGY REPORT*  Clinical Data: Acute renal failure superimposed on chronic kidney disease.  RENAL/URINARY TRACT ULTRASOUND COMPLETE  Comparison:  None.  Findings:  Right Kidney:  Right kidney shows diffusely increased parenchymal echogenicity.   It measures 9.8 cm in length.  No right renal mass or stone or hydronephrosis.  Left Kidney:  Left kidney is increased in echogenicity diffusely. There are to lower pole cyst seen laterally, one measuring 9 mm and the other measuring 14 mm in size.  The left kidney measures 9.9 cm in length.  Bladder:  The bladder is unremarkable.  IMPRESSION: Increased renal parenchymal echogenicity bilaterally is consistent with medical renal disease.  No hydronephrosis.  Two small left renal cysts.   Original Report Authenticated By: Lajean Manes, M.D.   Dg Chest Port 1 View  05/07/2013   *RADIOLOGY REPORT*  Clinical Data: CHF  PORTABLE CHEST - 1 VIEW  Comparison: Prior radiograph from 05/06/2013  Findings:  VP shunt catheter tubing is again seen coursing over the right anterior chest.  Cardiomegaly is not significantly changed as compared to the prior examination.  The lungs are mildly hypoinflated.  Diffuse prominence of the interstitial markings with a few scattered Kerley lines are not significantly changed as compared to the prior study. There is question of a left pleural effusion.  No pneumothorax.  No soft tissues and osseous structures are unchanged.  IMPRESSION: Stable cardiomegaly and mild interstitial pulmonary edema, consistent with CHF.   Original Report Authenticated By: Jeannine Boga, M.D.   Dg Chest Port 1 View  05/06/2013   *RADIOLOGY REPORT*  Clinical Data: Shortness of breath.  Current history of hypertension.  Current smoker.  PORTABLE CHEST - 1 VIEW 05/06/2013 1419 hours:  Comparison: Portable chest x-ray 04/30/2012 dating back to 03/05/2012.  Findings: Cardiac silhouette moderately enlarged but stable. Pulmonary venous hypertension and scattered Kerley B lines consistent with minimal interstitial pulmonary edema.  No visible pleural effusions.  No confluent airspace consolidation. Ventriculoperitoneal shunt catheter overlies the right neck, chest, and upper abdomen.  IMPRESSION: Stable moderate  cardiomegaly.  Minimal interstitial pulmonary edema consistent with CHF.   Original Report Authenticated By: Evangeline Dakin, M.D.    Scheduled Meds: . amLODipine  10 mg Oral Daily  . [START ON 05/16/2013] cloNIDine  0.2 mg Transdermal Weekly  . furosemide  80 mg Oral BID  . hydrALAZINE  100 mg Oral TID  .  labetalol  500 mg Oral BID  . nicotine  21 mg Transdermal Daily  . sodium chloride  3 mL Intravenous Q12H   Continuous Infusions:   Active Problems:   HTN (hypertension), malignant   CHF (congestive heart failure)   CAD (coronary artery disease)   CKD (chronic kidney disease), stage IV   Hypertensive heart disease   Acute-on-chronic renal failure    Time spent: 40 minutes   [facility] Pager [phone]. If 8PM-8AM, please contact night-coverage at [email or web address], password [facility] 05/12/2013, 8:04 AM  LOS: 6 days

## 2013-05-12 NOTE — Progress Notes (Signed)
S:  No N/V  Eating well.  Still has not seen video tapes O:BP 167/82  Pulse 6  Temp(Src) 97.8 F (36.6 C) (Oral)  Resp 16  Ht 5\' 1"  (1.549 m)  Wt 62.959 kg (138 lb 12.8 oz)  BMI 26.24 kg/m2  SpO2 99%  Intake/Output Summary (Last 24 hours) at 05/12/13 0923 Last data filed at 05/12/13 0916  Gross per 24 hour  Intake 491.67 ml  Output   1700 ml  Net -1208.33 ml   Weight change: -0.441 kg (-15.6 oz) [id number] and alert CVS:RRR Resp:clear Abd:+ BS NTND Ext:no edema NEURO:CNI ox3 no asterixis   . amLODipine  10 mg Oral Daily  . [START ON 05/16/2013] cloNIDine  0.2 mg Transdermal Weekly  . furosemide  80 mg Oral BID  . hydrALAZINE  100 mg Oral TID  . labetalol  400 mg Oral TID  . nicotine  21 mg Transdermal Daily  . sodium chloride  3 mL Intravenous Q12H   No results found. BMET    Component Value Date/Time   NA 140 05/12/2013 0520   K 3.5 05/12/2013 0520   CL 104 05/12/2013 0520   CO2 24 05/12/2013 0520   GLUCOSE 86 05/12/2013 0520   BUN 34* 05/12/2013 0520   CREATININE 5.19* 05/12/2013 0520   CALCIUM 10.1 05/12/2013 0520   GFRNONAA 9* 05/12/2013 0520   GFRAA 10* 05/12/2013 0520   CBC    Component Value Date/Time   WBC 4.2 05/09/2013 0700   RBC 4.16 05/09/2013 0700   HGB 12.1 05/09/2013 0700   HCT 34.4* 05/09/2013 0700   PLT 235 05/09/2013 0700   MCV 82.7 05/09/2013 0700   MCH 29.1 05/09/2013 0700   MCHC 35.2 05/09/2013 0700   RDW 15.3 05/09/2013 0700   LYMPHSABS 1.2 05/06/2013 1410   MONOABS 0.3 05/06/2013 1410   EOSABS 0.1 05/06/2013 1410   BASOSABS 0.0 05/06/2013 1410     Assessment: 1. 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 HTN,  3. CAD 4. Mild hypokalemia, improved 5. Sec HPTH   Plan: 1.For AVF friday 2. Calcitriol q od 3. Show video tapes 4.  If Scr stable to improved tomorrow then possible DC after AVF placed   [name]

## 2013-05-13 ENCOUNTER — Inpatient Hospital Stay (HOSPITAL_COMMUNITY): Payer: No Typology Code available for payment source | Admitting: Anesthesiology

## 2013-05-13 ENCOUNTER — Encounter (HOSPITAL_COMMUNITY)
Admission: EM | Disposition: A | Discharge status: Home / Self Care | Payer: Self-pay | Source: Home / Self Care | Attending: Internal Medicine

## 2013-05-13 ENCOUNTER — Encounter (HOSPITAL_COMMUNITY): Payer: Self-pay | Admitting: Certified Registered"

## 2013-05-13 ENCOUNTER — Encounter (HOSPITAL_COMMUNITY): Payer: Self-pay | Admitting: Anesthesiology

## 2013-05-13 ENCOUNTER — Telehealth: Payer: Self-pay | Admitting: Vascular Surgery

## 2013-05-13 HISTORY — PX: BASCILIC VEIN TRANSPOSITION: SHX5742

## 2013-05-13 LAB — BASIC METABOLIC PANEL
CO2: 24 mEq/L (ref 19–32)
Glucose, Bld: 95 mg/dL (ref 70–99)
Potassium: 3.5 mEq/L (ref 3.5–5.1)
Sodium: 138 mEq/L (ref 135–145)

## 2013-05-13 LAB — PROTIME-INR: INR: 0.99 (ref 0.00–1.49)

## 2013-05-13 LAB — CBC
HCT: 34.6 % — ABNORMAL LOW (ref 36.0–46.0)
Hemoglobin: 12.1 g/dL (ref 12.0–15.0)
RDW: 15.3 % (ref 11.5–15.5)
WBC: 4.8 10*3/uL (ref 4.0–10.5)

## 2013-05-13 SURGERY — TRANSPOSITION, VEIN, BASILIC
Anesthesia: General | Site: Arm Upper | Laterality: Right | Wound class: Clean

## 2013-05-13 MED ORDER — CALCITRIOL 0.25 MCG PO CAPS: 0.2500 ug | ORAL_CAPSULE | ORAL | Status: DC

## 2013-05-13 MED ORDER — SODIUM CHLORIDE 0.9 % IV SOLN
INTRAVENOUS | Status: DC
  Administered 2013-05-13: 08:00:00 via INTRAVENOUS

## 2013-05-13 MED ORDER — SODIUM CHLORIDE 0.9 % IR SOLN
Status: DC | PRN
  Administered 2013-05-13: 09:00:00

## 2013-05-13 MED ORDER — MORPHINE SULFATE 2 MG/ML IJ SOLN: 1.0000 mg | INTRAMUSCULAR | Status: DC | PRN

## 2013-05-13 MED ORDER — PROMETHAZINE HCL 25 MG/ML IJ SOLN
6.2500 mg | INTRAMUSCULAR | Status: DC | PRN
  Filled 2013-05-13: qty 1

## 2013-05-13 MED ORDER — PROPOFOL 10 MG/ML IV BOLUS
INTRAVENOUS | Status: DC | PRN
  Administered 2013-05-13: 50 mg via INTRAVENOUS
  Administered 2013-05-13: 150 mg via INTRAVENOUS

## 2013-05-13 MED ORDER — 0.9 % SODIUM CHLORIDE (POUR BTL) OPTIME
TOPICAL | Status: DC | PRN
  Administered 2013-05-13: 1000 mL

## 2013-05-13 MED ORDER — NICOTINE 21 MG/24HR TD PT24: 1.0000 | MEDICATED_PATCH | Freq: Every day | TRANSDERMAL | Status: DC

## 2013-05-13 MED ORDER — CLONIDINE HCL 0.2 MG/24HR TD PTWK: 1.0000 | MEDICATED_PATCH | TRANSDERMAL | Status: DC

## 2013-05-13 MED ORDER — LIDOCAINE HCL (CARDIAC) 20 MG/ML IV SOLN
INTRAVENOUS | Status: DC | PRN
  Administered 2013-05-13: 80 mg via INTRAVENOUS

## 2013-05-13 MED ORDER — FENTANYL CITRATE 0.05 MG/ML IJ SOLN
25.0000 ug | INTRAMUSCULAR | Status: DC | PRN
  Administered 2013-05-13: 25 ug via INTRAVENOUS
  Administered 2013-05-13: 50 ug via INTRAVENOUS
  Administered 2013-05-13: 25 ug via INTRAVENOUS

## 2013-05-13 MED ORDER — PHENYLEPHRINE HCL 10 MG/ML IJ SOLN
INTRAMUSCULAR | Status: DC | PRN
  Administered 2013-05-13 (×2): 120 ug via INTRAVENOUS
  Administered 2013-05-13 (×2): 80 ug via INTRAVENOUS

## 2013-05-13 MED ORDER — HYDRALAZINE HCL 100 MG PO TABS: 100.0000 mg | ORAL_TABLET | Freq: Three times a day (TID) | ORAL | Status: DC

## 2013-05-13 MED ORDER — FENTANYL CITRATE 0.05 MG/ML IJ SOLN
INTRAMUSCULAR | Status: AC
  Filled 2013-05-13: qty 2

## 2013-05-13 MED ORDER — FUROSEMIDE 80 MG PO TABS: 80.0000 mg | ORAL_TABLET | Freq: Two times a day (BID) | ORAL | Status: DC

## 2013-05-13 MED ORDER — AMLODIPINE BESYLATE 10 MG PO TABS: 10.0000 mg | ORAL_TABLET | Freq: Every day | ORAL | Status: DC

## 2013-05-13 MED ORDER — EPHEDRINE SULFATE 50 MG/ML IJ SOLN
INTRAMUSCULAR | Status: DC | PRN
  Administered 2013-05-13 (×2): 10 mg via INTRAVENOUS

## 2013-05-13 MED ORDER — FENTANYL CITRATE 0.05 MG/ML IJ SOLN
INTRAMUSCULAR | Status: DC | PRN
  Administered 2013-05-13 (×5): 25 ug via INTRAVENOUS

## 2013-05-13 MED ORDER — ONDANSETRON HCL 4 MG/2ML IJ SOLN
INTRAMUSCULAR | Status: DC | PRN
  Administered 2013-05-13: 4 mg via INTRAVENOUS

## 2013-05-13 SURGICAL SUPPLY — 33 items
BENZOIN TINCTURE PRP APPL 2/3 (GAUZE/BANDAGES/DRESSINGS) ×2 IMPLANT
CANISTER SUCTION 2500CC (MISCELLANEOUS) ×2 IMPLANT
CLIP LIGATING EXTRA MED SLVR (CLIP) ×2 IMPLANT
CLIP LIGATING EXTRA SM BLUE (MISCELLANEOUS) ×4 IMPLANT
CLOTH BEACON ORANGE TIMEOUT ST (SAFETY) ×2 IMPLANT
CLSR STERI-STRIP ANTIMIC 1/2X4 (GAUZE/BANDAGES/DRESSINGS) ×2 IMPLANT
COVER PROBE W GEL 5X96 (DRAPES) ×2 IMPLANT
COVER SURGICAL LIGHT HANDLE (MISCELLANEOUS) ×2 IMPLANT
DECANTER SPIKE VIAL GLASS SM (MISCELLANEOUS) ×2 IMPLANT
ELECT REM PT RETURN 9FT ADLT (ELECTROSURGICAL) ×2
ELECTRODE REM PT RTRN 9FT ADLT (ELECTROSURGICAL) ×1 IMPLANT
GEL ULTRASOUND 20GR AQUASONIC (MISCELLANEOUS) IMPLANT
GLOVE BIOGEL PI IND STRL 6.5 (GLOVE) ×3 IMPLANT
GLOVE BIOGEL PI INDICATOR 6.5 (GLOVE) ×3
GLOVE ECLIPSE 6.5 STRL STRAW (GLOVE) ×6 IMPLANT
GLOVE SS BIOGEL STRL SZ 7.5 (GLOVE) ×1 IMPLANT
GLOVE SUPERSENSE BIOGEL SZ 7.5 (GLOVE) ×1
GOWN STRL NON-REIN LRG LVL3 (GOWN DISPOSABLE) ×6 IMPLANT
KIT BASIN OR (CUSTOM PROCEDURE TRAY) ×2 IMPLANT
KIT ROOM TURNOVER OR (KITS) ×2 IMPLANT
NS IRRIG 1000ML POUR BTL (IV SOLUTION) ×2 IMPLANT
PACK CV ACCESS (CUSTOM PROCEDURE TRAY) ×2 IMPLANT
PAD ARMBOARD 7.5X6 YLW CONV (MISCELLANEOUS) ×4 IMPLANT
SPONGE GAUZE 4X4 12PLY (GAUZE/BANDAGES/DRESSINGS) ×2 IMPLANT
STRIP CLOSURE SKIN 1/2X4 (GAUZE/BANDAGES/DRESSINGS) ×4 IMPLANT
SUT PROLENE 6 0 CC (SUTURE) ×4 IMPLANT
SUT SILK 2 0 SH (SUTURE) ×2 IMPLANT
SUT VIC AB 3-0 SH 27 (SUTURE) ×3
SUT VIC AB 3-0 SH 27X BRD (SUTURE) ×3 IMPLANT
TOWEL OR 17X24 6PK STRL BLUE (TOWEL DISPOSABLE) ×2 IMPLANT
TOWEL OR 17X26 10 PK STRL BLUE (TOWEL DISPOSABLE) ×2 IMPLANT
UNDERPAD 30X30 INCONTINENT (UNDERPADS AND DIAPERS) ×2 IMPLANT
WATER STERILE IRR 1000ML POUR (IV SOLUTION) ×2 IMPLANT

## 2013-05-13 NOTE — Transfer of Care (Signed)
Immediate Anesthesia Transfer of Care Note  Patient: [name]  Procedure(s) Performed: Procedure(s): BASCILIC VEIN TRANSPOSITION (Right)  Patient Location: PACU  Anesthesia Type:General  Level of Consciousness: sedated and responds to stimulation  Airway & Oxygen Therapy: Patient Spontanous Breathing and Patient connected to nasal cannula oxygen  Post-op Assessment: Report given to PACU RN and Post -op Vital signs reviewed and stable  Post vital signs: Reviewed and stable  Complications: No apparent anesthesia complications

## 2013-05-13 NOTE — H&P (View-Only) (Signed)
[facility] Consult  Reason for Consult:  CKD in need of HD access Referring Physician:  Mercy Moore  PB:[id number]  History of Present Illness: This is a 52 y.o. female who was admitted several days ago with chest pain per the pt.  She states that she did have a headache, but no visual changes.  She was also evaluated for malignant hypertension by cardiology as she had run out of her antihypertensive medication.  Her systolic BP on arrival was 298.  She   She does have acute on chronic kidney disease.  She has had increasing SOB.  She states that she still smokes about 3 cigarettes per day.  VVS is consulted for permanent HD access.   Past Medical History  Diagnosis Date  . HTN (hypertension)     resistant  . Hypercholesterolemia   . Tobacco abuse     resumed smoking half a pack a day. She was a smoker in the past and states she was abl eto stop in the past using nicotine patches  . CAD (coronary artery disease)     Pt has St elevation MI in Feb 2009. Left hear tcath at tha ttime showed a 70% distal LAD lesion that was hazy consistent with a plaque rupture. She had a 50% distal circumflex stenosis an a 95% distal RCA stenosis. She did havve drug eluting stent placed in her distal RCA  . Chronic kidney disease     most recent creatinine 1.4 in 3/10  . Intracranial hemorrhage, spontaneous intraparenchymal, idiopathic, remote, resolved   . Diastolic heart failure     pt most recent echo was in Feb 2009 w an EF of 60% and severe left ventricular hypertrophy. The pt did have evidence of diastolic dysfunction. The RV appeared normal  . Subarachnoid hemorrhage     03/2012   Past Surgical History  Procedure Laterality Date  . Ventriculoperitoneal shunt  03/27/2012    Procedure: SHUNT INSERTION VENTRICULAR-PERITONEAL;  Surgeon: Winfield Cunas, MD;  Location: [place] [facility];  Service: Neurosurgery;  Laterality: N/A;  Ventricular-Peritoneal Shunt Insertion  . Cardiac catheterization    .  Coronary angioplasty    . Abdominal hysterectomy      Allergies  Allergen Reactions  . Ampicillin Other (See Comments)    unknown  . Penicillins Other (See Comments)    unknown    Prior to Admission medications   Medication Sig Start Date End Date Taking? Authorizing Provider  hydrALAZINE (APRESOLINE) 50 MG tablet Take 1 tablet (50 mg total) by mouth 3 (three) times daily. 09/28/12  Yes Ripudeep Krystal Eaton, MD  labetalol (NORMODYNE) 200 MG tablet Take 1 tablet (200 mg total) by mouth 3 (three) times daily. 09/28/12  Yes Ripudeep Krystal Eaton, MD  nitroGLYCERIN (NITROSTAT) 0.4 MG SL tablet Place 0.4 mg under the tongue every 5 (five) minutes as needed for chest pain.  04/27/12  Yes Sorin June Leap, MD    History   Social History  . Marital Status: Single    Spouse Name: N/A    Number of Children: N/A  . Years of Education: N/A   Occupational History  . Not on file.   Social History Main Topics  . Smoking status: Current Every Day Smoker -- 0.20 packs/day for 35 years    Types: Cigarettes  . Smokeless tobacco: Never Used     Comment: 1/2 ppd   . Alcohol Use: No  . Drug Use: No  . Sexually Active: Not on file  Other Topics Concern  . Not on file   Social History Narrative   Lives by herself but helps care for her 8 grandchildren.     Hx of blood clot - mother  Family History  Problem Relation Age of Onset  . Coronary artery disease Other     premature in 1st degree relatives    ROS: [x]  Positive   [ ]  Negative   [ ]  All sytems reviewed and are negative  Cardiovascular: [x]  chest pain/pressure-hx CAD and s/p NSTEMI '09 [x]  HTN []  palpitations []  SOB lying flat []  DOE []  pain in legs while walking []  pain in feet when lying flat []  hx of DVT []  hx of phlebitis []  swelling in legs []  varicose veins  Pulmonary: []  productive cough []  asthma []  wheezing  Neurologic: [x]  headache [x]  hx intracerebral hemorrhage [x]  hx of SAH 2003 (required V-P shunt) []  weakness in  []  arms []  legs []  numbness in []  arms []  legs [] difficulty speaking or slurred speech []  temporary loss of vision in one eye []  dizziness  Hematologic: []  bleeding problems []  problems with blood clotting easily  Endocrine:   []  diabetes []  thyroid disease  GI [x]  vomiting []  vomiting blood []  blood in stool  GU: []  burning with urination []  blood in urine  Psychiatric: []  hx of major depression  Integumentary: []  rashes []  ulcers  Constitutional: []  fever []  chills   Physical Examination  Filed Vitals:   05/11/13 0929  BP:   Pulse: 64  Temp:   Resp:    Body mass index is 26.42 kg/(m^2).  General:  WDWN in NAD Gait: Not observed HENT: WNL, normocephalic Eyes: Pupils equal Pulmonary: normal non-labored breathing , without Rales, rhonchi,  wheezing Cardiac: RRR, without  Murmurs, rubs or gallops; without carotid bruits Abdomen: soft, NT, no masses Skin: without rashes, without ulcers  Vascular Exam/Pulses:+ palpable ulnar/radial pulses bilaterally; + palpable bilateral femoral and pedal pulses bilaterally Extremities: without ischemic changes, without Gangrene , without cellulitis; without open wounds;  Musculoskeletal: no muscle wasting or atrophy  Neurologic: A&O X 3; Appropriate Affect ; SENSATION: normal; MOTOR FUNCTION:  moving all extremities equally. Speech is fluent/normal   CBC    Component Value Date/Time   WBC 4.2 05/09/2013 0700   RBC 4.16 05/09/2013 0700   HGB 12.1 05/09/2013 0700   HCT 34.4* 05/09/2013 0700   PLT 235 05/09/2013 0700   MCV 82.7 05/09/2013 0700   MCH 29.1 05/09/2013 0700   MCHC 35.2 05/09/2013 0700   RDW 15.3 05/09/2013 0700   LYMPHSABS 1.2 05/06/2013 1410   MONOABS 0.3 05/06/2013 1410   EOSABS 0.1 05/06/2013 1410   BASOSABS 0.0 05/06/2013 1410    BMET    Component Value Date/Time   NA 139 05/10/2013 0430   K 3.6 05/10/2013 0430   CL 102 05/10/2013 0430   CO2 22 05/10/2013 0430   GLUCOSE 94 05/10/2013 0430   BUN 37*  05/10/2013 0430   CREATININE 5.53* 05/10/2013 0430   CALCIUM 9.9 05/10/2013 0430   GFRNONAA 8* 05/10/2013 0430   GFRAA 9* 05/10/2013 0430     Non-Invasive Vascular Imaging:  Vein mapping [id number] Cephalic - right Segment  Diameter  Depth  Comment   1. Axilla  2.58mm  mm    2. Mid upper arm  1.61mm  mm    3. Above AC  1.57mm  mm    4. In [facility]  4.61mm  mm  thrombus   5. Below [facility]  1.7mm  mm  Thrombus, branch   6. Mid forearm  1.80mm  mm  thrombus   7. Wrist  mm  mm     mm  mm     mm  mm     mm  mm    Basilic - right  Segment  Diameter  Depth  Comment   1. Axilla  mm  mm    2. Mid upper arm  4.72mm  9.74mm  origin   3. Above AC  3.66mm  11.26mm    4. In [facility]  3.51mm  8.14mm  branch   5. Below AC  1.66mm  4.58mm    6. Mid forearm  mm  mm    7. Wrist  mm  mm     mm  mm     mm  mm     mm  mm     Cephalic - left Segment  Diameter  Depth  Comment   1. Axilla  1.22m  mm    2. Mid upper arm  1.62mm  mm    3. Above AC  2.68mm  mm  thrombus   4. In AC  2.21mm  mm  thrombus   5. Below AC  1.2mm  mm    6. Mid forearm  mm  mm    7. Wrist  mm  mm     mm  mm     mm  mm     mm  mm    Basilic - left Segment  Diameter  Depth  Comment   1. Axilla  mm  mm    2. Mid upper arm  2.29mm  15.46mm  origin   3. Above AC  2.87mm  12.56mm    4. In AC  1.88mm  11.58mm    5. Below AC  mm  mm    6. Mid forearm  mm  mm    7. Wrist  mm  mm        ASSESSMENT/PLAN: This is a 52 y.o. female with malignant HTN and AKI on CKD who is in need of permanent HD access.  Pt is left handed.  -bilateral cephalic veins are not adequate for access -her right basilic vein is adequate for access -will restrict the right arm and have them move the IV to the left arm.. -possible to schedule for Friday by Dr. Kellie Simmering.   Leontine Locket, PA-C [facility] [phone]  Patient examined and I agree with the above assessment Cephalic veins are inadequate or thrombosed bilaterally Left basilic vein is  too small Right basilic vein is borderline but in my opinion were of attempting a basilic vein transposition  Discussed this with patient she is agreeable to the right basilic vein transposition on Friday Also discussed with her that this may not mature into satisfactory access because of size and this is the case she will need an AV graft in her left upper extremity

## 2013-05-13 NOTE — Progress Notes (Signed)
Noted CM referral for medication needs.  Pt. States she is trying to obtain Medicaid.  She completed the application about 2weeks ago.  Reminded pt. That it takes at leas 30-45days for approval.   Pt. Does have insurance, therefore NCM is unable to assist pt. With medication assistance.  Pt. To dc home today. Llana Aliment, RN, BSN NCM [phone]

## 2013-05-13 NOTE — Anesthesia Procedure Notes (Signed)
Procedure Name: LMA Insertion Date/Time: 05/13/2013 8:37 AM Performed by: Julian Reil Pre-anesthesia Checklist: Patient identified, Emergency Drugs available, Suction available and Patient being monitored Patient Re-evaluated:Patient Re-evaluated prior to inductionOxygen Delivery Method: Circle system utilized Preoxygenation: Pre-oxygenation with 100% oxygen Intubation Type: IV induction Ventilation: Mask ventilation without difficulty LMA: LMA inserted LMA Size: 4.0 Tube type: Oral Number of attempts: 1 Placement Confirmation: positive ETCO2 and breath sounds checked- equal and bilateral Tube secured with: Tape Dental Injury: Teeth and Oropharynx as per pre-operative assessment

## 2013-05-13 NOTE — Telephone Encounter (Signed)
Message copied by Georgiann Mccoy on Fri May 13, 2013  2:34 PM ------      Message from: Richrd Prime      Created: Fri May 13, 2013  9:42 AM       6 weeks BVT - Early ------

## 2013-05-13 NOTE — Progress Notes (Addendum)
MD notified that patient is seeing people and and talking to people that are not here. Patient is unaware of what day it is and also believes to be on vacation and not in the hospital. MD stated to do not give her anymore pain medication,ok to eave IV access out, and cancel discharge. Will continue to monitor patient for further changes in condition.

## 2013-05-13 NOTE — Preoperative (Signed)
Beta Blockers   Reason not to administer Beta Blockers:Not Applicable 

## 2013-05-13 NOTE — Progress Notes (Signed)
MD notified to ask if ok to discharge patient now. MD stated to keep patient for a couple more hours due to vomiting and complaints of pain. Will continue to monitor patient for further changes.

## 2013-05-13 NOTE — Progress Notes (Signed)
S: A little groggy after surgery for AVF O:BP 146/70  Pulse 59  Temp(Src) 97.4 F (36.3 C) (Oral)  Resp 16  Ht 5\' 1"  (1.549 m)  Wt 62.506 kg (137 lb 12.8 oz)  BMI 26.05 kg/m2  SpO2 98%  Intake/Output Summary (Last 24 hours) at 05/13/13 1325 Last data filed at 05/13/13 1247  Gross per 24 hour  Intake    598 ml  Output    925 ml  Net   -327 ml   Weight change: -0.454 kg (-1 lb) [id number] and alert CVS:RRR Resp:clear Abd:+ BS NTND Ext:no edema  RUA AVF + bruit NEURO:CNI ox3 no asterixis   . amLODipine  10 mg Oral Daily  . calcitRIOL  0.25 mcg Oral Q M,W,F  . [START ON 05/16/2013] cloNIDine  0.2 mg Transdermal Weekly  . fentaNYL      . furosemide  80 mg Oral BID  . hydrALAZINE  100 mg Oral TID  . labetalol  400 mg Oral TID  . nicotine  21 mg Transdermal Daily  . sodium chloride  3 mL Intravenous Q12H   No results found. BMET    Component Value Date/Time   NA 138 05/13/2013 0547   K 3.5 05/13/2013 0547   CL 102 05/13/2013 0547   CO2 24 05/13/2013 0547   GLUCOSE 95 05/13/2013 0547   BUN 37* 05/13/2013 0547   CREATININE 5.18* 05/13/2013 0547   CALCIUM 10.2 05/13/2013 0547   GFRNONAA 9* 05/13/2013 0547   GFRAA 10* 05/13/2013 0547   CBC    Component Value Date/Time   WBC 4.8 05/13/2013 0547   RBC 4.16 05/13/2013 0547   HGB 12.1 05/13/2013 0547   HCT 34.6* 05/13/2013 0547   PLT 252 05/13/2013 0547   MCV 83.2 05/13/2013 0547   MCH 29.1 05/13/2013 0547   MCHC 35.0 05/13/2013 0547   RDW 15.3 05/13/2013 0547   LYMPHSABS 1.2 05/06/2013 1410   MONOABS 0.3 05/06/2013 1410   EOSABS 0.1 05/06/2013 1410   BASOSABS 0.0 05/06/2013 1410     Assessment: 1. 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 HTN,  3. CAD 4. Mild hypokalemia, improved 5. Sec HPTH   Plan: 1.OK to DC from renal standpoint.  She has appt 05/24/13 at 10:30 at our office.  My office will call about getting labs done next week  [name] T

## 2013-05-13 NOTE — Interval H&P Note (Signed)
History and Physical Interval Note:  05/13/2013 6:53 AM  [name]  has presented today for surgery, with the diagnosis of ESRD  The various methods of treatment have been discussed with the patient and family. After consideration of risks, benefits and other options for treatment, the patient has consented to  Procedure(s): [place] (Right) as a surgical intervention .  The patient's history has been reviewed, patient examined, no change in status, stable for surgery.  I have reviewed the patient's chart and labs.  Questions were answered to the patient's satisfaction.     Tarell Schollmeyer

## 2013-05-13 NOTE — Anesthesia Postprocedure Evaluation (Signed)
  Anesthesia Post-op Note  Patient: [name]  Procedure(s) Performed: Procedure(s): BASCILIC VEIN TRANSPOSITION (Right)  Patient Location: PACU  Anesthesia Type:General  Level of Consciousness: awake  Airway and Oxygen Therapy: Patient Spontanous Breathing  Post-op Pain: mild  Post-op Assessment: Post-op Vital signs reviewed  Post-op Vital Signs: stable  Complications: No apparent anesthesia complications

## 2013-05-13 NOTE — Discharge Summary (Signed)
Physician Discharge Summary  [name] MRN: [id number] DOB/AGE: [birth year] 51 y.o.  PCP: No primary provider on file.   Admit date: 05/06/2013 Discharge date: 05/13/2013  Discharge Diagnoses:    Status post basophilic vein transposition   HTN (hypertension), malignant   CHF (congestive heart failure)   CAD (coronary artery disease)   CKD (chronic kidney disease), stage IV   Hypertensive heart disease   Acute-on-chronic renal failure    Followup #1 PCP to follow her BMP closely on a weekly basis #2 patient to follow up with [facility], impending CkD stage V progressing to end-stage renal disease, and to keep assessing need for hemodialysis      Medication List         amLODipine 10 MG tablet  Commonly known as:  NORVASC  Take 1 tablet (10 mg total) by mouth daily.     calcitRIOL 0.25 MCG capsule  Commonly known as:  ROCALTROL  Take 1 capsule (0.25 mcg total) by mouth every Monday, Wednesday, and Friday.     cloNIDine 0.2 mg/24hr patch  Commonly known as:  CATAPRES - Dosed in mg/24 hr  Place 1 patch (0.2 mg total) onto the skin once a week.  Start taking on:  05/16/2013     furosemide 80 MG tablet  Commonly known as:  LASIX  Take 1 tablet (80 mg total) by mouth 2 (two) times daily.     hydrALAZINE 100 MG tablet  Commonly known as:  APRESOLINE  Take 1 tablet (100 mg total) by mouth 3 (three) times daily.     labetalol 200 MG tablet  Commonly known as:  NORMODYNE  Take 1 tablet (200 mg total) by mouth 3 (three) times daily.     nicotine 21 mg/24hr patch  Commonly known as:  NICODERM CQ - dosed in mg/24 hours  Place 1 patch onto the skin daily.     nitroGLYCERIN 0.4 MG SL tablet  Commonly known as:  NITROSTAT  Place 0.4 mg under the tongue every 5 (five) minutes as needed for chest pain.        Discharge Condition:   Disposition: 06-Home-Health Care Svc   Consults:  Vascular surgery Nephrology    Significant Diagnostic  Studies: Ct Head Wo Contrast  05/06/2013   *RADIOLOGY REPORT*  Clinical Data: Headache  CT HEAD WITHOUT CONTRAST  Technique:  Contiguous axial images were obtained from the base of the skull through the vertex without contrast.  Comparison: 09/25/2012  Findings: No evidence for an acute hemorrhage.  No hydrocephalus, midline shift, or abnormal extra-axial fluid collection.  No CT evidence for acute ischemia.  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  A right sided aneurysm clip is seen again.  The right frontal ventriculostomy shunt catheter is again noted with stable tip position.  Inferior left cerebellar infarct again noted.  Postsurgical change noted in the right frontotemporal skull.  IMPRESSION: Stable.  No new or acute interval findings.   Original Report Authenticated By: Misty Stanley, M.D.   US Renal  05/06/2013   *RADIOLOGY REPORT*  Clinical Data: Acute renal failure superimposed on chronic kidney disease.  RENAL/URINARY TRACT ULTRASOUND COMPLETE  Comparison:  None.  Findings:  Right Kidney:  Right kidney shows diffusely increased parenchymal echogenicity.  It measures 9.8 cm in length.  No right renal mass or stone or hydronephrosis.  Left Kidney:  Left kidney is increased in echogenicity diffusely. There are to lower pole cyst seen laterally,  one measuring 9 mm and the other measuring 14 mm in size.  The left kidney measures 9.9 cm in length.  Bladder:  The bladder is unremarkable.  IMPRESSION: Increased renal parenchymal echogenicity bilaterally is consistent with medical renal disease.  No hydronephrosis.  Two small left renal cysts.   Original Report Authenticated By: Lajean Manes, M.D.   Dg Chest Port 1 View  05/07/2013   *RADIOLOGY REPORT*  Clinical Data: CHF  PORTABLE CHEST - 1 VIEW  Comparison: Prior radiograph from 05/06/2013  Findings:  VP shunt catheter tubing is again seen coursing over the right anterior chest.  Cardiomegaly is not significantly changed as  compared to the prior examination.  The lungs are mildly hypoinflated.  Diffuse prominence of the interstitial markings with a few scattered Kerley lines are not significantly changed as compared to the prior study. There is question of a left pleural effusion.  No pneumothorax.  No soft tissues and osseous structures are unchanged.  IMPRESSION: Stable cardiomegaly and mild interstitial pulmonary edema, consistent with CHF.   Original Report Authenticated By: Jeannine Boga, M.D.   Dg Chest Port 1 View  05/06/2013   *RADIOLOGY REPORT*  Clinical Data: Shortness of breath.  Current history of hypertension.  Current smoker.  PORTABLE CHEST - 1 VIEW 05/06/2013 1419 hours:  Comparison: Portable chest x-ray 04/30/2012 dating back to 03/05/2012.  Findings: Cardiac silhouette moderately enlarged but stable. Pulmonary venous hypertension and scattered Kerley B lines consistent with minimal interstitial pulmonary edema.  No visible pleural effusions.  No confluent airspace consolidation. Ventriculoperitoneal shunt catheter overlies the right neck, chest, and upper abdomen.  IMPRESSION: Stable moderate cardiomegaly.  Minimal interstitial pulmonary edema consistent with CHF.   Original Report Authenticated By: Evangeline Dakin, M.D.   Next    Microbiology: Recent Results (from the past 240 hour(s))  MRSA PCR SCREENING     Status: None   Collection Time    05/06/13  6:29 PM      Result Value Range Status   MRSA by PCR NEGATIVE  NEGATIVE Final   Comment:            The GeneXpert MRSA Assay (FDA     approved for NASAL specimens     only), is one component of a     comprehensive MRSA colonization     surveillance program. It is not     intended to diagnose MRSA     infection nor to guide or     monitor treatment for     MRSA infections.     Labs: Results for orders placed during the hospital encounter of 05/06/13 (from the past 48 hour(s))  BASIC METABOLIC PANEL     Status: Abnormal   Collection  Time    05/12/13  5:20 AM      Result Value Range   Sodium 140  135 - 145 mEq/L   Potassium 3.5  3.5 - 5.1 mEq/L   Chloride 104  96 - 112 mEq/L   CO2 24  19 - 32 mEq/L   Glucose, Bld 86  70 - 99 mg/dL   BUN 34 (*) 6 - 23 mg/dL   Creatinine, Ser 5.19 (*) 0.50 - 1.10 mg/dL   Calcium 10.1  8.4 - 10.5 mg/dL   GFR calc non Af Amer 9 (*) >90 mL/min   GFR calc Af Amer 10 (*) >90 mL/min   Comment:            The eGFR has been calculated  using the CKD EPI equation.     This calculation has not been     validated in all clinical     situations.     eGFR's persistently     <90 mL/min signify     possible Chronic Kidney Disease.  BASIC METABOLIC PANEL     Status: Abnormal   Collection Time    05/13/13  5:47 AM      Result Value Range   Sodium 138  135 - 145 mEq/L   Potassium 3.5  3.5 - 5.1 mEq/L   Chloride 102  96 - 112 mEq/L   CO2 24  19 - 32 mEq/L   Glucose, Bld 95  70 - 99 mg/dL   BUN 37 (*) 6 - 23 mg/dL   Creatinine, Ser 5.18 (*) 0.50 - 1.10 mg/dL   Calcium 10.2  8.4 - 10.5 mg/dL   GFR calc non Af Amer 9 (*) >90 mL/min   GFR calc Af Amer 10 (*) >90 mL/min   Comment:            The eGFR has been calculated     using the CKD EPI equation.     This calculation has not been     validated in all clinical     situations.     eGFR's persistently     <90 mL/min signify     possible Chronic Kidney Disease.  PROTIME-INR     Status: None   Collection Time    05/13/13  5:47 AM      Result Value Range   Prothrombin Time 12.9  11.6 - 15.2 seconds   INR 0.99  0.00 - 1.49  CBC     Status: Abnormal   Collection Time    05/13/13  5:47 AM      Result Value Range   WBC 4.8  4.0 - 10.5 K/uL   RBC 4.16  3.87 - 5.11 MIL/uL   Hemoglobin 12.1  12.0 - 15.0 g/dL   HCT 34.6 (*) 36.0 - 46.0 %   MCV 83.2  78.0 - 100.0 fL   MCH 29.1  26.0 - 34.0 pg   MCHC 35.0  30.0 - 36.0 g/dL   RDW 15.3  11.5 - 15.5 %   Platelets 252  150 - 400 K/uL     HPI :*52y/o with known severe HTN ran out of  her meds and developed malignant hypertension with headache, blurring of vision and AKI on top of CKD. Creatinine was 2.2 in December of 2013. There was some question about noncompliance and missing her medications.According to patient she had been compliant with her medication, but ran out about 3 days a go, and was unable to get refill, for insurance reasons. On 05/04/13, she developed a frontal headache, which has persisted, unassociated with visual obscurations. Since 05/05/13, she has had recurrent vomiting, up to 4-5 times daily, including today. Although she is intermittently SOB, this has been going on for about 6 months, and is no worse at this time. She denies chest pain, cough, fever or chills. In the ED, BP was 298/160-230/102. Head CT scan was devoid of acute findings, and CXR revealed stable moderate cardiomegaly. Minimal interstitial pulmonary edema consistent with CHF. [place] [address]. TODAY per patient her dry weight is approximately 165Lbs current weight 143 Lbs rising creatinine at 5.53   cardiac echo on 05/07/2013   - Left ventricle: moderate LVH; LVEF= 60% to 65%.  Features are consistent with a  pseudonormal left ventricular filling pattern, with concomitant abnormal relaxation and increased filling pressure (grade 2 diastolic dysfunction). Doppler parameters are consistent with elevated ventricular end-diastolic filling pressure. - Tricuspid valve: Mild regurgitation. - Pericardium, extracardiac: A small pericardial effusion is noted, with no hemodynamic sequelae.     HOSPITAL COURSE:  1. HTN (hypertension), malignant: Patient's hypertension improved from admission (298/160-230/102), but still elevated. She has required ongoing titration of her antihypertensive regimen. Continue labetalol, continue Norvasc 10 mg daily. Increased clonidine patch 0.2 mg. increased hydralazine to 100 mg by mouth 3 times a day . Marland Kitchen  2. CHF (congestive heart failure): Patient  states had a dry weight of 165 pounds . Initiated on Lasix 80 mg by mouth twice a day by nephrology  3. CAD (coronary artery disease): Patient has known CAD, s/p NSTEMI 2009, s/p DES. 12-Lead EKG negative for acute ischemic changes. No clinical evidence of ACS at this time.   4. CKD (chronic kidney disease), stage IV: Baseline creatinine is 2.21-2.56 in 09/2012. At presentation, creatinine was 3.78, and has risen to 5.35 most likely to over diuresis. During hemodialysis according to Dr Jeneen Rinks Deterding. Upper extremity vein mapping completed . Pending surgery today, vascular recommend right basilic vein transposition on Friday , okay to discharge home after surgery according to nephrology and vascular surgery   5. Tobacco Abuse: Patient admittedly, smokes a pack of cigarettes per day. Counseled appropriately, and commenced on Nicoderm CQ patch.     6. deconditioning; no PT OT needs at this time      Discharge Exam:   Blood pressure 159/75, pulse 65, temperature 97.7 F (36.5 C), temperature source Oral, resp. rate 16, height 5\' 1"  (1.549 m), weight 62.506 kg (137 lb 12.8 oz), SpO2 95.00%.  Cardiovascular: Normal rate, regular rhythm, normal heart sounds and intact distal pulses.  Pulmonary/Chest: Effort normal and breath sounds normal. No respiratory distress.  Abdominal: Soft. Normal appearance and bowel sounds are normal. She exhibits no distension. There is no tenderness.  Musculoskeletal: She exhibits no edema and no tenderness.  Neurological: She is alert. No cranial nerve deficit.           SignedReyne Dumas 05/13/2013, 8:06 AM

## 2013-05-13 NOTE — Op Note (Signed)
OPERATIVE REPORT  DATE OF SURGERY: 05/13/2013  PATIENT: [name], 52 y.o. female MRN: [id number]  DOB: 19-Feb-1961  PRE-OPERATIVE DIAGNOSIS: Chronic renal insufficiency  POST-OPERATIVE DIAGNOSIS:  Same  PROCEDURE: Right basilic vein transposition AV fistula  SURGEON:  Curt Jews, M.D.  ASSISTANT: Nurse  ANESTHESIA:  Gen.  EBL: 50 ml  Total I/O In: 478 [I.V.:478] Out: 250 [Urine:200; Blood:50]  BLOOD ADMINISTERED: None  DRAINS: None  SPECIMEN: None  COUNTS CORRECT:  YES  PLAN OF CARE: PACU   PATIENT DISPOSITION:  PACU - hemodynamically stable  PROCEDURE DETAILS: The patient was taken to the operating room placed supine position where the area of the right arm was prepped and draped in usual sterile fashion. Her basilic vein was imaged with SonoSite ultrasound and was marked from below the antecubital space up to the exit well. Incisions were made over the basilic vein leaving several skin bridges. The basilic vein was of good caliber throughout its course. There were multiple side branches and these were ligated with 304 0 silk ties and divided. The vein was ligated distally divided and was removed from the tunnel and was left intact in the axilla. The vein had been marked to prevent twisting. The vein was gently dilated with heparinized saline was of excellent caliber. Next an incision was made over the brachial artery pulse at the antecubital space and the artery was of good caliber. A tunnel was created from the brachial artery at the antecubital space up to the entry of the basilic vein into the axillary vein at the axilla. This was a subcuticular tunnel. The vein was brought through the tunnel. The brachial artery was occluded proximally and distally was opened 11 blade and symmetry Potts scissors. The vein was cut to appropriate length and was sewn end-to-side to the artery with a running 6-0 Prolene suture. Clamps removed and excellent thrill was noted through the  fistula. The wounds were irrigated with saline. Hemostasis was obtained with electrocautery. The wounds were closed with 3-0 Vicryl in the subcutaneous and subcuticular tissue. Benzoin Steri-Strips were applied. The patient was transferred to the recovery room in stable condition   Curt Jews, M.D. 05/13/2013 12:47 PM

## 2013-05-13 NOTE — Progress Notes (Signed)
Received paged from central monitor that pt's leads are off.  Attempted fixing the leads and batteries changed as well unsuccessful.  Daryl at the monitor called and informed also charge nurse Loma Sousa, RN made aware that tele box not  Working.  Instructed she will look at it.  Karie Kirks, Therapist, sports.

## 2013-05-13 NOTE — Progress Notes (Signed)
Report given to receiving RN. No questions or concerns at this time. Patient continues to show signs of some confusion. MD is aware and receiving RN is was made aware as well. Patient is stable and appears in no acute distress or discomfort.

## 2013-05-13 NOTE — Anesthesia Preprocedure Evaluation (Addendum)
Anesthesia Evaluation  Patient identified by MRN, date of birth, ID band Patient awake    Reviewed: Allergy & Precautions, H&P , NPO status , Patient's Chart, lab work & pertinent test results  Airway Mallampati: I  Neck ROM: Full    Dental  (+) Teeth Intact   Pulmonary neg pulmonary ROS,  breath sounds clear to auscultation        Cardiovascular hypertension, + CAD, + Peripheral Vascular Disease and +CHF Rhythm:Regular Rate:Normal     Neuro/Psych CVA    GI/Hepatic   Endo/Other    Renal/GU CRF, Renal Insufficiency and Renal hypertensionRenal disease     Musculoskeletal   Abdominal   Peds  Hematology   Anesthesia Other Findings   Reproductive/Obstetrics                         Anesthesia Physical Anesthesia Plan  ASA: III  Anesthesia Plan: General   Post-op Pain Management:    Induction: Intravenous  Airway Management Planned: LMA  Additional Equipment:   Intra-op Plan:   Post-operative Plan: Extubation in OR  Informed Consent: I have reviewed the patients History and Physical, chart, labs and discussed the procedure including the risks, benefits and alternatives for the proposed anesthesia with the patient or authorized representative who has indicated his/her understanding and acceptance.   Dental advisory given  Plan Discussed with: CRNA and Surgeon  Anesthesia Plan Comments:        Anesthesia Quick Evaluation

## 2013-05-13 NOTE — Progress Notes (Signed)
Patient vomiting once arrived back from procedure. IV zofran given. Will continue to monitor patient for further changes in condition.

## 2013-05-14 LAB — BASIC METABOLIC PANEL
CO2: 24 mEq/L (ref 19–32)
Calcium: 9.8 mg/dL (ref 8.4–10.5)
Creatinine, Ser: 5.44 mg/dL — ABNORMAL HIGH (ref 0.50–1.10)

## 2013-05-14 NOTE — Progress Notes (Signed)
Patient resting comfortably in bed throughout shift.  No complaints of pain. No signs of confusion noted this shift. RN will continue to monitor. Shellee Milo, RN

## 2013-05-14 NOTE — Progress Notes (Signed)
[facility]  Daily Progress Note  Assessment/Planning: POD #1 s/p R single stage BVT   No steal sx  Palpable thrill  Follow up in office in 6 weeks  Subjective  - 1 Day Post-Op  No steal sx, pain ok  Objective Filed Vitals:   05/13/13 1431 05/13/13 1740 05/13/13 2047 05/14/13 0500  BP: 143/73 159/85 153/79 146/68  Pulse: 69 70 71 67  Temp: 97.1 F (36.2 C)  97.6 F (36.4 C) 98 F (36.7 C)  TempSrc: Oral  Oral Oral  Resp: 17  18 18   Height:      Weight:    137 lb 12.6 oz (62.5 kg)  SpO2: 95%  93% 93%    Intake/Output Summary (Last 24 hours) at 05/14/13 0858 Last data filed at 05/14/13 0818  Gross per 24 hour  Intake   1078 ml  Output    650 ml  Net    428 ml    PULM  CTAB CV  RRR GI  soft, NTND VASC  R arm bandaged, palpable thrill, palpable L radial pulse  Laboratory CBC    Component Value Date/Time   WBC 4.8 05/13/2013 0547   HGB 12.1 05/13/2013 0547   HCT 34.6* 05/13/2013 0547   PLT 252 05/13/2013 0547    BMET    Component Value Date/Time   NA 137 05/14/2013 0510   K 3.6 05/14/2013 0510   CL 100 05/14/2013 0510   CO2 24 05/14/2013 0510   GLUCOSE 97 05/14/2013 0510   BUN 40* 05/14/2013 0510   CREATININE 5.44* 05/14/2013 0510   CALCIUM 9.8 05/14/2013 0510   GFRNONAA 8* 05/14/2013 0510   GFRAA 10* 05/14/2013 0510    Adele Barthel, MD [facility] Office: [phone] Pager: [phone]  05/14/2013, 8:58 AM

## 2013-05-14 NOTE — Discharge Summary (Signed)
Physician Discharge Summary  [name]  MRN: [id number]  DOB/AGE: 06-11-1961 52 y.o.  PCP: No primary provider on file.  Admit date: 05/06/2013  Discharge date: 05/14/2013    Discharge Diagnoses:  Status post basophilic vein transposition  HTN (hypertension), malignant  CHF (congestive heart failure)  CAD (coronary artery disease)  CKD (chronic kidney disease), stage IV  Hypertensive heart disease  Acute-on-chronic renal failure    Followup  #1 PCP to follow her BMP closely on a weekly basis  #2 patient to follow up with [facility], impending CkD stage V progressing to end-stage renal disease, and to keep assessing need for hemodialysis    Medication List         amLODipine 10 MG tablet    Commonly known as: NORVASC    Take 1 tablet (10 mg total) by mouth daily.    calcitRIOL 0.25 MCG capsule    Commonly known as: ROCALTROL    Take 1 capsule (0.25 mcg total) by mouth every Monday, Wednesday, and Friday.    cloNIDine 0.2 mg/24hr patch    Commonly known as: CATAPRES - Dosed in mg/24 hr    Place 1 patch (0.2 mg total) onto the skin once a week.    Start taking on: 05/16/2013    furosemide 80 MG tablet    Commonly known as: LASIX    Take 1 tablet (80 mg total) by mouth 2 (two) times daily.    hydrALAZINE 100 MG tablet    Commonly known as: APRESOLINE    Take 1 tablet (100 mg total) by mouth 3 (three) times daily.    labetalol 200 MG tablet    Commonly known as: NORMODYNE    Take 1 tablet (200 mg total) by mouth 3 (three) times daily.    nicotine 21 mg/24hr patch    Commonly known as: NICODERM CQ - dosed in mg/24 hours    Place 1 patch onto the skin daily.    nitroGLYCERIN 0.4 MG SL tablet    Commonly known as: NITROSTAT    Place 0.4 mg under the tongue every 5 (five) minutes as needed for chest pain.      Discharge Condition:  Disposition: 06-Home-Health Care Svc  Consults:  Vascular surgery  Nephrology  Significant Diagnostic Studies:  Ct Head Wo  Contrast  05/06/2013 *RADIOLOGY REPORT* Clinical Data: Headache CT HEAD WITHOUT CONTRAST Technique: Contiguous axial images were obtained from the base of the skull through the vertex without contrast. Comparison: 09/25/2012 Findings: No evidence for an acute hemorrhage. No hydrocephalus, midline shift, or abnormal extra-axial fluid collection. No CT evidence for acute ischemia. 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 A right sided aneurysm clip is seen again. The right frontal ventriculostomy shunt catheter is again noted with stable tip position. Inferior left cerebellar infarct again noted. Postsurgical change noted in the right frontotemporal skull. IMPRESSION: Stable. No new or acute interval findings. Original Report Authenticated By: Misty Stanley, M.D.  US Renal  05/06/2013 *RADIOLOGY REPORT* Clinical Data: Acute renal failure superimposed on chronic kidney disease. RENAL/URINARY TRACT ULTRASOUND COMPLETE Comparison: None. Findings: Right Kidney: Right kidney shows diffusely increased parenchymal echogenicity. It measures 9.8 cm in length. No right renal mass or stone or hydronephrosis. Left Kidney: Left kidney is increased in echogenicity diffusely. There are to lower pole cyst seen laterally, one measuring 9 mm and the other measuring 14 mm in size. The left kidney measures 9.9 cm in length. Bladder: The bladder is unremarkable.  IMPRESSION: Increased renal parenchymal echogenicity bilaterally is consistent with medical renal disease. No hydronephrosis. Two small left renal cysts. Original Report Authenticated By: Lajean Manes, M.D.  Dg Chest Port 1 View  05/07/2013 *RADIOLOGY REPORT* Clinical Data: CHF PORTABLE CHEST - 1 VIEW Comparison: Prior radiograph from 05/06/2013 Findings: VP shunt catheter tubing is again seen coursing over the right anterior chest. Cardiomegaly is not significantly changed as compared to the prior examination. The lungs are mildly hypoinflated. Diffuse  prominence of the interstitial markings with a few scattered Kerley lines are not significantly changed as compared to the prior study. There is question of a left pleural effusion. No pneumothorax. No soft tissues and osseous structures are unchanged. IMPRESSION: Stable cardiomegaly and mild interstitial pulmonary edema, consistent with CHF. Original Report Authenticated By: Jeannine Boga, M.D.  Dg Chest Port 1 View  05/06/2013 *RADIOLOGY REPORT* Clinical Data: Shortness of breath. Current history of hypertension. Current smoker. PORTABLE CHEST - 1 VIEW 05/06/2013 1419 hours: Comparison: Portable chest x-ray 04/30/2012 dating back to 03/05/2012. Findings: Cardiac silhouette moderately enlarged but stable. Pulmonary venous hypertension and scattered Kerley B lines consistent with minimal interstitial pulmonary edema. No visible pleural effusions. No confluent airspace consolidation. Ventriculoperitoneal shunt catheter overlies the right neck, chest, and upper abdomen. IMPRESSION: Stable moderate cardiomegaly. Minimal interstitial pulmonary edema consistent with CHF. Original Report Authenticated By: Evangeline Dakin, M.D.  Next  Microbiology:  Recent Results (from the past 240 hour(s))   MRSA PCR SCREENING Status: None    Collection Time    05/06/13 6:29 PM   Result  Value  Range  Status    MRSA by PCR  NEGATIVE  NEGATIVE  Final    Comment:      The GeneXpert MRSA Assay (FDA     approved for NASAL specimens     only), is one component of a     comprehensive MRSA colonization     surveillance program. It is not     intended to diagnose MRSA     infection nor to guide or     monitor treatment for     MRSA infections.    Labs:  Results for orders placed during the hospital encounter of 05/06/13 (from the past 48 hour(s))   BASIC METABOLIC PANEL Status: Abnormal    Collection Time    05/12/13 5:20 AM   Result  Value  Range    Sodium  140  135 - 145 mEq/L    Potassium  3.5  3.5 - 5.1  mEq/L    Chloride  104  96 - 112 mEq/L    CO2  24  19 - 32 mEq/L    Glucose, Bld  86  70 - 99 mg/dL    BUN  34 (*)  6 - 23 mg/dL    Creatinine, Ser  5.19 (*)  0.50 - 1.10 mg/dL    Calcium  10.1  8.4 - 10.5 mg/dL    GFR calc non Af Amer  9 (*)  >90 mL/min    GFR calc Af Amer  10 (*)  >90 mL/min    Comment:      The eGFR has been calculated     using the CKD EPI equation.     This calculation has not been     validated in all clinical     situations.     eGFR's persistently     <90 mL/min signify     possible Chronic Kidney Disease.   BASIC METABOLIC PANEL Status:  Abnormal    Collection Time    05/13/13 5:47 AM   Result  Value  Range    Sodium  138  135 - 145 mEq/L    Potassium  3.5  3.5 - 5.1 mEq/L    Chloride  102  96 - 112 mEq/L    CO2  24  19 - 32 mEq/L    Glucose, Bld  95  70 - 99 mg/dL    BUN  37 (*)  6 - 23 mg/dL    Creatinine, Ser  5.18 (*)  0.50 - 1.10 mg/dL    Calcium  10.2  8.4 - 10.5 mg/dL    GFR calc non Af Amer  9 (*)  >90 mL/min    GFR calc Af Amer  10 (*)  >90 mL/min    Comment:      The eGFR has been calculated     using the CKD EPI equation.     This calculation has not been     validated in all clinical     situations.     eGFR's persistently     <90 mL/min signify     possible Chronic Kidney Disease.   PROTIME-INR Status: None    Collection Time    05/13/13 5:47 AM   Result  Value  Range    Prothrombin Time  12.9  11.6 - 15.2 seconds    INR  0.99  0.00 - 1.49   CBC Status: Abnormal    Collection Time    05/13/13 5:47 AM   Result  Value  Range    WBC  4.8  4.0 - 10.5 K/uL    RBC  4.16  3.87 - 5.11 MIL/uL    Hemoglobin  12.1  12.0 - 15.0 g/dL    HCT  34.6 (*)  36.0 - 46.0 %    MCV  83.2  78.0 - 100.0 fL    MCH  29.1  26.0 - 34.0 pg    MCHC  35.0  30.0 - 36.0 g/dL    RDW  15.3  11.5 - 15.5 %    Platelets  252  150 - 400 K/uL    HPI :*52y/o with known severe HTN ran out of her meds and developed malignant hypertension with headache, blurring  of vision and AKI on top of CKD. Creatinine was 2.2 in December of 2013. There was some question about noncompliance and missing her medications.According to patient she had been compliant with her medication, but ran out about 3 days a go, and was unable to get refill, for insurance reasons. On 05/04/13, she developed a frontal headache, which has persisted, unassociated with visual obscurations. Since 05/05/13, she has had recurrent vomiting, up to 4-5 times daily, including today. Although she is intermittently SOB, this has been going on for about 6 months, and is no worse at this time. She denies chest pain, cough, fever or chills. In the ED, BP was 298/160-230/102. Head CT scan was devoid of acute findings, and CXR revealed stable moderate cardiomegaly. Minimal interstitial pulmonary edema consistent with CHF. [place] [address]. TODAY per patient her dry weight is approximately 165Lbs current weight 143 Lbs rising creatinine at 5.53  cardiac echo on 05/07/2013   - Left ventricle: moderate LVH; LVEF= 60% to 65%.  Features are consistent with a pseudonormal left ventricular filling pattern, with concomitant abnormal relaxation and increased filling pressure (grade 2 diastolic dysfunction). Doppler parameters are consistent with elevated ventricular end-diastolic filling  pressure. - Tricuspid valve: Mild regurgitation. - Pericardium, extracardiac: A small pericardial effusion is noted, with no hemodynamic sequelae.   HOSPITAL COURSE:  1. HTN (hypertension), malignant: Patient's hypertension improved from admission (298/160-230/102), but still elevated. She has required ongoing titration of her antihypertensive regimen. Continue labetalol, continue Norvasc 10 mg daily. Increased clonidine patch 0.2 mg. increased hydralazine to 100 mg by mouth 3 times a day .  Marland Kitchen  2. CHF (congestive heart failure): Patient states had a dry weight of 165 pounds . Initiated on Lasix 80 mg by mouth  twice a day by nephrology  3. CAD (coronary artery disease): Patient has known CAD, s/p NSTEMI 2009, s/p DES. 12-Lead EKG negative for acute ischemic changes. No clinical evidence of ACS at this time.  4. CKD (chronic kidney disease), stage IV: Baseline creatinine is 2.21-2.56 in 09/2012. At presentation, creatinine was 3.78, and has risen to 5.35 most likely to over diuresis. During hemodialysis according to Dr Jeneen Rinks Deterding. Upper extremity vein mapping completed . Pending surgery today, vascular recommend right basilic vein transposition on Friday , okay to discharge home after surgery according to nephrology and vascular surgery  Noted to have some confusion postoperatively and discharge was held for one more day, stable for discharge today as confusion has resolved 5. Tobacco Abuse: Patient admittedly, smokes a pack of cigarettes per day. Counseled appropriately, and commenced on Nicoderm CQ patch.  6. deconditioning; no PT OT needs at this time    Discharge Exam:  Blood pressure 159/75, pulse 65, temperature 97.7 F (36.5 C), temperature source Oral, resp. rate 16, height 5\' 1"  (1.549 m), weight 62.506 kg (137 lb 12.8 oz), SpO2 95.00%.  Cardiovascular: Normal rate, regular rhythm, normal heart sounds and intact distal pulses.  Pulmonary/Chest: Effort normal and breath sounds normal. No respiratory distress.  Abdominal: Soft. Normal appearance and bowel sounds are normal. She exhibits no distension. There is no tenderness.  Musculoskeletal: She exhibits no edema and no tenderness.  Neurological: She is alert. No cranial nerve deficit.      SignedReyne Dumas  05/13/2013, 8:06 AM

## 2013-05-17 ENCOUNTER — Observation Stay (HOSPITAL_COMMUNITY)
Admission: EM | Admit: 2013-05-17 | Discharge: 2013-05-18 | Disposition: A | Discharge status: Home / Self Care | Payer: No Typology Code available for payment source | Attending: Internal Medicine | Admitting: Internal Medicine

## 2013-05-17 ENCOUNTER — Encounter (HOSPITAL_COMMUNITY): Payer: Self-pay | Admitting: Vascular Surgery

## 2013-05-17 ENCOUNTER — Emergency Department (HOSPITAL_COMMUNITY): Payer: No Typology Code available for payment source

## 2013-05-17 DIAGNOSIS — Z982 Presence of cerebrospinal fluid drainage device: Secondary | ICD-10-CM | POA: Insufficient documentation

## 2013-05-17 DIAGNOSIS — E875 Hyperkalemia: Secondary | ICD-10-CM | POA: Insufficient documentation

## 2013-05-17 DIAGNOSIS — F17200 Nicotine dependence, unspecified, uncomplicated: Secondary | ICD-10-CM | POA: Insufficient documentation

## 2013-05-17 DIAGNOSIS — I252 Old myocardial infarction: Secondary | ICD-10-CM | POA: Insufficient documentation

## 2013-05-17 DIAGNOSIS — E7800 Pure hypercholesterolemia, unspecified: Secondary | ICD-10-CM | POA: Insufficient documentation

## 2013-05-17 DIAGNOSIS — IMO0001 Reserved for inherently not codable concepts without codable children: Principal | ICD-10-CM | POA: Insufficient documentation

## 2013-05-17 DIAGNOSIS — Z91199 Patient's noncompliance with other medical treatment and regimen due to unspecified reason: Secondary | ICD-10-CM

## 2013-05-17 DIAGNOSIS — R1110 Vomiting, unspecified: Secondary | ICD-10-CM

## 2013-05-17 DIAGNOSIS — Z8679 Personal history of other diseases of the circulatory system: Secondary | ICD-10-CM

## 2013-05-17 DIAGNOSIS — Z79899 Other long term (current) drug therapy: Secondary | ICD-10-CM | POA: Insufficient documentation

## 2013-05-17 DIAGNOSIS — N179 Acute kidney failure, unspecified: Secondary | ICD-10-CM

## 2013-05-17 DIAGNOSIS — I5032 Chronic diastolic (congestive) heart failure: Secondary | ICD-10-CM | POA: Insufficient documentation

## 2013-05-17 DIAGNOSIS — L299 Pruritus, unspecified: Secondary | ICD-10-CM | POA: Insufficient documentation

## 2013-05-17 DIAGNOSIS — I5033 Acute on chronic diastolic (congestive) heart failure: Secondary | ICD-10-CM | POA: Diagnosis present

## 2013-05-17 DIAGNOSIS — Z992 Dependence on renal dialysis: Secondary | ICD-10-CM | POA: Insufficient documentation

## 2013-05-17 DIAGNOSIS — I119 Hypertensive heart disease without heart failure: Secondary | ICD-10-CM

## 2013-05-17 DIAGNOSIS — Z720 Tobacco use: Secondary | ICD-10-CM | POA: Diagnosis present

## 2013-05-17 DIAGNOSIS — N186 End stage renal disease: Secondary | ICD-10-CM | POA: Diagnosis present

## 2013-05-17 DIAGNOSIS — I10 Essential (primary) hypertension: Secondary | ICD-10-CM

## 2013-05-17 DIAGNOSIS — N184 Chronic kidney disease, stage 4 (severe): Secondary | ICD-10-CM

## 2013-05-17 DIAGNOSIS — R51 Headache: Secondary | ICD-10-CM | POA: Insufficient documentation

## 2013-05-17 DIAGNOSIS — Z9119 Patient's noncompliance with other medical treatment and regimen: Secondary | ICD-10-CM | POA: Insufficient documentation

## 2013-05-17 DIAGNOSIS — I701 Atherosclerosis of renal artery: Secondary | ICD-10-CM

## 2013-05-17 DIAGNOSIS — I2510 Atherosclerotic heart disease of native coronary artery without angina pectoris: Secondary | ICD-10-CM | POA: Diagnosis present

## 2013-05-17 DIAGNOSIS — I160 Hypertensive urgency: Secondary | ICD-10-CM

## 2013-05-17 LAB — CBC WITH DIFFERENTIAL/PLATELET
Basophils Absolute: 0 10*3/uL (ref 0.0–0.1)
Eosinophils Absolute: 0 10*3/uL (ref 0.0–0.7)
Eosinophils Relative: 0 % (ref 0–5)
Lymphocytes Relative: 19 % (ref 12–46)
MCH: 27.7 pg (ref 26.0–34.0)
MCV: 83.8 fL (ref 78.0–100.0)
Platelets: 262 10*3/uL (ref 150–400)
RDW: 14.7 % (ref 11.5–15.5)
WBC: 6.5 10*3/uL (ref 4.0–10.5)

## 2013-05-17 LAB — COMPREHENSIVE METABOLIC PANEL
ALT: 12 U/L (ref 0–35)
AST: 23 U/L (ref 0–37)
Albumin: 4.5 g/dL (ref 3.5–5.2)
Calcium: 10.6 mg/dL — ABNORMAL HIGH (ref 8.4–10.5)
Sodium: 137 mEq/L (ref 135–145)
Total Protein: 8.4 g/dL — ABNORMAL HIGH (ref 6.0–8.3)

## 2013-05-17 LAB — PROTIME-INR: INR: 0.98 (ref 0.00–1.49)

## 2013-05-17 LAB — RENAL FUNCTION PANEL
CO2: 22 mEq/L (ref 19–32)
Calcium: 10.7 mg/dL — ABNORMAL HIGH (ref 8.4–10.5)
GFR calc Af Amer: 10 mL/min — ABNORMAL LOW (ref >90)
Glucose, Bld: 91 mg/dL (ref 70–99)
Sodium: 137 mEq/L (ref 135–145)

## 2013-05-17 LAB — APTT: aPTT: 31 seconds (ref 24–37)

## 2013-05-17 MED ORDER — ONDANSETRON HCL 4 MG/2ML IJ SOLN
4.0000 mg | Freq: Once | INTRAMUSCULAR | Status: AC
  Administered 2013-05-17: 4 mg via INTRAVENOUS
  Filled 2013-05-17: qty 2

## 2013-05-17 MED ORDER — NICOTINE 21 MG/24HR TD PT24
21.0000 mg | MEDICATED_PATCH | Freq: Every day | TRANSDERMAL | Status: DC
  Administered 2013-05-18: 21 mg via TRANSDERMAL
  Filled 2013-05-17: qty 1

## 2013-05-17 MED ORDER — CALCITRIOL 0.25 MCG PO CAPS
0.2500 ug | ORAL_CAPSULE | ORAL | Status: DC
  Administered 2013-05-18: 0.25 ug via ORAL
  Filled 2013-05-17: qty 1

## 2013-05-17 MED ORDER — CAMPHOR-MENTHOL 0.5-0.5 % EX LOTN
TOPICAL_LOTION | CUTANEOUS | Status: DC | PRN
  Filled 2013-05-17: qty 222

## 2013-05-17 MED ORDER — AMLODIPINE BESYLATE 10 MG PO TABS
10.0000 mg | ORAL_TABLET | Freq: Every day | ORAL | Status: DC
  Administered 2013-05-18: 10 mg via ORAL
  Filled 2013-05-17: qty 1

## 2013-05-17 MED ORDER — SODIUM CHLORIDE 0.9 % IJ SOLN
3.0000 mL | Freq: Two times a day (BID) | INTRAMUSCULAR | Status: DC
  Administered 2013-05-18: 3 mL via INTRAVENOUS

## 2013-05-17 MED ORDER — NITROGLYCERIN 0.4 MG SL SUBL: 0.4000 mg | SUBLINGUAL_TABLET | SUBLINGUAL | Status: DC | PRN

## 2013-05-17 MED ORDER — NITROGLYCERIN 2 % TD OINT: 0.5000 [in_us] | TOPICAL_OINTMENT | Freq: Once | TRANSDERMAL | Status: DC

## 2013-05-17 MED ORDER — CLONIDINE HCL 0.2 MG/24HR TD PTWK
0.2000 mg | MEDICATED_PATCH | TRANSDERMAL | Status: DC
  Administered 2013-05-17: 0.2 mg via TRANSDERMAL
  Filled 2013-05-17: qty 1

## 2013-05-17 MED ORDER — LABETALOL HCL 200 MG PO TABS
200.0000 mg | ORAL_TABLET | Freq: Three times a day (TID) | ORAL | Status: DC
  Administered 2013-05-18 (×2): 200 mg via ORAL
  Filled 2013-05-17 (×4): qty 1

## 2013-05-17 MED ORDER — LABETALOL HCL 5 MG/ML IV SOLN
20.0000 mg | Freq: Once | INTRAVENOUS | Status: AC
Start: 2013-05-17 — End: 2013-05-17
  Administered 2013-05-17: 20 mg via INTRAVENOUS
  Filled 2013-05-17: qty 4

## 2013-05-17 MED ORDER — CLONIDINE HCL 0.1 MG PO TABS
0.2000 mg | ORAL_TABLET | Freq: Once | ORAL | Status: AC
  Administered 2013-05-17: 0.2 mg via ORAL
  Filled 2013-05-17: qty 2

## 2013-05-17 MED ORDER — HEPARIN SODIUM (PORCINE) 5000 UNIT/ML IJ SOLN
5000.0000 [IU] | Freq: Three times a day (TID) | INTRAMUSCULAR | Status: DC
  Administered 2013-05-18 (×3): 5000 [IU] via SUBCUTANEOUS
  Filled 2013-05-17 (×5): qty 1

## 2013-05-17 MED ORDER — HYDRALAZINE HCL 50 MG PO TABS
100.0000 mg | ORAL_TABLET | Freq: Once | ORAL | Status: AC
  Administered 2013-05-17: 100 mg via ORAL
  Filled 2013-05-17: qty 2

## 2013-05-17 MED ORDER — HYDRALAZINE HCL 50 MG PO TABS
100.0000 mg | ORAL_TABLET | Freq: Three times a day (TID) | ORAL | Status: DC
  Administered 2013-05-18 (×2): 100 mg via ORAL
  Filled 2013-05-17 (×4): qty 2

## 2013-05-17 MED ORDER — ONDANSETRON HCL 4 MG/2ML IJ SOLN: 4.0000 mg | Freq: Three times a day (TID) | INTRAMUSCULAR | Status: AC | PRN

## 2013-05-17 NOTE — H&P (Signed)
[facility] History and Physical  Demara Abdala [id number] DOB: 10-02-1961 DOA: 05/17/2013  Referring physician: Lita Mains EDP PCP: No primary provider on file.  Specialists: none   Chief Complaint: Headache, HTn urgency  HPI: [name] is a 52 y.o. female with known history of severe HTN, s/p Spontaneous intracerebral hemorrhage, s/p [facility] 03/2002, required V-P Shunt, CAD, s/p NSTEMI 11/2007, s/p DES to RCA, CKD-4 (baseline creatinine 2.21-2.56 in 09/2012) who was just discharged 05/14/2013 for malignant hypertension with headache blurred vision/ AKI who presented back to the emergency room 05/17/2013 in her own words for itching on her hands, for which she apparently is disabled She, states she's also had a chronic headaches and has not been able to take her medications that she was prescribed [amlodipine 10 mg daily, hydralazine 100 3 times a day, labetalol 200 3 times a day, clonidine transdermal 0.2 mg q. 7 days She complained of significant headache to the emergency room doctor and it was noted that her blood pressure was initially 234/84 in the emergency room. She was given labetalol IV push 20 mg and her blood pressure came down after implementation of her home medication regimen.  Labs show hyperkalemia 5.1, BUN 50, creatinine 5.09 Normal CBC, normal INR, glucose 90, CT head shows stable appearance. No change from prior  . EKG shows flipped T waves in lateral leads, which is not new   Review of Systems: The patient is a poor historian and states only that she came in because of itching of her hands. She does not think she can be able to get her medicines with help. 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 Denies chest pain, nausea, vomiting, does have some headache, which is now 4/10 denies dysuria. Still produces a little bit of urine, denies hematemesis, hematuria, melena  Past Medical History  Diagnosis Date  . HTN (hypertension)      resistant  . Hypercholesterolemia   . Tobacco abuse     resumed smoking half a pack a day. She was a smoker in the past and states she was abl eto stop in the past using nicotine patches  . CAD (coronary artery disease)     Pt has St elevation MI in Feb 2009. Left hear tcath at tha ttime showed a 70% distal LAD lesion that was hazy consistent with a plaque rupture. She had a 50% distal circumflex stenosis an a 95% distal RCA stenosis. She did havve drug eluting stent placed in her distal RCA  . Chronic kidney disease     most recent creatinine 1.4 in 3/10  . Intracranial hemorrhage, spontaneous intraparenchymal, idiopathic, remote, resolved   . Diastolic heart failure     pt most recent echo was in Feb 2009 w an EF of 60% and severe left ventricular hypertrophy. The pt did have evidence of diastolic dysfunction. The RV appeared normal  . Subarachnoid hemorrhage     03/2012   Past Surgical History  Procedure Laterality Date  . Ventriculoperitoneal shunt  03/27/2012    Procedure: SHUNT INSERTION VENTRICULAR-PERITONEAL;  Surgeon: Winfield Cunas, MD;  Location: [place] [facility];  Service: Neurosurgery;  Laterality: N/A;  Ventricular-Peritoneal Shunt Insertion  . Cardiac catheterization    . Coronary angioplasty    . Abdominal hysterectomy    . Bascilic vein transposition Right 05/13/2013    Procedure: [place];  Surgeon: Rosetta Posner, MD;  Location: [place] [facility];  Service: Vascular;  Laterality: Right;   Social  History:  reports that she has been smoking Cigarettes.  She has a 7 pack-year smoking history. She has never used smokeless tobacco. She reports that she does not drink alcohol or use illicit drugs.   Allergies  Allergen Reactions  . Ampicillin Hives and Rash  . Penicillins Hives and Rash    Family History  Problem Relation Age of Onset  . Coronary artery disease Other     premature in 1st degree relatives     Prior to Admission medications   Medication Sig Start Date  End Date Taking? Authorizing Provider  amLODipine (NORVASC) 10 MG tablet Take 1 tablet (10 mg total) by mouth daily. 05/13/13  Yes Reyne Dumas, MD  calcitRIOL (ROCALTROL) 0.25 MCG capsule Take 1 capsule (0.25 mcg total) by mouth every Monday, Wednesday, and Friday. 05/13/13  Yes Reyne Dumas, MD  cloNIDine (CATAPRES - DOSED IN MG/24 HR) 0.2 mg/24hr patch Place 1 patch (0.2 mg total) onto the skin once a week. 05/16/13  Yes Reyne Dumas, MD  furosemide (LASIX) 80 MG tablet Take 1 tablet (80 mg total) by mouth 2 (two) times daily. 05/13/13  Yes Reyne Dumas, MD  hydrALAZINE (APRESOLINE) 100 MG tablet Take 1 tablet (100 mg total) by mouth 3 (three) times daily. 05/13/13  Yes Reyne Dumas, MD  labetalol (NORMODYNE) 200 MG tablet Take 1 tablet (200 mg total) by mouth 3 (three) times daily. 09/28/12  Yes Ripudeep K Rai, MD  nicotine (NICODERM CQ - DOSED IN MG/24 HOURS) 21 mg/24hr patch Place 1 patch onto the skin daily. 05/13/13  Yes Reyne Dumas, MD  nitroGLYCERIN (NITROSTAT) 0.4 MG SL tablet Place 0.4 mg under the tongue every 5 (five) minutes as needed for chest pain.  04/27/12  Yes Sorin June Leap, MD   Physical Exam: Filed Vitals:   05/17/13 2045 05/17/13 2100 05/17/13 2115 05/17/13 2130  BP: 227/96 218/93 189/86 171/79  Pulse: 71 75 70 70  Temp:      Resp: 19 21 18 18   SpO2: 99% 99% 98% 98%     General:  Alert, withdrawn after female seems a little sleepy  Eyes:  EOMI  ENT:  Soft supple  Neck:  No JVD, no bruit  Cardiovascular:  S1, S2 harsh grade 4/6 murmur left upper sternal edge  Respiratory:  Clinically clear. No added sound  Abdomen:  Soft, nontender, nondistended. No rebound  Skin:  No lower extremity edema  Musculoskeletal:  Range of motion intact  Psychiatric:  Euthymic  Neurologic:  Moves all 4 limbs equally grossly intact cranial 2 through 12 grossly intact  Labs on Admission:  Basic Metabolic Panel:  Recent Labs Lab 05/12/13 0520 05/13/13 0547 05/14/13 0510  05/17/13 2010  NA 140 138 137 137  K 3.5 3.5 3.6 5.1  CL 104 102 100 96  CO2 24 24 24 24   GLUCOSE 86 95 97 90  BUN 34* 37* 40* 50*  CREATININE 5.19* 5.18* 5.44* 5.09*  CALCIUM 10.1 10.2 9.8 10.6*   Liver Function Tests:  Recent Labs Lab 05/17/13 2010  AST 23  ALT 12  ALKPHOS 84  BILITOT 0.4  PROT 8.4*  ALBUMIN 4.5   No results found for this basename: LIPASE, AMYLASE,  in the last 168 hours No results found for this basename: AMMONIA,  in the last 168 hours CBC:  Recent Labs Lab 05/13/13 0547 05/17/13 2010  WBC 4.8 6.5  NEUTROABS  --  5.0  HGB 12.1 12.8  HCT 34.6* 38.7  MCV 83.2 83.8  PLT 252 262   Cardiac Enzymes:  Recent Labs Lab 05/17/13 2010  TROPONINI <0.30    BNP (last 3 results)  Recent Labs  09/25/12 1333 05/06/13 1410 05/07/13 0500  PROBNP 4012.0* [id number]* 20527.0*   CBG: No results found for this basename: GLUCAP,  in the last 168 hours  Radiological Exams on Admission: Ct Head Wo Contrast  05/17/2013   *RADIOLOGY REPORT*  Clinical Data: Headache, hypertension, history of intracranial aneurysm.  CT HEAD WITHOUT CONTRAST  Technique:  Contiguous axial images were obtained from the base of the skull through the vertex without contrast. The examination is not limited by patient motion artifact.  Comparison: 05/06/2013  Findings: The ventriculostomy catheter is again identified and stable.  No significant ventricular dilatation is noted.  There are changes consistent with prior aneurysm clips and coils. Postsurgical changes are noted within the right calvarium.  No findings to suggest acute hemorrhage, acute infarction or space- occupying mass lesion are noted.  IMPRESSION: Stable appearance.  No change from prior exam.   Original Report Authenticated By: Inez Catalina, M.D.    EKG: Independently reviewed. See above  Assessment/Plan Active Problems:   * No active hospital problems. *   1. Hypertensive urgency-A. she has been given her regular  scheduled home medications. In addition to IV, which labetalol. Her blood pressures responded nicely to the 150s over 90s. She'll be admitted to telemetry to help sort out her other issues in terms of affording medications. Her disposition will be difficult given the fact that she is demonstrated noncompliance secondary to inability to afford medication- I suspect he would be best for her to be on only one to 2 medications and for them to be as cheap is possible 2. CKD 5-please call nephrology in the morning to help assist with hypertensive issues/CKD-she doesn't at this point need to be at [facility] 3. Hyperkalemia-sample was, hemolyzed. We'll repeat the same 4. History of aneurysm s/p shunt-CT head normal 5. History CAD-unclear why not on aspirin [? SAH?]-smoking cessation should be recommended to patient 6. History of nicotine use-continue patch 7. History diastolic dysfunction grade 2 05/07/2013 echo-hold Lasix for now 8. Itching-can start sarna-this could be uremia? i see no rash thoguh  Please consult renal in the morning   Code Status:  Presumed full  Family Communication:  None at bedside  Disposition Plan:  Inpatient telemetry   Time spent:  [place], [facility] Pager [phone]  If 7PM-7AM, please contact night-coverage [email or web address] Password [facility] 05/17/2013, 9:56 PM

## 2013-05-17 NOTE — ED Notes (Signed)
Pt attempted to use bedpan but was unable to produce urine at this time

## 2013-05-17 NOTE — ED Notes (Signed)
Pt states d/c'd from [facility] on 7/26, had a dialysis cath placed to rt arm, c/o vomiting/headache all day.

## 2013-05-17 NOTE — ED Notes (Signed)
Attempted x 3 to obtain IV access with no success.  IV team paged.  MD notified.

## 2013-05-17 NOTE — ED Provider Notes (Signed)
CSN: [id number]     Arrival date & time 05/17/13  1759 History     First MD Initiated Contact with Patient 05/17/13 [place]     Chief Complaint  Patient presents with  . Emesis   (Consider location/radiation/quality/duration/timing/severity/associated sxs/prior Treatment) HPI Pt admitted to [facility] for hypertensive crisis and renal failure. During her hospitalization, pt had AV fistula placed in RUE without complication. Pt d/c'd home 7/26. States she has taken no blood pressure medication since d/c because she did not fill RX. Said she won't get her check until beginning of next month. Pt c/o frontal HA radiating to back, nausea and vomiting. No visual changes, no focal weakness, no numbness, no fever or chills.  Past Medical History  Diagnosis Date  . HTN (hypertension)     resistant  . Hypercholesterolemia   . Tobacco abuse     resumed smoking half a pack a day. She was a smoker in the past and states she was abl eto stop in the past using nicotine patches  . CAD (coronary artery disease)     Pt has St elevation MI in Feb 2009. Left hear tcath at tha ttime showed a 70% distal LAD lesion that was hazy consistent with a plaque rupture. She had a 50% distal circumflex stenosis an a 95% distal RCA stenosis. She did havve drug eluting stent placed in her distal RCA  . Chronic kidney disease     most recent creatinine 1.4 in 3/10  . Intracranial hemorrhage, spontaneous intraparenchymal, idiopathic, remote, resolved   . Diastolic heart failure     pt most recent echo was in Feb 2009 w an EF of 60% and severe left ventricular hypertrophy. The pt did have evidence of diastolic dysfunction. The RV appeared normal  . Subarachnoid hemorrhage     03/2012   Past Surgical History  Procedure Laterality Date  . Ventriculoperitoneal shunt  03/27/2012    Procedure: SHUNT INSERTION VENTRICULAR-PERITONEAL;  Surgeon: Winfield Cunas, MD;  Location: [place] [facility];  Service: Neurosurgery;  Laterality: N/A;   Ventricular-Peritoneal Shunt Insertion  . Cardiac catheterization    . Coronary angioplasty    . Abdominal hysterectomy    . Bascilic vein transposition Right 05/13/2013    Procedure: [place];  Surgeon: Rosetta Posner, MD;  Location: [facility] OR;  Service: Vascular;  Laterality: Right;   Family History  Problem Relation Age of Onset  . Coronary artery disease Other     premature in 1st degree relatives   History  Substance Use Topics  . Smoking status: Current Every Day Smoker -- 0.20 packs/day for 35 years    Types: Cigarettes  . Smokeless tobacco: Never Used     Comment: 1/2 ppd   . Alcohol Use: No   OB History   Grav Para Term Preterm Abortions TAB SAB Ect Mult Living                 Review of Systems  Constitutional: Negative for fever and chills.  HENT: Positive for neck pain. Negative for neck stiffness.   Eyes: Positive for photophobia. Negative for visual disturbance.  Respiratory: Negative for shortness of breath.   Cardiovascular: Negative for chest pain, palpitations and leg swelling.  Gastrointestinal: Positive for nausea and vomiting. Negative for diarrhea, constipation and abdominal distention.  Musculoskeletal: Negative for myalgias, back pain and arthralgias.  Skin: Negative for pallor, rash and wound.  Neurological: Positive for headaches. Negative for dizziness, syncope, weakness, light-headedness and numbness.  All other  systems reviewed and are negative.    Allergies  Ampicillin and Penicillins  Home Medications   Current Outpatient Rx  Name  Route  Sig  Dispense  Refill  . amLODipine (NORVASC) 10 MG tablet   Oral   Take 1 tablet (10 mg total) by mouth daily.   30 tablet   2   . calcitRIOL (ROCALTROL) 0.25 MCG capsule   Oral   Take 1 capsule (0.25 mcg total) by mouth every Monday, Wednesday, and Friday.   30 capsule   2   . cloNIDine (CATAPRES - DOSED IN MG/24 HR) 0.2 mg/24hr patch   Transdermal   Place 1 patch (0.2 mg total)  onto the skin once a week.   10 patch   12   . furosemide (LASIX) 80 MG tablet   Oral   Take 1 tablet (80 mg total) by mouth 2 (two) times daily.   60 tablet   2   . hydrALAZINE (APRESOLINE) 100 MG tablet   Oral   Take 1 tablet (100 mg total) by mouth 3 (three) times daily.   120 tablet   2   . labetalol (NORMODYNE) 200 MG tablet   Oral   Take 1 tablet (200 mg total) by mouth 3 (three) times daily.   90 tablet   3   . nicotine (NICODERM CQ - DOSED IN MG/24 HOURS) 21 mg/24hr patch   Transdermal   Place 1 patch onto the skin daily.   28 patch   0   . nitroGLYCERIN (NITROSTAT) 0.4 MG SL tablet   Sublingual   Place 0.4 mg under the tongue every 5 (five) minutes as needed for chest pain.           BP 171/79  Pulse 70  Temp(Src) 97.3 F (36.3 C)  Resp 18  SpO2 98% Physical Exam  Nursing note and vitals reviewed. Constitutional: She is oriented to person, place, and time. She appears well-developed and well-nourished. No distress.  Lying in dark room.   HENT:  Head: Normocephalic and atraumatic.  Mouth/Throat: Oropharynx is clear and moist.  Eyes: EOM are normal. Pupils are equal, round, and reactive to light.  Neck: Normal range of motion. Neck supple.  No nuchal rigidity  Cardiovascular: Normal rate and regular rhythm.   Pulmonary/Chest: Effort normal and breath sounds normal. No respiratory distress. She has no wheezes. She has no rales. She exhibits no tenderness.  Abdominal: Soft. Bowel sounds are normal. She exhibits no distension and no mass. There is no tenderness. There is no rebound and no guarding.  Musculoskeletal: Normal range of motion. She exhibits no edema and no tenderness.  Palp thrill R AC. No evidence of infection.   Neurological: She is alert and oriented to person, place, and time.  5/5 motor in all ext, sensation intact.   Skin: Skin is warm and dry. No rash noted. No erythema.  Psychiatric: She has a normal mood and affect. Her behavior is  normal.    ED Course   Procedures (including critical care time)  Labs Reviewed  COMPREHENSIVE METABOLIC PANEL - Abnormal; Notable for the following:    BUN 50 (*)    Creatinine, Ser 5.09 (*)    Calcium 10.6 (*)    Total Protein 8.4 (*)    GFR calc non Af Amer 9 (*)    GFR calc Af Amer 10 (*)    All other components within normal limits  CBC WITH DIFFERENTIAL  TROPONIN I  PROTIME-INR  APTT  URINALYSIS, ROUTINE W REFLEX MICROSCOPIC   Ct Head Wo Contrast  05/17/2013   *RADIOLOGY REPORT*  Clinical Data: Headache, hypertension, history of intracranial aneurysm.  CT HEAD WITHOUT CONTRAST  Technique:  Contiguous axial images were obtained from the base of the skull through the vertex without contrast. The examination is not limited by patient motion artifact.  Comparison: 05/06/2013  Findings: The ventriculostomy catheter is again identified and stable.  No significant ventricular dilatation is noted.  There are changes consistent with prior aneurysm clips and coils. Postsurgical changes are noted within the right calvarium.  No findings to suggest acute hemorrhage, acute infarction or space- occupying mass lesion are noted.  IMPRESSION: Stable appearance.  No change from prior exam.   Original Report Authenticated By: Inez Catalina, M.D.   1. Hypertensive urgency   2. Headache   3. Vomiting     Date: 05/17/2013  Rate: 73  Rhythm: normal sinus rhythm  QRS Axis: normal  Intervals: normal  ST/T Wave abnormalities: ST depressions laterally  Conduction Disutrbances:none  Narrative Interpretation:   Old EKG Reviewed: changes noted   MDM  BP improved with oral med and labetalol IV push. [facility] to see in ED and admit.   Julianne Rice, MD 05/17/13 [phone]

## 2013-05-18 DIAGNOSIS — N189 Chronic kidney disease, unspecified: Secondary | ICD-10-CM

## 2013-05-18 DIAGNOSIS — N179 Acute kidney failure, unspecified: Secondary | ICD-10-CM

## 2013-05-18 DIAGNOSIS — I10 Essential (primary) hypertension: Secondary | ICD-10-CM

## 2013-05-18 DIAGNOSIS — N184 Chronic kidney disease, stage 4 (severe): Secondary | ICD-10-CM

## 2013-05-18 DIAGNOSIS — I2510 Atherosclerotic heart disease of native coronary artery without angina pectoris: Secondary | ICD-10-CM

## 2013-05-18 LAB — CBC
HCT: 35.1 % — ABNORMAL LOW (ref 36.0–46.0)
Hemoglobin: 11.5 g/dL — ABNORMAL LOW (ref 12.0–15.0)
MCV: 83.8 fL (ref 78.0–100.0)
RBC: 4.19 MIL/uL (ref 3.87–5.11)
RDW: 14.7 % (ref 11.5–15.5)
WBC: 5.2 10*3/uL (ref 4.0–10.5)

## 2013-05-18 LAB — COMPREHENSIVE METABOLIC PANEL
Albumin: 3.9 g/dL (ref 3.5–5.2)
Alkaline Phosphatase: 71 U/L (ref 39–117)
BUN: 54 mg/dL — ABNORMAL HIGH (ref 6–23)
Calcium: 10.4 mg/dL (ref 8.4–10.5)
Creatinine, Ser: 5.27 mg/dL — ABNORMAL HIGH (ref 0.50–1.10)
GFR calc Af Amer: 10 mL/min — ABNORMAL LOW (ref >90)
Glucose, Bld: 102 mg/dL — ABNORMAL HIGH (ref 70–99)
Potassium: 3.8 mEq/L (ref 3.5–5.1)
Total Protein: 7.5 g/dL (ref 6.0–8.3)

## 2013-05-18 LAB — PROTIME-INR: INR: 0.99 (ref 0.00–1.49)

## 2013-05-18 MED ORDER — CALCITRIOL 0.25 MCG PO CAPS: 0.2500 ug | ORAL_CAPSULE | ORAL | Status: DC

## 2013-05-18 MED ORDER — CLONIDINE HCL 0.2 MG/24HR TD PTWK: 1.0000 | MEDICATED_PATCH | TRANSDERMAL | Status: DC

## 2013-05-18 MED ORDER — AMLODIPINE BESYLATE 10 MG PO TABS
10.0000 mg | ORAL_TABLET | Freq: Every day | ORAL | Status: DC
  Administered 2013-05-18: 10 mg via ORAL
  Filled 2013-05-18: qty 1

## 2013-05-18 MED ORDER — HYDRALAZINE HCL 50 MG PO TABS
100.0000 mg | ORAL_TABLET | Freq: Three times a day (TID) | ORAL | Status: DC
  Filled 2013-05-18 (×2): qty 2

## 2013-05-18 MED ORDER — AMLODIPINE BESYLATE 10 MG PO TABS: 10.0000 mg | ORAL_TABLET | Freq: Every day | ORAL | Status: DC

## 2013-05-18 MED ORDER — NITROGLYCERIN 0.4 MG SL SUBL: 0.4000 mg | SUBLINGUAL_TABLET | SUBLINGUAL | Status: DC | PRN

## 2013-05-18 MED ORDER — HYDRALAZINE HCL 25 MG PO TABS: 25.0000 mg | ORAL_TABLET | Freq: Three times a day (TID) | ORAL | Status: DC

## 2013-05-18 MED ORDER — CARVEDILOL 25 MG PO TABS: 25.0000 mg | ORAL_TABLET | Freq: Two times a day (BID) | ORAL | Status: DC

## 2013-05-18 MED ORDER — CAMPHOR-MENTHOL 0.5-0.5 % EX LOTN: TOPICAL_LOTION | CUTANEOUS | Status: DC | PRN

## 2013-05-18 MED ORDER — FUROSEMIDE 80 MG PO TABS: 80.0000 mg | ORAL_TABLET | Freq: Two times a day (BID) | ORAL | Status: DC

## 2013-05-18 MED ORDER — HYDRALAZINE HCL 100 MG PO TABS: 100.0000 mg | ORAL_TABLET | Freq: Three times a day (TID) | ORAL | Status: DC

## 2013-05-18 MED ORDER — LABETALOL HCL 200 MG PO TABS: 200.0000 mg | ORAL_TABLET | Freq: Three times a day (TID) | ORAL | Status: DC

## 2013-05-18 MED ORDER — CLONIDINE HCL 0.2 MG PO TABS: 0.2000 mg | ORAL_TABLET | Freq: Three times a day (TID) | ORAL | Status: DC

## 2013-05-18 MED ORDER — LABETALOL HCL 200 MG PO TABS
200.0000 mg | ORAL_TABLET | Freq: Three times a day (TID) | ORAL | Status: DC
  Filled 2013-05-18: qty 1

## 2013-05-18 MED ORDER — CLONIDINE HCL 0.2 MG/24HR TD PTWK
0.2000 mg | MEDICATED_PATCH | TRANSDERMAL | Status: DC
  Filled 2013-05-18: qty 1

## 2013-05-18 MED ORDER — NICOTINE 21 MG/24HR TD PT24: 1.0000 | MEDICATED_PATCH | Freq: Every day | TRANSDERMAL | Status: DC

## 2013-05-18 NOTE — Discharge Summary (Signed)
Physician Discharge Summary  [name] [id number] DOB: [birth year] DOA: 05/17/2013  PCP: No primary provider on file.  Admit date: 05/17/2013 Discharge date: 05/18/2013  Recommendations for Outpatient Follow-up:  1. Follow up with [facility] within 2 weeks 2. Follow up with vascular surgery in 5 weeks for fistula check, appointment scheduled for September  Discharge Diagnoses:  Principal Problem:   HTN (hypertension), malignant Active Problems:   DIASTOLIC HEART FAILURE, CHRONIC   H/O noncompliance with medical treatment, presenting hazards to health   Tobacco abuse   CAD (coronary artery disease)   CKD (chronic kidney disease), stage IV   Hypertensive heart disease   Discharge Condition: Stable, improved  Diet recommendation: Low-sodium healthy heart  Wt Readings from Last 3 Encounters:  05/17/13 61.2 kg (134 lb 14.7 oz)  05/14/13 62.5 kg (137 lb 12.6 oz)  05/14/13 62.5 kg (137 lb 12.6 oz)    History of present illness:  [name] is a 52 y.o. female with known history of severe HTN, s/p Spontaneous intracerebral hemorrhage, s/p [facility] 03/2002, required V-P Shunt, CAD, s/p NSTEMI 11/2007, s/p DES to RCA, CKD-4 (baseline creatinine 2.21-2.56 in 09/2012) who was just discharged 05/14/2013 for malignant hypertension with headache blurred vision/ AKI who presented back to the emergency room 05/17/2013 in her own words for itching on her hands, for which she apparently is disabled She, states she's also had a chronic headaches and has not been able to take her medications that she was prescribed [amlodipine 10 mg daily, hydralazine 100 3 times a day, labetalol 200 3 times a day, clonidine transdermal 0.2 mg q. 7 days  She complained of significant headache to the emergency room doctor and it was noted that her blood pressure was initially 234/84 in the emergency room.  She was given labetalol IV push 20 mg and her blood pressure came down after implementation of her  home medication regimen.  Labs show hyperkalemia 5.1, BUN 50, creatinine 5.09  Normal CBC, normal INR, glucose 90,  CT head shows stable appearance. No change from prior  . EKG shows flipped T waves in lateral leads, which is not new   Hospital Course:   Malignant hypertension: [name] was restarted on her previously prescribed home medications. She states that she had not been able to fill the prescriptions because she did not have enough money. She does not receive her paycheck and pelvis third of August. She states she understands that she needs to take her medications and that her sister will bring her the money to have prescriptions filled to bridge her until she can fill them in August.  I offered her medications from the [employer], however it is less expensive for her to fill her prescriptions using her Medicaid.  Her headache resolved when her blood pressure decreased. Her head CT was negative.  Tobacco abuse, counseled cessation. Continue nicotine patches.  Coronary artery disease, currently chest pain-free.-Stable T-wave inversions in the lateral leads and a negative troponin. She should continue her beta blocker. She is not on an ACE inhibitor secondary to her progressive CKD.  She is not on aspirin secondary to her somewhat recent subarachnoid hemorrhage, but she does have history of drug eluting stent to the RCA.   Chronic kidney disease, stage V, fistula placed a week ago. She is followed by [facility]. Her creatinine is relatively stable to what it was earlier this month. Her hyperkalemia was likely spurious secondary to hemolysis and was normal on repeat check.  She did not have a metabolic acidosis.  Diastolic heart failure, chest x-ray was stable.  She did not require oxygen or have shortness of breath.  History of subarachnoid hemorrhage, head CT was stable.    Procedures:  Head CT  Consultations:  None  Discharge Exam: Filed Vitals:    05/18/13 1325  BP: 127/61  Pulse: 66  Temp: 97.8 F (36.6 C)  Resp: 18   Filed Vitals:   05/17/13 2334 05/18/13 0548 05/18/13 0953 05/18/13 1325  BP: 176/73 158/81 167/90 127/61  Pulse: 67 59  66  Temp: 97.8 F (36.6 C) 98 F (36.7 C)  97.8 F (36.6 C)  TempSrc: Oral Oral  Oral  Resp: 20 18  18   Height:      Weight:      SpO2: 100% 100%  99%    General: African American female, no acute distress HEENT: Normocephalic atraumatic, moist membranes Cardiovascular: Regular rate and rhythm, no murmurs, rubs, gallops. 2+ pulses, warm extremities Respiratory: Clear to auscultation bilaterally, no increased work of breathing Abdomen: Normal active bowel sounds, nondistended, nontender MSK: No lower extremity edema, right arm fistula dressing clean, dry, intact  Discharge Instructions      Discharge Orders   Future Appointments Provider Department Dept Phone   07/12/2013 9:30 AM [facility] [phone]   07/12/2013 10:30 AM Rosetta Posner, MD [facility] [phone]   Future Orders Complete By Expires     (HEART FAILURE PATIENTS) Call MD:  Anytime you have any of the following symptoms: 1) 3 pound weight gain in 24 hours or 5 pounds in 1 week 2) shortness of breath, with or without a dry hacking cough 3) swelling in the hands, feet or stomach 4) if you have to sleep on extra pillows at night in order to breathe.  As directed     Call MD for:  difficulty breathing, headache or visual disturbances  As directed     Call MD for:  extreme fatigue  As directed     Call MD for:  hives  As directed     Call MD for:  persistant dizziness or light-headedness  As directed     Call MD for:  persistant nausea and vomiting  As directed     Call MD for:  redness, tenderness, or signs of infection (pain, swelling, redness, odor or green/yellow discharge around incision site)  As directed     Call MD for:  severe uncontrolled  pain  As directed     Call MD for:  temperature >100.4  As directed     Diet - low sodium heart healthy  As directed     Discharge instructions  As directed     Comments:      You were hospitalized with high blood pressure.  You are at risk of having bleeding in and around your brain because of high blood pressure. It is very important that you take your blood pressure medications, and work out something with your family in order for you to get your medications regularly. If you do not take your medications regularly, it is highly likely that you will need to be rehospitalized or have serious medical complications. I have given you new prescriptions for your medications to take to any local pharmacy for one month supply. Please fill these immediately and take your next dose of medications this evening.  Please call the phone number for [facility] to  schedule an appointment with them for within the next 2 weeks. You have already had an appointment scheduled with the vascular surgeons in September as followup for your fistula placement.    Increase activity slowly  As directed         Medication List         amLODipine 10 MG tablet  Commonly known as:  NORVASC  Take 1 tablet (10 mg total) by mouth daily.     calcitRIOL 0.25 MCG capsule  Commonly known as:  ROCALTROL  Take 1 capsule (0.25 mcg total) by mouth every Monday, Wednesday, and Friday.     camphor-menthol lotion  Commonly known as:  SARNA  Apply topically as needed for itching.     cloNIDine 0.2 mg/24hr patch  Commonly known as:  CATAPRES - Dosed in mg/24 hr  Place 1 patch (0.2 mg total) onto the skin once a week.     furosemide 80 MG tablet  Commonly known as:  LASIX  Take 1 tablet (80 mg total) by mouth 2 (two) times daily.     hydrALAZINE 100 MG tablet  Commonly known as:  APRESOLINE  Take 1 tablet (100 mg total) by mouth 3 (three) times daily.     labetalol 200 MG tablet  Commonly known as:  NORMODYNE   Take 1 tablet (200 mg total) by mouth 3 (three) times daily.     nicotine 21 mg/24hr patch  Commonly known as:  NICODERM CQ - dosed in mg/24 hours  Place 1 patch onto the skin daily.     nitroGLYCERIN 0.4 MG SL tablet  Commonly known as:  NITROSTAT  Place 1 tablet (0.4 mg total) under the tongue every 5 (five) minutes as needed for chest pain.       Follow-up Information   Follow up with [facility]. Schedule an appointment as soon as possible for a visit in 2 weeks.   Contact information:   [place] [phone] [phone]       The results of significant diagnostics from this hospitalization (including imaging, microbiology, ancillary and laboratory) are listed below for reference.    Significant Diagnostic Studies: Ct Head Wo Contrast  05/17/2013   *RADIOLOGY REPORT*  Clinical Data: Headache, hypertension, history of intracranial aneurysm.  CT HEAD WITHOUT CONTRAST  Technique:  Contiguous axial images were obtained from the base of the skull through the vertex without contrast. The examination is not limited by patient motion artifact.  Comparison: 05/06/2013  Findings: The ventriculostomy catheter is again identified and stable.  No significant ventricular dilatation is noted.  There are changes consistent with prior aneurysm clips and coils. Postsurgical changes are noted within the right calvarium.  No findings to suggest acute hemorrhage, acute infarction or space- occupying mass lesion are noted.  IMPRESSION: Stable appearance.  No change from prior exam.   Original Report Authenticated By: Inez Catalina, M.D.   Ct Head Wo Contrast  05/06/2013   *RADIOLOGY REPORT*  Clinical Data: Headache  CT HEAD WITHOUT CONTRAST  Technique:  Contiguous axial images were obtained from the base of the skull through the vertex without contrast.  Comparison: 09/25/2012  Findings: No evidence for an acute hemorrhage.  No hydrocephalus, midline shift, or abnormal  extra-axial fluid collection.  No CT evidence for acute ischemia.  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  A right sided aneurysm clip is seen again.  The right frontal ventriculostomy shunt catheter is again noted  with stable tip position.  Inferior left cerebellar infarct again noted.  Postsurgical change noted in the right frontotemporal skull.  IMPRESSION: Stable.  No new or acute interval findings.   Original Report Authenticated By: Misty Stanley, M.D.   US Renal  05/06/2013   *RADIOLOGY REPORT*  Clinical Data: Acute renal failure superimposed on chronic kidney disease.  RENAL/URINARY TRACT ULTRASOUND COMPLETE  Comparison:  None.  Findings:  Right Kidney:  Right kidney shows diffusely increased parenchymal echogenicity.  It measures 9.8 cm in length.  No right renal mass or stone or hydronephrosis.  Left Kidney:  Left kidney is increased in echogenicity diffusely. There are to lower pole cyst seen laterally, one measuring 9 mm and the other measuring 14 mm in size.  The left kidney measures 9.9 cm in length.  Bladder:  The bladder is unremarkable.  IMPRESSION: Increased renal parenchymal echogenicity bilaterally is consistent with medical renal disease.  No hydronephrosis.  Two small left renal cysts.   Original Report Authenticated By: Lajean Manes, M.D.   Dg Chest Port 1 View  05/07/2013   *RADIOLOGY REPORT*  Clinical Data: CHF  PORTABLE CHEST - 1 VIEW  Comparison: Prior radiograph from 05/06/2013  Findings:  VP shunt catheter tubing is again seen coursing over the right anterior chest.  Cardiomegaly is not significantly changed as compared to the prior examination.  The lungs are mildly hypoinflated.  Diffuse prominence of the interstitial markings with a few scattered Kerley lines are not significantly changed as compared to the prior study. There is question of a left pleural effusion.  No pneumothorax.  No soft tissues and osseous structures are unchanged.  IMPRESSION: Stable  cardiomegaly and mild interstitial pulmonary edema, consistent with CHF.   Original Report Authenticated By: Jeannine Boga, M.D.   Dg Chest Port 1 View  05/06/2013   *RADIOLOGY REPORT*  Clinical Data: Shortness of breath.  Current history of hypertension.  Current smoker.  PORTABLE CHEST - 1 VIEW 05/06/2013 1419 hours:  Comparison: Portable chest x-ray 04/30/2012 dating back to 03/05/2012.  Findings: Cardiac silhouette moderately enlarged but stable. Pulmonary venous hypertension and scattered Kerley B lines consistent with minimal interstitial pulmonary edema.  No visible pleural effusions.  No confluent airspace consolidation. Ventriculoperitoneal shunt catheter overlies the right neck, chest, and upper abdomen.  IMPRESSION: Stable moderate cardiomegaly.  Minimal interstitial pulmonary edema consistent with CHF.   Original Report Authenticated By: Evangeline Dakin, M.D.    Microbiology: Recent Results (from the past 240 hour(s))  MRSA PCR SCREENING     Status: None   Collection Time    05/17/13 10:35 PM      Result Value Range Status   MRSA by PCR NEGATIVE  NEGATIVE Final   Comment:            The GeneXpert MRSA Assay (FDA     approved for NASAL specimens     only), is one component of a     comprehensive MRSA colonization     surveillance program. It is not     intended to diagnose MRSA     infection nor to guide or     monitor treatment for     MRSA infections.     Labs: Basic Metabolic Panel:  Recent Labs Lab 05/12/13 0520 05/13/13 0547 05/14/13 0510 05/17/13 2010 05/18/13 0457  NA 140 138 137 137  137 138  K 3.5 3.5 3.6 5.1  5.2* 3.8  CL 104 102 100 96  94* 99  CO2 24 24  24 24  22 24   GLUCOSE 86 95 97 90  91 102*  BUN 34* 37* 40* 50*  51* 54*  CREATININE 5.19* 5.18* 5.44* 5.09*  5.06* 5.27*  CALCIUM 10.1 10.2 9.8 10.6*  10.7* 10.4  PHOS  --   --   --  4.2  --    Liver Function Tests:  Recent Labs Lab 05/17/13 2010 05/18/13 0457  AST 23 17  ALT 12  8  ALKPHOS 84 71  BILITOT 0.4 0.3  PROT 8.4* 7.5  ALBUMIN 4.5  4.5 3.9   No results found for this basename: LIPASE, AMYLASE,  in the last 168 hours No results found for this basename: AMMONIA,  in the last 168 hours CBC:  Recent Labs Lab 05/13/13 0547 05/17/13 2010 05/18/13 0457  WBC 4.8 6.5 5.2  NEUTROABS  --  5.0  --   HGB 12.1 12.8 11.5*  HCT 34.6* 38.7 35.1*  MCV 83.2 83.8 83.8  PLT 252 262 242   Cardiac Enzymes:  Recent Labs Lab 05/17/13 2010  TROPONINI <0.30   BNP: BNP (last 3 results)  Recent Labs  09/25/12 1333 05/06/13 1410 05/07/13 0500  PROBNP 4012.0* [id number]* 20527.0*   CBG: No results found for this basename: GLUCAP,  in the last 168 hours  Time coordinating discharge: 45 minutes  Signed:  Burley Kopka  [facility] 05/18/2013, 4:22 PM

## 2013-05-18 NOTE — Care Management Note (Signed)
    Page 1 of 1   05/18/2013     2:28:31 PM   CARE MANAGEMENT NOTE 05/18/2013  Patient:  MAIRI, KWIAT   Account Number:  [id number]  Date Initiated:  05/18/2013  Documentation initiated by:  Dessa Phi  Subjective/Objective Assessment:   ADMITTED W/VOMITING,HEADACHE,HTN [id number].[id number] FAILURE,R AV SHUNT.     Action/Plan:   FROM HOME W/FAMILY.HAS PCP,PHARMACY.   Anticipated DC Date:  05/19/2013   Anticipated DC Plan:  [place]  CM consult      Choice offered to / List presented to:             Status of service:  In process, will continue to follow Medicare Important Message given?   (If response is "NO", the following Medicare IM given date fields will be blank) Date Medicare IM given:   Date Additional Medicare IM given:    Discharge Disposition:    Per UR Regulation:  Reviewed for med. necessity/level of care/duration of stay  If discussed at Long Length of Stay Meetings, dates discussed:    Comments:  05/18/13 Jeno Calleros RN,BSN NCM [place] MED ASST.PATIENT HAS PRESCRIPTION COVERAGE, & HAS MEDICAID W/MINIMAL CO-PAY.DOES NOT QUALIFY FOR MED ASST SINCE SHE HAS SCRIPT COVERAGE.$4 [facility] MED LIST PROVIDED.

## 2013-05-24 ENCOUNTER — Emergency Department (HOSPITAL_COMMUNITY): Payer: No Typology Code available for payment source

## 2013-05-24 ENCOUNTER — Encounter (HOSPITAL_COMMUNITY): Payer: Self-pay | Admitting: Nurse Practitioner

## 2013-05-24 ENCOUNTER — Emergency Department (HOSPITAL_COMMUNITY)
Admission: EM | Admit: 2013-05-24 | Discharge: 2013-05-24 | Disposition: A | Discharge status: Home / Self Care | Payer: No Typology Code available for payment source | Attending: Emergency Medicine | Admitting: Emergency Medicine

## 2013-05-24 DIAGNOSIS — N189 Chronic kidney disease, unspecified: Secondary | ICD-10-CM | POA: Insufficient documentation

## 2013-05-24 DIAGNOSIS — E7800 Pure hypercholesterolemia, unspecified: Secondary | ICD-10-CM | POA: Insufficient documentation

## 2013-05-24 DIAGNOSIS — R110 Nausea: Secondary | ICD-10-CM | POA: Insufficient documentation

## 2013-05-24 DIAGNOSIS — Z8679 Personal history of other diseases of the circulatory system: Secondary | ICD-10-CM | POA: Insufficient documentation

## 2013-05-24 DIAGNOSIS — I2510 Atherosclerotic heart disease of native coronary artery without angina pectoris: Secondary | ICD-10-CM | POA: Insufficient documentation

## 2013-05-24 DIAGNOSIS — Z79899 Other long term (current) drug therapy: Secondary | ICD-10-CM | POA: Insufficient documentation

## 2013-05-24 DIAGNOSIS — I129 Hypertensive chronic kidney disease with stage 1 through stage 4 chronic kidney disease, or unspecified chronic kidney disease: Secondary | ICD-10-CM | POA: Insufficient documentation

## 2013-05-24 DIAGNOSIS — F17200 Nicotine dependence, unspecified, uncomplicated: Secondary | ICD-10-CM | POA: Insufficient documentation

## 2013-05-24 DIAGNOSIS — H53149 Visual discomfort, unspecified: Secondary | ICD-10-CM | POA: Insufficient documentation

## 2013-05-24 DIAGNOSIS — R51 Headache: Secondary | ICD-10-CM | POA: Insufficient documentation

## 2013-05-24 MED ORDER — METOCLOPRAMIDE HCL 5 MG/ML IJ SOLN: 10.0000 mg | Freq: Once | INTRAMUSCULAR | Status: DC

## 2013-05-24 MED ORDER — METOCLOPRAMIDE HCL 5 MG/ML IJ SOLN
10.0000 mg | Freq: Once | INTRAMUSCULAR | Status: AC
  Administered 2013-05-24: 10 mg via INTRAMUSCULAR
  Filled 2013-05-24: qty 2

## 2013-05-24 MED ORDER — DIPHENHYDRAMINE HCL 50 MG/ML IJ SOLN: 25.0000 mg | Freq: Once | INTRAMUSCULAR | Status: DC

## 2013-05-24 MED ORDER — DIPHENHYDRAMINE HCL 50 MG/ML IJ SOLN
25.0000 mg | Freq: Once | INTRAMUSCULAR | Status: AC
  Administered 2013-05-24: 25 mg via INTRAMUSCULAR
  Filled 2013-05-24: qty 1

## 2013-05-24 MED ORDER — HYDRALAZINE HCL 50 MG PO TABS
100.0000 mg | ORAL_TABLET | Freq: Once | ORAL | Status: AC
  Administered 2013-05-24: 100 mg via ORAL
  Filled 2013-05-24: qty 2

## 2013-05-24 NOTE — ED Notes (Signed)
Pt sleeping in room. NAD. Door to room is open.

## 2013-05-24 NOTE — ED Notes (Signed)
Pt with sudden severe headache and nausea this am while at PCP appt, he told her to come straight here for evaluation. Pt with history of brain aneurysm that was clipped in 1996 and recent new aneurysm that "They are watching." pt denies vision changes or loc. She is A&Ox4 now, PERRLA, breathing easily

## 2013-05-24 NOTE — ED Notes (Addendum)
Pt states she has been having trouble swallowing after swallowing hydralazine without difficulty. Will inform PA/MD.

## 2013-05-24 NOTE — ED Notes (Signed)
Called pharmacy for medications.  

## 2013-05-24 NOTE — ED Notes (Signed)
Pt ambulated with family out of ED. Pt did not complain of pain. NAD. AVS reviewed. Care Manager reviewed home health information with pt and daughter.

## 2013-05-24 NOTE — ED Provider Notes (Signed)
Medical screening examination/treatment/procedure(s) were conducted as a shared visit with non-physician practitioner(s) and myself.  I personally evaluated the patient during the encounter.  Pt presents w/ sudden onset h/a and hx of aneurysm, SAH.  Pt well appearing on exam, withouth focal neuro findings.  VSS. She responded well to IM meds.  CT unremarkable.  Given timing of onset, I do not feel further imaging needed and I doubt new spontaneous hemorrhage.  SW has seen pt to help family with outpt treatment, home health eval ordered.  Pt has agreed to f/u with neurology as outpt.     Neta Ehlers, MD 05/24/13 [phone]

## 2013-05-24 NOTE — Progress Notes (Signed)
8/5/214 1610 W. Stann Mainland RN BSN ED CM consulted. Pt present to ED with headache and  HTN urgency. history of severe HTN, s/p  who was just discharged 05/14/2013 for malignant hypertension with headache blurred vision/ AKI. Pt has ESRD. Then she   was seen in  the emergency room 05/17/2013  for headache and itching on her hands. In to speak with Patient and daughters, Patient and family expressed the need for disease and medication management assistance at home. Pt states she is about to start Dialysis and needs some help managing her medicines. Pt states,  dialysis is going to be a major change in her lifestyle. Discussed HC as an option, patient and family in agreement.  choice given. Provided a printed list of [facility] Agencies. Pt and Family desires [place].  Spoke with Dr. Tawnya Crook, in regards to the discharge plan. HC Orders placed. Referral called in to [facility] [place]. Verified Current Address and phone number in which services will be rendered with patient and family for [facility] agency. Explained to patient and family that RN from [place] will call to set up visit with 24-48 hours. Pt and family in agreement. Also discussed the importance of a PCP.  Pt given written information and phone number for the [facility] and [facility] for follow up. Pt verbalizes understanding. No further CM needs identified at present.  Marland Kitchen

## 2013-05-24 NOTE — ED Provider Notes (Signed)
CSN: [id number]     Arrival date & time 05/24/13  1021 History     First MD Initiated Contact with Patient 05/24/13 1041     Chief Complaint  Patient presents with  . Headache   (Consider location/radiation/quality/duration/timing/severity/associated sxs/prior Treatment) HPI Comments: 52 year old female with past medical history of hypertension, intracranial hemorrhage, heart failure, CAD and subarachnoid hemorrhage in 2013 presents to the emergency department with her daughter complaining of sudden onset severe headache while sitting in the waiting room at her nephrologists office today. Patient herself is a poor historian. Admits to associated photophobia and nausea without vomiting. Nephrologists office advised her to the emergency department because she has a history of brain aneurysm. Headache located in the back of her head described as "pounding and severe". States she frequently gets headaches, however this is "very severe". Denies dizziness, blurred vision, confusion. She was discharged from the hospital on July 29 after being admitted for heart failure. She did have a CT of her head on July 29 which did not show any new findings. Daughter is stating that she is very frustrated that patient continues to be sent home after being admitted to the hospital, she is "sick of going to [employer] long, may as well as drive all the way [place]". She feels as if her mom should be discharged home with home health aid, her mom's health is "interfering with her daily life, has kids that she eats to care for and is sick of having to give mom medications because she cannot care for herself".  Patient is a 52 y.o. female presenting with headaches. The history is provided by the patient and a relative.  Headache Associated symptoms: nausea and photophobia   Associated symptoms: no back pain, no dizziness, no fever, no neck pain, no neck stiffness and no vomiting     Past Medical History  Diagnosis  Date  . HTN (hypertension)     resistant  . Hypercholesterolemia   . Tobacco abuse     resumed smoking half a pack a day. She was a smoker in the past and states she was abl eto stop in the past using nicotine patches  . CAD (coronary artery disease)     Pt has St elevation MI in Feb 2009. Left hear tcath at tha ttime showed a 70% distal LAD lesion that was hazy consistent with a plaque rupture. She had a 50% distal circumflex stenosis an a 95% distal RCA stenosis. She did havve drug eluting stent placed in her distal RCA  . Chronic kidney disease     most recent creatinine 1.4 in 3/10  . Intracranial hemorrhage, spontaneous intraparenchymal, idiopathic, remote, resolved   . Diastolic heart failure     pt most recent echo was in Feb 2009 w an EF of 60% and severe left ventricular hypertrophy. The pt did have evidence of diastolic dysfunction. The RV appeared normal  . Subarachnoid hemorrhage     03/2012  . Aneurysm    Past Surgical History  Procedure Laterality Date  . Ventriculoperitoneal shunt  03/27/2012    Procedure: SHUNT INSERTION VENTRICULAR-PERITONEAL;  Surgeon: Winfield Cunas, MD;  Location: [place] [facility];  Service: Neurosurgery;  Laterality: N/A;  Ventricular-Peritoneal Shunt Insertion  . Cardiac catheterization    . Coronary angioplasty    . Abdominal hysterectomy    . Bascilic vein transposition Right 05/13/2013    Procedure: [place];  Surgeon: Rosetta Posner, MD;  Location: [place];  Service: Vascular;  Laterality: Right;   Family History  Problem Relation Age of Onset  . Coronary artery disease Other     premature in 1st degree relatives   History  Substance Use Topics  . Smoking status: Current Every Day Smoker -- 0.20 packs/day for 35 years    Types: Cigarettes  . Smokeless tobacco: Never Used     Comment: 1/2 ppd   . Alcohol Use: No   OB History   Grav Para Term Preterm Abortions TAB SAB Ect Mult Living                 Review of Systems   Constitutional: Negative for fever and chills.  HENT: Negative for neck pain and neck stiffness.   Eyes: Positive for photophobia. Negative for visual disturbance.  Respiratory: Negative for shortness of breath.   Cardiovascular: Negative for chest pain.  Gastrointestinal: Positive for nausea. Negative for vomiting.  Musculoskeletal: Negative for back pain.  Neurological: Positive for headaches. Negative for dizziness, weakness and light-headedness.  All other systems reviewed and are negative.    Allergies  Ampicillin and Penicillins  Home Medications   Current Outpatient Rx  Name  Route  Sig  Dispense  Refill  . acetaminophen (TYLENOL) 325 MG tablet   Oral   Take 650 mg by mouth every 6 (six) hours as needed for pain (headache).         Marland Kitchen amLODipine (NORVASC) 10 MG tablet   Oral   Take 1 tablet (10 mg total) by mouth daily.   30 tablet   0   . calcitRIOL (ROCALTROL) 0.25 MCG capsule   Oral   Take 1 capsule (0.25 mcg total) by mouth every Monday, Wednesday, and Friday.   30 capsule   0   . camphor-menthol (SARNA) lotion   Topical   Apply topically as needed for itching.   222 mL   0   . cloNIDine (CATAPRES - DOSED IN MG/24 HR) 0.2 mg/24hr patch   Transdermal   Place 1 patch (0.2 mg total) onto the skin once a week.   10 patch   0   . furosemide (LASIX) 80 MG tablet   Oral   Take 1 tablet (80 mg total) by mouth 2 (two) times daily.   60 tablet   0   . hydrALAZINE (APRESOLINE) 100 MG tablet   Oral   Take 1 tablet (100 mg total) by mouth 3 (three) times daily.   90 tablet   0   . labetalol (NORMODYNE) 200 MG tablet   Oral   Take 1 tablet (200 mg total) by mouth 3 (three) times daily.   90 tablet   0   . nitroGLYCERIN (NITROSTAT) 0.4 MG SL tablet   Sublingual   Place 1 tablet (0.4 mg total) under the tongue every 5 (five) minutes as needed for chest pain.   20 tablet   0    BP 166/86  Pulse 69  Temp(Src) 97.6 F (36.4 C) (Oral)  Resp 22   SpO2 100% Physical Exam  Nursing note and vitals reviewed. Constitutional: She is oriented to person, place, and time. She appears well-developed and well-nourished. No distress.  HENT:  Head: Normocephalic and atraumatic.  Mouth/Throat: Oropharynx is clear and moist.  Eyes: Conjunctivae and EOM are normal. Pupils are equal, round, and reactive to light.  Neck: Normal range of motion. Neck supple. No spinous process tenderness and no muscular tenderness present. No Brudzinski's sign and no Kernig's sign noted.  Cardiovascular: Normal  rate, regular rhythm, normal heart sounds, intact distal pulses and normal pulses.   Pulmonary/Chest: Effort normal and breath sounds normal. She has no decreased breath sounds. She has no wheezes. She has no rhonchi. She has no rales.  Abdominal: Soft. Normal appearance and bowel sounds are normal. There is no tenderness.  Musculoskeletal: Normal range of motion. She exhibits no edema.  Neurological: She is alert and oriented to person, place, and time. She has normal strength. No cranial nerve deficit or sensory deficit. Coordination normal. GCS eye subscore is 4. GCS verbal subscore is 5. GCS motor subscore is 6.  Skin: Skin is warm and dry. She is not diaphoretic.  Psychiatric: She has a normal mood and affect. Her speech is normal and behavior is normal.    ED Course   Procedures (including critical care time)  Labs Reviewed - No data to display Ct Head Wo Contrast  05/24/2013   *RADIOLOGY REPORT*  Clinical Data: Acute onset of severe headache associated with nausea earlier this morning.  Prior history of intracranial aneurysm clipping, aneurysm coiling, and a new aneurysm which is currently being followed.  Indwelling ventriculoperitoneal shunt.  CT HEAD WITHOUT CONTRAST  Technique:  Contiguous axial images were obtained from the base of the skull through the vertex without contrast.  Comparison: Multiple prior CT head examinations dating back to 06/22/2010,  most recently 05/17/2013.  Findings: Metallic beam hardening streak artifact from the aneurysm clip in the right middle cranial fossa (presumably MCA aneurysm) and from the aneurysm coils above the third ventricle in the midline (presumably anterior communicating artery aneurysm). Ventriculoperitoneal shunt catheter entering from a right frontal approach traversing through the right lateral ventricle and third ventricle, with its tip in the posterior left lateral ventricle. No evidence of hydrocephalus.  No acute hemorrhage or hematoma.  No extra-axial fluid collections. No new focal brain parenchymal abnormalities.  No new focal abnormalities involving the skull.  Tiny mucous retention cyst or polyp within a solitary middle right ethmoid air cell.  Remaining visualized paranasal sinuses, bilateral mastoid air cells, and bilateral middle ear cavities well-aerated. Bilateral carotid siphon atherosclerosis.  IMPRESSION:  1.  No acute intracranial abnormality. 2.  Ventriculoperitoneal shunt catheter with no evidence of hydrocephalus.   Original Report Authenticated By: Evangeline Dakin, M.D.   1. Headache     MDM  Patient with sudden onset headache and nausea. Hx of aneurysm, SAH. She appears in NAD, poor historian. Daughter very agitated, would like her mom "to stop being discharged home without help".  Recent CT scan on 7/29 of patient's head did now have any significant findings. Will re-scan due to nature of onset. Benadryl, reglan for headache.  1:53 PM CT scan without any acute abnormality. Headache resolved after receiving IM Benadryl and Reglan. She feels as if she can go home. States she does have a neurologist, however has not seen him "in a long time". Referral to neurologist given. Will consult case management regarding daughters concern for home health needs. Will Patient also evaluated by Dr. Tawnya Crook who agrees with care plan. 3:21 PM Case management spoke with patient and family, resources  given. She is stable for discharge. Illene Labrador, PA-C 05/24/13 [place], PA-C 05/24/13 [phone]

## 2013-06-01 ENCOUNTER — Telehealth: Payer: Self-pay | Admitting: General Practice

## 2013-06-01 NOTE — Telephone Encounter (Signed)
Called pt 05/26/13 and 06/01/13, wrong number.

## 2013-06-24 ENCOUNTER — Other Ambulatory Visit: Payer: Self-pay | Admitting: *Deleted

## 2013-06-24 DIAGNOSIS — N186 End stage renal disease: Secondary | ICD-10-CM

## 2013-06-24 DIAGNOSIS — Z4931 Encounter for adequacy testing for hemodialysis: Secondary | ICD-10-CM

## 2013-07-11 ENCOUNTER — Encounter: Payer: Self-pay | Admitting: Vascular Surgery

## 2013-07-12 ENCOUNTER — Encounter: Payer: No Typology Code available for payment source | Admitting: Vascular Surgery

## 2013-09-25 ENCOUNTER — Emergency Department (HOSPITAL_COMMUNITY): Payer: No Typology Code available for payment source

## 2013-09-25 ENCOUNTER — Inpatient Hospital Stay (HOSPITAL_COMMUNITY)
Admission: EM | Admit: 2013-09-25 | Discharge: 2013-09-27 | DRG: 683 | Disposition: A | Discharge status: Home / Self Care | Payer: No Typology Code available for payment source | Attending: Internal Medicine | Admitting: Internal Medicine

## 2013-09-25 ENCOUNTER — Encounter (HOSPITAL_COMMUNITY): Payer: Self-pay | Admitting: Emergency Medicine

## 2013-09-25 ENCOUNTER — Inpatient Hospital Stay (HOSPITAL_COMMUNITY): Payer: No Typology Code available for payment source

## 2013-09-25 DIAGNOSIS — I509 Heart failure, unspecified: Secondary | ICD-10-CM | POA: Diagnosis present

## 2013-09-25 DIAGNOSIS — A599 Trichomoniasis, unspecified: Secondary | ICD-10-CM

## 2013-09-25 DIAGNOSIS — Z9861 Coronary angioplasty status: Secondary | ICD-10-CM

## 2013-09-25 DIAGNOSIS — R748 Abnormal levels of other serum enzymes: Secondary | ICD-10-CM | POA: Diagnosis present

## 2013-09-25 DIAGNOSIS — Z9119 Patient's noncompliance with other medical treatment and regimen: Secondary | ICD-10-CM

## 2013-09-25 DIAGNOSIS — I5030 Unspecified diastolic (congestive) heart failure: Secondary | ICD-10-CM | POA: Diagnosis present

## 2013-09-25 DIAGNOSIS — I10 Essential (primary) hypertension: Secondary | ICD-10-CM

## 2013-09-25 DIAGNOSIS — H3560 Retinal hemorrhage, unspecified eye: Secondary | ICD-10-CM | POA: Diagnosis present

## 2013-09-25 DIAGNOSIS — H539 Unspecified visual disturbance: Secondary | ICD-10-CM | POA: Diagnosis present

## 2013-09-25 DIAGNOSIS — I120 Hypertensive chronic kidney disease with stage 5 chronic kidney disease or end stage renal disease: Principal | ICD-10-CM | POA: Diagnosis present

## 2013-09-25 DIAGNOSIS — R197 Diarrhea, unspecified: Secondary | ICD-10-CM

## 2013-09-25 DIAGNOSIS — I252 Old myocardial infarction: Secondary | ICD-10-CM

## 2013-09-25 DIAGNOSIS — Z982 Presence of cerebrospinal fluid drainage device: Secondary | ICD-10-CM

## 2013-09-25 DIAGNOSIS — Z9189 Other specified personal risk factors, not elsewhere classified: Secondary | ICD-10-CM

## 2013-09-25 DIAGNOSIS — Z8673 Personal history of transient ischemic attack (TIA), and cerebral infarction without residual deficits: Secondary | ICD-10-CM

## 2013-09-25 DIAGNOSIS — N185 Chronic kidney disease, stage 5: Secondary | ICD-10-CM

## 2013-09-25 DIAGNOSIS — Z8249 Family history of ischemic heart disease and other diseases of the circulatory system: Secondary | ICD-10-CM

## 2013-09-25 DIAGNOSIS — I214 Non-ST elevation (NSTEMI) myocardial infarction: Secondary | ICD-10-CM

## 2013-09-25 DIAGNOSIS — N186 End stage renal disease: Secondary | ICD-10-CM | POA: Diagnosis present

## 2013-09-25 DIAGNOSIS — R7989 Other specified abnormal findings of blood chemistry: Secondary | ICD-10-CM

## 2013-09-25 DIAGNOSIS — E7800 Pure hypercholesterolemia, unspecified: Secondary | ICD-10-CM | POA: Diagnosis present

## 2013-09-25 DIAGNOSIS — F17200 Nicotine dependence, unspecified, uncomplicated: Secondary | ICD-10-CM | POA: Diagnosis present

## 2013-09-25 DIAGNOSIS — Z91199 Patient's noncompliance with other medical treatment and regimen due to unspecified reason: Secondary | ICD-10-CM

## 2013-09-25 DIAGNOSIS — I5033 Acute on chronic diastolic (congestive) heart failure: Secondary | ICD-10-CM | POA: Diagnosis present

## 2013-09-25 DIAGNOSIS — I2510 Atherosclerotic heart disease of native coronary artery without angina pectoris: Secondary | ICD-10-CM | POA: Diagnosis present

## 2013-09-25 LAB — URINALYSIS, ROUTINE W REFLEX MICROSCOPIC
Bilirubin Urine: NEGATIVE
Glucose, UA: NEGATIVE mg/dL
Protein, ur: >300 mg/dL — AB
Specific Gravity, Urine: 1.016 (ref 1.005–1.030)
Urobilinogen, UA: 0.2 mg/dL (ref 0.0–1.0)

## 2013-09-25 LAB — COMPREHENSIVE METABOLIC PANEL
ALT: 282 U/L — ABNORMAL HIGH (ref 0–35)
AST: 116 U/L — ABNORMAL HIGH (ref 0–37)
Alkaline Phosphatase: 167 U/L — ABNORMAL HIGH (ref 39–117)
CO2: 15 mEq/L — ABNORMAL LOW (ref 19–32)
GFR calc Af Amer: 13 mL/min — ABNORMAL LOW (ref >90)
GFR calc non Af Amer: 12 mL/min — ABNORMAL LOW (ref >90)
Glucose, Bld: 91 mg/dL (ref 70–99)
Potassium: 3.5 mEq/L (ref 3.5–5.1)
Sodium: 136 mEq/L (ref 135–145)

## 2013-09-25 LAB — URINE MICROSCOPIC-ADD ON

## 2013-09-25 LAB — CBC WITH DIFFERENTIAL/PLATELET
Basophils Absolute: 0 10*3/uL (ref 0.0–0.1)
Lymphocytes Relative: 29 % (ref 12–46)
Lymphs Abs: 1.7 10*3/uL (ref 0.7–4.0)
Neutro Abs: 3.7 10*3/uL (ref 1.7–7.7)
Neutrophils Relative %: 63 % (ref 43–77)
Platelets: 198 10*3/uL (ref 150–400)
RBC: 3.88 MIL/uL (ref 3.87–5.11)
WBC: 5.9 10*3/uL (ref 4.0–10.5)

## 2013-09-25 LAB — PHOSPHORUS: Phosphorus: 4.7 mg/dL — ABNORMAL HIGH (ref 2.3–4.6)

## 2013-09-25 LAB — AMMONIA: Ammonia: 26 umol/L (ref 11–60)

## 2013-09-25 LAB — PROTIME-INR
INR: 1.48 (ref 0.00–1.49)
Prothrombin Time: 17.5 s — ABNORMAL HIGH (ref 11.6–15.2)

## 2013-09-25 LAB — MRSA PCR SCREENING: MRSA by PCR: NEGATIVE

## 2013-09-25 LAB — TROPONIN I: Troponin I: 0.35 ng/mL

## 2013-09-25 LAB — MAGNESIUM: Magnesium: 2.3 mg/dL (ref 1.5–2.5)

## 2013-09-25 MED ORDER — AMLODIPINE BESYLATE 10 MG PO TABS
10.0000 mg | ORAL_TABLET | Freq: Every day | ORAL | Status: DC
  Administered 2013-09-25 – 2013-09-27 (×3): 10 mg via ORAL
  Filled 2013-09-25 (×3): qty 1

## 2013-09-25 MED ORDER — METRONIDAZOLE 500 MG PO TABS
2000.0000 mg | ORAL_TABLET | Freq: Once | ORAL | Status: AC
  Administered 2013-09-25: 2000 mg via ORAL
  Filled 2013-09-25: qty 4

## 2013-09-25 MED ORDER — LABETALOL HCL 200 MG PO TABS
200.0000 mg | ORAL_TABLET | Freq: Three times a day (TID) | ORAL | Status: DC
  Administered 2013-09-25: 200 mg via ORAL
  Filled 2013-09-25 (×2): qty 1

## 2013-09-25 MED ORDER — NITROGLYCERIN IN D5W 200-5 MCG/ML-% IV SOLN: 2.0000 ug/min | INTRAVENOUS | Status: DC

## 2013-09-25 MED ORDER — ISOSORBIDE MONONITRATE ER 30 MG PO TB24
30.0000 mg | ORAL_TABLET | Freq: Every day | ORAL | Status: DC
  Administered 2013-09-25 – 2013-09-27 (×3): 30 mg via ORAL
  Filled 2013-09-25 (×3): qty 1

## 2013-09-25 MED ORDER — LABETALOL HCL 5 MG/ML IV SOLN
20.0000 mg | Freq: Once | INTRAVENOUS | Status: AC
  Administered 2013-09-25: 20 mg via INTRAVENOUS
  Filled 2013-09-25: qty 4

## 2013-09-25 MED ORDER — NITROGLYCERIN 0.4 MG SL SUBL
0.4000 mg | SUBLINGUAL_TABLET | SUBLINGUAL | Status: DC | PRN
  Administered 2013-09-25 (×3): 0.4 mg via SUBLINGUAL
  Filled 2013-09-25: qty 25

## 2013-09-25 MED ORDER — ACETAMINOPHEN 325 MG PO TABS: 650.0000 mg | ORAL_TABLET | Freq: Four times a day (QID) | ORAL | Status: DC | PRN

## 2013-09-25 MED ORDER — ONDANSETRON HCL 4 MG PO TABS: 4.0000 mg | ORAL_TABLET | Freq: Four times a day (QID) | ORAL | Status: DC | PRN

## 2013-09-25 MED ORDER — SODIUM CHLORIDE 0.9 % IV SOLN
INTRAVENOUS | Status: DC
  Administered 2013-09-25: 19:00:00 via INTRAVENOUS

## 2013-09-25 MED ORDER — LABETALOL HCL 5 MG/ML IV SOLN: 80.0000 mg | Freq: Once | INTRAVENOUS | Status: DC

## 2013-09-25 MED ORDER — ONDANSETRON HCL 4 MG/2ML IJ SOLN: 4.0000 mg | Freq: Four times a day (QID) | INTRAMUSCULAR | Status: DC | PRN

## 2013-09-25 MED ORDER — HYDRALAZINE HCL 100 MG PO TABS: 100.0000 mg | ORAL_TABLET | Freq: Three times a day (TID) | ORAL | Status: DC

## 2013-09-25 MED ORDER — LABETALOL HCL 5 MG/ML IV SOLN
40.0000 mg | Freq: Once | INTRAVENOUS | Status: AC
  Administered 2013-09-25: 40 mg via INTRAVENOUS
  Filled 2013-09-25: qty 8

## 2013-09-25 MED ORDER — HEPARIN SODIUM (PORCINE) 5000 UNIT/ML IJ SOLN
5000.0000 [IU] | Freq: Three times a day (TID) | INTRAMUSCULAR | Status: DC
  Administered 2013-09-25 – 2013-09-27 (×6): 5000 [IU] via SUBCUTANEOUS
  Filled 2013-09-25 (×8): qty 1

## 2013-09-25 MED ORDER — ONDANSETRON HCL 4 MG/2ML IJ SOLN
4.0000 mg | Freq: Four times a day (QID) | INTRAMUSCULAR | Status: DC
  Administered 2013-09-25 – 2013-09-26 (×3): 4 mg via INTRAVENOUS
  Filled 2013-09-25 (×4): qty 2

## 2013-09-25 MED ORDER — ASPIRIN 81 MG PO CHEW
324.0000 mg | CHEWABLE_TABLET | Freq: Once | ORAL | Status: AC
  Administered 2013-09-25: 324 mg via ORAL
  Filled 2013-09-25: qty 4

## 2013-09-25 MED ORDER — ACETAMINOPHEN 650 MG RE SUPP: 650.0000 mg | Freq: Four times a day (QID) | RECTAL | Status: DC | PRN

## 2013-09-25 MED ORDER — CALCITRIOL 0.25 MCG PO CAPS
0.2500 ug | ORAL_CAPSULE | ORAL | Status: DC
  Administered 2013-09-26: 0.25 ug via ORAL
  Filled 2013-09-25: qty 1

## 2013-09-25 MED ORDER — HYDRALAZINE HCL 50 MG PO TABS
100.0000 mg | ORAL_TABLET | Freq: Three times a day (TID) | ORAL | Status: DC
  Administered 2013-09-25 – 2013-09-27 (×6): 100 mg via ORAL
  Filled 2013-09-25 (×8): qty 2

## 2013-09-25 MED ORDER — ASPIRIN EC 325 MG PO TBEC
325.0000 mg | DELAYED_RELEASE_TABLET | Freq: Every day | ORAL | Status: DC
  Administered 2013-09-26 – 2013-09-27 (×2): 325 mg via ORAL
  Filled 2013-09-25 (×2): qty 1

## 2013-09-25 NOTE — ED Notes (Signed)
The patient is unable to give an urine specimen at this time. The patient has been advised to call for assistance to the restroom. The tech has reported to the RN in charge.

## 2013-09-25 NOTE — ED Notes (Signed)
2nd RN looked for IV, didn't see anything to stick. Will notify MD.

## 2013-09-25 NOTE — ED Notes (Signed)
Called RT to start arterial line.

## 2013-09-25 NOTE — ED Notes (Signed)
Pt requesting ice cream, told we need to wait until the admitting physician sees the pt to determine plan of care before allowing her to eat anything.

## 2013-09-25 NOTE — ED Notes (Signed)
Pt continues to request more wholesome food, continue to inform pt that the physicians ordered clear liquid diet at this time and we can't not feed her anything at this moment.

## 2013-09-25 NOTE — H&P (Signed)
Date: 09/26/2013               Patient Name:  [name] MRN: [id number]  DOB: [birth year] Age / Sex: 52 y.o., female   PCP: No Pcp Per Patient         Medical Service: Internal Medicine Teaching Service         Attending Physician: Dr. Madilyn Fireman, MD    First Contact: Dr. Lesly Dukes Pager: [phone]  Second Contact: Dr. Randell Loop Pager: [phone]       After Hours (After 5p/  First Contact Pager: [phone]  weekends / holidays): Second Contact Pager: [phone]   Chief Complaint: Shortness of breath  History of Present Illness:  [name] is a 52 y.o. with PMH multiple admissions for severe HTN, s/p spontaneous intracerebral hemorrhage, s/p [facility] 03/2002, required V-P Shunt, CAD, s/p NSTEMI 11/2007, s/p DES to RCA, CKD-5 who presents with shortness of breath, found to have a BP of 260/130. History was obtained from patient and daughter, who was at the bedside. Patient has some baseline cognitive impairment.  The patient reports that she has felt "bad" since Monday. Her symptoms started as nausea, vomiting, and diarrhea. Vomitus is NBNB, unclear frequency but none today. As far as the diarrhea she has had up to 10 episodes of NB loose stool per day per the daughter (patient herself initially denied having diarrhea). She reports being very hungry, but per daughter she has had anorexia and decreased PO intake all week. She admits to not taking her medications as prescribed this week. Since Tuesday, she also reports shortness of breath and a "red color" in her right eye. Denies headache, chest pain, weakness, numbness, tingling. She does report feeling a little confused. Denies dysuria. She endorses mild abdominal pain in her epigastric region.   She was admitted to the hospitalist service twice in 7/14 for malignant hypertension in the setting of medication noncompliance. During her first admission, she developed AKI on top of CKD. [facility] saw her in consultation and performed a right basilic  vein transposition AV fistula on 05/06/13. She was supposed to follow up with [facility] in August but did not. In fact she has not followed with renal or neurosurgery (Dr. Christella Noa) as an outpatient in the past year. No history of cocaine use.  In the ED she was was given IV labetalol 60mg , and after this her BP decreased to 148/80. Her home meds were restarted. BP was 188/95 on our examination.   Meds: Current Facility-Administered Medications  Medication Dose Route Frequency Provider Last Rate Last Dose  . 0.9 %  sodium chloride infusion   Intravenous Continuous Blain Pais, MD 50 mL/hr at 09/26/13 0400    . acetaminophen (TYLENOL) tablet 650 mg  650 mg Oral Q6H PRN Blain Pais, MD       Or  . acetaminophen (TYLENOL) suppository 650 mg  650 mg Rectal Q6H PRN Blain Pais, MD      . amLODipine (NORVASC) tablet 10 mg  10 mg Oral Daily Varney Biles, MD   10 mg at 09/25/13 1217  . aspirin EC tablet 325 mg  325 mg Oral Daily Blain Pais, MD      . calcitRIOL (ROCALTROL) capsule 0.25 mcg  0.25 mcg Oral Q M,W,F Blain Pais, MD      . heparin injection 5,000 Units  5,000 Units Subcutaneous Q8H Blain Pais, MD   5,000 Units at 09/26/13 [id number]  . hydrALAZINE (APRESOLINE) tablet  100 mg  100 mg Oral TID Varney Biles, MD   100 mg at 09/25/13 2325  . isosorbide mononitrate (IMDUR) 24 hr tablet 30 mg  30 mg Oral Daily Ankit Nanavati, MD   30 mg at 09/25/13 1217  . nitroGLYCERIN (NITROSTAT) SL tablet 0.4 mg  0.4 mg Sublingual Q5 min PRN Varney Biles, MD   0.4 mg at 09/25/13 1335  . ondansetron (ZOFRAN) injection 4 mg  4 mg Intravenous Q6H Ankit Nanavati, MD   4 mg at 09/25/13 2325  . ondansetron (ZOFRAN) tablet 4 mg  4 mg Oral Q6H PRN Blain Pais, MD       Or  . ondansetron (ZOFRAN) injection 4 mg  4 mg Intravenous Q6H PRN Blain Pais, MD        Allergies: Allergies as of 09/25/2013 - Review Complete 09/25/2013  Allergen Reaction  Noted  . Ampicillin Hives and Rash   . Penicillins Hives and Rash    Past Medical History  Diagnosis Date  . HTN (hypertension)     resistant  . Hypercholesterolemia   . Tobacco abuse     resumed smoking half a pack a day. She was a smoker in the past and states she was abl eto stop in the past using nicotine patches  . CAD (coronary artery disease)     Pt has St elevation MI in Feb 2009. Left hear tcath at tha ttime showed a 70% distal LAD lesion that was hazy consistent with a plaque rupture. She had a 50% distal circumflex stenosis an a 95% distal RCA stenosis. She did havve drug eluting stent placed in her distal RCA  . Chronic kidney disease     most recent creatinine 1.4 in 3/10  . Intracranial hemorrhage, spontaneous intraparenchymal, idiopathic, remote, resolved   . Diastolic heart failure     pt most recent echo was in Feb 2009 w an EF of 60% and severe left ventricular hypertrophy. The pt did have evidence of diastolic dysfunction. The RV appeared normal  . Subarachnoid hemorrhage     03/2012  . Aneurysm   . CHF (congestive heart failure)     diastolic   Past Surgical History  Procedure Laterality Date  . Ventriculoperitoneal shunt  03/27/2012    Procedure: SHUNT INSERTION VENTRICULAR-PERITONEAL;  Surgeon: Winfield Cunas, MD;  Location: [facility];  Service: Neurosurgery;  Laterality: N/A;  Ventricular-Peritoneal Shunt Insertion  . Cardiac catheterization    . Coronary angioplasty    . Abdominal hysterectomy    . Bascilic vein transposition Right 05/13/2013    Procedure: [place];  Surgeon: Rosetta Posner, MD;  Location: [facility] OR;  Service: Vascular;  Laterality: Right;   Family History  Problem Relation Age of Onset  . Coronary artery disease Other     premature in 1st degree relatives   History   Social History  . Marital Status: Single    Spouse Name: N/A    Number of Children: N/A  . Years of Education: N/A   Occupational History  . Not on  file.   Social History Main Topics  . Smoking status: Current Every Day Smoker -- 0.20 packs/day for 35 years    Types: Cigarettes  . Smokeless tobacco: Never Used     Comment: 1/2 ppd   . Alcohol Use: No  . Drug Use: No  . Sexual Activity: Not on file   Other Topics Concern  . Not on file   Social History Narrative  Lives by herself but helps care for her 8 grandchildren.     Review of Systems: Pertinent items are noted in HPI.  Physical Exam: Blood pressure 170/90, pulse 66, temperature 97.6 F (36.4 C), temperature source Oral, resp. rate 17, height 5\' 1"  (1.549 m), weight 140 lb 10.5 oz (63.8 kg), SpO2 100.00%. Physical Exam  Constitutional: She is well-developed, well-nourished, and in no distress.  HENT:  Head: Normocephalic and atraumatic.  Dry mucous membranes  Eyes: Right eye visual fields normal and left eye visual fields normal. Conjunctivae and EOM are normal. Pupils are equal, round, and reactive to light. Right eye exhibits no discharge and no exudate. Left eye exhibits no discharge and no exudate. Right conjunctiva is not injected. Right conjunctiva has no hemorrhage. Left conjunctiva is not injected. Left conjunctiva has no hemorrhage.  Fundoscopic exam without gross abnormality but unable to obtain clear view of disc.  Neck: Normal range of motion. Neck supple.  Cardiovascular: Normal rate, regular rhythm, normal heart sounds and intact distal pulses.  Exam reveals no gallop and no friction rub.   No murmur heard. Pulmonary/Chest: Effort normal and breath sounds normal. No respiratory distress. She has no wheezes. She has no rales. She exhibits no tenderness.  Abdominal: Soft. Bowel sounds are normal. She exhibits no distension and no mass. There is tenderness (Mildly tender in epigastric region). There is no rebound and no guarding.  Musculoskeletal: Normal range of motion. She exhibits no edema and no tenderness.  Neurological: She is alert. She has normal  sensation and normal strength. No cranial nerve deficit.  GCS 14, losing points for mild confusion. Knows place and name but thinks date is "2016 or 2015".  Skin: Skin is warm and dry. She is not diaphoretic.  Psychiatric:  Distant affect    Lab results: Basic Metabolic Panel:  Recent Labs  09/25/13 1113 09/26/13 0058  NA 136 136  K 3.5 3.5  CL 102 106  CO2 15* 17*  GLUCOSE 91 89  BUN 47* 46*  CREATININE 4.10* 3.91*  CALCIUM 9.1 8.3*  MG 2.3  --   PHOS 4.7*  --    Liver Function Tests:  Recent Labs  09/25/13 1113 09/26/13 0058  AST 116* 71*  ALT 282* 213*  ALKPHOS 167* 132*  BILITOT 1.1 0.7  PROT 6.5 5.3*  ALBUMIN 3.3* 2.9*    Recent Labs  09/25/13 1113  LIPASE 51    Recent Labs  09/25/13 1656  AMMONIA 26   CBC:  Recent Labs  09/25/13 1113 09/26/13 0058  WBC 5.9 6.2  NEUTROABS 3.7 3.3  HGB 11.6* 10.0*  HCT 34.3* 29.6*  MCV 88.4 86.5  PLT 198 174   Cardiac Enzymes:  Recent Labs  09/25/13 1113 09/25/13 1850 09/26/13 0058  TROPONINI 0.32* 0.35* 0.47*   Coagulation:  Recent Labs  09/25/13 1850  LABPROT 17.5*  INR 1.48   Urine Drug Screen: Drugs of Abuse     Component Value Date/Time   LABOPIA NEGATIVE 09/25/2012 2042   LABOPIA POSITIVE* 03/05/2012 1112   COCAINSCRNUR NEGATIVE 09/25/2012 2042   COCAINSCRNUR NONE DETECTED 03/05/2012 1112   LABBENZ NEGATIVE 09/25/2012 2042   LABBENZ NONE DETECTED 03/05/2012 1112   AMPHETMU NEGATIVE 09/25/2012 2042   AMPHETMU NONE DETECTED 03/05/2012 1112   THCU NONE DETECTED 03/05/2012 1112   LABBARB NONE DETECTED 03/05/2012 1112    Urinalysis:  Recent Labs  09/25/13 1213  COLORURINE YELLOW  LABSPEC 1.016  PHURINE 5.0  GLUCOSEU NEGATIVE  HGBUR LARGE*  BILIRUBINUR NEGATIVE  KETONESUR NEGATIVE  PROTEINUR >300*  UROBILINOGEN 0.2  NITRITE NEGATIVE  LEUKOCYTESUR TRACE*     Imaging results:  Ct Head Wo Contrast  09/25/2013   CLINICAL DATA:  Nausea and vomiting. Right eye pain and visual  disturbance. Ventriculoperitoneal shunt.  EXAM: CT HEAD WITHOUT CONTRAST  TECHNIQUE: Contiguous axial images were obtained from the base of the skull through the vertex without intravenous contrast.  COMPARISON:  03/24/2013.  FINDINGS: The right frontal ventriculoperitoneal shunt is unchanged. Normal sized ventricles with an interval decrease in size. Stable streak artifacts from aneurysm clips and coils at the skull base. Stable post craniotomy changes on the right. No intracranial hemorrhage, mass lesion or CT evidence of acute infarction. 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 Stable mild patchy white matter low density in both cerebral hemispheres.  IMPRESSION: 1. Stable right ventriculoperitoneal shunt tube with an interval decrease in size of the ventricles. 2. 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 3. Stable mild chronic small vessel white matter ischemic changes in both cerebral hemispheres. 4. No acute abnormality.   Electronically Signed   By: Enrique Sack M.D.   On: 09/25/2013 17:06   US Abdomen Complete  09/25/2013   CLINICAL DATA:  Abdominal pain, nausea/vomiting  EXAM: ULTRASOUND ABDOMEN COMPLETE  COMPARISON:  None.  FINDINGS: Gallbladder:  Underdistended. Mild gallbladder wall thickening, measuring 7-8 mm. No gallstones or pericholecystic fluid. Negative sonographic Murphy's sign.  Common bile duct:  Diameter: 3 mm.  Liver:  No focal lesion identified. Within normal limits in parenchymal echogenicity.  IVC:  No abnormality visualized.  Pancreas:  Visualized portions are unremarkable.  Spleen:  Measures 3.8 cm  Right Kidney:  Length: 10.2 cm. Increased parenchymal echogenicity, suggesting medical renal disease. No hydronephrosis.  Left Kidney:  Length: 9.7 cm. Increased parenchymal echogenicity, suggesting medical renal disease. Two lower pole cysts measuring up to 1.2 x 1.6 x 1.4 cm.  No hydronephrosis.  Abdominal aorta:  No aneurysm visualized.  Other findings:  Perihepatic ascites.  Bilateral pleural effusions.  IMPRESSION: Mild gallbladder wall thickening, nonspecific. No associated sonographic findings to suggest acute cholecystitis.  Perihepatic ascites.  Bilateral pleural effusions.   Electronically Signed   By: Julian Hy M.D.   On: 09/25/2013 14:40   Dg Chest Port 1 View  09/25/2013   CLINICAL DATA:  Chest pain, shortness of Breath  EXAM: PORTABLE CHEST - 1 VIEW  COMPARISON:  05/07/2013  FINDINGS: Cardiomegaly again noted. Right VP shunt catheter. No acute infiltrate or pleural effusion. No pulmonary edema. Mild left basilar atelectasis.  IMPRESSION: No acute infiltrate or pulmonary edema. Mild left basilar atelectasis.   Electronically Signed   By: Lahoma Crocker M.D.   On: 09/25/2013 11:22    Other results: EKG: normal sinus rhythm. LVH. No ST-T wave changes. Noted resolution of TWI in V5-V6 seen in prior tracing 05/17/13.  Assessment & Plan by Problem: Alexanne Zoll is a 52 y.o. with PMH multiple admissions for severe HTN, s/p spontaneous intracerebral hemorrhage, s/p [facility] 03/2002, required V-P Shunt, CAD, s/p NSTEMI 11/2007, s/p DES to RCA, CKD-5 who presents with shortness of breath, found to have a BP of 260/130.   #Accelerated malignant hypertension - Patient admitted with BP 260/130 in the setting of medication noncompliance and decreased PO intake with nausea, vomiting, and diarrhea. Patient has a history of noncompliance with medications and follow up appointments leading  to multiple admissions for hypertension emergency. Neurologically, she has no deficits today other than some possible mild confusion (though reports of cognitive impairment at baseline) and a subjective observation of redness in her right eye. Physical examination was unremarkable. Treatment of hypertension emergency is typically reduction of the BP by no more than 25 percent compared with baseline by  the end of the first day of treatment; however her MAP was already reduced from 160 to 115 in the ED. We will observe her closely for symptoms of ischemic or hemorrhagic stroke overnight, shooting for a blood pressure of [id number] systolic overnight. Tomorrow after 24 hours we can liberalize her BP goal to 140/90. - Admit to IMTS, stepdown - Stat head CT to rule out hemorrhage, VP shunt malfunction > No acute abnormality - Neuro checks q4h - Daily weights - Is and Os - Repeat EKG in am - Home Amlodipine 10mg  daily - Home Hydralazine 100mg  TID - Home Imdur 30mg  daily - Home ASA 325mg  daily - Holding home clonidine 0.2mg /24hr patch, Lasix 80 mg twice a day, labetalol 200 mg 3 times a day - SL nitroglycerin prn - IVF NS @50cc /hr - Tylenol prn pain - Zofran prn nausea - PT/OT eval and treat - CBC, CMP, PT/INR in am  #New LFT abnormalities - CMP notable for elevated transaminases per below. Abdominal US largely unremarkable: notable for mild gallbladder wall thickening, nonspecific. No associated sonographic findings to suggest acute cholecystitis. Perihepatic ascites. Bilateral pleural effusions. - Ammonia level to evaluate for hepatic encephalopathy given confusion - Hepatitis panel  - Repeat CMP in am (note of hemolysis causing false elevation on lab result)  #CKD stage 5 - Cr 4.1 on admission, unknown baseline but was 5.0-5.3 last admission. GFR 13. She is s/p right basilic vein transposition AV fistula on 05/06/13, but has not had dialysis or followed up with a nephrologist since then. Compliance is an issue for this patient. - Continue Calcitriol 0.25mg  MWF - Will need renal follow up, inpatient vs. outpatient  #Mildly elevated troponin with history of coronary artery disease - Troponin 0.32 on admission>0.35>0.47. Currently chest pain-free. Prior EKG with stable T-wave inversions in the lateral leads, but these actually are not present today. She does have history of drug eluting stent to  the RCA. Her cardiologist is Dr. Aundra Dubin. - Cycle troponin, 1 more pending  #Right eye subjective color change - Patient with subjective observation of redness in her right eye. Dr. Baird Cancer with opthalmology was curbsided and recommended BP management and outpatient follow up in his office (he is a retinal surgeon). He felt she likely did have a retinal hemorrhage, but nothing needed to be done emergently other than controlling the BP. - Will arrange outpatient follow up with Dr. Baird Cancer  #Diarrhea - Patient has reportedly (per daughter) had profuse NB diarrhea since Monday, up to 10 times per day. No recent antibiotic use but multiple hospital admissions in the past 6 months. - C. Difficile PCR  #Trichomonas infection - Seen on UA. - Flagyl 2g x1 (safe in renal failure) - Will counsel on safe sexual practices in the am  #CHF, grade 2 diastolic dysfunction - 2D echo in 05/07/13 showed EF 60% to [id number], grade 2 diastolic dysfunction, small pericardial effusion with no hemodynamic sequelae. She is not on an ACE inhibitor secondary to her progressive CKD. CXR with No acute infiltrate or pulmonary edema. Physical exam not suggestive of volume overload. No LE edema, JVD, symptoms of orthopnea or PND. - Continue home medications  as above  #History of noncompliance with medical treatment, presenting hazards to health - Has been a chronic issue for her. - Social work consult in am  #Nutrition - Clear liquid diet  #DVT PPX - SCDs  Dispo: Disposition is deferred at this time, awaiting improvement of current medical problems. Anticipated discharge in approximately 1-3 day(s).   The patient does not know have a current PCP (No Pcp Per Patient) and does need an [facility] hospital follow-up appointment after discharge.  The patient does not have transportation limitations that hinder transportation to clinic appointments.  Signed: Lesly Dukes, MD 09/26/2013, 6:57 AM

## 2013-09-25 NOTE — ED Notes (Signed)
IV team at bedside 

## 2013-09-25 NOTE — ED Notes (Signed)
Gave pt broth to drink. Admitting physician told pt clear liquid diet at this point.

## 2013-09-25 NOTE — ED Notes (Signed)
Per EMS - pt started not feeling well since Monday, c/o n/v, last vomited this morning. Last meal was yesterday. Has fistula in right arm, supposed to start dialysis last may but hasn't gone d/t noncompliance. Has been living with her daughter for a month now, and having issues finding transportation to dialysis. BP 240/140, HR 70 NSR. RR 20. Pt c/o sob, lungs sound clear. C/o right eye pain that started a few days ago. No neuro deficits.

## 2013-09-25 NOTE — ED Provider Notes (Addendum)
CSN: [id number]     Arrival date & time 09/25/13  1034 History   First MD Initiated Contact with Patient 09/25/13 1047     Chief Complaint  Patient presents with  . Nausea  . Emesis  . Abdominal Pain  . Shortness of Breath   (Consider location/radiation/quality/duration/timing/severity/associated sxs/prior Treatment) HPI Comments: 52 y.o. female with known history of severe HTN, s/p Spontaneous intracerebral hemorrhage, s/p [facility] 03/2002, required V-P Shunt, CAD, s/p NSTEMI 11/2007, s/p DES to RCA, CKD-4 (baseline creatinine 2.21-2.56 in 09/2012) comes in with cc of dib. Pt is noted to have a BP of 260/130 during my evaluation, with MAP > 160. Pt denies headaches, blurry vision, + nausea, + dib, no chest pain. Pt admits to not taking her meds as prescribed.  Patient is a 52 y.o. female presenting with vomiting, abdominal pain, and shortness of breath. The history is provided by the patient.  Emesis Associated symptoms: no abdominal pain, no diarrhea and no headaches   Abdominal Pain Associated symptoms: nausea, shortness of breath and vomiting   Associated symptoms: no chest pain, no constipation, no cough, no diarrhea and no hematuria   Shortness of Breath Associated symptoms: vomiting   Associated symptoms: no abdominal pain, no chest pain, no cough, no headaches, no neck pain and no wheezing     Past Medical History  Diagnosis Date  . HTN (hypertension)     resistant  . Hypercholesterolemia   . Tobacco abuse     resumed smoking half a pack a day. She was a smoker in the past and states she was abl eto stop in the past using nicotine patches  . CAD (coronary artery disease)     Pt has St elevation MI in Feb 2009. Left hear tcath at tha ttime showed a 70% distal LAD lesion that was hazy consistent with a plaque rupture. She had a 50% distal circumflex stenosis an a 95% distal RCA stenosis. She did havve drug eluting stent placed in her distal RCA  . Chronic kidney disease     most  recent creatinine 1.4 in 3/10  . Intracranial hemorrhage, spontaneous intraparenchymal, idiopathic, remote, resolved   . Diastolic heart failure     pt most recent echo was in Feb 2009 w an EF of 60% and severe left ventricular hypertrophy. The pt did have evidence of diastolic dysfunction. The RV appeared normal  . Subarachnoid hemorrhage     03/2012  . Aneurysm   . CHF (congestive heart failure)     diastolic   Past Surgical History  Procedure Laterality Date  . Ventriculoperitoneal shunt  03/27/2012    Procedure: SHUNT INSERTION VENTRICULAR-PERITONEAL;  Surgeon: Winfield Cunas, MD;  Location: [place] [facility];  Service: Neurosurgery;  Laterality: N/A;  Ventricular-Peritoneal Shunt Insertion  . Cardiac catheterization    . Coronary angioplasty    . Abdominal hysterectomy    . Bascilic vein transposition Right 05/13/2013    Procedure: [place];  Surgeon: Rosetta Posner, MD;  Location: [facility] OR;  Service: Vascular;  Laterality: Right;   Family History  Problem Relation Age of Onset  . Coronary artery disease Other     premature in 1st degree relatives   History  Substance Use Topics  . Smoking status: Current Every Day Smoker -- 0.20 packs/day for 35 years    Types: Cigarettes  . Smokeless tobacco: Never Used     Comment: 1/2 ppd   . Alcohol Use: No   OB History  Grav Para Term Preterm Abortions TAB SAB Ect Mult Living                 Review of Systems  Constitutional: Negative for activity change.  HENT: Negative for facial swelling.   Eyes: Positive for pain and redness. Negative for visual disturbance.  Respiratory: Positive for shortness of breath. Negative for cough and wheezing.   Cardiovascular: Negative for chest pain.  Gastrointestinal: Positive for nausea and vomiting. Negative for abdominal pain, diarrhea, constipation, blood in stool and abdominal distention.  Genitourinary: Negative for hematuria and difficulty urinating.  Musculoskeletal: Negative  for neck pain.  Skin: Negative for color change.  Neurological: Negative for speech difficulty and headaches.  Hematological: Does not bruise/bleed easily.  Psychiatric/Behavioral: Negative for confusion.    Allergies  Ampicillin and Penicillins  Home Medications   Current Outpatient Rx  Name  Route  Sig  Dispense  Refill  . acetaminophen (TYLENOL) 325 MG tablet   Oral   Take 650 mg by mouth every 6 (six) hours as needed for pain (headache).         Marland Kitchen amLODipine (NORVASC) 10 MG tablet   Oral   Take 1 tablet (10 mg total) by mouth daily.   30 tablet   0   . calcitRIOL (ROCALTROL) 0.25 MCG capsule   Oral   Take 1 capsule (0.25 mcg total) by mouth every Monday, Wednesday, and Friday.   30 capsule   0   . camphor-menthol (SARNA) lotion   Topical   Apply topically as needed for itching.   222 mL   0   . cloNIDine (CATAPRES - DOSED IN MG/24 HR) 0.2 mg/24hr patch   Transdermal   Place 1 patch (0.2 mg total) onto the skin once a week.   10 patch   0   . furosemide (LASIX) 80 MG tablet   Oral   Take 1 tablet (80 mg total) by mouth 2 (two) times daily.   60 tablet   0   . hydrALAZINE (APRESOLINE) 100 MG tablet   Oral   Take 1 tablet (100 mg total) by mouth 3 (three) times daily.   90 tablet   0   . isosorbide mononitrate (IMDUR) 30 MG 24 hr tablet   Oral   Take 30 mg by mouth daily.         Marland Kitchen labetalol (NORMODYNE) 200 MG tablet   Oral   Take 1 tablet (200 mg total) by mouth 3 (three) times daily.   90 tablet   0   . nitroGLYCERIN (NITROSTAT) 0.4 MG SL tablet   Sublingual   Place 1 tablet (0.4 mg total) under the tongue every 5 (five) minutes as needed for chest pain.   20 tablet   0    BP 209/105  Pulse 66  Temp(Src) 98.5 F (36.9 C) (Oral)  Resp 25  SpO2 99% Physical Exam  Nursing note and vitals reviewed. Constitutional: She is oriented to person, place, and time. She appears well-developed and well-nourished.  HENT:  Head: Normocephalic  and atraumatic.  Eyes: EOM are normal. Pupils are equal, round, and reactive to light.  Neck: Neck supple. JVD present.  Cardiovascular: Normal rate and regular rhythm.   Murmur heard. Pulmonary/Chest: Effort normal. No respiratory distress.  Abdominal: Soft. She exhibits no distension. There is no tenderness. There is no rebound and no guarding.  Neurological: She is alert and oriented to person, place, and time.  Skin: Skin is warm and dry.  ED Course  Procedures (including critical care time) Labs Review Labs Reviewed  CBC WITH DIFFERENTIAL - Abnormal; Notable for the following:    Hemoglobin 11.6 (*)    HCT 34.3 (*)    RDW 17.1 (*)    All other components within normal limits  COMPREHENSIVE METABOLIC PANEL - Abnormal; Notable for the following:    CO2 15 (*)    BUN 47 (*)    Creatinine, Ser 4.10 (*)    Albumin 3.3 (*)    AST 116 (*)    ALT 282 (*)    Alkaline Phosphatase 167 (*)    GFR calc non Af Amer 12 (*)    GFR calc Af Amer 13 (*)    All other components within normal limits  URINALYSIS, ROUTINE W REFLEX MICROSCOPIC - Abnormal; Notable for the following:    APPearance CLOUDY (*)    Hgb urine dipstick LARGE (*)    Protein, ur >300 (*)    Leukocytes, UA TRACE (*)    All other components within normal limits  PHOSPHORUS - Abnormal; Notable for the following:    Phosphorus 4.7 (*)    All other components within normal limits  TROPONIN I - Abnormal; Notable for the following:    Troponin I 0.32 (*)    All other components within normal limits  URINE MICROSCOPIC-ADD ON - Abnormal; Notable for the following:    Squamous Epithelial / LPF MANY (*)    Bacteria, UA FEW (*)    All other components within normal limits  LIPASE, BLOOD  MAGNESIUM   Imaging Review Dg Chest Port 1 View  09/25/2013   CLINICAL DATA:  Chest pain, shortness of Breath  EXAM: PORTABLE CHEST - 1 VIEW  COMPARISON:  05/07/2013  FINDINGS: Cardiomegaly again noted. Right VP shunt catheter. No acute  infiltrate or pleural effusion. No pulmonary edema. Mild left basilar atelectasis.  IMPRESSION: No acute infiltrate or pulmonary edema. Mild left basilar atelectasis.   Electronically Signed   By: Lahoma Crocker M.D.   On: 09/25/2013 11:22    EKG Interpretation    Date/Time:  Sunday September 25 2013 10:56:49 EST Ventricular Rate:  82 PR Interval:  161 QRS Duration: 89 QT Interval:  427 QTC Calculation: 499 R Axis:   -12 Text Interpretation:  Sinus rhythm Left ventricular hypertrophy Nonspecific T abnormalities, lateral leads Borderline prolonged QT interval Confirmed by Kathrynn Humble, MD, Sakai Heinle ([id number]) on 09/25/2013 11:47:04 AM            MDM  No diagnosis found.  Pt comes in with cc of DIB.  Noted to have elevated BP with a MAP of 160, likely due to non compliance. No hx of cocaine use. Hx is suggestive of visits to the ED with malignant HTNs in the past.  Pt is being worked up for dialysis. Exam shows JVD, no wheezing - CXR shows no Pulm edema. Labs are not indicative of emergent need for dialysis. Troponin is elevated - but there is no STEMI.  Pt was given iv labetalol 20, 40, and after the 40 ivp her home meds were started. Her MAP is at goal of 120 now (actually at 115). Admission for malignant HTN. ASA given.   Varney Biles, MD 09/25/13 1318  Angiocath insertion Performed by: Varney Biles  Consent: Verbal consent obtained. Risks and benefits: risks, benefits and alternatives were discussed Time out: Immediately prior to procedure a "time out" was called to verify the correct patient, procedure, equipment, support staff and site/side marked as  required.  Preparation: Patient was prepped and draped in the usual sterile fashion.  Vein Location: Right EJ   Gauge: 20 gauge  Normal blood return and flush without difficulty Patient tolerance: Patient tolerated the procedure well with no immediate complications.   CRITICAL CARE Performed by: Varney Biles   Total critical care time: 45 minutes - HTN emergency, requiring IV meds.  Critical care time was exclusive of separately billable procedures and treating other patients.  Critical care was necessary to treat or prevent imminent or life-threatening deterioration.  Critical care was time spent personally by me on the following activities: development of treatment plan with patient and/or surrogate as well as nursing, discussions with consultants, evaluation of patient's response to treatment, examination of patient, obtaining history from patient or surrogate, ordering and performing treatments and interventions, ordering and review of laboratory studies, ordering and review of radiographic studies, pulse oximetry and re-evaluation of patient's condition.      Varney Biles, MD 09/25/13 1321

## 2013-09-25 NOTE — ED Notes (Signed)
IV team returned page, will come look for another site.

## 2013-09-25 NOTE — ED Notes (Signed)
Paged IV team 

## 2013-09-25 NOTE — ED Notes (Signed)
Pt c/o sudden onset of right sided CP. Notified Dr. Kathrynn Humble.

## 2013-09-25 NOTE — ED Notes (Signed)
Portable xray at bedside.

## 2013-09-25 NOTE — ED Notes (Signed)
Attempted to gain IV access, able to get a little blood for labs but vein blew.

## 2013-09-25 NOTE — ED Notes (Addendum)
Pt reports she last took her BP pills 2 day ago. sts on Monday she started feeling sob, cough and n/v/d. sts she is also having lower diffuse abd pain. Denies bld in stool and emesis. Pt sts she doesn't have a kidney doctor but wants one and wants to start dialysis. Denies HA/blurred vision. No neuro deficits. sts sometimes her right eye she has been seeing a red color. Nad, skin warm and dry, resp e/u.

## 2013-09-25 NOTE — ED Notes (Signed)
Family just arrived at bedside.

## 2013-09-26 LAB — COMPREHENSIVE METABOLIC PANEL
AST: 71 U/L — ABNORMAL HIGH (ref 0–37)
Albumin: 2.9 g/dL — ABNORMAL LOW (ref 3.5–5.2)
CO2: 17 mEq/L — ABNORMAL LOW (ref 19–32)
Calcium: 8.3 mg/dL — ABNORMAL LOW (ref 8.4–10.5)
Creatinine, Ser: 3.91 mg/dL — ABNORMAL HIGH (ref 0.50–1.10)
GFR calc Af Amer: 14 mL/min — ABNORMAL LOW (ref >90)
GFR calc non Af Amer: 12 mL/min — ABNORMAL LOW (ref >90)
Glucose, Bld: 89 mg/dL (ref 70–99)
Potassium: 3.5 mEq/L (ref 3.5–5.1)

## 2013-09-26 LAB — CBC WITH DIFFERENTIAL/PLATELET
Hemoglobin: 10 g/dL — ABNORMAL LOW (ref 12.0–15.0)
Lymphocytes Relative: 39 % (ref 12–46)
Lymphs Abs: 2.5 10*3/uL (ref 0.7–4.0)
MCH: 29.2 pg (ref 26.0–34.0)
Monocytes Absolute: 0.4 10*3/uL (ref 0.1–1.0)
Monocytes Relative: 6 % (ref 3–12)
Neutro Abs: 3.3 10*3/uL (ref 1.7–7.7)
Neutrophils Relative %: 53 % (ref 43–77)
RBC: 3.42 MIL/uL — ABNORMAL LOW (ref 3.87–5.11)
WBC: 6.2 10*3/uL (ref 4.0–10.5)

## 2013-09-26 LAB — HEPATITIS PANEL, ACUTE
HCV Ab: NEGATIVE
Hep A IgM: NONREACTIVE

## 2013-09-26 LAB — TROPONIN I
Troponin I: 0.47 ng/mL (ref <0.30)
Troponin I: <0.3 ng/mL (ref <0.30)

## 2013-09-26 MED ORDER — LABETALOL HCL 200 MG PO TABS
200.0000 mg | ORAL_TABLET | Freq: Three times a day (TID) | ORAL | Status: DC
  Administered 2013-09-26 – 2013-09-27 (×4): 200 mg via ORAL
  Filled 2013-09-26 (×6): qty 1

## 2013-09-26 NOTE — Progress Notes (Signed)
Called by nurse that [name]'s IV access was lost. She was only getting maintenance fluids and zofran through the IV. Per the nurse she is taking great PO. Will d/c IV meds, write for PO zofran, and encourage that patient drink fluids.  Diane Dukes, MD  [name] Pager # [phone] Office # [phone]

## 2013-09-26 NOTE — Progress Notes (Signed)
Late note, Patient lost IV access in right neck EJ. IV team tried 3 times to get a PIV on patient with no success, called and spoke with Dr. Lucila Maine, she said it was ok for patient to have no access. Keep encouraging patient to drink fluids. Will continue to monitor patient.

## 2013-09-26 NOTE — H&P (Signed)
    Date: 09/26/2013      Patient name: [name]  Medical record number: [id number]  Date of birth: 1960-12-30  Assessment: 6F with emergent hypertension, admitted with a BP of 210/109 initially on presentation. Other issues: Chronic kidney disease creatinine baseline 4 - 5.5, transaminase elevation, troponin elevation.    Exam today: She feels much better. Denies headache, chest pain, abdominal pain, leg swelling, nausea, vomiting. She is still seeing a red spot as on admission. Her exam is significant for a normal heart and lung exam.   Plan  Hypertensive emergency - Blood pressure decreased by 25% of what she had at the time of admission. We will her home meds one by one and bring down the pressure slowly. The patient is aware of the risks of having uncontrolled hypertension, I had a discussion with her about this, and she nodded in agreement. Right now on amlodipine, hydralazine and labetolol. Recent BP 168/81.  Troponin elevation - Likely demand ischemia, did not have significant accompanying change on EKG and 4th set has set has trended down.     Recent Labs Lab 09/25/13 1113 09/25/13 1850 09/26/13 0058 09/26/13 0725  TROPONINI 0.32* 0.35* 0.47* <0.30    Vision disturbance - My team discussed this patient with ophthalmologist Dr Baird Cancer, who thinks decreasing the BP is the more emergent thing to do at this time. He thinks the patient should be followed up as outpatient. I have spend >10 minutes with the patient discussing the importance of this.  LFTs are trending down.  Kidney function is improving.   Case reviewed with IM resident team; rest of the plan per resident note,   Ellwood Dense, [facility] 09/26/2013, 3:21 PM.

## 2013-09-26 NOTE — Care Management Note (Signed)
    Page 1 of 1   09/26/2013     3:30:51 PM   CARE MANAGEMENT NOTE 09/26/2013  Patient:  [name], [name]   Account Number:  [id number]  Date Initiated:  09/26/2013  Documentation initiated by:  Marvetta Gibbons  Subjective/Objective Assessment:   Pt admitted with accelerated HTN     Action/Plan:   PTA pt lived at home- PT eval ordered and no recommendations made for discharge- NCM to cont. to follow.   Anticipated DC Date:  09/27/2013   Anticipated DC Plan:  [place]  CM consult      Choice offered to / List presented to:             Status of service:  In process, will continue to follow Medicare Important Message given?   (If response is "NO", the following Medicare IM given date fields will be blank) Date Medicare IM given:   Date Additional Medicare IM given:    Discharge Disposition:    Per UR Regulation:  Reviewed for med. necessity/level of care/duration of stay  If discussed at [place] of Stay Meetings, dates discussed:    Comments:  09/26/13- 1520- Marvetta Gibbons RN, BSN [phone] Spoke with pt at bedside regarding her medication noncompliance- per pt she states that she does not get her meds because she sometimes does not have the money- when asked how much her copays are- she states most of them are under $3- because pt has insurance she does not qualify for any assistance- counseled on the importance of getting her meds- she reports that she is currently staying with her youngest daughter.

## 2013-09-26 NOTE — Evaluation (Signed)
Physical Therapy Evaluation Patient Details Name: [name] MRN: [id number] DOB: July 16, 1961 Today's Date: 09/26/2013 Time: [id number] PT Time Calculation (min): 21 min  PT Assessment / Plan / Recommendation History of Present Illness  Pt admitted with accelerated HTN  Clinical Impression  Pt with lethargy. Pt with unsteadiness and dizziness with ambulation requiring 24/7 assist for safe OOB Mobility at this time. Suspect however that pt will progress well enough to be safe for d/c home with family. Acute PT to con't to work with pt to evaluate for needs of use of RW or HHPT.    PT Assessment  Patient needs continued PT services    Follow Up Recommendations  Supervision/Assistance - 24 hour;No PT follow up    Does the patient have the potential to tolerate intense rehabilitation      Barriers to Discharge   questionable support due to pt with questionable report of history    Equipment Recommendations  Rolling walker with 5" wheels    Recommendations for Other Services     Frequency Min 3X/week    Precautions / Restrictions Precautions Precautions: Fall Restrictions Weight Bearing Restrictions: No   Pertinent Vitals/Pain stable      Mobility  Bed Mobility Bed Mobility: Supine to Sit;Sit to Supine Supine to Sit: 6: Modified independent (Device/Increase time) Sit to Supine: 6: Modified independent (Device/Increase time) Details for Bed Mobility Assistance: slow but safe Transfers Transfers: Sit to Stand;Stand to Sit Sit to Stand: 4: Min guard Stand to Sit: 6: Modified independent (Device/Increase time) Details for Transfer Assistance: pt slow and guarded with transition into standing Ambulation/Gait Ambulation/Gait Assistance: 4: Min assist Ambulation Distance (Feet): 70 Feet Assistive device: 1 person hand held assist Ambulation/Gait Assistance Details: pt with antalgic gait with c/o "feels like my knee is going to give out on me" in reference to the R knee. Pt  reported onset of dizziness as well. RN reports she just gave pt all HTN meds due to BP being in the 190s.  Gait Pattern: Step-through pattern;Decreased stride length Gait velocity: slow General Gait Details: unsteady Stairs: No    Exercises     PT Diagnosis: Generalized weakness  PT Problem List: Decreased strength;Decreased activity tolerance;Decreased balance;Decreased mobility PT Treatment Interventions: DME instruction;Gait training;Stair training;Functional mobility training;Therapeutic activities;Therapeutic exercise     PT Goals(Current goals can be found in the care plan section) Acute Rehab PT Goals Patient Stated Goal: home PT Goal Formulation: With patient Time For Goal Achievement: 10/03/13 Potential to Achieve Goals: Good  Visit Information  Last PT Received On: 09/26/13 Assistance Needed: +1 History of Present Illness: Pt admitted with accelerated HTN       Prior Functioning  Home Living Family/patient expects to be discharged to:: Private residence Living Arrangements: Children Available Help at Discharge: Family;Available 24 hours/day Type of Home: Apartment Home Access: Stairs to enter Entrance Stairs-Number of Steps: 13 Entrance Stairs-Rails: Right Home Layout: One level Home Equipment: None Prior Function Level of Independence: Independent Communication Communication: No difficulties Dominant Hand: Right    Cognition  Cognition Arousal/Alertness: Lethargic Behavior During Therapy: Flat affect Overall Cognitive Status: Impaired/Different from baseline Area of Impairment: Problem solving Problem Solving: Slow processing;Requires verbal cues    Extremity/Trunk Assessment Upper Extremity Assessment Upper Extremity Assessment: Generalized weakness Lower Extremity Assessment Lower Extremity Assessment: Generalized weakness Cervical / Trunk Assessment Cervical / Trunk Assessment: Normal   Balance    End of Session PT - End of Session Equipment  Utilized During Treatment: Gait belt Activity Tolerance: Patient limited  by lethargy Patient left: in bed;with call bell/phone within reach Nurse Communication: Mobility status (onset of dizziness)  GP     Kingsley Callander 09/26/2013, 10:29 AM  Kittie Plater, PT, DPT Pager #: [phone] Office #: [phone]

## 2013-09-26 NOTE — Evaluation (Signed)
Occupational Therapy Evaluation Patient Details Name: [name] MRN: [id number] DOB: 1961/04/17 Today's Date: 09/26/2013 Time: [id number] OT Time Calculation (min): 22 min  OT Assessment / Plan / Recommendation History of present illness Pt admitted with malignant HTN, n/v, abdominal pain and SOB.  Pt with hx of medical non compliance.   Clinical Impression   Pt distracted by desire to eat solid food.  Requires min to min guard assist for OOB and standing ADL due to balance deficits.  Set up for seated ADL.  Will determine if pt needs tub equipment next visit.    OT Assessment  Patient needs continued OT Services    Follow Up Recommendations  No OT follow up    Barriers to Discharge      Equipment Recommendations   (to be determined)    Recommendations for Other Services    Frequency  Min 2X/week    Precautions / Restrictions Precautions Precautions: Fall Restrictions Weight Bearing Restrictions: No   Pertinent Vitals/Pain VSS, no pain    ADL  Eating/Feeding: Independent Where Assessed - Eating/Feeding: Edge of bed Grooming: Wash/dry hands;Wash/dry face;Teeth care;Min guard Where Assessed - Grooming: Unsupported standing Upper Body Bathing: Supervision/safety Where Assessed - Upper Body Bathing: Unsupported sitting Lower Body Bathing: Min guard Where Assessed - Lower Body Bathing: Unsupported sitting;Supported sit to stand Upper Body Dressing: Supervision/safety Where Assessed - Upper Body Dressing: Unsupported sitting Lower Body Dressing: Min guard Where Assessed - Lower Body Dressing: Unsupported sitting;Supported sit to stand Toilet Transfer: Min Psychiatric nurse Method: Sit to Loss adjuster, chartered: Comfort height toilet;Grab bars Toileting - Water quality scientist and Hygiene: Supervision/safety Where Assessed - Best boy and Hygiene: Sit to stand from 3-in-1 or toilet Equipment Used: Gait belt Transfers/Ambulation  Related to ADLs: hand held assist (min) to ambulate in room ADL Comments: Impaired balance interfering with ADL.  Pt likely with baseline cognitive issues given medical non compliance.    OT Diagnosis: Generalized weakness;Cognitive deficits;Disturbance of vision  OT Problem List: Impaired balance (sitting and/or standing);Decreased cognition;Decreased knowledge of use of DME or AE OT Treatment Interventions: Self-care/ADL training;DME and/or AE instruction;Balance training;Patient/family education   OT Goals(Current goals can be found in the care plan section) Acute Rehab OT Goals Patient Stated Goal: home OT Goal Formulation: With patient Time For Goal Achievement: 10/03/13 Potential to Achieve Goals: Good ADL Goals Pt Will Perform Grooming: with modified independence;standing Pt Will Perform Lower Body Bathing: with modified independence;sit to/from stand Pt Will Perform Lower Body Dressing: with modified independence;sit to/from stand Pt Will Transfer to Toilet: with modified independence;ambulating;regular height toilet Pt Will Perform Toileting - Clothing Manipulation and hygiene: with modified independence;sit to/from stand Pt Will Perform Tub/Shower Transfer: with supervision;ambulating (determine need for DME)  Visit Information  Last OT Received On: 09/26/13 Assistance Needed: +1 History of Present Illness: Pt admitted with malignant HTN, n/v, abdominal pain and SOB.  Pt with hx of medical non compliance.       Prior [place] expects to be discharged to:: Private residence Living Arrangements: Children Available Help at Discharge: Family;Available 24 hours/day Type of Home: Apartment Home Access: Stairs to enter Entrance Stairs-Number of Steps: 13 Entrance Stairs-Rails: Right Home Layout: One level Home Equipment: None Prior Function Level of Independence: Independent Communication Communication: No difficulties Dominant Hand:  Right         Vision/Perception Vision - History Baseline Vision: Wears glasses only for reading Patient Visual Report: Blurring of vision (R eye)  Cognition  Cognition Arousal/Alertness: Awake/alert Behavior During Therapy: Flat affect Overall Cognitive Status: Impaired/Different from baseline Area of Impairment: Problem solving Problem Solving: Slow processing;Requires verbal cues General Comments: pt preoccupied with wanting to eat    Extremity/Trunk Assessment Upper Extremity Assessment Upper Extremity Assessment: Overall WFL for tasks assessed Lower Extremity Assessment Lower Extremity Assessment: Defer to PT evaluation Cervical / Trunk Assessment Cervical / Trunk Assessment: Normal     Mobility Bed Mobility Bed Mobility: Supine to Sit;Sit to Supine Supine to Sit: 6: Modified independent (Device/Increase time) Sit to Supine: 6: Modified independent (Device/Increase time) Details for Bed Mobility Assistance: slow but safe Transfers Sit to Stand: 4: Min guard Stand to Sit: 6: Modified independent (Device/Increase time) Details for Transfer Assistance: pt slow and guarded with transition into standing     Exercise     Balance     End of Session OT - End of Session Activity Tolerance: Patient tolerated treatment well Patient left: in bed;with call bell/phone within reach  GO     Malka So 09/26/2013, 1:17 PM [phone]

## 2013-09-26 NOTE — Progress Notes (Signed)
Utilization review completed.  

## 2013-09-26 NOTE — Progress Notes (Signed)
Subjective: Patient seen and evaluated at the bedside. She states she is hungry. Denies nausea, vomiting, diarrhea. Denies weakness, numbness, new visual changes. She is still seeing red out of her right eye. No chest pain or shortness of breath.  Objective: Vital signs in last 24 hours: Filed Vitals:   09/25/13 2330 09/26/13 0320 09/26/13 0800 09/26/13 0809  BP: 171/89 170/90 192/90   Pulse: 68 66    Temp: 97.5 F (36.4 C) 97.6 F (36.4 C) 97.6 F (36.4 C) 97.6 F (36.4 C)  TempSrc: Oral Oral Oral Oral  Resp: 26 17    Height:      Weight:  140 lb 10.5 oz (63.8 kg)    SpO2: 100% 100%     Weight change:   Intake/Output Summary (Last 24 hours) at 09/26/13 0900 Last data filed at 09/26/13 0800  Gross per 24 hour  Intake 746.67 ml  Output    900 ml  Net -153.33 ml   Physical Exam  Constitutional: She is well-developed, well-nourished, and in no distress.  HENT:  Head: Normocephalic and atraumatic.  Eyes: Right eye visual fields normal and left eye visual fields normal. Conjunctivae and EOM are normal. Pupils are equal, round, and reactive to light. Right eye exhibits no discharge and no exudate. Left eye exhibits no discharge and no exudate. Right conjunctiva is not injected. Right conjunctiva has no hemorrhage. Left conjunctiva is not injected. Left conjunctiva has no hemorrhage.  Neck: Normal range of motion. Neck supple.  Cardiovascular: Normal rate, regular rhythm, normal heart sounds and intact distal pulses. Exam reveals no gallop and no friction rub.  No murmur heard.  Pulmonary/Chest: Effort normal and breath sounds normal. No respiratory distress. She has no wheezes. She has no rales. She exhibits no tenderness.  Abdominal: Soft. Bowel sounds are normal. She exhibits no distension and no mass. Non-tender. There is no rebound and no guarding.  Musculoskeletal: Normal range of motion. She exhibits no edema and no tenderness.  Neurological: She is alert. She has normal  sensation and normal strength. No cranial nerve deficit.  Skin: Skin is warm and dry. She is not diaphoretic.    Lab Results: Basic Metabolic Panel:  Recent Labs Lab 09/25/13 1113 09/26/13 0058  NA 136 136  K 3.5 3.5  CL 102 106  CO2 15* 17*  GLUCOSE 91 89  BUN 47* 46*  CREATININE 4.10* 3.91*  CALCIUM 9.1 8.3*  MG 2.3  --   PHOS 4.7*  --    Liver Function Tests:  Recent Labs Lab 09/25/13 1113 09/26/13 0058  AST 116* 71*  ALT 282* 213*  ALKPHOS 167* 132*  BILITOT 1.1 0.7  PROT 6.5 5.3*  ALBUMIN 3.3* 2.9*    Recent Labs Lab 09/25/13 1113  LIPASE 51    Recent Labs Lab 09/25/13 1656  AMMONIA 26   CBC:  Recent Labs Lab 09/25/13 1113 09/26/13 0058  WBC 5.9 6.2  NEUTROABS 3.7 3.3  HGB 11.6* 10.0*  HCT 34.3* 29.6*  MCV 88.4 86.5  PLT 198 174   Cardiac Enzymes:  Recent Labs Lab 09/25/13 1850 09/26/13 0058 09/26/13 0725  TROPONINI 0.35* 0.47* <0.30   BNP: No results found for this basename: PROBNP,  in the last 168 hours D-Dimer: No results found for this basename: DDIMER,  in the last 168 hours CBG: No results found for this basename: GLUCAP,  in the last 168 hours Hemoglobin A1C: No results found for this basename: HGBA1C,  in the last 168 hours  Fasting Lipid Panel: No results found for this basename: CHOL, HDL, LDLCALC, TRIG, CHOLHDL, LDLDIRECT,  in the last 168 hours Thyroid Function Tests: No results found for this basename: TSH, T4TOTAL, FREET4, T3FREE, THYROIDAB,  in the last 168 hours Coagulation:  Recent Labs Lab 09/25/13 1850  LABPROT 17.5*  INR 1.48   Anemia Panel: No results found for this basename: VITAMINB12, FOLATE, FERRITIN, TIBC, IRON, RETICCTPCT,  in the last 168 hours Urine Drug Screen: Drugs of Abuse     Component Value Date/Time   LABOPIA NEGATIVE 09/25/2012 2042   LABOPIA POSITIVE* 03/05/2012 [place] 09/25/2012 2042   COCAINSCRNUR NONE DETECTED 03/05/2012 [place] 09/25/2012  2042   LABBENZ NONE DETECTED 03/05/2012 1112   AMPHETMU NEGATIVE 09/25/2012 2042   AMPHETMU NONE DETECTED 03/05/2012 1112   THCU NONE DETECTED 03/05/2012 1112   LABBARB NONE DETECTED 03/05/2012 1112    Alcohol Level: No results found for this basename: ETH,  in the last 168 hours Urinalysis:  Recent Labs Lab 09/25/13 1213  COLORURINE YELLOW  LABSPEC 1.016  PHURINE 5.0  GLUCOSEU NEGATIVE  HGBUR LARGE*  BILIRUBINUR NEGATIVE  KETONESUR NEGATIVE  PROTEINUR >300*  UROBILINOGEN 0.2  NITRITE NEGATIVE  LEUKOCYTESUR TRACE*     Micro Results: Recent Results (from the past 240 hour(s))  MRSA PCR SCREENING     Status: None   Collection Time    09/25/13  6:05 PM      Result Value Range Status   MRSA by PCR NEGATIVE  NEGATIVE Final   Comment:            The GeneXpert MRSA Assay (FDA     approved for NASAL specimens     only), is one component of a     comprehensive MRSA colonization     surveillance program. It is not     intended to diagnose MRSA     infection nor to guide or     monitor treatment for     MRSA infections.   Studies/Results: Ct Head Wo Contrast  09/25/2013   CLINICAL DATA:  Nausea and vomiting. Right eye pain and visual disturbance. Ventriculoperitoneal shunt.  EXAM: CT HEAD WITHOUT CONTRAST  TECHNIQUE: Contiguous axial images were obtained from the base of the skull through the vertex without intravenous contrast.  COMPARISON:  03/24/2013.  FINDINGS: The right frontal ventriculoperitoneal shunt is unchanged. Normal sized ventricles with an interval decrease in size. Stable streak artifacts from aneurysm clips and coils at the skull base. Stable post craniotomy changes on the right. No intracranial hemorrhage, mass lesion or CT evidence of acute infarction. 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 Stable mild patchy white matter low density in both cerebral hemispheres.  IMPRESSION: 1. Stable right  ventriculoperitoneal shunt tube with an interval decrease in size of the ventricles. 2. 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 3. Stable mild chronic small vessel white matter ischemic changes in both cerebral hemispheres. 4. No acute abnormality.   Electronically Signed   By: Enrique Sack M.D.   On: 09/25/2013 17:06   US Abdomen Complete  09/25/2013   CLINICAL DATA:  Abdominal pain, nausea/vomiting  EXAM: ULTRASOUND ABDOMEN COMPLETE  COMPARISON:  None.  FINDINGS: Gallbladder:  Underdistended. Mild gallbladder wall thickening, measuring 7-8 mm. No gallstones or pericholecystic fluid. Negative sonographic Murphy's sign.  Common bile duct:  Diameter: 3 mm.  Liver:  No focal lesion identified. Within normal limits in parenchymal echogenicity.  IVC:  No abnormality visualized.  Pancreas:  Visualized portions are unremarkable.  Spleen:  Measures 3.8 cm  Right Kidney:  Length: 10.2 cm. Increased parenchymal echogenicity, suggesting medical renal disease. No hydronephrosis.  Left Kidney:  Length: 9.7 cm. Increased parenchymal echogenicity, suggesting medical renal disease. Two lower pole cysts measuring up to 1.2 x 1.6 x 1.4 cm. No hydronephrosis.  Abdominal aorta:  No aneurysm visualized.  Other findings:  Perihepatic ascites.  Bilateral pleural effusions.  IMPRESSION: Mild gallbladder wall thickening, nonspecific. No associated sonographic findings to suggest acute cholecystitis.  Perihepatic ascites.  Bilateral pleural effusions.   Electronically Signed   By: Julian Hy M.D.   On: 09/25/2013 14:40   Dg Chest Port 1 View  09/25/2013   CLINICAL DATA:  Chest pain, shortness of Breath  EXAM: PORTABLE CHEST - 1 VIEW  COMPARISON:  05/07/2013  FINDINGS: Cardiomegaly again noted. Right VP shunt catheter. No acute infiltrate or pleural effusion. No pulmonary edema. Mild left basilar atelectasis.  IMPRESSION: No acute infiltrate or pulmonary edema. Mild left  basilar atelectasis.   Electronically Signed   By: Lahoma Crocker M.D.   On: 09/25/2013 11:22   Medications: I have reviewed the patient's current medications. Scheduled Meds: . amLODipine  10 mg Oral Daily  . aspirin EC  325 mg Oral Daily  . calcitRIOL  0.25 mcg Oral Q M,W,F  . heparin subcutaneous  5,000 Units Subcutaneous Q8H  . hydrALAZINE  100 mg Oral TID  . isosorbide mononitrate  30 mg Oral Daily  . ondansetron (ZOFRAN) IV  4 mg Intravenous Q6H   Continuous Infusions: . sodium chloride 50 mL/hr at 09/26/13 0800   PRN Meds:.acetaminophen, acetaminophen, nitroGLYCERIN, ondansetron (ZOFRAN) IV, ondansetron Assessment/Plan: Danele Huisman is a 52 y.o. with PMH multiple admissions for severe HTN, s/p spontaneous intracerebral hemorrhage, s/p [facility] 03/2002, required V-P Shunt, CAD, s/p NSTEMI 11/2007, s/p DES to RCA, CKD-5 who presents with shortness of breath, found to have a BP of 260/130.   #Accelerated malignant hypertension - Patient admitted with BP 260/130 in the setting of medication noncompliance and decreased PO intake with nausea, vomiting, and diarrhea. Patient has a history of noncompliance with medications and follow up appointments leading to multiple admissions for hypertension emergency. Neurologically, she has no deficits today other than some possible mild confusion (though reports of cognitive impairment at baseline) and a subjective observation of redness in her right eye. Physical examination was unremarkable. Treatment of hypertension emergency is typically reduction of the BP by no more than 25 percent compared with baseline by the end of the first day of treatment; however her MAP was already reduced from 160 to 115 in the ED. We will observe her closely for symptoms of ischemic or hemorrhagic stroke in the first 24 hours, shooting for a blood pressure of [id number] systolic. Overnight she was successfully maintained within this systolic range with no signs or symptoms of neurologic  compromise. This afternoon we can liberalize her BP goal to 140/90.  - Stat head CT to rule out hemorrhage, VP shunt malfunction > No acute abnormality  - Neuro checks q4h  - Daily weights > 140lbs - Is and Os > net -150cc - Home Amlodipine 10mg  daily  - Home Hydralazine 100mg  TID  - Home Imdur 30mg  daily  - Home ASA 325mg  daily  - Restarted labetalol 200 mg 3 times a day as below - Holding home clonidine 0.2mg /24hr patch,  Lasix 80 mg twice a day,  - SL nitroglycerin prn  - IVF NS @50cc /hr  - Tylenol prn pain  - Zofran prn nausea  - PT/OT eval and treat > PT recommends 24 hour assistance but no PT follow up; patient lives with family - CBC, CMP in am  #New LFT abnormalities - CMP notable for elevated transaminases. Improved today as below. Abdominal US largely unremarkable: notable for mild gallbladder wall thickening, nonspecific. No associated sonographic findings to suggest acute cholecystitis. Perihepatic ascites. Bilateral pleural effusions. Likely 2/2 temporary ischemic damage from malignant HTN, now resolving with BP control. - Ammonia level to evaluate for hepatic encephalopathy given confusion > WNL - Hepatitis panel > Negative - Repeat CMP in am  Hepatic Function Panel     Component Value Date/Time   PROT 5.3* 09/26/2013 0058   ALBUMIN 2.9* 09/26/2013 0058   AST 71* 09/26/2013 0058   ALT 213* 09/26/2013 0058   ALKPHOS 132* 09/26/2013 0058   BILITOT 0.7 09/26/2013 0058   BILIDIR 0.1 04/18/2011 1207    #CKD stage 5 - Cr 4.1 on admission, unknown baseline but was 5.0-5.3 last admission. GFR 13. She is s/p right basilic vein transposition AV fistula on 05/06/13, but has not had dialysis or followed up with a nephrologist since then. Compliance is an issue for this patient. Improved to 3.9 today. - Continue Calcitriol 0.25mg  MWF  - Will need renal follow up as an outpatient, have reiterated importance of this to her  Creatinine, Ser  Date Value Range Status  09/26/2013 3.91* 0.50  - 1.10 mg/dL Final  09/25/2013 4.10* 0.50 - 1.10 mg/dL Final  05/18/2013 5.27* 0.50 - 1.10 mg/dL Final    #Mildly elevated troponin with history of coronary artery disease - Troponin 0.32 on admission>0.35>0.47>less than 0.30. Currently chest pain-free. Prior EKG with stable T-wave inversions in the lateral leads, but these actually were not present on admission. Repeat EKG with flattened T-wave in V6 and slight inversion in V5, but this was seen in prior strips. She does have history of drug eluting stent to the RCA. Her cardiologist is Dr. Aundra Dubin. Likely demand ischemia in setting of malignant hypertension, now resolving with BP control. - Restarting home labetalol 200mg  TID - Continue to monitor for s/sx ACS - She will need outpatient follow up  #Right eye subjective color change - Patient with subjective observation of redness in her right eye. Dr. Baird Cancer with opthalmology was curbsided and recommended BP management and outpatient follow up in his office (he is a retinal surgeon). He felt she likely did have a retinal hemorrhage, but nothing needed to be done emergently other than controlling the BP.  - Will arrange outpatient follow up with Dr. Baird Cancer   #?Diarrhea - Patient has reportedly (per daughter) had profuse NB diarrhea since Monday, up to 10 times per day. No recent antibiotic use but multiple hospital admissions in the past 6 months. Patient herself denies diarrhea and has had no episodes since admission. - D/c C. Difficile PCR   #Trichomonas infection - Seen on UA.  - Flagyl 2g x1 (safe in renal failure)  - Will counsel on safe sexual practices  #CHF, grade 2 diastolic dysfunction - 2D echo in 05/07/13 showed EF 60% to [id number], grade 2 diastolic dysfunction, small pericardial effusion with no hemodynamic sequelae. She is not on an ACE inhibitor secondary to her progressive CKD. CXR with No acute infiltrate or pulmonary edema. Physical exam not suggestive of volume overload. No LE edema,  JVD,  symptoms of orthopnea or PND.  - Continue home medications as above   #History of noncompliance with medical treatment, presenting hazards to health - Has been a chronic issue for her.  - Social work consult  #Nutrition - Renal 60/70  #DVT PPX - SCDs   Dispo: Disposition is deferred at this time, awaiting improvement of current medical problems.  Anticipated discharge in approximately 1-3 day(s).   The patient does not know have a current PCP (No Pcp Per Patient) and does not know need an [facility] hospital follow-up appointment after discharge.  The patient does not have transportation limitations that hinder transportation to clinic appointments.  .Services Needed at time of discharge: Y = Yes, Blank = No PT:   OT:   RN:   Equipment:   Other:     LOS: 1 day   Lesly Dukes, MD 09/26/2013, 9:00 AM

## 2013-09-27 LAB — BASIC METABOLIC PANEL
BUN: 40 mg/dL — ABNORMAL HIGH (ref 6–23)
Calcium: 8.6 mg/dL (ref 8.4–10.5)
Creatinine, Ser: 3.89 mg/dL — ABNORMAL HIGH (ref 0.50–1.10)
GFR calc Af Amer: 14 mL/min — ABNORMAL LOW (ref >90)
Potassium: 3.4 mEq/L — ABNORMAL LOW (ref 3.5–5.1)

## 2013-09-27 MED ORDER — DIPHENHYDRAMINE HCL 25 MG PO CAPS
25.0000 mg | ORAL_CAPSULE | Freq: Once | ORAL | Status: AC
  Administered 2013-09-27: 25 mg via ORAL
  Filled 2013-09-27: qty 1

## 2013-09-27 MED ORDER — FUROSEMIDE 80 MG PO TABS
80.0000 mg | ORAL_TABLET | Freq: Two times a day (BID) | ORAL | Status: DC
  Administered 2013-09-27: 80 mg via ORAL
  Filled 2013-09-27 (×4): qty 1

## 2013-09-27 MED ORDER — CLONIDINE HCL 0.2 MG/24HR TD PTWK
0.2000 mg | MEDICATED_PATCH | TRANSDERMAL | Status: DC
  Filled 2013-09-27: qty 1

## 2013-09-27 NOTE — Discharge Summary (Signed)
Name: [name] MRN: [id number] DOB: 20-Apr-1961 52 y.o. PCP: No Pcp Per Patient  Date of Admission: 09/25/2013 10:34 AM Date of Discharge: 09/27/2013 Attending Physician: Madilyn Fireman, MD  Discharge Diagnosis: 1. Accelerated malignant hypertension 2. New LFT abnormalities 3. CKD stage 5  4. Mildly elevated troponin with history of coronary artery disease 5. Right eye subjective color change 6. Trichomonas infection 7. CHF, grade 2 diastolic dysfunction 8. History of noncompliance with medical treatment, presenting hazards to health  Discharge Medications:   Medication List    STOP taking these medications       cloNIDine 0.2 mg/24hr patch  Commonly known as:  CATAPRES - Dosed in mg/24 hr      TAKE these medications       acetaminophen 325 MG tablet  Commonly known as:  TYLENOL  Take 650 mg by mouth every 6 (six) hours as needed for pain (headache).     amLODipine 10 MG tablet  Commonly known as:  NORVASC  Take 1 tablet (10 mg total) by mouth daily.     calcitRIOL 0.25 MCG capsule  Commonly known as:  ROCALTROL  Take 1 capsule (0.25 mcg total) by mouth every Monday, Wednesday, and Friday.     camphor-menthol lotion  Commonly known as:  SARNA  Apply topically as needed for itching.     furosemide 80 MG tablet  Commonly known as:  LASIX  Take 1 tablet (80 mg total) by mouth 2 (two) times daily.     hydrALAZINE 100 MG tablet  Commonly known as:  APRESOLINE  Take 1 tablet (100 mg total) by mouth 3 (three) times daily.     isosorbide mononitrate 30 MG 24 hr tablet  Commonly known as:  IMDUR  Take 30 mg by mouth daily.     labetalol 200 MG tablet  Commonly known as:  NORMODYNE  Take 1 tablet (200 mg total) by mouth 3 (three) times daily.     nitroGLYCERIN 0.4 MG SL tablet  Commonly known as:  NITROSTAT  Place 1 tablet (0.4 mg total) under the tongue every 5 (five) minutes as needed for chest pain.        Disposition and follow-up:   [name] was discharged from [facility] in Stable condition.  At the hospital follow up visit please address:  1.  BP control.  2.  Labs / imaging needed at time of follow-up: BMP  3.  Pending labs/ test needing follow-up: None  Follow-up Appointments: Follow-up Information   Follow up with [facility] B[facility] PA-C On 10/18/2013. (@3 :15pm. This is your kidney doctor. Please call if you cannot make this appointment.)    Specialty:  Nephrology   Contact information:   [place] [address] [phone]       Follow up with Richardson Dopp, PA-C On 10/11/2013. (@11 :50am. This is your heart doctor.)    Specialty:  Physician Assistant   Contact information:   [id number] [place] [address] [phone]       Follow up with Corliss Parish, MD On 10/10/2013. (@8 :30. This is your eye doctor.)    Specialty:  Ophthalmology   Contact information:   [place] [address] [phone]       Follow up with Winfield Cunas, MD On 10/04/2013. (@2 :00pm. This is your neurosurgeon.)    Specialty:  Neurosurgery   Contact information:   [address], [address]  [place] [address] [phone]       Discharge Instructions: Discharge Orders   Future Appointments Provider Department Dept Phone   10/11/2013 11:50 AM Liliane Shi, PA-C [facility] [phone]   Future Orders Complete By Expires   Diet - low sodium heart healthy  As directed    Increase activity slowly  As directed       Consultations:  None  Procedures Performed:  Ct Head Wo Contrast  09/25/2013   CLINICAL DATA:  Nausea and vomiting. Right eye pain and visual disturbance. Ventriculoperitoneal shunt.  EXAM: CT HEAD WITHOUT CONTRAST  TECHNIQUE: Contiguous axial images were obtained from the base of the skull through the vertex without intravenous contrast.  COMPARISON:  03/24/2013.   FINDINGS: The right frontal ventriculoperitoneal shunt is unchanged. Normal sized ventricles with an interval decrease in size. Stable streak artifacts from aneurysm clips and coils at the skull base. Stable post craniotomy changes on the right. No intracranial hemorrhage, mass lesion or CT evidence of acute infarction. 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 Stable mild patchy white matter low density in both cerebral hemispheres.  IMPRESSION: 1. Stable right ventriculoperitoneal shunt tube with an interval decrease in size of the ventricles. 2. 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 3. Stable mild chronic small vessel white matter ischemic changes in both cerebral hemispheres. 4. No acute abnormality.   Electronically Signed   By: Enrique Sack M.D.   On: 09/25/2013 17:06   US Abdomen Complete  09/25/2013   CLINICAL DATA:  Abdominal pain, nausea/vomiting  EXAM: ULTRASOUND ABDOMEN COMPLETE  COMPARISON:  None.  FINDINGS: Gallbladder:  Underdistended. Mild gallbladder wall thickening, measuring 7-8 mm. No gallstones or pericholecystic fluid. Negative sonographic Murphy's sign.  Common bile duct:  Diameter: 3 mm.  Liver:  No focal lesion identified. Within normal limits in parenchymal echogenicity.  IVC:  No abnormality visualized.  Pancreas:  Visualized portions are unremarkable.  Spleen:  Measures 3.8 cm  Right Kidney:  Length: 10.2 cm. Increased parenchymal echogenicity, suggesting medical renal disease. No hydronephrosis.  Left Kidney:  Length: 9.7 cm. Increased parenchymal echogenicity, suggesting medical renal disease. Two lower pole cysts measuring up to 1.2 x 1.6 x 1.4 cm. No hydronephrosis.  Abdominal aorta:  No aneurysm visualized.  Other findings:  Perihepatic ascites.  Bilateral pleural effusions.  IMPRESSION: Mild gallbladder wall thickening, nonspecific. No associated sonographic findings  to suggest acute cholecystitis.  Perihepatic ascites.  Bilateral pleural effusions.   Electronically Signed   By: Julian Hy M.D.   On: 09/25/2013 14:40   Dg Chest Port 1 View  09/25/2013   CLINICAL DATA:  Chest pain, shortness of Breath  EXAM: PORTABLE CHEST - 1 VIEW  COMPARISON:  05/07/2013  FINDINGS: Cardiomegaly again noted. Right VP shunt catheter. No acute infiltrate or pleural effusion. No pulmonary edema. Mild left basilar atelectasis.  IMPRESSION: No acute infiltrate or pulmonary edema. Mild left basilar atelectasis.   Electronically Signed   By: Lahoma Crocker M.D.   On: 09/25/2013 11:22    Admission HPI:  [name] is a 52 y.o. with PMH multiple admissions for severe HTN, s/p spontaneous intracerebral hemorrhage, s/p [facility] 03/2002, required V-P Shunt, CAD, s/p NSTEMI 11/2007, s/p DES to RCA, CKD-5 who presents with shortness of breath, found to have a BP of 260/130. History was obtained from patient and daughter, who was  at the bedside. Patient has some baseline cognitive impairment.   The patient reports that she has felt "bad" since Monday. Her symptoms started as nausea, vomiting, and diarrhea. Vomitus is NBNB, unclear frequency but none today. As far as the diarrhea she has had up to 10 episodes of NB loose stool per day per the daughter (patient herself initially denied having diarrhea). She reports being very hungry, but per daughter she has had anorexia and decreased PO intake all week. She admits to not taking her medications as prescribed this week. Since Tuesday, she also reports shortness of breath and a "red color" in her right eye. Denies headache, chest pain, weakness, numbness, tingling. She does report feeling a little confused. Denies dysuria. She endorses mild abdominal pain in her epigastric region.   She was admitted to the hospitalist service twice in 7/14 for malignant hypertension in the setting of medication noncompliance. During her first admission, she developed AKI  on top of CKD. [facility] saw her in consultation and performed a right basilic vein transposition AV fistula on 05/06/13. She was supposed to follow up with [facility] in August but did not. In fact she has not followed with renal or neurosurgery (Dr. Christella Noa) as an outpatient in the past year. No history of cocaine use.   In the ED she was was given IV labetalol 60mg , and after this her BP decreased to 148/80. Her home meds were restarted. BP was 188/95 on our examination.  Physical Exam:  Blood pressure 170/90, pulse 66, temperature 97.6 F (36.4 C), temperature source Oral, resp. rate 17, height 5\' 1"  (1.549 m), weight 140 lb 10.5 oz (63.8 kg), SpO2 100.00%.  Physical Exam  Constitutional: She is well-developed, well-nourished, and in no distress.  HENT:  Head: Normocephalic and atraumatic.  Dry mucous membranes  Eyes: Right eye visual fields normal and left eye visual fields normal. Conjunctivae and EOM are normal. Pupils are equal, round, and reactive to light. Right eye exhibits no discharge and no exudate. Left eye exhibits no discharge and no exudate. Right conjunctiva is not injected. Right conjunctiva has no hemorrhage. Left conjunctiva is not injected. Left conjunctiva has no hemorrhage.  Fundoscopic exam without gross abnormality but unable to obtain clear view of disc.  Neck: Normal range of motion. Neck supple.  Cardiovascular: Normal rate, regular rhythm, normal heart sounds and intact distal pulses. Exam reveals no gallop and no friction rub.  No murmur heard.  Pulmonary/Chest: Effort normal and breath sounds normal. No respiratory distress. She has no wheezes. She has no rales. She exhibits no tenderness.  Abdominal: Soft. Bowel sounds are normal. She exhibits no distension and no mass. There is tenderness (Mildly tender in epigastric region). There is no rebound and no guarding.  Musculoskeletal: Normal range of motion. She exhibits no edema and no tenderness.  Neurological: She  is alert. She has normal sensation and normal strength. No cranial nerve deficit.  GCS 14, losing points for mild confusion. Knows place and name but thinks date is "2016 or 2015".  Skin: Skin is warm and dry. She is not diaphoretic.  Psychiatric:  Distant affect    Hospital Course by problem list: 1. Accelerated malignant hypertension - Patient admitted with BP 260/130 in the setting of medication noncompliance and decreased PO intake with nausea, vomiting, and diarrhea. Patient has a history of noncompliance with medications and follow up appointments leading to multiple admissions for hypertension emergency. Neurologically, she had no deficits other than some possible mild confusion (though  reports of cognitive impairment at baseline) and a subjective observation of redness in her right eye. Physical examination was unremarkable. Stat head CT was performed to rule out hemorrhage and VP shunt malfunction. It showed no acute abnormality. We slowly reduced her BP over the next 48 hours by adding back her home medications one by one: Amlodipine 10mg  daily, Hydralazine 100mg  TID, Imdur 30mg  daily, labetalol 200 mg 3 times a day, Lasix 80 mg twice a day. We discontinued her home clonidine patch given sufficient BP control on other medications, and risk of rebound HTN with noncompliance. On discharge, BP was 140-160s/70s. PT/OT saw her an recommended no follow up.  2. New LFT abnormalities - CMP on admission was notable for elevated transaminases (AST/ALT 116/282). These trended down to the levels below with BP control. Abdominal US performed and largely unremarkable: notable for mild gallbladder wall thickening, nonspecific. No associated sonographic findings to suggest acute cholecystitis. Perihepatic ascites. Bilateral pleural effusions. Ammonia level was normal. Hepatitis panel was negative. We felt LFT elevation was likely 2/2 temporary ischemic damage from malignant HTN.   Hepatic Function Panel       Component Value Date/Time   PROT 5.3* 09/26/2013 0058   ALBUMIN 2.9* 09/26/2013 0058   AST 71* 09/26/2013 0058   ALT 213* 09/26/2013 0058   ALKPHOS 132* 09/26/2013 0058   BILITOT 0.7 09/26/2013 0058   BILIDIR 0.1 04/18/2011 1207    3. CKD stage 5 - Cr 4.1 on admission, unknown baseline but was 5.0-5.3 last admission. GFR 13. She is s/p right basilic vein transposition AV fistula on 05/06/13, but has not had dialysis or followed up with a nephrologist since then. Compliance is an issue for this patient. Cr improved over time as below. We continue her Calcitriol 0.25mg  MWF. She will need renal follow up as an outpatient, I have reiterated the importance of this to her and made her an appointment with [facility].  Creatinine, Ser   Date  Value  Range  Status   09/27/2013  3.89*  0.50 - 1.10 mg/dL  Final   09/26/2013  3.91*  0.50 - 1.10 mg/dL  Final   09/25/2013  4.10*  0.50 - 1.10 mg/dL  Final    4. Mildly elevated troponin with history of coronary artery disease - Troponin 0.32 on admission>0.35>0.47>less than 0.30. She was chest pain-free. Prior EKG with stable T-wave inversions in the lateral leads. These actually were not present on admission. Repeat EKG with flattened T-wave in V6 and slight inversion in V5, but as I mentioned this was seen in prior strips. She does have history of drug eluting stent to the RCA. Her cardiologist is Dr. Aundra Dubin. We felt her troponinemia was likely from demand ischemia in setting of malignant hypertension, resolving with BP control. We restarted home labetalol 200mg  TID. Outpatient cardiology follow up was arranged.  5. Right eye subjective color change - Patient with subjective observation of redness in her right eye. Visual fields were intact, EOMI, PERRL, fundoscopic exam limited but grossly normal. Dr. Baird Cancer with opthalmology was curbsided and recommended BP management and outpatient follow up in his office (he is a retinal surgeon). He felt she likely did have  a retinal hemorrhage, but nothing needed to be done emergently other than controlling the BP. I have arranged outpatient follow up with Dr. Baird Cancer.   6. Trichomonas infection - Seen on UA. Gave Flagyl 2g x1 (safe in renal failure)   7. CHF, grade 2 diastolic dysfunction - 2D echo in  05/07/13 showed EF 60% to [id number], grade 2 diastolic dysfunction, small pericardial effusion with no hemodynamic sequelae. She is not on an ACE inhibitor secondary to her progressive CKD. CXR with No acute infiltrate or pulmonary edema. Physical exam not suggestive of volume overload. No LE edema, JVD, symptoms of orthopnea or PND. We continued home medications as above   8. History of noncompliance with medical treatment, presenting hazards to health - Has been a chronic issue for her. Reiterated importance of compliance multiple times.     Discharge Vitals:   BP 168/78  Pulse 67  Temp(Src) 98.1 F (36.7 C) (Oral)  Resp 15  Ht 5\' 1"  (1.549 m)  Wt 137 lb 1.6 oz (62.188 kg)  BMI 25.92 kg/m2  SpO2 99%  Discharge Labs:  Results for orders placed during the hospital encounter of 09/25/13 (from the past 24 hour(s))  BASIC METABOLIC PANEL     Status: Abnormal   Collection Time    09/27/13  4:45 AM      Result Value Range   Sodium 137  135 - 145 mEq/L   Potassium 3.4 (*) 3.5 - 5.1 mEq/L   Chloride 107  96 - 112 mEq/L   CO2 16 (*) 19 - 32 mEq/L   Glucose, Bld 92  70 - 99 mg/dL   BUN 40 (*) 6 - 23 mg/dL   Creatinine, Ser 3.89 (*) 0.50 - 1.10 mg/dL   Calcium 8.6  8.4 - 10.5 mg/dL   GFR calc non Af Amer 12 (*) >90 mL/min   GFR calc Af Amer 14 (*) >90 mL/min    Signed: Lesly Dukes, MD 09/27/2013, 2:54 PM   Time Spent on Discharge: 30 minutes Services Ordered on Discharge: None Equipment Ordered on Discharge: None

## 2013-09-27 NOTE — Progress Notes (Signed)
Subjective: Patient seen and evaluated at the bedside. Denies nausea, vomiting, diarrhea. Denies weakness, numbness, new visual changes. She is still seeing red out of her right eye. No chest pain or shortness of breath. We talked about the importance of following up with her doctors and she nodded in agreement.  Objective: Vital signs in last 24 hours: Filed Vitals:   09/26/13 1656 09/26/13 1858 09/26/13 2337 09/27/13 0444  BP: 156/85 148/77 152/72 171/76  Pulse: 65 65 65 63  Temp:  98.3 F (36.8 C) 97.7 F (36.5 C) 97.4 F (36.3 C)  TempSrc:  Oral Oral Oral  Resp: 18 18 26 15   Height:    5\' 1"  (1.549 m)  Weight:    137 lb 1.6 oz (62.188 kg)  SpO2: 100% 99% 98% 99%   Weight change: 3 lb 8 oz (1.588 kg)  Intake/Output Summary (Last 24 hours) at 09/27/13 0757 Last data filed at 09/27/13 0444  Gross per 24 hour  Intake    360 ml  Output    600 ml  Net   -240 ml   Physical Exam  Constitutional: She is well-developed, well-nourished, and in no distress.  HENT:  Head: Normocephalic and atraumatic.  Eyes: Right eye visual fields normal and left eye visual fields normal. Conjunctivae and EOM are normal. Pupils are equal, round, and reactive to light. Right eye exhibits no discharge and no exudate. Left eye exhibits no discharge and no exudate. Right conjunctiva is not injected. Right conjunctiva has no hemorrhage. Left conjunctiva is not injected. Left conjunctiva has no hemorrhage.  Neck: Normal range of motion. Neck supple.  Cardiovascular: Normal rate, regular rhythm, normal heart sounds and intact distal pulses. Exam reveals no gallop and no friction rub.  No murmur heard.  Pulmonary/Chest: Effort normal and breath sounds normal. No respiratory distress. She has no wheezes. She has no rales. She exhibits no tenderness.  Abdominal: Soft. Bowel sounds are normal. She exhibits no distension and no mass. Non-tender. There is no rebound and no guarding.  Musculoskeletal: Normal  range of motion. She exhibits no edema and no tenderness.  Neurological: She is alert. She has normal sensation and normal strength. No cranial nerve deficit.  Skin: Skin is warm and dry. She is not diaphoretic.    Lab Results: Basic Metabolic Panel:  Recent Labs Lab 09/25/13 1113 09/26/13 0058 09/27/13 0445  NA 136 136 137  K 3.5 3.5 3.4*  CL 102 106 107  CO2 15* 17* 16*  GLUCOSE 91 89 92  BUN 47* 46* 40*  CREATININE 4.10* 3.91* 3.89*  CALCIUM 9.1 8.3* 8.6  MG 2.3  --   --   PHOS 4.7*  --   --    Liver Function Tests:  Recent Labs Lab 09/25/13 1113 09/26/13 0058  AST 116* 71*  ALT 282* 213*  ALKPHOS 167* 132*  BILITOT 1.1 0.7  PROT 6.5 5.3*  ALBUMIN 3.3* 2.9*    Recent Labs Lab 09/25/13 1113  LIPASE 51    Recent Labs Lab 09/25/13 1656  AMMONIA 26   CBC:  Recent Labs Lab 09/25/13 1113 09/26/13 0058  WBC 5.9 6.2  NEUTROABS 3.7 3.3  HGB 11.6* 10.0*  HCT 34.3* 29.6*  MCV 88.4 86.5  PLT 198 174   Cardiac Enzymes:  Recent Labs Lab 09/25/13 1850 09/26/13 0058 09/26/13 0725  TROPONINI 0.35* 0.47* <0.30   BNP: No results found for this basename: PROBNP,  in the last 168 hours D-Dimer: No results found for this  basename: DDIMER,  in the last 168 hours CBG: No results found for this basename: GLUCAP,  in the last 168 hours Hemoglobin A1C: No results found for this basename: HGBA1C,  in the last 168 hours Fasting Lipid Panel: No results found for this basename: CHOL, HDL, LDLCALC, TRIG, CHOLHDL, LDLDIRECT,  in the last 168 hours Thyroid Function Tests: No results found for this basename: TSH, T4TOTAL, FREET4, T3FREE, THYROIDAB,  in the last 168 hours Coagulation:  Recent Labs Lab 09/25/13 1850  LABPROT 17.5*  INR 1.48   Anemia Panel: No results found for this basename: VITAMINB12, FOLATE, FERRITIN, TIBC, IRON, RETICCTPCT,  in the last 168 hours Urine Drug Screen: Drugs of Abuse     Component Value Date/Time   LABOPIA NEGATIVE  09/25/2012 2042   LABOPIA POSITIVE* 03/05/2012 [place] 09/25/2012 2042   COCAINSCRNUR NONE DETECTED 03/05/2012 [place] 09/25/2012 2042   LABBENZ NONE DETECTED 03/05/2012 1112   AMPHETMU NEGATIVE 09/25/2012 2042   AMPHETMU NONE DETECTED 03/05/2012 1112   THCU NONE DETECTED 03/05/2012 1112   LABBARB NONE DETECTED 03/05/2012 1112    Alcohol Level: No results found for this basename: ETH,  in the last 168 hours Urinalysis:  Recent Labs Lab 09/25/13 1213  COLORURINE YELLOW  LABSPEC 1.016  PHURINE 5.0  GLUCOSEU NEGATIVE  HGBUR LARGE*  BILIRUBINUR NEGATIVE  KETONESUR NEGATIVE  PROTEINUR >300*  UROBILINOGEN 0.2  NITRITE NEGATIVE  LEUKOCYTESUR TRACE*     Micro Results: Recent Results (from the past 240 hour(s))  MRSA PCR SCREENING     Status: None   Collection Time    09/25/13  6:05 PM      Result Value Range Status   MRSA by PCR NEGATIVE  NEGATIVE Final   Comment:            The GeneXpert MRSA Assay (FDA     approved for NASAL specimens     only), is one component of a     comprehensive MRSA colonization     surveillance program. It is not     intended to diagnose MRSA     infection nor to guide or     monitor treatment for     MRSA infections.   Studies/Results: Ct Head Wo Contrast  09/25/2013   CLINICAL DATA:  Nausea and vomiting. Right eye pain and visual disturbance. Ventriculoperitoneal shunt.  EXAM: CT HEAD WITHOUT CONTRAST  TECHNIQUE: Contiguous axial images were obtained from the base of the skull through the vertex without intravenous contrast.  COMPARISON:  03/24/2013.  FINDINGS: The right frontal ventriculoperitoneal shunt is unchanged. Normal sized ventricles with an interval decrease in size. Stable streak artifacts from aneurysm clips and coils at the skull base. Stable post craniotomy changes on the right. No intracranial hemorrhage, mass lesion or CT evidence of acute infarction. Interval visualization of an old lacunar infarct at  the inferior aspect of the left caudate head. Stable mild patchy white matter low density in both cerebral hemispheres.  IMPRESSION: 1. Stable right ventriculoperitoneal shunt tube with an interval decrease in size of the ventricles. 2. 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 3. Stable mild chronic small vessel white matter ischemic changes in both cerebral hemispheres. 4. No acute abnormality.   Electronically Signed   By: Enrique Sack M.D.   On: 09/25/2013 17:06   US Abdomen Complete  09/25/2013   CLINICAL DATA:  Abdominal pain, nausea/vomiting  EXAM:  ULTRASOUND ABDOMEN COMPLETE  COMPARISON:  None.  FINDINGS: Gallbladder:  Underdistended. Mild gallbladder wall thickening, measuring 7-8 mm. No gallstones or pericholecystic fluid. Negative sonographic Murphy's sign.  Common bile duct:  Diameter: 3 mm.  Liver:  No focal lesion identified. Within normal limits in parenchymal echogenicity.  IVC:  No abnormality visualized.  Pancreas:  Visualized portions are unremarkable.  Spleen:  Measures 3.8 cm  Right Kidney:  Length: 10.2 cm. Increased parenchymal echogenicity, suggesting medical renal disease. No hydronephrosis.  Left Kidney:  Length: 9.7 cm. Increased parenchymal echogenicity, suggesting medical renal disease. Two lower pole cysts measuring up to 1.2 x 1.6 x 1.4 cm. No hydronephrosis.  Abdominal aorta:  No aneurysm visualized.  Other findings:  Perihepatic ascites.  Bilateral pleural effusions.  IMPRESSION: Mild gallbladder wall thickening, nonspecific. No associated sonographic findings to suggest acute cholecystitis.  Perihepatic ascites.  Bilateral pleural effusions.   Electronically Signed   By: Julian Hy M.D.   On: 09/25/2013 14:40   Dg Chest Port 1 View  09/25/2013   CLINICAL DATA:  Chest pain, shortness of Breath  EXAM: PORTABLE CHEST - 1 VIEW  COMPARISON:  05/07/2013  FINDINGS: Cardiomegaly again noted. Right VP shunt catheter. No  acute infiltrate or pleural effusion. No pulmonary edema. Mild left basilar atelectasis.  IMPRESSION: No acute infiltrate or pulmonary edema. Mild left basilar atelectasis.   Electronically Signed   By: Lahoma Crocker M.D.   On: 09/25/2013 11:22   Medications: I have reviewed the patient's current medications. Scheduled Meds: . amLODipine  10 mg Oral Daily  . aspirin EC  325 mg Oral Daily  . calcitRIOL  0.25 mcg Oral Q M,W,F  . cloNIDine  0.2 mg Transdermal Weekly  . furosemide  80 mg Oral BID  . heparin subcutaneous  5,000 Units Subcutaneous Q8H  . hydrALAZINE  100 mg Oral TID  . isosorbide mononitrate  30 mg Oral Daily  . labetalol  200 mg Oral TID   Continuous Infusions:   PRN Meds:.acetaminophen, acetaminophen, nitroGLYCERIN, ondansetron (ZOFRAN) IV, ondansetron Assessment/Plan: Cathren Vandoorn is a 52 y.o. with PMH multiple admissions for severe HTN, s/p spontaneous intracerebral hemorrhage, s/p SAH 03/2002, required V-P Shunt, CAD, s/p NSTEMI 11/2007, s/p DES to RCA, CKD-5 who presents with shortness of breath, found to have a BP of 260/130.   #Accelerated malignant hypertension - Patient admitted with BP 260/130 in the setting of medication noncompliance and decreased PO intake with nausea, vomiting, and diarrhea. Patient has a history of noncompliance with medications and follow up appointments leading to multiple admissions for hypertension emergency. Neurologically, she has no deficits today other than some possible mild confusion (though reports of cognitive impairment at baseline) and a subjective observation of redness in her right eye. Physical examination was unremarkable. Treatment of hypertension emergency is typically reduction of the BP by no more than 25 percent compared with baseline by the end of the first day of treatment; however her MAP was already reduced from 160 to 115 in the ED. BP 140s/70s this am. - Stat head CT to rule out hemorrhage, VP shunt malfunction > No acute  abnormality  - Neuro checks q4h  - Daily weights > 140lbs > 137 > 137 - Is and Os > UOP 600cc yesterday, net -643cc since admission - Home Amlodipine 10mg  daily  - Home Hydralazine 100mg  TID  - Home Imdur 30mg  daily  - Home ASA 325mg  daily  - Home labetalol 200 mg 3 times a day as below - Restarting home  Lasix 80 mg twice a day - Discontinuing home clonidine given risk of rebound HTN with noncompliance - SL nitroglycerin prn  - Tylenol prn pain  - Zofran prn nausea  - PT/OT eval and treat > PT recommends 24 hour assistance but no PT follow up; patient lives with family and has people there all day. OT no follow up. - Renal diet - Medically stable for discharge home - She will need cardiology, renal, eye, and neurosurgery follow up at discharge  #New LFT abnormalities - CMP notable for elevated transaminases. Downtrending as below. Abdominal US largely unremarkable: notable for mild gallbladder wall thickening, nonspecific. No associated sonographic findings to suggest acute cholecystitis. Perihepatic ascites. Bilateral pleural effusions. Likely 2/2 temporary ischemic damage from malignant HTN, now resolving with BP control. - Ammonia level to evaluate for hepatic encephalopathy given confusion > WNL - Hepatitis panel > Negative  Hepatic Function Panel     Component Value Date/Time   PROT 5.3* 09/26/2013 0058   ALBUMIN 2.9* 09/26/2013 0058   AST 71* 09/26/2013 0058   ALT 213* 09/26/2013 0058   ALKPHOS 132* 09/26/2013 0058   BILITOT 0.7 09/26/2013 0058   BILIDIR 0.1 04/18/2011 1207    #CKD stage 5 - Cr 4.1 on admission, unknown baseline but was 5.0-5.3 last admission. GFR 13. She is s/p right basilic vein transposition AV fistula on 05/06/13, but has not had dialysis or followed up with a nephrologist since then. Compliance is an issue for this patient. Improving as below. - Continue Calcitriol 0.25mg  MWF  - Will need renal follow up as an outpatient for dialysis, have reiterated  importance of this to her  Creatinine, Ser  Date Value Range Status  09/27/2013 3.89* 0.50 - 1.10 mg/dL Final  09/26/2013 3.91* 0.50 - 1.10 mg/dL Final  09/25/2013 4.10* 0.50 - 1.10 mg/dL Final    #Mildly elevated troponin with history of coronary artery disease - Troponin 0.32 on admission>0.35>0.47>less than 0.30. Currently chest pain-free. Prior EKG with stable T-wave inversions in the lateral leads, but these actually were not present on admission. Repeat EKG with flattened T-wave in V6 and slight inversion in V5, but this was seen in prior strips. She does have history of drug eluting stent to the RCA. Her cardiologist is Dr. Aundra Dubin. Likely demand ischemia in setting of malignant hypertension, now resolving with BP control. - Restarting home labetalol 200mg  TID - Continue to monitor for s/sx ACS - She will need outpatient follow up  #Right eye subjective color change - Patient with subjective observation of redness in her right eye. Dr. Baird Cancer with opthalmology was curbsided and recommended BP management and outpatient follow up in his office (he is a retinal surgeon). He felt she likely did have a retinal hemorrhage, but nothing needed to be done emergently other than controlling the BP.  - Will arrange outpatient follow up with Dr. Baird Cancer   #?Diarrhea - Patient has reportedly (per daughter) had profuse NB diarrhea since Monday, up to 10 times per day. No recent antibiotic use but multiple hospital admissions in the past 6 months. Patient herself denies diarrhea and has had no episodes since admission. - D/c C. Difficile PCR   #Trichomonas infection - Seen on UA.  - Flagyl 2g x1 (safe in renal failure)  - Will counsel on safe sexual practices  #CHF, grade 2 diastolic dysfunction - 2D echo in 05/07/13 showed EF 60% to [id number], grade 2 diastolic dysfunction, small pericardial effusion with no hemodynamic sequelae. She is not on an  ACE inhibitor secondary to her progressive CKD. CXR with No  acute infiltrate or pulmonary edema. Physical exam not suggestive of volume overload. No LE edema, JVD, symptoms of orthopnea or PND.  - Continue home medications as above   #History of noncompliance with medical treatment, presenting hazards to health - Has been a chronic issue for her.  - Social work consult  #Nutrition - Renal 60/70  #DVT PPX - SCDs   Dispo: Disposition is deferred at this time, awaiting improvement of current medical problems.  Anticipated discharge in approximately 1-3 day(s).   The patient does not know have a current PCP (No Pcp Per Patient) and does not know need an [facility] hospital follow-up appointment after discharge.  The patient does not have transportation limitations that hinder transportation to clinic appointments.  .Services Needed at time of discharge: Y = Yes, Blank = No PT:   OT:   RN:   Equipment:   Other:     LOS: 2 days   Lesly Dukes, MD 09/27/2013, 7:57 AM

## 2013-09-27 NOTE — Progress Notes (Signed)
    Day 2 of stay      Patient name: [name]  Medical record number: [id number]  Date of birth: [birth year]  Summary: 52 y.o. female with malignant hypertension, now doing well. Other issues: CKD, right eye red spot in vision. She is doing very well in relation to BP on her current regimen - Amlodipine, Hydralazine, Imdur, labetolol.    09/27/13 0800 09/27/13 0935 09/27/13 1200  BP: 142/71 142/72 168/78  Pulse:  67   Temp: 97.6 F (36.4 C)  98.1 F (36.7 C)   She will need appropriate follow ups for ophthalmology, PCP and renal on discharge. She will be discharged to home with OT follow up as per recommendations.   I have seen and evaluated this patient and discussed it with my IM resident team.  Please see the rest of the plan per resident note from today.   [place], [place] 09/27/2013, 2:56 PM.

## 2013-09-27 NOTE — Progress Notes (Signed)
Patient being discharged home per MD order. All discharge instructions given to patient and voiced understanding, Daughter called to come pick up patient to bring home. Patient alert and oriented, VSS.

## 2013-09-29 NOTE — Discharge Summary (Signed)
   Date: 09/29/2013    Patient name: [name]  MRN: [id number]  Date of birth: July 08, 1961  I evaluated the patient on the day of discharge and discussed the discharge plan with my resident team. I agree with the discharge documentation and disposition.     [place], Roy 09/29/2013, 2:50 PM

## 2013-10-11 ENCOUNTER — Encounter: Payer: No Typology Code available for payment source | Admitting: Physician Assistant

## 2013-10-13 ENCOUNTER — Emergency Department (HOSPITAL_COMMUNITY): Payer: No Typology Code available for payment source

## 2013-10-13 ENCOUNTER — Observation Stay (HOSPITAL_COMMUNITY)
Admission: EM | Admit: 2013-10-13 | Discharge: 2013-10-14 | Disposition: A | Discharge status: Home / Self Care | Payer: No Typology Code available for payment source | Attending: Internal Medicine | Admitting: Internal Medicine

## 2013-10-13 ENCOUNTER — Encounter (HOSPITAL_COMMUNITY): Payer: Self-pay | Admitting: Emergency Medicine

## 2013-10-13 ENCOUNTER — Other Ambulatory Visit: Payer: Self-pay

## 2013-10-13 DIAGNOSIS — Z720 Tobacco use: Secondary | ICD-10-CM | POA: Diagnosis present

## 2013-10-13 DIAGNOSIS — N184 Chronic kidney disease, stage 4 (severe): Secondary | ICD-10-CM

## 2013-10-13 DIAGNOSIS — I160 Hypertensive urgency: Secondary | ICD-10-CM

## 2013-10-13 DIAGNOSIS — I129 Hypertensive chronic kidney disease with stage 1 through stage 4 chronic kidney disease, or unspecified chronic kidney disease: Secondary | ICD-10-CM | POA: Insufficient documentation

## 2013-10-13 DIAGNOSIS — Z9114 Patient's other noncompliance with medication regimen: Secondary | ICD-10-CM

## 2013-10-13 DIAGNOSIS — F17200 Nicotine dependence, unspecified, uncomplicated: Secondary | ICD-10-CM | POA: Insufficient documentation

## 2013-10-13 DIAGNOSIS — Z91199 Patient's noncompliance with other medical treatment and regimen due to unspecified reason: Secondary | ICD-10-CM | POA: Insufficient documentation

## 2013-10-13 DIAGNOSIS — I5032 Chronic diastolic (congestive) heart failure: Principal | ICD-10-CM | POA: Insufficient documentation

## 2013-10-13 DIAGNOSIS — I2510 Atherosclerotic heart disease of native coronary artery without angina pectoris: Secondary | ICD-10-CM

## 2013-10-13 DIAGNOSIS — R0602 Shortness of breath: Secondary | ICD-10-CM | POA: Insufficient documentation

## 2013-10-13 DIAGNOSIS — I10 Essential (primary) hypertension: Secondary | ICD-10-CM

## 2013-10-13 DIAGNOSIS — N186 End stage renal disease: Secondary | ICD-10-CM | POA: Diagnosis present

## 2013-10-13 DIAGNOSIS — Z9119 Patient's noncompliance with other medical treatment and regimen: Secondary | ICD-10-CM | POA: Insufficient documentation

## 2013-10-13 DIAGNOSIS — R079 Chest pain, unspecified: Secondary | ICD-10-CM | POA: Insufficient documentation

## 2013-10-13 DIAGNOSIS — E7800 Pure hypercholesterolemia, unspecified: Secondary | ICD-10-CM | POA: Insufficient documentation

## 2013-10-13 DIAGNOSIS — I509 Heart failure, unspecified: Secondary | ICD-10-CM

## 2013-10-13 DIAGNOSIS — I5033 Acute on chronic diastolic (congestive) heart failure: Secondary | ICD-10-CM | POA: Diagnosis present

## 2013-10-13 DIAGNOSIS — N189 Chronic kidney disease, unspecified: Secondary | ICD-10-CM | POA: Insufficient documentation

## 2013-10-13 LAB — COMPREHENSIVE METABOLIC PANEL
ALT: 257 U/L — ABNORMAL HIGH (ref 0–35)
Albumin: 3.1 g/dL — ABNORMAL LOW (ref 3.5–5.2)
BUN: 41 mg/dL — ABNORMAL HIGH (ref 6–23)
CO2: 13 mEq/L — ABNORMAL LOW (ref 19–32)
Chloride: 102 mEq/L (ref 96–112)
Creatinine, Ser: 4.25 mg/dL — ABNORMAL HIGH (ref 0.50–1.10)
GFR calc Af Amer: 13 mL/min — ABNORMAL LOW (ref >90)
GFR calc non Af Amer: 11 mL/min — ABNORMAL LOW (ref >90)
Total Bilirubin: 0.9 mg/dL (ref 0.3–1.2)

## 2013-10-13 LAB — POCT I-STAT TROPONIN I: Troponin i, poc: 0.17 ng/mL (ref 0.00–0.08)

## 2013-10-13 LAB — CBC
MCH: 29.7 pg (ref 26.0–34.0)
MCV: 85.7 fL (ref 78.0–100.0)
Platelets: 208 10*3/uL (ref 150–400)
RBC: 4.48 MIL/uL (ref 3.87–5.11)

## 2013-10-13 LAB — URINALYSIS W MICROSCOPIC + REFLEX CULTURE
Ketones, ur: NEGATIVE mg/dL
Leukocytes, UA: NEGATIVE
Nitrite: NEGATIVE
Protein, ur: >300 mg/dL — AB
Urobilinogen, UA: 1 mg/dL (ref 0.0–1.0)

## 2013-10-13 LAB — RAPID URINE DRUG SCREEN, HOSP PERFORMED
Amphetamines: NOT DETECTED
Barbiturates: NOT DETECTED
Benzodiazepines: NOT DETECTED
Cocaine: NOT DETECTED
Tetrahydrocannabinol: NOT DETECTED

## 2013-10-13 LAB — TROPONIN I: Troponin I: <0.3 ng/mL (ref <0.30)

## 2013-10-13 LAB — PRO B NATRIURETIC PEPTIDE: Pro B Natriuretic peptide (BNP): >70000 pg/mL — ABNORMAL HIGH (ref 0.0–100.0)

## 2013-10-13 MED ORDER — AMLODIPINE BESYLATE 10 MG PO TABS
10.0000 mg | ORAL_TABLET | Freq: Once | ORAL | Status: AC
  Administered 2013-10-13: 10 mg via ORAL
  Filled 2013-10-13: qty 1

## 2013-10-13 MED ORDER — LABETALOL HCL 200 MG PO TABS
200.0000 mg | ORAL_TABLET | Freq: Once | ORAL | Status: AC
  Administered 2013-10-13: 200 mg via ORAL
  Filled 2013-10-13: qty 1

## 2013-10-13 MED ORDER — HYDRALAZINE HCL 50 MG PO TABS
100.0000 mg | ORAL_TABLET | Freq: Once | ORAL | Status: AC
  Administered 2013-10-13: 100 mg via ORAL
  Filled 2013-10-13: qty 2

## 2013-10-13 MED ORDER — ISOSORBIDE MONONITRATE ER 30 MG PO TB24
30.0000 mg | ORAL_TABLET | Freq: Once | ORAL | Status: AC
  Administered 2013-10-13: 30 mg via ORAL
  Filled 2013-10-13: qty 1

## 2013-10-13 MED ORDER — ONDANSETRON HCL 4 MG/2ML IJ SOLN
4.0000 mg | Freq: Once | INTRAMUSCULAR | Status: AC
  Administered 2013-10-13: 4 mg via INTRAVENOUS
  Filled 2013-10-13: qty 2

## 2013-10-13 MED ORDER — FUROSEMIDE 80 MG PO TABS
80.0000 mg | ORAL_TABLET | Freq: Two times a day (BID) | ORAL | Status: DC
  Administered 2013-10-13 – 2013-10-14 (×3): 80 mg via ORAL
  Filled 2013-10-13: qty 1
  Filled 2013-10-13: qty 4
  Filled 2013-10-13 (×2): qty 1

## 2013-10-13 NOTE — ED Notes (Signed)
Per EMS pt from home, called out for Shortness of breath. Pt has hx renal failure- not on diaylsis. Pt tachypnic, CP with deep inspiration and slight headache. Pleural rub ausculatated bilaterally.  Pt has had productive cough- foamy, yellow sputum. Pt alert & oriented- poor historian.

## 2013-10-13 NOTE — ED Provider Notes (Signed)
Medical screening examination/treatment/procedure(s) were performed by non-physician practitioner and as supervising physician I was immediately available for consultation/collaboration.  EKG Normal sinus rhythm rate 88 Left atrial enlargement Left ventricular hypertrophy Not specific T wave abnormality noted on prior EKGs not present on today's EKG  Kathalene Frames, MD 10/17/13 1501

## 2013-10-13 NOTE — ED Notes (Signed)
Pt denies taking BP medication today

## 2013-10-13 NOTE — ED Provider Notes (Signed)
CSN: [id number]     Arrival date & time 10/13/13  2122 History   First MD Initiated Contact with Patient 10/13/13 2156     Chief Complaint  Patient presents with  . Shortness of Breath   (Consider location/radiation/quality/duration/timing/severity/associated sxs/prior Treatment) HPI [name] is a 52 y.o. female who presents to emergency department complaining of nausea, chest pain, shortness of breath, abdominal pain. Patient states that her symptoms began this morning. She states they worsen just prior to coming in. She states that she checked her blood pressure home and he was high. She states she did not take her medications today because "I was asleep all day and my daughter is yelling at me." Pt states she did take her medications yesterday, states that she sometimes misses doses. Denies headache, visual changes, neck pain, back pain. No numbness or weakness in extremities.     Past Medical History  Diagnosis Date  . HTN (hypertension)     resistant  . Hypercholesterolemia   . Tobacco abuse     resumed smoking half a pack a day. She was a smoker in the past and states she was abl eto stop in the past using nicotine patches  . CAD (coronary artery disease)     Pt has St elevation MI in Feb 2009. Left hear tcath at tha ttime showed a 70% distal LAD lesion that was hazy consistent with a plaque rupture. She had a 50% distal circumflex stenosis an a 95% distal RCA stenosis. She did havve drug eluting stent placed in her distal RCA  . Chronic kidney disease     most recent creatinine 1.4 in 3/10  . Intracranial hemorrhage, spontaneous intraparenchymal, idiopathic, remote, resolved   . Diastolic heart failure     pt most recent echo was in Feb 2009 w an EF of 60% and severe left ventricular hypertrophy. The pt did have evidence of diastolic dysfunction. The RV appeared normal  . Subarachnoid hemorrhage     03/2012  . Aneurysm   . CHF (congestive heart failure)     diastolic    Past Surgical History  Procedure Laterality Date  . Ventriculoperitoneal shunt  03/27/2012    Procedure: SHUNT INSERTION VENTRICULAR-PERITONEAL;  Surgeon: Winfield Cunas, MD;  Location: [facility];  Service: Neurosurgery;  Laterality: N/A;  Ventricular-Peritoneal Shunt Insertion  . Cardiac catheterization    . Coronary angioplasty    . Abdominal hysterectomy    . Bascilic vein transposition Right 05/13/2013    Procedure: [place];  Surgeon: Rosetta Posner, MD;  Location: [facility] OR;  Service: Vascular;  Laterality: Right;   Family History  Problem Relation Age of Onset  . Coronary artery disease Other     premature in 1st degree relatives   History  Substance Use Topics  . Smoking status: Current Every Day Smoker -- 0.20 packs/day for 35 years    Types: Cigarettes  . Smokeless tobacco: Never Used     Comment: 1/2 ppd   . Alcohol Use: No   OB History   Grav Para Term Preterm Abortions TAB SAB Ect Mult Living                 Review of Systems  Constitutional: Negative for fever and chills.  Eyes: Negative for pain and visual disturbance.  Respiratory: Positive for chest tightness and shortness of breath. Negative for cough.   Cardiovascular: Positive for chest pain. Negative for palpitations and leg swelling.  Gastrointestinal: Positive for nausea, vomiting  and abdominal pain. Negative for diarrhea.  Genitourinary: Negative for dysuria, flank pain, vaginal bleeding, vaginal discharge, vaginal pain and pelvic pain.  Musculoskeletal: Negative for arthralgias, myalgias, neck pain and neck stiffness.  Skin: Negative for rash.  Neurological: Negative for dizziness, weakness, light-headedness and headaches.  All other systems reviewed and are negative.    Allergies  Ampicillin and Penicillins  Home Medications   Current Outpatient Rx  Name  Route  Sig  Dispense  Refill  . acetaminophen (TYLENOL) 325 MG tablet   Oral   Take 650 mg by mouth every 6 (six) hours  as needed for pain (headache).         Marland Kitchen amLODipine (NORVASC) 10 MG tablet   Oral   Take 1 tablet (10 mg total) by mouth daily.   30 tablet   0   . calcitRIOL (ROCALTROL) 0.25 MCG capsule   Oral   Take 1 capsule (0.25 mcg total) by mouth every Monday, Wednesday, and Friday.   30 capsule   0   . camphor-menthol (SARNA) lotion   Topical   Apply topically as needed for itching.   222 mL   0   . furosemide (LASIX) 80 MG tablet   Oral   Take 1 tablet (80 mg total) by mouth 2 (two) times daily.   60 tablet   0   . hydrALAZINE (APRESOLINE) 100 MG tablet   Oral   Take 1 tablet (100 mg total) by mouth 3 (three) times daily.   90 tablet   0   . isosorbide mononitrate (IMDUR) 30 MG 24 hr tablet   Oral   Take 30 mg by mouth daily.         Marland Kitchen labetalol (NORMODYNE) 200 MG tablet   Oral   Take 1 tablet (200 mg total) by mouth 3 (three) times daily.   90 tablet   0   . nitroGLYCERIN (NITROSTAT) 0.4 MG SL tablet   Sublingual   Place 1 tablet (0.4 mg total) under the tongue every 5 (five) minutes as needed for chest pain.   20 tablet   0    BP 241/117  Pulse 89  Temp(Src) 97.5 F (36.4 C) (Oral)  Resp 21  SpO2 100% Physical Exam  Nursing note and vitals reviewed. Constitutional: She is oriented to person, place, and time. She appears well-developed and well-nourished. No distress.  HENT:  Head: Normocephalic.  Eyes: Conjunctivae and EOM are normal. Pupils are equal, round, and reactive to light.  Neck: Normal range of motion. Neck supple.  Cardiovascular: Normal rate, regular rhythm and normal heart sounds.   Pulmonary/Chest: Effort normal. No respiratory distress. She has no wheezes. She has rales.  Rales at bases  Abdominal: Soft. Bowel sounds are normal. She exhibits no distension. There is tenderness. There is no rebound.  Diffuse tenderness  Musculoskeletal: She exhibits no edema.  Neurological: She is alert and oriented to person, place, and time. No cranial  nerve deficit. Coordination normal.  Skin: Skin is warm and dry.  Psychiatric: She has a normal mood and affect. Her behavior is normal.    ED Course  Procedures (including critical care time) Labs Review Labs Reviewed  PRO B NATRIURETIC PEPTIDE - Abnormal; Notable for the following:    Pro B Natriuretic peptide (BNP) >70000.0 (*)    All other components within normal limits  COMPREHENSIVE METABOLIC PANEL - Abnormal; Notable for the following:    CO2 13 (*)    BUN 41 (*)  Creatinine, Ser 4.25 (*)    Albumin 3.1 (*)    AST 164 (*)    ALT 257 (*)    GFR calc non Af Amer 11 (*)    GFR calc Af Amer 13 (*)    All other components within normal limits  URINALYSIS W MICROSCOPIC + REFLEX CULTURE - Abnormal; Notable for the following:    APPearance CLOUDY (*)    Hgb urine dipstick LARGE (*)    Protein, ur >300 (*)    Bacteria, UA FEW (*)    Squamous Epithelial / LPF FEW (*)    All other components within normal limits  POCT I-STAT TROPONIN I - Abnormal; Notable for the following:    Troponin i, poc 0.17 (*)    All other components within normal limits  CBC  TROPONIN I  URINE RAPID DRUG SCREEN (HOSP PERFORMED)   Imaging Review Dg Chest Portable 1 View  10/13/2013   CLINICAL DATA:  Short of breath.  EXAM: PORTABLE CHEST - 1 VIEW  COMPARISON:  09/25/2013.  FINDINGS: Cardiomegaly. VP shunt tubing projects over the right chest. Interstitial pulmonary edema is present. Pulmonary vascular congestion. Monitoring leads project over the chest. No focal consolidation. No definite effusion on this single frontal view.  IMPRESSION: Mild CHF.   Electronically Signed   By: Dereck Ligas M.D.   On: 10/13/2013 22:44    EKG Interpretation   None       MDM   1. Hypertensive urgency   2. Chest pain   3. CHF (congestive heart failure)   4. CAD (coronary artery disease)   5. H/O noncompliance with medical treatment, presenting hazards to health     Patient in emergency department with  multiple complaints including chest pain, shortness of breath, abdominal pain. She is hypertensive. Her initial blood pressure is 241/117. She admits to not taking any of her blood pressure medicines today. Given her symptoms and elevated blood pressure suspect hypertensive urgency. I will order her her regular blood pressure medications right now. I have also ordered her blood work, chest x-ray. Currently she denies any headache or neurological symptom. We'll continue to monitor.   I spoke with [facility] they will admit patient for observation and further treatment of her hypertension. Her blood pressures coming down after receiving her regular blood pressure medications. Her labs are all at her baseline. Her troponin blood of care was elevated however regular lab one is normal. Suspect this could be as a result of her kidney disease. Her chest x-ray showing mild CHF. Her vital signs are normal other than hypertension.  Filed Vitals:   10/13/13 2132 10/13/13 2138 10/13/13 2353 10/14/13 0030  BP:  241/117 195/97 151/59  Pulse:  89 74 71  Temp:  97.5 F (36.4 C)    TempSrc:  Oral    Resp:  21 18   SpO2: 100% 100% 100% 99%        Renold Genta, PA-C 10/14/13 0039

## 2013-10-14 DIAGNOSIS — Z9119 Patient's noncompliance with other medical treatment and regimen: Secondary | ICD-10-CM

## 2013-10-14 DIAGNOSIS — R079 Chest pain, unspecified: Secondary | ICD-10-CM

## 2013-10-14 DIAGNOSIS — I2510 Atherosclerotic heart disease of native coronary artery without angina pectoris: Secondary | ICD-10-CM

## 2013-10-14 DIAGNOSIS — I10 Essential (primary) hypertension: Secondary | ICD-10-CM

## 2013-10-14 DIAGNOSIS — I5032 Chronic diastolic (congestive) heart failure: Secondary | ICD-10-CM

## 2013-10-14 DIAGNOSIS — N184 Chronic kidney disease, stage 4 (severe): Secondary | ICD-10-CM

## 2013-10-14 MED ORDER — SODIUM BICARBONATE 650 MG PO TABS: 650.0000 mg | ORAL_TABLET | Freq: Three times a day (TID) | ORAL | Status: DC

## 2013-10-14 MED ORDER — ONDANSETRON HCL 4 MG/2ML IJ SOLN: 4.0000 mg | Freq: Four times a day (QID) | INTRAMUSCULAR | Status: DC | PRN

## 2013-10-14 MED ORDER — SODIUM CHLORIDE 0.9 % IJ SOLN
3.0000 mL | Freq: Two times a day (BID) | INTRAMUSCULAR | Status: DC
  Administered 2013-10-14: 3 mL via INTRAVENOUS

## 2013-10-14 MED ORDER — SODIUM CHLORIDE 0.9 % IV SOLN: 250.0000 mL | INTRAVENOUS | Status: DC | PRN

## 2013-10-14 MED ORDER — SODIUM CHLORIDE 0.9 % IJ SOLN: 3.0000 mL | INTRAMUSCULAR | Status: DC | PRN

## 2013-10-14 MED ORDER — ONDANSETRON HCL 4 MG PO TABS: 4.0000 mg | ORAL_TABLET | Freq: Four times a day (QID) | ORAL | Status: DC | PRN

## 2013-10-14 MED ORDER — DOCUSATE SODIUM 100 MG PO CAPS
100.0000 mg | ORAL_CAPSULE | Freq: Two times a day (BID) | ORAL | Status: DC
  Filled 2013-10-14 (×2): qty 1

## 2013-10-14 MED ORDER — DIPHENHYDRAMINE HCL 25 MG PO CAPS
25.0000 mg | ORAL_CAPSULE | Freq: Four times a day (QID) | ORAL | Status: DC | PRN
  Administered 2013-10-14 (×2): 25 mg via ORAL
  Filled 2013-10-14 (×3): qty 1

## 2013-10-14 MED ORDER — HYDRALAZINE HCL 20 MG/ML IJ SOLN: 10.0000 mg | INTRAMUSCULAR | Status: DC | PRN

## 2013-10-14 MED ORDER — CALCITRIOL 0.25 MCG PO CAPS
0.2500 ug | ORAL_CAPSULE | ORAL | Status: DC
  Administered 2013-10-14: 0.25 ug via ORAL
  Filled 2013-10-14: qty 1

## 2013-10-14 NOTE — Discharge Summary (Signed)
Physician Discharge Summary  [name] [id number] DOB: 08/20/1961 DOA: 10/13/2013  PCP: No PCP Per Patient  Admit date: 10/13/2013 Discharge date: 10/14/2013  Time spent: >30  minutes  Recommendations for Outpatient Follow-up:  1. Reassess BP and adjust medications as needed 2. Assess electrolytes and determine best timing for HD 3. Assist with medication compliance  Discharge Diagnoses:  Active Problems:   DIASTOLIC HEART FAILURE, CHRONIC   H/O noncompliance with medical treatment, presenting hazards to health   Tobacco abuse   HTN (hypertension), malignant   CAD (coronary artery disease)   CKD (chronic kidney disease), stage IV   Accelerated hypertension   Discharge Condition: stable and improved. Patient advised to take medications, stop smoking, follow a low sodium diet and follow with [facility] appointment as planned for 10/18/2013.  Diet recommendation: low sodium heart healthy diet  Filed Weights   10/14/13 0114  Weight: 63.73 kg (140 lb 8 oz)    History of present illness:  52 y.o. female with hx of severe HTN, recent [facility] the [facility] for same, hx of hypercholesterolemia, tobacco abuse, noncompliance to medical treatment, CAD, diastolic CHF, Intracranial hemorrhage, presents to the ER as she was feeling shortness of breath. In the ER, she was found to have SBP of 241 over DBP of 114. Her CXR showed mild CHF, her BUN 41, and Cr of 4.4 which is not too far from her baseline. She admitted to omitting her HTN meds this morning, citing that her daughter didn't wake her up to take them. She denied HA or chest pain. Her meds were resumed in the ER, and hospitalist was asked to admit her for malignant HTN due to medical noncompliance.   Hospital Course:  1-Accelerated HTN: secondary to medication non compliance -patient responded to use of home BP meds inside the hospital -at discharge no CP or SOB -advise to be compliant with medications and to follow low  sodium diet  2-CKD stage 4-5: patient will follow with [facility] to establish care and to determined best time to start HD -no uremia, no hyperkalemia and SCr stable.  3-tobacco abuse: patient advised to stop smoking.  4-chronic diastolic heart failure: patient in early stages of exacerbation due to HTN. -once BP controlled and lasix resumed, no SOB or signs of overfluid -elevated BNP driven in part by renal failure  *Rest of medical problems remains stable and the plan is to continue current medical therapy  Procedures:  See below for x-ray reports  Consultations:  None   Discharge Exam: Filed Vitals:   10/14/13 1444  BP: 181/91  Pulse: 66  Temp: 97.3 F (36.3 C)  Resp: 18    General: patient denies CP, SOB, HA's or any other complaints Cardiovascular: S1 and S2, no rubs or gallops Respiratory: CTA bilaterally Abdomen:soft, NT,ND and with positive BS  Discharge Instructions  Discharge Orders   Future Orders Complete By Expires   Diet - low sodium heart healthy  As directed    Discharge instructions  As directed    Comments:     Follow low sodium diet Take medications as prescribed Please follow with [facility] as instructed on 10/18/13       Medication List    STOP taking these medications       acetaminophen 325 MG tablet  Commonly known as:  TYLENOL      TAKE these medications       amLODipine 10 MG tablet  Commonly known as:  NORVASC  Take 1 tablet (10 mg total) by mouth daily.     calcitRIOL 0.25 MCG capsule  Commonly known as:  ROCALTROL  Take 1 capsule (0.25 mcg total) by mouth every Monday, Wednesday, and Friday.     camphor-menthol lotion  Commonly known as:  SARNA  Apply topically as needed for itching.     furosemide 80 MG tablet  Commonly known as:  LASIX  Take 1 tablet (80 mg total) by mouth 2 (two) times daily.     hydrALAZINE 100 MG tablet  Commonly known as:  APRESOLINE  Take 1 tablet (100 mg  total) by mouth 3 (three) times daily.     isosorbide mononitrate 30 MG 24 hr tablet  Commonly known as:  IMDUR  Take 30 mg by mouth daily.     labetalol 200 MG tablet  Commonly known as:  NORMODYNE  Take 1 tablet (200 mg total) by mouth 3 (three) times daily.     nitroGLYCERIN 0.4 MG SL tablet  Commonly known as:  NITROSTAT  Place 1 tablet (0.4 mg total) under the tongue every 5 (five) minutes as needed for chest pain.     sodium bicarbonate 650 MG tablet  Take 1 tablet (650 mg total) by mouth 3 (three) times daily.       Allergies  Allergen Reactions  . Ampicillin Hives and Rash  . Penicillins Hives and Rash     The results of significant diagnostics from this hospitalization (including imaging, microbiology, ancillary and laboratory) are listed below for reference.    Significant Diagnostic Studies: Ct Head Wo Contrast  09/25/2013   CLINICAL DATA:  Nausea and vomiting. Right eye pain and visual disturbance. Ventriculoperitoneal shunt.  EXAM: CT HEAD WITHOUT CONTRAST  TECHNIQUE: Contiguous axial images were obtained from the base of the skull through the vertex without intravenous contrast.  COMPARISON:  03/24/2013.  FINDINGS: The right frontal ventriculoperitoneal shunt is unchanged. Normal sized ventricles with an interval decrease in size. Stable streak artifacts from aneurysm clips and coils at the skull base. Stable post craniotomy changes on the right. No intracranial hemorrhage, mass lesion or CT evidence of acute infarction. 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 Stable mild patchy white matter low density in both cerebral hemispheres.  IMPRESSION: 1. Stable right ventriculoperitoneal shunt tube with an interval decrease in size of the ventricles. 2. 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 3. Stable mild chronic small vessel white matter ischemic changes in both  cerebral hemispheres. 4. No acute abnormality.   Electronically Signed   By: Enrique Sack M.D.   On: 09/25/2013 17:06   US Abdomen Complete  09/25/2013   CLINICAL DATA:  Abdominal pain, nausea/vomiting  EXAM: ULTRASOUND ABDOMEN COMPLETE  COMPARISON:  None.  FINDINGS: Gallbladder:  Underdistended. Mild gallbladder wall thickening, measuring 7-8 mm. No gallstones or pericholecystic fluid. Negative sonographic Murphy's sign.  Common bile duct:  Diameter: 3 mm.  Liver:  No focal lesion identified. Within normal limits in parenchymal echogenicity.  IVC:  No abnormality visualized.  Pancreas:  Visualized portions are unremarkable.  Spleen:  Measures 3.8 cm  Right Kidney:  Length: 10.2 cm. Increased parenchymal echogenicity, suggesting medical renal disease. No hydronephrosis.  Left Kidney:  Length: 9.7 cm. Increased parenchymal echogenicity, suggesting medical renal disease. Two lower pole cysts measuring up to 1.2 x 1.6 x 1.4 cm. No hydronephrosis.  Abdominal aorta:  No aneurysm  visualized.  Other findings:  Perihepatic ascites.  Bilateral pleural effusions.  IMPRESSION: Mild gallbladder wall thickening, nonspecific. No associated sonographic findings to suggest acute cholecystitis.  Perihepatic ascites.  Bilateral pleural effusions.   Electronically Signed   By: Julian Hy M.D.   On: 09/25/2013 14:40   Dg Chest Portable 1 View  10/13/2013   CLINICAL DATA:  Short of breath.  EXAM: PORTABLE CHEST - 1 VIEW  COMPARISON:  09/25/2013.  FINDINGS: Cardiomegaly. VP shunt tubing projects over the right chest. Interstitial pulmonary edema is present. Pulmonary vascular congestion. Monitoring leads project over the chest. No focal consolidation. No definite effusion on this single frontal view.  IMPRESSION: Mild CHF.   Electronically Signed   By: Dereck Ligas M.D.   On: 10/13/2013 22:44   Dg Chest Port 1 View  09/25/2013   CLINICAL DATA:  Chest pain, shortness of Breath  EXAM: PORTABLE CHEST - 1 VIEW  COMPARISON:   05/07/2013  FINDINGS: Cardiomegaly again noted. Right VP shunt catheter. No acute infiltrate or pleural effusion. No pulmonary edema. Mild left basilar atelectasis.  IMPRESSION: No acute infiltrate or pulmonary edema. Mild left basilar atelectasis.   Electronically Signed   By: Lahoma Crocker M.D.   On: 09/25/2013 11:22    Labs: Basic Metabolic Panel:  Recent Labs Lab 10/13/13 2210  NA 135  K 3.9  CL 102  CO2 13*  GLUCOSE 85  BUN 41*  CREATININE 4.25*  CALCIUM 8.9   Liver Function Tests:  Recent Labs Lab 10/13/13 2210  AST 164*  ALT 257*  ALKPHOS 101  BILITOT 0.9  PROT 6.5  ALBUMIN 3.1*   CBC:  Recent Labs Lab 10/13/13 2210  WBC 4.8  HGB 13.3  HCT 38.4  MCV 85.7  PLT 208   Cardiac Enzymes:  Recent Labs Lab 10/13/13 2210  TROPONINI <0.30   BNP: BNP (last 3 results)  Recent Labs  05/06/13 1410 05/07/13 0500 10/13/13 2210  PROBNP 26098.0* 20527.0* >70000.0*    Signed:  Jance Siek  [facility] 10/14/2013, 4:00 PM

## 2013-10-14 NOTE — Progress Notes (Signed)
Report given to [place], Therapist, sports. Care released.Cindee Salt

## 2013-10-14 NOTE — H&P (Signed)
[facility] History and Physical  [name] [id number] DOB: 02/26/1961    PCP:   None.  Chief Complaint: shortness of breath.  HPI: [name] is an 52 y.o. female with hx of severe HTN, recent admission to the teaching service for same, hx of hypercholesterolemia, tobacco abuse, noncompliance to medical treatment, CAD, diastolic CHF, Intracranial hemorrhage, presents to the ER as she was feeling shortness of breath.  In the ER, she was found to have SBP of 241 over DBP of 114.  Her CXR showed mild CHF, her BUN 41, and Cr of 4.4 which is not too far from her baseline.  She admitted to omitting her HTN meds this morning, citing that her daughter didn't wake her up to take them.  She denied HA or chest pain.  Her meds were resumed in the ER, and hospitalist was asked to admit her for malignant HTN due to medical noncompliance.  Rewiew of Systems:  Constitutional: Negative for malaise, fever and chills. No significant weight loss or weight gain Eyes: Negative for eye pain, redness and discharge, diplopia, visual changes, or flashes of light. ENMT: Negative for ear pain, hoarseness, nasal congestion, sinus pressure and sore throat. No headaches; tinnitus, drooling, or problem swallowing. Cardiovascular: Negative for chest pain, palpitations, diaphoresis,  and peripheral edema. ; No orthopnea, PND Respiratory: Negative for cough, hemoptysis, wheezing and stridor. No pleuritic chestpain. Gastrointestinal: Negative for nausea, vomiting, diarrhea, constipation, abdominal pain, melena, blood in stool, hematemesis, jaundice and rectal bleeding.    Genitourinary: Negative for frequency, dysuria, incontinence,flank pain and hematuria; Musculoskeletal: Negative for back pain and neck pain. Negative for swelling and trauma.;  Skin: . Negative for pruritus, rash, abrasions, bruising and skin lesion.; ulcerations Neuro: Negative for headache, lightheadedness and neck stiffness. Negative for  weakness, altered level of consciousness , altered mental status, extremity weakness, burning feet, involuntary movement, seizure and syncope.  Psych: negative for anxiety, depression, insomnia, tearfulness, panic attacks, hallucinations, paranoia, suicidal or homicidal ideation    Past Medical History  Diagnosis Date  . HTN (hypertension)     resistant  . Hypercholesterolemia   . Tobacco abuse     resumed smoking half a pack a day. She was a smoker in the past and states she was abl eto stop in the past using nicotine patches  . CAD (coronary artery disease)     Pt has St elevation MI in Feb 2009. Left hear tcath at tha ttime showed a 70% distal LAD lesion that was hazy consistent with a plaque rupture. She had a 50% distal circumflex stenosis an a 95% distal RCA stenosis. She did havve drug eluting stent placed in her distal RCA  . Chronic kidney disease     most recent creatinine 1.4 in 3/10  . Intracranial hemorrhage, spontaneous intraparenchymal, idiopathic, remote, resolved   . Diastolic heart failure     pt most recent echo was in Feb 2009 w an EF of 60% and severe left ventricular hypertrophy. The pt did have evidence of diastolic dysfunction. The RV appeared normal  . Subarachnoid hemorrhage     03/2012  . Aneurysm   . CHF (congestive heart failure)     diastolic    Past Surgical History  Procedure Laterality Date  . Ventriculoperitoneal shunt  03/27/2012    Procedure: SHUNT INSERTION VENTRICULAR-PERITONEAL;  Surgeon: Winfield Cunas, MD;  Location: [place] [facility];  Service: Neurosurgery;  Laterality: N/A;  Ventricular-Peritoneal Shunt Insertion  . Cardiac catheterization    . Coronary angioplasty    .  Abdominal hysterectomy    . Bascilic vein transposition Right 05/13/2013    Procedure: [place];  Surgeon: Rosetta Posner, MD;  Location: [facility];  Service: Vascular;  Laterality: Right;    Medications:  HOME MEDS: Prior to Admission medications   Medication Sig  Start Date End Date Taking? Authorizing Provider  acetaminophen (TYLENOL) 325 MG tablet Take 650 mg by mouth every 6 (six) hours as needed for pain (headache).   Yes Historical Provider, MD  amLODipine (NORVASC) 10 MG tablet Take 1 tablet (10 mg total) by mouth daily. 05/18/13   Janece Canterbury, MD  calcitRIOL (ROCALTROL) 0.25 MCG capsule Take 1 capsule (0.25 mcg total) by mouth every Monday, Wednesday, and Friday. 05/18/13   Janece Canterbury, MD  camphor-menthol [facility]) lotion Apply topically as needed for itching. 05/18/13   Janece Canterbury, MD  furosemide (LASIX) 80 MG tablet Take 1 tablet (80 mg total) by mouth 2 (two) times daily. 05/18/13   Janece Canterbury, MD  hydrALAZINE (APRESOLINE) 100 MG tablet Take 1 tablet (100 mg total) by mouth 3 (three) times daily. 05/18/13   Janece Canterbury, MD  isosorbide mononitrate (IMDUR) 30 MG 24 hr tablet Take 30 mg by mouth daily.    Historical Provider, MD  labetalol (NORMODYNE) 200 MG tablet Take 1 tablet (200 mg total) by mouth 3 (three) times daily. 05/18/13   Janece Canterbury, MD  nitroGLYCERIN (NITROSTAT) 0.4 MG SL tablet Place 1 tablet (0.4 mg total) under the tongue every 5 (five) minutes as needed for chest pain. 05/18/13   Janece Canterbury, MD     Allergies:  Allergies  Allergen Reactions  . Ampicillin Hives and Rash  . Penicillins Hives and Rash    Social History:   reports that she has been smoking Cigarettes.  She has a 7 pack-year smoking history. She has never used smokeless tobacco. She reports that she does not drink alcohol or use illicit drugs.  Family History: Family History  Problem Relation Age of Onset  . Coronary artery disease Other     premature in 1st degree relatives     Physical Exam: Filed Vitals:   10/13/13 2132 10/13/13 2138 10/13/13 2353  BP:  241/117 195/97  Pulse:  89 74  Temp:  97.5 F (36.4 C)   TempSrc:  Oral   Resp:  21 18  SpO2: 100% 100% 100%   Blood pressure 195/97, pulse 74, temperature 97.5 F (36.4  C), temperature source Oral, resp. rate 18, SpO2 100.00%.  GEN:  Pleasant patient lying in the stretcher in no acute distress; cooperative with exam. PSYCH:  alert and oriented x4; does not appear anxious or depressed; affect is appropriate. HEENT: Mucous membranes pink and anicteric; PERRLA; EOM intact; no cervical lymphadenopathy nor thyromegaly or carotid bruit; no JVD; There were no stridor. Neck is very supple. Breasts:: Not examined CHEST WALL: No tenderness CHEST: Normal respiration, clear to auscultation bilaterally.  HEART: Regular rate and rhythm.  There are no murmur, rub, or gallops.   BACK: No kyphosis or scoliosis; no CVA tenderness ABDOMEN: soft and non-tender; no masses, no organomegaly, normal abdominal bowel sounds; no pannus; no intertriginous candida. There is no rebound and no distention. Rectal Exam: Not done EXTREMITIES: No bone or joint deformity; age-appropriate arthropathy of the hands and knees; 1+ edema; no ulcerations.  There is no calf tenderness. Genitalia: not examined PULSES: 2+ and symmetric SKIN: Normal hydration no rash or ulceration CNS: Cranial nerves 2-12 grossly intact no focal lateralizing neurologic deficit.  Speech is fluent; uvula elevated with phonation, facial symmetry and tongue midline. DTR are normal bilaterally, cerebella exam is intact, barbinski is negative and strengths are equaled bilaterally.  No sensory loss.   Labs on Admission:  Basic Metabolic Panel:  Recent Labs Lab 10/13/13 2210  NA 135  K 3.9  CL 102  CO2 13*  GLUCOSE 85  BUN 41*  CREATININE 4.25*  CALCIUM 8.9   Liver Function Tests:  Recent Labs Lab 10/13/13 2210  AST 164*  ALT 257*  ALKPHOS 101  BILITOT 0.9  PROT 6.5  ALBUMIN 3.1*   No results found for this basename: LIPASE, AMYLASE,  in the last 168 hours No results found for this basename: AMMONIA,  in the last 168 hours CBC:  Recent Labs Lab 10/13/13 2210  WBC 4.8  HGB 13.3  HCT 38.4  MCV 85.7   PLT 208   Cardiac Enzymes:  Recent Labs Lab 10/13/13 2210  TROPONINI <0.30    CBG: No results found for this basename: GLUCAP,  in the last 168 hours   Radiological Exams on Admission: Dg Chest Portable 1 View  10/13/2013   CLINICAL DATA:  Short of breath.  EXAM: PORTABLE CHEST - 1 VIEW  COMPARISON:  09/25/2013.  FINDINGS: Cardiomegaly. VP shunt tubing projects over the right chest. Interstitial pulmonary edema is present. Pulmonary vascular congestion. Monitoring leads project over the chest. No focal consolidation. No definite effusion on this single frontal view.  IMPRESSION: Mild CHF.   Electronically Signed   By: Dereck Ligas M.D.   On: 10/13/2013 22:44   Assessment/Plan Present on Admission:  . HTN (hypertension), malignant . CKD (chronic kidney disease), stage IV . DIASTOLIC HEART FAILURE, CHRONIC . CAD (coronary artery disease) . Accelerated hypertension . Tobacco abuse  PLAN:  Her shortness of breath is most likely from HTN CMP as her BP is currently quite high.  She is noncompliant with her meds.  Admit her to OBS, resume her medications, and use IV Hydralazine PRN for severe high BP.  I have resumed her lasix as well.  She was counseled about the detrimental effect of not taking her BP medications.  Her CKD is stable, and I suspect she will feel better once her BP is under better controlled.  Advise against tobacco use as well.  She is stable, full code, and will be admitted to [facility] service.  Thank you for asking me to participate in her care.  Other plans as per orders.  Code Status: FULL Haskel Khan, MD. [facility] Pager [phone] 7pm to 7am.  10/14/2013, 12:06 AM

## 2013-10-18 ENCOUNTER — Encounter: Payer: Self-pay | Admitting: Physician Assistant

## 2013-10-20 ENCOUNTER — Encounter (HOSPITAL_COMMUNITY): Payer: Self-pay | Admitting: Emergency Medicine

## 2013-10-20 ENCOUNTER — Inpatient Hospital Stay (HOSPITAL_COMMUNITY): Payer: Medicare Other

## 2013-10-20 ENCOUNTER — Inpatient Hospital Stay (HOSPITAL_COMMUNITY)
Admission: EM | Admit: 2013-10-20 | Discharge: 2013-10-23 | DRG: 682 | Disposition: A | Discharge status: Home / Self Care | Payer: Medicare Other | Attending: Internal Medicine | Admitting: Internal Medicine

## 2013-10-20 DIAGNOSIS — I509 Heart failure, unspecified: Secondary | ICD-10-CM

## 2013-10-20 DIAGNOSIS — Z91199 Patient's noncompliance with other medical treatment and regimen due to unspecified reason: Secondary | ICD-10-CM

## 2013-10-20 DIAGNOSIS — N289 Disorder of kidney and ureter, unspecified: Secondary | ICD-10-CM

## 2013-10-20 DIAGNOSIS — I160 Hypertensive urgency: Secondary | ICD-10-CM | POA: Diagnosis present

## 2013-10-20 DIAGNOSIS — I5033 Acute on chronic diastolic (congestive) heart failure: Secondary | ICD-10-CM | POA: Diagnosis present

## 2013-10-20 DIAGNOSIS — R400 Somnolence: Secondary | ICD-10-CM

## 2013-10-20 DIAGNOSIS — I119 Hypertensive heart disease without heart failure: Secondary | ICD-10-CM

## 2013-10-20 DIAGNOSIS — I129 Hypertensive chronic kidney disease with stage 1 through stage 4 chronic kidney disease, or unspecified chronic kidney disease: Principal | ICD-10-CM | POA: Diagnosis present

## 2013-10-20 DIAGNOSIS — I2510 Atherosclerotic heart disease of native coronary artery without angina pectoris: Secondary | ICD-10-CM | POA: Diagnosis present

## 2013-10-20 DIAGNOSIS — F17200 Nicotine dependence, unspecified, uncomplicated: Secondary | ICD-10-CM | POA: Diagnosis present

## 2013-10-20 DIAGNOSIS — Z982 Presence of cerebrospinal fluid drainage device: Secondary | ICD-10-CM

## 2013-10-20 DIAGNOSIS — N179 Acute kidney failure, unspecified: Secondary | ICD-10-CM | POA: Diagnosis present

## 2013-10-20 DIAGNOSIS — Z8673 Personal history of transient ischemic attack (TIA), and cerebral infarction without residual deficits: Secondary | ICD-10-CM

## 2013-10-20 DIAGNOSIS — Z720 Tobacco use: Secondary | ICD-10-CM

## 2013-10-20 DIAGNOSIS — R112 Nausea with vomiting, unspecified: Secondary | ICD-10-CM

## 2013-10-20 DIAGNOSIS — Z8679 Personal history of other diseases of the circulatory system: Secondary | ICD-10-CM

## 2013-10-20 DIAGNOSIS — R404 Transient alteration of awareness: Secondary | ICD-10-CM | POA: Diagnosis present

## 2013-10-20 DIAGNOSIS — Z79899 Other long term (current) drug therapy: Secondary | ICD-10-CM

## 2013-10-20 DIAGNOSIS — N189 Chronic kidney disease, unspecified: Secondary | ICD-10-CM

## 2013-10-20 DIAGNOSIS — I701 Atherosclerosis of renal artery: Secondary | ICD-10-CM

## 2013-10-20 DIAGNOSIS — E7800 Pure hypercholesterolemia, unspecified: Secondary | ICD-10-CM | POA: Diagnosis present

## 2013-10-20 DIAGNOSIS — I5032 Chronic diastolic (congestive) heart failure: Secondary | ICD-10-CM

## 2013-10-20 DIAGNOSIS — I639 Cerebral infarction, unspecified: Secondary | ICD-10-CM

## 2013-10-20 DIAGNOSIS — G40909 Epilepsy, unspecified, not intractable, without status epilepticus: Secondary | ICD-10-CM | POA: Diagnosis present

## 2013-10-20 DIAGNOSIS — I10 Essential (primary) hypertension: Secondary | ICD-10-CM

## 2013-10-20 DIAGNOSIS — N184 Chronic kidney disease, stage 4 (severe): Secondary | ICD-10-CM | POA: Diagnosis present

## 2013-10-20 DIAGNOSIS — Z9119 Patient's noncompliance with other medical treatment and regimen: Secondary | ICD-10-CM

## 2013-10-20 DIAGNOSIS — Z880 Allergy status to penicillin: Secondary | ICD-10-CM

## 2013-10-20 HISTORY — DX: Cerebral infarction, unspecified: I63.9

## 2013-10-20 LAB — BASIC METABOLIC PANEL
BUN: 39 mg/dL — ABNORMAL HIGH (ref 6–23)
CALCIUM: 9.3 mg/dL (ref 8.4–10.5)
CO2: 19 meq/L (ref 19–32)
Chloride: 101 mEq/L (ref 96–112)
Creatinine, Ser: 3.19 mg/dL — ABNORMAL HIGH (ref 0.50–1.10)
GFR calc Af Amer: 18 mL/min — ABNORMAL LOW (ref >90)
GFR calc non Af Amer: 16 mL/min — ABNORMAL LOW (ref >90)
GLUCOSE: 93 mg/dL (ref 70–99)
Potassium: 4.6 mEq/L (ref 3.7–5.3)
Sodium: 137 mEq/L (ref 137–147)

## 2013-10-20 LAB — RAPID URINE DRUG SCREEN, HOSP PERFORMED
Amphetamines: NOT DETECTED
BARBITURATES: NOT DETECTED
BENZODIAZEPINES: NOT DETECTED
Cocaine: NOT DETECTED
Opiates: NOT DETECTED
Tetrahydrocannabinol: NOT DETECTED

## 2013-10-20 LAB — CBC
HCT: 36 % (ref 36.0–46.0)
HEMOGLOBIN: 12 g/dL (ref 12.0–15.0)
MCH: 28.8 pg (ref 26.0–34.0)
MCHC: 33.3 g/dL (ref 30.0–36.0)
MCV: 86.5 fL (ref 78.0–100.0)
Platelets: 293 10*3/uL (ref 150–400)
RBC: 4.16 MIL/uL (ref 3.87–5.11)
RDW: 15.9 % — AB (ref 11.5–15.5)
WBC: 6.7 10*3/uL (ref 4.0–10.5)

## 2013-10-20 LAB — URINALYSIS, ROUTINE W REFLEX MICROSCOPIC
Bilirubin Urine: NEGATIVE
Glucose, UA: NEGATIVE mg/dL
KETONES UR: NEGATIVE mg/dL
Leukocytes, UA: NEGATIVE
NITRITE: NEGATIVE
PH: 5.5 (ref 5.0–8.0)
Protein, ur: >300 mg/dL — AB
Specific Gravity, Urine: 1.019 (ref 1.005–1.030)
Urobilinogen, UA: 1 mg/dL (ref 0.0–1.0)

## 2013-10-20 LAB — URINE MICROSCOPIC-ADD ON

## 2013-10-20 LAB — TROPONIN I

## 2013-10-20 LAB — MRSA PCR SCREENING: MRSA by PCR: NEGATIVE

## 2013-10-20 MED ORDER — GUAIFENESIN ER 600 MG PO TB12
600.0000 mg | ORAL_TABLET | Freq: Two times a day (BID) | ORAL | Status: DC
  Administered 2013-10-20 – 2013-10-23 (×6): 600 mg via ORAL
  Filled 2013-10-20 (×7): qty 1

## 2013-10-20 MED ORDER — HYDRALAZINE HCL 50 MG PO TABS
100.0000 mg | ORAL_TABLET | Freq: Three times a day (TID) | ORAL | Status: DC
  Administered 2013-10-20 – 2013-10-23 (×9): 100 mg via ORAL
  Filled 2013-10-20 (×10): qty 2

## 2013-10-20 MED ORDER — NITROGLYCERIN IN D5W 200-5 MCG/ML-% IV SOLN
2.0000 ug/min | INTRAVENOUS | Status: DC
  Administered 2013-10-20: 25 ug/min via INTRAVENOUS
  Administered 2013-10-20: 50 ug/min via INTRAVENOUS
  Administered 2013-10-20: 45 ug/min via INTRAVENOUS
  Administered 2013-10-20: 10 ug/min via INTRAVENOUS
  Administered 2013-10-20: 35 ug/min via INTRAVENOUS
  Administered 2013-10-20: 15 ug/min via INTRAVENOUS
  Administered 2013-10-20: 40 ug/min via INTRAVENOUS
  Administered 2013-10-20: 20 ug/min via INTRAVENOUS
  Administered 2013-10-20: 30 ug/min via INTRAVENOUS
  Administered 2013-10-20: 5 ug/min via INTRAVENOUS
  Administered 2013-10-21: 100 ug/min via INTRAVENOUS
  Filled 2013-10-20 (×3): qty 250

## 2013-10-20 MED ORDER — SODIUM CHLORIDE 0.9 % IJ SOLN
3.0000 mL | Freq: Two times a day (BID) | INTRAMUSCULAR | Status: DC
Start: 2013-10-20 — End: 2013-10-23
  Administered 2013-10-21 – 2013-10-23 (×3): 3 mL via INTRAVENOUS

## 2013-10-20 MED ORDER — ENOXAPARIN SODIUM 30 MG/0.3ML ~~LOC~~ SOLN
30.0000 mg | Freq: Every day | SUBCUTANEOUS | Status: DC
  Administered 2013-10-20 – 2013-10-21 (×2): 30 mg via SUBCUTANEOUS
  Filled 2013-10-20 (×4): qty 0.3

## 2013-10-20 MED ORDER — ONDANSETRON HCL 4 MG/2ML IJ SOLN
4.0000 mg | Freq: Four times a day (QID) | INTRAMUSCULAR | Status: DC | PRN
  Administered 2013-10-21: 4 mg via INTRAVENOUS
  Filled 2013-10-20: qty 2

## 2013-10-20 MED ORDER — GUAIFENESIN-DM 100-10 MG/5ML PO SYRP: 5.0000 mL | ORAL_SOLUTION | ORAL | Status: DC | PRN

## 2013-10-20 MED ORDER — LABETALOL HCL 200 MG PO TABS
200.0000 mg | ORAL_TABLET | Freq: Three times a day (TID) | ORAL | Status: DC
  Administered 2013-10-20 – 2013-10-23 (×6): 200 mg via ORAL
  Filled 2013-10-20 (×10): qty 1

## 2013-10-20 MED ORDER — HYDROCODONE-ACETAMINOPHEN 5-325 MG PO TABS
1.0000 | ORAL_TABLET | ORAL | Status: DC | PRN
  Administered 2013-10-21: 1 via ORAL
  Filled 2013-10-20 (×2): qty 1

## 2013-10-20 MED ORDER — ONDANSETRON HCL 4 MG PO TABS: 4.0000 mg | ORAL_TABLET | Freq: Four times a day (QID) | ORAL | Status: DC | PRN

## 2013-10-20 MED ORDER — LABETALOL HCL 5 MG/ML IV SOLN
20.0000 mg | INTRAVENOUS | Status: AC | PRN
  Administered 2013-10-20 (×5): 20 mg via INTRAVENOUS
  Filled 2013-10-20 (×5): qty 4

## 2013-10-20 MED ORDER — FUROSEMIDE 10 MG/ML IJ SOLN
40.0000 mg | Freq: Two times a day (BID) | INTRAMUSCULAR | Status: DC
  Administered 2013-10-21 – 2013-10-22 (×4): 40 mg via INTRAVENOUS
  Filled 2013-10-20 (×6): qty 4

## 2013-10-20 MED ORDER — SODIUM BICARBONATE 650 MG PO TABS
650.0000 mg | ORAL_TABLET | Freq: Three times a day (TID) | ORAL | Status: DC
  Administered 2013-10-20 – 2013-10-23 (×9): 650 mg via ORAL
  Filled 2013-10-20 (×10): qty 1

## 2013-10-20 MED ORDER — ALBUTEROL SULFATE (2.5 MG/3ML) 0.083% IN NEBU
2.5000 mg | INHALATION_SOLUTION | Freq: Four times a day (QID) | RESPIRATORY_TRACT | Status: DC
  Administered 2013-10-20: 2.5 mg via RESPIRATORY_TRACT
  Filled 2013-10-20: qty 3

## 2013-10-20 MED ORDER — ACETAMINOPHEN 650 MG RE SUPP: 650.0000 mg | Freq: Four times a day (QID) | RECTAL | Status: DC | PRN

## 2013-10-20 MED ORDER — CALCITRIOL 0.25 MCG PO CAPS
0.2500 ug | ORAL_CAPSULE | ORAL | Status: DC
  Administered 2013-10-21: 0.25 ug via ORAL
  Filled 2013-10-20: qty 1

## 2013-10-20 MED ORDER — ACETAMINOPHEN 325 MG PO TABS: 650.0000 mg | ORAL_TABLET | Freq: Four times a day (QID) | ORAL | Status: DC | PRN

## 2013-10-20 MED ORDER — AMLODIPINE BESYLATE 10 MG PO TABS
10.0000 mg | ORAL_TABLET | Freq: Every day | ORAL | Status: DC
  Administered 2013-10-20 – 2013-10-23 (×4): 10 mg via ORAL
  Filled 2013-10-20 (×4): qty 1

## 2013-10-20 NOTE — ED Provider Notes (Signed)
CSN: [id number]     Arrival date & time 10/20/13  1543 History   First MD Initiated Contact with Patient 10/20/13 1605     Chief Complaint  Patient presents with  . Hypertension  . Shortness of Breath   (Consider location/radiation/quality/duration/timing/severity/associated sxs/prior Treatment) Patient is a 53 y.o. female presenting with hypertension and shortness of breath. The history is provided by the patient and the EMS personnel.  Hypertension Associated symptoms include shortness of breath.  Shortness of Breath   Past Medical History  Diagnosis Date  . HTN (hypertension)     resistant  . Hypercholesterolemia   . Tobacco abuse     resumed smoking half a pack a day. She was a smoker in the past and states she was abl eto stop in the past using nicotine patches  . CAD (coronary artery disease)     Pt has St elevation MI in Feb 2009. Left hear tcath at tha ttime showed a 70% distal LAD lesion that was hazy consistent with a plaque rupture. She had a 50% distal circumflex stenosis an a 95% distal RCA stenosis. She did havve drug eluting stent placed in her distal RCA  . Chronic kidney disease     most recent creatinine 1.4 in 3/10  . Intracranial hemorrhage, spontaneous intraparenchymal, idiopathic, remote, resolved   . Diastolic heart failure     pt most recent echo was in Feb 2009 w an EF of 60% and severe left ventricular hypertrophy. The pt did have evidence of diastolic dysfunction. The RV appeared normal  . Subarachnoid hemorrhage     03/2012  . Aneurysm   . CHF (congestive heart failure)     diastolic   Past Surgical History  Procedure Laterality Date  . Ventriculoperitoneal shunt  03/27/2012    Procedure: SHUNT INSERTION VENTRICULAR-PERITONEAL;  Surgeon: Winfield Cunas, MD;  Location: [place] [facility];  Service: Neurosurgery;  Laterality: N/A;  Ventricular-Peritoneal Shunt Insertion  . Cardiac catheterization    . Coronary angioplasty    . Abdominal hysterectomy    .  Bascilic vein transposition Right 05/13/2013    Procedure: [place];  Surgeon: Rosetta Posner, MD;  Location: [facility] OR;  Service: Vascular;  Laterality: Right;   Family History  Problem Relation Age of Onset  . Coronary artery disease Other     premature in 1st degree relatives  . Heart disease Mother   . Heart disease Father    History  Substance Use Topics  . Smoking status: Current Every Day Smoker -- 0.20 packs/day for 35 years    Types: Cigarettes  . Smokeless tobacco: Never Used     Comment: 1/2 ppd   . Alcohol Use: No   OB History   Grav Para Term Preterm Abortions TAB SAB Ect Mult Living                 Review of Systems  Respiratory: Positive for shortness of breath.     Allergies  Ampicillin and Penicillins  Home Medications   No current outpatient prescriptions on file. BP 207/105  Pulse 75  Temp(Src) 97.5 F (36.4 C) (Oral)  Resp 23  Ht 5\' 1"  (1.549 m)  Wt 142 lb 6.7 oz (64.6 kg)  BMI 26.92 kg/m2  SpO2 100% Physical Exam  Nursing note and vitals reviewed. Constitutional: She is oriented to person, place, and time. She appears well-developed.  Undernourished, she appears much older than stated age  HENT:  Head: Normocephalic and atraumatic.  Eyes:  Conjunctivae and EOM are normal. Pupils are equal, round, and reactive to light.  Mild pupillary asymmetry, right greater than left, with equal reactivity, bilaterally  Neck: Normal range of motion and phonation normal. Neck supple.  Cardiovascular: Normal rate, regular rhythm and intact distal pulses.   Marked hypertension  Pulmonary/Chest: Effort normal and breath sounds normal. No respiratory distress. She has no wheezes. She exhibits no tenderness.  Abdominal: Soft. She exhibits no distension. There is no tenderness. There is no guarding.  Musculoskeletal: Normal range of motion.  Neurological: She is alert and oriented to person, place, and time. She exhibits normal muscle tone.  Skin:  Skin is warm and dry.  Psychiatric: She has a normal mood and affect. Her behavior is normal.    ED Course  Procedures (including critical care time) Medications  nitroGLYCERIN 0.2 mg/mL in dextrose 5 % infusion (85 mcg/min Intravenous Rate/Dose Change 10/20/13 2320)  sodium bicarbonate tablet 650 mg (650 mg Oral Given 10/20/13 2251)  amLODipine (NORVASC) tablet 10 mg (10 mg Oral Given 10/20/13 2251)  calcitRIOL (ROCALTROL) capsule 0.25 mcg (not administered)  hydrALAZINE (APRESOLINE) tablet 100 mg (100 mg Oral Given 10/20/13 2251)  labetalol (NORMODYNE) tablet 200 mg (200 mg Oral Given 10/20/13 2251)  sodium chloride 0.9 % injection 3 mL (3 mLs Intravenous Not Given 10/20/13 2251)  acetaminophen (TYLENOL) tablet 650 mg (not administered)    Or  acetaminophen (TYLENOL) suppository 650 mg (not administered)  HYDROcodone-acetaminophen (NORCO/VICODIN) 5-325 MG per tablet 1-2 tablet (not administered)  ondansetron (ZOFRAN) tablet 4 mg (not administered)    Or  ondansetron (ZOFRAN) injection 4 mg (not administered)  enoxaparin (LOVENOX) injection 30 mg (30 mg Subcutaneous Given 10/20/13 2251)  albuterol (PROVENTIL) (2.5 MG/3ML) 0.083% nebulizer solution 2.5 mg (2.5 mg Nebulization Given 10/20/13 2217)  guaiFENesin (MUCINEX) 12 hr tablet 600 mg (600 mg Oral Given 10/20/13 2251)  guaiFENesin-dextromethorphan (ROBITUSSIN DM) 100-10 MG/5ML syrup 5 mL (not administered)  furosemide (LASIX) injection 40 mg (not administered)  labetalol (NORMODYNE,TRANDATE) injection 20 mg (20 mg Intravenous Given 10/20/13 1906)    Patient Vitals for the past 24 hrs:  BP Temp Temp src Pulse Resp SpO2 Height Weight  10/20/13 2330 207/105 mmHg - - 75 23 100 % - -  10/20/13 2315 212/101 mmHg - - 73 20 100 % - -  10/20/13 2303 238/109 mmHg - - 75 22 100 % - -  10/20/13 2251 230/105 mmHg - - 77 - - - -  10/20/13 2245 230/105 mmHg - - 75 25 100 % - -  10/20/13 2230 237/119 mmHg - - 73 29 100 % - -  10/20/13 2217 - - - - - 100 % - -   10/20/13 2215 230/114 mmHg - - 74 17 100 % - -  10/20/13 2200 224/125 mmHg - - 73 29 100 % - -  10/20/13 2149 - 97.5 F (36.4 C) Oral - - - - -  10/20/13 2147 216/114 mmHg - - 74 24 100 % - -  10/20/13 2144 - 97.4 F (36.3 C) Oral - - - 5\' 1"  (1.549 m) 142 lb 6.7 oz (64.6 kg)  10/20/13 2106 198/104 mmHg - - - - - - -  10/20/13 1919 166/98 mmHg - - - - - - -  10/20/13 1910 170/100 mmHg - - - - - - -  10/20/13 1903 170/110 mmHg - - - - - - -  10/20/13 1857 170/110 mmHg - - - - - - -  10/20/13 1849 168/100  mmHg - - - - - - -  10/20/13 1842 174/96 mmHg - - - - - - -  10/20/13 1827 170/98 mmHg - - - - - - -  10/20/13 1823 180/100 mmHg - - - - - - -  10/20/13 1813 188/100 mmHg - - - - - - -  10/20/13 1810 - - - 74 18 99 % - -  10/20/13 1807 182/100 mmHg - - - - - - -  10/20/13 1803 188/98 mmHg - - - - - - -  10/20/13 1758 188/100 mmHg - - - - - - -  10/20/13 1754 188/110 mmHg - - 76 - 100 % - -  10/20/13 1750 - - - 77 25 97 % - -  10/20/13 1749 220/110 mmHg - - - - - - -  10/20/13 1745 - - - 89 34 100 % - -  10/20/13 1740 - - - 88 26 100 % - -  10/20/13 1738 220/120 mmHg - - - - - - -  10/20/13 1735 220/128 mmHg - - - - - - -  10/20/13 1731 230/130 mmHg - - - - 99 % - -  10/20/13 1715 - - - 96 30 100 % - -  10/20/13 1659 249/123 mmHg - - 89 - 100 % - -  10/20/13 1600 - 97.6 F (36.4 C) Oral 94 20 100 % - -  10/20/13 1558 242/120 mmHg - - - - - - -  10/20/13 1557 252/121 mmHg - Oral 93 38 100 % - -   5:31 PM Reevaluation with update and discussion. After initial assessment and treatment, an updated evaluation reveals she states she is feeling somewhat better at this time. Nursing reports that she has been removing her blood pressure cuff, so some values were missed. At this time, the blood pressure is 230/130 in the left arm, revealing some improvement in the systolic blood pressure after a nitroglycerin drip has been started. Dayzha Pogosyan L   Goal for BP reduction is MAP  124  6:46 PM Reevaluation with update and discussion. After initial assessment and treatment, an updated evaluation reveals MAP=122. Fredrick Dray L   CRITICAL CARE Performed by: Richarda Blade Total critical care time: 75 minutes Critical care time was exclusive of separately billable procedures and treating other patients. Critical care was necessary to treat or prevent imminent or life-threatening deterioration. Critical care was time spent personally by me on the following activities: development of treatment plan with patient and/or surrogate as well as nursing, discussions with consultants, evaluation of patient's response to treatment, examination of patient, obtaining history from patient or surrogate, ordering and performing treatments and interventions, ordering and review of laboratory studies, ordering and review of radiographic studies, pulse oximetry and re-evaluation of patient's condition.   Labs Review Labs Reviewed  CBC - Abnormal; Notable for the following:    RDW 15.9 (*)    All other components within normal limits  URINALYSIS, ROUTINE W REFLEX MICROSCOPIC - Abnormal; Notable for the following:    APPearance CLOUDY (*)    Hgb urine dipstick MODERATE (*)    Protein, ur >300 (*)    All other components within normal limits  BASIC METABOLIC PANEL - Abnormal; Notable for the following:    BUN 39 (*)    Creatinine, Ser 3.19 (*)    GFR calc non Af Amer 16 (*)    GFR calc Af Amer 18 (*)    All other components within normal limits  MRSA  PCR SCREENING  STOOL CULTURE  URINE RAPID DRUG SCREEN (HOSP PERFORMED)  TROPONIN I  URINE MICROSCOPIC-ADD ON  INFLUENZA PANEL BY PCR  MAGNESIUM  PHOSPHORUS  TSH  COMPREHENSIVE METABOLIC PANEL  CBC  FECAL LACTOFERRIN  TROPONIN I  TROPONIN I  TROPONIN I   Imaging Review Dg Chest 2 View  10/20/2013   CLINICAL DATA:  Cough, congestion, fever  EXAM: CHEST  2 VIEW  COMPARISON:  10/13/2013  FINDINGS: Moderate cardiac enlargement.  VP shunt tubing projects over the right thorax. There is moderate vascular congestion, which represents a change from the prior study. The perihilar vessels are mildly indistinct.  IMPRESSION: Congestive heart failure with mild pulmonary edema. Vascular congestion is worse when compared to the prior study.   Electronically Signed   By: Skipper Cliche M.D.   On: 10/20/2013 21:02    EKG Interpretation    Date/Time:  Thursday October 20 2013 15:53:01 EST Ventricular Rate:  93 PR Interval:  174 QRS Duration: 84 QT Interval:  399 QTC Calculation: 496 R Axis:   114 Text Interpretation:  Sinus rhythm Left atrial enlargement Anterior infarct, old since last tracing no significant change Confirmed by Eulis Foster  MD, Choua Ikner ([id number]) on 10/20/2013 6:54:35 PM            MDM   1. Hypertensive urgency   2. Renal insufficiency   3. Acute-on-chronic renal failure   4. Chronic diastolic heart failure   5. Nausea and vomiting      Hypertensive crisis, recurrent, with history of medical noncompliance. No evidence for CVA, or acute coronary syndrome. She will need to be admitted for further monitoring, management and possibly changing her medication treatment. She has renal insufficiency with hyperkalemia or uremia. There is no indication for dialysis.  Nursing Notes Reviewed/ Care Coordinated, and agree without changes. Applicable Imaging Reviewed.  Interpretation of Laboratory Data incorporated into ED treatment  Plan: Admit    Richarda Blade, MD 10/21/13 0001

## 2013-10-20 NOTE — ED Notes (Signed)
IV team at bedside 

## 2013-10-20 NOTE — ED Notes (Signed)
Main Lab phlebotomy called to obtain  Labs.

## 2013-10-20 NOTE — ED Notes (Addendum)
Pt from home via [facility] c/o HTN and shortness of breath. She has been dealing with shortness of breath since DEC.7th. Cough productive clear, yellow secretions. EMS BP of 240/160. Sat 100% room air. C/o of nausea. AV graft present in Right Upper arm. Pt reports that she is suppose to start dialysis but unsure when.

## 2013-10-20 NOTE — ED Notes (Signed)
Main lab successful at lab collection.

## 2013-10-20 NOTE — ED Notes (Signed)
MD Eulis Foster aware of BP 210/120.

## 2013-10-20 NOTE — ED Notes (Signed)
Patient's daughter called number is [phone]

## 2013-10-20 NOTE — ED Notes (Signed)
Bed: [id number] Expected date:  Expected time:  Means of arrival:  Comments: EMS-cough/weak

## 2013-10-20 NOTE — H&P (Addendum)
PCP: [facility]   Chief Complaint:  Feeling bad  HPI: [name] is a 53 y.o. female   has a past medical history of HTN (hypertension); Hypercholesterolemia; Tobacco abuse; CAD (coronary artery disease); Chronic kidney disease; Intracranial hemorrhage, spontaneous intraparenchymal, idiopathic, remote, resolved; Diastolic heart failure; Subarachnoid hemorrhage; Aneurysm; and CHF (congestive heart failure).   Presented with  Patient has hx of poorly controlled HTN and medical noncompliance she as admitted for hypertensive urgency in early December. For the past few days she have had a headache, nausea and vomiting, diarrhea. She endorses generalized malaise and muscle aches. Her grandchild has been ill with the same. She have had a cough not sure if had any fever but reports chills. She started to feel very bad today and called 911. Upon arrival to [facility] ED she was found to have HTN with SBP in 252/121 she was started on nitorglycerine gtt. Curent BP 170/110 Hospitalist called for admission. Patient denies any chest pain afebrile in ER.  Review of Systems:     Pertinent positives include: chills, fatigue, headaches, abdominal pain, nausea, vomiting, diarrhea, productive cough,  shortness of breath at rest. Constitutional:  No weight loss, night sweats, Fevers, weight loss  HEENT:  No  Difficulty swallowing,Tooth/dental problems,Sore throat,  No sneezing, itching, ear ache, nasal congestion, post nasal drip,  Cardio-vascular:  No chest pain, Orthopnea, PND, anasarca, dizziness, palpitations.no Bilateral lower extremity swelling  GI:  No heartburn, indigestion,  change in bowel habits, loss of appetite, melena, blood in stool, hematemesis Resp:  no  No dyspnea on exertion, No excess mucus, no No non-productive cough, No coughing up of blood.No change in color of mucus.No wheezing. Skin:  no rash or lesions. No jaundice GU:  no dysuria, change in color of urine, no urgency or frequency.  No straining to urinate.  No flank pain.  Musculoskeletal:  No joint pain or no joint swelling. No decreased range of motion. No back pain.  Psych:  No change in mood or affect. No depression or anxiety. No memory loss.  Neuro: no localizing neurological complaints, no tingling, no weakness, no double vision, no gait abnormality, no slurred speech, no confusion  Otherwise ROS are negative except for above, 10 systems were reviewed  Past Medical History: Past Medical History  Diagnosis Date  . HTN (hypertension)     resistant  . Hypercholesterolemia   . Tobacco abuse     resumed smoking half a pack a day. She was a smoker in the past and states she was abl eto stop in the past using nicotine patches  . CAD (coronary artery disease)     Pt has St elevation MI in Feb 2009. Left hear tcath at tha ttime showed a 70% distal LAD lesion that was hazy consistent with a plaque rupture. She had a 50% distal circumflex stenosis an a 95% distal RCA stenosis. She did havve drug eluting stent placed in her distal RCA  . Chronic kidney disease     most recent creatinine 1.4 in 3/10  . Intracranial hemorrhage, spontaneous intraparenchymal, idiopathic, remote, resolved   . Diastolic heart failure     pt most recent echo was in Feb 2009 w an EF of 60% and severe left ventricular hypertrophy. The pt did have evidence of diastolic dysfunction. The RV appeared normal  . Subarachnoid hemorrhage     03/2012  . Aneurysm   . CHF (congestive heart failure)     diastolic   Past Surgical History  Procedure Laterality  Date  . Ventriculoperitoneal shunt  03/27/2012    Procedure: SHUNT INSERTION VENTRICULAR-PERITONEAL;  Surgeon: Winfield Cunas, MD;  Location: [place] [facility];  Service: Neurosurgery;  Laterality: N/A;  Ventricular-Peritoneal Shunt Insertion  . Cardiac catheterization    . Coronary angioplasty    . Abdominal hysterectomy    . Bascilic vein transposition Right 05/13/2013    Procedure: [place];  Surgeon: Rosetta Posner, MD;  Location: [facility] OR;  Service: Vascular;  Laterality: Right;     Medications: Prior to Admission medications   Medication Sig Start Date End Date Taking? Authorizing Provider  amLODipine (NORVASC) 10 MG tablet Take 1 tablet (10 mg total) by mouth daily. 05/18/13  Yes Janece Canterbury, MD  calcitRIOL (ROCALTROL) 0.25 MCG capsule Take 1 capsule (0.25 mcg total) by mouth every Monday, Wednesday, and Friday. 05/18/13   Janece Canterbury, MD  camphor-menthol [facility]) lotion Apply topically as needed for itching. 05/18/13   Janece Canterbury, MD  furosemide (LASIX) 80 MG tablet Take 1 tablet (80 mg total) by mouth 2 (two) times daily. 05/18/13   Janece Canterbury, MD  hydrALAZINE (APRESOLINE) 100 MG tablet Take 1 tablet (100 mg total) by mouth 3 (three) times daily. 05/18/13   Janece Canterbury, MD  isosorbide mononitrate (IMDUR) 30 MG 24 hr tablet Take 30 mg by mouth daily.    Historical Provider, MD  labetalol (NORMODYNE) 200 MG tablet Take 1 tablet (200 mg total) by mouth 3 (three) times daily. 05/18/13   Janece Canterbury, MD  nitroGLYCERIN (NITROSTAT) 0.4 MG SL tablet Place 1 tablet (0.4 mg total) under the tongue every 5 (five) minutes as needed for chest pain. 05/18/13   Janece Canterbury, MD  sodium bicarbonate 650 MG tablet Take 1 tablet (650 mg total) by mouth 3 (three) times daily. 10/14/13   Barton Dubois, MD    Allergies:   Allergies  Allergen Reactions  . Ampicillin Hives and Rash  . Penicillins Hives and Rash    Social History:  Ambulatory  independently   Lives at  Home with family    reports that she has been smoking Cigarettes.  She has a 7 pack-year smoking history. She has never used smokeless tobacco. She reports that she does not drink alcohol or use illicit drugs.   Family History: family history includes Coronary artery disease in her other; Heart disease in her father and mother.    Physical Exam: Patient Vitals for the past 24 hrs:  BP  Temp Temp src Pulse Resp SpO2  10/20/13 1919 166/98 mmHg - - - - -  10/20/13 1910 170/100 mmHg - - - - -  10/20/13 1903 170/110 mmHg - - - - -  10/20/13 1857 170/110 mmHg - - - - -  10/20/13 1849 168/100 mmHg - - - - -  10/20/13 1842 174/96 mmHg - - - - -  10/20/13 1827 170/98 mmHg - - - - -  10/20/13 1823 180/100 mmHg - - - - -  10/20/13 1813 188/100 mmHg - - - - -  10/20/13 1810 - - - 74 18 99 %  10/20/13 1807 182/100 mmHg - - - - -  10/20/13 1803 188/98 mmHg - - - - -  10/20/13 1758 188/100 mmHg - - - - -  10/20/13 1754 188/110 mmHg - - 76 - 100 %  10/20/13 1750 - - - 77 25 97 %  10/20/13 1749 220/110 mmHg - - - - -  10/20/13 1745 - - - 89 34 100 %  10/20/13 1740 - - - 88 26 100 %  10/20/13 1738 220/120 mmHg - - - - -  10/20/13 1735 220/128 mmHg - - - - -  10/20/13 1731 230/130 mmHg - - - - 99 %  10/20/13 1715 - - - 96 30 100 %  10/20/13 1659 249/123 mmHg - - 89 - 100 %  10/20/13 1600 - 97.6 F (36.4 C) Oral 94 20 100 %  10/20/13 1558 242/120 mmHg - - - - -  10/20/13 1557 252/121 mmHg - Oral 93 38 100 %    1. General:  in No Acute distress 2. Psychological: Alert and Oriented 3. Head/ENT:   Moist   Mucous Membranes                          Head Non traumatic, neck supple                          Normal  Dentition 4. SKIN:   decreased Skin turgor,  Skin clean Dry and intact no rash 5. Heart: Regular rate and rhythm no Murmur, Rub or gallop 6. Lungs:  no wheezes occasional crackles   7. Abdomen: Soft, non-tender, Non distended 8. Lower extremities: no clubbing, cyanosis, or edema 9. Neurologically Grossly intact, moving all 4 extremities equally 10. MSK: Normal range of motion  body mass index is unknown because there is no weight on file.   Labs on Admission:   Recent Labs  10/20/13 1735  NA 137  K 4.6  CL 101  CO2 19  GLUCOSE 93  BUN 39*  CREATININE 3.19*  CALCIUM 9.3   No results found for this basename: AST, ALT, ALKPHOS, BILITOT, PROT, ALBUMIN,  in  the last 72 hours No results found for this basename: LIPASE, AMYLASE,  in the last 72 hours  Recent Labs  10/20/13 1735  WBC 6.7  HGB 12.0  HCT 36.0  MCV 86.5  PLT 293    Recent Labs  10/20/13 1735  TROPONINI <0.30   No results found for this basename: TSH, T4TOTAL, FREET3, T3FREE, THYROIDAB,  in the last 72 hours No results found for this basename: VITAMINB12, FOLATE, FERRITIN, TIBC, IRON, RETICCTPCT,  in the last 72 hours No results found for this basename: HGBA1C    The CrCl is unknown because both a height and weight (above a minimum accepted value) are required for this calculation. ABG    Component Value Date/Time   PHART 7.489* 03/27/2012 2300   HCO3 18.3* 03/27/2012 2300   TCO2 18 04/30/2012 0149   ACIDBASEDEF 4.7* 03/27/2012 2300   O2SAT 99.5 03/27/2012 2300     Lab Results  Component Value Date   DDIMER  Value: 0.43        AT THE INHOUSE ESTABLISHED CUTOFF VALUE OF 0.48 ug/mL FEU, THIS ASSAY HAS BEEN DOCUMENTED IN THE LITERATURE TO HAVE 12/16/2007     Other results:  I have pearsonaly reviewed this: ECG REPORT  Rate: 93  Rhythm: NSR ST&T Change: no ischemia Axis 114  UA no UTI   Cultures:    Component Value Date/Time   SDES URINE, CATHETERIZED 09/27/2012 2039   SPECREQUEST NONE 09/27/2012 2039   CULT NO GROWTH 09/27/2012 2039   REPTSTATUS 09/29/2012 FINAL 09/27/2012 2039       Radiological Exams on Admission: No results found.  Chart has been reviewed  Assessment/Plan 53 yo female with history of diastolic heart failure,  chronic kidney disease and noncompliance presents with hypertensive urgency and symptoms of viral illness  Present on Admission:  . Accelerated hypertension/Hypertensive urgency- continue nitroglycerin drip avoid over aggressive correction we'll attempt to restart home medications if able to tolerate  Chronic kidney disease creatinine is currently at baseline . DIASTOLIC HEART FAILURE, CHRONIC - evidence of pulmonary edema on chest  x-ray, patient also has nausea vomiting diarrhea would attempt to avoid over diuresis,   . CAD (coronary artery disease) - denies chest pain will cycle cardiac markers  possible viral illness will check for influenza and stool cultures   Prophylaxis:  Lovenox, Protonix  CODE STATUS: FULL CODE  Other plan as per orders.  I have spent a total of 55 min on this admission  Natali Lavallee 10/20/2013, 8:17 PM

## 2013-10-21 DIAGNOSIS — I509 Heart failure, unspecified: Secondary | ICD-10-CM

## 2013-10-21 DIAGNOSIS — I10 Essential (primary) hypertension: Secondary | ICD-10-CM

## 2013-10-21 LAB — TROPONIN I
Troponin I: <0.3 ng/mL (ref <0.30)
Troponin I: <0.3 ng/mL (ref <0.30)

## 2013-10-21 LAB — INFLUENZA PANEL BY PCR (TYPE A & B)
H1N1 flu by pcr: NOT DETECTED
INFLBPCR: NEGATIVE
Influenza A By PCR: NEGATIVE

## 2013-10-21 LAB — COMPREHENSIVE METABOLIC PANEL
ALT: 67 U/L — ABNORMAL HIGH (ref 0–35)
AST: 51 U/L — ABNORMAL HIGH (ref 0–37)
Albumin: 2.5 g/dL — ABNORMAL LOW (ref 3.5–5.2)
Alkaline Phosphatase: 69 U/L (ref 39–117)
BUN: 41 mg/dL — ABNORMAL HIGH (ref 6–23)
CO2: 17 mEq/L — ABNORMAL LOW (ref 19–32)
Calcium: 8.5 mg/dL (ref 8.4–10.5)
Chloride: 103 mEq/L (ref 96–112)
Creatinine, Ser: 3.11 mg/dL — ABNORMAL HIGH (ref 0.50–1.10)
GFR calc non Af Amer: 16 mL/min — ABNORMAL LOW (ref >90)
GFR, EST AFRICAN AMERICAN: 19 mL/min — AB (ref >90)
GLUCOSE: 100 mg/dL — AB (ref 70–99)
Potassium: 5.2 mEq/L (ref 3.7–5.3)
Sodium: 134 mEq/L — ABNORMAL LOW (ref 137–147)
Total Bilirubin: 0.5 mg/dL (ref 0.3–1.2)
Total Protein: 5.6 g/dL — ABNORMAL LOW (ref 6.0–8.3)

## 2013-10-21 LAB — PHOSPHORUS: Phosphorus: 4.3 mg/dL (ref 2.3–4.6)

## 2013-10-21 LAB — CBC
HEMATOCRIT: 31.9 % — AB (ref 36.0–46.0)
HEMOGLOBIN: 10.7 g/dL — AB (ref 12.0–15.0)
MCH: 28.6 pg (ref 26.0–34.0)
MCHC: 33.5 g/dL (ref 30.0–36.0)
MCV: 85.3 fL (ref 78.0–100.0)
Platelets: 242 10*3/uL (ref 150–400)
RBC: 3.74 MIL/uL — ABNORMAL LOW (ref 3.87–5.11)
RDW: 15.8 % — ABNORMAL HIGH (ref 11.5–15.5)
WBC: 6.3 10*3/uL (ref 4.0–10.5)

## 2013-10-21 LAB — MAGNESIUM: Magnesium: 1.9 mg/dL (ref 1.5–2.5)

## 2013-10-21 LAB — TSH: TSH: 1.917 u[IU]/mL (ref 0.350–4.500)

## 2013-10-21 MED ORDER — SODIUM CHLORIDE 0.9 % IV SOLN
INTRAVENOUS | Status: DC
  Administered 2013-10-21: 10 mL/h via INTRAVENOUS

## 2013-10-21 MED ORDER — LEVETIRACETAM 500 MG PO TABS
500.0000 mg | ORAL_TABLET | Freq: Two times a day (BID) | ORAL | Status: DC
  Administered 2013-10-21 – 2013-10-23 (×4): 500 mg via ORAL
  Filled 2013-10-21 (×5): qty 1

## 2013-10-21 MED ORDER — IPRATROPIUM-ALBUTEROL 0.5-2.5 (3) MG/3ML IN SOLN
3.0000 mL | Freq: Three times a day (TID) | RESPIRATORY_TRACT | Status: DC
  Administered 2013-10-21 – 2013-10-23 (×5): 3 mL via RESPIRATORY_TRACT
  Filled 2013-10-21 (×9): qty 3

## 2013-10-21 MED ORDER — ATORVASTATIN CALCIUM 80 MG PO TABS
80.0000 mg | ORAL_TABLET | Freq: Every day | ORAL | Status: DC
  Administered 2013-10-21 – 2013-10-23 (×3): 80 mg via ORAL
  Filled 2013-10-21 (×3): qty 1

## 2013-10-21 MED ORDER — TAMSULOSIN HCL 0.4 MG PO CAPS
0.4000 mg | ORAL_CAPSULE | Freq: Every day | ORAL | Status: DC
  Administered 2013-10-21 – 2013-10-23 (×3): 0.4 mg via ORAL
  Filled 2013-10-21 (×3): qty 1

## 2013-10-21 MED ORDER — DIPHENHYDRAMINE HCL 25 MG PO CAPS
25.0000 mg | ORAL_CAPSULE | ORAL | Status: DC | PRN
  Administered 2013-10-21 (×2): 25 mg via ORAL
  Filled 2013-10-21 (×2): qty 1

## 2013-10-21 MED ORDER — CLONIDINE HCL 0.3 MG PO TABS
0.3000 mg | ORAL_TABLET | Freq: Three times a day (TID) | ORAL | Status: DC
  Administered 2013-10-21 – 2013-10-23 (×8): 0.3 mg via ORAL
  Filled 2013-10-21 (×9): qty 1

## 2013-10-21 MED ORDER — ALBUTEROL SULFATE (2.5 MG/3ML) 0.083% IN NEBU: 2.5000 mg | INHALATION_SOLUTION | Freq: Four times a day (QID) | RESPIRATORY_TRACT | Status: DC | PRN

## 2013-10-21 NOTE — Progress Notes (Signed)
INITIAL NUTRITION ASSESSMENT  DOCUMENTATION CODES Per approved criteria  -Not Applicable   INTERVENTION: - Diet advancement per MD, recommend renal diet - Will continue to monitor   NUTRITION DIAGNOSIS: Inadequate oral intake related to inability to eat as evidenced by NPO.   Goal: Advance diet as tolerated to renal diet  Monitor:  Weights, labs, diet advancement  Reason for Assessment: Malnutrition screening tool   53 y.o. female  Admitting Dx: Hypertensive urgency and symptoms of viral illness  ASSESSMENT: Patient has hx of poorly controlled HTN and medical noncompliance she as admitted for hypertensive urgency in early December. For the past few days she have had a headache, nausea and vomiting, diarrhea. She endorses generalized malaise and muscle aches. Her grandchild has been ill with the same. She have had a cough not sure if had any fever but reports chills. Hx of CHF and CKD with fistula for dialysis but has not started it yet.   Met with pt who reports not eating well for a few days due to nausea/vomiting but reports before then she was eating well, 3-4 meals/day. Reports 20-40 pound unintended weight loss but is unsure over what time frame this occurred and does not know why she was losing weight. Past weight trend shows pt's weight stable in the past several months. Pt reports being on Ensure in the past. Denies any nausea/vomiting today. Started to fall asleep at the end of visit, unable to do nutrition focused physical exam.   BUN/Cr elevated with low GFR AST/ALT elevated    Height: Ht Readings from Last 1 Encounters:  10/20/13 _0  (1.549 m)    Weight: Wt Readings from Last 1 Encounters:  10/20/13 142 lb 6.7 oz (64.6 kg)    Ideal Body Weight: 105 lb   % Ideal Body Weight: 135%  Wt Readings from Last 10 Encounters:  10/20/13 142 lb 6.7 oz (64.6 kg)  10/14/13 140 lb 8 oz (63.73 kg)  09/27/13 137 lb 1.6 oz (62.188 kg)  05/17/13 134 lb 14.7 oz (61.2 kg)   05/14/13 137 lb 12.6 oz (62.5 kg)  05/14/13 137 lb 12.6 oz (62.5 kg)  09/28/12 169 lb (76.658 kg)  05/03/12 141 lb 1.5 oz (64 kg)  04/27/12 147 lb 11.3 oz (67 kg)  04/07/12 162 lb 0.6 oz (73.5 kg)    Usual Body Weight: 140 lb last month  % Usual Body Weight: 101%  BMI:  Body mass index is 26.92 kg/(m^2).  Estimated Nutritional Needs: Kcal: [id number] Protein: 52-65g Fluid: 1.6-1.8L/day  Skin: Intact   Diet Order:  NPO  EDUCATION NEEDS: -No education needs identified at this time   Intake/Output Summary (Last 24 hours) at 10/21/13 1037 Last data filed at 10/21/13 0949  Gross per 24 hour  Intake 588.38 ml  Output    625 ml  Net -36.62 ml    Last BM: PTA  Labs:   Recent Labs Lab 10/20/13 1735 10/21/13 0345  NA 137 134*  K 4.6 5.2  CL 101 103  CO2 19 17*  BUN 39* 41*  CREATININE 3.19* 3.11*  CALCIUM 9.3 8.5  MG  --  1.9  PHOS  --  4.3  GLUCOSE 93 100*    CBG (last 3)  No results found for this basename: GLUCAP,  in the last 72 hours  Scheduled Meds: . amLODipine  10 mg Oral Daily  . calcitRIOL  0.25 mcg Oral Q M,W,F  . cloNIDine  0.3 mg Oral TID  . enoxaparin (LOVENOX) injection  30 mg Subcutaneous QHS  . furosemide  40 mg Intravenous BID  . guaiFENesin  600 mg Oral BID  . hydrALAZINE  100 mg Oral TID  . ipratropium-albuterol  3 mL Nebulization TID  . labetalol  200 mg Oral TID  . sodium bicarbonate  650 mg Oral TID  . sodium chloride  3 mL Intravenous Q12H    Continuous Infusions: . sodium chloride 10 mL/hr (10/21/13 0845)  . nitroGLYCERIN 130 mcg/min (10/21/13 1034)    Past Medical History  Diagnosis Date  . HTN (hypertension)     resistant  . Hypercholesterolemia   . Tobacco abuse     resumed smoking half a pack a day. She was a smoker in the past and states she was abl eto stop in the past using nicotine patches  . CAD (coronary artery disease)     Pt has St elevation MI in Feb 2009. Left hear tcath at tha ttime showed a 70%  distal LAD lesion that was hazy consistent with a plaque rupture. She had a 50% distal circumflex stenosis an a 95% distal RCA stenosis. She did havve drug eluting stent placed in her distal RCA  . Chronic kidney disease     most recent creatinine 1.4 in 3/10  . Intracranial hemorrhage, spontaneous intraparenchymal, idiopathic, remote, resolved   . Diastolic heart failure     pt most recent echo was in Feb 2009 w an EF of 60% and severe left ventricular hypertrophy. The pt did have evidence of diastolic dysfunction. The RV appeared normal  . Subarachnoid hemorrhage     03/2012  . Aneurysm   . CHF (congestive heart failure)     diastolic    Past Surgical History  Procedure Laterality Date  . Ventriculoperitoneal shunt  03/27/2012    Procedure: SHUNT INSERTION VENTRICULAR-PERITONEAL;  Surgeon: Winfield Cunas, MD;  Location: [place] [facility];  Service: Neurosurgery;  Laterality: N/A;  Ventricular-Peritoneal Shunt Insertion  . Cardiac catheterization    . Coronary angioplasty    . Abdominal hysterectomy    . Bascilic vein transposition Right 05/13/2013    Procedure: [place];  Surgeon: Rosetta Posner, MD;  Location: [place];  Service: Vascular;  Laterality: Right;    Mikey College MS, [place], [place] Pager [phone] After Hours Pager

## 2013-10-21 NOTE — Progress Notes (Signed)
[id number]/Rhonda Davis, RN, BSN, CCM [phone] Chart Reviewed for discharge and hospital needs. Discharge needs at time of review:  None present will follow for needs. Review of patient progress due on [id number]. 

## 2013-10-21 NOTE — Progress Notes (Signed)
[facility] PROGRESS NOTE  [name] [id number] DOB: Oct 16, 1961 DOA: 10/20/2013 PCP: No PCP Per Patient  Assessment/Plan: Active Problems:   H/O noncompliance with medical treatment, presenting hazards to health: Concern about patient's frequent admissions and issues of noncompliance. Tomorrow, when she is in less distress, we'll need to discuss this further. Pap should consider psychiatric evaluation    Tobacco abuse: On nicotine patch   CAD (coronary artery disease): Stable   Acute-on-chronic renal failure: Creatinine down to 3.11   Accelerated hypertension: See below   Hypertensive urgency: Able to wean off nitroglycerin drip and transfer to floor. Continue home meds plus diuresis. Acute on chronic diastolic heart failure: Gently diuresing  Code Status: Full code Family Communication: No family present Disposition Plan: Transferred to floor today. Possible discharge home tomorrow   Consultants:  None  Procedures:  None  Antibiotics:  None  HPI/Subjective: Patient somnolent. No acute distress  Objective: Filed Vitals:   10/21/13 1750  BP: 137/75  Pulse: 56  Temp:   Resp: 20    Intake/Output Summary (Last 24 hours) at 10/21/13 1922 Last data filed at 10/21/13 1600  Gross per 24 hour  Intake 863.98 ml  Output   1125 ml  Net -261.02 ml   Filed Weights   10/20/13 2144 10/21/13 1845  Weight: 64.6 kg (142 lb 6.7 oz) 63.322 kg (139 lb 9.6 oz)    Exam:   General:  Somnolent  Cardiovascular: Regular rhythm, 2/6 systolic ejection murmur  Respiratory: Decreased breath sounds her out, more so at the bases  Abdomen: Soft, nontender, nondistended, positive bowel sounds  Musculoskeletal: No clubbing or cyanosis, trace edema   Data Reviewed: Basic Metabolic Panel:  Recent Labs Lab 10/20/13 1735 10/21/13 0345  NA 137 134*  K 4.6 5.2  CL 101 103  CO2 19 17*  GLUCOSE 93 100*  BUN 39* 41*  CREATININE 3.19* 3.11*  CALCIUM 9.3 8.5  MG  --   1.9  PHOS  --  4.3   Liver Function Tests:  Recent Labs Lab 10/21/13 0345  AST 51*  ALT 67*  ALKPHOS 69  BILITOT 0.5  PROT 5.6*  ALBUMIN 2.5*   No results found for this basename: LIPASE, AMYLASE,  in the last 168 hours No results found for this basename: AMMONIA,  in the last 168 hours CBC:  Recent Labs Lab 10/20/13 1735 10/21/13 0345  WBC 6.7 6.3  HGB 12.0 10.7*  HCT 36.0 31.9*  MCV 86.5 85.3  PLT 293 242   Cardiac Enzymes:  Recent Labs Lab 10/20/13 1735 10/20/13 2255 10/21/13 0345 10/21/13 0926  TROPONINI <0.30 <0.30 <0.30 <0.30   BNP (last 3 results)  Recent Labs  05/06/13 1410 05/07/13 0500 10/13/13 2210  PROBNP 26098.0* 20527.0* >70000.0*   CBG: No results found for this basename: GLUCAP,  in the last 168 hours  Recent Results (from the past 240 hour(s))  MRSA PCR SCREENING     Status: None   Collection Time    10/20/13  9:45 PM      Result Value Range Status   MRSA by PCR NEGATIVE  NEGATIVE Final   Comment:            The GeneXpert MRSA Assay (FDA     approved for NASAL specimens     only), is one component of a     comprehensive MRSA colonization     surveillance program. It is not     intended to diagnose MRSA     infection  nor to guide or     monitor treatment for     MRSA infections.     Studies: Dg Chest 2 View  10/20/2013   CLINICAL DATA:  Cough, congestion, fever  EXAM: CHEST  2 VIEW  COMPARISON:  10/13/2013  FINDINGS: Moderate cardiac enlargement. VP shunt tubing projects over the right thorax. There is moderate vascular congestion, which represents a change from the prior study. The perihilar vessels are mildly indistinct.  IMPRESSION: Congestive heart failure with mild pulmonary edema. Vascular congestion is worse when compared to the prior study.   Electronically Signed   By: Skipper Cliche M.D.   On: 10/20/2013 21:02    Scheduled Meds: . amLODipine  10 mg Oral Daily  . calcitRIOL  0.25 mcg Oral Q M,W,F  . cloNIDine  0.3  mg Oral TID  . enoxaparin (LOVENOX) injection  30 mg Subcutaneous QHS  . furosemide  40 mg Intravenous BID  . guaiFENesin  600 mg Oral BID  . hydrALAZINE  100 mg Oral TID  . ipratropium-albuterol  3 mL Nebulization TID  . labetalol  200 mg Oral TID  . sodium bicarbonate  650 mg Oral TID  . sodium chloride  3 mL Intravenous Q12H   Continuous Infusions: . sodium chloride 10 mL/hr (10/21/13 0845)  . nitroGLYCERIN Stopped (10/21/13 1134)    Active Problems:   H/O noncompliance with medical treatment, presenting hazards to health   Tobacco abuse   CAD (coronary artery disease)   Acute-on-chronic renal failure   Accelerated hypertension   Hypertensive urgency    Time spent: 35 minutes    [facility] Pager [phone]. If 7PM-7AM, please contact night-coverage at [email or web address], password [facility] 10/21/2013, 7:22 PM  LOS: 1 day

## 2013-10-22 DIAGNOSIS — R404 Transient alteration of awareness: Secondary | ICD-10-CM

## 2013-10-22 DIAGNOSIS — G40909 Epilepsy, unspecified, not intractable, without status epilepticus: Secondary | ICD-10-CM | POA: Diagnosis present

## 2013-10-22 DIAGNOSIS — I5033 Acute on chronic diastolic (congestive) heart failure: Secondary | ICD-10-CM

## 2013-10-22 LAB — PRO B NATRIURETIC PEPTIDE: Pro B Natriuretic peptide (BNP): 42680 pg/mL — ABNORMAL HIGH (ref 0–125)

## 2013-10-22 LAB — COMPREHENSIVE METABOLIC PANEL
ALT: 59 U/L — ABNORMAL HIGH (ref 0–35)
AST: 20 U/L (ref 0–37)
Albumin: 2.5 g/dL — ABNORMAL LOW (ref 3.5–5.2)
Alkaline Phosphatase: 77 U/L (ref 39–117)
BUN: 38 mg/dL — ABNORMAL HIGH (ref 6–23)
CALCIUM: 8.9 mg/dL (ref 8.4–10.5)
CO2: 22 meq/L (ref 19–32)
CREATININE: 3.33 mg/dL — AB (ref 0.50–1.10)
Chloride: 103 mEq/L (ref 96–112)
GFR calc Af Amer: 17 mL/min — ABNORMAL LOW (ref >90)
GFR, EST NON AFRICAN AMERICAN: 15 mL/min — AB (ref >90)
Glucose, Bld: 97 mg/dL (ref 70–99)
Potassium: 4.1 mEq/L (ref 3.7–5.3)
Sodium: 139 mEq/L (ref 137–147)
TOTAL PROTEIN: 5.7 g/dL — AB (ref 6.0–8.3)
Total Bilirubin: 0.5 mg/dL (ref 0.3–1.2)

## 2013-10-22 LAB — CBC
HCT: 34.1 % — ABNORMAL LOW (ref 36.0–46.0)
Hemoglobin: 11.1 g/dL — ABNORMAL LOW (ref 12.0–15.0)
MCH: 28.5 pg (ref 26.0–34.0)
MCHC: 32.6 g/dL (ref 30.0–36.0)
MCV: 87.4 fL (ref 78.0–100.0)
Platelets: 254 10*3/uL (ref 150–400)
RBC: 3.9 MIL/uL (ref 3.87–5.11)
RDW: 16.2 % — ABNORMAL HIGH (ref 11.5–15.5)
WBC: 4.9 10*3/uL (ref 4.0–10.5)

## 2013-10-22 MED ORDER — FUROSEMIDE 80 MG PO TABS
80.0000 mg | ORAL_TABLET | Freq: Two times a day (BID) | ORAL | Status: DC
  Filled 2013-10-22 (×2): qty 1

## 2013-10-22 MED ORDER — FUROSEMIDE 80 MG PO TABS
80.0000 mg | ORAL_TABLET | Freq: Two times a day (BID) | ORAL | Status: DC
  Administered 2013-10-23: 80 mg via ORAL
  Filled 2013-10-22 (×3): qty 1

## 2013-10-22 NOTE — Progress Notes (Signed)
[facility] PROGRESS NOTE  [name] [id number] DOB: [birth year] DOA: 10/20/2013 PCP: No PCP Per Patient  Assessment/Plan: Active Problems:   H/O noncompliance with medical treatment, presenting hazards to health: Concern about patient's frequent admissions and issues of noncompliance. She tells me that often times, she falls asleep and her daughter does not wake her up for her to take her medications. When she is more awake, we'll discuss this further. She may need psychiatric evaluation.  Somnolence: Has stopped when necessary Benadryl and Robitussin. Not on any Phenergan or narcotics. Nursing reports she slept for most of the day. ABG unrevealing. Checking Keppra level  Seizure disorder: Continued on Keppra.    Tobacco abuse: On nicotine patch    CAD (coronary artery disease): Stable    Acute-on-chronic  stage IV renal failure: Creatinine was down to 3.11. Slightly higher today. Possibly from aggressive diuresis. Change IV to her home dose of by mouth    Accelerated hypertension: See below   Hypertensive urgency: Able to wean off nitroglycerin drip and transfer to floor. Continue home meds plus diuresis. blood pressures much better today.  Acute on chronic diastolic heart failure: Gently diuresing. Echocardiogram done July 2014 noting grade 2 diastolic dysfunction. Patient has diuresed more than 2 L. BNP still elevated, but some component of that is her chronic kidney disease   Code Status: Full code Family Communication: No family present Disposition Plan: Transferred to floor today.  home likely tomorrow.   Consultants:  None  Procedures:  None  Antibiotics:  None  HPI/Subjective:  patient's sleeping. When I woke her, she will interact with me, says that she was still very sleepy. Unclear why  Objective: Filed Vitals:   10/22/13 1506  BP: 133/62  Pulse: 60  Temp: 97.6 F (36.4 C)  Resp: 18    Intake/Output Summary (Last 24 hours) at 10/22/13  1754 Last data filed at 10/22/13 1742  Gross per 24 hour  Intake    390 ml  Output   2350 ml  Net  -1960 ml   Filed Weights   10/20/13 2144 10/21/13 1845  Weight: 64.6 kg (142 lb 6.7 oz) 63.322 kg (139 lb 9.6 oz)    Exam:   General:  Somnolent  Cardiovascular: Regular rhythm, 2/6 systolic ejection murmur  Respiratory: Decreased breath sounds her out, more so at the bases  Abdomen: Soft, nontender, nondistended, positive bowel sounds  Musculoskeletal: No clubbing or cyanosis, trace edema   Data Reviewed: Basic Metabolic Panel:  Recent Labs Lab 10/20/13 1735 10/21/13 0345 10/22/13 0534  NA 137 134* 139  K 4.6 5.2 4.1  CL 101 103 103  CO2 19 17* 22  GLUCOSE 93 100* 97  BUN 39* 41* 38*  CREATININE 3.19* 3.11* 3.33*  CALCIUM 9.3 8.5 8.9  MG  --  1.9  --   PHOS  --  4.3  --    Liver Function Tests:  Recent Labs Lab 10/21/13 0345 10/22/13 0534  AST 51* 20  ALT 67* 59*  ALKPHOS 69 77  BILITOT 0.5 0.5  PROT 5.6* 5.7*  ALBUMIN 2.5* 2.5*   No results found for this basename: LIPASE, AMYLASE,  in the last 168 hours No results found for this basename: AMMONIA,  in the last 168 hours CBC:  Recent Labs Lab 10/20/13 1735 10/21/13 0345 10/22/13 0534  WBC 6.7 6.3 4.9  HGB 12.0 10.7* 11.1*  HCT 36.0 31.9* 34.1*  MCV 86.5 85.3 87.4  PLT 293 242 254   Cardiac Enzymes:  Recent Labs Lab 10/20/13 1735 10/20/13 2255 10/21/13 0345 10/21/13 0926  TROPONINI <0.30 <0.30 <0.30 <0.30   BNP (last 3 results)  Recent Labs  05/07/13 0500 10/13/13 2210 10/22/13 0534  PROBNP 20527.0* >70000.0* 42680.0*   CBG: No results found for this basename: GLUCAP,  in the last 168 hours  Recent Results (from the past 240 hour(s))  MRSA PCR SCREENING     Status: None   Collection Time    10/20/13  9:45 PM      Result Value Range Status   MRSA by PCR NEGATIVE  NEGATIVE Final   Comment:            The GeneXpert MRSA Assay (FDA     approved for NASAL specimens      only), is one component of a     comprehensive MRSA colonization     surveillance program. It is not     intended to diagnose MRSA     infection nor to guide or     monitor treatment for     MRSA infections.     Studies: Dg Chest 2 View  10/20/2013   CLINICAL DATA:  Cough, congestion, fever  EXAM: CHEST  2 VIEW  COMPARISON:  10/13/2013  FINDINGS: Moderate cardiac enlargement. VP shunt tubing projects over the right thorax. There is moderate vascular congestion, which represents a change from the prior study. The perihilar vessels are mildly indistinct.  IMPRESSION: Congestive heart failure with mild pulmonary edema. Vascular congestion is worse when compared to the prior study.   Electronically Signed   By: Skipper Cliche M.D.   On: 10/20/2013 21:02    Scheduled Meds: . amLODipine  10 mg Oral Daily  . atorvastatin  80 mg Oral Daily  . calcitRIOL  0.25 mcg Oral Q M,W,F  . cloNIDine  0.3 mg Oral TID  . enoxaparin (LOVENOX) injection  30 mg Subcutaneous QHS  . furosemide  40 mg Intravenous BID  . guaiFENesin  600 mg Oral BID  . hydrALAZINE  100 mg Oral TID  . ipratropium-albuterol  3 mL Nebulization TID  . labetalol  200 mg Oral TID  . levETIRAcetam  500 mg Oral BID  . sodium bicarbonate  650 mg Oral TID  . sodium chloride  3 mL Intravenous Q12H  . tamsulosin  0.4 mg Oral Daily   Continuous Infusions: . sodium chloride 10 mL/hr (10/21/13 0845)  . nitroGLYCERIN Stopped (10/21/13 1134)    Active Problems:   H/O noncompliance with medical treatment, presenting hazards to health   Tobacco abuse   CAD (coronary artery disease)   Acute-on-chronic renal failure   Accelerated hypertension   Hypertensive urgency    Time spent: 35 minutes    [facility] Pager [phone]. If 7PM-7AM, please contact night-coverage at [email or web address], password [email or web address] 10/22/2013, 5:54 PM  LOS: 2 days

## 2013-10-23 DIAGNOSIS — Z91199 Patient's noncompliance with other medical treatment and regimen due to unspecified reason: Secondary | ICD-10-CM

## 2013-10-23 DIAGNOSIS — N184 Chronic kidney disease, stage 4 (severe): Secondary | ICD-10-CM

## 2013-10-23 DIAGNOSIS — I2510 Atherosclerotic heart disease of native coronary artery without angina pectoris: Secondary | ICD-10-CM

## 2013-10-23 DIAGNOSIS — N289 Disorder of kidney and ureter, unspecified: Secondary | ICD-10-CM

## 2013-10-23 DIAGNOSIS — I119 Hypertensive heart disease without heart failure: Secondary | ICD-10-CM

## 2013-10-23 DIAGNOSIS — Z9119 Patient's noncompliance with other medical treatment and regimen: Secondary | ICD-10-CM

## 2013-10-23 LAB — BASIC METABOLIC PANEL
BUN: 36 mg/dL — ABNORMAL HIGH (ref 6–23)
CALCIUM: 8.9 mg/dL (ref 8.4–10.5)
CHLORIDE: 100 meq/L (ref 96–112)
CO2: 25 mEq/L (ref 19–32)
Creatinine, Ser: 3.31 mg/dL — ABNORMAL HIGH (ref 0.50–1.10)
GFR calc non Af Amer: 15 mL/min — ABNORMAL LOW (ref >90)
GFR, EST AFRICAN AMERICAN: 17 mL/min — AB (ref >90)
Glucose, Bld: 107 mg/dL — ABNORMAL HIGH (ref 70–99)
Potassium: 4.2 mEq/L (ref 3.7–5.3)
Sodium: 140 mEq/L (ref 137–147)

## 2013-10-23 LAB — CBC
HEMATOCRIT: 34.4 % — AB (ref 36.0–46.0)
Hemoglobin: 11.4 g/dL — ABNORMAL LOW (ref 12.0–15.0)
MCH: 28.9 pg (ref 26.0–34.0)
MCHC: 33.1 g/dL (ref 30.0–36.0)
MCV: 87.3 fL (ref 78.0–100.0)
PLATELETS: 206 10*3/uL (ref 150–400)
RBC: 3.94 MIL/uL (ref 3.87–5.11)
RDW: 16.2 % — ABNORMAL HIGH (ref 11.5–15.5)
WBC: 4.5 10*3/uL (ref 4.0–10.5)

## 2013-10-23 MED ORDER — CLONIDINE HCL 0.3 MG PO TABS: 0.3000 mg | ORAL_TABLET | Freq: Three times a day (TID) | ORAL | Status: DC

## 2013-10-23 NOTE — Progress Notes (Signed)
Attempted to reach daughters multiple times at the numbers listed in the chart.  All numbers are disconnected or no answer.  Was able to reach sister, named Gus, and explained that patient was discharged and needed someone to pick her up.  Gus said she would call someone local and have them call me.  No call x1 hour.  Will notify MD. Andre Lefort

## 2013-10-23 NOTE — Discharge Summary (Signed)
Physician Discharge Summary  [name] [id number] DOB: October 06, 1961 DOA: 10/20/2013  PCP: No PCP Per Patient  Admit date: 10/20/2013 Discharge date: 10/23/2013  Time spent: > 35 minutes  Recommendations for Outpatient Follow-up:  1. Please try to avoid sedative medication (benzodiazepines or antihistamines).  2. Patient reports taking xanax at home.  I have recommended that she discontinue any benzodiazepines.  No benzodiazepines listed on home regimen. 3. Recommend workup for secondary causes of hypertension if not already done 4. Continue to encourage tobacco cessation  Discharge Diagnoses:  Active Problems:   H/O noncompliance with medical treatment, presenting hazards to health   Tobacco abuse   CAD (coronary artery disease)   Acute-on-chronic renal failure   Accelerated hypertension   Hypertensive urgency   Seizure disorder   Somnolence   Discharge Condition: Stable  Diet recommendation: Renal diet  Filed Weights   10/20/13 2144 10/21/13 1845 10/23/13 0455  Weight: 64.6 kg (142 lb 6.7 oz) 63.322 kg (139 lb 9.6 oz) 59.2 kg (130 lb 8.2 oz)    History of present illness:  53 y/o [place] American female with history of hypertension, hypercholesterolemia, tobacco abuse air disease, CKT, intracranial hemorrhage, diastolic heart failure, subarachnoid hemorrhage, aneurysm. Who presented to the ED complaining of feeling bad while in the emergency department patient had elevated blood pressures and reportedly has history of medical noncompliance and prior admissions to the hospital secondary to hypertensive urgency.  Hospital Course: Principal problem: Accelerated hypertension/Hypertensive urgency -Will discharge home on amlodipine, clonidine, hydralazine, labetalol and Lasix. - Considering another agent in the future would consider spironolactone. Should recommend consideration for workup for secondary causes of hypertension.  Active Problems:   H/O noncompliance with medical  treatment, presenting hazards to health - Discussed adherence to recommended medical regimen. Patient agrees. Have also recommended that patient avoid any medications that may make her somnolent like Xanax    Tobacco abuse - Recommend cessation    CAD (coronary artery disease) - Stable continue statin    Acute-on-chronic renal failure - Improving on home regimen of Lasix. Ovoid ARB and ACE inhibitor    Accelerated hypertension - As mentioned above    Seizure disorder - Continue home regimen    Somnolence - Recommend waiting benzodiazepines and antihistamines patient to followup with neurologist  Procedures:  None  Consultations:  None  Discharge Exam: Filed Vitals:   10/23/13 0455  BP: 152/80  Pulse: 53  Temp:   Resp: 20    General: No new complaints. No acute issues reported overnight. Cardiovascular: RRR, no MRG Respiratory: CTA BL, no wheezes  Discharge Instructions  Discharge Orders   Future Orders Complete By Expires   Call MD for:  difficulty breathing, headache or visual disturbances  As directed    Call MD for:  temperature >100.4  As directed    Diet - low sodium heart healthy  As directed    Increase activity slowly  As directed        Medication List    STOP taking these medications       isosorbide mononitrate 30 MG 24 hr tablet  Commonly known as:  IMDUR     losartan 50 MG tablet  Commonly known as:  COZAAR     metoprolol 100 MG tablet  Commonly known as:  LOPRESSOR      TAKE these medications       amLODipine 10 MG tablet  Commonly known as:  NORVASC  Take 1 tablet (10 mg total) by mouth daily.  atorvastatin 80 MG tablet  Commonly known as:  LIPITOR  Take 80 mg by mouth daily.     calcitRIOL 0.25 MCG capsule  Commonly known as:  ROCALTROL  Take 1 capsule (0.25 mcg total) by mouth every Monday, Wednesday, and Friday.     camphor-menthol lotion  Commonly known as:  SARNA  Apply topically as needed for itching.      cloNIDine 0.3 MG tablet  Commonly known as:  CATAPRES  Take 1 tablet (0.3 mg total) by mouth 3 (three) times daily.     furosemide 80 MG tablet  Commonly known as:  LASIX  Take 1 tablet (80 mg total) by mouth 2 (two) times daily.     hydrALAZINE 100 MG tablet  Commonly known as:  APRESOLINE  Take 1 tablet (100 mg total) by mouth 3 (three) times daily.     labetalol 200 MG tablet  Commonly known as:  NORMODYNE  Take 1 tablet (200 mg total) by mouth 3 (three) times daily.     levETIRAcetam 500 MG tablet  Commonly known as:  KEPPRA  Take 500 mg by mouth 2 (two) times daily.     nitroGLYCERIN 0.4 MG SL tablet  Commonly known as:  NITROSTAT  Place 1 tablet (0.4 mg total) under the tongue every 5 (five) minutes as needed for chest pain.     omeprazole 20 MG capsule  Commonly known as:  PRILOSEC  Take 20 mg by mouth daily.     sodium bicarbonate 650 MG tablet  Take 1 tablet (650 mg total) by mouth 3 (three) times daily.     tamsulosin 0.4 MG Caps capsule  Commonly known as:  FLOMAX  Take 0.4 mg by mouth daily.       Allergies  Allergen Reactions  . Ampicillin Hives and Rash  . Penicillins Hives and Rash      The results of significant diagnostics from this hospitalization (including imaging, microbiology, ancillary and laboratory) are listed below for reference.    Significant Diagnostic Studies: Dg Chest 2 View  10/20/2013   CLINICAL DATA:  Cough, congestion, fever  EXAM: CHEST  2 VIEW  COMPARISON:  10/13/2013  FINDINGS: Moderate cardiac enlargement. VP shunt tubing projects over the right thorax. There is moderate vascular congestion, which represents a change from the prior study. The perihilar vessels are mildly indistinct.  IMPRESSION: Congestive heart failure with mild pulmonary edema. Vascular congestion is worse when compared to the prior study.   Electronically Signed   By: Skipper Cliche M.D.   On: 10/20/2013 21:02   Ct Head Wo Contrast  09/25/2013   CLINICAL  DATA:  Nausea and vomiting. Right eye pain and visual disturbance. Ventriculoperitoneal shunt.  EXAM: CT HEAD WITHOUT CONTRAST  TECHNIQUE: Contiguous axial images were obtained from the base of the skull through the vertex without intravenous contrast.  COMPARISON:  03/24/2013.  FINDINGS: The right frontal ventriculoperitoneal shunt is unchanged. Normal sized ventricles with an interval decrease in size. Stable streak artifacts from aneurysm clips and coils at the skull base. Stable post craniotomy changes on the right. No intracranial hemorrhage, mass lesion or CT evidence of acute infarction. 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 Stable mild patchy white matter low density in both cerebral hemispheres.  IMPRESSION: 1. Stable right ventriculoperitoneal shunt tube with an interval decrease in size of the ventricles. 2. Interval visualization of an old lacunar infarct at the inferior aspect of the left  caudate head. 3. Stable mild chronic small vessel white matter ischemic changes in both cerebral hemispheres. 4. No acute abnormality.   Electronically Signed   By: Enrique Sack M.D.   On: 09/25/2013 17:06   US Abdomen Complete  09/25/2013   CLINICAL DATA:  Abdominal pain, nausea/vomiting  EXAM: ULTRASOUND ABDOMEN COMPLETE  COMPARISON:  None.  FINDINGS: Gallbladder:  Underdistended. Mild gallbladder wall thickening, measuring 7-8 mm. No gallstones or pericholecystic fluid. Negative sonographic Murphy's sign.  Common bile duct:  Diameter: 3 mm.  Liver:  No focal lesion identified. Within normal limits in parenchymal echogenicity.  IVC:  No abnormality visualized.  Pancreas:  Visualized portions are unremarkable.  Spleen:  Measures 3.8 cm  Right Kidney:  Length: 10.2 cm. Increased parenchymal echogenicity, suggesting medical renal disease. No hydronephrosis.  Left Kidney:  Length: 9.7 cm. Increased parenchymal echogenicity, suggesting medical renal disease.  Two lower pole cysts measuring up to 1.2 x 1.6 x 1.4 cm. No hydronephrosis.  Abdominal aorta:  No aneurysm visualized.  Other findings:  Perihepatic ascites.  Bilateral pleural effusions.  IMPRESSION: Mild gallbladder wall thickening, nonspecific. No associated sonographic findings to suggest acute cholecystitis.  Perihepatic ascites.  Bilateral pleural effusions.   Electronically Signed   By: Julian Hy M.D.   On: 09/25/2013 14:40   Dg Chest Portable 1 View  10/13/2013   CLINICAL DATA:  Short of breath.  EXAM: PORTABLE CHEST - 1 VIEW  COMPARISON:  09/25/2013.  FINDINGS: Cardiomegaly. VP shunt tubing projects over the right chest. Interstitial pulmonary edema is present. Pulmonary vascular congestion. Monitoring leads project over the chest. No focal consolidation. No definite effusion on this single frontal view.  IMPRESSION: Mild CHF.   Electronically Signed   By: Dereck Ligas M.D.   On: 10/13/2013 22:44   Dg Chest Port 1 View  09/25/2013   CLINICAL DATA:  Chest pain, shortness of Breath  EXAM: PORTABLE CHEST - 1 VIEW  COMPARISON:  05/07/2013  FINDINGS: Cardiomegaly again noted. Right VP shunt catheter. No acute infiltrate or pleural effusion. No pulmonary edema. Mild left basilar atelectasis.  IMPRESSION: No acute infiltrate or pulmonary edema. Mild left basilar atelectasis.   Electronically Signed   By: Lahoma Crocker M.D.   On: 09/25/2013 11:22    Microbiology: Recent Results (from the past 240 hour(s))  MRSA PCR SCREENING     Status: None   Collection Time    10/20/13  9:45 PM      Result Value Range Status   MRSA by PCR NEGATIVE  NEGATIVE Final   Comment:            The GeneXpert MRSA Assay (FDA     approved for NASAL specimens     only), is one component of a     comprehensive MRSA colonization     surveillance program. It is not     intended to diagnose MRSA     infection nor to guide or     monitor treatment for     MRSA infections.     Labs: Basic Metabolic  Panel:  Recent Labs Lab 10/20/13 1735 10/21/13 0345 10/22/13 0534 10/23/13 0515  NA 137 134* 139 140  K 4.6 5.2 4.1 4.2  CL 101 103 103 100  CO2 19 17* 22 25  GLUCOSE 93 100* 97 107*  BUN 39* 41* 38* 36*  CREATININE 3.19* 3.11* 3.33* 3.31*  CALCIUM 9.3 8.5 8.9 8.9  MG  --  1.9  --   --  PHOS  --  4.3  --   --    Liver Function Tests:  Recent Labs Lab 10/21/13 0345 10/22/13 0534  AST 51* 20  ALT 67* 59*  ALKPHOS 69 77  BILITOT 0.5 0.5  PROT 5.6* 5.7*  ALBUMIN 2.5* 2.5*   No results found for this basename: LIPASE, AMYLASE,  in the last 168 hours No results found for this basename: AMMONIA,  in the last 168 hours CBC:  Recent Labs Lab 10/20/13 1735 10/21/13 0345 10/22/13 0534 10/23/13 0515  WBC 6.7 6.3 4.9 4.5  HGB 12.0 10.7* 11.1* 11.4*  HCT 36.0 31.9* 34.1* 34.4*  MCV 86.5 85.3 87.4 87.3  PLT 293 242 254 206   Cardiac Enzymes:  Recent Labs Lab 10/20/13 1735 10/20/13 2255 10/21/13 0345 10/21/13 0926  TROPONINI <0.30 <0.30 <0.30 <0.30   BNP: BNP (last 3 results)  Recent Labs  05/07/13 0500 10/13/13 2210 10/22/13 0534  PROBNP 20527.0* >70000.0* 42680.0*   CBG: No results found for this basename: GLUCAP,  in the last 168 hours     Signed:  Velvet Bathe  [facility] 10/23/2013, 4:57 PM

## 2013-10-24 LAB — BLOOD GAS, ARTERIAL
Acid-Base Excess: 1.6 mmol/L (ref 0.0–2.0)
Bicarbonate: 24.5 mEq/L — ABNORMAL HIGH (ref 20.0–24.0)
Drawn by: 310571
FIO2: 0.21 %
O2 Saturation: 95.3 %
PCO2 ART: 33.9 mmHg — AB (ref 35.0–45.0)
PH ART: 7.472 — AB (ref 7.350–7.450)
PO2 ART: 75.9 mmHg — AB (ref 80.0–100.0)
Patient temperature: 98.6
TCO2: 21.9 mmol/L (ref 0–100)

## 2013-10-25 LAB — LEVETIRACETAM LEVEL: LEVETIRACETAM: 23 ug/mL (ref 5.0–30.0)

## 2013-11-03 ENCOUNTER — Inpatient Hospital Stay (HOSPITAL_COMMUNITY)
Admission: EM | Admit: 2013-11-03 | Discharge: 2013-11-07 | DRG: 291 | Disposition: A | Discharge status: Home / Self Care | Payer: Medicare Other | Attending: Internal Medicine | Admitting: Internal Medicine

## 2013-11-03 ENCOUNTER — Emergency Department (HOSPITAL_COMMUNITY): Payer: Medicare Other

## 2013-11-03 ENCOUNTER — Encounter (HOSPITAL_COMMUNITY): Payer: Self-pay | Admitting: Emergency Medicine

## 2013-11-03 DIAGNOSIS — F17200 Nicotine dependence, unspecified, uncomplicated: Secondary | ICD-10-CM | POA: Diagnosis present

## 2013-11-03 DIAGNOSIS — N039 Chronic nephritic syndrome with unspecified morphologic changes: Principal | ICD-10-CM

## 2013-11-03 DIAGNOSIS — I5033 Acute on chronic diastolic (congestive) heart failure: Secondary | ICD-10-CM | POA: Diagnosis present

## 2013-11-03 DIAGNOSIS — I160 Hypertensive urgency: Secondary | ICD-10-CM | POA: Diagnosis present

## 2013-11-03 DIAGNOSIS — Z8249 Family history of ischemic heart disease and other diseases of the circulatory system: Secondary | ICD-10-CM

## 2013-11-03 DIAGNOSIS — N390 Urinary tract infection, site not specified: Secondary | ICD-10-CM | POA: Diagnosis present

## 2013-11-03 DIAGNOSIS — I129 Hypertensive chronic kidney disease with stage 1 through stage 4 chronic kidney disease, or unspecified chronic kidney disease: Secondary | ICD-10-CM | POA: Diagnosis present

## 2013-11-03 DIAGNOSIS — N189 Chronic kidney disease, unspecified: Secondary | ICD-10-CM

## 2013-11-03 DIAGNOSIS — I119 Hypertensive heart disease without heart failure: Secondary | ICD-10-CM | POA: Diagnosis present

## 2013-11-03 DIAGNOSIS — Z720 Tobacco use: Secondary | ICD-10-CM

## 2013-11-03 DIAGNOSIS — Z982 Presence of cerebrospinal fluid drainage device: Secondary | ICD-10-CM

## 2013-11-03 DIAGNOSIS — I509 Heart failure, unspecified: Secondary | ICD-10-CM | POA: Diagnosis present

## 2013-11-03 DIAGNOSIS — N184 Chronic kidney disease, stage 4 (severe): Secondary | ICD-10-CM | POA: Diagnosis present

## 2013-11-03 DIAGNOSIS — Z79899 Other long term (current) drug therapy: Secondary | ICD-10-CM

## 2013-11-03 DIAGNOSIS — I10 Essential (primary) hypertension: Secondary | ICD-10-CM | POA: Diagnosis present

## 2013-11-03 DIAGNOSIS — Z9119 Patient's noncompliance with other medical treatment and regimen: Secondary | ICD-10-CM

## 2013-11-03 DIAGNOSIS — E43 Unspecified severe protein-calorie malnutrition: Secondary | ICD-10-CM | POA: Insufficient documentation

## 2013-11-03 DIAGNOSIS — Z9114 Patient's other noncompliance with medication regimen: Secondary | ICD-10-CM

## 2013-11-03 DIAGNOSIS — IMO0002 Reserved for concepts with insufficient information to code with codable children: Secondary | ICD-10-CM

## 2013-11-03 DIAGNOSIS — G40909 Epilepsy, unspecified, not intractable, without status epilepticus: Secondary | ICD-10-CM | POA: Diagnosis present

## 2013-11-03 DIAGNOSIS — I2510 Atherosclerotic heart disease of native coronary artery without angina pectoris: Secondary | ICD-10-CM

## 2013-11-03 DIAGNOSIS — Z91199 Patient's noncompliance with other medical treatment and regimen due to unspecified reason: Secondary | ICD-10-CM

## 2013-11-03 DIAGNOSIS — Z880 Allergy status to penicillin: Secondary | ICD-10-CM

## 2013-11-03 DIAGNOSIS — I130 Hypertensive heart and chronic kidney disease with heart failure and stage 1 through stage 4 chronic kidney disease, or unspecified chronic kidney disease: Principal | ICD-10-CM | POA: Diagnosis present

## 2013-11-03 DIAGNOSIS — E7800 Pure hypercholesterolemia, unspecified: Secondary | ICD-10-CM | POA: Diagnosis present

## 2013-11-03 DIAGNOSIS — N179 Acute kidney failure, unspecified: Secondary | ICD-10-CM | POA: Diagnosis present

## 2013-11-03 DIAGNOSIS — J40 Bronchitis, not specified as acute or chronic: Secondary | ICD-10-CM | POA: Diagnosis present

## 2013-11-03 DIAGNOSIS — I252 Old myocardial infarction: Secondary | ICD-10-CM

## 2013-11-03 DIAGNOSIS — N186 End stage renal disease: Secondary | ICD-10-CM | POA: Diagnosis present

## 2013-11-03 HISTORY — DX: Atherosclerotic heart disease of native coronary artery without angina pectoris: I25.10

## 2013-11-03 LAB — URINALYSIS, ROUTINE W REFLEX MICROSCOPIC
Bilirubin Urine: NEGATIVE
Glucose, UA: NEGATIVE mg/dL
Ketones, ur: NEGATIVE mg/dL
NITRITE: NEGATIVE
Protein, ur: >300 mg/dL — AB
SPECIFIC GRAVITY, URINE: 1.018 (ref 1.005–1.030)
Urobilinogen, UA: 0.2 mg/dL (ref 0.0–1.0)
pH: 5 (ref 5.0–8.0)

## 2013-11-03 LAB — COMPREHENSIVE METABOLIC PANEL
ALT: 21 U/L (ref 0–35)
AST: 14 U/L (ref 0–37)
Albumin: 3.1 g/dL — ABNORMAL LOW (ref 3.5–5.2)
Alkaline Phosphatase: 88 U/L (ref 39–117)
BUN: 36 mg/dL — ABNORMAL HIGH (ref 6–23)
CO2: 18 mEq/L — ABNORMAL LOW (ref 19–32)
Calcium: 9.2 mg/dL (ref 8.4–10.5)
Chloride: 100 mEq/L (ref 96–112)
Creatinine, Ser: 3.38 mg/dL — ABNORMAL HIGH (ref 0.50–1.10)
GFR calc non Af Amer: 15 mL/min — ABNORMAL LOW (ref >90)
GFR, EST AFRICAN AMERICAN: 17 mL/min — AB (ref >90)
GLUCOSE: 102 mg/dL — AB (ref 70–99)
POTASSIUM: 4.1 meq/L (ref 3.7–5.3)
SODIUM: 134 meq/L — AB (ref 137–147)
TOTAL PROTEIN: 6.8 g/dL (ref 6.0–8.3)
Total Bilirubin: 0.5 mg/dL (ref 0.3–1.2)

## 2013-11-03 LAB — CBC WITH DIFFERENTIAL/PLATELET
BASOS PCT: 1 % (ref 0–1)
Basophils Absolute: 0 10*3/uL (ref 0.0–0.1)
Eosinophils Absolute: 0.1 10*3/uL (ref 0.0–0.7)
Eosinophils Relative: 1 % (ref 0–5)
HCT: 39.5 % (ref 36.0–46.0)
Hemoglobin: 13.3 g/dL (ref 12.0–15.0)
Lymphocytes Relative: 38 % (ref 12–46)
Lymphs Abs: 2.3 10*3/uL (ref 0.7–4.0)
MCH: 28.5 pg (ref 26.0–34.0)
MCHC: 33.7 g/dL (ref 30.0–36.0)
MCV: 84.8 fL (ref 78.0–100.0)
Monocytes Absolute: 0.4 10*3/uL (ref 0.1–1.0)
Monocytes Relative: 6 % (ref 3–12)
Neutro Abs: 3.3 10*3/uL (ref 1.7–7.7)
Neutrophils Relative %: 54 % (ref 43–77)
Platelets: 281 10*3/uL (ref 150–400)
RBC: 4.66 MIL/uL (ref 3.87–5.11)
RDW: 16.2 % — ABNORMAL HIGH (ref 11.5–15.5)
WBC: 6.1 10*3/uL (ref 4.0–10.5)

## 2013-11-03 LAB — URINE MICROSCOPIC-ADD ON

## 2013-11-03 LAB — TROPONIN I: Troponin I: <0.3 ng/mL (ref <0.30)

## 2013-11-03 LAB — MRSA PCR SCREENING: MRSA BY PCR: NEGATIVE

## 2013-11-03 MED ORDER — CLONIDINE HCL 0.3 MG PO TABS
0.3000 mg | ORAL_TABLET | Freq: Three times a day (TID) | ORAL | Status: DC
  Administered 2013-11-03 – 2013-11-07 (×12): 0.3 mg via ORAL
  Filled 2013-11-03 (×7): qty 1
  Filled 2013-11-03: qty 3
  Filled 2013-11-03 (×8): qty 1

## 2013-11-03 MED ORDER — HEPARIN SODIUM (PORCINE) 5000 UNIT/ML IJ SOLN
5000.0000 [IU] | Freq: Three times a day (TID) | INTRAMUSCULAR | Status: DC
  Administered 2013-11-03 – 2013-11-07 (×7): 5000 [IU] via SUBCUTANEOUS
  Filled 2013-11-03 (×15): qty 1

## 2013-11-03 MED ORDER — LABETALOL HCL 200 MG PO TABS
200.0000 mg | ORAL_TABLET | Freq: Three times a day (TID) | ORAL | Status: DC
  Administered 2013-11-03 – 2013-11-04 (×5): 200 mg via ORAL
  Filled 2013-11-03 (×9): qty 1

## 2013-11-03 MED ORDER — SODIUM BICARBONATE 650 MG PO TABS
650.0000 mg | ORAL_TABLET | Freq: Three times a day (TID) | ORAL | Status: DC
  Administered 2013-11-03 – 2013-11-07 (×12): 650 mg via ORAL
  Filled 2013-11-03 (×14): qty 1

## 2013-11-03 MED ORDER — FUROSEMIDE 80 MG PO TABS
80.0000 mg | ORAL_TABLET | Freq: Two times a day (BID) | ORAL | Status: DC
  Administered 2013-11-04 – 2013-11-06 (×6): 80 mg via ORAL
  Filled 2013-11-03 (×10): qty 1

## 2013-11-03 MED ORDER — LEVOFLOXACIN IN D5W 500 MG/100ML IV SOLN
500.0000 mg | INTRAVENOUS | Status: DC
  Administered 2013-11-05: 500 mg via INTRAVENOUS
  Filled 2013-11-03: qty 100

## 2013-11-03 MED ORDER — SODIUM CHLORIDE 0.9 % IV BOLUS (SEPSIS)
500.0000 mL | Freq: Once | INTRAVENOUS | Status: AC
  Administered 2013-11-03: 500 mL via INTRAVENOUS

## 2013-11-03 MED ORDER — ONDANSETRON HCL 4 MG/2ML IJ SOLN: 4.0000 mg | Freq: Three times a day (TID) | INTRAMUSCULAR | Status: AC | PRN

## 2013-11-03 MED ORDER — ISOSORBIDE MONONITRATE ER 60 MG PO TB24
60.0000 mg | ORAL_TABLET | Freq: Every day | ORAL | Status: DC
  Administered 2013-11-04 – 2013-11-07 (×4): 60 mg via ORAL
  Filled 2013-11-03 (×5): qty 1

## 2013-11-03 MED ORDER — SODIUM CHLORIDE 0.9 % IJ SOLN
3.0000 mL | Freq: Two times a day (BID) | INTRAMUSCULAR | Status: DC
  Administered 2013-11-03 – 2013-11-06 (×6): 3 mL via INTRAVENOUS

## 2013-11-03 MED ORDER — INFLUENZA VAC SPLIT QUAD 0.5 ML IM SUSP
0.5000 mL | INTRAMUSCULAR | Status: AC
  Administered 2013-11-05: 0.5 mL via INTRAMUSCULAR
  Filled 2013-11-03 (×3): qty 0.5

## 2013-11-03 MED ORDER — HYDRALAZINE HCL 50 MG PO TABS
100.0000 mg | ORAL_TABLET | Freq: Three times a day (TID) | ORAL | Status: DC
  Administered 2013-11-04 – 2013-11-05 (×4): 100 mg via ORAL
  Filled 2013-11-03 (×7): qty 2

## 2013-11-03 MED ORDER — NITROGLYCERIN IN D5W 200-5 MCG/ML-% IV SOLN
2.0000 ug/min | INTRAVENOUS | Status: DC
Start: 2013-11-03 — End: 2013-11-07
  Administered 2013-11-03: 2 ug/min via INTRAVENOUS
  Administered 2013-11-04: 120 ug/min via INTRAVENOUS
  Administered 2013-11-04: 60 ug/min via INTRAVENOUS
  Administered 2013-11-05: 90 ug/min via INTRAVENOUS
  Filled 2013-11-03 (×5): qty 250

## 2013-11-03 MED ORDER — LEVOFLOXACIN IN D5W 750 MG/150ML IV SOLN
750.0000 mg | Freq: Once | INTRAVENOUS | Status: AC
  Administered 2013-11-03: 750 mg via INTRAVENOUS
  Filled 2013-11-03: qty 150

## 2013-11-03 MED ORDER — METOPROLOL TARTRATE 100 MG PO TABS
100.0000 mg | ORAL_TABLET | Freq: Two times a day (BID) | ORAL | Status: DC
  Administered 2013-11-04 (×2): 100 mg via ORAL
  Filled 2013-11-03 (×5): qty 1

## 2013-11-03 MED ORDER — LABETALOL HCL 5 MG/ML IV SOLN
20.0000 mg | Freq: Once | INTRAVENOUS | Status: AC
  Administered 2013-11-03: 20 mg via INTRAVENOUS
  Filled 2013-11-03: qty 4

## 2013-11-03 MED ORDER — AMLODIPINE BESYLATE 10 MG PO TABS
10.0000 mg | ORAL_TABLET | Freq: Every day | ORAL | Status: DC
  Administered 2013-11-03 – 2013-11-07 (×5): 10 mg via ORAL
  Filled 2013-11-03 (×6): qty 1

## 2013-11-03 MED ORDER — ONDANSETRON HCL 4 MG/2ML IJ SOLN
4.0000 mg | Freq: Once | INTRAMUSCULAR | Status: AC
  Administered 2013-11-03: 4 mg via INTRAVENOUS
  Filled 2013-11-03: qty 2

## 2013-11-03 MED ORDER — ATORVASTATIN CALCIUM 80 MG PO TABS
80.0000 mg | ORAL_TABLET | Freq: Every day | ORAL | Status: DC
  Administered 2013-11-03 – 2013-11-07 (×5): 80 mg via ORAL
  Filled 2013-11-03 (×5): qty 1

## 2013-11-03 MED ORDER — HYDRALAZINE HCL 50 MG PO TABS
100.0000 mg | ORAL_TABLET | Freq: Three times a day (TID) | ORAL | Status: DC
  Filled 2013-11-03 (×2): qty 2

## 2013-11-03 MED ORDER — LEVETIRACETAM 500 MG PO TABS
500.0000 mg | ORAL_TABLET | Freq: Two times a day (BID) | ORAL | Status: DC
  Administered 2013-11-03 – 2013-11-07 (×8): 500 mg via ORAL
  Filled 2013-11-03 (×9): qty 1

## 2013-11-03 NOTE — ED Provider Notes (Signed)
CSN: [id number]     Arrival date & time 11/03/13  0703 History   First MD Initiated Contact with Patient 11/03/13 [phone]     Chief Complaint  Patient presents with  . Nausea  . Cough   (Consider location/radiation/quality/duration/timing/severity/associated sxs/prior Treatment) Patient is a 53 y.o. female presenting with cough. The history is provided by the patient and the EMS personnel. No language interpreter was used.  Cough Cough characteristics:  Productive Sputum characteristics:  Clear Severity:  Moderate Duration:  1 week Timing:  Constant Progression:  Worsening Chronicity:  Recurrent Smoker: yes   Relieved by:  Nothing Worsened by:  Nothing tried Ineffective treatments:  None tried Associated symptoms: chest pain, chills, fever and shortness of breath   Associated symptoms: no diaphoresis, no eye discharge, no headaches, no rash, no rhinorrhea and no sore throat   Associated symptoms comment:  Nausea, vomiting Shortness of breath:    Severity:  Moderate   Onset quality:  Unable to specify   Duration:  1 week   Timing:  Constant   Progression:  Waxing and waning   Past Medical History  Diagnosis Date  . HTN (hypertension)     resistant  . Hypercholesterolemia   . Tobacco abuse     resumed smoking half a pack a day. She was a smoker in the past and states she was abl eto stop in the past using nicotine patches  . CAD (coronary artery disease)     Pt has St elevation MI in Feb 2009. Left hear tcath at tha ttime showed a 70% distal LAD lesion that was hazy consistent with a plaque rupture. She had a 50% distal circumflex stenosis an a 95% distal RCA stenosis. She did havve drug eluting stent placed in her distal RCA  . Chronic kidney disease     most recent creatinine 1.4 in 3/10  . Intracranial hemorrhage, spontaneous intraparenchymal, idiopathic, remote, resolved   . Diastolic heart failure     pt most recent echo was in Feb 2009 w an EF of 60% and severe left  ventricular hypertrophy. The pt did have evidence of diastolic dysfunction. The RV appeared normal  . Subarachnoid hemorrhage     03/2012  . Aneurysm   . CHF (congestive heart failure)     diastolic   Past Surgical History  Procedure Laterality Date  . Ventriculoperitoneal shunt  03/27/2012    Procedure: SHUNT INSERTION VENTRICULAR-PERITONEAL;  Surgeon: Winfield Cunas, MD;  Location: [place] [facility];  Service: Neurosurgery;  Laterality: N/A;  Ventricular-Peritoneal Shunt Insertion  . Cardiac catheterization    . Coronary angioplasty    . Abdominal hysterectomy    . Bascilic vein transposition Right 05/13/2013    Procedure: [place];  Surgeon: Rosetta Posner, MD;  Location: [facility] OR;  Service: Vascular;  Laterality: Right;   Family History  Problem Relation Age of Onset  . Coronary artery disease Other     premature in 1st degree relatives  . Heart disease Mother   . Heart disease Father    History  Substance Use Topics  . Smoking status: Current Every Day Smoker -- 0.20 packs/day for 35 years    Types: Cigarettes  . Smokeless tobacco: Never Used     Comment: 1/2 ppd   . Alcohol Use: No   OB History   Grav Para Term Preterm Abortions TAB SAB Ect Mult Living                 Review  of Systems  Constitutional: Positive for fever and chills. Negative for diaphoresis, activity change, appetite change and fatigue.  HENT: Negative for congestion, facial swelling, rhinorrhea and sore throat.   Eyes: Negative for photophobia and discharge.  Respiratory: Positive for cough and shortness of breath. Negative for chest tightness.   Cardiovascular: Positive for chest pain. Negative for palpitations and leg swelling.  Gastrointestinal: Positive for nausea and vomiting. Negative for abdominal pain and diarrhea.  Endocrine: Negative for polydipsia and polyuria.  Genitourinary: Negative for dysuria, frequency, difficulty urinating and pelvic pain.  Musculoskeletal: Negative for  arthralgias, back pain, neck pain and neck stiffness.  Skin: Negative for color change, rash and wound.  Allergic/Immunologic: Negative for immunocompromised state.  Neurological: Negative for facial asymmetry, weakness, numbness and headaches.  Hematological: Does not bruise/bleed easily.  Psychiatric/Behavioral: Negative for confusion and agitation.    Allergies  Ampicillin and Penicillins  Home Medications   Current Outpatient Rx  Name  Route  Sig  Dispense  Refill  . amLODipine (NORVASC) 10 MG tablet   Oral   Take 1 tablet (10 mg total) by mouth daily.   30 tablet   0   . atorvastatin (LIPITOR) 80 MG tablet   Oral   Take 80 mg by mouth daily.         . cloNIDine (CATAPRES) 0.3 MG tablet   Oral   Take 1 tablet (0.3 mg total) by mouth 3 (three) times daily.   90 tablet   0   . furosemide (LASIX) 80 MG tablet   Oral   Take 1 tablet (80 mg total) by mouth 2 (two) times daily.   60 tablet   0   . hydrALAZINE (APRESOLINE) 100 MG tablet   Oral   Take 1 tablet (100 mg total) by mouth 3 (three) times daily.   90 tablet   0   . isosorbide mononitrate (IMDUR) 30 MG 24 hr tablet   Oral   Take 60 mg by mouth daily.         Marland Kitchen labetalol (NORMODYNE) 200 MG tablet   Oral   Take 1 tablet (200 mg total) by mouth 3 (three) times daily.   90 tablet   0   . levETIRAcetam (KEPPRA) 500 MG tablet   Oral   Take 500 mg by mouth 2 (two) times daily.         Marland Kitchen losartan (COZAAR) 50 MG tablet   Oral   Take 50 mg by mouth 2 (two) times daily.         . metoprolol (LOPRESSOR) 100 MG tablet   Oral   Take 100 mg by mouth 2 (two) times daily.         Marland Kitchen omeprazole (PRILOSEC) 20 MG capsule   Oral   Take 20 mg by mouth daily.         . sodium bicarbonate 650 MG tablet   Oral   Take 1 tablet (650 mg total) by mouth 3 (three) times daily.   90 tablet   1   . tamsulosin (FLOMAX) 0.4 MG CAPS capsule   Oral   Take 0.4 mg by mouth daily.         . calcitRIOL  (ROCALTROL) 0.25 MCG capsule   Oral   Take 1 capsule (0.25 mcg total) by mouth every Monday, Wednesday, and Friday.   30 capsule   0   . camphor-menthol (SARNA) lotion   Topical   Apply 1 application topically as needed for itching.         Marland Kitchen  nitroGLYCERIN (NITROSTAT) 0.4 MG SL tablet   Sublingual   Place 1 tablet (0.4 mg total) under the tongue every 5 (five) minutes as needed for chest pain.   20 tablet   0    BP 213/107  Pulse 74  Temp(Src) 97.5 F (36.4 C) (Oral)  Resp 17  Wt 130 lb (58.968 kg)  SpO2 98% Physical Exam  Constitutional: She is oriented to person, place, and time. She appears well-developed and well-nourished. No distress.  HENT:  Head: Normocephalic and atraumatic.  Mouth/Throat: No oropharyngeal exudate.  Eyes: Pupils are equal, round, and reactive to light.  Neck: Normal range of motion. Neck supple.  Cardiovascular: Regular rhythm and normal heart sounds.  Tachycardia present.  Exam reveals no gallop and no friction rub.   No murmur heard. Pulmonary/Chest: Effort normal and breath sounds normal. No respiratory distress. She has no wheezes. She has no rales.  Abdominal: Soft. Bowel sounds are normal. She exhibits no distension and no mass. There is no tenderness. There is no rebound and no guarding.  Musculoskeletal: Normal range of motion. She exhibits no edema and no tenderness.  Neurological: She is alert and oriented to person, place, and time. She has normal strength. No cranial nerve deficit or sensory deficit. She exhibits abnormal muscle tone. Coordination normal. GCS eye subscore is 4. GCS verbal subscore is 4. GCS motor subscore is 6.  Skin: Skin is warm and dry.  Psychiatric: She has a normal mood and affect.    ED Course  Procedures (including critical care time) Labs Review Labs Reviewed  CBC WITH DIFFERENTIAL - Abnormal; Notable for the following:    RDW 16.2 (*)    All other components within normal limits  URINALYSIS, ROUTINE W  REFLEX MICROSCOPIC - Abnormal; Notable for the following:    APPearance CLOUDY (*)    Hgb urine dipstick MODERATE (*)    Protein, ur >300 (*)    Leukocytes, UA SMALL (*)    All other components within normal limits  COMPREHENSIVE METABOLIC PANEL - Abnormal; Notable for the following:    Sodium 134 (*)    CO2 18 (*)    Glucose, Bld 102 (*)    BUN 36 (*)    Creatinine, Ser 3.38 (*)    Albumin 3.1 (*)    GFR calc non Af Amer 15 (*)    GFR calc Af Amer 17 (*)    All other components within normal limits  URINE MICROSCOPIC-ADD ON - Abnormal; Notable for the following:    Squamous Epithelial / LPF FEW (*)    Bacteria, UA MANY (*)    All other components within normal limits  URINE CULTURE  RESPIRATORY VIRUS PANEL  TROPONIN I   Imaging Review Dg Chest 2 View  11/03/2013   CLINICAL DATA:  Smoker, cough, congestion, shortness of breath, chest pain  EXAM: CHEST  2 VIEW  COMPARISON:  10/20/2013  FINDINGS: Moderate cardiac enlargement. Mild vascular congestion less prominent when compared to the prior study. There is mild bilateral lower lobe atelectasis. There is no evidence of pulmonary edema or consolidation. There are no pleural effusions. Right sided VP shunt tubing is again identified.  IMPRESSION: Stable cardiac enlargement.  No acute findings.   Electronically Signed   By: Skipper Cliche M.D.   On: 11/03/2013 08:08   Ct Head Wo Contrast  11/03/2013   CLINICAL DATA:  Altered mental status.  EXAM: CT HEAD WITHOUT CONTRAST  TECHNIQUE: Contiguous axial images were obtained from the base  of the skull through the vertex without intravenous contrast.  COMPARISON:  CT scan of September 25, 2013.  FINDINGS: Status post right temporal craniotomy. Ventriculoperitoneal shunt is seen entering right frontal skull with tip in left lateral ventricle which is unchanged in position compared to prior exam. 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 Ventricular size is stable compared to prior exam. No  mass effect or midline shift is noted. There is no evidence of mass lesion, acute hemorrhage or acute infarction.  IMPRESSION: Right frontal ventriculoperitoneal shunt is unchanged in position. Ventricular size is within normal limits and unchanged compared to prior exam. No acute intracranial abnormality is noted.   Electronically Signed   By: Sabino Dick M.D.   On: 11/03/2013 08:28    EKG Interpretation   None      Date: 11/03/2013  Rate: 92  Rhythm: normal sinus rhythm  QRS Axis: indeterminate  Intervals: normal  ST/T Wave abnormalities: nonspecific T wave changes and New T wave flattening in V5, V6  Conduction Disutrbances:none  Narrative Interpretation: Sinus with PVC, borderline RAD, likely LVH, L artrial enlargement.  Old EKG Reviewed:    MDM   1. Hypertensive urgency    Pt is a 53 y.o. female with Pmhx as above who presents with report of feeling unwell.  She states her daughter called EMS this morning because she starting coughing & couldn't stop. She reports 1 week of productive cough with assoc CP only w/ cough, n/v, myalgias, chills.  She is hypertensive upon arrivla, but denies missing any doses of her new antihypertensives since last admission for hypertensive urgency 1/1-1/4.  On PE, Pt hypertensive, tachycardiac, tachypneic, afebrile.  She has slight wheezing & crackles in lung fields.  GCS 14 as pt not oriented to time, but no other focal neuro findings on PE.  I spoke w/ pt's daughters over the phone who states she has had some confusion for past year, worse past several weeks.  They report she has not taken her meds yesterday or this morning, is not compliant with outpt f/u appts.  EKG unchanged from prior.  CXR w/o acute findings. Trop not elevation. No acute CT head findings. Cr elevated as prior.  Pt given 20mg  IV labetelol x2 given severe HTN with mild improvement. NTG gtt started with approx 20% reduction in MAP.  [facility] contuled, will admit to step down for [facility]  urgency.  Neta Ehlers, MD 11/03/13 [phone]

## 2013-11-03 NOTE — Progress Notes (Signed)
UR completed 

## 2013-11-03 NOTE — H&P (Signed)
History and Physical   [name] [id number] DOB: 1961-04-27 DOA: 11/03/2013  Referring physician: Dr. Tawnya Crook PCP: No PCP Per Patient  Specialists: none  Chief Complaint: cough, feeling poorly overall  HPI: [name] is a 53 y.o. female has a past medical history significant for CKD, uncontrolled HTN, medication non compliance with recurrent hospitalizations for hypertensive urgency, diastolic CHF comes with generalized weakness and feeling poorly for 1-2 days. Endorses mild cough, denies flu-like symptoms. She has nausea without vomiting. Denies fevers. She states that she missed her HTN meds yesterday because she felt tired and wanted to sleep. Her daughter is managing her medications at home. Patient seems to be very flat and displays poor insight into her disease process. Currently she denies chest pain, SOB, headache. Had mild chest pain at home with coughing. In the ED blood pressure elevated > [id number] systolic, nitro gtt started. [facility] hospitalist asked to admit for HTN urgency.   Review of Systems: as per HPI otherwise negative.   Past Medical History  Diagnosis Date  . HTN (hypertension)     resistant  . Hypercholesterolemia   . Tobacco abuse     resumed smoking half a pack a day. She was a smoker in the past and states she was abl eto stop in the past using nicotine patches  . CAD (coronary artery disease)     Pt has St elevation MI in Feb 2009. Left hear tcath at tha ttime showed a 70% distal LAD lesion that was hazy consistent with a plaque rupture. She had a 50% distal circumflex stenosis an a 95% distal RCA stenosis. She did havve drug eluting stent placed in her distal RCA  . Chronic kidney disease     most recent creatinine 1.4 in 3/10  . Intracranial hemorrhage, spontaneous intraparenchymal, idiopathic, remote, resolved   . Diastolic heart failure     pt most recent echo was in Feb 2009 w an EF of 60% and severe left ventricular hypertrophy. The pt did have evidence  of diastolic dysfunction. The RV appeared normal  . Subarachnoid hemorrhage     03/2012  . Aneurysm   . CHF (congestive heart failure)     diastolic   Past Surgical History  Procedure Laterality Date  . Ventriculoperitoneal shunt  03/27/2012    Procedure: SHUNT INSERTION VENTRICULAR-PERITONEAL;  Surgeon: Winfield Cunas, MD;  Location: [place] [facility];  Service: Neurosurgery;  Laterality: N/A;  Ventricular-Peritoneal Shunt Insertion  . Cardiac catheterization    . Coronary angioplasty    . Abdominal hysterectomy    . Bascilic vein transposition Right 05/13/2013    Procedure: [place];  Surgeon: Rosetta Posner, MD;  Location: [place];  Service: Vascular;  Laterality: Right;   Social History:  reports that she has been smoking Cigarettes.  She has a 7 pack-year smoking history. She has never used smokeless tobacco. She reports that she does not drink alcohol or use illicit drugs.  Allergies  Allergen Reactions  . Ampicillin Hives and Rash  . Penicillins Hives and Rash    Family History  Problem Relation Age of Onset  . Coronary artery disease Other     premature in 1st degree relatives  . Heart disease Mother   . Heart disease Father    Prior to Admission medications   Medication Sig Start Date End Date Taking? Authorizing Provider  amLODipine (NORVASC) 10 MG tablet Take 1 tablet (10 mg total) by mouth daily. 05/18/13  Yes Janece Canterbury, MD  atorvastatin (  LIPITOR) 80 MG tablet Take 80 mg by mouth daily.   Yes Historical Provider, MD  cloNIDine (CATAPRES) 0.3 MG tablet Take 1 tablet (0.3 mg total) by mouth 3 (three) times daily. 10/23/13  Yes Velvet Bathe, MD  furosemide (LASIX) 80 MG tablet Take 1 tablet (80 mg total) by mouth 2 (two) times daily. 05/18/13  Yes Janece Canterbury, MD  hydrALAZINE (APRESOLINE) 100 MG tablet Take 1 tablet (100 mg total) by mouth 3 (three) times daily. 05/18/13  Yes Janece Canterbury, MD  isosorbide mononitrate (IMDUR) 30 MG 24 hr tablet Take 60 mg  by mouth daily.   Yes Historical Provider, MD  labetalol (NORMODYNE) 200 MG tablet Take 1 tablet (200 mg total) by mouth 3 (three) times daily. 05/18/13  Yes Janece Canterbury, MD  levETIRAcetam (KEPPRA) 500 MG tablet Take 500 mg by mouth 2 (two) times daily.   Yes Historical Provider, MD  losartan (COZAAR) 50 MG tablet Take 50 mg by mouth 2 (two) times daily.   Yes Historical Provider, MD  metoprolol (LOPRESSOR) 100 MG tablet Take 100 mg by mouth 2 (two) times daily.   Yes Historical Provider, MD  omeprazole (PRILOSEC) 20 MG capsule Take 20 mg by mouth daily.   Yes Historical Provider, MD  sodium bicarbonate 650 MG tablet Take 1 tablet (650 mg total) by mouth 3 (three) times daily. 10/14/13  Yes Barton Dubois, MD  tamsulosin (FLOMAX) 0.4 MG CAPS capsule Take 0.4 mg by mouth daily.   Yes Historical Provider, MD  calcitRIOL (ROCALTROL) 0.25 MCG capsule Take 1 capsule (0.25 mcg total) by mouth every Monday, Wednesday, and Friday. 05/18/13   Janece Canterbury, MD  camphor-menthol [facility]) lotion Apply 1 application topically as needed for itching. 05/18/13   Janece Canterbury, MD  nitroGLYCERIN (NITROSTAT) 0.4 MG SL tablet Place 1 tablet (0.4 mg total) under the tongue every 5 (five) minutes as needed for chest pain. 05/18/13   Janece Canterbury, MD   Physical Exam: Filed Vitals:   11/03/13 1130 11/03/13 1145 11/03/13 1200 11/03/13 1330  BP: 199/95 203/102 213/107 225/107  Pulse: 74     Temp:      TempSrc:      Resp: 16 14 17    Weight:      SpO2: 98%       General:  No apparent distress  Eyes: PERRL, EOMI, no scleral icterus  ENT: moist oropharynx  Neck: supple, no JVD  Cardiovascular: regular rate without MRG; 2+ peripheral pulses  Respiratory: CTA biL, good air movement without wheezing, rhonchi or crackled  Abdomen: soft, non tender to palpation, positive bowel sounds, no guarding, no rebound  Skin: no rashes  Musculoskeletal: no peripheral edema  Psychiatric: normal mood and  affect  Neurologic: CN 2-12 grossly intact, MS 5/5 in all 4  Labs on Admission:  Basic Metabolic Panel:  Recent Labs Lab 11/03/13 0914  NA 134*  K 4.1  CL 100  CO2 18*  GLUCOSE 102*  BUN 36*  CREATININE 3.38*  CALCIUM 9.2   Liver Function Tests:  Recent Labs Lab 11/03/13 0914  AST 14  ALT 21  ALKPHOS 88  BILITOT 0.5  PROT 6.8  ALBUMIN 3.1*   CBC:  Recent Labs Lab 11/03/13 0743  WBC 6.1  NEUTROABS 3.3  HGB 13.3  HCT 39.5  MCV 84.8  PLT 281   Cardiac Enzymes:  Recent Labs Lab 11/03/13 0914  TROPONINI <0.30   BNP (last 3 results)  Recent Labs  05/07/13 0500 10/13/13 2210 10/22/13 0534  PROBNP 20527.0* >70000.0* 42680.0*   Radiological Exams on Admission: Dg Chest 2 View  11/03/2013   CLINICAL DATA:  Smoker, cough, congestion, shortness of breath, chest pain  EXAM: CHEST  2 VIEW  COMPARISON:  10/20/2013  FINDINGS: Moderate cardiac enlargement. Mild vascular congestion less prominent when compared to the prior study. There is mild bilateral lower lobe atelectasis. There is no evidence of pulmonary edema or consolidation. There are no pleural effusions. Right sided VP shunt tubing is again identified.  IMPRESSION: Stable cardiac enlargement.  No acute findings.   Electronically Signed   By: Skipper Cliche M.D.   On: 11/03/2013 08:08   Ct Head Wo Contrast  11/03/2013   CLINICAL DATA:  Altered mental status.  EXAM: CT HEAD WITHOUT CONTRAST  TECHNIQUE: Contiguous axial images were obtained from the base of the skull through the vertex without intravenous contrast.  COMPARISON:  CT scan of September 25, 2013.  FINDINGS: Status post right temporal craniotomy. Ventriculoperitoneal shunt is seen entering right frontal skull with tip in left lateral ventricle which is unchanged in position compared to prior exam. 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 Ventricular size is stable compared to prior exam. No mass effect or midline shift is noted. There is no  evidence of mass lesion, acute hemorrhage or acute infarction.  IMPRESSION: Right frontal ventriculoperitoneal shunt is unchanged in position. Ventricular size is within normal limits and unchanged compared to prior exam. No acute intracranial abnormality is noted.   Electronically Signed   By: Sabino Dick M.D.   On: 11/03/2013 08:28   EKG: Independently reviewed.   Assessment/Plan Principal Problem:   Hypertensive urgency Active Problems:   Acute on chronic diastolic heart failure   H/O noncompliance with medical treatment, presenting hazards to health   HTN (hypertension), malignant   CKD (chronic kidney disease), stage IV   Hypertensive heart disease   Acute-on-chronic renal failure   Accelerated hypertension   Seizure disorder   Non compliance w medication regimen   HTN Urgency - admit to SDU, restart home medications, IV NTG. Wean off HTG drip as tolerated - goal BP 123456 systolic.  Cough - r/o flu UTI - no dysuria, but overall weakness and somnolence yesterday. Start Levofloxacin.  Acute on chronic diastolic CHF - restart lasix Seizure disorder - continue AED CKD stage IV - at baseline, she is non compliant with nephrology follow up as an outpatient.  Medication non compliance CAD  Diet: heart Fluids: none DVT Prophylaxis: heparin  Code Status: Full, presumed  Family Communication: none  Disposition Plan: SDU  Time spent: 57  Costin M. Cruzita Lederer, MD [facility] Pager [phone]  If 7PM-7AM, please contact night-coverage [email or web address] Password [email or web address] 11/03/2013, 1:51 PM

## 2013-11-03 NOTE — ED Notes (Signed)
Per EMS, pt reports that she has been having nausea and vomiting with a cough, low grade fever and generalized body aches for the past two weeks, was seen here previously for the same symptoms but these continue to progress. Pt also noted to be hypertensive with hx of the same

## 2013-11-03 NOTE — Progress Notes (Signed)
ANTIBIOTIC CONSULT NOTE - INITIAL  Pharmacy Consult for levofloxacin Indication: UTI  Allergies  Allergen Reactions  . Ampicillin Hives and Rash  . Penicillins Hives and Rash    Patient Measurements: Height: 5' 1.02" (155 cm) Weight: 130 lb (58.968 kg) IBW/kg (Calculated) : 47.86 Adjusted Body Weight:   Vital Signs: Temp: 97.5 F (36.4 C) (01/15 0710) Temp src: Oral (01/15 0710) BP: 236/109 mmHg (01/15 1400) Pulse Rate: 74 (01/15 1130) Intake/Output from previous day:   Intake/Output from this shift:    Labs:  Recent Labs  11/03/13 0743 11/03/13 0914  WBC 6.1  --   HGB 13.3  --   PLT 281  --   CREATININE  --  3.38*   Estimated Creatinine Clearance: 16.3 ml/min (by C-G formula based on Cr of 3.38). No results found for this basename: VANCOTROUGH, Corlis Leak, VANCORANDOM, GENTTROUGH, GENTPEAK, GENTRANDOM, TOBRATROUGH, TOBRAPEAK, TOBRARND, AMIKACINPEAK, AMIKACINTROU, AMIKACIN,  in the last 72 hours   Microbiology: Recent Results (from the past 720 hour(s))  MRSA PCR SCREENING     Status: None   Collection Time    10/20/13  9:45 PM      Result Value Range Status   MRSA by PCR NEGATIVE  NEGATIVE Final   Comment:            The GeneXpert MRSA Assay (FDA     approved for NASAL specimens     only), is one component of a     comprehensive MRSA colonization     surveillance program. It is not     intended to diagnose MRSA     infection nor to guide or     monitor treatment for     MRSA infections.    Medical History: Past Medical History  Diagnosis Date  . HTN (hypertension)     resistant  . Hypercholesterolemia   . Tobacco abuse     resumed smoking half a pack a day. She was a smoker in the past and states she was abl eto stop in the past using nicotine patches  . CAD (coronary artery disease)     Pt has St elevation MI in Feb 2009. Left hear tcath at tha ttime showed a 70% distal LAD lesion that was hazy consistent with a plaque rupture. She had a 50%  distal circumflex stenosis an a 95% distal RCA stenosis. She did havve drug eluting stent placed in her distal RCA  . Chronic kidney disease     most recent creatinine 1.4 in 3/10  . Intracranial hemorrhage, spontaneous intraparenchymal, idiopathic, remote, resolved   . Diastolic heart failure     pt most recent echo was in Feb 2009 w an EF of 60% and severe left ventricular hypertrophy. The pt did have evidence of diastolic dysfunction. The RV appeared normal  . Subarachnoid hemorrhage     03/2012  . Aneurysm   . CHF (congestive heart failure)     diastolic    Assessment: 52 YOM presnts with cough and feeling poorly.  Found to have elevated BP 256/131.  Orders for pharmacy to dose levofloxacin for possible UTI. Scr is elevated with h/o CKD (Scr at admit consistent with recent SCr values over last month or so, higher in July)  1/15 >>levofloxacin  >>  Tmax: afeb WBCs: 6.1 Renal: Scr = 3.38 for est CrCl = 45ml/min  1/15 urine: pending (UA = no pyuria) / respiratory virus panel: needs collected  Goal of Therapy:  Dose appropriately for renal function  Plan:  Levofloxacin 750mg  IV x 1 then 500mg  IV q49h based on current SCr and calculated CrCl  Monitor renal function and adjust antibiotics as needed  Follow cultures  Doreene Eland, PharmD, BCPS.   Pager: [phone]  11/03/2013,2:07 PM

## 2013-11-03 NOTE — ED Notes (Signed)
Bed: [id number] Expected date: 11/03/13 Expected time: 6:48 AM Means of arrival: Ambulance Comments: Flu like sx

## 2013-11-04 DIAGNOSIS — E43 Unspecified severe protein-calorie malnutrition: Secondary | ICD-10-CM | POA: Insufficient documentation

## 2013-11-04 LAB — RESPIRATORY VIRUS PANEL
ADENOVIRUS: NOT DETECTED
INFLUENZA A: NOT DETECTED
Influenza A H1: NOT DETECTED
Influenza A H3: NOT DETECTED
Influenza B: NOT DETECTED
Metapneumovirus: NOT DETECTED
PARAINFLUENZA 2 A: DETECTED — AB
Parainfluenza 1: NOT DETECTED
Parainfluenza 3: NOT DETECTED
Respiratory Syncytial Virus A: NOT DETECTED
Respiratory Syncytial Virus B: NOT DETECTED
Rhinovirus: NOT DETECTED

## 2013-11-04 LAB — CBC
HCT: 31.5 % — ABNORMAL LOW (ref 36.0–46.0)
Hemoglobin: 10.6 g/dL — ABNORMAL LOW (ref 12.0–15.0)
MCH: 28.3 pg (ref 26.0–34.0)
MCHC: 33.7 g/dL (ref 30.0–36.0)
MCV: 84.2 fL (ref 78.0–100.0)
PLATELETS: 232 10*3/uL (ref 150–400)
RBC: 3.74 MIL/uL — ABNORMAL LOW (ref 3.87–5.11)
RDW: 16 % — AB (ref 11.5–15.5)
WBC: 4.2 10*3/uL (ref 4.0–10.5)

## 2013-11-04 LAB — URINE CULTURE

## 2013-11-04 LAB — BASIC METABOLIC PANEL
BUN: 35 mg/dL — ABNORMAL HIGH (ref 6–23)
CHLORIDE: 105 meq/L (ref 96–112)
CO2: 19 mEq/L (ref 19–32)
Calcium: 8.2 mg/dL — ABNORMAL LOW (ref 8.4–10.5)
Creatinine, Ser: 3.36 mg/dL — ABNORMAL HIGH (ref 0.50–1.10)
GFR calc non Af Amer: 15 mL/min — ABNORMAL LOW (ref >90)
GFR, EST AFRICAN AMERICAN: 17 mL/min — AB (ref >90)
Glucose, Bld: 101 mg/dL — ABNORMAL HIGH (ref 70–99)
Potassium: 4.5 mEq/L (ref 3.7–5.3)
Sodium: 139 mEq/L (ref 137–147)

## 2013-11-04 MED ORDER — ENSURE COMPLETE PO LIQD
237.0000 mL | Freq: Two times a day (BID) | ORAL | Status: DC
  Administered 2013-11-05 (×2): 237 mL via ORAL

## 2013-11-04 NOTE — Progress Notes (Signed)
PROGRESS NOTE  [name] [id number] DOB: [birth year] DOA: 11/03/2013 PCP: No PCP Per Patient  Assessment/Plan: HTN Urgency - admitted to SDU, restarted home medications, IV NTG. Wean off HTG drip as tolerated  Cough - r/o flu  UTI - no dysuria, but overall weakness and somnolence yesterday. Start Levofloxacin.  Acute on chronic diastolic CHF - restart lasix  Seizure disorder - continue AED  CKD stage IV - at baseline, she is non compliant with nephrology follow up as an outpatient.  Medication non compliance  CAD   Diet: heart Fluids: none DVT Prophylaxis: heparin  Code Status: Full Family Communication: none  Disposition Plan: home when ready  Consultants:  none  Procedures:  none   Antibiotics  Anti-infectives   Start     Dose/Rate Route Frequency Ordered Stop   11/05/13 1000  levofloxacin (LEVAQUIN) IVPB 500 mg     500 mg 100 mL/hr over 60 Minutes Intravenous Every 48 hours 11/03/13 1417     11/03/13 1600  levofloxacin (LEVAQUIN) IVPB 750 mg     750 mg 100 mL/hr over 90 Minutes Intravenous  Once 11/03/13 1417 11/03/13 1725     Antibiotics Given (last 72 hours)   Date/Time Action Medication Dose Rate   11/03/13 1555 Given   levofloxacin (LEVAQUIN) IVPB 750 mg 750 mg 100 mL/hr      HPI/Subjective: - subjectively feeling much better this morning.   Objective: Filed Vitals:   11/04/13 0400 11/04/13 0500 11/04/13 0600 11/04/13 0700  BP: 178/80 190/80 192/82 198/87  Pulse: 55 57 57 55  Temp: 97.3 F (36.3 C)     TempSrc: Oral     Resp: 19 18 16 15   Height:      Weight: 59.5 kg (131 lb 2.8 oz)     SpO2: 97% 99% 99% 99%    Intake/Output Summary (Last 24 hours) at 11/04/13 0808 Last data filed at 11/04/13 0700  Gross per 24 hour  Intake 769.76 ml  Output      0 ml  Net 769.76 ml   Filed Weights   11/03/13 0710 11/03/13 1500 11/04/13 0400  Weight: 58.968 kg (130 lb) 58.8 kg (129 lb 10.1 oz) 59.5 kg (131 lb 2.8 oz)    Exam:   General:   NAD  Cardiovascular: regular rate and rhythm, without MRG  Respiratory: good air movement, clear to auscultation throughout, no wheezing, ronchi or rales  Abdomen: soft, not tender to palpation, positive bowel sounds  MSK: no peripheral edema  Neuro: non focal  Data Reviewed: Basic Metabolic Panel:  Recent Labs Lab 11/03/13 0914 11/04/13 0340  NA 134* 139  K 4.1 4.5  CL 100 105  CO2 18* 19  GLUCOSE 102* 101*  BUN 36* 35*  CREATININE 3.38* 3.36*  CALCIUM 9.2 8.2*   Liver Function Tests:  Recent Labs Lab 11/03/13 0914  AST 14  ALT 21  ALKPHOS 88  BILITOT 0.5  PROT 6.8  ALBUMIN 3.1*   No results found for this basename: LIPASE, AMYLASE,  in the last 168 hours No results found for this basename: AMMONIA,  in the last 168 hours CBC:  Recent Labs Lab 11/03/13 0743 11/04/13 0340  WBC 6.1 4.2  NEUTROABS 3.3  --   HGB 13.3 10.6*  HCT 39.5 31.5*  MCV 84.8 84.2  PLT 281 232   Cardiac Enzymes:  Recent Labs Lab 11/03/13 0914  TROPONINI <0.30   BNP (last 3 results)  Recent Labs  05/07/13 0500 10/13/13 2210 10/22/13 0534  PROBNP 20527.0* >70000.0* 42680.0*   CBG: No results found for this basename: GLUCAP,  in the last 168 hours  Recent Results (from the past 240 hour(s))  MRSA PCR SCREENING     Status: None   Collection Time    11/03/13  3:27 PM      Result Value Range Status   MRSA by PCR NEGATIVE  NEGATIVE Final   Comment:            The GeneXpert MRSA Assay (FDA     approved for NASAL specimens     only), is one component of a     comprehensive MRSA colonization     surveillance program. It is not     intended to diagnose MRSA     infection nor to guide or     monitor treatment for     MRSA infections.     Studies: Dg Chest 2 View  11/03/2013   CLINICAL DATA:  Smoker, cough, congestion, shortness of breath, chest pain  EXAM: CHEST  2 VIEW  COMPARISON:  10/20/2013  FINDINGS: Moderate cardiac enlargement. Mild vascular congestion less  prominent when compared to the prior study. There is mild bilateral lower lobe atelectasis. There is no evidence of pulmonary edema or consolidation. There are no pleural effusions. Right sided VP shunt tubing is again identified.  IMPRESSION: Stable cardiac enlargement.  No acute findings.   Electronically Signed   By: Skipper Cliche M.D.   On: 11/03/2013 08:08   Ct Head Wo Contrast  11/03/2013   CLINICAL DATA:  Altered mental status.  EXAM: CT HEAD WITHOUT CONTRAST  TECHNIQUE: Contiguous axial images were obtained from the base of the skull through the vertex without intravenous contrast.  COMPARISON:  CT scan of September 25, 2013.  FINDINGS: Status post right temporal craniotomy. Ventriculoperitoneal shunt is seen entering right frontal skull with tip in left lateral ventricle which is unchanged in position compared to prior exam. 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 Ventricular size is stable compared to prior exam. No mass effect or midline shift is noted. There is no evidence of mass lesion, acute hemorrhage or acute infarction.  IMPRESSION: Right frontal ventriculoperitoneal shunt is unchanged in position. Ventricular size is within normal limits and unchanged compared to prior exam. No acute intracranial abnormality is noted.   Electronically Signed   By: Sabino Dick M.D.   On: 11/03/2013 08:28    Scheduled Meds: . amLODipine  10 mg Oral Daily  . atorvastatin  80 mg Oral Daily  . cloNIDine  0.3 mg Oral TID  . furosemide  80 mg Oral BID  . heparin  5,000 Units Subcutaneous Q8H  . hydrALAZINE  100 mg Oral TID  . influenza vac split quadrivalent PF  0.5 mL Intramuscular Tomorrow-1000  . isosorbide mononitrate  60 mg Oral Daily  . labetalol  200 mg Oral TID  . levETIRAcetam  500 mg Oral BID  . [START ON 11/05/2013] levofloxacin (LEVAQUIN) IV  500 mg Intravenous Q48H  . metoprolol  100 mg Oral BID  . sodium bicarbonate  650 mg Oral TID  . sodium chloride  3 mL Intravenous Q12H     Continuous Infusions: . nitroGLYCERIN 115 mcg/min (11/04/13 0723)    Principal Problem:   Hypertensive urgency Active Problems:   Acute on chronic diastolic heart failure   H/O noncompliance with medical treatment, presenting hazards to health   HTN (hypertension), malignant   CKD (chronic kidney disease), stage IV  Hypertensive heart disease   Acute-on-chronic renal failure   Accelerated hypertension   Seizure disorder   Non compliance w medication regimen   Time spent: [place], MD [facility] Pager [phone]. If 7 PM - 7 AM, please contact night-coverage at [email or web address], password [facility] 11/04/2013, 8:08 AM  LOS: 1 day

## 2013-11-04 NOTE — Progress Notes (Signed)
INITIAL NUTRITION ASSESSMENT  Pt meets criteria for severe MALNUTRITION in the context of chronic illness as evidenced by <75% estimated energy intake with 7.7% weight loss in the past month.  DOCUMENTATION CODES Per approved criteria  -Severe malnutrition in the context of chronic illness   INTERVENTION: - Recommend MD change diet to renal diet - Ensure Complete BID - Will continue to monitor   NUTRITION DIAGNOSIS: Inadequate oral intake related to poor appetite, nausea as evidenced by <50% meal intake.    Goal: Pt to consume >90% of meals/supplements  Monitor:  Weights, labs, intake  Reason for Assessment: Malnutrition screening tool   53 y.o. female  Admitting Dx: Hypertensive urgency  ASSESSMENT: Admitted with nausea/vomiting for the past 2 weeks, recently d/c 10/23/13. Known to RD from past admission. Pt with history significant for CKD, uncontrolled HTN, medication non compliance with recurrent hospitalizations for hypertensive urgency.   Met with pt and daughters who report pt not eating well for the past week but was getting in about 1 meal/day. Daughters report pt continues to eat whatever she wants and smoke despite their encouragement for her to follow lower sodium diet and quit smoking. Pt's weight down 11 pounds in the past 2 weeks. Pt was working on breakfast, only ate about 25% of meals. Interested in getting Ensure Complete.   BUN/Cr elevated with low GFR   Height: Ht Readings from Last 1 Encounters:  11/03/13 '5\' 1"'  (1.549 m)    Weight: Wt Readings from Last 1 Encounters:  11/04/13 131 lb 2.8 oz (59.5 kg)    Ideal Body Weight: 105 lb   % Ideal Body Weight: 125%  Wt Readings from Last 10 Encounters:  11/04/13 131 lb 2.8 oz (59.5 kg)  10/23/13 130 lb 8.2 oz (59.2 kg)  10/14/13 140 lb 8 oz (63.73 kg)  09/27/13 137 lb 1.6 oz (62.188 kg)  05/17/13 134 lb 14.7 oz (61.2 kg)  05/14/13 137 lb 12.6 oz (62.5 kg)  05/14/13 137 lb 12.6 oz (62.5 kg)  09/28/12  169 lb (76.658 kg)  05/03/12 141 lb 1.5 oz (64 kg)  04/27/12 147 lb 11.3 oz (67 kg)    Usual Body Weight: 142 lb earlier this month  % Usual Body Weight: 92%  BMI:  Body mass index is 24.8 kg/(m^2).  Estimated Nutritional Needs: Kcal: 1500-1700 Protein: 36-60g Fluid: 1.5-1.7L/day  Skin: Intact   Diet Order: Cardiac  EDUCATION NEEDS: -No education needs identified at this time   Intake/Output Summary (Last 24 hours) at 11/04/13 1114 Last data filed at 11/04/13 0942  Gross per 24 hour  Intake 897.41 ml  Output      0 ml  Net 897.41 ml    Last BM: PTA  Labs:   Recent Labs Lab 11/03/13 0914 11/04/13 0340  NA 134* 139  K 4.1 4.5  CL 100 105  CO2 18* 19  BUN 36* 35*  CREATININE 3.38* 3.36*  CALCIUM 9.2 8.2*  GLUCOSE 102* 101*    CBG (last 3)  No results found for this basename: GLUCAP,  in the last 72 hours  Scheduled Meds: . amLODipine  10 mg Oral Daily  . atorvastatin  80 mg Oral Daily  . cloNIDine  0.3 mg Oral TID  . furosemide  80 mg Oral BID  . heparin  5,000 Units Subcutaneous Q8H  . hydrALAZINE  100 mg Oral TID  . influenza vac split quadrivalent PF  0.5 mL Intramuscular Tomorrow-1000  . isosorbide mononitrate  60 mg Oral Daily  .  labetalol  200 mg Oral TID  . levETIRAcetam  500 mg Oral BID  . [START ON 11/05/2013] levofloxacin (LEVAQUIN) IV  500 mg Intravenous Q48H  . metoprolol  100 mg Oral BID  . sodium bicarbonate  650 mg Oral TID  . sodium chloride  3 mL Intravenous Q12H    Continuous Infusions: . nitroGLYCERIN 100 mcg/min (11/04/13 1112)    Past Medical History  Diagnosis Date  . HTN (hypertension)     resistant  . Hypercholesterolemia   . Tobacco abuse     resumed smoking half a pack a day. She was a smoker in the past and states she was abl eto stop in the past using nicotine patches  . CAD (coronary artery disease)     Pt has St elevation MI in Feb 2009. Left hear tcath at tha ttime showed a 70% distal LAD lesion that was  hazy consistent with a plaque rupture. She had a 50% distal circumflex stenosis an a 95% distal RCA stenosis. She did havve drug eluting stent placed in her distal RCA  . Chronic kidney disease     most recent creatinine 1.4 in 3/10  . Intracranial hemorrhage, spontaneous intraparenchymal, idiopathic, remote, resolved   . Diastolic heart failure     pt most recent echo was in Feb 2009 w an EF of 60% and severe left ventricular hypertrophy. The pt did have evidence of diastolic dysfunction. The RV appeared normal  . Subarachnoid hemorrhage     03/2012  . Aneurysm   . CHF (congestive heart failure)     diastolic    Past Surgical History  Procedure Laterality Date  . Ventriculoperitoneal shunt  03/27/2012    Procedure: SHUNT INSERTION VENTRICULAR-PERITONEAL;  Surgeon: Winfield Cunas, MD;  Location: [facility];  Service: Neurosurgery;  Laterality: N/A;  Ventricular-Peritoneal Shunt Insertion  . Cardiac catheterization    . Coronary angioplasty    . Abdominal hysterectomy    . Bascilic vein transposition Right 05/13/2013    Procedure: [place];  Surgeon: Rosetta Posner, MD;  Location: [facility];  Service: Vascular;  Laterality: Right;    Mikey College MS, [place], [place] Pager [phone] After Hours Pager

## 2013-11-05 ENCOUNTER — Encounter (HOSPITAL_COMMUNITY): Payer: Self-pay | Admitting: Cardiology

## 2013-11-05 DIAGNOSIS — Z91199 Patient's noncompliance with other medical treatment and regimen due to unspecified reason: Secondary | ICD-10-CM

## 2013-11-05 DIAGNOSIS — Z9119 Patient's noncompliance with other medical treatment and regimen: Secondary | ICD-10-CM

## 2013-11-05 DIAGNOSIS — F17200 Nicotine dependence, unspecified, uncomplicated: Secondary | ICD-10-CM

## 2013-11-05 DIAGNOSIS — I2510 Atherosclerotic heart disease of native coronary artery without angina pectoris: Secondary | ICD-10-CM

## 2013-11-05 DIAGNOSIS — I10 Essential (primary) hypertension: Secondary | ICD-10-CM

## 2013-11-05 LAB — BASIC METABOLIC PANEL
BUN: 32 mg/dL — ABNORMAL HIGH (ref 6–23)
CALCIUM: 8.4 mg/dL (ref 8.4–10.5)
CO2: 20 mEq/L (ref 19–32)
CREATININE: 3.39 mg/dL — AB (ref 0.50–1.10)
Chloride: 105 mEq/L (ref 96–112)
GFR calc Af Amer: 17 mL/min — ABNORMAL LOW (ref >90)
GFR calc non Af Amer: 15 mL/min — ABNORMAL LOW (ref >90)
Glucose, Bld: 120 mg/dL — ABNORMAL HIGH (ref 70–99)
Potassium: 3.4 mEq/L — ABNORMAL LOW (ref 3.7–5.3)
Sodium: 141 mEq/L (ref 137–147)

## 2013-11-05 LAB — CBC
HCT: 31.8 % — ABNORMAL LOW (ref 36.0–46.0)
Hemoglobin: 10.5 g/dL — ABNORMAL LOW (ref 12.0–15.0)
MCH: 27.9 pg (ref 26.0–34.0)
MCHC: 33 g/dL (ref 30.0–36.0)
MCV: 84.6 fL (ref 78.0–100.0)
Platelets: 239 10*3/uL (ref 150–400)
RBC: 3.76 MIL/uL — AB (ref 3.87–5.11)
RDW: 16.1 % — ABNORMAL HIGH (ref 11.5–15.5)
WBC: 4.3 10*3/uL (ref 4.0–10.5)

## 2013-11-05 MED ORDER — LABETALOL HCL 300 MG PO TABS
300.0000 mg | ORAL_TABLET | Freq: Three times a day (TID) | ORAL | Status: DC
  Administered 2013-11-05 – 2013-11-07 (×6): 300 mg via ORAL
  Filled 2013-11-05 (×9): qty 1

## 2013-11-05 MED ORDER — POTASSIUM CHLORIDE CRYS ER 20 MEQ PO TBCR
40.0000 meq | EXTENDED_RELEASE_TABLET | Freq: Once | ORAL | Status: AC
  Administered 2013-11-05: 40 meq via ORAL
  Filled 2013-11-05: qty 2

## 2013-11-05 MED ORDER — METOPROLOL TARTRATE 50 MG PO TABS
50.0000 mg | ORAL_TABLET | Freq: Two times a day (BID) | ORAL | Status: DC
  Administered 2013-11-05 – 2013-11-06 (×3): 50 mg via ORAL
  Filled 2013-11-05 (×4): qty 1

## 2013-11-05 MED ORDER — MINOXIDIL 2.5 MG PO TABS
5.0000 mg | ORAL_TABLET | Freq: Two times a day (BID) | ORAL | Status: DC
  Administered 2013-11-05 – 2013-11-07 (×5): 5 mg via ORAL
  Filled 2013-11-05 (×6): qty 2

## 2013-11-05 NOTE — Progress Notes (Signed)
PROGRESS NOTE  [name] [id number] DOB: 11-01-1960 DOA: 11/03/2013 PCP: No PCP Per Patient  Assessment/Plan: HTN Urgency - admitted to SDU, restarted home medications, IV NTG, now closing to 48h on the gtt.  - Wean off HTG drip as tolerated  - consulted cardiology today, appreciate input, switch to Minoxidil - currently on 300 Labetalol TID, Imdur 60, Norvasc 10, Lasix 80 BID, Clonidine 0.3 mg TID, Metop 50 BID, Minoxidil 5 BID (started today, replacing Hydralazine) Cough - parainfluenza 2 + - continue empiric Levofloxacin for presumed bronchitis. UTI - no dysuria, but overall weakness and somnolence yesterday.  - Start Levofloxacin.  Acute on chronic diastolic CHF - restart lasix  Seizure disorder - continue AED  CKD stage IV - at baseline, she is non compliant with nephrology follow up as an outpatient.  Medication non compliance - this is a big factor in her readmission profile in the past few months. She also does not follow regularly with her outpatient team.  CAD  Diet: heart Fluids: none DVT Prophylaxis: heparin  Code Status: Full Family Communication: none  Disposition Plan: home when ready  Consultants:  Cardiology  Procedures:  none   Antibiotics - Levofloxacin 1/15 >>  HPI/Subjective: - feeling better  Objective: Filed Vitals:   11/05/13 0400 11/05/13 0500 11/05/13 0528 11/05/13 0600  BP: 181/78 185/75  183/79  Pulse: 56 58  56  Temp:   97.1 F (36.2 C)   TempSrc:   Oral   Resp: '16 19  21  ' Height:      Weight:      SpO2: 98% 95%  98%    Intake/Output Summary (Last 24 hours) at 11/05/13 0709 Last data filed at 11/05/13 0600  Gross per 24 hour  Intake 1058.2 ml  Output    300 ml  Net  758.2 ml   Filed Weights   11/03/13 1500 11/04/13 0400 11/05/13 0132  Weight: 58.8 kg (129 lb 10.1 oz) 59.5 kg (131 lb 2.8 oz) 59.9 kg (132 lb 0.9 oz)    Exam:   General:  NAD  Cardiovascular: regular rate and rhythm, without MRG  Respiratory:  good air movement, clear to auscultation throughout, no wheezing, ronchi or rales  Abdomen: soft, not tender to palpation, positive bowel sounds  MSK: no peripheral edema  Neuro: non focal  Data Reviewed: Basic Metabolic Panel:  Recent Labs Lab 11/03/13 0914 11/04/13 0340 11/05/13 0350  NA 134* 139 141  K 4.1 4.5 3.4*  CL 100 105 105  CO2 18* 19 20  GLUCOSE 102* 101* 120*  BUN 36* 35* 32*  CREATININE 3.38* 3.36* 3.39*  CALCIUM 9.2 8.2* 8.4   Liver Function Tests:  Recent Labs Lab 11/03/13 0914  AST 14  ALT 21  ALKPHOS 88  BILITOT 0.5  PROT 6.8  ALBUMIN 3.1*   CBC:  Recent Labs Lab 11/03/13 0743 11/04/13 0340 11/05/13 0350  WBC 6.1 4.2 4.3  NEUTROABS 3.3  --   --   HGB 13.3 10.6* 10.5*  HCT 39.5 31.5* 31.8*  MCV 84.8 84.2 84.6  PLT 281 232 239   Cardiac Enzymes:  Recent Labs Lab 11/03/13 0914  TROPONINI <0.30   BNP (last 3 results)  Recent Labs  05/07/13 0500 10/13/13 2210 10/22/13 0534  PROBNP 20527.0* >70000.0* 42680.0*    Recent Results (from the past 240 hour(s))  RESPIRATORY VIRUS PANEL     Status: Abnormal   Collection Time    11/03/13 10:36 AM      Result  Value Range Status   Source - RVPAN NASAL SWAB   Corrected   Comment: CORRECTED ON 01/16 AT 2216: PREVIOUSLY REPORTED AS NASAL SWAB   Respiratory Syncytial Virus A NOT DETECTED   Final   Respiratory Syncytial Virus B NOT DETECTED   Final   Influenza A NOT DETECTED   Final   Influenza B NOT DETECTED   Final   Parainfluenza 1 NOT DETECTED   Final   Parainfluenza 2 DETECTED (*)  Final   Parainfluenza 3 NOT DETECTED   Final   Metapneumovirus NOT DETECTED   Final   Rhinovirus NOT DETECTED   Final   Adenovirus NOT DETECTED   Final   Influenza A H1 NOT DETECTED   Final   Influenza A H3 NOT DETECTED   Final   Comment: (NOTE)           Normal Reference Range for each Analyte: NOT DETECTED     Testing performed using the Lumine[employer] xTAG Respiratory Viral Panel test     kit.     This  test was developed and its performance characteristics determined     by [facility]. It has not been cleared or approved by the [place]     Food and Drug Administration. This test is used for clinical purposes.     It should not be regarded as investigational or for research. This     laboratory is certified under the [place] (CLIA) as qualified to perform high complexity     clinical laboratory testing.     Performed at [place]     Status: None   Collection Time    11/03/13 11:27 AM      Result Value Range Status   Specimen Description URINE, CLEAN CATCH   Final   Special Requests NONE   Final   Culture  Setup Time     Final   Value: 11/03/2013 14:30     Performed at [facility] Count     Final   Value: >=100,000 COLONIES/ML     Performed at [facility]   Culture     Final   Value: Multiple bacterial morphotypes present, none predominant. Suggest appropriate recollection if clinically indicated.     Performed at [facility]   Report Status 11/04/2013 FINAL   Final  MRSA PCR SCREENING     Status: None   Collection Time    11/03/13  3:27 PM      Result Value Range Status   MRSA by PCR NEGATIVE  NEGATIVE Final   Comment:            The GeneXpert MRSA Assay (FDA     approved for NASAL specimens     only), is one component of a     comprehensive MRSA colonization     surveillance program. It is not     intended to diagnose MRSA     infection nor to guide or     monitor treatment for     MRSA infections.     Studies: Dg Chest 2 View  11/03/2013   CLINICAL DATA:  Smoker, cough, congestion, shortness of breath, chest pain  EXAM: CHEST  2 VIEW  COMPARISON:  10/20/2013  FINDINGS: Moderate cardiac enlargement. Mild vascular congestion less prominent when compared to the prior study. There is mild bilateral lower lobe atelectasis. There is no evidence of  pulmonary edema  or consolidation. There are no pleural effusions. Right sided VP shunt tubing is again identified.  IMPRESSION: Stable cardiac enlargement.  No acute findings.   Electronically Signed   By: Skipper Cliche M.D.   On: 11/03/2013 08:08   Ct Head Wo Contrast  11/03/2013   CLINICAL DATA:  Altered mental status.  EXAM: CT HEAD WITHOUT CONTRAST  TECHNIQUE: Contiguous axial images were obtained from the base of the skull through the vertex without intravenous contrast.  COMPARISON:  CT scan of September 25, 2013.  FINDINGS: Status post right temporal craniotomy. Ventriculoperitoneal shunt is seen entering right frontal skull with tip in left lateral ventricle which is unchanged in position compared to prior exam. 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 Ventricular size is stable compared to prior exam. No mass effect or midline shift is noted. There is no evidence of mass lesion, acute hemorrhage or acute infarction.  IMPRESSION: Right frontal ventriculoperitoneal shunt is unchanged in position. Ventricular size is within normal limits and unchanged compared to prior exam. No acute intracranial abnormality is noted.   Electronically Signed   By: Sabino Dick M.D.   On: 11/03/2013 08:28    Scheduled Meds: . amLODipine  10 mg Oral Daily  . atorvastatin  80 mg Oral Daily  . cloNIDine  0.3 mg Oral TID  . feeding supplement (ENSURE COMPLETE)  237 mL Oral BID BM  . furosemide  80 mg Oral BID  . heparin  5,000 Units Subcutaneous Q8H  . hydrALAZINE  100 mg Oral TID  . influenza vac split quadrivalent PF  0.5 mL Intramuscular Tomorrow-1000  . isosorbide mononitrate  60 mg Oral Daily  . labetalol  200 mg Oral TID  . levETIRAcetam  500 mg Oral BID  . levofloxacin (LEVAQUIN) IV  500 mg Intravenous Q48H  . metoprolol  100 mg Oral BID  . sodium bicarbonate  650 mg Oral TID  . sodium chloride  3 mL Intravenous Q12H   Continuous Infusions: . nitroGLYCERIN 90 mcg/min (11/05/13 0600)    Principal  Problem:   Hypertensive urgency Active Problems:   Acute on chronic diastolic heart failure   H/O noncompliance with medical treatment, presenting hazards to health   HTN (hypertension), malignant   CKD (chronic kidney disease), stage IV   Hypertensive heart disease   Acute-on-chronic renal failure   Accelerated hypertension   Seizure disorder   Non compliance w medication regimen   Protein-calorie malnutrition, severe  Time spent: [place], MD [facility] Pager [phone]. If 7 PM - 7 AM, please contact night-coverage at [email or web address], password [facility] 11/05/2013, 7:09 AM  LOS: 2 days

## 2013-11-05 NOTE — Consult Note (Signed)
Primary cardiologist: Dr. Loralie Champagne Consulting cardiologist: Dr. Satira Sark  Clinical Summary [name] is a 53 y.o.female admitted  to the hospital with 2 day history of general malaise, cough, nausea but no emesis, no definite fevers. She admits that she has missed regular use of her antihypertensives recently, was found to be substantially hypertensive, admitted for further management of hypertensive urgency.  Review finds similar presentations and a history of noncompliance with medications. She was last seen in the cardiology practice back in 2012. Lexiscan Myoview in September 2012 demonstrated no evidence of ischemia with LVEF 58%. More recently echocardiogram in July 2014 demonstrated LVEF of 60-65% with grade 2 diastolic dysfunction.  Additional workup findings bronchitis and UTI, currently on antibiotic treatment. She also has evidence of progressive renal insufficiency with creatinine of 3.3. Cardiac markers are normal.  Parainfluenza 2 virus detected on screening. ECG shows sinus rhythm with PVC and increased voltage in the lateral leads with decreased R-wave progression.  [facility] consulted to assist with management of blood pressure. Patient had been on IV nitroglycerin nearing 48 hours. Was placed back on her home medications, and after discussion with Dr. Sallyanne Kuster patient has now been started on minoxidil. This places hydralazine.   Allergies  Allergen Reactions  . Ampicillin Hives and Rash  . Penicillins Hives and Rash    Medications Scheduled Medications: . amLODipine  10 mg Oral Daily  . atorvastatin  80 mg Oral Daily  . cloNIDine  0.3 mg Oral TID  . feeding supplement (ENSURE COMPLETE)  237 mL Oral BID BM  . furosemide  80 mg Oral BID  . heparin  5,000 Units Subcutaneous Q8H  . isosorbide mononitrate  60 mg Oral Daily  . labetalol  300 mg Oral TID  . levETIRAcetam  500 mg Oral BID  . levofloxacin (LEVAQUIN) IV  500 mg Intravenous Q48H  .  metoprolol  50 mg Oral BID  . minoxidil  5 mg Oral BID  . sodium bicarbonate  650 mg Oral TID  . sodium chloride  3 mL Intravenous Q12H    Infusions: . nitroGLYCERIN 70 mcg/min (11/05/13 1700)     Past Medical History  Diagnosis Date  . HTN (hypertension)     Resistant  . Hypercholesterolemia   . Tobacco abuse     Resumed smoking half a pack a day. She was a smoker in the past and states she was abl eto stop in the past using nicotine patches  . Coronary atherosclerosis of native coronary artery     STEMI Feb 2009 - 70% distal LAD lesion c/w plaque rupture, DES distal RCA  . CKD (chronic kidney disease) stage 3, GFR 30-59 ml/min     Most recent creatinine 1.4 in 3/10  . Diastolic heart failure     LVEF 60-65% with grade 2 diastolic dysfunction - July 2014  . Subarachnoid hemorrhage     03/2012  . Aneurysm     Past Surgical History  Procedure Laterality Date  . Ventriculoperitoneal shunt  03/27/2012    Procedure: SHUNT INSERTION VENTRICULAR-PERITONEAL;  Surgeon: Winfield Cunas, MD;  Location: [facility];  Service: Neurosurgery;  Laterality: N/A;  Ventricular-Peritoneal Shunt Insertion  . Abdominal hysterectomy    . Bascilic vein transposition Right 05/13/2013    Procedure: [place];  Surgeon: Rosetta Posner, MD;  Location: [facility] OR;  Service: Vascular;  Laterality: Right;    Family History  Problem Relation Age of Onset  . Coronary artery disease  Other     Premature in 1st degree relatives  . Heart disease Mother   . Heart disease Father     Social History [name] reports that she has been smoking Cigarettes.  She has a 7 pack-year smoking history. She has never used smokeless tobacco. [name] reports that she does not drink alcohol.  Review of Systems Head no angina symptoms. Otherwise as outlined above.  Physical Examination Blood pressure 128/60, pulse 58, temperature 97.3 F (36.3 C), temperature source Oral, resp. rate 21, height 5\' 1"  (1.549  m), weight 132 lb 0.9 oz (59.9 kg), SpO2 99.00%.  Intake/Output Summary (Last 24 hours) at 11/05/13 1722 Last data filed at 11/05/13 1700  Gross per 24 hour  Intake 1070.85 ml  Output    600 ml  Net 470.85 ml   Telemetry: Sinus rhythm.  No distress. HEENT: Conjunctiva and lids normal, oropharynx clear. Neck: Supple, no elevated JVP or carotid bruits, no thyromegaly. Lungs: No wheezing or rhonchi, nonlabored breathing at rest. Cardiac: Regular rate and rhythm, no S3 or significant systolic murmur, no pericardial rub. Abdomen: Soft, nontender, bowel sounds present. Extremities: No pitting edema, distal pulses 2+. Skin: Warm and dry. Musculoskeletal: No kyphosis. Neuropsychiatric: Alert and oriented x3, calm.   Lab Results  Basic Metabolic Panel:  Recent Labs Lab 11/03/13 0914 11/04/13 0340 11/05/13 0350  NA 134* 139 141  K 4.1 4.5 3.4*  CL 100 105 105  CO2 18* 19 20  GLUCOSE 102* 101* 120*  BUN 36* 35* 32*  CREATININE 3.38* 3.36* 3.39*  CALCIUM 9.2 8.2* 8.4    Liver Function Tests:  Recent Labs Lab 11/03/13 0914  AST 14  ALT 21  ALKPHOS 88  BILITOT 0.5  PROT 6.8  ALBUMIN 3.1*    CBC:  Recent Labs Lab 11/03/13 0743 11/04/13 0340 11/05/13 0350  WBC 6.1 4.2 4.3  NEUTROABS 3.3  --   --   HGB 13.3 10.6* 10.5*  HCT 39.5 31.5* 31.8*  MCV 84.8 84.2 84.6  PLT 281 232 239    Cardiac Enzymes:  Recent Labs Lab 11/03/13 0914  TROPONINI <0.30    Imaging CHEST 2 VIEW  COMPARISON: 10/20/2013  FINDINGS: Moderate cardiac enlargement. Mild vascular congestion less prominent when compared to the prior study. There is mild bilateral lower lobe atelectasis. There is no evidence of pulmonary edema or consolidation. There are no pleural effusions. Right sided VP shunt tubing is again identified.  IMPRESSION: Stable cardiac enlargement. No acute findings.   Impression  1. Hypertensive urgency complicated by medication noncompliance. Blood pressure  is trending down well at this time. Weaning off IV nitroglycerin.  2. Previous he documented history of medication noncompliance, adversely affects patient's health status and complicates her management.  3. History of CAD status post DES to the RCA in 2009, residual disease managed medically at that point. Most recent LVEF 60-65% within the last 6 months.  4. Acute on chronic kidney disease. Creatinine now 3.3. Probably at least partially contributed to by uncontrolled hypertension over the years.  5. Tobacco abuse.  6. Bronchitis, parainfluenza 2 positive.  Recommendations  Current cardiac regimen includes Norvasc, clonidine, Imdur, labetalol, Lopressor, and minoxidil. It is somewhat hard to know how her blood pressure will do since she has not been consistent with her medications. May actually overshoot control. Would try and convert her to a single beta blocker alone, probably labetalol rather than Lopressor. No ARB with her progressive renal failure. Our service will follow with you.  Satira Sark, M.D., F.A.C.C.

## 2013-11-06 DIAGNOSIS — I129 Hypertensive chronic kidney disease with stage 1 through stage 4 chronic kidney disease, or unspecified chronic kidney disease: Secondary | ICD-10-CM | POA: Diagnosis present

## 2013-11-06 LAB — BASIC METABOLIC PANEL
BUN: 30 mg/dL — ABNORMAL HIGH (ref 6–23)
CALCIUM: 8.9 mg/dL (ref 8.4–10.5)
CO2: 21 mEq/L (ref 19–32)
CREATININE: 3.5 mg/dL — AB (ref 0.50–1.10)
Chloride: 103 mEq/L (ref 96–112)
GFR calc Af Amer: 16 mL/min — ABNORMAL LOW (ref >90)
GFR, EST NON AFRICAN AMERICAN: 14 mL/min — AB (ref >90)
GLUCOSE: 104 mg/dL — AB (ref 70–99)
Potassium: 4 mEq/L (ref 3.7–5.3)
SODIUM: 140 meq/L (ref 137–147)

## 2013-11-06 MED ORDER — LEVOFLOXACIN IN D5W 250 MG/50ML IV SOLN
250.0000 mg | INTRAVENOUS | Status: DC
  Filled 2013-11-06: qty 50

## 2013-11-06 NOTE — Consult Note (Signed)
Renal Service Consult Note [facility]  [name] 11/06/2013 [name] Requesting Physician: Dr Cruzita Lederer  Reason for Consult:  CKD 4/5 HPI: The patient is a 53 y.o. year-old with hx of HTN, SAH 6/13, diast HF, CAD w stent 2009. She has known CKD and was admitted w gen weakness, headaches, feeling "poorly", nausea and tired. BP was 257/138 in ED on arrival. She was not taking her BP medications. BP controlled now on BP meds and po lasix 80 bid. 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 Did not keep f/u appts. Renal asked to see pt for OP followup of CKD.  We had a discussion about dialysis. She said she will not do dialysis. She said her father was on dialysis for " along time", and when she was older she drove him to and from dialysis. She said she wants to live, citing 8 grandchildren, but would not do dialysis if that was what it would take to keep her alive.  I asked her what her father didn't like about dialysis and she said "everything". I asked her if she would followup with the kidney doctors regardless of dialysis to help manage her BP and kidney disease and she said "No", that she would prefer to [facility]. I asked her if she was going to take her BP medications when she went home. She said "my daughters will be sure to take care of that", adding "they don't like coming up here."  I asked her if she would like me to speak to her family about her wishes regarding dialysis and kidney problems and she said yes, I could call her daughter [name], and I did call but there was no answer.   I asked her about life support issues and she intimated that no matter what she wants her "daughters will go against it."  She apparently has a problem with long-term incontinence and sleeps "on plastic" at home according to RN. Also she is very upset that her vehicle was "taken  away" and that she can no longer drive.   ROS  no sob or cough  no n/v/d  no abd pain  no   Past Medical History  Past Medical History  Diagnosis Date  . HTN (hypertension)     Resistant  . Hypercholesterolemia   . Tobacco abuse     Resumed smoking half a pack a day. She was a smoker in the past and states she was abl eto stop in the past using nicotine patches  . Coronary atherosclerosis of native coronary artery     STEMI Feb 2009 - 70% distal LAD lesion c/w plaque rupture, DES distal RCA  . CKD (chronic kidney disease) stage 3, GFR 30-59 ml/min     Most recent creatinine 1.4 in 3/10  . Diastolic heart failure     LVEF 60-65% with grade 2 diastolic dysfunction - July 2014  . Subarachnoid hemorrhage     03/2012  . Aneurysm    Past Surgical History  Past Surgical History  Procedure Laterality Date  . Ventriculoperitoneal shunt  03/27/2012    Procedure: SHUNT INSERTION VENTRICULAR-PERITONEAL;  Surgeon: Winfield Cunas, MD;  Location: [place] [facility];  Service: Neurosurgery;  Laterality: N/A;  Ventricular-Peritoneal Shunt Insertion  . Abdominal hysterectomy    . Bascilic vein transposition Right 05/13/2013    Procedure: BASCILIC VEIN TRANSPOSITION;  Surgeon: Rosetta Posner, MD;  Location: [facility] OR;  Service: Vascular;  Laterality: Right;   Family History  Family History  Problem Relation Age of Onset  . Coronary artery disease Other     Premature in 1st degree relatives  . Heart disease Mother   . Heart disease Father    Social History  reports that she has been smoking Cigarettes.  She has a 7 pack-year smoking history. She has never used smokeless tobacco. She reports that she does not drink alcohol or use illicit drugs. Allergies  Allergies  Allergen Reactions  . Ampicillin Hives and Rash  . Penicillins Hives and Rash   Home medications Prior to Admission medications   Medication Sig Start Date End Date Taking? Authorizing Provider  amLODipine (NORVASC) 10 MG tablet Take 1  tablet (10 mg total) by mouth daily. 05/18/13  Yes Janece Canterbury, MD  atorvastatin (LIPITOR) 80 MG tablet Take 80 mg by mouth daily.   Yes Historical Provider, MD  cloNIDine (CATAPRES) 0.3 MG tablet Take 1 tablet (0.3 mg total) by mouth 3 (three) times daily. 10/23/13  Yes Velvet Bathe, MD  furosemide (LASIX) 80 MG tablet Take 1 tablet (80 mg total) by mouth 2 (two) times daily. 05/18/13  Yes Janece Canterbury, MD  hydrALAZINE (APRESOLINE) 100 MG tablet Take 1 tablet (100 mg total) by mouth 3 (three) times daily. 05/18/13  Yes Janece Canterbury, MD  isosorbide mononitrate (IMDUR) 30 MG 24 hr tablet Take 60 mg by mouth daily.   Yes Historical Provider, MD  labetalol (NORMODYNE) 200 MG tablet Take 1 tablet (200 mg total) by mouth 3 (three) times daily. 05/18/13  Yes Janece Canterbury, MD  levETIRAcetam (KEPPRA) 500 MG tablet Take 500 mg by mouth 2 (two) times daily.   Yes Historical Provider, MD  losartan (COZAAR) 50 MG tablet Take 50 mg by mouth 2 (two) times daily.   Yes Historical Provider, MD  metoprolol (LOPRESSOR) 100 MG tablet Take 100 mg by mouth 2 (two) times daily.   Yes Historical Provider, MD  omeprazole (PRILOSEC) 20 MG capsule Take 20 mg by mouth daily.   Yes Historical Provider, MD  sodium bicarbonate 650 MG tablet Take 1 tablet (650 mg total) by mouth 3 (three) times daily. 10/14/13  Yes Barton Dubois, MD  tamsulosin (FLOMAX) 0.4 MG CAPS capsule Take 0.4 mg by mouth daily.   Yes Historical Provider, MD  calcitRIOL (ROCALTROL) 0.25 MCG capsule Take 1 capsule (0.25 mcg total) by mouth every Monday, Wednesday, and Friday. 05/18/13   Janece Canterbury, MD  camphor-menthol [facility]) lotion Apply 1 application topically as needed for itching. 05/18/13   Janece Canterbury, MD  nitroGLYCERIN (NITROSTAT) 0.4 MG SL tablet Place 1 tablet (0.4 mg total) under the tongue every 5 (five) minutes as needed for chest pain. 05/18/13   Janece Canterbury, MD   Liver Function Tests  Recent Labs Lab 11/03/13 0914  AST 14   ALT 21  ALKPHOS 88  BILITOT 0.5  PROT 6.8  ALBUMIN 3.1*   No results found for this basename: LIPASE, AMYLASE,  in the last 168 hours CBC  Recent Labs Lab 11/03/13 0743 11/04/13 0340 11/05/13 0350  WBC 6.1 4.2 4.3  NEUTROABS 3.3  --   --   HGB 13.3 10.6* 10.5*  HCT 39.5 31.5* 31.8*  MCV 84.8 84.2 84.6  PLT 281 232 [id number]   Basic Metabolic Panel  Recent Labs Lab 11/03/13 0914 11/04/13 0340 11/05/13 0350 11/06/13 0414  NA 134* 139 141 140  K 4.1 4.5 3.4* 4.0  CL 100 105 105 103  CO2 18* 19 20 21   GLUCOSE 102* 101* 120* 104*  BUN 36* 35* 32* 30*  CREATININE 3.38* 3.36* 3.39* 3.50*  CALCIUM 9.2 8.2* 8.4 8.9    Exam  Blood pressure 156/73, pulse 69, temperature 97.4 F (36.3 C), temperature source Oral, resp. rate 12, height 5\' 1"  (1.549 m), weight 59.9 kg (132 lb 0.9 oz), SpO2 99.00%. Alert, no distress, gen weakness No rash, cyanosis Sclera anicteric, throat clear No jvd Clear lungs bilat RRR no MRG Abd soft, nt/nd, no ascites Ext no  LE or UE edema Neuro no asterixis, Ox3  UA- >300 prot, no rbc, 3-6wbc CXR no acute disease   Assessment 1. CKD stage 4b, HTN'sive nephrosclerosis v other  2. HTN crisis, resolved back on meds and po lasix 3. [facility] 6/13 4. CAD w stent 5. HL  Plan- see above. Patient politely declined consideration of dialysis and renal follow up.  Her reasons are credible in my opinion.  No family is here and did not reach anyone by phone.  Would stop metoprolol as she is on labetalol and other po BP meds and po lasix. Palliative care consult may be helpful, for family in particular, but i did not approach this with patient. Will sign off, please call as needed.    Kelly Splinter MD (pgr) [phone]    ([phone] 11/06/2013, 10:02 AM

## 2013-11-06 NOTE — Progress Notes (Signed)
Subjective:  Patient says she is feeling better and has no complaints at the present time wants to know when she can go home. Very limited insight into her medical issues. No shortness of breath or chest pain.  Objective:  Vital Signs in the last 24 hours: BP 175/76  Pulse 64  Temp(Src) 97.2 F (36.2 C) (Oral)  Resp 16  Ht 5\' 1"  (1.549 m)  Wt 59.9 kg (132 lb 0.9 oz)  BMI 24.96 kg/m2  SpO2 99%  Physical Exam: Middle-aged black female in no acute distress Lungs:  Clear  Cardiac:  Regular rhythm, normal S1 and S2, no S3, S4 noted Abdomen:  Soft, nontender, no masses Extremities: 1+ edema present  Intake/Output from previous day: 01/17 0701 - 01/18 0700 In: 1069.7 [P.O.:720; I.V.:349.7] Out: 650 [Urine:650] Weight Filed Weights   11/03/13 1500 11/04/13 0400 11/05/13 0132  Weight: 58.8 kg (129 lb 10.1 oz) 59.5 kg (131 lb 2.8 oz) 59.9 kg (132 lb 0.9 oz)   Lab Results: Basic Metabolic Panel:  Recent Labs  11/05/13 0350 11/06/13 0414  NA 141 140  K 3.4* 4.0  CL 105 103  CO2 20 21  GLUCOSE 120* 104*  BUN 32* 30*  CREATININE 3.39* 3.50*   CBC:  Recent Labs  11/04/13 0340 11/05/13 0350  WBC 4.2 4.3  HGB 10.6* 10.5*  HCT 31.5* 31.8*  MCV 84.2 84.6  PLT 232 239   BNP    Component Value Date/Time   PROBNP 42680.0* 10/22/2013 0534   Telemetry: Sinus rhythm  Assessment/Plan: 1. Severe hypertensive heart disease with medical noncompliance 2. Coronary artery disease 3. Hypertensive renal disease 4. Recent flu  Recommendations:  Has continued severe hypertension. She has evidence of target organ damage in her heart and kidneys. I would suggest that you get the nephrologist to see her also to assist with blood pressure management. A diuretic-based regimen may be helpful in her case.     Kerry Hough  MD [facility]  11/06/2013, 8:39 AM

## 2013-11-06 NOTE — Progress Notes (Signed)
ANTIBIOTIC CONSULT NOTE - INITIAL  Pharmacy Consult for Levaquin  Indication: UTI   Allergies  Allergen Reactions  . Ampicillin Hives and Rash  . Penicillins Hives and Rash    Patient Measurements: Height: _0  (154.9 cm) Weight: 132 lb 0.9 oz (59.9 kg) IBW/kg (Calculated) : 47.8   Vital Signs: Temp: 97.4 F (36.3 C) (01/18 0855) Temp src: Oral (01/18 0855) BP: 163/68 mmHg (01/18 1000) Pulse Rate: 66 (01/18 1000) Intake/Output from previous day: 01/17 0701 - 01/18 0700 In: 1079.7 [P.O.:720; I.V.:359.7] Out: 650 [Urine:650] Intake/Output from this shift: Total I/O In: 120 [P.O.:100; I.V.:20] Out: -   Labs:  Recent Labs  11/04/13 0340 11/05/13 0350 11/06/13 0414  WBC 4.2 4.3  --   HGB 10.6* 10.5*  --   PLT 232 239  --   CREATININE 3.36* 3.39* 3.50*   Estimated Creatinine Clearance: 15.8 ml/min (by C-G formula based on Cr of 3.5). No results found for this basename: VANCOTROUGH, Corlis Leak, VANCORANDOM, GENTTROUGH, GENTPEAK, GENTRANDOM, TOBRATROUGH, TOBRAPEAK, TOBRARND, AMIKACINPEAK, AMIKACINTROU, AMIKACIN,  in the last 72 hours   Microbiology: Recent Results (from the past 720 hour(s))  MRSA PCR SCREENING     Status: None   Collection Time    10/20/13  9:45 PM      Result Value Range Status   MRSA by PCR NEGATIVE  NEGATIVE Final   Comment:            The GeneXpert MRSA Assay (FDA     approved for NASAL specimens     only), is one component of a     comprehensive MRSA colonization     surveillance program. It is not     intended to diagnose MRSA     infection nor to guide or     monitor treatment for     MRSA infections.  RESPIRATORY VIRUS PANEL     Status: Abnormal   Collection Time    11/03/13 10:36 AM      Result Value Range Status   Source - RVPAN NASAL SWAB   Corrected   Comment: CORRECTED ON 01/16 AT 2216: PREVIOUSLY REPORTED AS NASAL SWAB   Respiratory Syncytial Virus A NOT DETECTED   Final   Respiratory Syncytial Virus B NOT DETECTED   Final    Influenza A NOT DETECTED   Final   Influenza B NOT DETECTED   Final   Parainfluenza 1 NOT DETECTED   Final   Parainfluenza 2 DETECTED (*)  Final   Parainfluenza 3 NOT DETECTED   Final   Metapneumovirus NOT DETECTED   Final   Rhinovirus NOT DETECTED   Final   Adenovirus NOT DETECTED   Final   Influenza A H1 NOT DETECTED   Final   Influenza A H3 NOT DETECTED   Final   Comment: (NOTE)           Normal Reference Range for each Analyte: NOT DETECTED     Testing performed using the Luminex xTAG Respiratory Viral Panel test     kit.     This test was developed and its performance characteristics determined     by [facility]. It has not been cleared or approved by the [place]     Food and Drug Administration. This test is used for clinical purposes.     It should not be regarded as investigational or for research. This     laboratory is certified under the [place] (  CLIA) as qualified to perform high complexity     clinical laboratory testing.     Performed at [facility]     Status: None   Collection Time    11/03/13 11:27 AM      Result Value Range Status   Specimen Description URINE, CLEAN CATCH   Final   Special Requests NONE   Final   Culture  Setup Time     Final   Value: 11/03/2013 14:30     Performed at [facility] Count     Final   Value: >=100,000 COLONIES/ML     Performed at [facility]   Culture     Final   Value: Multiple bacterial morphotypes present, none predominant. Suggest appropriate recollection if clinically indicated.     Performed at [facility]   Report Status 11/04/2013 FINAL   Final  MRSA PCR SCREENING     Status: None   Collection Time    11/03/13  3:27 PM      Result Value Range Status   MRSA by PCR NEGATIVE  NEGATIVE Final   Comment:            The GeneXpert MRSA Assay (FDA     approved for NASAL specimens     only), is one component of a      comprehensive MRSA colonization     surveillance program. It is not     intended to diagnose MRSA     infection nor to guide or     monitor treatment for     MRSA infections.    Medical History: Past Medical History  Diagnosis Date  . HTN (hypertension)     Resistant  . Hypercholesterolemia   . Tobacco abuse     Resumed smoking half a pack a day. She was a smoker in the past and states she was abl eto stop in the past using nicotine patches  . Coronary atherosclerosis of native coronary artery     STEMI Feb 2009 - 70% distal LAD lesion c/w plaque rupture, DES distal RCA  . CKD (chronic kidney disease) stage 3, GFR 30-59 ml/min     Most recent creatinine 1.4 in 3/10  . Diastolic heart failure     LVEF 60-65% with grade 2 diastolic dysfunction - July 2014  . Subarachnoid hemorrhage     03/2012  . Aneurysm     Medications:  Scheduled:  . amLODipine  10 mg Oral Daily  . atorvastatin  80 mg Oral Daily  . cloNIDine  0.3 mg Oral TID  . feeding supplement (ENSURE COMPLETE)  237 mL Oral BID BM  . furosemide  80 mg Oral BID  . heparin  5,000 Units Subcutaneous Q8H  . isosorbide mononitrate  60 mg Oral Daily  . labetalol  300 mg Oral TID  . levETIRAcetam  500 mg Oral BID  . levofloxacin (LEVAQUIN) IV  500 mg Intravenous Q48H  . minoxidil  5 mg Oral BID  . sodium bicarbonate  650 mg Oral TID  . sodium chloride  3 mL Intravenous Q12H   Infusions:  . nitroGLYCERIN Stopped (11/05/13 2300)   PRN:  Assessment: D#4 Levaquin now 500 mg IV q48h for UTI.  Urine culture from 1/15 with multiple bacteria present, no re-collect, treating empirically w/ levaquin. Patients renal function continues to worsen, CrCl today 15 ml/min.  Patient being followed by nephrology, patient declines HD.  Levaquin dose remains appropriate  for CrCl of 15 ml/min .   Goal of Therapy:  Levaquin per renal function   Plan:  1.) Will reduce levaquin dose to 250 mg Q48h for UTI 2.) Continue to monitor renal  function    Adonia Porada, Gaye Alken PharmD Pager #: [phone] 1:02 PM 11/06/2013

## 2013-11-06 NOTE — Progress Notes (Signed)
PROGRESS NOTE  [name] YNX:[id number] DOB: May 27, 1961 DOA: 11/03/2013 PCP: No PCP Per Patient  Assessment/Plan: HTN Urgency - patient initially admitted to SDU on nitro drip; her oral medications were started with poor initial control. Cardiology has been consulted and recommended switching hydralazine to minoxidil with subsequent improvement in her BP readings. While still hypertensive by criteria, her systolic BP is in the 984-210 which is a reasonable goal for now. Patient transferred to floor on 1/18.  - currently on 300 Labetalol TID, Imdur 60, Norvasc 10, Lasix 80 BID, Clonidine 0.3 mg TID, Minoxidil 5 BID  Cough - parainfluenza 2 + - continue empiric Levofloxacin for presumed bronchitis. UTI - no dysuria, but overall weakness and somnolence yesterday.  - Start Levofloxacin. Urine cultures 10^5 colonies, no predominant bacteria. Acute on chronic diastolic CHF - restart lasix  Seizure disorder - continue AED  CKD stage IV - at baseline, she is non compliant with nephrology follow up as an outpatient.  - Dr. Jonnie Finner discussed with patient today about the future and dialysis needs. She is not willing to undergo dialysis and has expressed that to me as well.   Medication non compliance - this is a big factor in her readmission profile in the past few months. She also does not follow regularly with her outpatient team.  CAD  Diet: heart Fluids: none DVT Prophylaxis: heparin  Code Status: Full Family Communication: none  Disposition Plan: home when ready, floor transfer today  Consultants:  Cardiology  Nephrology   Procedures:  none   Antibiotics - Levofloxacin 1/15 >>  HPI/Subjective: - feeling better, asking about d/c home  Objective: Filed Vitals:   11/06/13 0000 11/06/13 0200 11/06/13 0400 11/06/13 0600  BP: 169/74 176/76 165/72 175/76  Pulse: 67 63 63 64  Temp: 97 F (36.1 C)  97.2 F (36.2 C)   TempSrc: Oral  Oral   Resp: '15 16 18 16  ' Height:        Weight:      SpO2: 98% 100% 98% 99%    Intake/Output Summary (Last 24 hours) at 11/06/13 0812 Last data filed at 11/06/13 0100  Gross per 24 hour  Intake 1042.7 ml  Output    650 ml  Net  392.7 ml   Filed Weights   11/03/13 1500 11/04/13 0400 11/05/13 0132  Weight: 58.8 kg (129 lb 10.1 oz) 59.5 kg (131 lb 2.8 oz) 59.9 kg (132 lb 0.9 oz)    Exam:  General:  NAD  Cardiovascular: regular rate and rhythm, without MRG  Respiratory: good air movement, clear to auscultation throughout, no wheezing, ronchi or rales  Abdomen: soft, not tender to palpation, positive bowel sounds  MSK: no peripheral edema  Neuro: non focal  Data Reviewed: Basic Metabolic Panel:  Recent Labs Lab 11/03/13 0914 11/04/13 0340 11/05/13 0350 11/06/13 0414  NA 134* 139 141 140  K 4.1 4.5 3.4* 4.0  CL 100 105 105 103  CO2 18* '19 20 21  ' GLUCOSE 102* 101* 120* 104*  BUN 36* 35* 32* 30*  CREATININE 3.38* 3.36* 3.39* 3.50*  CALCIUM 9.2 8.2* 8.4 8.9   Liver Function Tests:  Recent Labs Lab 11/03/13 0914  AST 14  ALT 21  ALKPHOS 88  BILITOT 0.5  PROT 6.8  ALBUMIN 3.1*   CBC:  Recent Labs Lab 11/03/13 0743 11/04/13 0340 11/05/13 0350  WBC 6.1 4.2 4.3  NEUTROABS 3.3  --   --   HGB 13.3 10.6* 10.5*  HCT 39.5 31.5* 31.8*  MCV 84.8 84.2 84.6  PLT 281 232 239   Cardiac Enzymes:  Recent Labs Lab 11/03/13 0914  TROPONINI <0.30   BNP (last 3 results)  Recent Labs  05/07/13 0500 10/13/13 2210 10/22/13 0534  PROBNP 20527.0* >70000.0* 42680.0*    Recent Results (from the past 240 hour(s))  RESPIRATORY VIRUS PANEL     Status: Abnormal   Collection Time    11/03/13 10:36 AM      Result Value Range Status   Source - RVPAN NASAL SWAB   Corrected   Comment: CORRECTED ON 01/16 AT 2216: PREVIOUSLY REPORTED AS NASAL SWAB   Respiratory Syncytial Virus A NOT DETECTED   Final   Respiratory Syncytial Virus B NOT DETECTED   Final   Influenza A NOT DETECTED   Final   Influenza B  NOT DETECTED   Final   Parainfluenza 1 NOT DETECTED   Final   Parainfluenza 2 DETECTED (*)  Final   Parainfluenza 3 NOT DETECTED   Final   Metapneumovirus NOT DETECTED   Final   Rhinovirus NOT DETECTED   Final   Adenovirus NOT DETECTED   Final   Influenza A H1 NOT DETECTED   Final   Influenza A H3 NOT DETECTED   Final   Comment: (NOTE)           Normal Reference Range for each Analyte: NOT DETECTED     Testing performed using the Luminex xTAG Respiratory Viral Panel test     kit.     This test was developed and its performance characteristics determined     by [facility]. It has not been cleared or approved by the [place]     Food and Drug Administration. This test is used for clinical purposes.     It should not be regarded as investigational or for research. This     laboratory is certified under the [place] (CLIA) as qualified to perform high complexity     clinical laboratory testing.     Performed at [facility]     Status: None   Collection Time    11/03/13 11:27 AM      Result Value Range Status   Specimen Description URINE, CLEAN CATCH   Final   Special Requests NONE   Final   Culture  Setup Time     Final   Value: 11/03/2013 14:30     Performed at [facility]     Final   Value: >=100,000 COLONIES/ML     Performed at [facility]   Culture     Final   Value: Multiple bacterial morphotypes present, none predominant. Suggest appropriate recollection if clinically indicated.     Performed at [facility]   Report Status 11/04/2013 FINAL   Final  MRSA PCR SCREENING     Status: None   Collection Time    11/03/13  3:27 PM      Result Value Range Status   MRSA by PCR NEGATIVE  NEGATIVE Final   Comment:            The GeneXpert MRSA Assay (FDA     approved for NASAL specimens     only), is one component of a     comprehensive MRSA colonization      surveillance program. It is not     intended to diagnose MRSA  infection nor to guide or     monitor treatment for     MRSA infections.     Studies: No results found.  Scheduled Meds: . amLODipine  10 mg Oral Daily  . atorvastatin  80 mg Oral Daily  . cloNIDine  0.3 mg Oral TID  . feeding supplement (ENSURE COMPLETE)  237 mL Oral BID BM  . furosemide  80 mg Oral BID  . heparin  5,000 Units Subcutaneous Q8H  . isosorbide mononitrate  60 mg Oral Daily  . labetalol  300 mg Oral TID  . levETIRAcetam  500 mg Oral BID  . levofloxacin (LEVAQUIN) IV  500 mg Intravenous Q48H  . metoprolol  50 mg Oral BID  . minoxidil  5 mg Oral BID  . sodium bicarbonate  650 mg Oral TID  . sodium chloride  3 mL Intravenous Q12H   Continuous Infusions: . nitroGLYCERIN Stopped (11/05/13 2300)    Principal Problem:   Hypertensive urgency Active Problems:   Acute on chronic diastolic heart failure   H/O noncompliance with medical treatment, presenting hazards to health   HTN (hypertension), malignant   CKD (chronic kidney disease), stage IV   Hypertensive heart disease   Acute-on-chronic renal failure   Accelerated hypertension   Seizure disorder   Non compliance w medication regimen   Protein-calorie malnutrition, severe  Time spent: [place], MD [facility] Pager [phone]. If 7 PM - 7 AM, please contact night-coverage at [email or web address], password [facility] 11/06/2013, 8:12 AM  LOS: 3 days

## 2013-11-07 LAB — BASIC METABOLIC PANEL
BUN: 28 mg/dL — ABNORMAL HIGH (ref 6–23)
CALCIUM: 8.9 mg/dL (ref 8.4–10.5)
CO2: 24 mEq/L (ref 19–32)
Chloride: 99 mEq/L (ref 96–112)
Creatinine, Ser: 3.81 mg/dL — ABNORMAL HIGH (ref 0.50–1.10)
GFR calc non Af Amer: 13 mL/min — ABNORMAL LOW (ref >90)
GFR, EST AFRICAN AMERICAN: 15 mL/min — AB (ref >90)
Glucose, Bld: 118 mg/dL — ABNORMAL HIGH (ref 70–99)
Potassium: 4.4 mEq/L (ref 3.7–5.3)
SODIUM: 138 meq/L (ref 137–147)

## 2013-11-07 LAB — CBC
HCT: 35.8 % — ABNORMAL LOW (ref 36.0–46.0)
Hemoglobin: 11.8 g/dL — ABNORMAL LOW (ref 12.0–15.0)
MCH: 28.1 pg (ref 26.0–34.0)
MCHC: 33 g/dL (ref 30.0–36.0)
MCV: 85.2 fL (ref 78.0–100.0)
PLATELETS: 223 10*3/uL (ref 150–400)
RBC: 4.2 MIL/uL (ref 3.87–5.11)
RDW: 16.6 % — AB (ref 11.5–15.5)
WBC: 4.6 10*3/uL (ref 4.0–10.5)

## 2013-11-07 MED ORDER — LEVOFLOXACIN 250 MG PO TABS: 250.0000 mg | ORAL_TABLET | ORAL | Status: DC

## 2013-11-07 MED ORDER — ISOSORBIDE MONONITRATE ER 30 MG PO TB24: 60.0000 mg | ORAL_TABLET | Freq: Every day | ORAL | Status: DC

## 2013-11-07 MED ORDER — AMLODIPINE BESYLATE 10 MG PO TABS: 10.0000 mg | ORAL_TABLET | Freq: Every day | ORAL | Status: DC

## 2013-11-07 MED ORDER — CLONIDINE HCL 0.3 MG PO TABS: 0.3000 mg | ORAL_TABLET | Freq: Three times a day (TID) | ORAL | Status: DC

## 2013-11-07 MED ORDER — LABETALOL HCL 200 MG PO TABS: 300.0000 mg | ORAL_TABLET | Freq: Three times a day (TID) | ORAL | Status: DC

## 2013-11-07 MED ORDER — MINOXIDIL 2.5 MG PO TABS: 5.0000 mg | ORAL_TABLET | Freq: Two times a day (BID) | ORAL | Status: DC

## 2013-11-07 MED ORDER — FUROSEMIDE 80 MG PO TABS: 80.0000 mg | ORAL_TABLET | Freq: Two times a day (BID) | ORAL | Status: DC

## 2013-11-07 NOTE — Discharge Summary (Addendum)
Physician Discharge Summary  [name] [id number]:[id number] DOB: 24-Feb-1961 DOA: 11/03/2013  PCP: No PCP Per Patient  Admit date: 11/03/2013 Discharge date: 11/07/2013  Time spent: 35 minutes  Recommendations for Outpatient Follow-up:  1. Follow up with PCP in 1 week 2. Follow up with nephrology in 1-2 weeks   Recommendations for primary care physician for things to follow:  BP monitoring, repeat BMP  Discharge Diagnoses:  Principal Problem:   HTN (hypertension), malignant Active Problems:   Acute on chronic diastolic heart failure   H/O noncompliance with medical treatment, presenting hazards to health   CKD (chronic kidney disease), stage IV   Hypertensive heart disease   Acute-on-chronic renal failure   Hypertensive urgency   Seizure disorder   Protein-calorie malnutrition, severe   Hypertensive renal disease  Discharge Condition: stable, but with advanced renal failure and refusing dialysis   Diet recommendation: renal  Filed Weights   11/05/13 0132 11/07/13 0400 11/07/13 3009  Weight: 59.9 kg (132 lb 0.9 oz) 58.6 kg (129 lb 3 oz) 56 kg (123 lb 7.3 oz)   History of present illness:  [name] is a 53 y.o. female has a past medical history significant for CKD, uncontrolled HTN, medication non compliance with recurrent hospitalizations for hypertensive urgency, diastolic CHF comes with generalized weakness and feeling poorly for 1-2 days. Endorses mild cough, denies flu-like symptoms. She has nausea without vomiting. Denies fevers. She states that she missed her HTN meds yesterday because she felt tired and wanted to sleep. Her daughter is managing her medications at home. Patient seems to be very flat and displays poor insight into her disease process. Currently she denies chest pain, SOB, headache. Had mild chest pain at home with coughing. In the ED blood pressure elevated > 233 systolic, nitro gtt started. [facility] hospitalist asked to admit for HTN urgency.   Hospital  Course:  HTN Urgency - patient was initially admitted to the step down unit on a nitroglycerin drip. Her oral medications were started with initial poor control as we have been having difficulties weaning off her drip. Her home medications were adjusted and her labetalol was increased and her hydralazine was changed to Monixidil. Cardiology was consulted and assisted with changes. She was off of her nitro drip on 1/18 and was transferred to telemetry floor, her BP has remained stable and was discharged home on 1/19. Current regimen is as below.   Cough - parainfluenza 2 +. She was on empiric Levofloxacin for presumed bronchitis, will continue that at home for 2 additional doses.  UTI - Urine cultures 10^5 colonies, no predominant bacteria.  Acute on chronic diastolic CHF - restart lasix  Seizure disorder - continue AED  CKD stage IV - at baseline, she is non compliant with nephrology follow up as an outpatient. Dr. Jonnie Finner discussed with patient today about the future and dialysis needs. She is not willing to undergo dialysis and has expressed that to me as well. This is somewhat difficult for her family to cope with, but patient was very clear to Nephrology and myself that after seeing her father going through dialysis and seeing his lifestyle she is not willing to go through that. She wants to live as much as she possibly can, but without dialysis.  Medication non compliance - this is a big factor in her readmission profile in the past few months. She also does not follow regularly with her outpatient team. Counseled that controlling her blood pressure will protect what is left of her  kidneys and may prevent future readmissions.  CAD  Procedures:  none   Consultations:  Nephrology  Cardiology  Discharge Exam: Filed Vitals:   11/07/13 0300 11/07/13 0400 11/07/13 0500 11/07/13 0632  BP:  128/57  129/67  Pulse: 68 67 70 66  Temp:  97.4 F (36.3 C)  97.4 F (36.3 C)  TempSrc:  Oral  Oral    Resp: '16 19 20 20  ' Height:      Weight:  58.6 kg (129 lb 3 oz)  56 kg (123 lb 7.3 oz)  SpO2: 99% 99% 99% 100%   General: NAD Cardiovascular: RRR Respiratory: CTA biL  Discharge Instructions    Medication List    STOP taking these medications       hydrALAZINE 100 MG tablet  Commonly known as:  APRESOLINE     losartan 50 MG tablet  Commonly known as:  COZAAR     metoprolol 100 MG tablet  Commonly known as:  LOPRESSOR      TAKE these medications       amLODipine 10 MG tablet  Commonly known as:  NORVASC  Take 1 tablet (10 mg total) by mouth daily.     atorvastatin 80 MG tablet  Commonly known as:  LIPITOR  Take 80 mg by mouth daily.     calcitRIOL 0.25 MCG capsule  Commonly known as:  ROCALTROL  Take 1 capsule (0.25 mcg total) by mouth every Monday, Wednesday, and Friday.     camphor-menthol lotion  Commonly known as:  SARNA  Apply 1 application topically as needed for itching.     cloNIDine 0.3 MG tablet  Commonly known as:  CATAPRES  Take 1 tablet (0.3 mg total) by mouth 3 (three) times daily.     furosemide 80 MG tablet  Commonly known as:  LASIX  Take 1 tablet (80 mg total) by mouth 2 (two) times daily.     isosorbide mononitrate 30 MG 24 hr tablet  Commonly known as:  IMDUR  Take 2 tablets (60 mg total) by mouth daily.     labetalol 200 MG tablet  Commonly known as:  NORMODYNE  Take 1.5 tablets (300 mg total) by mouth 3 (three) times daily.     levETIRAcetam 500 MG tablet  Commonly known as:  KEPPRA  Take 500 mg by mouth 2 (two) times daily.     levofloxacin 250 MG tablet  Commonly known as:  LEVAQUIN  Take 1 tablet (250 mg total) by mouth every other day.     minoxidil 2.5 MG tablet  Commonly known as:  LONITEN  Take 2 tablets (5 mg total) by mouth 2 (two) times daily.     nitroGLYCERIN 0.4 MG SL tablet  Commonly known as:  NITROSTAT  Place 1 tablet (0.4 mg total) under the tongue every 5 (five) minutes as needed for chest pain.      omeprazole 20 MG capsule  Commonly known as:  PRILOSEC  Take 20 mg by mouth daily.     sodium bicarbonate 650 MG tablet  Take 1 tablet (650 mg total) by mouth 3 (three) times daily.     tamsulosin 0.4 MG Caps capsule  Commonly known as:  FLOMAX  Take 0.4 mg by mouth daily.           Follow-up Information   Follow up with PCP. Schedule an appointment as soon as possible for a visit in 1 week.      Follow up with COLADONATO,JOSEPH A, MD. Schedule  an appointment as soon as possible for a visit in 1 week.   Specialty:  Nephrology   Contact information:   [address] [place] [id number] [phone]       The results of significant diagnostics from this hospitalization (including imaging, microbiology, ancillary and laboratory) are listed below for reference.    Significant Diagnostic Studies: Dg Chest 2 View  11/03/2013   CLINICAL DATA:  Smoker, cough, congestion, shortness of breath, chest pain  EXAM: CHEST  2 VIEW  COMPARISON:  10/20/2013  FINDINGS: Moderate cardiac enlargement. Mild vascular congestion less prominent when compared to the prior study. There is mild bilateral lower lobe atelectasis. There is no evidence of pulmonary edema or consolidation. There are no pleural effusions. Right sided VP shunt tubing is again identified.  IMPRESSION: Stable cardiac enlargement.  No acute findings.   Electronically Signed   By: Skipper Cliche M.D.   On: 11/03/2013 08:08   Dg Chest 2 View  10/20/2013   CLINICAL DATA:  Cough, congestion, fever  EXAM: CHEST  2 VIEW  COMPARISON:  10/13/2013  FINDINGS: Moderate cardiac enlargement. VP shunt tubing projects over the right thorax. There is moderate vascular congestion, which represents a change from the prior study. The perihilar vessels are mildly indistinct.  IMPRESSION: Congestive heart failure with mild pulmonary edema. Vascular congestion is worse when compared to the prior study.   Electronically Signed    By: Skipper Cliche M.D.   On: 10/20/2013 21:02   Ct Head Wo Contrast  11/03/2013   CLINICAL DATA:  Altered mental status.  EXAM: CT HEAD WITHOUT CONTRAST  TECHNIQUE: Contiguous axial images were obtained from the base of the skull through the vertex without intravenous contrast.  COMPARISON:  CT scan of September 25, 2013.  FINDINGS: Status post right temporal craniotomy. Ventriculoperitoneal shunt is seen entering right frontal skull with tip in left lateral ventricle which is unchanged in position compared to prior exam. 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 Ventricular size is stable compared to prior exam. No mass effect or midline shift is noted. There is no evidence of mass lesion, acute hemorrhage or acute infarction.  IMPRESSION: Right frontal ventriculoperitoneal shunt is unchanged in position. Ventricular size is within normal limits and unchanged compared to prior exam. No acute intracranial abnormality is noted.   Electronically Signed   By: Sabino Dick M.D.   On: 11/03/2013 08:28   Dg Chest Portable 1 View  10/13/2013   CLINICAL DATA:  Short of breath.  EXAM: PORTABLE CHEST - 1 VIEW  COMPARISON:  09/25/2013.  FINDINGS: Cardiomegaly. VP shunt tubing projects over the right chest. Interstitial pulmonary edema is present. Pulmonary vascular congestion. Monitoring leads project over the chest. No focal consolidation. No definite effusion on this single frontal view.  IMPRESSION: Mild CHF.   Electronically Signed   By: Dereck Ligas M.D.   On: 10/13/2013 22:44    Microbiology: Recent Results (from the past 240 hour(s))  RESPIRATORY VIRUS PANEL     Status: Abnormal   Collection Time    11/03/13 10:36 AM      Result Value Range Status   Source - RVPAN NASAL SWAB   Corrected   Comment: CORRECTED ON 01/16 AT 2216: PREVIOUSLY REPORTED AS NASAL SWAB   Respiratory Syncytial Virus A NOT DETECTED   Final   Respiratory Syncytial Virus B NOT DETECTED   Final   Influenza A NOT  DETECTED   Final   Influenza B  NOT DETECTED   Final   Parainfluenza 1 NOT DETECTED   Final   Parainfluenza 2 DETECTED (*)  Final   Parainfluenza 3 NOT DETECTED   Final   Metapneumovirus NOT DETECTED   Final   Rhinovirus NOT DETECTED   Final   Adenovirus NOT DETECTED   Final   Influenza A H1 NOT DETECTED   Final   Influenza A H3 NOT DETECTED   Final   Comment: (NOTE)           Normal Reference Range for each Analyte: NOT DETECTED     Testing performed using the Luminex xTAG Respiratory Viral Panel test     kit.     This test was developed and its performance characteristics determined     by [facility]. It has not been cleared or approved by the [place]     Food and Drug Administration. This test is used for clinical purposes.     It should not be regarded as investigational or for research. This     laboratory is certified under the [place] (CLIA) as qualified to perform high complexity     clinical laboratory testing.     Performed at [facility]     Status: None   Collection Time    11/03/13 11:27 AM      Result Value Range Status   Specimen Description URINE, CLEAN CATCH   Final   Special Requests NONE   Final   Culture  Setup Time     Final   Value: 11/03/2013 14:30     Performed at [facility]     Final   Value: >=100,000 COLONIES/ML     Performed at [facility]   Culture     Final   Value: Multiple bacterial morphotypes present, none predominant. Suggest appropriate recollection if clinically indicated.     Performed at [facility]   Report Status 11/04/2013 FINAL   Final  MRSA PCR SCREENING     Status: None   Collection Time    11/03/13  3:27 PM      Result Value Range Status   MRSA by PCR NEGATIVE  NEGATIVE Final   Comment:            The GeneXpert MRSA Assay (FDA     approved for NASAL specimens     only), is one component of a     comprehensive  MRSA colonization     surveillance program. It is not     intended to diagnose MRSA     infection nor to guide or     monitor treatment for     MRSA infections.     Labs: Basic Metabolic Panel:  Recent Labs Lab 11/03/13 0914 11/04/13 0340 11/05/13 0350 11/06/13 0414 11/07/13 0400  NA 134* 139 141 140 138  K 4.1 4.5 3.4* 4.0 4.4  CL 100 105 105 103 99  CO2 18* '19 20 21 24  ' GLUCOSE 102* 101* 120* 104* 118*  BUN 36* 35* 32* 30* 28*  CREATININE 3.38* 3.36* 3.39* 3.50* 3.81*  CALCIUM 9.2 8.2* 8.4 8.9 8.9   Liver Function Tests:  Recent Labs Lab 11/03/13 0914  AST 14  ALT 21  ALKPHOS 88  BILITOT 0.5  PROT 6.8  ALBUMIN 3.1*   CBC:  Recent Labs Lab 11/03/13 0743 11/04/13 0340 11/05/13 0350 11/07/13 0400  WBC 6.1 4.2 4.3 4.6  NEUTROABS 3.3  --   --   --   HGB 13.3 10.6* 10.5* 11.8*  HCT 39.5 31.5* 31.8* 35.8*  MCV 84.8 84.2 84.6 85.2  PLT 281 232 239 223   Cardiac Enzymes:  Recent Labs Lab 11/03/13 0914  TROPONINI <0.30   BNP: BNP (last 3 results)  Recent Labs  05/07/13 0500 10/13/13 2210 10/22/13 0534  PROBNP 20527.0* >70000.0* 42680.0*   Signed:  Marzetta Board  [facility] 11/07/2013, 5:35 PM

## 2013-11-07 NOTE — Discharge Instructions (Signed)
You were cared for by a hospitalist during your hospital stay. If you have any questions about your discharge medications or the care you received while you were in the hospital after you are discharged, you can call the unit and asked to speak with the hospitalist on call if the hospitalist that took care of you is not available. Once you are discharged, your primary care physician will handle any further medical issues. Please note that NO REFILLS for any discharge medications will be authorized once you are discharged, as it is imperative that you return to your primary care physician (or establish a relationship with a primary care physician if you do not have one) for your aftercare needs so that they can reassess your need for medications and monitor your lab values.     If you do not have a primary care physician, you can call [phone] for a physician referral.  Follow with Primary MD in 5-7 days  Follow with Nephrology in 1 week  Get CBC, CMP checked by your doctor and again as further instructed.  Get a 2 view Chest X ray done next visit if you had Pneumonia of Lung problems at the [facility] reviewed and adjusted.  Please request your Prim.MD to go over all Hospital Tests and Procedure/Radiological results at the follow up, please get all Hospital records sent to your Prim MD by signing hospital release before you go home.  Activity: As tolerated with Full fall precautions use walker/cane & assistance as needed  Diet: renal  For Heart failure patients - Check your Weight same time everyday, if you gain over 2 pounds, or you develop in leg swelling, experience more shortness of breath or chest pain, call your Primary MD immediately. Follow Cardiac Low Salt Diet and 1.8 lit/day fluid restriction.  Disposition Home  If you experience worsening of your admission symptoms, develop shortness of breath, life threatening emergency, suicidal or homicidal thoughts you must seek  medical attention immediately by calling 911 or calling your MD immediately  if symptoms less severe.  You Must read complete instructions/literature along with all the possible adverse reactions/side effects for all the Medicines you take and that have been prescribed to you. Take any new Medicines after you have completely understood and accpet all the possible adverse reactions/side effects.   Do not drive and provide baby sitting services if your were admitted for syncope or siezures until you have seen by Primary MD or a Neurologist and advised to do so again.  Do not drive when taking Pain medications.   Do not take more than prescribed Pain, Sleep and Anxiety Medications  Special Instructions: If you have smoked or chewed Tobacco  in the last 2 yrs please stop smoking, stop any regular Alcohol  and or any Recreational drug use.  Wear Seat belts while driving.

## 2013-11-07 NOTE — Progress Notes (Signed)
Patient transferred to the floor from stepdown at 06:20 this am.  Patient placed in rooms, VS performed and droplet isolation continued.  Will continue to monitor patient.

## 2013-11-07 NOTE — Progress Notes (Signed)
Patient is discharged. Attempted to reach family members that were listed in chart with only one number working. Left a message for daughter to call RN back. J.Jhade Berko, RN

## 2013-11-08 ENCOUNTER — Inpatient Hospital Stay (HOSPITAL_COMMUNITY)
Admission: EM | Admit: 2013-11-08 | Discharge: 2013-11-15 | DRG: 064 | Disposition: A | Discharge status: Skilled Nursing Facility | Payer: Medicare Other | Attending: Internal Medicine | Admitting: Internal Medicine

## 2013-11-08 ENCOUNTER — Emergency Department (HOSPITAL_COMMUNITY): Payer: Medicare Other

## 2013-11-08 ENCOUNTER — Encounter (HOSPITAL_COMMUNITY): Payer: Self-pay | Admitting: Emergency Medicine

## 2013-11-08 DIAGNOSIS — I252 Old myocardial infarction: Secondary | ICD-10-CM

## 2013-11-08 DIAGNOSIS — T68XXXA Hypothermia, initial encounter: Secondary | ICD-10-CM | POA: Diagnosis present

## 2013-11-08 DIAGNOSIS — E7800 Pure hypercholesterolemia, unspecified: Secondary | ICD-10-CM | POA: Diagnosis present

## 2013-11-08 DIAGNOSIS — N186 End stage renal disease: Secondary | ICD-10-CM | POA: Diagnosis present

## 2013-11-08 DIAGNOSIS — I959 Hypotension, unspecified: Secondary | ICD-10-CM | POA: Diagnosis present

## 2013-11-08 DIAGNOSIS — Z79899 Other long term (current) drug therapy: Secondary | ICD-10-CM

## 2013-11-08 DIAGNOSIS — N184 Chronic kidney disease, stage 4 (severe): Secondary | ICD-10-CM | POA: Diagnosis present

## 2013-11-08 DIAGNOSIS — Z8679 Personal history of other diseases of the circulatory system: Secondary | ICD-10-CM

## 2013-11-08 DIAGNOSIS — I6350 Cerebral infarction due to unspecified occlusion or stenosis of unspecified cerebral artery: Principal | ICD-10-CM | POA: Diagnosis present

## 2013-11-08 DIAGNOSIS — I639 Cerebral infarction, unspecified: Secondary | ICD-10-CM

## 2013-11-08 DIAGNOSIS — R4182 Altered mental status, unspecified: Secondary | ICD-10-CM | POA: Insufficient documentation

## 2013-11-08 DIAGNOSIS — E785 Hyperlipidemia, unspecified: Secondary | ICD-10-CM | POA: Diagnosis present

## 2013-11-08 DIAGNOSIS — R4701 Aphasia: Secondary | ICD-10-CM | POA: Diagnosis present

## 2013-11-08 DIAGNOSIS — G9340 Encephalopathy, unspecified: Secondary | ICD-10-CM | POA: Diagnosis present

## 2013-11-08 DIAGNOSIS — I129 Hypertensive chronic kidney disease with stage 1 through stage 4 chronic kidney disease, or unspecified chronic kidney disease: Secondary | ICD-10-CM | POA: Diagnosis present

## 2013-11-08 DIAGNOSIS — Z9861 Coronary angioplasty status: Secondary | ICD-10-CM

## 2013-11-08 DIAGNOSIS — I6340 Cerebral infarction due to embolism of unspecified cerebral artery: Secondary | ICD-10-CM

## 2013-11-08 DIAGNOSIS — Z8673 Personal history of transient ischemic attack (TIA), and cerebral infarction without residual deficits: Secondary | ICD-10-CM

## 2013-11-08 DIAGNOSIS — I509 Heart failure, unspecified: Secondary | ICD-10-CM | POA: Diagnosis present

## 2013-11-08 DIAGNOSIS — I701 Atherosclerosis of renal artery: Secondary | ICD-10-CM

## 2013-11-08 DIAGNOSIS — I2510 Atherosclerotic heart disease of native coronary artery without angina pectoris: Secondary | ICD-10-CM | POA: Diagnosis present

## 2013-11-08 DIAGNOSIS — F17200 Nicotine dependence, unspecified, uncomplicated: Secondary | ICD-10-CM | POA: Diagnosis present

## 2013-11-08 DIAGNOSIS — I10 Essential (primary) hypertension: Secondary | ICD-10-CM | POA: Diagnosis present

## 2013-11-08 LAB — COMPREHENSIVE METABOLIC PANEL
ALBUMIN: 2.9 g/dL — AB (ref 3.5–5.2)
ALT: 19 U/L (ref 0–35)
AST: 34 U/L (ref 0–37)
Alkaline Phosphatase: 107 U/L (ref 39–117)
BUN: 33 mg/dL — ABNORMAL HIGH (ref 6–23)
CHLORIDE: 101 meq/L (ref 96–112)
CO2: 24 mEq/L (ref 19–32)
CREATININE: 3.85 mg/dL — AB (ref 0.50–1.10)
Calcium: 9.3 mg/dL (ref 8.4–10.5)
GFR calc Af Amer: 15 mL/min — ABNORMAL LOW (ref >90)
GFR calc non Af Amer: 13 mL/min — ABNORMAL LOW (ref >90)
Glucose, Bld: 93 mg/dL (ref 70–99)
Potassium: 5.2 mEq/L (ref 3.7–5.3)
Sodium: 141 mEq/L (ref 137–147)
TOTAL PROTEIN: 6.8 g/dL (ref 6.0–8.3)
Total Bilirubin: 0.3 mg/dL (ref 0.3–1.2)

## 2013-11-08 LAB — CBC WITH DIFFERENTIAL/PLATELET
BASOS ABS: 0 10*3/uL (ref 0.0–0.1)
Basophils Relative: 0 % (ref 0–1)
EOS ABS: 0.1 10*3/uL (ref 0.0–0.7)
Eosinophils Relative: 1 % (ref 0–5)
HCT: 37.9 % (ref 36.0–46.0)
Hemoglobin: 13.1 g/dL (ref 12.0–15.0)
LYMPHS ABS: 0.8 10*3/uL (ref 0.7–4.0)
Lymphocytes Relative: 14 % (ref 12–46)
MCH: 28.8 pg (ref 26.0–34.0)
MCHC: 34.6 g/dL (ref 30.0–36.0)
MCV: 83.3 fL (ref 78.0–100.0)
MONOS PCT: 6 % (ref 3–12)
Monocytes Absolute: 0.4 10*3/uL (ref 0.1–1.0)
Neutro Abs: 4.7 10*3/uL (ref 1.7–7.7)
Neutrophils Relative %: 79 % — ABNORMAL HIGH (ref 43–77)
Platelets: 291 10*3/uL (ref 150–400)
RBC: 4.55 MIL/uL (ref 3.87–5.11)
RDW: 16.2 % — ABNORMAL HIGH (ref 11.5–15.5)
WBC: 6 10*3/uL (ref 4.0–10.5)

## 2013-11-08 LAB — RAPID URINE DRUG SCREEN, HOSP PERFORMED
AMPHETAMINES: NOT DETECTED
BARBITURATES: NOT DETECTED
Benzodiazepines: NOT DETECTED
COCAINE: NOT DETECTED
Opiates: NOT DETECTED
TETRAHYDROCANNABINOL: NOT DETECTED

## 2013-11-08 LAB — URINALYSIS, ROUTINE W REFLEX MICROSCOPIC
BILIRUBIN URINE: NEGATIVE
Glucose, UA: NEGATIVE mg/dL
HGB URINE DIPSTICK: NEGATIVE
KETONES UR: NEGATIVE mg/dL
Leukocytes, UA: NEGATIVE
NITRITE: NEGATIVE
PROTEIN: NEGATIVE mg/dL
SPECIFIC GRAVITY, URINE: 1.009 (ref 1.005–1.030)
UROBILINOGEN UA: 0.2 mg/dL (ref 0.0–1.0)
pH: 7.5 (ref 5.0–8.0)

## 2013-11-08 LAB — GLUCOSE, CAPILLARY
GLUCOSE-CAPILLARY: 99 mg/dL (ref 70–99)
GLUCOSE-CAPILLARY: 89 mg/dL (ref 70–99)

## 2013-11-08 LAB — TROPONIN I: Troponin I: <0.3 ng/mL (ref <0.30)

## 2013-11-08 MED ORDER — SENNOSIDES-DOCUSATE SODIUM 8.6-50 MG PO TABS
1.0000 | ORAL_TABLET | Freq: Every evening | ORAL | Status: DC | PRN
  Filled 2013-11-08: qty 1

## 2013-11-08 MED ORDER — SODIUM CHLORIDE 0.9 % IV SOLN
500.0000 mg | Freq: Two times a day (BID) | INTRAVENOUS | Status: DC
  Administered 2013-11-09 (×3): 500 mg via INTRAVENOUS
  Filled 2013-11-08 (×7): qty 5

## 2013-11-08 MED ORDER — SODIUM CHLORIDE 0.9 % IV SOLN: INTRAVENOUS | Status: DC

## 2013-11-08 MED ORDER — LABETALOL HCL 5 MG/ML IV SOLN
10.0000 mg | INTRAVENOUS | Status: DC | PRN
  Administered 2013-11-10 – 2013-11-14 (×11): 10 mg via INTRAVENOUS
  Filled 2013-11-08 (×9): qty 4

## 2013-11-08 MED ORDER — SODIUM CHLORIDE 0.9 % IV SOLN
INTRAVENOUS | Status: DC
  Administered 2013-11-08: 18:00:00 via INTRAVENOUS

## 2013-11-08 MED ORDER — SODIUM CHLORIDE 0.9 % IV SOLN
INTRAVENOUS | Status: AC
  Administered 2013-11-08: via INTRAVENOUS

## 2013-11-08 NOTE — ED Notes (Signed)
MD aware of delay in labs due to no IV access.

## 2013-11-08 NOTE — ED Notes (Signed)
Upon arrival to room, pt trying to get out of bed. Pt alert and following commands but not forming sentences. Pt tries to respond to questions but speech is jumbled.

## 2013-11-08 NOTE — ED Notes (Signed)
Patient transported to CT 

## 2013-11-08 NOTE — ED Notes (Signed)
IV team at bedside 

## 2013-11-08 NOTE — ED Notes (Signed)
Tried to call report to 4N, was told charge nurse on phone with bed control to possibly change placement. Was told will call me back.

## 2013-11-08 NOTE — ED Provider Notes (Signed)
CSN: [id number]     Arrival date & time 11/08/13  1226 History   First MD Initiated Contact with Patient 11/08/13 1237     Chief complaint:  Weakness  (Consider location/radiation/quality/duration/timing/severity/associated sxs/prior Treatment) The history is provided by the patient and a relative. The history is limited by the condition of the patient.  pt w hx htn, renal failure, presents from home w family who states pt generally weak.  Family notes that pt not getting out of bed for past few days, when does try to get up on own knees buckle, generally weak.  During this period, has not been eating or drinking. ?decreased urine output, and when does urinate, 'urinates on self'. Pt has not been talking with/communicating w family during past few days. No specific c/o or c/o of pain from pt. Pt not verbally responsive in ED - level 5 caveat. Family denies loc w falls. No recent fevers.     Past Medical History  Diagnosis Date  . HTN (hypertension)     Resistant  . Hypercholesterolemia   . Tobacco abuse     Resumed smoking half a pack a day. She was a smoker in the past and states she was abl eto stop in the past using nicotine patches  . Coronary atherosclerosis of native coronary artery     STEMI Feb 2009 - 70% distal LAD lesion c/w plaque rupture, DES distal RCA  . CKD (chronic kidney disease) stage 3, GFR 30-59 ml/min     Most recent creatinine 1.4 in 3/10  . Diastolic heart failure     LVEF 60-65% with grade 2 diastolic dysfunction - July 2014  . Subarachnoid hemorrhage     03/2012  . Aneurysm    Past Surgical History  Procedure Laterality Date  . Ventriculoperitoneal shunt  03/27/2012    Procedure: SHUNT INSERTION VENTRICULAR-PERITONEAL;  Surgeon: Winfield Cunas, MD;  Location: [facility];  Service: Neurosurgery;  Laterality: N/A;  Ventricular-Peritoneal Shunt Insertion  . Abdominal hysterectomy    . Bascilic vein transposition Right 05/13/2013    Procedure: [place];  Surgeon: Rosetta Posner, MD;  Location: [facility] OR;  Service: Vascular;  Laterality: Right;   Family History  Problem Relation Age of Onset  . Coronary artery disease Other     Premature in 1st degree relatives  . Heart disease Mother   . Heart disease Father    History  Substance Use Topics  . Smoking status: Current Every Day Smoker -- 0.20 packs/day for 35 years    Types: Cigarettes  . Smokeless tobacco: Never Used     Comment: 1/2 ppd   . Alcohol Use: No   OB History   Grav Para Term Preterm Abortions TAB SAB Ect Mult Living                 Review of Systems  Unable to perform ROS: Patient unresponsive  pt not verbally responsive - level 5 caveat.      Allergies  Ampicillin and Penicillins  Home Medications   Current Outpatient Rx  Name  Route  Sig  Dispense  Refill  . amLODipine (NORVASC) 10 MG tablet   Oral   Take 1 tablet (10 mg total) by mouth daily.   30 tablet   2   . atorvastatin (LIPITOR) 80 MG tablet   Oral   Take 80 mg by mouth daily.         . calcitRIOL (ROCALTROL) 0.25 MCG capsule  Oral   Take 1 capsule (0.25 mcg total) by mouth every Monday, Wednesday, and Friday.   30 capsule   0   . camphor-menthol (SARNA) lotion   Topical   Apply 1 application topically as needed for itching.         . cloNIDine (CATAPRES) 0.3 MG tablet   Oral   Take 1 tablet (0.3 mg total) by mouth 3 (three) times daily.   90 tablet   2   . furosemide (LASIX) 80 MG tablet   Oral   Take 1 tablet (80 mg total) by mouth 2 (two) times daily.   60 tablet   2   . isosorbide mononitrate (IMDUR) 30 MG 24 hr tablet   Oral   Take 2 tablets (60 mg total) by mouth daily.   60 tablet   2   . labetalol (NORMODYNE) 200 MG tablet   Oral   Take 1.5 tablets (300 mg total) by mouth 3 (three) times daily.   135 tablet   2   . levETIRAcetam (KEPPRA) 500 MG tablet   Oral   Take 500 mg by mouth 2 (two) times daily.         Marland Kitchen levofloxacin (LEVAQUIN) 250  MG tablet   Oral   Take 1 tablet (250 mg total) by mouth every other day.   2 tablet   0   . minoxidil (LONITEN) 2.5 MG tablet   Oral   Take 2 tablets (5 mg total) by mouth 2 (two) times daily.   120 tablet   2   . nitroGLYCERIN (NITROSTAT) 0.4 MG SL tablet   Sublingual   Place 1 tablet (0.4 mg total) under the tongue every 5 (five) minutes as needed for chest pain.   20 tablet   0   . omeprazole (PRILOSEC) 20 MG capsule   Oral   Take 20 mg by mouth daily.         . sodium bicarbonate 650 MG tablet   Oral   Take 1 tablet (650 mg total) by mouth 3 (three) times daily.   90 tablet   1   . tamsulosin (FLOMAX) 0.4 MG CAPS capsule   Oral   Take 0.4 mg by mouth daily.          BP 138/67  Pulse 68  Temp(Src) 95.3 F (35.2 C) (Rectal)  Resp 20  SpO2 99% Physical Exam  Nursing note and vitals reviewed. Constitutional: She appears well-developed and well-nourished. No distress.  HENT:  Head: Atraumatic.  Mouth/Throat: Oropharynx is clear and moist.  Eyes: Conjunctivae are normal. Pupils are equal, round, and reactive to light. No scleral icterus.  Neck: Normal range of motion. Neck supple. No tracheal deviation present. No thyromegaly present.  No stiffness or rigidity.  Cardiovascular: Normal rate, regular rhythm, normal heart sounds and intact distal pulses.   Pulmonary/Chest: Effort normal and breath sounds normal. No respiratory distress. She exhibits no tenderness.  Abdominal: Soft. Normal appearance and bowel sounds are normal. She exhibits no distension and no mass. There is no tenderness. There is no rebound and no guarding.  Genitourinary:  No cva tenderness  Musculoskeletal: She exhibits no edema and no tenderness.  Right upper arm AV graft w thrill  Neurological: She is alert. She displays normal reflexes. She exhibits normal muscle tone.  Pt sitting upright in stretcher. Opens eyes to voice/command, at which point appears alert/awake. No attempt at verbal  communication. Follows commands, equal grip.   Skin: Skin is warm  and dry. No rash noted. She is not diaphoretic.  Psychiatric:  Pt unresponsive verbally.     ED Course  Procedures (including critical care time)  Results for orders placed during the hospital encounter of 11/08/13  GLUCOSE, CAPILLARY      Result Value Range   Glucose-Capillary 99  70 - 99 mg/dL  URINE RAPID DRUG SCREEN (HOSP PERFORMED)      Result Value Range   Opiates NONE DETECTED  NONE DETECTED   Cocaine NONE DETECTED  NONE DETECTED   Benzodiazepines NONE DETECTED  NONE DETECTED   Amphetamines NONE DETECTED  NONE DETECTED   Tetrahydrocannabinol NONE DETECTED  NONE DETECTED   Barbiturates NONE DETECTED  NONE DETECTED  URINALYSIS, ROUTINE W REFLEX MICROSCOPIC      Result Value Range   Color, Urine YELLOW  YELLOW   APPearance CLEAR  CLEAR   Specific Gravity, Urine 1.009  1.005 - 1.030   pH 7.5  5.0 - 8.0   Glucose, UA NEGATIVE  NEGATIVE mg/dL   Hgb urine dipstick NEGATIVE  NEGATIVE   Bilirubin Urine NEGATIVE  NEGATIVE   Ketones, ur NEGATIVE  NEGATIVE mg/dL   Protein, ur NEGATIVE  NEGATIVE mg/dL   Urobilinogen, UA 0.2  0.0 - 1.0 mg/dL   Nitrite NEGATIVE  NEGATIVE   Leukocytes, UA NEGATIVE  NEGATIVE   Dg Chest 2 View  11/08/2013   CLINICAL DATA:  Chest pain.  EXAM: CHEST  2 VIEW  COMPARISON:  11/03/2013  FINDINGS: Moderate enlargement of the cardiopericardial silhouette. Normal mediastinal and hilar contours. Clear lungs. No pleural effusion or pneumothorax.  Bony thorax is intact. Stable right anterior chest wall ventriculoperitoneal shunt catheter.  IMPRESSION: No acute cardiopulmonary disease.   Electronically Signed   By: Lajean Manes M.D.   On: 11/08/2013 13:47   Dg Chest 2 View  11/03/2013   CLINICAL DATA:  Smoker, cough, congestion, shortness of breath, chest pain  EXAM: CHEST  2 VIEW  COMPARISON:  10/20/2013  FINDINGS: Moderate cardiac enlargement. Mild vascular congestion less prominent when compared to  the prior study. There is mild bilateral lower lobe atelectasis. There is no evidence of pulmonary edema or consolidation. There are no pleural effusions. Right sided VP shunt tubing is again identified.  IMPRESSION: Stable cardiac enlargement.  No acute findings.   Electronically Signed   By: Skipper Cliche M.D.   On: 11/03/2013 08:08   Dg Chest 2 View  10/20/2013   CLINICAL DATA:  Cough, congestion, fever  EXAM: CHEST  2 VIEW  COMPARISON:  10/13/2013  FINDINGS: Moderate cardiac enlargement. VP shunt tubing projects over the right thorax. There is moderate vascular congestion, which represents a change from the prior study. The perihilar vessels are mildly indistinct.  IMPRESSION: Congestive heart failure with mild pulmonary edema. Vascular congestion is worse when compared to the prior study.   Electronically Signed   By: Skipper Cliche M.D.   On: 10/20/2013 21:02   Ct Head Wo Contrast  11/08/2013   CLINICAL DATA:  Altered mental status  EXAM: CT HEAD WITHOUT CONTRAST  TECHNIQUE: Contiguous axial images were obtained from the base of the skull through the vertex without intravenous contrast.  COMPARISON:  Prior CT from 11/03/2013  FINDINGS: Right frontal approach ventricular catheter is stable in position with tip positioned near the third ventricle. Hypodensity along the ventricular catheter within the right frontal lobe is unchanged. Coil embolization with aneurysm clip again noted. Ventricle size is slightly increased without evidence of hydrocephalus.  No midline  shift or mass effect. No mass lesion. No acute intracranial hemorrhage or infarct No extra-axial fluid collection. Remote left cerebellar infarct noted. Remote lacunar infarct involving the left caudate head is also unchanged.  Calvarium is intact. Sequelae of prior right temporal craniotomy again noted.  Paranasal sinuses and mastoid air cells are clear.  IMPRESSION: 1. No acute intracranial abnormality. 2. Stable position of right frontal  approach ventricular catheter. Overall ventricular size is slightly increased as compared to 11/03/13 without evidence of hydrocephalus.   Electronically Signed   By: Jeannine Boga M.D.   On: 11/08/2013 13:45   Ct Head Wo Contrast  11/03/2013   CLINICAL DATA:  Altered mental status.  EXAM: CT HEAD WITHOUT CONTRAST  TECHNIQUE: Contiguous axial images were obtained from the base of the skull through the vertex without intravenous contrast.  COMPARISON:  CT scan of September 25, 2013.  FINDINGS: Status post right temporal craniotomy. Ventriculoperitoneal shunt is seen entering right frontal skull with tip in left lateral ventricle which is unchanged in position compared to prior exam. 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 Ventricular size is stable compared to prior exam. No mass effect or midline shift is noted. There is no evidence of mass lesion, acute hemorrhage or acute infarction.  IMPRESSION: Right frontal ventriculoperitoneal shunt is unchanged in position. Ventricular size is within normal limits and unchanged compared to prior exam. No acute intracranial abnormality is noted.   Electronically Signed   By: Sabino Dick M.D.   On: 11/03/2013 08:28   Dg Chest Portable 1 View  10/13/2013   CLINICAL DATA:  Short of breath.  EXAM: PORTABLE CHEST - 1 VIEW  COMPARISON:  09/25/2013.  FINDINGS: Cardiomegaly. VP shunt tubing projects over the right chest. Interstitial pulmonary edema is present. Pulmonary vascular congestion. Monitoring leads project over the chest. No focal consolidation. No definite effusion on this single frontal view.  IMPRESSION: Mild CHF.   Electronically Signed   By: Dereck Ligas M.D.   On: 10/13/2013 22:44     EKG Interpretation    Date/Time:  Tuesday November 08 2013 12:49:24 EST Ventricular Rate:  67 PR Interval:  190 QRS Duration: 97 QT Interval:  469 QTC Calculation: 495 R Axis:   -17 Text Interpretation:  Sinus rhythm Left atrial enlargement Left  ventricular hypertrophy Abnormal T, consider ischemia, diffuse leads Confirmed by Lional Icenogle  MD, Simon Aaberg ([id number]) on 11/08/2013 1:00:59 PM            MDM  Iv ns. Labs. Ct.  Reviewed nursing notes and prior charts for additional history.   Recheck, pt appears alert and awake. No distress. Labs pending.   Recheck pt alert, content appearing.  1600 labs pending.  Signed out to Dr Lita Mains to check labs when back, recheck pt and dispo appropriately - given altered mental status and poor po intake, feel likely that patient will require admission once labs back .       Mirna Mires, MD 11/08/13 [phone]

## 2013-11-08 NOTE — ED Notes (Addendum)
Just d/c from [facility] 2 days ago family brought pt to er due to pt having aloc pt was to start dialysis pt no reponding but will follow commands pt has hx of annuryism

## 2013-11-08 NOTE — ED Notes (Signed)
Upon entering pt room, pt was chewing on IV tubing and chewed tubing in half.

## 2013-11-08 NOTE — ED Notes (Signed)
Attempted Iv x 1, patient complained of pain.  Attempt d/c per patient request.  Awaiting IV team

## 2013-11-08 NOTE — ED Notes (Addendum)
MD at bedside, arterial stick to the left groin. Pressure applied by MD. No bleeding noted at site.

## 2013-11-08 NOTE — ED Notes (Addendum)
2 IV attempts made by 2 different RN's. MD aware. IV team paged.

## 2013-11-08 NOTE — ED Notes (Signed)
IV team called, states they are on the way.

## 2013-11-08 NOTE — Progress Notes (Signed)
RT tried twice per protocol to obtain ABG. Rt was unsuccessful due to patient in a lot of pain moving around a lot and balling up fist. RN notified to call MD.

## 2013-11-08 NOTE — H&P (Signed)
[facility] History and Physical  [name] [id number] DOB: 09-24-1961 DOA: 11/08/2013  Referring physician: ER physician. PCP: No PCP Per Patient  Chief Complaint: Altered mental status.  History obtained from previous chart and patient's daughter.  HPI: [name] is a 53 y.o. female history of hypertension, chronic kidney disease, CAD status post stenting and history of subarachnoid hemorrhage status post aneurysmal clipping in 2013 was brought to the ER after patient was found to be increasingly confused and not following commands. Patient was admitted last week to this hospital for hypertensive urgency and at that time patient's medications were adjusted and patient was placed in addition on minoxidil. Patient also was placed on Levaquin for possible bronchitis really dosed. Patient was discharged yesterday and as per patient's daughter patient had done well last evening but towards night patient has become increasingly confused and incontinent of urine. Today morning was completely confused and not following commands and by evening since patient's symptoms were persistent was brought to the ER. On exam patient is nonfocal but was completely disoriented. Moves all extremities. CT head did not show anything acute. Patient otherwise is afebrile and as per patient's daughter patient did not have any nausea vomiting diarrhea abdominal pain chest pain or shortness of breath. Patient was found to be cold by patient's daughter. On arrival in the ER patient was initially mildly hypotensive and hypothermic which had improved. Patient is on Keppra and is not sure if it is just as prophylaxis for for known seizures as patient's daughter states that she does not know if her mom had seizures previously.  Review of Systems: As presented in the history of presenting illness, rest negative.  Past Medical History  Diagnosis Date  . HTN (hypertension)     Resistant  . Hypercholesterolemia   .  Tobacco abuse     Resumed smoking half a pack a day. She was a smoker in the past and states she was abl eto stop in the past using nicotine patches  . Coronary atherosclerosis of native coronary artery     STEMI Feb 2009 - 70% distal LAD lesion c/w plaque rupture, DES distal RCA  . CKD (chronic kidney disease) stage 3, GFR 30-59 ml/min     Most recent creatinine 1.4 in 3/10  . Diastolic heart failure     LVEF 60-65% with grade 2 diastolic dysfunction - July 2014  . Subarachnoid hemorrhage     03/2012  . Aneurysm    Past Surgical History  Procedure Laterality Date  . Ventriculoperitoneal shunt  03/27/2012    Procedure: SHUNT INSERTION VENTRICULAR-PERITONEAL;  Surgeon: Winfield Cunas, MD;  Location: [place] [facility];  Service: Neurosurgery;  Laterality: N/A;  Ventricular-Peritoneal Shunt Insertion  . Abdominal hysterectomy    . Bascilic vein transposition Right 05/13/2013    Procedure: [place];  Surgeon: Rosetta Posner, MD;  Location: [place];  Service: Vascular;  Laterality: Right;   Social History:  reports that she has been smoking Cigarettes.  She has a 7 pack-year smoking history. She has never used smokeless tobacco. She reports that she does not drink alcohol or use illicit drugs. Where does patient live home. Can patient participate in ADLs? Not sure.  Allergies  Allergen Reactions  . Ampicillin Hives and Rash  . Penicillins Hives and Rash    Family History:  Family History  Problem Relation Age of Onset  . Coronary artery disease Other     Premature in 1st degree relatives  . Heart  disease Mother   . Heart disease Father       Prior to Admission medications   Medication Sig Start Date End Date Taking? Authorizing Provider  amLODipine (NORVASC) 10 MG tablet Take 1 tablet (10 mg total) by mouth daily. 11/07/13  Yes Costin Karlyne Greenspan, MD  atorvastatin (LIPITOR) 80 MG tablet Take 80 mg by mouth daily.   Yes Historical Provider, MD  calcitRIOL (ROCALTROL) 0.25 MCG  capsule Take 1 capsule (0.25 mcg total) by mouth every Monday, Wednesday, and Friday. 05/18/13  Yes Janece Canterbury, MD  camphor-[facility]) lotion Apply 1 application topically as needed for itching. 05/18/13  Yes Janece Canterbury, MD  cloNIDine (CATAPRES) 0.3 MG tablet Take 1 tablet (0.3 mg total) by mouth 3 (three) times daily. 11/07/13  Yes Costin Karlyne Greenspan, MD  furosemide (LASIX) 80 MG tablet Take 1 tablet (80 mg total) by mouth 2 (two) times daily. 11/07/13  Yes Costin Karlyne Greenspan, MD  isosorbide mononitrate (IMDUR) 30 MG 24 hr tablet Take 2 tablets (60 mg total) by mouth daily. 11/07/13  Yes Costin Karlyne Greenspan, MD  labetalol (NORMODYNE) 200 MG tablet Take 1.5 tablets (300 mg total) by mouth 3 (three) times daily. 11/07/13  Yes Costin Karlyne Greenspan, MD  levETIRAcetam (KEPPRA) 500 MG tablet Take 500 mg by mouth 2 (two) times daily.   Yes Historical Provider, MD  levofloxacin (LEVAQUIN) 250 MG tablet Take 1 tablet (250 mg total) by mouth every other day. 11/07/13  Yes Costin Karlyne Greenspan, MD  minoxidil (LONITEN) 2.5 MG tablet Take 2 tablets (5 mg total) by mouth 2 (two) times daily. 11/07/13  Yes Costin Karlyne Greenspan, MD  nitroGLYCERIN (NITROSTAT) 0.4 MG SL tablet Place 1 tablet (0.4 mg total) under the tongue every 5 (five) minutes as needed for chest pain. 05/18/13  Yes Janece Canterbury, MD  omeprazole (PRILOSEC) 20 MG capsule Take 20 mg by mouth daily.   Yes Historical Provider, MD  sodium bicarbonate 650 MG tablet Take 1 tablet (650 mg total) by mouth 3 (three) times daily. 10/14/13  Yes Barton Dubois, MD  tamsulosin (FLOMAX) 0.4 MG CAPS capsule Take 0.4 mg by mouth daily.   Yes Historical Provider, MD    Physical Exam: Filed Vitals:   11/08/13 1805 11/08/13 1815 11/08/13 1845 11/08/13 2045  BP: 90/53  102/67 105/68  Pulse: 70     Temp: 97.9 F (36.6 C)     TempSrc: Oral     Resp: 19  13   SpO2: 98% 100%       General:  Well-developed and moderately nourished.  Eyes: Anicteric no pallor. PERRLA  positive.  ENT: No discharge from the ears eyes nose mouth.  Neck: No neck rigidity. No mass felt.  Cardiovascular: S1-S2 heard.  Respiratory: No rhonchi or crepitations.  Abdomen: Soft nontender bowel sounds present.  Skin: No rash.  Musculoskeletal: No edema.  Psychiatric: Patient is confused.  Neurologic: Patient is oriented. Moves all extremities. No facial asymmetry. PERRLA positive.  Labs on Admission:  Basic Metabolic Panel:  Recent Labs Lab 11/04/13 0340 11/05/13 0350 11/06/13 0414 11/07/13 0400 11/08/13 1535  NA 139 141 140 138 141  K 4.5 3.4* 4.0 4.4 5.2  CL 105 105 103 99 101  CO2 19 20 21 24 24   GLUCOSE 101* 120* 104* 118* 93  BUN 35* 32* 30* 28* 33*  CREATININE 3.36* 3.39* 3.50* 3.81* 3.85*  CALCIUM 8.2* 8.4 8.9 8.9 9.3   Liver Function Tests:  Recent Labs Lab 11/03/13 0914 11/08/13  1535  AST 14 34  ALT 21 19  ALKPHOS 88 107  BILITOT 0.5 0.3  PROT 6.8 6.8  ALBUMIN 3.1* 2.9*   No results found for this basename: LIPASE, AMYLASE,  in the last 168 hours No results found for this basename: AMMONIA,  in the last 168 hours CBC:  Recent Labs Lab 11/03/13 0743 11/04/13 0340 11/05/13 0350 11/07/13 0400 11/08/13 1535  WBC 6.1 4.2 4.3 4.6 6.0  NEUTROABS 3.3  --   --   --  4.7  HGB 13.3 10.6* 10.5* 11.8* 13.1  HCT 39.5 31.5* 31.8* 35.8* 37.9  MCV 84.8 84.2 84.6 85.2 83.3  PLT 281 232 239 223 291   Cardiac Enzymes:  Recent Labs Lab 11/03/13 0914 11/08/13 1535  TROPONINI <0.30 <0.30    BNP (last 3 results)  Recent Labs  05/07/13 0500 10/13/13 2210 10/22/13 0534  PROBNP 20527.0* >70000.0* 42680.0*   CBG:  Recent Labs Lab 11/08/13 1229 11/08/13 2040  GLUCAP 99 89    Radiological Exams on Admission: Dg Chest 2 View  11/08/2013   CLINICAL DATA:  Chest pain.  EXAM: CHEST  2 VIEW  COMPARISON:  11/03/2013  FINDINGS: Moderate enlargement of the cardiopericardial silhouette. Normal mediastinal and hilar contours. Clear lungs. No  pleural effusion or pneumothorax.  Bony thorax is intact. Stable right anterior chest wall ventriculoperitoneal shunt catheter.  IMPRESSION: No acute cardiopulmonary disease.   Electronically Signed   By: Lajean Manes M.D.   On: 11/08/2013 13:47   Ct Head Wo Contrast  11/08/2013   CLINICAL DATA:  Altered mental status  EXAM: CT HEAD WITHOUT CONTRAST  TECHNIQUE: Contiguous axial images were obtained from the base of the skull through the vertex without intravenous contrast.  COMPARISON:  Prior CT from 11/03/2013  FINDINGS: Right frontal approach ventricular catheter is stable in position with tip positioned near the third ventricle. Hypodensity along the ventricular catheter within the right frontal lobe is unchanged. Coil embolization with aneurysm clip again noted. Ventricle size is slightly increased without evidence of hydrocephalus.  No midline shift or mass effect. No mass lesion. No acute intracranial hemorrhage or infarct No extra-axial fluid collection. Remote left cerebellar infarct noted. Remote lacunar infarct involving the left caudate head is also unchanged.  Calvarium is intact. Sequelae of prior right temporal craniotomy again noted.  Paranasal sinuses and mastoid air cells are clear.  IMPRESSION: 1. No acute intracranial abnormality. 2. Stable position of right frontal approach ventricular catheter. Overall ventricular size is slightly increased as compared to 11/03/13 without evidence of hydrocephalus.   Electronically Signed   By: Jeannine Boga M.D.   On: 11/08/2013 13:45    EKG: Independently reviewed. Normal sinus rhythm with T-wave inversions in the inferolateral leads.  Assessment/Plan Principal Problem:   Acute encephalopathy Active Problems:   History of subarachnoid hemorrhage   CAD (coronary artery disease)   CKD (chronic kidney disease), stage IV   HTN (hypertension)   1. Acute encephalopathy - cause is not clear. Differentials include postictal state versus  stroke. Patient is afebrile and less likely to be having an infectious cause. At this time I have placed patient on neurochecks. I have requested neurology consult with Dr. Aram Beecham and have discussed the neurologist. 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 Check ammonia levels, TSH, ABG and drug screen. We will continue Keppra IV as patient has failed swallow evaluation. 2. Initial hypotension and hypothermia - presently hypothermia and hypotension has improved. Check TSH cortisol levels  and blood cultures. 3. Recently admitted for hypertensive urgency - presently hypotensive and for now I am holding off all antihypertensives and place patient on when necessary IV labetalol for systolic blood pressure more than 160. Closely observe patient blood pressure trends. 4. CAD status post stenting - since patient had some new EKG changes in the inferolateral leads I have ordered cardiac markers. 5. History of subarachnoid hemorrhage status post aneurysmal clipping. 6. Chronic kidney disease stage 3-4 - as per recent nephrology notes patient has refused to have any dialysis if required. Closely follow intake output metabolic panel.  I have reviewed patient's old charts and labs. Discussed with neurologist.    Code Status: Full code.  Family Communication: Patient's daughter.  Disposition Plan: Admit to inpatient.    Douglas Smolinsky N. [facility] Pager [phone].  If 7PM-7AM, please contact night-coverage [email or web address] Password [email or web address] 11/08/2013, 9:18 PM

## 2013-11-09 ENCOUNTER — Inpatient Hospital Stay (HOSPITAL_COMMUNITY): Payer: Medicare Other

## 2013-11-09 DIAGNOSIS — I319 Disease of pericardium, unspecified: Secondary | ICD-10-CM

## 2013-11-09 DIAGNOSIS — R4182 Altered mental status, unspecified: Secondary | ICD-10-CM

## 2013-11-09 LAB — BLOOD GAS, ARTERIAL
Acid-Base Excess: 0.9 mmol/L (ref 0.0–2.0)
BICARBONATE: 24.8 meq/L — AB (ref 20.0–24.0)
DRAWN BY: 40530
FIO2: 0.21 %
O2 SAT: 95.3 %
PATIENT TEMPERATURE: 98.6
TCO2: 25.9 mmol/L (ref 0–100)
pCO2 arterial: 38.1 mmHg (ref 35.0–45.0)
pH, Arterial: 7.429 (ref 7.350–7.450)
pO2, Arterial: 80.1 mmHg (ref 80.0–100.0)

## 2013-11-09 LAB — TROPONIN I: Troponin I: <0.3 ng/mL (ref <0.30)

## 2013-11-09 LAB — LIPID PANEL
Cholesterol: 122 mg/dL (ref 0–200)
HDL: 47 mg/dL (ref >39)
LDL CALC: 57 mg/dL (ref 0–99)
Total CHOL/HDL Ratio: 2.6 RATIO
Triglycerides: 92 mg/dL (ref <150)
VLDL: 18 mg/dL (ref 0–40)

## 2013-11-09 LAB — CORTISOL: CORTISOL PLASMA: 13.9 ug/dL

## 2013-11-09 LAB — AMMONIA: AMMONIA: 28 umol/L (ref 11–60)

## 2013-11-09 LAB — TSH: TSH: 1.511 u[IU]/mL (ref 0.350–4.500)

## 2013-11-09 LAB — HEMOGLOBIN A1C
HEMOGLOBIN A1C: 5.9 % — AB (ref <5.7)
Mean Plasma Glucose: 123 mg/dL — ABNORMAL HIGH (ref <117)

## 2013-11-09 MED ORDER — ENSURE COMPLETE PO LIQD
237.0000 mL | Freq: Two times a day (BID) | ORAL | Status: DC
  Administered 2013-11-09 – 2013-11-15 (×11): 237 mL via ORAL

## 2013-11-09 NOTE — Progress Notes (Signed)
INITIAL NUTRITION ASSESSMENT  DOCUMENTATION CODES Per approved criteria  -Severe malnutrition in the context of chronic illness   INTERVENTION: Ensure Complete po BID, each supplement provides 350 kcal and 13 grams of protein RD to follow for nutrition care plan  NUTRITION DIAGNOSIS: Increased nutrient needs related to malnutrition, repletion as evidenced by estimated nutrition needs  Goal: Pt to meet >/= 90% of their estimated nutrition needs   Monitor:  PO & supplemental intake, weight, labs, I/O's  Reason for Assessment: Malnutrition Screening Tool Report  53 y.o. female  Admitting Dx: Acute encephalopathy  ASSESSMENT: Patient with history of HTN, CKD, CAD status post stenting and history of subarachnoid hemorrhage status post aneurysmal clipping in 2013 was brought to the ER after patient was found to be increasingly confused and not following commands.  Patient know to Clinical Nutrition for previous admissions; patient somnolent upon RD visit; no family at bedside; patient with recent hospitalization and hx of poor PO intake, non-compliance of low sodium diet; was receiving Ensure Complete supplements with previous hospital stay -- RD to order.  Patient continues to meets criteria for severe malnutrition in the context of chronic illness as evidenced by < 75% intake of estimated energy requirement for > 1 month and 5% weight loss in < 1 month.  Height: Ht Readings from Last 1 Encounters:  11/08/13 5' (1.524 m)    Weight: Wt Readings from Last 1 Encounters:  11/08/13 113 lb 11.2 oz (51.574 kg)    Ideal Body Weight: 100 lb  % Ideal Body Weight: 113%  Wt Readings from Last 10 Encounters:  11/08/13 113 lb 11.2 oz (51.574 kg)  11/07/13 123 lb 7.3 oz (56 kg)  10/23/13 130 lb 8.2 oz (59.2 kg)  10/14/13 140 lb 8 oz (63.73 kg)  09/27/13 137 lb 1.6 oz (62.188 kg)  05/17/13 134 lb 14.7 oz (61.2 kg)  05/14/13 137 lb 12.6 oz (62.5 kg)  05/14/13 137 lb 12.6 oz (62.5 kg)   09/28/12 169 lb (76.658 kg)  05/03/12 141 lb 1.5 oz (64 kg)    Usual Body Weight: 130 lb -- 10/23/13  % Usual Body Weight: 95%  BMI:  Body mass index is 22.21 kg/(m^2).  Estimated Nutritional Needs: Kcal: 1500-1700 Protein: 70-80 gm Fluid: 1.5-1.7 L  Skin: Intact  Diet Order: General  EDUCATION NEEDS: -No education needs identified at this time   Intake/Output Summary (Last 24 hours) at 11/09/13 1500 Last data filed at 11/09/13 1300  Gross per 24 hour  Intake  627.5 ml  Output      0 ml  Net  627.5 ml    Labs:   Recent Labs Lab 11/06/13 0414 11/07/13 0400 11/08/13 1535  NA 140 138 141  K 4.0 4.4 5.2  CL 103 99 101  CO2 21 24 24   BUN 30* 28* 33*  CREATININE 3.50* 3.81* 3.85*  CALCIUM 8.9 8.9 9.3  GLUCOSE 104* 118* 93    CBG (last 3)   Recent Labs  11/08/13 1229 11/08/13 2040  GLUCAP 99 89    Scheduled Meds: . levETIRAcetam  500 mg Intravenous Q12H    Continuous Infusions:   Past Medical History  Diagnosis Date  . HTN (hypertension)     Resistant  . Hypercholesterolemia   . Tobacco abuse     Resumed smoking half a pack a day. She was a smoker in the past and states she was abl eto stop in the past using nicotine patches  . Coronary atherosclerosis of native coronary  artery     STEMI Feb 2009 - 70% distal LAD lesion c/w plaque rupture, DES distal RCA  . CKD (chronic kidney disease) stage 3, GFR 30-59 ml/min     Most recent creatinine 1.4 in 3/10  . Diastolic heart failure     LVEF 60-65% with grade 2 diastolic dysfunction - July 2014  . Subarachnoid hemorrhage     03/2012  . Aneurysm     Past Surgical History  Procedure Laterality Date  . Ventriculoperitoneal shunt  03/27/2012    Procedure: SHUNT INSERTION VENTRICULAR-PERITONEAL;  Surgeon: Winfield Cunas, MD;  Location: [place] [facility];  Service: Neurosurgery;  Laterality: N/A;  Ventricular-Peritoneal Shunt Insertion  . Abdominal hysterectomy    . Bascilic vein transposition Right  05/13/2013    Procedure: [place];  Surgeon: Rosetta Posner, MD;  Location: [place];  Service: Vascular;  Laterality: Right;    Arthur Holms, RD, LDN Pager #: [phone] After-Hours Pager #: [phone]

## 2013-11-09 NOTE — Evaluation (Signed)
Clinical/Bedside Swallow Evaluation Patient Details  Name: [name] MRN: [id number] Date of Birth: [birth year]  Today's Date: 11/09/2013 Time: [id number] SLP Time Calculation (min): 8 min  Past Medical History:  Past Medical History  Diagnosis Date  . HTN (hypertension)     Resistant  . Hypercholesterolemia   . Tobacco abuse     Resumed smoking half a pack a day. She was a smoker in the past and states she was abl eto stop in the past using nicotine patches  . Coronary atherosclerosis of native coronary artery     STEMI Feb 2009 - 70% distal LAD lesion c/w plaque rupture, DES distal RCA  . CKD (chronic kidney disease) stage 3, GFR 30-59 ml/min     Most recent creatinine 1.4 in 3/10  . Diastolic heart failure     LVEF 60-65% with grade 2 diastolic dysfunction - July 2014  . Subarachnoid hemorrhage     03/2012  . Aneurysm    Past Surgical History:  Past Surgical History  Procedure Laterality Date  . Ventriculoperitoneal shunt  03/27/2012    Procedure: SHUNT INSERTION VENTRICULAR-PERITONEAL;  Surgeon: Winfield Cunas, MD;  Location: [place] [facility];  Service: Neurosurgery;  Laterality: N/A;  Ventricular-Peritoneal Shunt Insertion  . Abdominal hysterectomy    . 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 Poor Po intake due to cognition but swallow WNL. Currently admitted for generalized weakness, no eating or drinking, and no talking with family and friends for the past few days. no acute pulmonary finding on CXR. Pt failed RN stroke swallow due to mental status. Unclear to MD if AMS is encephalopathy or new CVA. CT is negative; pt cannot have MRI.    Assessment / Plan /  Recommendation Clinical Impression  Pt demonatrates improving cognition in comparison to admission notes; able to follow commands with cues, verbalizing a bit, taking PO automatically and without evidence of aspiration. Able to masticate solids without pocketing. Recommend regular texture diet and thin liquids. No SLP f/u unless cognitive linguistic function remains very different from baseline per family. For now, will sign off.     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 Poor Po intake due to cognition but swallow WNL. Currently admitted for generalized weakness, no eating or drinking, and no talking with family and friends for the past few days. no acute pulmonary finding on CXR. Pt failed RN stroke swallow due to mental status. Unclear to MD if AMS  is encephalopathy or new CVA. CT is negative; pt cannot have MRI.  Type of Study: Bedside swallow evaluation Previous Swallow Assessment: WFL Diet Prior to this Study: NPO Temperature Spikes Noted: No Respiratory Status: Room air History of Recent Intubation: No Behavior/Cognition: Alert;Cooperative;Requires cueing Oral Cavity - Dentition: Adequate natural dentition Self-Feeding Abilities: Able to feed self;Needs assist Patient Positioning: Upright in bed Baseline Vocal Quality: Clear Volitional  Cough: Cognitively unable to elicit Volitional Swallow: Unable to elicit    Oral/Motor/Sensory Function Overall Oral Motor/Sensory Function: Impaired at baseline Labial ROM: Within Functional Limits Labial Symmetry: Abnormal symmetry right Labial Strength: Within Functional Limits Labial Sensation: Reduced Lingual ROM: Reduced right Lingual Symmetry: Within Functional Limits Lingual Strength: Within Functional Limits Lingual Sensation: Within Functional Limits Facial ROM: Within Functional Limits Facial Symmetry: Within Functional Limits Facial Strength: Within Functional Limits Facial Sensation: Within Functional Limits Velum: Within Functional Limits Mandible: Within Functional Limits   Ice Chips Ice chips: Not tested   Thin Liquid Thin Liquid: Within functional limits Presentation: Cup;Straw;Self Fed    Nectar Thick Nectar Thick Liquid: Not tested   Honey Thick Honey Thick Liquid: Not tested   Puree Puree: Within functional limits   Solid   GO    Solid: Within functional limits Presentation: Diane Harlow Basley, MA CCC-SLP [phone]  [name] 11/09/2013,8:52 AM

## 2013-11-09 NOTE — Progress Notes (Signed)
VASCULAR LAB PRELIMINARY  PRELIMINARY  PRELIMINARY  PRELIMINARY  Carotid duplex completed.    Preliminary report:  Bilateral:  1-39% ICA stenosis.  Vertebral artery flow is antegrade.     Wisdom Rickey, RVS 11/09/2013, 4:21 PM

## 2013-11-09 NOTE — Procedures (Signed)
History: 53 yo F with altered mental status.   Sedation: None  Background: The predominance of this EEG was recorded during sleep or drowsiness. During periods of maximal wakefulness, there continues to be irregular delta and theta activity. There is an increase in the amplitude of faster frequencies at T8 consistent with a breech rhythm. The PDR seen achieved a maximal frequency of 8 Hz, but is poorly sustained.   Photic stimulation: Physiologic driving is not performed  EEG Abnormalities: 1) Breech rhythm on the right  Clinical Interpretation: This EEG is consistent with a mild generalized non-specific cerebral dysfunction(encephalopathy). There was no seizure or seizure predisposition recorded on this study.   Roland Rack, MD [facility] [phone]  If 7pm- 7am, please page neurology on call at [phone].

## 2013-11-09 NOTE — Care Management Note (Signed)
    Page 1 of 1   11/15/2013     3:45:08 PM   CARE MANAGEMENT NOTE 11/15/2013  Patient:  [name], [name]   Account Number:  [id number]  Date Initiated:  11/09/2013  Documentation initiated by:  GRAVES-BIGELOW,Neoma Uhrich  Subjective/Objective Assessment:   Pt admitted for Altered mental status. HX CHF.  Pt lives with daughter. Daughter states that pt does not have PCP and will need one at d/c. CM did speak to daughter in ref to [facility] and she is willing to get set up.     Action/Plan:   CM did call the [facility] to see if pt will be eligible for f/u.  Awaiting call back. PT has been consulted. Per daughter pt uses no DME at home and they have steps to climb. Lives in an apartment. CM will f/u for disposition needs.   Anticipated DC Date:  11/11/2013   Anticipated DC Plan:  [place]  CM consult      Choice offered to / List presented to:             Status of service:  Completed, signed off Medicare Important Message given?   (If response is "NO", the following Medicare IM given date fields will be blank) Date Medicare IM given:   Date Additional Medicare IM given:    Discharge Disposition:  [place]  Per UR Regulation:  Reviewed for med. necessity/level of care/duration of stay  If discussed at [place] of Stay Meetings, dates discussed:   11/15/2013    Comments:  Pt admitted for Altered mental status. HX CHF- continues on iv keppra. CIR is looking at pt vs SNF. Post TEE 11-14-13 Jacqlyn Krauss, RN, BSN [phone]

## 2013-11-09 NOTE — Progress Notes (Signed)
[facility] PROGRESS NOTE  [name] ZRA:[id number] DOB: June 26, 1961 DOA: 11/08/2013 PCP: No PCP Per Patient  Assessment/Plan: 1. Acute encephalopathy - cause is not clear. Differentials include postictal state versus stroke. Patient is afebrile and less likely to be having an infectious cause.  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 ammonia levels 28, TSH 1.5, ABG no hypercapnia. and drug screen negative. We will continue Keppra IV.  2. Initial hypotension and hypothermia - presently hypothermia and hypotension has improved.  blood cultures pending. Hold multiple BP medications.  3. Recently admitted for hypertensive urgency -  holding off all antihypertensives and place patient on when necessary IV labetalol for systolic blood pressure more than 160. Closely observe patient blood pressure trends.  4. CAD status post stenting - since patient had some new EKG changes in the inferolateral leads. Troponin times 2 negative.  5. History of subarachnoid hemorrhage status post aneurysmal clipping. 6. Chronic kidney disease stage 3-4 - as per recent nephrology notes patient has refused to have any dialysis if required. Closely follow intake output metabolic panel.   Code Status: Full Code.  Family Communication: *none at bedside.  Disposition Plan: remain inpatient.    Consultants:  Neurology.   Procedures:  none  Antibiotics:  none  HPI/Subjective: Denies chest pain, confuse. She was not able to tell me her name. Follow some command.   Objective: Filed Vitals:   11/09/13 1655  BP: 131/55  Pulse: 69  Temp: 98 F (36.7 C)  Resp: 16    Intake/Output Summary (Last 24 hours) at 11/09/13 1948 Last data filed at 11/09/13 1700  Gross per 24 hour  Intake  627.5 ml  Output      0 ml  Net  627.5 ml   Filed Weights   11/08/13 2120  Weight: 51.574 kg (113 lb 11.2 oz)    Exam:   General: lethargic but wake up and answer some question.    Cardiovascular: S 1, S 2 RRR  Respiratory: CTA  Abdomen: BS present, soft, NT  Musculoskeletal: no edema  Data Reviewed: Basic Metabolic Panel:  Recent Labs Lab 11/04/13 0340 11/05/13 0350 11/06/13 0414 11/07/13 0400 11/08/13 1535  NA 139 141 140 138 141  K 4.5 3.4* 4.0 4.4 5.2  CL 105 105 103 99 101  CO2 _0 GLUCOSE 101* 120* 104* 118* 93  BUN 35* 32* 30* 28* 33*  CREATININE 3.36* 3.39* 3.50* 3.81* 3.85*  CALCIUM 8.2* 8.4 8.9 8.9 9.3   Liver Function Tests:  Recent Labs Lab 11/03/13 0914 11/08/13 1535  AST 14 34  ALT 21 19  ALKPHOS 88 107  BILITOT 0.5 0.3  PROT 6.8 6.8  ALBUMIN 3.1* 2.9*   No results found for this basename: LIPASE, AMYLASE,  in the last 168 hours  Recent Labs Lab 11/08/13 2245  AMMONIA 28   CBC:  Recent Labs Lab 11/03/13 0743 11/04/13 0340 11/05/13 0350 11/07/13 0400 11/08/13 1535  WBC 6.1 4.2 4.3 4.6 6.0  NEUTROABS 3.3  --   --   --  4.7  HGB 13.3 10.6* 10.5* 11.8* 13.1  HCT 39.5 31.5* 31.8* 35.8* 37.9  MCV 84.8 84.2 84.6 85.2 83.3  PLT 281 232 239 223 291   Cardiac Enzymes:  Recent Labs Lab 11/03/13 0914 11/08/13 1535 11/08/13 2245 11/09/13 0433 11/09/13 1040  TROPONINI <0.30 <0.30 <0.30 <0.30 <0.30   BNP (last 3 results)  Recent Labs  05/07/13 0500 10/13/13 2210 10/22/13 0534  PROBNP 20527.0* >70000.0* 42680.0*   CBG:  Recent Labs Lab 11/08/13 1229 11/08/13 2040  GLUCAP 99 89    Recent Results (from the past 240 hour(s))  RESPIRATORY VIRUS PANEL     Status: Abnormal   Collection Time    11/03/13 10:36 AM      Result Value Range Status   Source - RVPAN NASAL SWAB   Corrected   Comment: CORRECTED ON 01/16 AT 2216: PREVIOUSLY REPORTED AS NASAL SWAB   Respiratory Syncytial Virus A NOT DETECTED   Final   Respiratory Syncytial Virus B NOT DETECTED   Final   Influenza A NOT DETECTED   Final   Influenza B NOT DETECTED   Final   Parainfluenza 1 NOT DETECTED   Final   Parainfluenza 2  DETECTED (*)  Final   Parainfluenza 3 NOT DETECTED   Final   Metapneumovirus NOT DETECTED   Final   Rhinovirus NOT DETECTED   Final   Adenovirus NOT DETECTED   Final   Influenza A H1 NOT DETECTED   Final   Influenza A H3 NOT DETECTED   Final   Comment: (NOTE)           Normal Reference Range for each Analyte: NOT DETECTED     Testing performed using the Luminex xTAG Respiratory Viral Panel test     kit.     This test was developed and its performance characteristics determined     by [facility]. It has not been cleared or approved by the [place]     Food and Drug Administration. This test is used for clinical purposes.     It should not be regarded as investigational or for research. This     laboratory is certified under the [place] (CLIA) as qualified to perform high complexity     clinical laboratory testing.     Performed at [facility]     Status: None   Collection Time    11/03/13 11:27 AM      Result Value Range Status   Specimen Description URINE, CLEAN CATCH   Final   Special Requests NONE   Final   Culture  Setup Time     Final   Value: 11/03/2013 14:30     Performed at [facility] Count     Final   Value: >=100,000 COLONIES/ML     Performed at [facility]   Culture     Final   Value: Multiple bacterial morphotypes present, none predominant. Suggest appropriate recollection if clinically indicated.     Performed at [facility]   Report Status 11/04/2013 FINAL   Final  MRSA PCR SCREENING     Status: None   Collection Time    11/03/13  3:27 PM      Result Value Range Status   MRSA by PCR NEGATIVE  NEGATIVE Final   Comment:            The GeneXpert MRSA Assay (FDA     approved for NASAL specimens     only), is one component of a     comprehensive MRSA colonization     surveillance program. It is not     intended to diagnose MRSA     infection nor to  guide or     monitor treatment for     MRSA infections.     Studies: Dg  Chest 2 View  11/08/2013   CLINICAL DATA:  Chest pain.  EXAM: CHEST  2 VIEW  COMPARISON:  11/03/2013  FINDINGS: Moderate enlargement of the cardiopericardial silhouette. Normal mediastinal and hilar contours. Clear lungs. No pleural effusion or pneumothorax.  Bony thorax is intact. Stable right anterior chest wall ventriculoperitoneal shunt catheter.  IMPRESSION: No acute cardiopulmonary disease.   Electronically Signed   By: Lajean Manes M.D.   On: 11/08/2013 13:47   Ct Head Wo Contrast  11/08/2013   CLINICAL DATA:  Altered mental status  EXAM: CT HEAD WITHOUT CONTRAST  TECHNIQUE: Contiguous axial images were obtained from the base of the skull through the vertex without intravenous contrast.  COMPARISON:  Prior CT from 11/03/2013  FINDINGS: Right frontal approach ventricular catheter is stable in position with tip positioned near the third ventricle. Hypodensity along the ventricular catheter within the right frontal lobe is unchanged. Coil embolization with aneurysm clip again noted. Ventricle size is slightly increased without evidence of hydrocephalus.  No midline shift or mass effect. No mass lesion. No acute intracranial hemorrhage or infarct No extra-axial fluid collection. Remote left cerebellar infarct noted. Remote lacunar infarct involving the left caudate head is also unchanged.  Calvarium is intact. Sequelae of prior right temporal craniotomy again noted.  Paranasal sinuses and mastoid air cells are clear.  IMPRESSION: 1. No acute intracranial abnormality. 2. Stable position of right frontal approach ventricular catheter. Overall ventricular size is slightly increased as compared to 11/03/13 without evidence of hydrocephalus.   Electronically Signed   By: Jeannine Boga M.D.   On: 11/08/2013 13:45    Scheduled Meds: . feeding supplement (ENSURE COMPLETE)  237 mL Oral BID BM  . levETIRAcetam  500 mg Intravenous  Q12H   Continuous Infusions:   Principal Problem:   Acute encephalopathy Active Problems:   History of subarachnoid hemorrhage   CAD (coronary artery disease)   CKD (chronic kidney disease), stage IV   HTN (hypertension)    Time spent: 25 minutes.     Genevie Elman  [facility] Pager [phone]. If 7PM-7AM, please contact night-coverage at [email or web address], password [facility] 11/09/2013, 7:48 PM  LOS: 1 day

## 2013-11-09 NOTE — Progress Notes (Signed)
Dr. Hal Hope paged to make aware ABG and 2nd set of blood cultures are unable to be collected at this time. Pt is a hard stick and will not follow commands. ABG attempted x2 and unsuccessful.  Awaiting call back. Will continue to monitor.

## 2013-11-09 NOTE — Progress Notes (Signed)
UR Completed Heike Pounds Graves-Bigelow, RN,BSN [phone]  

## 2013-11-09 NOTE — Progress Notes (Signed)
EEG Completed; Results Pending  

## 2013-11-09 NOTE — Consult Note (Signed)
NEURO HOSPITALIST CONSULT NOTE    Reason for Consult: altered mental status  HPI:                                                                                                                                          [name] is an 53 y.o. female with multiple medical problems including HTN, hypercholesterolemia, smoking, CAD, MI, chronic diastolic heart failure, [facility] 6/13 s/p clipping and VP shunt, CKD stage 3, brought to [facility] ED by family due to generalized weakness, no eating or drinking, and no talking with family and friends for the past few days. Upon arrival to the ED she was reportedly able to answer some questions but at this moment she just says no to any questions and is not really able to engage in conversation and thus all information is obtained from chart review. Current work up includes CT brain without acute abnormality, normal white count, creatinine of 3.85 but normal electrolytes. TSH and ammonia in progress. On keppra 500 mg BID ( unclear if she ever had a seizure).    Past Medical History  Diagnosis Date  . HTN (hypertension)     Resistant  . Hypercholesterolemia   . Tobacco abuse     Resumed smoking half a pack a day. She was a smoker in the past and states she was abl eto stop in the past using nicotine patches  . Coronary atherosclerosis of native coronary artery     STEMI Feb 2009 - 70% distal LAD lesion c/w plaque rupture, DES distal RCA  . CKD (chronic kidney disease) stage 3, GFR 30-59 ml/min     Most recent creatinine 1.4 in 3/10  . Diastolic heart failure     LVEF 60-65% with grade 2 diastolic dysfunction - July 2014  . Subarachnoid hemorrhage     03/2012  . Aneurysm     Past Surgical History  Procedure Laterality Date  . Ventriculoperitoneal shunt  03/27/2012    Procedure: SHUNT INSERTION VENTRICULAR-PERITONEAL;  Surgeon: Winfield Cunas, MD;  Location: [facility];  Service: Neurosurgery;  Laterality: N/A;  Ventricular-Peritoneal  Shunt Insertion  . Abdominal hysterectomy    . Bascilic vein transposition Right 05/13/2013    Procedure: [place];  Surgeon: Rosetta Posner, MD;  Location: [facility] OR;  Service: Vascular;  Laterality: Right;    Family History  Problem Relation Age of Onset  . Coronary artery disease Other     Premature in 1st degree relatives  . Heart disease Mother   . Heart disease Father     Social History:  reports that she has been smoking Cigarettes.  She has a 7 pack-year smoking history. She has never used smokeless tobacco. She reports that she does not drink alcohol or use illicit  drugs.  Allergies  Allergen Reactions  . Ampicillin Hives and Rash  . Penicillins Hives and Rash    MEDICATIONS:                                                                                                                     I have reviewed the patient's current medications.   ROS: unable to obtain due to mental status.                                                                                                                           History obtained from chart review   Physical exam: patient is non communicative but doesn't seem to be in distress.Blood pressure 116/79, pulse 72, temperature 97.2 F (36.2 C), temperature source Oral, resp. rate 18, height 5' (1.524 m), weight 51.574 kg (113 lb 11.2 oz), SpO2 100.00%.  Head: normocephalic. Neck: supple, no bruits, no JVD. Cardiac: no murmurs. Lungs: clear. Abdomen: soft, no tender, no mass. Extremities: no edema.  Neurologic Examination:                                                                                                      Mental status: alert, inattentive,  say " yes" to any questions, doesn't follow commands. CN 2-12: pupils 2-3 mm bilaterally, reactive to light. No gaze preference. Blinks to threat. EOM full without nystagmus. Face symmetric. Tongue midline. Motor: moves all limbs symmetrically. Sensory: reacts to  pain. DTRs: brisk all over. Plantars: right upgoing, left downgoing. Coordination and gait: unable to test. No meningeal signs.  Lab Results  Component Value Date/Time   CHOL 167 04/18/2011 12:07 PM    Results for orders placed during the hospital encounter of 11/08/13 (from the past 48 hour(s))  GLUCOSE, CAPILLARY     Status: None   Collection Time    11/08/13 12:29 PM      Result Value Range   Glucose-Capillary 99  70 - 99 mg/dL  URINE RAPID DRUG SCREEN (HOSP PERFORMED)     Status: None  Collection Time    11/08/13  1:04 PM      Result Value Range   Opiates NONE DETECTED  NONE DETECTED   Cocaine NONE DETECTED  NONE DETECTED   Benzodiazepines NONE DETECTED  NONE DETECTED   Amphetamines NONE DETECTED  NONE DETECTED   Tetrahydrocannabinol NONE DETECTED  NONE DETECTED   Barbiturates NONE DETECTED  NONE DETECTED   Comment:            DRUG SCREEN FOR MEDICAL PURPOSES     ONLY.  IF CONFIRMATION IS NEEDED     FOR ANY PURPOSE, NOTIFY LAB     WITHIN 5 DAYS.                LOWEST DETECTABLE LIMITS     FOR URINE DRUG SCREEN     Drug Class       Cutoff (ng/mL)     Amphetamine      1000     Barbiturate      200     Benzodiazepine   654     Tricyclics       650     Opiates          300     Cocaine          300     THC              50  URINALYSIS, ROUTINE W REFLEX MICROSCOPIC     Status: None   Collection Time    11/08/13  1:04 PM      Result Value Range   Color, Urine YELLOW  YELLOW   APPearance CLEAR  CLEAR   Specific Gravity, Urine 1.009  1.005 - 1.030   pH 7.5  5.0 - 8.0   Glucose, UA NEGATIVE  NEGATIVE mg/dL   Hgb urine dipstick NEGATIVE  NEGATIVE   Bilirubin Urine NEGATIVE  NEGATIVE   Ketones, ur NEGATIVE  NEGATIVE mg/dL   Protein, ur NEGATIVE  NEGATIVE mg/dL   Urobilinogen, UA 0.2  0.0 - 1.0 mg/dL   Nitrite NEGATIVE  NEGATIVE   Leukocytes, UA NEGATIVE  NEGATIVE   Comment: MICROSCOPIC NOT DONE ON URINES WITH NEGATIVE PROTEIN, BLOOD, LEUKOCYTES, NITRITE, OR GLUCOSE  <1000 mg/dL.  CBC WITH DIFFERENTIAL     Status: Abnormal   Collection Time    11/08/13  3:35 PM      Result Value Range   WBC 6.0  4.0 - 10.5 K/uL   RBC 4.55  3.87 - 5.11 MIL/uL   Hemoglobin 13.1  12.0 - 15.0 g/dL   HCT 37.9  36.0 - 46.0 %   MCV 83.3  78.0 - 100.0 fL   MCH 28.8  26.0 - 34.0 pg   MCHC 34.6  30.0 - 36.0 g/dL   RDW 16.2 (*) 11.5 - 15.5 %   Platelets 291  150 - 400 K/uL   Neutrophils Relative % 79 (*) 43 - 77 %   Lymphocytes Relative 14  12 - 46 %   Monocytes Relative 6  3 - 12 %   Eosinophils Relative 1  0 - 5 %   Basophils Relative 0  0 - 1 %   Neutro Abs 4.7  1.7 - 7.7 K/uL   Lymphs Abs 0.8  0.7 - 4.0 K/uL   Monocytes Absolute 0.4  0.1 - 1.0 K/uL   Eosinophils Absolute 0.1  0.0 - 0.7 K/uL   Basophils Absolute 0.0  0.0 - 0.1 K/uL   RBC Morphology TARGET  CELLS    COMPREHENSIVE METABOLIC PANEL     Status: Abnormal   Collection Time    11/08/13  3:35 PM      Result Value Range   Sodium 141  137 - 147 mEq/L   Potassium 5.2  3.7 - 5.3 mEq/L   Comment: HEMOLYSIS AT THIS LEVEL MAY AFFECT RESULT   Chloride 101  96 - 112 mEq/L   CO2 24  19 - 32 mEq/L   Glucose, Bld 93  70 - 99 mg/dL   BUN 33 (*) 6 - 23 mg/dL   Creatinine, Ser 3.85 (*) 0.50 - 1.10 mg/dL   Calcium 9.3  8.4 - 10.5 mg/dL   Total Protein 6.8  6.0 - 8.3 g/dL   Albumin 2.9 (*) 3.5 - 5.2 g/dL   AST 34  0 - 37 U/L   Comment: HEMOLYSIS AT THIS LEVEL MAY AFFECT RESULT   ALT 19  0 - 35 U/L   Comment: HEMOLYSIS AT THIS LEVEL MAY AFFECT RESULT   Alkaline Phosphatase 107  39 - 117 U/L   Comment: HEMOLYSIS AT THIS LEVEL MAY AFFECT RESULT   Total Bilirubin 0.3  0.3 - 1.2 mg/dL   GFR calc non Af Amer 13 (*) >90 mL/min   GFR calc Af Amer 15 (*) >90 mL/min   Comment: (NOTE)     The eGFR has been calculated using the CKD EPI equation.     This calculation has not been validated in all clinical situations.     eGFR's persistently <90 mL/min signify possible Chronic Kidney     Disease.  TROPONIN I     Status:  None   Collection Time    11/08/13  3:35 PM      Result Value Range   Troponin I <0.30  <0.30 ng/mL   Comment:            Due to the release kinetics of cTnI,     a negative result within the first hours     of the onset of symptoms does not rule out     myocardial infarction with certainty.     If myocardial infarction is still suspected,     repeat the test at appropriate intervals.  GLUCOSE, CAPILLARY     Status: None   Collection Time    11/08/13  8:40 PM      Result Value Range   Glucose-Capillary 89  70 - 99 mg/dL    Dg Chest 2 View  11/08/2013   CLINICAL DATA:  Chest pain.  EXAM: CHEST  2 VIEW  COMPARISON:  11/03/2013  FINDINGS: Moderate enlargement of the cardiopericardial silhouette. Normal mediastinal and hilar contours. Clear lungs. No pleural effusion or pneumothorax.  Bony thorax is intact. Stable right anterior chest wall ventriculoperitoneal shunt catheter.  IMPRESSION: No acute cardiopulmonary disease.   Electronically Signed   By: Lajean Manes M.D.   On: 11/08/2013 13:47   Ct Head Wo Contrast  11/08/2013   CLINICAL DATA:  Altered mental status  EXAM: CT HEAD WITHOUT CONTRAST  TECHNIQUE: Contiguous axial images were obtained from the base of the skull through the vertex without intravenous contrast.  COMPARISON:  Prior CT from 11/03/2013  FINDINGS: Right frontal approach ventricular catheter is stable in position with tip positioned near the third ventricle. Hypodensity along the ventricular catheter within the right frontal lobe is unchanged. Coil embolization with aneurysm clip again noted. Ventricle size is slightly increased without evidence of hydrocephalus.  No midline shift or  mass effect. No mass lesion. No acute intracranial hemorrhage or infarct No extra-axial fluid collection. Remote left cerebellar infarct noted. Remote lacunar infarct involving the left caudate head is also unchanged.  Calvarium is intact. Sequelae of prior right temporal craniotomy again noted.   Paranasal sinuses and mastoid air cells are clear.  IMPRESSION: 1. No acute intracranial abnormality. 2. Stable position of right frontal approach ventricular catheter. Overall ventricular size is slightly increased as compared to 11/03/13 without evidence of hydrocephalus.   Electronically Signed   By: Jeannine Boga M.D.   On: 11/08/2013 13:45   Assessment/Plan: 53 y/o with altered mental status. She is inattentive and does not speak or follows commands, but it is difficult to say if this is the result of aphasia or global cerebral dysfunction in the context of an encephalopathy. 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  Will get EEG. Continue keppra for now. Will follow up.  Dorian Pod, MD 11/09/2013, 12:04 AM

## 2013-11-09 NOTE — Progress Notes (Signed)
Patient seen by Dr. Armida Sans this am.   She follows commands for me and answers some simple questions, though is disoriented. She does not seem to be in any distress. She states "stop pinching me" when noxious stimuli is applied. Her creatinine is elevated, but has baseline renal dysfunction.   She is on keppra 500mg  BID. She has a right temporal lesion from previous surgery.   She seems improved compared to earlier exam of Dr. Armida Sans, unclear cause of her encephalopathy. Will follow workup, likely repeat CT tomorrow given aneurysm clips.    Roland Rack, MD [facility] [phone]  If 7pm- 7am, please page neurology on call at [phone].

## 2013-11-09 NOTE — Evaluation (Signed)
Physical Therapy Evaluation Patient Details Name: [name] MRN: [id number] DOB: 25-Jan-1961 Today's Date: 11/09/2013 Time: 1030-1100 PT Time Calculation (min): 30 min  PT Assessment / Plan / Recommendation History of Present Illness  Pt admitted with malignant HTN, n/v, abdominal pain and SOB.  Pt with hx of medical non compliance.  Clinical Impression  Pt admitted with above. Pt currently with functional limitations due to the deficits listed below (see PT Problem List). Pt will benefit from skilled PT to increase their independence and safety with mobility to allow discharge to the venue listed below.     PT Assessment  Patient needs continued PT services    Follow Up Recommendations  SNF;Supervision/Assistance - 24 hour         Barriers to Discharge Decreased caregiver support (per chart appears to have intermittent supervision.)      Equipment Recommendations  Other (comment) (TBA)         Frequency Min 3X/week    Precautions / Restrictions Precautions Precautions: Fall Restrictions Weight Bearing Restrictions: No   Pertinent Vitals/Pain VSS, no pain      Mobility  Bed Mobility Overal bed mobility: Needs Assistance Bed Mobility: Supine to Sit Supine to sit: Mod assist General bed mobility comments: Pt needs cues for technique.  Not following commands consistently.  Transfers Overall transfer level: Needs assistance Equipment used: None Transfers: Sit to/from [facility] Sit to Stand: Mod assist Stand pivot transfers: Mod assist General transfer comment: Pt needs mod assist to transfer bed to chair with assist for weight shifting.  Pt with flexed posture at trunk and hips as well.  Pt soaked with urine and this PT changed pt and washed urine off of her.  Pt tranferred to chair and was dry upon departure.  Poor coordination of right hemibody.   Modified Rankin (Stroke Patients Only) Pre-Morbid Rankin Score: Slight disability Modified Rankin:  Severe disability         PT Diagnosis: Generalized weakness  PT Problem List: Decreased activity tolerance;Decreased balance;Decreased strength;Decreased knowledge of use of DME;Decreased coordination;Decreased mobility;Decreased safety awareness;Decreased knowledge of precautions PT Treatment Interventions: DME instruction;Gait training;Functional mobility training;Therapeutic activities;Therapeutic exercise;Balance training;Patient/family education     PT Goals(Current goals can be found in the care plan section) Acute Rehab PT Goals Patient Stated Goal: unable to state due to not talking PT Goal Formulation: With patient Time For Goal Achievement: 11/23/13 Potential to Achieve Goals: Good  Visit Information  Last PT Received On: 11/09/13 Assistance Needed: +1 History of Present Illness: Pt admitted with malignant HTN, n/v, abdominal pain and SOB.  Pt with hx of medical non compliance.       Prior [place] expects to be discharged to:: Private residence Living Arrangements: Children Available Help at Discharge: Family;Available 24 hours/day Type of Home: Apartment Home Access: Stairs to enter Entrance Stairs-Number of Steps: 13 Entrance Stairs-Rails: Right Home Layout: One level Home Equipment: None Additional Comments: Information above was from previous encounter.  No family present and pt not conversing much this admit.   Prior Function Comments: unsure of prior functional level.  Communication Communication: Expressive difficulties;Receptive difficulties (pt not communicating much at all) Dominant Hand: Right    Cognition  Cognition Arousal/Alertness: Awake/alert Behavior During Therapy: Flat affect Overall Cognitive Status: Difficult to assess Difficult to assess due to: Impaired communication    Extremity/Trunk Assessment Upper Extremity Assessment Upper Extremity Assessment: Defer to OT evaluation Lower Extremity  Assessment Lower Extremity Assessment: RLE deficits/detail;LLE deficits/detail RLE Deficits /  Details: 3-/5 LLE Deficits / Details: 3-/5   Balance Balance Overall balance assessment: Needs assistance;History of Falls Sitting-balance support: Bilateral upper extremity supported;Feet supported Sitting balance-Leahy Scale: Fair Postural control: Right lateral lean Standing balance support: Bilateral upper extremity supported;During functional activity Standing balance-Leahy Scale: Poor Standing balance comment: Needs bil UE support for balance with decr use of right UE and right lateral lean.   End of Session PT - End of Session Equipment Utilized During Treatment: Gait belt Activity Tolerance: Patient limited by fatigue Patient left: in chair;with call bell/phone within reach Nurse Communication: Mobility status       INGOLD,Daryll Spisak 11/09/2013, 4:47 PM  [facility] [phone] (pager)

## 2013-11-09 NOTE — Progress Notes (Signed)
Echo Lab  2D Echocardiogram completed.  Lino Lakes, RDCS 11/09/2013 4:12 PM

## 2013-11-10 ENCOUNTER — Inpatient Hospital Stay (HOSPITAL_COMMUNITY): Payer: Medicare Other

## 2013-11-10 ENCOUNTER — Encounter (HOSPITAL_COMMUNITY): Payer: Self-pay | Admitting: Radiology

## 2013-11-10 LAB — CBC
HCT: 39.6 % (ref 36.0–46.0)
HEMOGLOBIN: 13.2 g/dL (ref 12.0–15.0)
MCH: 28.1 pg (ref 26.0–34.0)
MCHC: 33.3 g/dL (ref 30.0–36.0)
MCV: 84.4 fL (ref 78.0–100.0)
Platelets: 238 10*3/uL (ref 150–400)
RBC: 4.69 MIL/uL (ref 3.87–5.11)
RDW: 16.6 % — AB (ref 11.5–15.5)
WBC: 7 10*3/uL (ref 4.0–10.5)

## 2013-11-10 LAB — BASIC METABOLIC PANEL
BUN: 36 mg/dL — ABNORMAL HIGH (ref 6–23)
CALCIUM: 9.5 mg/dL (ref 8.4–10.5)
CO2: 21 mEq/L (ref 19–32)
Chloride: 105 mEq/L (ref 96–112)
Creatinine, Ser: 3.74 mg/dL — ABNORMAL HIGH (ref 0.50–1.10)
GFR, EST AFRICAN AMERICAN: 15 mL/min — AB (ref >90)
GFR, EST NON AFRICAN AMERICAN: 13 mL/min — AB (ref >90)
Glucose, Bld: 108 mg/dL — ABNORMAL HIGH (ref 70–99)
Potassium: 4.6 mEq/L (ref 3.7–5.3)
SODIUM: 144 meq/L (ref 137–147)

## 2013-11-10 LAB — LEVETIRACETAM LEVEL: Levetiracetam Lvl: 49.2 ug/mL — ABNORMAL HIGH (ref 5.0–30.0)

## 2013-11-10 MED ORDER — ASPIRIN EC 325 MG PO TBEC
325.0000 mg | DELAYED_RELEASE_TABLET | Freq: Every day | ORAL | Status: DC
Start: 2013-11-10 — End: 2013-11-11
  Administered 2013-11-10: 325 mg via ORAL
  Filled 2013-11-10: qty 1

## 2013-11-10 MED ORDER — LEVETIRACETAM 500 MG PO TABS
500.0000 mg | ORAL_TABLET | Freq: Two times a day (BID) | ORAL | Status: DC
  Administered 2013-11-10 (×2): 500 mg via ORAL
  Filled 2013-11-10 (×4): qty 1

## 2013-11-10 MED ORDER — AMLODIPINE BESYLATE 10 MG PO TABS
10.0000 mg | ORAL_TABLET | Freq: Every day | ORAL | Status: DC
  Administered 2013-11-10 – 2013-11-15 (×5): 10 mg via ORAL
  Filled 2013-11-10 (×6): qty 1

## 2013-11-10 MED ORDER — CLONIDINE HCL 0.3 MG PO TABS
0.3000 mg | ORAL_TABLET | Freq: Three times a day (TID) | ORAL | Status: DC
  Administered 2013-11-10 – 2013-11-15 (×14): 0.3 mg via ORAL
  Filled 2013-11-10 (×18): qty 1

## 2013-11-10 NOTE — Progress Notes (Signed)
Patient evaluated for [facility] services as she is supposed to follow up with the [facility] post hospital discharge if she goes home. However, noted recommendations are for SNF. Patient nods yes and no and did not speak at bedside. Asked permission for this writer to contact one of her daughters. Nodded yes Probation officer calling Ladene Artist (daughter). Called Larquita at [phone] and discussed the recommendations for rehab post hospital discharge. She states she was concerned because they cannot afford to pay out of pocket for rehab after patient's initial SNF days. Informed her that it will be important to apply for Medicaid for her mother. Asked her to make social worker at SNF aware of the need for Medicaid process to begin. Larquita states she is overwhelmed and is not opposed to SNF placement. Will continue to follow. Made inpatient RNCM aware of conversation with daughter. Will leave [facility] Care Management packet at bedside for daughter. Marthenia Rolling, MSN, RN,BSN- [facility] Liaison289 [phone]

## 2013-11-10 NOTE — Progress Notes (Addendum)
Clinical Social Work Department CLINICAL SOCIAL WORK PLACEMENT NOTE 11/10/2013  Patient:  [name], [name]  Account Number:  [id number] Admit date:  11/08/2013  Clinical Social Worker:  Berton Mount, Latanya Presser  Date/time:  11/10/2013 11:00 AM  Clinical Social Work is seeking post-discharge placement for this patient at the following level of care:   [place]   (*CSW will update this form in Epic as items are completed)   11/10/2013  Patient/family provided with [place] Department of Clinical Social Work's list of facilities offering this level of care within the geographic area requested by the patient (or if unable, by the patient's family).  11/10/2013  Patient/family informed of their freedom to choose among providers that offer the needed level of care, that participate in Medicare, Medicaid or managed care program needed by the patient, have an available bed and are willing to accept the patient.  11/10/2013  Patient/family informed of [facility]' ownership interest in [facility], as well as of the fact that they are under no obligation to receive care at this facility.  PASARR submitted to EDS on 11/10/2013 PASARR number received from EDS on 11/10/2013  FL2 transmitted to all facilities in geographic area requested by pt/family on  11/10/2013 FL2 transmitted to all facilities within larger geographic area on   Patient informed that his/her managed care company has contracts with or will negotiate with  certain facilities, including the following:     Patient/family informed of bed offers received:  11/10/2013 Patient chooses bed at  Physician recommends and patient chooses bed at    Patient to be transferred to [facility]  on  11/15/2013 Patient to be transferred to facility by [facility]  The following physician request were entered in Epic:   Additional Comments:       [place], [place]

## 2013-11-10 NOTE — Progress Notes (Signed)
Subjective: Continues to be aphasic  Exam: Filed Vitals:   11/10/13 1721  BP: 194/60  Pulse: 73  Temp:   Resp:    Gen: In bed, NAD MS: Awake, Alert, follows some simple commands with prompting.  [id number], EOMI Motor: She moves both sides well, but does not cooperate with formal testing.  Sensory:responds to noxious stimuli x 4.   CT head - left parietal region of hypoattenuation consistent with a moderate sized infarct, this is new from previous CT.   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 There appear to be multiple areas which would strongly suggest embolic source. She will need embolic workup.   Recommendations: 1) Will need TEE, will make NPO in case this can be arranged tomorrow.  2) PT, OT, ST 3) Start asa 325mg  qday 4) telemetry.   Roland Rack, MD [facility] [phone]  If 7pm- 7am, please page neurology on call at [phone].

## 2013-11-10 NOTE — Progress Notes (Signed)
[facility] PROGRESS NOTE  [name] RUE:[id number] DOB: February 07, 1961 DOA: 11/08/2013 PCP: No PCP Per Patient  Assessment/Plan: 1. Acute encephalopathy - cause is not clear. Differentials include postictal state versus stroke. Patient is afebrile and less likely to be having an infectious cause.  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 ammonia levels 28, TSH 1.5, ABG no hypercapnia. and drug screen negative. We will continue Keppra. Neuro following. Repeat CT head order rule out stroke. Patient still confuse. Wake up at times.  2. Initial hypotension and hypothermia - presently hypothermia and hypotension has improved.  blood cultures no growth to date.  3. Recently admitted for hypertensive urgency - BP increasing. Will resume Norvasc and clonidine.  4. CAD status post stenting - since patient had some new EKG changes in the inferolateral leads. Troponin times 2 negative.  5. History of subarachnoid hemorrhage status post aneurysmal clipping. 6. Chronic kidney disease stage 3-4 - as per recent nephrology notes patient has refused to have any dialysis if required. Closely follow intake output metabolic panel. Cr stable at    Code Status: Full Code.  Family Communication: none at bedside.  Disposition Plan: remain inpatient.    Consultants:  Neurology.   Procedures:  none  Antibiotics:  none  HPI/Subjective: Wake up. Move extremities at times. Say few word.   Objective: Filed Vitals:   11/10/13 1400  BP: 154/66  Pulse: 66  Temp: 98.5 F (36.9 C)  Resp: 18    Intake/Output Summary (Last 24 hours) at 11/10/13 1524 Last data filed at 11/10/13 1300  Gross per 24 hour  Intake   1090 ml  Output      0 ml  Net   1090 ml   Filed Weights   11/08/13 2120  Weight: 51.574 kg (113 lb 11.2 oz)    Exam:   General: awake,   Cardiovascular: S 1, S 2 RRR  Respiratory: CTA  Abdomen: BS present, soft, NT  Musculoskeletal: no  edema  Data Reviewed: Basic Metabolic Panel:  Recent Labs Lab 11/05/13 0350 11/06/13 0414 11/07/13 0400 11/08/13 1535 11/10/13 1305  NA 141 140 138 141 144  K 3.4* 4.0 4.4 5.2 4.6  CL 105 103 99 101 105  CO2 _0 GLUCOSE 120* 104* 118* 93 108*  BUN 32* 30* 28* 33* 36*  CREATININE 3.39* 3.50* 3.81* 3.85* 3.74*  CALCIUM 8.4 8.9 8.9 9.3 9.5   Liver Function Tests:  Recent Labs Lab 11/08/13 1535  AST 34  ALT 19  ALKPHOS 107  BILITOT 0.3  PROT 6.8  ALBUMIN 2.9*   No results found for this basename: LIPASE, AMYLASE,  in the last 168 hours  Recent Labs Lab 11/08/13 2245  AMMONIA 28   CBC:  Recent Labs Lab 11/04/13 0340 11/05/13 0350 11/07/13 0400 11/08/13 1535 11/10/13 1305  WBC 4.2 4.3 4.6 6.0 7.0  NEUTROABS  --   --   --  4.7  --   HGB 10.6* 10.5* 11.8* 13.1 13.2  HCT 31.5* 31.8* 35.8* 37.9 39.6  MCV 84.2 84.6 85.2 83.3 84.4  PLT 232 239 223 291 238   Cardiac Enzymes:  Recent Labs Lab 11/08/13 1535 11/08/13 2245 11/09/13 0433 11/09/13 1040  TROPONINI <0.30 <0.30 <0.30 <0.30   BNP (last 3 results)  Recent Labs  05/07/13 0500 10/13/13 2210 10/22/13 0534  PROBNP 20527.0* >70000.0* 42680.0*   CBG:  Recent Labs Lab 11/08/13 1229 11/08/13 2040  GLUCAP 99 89  Recent Results (from the past 240 hour(s))  RESPIRATORY VIRUS PANEL     Status: Abnormal   Collection Time    11/03/13 10:36 AM      Result Value Range Status   Source - RVPAN NASAL SWAB   Corrected   Comment: CORRECTED ON 01/16 AT 2216: PREVIOUSLY REPORTED AS NASAL SWAB   Respiratory Syncytial Virus A NOT DETECTED   Final   Respiratory Syncytial Virus B NOT DETECTED   Final   Influenza A NOT DETECTED   Final   Influenza B NOT DETECTED   Final   Parainfluenza 1 NOT DETECTED   Final   Parainfluenza 2 DETECTED (*)  Final   Parainfluenza 3 NOT DETECTED   Final   Metapneumovirus NOT DETECTED   Final   Rhinovirus NOT DETECTED   Final   Adenovirus NOT DETECTED   Final    Influenza A H1 NOT DETECTED   Final   Influenza A H3 NOT DETECTED   Final   Comment: (NOTE)           Normal Reference Range for each Analyte: NOT DETECTED     Testing performed using the Luminex xTAG Respiratory Viral Panel test     kit.     This test was developed and its performance characteristics determined     by [facility]. It has not been cleared or approved by the [place]     Food and Drug Administration. This test is used for clinical purposes.     It should not be regarded as investigational or for research. This     laboratory is certified under the [place] (CLIA) as qualified to perform high complexity     clinical laboratory testing.     Performed at [facility]     Status: None   Collection Time    11/03/13 11:27 AM      Result Value Range Status   Specimen Description URINE, CLEAN CATCH   Final   Special Requests NONE   Final   Culture  Setup Time     Final   Value: 11/03/2013 14:30     Performed at [facility] Count     Final   Value: >=100,000 COLONIES/ML     Performed at [facility]   Culture     Final   Value: Multiple bacterial morphotypes present, none predominant. Suggest appropriate recollection if clinically indicated.     Performed at [facility]   Report Status 11/04/2013 FINAL   Final  MRSA PCR SCREENING     Status: None   Collection Time    11/03/13  3:27 PM      Result Value Range Status   MRSA by PCR NEGATIVE  NEGATIVE Final   Comment:            The GeneXpert MRSA Assay (FDA     approved for NASAL specimens     only), is one component of a     comprehensive MRSA colonization     surveillance program. It is not     intended to diagnose MRSA     infection nor to guide or     monitor treatment for     MRSA infections.  CULTURE, BLOOD (ROUTINE X 2)     Status: None   Collection Time    11/08/13 10:45 PM      Result Value  Range  Status   Specimen Description BLOOD LEFT ARM   Final   Special Requests BOTTLES DRAWN AEROBIC ONLY 5CC   Final   Culture  Setup Time     Final   Value: 11/09/2013 03:26     Performed at [facility]   Culture     Final   Value:        BLOOD CULTURE RECEIVED NO GROWTH TO DATE CULTURE WILL BE HELD FOR 5 DAYS BEFORE ISSUING A FINAL NEGATIVE REPORT     Performed at [facility]   Report Status PENDING   Incomplete  CULTURE, BLOOD (ROUTINE X 2)     Status: None   Collection Time    11/08/13 11:38 PM      Result Value Range Status   Specimen Description BLOOD LEFT HAND   Final   Special Requests BOTTLES DRAWN AEROBIC AND ANAEROBIC 10CC EACH   Final   Culture  Setup Time     Final   Value: 11/09/2013 03:26     Performed at [facility]   Culture     Final   Value:        BLOOD CULTURE RECEIVED NO GROWTH TO DATE CULTURE WILL BE HELD FOR 5 DAYS BEFORE ISSUING A FINAL NEGATIVE REPORT     Performed at [facility]   Report Status PENDING   Incomplete     Studies: No results found.  Scheduled Meds: . amLODipine  10 mg Oral Daily  . cloNIDine  0.3 mg Oral TID  . feeding supplement (ENSURE COMPLETE)  237 mL Oral BID BM  . levETIRAcetam  500 mg Oral BID   Continuous Infusions:   Principal Problem:   Acute encephalopathy Active Problems:   History of subarachnoid hemorrhage   CAD (coronary artery disease)   CKD (chronic kidney disease), stage IV   HTN (hypertension)    Time spent: 25 minutes.     [name]  [facility] Pager [phone]. If 7PM-7AM, please contact night-coverage at [email or web address], password [facility] 11/10/2013, 3:24 PM  LOS: 2 days

## 2013-11-10 NOTE — Progress Notes (Signed)
Clinical Social Work Department BRIEF PSYCHOSOCIAL ASSESSMENT 11/10/2013  Patient:  [name], [name]     Account Number:  [id number]     Admit date:  11/08/2013  Clinical Social Worker:  Adair Laundry  Date/Time:  11/10/2013 11:00 AM  Referred by:  Physician  Date Referred:  11/10/2013 Referred for  SNF Placement   Other Referral:   Interview type:  Family Other interview type:   Spoke with pt daughter on the phone    PSYCHOSOCIAL DATA Living Status:  WITH ADULT CHILDREN Admitted from facility:   Level of care:   Primary support name:  Warrick Parisian [phone] Primary support relationship to patient:  CHILD, ADULT Degree of support available:   Pt has supportive family    CURRENT CONCERNS Current Concerns  Post-Acute Placement   Other Concerns:    SOCIAL WORK ASSESSMENT / PLAN CSW spoke with pt daughter over the phone about PT recommendation. Pt daughter said the family was aware of recommendation but are unsure what they would want. Pt family understands safety concerns and reason for recommendation, however they have concerns because a family member was previous in a SNF and did not receive good care. CSW offered support and explained SNF referral process. CSW explained that if pt family is okay with CSW sending out referal right now then that will allow them more time to visit facilities and become comfortable with their choice. Pt daughter was agreeable.   Assessment/plan status:  Psychosocial Support/Ongoing Assessment of Needs Other assessment/ plan:   Information/referral to community resources:   SNF list to be provided with bed offers    PATIENT'S/FAMILY'S RESPONSE TO PLAN OF CARE: Pt family is hesitant towards SNF but are agreeable.       [place], [place]

## 2013-11-10 NOTE — Progress Notes (Signed)
IV infiltrated; IV team called and RN unable to restart peripheral IV (may need PICC line); currently no IV access.  IV team to send another RN to assess pt.  Oncoming night shift nurse notified.

## 2013-11-11 ENCOUNTER — Inpatient Hospital Stay (HOSPITAL_COMMUNITY): Payer: Medicare Other

## 2013-11-11 ENCOUNTER — Encounter (HOSPITAL_COMMUNITY)
Admission: EM | Disposition: A | Discharge status: Skilled Nursing Facility | Payer: Self-pay | Source: Home / Self Care | Attending: Internal Medicine

## 2013-11-11 DIAGNOSIS — Z8673 Personal history of transient ischemic attack (TIA), and cerebral infarction without residual deficits: Secondary | ICD-10-CM

## 2013-11-11 DIAGNOSIS — I10 Essential (primary) hypertension: Secondary | ICD-10-CM

## 2013-11-11 DIAGNOSIS — I6340 Cerebral infarction due to embolism of unspecified cerebral artery: Secondary | ICD-10-CM

## 2013-11-11 LAB — BASIC METABOLIC PANEL
BUN: 35 mg/dL — ABNORMAL HIGH (ref 6–23)
CALCIUM: 9.5 mg/dL (ref 8.4–10.5)
CO2: 22 meq/L (ref 19–32)
Chloride: 108 mEq/L (ref 96–112)
Creatinine, Ser: 3.73 mg/dL — ABNORMAL HIGH (ref 0.50–1.10)
GFR calc Af Amer: 15 mL/min — ABNORMAL LOW (ref >90)
GFR calc non Af Amer: 13 mL/min — ABNORMAL LOW (ref >90)
Glucose, Bld: 89 mg/dL (ref 70–99)
POTASSIUM: 4.8 meq/L (ref 3.7–5.3)
SODIUM: 146 meq/L (ref 137–147)

## 2013-11-11 LAB — CBC
HCT: 39.3 % (ref 36.0–46.0)
Hemoglobin: 12.8 g/dL (ref 12.0–15.0)
MCH: 27.7 pg (ref 26.0–34.0)
MCHC: 32.6 g/dL (ref 30.0–36.0)
MCV: 85.1 fL (ref 78.0–100.0)
Platelets: 242 10*3/uL (ref 150–400)
RBC: 4.62 MIL/uL (ref 3.87–5.11)
RDW: 16.4 % — ABNORMAL HIGH (ref 11.5–15.5)
WBC: 6.4 10*3/uL (ref 4.0–10.5)

## 2013-11-11 LAB — VITAMIN B12: VITAMIN B 12: 711 pg/mL (ref 211–911)

## 2013-11-11 SURGERY — ECHOCARDIOGRAM, TRANSESOPHAGEAL
Anesthesia: Moderate Sedation

## 2013-11-11 MED ORDER — ASPIRIN 300 MG RE SUPP
300.0000 mg | Freq: Every day | RECTAL | Status: DC
  Administered 2013-11-11: 300 mg via RECTAL
  Filled 2013-11-11 (×5): qty 1

## 2013-11-11 MED ORDER — HYDRALAZINE HCL 20 MG/ML IJ SOLN
10.0000 mg | Freq: Three times a day (TID) | INTRAMUSCULAR | Status: DC | PRN
  Administered 2013-11-11 – 2013-11-14 (×4): 10 mg via INTRAVENOUS
  Filled 2013-11-11 (×6): qty 1

## 2013-11-11 MED ORDER — ATORVASTATIN CALCIUM 80 MG PO TABS
80.0000 mg | ORAL_TABLET | Freq: Every day | ORAL | Status: DC
  Administered 2013-11-11 – 2013-11-15 (×5): 80 mg via ORAL
  Filled 2013-11-11 (×5): qty 1

## 2013-11-11 MED ORDER — LABETALOL HCL 5 MG/ML IV SOLN: 5.0000 mg | Freq: Once | INTRAVENOUS | Status: DC

## 2013-11-11 MED ORDER — ASPIRIN EC 325 MG PO TBEC
325.0000 mg | DELAYED_RELEASE_TABLET | Freq: Every day | ORAL | Status: DC
  Administered 2013-11-12 – 2013-11-15 (×4): 325 mg via ORAL
  Filled 2013-11-11 (×4): qty 1

## 2013-11-11 MED ORDER — LABETALOL HCL 5 MG/ML IV SOLN
10.0000 mg | Freq: Once | INTRAVENOUS | Status: AC
  Administered 2013-11-11: 10 mg via INTRAVENOUS

## 2013-11-11 MED ORDER — SODIUM CHLORIDE 0.9 % IV SOLN
500.0000 mg | Freq: Two times a day (BID) | INTRAVENOUS | Status: DC
  Administered 2013-11-11 – 2013-11-15 (×9): 500 mg via INTRAVENOUS
  Filled 2013-11-11 (×10): qty 5

## 2013-11-11 NOTE — Progress Notes (Signed)
MD as well as RRRN called to pt's room around 0400. Pt unresponsive & would not cooperate with her neuro checks. Pt's bp 210/82. Pt was given 10 mg of labetolol IV. Pt would not respond to the babinski reflex. Pt would not hold up her arm or leg. After several deep sternum rubs by RN pt woke up talking saying leave me alone don't do that. Pt would move her feet back when trying to test her for sensation. When asked to lift her legs or arms pt would not lift them. Pt sent down for stat CT. Pt currently up brushing her teeth after being bathed. Will continue to monitor the pt. Hoover Brunette, RN

## 2013-11-11 NOTE — Progress Notes (Signed)
I have attempted to get in contact with patient's daughter re: consent for TEE. I have tried every phone number we have listed and have only gotten voice mail boxes. I have left multiple messages on all the numbers. Will continue to make contact. Will notify endoscopy. Will continue to monitor. - Soyla Dryer, RN

## 2013-11-11 NOTE — Progress Notes (Signed)
Stroke Team Progress Note  HISTORY [name] is an 53 y.o. female with multiple medical problems including HTN, hypercholesterolemia, smoking, CAD, MI, chronic diastolic heart failure, [facility] 6/13 s/p clipping and VP shunt, CKD stage 3, brought to [facility] ED 11/08/2013 by family due to generalized weakness, no eating or drinking, and no talking with family and friends for the past few days.  Upon arrival to the ED she was reportedly able to answer some questions but at time of exam she just says no to any questions and is not really able to engage in conversation and thus all information is obtained from chart review.  Current work up includes CT brain without acute abnormality, normal white count, creatinine of 3.85 but normal electrolytes. TSH and ammonia in progress.  On keppra 500 mg BID ( unclear if she ever had a seizure).  Neurology followed. Felt she had likely embolic infarct causing aphasia. 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 There appear to be multiple areas which would strongly suggest embolic source. She will need embolic workup.   Patient was not administerd TPA secondary to stroke not recognized on arrival - non focal exam in setting of multiple medical issues. She was admitted for further evaluation and treatment.  SUBJECTIVE Her RN is at the bedside.   No family.  OBJECTIVE Most recent Vital Signs: Filed Vitals:   11/11/13 0356 11/11/13 0427 11/11/13 0550 11/11/13 0755  BP: 142/98 207/83 208/80 201/84  Pulse: 71 70 71 68  Temp:    97.6 F (36.4 C)  TempSrc:    Axillary  Resp:    18  Height:      Weight:      SpO2:    99%   CBG (last 3)   Recent Labs  11/08/13 1229 11/08/13 2040  GLUCAP 99 89    IV Fluid Intake:     MEDICATIONS  . amLODipine  10 mg Oral Daily  . aspirin EC  325 mg Oral Daily  . cloNIDine  0.3 mg Oral TID  . feeding supplement (ENSURE COMPLETE)  237 mL Oral BID BM  . levETIRAcetam  500 mg Oral BID    PRN:  labetalol, senna-docusate  Diet:  NPO  Activity:  OOB with assistance DVT Prophylaxis:  SCDs   CLINICALLY SIGNIFICANT STUDIES Basic Metabolic Panel:  Recent Labs Lab 11/10/13 1305 11/11/13 0405  NA 144 146  K 4.6 4.8  CL 105 108  CO2 21 22  GLUCOSE 108* 89  BUN 36* 35*  CREATININE 3.74* 3.73*  CALCIUM 9.5 9.5   Liver Function Tests:  Recent Labs Lab 11/08/13 1535  AST 34  ALT 19  ALKPHOS 107  BILITOT 0.3  PROT 6.8  ALBUMIN 2.9*   CBC:  Recent Labs Lab 11/08/13 1535 11/10/13 1305 11/11/13 0405  WBC 6.0 7.0 6.4  NEUTROABS 4.7  --   --   HGB 13.1 13.2 12.8  HCT 37.9 39.6 39.3  MCV 83.3 84.4 85.1  PLT 291 238 242   Coagulation: No results found for this basename: LABPROT, INR,  in the last 168 hours Cardiac Enzymes:  Recent Labs Lab 11/08/13 2245 11/09/13 0433 11/09/13 1040  TROPONINI <0.30 <0.30 <0.30   Urinalysis:  Recent Labs Lab 11/08/13 1304  COLORURINE YELLOW  LABSPEC 1.009  PHURINE 7.5  GLUCOSEU NEGATIVE  HGBUR NEGATIVE  BILIRUBINUR NEGATIVE  KETONESUR NEGATIVE  PROTEINUR NEGATIVE  UROBILINOGEN 0.2  NITRITE NEGATIVE  LEUKOCYTESUR NEGATIVE  Lipid Panel    Component Value Date/Time   CHOL 122 11/09/2013 0433   TRIG 92 11/09/2013 0433   HDL 47 11/09/2013 0433   CHOLHDL 2.6 11/09/2013 0433   VLDL 18 11/09/2013 0433   LDLCALC 57 11/09/2013 0433   HgbA1C  Lab Results  Component Value Date   HGBA1C 5.9* 11/09/2013    Urine Drug Screen:     Component Value Date/Time   LABOPIA NONE DETECTED 11/08/2013 1304   LABOPIA NEGATIVE 09/25/2012 2042   COCAINSCRNUR NONE DETECTED 11/08/2013 1304   COCAINSCRNUR NEGATIVE 09/25/2012 2042   LABBENZ NONE DETECTED 11/08/2013 1304   LABBENZ NEGATIVE 09/25/2012 2042   AMPHETMU NONE DETECTED 11/08/2013 1304   AMPHETMU NEGATIVE 09/25/2012 2042   THCU NONE DETECTED 11/08/2013 1304   LABBARB NONE DETECTED 11/08/2013 1304    Alcohol Level: No results found for this basename: ETH,  in the last 168  hours   CT of the brain   11/11/2013    Interval evolution of moderate sized acute ischemic left MCA territory infarct involving the left frontoparietal region. No evidence of hemorrhagic conversion. No midline shift.    11/10/2013    Moderate-sized area of hypoattenuation left posterior frontal and parietal cortex and deep white matter. Gray-white differentiation is obscured. Concern raised for acute left MCA territory infarction. No visible hemorrhage or midline shift.    2D Echocardiogram  EF 60-65% with no source of embolus.   Carotid Doppler  No evidence of hemodynamically significant internal carotid artery stenosis. Vertebral artery flow is antegrade.   TEE   EKG  normal sinus rhythm. For complete results please see formal report.   Therapy Recommendations SNF  Physical Exam  General: The patient is sleepy, but will arouse at the time of examination.  Skin: No significant peripheral edema is noted.   Neurologic Exam  Mental status: The patient is aphasic, is unable to state name, place, day.  Cranial nerves: Facial symmetry is present. Speech is aphasic. Patient will perseverate. The patient will follow some verbal commands. The patient does not demonstrate full version of the eyes laterally, not following commands. Blink to threat is decreased from the right, present on the left.  Motor: The patient has good strength in all 4 extremities. No obvious hemiparesis is seen.  Sensory examination: The patient does respond to pain stimuli on all fours.  Coordination: The patient is unable to follow commands well for cerebellar testing.  Gait and station: The gait is not tested.  Reflexes: Deep tendon reflexes are symmetric.    ASSESSMENT [name] is a 53 y.o. female presenting with altered mental status initially. CT imaging suggests L MCA embolic infarcts.  secondary to unknown source.  On no antithrombotics prior to admission. Now on aspirin 325 mg orally every day  for secondary stroke prevention, though she is unable to swallow. Patient with resultant lethargy, global aphasia, no weakness . Work up underway.  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 day # 3  TREATMENT/PLAN  Continue aspirin. Will add suppository option as pt currently NPO for secondary stroke prevention. TEE to look for embolic source. Will call [facility] to schedule - they can do today.  If positive for PFO (patent foramen ovale), check bilateral lower extremity venous dopplers to  rule out DVT as possible source of stroke. (I have made patient NPO after midnight tonight).   Burnetta Sabin, MSN, RN, ANVP-BC, ANP-BC, [facility] Pager: [phone] 11/11/2013 9:34 AM  I have personally obtained a history, examined the patient, evaluated imaging results, and formulated the assessment and plan of care. I agree with the above. Lenor Coffin

## 2013-11-11 NOTE — Progress Notes (Signed)
Occupational Therapy Evaluation Patient Details Name: Quina Dalton MRN: [id number] DOB: [birth year] Today's Date: 11/11/2013 Time: [id number] OT Time Calculation (min): 31 min  OT Assessment / Plan / Recommendation History of present illness Pt admitted with malignant HTN, n/v, abdominal pain and SOB.  Pt with hx of medical non compliance. Moderate sized acute ischemic left MCA territory infarct involving the left frontoparietal region;     Clinical Impression   Unable to establish PLOF due to apparent global aphasia. Pt demonstrates significant deficits listed below. Pt is apparently globally aphasic and apraxic. Pt also demonstrates "alien arm syndrome" with RUE. Do not feel that pt is uncooperative, but that her difficulty with motor planning and understanding the appropriate use of objects decreases her ability to complete/cooperate with tasks. Pt responds better with gestural cues and if an activity is started, using hand over hand to help her initiate the activity due to her difficulty with motor planning. Pt has difficulty understanding and following through with  verbal commands. If family is able to provide 24/7 care after D/C, CIR should be considered as an option. If not, pt will need SNF. Pt will benefit from skilled OT services to facilitate D/C to next venue due to below deficits.    OT Assessment  Patient needs continued OT Services    Follow Up Recommendations  CIR    Barriers to Discharge Other (comment) (unsure of caregiver support)    Equipment Recommendations  Other (comment) (TBA)    Recommendations for Other Services Rehab consult Speech consult   Frequency  Min 3X/week    Precautions / Restrictions Precautions Precautions: Fall Precaution Comments: aphasic and apraxic Restrictions Weight Bearing Restrictions: No   Pertinent Vitals/Pain no apparent distress     ADL  Eating/Feeding: Other (comment) (will assess) Grooming: Maximal assistance Where Assessed -  Grooming: Supported standing Upper Body Bathing: Maximal assistance Where Assessed - Upper Body Bathing: Supported sit to stand Lower Body Bathing: Maximal assistance Where Assessed - Lower Body Bathing: Supported sit to stand Upper Body Dressing: Maximal assistance Where Assessed - Upper Body Dressing: Supported sitting Lower Body Dressing: Maximal assistance Where Assessed - Lower Body Dressing: Supported sit to Lobbyist: Minimal assistance Toilet Transfer Method: Sit to stand;Other (comment) (ambulaing) Toileting - Clothing Manipulation and Hygiene: +1 Total assistance (incontinent of urine) Equipment Used: Gait belt Transfers/Ambulation Related to ADLs: MinA sit -stand and ambulation ADL Comments: limitedprimarily by cognitive and sensory deficits    OT Diagnosis: Generalized weakness;Cognitive deficits;Disturbance of vision;Apraxia  OT Problem List: Decreased strength;Decreased activity tolerance;Impaired balance (sitting and/or standing);Impaired vision/perception;Decreased coordination;Decreased cognition;Decreased safety awareness;Cardiopulmonary status limiting activity;Impaired sensation;Impaired UE functional use OT Treatment Interventions: Self-care/ADL training;Therapeutic exercise;Neuromuscular education;DME and/or AE instruction;Therapeutic activities;Cognitive remediation/compensation;Visual/perceptual remediation/compensation;Patient/family education;Balance training   OT Goals(Current goals can be found in the care plan section) Acute Rehab OT Goals Patient Stated Goal: unable to state due to not talking OT Goal Formulation: Patient unable to participate in goal setting Time For Goal Achievement: 11/25/13 Potential to Achieve Goals: Fair  Visit Information  Last OT Received On: 11/11/13 Assistance Needed: +1 History of Present Illness: Pt admitted with malignant HTN, n/v, abdominal pain and SOB.  Pt with hx of medical non compliance.       Prior  [place] expects to be discharged to:: Private residence Living Arrangements: Children Available Help at Discharge: Family;Available 24 hours/day Type of Home: Apartment Home Access: Stairs to enter Entrance Stairs-Number of Steps: 13 Entrance Stairs-Rails: Right Home Layout:  One level Home Equipment: None Additional Comments: Information above was from previous encounter.  No family present and pt not conversing much this admit.   Prior Function Level of Independence: Independent Comments: unsure of prior functional level.  Communication Communication: Expressive difficulties;Receptive difficulties Dominant Hand: Right         Vision/Perception Vision - History Baseline Vision: Other (comment) (unsure) Patient Visual Report: Other (comment) (will furhter assess. Pt appears to have a L bias) Vision - Assessment Eye Alignment: Within Functional Limits Vision Assessment: Vision impaired - to be further tested in functional context Perception Perception: Impaired Inattention/Neglect: Other (comment) (r inattention) Spatial Orientation: poor orientation in space Praxis Praxis: Impaired Praxis Impairment Details: Ideation;Perseveration;Ideomotor (limb apraxia R UE)   Cognition  Cognition Arousal/Alertness: Awake/alert Behavior During Therapy: Flat affect Overall Cognitive Status: Impaired/Different from baseline Difficult to assess due to: Impaired communication    Extremity/Trunk Assessment Upper Extremity Assessment Upper Extremity Assessment: RUE deficits/detail RUE Deficits / Details: Limb apraxia. Alien arm  RUE Coordination: decreased fine motor;decreased gross motor (alien arm ) Lower Extremity Assessment RLE Deficits / Details: 3-/5 LLE Deficits / Details: 3-/5     Mobility Bed Mobility Overal bed mobility: Needs Assistance Bed Mobility: Supine to Sit Supine to sit: Mod assist General bed mobility comments: Pt needs  cues for technique.  Not following commands consistently.  Transfers Overall transfer level: Needs assistance Equipment used: None Sit to Stand: Min assist Stand pivot transfers: Min assist General transfer comment: min A     Exercise     Balance Balance Overall balance assessment: Needs assistance Sitting-balance support: Bilateral upper extremity supported;Feet supported Sitting balance-Leahy Scale: Good Postural control: Posterior lean Standing balance-Leahy Scale: Fair   End of Session OT - End of Session Equipment Utilized During Treatment: Gait belt Activity Tolerance: Patient tolerated treatment well Patient left: in bed;with call bell/phone within reach;with bed alarm set Nurse Communication: Mobility status;Other (comment) (D/C plans)  GO     Kenzee Bassin,HILLARY 11/11/2013, [place], OTR/L  [phone] 11/11/2013

## 2013-11-11 NOTE — Progress Notes (Signed)
MD on call notified of pt being uncooperative. Pt would not participate in her neuro check. Pt's bp 200/76. Pt given 10 mg of labetolol at 0101. Will continue to monitor the pt & re-check her bp. Hoover Brunette, RN

## 2013-11-11 NOTE — Progress Notes (Signed)
MD on call notified of pt's bp being 208/80 after receiving 10mg  of labetalol Iv at 0428. Ordered to give another 10 mg of labetalol Iv. Will continue to monitor the pt. Hoover Brunette, RN

## 2013-11-11 NOTE — Progress Notes (Signed)
TEE has been cancelled due to lack of consent. Pt's daughters and sister have not returned phone calls. Will continue to try and contact family members. Dr. Tyrell Antonio aware of the situation. Will continue to monitor.   - Soyla Dryer, RN

## 2013-11-11 NOTE — Progress Notes (Addendum)
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 Repeated CT head show: Moderate-sized area of hypoattenuation left posterior frontal and  parietal cortex and deep white matter. Gray-white differentiation is obscured. Concern raised for acute left MCA territory infarction Since patient has aneurysmal clipping patient cannot get MRI brain done. ammonia levels 28, TSH 1.5, ABG no hypercapnia. and drug screen negative. We will continue Keppra. Neuro following.  Continue with aspirin, lipitor.  For TEE today.  She had repeat CT overnight 1-22 due to AMS.  2-Hypertension: continue with Norvasc and clonidine. I have order IV hydralazine prn, IV labetalol.   3-Initial hypotension and hypothermia - presently hypothermia and hypotension has improved.  blood cultures no growth to date.   4-Recently admitted for hypertensive urgency -  5-CAD status post stenting - since patient had some new EKG changes in the inferolateral leads. Troponin times 2 negative. 6-History of subarachnoid hemorrhage status post aneurysmal clipping. 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 Closely follow intake output metabolic panel. Cr stable at 3.7.   Code Status: Full Code.  Family Communication: Daughter 1-22 Disposition Plan: remain inpatient.    Consultants:  Neurology.   Procedures: Carotid doppler: Mild technical difficulty on the right distal ICA due to Tortuosity. Bilateral - 1% to 39% ICA stenosis. Vertebral artery flow is antegrade.  ECHO;  - Left ventricle: The cavity size was normal. Wall thickness was increased in a pattern of mild LVH. Systolic function was normal. The  estimated ejection fraction was in the range of 60% to 65%. Wall motion was normal; there were no regional wall motion abnormalities. Left ventricular diastolic function parameters were normal. - Right atrium: The atrium was mildly dilated. - Pericardium, extracardiac: A small to moderate, free-flowing pericardial effusion was identified circumferential to the heart. The fluid had no internal echoes.There was no evidence of hemodynamic compromise. Impressions:  - No cardiac source of emboli was indentified.  Antibiotics:  none  HPI/Subjective: Lethargic, follow some command, aphasic, say few words.   Objective: Filed Vitals:   11/11/13 0755  BP: 201/84  Pulse: 68  Temp: 97.6 F (36.4 C)  Resp: 18    Intake/Output Summary (Last 24 hours) at 11/11/13 1024 Last data filed at 11/10/13 1300  Gross per 24 hour  Intake    100 ml  Output      0 ml  Net    100 ml   Filed Weights   11/08/13 2120  Weight: 51.574 kg (113 lb 11.2 oz)    Exam:   General: lethargic, wake up at times, moves extremities at times.   Cardiovascular: S 1, S 2 RRR  Respiratory: CTA  Abdomen: BS present, soft, NT  Musculoskeletal: no edema  Neuro; lethargic, wake up to follow some command, say few words " that is it "   Data Reviewed: Basic Metabolic Panel:  Recent Labs Lab 11/06/13 0414 11/07/13 0400 11/08/13 1535 11/10/13 1305 11/11/13 0405  NA 140 138 141 144 146  K 4.0 4.4 5.2 4.6 4.8  CL 103 99 101 105 108  CO2 _0 GLUCOSE 104* 118* 93 108* 89  BUN 30* 28* 33* 36* 35*  CREATININE 3.50* 3.81* 3.85* 3.74* 3.73*  CALCIUM 8.9  8.9 9.3 9.5 9.5   Liver Function Tests:  Recent Labs Lab 11/08/13 1535  AST 34  ALT 19  ALKPHOS 107  BILITOT 0.3  PROT 6.8  ALBUMIN 2.9*   No results found for this basename: LIPASE, AMYLASE,  in the last 168 hours  Recent Labs Lab 11/08/13 2245  AMMONIA 28   CBC:  Recent Labs Lab 11/05/13 0350 11/07/13 0400  11/08/13 1535 11/10/13 1305 11/11/13 0405  WBC 4.3 4.6 6.0 7.0 6.4  NEUTROABS  --   --  4.7  --   --   HGB 10.5* 11.8* 13.1 13.2 12.8  HCT 31.8* 35.8* 37.9 39.6 39.3  MCV 84.6 85.2 83.3 84.4 85.1  PLT 239 223 291 238 242   Cardiac Enzymes:  Recent Labs Lab 11/08/13 1535 11/08/13 2245 11/09/13 0433 11/09/13 1040  TROPONINI <0.30 <0.30 <0.30 <0.30   BNP (last 3 results)  Recent Labs  05/07/13 0500 10/13/13 2210 10/22/13 0534  PROBNP 20527.0* >70000.0* 42680.0*   CBG:  Recent Labs Lab 11/08/13 1229 11/08/13 2040  GLUCAP 99 89    Recent Results (from the past 240 hour(s))  RESPIRATORY VIRUS PANEL     Status: Abnormal   Collection Time    11/03/13 10:36 AM      Result Value Range Status   Source - RVPAN NASAL SWAB   Corrected   Comment: CORRECTED ON 01/16 AT 2216: PREVIOUSLY REPORTED AS NASAL SWAB   Respiratory Syncytial Virus A NOT DETECTED   Final   Respiratory Syncytial Virus B NOT DETECTED   Final   Influenza A NOT DETECTED   Final   Influenza B NOT DETECTED   Final   Parainfluenza 1 NOT DETECTED   Final   Parainfluenza 2 DETECTED (*)  Final   Parainfluenza 3 NOT DETECTED   Final   Metapneumovirus NOT DETECTED   Final   Rhinovirus NOT DETECTED   Final   Adenovirus NOT DETECTED   Final   Influenza A H1 NOT DETECTED   Final   Influenza A H3 NOT DETECTED   Final   Comment: (NOTE)           Normal Reference Range for each Analyte: NOT DETECTED     Testing performed using the Luminex xTAG Respiratory Viral Panel test     kit.     This test was developed and its performance characteristics determined     by [facility]. It has not been cleared or approved by the [place]     Food and Drug Administration. This test is used for clinical purposes.     It should not be regarded as investigational or for research. This     laboratory is certified under the [place] (CLIA) as qualified to perform high complexity      clinical laboratory testing.     Performed at [facility]     Status: None   Collection Time    11/03/13 11:27 AM      Result Value Range Status   Specimen Description URINE, CLEAN CATCH   Final   Special Requests NONE   Final   Culture  Setup Time     Final   Value: 11/03/2013 14:30     Performed at [facility]     Final   Value: >=100,000 COLONIES/ML     Performed at [facility]  Final   Value: Multiple bacterial morphotypes present, none predominant. Suggest appropriate recollection if clinically indicated.     Performed at [facility]   Report Status 11/04/2013 FINAL   Final  MRSA PCR SCREENING     Status: None   Collection Time    11/03/13  3:27 PM      Result Value Range Status   MRSA by PCR NEGATIVE  NEGATIVE Final   Comment:            The GeneXpert MRSA Assay (FDA     approved for NASAL specimens     only), is one component of a     comprehensive MRSA colonization     surveillance program. It is not     intended to diagnose MRSA     infection nor to guide or     monitor treatment for     MRSA infections.  CULTURE, BLOOD (ROUTINE X 2)     Status: None   Collection Time    11/08/13 10:45 PM      Result Value Range Status   Specimen Description BLOOD LEFT ARM   Final   Special Requests BOTTLES DRAWN AEROBIC ONLY 5CC   Final   Culture  Setup Time     Final   Value: 11/09/2013 03:26     Performed at [facility]   Culture     Final   Value:        BLOOD CULTURE RECEIVED NO GROWTH TO DATE CULTURE WILL BE HELD FOR 5 DAYS BEFORE ISSUING A FINAL NEGATIVE REPORT     Performed at [facility]   Report Status PENDING   Incomplete  CULTURE, BLOOD (ROUTINE X 2)     Status: None   Collection Time    11/08/13 11:38 PM      Result Value Range Status   Specimen Description BLOOD LEFT HAND   Final   Special Requests BOTTLES DRAWN AEROBIC AND ANAEROBIC 10CC EACH   Final   Culture   Setup Time     Final   Value: 11/09/2013 03:26     Performed at [facility]   Culture     Final   Value:        BLOOD CULTURE RECEIVED NO GROWTH TO DATE CULTURE WILL BE HELD FOR 5 DAYS BEFORE ISSUING A FINAL NEGATIVE REPORT     Performed at [facility]   Report Status PENDING   Incomplete     Studies: Ct Head Wo Contrast  11/11/2013   CLINICAL DATA:  Mental status changes  EXAM: CT HEAD WITHOUT CONTRAST  TECHNIQUE: Contiguous axial images were obtained from the base of the skull through the vertex without intravenous contrast.  COMPARISON:  Prior CT from 11/10/2013  FINDINGS: Right frontal approach VP shunt catheter is stable in position with stable ventricular size. No hydrocephalus. 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 Sequelae of prior surgical clipping of right ICA and MCA aneurysms also present.  There has been interval evolution of irregular wedge shaped hypodensity involving the cortical gray matter and subcortical white matter left frontoparietal region, consistent with evolving acute ischemic left-sided MCA territory infarct. No evidence of hemorrhagic conversion. Overall distribution of is infarct is similar as compared to prior exam. No new large vessel territory infarct identified. There is no midline shift. No extra-axial fluid collection.  Remote lacunar infarcts involving the genu of the corpus callosum as well  as the left basal ganglia again noted, unchanged. Remote left cerebellar infarct also unchanged. Prominent vascular calcifications again noted.  Sequelae of prior right frontotemporal craniotomy again noted. Calvarium is otherwise intact. Orbits are normal. Paranasal sinuses and mastoid air cells remain clear.  IMPRESSION: Interval evolution of moderate sized acute ischemic left MCA territory infarct involving the left frontoparietal region. No evidence of hemorrhagic conversion. No midline shift.    Electronically Signed   By: Jeannine Boga M.D.   On: 11/11/2013 05:25   Ct Head Wo Contrast  11/10/2013   CLINICAL DATA:  Altered mental status.  Possible aphasia.  EXAM: CT HEAD WITHOUT CONTRAST  TECHNIQUE: Contiguous axial images were obtained from the base of the skull through the vertex without contrast.  COMPARISON:  CT HEAD W/O CM dated 11/08/2013; CT HEAD W/O CM dated 11/03/2013; CT HEAD W/O CM dated 09/25/2013; CT HEAD W/O CM dated 05/06/2013  FINDINGS: Chronic changes with prior right internal carotid artery aneurysm clipping, basilar tip coiling, and a right MCA aneurysm clipping. Prior right craniotomy and right ventriculoperitoneal shunt. Stable ventricular size.  New area of fairly well-demarcated hypoattenuation affecting the cortex and deep white matter of the left posterior frontoparietal lobe without hemorrhage. Concern raised for acute cerebral infarction. Significant interval change compared with 11/08/13. No evidence for proximal vascular thrombosis on CT (dense MCA sign).  Calvarium otherwise intact. Advanced vascular calcification proximally. No sinus or mastoid disease.  IMPRESSION: Moderate-sized area of hypoattenuation left posterior frontal and parietal cortex and deep white matter. Gray-white differentiation is obscured. Concern raised for acute left MCA territory infarction. No visible hemorrhage or midline shift.   Electronically Signed   By: Rolla Flatten M.D.   On: 11/10/2013 16:24    Scheduled Meds: . amLODipine  10 mg Oral Daily  . aspirin  300 mg Rectal Daily   Or  . aspirin EC  325 mg Oral Daily  . cloNIDine  0.3 mg Oral TID  . feeding supplement (ENSURE COMPLETE)  237 mL Oral BID BM  . levETIRAcetam  500 mg Intravenous Q12H   Continuous Infusions:   Principal Problem:   Acute encephalopathy Active Problems:   History of subarachnoid hemorrhage   CAD (coronary artery disease)   CKD (chronic kidney disease), stage IV   HTN (hypertension)    Time spent:  25 minutes.     Maritsa Hunsucker  [facility] Pager [phone]. If 7PM-7AM, please contact night-coverage at [email or web address], password [facility] 11/11/2013, 10:24 AM  LOS: 3 days

## 2013-11-12 MED ORDER — TAMSULOSIN HCL 0.4 MG PO CAPS
0.4000 mg | ORAL_CAPSULE | Freq: Every day | ORAL | Status: DC
  Administered 2013-11-12 – 2013-11-15 (×4): 0.4 mg via ORAL
  Filled 2013-11-12 (×4): qty 1

## 2013-11-12 MED ORDER — ISOSORBIDE MONONITRATE ER 60 MG PO TB24
60.0000 mg | ORAL_TABLET | Freq: Every day | ORAL | Status: DC
  Administered 2013-11-12 – 2013-11-15 (×4): 60 mg via ORAL
  Filled 2013-11-12 (×4): qty 1

## 2013-11-12 MED ORDER — LABETALOL HCL 300 MG PO TABS
300.0000 mg | ORAL_TABLET | Freq: Three times a day (TID) | ORAL | Status: DC
  Administered 2013-11-12 (×2): 300 mg via ORAL
  Filled 2013-11-12 (×5): qty 1

## 2013-11-12 MED ORDER — HYDRALAZINE HCL 20 MG/ML IJ SOLN
10.0000 mg | Freq: Once | INTRAMUSCULAR | Status: AC
  Administered 2013-11-12: 10 mg via INTRAVENOUS
  Filled 2013-11-12: qty 1

## 2013-11-12 NOTE — Progress Notes (Signed)
Difficulty decreasing pt blood pressure over night.  Gave Clonidine tablet then Hydralazine 10mg  IV x1 dose and Labetalol 10mg  x2 doses.  Pt BP maintained at 197/69.  Notified MD on call who ordered another 1 time dose of Hydralazine 10mg  IV which was given and lowered pt BP to 161/57.  Pt BP beginning to climb again and a 3rd dose of Labetalol has been given.  Pt has had no complaints throughout the night. Will continue to monitor and administer PRN medication as ordered.

## 2013-11-12 NOTE — Progress Notes (Signed)
Stroke Team Progress Note  HISTORY [name] is a 53 y.o. female with multiple medical problems including HTN, hypercholesterolemia, smoking, CAD, MI, chronic diastolic heart failure, [facility] 6/13 s/p clipping and VP shunt, CKD stage 3, brought to [facility] ED 11/08/2013 by family due to generalized weakness, not eating or drinking, and not talking with family and friends for the past few days.  Upon arrival to the ED she was reportedly able to answer some questions but at time of exam she just said no to any questions and was not really able to engage in conversation and thus all information was obtained from chart review.  Work up includeded CT brain without acute abnormality, normal white count, creatinine of 3.85 but normal electrolytes. TSH and ammonia in progress.  On keppra 500 mg BID ( unclear if she ever had a seizure).  Neurology followed. Felt she had likely embolic infarct causing aphasia. 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 There appear to be multiple areas which would strongly suggest embolic source. She will need embolic workup.   Patient was not administerd TPA secondary to stroke not recognized on arrival - non focal exam in setting of multiple medical issues. She was admitted for further evaluation and treatment.  SUBJECTIVE No family members present this morning. The patient remains aphasic. She is able to follow some commands.  OBJECTIVE Most recent Vital Signs: Filed Vitals:   11/12/13 0309 11/12/13 0403 11/12/13 0529 11/12/13 0603  BP: 197/69 161/57 172/72 187/71  Pulse: 68 73 69 68  Temp:  98.2 F (36.8 C)    TempSrc:  Axillary    Resp:  16    Height:      Weight:      SpO2:  98%     CBG (last 3)  No results found for this basename: GLUCAP,  in the last 72 hours  IV Fluid Intake:     MEDICATIONS  . amLODipine  10 mg Oral Daily  . aspirin  300 mg Rectal Daily   Or  . aspirin EC  325 mg Oral Daily  . atorvastatin  80 mg  Oral q1800  . cloNIDine  0.3 mg Oral TID  . feeding supplement (ENSURE COMPLETE)  237 mL Oral BID BM  . levETIRAcetam  500 mg Intravenous BID   PRN:  hydrALAZINE, labetalol, senna-docusate  Diet:  Cardiac  Activity:  OOB with assistance DVT Prophylaxis:  SCDs   CLINICALLY SIGNIFICANT STUDIES Basic Metabolic Panel:   Recent Labs Lab 11/10/13 1305 11/11/13 0405  NA 144 146  K 4.6 4.8  CL 105 108  CO2 21 22  GLUCOSE 108* 89  BUN 36* 35*  CREATININE 3.74* 3.73*  CALCIUM 9.5 9.5   Liver Function Tests:   Recent Labs Lab 11/08/13 1535  AST 34  ALT 19  ALKPHOS 107  BILITOT 0.3  PROT 6.8  ALBUMIN 2.9*   CBC:   Recent Labs Lab 11/08/13 1535 11/10/13 1305 11/11/13 0405  WBC 6.0 7.0 6.4  NEUTROABS 4.7  --   --   HGB 13.1 13.2 12.8  HCT 37.9 39.6 39.3  MCV 83.3 84.4 85.1  PLT 291 238 242   Coagulation: No results found for this basename: LABPROT, INR,  in the last 168 hours Cardiac Enzymes:   Recent Labs Lab 11/08/13 2245 11/09/13 0433 11/09/13 1040  TROPONINI <0.30 <0.30 <0.30   Urinalysis:   Recent Labs Lab 11/08/13 [place]  LABSPEC  1.009  PHURINE 7.5  GLUCOSEU NEGATIVE  HGBUR NEGATIVE  BILIRUBINUR NEGATIVE  KETONESUR NEGATIVE  PROTEINUR NEGATIVE  UROBILINOGEN 0.2  NITRITE NEGATIVE  LEUKOCYTESUR NEGATIVE   Lipid Panel    Component Value Date/Time   CHOL 122 11/09/2013 0433   TRIG 92 11/09/2013 0433   HDL 47 11/09/2013 0433   CHOLHDL 2.6 11/09/2013 0433   VLDL 18 11/09/2013 0433   LDLCALC 57 11/09/2013 0433   HgbA1C  Lab Results  Component Value Date   HGBA1C 5.9* 11/09/2013    Urine Drug Screen:     Component Value Date/Time   LABOPIA NONE DETECTED 11/08/2013 1304   LABOPIA NEGATIVE 09/25/2012 2042   COCAINSCRNUR NONE DETECTED 11/08/2013 1304   COCAINSCRNUR NEGATIVE 09/25/2012 2042   LABBENZ NONE DETECTED 11/08/2013 1304   LABBENZ NEGATIVE 09/25/2012 2042   AMPHETMU NONE DETECTED 11/08/2013 1304   AMPHETMU NEGATIVE  09/25/2012 2042   THCU NONE DETECTED 11/08/2013 1304   LABBARB NONE DETECTED 11/08/2013 1304    Alcohol Level: No results found for this basename: ETH,  in the last 168 hours   CT of the brain   11/11/2013     Interval evolution of moderate sized acute ischemic left MCA territory infarct involving the left frontoparietal region. No evidence of hemorrhagic conversion. No midline shift.    11/10/2013     Moderate-sized area of hypoattenuation left posterior frontal and parietal cortex and deep white matter. Gray-white differentiation is obscured. Concern raised for acute left MCA territory infarction. No visible hemorrhage or midline shift.    2D Echocardiogram  EF 60-65% with no source of embolus.   Carotid Doppler  No evidence of hemodynamically significant internal carotid artery stenosis. Vertebral artery flow is antegrade.   TEE - pending  EKG  normal sinus rhythm. For complete results please see formal report.   Therapy Recommendations SNF  Physical Exam  General: The patient is sleepy, but will arouse at the time of examination.  Skin: No significant peripheral edema is noted.   Neurologic Exam  Mental status: The patient is aphasic, is unable to state name, place, day.  Cranial nerves: Facial symmetry is present. Speech is aphasic. Patient will perseverate. The patient will follow some verbal commands. Likely right field cut due to his responsiveness to direct threat. Pupils equal and reactive to light. Extraocular movements were full.  Motor: The patient has good strength in all 4 extremities. Estimated at least 4/5. No drift noted. No obvious hemiparesis is seen.  Sensory examination: The patient does respond to pain stimuli on all fours.  Coordination: The patient is unable to follow commands well for cerebellar testing.  Gait and station: The gait is not tested.  Reflexes: Deep tendon reflexes are symmetric.    ASSESSMENT [name] is a 53 y.o. female  presenting with altered mental status initially. CT -  Interval evolution of moderate sized acute ischemic left MCA territory infarct involving the left frontoparietal region. Infarct felt to be embolic with unknown source.  On no antithrombotics prior to admission. Now on aspirin 325 mg orally every day for secondary stroke prevention, though she is unable to swallow. Patient with resultant lethargy, global aphasia, no weakness . Work up underway.  hypertension Hyperlipidemia, LDL 57, on lipitor 80 PTA, now on no statin as NPO, goal LDL < 100 (< 70 for diabetics) Tobacco abuse CAD CKD, Cr 1.4 Diastolic heart failure Hx SAH Aneurysm - S/P clipping Ventriculoperitoneal shunt placed 03/2012 [place] day #  4  TREATMENT/PLAN  Continue aspirin. Will add suppository option as pt currently NPO for secondary stroke prevention. TEE to look for embolic source. Will call Day to schedule - they can do today.  If positive for PFO (patent foramen ovale), check bilateral lower extremity venous dopplers to rule out DVT as possible source of stroke. Therapist recommends skilled nursing facility placement. Hypertension has been difficult to control.   Mikey Bussing PA-C [facility] Pager [phone] 11/12/2013, 8:17 AM  I have personally obtained a history, examined the patient, evaluated imaging results, and formulated the assessment and plan of care. I agree with the above.

## 2013-11-12 NOTE — Evaluation (Signed)
Clinical/Bedside Swallow Evaluation Patient Details  Name: [name] MRN: [id number] Date of Birth: [birth year]  Today's Date: 11/12/2013 Time: 1645-1700 SLP Time Calculation (min): 15 min  Past Medical History:  Past Medical History  Diagnosis Date  . HTN (hypertension)     Resistant  . Hypercholesterolemia   . Tobacco abuse     Resumed smoking half a pack a day. She was a smoker in the past and states she was abl eto stop in the past using nicotine patches  . Coronary atherosclerosis of native coronary artery     STEMI Feb 2009 - 70% distal LAD lesion c/w plaque rupture, DES distal RCA  . CKD (chronic kidney disease) stage 3, GFR 30-59 ml/min     Most recent creatinine 1.4 in 3/10  . Diastolic heart failure     LVEF 60-65% with grade 2 diastolic dysfunction - July 2014  . Subarachnoid hemorrhage     03/2012  . Aneurysm    Past Surgical History:  Past Surgical History  Procedure Laterality Date  . Ventriculoperitoneal shunt  03/27/2012    Procedure: SHUNT INSERTION VENTRICULAR-PERITONEAL;  Surgeon: Winfield Cunas, MD;  Location: [place] [facility];  Service: Neurosurgery;  Laterality: N/A;  Ventricular-Peritoneal Shunt Insertion  . Abdominal hysterectomy    . 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 Poor Po intake due to cognition but swallow WNL. Currently admitted for generalized weakness, no eating or drinking, and no talking with family and friends for the past few days. no acute pulmonary finding on CXR. Pt failed RN stroke swallow due to mental status. Unclear to MD if AMS is encephalopathy or new CVA. CT is negative; pt cannot have MRI.  Pt. with neurological changed and  repeat CT revealed Interval evolution of moderate sized acute ischemic left MCA and repeat BSE ordered.    Assessment / Plan / Recommendation Clinical Impression  Pt. with decreased oral cavity sensation and motor movement evidenced by right buccal cavity residue following solid texture without awareness. No overt s/s aspiration with thin or puree, however she is at an increased risk with left CVA.  SLP recommends diet texture downgrade to Dys 1 and continue thin liquids with small sips, straws okay and pills whole in applesauce with full supervision and assist. SLP will continue to follow. PT. WOULD BENEFIT FROM LANGUAGE EVALUATION.  PLEASE ORDER IF AGREE.       Aspiration Risk  Moderate    Diet Recommendation Dysphagia 1 (Puree);Thin liquid   Liquid Administration via: Cup;Straw Medication Administration: Whole meds with puree Supervision: Patient able to self feed;Full supervision/cueing for compensatory strategies Compensations: Slow rate;Small sips/bites;Check for pocketing Postural Changes and/or Swallow Maneuvers: Seated upright 90 degrees    Other  Recommendations Oral Care Recommendations: Oral care BID   Follow Up Recommendations   (TBD)    Frequency and Duration min 2x/week  2 weeks   Pertinent Vitals/Pain WDL         Swallow Study         Oral/Motor/Sensory Function Overall Oral Motor/Sensory Function: Impaired at baseline   Ice Chips Ice chips: Not tested   Thin Liquid Thin Liquid: Within functional limits Presentation:  Cup;Straw    Nectar Thick Nectar Thick Liquid: Not tested   Honey Thick Honey Thick Liquid: Not tested   Puree Puree: Within functional limits   Solid   GO    Solid:  (pocketing with cracker consumed several minutes prior) Oral Phase Impairments: Reduced lingual movement/coordination;Impaired anterior to posterior transit;Impaired mastication Oral Phase Functional Implications: Right lateral sulci pocketing;Right anterior spillage        Orbie Pyo Atheena Spano M.Ed [employer] [phone]  11/12/2013

## 2013-11-12 NOTE — Progress Notes (Signed)
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 Repeated CT head show: Moderate-sized area of hypoattenuation left posterior frontal and  parietal cortex and deep white matter. Gray-white differentiation is obscured. Concern raised for acute left MCA territory infarction Since patient has aneurysmal clipping patient cannot get MRI brain done. ammonia levels 28, TSH 1.5, ABG no hypercapnia. and drug screen negative. We will continue Keppra. Neuro following.  Continue with aspirin, lipitor.  Unable to contact family 1-23 to get consent for TEE.  She had repeat CT overnight 1-22 due to AMS, difficult to wake up.  Patient more alert today.   2-Hypertension: continue with Norvasc and clonidine. IV hydralazine prn, IV labetalol prn. Resume Imdur and Labetalol.   3-Initial hypotension and hypothermia - presently hypothermia and hypotension has improved.  blood cultures no growth to date.   4-Recently admitted for hypertensive urgency -  5-CAD status post stenting - since patient had some new EKG changes in the inferolateral leads. Troponin times 2 negative. 6-History of subarachnoid hemorrhage status post aneurysmal clipping. 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 Closely follow intake output metabolic panel. Cr stable at 3.7. Repeat B-met in am.    Code Status: Full Code.  Family Communication: Daughter 1-22 Disposition Plan: remain inpatient.    Consultants:  Neurology.   Procedures: Carotid doppler: Mild technical difficulty on the right distal ICA due to Tortuosity. Bilateral - 1% to 39% ICA stenosis. Vertebral artery flow is antegrade.  ECHO;  - Left  ventricle: The cavity size was normal. Wall thickness was increased in a pattern of mild LVH. Systolic function was normal. The estimated ejection fraction was in the range of 60% to 65%. Wall motion was normal; there were no regional wall motion abnormalities. Left ventricular diastolic function parameters were normal. - Right atrium: The atrium was mildly dilated. - Pericardium, extracardiac: A small to moderate, free-flowing pericardial effusion was identified circumferential to the heart. The fluid had no internal echoes.There was no evidence of hemodynamic compromise. Impressions:  - No cardiac source of emboli was indentified.  Antibiotics:  none  HPI/Subjective: Patient more alert today, following command, repeating words, aphasic.   Objective: Filed Vitals:   11/12/13 1101  BP: 204/77  Pulse:   Temp:   Resp:     Intake/Output Summary (Last 24 hours) at 11/12/13 1208 Last data filed at 11/12/13 0507  Gross per 24 hour  Intake    400 ml  Output      0 ml  Net    400 ml   Filed Weights   11/08/13 2120  Weight: 51.574 kg (113 lb 11.2 oz)    Exam:   General: Alert, following command.   Cardiovascular: S 1, S 2 RRR  Respiratory: CTA  Abdomen: BS present, soft, NT  Musculoskeletal: no edema  Neuro; alert, following command, aphasic, repeating words.   Data Reviewed: Basic Metabolic Panel:  Recent Labs Lab 11/06/13 0414 11/07/13 0400 11/08/13 1535 11/10/13 1305 11/11/13 0405  NA 140 138 141 144 146  K 4.0 4.4 5.2 4.6 4.8  CL 103 99 101 105 108  CO2 '21 24 24 21 22  ' GLUCOSE 104* 118* 93 108* 89  BUN 30* 28* 33* 36* 35*  CREATININE 3.50*  3.81* 3.85* 3.74* 3.73*  CALCIUM 8.9 8.9 9.3 9.5 9.5   Liver Function Tests:  Recent Labs Lab 11/08/13 1535  AST 34  ALT 19  ALKPHOS 107  BILITOT 0.3  PROT 6.8  ALBUMIN 2.9*   No results found for this basename: LIPASE, AMYLASE,  in the last 168 hours  Recent Labs Lab 11/08/13 2245  AMMONIA 28    CBC:  Recent Labs Lab 11/07/13 0400 11/08/13 1535 11/10/13 1305 11/11/13 0405  WBC 4.6 6.0 7.0 6.4  NEUTROABS  --  4.7  --   --   HGB 11.8* 13.1 13.2 12.8  HCT 35.8* 37.9 39.6 39.3  MCV 85.2 83.3 84.4 85.1  PLT 223 291 238 242   Cardiac Enzymes:  Recent Labs Lab 11/08/13 1535 11/08/13 2245 11/09/13 0433 11/09/13 1040  TROPONINI <0.30 <0.30 <0.30 <0.30   BNP (last 3 results)  Recent Labs  05/07/13 0500 10/13/13 2210 10/22/13 0534  PROBNP 20527.0* >70000.0* 42680.0*   CBG:  Recent Labs Lab 11/08/13 1229 11/08/13 2040  GLUCAP 99 89    Recent Results (from the past 240 hour(s))  RESPIRATORY VIRUS PANEL     Status: Abnormal   Collection Time    11/03/13 10:36 AM      Result Value Range Status   Source - [facility]VPAN NASAL SWAB   Corrected   Comment: CORRECTED ON 01/16 AT 2216: PREVIOUSLY REPORTED AS NASAL SWAB   Respiratory Syncytial Virus A NOT DETECTED   Final   Respiratory Syncytial Virus B NOT DETECTED   Final   Influenza A NOT DETECTED   Final   Influenza B NOT DETECTED   Final   Parainfluenza 1 NOT DETECTED   Final   Parainfluenza 2 DETECTED (*)  Final   Parainfluenza 3 NOT DETECTED   Final   Metapneumovirus NOT DETECTED   Final   Rhinovirus NOT DETECTED   Final   Adenovirus NOT DETECTED   Final   Influenza A H1 NOT DETECTED   Final   Influenza A H3 NOT DETECTED   Final   Comment: (NOTE)           Normal Reference Range for each Analyte: NOT DETECTED     Testing performed using the Luminex xTAG Respiratory Viral Panel test     kit.     This test was developed and its performance characteristics determined     by [facility]. It has not been cleared or approved by the [place]     Food and Drug Administration. This test is used for clinical purposes.     It should not be regarded as investigational or for research. This     laboratory is certified under the [employer] (CLIA) as qualified to perform high  complexity     clinical laboratory testing.     Performed at [facility]     Status: None   Collection Time    11/03/13 11:27 AM      Result Value Range Status   Specimen Description URINE, CLEAN CATCH   Final   Special Requests NONE   Final   Culture  Setup Time     Final   Value: 11/03/2013 14:30     Performed at [facility] Count     Final   Value: >=100,000 COLONIES/ML     Performed at [place]     Final  Value: Multiple bacterial morphotypes present, none predominant. Suggest appropriate recollection if clinically indicated.     Performed at [facility]   Report Status 11/04/2013 FINAL   Final  MRSA PCR SCREENING     Status: None   Collection Time    11/03/13  3:27 PM      Result Value Range Status   MRSA by PCR NEGATIVE  NEGATIVE Final   Comment:            The GeneXpert MRSA Assay (FDA     approved for NASAL specimens     only), is one component of a     comprehensive MRSA colonization     surveillance program. It is not     intended to diagnose MRSA     infection nor to guide or     monitor treatment for     MRSA infections.  CULTURE, BLOOD (ROUTINE X 2)     Status: None   Collection Time    11/08/13 10:45 PM      Result Value Range Status   Specimen Description BLOOD LEFT ARM   Final   Special Requests BOTTLES DRAWN AEROBIC ONLY 5CC   Final   Culture  Setup Time     Final   Value: 11/09/2013 03:26     Performed at [facility]   Culture     Final   Value:        BLOOD CULTURE RECEIVED NO GROWTH TO DATE CULTURE WILL BE HELD FOR 5 DAYS BEFORE ISSUING A FINAL NEGATIVE REPORT     Performed at [facility]   Report Status PENDING   Incomplete  CULTURE, BLOOD (ROUTINE X 2)     Status: None   Collection Time    11/08/13 11:38 PM      Result Value Range Status   Specimen Description BLOOD LEFT HAND   Final   Special Requests BOTTLES DRAWN AEROBIC AND ANAEROBIC 10CC EACH    Final   Culture  Setup Time     Final   Value: 11/09/2013 03:26     Performed at [facility]   Culture     Final   Value:        BLOOD CULTURE RECEIVED NO GROWTH TO DATE CULTURE WILL BE HELD FOR 5 DAYS BEFORE ISSUING A FINAL NEGATIVE REPORT     Performed at [facility]   Report Status PENDING   Incomplete     Studies: Ct Head Wo Contrast  11/11/2013   CLINICAL DATA:  Mental status changes  EXAM: CT HEAD WITHOUT CONTRAST  TECHNIQUE: Contiguous axial images were obtained from the base of the skull through the vertex without intravenous contrast.  COMPARISON:  Prior CT from 11/10/2013  FINDINGS: Right frontal approach VP shunt catheter is stable in position with stable ventricular size. No hydrocephalus. 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 Sequelae of prior surgical clipping of right ICA and MCA aneurysms also present.  There has been interval evolution of irregular wedge shaped hypodensity involving the cortical gray matter and subcortical white matter left frontoparietal region, consistent with evolving acute ischemic left-sided MCA territory infarct. No evidence of hemorrhagic conversion. Overall distribution of is infarct is similar as compared to prior exam. No new large vessel territory infarct identified. There is no midline shift. No extra-axial fluid collection.  Remote lacunar infarcts involving the genu of the corpus callosum as well as the left  basal ganglia again noted, unchanged. Remote left cerebellar infarct also unchanged. Prominent vascular calcifications again noted.  Sequelae of prior right frontotemporal craniotomy again noted. Calvarium is otherwise intact. Orbits are normal. Paranasal sinuses and mastoid air cells remain clear.  IMPRESSION: Interval evolution of moderate sized acute ischemic left MCA territory infarct involving the left frontoparietal region. No evidence of hemorrhagic conversion. No  midline shift.   Electronically Signed   By: Jeannine Boga M.D.   On: 11/11/2013 05:25   Ct Head Wo Contrast  11/10/2013   CLINICAL DATA:  Altered mental status.  Possible aphasia.  EXAM: CT HEAD WITHOUT CONTRAST  TECHNIQUE: Contiguous axial images were obtained from the base of the skull through the vertex without contrast.  COMPARISON:  CT HEAD W/O CM dated 11/08/2013; CT HEAD W/O CM dated 11/03/2013; CT HEAD W/O CM dated 09/25/2013; CT HEAD W/O CM dated 05/06/2013  FINDINGS: Chronic changes with prior right internal carotid artery aneurysm clipping, basilar tip coiling, and a right MCA aneurysm clipping. Prior right craniotomy and right ventriculoperitoneal shunt. Stable ventricular size.  New area of fairly well-demarcated hypoattenuation affecting the cortex and deep white matter of the left posterior frontoparietal lobe without hemorrhage. Concern raised for acute cerebral infarction. Significant interval change compared with 11/08/13. No evidence for proximal vascular thrombosis on CT (dense MCA sign).  Calvarium otherwise intact. Advanced vascular calcification proximally. No sinus or mastoid disease.  IMPRESSION: Moderate-sized area of hypoattenuation left posterior frontal and parietal cortex and deep white matter. Gray-white differentiation is obscured. Concern raised for acute left MCA territory infarction. No visible hemorrhage or midline shift.   Electronically Signed   By: Rolla Flatten M.D.   On: 11/10/2013 16:24    Scheduled Meds: . amLODipine  10 mg Oral Daily  . aspirin  300 mg Rectal Daily   Or  . aspirin EC  325 mg Oral Daily  . atorvastatin  80 mg Oral q1800  . cloNIDine  0.3 mg Oral TID  . feeding supplement (ENSURE COMPLETE)  237 mL Oral BID BM  . isosorbide mononitrate  60 mg Oral Daily  . labetalol  300 mg Oral TID  . levETIRAcetam  500 mg Intravenous BID  . tamsulosin  0.4 mg Oral Daily   Continuous Infusions:   Principal Problem:   Acute encephalopathy Active  Problems:   History of subarachnoid hemorrhage   CAD (coronary artery disease)   CKD (chronic kidney disease), stage IV   HTN (hypertension)   CVA (cerebral infarction)    Time spent: 25 minutes.     Shirely Toren  [facility] Pager [phone]. If 7PM-7AM, please contact night-coverage at [email or web address], password [facility] 11/12/2013, 12:08 PM  LOS: 4 days

## 2013-11-13 LAB — BASIC METABOLIC PANEL
BUN: 45 mg/dL — ABNORMAL HIGH (ref 6–23)
CHLORIDE: 105 meq/L (ref 96–112)
CO2: 18 mEq/L — ABNORMAL LOW (ref 19–32)
CREATININE: 3.6 mg/dL — AB (ref 0.50–1.10)
Calcium: 9.1 mg/dL (ref 8.4–10.5)
GFR calc non Af Amer: 14 mL/min — ABNORMAL LOW (ref >90)
GFR, EST AFRICAN AMERICAN: 16 mL/min — AB (ref >90)
Glucose, Bld: 129 mg/dL — ABNORMAL HIGH (ref 70–99)
POTASSIUM: 4.1 meq/L (ref 3.7–5.3)
SODIUM: 140 meq/L (ref 137–147)

## 2013-11-13 MED ORDER — LABETALOL HCL 300 MG PO TABS
300.0000 mg | ORAL_TABLET | Freq: Three times a day (TID) | ORAL | Status: DC
  Administered 2013-11-13 – 2013-11-15 (×5): 300 mg via ORAL
  Filled 2013-11-13 (×8): qty 1

## 2013-11-13 NOTE — Progress Notes (Signed)
Stroke Team Progress Note  HISTORY [name] is a 53 y.o. female with multiple medical problems including HTN, hypercholesterolemia, smoking, CAD, MI, chronic diastolic heart failure, [facility] 6/13 s/p clipping and VP shunt, CKD stage 3, brought to [facility] ED 11/08/2013 by family due to generalized weakness, not eating or drinking, and not talking with family and friends for the past few days.  Upon arrival to the ED she was reportedly able to answer some questions but at time of exam she just said no to any questions and was not really able to engage in conversation and thus all information was obtained from chart review.  Work up includeded CT brain without acute abnormality, normal white count, creatinine of 3.85 but normal electrolytes. TSH and ammonia in progress.  On keppra 500 mg BID ( unclear if she ever had a seizure).  Neurology followed. Felt she had likely embolic infarct causing aphasia. 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 There appear to be multiple areas which would strongly suggest embolic source. She will need embolic workup.   Patient was not administerd TPA secondary to stroke not recognized on arrival - non focal exam in setting of multiple medical issues. She was admitted for further evaluation and treatment.  SUBJECTIVE No family members present this morning. The patient remains aphasic. She is without complaints.  OBJECTIVE Most recent Vital Signs: Filed Vitals:   11/13/13 0458 11/13/13 0800 11/13/13 0949 11/13/13 0957  BP: 147/70 148/72 202/87   Pulse: 65 80  63  Temp: 97.4 F (36.3 C) 97.5 F (36.4 C)    TempSrc: Oral Oral    Resp: 18 18    Height:      Weight:      SpO2: 100% 96%     CBG (last 3)  No results found for this basename: GLUCAP,  in the last 72 hours  IV Fluid Intake:     MEDICATIONS  . amLODipine  10 mg Oral Daily  . aspirin  300 mg Rectal Daily   Or  . aspirin EC  325 mg Oral Daily  . atorvastatin   80 mg Oral q1800  . cloNIDine  0.3 mg Oral TID  . feeding supplement (ENSURE COMPLETE)  237 mL Oral BID BM  . isosorbide mononitrate  60 mg Oral Daily  . labetalol  300 mg Oral TID  . levETIRAcetam  500 mg Intravenous BID  . tamsulosin  0.4 mg Oral Daily   PRN:  hydrALAZINE, labetalol, senna-docusate  Diet:  Cardiac  Activity:  OOB with assistance DVT Prophylaxis:  SCDs   CLINICALLY SIGNIFICANT STUDIES Basic Metabolic Panel:   Recent Labs Lab 11/11/13 0405 11/13/13 0430  NA 146 140  K 4.8 4.1  CL 108 105  CO2 22 18*  GLUCOSE 89 129*  BUN 35* 45*  CREATININE 3.73* 3.60*  CALCIUM 9.5 9.1   Liver Function Tests:   Recent Labs Lab 11/08/13 1535  AST 34  ALT 19  ALKPHOS 107  BILITOT 0.3  PROT 6.8  ALBUMIN 2.9*   CBC:   Recent Labs Lab 11/08/13 1535 11/10/13 1305 11/11/13 0405  WBC 6.0 7.0 6.4  NEUTROABS 4.7  --   --   HGB 13.1 13.2 12.8  HCT 37.9 39.6 39.3  MCV 83.3 84.4 85.1  PLT 291 238 242   Coagulation: No results found for this basename: LABPROT, INR,  in the last 168 hours Cardiac Enzymes:   Recent Labs Lab 11/08/13  2245 11/09/13 0433 11/09/13 1040  TROPONINI <0.30 <0.30 <0.30   Urinalysis:   Recent Labs Lab 11/08/13 1304  COLORURINE YELLOW  LABSPEC 1.009  PHURINE 7.5  GLUCOSEU NEGATIVE  HGBUR NEGATIVE  BILIRUBINUR NEGATIVE  KETONESUR NEGATIVE  PROTEINUR NEGATIVE  UROBILINOGEN 0.2  NITRITE NEGATIVE  LEUKOCYTESUR NEGATIVE   Lipid Panel    Component Value Date/Time   CHOL 122 11/09/2013 0433   TRIG 92 11/09/2013 0433   HDL 47 11/09/2013 0433   CHOLHDL 2.6 11/09/2013 0433   VLDL 18 11/09/2013 0433   LDLCALC 57 11/09/2013 0433   HgbA1C  Lab Results  Component Value Date   HGBA1C 5.9* 11/09/2013    Urine Drug Screen:     Component Value Date/Time   LABOPIA NONE DETECTED 11/08/2013 1304   LABOPIA NEGATIVE 09/25/2012 2042   COCAINSCRNUR NONE DETECTED 11/08/2013 1304   COCAINSCRNUR NEGATIVE 09/25/2012 2042   LABBENZ NONE DETECTED  11/08/2013 1304   LABBENZ NEGATIVE 09/25/2012 2042   AMPHETMU NONE DETECTED 11/08/2013 1304   AMPHETMU NEGATIVE 09/25/2012 2042   THCU NONE DETECTED 11/08/2013 1304   LABBARB NONE DETECTED 11/08/2013 1304    Alcohol Level: No results found for this basename: ETH,  in the last 168 hours   CT of the brain   11/11/2013     Interval evolution of moderate sized acute ischemic left MCA territory infarct involving the left frontoparietal region. No evidence of hemorrhagic conversion. No midline shift.    11/10/2013     Moderate-sized area of hypoattenuation left posterior frontal and parietal cortex and deep white matter. Gray-white differentiation is obscured. Concern raised for acute left MCA territory infarction. No visible hemorrhage or midline shift.    2D Echocardiogram  EF 60-65% with no source of embolus.   Carotid Doppler  No evidence of hemodynamically significant internal carotid artery stenosis. Vertebral artery flow is antegrade.   TEE - pending  EKG  normal sinus rhythm. For complete results please see formal report.   Therapy Recommendations SNF  Physical Exam  General: The patient is sleepy, but will arouse at the time of examination.  Skin: No significant peripheral edema is noted.   Neurologic Exam  Mental status: The patient is aphasic, is unable to state name, place, day.  Cranial nerves: Facial symmetry is present. Speech is aphasic. Patient will perseverate. The patient will follow some verbal commands. Says " doing OK" Likely right field cut due to his responsiveness to direct threat. Pupils equal and reactive to light. Extraocular movements were full.  Motor: The patient has good strength in all 4 extremities. Estimated at least 4/5. No drift noted. No obvious hemiparesis is seen.  Sensory examination: The patient does respond to pain stimuli on all fours.  Coordination: The patient is unable to follow commands well for cerebellar testing.  Gait and station: The  gait is not tested.  Reflexes: Deep tendon reflexes are symmetric.    ASSESSMENT Ms. [name] is a 53 y.o. female presenting with altered mental status initially. CT -  Interval evolution of moderate sized acute ischemic left MCA territory infarct involving the left frontoparietal region. Infarct felt to be embolic with unknown source.  On no antithrombotics prior to admission. Now on aspirin 325 mg orally every day for secondary stroke prevention, though she is unable to swallow. Patient with resultant lethargy, global aphasia, no weakness . Work up underway.  hypertension Hyperlipidemia, LDL 57, on lipitor 80 PTA, now on no statin as NPO, goal LDL < 100 (<  52 for diabetics) Tobacco abuse CAD CKD, Cr 1.4 Diastolic heart failure Hx SAH Aneurysm - S/P clipping Ventriculoperitoneal shunt placed 03/2012 [place] On keppra Renal [facility] day # 5  TREATMENT/PLAN  Continue aspirin. Will add suppository option as pt currently NPO for secondary stroke prevention. TEE to look for embolic source. Will call [place] to schedule - they can do today.  If positive for PFO (patent foramen ovale), check bilateral lower extremity venous dopplers to rule out DVT as possible source of stroke. Therapist recommends skilled nursing facility placement. Hypertension has been difficult to control.   Mikey Bussing PA-C [facility] Pager [phone] 11/13/2013, 11:06 AM  I have personally obtained a history, examined the patient, evaluated imaging results, and formulated the assessment and plan of care. I agree with the above.

## 2013-11-13 NOTE — Progress Notes (Signed)
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 Repeated CT head show: Moderate-sized area of hypoattenuation left posterior frontal and  parietal cortex and deep white matter. Gray-white differentiation is obscured. Concern raised for acute left MCA territory infarction Since patient has aneurysmal clipping patient cannot get MRI brain done. ammonia levels 28, TSH 1.5, ABG no hypercapnia. and drug screen negative. We will continue Keppra. Neuro following.  Continue with aspirin, lipitor.  Unable to contact family 1-23 to get consent for TEE.  She had repeat CT overnight 1-22 due to AMS, difficult to wake up.  Patient more alert today.  TEE, waiting to get in contact with family for consent.   2-Hypertension: continue with Norvasc and clonidine. IV hydralazine prn, IV labetalol prn. Resume Imdur and Labetalol.   3-Initial hypotension and hypothermia - presently hypothermia and hypotension has improved.  blood cultures no growth to date.   4-Recently admitted for hypertensive urgency -  5-CAD status post stenting - since patient had some new EKG changes in the inferolateral leads. Troponin times 2 negative. 6-History of subarachnoid hemorrhage status post aneurysmal clipping. 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 Closely follow intake output metabolic panel. Cr stable at 3..6.    Code Status: Full Code.  Family Communication: Daughter 1-22 Disposition Plan: remain inpatient.    Consultants:  Neurology.   Procedures: Carotid doppler: Mild technical difficulty on the right distal ICA due to Tortuosity. Bilateral - 1% to 39% ICA stenosis. Vertebral artery  flow is antegrade.  ECHO;  - Left ventricle: The cavity size was normal. Wall thickness was increased in a pattern of mild LVH. Systolic function was normal. The estimated ejection fraction was in the range of 60% to 65%. Wall motion was normal; there were no regional wall motion abnormalities. Left ventricular diastolic function parameters were normal. - Right atrium: The atrium was mildly dilated. - Pericardium, extracardiac: A small to moderate, free-flowing pericardial effusion was identified circumferential to the heart. The fluid had no internal echoes.There was no evidence of hemodynamic compromise. Impressions:  - No cardiac source of emboli was indentified.  Antibiotics:  none  HPI/Subjective: Alert, trying to stand up from bed without assistance. Repeating words. Aphasic.  Objective: Filed Vitals:   11/13/13 1100  BP: 119/89  Pulse: 60  Temp: 97.7 F (36.5 C)  Resp: 16   No intake or output data in the 24 hours ending 11/13/13 1444 Filed Weights   11/08/13 2120  Weight: 51.574 kg (113 lb 11.2 oz)    Exam:   General: Alert, following command.   Cardiovascular: S 1, S 2 RRR  Respiratory: CTA  Abdomen: BS present, soft, NT  Musculoskeletal: no edema  Neuro; alert, following command, aphasic, repeating words.   Data Reviewed: Basic Metabolic Panel:  Recent Labs Lab 11/07/13 0400 11/08/13 1535 11/10/13 1305 11/11/13 0405 11/13/13 0430  NA 138 141 144 146 140  K 4.4 5.2 4.6 4.8 4.1  CL 99 101 105 108 105  CO2 24 24 21 22  18*  GLUCOSE 118* 93 108* 89 129*  BUN 28* 33* 36* 35* 45*  CREATININE 3.81* 3.85* 3.74* 3.73* 3.60*  CALCIUM 8.9 9.3 9.5 9.5 9.1   Liver Function Tests:  Recent Labs Lab 11/08/13  1535  AST 34  ALT 19  ALKPHOS 107  BILITOT 0.3  PROT 6.8  ALBUMIN 2.9*   No results found for this basename: LIPASE, AMYLASE,  in the last 168 hours  Recent Labs Lab 11/08/13 2245  AMMONIA 28   CBC:  Recent Labs Lab  11/07/13 0400 11/08/13 1535 11/10/13 1305 11/11/13 0405  WBC 4.6 6.0 7.0 6.4  NEUTROABS  --  4.7  --   --   HGB 11.8* 13.1 13.2 12.8  HCT 35.8* 37.9 39.6 39.3  MCV 85.2 83.3 84.4 85.1  PLT 223 291 238 242   Cardiac Enzymes:  Recent Labs Lab 11/08/13 1535 11/08/13 2245 11/09/13 0433 11/09/13 1040  TROPONINI <0.30 <0.30 <0.30 <0.30   BNP (last 3 results)  Recent Labs  05/07/13 0500 10/13/13 2210 10/22/13 0534  PROBNP 20527.0* >70000.0* 42680.0*   CBG:  Recent Labs Lab 11/08/13 1229 11/08/13 2040  GLUCAP 99 89    Recent Results (from the past 240 hour(s))  MRSA PCR SCREENING     Status: None   Collection Time    11/03/13  3:27 PM      Result Value Range Status   MRSA by PCR NEGATIVE  NEGATIVE Final   Comment:            The GeneXpert MRSA Assay (FDA     approved for NASAL specimens     only), is one component of a     comprehensive MRSA colonization     surveillance program. It is not     intended to diagnose MRSA     infection nor to guide or     monitor treatment for     MRSA infections.  CULTURE, BLOOD (ROUTINE X 2)     Status: None   Collection Time    11/08/13 10:45 PM      Result Value Range Status   Specimen Description BLOOD LEFT ARM   Final   Special Requests BOTTLES DRAWN AEROBIC ONLY 5CC   Final   Culture  Setup Time     Final   Value: 11/09/2013 03:26     Performed at [facility]   Culture     Final   Value:        BLOOD CULTURE RECEIVED NO GROWTH TO DATE CULTURE WILL BE HELD FOR 5 DAYS BEFORE ISSUING A FINAL NEGATIVE REPORT     Performed at [facility]   Report Status PENDING   Incomplete  CULTURE, BLOOD (ROUTINE X 2)     Status: None   Collection Time    11/08/13 11:38 PM      Result Value Range Status   Specimen Description BLOOD LEFT HAND   Final   Special Requests BOTTLES DRAWN AEROBIC AND ANAEROBIC 10CC EACH   Final   Culture  Setup Time     Final   Value: 11/09/2013 03:26     Performed at [facility]   Culture     Final   Value:        BLOOD CULTURE RECEIVED NO GROWTH TO DATE CULTURE WILL BE HELD FOR 5 DAYS BEFORE ISSUING A FINAL NEGATIVE REPORT     Performed at [facility]   Report Status PENDING   Incomplete     Studies: No results found.  Scheduled Meds: . amLODipine  10 mg Oral Daily  . aspirin  300 mg Rectal Daily   Or  . aspirin EC  325 mg Oral Daily  .  atorvastatin  80 mg Oral q1800  . cloNIDine  0.3 mg Oral TID  . feeding supplement (ENSURE COMPLETE)  237 mL Oral BID BM  . isosorbide mononitrate  60 mg Oral Daily  . labetalol  300 mg Oral TID  . levETIRAcetam  500 mg Intravenous BID  . tamsulosin  0.4 mg Oral Daily   Continuous Infusions:   Principal Problem:   CVA (cerebral infarction) Active Problems:   History of subarachnoid hemorrhage   CAD (coronary artery disease)   CKD (chronic kidney disease), stage IV   Acute encephalopathy   HTN (hypertension)    Time spent: 25 minutes.     Sidi Dzikowski  [facility] Pager [phone]. If 7PM-7AM, please contact night-coverage at [email or web address], password [facility] 11/13/2013, 2:44 PM  LOS: 5 days

## 2013-11-13 NOTE — Progress Notes (Signed)
Rehab Admissions Coordinator Note:  Patient was screened by Cleatrice Burke for appropriateness for an Inpatient Acute Rehab Consult.  At this time, we are recommending Inpatient Rehab consult for OT recommends inpt rehab consult if family can offer 24/7 assist at d/c. Please order if you feel appropriate.   Cleatrice Burke 11/13/2013, 5:21 PM  I can be reached at [phone].

## 2013-11-14 ENCOUNTER — Encounter (HOSPITAL_COMMUNITY): Payer: Self-pay | Admitting: Gastroenterology

## 2013-11-14 ENCOUNTER — Encounter (HOSPITAL_COMMUNITY)
Admission: EM | Disposition: A | Discharge status: Skilled Nursing Facility | Payer: Self-pay | Source: Home / Self Care | Attending: Internal Medicine

## 2013-11-14 DIAGNOSIS — I6330 Cerebral infarction due to thrombosis of unspecified cerebral artery: Secondary | ICD-10-CM

## 2013-11-14 DIAGNOSIS — I6350 Cerebral infarction due to unspecified occlusion or stenosis of unspecified cerebral artery: Principal | ICD-10-CM

## 2013-11-14 HISTORY — PX: TEE WITHOUT CARDIOVERSION: SHX5443

## 2013-11-14 HISTORY — PX: LOOP RECORDER IMPLANT: SHX5477

## 2013-11-14 LAB — CBC
HCT: 37.4 % (ref 36.0–46.0)
Hemoglobin: 12.5 g/dL (ref 12.0–15.0)
MCH: 28 pg (ref 26.0–34.0)
MCHC: 33.4 g/dL (ref 30.0–36.0)
MCV: 83.9 fL (ref 78.0–100.0)
Platelets: 229 10*3/uL (ref 150–400)
RBC: 4.46 MIL/uL (ref 3.87–5.11)
RDW: 16.1 % — ABNORMAL HIGH (ref 11.5–15.5)
WBC: 4.7 10*3/uL (ref 4.0–10.5)

## 2013-11-14 LAB — CREATININE, SERUM
CREATININE: 3.22 mg/dL — AB (ref 0.50–1.10)
GFR calc Af Amer: 18 mL/min — ABNORMAL LOW (ref >90)
GFR calc non Af Amer: 16 mL/min — ABNORMAL LOW (ref >90)

## 2013-11-14 SURGERY — LOOP RECORDER IMPLANT
Anesthesia: LOCAL

## 2013-11-14 SURGERY — ECHOCARDIOGRAM, TRANSESOPHAGEAL
Anesthesia: Moderate Sedation

## 2013-11-14 MED ORDER — ACETAMINOPHEN 325 MG PO TABS: 325.0000 mg | ORAL_TABLET | ORAL | Status: DC | PRN

## 2013-11-14 MED ORDER — LIDOCAINE-EPINEPHRINE 1 %-1:100000 IJ SOLN
INTRAMUSCULAR | Status: AC
  Filled 2013-11-14: qty 1

## 2013-11-14 MED ORDER — FENTANYL CITRATE 0.05 MG/ML IJ SOLN
INTRAMUSCULAR | Status: DC | PRN
  Administered 2013-11-14 (×2): 25 ug via INTRAVENOUS

## 2013-11-14 MED ORDER — ONDANSETRON HCL 4 MG/2ML IJ SOLN: 4.0000 mg | Freq: Four times a day (QID) | INTRAMUSCULAR | Status: DC | PRN

## 2013-11-14 MED ORDER — BUTAMBEN-TETRACAINE-BENZOCAINE 2-2-14 % EX AERO
INHALATION_SPRAY | CUTANEOUS | Status: DC | PRN
  Administered 2013-11-14: 2 via TOPICAL

## 2013-11-14 MED ORDER — SODIUM CHLORIDE 0.9 % IV SOLN: INTRAVENOUS | Status: DC

## 2013-11-14 MED ORDER — SODIUM CHLORIDE 0.9 % IV SOLN
INTRAVENOUS | Status: DC
  Administered 2013-11-14: 500 mL via INTRAVENOUS

## 2013-11-14 MED ORDER — FENTANYL CITRATE 0.05 MG/ML IJ SOLN
INTRAMUSCULAR | Status: AC
  Filled 2013-11-14: qty 2

## 2013-11-14 MED ORDER — VANCOMYCIN HCL IN DEXTROSE 1-5 GM/200ML-% IV SOLN: 1000.0000 mg | INTRAVENOUS | Status: DC

## 2013-11-14 MED ORDER — MIDAZOLAM HCL 5 MG/ML IJ SOLN
INTRAMUSCULAR | Status: AC
  Filled 2013-11-14: qty 1

## 2013-11-14 MED ORDER — MIDAZOLAM HCL 10 MG/2ML IJ SOLN
INTRAMUSCULAR | Status: DC | PRN
  Administered 2013-11-14: 1 mg via INTRAVENOUS
  Administered 2013-11-14 (×2): 2 mg via INTRAVENOUS

## 2013-11-14 MED ORDER — SODIUM CHLORIDE 0.9 % IR SOLN: 80.0000 mg | Status: DC

## 2013-11-14 MED ORDER — HEPARIN SODIUM (PORCINE) 5000 UNIT/ML IJ SOLN
5000.0000 [IU] | Freq: Three times a day (TID) | INTRAMUSCULAR | Status: DC
  Administered 2013-11-14 – 2013-11-15 (×3): 5000 [IU] via SUBCUTANEOUS
  Filled 2013-11-14 (×5): qty 1

## 2013-11-14 NOTE — CV Procedure (Signed)
TEE  No thrombus Normal EF No PFO

## 2013-11-14 NOTE — Progress Notes (Signed)
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 Repeated CT head show: Moderate-sized area of hypoattenuation left posterior frontal and  parietal cortex and deep white matter. Gray-white differentiation is obscured. Concern raised for acute left MCA territory infarction Since patient has aneurysmal clipping patient cannot get MRI brain done. ammonia levels 28, TSH 1.5, ABG no hypercapnia. and drug screen negative. We will continue Keppra. Neuro following.  Continue with aspirin, lipitor.  Unable to contact family 1-23 to get consent for TEE.  She had repeat CT overnight 1-22 due to AMS, difficult to wake up.  Patient more alert today. Aphasic.  TEE and loop recorder today.   2-Hypertension: continue with Norvasc and clonidine, Imdur and Labetalol. IV hydralazine prn, IV labetalol prn.    3-Initial hypotension and hypothermia - presently hypothermia and hypotension has improved.  blood cultures no growth to date.   4-Recently admitted for hypertensive urgency -  5-CAD status post stenting - since patient had some new EKG changes in the inferolateral leads. Troponin times 2 negative. 6-History of subarachnoid hemorrhage status post aneurysmal clipping. 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 Closely follow intake output metabolic panel. Cr stable at 3..6.    Code Status: Full Code.  Family Communication: Daughter 1-26.  Disposition Plan: waiting for CIR evaluation. TEE and loop recorder today.    Consultants:  Neurology.   CArdiology  Procedures: Carotid doppler: Mild technical difficulty on the right distal ICA due to Tortuosity. Bilateral - 1% to 39% ICA  stenosis. Vertebral artery flow is antegrade.  ECHO;  - Left ventricle: The cavity size was normal. Wall thickness was increased in a pattern of mild LVH. Systolic function was normal. The estimated ejection fraction was in the range of 60% to 65%. Wall motion was normal; there were no regional wall motion abnormalities. Left ventricular diastolic function parameters were normal. - Right atrium: The atrium was mildly dilated. - Pericardium, extracardiac: A small to moderate, free-flowing pericardial effusion was identified circumferential to the heart. The fluid had no internal echoes.There was no evidence of hemodynamic compromise. Impressions:  - No cardiac source of emboli was indentified.  Antibiotics:  none  HPI/Subjective: Alert, sitting in chair.  Aphasic. Calm Multiple family member at bedside.   Objective: Filed Vitals:   11/14/13 1446  BP: 149/44  Pulse: 62  Temp: 97.4 F (36.3 C)  Resp: 14    Intake/Output Summary (Last 24 hours) at 11/14/13 1511 Last data filed at 11/14/13 0932  Gross per 24 hour  Intake    600 ml  Output      0 ml  Net    600 ml   Filed Weights   11/08/13 2120  Weight: 51.574 kg (113 lb 11.2 oz)    Exam:   General: Alert, following command.   Cardiovascular: S 1, S 2 RRR  Respiratory: CTA  Abdomen: BS present, soft, NT  Musculoskeletal: no edema  Neuro; alert, following command, aphasic, repeating words.   Data Reviewed: Basic Metabolic Panel:  Recent Labs Lab 11/08/13 1535 11/10/13 1305 11/11/13 0405 11/13/13 0430  NA 141 144 146 140  K 5.2 4.6 4.8 4.1  CL 101 105 108 105  CO2 24 21 22  18*  GLUCOSE 93 108*  89 129*  BUN 33* 36* 35* 45*  CREATININE 3.85* 3.74* 3.73* 3.60*  CALCIUM 9.3 9.5 9.5 9.1   Liver Function Tests:  Recent Labs Lab 11/08/13 1535  AST 34  ALT 19  ALKPHOS 107  BILITOT 0.3  PROT 6.8  ALBUMIN 2.9*   No results found for this basename: LIPASE, AMYLASE,  in the last 168  hours  Recent Labs Lab 11/08/13 2245  AMMONIA 28   CBC:  Recent Labs Lab 11/08/13 1535 11/10/13 1305 11/11/13 0405  WBC 6.0 7.0 6.4  NEUTROABS 4.7  --   --   HGB 13.1 13.2 12.8  HCT 37.9 39.6 39.3  MCV 83.3 84.4 85.1  PLT 291 238 242   Cardiac Enzymes:  Recent Labs Lab 11/08/13 1535 11/08/13 2245 11/09/13 0433 11/09/13 1040  TROPONINI <0.30 <0.30 <0.30 <0.30   BNP (last 3 results)  Recent Labs  05/07/13 0500 10/13/13 2210 10/22/13 0534  PROBNP 20527.0* >70000.0* 42680.0*   CBG:  Recent Labs Lab 11/08/13 1229 11/08/13 2040  GLUCAP 99 89    Recent Results (from the past 240 hour(s))  CULTURE, BLOOD (ROUTINE X 2)     Status: None   Collection Time    11/08/13 10:45 PM      Result Value Range Status   Specimen Description BLOOD LEFT ARM   Final   Special Requests BOTTLES DRAWN AEROBIC ONLY 5CC   Final   Culture  Setup Time     Final   Value: 11/09/2013 03:26     Performed at [facility]   Culture     Final   Value:        BLOOD CULTURE RECEIVED NO GROWTH TO DATE CULTURE WILL BE HELD FOR 5 DAYS BEFORE ISSUING A FINAL NEGATIVE REPORT     Performed at [facility]   Report Status PENDING   Incomplete  CULTURE, BLOOD (ROUTINE X 2)     Status: None   Collection Time    11/08/13 11:38 PM      Result Value Range Status   Specimen Description BLOOD LEFT HAND   Final   Special Requests BOTTLES DRAWN AEROBIC AND ANAEROBIC 10CC EACH   Final   Culture  Setup Time     Final   Value: 11/09/2013 03:26     Performed at [facility]   Culture     Final   Value:        BLOOD CULTURE RECEIVED NO GROWTH TO DATE CULTURE WILL BE HELD FOR 5 DAYS BEFORE ISSUING A FINAL NEGATIVE REPORT     Performed at [facility]   Report Status PENDING   Incomplete     Studies: No results found.  Scheduled Meds: . [MAR HOLD] amLODipine  10 mg Oral Daily  . [facility] HOLD] aspirin  300 mg Rectal Daily   Or  . [facility] HOLD] aspirin EC  325 mg Oral  Daily  . [facility] HOLD] atorvastatin  80 mg Oral q1800  . [facility] HOLD] cloNIDine  0.3 mg Oral TID  . [MAR HOLD] feeding supplement (ENSURE COMPLETE)  237 mL Oral BID BM  . [facility] HOLD] isosorbide mononitrate  60 mg Oral Daily  . [facility] HOLD] labetalol  300 mg Oral TID  . [facility] HOLD] levETIRAcetam  500 mg Intravenous BID  . [facility] HOLD] tamsulosin  0.4 mg Oral Daily   Continuous Infusions: . sodium chloride 500 mL (11/14/13 1453)    Principal Problem:   CVA (cerebral infarction) Active  Problems:   History of subarachnoid hemorrhage   CAD (coronary artery disease)   CKD (chronic kidney disease), stage IV   Acute encephalopathy   HTN (hypertension)    Time spent: 25 minutes.     REGALADO,BELKYS  [facility] Pager [phone]. If 7PM-7AM, please contact night-coverage at [email or web address], password [facility] 11/14/2013, 3:11 PM  LOS: 6 days

## 2013-11-14 NOTE — Progress Notes (Signed)
Physical Therapy Treatment Patient Details Name: [name] MRN: [id number] DOB: [birth year] Today's Date: 11/14/2013 Time: 3382-5053 PT Time Calculation (min): 30 min  PT Assessment / Plan / Recommendation  History of Present Illness Pt admitted with malignant HTN, n/v, abdominal pain and SOB. Pt found to have moderate sized acute ischemic left MCA  territory infarct involving the left frontoparietal region   PT Comments   Since last PT treatment pt found to have lt CVA. 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 While assisting pt to amb pt grabbed container of melted ice cream off of table and tried to drink it - spilling most of it on the floor.  Pt continued to try and drink from container even though it was empty. Feel pt could benefit from CIR.  Follow Up Recommendations  CIR     Does the patient have the potential to tolerate intense rehabilitation     Barriers to Discharge        Equipment Recommendations  Other (comment) (TBD)    Recommendations for Other Services Rehab consult  Frequency Min 3X/week   Progress towards PT Goals Progress towards PT goals: Goals met and updated - see care plan  Plan Discharge plan needs to be updated    Precautions / Restrictions Precautions Precautions: Fall Precaution Comments: aphasic and apraxic   Pertinent Vitals/Pain VSS    Mobility  Bed Mobility Bed Mobility: Supine to Sit Supine to sit: Min assist General bed mobility comments: Assist to initiate. Transfers Overall transfer level: Needs assistance Equipment used: 1 person hand held assist Sit to Stand: Min assist Stand pivot transfers: Min assist General transfer comment: Assist to initiate and for balance Ambulation/Gait Ambulation/Gait assistance: Min assist Ambulation Distance (Feet): 70 Feet Assistive device: 1 person hand held assist Gait Pattern/deviations: Step-through pattern;Decreased step length - right;Decreased step length -  left;Narrow base of support Gait velocity interpretation: Below normal speed for age/gender General Gait Details: Assist to initiate and for balance. Modified Rankin (Stroke Patients Only) Pre-Morbid Rankin Score: Slight disability Modified Rankin: Moderately severe disability    Exercises     PT Diagnosis:    PT Problem List:   PT Treatment Interventions:     PT Goals (current goals can now be found in the care plan section) Acute Rehab PT Goals Patient Stated Goal: unable to state due to not talking PT Goal Formulation: Patient unable to participate in goal setting Time For Goal Achievement: 11/21/13 Potential to Achieve Goals: Good  Visit Information  Last PT Received On: 11/14/13 Assistance Needed: +1 History of Present Illness: Pt admitted with malignant HTN, n/v, abdominal pain and SOB. Pt found to have moderate sized acute ischemic left MCA  territory infarct involving the left frontoparietal region    Subjective Data  Patient Stated Goal: unable to state due to not talking   Cognition  Cognition Arousal/Alertness: Awake/alert Behavior During Therapy: Flat affect Overall Cognitive Status: Impaired/Different from baseline Area of Impairment: Following commands;Problem solving Following Commands: Follows one step commands inconsistently Problem Solving: Decreased initiation;Requires tactile cues General Comments: Needs tactile cues and gestures to initiate task. Apraxic.   Difficult to assess due to: Impaired communication    Balance  Balance Sitting-balance support: Feet supported Sitting balance-Leahy Scale: Good Standing balance support: Single extremity supported Standing balance-Leahy Scale: Fair  End of Session PT - End of Session Equipment Utilized During Treatment: Gait belt Activity Tolerance: Patient tolerated treatment well Patient left: in chair;with call bell/phone within reach;with chair alarm  set Nurse Communication: Mobility status   GP      [facility] 11/14/2013, 11:29 AM  Suanne Marker PT [phone]

## 2013-11-14 NOTE — Progress Notes (Signed)
SPEECH PATHOLOGY CANCELLATION  Attempted to see patient for f/u assessment of diet tolerance.  Patient currently gone for TEE.    MD, please order Speech-language evaluation, as pt. Has aphasia, per report.  Quinn Axe T CCC-SLP

## 2013-11-14 NOTE — Progress Notes (Signed)
Restrain order expired: all restrains removed per protocol, will continue to monitor.  Lowell Guitar

## 2013-11-14 NOTE — Progress Notes (Addendum)
Stroke Team Progress Note  HISTORY [name] is a 53 y.o. female with multiple medical problems including HTN, hypercholesterolemia, smoking, CAD, MI, chronic diastolic heart failure, [facility] 6/13 s/p clipping and VP shunt, CKD stage 3, brought to [facility] ED 11/08/2013 by family due to generalized weakness, not eating or drinking, and not talking with family and friends for the past few days.  Upon arrival to the ED she was reportedly able to answer some questions but at time of exam she just said no to any questions and was not really able to engage in conversation and thus all information was obtained from chart review.  Work up includeded CT brain without acute abnormality, normal white count, creatinine of 3.85 but normal electrolytes. TSH and ammonia in progress.  On keppra 500 mg BID ( unclear if she ever had a seizure).  Neurology followed. Felt she had likely embolic infarct causing aphasia. 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 There appear to be multiple areas which would strongly suggest embolic source. She will need embolic workup.   Patient was not administerd TPA secondary to stroke not recognized on arrival - non focal exam in setting of multiple medical issues. She was admitted for further evaluation and treatment.  SUBJECTIVE No family members present this morning. No mention of family contact since they were called to sign consent for TEE on Friday. The patient remains aphasic.   OBJECTIVE Most recent Vital Signs: Filed Vitals:   11/14/13 0142 11/14/13 0338 11/14/13 0649 11/14/13 0836  BP: 174/81 178/78 178/74 188/85  Pulse: 71 73  72  Temp:  98.2 F (36.8 C)  98.5 F (36.9 C)  TempSrc:  Oral  Oral  Resp:  16  18  Height:      Weight:      SpO2: 99% 100%  97%   CBG (last 3)  No results found for this basename: GLUCAP,  in the last 72 hours  IV Fluid Intake:     MEDICATIONS  . amLODipine  10 mg Oral Daily  . aspirin  300 mg  Rectal Daily   Or  . aspirin EC  325 mg Oral Daily  . atorvastatin  80 mg Oral q1800  . cloNIDine  0.3 mg Oral TID  . feeding supplement (ENSURE COMPLETE)  237 mL Oral BID BM  . isosorbide mononitrate  60 mg Oral Daily  . labetalol  300 mg Oral TID  . levETIRAcetam  500 mg Intravenous BID  . tamsulosin  0.4 mg Oral Daily   PRN:  hydrALAZINE, labetalol, senna-docusate  Diet:  Cardiac thin liquids Activity:  OOB with assistance DVT Prophylaxis:  SCDs   CLINICALLY SIGNIFICANT STUDIES Basic Metabolic Panel:   Recent Labs Lab 11/11/13 0405 11/13/13 0430  NA 146 140  K 4.8 4.1  CL 108 105  CO2 22 18*  GLUCOSE 89 129*  BUN 35* 45*  CREATININE 3.73* 3.60*  CALCIUM 9.5 9.1   Liver Function Tests:   Recent Labs Lab 11/08/13 1535  AST 34  ALT 19  ALKPHOS 107  BILITOT 0.3  PROT 6.8  ALBUMIN 2.9*   CBC:   Recent Labs Lab 11/08/13 1535 11/10/13 1305 11/11/13 0405  WBC 6.0 7.0 6.4  NEUTROABS 4.7  --   --   HGB 13.1 13.2 12.8  HCT 37.9 39.6 39.3  MCV 83.3 84.4 85.1  PLT 291 238 242   Coagulation: No results found for this basename: LABPROT, INR,  in the last 168 hours Cardiac Enzymes:   Recent Labs Lab 11/08/13 2245 11/09/13 0433 11/09/13 1040  TROPONINI <0.30 <0.30 <0.30   Urinalysis:   Recent Labs Lab 11/08/13 1304  COLORURINE YELLOW  LABSPEC 1.009  PHURINE 7.5  GLUCOSEU NEGATIVE  HGBUR NEGATIVE  BILIRUBINUR NEGATIVE  KETONESUR NEGATIVE  PROTEINUR NEGATIVE  UROBILINOGEN 0.2  NITRITE NEGATIVE  LEUKOCYTESUR NEGATIVE   Lipid Panel    Component Value Date/Time   CHOL 122 11/09/2013 0433   TRIG 92 11/09/2013 0433   HDL 47 11/09/2013 0433   CHOLHDL 2.6 11/09/2013 0433   VLDL 18 11/09/2013 0433   LDLCALC 57 11/09/2013 0433   HgbA1C  Lab Results  Component Value Date   HGBA1C 5.9* 11/09/2013    Urine Drug Screen:     Component Value Date/Time   LABOPIA NONE DETECTED 11/08/2013 1304   LABOPIA NEGATIVE 09/25/2012 2042   COCAINSCRNUR NONE  DETECTED 11/08/2013 1304   COCAINSCRNUR NEGATIVE 09/25/2012 2042   LABBENZ NONE DETECTED 11/08/2013 1304   LABBENZ NEGATIVE 09/25/2012 2042   AMPHETMU NONE DETECTED 11/08/2013 1304   AMPHETMU NEGATIVE 09/25/2012 2042   THCU NONE DETECTED 11/08/2013 1304   LABBARB NONE DETECTED 11/08/2013 1304    Alcohol Level: No results found for this basename: ETH,  in the last 168 hours   CT of the brain   11/11/2013     Interval evolution of moderate sized acute ischemic left MCA territory infarct involving the left frontoparietal region. No evidence of hemorrhagic conversion. No midline shift.    11/10/2013     Moderate-sized area of hypoattenuation left posterior frontal and parietal cortex and deep white matter. Gray-white differentiation is obscured. Concern raised for acute left MCA territory infarction. No visible hemorrhage or midline shift.    2D Echocardiogram  EF 60-65% with no source of embolus.   Carotid Doppler  No evidence of hemodynamically significant internal carotid artery stenosis. Vertebral artery flow is antegrade.   TEE - pending (family has not been able to be contacted for consent)  EKG  normal sinus rhythm. For complete results please see formal report.   Therapy Recommendations SNF  Physical Exam General: The patient is sleepy, but will arouse at the time of examination. Skin: No significant peripheral edema is noted. Neurologic Exam Mental status: The patient is aphasic, is unable to state name, place, day. Cranial nerves: Facial symmetry is present. Speech is aphasic. Patient will perseverate. The patient will follow some verbal commands. Says " doing OK" Likely right field cut due to his responsiveness to direct threat. Pupils equal and reactive to light. Extraocular movements were full. Motor: The patient has good strength in all 4 extremities. Estimated at least 4/5. No drift noted. No obvious hemiparesis is seen. Sensory examination: The patient does respond to pain  stimuli on all fours. Coordination: The patient is unable to follow commands well for cerebellar testing. Gait and station: The gait is not tested. Reflexes: Deep tendon reflexes are symmetric.   ASSESSMENT Ms. [name] is a 53 y.o. female presenting with altered mental status initially. CT -  Interval evolution of moderate sized acute ischemic left MCA territory infarct involving the left frontoparietal region. Infarct felt to be embolic with unknown source.  On no antithrombotics prior to admission. Now on aspirin 325 mg orally every day for secondary stroke prevention. Patient with resultant lethargy, global aphasia, no weakness . TEE recommended but no family available to sign consent. IP rehab also recommended if family can provide care  at discharge, otherwise, SNF.  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 TEE on hold until family available/agreeable to sign consent  CIR (if family can provide care at discharge) vs skilled nursing facility placement Hypertension has been difficult to control.  Burnetta Sabin, MSN, RN, ANVP-BC, ANP-BC, [facility] Pager: [phone] 11/14/2013 2:46 PM  I have personally obtained a history, examined the patient, evaluated imaging results, and formulated the assessment and plan of care. I agree with the above. Antony Contras, MD

## 2013-11-14 NOTE — CV Procedure (Signed)
EP Procedure Note  Procedure: Insertion of an ILR  Indication: Cryptogenic stroke  Findings: After informed consent was obtained, the patient was take to the diagnostic EP Lab in the fasting state. After the usual preparation and draping, 30 cc of lidocaine was infiltrated into the subcutaneous space over the left pectoral region. A 1 cm stab incision was made and the Medtronic ILR was inserted into the subcutaneous space. Benzoin and steri-strips were painted on the skin and a pressure dressing was placed and the patient was returned to her room in satisfactory condition.  Complications: none immediately  Conclusion: Successful insertion of a Medtronic ILR with out immediate complication.  Mikle Bosworth.D.

## 2013-11-14 NOTE — Consult Note (Signed)
Physical Medicine and Rehabilitation Consult Reason for Consult: CVA Referring Physician: [facility]   HPI: [name] is a 53 y.o. right-handed female with history of hypertension, tobacco abuse, CAD with stenting and diastolic congestive heart failure, chronic renal insufficiency baseline creatinine 3.39 as well as subarachnoid hemorrhage June of 2013 status post clipping. Admitted 11/08/2013 with altered mental status and not following commands. Cranial CT scan showed left MCA territory infarct without midline shift. Echocardiogram with ejection fraction 65% no wall motion abnormalities without emboli. Carotid Dopplers with no ICA stenosis. EEG with mild generalized nonspecific cerebral dysfunction suspect encephalopathy. Patient remains on [address] for history of seizure. Neurology consulted patient did not receive TPA. Placed on aspirin for CVA prophylaxis. Patient is tolerating a regular diet. A waist belt was in place for patient safety due to confusion and lack of awareness. Physical and occupational therapy evaluations completed. Recommendations have been made for physical medicine rehabilitation consult   Review of Systems  Unable to perform ROS: mental acuity   Past Medical History  Diagnosis Date  . HTN (hypertension)     Resistant  . Hypercholesterolemia   . Tobacco abuse     Resumed smoking half a pack a day. She was a smoker in the past and states she was abl eto stop in the past using nicotine patches  . Coronary atherosclerosis of native coronary artery     STEMI Feb 2009 - 70% distal LAD lesion c/w plaque rupture, DES distal RCA  . CKD (chronic kidney disease) stage 3, GFR 30-59 ml/min     Most recent creatinine 1.4 in 3/10  . Diastolic heart failure     LVEF 60-65% with grade 2 diastolic dysfunction - July 2014  . Subarachnoid hemorrhage     03/2012  . Aneurysm    Past Surgical History  Procedure Laterality Date  . Ventriculoperitoneal shunt  03/27/2012    Procedure: SHUNT  INSERTION VENTRICULAR-PERITONEAL;  Surgeon: Winfield Cunas, MD;  Location: [facility];  Service: Neurosurgery;  Laterality: N/A;  Ventricular-Peritoneal Shunt Insertion  . Abdominal hysterectomy    . Bascilic vein transposition Right 05/13/2013    Procedure: [place];  Surgeon: Rosetta Posner, MD;  Location: [facility] OR;  Service: Vascular;  Laterality: Right;   Family History  Problem Relation Age of Onset  . Coronary artery disease Other     Premature in 1st degree relatives  . Heart disease Mother   . Heart disease Father    Social History:  reports that she has been smoking Cigarettes.  She has a 7 pack-year smoking history. She has never used smokeless tobacco. She reports that she does not drink alcohol or use illicit drugs. Allergies:  Allergies  Allergen Reactions  . Ampicillin Hives and Rash  . Penicillins Hives and Rash   Medications Prior to Admission  Medication Sig Dispense Refill  . amLODipine (NORVASC) 10 MG tablet Take 1 tablet (10 mg total) by mouth daily.  30 tablet  2  . atorvastatin (LIPITOR) 80 MG tablet Take 80 mg by mouth daily.      . calcitRIOL (ROCALTROL) 0.25 MCG capsule Take 1 capsule (0.25 mcg total) by mouth every Monday, Wednesday, and Friday.  30 capsule  0  . camphor-menthol (SARNA) lotion Apply 1 application topically as needed for itching.      . cloNIDine (CATAPRES) 0.3 MG tablet Take 1 tablet (0.3 mg total) by mouth 3 (three) times daily.  90 tablet  2  . furosemide (LASIX) 80 MG  tablet Take 1 tablet (80 mg total) by mouth 2 (two) times daily.  60 tablet  2  . isosorbide mononitrate (IMDUR) 30 MG 24 hr tablet Take 2 tablets (60 mg total) by mouth daily.  60 tablet  2  . labetalol (NORMODYNE) 200 MG tablet Take 1.5 tablets (300 mg total) by mouth 3 (three) times daily.  135 tablet  2  . levETIRAcetam (KEPPRA) 500 MG tablet Take 500 mg by mouth 2 (two) times daily.      Marland Kitchen levofloxacin (LEVAQUIN) 250 MG tablet Take 1 tablet (250 mg total) by  mouth every other day.  2 tablet  0  . minoxidil (LONITEN) 2.5 MG tablet Take 2 tablets (5 mg total) by mouth 2 (two) times daily.  120 tablet  2  . nitroGLYCERIN (NITROSTAT) 0.4 MG SL tablet Place 1 tablet (0.4 mg total) under the tongue every 5 (five) minutes as needed for chest pain.  20 tablet  0  . omeprazole (PRILOSEC) 20 MG capsule Take 20 mg by mouth daily.      . sodium bicarbonate 650 MG tablet Take 1 tablet (650 mg total) by mouth 3 (three) times daily.  90 tablet  1  . tamsulosin (FLOMAX) 0.4 MG CAPS capsule Take 0.4 mg by mouth daily.        Home: Home Living Family/patient expects to be discharged to:: Private residence Living Arrangements: Children Available Help at Discharge: Family;Available 24 hours/day Type of Home: Apartment Home Access: Stairs to enter Entrance Stairs-Number of Steps: 13 Entrance Stairs-Rails: Right Home Layout: One level Home Equipment: None Additional Comments: Information above was from previous encounter.  No family present and pt not conversing much this admit.    Functional History: Prior Function Comments: unsure of prior functional level.  Functional Status:  Mobility:          ADL: ADL Eating/Feeding: Other (comment) (will assess) Grooming: Maximal assistance Where Assessed - Grooming: Supported standing Upper Body Bathing: Maximal assistance Where Assessed - Upper Body Bathing: Supported sit to stand Lower Body Bathing: Maximal assistance Where Assessed - Lower Body Bathing: Supported sit to stand Upper Body Dressing: Maximal assistance Where Assessed - Upper Body Dressing: Supported sitting Lower Body Dressing: Maximal assistance Where Assessed - Lower Body Dressing: Supported sit to Lobbyist: Minimal assistance Toilet Transfer Method: Sit to stand;Other (comment) (ambulaing) Equipment Used: Gait belt Transfers/Ambulation Related to ADLs: MinA sit -stand and ambulation ADL Comments: limitedprimarily by  cognitive and sensory deficits  Cognition: Cognition Overall Cognitive Status: Impaired/Different from baseline Orientation Level: Disoriented to person;Disoriented to place;Disoriented to time;Disoriented to situation Cognition Arousal/Alertness: Awake/alert Behavior During Therapy: Flat affect Overall Cognitive Status: Impaired/Different from baseline Difficult to assess due to: Impaired communication  Blood pressure 188/85, pulse 72, temperature 98.5 F (36.9 C), temperature source Oral, resp. rate 18, height 5' (1.524 m), weight 51.574 kg (113 lb 11.2 oz), SpO2 97.00%. Physical Exam  Vitals reviewed. HENT:  Head: Normocephalic.  Eyes: EOM are normal.  Neck: Normal range of motion. Neck supple. No thyromegaly present.  Cardiovascular: Normal rate and regular rhythm.   Respiratory: Effort normal and breath sounds normal. No respiratory distress.  GI: Soft. Bowel sounds are normal. She exhibits no distension.  Neurological: She is alert.  Sitting in cuddled position in chair. Keeps eyes closed.  She has very poor awareness of her deficits. She was inconsistent in follow commands. She was able to awaken to verbal and tactile stim but arousal was limited. Moved right side preferentially.  No results found for this or any previous visit (from the past 24 hour(s)). No results found.  Assessment/Plan: Diagnosis: left MCA infarct 1. Does the need for close, 24 hr/day medical supervision in concert with the patient's rehab needs make it unreasonable for this patient to be served in a less intensive setting? Yes 2. Co-Morbidities requiring supervision/potential complications: cad, ckd, htn3 3. Due to bladder management, bowel management, safety, skin/wound care, disease management, medication administration, pain management and patient education, does the patient require 24 hr/day rehab nursing? Potentially 4. Does the patient require coordinated care of a physician, rehab nurse, PT  (1-2 hrs/day, 5 days/week), OT (1-2 hrs/day, 5 days/week) and SLP (1-2 hrs/day, 5 days/week) to address physical and functional deficits in the context of the above medical diagnosis(es)? Yes and Potentially Addressing deficits in the following areas: balance, endurance, locomotion, strength, transferring, bowel/bladder control, bathing, dressing, feeding, grooming, toileting, cognition, speech, language, swallowing and psychosocial support 5. Can the patient actively participate in an intensive therapy program of at least 3 hrs of therapy per day at least 5 days per week? Potentially 6. The potential for patient to make measurable gains while on inpatient rehab is good and fair 7. Anticipated functional outcomes upon discharge from inpatient rehab are mod assist with PT, mod assist with OT, mod assist with SLP. 8. Estimated rehab length of stay to reach the above functional goals is: 20-30 days? 9. Does the patient have adequate social supports to accommodate these discharge functional goals? Potentially 10. Anticipated D/C setting: Home 11. Anticipated post D/C treatments: [place] therapy 12. Overall Rehab/Functional Prognosis: good and fair  RECOMMENDATIONS: This patient's condition is appropriate for continued rehabilitative care in the following setting: potentially CIR vs SNF Patient has agreed to participate in recommended program. not app Note that insurance prior authorization may be required for reimbursement for recommended care.  Comment: Family member in the room (?sister) who was a poor historian frankly. Stated that she would do whatever was needed at home. Apparently the patient has daughters at home who could potentially help? If she displays consistent ability to participate in CIR therapies and family is aware of potential discharge goals, we could consider an admission.  Rehab Admissions Coordinator to follow up.  Thanks,  Meredith Staggers, MD, [facility]     11/14/2013

## 2013-11-14 NOTE — Progress Notes (Signed)
Echocardiogram Endo TEE has been performed.  Joelene Millin 11/14/2013, 4:57 PM

## 2013-11-15 ENCOUNTER — Encounter (HOSPITAL_COMMUNITY): Payer: Self-pay | Admitting: Cardiology

## 2013-11-15 LAB — CULTURE, BLOOD (ROUTINE X 2)
Culture: NO GROWTH
Culture: NO GROWTH

## 2013-11-15 MED ORDER — ENSURE COMPLETE PO LIQD: 237.0000 mL | Freq: Two times a day (BID) | ORAL | Status: DC

## 2013-11-15 MED ORDER — MINOXIDIL 2.5 MG PO TABS: 2.5000 mg | ORAL_TABLET | Freq: Every day | ORAL | Status: DC

## 2013-11-15 MED ORDER — ASPIRIN 325 MG PO TBEC: 325.0000 mg | DELAYED_RELEASE_TABLET | Freq: Every day | ORAL | Status: DC

## 2013-11-15 MED ORDER — LEVETIRACETAM 500 MG PO TABS
500.0000 mg | ORAL_TABLET | Freq: Two times a day (BID) | ORAL | Status: DC
  Filled 2013-11-15 (×2): qty 1

## 2013-11-15 MED ORDER — FUROSEMIDE 80 MG PO TABS: 40.0000 mg | ORAL_TABLET | Freq: Every day | ORAL | Status: DC

## 2013-11-15 NOTE — Progress Notes (Signed)
NUTRITION FOLLOW UP  DOCUMENTATION CODES  Per approved criteria   -Severe malnutrition in the context of chronic illness    Intervention:    Continue Ensure Complete PO BID, each supplement provides 350 kcal and 13 grams of protein.  Nutrition Dx:   Increased nutrient needs related to malnutrition, repletion as evidenced by estimated nutrition needs. Ongoing.  Goal:   Intake to meet >90% of estimated nutrition needs. Unmet.  Monitor:   PO intake, labs, weight trend.  Assessment:   Patient with history of HTN, CKD, CAD status post stenting and history of subarachnoid hemorrhage status post aneurysmal clipping in 2013 was brought to the ER after patient was found to be increasingly confused and not following commands.  Patient in room, but unable to provide any nutrition information. PO intake is poor; patient is consuming 0-25% of meals. Ensure bottle in room opened, but full.   Height: Ht Readings from Last 1 Encounters:  11/08/13 5' (1.524 m)    Weight Status:   Wt Readings from Last 1 Encounters:  11/08/13 113 lb 11.2 oz (51.574 kg)    Re-estimated needs:  Kcal: 1500-1700  Protein: 70-80 gm  Fluid: 1.5-1.7 L  Skin: no wounds  Diet Order: Dysphagia 1 with thin liquids   Intake/Output Summary (Last 24 hours) at 11/15/13 1429 Last data filed at 11/15/13 1252  Gross per 24 hour  Intake     10 ml  Output      0 ml  Net     10 ml    Last BM: 1/26   Labs:   Recent Labs Lab 11/10/13 1305 11/11/13 0405 11/13/13 0430 11/14/13 1930  NA 144 146 140  --   K 4.6 4.8 4.1  --   CL 105 108 105  --   CO2 21 22 18*  --   BUN 36* 35* 45*  --   CREATININE 3.74* 3.73* 3.60* 3.22*  CALCIUM 9.5 9.5 9.1  --   GLUCOSE 108* 89 129*  --     CBG (last 3)  No results found for this basename: GLUCAP,  in the last 72 hours  Scheduled Meds: . amLODipine  10 mg Oral Daily  . aspirin  300 mg Rectal Daily   Or  . aspirin EC  325 mg Oral Daily  . atorvastatin  80 mg  Oral q1800  . cloNIDine  0.3 mg Oral TID  . feeding supplement (ENSURE COMPLETE)  237 mL Oral BID BM  . heparin subcutaneous  5,000 Units Subcutaneous Q8H  . isosorbide mononitrate  60 mg Oral Daily  . labetalol  300 mg Oral TID  . levETIRAcetam  500 mg Oral BID  . tamsulosin  0.4 mg Oral Daily    Continuous Infusions: None   Molli Barrows, RD, LDN, [place] Pager [phone] After Hours Pager [phone]

## 2013-11-15 NOTE — Discharge Summary (Addendum)
Physician Discharge Summary  [name] WYS:[id number] DOB: 28-Aug-1961 DOA: 11/08/2013  PCP: No PCP Per Patient  Admit date: 11/08/2013 Discharge date: 11/15/2013  Time spent: 35 minutes  Recommendations for Outpatient Follow-up:  1. Please repeat B-met to follow renal function on resume dose of lasix.  2. Needs further adjustment of BP medications.  3. Need speech therapy.   Discharge Diagnoses:    CVA (cerebral infarction)   History of subarachnoid hemorrhage   CAD (coronary artery disease)   CKD (chronic kidney disease), stage IV   Acute encephalopathy   HTN (hypertension)   Discharge Condition: Stable. .   Diet recommendation: Heart Healthy  Filed Weights   11/08/13 2120  Weight: 51.574 kg (113 lb 11.2 oz)    History of present illness:  [name] is a 53 y.o. female history of hypertension, chronic kidney disease, CAD status post stenting and history of subarachnoid hemorrhage status post aneurysmal clipping in 2013 was brought to the ER after patient was found to be increasingly confused and not following commands. Patient was admitted last week to this hospital for hypertensive urgency and at that time patient's medications were adjusted and patient was placed in addition on minoxidil. Patient also was placed on Levaquin for possible bronchitis really dosed. Patient was discharged yesterday and as per patient's daughter patient had done well last evening but towards night patient has become increasingly confused and incontinent of urine. Today morning was completely confused and not following commands and by evening since patient's symptoms were persistent was brought to the ER. On exam patient is nonfocal but was completely disoriented. Moves all extremities. CT head did not show anything acute. Patient otherwise is afebrile and as per patient's daughter patient did not have any nausea vomiting diarrhea abdominal pain chest pain or shortness of breath. Patient was found to  be cold by patient's daughter. On arrival in the ER patient was initially mildly hypotensive and hypothermic which had improved. Patient is on Keppra and is not sure if it is just as prophylaxis for for known seizures as patient's daughter states that she does not know if her mom had seizures previously.   Hospital Course:  1-Moderate sized acute ischemic left MCA territory infarct involving the left frontoparietal region;  Patient presents with AMS, encephalopathy. Repeated CT head show: Moderate-sized area of hypoattenuation left posterior frontal and parietal cortex and deep white matter. Gray-white differentiation is obscured. Concern raised for acute left MCA territory infarction.  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 ammonia levels 28, TSH 1.5, ABG no hypercapnia. and drug screen negative. We will continue Keppra.  Continue with aspirin, lipitor.  She had repeat CT overnight 1-22 due to AMS, difficult to wake up.  Patient  alert today. Aphasic.  TEE negative for source embolism. Patient S/P placement loop recoder.   2-Hypertension: continue with Norvasc and clonidine, Imdur and Labetalol. IV hydralazine prn, IV labetalol prn. BP still elevated will resume lower dose of minoxidil. Adjust medications as needed.  3-Initial hypotension and hypothermia - presently hypothermia and hypotension has improved. blood cultures no growth to date.  4-Recently admitted for hypertensive urgency -  5-CAD status post stenting - since patient had some new EKG changes in the inferolateral leads. Troponin times 2 negative.  6-History of subarachnoid hemorrhage status post aneurysmal clipping.  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 Closely follow intake output metabolic  panel. Cr stable at 3..6. Resume lower dose lasix.    Procedures: Carotid doppler: Mild technical difficulty  on the right distal ICA due to Tortuosity. Bilateral - 1% to 39% ICA stenosis. Vertebral artery flow is antegrade.  ECHO;  - Left ventricle: The cavity size was normal. Wall thickness was increased in a pattern of mild LVH. Systolic function was normal. The estimated ejection fraction was in the range of 60% to 65%. Wall motion was normal; there were no regional wall motion abnormalities. Left ventricular diastolic function parameters were normal. - Right atrium: The atrium was mildly dilated. - Pericardium, extracardiac: A small to moderate, free-flowing pericardial effusion was identified circumferential to the heart. The fluid had no internal echoes.There was no evidence of hemodynamic compromise. Impressions:  - No cardiac source of emboli was indentified.  TEE: No thrombus  Normal EF  No PFO   Consultations:  neurology  Discharge Exam: Filed Vitals:   11/15/13 1245  BP: 179/77  Pulse: 97  Temp: 97.8 F (36.6 C)  Resp:     General: No distress.  Cardiovascular: S 1, S 2 RRR Respiratory: CTA Neuro; aphasic, alert following command.   Discharge Instructions  Discharge Orders   Future Appointments Provider Department Dept Phone   11/16/2013 12:00 PM Renella Cunas, MD [facility] [phone]   Future Orders Complete By Expires   Diet - low sodium heart healthy  As directed    Increase activity slowly  As directed        Medication List    STOP taking these medications       levofloxacin 250 MG tablet  Commonly known as:  LEVAQUIN      TAKE these medications       amLODipine 10 MG tablet  Commonly known as:  NORVASC  Take 1 tablet (10 mg total) by mouth daily.     aspirin 325 MG EC tablet  Take 1 tablet (325 mg total) by mouth daily.     atorvastatin 80 MG tablet  Commonly known as:  LIPITOR  Take 80 mg by mouth daily.     calcitRIOL 0.25 MCG capsule  Commonly known as:  ROCALTROL  Take 1 capsule (0.25 mcg  total) by mouth every Monday, Wednesday, and Friday.     camphor-menthol lotion  Commonly known as:  SARNA  Apply 1 application topically as needed for itching.     cloNIDine 0.3 MG tablet  Commonly known as:  CATAPRES  Take 1 tablet (0.3 mg total) by mouth 3 (three) times daily.     feeding supplement (ENSURE COMPLETE) Liqd  Take 237 mLs by mouth 2 (two) times daily between meals.     furosemide 80 MG tablet  Commonly known as:  LASIX  Take 0.5 tablets (40 mg total) by mouth daily.     isosorbide mononitrate 30 MG 24 hr tablet  Commonly known as:  IMDUR  Take 2 tablets (60 mg total) by mouth daily.     labetalol 200 MG tablet  Commonly known as:  NORMODYNE  Take 1.5 tablets (300 mg total) by mouth 3 (three) times daily.     levETIRAcetam 500 MG tablet  Commonly known as:  KEPPRA  Take 500 mg by mouth 2 (two) times daily.     minoxidil 2.5 MG tablet  Commonly known as:  LONITEN  Take 1 tablet (2.5 mg total) by mouth daily.     nitroGLYCERIN 0.4 MG SL tablet  Commonly known as:  NITROSTAT  Place 1 tablet (0.4 mg total) under the tongue every 5 (five) minutes as needed for chest pain.     omeprazole 20 MG capsule  Commonly known as:  PRILOSEC  Take 20 mg by mouth daily.     sodium bicarbonate 650 MG tablet  Take 1 tablet (650 mg total) by mouth 3 (three) times daily.     tamsulosin 0.4 MG Caps capsule  Commonly known as:  FLOMAX  Take 0.4 mg by mouth daily.       Allergies  Allergen Reactions  . Ampicillin Hives and Rash  . Penicillins Hives and Rash       Follow-up Information   Follow up with [facility] On 11/16/2013. (_0 :00 with MD wall. Please have d/c summary at time of visit. )    Contact information:   [place] [id number] [phone]      Follow up with Forbes Cellar, MD In 1 month.   Specialties:  Neurology, Radiology   Contact information:   [address] [place]  [place] [address] [phone]        The results of significant diagnostics from this hospitalization (including imaging, microbiology, ancillary and laboratory) are listed below for reference.    Significant Diagnostic Studies: Dg Chest 2 View  11/08/2013   CLINICAL DATA:  Chest pain.  EXAM: CHEST  2 VIEW  COMPARISON:  11/03/2013  FINDINGS: Moderate enlargement of the cardiopericardial silhouette. Normal mediastinal and hilar contours. Clear lungs. No pleural effusion or pneumothorax.  Bony thorax is intact. Stable right anterior chest wall ventriculoperitoneal shunt catheter.  IMPRESSION: No acute cardiopulmonary disease.   Electronically Signed   By: Lajean Manes M.D.   On: 11/08/2013 13:47   Dg Chest 2 View  11/03/2013   CLINICAL DATA:  Smoker, cough, congestion, shortness of breath, chest pain  EXAM: CHEST  2 VIEW  COMPARISON:  10/20/2013  FINDINGS: Moderate cardiac enlargement. Mild vascular congestion less prominent when compared to the prior study. There is mild bilateral lower lobe atelectasis. There is no evidence of pulmonary edema or consolidation. There are no pleural effusions. Right sided VP shunt tubing is again identified.  IMPRESSION: Stable cardiac enlargement.  No acute findings.   Electronically Signed   By: Skipper Cliche M.D.   On: 11/03/2013 08:08   Dg Chest 2 View  10/20/2013   CLINICAL DATA:  Cough, congestion, fever  EXAM: CHEST  2 VIEW  COMPARISON:  10/13/2013  FINDINGS: Moderate cardiac enlargement. VP shunt tubing projects over the right thorax. There is moderate vascular congestion, which represents a change from the prior study. The perihilar vessels are mildly indistinct.  IMPRESSION: Congestive heart failure with mild pulmonary edema. Vascular congestion is worse when compared to the prior study.   Electronically Signed   By: Skipper Cliche M.D.   On: 10/20/2013 21:02   Ct Head Wo Contrast  11/11/2013   CLINICAL DATA:  Mental status changes  EXAM: CT HEAD WITHOUT  CONTRAST  TECHNIQUE: Contiguous axial images were obtained from the base of the skull through the vertex without intravenous contrast.  COMPARISON:  Prior CT from 11/10/2013  FINDINGS: Right frontal approach VP shunt catheter is stable in position with stable ventricular size. No hydrocephalus. 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 Sequelae of prior surgical clipping of right ICA and MCA aneurysms also present.  There has been interval evolution of irregular wedge  shaped hypodensity involving the cortical gray matter and subcortical white matter left frontoparietal region, consistent with evolving acute ischemic left-sided MCA territory infarct. No evidence of hemorrhagic conversion. Overall distribution of is infarct is similar as compared to prior exam. No new large vessel territory infarct identified. There is no midline shift. No extra-axial fluid collection.  Remote lacunar infarcts involving the genu of the corpus callosum as well as the left basal ganglia again noted, unchanged. Remote left cerebellar infarct also unchanged. Prominent vascular calcifications again noted.  Sequelae of prior right frontotemporal craniotomy again noted. Calvarium is otherwise intact. Orbits are normal. Paranasal sinuses and mastoid air cells remain clear.  IMPRESSION: Interval evolution of moderate sized acute ischemic left MCA territory infarct involving the left frontoparietal region. No evidence of hemorrhagic conversion. No midline shift.   Electronically Signed   By: Jeannine Boga M.D.   On: 11/11/2013 05:25   Ct Head Wo Contrast  11/10/2013   CLINICAL DATA:  Altered mental status.  Possible aphasia.  EXAM: CT HEAD WITHOUT CONTRAST  TECHNIQUE: Contiguous axial images were obtained from the base of the skull through the vertex without contrast.  COMPARISON:  CT HEAD W/O CM dated 11/08/2013; CT HEAD W/O CM dated 11/03/2013; CT HEAD W/O CM dated 09/25/2013; CT  HEAD W/O CM dated 05/06/2013  FINDINGS: Chronic changes with prior right internal carotid artery aneurysm clipping, basilar tip coiling, and a right MCA aneurysm clipping. Prior right craniotomy and right ventriculoperitoneal shunt. Stable ventricular size.  New area of fairly well-demarcated hypoattenuation affecting the cortex and deep white matter of the left posterior frontoparietal lobe without hemorrhage. Concern raised for acute cerebral infarction. Significant interval change compared with 11/08/13. No evidence for proximal vascular thrombosis on CT (dense MCA sign).  Calvarium otherwise intact. Advanced vascular calcification proximally. No sinus or mastoid disease.  IMPRESSION: Moderate-sized area of hypoattenuation left posterior frontal and parietal cortex and deep white matter. Gray-white differentiation is obscured. Concern raised for acute left MCA territory infarction. No visible hemorrhage or midline shift.   Electronically Signed   By: Rolla Flatten M.D.   On: 11/10/2013 16:24   Ct Head Wo Contrast  11/08/2013   CLINICAL DATA:  Altered mental status  EXAM: CT HEAD WITHOUT CONTRAST  TECHNIQUE: Contiguous axial images were obtained from the base of the skull through the vertex without intravenous contrast.  COMPARISON:  Prior CT from 11/03/2013  FINDINGS: Right frontal approach ventricular catheter is stable in position with tip positioned near the third ventricle. Hypodensity along the ventricular catheter within the right frontal lobe is unchanged. Coil embolization with aneurysm clip again noted. Ventricle size is slightly increased without evidence of hydrocephalus.  No midline shift or mass effect. No mass lesion. No acute intracranial hemorrhage or infarct No extra-axial fluid collection. Remote left cerebellar infarct noted. Remote lacunar infarct involving the left caudate head is also unchanged.  Calvarium is intact. Sequelae of prior right temporal craniotomy again noted.  Paranasal  sinuses and mastoid air cells are clear.  IMPRESSION: 1. No acute intracranial abnormality. 2. Stable position of right frontal approach ventricular catheter. Overall ventricular size is slightly increased as compared to 11/03/13 without evidence of hydrocephalus.   Electronically Signed   By: Jeannine Boga M.D.   On: 11/08/2013 13:45   Ct Head Wo Contrast  11/03/2013   CLINICAL DATA:  Altered mental status.  EXAM: CT HEAD WITHOUT CONTRAST  TECHNIQUE: Contiguous axial images were obtained from the base of the skull through the vertex  without intravenous contrast.  COMPARISON:  CT scan of September 25, 2013.  FINDINGS: Status post right temporal craniotomy. Ventriculoperitoneal shunt is seen entering right frontal skull with tip in left lateral ventricle which is unchanged in position compared to prior exam. 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 Ventricular size is stable compared to prior exam. No mass effect or midline shift is noted. There is no evidence of mass lesion, acute hemorrhage or acute infarction.  IMPRESSION: Right frontal ventriculoperitoneal shunt is unchanged in position. Ventricular size is within normal limits and unchanged compared to prior exam. No acute intracranial abnormality is noted.   Electronically Signed   By: Sabino Dick M.D.   On: 11/03/2013 08:28    Microbiology: Recent Results (from the past 240 hour(s))  CULTURE, BLOOD (ROUTINE X 2)     Status: None   Collection Time    11/08/13 10:45 PM      Result Value Range Status   Specimen Description BLOOD LEFT ARM   Final   Special Requests BOTTLES DRAWN AEROBIC ONLY 5CC   Final   Culture  Setup Time     Final   Value: 11/09/2013 03:26     Performed at [facility]   Culture     Final   Value: NO GROWTH 5 DAYS     Performed at [facility]   Report Status 11/15/2013 FINAL   Final  CULTURE, BLOOD (ROUTINE X 2)     Status: None   Collection Time    11/08/13 11:38 PM      Result Value  Range Status   Specimen Description BLOOD LEFT HAND   Final   Special Requests BOTTLES DRAWN AEROBIC AND ANAEROBIC 10CC EACH   Final   Culture  Setup Time     Final   Value: 11/09/2013 03:26     Performed at [facility]   Culture     Final   Value: NO GROWTH 5 DAYS     Performed at [facility]   Report Status 11/15/2013 FINAL   Final     Labs: Basic Metabolic Panel:  Recent Labs Lab 11/08/13 1535 11/10/13 1305 11/11/13 0405 11/13/13 0430 11/14/13 1930  NA 141 144 146 140  --   K 5.2 4.6 4.8 4.1  --   CL 101 105 108 105  --   CO2 _0 18*  --   GLUCOSE 93 108* 89 129*  --   BUN 33* 36* 35* 45*  --   CREATININE 3.85* 3.74* 3.73* 3.60* 3.22*  CALCIUM 9.3 9.5 9.5 9.1  --    Liver Function Tests:  Recent Labs Lab 11/08/13 1535  AST 34  ALT 19  ALKPHOS 107  BILITOT 0.3  PROT 6.8  ALBUMIN 2.9*   No results found for this basename: LIPASE, AMYLASE,  in the last 168 hours  Recent Labs Lab 11/08/13 2245  AMMONIA 28   CBC:  Recent Labs Lab 11/08/13 1535 11/10/13 1305 11/11/13 0405 11/14/13 1930  WBC 6.0 7.0 6.4 4.7  NEUTROABS 4.7  --   --   --   HGB 13.1 13.2 12.8 12.5  HCT 37.9 39.6 39.3 37.4  MCV 83.3 84.4 85.1 83.9  PLT 291 238 242 229   Cardiac Enzymes:  Recent Labs Lab 11/08/13 1535 11/08/13 2245 11/09/13 0433 11/09/13 1040  TROPONINI <0.30 <0.30 <0.30 <0.30   BNP: BNP (last 3 results)  Recent Labs  05/07/13 0500 10/13/13 2210 10/22/13 0534  PROBNP 20527.0* >70000.0* 42680.0*   CBG:  Recent Labs Lab 11/08/13 2040  GLUCAP 89       Signed:  [id number]  [facility] 11/15/2013, 3:00 PM

## 2013-11-15 NOTE — Progress Notes (Signed)
Pt is being discharged to to [facility]. Attempted to call report to nurse, I was put on hold for aprox 30 min. Will call back later.

## 2013-11-15 NOTE — Progress Notes (Signed)
I attempted to call [facility] again. No one answered the telephone after ringing multiples times.  Ferdinand Lango, RN

## 2013-11-15 NOTE — Progress Notes (Signed)
Rehab admissions - Evaluated for possible admission.  I called and spoke with 2 daughters.  Patient stays with her younger daughter who has 4 children and several people in her home.  Patient would need to go up a flight of stairs to her bedroom.  Both daughters would like a SNF for patient on [address]  I have called the case manager and will let the social worker know to pursue SNF.  Call me for questions.  [phone]

## 2013-11-15 NOTE — Progress Notes (Signed)
CSW (Clinical Social Worker) prepared pt dc packet and placed with shadow chart. CSW arranged non-emergent ambulance transport. Pt family, pt nurse, and facility informed. CSW signing off.  Jadira Nierman, LCSWA [phone]  

## 2013-11-15 NOTE — Progress Notes (Signed)
Speech Language Pathology Treatment: Dysphagia  Patient Details Name: [name] MRN: [id number] DOB: May 22, 1961 Today's Date: 11/15/2013 Time: [id number] SLP Time Calculation (min): 10 min  Assessment / Plan / Recommendation Clinical Impression  F/u for swallowing.  Pt requiring encouragement to eat.  Per RD, PO intake has been poor.  Today, consumed limited boluses of thin liquids without overt difficulty.  Extremely impulsive, consuming contents of container of sherbet with enormous spoons-ful, demonstrating perseverative behaviors with self-feeding and difficulty with object use (spoon, towel for cleaning face.)  Required max assist for precautions, safety.  Pt with poor insight into deficits without recognition nor self-correction.  Requires full supervision with meals for safety.  SLP to continue to follow for aphasia and dysphagia.     HPI HPI: 53 y.o. female presenting with altered mental status initially. CT -  Interval evolution of moderate sized acute ischemic left MCA territory infarct involving the left frontoparietal region. Infarct felt to be embolic with unknown source   Pertinent Vitals No c/o pain  SLP Plan  Continue with current plan of care    Recommendations Diet recommendations: Dysphagia 1 (puree);Thin liquid Liquids provided via: Cup Medication Administration: Whole meds with puree Supervision: Patient able to self feed;Full supervision/cueing for compensatory strategies Compensations: Slow rate;Small sips/bites;Check for pocketing Postural Changes and/or Swallow Maneuvers: Seated upright 90 degrees              Follow up Recommendations: Skilled Nursing facility Plan: Continue with current plan of care   Latham Kinzler L. Tivis Ringer, [place] CCC/SLP Pager [phone]      [name] 11/15/2013, 3:00 PM

## 2013-11-15 NOTE — Progress Notes (Signed)
Stroke Team Progress Note  HISTORY [name] is a 53 y.o. female with multiple medical problems including HTN, hypercholesterolemia, smoking, CAD, MI, chronic diastolic heart failure, [facility] 6/13 s/p clipping and VP shunt, CKD stage 3, brought to [facility] ED 11/08/2013 by family due to generalized weakness, not eating or drinking, and not talking with family and friends for the past few days.  Upon arrival to the ED she was reportedly able to answer some questions but at time of exam she just said no to any questions and was not really able to engage in conversation and thus all information was obtained from chart review.  Work up includeded CT brain without acute abnormality, normal white count, creatinine of 3.85 but normal electrolytes. TSH and ammonia in progress.  On keppra 500 mg BID ( unclear if she ever had a seizure).  Neurology followed. Felt she had likely embolic infarct causing aphasia. 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 There appear to be multiple areas which would strongly suggest embolic source. She will need embolic workup.   Patient was not administerd TPA secondary to stroke not recognized on arrival - non focal exam in setting of multiple medical issues. She was admitted for further evaluation and treatment.  SUBJECTIVE No family at bedside.  OBJECTIVE Most recent Vital Signs: Filed Vitals:   11/14/13 1947 11/14/13 2213 11/15/13 0018 11/15/13 0419  BP: 186/89 170/78 154/77 169/79  Pulse: 65 71 65 65  Temp: 97.3 F (36.3 C)  97.6 F (36.4 C) 98.2 F (36.8 C)  TempSrc: Oral  Oral Oral  Resp:    14  Height:      Weight:      SpO2: 100%  99% 99%   CBG (last 3)  No results found for this basename: GLUCAP,  in the last 72 hours  IV Fluid Intake:     MEDICATIONS  . amLODipine  10 mg Oral Daily  . aspirin  300 mg Rectal Daily   Or  . aspirin EC  325 mg Oral Daily  . atorvastatin  80 mg Oral q1800  . cloNIDine  0.3 mg Oral  TID  . feeding supplement (ENSURE COMPLETE)  237 mL Oral BID BM  . heparin subcutaneous  5,000 Units Subcutaneous Q8H  . isosorbide mononitrate  60 mg Oral Daily  . labetalol  300 mg Oral TID  . levETIRAcetam  500 mg Intravenous BID  . tamsulosin  0.4 mg Oral Daily   PRN:  acetaminophen, hydrALAZINE, labetalol, ondansetron (ZOFRAN) IV, senna-docusate  Diet:  Dysphagia 1 thin liquids Activity:  OOB with assistance DVT Prophylaxis:  SCDs   CLINICALLY SIGNIFICANT STUDIES Basic Metabolic Panel:   Recent Labs Lab 11/11/13 0405 11/13/13 0430 11/14/13 1930  NA 146 140  --   K 4.8 4.1  --   CL 108 105  --   CO2 22 18*  --   GLUCOSE 89 129*  --   BUN 35* 45*  --   CREATININE 3.73* 3.60* 3.22*  CALCIUM 9.5 9.1  --    Liver Function Tests:   Recent Labs Lab 11/08/13 1535  AST 34  ALT 19  ALKPHOS 107  BILITOT 0.3  PROT 6.8  ALBUMIN 2.9*   CBC:   Recent Labs Lab 11/08/13 1535  11/11/13 0405 11/14/13 1930  WBC 6.0  < > 6.4 4.7  NEUTROABS 4.7  --   --   --   HGB 13.1  < >  12.8 12.5  HCT 37.9  < > 39.3 37.4  MCV 83.3  < > 85.1 83.9  PLT 291  < > 242 229  < > = values in this interval not displayed. Coagulation: No results found for this basename: LABPROT, INR,  in the last 168 hours Cardiac Enzymes:   Recent Labs Lab 11/08/13 2245 11/09/13 0433 11/09/13 1040  TROPONINI <0.30 <0.30 <0.30   Urinalysis:   Recent Labs Lab 11/08/13 1304  COLORURINE YELLOW  LABSPEC 1.009  PHURINE 7.5  GLUCOSEU NEGATIVE  HGBUR NEGATIVE  BILIRUBINUR NEGATIVE  KETONESUR NEGATIVE  PROTEINUR NEGATIVE  UROBILINOGEN 0.2  NITRITE NEGATIVE  LEUKOCYTESUR NEGATIVE   Lipid Panel    Component Value Date/Time   CHOL 122 11/09/2013 0433   TRIG 92 11/09/2013 0433   HDL 47 11/09/2013 0433   CHOLHDL 2.6 11/09/2013 0433   VLDL 18 11/09/2013 0433   LDLCALC 57 11/09/2013 0433   HgbA1C  Lab Results  Component Value Date   HGBA1C 5.9* 11/09/2013    Urine Drug Screen:     Component  Value Date/Time   LABOPIA NONE DETECTED 11/08/2013 1304   LABOPIA NEGATIVE 09/25/2012 2042   COCAINSCRNUR NONE DETECTED 11/08/2013 1304   COCAINSCRNUR NEGATIVE 09/25/2012 2042   LABBENZ NONE DETECTED 11/08/2013 1304   LABBENZ NEGATIVE 09/25/2012 2042   AMPHETMU NONE DETECTED 11/08/2013 1304   AMPHETMU NEGATIVE 09/25/2012 2042   THCU NONE DETECTED 11/08/2013 1304   LABBARB NONE DETECTED 11/08/2013 1304    Alcohol Level: No results found for this basename: ETH,  in the last 168 hours   CT of the brain   11/11/2013    Interval evolution of moderate sized acute ischemic left MCA territory infarct involving the left frontoparietal region. No evidence of hemorrhagic conversion. No midline shift.    11/10/2013    Moderate-sized area of hypoattenuation left posterior frontal and parietal cortex and deep white matter. Gray-white differentiation is obscured. Concern raised for acute left MCA territory infarction. No visible hemorrhage or midline shift.    2D Echocardiogram  EF 60-65% with no source of embolus.   Carotid Doppler  No evidence of hemodynamically significant internal carotid artery stenosis. Vertebral artery flow is antegrade.   TEE - No thrombus. Normal EF. No PFO  EKG  normal sinus rhythm. For complete results please see formal report.   Therapy Recommendations SNF  Physical Exam General: The patient is sleepy, but will arouse at the time of examination. Skin: No significant peripheral edema is noted. Neurologic Exam Mental status: The patient is aphasic, is unable to state name, place, day. Cranial nerves: Facial symmetry is present. Speech is aphasic. Patient will perseverate. The patient will follow some verbal commands. Says " doing OK" Likely right field cut due to his responsiveness to direct threat. Pupils equal and reactive to light. Extraocular movements were full. Motor: The patient has good strength in all 4 extremities. Estimated at least 4/5. No drift noted. No obvious  hemiparesis is seen. Sensory examination: The patient does respond to pain stimuli on all fours. Coordination: The patient is unable to follow commands well for cerebellar testing. Gait and station: The gait is not tested. Reflexes: Deep tendon reflexes are symmetric.   ASSESSMENT [name] is a 53 y.o. female presenting with altered mental status initially. CT -  Interval evolution of moderate sized acute ischemic left MCA territory infarct involving the left frontoparietal region. Infarct felt to be embolic with unknown source. TEE negative. Loop recorder placed. On no  antithrombotics prior to admission. Now on aspirin 325 mg orally every day for secondary stroke prevention. Patient with resultant lethargy, global aphasia, no weakness. IP rehab also recommended if family can provide care at discharge, otherwise, SNF.  Hypertension, elevated 188/85, 202/87 - on norvasc 10, catapress 0.3 tid, imdur 60, labetaol 300 tid.  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 Continue lipitor Cardiology will follow up loop placement in 10 days for a wound check CIR (if family can provide care at discharge) vs skilled nursing facility placement. i have not seen family myself, but agree with consult. Hypertension has been difficult to control.Dr. Leonie Man to discuss tx option with Dr. Tyrell Antonio. Defer tx to her.  No further stroke workup indicated.  Patient has a 10-15% risk of having another stroke over the next year, the highest risk is within 2 weeks of the most recent stroke/TIA (risk of having a stroke  following a stroke or TIA is the same).  Ongoing risk factor control by Primary Care Physician  Stroke Service will sign off. Please call should any needs arise.  Follow up with Dr. Leonie Man, [facility], in 2 months.  Burnetta Sabin, MSN, RN, ANVP-BC, ANP-BC, [facility] Pager: [phone] 11/15/2013 9:40 AM  I have personally obtained a history, examined the patient, evaluated imaging results, and formulated the assessment and plan of care. I agree with the above.  Antony Contras, MD

## 2013-11-15 NOTE — Evaluation (Signed)
Speech Language Pathology Evaluation Patient Details Name: [name] MRN: [id number] DOB: 04-Feb-1961 Today's Date: 11/15/2013 Time: [id number] SLP Time Calculation (min): 10 min  Problem List:  Patient Active Problem List   Diagnosis Date Noted  . CVA (cerebral infarction) 11/11/2013  . Altered mental status 11/08/2013  . Acute encephalopathy 11/08/2013  . HTN (hypertension) 11/08/2013  . Hypertensive renal disease 11/06/2013  . Protein-calorie malnutrition, severe 11/04/2013  . Seizure disorder 10/22/2013  . Hypertensive urgency 10/20/2013  . Hypertensive heart disease 05/07/2013  . Acute-on-chronic renal failure 05/07/2013  . HTN (hypertension), malignant 05/06/2013  . CAD (coronary artery disease) 05/06/2013  . CKD (chronic kidney disease), stage IV 05/06/2013  . Chronic diastolic heart failure   . Tobacco abuse 09/25/2012  . H/O noncompliance with medical treatment, presenting hazards to health   . Renal artery stenosis 04/05/2012  . History of subarachnoid hemorrhage 03/03/2012  . Acute on chronic diastolic heart failure [id number]   Past Medical History:  Past Medical History  Diagnosis Date  . HTN (hypertension)     Resistant  . Hypercholesterolemia   . Tobacco abuse     Resumed smoking half a pack a day. She was a smoker in the past and states she was abl eto stop in the past using nicotine patches  . Coronary atherosclerosis of native coronary artery     STEMI Feb 2009 - 70% distal LAD lesion c/w plaque rupture, DES distal RCA  . CKD (chronic kidney disease) stage 3, GFR 30-59 ml/min     Most recent creatinine 1.4 in 3/10  . Diastolic heart failure     LVEF 60-65% with grade 2 diastolic dysfunction - July 2014  . Subarachnoid hemorrhage     03/2012  . Aneurysm    Past Surgical History:  Past Surgical History  Procedure Laterality Date  . Ventriculoperitoneal shunt  03/27/2012    Procedure: SHUNT INSERTION VENTRICULAR-PERITONEAL;  Surgeon: Winfield Cunas, MD;   Location: [facility];  Service: Neurosurgery;  Laterality: N/A;  Ventricular-Peritoneal Shunt Insertion  . Abdominal hysterectomy    . Bascilic vein transposition Right 05/13/2013    Procedure: [place];  Surgeon: Rosetta Posner, MD;  Location: [facility];  Service: Vascular;  Laterality: Right;  . Tee without cardioversion N/A 11/14/2013    Procedure: TRANSESOPHAGEAL ECHOCARDIOGRAM (TEE);  Surgeon: Candee Furbish, MD;  Location: [facility]COPY;  Service: Cardiovascular;  Laterality: N/A;   HPI: 53 y.o. female with multiple medical problems including HTN, hypercholesterolemia, smoking, CAD, MI, chronic diastolic heart failure, [facility] 6/13 s/p clipping and VP shunt, CKD stage 3, brought to [facility] ED 11/08/2013 by family due to generalized weakness, not eating or drinking, and not talking with family and friends for the past few days.  CT -  Interval evolution of moderate sized acute ischemic left MCA territory infarct involving the left frontoparietal region. Infarct felt to be embolic with unknown source.  Pt with cognitive deficits, CIR admission 6/13.  Uncertain baseline function prior to this admission.   Assessment / Plan / Recommendation Clinical Impression  Pt with hx of cognitive deficits (baseline unknown) now with an acute aphasia.  Spontaneous verbalizations are characterized by short phrases, yes/no responses, deficits in naming.  Pt requires max verbal/tactile/visual cues and salient context in order to follow simple commands.  Appears frustrated with clinician efforts to elicit language.  Pt with apraxic motor output, as well as impulsivity and perseverative responses (both verbal and motor.)  Rec acute SLP f/u to address aphasia; CIR  is evaluating for appropriateness.      SLP Assessment  Patient needs continued Speech Lanaguage Pathology Services    Follow Up Recommendations   (tba)    Frequency and Duration min 3x week  2 weeks   Pertinent Vitals/Pain No indication of pain    SLP Goals  SLP Goals Potential to Achieve Goals: Fair Potential Considerations: Ability to learn/carryover information;Previous level of function;Cooperation/participation level Progress/Goals/Alternative treatment plan discussed with pt/caregiver and they: No caregivers available  SLP Evaluation Prior Functioning  Cognitive/Linguistic Baseline: Baseline deficits Baseline deficit details: cognitive deficits at time of D/C from CIR several years ago   Cognition  Overall Cognitive Status: Impaired/Different from baseline Arousal/Alertness: Awake/alert Attention: Sustained Sustained Attention: Impaired Sustained Attention Impairment: Verbal basic;Functional basic    Comprehension  Auditory Comprehension Overall Auditory Comprehension: Impaired Yes/No Questions: Impaired Basic Biographical Questions: 0-25% accurate Commands: Impaired One Step Basic Commands: 0-24% accurate Conversation: Simple Visual Recognition/Discrimination Discrimination: Exceptions to [facility] Common Objects: Able in field of 2 Reading Comprehension Reading Status: Not tested    Expression Expression Primary Mode of Expression: Verbal Verbal Expression Overall Verbal Expression: Impaired Initiation: Impaired Level of Generative/Spontaneous Verbalization: Phrase Repetition: Impaired Level of Impairment: Word level Naming: Impairment Responsive: 0-25% accurate Confrontation: Impaired Common Objects: Able in field of 2 Convergent: 0-24% accurate Verbal Errors: Semantic paraphasias Pragmatics: Impairment Written Expression Dominant Hand: Right Written Expression: Not tested   Oral / Motor Oral Motor/Sensory Function Overall Oral Motor/Sensory Function: Impaired at baseline Motor Speech Overall Motor Speech: Appears within functional limits for tasks assessed   Adreonna Yontz L. Tivis Ringer, [place] CCC/SLP Pager [phone]      [name] 11/15/2013, 2:45 PM

## 2013-11-15 NOTE — Discharge Instructions (Signed)
STROKE/TIA DISCHARGE INSTRUCTIONS SMOKING Cigarette smoking nearly doubles your risk of having a stroke & is the single most alterable risk factor  If you smoke or have smoked in the last 12 months, you are advised to quit smoking for your health.  Most of the excess cardiovascular risk related to smoking disappears within a year of stopping.  Ask you doctor about anti-smoking medications  [place] Quit Line: 1-800-QUIT NOW  Free Smoking Cessation Classes [phone]  CHOLESTEROL Know your levels; limit fat & cholesterol in your diet  Lipid Panel     Component Value Date/Time   CHOL 122 11/09/2013 0433   TRIG 92 11/09/2013 0433   HDL 47 11/09/2013 0433   CHOLHDL 2.6 11/09/2013 0433   VLDL 18 11/09/2013 0433   LDLCALC 57 11/09/2013 0433      Many patients benefit from treatment even if their cholesterol is at goal.  Goal: Total Cholesterol (CHOL) less than 160  Goal:  Triglycerides (TRIG) less than 150  Goal:  HDL greater than 40  Goal:  LDL (LDLCALC) less than 100   BLOOD PRESSURE American Stroke Association blood pressure target is less that 120/80 mm/Hg  Your discharge blood pressure is:  BP: 196/89 mmHg  Monitor your blood pressure  Limit your salt and alcohol intake  Many individuals will require more than one medication for high blood pressure  DIABETES (A1c is a blood sugar average for last 3 months) Goal HGBA1c is under 7% (HBGA1c is blood sugar average for last 3 months)  Diabetes: No known diagnosis of diabetes    Lab Results  Component Value Date   HGBA1C 5.9* 11/09/2013     Your HGBA1c can be lowered with medications, healthy diet, and exercise.  Check your blood sugar as directed by your physician  Call your physician if you experience unexplained or low blood sugars.  PHYSICAL ACTIVITY/REHABILITATION Goal is 30 minutes at least 4 days per week  Activity: Increase activity slowly,, No driving, and Walk with assistance, Therapies: Physical Therapy: Nursing  Facility, Occupational Therapy: Nursing Facility and Speech Therapy: [place] Return to work: After rehab  Activity decreases your risk of heart attack and stroke and makes your heart stronger.  It helps control your weight and blood pressure; helps you relax and can improve your mood.  Participate in a regular exercise program.  Talk with your doctor about the best form of exercise for you (dancing, walking, swimming, cycling).  DIET/WEIGHT Goal is to maintain a healthy weight  Your discharge diet is: Dysphagia1 ( puree) thin liquids Your height is:  Height: 5' (152.4 cm) Your current weight is: Weight: 51.574 kg (113 lb 11.2 oz) Your Body Mass Index (BMI) is:  BMI (Calculated): 22.3  Following the type of diet specifically designed for you will help prevent another stroke.  Your goal weight range is:  90-110    Your goal Body Mass Index (BMI) is 19-24.  Healthy food habits can help reduce 3 risk factors for stroke:  High cholesterol, hypertension, and excess weight.  RESOURCES Stroke/Support Group:  Call [phone]   STROKE EDUCATION PROVIDED/REVIEWED AND GIVEN TO PATIENT Stroke warning signs and symptoms How to activate emergency medical system (call 911). Medications prescribed at discharge. Need for follow-up after discharge. Personal risk factors for stroke. Pneumonia vaccine given: No Flu vaccine given: No My questions have been answered, the writing is legible, and I understand these instructions.  I will adhere to these goals & educational materials that have been provided to  me after my discharge from the hospital.

## 2013-11-15 NOTE — Progress Notes (Signed)
Occupational Therapy Treatment Patient Details Name: [name] MRN: [id number] DOB: [birth year] Today's Date: 11/15/2013 Time:  -     OT Assessment / Plan / Recommendation  History of present illness Pt admitted with malignant HTN, n/v, abdominal pain and SOB. Pt found to have moderate sized acute ischemic left MCA  territory infarct involving the left frontoparietal region   OT comments  Pt. Awake upon arrival.  Only nodding yes or no.  Did begin some verbalizing towards end of session.  Initiated tasks but required demonstrational and tactile cues for completion and thoroughness.  Visible frustration when she did not understand instructions.  Did better with hand over hand assistance.  Cont. To rec. CIR if dispo allows.  Follow Up Recommendations  CIR                  Recommendations for Other Services Rehab consult  Frequency Min 3X/week   Progress towards OT Goals Progress towards OT goals: Progressing toward goals  Plan Discharge plan remains appropriate    Precautions / Restrictions Precautions Precautions: Fall Precaution Comments: aphasic and apraxic   Pertinent Vitals/Pain Shakes head in "no" direction when asked    ADL  Grooming: Performed;Wash/dry face;Wash/dry hands;Minimal assistance Where Assessed - Grooming: Supine, head of bed up Lower Body Bathing: Performed;Maximal assistance Where Assessed - Lower Body Bathing: Supine, head of bed up Upper Body Dressing: Performed;Maximal assistance Where Assessed - Upper Body Dressing: Supine, head of bed up ADL Comments: initiated b hand integration for holding washcloth and bringing to face.  required assistance for thoroughness.  attempted front peri care after noted incontinence, pt. with visible frustration and not understanding inst.  provided demonstrational and hand over hand assistance for explanation and pt. attempted but was unable for full task completion.  max a for ub dressing don/doff gown      OT  Goals(current goals can now be found in the care plan section)    Visit Information  Last OT Received On: 11/15/13 History of Present Illness: Pt admitted with malignant HTN, n/v, abdominal pain and SOB. Pt found to have moderate sized acute ischemic left MCA  territory infarct involving the left frontoparietal region                 Cognition  Cognition Arousal/Alertness: Awake/alert Behavior During Therapy: Flat affect Overall Cognitive Status: Impaired/Different from baseline Area of Impairment: Awareness;Safety/judgement Following Commands: Follows one step commands inconsistently Safety/Judgement: Decreased awareness of safety;Decreased awareness of deficits Problem Solving: Decreased initiation;Requires tactile cues General Comments: Needs tactile cues and gestures to initiate task. Apraxic.   Difficult to assess due to: Impaired communication    Mobility  Transfers General transfer comment: initiated movement when asked to scoot up in bed.  able to un cross legs and used  LUE to pull self over in bed with use of bed rail              End of Session OT - End of Session Activity Tolerance: Patient tolerated treatment well Patient left: in bed;with call bell/phone within reach RN notified that 3 pills were found in bed. Pt. With known speech/swallow issues. Reminded rn that this pt. Needs assistance with swallowing.  Also that she was set up for breakfast with no assistance and had not been able to eat any of her meal. (plate was full of food and all beverages full upon arrival)       Janice Coffin, COTA/L 11/15/2013, 11:14 AM

## 2013-11-16 ENCOUNTER — Inpatient Hospital Stay: Payer: Medicare Other | Admitting: Cardiology

## 2013-11-17 ENCOUNTER — Telehealth: Payer: Self-pay | Admitting: Internal Medicine

## 2013-11-17 NOTE — Telephone Encounter (Addendum)
Called pt to schedule wound ck, per sister pt is in hospital and will not be coming home but will go to [place] home, [name] will go to nursing home to do wound ck, I will be in contact will the nursing home re 91 day and future device cks/mt

## 2013-12-23 ENCOUNTER — Telehealth: Payer: Self-pay | Admitting: Neurology

## 2013-12-23 NOTE — Telephone Encounter (Signed)
Please call [facility] [phone] where pt is a resident to r/s pt's apt 02/22/14 due to Dr. Leonie Man will be out of the office. Pt needs a 23mth f/u.

## 2013-12-26 NOTE — Telephone Encounter (Signed)
Called patient resident at [facility] and scheduled an appt for the patient on 01/04/14 with Dr. Leonie Man, 2 mo hosp f/u. I advised the facility that if the patient has any other problems, questions or concerns to call the office. The patient facility verbalized understanding.

## 2014-01-04 ENCOUNTER — Ambulatory Visit: Payer: Self-pay | Admitting: Neurology

## 2014-02-02 ENCOUNTER — Encounter: Payer: Self-pay | Admitting: *Deleted

## 2014-02-22 ENCOUNTER — Ambulatory Visit: Payer: Self-pay | Admitting: Neurology

## 2014-03-01 ENCOUNTER — Ambulatory Visit (INDEPENDENT_AMBULATORY_CARE_PROVIDER_SITE_OTHER): Payer: Medicare Other | Admitting: Neurology

## 2014-03-01 ENCOUNTER — Encounter: Payer: Self-pay | Admitting: Neurology

## 2014-03-01 VITALS — BP 181/93 | HR 79 | Ht 61.5 in | Wt 114.0 lb

## 2014-03-01 DIAGNOSIS — R4701 Aphasia: Secondary | ICD-10-CM

## 2014-03-01 DIAGNOSIS — I6340 Cerebral infarction due to embolism of unspecified cerebral artery: Secondary | ICD-10-CM

## 2014-03-01 NOTE — Progress Notes (Signed)
[facility] [address] [place]. [place] [address] [phone]       OFFICE FOLLOW-UP NOTE  Ms. [name] Date of Birth:  Sep 20, 1961 Medical Record Number:  [id number]   HPI: 45 year African and generalized weakness. Decreased eating and drinking. American lady seen for first office followup visit from hospital consultation. In general 20/15. She had history of prior subarachnoid hemorrhage status post aneurysmal clipping in 2013 and VP shunt with some residual mild cognitive difficulties. She was brought in for sudden onset of increasing confusion and urinary incontinence. Followup CT scan showed a large left MCA infarct involving frontoparietal region. This was felt to be of embolic etiology but no definite source was identified. Transthoraxic echo showed normal ejection fraction. Carotid Doppler showed no extracranial stenosis. TEE showed no thrombus or PFO. Lipid profile showed LDL 57 and she was on Lipitor 80 mg admission. Her blood pressure was elevated during admission. She also has history of tobacco abuse and diastolic heart failure. She was discharged to nursing facility which is presently living. She still has significant language difficulties but can speak a few words and short sentences. She still needs a lot of help with activities of daily living and 24 hour care. She still is  significantly disabled. She is not getting physical occupational and speech therapy regularly at the skilled nursing facility. She had a loop recorder placed but apparently this she has not been seen in the followup device clinic ROS:   14 system review of systems is positive for weight loss, memory loss, difficulty swallowing, seizure, skin sensitivity, moves and all the systems negative  PMH:  Past Medical History  Diagnosis Date  . HTN (hypertension)     Resistant  . Hypercholesterolemia   . Tobacco abuse     Resumed smoking half a pack a day. She was a smoker in the past and  states she was abl eto stop in the past using nicotine patches  . Coronary atherosclerosis of native coronary artery     STEMI Feb 2009 - 70% distal LAD lesion c/w plaque rupture, DES distal RCA  . CKD (chronic kidney disease) stage 3, GFR 30-59 ml/min     Most recent creatinine 1.4 in 3/10  . Diastolic heart failure     LVEF 60-65% with grade 2 diastolic dysfunction - July 2014  . Subarachnoid hemorrhage     03/2012  . Aneurysm     Social History:  History   Social History  . Marital Status: Single    Spouse Name: N/A    Number of Children: N/A  . Years of Education: N/A   Occupational History  . Not on file.   Social History Main Topics  . Smoking status: Current Every Day Smoker -- 0.20 packs/day for 35 years    Types: Cigarettes  . Smokeless tobacco: Never Used     Comment: 1/2 ppd   . Alcohol Use: No  . Drug Use: No  . Sexual Activity: No   Other Topics Concern  . Not on file   Social History Narrative   Lives by herself but helps care for her 8 grandchildren.     Medications:   Current Outpatient Prescriptions on File Prior to Visit  Medication Sig Dispense Refill  . amLODipine (NORVASC) 10 MG tablet Take 1 tablet (10 mg total) by mouth daily.  30 tablet  2  . aspirin EC 325 MG EC tablet Take 1 tablet (325 mg total) by mouth daily.  [place]  tablet  0  . atorvastatin (LIPITOR) 80 MG tablet Take 80 mg by mouth daily.      . calcitRIOL (ROCALTROL) 0.25 MCG capsule Take 1 capsule (0.25 mcg total) by mouth every Monday, Wednesday, and Friday.  30 capsule  0  . camphor-menthol (SARNA) lotion Apply 1 application topically as needed for itching.      . cloNIDine (CATAPRES) 0.3 MG tablet Take 1 tablet (0.3 mg total) by mouth 3 (three) times daily.  90 tablet  2  . feeding supplement, ENSURE COMPLETE, (ENSURE COMPLETE) LIQD Take 237 mLs by mouth 2 (two) times daily between meals.  30 Bottle  0  . furosemide (LASIX) 80 MG tablet Take 0.5 tablets (40 mg total) by mouth daily.   60 tablet  2  . isosorbide mononitrate (IMDUR) 30 MG 24 hr tablet Take 2 tablets (60 mg total) by mouth daily.  60 tablet  2  . labetalol (NORMODYNE) 200 MG tablet Take 1.5 tablets (300 mg total) by mouth 3 (three) times daily.  135 tablet  2  . levETIRAcetam (KEPPRA) 500 MG tablet Take 500 mg by mouth 2 (two) times daily.      . minoxidil (LONITEN) 2.5 MG tablet Take 1 tablet (2.5 mg total) by mouth daily.  30 tablet  0  . nitroGLYCERIN (NITROSTAT) 0.4 MG SL tablet Place 1 tablet (0.4 mg total) under the tongue every 5 (five) minutes as needed for chest pain.  20 tablet  0  . omeprazole (PRILOSEC) 20 MG capsule Take 20 mg by mouth daily.      . sodium bicarbonate 650 MG tablet Take 1 tablet (650 mg total) by mouth 3 (three) times daily.  90 tablet  1  . tamsulosin (FLOMAX) 0.4 MG CAPS capsule Take 0.4 mg by mouth daily.       No current facility-administered medications on file prior to visit.    Allergies:   Allergies  Allergen Reactions  . Ampicillin Hives and Rash  . Penicillins Hives and Rash    Physical Exam General: Frail middle-aged African American lady seated, in no evident distress Head: head normocephalic and atraumatic. Orohparynx benign Neck: supple with no carotid or supraclavicular bruits Cardiovascular: regular rate and rhythm, no murmurs Musculoskeletal: no deformity Skin:  no rash/petichiae Vascular:  Normal pulses all extremities Filed Vitals:   03/01/14 1457  BP: 181/93  Pulse: 79   Neurologic Exam Mental Status: Awake and fully alert. Severe expressive aphasia can speak she short sentences and few words. Follow simple midline and someone stepped commands. Unable to repeat or name. Cranial Nerves: Fundoscopic exam reveals sharp disc margins. Pupils equal, briskly reactive to light. Extraocular movements full without nystagmus. Visual fields decreased blink to threat on the right . Hearing intact. Facial sensation intact. Face, tongue, palate moves normally and  symmetrically.  Motor: Normal bulk and tone. Normal strength in all tested extremity muscles. Sensory.: intact to touch and pinprick and vibratory sensation.  Coordination: Rapid alternating movements normal in all extremities. Finger-to-nose and heel-to-shin performed accurately bilaterally. Gait and Station: Arises from chair without difficulty. Stance is normal. Gait demonstrates slightly broad based but normal stride length and balance . Unable to heel, toe and tandem walk without difficulty.  Reflexes: 1+ and symmetric. Toes downgoing.   NIHSS 7 Modified [place]  4   ASSESSMENT: 38 year African American lady with large left middle cerebral artery infarct in January 123456 of embolic etiology without definite identified source. She still has significant residual aphasia. Vascular risk  factors of hypertension, coronary artery disease and prior history of aneurysmal subarachnoid hemorrhage. Status post clipping in 2013    PLAN: I had a long discussion with the patient and daughter regarding her recent stroke, discussed results of evaluation in the hospital and answered questions. Continue aspirin for stroke prevention and maintain strict control of hypertension with blood pressure goal below 130/90 and lipids his LDL cholesterol goal below 100 milligrams percent. Recommend continuing physical, occupational and speech therapy. Return for followup in the future only as necessary.    Note: This document was prepared with digital dictation and possible smart phrase technology. Any transcriptional errors that result from this process are unintentional

## 2014-03-01 NOTE — Patient Instructions (Signed)
I had a long discussion with the patient and daughter regarding her recent stroke, discussed results of evaluation in the hospital and answered questions. Continue aspirin for stroke prevention and maintain strict control of hypertension with blood pressure goal below 130/90 and lipids his LDL cholesterol goal below 100 milligrams percent. Recommend continuing physical, occupational and speech therapy. Return for followup in the future only as necessary.

## 2014-09-28 ENCOUNTER — Encounter (HOSPITAL_COMMUNITY): Payer: Self-pay | Admitting: Internal Medicine

## 2014-12-08 ENCOUNTER — Encounter: Payer: Self-pay | Admitting: Cardiology

## 2014-12-08 ENCOUNTER — Telehealth: Payer: Self-pay | Admitting: Cardiology

## 2014-12-08 NOTE — Telephone Encounter (Signed)
Attempted to call pt daughter and request a manual transmission from loop recorder home monitor. No answer and unable to leave a message.

## 2014-12-15 ENCOUNTER — Encounter: Payer: Self-pay | Admitting: Cardiology

## 2014-12-22 ENCOUNTER — Encounter: Payer: Self-pay | Admitting: Cardiology

## 2014-12-29 ENCOUNTER — Encounter: Payer: Self-pay | Admitting: Cardiology

## 2015-01-02 ENCOUNTER — Encounter: Payer: Self-pay | Admitting: *Deleted

## 2015-01-05 ENCOUNTER — Encounter: Payer: Self-pay | Admitting: Cardiology

## 2015-01-19 ENCOUNTER — Encounter: Payer: Self-pay | Admitting: Cardiology

## 2016-08-12 ENCOUNTER — Encounter (HOSPITAL_COMMUNITY): Payer: Self-pay

## 2016-08-12 ENCOUNTER — Emergency Department (HOSPITAL_COMMUNITY)
Admission: EM | Admit: 2016-08-12 | Discharge: 2016-08-12 | Disposition: A | Discharge status: Home / Self Care | Payer: Medicare Other | Attending: Emergency Medicine | Admitting: Emergency Medicine

## 2016-08-12 DIAGNOSIS — N184 Chronic kidney disease, stage 4 (severe): Secondary | ICD-10-CM | POA: Diagnosis not present

## 2016-08-12 DIAGNOSIS — Z7982 Long term (current) use of aspirin: Secondary | ICD-10-CM | POA: Diagnosis not present

## 2016-08-12 DIAGNOSIS — I130 Hypertensive heart and chronic kidney disease with heart failure and stage 1 through stage 4 chronic kidney disease, or unspecified chronic kidney disease: Secondary | ICD-10-CM | POA: Insufficient documentation

## 2016-08-12 DIAGNOSIS — F17210 Nicotine dependence, cigarettes, uncomplicated: Secondary | ICD-10-CM | POA: Diagnosis not present

## 2016-08-12 DIAGNOSIS — Z8673 Personal history of transient ischemic attack (TIA), and cerebral infarction without residual deficits: Secondary | ICD-10-CM | POA: Diagnosis not present

## 2016-08-12 DIAGNOSIS — I2510 Atherosclerotic heart disease of native coronary artery without angina pectoris: Secondary | ICD-10-CM | POA: Diagnosis not present

## 2016-08-12 DIAGNOSIS — I5033 Acute on chronic diastolic (congestive) heart failure: Secondary | ICD-10-CM | POA: Diagnosis not present

## 2016-08-12 DIAGNOSIS — I10 Essential (primary) hypertension: Secondary | ICD-10-CM

## 2016-08-12 NOTE — ED Notes (Signed)
MD made aware pt BP 128/84. Pt. Resting comfortably in bed with eyes closed, rise and fall of chest noted, even/unlabored respirations.

## 2016-08-12 NOTE — ED Notes (Signed)
PTAR at bedside for pick up 

## 2016-08-12 NOTE — ED Provider Notes (Signed)
[facility] DEPT Provider Note   CSN: [id number] Arrival date & time: 08/12/16  1331     History   Chief Complaint Chief Complaint  Patient presents with  . Hypertension    HPI [name] is a 55 y.o. female.   Hypertension  This is a chronic problem. The current episode started 1 to 2 hours ago. The problem occurs constantly. The problem has been gradually improving. Pertinent negatives include no chest pain, no abdominal pain, no headaches and no shortness of breath.    Past Medical History:  Diagnosis Date  . Aneurysm ([place])   . CKD (chronic kidney disease) stage 3, GFR 30-59 ml/min    Most recent creatinine 1.4 in 3/10  . Coronary atherosclerosis of native coronary artery    STEMI Feb 2009 - 70% distal LAD lesion c/w plaque rupture, DES distal RCA  . Diastolic heart failure    LVEF 60-65% with grade 2 diastolic dysfunction - July 2014  . HTN (hypertension)    Resistant  . Hypercholesterolemia   . Subarachnoid hemorrhage ([place])    03/2012  . Tobacco abuse    Resumed smoking half a pack a day. She was a smoker in the past and states she was abl eto stop in the past using nicotine patches    Patient Active Problem List   Diagnosis Date Noted  . CVA (cerebral infarction) 11/11/2013  . Altered mental status 11/08/2013  . Acute encephalopathy 11/08/2013  . HTN (hypertension) 11/08/2013  . Hypertensive renal disease 11/06/2013  . Protein-calorie malnutrition, severe ([place]) 11/04/2013  . Seizure disorder ([place]) 10/22/2013  . Hypertensive urgency 10/20/2013  . Hypertensive heart disease 05/07/2013  . Acute-on-chronic renal failure ([place]) 05/07/2013  . HTN (hypertension), malignant 05/06/2013  . CAD (coronary artery disease) 05/06/2013  . CKD (chronic kidney disease), stage IV ([place]) 05/06/2013  . Chronic diastolic heart failure ([place])   . Tobacco abuse 09/25/2012  . H/O noncompliance with medical treatment, presenting hazards to health   . Renal artery stenosis  ([place]) 04/05/2012  . History of subarachnoid hemorrhage 03/03/2012  . Acute on chronic diastolic heart failure ([place]) 09/19/2008    Past Surgical History:  Procedure Laterality Date  . ABDOMINAL HYSTERECTOMY    . BASCILIC VEIN TRANSPOSITION Right 05/13/2013   Procedure: [place];  Surgeon: Rosetta Posner, MD;  Location: [facility];  Service: Vascular;  Laterality: Right;  . BRAIN SURGERY     2   . LOOP RECORDER IMPLANT N/A 11/14/2013   Procedure: LOOP RECORDER IMPLANT;  Surgeon: Evans Lance, MD;  Location: [facility] CATH LAB;  Service: Cardiovascular;  Laterality: N/A;  . TEE WITHOUT CARDIOVERSION N/A 11/14/2013   Procedure: TRANSESOPHAGEAL ECHOCARDIOGRAM (TEE);  Surgeon: Candee Furbish, MD;  Location: [facility] ENDOSCOPY;  Service: Cardiovascular;  Laterality: N/A;  . VENTRICULOPERITONEAL SHUNT  03/27/2012   Procedure: SHUNT INSERTION VENTRICULAR-PERITONEAL;  Surgeon: Winfield Cunas, MD;  Location: [facility] NEURO ORS;  Service: Neurosurgery;  Laterality: N/A;  Ventricular-Peritoneal Shunt Insertion    OB History    No data available     Home Medications    Prior to Admission medications   Medication Sig Start Date End Date Taking? Authorizing Provider  amLODipine (NORVASC) 10 MG tablet Take 1 tablet (10 mg total) by mouth daily. 11/07/13   [place], MD  aspirin EC 325 MG EC tablet Take 1 tablet (325 mg total) by mouth daily. 11/15/13   Belkys A Regalado, MD  atorvastatin (LIPITOR) 80 MG tablet Take 80 mg by  mouth daily.    Historical Provider, MD  calcitRIOL (ROCALTROL) 0.25 MCG capsule Take 1 capsule (0.25 mcg total) by mouth every Monday, Wednesday, and Friday. 05/18/13   Janece Canterbury, MD  camphor-menthol [facility]) lotion Apply 1 application topically as needed for itching. 05/18/13   Janece Canterbury, MD  cloNIDine (CATAPRES) 0.3 MG tablet Take 1 tablet (0.3 mg total) by mouth 3 (three) times daily. 11/07/13   [place], MD  feeding supplement, ENSURE COMPLETE, (ENSURE COMPLETE) LIQD  Take 237 mLs by mouth 2 (two) times daily between meals. 11/15/13   Belkys A Regalado, MD  furosemide (LASIX) 80 MG tablet Take 0.5 tablets (40 mg total) by mouth daily. 11/15/13   Belkys A Regalado, MD  isosorbide mononitrate (IMDUR) 30 MG 24 hr tablet Take 2 tablets (60 mg total) by mouth daily. 11/07/13   [place], MD  labetalol (NORMODYNE) 200 MG tablet Take 1.5 tablets (300 mg total) by mouth 3 (three) times daily. 11/07/13   [place], MD  levETIRAcetam (KEPPRA) 500 MG tablet Take 500 mg by mouth 2 (two) times daily.    Historical Provider, MD  minoxidil (LONITEN) 2.5 MG tablet Take 1 tablet (2.5 mg total) by mouth daily. 11/15/13   Belkys A Regalado, MD  nitroGLYCERIN (NITROSTAT) 0.4 MG SL tablet Place 1 tablet (0.4 mg total) under the tongue every 5 (five) minutes as needed for chest pain. 05/18/13   Janece Canterbury, MD  omeprazole (PRILOSEC) 20 MG capsule Take 20 mg by mouth daily.    Historical Provider, MD  sodium bicarbonate 650 MG tablet Take 1 tablet (650 mg total) by mouth 3 (three) times daily. 10/14/13   Barton Dubois, MD  tamsulosin (FLOMAX) 0.4 MG CAPS capsule Take 0.4 mg by mouth daily.    Historical Provider, MD    Family History Family History  Problem Relation Age of Onset  . Heart disease Mother   . Heart disease Father   . Coronary artery disease Other     Premature in 1st degree relatives    Social History Social History  Substance Use Topics  . Smoking status: Current Every Day Smoker    Packs/day: 0.20    Years: 35.00    Types: Cigarettes  . Smokeless tobacco: Never Used     Comment: 1/2 ppd   . Alcohol use No     Allergies   Ampicillin and Penicillins   Review of Systems Review of Systems  Constitutional: Negative for chills and fever.  Respiratory: Negative for cough and shortness of breath.   Cardiovascular: Negative for chest pain.  Gastrointestinal: Negative for abdominal pain, diarrhea, nausea and vomiting.  Genitourinary:  Negative for flank pain.  Neurological: Negative for tremors, syncope, weakness, numbness and headaches.  All other systems reviewed and are negative.  Physical Exam Updated Vital Signs BP 174/94 (BP Location: Right Arm)   Pulse 79   Temp 97.5 F (36.4 C) (Oral)   Resp 24   SpO2 99%   Physical Exam  Constitutional: No distress.  Female resting comfortably  HENT:  Head: Normocephalic.  Eyes: Conjunctivae are normal. Pupils are equal, round, and reactive to light.  Neck: Normal range of motion.  Cardiovascular: Normal rate.   Pulmonary/Chest: Effort normal. No respiratory distress. She has no wheezes.  Abdominal: Soft. She exhibits no distension and no mass. There is no tenderness. There is no guarding.  Musculoskeletal: Normal range of motion. She exhibits no edema.  Neurological: She is alert.  No facial droop  noted  5/5 bilateral intrinsic hand grip, bicep flexion, tricep extension.   5/5 bilateral plantarflexion, dorsiflexion and hip flexion  Skin: She is not diaphoretic.  Psychiatric:  Pleasantly confused at baseline  Nursing note and vitals reviewed.    ED Treatments / Results  Labs (all labs ordered are listed, but only abnormal results are displayed) Labs Reviewed - No data to display  EKG  EKG Interpretation None       Radiology No results found.  Procedures Procedures (including critical care time)  Medications Ordered in ED Medications - No data to display   Initial Impression / Assessment and Plan / ED Course  I have reviewed the triage vital signs and the nursing notes.  Pertinent labs & imaging results that were available during my care of the patient were reviewed by me and considered in my medical decision making (see chart for details).  Clinical Course   Ms. Winnick is a 55 y.o female sent here for HTN with history of the same. Reportedly, patient's BP was 240 mmHg, patient was given 0.1 mg of Clonidine as a PRN order then given 0.3 mg of  Clonidine as her normal daily medication.  She had no neuro deficit and is at baseline per EMS. Patient had no other concerns per facility.   Here, patient has HTN 174/94 that quickly resolved without intervention. Last BP 124/87 after approximately 2 hours of observation.  Patient had no signs of neuro deficits and no evidence of end organ damage.  She denies any additional complaints at this time. Given her benign exam, normal vitals and baseline mental status, I do not believe she requires any additional emergent intervention or workup at this time.   Patient discharged back home to SNF in good health.   Final Clinical Impressions(s) / ED Diagnoses   Final diagnoses:  Hypertension, unspecified type     Roberto Scales, MD 08/12/16 8937    Pattricia Boss, MD 08/13/16 1133

## 2016-08-12 NOTE — ED Triage Notes (Signed)
Pt BIB [facility] for evaluation of HTN today. Pt. With hx, facility ([facility]) reports pt. Is med compliant, yesterday BP 734 systolic. Reported BP 037 systolic today. Facility reports giving clonidine 0.1 mg at 1147 and clonidine 0.3 mg at 1200. Pt alert but confused at baseline.

## 2017-12-30 DIAGNOSIS — Z9189 Other specified personal risk factors, not elsewhere classified: Secondary | ICD-10-CM | POA: Insufficient documentation

## 2017-12-30 DIAGNOSIS — E7800 Pure hypercholesterolemia, unspecified: Secondary | ICD-10-CM | POA: Insufficient documentation

## 2017-12-30 DIAGNOSIS — R1084 Generalized abdominal pain: Secondary | ICD-10-CM | POA: Insufficient documentation

## 2017-12-30 DIAGNOSIS — R0602 Shortness of breath: Secondary | ICD-10-CM | POA: Insufficient documentation

## 2017-12-30 DIAGNOSIS — N2581 Secondary hyperparathyroidism of renal origin: Secondary | ICD-10-CM | POA: Insufficient documentation

## 2017-12-30 DIAGNOSIS — R509 Fever, unspecified: Secondary | ICD-10-CM | POA: Insufficient documentation

## 2017-12-30 DIAGNOSIS — N189 Chronic kidney disease, unspecified: Secondary | ICD-10-CM | POA: Insufficient documentation

## 2017-12-30 DIAGNOSIS — I509 Heart failure, unspecified: Secondary | ICD-10-CM | POA: Insufficient documentation

## 2018-01-02 DIAGNOSIS — I150 Renovascular hypertension: Secondary | ICD-10-CM | POA: Insufficient documentation

## 2018-02-04 DIAGNOSIS — D509 Iron deficiency anemia, unspecified: Secondary | ICD-10-CM | POA: Insufficient documentation

## 2018-02-16 ENCOUNTER — Emergency Department (HOSPITAL_COMMUNITY): Payer: Medicare Other

## 2018-02-16 ENCOUNTER — Encounter (HOSPITAL_COMMUNITY): Payer: Self-pay | Admitting: Emergency Medicine

## 2018-02-16 ENCOUNTER — Emergency Department (HOSPITAL_COMMUNITY)
Admission: EM | Admit: 2018-02-16 | Discharge: 2018-02-17 | Disposition: A | Discharge status: Home / Self Care | Payer: Medicare Other | Attending: Emergency Medicine | Admitting: Emergency Medicine

## 2018-02-16 DIAGNOSIS — F17210 Nicotine dependence, cigarettes, uncomplicated: Secondary | ICD-10-CM | POA: Diagnosis not present

## 2018-02-16 DIAGNOSIS — R0602 Shortness of breath: Secondary | ICD-10-CM

## 2018-02-16 DIAGNOSIS — Z95818 Presence of other cardiac implants and grafts: Secondary | ICD-10-CM | POA: Diagnosis not present

## 2018-02-16 DIAGNOSIS — Z79899 Other long term (current) drug therapy: Secondary | ICD-10-CM | POA: Diagnosis not present

## 2018-02-16 DIAGNOSIS — I2510 Atherosclerotic heart disease of native coronary artery without angina pectoris: Secondary | ICD-10-CM | POA: Diagnosis not present

## 2018-02-16 DIAGNOSIS — Z7982 Long term (current) use of aspirin: Secondary | ICD-10-CM | POA: Insufficient documentation

## 2018-02-16 DIAGNOSIS — I5032 Chronic diastolic (congestive) heart failure: Secondary | ICD-10-CM | POA: Diagnosis not present

## 2018-02-16 DIAGNOSIS — R000 Tachycardia, unspecified: Secondary | ICD-10-CM | POA: Diagnosis not present

## 2018-02-16 DIAGNOSIS — Z992 Dependence on renal dialysis: Secondary | ICD-10-CM | POA: Diagnosis not present

## 2018-02-16 DIAGNOSIS — R4182 Altered mental status, unspecified: Secondary | ICD-10-CM | POA: Diagnosis present

## 2018-02-16 DIAGNOSIS — N186 End stage renal disease: Secondary | ICD-10-CM | POA: Diagnosis not present

## 2018-02-16 DIAGNOSIS — I132 Hypertensive heart and chronic kidney disease with heart failure and with stage 5 chronic kidney disease, or end stage renal disease: Secondary | ICD-10-CM | POA: Insufficient documentation

## 2018-02-16 LAB — CBC
HEMATOCRIT: 36.6 % (ref 36.0–46.0)
HEMOGLOBIN: 12 g/dL (ref 12.0–15.0)
MCH: 28.7 pg (ref 26.0–34.0)
MCHC: 32.8 g/dL (ref 30.0–36.0)
MCV: 87.6 fL (ref 78.0–100.0)
Platelets: 171 10*3/uL (ref 150–400)
RBC: 4.18 MIL/uL (ref 3.87–5.11)
RDW: 13 % (ref 11.5–15.5)
WBC: 5.6 10*3/uL (ref 4.0–10.5)

## 2018-02-16 LAB — COMPREHENSIVE METABOLIC PANEL
ALBUMIN: 3.7 g/dL (ref 3.5–5.0)
ALT: 8 U/L — ABNORMAL LOW (ref 14–54)
ANION GAP: 15 (ref 5–15)
AST: 17 U/L (ref 15–41)
Alkaline Phosphatase: 85 U/L (ref 38–126)
BUN: 12 mg/dL (ref 6–20)
CO2: 30 mmol/L (ref 22–32)
Calcium: 9.2 mg/dL (ref 8.9–10.3)
Chloride: 96 mmol/L — ABNORMAL LOW (ref 101–111)
Creatinine, Ser: 2.98 mg/dL — ABNORMAL HIGH (ref 0.44–1.00)
GFR calc Af Amer: 19 mL/min — ABNORMAL LOW (ref >60)
GFR calc non Af Amer: 17 mL/min — ABNORMAL LOW (ref >60)
Glucose, Bld: 116 mg/dL — ABNORMAL HIGH (ref 65–99)
POTASSIUM: 2.8 mmol/L — AB (ref 3.5–5.1)
Sodium: 141 mmol/L (ref 135–145)
Total Bilirubin: 0.7 mg/dL (ref 0.3–1.2)
Total Protein: 7.8 g/dL (ref 6.5–8.1)

## 2018-02-16 LAB — CBG MONITORING, ED
Glucose-Capillary: 137 mg/dL — ABNORMAL HIGH (ref 65–99)
Glucose-Capillary: 96 mg/dL (ref 65–99)

## 2018-02-16 MED ORDER — LORAZEPAM 1 MG PO TABS
1.0000 mg | ORAL_TABLET | Freq: Once | ORAL | Status: AC
  Administered 2018-02-16: 1 mg via ORAL
  Filled 2018-02-16: qty 1

## 2018-02-16 MED ORDER — POTASSIUM CHLORIDE CRYS ER 20 MEQ PO TBCR
20.0000 meq | EXTENDED_RELEASE_TABLET | Freq: Once | ORAL | Status: AC
  Administered 2018-02-17: 20 meq via ORAL
  Filled 2018-02-16: qty 1

## 2018-02-16 NOTE — ED Provider Notes (Signed)
[place] DEPT Provider Note   CSN: [id number] Arrival date & time: 02/16/18  1850     History   Chief Complaint Chief Complaint  Patient presents with  . Altered Mental Status  . Shortness of Breath    HPI [name] is a 57 y.o. female.  She is dialysis dependent end-stage renal disease also is today.  When being picked up from her daughter from dialysis her daughter noticed that she seemed less herself, seemed to be having more trouble breathing.  Patient herself admits she feels more short of breath.  She states she has pain and then she is denies having pain anywhere.  She also said she felt hot all over.  There was nothing different about today's dialysis run.  Patient is newly returned from the nursing home to home after being there for about 3 years.  Her daughter is been very much involved and states that she usually after dialysis has no problems and is usually hungry and very interactive.  Patient herself denies any chest pain abdominal pain vomiting diarrhea.  She denies any change in medicines and there is been no reported fevers.  The history is provided by the patient and a relative.  Altered Mental Status   This is a new problem. The current episode started 1 to 2 hours ago. The problem has been gradually improving. Associated symptoms include confusion and weakness. Pertinent negatives include no seizures. Her past medical history is significant for CVA.  Shortness of Breath  This is a new problem. The current episode started 1 to 2 hours ago. The problem has not changed since onset.Pertinent negatives include no fever, no headaches, no coryza, no rhinorrhea, no sore throat, no ear pain, no cough, no sputum production, no hemoptysis, no chest pain, no syncope, no vomiting, no abdominal pain and no rash. It is unknown what precipitated the problem. She has tried nothing for the symptoms. The treatment provided no relief.    Past Medical  History:  Diagnosis Date  . Aneurysm ([place])   . CKD (chronic kidney disease) stage 3, GFR 30-59 ml/min (HCC)    Most recent creatinine 1.4 in 3/10  . Coronary atherosclerosis of native coronary artery    STEMI Feb 2009 - 70% distal LAD lesion c/w plaque rupture, DES distal RCA  . Diastolic heart failure    LVEF 60-65% with grade 2 diastolic dysfunction - July 2014  . HTN (hypertension)    Resistant  . Hypercholesterolemia   . Subarachnoid hemorrhage ([place])    03/2012  . Tobacco abuse    Resumed smoking half a pack a day. She was a smoker in the past and states she was abl eto stop in the past using nicotine patches    Patient Active Problem List   Diagnosis Date Noted  . CVA (cerebral infarction) 11/11/2013  . Altered mental status 11/08/2013  . Acute encephalopathy 11/08/2013  . HTN (hypertension) 11/08/2013  . Hypertensive renal disease 11/06/2013  . Protein-calorie malnutrition, severe ([place]) 11/04/2013  . Seizure disorder ([place]) 10/22/2013  . Hypertensive urgency 10/20/2013  . Hypertensive heart disease 05/07/2013  . Acute-on-chronic renal failure ([place]) 05/07/2013  . HTN (hypertension), malignant 05/06/2013  . CAD (coronary artery disease) 05/06/2013  . CKD (chronic kidney disease), stage IV ([place]) 05/06/2013  . Chronic diastolic heart failure ([place])   . Tobacco abuse 09/25/2012  . H/O noncompliance with medical treatment, presenting hazards to health   . Renal artery stenosis ([place]) 04/05/2012  . History  of subarachnoid hemorrhage 03/03/2012  . Acute on chronic diastolic heart failure ([place]) 09/19/2008    Past Surgical History:  Procedure Laterality Date  . ABDOMINAL HYSTERECTOMY    . BASCILIC VEIN TRANSPOSITION Right 05/13/2013   Procedure: [place];  Surgeon: Rosetta Posner, MD;  Location: [facility];  Service: Vascular;  Laterality: Right;  . BRAIN SURGERY     2   . LOOP RECORDER IMPLANT N/A 11/14/2013   Procedure: LOOP RECORDER IMPLANT;  Surgeon: Evans Lance, MD;  Location: [facility] CATH LAB;  Service: Cardiovascular;  Laterality: N/A;  . TEE WITHOUT CARDIOVERSION N/A 11/14/2013   Procedure: TRANSESOPHAGEAL ECHOCARDIOGRAM (TEE);  Surgeon: Candee Furbish, MD;  Location: [facility] ENDOSCOPY;  Service: Cardiovascular;  Laterality: N/A;  . VENTRICULOPERITONEAL SHUNT  03/27/2012   Procedure: SHUNT INSERTION VENTRICULAR-PERITONEAL;  Surgeon: Winfield Cunas, MD;  Location: [place] NEURO ORS;  Service: Neurosurgery;  Laterality: N/A;  Ventricular-Peritoneal Shunt Insertion     OB History   None      Home Medications    Prior to Admission medications   Medication Sig Start Date End Date Taking? Authorizing Provider  amLODipine (NORVASC) 10 MG tablet Take 1 tablet (10 mg total) by mouth daily. 11/07/13   Caren Griffins, MD  aspirin EC 325 MG EC tablet Take 1 tablet (325 mg total) by mouth daily. 11/15/13   Regalado, Belkys A, MD  atorvastatin (LIPITOR) 80 MG tablet Take 80 mg by mouth daily.    [provider]  calcitRIOL (ROCALTROL) 0.25 MCG capsule Take 1 capsule (0.25 mcg total) by mouth every Monday, Wednesday, and Friday. 05/18/13   Janece Canterbury, MD  camphor-menthol [facility]) lotion Apply 1 application topically as needed for itching. 05/18/13   Janece Canterbury, MD  cloNIDine (CATAPRES) 0.3 MG tablet Take 1 tablet (0.3 mg total) by mouth 3 (three) times daily. 11/07/13   Caren Griffins, MD  feeding supplement, ENSURE COMPLETE, (ENSURE COMPLETE) LIQD Take 237 mLs by mouth 2 (two) times daily between meals. 11/15/13   Regalado, Belkys A, MD  furosemide (LASIX) 80 MG tablet Take 0.5 tablets (40 mg total) by mouth daily. 11/15/13   Regalado, Belkys A, MD  isosorbide mononitrate (IMDUR) 30 MG 24 hr tablet Take 2 tablets (60 mg total) by mouth daily. 11/07/13   Caren Griffins, MD  labetalol (NORMODYNE) 200 MG tablet Take 1.5 tablets (300 mg total) by mouth 3 (three) times daily. 11/07/13   Caren Griffins, MD  levETIRAcetam (KEPPRA) 500 MG tablet Take 500  mg by mouth 2 (two) times daily.    [provider]  minoxidil (LONITEN) 2.5 MG tablet Take 1 tablet (2.5 mg total) by mouth daily. 11/15/13   Regalado, Belkys A, MD  nitroGLYCERIN (NITROSTAT) 0.4 MG SL tablet Place 1 tablet (0.4 mg total) under the tongue every 5 (five) minutes as needed for chest pain. 05/18/13   Janece Canterbury, MD  omeprazole (PRILOSEC) 20 MG capsule Take 20 mg by mouth daily.    [provider]  sodium bicarbonate 650 MG tablet Take 1 tablet (650 mg total) by mouth 3 (three) times daily. 10/14/13   Barton Dubois, MD  tamsulosin (FLOMAX) 0.4 MG CAPS capsule Take 0.4 mg by mouth daily.    [provider]    Family History Family History  Problem Relation Age of Onset  . Heart disease Mother   . Heart disease Father   . Coronary artery disease Other        Premature in  1st degree relatives    Social History Social History   Tobacco Use  . Smoking status: Current Every Day Smoker    Packs/day: 0.20    Years: 35.00    Pack years: 7.00    Types: Cigarettes  . Smokeless tobacco: Never Used  . Tobacco comment: 1/2 ppd   Substance Use Topics  . Alcohol use: No  . Drug use: No     Allergies   Ampicillin and Penicillins   Review of Systems Review of Systems  Constitutional: Negative for chills and fever.  HENT: Negative for ear pain, rhinorrhea and sore throat.   Eyes: Negative for pain and visual disturbance.  Respiratory: Positive for shortness of breath. Negative for cough, hemoptysis and sputum production.   Cardiovascular: Negative for chest pain, palpitations and syncope.  Gastrointestinal: Negative for abdominal pain and vomiting.  Genitourinary: Negative for dysuria and hematuria.  Musculoskeletal: Negative for arthralgias and back pain.  Skin: Negative for color change and rash.  Neurological: Positive for weakness. Negative for seizures, syncope and headaches.  Psychiatric/Behavioral: Positive for confusion.  All other  systems reviewed and are negative.    Physical Exam Updated Vital Signs BP 124/73 (BP Location: Left Arm)   Pulse (!) 109   Temp 97.7 F (36.5 C) (Oral)   Resp (!) 30   Ht 5\' 1"  (1.549 m)   SpO2 100%   BMI 21.54 kg/m   Physical Exam  Constitutional: She appears well-developed and well-nourished.  Non-toxic appearance. She does not appear ill.  HENT:  Head: Normocephalic and atraumatic.  Mouth/Throat: Oropharynx is clear and moist. No oropharyngeal exudate or posterior oropharyngeal edema.  Eyes: Pupils are equal, round, and reactive to light. EOM are normal.  Neck: Normal range of motion. Neck supple.  Cardiovascular: Normal heart sounds. Tachycardia present.  Pulmonary/Chest: Effort normal and breath sounds normal. She exhibits no tenderness, no edema and no swelling.  Abdominal: Soft. She exhibits no mass. There is no tenderness. There is no rebound and no guarding.  Musculoskeletal: Normal range of motion.       Right lower leg: Normal. She exhibits no tenderness.       Left lower leg: Normal. She exhibits no tenderness.  Neurological: She is alert. She has normal strength. She is disoriented (time). No cranial nerve deficit or sensory deficit. GCS eye subscore is 4. GCS verbal subscore is 5. GCS motor subscore is 6.  Skin: Skin is warm and dry. Capillary refill takes less than 2 seconds. No rash noted. She is not diaphoretic.     ED Treatments / Results  Labs (all labs ordered are listed, but only abnormal results are displayed) Labs Reviewed  COMPREHENSIVE METABOLIC PANEL - Abnormal; Notable for the following components:      Result Value   Potassium 2.8 (*)    Chloride 96 (*)    Glucose, Bld 116 (*)    Creatinine, Ser 2.98 (*)    ALT 8 (*)    GFR calc non Af Amer 17 (*)    GFR calc Af Amer 19 (*)    All other components within normal limits  CBG MONITORING, ED - Abnormal; Notable for the following components:   Glucose-Capillary 137 (*)    All other components  within normal limits  CBC  CBG MONITORING, ED    EKG EKG Interpretation  Date/Time:  Tuesday February 16 2018 18:52:07 EDT Ventricular Rate:  111 PR Interval:    QRS Duration: 88 QT Interval:  368 QTC Calculation:  501 R Axis:   82 Text Interpretation:  Sinus tachycardia Probable left atrial enlargement LVH with secondary repolarization abnormality Anterior infarct, old improved ischemic changes from prior 1/15 Confirmed by Aletta Edouard [phone]) on 02/16/2018 7:45:57 PM   Radiology Dg Chest 2 View  Result Date: 02/16/2018 CLINICAL DATA:  Shortness of breath.  Altered mental status. EXAM: CHEST - 2 VIEW COMPARISON:  11/08/2013. FINDINGS: Mildly enlarged cardiac silhouette with an interval decrease in size. A right ventriculoperitoneal shunt catheter is again demonstrated. Clear lungs with stable mild anterior tenting of the left hemidiaphragm. No pleural fluid. Normal vascularity. Proximal right arm surgical clips. Mild scoliosis. IMPRESSION: No acute abnormality. Electronically Signed   By: Claudie Revering M.D.   On: 02/16/2018 19:38   Ct Head Wo Contrast  Result Date: 02/16/2018 CLINICAL DATA:  Altered mental status EXAM: CT HEAD WITHOUT CONTRAST TECHNIQUE: Contiguous axial images were obtained from the base of the skull through the vertex without intravenous contrast. COMPARISON:  11/11/2013, 11/10/2013, 11/08/2013 FINDINGS: Brain: Chronic left frontal parietal infarct with interval ex vacuo dilatation of left lateral ventricle. Encephalomalacia in the right frontal lobe adjacent to the ventricular catheter. Tip of the ventricular catheter similar compared to prior, near the base of the third ventricle. No hemorrhage or mass. Old lacunar infarcts in the thalamus and left basal ganglia. Old left cerebellar infarct. Moderate hypodensity in the white matter consistent with small vessel ischemic change. Mild atrophy, slight increased compared to prior. Lateral and third ventricles slightly increased  in size. Vascular: Prior aneurysm coiling.  Carotid vascular calcifications. Skull: Prior right frontal temp oral craniotomy.  No fracture Sinuses/Orbits: Small mucous retention cyst in the right ethmoid sinus. Other: None IMPRESSION: 1. Slight increase in size of the lateral and third ventricles, this is of uncertain acuity given that the most recent prior available for comparison is from 2015. Some of the left lateral ventricular enlargement is felt related to ex vacuo dilatation from left-sided infarct. 2. Chronic left frontal parietal infarct with right frontal lobe encephalomalacia and old left cerebellar, thalamic and basal ganglial infarcts. 3. Atrophy with small vessel ischemic changes of the white matter Electronically Signed   By: Donavan Foil M.D.   On: 02/16/2018 20:54    Procedures Procedures (including critical care time)  Medications Ordered in ED Medications - No data to display   Initial Impression / Assessment and Plan / ED Course  I have reviewed the triage vital signs and the nursing notes.  Pertinent labs & imaging results that were available during my care of the patient were reviewed by me and considered in my medical decision making (see chart for details).  Clinical Course as of Feb 18 1732  Tue Feb 16, 2018  2219 Reevaluated Ms. Willers.  She states she is feeling much better and feels back to baseline.  I asked her about what she thought was the cause of her symptoms and she said that she was new to dialysis and she thinks that it is likely related to that.  She states she is only been on dialysis for a month and although she has had no problems this might have been the first.  Her labs are to starting to come back now I told her we will reevaluate this after we get the full set of labs back.   [MB]    Clinical Course User Index [MB] Hayden Rasmussen, MD    Final Clinical Impressions(s) / ED Diagnoses   Final diagnoses:  Shortness of  breath    ED Discharge  Orders    None       Hayden Rasmussen, MD 02/17/18 1735

## 2018-02-16 NOTE — ED Notes (Signed)
Ms Happel is ve

## 2018-02-16 NOTE — ED Notes (Signed)
[name] is displaying high levels of anxiety related to IV start and lab draw AEB: pulling away tremors and screams. EDP notified and order for anti-anxiety medication requested

## 2018-02-16 NOTE — Discharge Instructions (Addendum)
Your evaluated in the emergency department for feeling short of breath and hot and unwell after dialysis today.  Your test did not show an obvious cause of your symptoms.  You improved over the time that you were in the department and felt well enough to go home.  This may be related to your dialysis and you should mention this to your dialysis nurse the next time you go.  Please return if you have any worsening symptoms.

## 2018-02-16 NOTE — ED Triage Notes (Signed)
Pt finished dialysis around 5p today. Family states since then hasnt been herself. C/o hot so daughter put fan on her but still wasn't "acting her normal".

## 2018-03-16 DIAGNOSIS — R6883 Chills (without fever): Secondary | ICD-10-CM | POA: Insufficient documentation

## 2018-03-20 ENCOUNTER — Observation Stay (HOSPITAL_COMMUNITY)
Admission: EM | Admit: 2018-03-20 | Discharge: 2018-03-24 | Disposition: A | Discharge status: Home / Self Care | Payer: 59 | Attending: Internal Medicine | Admitting: Internal Medicine

## 2018-03-20 ENCOUNTER — Emergency Department (HOSPITAL_COMMUNITY): Payer: 59

## 2018-03-20 ENCOUNTER — Encounter (HOSPITAL_COMMUNITY): Payer: Self-pay

## 2018-03-20 ENCOUNTER — Other Ambulatory Visit: Payer: Self-pay

## 2018-03-20 DIAGNOSIS — I10 Essential (primary) hypertension: Secondary | ICD-10-CM | POA: Diagnosis present

## 2018-03-20 DIAGNOSIS — I132 Hypertensive heart and chronic kidney disease with heart failure and with stage 5 chronic kidney disease, or end stage renal disease: Secondary | ICD-10-CM | POA: Insufficient documentation

## 2018-03-20 DIAGNOSIS — R569 Unspecified convulsions: Secondary | ICD-10-CM | POA: Diagnosis not present

## 2018-03-20 DIAGNOSIS — G40909 Epilepsy, unspecified, not intractable, without status epilepticus: Secondary | ICD-10-CM | POA: Diagnosis not present

## 2018-03-20 DIAGNOSIS — I4891 Unspecified atrial fibrillation: Secondary | ICD-10-CM | POA: Diagnosis present

## 2018-03-20 DIAGNOSIS — I252 Old myocardial infarction: Secondary | ICD-10-CM | POA: Insufficient documentation

## 2018-03-20 DIAGNOSIS — Z87891 Personal history of nicotine dependence: Secondary | ICD-10-CM | POA: Insufficient documentation

## 2018-03-20 DIAGNOSIS — R000 Tachycardia, unspecified: Secondary | ICD-10-CM | POA: Diagnosis present

## 2018-03-20 DIAGNOSIS — Z8679 Personal history of other diseases of the circulatory system: Secondary | ICD-10-CM

## 2018-03-20 DIAGNOSIS — I5032 Chronic diastolic (congestive) heart failure: Secondary | ICD-10-CM | POA: Insufficient documentation

## 2018-03-20 DIAGNOSIS — Z8673 Personal history of transient ischemic attack (TIA), and cerebral infarction without residual deficits: Secondary | ICD-10-CM | POA: Diagnosis not present

## 2018-03-20 DIAGNOSIS — Z515 Encounter for palliative care: Secondary | ICD-10-CM

## 2018-03-20 DIAGNOSIS — N186 End stage renal disease: Secondary | ICD-10-CM | POA: Diagnosis not present

## 2018-03-20 DIAGNOSIS — R531 Weakness: Secondary | ICD-10-CM | POA: Diagnosis not present

## 2018-03-20 DIAGNOSIS — Z79899 Other long term (current) drug therapy: Secondary | ICD-10-CM | POA: Insufficient documentation

## 2018-03-20 DIAGNOSIS — Z7189 Other specified counseling: Secondary | ICD-10-CM

## 2018-03-20 DIAGNOSIS — I2510 Atherosclerotic heart disease of native coronary artery without angina pectoris: Secondary | ICD-10-CM | POA: Insufficient documentation

## 2018-03-20 DIAGNOSIS — I480 Paroxysmal atrial fibrillation: Principal | ICD-10-CM | POA: Insufficient documentation

## 2018-03-20 DIAGNOSIS — Z992 Dependence on renal dialysis: Secondary | ICD-10-CM | POA: Insufficient documentation

## 2018-03-20 HISTORY — DX: Dependence on renal dialysis: Z99.2

## 2018-03-20 HISTORY — DX: Cerebral aneurysm, nonruptured: I67.1

## 2018-03-20 HISTORY — DX: End stage renal disease: N18.6

## 2018-03-20 LAB — CBC WITH DIFFERENTIAL/PLATELET
ABS IMMATURE GRANULOCYTES: 0 10*3/uL (ref 0.0–0.1)
BASOS PCT: 1 %
Basophils Absolute: 0 10*3/uL (ref 0.0–0.1)
Eosinophils Absolute: 0 10*3/uL (ref 0.0–0.7)
Eosinophils Relative: 0 %
HCT: 38.9 % (ref 36.0–46.0)
HEMOGLOBIN: 12.6 g/dL (ref 12.0–15.0)
IMMATURE GRANULOCYTES: 0 %
LYMPHS ABS: 2 10*3/uL (ref 0.7–4.0)
LYMPHS PCT: 28 %
MCH: 28.8 pg (ref 26.0–34.0)
MCHC: 32.4 g/dL (ref 30.0–36.0)
MCV: 89 fL (ref 78.0–100.0)
Monocytes Absolute: 0.4 10*3/uL (ref 0.1–1.0)
Monocytes Relative: 6 %
NEUTROS ABS: 4.7 10*3/uL (ref 1.7–7.7)
NEUTROS PCT: 65 %
PLATELETS: 221 10*3/uL (ref 150–400)
RBC: 4.37 MIL/uL (ref 3.87–5.11)
RDW: 12.9 % (ref 11.5–15.5)
WBC: 7.2 10*3/uL (ref 4.0–10.5)

## 2018-03-20 LAB — COMPREHENSIVE METABOLIC PANEL
ALK PHOS: 93 U/L (ref 38–126)
ALT: 10 U/L — AB (ref 14–54)
AST: 15 U/L (ref 15–41)
Albumin: 3.4 g/dL — ABNORMAL LOW (ref 3.5–5.0)
Anion gap: 14 (ref 5–15)
BUN: 11 mg/dL (ref 6–20)
CHLORIDE: 95 mmol/L — AB (ref 101–111)
CO2: 30 mmol/L (ref 22–32)
CREATININE: 4.9 mg/dL — AB (ref 0.44–1.00)
Calcium: 8.7 mg/dL — ABNORMAL LOW (ref 8.9–10.3)
GFR calc Af Amer: 10 mL/min — ABNORMAL LOW (ref >60)
GFR, EST NON AFRICAN AMERICAN: 9 mL/min — AB (ref >60)
Glucose, Bld: 83 mg/dL (ref 65–99)
Potassium: 3 mmol/L — ABNORMAL LOW (ref 3.5–5.1)
Sodium: 139 mmol/L (ref 135–145)
Total Bilirubin: 0.7 mg/dL (ref 0.3–1.2)
Total Protein: 6.8 g/dL (ref 6.5–8.1)

## 2018-03-20 LAB — I-STAT TROPONIN, ED: TROPONIN I, POC: 0.48 ng/mL — AB (ref 0.00–0.08)

## 2018-03-20 LAB — BRAIN NATRIURETIC PEPTIDE: B Natriuretic Peptide: 219.9 pg/mL — ABNORMAL HIGH (ref 0.0–100.0)

## 2018-03-20 MED ORDER — RENA-VITE PO TABS
1.0000 | ORAL_TABLET | Freq: Every day | ORAL | Status: DC
  Administered 2018-03-21 – 2018-03-24 (×4): 1 via ORAL
  Filled 2018-03-20 (×5): qty 1

## 2018-03-20 MED ORDER — ACETAMINOPHEN 325 MG PO TABS: 650.0000 mg | ORAL_TABLET | ORAL | Status: DC | PRN

## 2018-03-20 MED ORDER — LIDOCAINE-PRILOCAINE 2.5-2.5 % EX CREA: 1.0000 "application " | TOPICAL_CREAM | Freq: Three times a day (TID) | CUTANEOUS | Status: DC | PRN

## 2018-03-20 MED ORDER — LACTULOSE 10 GM/15ML PO SOLN: 10.0000 g | Freq: Every day | ORAL | Status: DC | PRN

## 2018-03-20 MED ORDER — ATORVASTATIN CALCIUM 40 MG PO TABS
40.0000 mg | ORAL_TABLET | Freq: Every day | ORAL | Status: DC
  Administered 2018-03-21 – 2018-03-24 (×4): 40 mg via ORAL
  Filled 2018-03-20 (×4): qty 1

## 2018-03-20 MED ORDER — POTASSIUM CHLORIDE 10 MEQ/100ML IV SOLN
10.0000 meq | Freq: Once | INTRAVENOUS | Status: AC
  Administered 2018-03-20: 10 meq via INTRAVENOUS
  Filled 2018-03-20: qty 100

## 2018-03-20 MED ORDER — LABETALOL HCL 200 MG PO TABS
300.0000 mg | ORAL_TABLET | Freq: Two times a day (BID) | ORAL | Status: DC
  Administered 2018-03-20: 300 mg via ORAL
  Filled 2018-03-20: qty 1
  Filled 2018-03-20: qty 2

## 2018-03-20 MED ORDER — ISOSORBIDE MONONITRATE ER 30 MG PO TB24
30.0000 mg | ORAL_TABLET | Freq: Every day | ORAL | Status: DC
  Administered 2018-03-20: 30 mg via ORAL
  Filled 2018-03-20: qty 1

## 2018-03-20 MED ORDER — LEVETIRACETAM 500 MG PO TABS
500.0000 mg | ORAL_TABLET | Freq: Two times a day (BID) | ORAL | Status: DC
  Administered 2018-03-20 – 2018-03-24 (×8): 500 mg via ORAL
  Filled 2018-03-20 (×8): qty 1

## 2018-03-20 MED ORDER — PANTOPRAZOLE SODIUM 40 MG PO TBEC
40.0000 mg | DELAYED_RELEASE_TABLET | Freq: Every day | ORAL | Status: DC
  Administered 2018-03-21 – 2018-03-24 (×4): 40 mg via ORAL
  Filled 2018-03-20 (×4): qty 1

## 2018-03-20 MED ORDER — AMLODIPINE BESYLATE 10 MG PO TABS
10.0000 mg | ORAL_TABLET | Freq: Every day | ORAL | Status: DC
  Administered 2018-03-20: 10 mg via ORAL
  Filled 2018-03-20: qty 2

## 2018-03-20 MED ORDER — ONDANSETRON HCL 4 MG/2ML IJ SOLN
4.0000 mg | Freq: Four times a day (QID) | INTRAMUSCULAR | Status: DC | PRN
  Administered 2018-03-23: 4 mg via INTRAVENOUS

## 2018-03-20 MED ORDER — AMLODIPINE BESYLATE 5 MG PO TABS: 10.0000 mg | ORAL_TABLET | Freq: Every day | ORAL | Status: DC

## 2018-03-20 MED ORDER — TAMSULOSIN HCL 0.4 MG PO CAPS
0.4000 mg | ORAL_CAPSULE | Freq: Every day | ORAL | Status: DC
  Administered 2018-03-21 – 2018-03-24 (×4): 0.4 mg via ORAL
  Filled 2018-03-20 (×4): qty 1

## 2018-03-20 NOTE — ED Notes (Signed)
Attempted report x1. Left name and number with secretary for callback once charge nurse places pt.

## 2018-03-20 NOTE — ED Triage Notes (Signed)
Pt arrives [facility] from dialysis where she waas 1.5 hours into 4 hour run and began hypotension and tachycardia. Pt alert and oriented. Initial bp on scene 88/p. Denies pain but some nausea. No vomiting.

## 2018-03-20 NOTE — Progress Notes (Signed)
Curbside phone consultation documentation  Called by Dr. 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  Question is if anticoagulation can be initiated given the history of subarachnoid and aneurysms. My answer to that is that if she had a solitary aneurysm that had caused the [facility], and is now secured, anti-coagulation could strongly be considered given h/o embolic ischemic strokes, but in the presence of other aneurysms that are unsecured, I would be hesitant to start anticoagulation on her for the risk of bleed.  Best course of action, that I recommended on the curbside consult to Dr. Alcario Drought is to obtain a CT angiogram of the head and neck to better evaluate the current status of her aneurysms.  He would like to do the CT angiogram of the head and neck closer to her next dialysis which would be Monday.  Once the CT angiogram is available, please call back neurology and then we will be able to provide more meaningful input in her care.  Relayed the plan to Dr. Alcario Drought over the phone.  -- Amie Portland, MD [facility] Pager: [phone] If 7pm to 7am, please call on call as listed on AMION.

## 2018-03-20 NOTE — ED Notes (Signed)
Granddaughter number [phone]

## 2018-03-20 NOTE — ED Notes (Signed)
Pt jumped and jerked up when I tried to perform blood draw.  Reject x1

## 2018-03-20 NOTE — ED Provider Notes (Signed)
[facility] EMERGENCY DEPARTMENT Provider Note   CSN: [id number] Arrival date & time: 03/20/18  1454     History   Chief Complaint Chief Complaint  Patient presents with  . Tachycardia    HPI [name] is a 57 y.o. female.  HPI  Patient is a 57 year old female with a past medical history of CKD on dialysis, however patient is unsure as to how many times a week she is supposed to go to dialysis.  Patient with history of noncompliance as well as CAD, hypertension, CHF.  Patient was at dialysis today when she became tachycardic and hypotensive.  Patient denies any recent illness, fevers, chills, nausea vomiting or chest pain.  Patient denies shortness of breath.   Past Medical History:  Diagnosis Date  . Anterior cerebral artery aneurysm    1996 AT [facility]  . Coronary atherosclerosis of native coronary artery    STEMI Feb 2009 - 70% distal LAD lesion c/w plaque rupture, DES distal RCA  . Diastolic heart failure    LVEF 60-65% with grade 2 diastolic dysfunction - July 2014  . ESRD (end stage renal disease) on dialysis ([place])   . HTN (hypertension)    Resistant  . Hypercholesterolemia   . Subarachnoid hemorrhage ([place])    03/2012  . Tobacco abuse    Resumed smoking half a pack a day. She was a smoker in the past and states she was abl eto stop in the past using nicotine patches    Patient Active Problem List   Diagnosis Date Noted  . Atrial fibrillation with RVR ([place]) 03/20/2018  . History of embolic stroke 18/56/3149  . Altered mental status 11/08/2013  . Acute encephalopathy 11/08/2013  . HTN (hypertension) 11/08/2013  . Hypertensive renal disease 11/06/2013  . Protein-calorie malnutrition, severe ([place]) 11/04/2013  . Seizure disorder ([place]) 10/22/2013  . Hypertensive urgency 10/20/2013  . Hypertensive heart disease 05/07/2013  . Acute-on-chronic renal failure ([place]) 05/07/2013  . HTN (hypertension), malignant 05/06/2013  . CAD (coronary artery disease)  05/06/2013  . ESRD (end stage renal disease) ([place]) 05/06/2013  . Chronic diastolic heart failure ([place])   . Tobacco abuse 09/25/2012  . H/O noncompliance with medical treatment, presenting hazards to health   . Renal artery stenosis ([place]) 04/05/2012  . History of subarachnoid hemorrhage 03/03/2012  . Acute on chronic diastolic heart failure ([place]) 09/19/2008    Past Surgical History:  Procedure Laterality Date  . ABDOMINAL HYSTERECTOMY    . BASCILIC VEIN TRANSPOSITION Right 05/13/2013   Procedure: [place];  Surgeon: Rosetta Posner, MD;  Location: [place];  Service: Vascular;  Laterality: Right;  . BRAIN SURGERY     2   . LOOP RECORDER IMPLANT N/A 11/14/2013   Procedure: LOOP RECORDER IMPLANT;  Surgeon: Evans Lance, MD;  Location: [facility] CATH LAB;  Service: Cardiovascular;  Laterality: N/A;  . TEE WITHOUT CARDIOVERSION N/A 11/14/2013   Procedure: TRANSESOPHAGEAL ECHOCARDIOGRAM (TEE);  Surgeon: Candee Furbish, MD;  Location: [facility] ENDOSCOPY;  Service: Cardiovascular;  Laterality: N/A;  . VENTRICULOPERITONEAL SHUNT  03/27/2012   Procedure: SHUNT INSERTION VENTRICULAR-PERITONEAL;  Surgeon: Winfield Cunas, MD;  Location: [facility] NEURO ORS;  Service: Neurosurgery;  Laterality: N/A;  Ventricular-Peritoneal Shunt Insertion     OB History   None      Home Medications    Prior to Admission medications   Medication Sig Start Date End Date Taking? Authorizing Provider  amLODipine (NORVASC) 10 MG tablet Take 1 tablet (10 mg total) by  mouth daily. Patient taking differently: Take 10 mg by mouth at bedtime.  11/07/13  Yes Caren Griffins, MD  atorvastatin (LIPITOR) 40 MG tablet Take 40 mg by mouth daily.   Yes [provider]  isosorbide mononitrate (IMDUR) 30 MG 24 hr tablet Take 2 tablets (60 mg total) by mouth daily. Patient taking differently: Take 30 mg by mouth daily.  11/07/13  Yes Gherghe, Vella Redhead, MD  labetalol (NORMODYNE) 200 MG tablet Take 1.5 tablets (300 mg total) by  mouth 3 (three) times daily. Patient taking differently: Take 300 mg by mouth 2 (two) times daily.  11/07/13  Yes Caren Griffins, MD  lactulose (CHRONULAC) 10 GM/15ML solution Take 10 g by mouth daily as needed for moderate constipation.    Yes [provider]  levETIRAcetam (KEPPRA) 500 MG tablet Take 500 mg by mouth 2 (two) times daily.   Yes [provider]  lidocaine-prilocaine (EMLA) cream Apply 1 application topically 3 (three) times daily. Apply prior to [facility] 02/25/18  Yes [provider]  multivitamin (RENA-VIT) TABS tablet Take 1 tablet by mouth daily.   Yes [provider]  nitroGLYCERIN (NITROSTAT) 0.4 MG SL tablet Place 1 tablet (0.4 mg total) under the tongue every 5 (five) minutes as needed for chest pain. 05/18/13  Yes Short, Noah Delaine, MD  omeprazole (PRILOSEC) 20 MG capsule Take 20 mg by mouth daily.   Yes [provider]  tamsulosin (FLOMAX) 0.4 MG CAPS capsule Take 0.4 mg by mouth daily.   Yes [provider]    Family History Family History  Problem Relation Age of Onset  . Heart disease Mother   . Heart disease Father   . Coronary artery disease Other        Premature in 1st degree relatives    Social History Social History   Tobacco Use  . Smoking status: Former Smoker    Packs/day: 0.20    Years: 35.00    Pack years: 7.00    Types: Cigarettes    Last attempt to quit: 03/20/2017    Years since quitting: 1.0  . Smokeless tobacco: Never Used  . Tobacco comment: 1/2 ppd   Substance Use Topics  . Alcohol use: No  . Drug use: No     Allergies   Ampicillin and Penicillins   Review of Systems Review of Systems  Respiratory: Negative for shortness of breath.   Cardiovascular: Negative for chest pain.  Gastrointestinal: Negative for abdominal pain, nausea and vomiting.  Neurological: Positive for light-headedness.  All other systems reviewed and are negative.    Physical Exam Updated Vital  Signs BP 109/61 (BP Location: Left Arm)   Pulse 70   Temp 97.7 F (36.5 C) (Oral)   Resp 16   Ht 5\' 1"  (1.549 m)   Wt 47.4 kg (104 lb 9.6 oz)   SpO2 99%   BMI 19.76 kg/m   Physical Exam  Constitutional: She is oriented to person, place, and time. She appears well-developed and well-nourished. No distress.  HENT:  Head: Normocephalic and atraumatic.  Eyes: Conjunctivae are normal.  Neck: Neck supple.  Cardiovascular:  No murmur heard. Atrial fibrillation with rapid ventricular response  Pulmonary/Chest: Effort normal and breath sounds normal. No respiratory distress.  Abdominal: Soft. There is no tenderness.  Musculoskeletal: She exhibits no edema.  Neurological: She is alert and oriented to person, place, and time.  Skin: Skin is warm and dry.  Psychiatric: She has a normal mood and affect.  Nursing  note and vitals reviewed.    ED Treatments / Results  Labs (all labs ordered are listed, but only abnormal results are displayed) Labs Reviewed  COMPREHENSIVE METABOLIC PANEL - Abnormal; Notable for the following components:      Result Value   Potassium 3.0 (*)    Chloride 95 (*)    Creatinine, Ser 4.90 (*)    Calcium 8.7 (*)    Albumin 3.4 (*)    ALT 10 (*)    GFR calc non Af Amer 9 (*)    GFR calc Af Amer 10 (*)    All other components within normal limits  BRAIN NATRIURETIC PEPTIDE - Abnormal; Notable for the following components:   B Natriuretic Peptide 219.9 (*)    All other components within normal limits  TROPONIN I - Abnormal; Notable for the following components:   Troponin I 2.42 (*)    All other components within normal limits  TROPONIN I - Abnormal; Notable for the following components:   Troponin I 2.53 (*)    All other components within normal limits  TROPONIN I - Abnormal; Notable for the following components:   Troponin I 2.00 (*)    All other components within normal limits  BASIC METABOLIC PANEL - Abnormal; Notable for the following components:    Potassium 3.2 (*)    Chloride 94 (*)    Creatinine, Ser 6.14 (*)    Calcium 8.5 (*)    GFR calc non Af Amer 7 (*)    GFR calc Af Amer 8 (*)    All other components within normal limits  I-STAT TROPONIN, ED - Abnormal; Notable for the following components:   Troponin i, poc 0.48 (*)    All other components within normal limits  MRSA PCR SCREENING  CBC WITH DIFFERENTIAL/PLATELET  HIV ANTIBODY (ROUTINE TESTING)  TSH    EKG EKG Interpretation  Date/Time:  Saturday March 20 2018 16:44:42 EDT Ventricular Rate:  89 PR Interval:    QRS Duration: 96 QT Interval:  411 QTC Calculation: 501 R Axis:   71 Text Interpretation:  Sinus rhythm Left atrial enlargement LVH with secondary repolarization abnormality Anterior infarct, old Confirmed by Elnora Morrison [phone]) on 03/20/2018 7:56:36 PM   Radiology Dg Chest Portable 1 View  Result Date: 03/20/2018 CLINICAL DATA:  Hypotension after dialysis. EXAM: PORTABLE CHEST 1 VIEW COMPARISON:  February 16, 2018 FINDINGS: The VP shunt catheter begins in terminates outside of the field of view. The heart, hila, mediastinum, lungs, and pleura are unremarkable. IMPRESSION: No active disease. Electronically Signed   By: Dorise Bullion III M.D   On: 03/20/2018 15:57    Procedures Procedures (including critical care time)  Medications Ordered in ED Medications  atorvastatin (LIPITOR) tablet 40 mg (40 mg Oral Given 03/21/18 0940)  lactulose (CHRONULAC) 10 GM/15ML solution 10 g (has no administration in time range)  levETIRAcetam (KEPPRA) tablet 500 mg (500 mg Oral Given 03/21/18 2107)  multivitamin (RENA-VIT) tablet 1 tablet (1 tablet Oral Given 03/21/18 0940)  lidocaine-prilocaine (EMLA) cream 1 application (has no administration in time range)  pantoprazole (PROTONIX) EC tablet 40 mg (40 mg Oral Given 03/21/18 0940)  tamsulosin (FLOMAX) capsule 0.4 mg (0.4 mg Oral Given 03/21/18 0940)  acetaminophen (TYLENOL) tablet 650 mg (has no administration in time range)   ondansetron (ZOFRAN) injection 4 mg (has no administration in time range)  amiodarone (PACERONE) tablet 400 mg (400 mg Oral Given 03/21/18 2107)  cinacalcet (SENSIPAR) tablet 60 mg (has no administration in time  range)  calcitRIOL (ROCALTROL) capsule 0.25 mcg (has no administration in time range)  ferric gluconate (NULECIT) 62.5 mg in sodium chloride 0.9 % 100 mL IVPB (has no administration in time range)  potassium chloride 10 mEq in 100 mL IVPB (0 mEq Intravenous Stopped 03/20/18 2121)  potassium chloride SA (K-DUR,KLOR-CON) CR tablet 40 mEq (40 mEq Oral Given 03/21/18 1302)     Initial Impression / Assessment and Plan / ED Course  I have reviewed the triage vital signs and the nursing notes.  Pertinent labs & imaging results that were available during my care of the patient were reviewed by me and considered in my medical decision making (see chart for details).     Patient's laboratory revealed hypokalemia, replenishment given.  Additionally patient with an elevated troponin without recent measurements for comparison.  EKG shows A. fib with LVH and mild ST depression.  No indication for emergent dialysis identified.  Chest x-ray within normal limits.  Patient without oxygen requirement.  BNP elevated but not significantly.  An elevation in troponin EKG findings will admit the patient to the hospital for further evaluation and care  Final Clinical Impressions(s) / ED Diagnoses   Final diagnoses:  None    ED Discharge Orders    None       Chapman Moss, MD 03/21/18 8372    Elnora Morrison, MD 03/22/18 0041

## 2018-03-20 NOTE — ED Notes (Signed)
Called lab to check on status of CBC sent down at 1644 and lab states blood was spun down for BNP and are unable to run CBC. Pt refusing further lab draws.

## 2018-03-20 NOTE — H&P (Signed)
History and Physical    [name] JXB:[id number] DOB: 17-Aug-1961 DOA: 03/20/2018  PCP: Garwin Brothers, MD  Patient coming from: Home  I have personally briefly reviewed patient's old medical records in [place]  Chief Complaint: Tachycardia  HPI: [name] is a 57 y.o. female with medical history significant of ESRD dialysis TTS, SAH due to aneurysm clipped in 2013, also had another aneurysm coiled, and a 3rd aneurysm that was too small at the time to do anything with, embolic MCA CVA in 1308.  Loop recorder implant at that time, unknown source at that time.  HTN.  Patient presents to the ED with hypotension and tachycardia onset 1.5h into a 4h dialysis session.   ED Course: Initial SBP 88.  Trop 0.48, no CP.  Patient in A.Fib RVR rate 155 with diffuse ST depression especially inferior and lateral leads as demonstrated on first 2 EKGs at 1519 and 1512.  Patient apparently spontaneously converted out of A.fib RVR back into NSR, resolution of ST depression and normalization of BP on 1644 EKG.   Review of Systems: As per HPI otherwise 10 point review of systems negative.   Past Medical History:  Diagnosis Date  . Aneurysm ([place])   . CKD (chronic kidney disease) stage 3, GFR 30-59 ml/min (HCC)    Most recent creatinine 1.4 in 3/10  . Coronary atherosclerosis of native coronary artery    STEMI Feb 2009 - 70% distal LAD lesion c/w plaque rupture, DES distal RCA  . Diastolic heart failure    LVEF 60-65% with grade 2 diastolic dysfunction - July 2014  . HTN (hypertension)    Resistant  . Hypercholesterolemia   . Subarachnoid hemorrhage ([place])    03/2012  . Tobacco abuse    Resumed smoking half a pack a day. She was a smoker in the past and states she was abl eto stop in the past using nicotine patches    Past Surgical History:  Procedure Laterality Date  . ABDOMINAL HYSTERECTOMY    . BASCILIC VEIN TRANSPOSITION Right 05/13/2013   Procedure: [place];   Surgeon: Rosetta Posner, MD;  Location: [place];  Service: Vascular;  Laterality: Right;  . BRAIN SURGERY     2   . LOOP RECORDER IMPLANT N/A 11/14/2013   Procedure: LOOP RECORDER IMPLANT;  Surgeon: Evans Lance, MD;  [facility] CATH LAB;  Service: Cardiovascular;  Laterality: N/A;  . TEE WITHOUT CARDIOVERSION N/A 11/14/2013   Procedure: TRANSESOPHAGEAL ECHOCARDIOGRAM (TEE);  Surgeon: Candee Furbish, MD;  Location: [facility] ENDOSCOPY;  Service: Cardiovascular;  Laterality: N/A;  . VENTRICULOPERITONEAL SHUNT  03/27/2012   Procedure: SHUNT INSERTION VENTRICULAR-PERITONEAL;  Surgeon: Winfield Cunas, MD;  Location: [facility] NEURO ORS;  Service: Neurosurgery;  Laterality: N/A;  Ventricular-Peritoneal Shunt Insertion     reports that she quit smoking about a year ago. Her smoking use included cigarettes. She has a 7.00 pack-year smoking history. She has never used smokeless tobacco. She reports that she does not drink alcohol or use drugs.  Allergies  Allergen Reactions  . Ampicillin Hives and Rash    Has patient had a PCN reaction causing immediate rash, facial/tongue/throat swelling, SOB or lightheadedness with hypotension: No Has patient had a PCN reaction causing severe rash involving mucus membranes or skin necrosis: No Has patient had a PCN reaction that required hospitalization: No Has patient had a PCN reaction occurring within the last 10 years: No If all of the above answers are "NO", then may proceed with  Cephalosporin use.   Marland Kitchen Penicillins Hives and Rash    Has patient had a PCN reaction causing immediate rash, facial/tongue/throat swelling, SOB or lightheadedness with hypotension: Yes Has patient had a PCN reaction causing severe rash involving mucus membranes or skin necrosis: No Has patient had a PCN reaction that required hospitalization: No Has patient had a PCN reaction occurring within the last 10 years: No If all of the above answers are "NO", then may proceed with Cephalosporin use.      Family History  Problem Relation Age of Onset  . Heart disease Mother   . Heart disease Father   . Coronary artery disease Other        Premature in 1st degree relatives     Prior to Admission medications   Medication Sig Start Date End Date Taking? Authorizing Provider  amLODipine (NORVASC) 10 MG tablet Take 1 tablet (10 mg total) by mouth daily. Patient taking differently: Take 10 mg by mouth at bedtime.  11/07/13  Yes Caren Griffins, MD  atorvastatin (LIPITOR) 40 MG tablet Take 40 mg by mouth daily.   Yes [provider]  isosorbide mononitrate (IMDUR) 30 MG 24 hr tablet Take 2 tablets (60 mg total) by mouth daily. Patient taking differently: Take 30 mg by mouth daily.  11/07/13  Yes Gherghe, Vella Redhead, MD  labetalol (NORMODYNE) 200 MG tablet Take 1.5 tablets (300 mg total) by mouth 3 (three) times daily. Patient taking differently: Take 300 mg by mouth 2 (two) times daily.  11/07/13  Yes Caren Griffins, MD  lactulose (CHRONULAC) 10 GM/15ML solution Take 10 g by mouth daily as needed for moderate constipation.    Yes [provider]  levETIRAcetam (KEPPRA) 500 MG tablet Take 500 mg by mouth 2 (two) times daily.   Yes [provider]  lidocaine-prilocaine (EMLA) cream Apply 1 application topically 3 (three) times daily. Apply prior to [facility] 02/25/18  Yes [provider]  multivitamin (RENA-VIT) TABS tablet Take 1 tablet by mouth daily.   Yes [provider]  nitroGLYCERIN (NITROSTAT) 0.4 MG SL tablet Place 1 tablet (0.4 mg total) under the tongue every 5 (five) minutes as needed for chest pain. 05/18/13  Yes Short, Noah Delaine, MD  omeprazole (PRILOSEC) 20 MG capsule Take 20 mg by mouth daily.   Yes [provider]  tamsulosin (FLOMAX) 0.4 MG CAPS capsule Take 0.4 mg by mouth daily.   Yes [provider]    Physical Exam: Vitals:   03/20/18 1945 03/20/18 2000 03/20/18 2015 03/20/18 2030  BP: (!) 159/73 (!) 152/73 (!)  163/74 137/69  Pulse: 74 78 78 79  Resp: 16 15 (!) 21 (!) 22  Temp:      TempSrc:      SpO2: 99% 100% 100% 100%    Constitutional: NAD, calm, comfortable Eyes: PERRL, lids and conjunctivae normal ENMT: Mucous membranes are moist. Posterior pharynx clear of any exudate or lesions.Normal dentition.  Neck: normal, supple, no masses, no thyromegaly Respiratory: clear to auscultation bilaterally, no wheezing, no crackles. Normal respiratory effort. No accessory muscle use.  Cardiovascular: Regular rate and rhythm, no murmurs / rubs / gallops. No extremity edema. 2+ pedal pulses. No carotid bruits.  Abdomen: no tenderness, no masses palpated. No hepatosplenomegaly. Bowel sounds positive.  Musculoskeletal: no clubbing / cyanosis. No joint deformity upper and lower extremities. Good ROM, no contractures. Normal muscle tone.  Skin: no rashes, lesions, ulcers. No induration Neurologic: Good strength, but does have some mild residual aphasia  from old stroke Psychiatric: Normal judgment and insight. Alert and oriented x 3. Normal mood.    Labs on Admission: I have personally reviewed following labs and imaging studies  CBC: Recent Labs  Lab 03/20/18 1830  WBC 7.2  NEUTROABS 4.7  HGB 12.6  HCT 38.9  MCV 89.0  PLT 017   Basic Metabolic Panel: Recent Labs  Lab 03/20/18 1830  NA 139  K 3.0*  CL 95*  CO2 30  GLUCOSE 83  BUN 11  CREATININE 4.90*  CALCIUM 8.7*   GFR: CrCl cannot be calculated (Unknown ideal weight.). Liver Function Tests: Recent Labs  Lab 03/20/18 1830  AST 15  ALT 10*  ALKPHOS 93  BILITOT 0.7  PROT 6.8  ALBUMIN 3.4*   No results for input(s): LIPASE, AMYLASE in the last 168 hours. No results for input(s): AMMONIA in the last 168 hours. Coagulation Profile: No results for input(s): INR, PROTIME in the last 168 hours. Cardiac Enzymes: No results for input(s): CKTOTAL, CKMB, CKMBINDEX, TROPONINI in the last 168 hours. BNP (last 3 results) No results for  input(s): PROBNP in the last 8760 hours. HbA1C: No results for input(s): HGBA1C in the last 72 hours. CBG: No results for input(s): GLUCAP in the last 168 hours. Lipid Profile: No results for input(s): CHOL, HDL, LDLCALC, TRIG, CHOLHDL, LDLDIRECT in the last 72 hours. Thyroid Function Tests: No results for input(s): TSH, T4TOTAL, FREET4, T3FREE, THYROIDAB in the last 72 hours. Anemia Panel: No results for input(s): VITAMINB12, FOLATE, FERRITIN, TIBC, IRON, RETICCTPCT in the last 72 hours. Urine analysis:    Component Value Date/Time   COLORURINE YELLOW 11/08/2013 1304   APPEARANCEUR CLEAR 11/08/2013 1304   LABSPEC 1.009 11/08/2013 1304   PHURINE 7.5 11/08/2013 1304   GLUCOSEU NEGATIVE 11/08/2013 1304   HGBUR NEGATIVE 11/08/2013 1304   BILIRUBINUR NEGATIVE 11/08/2013 1304   KETONESUR NEGATIVE 11/08/2013 1304   PROTEINUR NEGATIVE 11/08/2013 1304   UROBILINOGEN 0.2 11/08/2013 1304   NITRITE NEGATIVE 11/08/2013 1304   LEUKOCYTESUR NEGATIVE 11/08/2013 1304    Radiological Exams on Admission: Dg Chest Portable 1 View  Result Date: 03/20/2018 CLINICAL DATA:  Hypotension after dialysis. EXAM: PORTABLE CHEST 1 VIEW COMPARISON:  February 16, 2018 FINDINGS: The VP shunt catheter begins in terminates outside of the field of view. The heart, hila, mediastinum, lungs, and pleura are unremarkable. IMPRESSION: No active disease. Electronically Signed   By: Dorise Bullion III M.D   On: 03/20/2018 15:57    EKG: Independently reviewed.  Assessment/Plan Principal Problem:   Atrial fibrillation with RVR (HCC) Active Problems:   History of subarachnoid hemorrhage   HTN (hypertension), malignant   Chronic diastolic heart failure (HCC)   ESRD (end stage renal disease) (HCC)   Seizure disorder (HCC)   History of embolic stroke    1. A.Fib RVR - spontaneously converted to NSR and remains NSR now 1. A.Fib pathway 2. CHADS vasc - High, At least a 7, and with prior embolic stroke in  5102 1. However patient also with prior SAH due to aneurysm, multiple aneurysms in past (1 clipped, 1 coiled, 1 too small at the time to do anything with) 2. Anticoagulation question much more complex 3. See Dr. Johny Chess note 4. Needs CTA head and neck as next step (will need to coordinate with nephrology so she can get dialysis after this). 5. No anticoagulation for now per Dr. Johny Chess recs 3. Since she is NSR and at baselinecurrently, I will hold off on starting any further new rate /  rhythm control meds for now. 4. Curbside cards in AM vs outpt follow up. 5. Check serial trops but suspect this was rate induced demand ischemia as demonstrated by ST changes on initial EKGs.  Patient CP free. 2. Malignant HTN - 1. Continue all home BP meds 3. Seizure disorder - continue keppra 4. ESRD - 1. Call nephrology in AM as above 2. Got 1.5 out of 4h session today 3. Need to schedule CTA (and likely dialysis after), see Dr. Johny Chess note.  DVT prophylaxis: SCDs Code Status: Full Family Communication: No family in room Disposition Plan: Home after admit Consults called: Spoke with Neurology, Call nephrology and cards in AM Admission status: Place in obs - strongly would consider convert to IP tomorrow for remainder of work up given her very complex situation.   Etta Quill DO [facility] Pager [phone]  If 7AM-7PM, please contact day team taking care of patient [email or web address] Password [facility]  03/20/2018, 9:14 PM

## 2018-03-20 NOTE — ED Notes (Signed)
IV team at bedside 

## 2018-03-21 ENCOUNTER — Encounter (HOSPITAL_COMMUNITY): Payer: Self-pay | Admitting: Physician Assistant

## 2018-03-21 DIAGNOSIS — I25118 Atherosclerotic heart disease of native coronary artery with other forms of angina pectoris: Secondary | ICD-10-CM

## 2018-03-21 DIAGNOSIS — I480 Paroxysmal atrial fibrillation: Secondary | ICD-10-CM | POA: Diagnosis not present

## 2018-03-21 DIAGNOSIS — Z955 Presence of coronary angioplasty implant and graft: Secondary | ICD-10-CM

## 2018-03-21 DIAGNOSIS — Z8673 Personal history of transient ischemic attack (TIA), and cerebral infarction without residual deficits: Secondary | ICD-10-CM

## 2018-03-21 DIAGNOSIS — I214 Non-ST elevation (NSTEMI) myocardial infarction: Secondary | ICD-10-CM | POA: Diagnosis not present

## 2018-03-21 DIAGNOSIS — I5032 Chronic diastolic (congestive) heart failure: Secondary | ICD-10-CM

## 2018-03-21 DIAGNOSIS — N186 End stage renal disease: Secondary | ICD-10-CM | POA: Diagnosis not present

## 2018-03-21 DIAGNOSIS — Z8679 Personal history of other diseases of the circulatory system: Secondary | ICD-10-CM

## 2018-03-21 DIAGNOSIS — I132 Hypertensive heart and chronic kidney disease with heart failure and with stage 5 chronic kidney disease, or end stage renal disease: Secondary | ICD-10-CM | POA: Diagnosis not present

## 2018-03-21 DIAGNOSIS — I4891 Unspecified atrial fibrillation: Secondary | ICD-10-CM

## 2018-03-21 LAB — BASIC METABOLIC PANEL
Anion gap: 14 (ref 5–15)
BUN: 19 mg/dL (ref 6–20)
CHLORIDE: 94 mmol/L — AB (ref 101–111)
CO2: 29 mmol/L (ref 22–32)
CREATININE: 6.14 mg/dL — AB (ref 0.44–1.00)
Calcium: 8.5 mg/dL — ABNORMAL LOW (ref 8.9–10.3)
GFR calc Af Amer: 8 mL/min — ABNORMAL LOW (ref >60)
GFR, EST NON AFRICAN AMERICAN: 7 mL/min — AB (ref >60)
Glucose, Bld: 85 mg/dL (ref 65–99)
Potassium: 3.2 mmol/L — ABNORMAL LOW (ref 3.5–5.1)
SODIUM: 137 mmol/L (ref 135–145)

## 2018-03-21 LAB — MRSA PCR SCREENING: MRSA BY PCR: NEGATIVE

## 2018-03-21 LAB — TROPONIN I
TROPONIN I: 2.42 ng/mL — AB (ref <0.03)
TROPONIN I: 2.53 ng/mL — AB (ref <0.03)
TROPONIN I: 2 ng/mL — AB (ref <0.03)

## 2018-03-21 LAB — TSH: TSH: 0.554 u[IU]/mL (ref 0.350–4.500)

## 2018-03-21 LAB — HIV ANTIBODY (ROUTINE TESTING W REFLEX): HIV Screen 4th Generation wRfx: NONREACTIVE

## 2018-03-21 MED ORDER — CALCITRIOL 0.25 MCG PO CAPS: 0.2500 ug | ORAL_CAPSULE | ORAL | Status: DC

## 2018-03-21 MED ORDER — AMIODARONE HCL 200 MG PO TABS
400.0000 mg | ORAL_TABLET | Freq: Two times a day (BID) | ORAL | Status: DC
  Administered 2018-03-21 – 2018-03-23 (×5): 400 mg via ORAL
  Filled 2018-03-21 (×5): qty 2

## 2018-03-21 MED ORDER — CINACALCET HCL 30 MG PO TABS
60.0000 mg | ORAL_TABLET | ORAL | Status: DC
  Filled 2018-03-21: qty 2

## 2018-03-21 MED ORDER — ISOSORBIDE MONONITRATE ER 30 MG PO TB24: 30.0000 mg | ORAL_TABLET | Freq: Every day | ORAL | Status: DC

## 2018-03-21 MED ORDER — SODIUM CHLORIDE 0.9 % IV SOLN
62.5000 mg | INTRAVENOUS | Status: DC
  Filled 2018-03-21: qty 5

## 2018-03-21 MED ORDER — METOPROLOL TARTRATE 12.5 MG HALF TABLET: 12.5000 mg | ORAL_TABLET | Freq: Two times a day (BID) | ORAL | Status: DC

## 2018-03-21 MED ORDER — POTASSIUM CHLORIDE CRYS ER 20 MEQ PO TBCR
40.0000 meq | EXTENDED_RELEASE_TABLET | Freq: Once | ORAL | Status: AC
  Administered 2018-03-21: 40 meq via ORAL
  Filled 2018-03-21: qty 2

## 2018-03-21 NOTE — Consult Note (Addendum)
Cardiology Consultation:   Patient ID: [name]; [id number]; 1961-01-16   Admit date: 03/20/2018 Date of Consult: 03/21/2018  Primary Care Provider: Garwin Brothers, MD Primary Cardiologist: Dr Aundra Dubin 2012  Primary Electrophysiologist:  Dr Lovena Le, [place] 2015   Patient Profile:   [name] is a 57 y.o. 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  History of Present Illness:   Ms. Apachito was in HD yesterday and states had pain in her stomach (points to her chest ). Unable to rate. She also felt her heart rate go up very high.   She was in rapid atrial fib. She has had episodes of this all week, more than once a day. She was sleeping so not sure if she was light-headed or not.   The chest pain has resolved. It was gone when she woke up.   She got her usual meds, including Imdur 30 mg, labetalol 300 mg and amlodipine 10 mg. However, these meds were d/c'd due to hypotension. SBP still 90s even after converting to SR.    Past Medical History:  Diagnosis Date  . Aneurysm ([place])   . Coronary atherosclerosis of native coronary artery    STEMI Feb 2009 - 70% distal LAD lesion c/w plaque rupture, DES distal RCA  . Diastolic heart failure    LVEF 60-65% with grade 2 diastolic dysfunction - July 2014  . ESRD (end stage renal disease) on dialysis ([place])   . HTN (hypertension)    Resistant  . Hypercholesterolemia   . Subarachnoid hemorrhage ([place])    03/2012  . Tobacco abuse    Resumed smoking half a pack a day. She was a smoker in the past and states she was abl eto stop in the past using nicotine patches    Past Surgical History:  Procedure Laterality Date  . ABDOMINAL HYSTERECTOMY      . BASCILIC VEIN TRANSPOSITION Right 05/13/2013   Procedure: Spring;  Surgeon: Rosetta Posner, MD;  Location: [facility];  Service: Vascular;  Laterality: Right;  . BRAIN SURGERY     2   . LOOP RECORDER IMPLANT N/A 11/14/2013   Procedure: LOOP RECORDER IMPLANT;  Surgeon: Evans Lance, MD;  Location: [facility] CATH LAB;  Service: Cardiovascular;  Laterality: N/A;  . TEE WITHOUT CARDIOVERSION N/A 11/14/2013   Procedure: TRANSESOPHAGEAL ECHOCARDIOGRAM (TEE);  Surgeon: Candee Furbish, MD;  Location: [facility] ENDOSCOPY;  Service: Cardiovascular;  Laterality: N/A;  . VENTRICULOPERITONEAL SHUNT  03/27/2012   Procedure: SHUNT INSERTION VENTRICULAR-PERITONEAL;  Surgeon: Winfield Cunas, MD;  Location: [facility] NEURO ORS;  Service: Neurosurgery;  Laterality: N/A;  Ventricular-Peritoneal Shunt Insertion     Prior to Admission medications   Medication Sig Start Date End Date Taking? Authorizing Provider  amLODipine (NORVASC) 10 MG tablet Take 1 tablet (10 mg total) by mouth daily. Patient taking differently: Take 10 mg by mouth at bedtime.  11/07/13  Yes Caren Griffins, MD  atorvastatin (LIPITOR) 40 MG tablet Take 40 mg by mouth daily.   Yes [provider]  isosorbide mononitrate (IMDUR) 30 MG 24 hr tablet Take 2 tablets (60 mg total) by mouth daily.  Patient taking differently: Take 30 mg by mouth daily.  11/07/13  Yes Gherghe, Vella Redhead, MD  labetalol (NORMODYNE) 200 MG tablet Take 1.5 tablets (300 mg total) by mouth 3 (three) times daily. Patient taking differently: Take 300 mg by mouth 2 (two) times daily.  11/07/13  Yes Caren Griffins, MD  lactulose (CHRONULAC) 10 GM/15ML solution Take 10 g by mouth daily as needed for moderate constipation.    Yes [provider]  levETIRAcetam (KEPPRA) 500 MG tablet Take 500 mg by mouth 2 (two) times daily.   Yes [provider]  lidocaine-prilocaine (EMLA) cream Apply 1 application topically 3 (three) times daily. Apply prior to [facility] 02/25/18   Yes [provider]  multivitamin (RENA-VIT) TABS tablet Take 1 tablet by mouth daily.   Yes [provider]  nitroGLYCERIN (NITROSTAT) 0.4 MG SL tablet Place 1 tablet (0.4 mg total) under the tongue every 5 (five) minutes as needed for chest pain. 05/18/13  Yes Short, Noah Delaine, MD  omeprazole (PRILOSEC) 20 MG capsule Take 20 mg by mouth daily.   Yes [provider]  tamsulosin (FLOMAX) 0.4 MG CAPS capsule Take 0.4 mg by mouth daily.   Yes [provider]    Inpatient Medications: Scheduled Meds: . atorvastatin  40 mg Oral Daily  . levETIRAcetam  500 mg Oral BID  . metoprolol tartrate  12.5 mg Oral BID  . multivitamin  1 tablet Oral Daily  . pantoprazole  40 mg Oral Daily  . tamsulosin  0.4 mg Oral Daily   Continuous Infusions:  PRN Meds: acetaminophen, lactulose, lidocaine-prilocaine, ondansetron (ZOFRAN) IV  Allergies:    Allergies  Allergen Reactions  . Ampicillin Hives and Rash    Has patient had a PCN reaction causing immediate rash, facial/tongue/throat swelling, SOB or lightheadedness with hypotension: No Has patient had a PCN reaction causing severe rash involving mucus membranes or skin necrosis: No Has patient had a PCN reaction that required hospitalization: No Has patient had a PCN reaction occurring within the last 10 years: No If all of the above answers are "NO", then may proceed with Cephalosporin use.   [name] Penicillins Hives and Rash    Has patient had a PCN reaction causing immediate rash, facial/tongue/throat swelling, SOB or lightheadedness with hypotension: Yes Has patient had a PCN reaction causing severe rash involving mucus membranes or skin necrosis: No Has patient had a PCN reaction that required hospitalization: No Has patient had a PCN reaction occurring within the last 10 years: No If all of the above answers are "NO", then may proceed with Cephalosporin use.     Social History:   Social History   Socioeconomic  History  . Marital status: Single    Spouse name: Not on file  . Number of children: Not on file  . Years of education: Not on file  . Highest education level: Not on file  Occupational History  . Not on file  Social Needs  . Financial resource strain: Not on file  . Food insecurity:    Worry: Not on file    Inability: Not on file  . Transportation needs:    Medical: Not on file    Non-medical: Not on file  Tobacco Use  . Smoking status: Former Smoker    Packs/day: 0.20    Years: 35.00    Pack years: 7.00    Types: Cigarettes    Last attempt to quit: 03/20/2017    Years since quitting: 1.0  . Smokeless tobacco:  Never Used  . Tobacco comment: 1/2 ppd   Substance and Sexual Activity  . Alcohol use: No  . Drug use: No  . Sexual activity: Never  Lifestyle  . Physical activity:    Days per week: Not on file    Minutes per session: Not on file  . Stress: Not on file  Relationships  . Social connections:    Talks on phone: Not on file    Gets together: Not on file    Attends religious service: Not on file    Active member of club or organization: Not on file    Attends meetings of clubs or organizations: Not on file    Relationship status: Not on file  . Intimate partner violence:    Fear of current or ex partner: Not on file    Emotionally abused: Not on file    Physically abused: Not on file    Forced sexual activity: Not on file  Other Topics Concern  . Not on file  Social History Narrative   Lives by herself but helps care for her 8 grandchildren.     Family History:   Family History  Problem Relation Age of Onset  . Heart disease Mother   . Heart disease Father   . Coronary artery disease Other        Premature in 1st degree relatives   Family Status:  Family Status  Relation Name Status  . Mother  Deceased  . Father  Deceased  . Other  (Not Specified)    ROS:  Please see the history of present illness.  All other ROS reviewed and negative.      Physical Exam/Data:   Vitals:   03/21/18 0448 03/21/18 0449 03/21/18 0753 03/21/18 0942  BP: (!) 94/58  (!) 97/55 (!) 91/59  Pulse: 74  72   Resp: 18     Temp: 97.7 F (36.5 C)  97.9 F (36.6 C)   TempSrc: Oral  Oral   SpO2: 99%  98%   Weight:  104 lb 9.6 oz (47.4 kg)    Height:        Intake/Output Summary (Last 24 hours) at 03/21/2018 1034 Last data filed at 03/21/2018 0900 Gross per 24 hour  Intake 340 ml  Output -  Net 340 ml   Filed Weights   03/20/18 2309 03/21/18 0449  Weight: 104 lb 6.4 oz (47.4 kg) 104 lb 9.6 oz (47.4 kg)   Body mass index is 19.76 kg/m.  General:  Well nourished, well developed, in no acute distress HEENT: normal Lymph: no adenopathy Neck: no JVD Endocrine:  No thryomegaly Vascular: No carotid bruits; 4/4 extremity pulses 2+, without bruits  Cardiac:  normal S1, S2; RRR; 2-3/6 murmur  Lungs:  clear to auscultation bilaterally, no wheezing, rhonchi or rales  Abd: soft, nontender, no hepatomegaly  Ext: no edema Musculoskeletal:  No deformities, BUE and BLE strength normal and equal Skin: warm and dry  Neuro:  CNs 2-12 intact, no focal abnormalities noted; memory is poor and pt speech does not always match events Psych:  Normal affect   EKG:  The EKG was personally reviewed and demonstrates: 06/01, atrial fib, HR 155, inferolateral ST depression 06/02, SR, HR 73 III now inverted Telemetry:  Telemetry was personally reviewed and demonstrates:  Atrial fib>>SR w/ no sig ectopy  Relevant CV Studies:  ECHO: ORDERED  ECHO: 2015 - Left ventricle: The cavity size was normal. Wall thickness was normal. Systolic function was normal. The  estimated ejection fraction was in the range of 60% to 65%. - Aortic valve: No evidence of vegetation. - Mitral valve: No evidence of vegetation. - Left atrium: No evidence of thrombus in the atrial cavity or appendage. No evidence of thrombus in the appendage. - Right atrium: No evidence of thrombus in  the atrial cavity or appendage. No evidence of thrombus in the atrial cavity or appendage. - Atrial septum: No defect or patent foramen ovale was identified. Echo contrast study showed no right-to-left atrial level shunt, following an increase in RA pressure induced by provocative maneuvers. Echo contrast study showed no right-to-left atrial level shunt, following an increase in RA pressure induced by provocative maneuvers. - Tricuspid valve: No evidence of vegetation. - Pulmonic valve: No evidence of vegetation. - Superior vena cava: The study excluded a thrombus. - Pericardium, extracardiac: A trivial pericardial effusion was identified. Impressions: - No cardiac source of emboli was indentified.  Laboratory Data:  Chemistry Recent Labs  Lab 03/20/18 1830 03/21/18 0707  NA 139 137  K 3.0* 3.2*  CL 95* 94*  CO2 30 29  GLUCOSE 83 85  BUN 11 19  CREATININE 4.90* 6.14*  CALCIUM 8.7* 8.5*  GFRNONAA 9* 7*  GFRAA 10* 8*  ANIONGAP 14 14    Lab Results  Component Value Date   ALT 10 (L) 03/20/2018   AST 15 03/20/2018   ALKPHOS 93 03/20/2018   BILITOT 0.7 03/20/2018   Hematology Recent Labs  Lab 03/20/18 1830  WBC 7.2  RBC 4.37  HGB 12.6  HCT 38.9  MCV 89.0  MCH 28.8  MCHC 32.4  RDW 12.9  PLT 221   Cardiac Enzymes Recent Labs  Lab 03/21/18 0108 03/21/18 0707  TROPONINI 2.42* 2.53*    Recent Labs  Lab 03/20/18 1836  TROPIPOC 0.48*    BNP Recent Labs  Lab 03/20/18 1643  BNP 219.9*     TSH:  Lab Results  Component Value Date   TSH 1.511 11/08/2013   Lipids: Lab Results  Component Value Date   CHOL 122 11/09/2013   HDL 47 11/09/2013   LDLCALC 57 11/09/2013   TRIG 92 11/09/2013   CHOLHDL 2.6 11/09/2013   HgbA1c: Lab Results  Component Value Date   HGBA1C 5.9 (H) 11/09/2013   Magnesium:  Magnesium  Date Value Ref Range Status  10/21/2013 1.9 1.5 - 2.5 mg/dL Final     Radiology/Studies:  Dg Chest Portable 1  View  Result Date: 03/20/2018 CLINICAL DATA:  Hypotension after dialysis. EXAM: PORTABLE CHEST 1 VIEW COMPARISON:  February 16, 2018 FINDINGS: The VP shunt catheter begins in terminates outside of the field of view. The heart, hila, mediastinum, lungs, and pleura are unremarkable. IMPRESSION: No active disease. Electronically Signed   By: Dorise Bullion III M.D   On: 03/20/2018 15:57    Assessment and Plan:   Principal Problem: 1.  Atrial fibrillation with RVR ([place]) - spont conversion to SR but BP is not high enough to allow for consistent BB/CCB dosing - add amio 400 bid for now - Need Neurology to weigh in on possibility of DAPT or anticoagulation  2. NSTEMI - CP resolved after pt in SR, but she has known dz and ez are higher than expected for HD alone. - may be demand ischemia in setting of rapid afib - no heparin/ASA unless Neuro says ok - echo ordered  - if EF nl and no WMA, consider MV and then discuss cath if high risk  Otherwise, per IM  Active Problems:   History of subarachnoid hemorrhage   HTN (hypertension), malignant   Chronic diastolic heart failure (HCC)   ESRD (end stage renal disease) (HCC)   Seizure disorder (HCC)   History of embolic stroke     For questions or updates, please contact [facility] Please consult [email or web address] for contact info under Cardiology/STEMI.   Signed, Rosaria Ferries, PA-C  03/21/2018 10:34 AM   The patient was seen and examined, and I agree with the history, physical exam, assessment and plan as documented above, with modifications as noted below. I have also personally reviewed all relevant documentation, old records, labs, and both radiographic and cardiovascular studies. I have also independently interpreted old and new ECG's.  57 yr old woman with aforementioned complex past medical history including coronary artery disease with RCA stent and residual LAD and circumflex disease, ESRD on HD, intracranial hemorrhage with aneurysmal  clipping and coiling, embolic CVA who developed rapid atrial fibrillation and troponin elevation.  ECG on 6/1 at 1519 showed rapid atrial fibrillation with inferolateral ST depressions. Subsequent ECG shows resolution of this in sinus rhythm.  Troponins up to 2.53 which is higher than what I would expect even with ESRD, although there may be a component of demand ischemia.  She developed hypotension and amlodipine, labetalol, and Imdur were all held.  She was started on metoprolol 12.5 mg bid. She is not on antiplatelet therapy nor systemic anticoagulation.  The primary issue at present is whether or not antiplatelet therapy and systemic anticoagulation can be safely administered given her history of intracranial hemorrhage with prior aneurysmal coiling and clipping.  She requires neurology consultation to obtain their opinion.  At this point, will hold off on IV heparin and/or ASA.  Will start amiodarone 400 mg bid to suppress atrial fibrillation.  No beta blockers or calcium channel blockers as BP is too low.  If neurology says antiplatelet/anticoagulation therapy is permissible, would start IV heparin and plan for cath. If not, could consider Lexiscan Myoview.  She is on Lipitor 40 mg.  Await echocardiogram to assess LV function and for new wall motion abnormalities to help better plan management strategy.    Kate Sable, MD, [facility]  03/21/2018 11:18 AM

## 2018-03-21 NOTE — Consult Note (Addendum)
[facility] Renal Consultation Note    Indication for Consultation:  Management of ESRD/hemodialysis; anemia, hypertension/volume and secondary hyperparathyroidism PCP: Garwin Brothers, MD  HPI: [name] is a 57 y.o. female with ESRD presumed secondary to HTN on TTS HD at [facility] since March 2019.  PMHx also significant for [facility], VP shunt, embolic CVA with loop recorder, CAD with STEMI and prior DES. She had the acute onset of an elevated heart rate into the 140s with BP much lower than usual about half way into dialysis yesterday. The patient "felt bad".   The charge RN called the on call provider who advised she be sent to the ED for emergent evaluation. Evaluation confirmed pt had afib with RVR rate 155 with ST depression in several leads then patient spontaneously converted to NSR with the resolution of ST depression and normalization of BP per admitting H and P.  At present, she denies pain or SOB.  Lives with one of her daughters. Previously had lived at [facility] for a 3-4 years prior to starting HD, then family brought her home for better care.  She has been admitted to OBS status.  Past Medical History:  Diagnosis Date  . Anterior cerebral artery aneurysm    1996 AT [facility]  . Coronary atherosclerosis of native coronary artery    STEMI Feb 2009 - 70% distal LAD lesion c/w plaque rupture, DES distal RCA  . Diastolic heart failure    LVEF 60-65% with grade 2 diastolic dysfunction - July 2014  . ESRD (end stage renal disease) on dialysis ([place])   . HTN (hypertension)    Resistant  . Hypercholesterolemia   . Subarachnoid hemorrhage ([place])    03/2012  . Tobacco abuse    Resumed smoking half a pack a day. She was a smoker in the past and states she was abl eto stop in the past using nicotine patches   Past Surgical History:  Procedure Laterality Date  . ABDOMINAL HYSTERECTOMY    . BASCILIC VEIN TRANSPOSITION Right 05/13/2013   Procedure: [place];   Surgeon: Rosetta Posner, MD;  Location: [place];  Service: Vascular;  Laterality: Right;  . BRAIN SURGERY     2   . LOOP RECORDER IMPLANT N/A 11/14/2013   Procedure: LOOP RECORDER IMPLANT;  Surgeon: Evans Lance, MD;  Location: [facility] CATH LAB;  Service: Cardiovascular;  Laterality: N/A;  . TEE WITHOUT CARDIOVERSION N/A 11/14/2013   Procedure: TRANSESOPHAGEAL ECHOCARDIOGRAM (TEE);  Surgeon: Candee Furbish, MD;  Location: [facility] ENDOSCOPY;  Service: Cardiovascular;  Laterality: N/A;  . VENTRICULOPERITONEAL SHUNT  03/27/2012   Procedure: SHUNT INSERTION VENTRICULAR-PERITONEAL;  Surgeon: Winfield Cunas, MD;  Location: [place] NEURO ORS;  Service: Neurosurgery;  Laterality: N/A;  Ventricular-Peritoneal Shunt Insertion   Family History  Problem Relation Age of Onset  . Heart disease Mother   . Heart disease Father   . Coronary artery disease Other        Premature in 1st degree relatives   Social History:  reports that she quit smoking about a year ago. Her smoking use included cigarettes. She has a 7.00 pack-year smoking history. She has never used smokeless tobacco. She reports that she does not drink alcohol or use drugs. Allergies  Allergen Reactions  . Ampicillin Hives and Rash    Has patient had a PCN reaction causing immediate rash, facial/tongue/throat swelling, SOB or lightheadedness with hypotension: No Has patient had a PCN reaction causing severe rash involving mucus membranes or skin  necrosis: No Has patient had a PCN reaction that required hospitalization: No Has patient had a PCN reaction occurring within the last 10 years: No If all of the above answers are "NO", then may proceed with Cephalosporin use.   Marland Kitchen Penicillins Hives and Rash    Has patient had a PCN reaction causing immediate rash, facial/tongue/throat swelling, SOB or lightheadedness with hypotension: Yes Has patient had a PCN reaction causing severe rash involving mucus membranes or skin necrosis: No Has patient had a PCN reaction that  required hospitalization: No Has patient had a PCN reaction occurring within the last 10 years: No If all of the above answers are "NO", then may proceed with Cephalosporin use.    Prior to Admission medications   Medication Sig Start Date End Date Taking? Authorizing Provider  amLODipine (NORVASC) 10 MG tablet Take 1 tablet (10 mg total) by mouth daily. Patient taking differently: Take 10 mg by mouth at bedtime.  11/07/13  Yes Caren Griffins, MD  atorvastatin (LIPITOR) 40 MG tablet Take 40 mg by mouth daily.   Yes [provider]  isosorbide mononitrate (IMDUR) 30 MG 24 hr tablet Take 2 tablets (60 mg total) by mouth daily. Patient taking differently: Take 30 mg by mouth daily.  11/07/13  Yes Gherghe, Vella Redhead, MD  labetalol (NORMODYNE) 200 MG tablet Take 1.5 tablets (300 mg total) by mouth 3 (three) times daily. Patient taking differently: Take 300 mg by mouth 2 (two) times daily.  11/07/13  Yes Caren Griffins, MD  lactulose (CHRONULAC) 10 GM/15ML solution Take 10 g by mouth daily as needed for moderate constipation.    Yes [provider]  levETIRAcetam (KEPPRA) 500 MG tablet Take 500 mg by mouth 2 (two) times daily.   Yes [provider]  lidocaine-prilocaine (EMLA) cream Apply 1 application topically 3 (three) times daily. Apply prior to [facility] 02/25/18  Yes [provider]  multivitamin (RENA-VIT) TABS tablet Take 1 tablet by mouth daily.   Yes [provider]  nitroGLYCERIN (NITROSTAT) 0.4 MG SL tablet Place 1 tablet (0.4 mg total) under the tongue every 5 (five) minutes as needed for chest pain. 05/18/13  Yes Short, Noah Delaine, MD  omeprazole (PRILOSEC) 20 MG capsule Take 20 mg by mouth daily.   Yes [provider]  tamsulosin (FLOMAX) 0.4 MG CAPS capsule Take 0.4 mg by mouth daily.   Yes [provider]   Current Facility-Administered Medications  Medication Dose Route Frequency Provider Last Rate Last Dose  .  acetaminophen (TYLENOL) tablet 650 mg  650 mg Oral Q4H PRN Etta Quill, DO      . amiodarone (PACERONE) tablet 400 mg  400 mg Oral BID Kate Sable A, MD      . atorvastatin (LIPITOR) tablet 40 mg  40 mg Oral Daily Jennette Kettle M, DO   40 mg at 03/21/18 0940  . lactulose (CHRONULAC) 10 GM/15ML solution 10 g  10 g Oral Daily PRN Etta Quill, DO      . levETIRAcetam (KEPPRA) tablet 500 mg  500 mg Oral BID Jennette Kettle M, DO   500 mg at 03/21/18 0940  . lidocaine-prilocaine (EMLA) cream 1 application  1 application Topical TID PRN Etta Quill, DO      . multivitamin (RENA-VIT) tablet 1 tablet  1 tablet Oral Daily Jennette Kettle M, DO   1 tablet at 03/21/18 0940  . ondansetron (ZOFRAN) injection 4 mg  4 mg Intravenous Q6H PRN Etta Quill,  DO      . pantoprazole (PROTONIX) EC tablet 40 mg  40 mg Oral Daily Jennette Kettle M, DO   40 mg at 03/21/18 0940  . tamsulosin (FLOMAX) capsule 0.4 mg  0.4 mg Oral Daily Jennette Kettle M, DO   0.4 mg at 03/21/18 0940   Labs: Basic Metabolic Panel: Recent Labs  Lab 03/20/18 1830 03/21/18 0707  NA 139 137  K 3.0* 3.2*  CL 95* 94*  CO2 30 29  GLUCOSE 83 85  BUN 11 19  CREATININE 4.90* 6.14*  CALCIUM 8.7* 8.5*   Liver Function Tests: Recent Labs  Lab 03/20/18 1830  AST 15  ALT 10*  ALKPHOS 93  BILITOT 0.7  PROT 6.8  ALBUMIN 3.4*   CBC: Recent Labs  Lab 03/20/18 1830  WBC 7.2  NEUTROABS 4.7  HGB 12.6  HCT 38.9  MCV 89.0  PLT 221   Cardiac Enzymes: Recent Labs  Lab 03/21/18 0108 03/21/18 0707  TROPONINI 2.42* 2.53*   Studies/Results: Dg Chest Portable 1 View  Result Date: 03/20/2018 CLINICAL DATA:  Hypotension after dialysis. EXAM: PORTABLE CHEST 1 VIEW COMPARISON:  February 16, 2018 FINDINGS: The VP shunt catheter begins in terminates outside of the field of view. The heart, hila, mediastinum, lungs, and pleura are unremarkable. IMPRESSION: No active disease. Electronically Signed   By: Dorise Bullion III  M.D   On: 03/20/2018 15:57    ROS: As per HPI. Patient unable to give history. Significant memory loss  Physical Exam: Vitals:   03/21/18 0448 03/21/18 0449 03/21/18 0753 03/21/18 0942  BP: (!) 94/58  (!) 97/55 (!) 91/59  Pulse: 74  72   Resp: 18     Temp: 97.7 F (36.5 C)  97.9 F (36.6 C)   TempSrc: Oral  Oral   SpO2: 99%  98%   Weight:  47.4 kg (104 lb 9.6 oz)    Height:         General: thin woman appear old than age breathing easily on room air Head: NCAT sclera not icteric MMM Neck: Supple. No JVD Lungs: CTA bilaterally without wheezes, rales, or rhonchi. Breathing is unlabored. Heart: RRR no rub 2/6 murmur Abdomen: soft NT + BS Lower extremities:without edema or ischemic changes, SCDs in place Neuro: cannot tell me her DOB, tells me she has 6 or 7 daughters sons, in [place], at [place] - doesn't know name of HD unit  Psych: slightly anxious affect Dialysis Access: right upper AVF + bruit  Dialysis Orders: [facility] TTS EDW 47 2 K 2 Ca 4 hr AVF right upper heparin none sensipar 50 TIW venofer 50 per week and mircera 30 q 2 weeks- last dose 5/30; has not been on calcitriol since starting HD (admission note at her first outpatinet HD tmt states calcitriol had been stopped-not clear why) Recent labs:  hgb 9.6 trending down on Mircera 30 q 2 weeks iPTH 619 -, K upper 3s past two months  Assessment/Plan: 1. Afib with RVR - with spontaneous conversion to NSR- complex prior history - work up in progress/cards and neurology following. Avoid low K during HD Trop elevated but flat - likely demand ischemia/NSTEMI; started on amiodarone. Not a good candidate for anticoagulation given complex history 2. ESRD on HD since March-  TTS @ [facility] - K here after 2 hour of HD was 3 - up to 3.2 - may need to d/c on 3 K bath due to high risk of hypokalemia with afib. Due for HD Tuesday -  no acute need at this time. 3. Hypertension/volume  -clear CXR upon admission; continue home BB meds- outpatient HD BP  somewhat variable.  Noted to be at EDW prior to HD Saturday which is unusually and there was a report of her vomiting pre HD Saturday of undigested foods prior to getting on the machine. 4. Anemia  - hgb markedly higher than last outpatient hgb of 9.6 5/30 - no ESA for now - weekly Fe 5. Metabolic bone disease -  Ca 8.7 -lower than usual outpatient Ca - continue senipar with HD - start calcitriol 0.25 with HD she has no binders listed on admission med list or med list from her HD unit- follow P and add as needed 6. Nutrition - 3.4 - renal diet/vits/supplements 7. Seizure disorder - on keppra 8. Hx SAH,embolic stroke - w/ significant cognitive dysfunction  Myriam Jacobson, PA-C [place] [phone] 03/21/2018, 12:01 PM   Pt seen, examined, agree w assess/plan as above with additions as indicated.  Kelly Splinter MD [facility] pager [phone]    cell [phone] 03/21/2018, 5:56 PM

## 2018-03-21 NOTE — Progress Notes (Signed)
Notified of trop:  Will get repeat EKG, but I am informed that patient remains completely asymptomatic: no CP, no SOB, still in NSR on monitor.  Probably just ongoing elevation in trop from demand ischemia due to RVR event earlier in evening.  Am very doubtful of acute plaque wall rupture MI.

## 2018-03-21 NOTE — Progress Notes (Signed)
PROGRESS NOTE  [name] JME:[id number] DOB: 1960-12-01 DOA: 03/20/2018 PCP: Garwin Brothers, MD  HPI/Recap of past 24 hours:  Currently in NSR, sbp low 100s, She denies pain or discomfort  She has memory impairment since the stroke, she is not oriented to time or place, she can not provide reliable history  I talked to her daughter over the phone  Daughter reports patient is started on HD 12/2017, patient previously [facility] in [facility], recently family brought her back to home  Assessment/Plan: Principal Problem:   Atrial fibrillation with RVR ([place]) Active Problems:   History of subarachnoid hemorrhage   HTN (hypertension), malignant   Chronic diastolic heart failure ([place])   ESRD (end stage renal disease) ([place])   Seizure disorder ([place])   History of embolic stroke  New onset of afib H/o loop  Recorder Not sure if patient is a candidate for anticoagulation, neurology recommended repeat cta head/neck to evaluate aneurysm),  since patient is new on HD , will need nephrology input regarding whether to proceed with cta or not, will await nephrology recommendation. Cardiology consulted   Troponin elevation She currently denies chest pain, ekg no acute changes Troponin peak at 2.5 Echo pending Cardiology consulted  Hypokalemia: replace k  ESRD TTS avf on the right  H/o HTN, now hypotension Hold home bp meds   H/o left mca cva in 2015 ct head from 01/2018 "Chronic left frontal parietal infarct with right frontal lobe encephalomalacia and old left cerebellar, thalamic and basal ganglial infarcts."  H/o brain aneurysms s/p subarachinoid hemorrhage s/p clipping  and VP shunt by Dr Cyndy Freeze in 2013 (per neruosurgery Dr Christella Noa note from 04/05/2012 She was also found to have 4 aneurysms, 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 This was treated with endovascular coiling She had a permanent VP shunt placed at 100 mm pressure codman  programmable valve)   H/o seizure On keppra  Code Status: full, comfirmed  Family Communication: patient in room, daughter over the phone  Disposition Plan: not ready for discharge   Consultants:  Cardiology  Nephrology  neurology  Procedures:  none  Antibiotics:  none   Objective: BP (!) 97/55 (BP Location: Left Arm)   Pulse 72   Temp 97.9 F (36.6 C) (Oral)   Resp 18   Ht 5\' 1"  (1.549 m)   Wt 47.4 kg (104 lb 9.6 oz)   SpO2 98%   BMI 19.76 kg/m   Intake/Output Summary (Last 24 hours) at 03/21/2018 0931 Last data filed at 03/21/2018 0449 Gross per 24 hour  Intake 100 ml  Output -  Net 100 ml   Filed Weights   03/20/18 2309 03/21/18 0449  Weight: 47.4 kg (104 lb 6.4 oz) 47.4 kg (104 lb 9.6 oz)    Exam: Patient is examined daily including today on 03/21/2018, exams remain the same as of yesterday except that has changed    General:  NAD, oriented to person only, alert, follow commands  Cardiovascular: RRR  Respiratory: CTABL  Abdomen: Soft/ND/NT, positive BS  Musculoskeletal: No Edema  Neuro: alert, oriented to person only  Data Reviewed: Basic Metabolic Panel: Recent Labs  Lab 03/20/18 1830  NA 139  K 3.0*  CL 95*  CO2 30  GLUCOSE 83  BUN 11  CREATININE 4.90*  CALCIUM 8.7*   Liver Function Tests: Recent Labs  Lab 03/20/18 1830  AST 15  ALT 10*  ALKPHOS 93  BILITOT 0.7  PROT  6.8  ALBUMIN 3.4*   No results for input(s): LIPASE, AMYLASE in the last 168 hours. No results for input(s): AMMONIA in the last 168 hours. CBC: Recent Labs  Lab 03/20/18 1830  WBC 7.2  NEUTROABS 4.7  HGB 12.6  HCT 38.9  MCV 89.0  PLT 221   Cardiac Enzymes:   Recent Labs  Lab 03/21/18 0108 03/21/18 0707  TROPONINI 2.42* 2.53*   BNP (last 3 results) Recent Labs    03/20/18 1643  BNP 219.9*    ProBNP (last 3 results) No results for input(s): PROBNP in the last 8760 hours.  CBG: No results for input(s): GLUCAP in the last 168  hours.  Recent Results (from the past 240 hour(s))  MRSA PCR Screening     Status: None   Collection Time: 03/20/18 11:30 PM  Result Value Ref Range Status   MRSA by PCR NEGATIVE NEGATIVE Final    Comment:        The GeneXpert MRSA Assay (FDA approved for NASAL specimens only), is one component of a comprehensive MRSA colonization surveillance program. It is not intended to diagnose MRSA infection nor to guide or monitor treatment for MRSA infections. Performed at [facility], [place] [address] [place], [place] [address]      Studies: Dg Chest Portable 1 View  Result Date: 03/20/2018 CLINICAL DATA:  Hypotension after dialysis. EXAM: PORTABLE CHEST 1 VIEW COMPARISON:  February 16, 2018 FINDINGS: The VP shunt catheter begins in terminates outside of the field of view. The heart, hila, mediastinum, lungs, and pleura are unremarkable. IMPRESSION: No active disease. Electronically Signed   By: Dorise Bullion III M.D   On: 03/20/2018 15:57    Scheduled Meds: . amLODipine  10 mg Oral QHS  . atorvastatin  40 mg Oral Daily  . [START ON 03/22/2018] isosorbide mononitrate  30 mg Oral Daily  . labetalol  300 mg Oral BID  . levETIRAcetam  500 mg Oral BID  . multivitamin  1 tablet Oral Daily  . pantoprazole  40 mg Oral Daily  . tamsulosin  0.4 mg Oral Daily    Continuous Infusions:   Time spent: 22mins, case discussed with cardiology/nephrology I have personally reviewed and interpreted on  03/21/2018 daily labs, tele strips, imagings as discussed above under date review session and assessment and plans.  I reviewed all nursing notes, pharmacy notes, consultant notes,  vitals, pertinent old records  I have discussed plan of care as described above with RN , patient and family on 03/21/2018   Florencia Reasons MD, PhD  [facility] Pager [phone]. If 7PM-7AM, please contact night-coverage at [email or web address], password [facility] 03/21/2018, 9:31 AM  LOS: 0 days

## 2018-03-21 NOTE — Progress Notes (Signed)
Patient arrived to unit, alert and oriented. Vital signs stable . MD orders reviewed with patient. Call bell and personal items placed with reach of patient. Patient informed to call for help before getting out of bed.

## 2018-03-21 NOTE — Progress Notes (Addendum)
CRITICAL VALUE ALERT  Critical Value:  Troponin 2.42  Date & Time Notied:  03/21/2018 0214  Provider Notified: Dr Alcario Drought   Orders Received/Actions taken: EKG

## 2018-03-22 ENCOUNTER — Other Ambulatory Visit (HOSPITAL_COMMUNITY): Payer: Medicare Other

## 2018-03-22 DIAGNOSIS — Z8679 Personal history of other diseases of the circulatory system: Secondary | ICD-10-CM | POA: Diagnosis not present

## 2018-03-22 DIAGNOSIS — N186 End stage renal disease: Secondary | ICD-10-CM | POA: Diagnosis not present

## 2018-03-22 DIAGNOSIS — I5032 Chronic diastolic (congestive) heart failure: Secondary | ICD-10-CM | POA: Diagnosis not present

## 2018-03-22 DIAGNOSIS — I4891 Unspecified atrial fibrillation: Secondary | ICD-10-CM | POA: Diagnosis not present

## 2018-03-22 LAB — CBC WITH DIFFERENTIAL/PLATELET
Abs Immature Granulocytes: 0 10*3/uL (ref 0.0–0.1)
BASOS ABS: 0 10*3/uL (ref 0.0–0.1)
BASOS PCT: 1 %
EOS ABS: 0.1 10*3/uL (ref 0.0–0.7)
EOS PCT: 2 %
HCT: 31.7 % — ABNORMAL LOW (ref 36.0–46.0)
HEMOGLOBIN: 10.1 g/dL — AB (ref 12.0–15.0)
Immature Granulocytes: 0 %
Lymphocytes Relative: 35 %
Lymphs Abs: 1.6 10*3/uL (ref 0.7–4.0)
MCH: 28.1 pg (ref 26.0–34.0)
MCHC: 31.9 g/dL (ref 30.0–36.0)
MCV: 88.3 fL (ref 78.0–100.0)
MONO ABS: 0.3 10*3/uL (ref 0.1–1.0)
Monocytes Relative: 7 %
Neutro Abs: 2.4 10*3/uL (ref 1.7–7.7)
Neutrophils Relative %: 55 %
PLATELETS: 248 10*3/uL (ref 150–400)
RBC: 3.59 MIL/uL — AB (ref 3.87–5.11)
RDW: 12.9 % (ref 11.5–15.5)
WBC: 4.5 10*3/uL (ref 4.0–10.5)

## 2018-03-22 LAB — RENAL FUNCTION PANEL
ALBUMIN: 3.3 g/dL — AB (ref 3.5–5.0)
Anion gap: 15 (ref 5–15)
BUN: 28 mg/dL — AB (ref 6–20)
CALCIUM: 8.9 mg/dL (ref 8.9–10.3)
CO2: 26 mmol/L (ref 22–32)
CREATININE: 7.66 mg/dL — AB (ref 0.44–1.00)
Chloride: 99 mmol/L — ABNORMAL LOW (ref 101–111)
GFR calc Af Amer: 6 mL/min — ABNORMAL LOW (ref >60)
GFR, EST NON AFRICAN AMERICAN: 5 mL/min — AB (ref >60)
GLUCOSE: 119 mg/dL — AB (ref 65–99)
Phosphorus: 4.8 mg/dL — ABNORMAL HIGH (ref 2.5–4.6)
Potassium: 3.8 mmol/L (ref 3.5–5.1)
SODIUM: 140 mmol/L (ref 135–145)

## 2018-03-22 MED ORDER — CALCITRIOL 0.25 MCG PO CAPS
0.2500 ug | ORAL_CAPSULE | ORAL | Status: DC
Start: 2018-03-23 — End: 2018-03-24
  Administered 2018-03-23: 0.25 ug via ORAL

## 2018-03-22 MED ORDER — CINACALCET HCL 30 MG PO TABS
60.0000 mg | ORAL_TABLET | ORAL | Status: DC
  Administered 2018-03-23: 60 mg via ORAL
  Filled 2018-03-22 (×2): qty 2

## 2018-03-22 NOTE — Procedures (Signed)
I was present at this session.  I have reviewed the session itself and made appropriate changes.  HD via aVF,  tol well.  Access press ok.   [name] 6/3/20192:09 PM

## 2018-03-22 NOTE — Progress Notes (Signed)
PROGRESS NOTE  [name] TIR:[id number] DOB: 26-Sep-1961 DOA: 03/20/2018 PCP: Garwin Brothers, MD  HPI/Recap of past 24 hours:  Remain in NSR, bp stable, She denies pain or discomfort  Demented elderly not able to provide history   Assessment/Plan: Principal Problem:   Atrial fibrillation with RVR ([place]) Active Problems:   History of subarachnoid hemorrhage   HTN (hypertension), malignant   Chronic diastolic heart failure ([place])   ESRD (end stage renal disease) ([place])   Seizure disorder ([place])   History of embolic stroke  New onset of afib H/o loop  Recorder Now in sinus rhythm She is started on amiodarone, will follow cardiology recommendation  neurology recommended repeat cta head/neck to evaluate aneurysm,  since patient is new on HD , will need nephrology input regarding whether to proceed with cta or not. Case discussed with nephrology Dr Jimmy Footman who does not recommend start anticoagulation ( risk overweight benefit), he recommends palliative care for goals of care, no need to proceed with CTA    Troponin elevation She currently denies chest pain, ekg no acute changes Troponin peak at 2.5 Echo pending Cardiology consulted  Hypokalemia: replace k  ESRD TTS avf on the right  H/o HTN, now hypotension Hold home bp meds   H/o left mca cva in 2015 ct head from 01/2018 "Chronic left frontal parietal infarct with right frontal lobe encephalomalacia and old left cerebellar, thalamic and basal ganglial infarcts."  H/o brain aneurysms s/p subarachinoid hemorrhage s/p clipping  and VP shunt by Dr Cyndy Freeze in 2013 (per neruosurgery Dr Christella Noa note from 04/05/2012 She was also found to have 4 aneurysms, 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 This was treated with endovascular coiling She had a permanent VP shunt placed at 100 mm pressure codman programmable valve)   H/o seizure On keppra  Code Status: full, comfirmed  Family  Communication: patient in room, daughter over the phone on 6/2  Disposition Plan: pending palliative care input Pending echocardiogram Possible discharge on 6/4 to home with home health   Consultants:  Cardiology  Nephrology  Neurology  Palliative care  Procedures:  HD  Antibiotics:  none   Objective: BP 126/65 (BP Location: Left Arm)   Pulse 68   Temp 98.4 F (36.9 C) (Oral)   Resp 18   Ht 5\' 1"  (1.549 m)   Wt 48.2 kg (106 lb 3.2 oz)   SpO2 98%   BMI 20.07 kg/m   Intake/Output Summary (Last 24 hours) at 03/22/2018 0851 Last data filed at 03/21/2018 1952 Gross per 24 hour  Intake 960 ml  Output -  Net 960 ml   Filed Weights   03/20/18 2309 03/21/18 0449 03/22/18 0448  Weight: 47.4 kg (104 lb 6.4 oz) 47.4 kg (104 lb 9.6 oz) 48.2 kg (106 lb 3.2 oz)    Exam: Patient is examined daily including today on 03/22/2018, exams remain the same as of yesterday except that has changed    General:  NAD, oriented to person only, alert, follow commands  Cardiovascular: RRR  Respiratory: CTABL  Abdomen: Soft/ND/NT, positive BS  Musculoskeletal: No Edema  Neuro: alert, oriented to person only  Data Reviewed: Basic Metabolic Panel: Recent Labs  Lab 03/20/18 1830 03/21/18 0707  NA 139 137  K 3.0* 3.2*  CL 95* 94*  CO2 30 29  GLUCOSE 83 85  BUN 11 19  CREATININE 4.90* 6.14*  CALCIUM 8.7* 8.5*   Liver Function Tests: Recent Labs  Lab 03/20/18  1830  AST 15  ALT 10*  ALKPHOS 93  BILITOT 0.7  PROT 6.8  ALBUMIN 3.4*   No results for input(s): LIPASE, AMYLASE in the last 168 hours. No results for input(s): AMMONIA in the last 168 hours. CBC: Recent Labs  Lab 03/20/18 1830  WBC 7.2  NEUTROABS 4.7  HGB 12.6  HCT 38.9  MCV 89.0  PLT 221   Cardiac Enzymes:   Recent Labs  Lab 03/21/18 0108 03/21/18 0707 03/21/18 1255  TROPONINI 2.42* 2.53* 2.00*   BNP (last 3 results) Recent Labs    03/20/18 1643  BNP 219.9*    ProBNP (last 3  results) No results for input(s): PROBNP in the last 8760 hours.  CBG: No results for input(s): GLUCAP in the last 168 hours.  Recent Results (from the past 240 hour(s))  MRSA PCR Screening     Status: None   Collection Time: 03/20/18 11:30 PM  Result Value Ref Range Status   MRSA by PCR NEGATIVE NEGATIVE Final    Comment:        The GeneXpert MRSA Assay (FDA approved for NASAL specimens only), is one component of a comprehensive MRSA colonization surveillance program. It is not intended to diagnose MRSA infection nor to guide or monitor treatment for MRSA infections. Performed at [facility], [place] [address], [place], [place] [address]      Studies: No results found.  Scheduled Meds: . amiodarone  400 mg Oral BID  . atorvastatin  40 mg Oral Daily  . calcitRIOL  0.25 mcg Oral Q M,W,F-HD  . cinacalcet  60 mg Oral Q M,W,F-HD  . levETIRAcetam  500 mg Oral BID  . multivitamin  1 tablet Oral Daily  . pantoprazole  40 mg Oral Daily  . tamsulosin  0.4 mg Oral Daily    Continuous Infusions: . [START ON 03/24/2018] ferric gluconate (FERRLECIT/NULECIT) IV       Time spent: 20mins, case discussed with nephrology I have personally reviewed and interpreted on  03/22/2018 daily labs, tele strips, imagings as discussed above under date review session and assessment and plans.  I reviewed all nursing notes, pharmacy notes, consultant notes,  vitals, pertinent old records  I have discussed plan of care as described above with RN , patient  on 03/22/2018   Florencia Reasons MD, PhD  [facility] Pager [phone]. If 7PM-7AM, please contact night-coverage at [email or web address], password [email or web address] 03/22/2018, 8:51 AM  LOS: 0 days

## 2018-03-22 NOTE — Progress Notes (Addendum)
Subjective:  No cos   Objective Vital signs in last 24 hours: Vitals:   03/21/18 2008 03/22/18 0021 03/22/18 0446 03/22/18 0448  BP: 109/61 109/64 (!) 112/47   Pulse: 70 67 65   Resp: 16 18 18    Temp: 97.7 F (36.5 C) 97.8 F (36.6 C) 98.1 F (36.7 C)   TempSrc: Oral Oral Oral   SpO2: 99% 100% 99%   Weight:    48.2 kg (106 lb 3.2 oz)  Height:       Weight change: 0.816 kg (1 lb 12.8 oz)  Physical Exam: General: Alert /NAD ,pleasantly confused  AAF, "Im in [place]  Not  sure the day or  when I dialyze " Heart: RRR, 1/6 sem, no rub or gallop  Lungs: CTA bilat.  Abdomen: BS pos , soft , non tender, non distended   Extremities: no pedal edema  Dialysis Access: pos. bruit  RUA AVF   Dialysis Orders: [facility] TTS EDW 47 2 K 2 Ca 4 hr AVF right upper heparin none sensipar 50 TIW venofer 50 per week and mircera 30 q 2 weeks- last dose 5/30; has not been on calcitriol since starting HD (admission note at her first outpatinet HD tmt states calcitriol had been stopped-not clear why) Recent labs:  hgb 9.6 trending down on Mircera 30 q 2 weeks iPTH 619 -, K upper 3s past two months  Problem/Plan:  1. ESRD - TTS - K 6/01 (after 2 hour of HD )=3.0  > 6/2- up to 3.2  Given 40 meq po K yest.  Recheck  k today - HD tomorrow start with 3.0 k bath /pre hd lab  Tuesday -  2. Afib with RVR - with spontaneous conversion to NSR- complex prior history - work up in progress/cards and neurology following. Avoid low K during HD Trop elevated but flat - likely demand ischemia/NSTEMI; started on amiodarone. Not a good candidate for anticoagulation given complex history 3. Hypertension/volume  - CXR= NAD on admission;/  bp lowish since admit  Holding home med  Amlodipine 10 mg/and  Labetalol 200 mg tid    4. Anemia  - hgb12.6 on admit = markedly higher than last outpatient hgb of 9.6 5/30 - no ESA for now - weekly Fe/ follow up  hgb trend  5. Metabolic bone disease -  Ca 8.7 >8.5 -lower than usual outpatient Ca -  continue senipar with HD - start calcitriol 0.25 with HD she has no binders listed on admission med list or med list from her HD unit- follow P and add as needed 6. Nutrition - 3.4 - renal diet/vits/supplements 7. Seizure disorder - on meds Hx SAH,embolic stroke/ with HO cognitive deficits  -about  Baseline   Ernest Haber, PA-C [place] [phone] 03/22/2018,8:00 AM  LOS: 0 days   I have seen and examined this patient and agree with the plan of care seen, examined,eval, discussed with PA, primary MD.  With unproven benefit and ^ risk of anticoag, with Afib in ESRD, the patients past hx and severe dementia, risk of anticoag>benefit .  Daylen Hack 03/22/2018, 10:13 AM   Labs: Basic Metabolic Panel: Recent Labs  Lab 03/20/18 1830 03/21/18 0707  NA 139 137  K 3.0* 3.2*  CL 95* 94*  CO2 30 29  GLUCOSE 83 85  BUN 11 19  CREATININE 4.90* 6.14*  CALCIUM 8.7* 8.5*   Liver Function Tests: Recent Labs  Lab 03/20/18 1830  AST 15  ALT 10*  ALKPHOS  93  BILITOT 0.7  PROT 6.8  ALBUMIN 3.4*  CBC: Recent Labs  Lab 03/20/18 1830  WBC 7.2  NEUTROABS 4.7  HGB 12.6  HCT 38.9  MCV 89.0  PLT 221   Cardiac Enzymes: Recent Labs  Lab 03/21/18 0108 03/21/18 0707 03/21/18 1255  TROPONINI 2.42* 2.53* 2.00*   Medications: . [START ON 03/24/2018] ferric gluconate (FERRLECIT/NULECIT) IV     . amiodarone  400 mg Oral BID  . atorvastatin  40 mg Oral Daily  . calcitRIOL  0.25 mcg Oral Q M,W,F-HD  . cinacalcet  60 mg Oral Q M,W,F-HD  . levETIRAcetam  500 mg Oral BID  . multivitamin  1 tablet Oral Daily  . pantoprazole  40 mg Oral Daily  . tamsulosin  0.4 mg Oral Daily

## 2018-03-22 NOTE — Evaluation (Signed)
Physical Therapy Evaluation Patient Details Name: [name] MRN: [id number] DOB: 1961-08-15 Today's Date: 03/22/2018   History of Present Illness  57 y.o. 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  Clinical Impression  Pt admitted with above diagnosis. Pt currently with functional limitations due to the deficits listed below (see PT Problem List). Pt was able to ambulate with min guard assist in hallway with slight instability at times.  Will benefit from use of RW.  Pt states she has 24 hour care.  If this is accurate, HHPT recommended.   Pt will benefit from skilled PT to increase their independence and safety with mobility to allow discharge to the venue listed below.      Follow Up Recommendations Home health PT    Equipment Recommendations  Rolling walker with 5" wheels    Recommendations for Other Services       Precautions / Restrictions Precautions Precautions: Fall Restrictions Weight Bearing Restrictions: No      Mobility  Bed Mobility Overal bed mobility: Independent                Transfers Overall transfer level: Needs assistance Equipment used: None Transfers: Sit to/from Stand Sit to Stand: Min guard         General transfer comment: Pt was able to perform sit to stand with steadying assist due to pt slightly flexed and unsure on feet.    Ambulation/Gait Ambulation/Gait assistance: Min assist Ambulation Distance (Feet): 150 Feet Assistive device: None Gait Pattern/deviations: Step-through pattern;Decreased stride length;Trunk flexed   Gait velocity interpretation: <1.31 ft/sec, indicative of household ambulator General Gait Details: Pt was able to ambulate with min  guard assist as pt slightly unsteady at times needing steadying assist without use of device.  Pt would benefit from RW and she agrees she would use it.    Stairs            Wheelchair Mobility    Modified Rankin (Stroke Patients Only)       Balance Overall balance assessment: Needs assistance Sitting-balance support: No upper extremity supported;Feet supported Sitting balance-Leahy Scale: Fair     Standing balance support: Bilateral upper extremity supported;During functional activity Standing balance-Leahy Scale: Poor Standing balance comment: relies on UE support                             Pertinent Vitals/Pain Pain Assessment: No/denies pain  VSS  Home Living Family/patient expects to be discharged to:: Private residence Living Arrangements: Children(daughter) Available Help at Discharge: Family;Available 24 hours/day Type of Home: Apartment Home Access: Stairs to enter Entrance Stairs-Rails: Right Entrance Stairs-Number of Steps: 13 Home Layout: One level Home Equipment: None Additional Comments: Pt states she has 24 hour care.  Pt confused at times not following questioning.     Prior Function Level of Independence: Independent               Hand Dominance   Dominant Hand: Right    Extremity/Trunk Assessment   Upper Extremity Assessment Upper Extremity Assessment: Defer to OT evaluation    Lower Extremity Assessment Lower Extremity Assessment: Generalized weakness  Cervical / Trunk Assessment Cervical / Trunk Assessment: Kyphotic  Communication   Communication: Expressive difficulties  Cognition Arousal/Alertness: Awake/alert Behavior During Therapy: Flat affect Overall Cognitive Status: Impaired/Different from baseline Area of Impairment: Orientation;Following commands;Safety/judgement;Memory;Awareness;Problem solving                 Orientation Level: Disoriented to;Time;Situation;Place   Memory: Decreased  short-term memory Following Commands: Follows one step commands with increased time Safety/Judgement: Decreased awareness of safety;Decreased awareness of deficits   Problem Solving: Decreased initiation;Requires tactile cues        General Comments      Exercises General Exercises - Lower Extremity Ankle Circles/Pumps: AROM;Both;10 reps;Supine   Assessment/Plan    PT Assessment Patient needs continued PT services  PT Problem List Decreased activity tolerance;Decreased balance;Decreased mobility;Decreased knowledge of use of DME;Decreased safety awareness;Decreased knowledge of precautions;Decreased cognition       PT Treatment Interventions DME instruction;Gait training;Functional mobility training;Therapeutic activities;Therapeutic exercise;Balance training;Patient/family education    PT Goals (Current goals can be found in the Care Plan section)  Acute Rehab PT Goals Patient Stated Goal: to go home PT Goal Formulation: With patient Time For Goal Achievement: 04/05/18 Potential to Achieve Goals: Good    Frequency Min 3X/week   Barriers to discharge        Co-evaluation               AM-PAC PT "6 Clicks" Daily Activity  Outcome Measure Difficulty turning over in bed (including adjusting bedclothes, sheets and blankets)?: None Difficulty moving from lying on back to sitting on the side of the bed? : None Difficulty sitting down on and standing up from a chair with arms (e.g., wheelchair, bedside commode, etc,.)?: A Little Help needed moving to and from a bed to chair (including a wheelchair)?: A Little Help needed walking in hospital room?: A Little Help needed climbing 3-5 steps with a railing? : A Lot 6 Click Score: 19    End of Session Equipment Utilized During Treatment: Gait belt Activity Tolerance: Patient limited by fatigue Patient left: in chair;with call bell/phone within reach;with chair alarm set Nurse Communication: Mobility status PT Visit  Diagnosis: Unsteadiness on feet (R26.81);Muscle weakness (generalized) (M62.81)    Time: 4818-5631 PT Time Calculation (min) (ACUTE ONLY): 19 min   Charges:   PT Evaluation $PT Eval Moderate Complexity: 1 Mod     PT G Codes:        Jairo Bellew,PT Acute Rehabilitation [phone] (pager)   Denice Paradise 03/22/2018, 1:33 PM

## 2018-03-22 NOTE — Progress Notes (Addendum)
Progress Note  Patient Name: [name] Date of Encounter: 03/22/2018  Primary Cardiologist: No primary care provider on file.  Subjective   Feeling well this morning. No chest pain or dyspnea.   Inpatient Medications    Scheduled Meds: . amiodarone  400 mg Oral BID  . atorvastatin  40 mg Oral Daily  . calcitRIOL  0.25 mcg Oral Q M,W,F-HD  . cinacalcet  60 mg Oral Q M,W,F-HD  . levETIRAcetam  500 mg Oral BID  . multivitamin  1 tablet Oral Daily  . pantoprazole  40 mg Oral Daily  . tamsulosin  0.4 mg Oral Daily   Continuous Infusions: . [START ON 03/24/2018] ferric gluconate (FERRLECIT/NULECIT) IV     PRN Meds: acetaminophen, lactulose, lidocaine-prilocaine, ondansetron (ZOFRAN) IV   Vital Signs    Vitals:   03/22/18 0021 03/22/18 0446 03/22/18 0448 03/22/18 0829  BP: 109/64 (!) 112/47  126/65  Pulse: 67 65  68  Resp: 18 18    Temp: 97.8 F (36.6 C) 98.1 F (36.7 C)  98.4 F (36.9 C)  TempSrc: Oral Oral  Oral  SpO2: 100% 99%  98%  Weight:   106 lb 3.2 oz (48.2 kg)   Height:        Intake/Output Summary (Last 24 hours) at 03/22/2018 0943 Last data filed at 03/21/2018 1952 Gross per 24 hour  Intake 720 ml  Output -  Net 720 ml   Filed Weights   03/20/18 2309 03/21/18 0449 03/22/18 0448  Weight: 104 lb 6.4 oz (47.4 kg) 104 lb 9.6 oz (47.4 kg) 106 lb 3.2 oz (48.2 kg)    Telemetry    SR - Personally Reviewed  ECG    N/a - Personally Reviewed  Physical Exam   General: Thin AA female appearing in no acute distress. Head: Normocephalic, atraumatic.  Neck: Supple, no JVD. Lungs:  Resp regular and unlabored, CTA. Heart: RRR, S1, S2, soft systolic murmur; no rub. Abdomen: Soft, non-tender, non-distended with normoactive bowel sounds. Extremities: No clubbing, cyanosis, edema. Distal pedal pulses are 2+ bilaterally. Neuro: Alert and oriented X 3. Moves all extremities spontaneously. Psych: Normal affect.  Labs    Chemistry Recent Labs  Lab  03/20/18 1830 03/21/18 0707  NA 139 137  K 3.0* 3.2*  CL 95* 94*  CO2 30 29  GLUCOSE 83 85  BUN 11 19  CREATININE 4.90* 6.14*  CALCIUM 8.7* 8.5*  PROT 6.8  --   ALBUMIN 3.4*  --   AST 15  --   ALT 10*  --   ALKPHOS 93  --   BILITOT 0.7  --   GFRNONAA 9* 7*  GFRAA 10* 8*  ANIONGAP 14 14     Hematology Recent Labs  Lab 03/20/18 1830  WBC 7.2  RBC 4.37  HGB 12.6  HCT 38.9  MCV 89.0  MCH 28.8  MCHC 32.4  RDW 12.9  PLT 221    Cardiac Enzymes Recent Labs  Lab 03/21/18 0108 03/21/18 0707 03/21/18 1255  TROPONINI 2.42* 2.53* 2.00*    Recent Labs  Lab 03/20/18 1836  TROPIPOC 0.48*     BNP Recent Labs  Lab 03/20/18 1643  BNP 219.9*     DDimer No results for input(s): DDIMER in the last 168 hours.    Radiology    Dg Chest Portable 1 View  Result Date: 03/20/2018 CLINICAL DATA:  Hypotension after dialysis. EXAM: PORTABLE CHEST 1 VIEW COMPARISON:  February 16, 2018 FINDINGS: The VP shunt catheter begins in  terminates outside of the field of view. The heart, hila, mediastinum, lungs, and pleura are unremarkable. IMPRESSION: No active disease. Electronically Signed   By: Dorise Bullion III M.D   On: 03/20/2018 15:57    Cardiac Studies   TTE: pending.   Patient Profile     57 y.o. 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  Assessment & Plan    1. New onset Afib RVR: She spontaneously converted to SR yesterday. Amiodarone 400mg  BID added yesterday. Remains on SR. Has a complex hx of subarachnoid hemorrhage and needs neurology input prior to considering [place]. Blood pressures were soft yesterday but improved today. CHA2DS2-VASc Score of at least 3.   2. Elevated Troponin: Peaked at  2.53. Per Dr. Bronson Ing will need neurology input prior to invasive procedure given hx of ICH. Could be combination of demand ischemia an ESRD. Echo pending today.   3. ESRD on HD: per nephrology  4. Hypokalemia: Replete per primary   5. HL: on statin. Last LDL 57 on 1/15.   Signed, Reino Bellis, NP  03/22/2018, 9:43 AM  Pager # [phone]   For questions or updates, please contact [place] Please consult [email or web address] for contact info under Cardiology/STEMI.    Patient seen and examined. Agree with assessment and plan. No chest pain. Converted to sinus rhythm with amiodarone.  Will decreased dose to 400 mg daily. HR now in the 60s. No chest pain or dyspnea.  Echo is pending to assess LV fxn, systolic murmur, atrial size and WMA.    Troy Sine, MD, [facility] 03/22/2018 12:35 PM

## 2018-03-23 ENCOUNTER — Ambulatory Visit (HOSPITAL_BASED_OUTPATIENT_CLINIC_OR_DEPARTMENT_OTHER): Payer: 59

## 2018-03-23 ENCOUNTER — Other Ambulatory Visit (HOSPITAL_COMMUNITY): Payer: Medicare Other

## 2018-03-23 DIAGNOSIS — I5032 Chronic diastolic (congestive) heart failure: Secondary | ICD-10-CM | POA: Diagnosis not present

## 2018-03-23 DIAGNOSIS — I4891 Unspecified atrial fibrillation: Secondary | ICD-10-CM | POA: Diagnosis not present

## 2018-03-23 DIAGNOSIS — I361 Nonrheumatic tricuspid (valve) insufficiency: Secondary | ICD-10-CM

## 2018-03-23 DIAGNOSIS — N186 End stage renal disease: Secondary | ICD-10-CM | POA: Diagnosis not present

## 2018-03-23 LAB — CBC
HCT: 36.4 % (ref 36.0–46.0)
Hemoglobin: 11.9 g/dL — ABNORMAL LOW (ref 12.0–15.0)
MCH: 29.1 pg (ref 26.0–34.0)
MCHC: 32.7 g/dL (ref 30.0–36.0)
MCV: 89 fL (ref 78.0–100.0)
PLATELETS: 201 10*3/uL (ref 150–400)
RBC: 4.09 MIL/uL (ref 3.87–5.11)
RDW: 13.2 % (ref 11.5–15.5)
WBC: 5.5 10*3/uL (ref 4.0–10.5)

## 2018-03-23 LAB — ECHOCARDIOGRAM COMPLETE
Height: 61 in
WEIGHTICAEL: 1587.31 [oz_av]

## 2018-03-23 LAB — BASIC METABOLIC PANEL
Anion gap: 14 (ref 5–15)
BUN: 14 mg/dL (ref 6–20)
CHLORIDE: 94 mmol/L — AB (ref 101–111)
CO2: 30 mmol/L (ref 22–32)
CREATININE: 4.43 mg/dL — AB (ref 0.44–1.00)
Calcium: 9.2 mg/dL (ref 8.9–10.3)
GFR calc Af Amer: 12 mL/min — ABNORMAL LOW (ref >60)
GFR, EST NON AFRICAN AMERICAN: 10 mL/min — AB (ref >60)
Glucose, Bld: 101 mg/dL — ABNORMAL HIGH (ref 65–99)
POTASSIUM: 3.7 mmol/L (ref 3.5–5.1)
SODIUM: 138 mmol/L (ref 135–145)

## 2018-03-23 LAB — HEPATITIS B SURFACE ANTIGEN: HEP B S AG: NEGATIVE

## 2018-03-23 LAB — HEPATITIS B SURFACE ANTIBODY,QUALITATIVE: Hep B S Ab: NONREACTIVE

## 2018-03-23 MED ORDER — AMIODARONE HCL 200 MG PO TABS
400.0000 mg | ORAL_TABLET | Freq: Every day | ORAL | Status: DC
  Administered 2018-03-24: 400 mg via ORAL
  Filled 2018-03-23: qty 2

## 2018-03-23 MED ORDER — CALCITRIOL 0.25 MCG PO CAPS
ORAL_CAPSULE | ORAL | Status: AC
  Filled 2018-03-23: qty 1

## 2018-03-23 MED ORDER — ONDANSETRON HCL 4 MG/2ML IJ SOLN
INTRAMUSCULAR | Status: AC
  Filled 2018-03-23: qty 2

## 2018-03-23 NOTE — Progress Notes (Addendum)
Progress Note  Patient Name: [name] Date of Encounter: 03/23/2018  Primary Cardiologist: Dr. Aundra Dubin (2012), Dr. Bronson Ing this admission  Subjective   Pt denies chest pain but says she doesn't feel well. Unable to tell me why.  Inpatient Medications    Scheduled Meds: . amiodarone  400 mg Oral BID  . atorvastatin  40 mg Oral Daily  . calcitRIOL  0.25 mcg Oral Q T,Th,Sa-HD  . cinacalcet  60 mg Oral Q T,Th,Sa-HD  . levETIRAcetam  500 mg Oral BID  . multivitamin  1 tablet Oral Daily  . pantoprazole  40 mg Oral Daily  . tamsulosin  0.4 mg Oral Daily   Continuous Infusions: . [START ON 03/24/2018] ferric gluconate (FERRLECIT/NULECIT) IV     PRN Meds: acetaminophen, lactulose, lidocaine-prilocaine, ondansetron (ZOFRAN) IV   Vital Signs    Vitals:   03/23/18 1030 03/23/18 1045 03/23/18 1100 03/23/18 1105  BP: (!) 131/53 118/77 (!) 102/55 (!) 116/42  Pulse: 73 68 (!) 59 71  Resp:    20  Temp:    97.8 F (36.6 C)  TempSrc:    Oral  SpO2:    98%  Weight:    99 lb 3.3 oz (45 kg)  Height:        Intake/Output Summary (Last 24 hours) at 03/23/2018 1221 Last data filed at 03/23/2018 1105 Gross per 24 hour  Intake 358 ml  Output 3000 ml  Net -2642 ml   Filed Weights   03/23/18 0445 03/23/18 0655 03/23/18 1105  Weight: 105 lb 1.6 oz (47.7 kg) 100 lb 15.5 oz (45.8 kg) 99 lb 3.3 oz (45 kg)    Telemetry    sinus - Personally Reviewed  ECG    No new tracings - Personally Reviewed  Physical Exam   GEN: No acute distress.   Neck: No JVD Cardiac: RRR, + murmur Respiratory: Clear to auscultation bilaterally. GI: Soft, nontender, non-distended  MS: No edema; No deformity. Neuro:  Nonfocal  Psych: Normal affect   Labs    Chemistry Recent Labs  Lab 03/20/18 1830 03/21/18 0707 03/22/18 0855 03/23/18 0421  NA 139 137 140 138  K 3.0* 3.2* 3.8 3.7  CL 95* 94* 99* 94*  CO2 30 29 26 30   GLUCOSE 83 85 119* 101*  BUN 11 19 28* 14  CREATININE 4.90* 6.14* 7.66*  4.43*  CALCIUM 8.7* 8.5* 8.9 9.2  PROT 6.8  --   --   --   ALBUMIN 3.4*  --  3.3*  --   AST 15  --   --   --   ALT 10*  --   --   --   ALKPHOS 93  --   --   --   BILITOT 0.7  --   --   --   GFRNONAA 9* 7* 5* 10*  GFRAA 10* 8* 6* 12*  ANIONGAP 14 14 15 14      Hematology Recent Labs  Lab 03/20/18 1830 03/22/18 1356 03/23/18 0421  WBC 7.2 4.5 5.5  RBC 4.37 3.59* 4.09  HGB 12.6 10.1* 11.9*  HCT 38.9 31.7* 36.4  MCV 89.0 88.3 89.0  MCH 28.8 28.1 29.1  MCHC 32.4 31.9 32.7  RDW 12.9 12.9 13.2  PLT 221 248 201    Cardiac Enzymes Recent Labs  Lab 03/21/18 0108 03/21/18 0707 03/21/18 1255  TROPONINI 2.42* 2.53* 2.00*    Recent Labs  Lab 03/20/18 1836  TROPIPOC 0.48*     BNP Recent Labs  Lab  03/20/18 1643  BNP 219.9*     DDimer No results for input(s): DDIMER in the last 168 hours.   Radiology    No results found.  Cardiac Studies   Echo pending  Patient Profile     57 y.o. 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  Assessment & Plan    1. New onset Afib RVR during HD - pt has since converted to NSR - amiodarone decreased to 400 mg daily yesterday - continue 400 mg amiodarone for 7 days then reduce to 200 mg daily - not a candidate for anticoagulation   2. Elevated troponin - will discuss with attending ASA use - likely not a candidate - no intervention planned at this time - echo pending   3. ESRD on HD - per nephrology   4. HLD - last LDL 57 1/15   For questions or updates, please contact [place] Please consult [email or web address] for contact info under Cardiology/STEMI.      Signed, Tami Lin [place], PA  03/23/2018, 12:21 PM     Patient seen and  examined. Agree with assessment and plan. Maintaining sinus rhythm with rate I  The 70s.  Echo shows EF 60 - 65%; Grade 1 DD, AV sclerosis, and small non hemodynamically significant pericardial effusion. No chest pain or dyspnea.   Troy Sine, MD, [facility] 03/23/2018 5:55 PM

## 2018-03-23 NOTE — Care Management Note (Addendum)
Case Management Note  Patient Details  Name: [name] MRN: [id number] Date of Birth: 28-Mar-1961  Subjective/Objective: Pt presented for Tachycardia- hx of ESRD TTS. Plan for transition home- PT recommendations for [facility] PT. Pt is agreeable to services. Agency List provided and pt chose [facility] for Services. Pt declines DME RW.                   Action/Plan: CM did make referral to [place] with [facility] for [facility] PT Services. SOC to begin within 24-48 hours post transition home. No further needs from CM @ this time.   Expected Discharge Date:                  Expected Discharge Plan:  [place]  In-House Referral:  NA  Discharge planning Services  CM Consult  Post Acute Care Choice:  Home Health Choice offered to:  Patient  DME Arranged:  N/A DME Agency:  NA  HH Arranged:  PT HH Agency:  [place]  Status of Service:  Completed, signed off  If discussed at [place] of Stay Meetings, dates discussed:    Additional Comments: 03-24-18 [place], RN, BSN [phone] CM did speak with patient's daughter and the patient is active with [facility]. Services stopped with [facility]. CM did make [place] with [facility] SOC to begin within 24-48 hours post transition home.  Bethena Roys, RN 03/23/2018, 12:21 PM

## 2018-03-23 NOTE — Progress Notes (Signed)
PROGRESS NOTE  [name] NOM:[id number] DOB: 03-02-1961 DOA: 03/20/2018 PCP: Garwin Brothers, MD  HPI/Recap of past 24 hours:  Patient is seen in dialysis unit Remain in NSR, bp stable, She denies pain or discomfort  Demented elderly not able to provide history, she is oriented to person and place, not to time   Assessment/Plan: Principal Problem:   Atrial fibrillation with RVR ([place]) Active Problems:   History of subarachnoid hemorrhage   HTN (hypertension), malignant   Chronic diastolic heart failure ([place])   ESRD (end stage renal disease) ([place])   Seizure disorder ([place])   History of embolic stroke  New onset of afib H/o loop  Recorder Now in sinus rhythm She is started on amiodarone, will follow cardiology recommendation  neurology recommended repeat cta head/neck to evaluate aneurysm,  since patient is new on HD , will need nephrology input regarding whether to proceed with cta or not. Case discussed with nephrology Dr Jimmy Footman who does not recommend start anticoagulation ( risk overweight benefit), he recommends palliative care for goals of care, no need to proceed with CTA    Troponin elevation She currently denies chest pain, ekg no acute changes Troponin peak at 2.5 Echo pending Cardiology consulted  Hypokalemia: replace k  ESRD TTS avf on the right She received extra session on 6/3, now she is back on regular schedule Nephrology input appreciated   H/o HTN,  She presented with hypotension home bp meds held on admission bp improving   H/o left mca cva in 2015 ct head from 01/2018 "Chronic left frontal parietal infarct with right frontal lobe encephalomalacia and old left cerebellar, thalamic and basal ganglial infarcts."  H/o brain aneurysms s/p subarachinoid hemorrhage s/p clipping  and VP shunt by Dr Cyndy Freeze in 2013 (per neruosurgery Dr Christella Noa note from 04/05/2012 She was also found to have 4 aneurysms, the most irregular and 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 This was treated with endovascular coiling She had a permanent VP shunt placed at 100 mm pressure codman programmable valve)   H/o seizure On keppra  Code Status: full, comfirmed  Family Communication: patient in room, daughter over the phone on 6/2  Disposition Plan: pending palliative care input Pending echocardiogram and cardiology recommendation Possible discharge on 6/5 to home with home health (home health orders placed)   Consultants:  Cardiology  Nephrology  Neurology  Palliative care  Procedures:  HD on 6/3 and 6/4  Antibiotics:  none   Objective: BP (!) 116/42 (BP Location: Left Arm)   Pulse 71   Temp 97.8 F (36.6 C) (Oral)   Resp 20   Ht 5\' 1"  (1.549 m)   Wt 45 kg (99 lb 3.3 oz)   SpO2 98%   BMI 18.74 kg/m   Intake/Output Summary (Last 24 hours) at 03/23/2018 1203 Last data filed at 03/23/2018 1105 Gross per 24 hour  Intake 358 ml  Output 3000 ml  Net -2642 ml   Filed Weights   03/23/18 0445 03/23/18 0655 03/23/18 1105  Weight: 47.7 kg (105 lb 1.6 oz) 45.8 kg (100 lb 15.5 oz) 45 kg (99 lb 3.3 oz)    Exam: Patient is examined daily including today on 03/23/2018, exams remain the same as of yesterday except that has changed    General:  NAD, calm and alert, follow commands  Cardiovascular: RRR  Respiratory: CTABL  Abdomen: Soft/ND/NT, positive BS  Musculoskeletal: No Edema  Neuro: alert, not oriented to time  Data Reviewed: Basic Metabolic  Panel: Recent Labs  Lab 03/20/18 1830 03/21/18 0707 03/22/18 0855 03/23/18 0421  NA 139 137 140 138  K 3.0* 3.2* 3.8 3.7  CL 95* 94* 99* 94*  CO2 30 29 26 30   GLUCOSE 83 85 119* 101*  BUN 11 19 28* 14  CREATININE 4.90* 6.14* 7.66* 4.43*  CALCIUM 8.7* 8.5* 8.9 9.2  PHOS  --   --  4.8*  --    Liver Function Tests: Recent Labs  Lab 03/20/18 1830 03/22/18 0855  AST 15  --   ALT 10*  --   ALKPHOS 93  --   BILITOT 0.7  --   PROT 6.8  --     ALBUMIN 3.4* 3.3*   No results for input(s): LIPASE, AMYLASE in the last 168 hours. No results for input(s): AMMONIA in the last 168 hours. CBC: Recent Labs  Lab 03/20/18 1830 03/22/18 1356 03/23/18 0421  WBC 7.2 4.5 5.5  NEUTROABS 4.7 2.4  --   HGB 12.6 10.1* 11.9*  HCT 38.9 31.7* 36.4  MCV 89.0 88.3 89.0  PLT 221 248 201   Cardiac Enzymes:   Recent Labs  Lab 03/21/18 0108 03/21/18 0707 03/21/18 1255  TROPONINI 2.42* 2.53* 2.00*   BNP (last 3 results) Recent Labs    03/20/18 1643  BNP 219.9*    ProBNP (last 3 results) No results for input(s): PROBNP in the last 8760 hours.  CBG: No results for input(s): GLUCAP in the last 168 hours.  Recent Results (from the past 240 hour(s))  MRSA PCR Screening     Status: None   Collection Time: 03/20/18 11:30 PM  Result Value Ref Range Status   MRSA by PCR NEGATIVE NEGATIVE Final    Comment:        The GeneXpert MRSA Assay (FDA approved for NASAL specimens only), is one component of a comprehensive MRSA colonization surveillance program. It is not intended to diagnose MRSA infection nor to guide or monitor treatment for MRSA infections. Performed at [facility], [place] [address], [place], [place] [address]      Studies: No results found.  Scheduled Meds: . amiodarone  400 mg Oral BID  . atorvastatin  40 mg Oral Daily  . calcitRIOL  0.25 mcg Oral Q T,Th,Sa-HD  . cinacalcet  60 mg Oral Q T,Th,Sa-HD  . levETIRAcetam  500 mg Oral BID  . multivitamin  1 tablet Oral Daily  . pantoprazole  40 mg Oral Daily  . tamsulosin  0.4 mg Oral Daily    Continuous Infusions: . [START ON 03/24/2018] ferric gluconate (FERRLECIT/NULECIT) IV       Time spent: 36mins, case discussed with nephrology, cardiology I have personally reviewed and interpreted on  03/23/2018 daily labs, tele strips, imagings as discussed above under date review session and assessment and plans.  I reviewed all nursing notes, pharmacy notes,  consultant notes,  vitals, pertinent old records  I have discussed plan of care as described above with RN , patient  on 03/23/2018   Florencia Reasons MD, PhD  [facility] Pager [phone]. If 7PM-7AM, please contact night-coverage at [email or web address], password [facility] 03/23/2018, 12:03 PM  LOS: 0 days

## 2018-03-23 NOTE — Progress Notes (Addendum)
Palliative Medicine RN Note: Rec'd phone calls from Dr Erlinda Hong and RN [facility] regarding the referral for this patient.  Due to very high call volume, we have had a delay seeing this patient. Our NP Ihor Dow spoke with Dr Erlinda Hong earlier today, who said there is no family at the bedside. Jinny Blossom also updated Dr Erlinda Hong on the patient's ineligibility for hospice based on continuing HD. I have also discussed this case with Dr Hilma Favors, who is aware that we are unable to see her today.  We will call the family if/when we have an available timeslot, likely tomorrow afternoon. If the patient is stable for discharge, recommend outpatient palliative care through [facility] or [facility] to continue [place] conversations.  Marjie Skiff Natallie Ravenscroft, RN, BSN, [facility] Palliative Medicine Team 03/23/2018 3:45 PM Office [phone]

## 2018-03-23 NOTE — Progress Notes (Signed)
Subjective: Interval History: has no complaint .  Objective: Vital signs in last 24 hours: Temp:  [97.5 F (36.4 C)-98.4 F (36.9 C)] 98.4 F (36.9 C) (06/04 0655) Pulse Rate:  [64-84] 73 (06/04 0830) Resp:  [14-21] 19 (06/04 0710) BP: (98-159)/(41-91) 124/62 (06/04 0830) SpO2:  [97 %-100 %] 97 % (06/04 0655) Weight:  [45.8 kg (100 lb 15.5 oz)-47.9 kg (105 lb 9.6 oz)] 45.8 kg (100 lb 15.5 oz) (06/04 0655) Weight change: -0.272 kg (-9.6 oz)  Intake/Output from previous day: 06/03 0701 - 06/04 0700 In: 598 [P.O.:598] Out: 2000  Intake/Output this shift: No intake/output data recorded.  General appearance: alert, cooperative and no distress Resp: diminished breath sounds bilaterally Cardio: S1, S2 normal and systolic murmur: systolic ejection 2/6, decrescendo at 2nd left intercostal space GI: soft, non-tender; bowel sounds normal; no masses,  no organomegaly Extremities: AVF RUA  Lab Results: Recent Labs    03/22/18 1356 03/23/18 0421  WBC 4.5 5.5  HGB 10.1* 11.9*  HCT 31.7* 36.4  PLT 248 201   BMET:  Recent Labs    03/22/18 0855 03/23/18 0421  NA 140 138  K 3.8 3.7  CL 99* 94*  CO2 26 30  GLUCOSE 119* 101*  BUN 28* 14  CREATININE 7.66* 4.43*  CALCIUM 8.9 9.2   No results for input(s): PTH in the last 72 hours. Iron Studies: No results for input(s): IRON, TIBC, TRANSFERRIN, FERRITIN in the last 72 hours.  Studies/Results: No results found.  I have reviewed the patient's current medications.  Assessment/Plan: 1 ESRD on sched HD.  2 HTN  Controlled 3 Aflutter.  In NSR.  4 Anemia esa 5 HPTH vit D 6 Dementia 7 Hx CNS bleed P HD, esa. Poor candidate for anticoag     LOS: 0 days   Jeneen Rinks Susie Ehresman 03/23/2018,8:37 AM

## 2018-03-23 NOTE — Procedures (Signed)
I was present at this session.  I have reviewed the session itself and made appropriate changes.  HD via RUA AVF.  bp tol HD.  For some reason flows low, will Jeneen Rinks Marthella Osorno 6/4/20198:36 AM

## 2018-03-23 NOTE — Progress Notes (Signed)
  Echocardiogram 2D Echocardiogram has been performed.  [name] 03/23/2018, 3:22 PM

## 2018-03-24 DIAGNOSIS — Z515 Encounter for palliative care: Secondary | ICD-10-CM

## 2018-03-24 DIAGNOSIS — Z7189 Other specified counseling: Secondary | ICD-10-CM

## 2018-03-24 DIAGNOSIS — I4891 Unspecified atrial fibrillation: Secondary | ICD-10-CM | POA: Diagnosis not present

## 2018-03-24 DIAGNOSIS — I5032 Chronic diastolic (congestive) heart failure: Secondary | ICD-10-CM | POA: Diagnosis not present

## 2018-03-24 DIAGNOSIS — N186 End stage renal disease: Secondary | ICD-10-CM | POA: Diagnosis not present

## 2018-03-24 DIAGNOSIS — Z8679 Personal history of other diseases of the circulatory system: Secondary | ICD-10-CM | POA: Diagnosis not present

## 2018-03-24 LAB — BASIC METABOLIC PANEL
Anion gap: 11 (ref 5–15)
BUN: 11 mg/dL (ref 6–20)
CALCIUM: 9.3 mg/dL (ref 8.9–10.3)
CO2: 31 mmol/L (ref 22–32)
CREATININE: 4.5 mg/dL — AB (ref 0.44–1.00)
Chloride: 95 mmol/L — ABNORMAL LOW (ref 101–111)
GFR calc non Af Amer: 10 mL/min — ABNORMAL LOW (ref >60)
GFR, EST AFRICAN AMERICAN: 12 mL/min — AB (ref >60)
Glucose, Bld: 113 mg/dL — ABNORMAL HIGH (ref 65–99)
Potassium: 4.8 mmol/L (ref 3.5–5.1)
Sodium: 137 mmol/L (ref 135–145)

## 2018-03-24 LAB — CBC
HCT: 40.3 % (ref 36.0–46.0)
Hemoglobin: 12.6 g/dL (ref 12.0–15.0)
MCH: 28.5 pg (ref 26.0–34.0)
MCHC: 31.3 g/dL (ref 30.0–36.0)
MCV: 91.2 fL (ref 78.0–100.0)
Platelets: 170 10*3/uL (ref 150–400)
RBC: 4.42 MIL/uL (ref 3.87–5.11)
RDW: 13.2 % (ref 11.5–15.5)
WBC: 5.8 10*3/uL (ref 4.0–10.5)

## 2018-03-24 MED ORDER — CHLORHEXIDINE GLUCONATE CLOTH 2 % EX PADS: 6.0000 | MEDICATED_PAD | Freq: Every day | CUTANEOUS | Status: DC

## 2018-03-24 MED ORDER — AMIODARONE HCL 200 MG PO TABS: ORAL_TABLET | ORAL | 1 refills | Status: DC

## 2018-03-24 NOTE — Care Management Obs Status (Signed)
[facility] NOTIFICATION   Patient Details  Name: [name] MRN: [id number] Date of Birth: 11/10/1960   Medicare Observation Status Notification Given:  Yes    Bethena Roys, RN 03/24/2018, 11:58 AM

## 2018-03-24 NOTE — Discharge Summary (Signed)
Physician Discharge Summary   Patient ID: Markeia Harkless MRN: [id number] DOB/AGE: Oct 14, 1961 57 y.o.  Admit date: 03/20/2018 Discharge date: 03/24/2018  Primary Care Physician:  Garwin Brothers, MD   Recommendations for Outpatient Follow-up:  1. Follow up with PCP in 1-2 weeks  Home Health: None  Equipment/Devices: none   Discharge Condition: stable  CODE STATUS: FULL  Diet recommendation: Renal diet   Discharge Diagnoses:    . Atrial fibrillation with RVR ([place]), new onset paroxysmal . Chronic diastolic heart failure ([place]) . ESRD (end stage renal disease) ([place]) . HTN (hypertension), malignant    elevated troponin History of seizures History of brain aneurysm status post SAH status post clipping and VP shunt in 2013  Consults: Nephrology Cardiology    Allergies:   Allergies  Allergen Reactions  . Ampicillin Hives and Rash    Has patient had a PCN reaction causing immediate rash, facial/tongue/throat swelling, SOB or lightheadedness with hypotension: No Has patient had a PCN reaction causing severe rash involving mucus membranes or skin necrosis: No Has patient had a PCN reaction that required hospitalization: No Has patient had a PCN reaction occurring within the last 10 years: No If all of the above answers are "NO", then may proceed with Cephalosporin use.   Marland Kitchen Penicillins Hives and Rash    Has patient had a PCN reaction causing immediate rash, facial/tongue/throat swelling, SOB or lightheadedness with hypotension: Yes Has patient had a PCN reaction causing severe rash involving mucus membranes or skin necrosis: No Has patient had a PCN reaction that required hospitalization: No Has patient had a PCN reaction occurring within the last 10 years: No If all of the above answers are "NO", then may proceed with Cephalosporin use.      DISCHARGE MEDICATIONS: Allergies as of 03/24/2018      Reactions   Ampicillin Hives, Rash   Has patient had a PCN reaction causing immediate  rash, facial/tongue/throat swelling, SOB or lightheadedness with hypotension: No Has patient had a PCN reaction causing severe rash involving mucus membranes or skin necrosis: No Has patient had a PCN reaction that required hospitalization: No Has patient had a PCN reaction occurring within the last 10 years: No If all of the above answers are "NO", then may proceed with Cephalosporin use.   Penicillins Hives, Rash   Has patient had a PCN reaction causing immediate rash, facial/tongue/throat swelling, SOB or lightheadedness with hypotension: Yes Has patient had a PCN reaction causing severe rash involving mucus membranes or skin necrosis: No Has patient had a PCN reaction that required hospitalization: No Has patient had a PCN reaction occurring within the last 10 years: No If all of the above answers are "NO", then may proceed with Cephalosporin use.      Medication List    STOP taking these medications   amLODipine 10 MG tablet Commonly known as:  NORVASC   isosorbide mononitrate 30 MG 24 hr tablet Commonly known as:  IMDUR   labetalol 200 MG tablet Commonly known as:  NORMODYNE     TAKE these medications   amiodarone 200 MG tablet Commonly known as:  PACERONE Take 2 tabs (400mg ) daily for 7 days, then 1 tab (200mg ) daily thereafter   atorvastatin 40 MG tablet Commonly known as:  LIPITOR Take 40 mg by mouth daily.   lactulose 10 GM/15ML solution Commonly known as:  CHRONULAC Take 10 g by mouth daily as needed for moderate constipation.   levETIRAcetam 500 MG tablet Commonly known as:  KEPPRA Take  500 mg by mouth 2 (two) times daily.   lidocaine-prilocaine cream Commonly known as:  EMLA Apply 1 application topically 3 (three) times daily. Apply prior to Dialysys   multivitamin Tabs tablet Take 1 tablet by mouth daily.   nitroGLYCERIN 0.4 MG SL tablet Commonly known as:  NITROSTAT Place 1 tablet (0.4 mg total) under the tongue every 5 (five) minutes as needed for  chest pain.   omeprazole 20 MG capsule Commonly known as:  PRILOSEC Take 20 mg by mouth daily.   tamsulosin 0.4 MG Caps capsule Commonly known as:  FLOMAX Take 0.4 mg by mouth daily.        Brief H and P: For complete details please refer to admission H and P, but in brief Leanette Eutsler is a 57 y.o. female with medical history significant of ESRD dialysis TTS, SAH due to aneurysm clipped in 2013, also had another aneurysm coiled, and a 3rd aneurysm that was too small at the time to do anything with, embolic MCA CVA in 0923.  Loop recorder implant at that time, unknown source at that time.  HTN.  Patient presented to ED with hypotension and tachycardia onset 1.5h into a 4h dialysis session. In ED, Patient spontaneously converted out of A. fib back to NSR, resolution of ST depressions and BP normalized.  Hospital Course:   New onset atrial fibrillation with RVR during HD, paroxysmal -Patient has converted to normal sinus rhythm and has maintained a rhythm.  -Cardiology was consulted, recommended amiodarone 400 mg for 1 week then continue 200 mg daily -No anticoagulation given history of bleeds.  Elevated troponin Cardiology was consulted, recommended no further intervention.  2D echo showed normal systolic function with grade 1 diastolic dysfunction.  ESRD on hemodialysis Nephrology was consulted and patient underwent hemodialysis per her schedule TTS   Hypertension At the time of admission, patient's BP was low, hence antihypertensives were held Continue to hold Norvasc, labetalol, Imdur, BP stable  Prior history of left MCA CVA in 2015, brain aneurysm status post SAH, clipping and VP shunt in 2013 -No acute issues  Day of Discharge S: Heart rate controlled, hoping to go home today, no chest pain or shortness of breath.  BP 116/74 (BP Location: Left Arm)   Pulse 72   Temp 97.9 F (36.6 C) (Oral)   Resp 16   Ht 5\' 1"  (1.549 m)   Wt 44.9 kg (98 lb 15.8 oz)   SpO2 100%    BMI 18.70 kg/m   Physical Exam: General: Alert and awake oriented x3 not in any acute distress. HEENT: anicteric sclera, pupils reactive to light and accommodation CVS: S1-S2 clear, regular rate and rhythm Chest: clear to auscultation bilaterally, no wheezing rales or rhonchi Abdomen: soft nontender, nondistended, normal bowel sounds Extremities: no cyanosis, clubbing or edema noted bilaterally Neuro: Cranial nerves II-XII intact, no focal neurological deficits   The results of significant diagnostics from this hospitalization (including imaging, microbiology, ancillary and laboratory) are listed below for reference.      Procedures/Studies: Echo  Study Conclusions  - Left ventricle: The cavity size was normal. There was moderate   concentric hypertrophy. Systolic function was normal. The   estimated ejection fraction was in the range of 60% to 65%. Wall   motion was normal; there were no regional wall motion   abnormalities. There was an increased relative contribution of   atrial contraction to ventricular filling. Doppler parameters are   consistent with abnormal left ventricular relaxation (grade 1  diastolic dysfunction). - Aortic valve: Moderate focal calcification involving the left   coronary cusp. - Pulmonary arteries: Systolic pressure could not be accurately   estimated. - Pericardium, extracardiac: A small, free-flowing pericardial   effusion was identified circumferential to the heart. The fluid   had no internal echoes.There was no evidence of hemodynamic   compromise    Dg Chest Portable 1 View  Result Date: 03/20/2018 CLINICAL DATA:  Hypotension after dialysis. EXAM: PORTABLE CHEST 1 VIEW COMPARISON:  February 16, 2018 FINDINGS: The VP shunt catheter begins in terminates outside of the field of view. The heart, hila, mediastinum, lungs, and pleura are unremarkable. IMPRESSION: No active disease. Electronically Signed   By: Dorise Bullion III M.D   On:  03/20/2018 15:57       LAB RESULTS: Basic Metabolic Panel: Recent Labs  Lab 03/22/18 0855 03/23/18 0421 03/24/18 0451  NA 140 138 137  K 3.8 3.7 4.8  CL 99* 94* 95*  CO2 26 30 31   GLUCOSE 119* 101* 113*  BUN 28* 14 11  CREATININE 7.66* 4.43* 4.50*  CALCIUM 8.9 9.2 9.3  PHOS 4.8*  --   --    Liver Function Tests: Recent Labs  Lab 03/20/18 1830 03/22/18 0855  AST 15  --   ALT 10*  --   ALKPHOS 93  --   BILITOT 0.7  --   PROT 6.8  --   ALBUMIN 3.4* 3.3*   No results for input(s): LIPASE, AMYLASE in the last 168 hours. No results for input(s): AMMONIA in the last 168 hours. CBC: Recent Labs  Lab 03/22/18 1356 03/23/18 0421 03/24/18 0451  WBC 4.5 5.5 5.8  NEUTROABS 2.4  --   --   HGB 10.1* 11.9* 12.6  HCT 31.7* 36.4 40.3  MCV 88.3 89.0 91.2  PLT 248 201 170   Cardiac Enzymes: Recent Labs  Lab 03/21/18 0707 03/21/18 1255  TROPONINI 2.53* 2.00*   BNP: Invalid input(s): POCBNP CBG: No results for input(s): GLUCAP in the last 168 hours.    Disposition and Follow-up: Discharge Instructions    Diet - low sodium heart healthy   Complete by:  As directed    Renal dialysis diet   Diet - low sodium heart healthy   Complete by:  As directed    Increase activity slowly   Complete by:  As directed    Increase activity slowly   Complete by:  As directed        DISPOSITION: Home   DISCHARGE FOLLOW-UP Follow-up Information    Troy Sine, MD. Schedule an appointment as soon as possible for a visit in 2 week(s).   Specialty:  Cardiology Contact information: [address] [place] [address] [phone]        Garwin Brothers, MD. Schedule an appointment as soon as possible for a visit in 2 week(s).   Specialty:  Internal Medicine Contact information: [address] [place] [address] [place], [place] Follow up.   Specialty:  Home Health Services Why:  Physical Therapy Contact  information: [place] [address] [phone]            Time coordinating discharge:  73mins   Signed:   Estill Cotta M.D. [facility] 03/24/2018, 12:00 PM Pager: [phone]

## 2018-03-24 NOTE — Progress Notes (Addendum)
Physical Therapy Treatment Patient Details Name: [name] MRN: [id number] DOB: [birth year] Today's Date: 03/24/2018    History of Present Illness 57 y.o. 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    PT Comments    Pt admitted with above diagnosis. Pt currently with functional limitations due to endurance and balance deficits. Pt was able to ambulate but did score 15/24 on DGI suggesting need for A device.  Pt aware of recommendation to use device but has refused the device according to CM note.   Pt will benefit from skilled PT to increase their independence and safety with mobility to allow discharge to the venue listed below.     Follow Up Recommendations  Home health PT     Equipment Recommendations  Rolling walker with 5" wheels    Recommendations for Other Services       Precautions / Restrictions Precautions Precautions: Fall Restrictions Weight Bearing Restrictions: No    Mobility  Bed Mobility Overal bed mobility: Independent                Transfers Overall transfer level: Needs assistance Equipment used: None Transfers: Sit to/from Stand Sit to Stand: Min guard         General transfer comment: Pt was able to perform sit to stand with steadying assist due to pt slightly flexed and unsure on feet.    Ambulation/Gait Ambulation/Gait assistance: Min assist;Min guard Ambulation Distance (Feet): 250 Feet Assistive device: None Gait Pattern/deviations: Step-through pattern;Decreased stride length;Trunk flexed   Gait velocity interpretation: <1.31 ft/sec, indicative of household ambulator General Gait Details: Pt was able to ambulate with min guard assist as pt slightly unsteady  at times needing steadying assist without use of device.  Pt would benefit from RW and she agrees she would use it.     Stairs             Wheelchair Mobility    Modified Rankin (Stroke Patients Only)       Balance Overall balance assessment: Needs assistance Sitting-balance support: No upper extremity supported;Feet supported Sitting balance-Leahy Scale: Fair     Standing balance support: Bilateral upper extremity supported;During functional activity Standing balance-Leahy Scale: Poor Standing balance comment: relies on UE support                 Standardized Balance Assessment Standardized Balance Assessment : Dynamic Gait Index   Dynamic Gait Index Level Surface: Mild Impairment Change in Gait Speed: Moderate Impairment Gait with Horizontal Head Turns: Mild Impairment Gait with Vertical Head Turns: Normal Gait and Pivot Turn: Mild Impairment Step Over Obstacle: Moderate Impairment Step Around Obstacles: Normal Steps: Moderate Impairment Total Score: 15      Cognition Arousal/Alertness: Awake/alert Behavior During Therapy: Flat affect Overall Cognitive Status: Impaired/Different from baseline Area of Impairment: Orientation;Following commands;Safety/judgement;Memory;Awareness;Problem solving                 Orientation Level: Disoriented to;Time;Situation;Place   Memory: Decreased short-term memory Following Commands: Follows one step commands with increased time Safety/Judgement: Decreased awareness of safety;Decreased awareness of deficits   Problem Solving: Decreased initiation;Requires tactile cues        Exercises  General Comments General comments (skin integrity, edema, etc.): Scored 15/24 on DGI suggesting pt at risk for falls without device. Pt agrees to use RW.       Pertinent Vitals/Pain Pain Assessment: No/denies pain    Home Living                      Prior Function            PT Goals (current  goals can now be found in the care plan section) Acute Rehab PT Goals Patient Stated Goal: to go home Progress towards PT goals: Progressing toward goals    Frequency    Min 3X/week      PT Plan Current plan remains appropriate    Co-evaluation              AM-PAC PT "6 Clicks" Daily Activity  Outcome Measure  Difficulty turning over in bed (including adjusting bedclothes, sheets and blankets)?: None Difficulty moving from lying on back to sitting on the side of the bed? : None Difficulty sitting down on and standing up from a chair with arms (e.g., wheelchair, bedside commode, etc,.)?: A Little Help needed moving to and from a bed to chair (including a wheelchair)?: A Little Help needed walking in hospital room?: A Little Help needed climbing 3-5 steps with a railing? : A Lot 6 Click Score: 19    End of Session Equipment Utilized During Treatment: Gait belt Activity Tolerance: Patient tolerated treatment well Patient left: with call bell/phone within reach;in bed;with bed alarm set Nurse Communication: Mobility status PT Visit Diagnosis: Unsteadiness on feet (R26.81);Muscle weakness (generalized) (M62.81)     Time: 1216-1226 PT Time Calculation (min) (ACUTE ONLY): 10 min  Charges:  $Gait Training: 8-22 mins                    G Codes:       Laure Leone,PT Acute Rehabilitation [phone] (pager)    Denice Paradise 03/24/2018, 1:18 PM

## 2018-03-24 NOTE — Progress Notes (Addendum)
Progress Note  Patient Name: [name] Date of Encounter: 03/24/2018  Primary Cardiologist: Shelva Majestic, MD , previously [facility] and [facility]  Subjective   Pt is ready for discharge. Seems confused about when her normal HD days are.  Inpatient Medications    Scheduled Meds: . amiodarone  400 mg Oral Daily  . atorvastatin  40 mg Oral Daily  . calcitRIOL  0.25 mcg Oral Q T,Th,Sa-HD  . cinacalcet  60 mg Oral Q T,Th,Sa-HD  . levETIRAcetam  500 mg Oral BID  . multivitamin  1 tablet Oral Daily  . pantoprazole  40 mg Oral Daily  . tamsulosin  0.4 mg Oral Daily   Continuous Infusions: . ferric gluconate (FERRLECIT/NULECIT) IV     PRN Meds: acetaminophen, lactulose, lidocaine-prilocaine, ondansetron (ZOFRAN) IV   Vital Signs    Vitals:   03/23/18 1830 03/23/18 2057 03/23/18 2212 03/24/18 0500  BP: 130/83 115/61  116/74  Pulse: 78 70  72  Resp:  17 16 16   Temp: 98.6 F (37 C) 98.3 F (36.8 C)  97.9 F (36.6 C)  TempSrc: Oral Oral  Oral  SpO2: 100% 98%  100%  Weight:    98 lb 15.8 oz (44.9 kg)  Height:        Intake/Output Summary (Last 24 hours) at 03/24/2018 0933 Last data filed at 03/24/2018 0924 Gross per 24 hour  Intake 342 ml  Output 1000 ml  Net -658 ml   Filed Weights   03/23/18 0655 03/23/18 1105 03/24/18 0500  Weight: 100 lb 15.5 oz (45.8 kg) 99 lb 3.3 oz (45 kg) 98 lb 15.8 oz (44.9 kg)    Telemetry    sinus - Personally Reviewed  ECG    No new tracings - Personally Reviewed  Physical Exam   GEN: No acute distress.   Neck: No JVD Cardiac: RRR + murmur Respiratory: Clear to auscultation bilaterally, but diminished  GI: Soft, nontender, non-distended  MS: No edema; No deformity. Neuro:  Nonfocal  Psych: Normal affect   Labs    Chemistry Recent Labs  Lab 03/20/18 1830  03/22/18 0855 03/23/18 0421 03/24/18 0451  NA 139   < > 140 138 137  K 3.0*   < > 3.8 3.7 4.8  CL 95*   < > 99* 94* 95*  CO2 30   < > 26 30 31   GLUCOSE 83   < >  119* 101* 113*  BUN 11   < > 28* 14 11  CREATININE 4.90*   < > 7.66* 4.43* 4.50*  CALCIUM 8.7*   < > 8.9 9.2 9.3  PROT 6.8  --   --   --   --   ALBUMIN 3.4*  --  3.3*  --   --   AST 15  --   --   --   --   ALT 10*  --   --   --   --   ALKPHOS 93  --   --   --   --   BILITOT 0.7  --   --   --   --   GFRNONAA 9*   < > 5* 10* 10*  GFRAA 10*   < > 6* 12* 12*  ANIONGAP 14   < > 15 14 11    < > = values in this interval not displayed.     Hematology Recent Labs  Lab 03/22/18 1356 03/23/18 0421 03/24/18 0451  WBC 4.5 5.5 5.8  RBC 3.59* 4.09 4.42  HGB 10.1* 11.9* 12.6  HCT 31.7* 36.4 40.3  MCV 88.3 89.0 91.2  MCH 28.1 29.1 28.5  MCHC 31.9 32.7 31.3  RDW 12.9 13.2 13.2  PLT 248 201 170    Cardiac Enzymes Recent Labs  Lab 03/21/18 0108 03/21/18 0707 03/21/18 1255  TROPONINI 2.42* 2.53* 2.00*    Recent Labs  Lab 03/20/18 1836  TROPIPOC 0.48*     BNP Recent Labs  Lab 03/20/18 1643  BNP 219.9*     DDimer No results for input(s): DDIMER in the last 168 hours.   Radiology    No results found.  Cardiac Studies   Echo 03/23/18: Study Conclusions - Left ventricle: The cavity size was normal. There was moderate   concentric hypertrophy. Systolic function was normal. The   estimated ejection fraction was in the range of 60% to 65%. Wall   motion was normal; there were no regional wall motion   abnormalities. There was an increased relative contribution of   atrial contraction to ventricular filling. Doppler parameters are   consistent with abnormal left ventricular relaxation (grade 1   diastolic dysfunction). - Aortic valve: Moderate focal calcification involving the left   coronary cusp. - Pulmonary arteries: Systolic pressure could not be accurately   estimated. - Pericardium, extracardiac: A small, free-flowing pericardial   effusion was identified circumferential to the heart. The fluid   had no internal echoes.There was no evidence of hemodynamic    compromise.  Patient Profile     57 y.o. 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  Assessment & Plan    1. New onset Afib RVR during HD - pt since converted to NSR and maintained this rhythm - continue amiodarone 400 mg daily x 6 days, then 200 mg daily - no anticoagulation, not a candidate given significant history of bleeds  2. Elevated troponin - no ASA from a cardiology standpoint - no intervention - echo with normal function and grade 1 DD  3. ESRD on HD - per  Nephrology - pt states she is unsure of when her regular HD days are - asked her to clarify this prior to discharge  4. HLD - last LDL 57 on 10/2013 - will not repeat at this time   For questions or updates, please contact [address] Please consult [email or web address] for contact info under Cardiology/STEMI.      Signed, Tami Lin Duke, PA  03/24/2018, 9:33 AM    Patient seen and examined. Agree with assessment and plan. Maintaining NSR; Tolerating amiodarone and in one week decreaase to 200 MG daily. For palliative care. Risk outweighs benefit; therefore no anticoagulation. Mild troponin probable demand ischemia from AFwith RVR.   Troy Sine, MD, [facility] 03/24/2018 11:11 AM

## 2018-03-24 NOTE — Progress Notes (Signed)
No charge note   PMT meeting scheduled with daughter [name] today at 11:00 (6/5)  Florentina Jenny, PA-C Palliative Medicine Pager: [phone]

## 2018-03-24 NOTE — Progress Notes (Addendum)
[facility] Progress Note   Dialysis Orders: [facility] TTS EDW 47 2 K 2 Ca 4 hr AVF right upper heparin none sensipar 50 TIW venofer 50 per week and mircera 30 q 2 weeks- last dose 5/30; has not been on calcitriol since starting HD (admission note at her first outpatient HD tmt states calcitriol had been stopped-not clear why) Recent labs:  hgb 9.6 trending down on Mircera 30 q 2 weeks iPTH 619 -, K upper 3s past two months   Assessment/Plan: 1. Afib with RVR-onset during HD the day of admission. Spontaneous conversion to NSR - on amio load, not anticoagulation candidate 2. ESRD - TTS - HD orders written for Thursday if not d/c today - avoid low K 3. Anemia - hgb 1.26- holding ESA 4. Secondary hyperparathyroidism - Ca borderline - P ok low dose calcitriol resumed this admission - on  Sensipar 60 w HD 5. HTN/volume - net UF 2 L Monday and 1 L Tuesday with post wt Tuesday of 45 6. Nutrition - alb 3.3 7. Hx SAH, embolic CVA - lives with daughter - has sig cognitive dysfunction; daughter is very supportive with care 8. Disp - ok per renal for d/c after goals of care conference  Myriam Jacobson, PA-C [facility] [phone] 03/24/2018,9:50 AM  LOS: 0 days  I have seen and examined this patient and agree with the plan of care seen, eval, examined, discussed with PA .  Jeneen Rinks Kelisha Dall 03/24/2018, 1:08 PM   Subjective:   No c/o - hopes to go home  Objective Vitals:   03/23/18 1830 03/23/18 2057 03/23/18 2212 03/24/18 0500  BP: 130/83 115/61  116/74  Pulse: 78 70  72  Resp:  17 16 16   Temp: 98.6 F (37 C) 98.3 F (36.8 C)  97.9 F (36.6 C)  TempSrc: Oral Oral  Oral  SpO2: 100% 98%  100%  Weight:    44.9 kg (98 lb 15.8 oz)  Height:       Physical Exam General: thin female NAD watching TV Heart: RRR Lungs: no rales Abdomen:active BS soft NT Extremities: no LE edema Dialysis Access: right upper AVF + bruit   Additional Objective Labs: Basic  Metabolic Panel: Recent Labs  Lab 03/22/18 0855 03/23/18 0421 03/24/18 0451  NA 140 138 137  K 3.8 3.7 4.8  CL 99* 94* 95*  CO2 26 30 31   GLUCOSE 119* 101* 113*  BUN 28* 14 11  CREATININE 7.66* 4.43* 4.50*  CALCIUM 8.9 9.2 9.3  PHOS 4.8*  --   --    Liver Function Tests: Recent Labs  Lab 03/20/18 1830 03/22/18 0855  AST 15  --   ALT 10*  --   ALKPHOS 93  --   BILITOT 0.7  --   PROT 6.8  --   ALBUMIN 3.4* 3.3*   CBC: Recent Labs  Lab 03/20/18 1830 03/22/18 1356 03/23/18 0421 03/24/18 0451  WBC 7.2 4.5 5.5 5.8  NEUTROABS 4.7 2.4  --   --   HGB 12.6 10.1* 11.9* 12.6  HCT 38.9 31.7* 36.4 40.3  MCV 89.0 88.3 89.0 91.2  PLT 221 248 201 170   Blood Culture    Component Value Date/Time   SDES BLOOD LEFT HAND 11/08/2013 2338   SPECREQUEST BOTTLES DRAWN AEROBIC AND ANAEROBIC 10CC EACH 11/08/2013 2338   CULT  11/08/2013 2338    NO GROWTH 5 DAYS Performed at [place] 11/15/2013 FINAL 11/08/2013 2338  Cardiac Enzymes: Recent Labs  Lab 03/21/18 0108 03/21/18 0707 03/21/18 1255  TROPONINI 2.42* 2.53* 2.00*   CBG: No results for input(s): GLUCAP in the last 168 hours. Iron Studies: No results for input(s): IRON, TIBC, TRANSFERRIN, FERRITIN in the last 72 hours. Lab Results  Component Value Date   INR 1.48 09/25/2013   INR 0.99 05/18/2013   INR 0.98 05/17/2013   Studies/Results: No results found. Medications: . ferric gluconate (FERRLECIT/NULECIT) IV     . amiodarone  400 mg Oral Daily  . atorvastatin  40 mg Oral Daily  . calcitRIOL  0.25 mcg Oral Q T,Th,Sa-HD  . cinacalcet  60 mg Oral Q T,Th,Sa-HD  . levETIRAcetam  500 mg Oral BID  . multivitamin  1 tablet Oral Daily  . pantoprazole  40 mg Oral Daily  . tamsulosin  0.4 mg Oral Daily

## 2018-03-24 NOTE — Consult Note (Signed)
Consultation Note Date: 03/24/2018   Patient Name: [name]  DOB: March 21, 1961  MRN: [id number]  Age / Sex: 57 y.o., female  PCP: Garwin Brothers, MD Referring Physician: Mendel Corning, MD  Reason for Consultation: Establishing goals of care  HPI/Patient Profile: 57 y.o. female with past medical history of ESRD, brain aneurysms (4) and a sub arachnoid hemorrhage who was admitted on 03/20/2018 with new onset atrial fibrillation.  She has been treated with amiodarone and is now back in regular rhythm, but there is still concern as she is not a candidate for anticoagulation due to hx of SAH.   Clinical Assessment and Goals of Care:  I have reviewed medical records including EPIC notes, labs and imaging, received report from the bedside RN, assessed the patient and then met at the bedside along with her two daughters [name] and [name] to discuss diagnosis prognosis, GOC, EOL wishes, disposition and options.  I introduced Palliative Medicine as specialized medical care for people living with serious illness. It focuses on providing relief from the symptoms and stress of a serious illness. The goal is to improve quality of life for both the patient and the family.  We discussed a brief life review of the patient. [name] currently lives at home with [name], her husband and 5 children.  It sounds as though she has a good quality of life and is able to enjoy her grandchildren every day.  Her 87 year old grand daughter sleeps with her.  [name] carefully measures her fluid intake to ensure that she is compliant with dialysis requirements.  Despite having 5 children [name] transports her mother to and from dialysis.  She is very concerned about her mothers well being.  As far as functional and nutritional status, [name] is able to ambulate and perform her ADLs independently.  She does have confusion.  This has existed since  her [place] in 2013.  However, since starting HD, [name] mental status has improved - she has become more alert, smiling and vocal.  We discussed her current illness (afib) and what it means in the larger context of her on-going co-morbidities.  Specifically we discussed afib being a significant stroke risk normally requiring anticoagulation to reduce the risk of stroke.  In Ms. Finken's case it is too risky to give anticoagulation for fear of possible brain bleed.  Consequently she remains at high risk of stroke.  We discussed the heavy wear hemodialysis places on the heart.  This conversation brought [name] to tears and we took a break to allow her to compose herself.  I attempted to elicit values and goals of care important to the patient and we discussed code status.  [name] herself made it very clear to me that should she need someone to make medical decisions for her she wanted it to be her daughters - both of them.  I presented a hypothetical emergent scenario to [name] and [name] in which their mother has a significant stroke.  I asked them to consider whether  or not their mother would want to be intubated and placed on life support.  I asked them to consider this now - while she is happy and comfortable and they are not in an emergent situation.  The daughters implied that their mother would not want to be on life support.  They agreed to consider the question and potentially talk with her about it.   Questions and concerns were addressed.  The family was encouraged to call with questions or concerns.    Primary Decision Maker:  NEXT OF KIN  The patient's selected medical surrogate is her two daughters [name] and [place]).    SUMMARY OF RECOMMENDATIONS    D/C to [name]'s home with home health.   Social work order placed - is the patient eligible for transportation to and from hemodialysis?  Family concerned about the patient's insomnia and patient's weight loss.  Family education  provided.  Code status considered.   Family appreciative of consultation.  Code Status/Advance Care Planning:  Full   Psycho-social/Spiritual:   Desire for further Chaplaincy support:  No, patient is not religious, but daughter [name] is religious.  Prognosis:  Difficult to determine.  Patient is at high risk for acute decompensation given brain aneurysms, atrial fibrillation, unable to have anticoagulation, ESRD on HD.  Discharge Planning: Home with Home Health      Primary Diagnoses: Present on Admission: . Atrial fibrillation with RVR ([place]) . Chronic diastolic heart failure ([place]) . ESRD (end stage renal disease) ([place]) . HTN (hypertension), malignant   I have reviewed the medical record, interviewed the patient and family, and examined the patient. The following aspects are pertinent.  Past Medical History:  Diagnosis Date  . Anterior cerebral artery aneurysm    1996 AT [facility]  . Coronary atherosclerosis of native coronary artery    STEMI Feb 2009 - 70% distal LAD lesion c/w plaque rupture, DES distal RCA  . Diastolic heart failure    LVEF 60-65% with grade 2 diastolic dysfunction - July 2014  . ESRD (end stage renal disease) on dialysis ([place])   . HTN (hypertension)    Resistant  . Hypercholesterolemia   . Subarachnoid hemorrhage ([place])    03/2012  . Tobacco abuse    Resumed smoking half a pack a day. She was a smoker in the past and states she was abl eto stop in the past using nicotine patches   Social History   Socioeconomic History  . Marital status: Single    Spouse name: Not on file  . Number of children: Not on file  . Years of education: Not on file  . Highest education level: Not on file  Occupational History  . Not on file  Social Needs  . Financial resource strain: Not on file  . Food insecurity:    Worry: Not on file    Inability: Not on file  . Transportation needs:    Medical: Not on file    Non-medical: Not on file  Tobacco Use  . Smoking  status: Former Smoker    Packs/day: 0.20    Years: 35.00    Pack years: 7.00    Types: Cigarettes    Last attempt to quit: 03/20/2017    Years since quitting: 1.0  . Smokeless tobacco: Never Used  . Tobacco comment: 1/2 ppd   Substance and Sexual Activity  . Alcohol use: No  . Drug use: No  . Sexual activity: Never  Lifestyle  . Physical activity:    Days  per week: Not on file    Minutes per session: Not on file  . Stress: Not on file  Relationships  . Social connections:    Talks on phone: Not on file    Gets together: Not on file    Attends religious service: Not on file    Active member of club or organization: Not on file    Attends meetings of clubs or organizations: Not on file    Relationship status: Not on file  Other Topics Concern  . Not on file  Social History Narrative   Lives by herself but helps care for her 8 grandchildren.    Family History  Problem Relation Age of Onset  . Heart disease Mother   . Heart disease Father   . Coronary artery disease Other        Premature in 1st degree relatives   Scheduled Meds: . amiodarone  400 mg Oral Daily  . atorvastatin  40 mg Oral Daily  . calcitRIOL  0.25 mcg Oral Q T,Th,Sa-HD  . Chlorhexidine Gluconate Cloth  6 each Topical Q0600  . cinacalcet  60 mg Oral Q T,Th,Sa-HD  . levETIRAcetam  500 mg Oral BID  . multivitamin  1 tablet Oral Daily  . pantoprazole  40 mg Oral Daily  . tamsulosin  0.4 mg Oral Daily   Continuous Infusions: . ferric gluconate (FERRLECIT/NULECIT) IV     PRN Meds:.acetaminophen, lactulose, ondansetron (ZOFRAN) IV Allergies  Allergen Reactions  . Ampicillin Hives and Rash    Has patient had a PCN reaction causing immediate rash, facial/tongue/throat swelling, SOB or lightheadedness with hypotension: No Has patient had a PCN reaction causing severe rash involving mucus membranes or skin necrosis: No Has patient had a PCN reaction that required hospitalization: No Has patient had a PCN  reaction occurring within the last 10 years: No If all of the above answers are "NO", then may proceed with Cephalosporin use.   Marland Kitchen Penicillins Hives and Rash    Has patient had a PCN reaction causing immediate rash, facial/tongue/throat swelling, SOB or lightheadedness with hypotension: Yes Has patient had a PCN reaction causing severe rash involving mucus membranes or skin necrosis: No Has patient had a PCN reaction that required hospitalization: No Has patient had a PCN reaction occurring within the last 10 years: No If all of the above answers are "NO", then may proceed with Cephalosporin use.    Review of Systems patient with no complaints.  Physical Exam  Well developed thin pleasantly confused female, lying comfortably in bed. CV rrr resp no distress Abdomen thin, soft, nd, nt Ext able to move all 4.   Vital Signs: BP 116/74 (BP Location: Left Arm)   Pulse 72   Temp 97.9 F (36.6 C) (Oral)   Resp 16   Ht '5\' 1"'  (1.549 m)   Wt 44.9 kg (98 lb 15.8 oz)   SpO2 100%   BMI 18.70 kg/m  Pain Scale: 0-10   Pain Score: 0-No pain   SpO2: SpO2: 100 % O2 Device:SpO2: 100 % O2 Flow Rate: .   IO: Intake/output summary:   Intake/Output Summary (Last 24 hours) at 03/24/2018 1052 Last data filed at 03/24/2018 1000 Gross per 24 hour  Intake 702 ml  Output 1000 ml  Net -298 ml    LBM: Last BM Date: 03/19/18 Baseline Weight: Weight: 47.4 kg (104 lb 6.4 oz) Most recent weight: Weight: 44.9 kg (98 lb 15.8 oz)     Palliative Assessment/Data:60%  Time In: 11:00 Time Out: 11:50 Time Total: 50 min. Greater than 50%  of this time was spent counseling and coordinating care related to the above assessment and plan.  Signed by: Florentina Jenny, PA-C Palliative Medicine Pager: [phone]  Please contact Palliative Medicine Team phone at [phone] for questions and concerns.  For individual provider: See Shea Evans

## 2018-04-15 ENCOUNTER — Ambulatory Visit: Payer: Medicare Other | Admitting: Adult Health

## 2018-04-18 NOTE — Progress Notes (Deleted)
Cardiology Office Note   Date:  04/18/2018   ID:  [name], DOB [birth year], MRN [id number]  PCP:  Garwin Brothers, MD  Cardiologist:  No chief complaint on file.    History of Present Illness: [name] is a 57 y.o. female who presents for posthospitalization follow-up, after admission for new onset rapid atrial fibrillation and elevated troponin.  The patient has a history of end-stage renal disease on hemodialysis, hypertension, CAD with drug-eluting stent to the RCA, (medical treatment for 70% distal LAD and 50% distal circumflex), diastolic CHF, with other history to include ongoing tobacco abuse, ICH (intraparenchymal), aneurysm coiling x1 in 2013, VP shunt, history of embolic MCA/CVA in 0932.  The patient converted to normal sinus rhythm on amiodarone, was to stay on 400 mg daily for 6 days then reduce to 200 mg daily.  The patient was not placed on anticoagulation with significant history of bleeding.  She was also not placed on aspirin.  She was to continue neurology follow-up in the setting of CVA, and to continue hemodialysis as scheduled as an outpatient.  She was discharged on 03/24/2018.    Past Medical History:  Diagnosis Date  . Anterior cerebral artery aneurysm    1996 AT [facility]  . Coronary atherosclerosis of native coronary artery    STEMI Feb 2009 - 70% distal LAD lesion c/w plaque rupture, DES distal RCA  . Diastolic heart failure    LVEF 60-65% with grade 2 diastolic dysfunction - July 2014  . ESRD (end stage renal disease) on dialysis ([place])   . HTN (hypertension)    Resistant  . Hypercholesterolemia   . Subarachnoid hemorrhage ([place])    03/2012  . Tobacco abuse    Resumed smoking half a pack a day. She was a smoker in the past and states she was abl eto stop in the past using nicotine patches    Past Surgical History:  Procedure Laterality Date  . ABDOMINAL HYSTERECTOMY    . BASCILIC VEIN TRANSPOSITION Right 05/13/2013   Procedure: [place];   Surgeon: Rosetta Posner, MD;  Location: [place];  Service: Vascular;  Laterality: Right;  . BRAIN SURGERY     2   . LOOP RECORDER IMPLANT N/A 11/14/2013   Procedure: LOOP RECORDER IMPLANT;  Surgeon: Evans Lance, MD;  Location: [facility] CATH LAB;  Service: Cardiovascular;  Laterality: N/A;  . TEE WITHOUT CARDIOVERSION N/A 11/14/2013   Procedure: TRANSESOPHAGEAL ECHOCARDIOGRAM (TEE);  Surgeon: Candee Furbish, MD;  Location: [facility] ENDOSCOPY;  Service: Cardiovascular;  Laterality: N/A;  . VENTRICULOPERITONEAL SHUNT  03/27/2012   Procedure: SHUNT INSERTION VENTRICULAR-PERITONEAL;  Surgeon: Winfield Cunas, MD;  Location: [place] NEURO ORS;  Service: Neurosurgery;  Laterality: N/A;  Ventricular-Peritoneal Shunt Insertion     Current Outpatient Medications  Medication Sig Dispense Refill  . amiodarone (PACERONE) 200 MG tablet Take 2 tabs (400mg ) daily for 7 days, then 1 tab (200mg ) daily thereafter 60 tablet 1  . atorvastatin (LIPITOR) 40 MG tablet Take 40 mg by mouth daily.    Marland Kitchen lactulose (CHRONULAC) 10 GM/15ML solution Take 10 g by mouth daily as needed for moderate constipation.     . levETIRAcetam (KEPPRA) 500 MG tablet Take 500 mg by mouth 2 (two) times daily.    Marland Kitchen lidocaine-prilocaine (EMLA) cream Apply 1 application topically 3 (three) times daily. Apply prior to Dialysys  11  . multivitamin (RENA-VIT) TABS tablet Take 1 tablet by mouth daily.    . nitroGLYCERIN (NITROSTAT) 0.4 MG SL tablet Place  1 tablet (0.4 mg total) under the tongue every 5 (five) minutes as needed for chest pain. 20 tablet 0  . omeprazole (PRILOSEC) 20 MG capsule Take 20 mg by mouth daily.    . tamsulosin (FLOMAX) 0.4 MG CAPS capsule Take 0.4 mg by mouth daily.     No current facility-administered medications for this visit.     Allergies:   Ampicillin and Penicillins    Social History:  The patient  reports that she quit smoking about 12 months ago. Her smoking use included cigarettes. She has a 7.00 pack-year smoking history. She has  never used smokeless tobacco. She reports that she does not drink alcohol or use drugs.   Family History:  The patient's family history includes Coronary artery disease in her other; Heart disease in her father and mother.    ROS: All other systems are reviewed and negative. Unless otherwise mentioned in H&P    PHYSICAL EXAM: VS:  There were no vitals taken for this visit. , BMI There is no height or weight on file to calculate BMI. GEN: Well nourished, well developed, in no acute distress HEENT: normal Neck: no JVD, carotid bruits, or masses Cardiac: ***RRR; no murmurs, rubs, or gallops,no edema  Respiratory:  clear to auscultation bilaterally, normal work of breathing GI: soft, nontender, nondistended, + BS MS: no deformity or atrophy Skin: warm and dry, no rash Neuro:  Strength and sensation are intact Psych: euthymic mood, full affect   EKG:  EKG {ACTION; IS/IS [id number]} ordered today. The ekg ordered today demonstrates ***   Recent Labs: 03/20/2018: ALT 10; B Natriuretic Peptide 219.9 03/21/2018: TSH 0.554 03/24/2018: BUN 11; Creatinine, Ser 4.50; Hemoglobin 12.6; Platelets 170; Potassium 4.8; Sodium 137    Lipid Panel    Component Value Date/Time   CHOL 122 11/09/2013 0433   TRIG 92 11/09/2013 0433   HDL 47 11/09/2013 0433   CHOLHDL 2.6 11/09/2013 0433   VLDL 18 11/09/2013 0433   LDLCALC 57 11/09/2013 0433      Wt Readings from Last 3 Encounters:  03/24/18 98 lb 15.8 oz (44.9 kg)  03/01/14 114 lb (51.7 kg)  11/08/13 113 lb 11.2 oz (51.6 kg)      Other studies Reviewed: Additional studies/ records that were reviewed today include: ***. Review of the above records demonstrates: ***   ASSESSMENT AND PLAN:  1.  ***   Current medicines are reviewed at length with the patient today.    Labs/ tests ordered today include: *** Phill Myron. West Pugh, ANP, AACC   04/18/2018 10:33 AM    [facility] [address] [place],  [place] [address] Phone: [phone]; Fax: [phone]

## 2018-04-19 ENCOUNTER — Ambulatory Visit: Payer: Medicare Other | Admitting: Adult Health

## 2018-05-10 NOTE — Progress Notes (Deleted)
Cardiology Office Note   Date:  05/10/2018   ID:  Jadeyn Hargett, DOB Mar 05, 1961, MRN [id number]  PCP:  Garwin Brothers, MD  Cardiologist:  Dr. Claiborne Billings  No chief complaint on file.    History of Present Illness: [name] is a 57 y.o. female who presents for ongoing assessment and management of HTN, CAD with DES to RCA in 2009 (med tx for 70% mid LAd, 78% d Cx), diastolic CHF, with recent history of rapid atrial fib during hospitalization while undergoing dialysis for ESRD,  in 03/2018. . Other history of SAH,2nd aneurysm with clipping 2013, aneurysm coiling X1,embolic  MCA CVA 2956.   She was not found to be a candidate for anticoagulation due to history of bleeding. She was to be on amiodarone for 6 days and then decrease to 200 mg daily. Echocardiogram revealed normal LV function with Grade I diastolic dysfunction. She was discharged on 03/24/2018 and was back in NSR   Past Medical History:  Diagnosis Date  . Anterior cerebral artery aneurysm    1996 AT [facility]  . Coronary atherosclerosis of native coronary artery    STEMI Feb 2009 - 70% distal LAD lesion c/w plaque rupture, DES distal RCA  . Diastolic heart failure    LVEF 60-65% with grade 2 diastolic dysfunction - July 2014  . ESRD (end stage renal disease) on dialysis ([place])   . HTN (hypertension)    Resistant  . Hypercholesterolemia   . Subarachnoid hemorrhage ([place])    03/2012  . Tobacco abuse    Resumed smoking half a pack a day. She was a smoker in the past and states she was abl eto stop in the past using nicotine patches    Past Surgical History:  Procedure Laterality Date  . ABDOMINAL HYSTERECTOMY    . BASCILIC VEIN TRANSPOSITION Right 05/13/2013   Procedure: [place];  Surgeon: Rosetta Posner, MD;  Location: [place];  Service: Vascular;  Laterality: Right;  . BRAIN SURGERY     2   . LOOP RECORDER IMPLANT N/A 11/14/2013   Procedure: LOOP RECORDER IMPLANT;  Surgeon: Evans Lance, MD;  Location: [facility] CATH LAB;   Service: Cardiovascular;  Laterality: N/A;  . TEE WITHOUT CARDIOVERSION N/A 11/14/2013   Procedure: TRANSESOPHAGEAL ECHOCARDIOGRAM (TEE);  Surgeon: Candee Furbish, MD;  Location: [facility] ENDOSCOPY;  Service: Cardiovascular;  Laterality: N/A;  . VENTRICULOPERITONEAL SHUNT  03/27/2012   Procedure: SHUNT INSERTION VENTRICULAR-PERITONEAL;  Surgeon: Winfield Cunas, MD;  Location: [place] NEURO ORS;  Service: Neurosurgery;  Laterality: N/A;  Ventricular-Peritoneal Shunt Insertion     Current Outpatient Medications  Medication Sig Dispense Refill  . amiodarone (PACERONE) 200 MG tablet Take 2 tabs (400mg ) daily for 7 days, then 1 tab (200mg ) daily thereafter 60 tablet 1  . atorvastatin (LIPITOR) 40 MG tablet Take 40 mg by mouth daily.    Marland Kitchen lactulose (CHRONULAC) 10 GM/15ML solution Take 10 g by mouth daily as needed for moderate constipation.     . levETIRAcetam (KEPPRA) 500 MG tablet Take 500 mg by mouth 2 (two) times daily.    Marland Kitchen lidocaine-prilocaine (EMLA) cream Apply 1 application topically 3 (three) times daily. Apply prior to Dialysys  11  . multivitamin (RENA-VIT) TABS tablet Take 1 tablet by mouth daily.    . nitroGLYCERIN (NITROSTAT) 0.4 MG SL tablet Place 1 tablet (0.4 mg total) under the tongue every 5 (five) minutes as needed for chest pain. 20 tablet 0  . omeprazole (PRILOSEC) 20 MG capsule Take 20 mg  by mouth daily.    . tamsulosin (FLOMAX) 0.4 MG CAPS capsule Take 0.4 mg by mouth daily.     No current facility-administered medications for this visit.     Allergies:   Ampicillin and Penicillins    Social History:  The patient  reports that she quit smoking about 13 months ago. Her smoking use included cigarettes. She has a 7.00 pack-year smoking history. She has never used smokeless tobacco. She reports that she does not drink alcohol or use drugs.   Family History:  The patient's family history includes Coronary artery disease in her other; Heart disease in her father and mother.    ROS: All  other systems are reviewed and negative. Unless otherwise mentioned in H&P    PHYSICAL EXAM: VS:  There were no vitals taken for this visit. , BMI There is no height or weight on file to calculate BMI. GEN: Well nourished, well developed, in no acute distress  HEENT: normal  Neck: no JVD, carotid bruits, or masses Cardiac: ***RRR; no murmurs, rubs, or gallops,no edema  Respiratory:  clear to auscultation bilaterally, normal work of breathing GI: soft, nontender, nondistended, + BS MS: no deformity or atrophy  Skin: warm and dry, no rash Neuro:  Strength and sensation are intact Psych: euthymic mood, full affect   EKG:  EKG {ACTION; IS/IS [id number]:[id number]} ordered today. The ekg ordered today demonstrates ***   Recent Labs: 03/20/2018: ALT 10; B Natriuretic Peptide 219.9 03/21/2018: TSH 0.554 03/24/2018: BUN 11; Creatinine, Ser 4.50; Hemoglobin 12.6; Platelets 170; Potassium 4.8; Sodium 137    Lipid Panel    Component Value Date/Time   CHOL 122 11/09/2013 0433   TRIG 92 11/09/2013 0433   HDL 47 11/09/2013 0433   CHOLHDL 2.6 11/09/2013 0433   VLDL 18 11/09/2013 0433   LDLCALC 57 11/09/2013 0433      Wt Readings from Last 3 Encounters:  03/24/18 98 lb 15.8 oz (44.9 kg)  03/01/14 114 lb (51.7 kg)  11/08/13 113 lb 11.2 oz (51.6 kg)      Other studies Reviewed: Additional studies/ records that were reviewed today include: ***. Review of the above records demonstrates: ***   ASSESSMENT AND PLAN:  1.  ***   Current medicines are reviewed at length with the patient today.    Labs/ tests ordered today include: *** Phill Myron. West Pugh, ANP, AACC   05/10/2018 3:57 PM    [facility] [address] [place], [place] [address] Phone: [phone]; Fax: [phone]

## 2018-05-12 ENCOUNTER — Ambulatory Visit: Payer: Medicare Other | Admitting: Adult Health

## 2018-06-01 NOTE — Progress Notes (Deleted)
Cardiology Office Note   Date:  06/01/2018   ID:  [name], DOB [birth year], MRN [id number]  PCP:  Diane Brothers, MD  Cardiologist:  Dr [name]  No chief complaint on file. (EKG)    History of Present Illness: [name] is a 57 y.o. female who presents for ongoing assessment and management of CAD with hx of STEMI in 2009, DES to the RCA, hx of CVA of the MCA 2015, hypertension, diastolic CHF, rapid atrial fib, with other history of SAH with 2nd aneurysm with clipping in 2013, aneurysm coiling x 1 , VP shunt, hx of seizures, ESRD on dialysis.   Was seen on consultation on 03/21/2018 by Dr. Bronson [name] in the setting of atrial fib with RVR. The patient converted to NSR on amiodarone loading and transitioned to po. She was not a candidate for anticoagulation or ASA in the setting of SAH.     Past Medical History:  Diagnosis Date  . Anterior cerebral artery aneurysm    1996 AT [facility]  . Coronary atherosclerosis of native coronary artery    STEMI Feb 2009 - 70% distal LAD lesion c/w plaque rupture, DES distal RCA  . Diastolic heart failure    LVEF 60-65% with grade 2 diastolic dysfunction - July 2014  . ESRD (end stage renal disease) on dialysis ([place])   . HTN (hypertension)    Resistant  . Hypercholesterolemia   . Subarachnoid hemorrhage ([name])    03/2012  . Tobacco abuse    Resumed smoking half a pack a day. She was a smoker in the past and states she was abl eto stop in the past using nicotine patches    Past Surgical History:  Procedure Laterality Date  . ABDOMINAL HYSTERECTOMY    . BASCILIC VEIN TRANSPOSITION Right 05/13/2013   Procedure: [place];  Surgeon: Diane Posner, MD;  Location: [place];  Service: Vascular;  Laterality: Right;  . BRAIN SURGERY     2   . LOOP RECORDER IMPLANT N/A 11/14/2013   Procedure: LOOP RECORDER IMPLANT;  Surgeon: Diane Lance, MD;  Location[facility] CATH LAB;  Service: Cardiovascular;  Laterality: N/A;  . TEE WITHOUT CARDIOVERSION N/A  11/14/2013   Procedure: TRANSESOPHAGEAL ECHOCARDIOGRAM (TEE);  Surgeon: Diane Furbish, MD;  Location: [facility] ENDOSCOPY;  Service: Cardiovascular;  Laterality: N/A;  . VENTRICULOPERITONEAL SHUNT  03/27/2012   Procedure: SHUNT INSERTION VENTRICULAR-PERITONEAL;  Surgeon: Diane Cunas, MD;  Location: [place] NEURO ORS;  Service: Neurosurgery;  Laterality: N/A;  Ventricular-Peritoneal Shunt Insertion     Current Outpatient Medications  Medication Sig Dispense Refill  . amiodarone (PACERONE) 200 MG tablet Take 2 tabs (400mg ) daily for 7 days, then 1 tab (200mg ) daily thereafter 60 tablet 1  . atorvastatin (LIPITOR) 40 MG tablet Take 40 mg by mouth daily.    Marland Kitchen lactulose (CHRONULAC) 10 GM/15ML solution Take 10 g by mouth daily as needed for moderate constipation.     . levETIRAcetam (KEPPRA) 500 MG tablet Take 500 mg by mouth 2 (two) times daily.    Marland Kitchen lidocaine-prilocaine (EMLA) cream Apply 1 application topically 3 (three) times daily. Apply prior to Dialysys  11  . multivitamin (RENA-VIT) TABS tablet Take 1 tablet by mouth daily.    . nitroGLYCERIN (NITROSTAT) 0.4 MG SL tablet Place 1 tablet (0.4 mg total) under the tongue every 5 (five) minutes as needed for chest pain. 20 tablet 0  . omeprazole (PRILOSEC) 20 MG capsule Take 20 mg by mouth daily.    . tamsulosin (  FLOMAX) 0.4 MG CAPS capsule Take 0.4 mg by mouth daily.     No current facility-administered medications for this visit.     Allergies:   Ampicillin and Penicillins    Social History:  The patient  reports that she quit smoking about 14 months ago. Her smoking use included cigarettes. She has a 7.00 pack-year smoking history. She has never used smokeless tobacco. She reports that she does not drink alcohol or use drugs.   Family History:  The patient's family history includes Coronary artery disease in her other; Heart disease in her father and mother.    ROS: All other systems are reviewed and negative. Unless otherwise mentioned in H&P     PHYSICAL EXAM: VS:  There were no vitals taken for this visit. , BMI There is no height or weight on file to calculate BMI. GEN: Well nourished, well developed, in no acute distress  HEENT: normal  Neck: no JVD, carotid bruits, or masses Cardiac: ***RRR; no murmurs, rubs, or gallops,no edema  Respiratory:  clear to auscultation bilaterally, normal work of breathing GI: soft, nontender, nondistended, + BS MS: no deformity or atrophy  Skin: warm and dry, no rash Neuro:  Strength and sensation are intact Psych: euthymic mood, full affect   EKG:  EKG {ACTION; IS/IS [id number]:[id number]} ordered today. The ekg ordered today demonstrates ***   Recent Labs: 03/20/2018: ALT 10; B Natriuretic Peptide 219.9 03/21/2018: TSH 0.554 03/24/2018: BUN 11; Creatinine, Ser 4.50; Hemoglobin 12.6; Platelets 170; Potassium 4.8; Sodium 137    Lipid Panel    Component Value Date/Time   CHOL 122 11/09/2013 0433   TRIG 92 11/09/2013 0433   HDL 47 11/09/2013 0433   CHOLHDL 2.6 11/09/2013 0433   VLDL 18 11/09/2013 0433   LDLCALC 57 11/09/2013 0433      Wt Readings from Last 3 Encounters:  03/24/18 98 lb 15.8 oz (44.9 kg)  03/01/14 114 lb (51.7 kg)  11/08/13 113 lb 11.2 oz (51.6 kg)      Other studies Reviewed: Echocardiogram  Echo 03/23/18: Study Conclusions - Left ventricle: The cavity size was normal. There was moderate concentric hypertrophy. Systolic function was normal. The estimated ejection fraction was in the range of 60% to 65%. Wall motion was normal; there were no regional wall motion abnormalities. There was an increased relative contribution of atrial contraction to ventricular filling. Doppler parameters are consistent with abnormal left ventricular relaxation (grade 1 diastolic dysfunction). - Aortic valve: Moderate focal calcification involving the left coronary cusp. - Pulmonary arteries: Systolic pressure could not be accurately estimated. - Pericardium,  extracardiac: A small, free-flowing pericardial effusion was identified circumferential to the heart. The fluid had no internal echoes.There was no evidence of hemodynamic compromise. ASSESSMENT AND PLAN:  1.  ***   Current medicines are reviewed at length with the patient today.    Labs/ tests ordered today include: *** Phill Myron. West Pugh, ANP, AACC   06/01/2018 4:00 PM    [facility] [address] [place], [place] [address] Phone: [phone]; Fax: [phone]

## 2018-06-02 ENCOUNTER — Ambulatory Visit: Payer: Medicare Other | Admitting: Adult Health

## 2018-06-03 ENCOUNTER — Encounter: Payer: Self-pay | Admitting: *Deleted

## 2019-02-02 ENCOUNTER — Other Ambulatory Visit: Payer: Self-pay

## 2019-02-02 ENCOUNTER — Encounter (HOSPITAL_COMMUNITY): Payer: Self-pay | Admitting: Emergency Medicine

## 2019-02-02 ENCOUNTER — Emergency Department (HOSPITAL_COMMUNITY): Payer: 59

## 2019-02-02 ENCOUNTER — Inpatient Hospital Stay (HOSPITAL_COMMUNITY)
Admission: EM | Admit: 2019-02-02 | Discharge: 2019-02-04 | DRG: 640 | Disposition: A | Discharge status: Home / Self Care | Payer: 59 | Attending: Internal Medicine | Admitting: Internal Medicine

## 2019-02-02 DIAGNOSIS — N2581 Secondary hyperparathyroidism of renal origin: Secondary | ICD-10-CM | POA: Diagnosis present

## 2019-02-02 DIAGNOSIS — D72819 Decreased white blood cell count, unspecified: Secondary | ICD-10-CM | POA: Diagnosis present

## 2019-02-02 DIAGNOSIS — I953 Hypotension of hemodialysis: Secondary | ICD-10-CM | POA: Diagnosis present

## 2019-02-02 DIAGNOSIS — Z95818 Presence of other cardiac implants and grafts: Secondary | ICD-10-CM

## 2019-02-02 DIAGNOSIS — Z79899 Other long term (current) drug therapy: Secondary | ICD-10-CM | POA: Diagnosis not present

## 2019-02-02 DIAGNOSIS — D631 Anemia in chronic kidney disease: Secondary | ICD-10-CM | POA: Diagnosis present

## 2019-02-02 DIAGNOSIS — Z8679 Personal history of other diseases of the circulatory system: Secondary | ICD-10-CM

## 2019-02-02 DIAGNOSIS — Z8249 Family history of ischemic heart disease and other diseases of the circulatory system: Secondary | ICD-10-CM

## 2019-02-02 DIAGNOSIS — I252 Old myocardial infarction: Secondary | ICD-10-CM

## 2019-02-02 DIAGNOSIS — I10 Essential (primary) hypertension: Secondary | ICD-10-CM | POA: Diagnosis present

## 2019-02-02 DIAGNOSIS — Z90710 Acquired absence of both cervix and uterus: Secondary | ICD-10-CM

## 2019-02-02 DIAGNOSIS — E876 Hypokalemia: Secondary | ICD-10-CM | POA: Diagnosis present

## 2019-02-02 DIAGNOSIS — A419 Sepsis, unspecified organism: Secondary | ICD-10-CM

## 2019-02-02 DIAGNOSIS — Z982 Presence of cerebrospinal fluid drainage device: Secondary | ICD-10-CM | POA: Diagnosis not present

## 2019-02-02 DIAGNOSIS — Z8673 Personal history of transient ischemic attack (TIA), and cerebral infarction without residual deficits: Secondary | ICD-10-CM

## 2019-02-02 DIAGNOSIS — I5032 Chronic diastolic (congestive) heart failure: Secondary | ICD-10-CM | POA: Diagnosis present

## 2019-02-02 DIAGNOSIS — E785 Hyperlipidemia, unspecified: Secondary | ICD-10-CM | POA: Diagnosis present

## 2019-02-02 DIAGNOSIS — R55 Syncope and collapse: Secondary | ICD-10-CM

## 2019-02-02 DIAGNOSIS — D696 Thrombocytopenia, unspecified: Secondary | ICD-10-CM | POA: Diagnosis present

## 2019-02-02 DIAGNOSIS — Z880 Allergy status to penicillin: Secondary | ICD-10-CM

## 2019-02-02 DIAGNOSIS — F17210 Nicotine dependence, cigarettes, uncomplicated: Secondary | ICD-10-CM | POA: Diagnosis present

## 2019-02-02 DIAGNOSIS — Z20828 Contact with and (suspected) exposure to other viral communicable diseases: Secondary | ICD-10-CM | POA: Diagnosis present

## 2019-02-02 DIAGNOSIS — E7800 Pure hypercholesterolemia, unspecified: Secondary | ICD-10-CM | POA: Diagnosis present

## 2019-02-02 DIAGNOSIS — R680 Hypothermia, not associated with low environmental temperature: Secondary | ICD-10-CM | POA: Diagnosis present

## 2019-02-02 DIAGNOSIS — N186 End stage renal disease: Secondary | ICD-10-CM | POA: Diagnosis present

## 2019-02-02 DIAGNOSIS — Z992 Dependence on renal dialysis: Secondary | ICD-10-CM

## 2019-02-02 DIAGNOSIS — I4891 Unspecified atrial fibrillation: Secondary | ICD-10-CM | POA: Diagnosis not present

## 2019-02-02 DIAGNOSIS — G40909 Epilepsy, unspecified, not intractable, without status epilepticus: Secondary | ICD-10-CM

## 2019-02-02 DIAGNOSIS — I132 Hypertensive heart and chronic kidney disease with heart failure and with stage 5 chronic kidney disease, or end stage renal disease: Secondary | ICD-10-CM | POA: Diagnosis present

## 2019-02-02 DIAGNOSIS — A021 Salmonella sepsis: Secondary | ICD-10-CM | POA: Diagnosis not present

## 2019-02-02 DIAGNOSIS — E861 Hypovolemia: Principal | ICD-10-CM | POA: Diagnosis present

## 2019-02-02 DIAGNOSIS — I959 Hypotension, unspecified: Secondary | ICD-10-CM | POA: Diagnosis not present

## 2019-02-02 DIAGNOSIS — I2510 Atherosclerotic heart disease of native coronary artery without angina pectoris: Secondary | ICD-10-CM | POA: Diagnosis present

## 2019-02-02 DIAGNOSIS — M898X9 Other specified disorders of bone, unspecified site: Secondary | ICD-10-CM | POA: Diagnosis present

## 2019-02-02 DIAGNOSIS — I950 Idiopathic hypotension: Secondary | ICD-10-CM | POA: Diagnosis not present

## 2019-02-02 HISTORY — DX: End stage renal disease: N18.6

## 2019-02-02 HISTORY — DX: Unspecified atrial fibrillation: I48.91

## 2019-02-02 HISTORY — DX: Dependence on renal dialysis: Z99.2

## 2019-02-02 LAB — CBC
HCT: 36.3 % (ref 36.0–46.0)
Hemoglobin: 11.1 g/dL — ABNORMAL LOW (ref 12.0–15.0)
MCH: 28.4 pg (ref 26.0–34.0)
MCHC: 30.6 g/dL (ref 30.0–36.0)
MCV: 92.8 fL (ref 80.0–100.0)
Platelets: 162 10*3/uL (ref 150–400)
RBC: 3.91 MIL/uL (ref 3.87–5.11)
RDW: 17.4 % — ABNORMAL HIGH (ref 11.5–15.5)
WBC: 3.5 10*3/uL — ABNORMAL LOW (ref 4.0–10.5)
nRBC: 0 % (ref 0.0–0.2)

## 2019-02-02 LAB — POC OCCULT BLOOD, ED: Fecal Occult Bld: NEGATIVE

## 2019-02-02 LAB — COMPREHENSIVE METABOLIC PANEL
ALT: 8 U/L (ref 0–44)
AST: 14 U/L — ABNORMAL LOW (ref 15–41)
Albumin: 2.9 g/dL — ABNORMAL LOW (ref 3.5–5.0)
Alkaline Phosphatase: 32 U/L — ABNORMAL LOW (ref 38–126)
Anion gap: 17 — ABNORMAL HIGH (ref 5–15)
BUN: 43 mg/dL — ABNORMAL HIGH (ref 6–20)
CO2: 24 mmol/L (ref 22–32)
Calcium: 8.2 mg/dL — ABNORMAL LOW (ref 8.9–10.3)
Chloride: 98 mmol/L (ref 98–111)
Creatinine, Ser: 8.48 mg/dL — ABNORMAL HIGH (ref 0.44–1.00)
GFR calc Af Amer: 6 mL/min — ABNORMAL LOW (ref >60)
GFR calc non Af Amer: 5 mL/min — ABNORMAL LOW (ref >60)
Glucose, Bld: 178 mg/dL — ABNORMAL HIGH (ref 70–99)
Potassium: 3.2 mmol/L — ABNORMAL LOW (ref 3.5–5.1)
Sodium: 139 mmol/L (ref 135–145)
Total Bilirubin: 0.4 mg/dL (ref 0.3–1.2)
Total Protein: 5.5 g/dL — ABNORMAL LOW (ref 6.5–8.1)

## 2019-02-02 LAB — TROPONIN I: Troponin I: 0.06 ng/mL (ref <0.03)

## 2019-02-02 LAB — LACTIC ACID, PLASMA
Lactic Acid, Venous: 2.6 mmol/L (ref 0.5–1.9)
Lactic Acid, Venous: 1.5 mmol/L (ref 0.5–1.9)

## 2019-02-02 LAB — MRSA PCR SCREENING: MRSA by PCR: NEGATIVE

## 2019-02-02 LAB — CBG MONITORING, ED: Glucose-Capillary: 177 mg/dL — ABNORMAL HIGH (ref 70–99)

## 2019-02-02 LAB — SARS CORONAVIRUS 2 BY RT PCR (HOSPITAL ORDER, PERFORMED IN ~~LOC~~ HOSPITAL LAB): SARS Coronavirus 2: NEGATIVE

## 2019-02-02 LAB — ETHANOL: Alcohol, Ethyl (B): <10 mg/dL (ref <10)

## 2019-02-02 MED ORDER — CINACALCET HCL 30 MG PO TABS
60.0000 mg | ORAL_TABLET | ORAL | Status: DC
  Administered 2019-02-03: 60 mg via ORAL
  Filled 2019-02-02 (×2): qty 2

## 2019-02-02 MED ORDER — HEPARIN SODIUM (PORCINE) 5000 UNIT/ML IJ SOLN
5000.0000 [IU] | Freq: Three times a day (TID) | INTRAMUSCULAR | Status: DC
  Administered 2019-02-02 – 2019-02-04 (×4): 5000 [IU] via SUBCUTANEOUS
  Filled 2019-02-02 (×5): qty 1

## 2019-02-02 MED ORDER — CHLORHEXIDINE GLUCONATE CLOTH 2 % EX PADS
6.0000 | MEDICATED_PAD | Freq: Every day | CUTANEOUS | Status: DC
  Administered 2019-02-03: 6 via TOPICAL

## 2019-02-02 MED ORDER — SODIUM CHLORIDE 0.9 % IV SOLN
2.0000 g | INTRAVENOUS | Status: DC
  Filled 2019-02-02 (×2): qty 2

## 2019-02-02 MED ORDER — VANCOMYCIN HCL IN DEXTROSE 500-5 MG/100ML-% IV SOLN
500.0000 mg | INTRAVENOUS | Status: DC
  Filled 2019-02-02 (×3): qty 100

## 2019-02-02 MED ORDER — PANTOPRAZOLE SODIUM 40 MG IV SOLR
40.0000 mg | Freq: Every day | INTRAVENOUS | Status: DC
  Administered 2019-02-02: 40 mg via INTRAVENOUS
  Filled 2019-02-02: qty 40

## 2019-02-02 MED ORDER — AMIODARONE HCL 200 MG PO TABS
200.0000 mg | ORAL_TABLET | Freq: Every day | ORAL | Status: DC
  Administered 2019-02-03 – 2019-02-04 (×2): 200 mg via ORAL
  Filled 2019-02-02 (×2): qty 1

## 2019-02-02 MED ORDER — METRONIDAZOLE IN NACL 5-0.79 MG/ML-% IV SOLN
500.0000 mg | Freq: Once | INTRAVENOUS | Status: AC
  Administered 2019-02-02: 500 mg via INTRAVENOUS
  Filled 2019-02-02: qty 100

## 2019-02-02 MED ORDER — SODIUM CHLORIDE 0.9 % IV BOLUS
250.0000 mL | Freq: Once | INTRAVENOUS | Status: AC
  Administered 2019-02-02: 250 mL via INTRAVENOUS

## 2019-02-02 MED ORDER — CALCITRIOL 0.5 MCG PO CAPS
0.5000 ug | ORAL_CAPSULE | ORAL | Status: DC
  Administered 2019-02-03: 0.5 ug via ORAL
  Filled 2019-02-02: qty 1

## 2019-02-02 MED ORDER — LEVETIRACETAM IN NACL 500 MG/100ML IV SOLN
500.0000 mg | Freq: Two times a day (BID) | INTRAVENOUS | Status: DC
  Administered 2019-02-03: 500 mg via INTRAVENOUS
  Filled 2019-02-02 (×4): qty 100

## 2019-02-02 MED ORDER — PHENYLEPHRINE HCL-NACL 10-0.9 MG/250ML-% IV SOLN
25.0000 ug/min | INTRAVENOUS | Status: DC
  Administered 2019-02-02: 25 ug/min via INTRAVENOUS
  Filled 2019-02-02: qty 250

## 2019-02-02 MED ORDER — POTASSIUM CHLORIDE CRYS ER 20 MEQ PO TBCR
40.0000 meq | EXTENDED_RELEASE_TABLET | Freq: Once | ORAL | Status: AC
  Administered 2019-02-02: 40 meq via ORAL
  Filled 2019-02-02: qty 2

## 2019-02-02 MED ORDER — CALCIUM ACETATE (PHOS BINDER) 667 MG PO CAPS
1334.0000 mg | ORAL_CAPSULE | Freq: Three times a day (TID) | ORAL | Status: DC
  Administered 2019-02-03 – 2019-02-04 (×5): 1334 mg via ORAL
  Filled 2019-02-02 (×6): qty 2

## 2019-02-02 MED ORDER — SODIUM CHLORIDE 0.9 % IV SOLN: 1.0000 g | INTRAVENOUS | Status: DC

## 2019-02-02 MED ORDER — SODIUM CHLORIDE 0.9 % IV BOLUS
1000.0000 mL | Freq: Once | INTRAVENOUS | Status: AC
  Administered 2019-02-02: 1000 mL via INTRAVENOUS

## 2019-02-02 MED ORDER — SODIUM CHLORIDE 0.9 % IV SOLN
2.0000 g | Freq: Once | INTRAVENOUS | Status: AC
  Administered 2019-02-02: 2 g via INTRAVENOUS
  Filled 2019-02-02: qty 2

## 2019-02-02 MED ORDER — VANCOMYCIN HCL IN DEXTROSE 1-5 GM/200ML-% IV SOLN
1000.0000 mg | Freq: Once | INTRAVENOUS | Status: AC
  Administered 2019-02-02: 1000 mg via INTRAVENOUS
  Filled 2019-02-02: qty 200

## 2019-02-02 MED ORDER — SUCROFERRIC OXYHYDROXIDE 500 MG PO CHEW
1000.0000 mg | CHEWABLE_TABLET | Freq: Three times a day (TID) | ORAL | Status: DC
  Administered 2019-02-03 – 2019-02-04 (×3): 1000 mg via ORAL
  Filled 2019-02-02 (×7): qty 2

## 2019-02-02 MED ORDER — SODIUM CHLORIDE 0.9 % IV BOLUS
500.0000 mL | Freq: Once | INTRAVENOUS | Status: AC
  Administered 2019-02-02: 500 mL via INTRAVENOUS

## 2019-02-02 MED ORDER — SODIUM CHLORIDE 0.9 % IV SOLN: 250.0000 mL | INTRAVENOUS | Status: DC

## 2019-02-02 NOTE — ED Notes (Signed)
PA Joy at bedside and aware of rectal temp 96.2

## 2019-02-02 NOTE — ED Provider Notes (Cosign Needed)
3:57 PM BP (!) 88/51   Pulse 66   Temp (!) 96.2 F (35.7 C) (Rectal)   Resp 14   SpO2 100%  Patient taken in sign out at shift change from PA JoY. Here for synciope. Hx of ESRD dialyzed today.  Found to be hypotensive with elevated Lactate, Low White count, and hypothermia. Treated as sepsis  Shew jas received her 24ml/kg fluids and her BP has increased, however she continues to stay with sof pressures,She has a history of chronic bradycardia. She is currently getting another 500 bolus. We will reevaluate pressures.  Expect admission, ICU versus stepdown. Dr. Jimmy Footman who is her nephrologist has requested COVID testing.   I have ordered the test.  5:09 PM BP (!) 89/48   Pulse 65   Temp (!) 96.2 F (35.7 C) (Rectal)   Resp (!) 21   SpO2 100%  Patient BP continues to stay soft.  She has received >43ml/kg Review of her Previous BP shows she normally has systolic pressure > 628.  I have placed a consult to intensivest.  6:15 PM BP (!) 82/51   Pulse 69   Temp (!) 96.2 F (35.7 C) (Rectal)   Resp 14   SpO2 100%  Patient will be admitted by [facility] Pressures are not improving. CRITICAL CARE Performed by: Margarita Mail Total critical care time: 35 minutes Critical care time was exclusive of separately billable procedures and treating other patients. Critical care was necessary to treat or prevent imminent or life-threatening deterioration. Critical care was time spent personally by me on the following activities: development of treatment plan with patient and/or surrogate as well as nursing, discussions with consultants, evaluation of patient's response to treatment, examination of patient, obtaining history from patient or surrogate, ordering and performing treatments and interventions, ordering and review of laboratory studies, ordering and review of radiographic studies, pulse oximetry and re-evaluation of patient's condition.    Margarita Mail, PA-C 02/02/19 1815

## 2019-02-02 NOTE — Consult Note (Addendum)
[facility] Renal Consultation Note    Indication for Consultation:  Management of ESRD/hemodialysis; anemia, hypertension/volume and secondary hyperparathyroidism PCP:  HPI: [name] is a 58 y.o. female with ESRD on hemodialysis T,Th,S at [facility]. PMH significant for HTN, ICH x2 with VP shunt placement, seizure disorder, CAD, grade 2 diastolic DD, AFib RVR, SHPT, AOCD. Last HD 02/01/19 left 0.5 kg under EDW. Appears compliant with HD prescription.   Patient presented to ED today after family heard patient fall, found patient on floor. Upon arrival to ED, patient was noted to be hypotensive BP 64/43 T 97.4 HR 65 RR 16. O2 Sats 99 on RA. WBC 3.5 lactic acid 2.6 on arrival down to 1.5 after NS boluses. CXR unremarkable, K+ 3.2 BS 178 Troponin 0.06 HGB 11.1 PLT 162.  She has been started on sepsis protocol-BC obtained,has rec'd vancomycin, cefepime and flagyl.   Seen in ED, oriented X 2. Denies C/O chest pain/SOB, denies abdominal pain/flank pain, fever, chills, N,V,D. Does not remember fall. She appears very lethargic but does follow commands. She does not appear volume overloaded-in fact she appears dry. Have requested COVID 19 screening/testing if deemed appropriate per primary.   Past Medical History:  Diagnosis Date  . Anterior cerebral artery aneurysm    1996 AT [facility]  . Coronary atherosclerosis of native coronary artery    STEMI Feb 2009 - 70% distal LAD lesion c/w plaque rupture, DES distal RCA  . Diastolic heart failure    LVEF 60-65% with grade 2 diastolic dysfunction - July 2014  . ESRD (end stage renal disease) on dialysis ([place])   . HTN (hypertension)    Resistant  . Hypercholesterolemia   . Subarachnoid hemorrhage ([place])    03/2012  . Tobacco abuse    Resumed smoking half a pack a day. She was a smoker in the past and states she was abl eto stop in the past using nicotine patches   Past Surgical History:  Procedure Laterality Date  . ABDOMINAL  HYSTERECTOMY    . BASCILIC VEIN TRANSPOSITION Right 05/13/2013   Procedure: [place];  Surgeon: Rosetta Posner, MD;  Location: [place];  Service: Vascular;  Laterality: Right;  . BRAIN SURGERY     2   . LOOP RECORDER IMPLANT N/A 11/14/2013   Procedure: LOOP RECORDER IMPLANT;  Surgeon: Evans Lance, MD;  Location: [facility] CATH LAB;  Service: Cardiovascular;  Laterality: N/A;  . TEE WITHOUT CARDIOVERSION N/A 11/14/2013   Procedure: TRANSESOPHAGEAL ECHOCARDIOGRAM (TEE);  Surgeon: Candee Furbish, MD;  Location: [facility] ENDOSCOPY;  Service: Cardiovascular;  Laterality: N/A;  . VENTRICULOPERITONEAL SHUNT  03/27/2012   Procedure: SHUNT INSERTION VENTRICULAR-PERITONEAL;  Surgeon: Winfield Cunas, MD;  Location: [place] NEURO ORS;  Service: Neurosurgery;  Laterality: N/A;  Ventricular-Peritoneal Shunt Insertion   Family History  Problem Relation Age of Onset  . Heart disease Mother   . Heart disease Father   . Coronary artery disease Other        Premature in 1st degree relatives   Social History:  reports that she quit smoking about 22 months ago. Her smoking use included cigarettes. She has a 7.00 pack-year smoking history. She has never used smokeless tobacco. She reports that she does not drink alcohol or use drugs. Allergies  Allergen Reactions  . Ampicillin Hives and Rash    Has patient had a PCN reaction causing immediate rash, facial/tongue/throat swelling, SOB or lightheadedness with hypotension: No Has patient had a PCN reaction causing severe rash  involving mucus membranes or skin necrosis: No Has patient had a PCN reaction that required hospitalization: No Has patient had a PCN reaction occurring within the last 10 years: No If all of the above answers are "NO", then may proceed with Cephalosporin use.   Marland Kitchen Penicillins Hives and Rash    Has patient had a PCN reaction causing immediate rash, facial/tongue/throat swelling, SOB or lightheadedness with hypotension: Yes Has patient had a PCN  reaction causing severe rash involving mucus membranes or skin necrosis: No Has patient had a PCN reaction that required hospitalization: No Has patient had a PCN reaction occurring within the last 10 years: No If all of the above answers are "NO", then may proceed with Cephalosporin use.    Prior to Admission medications   Medication Sig Start Date End Date Taking? Authorizing Provider  amiodarone (PACERONE) 200 MG tablet Take 2 tabs (400mg ) daily for 7 days, then 1 tab (200mg ) daily thereafter 03/24/18   Rai, Vernelle Emerald, MD  atorvastatin (LIPITOR) 40 MG tablet Take 40 mg by mouth daily.    [provider]  lactulose (CHRONULAC) 10 GM/15ML solution Take 10 g by mouth daily as needed for moderate constipation.     [provider]  levETIRAcetam (KEPPRA) 500 MG tablet Take 500 mg by mouth 2 (two) times daily.    [provider]  lidocaine-prilocaine (EMLA) cream Apply 1 application topically 3 (three) times daily. Apply prior to [facility] 02/25/18   [provider]  multivitamin (RENA-VIT) TABS tablet Take 1 tablet by mouth daily.    [provider]  nitroGLYCERIN (NITROSTAT) 0.4 MG SL tablet Place 1 tablet (0.4 mg total) under the tongue every 5 (five) minutes as needed for chest pain. 05/18/13   Janece Canterbury, MD  omeprazole (PRILOSEC) 20 MG capsule Take 20 mg by mouth daily.    [provider]  tamsulosin (FLOMAX) 0.4 MG CAPS capsule Take 0.4 mg by mouth daily.    [provider]   Current Facility-Administered Medications  Medication Dose Route Frequency Provider Last Rate Last Dose  . vancomycin (VANCOCIN) IVPB 1000 mg/200 mL premix  1,000 mg Intravenous Once Rumbarger, Rachel L, RPH 200 mL/hr at 02/02/19 1604 1,000 mg at 02/02/19 1604  . [START ON 02/03/2019] vancomycin (VANCOCIN) IVPB 500 mg/100 ml premix  500 mg Intravenous Q T,Th,Sa-HD Rumbarger, Valeda Malm, RPH       Current Outpatient Medications  Medication Sig Dispense  Refill  . amiodarone (PACERONE) 200 MG tablet Take 2 tabs (400mg ) daily for 7 days, then 1 tab (200mg ) daily thereafter 60 tablet 1  . atorvastatin (LIPITOR) 40 MG tablet Take 40 mg by mouth daily.    Marland Kitchen lactulose (CHRONULAC) 10 GM/15ML solution Take 10 g by mouth daily as needed for moderate constipation.     . levETIRAcetam (KEPPRA) 500 MG tablet Take 500 mg by mouth 2 (two) times daily.    Marland Kitchen lidocaine-prilocaine (EMLA) cream Apply 1 application topically 3 (three) times daily. Apply prior to [employer]  11  . multivitamin (RENA-VIT) TABS tablet Take 1 tablet by mouth daily.    . nitroGLYCERIN (NITROSTAT) 0.4 MG SL tablet Place 1 tablet (0.4 mg total) under the tongue every 5 (five) minutes as needed for chest pain. 20 tablet 0  . omeprazole (PRILOSEC) 20 MG capsule Take 20 mg by mouth daily.    . tamsulosin (FLOMAX) 0.4 MG CAPS capsule Take 0.4 mg by mouth daily.     Labs: Basic Metabolic Panel: Recent Labs  Lab 02/02/19 1230  NA 139  K 3.2*  CL 98  CO2 24  GLUCOSE 178*  BUN 43*  CREATININE 8.48*  CALCIUM 8.2*   Liver Function Tests: Recent Labs  Lab 02/02/19 1230  AST 14*  ALT 8  ALKPHOS 32*  BILITOT 0.4  PROT 5.5*  ALBUMIN 2.9*   No results for input(s): LIPASE, AMYLASE in the last 168 hours. No results for input(s): AMMONIA in the last 168 hours. CBC: Recent Labs  Lab 02/02/19 1230  WBC 3.5*  HGB 11.1*  HCT 36.3  MCV 92.8  PLT 162   Cardiac Enzymes: Recent Labs  Lab 02/02/19 1230  TROPONINI 0.06*   CBG: Recent Labs  Lab 02/02/19 1239  GLUCAP 177*   Iron Studies: No results for input(s): IRON, TIBC, TRANSFERRIN, FERRITIN in the last 72 hours. Studies/Results: Ct Head Wo Contrast  Result Date: 02/02/2019 CLINICAL DATA:  58 year old female with a history of unresponsive EXAM: CT HEAD WITHOUT CONTRAST CT CERVICAL SPINE WITHOUT CONTRAST TECHNIQUE: Multidetector CT imaging of the head and cervical spine was performed following the standard protocol without  intravenous contrast. Multiplanar CT image reconstructions of the cervical spine were also generated. COMPARISON:  02/16/2018 FINDINGS: CT HEAD FINDINGS Brain: No acute intracranial hemorrhage. No midline shift or mass effect. Gray-white differentiation maintained. Encephalomalacia of the left parietal and posterior temporal region. Coil mass in the basilar tip, unchanged. Right frontal approach ventriculostomy terminates just beyond the midline, unchanged. 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 Calcifications of the intracranial vasculature. Skull: No displaced fracture. Surgical changes of the right pterional craniotomy. Shunt tubing within the scalp soft tissues. Sinuses/Orbits: No acute finding. Other: None CT CERVICAL SPINE FINDINGS Alignment: Craniocervical junction aligned. Anatomic alignment of the cervical elements. No subluxation. Skull base and vertebrae: No acute fracture at the skullbase. Vertebral body heights relatively maintained. No acute fracture identified. Soft tissues and spinal canal: Unremarkable cervical soft tissues. Lymph nodes are present, though not enlarged. Disc levels: Disc disease at C5-C6 with posterior disc osteophyte complex and bilateral uncovertebral joint disease contributing to bilateral left greater than right foraminal narrowing. Focal central disc protrusion at C4-C5, may contact the ventral thecal sac. Focal central disc protrusion at C5-C6 superimposed on disc osteophyte complex, may contact the ventral thecal sac. Upper chest: Unremarkable appearance of the lung apices. Other: Cystic nodule of the left thyroid. Vascular calcifications of bilateral carotid region. IMPRESSION: Head CT: No acute intracranial abnormality. Redemonstration of encephalomalacia of the left posterior temporal and parietal region. Redemonstration of prior treatment of right MCA, right paraclinoid, and basilar  aneurysms. Unchanged position of right frontal approach ventriculostomy. Cervical CT: No acute fracture or malalignment of the cervical spine. Degenerative disc disease worst at the C5-C6 level, with focal posterior disc protrusion superimposed on disc osteophyte complex, potentially contacting the ventral thecal sac. A second posterior disc protrusion at C4-C5 may contact the ventral thecal sac. Electronically Signed   By: Corrie Mckusick D.O.   On: 02/02/2019 16:32   Ct Cervical Spine Wo Contrast  Result Date: 02/02/2019 CLINICAL DATA:  58 year old female with a history of unresponsive EXAM: CT HEAD WITHOUT CONTRAST CT CERVICAL SPINE WITHOUT CONTRAST TECHNIQUE: Multidetector CT imaging of the head and cervical spine was performed following the standard protocol without intravenous contrast. Multiplanar CT image reconstructions of the cervical spine were also generated. COMPARISON:  02/16/2018 FINDINGS: CT HEAD FINDINGS Brain: No acute intracranial hemorrhage. No midline shift or  mass effect. Gray-white differentiation maintained. Encephalomalacia of the left parietal and posterior temporal region. Coil mass in the basilar tip, unchanged. Right frontal approach ventriculostomy terminates just beyond the midline, unchanged. 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 Calcifications of the intracranial vasculature. Skull: No displaced fracture. Surgical changes of the right pterional craniotomy. Shunt tubing within the scalp soft tissues. Sinuses/Orbits: No acute finding. Other: None CT CERVICAL SPINE FINDINGS Alignment: Craniocervical junction aligned. Anatomic alignment of the cervical elements. No subluxation. Skull base and vertebrae: No acute fracture at the skullbase. Vertebral body heights relatively maintained. No acute fracture identified. Soft tissues and spinal canal: Unremarkable cervical soft tissues. Lymph nodes are  present, though not enlarged. Disc levels: Disc disease at C5-C6 with posterior disc osteophyte complex and bilateral uncovertebral joint disease contributing to bilateral left greater than right foraminal narrowing. Focal central disc protrusion at C4-C5, may contact the ventral thecal sac. Focal central disc protrusion at C5-C6 superimposed on disc osteophyte complex, may contact the ventral thecal sac. Upper chest: Unremarkable appearance of the lung apices. Other: Cystic nodule of the left thyroid. Vascular calcifications of bilateral carotid region. IMPRESSION: Head CT: No acute intracranial abnormality. Redemonstration of encephalomalacia of the left posterior temporal and parietal region. Redemonstration of prior treatment of right MCA, right paraclinoid, and basilar aneurysms. Unchanged position of right frontal approach ventriculostomy. Cervical CT: No acute fracture or malalignment of the cervical spine. Degenerative disc disease worst at the C5-C6 level, with focal posterior disc protrusion superimposed on disc osteophyte complex, potentially contacting the ventral thecal sac. A second posterior disc protrusion at C4-C5 may contact the ventral thecal sac. Electronically Signed   By: Corrie Mckusick D.O.   On: 02/02/2019 16:32   Dg Chest Portable 1 View  Result Date: 02/02/2019 CLINICAL DATA:  Syncope.  Former smoker. EXAM: PORTABLE CHEST 1 VIEW COMPARISON:  03/20/2018 FINDINGS: Catheter extending over the right neck, chest and abdomen likely ventriculostomy catheter and unchanged. Lungs are adequately inflated without consolidation or effusion. Cardiomediastinal silhouette and remainder of the exam is unchanged. IMPRESSION: No active disease. Electronically Signed   By: Marin Olp M.D.   On: 02/02/2019 13:39    ROS: As per HPI otherwise negative.  Physical Exam: Vitals:   02/02/19 1500 02/02/19 1515 02/02/19 1545 02/02/19 1630  BP: (!) 84/50 (!) 88/51 (!) 84/48 (!) 89/48  Pulse: 65 66 66 65   Resp: 14 14 12  (!) 21  Temp:      TempSrc:      SpO2: 100% 100% 100% 100%     General: Chronically ill cachetic appearing female no acute distress. Looks older than stated age. Head: Normocephalic, atraumatic, sclera non-icteric, mucus membranes are moist Neck: Supple. JVD not elevated. Lungs: Clear bilaterally to auscultation without wheezes, rales, or rhonchi. Breathing is unlabored. Heart: RRR with S1 S2. 2/6 systolic M. No R/G. SR on monitor. HR 60s.  Abdomen: Soft, non-tender, non-distended with normoactive bowel sounds. No rebound/guarding. No obvious abdominal masses. M-S:  Strength and tone appear normal for age. Lower extremities:without edema or ischemic changes, no open wounds  Neuro: Alert and oriented X 2. Moves all extremities spontaneously. Psych:  Lethargic, opens eyes to verbal stimuli-will answer questions then closes eyes.  Dialysis Access: RUA AVF + T/B  Dialysis Orders: [facility] T,Th, S 4 hrs 180NRe 400/800 41.5 kg 2.0 K/ 2.0 Ca  R AVF -No heparin -No ESA/Venofer -Sensipar 60 mg PO TIW (last PTH 289  01/27/19) -Calcitriol 0.5 mcg PO TIW   BMD meds: Calcium acetate 667 mg 2 capsules PO TIW and 1 capsule w/snack Velphoro 500 mg 3 tabs PO TID AC (last Ca 9.8 Phos 7.1 01/27/19)  Assessment/Plan: 1.  Syncope / hypotension - possible Sepsis-per primary. Concern for possible COVID 19.  2.  Hypokalemia-K+ 3.2. Give 40 MEQ KCL now, recheck RFP in AM. Use 4.0 K bath with HD tomorrow.  3.  ESRD - T,Th,S. No compelling HD needs today. HD tomorrow on schedule. No heparin, 4.0 K bath.  4.  Hypotension/volume  - BP now up to 89/48. No antihypertensive meds on OP med list. Has rec'd NS bolus in ED appropriately as patient appears on dry side. Minimal UF with HD tomorrow.  5.  Anemia  - HGB 11.1 No ESA needed. Follow HGB 6.  Metabolic bone disease - Ca 8.2 C Ca 9.2. Continue binders, sensipar VDRA when eating  7.  Nutrition - NPO at present. Albumin 2.9. Renal diet/renal  vits/prostat when eating.  8.  H/O Afib RVR-On amiodarone-no anticoagulation  9.  H/O SAH 10. H/O seizure disorder on keppra 11. H/O CAD remote history of  PTCI to RCA  12.  HFpEF-Grade 1 diastolic dysfunction-EF 44-92% 03/23/2018  Rita H. Owens Shark, NP-C 02/02/2019, 4:42 PM  [facility] Beeper [phone]  Pt seen, examined and agree w A/P as above.  [facility] 02/03/2019, 8:20 AM

## 2019-02-02 NOTE — ED Notes (Signed)
Patient transported to CT 

## 2019-02-02 NOTE — ED Provider Notes (Signed)
[facility] EMERGENCY DEPARTMENT Provider Note   CSN: [id number] Arrival date & time: 02/02/19  1208    History   Chief Complaint Chief Complaint  Patient presents with  . Loss of Consciousness    HPI [name] is a 58 y.o. female.     HPI  [name] is a 58 y.o. female, with a history of anterior cerebral artery aneurysm, hypercholesterolemia, HTN, ESRD on dialysis, diastolic heart failure, subarachnoid hemorrhage, A. fib, presenting to the ED via EMS for evaluation following a syncopal episode.  Patient reported to be previously well, family [name] the patient fall, found by family on the floor minimally responsive.  Upon EMS arrival, patient was alert and oriented, but hypotensive at 64/40. Received 300 cc normal saline.  CBG 198.   Patient receives dialysis on a Tuesday, Thursday, Saturday schedule.  She completed a session of dialysis yesterday.  She still makes urine.  Patient has no complaints.  She states she does not remember the syncopal episode.  Denies recent illness, fever, N/V/C/D, chest pain, shortness of breath, abdominal pain, headache, neuro deficit, neck/back pain, or any other complaints.   Past Medical History:  Diagnosis Date  . Anterior cerebral artery aneurysm    1996 AT [facility]  . Coronary atherosclerosis of native coronary artery    STEMI Feb 2009 - 70% distal LAD lesion c/w plaque rupture, DES distal RCA  . Diastolic heart failure    LVEF 60-65% with grade 2 diastolic dysfunction - July 2014  . ESRD (end stage renal disease) on dialysis ([place])   . HTN (hypertension)    Resistant  . Hypercholesterolemia   . Subarachnoid hemorrhage ([place])    03/2012  . Tobacco abuse    Resumed smoking half a pack a day. She was a smoker in the past and states she was abl eto stop in the past using nicotine patches    Patient Active Problem List   Diagnosis Date Noted  . Palliative care encounter   . Atrial fibrillation with RVR ([place])  03/20/2018  . History of embolic stroke 86/57/8469  . Altered mental status 11/08/2013  . Acute encephalopathy 11/08/2013  . HTN (hypertension) 11/08/2013  . Hypertensive renal disease 11/06/2013  . Protein-calorie malnutrition, severe ([place]) 11/04/2013  . Seizure disorder ([place]) 10/22/2013  . Hypertensive urgency 10/20/2013  . Hypertensive heart disease 05/07/2013  . Acute-on-chronic renal failure ([place]) 05/07/2013  . HTN (hypertension), malignant 05/06/2013  . CAD (coronary artery disease) 05/06/2013  . ESRD (end stage renal disease) ([place]) 05/06/2013  . Chronic diastolic heart failure ([place])   . Tobacco abuse 09/25/2012  . H/O noncompliance with medical treatment, presenting hazards to health   . Renal artery stenosis ([place]) 04/05/2012  . History of subarachnoid hemorrhage 03/03/2012  . Acute on chronic diastolic heart failure ([place]) 09/19/2008    Past Surgical History:  Procedure Laterality Date  . ABDOMINAL HYSTERECTOMY    . BASCILIC VEIN TRANSPOSITION Right 05/13/2013   Procedure: [place];  Surgeon: Rosetta Posner, MD;  Location: [facility];  Service: Vascular;  Laterality: Right;  . BRAIN SURGERY     2   . LOOP RECORDER IMPLANT N/A 11/14/2013   Procedure: LOOP RECORDER IMPLANT;  Surgeon: Evans Lance, MD;  Location: [facility] CATH LAB;  Service: Cardiovascular;  Laterality: N/A;  . TEE WITHOUT CARDIOVERSION N/A 11/14/2013   Procedure: TRANSESOPHAGEAL ECHOCARDIOGRAM (TEE);  Surgeon: Candee Furbish, MD;  Location: [facility] ENDOSCOPY;  Service: Cardiovascular;  Laterality: N/A;  . VENTRICULOPERITONEAL SHUNT  03/27/2012   Procedure: SHUNT INSERTION VENTRICULAR-PERITONEAL;  Surgeon: Winfield Cunas, MD;  Location: [facility] NEURO ORS;  Service: Neurosurgery;  Laterality: N/A;  Ventricular-Peritoneal Shunt Insertion     OB History   No obstetric history on file.      Home Medications    Prior to Admission medications   Medication Sig Start Date End Date Taking? Authorizing Provider   amiodarone (PACERONE) 200 MG tablet Take 2 tabs (400mg ) daily for 7 days, then 1 tab (200mg ) daily thereafter 03/24/18   Rai, Vernelle Emerald, MD  atorvastatin (LIPITOR) 40 MG tablet Take 40 mg by mouth daily.    [provider]  lactulose (CHRONULAC) 10 GM/15ML solution Take 10 g by mouth daily as needed for moderate constipation.     [provider]  levETIRAcetam (KEPPRA) 500 MG tablet Take 500 mg by mouth 2 (two) times daily.    [provider]  lidocaine-prilocaine (EMLA) cream Apply 1 application topically 3 (three) times daily. Apply prior to [facility] 02/25/18   [provider]  multivitamin (RENA-VIT) TABS tablet Take 1 tablet by mouth daily.    [provider]  nitroGLYCERIN (NITROSTAT) 0.4 MG SL tablet Place 1 tablet (0.4 mg total) under the tongue every 5 (five) minutes as needed for chest pain. 05/18/13   Janece Canterbury, MD  omeprazole (PRILOSEC) 20 MG capsule Take 20 mg by mouth daily.    [provider]  tamsulosin (FLOMAX) 0.4 MG CAPS capsule Take 0.4 mg by mouth daily.    [provider]    Family History Family History  Problem Relation Age of Onset  . Heart disease Mother   . Heart disease Father   . Coronary artery disease Other        Premature in 1st degree relatives    Social History Social History   Tobacco Use  . Smoking status: Former Smoker    Packs/day: 0.20    Years: 35.00    Pack years: 7.00    Types: Cigarettes    Last attempt to quit: 03/20/2017    Years since quitting: 1.8  . Smokeless tobacco: Never Used  . Tobacco comment: 1/2 ppd   Substance Use Topics  . Alcohol use: No  . Drug use: No     Allergies   Ampicillin and Penicillins   Review of Systems Review of Systems  Constitutional: Negative for fever.  Respiratory: Negative for cough and shortness of breath.   Cardiovascular: Negative for chest pain and leg swelling.  Gastrointestinal: Negative for abdominal pain, blood in stool,  constipation, diarrhea, nausea and vomiting.  Genitourinary: Negative for dysuria, flank pain and hematuria.  Musculoskeletal: Negative for back pain and neck pain.  Neurological: Positive for syncope. Negative for dizziness, weakness, light-headedness, numbness and headaches.  All other systems reviewed and are negative.    Physical Exam Updated Vital Signs BP (!) 64/43 (BP Location: Right Arm)   Pulse 65   Temp (!) 97.4 F (36.3 C) (Oral)   Resp 16   SpO2 99%   Physical Exam Vitals signs and nursing note reviewed.  Constitutional:      General: She is not in acute distress.    Appearance: She is cachectic. She is not diaphoretic.     Comments: Chronically ill-appearing  HENT:     Head: Normocephalic and atraumatic.     Mouth/Throat:     Mouth: Mucous membranes are moist.     Pharynx: Oropharynx is clear.  Eyes:     Conjunctiva/sclera:  Conjunctivae normal.  Neck:     Musculoskeletal: Neck supple.  Cardiovascular:     Rate and Rhythm: Normal rate and regular rhythm.     Pulses: Normal pulses.     Heart sounds: Normal heart sounds.     Comments: Tactile temperature in the extremities appropriate and equal bilaterally. Pulmonary:     Effort: Pulmonary effort is normal. No respiratory distress.     Breath sounds: Normal breath sounds.  Abdominal:     Palpations: Abdomen is soft.     Tenderness: There is no abdominal tenderness. There is no guarding.  Genitourinary:    Comments: No frank blood or melena. RN, Lonn Georgia, served as chaperone during the rectal exam. Musculoskeletal:     Right lower leg: No edema.     Left lower leg: No edema.     Comments: Normal motor function intact in all extremities. No midline spinal tenderness.   Lymphadenopathy:     Cervical: No cervical adenopathy.  Skin:    General: Skin is warm and dry.  Neurological:     Mental Status: She is oriented to person, place, and time and easily aroused.     Comments: Sensation grossly intact to light  touch in the extremities.  Grip strengths equal bilaterally.  Strength 5/5 in all extremities. Coordination intact. Cranial nerves III-XII grossly intact. No facial droop.   Psychiatric:        Speech: Speech normal.      ED Treatments / Results  Labs (all labs ordered are listed, but only abnormal results are displayed) Labs Reviewed  CBC - Abnormal; Notable for the following components:      Result Value   WBC 3.5 (*)    Hemoglobin 11.1 (*)    RDW 17.4 (*)    All other components within normal limits  COMPREHENSIVE METABOLIC PANEL - Abnormal; Notable for the following components:   Potassium 3.2 (*)    Glucose, Bld 178 (*)    BUN 43 (*)    Creatinine, Ser 8.48 (*)    Calcium 8.2 (*)    Total Protein 5.5 (*)    Albumin 2.9 (*)    AST 14 (*)    Alkaline Phosphatase 32 (*)    GFR calc non Af Amer 5 (*)    GFR calc Af Amer 6 (*)    Anion gap 17 (*)    All other components within normal limits  TROPONIN I - Abnormal; Notable for the following components:   Troponin I 0.06 (*)    All other components within normal limits  LACTIC ACID, PLASMA - Abnormal; Notable for the following components:   Lactic Acid, Venous 2.6 (*)    All other components within normal limits  CBG MONITORING, ED - Abnormal; Notable for the following components:   Glucose-Capillary 177 (*)    All other components within normal limits  CULTURE, BLOOD (ROUTINE X 2)  CULTURE, BLOOD (ROUTINE X 2)  ETHANOL  RAPID URINE DRUG SCREEN, HOSP PERFORMED  URINALYSIS, ROUTINE W REFLEX MICROSCOPIC  LACTIC ACID, PLASMA  POC OCCULT BLOOD, ED    EKG EKG Interpretation  Date/Time:  Wednesday February 02 2019 12:15:56 EDT Ventricular Rate:  67 PR Interval:    QRS Duration: 118 QT Interval:  517 QTC Calculation: 546 R Axis:   60 Text Interpretation:  Sinus rhythm LAE, consider biatrial enlargement LVH with secondary repolarization abnormality Anterior infarct, old Prolonged QT interval No significant change since  last tracing Confirmed by Fredia Sorrow [phone]) on  02/02/2019 12:40:55 PM   Radiology Dg Chest Portable 1 View  Result Date: 02/02/2019 CLINICAL DATA:  Syncope.  Former smoker. EXAM: PORTABLE CHEST 1 VIEW COMPARISON:  03/20/2018 FINDINGS: Catheter extending over the right neck, chest and abdomen likely ventriculostomy catheter and unchanged. Lungs are adequately inflated without consolidation or effusion. Cardiomediastinal silhouette and remainder of the exam is unchanged. IMPRESSION: No active disease. Electronically Signed   By: Marin Olp M.D.   On: 02/02/2019 13:39    Procedures Procedures (including critical care time)  Medications Ordered in ED Medications  vancomycin (VANCOCIN) IVPB 1000 mg/200 mL premix (has no administration in time range)  vancomycin (VANCOCIN) IVPB 500 mg/100 ml premix (has no administration in time range)  sodium chloride 0.9 % bolus 500 mL (has no administration in time range)  sodium chloride 0.9 % bolus 250 mL (0 mLs Intravenous Stopped 02/02/19 1301)  metroNIDAZOLE (FLAGYL) IVPB 500 mg (0 mg Intravenous Stopped 02/02/19 1454)  sodium chloride 0.9 % bolus 1,000 mL (1,000 mLs Intravenous New Bag/Given 02/02/19 1330)  ceFEPIme (MAXIPIME) 2 g in sodium chloride 0.9 % 100 mL IVPB (0 g Intravenous Stopped 02/02/19 1541)     Initial Impression / Assessment and Plan / ED Course  I have reviewed the triage vital signs and the nursing notes.  Pertinent labs & imaging results that were available during my care of the patient were reviewed by me and considered in my medical decision making (see chart for details).  Clinical Course as of Feb 01 1557  Wed Feb 02, 2019  1255 Patient reassessed after 250 cc bolus.  She is mildly somnolent, but oriented.  No change in BP.  No increased work of breathing.  SPO2 100% on room air.  Lungs continue to be clear.   [SJ]  1340 Lungs continue to be clear. No increased work of breathing.  SPO2 99% on room air.   [SJ]  [place], NP with nephrology, came to assess patient. Also notes Dr. Jimmy Footman would like patient tested for COVID-19.   [SJ]  1446 Patient seems to be sharper and less somnolent. No increased work of breathing, SPO2 at 100% on room air, lungs clear.  BP 78/52 (MAP 62).   [SJ]  1545 Spoke with Dr. Jonnie Finner. Agrees with work up to this point.    [SJ]    Clinical Course User Index [SJ] Renia Mikelson C, PA-C        Patient presents following a syncopal episode.  She presented here to the ED with some somnolence, but has been oriented during her course.  Found to be hypotensive and hypothermic.  Sepsis work-up was initiated, though we do not have a strong indication for possible source at this time. Also noted to be leukopenic. Hypotension responded to IV fluids and because she maintained proper mental status, IV fluids were continued for pressure management.  Patient was repeatedly reassessed and was monitored for signs of fluid overload. There were no signs of fluid overload across these multiple assessments.  Her troponin was mildly elevated at 0.06 without acute EKG changes.  My suspicion for an acute cardiac pathology is low.   My suspicion for COVID-19 infection is low.  She has no hypoxia, no abnormalities noted on CXR, denies recent illness, including respiratory symptoms, and denies contact with any known or suspected COVID-19 patients.  Findings and plan of care discussed with Fredia Sorrow, MD. Dr. Rogene Houston personally evaluated and examined this patient.  Poor IV access and restricted extremity due  to patient's fistula complicated patient's work-up.   End of shift patient care handoff given to Margarita Mail, PA-C. Plan: Finish next fluid bolus, reassess pressure and overall status, admit to appropriate service.   Vitals:   02/02/19 1300 02/02/19 1330 02/02/19 1345 02/02/19 1400  BP: (!) 66/46 (!) 74/49 (!) 71/47 (!) 75/50  Pulse: 64 64 65 63  Resp: 17 20 16 14   Temp: (!)  96.2 F (35.7 C)     TempSrc: Rectal     SpO2: 100% 100% 100% 100%   Vitals:   02/02/19 1400 02/02/19 1430 02/02/19 1500 02/02/19 1515  BP: (!) 75/50 (!) 76/50 (!) 84/50 (!) 88/51  Pulse: 63 64 65 66  Resp: 14 14 14 14   Temp:      TempSrc:      SpO2: 100% 100% 100% 100%     Final Clinical Impressions(s) / ED Diagnoses   Final diagnoses:  Syncope and collapse    ED Discharge Orders    None       Layla Maw 02/02/19 1558    Fredia Sorrow, MD 02/05/19 1402

## 2019-02-02 NOTE — ED Notes (Addendum)
This RN unable to get 2nd set of cutures or lactic, phlebotomy notified to attempt.

## 2019-02-02 NOTE — ED Notes (Signed)
MD Zackowski at bedside, aware of hypotension in the 98'V systolic, will continue to monitor.

## 2019-02-02 NOTE — H&P (Signed)
NAMEMaral [name], MRN:  [id number], DOB:  12/10/1960, LOS: 0 ADMISSION DATE:  02/02/2019, CONSULTATION DATE:  4/15 REFERRING MD:  Dr Rogene Houston EDP, CHIEF COMPLAINT:  Hypotension   Brief History   58 year old female with multiple medical problems including ESRD had syncopal episode causing ED presentation. Found to be hypotensive refractory to IVF resuscitation.   History of present illness   Patient is encephalopathic and/or intubated. Therefore history has been obtained from chart review.  58 year old female with past medical history as below, which is significant for ESRD on HD with dialysis days Tuesday, Thursday, Saturday, subarachnoid hemorrhage, hypertension, congestive heart failure, and seizures. She last had HD 4/14. Left 0.5kg under her dry weight. No known complaints otherwise. Suffered a fall/syncopal event in the afternoon hours of 4/15. EMS was called and she was transported to the ED where she was found to be hypotensive to 64/43. She also had alteration of mental status. Initial lactic acid elevated. She received 1.5L saline infusion and BP somewhat improved, but not enough. Lactic acid normalized. She was hypothermic and leukopenic. She was started on antibiotics to rule out sepsis. Due to hypotension PCCM was asked to evaluate for admission.   Past Medical History   has a past medical history of Anterior cerebral artery aneurysm, Coronary atherosclerosis of native coronary artery, Diastolic heart failure, ESRD (end stage renal disease) on dialysis ([place]), HTN (hypertension), Hypercholesterolemia, Subarachnoid hemorrhage ([place]), and Tobacco abuse.   Significant Hospital Events   4/15 admit  Consults:  Nephrology 4/15  Procedures:   Significant Diagnostic Tests:  CT head 4/15: No acute intracranial abnormality. Redemonstration of encephalomalacia of the left posterior temporal and parietal region. Redemonstration of prior treatment of right MCA, right paraclinoid, and  basilar aneurysms. Unchanged position of right frontal approach ventriculostomy. No acute fracture or malalignment of the cervical spine. Degenerative disc disease worst at the C5-C6 level, with focal posterior disc protrusion superimposed on disc osteophyte complex, potentially contacting the ventral thecal sac. A second posterior disc protrusion at C4-C5 may contact the ventral thecal sac. CXR 4/15: nonacute  Micro Data:  BCx2 4/15 >  Antimicrobials:  Cefepime 4/15 > Vancomycin 4/15 >  Interim history/subjective:    Objective   Blood pressure (!) 82/51, pulse 69, temperature (!) 96.2 F (35.7 C), temperature source Rectal, resp. rate 14, SpO2 100 %.        Intake/Output Summary (Last 24 hours) at 02/02/2019 1812 Last data filed at 02/02/2019 1215 Gross per 24 hour  Intake 300 ml  Output -  Net 300 ml   There were no vitals filed for this visit.  Examination: General: thin middle aged female HENT: [place]/AT, PERRL, no appreciable JVD Lungs: Clear Cardiovascular: RRR, no MRG. +thrill on RUE fistula.  Abdomen: Soft, non-tender, non-distended Extremities: No acute deformity or ROM limitation.  Neuro: Oriented to self only.   Resolved Hospital Problem list     Assessment & Plan:   Hypotension: suspect secondary to hypovolemia, but cannot rule out sepsis - gentle IVF resuscitation given ESRD - Start low dose phenylephrine to keep MAP > 90mmHg - Lactic cleared - Suspect with time and gently hydration, this will improve.   Possible sepsis: hypothermic and leukopenic. No clear source.  - Empiric cefepime and vancomycin - Trend WBC and fever curve.  - Follow cultures.   ESRD on HD: - due for HD 4/16. Hypotension may limit this. If has urgent indication for HD, may need CRRT.  - Nephrology following.  Atrial fibrillation - continue home amiodarone  Seizure disorder - continue home keppra  Best practice:  Diet: NPO except sips with meds Pain/Anxiety/Delirium protocol  (if indicated): n/a VAP protocol (if indicated): n/a DVT prophylaxis: SQH GI prophylaxis: PPI Glucose control: n/a Mobility: BR Code Status: FULL Family Communication: Both daughters in Epic called. No response.  Disposition: ICU  Labs   CBC: Recent Labs  Lab 02/02/19 1230  WBC 3.5*  HGB 11.1*  HCT 36.3  MCV 92.8  PLT 353    Basic Metabolic Panel: Recent Labs  Lab 02/02/19 1230  NA 139  K 3.2*  CL 98  CO2 24  GLUCOSE 178*  BUN 43*  CREATININE 8.48*  CALCIUM 8.2*   GFR: CrCl cannot be calculated (Unknown ideal weight.). Recent Labs  Lab 02/02/19 1230 02/02/19 1306 02/02/19 1601  WBC 3.5*  --   --   LATICACIDVEN  --  2.6* 1.5    Liver Function Tests: Recent Labs  Lab 02/02/19 1230  AST 14*  ALT 8  ALKPHOS 32*  BILITOT 0.4  PROT 5.5*  ALBUMIN 2.9*   No results for input(s): LIPASE, AMYLASE in the last 168 hours. No results for input(s): AMMONIA in the last 168 hours.  ABG    Component Value Date/Time   PHART 7.429 11/09/2013 0335   PCO2ART 38.1 11/09/2013 0335   PO2ART 80.1 11/09/2013 0335   HCO3 24.8 (H) 11/09/2013 0335   TCO2 25.9 11/09/2013 0335   ACIDBASEDEF 4.7 (H) 03/27/2012 2300   O2SAT 95.3 11/09/2013 0335     Coagulation Profile: No results for input(s): INR, PROTIME in the last 168 hours.  Cardiac Enzymes: Recent Labs  Lab 02/02/19 1230  TROPONINI 0.06*    HbA1C: Hgb A1c MFr Bld  Date/Time Value Ref Range Status  11/09/2013 04:33 AM 5.9 (H) <5.7 % Final    Comment:    (NOTE)                                                                       According to the ADA Clinical Practice Recommendations for 2011, when HbA1c is used as a screening test:  >=6.5%   Diagnostic of Diabetes Mellitus           (if abnormal result is confirmed) 5.7-6.4%   Increased risk of developing Diabetes Mellitus References:Diagnosis and Classification of Diabetes Mellitus,Diabetes GDJM,4268,34(HDQQI 1):S62-S69 and Standards of Medical Care  in         Diabetes - 2011,Diabetes WLNL,8921,19 (Suppl 1):S11-S61.    CBG: Recent Labs  Lab 02/02/19 1239  GLUCAP 177*    Review of Systems:     Past Medical History  She,  has a past medical history of Anterior cerebral artery aneurysm, Coronary atherosclerosis of native coronary artery, Diastolic heart failure, ESRD (end stage renal disease) on dialysis ([place]), HTN (hypertension), Hypercholesterolemia, Subarachnoid hemorrhage ([place]), and Tobacco abuse.   Surgical History    Past Surgical History:  Procedure Laterality Date  . ABDOMINAL HYSTERECTOMY    . BASCILIC VEIN TRANSPOSITION Right 05/13/2013   Procedure: [place];  Surgeon: Rosetta Posner, MD;  Location: [facility];  Service: Vascular;  Laterality: Right;  . BRAIN SURGERY     2   . LOOP RECORDER IMPLANT  N/A 11/14/2013   Procedure: LOOP RECORDER IMPLANT;  Surgeon: Evans Lance, MD;  Location: [facility] CATH LAB;  Service: Cardiovascular;  Laterality: N/A;  . TEE WITHOUT CARDIOVERSION N/A 11/14/2013   Procedure: TRANSESOPHAGEAL ECHOCARDIOGRAM (TEE);  Surgeon: Candee Furbish, MD;  Location: [facility] ENDOSCOPY;  Service: Cardiovascular;  Laterality: N/A;  . VENTRICULOPERITONEAL SHUNT  03/27/2012   Procedure: SHUNT INSERTION VENTRICULAR-PERITONEAL;  Surgeon: Winfield Cunas, MD;  Location: [place] [facility] NEURO ORS;  Service: Neurosurgery;  Laterality: N/A;  Ventricular-Peritoneal Shunt Insertion     Social History   reports that she quit smoking about 22 months ago. Her smoking use included cigarettes. She has a 7.00 pack-year smoking history. She has never used smokeless tobacco. She reports that she does not drink alcohol or use drugs.   Family History   Her family history includes Coronary artery disease in an other family member; Heart disease in her father and mother.   Allergies Allergies  Allergen Reactions  . Ampicillin Hives and Rash    Has patient had a PCN reaction causing immediate rash, facial/tongue/throat swelling, SOB or  lightheadedness with hypotension: No Has patient had a PCN reaction causing severe rash involving mucus membranes or skin necrosis: No Has patient had a PCN reaction that required hospitalization: No Has patient had a PCN reaction occurring within the last 10 years: No If all of the above answers are "NO", then may proceed with Cephalosporin use.   Marland Kitchen Penicillins Hives and Rash    Has patient had a PCN reaction causing immediate rash, facial/tongue/throat swelling, SOB or lightheadedness with hypotension: Yes Has patient had a PCN reaction causing severe rash involving mucus membranes or skin necrosis: No Has patient had a PCN reaction that required hospitalization: No Has patient had a PCN reaction occurring within the last 10 years: No If all of the above answers are "NO", then may proceed with Cephalosporin use.      Home Medications  Prior to Admission medications   Medication Sig Start Date End Date Taking? Authorizing Provider  amiodarone (PACERONE) 200 MG tablet Take 2 tabs (400mg ) daily for 7 days, then 1 tab (200mg ) daily thereafter 03/24/18   Rai, Vernelle Emerald, MD  atorvastatin (LIPITOR) 40 MG tablet Take 40 mg by mouth daily.    [provider]  lactulose (CHRONULAC) 10 GM/15ML solution Take 10 g by mouth daily as needed for moderate constipation.     [provider]  levETIRAcetam (KEPPRA) 500 MG tablet Take 500 mg by mouth 2 (two) times daily.    [provider]  lidocaine-prilocaine (EMLA) cream Apply 1 application topically 3 (three) times daily. Apply prior to [facility] 02/25/18   [provider]  multivitamin (RENA-VIT) TABS tablet Take 1 tablet by mouth daily.    [provider]  nitroGLYCERIN (NITROSTAT) 0.4 MG SL tablet Place 1 tablet (0.4 mg total) under the tongue every 5 (five) minutes as needed for chest pain. 05/18/13   Janece Canterbury, MD  omeprazole (PRILOSEC) 20 MG capsule Take 20 mg by mouth daily.    [provider]   tamsulosin (FLOMAX) 0.4 MG CAPS capsule Take 0.4 mg by mouth daily.    [provider]     Critical care time: 35 mins     Georgann Housekeeper, AGACNP-BC [place] Pager [phone] or [phone]  02/02/2019 6:46 PM

## 2019-02-02 NOTE — ED Notes (Signed)
Admitting team at bedside.

## 2019-02-02 NOTE — ED Notes (Signed)
PA Joy and MD [facility] aware of pressures in the 75'Y and 51'G systolic with maps less than 65, per MD continue to slowly give boluses as ordered and monitor GCS, if pt mentating well MD Rogene [place] states he is not alarmed by the blood pressures.  MD feels pressors are not needed at this time. Will continue to monitor.

## 2019-02-02 NOTE — Progress Notes (Addendum)
MD Oletta Darter made aware that pt's SARS Coronavirus 2 resulted negative. MD said to keep pt on precautions until morning. Will continue to assess.

## 2019-02-02 NOTE — ED Triage Notes (Signed)
Pt here via [facility] from home, family heard pt fall upstairs, found her unresponsive and put her on the bed, pt denies hitting her head/pain anywhere.  Pt has been A&O x4 with EMS.  BP in the 09'F systolic with EMS.  Dialysis pt on T, TR, Sat and did go yesterday.

## 2019-02-02 NOTE — ED Notes (Addendum)
ED TO INPATIENT HANDOFF REPORT  ED Nurse Name and Phone #: Lonn Georgia [phone]  S Name/Age/Gender [name] 58 y.o. female Room/Bed: 018C/018C  Code Status   Code Status: Full Code  Home/SNF/Other Home Patient oriented to: self, place, time, situation Is this baseline? No   Triage Complete: Triage complete  Chief Complaint syncope  Triage Note Pt here via [facility] from home, family heard pt fall upstairs, found her unresponsive and put her on the bed, pt denies hitting her head/pain anywhere.  Pt has been A&O x4 with EMS.  BP in the 85'I systolic with EMS.  Dialysis pt on T, TR, Sat and did go yesterday.    Allergies Allergies  Allergen Reactions  . Ampicillin Hives and Rash    Has patient had a PCN reaction causing immediate rash, facial/tongue/throat swelling, SOB or lightheadedness with hypotension: No Has patient had a PCN reaction causing severe rash involving mucus membranes or skin necrosis: No Has patient had a PCN reaction that required hospitalization: No Has patient had a PCN reaction occurring within the last 10 years: No If all of the above answers are "NO", then may proceed with Cephalosporin use.   Marland Kitchen Penicillins Hives and Rash    Has patient had a PCN reaction causing immediate rash, facial/tongue/throat swelling, SOB or lightheadedness with hypotension: Yes Has patient had a PCN reaction causing severe rash involving mucus membranes or skin necrosis: No Has patient had a PCN reaction that required hospitalization: No Has patient had a PCN reaction occurring within the last 10 years: No If all of the above answers are "NO", then may proceed with Cephalosporin use.     Level of Care/Admitting Diagnosis ED Disposition    ED Disposition Condition [facility] Area: [place] [100100]  Level of Care: ICU [6]  Diagnosis: Hypotension [778242]  Admitting Physician: Brand Males [phone]  Attending Physician: Brand Males  [phone]  Estimated length of stay: 5 - 7 days  Certification:: I certify this patient will need inpatient services for at least 2 midnights  Possible Covid Disease Patient Isolation: Low Risk  (Less than 4L [place] supplementation)  PT Class (Do Not Modify): Inpatient [101]  PT Acc Code (Do Not Modify): Private [1]       B Medical/Surgery History Past Medical History:  Diagnosis Date  . Anterior cerebral artery aneurysm    1996 AT [facility]  . Coronary atherosclerosis of native coronary artery    STEMI Feb 2009 - 70% distal LAD lesion c/w plaque rupture, DES distal RCA  . Diastolic heart failure    LVEF 60-65% with grade 2 diastolic dysfunction - July 2014  . ESRD (end stage renal disease) on dialysis ([place])   . HTN (hypertension)    Resistant  . Hypercholesterolemia   . Subarachnoid hemorrhage ([place])    03/2012  . Tobacco abuse    Resumed smoking half a pack a day. She was a smoker in the past and states she was abl eto stop in the past using nicotine patches   Past Surgical History:  Procedure Laterality Date  . ABDOMINAL HYSTERECTOMY    . BASCILIC VEIN TRANSPOSITION Right 05/13/2013   Procedure: [place];  Surgeon: Rosetta Posner, MD;  Location: [place];  Service: Vascular;  Laterality: Right;  . BRAIN SURGERY     2   . LOOP RECORDER IMPLANT N/A 11/14/2013   Procedure: LOOP RECORDER IMPLANT;  Surgeon: Evans Lance, MD;  Location: [facility] CATH LAB;  Service: Cardiovascular;  Laterality: N/A;  . TEE WITHOUT CARDIOVERSION N/A 11/14/2013   Procedure: TRANSESOPHAGEAL ECHOCARDIOGRAM (TEE);  Surgeon: Candee Furbish, MD;  Location: [facility] ENDOSCOPY;  Service: Cardiovascular;  Laterality: N/A;  . VENTRICULOPERITONEAL SHUNT  03/27/2012   Procedure: SHUNT INSERTION VENTRICULAR-PERITONEAL;  Surgeon: Winfield Cunas, MD;  Location: [place] NEURO ORS;  Service: Neurosurgery;  Laterality: N/A;  Ventricular-Peritoneal Shunt Insertion     A IV Location/Drains/Wounds Patient Lines/Drains/Airways Status    Active Line/Drains/Airways    Name:   Placement date:   Placement time:   Site:   Days:   Peripheral IV 02/02/19 Arm   02/02/19    1218    Arm   less than 1   Peripheral IV 02/02/19 Left Hand   02/02/19    1345    Hand   less than 1   Fistula / Graft Right Upper arm Arteriovenous fistula   05/13/13    1019    Upper arm   2091   External Urinary Catheter   02/02/19    1241    -   less than 1          Intake/Output Last 24 hours  Intake/Output Summary (Last 24 hours) at 02/02/2019 1841 Last data filed at 02/02/2019 1215 Gross per 24 hour  Intake 300 ml  Output -  Net 300 ml    Labs/Imaging Results for orders placed or performed during the hospital encounter of 02/02/19 (from the past 48 hour(s))  CBC     Status: Abnormal   Collection Time: 02/02/19 12:30 PM  Result Value Ref Range   WBC 3.5 (L) 4.0 - 10.5 K/uL   RBC 3.91 3.87 - 5.11 MIL/uL   Hemoglobin 11.1 (L) 12.0 - 15.0 g/dL   HCT 36.3 36.0 - 46.0 %   MCV 92.8 80.0 - 100.0 fL   MCH 28.4 26.0 - 34.0 pg   MCHC 30.6 30.0 - 36.0 g/dL   RDW 17.4 (H) 11.5 - 15.5 %   Platelets 162 150 - 400 K/uL   nRBC 0.0 0.0 - 0.2 %    Comment: Performed at [facility], [address], [place], [place] [address]  Comprehensive metabolic panel     Status: Abnormal   Collection Time: 02/02/19 12:30 PM  Result Value Ref Range   Sodium 139 135 - 145 mmol/L   Potassium 3.2 (L) 3.5 - 5.1 mmol/L   Chloride 98 98 - 111 mmol/L   CO2 24 22 - 32 mmol/L   Glucose, Bld 178 (H) 70 - 99 mg/dL   BUN 43 (H) 6 - 20 mg/dL   Creatinine, Ser 8.48 (H) 0.44 - 1.00 mg/dL   Calcium 8.2 (L) 8.9 - 10.3 mg/dL   Total Protein 5.5 (L) 6.5 - 8.1 g/dL   Albumin 2.9 (L) 3.5 - 5.0 g/dL   AST 14 (L) 15 - 41 U/L   ALT 8 0 - 44 U/L   Alkaline Phosphatase 32 (L) 38 - 126 U/L   Total Bilirubin 0.4 0.3 - 1.2 mg/dL   GFR calc non Af Amer 5 (L) >60 mL/min   GFR calc Af Amer 6 (L) >60 mL/min   Anion gap 17 (H) 5 - 15    Comment: Performed at [facility],  [place] [address], [place], [place] [address]  Troponin I - ONCE - STAT     Status: Abnormal   Collection Time: 02/02/19 12:30 PM  Result Value Ref Range   Troponin I 0.06 (  HH) <0.03 ng/mL    Comment: CRITICAL RESULT CALLED TO, READ BACK BY AND VERIFIED WITH: Sukhdeep Wieting,K RN @ 1610 02/02/19 LEONARD,A Performed at [facility], [place] [address] [place], [place] [address]   Ethanol     Status: None   Collection Time: 02/02/19 12:30 PM  Result Value Ref Range   Alcohol, Ethyl (B) <10 <10 mg/dL    Comment: (NOTE) Lowest detectable limit for serum alcohol is 10 mg/dL. For medical purposes only. Performed at [facility], [place] [address], [place], [place] [address]   CBG monitoring, ED     Status: Abnormal   Collection Time: 02/02/19 12:39 PM  Result Value Ref Range   Glucose-Capillary 177 (H) 70 - 99 mg/dL  Lactic acid, plasma     Status: Abnormal   Collection Time: 02/02/19  1:06 PM  Result Value Ref Range   Lactic Acid, Venous 2.6 (HH) 0.5 - 1.9 mmol/L    Comment: CRITICAL RESULT CALLED TO, READ BACK BY AND VERIFIED WITH: K Lakiesha Ralphs @ 1440 02/02/19 BY S GEZAHEGN Performed at [facility], [place] [address], [place], [place] [address]   POC occult blood, ED     Status: None   Collection Time: 02/02/19  1:10 PM  Result Value Ref Range   Fecal Occult Bld NEGATIVE NEGATIVE  Lactic acid, plasma     Status: None   Collection Time: 02/02/19  4:01 PM  Result Value Ref Range   Lactic Acid, Venous 1.5 0.5 - 1.9 mmol/L    Comment: Performed at [place] [address], [place], [place] [address]   Ct Head Wo Contrast  Result Date: 02/02/2019 CLINICAL DATA:  58 year old female with a history of unresponsive EXAM: CT HEAD WITHOUT CONTRAST CT CERVICAL SPINE WITHOUT CONTRAST TECHNIQUE: Multidetector CT imaging of the head and cervical spine was performed following the standard protocol without intravenous contrast. Multiplanar CT image reconstructions of the cervical spine were also  generated. COMPARISON:  02/16/2018 FINDINGS: CT HEAD FINDINGS Brain: No acute intracranial hemorrhage. No midline shift or mass effect. Gray-white differentiation maintained. Encephalomalacia of the left parietal and posterior temporal region. Coil mass in the basilar tip, unchanged. Right frontal approach ventriculostomy terminates just beyond the midline, unchanged. 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 Calcifications of the intracranial vasculature. Skull: No displaced fracture. Surgical changes of the right pterional craniotomy. Shunt tubing within the scalp soft tissues. Sinuses/Orbits: No acute finding. Other: None CT CERVICAL SPINE FINDINGS Alignment: Craniocervical junction aligned. Anatomic alignment of the cervical elements. No subluxation. Skull base and vertebrae: No acute fracture at the skullbase. Vertebral body heights relatively maintained. No acute fracture identified. Soft tissues and spinal canal: Unremarkable cervical soft tissues. Lymph nodes are present, though not enlarged. Disc levels: Disc disease at C5-C6 with posterior disc osteophyte complex and bilateral uncovertebral joint disease contributing to bilateral left greater than right foraminal narrowing. Focal central disc protrusion at C4-C5, may contact the ventral thecal sac. Focal central disc protrusion at C5-C6 superimposed on disc osteophyte complex, may contact the ventral thecal sac. Upper chest: Unremarkable appearance of the lung apices. Other: Cystic nodule of the left thyroid. Vascular calcifications of bilateral carotid region. IMPRESSION: Head CT: No acute intracranial abnormality. Redemonstration of encephalomalacia of the left posterior temporal and parietal region. Redemonstration of prior treatment of right MCA, right paraclinoid, and basilar aneurysms. Unchanged position of right frontal approach ventriculostomy. Cervical CT: No  acute fracture or malalignment of the cervical spine. Degenerative disc disease worst at the C5-C6 level, with focal posterior disc protrusion superimposed on disc osteophyte complex, potentially contacting the ventral thecal sac. A second posterior disc protrusion at C4-C5 may contact the ventral thecal sac. Electronically Signed   By: Corrie Mckusick D.O.   On: 02/02/2019 16:32   Ct Cervical Spine Wo Contrast  Result Date: 02/02/2019 CLINICAL DATA:  58 year old female with a history of unresponsive EXAM: CT HEAD WITHOUT CONTRAST CT CERVICAL SPINE WITHOUT CONTRAST TECHNIQUE: Multidetector CT imaging of the head and cervical spine was performed following the standard protocol without intravenous contrast. Multiplanar CT image reconstructions of the cervical spine were also generated. COMPARISON:  02/16/2018 FINDINGS: CT HEAD FINDINGS Brain: No acute intracranial hemorrhage. No midline shift or mass effect. Gray-white differentiation maintained. Encephalomalacia of the left parietal and posterior temporal region. Coil mass in the basilar tip, unchanged. Right frontal approach ventriculostomy terminates just beyond the midline, unchanged. 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 Calcifications of the intracranial vasculature. Skull: No displaced fracture. Surgical changes of the right pterional craniotomy. Shunt tubing within the scalp soft tissues. Sinuses/Orbits: No acute finding. Other: None CT CERVICAL SPINE FINDINGS Alignment: Craniocervical junction aligned. Anatomic alignment of the cervical elements. No subluxation. Skull base and vertebrae: No acute fracture at the skullbase. Vertebral body heights relatively maintained. No acute fracture identified. Soft tissues and spinal canal: Unremarkable cervical soft tissues. Lymph nodes are present, though not enlarged. Disc levels: Disc disease at C5-C6 with posterior disc  osteophyte complex and bilateral uncovertebral joint disease contributing to bilateral left greater than right foraminal narrowing. Focal central disc protrusion at C4-C5, may contact the ventral thecal sac. Focal central disc protrusion at C5-C6 superimposed on disc osteophyte complex, may contact the ventral thecal sac. Upper chest: Unremarkable appearance of the lung apices. Other: Cystic nodule of the left thyroid. Vascular calcifications of bilateral carotid region. IMPRESSION: Head CT: No acute intracranial abnormality. Redemonstration of encephalomalacia of the left posterior temporal and parietal region. Redemonstration of prior treatment of right MCA, right paraclinoid, and basilar aneurysms. Unchanged position of right frontal approach ventriculostomy. Cervical CT: No acute fracture or malalignment of the cervical spine. Degenerative disc disease worst at the C5-C6 level, with focal posterior disc protrusion superimposed on disc osteophyte complex, potentially contacting the ventral thecal sac. A second posterior disc protrusion at C4-C5 may contact the ventral thecal sac. Electronically Signed   By: Corrie Mckusick D.O.   On: 02/02/2019 16:32   Dg Chest Portable 1 View  Result Date: 02/02/2019 CLINICAL DATA:  Syncope.  Former smoker. EXAM: PORTABLE CHEST 1 VIEW COMPARISON:  03/20/2018 FINDINGS: Catheter extending over the right neck, chest and abdomen likely ventriculostomy catheter and unchanged. Lungs are adequately inflated without consolidation or effusion. Cardiomediastinal silhouette and remainder of the exam is unchanged. IMPRESSION: No active disease. Electronically Signed   By: Marin Olp M.D.   On: 02/02/2019 13:39    Pending Labs Unresulted Labs (From admission, onward)    Start     Ordered   02/03/19 0500  Renal function panel  Tomorrow morning,   R     02/02/19 1725   02/03/19 0500  CBC  Tomorrow morning,   R     02/02/19 1837   02/03/19 5852  Basic metabolic panel  Tomorrow  morning,   R     02/02/19 1837   02/03/19 0500  Magnesium  Tomorrow morning,   R     02/02/19 1837   02/03/19 0500  Phosphorus  Tomorrow morning,   R     02/02/19 1837   02/02/19 1644  SARS Coronavirus 2 [facility] order, Performed in [facility] hospital lab)  (Novel Coronavirus, [facility] Order) with precautions panel)  Once,   R     02/02/19 1643   02/02/19 1306  Blood Culture (routine x 2)  BLOOD CULTURE X 2,   STAT     02/02/19 1307   02/02/19 1229  Rapid urine drug screen (hospital performed)  ONCE - STAT,   R     02/02/19 1228   02/02/19 1229  Urinalysis, Routine w reflex microscopic  Once,   R     02/02/19 1228   Signed and Held  Renal function panel  Tomorrow morning,   R     Signed and Held   Signed and Held  CBC  Tomorrow morning,   R     Signed and Held          Vitals/Pain Today's Vitals   02/02/19 1630 02/02/19 1700 02/02/19 1715 02/02/19 1730  BP: (!) 89/48 (!) 86/50 (!) 87/49 (!) 82/51  Pulse: 65 65 67 69  Resp: (!) 21 13 13 14   Temp:      TempSrc:      SpO2: 100% 100% 100% 100%  PainSc:        Isolation Precautions Droplet and Contact precautions  Medications Medications  vancomycin (VANCOCIN) IVPB 500 mg/100 ml premix (has no administration in time range)  potassium chloride SA (K-DUR,KLOR-CON) CR tablet 40 mEq (has no administration in time range)  Chlorhexidine Gluconate Cloth 2 % PADS 6 each (has no administration in time range)  calcitRIOL (ROCALTROL) capsule 0.5 mcg (has no administration in time range)  cinacalcet (SENSIPAR) tablet 60 mg (has no administration in time range)  calcium acetate (PHOSLO) capsule 1,334 mg (has no administration in time range)  sucroferric oxyhydroxide (VELPHORO) chewable tablet 1,000 mg (has no administration in time range)  heparin injection 5,000 Units (has no administration in time range)  pantoprazole (PROTONIX) injection 40 mg (has no administration in time range)  0.9 %  sodium chloride infusion (has no  administration in time range)  phenylephrine (NEOSYNEPHRINE) 10-0.9 MG/250ML-% infusion (has no administration in time range)  levETIRAcetam (KEPPRA) IVPB 500 mg/100 mL premix (has no administration in time range)  amiodarone (PACERONE) tablet 200 mg (has no administration in time range)  sodium chloride 0.9 % bolus 250 mL (0 mLs Intravenous Stopped 02/02/19 1301)  metroNIDAZOLE (FLAGYL) IVPB 500 mg (0 mg Intravenous Stopped 02/02/19 1454)  sodium chloride 0.9 % bolus 1,000 mL (0 mLs Intravenous Stopped 02/02/19 1604)  ceFEPIme (MAXIPIME) 2 g in sodium chloride 0.9 % 100 mL IVPB (0 g Intravenous Stopped 02/02/19 1541)  vancomycin (VANCOCIN) IVPB 1000 mg/200 mL premix (0 mg Intravenous Stopped 02/02/19 1731)  sodium chloride 0.9 % bolus 500 mL (0 mLs Intravenous Stopped 02/02/19 1731)  sodium chloride 0.9 % bolus 500 mL (500 mLs Intravenous New Bag/Given 02/02/19 1738)    Mobility walks with person assist Moderate fall risk   Focused Assessments Cardiac Assessment Handoff:    Lab Results  Component Value Date   CKTOTAL 50 04/30/2012   CKMB 2.4 04/30/2012   TROPONINI 0.06 (HH) 02/02/2019   Lab Results  Component Value Date   DDIMER  12/16/2007    0.43        AT THE INHOUSE ESTABLISHED  CUTOFF VALUE OF 0.48 ug/mL FEU, THIS ASSAY HAS BEEN DOCUMENTED IN THE LITERATURE TO HAVE   Does the Patient currently have chest pain? No     R Recommendations: See Admitting Provider Note  Report given to:   Additional Notes:

## 2019-02-02 NOTE — ED Notes (Addendum)
Per MD Zackowski, pressures in the 60's are okay as long as pt is mentating well, per MD keep monitoring and we will give fluids slowly.

## 2019-02-02 NOTE — Progress Notes (Signed)
Pharmacy Antibiotic Note  [name] is a 58 y.o. female admitted on 02/02/2019 with sepsis.  Pharmacy has been consulted for vancomycin dosing. Pt is hypothermic and WBC is low. Pt with history of ESRD on HD. Pt initially ordered aztreonam as well but this was changed to cefepime since she has tolerated cephalosporins in the past.   Plan: Vancomycin 1gm IV x 1 then 500mg  post-HD F/u renal plans, C&S, clinical status and per-HD vanc level    Temp (24hrs), Avg:96.8 F (36 C), Min:96.2 F (35.7 C), Max:97.4 F (36.3 C)  Recent Labs  Lab 02/02/19 1230  WBC 3.5*    CrCl cannot be calculated (Patient's most recent lab result is older than the maximum 21 days allowed.).    Allergies  Allergen Reactions  . Ampicillin Hives and Rash    Has patient had a PCN reaction causing immediate rash, facial/tongue/throat swelling, SOB or lightheadedness with hypotension: No Has patient had a PCN reaction causing severe rash involving mucus membranes or skin necrosis: No Has patient had a PCN reaction that required hospitalization: No Has patient had a PCN reaction occurring within the last 10 years: No If all of the above answers are "NO", then may proceed with Cephalosporin use.   Marland Kitchen Penicillins Hives and Rash    Has patient had a PCN reaction causing immediate rash, facial/tongue/throat swelling, SOB or lightheadedness with hypotension: Yes Has patient had a PCN reaction causing severe rash involving mucus membranes or skin necrosis: No Has patient had a PCN reaction that required hospitalization: No Has patient had a PCN reaction occurring within the last 10 years: No If all of the above answers are "NO", then may proceed with Cephalosporin use.     Antimicrobials this admission: Vanc 4/15>> Cefepime x 1 4/15 Flagyl x 1 4/15  Dose adjustments this admission: N/A  Microbiology results: Pending  Thank you for allowing pharmacy to be a part of this patient's care.  [name], [name] 02/02/2019 1:17 PM

## 2019-02-02 NOTE — ED Notes (Signed)
Bladder scanner showed 0 ml, pt declining in and out cath.

## 2019-02-02 NOTE — ED Notes (Signed)
Daughter: [phone]

## 2019-02-03 ENCOUNTER — Encounter (HOSPITAL_COMMUNITY): Payer: Self-pay | Admitting: Internal Medicine

## 2019-02-03 DIAGNOSIS — I4891 Unspecified atrial fibrillation: Secondary | ICD-10-CM

## 2019-02-03 DIAGNOSIS — I10 Essential (primary) hypertension: Secondary | ICD-10-CM

## 2019-02-03 DIAGNOSIS — Z992 Dependence on renal dialysis: Secondary | ICD-10-CM

## 2019-02-03 DIAGNOSIS — A021 Salmonella sepsis: Secondary | ICD-10-CM

## 2019-02-03 DIAGNOSIS — I480 Paroxysmal atrial fibrillation: Secondary | ICD-10-CM

## 2019-02-03 DIAGNOSIS — I950 Idiopathic hypotension: Secondary | ICD-10-CM

## 2019-02-03 DIAGNOSIS — N186 End stage renal disease: Secondary | ICD-10-CM

## 2019-02-03 DIAGNOSIS — A419 Sepsis, unspecified organism: Secondary | ICD-10-CM

## 2019-02-03 HISTORY — DX: End stage renal disease: N18.6

## 2019-02-03 HISTORY — DX: Paroxysmal atrial fibrillation: I48.0

## 2019-02-03 LAB — RENAL FUNCTION PANEL
Albumin: 2.7 g/dL — ABNORMAL LOW (ref 3.5–5.0)
Anion gap: 17 — ABNORMAL HIGH (ref 5–15)
BUN: 49 mg/dL — ABNORMAL HIGH (ref 6–20)
CO2: 21 mmol/L — ABNORMAL LOW (ref 22–32)
Calcium: 8.6 mg/dL — ABNORMAL LOW (ref 8.9–10.3)
Chloride: 105 mmol/L (ref 98–111)
Creatinine, Ser: 9.43 mg/dL — ABNORMAL HIGH (ref 0.44–1.00)
GFR calc Af Amer: 5 mL/min — ABNORMAL LOW (ref >60)
GFR calc non Af Amer: 4 mL/min — ABNORMAL LOW (ref >60)
Glucose, Bld: 85 mg/dL (ref 70–99)
Phosphorus: 7.7 mg/dL — ABNORMAL HIGH (ref 2.5–4.6)
Potassium: 4.9 mmol/L (ref 3.5–5.1)
Sodium: 143 mmol/L (ref 135–145)

## 2019-02-03 LAB — CBC
HCT: 31 % — ABNORMAL LOW (ref 36.0–46.0)
Hemoglobin: 9.9 g/dL — ABNORMAL LOW (ref 12.0–15.0)
MCH: 29.7 pg (ref 26.0–34.0)
MCHC: 31.9 g/dL (ref 30.0–36.0)
MCV: 93.1 fL (ref 80.0–100.0)
Platelets: 144 10*3/uL — ABNORMAL LOW (ref 150–400)
RBC: 3.33 MIL/uL — ABNORMAL LOW (ref 3.87–5.11)
RDW: 17.7 % — ABNORMAL HIGH (ref 11.5–15.5)
WBC: 3.8 10*3/uL — ABNORMAL LOW (ref 4.0–10.5)
nRBC: 0 % (ref 0.0–0.2)

## 2019-02-03 LAB — MAGNESIUM: Magnesium: 1.9 mg/dL (ref 1.7–2.4)

## 2019-02-03 MED ORDER — LEVETIRACETAM 500 MG PO TABS
500.0000 mg | ORAL_TABLET | Freq: Two times a day (BID) | ORAL | Status: DC
  Administered 2019-02-03 – 2019-02-04 (×2): 500 mg via ORAL
  Filled 2019-02-03 (×3): qty 1

## 2019-02-03 MED ORDER — HEPARIN SODIUM (PORCINE) 1000 UNIT/ML DIALYSIS: 1000.0000 [IU] | INTRAMUSCULAR | Status: DC | PRN

## 2019-02-03 MED ORDER — LIDOCAINE-PRILOCAINE 2.5-2.5 % EX CREA: 1.0000 "application " | TOPICAL_CREAM | CUTANEOUS | Status: DC | PRN

## 2019-02-03 MED ORDER — LIDOCAINE HCL (PF) 1 % IJ SOLN: 5.0000 mL | INTRAMUSCULAR | Status: DC | PRN

## 2019-02-03 MED ORDER — PANTOPRAZOLE SODIUM 40 MG PO TBEC
40.0000 mg | DELAYED_RELEASE_TABLET | Freq: Every day | ORAL | Status: DC
  Administered 2019-02-03: 40 mg via ORAL
  Filled 2019-02-03: qty 1

## 2019-02-03 MED ORDER — SODIUM CHLORIDE 0.9 % IV SOLN: 100.0000 mL | INTRAVENOUS | Status: DC | PRN

## 2019-02-03 MED ORDER — PENTAFLUOROPROP-TETRAFLUOROETH EX AERO: 1.0000 "application " | INHALATION_SPRAY | CUTANEOUS | Status: DC | PRN

## 2019-02-03 NOTE — Progress Notes (Signed)
[facility] Progress Note  Subjective: alert but confused, not sure baseline.  Better than yesterday. BP's up 120's.  Looks stronger.   Vitals:   02/03/19 1400 02/03/19 1430 02/03/19 1500 02/03/19 1530  BP: (!) 110/59 (!) 108/55 112/60 121/63  Pulse: 67 66 67 66  Resp: (!) 27     Temp:      TempSrc:      SpO2:      Weight:        Inpatient medications: . amiodarone  200 mg Oral Daily  . calcitRIOL  0.5 mcg Oral Q T,Th,Sa-HD  . calcium acetate  1,334 mg Oral TID WC  . Chlorhexidine Gluconate Cloth  6 each Topical Q0600  . cinacalcet  60 mg Oral Q T,Th,Sa-HD  . heparin  5,000 Units Subcutaneous Q8H  . levETIRAcetam  500 mg Oral BID  . pantoprazole  40 mg Oral Daily  . sucroferric oxyhydroxide  1,000 mg Oral TID WC   . sodium chloride    . sodium chloride    . sodium chloride     sodium chloride, sodium chloride, heparin, lidocaine (PF), lidocaine-prilocaine, pentafluoroprop-tetrafluoroeth    Exam: General: Chronically ill cachetic appearing female no acute distress. Looks older than stated age. Head: Normocephalic, atraumatic, sclera non-icteric, mucus membranes are moist Neck: Supple. JVD not elevated. Lungs: Clear bilaterally to auscultation without wheezes, rales, or rhonchi. Breathing is unlabored. Heart: RRR with S1 S2. 2/6 systolic M. No R/G. SR on monitor. HR 60s.  Abdomen: Soft, non-tender, non-distended with normoactive bowel sounds. No rebound/guarding. No obvious abdominal masses. M-S:  Strength and tone appear normal for age. Lower extremities:without edema or ischemic changes, no open wounds  Neuro: Alert and oriented X 2. Moves all extremities spontaneously. Psych:  Lethargic, opens eyes to verbal stimuli-will answer questions then closes eyes.  Dialysis Access: RUA AVF + T/B  Dialysis Orders: [facility] T,Th, S 4 hrs 180NRe 400/800 41.5 kg 2.0 K/ 2.0 Ca  R AVF -No heparin -No ESA/Venofer -Sensipar 60 mg PO TIW (last PTH 289  01/27/19) -Calcitriol 0.5 mcg PO TIW   BMD meds: Calcium acetate 667 mg 2 capsules PO TIW and 1 capsule w/snack Velphoro 500 mg 3 tabs PO TID AC (last Ca 9.8 Phos 7.1 01/27/19)  Assessment/Plan: 1.  Syncope / hypotension - possible Sepsis vs dehydration. Blood cx's negative, no fevers,temps in ED were low but now normal after fluid resuscitation. CXR neg, no resp symptoms.  BP's up to normal.  Getting IV emp abx. Covid was negative.  2.  ESRD - TTS HD.  HD today. No fluid off. Next HD Sat.   3.  Hypotension/volume -  No antihypertensive meds on OP med list. Doubt wt's are accurate. No UF w/ HD for now.  4.  Anemia  - HGB 11.1 No ESA needed. Follow HGB 5.  Metabolic bone disease - Ca 8.2 C Ca 9.2. Continue binders, sensipar VDRA when eating  6.  Nutrition - NPO at present. Albumin 2.9. Renal diet/renal vits/prostat when eating.  7.  H/O Afib RVR-On amiodarone-no anticoagulation  8.  H/O SAH 9. H/O seizure disorder on keppra 10. H/O CAD remote history of  PTCI to RCA  11.  HFpEF-Grade 1 diastolic dysfunction-EF 65-78% 03/23/2018      [facility] 02/03/2019, 3:48 PM  Iron/TIBC/Ferritin/ %Sat    Component Value Date/Time   IRON 51 05/07/2013 1339   TIBC 228 (L) 05/07/2013 1339   IRONPCTSAT 22 05/07/2013 1339   Recent Labs  Lab 02/03/19 0652  NA 143  K 4.9  CL 105  CO2 21*  GLUCOSE 85  BUN 49*  CREATININE 9.43*  CALCIUM 8.6*  PHOS 7.7*  ALBUMIN 2.7*   Recent Labs  Lab 02/02/19 1230  AST 14*  ALT 8  ALKPHOS 32*  BILITOT 0.4  PROT 5.5*   Recent Labs  Lab 02/03/19 0652  WBC 3.8*  HGB 9.9*  HCT 31.0*  PLT 144*

## 2019-02-03 NOTE — Progress Notes (Signed)
Report called to 41[facility]. All questions answered. 41M RN asked that we wait to transport once a low bed was delivered.  2025-Low bed delivered. Pt transported to 41M.

## 2019-02-03 NOTE — Progress Notes (Signed)
Dialysis Coordinator notified OP HD Clinic Manager of patient's negative COVID 19 result for continuity of care.  Alphonzo Cruise Dialysis Coordinator [phone]

## 2019-02-03 NOTE — Progress Notes (Signed)
PROGRESS NOTE    Tamia Dial  AOZ:[id number] DOB: [birth year] DOA: 02/02/2019 PCP: Garwin Brothers, MD   Brief Narrative:  Emsley Custer is 58 yo BF PMHx anterior cerebral artery aneurysm, subarachnoid hemorrhage, CAD, chronic diastolic CHF, HTN, HLD, tobacco abuse, ESRD on HD T/Th/Sat , - s/p HD yesterday and drier than usual.   02/02/2019 had syncope with low BP and taken to ER. In ER low temp + high lacttate and low WBC (baseline 5). Being ruled out for covid by renal but not resp or flu like symptoms    Subjective: 4/16 A/O x1 (does not know where, when, why).  States she has missed multiple HD sessions however no indication that she is missed HD sessions in fact admitted supposedly under her dry weight.  Does not know dry weight.   Assessment & Plan:   Active Problems:   Seizure disorder (HCC)   HTN (hypertension)   Hypotension   Sepsis, unspecified organism ([place])   End-stage renal disease on hemodialysis ([place])   Unspecified atrial fibrillation (HCC)   Essential hypertension -Amiodarone 200 mg daily starting today.  Initially held secondary to hypotensive on admission  Hypotensive -Believe secondary to hypovolemia vs sepsis - Currently BP controlled on home medication and HD  Unspecified atrial fibrillation -See hypertension  Sepsis unspecified organism -Lactic acid resolved with hydration - Discontinue empiric antibiotics -Will follow cultures if cultures positive, patient spikes fever or increasing WBCs will restart antibiotics.  But do not believe true infection   ESRD on HDT/Th/Sat -HD per nephrology  -HD scheduled for today     Seizure disorder -Keppra 500 mg twice daily - Given patient's significant dementia/mental status change will order EEG. -        Best practice:  Diet: NPO except sips with meds Pain/Anxiety/Delirium protocol (if indicated): n/a VAP protocol (if indicated): n/a DVT prophylaxis: SQH GI prophylaxis: PPI Glucose control:  n/a Mobility: BR Code Status: FULL Family Communication: Both daughters in Epic called. No response.  Disposition: ICU     DVT prophylaxis: Heparin Code Status: Full Family Communication: None Disposition Plan: TBD   Consultants:  Nephrology   Procedures/Significant Events:  4/15 CT head/CT C-spine:-No acute intracranial abnormality. -redemonstration of encephalomalacia of the left posterior temporaland parietal region. -Redemonstration of prior treatment of right MCA, right paraclinoid,and basilar aneurysms. -Unchanged position of right frontal approach ventriculostomy. -Negative acute fracture or malalignment of the cervical spine.  - DDD worst at the C5-C6 level, with focal posterior disc protrusion superimposed on disc osteophyte complex, potentially contacting the ventral thecal sac.  -second posterior disc protrusion at C4-C5 may contact the ventral thecal sac.    I have personally reviewed and interpreted all radiology studies and my findings are as above.  VENTILATOR SETTINGS: None   Cultures 4/15 SARS coronavirus: Negative 4/15 blood LEFT wrist NGTD  4/15 blood left hand NGTD    Antimicrobials: Anti-infectives (From admission, onward)   Start     Stop   02/03/19 1300  ceFEPIme (MAXIPIME) 1 g in sodium chloride 0.9 % 100 mL IVPB  Status:  Discontinued     02/02/19 1845   02/03/19 1200  vancomycin (VANCOCIN) IVPB 500 mg/100 ml premix  Status:  Discontinued     02/03/19 1232   02/03/19 1200  ceFEPIme (MAXIPIME) 2 g in sodium chloride 0.9 % 100 mL IVPB  Status:  Discontinued     02/03/19 1543   02/02/19 1330  ceFEPIme (MAXIPIME) 2 g in sodium chloride 0.9 % 100 mL IVPB  02/02/19 1541   02/02/19 1330  vancomycin (VANCOCIN) IVPB 1000 mg/200 mL premix     02/02/19 1731   02/02/19 1315  metroNIDAZOLE (FLAGYL) IVPB 500 mg     02/02/19 1454       Devices    LINES / TUBES:      Continuous Infusions: . sodium chloride    . sodium chloride    .  sodium chloride    . ceFEPime (MAXIPIME) IV    . phenylephrine (NEO-SYNEPHRINE) Adult infusion Stopped (02/02/19 2333)     Objective: Vitals:   02/03/19 1400 02/03/19 1430 02/03/19 1500 02/03/19 1530  BP: (!) 110/59 (!) 108/55 112/60 121/63  Pulse: 67 66 67 66  Resp: (!) 27     Temp:      TempSrc:      SpO2:      Weight:        Intake/Output Summary (Last 24 hours) at 02/03/2019 1535 Last data filed at 02/03/2019 1300 Gross per 24 hour  Intake 535.51 ml  Output 0 ml  Net 535.51 ml   Filed Weights   02/02/19 2005 02/03/19 1300  Weight: 45.1 kg 46 kg    Examination:  General: A/O x1 (does not know where, when, why) no acute respiratory distress Eyes: negative scleral hemorrhage, negative anisocoria, negative icterus ENT: Negative Runny nose, negative gingival bleeding, Neck:  Negative scars, masses, torticollis, lymphadenopathy, JVD Lungs: Clear to auscultation bilaterally without wheezes or crackles Cardiovascular: Regular rate and rhythm without murmur gallop or rub normal S1 and S2 Abdomen: negative abdominal pain, nondistended, positive soft, bowel sounds, no rebound, no ascites, no appreciable mass Extremities: No significant cyanosis, clubbing, or edema bilateral lower extremities Skin: Negative rashes, lesions, ulcers Psychiatric:  Negative depression, negative anxiety, negative fatigue, negative mania  Central nervous system:  Cranial nerves II through XII intact, tongue/uvula midline, all extremities muscle strength 5/5, sensation intact throughout,negative dysarthria, negative expressive aphasia, negative receptive aphasia.  .     Data Reviewed: Care during the described time interval was provided by me .  I have reviewed this patient's available data, including medical history, events of note, physical examination, and all test results as part of my evaluation.   CBC: Recent Labs  Lab 02/02/19 1230 02/03/19 0652  WBC 3.5* 3.8*  HGB 11.1* 9.9*  HCT 36.3  31.0*  MCV 92.8 93.1  PLT 162 256*   Basic Metabolic Panel: Recent Labs  Lab 02/02/19 1230 02/03/19 0652  NA 139 143  K 3.2* 4.9  CL 98 105  CO2 24 21*  GLUCOSE 178* 85  BUN 43* 49*  CREATININE 8.48* 9.43*  CALCIUM 8.2* 8.6*  MG  --  1.9  PHOS  --  7.7*   GFR: CrCl cannot be calculated (Unknown ideal weight.). Liver Function Tests: Recent Labs  Lab 02/02/19 1230 02/03/19 0652  AST 14*  --   ALT 8  --   ALKPHOS 32*  --   BILITOT 0.4  --   PROT 5.5*  --   ALBUMIN 2.9* 2.7*   No results for input(s): LIPASE, AMYLASE in the last 168 hours. No results for input(s): AMMONIA in the last 168 hours. Coagulation Profile: No results for input(s): INR, PROTIME in the last 168 hours. Cardiac Enzymes: Recent Labs  Lab 02/02/19 1230  TROPONINI 0.06*   BNP (last 3 results) No results for input(s): PROBNP in the last 8760 hours. HbA1C: No results for input(s): HGBA1C in the last 72 hours. CBG: Recent Labs  Lab  02/02/19 1239  GLUCAP 177*   Lipid Profile: No results for input(s): CHOL, HDL, LDLCALC, TRIG, CHOLHDL, LDLDIRECT in the last 72 hours. Thyroid Function Tests: No results for input(s): TSH, T4TOTAL, FREET4, T3FREE, THYROIDAB in the last 72 hours. Anemia Panel: No results for input(s): VITAMINB12, FOLATE, FERRITIN, TIBC, IRON, RETICCTPCT in the last 72 hours. Urine analysis:    Component Value Date/Time   COLORURINE YELLOW 11/08/2013 [place] 11/08/2013 1304   LABSPEC 1.009 11/08/2013 1304   PHURINE 7.5 11/08/2013 1304   GLUCOSEU NEGATIVE 11/08/2013 1304   HGBUR NEGATIVE 11/08/2013 1304   BILIRUBINUR NEGATIVE 11/08/2013 1304   KETONESUR NEGATIVE 11/08/2013 1304   PROTEINUR NEGATIVE 11/08/2013 1304   UROBILINOGEN 0.2 11/08/2013 1304   NITRITE NEGATIVE 11/08/2013 1304   LEUKOCYTESUR NEGATIVE 11/08/2013 1304   Sepsis Labs: @LABRCNTIP (procalcitonin:4,lacticidven:4)  ) Recent Results (from the past 240 hour(s))  Blood Culture (routine x  2)     Status: None (Preliminary result)   Collection Time: 02/02/19  1:05 PM  Result Value Ref Range Status   Specimen Description BLOOD LEFT WRIST  Final   Special Requests   Final    BOTTLES DRAWN AEROBIC AND ANAEROBIC Blood Culture results may not be optimal due to an inadequate volume of blood received in culture bottles   Culture   Final    NO GROWTH 1 DAY Performed at [facility], [place] [address], [place], [place] [address]    Report Status PENDING  Incomplete  Blood Culture (routine x 2)     Status: None (Preliminary result)   Collection Time: 02/02/19  1:47 PM  Result Value Ref Range Status   Specimen Description BLOOD LEFT HAND  Final   Special Requests   Final    BOTTLES DRAWN AEROBIC AND ANAEROBIC Blood Culture results may not be optimal due to an inadequate volume of blood received in culture bottles   Culture   Final    NO GROWTH 1 DAY Performed at [facility], [place] [address], [place], [place] [address]    Report Status PENDING  Incomplete  SARS Coronavirus 2 [facility] order, Performed in [facility] hospital lab)     Status: None   Collection Time: 02/02/19  6:52 PM  Result Value Ref Range Status   SARS Coronavirus 2 NEGATIVE NEGATIVE Final    Comment: (NOTE) If result is NEGATIVE SARS-CoV-2 target nucleic acids are NOT DETECTED. The SARS-CoV-2 RNA is generally detectable in upper and lower  respiratory specimens during the acute phase of infection. The lowest  concentration of SARS-CoV-2 viral copies this assay can detect is 250  copies / mL. A negative result does not preclude SARS-CoV-2 infection  and should not be used as the sole basis for treatment or other  patient management decisions.  A negative result may occur with  improper specimen collection / handling, submission of specimen other  than nasopharyngeal swab, presence of viral mutation(s) within the  areas targeted by this assay, and inadequate number of viral copies  (<250 copies / mL).  A negative result must be combined with clinical  observations, patient history, and epidemiological information. If result is POSITIVE SARS-CoV-2 target nucleic acids are DETECTED. The SARS-CoV-2 RNA is generally detectable in upper and lower  respiratory specimens dur ing the acute phase of infection.  Positive  results are indicative of active infection with SARS-CoV-2.  Clinical  correlation with patient history and other diagnostic information is  necessary to determine patient infection status.  Positive results do  not rule out bacterial infection or co-infection with other viruses. If result is PRESUMPTIVE POSTIVE SARS-CoV-2 nucleic acids MAY BE PRESENT.   A presumptive positive result was obtained on the submitted specimen  and confirmed on repeat testing.  While 2019 novel coronavirus  (SARS-CoV-2) nucleic acids may be present in the submitted sample  additional confirmatory testing may be necessary for epidemiological  and / or clinical management purposes  to differentiate between  SARS-CoV-2 and other Sarbecovirus currently known to infect humans.  If clinically indicated additional testing with an alternate test  methodology [phone]) is advised. The SARS-CoV-2 RNA is generally  detectable in upper and lower respiratory sp ecimens during the acute  phase of infection. The expected result is Negative. Fact Sheet for Patients:  StrictlyIdeas.no Fact Sheet for Healthcare Providers: BankingDealers.co.za This test is not yet approved or cleared by the [place] FDA and has been authorized for detection and/or diagnosis of SARS-CoV-2 by FDA under an Emergency Use Authorization (EUA).  This EUA will remain in effect (meaning this test can be used) for the duration of the COVID-19 declaration under Section 564(b)(1) of the Act, 21 U.S.C. section 360bbb-3(b)(1), unless the authorization is terminated or revoked  sooner. Performed at [facility], [place] [address], [place], [place] [address]   MRSA PCR Screening     Status: None   Collection Time: 02/02/19  8:15 PM  Result Value Ref Range Status   MRSA by PCR NEGATIVE NEGATIVE Final    Comment:        The GeneXpert MRSA Assay (FDA approved for NASAL specimens only), is one component of a comprehensive MRSA colonization surveillance program. It is not intended to diagnose MRSA infection nor to guide or monitor treatment for MRSA infections. Performed at [facility], [place] [address] [place], [place] [address]          Radiology Studies: Ct Head Wo Contrast  Result Date: 02/02/2019 CLINICAL DATA:  58 year old female with a history of unresponsive EXAM: CT HEAD WITHOUT CONTRAST CT CERVICAL SPINE WITHOUT CONTRAST TECHNIQUE: Multidetector CT imaging of the head and cervical spine was performed following the standard protocol without intravenous contrast. Multiplanar CT image reconstructions of the cervical spine were also generated. COMPARISON:  02/16/2018 FINDINGS: CT HEAD FINDINGS Brain: No acute intracranial hemorrhage. No midline shift or mass effect. Gray-white differentiation maintained. Encephalomalacia of the left parietal and posterior temporal region. Coil mass in the basilar tip, unchanged. Right frontal approach ventriculostomy terminates just beyond the midline, unchanged. 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 Calcifications of the intracranial vasculature. Skull: No displaced fracture. Surgical changes of the right pterional craniotomy. Shunt tubing within the scalp soft tissues. Sinuses/Orbits: No acute finding. Other: None CT CERVICAL SPINE FINDINGS Alignment: Craniocervical junction aligned. Anatomic alignment of the cervical elements. No subluxation. Skull base and vertebrae: No acute fracture at the skullbase. Vertebral body heights  relatively maintained. No acute fracture identified. Soft tissues and spinal canal: Unremarkable cervical soft tissues. Lymph nodes are present, though not enlarged. Disc levels: Disc disease at C5-C6 with posterior disc osteophyte complex and bilateral uncovertebral joint disease contributing to bilateral left greater than right foraminal narrowing. Focal central disc protrusion at C4-C5, may contact the ventral thecal sac. Focal central disc protrusion at C5-C6 superimposed on disc osteophyte complex, may contact the ventral thecal sac. Upper chest: Unremarkable appearance of the lung apices. Other: Cystic nodule of  the left thyroid. Vascular calcifications of bilateral carotid region. IMPRESSION: Head CT: No acute intracranial abnormality. Redemonstration of encephalomalacia of the left posterior temporal and parietal region. Redemonstration of prior treatment of right MCA, right paraclinoid, and basilar aneurysms. Unchanged position of right frontal approach ventriculostomy. Cervical CT: No acute fracture or malalignment of the cervical spine. Degenerative disc disease worst at the C5-C6 level, with focal posterior disc protrusion superimposed on disc osteophyte complex, potentially contacting the ventral thecal sac. A second posterior disc protrusion at C4-C5 may contact the ventral thecal sac. Electronically Signed   By: Corrie Mckusick D.O.   On: 02/02/2019 16:32   Ct Cervical Spine Wo Contrast  Result Date: 02/02/2019 CLINICAL DATA:  58 year old female with a history of unresponsive EXAM: CT HEAD WITHOUT CONTRAST CT CERVICAL SPINE WITHOUT CONTRAST TECHNIQUE: Multidetector CT imaging of the head and cervical spine was performed following the standard protocol without intravenous contrast. Multiplanar CT image reconstructions of the cervical spine were also generated. COMPARISON:  02/16/2018 FINDINGS: CT HEAD FINDINGS Brain: No acute intracranial hemorrhage. No midline shift or mass effect. Gray-white  differentiation maintained. Encephalomalacia of the left parietal and posterior temporal region. Coil mass in the basilar tip, unchanged. Right frontal approach ventriculostomy terminates just beyond the midline, unchanged. 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 Calcifications of the intracranial vasculature. Skull: No displaced fracture. Surgical changes of the right pterional craniotomy. Shunt tubing within the scalp soft tissues. Sinuses/Orbits: No acute finding. Other: None CT CERVICAL SPINE FINDINGS Alignment: Craniocervical junction aligned. Anatomic alignment of the cervical elements. No subluxation. Skull base and vertebrae: No acute fracture at the skullbase. Vertebral body heights relatively maintained. No acute fracture identified. Soft tissues and spinal canal: Unremarkable cervical soft tissues. Lymph nodes are present, though not enlarged. Disc levels: Disc disease at C5-C6 with posterior disc osteophyte complex and bilateral uncovertebral joint disease contributing to bilateral left greater than right foraminal narrowing. Focal central disc protrusion at C4-C5, may contact the ventral thecal sac. Focal central disc protrusion at C5-C6 superimposed on disc osteophyte complex, may contact the ventral thecal sac. Upper chest: Unremarkable appearance of the lung apices. Other: Cystic nodule of the left thyroid. Vascular calcifications of bilateral carotid region. IMPRESSION: Head CT: No acute intracranial abnormality. Redemonstration of encephalomalacia of the left posterior temporal and parietal region. Redemonstration of prior treatment of right MCA, right paraclinoid, and basilar aneurysms. Unchanged position of right frontal approach ventriculostomy. Cervical CT: No acute fracture or malalignment of the cervical spine. Degenerative disc disease worst at the C5-C6 level, with focal posterior disc protrusion  superimposed on disc osteophyte complex, potentially contacting the ventral thecal sac. A second posterior disc protrusion at C4-C5 may contact the ventral thecal sac. Electronically Signed   By: Corrie Mckusick D.O.   On: 02/02/2019 16:32   Dg Chest Portable 1 View  Result Date: 02/02/2019 CLINICAL DATA:  Syncope.  Former smoker. EXAM: PORTABLE CHEST 1 VIEW COMPARISON:  03/20/2018 FINDINGS: Catheter extending over the right neck, chest and abdomen likely ventriculostomy catheter and unchanged. Lungs are adequately inflated without consolidation or effusion. Cardiomediastinal silhouette and remainder of the exam is unchanged. IMPRESSION: No active disease. Electronically Signed   By: Marin Olp M.D.   On: 02/02/2019 13:39        Scheduled Meds: . amiodarone  200 mg Oral Daily  . calcitRIOL  0.5 mcg Oral Q T,Th,Sa-HD  . calcium acetate  1,334 mg Oral TID  WC  . Chlorhexidine Gluconate Cloth  6 each Topical Q0600  . cinacalcet  60 mg Oral Q T,Th,Sa-HD  . heparin  5,000 Units Subcutaneous Q8H  . levETIRAcetam  500 mg Oral BID  . pantoprazole  40 mg Oral Daily  . sucroferric oxyhydroxide  1,000 mg Oral TID WC   Continuous Infusions: . sodium chloride    . sodium chloride    . sodium chloride    . ceFEPime (MAXIPIME) IV    . phenylephrine (NEO-SYNEPHRINE) Adult infusion Stopped (02/02/19 2333)     LOS: 1 day   Time spent: 40 minutes     , Geraldo Docker, MD [facility] Pager [phone]  If 7PM-7AM, please contact night-coverage [email or web address] Password [facility] 02/03/2019, 3:35 PM

## 2019-02-04 ENCOUNTER — Inpatient Hospital Stay (HOSPITAL_COMMUNITY)
Admit: 2019-02-04 | Discharge: 2019-02-04 | Disposition: A | Discharge status: Home / Self Care | Payer: 59 | Attending: Internal Medicine | Admitting: Internal Medicine

## 2019-02-04 DIAGNOSIS — G40909 Epilepsy, unspecified, not intractable, without status epilepticus: Secondary | ICD-10-CM

## 2019-02-04 DIAGNOSIS — I959 Hypotension, unspecified: Secondary | ICD-10-CM

## 2019-02-04 MED ORDER — CALCITRIOL 0.5 MCG PO CAPS: 0.5000 ug | ORAL_CAPSULE | ORAL | 0 refills | Status: DC

## 2019-02-04 MED ORDER — SUCROFERRIC OXYHYDROXIDE 500 MG PO CHEW: 1000.0000 mg | CHEWABLE_TABLET | Freq: Three times a day (TID) | ORAL | 0 refills | Status: DC

## 2019-02-04 MED ORDER — PRO-STAT SUGAR FREE PO LIQD
30.0000 mL | Freq: Two times a day (BID) | ORAL | Status: DC
  Filled 2019-02-04: qty 30

## 2019-02-04 MED ORDER — CINACALCET HCL 30 MG PO TABS: 60.0000 mg | ORAL_TABLET | ORAL | 0 refills | Status: DC

## 2019-02-04 NOTE — Procedures (Signed)
History: 58 year old female being evaluated for syncope  Sedation: None  Technique: This is a 21 channel routine scalp EEG performed at the bedside with bipolar and monopolar montages arranged in accordance to the international 10/20 system of electrode placement. One channel was dedicated to EKG recording.    Background: The background consists of intermixed alpha and beta activities. There is a well defined posterior dominant rhythm of 8-9 hz that attenuates with eye opening. Sleep is not recorded but there is anterior shifting of the posterior dominant rhythm with drowsiness.  Photic stimulation: Physiologic driving is not performed  EEG Abnormalities: None  Clinical Interpretation: This normal EEG is recorded in the waking and drowsy state. There was no seizure or seizure predisposition recorded on this study. Please note that lack of epileptiform activity on EEG does not preclude the possibility of epilepsy.   Roland Rack, MD [facility] [phone]  If 7pm- 7am, please page neurology on call as listed in [place].

## 2019-02-04 NOTE — Progress Notes (Addendum)
[facility] Progress Note   Subjective:  Seen in room - looks comfortable. No CP, dyspnea, dizziness. BP holding.   Objective Vitals:   02/03/19 2000 02/03/19 2046 02/04/19 0422 02/04/19 0936  BP: 138/65 138/69 133/69 139/62  Pulse: 72 69 69 75  Resp: 14 17 18 18   Temp:  98.2 F (36.8 C) 98.3 F (36.8 C) 98 F (36.7 C)  TempSrc:  Oral Oral Oral  SpO2: 100% 100% 100% 96%  Weight:  45.3 kg     Physical Exam General: Frail, but well appearing. NAD Heart: RRR; no murmur Lungs: CTAB Abdomen: soft, non-tender Extremities: No LE edema Dialysis Access:  AVF + thrill  Additional Objective Labs: Basic Metabolic Panel: Recent Labs  Lab 02/02/19 1230 02/03/19 0652  NA 139 143  K 3.2* 4.9  CL 98 105  CO2 24 21*  GLUCOSE 178* 85  BUN 43* 49*  CREATININE 8.48* 9.43*  CALCIUM 8.2* 8.6*  PHOS  --  7.7*   Liver Function Tests: Recent Labs  Lab 02/02/19 1230 02/03/19 0652  AST 14*  --   ALT 8  --   ALKPHOS 32*  --   BILITOT 0.4  --   PROT 5.5*  --   ALBUMIN 2.9* 2.7*   CBC: Recent Labs  Lab 02/02/19 1230 02/03/19 0652  WBC 3.5* 3.8*  HGB 11.1* 9.9*  HCT 36.3 31.0*  MCV 92.8 93.1  PLT 162 144*   Cardiac Enzymes: Recent Labs  Lab 02/02/19 1230  TROPONINI 0.06*   CBG: Recent Labs  Lab 02/02/19 1239  GLUCAP 177*   Studies/Results: Ct Head Wo Contrast  Result Date: 02/02/2019 CLINICAL DATA:  58 year old female with a history of unresponsive EXAM: CT HEAD WITHOUT CONTRAST CT CERVICAL SPINE WITHOUT CONTRAST TECHNIQUE: Multidetector CT imaging of the head and cervical spine was performed following the standard protocol without intravenous contrast. Multiplanar CT image reconstructions of the cervical spine were also generated. COMPARISON:  02/16/2018 FINDINGS: CT HEAD FINDINGS Brain: No acute intracranial hemorrhage. No midline shift or mass effect. Gray-white differentiation maintained. Encephalomalacia of the left parietal and posterior  temporal region. Coil mass in the basilar tip, unchanged. Right frontal approach ventriculostomy terminates just beyond the midline, unchanged. 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 Calcifications of the intracranial vasculature. Skull: No displaced fracture. Surgical changes of the right pterional craniotomy. Shunt tubing within the scalp soft tissues. Sinuses/Orbits: No acute finding. Other: None CT CERVICAL SPINE FINDINGS Alignment: Craniocervical junction aligned. Anatomic alignment of the cervical elements. No subluxation. Skull base and vertebrae: No acute fracture at the skullbase. Vertebral body heights relatively maintained. No acute fracture identified. Soft tissues and spinal canal: Unremarkable cervical soft tissues. Lymph nodes are present, though not enlarged. Disc levels: Disc disease at C5-C6 with posterior disc osteophyte complex and bilateral uncovertebral joint disease contributing to bilateral left greater than right foraminal narrowing. Focal central disc protrusion at C4-C5, may contact the ventral thecal sac. Focal central disc protrusion at C5-C6 superimposed on disc osteophyte complex, may contact the ventral thecal sac. Upper chest: Unremarkable appearance of the lung apices. Other: Cystic nodule of the left thyroid. Vascular calcifications of bilateral carotid region. IMPRESSION: Head CT: No acute intracranial abnormality. Redemonstration of encephalomalacia of the left posterior temporal and parietal region. Redemonstration of prior treatment of right MCA, right paraclinoid, and basilar aneurysms. Unchanged position of right frontal approach ventriculostomy. Cervical CT: No acute fracture or malalignment of the  cervical spine. Degenerative disc disease worst at the C5-C6 level, with focal posterior disc protrusion superimposed on disc osteophyte complex, potentially contacting the ventral thecal sac.  A second posterior disc protrusion at C4-C5 may contact the ventral thecal sac. Electronically Signed   By: Corrie Mckusick D.O.   On: 02/02/2019 16:32   Ct Cervical Spine Wo Contrast  Result Date: 02/02/2019 CLINICAL DATA:  58 year old female with a history of unresponsive EXAM: CT HEAD WITHOUT CONTRAST CT CERVICAL SPINE WITHOUT CONTRAST TECHNIQUE: Multidetector CT imaging of the head and cervical spine was performed following the standard protocol without intravenous contrast. Multiplanar CT image reconstructions of the cervical spine were also generated. COMPARISON:  02/16/2018 FINDINGS: CT HEAD FINDINGS Brain: No acute intracranial hemorrhage. No midline shift or mass effect. Gray-white differentiation maintained. Encephalomalacia of the left parietal and posterior temporal region. Coil mass in the basilar tip, unchanged. Right frontal approach ventriculostomy terminates just beyond the midline, unchanged. 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 Calcifications of the intracranial vasculature. Skull: No displaced fracture. Surgical changes of the right pterional craniotomy. Shunt tubing within the scalp soft tissues. Sinuses/Orbits: No acute finding. Other: None CT CERVICAL SPINE FINDINGS Alignment: Craniocervical junction aligned. Anatomic alignment of the cervical elements. No subluxation. Skull base and vertebrae: No acute fracture at the skullbase. Vertebral body heights relatively maintained. No acute fracture identified. Soft tissues and spinal canal: Unremarkable cervical soft tissues. Lymph nodes are present, though not enlarged. Disc levels: Disc disease at C5-C6 with posterior disc osteophyte complex and bilateral uncovertebral joint disease contributing to bilateral left greater than right foraminal narrowing. Focal central disc protrusion at C4-C5, may contact the ventral thecal sac. Focal central disc protrusion  at C5-C6 superimposed on disc osteophyte complex, may contact the ventral thecal sac. Upper chest: Unremarkable appearance of the lung apices. Other: Cystic nodule of the left thyroid. Vascular calcifications of bilateral carotid region. IMPRESSION: Head CT: No acute intracranial abnormality. Redemonstration of encephalomalacia of the left posterior temporal and parietal region. Redemonstration of prior treatment of right MCA, right paraclinoid, and basilar aneurysms. Unchanged position of right frontal approach ventriculostomy. Cervical CT: No acute fracture or malalignment of the cervical spine. Degenerative disc disease worst at the C5-C6 level, with focal posterior disc protrusion superimposed on disc osteophyte complex, potentially contacting the ventral thecal sac. A second posterior disc protrusion at C4-C5 may contact the ventral thecal sac. Electronically Signed   By: Corrie Mckusick D.O.   On: 02/02/2019 16:32   Dg Chest Portable 1 View  Result Date: 02/02/2019 CLINICAL DATA:  Syncope.  Former smoker. EXAM: PORTABLE CHEST 1 VIEW COMPARISON:  03/20/2018 FINDINGS: Catheter extending over the right neck, chest and abdomen likely ventriculostomy catheter and unchanged. Lungs are adequately inflated without consolidation or effusion. Cardiomediastinal silhouette and remainder of the exam is unchanged. IMPRESSION: No active disease. Electronically Signed   By: Marin Olp M.D.   On: 02/02/2019 13:39   Medications: . sodium chloride     . amiodarone  200 mg Oral Daily  . calcitRIOL  0.5 mcg Oral Q T,Th,Sa-HD  . calcium acetate  1,334 mg Oral TID WC  . Chlorhexidine Gluconate Cloth  6 each Topical Q0600  . cinacalcet  60 mg Oral Q T,Th,Sa-HD  . heparin  5,000 Units Subcutaneous Q8H  . levETIRAcetam  500 mg Oral BID  . pantoprazole  40 mg Oral Daily  . sucroferric oxyhydroxide  1,000 mg Oral TID WC  Dialysis Orders: TTS at [facility] 4 hrs 180NRe 400/800 41.5 kg 2.0 K/ 2.0 Ca R AVF -No  heparin -No ESA/Venofer -Sensipar 60 mg PO TIW (last PTH 289 01/27/19) -Calcitriol 0.5 mcg PO TIW   BMD meds: Calcium acetate 667 mg 2 capsules PO TIW and 1 capsule w/snack Velphoro 500 mg 3 tabs PO TID AC (last Ca 9.8 Phos 7.1 01/27/19)  Assessment/Plan: 1. Syncope/hypotension: Improved with volume expansion. BCx negative. CXR clear. COVID negative. 2. ESRD: Continue HD per TTS schedule - next 4/18. 3. HTN/volume: No meds and EDW will need to be raised. 4. Anemia ckd: Hgb 9.9 - trend and likely restart ESA if drops lower. 5. Secondary hyperparathyroidism: Corr Ca ok, Phos ^. Continue binders/VDRA/sensipar. 6. Nutrition: Alb low, start Pro-stat supps 7. Hx A-fib RVR 8. Hx SAH 9. Hx seizure disorder 10. Hx CAD 11. HFpEF (60-65%)  [name], PA-C 02/04/2019, 10:00 AM  [facility] Pager: [phone]  Pt seen, examined and agree w A/P as above. 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  OK for dc from renal standpoint.  [facility] 02/04/2019, 11:09 AM

## 2019-02-04 NOTE — Progress Notes (Signed)
DISCHARGE NOTE HOME [name] to be discharged Home per MD order. Discussed prescriptions and follow up appointments with the patient. Prescriptions given to patient; medication list explained in detail. Patient verbalized understanding.  Skin clean, dry and intact without evidence of skin break down, no evidence of skin tears noted. IV catheter discontinued intact. Site without signs and symptoms of complications. Dressing and pressure applied. Pt denies pain at the site currently. No complaints noted.  Patient free of lines, drains, and wounds.   An After Visit Summary (AVS) was printed and given to the patient. Patient escorted via wheelchair, and discharged home via private auto. Called daughter to go over AVS and granddaughter picked pt up.   Paulla Fore, RN

## 2019-02-04 NOTE — Discharge Summary (Addendum)
Physician Discharge Summary  [name] ULA:[id number] DOB: [birth year]  PCP: Garwin Brothers, MD  Admit date: 02/02/2019 Discharge date: 02/04/2019  Recommendations for Outpatient Follow-up:  1. Dr. Garwin Brothers, PCP 2. Hemodialysis Center: Continue regular HD appointments on Tuesdays, Thursdays and Saturdays, next on 02/05/2019.  Home Health: None. Equipment/Devices: None.  Discharge Condition: Improved and stable. CODE STATUS: Full Diet recommendation: Heart healthy diet.  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 fib with RVR,Secondary hyperparathyroidism, anemia of chronic disease, suspected medication noncompliance, presented to the ED after family heard patient fall and found patient on the floor.  Upon arrival to ED, patient was noted to be hypotensive with BP 64/43, temperature 97.4, mild leukopenia, mildly elevated lactate.  She was admitted for suspected syncope in the context of hypotension, probable sepsis which was ruled out and COVID-19 testing was negative.  PCCM initially admitted patient and then transferred to [facility] when stable.  Nephrology consulted for dialysis needs.  Assessment and plan:  1. Syncope: Likely related to hypotension from dialysis and volume depletion.  Received fluid bolus and brief Neo-Synephrine by CCM.Sepsis was suspected due to hypothermia, leukopenia and mildly elevated lactate however no clear source was found.  Was empirically started on IV cefepime and vancomycin.  Blood cultures x2, negative to date, COVID-19 testing negative, MRSA PCR negative.   Antibiotics were discontinued.  Sepsis ruled out.  EEG without seizure activity.  As per discussion with family, patient does not drive.  PT evaluated and do not see any home health needs. 2. Hypotension: Suspect secondary to hypovolemia.  Treated with gentle IV fluid resuscitation and low-dose phenylephrine.  Resolved.  Of note patient has not filled multiple prescriptions including statins, Imdur-hence removed from discharge medication list. 3. ESRD on TTS HD: Nephrology consulted and continued HD per TTS schedule.  Nephrology has cleared for discharge and next HD outpatient on 4/18. 4. A. fib: Continue home amiodarone.  Not on anticoagulation PTA.  In sinus rhythm on day of discharge. 5. Seizure disorder: Continue prior home dose of Keppra.  EEG without seizure activity. 6. Essential hypertension: Presented with hypotension.  Controlled.  Not on antihypertensives PTA. 7. Anemia in ESRD: Hemoglobin 9.9.  Nephrology plans to restart ESA if drops further.  Follow CBC at HD. 8. Secondary hyperparathyroidism: Continue medications as outlined below.  Outpatient follow-up with nephrology. 9. History of SAH/cognitive impairment: As per nephrology follow-up with outpatient center, mental status currently back to baseline.  Same was confirmed with patient's daughter today. 10. Leukopenia and thrombocytopenia: Mild and unclear etiology.  Follow CBC at next HD as outpatient.   Consultations:  Nephrology  PCCM  Procedures:  HD   Discharge Instructions  Discharge Instructions    (HEART FAILURE PATIENTS) Call MD:  Anytime you have any of the following symptoms: 1) 3 pound weight gain in 24 hours or 5 pounds in 1 week 2) shortness of breath, with or without a dry hacking cough 3) swelling in the hands, feet or stomach 4) if you have to sleep on extra pillows at night in order to breathe.   Complete by:  As directed  Call MD for:   Complete by:  As directed    Passing out or seizure-like activity.    Call MD for:  difficulty breathing, headache or visual disturbances   Complete by:  As directed    Call MD for:  extreme fatigue   Complete by:  As directed    Call MD for:  persistant dizziness or light-headedness   Complete by:  As directed    Call MD for:  persistant nausea and vomiting   Complete by:  As directed    Call MD for:  severe uncontrolled pain   Complete by:  As directed    Call MD for:  temperature >100.4   Complete by:  As directed    Diet - low sodium heart healthy   Complete by:  As directed    Increase activity slowly   Complete by:  As directed        Medication List    STOP taking these medications   atorvastatin 40 MG tablet Commonly known as:  LIPITOR   isosorbide mononitrate 60 MG 24 hr tablet Commonly known as:  IMDUR   lidocaine-prilocaine cream Commonly known as:  EMLA   tamsulosin 0.4 MG Caps capsule Commonly known as:  FLOMAX     TAKE these medications   amiodarone 200 MG tablet Commonly known as:  PACERONE Take 2 tabs (400mg ) daily for 7 days, then 1 tab (200mg ) daily thereafter   calcitRIOL 0.5 MCG capsule Commonly known as:  ROCALTROL Take 1 capsule (0.5 mcg total) by mouth Every Tuesday,Thursday,and Saturday with dialysis. Start taking on:  February 05, 2019   cinacalcet 30 MG tablet Commonly known as:  SENSIPAR Take 2 tablets (60 mg total) by mouth Every Tuesday,Thursday,and Saturday with dialysis. Start taking on:  February 05, 2019   lactulose 10 GM/15ML solution Commonly known as:  CHRONULAC Take 10 g by mouth daily as needed for moderate constipation.   levETIRAcetam 500 MG tablet Commonly known as:  KEPPRA Take 500 mg by mouth 2 (two) times daily.   multivitamin Tabs tablet Take 1 tablet by mouth daily.   nitroGLYCERIN 0.4 MG SL tablet Commonly known as:  NITROSTAT Place 1 tablet (0.4 mg total) under the tongue every 5 (five) minutes as needed for chest pain.   omeprazole 20 MG capsule Commonly known as:   PRILOSEC Take 20 mg by mouth daily.   sucroferric oxyhydroxide 500 MG chewable tablet Commonly known as:  Velphoro Chew 2 tablets (1,000 mg total) by mouth 3 (three) times daily with meals. What changed:  how much to take      Follow-up Information    Garwin Brothers, MD. Schedule an appointment as soon as possible for a visit.   Specialty:  Internal Medicine Contact information: [place] [address] [phone]        Troy Sine, MD .   Specialty:  Cardiology Contact information: [address] [place] [address] [place] Follow up on 02/05/2019.   Why:  Continue regular hemodialysis appointments on Tuesdays, Thursdays and Saturdays.         Allergies  Allergen Reactions  . Ampicillin Hives and Rash    Has patient had a PCN reaction causing immediate rash, facial/tongue/throat swelling, SOB or lightheadedness with hypotension: No Has patient had a PCN reaction causing severe rash involving mucus membranes or skin necrosis: No Has patient had a PCN reaction that required  hospitalization: No Has patient had a PCN reaction occurring within the last 10 years: No If all of the above answers are "NO", then may proceed with Cephalosporin use.   Marland Kitchen Penicillins Hives and Rash    Has patient had a PCN reaction causing immediate rash, facial/tongue/throat swelling, SOB or lightheadedness with hypotension: Yes Has patient had a PCN reaction causing severe rash involving mucus membranes or skin necrosis: No Has patient had a PCN reaction that required hospitalization: No Has patient had a PCN reaction occurring within the last 10 years: No If all of the above answers are "NO", then may proceed with Cephalosporin use.       Procedures/Studies: Ct Head Wo Contrast  Result Date: 02/02/2019 CLINICAL DATA:  57 year old female with a history of unresponsive EXAM: CT HEAD WITHOUT CONTRAST CT CERVICAL SPINE  WITHOUT CONTRAST TECHNIQUE: Multidetector CT imaging of the head and cervical spine was performed following the standard protocol without intravenous contrast. Multiplanar CT image reconstructions of the cervical spine were also generated. COMPARISON:  02/16/2018 FINDINGS: CT HEAD FINDINGS Brain: No acute intracranial hemorrhage. No midline shift or mass effect. Gray-white differentiation maintained. Encephalomalacia of the left parietal and posterior temporal region. Coil mass in the basilar tip, unchanged. Right frontal approach ventriculostomy terminates just beyond the midline, unchanged. 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 Calcifications of the intracranial vasculature. Skull: No displaced fracture. Surgical changes of the right pterional craniotomy. Shunt tubing within the scalp soft tissues. Sinuses/Orbits: No acute finding. Other: None CT CERVICAL SPINE FINDINGS Alignment: Craniocervical junction aligned. Anatomic alignment of the cervical elements. No subluxation. Skull base and vertebrae: No acute fracture at the skullbase. Vertebral body heights relatively maintained. No acute fracture identified. Soft tissues and spinal canal: Unremarkable cervical soft tissues. Lymph nodes are present, though not enlarged. Disc levels: Disc disease at C5-C6 with posterior disc osteophyte complex and bilateral uncovertebral joint disease contributing to bilateral left greater than right foraminal narrowing. Focal central disc protrusion at C4-C5, may contact the ventral thecal sac. Focal central disc protrusion at C5-C6 superimposed on disc osteophyte complex, may contact the ventral thecal sac. Upper chest: Unremarkable appearance of the lung apices. Other: Cystic nodule of the left thyroid. Vascular calcifications of bilateral carotid region. IMPRESSION: Head CT: No acute intracranial abnormality. Redemonstration of  encephalomalacia of the left posterior temporal and parietal region. Redemonstration of prior treatment of right MCA, right paraclinoid, and basilar aneurysms. Unchanged position of right frontal approach ventriculostomy. Cervical CT: No acute fracture or malalignment of the cervical spine. Degenerative disc disease worst at the C5-C6 level, with focal posterior disc protrusion superimposed on disc osteophyte complex, potentially contacting the ventral thecal sac. A second posterior disc protrusion at C4-C5 may contact the ventral thecal sac. Electronically Signed   By: Corrie Mckusick D.O.   On: 02/02/2019 16:32   Ct Cervical Spine Wo Contrast  Result Date: 02/02/2019 CLINICAL DATA:  58 year old female with a history of unresponsive EXAM: CT HEAD WITHOUT CONTRAST CT CERVICAL SPINE WITHOUT CONTRAST TECHNIQUE: Multidetector CT imaging of the head and cervical spine was performed following the standard protocol without intravenous contrast. Multiplanar CT image reconstructions of the cervical spine were also generated. COMPARISON:  02/16/2018 FINDINGS: CT HEAD FINDINGS Brain: No acute intracranial hemorrhage. No midline shift or mass effect. Gray-white differentiation maintained. Encephalomalacia of the left parietal and posterior temporal region. Coil mass in the basilar tip, unchanged. Right frontal approach ventriculostomy terminates just beyond  the midline, unchanged. 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 Calcifications of the intracranial vasculature. Skull: No displaced fracture. Surgical changes of the right pterional craniotomy. Shunt tubing within the scalp soft tissues. Sinuses/Orbits: No acute finding. Other: None CT CERVICAL SPINE FINDINGS Alignment: Craniocervical junction aligned. Anatomic alignment of the cervical elements. No subluxation. Skull base and vertebrae: No acute fracture at the skullbase. Vertebral  body heights relatively maintained. No acute fracture identified. Soft tissues and spinal canal: Unremarkable cervical soft tissues. Lymph nodes are present, though not enlarged. Disc levels: Disc disease at C5-C6 with posterior disc osteophyte complex and bilateral uncovertebral joint disease contributing to bilateral left greater than right foraminal narrowing. Focal central disc protrusion at C4-C5, may contact the ventral thecal sac. Focal central disc protrusion at C5-C6 superimposed on disc osteophyte complex, may contact the ventral thecal sac. Upper chest: Unremarkable appearance of the lung apices. Other: Cystic nodule of the left thyroid. Vascular calcifications of bilateral carotid region. IMPRESSION: Head CT: No acute intracranial abnormality. Redemonstration of encephalomalacia of the left posterior temporal and parietal region. Redemonstration of prior treatment of right MCA, right paraclinoid, and basilar aneurysms. Unchanged position of right frontal approach ventriculostomy. Cervical CT: No acute fracture or malalignment of the cervical spine. Degenerative disc disease worst at the C5-C6 level, with focal posterior disc protrusion superimposed on disc osteophyte complex, potentially contacting the ventral thecal sac. A second posterior disc protrusion at C4-C5 may contact the ventral thecal sac. Electronically Signed   By: Corrie Mckusick D.O.   On: 02/02/2019 16:32   Dg Chest Portable 1 View  Result Date: 02/02/2019 CLINICAL DATA:  Syncope.  Former smoker. EXAM: PORTABLE CHEST 1 VIEW COMPARISON:  03/20/2018 FINDINGS: Catheter extending over the right neck, chest and abdomen likely ventriculostomy catheter and unchanged. Lungs are adequately inflated without consolidation or effusion. Cardiomediastinal silhouette and remainder of the exam is unchanged. IMPRESSION: No active disease. Electronically Signed   By: Marin Olp M.D.   On: 02/02/2019 13:39      Subjective: Patient alert and  oriented x2.  Denies complaints.  Denies dizziness, lightheadedness, chest pain, dyspnea or palpitations.  As per RN, no acute issues noted.  Discharge Exam:  Vitals:   02/03/19 2000 02/03/19 2046 02/04/19 0422 02/04/19 0936  BP: 138/65 138/69 133/69 139/62  Pulse: 72 69 69 75  Resp: 14 17 18 18   Temp:  98.2 F (36.8 C) 98.3 F (36.8 C) 98 F (36.7 C)  TempSrc:  Oral Oral Oral  SpO2: 100% 100% 100% 96%  Weight:  45.3 kg      General: Pleasant middle-aged female, looks older than stated age, moderately built and thinly nourished sitting up comfortably in bed without distress. Cardiovascular: S1 & S2 heard, RRR, S1/S2 +. No murmurs, rubs, gallops or clicks. No JVD or pedal edema.  Telemetry personally reviewed: Sinus rhythm. Respiratory: Clear to auscultation without wheezing, rhonchi or crackles. No increased work of breathing. Abdominal:  Non distended, non tender & soft. No organomegaly or masses appreciated. Normal bowel sounds heard. CNS: Alert and oriented x2. No focal deficits. Extremities: no edema, no cyanosis.  Right upper arm dialysis fistula with thrill.    The results of significant diagnostics from this hospitalization (including imaging, microbiology, ancillary and laboratory) are listed below for reference.     Microbiology: Recent Results (from the past 240 hour(s))  Blood Culture (routine x 2)     Status: None (Preliminary result)  Collection Time: 02/02/19  1:05 PM  Result Value Ref Range Status   Specimen Description BLOOD LEFT WRIST  Final   Special Requests   Final    BOTTLES DRAWN AEROBIC AND ANAEROBIC Blood Culture results may not be optimal due to an inadequate volume of blood received in culture bottles   Culture   Final    NO GROWTH 2 DAYS Performed at [facility], [place] [address] [place], [place] [address]    Report Status PENDING  Incomplete  Blood Culture (routine x 2)     Status: None (Preliminary result)   Collection Time: 02/02/19   1:47 PM  Result Value Ref Range Status   Specimen Description BLOOD LEFT HAND  Final   Special Requests   Final    BOTTLES DRAWN AEROBIC AND ANAEROBIC Blood Culture results may not be optimal due to an inadequate volume of blood received in culture bottles   Culture   Final    NO GROWTH 2 DAYS Performed at [facility], [place] [address] [place], [place] [address]    Report Status PENDING  Incomplete  SARS Coronavirus 2 [facility] order, Performed in [place] hospital lab)     Status: None   Collection Time: 02/02/19  6:52 PM  Result Value Ref Range Status   SARS Coronavirus 2 NEGATIVE NEGATIVE Final    Comment: (NOTE) If result is NEGATIVE SARS-CoV-2 target nucleic acids are NOT DETECTED. The SARS-CoV-2 RNA is generally detectable in upper and lower  respiratory specimens during the acute phase of infection. The lowest  concentration of SARS-CoV-2 viral copies this assay can detect is 250  copies / mL. A negative result does not preclude SARS-CoV-2 infection  and should not be used as the sole basis for treatment or other  patient management decisions.  A negative result may occur with  improper specimen collection / handling, submission of specimen other  than nasopharyngeal swab, presence of viral mutation(s) within the  areas targeted by this assay, and inadequate number of viral copies  (<250 copies / mL). A negative result must be combined with clinical  observations, patient history, and epidemiological information. If result is POSITIVE SARS-CoV-2 target nucleic acids are DETECTED. The SARS-CoV-2 RNA is generally detectable in upper and lower  respiratory specimens dur ing the acute phase of infection.  Positive  results are indicative of active infection with SARS-CoV-2.  Clinical  correlation with patient history and other diagnostic information is  necessary to determine patient infection status.  Positive results do  not rule out bacterial infection or  co-infection with other viruses. If result is PRESUMPTIVE POSTIVE SARS-CoV-2 nucleic acids MAY BE PRESENT.   A presumptive positive result was obtained on the submitted specimen  and confirmed on repeat testing.  While 2019 novel coronavirus  (SARS-CoV-2) nucleic acids may be present in the submitted sample  additional confirmatory testing may be necessary for epidemiological  and / or clinical management purposes  to differentiate between  SARS-CoV-2 and other Sarbecovirus currently known to infect humans.  If clinically indicated additional testing with an alternate test  methodology [phone]) is advised. The SARS-CoV-2 RNA is generally  detectable in upper and lower respiratory sp ecimens during the acute  phase of infection. The expected result is Negative. Fact Sheet for Patients:  StrictlyIdeas.no Fact Sheet for Healthcare Providers: BankingDealers.co.za This test is not yet approved or cleared by the [place] FDA and has been authorized for detection and/or diagnosis of SARS-CoV-2 by FDA  under an Emergency Use Authorization (EUA).  This EUA will remain in effect (meaning this test can be used) for the duration of the COVID-19 declaration under Section 564(b)(1) of the Act, 21 U.S.C. section 360bbb-3(b)(1), unless the authorization is terminated or revoked sooner. Performed at [facility], [place] [address], [place], [place] [address]   MRSA PCR Screening     Status: None   Collection Time: 02/02/19  8:15 PM  Result Value Ref Range Status   MRSA by PCR NEGATIVE NEGATIVE Final    Comment:        The GeneXpert MRSA Assay (FDA approved for NASAL specimens only), is one component of a comprehensive MRSA colonization surveillance program. It is not intended to diagnose MRSA infection nor to guide or monitor treatment for MRSA infections. Performed at [facility], [place] [address], [place],  [address]       Labs: CBC: Recent Labs  Lab 02/02/19 1230 02/03/19 0652  WBC 3.5* 3.8*  HGB 11.1* 9.9*  HCT 36.3 31.0*  MCV 92.8 93.1  PLT 162 782*   Basic Metabolic Panel: Recent Labs  Lab 02/02/19 1230 02/03/19 0652  NA 139 143  K 3.2* 4.9  CL 98 105  CO2 24 21*  GLUCOSE 178* 85  BUN 43* 49*  CREATININE 8.48* 9.43*  CALCIUM 8.2* 8.6*  MG  --  1.9  PHOS  --  7.7*   Liver Function Tests: Recent Labs  Lab 02/02/19 1230 02/03/19 0652  AST 14*  --   ALT 8  --   ALKPHOS 32*  --   BILITOT 0.4  --   PROT 5.5*  --   ALBUMIN 2.9* 2.7*   Cardiac Enzymes: Recent Labs  Lab 02/02/19 1230  TROPONINI 0.06*   CBG: Recent Labs  Lab 02/02/19 1239  GLUCAP 177*   I discussed in detail with patient's daughter, updated care and answered questions.  Time coordinating discharge: 40 minutes  SIGNED:  Vernell Leep, MD, FACP, [facility]  To contact the attending provider between 7A-7P or the covering provider during after hours 7P-7A, please log into the web site [email or web address] and access using universal [place] password for that web site. If you do not have the password, please call the hospital operator.

## 2019-02-04 NOTE — Progress Notes (Signed)
Report received from [facility] RN. Patient arrived to unit via low bed. A/O to self. Placed on telemetry. VSS. Will continue to monitor.

## 2019-02-04 NOTE — Evaluation (Signed)
Physical Therapy Evaluation Patient Details Name: [name] MRN: [id number] DOB: 31-Aug-1961 Today's Date: 02/04/2019   History of Present Illness  58 year old female with PMH significant for ESRD on HD with dialysis days Tuesday, Thursday, Saturday, subarachnoid hemorrhage, hypertension, congestive heart failure, and seizures. had syncopal episode causing ED presentation. Found to be hypotensive refractory to IVF resuscitation.   Clinical Impression  Patient presents likely close to functional baseline ambulating with minguard A and able to get OOB put on socks at EOB and stand unaided.  Feel she improved with gait during session as initially more shuffling and mildly antalgic, then more confident with arm swing and more fluid pattern.  Feel she has adequate assistance at home with daughter providing 24 hour assist.  No PT follow up at this time.  PT to sign off.    Follow Up Recommendations No PT follow up    Equipment Recommendations  None recommended by PT    Recommendations for Other Services       Precautions / Restrictions Precautions Precautions: Fall      Mobility  Bed Mobility Overal bed mobility: Modified Independent                Transfers Overall transfer level: Needs assistance   Transfers: Sit to/from Stand Sit to Stand: Supervision         General transfer comment: stood easily, but cues for waiting for assist for safety  Ambulation/Gait Ambulation/Gait assistance: Min guard Gait Distance (Feet): 300 Feet Assistive device: None Gait Pattern/deviations: Step-through pattern;Step-to pattern;Decreased stride length;Wide base of support     General Gait Details: mild imbalance, but no LOB, assist for safety and pt eager to walk further than first hallwayl  Stairs            Wheelchair Mobility    Modified Rankin (Stroke Patients Only)       Balance Overall balance assessment: Needs assistance   Sitting balance-Leahy Scale: Good      Standing balance support: No upper extremity supported Standing balance-Leahy Scale: Fair                               Pertinent Vitals/Pain Pain Assessment: No/denies pain    Home Living Family/patient expects to be discharged to:: Private residence Living Arrangements: Children(daughter) Available Help at Discharge: Family;Available 24 hours/day Type of Home: House Home Access: Level entry     Home Layout: One level Home Equipment: None      Prior Function Level of Independence: Independent         Comments: doesn't cook/clean, daughter home 24/7     Hand Dominance        Extremity/Trunk Assessment   Upper Extremity Assessment Upper Extremity Assessment: Overall WFL for tasks assessed    Lower Extremity Assessment Lower Extremity Assessment: Overall WFL for tasks assessed       Communication   Communication: No difficulties  Cognition Arousal/Alertness: Awake/alert Behavior During Therapy: WFL for tasks assessed/performed Overall Cognitive Status: History of cognitive impairments - at baseline                                 General Comments: probably close to baseline, but no family present; able to give sketchy history, but admits to not remembering where dialysis center is and what she is here for      General Comments  Exercises     Assessment/Plan    PT Assessment Patent does not need any further PT services  PT Problem List         PT Treatment Interventions      PT Goals (Current goals can be found in the Care Plan section)  Acute Rehab PT Goals PT Goal Formulation: All assessment and education complete, DC therapy    Frequency     Barriers to discharge        Co-evaluation               AM-PAC PT "6 Clicks" Mobility  Outcome Measure Help needed turning from your back to your side while in a flat bed without using bedrails?: None Help needed moving from lying on your back to sitting  on the side of a flat bed without using bedrails?: None Help needed moving to and from a bed to a chair (including a wheelchair)?: A Little Help needed standing up from a chair using your arms (e.g., wheelchair or bedside chair)?: A Little Help needed to walk in hospital room?: A Little Help needed climbing 3-5 steps with a railing? : A Little 6 Click Score: 20    End of Session Equipment Utilized During Treatment: Gait belt Activity Tolerance: Patient tolerated treatment well Patient left: with chair alarm set;in chair;with call bell/phone within reach   PT Visit Diagnosis: Other symptoms and signs involving the nervous system (R29.898);Muscle weakness (generalized) (M62.81)    Time: 3500-9381 PT Time Calculation (min) (ACUTE ONLY): 27 min   Charges:   PT Evaluation $PT Eval Low Complexity: 1 Low PT Treatments $Gait Training: 8-22 mins        Magda Kiel, PT Acute Rehabilitation Services [phone] 02/04/2019   Reginia Naas 02/04/2019, 3:12 PM

## 2019-02-04 NOTE — Progress Notes (Signed)
EEG completed, results pending. 

## 2019-02-04 NOTE — Discharge Instructions (Signed)

## 2019-02-07 LAB — CULTURE, BLOOD (ROUTINE X 2)
Culture: NO GROWTH
Culture: NO GROWTH

## 2019-05-16 ENCOUNTER — Other Ambulatory Visit: Payer: Self-pay | Admitting: Internal Medicine

## 2019-05-16 DIAGNOSIS — Z1231 Encounter for screening mammogram for malignant neoplasm of breast: Secondary | ICD-10-CM

## 2019-07-07 DIAGNOSIS — E876 Hypokalemia: Secondary | ICD-10-CM | POA: Insufficient documentation

## 2019-08-08 DIAGNOSIS — T7840XA Allergy, unspecified, initial encounter: Secondary | ICD-10-CM | POA: Insufficient documentation

## 2019-11-28 DIAGNOSIS — R519 Headache, unspecified: Secondary | ICD-10-CM | POA: Insufficient documentation

## 2019-12-06 ENCOUNTER — Other Ambulatory Visit: Payer: Self-pay

## 2019-12-06 ENCOUNTER — Encounter: Payer: Self-pay | Admitting: Vascular Surgery

## 2019-12-06 ENCOUNTER — Ambulatory Visit (INDEPENDENT_AMBULATORY_CARE_PROVIDER_SITE_OTHER): Payer: 59 | Admitting: Vascular Surgery

## 2019-12-06 VITALS — BP 159/84 | HR 88 | Temp 98.1°F | Resp 20 | Ht 61.0 in | Wt 103.3 lb

## 2019-12-06 DIAGNOSIS — N186 End stage renal disease: Secondary | ICD-10-CM

## 2019-12-06 NOTE — Progress Notes (Signed)
[facility]  Patient name: [name] MRN: [id number] DOB: [birth year] Sex: female  REASON FOR CONSULT: Evaluation of right upper arm AV fistula  HPI: [name] is a 59 y.o. female, who is here to day for evaluation of right upper arm AV fistula.  I am also discussing her findings by telephone with her daughter due to Covid visitation restrictions.  Ms. Puck is able to answer some questions but has had subarachnoid hemorrhage in the past and is not totally neurologically intact.  She has a right upper arm basilic vein transposition fistula which was done by myself in July 2014.  Her daughter reports that she went several years without needing it but has been on dialysis for 2 to 3 years now.  She reports no history of bleeding from the fistula.  To her knowledge there is never been a episode of clotting or need for intervention as an outpatient setting.  The patient denies any pain associated with her fistula.  She reports no history of skin breakdown over the fistula.  Past Medical History:  Diagnosis Date  . Anterior cerebral artery aneurysm    1996 AT [facility]  . Coronary atherosclerosis of native coronary artery    STEMI Feb 2009 - 70% distal LAD lesion c/w plaque rupture, DES distal RCA  . Diastolic heart failure    LVEF 60-65% with grade 2 diastolic dysfunction - July 2014  . End-stage renal disease on hemodialysis ([place]) 02/03/2019  . ESRD (end stage renal disease) on dialysis ([place])   . HTN (hypertension)    Resistant  . Hypercholesterolemia   . Subarachnoid hemorrhage ([place])    03/2012  . Tobacco abuse    Resumed smoking half a pack a day. She was a smoker in the past and states she was abl eto stop in the past using nicotine patches  . Unspecified atrial fibrillation ([place]) 02/03/2019    Family History  Problem Relation Age of Onset  . Heart disease Mother   . Heart disease Father   . Coronary artery disease Other         Premature in 1st degree relatives    SOCIAL HISTORY: Social History   Socioeconomic History  . Marital status: Single    Spouse name: Not on file  . Number of children: Not on file  . Years of education: Not on file  . Highest education level: Not on file  Occupational History  . Not on file  Tobacco Use  . Smoking status: Former Smoker    Packs/day: 0.20    Years: 35.00    Pack years: 7.00    Types: Cigarettes    Quit date: 03/20/2017    Years since quitting: 2.7  . Smokeless tobacco: Never Used  . Tobacco comment: 1/2 ppd   Substance and Sexual Activity  . Alcohol use: No  . Drug use: No  . Sexual activity: Never  Other Topics Concern  . Not on file  Social History Narrative   Lives by herself but helps care for her 8 grandchildren.    Social Determinants of Health   Financial Resource Strain:   . Difficulty of Paying Living Expenses: Not on file  Food Insecurity:   . Worried About Charity fundraiser in the Last Year: Not on file  . Ran Out of Food in the Last Year: Not on file  Transportation Needs:   . Lack of Transportation (Medical): Not on file  . Lack of  Transportation (Non-Medical): Not on file  Physical Activity:   . Days of Exercise per Week: Not on file  . Minutes of Exercise per Session: Not on file  Stress:   . Feeling of Stress : Not on file  Social Connections:   . Frequency of Communication with Friends and Family: Not on file  . Frequency of Social Gatherings with Friends and Family: Not on file  . Attends Religious Services: Not on file  . Active Member of Clubs or Organizations: Not on file  . Attends Archivist Meetings: Not on file  . Marital Status: Not on file  Intimate Partner Violence:   . Fear of Current or Ex-Partner: Not on file  . Emotionally Abused: Not on file  . Physically Abused: Not on file  . Sexually Abused: Not on file    Allergies  Allergen Reactions  . Ampicillin Hives and Rash    Has patient had  a PCN reaction causing immediate rash, facial/tongue/throat swelling, SOB or lightheadedness with hypotension: No Has patient had a PCN reaction causing severe rash involving mucus membranes or skin necrosis: No Has patient had a PCN reaction that required hospitalization: No Has patient had a PCN reaction occurring within the last 10 years: No If all of the above answers are "NO", then may proceed with Cephalosporin use.   Marland Kitchen Penicillins Hives and Rash    Has patient had a PCN reaction causing immediate rash, facial/tongue/throat swelling, SOB or lightheadedness with hypotension: Yes Has patient had a PCN reaction causing severe rash involving mucus membranes or skin necrosis: No Has patient had a PCN reaction that required hospitalization: No Has patient had a PCN reaction occurring within the last 10 years: No If all of the above answers are "NO", then may proceed with Cephalosporin use.     Current Outpatient Medications  Medication Sig Dispense Refill  . amiodarone (PACERONE) 200 MG tablet Take 2 tabs (400mg ) daily for 7 days, then 1 tab (200mg ) daily thereafter 60 tablet 1  . aspirin 81 MG EC tablet Take by mouth.    . calcitRIOL (ROCALTROL) 0.5 MCG capsule Take 1 capsule (0.5 mcg total) by mouth Every Tuesday,Thursday,and Saturday with dialysis. 15 capsule 0  . calcium acetate (PHOSLO) 667 MG capsule Take 667 mg by mouth 3 (three) times daily.    . cinacalcet (SENSIPAR) 30 MG tablet Take 2 tablets (60 mg total) by mouth Every Tuesday,Thursday,and Saturday with dialysis. 30 tablet 0  . iron sucrose in sodium chloride 0.9 % 100 mL Iron Sucrose (Venofer)    . lactulose (CHRONULAC) 10 GM/15ML solution Take 10 g by mouth daily as needed for moderate constipation.     . levETIRAcetam (KEPPRA) 500 MG tablet Take 500 mg by mouth 2 (two) times daily.    . multivitamin (RENA-VIT) TABS tablet Take 1 tablet by mouth daily.    . nitroGLYCERIN (NITROSTAT) 0.4 MG SL tablet Place 1 tablet (0.4 mg  total) under the tongue every 5 (five) minutes as needed for chest pain. 20 tablet 0  . omeprazole (PRILOSEC) 20 MG capsule Take 20 mg by mouth daily.    . sucroferric oxyhydroxide (VELPHORO) 500 MG chewable tablet Chew 2 tablets (1,000 mg total) by mouth 3 (three) times daily with meals. 180 tablet 0   No current facility-administered medications for this visit.    REVIEW OF SYSTEMS:  [X]  denotes positive finding, [ ]  denotes negative finding Cardiac  Comments:  Chest pain or chest pressure:    Shortness  of breath upon exertion:    Short of breath when lying flat:    Irregular heart rhythm:        Vascular    Pain in calf, thigh, or hip brought on by ambulation:    Pain in feet at night that wakes you up from your sleep:     Blood clot in your veins:    Leg swelling:         Pulmonary    Oxygen at home:    Productive cough:     Wheezing:         Neurologic    Sudden weakness in arms or legs:     Sudden numbness in arms or legs:     Sudden onset of difficulty speaking or slurred speech:    Temporary loss of vision in one eye:     Problems with dizziness:         Gastrointestinal    Blood in stool:     Vomited blood:         Genitourinary    Burning when urinating:     Blood in urine:        Psychiatric    Major depression:         Hematologic    Bleeding problems:    Problems with blood clotting too easily:        Skin    Rashes or ulcers:        Constitutional    Fever or chills:      PHYSICAL EXAM: Vitals:   12/06/19 1037  BP: (!) 159/84  Pulse: 88  Resp: 20  Temp: 98.1 F (36.7 C)  SpO2: 99%  Weight: 103 lb 4.8 oz (46.9 kg)  Height: 5\' 1"  (1.549 m)    GENERAL: The patient is a well-nourished female, in no acute distress. The vital signs are documented above. CARDIOVASCULAR: Right upper arm fistula very well developed with an excellent thrill.  It is markedly dilated throughout its course.  The area of access does have some moderate enlargement  throughout.  There is no skin breakdown in the skin is not tethered to the subcutaneous vein PULMONARY: There is good air exchange  ABDOMEN: Soft and non-tender  MUSCULOSKELETAL: There are no major deformities or cyanosis. NEUROLOGIC: No focal weakness or paresthesias are detected. SKIN: There are no ulcers or rashes noted. PSYCHIATRIC: The patient has a normal affect.  DATA:  None  MEDICAL ISSUES: I discussed these findings with the patient and her daughter.  She does have a very nicely developed AV fistula and is never had any difficulty with this.  She has developed a Mega fistula with the vein being quite dilated throughout its course and there are 2 areas that are enlarged.  Since she is having no pain with dialysis and is not having any difficulty with her dialysis run and no evidence of skin breakdown, I would not recommend any revision of her fistula.  They will continue to monitor the skin and we would recommend intervention should she develop any skin breakdown.  She was reassured with this discussion will see [place] again on an as-needed basis   Rosetta Posner, MD [facility] Tel [phone] Pager [phone]

## 2020-07-18 ENCOUNTER — Ambulatory Visit: Payer: 59

## 2020-07-27 ENCOUNTER — Ambulatory Visit: Payer: Self-pay | Admitting: Podiatry

## 2020-10-06 ENCOUNTER — Inpatient Hospital Stay (HOSPITAL_COMMUNITY)
Admission: EM | Admit: 2020-10-06 | Discharge: 2020-10-17 | DRG: 252 | Disposition: A | Discharge status: Home / Self Care | Payer: 59 | Source: Ambulatory Visit | Attending: Internal Medicine | Admitting: Internal Medicine

## 2020-10-06 ENCOUNTER — Emergency Department (HOSPITAL_COMMUNITY): Payer: 59

## 2020-10-06 ENCOUNTER — Encounter (HOSPITAL_COMMUNITY): Payer: Self-pay | Admitting: Emergency Medicine

## 2020-10-06 ENCOUNTER — Observation Stay (HOSPITAL_COMMUNITY): Payer: 59

## 2020-10-06 DIAGNOSIS — E1151 Type 2 diabetes mellitus with diabetic peripheral angiopathy without gangrene: Secondary | ICD-10-CM | POA: Diagnosis present

## 2020-10-06 DIAGNOSIS — G459 Transient cerebral ischemic attack, unspecified: Secondary | ICD-10-CM

## 2020-10-06 DIAGNOSIS — R636 Underweight: Secondary | ICD-10-CM | POA: Diagnosis present

## 2020-10-06 DIAGNOSIS — Z955 Presence of coronary angioplasty implant and graft: Secondary | ICD-10-CM

## 2020-10-06 DIAGNOSIS — M549 Dorsalgia, unspecified: Secondary | ICD-10-CM | POA: Diagnosis present

## 2020-10-06 DIAGNOSIS — Z87891 Personal history of nicotine dependence: Secondary | ICD-10-CM

## 2020-10-06 DIAGNOSIS — Z880 Allergy status to penicillin: Secondary | ICD-10-CM

## 2020-10-06 DIAGNOSIS — I96 Gangrene, not elsewhere classified: Secondary | ICD-10-CM | POA: Diagnosis present

## 2020-10-06 DIAGNOSIS — Y712 Prosthetic and other implants, materials and accessory cardiovascular devices associated with adverse incidents: Secondary | ICD-10-CM | POA: Diagnosis present

## 2020-10-06 DIAGNOSIS — T82510A Breakdown (mechanical) of surgically created arteriovenous fistula, initial encounter: Secondary | ICD-10-CM | POA: Diagnosis present

## 2020-10-06 DIAGNOSIS — Z982 Presence of cerebrospinal fluid drainage device: Secondary | ICD-10-CM | POA: Diagnosis not present

## 2020-10-06 DIAGNOSIS — I21A1 Myocardial infarction type 2: Secondary | ICD-10-CM | POA: Diagnosis not present

## 2020-10-06 DIAGNOSIS — I10 Essential (primary) hypertension: Secondary | ICD-10-CM | POA: Diagnosis not present

## 2020-10-06 DIAGNOSIS — Z992 Dependence on renal dialysis: Secondary | ICD-10-CM

## 2020-10-06 DIAGNOSIS — N2581 Secondary hyperparathyroidism of renal origin: Secondary | ICD-10-CM | POA: Diagnosis present

## 2020-10-06 DIAGNOSIS — I480 Paroxysmal atrial fibrillation: Secondary | ICD-10-CM | POA: Diagnosis present

## 2020-10-06 DIAGNOSIS — E041 Nontoxic single thyroid nodule: Secondary | ICD-10-CM | POA: Diagnosis present

## 2020-10-06 DIAGNOSIS — I69098 Other sequelae following nontraumatic subarachnoid hemorrhage: Secondary | ICD-10-CM

## 2020-10-06 DIAGNOSIS — T82898A Other specified complication of vascular prosthetic devices, implants and grafts, initial encounter: Principal | ICD-10-CM | POA: Diagnosis present

## 2020-10-06 DIAGNOSIS — I775 Necrosis of artery: Secondary | ICD-10-CM | POA: Diagnosis present

## 2020-10-06 DIAGNOSIS — Z95828 Presence of other vascular implants and grafts: Secondary | ICD-10-CM

## 2020-10-06 DIAGNOSIS — E782 Mixed hyperlipidemia: Secondary | ICD-10-CM | POA: Diagnosis present

## 2020-10-06 DIAGNOSIS — R4182 Altered mental status, unspecified: Secondary | ICD-10-CM

## 2020-10-06 DIAGNOSIS — I2510 Atherosclerotic heart disease of native coronary artery without angina pectoris: Secondary | ICD-10-CM | POA: Diagnosis present

## 2020-10-06 DIAGNOSIS — F0390 Unspecified dementia without behavioral disturbance: Secondary | ICD-10-CM | POA: Diagnosis present

## 2020-10-06 DIAGNOSIS — G40909 Epilepsy, unspecified, not intractable, without status epilepticus: Secondary | ICD-10-CM | POA: Diagnosis present

## 2020-10-06 DIAGNOSIS — Z7401 Bed confinement status: Secondary | ICD-10-CM

## 2020-10-06 DIAGNOSIS — Z8249 Family history of ischemic heart disease and other diseases of the circulatory system: Secondary | ICD-10-CM

## 2020-10-06 DIAGNOSIS — R778 Other specified abnormalities of plasma proteins: Secondary | ICD-10-CM | POA: Diagnosis present

## 2020-10-06 DIAGNOSIS — E878 Other disorders of electrolyte and fluid balance, not elsewhere classified: Secondary | ICD-10-CM | POA: Diagnosis present

## 2020-10-06 DIAGNOSIS — Z8679 Personal history of other diseases of the circulatory system: Secondary | ICD-10-CM

## 2020-10-06 DIAGNOSIS — I252 Old myocardial infarction: Secondary | ICD-10-CM

## 2020-10-06 DIAGNOSIS — N186 End stage renal disease: Secondary | ICD-10-CM | POA: Diagnosis present

## 2020-10-06 DIAGNOSIS — I132 Hypertensive heart and chronic kidney disease with heart failure and with stage 5 chronic kidney disease, or end stage renal disease: Secondary | ICD-10-CM | POA: Diagnosis present

## 2020-10-06 DIAGNOSIS — D631 Anemia in chronic kidney disease: Secondary | ICD-10-CM | POA: Diagnosis present

## 2020-10-06 DIAGNOSIS — Z7982 Long term (current) use of aspirin: Secondary | ICD-10-CM

## 2020-10-06 DIAGNOSIS — I5032 Chronic diastolic (congestive) heart failure: Secondary | ICD-10-CM | POA: Diagnosis present

## 2020-10-06 DIAGNOSIS — Z20822 Contact with and (suspected) exposure to covid-19: Secondary | ICD-10-CM | POA: Diagnosis present

## 2020-10-06 DIAGNOSIS — G9349 Other encephalopathy: Secondary | ICD-10-CM | POA: Diagnosis present

## 2020-10-06 DIAGNOSIS — I4891 Unspecified atrial fibrillation: Secondary | ICD-10-CM

## 2020-10-06 DIAGNOSIS — I6930 Unspecified sequelae of cerebral infarction: Secondary | ICD-10-CM | POA: Diagnosis not present

## 2020-10-06 DIAGNOSIS — E1122 Type 2 diabetes mellitus with diabetic chronic kidney disease: Secondary | ICD-10-CM | POA: Diagnosis present

## 2020-10-06 DIAGNOSIS — R569 Unspecified convulsions: Secondary | ICD-10-CM | POA: Diagnosis not present

## 2020-10-06 DIAGNOSIS — M1909 Primary osteoarthritis, other specified site: Secondary | ICD-10-CM | POA: Diagnosis present

## 2020-10-06 DIAGNOSIS — L98499 Non-pressure chronic ulcer of skin of other sites with unspecified severity: Secondary | ICD-10-CM | POA: Diagnosis present

## 2020-10-06 DIAGNOSIS — Z681 Body mass index (BMI) 19 or less, adult: Secondary | ICD-10-CM

## 2020-10-06 DIAGNOSIS — Y838 Other surgical procedures as the cause of abnormal reaction of the patient, or of later complication, without mention of misadventure at the time of the procedure: Secondary | ICD-10-CM | POA: Diagnosis present

## 2020-10-06 DIAGNOSIS — Z419 Encounter for procedure for purposes other than remedying health state, unspecified: Secondary | ICD-10-CM

## 2020-10-06 DIAGNOSIS — I721 Aneurysm of artery of upper extremity: Secondary | ICD-10-CM | POA: Diagnosis present

## 2020-10-06 DIAGNOSIS — M898X9 Other specified disorders of bone, unspecified site: Secondary | ICD-10-CM | POA: Diagnosis present

## 2020-10-06 DIAGNOSIS — Z9119 Patient's noncompliance with other medical treatment and regimen: Secondary | ICD-10-CM

## 2020-10-06 DIAGNOSIS — Z993 Dependence on wheelchair: Secondary | ICD-10-CM

## 2020-10-06 DIAGNOSIS — Z79899 Other long term (current) drug therapy: Secondary | ICD-10-CM

## 2020-10-06 DIAGNOSIS — R64 Cachexia: Secondary | ICD-10-CM | POA: Diagnosis present

## 2020-10-06 DIAGNOSIS — R9431 Abnormal electrocardiogram [ECG] [EKG]: Secondary | ICD-10-CM | POA: Diagnosis present

## 2020-10-06 DIAGNOSIS — I214 Non-ST elevation (NSTEMI) myocardial infarction: Secondary | ICD-10-CM | POA: Diagnosis present

## 2020-10-06 DIAGNOSIS — I6523 Occlusion and stenosis of bilateral carotid arteries: Secondary | ICD-10-CM | POA: Diagnosis present

## 2020-10-06 LAB — COMPREHENSIVE METABOLIC PANEL
ALT: 12 U/L (ref 0–44)
AST: 18 U/L (ref 15–41)
Albumin: 4 g/dL (ref 3.5–5.0)
Alkaline Phosphatase: 61 U/L (ref 38–126)
Anion gap: 26 — ABNORMAL HIGH (ref 5–15)
BUN: 20 mg/dL (ref 6–20)
CO2: 25 mmol/L (ref 22–32)
Calcium: 9.1 mg/dL (ref 8.9–10.3)
Chloride: 90 mmol/L — ABNORMAL LOW (ref 98–111)
Creatinine, Ser: 4.75 mg/dL — ABNORMAL HIGH (ref 0.44–1.00)
GFR, Estimated: 10 mL/min — ABNORMAL LOW (ref >60)
Glucose, Bld: 186 mg/dL — ABNORMAL HIGH (ref 70–99)
Potassium: 4 mmol/L (ref 3.5–5.1)
Sodium: 141 mmol/L (ref 135–145)
Total Bilirubin: 0.3 mg/dL (ref 0.3–1.2)
Total Protein: 8.3 g/dL — ABNORMAL HIGH (ref 6.5–8.1)

## 2020-10-06 LAB — CBC
HCT: 46.8 % — ABNORMAL HIGH (ref 36.0–46.0)
Hemoglobin: 14.7 g/dL (ref 12.0–15.0)
MCH: 29.2 pg (ref 26.0–34.0)
MCHC: 31.4 g/dL (ref 30.0–36.0)
MCV: 92.9 fL (ref 80.0–100.0)
Platelets: 170 10*3/uL (ref 150–400)
RBC: 5.04 MIL/uL (ref 3.87–5.11)
RDW: 15 % (ref 11.5–15.5)
WBC: 8.2 10*3/uL (ref 4.0–10.5)
nRBC: 0 % (ref 0.0–0.2)

## 2020-10-06 LAB — DIFFERENTIAL
Abs Immature Granulocytes: 0.12 10*3/uL — ABNORMAL HIGH (ref 0.00–0.07)
Basophils Absolute: 0 10*3/uL (ref 0.0–0.1)
Basophils Relative: 1 %
Eosinophils Absolute: 0 10*3/uL (ref 0.0–0.5)
Eosinophils Relative: 0 %
Immature Granulocytes: 2 %
Lymphocytes Relative: 19 %
Lymphs Abs: 1.6 10*3/uL (ref 0.7–4.0)
Monocytes Absolute: 0.3 10*3/uL (ref 0.1–1.0)
Monocytes Relative: 4 %
Neutro Abs: 6.1 10*3/uL (ref 1.7–7.7)
Neutrophils Relative %: 74 %

## 2020-10-06 LAB — RESP PANEL BY RT-PCR (FLU A&B, COVID) ARPGX2
Influenza A by PCR: NEGATIVE
Influenza B by PCR: NEGATIVE
SARS Coronavirus 2 by RT PCR: NEGATIVE

## 2020-10-06 LAB — PROTIME-INR
INR: 1 (ref 0.8–1.2)
Prothrombin Time: 12.8 seconds (ref 11.4–15.2)

## 2020-10-06 LAB — I-STAT CHEM 8, ED
BUN: 29 mg/dL — ABNORMAL HIGH (ref 6–20)
Calcium, Ion: 0.9 mmol/L — ABNORMAL LOW (ref 1.15–1.40)
Chloride: 95 mmol/L — ABNORMAL LOW (ref 98–111)
Creatinine, Ser: 4.6 mg/dL — ABNORMAL HIGH (ref 0.44–1.00)
Glucose, Bld: 176 mg/dL — ABNORMAL HIGH (ref 70–99)
HCT: 48 % — ABNORMAL HIGH (ref 36.0–46.0)
Hemoglobin: 16.3 g/dL — ABNORMAL HIGH (ref 12.0–15.0)
Potassium: 4.5 mmol/L (ref 3.5–5.1)
Sodium: 137 mmol/L (ref 135–145)
TCO2: 30 mmol/L (ref 22–32)

## 2020-10-06 LAB — APTT: aPTT: 26 seconds (ref 24–36)

## 2020-10-06 LAB — CBG MONITORING, ED: Glucose-Capillary: 128 mg/dL — ABNORMAL HIGH (ref 70–99)

## 2020-10-06 LAB — I-STAT BETA HCG BLOOD, ED (MC, WL, AP ONLY): I-stat hCG, quantitative: <5 m[IU]/mL (ref <5)

## 2020-10-06 LAB — TROPONIN I (HIGH SENSITIVITY)
Troponin I (High Sensitivity): 80 ng/L — ABNORMAL HIGH (ref <18)
Troponin I (High Sensitivity): 210 ng/L (ref <18)

## 2020-10-06 LAB — ETHANOL: Alcohol, Ethyl (B): <10 mg/dL (ref <10)

## 2020-10-06 MED ORDER — HYDRALAZINE HCL 20 MG/ML IJ SOLN
5.0000 mg | INTRAMUSCULAR | Status: DC | PRN
  Administered 2020-10-07: 23:00:00 5 mg via INTRAVENOUS
  Filled 2020-10-06 (×2): qty 1

## 2020-10-06 MED ORDER — ASPIRIN EC 81 MG PO TBEC
81.0000 mg | DELAYED_RELEASE_TABLET | Freq: Every day | ORAL | Status: DC
  Administered 2020-10-08 – 2020-10-17 (×10): 81 mg via ORAL
  Filled 2020-10-06 (×10): qty 1

## 2020-10-06 MED ORDER — ACETAMINOPHEN 160 MG/5ML PO SOLN: 650.0000 mg | ORAL | Status: DC | PRN

## 2020-10-06 MED ORDER — AMIODARONE HCL 200 MG PO TABS
200.0000 mg | ORAL_TABLET | Freq: Every day | ORAL | Status: DC
  Administered 2020-10-06 – 2020-10-17 (×10): 200 mg via ORAL
  Filled 2020-10-06 (×10): qty 1

## 2020-10-06 MED ORDER — DILTIAZEM HCL 25 MG/5ML IV SOLN
10.0000 mg | Freq: Once | INTRAVENOUS | Status: AC
  Administered 2020-10-06: 18:00:00 10 mg via INTRAVENOUS
  Filled 2020-10-06: qty 5

## 2020-10-06 MED ORDER — IOHEXOL 350 MG/ML SOLN
75.0000 mL | Freq: Once | INTRAVENOUS | Status: AC | PRN
  Administered 2020-10-06: 22:00:00 75 mL via INTRAVENOUS

## 2020-10-06 MED ORDER — HEPARIN SODIUM (PORCINE) 5000 UNIT/ML IJ SOLN: 5000.0000 [IU] | Freq: Three times a day (TID) | INTRAMUSCULAR | Status: DC

## 2020-10-06 MED ORDER — ACETAMINOPHEN 650 MG RE SUPP: 650.0000 mg | RECTAL | Status: DC | PRN

## 2020-10-06 MED ORDER — STROKE: EARLY STAGES OF RECOVERY BOOK
Freq: Once | Status: DC
  Filled 2020-10-06: qty 1

## 2020-10-06 MED ORDER — ACETAMINOPHEN 325 MG PO TABS
650.0000 mg | ORAL_TABLET | ORAL | Status: DC | PRN
  Administered 2020-10-13 – 2020-10-15 (×3): 650 mg via ORAL
  Filled 2020-10-06 (×3): qty 2

## 2020-10-06 NOTE — ED Notes (Signed)
Unable to complete MRI r/t aneurysm clips

## 2020-10-06 NOTE — ED Provider Notes (Signed)
[facility] EMERGENCY DEPARTMENT Provider Note   CSN: [id number] Arrival date & time: 10/06/20  1712     History Chief Complaint  Patient presents with  . Code Stroke  . Altered Mental Status    [name] is a 59 y.o. female with a hx of ESRD on dialysis T/Th/Sat, afib on ambiodarone, cerebral artery aneurysm, seizure disorder, subarachnoid hemorrhage, HTN, hyperlipidemia, CAD, and anemia who presents to the ED via EMS from dialysis for evaluation of AMS. History is primarily provided by EMS who relay that they received a call from the dialysis center that around 15:30 following patient's dialysis treatment she became altered, further described as less responsive and more sluggish, typically after dialysis she wants to eat and is ready to go home, this change caused them to call 911. Per EMS in afib w/ RVR @ rate in the 140s, received 500 cc of fluid en route with some mild improvement in HR, BP 90s/60s initially. Patient able to tell me her name, she does admit to headache when asked this, denies chest pain, abdominal pain, dyspnea, confusion, numbness, or weakness, however she does not consistently answer questions. Level 5 caveat applies secondary to AMS. Patient did receive full dialysis tx today.    HPI     Past Medical History:  Diagnosis Date  . Anterior cerebral artery aneurysm    1996 AT [facility]  . Coronary atherosclerosis of native coronary artery    STEMI Feb 2009 - 70% distal LAD lesion c/w plaque rupture, DES distal RCA  . Diastolic heart failure    LVEF 60-65% with grade 2 diastolic dysfunction - July 2014  . End-stage renal disease on hemodialysis ([place]) 02/03/2019  . ESRD (end stage renal disease) on dialysis ([place])   . HTN (hypertension)    Resistant  . Hypercholesterolemia   . Subarachnoid hemorrhage ([place])    03/2012  . Tobacco abuse    Resumed smoking half a pack a day. She was a smoker in the past and states she was abl eto stop in the past  using nicotine patches  . Unspecified atrial fibrillation ([place]) 02/03/2019    Patient Active Problem List   Diagnosis Date Noted  . Headache, unspecified 11/28/2019  . Hypercalcemia 11/10/2019  . Allergy, unspecified, initial encounter 08/08/2019  . Hypokalemia 07/07/2019  . Sepsis, unspecified organism ([place]) 02/03/2019  . End-stage renal disease on hemodialysis ([place]) 02/03/2019  . Unspecified atrial fibrillation ([place]) 02/03/2019  . Hypotension 02/02/2019  . Syncope and collapse   . Palliative care encounter   . Atrial fibrillation with RVR ([place]) 03/20/2018  . Chills (without fever) 03/16/2018  . Iron deficiency anemia, unspecified 02/04/2018  . 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 Anemia in chronic kidney disease 12/30/2017  . Fever, unspecified 12/30/2017  . Generalized abdominal pain 12/30/2017  . Heart failure, unspecified ([place]) 12/30/2017  . Other specified personal risk factors, not elsewhere classified 12/30/2017  . Pure hypercholesterolemia, unspecified 12/30/2017  . Secondary hyperparathyroidism of renal origin ([place]) 12/30/2017  . Shortness of breath 12/30/2017  . History of embolic stroke 25/85/2778  . Altered mental status 11/08/2013  . Acute encephalopathy 11/08/2013  . HTN (hypertension) 11/08/2013  . Hypertensive renal disease 11/06/2013  . Protein-calorie malnutrition, severe ([place]) 11/04/2013  . Seizure disorder ([employer]) 10/22/2013  . Hypertensive urgency 10/20/2013  . Hypertensive heart disease 05/07/2013  . Acute-on-chronic renal failure ([place]) 05/07/2013  . HTN (hypertension), malignant 05/06/2013  . CAD (coronary artery disease) 05/06/2013  . ESRD (end stage renal disease) (  [place]) 05/06/2013  . Chronic diastolic heart failure ([place])   . Tobacco abuse 09/25/2012  . H/O noncompliance with medical treatment, presenting hazards to health   . Renal artery stenosis ([place]) 04/05/2012  . History of subarachnoid hemorrhage 03/03/2012  . Acute on chronic diastolic  heart failure ([place]) 09/19/2008    Past Surgical History:  Procedure Laterality Date  . ABDOMINAL HYSTERECTOMY    . BASCILIC VEIN TRANSPOSITION Right 05/13/2013   Procedure: [place];  Surgeon: Rosetta Posner, MD;  Location: [facility];  Service: Vascular;  Laterality: Right;  . BRAIN SURGERY     2   . LOOP RECORDER IMPLANT N/A 11/14/2013   Procedure: LOOP RECORDER IMPLANT;  Surgeon: Evans Lance, MD;  Location: [facility] CATH LAB;  Service: Cardiovascular;  Laterality: N/A;  . TEE WITHOUT CARDIOVERSION N/A 11/14/2013   Procedure: TRANSESOPHAGEAL ECHOCARDIOGRAM (TEE);  Surgeon: Candee Furbish, MD;  Location[facility] ENDOSCOPY;  Service: Cardiovascular;  Laterality: N/A;  . VENTRICULOPERITONEAL SHUNT  03/27/2012   Procedure: SHUNT INSERTION VENTRICULAR-PERITONEAL;  Surgeon: Winfield Cunas, MD;  Location: [facility] NEURO ORS;  Service: Neurosurgery;  Laterality: N/A;  Ventricular-Peritoneal Shunt Insertion     OB History   No obstetric history on file.     Family History  Problem Relation Age of Onset  . Heart disease Mother   . Heart disease Father   . Coronary artery disease Other        Premature in 1st degree relatives    Social History   Tobacco Use  . Smoking status: Former Smoker    Packs/day: 0.20    Years: 35.00    Pack years: 7.00    Types: Cigarettes    Quit date: 03/20/2017    Years since quitting: 3.5  . Smokeless tobacco: Never Used  . Tobacco comment: 1/2 ppd   Vaping Use  . Vaping Use: Never used  Substance Use Topics  . Alcohol use: No  . Drug use: No    Home Medications Prior to Admission medications   Medication Sig Start Date End Date Taking? Authorizing Provider  amiodarone (PACERONE) 200 MG tablet Take 2 tabs (400mg ) daily for 7 days, then 1 tab (200mg ) daily thereafter 03/24/18   Rai, Vernelle Emerald, MD  aspirin 81 MG EC tablet Take by mouth. 12/31/17   [provider]  calcitRIOL (ROCALTROL) 0.5 MCG capsule Take 1 capsule (0.5 mcg total) by mouth Every  Tuesday,Thursday,and Saturday with dialysis. 02/05/19   Hongalgi, Lenis Dickinson, MD  calcium acetate (PHOSLO) 667 MG capsule Take 667 mg by mouth 3 (three) times daily. 11/28/19   [provider]  cinacalcet (SENSIPAR) 30 MG tablet Take 2 tablets (60 mg total) by mouth Every Tuesday,Thursday,and Saturday with dialysis. 02/05/19   Hongalgi, Lenis Dickinson, MD  iron sucrose in sodium chloride 0.9 % 100 mL Iron Sucrose (Venofer) 11/24/19 11/22/20  [provider]  lactulose (CHRONULAC) 10 GM/15ML solution Take 10 g by mouth daily as needed for moderate constipation.     [provider]  levETIRAcetam (KEPPRA) 500 MG tablet Take 500 mg by mouth 2 (two) times daily.    [provider]  multivitamin (RENA-VIT) TABS tablet Take 1 tablet by mouth daily.    [provider]  nitroGLYCERIN (NITROSTAT) 0.4 MG SL tablet Place 1 tablet (0.4 mg total) under the tongue every 5 (five) minutes as needed for chest pain. 05/18/13   Janece Canterbury, MD  omeprazole (PRILOSEC) 20 MG capsule Take 20 mg by mouth daily.  [provider]  sucroferric oxyhydroxide (VELPHORO) 500 MG chewable tablet Chew 2 tablets (1,000 mg total) by mouth 3 (three) times daily with meals. 02/04/19   Hongalgi, Lenis Dickinson, MD    Allergies    Ampicillin and Penicillins  Review of Systems   Review of Systems  Unable to perform ROS: Mental status change    Physical Exam Updated Vital Signs BP (!) 157/74   Pulse 71   Temp 97.6 F (36.4 C) (Oral)   Resp 18   SpO2 100%   Physical Exam Vitals and nursing note reviewed.  Constitutional:      Comments: Chronically ill-appearing.  Thin appearing.  HENT:     Head: Normocephalic and atraumatic.  Eyes:     Pupils: Pupils are equal, round, and reactive to light.  Cardiovascular:     Rate and Rhythm: Tachycardia present. Rhythm irregular.  Pulmonary:     Effort: Pulmonary effort is normal. No respiratory distress.     Breath sounds: Normal breath sounds. No  wheezing, rhonchi or rales.  Abdominal:     General: There is no distension.     Palpations: Abdomen is soft.     Tenderness: There is no abdominal tenderness. There is no guarding or rebound.  Musculoskeletal:     Cervical back: Neck supple. No rigidity.     Right lower leg: No edema.     Left lower leg: No edema.     Comments: Right proximal upper extremity with AV fistula in place, palpable thrill present.  Skin:    General: Skin is warm and dry.  Neurological:     Mental Status: She is alert.     Comments: Alert.  Intermittently following commands/answering questions.  Symmetric grip strength.  Able to move all extremities some.  Limited assessment due to patient not consistently following commands. GCS 13, Verbal 3, Motor 6, Eyes 4.      ED Results / Procedures / Treatments   Labs (all labs ordered are listed, but only abnormal results are displayed) Labs Reviewed  CBC - Abnormal; Notable for the following components:      Result Value   HCT 46.8 (*)    All other components within normal limits  DIFFERENTIAL - Abnormal; Notable for the following components:   Abs Immature Granulocytes 0.12 (*)    All other components within normal limits  COMPREHENSIVE METABOLIC PANEL - Abnormal; Notable for the following components:   Chloride 90 (*)    Glucose, Bld 186 (*)    Creatinine, Ser 4.75 (*)    Total Protein 8.3 (*)    GFR, Estimated 10 (*)    Anion gap 26 (*)    All other components within normal limits  I-STAT CHEM 8, ED - Abnormal; Notable for the following components:   Chloride 95 (*)    BUN 29 (*)    Creatinine, Ser 4.60 (*)    Glucose, Bld 176 (*)    Calcium, Ion 0.90 (*)    Hemoglobin 16.3 (*)    HCT 48.0 (*)    All other components within normal limits  CBG MONITORING, ED - Abnormal; Notable for the following components:   Glucose-Capillary 128 (*)    All other components within normal limits  TROPONIN I (HIGH SENSITIVITY) - Abnormal; Notable for the following  components:   Troponin I (High Sensitivity) 80 (*)    All other components within normal limits  RESP PANEL BY RT-PCR (FLU A&B, COVID) ARPGX2  ETHANOL  PROTIME-INR  APTT  I-STAT BETA HCG BLOOD, ED (MC, WL, AP ONLY)  TROPONIN I (HIGH SENSITIVITY)    EKG EKG Interpretation  Date/Time:  Saturday October 06 2020 17:13:41 EST Ventricular Rate:  145 PR Interval:    QRS Duration: 90 QT Interval:  322 QTC Calculation: 501 R Axis:   69 Text Interpretation: Atrial fibrillation Ventricular premature complex LVH with secondary repolarization abnormality ST depression, consider ischemia, diffuse lds Minimal ST elevation, lateral leads Borderline prolonged QT interval Artifact in lead(s) I II III aVR aVL aVF V1 V4 V5 V6 Confirmed by Octaviano Glow [phone]) on 10/06/2020 5:25:55 PM   Radiology CT HEAD CODE STROKE WO CONTRAST  Result Date: 10/06/2020 CLINICAL DATA:  Code stroke.  Weakness EXAM: CT HEAD WITHOUT CONTRAST TECHNIQUE: Contiguous axial images were obtained from the base of the skull through the vertex without intravenous contrast. COMPARISON:  02/16/2018 FINDINGS: Brain: Old small vessel cerebellar infarctions as seen previously. Cerebral hemispheres show old infarction in the left parietal lobe which is unchanged. Extensive chronic small-vessel ischemic changes of the white matter as seen previously. Old infarction in the left basal ganglia. Right VP shunt placed from a frontal approach enters the right lateral ventricle, extends through the foramen of Monro into the third ventricle and has its tip in the region of the left midbrain. 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 Previous aneurysm clip at the base of the brain on the right. No sign of acute infarction, hemorrhage, hydrocephalus or extra-axial collection. Vascular: There is atherosclerotic calcification of the major vessels at the base of the brain. Skull: Craniotomy on the right in the distant past. No acute finding.  Sinuses/Orbits: Clear/normal Other: None IMPRESSION: 1. No acute finding by CT. Extensive chronic small-vessel ischemic changes throughout the brain as seen previously. 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 2. Right VP shunt in place. No hydrocephalus. 3. These results were communicated to [facility] at 5:43 pmon 12/18/2021by text page via the [facility] messaging system. Electronically Signed   By: Nelson Chimes M.D.   On: 10/06/2020 17:44    Procedures Procedures  Medications Ordered in ED Medications  amiodarone (PACERONE) tablet 200 mg (200 mg Oral Given 10/06/20 1947)  diltiazem (CARDIZEM) injection 10 mg (10 mg Intravenous Given 10/06/20 1756)    ED Course  I have reviewed the triage vital signs and the nursing notes.  Pertinent labs & imaging results that were available during my care of the patient were reviewed by me and considered in my medical decision making (see chart for details).  Clinical Course as of 10/06/20 1949  Sat Oct 06, 2020  1726 ECG appears to show new onset A Fib, HR 130's. [MT]  1 Briefly this is a 59 year old female who lives with her daughter, PMH of ESRD on TTS HD, HTN, anterior cerebral artery aneurysm with SAH/ICH x2 with VP shunt placement, seizure disorder, CAD, grade 2 diastolic dysfunction, A. fib with RVR,Secondary hyperparathyroidism, presenting to ED with confusion after dialysis.  She completed her usual dialysis today then around 3:30 the staff noted that she appeared lethargic and confused.  She would not verbalize for them or EMS, nor follow commands.  ON arrival she will only tell me that she has a headache when asked specifically, but she will move all her extremities.  GCS 13, Verbal 3, Motor 6, Eyes 4.   [MT]  9147 Code stroke called based on timing of events, poor cooperation on neurological exam, new onset A Fib, and hx of aneurysm -  overall pt is high risk for both bleeds and embolic events.  Taken to CT now.  PA provider has spoken to  daughter - see provider note.   [MT]  1947 Now in NSR, daughter incoming to hospital [MT]    Clinical Course User Index [MT] Trifan, Carola Rhine, MD   MDM Rules/Calculators/A&P                         Patient presents to the ED for evaluation of altered mental status that began at 1530 after completing dialysis today.  On arrival patient is chronically ill-appearing, she is alert, able to tell me her name, however does not consistently answer questions/follow commands.  She moves each of her extremities some.  Given limited history/neuro exam and acute onset altered mental status as well as prior subarachnoid hemorrhage, & afib patient overall high risk for embolic source/bleed, ultimately decision made to activate code stroke. Patient also noted to be in A. fib with RVR currently, does have a history of this, not on anticoagulation or rate control, does take amiodarone at home, initial heart rate 130s, will continue to monitor and trend blood pressure, will likely need rate controlling medications.  Patient is not anticoagulated.  Additional history obtained:  Additional history obtained from chart review, nursing note review, EMS, and patient's daughter..  Following code stroke activation I called and spoke with patient's daughter Ms. Bolton who relates that at baseline her mother does not complete sentences very well, slurs her words at times, but is ambulatory and can follow commands.  She states that if the day prior to dialysis the patient overeat/drinks and she is not monitored she frequently does not feel well after dialysis is very fatigued, sleepy, and does not participate in activities.   Lab Tests:  I Ordered, reviewed, and interpreted labs, which included:  CBC, differential, CMP, APTT, PT/INR, ethanol level, troponin: Overall appears fairly baseline for the patient, no specific derangements that I suspect would lead to her to have altered mental status, consistent with ESRD, her troponin  is elevated at 80 however this is in the setting of dialysis patient.   Imaging Studies ordered:  I ordered imaging studies which included CT head wo contrast, I independently visualized and interpreted imaging which showed 1. No acute finding by CT. Extensive chronic small-vessel ischemic changes throughout the brain as seen previously. 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 2. Right VP shunt in place. No hydrocephalus.  Code stroke cancelled by neurology team.   ED Course:  Heart rate increasing into the 829H, systolic blood pressure greater than 100, 10 mg of Cardizem ordered. We will continue to monitor.  HR improving some S/p cardizem.   19:00: On reassessment of the patient she seems more awake, able to answer questions more appropriately, and able to follow commands.  Per my review of the monitor she appears to be in normal sinus rhythm.  I obtained an EKG which confirms this.  Troponin uptrending, patient without chest pain, continue to monitor, was in A. fib for several hours, this may be contributing.  Reviewed neurologist note Plan: - MRI brain without contrast in not contraindicated. -Recommend vascular imagingwith MRA head and neck or CTA head neck. -RecommendTTE. -Recommend labs: HbA1c, lipid panel, TSH. -RecommendStatinif LDL > 70 - Continue aspirin 81mg  daily. - SBP <180 for now. - Telemetry monitoring for arrhythmia. -Recommend bedsideSwallow screen. -RecommendStroke education. -RecommendPT/OT/SLP consult. - Routine EEG to eval for any epileptogenic  discharges - Ordered. - Consider metabolic/infectious workup.  Imaging ordered to further assess per neurology recommendations, unable to get MRI due to aneurysm clips.  Will discuss with the hospital service for admission at this time. Patient and her daughter updated on results and plan of care, they are in agreement.  22:05: CONSULT: Discussed with hospitalist Dr. Marlowe Sax, accepts  admission.  This is a shared visit with supervising physician Dr. Langston Masker who has independently evaluated patient & provided guidance in evaluation/management/disposition, in agreement with care   Portions of this note were generated with Dragon dictation software. Dictation errors may occur despite best attempts at proofreading.  Final Clinical Impression(s) / ED Diagnoses Final diagnoses:  Altered mental status, unspecified altered mental status type  Atrial fibrillation with rapid ventricular response [facility])    Rx / DC Orders ED Discharge Orders    None       Amaryllis Dyke, PA-C 10/06/20 2323    Wyvonnia Dusky, MD 10/07/20 [phone]

## 2020-10-06 NOTE — Progress Notes (Signed)
Patient had one dialysis needle in fistula which was removed. Pressure held for 15 minutes and bleeding stopped. Gauze dressing applied, clean, dry, and intact. Nurse and PA made aware that site looks very enlarged and unsure if this is baseline for patient.

## 2020-10-06 NOTE — ED Triage Notes (Signed)
Pt arrived at 1712 via [facility] from dialysis after her treatment become less responsive and not following commands or speaking. On arrival to ED EDP activated code stroke.  Pt taken to CT by this RN, on arrival to ERE pt began to speak and was able to state her name with clear speak, moving all extremities equally. Pt is confused on place time and situation.

## 2020-10-06 NOTE — H&P (Addendum)
History and Physical    Santrice Muzio HMC:[id number] DOB: [birth year] DOA: 10/06/2020  PCP: Garwin Brothers, MD Patient coming from: Dialysis center  Chief Complaint: Altered mental status  HPI: [name] is a 59 y.o. female with medical history significant of ESRD on HD, hypertension, stroke, ruptured cerebral aneurysm status post coiling and shunt, seizure disorder, type 2 diabetes, CAD with stents, chronic diastolic CHF, hyperlipidemia, A. fib being brought to the ED via EMS from her dialysis center for evaluation of decreased responsiveness, not following commands or speaking after her treatment.  Patient is currently oriented to self only.  Somnolent but easily arousable.  She does not know why she is here.  Denies fevers, chills, cough, shortness of breath, chest pain, nausea, vomiting, abdominal pain, or diarrhea.  States she goes for dialysis but still makes urine.  No additional history could be obtained from her.  ED Course: On arrival to the ED code stroke was activated and patient was seen by neurology.  Symptoms resolved soon after and code stroke was canceled by neurology.  Head CT negative for acute finding.  Admission for further work-up recommended.  CTA head and neck negative for acute large or medium vessel occlusion.  Atherosclerotic disease of both carotid bifurcations.  No measurable stenosis on the right.  50% stenosis of the ICA bulb on the left.  50% stenosis of both vertebral artery origins.  Additional stenosis along the cervical course of both vertebral arteries.  50% stenosis of the right vertebral artery V4 segment.  50% stenosis of both carotid siphon regions.  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  Aneurysm clips at the right MCA bifurcation and supraclinoid ICA region; no sign of residual aneurysmal flow.  In A. fib with rate up to 150s.  High sensitive troponin 80 >210.  Converted to sinus rhythm after she was given  IV Cardizem 10 mg and a dose of her home amiodarone.  WBC 8.2, hemoglobin 14.7, hematocrit 46.8, platelet 170K.  Sodium 141, potassium 4.0, chloride 90, bicarb 25, BUN 20, creatinine 4.7, glucose 186.  INR 1.0.  Screening SARS-CoV-2 PCR test and influenza panel both negative.  Blood ethanol level undetectable.  Review of Systems:  All systems reviewed and apart from history of presenting illness, are negative.  Past Medical History:  Diagnosis Date  . Anterior cerebral artery aneurysm    1996 AT [facility]  . Coronary atherosclerosis of native coronary artery    STEMI Feb 2009 - 70% distal LAD lesion c/w plaque rupture, DES distal RCA  . Diastolic heart failure    LVEF 60-65% with grade 2 diastolic dysfunction - July 2014  . End-stage renal disease on hemodialysis ([place]) 02/03/2019  . ESRD (end stage renal disease) on dialysis ([place])   . HTN (hypertension)    Resistant  . Hypercholesterolemia   . Subarachnoid hemorrhage ([place])    03/2012  . Tobacco abuse    Resumed smoking half a pack a day. She was a smoker in the past and states she was abl eto stop in the past using nicotine patches  . Unspecified atrial fibrillation ([place]) 02/03/2019    Past Surgical History:  Procedure Laterality Date  . ABDOMINAL HYSTERECTOMY    . BASCILIC VEIN TRANSPOSITION Right 05/13/2013   Procedure: [place];  Surgeon: Rosetta Posner, MD;  Location: [place];  Service: Vascular;  Laterality: Right;  . BRAIN SURGERY     2   . LOOP  RECORDER IMPLANT N/A 11/14/2013   Procedure: LOOP RECORDER IMPLANT;  Surgeon: Evans Lance, MD;  [facility] C[facility] LAB;  Service: Cardiovascular;  Laterality: N/A;  . TEE WITHOUT CARDIOVERSION N/A 11/14/2013   Procedure: TRANSESOPHAGEAL ECHOCARDIOGRAM (TEE);  Surgeon: Candee Furbish, MD;  Location: [facility] ENDOSCOPY;  Service: Cardiovascular;  Laterality: N/A;  . VENTRICULOPERITONEAL SHUNT  03/27/2012   Procedure: SHUNT INSERTION VENTRICULAR-PERITONEAL;  Surgeon: Winfield Cunas, MD;   Location: [place] NEURO ORS;  Service: Neurosurgery;  Laterality: N/A;  Ventricular-Peritoneal Shunt Insertion     reports that she quit smoking about 3 years ago. Her smoking use included cigarettes. She has a 7.00 pack-year smoking history. She has never used smokeless tobacco. She reports that she does not drink alcohol and does not use drugs.  Allergies  Allergen Reactions  . Ampicillin Hives and Rash    Has patient had a PCN reaction causing immediate rash, facial/tongue/throat swelling, SOB or lightheadedness with hypotension: No Has patient had a PCN reaction causing severe rash involving mucus membranes or skin necrosis: No Has patient had a PCN reaction that required hospitalization: No Has patient had a PCN reaction occurring within the last 10 years: No If all of the above answers are "NO", then may proceed with Cephalosporin use.   Marland Kitchen Penicillins Hives and Rash    Has patient had a PCN reaction causing immediate rash, facial/tongue/throat swelling, SOB or lightheadedness with hypotension: Yes Has patient had a PCN reaction causing severe rash involving mucus membranes or skin necrosis: No Has patient had a PCN reaction that required hospitalization: No Has patient had a PCN reaction occurring within the last 10 years: No If all of the above answers are "NO", then may proceed with Cephalosporin use.     Family History  Problem Relation Age of Onset  . Heart disease Mother   . Heart disease Father   . Coronary artery disease Other        Premature in 1st degree relatives    Prior to Admission medications   Medication Sig Start Date End Date Taking? Authorizing Provider  amiodarone (PACERONE) 200 MG tablet Take 2 tabs (400mg ) daily for 7 days, then 1 tab (200mg ) daily thereafter 03/24/18   Rai, Vernelle Emerald, MD  aspirin 81 MG EC tablet Take by mouth. 12/31/17   [provider]  calcitRIOL (ROCALTROL) 0.5 MCG capsule Take 1 capsule (0.5 mcg total) by mouth Every  Tuesday,Thursday,and Saturday with dialysis. 02/05/19   Hongalgi, Lenis Dickinson, MD  calcium acetate (PHOSLO) 667 MG capsule Take 667 mg by mouth 3 (three) times daily. 11/28/19   [provider]  cinacalcet (SENSIPAR) 30 MG tablet Take 2 tablets (60 mg total) by mouth Every Tuesday,Thursday,and Saturday with dialysis. 02/05/19   Hongalgi, Lenis Dickinson, MD  iron sucrose in sodium chloride 0.9 % 100 mL Iron Sucrose (Venofer) 11/24/19 11/22/20  [provider]  lactulose (CHRONULAC) 10 GM/15ML solution Take 10 g by mouth daily as needed for moderate constipation.     [provider]  levETIRAcetam (KEPPRA) 500 MG tablet Take 500 mg by mouth 2 (two) times daily.    [provider]  multivitamin (RENA-VIT) TABS tablet Take 1 tablet by mouth daily.    [provider]  nitroGLYCERIN (NITROSTAT) 0.4 MG SL tablet Place 1 tablet (0.4 mg total) under the tongue every 5 (five) minutes as needed for chest pain. 05/18/13   Janece Canterbury, MD  omeprazole (PRILOSEC) 20 MG capsule Take 20 mg by mouth daily.  [provider]  sucroferric oxyhydroxide (VELPHORO) 500 MG chewable tablet Chew 2 tablets (1,000 mg total) by mouth 3 (three) times daily with meals. 02/04/19   Modena Jansky, MD    Physical Exam: Vitals:   10/06/20 2030 10/06/20 2100 10/06/20 2115 10/06/20 2208  BP: (!) 144/78 (!) 141/93 (!) 146/65 (!) 147/75  Pulse: 75 78 76 74  Resp: 20 15 19 17   Temp:      TempSrc:      SpO2: 100% 100% 100% 100%    Physical Exam Constitutional:      General: She is not in acute distress. HENT:     Head: Normocephalic and atraumatic.  Eyes:     General:        Right eye: No discharge.        Left eye: No discharge.     Conjunctiva/sclera: Conjunctivae normal.  Cardiovascular:     Rate and Rhythm: Normal rate and regular rhythm.     Pulses: Normal pulses.  Pulmonary:     Effort: Pulmonary effort is normal. No respiratory distress.     Breath sounds: Normal breath  sounds. No wheezing or rales.  Abdominal:     General: Bowel sounds are normal. There is no distension.     Palpations: Abdomen is soft.     Tenderness: There is no abdominal tenderness.  Musculoskeletal:        General: No swelling or tenderness.     Cervical back: Normal range of motion and neck supple.  Skin:    General: Skin is warm and dry.  Neurological:     Mental Status: She is alert.     Comments: Somnolent but easily arousable Oriented to self only Moving all extremities spontaneously; no focal weakness     Labs on Admission: I have personally reviewed following labs and imaging studies  CBC: Recent Labs  Lab 10/06/20 1726 10/06/20 1728  WBC  --  8.2  NEUTROABS  --  6.1  HGB 16.3* 14.7  HCT 48.0* 46.8*  MCV  --  92.9  PLT  --  259   Basic Metabolic Panel: Recent Labs  Lab 10/06/20 1726 10/06/20 1728  NA 137 141  K 4.5 4.0  CL 95* 90*  CO2  --  25  GLUCOSE 176* 186*  BUN 29* 20  CREATININE 4.60* 4.75*  CALCIUM  --  9.1   GFR: CrCl cannot be calculated (Unknown ideal weight.). Liver Function Tests: Recent Labs  Lab 10/06/20 1728  AST 18  ALT 12  ALKPHOS 61  BILITOT 0.3  PROT 8.3*  ALBUMIN 4.0   No results for input(s): LIPASE, AMYLASE in the last 168 hours. No results for input(s): AMMONIA in the last 168 hours. Coagulation Profile: Recent Labs  Lab 10/06/20 1728  INR 1.0   Cardiac Enzymes: No results for input(s): CKTOTAL, CKMB, CKMBINDEX, TROPONINI in the last 168 hours. BNP (last 3 results) No results for input(s): PROBNP in the last 8760 hours. HbA1C: No results for input(s): HGBA1C in the last 72 hours. CBG: Recent Labs  Lab 10/06/20 1721  GLUCAP 128*   Lipid Profile: No results for input(s): CHOL, HDL, LDLCALC, TRIG, CHOLHDL, LDLDIRECT in the last 72 hours. Thyroid Function Tests: No results for input(s): TSH, T4TOTAL, FREET4, T3FREE, THYROIDAB in the last 72 hours. Anemia Panel: No results for input(s): VITAMINB12,  FOLATE, FERRITIN, TIBC, IRON, RETICCTPCT in the last 72 hours. Urine analysis:    Component Value Date/Time   COLORURINE YELLOW 11/08/2013 1304  APPEARANCEUR CLEAR 11/08/2013 1304   LABSPEC 1.009 11/08/2013 1304   PHURINE 7.5 11/08/2013 1304   GLUCOSEU NEGATIVE 11/08/2013 1304   HGBUR NEGATIVE 11/08/2013 1304   BILIRUBINUR NEGATIVE 11/08/2013 1304   KETONESUR NEGATIVE 11/08/2013 1304   PROTEINUR NEGATIVE 11/08/2013 1304   UROBILINOGEN 0.2 11/08/2013 1304   NITRITE NEGATIVE 11/08/2013 1304   LEUKOCYTESUR NEGATIVE 11/08/2013 1304    Radiological Exams on Admission: CT Angio Head W/Cm &/Or Wo Cm  Result Date: 10/06/2020 CLINICAL DATA:  Weakness.  Acute stroke presentation. EXAM: CT ANGIOGRAPHY HEAD AND NECK TECHNIQUE: Multidetector CT imaging of the head and neck was performed using the standard protocol during bolus administration of intravenous contrast. Multiplanar CT image reconstructions and MIPs were obtained to evaluate the vascular anatomy. Carotid stenosis measurements (when applicable) are obtained utilizing NASCET criteria, using the distal internal carotid diameter as the denominator. CONTRAST:  4mL OMNIPAQUE IOHEXOL 350 MG/ML SOLN COMPARISON:  Head CT same day. FINDINGS: CTA NECK FINDINGS Aortic arch: Aortic atherosclerosis. No flow limiting stenosis of the brachiocephalic vessel origins. Right carotid system: Common carotid artery shows atherosclerotic calcification but is patent to the bifurcation. Calcified plaque at the carotid bifurcation and ICA bulb. Minimal diameter of the distal bulb is 4 mm. Compared to a more distal cervical ICA diameter of 4 mm, there is no stenosis. Left carotid system: Common carotid artery shows scattered atherosclerotic plaque but is patent to the bifurcation. Calcified plaque at the carotid bifurcation and ICA bulb. Minimal diameter in the ICA bulb measures 2 mm. Compared to a more distal cervical ICA diameter of 4 mm, this indicates a 50% stenosis.  Vertebral arteries: Calcified plaque at both vertebral artery origins. Estimated 50% stenosis at both vertebral artery origins. Other areas of calcified plaque within both vertebral arteries present additional stenoses, more severe on the right than the left. Both vessels do remain patent through the cervical region to the foramen magnum. Skeleton: Ordinary cervical spondylosis. Other neck: Enlarged heterogeneous thyroid gland. Dominant nodule inferior left measuring at least 3 cm in diameter. Upper chest: No active disease. Review of the MIP images confirms the above findings CTA HEAD FINDINGS Anterior circulation: Both internal carotid arteries are patent through the skull base and siphon regions. Extensive siphon atherosclerotic calcification with stenosis estimated at 50% on both sides. Aneurysm clip adjacent to the supraclinoid ICA on the right. No acute large or medium vessel occlusion identified. Second aneurysm clip at the right MCA bifurcation region. Posterior circulation: Both vertebral arteries are patent through the foramen magnum. 50% stenosis of the right vertebral artery V4 segment. Both vertebral arteries reach the basilar. Basilar artery shows some atherosclerotic narrowing and irregularity without a severe stenosis. 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 No sign of recurrent flow or coil compaction. Venous sinuses: Patent and normal. Anatomic variants: None other significant. Review of the MIP images confirms the above findings IMPRESSION: 1. No acute large or medium vessel occlusion. 2. Aortic atherosclerosis. 3. Atherosclerotic disease at both carotid bifurcations. No measurable stenosis on the right. 50% stenosis of the ICA bulb on the left. 4. 50% stenosis at both vertebral artery origins. Additional stenoses along the cervical course of both vertebral arteries. 50% stenosis of the right vertebral artery V4 segment. 5. 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 No sign of  recurrent flow or coil compaction. 6. Aneurysm clips at the right MCA bifurcation and supraclinoid ICA region. No sign of residual aneurysmal flow. 7. 50% stenosis of both carotid siphon  regions. 8. Enlarged heterogeneous thyroid gland with dominant nodule on the left measuring at least 3 cm in diameter. Recommend thyroid US (ref: J Am Coll Radiol. 2015 Feb;12(2): 143-50). Aortic Atherosclerosis (ICD10-I70.0). Electronically Signed   By: Nelson Chimes M.D.   On: 10/06/2020 22:03   CT Angio Neck W and/or Wo Contrast  Result Date: 10/06/2020 CLINICAL DATA:  Weakness.  Acute stroke presentation. EXAM: CT ANGIOGRAPHY HEAD AND NECK TECHNIQUE: Multidetector CT imaging of the head and neck was performed using the standard protocol during bolus administration of intravenous contrast. Multiplanar CT image reconstructions and MIPs were obtained to evaluate the vascular anatomy. Carotid stenosis measurements (when applicable) are obtained utilizing NASCET criteria, using the distal internal carotid diameter as the denominator. CONTRAST:  56mL OMNIPAQUE IOHEXOL 350 MG/ML SOLN COMPARISON:  Head CT same day. FINDINGS: CTA NECK FINDINGS Aortic arch: Aortic atherosclerosis. No flow limiting stenosis of the brachiocephalic vessel origins. Right carotid system: Common carotid artery shows atherosclerotic calcification but is patent to the bifurcation. Calcified plaque at the carotid bifurcation and ICA bulb. Minimal diameter of the distal bulb is 4 mm. Compared to a more distal cervical ICA diameter of 4 mm, there is no stenosis. Left carotid system: Common carotid artery shows scattered atherosclerotic plaque but is patent to the bifurcation. Calcified plaque at the carotid bifurcation and ICA bulb. Minimal diameter in the ICA bulb measures 2 mm. Compared to a more distal cervical ICA diameter of 4 mm, this indicates a 50% stenosis. Vertebral arteries: Calcified plaque at both vertebral artery origins. Estimated 50% stenosis at  both vertebral artery origins. Other areas of calcified plaque within both vertebral arteries present additional stenoses, more severe on the right than the left. Both vessels do remain patent through the cervical region to the foramen magnum. Skeleton: Ordinary cervical spondylosis. Other neck: Enlarged heterogeneous thyroid gland. Dominant nodule inferior left measuring at least 3 cm in diameter. Upper chest: No active disease. Review of the MIP images confirms the above findings CTA HEAD FINDINGS Anterior circulation: Both internal carotid arteries are patent through the skull base and siphon regions. Extensive siphon atherosclerotic calcification with stenosis estimated at 50% on both sides. Aneurysm clip adjacent to the supraclinoid ICA on the right. No acute large or medium vessel occlusion identified. Second aneurysm clip at the right MCA bifurcation region. Posterior circulation: Both vertebral arteries are patent through the foramen magnum. 50% stenosis of the right vertebral artery V4 segment. Both vertebral arteries reach the basilar. Basilar artery shows some atherosclerotic narrowing and irregularity without a severe stenosis. 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 No sign of recurrent flow or coil compaction. Venous sinuses: Patent and normal. Anatomic variants: None other significant. Review of the MIP images confirms the above findings IMPRESSION: 1. No acute large or medium vessel occlusion. 2. Aortic atherosclerosis. 3. Atherosclerotic disease at both carotid bifurcations. No measurable stenosis on the right. 50% stenosis of the ICA bulb on the left. 4. 50% stenosis at both vertebral artery origins. Additional stenoses along the cervical course of both vertebral arteries. 50% stenosis of the right vertebral artery V4 segment. 5. 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 No sign of recurrent flow or coil compaction. 6. Aneurysm clips at the right MCA bifurcation and supraclinoid  ICA region. No sign of residual aneurysmal flow. 7. 50% stenosis of both carotid siphon regions. 8. Enlarged heterogeneous thyroid gland with dominant nodule on the left measuring at least 3 cm in diameter. Recommend thyroid US (  ref: J Am Coll Radiol. 2015 Feb;12(2): 143-50). Aortic Atherosclerosis (ICD10-I70.0). Electronically Signed   By: Nelson Chimes M.D.   On: 10/06/2020 22:03   CT HEAD CODE STROKE WO CONTRAST  Result Date: 10/06/2020 CLINICAL DATA:  Code stroke.  Weakness EXAM: CT HEAD WITHOUT CONTRAST TECHNIQUE: Contiguous axial images were obtained from the base of the skull through the vertex without intravenous contrast. COMPARISON:  02/16/2018 FINDINGS: Brain: Old small vessel cerebellar infarctions as seen previously. Cerebral hemispheres show old infarction in the left parietal lobe which is unchanged. Extensive chronic small-vessel ischemic changes of the white matter as seen previously. Old infarction in the left basal ganglia. Right VP shunt placed from a frontal approach enters the right lateral ventricle, extends through the foramen of [place] into the third ventricle and has its tip in the region of the left midbrain. 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 Previous aneurysm clip at the base of the brain on the right. No sign of acute infarction, hemorrhage, hydrocephalus or extra-axial collection. Vascular: There is atherosclerotic calcification of the major vessels at the base of the brain. Skull: Craniotomy on the right in the distant past. No acute finding. Sinuses/Orbits: Clear/normal Other: None IMPRESSION: 1. No acute finding by CT. Extensive chronic small-vessel ischemic changes throughout the brain as seen previously. 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 2. Right VP shunt in place. No hydrocephalus. 3. These results were communicated to [facility] at 5:43 pmon 12/18/2021by text page via the [facility] messaging system. Electronically Signed   By: Nelson Chimes M.D.    On: 10/06/2020 17:44    EKG: Independently reviewed.  Initial EKG showing A. fib with RVR and ST changes in inferior and lateral leads.  Repeat EKG showing sinus rhythm and no significant change since prior tracing from April 2020.  Assessment/Plan Principal Problem:   AMS (altered mental status) Active Problems:   ESRD (end stage renal disease) (HCC)   HTN (hypertension)   Atrial fibrillation with rapid ventricular response (HCC)   Elevated troponin   Acute encephalopathy -Symptoms started after dialysis. Currently somnolent but easily arousable.  Oriented to self only. -Blood ethanol level undetectable. -Head CT negative for acute finding.   -CTA head and neck negative for acute large or medium vessel occlusion.  Atherosclerotic disease of both carotid bifurcations.  No measurable stenosis on the right.  50% stenosis of the ICA bulb on the left.  50% stenosis of both vertebral artery origins.  Additional stenosis along the cervical course of both vertebral arteries.  50% stenosis of the right vertebral artery V4 segment.  50% stenosis of both carotid siphon regions.  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  Aneurysm clips at the right MCA bifurcation and supraclinoid ICA region; no sign of residual aneurysmal flow. -Underlying infectious etiology less likely given no fever or leukocytosis.  Chest x-ray and UA ordered. -Differentials include disequilibrium syndrome, seizure, and TIA.  Neurology recommended further work-up as mentioned below: -MRI brain contraindicated due to presence of aneurysm clips.  Discussed with radiology. -Transthoracic echo -A1c, fasting lipid panel, TSH -Neurology recommending starting statin if LDL >70 -Continue aspirin 81 mg daily per neurology recommendation -Telemetry monitoring for arrhythmia -Bedside swallow screen -PT/OT/SLP -Routine EEG -UDS  Addendum: Also check ammonia level.  A. fib with  RVR: Found to be in A. fib with rate up to 150s upon arrival to the ED.  Converted to sinus rhythm after she was given  IV Cardizem 10 mg and a dose of her home amiodarone.  She is not on anticoagulation. -Cardiac monitoring, continue amiodarone  Elevated troponin History of CAD with stents Troponin elevation likely due to demand ischemia in the setting of A. fib with RVR.  High sensitive troponin 80 >210.  Initial EKG showing A. fib with ST changes in inferior and lateral leads likely rate related.  Repeat EKG showing sinus rhythm and no significant change compared to prior tracing from April 2020.  ACS less likely as patient is not complaining of chest pain. -Cardiac monitoring, trend troponin.  Continue home aspirin 81 mg daily.  Update: Repeat troponin 681.  Discussed with on-call cardiologist Dr. Mayer Masker who feels that the troponin elevation is likely due to demand ischemia from A. fib with elevated rate at the time of ED presentation in the setting of end-stage renal disease.  ACS less likely as patient is not complaining of chest pain.  Recommending obtaining an echocardiogram.  He will see the patient in consultation, appreciate recommendations.  Update: High sensitivity troponin uptrending 80 >210 > 681 > 1127.  Concern for possible NSTEMI.  Heparin has been started for anticoagulation after discussion with both cardiology and neurology given patient has a history of ruptured cerebral aneurysm in 2013 status post coiling and VP shunt.  Repeat EKG ordered. Dr. Mayer Masker states daytime cardiology team will decide whether the patient needs to be taken to the Cath Lab.  Will keep her n.p.o. until she is evaluated by cardiology team in the morning.   ESRD on HD TTS: Last HD session was today.  No signs of volume overload.  Potassium and bicarb normal.  No indication for urgent HD at this time. -Consult nephrology during daytime for routine dialysis.  Resume home renal supplements after pharmacy med  rec is done.  Hypertension: Systolic currently in the 140s.  Neurology recommending keeping SBP <180. -IV hydralazine PRN SBP >180  Ruptured cerebral aneurysm status post coiling and shunt: Stable findings on CT. -No indication for acute intervention  Seizure disorder -Resume Keppra after pharmacy med rec is done.  Diet-controlled type 2 diabetes -Check R7E  Chronic diastolic CHF: No signs of volume overload at this time. -Daily weights, intake and output, continue routine dialysis  Thyroid nodule:  CT showing enlarged heterogenous thyroid gland with dominant nodule on the left measuring at least 3 cm in diameter. -Thyroid function tests and ultrasound ordered for further evaluation  DVT prophylaxis: Subcutaneous heparin Code Status: Full code Family Communication: No family member at this time. Disposition Plan: Status is: Observation  The patient remains OBS appropriate and will d/c before 2 midnights.  Dispo: The patient is from: Home              Anticipated d/c is to: Home              Anticipated d/c date is: 2 days              Patient currently is not medically stable to d/c.  The medical decision making on this patient was of high complexity and the patient is at high risk for clinical deterioration, therefore this is a level 3 visit.  Shela Leff MD [facility]  If 7PM-7AM, please contact night-coverage [email or web address]  10/06/2020, 10:53 PM

## 2020-10-06 NOTE — Progress Notes (Signed)
Pt has aneurysm clips, unable to get MRI.

## 2020-10-06 NOTE — Consult Note (Addendum)
Neurology Consult H&P  CC: altered mental status  History from: chart, nursing patient  HPI: [name] is a 59 y.o. female PMHx reviewed below arrived at 1712 from dialysis due to becoming less responsive and not following commands or speaking after her treatment. On arrival to ED EDP activated code stroke. On arrival began to speak and was able to state her name with clear speak, moving all extremities equally.  Denied pain however was very upset and preferred not to speak much.  She states that she has had this happen to her before.  LKW: not clear tpa given?: No, resolved IR Thrombectomy? No, lvo Modified Rankin Scale: 0-Completely asymptomatic and back to baseline post- stroke NIHSS: 0  ROS: A complete ROS was performed and is negative except as noted in the HPI.   Past Medical History:  Diagnosis Date  . Anterior cerebral artery aneurysm    1996 AT [facility]  . Coronary atherosclerosis of native coronary artery    STEMI Feb 2009 - 70% distal LAD lesion c/w plaque rupture, DES distal RCA  . Diastolic heart failure    LVEF 60-65% with grade 2 diastolic dysfunction - July 2014  . End-stage renal disease on hemodialysis ([place]) 02/03/2019  . ESRD (end stage renal disease) on dialysis ([place])   . HTN (hypertension)    Resistant  . Hypercholesterolemia   . Subarachnoid hemorrhage ([place])    03/2012  . Tobacco abuse    Resumed smoking half a pack a day. She was a smoker in the past and states she was abl eto stop in the past using nicotine patches  . Unspecified atrial fibrillation ([place]) 02/03/2019     Family History  Problem Relation Age of Onset  . Heart disease Mother   . Heart disease Father   . Coronary artery disease Other        Premature in 1st degree relatives    Social History:  reports that she quit smoking about 3 years ago. Her smoking use included cigarettes. She has a 7.00 pack-year smoking history. She has never used smokeless tobacco. She reports that she does not  drink alcohol and does not use drugs.   Prior to Admission medications   Medication Sig Start Date End Date Taking? Authorizing Provider  amiodarone (PACERONE) 200 MG tablet Take 2 tabs (400mg ) daily for 7 days, then 1 tab (200mg ) daily thereafter 03/24/18   Rai, Vernelle Emerald, MD  aspirin 81 MG EC tablet Take by mouth. 12/31/17   [provider]  calcitRIOL (ROCALTROL) 0.5 MCG capsule Take 1 capsule (0.5 mcg total) by mouth Every Tuesday,Thursday,and Saturday with dialysis. 02/05/19   Hongalgi, Lenis Dickinson, MD  calcium acetate (PHOSLO) 667 MG capsule Take 667 mg by mouth 3 (three) times daily. 11/28/19   [provider]  cinacalcet (SENSIPAR) 30 MG tablet Take 2 tablets (60 mg total) by mouth Every Tuesday,Thursday,and Saturday with dialysis. 02/05/19   Hongalgi, Lenis Dickinson, MD  iron sucrose in sodium chloride 0.9 % 100 mL Iron Sucrose (Venofer) 11/24/19 11/22/20  [provider]  lactulose (CHRONULAC) 10 GM/15ML solution Take 10 g by mouth daily as needed for moderate constipation.     [provider]  levETIRAcetam (KEPPRA) 500 MG tablet Take 500 mg by mouth 2 (two) times daily.    [provider]  multivitamin (RENA-VIT) TABS tablet Take 1 tablet by mouth daily.    [provider]  nitroGLYCERIN (NITROSTAT) 0.4 MG SL tablet Place 1 tablet (0.4 mg total) under  the tongue every 5 (five) minutes as needed for chest pain. 05/18/13   Janece Canterbury, MD  omeprazole (PRILOSEC) 20 MG capsule Take 20 mg by mouth daily.    [provider]  sucroferric oxyhydroxide (VELPHORO) 500 MG chewable tablet Chew 2 tablets (1,000 mg total) by mouth 3 (three) times daily with meals. 02/04/19   Hongalgi, Lenis Dickinson, MD    Exam: Current vital signs: BP (!) 157/74   Pulse 71   Temp 97.6 F (36.4 C) (Oral)   Resp 18   SpO2 100%   Physical Exam  Constitutional: Appears well-developed and well-nourished.  Psych: Affect appropriate to situation Eyes: No scleral  injection HENT: No OP obstrucion Head: Normocephalic.  Cardiovascular: Normal rate and regular rhythm.  Respiratory: Effort normal and breath sounds normal to anterior ascultation GI: Soft.  No distension. There is no tenderness.  Skin: WDI  Neuro: Mental Status: Patient is awake, alert, oriented to person, place but preferred not to speak much. No signs of aphasia or neglect. Cranial Nerves: II: Visual Fields are full. Pupils are equal, round, and reactive to light. III,IV, VI: EOMI without ptosis or diploplia.  V: Facial sensation is symmetric to temperature VII: Facial movement is symmetric.  VIII: hearing is intact to voice X: Uvula elevates symmetrically XI: Shoulder shrug is symmetric. XII: tongue is midline without atrophy or fasciculations.  Motor: Tone is normal. Bulk is normal. Good strength was present in all four extremities. Sensory: Sensation is symmetric to light touch and temperature in the arms and legs. Deep Tendon Reflexes: 2+ and symmetric in the biceps and patellae. Plantars: Toes are downgoing bilaterally. Cerebellar: preferred not to perform.  I have reviewed labs in epic and the pertinent results are: Results for [name] (MRN [id number]) as of 10/06/2020 19:37  Ref. Range 10/06/2020 17:28  Sodium Latest Ref Range: 135 - 145 mmol/L 141  Potassium Latest Ref Range: 3.5 - 5.1 mmol/L 4.0  Chloride Latest Ref Range: 98 - 111 mmol/L 90 (L)  CO2 Latest Ref Range: 22 - 32 mmol/L 25  Glucose Latest Ref Range: 70 - 99 mg/dL 186 (H)  BUN Latest Ref Range: 6 - 20 mg/dL 20  Creatinine Latest Ref Range: 0.44 - 1.00 mg/dL 4.75 (H)  Calcium Latest Ref Range: 8.9 - 10.3 mg/dL 9.1  Anion gap Latest Ref Range: 5 - 15  26 (H)    I have reviewed the images obtained: NCT head showed No acute finding by CT. Extensive chronic small-vessel ischemic changes throughout the brain as seen previously. 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 Right VP shunt in  place. No hydrocephalus.   Assessment: [name] is a 59 y.o. female PMHx as above HTN, chronic stroke, ruptured aneurysm s/p coiling and shunt, DM2 on HD with transient acute encephalopathy after dialysis. Though she has had similar episodes after dialysis in the past, she has significant vascular risk factors, previous stroke. The patient stated that she has had seizures in the past and though frequent actual frequency is not clear. DDx includes dialysis disequilibrium syndrome, seizure, TIA.   Plan: - MRI brain without contrast in not contraindicated. - Recommend vascular imaging with MRA head and neck or CTA head neck. - Recommend TTE. - Recommend labs: HbA1c, lipid panel, TSH. - Recommend Statin if LDL > 70 - Continue aspirin 81mg  daily. - SBP <180 for now. - Telemetry monitoring for arrhythmia. - Recommend bedside Swallow screen. - Recommend Stroke education. - Recommend PT/OT/SLP consult. - Routine EEG to  eval for any epileptogenic discharges - Ordered. - Consider metabolic/infectious workup.   This patient is critically ill and at significant risk of neurological worsening, death and care requires constant monitoring of vital signs, hemodynamics,respiratory and cardiac monitoring, neurological assessment, discussion with family, other specialists and medical decision making of high complexity. I spent 73 minutes of neurocritical care time  in the care of  this patient. This was time spent independent of any time provided by nurse practitioner or PA.  Electronically signed by: Dr. Lynnae Sandhoff Pager: [phone] 10/06/2020, 7:31 PM

## 2020-10-06 NOTE — ED Notes (Addendum)
Code stroke canceled by neurologist, neurologist is at bed side speaking to patient.

## 2020-10-06 NOTE — ED Notes (Addendum)
Pt transported to imaging. Vital assessment will be taken when pt returns from imaging.

## 2020-10-07 ENCOUNTER — Observation Stay (HOSPITAL_COMMUNITY): Payer: 59

## 2020-10-07 ENCOUNTER — Inpatient Hospital Stay (HOSPITAL_COMMUNITY): Payer: 59

## 2020-10-07 ENCOUNTER — Other Ambulatory Visit (HOSPITAL_COMMUNITY): Payer: 59

## 2020-10-07 DIAGNOSIS — I10 Essential (primary) hypertension: Secondary | ICD-10-CM | POA: Diagnosis not present

## 2020-10-07 DIAGNOSIS — G9349 Other encephalopathy: Secondary | ICD-10-CM | POA: Diagnosis present

## 2020-10-07 DIAGNOSIS — Z992 Dependence on renal dialysis: Secondary | ICD-10-CM | POA: Diagnosis not present

## 2020-10-07 DIAGNOSIS — Z681 Body mass index (BMI) 19 or less, adult: Secondary | ICD-10-CM | POA: Diagnosis not present

## 2020-10-07 DIAGNOSIS — R778 Other specified abnormalities of plasma proteins: Secondary | ICD-10-CM | POA: Diagnosis not present

## 2020-10-07 DIAGNOSIS — T82898A Other specified complication of vascular prosthetic devices, implants and grafts, initial encounter: Secondary | ICD-10-CM | POA: Diagnosis present

## 2020-10-07 DIAGNOSIS — I4891 Unspecified atrial fibrillation: Secondary | ICD-10-CM

## 2020-10-07 DIAGNOSIS — I775 Necrosis of artery: Secondary | ICD-10-CM | POA: Diagnosis present

## 2020-10-07 DIAGNOSIS — R636 Underweight: Secondary | ICD-10-CM | POA: Diagnosis present

## 2020-10-07 DIAGNOSIS — I96 Gangrene, not elsewhere classified: Secondary | ICD-10-CM | POA: Diagnosis present

## 2020-10-07 DIAGNOSIS — I480 Paroxysmal atrial fibrillation: Secondary | ICD-10-CM | POA: Diagnosis present

## 2020-10-07 DIAGNOSIS — E785 Hyperlipidemia, unspecified: Secondary | ICD-10-CM

## 2020-10-07 DIAGNOSIS — R4182 Altered mental status, unspecified: Secondary | ICD-10-CM

## 2020-10-07 DIAGNOSIS — T82510A Breakdown (mechanical) of surgically created arteriovenous fistula, initial encounter: Secondary | ICD-10-CM | POA: Diagnosis present

## 2020-10-07 DIAGNOSIS — I361 Nonrheumatic tricuspid (valve) insufficiency: Secondary | ICD-10-CM

## 2020-10-07 DIAGNOSIS — R64 Cachexia: Secondary | ICD-10-CM | POA: Diagnosis present

## 2020-10-07 DIAGNOSIS — G9341 Metabolic encephalopathy: Secondary | ICD-10-CM | POA: Diagnosis not present

## 2020-10-07 DIAGNOSIS — I2510 Atherosclerotic heart disease of native coronary artery without angina pectoris: Secondary | ICD-10-CM | POA: Diagnosis present

## 2020-10-07 DIAGNOSIS — I21A1 Myocardial infarction type 2: Secondary | ICD-10-CM | POA: Diagnosis not present

## 2020-10-07 DIAGNOSIS — Z955 Presence of coronary angioplasty implant and graft: Secondary | ICD-10-CM | POA: Diagnosis not present

## 2020-10-07 DIAGNOSIS — R9431 Abnormal electrocardiogram [ECG] [EKG]: Secondary | ICD-10-CM | POA: Diagnosis present

## 2020-10-07 DIAGNOSIS — E782 Mixed hyperlipidemia: Secondary | ICD-10-CM | POA: Diagnosis present

## 2020-10-07 DIAGNOSIS — Y712 Prosthetic and other implants, materials and accessory cardiovascular devices associated with adverse incidents: Secondary | ICD-10-CM | POA: Diagnosis present

## 2020-10-07 DIAGNOSIS — I5032 Chronic diastolic (congestive) heart failure: Secondary | ICD-10-CM | POA: Diagnosis present

## 2020-10-07 DIAGNOSIS — E1122 Type 2 diabetes mellitus with diabetic chronic kidney disease: Secondary | ICD-10-CM | POA: Diagnosis present

## 2020-10-07 DIAGNOSIS — E46 Unspecified protein-calorie malnutrition: Secondary | ICD-10-CM | POA: Diagnosis not present

## 2020-10-07 DIAGNOSIS — Z8679 Personal history of other diseases of the circulatory system: Secondary | ICD-10-CM | POA: Diagnosis not present

## 2020-10-07 DIAGNOSIS — Y838 Other surgical procedures as the cause of abnormal reaction of the patient, or of later complication, without mention of misadventure at the time of the procedure: Secondary | ICD-10-CM | POA: Diagnosis present

## 2020-10-07 DIAGNOSIS — E878 Other disorders of electrolyte and fluid balance, not elsewhere classified: Secondary | ICD-10-CM | POA: Diagnosis present

## 2020-10-07 DIAGNOSIS — N2581 Secondary hyperparathyroidism of renal origin: Secondary | ICD-10-CM | POA: Diagnosis present

## 2020-10-07 DIAGNOSIS — N186 End stage renal disease: Secondary | ICD-10-CM | POA: Diagnosis present

## 2020-10-07 DIAGNOSIS — Z20822 Contact with and (suspected) exposure to covid-19: Secondary | ICD-10-CM | POA: Diagnosis present

## 2020-10-07 DIAGNOSIS — I132 Hypertensive heart and chronic kidney disease with heart failure and with stage 5 chronic kidney disease, or end stage renal disease: Secondary | ICD-10-CM | POA: Diagnosis present

## 2020-10-07 DIAGNOSIS — R7989 Other specified abnormal findings of blood chemistry: Secondary | ICD-10-CM | POA: Diagnosis not present

## 2020-10-07 DIAGNOSIS — G40909 Epilepsy, unspecified, not intractable, without status epilepticus: Secondary | ICD-10-CM | POA: Diagnosis present

## 2020-10-07 DIAGNOSIS — I214 Non-ST elevation (NSTEMI) myocardial infarction: Secondary | ICD-10-CM | POA: Diagnosis not present

## 2020-10-07 LAB — ECHOCARDIOGRAM COMPLETE
AR max vel: 2.4 cm2
AV Area VTI: 2.56 cm2
AV Area mean vel: 2.39 cm2
AV Mean grad: 3.4 mmHg
AV Peak grad: 7.7 mmHg
Ao pk vel: 1.38 m/s
Area-P 1/2: 4.31 cm2
Calc EF: 70.7 %
S' Lateral: 2.2 cm
Single Plane A2C EF: 77.9 %
Single Plane A4C EF: 64.5 %

## 2020-10-07 LAB — LIPID PANEL
Cholesterol: 219 mg/dL — ABNORMAL HIGH (ref 0–200)
HDL: 59 mg/dL (ref >40)
LDL Cholesterol: 144 mg/dL — ABNORMAL HIGH (ref 0–99)
Total CHOL/HDL Ratio: 3.7 RATIO
Triglycerides: 80 mg/dL (ref <150)
VLDL: 16 mg/dL (ref 0–40)

## 2020-10-07 LAB — TROPONIN I (HIGH SENSITIVITY)
Troponin I (High Sensitivity): 1127 ng/L (ref <18)
Troponin I (High Sensitivity): 1480 ng/L (ref <18)
Troponin I (High Sensitivity): 681 ng/L (ref <18)

## 2020-10-07 LAB — T4, FREE: Free T4: 0.91 ng/dL (ref 0.61–1.12)

## 2020-10-07 LAB — HEPARIN LEVEL (UNFRACTIONATED): Heparin Unfractionated: 0.62 IU/mL (ref 0.30–0.70)

## 2020-10-07 LAB — TSH: TSH: 0.539 u[IU]/mL (ref 0.350–4.500)

## 2020-10-07 LAB — GLUCOSE, CAPILLARY
Glucose-Capillary: 68 mg/dL — ABNORMAL LOW (ref 70–99)
Glucose-Capillary: 92 mg/dL (ref 70–99)

## 2020-10-07 LAB — HEMOGLOBIN A1C
Hgb A1c MFr Bld: 5.2 % (ref 4.8–5.6)
Mean Plasma Glucose: 102.54 mg/dL

## 2020-10-07 LAB — HIV ANTIBODY (ROUTINE TESTING W REFLEX): HIV Screen 4th Generation wRfx: NONREACTIVE

## 2020-10-07 LAB — AMMONIA: Ammonia: 18 umol/L (ref 9–35)

## 2020-10-07 MED ORDER — HEPARIN (PORCINE) 25000 UT/250ML-% IV SOLN
600.0000 [IU]/h | INTRAVENOUS | Status: DC
  Administered 2020-10-07: 08:00:00 700 [IU]/h via INTRAVENOUS
  Filled 2020-10-07 (×2): qty 250

## 2020-10-07 MED ORDER — ROSUVASTATIN CALCIUM 5 MG PO TABS
10.0000 mg | ORAL_TABLET | Freq: Every day | ORAL | Status: DC
  Administered 2020-10-08 – 2020-10-17 (×10): 10 mg via ORAL
  Filled 2020-10-07 (×10): qty 2

## 2020-10-07 MED ORDER — METOPROLOL TARTRATE 25 MG PO TABS
25.0000 mg | ORAL_TABLET | Freq: Two times a day (BID) | ORAL | Status: DC
  Administered 2020-10-07 – 2020-10-17 (×19): 25 mg via ORAL
  Filled 2020-10-07 (×19): qty 1

## 2020-10-07 NOTE — Progress Notes (Signed)
Patient with markedly elevated troponin, now up to 1127 from 80 at initial check. Per Dr. Marlowe Sax, most c/w NSTEMI, requiring anticoagulation. Has a history of basilar tip aneurysm rupture s/p coiling, as well as prior aneurysm clippings in the anterior circulation, which would increase the risk of ICH in the context of anticoagulation. However, from an overall cardiac morbidity/mortality standpoint the benefit of starting anticoagulation for treatment of NSTEMI significantly outweighs the neurological risk of morbidity/mortality from possible recurrent bleed from the previously coiled and clipped aneurysms. Discussed with Dr. Marlowe Sax.   Electronically signed: Dr. Kerney Elbe

## 2020-10-07 NOTE — Progress Notes (Addendum)
Progress Note  Patient Name: Cataleah Stites Date of Encounter: 10/07/2020  [facility] Cardiologist: Shelva Majestic, MD   Subjective   Patient awake but still confused. Asking to eat. Unable to tell me the year, but knows she is in the hospital. Denies chest pain or SOB.   Converted back to NSR with dilt. Now on amiodarone 200mg  daily Trop 80>681>1127>1480 Was started on heparin overnight as cleared by Neuro  Inpatient Medications    Scheduled Meds: .  stroke: mapping our early stages of recovery book   Does not apply Once  . amiodarone  200 mg Oral Daily  . aspirin EC  81 mg Oral Daily  . metoprolol tartrate  25 mg Oral BID  . rosuvastatin  10 mg Oral Daily   Continuous Infusions: . heparin 700 Units/hr (10/07/20 0744)   PRN Meds: acetaminophen **OR** acetaminophen (TYLENOL) oral liquid 160 mg/5 mL **OR** acetaminophen, hydrALAZINE   Vital Signs    Vitals:   10/07/20 1000 10/07/20 1015 10/07/20 1115 10/07/20 1130  BP: (!) 156/66     Pulse: 67 64 67 69  Resp: (!) 21 15 (!) 21 14  Temp:      TempSrc:      SpO2: 100% 98% 100% 96%   No intake or output data in the 24 hours ending 10/07/20 1142 Last 3 Weights 12/06/2019 02/03/2019 02/03/2019  Weight (lbs) 103 lb 4.8 oz 99 lb 13.9 oz 100 lb 1.4 oz  Weight (kg) 46.857 kg 45.3 kg 45.4 kg  Some encounter information is confidential and restricted. Go to Review Flowsheets activity to see all data.      Telemetry    NSR - Personally Reviewed  ECG    NSR with LVH- Personally Reviewed  Physical Exam   GEN: Thin female. Comfortable but confused. Neck: No JVD Cardiac: RRR, 2/6 systolic murmur. No rubs, or gallops.  Respiratory: Clear to auscultation bilaterally. GI: Soft, nontender, non-distended  MS: No edema; No deformity. Neuro:  Confused. Able to say she is in hospital but does not know year or president Psych: Normal affect   Labs    High Sensitivity Troponin:   Recent Labs  Lab 10/06/20 1728  10/06/20 1947 10/06/20 2337 10/07/20 0205 10/07/20 0826  TROPONINIHS 80* 210* 681* 1,127* 1,480*      Chemistry Recent Labs  Lab 10/06/20 1726 10/06/20 1728  NA 137 141  K 4.5 4.0  CL 95* 90*  CO2  --  25  GLUCOSE 176* 186*  BUN 29* 20  CREATININE 4.60* 4.75*  CALCIUM  --  9.1  PROT  --  8.3*  ALBUMIN  --  4.0  AST  --  18  ALT  --  12  ALKPHOS  --  61  BILITOT  --  0.3  GFRNONAA  --  10*  ANIONGAP  --  26*     Hematology Recent Labs  Lab 10/06/20 1726 10/06/20 1728  WBC  --  8.2  RBC  --  5.04  HGB 16.3* 14.7  HCT 48.0* 46.8*  MCV  --  92.9  MCH  --  29.2  MCHC  --  31.4  RDW  --  15.0  PLT  --  170    BNPNo results for input(s): BNP, PROBNP in the last 168 hours.   DDimer No results for input(s): DDIMER in the last 168 hours.   Radiology    CT Angio Head W/Cm &/Or Wo Cm  Result Date: 10/06/2020 CLINICAL DATA:  Weakness.  Acute stroke presentation. EXAM: CT ANGIOGRAPHY HEAD AND NECK TECHNIQUE: Multidetector CT imaging of the head and neck was performed using the standard protocol during bolus administration of intravenous contrast. Multiplanar CT image reconstructions and MIPs were obtained to evaluate the vascular anatomy. Carotid stenosis measurements (when applicable) are obtained utilizing NASCET criteria, using the distal internal carotid diameter as the denominator. CONTRAST:  39mL OMNIPAQUE IOHEXOL 350 MG/ML SOLN COMPARISON:  Head CT same day. FINDINGS: CTA NECK FINDINGS Aortic arch: Aortic atherosclerosis. No flow limiting stenosis of the brachiocephalic vessel origins. Right carotid system: Common carotid artery shows atherosclerotic calcification but is patent to the bifurcation. Calcified plaque at the carotid bifurcation and ICA bulb. Minimal diameter of the distal bulb is 4 mm. Compared to a more distal cervical ICA diameter of 4 mm, there is no stenosis. Left carotid system: Common carotid artery shows scattered atherosclerotic plaque but is  patent to the bifurcation. Calcified plaque at the carotid bifurcation and ICA bulb. Minimal diameter in the ICA bulb measures 2 mm. Compared to a more distal cervical ICA diameter of 4 mm, this indicates a 50% stenosis. Vertebral arteries: Calcified plaque at both vertebral artery origins. Estimated 50% stenosis at both vertebral artery origins. Other areas of calcified plaque within both vertebral arteries present additional stenoses, more severe on the right than the left. Both vessels do remain patent through the cervical region to the foramen magnum. Skeleton: Ordinary cervical spondylosis. Other neck: Enlarged heterogeneous thyroid gland. Dominant nodule inferior left measuring at least 3 cm in diameter. Upper chest: No active disease. Review of the MIP images confirms the above findings CTA HEAD FINDINGS Anterior circulation: Both internal carotid arteries are patent through the skull base and siphon regions. Extensive siphon atherosclerotic calcification with stenosis estimated at 50% on both sides. Aneurysm clip adjacent to the supraclinoid ICA on the right. No acute large or medium vessel occlusion identified. Second aneurysm clip at the right MCA bifurcation region. Posterior circulation: Both vertebral arteries are patent through the foramen magnum. 50% stenosis of the right vertebral artery V4 segment. Both vertebral arteries reach the basilar. Basilar artery shows some atherosclerotic narrowing and irregularity without a severe stenosis. 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 No sign of recurrent flow or coil compaction. Venous sinuses: Patent and normal. Anatomic variants: None other significant. Review of the MIP images confirms the above findings IMPRESSION: 1. No acute large or medium vessel occlusion. 2. Aortic atherosclerosis. 3. Atherosclerotic disease at both carotid bifurcations. No measurable stenosis on the right. 50% stenosis of the ICA bulb on the left. 4. 50% stenosis at both  vertebral artery origins. Additional stenoses along the cervical course of both vertebral arteries. 50% stenosis of the right vertebral artery V4 segment. 5. 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 No sign of recurrent flow or coil compaction. 6. Aneurysm clips at the right MCA bifurcation and supraclinoid ICA region. No sign of residual aneurysmal flow. 7. 50% stenosis of both carotid siphon regions. 8. Enlarged heterogeneous thyroid gland with dominant nodule on the left measuring at least 3 cm in diameter. Recommend thyroid US (ref: J Am Coll Radiol. 2015 Feb;12(2): 143-50). Aortic Atherosclerosis (ICD10-I70.0). Electronically Signed   By: Nelson Chimes M.D.   On: 10/06/2020 22:03   DG Chest 2 View  Result Date: 10/06/2020 CLINICAL DATA:  Altered level of consciousness EXAM: CHEST - 2 VIEW COMPARISON:  02/02/2019 FINDINGS: Frontal and lateral views of the chest demonstrates stable ventriculostomy catheter within the right  anterior chest. Cardiac silhouette is stable. No acute airspace disease, effusion, or pneumothorax. No acute bony abnormalities. IMPRESSION: 1. Stable chest, no acute process. Electronically Signed   By: Randa Ngo M.D.   On: 10/06/2020 23:30   CT Angio Neck W and/or Wo Contrast  Result Date: 10/06/2020 CLINICAL DATA:  Weakness.  Acute stroke presentation. EXAM: CT ANGIOGRAPHY HEAD AND NECK TECHNIQUE: Multidetector CT imaging of the head and neck was performed using the standard protocol during bolus administration of intravenous contrast. Multiplanar CT image reconstructions and MIPs were obtained to evaluate the vascular anatomy. Carotid stenosis measurements (when applicable) are obtained utilizing NASCET criteria, using the distal internal carotid diameter as the denominator. CONTRAST:  18mL OMNIPAQUE IOHEXOL 350 MG/ML SOLN COMPARISON:  Head CT same day. FINDINGS: CTA NECK FINDINGS Aortic arch: Aortic atherosclerosis. No flow limiting stenosis of the brachiocephalic  vessel origins. Right carotid system: Common carotid artery shows atherosclerotic calcification but is patent to the bifurcation. Calcified plaque at the carotid bifurcation and ICA bulb. Minimal diameter of the distal bulb is 4 mm. Compared to a more distal cervical ICA diameter of 4 mm, there is no stenosis. Left carotid system: Common carotid artery shows scattered atherosclerotic plaque but is patent to the bifurcation. Calcified plaque at the carotid bifurcation and ICA bulb. Minimal diameter in the ICA bulb measures 2 mm. Compared to a more distal cervical ICA diameter of 4 mm, this indicates a 50% stenosis. Vertebral arteries: Calcified plaque at both vertebral artery origins. Estimated 50% stenosis at both vertebral artery origins. Other areas of calcified plaque within both vertebral arteries present additional stenoses, more severe on the right than the left. Both vessels do remain patent through the cervical region to the foramen magnum. Skeleton: Ordinary cervical spondylosis. Other neck: Enlarged heterogeneous thyroid gland. Dominant nodule inferior left measuring at least 3 cm in diameter. Upper chest: No active disease. Review of the MIP images confirms the above findings CTA HEAD FINDINGS Anterior circulation: Both internal carotid arteries are patent through the skull base and siphon regions. Extensive siphon atherosclerotic calcification with stenosis estimated at 50% on both sides. Aneurysm clip adjacent to the supraclinoid ICA on the right. No acute large or medium vessel occlusion identified. Second aneurysm clip at the right MCA bifurcation region. Posterior circulation: Both vertebral arteries are patent through the foramen magnum. 50% stenosis of the right vertebral artery V4 segment. Both vertebral arteries reach the basilar. Basilar artery shows some atherosclerotic narrowing and irregularity without a severe stenosis. 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 No sign of recurrent  flow or coil compaction. Venous sinuses: Patent and normal. Anatomic variants: None other significant. Review of the MIP images confirms the above findings IMPRESSION: 1. No acute large or medium vessel occlusion. 2. Aortic atherosclerosis. 3. Atherosclerotic disease at both carotid bifurcations. No measurable stenosis on the right. 50% stenosis of the ICA bulb on the left. 4. 50% stenosis at both vertebral artery origins. Additional stenoses along the cervical course of both vertebral arteries. 50% stenosis of the right vertebral artery V4 segment. 5. 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 No sign of recurrent flow or coil compaction. 6. Aneurysm clips at the right MCA bifurcation and supraclinoid ICA region. No sign of residual aneurysmal flow. 7. 50% stenosis of both carotid siphon regions. 8. Enlarged heterogeneous thyroid gland with dominant nodule on the left measuring at least 3 cm in diameter. Recommend thyroid US (ref: J Am Coll Radiol. 2015 Feb;12(2): 143-50). Aortic  Atherosclerosis (ICD10-I70.0). Electronically Signed   By: Nelson Chimes M.D.   On: 10/06/2020 22:03   EEG adult  Result Date: 10/07/2020 Lora Havens, MD     10/07/2020 11:06 AM Patient Name: Emina Ribaudo MRN: [id number] Epilepsy Attending: Lora Havens Referring Physician/Provider: Dr Derrick Ravel Date: 10/07/2020 Duration: 28.56 mins Patient history: 59yo M with ams. EEG to evaluate for seizure Level of alertness: Awake,  asleep AEDs during EEG study: None Technical aspects: This EEG study was done with scalp electrodes positioned according to the 10-20 International system of electrode placement. Electrical activity was acquired at a sampling rate of 500Hz  and reviewed with a high frequency filter of 70Hz  and a low frequency filter of 1Hz . EEG data were recorded continuously and digitally stored. Description: The posterior dominant rhythm consists of 9 Hz activity of moderate voltage (25-35 uV) seen  predominantly in posterior head regions, symmetric and reactive to eye opening and eye closing.  Sleep was characterized by vertex waves, sleep spindles (12 to 14 Hz), maximal frontocentral region. Hyperventilation and photic stimulation were not performed.   IMPRESSION: This study is within normal limits. No seizures or epileptiform discharges were seen throughout the recording. Lora Havens   CT HEAD CODE STROKE WO CONTRAST  Result Date: 10/06/2020 CLINICAL DATA:  Code stroke.  Weakness EXAM: CT HEAD WITHOUT CONTRAST TECHNIQUE: Contiguous axial images were obtained from the base of the skull through the vertex without intravenous contrast. COMPARISON:  02/16/2018 FINDINGS: Brain: Old small vessel cerebellar infarctions as seen previously. Cerebral hemispheres show old infarction in the left parietal lobe which is unchanged. Extensive chronic small-vessel ischemic changes of the white matter as seen previously. Old infarction in the left basal ganglia. Right VP shunt placed from a frontal approach enters the right lateral ventricle, extends through the foramen of Monro into the third ventricle and has its tip in the region of the left midbrain. 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 Previous aneurysm clip at the base of the brain on the right. No sign of acute infarction, hemorrhage, hydrocephalus or extra-axial collection. Vascular: There is atherosclerotic calcification of the major vessels at the base of the brain. Skull: Craniotomy on the right in the distant past. No acute finding. Sinuses/Orbits: Clear/normal Other: None IMPRESSION: 1. No acute finding by CT. Extensive chronic small-vessel ischemic changes throughout the brain as seen previously. 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 2. Right VP shunt in place. No hydrocephalus. 3. These results were communicated to [facility] at 5:43 pmon 12/18/2021by text page via the [facility] messaging system. Electronically Signed   By: Nelson Chimes M.D.   On: 10/06/2020 17:44    Cardiac Studies   TTE 2019: Study Conclusions   - Left ventricle: The cavity size was normal. There was moderate  concentric hypertrophy. Systolic function was normal. The  estimated ejection fraction was in the range of 60% to 65%. Wall  motion was normal; there were no regional wall motion  abnormalities. There was an increased relative contribution of  atrial contraction to ventricular filling. Doppler parameters are  consistent with abnormal left ventricular relaxation (grade 1  diastolic dysfunction).  - Aortic valve: Moderate focal calcification involving the left  coronary cusp.  - Pulmonary arteries: Systolic pressure could not be accurately  estimated.  - Pericardium, extracardiac: A small, free-flowing pericardial  effusion was identified circumferential to the heart. The fluid  had no internal echoes.There was no evidence of hemodynamic  compromise.   Carotids 2015:  Summary:   - Mild technical difficulty on the right distal ICA due to  tortuosity  - Bilateral - 1% to 39% ICA stenosis. Vertebral artery flow  is antegrade.  Other specific details can be found in the table(s) above.   Prepared and Electronically Authenticated by   TEE 2015: Study Conclusions   - Left ventricle: The cavity size was normal. Wall thickness  was normal. Systolic function was normal. The estimated  ejection fraction was in the range of 60% to 65%.  - Aortic valve: No evidence of vegetation.  - Mitral valve: No evidence of vegetation.  - Left atrium: No evidence of thrombus in the atrial cavity  or appendage. No evidence of thrombus in the appendage.  - Right atrium: No evidence of thrombus in the atrial cavity  or appendage. No evidence of thrombus in the atrial cavity  or appendage.  - Atrial septum: No defect or patent foramen ovale was  identified. Echo contrast study showed no right-to-left   atrial level shunt, following an increase in RA pressure  induced by provocative maneuvers. Echo contrast study  showed no right-to-left atrial level shunt, following an  increase in RA pressure induced by provocative maneuvers.  - Tricuspid valve: No evidence of vegetation.  - Pulmonic valve: No evidence of vegetation.  - Superior vena cava: The study excluded a thrombus.  - Pericardium, extracardiac: A trivial pericardial effusion  was identified.   Patient Profile     59 y.o. 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 Multiple admissions for Afib with RVR and NSTEMI. Maintained on amiodarone but compliance/follow-up is an issue. Not a candidate for long term AC due to history of ICH. -Continue amiodarone 200mg  daily -Continue metop 25mg  BID -Continue heparin gtt for 48h for NSTEMI -No long term AC due to history of ICH  #NSTEMI: #CAD s/p PCI to RCA in 2009 Degree of troponin elevation appears out of proportion to Afib with RVR and continues to rise. Has known CAD with history of PCI in the past with residual 70% dLAD & 50% dCFX. Patient is not an ideal candidate for cath given history of ICH, risk of DAPT, and poor compliance. She is fortunately chest pain free. Initial review of TTE shows preserved LV function. Favor medical management for now unless significant clinical change. -Recommend with medical management as patient chest pain free and very high risk for DAPT given history  of ICH in the past unless signficant clinical change -Follow-up TTE; EF preserved with no WMA on my prelim review -Continue heparin gtt for 48hours for medical management of CAD -No ASA given ICH and heparin currently -Continue metop 25mg  BID -Continue rosuvastatin  #ESRD on HD: -Continue HD  #HTN: -Continue home meds  #HLD: -Statin as above  #Encephalopathy: -Improved with HD -Management per primary team      For questions or updates, please contact [facility] Please consult [email or web address] for contact info under        Signed, [employer]  Renae Fickle, MD  10/07/2020, 11:42 AM

## 2020-10-07 NOTE — Progress Notes (Signed)
OT Cancellation Note  Patient Details Name: [name] MRN: [id number] DOB: 13-Nov-1960   Cancelled Treatment:    Reason Eval/Treat Not Completed: Medical issues which prohibited therapy; Pt with recent up-trending troponins and concern for NSTEMI per chart. Will hold and follow up for OT eval as able/as pt is medically appropriate.   [name], OT Acute Rehabilitation Services Pager [phone] Office [phone]   [name] 10/07/2020, 10:58 AM

## 2020-10-07 NOTE — Progress Notes (Signed)
  Echocardiogram 2D Echocardiogram has been performed.  Bobbye Charleston 10/07/2020, 11:07 AM

## 2020-10-07 NOTE — Progress Notes (Signed)
ANTICOAGULATION CONSULT NOTE - Initial Consult  Pharmacy Consult for Heparin  Indication: chest pain/ACS, also has hx afib  Allergies  Allergen Reactions  . Ampicillin Hives and Rash    Has patient had a PCN reaction causing immediate rash, facial/tongue/throat swelling, SOB or lightheadedness with hypotension: No Has patient had a PCN reaction causing severe rash involving mucus membranes or skin necrosis: No Has patient had a PCN reaction that required hospitalization: No Has patient had a PCN reaction occurring within the last 10 years: No If all of the above answers are "NO", then may proceed with Cephalosporin use.   Marland Kitchen Penicillins Hives and Rash    Has patient had a PCN reaction causing immediate rash, facial/tongue/throat swelling, SOB or lightheadedness with hypotension: Yes Has patient had a PCN reaction causing severe rash involving mucus membranes or skin necrosis: No Has patient had a PCN reaction that required hospitalization: No Has patient had a PCN reaction occurring within the last 10 years: No If all of the above answers are "NO", then may proceed with Cephalosporin use.      Vital Signs: Temp: 98.5 F (36.9 C) (12/19 1726) Temp Source: Oral (12/19 1726) BP: 148/68 (12/19 1726) Pulse Rate: 70 (12/19 1726)  Labs: Recent Labs    10/06/20 1726 10/06/20 1728 10/06/20 1947 10/06/20 2337 10/07/20 0205 10/07/20 0826 10/07/20 1707  HGB 16.3* 14.7  --   --   --   --   --   HCT 48.0* 46.8*  --   --   --   --   --   PLT  --  170  --   --   --   --   --   APTT  --  26  --   --   --   --   --   LABPROT  --  12.8  --   --   --   --   --   INR  --  1.0  --   --   --   --   --   HEPARINUNFRC  --   --   --   --   --   --  0.62  CREATININE 4.60* 4.75*  --   --   --   --   --   TROPONINIHS  --  80*   < > 681* 1,127* 1,480*  --    < > = values in this interval not displayed.    CrCl cannot be calculated (Unknown ideal weight.).   Medical History: Past Medical  History:  Diagnosis Date  . Anterior cerebral artery aneurysm    1996 [facility]  . Coronary atherosclerosis of native coronary artery    STEMI Feb 2009 - 70% distal LAD lesion c/w plaque rupture, DES distal RCA  . Diastolic heart failure    LVEF 60-65% with grade 2 diastolic dysfunction - July 2014  . End-stage renal disease on hemodialysis ([place]) 02/03/2019  . ESRD (end stage renal disease) on dialysis ([place])   . HTN (hypertension)    Resistant  . Hypercholesterolemia   . Subarachnoid hemorrhage ([place])    03/2012  . Tobacco abuse    Resumed smoking half a pack a day. She was a smoker in the past and states she was abl eto stop in the past using nicotine patches  . Unspecified atrial fibrillation ([place]) 02/03/2019    Assessment: 59 y/o M with altered mental status. Troponin is rising [phone]). Also has hx of afib on  no anti-coagulation PTA. Requested to start heparin drip. Dr. Marlowe Sax cleared starting heparin drip with neurology given history of SAH and hx of aneurysm with clips. CBC good. ESRD.   Goal of Therapy:  Heparin level 0.3-0.5 units/ml Monitor platelets by anticoagulation protocol: Yes   Plan:  - Patient's heparin level is supra-therapeutic at 0.66 - Will decrease heparin drip to 600 units/h  - Recheck heparin level in ~ 8r - Monitor for s/s of bleeding and Daily CBC/HL  Duanne Limerick, PharmD, BCPS Clinical Pharmacist Phone: [phone]

## 2020-10-07 NOTE — Progress Notes (Signed)
PT Cancellation Note  Patient Details Name: [name] MRN: [id number] DOB: [birth year]   Cancelled Treatment:    Reason Eval/Treat Not Completed: Medical issues which prohibited therapy. Pt with recent up-trending troponins and concern for NSTEMI per chart. PT will hold at this time until troponin levels have peaked and pt is more appropriate to participate in mobilization.   Zenaida Niece 10/07/2020, 8:05 AM

## 2020-10-07 NOTE — Progress Notes (Signed)
PROGRESS NOTE  [name]  DOB: May 21, 1961  PCP: Garwin Brothers, MD JKD:[id number]  DOA: 10/06/2020  LOS: 0 days   Chief Complaint  Patient presents with  . Code Stroke  . Altered Mental Status  Brief narrative: [name] is a 59 y.o. female with PMH significant for ESRD on HD, T2DM, HLD, CAD with stents, chronic diastolic CHF, A. 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 Patient was brought to the ED on 12/18 from dialysis center for evaluation of decreased responsiveness, not following commands or speaking after her treatment.    On arrival to the ED code stroke was activated and patient was seen by neurology.  Symptoms resolved soon after and code stroke was canceled by neurology.  Initial head CT negative for acute finding.   CTA head and neck was negative for acute large or medium vessel occlusion.  Atherosclerotic disease of both carotid bifurcations.  No measurable stenosis on the right.  50% stenosis of the ICA bulb on the left.  50% stenosis of both vertebral artery origins.  Additional stenosis along the cervical course of both vertebral arteries.  50% stenosis of the right vertebral artery V4 segment.  50% stenosis of both carotid siphon regions.  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  Aneurysm clips at the right MCA bifurcation and supraclinoid ICA region; no sign of residual aneurysmal flow.  In the ED, patient was in A. fib at the rate of 150s, she converted to normal sinus rhythm after a dose of IV Cardizem 10 mg and home dose of amiodarone. Labs unremarkable.  Subjective: Patient was seen and examined this morning. Pleasant middle-aged African-American female.  Looks older for her age.  Thin built. Not in distress.  Able to answer questions.  Getting prepped for EEG.  Wants to eat but understands cardiology evaluation is pending. States  he lives at home with a daughter.  Patient is wheelchair/bedbound at baseline.  Assessment/Plan: Acute encephalopathy -In the setting of dialysis, atherosclerosis of intracranial vessels. -No evidence of infection. Most likely related to be received in fluid electrolyte project dialysis. -Neurology consultation appreciated. Work-up started to rule out CVA. -Head CT negative for acute finding.   -CT angio of head and neck did not show any acute large or medium vessel occlusion. -Neurology consultation appreciated. -Ongoing stroke work-up including TTE, A1c, lipid panel, TSH  -A1c 5.2, LDL 144, ammonia 18, UDS negative. -Currently on aspirin 81 mg daily. -Continue to monitor in telemetry. -PT/OT/ST eval.  -EEG, ammonia level  A. fib with RVR:  -Initially in A. fib with RVR up to 150s. Converted to normal sinus on with IV Cardizem 10 mg and a dose of amiodarone from home.  -Cardiology consultation appreciated. Added metoprolol 25 mg twice daily. Unclear if patient got any of that yet  NSTEMI/elevated troponin History of CAD with stents -Troponin trend as below with steady rise to more than 1000 early this morning. Repeat this morning again. -Has history of subarachnoid hemorrhage from an aneurysm in 2013 status post coiling. I noticed that admitting physician discussed with neurology and got an approval to initiate heparin drip for NSTEMI -Cardiology following. Recent Labs    10/06/20 2337 10/07/20 0205 10/07/20 0826  TROPONINIHS 681* 1,127* 1,480*   ESRD-HD-TTS -Patient was at the dialysis center yesterday but she says she could not complete the dialysis because of her symptoms.  We will involve nephrology  team. -Does not seem to have an need of repeat dialysis at this time.  Chronic diastolic CHF Hypertension -Blood pressure in 140s. -Not on diuretics are any blood pressure medicines at home. -Continue monitor.  IV hydralazine as needed.  Ruptured cerebral aneurysm status  post coiling and shunt: Stable findings on CT. -No indication for acute intervention  Seizure disorder -Resume Keppra   Diet-controlled type 2 diabetes -A1c 5.2 on 2/19. -Currently on sliding scale insulin with Accu-Cheks. Recent Labs  Lab 10/06/20 1721  GLUCAP 128*   Thyroid nodule:  CT showing enlarged heterogenous thyroid gland with dominant nodule on the left measuring at least 3 cm in diameter. -Thyroid function tests and ultrasound ordered for further evaluation  Mobility: Wheelchair/bedbound at baseline Code Status:   Code Status: Full Code  Nutritional status: There is no height or weight on file to calculate BMI.     Diet Order            Diet NPO time specified  Diet effective now                 DVT prophylaxis: Heparin drip   Antimicrobials:  None Fluid: None Consultants: Neurology, cardiology Family Communication:  None at bedside  Status is: Observation  The patient will require care spanning > 2 midnights and should be moved to inpatient because: Ongoing encephalopathy work-up, may need cardiac cath as well  Dispo: The patient is from: Home              Anticipated d/c is to: Home              Anticipated d/c date is: 2 days              Patient currently is not medically stable to d/c.       Infusions:  . heparin 700 Units/hr (10/07/20 0744)    Scheduled Meds: .  stroke: mapping our early stages of recovery book   Does not apply Once  . amiodarone  200 mg Oral Daily  . aspirin EC  81 mg Oral Daily  . metoprolol tartrate  25 mg Oral BID  . rosuvastatin  10 mg Oral Daily    Antimicrobials: Anti-infectives (From admission, onward)   None      PRN meds: acetaminophen **OR** acetaminophen (TYLENOL) oral liquid 160 mg/5 mL **OR** acetaminophen, hydrALAZINE   Objective: Vitals:   10/07/20 0730 10/07/20 0800  BP: (!) 161/81 (!) 160/68  Pulse: 72 72  Resp: 15 16  Temp:    SpO2: 100% 99%   No intake or output data in the 24  hours ending 10/07/20 0927 There were no vitals filed for this visit. Weight change:  There is no height or weight on file to calculate BMI.   Physical Exam: General exam: Not in distress.  Very thin built Skin: No rashes, lesions or ulcers. HEENT: Atraumatic, normocephalic, no obvious bleeding Lungs: Clear to auscultate bilaterally CVS: Regular rate and rhythm systolic ejection murmur, GI/Abd soft, nontender, nondistended, bowel sound present CNS: Alert, awake, oriented to place and person Psychiatry: Mood appropriate Extremities: No pedal edema, no calf tenderness,  Data Review: I have personally reviewed the laboratory data and studies available.  Recent Labs  Lab 10/06/20 1726 10/06/20 1728  WBC  --  8.2  NEUTROABS  --  6.1  HGB 16.3* 14.7  HCT 48.0* 46.8*  MCV  --  92.9  PLT  --  170   Recent Labs  Lab 10/06/20 1726 10/06/20  1728  NA 137 141  K 4.5 4.0  CL 95* 90*  CO2  --  25  GLUCOSE 176* 186*  BUN 29* 20  CREATININE 4.60* 4.75*  CALCIUM  --  9.1    F/u labs ordered.  Signed, Terrilee Croak, MD [facility] 10/07/2020

## 2020-10-07 NOTE — ED Notes (Signed)
Attempted twice to give report to [facility] .

## 2020-10-07 NOTE — Progress Notes (Signed)
ANTICOAGULATION CONSULT NOTE - Initial Consult  Pharmacy Consult for Heparin  Indication: chest pain/ACS, also has hx afib  Allergies  Allergen Reactions  . Ampicillin Hives and Rash    Has patient had a PCN reaction causing immediate rash, facial/tongue/throat swelling, SOB or lightheadedness with hypotension: No Has patient had a PCN reaction causing severe rash involving mucus membranes or skin necrosis: No Has patient had a PCN reaction that required hospitalization: No Has patient had a PCN reaction occurring within the last 10 years: No If all of the above answers are "NO", then may proceed with Cephalosporin use.   Marland Kitchen Penicillins Hives and Rash    Has patient had a PCN reaction causing immediate rash, facial/tongue/throat swelling, SOB or lightheadedness with hypotension: Yes Has patient had a PCN reaction causing severe rash involving mucus membranes or skin necrosis: No Has patient had a PCN reaction that required hospitalization: No Has patient had a PCN reaction occurring within the last 10 years: No If all of the above answers are "NO", then may proceed with Cephalosporin use.      Vital Signs: BP: 125/71 (12/19 0600) Pulse Rate: 73 (12/19 0600)  Labs: Recent Labs    10/06/20 1726 10/06/20 1728 10/06/20 1728 10/06/20 1947 10/06/20 2337 10/07/20 0205  HGB 16.3* 14.7  --   --   --   --   HCT 48.0* 46.8*  --   --   --   --   PLT  --  170  --   --   --   --   APTT  --  26  --   --   --   --   LABPROT  --  12.8  --   --   --   --   INR  --  1.0  --   --   --   --   CREATININE 4.60* 4.75*  --   --   --   --   TROPONINIHS  --  80*   < > 210* 681* 1,127*   < > = values in this interval not displayed.    CrCl cannot be calculated (Unknown ideal weight.).   Medical History: Past Medical History:  Diagnosis Date  . Anterior cerebral artery aneurysm    1996 AT [facility]  . Coronary atherosclerosis of native coronary artery    STEMI Feb 2009 - 70% distal LAD lesion  c/w plaque rupture, DES distal RCA  . Diastolic heart failure    LVEF 60-65% with grade 2 diastolic dysfunction - July 2014  . End-stage renal disease on hemodialysis ([place]) 02/03/2019  . ESRD (end stage renal disease) on dialysis ([place])   . HTN (hypertension)    Resistant  . Hypercholesterolemia   . Subarachnoid hemorrhage ([place])    03/2012  . Tobacco abuse    Resumed smoking half a pack a day. She was a smoker in the past and states she was abl eto stop in the past using nicotine patches  . Unspecified atrial fibrillation ([place]) 02/03/2019    Assessment: 59 y/o M with altered mental status. Troponin is rising [id number]). Also has hx of afib on no anti-coagulation PTA. Requested to start heparin drip. Dr. Marlowe Sax cleared starting heparin drip with neurology given history of SAH and hx of aneurysm with clips. CBC good. ESRD.   Goal of Therapy:  Heparin level 0.3-0.5 units/ml Monitor platelets by anticoagulation protocol: Yes   Plan:  Will defer bolus with neuro history Start heparin drip at  700 units/hr  1500 heparin level  Daily CBC/HL Monitor for bleeding  Narda Bonds, PharmD, BCPS Clinical Pharmacist Phone: [phone]

## 2020-10-07 NOTE — Procedures (Signed)
Patient Name: [name]  MRN: [id number]  Epilepsy Attending: Lora Havens  Referring Physician/Provider: Dr Derrick Ravel Date: 10/07/2020 Duration: 28.56 mins  Patient history: 59yo M with ams. EEG to evaluate for seizure  Level of alertness: Awake,  asleep  AEDs during EEG study: None  Technical aspects: This EEG study was done with scalp electrodes positioned according to the 10-20 International system of electrode placement. Electrical activity was acquired at a sampling rate of 500Hz  and reviewed with a high frequency filter of 70Hz  and a low frequency filter of 1Hz . EEG data were recorded continuously and digitally stored.   Description: The posterior dominant rhythm consists of 9 Hz activity of moderate voltage (25-35 uV) seen predominantly in posterior head regions, symmetric and reactive to eye opening and eye closing.  Sleep was characterized by vertex waves, sleep spindles (12 to 14 Hz), maximal frontocentral region. Hyperventilation and photic stimulation were not performed.     IMPRESSION: This study is within normal limits. No seizures or epileptiform discharges were seen throughout the recording.  Angeles Zehner Barbra Sarks

## 2020-10-07 NOTE — Progress Notes (Signed)
Hypoglycemic Event  CBG: 68  Treatment: 120 cc juice  Symptoms: asymptomatic  Follow-up CBG: Time: 2156 CBG Result: 92  Possible Reasons for Event: per pt, "had not eaten"  Comments/MD notified: Sharyon Cable

## 2020-10-07 NOTE — Progress Notes (Signed)
EEG complete - results pending 

## 2020-10-07 NOTE — Progress Notes (Signed)
ED called back twice for report, phone with no answer.

## 2020-10-07 NOTE — Consult Note (Signed)
[facility] Renal Consultation Note    Indication for Consultation:  Management of ESRD/hemodialysis; anemia, hypertension/volume and secondary hyperparathyroidism  MWN:UUVO, Marlene Lard, MD  HPI: [name] is a 59 y.o. female with ESRD on HD TTS at [facility].  Past medical history significant for s/p SAH, ICH x2 with VP shunt placement, seizure dz, dementia, CAD s/p PCI, chronic dCHF, and A fib.  Of note patient has varying compliance with dialysis, she attends every treatment but often signs off early.    Patient seen and examined at bedside.  Majority of history obtained through chart review due to patient mental status.  Presented to the ED after dialysis due to AMS.  Per OP dialysis nurse notes patient "BP is a "little elevated", patient lethargic, with eye droopy and patient not interested in food."  Her daughter was called and requested patient be taken to ED for evaluation.  Noted when EMS arrived patient in A fib with RVR, hypotensive, and altered. Code stroke initially called, then cancelled by neruology.  Per patient she became confused, with CP, SOB and nausea after dialysis yesterday.  States dialysis has been ok previously.  Currently feeling better.  Denies CP, SOB, n/v/d, abdominal, weakness, dizziness and fatigue.  Oriented to self only.   Pertinent findings in the ED include hypertension, tachycardia, CT head/neck with no acute findings, CXR with no acute findings, negative resp panel, ECHO with EF 65-70% and severe LVH, as well as a rising troponin 80>210>681>1127>1480.  Patient has been admitted for further evaluation and management.    Past Medical History:  Diagnosis Date  . Anterior cerebral artery aneurysm    1996 AT [facility]  . Coronary atherosclerosis of native coronary artery    STEMI Feb 2009 - 70% distal LAD lesion c/w plaque rupture, DES distal RCA  . Diastolic heart failure    LVEF 60-65% with grade 2 diastolic dysfunction - July 2014  .  End-stage renal disease on hemodialysis ([place]) 02/03/2019  . ESRD (end stage renal disease) on dialysis ([place])   . HTN (hypertension)    Resistant  . Hypercholesterolemia   . Subarachnoid hemorrhage ([place])    03/2012  . Tobacco abuse    Resumed smoking half a pack a day. She was a smoker in the past and states she was abl eto stop in the past using nicotine patches  . Unspecified atrial fibrillation ([place]) 02/03/2019   Past Surgical History:  Procedure Laterality Date  . ABDOMINAL HYSTERECTOMY    . BASCILIC VEIN TRANSPOSITION Right 05/13/2013   Procedure: [place];  Surgeon: Rosetta Posner, MD;  Location: [place];  Service: Vascular;  Laterality: Right;  . BRAIN SURGERY     2   . LOOP RECORDER IMPLANT N/A 11/14/2013   Procedure: LOOP RECORDER IMPLANT;  Surgeon: Evans Lance, MD;  Location: [facility] CATH LAB;  Service: Cardiovascular;  Laterality: N/A;  . TEE WITHOUT CARDIOVERSION N/A 11/14/2013   Procedure: TRANSESOPHAGEAL ECHOCARDIOGRAM (TEE);  Surgeon: Candee Furbish, MD;  Location: [facility] ENDOSCOPY;  Service: Cardiovascular;  Laterality: N/A;  . VENTRICULOPERITONEAL SHUNT  03/27/2012   Procedure: SHUNT INSERTION VENTRICULAR-PERITONEAL;  Surgeon: Winfield Cunas, MD;  Location: [place] NEURO ORS;  Service: Neurosurgery;  Laterality: N/A;  Ventricular-Peritoneal Shunt Insertion   Family History  Problem Relation Age of Onset  . Heart disease Mother   . Heart disease Father   . Coronary artery disease Other        Premature in 1st degree relatives   Social  History:  reports that she quit smoking about 3 years ago. Her smoking use included cigarettes. She has a 7.00 pack-year smoking history. She has never used smokeless tobacco. She reports that she does not drink alcohol and does not use drugs. Allergies  Allergen Reactions  . Ampicillin Hives and Rash    Has patient had a PCN reaction causing immediate rash, facial/tongue/throat swelling, SOB or lightheadedness with hypotension: No Has  patient had a PCN reaction causing severe rash involving mucus membranes or skin necrosis: No Has patient had a PCN reaction that required hospitalization: No Has patient had a PCN reaction occurring within the last 10 years: No If all of the above answers are "NO", then may proceed with Cephalosporin use.   Marland Kitchen Penicillins Hives and Rash    Has patient had a PCN reaction causing immediate rash, facial/tongue/throat swelling, SOB or lightheadedness with hypotension: Yes Has patient had a PCN reaction causing severe rash involving mucus membranes or skin necrosis: No Has patient had a PCN reaction that required hospitalization: No Has patient had a PCN reaction occurring within the last 10 years: No If all of the above answers are "NO", then may proceed with Cephalosporin use.    Prior to Admission medications   Medication Sig Start Date End Date Taking? Authorizing Provider  amiodarone (PACERONE) 200 MG tablet Take 2 tabs (400mg ) daily for 7 days, then 1 tab (200mg ) daily thereafter 03/24/18   Rai, Vernelle Emerald, MD  aspirin 81 MG EC tablet Take by mouth. 12/31/17   [provider]  calcitRIOL (ROCALTROL) 0.5 MCG capsule Take 1 capsule (0.5 mcg total) by mouth Every Tuesday,Thursday,and Saturday with dialysis. 02/05/19   Hongalgi, Lenis Dickinson, MD  calcium acetate (PHOSLO) 667 MG capsule Take 667 mg by mouth 3 (three) times daily. 11/28/19   [provider]  cinacalcet (SENSIPAR) 30 MG tablet Take 2 tablets (60 mg total) by mouth Every Tuesday,Thursday,and Saturday with dialysis. 02/05/19   Hongalgi, Lenis Dickinson, MD  iron sucrose in sodium chloride 0.9 % 100 mL Iron Sucrose (Venofer) 11/24/19 11/22/20  [provider]  lactulose (CHRONULAC) 10 GM/15ML solution Take 10 g by mouth daily as needed for moderate constipation.     [provider]  levETIRAcetam (KEPPRA) 500 MG tablet Take 500 mg by mouth 2 (two) times daily.    [provider]  multivitamin (RENA-VIT) TABS tablet  Take 1 tablet by mouth daily.    [provider]  nitroGLYCERIN (NITROSTAT) 0.4 MG SL tablet Place 1 tablet (0.4 mg total) under the tongue every 5 (five) minutes as needed for chest pain. 05/18/13   Janece Canterbury, MD  omeprazole (PRILOSEC) 20 MG capsule Take 20 mg by mouth daily.    [provider]  sucroferric oxyhydroxide (VELPHORO) 500 MG chewable tablet Chew 2 tablets (1,000 mg total) by mouth 3 (three) times daily with meals. 02/04/19   Modena Jansky, MD   Current Facility-Administered Medications  Medication Dose Route Frequency Provider Last Rate Last Admin  .  stroke: mapping our early stages of recovery book   Does not apply Once Shela Leff, MD      . acetaminophen (TYLENOL) tablet 650 mg  650 mg Oral Q4H PRN Shela Leff, MD       Or  . acetaminophen (TYLENOL) 160 MG/5ML solution 650 mg  650 mg Per Tube Q4H PRN Shela Leff, MD       Or  . acetaminophen (TYLENOL) suppository 650 mg  650 mg Rectal Q4H  PRN Shela Leff, MD      . amiodarone (PACERONE) tablet 200 mg  200 mg Oral Daily Shela Leff, MD   200 mg at 10/06/20 1947  . aspirin EC tablet 81 mg  81 mg Oral Daily Shela Leff, MD      . heparin ADULT infusion 100 units/mL (25000 units/231mL sodium chloride 0.45%)  700 Units/hr Intravenous Continuous Erenest Blank, RPH 7 mL/hr at 10/07/20 0744 700 Units/hr at 10/07/20 0744  . hydrALAZINE (APRESOLINE) injection 5 mg  5 mg Intravenous Q4H PRN Shela Leff, MD      . metoprolol tartrate (LOPRESSOR) tablet 25 mg  25 mg Oral BID Bethel Born B, MD      . rosuvastatin (CRESTOR) tablet 10 mg  10 mg Oral Daily Basil Dess, MD       Current Outpatient Medications  Medication Sig Dispense Refill  . amiodarone (PACERONE) 200 MG tablet Take 2 tabs (400mg ) daily for 7 days, then 1 tab (200mg ) daily thereafter 60 tablet 1  . aspirin 81 MG EC tablet Take by mouth.    . calcitRIOL (ROCALTROL) 0.5 MCG capsule Take 1  capsule (0.5 mcg total) by mouth Every Tuesday,Thursday,and Saturday with dialysis. 15 capsule 0  . calcium acetate (PHOSLO) 667 MG capsule Take 667 mg by mouth 3 (three) times daily.    . cinacalcet (SENSIPAR) 30 MG tablet Take 2 tablets (60 mg total) by mouth Every Tuesday,Thursday,and Saturday with dialysis. 30 tablet 0  . iron sucrose in sodium chloride 0.9 % 100 mL Iron Sucrose (Venofer)    . lactulose (CHRONULAC) 10 GM/15ML solution Take 10 g by mouth daily as needed for moderate constipation.     . levETIRAcetam (KEPPRA) 500 MG tablet Take 500 mg by mouth 2 (two) times daily.    . multivitamin (RENA-VIT) TABS tablet Take 1 tablet by mouth daily.    . nitroGLYCERIN (NITROSTAT) 0.4 MG SL tablet Place 1 tablet (0.4 mg total) under the tongue every 5 (five) minutes as needed for chest pain. 20 tablet 0  . omeprazole (PRILOSEC) 20 MG capsule Take 20 mg by mouth daily.    . sucroferric oxyhydroxide (VELPHORO) 500 MG chewable tablet Chew 2 tablets (1,000 mg total) by mouth 3 (three) times daily with meals. 180 tablet 0   Labs: Basic Metabolic Panel: Recent Labs  Lab 10/06/20 1726 10/06/20 1728  NA 137 141  K 4.5 4.0  CL 95* 90*  CO2  --  25  GLUCOSE 176* 186*  BUN 29* 20  CREATININE 4.60* 4.75*  CALCIUM  --  9.1   Liver Function Tests: Recent Labs  Lab 10/06/20 1728  AST 18  ALT 12  ALKPHOS 61  BILITOT 0.3  PROT 8.3*  ALBUMIN 4.0    Recent Labs  Lab 10/07/20 0205  AMMONIA 18   CBC: Recent Labs  Lab 10/06/20 1726 10/06/20 1728  WBC  --  8.2  NEUTROABS  --  6.1  HGB 16.3* 14.7  HCT 48.0* 46.8*  MCV  --  92.9  PLT  --  170   CBG: Recent Labs  Lab 10/06/20 1721  GLUCAP 128*   Studies/Results: CT Angio Head W/Cm &/Or Wo Cm  Result Date: 10/06/2020 CLINICAL DATA:  Weakness.  Acute stroke presentation. EXAM: CT ANGIOGRAPHY HEAD AND NECK TECHNIQUE: Multidetector CT imaging of the head and neck was performed using the standard protocol during bolus  administration of intravenous contrast. Multiplanar CT image reconstructions and MIPs were obtained to evaluate the vascular  anatomy. Carotid stenosis measurements (when applicable) are obtained utilizing NASCET criteria, using the distal internal carotid diameter as the denominator. CONTRAST:  105mL OMNIPAQUE IOHEXOL 350 MG/ML SOLN COMPARISON:  Head CT same day. FINDINGS: CTA NECK FINDINGS Aortic arch: Aortic atherosclerosis. No flow limiting stenosis of the brachiocephalic vessel origins. Right carotid system: Common carotid artery shows atherosclerotic calcification but is patent to the bifurcation. Calcified plaque at the carotid bifurcation and ICA bulb. Minimal diameter of the distal bulb is 4 mm. Compared to a more distal cervical ICA diameter of 4 mm, there is no stenosis. Left carotid system: Common carotid artery shows scattered atherosclerotic plaque but is patent to the bifurcation. Calcified plaque at the carotid bifurcation and ICA bulb. Minimal diameter in the ICA bulb measures 2 mm. Compared to a more distal cervical ICA diameter of 4 mm, this indicates a 50% stenosis. Vertebral arteries: Calcified plaque at both vertebral artery origins. Estimated 50% stenosis at both vertebral artery origins. Other areas of calcified plaque within both vertebral arteries present additional stenoses, more severe on the right than the left. Both vessels do remain patent through the cervical region to the foramen magnum. Skeleton: Ordinary cervical spondylosis. Other neck: Enlarged heterogeneous thyroid gland. Dominant nodule inferior left measuring at least 3 cm in diameter. Upper chest: No active disease. Review of the MIP images confirms the above findings CTA HEAD FINDINGS Anterior circulation: Both internal carotid arteries are patent through the skull base and siphon regions. Extensive siphon atherosclerotic calcification with stenosis estimated at 50% on both sides. Aneurysm clip adjacent to the supraclinoid ICA  on the right. No acute large or medium vessel occlusion identified. Second aneurysm clip at the right MCA bifurcation region. Posterior circulation: Both vertebral arteries are patent through the foramen magnum. 50% stenosis of the right vertebral artery V4 segment. Both vertebral arteries reach the basilar. Basilar artery shows some atherosclerotic narrowing and irregularity without a severe stenosis. 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 No sign of recurrent flow or coil compaction. Venous sinuses: Patent and normal. Anatomic variants: None other significant. Review of the MIP images confirms the above findings IMPRESSION: 1. No acute large or medium vessel occlusion. 2. Aortic atherosclerosis. 3. Atherosclerotic disease at both carotid bifurcations. No measurable stenosis on the right. 50% stenosis of the ICA bulb on the left. 4. 50% stenosis at both vertebral artery origins. Additional stenoses along the cervical course of both vertebral arteries. 50% stenosis of the right vertebral artery V4 segment. 5. 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 No sign of recurrent flow or coil compaction. 6. Aneurysm clips at the right MCA bifurcation and supraclinoid ICA region. No sign of residual aneurysmal flow. 7. 50% stenosis of both carotid siphon regions. 8. Enlarged heterogeneous thyroid gland with dominant nodule on the left measuring at least 3 cm in diameter. Recommend thyroid US (ref: J Am Coll Radiol. 2015 Feb;12(2): 143-50). Aortic Atherosclerosis (ICD10-I70.0). Electronically Signed   By: Nelson Chimes M.D.   On: 10/06/2020 22:03   DG Chest 2 View  Result Date: 10/06/2020 CLINICAL DATA:  Altered level of consciousness EXAM: CHEST - 2 VIEW COMPARISON:  02/02/2019 FINDINGS: Frontal and lateral views of the chest demonstrates stable ventriculostomy catheter within the right anterior chest. Cardiac silhouette is stable. No acute airspace disease, effusion, or pneumothorax. No acute bony  abnormalities. IMPRESSION: 1. Stable chest, no acute process. Electronically Signed   By: Randa Ngo M.D.   On: 10/06/2020 23:30   CT Angio  Neck W and/or Wo Contrast  Result Date: 10/06/2020 CLINICAL DATA:  Weakness.  Acute stroke presentation. EXAM: CT ANGIOGRAPHY HEAD AND NECK TECHNIQUE: Multidetector CT imaging of the head and neck was performed using the standard protocol during bolus administration of intravenous contrast. Multiplanar CT image reconstructions and MIPs were obtained to evaluate the vascular anatomy. Carotid stenosis measurements (when applicable) are obtained utilizing NASCET criteria, using the distal internal carotid diameter as the denominator. CONTRAST:  46mL OMNIPAQUE IOHEXOL 350 MG/ML SOLN COMPARISON:  Head CT same day. FINDINGS: CTA NECK FINDINGS Aortic arch: Aortic atherosclerosis. No flow limiting stenosis of the brachiocephalic vessel origins. Right carotid system: Common carotid artery shows atherosclerotic calcification but is patent to the bifurcation. Calcified plaque at the carotid bifurcation and ICA bulb. Minimal diameter of the distal bulb is 4 mm. Compared to a more distal cervical ICA diameter of 4 mm, there is no stenosis. Left carotid system: Common carotid artery shows scattered atherosclerotic plaque but is patent to the bifurcation. Calcified plaque at the carotid bifurcation and ICA bulb. Minimal diameter in the ICA bulb measures 2 mm. Compared to a more distal cervical ICA diameter of 4 mm, this indicates a 50% stenosis. Vertebral arteries: Calcified plaque at both vertebral artery origins. Estimated 50% stenosis at both vertebral artery origins. Other areas of calcified plaque within both vertebral arteries present additional stenoses, more severe on the right than the left. Both vessels do remain patent through the cervical region to the foramen magnum. Skeleton: Ordinary cervical spondylosis. Other neck: Enlarged heterogeneous thyroid gland. Dominant nodule  inferior left measuring at least 3 cm in diameter. Upper chest: No active disease. Review of the MIP images confirms the above findings CTA HEAD FINDINGS Anterior circulation: Both internal carotid arteries are patent through the skull base and siphon regions. Extensive siphon atherosclerotic calcification with stenosis estimated at 50% on both sides. Aneurysm clip adjacent to the supraclinoid ICA on the right. No acute large or medium vessel occlusion identified. Second aneurysm clip at the right MCA bifurcation region. Posterior circulation: Both vertebral arteries are patent through the foramen magnum. 50% stenosis of the right vertebral artery V4 segment. Both vertebral arteries reach the basilar. Basilar artery shows some atherosclerotic narrowing and irregularity without a severe stenosis. 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 No sign of recurrent flow or coil compaction. Venous sinuses: Patent and normal. Anatomic variants: None other significant. Review of the MIP images confirms the above findings IMPRESSION: 1. No acute large or medium vessel occlusion. 2. Aortic atherosclerosis. 3. Atherosclerotic disease at both carotid bifurcations. No measurable stenosis on the right. 50% stenosis of the ICA bulb on the left. 4. 50% stenosis at both vertebral artery origins. Additional stenoses along the cervical course of both vertebral arteries. 50% stenosis of the right vertebral artery V4 segment. 5. 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 No sign of recurrent flow or coil compaction. 6. Aneurysm clips at the right MCA bifurcation and supraclinoid ICA region. No sign of residual aneurysmal flow. 7. 50% stenosis of both carotid siphon regions. 8. Enlarged heterogeneous thyroid gland with dominant nodule on the left measuring at least 3 cm in diameter. Recommend thyroid US (ref: J Am Coll Radiol. 2015 Feb;12(2): 143-50). Aortic Atherosclerosis (ICD10-I70.0). Electronically Signed   By: Nelson Chimes M.D.   On: 10/06/2020 22:03   EEG adult  Result Date: 10/07/2020 Lora Havens, MD     10/07/2020 11:06 AM Patient Name: Aryelle Figg MRN: [id number] Epilepsy  Attending: Lora Havens Referring Physician/Provider: Dr Derrick Ravel Date: 10/07/2020 Duration: 28.56 mins Patient history: 58yo M with ams. EEG to evaluate for seizure Level of alertness: Awake,  asleep AEDs during EEG study: None Technical aspects: This EEG study was done with scalp electrodes positioned according to the 10-20 International system of electrode placement. Electrical activity was acquired at a sampling rate of 500Hz  and reviewed with a high frequency filter of 70Hz  and a low frequency filter of 1Hz . EEG data were recorded continuously and digitally stored. Description: The posterior dominant rhythm consists of 9 Hz activity of moderate voltage (25-35 uV) seen predominantly in posterior head regions, symmetric and reactive to eye opening and eye closing.  Sleep was characterized by vertex waves, sleep spindles (12 to 14 Hz), maximal frontocentral region. Hyperventilation and photic stimulation were not performed.   IMPRESSION: This study is within normal limits. No seizures or epileptiform discharges were seen throughout the recording. Lora Havens   ECHOCARDIOGRAM COMPLETE  Result Date: 10/07/2020    ECHOCARDIOGRAM REPORT   Patient Name:   MEKENZIE MODESTE Date of Exam: 10/07/2020 Medical Rec #:  [id number]      Height:       61.0 in Accession #:    [id number]     Weight:       103.3 lb Date of Birth:  1961-09-08      BSA:          1.426 m Patient Age:    83 years       BP:           160/68 mmHg Patient Gender: F              HR:           68 bpm. Exam Location:  Inpatient Procedure: 2D Echo, 3D Echo, Cardiac Doppler and Color Doppler Indications:    TIA  History:        Patient has prior history of Echocardiogram examinations, most                 recent 03/23/2018. CHF, CAD, Abnormal ECG, Arrythmias:Atrial                  Fibrillation, Signs/Symptoms:Altered Mental Status, Chest Pain,                 Dyspnea and Syncope; Risk Factors:Hypertension, Dyslipidemia and                 Current Smoker. ESRD. Elevated troponin.  Sonographer:    Roseanna Rainbow RDCS Referring Phys: [id number] [place]  1. Left ventricular ejection fraction, by estimation, is 65 to 70%. The left ventricle has normal function. The left ventricle has no regional wall motion abnormalities. There is severe left ventricular hypertrophy. Left ventricular diastolic parameters  are indeterminate. Elevated left atrial pressure.  2. Right ventricular systolic function is normal. The right ventricular size is normal. There is normal pulmonary artery systolic pressure.  3. Left atrial size was mildly dilated.  4. The mitral valve is normal in structure. No evidence of mitral valve regurgitation. No evidence of mitral stenosis.  5. The aortic valve is tricuspid. There is mild calcification of the aortic valve. There is mild thickening of the aortic valve. Aortic valve regurgitation is not visualized. No aortic stenosis is present.  6. The inferior vena cava is normal in size with greater than 50% respiratory variability, suggesting right atrial pressure of 3 mmHg. FINDINGS  Left Ventricle:  Left ventricular ejection fraction, by estimation, is 65 to 70%. The left ventricle has normal function. The left ventricle has no regional wall motion abnormalities. The left ventricular internal cavity size was normal in size. There is  severe left ventricular hypertrophy. Left ventricular diastolic parameters are indeterminate. Elevated left atrial pressure. Right Ventricle: The right ventricular size is normal. No increase in right ventricular wall thickness. Right ventricular systolic function is normal. There is normal pulmonary artery systolic pressure. The tricuspid regurgitant velocity is 2.08 m/s, and  with an assumed right atrial pressure of 3 mmHg, the  estimated right ventricular systolic pressure is 60.7 mmHg. Left Atrium: Left atrial size was mildly dilated. Right Atrium: Right atrial size was normal in size. Pericardium: There is no evidence of pericardial effusion. Mitral Valve: The mitral valve is normal in structure. No evidence of mitral valve regurgitation. No evidence of mitral valve stenosis. Tricuspid Valve: The tricuspid valve is normal in structure. Tricuspid valve regurgitation is mild . No evidence of tricuspid stenosis. Aortic Valve: The aortic valve is tricuspid. There is mild calcification of the aortic valve. There is mild thickening of the aortic valve. There is mild aortic valve annular calcification. Aortic valve regurgitation is not visualized. No aortic stenosis  is present. Aortic valve mean gradient measures 3.4 mmHg. Aortic valve peak gradient measures 7.7 mmHg. Aortic valve area, by VTI measures 2.56 cm. Pulmonic Valve: The pulmonic valve was not well visualized. Pulmonic valve regurgitation is not visualized. No evidence of pulmonic stenosis. Aorta: The aortic root is normal in size and structure. Pulmonary Artery: No significant pulmonary HTN, PASP is 20 mmHg. Venous: The inferior vena cava is normal in size with greater than 50% respiratory variability, suggesting right atrial pressure of 3 mmHg. IAS/Shunts: No atrial level shunt detected by color flow Doppler.  LEFT VENTRICLE PLAX 2D LVIDd:         3.40 cm     Diastology LVIDs:         2.20 cm     LV e' medial:    4.35 cm/s LV PW:         1.80 cm     LV E/e' medial:  22.0 LV IVS:        1.90 cm     LV e' lateral:   5.22 cm/s LVOT diam:     1.90 cm     LV E/e' lateral: 18.3 LV SV:         73 LV SV Index:   51 LVOT Area:     2.84 cm  LV Volumes (MOD) LV vol d, MOD A2C: 68.2 ml LV vol d, MOD A4C: 82.9 ml LV vol s, MOD A2C: 15.1 ml LV vol s, MOD A4C: 29.4 ml LV SV MOD A2C:     53.1 ml LV SV MOD A4C:     82.9 ml LV SV MOD BP:      53.4 ml RIGHT VENTRICLE             IVC RV S prime:      10.80 cm/s  IVC diam: 1.20 cm TAPSE (M-mode): 2.1 cm LEFT ATRIUM           Index       RIGHT ATRIUM           Index LA diam:      3.40 cm 2.38 cm/m  RA Area:     13.50 cm LA Vol (A2C): 42.5 ml 29.80 ml/m RA Volume:   33.20 ml  23.28 ml/m LA Vol (A4C): 21.1 ml 14.79 ml/m  AORTIC VALVE AV Area (Vmax):    2.40 cm AV Area (Vmean):   2.39 cm AV Area (VTI):     2.56 cm AV Vmax:           138.50 cm/s AV Vmean:          86.255 cm/s AV VTI:            0.285 m AV Peak Grad:      7.7 mmHg AV Mean Grad:      3.4 mmHg LVOT Vmax:         117.00 cm/s LVOT Vmean:        72.700 cm/s LVOT VTI:          0.258 m LVOT/AV VTI ratio: 0.90  AORTA Ao Root diam: 3.00 cm Ao Asc diam:  3.00 cm MITRAL VALVE               TRICUSPID VALVE MV Area (PHT): 4.31 cm    TR Peak grad:   17.3 mmHg MV Decel Time: 176 msec    TR Vmax:        208.00 cm/s MV E velocity: 95.70 cm/s MV A velocity: 93.00 cm/s  SHUNTS MV E/A ratio:  1.03        Systemic VTI:  0.26 m                            Systemic Diam: 1.90 cm Carlyle Dolly MD Electronically signed by Carlyle Dolly MD Signature Date/Time: 10/07/2020/11:53:40 AM    Final    CT HEAD CODE STROKE WO CONTRAST  Result Date: 10/06/2020 CLINICAL DATA:  Code stroke.  Weakness EXAM: CT HEAD WITHOUT CONTRAST TECHNIQUE: Contiguous axial images were obtained from the base of the skull through the vertex without intravenous contrast. COMPARISON:  02/16/2018 FINDINGS: Brain: Old small vessel cerebellar infarctions as seen previously. Cerebral hemispheres show old infarction in the left parietal lobe which is unchanged. Extensive chronic small-vessel ischemic changes of the white matter as seen previously. Old infarction in the left basal ganglia. Right VP shunt placed from a frontal approach enters the right lateral ventricle, extends through the foramen of Monro into the third ventricle and has its tip in the region of the left midbrain. 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 Previous  aneurysm clip at the base of the brain on the right. No sign of acute infarction, hemorrhage, hydrocephalus or extra-axial collection. Vascular: There is atherosclerotic calcification of the major vessels at the base of the brain. Skull: Craniotomy on the right in the distant past. No acute finding. Sinuses/Orbits: Clear/normal Other: None IMPRESSION: 1. No acute finding by CT. Extensive chronic small-vessel ischemic changes throughout the brain as seen previously. 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 2. Right VP shunt in place. No hydrocephalus. 3. These results were communicated to [facility] at 5:43 pmon 12/18/2021by text page via the [facility] messaging system. Electronically Signed   By: Nelson Chimes M.D.   On: 10/06/2020 17:44    ROS: .Unable to complete full ROS due to patient AMS.  Physical Exam: Vitals:   10/07/20 1015 10/07/20 1115 10/07/20 1130 10/07/20 1215  BP:    (!) 148/65  Pulse: 64 67 69 66  Resp: 15 (!) 21 14 13   Temp:      TempSrc:      SpO2: 98% 100% 96% 98%     General: Thin,  frail female in NAD Head: NCAT sclera not icteric MMM Neck: Supple. No lymphadenopathy Lungs: CTA bilaterally. No wheeze, rales or rhonchi. Breathing is unlabored. Heart: RRR. No murmur, rubs or gallops.  Abdomen: soft, nontender, +BS, no guarding, no rebound tenderness Lower extremities:no edema, ischemic changes, or open wounds  Neuro: A. Oriented to self only. Moves all extremities spontaneously.  Appears close to baseline.  Dialysis Access:RU AVF +b/t  Dialysis Orders:  TTS - [facility]  4hrs, BFR 400, DFR 800,  EDW 37kg, 2K/ 2Ca  Access: RU AVF  Heparin none Sensipar 120mg  qHD Calcitriol 35mcg qHD  Assessment/Plan: 1.  AMS - CT head/neck and CXR negative for acute process.  EEG negative. Improving, appears close to her normal baseline at dialysis. Wkup per admit.  2. Rising troponin/NSTEMI - Known Hx CAD.  TTE with preserved function. Not a good candidate for cath per cardio,  started on heparin drip x 48hrs - cleared by neruo.  Plan for medical management at this time. Per cardio 3. A fib - converted back to NSR.  On amiodarone. Per cardio 4.  ESRD -  On HD TTS.  No acute dialysis needs.  Plan for HD 12/21 per regular schedule. K 4.0. 5.  Hypertension/volume  - BP elevated, continue home meds. Euvolemic on exam.  UF to dry weight.  6.  Anemia of CKD - Hgb 14.7. No indication for ESA. 7.  Secondary Hyperparathyroidism -  Ca in goal. Will check phos. Continue VDRA, sensipar and binders 8.  Nutrition - Renal diet w/fluid restrictions. 9. Seizure disorder - EEG negative 10. Hx ICH x2 with VP shunt placement - stable on CT. Per neuro 11. Dementia  Jen Mow, PA-C [facility] 10/07/2020, 12:59 PM

## 2020-10-07 NOTE — Consult Note (Signed)
Cardiology Consultation:  [name] ID: Diane [name] MRN: [id number]; DOB: Nov 26, 1960  Admit date: 10/06/2020 Date of Consult: 10/07/2020  Primary Care Provider: Garwin Brothers, MD Primary Cardiologist: Shelva Majestic, MD  Primary Electrophysiologist:  None   [name] Profile:  Diane [name] is a 59 y.o. female with a hx of AF/CAD who is being seen today for the evaluation of AF WITH RVR and positive trop at the request of Dr Marlowe Sax.  History of Present Illness:  Diane [name] with a past medical history positive for hypertension, hyperlipidemia, diabetes, ESRD on HD, paroxysmal AF not on anticoagulation secondary to a history of ICH and SAH secondary to brain aneurysm with clipping done in 2013 and coiling, coronary artery disease sp STEMI in 2009 and DES to RCA, with 70% disease over distal LAD and 50% distal Lcxe as reported in the chart, who presented to the ER via EMS for AMS and poor responsiveness noted in the dialysis unit. On arrival a code stroke was activated and neurology did see the [name]. Then the code stroke was cancelled. [name] was in AF with RVR in the ER, Cardizem IV and po amiodarone given. [name] converted back to NSR.  When seen, the [name] was more awake, denies any chest pain or shortness of breath, no fever or chills. [name] is cachectic and wanted to sleep.  Troponin were ordered in the ER, and came back positive, so cardiology were asked to consult on the [name]. No nausea or vomiting. [name] with OA and back pain. She was more awake compared to when she came in.       Past Medical History: Past Medical History:  Diagnosis Date  . Anterior cerebral artery aneurysm    1996 AT [facility]  . Coronary atherosclerosis of native coronary artery    STEMI Feb 2009 - 70% distal LAD lesion c/w plaque rupture, DES distal RCA  . Diastolic heart failure    LVEF 60-65% with grade 2 diastolic dysfunction - July 2014  . End-stage renal disease on hemodialysis ([place])  02/03/2019  . ESRD (end stage renal disease) on dialysis ([place])   . HTN (hypertension)    Resistant  . Hypercholesterolemia   . Subarachnoid hemorrhage ([place])    03/2012  . Tobacco abuse    Resumed smoking half a pack a day. She was a smoker in the past and states she was abl eto stop in the past using nicotine patches  . Unspecified atrial fibrillation ([place]) 02/03/2019    Past Surgical History: Past Surgical History:  Procedure Laterality Date  . ABDOMINAL HYSTERECTOMY    . BASCILIC VEIN TRANSPOSITION Right 05/13/2013   Procedure: [place];  Surgeon: Rosetta Posner, MD;  Location: [facility];  Service: Vascular;  Laterality: Right;  . BRAIN SURGERY     2   . LOOP RECORDER IMPLANT N/A 11/14/2013   Procedure: LOOP RECORDER IMPLANT;  Surgeon: Evans Lance, MD;  Location: [facility] CATH LAB;  Service: Cardiovascular;  Laterality: N/A;  . TEE WITHOUT CARDIOVERSION N/A 11/14/2013   Procedure: TRANSESOPHAGEAL ECHOCARDIOGRAM (TEE);  Surgeon: Candee Furbish, MD;  Location: [facility] END[facility]COPY;  Service: Cardiovascular;  Laterality: N/A;  . VENTRICULOPERITONEAL SHUNT  03/27/2012   Procedure: SHUNT INSERTION VENTRICULAR-PERITONEAL;  Surgeon: Winfield Cunas, MD;  Location: [facility] NEURO ORS;  Service: Neurosurgery;  Laterality: N/A;  Ventricular-Peritoneal Shunt Insertion     Home Medications:  Prior to Admission medications   Medication Sig Start Date End Date Taking? Authorizing Provider  amiodarone (  PACERONE) 200 MG tablet Take 2 tabs (400mg ) daily for 7 days, then 1 tab (200mg ) daily thereafter 03/24/18   Rai, Vernelle Emerald, MD  aspirin 81 MG EC tablet Take by mouth. 12/31/17   [provider]  calcitRIOL (ROCALTROL) 0.5 MCG capsule Take 1 capsule (0.5 mcg total) by mouth Every Tuesday,Thursday,and Saturday with dialysis. 02/05/19   Hongalgi, Lenis Dickinson, MD  calcium acetate (PHOSLO) 667 MG capsule Take 667 mg by mouth 3 (three) times daily. 11/28/19   [provider]  cinacalcet (SENSIPAR) 30 MG  tablet Take 2 tablets (60 mg total) by mouth Every Tuesday,Thursday,and Saturday with dialysis. 02/05/19   Hongalgi, Lenis Dickinson, MD  iron sucrose in sodium chloride 0.9 % 100 mL Iron Sucrose (Venofer) 11/24/19 11/22/20  [provider]  lactulose (CHRONULAC) 10 GM/15ML solution Take 10 g by mouth daily as needed for moderate constipation.     [provider]  levETIRAcetam (KEPPRA) 500 MG tablet Take 500 mg by mouth 2 (two) times daily.    [provider]  multivitamin (RENA-VIT) TABS tablet Take 1 tablet by mouth daily.    [provider]  nitroGLYCERIN (NITROSTAT) 0.4 MG SL tablet Place 1 tablet (0.4 mg total) under the tongue every 5 (five) minutes as needed for chest pain. 05/18/13   Janece Canterbury, MD  omeprazole (PRILOSEC) 20 MG capsule Take 20 mg by mouth daily.    [provider]  sucroferric oxyhydroxide (VELPHORO) 500 MG chewable tablet Chew 2 tablets (1,000 mg total) by mouth 3 (three) times daily with meals. 02/04/19   Modena Jansky, MD    Inpatient Medications: Scheduled Meds: .  stroke: mapping our early stages of recovery book   Does not apply Once  . amiodarone  200 mg Oral Daily  . aspirin EC  81 mg Oral Daily  . heparin  5,000 Units Subcutaneous Q8H   Continuous Infusions:  PRN Meds: acetaminophen **OR** acetaminophen (TYLENOL) oral liquid 160 mg/5 mL **OR** acetaminophen, hydrALAZINE  Allergies:    Allergies  Allergen Reactions  . Ampicillin Hives and Rash    Has [name] had a PCN reaction causing immediate rash, facial/tongue/throat swelling, SOB or lightheadedness with hypotension: No Has [name] had a PCN reaction causing severe rash involving mucus membranes or skin necrosis: No Has [name] had a PCN reaction that required hospitalization: No Has [name] had a PCN reaction occurring within the last 10 years: No If all of the above answers are "NO", then may proceed with Cephalosporin use.   Marland Kitchen Penicillins Hives and Rash     Has [name] had a PCN reaction causing immediate rash, facial/tongue/throat swelling, SOB or lightheadedness with hypotension: Yes Has [name] had a PCN reaction causing severe rash involving mucus membranes or skin necrosis: No Has [name] had a PCN reaction that required hospitalization: No Has [name] had a PCN reaction occurring within the last 10 years: No If all of the above answers are "NO", then may proceed with Cephalosporin use.     Social History:   Social History   Socioeconomic History  . Marital status: Single    Spouse name: Not on file  . Number of children: Not on file  . Years of education: Not on file  . Highest education level: Not on file  Occupational History  . Not on file  Tobacco Use  . Smoking status: Former Smoker    Packs/day: 0.20    Years: 35.00    Pack years: 7.00    Types: Cigarettes  Quit date: 03/20/2017    Years since quitting: 3.5  . Smokeless tobacco: Never Used  . Tobacco comment: 1/2 ppd   Vaping Use  . Vaping Use: Never used  Substance and Sexual Activity  . Alcohol use: No  . Drug use: No  . Sexual activity: Never  Other Topics Concern  . Not on file  Social History Narrative   Lives by herself but helps care for her 8 grandchildren.    Social Determinants of Health   Financial Resource Strain: Not on file  Food Insecurity: Not on file  Transportation Needs: Not on file  Physical Activity: Not on file  Stress: Not on file  Social Connections: Not on file  Intimate Partner Violence: Not on file     Family History:    Family History  Problem Relation Age of Onset  . Heart disease Mother   . Heart disease Father   . Coronary artery disease Other        Premature in 1st degree relatives     ROS:  All other ROS reviewed and negative ([name] is not very cooperant and wanted to sleep). Pertinent positives noted in the HPI.     Physical Exam/Data:   Vitals:   10/06/20 2030 10/06/20 2100 10/06/20 2115 10/06/20 2208   BP: (!) 144/78 (!) 141/93 (!) 146/65 (!) 147/75  [name]: 75 78 76 74  Resp: 20 15 19 17   Temp:      TempSrc:      SpO2: 100% 100% 100% 100%   No intake or output data in the 24 hours ending 10/07/20 0044  Last 3 Weights 12/06/2019 02/03/2019 02/03/2019  Weight (lbs) 103 lb 4.8 oz 99 lb 13.9 oz 100 lb 1.4 oz  Weight (kg) 46.857 kg 45.3 kg 45.4 kg  Some encounter information is confidential and restricted. Go to Review Flowsheets activity to see all data.    There is no height or weight on file to calculate BMI.  General: not Well nourished,in no acute distress Head: Atraumatic, normal size  Eyes: PEERLA, EOMI  Neck: Supple, no JVD Endocrine: No thryomegaly Cardiac: Normal S1, S2; RRR; positive for murmurs, no rubs, or gallops Lungs: Clear to auscultation bilaterally, no wheezing, rhonchi or rales  Abd: Soft, nontender, no hepatomegaly  Ext: No edema, pulses very weak bilat Musculoskeletal:[name] sleeping in bed in a Fetal position trying to sleep.  Neuro: somnolent but arousable, can move her extremities, with no focal weakness.    EKG:  The EKG was personally reviewed and demonstrates:  AF with RVR with ST-T wave changes over the inferolateral leads. When converted in NSR, the ST segment changes were better.   Relevant CV Studies: Echocardiogram 03/23/2018: - Left ventricle: The cavity size was normal. There was moderate  concentric hypertrophy. Systolic function was normal. The  estimated ejection fraction was in the range of 60% to 65%. Wall  motion was normal; there were no regional wall motion  abnormalities. There was an increased relative contribution of  atrial contraction to ventricular filling. Doppler parameters are  consistent with abnormal left ventricular relaxation (grade 1  diastolic dysfunction).  - Aortic valve: Moderate focal calcification involving the left  coronary cusp.  - Pulmonary arteries: Systolic pressure could not be accurately   estimated.  - Pericardium, extracardiac: A small, free-flowing pericardial  effusion was identified circumferential to the heart. The fluid  had no internal echoes.There was no evidence of hemodynamic  compromise.   Laboratory Data: High Sensitivity Troponin:  Recent Labs  Lab 10/06/20 1728 10/06/20 1947 10/06/20 2337  TROPONINIHS 80* 210* 681*     Cardiac EnzymesNo results for input(s): TROPONINI in the last 168 hours. No results for input(s): TROPIPOC in the last 168 hours.  Chemistry Recent Labs  Lab 10/06/20 1726 10/06/20 1728  NA 137 141  K 4.5 4.0  CL 95* 90*  CO2  --  25  GLUCOSE 176* 186*  BUN 29* 20  CREATININE 4.60* 4.75*  CALCIUM  --  9.1  GFRNONAA  --  10*  ANIONGAP  --  26*    Recent Labs  Lab 10/06/20 1728  PROT 8.3*  ALBUMIN 4.0  AST 18  ALT 12  ALKPHOS 61  BILITOT 0.3   Hematology Recent Labs  Lab 10/06/20 1726 10/06/20 1728  WBC  --  8.2  RBC  --  5.04  HGB 16.3* 14.7  HCT 48.0* 46.8*  MCV  --  92.9  MCH  --  29.2  MCHC  --  31.4  RDW  --  15.0  PLT  --  170   BNPNo results for input(s): BNP, PROBNP in the last 168 hours.  DDimer No results for input(s): DDIMER in the last 168 hours.  Radiology/Studies:  CT Angio Head W/Cm &/Or Wo Cm  Result Date: 10/06/2020 CLINICAL DATA:  Weakness.  Acute stroke presentation. EXAM: CT ANGIOGRAPHY HEAD AND NECK TECHNIQUE: Multidetector CT imaging of the head and neck was performed using the standard protocol during bolus administration of intravenous contrast. Multiplanar CT image reconstructions and MIPs were obtained to evaluate the vascular anatomy. Carotid stenosis measurements (when applicable) are obtained utilizing NASCET criteria, using the distal internal carotid diameter as the denominator. CONTRAST:  40mL OMNIPAQUE IOHEXOL 350 MG/ML SOLN COMPARISON:  Head CT same day. FINDINGS: CTA NECK FINDINGS Aortic arch: Aortic atherosclerosis. No flow limiting stenosis of the brachiocephalic  vessel origins. Right carotid system: Common carotid artery shows atherosclerotic calcification but is patent to the bifurcation. Calcified plaque at the carotid bifurcation and ICA bulb. Minimal diameter of the distal bulb is 4 mm. Compared to a more distal cervical ICA diameter of 4 mm, there is no stenosis. Left carotid system: Common carotid artery shows scattered atherosclerotic plaque but is patent to the bifurcation. Calcified plaque at the carotid bifurcation and ICA bulb. Minimal diameter in the ICA bulb measures 2 mm. Compared to a more distal cervical ICA diameter of 4 mm, this indicates a 50% stenosis. Vertebral arteries: Calcified plaque at both vertebral artery origins. Estimated 50% stenosis at both vertebral artery origins. Other areas of calcified plaque within both vertebral arteries present additional stenoses, more severe on the right than the left. Both vessels do remain patent through the cervical region to the foramen magnum. Skeleton: Ordinary cervical spondylosis. Other neck: Enlarged heterogeneous thyroid gland. Dominant nodule inferior left measuring at least 3 cm in diameter. Upper chest: No active disease. Review of the MIP images confirms the above findings CTA HEAD FINDINGS Anterior circulation: Both internal carotid arteries are patent through the skull base and siphon regions. Extensive siphon atherosclerotic calcification with stenosis estimated at 50% on both sides. Aneurysm clip adjacent to the supraclinoid ICA on the right. No acute large or medium vessel occlusion identified. Second aneurysm clip at the right MCA bifurcation region. Posterior circulation: Both vertebral arteries are patent through the foramen magnum. 50% stenosis of the right vertebral artery V4 segment. Both vertebral arteries reach the basilar. Basilar artery shows some atherosclerotic narrowing and irregularity without a  severe stenosis. 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 No sign of recurrent  flow or coil compaction. Venous sinuses: Patent and normal. Anatomic variants: None other significant. Review of the MIP images confirms the above findings IMPRESSION: 1. No acute large or medium vessel occlusion. 2. Aortic atherosclerosis. 3. Atherosclerotic disease at both carotid bifurcations. No measurable stenosis on the right. 50% stenosis of the ICA bulb on the left. 4. 50% stenosis at both vertebral artery origins. Additional stenoses along the cervical course of both vertebral arteries. 50% stenosis of the right vertebral artery V4 segment. 5. 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 No sign of recurrent flow or coil compaction. 6. Aneurysm clips at the right MCA bifurcation and supraclinoid ICA region. No sign of residual aneurysmal flow. 7. 50% stenosis of both carotid siphon regions. 8. Enlarged heterogeneous thyroid gland with dominant nodule on the left measuring at least 3 cm in diameter. Recommend thyroid US (ref: J Am Coll Radiol. 2015 Feb;12(2): 143-50). Aortic Atherosclerosis (ICD10-I70.0). Electronically Signed   By: Nelson Chimes M.D.   On: 10/06/2020 22:03   DG Chest 2 View  Result Date: 10/06/2020 CLINICAL DATA:  Altered level of consciousness EXAM: CHEST - 2 VIEW COMPARISON:  02/02/2019 FINDINGS: Frontal and lateral views of the chest demonstrates stable ventriculostomy catheter within the right anterior chest. Cardiac silhouette is stable. No acute airspace disease, effusion, or pneumothorax. No acute bony abnormalities. IMPRESSION: 1. Stable chest, no acute process. Electronically Signed   By: Randa Ngo M.D.   On: 10/06/2020 23:30   CT Angio Neck W and/or Wo Contrast  Result Date: 10/06/2020 CLINICAL DATA:  Weakness.  Acute stroke presentation. EXAM: CT ANGIOGRAPHY HEAD AND NECK TECHNIQUE: Multidetector CT imaging of the head and neck was performed using the standard protocol during bolus administration of intravenous contrast. Multiplanar CT image reconstructions  and MIPs were obtained to evaluate the vascular anatomy. Carotid stenosis measurements (when applicable) are obtained utilizing NASCET criteria, using the distal internal carotid diameter as the denominator. CONTRAST:  63mL OMNIPAQUE IOHEXOL 350 MG/ML SOLN COMPARISON:  Head CT same day. FINDINGS: CTA NECK FINDINGS Aortic arch: Aortic atherosclerosis. No flow limiting stenosis of the brachiocephalic vessel origins. Right carotid system: Common carotid artery shows atherosclerotic calcification but is patent to the bifurcation. Calcified plaque at the carotid bifurcation and ICA bulb. Minimal diameter of the distal bulb is 4 mm. Compared to a more distal cervical ICA diameter of 4 mm, there is no stenosis. Left carotid system: Common carotid artery shows scattered atherosclerotic plaque but is patent to the bifurcation. Calcified plaque at the carotid bifurcation and ICA bulb. Minimal diameter in the ICA bulb measures 2 mm. Compared to a more distal cervical ICA diameter of 4 mm, this indicates a 50% stenosis. Vertebral arteries: Calcified plaque at both vertebral artery origins. Estimated 50% stenosis at both vertebral artery origins. Other areas of calcified plaque within both vertebral arteries present additional stenoses, more severe on the right than the left. Both vessels do remain patent through the cervical region to the foramen magnum. Skeleton: Ordinary cervical spondylosis. Other neck: Enlarged heterogeneous thyroid gland. Dominant nodule inferior left measuring at least 3 cm in diameter. Upper chest: No active disease. Review of the MIP images confirms the above findings CTA HEAD FINDINGS Anterior circulation: Both internal carotid arteries are patent through the skull base and siphon regions. Extensive siphon atherosclerotic calcification with stenosis estimated at 50% on both sides. Aneurysm clip adjacent to the supraclinoid ICA  on the right. No acute large or medium vessel occlusion identified. Second  aneurysm clip at the right MCA bifurcation region. Posterior circulation: Both vertebral arteries are patent through the foramen magnum. 50% stenosis of the right vertebral artery V4 segment. Both vertebral arteries reach the basilar. Basilar artery shows some atherosclerotic narrowing and irregularity without a severe stenosis. 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 No sign of recurrent flow or coil compaction. Venous sinuses: Patent and normal. Anatomic variants: None other significant. Review of the MIP images confirms the above findings IMPRESSION: 1. No acute large or medium vessel occlusion. 2. Aortic atherosclerosis. 3. Atherosclerotic disease at both carotid bifurcations. No measurable stenosis on the right. 50% stenosis of the ICA bulb on the left. 4. 50% stenosis at both vertebral artery origins. Additional stenoses along the cervical course of both vertebral arteries. 50% stenosis of the right vertebral artery V4 segment. 5. 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 No sign of recurrent flow or coil compaction. 6. Aneurysm clips at the right MCA bifurcation and supraclinoid ICA region. No sign of residual aneurysmal flow. 7. 50% stenosis of both carotid siphon regions. 8. Enlarged heterogeneous thyroid gland with dominant nodule on the left measuring at least 3 cm in diameter. Recommend thyroid US (ref: J Am Coll Radiol. 2015 Feb;12(2): 143-50). Aortic Atherosclerosis (ICD10-I70.0). Electronically Signed   By: Nelson Chimes M.D.   On: 10/06/2020 22:03   CT HEAD CODE STROKE WO CONTRAST  Result Date: 10/06/2020 CLINICAL DATA:  Code stroke.  Weakness EXAM: CT HEAD WITHOUT CONTRAST TECHNIQUE: Contiguous axial images were obtained from the base of the skull through the vertex without intravenous contrast. COMPARISON:  02/16/2018 FINDINGS: Brain: Old small vessel cerebellar infarctions as seen previously. Cerebral hemispheres show old infarction in the left parietal lobe which is  unchanged. Extensive chronic small-vessel ischemic changes of the white matter as seen previously. Old infarction in the left basal ganglia. Right VP shunt placed from a frontal approach enters the right lateral ventricle, extends through the foramen of [place]ro into the third ventricle and has its tip in the region of the left midbrain. 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 Previous aneurysm clip at the base of the brain on the right. No sign of acute infarction, hemorrhage, hydrocephalus or extra-axial collection. Vascular: There is atherosclerotic calcification of the major vessels at the base of the brain. Skull: Craniotomy on the right in the distant past. No acute finding. Sinuses/Orbits: Clear/normal Other: None IMPRESSION: 1. No acute finding by CT. Extensive chronic small-vessel ischemic changes throughout the brain as seen previously. 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 2. Right VP shunt in place. No hydrocephalus. 3. These results were communicated to [facility] at 5:43 pmon 12/18/2021by text page via the [facility] messaging system. Electronically Signed   By: Nelson Chimes M.D.   On: 10/06/2020 17:44    Assessment and Plan:  1. AF with RVR: [name] is back in Sinus rhythm. No anticoagulation secondary to a History of IC bleed. Continue with amiodarone and ASA, and add metoprolol 25 mg BID.  2. Positive troponin, most probably related to her rate and renal failure. Echocardiogram ordered. Further recommendation regarding invasive work up as per the cardiology team following.  3. Altered mental status, neurology following and work up is planned. 4. Coronary artery disease SP stent to RCA in the past. [name] will be continued on her ASA. No reported chest pain. Further cardiac work up depending on the [name]  response to therapy, her echocardiogram results, and will be dictated by the following cardiology team.  5. Hypertension, continue to follow and monitor 6. Hyperlipidemia,  start on low dose statins.  7. ESRD on HD, nephrology will be following      For questions or updates, please contact [place] Please consult [email or web address] for contact info under     Time Spent with [name]: I have spent a total of 60 minutes with [name] reviewing hospital notes, telemetry, EKGs, labs and examining the [name] as well as establishing an assessment and plan that was discussed with the [name].  > 50% of time was spent in direct [name] care.  Signed, Dorthey Sawyer, [place]  10/07/2020 12:44 AM

## 2020-10-08 DIAGNOSIS — I10 Essential (primary) hypertension: Secondary | ICD-10-CM

## 2020-10-08 DIAGNOSIS — N186 End stage renal disease: Secondary | ICD-10-CM

## 2020-10-08 DIAGNOSIS — E782 Mixed hyperlipidemia: Secondary | ICD-10-CM

## 2020-10-08 LAB — GLUCOSE, CAPILLARY
Glucose-Capillary: 74 mg/dL (ref 70–99)
Glucose-Capillary: 110 mg/dL — ABNORMAL HIGH (ref 70–99)
Glucose-Capillary: 100 mg/dL — ABNORMAL HIGH (ref 70–99)
Glucose-Capillary: 107 mg/dL — ABNORMAL HIGH (ref 70–99)
Glucose-Capillary: 124 mg/dL — ABNORMAL HIGH (ref 70–99)

## 2020-10-08 LAB — HEPARIN LEVEL (UNFRACTIONATED)
Heparin Unfractionated: 0.48 IU/mL (ref 0.30–0.70)
Heparin Unfractionated: 0.44 IU/mL (ref 0.30–0.70)

## 2020-10-08 MED ORDER — CHLORHEXIDINE GLUCONATE CLOTH 2 % EX PADS
6.0000 | MEDICATED_PAD | Freq: Every day | CUTANEOUS | Status: DC
  Administered 2020-10-08 – 2020-10-17 (×9): 6 via TOPICAL

## 2020-10-08 MED ORDER — PENTAFLUOROPROP-TETRAFLUOROETH EX AERO: 1.0000 "application " | INHALATION_SPRAY | CUTANEOUS | Status: DC | PRN

## 2020-10-08 MED ORDER — LIDOCAINE HCL (PF) 1 % IJ SOLN: 5.0000 mL | INTRAMUSCULAR | Status: DC | PRN

## 2020-10-08 MED ORDER — LIDOCAINE-PRILOCAINE 2.5-2.5 % EX CREA
1.0000 "application " | TOPICAL_CREAM | CUTANEOUS | Status: DC | PRN
  Filled 2020-10-08: qty 5

## 2020-10-08 MED ORDER — SODIUM CHLORIDE 0.9 % IV SOLN: 100.0000 mL | INTRAVENOUS | Status: DC | PRN

## 2020-10-08 NOTE — Plan of Care (Signed)

## 2020-10-08 NOTE — Evaluation (Signed)
Physical Therapy Evaluation Patient Details Name: [name] MRN: [id number] DOB: Mar 09, 1961 Today's Date: 10/08/2020   History of Present Illness  This 59 y.o. female with PMH significant for ESRD on HD, T2DM, HLD, CAD with stents, chronic diastolic CHF, A. 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  Patient was brought to the ED on 12/18 from dialysis center for evaluation of decreased responsiveness.  Pt admitted with acute encephalopathy, afib with RVR, and elevated troponin/NSTEMI with cardio consulted.  Clinical Impression  Pt admitted with above diagnosis. Pt with limited ability to provide PLOF/home environment, but per prior admission has 24 hr support.  Today, she required min A, cues for safety, and cues for sequencing/initiation.  Suspect that pt is near baseline as she was able to ambulate in hall with RW and min Assist.  Pt currently with functional limitations due to the deficits listed below (see PT Problem List). Pt will benefit from skilled PT to increase their independence and safety with mobility to allow discharge to the venue listed below.       Follow Up Recommendations Supervision/Assistance - 24 hour;Home health PT    Equipment Recommendations  None recommended by PT    Recommendations for Other Services       Precautions / Restrictions Precautions Precautions: Fall      Mobility  Bed Mobility Overal bed mobility: Needs Assistance Bed Mobility: Supine to Sit     Supine to sit: Min guard     General bed mobility comments: Cues for sequence and to complete transfer    Transfers Overall transfer level: Needs assistance Equipment used: Rolling walker (2 wheeled) Transfers: Sit to/from Stand Sit to Stand: Min assist         General transfer comment: min A to steady  Ambulation/Gait Ambulation/Gait assistance: Min Web designer (Feet): 150 Feet Assistive device:  Rolling walker (2 wheeled) Gait Pattern/deviations: Step-through pattern;Decreased stride length;Trunk flexed Gait velocity: decreased   General Gait Details: frequent cues and assist to maintain appropriate RW proximity  Stairs            Wheelchair Mobility    Modified Rankin (Stroke Patients Only)       Balance Overall balance assessment: Needs assistance Sitting-balance support: No upper extremity supported Sitting balance-Leahy Scale: Good     Standing balance support: Bilateral upper extremity supported Standing balance-Leahy Scale: Poor Standing balance comment: required RW or HHA                             Pertinent Vitals/Pain Pain Assessment: No/denies pain    Home Living Family/patient expects to be discharged to:: Private residence Living Arrangements: Children (daughter) Available Help at Discharge: Family;Available 24 hours/day Type of Home: House Home Access: Level entry     Home Layout: One level Home Equipment: Walker - 2 wheels Additional Comments: Pt confused and unable to provide PLOF.  PLOF obtained from prior admission.    Prior Function Level of Independence: Needs assistance         Comments: Per prior admission has 24/7 supervision, Independent with ADLs but not IADLs, pt reports uses RW     Hand Dominance        Extremity/Trunk Assessment   Upper Extremity Assessment Upper Extremity Assessment: Defer to OT evaluation    Lower Extremity Assessment Lower Extremity Assessment: Generalized weakness    Cervical / Trunk Assessment Cervical / Trunk Assessment:  Normal  Communication   Communication: Expressive difficulties;Other (comment) (slow to respond at times)  Cognition Arousal/Alertness: Awake/alert Behavior During Therapy: WFL for tasks assessed/performed Overall Cognitive Status: No family/caregiver present to determine baseline cognitive functioning Area of Impairment: Orientation;Following  commands;Safety/judgement;Awareness;Problem solving                 Orientation Level: Disoriented to;Place;Time;Situation     Following Commands: Follows one step commands inconsistently Safety/Judgement: Decreased awareness of safety;Decreased awareness of deficits   Problem Solving: Slow processing;Decreased initiation;Requires verbal cues;Requires tactile cues General Comments: Slow to respond/no response at times      General Comments General comments (skin integrity, edema, etc.): VSS    Exercises     Assessment/Plan    PT Assessment Patient needs continued PT services  PT Problem List Decreased strength;Decreased mobility;Decreased safety awareness;Decreased coordination;Decreased activity tolerance;Decreased balance;Decreased knowledge of use of DME       PT Treatment Interventions DME instruction;Therapeutic activities;Gait training;Therapeutic exercise;Patient/family education;Stair training;Functional mobility training;Balance training;Cognitive remediation    PT Goals (Current goals can be found in the Care Plan section)  Acute Rehab PT Goals Patient Stated Goal: unable to stated PT Goal Formulation: With patient Time For Goal Achievement: 10/22/20 Potential to Achieve Goals: Good    Frequency Min 3X/week   Barriers to discharge        Co-evaluation PT/OT/SLP Co-Evaluation/Treatment: Yes Reason for Co-Treatment: Complexity of the patient's impairments (multi-system involvement);For patient/therapist safety PT goals addressed during session: Mobility/safety with mobility OT goals addressed during session: ADL's and self-care       AM-PAC PT "6 Clicks" Mobility  Outcome Measure Help needed turning from your back to your side while in a flat bed without using bedrails?: A Little Help needed moving from lying on your back to sitting on the side of a flat bed without using bedrails?: A Little Help needed moving to and from a bed to a chair (including  a wheelchair)?: A Little Help needed standing up from a chair using your arms (e.g., wheelchair or bedside chair)?: A Little Help needed to walk in hospital room?: A Little Help needed climbing 3-5 steps with a railing? : A Little 6 Click Score: 18    End of Session Equipment Utilized During Treatment: Gait belt Activity Tolerance: Patient tolerated treatment well Patient left: with chair alarm set;in chair;with call bell/phone within reach Nurse Communication: Mobility status PT Visit Diagnosis: Unsteadiness on feet (R26.81)    Time: 6294-7654 PT Time Calculation (min) (ACUTE ONLY): 24 min   Charges:   PT Evaluation $PT Eval Moderate Complexity: 1 Mod          Fitzroy Mikami, PT Acute Rehab Services Pager [phone] [facility] [phone]    Karlton Lemon 10/08/2020, 2:47 PM

## 2020-10-08 NOTE — Progress Notes (Signed)
ANTICOAGULATION CONSULT NOTE - Initial Consult  Pharmacy Consult for Heparin  Indication: chest pain/ACS, also has hx afib  Allergies  Allergen Reactions  . Ampicillin Hives and Rash    Has patient had a PCN reaction causing immediate rash, facial/tongue/throat swelling, SOB or lightheadedness with hypotension: No Has patient had a PCN reaction causing severe rash involving mucus membranes or skin necrosis: No Has patient had a PCN reaction that required hospitalization: No Has patient had a PCN reaction occurring within the last 10 years: No If all of the above answers are "NO", then may proceed with Cephalosporin use.   Marland Kitchen Penicillins Hives and Rash    Has patient had a PCN reaction causing immediate rash, facial/tongue/throat swelling, SOB or lightheadedness with hypotension: Yes Has patient had a PCN reaction causing severe rash involving mucus membranes or skin necrosis: No Has patient had a PCN reaction that required hospitalization: No Has patient had a PCN reaction occurring within the last 10 years: No If all of the above answers are "NO", then may proceed with Cephalosporin use.      Vital Signs: Temp: 98.1 F (36.7 C) (12/20 1128) Temp Source: Oral (12/20 1128) BP: 133/67 (12/20 1128) Pulse Rate: 66 (12/20 1128)  Labs: Recent Labs    10/06/20 1726 10/06/20 1728 10/06/20 1947 10/06/20 2337 10/07/20 0205 10/07/20 0826 10/07/20 1707 10/08/20 0233 10/08/20 1043  HGB 16.3* 14.7  --   --   --   --   --   --   --   HCT 48.0* 46.8*  --   --   --   --   --   --   --   PLT  --  170  --   --   --   --   --   --   --   APTT  --  26  --   --   --   --   --   --   --   LABPROT  --  12.8  --   --   --   --   --   --   --   INR  --  1.0  --   --   --   --   --   --   --   HEPARINUNFRC  --   --   --   --   --   --  0.62 0.48 0.44  CREATININE 4.60* 4.75*  --   --   --   --   --   --   --   TROPONINIHS  --  80*   < > 681* 1,127* 1,480*  --   --   --    < > = values in this  interval not displayed.    CrCl cannot be calculated (Unknown ideal weight.).   Medical History: Past Medical History:  Diagnosis Date  . Anterior cerebral artery aneurysm    1996 AT [facility]  . Coronary atherosclerosis of native coronary artery    STEMI Feb 2009 - 70% distal LAD lesion c/w plaque rupture, DES distal RCA  . Diastolic heart failure    LVEF 60-65% with grade 2 diastolic dysfunction - July 2014  . End-stage renal disease on hemodialysis ([place]) 02/03/2019  . ESRD (end stage renal disease) on dialysis ([place])   . HTN (hypertension)    Resistant  . Hypercholesterolemia   . Subarachnoid hemorrhage ([place])    03/2012  . Tobacco abuse    Resumed  smoking half a pack a day. She was a smoker in the past and states she was abl eto stop in the past using nicotine patches  . Unspecified atrial fibrillation ([place]) 02/03/2019    Assessment: 59 y/o M with altered mental status. Troponin is rising [phone]). Also has hx of afib on no anti-coagulation PTA. Requested to start heparin drip. Dr. Marlowe Sax cleared starting heparin drip with neurology given history of SAH and hx of aneurysm with clips. CBC good. ESRD.   Confirmatory heparin level 0.44 this AM.   Goal of Therapy:  Heparin level 0.3-0.5 units/ml Monitor platelets by anticoagulation protocol: Yes   Plan:  - Will continue heparin drip at 600 units/h  - Recheck heparin level daily - Monitor for s/s of bleeding and Daily CBC/HL  Delmont Prosch A. Levada Dy, PharmD, BCPS, FNKF Clinical Pharmacist [place] Please utilize Amion for appropriate phone number to reach the unit pharmacist ([place])

## 2020-10-08 NOTE — Evaluation (Signed)
Speech Language Pathology Evaluation Patient Details Name: [name] MRN: [id number] DOB: [birth year] Today's Date: 10/08/2020 Time: 3662-9476 SLP Time Calculation (min) (ACUTE ONLY): 22 min  Problem List:  Patient Active Problem List   Diagnosis Date Noted  . AMS (altered mental status) 10/06/2020  . Elevated troponin 10/06/2020  . Headache, unspecified 11/28/2019  . Hypercalcemia 11/10/2019  . Allergy, unspecified, initial encounter 08/08/2019  . Hypokalemia 07/07/2019  . Sepsis, unspecified organism ([place]) 02/03/2019  . End-stage renal disease on hemodialysis ([place]) 02/03/2019  . Unspecified atrial fibrillation ([place]) 02/03/2019  . Hypotension 02/02/2019  . Syncope and collapse   . Palliative care encounter   . Atrial fibrillation with rapid ventricular response ([place]) 03/20/2018  . Chills (without fever) 03/16/2018  . Iron deficiency anemia, unspecified 02/04/2018  . 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 Anemia in chronic kidney disease 12/30/2017  . Fever, unspecified 12/30/2017  . Generalized abdominal pain 12/30/2017  . Heart failure, unspecified ([place]) 12/30/2017  . Other specified personal risk factors, not elsewhere classified 12/30/2017  . Pure hypercholesterolemia, unspecified 12/30/2017  . Secondary hyperparathyroidism of renal origin ([place]) 12/30/2017  . Shortness of breath 12/30/2017  . History of embolic stroke 54/65/0354  . Altered mental status 11/08/2013  . HTN (hypertension) 11/08/2013  . Hypertensive renal disease 11/06/2013  . Protein-calorie malnutrition, severe ([place]) 11/04/2013  . Seizure disorder ([place]) 10/22/2013  . Hypertensive urgency 10/20/2013  . Hypertensive heart disease 05/07/2013  . Acute-on-chronic renal failure ([place]) 05/07/2013  . HTN (hypertension), malignant 05/06/2013  . CAD (coronary artery disease) 05/06/2013  . ESRD (end stage renal disease) ([place]) 05/06/2013  . Chronic diastolic heart failure ([place])   . Tobacco abuse 09/25/2012  .  H/O noncompliance with medical treatment, presenting hazards to health   . Renal artery stenosis ([place]) 04/05/2012  . History of subarachnoid hemorrhage 03/03/2012  . Mixed hyperlipidemia 09/19/2008  . Acute on chronic diastolic heart failure ([place]) 09/19/2008   Past Medical History:  Past Medical History:  Diagnosis Date  . Anterior cerebral artery aneurysm    1996 AT [facility]  . Coronary atherosclerosis of native coronary artery    STEMI Feb 2009 - 70% distal LAD lesion c/w plaque rupture, DES distal RCA  . Diastolic heart failure    LVEF 60-65% with grade 2 diastolic dysfunction - July 2014  . End-stage renal disease on hemodialysis ([place]) 02/03/2019  . ESRD (end stage renal disease) on dialysis ([place])   . HTN (hypertension)    Resistant  . Hypercholesterolemia   . Subarachnoid hemorrhage ([place])    03/2012  . Tobacco abuse    Resumed smoking half a pack a day. She was a smoker in the past and states she was abl eto stop in the past using nicotine patches  . Unspecified atrial fibrillation ([place]) 02/03/2019   Past Surgical History:  Past Surgical History:  Procedure Laterality Date  . ABDOMINAL HYSTERECTOMY    . BASCILIC VEIN TRANSPOSITION Right 05/13/2013   Procedure: [place];  Surgeon: Rosetta Posner, MD;  Location: [place];  Service: Vascular;  Laterality: Right;  . BRAIN SURGERY     2   . LOOP RECORDER IMPLANT N/A 11/14/2013   Procedure: LOOP RECORDER IMPLANT;  Surgeon: Evans Lance, MD;  [facility] C[facility] LAB;  Service: Cardiovascular;  Laterality: N/A;  . TEE WITHOUT CARDIOVERSION N/A 11/14/2013   Procedure: TRANSESOPHAGEAL ECHOCARDIOGRAM (TEE);  Surgeon: Candee Furbish, MD;  Location: [facility] ENDOSCOPY;  Service: Cardiovascular;  Laterality: N/A;  . VENTRICULOPERITONEAL SHUNT  03/27/2012  Procedure: SHUNT INSERTION VENTRICULAR-PERITONEAL;  Surgeon: Winfield Cunas, MD;  Location: [place] NEURO ORS;  Service: Neurosurgery;  Laterality: N/A;  Ventricular-Peritoneal Shunt Insertion    HPI:  59 y.o. female with medical history significant of ESRD on HD, hypertension, stroke, ruptured cerebral aneurysm status post coiling and shunt, seizure disorder, type 2 diabetes, CAD with stents, chronic diastolic CHF, hyperlipidemia, A. fib being brought to the ED via EMS from her dialysis center for evaluation of decreased responsiveness, not following commands or speaking after her treatment CT head on 10/06/20 indicated No acute finding by CT. Extensive chronic small-vessel ischemic changes throughout the brain as seen previously. Old left parietal and left basal ganglia infarctions; SLE generated d/t above symptoms at admission.  Assessment / Plan / Recommendation Clinical Impression  SLUMS ([place] Mental Status Examination) administered partially d/t pt's inability to complete because of baseline cognitive/linguistic deficits.  Pt oriented to self and/or unable to state words required for other orientation tasks.  Memory, attention and organization tasks decreased during assessment despite visual/verbal cueing provided by SLP for sentence completion and phonemic/semantic cues provided.  Pt able to follow simple 1-step commands, but breakdown occurred when 2+ step commands attempted.  Pt's expressive aphasia impacted continuation of SLUMS assessment and provided an inaccurate view of pt's ability.  Pt could repeat numbers up to 3 digits and words/simple phrases before aphasia impacted performance.  Pt is functioning at baseline level for cognition and language at this time.  Thank you for this consult. ST will s/o in acute setting.    SLP Assessment  SLP Recommendation/Assessment: Patient does not need any further Speech Language Pathology Services SLP Visit Diagnosis: Aphasia (R47.01)    Follow Up Recommendations  None    Frequency and Duration     Evaluation only      SLP Evaluation Cognition  Overall Cognitive Status: No family/caregiver present to determine  baseline cognitive functioning Arousal/Alertness: Awake/alert Orientation Level: Oriented to person Behaviors: Perseveration       Comprehension  Auditory Comprehension Overall Auditory Comprehension: Impaired Yes/No Questions: Impaired Basic Biographical Questions: 0-25% accurate Basic Immediate Environment Questions: 0-24% accurate Commands: Impaired Two Step Basic Commands: 25-49% accurate Conversation: Simple EffectiveTechniques: Repetition;Extra processing time Visual Recognition/Discrimination Discrimination: Not tested Reading Comprehension Reading Status: Not tested    Expression Expression Primary Mode of Expression: Verbal Verbal Expression Overall Verbal Expression: Impaired Initiation: Impaired Level of Generative/Spontaneous Verbalization: Word;Phrase Repetition: Impaired Level of Impairment: Phrase level Naming: Impairment Responsive: 26-50% accurate Confrontation: Impaired Convergent: 0-24% accurate Divergent: 0-24% accurate Verbal Errors: Perseveration Non-Verbal Means of Communication: Not applicable Written Expression Dominant Hand: Right Written Expression: Unable to assess (comment) (partially assessed; DTA d/t decreased aud comp)   Oral / Motor  Oral Motor/Sensory Function Overall Oral Motor/Sensory Function: Other (comment) (DTA, but grossly WFL) Motor Speech Overall Motor Speech: Other (comment) (DTA) Respiration: Within functional limits Phonation: Normal Resonance: Within functional limits Articulation: Within functional limitis Intelligibility: Intelligible Motor Planning: Not tested Motor Speech Errors: Not applicable                      Elvina Sidle, M.S., CCC-SLP 10/08/2020, 4:59 PM

## 2020-10-08 NOTE — Progress Notes (Signed)
PROGRESS NOTE    [name]  ZJI:[id number] DOB: 01/18/1961 DOA: 10/06/2020 PCP: Garwin Brothers, MD   Brief Narrative: This 59 y.o. female with PMH significant for ESRD on HD, T2DM, HLD, CAD with stents, chronic diastolic CHF, A. 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 Patient was brought to the ED on 12/18 from dialysis center for evaluation of decreased responsiveness, not following commands or speaking after her treatment On arrival to the ED, code stroke was activated.  Patient was seen by neurology, symptoms has resolved.  Initial CT head negative for acute abnormalities.  CTA head and neck was negative for acute large or medium vessel occlusion.  Patient was in A. fib with RVR 150, She converted into NSR after dose of IV Cardizem and the dose of amiodarone. Patient was admitted for altered mental status.   Assessment & Plan:   Principal Problem:   AMS (altered mental status) Active Problems:   Mixed hyperlipidemia   ESRD (end stage renal disease) (HCC)   HTN (hypertension)   Atrial fibrillation with rapid ventricular response (HCC)   Elevated troponin   Acute encephalopathy: >>>> Improved. -Patient presented with altered mental status after hemodialysis. -In the setting of dialysis, atherosclerosis of intracranial vessels. -No evidence of infection. Most likely related to be received in fluid electrolytes during dialysis. -Neurology consultation appreciated. Work-up started to rule out CVA. -Head CT negative for acute finding. -CT angio of head and neck did not show any acute large or medium vessel occlusion. -Neurology consultation appreciated. -Ongoing stroke work-up including TTE, A1c, lipid panel, TSH  - Hb A1c 5.2, LDL 144, ammonia 18, UDS negative. -Currently on aspirin 81 mg daily. -Continue to monitor in telemetry. -Ammonia level normal, EEG negative for any seizure activity. -  PT/OT/ST eval.   A. fib with RVR:  -Initially presented with A. fib with RVR up to 150s. Converted to normal sinus  with IV Cardizem 10 mg and a dose of amiodarone from home.  -Cardiology consultation appreciated. Added metoprolol 25 mg twice daily.  Elevated troponin could be due to non-STEMI History of CAD with stents. -Found to have elevated troponin which are trending up. -Has history of subarachnoid hemorrhage from an aneurysm in 2013 status post coiling.  -Admitting physician discussed with neurology and got an approval to initiate heparin drip for NSTEMI -Cardiology consulted, recommended anticoagulation for 48 hours. -May not be a candidate for long-term anticoagulation given history of ICH.   ESRD-HD-TTS -Patient was at the dialysis center on 11/16 but she could not complete the dialysis because of her symptoms.  -Nephrology consulted,  she is going to have hemodialysis tomorrow as per schedule.  Chronic diastolic CHF Hypertension -Blood pressure in 140s. -Not on diuretics or any blood pressure medicines at home. -Continue monitor.  IV hydralazine as needed.  Ruptured cerebral aneurysm status post coiling and shunt:  Stable findings on CT. -No indication for acute intervention  Seizure disorder -Resume Keppra   Diet-controlled type 2 diabetes -A1c 5.2 on 2/19. -Currently on sliding scale insulin with Accu-Cheks.      Thyroid nodule: CT showing enlarged heterogenous thyroid gland with dominant nodule on the left measuring at least 3 cm in diameter. -Thyroid function tests and ultrasound ordered for further evaluation.    DVT prophylaxis: Heparin drip Code Status: Full code Family Communication: No family at bedside Disposition Plan:   Status is: Inpatient  Remains inpatient appropriate because:Inpatient level of care appropriate due to  severity of illness   Dispo: The patient is from: Home              Anticipated d/c is to: Home               Anticipated d/c date is: 2 days              Patient currently is not medically stable to d/c.   Consultants:   Cardiology, nephrology  Procedures:  Antimicrobials:  Anti-infectives (From admission, onward)   None      Subjective: Patient was seen and examined at bedside.  Overnight events noted.   She looks older than her age.  Not in any distress.  She was able to answer questions.  Objective: Vitals:   10/08/20 0401 10/08/20 0504 10/08/20 0807 10/08/20 1128  BP: (!) 169/70  131/63 133/67  Pulse: 73  69 66  Resp: 16  16 18   Temp: 98.4 F (36.9 C)  98 F (36.7 C) 98.1 F (36.7 C)  TempSrc: Oral  Oral Oral  SpO2: 100%  100% 100%  Weight:  42.6 kg      Intake/Output Summary (Last 24 hours) at 10/08/2020 1354 Last data filed at 10/08/2020 1610 Gross per 24 hour  Intake 527.24 ml  Output --  Net 527.24 ml   Filed Weights   10/08/20 0504  Weight: 42.6 kg    Examination:  General exam: Appears calm and comfortable , not in any distress.  She appears older than her age. Respiratory system: Clear to auscultation. Respiratory effort normal. Cardiovascular system: S1 & S2 heard, RRR. No JVD, murmurs, rubs, gallops or clicks. No pedal edema. Gastrointestinal system: Abdomen is nondistended, soft and nontender. No organomegaly or masses felt. Normal bowel sounds heard. Central nervous system: Alert and oriented. No focal neurological deficits. Extremities: No edema, no cyanosis, no clubbing.  AV fistula noted in the right arm. Skin: No rashes, lesions or ulcers Psychiatry: Judgement and insight appear normal. Mood & affect appropriate.     Data Reviewed: I have personally reviewed following labs and imaging studies  CBC: Recent Labs  Lab 10/06/20 1726 10/06/20 1728  WBC  --  8.2  NEUTROABS  --  6.1  HGB 16.3* 14.7  HCT 48.0* 46.8*  MCV  --  92.9  PLT  --  960   Basic Metabolic Panel: Recent Labs  Lab 10/06/20 1726 10/06/20 1728  NA 137 141  K 4.5  4.0  CL 95* 90*  CO2  --  25  GLUCOSE 176* 186*  BUN 29* 20  CREATININE 4.60* 4.75*  CALCIUM  --  9.1   GFR: CrCl cannot be calculated (Unknown ideal weight.). Liver Function Tests: Recent Labs  Lab 10/06/20 1728  AST 18  ALT 12  ALKPHOS 61  BILITOT 0.3  PROT 8.3*  ALBUMIN 4.0   No results for input(s): LIPASE, AMYLASE in the last 168 hours. Recent Labs  Lab 10/07/20 0205  AMMONIA 18   Coagulation Profile: Recent Labs  Lab 10/06/20 1728  INR 1.0   Cardiac Enzymes: No results for input(s): CKTOTAL, CKMB, CKMBINDEX, TROPONINI in the last 168 hours. BNP (last 3 results) No results for input(s): PROBNP in the last 8760 hours. HbA1C: Recent Labs    10/07/20 0205  HGBA1C 5.2   CBG: Recent Labs  Lab 10/06/20 1721 10/07/20 2131 10/07/20 2156 10/08/20 0402 10/08/20 1126  GLUCAP 128* 68* 92 74 110*   Lipid Profile: Recent Labs    10/07/20 0205  CHOL  219*  HDL 59  LDLCALC 144*  TRIG 80  CHOLHDL 3.7   Thyroid Function Tests: Recent Labs    10/07/20 0205  TSH 0.539  FREET4 0.91   Anemia Panel: No results for input(s): VITAMINB12, FOLATE, FERRITIN, TIBC, IRON, RETICCTPCT in the last 72 hours. Sepsis Labs: No results for input(s): PROCALCITON, LATICACIDVEN in the last 168 hours.  Recent Results (from the past 240 hour(s))  Resp Panel by RT-PCR (Flu A&B, Covid) Nasopharyngeal Swab     Status: None   Collection Time: 10/06/20  5:25 PM   Specimen: Nasopharyngeal Swab; Nasopharyngeal(NP) swabs in vial transport medium  Result Value Ref Range Status   SARS Coronavirus 2 by RT PCR NEGATIVE NEGATIVE Final    Comment: (NOTE) SARS-CoV-2 target nucleic acids are NOT DETECTED.  The SARS-CoV-2 RNA is generally detectable in upper respiratory specimens during the acute phase of infection. The lowest concentration of SARS-CoV-2 viral copies this assay can detect is 138 copies/mL. A negative result does not preclude SARS-Cov-2 infection and should not be used  as the sole basis for treatment or other patient management decisions. A negative result may occur with  improper specimen collection/handling, submission of specimen other than nasopharyngeal swab, presence of viral mutation(s) within the areas targeted by this assay, and inadequate number of viral copies(<138 copies/mL). A negative result must be combined with clinical observations, patient history, and epidemiological information. The expected result is Negative.  Fact Sheet for Patients:  EntrepreneurPulse.com.au  Fact Sheet for Healthcare Providers:  IncredibleEmployment.be  This test is no t yet approved or cleared by the [place] FDA and  has been authorized for detection and/or diagnosis of SARS-CoV-2 by FDA under an Emergency Use Authorization (EUA). This EUA will remain  in effect (meaning this test can be used) for the duration of the COVID-19 declaration under Section 564(b)(1) of the Act, 21 U.S.C.section 360bbb-3(b)(1), unless the authorization is terminated  or revoked sooner.       Influenza A by PCR NEGATIVE NEGATIVE Final   Influenza B by PCR NEGATIVE NEGATIVE Final    Comment: (NOTE) The Xpert Xpress SARS-CoV-2/FLU/RSV plus assay is intended as an aid in the diagnosis of influenza from Nasopharyngeal swab specimens and should not be used as a sole basis for treatment. Nasal washings and aspirates are unacceptable for Xpert Xpress SARS-CoV-2/FLU/RSV testing.  Fact Sheet for Patients: EntrepreneurPulse.com.au  Fact Sheet for Healthcare Providers: IncredibleEmployment.be  This test is not yet approved or cleared by the [place] FDA and has been authorized for detection and/or diagnosis of SARS-CoV-2 by FDA under an Emergency Use Authorization (EUA). This EUA will remain in effect (meaning this test can be used) for the duration of the COVID-19 declaration under Section 564(b)(1)  of the Act, 21 U.S.C. section 360bbb-3(b)(1), unless the authorization is terminated or revoked.  Performed at [facility], [place] [address] [place], [place] [address]      Radiology Studies: CT Angio Head W/Cm &/Or Wo Cm  Result Date: 10/06/2020 CLINICAL DATA:  Weakness.  Acute stroke presentation. EXAM: CT ANGIOGRAPHY HEAD AND NECK TECHNIQUE: Multidetector CT imaging of the head and neck was performed using the standard protocol during bolus administration of intravenous contrast. Multiplanar CT image reconstructions and MIPs were obtained to evaluate the vascular anatomy. Carotid stenosis measurements (when applicable) are obtained utilizing NASCET criteria, using the distal internal carotid diameter as the denominator. CONTRAST:  28mL OMNIPAQUE IOHEXOL 350 MG/ML SOLN COMPARISON:  Head CT same day. FINDINGS: CTA NECK FINDINGS  Aortic arch: Aortic atherosclerosis. No flow limiting stenosis of the brachiocephalic vessel origins. Right carotid system: Common carotid artery shows atherosclerotic calcification but is patent to the bifurcation. Calcified plaque at the carotid bifurcation and ICA bulb. Minimal diameter of the distal bulb is 4 mm. Compared to a more distal cervical ICA diameter of 4 mm, there is no stenosis. Left carotid system: Common carotid artery shows scattered atherosclerotic plaque but is patent to the bifurcation. Calcified plaque at the carotid bifurcation and ICA bulb. Minimal diameter in the ICA bulb measures 2 mm. Compared to a more distal cervical ICA diameter of 4 mm, this indicates a 50% stenosis. Vertebral arteries: Calcified plaque at both vertebral artery origins. Estimated 50% stenosis at both vertebral artery origins. Other areas of calcified plaque within both vertebral arteries present additional stenoses, more severe on the right than the left. Both vessels do remain patent through the cervical region to the foramen magnum. Skeleton: Ordinary cervical spondylosis.  Other neck: Enlarged heterogeneous thyroid gland. Dominant nodule inferior left measuring at least 3 cm in diameter. Upper chest: No active disease. Review of the MIP images confirms the above findings CTA HEAD FINDINGS Anterior circulation: Both internal carotid arteries are patent through the skull base and siphon regions. Extensive siphon atherosclerotic calcification with stenosis estimated at 50% on both sides. Aneurysm clip adjacent to the supraclinoid ICA on the right. No acute large or medium vessel occlusion identified. Second aneurysm clip at the right MCA bifurcation region. Posterior circulation: Both vertebral arteries are patent through the foramen magnum. 50% stenosis of the right vertebral artery V4 segment. Both vertebral arteries reach the basilar. Basilar artery shows some atherosclerotic narrowing and irregularity without a severe stenosis. 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 No sign of recurrent flow or coil compaction. Venous sinuses: Patent and normal. Anatomic variants: None other significant. Review of the MIP images confirms the above findings IMPRESSION: 1. No acute large or medium vessel occlusion. 2. Aortic atherosclerosis. 3. Atherosclerotic disease at both carotid bifurcations. No measurable stenosis on the right. 50% stenosis of the ICA bulb on the left. 4. 50% stenosis at both vertebral artery origins. Additional stenoses along the cervical course of both vertebral arteries. 50% stenosis of the right vertebral artery V4 segment. 5. 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 No sign of recurrent flow or coil compaction. 6. Aneurysm clips at the right MCA bifurcation and supraclinoid ICA region. No sign of residual aneurysmal flow. 7. 50% stenosis of both carotid siphon regions. 8. Enlarged heterogeneous thyroid gland with dominant nodule on the left measuring at least 3 cm in diameter. Recommend thyroid US (ref: J Am Coll Radiol. 2015 Feb;12(2): 143-50). Aortic  Atherosclerosis (ICD10-I70.0). Electronically Signed   By: Nelson Chimes M.D.   On: 10/06/2020 22:03   DG Chest 2 View  Result Date: 10/06/2020 CLINICAL DATA:  Altered level of consciousness EXAM: CHEST - 2 VIEW COMPARISON:  02/02/2019 FINDINGS: Frontal and lateral views of the chest demonstrates stable ventriculostomy catheter within the right anterior chest. Cardiac silhouette is stable. No acute airspace disease, effusion, or pneumothorax. No acute bony abnormalities. IMPRESSION: 1. Stable chest, no acute process. Electronically Signed   By: Randa Ngo M.D.   On: 10/06/2020 23:30   CT Angio Neck W and/or Wo Contrast  Result Date: 10/06/2020 CLINICAL DATA:  Weakness.  Acute stroke presentation. EXAM: CT ANGIOGRAPHY HEAD AND NECK TECHNIQUE: Multidetector CT imaging of the head and neck was performed using the standard protocol  during bolus administration of intravenous contrast. Multiplanar CT image reconstructions and MIPs were obtained to evaluate the vascular anatomy. Carotid stenosis measurements (when applicable) are obtained utilizing NASCET criteria, using the distal internal carotid diameter as the denominator. CONTRAST:  57mL OMNIPAQUE IOHEXOL 350 MG/ML SOLN COMPARISON:  Head CT same day. FINDINGS: CTA NECK FINDINGS Aortic arch: Aortic atherosclerosis. No flow limiting stenosis of the brachiocephalic vessel origins. Right carotid system: Common carotid artery shows atherosclerotic calcification but is patent to the bifurcation. Calcified plaque at the carotid bifurcation and ICA bulb. Minimal diameter of the distal bulb is 4 mm. Compared to a more distal cervical ICA diameter of 4 mm, there is no stenosis. Left carotid system: Common carotid artery shows scattered atherosclerotic plaque but is patent to the bifurcation. Calcified plaque at the carotid bifurcation and ICA bulb. Minimal diameter in the ICA bulb measures 2 mm. Compared to a more distal cervical ICA diameter of 4 mm, this  indicates a 50% stenosis. Vertebral arteries: Calcified plaque at both vertebral artery origins. Estimated 50% stenosis at both vertebral artery origins. Other areas of calcified plaque within both vertebral arteries present additional stenoses, more severe on the right than the left. Both vessels do remain patent through the cervical region to the foramen magnum. Skeleton: Ordinary cervical spondylosis. Other neck: Enlarged heterogeneous thyroid gland. Dominant nodule inferior left measuring at least 3 cm in diameter. Upper chest: No active disease. Review of the MIP images confirms the above findings CTA HEAD FINDINGS Anterior circulation: Both internal carotid arteries are patent through the skull base and siphon regions. Extensive siphon atherosclerotic calcification with stenosis estimated at 50% on both sides. Aneurysm clip adjacent to the supraclinoid ICA on the right. No acute large or medium vessel occlusion identified. Second aneurysm clip at the right MCA bifurcation region. Posterior circulation: Both vertebral arteries are patent through the foramen magnum. 50% stenosis of the right vertebral artery V4 segment. Both vertebral arteries reach the basilar. Basilar artery shows some atherosclerotic narrowing and irregularity without a severe stenosis. 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 No sign of recurrent flow or coil compaction. Venous sinuses: Patent and normal. Anatomic variants: None other significant. Review of the MIP images confirms the above findings IMPRESSION: 1. No acute large or medium vessel occlusion. 2. Aortic atherosclerosis. 3. Atherosclerotic disease at both carotid bifurcations. No measurable stenosis on the right. 50% stenosis of the ICA bulb on the left. 4. 50% stenosis at both vertebral artery origins. Additional stenoses along the cervical course of both vertebral arteries. 50% stenosis of the right vertebral artery V4 segment. 5. Coil mass for treatment of a basilar  tip aneurysm. No sign of recurrent flow or coil compaction. 6. Aneurysm clips at the right MCA bifurcation and supraclinoid ICA region. No sign of residual aneurysmal flow. 7. 50% stenosis of both carotid siphon regions. 8. Enlarged heterogeneous thyroid gland with dominant nodule on the left measuring at least 3 cm in diameter. Recommend thyroid US (ref: J Am Coll Radiol. 2015 Feb;12(2): 143-50). Aortic Atherosclerosis (ICD10-I70.0). Electronically Signed   By: Nelson Chimes M.D.   On: 10/06/2020 22:03   EEG adult  Result Date: 10/07/2020 Lora Havens, MD     10/07/2020 11:06 AM Patient Name: [name] MRN: [id number] Epilepsy Attending: Lora Havens Referring Physician/Provider: Dr Derrick Ravel Date: 10/07/2020 Duration: 28.56 mins Patient history: 59yo M with ams. EEG to evaluate for seizure Level of alertness: Awake,  asleep AEDs during EEG study: None Technical aspects:  This EEG study was done with scalp electrodes positioned according to the 10-20 International system of electrode placement. Electrical activity was acquired at a sampling rate of 500Hz  and reviewed with a high frequency filter of 70Hz  and a low frequency filter of 1Hz . EEG data were recorded continuously and digitally stored. Description: The posterior dominant rhythm consists of 9 Hz activity of moderate voltage (25-35 uV) seen predominantly in posterior head regions, symmetric and reactive to eye opening and eye closing.  Sleep was characterized by vertex waves, sleep spindles (12 to 14 Hz), maximal frontocentral region. Hyperventilation and photic stimulation were not performed.   IMPRESSION: This study is within normal limits. No seizures or epileptiform discharges were seen throughout the recording. Lora Havens   ECHOCARDIOGRAM COMPLETE  Result Date: 10/07/2020    ECHOCARDIOGRAM REPORT   Patient Name:   [name] Date of Exam: 10/07/2020 Medical Rec #:  [id number]      Height:       61.0 in Accession #:     [id number]     Weight:       103.3 lb Date of Birth:  1961/01/18      BSA:          1.426 m Patient Age:    56 years       BP:           160/68 mmHg Patient Gender: F              HR:           68 bpm. Exam Location:  Inpatient Procedure: 2D Echo, 3D Echo, Cardiac Doppler and Color Doppler Indications:    TIA  History:        Patient has prior history of Echocardiogram examinations, most                 recent 03/23/2018. CHF, CAD, Abnormal ECG, Arrythmias:Atrial                 Fibrillation, Signs/Symptoms:Altered Mental Status, Chest Pain,                 Dyspnea and Syncope; Risk Factors:Hypertension, Dyslipidemia and                 Current Smoker. ESRD. Elevated troponin.  Sonographer:    Roseanna Rainbow RDCS Referring Phys: [id number] [place]  1. Left ventricular ejection fraction, by estimation, is 65 to 70%. The left ventricle has normal function. The left ventricle has no regional wall motion abnormalities. There is severe left ventricular hypertrophy. Left ventricular diastolic parameters  are indeterminate. Elevated left atrial pressure.  2. Right ventricular systolic function is normal. The right ventricular size is normal. There is normal pulmonary artery systolic pressure.  3. Left atrial size was mildly dilated.  4. The mitral valve is normal in structure. No evidence of mitral valve regurgitation. No evidence of mitral stenosis.  5. The aortic valve is tricuspid. There is mild calcification of the aortic valve. There is mild thickening of the aortic valve. Aortic valve regurgitation is not visualized. No aortic stenosis is present.  6. The inferior vena cava is normal in size with greater than 50% respiratory variability, suggesting right atrial pressure of 3 mmHg. FINDINGS  Left Ventricle: Left ventricular ejection fraction, by estimation, is 65 to 70%. The left ventricle has normal function. The left ventricle has no regional wall motion abnormalities. The left ventricular internal  cavity size was normal in size. There is  severe left ventricular hypertrophy. Left ventricular diastolic parameters are indeterminate. Elevated left atrial pressure. Right Ventricle: The right ventricular size is normal. No increase in right ventricular wall thickness. Right ventricular systolic function is normal. There is normal pulmonary artery systolic pressure. The tricuspid regurgitant velocity is 2.08 m/s, and  with an assumed right atrial pressure of 3 mmHg, the estimated right ventricular systolic pressure is 78.9 mmHg. Left Atrium: Left atrial size was mildly dilated. Right Atrium: Right atrial size was normal in size. Pericardium: There is no evidence of pericardial effusion. Mitral Valve: The mitral valve is normal in structure. No evidence of mitral valve regurgitation. No evidence of mitral valve stenosis. Tricuspid Valve: The tricuspid valve is normal in structure. Tricuspid valve regurgitation is mild . No evidence of tricuspid stenosis. Aortic Valve: The aortic valve is tricuspid. There is mild calcification of the aortic valve. There is mild thickening of the aortic valve. There is mild aortic valve annular calcification. Aortic valve regurgitation is not visualized. No aortic stenosis  is present. Aortic valve mean gradient measures 3.4 mmHg. Aortic valve peak gradient measures 7.7 mmHg. Aortic valve area, by VTI measures 2.56 cm. Pulmonic Valve: The pulmonic valve was not well visualized. Pulmonic valve regurgitation is not visualized. No evidence of pulmonic stenosis. Aorta: The aortic root is normal in size and structure. Pulmonary Artery: No significant pulmonary HTN, PASP is 20 mmHg. Venous: The inferior vena cava is normal in size with greater than 50% respiratory variability, suggesting right atrial pressure of 3 mmHg. IAS/Shunts: No atrial level shunt detected by color flow Doppler.  LEFT VENTRICLE PLAX 2D LVIDd:         3.40 cm     Diastology LVIDs:         2.20 cm     LV e' medial:     4.35 cm/s LV PW:         1.80 cm     LV E/e' medial:  22.0 LV IVS:        1.90 cm     LV e' lateral:   5.22 cm/s LVOT diam:     1.90 cm     LV E/e' lateral: 18.3 LV SV:         73 LV SV Index:   51 LVOT Area:     2.84 cm  LV Volumes (MOD) LV vol d, MOD A2C: 68.2 ml LV vol d, MOD A4C: 82.9 ml LV vol s, MOD A2C: 15.1 ml LV vol s, MOD A4C: 29.4 ml LV SV MOD A2C:     53.1 ml LV SV MOD A4C:     82.9 ml LV SV MOD BP:      53.4 ml RIGHT VENTRICLE             IVC RV S prime:     10.80 cm/s  IVC diam: 1.20 cm TAPSE (M-mode): 2.1 cm LEFT ATRIUM           Index       RIGHT ATRIUM           Index LA diam:      3.40 cm 2.38 cm/m  RA Area:     13.50 cm LA Vol (A2C): 42.5 ml 29.80 ml/m RA Volume:   33.20 ml  23.28 ml/m LA Vol (A4C): 21.1 ml 14.79 ml/m  AORTIC VALVE AV Area (Vmax):    2.40 cm AV Area (Vmean):   2.39 cm AV Area (VTI):     2.56 cm AV Vmax:  138.50 cm/s AV Vmean:          86.255 cm/s AV VTI:            0.285 m AV Peak Grad:      7.7 mmHg AV Mean Grad:      3.4 mmHg LVOT Vmax:         117.00 cm/s LVOT Vmean:        72.700 cm/s LVOT VTI:          0.258 m LVOT/AV VTI ratio: 0.90  AORTA Ao Root diam: 3.00 cm Ao Asc diam:  3.00 cm MITRAL VALVE               TRICUSPID VALVE MV Area (PHT): 4.31 cm    TR Peak grad:   17.3 mmHg MV Decel Time: 176 msec    TR Vmax:        208.00 cm/s MV E velocity: 95.70 cm/s MV A velocity: 93.00 cm/s  SHUNTS MV E/A ratio:  1.03        Systemic VTI:  0.26 m                            Systemic Diam: 1.90 cm Carlyle Dolly MD Electronically signed by Carlyle Dolly MD Signature Date/Time: 10/07/2020/11:53:40 AM    Final    US THYROID  Result Date: 10/08/2020 CLINICAL DATA:  Palpable abnormality. Palpable left-sided thyroid nodule on physical examination. EXAM: THYROID ULTRASOUND TECHNIQUE: Ultrasound examination of the thyroid gland and adjacent soft tissues was performed. COMPARISON:  None. FINDINGS: Parenchymal Echotexture: Mildly heterogenous Isthmus: Normal in size  measures 0.2 cm in diameter Right lobe: Normal in size measuring 4.1 x 1.4 x 1.6 cm Left lobe: Normal in size measuring 4.9 x 2.1 x 2.9 cm _________________________________________________________ Estimated total number of nodules >/= 1 cm: 2 Number of spongiform nodules >/=  2 cm not described below (TR1): 0 Number of mixed cystic and solid nodules >/= 1.5 cm not described below (TR2): 0 _________________________________________________________ There is an approximately 1.2 x 0.9 x 0.8 cm partially solid though prominently cystic nodule within the superior pole the right lobe of the thyroid labeled 1), which does not meet criteria to recommend percutaneous sampling or continued dedicated follow-up. There is a punctate (approximately 0.8 cm) isoechoic nodule within the mid aspect the right lobe of the thyroid (labeled 2), which does not meet criteria to recommend percutaneous sampling or continued dedicated follow-up There is a punctate (approximately 0.4 cm) anechoic cyst within the inferior pole the right lobe of the thyroid (labeled 3), which does not meet criteria to recommend percutaneous sampling or continued dedicated follow-up. There is a punctate (approximately 0.6 cm) isoechoic ill-defined nodule/pseudonodule within mid aspect the right lobe of the thyroid labeled 4), which does not meet criteria to recommend percutaneous sampling or continued dedicated follow-up _________________________________________________________ Nodule # 5: Location: Left; Mid - this nodule likely correlates with the patient's palpable area of concern Maximum size: 3.7 cm; Other 2 dimensions: 2.6 x 1.8 cm Composition: solid/almost completely solid (2) Echogenicity: hypoechoic (2) Shape: not taller-than-wide (0) Margins: smooth (0) Echogenic foci: none (0) ACR TI-RADS total points: 4. ACR TI-RADS risk category: TR4 (4-6 points). ACR TI-RADS recommendations: **Given size (>/= 1.5 cm) and appearance, fine needle aspiration of this  moderately suspicious nodule should be considered based on TI-RADS criteria. _________________________________________________________ There is a punctate (approximately 0.8 cm spongiform/benign-appearing nodule within mid aspect the left lobe of the thyroid (labeled  6), which does not meet criteria to recommend percutaneous sampling or continued dedicated follow-up. _________________________________________________________ Nodule # 7: Location: Left; Superior Maximum size: 0.8 cm; Other 2 dimensions: 0.8 x 0.4 cm Composition: solid/almost completely solid (2) Echogenicity: isoechoic (1) Shape: not taller-than-wide (0) Margins: ill-defined (0) Echogenic foci: macrocalcifications (1) ACR TI-RADS total points: 4. ACR TI-RADS risk category: TR4 (4-6 points). ACR TI-RADS recommendations: Given size (<0.9 cm) and appearance, this nodule does NOT meet TI-RADS criteria for biopsy or dedicated follow-up. _________________________________________________________ There is a punctate (approximately 0.9 cm) anechoic cyst within the left side of the thyroid isthmus (labeled 8), which contains an internal echogenic foci ring down artifact compatible with benign colloid. This benign colloid containing cyst does not meet criteria to recommend percutaneous sampling or continued dedicated follow-up. IMPRESSION: 1. Findings suggestive of multinodular goiter. 2. Nodule #5, likely correlating with the patient's palpable area of concern, meets imaging criteria to recommend percutaneous sampling as indicated. 3. None of the additional discretely measured nodules/cysts, meet imaging criteria to recommend percutaneous sampling or continued dedicated follow-up. The above is in keeping with the ACR TI-RADS recommendations - J Am Coll Radiol 2017;14:587-595. Electronically Signed   By: Sandi Mariscal M.D.   On: 10/08/2020 07:59   CT HEAD CODE STROKE WO CONTRAST  Result Date: 10/06/2020 CLINICAL DATA:  Code stroke.  Weakness EXAM: CT HEAD  WITHOUT CONTRAST TECHNIQUE: Contiguous axial images were obtained from the base of the skull through the vertex without intravenous contrast. COMPARISON:  02/16/2018 FINDINGS: Brain: Old small vessel cerebellar infarctions as seen previously. Cerebral hemispheres show old infarction in the left parietal lobe which is unchanged. Extensive chronic small-vessel ischemic changes of the white matter as seen previously. Old infarction in the left basal ganglia. Right VP shunt placed from a frontal approach enters the right lateral ventricle, extends through the foramen of Monro into the third ventricle and has its tip in the region of the left midbrain. 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 Previous aneurysm clip at the base of the brain on the right. No sign of acute infarction, hemorrhage, hydrocephalus or extra-axial collection. Vascular: There is atherosclerotic calcification of the major vessels at the base of the brain. Skull: Craniotomy on the right in the distant past. No acute finding. Sinuses/Orbits: Clear/normal Other: None IMPRESSION: 1. No acute finding by CT. Extensive chronic small-vessel ischemic changes throughout the brain as seen previously. 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 2. Right VP shunt in place. No hydrocephalus. 3. These results were communicated to [facility] at 5:43 pmon 12/18/2021by text page via the [facility] messaging system. Electronically Signed   By: Nelson Chimes M.D.   On: 10/06/2020 17:44    Scheduled Meds: .  stroke: mapping our early stages of recovery book   Does not apply Once  . amiodarone  200 mg Oral Daily  . aspirin EC  81 mg Oral Daily  . Chlorhexidine Gluconate Cloth  6 each Topical Q0600  . metoprolol tartrate  25 mg Oral BID  . rosuvastatin  10 mg Oral Daily   Continuous Infusions: . sodium chloride    . sodium chloride    . heparin 600 Units/hr (10/07/20 1837)     LOS: 1 day    Time spent: 45 mins    Akin Yi,  MD [facility]   If 7PM-7AM, please contact night-coverage

## 2020-10-08 NOTE — Progress Notes (Signed)
Pt not a good historian, therefore admission documentation not complete.

## 2020-10-08 NOTE — Progress Notes (Signed)
Progress Note  Patient Name: [name] Date of Encounter: 10/08/2020  [facility] Cardiologist: Shelva Majestic, MD   Subjective   Patient sleepy, awakes upon touch. Denies chest pain or SOB.   Converted back to NSR with dilt. Now on amiodarone 200mg  daily Trop 80>681>1127>1480 Was started on heparin on 10/06/20 as cleared by Neuro  Inpatient Medications    Scheduled Meds: .  stroke: mapping our early stages of recovery book   Does not apply Once  . amiodarone  200 mg Oral Daily  . aspirin EC  81 mg Oral Daily  . metoprolol tartrate  25 mg Oral BID  . rosuvastatin  10 mg Oral Daily   Continuous Infusions: . heparin 600 Units/hr (10/07/20 1837)   PRN Meds: acetaminophen **OR** acetaminophen (TYLENOL) oral liquid 160 mg/5 mL **OR** acetaminophen, hydrALAZINE   Vital Signs    Vitals:   10/07/20 2351 10/08/20 0401 10/08/20 0504 10/08/20 0807  BP: (!) 162/65 (!) 169/70  131/63  Pulse: 66 73  69  Resp:  16  16  Temp:  98.4 F (36.9 C)  98 F (36.7 C)  TempSrc:  Oral  Oral  SpO2: 99% 100%  100%  Weight:   42.6 kg     Intake/Output Summary (Last 24 hours) at 10/08/2020 0815 Last data filed at 10/08/2020 0308 Gross per 24 hour  Intake 407.24 ml  Output --  Net 407.24 ml   Last 3 Weights 10/08/2020 12/06/2019 02/03/2019  Weight (lbs) 93 lb 14.7 oz 103 lb 4.8 oz 99 lb 13.9 oz  Weight (kg) 42.6 kg 46.857 kg 45.3 kg  Some encounter information is confidential and restricted. Go to Review Flowsheets activity to see all data.     Telemetry    NSR - Personally Reviewed  ECG    NSR with LVH- Personally Reviewed  Physical Exam   GEN: Thin female. Comfortable but confused. Neck: No JVD Cardiac: RRR, 2/6 systolic murmur. No rubs, or gallops.  Respiratory: Clear to auscultation bilaterally. GI: Soft, nontender, non-distended  MS: No edema; No deformity. Neuro:  Confused. Able to say she is in hospital but does not know year or president Psych: Normal affect    Labs    High Sensitivity Troponin:   Recent Labs  Lab 10/06/20 1728 10/06/20 1947 10/06/20 2337 10/07/20 0205 10/07/20 0826  TROPONINIHS 80* 210* 681* 1,127* 1,480*     Chemistry Recent Labs  Lab 10/06/20 1726 10/06/20 1728  NA 137 141  K 4.5 4.0  CL 95* 90*  CO2  --  25  GLUCOSE 176* 186*  BUN 29* 20  CREATININE 4.60* 4.75*  CALCIUM  --  9.1  PROT  --  8.3*  ALBUMIN  --  4.0  AST  --  18  ALT  --  12  ALKPHOS  --  61  BILITOT  --  0.3  GFRNONAA  --  10*  ANIONGAP  --  26*     Hematology Recent Labs  Lab 10/06/20 1726 10/06/20 1728  WBC  --  8.2  RBC  --  5.04  HGB 16.3* 14.7  HCT 48.0* 46.8*  MCV  --  92.9  MCH  --  29.2  MCHC  --  31.4  RDW  --  15.0  PLT  --  170   BNPNo results for input(s): BNP, PROBNP in the last 168 hours.   DDimer No results for input(s): DDIMER in the last 168 hours.   Radiology   DG Chest  2 View  Result Date: 10/06/2020 CLINICAL DATA:  Altered level of consciousness EXAM: CHEST - 2 VIEW COMPARISON:  02/02/2019 FINDINGS: Frontal and lateral views of the chest demonstrates stable ventriculostomy catheter within the right anterior chest. Cardiac silhouette is stable. No acute airspace disease, effusion, or pneumothorax. No acute bony abnormalities. IMPRESSION: 1. Stable chest, no acute process. Electronically Signed   By: Randa Ngo M.D.   On: 10/06/2020 23:30   CT Angio Neck W and/or Wo Contrast  Result Date: 10/06/2020 CLINICAL DATA:  Weakness.  Acute stroke presentation.  IMPRESSION: 1. No acute large or medium vessel occlusion. 2. Aortic atherosclerosis. 3. Atherosclerotic disease at both carotid bifurcations. No measurable stenosis on the right. 50% stenosis of the ICA bulb on the left. 4. 50% stenosis at both vertebral artery origins. Additional stenoses along the cervical course of both vertebral arteries. 50% stenosis of the right vertebral artery V4 segment. 5. 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  No sign of recurrent flow or coil compaction. 6. Aneurysm clips at the right MCA bifurcation and supraclinoid ICA region. No sign of residual aneurysmal flow. 7. 50% stenosis of both carotid siphon regions. 8. Enlarged heterogeneous thyroid gland with dominant nodule on the left measuring at least 3 cm in diameter. Recommend thyroid US (ref: J Am Coll Radiol. 2015 Feb;12(2): 143-50). Aortic Atherosclerosis (ICD10-I70.0). Electronically Signed   By:  Chimes M.D.   On: 10/06/2020 22:03   EEG adult  Result Date: 10/07/2020   IMPRESSION: This study is within normal limits. No seizures or epileptiform discharges were seen throughout the recording. Lora Havens   US THYROID  Result Date: 10/08/2020 CLINICAL DATA:  Palpable abnormality. Palpable left-sided thyroid nodule on physical examination. IMPRESSION: 1. Findings suggestive of multinodular goiter. 2. Nodule #5, likely correlating with the patient's palpable area of concern, meets imaging criteria to recommend percutaneous sampling as indicated. 3. None of the additional discretely measured nodules/cysts, meet imaging criteria to recommend percutaneous sampling or continued dedicated follow-up. The above is in keeping with the ACR TI-RADS recommendations - J Am Coll Radiol 2017;14:587-595. Electronically Signed   By: Sandi Mariscal M.D.   On: 10/08/2020 07:59   CT HEAD CODE STROKE WO CONTRAST  Result Date: 10/06/2020 CLINICAL DATA:  Code stroke.  Weakness  IMPRESSION: 1. No acute finding by CT. Extensive chronic small-vessel ischemic changes throughout the brain as seen previously. 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 2. Right VP shunt in place. No hydrocephalus. 3. These results were communicated to [facility] at 5:43 pmon 12/18/2021by text page via the [facility] messaging system.  Cardiac Studies   TTE 2019: Study Conclusions   - Left ventricle: The cavity size was normal. There was moderate  concentric hypertrophy. Systolic  function was normal. The  estimated ejection fraction was in the range of 60% to 65%. Wall  motion was normal; there were no regional wall motion  abnormalities. There was an increased relative contribution of  atrial contraction to ventricular filling. Doppler parameters are  consistent with abnormal left ventricular relaxation (grade 1  diastolic dysfunction).  - Aortic valve: Moderate focal calcification involving the left  coronary cusp.  - Pulmonary arteries: Systolic pressure could not be accurately  estimated.  - Pericardium, extracardiac: A small, free-flowing pericardial  effusion was identified circumferential to the heart. The fluid  had no internal echoes.There was no evidence of hemodynamic  compromise.   Carotids 2015: Summary:   - Mild technical difficulty on the right  distal ICA due to  tortuosity  - Bilateral - 1% to 39% ICA stenosis. Vertebral artery flow  is antegrade.  Other specific details can be found in the table(s) above.   Prepared and Electronically Authenticated by   TEE 2015: Study Conclusions   - Left ventricle: The cavity size was normal. Wall thickness  was normal. Systolic function was normal. The estimated  ejection fraction was in the range of 60% to 65%.  - Aortic valve: No evidence of vegetation.  - Mitral valve: No evidence of vegetation.  - Left atrium: No evidence of thrombus in the atrial cavity  or appendage. No evidence of thrombus in the appendage.  - Right atrium: No evidence of thrombus in the atrial cavity  or appendage. No evidence of thrombus in the atrial cavity  or appendage.  - Atrial septum: No defect or patent foramen ovale was  identified. Echo contrast study showed no right-to-left  atrial level shunt, following an increase in RA pressure  induced by provocative maneuvers. Echo contrast study  showed no right-to-left atrial level shunt, following an  increase in RA  pressure induced by provocative maneuvers.  - Tricuspid valve: No evidence of vegetation.  - Pulmonic valve: No evidence of vegetation.  - Superior vena cava: The study excluded a thrombus.  - Pericardium, extracardiac: A trivial pericardial effusion  was identified.   Patient Profile     59 y.o. 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 Multiple admissions for Afib with RVR and NSTEMI. Maintained on amiodarone but compliance/follow-up is an issue. Not a candidate for long term AC due to history of ICH. -Continue amiodarone 200mg  daily -Continue metop 25mg  BID -Continue heparin gtt for 48h for NSTEMI -No long term AC due to history of ICH  #NSTEMI: #CAD s/p PCI to RCA in 2009 Degree of troponin elevation appears out of proportion to Afib with RVR and continues to rise. Has known CAD with history of PCI in the past with residual 70% dLAD & 50% dCFX. Patient is not an ideal candidate for cath given history of ICH, risk of DAPT, and poor compliance. She is fortunately chest pain free.  TTE shows preserved LV function. Favor medical management for now unless significant clinical change. -Recommend with medical management as patient chest pain free and very high risk for DAPT given history of ICH in the past unless signficant clinical change -Continue heparin gtt for 48hours for medical management of CAD -No ASA given ICH and heparin currently -Continue  metop 25mg  BID -Continue rosuvastatin  #ESRD on HD: -Continue HD  #HTN: -Continue home meds  #HLD: -Statin as above  #Encephalopathy: -Improved with HD -Management per primary team  [facility] will sign off.   Medication Recommendations:  As above Other recommendations (labs, testing, etc):  No further testing Follow up as an outpatient:  We will arrange  For questions or updates, please contact [place] Please consult [email or web address] for  contact info under     Signed, Ena Dawley, MD  10/08/2020, 8:15 AM

## 2020-10-08 NOTE — Progress Notes (Signed)
°  [facility] Progress Note   Assessment/ Plan:   Dialysis Orders:  TTS - [facility]  4hrs, BFR 400, DFR 800,  EDW 37kg, 2K/ 2Ca  Access: RU AVF  Heparin none Sensipar 120mg  qHD Calcitriol 36mcg qHD  Assessment/Plan: 1.  AMS - CT head/neck and CXR negative for acute process.  EEG negative. Improving, appears close to her normal baseline at dialysis. Wkup per admit.  2. Rising troponin/NSTEMI - Known Hx CAD.  TTE with preserved function. Not a good candidate for cath per cardio, started on heparin drip x 48hrs - cleared by neruo.  Plan for medical management at this time. Per cardio 3. A fib - converted back to NSR.  On amiodarone. Per cardio 4.  ESRD -  On HD TTS.  No acute dialysis needs.  Plan for HD 12/21 per regular schedule. K 4.0. 5.  Hypertension/volume  - BP elevated, continue home meds. Euvolemic on exam.  UF to dry weight.  6.  Anemia of CKD - Hgb 14.7. No indication for ESA. 7.  Secondary Hyperparathyroidism -  Ca in goal. Will check phos. Continue VDRA, sensipar and binders 8.  Nutrition - Renal diet w/fluid restrictions. 9. Seizure disorder - EEG negative 10. Hx ICH x2 with VP shunt placement - stable on CT. Per neuro 11. Dementia 12. Access: Will need to have VVS eval likely- this can be accomplished as an OP- looks like she was referred back in October but wasn't able to keep appt?     Subjective:    Seen in room.  No complaints.     Objective:   BP 131/63 (BP Location: Left Arm)    Pulse 69    Temp 98 F (36.7 C) (Oral)    Resp 16    Wt 42.6 kg    SpO2 100%    BMI 17.75 kg/m   Physical Exam: Gen: NAD, sitting in bed CVS: RRR Resp: clear Abd: abd Ext: no LE edema ACCESS: R AVF aneurysmal, some thinned skin, some eschar  Labs: BMET Recent Labs  Lab 10/06/20 1726 10/06/20 1728  NA 137 141  K 4.5 4.0  CL 95* 90*  CO2  --  25  GLUCOSE 176* 186*  BUN 29* 20  CREATININE 4.60* 4.75*  CALCIUM  --  9.1   CBC Recent Labs  Lab 10/06/20 1726  10/06/20 1728  WBC  --  8.2  NEUTROABS  --  6.1  HGB 16.3* 14.7  HCT 48.0* 46.8*  MCV  --  92.9  PLT  --  170      Medications:      stroke: mapping our early stages of recovery book   Does not apply Once   amiodarone  200 mg Oral Daily   aspirin EC  81 mg Oral Daily   metoprolol tartrate  25 mg Oral BID   rosuvastatin  10 mg Oral Daily     Madelon Lips MD 10/08/2020, 10:57 AM

## 2020-10-08 NOTE — Progress Notes (Signed)
[place] for Heparin  Indication: chest pain/ACS, also has hx afib  Allergies  Allergen Reactions  . Ampicillin Hives and Rash    Has patient had a PCN reaction causing immediate rash, facial/tongue/throat swelling, SOB or lightheadedness with hypotension: No Has patient had a PCN reaction causing severe rash involving mucus membranes or skin necrosis: No Has patient had a PCN reaction that required hospitalization: No Has patient had a PCN reaction occurring within the last 10 years: No If all of the above answers are "NO", then may proceed with Cephalosporin use.   Marland Kitchen Penicillins Hives and Rash    Has patient had a PCN reaction causing immediate rash, facial/tongue/throat swelling, SOB or lightheadedness with hypotension: Yes Has patient had a PCN reaction causing severe rash involving mucus membranes or skin necrosis: No Has patient had a PCN reaction that required hospitalization: No Has patient had a PCN reaction occurring within the last 10 years: No If all of the above answers are "NO", then may proceed with Cephalosporin use.      Vital Signs: Temp: 97.6 F (36.4 C) (12/19 2307) Temp Source: Oral (12/19 2307) BP: 162/65 (12/19 2351) Pulse Rate: 66 (12/19 2351)  Labs: Recent Labs    10/06/20 1726 10/06/20 1728 10/06/20 1947 10/06/20 2337 10/07/20 0205 10/07/20 0826 10/07/20 1707 10/08/20 0233  HGB 16.3* 14.7  --   --   --   --   --   --   HCT 48.0* 46.8*  --   --   --   --   --   --   PLT  --  170  --   --   --   --   --   --   APTT  --  26  --   --   --   --   --   --   LABPROT  --  12.8  --   --   --   --   --   --   INR  --  1.0  --   --   --   --   --   --   HEPARINUNFRC  --   --   --   --   --   --  0.62 0.48  CREATININE 4.60* 4.75*  --   --   --   --   --   --   TROPONINIHS  --  80*   < > 681* 1,127* 1,480*  --   --    < > = values in this interval not displayed.    CrCl cannot be calculated (Unknown ideal  weight.).   Medical History: Past Medical History:  Diagnosis Date  . Anterior cerebral artery aneurysm    1996 AT [facility]  . Coronary atherosclerosis of native coronary artery    STEMI Feb 2009 - 70% distal LAD lesion c/w plaque rupture, DES distal RCA  . Diastolic heart failure    LVEF 60-65% with grade 2 diastolic dysfunction - July 2014  . End-stage renal disease on hemodialysis ([place]) 02/03/2019  . ESRD (end stage renal disease) on dialysis ([place])   . HTN (hypertension)    Resistant  . Hypercholesterolemia   . Subarachnoid hemorrhage ([place])    03/2012  . Tobacco abuse    Resumed smoking half a pack a day. She was a smoker in the past and states she was abl eto stop in the past using nicotine patches  . Unspecified atrial fibrillation (  [place]) 02/03/2019    Assessment: 59 y.o. female with Afib and NSTEMI for heparin  Goal of Therapy:  Heparin level 0.3-0.5 units/ml Monitor platelets by anticoagulation protocol: Yes   Plan:  Continue Heparin at current rate   Phillis Knack, PharmD, BCPS

## 2020-10-08 NOTE — Progress Notes (Signed)
Pt w/ DM2 and w/o CBG check order. Dr. Hal Hope informed and an order requested. Still waiting.

## 2020-10-08 NOTE — Progress Notes (Signed)
Occupational Therapy Evaluation Patient Details Name: [name] MRN: [id number] DOB: Apr 01, 1961 Today's Date: 10/08/2020    History of Present Illness This 59 y.o. female with PMH significant for ESRD on HD, T2DM, HLD, CAD with stents, chronic diastolic CHF, A. 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  Patient was brought to the ED on 12/18 from dialysis center for evaluation of decreased responsiveness.  Pt admitted with acute encephalopathy, afib with RVR, and elevated troponin/NSTEMI with cardio consulted.   Clinical Impression   PT admitted with acute encephalopathy. Pt currently with functional limitiations due to the deficits listed below (see OT problem list). Pt with min guard transfers with RW. Question if patient is near baseline function with transfer/ adls. No family present. Ot to see patient 1 more time to confirm return home.  Pt will benefit from skilled OT to increase their independence and safety with adls and balance to allow discharge home.     Follow Up Recommendations  No OT follow up    Equipment Recommendations  None recommended by OT    Recommendations for Other Services       Precautions / Restrictions Precautions Precautions: Fall      Mobility Bed Mobility Overal bed mobility: Needs Assistance Bed Mobility: Supine to Sit     Supine to sit: Min guard     General bed mobility comments: Cues for sequence and to complete transfer    Transfers Overall transfer level: Needs assistance Equipment used: Rolling walker (2 wheeled) Transfers: Sit to/from Stand Sit to Stand: Min assist         General transfer comment: min A to steady    Balance Overall balance assessment: Needs assistance Sitting-balance support: No upper extremity supported Sitting balance-Leahy Scale: Good     Standing balance support: Bilateral upper extremity supported Standing balance-Leahy  Scale: Poor Standing balance comment: required RW or HHA                           ADL either performed or assessed with clinical judgement   ADL Overall ADL's : Needs assistance/impaired Eating/Feeding: Set up Eating/Feeding Details (indicate cue type and reason): eating with hands because unaware utensils in the paper package Grooming: Supervision/safety               Lower Body Dressing: Min guard Lower Body Dressing Details (indicate cue type and reason): don socks Toilet Transfer: Min Psychiatric nurse Details (indicate cue type and reason): cues for placement of RW         Functional mobility during ADLs: Min guard       Vision   Vision Assessment?: No apparent visual deficits     Perception     Praxis      Pertinent Vitals/Pain Pain Assessment: No/denies pain     Hand Dominance Right   Extremity/Trunk Assessment Upper Extremity Assessment Upper Extremity Assessment: Overall WFL for tasks assessed   Lower Extremity Assessment Lower Extremity Assessment: Defer to PT evaluation   Cervical / Trunk Assessment Cervical / Trunk Assessment: Normal   Communication Communication Communication: Expressive difficulties;Other (comment)   Cognition Arousal/Alertness: Awake/alert Behavior During Therapy: WFL for tasks assessed/performed Overall Cognitive Status: No family/caregiver present to determine baseline cognitive functioning Area of Impairment: Orientation;Following commands;Safety/judgement;Awareness;Problem solving                 Orientation Level: Disoriented to;Place;Time;Situation     Following  Commands: Follows one step commands inconsistently Safety/Judgement: Decreased awareness of safety;Decreased awareness of deficits   Problem Solving: Slow processing General Comments: pt unaware of location and reason for admission. pt educated and pt states "oh okay"   General Comments  VSS    Exercises     Shoulder  Instructions      Home Living Family/patient expects to be discharged to:: Private residence Living Arrangements: Children Available Help at Discharge: Family;Available 24 hours/day Type of Home: House Home Access: Level entry     Home Layout: One level     Bathroom Shower/Tub: Teacher, early years/pre: Standard Bathroom Accessibility: Yes   Home Equipment: Environmental consultant - 2 wheels   Additional Comments: noted to live with daughter and uncertain if home setup accurate as pt is poor historian      Prior Functioning/Environment Level of Independence: Needs assistance        Comments: Per prior admission has 24/7 supervision, Independent with ADLs but not IADLs, pt reports uses RW        OT Problem List: Impaired balance (sitting and/or standing);Decreased activity tolerance      OT Treatment/Interventions: Therapeutic activities;Self-care/ADL training;DME and/or AE instruction;Balance training;Patient/family education    OT Goals(Current goals can be found in the care plan section) Acute Rehab OT Goals Patient Stated Goal: unable to stated OT Goal Formulation: With patient Time For Goal Achievement: 10/22/20 Potential to Achieve Goals: Good  OT Frequency: Other (comment) (1 more treatment)   Barriers to D/C:            Co-evaluation PT/OT/SLP Co-Evaluation/Treatment: Yes Reason for Co-Treatment: For patient/therapist safety PT goals addressed during session: Mobility/safety with mobility OT goals addressed during session: ADL's and self-care      AM-PAC OT "6 Clicks" Daily Activity     Outcome Measure Help from another person eating meals?: A Little Help from another person taking care of personal grooming?: A Little Help from another person toileting, which includes using toliet, bedpan, or urinal?: A Little Help from another person bathing (including washing, rinsing, drying)?: A Little Help from another person to put on and taking off regular upper  body clothing?: A Little Help from another person to put on and taking off regular lower body clothing?: A Little 6 Click Score: 18   End of Session Equipment Utilized During Treatment: Gait belt;Rolling walker Nurse Communication: Mobility status;Precautions  Activity Tolerance: Patient tolerated treatment well Patient left: in chair;with call bell/phone within reach;with chair alarm set  OT Visit Diagnosis: Unsteadiness on feet (R26.81)                Time: 1331-1350 OT Time Calculation (min): 19 min Charges:  OT General Charges $OT Visit: 1 Visit OT Evaluation $OT Eval Low Complexity: 1 Low   Brynn, OTR/L  Acute Rehabilitation Services Pager: [phone] Office: [phone] .   Jeri Modena 10/08/2020, 3:26 PM

## 2020-10-09 LAB — BASIC METABOLIC PANEL
Anion gap: 20 — ABNORMAL HIGH (ref 5–15)
BUN: 62 mg/dL — ABNORMAL HIGH (ref 6–20)
CO2: 24 mmol/L (ref 22–32)
Calcium: 9.6 mg/dL (ref 8.9–10.3)
Chloride: 83 mmol/L — ABNORMAL LOW (ref 98–111)
Creatinine, Ser: 9.86 mg/dL — ABNORMAL HIGH (ref 0.44–1.00)
GFR, Estimated: 4 mL/min — ABNORMAL LOW (ref >60)
Glucose, Bld: 85 mg/dL (ref 70–99)
Potassium: 4.4 mmol/L (ref 3.5–5.1)
Sodium: 127 mmol/L — ABNORMAL LOW (ref 135–145)

## 2020-10-09 LAB — CBC
HCT: 35.8 % — ABNORMAL LOW (ref 36.0–46.0)
Hemoglobin: 12.4 g/dL (ref 12.0–15.0)
MCH: 30 pg (ref 26.0–34.0)
MCHC: 34.6 g/dL (ref 30.0–36.0)
MCV: 86.7 fL (ref 80.0–100.0)
Platelets: 195 10*3/uL (ref 150–400)
RBC: 4.13 MIL/uL (ref 3.87–5.11)
RDW: 14.7 % (ref 11.5–15.5)
WBC: 4.5 10*3/uL (ref 4.0–10.5)
nRBC: 0 % (ref 0.0–0.2)

## 2020-10-09 LAB — GLUCOSE, CAPILLARY
Glucose-Capillary: 108 mg/dL — ABNORMAL HIGH (ref 70–99)
Glucose-Capillary: 133 mg/dL — ABNORMAL HIGH (ref 70–99)
Glucose-Capillary: 140 mg/dL — ABNORMAL HIGH (ref 70–99)
Glucose-Capillary: 80 mg/dL (ref 70–99)
Glucose-Capillary: 83 mg/dL (ref 70–99)

## 2020-10-09 LAB — MAGNESIUM: Magnesium: 2.4 mg/dL (ref 1.7–2.4)

## 2020-10-09 LAB — HEPARIN LEVEL (UNFRACTIONATED): Heparin Unfractionated: 0.45 IU/mL (ref 0.30–0.70)

## 2020-10-09 LAB — PHOSPHORUS: Phosphorus: 10.1 mg/dL — ABNORMAL HIGH (ref 2.5–4.6)

## 2020-10-09 MED ORDER — ALTEPLASE 2 MG IJ SOLR: 2.0000 mg | Freq: Once | INTRAMUSCULAR | Status: DC | PRN

## 2020-10-09 MED ORDER — HEPARIN SODIUM (PORCINE) 1000 UNIT/ML DIALYSIS: 1000.0000 [IU] | INTRAMUSCULAR | Status: DC | PRN

## 2020-10-09 NOTE — Progress Notes (Addendum)
  [facility] Progress Note   Assessment/ Plan:   Dialysis Orders:  TTS - [facility]  4hrs, BFR 400, DFR 800,  EDW 37kg, 2K/ 2Ca  Access: RU AVF  Heparin none Sensipar 120mg  qHD Calcitriol 29mcg qHD  Assessment/Plan: 1.  AMS - CT head/neck and CXR negative for acute process.  EEG negative. Improving, appears close to her normal baseline at dialysis. Wkup per admit.  2. Rising troponin/NSTEMI - Known Hx CAD.  TTE with preserved function. Not a good candidate for cath per cardio, started on heparin drip x 48hrs (12/19-) - cleared by neruo.  Plan for medical management at this time. Per cardio 3. A fib with RVR - converted back to NSR.  On amiodarone and metoprolol 25 mg BID. Per cardio 4.  ESRD -  On HD TTS. Plan for HD 12/21 per regular schedule. K 4.0. 5.  Hypertension/volume  - BP elevated, continue home meds. Euvolemic on exam.  UF to dry weight.  6.  Anemia of CKD - Hgb 14.7. No indication for ESA. 7.  Secondary Hyperparathyroidism -  Ca in goal. Will check phos. Continue VDRA, sensipar and binders 8.  Nutrition - Renal diet w/fluid restrictions. 9. Seizure disorder - EEG negative 10. Hx ICH x2 with VP shunt placement - stable on CT. Per neuro 11. Dementia 12. Access: Will need to have [facility] eval- I did some more digging in the outpatient dialysis chart- she was referred back in October but didn't go- I will request an eval here for aneurysms and eschar.     Subjective:    Seen on dialysis.  No complaints.  Eating sausage biscuit.      Objective:   BP 130/64   Pulse 70   Temp 98.3 F (36.8 C) (Oral)   Resp 20   Wt 43.5 kg   SpO2 100%   BMI 18.12 kg/m   Physical Exam: Gen: NAD, sitting on HD CVS: RRR Resp: clear Abd: abd Ext: no LE edema ACCESS: R AVF aneurysmal, some thinned skin, some mild eschar  Labs: BMET Recent Labs  Lab 10/06/20 1726 10/06/20 1728 10/09/20 0205  NA 137 141 127*  K 4.5 4.0 4.4  CL 95* 90* 83*  CO2  --  25 24  GLUCOSE 176*  186* 85  BUN 29* 20 62*  CREATININE 4.60* 4.75* 9.86*  CALCIUM  --  9.1 9.6  PHOS  --   --  10.1*   CBC Recent Labs  Lab 10/06/20 1726 10/06/20 1728 10/09/20 0205  WBC  --  8.2 4.5  NEUTROABS  --  6.1  --   HGB 16.3* 14.7 12.4  HCT 48.0* 46.8* 35.8*  MCV  --  92.9 86.7  PLT  --  170 195      Medications:    .  stroke: mapping our early stages of recovery book   Does not apply Once  . amiodarone  200 mg Oral Daily  . aspirin EC  81 mg Oral Daily  . Chlorhexidine Gluconate Cloth  6 each Topical Q0600  . metoprolol tartrate  25 mg Oral BID  . rosuvastatin  10 mg Oral Daily     Madelon Lips MD 10/09/2020, 11:15 AM

## 2020-10-09 NOTE — Progress Notes (Addendum)
   Patient just completed HD and has pressure dressing in place. We will wait and consult on her tomorrow  Roxy Horseman PA-C

## 2020-10-09 NOTE — Progress Notes (Signed)
PROGRESS NOTE    [name]  FUX:[id number] DOB: [birth year] DOA: 10/06/2020 PCP: Garwin Brothers, MD   Brief Narrative: This 59 y.o. female with PMH significant for ESRD on HD, T2DM, HLD, CAD with stents, chronic diastolic CHF, A. 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 Patient was brought to the ED on 12/18 from dialysis center for evaluation of decreased responsiveness, not following commands or speaking after her treatment. On arrival to the ED, code stroke was activated.  Patient was seen by neurology, symptoms has resolved.  Initial CT head negative for acute abnormalities.  CTA head and neck was negative for acute large or medium vessel occlusion.  Patient was in A. fib with RVR 150, She converted into NSR after dose of IV Cardizem and the dose of amiodarone. Patient was admitted for altered mental status which is now resolved. She is found to have elevated troponin could be due to non-STEMI,  Cardiology was consulted , recommended anticoagulation with heparin for 48 hours followed by medical management only,  Patient is not a ideal candidate for left heart cath given ICH, poor compliance.   Assessment & Plan:   Principal Problem:   AMS (altered mental status) Active Problems:   Mixed hyperlipidemia   ESRD (end stage renal disease) (HCC)   HTN (hypertension)   Atrial fibrillation with rapid ventricular response (HCC)   Elevated troponin   Acute encephalopathy: >>>> Improved. -Patient presented with altered mental status after hemodialysis. -In the setting of dialysis, atherosclerosis of intracranial vessels. -No evidence of infection. Most likely related to  received in fluid electrolytes during dialysis. -Neurology consultation appreciated. Work-up started to rule out CVA. -Head CT negative for acute finding. -CT angio of head and neck did not show any acute large or medium vessel occlusion. -Ongoing  stroke work-up including TTE, A1c, lipid panel, TSH  - Hb A1c 5.2, LDL 144, ammonia 18, UDS negative. -Currently on aspirin 81 mg daily. -Continue to monitor in telemetry. -Ammonia level normal, EEG negative for any seizure activity. - PT/OT/ST eval.   A. fib with RVR:  -Initially presented with A. fib with RVR up to 150s.   Converted to normal sinus  with IV Cardizem 10 mg and a dose of amiodarone from home.  -Cardiology consultation appreciated. Added metoprolol 25 mg twice daily. -HR better controlled.  Elevated troponin could be due to non-STEMI History of CAD with stents. -Found to have elevated troponin which are trending up. -Has history of subarachnoid hemorrhage from an aneurysm in 2013 status post coiling.  -Admitting physician discussed with neurology and got an approval to initiate heparin drip for NSTEMI -Cardiology consulted, recommended anticoagulation with heparin gtt for 48 hours. -May not be a candidate for long-term anticoagulation given history of ICH. -Patient is not a ideal candidate for cath given history of ICH, risk of DAPT, and poor compliance. -Continue Crestor, metoprolol, no aspirin given ICH.  ESRD-HD-TTS -Patient was at the dialysis center on 11/16 but she could not complete the dialysis because of her symptoms.  -Nephrology consulted, she underwent hemodialysis today as per schedule.  Chronic diastolic CHF Hypertension -Blood pressure in 140s. -Not on any diuretics or any blood pressure medicines at home. -Continue to monitor.  IV hydralazine as needed.  Ruptured cerebral aneurysm status post coiling and shunt:  - Stable findings on CT. -No indication for acute intervention.  Seizure disorder -Resume Keppra   Diet-controlled type 2 diabetes -A1c 5.2 on 2/19. -Currently  on sliding scale insulin with Accu-Cheks.  Thyroid nodule:  CT showing enlarged heterogenous thyroid gland with dominant nodule on the left measuring at least 3 cm in  diameter. -Thyroid function tests and ultrasound ordered for further evaluation.    DVT prophylaxis:  SCDS Code Status: Full code Family Communication: No family at bedside Disposition Plan:   Status is: Inpatient  Remains inpatient appropriate because:Inpatient level of care appropriate due to severity of illness   Dispo: The patient is from: Home              Anticipated d/c is to: Home              Anticipated d/c date is: 2 days              Patient currently is not medically stable to d/c.   Consultants:   Cardiology, nephrology  Procedures:  Antimicrobials:  Anti-infectives (From admission, onward)   None      Subjective: Patient was seen and examined at bedside.  Overnight events noted.   She looks older than her age.  Patient was seen in the hemodialysis unit.  She denies any complaints.  Objective: Vitals:   10/09/20 1000 10/09/20 1030 10/09/20 1120 10/09/20 1316  BP: 102/72 130/64 (!) 124/58 131/64  Pulse:    64  Resp: 15 20 17 16   Temp:   98 F (36.7 C)   TempSrc:   Oral   SpO2:   100% 100%  Weight:   41.8 kg     Intake/Output Summary (Last 24 hours) at 10/09/2020 1321 Last data filed at 10/09/2020 1120 Gross per 24 hour  Intake 440 ml  Output 1523 ml  Net -1083 ml   Filed Weights   10/08/20 0504 10/09/20 0740 10/09/20 1120  Weight: 42.6 kg 43.5 kg 41.8 kg    Examination:  General exam: Appears calm and comfortable , not in any distress.  She appears older than her age. Respiratory system: Clear to auscultation. Respiratory effort normal. Cardiovascular system: S1 & S2 heard, RRR. No JVD, murmurs, rubs, gallops or clicks. No pedal edema. Gastrointestinal system: Abdomen is nondistended, soft and nontender. No organomegaly or masses felt. Normal bowel sounds heard. Central nervous system: Alert and oriented. No focal neurological deficits. Extremities: No edema, no cyanosis, no clubbing.  AV fistula noted in the right arm. Skin: No  rashes, lesions or ulcers Psychiatry: Judgement and insight appear normal. Mood & affect appropriate.     Data Reviewed: I have personally reviewed following labs and imaging studies  CBC: Recent Labs  Lab 10/06/20 1726 10/06/20 1728 10/09/20 0205  WBC  --  8.2 4.5  NEUTROABS  --  6.1  --   HGB 16.3* 14.7 12.4  HCT 48.0* 46.8* 35.8*  MCV  --  92.9 86.7  PLT  --  170 283   Basic Metabolic Panel: Recent Labs  Lab 10/06/20 1726 10/06/20 1728 10/09/20 0205  NA 137 141 127*  K 4.5 4.0 4.4  CL 95* 90* 83*  CO2  --  25 24  GLUCOSE 176* 186* 85  BUN 29* 20 62*  CREATININE 4.60* 4.75* 9.86*  CALCIUM  --  9.1 9.6  MG  --   --  2.4  PHOS  --   --  10.1*   GFR: CrCl cannot be calculated (Unknown ideal weight.). Liver Function Tests: Recent Labs  Lab 10/06/20 1728  AST 18  ALT 12  ALKPHOS 61  BILITOT 0.3  PROT 8.3*  ALBUMIN  4.0   No results for input(s): LIPASE, AMYLASE in the last 168 hours. Recent Labs  Lab 10/07/20 0205  AMMONIA 18   Coagulation Profile: Recent Labs  Lab 10/06/20 1728  INR 1.0   Cardiac Enzymes: No results for input(s): CKTOTAL, CKMB, CKMBINDEX, TROPONINI in the last 168 hours. BNP (last 3 results) No results for input(s): PROBNP in the last 8760 hours. HbA1C: Recent Labs    10/07/20 0205  HGBA1C 5.2   CBG: Recent Labs  Lab 10/08/20 1126 10/08/20 1606 10/08/20 1922 10/08/20 2328 10/09/20 0344  GLUCAP 110* 100* 107* 124* 83   Lipid Profile: Recent Labs    10/07/20 0205  CHOL 219*  HDL 59  LDLCALC 144*  TRIG 80  CHOLHDL 3.7   Thyroid Function Tests: Recent Labs    10/07/20 0205  TSH 0.539  FREET4 0.91   Anemia Panel: No results for input(s): VITAMINB12, FOLATE, FERRITIN, TIBC, IRON, RETICCTPCT in the last 72 hours. Sepsis Labs: No results for input(s): PROCALCITON, LATICACIDVEN in the last 168 hours.  Recent Results (from the past 240 hour(s))  Resp Panel by RT-PCR (Flu A&B, Covid) Nasopharyngeal Swab      Status: None   Collection Time: 10/06/20  5:25 PM   Specimen: Nasopharyngeal Swab; Nasopharyngeal(NP) swabs in vial transport medium  Result Value Ref Range Status   SARS Coronavirus 2 by RT PCR NEGATIVE NEGATIVE Final    Comment: (NOTE) SARS-CoV-2 target nucleic acids are NOT DETECTED.  The SARS-CoV-2 RNA is generally detectable in upper respiratory specimens during the acute phase of infection. The lowest concentration of SARS-CoV-2 viral copies this assay can detect is 138 copies/mL. A negative result does not preclude SARS-Cov-2 infection and should not be used as the sole basis for treatment or other patient management decisions. A negative result may occur with  improper specimen collection/handling, submission of specimen other than nasopharyngeal swab, presence of viral mutation(s) within the areas targeted by this assay, and inadequate number of viral copies(<138 copies/mL). A negative result must be combined with clinical observations, patient history, and epidemiological information. The expected result is Negative.  Fact Sheet for Patients:  EntrepreneurPulse.com.au  Fact Sheet for Healthcare Providers:  IncredibleEmployment.be  This test is no t yet approved or cleared by the [place] FDA and  has been authorized for detection and/or diagnosis of SARS-CoV-2 by FDA under an Emergency Use Authorization (EUA). This EUA will remain  in effect (meaning this test can be used) for the duration of the COVID-19 declaration under Section 564(b)(1) of the Act, 21 U.S.C.section 360bbb-3(b)(1), unless the authorization is terminated  or revoked sooner.       Influenza A by PCR NEGATIVE NEGATIVE Final   Influenza B by PCR NEGATIVE NEGATIVE Final    Comment: (NOTE) The Xpert Xpress SARS-CoV-2/FLU/RSV plus assay is intended as an aid in the diagnosis of influenza from Nasopharyngeal swab specimens and should not be used as a sole basis  for treatment. Nasal washings and aspirates are unacceptable for Xpert Xpress SARS-CoV-2/FLU/RSV testing.  Fact Sheet for Patients: EntrepreneurPulse.com.au  Fact Sheet for Healthcare Providers: IncredibleEmployment.be  This test is not yet approved or cleared by the [place] FDA and has been authorized for detection and/or diagnosis of SARS-CoV-2 by FDA under an Emergency Use Authorization (EUA). This EUA will remain in effect (meaning this test can be used) for the duration of the COVID-19 declaration under Section 564(b)(1) of the Act, 21 U.S.C. section 360bbb-3(b)(1), unless the authorization is terminated or revoked.  Performed  at [facility], [place] [address] [place], [place] [address]      Radiology Studies: US THYROID  Result Date: 10/08/2020 CLINICAL DATA:  Palpable abnormality. Palpable left-sided thyroid nodule on physical examination. EXAM: THYROID ULTRASOUND TECHNIQUE: Ultrasound examination of the thyroid gland and adjacent soft tissues was performed. COMPARISON:  None. FINDINGS: Parenchymal Echotexture: Mildly heterogenous Isthmus: Normal in size measures 0.2 cm in diameter Right lobe: Normal in size measuring 4.1 x 1.4 x 1.6 cm Left lobe: Normal in size measuring 4.9 x 2.1 x 2.9 cm _________________________________________________________ Estimated total number of nodules >/= 1 cm: 2 Number of spongiform nodules >/=  2 cm not described below (TR1): 0 Number of mixed cystic and solid nodules >/= 1.5 cm not described below (TR2): 0 _________________________________________________________ There is an approximately 1.2 x 0.9 x 0.8 cm partially solid though prominently cystic nodule within the superior pole the right lobe of the thyroid labeled 1), which does not meet criteria to recommend percutaneous sampling or continued dedicated follow-up. There is a punctate (approximately 0.8 cm) isoechoic nodule within the mid aspect the  right lobe of the thyroid (labeled 2), which does not meet criteria to recommend percutaneous sampling or continued dedicated follow-up There is a punctate (approximately 0.4 cm) anechoic cyst within the inferior pole the right lobe of the thyroid (labeled 3), which does not meet criteria to recommend percutaneous sampling or continued dedicated follow-up. There is a punctate (approximately 0.6 cm) isoechoic ill-defined nodule/pseudonodule within mid aspect the right lobe of the thyroid labeled 4), which does not meet criteria to recommend percutaneous sampling or continued dedicated follow-up _________________________________________________________ Nodule # 5: Location: Left; Mid - this nodule likely correlates with the patient's palpable area of concern Maximum size: 3.7 cm; Other 2 dimensions: 2.6 x 1.8 cm Composition: solid/almost completely solid (2) Echogenicity: hypoechoic (2) Shape: not taller-than-wide (0) Margins: smooth (0) Echogenic foci: none (0) ACR TI-RADS total points: 4. ACR TI-RADS risk category: TR4 (4-6 points). ACR TI-RADS recommendations: **Given size (>/= 1.5 cm) and appearance, fine needle aspiration of this moderately suspicious nodule should be considered based on TI-RADS criteria. _________________________________________________________ There is a punctate (approximately 0.8 cm spongiform/benign-appearing nodule within mid aspect the left lobe of the thyroid (labeled 6), which does not meet criteria to recommend percutaneous sampling or continued dedicated follow-up. _________________________________________________________ Nodule # 7: Location: Left; Superior Maximum size: 0.8 cm; Other 2 dimensions: 0.8 x 0.4 cm Composition: solid/almost completely solid (2) Echogenicity: isoechoic (1) Shape: not taller-than-wide (0) Margins: ill-defined (0) Echogenic foci: macrocalcifications (1) ACR TI-RADS total points: 4. ACR TI-RADS risk category: TR4 (4-6 points). ACR TI-RADS recommendations:  Given size (<0.9 cm) and appearance, this nodule does NOT meet TI-RADS criteria for biopsy or dedicated follow-up. _________________________________________________________ There is a punctate (approximately 0.9 cm) anechoic cyst within the left side of the thyroid isthmus (labeled 8), which contains an internal echogenic foci ring down artifact compatible with benign colloid. This benign colloid containing cyst does not meet criteria to recommend percutaneous sampling or continued dedicated follow-up. IMPRESSION: 1. Findings suggestive of multinodular goiter. 2. Nodule #5, likely correlating with the patient's palpable area of concern, meets imaging criteria to recommend percutaneous sampling as indicated. 3. None of the additional discretely measured nodules/cysts, meet imaging criteria to recommend percutaneous sampling or continued dedicated follow-up. The above is in keeping with the ACR TI-RADS recommendations - J Am Coll Radiol 2017;14:587-595. Electronically Signed   By: Sandi Mariscal M.D.   On: 10/08/2020 07:59    Scheduled Meds: .  stroke: mapping our early stages of recovery book   Does not apply Once  . amiodarone  200 mg Oral Daily  . aspirin EC  81 mg Oral Daily  . Chlorhexidine Gluconate Cloth  6 each Topical Q0600  . metoprolol tartrate  25 mg Oral BID  . rosuvastatin  10 mg Oral Daily   Continuous Infusions: . sodium chloride    . sodium chloride    . heparin 600 Units/hr (10/07/20 1837)     LOS: 2 days    Time spent: 25 mins    Anicka Stuckert, MD [facility]   If 7PM-7AM, please contact night-coverage

## 2020-10-09 NOTE — Progress Notes (Signed)
PT Cancellation Note  Patient Details Name: [name] MRN: [id number] DOB: September 03, 1961   Cancelled Treatment:    Reason Eval/Treat Not Completed: Patient at procedure or test/unavailable, HD. Will check back as time allows.   Leighton Roach, PT  Acute Rehab Services  Pager [phone] Office [place] 10/09/2020, 10:58 AM

## 2020-10-09 NOTE — Procedures (Signed)
Patient seen and examined on Hemodialysis. BP (!) 124/58 (BP Location: Left Arm)   Pulse 70   Temp 98 F (36.7 C) (Oral)   Resp 17   Wt 41.8 kg   SpO2 100%   BMI 17.41 kg/m   QB 400 via AVF UF goal 2.5L  Tolerating treatment without complaints at this time.   Madelon Lips MD [facility] 11:24 AM

## 2020-10-09 NOTE — Progress Notes (Signed)
ANTICOAGULATION CONSULT NOTE - Initial Consult  Pharmacy Consult for Heparin  Indication: chest pain/ACS, also has hx afib  Allergies  Allergen Reactions  . Ampicillin Hives and Rash    Has patient had a PCN reaction causing immediate rash, facial/tongue/throat swelling, SOB or lightheadedness with hypotension: No Has patient had a PCN reaction causing severe rash involving mucus membranes or skin necrosis: No Has patient had a PCN reaction that required hospitalization: No Has patient had a PCN reaction occurring within the last 10 years: No If all of the above answers are "NO", then may proceed with Cephalosporin use.   Marland Kitchen Penicillins Hives and Rash    Has patient had a PCN reaction causing immediate rash, facial/tongue/throat swelling, SOB or lightheadedness with hypotension: Yes Has patient had a PCN reaction causing severe rash involving mucus membranes or skin necrosis: No Has patient had a PCN reaction that required hospitalization: No Has patient had a PCN reaction occurring within the last 10 years: No If all of the above answers are "NO", then may proceed with Cephalosporin use.      Vital Signs: Temp: 98 F (36.7 C) (12/21 1120) Temp Source: Oral (12/21 1120) BP: 124/58 (12/21 1120) Pulse Rate: 70 (12/21 0830)  Labs: Recent Labs    10/06/20 1726 10/06/20 1728 10/06/20 1947 10/06/20 2337 10/07/20 0205 10/07/20 0826 10/07/20 1707 10/08/20 0233 10/08/20 1043 10/09/20 0205 10/09/20 1005  HGB 16.3* 14.7  --   --   --   --   --   --   --  12.4  --   HCT 48.0* 46.8*  --   --   --   --   --   --   --  35.8*  --   PLT  --  170  --   --   --   --   --   --   --  195  --   APTT  --  26  --   --   --   --   --   --   --   --   --   LABPROT  --  12.8  --   --   --   --   --   --   --   --   --   INR  --  1.0  --   --   --   --   --   --   --   --   --   HEPARINUNFRC  --   --   --   --   --   --    < > 0.48 0.44  --  0.45  CREATININE 4.60* 4.75*  --   --   --   --   --    --   --  9.86*  --   TROPONINIHS  --  80*   < > 681* 1,127* 1,480*  --   --   --   --   --    < > = values in this interval not displayed.    CrCl cannot be calculated (Unknown ideal weight.).   Medical History: Past Medical History:  Diagnosis Date  . Anterior cerebral artery aneurysm    1996 AT [facility]  . Coronary atherosclerosis of native coronary artery    STEMI Feb 2009 - 70% distal LAD lesion c/w plaque rupture, DES distal RCA  . Diastolic heart failure    LVEF 60-65% with grade 2 diastolic dysfunction -  July 2014  . End-stage renal disease on hemodialysis ([place]) 02/03/2019  . ESRD (end stage renal disease) on dialysis ([place])   . HTN (hypertension)    Resistant  . Hypercholesterolemia   . Subarachnoid hemorrhage ([place])    03/2012  . Tobacco abuse    Resumed smoking half a pack a day. She was a smoker in the past and states she was abl eto stop in the past using nicotine patches  . Unspecified atrial fibrillation ([place]) 02/03/2019    Assessment: 59 y/o M with altered mental status. Troponin is rising [phone]). Also has hx of afib on no anti-coagulation PTA. Requested to start heparin drip. Dr. Marlowe Sax cleared starting heparin drip with neurology given history of SAH and hx of aneurysm with clips. CBC good. ESRD.   Confirmatory heparin level 0.45 this AM.   Goal of Therapy:  Heparin level 0.3-0.5 units/ml Monitor platelets by anticoagulation protocol: Yes   Plan:  - Will continue heparin drip at 600 units/h  - Recheck heparin level daily - Monitor for s/s of bleeding and Daily CBC/HL  Henryetta Corriveau A. Levada Dy, PharmD, BCPS, FNKF Clinical Pharmacist [place] Please utilize Amion for appropriate phone number to reach the unit pharmacist ([place])

## 2020-10-09 NOTE — Consult Note (Addendum)
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 Hollie Salk Nephrology  History of Present Illness: 59 y/o female with functioning right UE AV fistula that was created by Dr. Donnetta Hutching in 2014.  We have been asked to evaluated the fistula for intervention and prevention of fistula rupture.  Past medical history includes: CAD, ESRD, CHF, HTN, history of rupture cerebral aneurysam s/p clipping 2013, aneurysm coiling x1, aneurysm med rx x 1, VP shunt, diastolic heart failure, embolic MCA CVA [id number] admissions with Afib with RVR and elevated troponin and noncompliance who presented with confusion and Afib with RVR and elevated troponin for which cardiology has been consulted.     Current Facility-Administered Medications  Medication Dose Route Frequency Provider Last Rate Last Admin  .  stroke: mapping our early stages of recovery book   Does not apply Once Shela Leff, MD      . 0.9 %  sodium chloride infusion  100 mL Intravenous PRN Madelon Lips, MD      . 0.9 %  sodium chloride infusion  100 mL Intravenous PRN Madelon Lips, MD      . acetaminophen (TYLENOL) tablet 650 mg  650 mg Oral Q4H PRN Shela Leff, MD       Or  . acetaminophen (TYLENOL) 160 MG/5ML solution 650 mg  650 mg Per Tube Q4H PRN Shela Leff, MD       Or  . acetaminophen (TYLENOL) suppository 650 mg  650 mg Rectal Q4H PRN Shela Leff, MD      . alteplase (CATHFLO ACTIVASE) injection 2 mg  2 mg Intracatheter Once PRN Madelon Lips, MD      . amiodarone (PACERONE) tablet 200 mg  200 mg Oral Daily Shela Leff, MD   200 mg at 10/08/20 0944  . aspirin EC tablet 81 mg  81 mg Oral Daily Shela Leff, MD   81 mg at 10/08/20 0944  . Chlorhexidine Gluconate Cloth 2 % PADS 6 each  6 each Topical Q0600 Madelon Lips, MD   6 each at 10/08/20 1305  . heparin ADULT infusion 100  units/mL (25000 units/24mL sodium chloride 0.45%)  600 Units/hr Intravenous Continuous Duanne Limerick, RPH 6 mL/hr at 10/07/20 1837 600 Units/hr at 10/07/20 1837  . heparin injection 1,000 Units  1,000 Units Dialysis PRN Madelon Lips, MD      . hydrALAZINE (APRESOLINE) injection 5 mg  5 mg Intravenous Q4H PRN Shela Leff, MD   5 mg at 10/07/20 2324  . lidocaine (PF) (XYLOCAINE) 1 % injection 5 mL  5 mL Intradermal PRN Madelon Lips, MD      . lidocaine-prilocaine (EMLA) cream 1 application  1 application Topical PRN Madelon Lips, MD      . metoprolol tartrate (LOPRESSOR) tablet 25 mg  25 mg Oral BID Bethel Born B, MD   25 mg at 10/08/20 2222  . pentafluoroprop-tetrafluoroeth (GEBAUERS) aerosol 1 application  1 application Topical PRN Madelon Lips, MD      . rosuvastatin (CRESTOR) tablet 10 mg  10 mg Oral Daily Bethel Born B, MD   10 mg at 10/08/20 0944    Pt meds include: Statin :No Betablocker: No ASA: Yes Other anticoagulants/antiplatelets: none  Past Medical History:  Diagnosis Date  . Anterior cerebral artery aneurysm    1996 AT [facility]  . Coronary atherosclerosis of native coronary artery    STEMI Feb 2009 - 70% distal LAD lesion  c/w plaque rupture, DES distal RCA  . Diastolic heart failure    LVEF 60-65% with grade 2 diastolic dysfunction - July 2014  . End-stage renal disease on hemodialysis ([place]) 02/03/2019  . ESRD (end stage renal disease) on dialysis ([place])   . HTN (hypertension)    Resistant  . Hypercholesterolemia   . Subarachnoid hemorrhage ([place])    03/2012  . Tobacco abuse    Resumed smoking half a pack a day. She was a smoker in the past and states she was abl eto stop in the past using nicotine patches  . Unspecified atrial fibrillation ([place]) 02/03/2019    Past Surgical History:  Procedure Laterality Date  . ABDOMINAL HYSTERECTOMY    . BASCILIC VEIN TRANSPOSITION Right 05/13/2013   Procedure: [place];  Surgeon: Rosetta Posner, MD;  Location: [place];  Service: Vascular;  Laterality: Right;  . BRAIN SURGERY     2   . LOOP RECORDER IMPLANT N/A 11/14/2013   Procedure: LOOP RECORDER IMPLANT;  Surgeon: Evans Lance, MD;  Location: [facility] CATH LAB;  Service: Cardiovascular;  Laterality: N/A;  . TEE WITHOUT CARDIOVERSION N/A 11/14/2013   Procedure: TRANSESOPHAGEAL ECHOCARDIOGRAM (TEE);  Surgeon: Candee Furbish, MD;  Location: [facility] ENDOSCOPY;  Service: Cardiovascular;  Laterality: N/A;  . VENTRICULOPERITONEAL SHUNT  03/27/2012   Procedure: SHUNT INSERTION VENTRICULAR-PERITONEAL;  Surgeon: Winfield Cunas, MD;  Location: [place] NEURO ORS;  Service: Neurosurgery;  Laterality: N/A;  Ventricular-Peritoneal Shunt Insertion    Social History Social History   Tobacco Use  . Smoking status: Former Smoker    Packs/day: 0.20    Years: 35.00    Pack years: 7.00    Types: Cigarettes    Quit date: 03/20/2017    Years since quitting: 3.5  . Smokeless tobacco: Never Used  . Tobacco comment: 1/2 ppd   Vaping Use  . Vaping Use: Never used  Substance Use Topics  . Alcohol use: No  . Drug use: No    Family History Family History  Problem Relation Age of Onset  . Heart disease Mother   . Heart disease Father   . Coronary artery disease Other        Premature in 1st degree relatives    Allergies  Allergen Reactions  . Ampicillin Hives and Rash    Has patient had a PCN reaction causing immediate rash, facial/tongue/throat swelling, SOB or lightheadedness with hypotension: No Has patient had a PCN reaction causing severe rash involving mucus membranes or skin necrosis: No Has patient had a PCN reaction that required hospitalization: No Has patient had a PCN reaction occurring within the last 10 years: No If all of the above answers are "NO", then may proceed with Cephalosporin use.   Marland Kitchen Penicillins Hives and Rash    Has patient had a PCN reaction causing immediate rash, facial/tongue/throat swelling, SOB or lightheadedness with  hypotension: Yes Has patient had a PCN reaction causing severe rash involving mucus membranes or skin necrosis: No Has patient had a PCN reaction that required hospitalization: No Has patient had a PCN reaction occurring within the last 10 years: No If all of the above answers are "NO", then may proceed with Cephalosporin use.      REVIEW OF SYSTEMS  General: [ ]  Weight loss, [ ]  Fever, [ ]  chills Neurologic: [ ]  Dizziness, [ ]  Blackouts, [ ]  Seizure [ ]  Stroke, [ ]  "Mini stroke", [ ]  Slurred speech, [ ]  Temporary blindness; [ ]  weakness in arms  or legs, [ ]  Hoarseness [ ]  Dysphagia Cardiac: [ ]  Chest pain/pressure, [ ]  Shortness of breath at rest [ ]  Shortness of breath with exertion, [ ]  Atrial fibrillation or irregular heartbeat  Vascular: [ ]  Pain in legs with walking, [ ]  Pain in legs at rest, [ ]  Pain in legs at night,  [ ]  Non-healing ulcer, [ ]  Blood clot in vein/DVT,   Pulmonary: [ ]  Home oxygen, [ ]  Productive cough, [ ]  Coughing up blood, [ ]  Asthma,  [ ]  Wheezing [ ]  COPD Musculoskeletal:  [ ]  Arthritis, [ ]  Low back pain, [ ]  Joint pain Hematologic: [ ]  Easy Bruising, [ ]  Anemia; [ ]  Hepatitis Gastrointestinal: [ ]  Blood in stool, [ ]  Gastroesophageal Reflux/heartburn, Urinary: [ ]  chronic Kidney disease, [x ] on HD - [ ]  MWF or [x ] TTHS, [ ]  Burning with urination, [ ]  Difficulty urinating Skin: [ ]  Rashes, [ ]  Wounds Psychological: [ ]  Anxiety, [ ]  Depression  Physical Examination Vitals:   10/09/20 0930 10/09/20 1000 10/09/20 1030 10/09/20 1120  BP: (!) 120/56 102/72 130/64 (!) 124/58  Pulse:      Resp: 13 15 20 17   Temp:    98 F (36.7 C)  TempSrc:    Oral  SpO2:    100%  Weight:    41.8 kg   Body mass index is 17.41 kg/m.  General:  WDWN in NAD Gait: Normal HENT: WNL Eyes: Pupils equal Pulmonary: normal non-labored breathing , without Rales, rhonchi,  wheezing Cardiac: RRR, without  Murmurs, rubs or gallops; No carotid bruits Abdomen: soft, NT, no  masses Skin: no rashes, ulcers noted;  no Gangrene , no cellulitis; no open wounds;   Vascular Exam/Pulses:Palpable radial pulse      Musculoskeletal: no muscle wasting or atrophy; no edema  Neurologic: A&O X 3; Appropriate Affect ;  SENSATION: normal; MOTOR FUNCTION: 5/5 Symmetric Speech is fluent/normal   Significant Diagnostic Studies: CBC Lab Results  Component Value Date   WBC 4.5 10/09/2020   HGB 12.4 10/09/2020   HCT 35.8 (L) 10/09/2020   MCV 86.7 10/09/2020   PLT 195 10/09/2020    BMET    Component Value Date/Time   NA 127 (L) 10/09/2020 0205   K 4.4 10/09/2020 0205   CL 83 (L) 10/09/2020 0205   CO2 24 10/09/2020 0205   GLUCOSE 85 10/09/2020 0205   BUN 62 (H) 10/09/2020 0205   CREATININE 9.86 (H) 10/09/2020 0205   CALCIUM 9.6 10/09/2020 0205   GFRNONAA 4 (L) 10/09/2020 0205   GFRAA 5 (L) 02/03/2019 7564   CrCl cannot be calculated (Unknown ideal weight.).  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  I would avoid the aneurysmal sites during HD.  We will plan plication of the distal ulcerated area.  We will try to preserve the fistula for continued use.     Roxy Horseman 10/09/2020 11:33 AM  I agree with the above.  I have seen and evaluate the patient.  We will plan on repair of her fistula Thursday.  Annamarie Major

## 2020-10-09 NOTE — Progress Notes (Signed)
Physical Therapy Treatment Patient Details Name: [name] MRN: [id number] DOB: 1960/12/14 Today's Date: 10/09/2020    History of Present Illness This 59 y.o. female with PMH significant for ESRD on HD, T2DM, HLD, CAD with stents, chronic diastolic CHF, A. 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  Patient was brought to the ED on 12/18 from dialysis center for evaluation of decreased responsiveness.  Pt admitted with acute encephalopathy, afib with RVR, and elevated troponin/NSTEMI with cardio consulted.    PT Comments    Pt progressing with ambulation distance and activity tolerance, ambulating 300' with RW and min A with periods of min-guard. Worked on path finding around unit which pt struggled with due to significant STM deficits. Pt will need 24 hr supervision for safety but if this is available has functional strength and mobility for home. PT will continue to follow.   Follow Up Recommendations  Supervision/Assistance - 24 hour;Home health PT     Equipment Recommendations  None recommended by PT    Recommendations for Other Services       Precautions / Restrictions Precautions Precautions: Fall Restrictions Weight Bearing Restrictions: No    Mobility  Bed Mobility Overal bed mobility: Needs Assistance Bed Mobility: Supine to Sit     Supine to sit: Min guard     General bed mobility comments: Cues for sequence and to complete transfer  Transfers Overall transfer level: Needs assistance Equipment used: Rolling walker (2 wheeled) Transfers: Sit to/from Stand Sit to Stand: Min guard         General transfer comment: min-guard for safety  Ambulation/Gait Ambulation/Gait assistance: Min assist;Min guard Gait Distance (Feet): 300 Feet Assistive device: Rolling walker (2 wheeled) Gait Pattern/deviations: Step-through pattern;Decreased stride length;Trunk flexed Gait velocity:  decreased Gait velocity interpretation: <1.8 ft/sec, indicate of risk for recurrent falls General Gait Details: worked on path finding on unit which pt struggled with. Pt maintained trunk flexion until RW lowered which improved posture partially.   Stairs             Wheelchair Mobility    Modified Rankin (Stroke Patients Only)       Balance Overall balance assessment: Needs assistance Sitting-balance support: No upper extremity supported Sitting balance-Leahy Scale: Good     Standing balance support: Bilateral upper extremity supported Standing balance-Leahy Scale: Poor Standing balance comment: requires UE support                            Cognition Arousal/Alertness: Awake/alert Behavior During Therapy: WFL for tasks assessed/performed Overall Cognitive Status: No family/caregiver present to determine baseline cognitive functioning Area of Impairment: Orientation;Following commands;Safety/judgement;Awareness;Problem solving;Memory                 Orientation Level: Disoriented to;Time;Situation   Memory: Decreased short-term memory Following Commands: Follows one step commands consistently;Follows multi-step commands inconsistently Safety/Judgement: Decreased awareness of safety;Decreased awareness of deficits   Problem Solving: Slow processing General Comments: pt can remember that she went to HD this morning but not the last time she ate      Exercises      General Comments General comments (skin integrity, edema, etc.): pt distracted by getting food ordered after return to room so further exercises pended      Pertinent Vitals/Pain Pain Assessment: Faces Faces Pain Scale: No hurt    Home Living  Prior Function            PT Goals (current goals can now be found in the care plan section) Acute Rehab PT Goals Patient Stated Goal: eat dinner PT Goal Formulation: With patient Time For Goal  Achievement: 10/22/20 Potential to Achieve Goals: Good Progress towards PT goals: Progressing toward goals    Frequency    Min 3X/week      PT Plan Current plan remains appropriate    Co-evaluation              AM-PAC PT "6 Clicks" Mobility   Outcome Measure  Help needed turning from your back to your side while in a flat bed without using bedrails?: A Little Help needed moving from lying on your back to sitting on the side of a flat bed without using bedrails?: A Little Help needed moving to and from a bed to a chair (including a wheelchair)?: A Little Help needed standing up from a chair using your arms (e.g., wheelchair or bedside chair)?: A Little Help needed to walk in hospital room?: A Little Help needed climbing 3-5 steps with a railing? : A Little 6 Click Score: 18    End of Session Equipment Utilized During Treatment: Gait belt Activity Tolerance: Patient tolerated treatment well Patient left: with chair alarm set;in chair;with call bell/phone within reach Nurse Communication: Mobility status (help ordering dinner) PT Visit Diagnosis: Unsteadiness on feet (R26.81)     Time: 1553-1610 PT Time Calculation (min) (ACUTE ONLY): 17 min  Charges:  $Gait Training: 8-22 mins                     Leighton Roach, [place]  Pager [phone] Office [place] 10/09/2020, 5:12 PM

## 2020-10-10 DIAGNOSIS — E46 Unspecified protein-calorie malnutrition: Secondary | ICD-10-CM

## 2020-10-10 LAB — CBC
HCT: 37 % (ref 36.0–46.0)
Hemoglobin: 12.2 g/dL (ref 12.0–15.0)
MCH: 28.9 pg (ref 26.0–34.0)
MCHC: 33 g/dL (ref 30.0–36.0)
MCV: 87.7 fL (ref 80.0–100.0)
Platelets: 185 10*3/uL (ref 150–400)
RBC: 4.22 MIL/uL (ref 3.87–5.11)
RDW: 14.7 % (ref 11.5–15.5)
WBC: 4.8 10*3/uL (ref 4.0–10.5)
nRBC: 0 % (ref 0.0–0.2)

## 2020-10-10 LAB — COMPREHENSIVE METABOLIC PANEL
ALT: 13 U/L (ref 0–44)
AST: 21 U/L (ref 15–41)
Albumin: 3.3 g/dL — ABNORMAL LOW (ref 3.5–5.0)
Alkaline Phosphatase: 55 U/L (ref 38–126)
Anion gap: 17 — ABNORMAL HIGH (ref 5–15)
BUN: 37 mg/dL — ABNORMAL HIGH (ref 6–20)
CO2: 24 mmol/L (ref 22–32)
Calcium: 9.3 mg/dL (ref 8.9–10.3)
Chloride: 91 mmol/L — ABNORMAL LOW (ref 98–111)
Creatinine, Ser: 6.37 mg/dL — ABNORMAL HIGH (ref 0.44–1.00)
GFR, Estimated: 7 mL/min — ABNORMAL LOW (ref >60)
Glucose, Bld: 107 mg/dL — ABNORMAL HIGH (ref 70–99)
Potassium: 4.5 mmol/L (ref 3.5–5.1)
Sodium: 132 mmol/L — ABNORMAL LOW (ref 135–145)
Total Bilirubin: 0.6 mg/dL (ref 0.3–1.2)
Total Protein: 6.4 g/dL — ABNORMAL LOW (ref 6.5–8.1)

## 2020-10-10 LAB — GLUCOSE, CAPILLARY
Glucose-Capillary: 117 mg/dL — ABNORMAL HIGH (ref 70–99)
Glucose-Capillary: 82 mg/dL (ref 70–99)
Glucose-Capillary: 109 mg/dL — ABNORMAL HIGH (ref 70–99)
Glucose-Capillary: 99 mg/dL (ref 70–99)
Glucose-Capillary: 117 mg/dL — ABNORMAL HIGH (ref 70–99)
Glucose-Capillary: 143 mg/dL — ABNORMAL HIGH (ref 70–99)

## 2020-10-10 LAB — HEPARIN LEVEL (UNFRACTIONATED): Heparin Unfractionated: <0.1 IU/mL — ABNORMAL LOW (ref 0.30–0.70)

## 2020-10-10 LAB — PHOSPHORUS: Phosphorus: 7.8 mg/dL — ABNORMAL HIGH (ref 2.5–4.6)

## 2020-10-10 LAB — MAGNESIUM: Magnesium: 2.2 mg/dL (ref 1.7–2.4)

## 2020-10-10 NOTE — Progress Notes (Signed)
  [facility] Progress Note   Assessment/ Plan:   Dialysis Orders:  TTS - [facility]  4hrs, BFR 400, DFR 800,  EDW 37kg, 2K/ 2Ca  Access: RU AVF  Heparin none Sensipar 120mg  qHD Calcitriol 14mcg qHD  Assessment/Plan: 1.  AMS - CT head/neck and CXR negative for acute process.  EEG negative. Improving, appears close to her normal baseline at dialysis. Wkup per admit.  2. Rising troponin/NSTEMI - Known Hx CAD.  TTE with preserved function. Not a good candidate for cath per cardio, s/p heparin drip x 48hrs (12/19-12/21) - cleared by neruo.  Plan for medical management at this time. Per cardio 3. A fib with RVR - converted back to NSR.  On amiodarone and metoprolol 25 mg BID. Per cardio 4.  ESRD -  On HD TTS. Plan for HD 12/23 per regular schedule.  5.  Hypertension/volume  - BP elevated, continue home meds. Euvolemic on exam.  UF to dry weight.  6.  Anemia of CKD - Hgb 14.7. No indication for ESA. 7.  Secondary Hyperparathyroidism -  Ca in goal. Will check phos. Continue VDRA, sensipar and binders 8.  Nutrition - Renal diet w/fluid restrictions. 9. Seizure disorder - EEG negative 10. Hx ICH x2 with VP shunt placement - stable on CT. Per neuro 11. Dementia 12. Access: AVF is aneurysmal with thinned skin and eschar, no active bleeding.  Appreciate VVS evaluation    Subjective:    Sleeping, no acute overnight events noted.      Objective:   BP (P) 137/64 (BP Location: Left Arm)   Pulse (P) 69   Temp (P) 98.1 F (36.7 C) (Oral)   Resp (P) 18   Wt 41.8 kg   SpO2 (P) 100%   BMI 17.41 kg/m   Physical Exam: Gen: NAD, sitting on HD CVS: RRR Resp: clear Abd: abd Ext: no LE edema ACCESS: R AVF aneurysmal, some thinned skin, some eschar, no active bleeding, pressure dressing removed.  Labs: BMET Recent Labs  Lab 10/06/20 1726 10/06/20 1728 10/09/20 0205 10/10/20 0301  NA 137 141 127* 132*  K 4.5 4.0 4.4 4.5  CL 95* 90* 83* 91*  CO2  --  25 24 24   GLUCOSE 176*  186* 85 107*  BUN 29* 20 62* 37*  CREATININE 4.60* 4.75* 9.86* 6.37*  CALCIUM  --  9.1 9.6 9.3  PHOS  --   --  10.1* 7.8*   CBC Recent Labs  Lab 10/06/20 1726 10/06/20 1728 10/09/20 0205 10/10/20 0301  WBC  --  8.2 4.5 4.8  NEUTROABS  --  6.1  --   --   HGB 16.3* 14.7 12.4 12.2  HCT 48.0* 46.8* 35.8* 37.0  MCV  --  92.9 86.7 87.7  PLT  --  170 195 185      Medications:    .  stroke: mapping our early stages of recovery book   Does not apply Once  . amiodarone  200 mg Oral Daily  . aspirin EC  81 mg Oral Daily  . Chlorhexidine Gluconate Cloth  6 each Topical Q0600  . metoprolol tartrate  25 mg Oral BID  . rosuvastatin  10 mg Oral Daily     Madelon Lips MD 10/10/2020, 11:02 AM

## 2020-10-10 NOTE — Progress Notes (Signed)
Occupational Therapy Treatment Patient Details Name: [name] MRN: [id number] DOB: [birth year] Today's Date: 10/10/2020    History of present illness This 59 y.o. female with PMH significant for ESRD on HD, T2DM, HLD, CAD with stents, chronic diastolic CHF, A. 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  Patient was brought to the ED on 12/18 from dialysis center for evaluation of decreased responsiveness.  Pt admitted with acute encephalopathy, afib with RVR, and elevated troponin/NSTEMI with cardio consulted.   OT comments  Pt demonstrates basic transfer with RW min Guard (A) with cues for safety to not leave RW. Pt completed sink level grooming task with min (A). Pt demonstrates need to cues to sequence oral care. Recommendation for d/c home with family.    Follow Up Recommendations  No OT follow up    Equipment Recommendations  None recommended by OT    Recommendations for Other Services      Precautions / Restrictions Precautions Precautions: Fall       Mobility Bed Mobility Overal bed mobility: Needs Assistance Bed Mobility: Sit to Supine       Sit to supine: Supervision      Transfers Overall transfer level: Needs assistance Equipment used: Rolling walker (2 wheeled) Transfers: Sit to/from Stand Sit to Stand: Min guard         General transfer comment: min-guard for safety    Balance Overall balance assessment: Needs assistance         Standing balance support: Single extremity supported;During functional activity Standing balance-Leahy Scale: Fair                             ADL either performed or assessed with clinical judgement   ADL Overall ADL's : Needs assistance/impaired Eating/Feeding: Set up   Grooming: Minimal assistance;Standing Grooming Details (indicate cue type and reason): requires application of paste then able to brush teeth     Lower  Body Bathing: Maximal assistance;Sit to/from stand   Upper Body Dressing : Minimal assistance;Sitting       Toilet Transfer: Min guard;Ambulation;Regular Toilet;RW           Functional mobility during ADLs: Min guard;Rolling walker       Vision       Perception     Praxis      Cognition Arousal/Alertness: Awake/alert Behavior During Therapy: WFL for tasks assessed/performed Overall Cognitive Status: No family/caregiver present to determine baseline cognitive functioning                         Following Commands: Follows one step commands consistently Safety/Judgement: Decreased awareness of safety;Decreased awareness of deficits   Problem Solving: Slow processing General Comments: pt reports needing help with oral care. pt reports completing task that she didnt have adl items in room to complete until OT arrival        Exercises     Shoulder Instructions       General Comments      Pertinent Vitals/ Pain       Pain Assessment: Faces Faces Pain Scale: No hurt  Home Living                                          Prior Functioning/Environment  Frequency  Min 1X/week        Progress Toward Goals  OT Goals(current goals can now be found in the care plan section)  Progress towards OT goals: Progressing toward goals  Acute Rehab OT Goals Patient Stated Goal: to go back to bed OT Goal Formulation: With patient Time For Goal Achievement: 10/22/20 Potential to Achieve Goals: Good ADL Goals Pt Will Perform Lower Body Dressing: with modified independence Pt Will Transfer to Toilet: with modified independence  Plan Discharge plan needs to be updated    Co-evaluation                 AM-PAC OT "6 Clicks" Daily Activity     Outcome Measure   Help from another person eating meals?: A Little Help from another person taking care of personal grooming?: A Little Help from another person toileting,  which includes using toliet, bedpan, or urinal?: A Little Help from another person bathing (including washing, rinsing, drying)?: A Little Help from another person to put on and taking off regular upper body clothing?: A Little Help from another person to put on and taking off regular lower body clothing?: A Little 6 Click Score: 18    End of Session Equipment Utilized During Treatment: Rolling walker  OT Visit Diagnosis: Unsteadiness on feet (R26.81)   Activity Tolerance Patient tolerated treatment well   Patient Left in bed;with call bell/phone within reach;with bed alarm set   Nurse Communication Mobility status;Precautions        Time: 4996-9249 OT Time Calculation (min): 14 min  Charges: OT General Charges $OT Visit: 1 Visit OT Treatments $Self Care/Home Management : 8-22 mins   Brynn, OTR/L  Acute Rehabilitation Services Pager: [phone] Office: [phone] .'    Jeri Modena 10/10/2020, 2:51 PM

## 2020-10-10 NOTE — Progress Notes (Signed)
    Subjective    Denies pain or bleeding from her fistula   Physical Exam:  Ectatic right arm fistula with 2 areas of aneurysmal change with overlying skin ulceration         Assessment/Plan:    Aneurysmal and ulcerated right arm fistula.  I discussed proceeding with surgical correction.  We discussed plication of both aneurysms and resection of the necrotic skin.  I told her that she should have enough room proximal and distal to the aneurysmal segments in her fistula to continue using her fistula for dialysis.  I have discussed the case with anesthesia given her recent cardiac issues.  This will likely be done under regional anesthesia.  She will be n.p.o. after midnight.  All questions were answered.  [name] 10/10/2020 7:13 PM --  Vitals:   10/10/20 1156 10/10/20 1620  BP: 129/70 130/63  Pulse: 63 63  Resp: 16 14  Temp: 98 F (36.7 C) 98.2 F (36.8 C)  SpO2: 100% 100%    Intake/Output Summary (Last 24 hours) at 10/10/2020 1913 Last data filed at 10/10/2020 1700 Gross per 24 hour  Intake 650 ml  Output --  Net 650 ml     Laboratory CBC    Component Value Date/Time   WBC 4.8 10/10/2020 0301   HGB 12.2 10/10/2020 0301   HCT 37.0 10/10/2020 0301   PLT 185 10/10/2020 0301    BMET    Component Value Date/Time   NA 132 (L) 10/10/2020 0301   K 4.5 10/10/2020 0301   CL 91 (L) 10/10/2020 0301   CO2 24 10/10/2020 0301   GLUCOSE 107 (H) 10/10/2020 0301   BUN 37 (H) 10/10/2020 0301   CREATININE 6.37 (H) 10/10/2020 0301   CALCIUM 9.3 10/10/2020 0301   GFRNONAA 7 (L) 10/10/2020 0301   GFRAA 5 (L) 02/03/2019 0652    COAG Lab Results  Component Value Date   INR 1.0 10/06/2020   INR 1.48 09/25/2013   INR 0.99 05/18/2013   No results found for: PTT  Antibiotics Anti-infectives (From admission, onward)   None       V. Leia Alf, M.D., [facility] [place] Office: [phone] Pager:  [phone]

## 2020-10-10 NOTE — Progress Notes (Signed)
PROGRESS NOTE  [name] OEU:[id number] DOB: 12-27-1960 DOA: 10/06/2020 PCP: Garwin Brothers, MD  HPI/Recap of past 9 hours: 59 year old female past medical history significant for end-stage renal disease on hemodialysis, diabetes mellitus, chronic diastolic heart failure, CAD status post stent, CVA, A. 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  Code stroke activated but by time patient was seen by neurology, symptoms have resolved.  CT negative for acute findings.  No evidence of occlusion on CTA.  She was admitted at that time for altered mental status which is since resolved.  She was found to have an elevated troponin felt to possibly be from a non-STEMI and cardiology recommended heparin for 48 hours followed by medical management only.  Given her intracranial hemorrhage and poor compliance issues, patient felt not to be a good candidate for a left heart catheterization.  Assessment/Plan: Principal Problem:   AMS (altered mental status)/acute encephalopathy: Possibly related to fluid shifts and electrolytes during dialysis.  Seen by neurology.  CVA ruled out.  No evidence of occlusion. Active Problems:   Mixed hyperlipidemia: Continue Crestor.    ESRD (end stage renal disease) ([place]): Did not complete dialysis on 12/18 because of her symptoms.  Seen by nephrology.  Next hemodialysis planned for 12/23.  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  Vascular surgery to see.   HTN (hypertension): Blood pressure elevated and continued on home meds.  Managing some of this with filtration.    Atrial fibrillation with rapid ventricular response ([place]): Converted to sinus rhythm with Cardizem and amiodarone.  Now on metoprolol.  Heart rate better controlled.  Appreciate cardiology consult.    Elevated  troponin: Possibly from non-STEMI.  History of CAD with stents.  Started on heparin for 48 hours.  Stopped after 48 hours not a good candidate for long-term anticoagulation given intracranial hemorrhage.  Active candidate for cardiac cath given her intracranial hemorrhage as well as with poor compliance issues.   Code Status: Full code  Family Communication: Left message for family  Disposition Plan: Depending on vascular surgery recommendations.  PT recommending home health physical therapy   Consultants:  Vascular surgery  Nephrology  Cardiology  Neurology  Procedures:  Hemodialysis  Echocardiogram done 4/19: No evidence of valvular dysfunction.  Diastolic function indeterminate.  Preserved ejection fraction  Antimicrobials:  None  DVT prophylaxis: Was on heparin, now SCDs   Objective: Vitals:   10/10/20 0800 10/10/20 1156  BP: 137/64 129/70  Pulse: 69 63  Resp: 18 16  Temp: 98.1 F (36.7 C) 98 F (36.7 C)  SpO2: 100% 100%    Intake/Output Summary (Last 24 hours) at 10/10/2020 1617 Last data filed at 10/10/2020 1300 Gross per 24 hour  Intake 290 ml  Output --  Net 290 ml   Filed Weights   10/08/20 0504 10/09/20 0740 10/09/20 1120  Weight: 42.6 kg 43.5 kg 41.8 kg   Body mass index is 17.41 kg/m.  Exam:   General: Fatigued, resting comfortably, alert and oriented x3  HEENT: Cephalic atraumatic, mucous membranes are slightly dry  Cardiovascular: Regular rate and rhythm, S1-S2  Respiratory: Clear to auscultation bilaterally  Abdomen: Soft, nontender, nondistended, positive bowel sounds  Musculoskeletal: No clubbing or cyanosis or edema  Skin: Fistula noted, no skin breaks, tears or lesions otherwise  Psychiatry: Appears appropriate, no evidence of  psychoses   Data Reviewed: CBC: Recent Labs  Lab 10/06/20 1726 10/06/20 1728 10/09/20 0205 10/10/20 0301  WBC  --  8.2 4.5 4.8  NEUTROABS  --  6.1  --   --   HGB 16.3* 14.7 12.4 12.2   HCT 48.0* 46.8* 35.8* 37.0  MCV  --  92.9 86.7 87.7  PLT  --  170 195 732   Basic Metabolic Panel: Recent Labs  Lab 10/06/20 1726 10/06/20 1728 10/09/20 0205 10/10/20 0301  NA 137 141 127* 132*  K 4.5 4.0 4.4 4.5  CL 95* 90* 83* 91*  CO2  --  25 24 24   GLUCOSE 176* 186* 85 107*  BUN 29* 20 62* 37*  CREATININE 4.60* 4.75* 9.86* 6.37*  CALCIUM  --  9.1 9.6 9.3  MG  --   --  2.4 2.2  PHOS  --   --  10.1* 7.8*   GFR: CrCl cannot be calculated (Unknown ideal weight.). Liver Function Tests: Recent Labs  Lab 10/06/20 1728 10/10/20 0301  AST 18 21  ALT 12 13  ALKPHOS 61 55  BILITOT 0.3 0.6  PROT 8.3* 6.4*  ALBUMIN 4.0 3.3*   No results for input(s): LIPASE, AMYLASE in the last 168 hours. Recent Labs  Lab 10/07/20 0205  AMMONIA 18   Coagulation Profile: Recent Labs  Lab 10/06/20 1728  INR 1.0   Cardiac Enzymes: No results for input(s): CKTOTAL, CKMB, CKMBINDEX, TROPONINI in the last 168 hours. BNP (last 3 results) No results for input(s): PROBNP in the last 8760 hours. HbA1C: No results for input(s): HGBA1C in the last 72 hours. CBG: Recent Labs  Lab 10/09/20 2033 10/09/20 2340 10/10/20 0333 10/10/20 0751 10/10/20 1158  GLUCAP 108* 133* 117* 82 109*   Lipid Profile: No results for input(s): CHOL, HDL, LDLCALC, TRIG, CHOLHDL, LDLDIRECT in the last 72 hours. Thyroid Function Tests: No results for input(s): TSH, T4TOTAL, FREET4, T3FREE, THYROIDAB in the last 72 hours. Anemia Panel: No results for input(s): VITAMINB12, FOLATE, FERRITIN, TIBC, IRON, RETICCTPCT in the last 72 hours. Urine analysis:    Component Value Date/Time   COLORURINE YELLOW 11/08/2013 [place] 11/08/2013 1304   LABSPEC 1.009 11/08/2013 1304   PHURINE 7.5 11/08/2013 1304   GLUCOSEU NEGATIVE 11/08/2013 1304   HGBUR NEGATIVE 11/08/2013 1304   BILIRUBINUR NEGATIVE 11/08/2013 1304   KETONESUR NEGATIVE 11/08/2013 1304   PROTEINUR NEGATIVE 11/08/2013 1304    UROBILINOGEN 0.2 11/08/2013 1304   NITRITE NEGATIVE 11/08/2013 1304   LEUKOCYTESUR NEGATIVE 11/08/2013 1304   Sepsis Labs: @LABRCNTIP (procalcitonin:4,lacticidven:4)  ) Recent Results (from the past 240 hour(s))  Resp Panel by RT-PCR (Flu A&B, Covid) Nasopharyngeal Swab     Status: None   Collection Time: 10/06/20  5:25 PM   Specimen: Nasopharyngeal Swab; Nasopharyngeal(NP) swabs in vial transport medium  Result Value Ref Range Status   SARS Coronavirus 2 by RT PCR NEGATIVE NEGATIVE Final    Comment: (NOTE) SARS-CoV-2 target nucleic acids are NOT DETECTED.  The SARS-CoV-2 RNA is generally detectable in upper respiratory specimens during the acute phase of infection. The lowest concentration of SARS-CoV-2 viral copies this assay can detect is 138 copies/mL. A negative result does not preclude SARS-Cov-2 infection and should not be used as the sole basis for treatment or other patient management decisions. A negative result may occur with  improper specimen collection/handling, submission of specimen other than nasopharyngeal swab, presence of viral mutation(s) within the areas targeted by this assay, and inadequate number of viral  copies(<138 copies/mL). A negative result must be combined with clinical observations, patient history, and epidemiological information. The expected result is Negative.  Fact Sheet for Patients:  EntrepreneurPulse.com.au  Fact Sheet for Healthcare Providers:  IncredibleEmployment.be  This test is no t yet approved or cleared by the [place] FDA and  has been authorized for detection and/or diagnosis of SARS-CoV-2 by FDA under an Emergency Use Authorization (EUA). This EUA will remain  in effect (meaning this test can be used) for the duration of the COVID-19 declaration under Section 564(b)(1) of the Act, 21 U.S.C.section 360bbb-3(b)(1), unless the authorization is terminated  or revoked sooner.        Influenza A by PCR NEGATIVE NEGATIVE Final   Influenza B by PCR NEGATIVE NEGATIVE Final    Comment: (NOTE) The Xpert Xpress SARS-CoV-2/FLU/RSV plus assay is intended as an aid in the diagnosis of influenza from Nasopharyngeal swab specimens and should not be used as a sole basis for treatment. Nasal washings and aspirates are unacceptable for Xpert Xpress SARS-CoV-2/FLU/RSV testing.  Fact Sheet for Patients: EntrepreneurPulse.com.au  Fact Sheet for Healthcare Providers: IncredibleEmployment.be  This test is not yet approved or cleared by the [place] FDA and has been authorized for detection and/or diagnosis of SARS-CoV-2 by FDA under an Emergency Use Authorization (EUA). This EUA will remain in effect (meaning this test can be used) for the duration of the COVID-19 declaration under Section 564(b)(1) of the Act, 21 U.S.C. section 360bbb-3(b)(1), unless the authorization is terminated or revoked.  Performed at [facility], [place] [address] [place], [place] [address]       Studies: No results found.  Scheduled Meds: .  stroke: mapping our early stages of recovery book   Does not apply Once  . amiodarone  200 mg Oral Daily  . aspirin EC  81 mg Oral Daily  . Chlorhexidine Gluconate Cloth  6 each Topical Q0600  . metoprolol tartrate  25 mg Oral BID  . rosuvastatin  10 mg Oral Daily    Continuous Infusions: . sodium chloride    . sodium chloride       LOS: 3 days     Annita Brod, MD [facility]   10/10/2020, 4:17 PM

## 2020-10-11 ENCOUNTER — Inpatient Hospital Stay (HOSPITAL_COMMUNITY): Payer: 59 | Admitting: Anesthesiology

## 2020-10-11 ENCOUNTER — Encounter (HOSPITAL_COMMUNITY)
Admission: EM | Disposition: A | Discharge status: Home / Self Care | Payer: Self-pay | Source: Ambulatory Visit | Attending: Internal Medicine

## 2020-10-11 ENCOUNTER — Encounter (HOSPITAL_COMMUNITY): Payer: Self-pay | Admitting: Internal Medicine

## 2020-10-11 LAB — GLUCOSE, CAPILLARY
Glucose-Capillary: 107 mg/dL — ABNORMAL HIGH (ref 70–99)
Glucose-Capillary: 101 mg/dL — ABNORMAL HIGH (ref 70–99)
Glucose-Capillary: 88 mg/dL (ref 70–99)
Glucose-Capillary: 150 mg/dL — ABNORMAL HIGH (ref 70–99)
Glucose-Capillary: 98 mg/dL (ref 70–99)
Glucose-Capillary: 95 mg/dL (ref 70–99)

## 2020-10-11 LAB — MRSA PCR SCREENING: MRSA by PCR: NEGATIVE

## 2020-10-11 SURGERY — REVISON OF ARTERIOVENOUS FISTULA
Anesthesia: Choice | Laterality: Right

## 2020-10-11 MED ORDER — MIDAZOLAM HCL 2 MG/2ML IJ SOLN
INTRAMUSCULAR | Status: AC
  Filled 2020-10-11: qty 2

## 2020-10-11 MED ORDER — LIDOCAINE 2% (20 MG/ML) 5 ML SYRINGE
INTRAMUSCULAR | Status: AC
  Filled 2020-10-11: qty 10

## 2020-10-11 MED ORDER — ONDANSETRON HCL 4 MG/2ML IJ SOLN
INTRAMUSCULAR | Status: AC
  Filled 2020-10-11: qty 2

## 2020-10-11 MED ORDER — PROSOURCE PLUS PO LIQD
30.0000 mL | Freq: Two times a day (BID) | ORAL | Status: DC
  Administered 2020-10-11 – 2020-10-17 (×4): 30 mL via ORAL
  Filled 2020-10-11 (×13): qty 30

## 2020-10-11 MED ORDER — SODIUM CHLORIDE 0.9 % IV SOLN: INTRAVENOUS | Status: DC

## 2020-10-11 MED ORDER — PHENYLEPHRINE 40 MCG/ML (10ML) SYRINGE FOR IV PUSH (FOR BLOOD PRESSURE SUPPORT)
PREFILLED_SYRINGE | INTRAVENOUS | Status: AC
  Filled 2020-10-11: qty 10

## 2020-10-11 MED ORDER — DEXAMETHASONE SODIUM PHOSPHATE 10 MG/ML IJ SOLN
INTRAMUSCULAR | Status: AC
  Filled 2020-10-11: qty 1

## 2020-10-11 MED ORDER — CHLORHEXIDINE GLUCONATE 0.12 % MT SOLN
15.0000 mL | OROMUCOSAL | Status: AC
  Administered 2020-10-11: 08:00:00 15 mL via OROMUCOSAL
  Filled 2020-10-11: qty 15

## 2020-10-11 MED ORDER — RENA-VITE PO TABS
1.0000 | ORAL_TABLET | Freq: Every day | ORAL | Status: DC
  Administered 2020-10-11 – 2020-10-16 (×6): 1 via ORAL
  Filled 2020-10-11 (×6): qty 1

## 2020-10-11 MED ORDER — NEPRO/CARBSTEADY PO LIQD
237.0000 mL | Freq: Two times a day (BID) | ORAL | Status: DC
  Administered 2020-10-11: 16:00:00 237 mL via ORAL

## 2020-10-11 MED ORDER — FENTANYL CITRATE (PF) 250 MCG/5ML IJ SOLN
INTRAMUSCULAR | Status: AC
  Filled 2020-10-11: qty 5

## 2020-10-11 MED ORDER — PROPOFOL 10 MG/ML IV BOLUS
INTRAVENOUS | Status: AC
  Filled 2020-10-11: qty 20

## 2020-10-11 NOTE — Progress Notes (Signed)
  [facility] Progress Note   Assessment/ Plan:   Dialysis Orders:  TTS - [facility]  4hrs, BFR 400, DFR 800,  EDW 37kg, 2K/ 2Ca  Access: RU AVF  Heparin none Sensipar 120mg  qHD Calcitriol 64mcg qHD  Assessment/Plan: 1.  AMS - CT head/neck and CXR negative for acute process.  EEG negative. Improving, appears close to her normal baseline at dialysis. Wkup per admit.  2. Rising troponin/NSTEMI - Known Hx CAD.  TTE with preserved function. Not a good candidate for cath per cardio, s/p heparin drip x 48hrs (12/19-12/21) - cleared by neruo.  Plan for medical management at this time. Per cardio 3. A fib with RVR - converted back to NSR.  On amiodarone and metoprolol 25 mg BID. Per cardio 4.  ESRD -  On HD TTS. Plan for HD 12/23 per regular schedule.  5.  Hypertension/volume  - BP elevated, continue home meds. Euvolemic on exam.  UF to dry weight.  6.  Anemia of CKD - Hgb 14.7. No indication for ESA. 7.  Secondary Hyperparathyroidism -  Ca in goal. Will check phos. Continue VDRA, sensipar and binders 8.  Nutrition - Renal diet w/fluid restrictions. 9. Seizure disorder - EEG negative 10. Hx ICH x2 with VP shunt placement - stable on CT. Per neuro 11. Dementia 12. Access: AVF is aneurysmal with thinned skin and eschar, no active bleeding.  Appreciate VVS evaluation- for revision when consent obtained.   Subjective:    AVF revision postponed d/t unavailability of family members to give consent for surgery.        Objective:   BP (!) 150/54 (BP Location: Left Arm)   Pulse 69   Temp 98.4 F (36.9 C) (Oral)   Resp 17   Wt 43 kg   SpO2 100%   BMI 17.91 kg/m   Physical Exam: Gen: NAD, sitting in bed CVS: RRR Resp: clear Abd: abd Ext: no LE edema ACCESS: R AVF aneurysmal, some thinned skin, eschar s/p removal no active bleeding.  Pt says it hurts  Labs: BMET Recent Labs  Lab 10/06/20 1726 10/06/20 1728 10/09/20 0205 10/10/20 0301  NA 137 141 127* 132*  K 4.5 4.0  4.4 4.5  CL 95* 90* 83* 91*  CO2  --  25 24 24   GLUCOSE 176* 186* 85 107*  BUN 29* 20 62* 37*  CREATININE 4.60* 4.75* 9.86* 6.37*  CALCIUM  --  9.1 9.6 9.3  PHOS  --   --  10.1* 7.8*   CBC Recent Labs  Lab 10/06/20 1726 10/06/20 1728 10/09/20 0205 10/10/20 0301  WBC  --  8.2 4.5 4.8  NEUTROABS  --  6.1  --   --   HGB 16.3* 14.7 12.4 12.2  HCT 48.0* 46.8* 35.8* 37.0  MCV  --  92.9 86.7 87.7  PLT  --  170 195 185      Medications:    .  stroke: mapping our early stages of recovery book   Does not apply Once  . amiodarone  200 mg Oral Daily  . aspirin EC  81 mg Oral Daily  . Chlorhexidine Gluconate Cloth  6 each Topical Q0600  . metoprolol tartrate  25 mg Oral BID  . rosuvastatin  10 mg Oral Daily     Madelon Lips MD 10/11/2020, 2:02 PM

## 2020-10-11 NOTE — Anesthesia Preprocedure Evaluation (Deleted)
Anesthesia Evaluation  Patient identified by MRN, date of birth, ID band Patient confused    Reviewed: Allergy & Precautions, NPO status , Patient's Chart, lab work & pertinent test results  Airway Mallampati: I  TM Distance: >3 FB Neck ROM: Full    Dental   Pulmonary former smoker,    Pulmonary exam normal        Cardiovascular hypertension, Pt. on medications + CAD and + Peripheral Vascular Disease  Normal cardiovascular exam+ dysrhythmias Atrial Fibrillation      Neuro/Psych    GI/Hepatic   Endo/Other    Renal/GU ESRF and DialysisRenal disease     Musculoskeletal   Abdominal   Peds  Hematology   Anesthesia Other Findings   Reproductive/Obstetrics                             Anesthesia Physical Anesthesia Plan  ASA: III  Anesthesia Plan: General   Post-op Pain Management:    Induction: Intravenous  PONV Risk Score and Plan: 3 and Ondansetron and Treatment may vary due to age or medical condition  Airway Management Planned: LMA  Additional Equipment:   Intra-op Plan:   Post-operative Plan: Extubation in OR  Informed Consent: I have reviewed the patients History and Physical, chart, labs and discussed the procedure including the risks, benefits and alternatives for the proposed anesthesia with the patient or authorized representative who has indicated his/her understanding and acceptance.       Plan Discussed with: CRNA and Surgeon  Anesthesia Plan Comments:         Anesthesia Quick Evaluation

## 2020-10-11 NOTE — Progress Notes (Signed)
Physical Therapy Treatment Patient Details Name: [name] MRN: [id number] DOB: Jan 04, 1961 Today's Date: 10/11/2020    History of Present Illness This 59 y.o. female with PMH significant for ESRD on HD, T2DM, HLD, CAD with stents, chronic diastolic CHF, A. 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  Patient was brought to the ED on 12/18 from dialysis center for evaluation of decreased responsiveness.  Pt admitted with acute encephalopathy, afib with RVR, and elevated troponin/NSTEMI with cardio consulted.    PT Comments    Pt tolerates treatment well, ambulating for limited community distances with use of a RW. Pt demonstrates slowed processing, reports she is unable to utilize signs in hallway to direct her back to room, eventually finds rooms by looking in each room as passing. Difficult to assess baseline cognition as no family present during session. Pt will benefit from continued acute PT POC to improve DME management as the pt has difficulty maintaining the RW close to her BOS. PT recommends HHPT and 24/7 supervision of family at the time of discharge.  Follow Up Recommendations  Supervision/Assistance - 24 hour;Home health PT     Equipment Recommendations  None recommended by PT    Recommendations for Other Services       Precautions / Restrictions Precautions Precautions: Fall Restrictions Weight Bearing Restrictions: No    Mobility  Bed Mobility Overal bed mobility: Needs Assistance Bed Mobility: Supine to Sit;Sit to Supine     Supine to sit: Supervision Sit to supine: Supervision      Transfers Overall transfer level: Needs assistance Equipment used: Rolling walker (2 wheeled) Transfers: Sit to/from Stand Sit to Stand: Supervision            Ambulation/Gait Ambulation/Gait assistance: Min guard Gait Distance (Feet): 250 Feet Assistive device: Rolling walker (2  wheeled) Gait Pattern/deviations: Step-through pattern;Trunk flexed Gait velocity: decreased Gait velocity interpretation: <1.8 ft/sec, indicate of risk for recurrent falls General Gait Details: pt with slowed step-through gait, anterior trunk flexion. PT providing frequent verbal cues to maintain RW closer to BOS. Path finding task however pt with short term memory deficits and is unable to remember room number. Pt reports she is unable to read signage to direct her toward room. Pt does eventually find room based on looking into each room when walking by to see which one is hers   Marine scientist [place] (Stroke Patients Only)       Balance Overall balance assessment: Needs assistance Sitting-balance support: Feet supported;No upper extremity supported Sitting balance-Leahy Scale: Good     Standing balance support: No upper extremity supported;During functional activity Standing balance-Leahy Scale: Good Standing balance comment: close supervision to perform hygiene tasks in standing                            Cognition Arousal/Alertness: Awake/alert Behavior During Therapy: Flat affect Overall Cognitive Status: No family/caregiver present to determine baseline cognitive functioning Area of Impairment: Orientation;Attention;Memory;Following commands;Safety/judgement;Awareness;Problem solving                 Orientation Level: Disoriented to;Place Current Attention Level: Sustained Memory: Decreased short-term memory Following Commands: Follows one step commands consistently Safety/Judgement: Decreased awareness of safety;Decreased awareness of deficits Awareness: Intellectual Problem Solving: Slow processing        Exercises  General Comments General comments (skin integrity, edema, etc.): pt often asking for PT to do tasks by pointing at objects rather than vocalizing. Many of these tasks are tasks that the  pt is capable of performing, for example putting down the toilet seat and straightening bed pads      Pertinent Vitals/Pain Pain Assessment: Faces Faces Pain Scale: No hurt    Home Living                      Prior Function            PT Goals (current goals can now be found in the care plan section) Acute Rehab PT Goals Patient Stated Goal: to get up Progress towards PT goals: Progressing toward goals    Frequency    Min 3X/week      PT Plan Current plan remains appropriate    Co-evaluation              AM-PAC PT "6 Clicks" Mobility   Outcome Measure  Help needed turning from your back to your side while in a flat bed without using bedrails?: A Little Help needed moving from lying on your back to sitting on the side of a flat bed without using bedrails?: A Little Help needed moving to and from a bed to a chair (including a wheelchair)?: A Little Help needed standing up from a chair using your arms (e.g., wheelchair or bedside chair)?: A Little Help needed to walk in hospital room?: A Little Help needed climbing 3-5 steps with a railing? : A Little 6 Click Score: 18    End of Session   Activity Tolerance: Patient tolerated treatment well Patient left: in bed;with call bell/phone within reach;with bed alarm set Nurse Communication: Mobility status PT Visit Diagnosis: Unsteadiness on feet (R26.81)     Time: 1610-9604 PT Time Calculation (min) (ACUTE ONLY): 23 min  Charges:  $Gait Training: 8-22 mins $Therapeutic Activity: 8-22 mins                     Zenaida Niece, PT, DPT Acute Rehabilitation Pager: [phone]    Zenaida Niece 10/11/2020, 3:54 PM

## 2020-10-11 NOTE — Progress Notes (Signed)
Unable to reach family members of patient to obtain telephone consent. Report given back to floor nurse.

## 2020-10-11 NOTE — Progress Notes (Signed)
Initial Nutrition Assessment  DOCUMENTATION CODES:   Underweight  INTERVENTION:  Consider initiation of phosphorus binder if levels remain elevated   Renavit daily  Nepro Shake po BID, each supplement provides 425 kcal and 19 grams protein  68ml Prosource Plus po BID, each supplement provides 100 kcals and 15 grams of protein   NUTRITION DIAGNOSIS:   Increased nutrient needs related to chronic illness (ESRD on HD) as evidenced by estimated needs.    GOAL:   Patient will meet greater than or equal to 90% of their needs    MONITOR:   PO intake,Supplement acceptance,Labs,Weight trends,I & O's  REASON FOR ASSESSMENT:   Consult Assessment of nutrition requirement/status  ASSESSMENT:   Pt admitted from HD with AMS and found to have an elevated troponin felt to be possibly due to NSTEMI. PMH includes ESRD on HD, DM, CHF, CAD s/p stent, CVA, Afib, and SAH from ruptured aneurysm with secondary seizure disorder.  AMS thought to be 2/2 fluid shifts and electrolytes during HD; CVA ruled out. Per MD, pt is a poor candidate for cath given history of ICH, risk of DAPT, and poor compliance.  Vascular Surgery was consulted to evaluate pt's R UE AV fistula for intervention and prevention of fistula rupture. Pt noted to be completely confused per surgeon and unable to give consent for surgery; unable to get in contact with next of kin for consent. Surgeon noted that pt does have some hypopigmentation over mega fistula that will need revision but is not urgent at this time. Surgery rescheduled for next week.   Pt with good intake since admission. Meal completions documented as 40-100% x 6 recorded meals (65% average meal intake).   Reviewed weight history. No significant weight changes noted. Pt noted to be underweight -- BMI 17.91. Suspect pt is malnourished given low BMI and PMH of ESRD on HD. Unable to perform nutrition-focused physical exam at this time to diagnose malnutrition. Will  attempt at follow-up.  EDW 37 kg Current wt 43 kg Last HD 12/21, net UF 1575ml  No UOP documented.   Labs: Na 132 (L), PO4 7.8 (H), CBGs 88-107 Medications reviewed.    Diet Order:   Diet Order            Diet regular Room service appropriate? Yes; Fluid consistency: Thin; Fluid restriction: 1200 mL Fluid  Diet effective now                 EDUCATION NEEDS:   No education needs have been identified at this time  Skin:  Skin Assessment: Skin Integrity Issues: Skin Integrity Issues:: Incisions Incisions: R arm  Last BM:  12/22  Height:   Ht Readings from Last 1 Encounters:  12/06/19 5\' 1"  (1.549 m)    Weight:   Wt Readings from Last 1 Encounters:  10/11/20 43 kg   BMI:  Body mass index is 17.91 kg/m.  Estimated Nutritional Needs:   Kcal:  1350-1550  Protein:  65-75 grams  Fluid:  1L + UOP    Larkin Ina, MS, RD, LDN RD pager number and weekend/on-call pager number located in [place].

## 2020-10-11 NOTE — Progress Notes (Addendum)
PROGRESS NOTE  Ebonie Westerlund SWH:[id number] DOB: 13-May-1961 DOA: 10/06/2020 PCP: Garwin Brothers, MD  HPI/Recap of past 42 hours: 59 year old female past medical history significant for end-stage renal disease on hemodialysis, diabetes mellitus, chronic diastolic heart failure, CAD status post stent, CVA, A. 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  Code stroke activated but by time patient was seen by neurology, symptoms have resolved.  CT negative for acute findings.  No evidence of occlusion on CTA.  She was admitted at that time for altered mental status which is since resolved.  She was found to have an elevated troponin felt to possibly be from a non-STEMI and cardiology recommended heparin for 48 hours followed by medical management only.  Given her intracranial hemorrhage and poor compliance issues, patient felt not to be a good candidate for a left heart catheterization.  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  Patient was scheduled for surgical correction on 12/23, however she is confused and cannot give consent.  Attempts to contact family members were unsuccessful and surgery has been postponed.  Not felt to be urgent at this time.  Assessment/Plan: Principal Problem:   AMS (altered mental status)/acute encephalopathy: Possibly related to fluid shifts and electrolytes during dialysis.  Seen by neurology.  CVA ruled out.  No evidence of occlusion.  She remains confused. Active Problems:   Mixed hyperlipidemia: Continue Crestor.    ESRD (end stage renal disease) ([place]): Did not complete dialysis on 12/18 because of her symptoms.  Seen by nephrology.  Next hemodialysis planned for 12/23.  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  Vascular  surgery unable to do surgical correction due to consent.  We will attempt to contact family to obtain this for surgery at a later time.    HTN (hypertension): Blood pressure elevated and continued on home meds.  Managing some of this with filtration.  Improved today.    Atrial fibrillation with rapid ventricular response ([place]): Converted to sinus rhythm with Cardizem and amiodarone.  Now on metoprolol.  Heart rate better controlled.  Appreciate cardiology consult.    Elevated troponin: Possibly from non-STEMI.  History of CAD with stents.  Started on heparin for 48 hours.  Stopped after 48 hours not a good candidate for long-term anticoagulation given intracranial hemorrhage.  Active candidate for cardiac cath given her intracranial hemorrhage as well as with poor compliance issues.   Code Status: Full code  Family Communication: Was able to talk to patient's daughter, Wilford Sports.  She said she worked last night and was asleep and missed the surgeon's call.  She supports her mom having surgery and said that she can be reached at her listed cell phone: [phone] or her other phone, [phone].  Disposition Plan: Next week following surgical correction of her fistula  Consultants:  Vascular surgery  Nephrology  Cardiology  Neurology  Procedures:  Hemodialysis  Echocardiogram done 4/19: No evidence of valvular dysfunction.  Diastolic function indeterminate.  Preserved ejection fraction  Antimicrobials:  None  DVT prophylaxis: Was on heparin, now SCDs   Objective: Vitals:   10/11/20 0328 10/11/20 1239  BP: 133/75 (!) 150/54  Pulse: 71 69  Resp: 16 17  Temp: 98 F (36.7 C) 98.4 F (36.9 C)  SpO2: 99% 100%  Intake/Output Summary (Last 24 hours) at 10/11/2020 1405 Last data filed at 10/10/2020 1700 Gross per 24 hour  Intake 360 ml  Output --  Net 360 ml   Filed Weights   10/09/20 0740 10/09/20 1120 10/11/20 0328  Weight: 43.5 kg 41.8 kg 43 kg   Body  mass index is 17.91 kg/m.  Exam:   General: Currently resting comfortably, reported confusion earlier  HEENT: Cephalic atraumatic, mucous membranes are slightly dry  Cardiovascular: Regular rate and rhythm, S1-S2  Respiratory: Clear to auscultation bilaterally  Abdomen: Soft, nontender, nondistended, positive bowel sounds  Musculoskeletal: No clubbing or cyanosis, trace pitting edema  Skin: Fistula noted with ulceration  Psychiatry: Somnolent.  Reported confusion earlier   Data Reviewed: CBC: Recent Labs  Lab 10/06/20 1726 10/06/20 1728 10/09/20 0205 10/10/20 0301  WBC  --  8.2 4.5 4.8  NEUTROABS  --  6.1  --   --   HGB 16.3* 14.7 12.4 12.2  HCT 48.0* 46.8* 35.8* 37.0  MCV  --  92.9 86.7 87.7  PLT  --  170 195 742   Basic Metabolic Panel: Recent Labs  Lab 10/06/20 1726 10/06/20 1728 10/09/20 0205 10/10/20 0301  NA 137 141 127* 132*  K 4.5 4.0 4.4 4.5  CL 95* 90* 83* 91*  CO2  --  25 24 24   GLUCOSE 176* 186* 85 107*  BUN 29* 20 62* 37*  CREATININE 4.60* 4.75* 9.86* 6.37*  CALCIUM  --  9.1 9.6 9.3  MG  --   --  2.4 2.2  PHOS  --   --  10.1* 7.8*   GFR: CrCl cannot be calculated (Unknown ideal weight.). Liver Function Tests: Recent Labs  Lab 10/06/20 1728 10/10/20 0301  AST 18 21  ALT 12 13  ALKPHOS 61 55  BILITOT 0.3 0.6  PROT 8.3* 6.4*  ALBUMIN 4.0 3.3*   No results for input(s): LIPASE, AMYLASE in the last 168 hours. Recent Labs  Lab 10/07/20 0205  AMMONIA 18   Coagulation Profile: Recent Labs  Lab 10/06/20 1728  INR 1.0   Cardiac Enzymes: No results for input(s): CKTOTAL, CKMB, CKMBINDEX, TROPONINI in the last 168 hours. BNP (last 3 results) No results for input(s): PROBNP in the last 8760 hours. HbA1C: No results for input(s): HGBA1C in the last 72 hours. CBG: Recent Labs  Lab 10/10/20 1947 10/10/20 2325 10/11/20 0328 10/11/20 0743 10/11/20 1238  GLUCAP 117* 143* 107* 101* 88   Lipid Profile: No results for input(s):  CHOL, HDL, LDLCALC, TRIG, CHOLHDL, LDLDIRECT in the last 72 hours. Thyroid Function Tests: No results for input(s): TSH, T4TOTAL, FREET4, T3FREE, THYROIDAB in the last 72 hours. Anemia Panel: No results for input(s): VITAMINB12, FOLATE, FERRITIN, TIBC, IRON, RETICCTPCT in the last 72 hours. Urine analysis:    Component Value Date/Time   COLORURINE YELLOW 11/08/2013 [place] 11/08/2013 1304   LABSPEC 1.009 11/08/2013 1304   PHURINE 7.5 11/08/2013 1304   GLUCOSEU NEGATIVE 11/08/2013 1304   HGBUR NEGATIVE 11/08/2013 1304   BILIRUBINUR NEGATIVE 11/08/2013 1304   KETONESUR NEGATIVE 11/08/2013 1304   PROTEINUR NEGATIVE 11/08/2013 1304   UROBILINOGEN 0.2 11/08/2013 1304   NITRITE NEGATIVE 11/08/2013 1304   LEUKOCYTESUR NEGATIVE 11/08/2013 1304   Sepsis Labs: @LABRCNTIP (procalcitonin:4,lacticidven:4)  ) Recent Results (from the past 240 hour(s))  Resp Panel by RT-PCR (Flu A&B, Covid) Nasopharyngeal Swab     Status: None   Collection Time: 10/06/20  5:25 PM   Specimen: Nasopharyngeal Swab; Nasopharyngeal(NP) swabs in vial  transport medium  Result Value Ref Range Status   SARS Coronavirus 2 by RT PCR NEGATIVE NEGATIVE Final    Comment: (NOTE) SARS-CoV-2 target nucleic acids are NOT DETECTED.  The SARS-CoV-2 RNA is generally detectable in upper respiratory specimens during the acute phase of infection. The lowest concentration of SARS-CoV-2 viral copies this assay can detect is 138 copies/mL. A negative result does not preclude SARS-Cov-2 infection and should not be used as the sole basis for treatment or other patient management decisions. A negative result may occur with  improper specimen collection/handling, submission of specimen other than nasopharyngeal swab, presence of viral mutation(s) within the areas targeted by this assay, and inadequate number of viral copies(<138 copies/mL). A negative result must be combined with clinical observations, patient  history, and epidemiological information. The expected result is Negative.  Fact Sheet for Patients:  EntrepreneurPulse.com.au  Fact Sheet for Healthcare Providers:  IncredibleEmployment.be  This test is no t yet approved or cleared by the [place] FDA and  has been authorized for detection and/or diagnosis of SARS-CoV-2 by FDA under an Emergency Use Authorization (EUA). This EUA will remain  in effect (meaning this test can be used) for the duration of the COVID-19 declaration under Section 564(b)(1) of the Act, 21 U.S.C.section 360bbb-3(b)(1), unless the authorization is terminated  or revoked sooner.       Influenza A by PCR NEGATIVE NEGATIVE Final   Influenza B by PCR NEGATIVE NEGATIVE Final    Comment: (NOTE) The Xpert Xpress SARS-CoV-2/FLU/RSV plus assay is intended as an aid in the diagnosis of influenza from Nasopharyngeal swab specimens and should not be used as a sole basis for treatment. Nasal washings and aspirates are unacceptable for Xpert Xpress SARS-CoV-2/FLU/RSV testing.  Fact Sheet for Patients: EntrepreneurPulse.com.au  Fact Sheet for Healthcare Providers: IncredibleEmployment.be  This test is not yet approved or cleared by the [place] FDA and has been authorized for detection and/or diagnosis of SARS-CoV-2 by FDA under an Emergency Use Authorization (EUA). This EUA will remain in effect (meaning this test can be used) for the duration of the COVID-19 declaration under Section 564(b)(1) of the Act, 21 U.S.C. section 360bbb-3(b)(1), unless the authorization is terminated or revoked.  Performed at [facility], [place] [address], [place], [place] [address]   MRSA PCR Screening     Status: None   Collection Time: 10/10/20  8:35 PM   Specimen: Nasal Mucosa; Nasopharyngeal  Result Value Ref Range Status   MRSA by PCR NEGATIVE NEGATIVE Final    Comment:        The  GeneXpert MRSA Assay (FDA approved for NASAL specimens only), is one component of a comprehensive MRSA colonization surveillance program. It is not intended to diagnose MRSA infection nor to guide or monitor treatment for MRSA infections. Performed at [facility], [place] [address] [place], [place] [address]       Studies: No results found.  Scheduled Meds: .  stroke: mapping our early stages of recovery book   Does not apply Once  . amiodarone  200 mg Oral Daily  . aspirin EC  81 mg Oral Daily  . Chlorhexidine Gluconate Cloth  6 each Topical Q0600  . metoprolol tartrate  25 mg Oral BID  . rosuvastatin  10 mg Oral Daily    Continuous Infusions: . sodium chloride    . sodium chloride    . sodium chloride Stopped (10/11/20 1247)     LOS: 4 days  Annita Brod, MD [facility]   10/11/2020, 2:05 PM

## 2020-10-11 NOTE — Progress Notes (Signed)
Patient ID: [name], female   DOB: 1961-05-14, 59 y.o.   MRN: [id number] I am meeting patient for the first time this morning.  She is completely confused.  Cannot give consent for surgery.  Reports that she lives with her daughter but cannot give her daughter's name.  And I asked her what were planning on doing today she says she does not know.  She does have some hypopigmentation over her Mega fistula.  Will need revision but this is not urgent.  No episodes of bleeding.  We have attempted multiple times to contact her next of kin for consent and have been unsuccessful.  She will be canceled today.  Will return to floor and will reschedule for next week

## 2020-10-12 DIAGNOSIS — G9341 Metabolic encephalopathy: Secondary | ICD-10-CM

## 2020-10-12 LAB — GLUCOSE, CAPILLARY
Glucose-Capillary: 79 mg/dL (ref 70–99)
Glucose-Capillary: 94 mg/dL (ref 70–99)
Glucose-Capillary: 102 mg/dL — ABNORMAL HIGH (ref 70–99)
Glucose-Capillary: 76 mg/dL (ref 70–99)
Glucose-Capillary: 77 mg/dL (ref 70–99)

## 2020-10-12 LAB — RENAL FUNCTION PANEL
Albumin: 2.9 g/dL — ABNORMAL LOW (ref 3.5–5.0)
Anion gap: 19 — ABNORMAL HIGH (ref 5–15)
BUN: 100 mg/dL — ABNORMAL HIGH (ref 6–20)
CO2: 22 mmol/L (ref 22–32)
Calcium: 9.2 mg/dL (ref 8.9–10.3)
Chloride: 92 mmol/L — ABNORMAL LOW (ref 98–111)
Creatinine, Ser: 12.32 mg/dL — ABNORMAL HIGH (ref 0.44–1.00)
GFR, Estimated: 3 mL/min — ABNORMAL LOW (ref >60)
Glucose, Bld: 129 mg/dL — ABNORMAL HIGH (ref 70–99)
Phosphorus: 9 mg/dL — ABNORMAL HIGH (ref 2.5–4.6)
Potassium: 5.2 mmol/L — ABNORMAL HIGH (ref 3.5–5.1)
Sodium: 133 mmol/L — ABNORMAL LOW (ref 135–145)

## 2020-10-12 LAB — CBC
HCT: 32.8 % — ABNORMAL LOW (ref 36.0–46.0)
Hemoglobin: 11.3 g/dL — ABNORMAL LOW (ref 12.0–15.0)
MCH: 30.5 pg (ref 26.0–34.0)
MCHC: 34.5 g/dL (ref 30.0–36.0)
MCV: 88.4 fL (ref 80.0–100.0)
Platelets: 187 10*3/uL (ref 150–400)
RBC: 3.71 MIL/uL — ABNORMAL LOW (ref 3.87–5.11)
RDW: 14.7 % (ref 11.5–15.5)
WBC: 4.3 10*3/uL (ref 4.0–10.5)
nRBC: 0 % (ref 0.0–0.2)

## 2020-10-12 NOTE — Procedures (Signed)
Patient seen on Hemodialysis. BP 133/63   Pulse 64   Temp 98.4 F (36.9 C) (Oral)   Resp 20   Wt 43.7 kg   SpO2 100%   BMI 18.20 kg/m   QB 400, UF goal 2L Tolerating treatment without complaints at this time.   Elmarie Shiley MD [facility]. Office # [phone] Pager # [phone] 12:34 PM

## 2020-10-12 NOTE — Progress Notes (Addendum)
Patient ID: [name], female   DOB: 10-Nov-1960, 59 y.o.   MRN: [id number]  [facility] Progress Note   Assessment/ Plan:   1.  Altered mental status: Unclear etiology with mental status gradually improving and appears to be now towards her baseline.  Work-up negative to date.  She has underlying dementia. 2. ESRD: She is usually on a TTS dialysis schedule and will undergo hemodialysis today off schedule after dialysis postponed from yesterday anticipating surgery.  Unfortunately unable to repair right brachiocephalic fistula yesterday as she was unable to consent due to altered mentation-vascular surgery notes reviewed. 3. Anemia: Without overt blood loss and hemoglobin and hematocrit currently at goal off of ESA. 4. CKD-MBD: Significant hyperphosphatemia-continue renal diet with restart of Velphoro today for phosphorus binding.  Resume cinacalcet for PTH control. 5. Nutrition: Mental status improving and should help facilitate improving oral intake. 6. Hypertension: Blood pressure marginally elevated, continue to monitor on current antihypertensive therapy and HD.  Subjective:   Without any obvious complaints-right upper arm brachiocephalic fistula tender to palpation.   Objective:   BP (!) 149/58 (BP Location: Left Arm)   Pulse 72   Temp 98.4 F (36.9 C) (Oral)   Resp 18   Wt 45.9 kg   SpO2 100%   BMI 19.12 kg/m   Physical Exam: Gen: Appears uncomfortable resting in bed, minimal verbal responses CVS: Pulse regular rhythm, normal rate, S1 and S2 normal without murmur/gallop Resp: Poor inspiratory effort with decreased breath sounds over bases, no distinct rales or rhonchi Abd: Soft, flat, mildly tender over lower quadrants, bowel sounds normal Ext: No lower extremity edema.  Right upper arm brachiocephalic fistula with 2 large pulsatile aneurysms (tender) with thin skin.  Labs: BMET Recent Labs  Lab 10/06/20 1726 10/06/20 1728 10/09/20 0205 10/10/20 0301   NA 137 141 127* 132*  K 4.5 4.0 4.4 4.5  CL 95* 90* 83* 91*  CO2  --  25 24 24   GLUCOSE 176* 186* 85 107*  BUN 29* 20 62* 37*  CREATININE 4.60* 4.75* 9.86* 6.37*  CALCIUM  --  9.1 9.6 9.3  PHOS  --   --  10.1* 7.8*   CBC Recent Labs  Lab 10/06/20 1726 10/06/20 1728 10/09/20 0205 10/10/20 0301  WBC  --  8.2 4.5 4.8  NEUTROABS  --  6.1  --   --   HGB 16.3* 14.7 12.4 12.2  HCT 48.0* 46.8* 35.8* 37.0  MCV  --  92.9 86.7 87.7  PLT  --  170 195 185     Medications:    .  stroke: mapping our early stages of recovery book   Does not apply Once  . (feeding supplement) PROSource Plus  30 mL Oral BID BM  . amiodarone  200 mg Oral Daily  . aspirin EC  81 mg Oral Daily  . Chlorhexidine Gluconate Cloth  6 each Topical Q0600  . feeding supplement (NEPRO CARB STEADY)  237 mL Oral BID BM  . metoprolol tartrate  25 mg Oral BID  . multivitamin  1 tablet Oral QHS  . rosuvastatin  10 mg Oral Daily   Elmarie Shiley, MD 10/12/2020, 10:25 AM

## 2020-10-12 NOTE — Progress Notes (Signed)
PROGRESS NOTE  Demetress Tift ZOX:[id number] DOB: 15-Apr-1961 DOA: 10/06/2020 PCP: Garwin Brothers, MD  HPI/Recap of past 14 hours: 59 year old female past medical history significant for end-stage renal disease on hemodialysis, diabetes mellitus, chronic diastolic heart failure, CAD status post stent, CVA, A. 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  Code stroke activated but by time patient was seen by neurology, symptoms have resolved.  CT negative for acute findings.  No evidence of occlusion on CTA.  She was admitted at that time for altered mental status which is since resolved.  She was found to have an elevated troponin felt to possibly be from a non-STEMI and cardiology recommended heparin for 48 hours followed by medical management only.  Given her intracranial hemorrhage and poor compliance issues, patient felt not to be a good candidate for a left heart catheterization.  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  Patient was scheduled for surgical correction on 12/23, however she is confused and cannot give consent and surgery was unable to contact family, so this was postponed. (I have since spoken to the patient's daughter who wants to proceed with surgery, hopefully can be rescheduled for next week.)  Today, patient fatigued, getting dialysis, no complaints.  Assessment/Plan: Principal Problem:   AMS (altered mental status)/acute encephalopathy: Possibly related to fluid shifts and electrolytes during dialysis.  Seen by neurology.  CVA ruled out.  No evidence of occlusion.  Appears to be a bit more alert today. Active Problems:   Mixed hyperlipidemia: Continue Crestor.    ESRD (end stage renal disease) ([place]): Did not complete dialysis on 12/18 because of her symptoms.  Seen by  nephrology.  Next hemodialysis planned for 12/23.  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  Vascular surgery unable to do surgical correction due to consent.  Spoke with patient's daughter who would like to proceed, hopefully surgery can be scheduled next week    HTN (hypertension): Blood pressure elevated and continued on home meds.  Managing some of this with filtration.  Better in the last few days    Atrial fibrillation with rapid ventricular response ([place]): Converted to sinus rhythm with Cardizem and amiodarone.  Now on metoprolol.  Heart rate better controlled.  Appreciate cardiology consult.    Elevated troponin: Possibly from non-STEMI.  History of CAD with stents.  Started on heparin for 48 hours.  Stopped after 48 hours not a good candidate for long-term anticoagulation given intracranial hemorrhage.  Active candidate for cardiac cath given her intracranial hemorrhage as well as with poor compliance issues.   Code Status: Full code  Family Communication: Was able to talk to patient's daughter, Wilford Sports on 4/23.  She said she worked last night and was asleep and missed the surgeon's call.  She supports her mom having surgery and said that she can be reached at her listed cell phone: [phone] or her other phone, [phone].  Disposition Plan: Next week following surgical correction of her fistula  Consultants:  Vascular surgery  Nephrology  Cardiology  Neurology  Procedures:  Hemodialysis  Echocardiogram done 4/19: No evidence of valvular dysfunction.  Diastolic function indeterminate.  Preserved ejection fraction  Antimicrobials:  None  DVT prophylaxis: Was on heparin, now SCDs   Objective: Vitals:   10/12/20 1300 10/12/20 1330  BP: (!) 118/58 118/65  Pulse:    Resp: (!) 24 (!) 24  Temp:    SpO2:      Intake/Output Summary (Last 24 hours) at 10/12/2020 1404 Last data filed at 10/12/2020 0800 Gross  per 24 hour  Intake 844.5 ml  Output --  Net 844.5 ml   Filed Weights   10/11/20 0328 10/12/20 0456 10/12/20 1100  Weight: 43 kg 45.9 kg 43.7 kg   Body mass index is 18.2 kg/m.  Exam:   General: Alert and oriented x2, no acute distress  HEENT: Normocephalic, atraumatic, mucous membranes are slightly dry  Cardiovascular: Regular rate and rhythm, S1-S2  Respiratory: Clear to auscultation bilaterally  Abdomen: Soft, nontender, nondistended, positive bowel sounds  Musculoskeletal: No clubbing or cyanosis, trace pitting edema  Skin: Fistula noted with ulceration  Psychiatry: Appropriate, currently not confused.  No evidence of psychosis   Data Reviewed: CBC: Recent Labs  Lab 10/06/20 1726 10/06/20 1728 10/09/20 0205 10/10/20 0301 10/12/20 1127  WBC  --  8.2 4.5 4.8 4.3  NEUTROABS  --  6.1  --   --   --   HGB 16.3* 14.7 12.4 12.2 11.3*  HCT 48.0* 46.8* 35.8* 37.0 32.8*  MCV  --  92.9 86.7 87.7 88.4  PLT  --  170 195 185 725   Basic Metabolic Panel: Recent Labs  Lab 10/06/20 1726 10/06/20 1728 10/09/20 0205 10/10/20 0301 10/12/20 1127  NA 137 141 127* 132* 133*  K 4.5 4.0 4.4 4.5 5.2*  CL 95* 90* 83* 91* 92*  CO2  --  25 24 24 22   GLUCOSE 176* 186* 85 107* 129*  BUN 29* 20 62* 37* 100*  CREATININE 4.60* 4.75* 9.86* 6.37* 12.32*  CALCIUM  --  9.1 9.6 9.3 9.2  MG  --   --  2.4 2.2  --   PHOS  --   --  10.1* 7.8* 9.0*   GFR: CrCl cannot be calculated (Unknown ideal weight.). Liver Function Tests: Recent Labs  Lab 10/06/20 1728 10/10/20 0301 10/12/20 1127  AST 18 21  --   ALT 12 13  --   ALKPHOS 61 55  --   BILITOT 0.3 0.6  --   PROT 8.3* 6.4*  --   ALBUMIN 4.0 3.3* 2.9*   No results for input(s): LIPASE, AMYLASE in the last 168 hours. Recent Labs  Lab 10/07/20 0205  AMMONIA 18   Coagulation Profile: Recent Labs  Lab 10/06/20 1728  INR 1.0   Cardiac Enzymes: No results for input(s): CKTOTAL, CKMB, CKMBINDEX, TROPONINI in the last 168  hours. BNP (last 3 results) No results for input(s): PROBNP in the last 8760 hours. HbA1C: No results for input(s): HGBA1C in the last 72 hours. CBG: Recent Labs  Lab 10/11/20 2016 10/11/20 2312 10/12/20 0454 10/12/20 0748 10/12/20 1046  GLUCAP 98 95 79 94 102*   Lipid Profile: No results for input(s): CHOL, HDL, LDLCALC, TRIG, CHOLHDL, LDLDIRECT in the last 72 hours. Thyroid Function Tests: No results for input(s): TSH, T4TOTAL, FREET4, T3FREE, THYROIDAB in the last 72 hours. Anemia Panel: No results for input(s): VITAMINB12, FOLATE, FERRITIN, TIBC, IRON, RETICCTPCT in the last 72 hours. Urine analysis:    Component Value Date/Time   COLORURINE YELLOW 11/08/2013 [place] 11/08/2013 1304   LABSPEC 1.009 11/08/2013 1304   PHURINE 7.5 11/08/2013 1304   GLUCOSEU NEGATIVE 11/08/2013 1304   HGBUR NEGATIVE 11/08/2013 1304   BILIRUBINUR NEGATIVE 11/08/2013 1304   KETONESUR NEGATIVE 11/08/2013  [place] 11/08/2013 1304   UROBILINOGEN 0.2 11/08/2013 1304   NITRITE NEGATIVE 11/08/2013 1304   LEUKOCYTESUR NEGATIVE 11/08/2013 1304   Sepsis Labs: @LABRCNTIP (procalcitonin:4,lacticidven:4)  ) Recent Results (from the past 240 hour(s))  Resp Panel by RT-PCR (Flu A&B, Covid) Nasopharyngeal Swab     Status: None   Collection Time: 10/06/20  5:25 PM   Specimen: Nasopharyngeal Swab; Nasopharyngeal(NP) swabs in vial transport medium  Result Value Ref Range Status   SARS Coronavirus 2 by RT PCR NEGATIVE NEGATIVE Final    Comment: (NOTE) SARS-CoV-2 target nucleic acids are NOT DETECTED.  The SARS-CoV-2 RNA is generally detectable in upper respiratory specimens during the acute phase of infection. The lowest concentration of SARS-CoV-2 viral copies this assay can detect is 138 copies/mL. A negative result does not preclude SARS-Cov-2 infection and should not be used as the sole basis for treatment or other patient management decisions. A negative result  may occur with  improper specimen collection/handling, submission of specimen other than nasopharyngeal swab, presence of viral mutation(s) within the areas targeted by this assay, and inadequate number of viral copies(<138 copies/mL). A negative result must be combined with clinical observations, patient history, and epidemiological information. The expected result is Negative.  Fact Sheet for Patients:  EntrepreneurPulse.com.au  Fact Sheet for Healthcare Providers:  IncredibleEmployment.be  This test is no t yet approved or cleared by the [place] FDA and  has been authorized for detection and/or diagnosis of SARS-CoV-2 by FDA under an Emergency Use Authorization (EUA). This EUA will remain  in effect (meaning this test can be used) for the duration of the COVID-19 declaration under Section 564(b)(1) of the Act, 21 U.S.C.section 360bbb-3(b)(1), unless the authorization is terminated  or revoked sooner.       Influenza A by PCR NEGATIVE NEGATIVE Final   Influenza B by PCR NEGATIVE NEGATIVE Final    Comment: (NOTE) The Xpert Xpress SARS-CoV-2/FLU/RSV plus assay is intended as an aid in the diagnosis of influenza from Nasopharyngeal swab specimens and should not be used as a sole basis for treatment. Nasal washings and aspirates are unacceptable for Xpert Xpress SARS-CoV-2/FLU/RSV testing.  Fact Sheet for Patients: EntrepreneurPulse.com.au  Fact Sheet for Healthcare Providers: IncredibleEmployment.be  This test is not yet approved or cleared by the [place] FDA and has been authorized for detection and/or diagnosis of SARS-CoV-2 by FDA under an Emergency Use Authorization (EUA). This EUA will remain in effect (meaning this test can be used) for the duration of the COVID-19 declaration under Section 564(b)(1) of the Act, 21 U.S.C. section 360bbb-3(b)(1), unless the authorization is terminated  or revoked.  Performed at [facility], [place] [address], [place], [place] [address]   MRSA PCR Screening     Status: None   Collection Time: 10/10/20  8:35 PM   Specimen: Nasal Mucosa; Nasopharyngeal  Result Value Ref Range Status   MRSA by PCR NEGATIVE NEGATIVE Final    Comment:        The GeneXpert MRSA Assay (FDA approved for NASAL specimens only), is one component of a comprehensive MRSA colonization surveillance program. It is not intended to diagnose MRSA infection nor to guide or monitor treatment for MRSA infections. Performed at [facility], [place] [address], [place], [place] [address]       Studies: No results found.  Scheduled Meds: .  stroke: mapping our early stages of recovery book   Does not apply Once  . (feeding supplement) PROSource Plus  30 mL Oral BID BM  . amiodarone  200 mg Oral Daily  . aspirin EC  81 mg Oral Daily  . Chlorhexidine Gluconate Cloth  6 each Topical Q0600  . feeding supplement (NEPRO CARB STEADY)  237 mL Oral BID BM  . metoprolol tartrate  25 mg Oral BID  . multivitamin  1 tablet Oral QHS  . rosuvastatin  10 mg Oral Daily    Continuous Infusions: . sodium chloride    . sodium chloride    . sodium chloride Stopped (10/11/20 1247)     LOS: 5 days     Annita Brod, MD [facility]   10/12/2020, 2:04 PM

## 2020-10-12 NOTE — Progress Notes (Signed)
°  Progress Note    10/12/2020 10:12 AM 1 Day Post-Op  Subjective: She does not have any complaints today  Vitals:   10/12/20 0750 10/12/20 0919  BP: (!) 149/58   Pulse: 70 72  Resp: 18   Temp: 98.4 F (36.9 C)   SpO2: 100%     Physical Exam: Awake alert and oriented Right arm AV fistula with 2 areas of pseudoaneurysmal degeneration and thin skin, no evidence of bleeding  CBC    Component Value Date/Time   WBC 4.8 10/10/2020 0301   RBC 4.22 10/10/2020 0301   HGB 12.2 10/10/2020 0301   HCT 37.0 10/10/2020 0301   PLT 185 10/10/2020 0301   MCV 87.7 10/10/2020 0301   MCH 28.9 10/10/2020 0301   MCHC 33.0 10/10/2020 0301   RDW 14.7 10/10/2020 0301   LYMPHSABS 1.6 10/06/2020 1728   MONOABS 0.3 10/06/2020 1728   EOSABS 0.0 10/06/2020 1728   BASOSABS 0.0 10/06/2020 1728    BMET    Component Value Date/Time   NA 132 (L) 10/10/2020 0301   K 4.5 10/10/2020 0301   CL 91 (L) 10/10/2020 0301   CO2 24 10/10/2020 0301   GLUCOSE 107 (H) 10/10/2020 0301   BUN 37 (H) 10/10/2020 0301   CREATININE 6.37 (H) 10/10/2020 0301   CALCIUM 9.3 10/10/2020 0301   GFRNONAA 7 (L) 10/10/2020 0301   GFRAA 5 (L) 02/03/2019 0652    INR    Component Value Date/Time   INR 1.0 10/06/2020 1728     Intake/Output Summary (Last 24 hours) at 10/12/2020 1012 Last data filed at 10/12/2020 0800 Gross per 24 hour  Intake 844.5 ml  Output --  Net 844.5 ml     Assessment/plan:  59 y.o. female has 2 pseudoaneurysmal areas with thin skin that was scheduled for operative revision yesterday but patient was unable to consent.  By my exam she is more awake and alert today.  She has not had any bleeding from the fistula does not require urgent operation.  If we are able to obtain consent this could be done next week prior to discharge or even electively as an outpatient.   Aquila Delaughter C. Donzetta Matters, MD [facility] Office: [phone] Pager: [phone]  10/12/2020 10:12  AM

## 2020-10-13 LAB — GLUCOSE, CAPILLARY
Glucose-Capillary: 119 mg/dL — ABNORMAL HIGH (ref 70–99)
Glucose-Capillary: 135 mg/dL — ABNORMAL HIGH (ref 70–99)
Glucose-Capillary: 143 mg/dL — ABNORMAL HIGH (ref 70–99)
Glucose-Capillary: 144 mg/dL — ABNORMAL HIGH (ref 70–99)
Glucose-Capillary: 92 mg/dL (ref 70–99)

## 2020-10-13 MED ORDER — MELATONIN 5 MG PO TABS
5.0000 mg | ORAL_TABLET | Freq: Every day | ORAL | Status: DC
  Administered 2020-10-13 – 2020-10-16 (×4): 5 mg via ORAL
  Filled 2020-10-13 (×4): qty 1

## 2020-10-13 MED ORDER — CALCITRIOL 0.25 MCG PO CAPS
0.5000 ug | ORAL_CAPSULE | ORAL | Status: DC
  Filled 2020-10-13: qty 2

## 2020-10-13 MED ORDER — CINACALCET HCL 30 MG PO TABS
60.0000 mg | ORAL_TABLET | ORAL | Status: DC
  Filled 2020-10-13: qty 2

## 2020-10-13 NOTE — Plan of Care (Signed)

## 2020-10-13 NOTE — Progress Notes (Signed)
PROGRESS NOTE  [name] MOQ:[id number] DOB: Dec 04, 1960 DOA: 10/06/2020 PCP: Garwin Brothers, MD  HPI/Recap of past 64 hours: 59 year old female past medical history significant for end-stage renal disease on hemodialysis, diabetes mellitus, chronic diastolic heart failure, CAD status post stent, CVA, A. 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  Code stroke activated but by time patient was seen by neurology, symptoms have resolved.  CT negative for acute findings.  No evidence of occlusion on CTA.  She was admitted at that time for altered mental status which is since resolved.  She was found to have an elevated troponin felt to possibly be from a non-STEMI and cardiology recommended heparin for 48 hours followed by medical management only.  Given her intracranial hemorrhage and poor compliance issues, patient felt not to be a good candidate for a left heart catheterization.  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  Patient was scheduled for surgical correction on 12/23, however she is confused and cannot give consent and surgery was unable to contact family, so this was postponed. (I have since spoken to the patient's daughter who wants to proceed with surgery, hopefully can be rescheduled for next week.)  Patient remains alert.  Doing okay.  Feels little tired no significant pain.  Assessment/Plan: Principal Problem:   AMS (altered mental status)/acute encephalopathy: Possibly related to fluid shifts and electrolytes during dialysis.  Seen by neurology.  CVA ruled out.  No evidence of occlusion.  Has been a bit more alert the last 2 days. Active Problems:   Mixed hyperlipidemia: Continue Crestor.    ESRD (end stage renal disease) ([place]): Did not complete dialysis on 12/18, when she first  came in because of her symptoms.  Nephrology following.  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  Vascular surgery unable to do surgical correction due to consent.  Spoke with patient's daughter who would like to proceed, hopefully surgery can be scheduled next week.  [facility] dialysis is planned for Sunday, 12/26 putting her back on her TTS schedule.    HTN (hypertension): Blood pressure elevated and continued on home meds.  Managing some of this with filtration.  Better in the last few days    Atrial fibrillation with rapid ventricular response ([place]): Converted to sinus rhythm with Cardizem and amiodarone.  Now on metoprolol.  Heart rate now controlled.  Appreciate cardiology consult.    Elevated troponin: Possibly from non-STEMI.  History of CAD with stents.  Started on heparin for 48 hours.  Stopped after 48 hours not a good candidate for long-term anticoagulation given intracranial hemorrhage.  Active candidate for cardiac cath given her intracranial hemorrhage as well as with poor compliance issues.   Code Status: Full code  Family Communication: Was able to talk to patient's daughter, Wilford Sports on 12/23.  She said she worked last night and was asleep and missed the surgeon's call.  She supports her mom having surgery and said that she can be reached at her listed cell phone: [phone] or her other phone, [phone].  Disposition Plan: Next week following surgical correction of her fistula  Consultants:  Vascular surgery  Nephrology  Cardiology  Neurology  Procedures:  Hemodialysis  Echocardiogram done 4/19: No evidence of valvular dysfunction.  Diastolic function indeterminate.  Preserved ejection fraction  Antimicrobials:  None  DVT prophylaxis: Was on heparin, now SCDs   Objective: Vitals:   10/13/20 0820 10/13/20 1120  BP: 122/80 139/64  Pulse: 74 65  Resp: 14 16  Temp: 98.2 F (36.8 C) 98.3 F (36.8 C)   SpO2: 100% 100%    Intake/Output Summary (Last 24 hours) at 10/13/2020 1340 Last data filed at 10/13/2020 1308 Gross per 24 hour  Intake 810 ml  Output 2004 ml  Net -1194 ml   Filed Weights   10/12/20 1100 10/12/20 1500 10/13/20 0500  Weight: 43.7 kg 41.7 kg 45.9 kg   Body mass index is 19.12 kg/m.  Exam:   General: Alert and oriented x2, no acute distress  HEENT: Normocephalic, atraumatic, mucous membranes are slightly dry  Cardiovascular: Regular rate and rhythm, S1-S2  Respiratory: Clear to auscultation bilaterally  Abdomen: Soft, nontender, nondistended, positive bowel sounds  Musculoskeletal: No clubbing or cyanosis, trace pitting edema  Skin: Fistula noted with ulceration  Psychiatry: Appropriate, currently not confused.  No evidence of psychosis   Data Reviewed: CBC: Recent Labs  Lab 10/06/20 1726 10/06/20 1728 10/09/20 0205 10/10/20 0301 10/12/20 1127  WBC  --  8.2 4.5 4.8 4.3  NEUTROABS  --  6.1  --   --   --   HGB 16.3* 14.7 12.4 12.2 11.3*  HCT 48.0* 46.8* 35.8* 37.0 32.8*  MCV  --  92.9 86.7 87.7 88.4  PLT  --  170 195 185 027   Basic Metabolic Panel: Recent Labs  Lab 10/06/20 1726 10/06/20 1728 10/09/20 0205 10/10/20 0301 10/12/20 1127  NA 137 141 127* 132* 133*  K 4.5 4.0 4.4 4.5 5.2*  CL 95* 90* 83* 91* 92*  CO2  --  25 24 24 22   GLUCOSE 176* 186* 85 107* 129*  BUN 29* 20 62* 37* 100*  CREATININE 4.60* 4.75* 9.86* 6.37* 12.32*  CALCIUM  --  9.1 9.6 9.3 9.2  MG  --   --  2.4 2.2  --   PHOS  --   --  10.1* 7.8* 9.0*   GFR: CrCl cannot be calculated (Unknown ideal weight.). Liver Function Tests: Recent Labs  Lab 10/06/20 1728 10/10/20 0301 10/12/20 1127  AST 18 21  --   ALT 12 13  --   ALKPHOS 61 55  --   BILITOT 0.3 0.6  --   PROT 8.3* 6.4*  --   ALBUMIN 4.0 3.3* 2.9*   No results for input(s): LIPASE, AMYLASE in the last 168 hours. Recent Labs  Lab 10/07/20 0205  AMMONIA 18   Coagulation Profile: Recent Labs   Lab 10/06/20 1728  INR 1.0   Cardiac Enzymes: No results for input(s): CKTOTAL, CKMB, CKMBINDEX, TROPONINI in the last 168 hours. BNP (last 3 results) No results for input(s): PROBNP in the last 8760 hours. HbA1C: No results for input(s): HGBA1C in the last 72 hours. CBG: Recent Labs  Lab 10/12/20 1556 10/12/20 2102 10/13/20 0008 10/13/20 0420 10/13/20 1054  GLUCAP 76 77 143* 92 135*   Lipid Profile: No results for input(s): CHOL, HDL, LDLCALC, TRIG, CHOLHDL, LDLDIRECT in the last 72 hours. Thyroid Function Tests: No results for input(s): TSH, T4TOTAL, FREET4, T3FREE, THYROIDAB in the last 72 hours. Anemia Panel: No results for input(s): VITAMINB12, FOLATE, FERRITIN, TIBC, IRON, RETICCTPCT in the last 72 hours. Urine analysis:    Component Value Date/Time   COLORURINE YELLOW 11/08/2013 1304   APPEARANCEUR CLEAR 11/08/2013 1304   LABSPEC 1.009 11/08/2013 1304   PHURINE  7.5 11/08/2013 1304   GLUCOSEU NEGATIVE 11/08/2013 1304   HGBUR NEGATIVE 11/08/2013 1304   BILIRUBINUR NEGATIVE 11/08/2013 1304   KETONESUR NEGATIVE 11/08/2013 1304   PROTEINUR NEGATIVE 11/08/2013 1304   UROBILINOGEN 0.2 11/08/2013 1304   NITRITE NEGATIVE 11/08/2013 1304   LEUKOCYTESUR NEGATIVE 11/08/2013 1304   Sepsis Labs: @LABRCNTIP (procalcitonin:4,lacticidven:4)  ) Recent Results (from the past 240 hour(s))  Resp Panel by RT-PCR (Flu A&B, Covid) Nasopharyngeal Swab     Status: None   Collection Time: 10/06/20  5:25 PM   Specimen: Nasopharyngeal Swab; Nasopharyngeal(NP) swabs in vial transport medium  Result Value Ref Range Status   SARS Coronavirus 2 by RT PCR NEGATIVE NEGATIVE Final    Comment: (NOTE) SARS-CoV-2 target nucleic acids are NOT DETECTED.  The SARS-CoV-2 RNA is generally detectable in upper respiratory specimens during the acute phase of infection. The lowest concentration of SARS-CoV-2 viral copies this assay can detect is 138 copies/mL. A negative result does not preclude  SARS-Cov-2 infection and should not be used as the sole basis for treatment or other patient management decisions. A negative result may occur with  improper specimen collection/handling, submission of specimen other than nasopharyngeal swab, presence of viral mutation(s) within the areas targeted by this assay, and inadequate number of viral copies(<138 copies/mL). A negative result must be combined with clinical observations, patient history, and epidemiological information. The expected result is Negative.  Fact Sheet for Patients:  EntrepreneurPulse.com.au  Fact Sheet for Healthcare Providers:  IncredibleEmployment.be  This test is no t yet approved or cleared by the [place] FDA and  has been authorized for detection and/or diagnosis of SARS-CoV-2 by FDA under an Emergency Use Authorization (EUA). This EUA will remain  in effect (meaning this test can be used) for the duration of the COVID-19 declaration under Section 564(b)(1) of the Act, 21 U.S.C.section 360bbb-3(b)(1), unless the authorization is terminated  or revoked sooner.       Influenza A by PCR NEGATIVE NEGATIVE Final   Influenza B by PCR NEGATIVE NEGATIVE Final    Comment: (NOTE) The Xpert Xpress SARS-CoV-2/FLU/RSV plus assay is intended as an aid in the diagnosis of influenza from Nasopharyngeal swab specimens and should not be used as a sole basis for treatment. Nasal washings and aspirates are unacceptable for Xpert Xpress SARS-CoV-2/FLU/RSV testing.  Fact Sheet for Patients: EntrepreneurPulse.com.au  Fact Sheet for Healthcare Providers: IncredibleEmployment.be  This test is not yet approved or cleared by the [place] FDA and has been authorized for detection and/or diagnosis of SARS-CoV-2 by FDA under an Emergency Use Authorization (EUA). This EUA will remain in effect (meaning this test can be used) for the duration of  the COVID-19 declaration under Section 564(b)(1) of the Act, 21 U.S.C. section 360bbb-3(b)(1), unless the authorization is terminated or revoked.  Performed at [facility], [place] [address] [place], [place] [address]   MRSA PCR Screening     Status: None   Collection Time: 10/10/20  8:35 PM   Specimen: Nasal Mucosa; Nasopharyngeal  Result Value Ref Range Status   MRSA by PCR NEGATIVE NEGATIVE Final    Comment:        The GeneXpert MRSA Assay (FDA approved for NASAL specimens only), is one component of a comprehensive MRSA colonization surveillance program. It is not intended to diagnose MRSA infection nor to guide or monitor treatment for MRSA infections. Performed at [facility], [place] [address] [place], [place] [address]       Studies: No results  found.  Scheduled Meds: .  stroke: mapping our early stages of recovery book   Does not apply Once  . (feeding supplement) PROSource Plus  30 mL Oral BID BM  . amiodarone  200 mg Oral Daily  . aspirin EC  81 mg Oral Daily  . [START ON 10/14/2020] calcitRIOL  0.5 mcg Oral Q T,Th,Sa-HD  . Chlorhexidine Gluconate Cloth  6 each Topical Q0600  . [START ON 10/14/2020] cinacalcet  60 mg Oral Q T,Th,Sa-HD  . feeding supplement (NEPRO CARB STEADY)  237 mL Oral BID BM  . metoprolol tartrate  25 mg Oral BID  . multivitamin  1 tablet Oral QHS  . rosuvastatin  10 mg Oral Daily    Continuous Infusions: . sodium chloride    . sodium chloride    . sodium chloride Stopped (10/11/20 1247)     LOS: 6 days     Annita Brod, MD [facility]   10/13/2020, 1:40 PM

## 2020-10-13 NOTE — Progress Notes (Signed)
Patient ID: [name], female   DOB: 01-21-1961, 59 y.o.   MRN: [id number]  [facility] Progress Note   Assessment/ Plan:   1.  Altered mental status: Unclear etiology with mental status gradually improving and appears to be now towards her baseline.  Work-up negative to date.  She has underlying dementia and stays at home with her daughter. 2. ESRD: She is usually on a TTS dialysis schedule and underwent hemodialysis yesterday off schedule after dialysis postponed from Thursday anticipating surgery. I will order for her next dialysis treatment tomorrow to get her back to her OP schedule, anticipating that she will still be here.  She will undergo repair of right brachiocephalic fistula by vascular surgery possibly next week or as an out-patient. 3. Anemia: Without overt blood loss and hemoglobin and hematocrit currently at goal off of ESA. 4. CKD-MBD: Significant hyperphosphatemia-continue renal diet with restart of Velphoro today for phosphorus binding. On cinacalcet for PTH control. 5. Nutrition: Mental status improving and should be able to safely tolerate oral intake. 6. Hypertension: Blood pressure at acceptable control on current antihypertensive therapy and HD.  Subjective:   Complains that she is hungry this morning and continues to have intermittent pain over RUA AVF.   Objective:   BP 135/63 (BP Location: Left Arm)   Pulse 65   Temp 97.9 F (36.6 C) (Oral)   Resp 18   Wt 45.9 kg   SpO2 100%   BMI 19.12 kg/m   Physical Exam: Gen: Comfortably resting in bed, awake/alert and more conversant today CVS: Pulse regular rhythm, normal rate, S1 and S2 normal without murmur/gallop Resp: Poor inspiratory effort with decreased breath sounds over bases, no distinct rales or rhonchi Abd: Soft, flat, mildly tender over lower quadrants, bowel sounds normal Ext: No lower extremity edema.  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  Labs: BMET Recent Labs  Lab 10/06/20 1726 10/06/20 1728 10/09/20 0205 10/10/20 0301 10/12/20 1127  NA 137 141 127* 132* 133*  K 4.5 4.0 4.4 4.5 5.2*  CL 95* 90* 83* 91* 92*  CO2  --  25 24 24 22   GLUCOSE 176* 186* 85 107* 129*  BUN 29* 20 62* 37* 100*  CREATININE 4.60* 4.75* 9.86* 6.37* 12.32*  CALCIUM  --  9.1 9.6 9.3 9.2  PHOS  --   --  10.1* 7.8* 9.0*   CBC Recent Labs  Lab 10/06/20 1728 10/09/20 0205 10/10/20 0301 10/12/20 1127  WBC 8.2 4.5 4.8 4.3  NEUTROABS 6.1  --   --   --   HGB 14.7 12.4 12.2 11.3*  HCT 46.8* 35.8* 37.0 32.8*  MCV 92.9 86.7 87.7 88.4  PLT 170 195 185 187     Medications:    .  stroke: mapping our early stages of recovery book   Does not apply Once  . (feeding supplement) PROSource Plus  30 mL Oral BID BM  . amiodarone  200 mg Oral Daily  . aspirin EC  81 mg Oral Daily  . Chlorhexidine Gluconate Cloth  6 each Topical Q0600  . feeding supplement (NEPRO CARB STEADY)  237 mL Oral BID BM  . metoprolol tartrate  25 mg Oral BID  . multivitamin  1 tablet Oral QHS  . rosuvastatin  10 mg Oral Daily   Elmarie Shiley, MD 10/13/2020, 7:35 AM

## 2020-10-14 LAB — GLUCOSE, CAPILLARY
Glucose-Capillary: 103 mg/dL — ABNORMAL HIGH (ref 70–99)
Glucose-Capillary: 118 mg/dL — ABNORMAL HIGH (ref 70–99)
Glucose-Capillary: 156 mg/dL — ABNORMAL HIGH (ref 70–99)
Glucose-Capillary: 85 mg/dL (ref 70–99)
Glucose-Capillary: 88 mg/dL (ref 70–99)

## 2020-10-14 LAB — RENAL FUNCTION PANEL
Albumin: 3 g/dL — ABNORMAL LOW (ref 3.5–5.0)
Anion gap: 15 (ref 5–15)
BUN: 80 mg/dL — ABNORMAL HIGH (ref 6–20)
CO2: 25 mmol/L (ref 22–32)
Calcium: 9.6 mg/dL (ref 8.9–10.3)
Chloride: 94 mmol/L — ABNORMAL LOW (ref 98–111)
Creatinine, Ser: 9.53 mg/dL — ABNORMAL HIGH (ref 0.44–1.00)
GFR, Estimated: 4 mL/min — ABNORMAL LOW (ref >60)
Glucose, Bld: 95 mg/dL (ref 70–99)
Phosphorus: 6.8 mg/dL — ABNORMAL HIGH (ref 2.5–4.6)
Potassium: 6 mmol/L — ABNORMAL HIGH (ref 3.5–5.1)
Sodium: 134 mmol/L — ABNORMAL LOW (ref 135–145)

## 2020-10-14 LAB — CBC
HCT: 35 % — ABNORMAL LOW (ref 36.0–46.0)
Hemoglobin: 11.3 g/dL — ABNORMAL LOW (ref 12.0–15.0)
MCH: 29.7 pg (ref 26.0–34.0)
MCHC: 32.3 g/dL (ref 30.0–36.0)
MCV: 91.9 fL (ref 80.0–100.0)
Platelets: 212 10*3/uL (ref 150–400)
RBC: 3.81 MIL/uL — ABNORMAL LOW (ref 3.87–5.11)
RDW: 14.9 % (ref 11.5–15.5)
WBC: 5.2 10*3/uL (ref 4.0–10.5)
nRBC: 0 % (ref 0.0–0.2)

## 2020-10-14 LAB — SURGICAL PCR SCREEN
MRSA, PCR: NEGATIVE
Staphylococcus aureus: NEGATIVE

## 2020-10-14 MED ORDER — CALCITRIOL 0.5 MCG PO CAPS
ORAL_CAPSULE | ORAL | Status: AC
  Administered 2020-10-14: 0.5 ug via ORAL
  Filled 2020-10-14: qty 1

## 2020-10-14 MED ORDER — SUCROFERRIC OXYHYDROXIDE 500 MG PO CHEW
1000.0000 mg | CHEWABLE_TABLET | Freq: Three times a day (TID) | ORAL | Status: DC
  Administered 2020-10-14 – 2020-10-16 (×2): 1000 mg via ORAL
  Filled 2020-10-14 (×11): qty 2

## 2020-10-14 MED ORDER — MUPIROCIN 2 % EX OINT
1.0000 "application " | TOPICAL_OINTMENT | Freq: Two times a day (BID) | CUTANEOUS | Status: DC
  Administered 2020-10-14 – 2020-10-16 (×4): 1 via NASAL
  Filled 2020-10-14: qty 22

## 2020-10-14 NOTE — Progress Notes (Signed)
Patient ID: [name], female   DOB: 1961-01-11, 59 y.o.   MRN: [id number]  [facility] Progress Note   Assessment/ Plan:   1.  Altered mental status: Unclear etiology with mental status gradually improving and appears to be now towards her baseline.  Work-up negative to date.  She has underlying dementia and stays at home with her daughter. 2. ESRD: She is usually on a TTS dialysis schedule and underwent hemodialysis yesterday off schedule after dialysis postponed from Thursday anticipating surgery.  She is on schedule for hemodialysis again today.  She will undergo repair of right brachiocephalic fistula by vascular surgery possibly next week or as an out-patient (given the extent, I anticipate that she will require a tunneled hemodialysis catheter placement at the same time as she may not have adequate cannulation room post fistula repair). 3. Anemia: Without overt blood loss and hemoglobin and hematocrit currently at goal off of ESA. 4. CKD-MBD: Significant hyperphosphatemia-continue renal diet with Velphoro today for phosphorus binding. On cinacalcet for PTH control. 5. Nutrition: Tolerating regular diet with fluid restriction.  We will switch this to renal diet to limit potassium/phosphorus. 6. Hypertension: Blood pressure elevated at this time-monitor with hemodialysis.  Subjective:   Denies any acute events overnight.  Denies any chest pain or shortness of breath.   Objective:   BP (!) 161/64 (BP Location: Right Arm)   Pulse 66   Temp 98.2 F (36.8 C)   Resp 18   Wt 47.8 kg   SpO2 100%   BMI 19.91 kg/m   Physical Exam: Gen: Appears comfortable sitting up in bed, eating breakfast CVS: Pulse regular rhythm, normal rate, S1 and S2 normal without murmur/gallop Resp: Poor inspiratory effort with decreased breath sounds over bases, no distinct rales or rhonchi Abd: Soft, flat, mildly tender over lower quadrants, bowel sounds normal Ext: No lower extremity edema.   Right upper arm brachiocephalic fistula with 2 large pulsatile aneurysms (tender) with thin skin.  Labs: BMET Recent Labs  Lab 10/09/20 0205 10/10/20 0301 10/12/20 1127  NA 127* 132* 133*  K 4.4 4.5 5.2*  CL 83* 91* 92*  CO2 24 24 22   GLUCOSE 85 107* 129*  BUN 62* 37* 100*  CREATININE 9.86* 6.37* 12.32*  CALCIUM 9.6 9.3 9.2  PHOS 10.1* 7.8* 9.0*   CBC Recent Labs  Lab 10/09/20 0205 10/10/20 0301 10/12/20 1127  WBC 4.5 4.8 4.3  HGB 12.4 12.2 11.3*  HCT 35.8* 37.0 32.8*  MCV 86.7 87.7 88.4  PLT 195 185 187     Medications:    .  stroke: mapping our early stages of recovery book   Does not apply Once  . (feeding supplement) PROSource Plus  30 mL Oral BID BM  . amiodarone  200 mg Oral Daily  . aspirin EC  81 mg Oral Daily  . calcitRIOL  0.5 mcg Oral Q T,Th,Sa-HD  . Chlorhexidine Gluconate Cloth  6 each Topical Q0600  . cinacalcet  60 mg Oral Q T,Th,Sa-HD  . feeding supplement (NEPRO CARB STEADY)  237 mL Oral BID BM  . melatonin  5 mg Oral QHS  . metoprolol tartrate  25 mg Oral BID  . multivitamin  1 tablet Oral QHS  . rosuvastatin  10 mg Oral Daily   Elmarie Shiley, MD 10/14/2020, 8:17 AM

## 2020-10-14 NOTE — Progress Notes (Addendum)
Called pt's daughter Wilford Sports) [phone] on 10/14/2020 at 2055 and she gave verbal consent for the procedure, Baxter Flattery, RN, witnessed the call. Daughter did not have any questions at this time. Consent signed and placed in the chart.

## 2020-10-14 NOTE — Procedures (Signed)
Patient seen on Hemodialysis. BP (!) 152/61   Pulse 63   Temp 98.6 F (37 C) (Oral)   Resp 14   Wt 47.8 kg   SpO2 100%   BMI 19.91 kg/m   QB 400, UF goal 1.5L Tolerating treatment without complaints at this time-- confused earlier and refusing dialysis.  Elmarie Shiley MD [facility]. Office # [phone] Pager # [phone] 2:22 PM

## 2020-10-14 NOTE — Progress Notes (Signed)
  Progress Note    10/14/2020 10:11 AM 3 Days Post-Op  Subjective: Feeling okay this morning  Vitals:   10/14/20 0428 10/14/20 0743  BP: (!) 153/65 (!) 161/64  Pulse: 68 66  Resp: 16 18  Temp: 98.5 F (36.9 C) 98.2 F (36.8 C)  SpO2: 100% 100%    Physical Exam: Awake alert oriented Right arm fistula with thin skin  CBC    Component Value Date/Time   WBC 4.3 10/12/2020 1127   RBC 3.71 (L) 10/12/2020 1127   HGB 11.3 (L) 10/12/2020 1127   HCT 32.8 (L) 10/12/2020 1127   PLT 187 10/12/2020 1127   MCV 88.4 10/12/2020 1127   MCH 30.5 10/12/2020 1127   MCHC 34.5 10/12/2020 1127   RDW 14.7 10/12/2020 1127   LYMPHSABS 1.6 10/06/2020 1728   MONOABS 0.3 10/06/2020 1728   EOSABS 0.0 10/06/2020 1728   BASOSABS 0.0 10/06/2020 1728    BMET    Component Value Date/Time   NA 133 (L) 10/12/2020 1127   K 5.2 (H) 10/12/2020 1127   CL 92 (L) 10/12/2020 1127   CO2 22 10/12/2020 1127   GLUCOSE 129 (H) 10/12/2020 1127   BUN 100 (H) 10/12/2020 1127   CREATININE 12.32 (H) 10/12/2020 1127   CALCIUM 9.2 10/12/2020 1127   GFRNONAA 3 (L) 10/12/2020 1127   GFRAA 5 (L) 02/03/2019 0652    INR    Component Value Date/Time   INR 1.0 10/06/2020 1728     Intake/Output Summary (Last 24 hours) at 10/14/2020 1011 Last data filed at 10/13/2020 1308 Gross per 24 hour  Intake 370 ml  Output --  Net 370 ml     Assessment/plan:  59 y.o. female is here with altered mental status appears to be back at baseline at this time.  We will plan for OR tomorrow for revision of right arm AV fistula and likely will require catheter placement.  We will make her n.p.o. past midnight.   Nikeia Henkes C. Donzetta Matters, MD [facility] [place] Office: [phone] Pager: [phone]  10/14/2020 10:11 AM

## 2020-10-14 NOTE — Progress Notes (Signed)
PROGRESS NOTE  Chai Verdejo QMG:[id number] DOB: Apr 27, 1961 DOA: 10/06/2020 PCP: Garwin Brothers, MD  HPI/Recap of past 50 hours: 59 year old female past medical history significant for end-stage renal disease on hemodialysis, diabetes mellitus, chronic diastolic heart failure, CAD status post stent, CVA, A. 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  Code stroke activated but by time patient was seen by neurology, symptoms have resolved.  CT negative for acute findings.  No evidence of occlusion on CTA.  She was admitted at that time for altered mental status which is since resolved.  She was found to have an elevated troponin felt to possibly be from a non-STEMI and cardiology recommended heparin for 48 hours followed by medical management only.  Given her intracranial hemorrhage and poor compliance issues, patient felt not to be a good candidate for a left heart catheterization.  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  Patient was scheduled for surgical correction on 12/23, however she is confused and cannot give consent and surgery was unable to contact family, so this was postponed. (I have since spoken to the patient's daughter who wants to proceed with surgery). Patient remains alert.  Other than feeling fatigued.  Plan is for fistula revision tomorrow.  Assessment/Plan: Principal Problem:   AMS (altered mental status)/acute encephalopathy: Possibly related to fluid shifts and electrolytes during dialysis.  Seen by neurology.  CVA ruled out.  No evidence of occlusion.  Has been a bit more alert the last 2 days. Active Problems:   Mixed hyperlipidemia: Continue Crestor.    ESRD (end stage renal disease) ([place]): Did not complete dialysis on 12/18, when she first came in because of her  symptoms.  Nephrology following.  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  Vascular surgery unable to do surgical correction due to consent.  Spoke with patient's daughter who would like to proceed, hopefully surgery can be scheduled next week.  Dialysis planned for today, 12/26 putting her back on her TTS schedule.    HTN (hypertension): Blood pressure elevated and continued on home meds.  Managing some of this with filtration.  Better in the last few days, should do well with dialysis today    Atrial fibrillation with rapid ventricular response ([place]): Converted to sinus rhythm with Cardizem and amiodarone.  Now on metoprolol.  Heart rate now controlled.  Appreciate cardiology consult.    Elevated troponin: Possibly from non-STEMI.  History of CAD with stents.  Started on heparin for 48 hours.  Stopped after 48 hours not a good candidate for long-term anticoagulation given intracranial hemorrhage.  Active candidate for cardiac cath given her intracranial hemorrhage as well as with poor compliance issues.   Code Status: Full code  Family Communication: Was able to talk to patient's daughter, Wilford Sports on 12/23.  She said she worked last night and was asleep and missed the surgeon's call.  She supports her mom having surgery and said that she can be reached at her listed cell phone: [phone] or her other phone, [phone].  Disposition Plan: Middle of the week following surgical correction of her fistula  Consultants:  Vascular surgery  Nephrology  Cardiology  Neurology  Procedures:  Hemodialysis  Echocardiogram done 4/19: No evidence of valvular dysfunction.  Diastolic function indeterminate.  Preserved ejection fraction  Antimicrobials:  None  DVT prophylaxis: Was on heparin, now SCDs   Objective: Vitals:   10/14/20 1310 10/14/20 1329  BP: (!) 158/62 (!) 152/61  Pulse:    Resp: 16 14  Temp:    SpO2:     No  intake or output data in the 24 hours ending 10/14/20 1415 Filed Weights   10/12/20 1500 10/13/20 0500 10/14/20 0428  Weight: 41.7 kg 45.9 kg 47.8 kg   Body mass index is 19.91 kg/m.  Exam: About the same from previous day  General: Alert and oriented x2, no acute distress  HEENT: Normocephalic, atraumatic, mucous membranes are slightly dry  Cardiovascular: Regular rate and rhythm, S1-S2  Respiratory: Clear to auscultation bilaterally  Abdomen: Soft, nontender, nondistended, positive bowel sounds  Musculoskeletal: No clubbing or cyanosis, trace pitting edema  Skin: Fistula noted with ulceration  Psychiatry: Appropriate, currently not confused.  No evidence of psychosis   Data Reviewed: CBC: Recent Labs  Lab 10/09/20 0205 10/10/20 0301 10/12/20 1127 10/14/20 1400  WBC 4.5 4.8 4.3 5.2  HGB 12.4 12.2 11.3* 11.3*  HCT 35.8* 37.0 32.8* 35.0*  MCV 86.7 87.7 88.4 91.9  PLT 195 185 187 993   Basic Metabolic Panel: Recent Labs  Lab 10/09/20 0205 10/10/20 0301 10/12/20 1127  NA 127* 132* 133*  K 4.4 4.5 5.2*  CL 83* 91* 92*  CO2 24 24 22   GLUCOSE 85 107* 129*  BUN 62* 37* 100*  CREATININE 9.86* 6.37* 12.32*  CALCIUM 9.6 9.3 9.2  MG 2.4 2.2  --   PHOS 10.1* 7.8* 9.0*   GFR: CrCl cannot be calculated (Unknown ideal weight.). Liver Function Tests: Recent Labs  Lab 10/10/20 0301 10/12/20 1127  AST 21  --   ALT 13  --   ALKPHOS 55  --   BILITOT 0.6  --   PROT 6.4*  --   ALBUMIN 3.3* 2.9*   No results for input(s): LIPASE, AMYLASE in the last 168 hours. No results for input(s): AMMONIA in the last 168 hours. Coagulation Profile: No results for input(s): INR, PROTIME in the last 168 hours. Cardiac Enzymes: No results for input(s): CKTOTAL, CKMB, CKMBINDEX, TROPONINI in the last 168 hours. BNP (last 3 results) No results for input(s): PROBNP in the last 8760 hours. HbA1C: No results for input(s): HGBA1C in the last 72 hours. CBG: Recent Labs  Lab  10/13/20 1535 10/13/20 2004 10/14/20 0430 10/14/20 0736 10/14/20 1149  GLUCAP 144* 119* 103* 88 156*   Lipid Profile: No results for input(s): CHOL, HDL, LDLCALC, TRIG, CHOLHDL, LDLDIRECT in the last 72 hours. Thyroid Function Tests: No results for input(s): TSH, T4TOTAL, FREET4, T3FREE, THYROIDAB in the last 72 hours. Anemia Panel: No results for input(s): VITAMINB12, FOLATE, FERRITIN, TIBC, IRON, RETICCTPCT in the last 72 hours. Urine analysis:    Component Value Date/Time   COLORURINE YELLOW 11/08/2013 [place] 11/08/2013 1304   LABSPEC 1.009 11/08/2013 1304   PHURINE 7.5 11/08/2013 1304   GLUCOSEU NEGATIVE 11/08/2013 1304   HGBUR NEGATIVE 11/08/2013 1304   BILIRUBINUR NEGATIVE 11/08/2013 1304   KETONESUR NEGATIVE 11/08/2013 1304   PROTEINUR NEGATIVE 11/08/2013 1304   UROBILINOGEN 0.2 11/08/2013 1304   NITRITE NEGATIVE 11/08/2013 1304   LEUKOCYTESUR NEGATIVE 11/08/2013 1304   Sepsis Labs: @LABRCNTIP (procalcitonin:4,lacticidven:4)  ) Recent Results (from the past 240 hour(s))  Resp Panel by RT-PCR (Flu A&B, Covid) Nasopharyngeal Swab     Status: None   Collection Time: 10/06/20  5:25 PM   Specimen: Nasopharyngeal  Swab; Nasopharyngeal(NP) swabs in vial transport medium  Result Value Ref Range Status   SARS Coronavirus 2 by RT PCR NEGATIVE NEGATIVE Final    Comment: (NOTE) SARS-CoV-2 target nucleic acids are NOT DETECTED.  The SARS-CoV-2 RNA is generally detectable in upper respiratory specimens during the acute phase of infection. The lowest concentration of SARS-CoV-2 viral copies this assay can detect is 138 copies/mL. A negative result does not preclude SARS-Cov-2 infection and should not be used as the sole basis for treatment or other patient management decisions. A negative result may occur with  improper specimen collection/handling, submission of specimen other than nasopharyngeal swab, presence of viral mutation(s) within the areas  targeted by this assay, and inadequate number of viral copies(<138 copies/mL). A negative result must be combined with clinical observations, patient history, and epidemiological information. The expected result is Negative.  Fact Sheet for Patients:  EntrepreneurPulse.com.au  Fact Sheet for Healthcare Providers:  IncredibleEmployment.be  This test is no t yet approved or cleared by the [place] FDA and  has been authorized for detection and/or diagnosis of SARS-CoV-2 by FDA under an Emergency Use Authorization (EUA). This EUA will remain  in effect (meaning this test can be used) for the duration of the COVID-19 declaration under Section 564(b)(1) of the Act, 21 U.S.C.section 360bbb-3(b)(1), unless the authorization is terminated  or revoked sooner.       Influenza A by PCR NEGATIVE NEGATIVE Final   Influenza B by PCR NEGATIVE NEGATIVE Final    Comment: (NOTE) The Xpert Xpress SARS-CoV-2/FLU/RSV plus assay is intended as an aid in the diagnosis of influenza from Nasopharyngeal swab specimens and should not be used as a sole basis for treatment. Nasal washings and aspirates are unacceptable for Xpert Xpress SARS-CoV-2/FLU/RSV testing.  Fact Sheet for Patients: EntrepreneurPulse.com.au  Fact Sheet for Healthcare Providers: IncredibleEmployment.be  This test is not yet approved or cleared by the [place] FDA and has been authorized for detection and/or diagnosis of SARS-CoV-2 by FDA under an Emergency Use Authorization (EUA). This EUA will remain in effect (meaning this test can be used) for the duration of the COVID-19 declaration under Section 564(b)(1) of the Act, 21 U.S.C. section 360bbb-3(b)(1), unless the authorization is terminated or revoked.  Performed at [facility], [place] [address] [place], [place] [address]   MRSA PCR Screening     Status: None   Collection Time: 10/10/20   8:35 PM   Specimen: Nasal Mucosa; Nasopharyngeal  Result Value Ref Range Status   MRSA by PCR NEGATIVE NEGATIVE Final    Comment:        The GeneXpert MRSA Assay (FDA approved for NASAL specimens only), is one component of a comprehensive MRSA colonization surveillance program. It is not intended to diagnose MRSA infection nor to guide or monitor treatment for MRSA infections. Performed at [facility], [place] [address] [place], [place] [address]       Studies: No results found.  Scheduled Meds: .  stroke: mapping our early stages of recovery book   Does not apply Once  . (feeding supplement) PROSource Plus  30 mL Oral BID BM  . amiodarone  200 mg Oral Daily  . aspirin EC  81 mg Oral Daily  . calcitRIOL  0.5 mcg Oral Q T,Th,Sa-HD  . Chlorhexidine Gluconate Cloth  6 each Topical Q0600  . cinacalcet  60 mg Oral Q T,Th,Sa-HD  . feeding supplement (NEPRO CARB STEADY)  237 mL Oral BID BM  . melatonin  5 mg Oral QHS  . metoprolol tartrate  25 mg Oral BID  . multivitamin  1 tablet Oral QHS  . rosuvastatin  10 mg Oral Daily  . sucroferric oxyhydroxide  1,000 mg Oral TID WC    Continuous Infusions: . sodium chloride    . sodium chloride    . sodium chloride Stopped (10/11/20 1247)     LOS: 7 days     Annita Brod, MD [facility]   10/14/2020, 2:15 PM

## 2020-10-14 NOTE — Progress Notes (Signed)
Pt refused 1600 BG.

## 2020-10-15 ENCOUNTER — Inpatient Hospital Stay (HOSPITAL_COMMUNITY): Payer: 59 | Admitting: Certified Registered"

## 2020-10-15 ENCOUNTER — Encounter (HOSPITAL_COMMUNITY): Payer: Self-pay | Admitting: Internal Medicine

## 2020-10-15 ENCOUNTER — Inpatient Hospital Stay (HOSPITAL_COMMUNITY): Payer: 59

## 2020-10-15 ENCOUNTER — Encounter (HOSPITAL_COMMUNITY)
Admission: EM | Disposition: A | Discharge status: Home / Self Care | Payer: Self-pay | Source: Ambulatory Visit | Attending: Internal Medicine

## 2020-10-15 HISTORY — PX: INSERTION OF DIALYSIS CATHETER: SHX1324

## 2020-10-15 HISTORY — PX: REVISON OF ARTERIOVENOUS FISTULA: SHX6074

## 2020-10-15 LAB — POCT I-STAT, CHEM 8
BUN: 37 mg/dL — ABNORMAL HIGH (ref 6–20)
Calcium, Ion: 1.13 mmol/L — ABNORMAL LOW (ref 1.15–1.40)
Chloride: 101 mmol/L (ref 98–111)
Creatinine, Ser: 6 mg/dL — ABNORMAL HIGH (ref 0.44–1.00)
Glucose, Bld: 78 mg/dL (ref 70–99)
HCT: 35 % — ABNORMAL LOW (ref 36.0–46.0)
Hemoglobin: 11.9 g/dL — ABNORMAL LOW (ref 12.0–15.0)
Potassium: 4.9 mmol/L (ref 3.5–5.1)
Sodium: 136 mmol/L (ref 135–145)
TCO2: 28 mmol/L (ref 22–32)

## 2020-10-15 LAB — GLUCOSE, CAPILLARY
Glucose-Capillary: 105 mg/dL — ABNORMAL HIGH (ref 70–99)
Glucose-Capillary: 75 mg/dL (ref 70–99)
Glucose-Capillary: 164 mg/dL — ABNORMAL HIGH (ref 70–99)

## 2020-10-15 SURGERY — REVISON OF ARTERIOVENOUS FISTULA
Anesthesia: General | Site: Chest | Laterality: Right

## 2020-10-15 MED ORDER — LIDOCAINE 2% (20 MG/ML) 5 ML SYRINGE
INTRAMUSCULAR | Status: AC
  Filled 2020-10-15: qty 5

## 2020-10-15 MED ORDER — LIDOCAINE 2% (20 MG/ML) 5 ML SYRINGE
INTRAMUSCULAR | Status: DC | PRN
  Administered 2020-10-15: 40 mg via INTRAVENOUS

## 2020-10-15 MED ORDER — CHLORHEXIDINE GLUCONATE 0.12 % MT SOLN: 15.0000 mL | Freq: Once | OROMUCOSAL | Status: DC

## 2020-10-15 MED ORDER — PHENYLEPHRINE 40 MCG/ML (10ML) SYRINGE FOR IV PUSH (FOR BLOOD PRESSURE SUPPORT)
PREFILLED_SYRINGE | INTRAVENOUS | Status: AC
  Filled 2020-10-15: qty 10

## 2020-10-15 MED ORDER — LIDOCAINE-EPINEPHRINE (PF) 1 %-1:200000 IJ SOLN
INTRAMUSCULAR | Status: AC
  Filled 2020-10-15: qty 30

## 2020-10-15 MED ORDER — MIDAZOLAM HCL 2 MG/2ML IJ SOLN
INTRAMUSCULAR | Status: AC
  Filled 2020-10-15: qty 2

## 2020-10-15 MED ORDER — FENTANYL CITRATE (PF) 250 MCG/5ML IJ SOLN
INTRAMUSCULAR | Status: AC
  Filled 2020-10-15: qty 5

## 2020-10-15 MED ORDER — SODIUM CHLORIDE 0.9 % IV SOLN: INTRAVENOUS | Status: DC

## 2020-10-15 MED ORDER — AMISULPRIDE (ANTIEMETIC) 5 MG/2ML IV SOLN: 10.0000 mg | Freq: Once | INTRAVENOUS | Status: DC | PRN

## 2020-10-15 MED ORDER — HEPARIN SODIUM (PORCINE) 1000 UNIT/ML IJ SOLN
INTRAMUSCULAR | Status: DC | PRN
  Administered 2020-10-15: 3000 [IU] via INTRAVENOUS

## 2020-10-15 MED ORDER — ONDANSETRON HCL 4 MG/2ML IJ SOLN
INTRAMUSCULAR | Status: AC
  Filled 2020-10-15: qty 2

## 2020-10-15 MED ORDER — ORAL CARE MOUTH RINSE: 15.0000 mL | Freq: Once | OROMUCOSAL | Status: DC

## 2020-10-15 MED ORDER — CHLORHEXIDINE GLUCONATE 0.12 % MT SOLN
OROMUCOSAL | Status: AC
  Filled 2020-10-15: qty 15

## 2020-10-15 MED ORDER — SODIUM CHLORIDE 0.9 % IV SOLN: INTRAVENOUS | Status: DC | PRN

## 2020-10-15 MED ORDER — OXYCODONE HCL 5 MG/5ML PO SOLN: 5.0000 mg | Freq: Once | ORAL | Status: DC | PRN

## 2020-10-15 MED ORDER — PROPOFOL 10 MG/ML IV BOLUS
INTRAVENOUS | Status: DC | PRN
  Administered 2020-10-15 (×3): 20 mg via INTRAVENOUS
  Administered 2020-10-15: 30 mg via INTRAVENOUS
  Administered 2020-10-15: 10 mg via INTRAVENOUS
  Administered 2020-10-15: 130 mg via INTRAVENOUS

## 2020-10-15 MED ORDER — OXYCODONE HCL 5 MG PO TABS: 5.0000 mg | ORAL_TABLET | Freq: Once | ORAL | Status: DC | PRN

## 2020-10-15 MED ORDER — HEPARIN SODIUM (PORCINE) 1000 UNIT/ML IJ SOLN
INTRAMUSCULAR | Status: AC
  Filled 2020-10-15: qty 1

## 2020-10-15 MED ORDER — PHENYLEPHRINE 40 MCG/ML (10ML) SYRINGE FOR IV PUSH (FOR BLOOD PRESSURE SUPPORT)
PREFILLED_SYRINGE | INTRAVENOUS | Status: DC | PRN
  Administered 2020-10-15: 80 ug via INTRAVENOUS

## 2020-10-15 MED ORDER — HEPARIN SODIUM (PORCINE) 1000 UNIT/ML IJ SOLN
INTRAMUSCULAR | Status: DC | PRN
  Administered 2020-10-15: 10000 [IU] via INTRAVENOUS

## 2020-10-15 MED ORDER — ONDANSETRON HCL 4 MG/2ML IJ SOLN: 4.0000 mg | Freq: Once | INTRAMUSCULAR | Status: DC | PRN

## 2020-10-15 MED ORDER — ONDANSETRON HCL 4 MG/2ML IJ SOLN
INTRAMUSCULAR | Status: DC | PRN
  Administered 2020-10-15: 4 mg via INTRAVENOUS

## 2020-10-15 MED ORDER — FENTANYL CITRATE (PF) 100 MCG/2ML IJ SOLN
25.0000 ug | INTRAMUSCULAR | Status: DC | PRN
Start: 2020-10-15 — End: 2020-10-15

## 2020-10-15 MED ORDER — HYDROMORPHONE HCL 1 MG/ML IJ SOLN: 0.2500 mg | INTRAMUSCULAR | Status: DC | PRN

## 2020-10-15 MED ORDER — DEXAMETHASONE SODIUM PHOSPHATE 10 MG/ML IJ SOLN
INTRAMUSCULAR | Status: AC
  Filled 2020-10-15: qty 1

## 2020-10-15 MED ORDER — SODIUM CHLORIDE 0.9 % IV SOLN
INTRAVENOUS | Status: AC
  Filled 2020-10-15: qty 1.2

## 2020-10-15 MED ORDER — 0.9 % SODIUM CHLORIDE (POUR BTL) OPTIME
TOPICAL | Status: DC | PRN
  Administered 2020-10-15: 10:00:00 1000 mL

## 2020-10-15 MED ORDER — LIDOCAINE-EPINEPHRINE (PF) 1 %-1:200000 IJ SOLN
INTRAMUSCULAR | Status: DC | PRN
  Administered 2020-10-15: 3.2 mL

## 2020-10-15 MED ORDER — FENTANYL CITRATE (PF) 100 MCG/2ML IJ SOLN
INTRAMUSCULAR | Status: DC | PRN
  Administered 2020-10-15 (×2): 25 ug via INTRAVENOUS
  Administered 2020-10-15: 50 ug via INTRAVENOUS

## 2020-10-15 MED ORDER — PROPOFOL 10 MG/ML IV BOLUS
INTRAVENOUS | Status: AC
  Filled 2020-10-15: qty 20

## 2020-10-15 MED ORDER — DEXAMETHASONE SODIUM PHOSPHATE 10 MG/ML IJ SOLN
INTRAMUSCULAR | Status: DC | PRN
  Administered 2020-10-15: 4 mg via INTRAVENOUS

## 2020-10-15 MED ORDER — MEPERIDINE HCL 25 MG/ML IJ SOLN: 6.2500 mg | INTRAMUSCULAR | Status: DC | PRN

## 2020-10-15 MED ORDER — CEFAZOLIN SODIUM-DEXTROSE 2-4 GM/100ML-% IV SOLN
INTRAVENOUS | Status: AC
  Filled 2020-10-15: qty 100

## 2020-10-15 SURGICAL SUPPLY — 67 items
APL PRP STRL LF DISP 70% ISPRP (MISCELLANEOUS) ×2
ARMBAND PINK RESTRICT EXTREMIT (MISCELLANEOUS) ×4 IMPLANT
BAG DECANTER FOR FLEXI CONT (MISCELLANEOUS) ×4 IMPLANT
BENZOIN TINCTURE PRP APPL 2/3 (GAUZE/BANDAGES/DRESSINGS) IMPLANT
BIOPATCH RED 1 DISK 7.0 (GAUZE/BANDAGES/DRESSINGS) ×3 IMPLANT
BIOPATCH RED 1IN DISK 7.0MM (GAUZE/BANDAGES/DRESSINGS) ×1
BNDG ELASTIC 4X5.8 VLCR STR LF (GAUZE/BANDAGES/DRESSINGS) ×4 IMPLANT
BNDG ELASTIC 6X5.8 VLCR STR LF (GAUZE/BANDAGES/DRESSINGS) ×4 IMPLANT
BNDG GAUZE ELAST 4 BULKY (GAUZE/BANDAGES/DRESSINGS) ×4 IMPLANT
CANISTER SUCT 3000ML PPV (MISCELLANEOUS) ×4 IMPLANT
CANNULA VESSEL 3MM 2 BLNT TIP (CANNULA) ×4 IMPLANT
CATH PALINDROME-P 19CM W/VT (CATHETERS) ×4 IMPLANT
CATH PALINDROME-P 23CM W/VT (CATHETERS) IMPLANT
CATH PALINDROME-P 28CM W/VT (CATHETERS) IMPLANT
CHLORAPREP W/TINT 26 (MISCELLANEOUS) ×8 IMPLANT
CLIP VESOCCLUDE MED 6/CT (CLIP) ×4 IMPLANT
CLIP VESOCCLUDE SM WIDE 6/CT (CLIP) ×4 IMPLANT
CLOSURE WOUND 1/2 X4 (GAUZE/BANDAGES/DRESSINGS)
COVER PROBE W GEL 5X96 (DRAPES) ×4 IMPLANT
COVER SURGICAL LIGHT HANDLE (MISCELLANEOUS) ×4 IMPLANT
COVER WAND RF STERILE (DRAPES) IMPLANT
DECANTER SPIKE VIAL GLASS SM (MISCELLANEOUS) ×4 IMPLANT
DRAPE C-ARM 42X72 X-RAY (DRAPES) ×4 IMPLANT
DRAPE CHEST BREAST 15X10 FENES (DRAPES) ×4 IMPLANT
DRSG COVADERM 4X8 (GAUZE/BANDAGES/DRESSINGS) ×4 IMPLANT
DRSG PAD ABDOMINAL 8X10 ST (GAUZE/BANDAGES/DRESSINGS) ×4 IMPLANT
ELECT REM PT RETURN 9FT ADLT (ELECTROSURGICAL) ×4
ELECTRODE REM PT RTRN 9FT ADLT (ELECTROSURGICAL) ×2 IMPLANT
GAUZE 4X4 16PLY RFD (DISPOSABLE) ×4 IMPLANT
GAUZE SPONGE 4X4 12PLY STRL (GAUZE/BANDAGES/DRESSINGS) ×4 IMPLANT
GLOVE ECLIPSE 6.5 STRL STRAW (GLOVE) ×4 IMPLANT
GLOVE SURG SS PI 8.0 STRL IVOR (GLOVE) IMPLANT
GLOVE SURG UNDER POLY LF SZ7 (GLOVE) ×4 IMPLANT
GOWN STRL REUS W/ TWL LRG LVL3 (GOWN DISPOSABLE) ×4 IMPLANT
GOWN STRL REUS W/ TWL XL LVL3 (GOWN DISPOSABLE) ×2 IMPLANT
GOWN STRL REUS W/TWL LRG LVL3 (GOWN DISPOSABLE) ×8
GOWN STRL REUS W/TWL XL LVL3 (GOWN DISPOSABLE) ×4
INSERT FOGARTY SM (MISCELLANEOUS) IMPLANT
KIT BASIN OR (CUSTOM PROCEDURE TRAY) ×4 IMPLANT
KIT PALINDROME-P 55CM (CATHETERS) IMPLANT
KIT TURNOVER KIT B (KITS) ×4 IMPLANT
NEEDLE 18GX1X1/2 (RX/OR ONLY) (NEEDLE) ×4 IMPLANT
NEEDLE HYPO 25GX1X1/2 BEV (NEEDLE) ×4 IMPLANT
NS IRRIG 1000ML POUR BTL (IV SOLUTION) ×4 IMPLANT
PACK CV ACCESS (CUSTOM PROCEDURE TRAY) ×4 IMPLANT
PACK SURGICAL SETUP 50X90 (CUSTOM PROCEDURE TRAY) ×4 IMPLANT
PAD ARMBOARD 7.5X6 YLW CONV (MISCELLANEOUS) ×8 IMPLANT
PENCIL SMOKE EVACUATOR (MISCELLANEOUS) ×4 IMPLANT
SET MICROPUNCTURE 5F STIFF (MISCELLANEOUS) ×4 IMPLANT
SOAP 2 % CHG 4 OZ (WOUND CARE) IMPLANT
SPONGE LAP 18X18 RF (DISPOSABLE) ×4 IMPLANT
STAPLER VISISTAT 35W (STAPLE) ×4 IMPLANT
STRIP CLOSURE SKIN 1/2X4 (GAUZE/BANDAGES/DRESSINGS) IMPLANT
SUT ETHILON 3 0 PS 1 (SUTURE) ×24 IMPLANT
SUT MNCRL AB 4-0 PS2 18 (SUTURE) ×8 IMPLANT
SUT PROLENE 5 0 C 1 24 (SUTURE) ×12 IMPLANT
SUT PROLENE 6 0 BV (SUTURE) ×4 IMPLANT
SUT VIC AB 3-0 SH 27 (SUTURE) ×20
SUT VIC AB 3-0 SH 27X BRD (SUTURE) ×10 IMPLANT
SYR 10ML LL (SYRINGE) ×8 IMPLANT
SYR 20ML LL LF (SYRINGE) ×8 IMPLANT
SYR 5ML LL (SYRINGE) ×4 IMPLANT
SYR CONTROL 10ML LL (SYRINGE) ×4 IMPLANT
TOWEL GREEN STERILE (TOWEL DISPOSABLE) ×4 IMPLANT
TOWEL GREEN STERILE FF (TOWEL DISPOSABLE) IMPLANT
UNDERPAD 30X36 HEAVY ABSORB (UNDERPADS AND DIAPERS) ×4 IMPLANT
WATER STERILE IRR 1000ML POUR (IV SOLUTION) ×4 IMPLANT

## 2020-10-15 NOTE — Anesthesia Preprocedure Evaluation (Signed)
Anesthesia Evaluation  Patient identified by MRN, date of birth, ID band Patient awake    Reviewed: Allergy & Precautions, NPO status , Patient's Chart, lab work & pertinent test results  History of Anesthesia Complications Negative for: history of anesthetic complications  Airway Mallampati: II  TM Distance: >3 FB Neck ROM: Full    Dental  (+) Teeth Intact   Pulmonary neg pulmonary ROS, former smoker,    Pulmonary exam normal        Cardiovascular hypertension, + CAD, + Cardiac Stents (2009) and + Peripheral Vascular Disease  Normal cardiovascular exam+ dysrhythmias Atrial Fibrillation      Neuro/Psych Seizures -,  CVA (ruptured cerebral aneurysm s/p coiling) negative psych ROS   GI/Hepatic negative GI ROS, Neg liver ROS,   Endo/Other  diabetes, Type 2  Renal/GU ESRF and DialysisRenal disease  negative genitourinary   Musculoskeletal negative musculoskeletal ROS (+)   Abdominal   Peds  Hematology  (+) anemia ,   Anesthesia Other Findings  Admitted from dialysis with altered mental status, now significantly improved. Found to have aneurysmal AV fistula. Planning for revision of fistula and tunneled catheter placement.  Echo 10/07/20: EF 65-70%, severe LVH, normal RV function, mild LAD, valves unremarkable  Reproductive/Obstetrics                             Anesthesia Physical Anesthesia Plan  ASA: IV  Anesthesia Plan: General   Post-op Pain Management:    Induction: Intravenous  PONV Risk Score and Plan: 3 and Ondansetron, Dexamethasone, Midazolam and Treatment may vary due to age or medical condition  Airway Management Planned: LMA  Additional Equipment: None  Intra-op Plan:   Post-operative Plan: Extubation in OR  Informed Consent: I have reviewed the patients History and Physical, chart, labs and discussed the procedure including the risks, benefits and alternatives for  the proposed anesthesia with the patient or authorized representative who has indicated his/her understanding and acceptance.     Dental advisory given  Plan Discussed with:   Anesthesia Plan Comments:         Anesthesia Quick Evaluation

## 2020-10-15 NOTE — Progress Notes (Signed)
PT Cancellation Note  Patient Details Name: [name] MRN: [id number] DOB: [birth year]   Cancelled Treatment:    Reason Eval/Treat Not Completed: Patient at procedure or test/unavailable - to OR for revision of right arm AV fistula and tunneled dialysis catheter placement. Will check back as schedule allows.   Stacie Glaze, PT [facility] Pager [phone]  Office [phone]    Louis Matte 10/15/2020, 11:20 AM

## 2020-10-15 NOTE — Progress Notes (Signed)
°  Progress Note    10/15/2020 10:31 AM  Subjective: oriented in preop area. No complaints.  Vitals:   10/15/20 0408 10/15/20 0831  BP: 130/69 127/76  Pulse: 72 67  Resp: 19 16  Temp: 98 F (36.7 C) 97.7 F (36.5 C)  SpO2: 100% 100%    Physical Exam: Awake alert oriented Right arm fistula with thin skin  CBC    Component Value Date/Time   WBC 5.2 10/14/2020 1400   RBC 3.81 (L) 10/14/2020 1400   HGB 11.9 (L) 10/15/2020 1004   HCT 35.0 (L) 10/15/2020 1004   PLT 212 10/14/2020 1400   MCV 91.9 10/14/2020 1400   MCH 29.7 10/14/2020 1400   MCHC 32.3 10/14/2020 1400   RDW 14.9 10/14/2020 1400   LYMPHSABS 1.6 10/06/2020 1728   MONOABS 0.3 10/06/2020 1728   EOSABS 0.0 10/06/2020 1728   BASOSABS 0.0 10/06/2020 1728    BMET    Component Value Date/Time   NA 136 10/15/2020 1004   K 4.9 10/15/2020 1004   CL 101 10/15/2020 1004   CO2 25 10/14/2020 1400   GLUCOSE 78 10/15/2020 1004   BUN 37 (H) 10/15/2020 1004   CREATININE 6.00 (H) 10/15/2020 1004   CALCIUM 9.6 10/14/2020 1400   GFRNONAA 4 (L) 10/14/2020 1400   GFRAA 5 (L) 02/03/2019 0652    INR    Component Value Date/Time   INR 1.0 10/06/2020 1728     Intake/Output Summary (Last 24 hours) at 10/15/2020 1031 Last data filed at 10/14/2020 1606 Gross per 24 hour  Intake --  Output 1000 ml  Net -1000 ml     Assessment/plan:  59 y.o. female is here with altered mental status appears to be back at baseline at this time.  We will plan for OR for revision of right arm AV fistula and tunneled dialysis catheter placement.   [name]. [name], MD [facility] Phone Number: [phone] 10/15/2020 10:32 AM

## 2020-10-15 NOTE — Transfer of Care (Signed)
Immediate Anesthesia Transfer of Care Note  Patient: [name]  Procedure(s) Performed: REVISON OF ARTERIOVENOUS FISTULA ARM (Right Arm Upper) INSERTION OF TUNNELED DIALYSIS CATHETER (Right Chest)  Patient Location: PACU  Anesthesia Type:General  Level of Consciousness: drowsy and patient cooperative  Airway & Oxygen Therapy: Patient Spontanous Breathing and Patient connected to nasal cannula oxygen  Post-op Assessment: Report given to RN, Post -op Vital signs reviewed and stable and Patient moving all extremities  Post vital signs: Reviewed and stable  Last Vitals:  Vitals Value Taken Time  BP    Temp    Pulse 76 10/15/20 1256  Resp 17 10/15/20 1256  SpO2 11 % 10/15/20 1256  Vitals shown include unvalidated device data.  Last Pain:  Vitals:   10/15/20 0831  TempSrc: Oral  PainSc:          Complications: No complications documented.

## 2020-10-15 NOTE — Anesthesia Postprocedure Evaluation (Signed)
Anesthesia Post Note  Patient: [name]  Procedure(s) Performed: REVISON OF ARTERIOVENOUS FISTULA ARM (Right Arm Upper) INSERTION OF TUNNELED DIALYSIS CATHETER (Right Chest)     Patient location during evaluation: PACU Anesthesia Type: General Level of consciousness: sedated Pain management: pain level controlled Vital Signs Assessment: post-procedure vital signs reviewed and stable Respiratory status: spontaneous breathing and respiratory function stable Cardiovascular status: stable Postop Assessment: no apparent nausea or vomiting Anesthetic complications: no   No complications documented.  Last Vitals:  Vitals:   10/15/20 1310 10/15/20 1327  BP: (!) 109/54 (!) 128/53  Pulse:    Resp: 14 15  Temp:  (!) 36.3 C  SpO2: 97% 100%    Last Pain:  Vitals:   10/15/20 0831  TempSrc: Oral  PainSc:                  [name]

## 2020-10-15 NOTE — Progress Notes (Signed)
Looks good post op. Bulky RUE dressing clean and dry. CXR looks OK - OK to use catheter at any point. Do not cannulate RUE AVF for at least six weeks. Follow up with PA in our clinic for wound check. Will check again in the AM. Please call for questions.  Yevonne Aline. [name], MD [facility] Phone Number: [phone] 10/15/2020 3:52 PM

## 2020-10-15 NOTE — Op Note (Signed)
DATE OF SERVICE: 10/15/2020  PATIENT:  [name]  59 y.o. female  PRE-OPERATIVE DIAGNOSIS:  ESRD  POST-OPERATIVE DIAGNOSIS:  end stage renal disease  PROCEDURE:  Procedure(s): REVISON OF ARTERIOVENOUS FISTULA ARM (Right) INSERTION OF TUNNELED DIALYSIS CATHETER (Right)  SURGEON:  Surgeon(s) and Role:    * Cherre Robins, MD - Primary  ASSISTANT: Paulo Fruit, PA-C  An assistant was required to facilitate exposure and expedite the case.  ANESTHESIA:   epidural  EBL: 239mL  BLOOD ADMINISTERED:none  DRAINS: none   LOCAL MEDICATIONS USED:  NONE  SPECIMEN:  none  COUNTS: confirmed correct.  TOURNIQUET:  * No tourniquets in log *  PATIENT DISPOSITION:  PACU - hemodynamically stable.   Delay start of Pharmacological VTE agent (>24hrs) due to surgical blood loss or risk of bleeding: yes  INDICATION FOR PROCEDURE: [name] is a 59 y.o. female with ESRD and anuerysmal LUE brachial-basilic AVF that has caused two areas of skin thinning and impending ulceration. After careful discussion of risks, benefits, and alternatives the patient was offered revision of AVF and TDC placement. The patient understood and wished to proceed.  OPERATIVE FINDINGS: excision of two areas of aneurysmal degeneration and end-to-end reconstruction  DESCRIPTION OF PROCEDURE: After identification of the patient in the pre-operative holding area, the patient was transferred to the operating room. The patient was positioned supine on the operating room table. Anesthesia was induced. The neck, chest, and right arm were prepped and draped in standard fashion. A surgical pause was performed confirming correct patient, procedure, and operative location.  Using ultrasound guidance the right internal jugular vein was accessed with micropuncture technique.  Through the micropuncture sheath a floppy J-wire was advanced into the superior vena cava.  A small incision was made around the skin access point.  The  access point was serially dilated under direct fluoroscopic guidance.  A peel-away sheath was introduced into the superior vena cava under fluoroscopic guidance.  A counterincision was made in the chest under the clavicle.  A 19 cm tunnel dialysis catheter was then tunneled under the skin, over the clavicle into the incision in the neck.  The tunneling device was removed and the catheter fed through the peel-away sheath into the superior vena cava.  The peel-away sheath was removed and the catheter gently pulled back.  Adequate position was confirmed with x-ray.  The catheter was tested and found to flush and draw back well.  Catheter was heparin locked.  Caps were applied.  Catheter was sutured to the skin.  The neck incision was closed with 4-0 Monocryl.  A longitudinal incision was made over the right upper extremity arteriovenous fistula. The incision was carried down through the subcutaneous tissue until the fistula was encountered proximally and distally. The incision elipsed around the two areas of diseased skin so they would be excised. The proximal and distal fistula was encircled with silastic vessel loops. The entire intervening segment of fistula was skeletonized from the subcutaneous tissue. The patient was heparinized with 3000 units of IV heparin. Clamps were applied to the distal and proximal fistula. The aneurysmal segment was excised. The fistula was reconstructed with a end-to-end anastomosis with 5-O prolene. One repair stitch was needed. The clamps were released and hemostasis was ensured in the anastomosis. Diffuse oozing was noted throughout the surgical bed that did not improve with extensive electrocautery. The wound was closed in layers using 3-O vicryl, 3-O nylon and staples to ensure any ooze would drain and not cause a hematoma.  A bulky dressing was applied.  Upon completion of the case instrument and sharps counts were confirmed correct. The patient was transferred to the PACU in  good condition. I was present for all portions of the procedure.  Yevonne Aline. [name], MD [facility] Phone Number: [phone] 10/15/2020 12:48 PM

## 2020-10-15 NOTE — Progress Notes (Signed)
Patient ID: [name], female   DOB: 06-Jan-1961, 59 y.o.   MRN: [id number]  [facility] Progress Note   Assessment/ Plan:   1.  Altered mental status: Unclear etiology with mental status gradually improving and appears to be now towards her baseline.  Work-up negative to date.  She has underlying dementia and stays at home with her daughter. 2. ESRD: cont HD TTS. Underwent plication of L arm AVF today per VVS, also TDC placement. Due for HD tomorrow.  3. Anemia: Without overt blood loss and hemoglobin and hematocrit currently at goal off of ESA. 4. CKD-MBD: Significant hyperphosphatemia-continue renal diet with Velphoro today for phosphorus binding. On cinacalcet for PTH control. 5. Nutrition: Tolerating regular diet with fluid restriction.  We will switch this to renal diet to limit potassium/phosphorus. 6. Hypertension: Blood pressure elevated at this time-monitor with hemodialysis.  Kelly Splinter, MD 10/15/2020, 4:01 PM   Subjective:   No c/o's, wants to eat solid food.    Objective:   BP (!) 149/74 (BP Location: Left Arm)   Pulse 64   Temp (!) 97.3 F (36.3 C) (Oral)   Resp 18   Wt 42.2 kg   SpO2 99%   BMI 17.58 kg/m   Physical Exam: Gen: Appears comfortable  CVS: Pulse regular rhythm, normal rate, S1 and S2 normal without murmur/gallop Resp: Poor inspiratory effort with decreased breath sounds over bases, no distinct rales or rhonchi Abd: Soft, flat, mildly tender over lower quadrants, bowel sounds normal Ext: No lower extremity edema.  R arm wrapped Neuro: Ox 2.5  OP HD: [facility] 4h  400/ 800   37kg   2/2 bath   Hep none  IJ TDC placed here (RUA AVF sp plication on 70/35/00) Sensipar 120mg  qHD Calcitriol 72mcg qHD  Labs: BMET Recent Labs  Lab 10/09/20 0205 10/10/20 0301 10/12/20 1127 10/14/20 1400 10/15/20 1004  NA 127* 132* 133* 134* 136  K 4.4 4.5 5.2* 6.0* 4.9  CL 83* 91* 92* 94* 101  CO2 24 24 22 25   --   GLUCOSE 85 107* 129* 95 78  BUN  62* 37* 100* 80* 37*  CREATININE 9.86* 6.37* 12.32* 9.53* 6.00*  CALCIUM 9.6 9.3 9.2 9.6  --   PHOS 10.1* 7.8* 9.0* 6.8*  --    CBC Recent Labs  Lab 10/09/20 0205 10/10/20 0301 10/12/20 1127 10/14/20 1400 10/15/20 1004  WBC 4.5 4.8 4.3 5.2  --   HGB 12.4 12.2 11.3* 11.3* 11.9*  HCT 35.8* 37.0 32.8* 35.0* 35.0*  MCV 86.7 87.7 88.4 91.9  --   PLT 195 185 187 212  --      Medications:    .  stroke: mapping our early stages of recovery book   Does not apply Once  . (feeding supplement) PROSource Plus  30 mL Oral BID BM  . amiodarone  200 mg Oral Daily  . aspirin EC  81 mg Oral Daily  . calcitRIOL  0.5 mcg Oral Q T,Th,Sa-HD  . chlorhexidine      . Chlorhexidine Gluconate Cloth  6 each Topical Q0600  . cinacalcet  60 mg Oral Q T,Th,Sa-HD  . feeding supplement (NEPRO CARB STEADY)  237 mL Oral BID BM  . melatonin  5 mg Oral QHS  . metoprolol tartrate  25 mg Oral BID  . multivitamin  1 tablet Oral QHS  . mupirocin ointment  1 application Nasal BID  . rosuvastatin  10 mg Oral Daily  . sucroferric oxyhydroxide  1,000 mg  Oral TID WC

## 2020-10-15 NOTE — Anesthesia Procedure Notes (Signed)
Procedure Name: LMA Insertion Date/Time: 10/15/2020 10:44 AM Performed by: Moshe Salisbury, CRNA Pre-anesthesia Checklist: Patient identified, Emergency Drugs available, Suction available and Patient being monitored Patient Re-evaluated:Patient Re-evaluated prior to induction Oxygen Delivery Method: Circle System Utilized Preoxygenation: Pre-oxygenation with 100% oxygen Induction Type: IV induction Ventilation: Mask ventilation without difficulty LMA: LMA inserted LMA Size: 4.0 Number of attempts: 1 Placement Confirmation: positive ETCO2 Tube secured with: Tape Dental Injury: Teeth and Oropharynx as per pre-operative assessment

## 2020-10-15 NOTE — Progress Notes (Signed)
PROGRESS NOTE  [name] SWF:[id number] DOB: February 04, 1961 DOA: 10/06/2020 PCP: Garwin Brothers, MD  HPI/Recap of past 32 hours: 59 year old female past medical history significant for end-stage renal disease on hemodialysis, diabetes mellitus, chronic diastolic heart failure, CAD status post stent, CVA, A. 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  Code stroke activated but by time patient was seen by neurology, symptoms have resolved.  CT negative for acute findings.  No evidence of occlusion on CTA.  She was admitted at that time for altered mental status which is since resolved.  She was found to have an elevated troponin felt to possibly be from a non-STEMI and cardiology recommended heparin for 48 hours followed by medical management only.  Given her intracranial hemorrhage and poor compliance issues, patient felt not to be a good candidate for a left heart catheterization.  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  Patient was initially scheduled for surgical correction on 12/23, however this was delayed due to patient unable to give consent and unable to get consent from family.  However, this was able to be done on morning of 12/27 after consent obtained from daughter.  Patient also had tunneled dialysis catheter placed.  She is currently doing well, sleeping  Assessment/Plan: Principal Problem:   AMS (altered mental status)/acute encephalopathy: Possibly related to fluid shifts and electrolytes during dialysis.  Seen by neurology.  CVA ruled out.  No evidence of occlusion.  Much more alert now Active Problems:   Mixed hyperlipidemia: Continue Crestor.    ESRD (end stage renal disease) ([place]): Did not complete dialysis on 12/18, when she first came in because of her symptoms.  Nephrology  following.  Currently on schedule, TTS.  Patient underwent revision of right AV fistula after it became ulcerated and also had insertion of tunneled dialysis catheter.    HTN (hypertension): Blood pressure elevated and continued on home meds.  Managing some of this with filtration.  Stable.    Atrial fibrillation with rapid ventricular response ([place]): Converted to sinus rhythm with Cardizem and amiodarone.  Now on metoprolol.  Heart rate now controlled.  Appreciate cardiology consult.    Elevated troponin: Possibly from non-STEMI.  History of CAD with stents.  Started on heparin for 48 hours.  Stopped after 48 hours not a good candidate for long-term anticoagulation given intracranial hemorrhage.  Active candidate for cardiac cath given her intracranial hemorrhage as well as with poor compliance issues.  Code Status: Full code  Family Communication: Updated daughter by phone.  Disposition Plan: Home health PT with 24-hour supervision. Anticipate discharge in the next 1 to 2 days  Consultants:  Vascular surgery  Nephrology  Cardiology  Neurology  Procedures:  Hemodialysis  Echocardiogram done 12/19: No evidence of valvular dysfunction.  Diastolic function indeterminate.  Preserved ejection fraction  Correction of right upper extremity fistula plus placement of tunneled dialysis catheter done 12/27  Antimicrobials:  None  DVT prophylaxis: Was on heparin, now SCDs   Objective: Vitals:   10/15/20 1327 10/15/20 1355  BP: (!) 128/53 (!) 147/65  Pulse:  (!) 59  Resp: 15 17  Temp: (!) 97.4 F (36.3 C) 97.6 F (36.4 C)  SpO2: 100% 98%    Intake/Output Summary (Last 24 hours) at 10/15/2020 1438 Last data filed at 10/15/2020 1231 Gross per 24 hour  Intake 350 ml  Output 1100 ml  Net -750 ml   Filed Weights   10/14/20 1300 10/14/20 1606 10/15/20 0500  Weight: 41.7 kg 40.7 kg 42.2 kg   Body mass index is 17.58 kg/m.  Exam:  General: Seen postop,  somnolent  HEENT: Normocephalic, atraumatic, mucous membranes are slightly dry  Cardiovascular: Regular rate and rhythm, S1-S2  Respiratory: Clear to auscultation bilaterally  Abdomen: Soft, nontender, nondistended, positive bowel sounds  Musculoskeletal: No clubbing or cyanosis, trace pitting edema  Skin: Right upper extremity bandaged, catheter placed  Psychiatry: Appropriate, currently not confused.  No evidence of psychosis   Data Reviewed: CBC: Recent Labs  Lab 10/09/20 0205 10/10/20 0301 10/12/20 1127 10/14/20 1400 10/15/20 1004  WBC 4.5 4.8 4.3 5.2  --   HGB 12.4 12.2 11.3* 11.3* 11.9*  HCT 35.8* 37.0 32.8* 35.0* 35.0*  MCV 86.7 87.7 88.4 91.9  --   PLT 195 185 187 212  --    Basic Metabolic Panel: Recent Labs  Lab 10/09/20 0205 10/10/20 0301 10/12/20 1127 10/14/20 1400 10/15/20 1004  NA 127* 132* 133* 134* 136  K 4.4 4.5 5.2* 6.0* 4.9  CL 83* 91* 92* 94* 101  CO2 24 24 22 25   --   GLUCOSE 85 107* 129* 95 78  BUN 62* 37* 100* 80* 37*  CREATININE 9.86* 6.37* 12.32* 9.53* 6.00*  CALCIUM 9.6 9.3 9.2 9.6  --   MG 2.4 2.2  --   --   --   PHOS 10.1* 7.8* 9.0* 6.8*  --    GFR: CrCl cannot be calculated (Unknown ideal weight.). Liver Function Tests: Recent Labs  Lab 10/10/20 0301 10/12/20 1127 10/14/20 1400  AST 21  --   --   ALT 13  --   --   ALKPHOS 55  --   --   BILITOT 0.6  --   --   PROT 6.4*  --   --   ALBUMIN 3.3* 2.9* 3.0*   No results for input(s): LIPASE, AMYLASE in the last 168 hours. No results for input(s): AMMONIA in the last 168 hours. Coagulation Profile: No results for input(s): INR, PROTIME in the last 168 hours. Cardiac Enzymes: No results for input(s): CKTOTAL, CKMB, CKMBINDEX, TROPONINI in the last 168 hours. BNP (last 3 results) No results for input(s): PROBNP in the last 8760 hours. HbA1C: No results for input(s): HGBA1C in the last 72 hours. CBG: Recent Labs  Lab 10/14/20 1149 10/14/20 2021 10/14/20 2346  10/15/20 0409 10/15/20 0740  GLUCAP 156* 118* 85 105* 75   Lipid Profile: No results for input(s): CHOL, HDL, LDLCALC, TRIG, CHOLHDL, LDLDIRECT in the last 72 hours. Thyroid Function Tests: No results for input(s): TSH, T4TOTAL, FREET4, T3FREE, THYROIDAB in the last 72 hours. Anemia Panel: No results for input(s): VITAMINB12, FOLATE, FERRITIN, TIBC, IRON, RETICCTPCT in the last 72 hours. Urine analysis:    Component Value Date/Time   COLORURINE YELLOW 11/08/2013 [place] 11/08/2013 1304   LABSPEC 1.009 11/08/2013 1304   PHURINE 7.5 11/08/2013 1304   GLUCOSEU NEGATIVE 11/08/2013 1304   HGBUR NEGATIVE 11/08/2013 1304   BILIRUBINUR NEGATIVE 11/08/2013 1304   KETONESUR NEGATIVE 11/08/2013 1304   PROTEINUR NEGATIVE 11/08/2013 1304   UROBILINOGEN 0.2 11/08/2013 1304   NITRITE NEGATIVE 11/08/2013 1304   LEUKOCYTESUR NEGATIVE 11/08/2013 1304   Sepsis Labs: @LABRCNTIP (procalcitonin:4,lacticidven:4)  ) Recent Results (from the past 240 hour(s))  Resp Panel by RT-PCR (Flu A&B, Covid) Nasopharyngeal Swab     Status: None   Collection  Time: 10/06/20  5:25 PM   Specimen: Nasopharyngeal Swab; Nasopharyngeal(NP) swabs in vial transport medium  Result Value Ref Range Status   SARS Coronavirus 2 by RT PCR NEGATIVE NEGATIVE Final    Comment: (NOTE) SARS-CoV-2 target nucleic acids are NOT DETECTED.  The SARS-CoV-2 RNA is generally detectable in upper respiratory specimens during the acute phase of infection. The lowest concentration of SARS-CoV-2 viral copies this assay can detect is 138 copies/mL. A negative result does not preclude SARS-Cov-2 infection and should not be used as the sole basis for treatment or other patient management decisions. A negative result may occur with  improper specimen collection/handling, submission of specimen other than nasopharyngeal swab, presence of viral mutation(s) within the areas targeted by this assay, and inadequate number of  viral copies(<138 copies/mL). A negative result must be combined with clinical observations, patient history, and epidemiological information. The expected result is Negative.  Fact Sheet for Patients:  EntrepreneurPulse.com.au  Fact Sheet for Healthcare Providers:  IncredibleEmployment.be  This test is no t yet approved or cleared by the [place] FDA and  has been authorized for detection and/or diagnosis of SARS-CoV-2 by FDA under an Emergency Use Authorization (EUA). This EUA will remain  in effect (meaning this test can be used) for the duration of the COVID-19 declaration under Section 564(b)(1) of the Act, 21 U.S.C.section 360bbb-3(b)(1), unless the authorization is terminated  or revoked sooner.       Influenza A by PCR NEGATIVE NEGATIVE Final   Influenza B by PCR NEGATIVE NEGATIVE Final    Comment: (NOTE) The Xpert Xpress SARS-CoV-2/FLU/RSV plus assay is intended as an aid in the diagnosis of influenza from Nasopharyngeal swab specimens and should not be used as a sole basis for treatment. Nasal washings and aspirates are unacceptable for Xpert Xpress SARS-CoV-2/FLU/RSV testing.  Fact Sheet for Patients: EntrepreneurPulse.com.au  Fact Sheet for Healthcare Providers: IncredibleEmployment.be  This test is not yet approved or cleared by the [place] FDA and has been authorized for detection and/or diagnosis of SARS-CoV-2 by FDA under an Emergency Use Authorization (EUA). This EUA will remain in effect (meaning this test can be used) for the duration of the COVID-19 declaration under Section 564(b)(1) of the Act, 21 U.S.C. section 360bbb-3(b)(1), unless the authorization is terminated or revoked.  Performed at [facility], [place] [address] [place], [place] [address]   MRSA PCR Screening     Status: None   Collection Time: 10/10/20  8:35 PM   Specimen: Nasal Mucosa; Nasopharyngeal   Result Value Ref Range Status   MRSA by PCR NEGATIVE NEGATIVE Final    Comment:        The GeneXpert MRSA Assay (FDA approved for NASAL specimens only), is one component of a comprehensive MRSA colonization surveillance program. It is not intended to diagnose MRSA infection nor to guide or monitor treatment for MRSA infections. Performed at [facility], [place] [address] [place],  [address]   Surgical PCR screen     Status: None   Collection Time: 10/14/20  7:52 PM   Specimen: Nasal Mucosa; Nasal Swab  Result Value Ref Range Status   MRSA, PCR NEGATIVE NEGATIVE Final   Staphylococcus aureus NEGATIVE NEGATIVE Final    Comment: (NOTE) The Xpert SA Assay (FDA approved for NASAL specimens in patients 30 years of age and older), is one component of a comprehensive surveillance program. It is not intended to diagnose infection nor to guide or monitor treatment. Performed at [facility], [place] [address] [place], [place] [address]       Studies: DG Chest Port 1 View  Result Date: 10/15/2020 CLINICAL DATA:  Post dialysis catheter placement EXAM: PORTABLE CHEST 1 VIEW COMPARISON:  10/06/2020 FINDINGS: New right IJ tunneled dialysis catheter tip overlies the cavoatrial junction. Partially imaged shunt catheter traversing the right chest. No new consolidation. No pleural effusion or pneumothorax. Stable cardiomediastinal contours. IMPRESSION: New right IJ tunneled dialysis catheter tip overlies the cavoatrial junction. No pneumothorax. Electronically Signed   By: Macy Mis M.D.   On: 10/15/2020 13:22   DG Fluoro Guide CV Line-No Report  Result Date: 10/15/2020 Fluoroscopy was utilized by the requesting physician.  No radiographic interpretation.    Scheduled Meds: .  stroke: mapping our early stages of recovery book   Does not apply Once  . (feeding supplement) PROSource Plus  30 mL Oral BID BM  . amiodarone  200 mg Oral Daily  . aspirin EC  81 mg Oral  Daily  . calcitRIOL  0.5 mcg Oral Q T,Th,Sa-HD  . chlorhexidine      . Chlorhexidine Gluconate Cloth  6 each Topical Q0600  . cinacalcet  60 mg Oral Q T,Th,Sa-HD  . feeding supplement (NEPRO CARB STEADY)  237 mL Oral BID BM  . melatonin  5 mg Oral QHS  . metoprolol tartrate  25 mg Oral BID  . multivitamin  1 tablet Oral QHS  . mupirocin ointment  1 application Nasal BID  . rosuvastatin  10 mg Oral Daily  . sucroferric oxyhydroxide  1,000 mg Oral TID WC    Continuous Infusions: . sodium chloride    . sodium chloride    . sodium chloride Stopped (10/11/20 1247)  . ceFAZolin       LOS: 8 days     Annita Brod, MD [facility]   10/15/2020, 2:38 PM

## 2020-10-16 ENCOUNTER — Encounter (HOSPITAL_COMMUNITY): Payer: Self-pay | Admitting: Vascular Surgery

## 2020-10-16 DIAGNOSIS — I214 Non-ST elevation (NSTEMI) myocardial infarction: Secondary | ICD-10-CM | POA: Diagnosis present

## 2020-10-16 LAB — CBC
HCT: 29.1 % — ABNORMAL LOW (ref 36.0–46.0)
HCT: 32 % — ABNORMAL LOW (ref 36.0–46.0)
Hemoglobin: 9.3 g/dL — ABNORMAL LOW (ref 12.0–15.0)
Hemoglobin: 10.9 g/dL — ABNORMAL LOW (ref 12.0–15.0)
MCH: 29.2 pg (ref 26.0–34.0)
MCH: 30.6 pg (ref 26.0–34.0)
MCHC: 32 g/dL (ref 30.0–36.0)
MCHC: 34.1 g/dL (ref 30.0–36.0)
MCV: 91.5 fL (ref 80.0–100.0)
MCV: 89.9 fL (ref 80.0–100.0)
Platelets: 194 10*3/uL (ref 150–400)
Platelets: 220 10*3/uL (ref 150–400)
RBC: 3.18 MIL/uL — ABNORMAL LOW (ref 3.87–5.11)
RBC: 3.56 MIL/uL — ABNORMAL LOW (ref 3.87–5.11)
RDW: 14.8 % (ref 11.5–15.5)
RDW: 15.1 % (ref 11.5–15.5)
WBC: 8.3 10*3/uL (ref 4.0–10.5)
WBC: 8.2 10*3/uL (ref 4.0–10.5)
nRBC: 0 % (ref 0.0–0.2)
nRBC: 0 % (ref 0.0–0.2)

## 2020-10-16 LAB — GLUCOSE, CAPILLARY
Glucose-Capillary: 103 mg/dL — ABNORMAL HIGH (ref 70–99)
Glucose-Capillary: 112 mg/dL — ABNORMAL HIGH (ref 70–99)
Glucose-Capillary: 121 mg/dL — ABNORMAL HIGH (ref 70–99)
Glucose-Capillary: 125 mg/dL — ABNORMAL HIGH (ref 70–99)
Glucose-Capillary: 89 mg/dL (ref 70–99)
Glucose-Capillary: 89 mg/dL (ref 70–99)

## 2020-10-16 LAB — BASIC METABOLIC PANEL
Anion gap: 14 (ref 5–15)
BUN: 52 mg/dL — ABNORMAL HIGH (ref 6–20)
CO2: 24 mmol/L (ref 22–32)
Calcium: 9.3 mg/dL (ref 8.9–10.3)
Chloride: 96 mmol/L — ABNORMAL LOW (ref 98–111)
Creatinine, Ser: 7.43 mg/dL — ABNORMAL HIGH (ref 0.44–1.00)
GFR, Estimated: 6 mL/min — ABNORMAL LOW (ref >60)
Glucose, Bld: 104 mg/dL — ABNORMAL HIGH (ref 70–99)
Potassium: 5.9 mmol/L — ABNORMAL HIGH (ref 3.5–5.1)
Sodium: 134 mmol/L — ABNORMAL LOW (ref 135–145)

## 2020-10-16 MED ORDER — HEPARIN SODIUM (PORCINE) 1000 UNIT/ML IJ SOLN: 3200.0000 [IU] | INTRAMUSCULAR | Status: DC

## 2020-10-16 MED ORDER — ROSUVASTATIN CALCIUM 10 MG PO TABS: 10.0000 mg | ORAL_TABLET | Freq: Every day | ORAL | 1 refills | Status: DC

## 2020-10-16 MED ORDER — NEPRO/CARBSTEADY PO LIQD: 237.0000 mL | Freq: Two times a day (BID) | ORAL | 0 refills | Status: DC

## 2020-10-16 MED ORDER — CINACALCET HCL 30 MG PO TABS
120.0000 mg | ORAL_TABLET | ORAL | Status: DC
  Administered 2020-10-16: 11:00:00 120 mg via ORAL
  Filled 2020-10-16: qty 4

## 2020-10-16 MED ORDER — CINACALCET HCL 30 MG PO TABS: 120.0000 mg | ORAL_TABLET | ORAL | 1 refills | Status: DC

## 2020-10-16 MED ORDER — CALCITRIOL 0.5 MCG PO CAPS
ORAL_CAPSULE | ORAL | Status: AC
  Administered 2020-10-16: 0.5 ug via ORAL
  Filled 2020-10-16: qty 1

## 2020-10-16 MED ORDER — HEPARIN SODIUM (PORCINE) 1000 UNIT/ML IJ SOLN
INTRAMUSCULAR | Status: AC
  Administered 2020-10-16: 3200 [IU] via INTRAVENOUS
  Filled 2020-10-16: qty 4

## 2020-10-16 MED ORDER — PROSOURCE PLUS PO LIQD: 30.0000 mL | Freq: Two times a day (BID) | ORAL | Status: DC

## 2020-10-16 MED ORDER — METOPROLOL TARTRATE 25 MG PO TABS: 25.0000 mg | ORAL_TABLET | Freq: Two times a day (BID) | ORAL | 1 refills | Status: DC

## 2020-10-16 MED ORDER — OXYCODONE-ACETAMINOPHEN 5-325 MG PO TABS: 1.0000 | ORAL_TABLET | ORAL | Status: DC | PRN

## 2020-10-16 NOTE — Progress Notes (Signed)
Physical Therapy Treatment Patient Details Name: [name] MRN: [id number] DOB: [birth year] Today's Date: 10/16/2020    History of Present Illness This 59 y.o. female with PMH significant for ESRD on HD, T2DM, HLD, CAD with stents, chronic diastolic CHF, A. 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  Patient was brought to the ED on 12/18 from dialysis center for evaluation of decreased responsiveness.  Pt admitted with acute encephalopathy, afib with RVR, and elevated troponin/NSTEMI with cardio consulted. s/p revision R AV fistula, insertion of tunneled dialysis catheter on 12/27.    PT Comments    Pt complaining of moderate to severe RUE pain, with distal swelling noted s/p AV fistula revision yesterday. Pt agreeable to OOB mobility, requiring close guard overall for safety and very increased time. Pt ambulatory in hallway with RW today, although pt demonstrates difficulty gripping on R. RN notified of RUE swelling, PT elevating pt's RUE on pillows to aid in edema management. Will continue to follow acutely.     Follow Up Recommendations  Supervision/Assistance - 24 hour;Home health PT     Equipment Recommendations  None recommended by PT    Recommendations for Other Services       Precautions / Restrictions Precautions Precautions: Fall Restrictions Weight Bearing Restrictions: No    Mobility  Bed Mobility Overal bed mobility: Needs Assistance Bed Mobility: Supine to Sit;Sit to Supine     Supine to sit: Min assist Sit to supine: Min assist   General bed mobility comments: Min assist for LE and truncal management, scooting to EOB. Assist mostly needed to initiate movement  Transfers Overall transfer level: Needs assistance Equipment used: Rolling walker (2 wheeled) Transfers: Sit to/from Stand Sit to Stand: Min guard         General transfer comment: min guard for safety, verbal cuing  for hand placement when rising.  Ambulation/Gait Ambulation/Gait assistance: Min guard Gait Distance (Feet): 150 Feet Assistive device: Rolling walker (2 wheeled) Gait Pattern/deviations: Step-through pattern;Decreased stride length;Trunk flexed Gait velocity: decr   General Gait Details: min guard for safety, verbal cuing for upright posture, placement in RW, placement of RUE on RW in correct place (swollen, so pt reluctant to grip RW). Unable to locate room by room number when cued   Stairs             Wheelchair Mobility    Modified Rankin (Stroke Patients Only)       Balance Overall balance assessment: Needs assistance Sitting-balance support: Feet supported;No upper extremity supported Sitting balance-Leahy Scale: Fair     Standing balance support: During functional activity;Bilateral upper extremity supported Standing balance-Leahy Scale: Poor Standing balance comment: reliant on external support bilaterally, attempted SL support only and was unsuccessful                            Cognition Arousal/Alertness: Awake/alert Behavior During Therapy: Flat affect Overall Cognitive Status: No family/caregiver present to determine baseline cognitive functioning                     Current Attention Level: Selective   Following Commands: Follows one step commands consistently Safety/Judgement: Decreased awareness of safety;Decreased awareness of deficits Awareness: Emergent Problem Solving: Slow processing;Requires verbal cues;Requires tactile cues General Comments: Pt tearful due to RUE pain with mobility, able to state due to the procedure yesterday but states it is sorer since HD. Pt requiring cues for  safety with mobility during gait. Memory deficits, unable to state back room number after PT told pt 1 minute prior.      Exercises      General Comments General comments (skin integrity, edema, etc.): Swollen RUE s/p fistula procedure  yesterday, RN notified and addressing with MD      Pertinent Vitals/Pain Pain Assessment: Faces Faces Pain Scale: Hurts even more Pain Location: RUE, s/p fistula revision with significant edema in R hand Pain Descriptors / Indicators: Sore;Discomfort;Grimacing;Crying Pain Intervention(s): Limited activity within patient's tolerance;Monitored during session;Other (comment);Repositioned (RN unwrapped ace wrap to assess, RUE elevated on pillow)    Home Living                      Prior Function            PT Goals (current goals can now be found in the care plan section) Acute Rehab PT Goals Patient Stated Goal: to get up PT Goal Formulation: With patient Time For Goal Achievement: 10/30/20 Potential to Achieve Goals: Good Progress towards PT goals: Progressing toward goals    Frequency    Min 3X/week      PT Plan Current plan remains appropriate    Co-evaluation              AM-PAC PT "6 Clicks" Mobility   Outcome Measure  Help needed turning from your back to your side while in a flat bed without using bedrails?: A Little Help needed moving from lying on your back to sitting on the side of a flat bed without using bedrails?: A Little Help needed moving to and from a bed to a chair (including a wheelchair)?: A Little Help needed standing up from a chair using your arms (e.g., wheelchair or bedside chair)?: A Little Help needed to walk in hospital room?: A Little Help needed climbing 3-5 steps with a railing? : A Little 6 Click Score: 18    End of Session Equipment Utilized During Treatment: Gait belt Activity Tolerance: Patient limited by pain Patient left: in bed;with call bell/phone within reach;with bed alarm set Nurse Communication: Mobility status PT Visit Diagnosis: Unsteadiness on feet (R26.81)     Time: 9798-9211 PT Time Calculation (min) (ACUTE ONLY): 21 min  Charges:  $Gait Training: 8-22 mins                     Stacie Glaze, PT Acute  Rehabilitation Services Pager [phone]  Office [phone]    Louis Matte 10/16/2020, 4:36 PM

## 2020-10-16 NOTE — Discharge Summary (Addendum)
Discharge Summary  [name] WVP:[id number] DOB: 08-Jun-1961  PCP: Garwin Brothers, MD  Admit date: 10/06/2020 Discharge date: 10/16/2020  Time spent: 25 minutes  Recommendations for Outpatient Follow-up:  1. New medication: Lopressor 25 p.o. twice daily 2. Medication change: Sensipar increased from 30 to 120 mg p.o. daily 3. Wound care: daily dressing change with nonadherent gauze over incision line. 4. Patient's next dialysis session scheduled for Thursday 12/30 5. Patient will follow up with vascular surgery  Discharge Diagnoses:  Active Hospital Problems   Diagnosis Date Noted  . AMS (altered mental status) 10/06/2020  . Elevated troponin 10/06/2020  . Atrial fibrillation with rapid ventricular response ([place]) 03/20/2018  . HTN (hypertension) 11/08/2013  . ESRD (end stage renal disease) ([place]) 05/06/2013  . Mixed hyperlipidemia 09/19/2008    Resolved Hospital Problems  No resolved problems to display.    Discharge Condition: Improved, being discharged home  Diet recommendation: Renal diet  Vitals:   10/16/20 1035 10/16/20 1058  BP: (!) 128/57 121/62  Pulse: 77 81  Resp: 16 20  Temp: 98.5 F (36.9 C) 97.6 F (36.4 C)  SpO2: 98% 97%    History of present illness:  59 year old female past medical history significant for end-stage renal disease on hemodialysis, diabetes mellitus, chronic diastolic heart failure, CAD status post stent, CVA, A. 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  Code stroke activated but by time patient was seen by neurology, symptoms have resolved.  CT negative for acute findings.  No evidence of occlusion on CTA.  She was admitted at that time for altered mental status which is since resolved.   Hospital Course:  Principal Problem:   AMS (altered mental status)/acute encephalopathy: Possibly  related to fluid shifts and electrolytes during dialysis.  Seen by neurology.  CVA ruled out.    EEG noted no evidence of seizure activity.  No evidence of occlusion.  Much more alert now.  Continued on her Keppra Active Problems:   Mixed hyperlipidemia: Started on Crestor.  Underweight: Seen by nutrition.  As related to her chronic disease of end-stage renal disease as evidenced by her estimated needs.  Put on Nepro shake twice daily plus Rena-Vite daily plus Prosource twice daily.  BMI at 17.8.    ESRD (end stage renal disease) ([place]): Did not complete dialysis on 12/18, when she first came in because of her symptoms.  Nephrology following.  Currently on schedule, TTS.  Patient underwent revision of right AV fistula after it was noted to be ulcerated/with aneurysm.  Fistula should be ready for usage again and hemodialysis in approximately 6 months.  In the interim, patient also had insertion of tunneled dialysis catheter, placed on 12/27.    HTN (hypertension): Blood pressure elevated and continued on home meds.  Managing some of this with filtration.  Stable.    Atrial fibrillation with rapid ventricular response ([place]): Converted to sinus rhythm with Cardizem and amiodarone.    Started on metoprolol.  Heart rate now controlled.  Appreciate cardiology consult.  Non-ST elevated MI: Patient found to have elevated troponins. History of CAD with stents.  Started on heparin for 48 hours.  Stopped after 48 hours not a good candidate for long-term anticoagulation given intracranial hemorrhage.  Active candidate for cardiac cath given her intracranial hemorrhage as well as with poor compliance issues.  Consultants:  Vascular surgery  Nephrology  Cardiology  Neurology  Procedures:  Hemodialysis  Echocardiogram done 12/19: No evidence of valvular dysfunction.  Diastolic function indeterminate.  Preserved ejection fraction  Correction of right upper extremity fistula plus placement of  tunneled dialysis catheter done 12/27  EEG noted no evidence of seizure activity.  Discharge Exam: BP 121/62 (BP Location: Left Arm)   Pulse 81   Temp 97.6 F (36.4 C) (Oral)   Resp 20   Wt 42.7 kg   SpO2 97%   BMI 17.79 kg/m   General: Alert and oriented x2, no acute distress Cardiovascular: Regular rate and rhythm, S1-S2, 2 out of 6 systolic ejection murmur Respiratory: Clear to auscultation bilaterally  Discharge Instructions You were cared for by a hospitalist during your hospital stay. If you have any questions about your discharge medications or the care you received while you were in the hospital after you are discharged, you can call the unit and asked to speak with the hospitalist on call if the hospitalist that took care of you is not available. Once you are discharged, your primary care physician will handle any further medical issues. Please note that NO REFILLS for any discharge medications will be authorized once you are discharged, as it is imperative that you return to your primary care physician (or establish a relationship with a primary care physician if you do not have one) for your aftercare needs so that they can reassess your need for medications and monitor your lab values.  Discharge Instructions    Diet - low sodium heart healthy   Complete by: As directed    Discharge wound care:   Complete by: As directed    Keep dry.  Do not change unless dressing gets wet.  Further instructions after evaluation during dialysis sessions.   Increase activity slowly   Complete by: As directed      Allergies as of 10/16/2020      Reactions   Amoxicillin    Informed by pt's daughter [name]    Penicillins Hives, Rash   Has patient had a PCN reaction causing immediate rash, facial/tongue/throat swelling, SOB or lightheadedness with hypotension: Yes Has patient had a PCN reaction causing severe rash involving mucus membranes or skin necrosis: No Has patient had a  PCN reaction that required hospitalization: No Has patient had a PCN reaction occurring within the last 10 years: No If all of the above answers are "NO", then may proceed with Cephalosporin use.      Medication List    TAKE these medications   amiodarone 200 MG tablet Commonly known as: PACERONE Take 2 tabs (400mg ) daily for 7 days, then 1 tab (200mg ) daily thereafter   aspirin 81 MG EC tablet Take by mouth.   calcitRIOL 0.5 MCG capsule Commonly known as: ROCALTROL Take 1 capsule (0.5 mcg total) by mouth Every Tuesday,Thursday,and Saturday with dialysis.   calcium acetate 667 MG capsule Commonly known as: PHOSLO Take 667 mg by mouth 3 (three) times daily.   cinacalcet 30 MG tablet Commonly known as: SENSIPAR Take 4 tablets (120 mg total) by mouth Every Tuesday,Thursday,and Saturday with dialysis. Start taking on: October 18, 2020 What changed: how much to take   feeding supplement (NEPRO CARB STEADY) Liqd Take 237 mLs by mouth 2 (two) times daily between meals. Start taking on: October 17, 2020   (feeding supplement) PROSource Plus liquid Take 30 mLs by mouth 2 (two) times daily between meals. Start taking on: October 17, 2020   iron sucrose in sodium chloride 0.9 %  100 mL Iron Sucrose (Venofer)   lactulose 10 GM/15ML solution Commonly known as: CHRONULAC Take 10 g by mouth daily as needed for moderate constipation.   levETIRAcetam 500 MG tablet Commonly known as: KEPPRA Take 500 mg by mouth 2 (two) times daily.   metoprolol tartrate 25 MG tablet Commonly known as: LOPRESSOR Take 1 tablet (25 mg total) by mouth 2 (two) times daily.   multivitamin Tabs tablet Take 1 tablet by mouth daily.   nitroGLYCERIN 0.4 MG SL tablet Commonly known as: NITROSTAT Place 1 tablet (0.4 mg total) under the tongue every 5 (five) minutes as needed for chest pain.   omeprazole 20 MG capsule Commonly known as: PRILOSEC Take 20 mg by mouth daily.   rosuvastatin 10 MG  tablet Commonly known as: CRESTOR Take 1 tablet (10 mg total) by mouth daily. Start taking on: October 17, 2020   sucroferric oxyhydroxide 500 MG chewable tablet Commonly known as: Velphoro Chew 2 tablets (1,000 mg total) by mouth 3 (three) times daily with meals. What changed:   how much to take  when to take this  additional instructions            Discharge Care Instructions  (From admission, onward)         Start     Ordered   10/16/20 0000  Discharge wound care:       Comments: Keep dry.  Do not change unless dressing gets wet.  Further instructions after evaluation during dialysis sessions.   10/16/20 1403         Allergies  Allergen Reactions  . Amoxicillin     Informed by pt's daughter [name]   . Penicillins Hives and Rash    Has patient had a PCN reaction causing immediate rash, facial/tongue/throat swelling, SOB or lightheadedness with hypotension: Yes Has patient had a PCN reaction causing severe rash involving mucus membranes or skin necrosis: No Has patient had a PCN reaction that required hospitalization: No Has patient had a PCN reaction occurring within the last 10 years: No If all of the above answers are "NO", then may proceed with Cephalosporin use.     Follow-up Information    Almyra Deforest, [place] Follow up on 10/24/2020.   Specialties: Cardiology, Radiology Why: at 9:15am. Please arrive at 9:00 Contact information: [address] [place] [phone]        Cherre Robins, MD.   Specialties: Vascular Surgery, Interventional Cardiology Why: 2 weeks. The office will call the patient with an appointment (sent) Contact information: [place] [address] [phone]                The results of significant diagnostics from this hospitalization (including imaging, microbiology, ancillary and laboratory) are listed below for reference.    Significant Diagnostic Studies: CT Angio  Head W/Cm &/Or Wo Cm  Result Date: 10/06/2020 CLINICAL DATA:  Weakness.  Acute stroke presentation. EXAM: CT ANGIOGRAPHY HEAD AND NECK TECHNIQUE: Multidetector CT imaging of the head and neck was performed using the standard protocol during bolus administration of intravenous contrast. Multiplanar CT image reconstructions and MIPs were obtained to evaluate the vascular anatomy. Carotid stenosis measurements (when applicable) are obtained utilizing NASCET criteria, using the distal internal carotid diameter as the denominator. CONTRAST:  82mL OMNIPAQUE IOHEXOL 350 MG/ML SOLN COMPARISON:  Head CT same day. FINDINGS: CTA NECK FINDINGS Aortic arch: Aortic atherosclerosis. No flow limiting stenosis of the brachiocephalic vessel origins. Right carotid system: Common carotid  artery shows atherosclerotic calcification but is patent to the bifurcation. Calcified plaque at the carotid bifurcation and ICA bulb. Minimal diameter of the distal bulb is 4 mm. Compared to a more distal cervical ICA diameter of 4 mm, there is no stenosis. Left carotid system: Common carotid artery shows scattered atherosclerotic plaque but is patent to the bifurcation. Calcified plaque at the carotid bifurcation and ICA bulb. Minimal diameter in the ICA bulb measures 2 mm. Compared to a more distal cervical ICA diameter of 4 mm, this indicates a 50% stenosis. Vertebral arteries: Calcified plaque at both vertebral artery origins. Estimated 50% stenosis at both vertebral artery origins. Other areas of calcified plaque within both vertebral arteries present additional stenoses, more severe on the right than the left. Both vessels do remain patent through the cervical region to the foramen magnum. Skeleton: Ordinary cervical spondylosis. Other neck: Enlarged heterogeneous thyroid gland. Dominant nodule inferior left measuring at least 3 cm in diameter. Upper chest: No active disease. Review of the MIP images confirms the above findings CTA HEAD  FINDINGS Anterior circulation: Both internal carotid arteries are patent through the skull base and siphon regions. Extensive siphon atherosclerotic calcification with stenosis estimated at 50% on both sides. Aneurysm clip adjacent to the supraclinoid ICA on the right. No acute large or medium vessel occlusion identified. Second aneurysm clip at the right MCA bifurcation region. Posterior circulation: Both vertebral arteries are patent through the foramen magnum. 50% stenosis of the right vertebral artery V4 segment. Both vertebral arteries reach the basilar. Basilar artery shows some atherosclerotic narrowing and irregularity without a severe stenosis. 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 No sign of recurrent flow or coil compaction. Venous sinuses: Patent and normal. Anatomic variants: None other significant. Review of the MIP images confirms the above findings IMPRESSION: 1. No acute large or medium vessel occlusion. 2. Aortic atherosclerosis. 3. Atherosclerotic disease at both carotid bifurcations. No measurable stenosis on the right. 50% stenosis of the ICA bulb on the left. 4. 50% stenosis at both vertebral artery origins. Additional stenoses along the cervical course of both vertebral arteries. 50% stenosis of the right vertebral artery V4 segment. 5. 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 No sign of recurrent flow or coil compaction. 6. Aneurysm clips at the right MCA bifurcation and supraclinoid ICA region. No sign of residual aneurysmal flow. 7. 50% stenosis of both carotid siphon regions. 8. Enlarged heterogeneous thyroid gland with dominant nodule on the left measuring at least 3 cm in diameter. Recommend thyroid US (ref: J Am Coll Radiol. 2015 Feb;12(2): 143-50). Aortic Atherosclerosis (ICD10-I70.0). Electronically Signed   By: Nelson Chimes M.D.   On: 10/06/2020 22:03   DG Chest 2 View  Result Date: 10/06/2020 CLINICAL DATA:  Altered level of consciousness EXAM: CHEST - 2  VIEW COMPARISON:  02/02/2019 FINDINGS: Frontal and lateral views of the chest demonstrates stable ventriculostomy catheter within the right anterior chest. Cardiac silhouette is stable. No acute airspace disease, effusion, or pneumothorax. No acute bony abnormalities. IMPRESSION: 1. Stable chest, no acute process. Electronically Signed   By: Randa Ngo M.D.   On: 10/06/2020 23:30   CT Angio Neck W and/or Wo Contrast  Result Date: 10/06/2020 CLINICAL DATA:  Weakness.  Acute stroke presentation. EXAM: CT ANGIOGRAPHY HEAD AND NECK TECHNIQUE: Multidetector CT imaging of the head and neck was performed using the standard protocol during bolus administration of intravenous contrast. Multiplanar CT image reconstructions and MIPs were obtained to evaluate the vascular  anatomy. Carotid stenosis measurements (when applicable) are obtained utilizing NASCET criteria, using the distal internal carotid diameter as the denominator. CONTRAST:  106mL OMNIPAQUE IOHEXOL 350 MG/ML SOLN COMPARISON:  Head CT same day. FINDINGS: CTA NECK FINDINGS Aortic arch: Aortic atherosclerosis. No flow limiting stenosis of the brachiocephalic vessel origins. Right carotid system: Common carotid artery shows atherosclerotic calcification but is patent to the bifurcation. Calcified plaque at the carotid bifurcation and ICA bulb. Minimal diameter of the distal bulb is 4 mm. Compared to a more distal cervical ICA diameter of 4 mm, there is no stenosis. Left carotid system: Common carotid artery shows scattered atherosclerotic plaque but is patent to the bifurcation. Calcified plaque at the carotid bifurcation and ICA bulb. Minimal diameter in the ICA bulb measures 2 mm. Compared to a more distal cervical ICA diameter of 4 mm, this indicates a 50% stenosis. Vertebral arteries: Calcified plaque at both vertebral artery origins. Estimated 50% stenosis at both vertebral artery origins. Other areas of calcified plaque within both vertebral arteries  present additional stenoses, more severe on the right than the left. Both vessels do remain patent through the cervical region to the foramen magnum. Skeleton: Ordinary cervical spondylosis. Other neck: Enlarged heterogeneous thyroid gland. Dominant nodule inferior left measuring at least 3 cm in diameter. Upper chest: No active disease. Review of the MIP images confirms the above findings CTA HEAD FINDINGS Anterior circulation: Both internal carotid arteries are patent through the skull base and siphon regions. Extensive siphon atherosclerotic calcification with stenosis estimated at 50% on both sides. Aneurysm clip adjacent to the supraclinoid ICA on the right. No acute large or medium vessel occlusion identified. Second aneurysm clip at the right MCA bifurcation region. Posterior circulation: Both vertebral arteries are patent through the foramen magnum. 50% stenosis of the right vertebral artery V4 segment. Both vertebral arteries reach the basilar. Basilar artery shows some atherosclerotic narrowing and irregularity without a severe stenosis. 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 No sign of recurrent flow or coil compaction. Venous sinuses: Patent and normal. Anatomic variants: None other significant. Review of the MIP images confirms the above findings IMPRESSION: 1. No acute large or medium vessel occlusion. 2. Aortic atherosclerosis. 3. Atherosclerotic disease at both carotid bifurcations. No measurable stenosis on the right. 50% stenosis of the ICA bulb on the left. 4. 50% stenosis at both vertebral artery origins. Additional stenoses along the cervical course of both vertebral arteries. 50% stenosis of the right vertebral artery V4 segment. 5. 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 No sign of recurrent flow or coil compaction. 6. Aneurysm clips at the right MCA bifurcation and supraclinoid ICA region. No sign of residual aneurysmal flow. 7. 50% stenosis of both carotid siphon  regions. 8. Enlarged heterogeneous thyroid gland with dominant nodule on the left measuring at least 3 cm in diameter. Recommend thyroid US (ref: J Am Coll Radiol. 2015 Feb;12(2): 143-50). Aortic Atherosclerosis (ICD10-I70.0). Electronically Signed   By: Nelson Chimes M.D.   On: 10/06/2020 22:03   DG Chest Port 1 View  Result Date: 10/15/2020 CLINICAL DATA:  Post dialysis catheter placement EXAM: PORTABLE CHEST 1 VIEW COMPARISON:  10/06/2020 FINDINGS: New right IJ tunneled dialysis catheter tip overlies the cavoatrial junction. Partially imaged shunt catheter traversing the right chest. No new consolidation. No pleural effusion or pneumothorax. Stable cardiomediastinal contours. IMPRESSION: New right IJ tunneled dialysis catheter tip overlies the cavoatrial junction. No pneumothorax. Electronically Signed   By: Addison Lank.D.  On: 10/15/2020 13:22   EEG adult  Result Date: 10/07/2020 Lora Havens, MD     10/07/2020 11:06 AM Patient Name: Audianna Landgren MRN: [id number] Epilepsy Attending: Lora Havens Referring Physician/Provider: Dr Derrick Ravel Date: 10/07/2020 Duration: 28.56 mins Patient history: 59yo M with ams. EEG to evaluate for seizure Level of alertness: Awake,  asleep AEDs during EEG study: None Technical aspects: This EEG study was done with scalp electrodes positioned according to the 10-20 International system of electrode placement. Electrical activity was acquired at a sampling rate of 500Hz  and reviewed with a high frequency filter of 70Hz  and a low frequency filter of 1Hz . EEG data were recorded continuously and digitally stored. Description: The posterior dominant rhythm consists of 9 Hz activity of moderate voltage (25-35 uV) seen predominantly in posterior head regions, symmetric and reactive to eye opening and eye closing.  Sleep was characterized by vertex waves, sleep spindles (12 to 14 Hz), maximal frontocentral region. Hyperventilation and photic stimulation were  not performed.   IMPRESSION: This study is within normal limits. No seizures or epileptiform discharges were seen throughout the recording. Priyanka Barbra Sarks   DG Fluoro Guide CV Line-No Report  Result Date: 10/15/2020 Fluoroscopy was utilized by the requesting physician.  No radiographic interpretation.   ECHOCARDIOGRAM COMPLETE  Result Date: 10/07/2020    ECHOCARDIOGRAM REPORT   Patient Name:   ANDRIANNA MANALANG Date of Exam: 10/07/2020 Medical Rec #:  [id number]      Height:       61.0 in Accession #:    [id number]     Weight:       103.3 lb Date of Birth:  13-Apr-1961      BSA:          1.426 m Patient Age:    4 years       BP:           160/68 mmHg Patient Gender: F              HR:           68 bpm. Exam Location:  Inpatient Procedure: 2D Echo, 3D Echo, Cardiac Doppler and Color Doppler Indications:    TIA  History:        Patient has prior history of Echocardiogram examinations, most                 recent 03/23/2018. CHF, CAD, Abnormal ECG, Arrythmias:Atrial                 Fibrillation, Signs/Symptoms:Altered Mental Status, Chest Pain,                 Dyspnea and Syncope; Risk Factors:Hypertension, Dyslipidemia and                 Current Smoker. ESRD. Elevated troponin.  Sonographer:    Roseanna Rainbow RDCS Referring Phys: [id number] [place]  1. Left ventricular ejection fraction, by estimation, is 65 to 70%. The left ventricle has normal function. The left ventricle has no regional wall motion abnormalities. There is severe left ventricular hypertrophy. Left ventricular diastolic parameters  are indeterminate. Elevated left atrial pressure.  2. Right ventricular systolic function is normal. The right ventricular size is normal. There is normal pulmonary artery systolic pressure.  3. Left atrial size was mildly dilated.  4. The mitral valve is normal in structure. No evidence of mitral valve regurgitation. No evidence of mitral stenosis.  5. The aortic valve is tricuspid. There is  mild  calcification of the aortic valve. There is mild thickening of the aortic valve. Aortic valve regurgitation is not visualized. No aortic stenosis is present.  6. The inferior vena cava is normal in size with greater than 50% respiratory variability, suggesting right atrial pressure of 3 mmHg. FINDINGS  Left Ventricle: Left ventricular ejection fraction, by estimation, is 65 to 70%. The left ventricle has normal function. The left ventricle has no regional wall motion abnormalities. The left ventricular internal cavity size was normal in size. There is  severe left ventricular hypertrophy. Left ventricular diastolic parameters are indeterminate. Elevated left atrial pressure. Right Ventricle: The right ventricular size is normal. No increase in right ventricular wall thickness. Right ventricular systolic function is normal. There is normal pulmonary artery systolic pressure. The tricuspid regurgitant velocity is 2.08 m/s, and  with an assumed right atrial pressure of 3 mmHg, the estimated right ventricular systolic pressure is 40.9 mmHg. Left Atrium: Left atrial size was mildly dilated. Right Atrium: Right atrial size was normal in size. Pericardium: There is no evidence of pericardial effusion. Mitral Valve: The mitral valve is normal in structure. No evidence of mitral valve regurgitation. No evidence of mitral valve stenosis. Tricuspid Valve: The tricuspid valve is normal in structure. Tricuspid valve regurgitation is mild . No evidence of tricuspid stenosis. Aortic Valve: The aortic valve is tricuspid. There is mild calcification of the aortic valve. There is mild thickening of the aortic valve. There is mild aortic valve annular calcification. Aortic valve regurgitation is not visualized. No aortic stenosis  is present. Aortic valve mean gradient measures 3.4 mmHg. Aortic valve peak gradient measures 7.7 mmHg. Aortic valve area, by VTI measures 2.56 cm. Pulmonic Valve: The pulmonic valve was not well  visualized. Pulmonic valve regurgitation is not visualized. No evidence of pulmonic stenosis. Aorta: The aortic root is normal in size and structure. Pulmonary Artery: No significant pulmonary HTN, PASP is 20 mmHg. Venous: The inferior vena cava is normal in size with greater than 50% respiratory variability, suggesting right atrial pressure of 3 mmHg. IAS/Shunts: No atrial level shunt detected by color flow Doppler.  LEFT VENTRICLE PLAX 2D LVIDd:         3.40 cm     Diastology LVIDs:         2.20 cm     LV e' medial:    4.35 cm/s LV PW:         1.80 cm     LV E/e' medial:  22.0 LV IVS:        1.90 cm     LV e' lateral:   5.22 cm/s LVOT diam:     1.90 cm     LV E/e' lateral: 18.3 LV SV:         73 LV SV Index:   51 LVOT Area:     2.84 cm  LV Volumes (MOD) LV vol d, MOD A2C: 68.2 ml LV vol d, MOD A4C: 82.9 ml LV vol s, MOD A2C: 15.1 ml LV vol s, MOD A4C: 29.4 ml LV SV MOD A2C:     53.1 ml LV SV MOD A4C:     82.9 ml LV SV MOD BP:      53.4 ml RIGHT VENTRICLE             IVC RV S prime:     10.80 cm/s  IVC diam: 1.20 cm TAPSE (M-mode): 2.1 cm LEFT ATRIUM           Index  RIGHT ATRIUM           Index LA diam:      3.40 cm 2.38 cm/m  RA Area:     13.50 cm LA Vol (A2C): 42.5 ml 29.80 ml/m RA Volume:   33.20 ml  23.28 ml/m LA Vol (A4C): 21.1 ml 14.79 ml/m  AORTIC VALVE AV Area (Vmax):    2.40 cm AV Area (Vmean):   2.39 cm AV Area (VTI):     2.56 cm AV Vmax:           138.50 cm/s AV Vmean:          86.255 cm/s AV VTI:            0.285 m AV Peak Grad:      7.7 mmHg AV Mean Grad:      3.4 mmHg LVOT Vmax:         117.00 cm/s LVOT Vmean:        72.700 cm/s LVOT VTI:          0.258 m LVOT/AV VTI ratio: 0.90  AORTA Ao Root diam: 3.00 cm Ao Asc diam:  3.00 cm MITRAL VALVE               TRICUSPID VALVE MV Area (PHT): 4.31 cm    TR Peak grad:   17.3 mmHg MV Decel Time: 176 msec    TR Vmax:        208.00 cm/s MV E velocity: 95.70 cm/s MV A velocity: 93.00 cm/s  SHUNTS MV E/A ratio:  1.03        Systemic VTI:  0.26 m                             Systemic Diam: 1.90 cm Carlyle Dolly MD Electronically signed by Carlyle Dolly MD Signature Date/Time: 10/07/2020/11:53:40 AM    Final    US THYROID  Result Date: 10/08/2020 CLINICAL DATA:  Palpable abnormality. Palpable left-sided thyroid nodule on physical examination. EXAM: THYROID ULTRASOUND TECHNIQUE: Ultrasound examination of the thyroid gland and adjacent soft tissues was performed. COMPARISON:  None. FINDINGS: Parenchymal Echotexture: Mildly heterogenous Isthmus: Normal in size measures 0.2 cm in diameter Right lobe: Normal in size measuring 4.1 x 1.4 x 1.6 cm Left lobe: Normal in size measuring 4.9 x 2.1 x 2.9 cm _________________________________________________________ Estimated total number of nodules >/= 1 cm: 2 Number of spongiform nodules >/=  2 cm not described below (TR1): 0 Number of mixed cystic and solid nodules >/= 1.5 cm not described below (TR2): 0 _________________________________________________________ There is an approximately 1.2 x 0.9 x 0.8 cm partially solid though prominently cystic nodule within the superior pole the right lobe of the thyroid labeled 1), which does not meet criteria to recommend percutaneous sampling or continued dedicated follow-up. There is a punctate (approximately 0.8 cm) isoechoic nodule within the mid aspect the right lobe of the thyroid (labeled 2), which does not meet criteria to recommend percutaneous sampling or continued dedicated follow-up There is a punctate (approximately 0.4 cm) anechoic cyst within the inferior pole the right lobe of the thyroid (labeled 3), which does not meet criteria to recommend percutaneous sampling or continued dedicated follow-up. There is a punctate (approximately 0.6 cm) isoechoic ill-defined nodule/pseudonodule within mid aspect the right lobe of the thyroid labeled 4), which does not meet criteria to recommend percutaneous sampling or continued dedicated follow-up  _________________________________________________________ Nodule # 5: Location: Left; Mid - this nodule likely  correlates with the patient's palpable area of concern Maximum size: 3.7 cm; Other 2 dimensions: 2.6 x 1.8 cm Composition: solid/almost completely solid (2) Echogenicity: hypoechoic (2) Shape: not taller-than-wide (0) Margins: smooth (0) Echogenic foci: none (0) ACR TI-RADS total points: 4. ACR TI-RADS risk category: TR4 (4-6 points). ACR TI-RADS recommendations: **Given size (>/= 1.5 cm) and appearance, fine needle aspiration of this moderately suspicious nodule should be considered based on TI-RADS criteria. _________________________________________________________ There is a punctate (approximately 0.8 cm spongiform/benign-appearing nodule within mid aspect the left lobe of the thyroid (labeled 6), which does not meet criteria to recommend percutaneous sampling or continued dedicated follow-up. _________________________________________________________ Nodule # 7: Location: Left; Superior Maximum size: 0.8 cm; Other 2 dimensions: 0.8 x 0.4 cm Composition: solid/almost completely solid (2) Echogenicity: isoechoic (1) Shape: not taller-than-wide (0) Margins: ill-defined (0) Echogenic foci: macrocalcifications (1) ACR TI-RADS total points: 4. ACR TI-RADS risk category: TR4 (4-6 points). ACR TI-RADS recommendations: Given size (<0.9 cm) and appearance, this nodule does NOT meet TI-RADS criteria for biopsy or dedicated follow-up. _________________________________________________________ There is a punctate (approximately 0.9 cm) anechoic cyst within the left side of the thyroid isthmus (labeled 8), which contains an internal echogenic foci ring down artifact compatible with benign colloid. This benign colloid containing cyst does not meet criteria to recommend percutaneous sampling or continued dedicated follow-up. IMPRESSION: 1. Findings suggestive of multinodular goiter. 2. Nodule #5, likely correlating with  the patient's palpable area of concern, meets imaging criteria to recommend percutaneous sampling as indicated. 3. None of the additional discretely measured nodules/cysts, meet imaging criteria to recommend percutaneous sampling or continued dedicated follow-up. The above is in keeping with the ACR TI-RADS recommendations - J Am Coll Radiol 2017;14:587-595. Electronically Signed   By: Sandi Mariscal M.D.   On: 10/08/2020 07:59   CT HEAD CODE STROKE WO CONTRAST  Result Date: 10/06/2020 CLINICAL DATA:  Code stroke.  Weakness EXAM: CT HEAD WITHOUT CONTRAST TECHNIQUE: Contiguous axial images were obtained from the base of the skull through the vertex without intravenous contrast. COMPARISON:  02/16/2018 FINDINGS: Brain: Old small vessel cerebellar infarctions as seen previously. Cerebral hemispheres show old infarction in the left parietal lobe which is unchanged. Extensive chronic small-vessel ischemic changes of the white matter as seen previously. Old infarction in the left basal ganglia. Right VP shunt placed from a frontal approach enters the right lateral ventricle, extends through the foramen of Monro into the third ventricle and has its tip in the region of the left midbrain. 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 Previous aneurysm clip at the base of the brain on the right. No sign of acute infarction, hemorrhage, hydrocephalus or extra-axial collection. Vascular: There is atherosclerotic calcification of the major vessels at the base of the brain. Skull: Craniotomy on the right in the distant past. No acute finding. Sinuses/Orbits: Clear/normal Other: None IMPRESSION: 1. No acute finding by CT. Extensive chronic small-vessel ischemic changes throughout the brain as seen previously. 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 2. Right VP shunt in place. No hydrocephalus. 3. These results were communicated to [facility] at 5:43 pmon 12/18/2021by text page via the [facility] messaging system.  Electronically Signed   By: Nelson Chimes M.D.   On: 10/06/2020 17:44    Microbiology: Recent Results (from the past 240 hour(s))  Resp Panel by RT-PCR (Flu A&B, Covid) Nasopharyngeal Swab     Status: None   Collection Time: 10/06/20  5:25 PM   Specimen: Nasopharyngeal Swab; Nasopharyngeal(NP) swabs in vial transport  medium  Result Value Ref Range Status   SARS Coronavirus 2 by RT PCR NEGATIVE NEGATIVE Final    Comment: (NOTE) SARS-CoV-2 target nucleic acids are NOT DETECTED.  The SARS-CoV-2 RNA is generally detectable in upper respiratory specimens during the acute phase of infection. The lowest concentration of SARS-CoV-2 viral copies this assay can detect is 138 copies/mL. A negative result does not preclude SARS-Cov-2 infection and should not be used as the sole basis for treatment or other patient management decisions. A negative result may occur with  improper specimen collection/handling, submission of specimen other than nasopharyngeal swab, presence of viral mutation(s) within the areas targeted by this assay, and inadequate number of viral copies(<138 copies/mL). A negative result must be combined with clinical observations, patient history, and epidemiological information. The expected result is Negative.  Fact Sheet for Patients:  EntrepreneurPulse.com.au  Fact Sheet for Healthcare Providers:  IncredibleEmployment.be  This test is no t yet approved or cleared by the [place] FDA and  has been authorized for detection and/or diagnosis of SARS-CoV-2 by FDA under an Emergency Use Authorization (EUA). This EUA will remain  in effect (meaning this test can be used) for the duration of the COVID-19 declaration under Section 564(b)(1) of the Act, 21 U.S.C.section 360bbb-3(b)(1), unless the authorization is terminated  or revoked sooner.       Influenza A by PCR NEGATIVE NEGATIVE Final   Influenza B by PCR NEGATIVE NEGATIVE Final     Comment: (NOTE) The Xpert Xpress SARS-CoV-2/FLU/RSV plus assay is intended as an aid in the diagnosis of influenza from Nasopharyngeal swab specimens and should not be used as a sole basis for treatment. Nasal washings and aspirates are unacceptable for Xpert Xpress SARS-CoV-2/FLU/RSV testing.  Fact Sheet for Patients: EntrepreneurPulse.com.au  Fact Sheet for Healthcare Providers: IncredibleEmployment.be  This test is not yet approved or cleared by the [place] FDA and has been authorized for detection and/or diagnosis of SARS-CoV-2 by FDA under an Emergency Use Authorization (EUA). This EUA will remain in effect (meaning this test can be used) for the duration of the COVID-19 declaration under Section 564(b)(1) of the Act, 21 U.S.C. section 360bbb-3(b)(1), unless the authorization is terminated or revoked.  Performed at [facility], [place] [address], [place], [place] [address]   MRSA PCR Screening     Status: None   Collection Time: 10/10/20  8:35 PM   Specimen: Nasal Mucosa; Nasopharyngeal  Result Value Ref Range Status   MRSA by PCR NEGATIVE NEGATIVE Final    Comment:        The GeneXpert MRSA Assay (FDA approved for NASAL specimens only), is one component of a comprehensive MRSA colonization surveillance program. It is not intended to diagnose MRSA infection nor to guide or monitor treatment for MRSA infections. Performed at [facility], [place] [address], [place], [place] [address]   Surgical PCR screen     Status: None   Collection Time: 10/14/20  7:52 PM   Specimen: Nasal Mucosa; Nasal Swab  Result Value Ref Range Status   MRSA, PCR NEGATIVE NEGATIVE Final   Staphylococcus aureus NEGATIVE NEGATIVE Final    Comment: (NOTE) The Xpert SA Assay (FDA approved for NASAL specimens in patients 5 years of age and older), is one component of a comprehensive surveillance program. It is not intended to diagnose  infection nor to guide or monitor treatment. Performed at [facility], [place] [address], [place], [place] [address]      Labs:  Basic Metabolic Panel: Recent Labs  Lab 10/10/20 0301 10/12/20 1127 10/14/20 1400 10/15/20 1004 10/16/20 0438  NA 132* 133* 134* 136 134*  K 4.5 5.2* 6.0* 4.9 5.9*  CL 91* 92* 94* 101 96*  CO2 24 22 25   --  24  GLUCOSE 107* 129* 95 78 104*  BUN 37* 100* 80* 37* 52*  CREATININE 6.37* 12.32* 9.53* 6.00* 7.43*  CALCIUM 9.3 9.2 9.6  --  9.3  MG 2.2  --   --   --   --   PHOS 7.8* 9.0* 6.8*  --   --    Liver Function Tests: Recent Labs  Lab 10/10/20 0301 10/12/20 1127 10/14/20 1400  AST 21  --   --   ALT 13  --   --   ALKPHOS 55  --   --   BILITOT 0.6  --   --   PROT 6.4*  --   --   ALBUMIN 3.3* 2.9* 3.0*   No results for input(s): LIPASE, AMYLASE in the last 168 hours. No results for input(s): AMMONIA in the last 168 hours. CBC: Recent Labs  Lab 10/10/20 0301 10/12/20 1127 10/14/20 1400 10/15/20 1004 10/16/20 0438 10/16/20 1301  WBC 4.8 4.3 5.2  --  8.3 8.2  HGB 12.2 11.3* 11.3* 11.9* 9.3* 10.9*  HCT 37.0 32.8* 35.0* 35.0* 29.1* 32.0*  MCV 87.7 88.4 91.9  --  91.5 89.9  PLT 185 187 212  --  194 220   Cardiac Enzymes: No results for input(s): CKTOTAL, CKMB, CKMBINDEX, TROPONINI in the last 168 hours. BNP: BNP (last 3 results) No results for input(s): BNP in the last 8760 hours.  ProBNP (last 3 results) No results for input(s): PROBNP in the last 8760 hours.  CBG: Recent Labs  Lab 10/15/20 0740 10/15/20 1950 10/16/20 0014 10/16/20 0430 10/16/20 1101  GLUCAP 75 164* 112* 103* 89       Signed:  Annita Brod, MD [facility] 10/16/2020, 2:03 PM

## 2020-10-16 NOTE — Progress Notes (Signed)
CM has attempted to reach both daughters. Voicemails left for both. TOC following.

## 2020-10-16 NOTE — Progress Notes (Deleted)
Patient ID: [name], female   DOB: Mar 24, 1961, 59 y.o.   MRN: [id number]  [facility] Progress Note   Assessment/ Plan:   1.  Altered mental status: Unclear etiology with mental status gradually improving and appears to be now towards her baseline.  Work-up negative to date.  She has underlying dementia and stays at home with her daughter. 2. ESRD: cont HD TTS. Underwent plication of L arm AVF 12/27 per VVS, also TDC placement. HD was today, this am. Pt ready for dc, will notify primary team.  3. Anemia: Without overt blood loss and hemoglobin and hematocrit currently at goal off of ESA. 4. CKD-MBD: Significant hyperphosphatemia-continue renal diet with Velphoro today for phosphorus binding. On cinacalcet for PTH control. 5. Nutrition: Tolerating regular diet with fluid restriction.  We will switch this to renal diet to limit potassium/phosphorus. 6. Hypertension: Blood pressure elevated at this time-monitor with hemodialysis.  Kelly Splinter, MD 10/16/2020, 1:34 PM   Subjective:   No c/o's.    Objective:   BP 121/62 (BP Location: Left Arm)   Pulse 81   Temp 97.6 F (36.4 C) (Oral)   Resp 20   Wt 42.7 kg   SpO2 97%   BMI 17.79 kg/m   Physical Exam: Gen: Appears comfortable  CVS: Pulse regular rhythm, normal rate, S1 and S2 normal without murmur/gallop Resp: Poor inspiratory effort with decreased breath sounds over bases, no distinct rales or rhonchi Abd: Soft, flat, mildly tender over lower quadrants, bowel sounds normal Ext: No lower extremity edema.  R arm wrapped Neuro: Ox 2.5  OP HD: [facility]  400/ 800   37kg   2/2 bath   Hep none  IJ TDC placed here (RUA AVF sp plication on 37/16/96) Sensipar 120mg  qHD Calcitriol 5mcg qHD  Labs: BMET Recent Labs  Lab 10/10/20 0301 10/12/20 1127 10/14/20 1400 10/15/20 1004 10/16/20 0438  NA 132* 133* 134* 136 134*  K 4.5 5.2* 6.0* 4.9 5.9*  CL 91* 92* 94* 101 96*  CO2 24 22 25   --  24  GLUCOSE 107* 129*  95 78 104*  BUN 37* 100* 80* 37* 52*  CREATININE 6.37* 12.32* 9.53* 6.00* 7.43*  CALCIUM 9.3 9.2 9.6  --  9.3  PHOS 7.8* 9.0* 6.8*  --   --    CBC Recent Labs  Lab 10/12/20 1127 10/14/20 1400 10/15/20 1004 10/16/20 0438 10/16/20 1301  WBC 4.3 5.2  --  8.3 8.2  HGB 11.3* 11.3* 11.9* 9.3* 10.9*  HCT 32.8* 35.0* 35.0* 29.1* 32.0*  MCV 88.4 91.9  --  91.5 89.9  PLT 187 212  --  194 220     Medications:    .  stroke: mapping our early stages of recovery book   Does not apply Once  . (feeding supplement) PROSource Plus  30 mL Oral BID BM  . amiodarone  200 mg Oral Daily  . aspirin EC  81 mg Oral Daily  . calcitRIOL  0.5 mcg Oral Q T,Th,Sa-HD  . Chlorhexidine Gluconate Cloth  6 each Topical Q0600  . cinacalcet  120 mg Oral Q T,Th,Sa-HD  . feeding supplement (NEPRO CARB STEADY)  237 mL Oral BID BM  . heparin sodium (porcine)  3,200 Units Intravenous Q T,Th,Sa-HD  . melatonin  5 mg Oral QHS  . metoprolol tartrate  25 mg Oral BID  . multivitamin  1 tablet Oral QHS  . mupirocin ointment  1 application Nasal BID  . rosuvastatin  10 mg Oral  Daily  . sucroferric oxyhydroxide  1,000 mg Oral TID WC

## 2020-10-16 NOTE — Progress Notes (Signed)
  Progress Note    10/16/2020 8:01 AM 1 Day Post-Op  Subjective:  R arm sore.  Tolerating HD via R IJ TDC currently   Vitals:   10/16/20 0013 10/16/20 0428  BP: (!) 174/72 (!) 156/70  Pulse: 71 71  Resp: 17 18  Temp: 97.6 F (36.4 C) 98.3 F (36.8 C)  SpO2: 100% 100%   Physical Exam: Lungs:  Non labored Incisions:  ACE wrap left in place R arm Extremities:  Palpable R radial pulse; TDC site without hematoma or bleeding Neurologic: A&O  CBC    Component Value Date/Time   WBC 8.3 10/16/2020 0438   RBC 3.18 (L) 10/16/2020 0438   HGB 9.3 (L) 10/16/2020 0438   HCT 29.1 (L) 10/16/2020 0438   PLT 194 10/16/2020 0438   MCV 91.5 10/16/2020 0438   MCH 29.2 10/16/2020 0438   MCHC 32.0 10/16/2020 0438   RDW 14.8 10/16/2020 0438   LYMPHSABS 1.6 10/06/2020 1728   MONOABS 0.3 10/06/2020 1728   EOSABS 0.0 10/06/2020 1728   BASOSABS 0.0 10/06/2020 1728    BMET    Component Value Date/Time   NA 134 (L) 10/16/2020 0438   K 5.9 (H) 10/16/2020 0438   CL 96 (L) 10/16/2020 0438   CO2 24 10/16/2020 0438   GLUCOSE 104 (H) 10/16/2020 0438   BUN 52 (H) 10/16/2020 0438   CREATININE 7.43 (H) 10/16/2020 0438   CALCIUM 9.3 10/16/2020 0438   GFRNONAA 6 (L) 10/16/2020 0438   GFRAA 5 (L) 02/03/2019 0652    INR    Component Value Date/Time   INR 1.0 10/06/2020 1728     Intake/Output Summary (Last 24 hours) at 10/16/2020 0801 Last data filed at 10/15/2020 1231 Gross per 24 hour  Intake 350 ml  Output 100 ml  Net 250 ml     Assessment/Plan:  59 y.o. female is s/p revision of R arm fistula and placement of R IJ TDC 1 Day Post-Op   R IJ TDC functioning well R arm well perfused with palpable radial pulse; continue ACE wrap today Do not cannulate R arm AVF for 6 weeks   Dagoberto Ligas, PA-C [facility] [phone] 10/16/2020 8:01 AM

## 2020-10-16 NOTE — Progress Notes (Addendum)
[name] Progress Note   Subjective:  Seen on HD - 1.5L UF goal and tolerating, TDC running well. Denies CP or dyspnea. R arm remains wrapped with ACE bandage.  Objective Vitals:   10/16/20 0724 10/16/20 0735 10/16/20 0750 10/16/20 0805  BP: (!) 162/68 (!) 170/68 (!) 141/64 (!) 147/65  Pulse:      Resp:      Temp:      TempSrc:      SpO2:      Weight:       Physical Exam General: Chronically ill appearing woman, NAD Heart: RRR; no murmur Lungs: CTA anteriorly Abdomen: soft, non-tender Extremities: No LE edema Dialysis Access:  R TDC in use, RUE wrapped/bandaged  Additional Objective Labs: Basic Metabolic Panel: Recent Labs  Lab 10/10/20 0301 10/12/20 1127 10/14/20 1400 10/15/20 1004 10/16/20 0438  NA 132* 133* 134* 136 134*  K 4.5 5.2* 6.0* 4.9 5.9*  CL 91* 92* 94* 101 96*  CO2 24 22 25   --  24  GLUCOSE 107* 129* 95 78 104*  BUN 37* 100* 80* 37* 52*  CREATININE 6.37* 12.32* 9.53* 6.00* 7.43*  CALCIUM 9.3 9.2 9.6  --  9.3  PHOS 7.8* 9.0* 6.8*  --   --    Liver Function Tests: Recent Labs  Lab 10/10/20 0301 10/12/20 1127 10/14/20 1400  AST 21  --   --   ALT 13  --   --   ALKPHOS 55  --   --   BILITOT 0.6  --   --   PROT 6.4*  --   --   ALBUMIN 3.3* 2.9* 3.0*   CBC: Recent Labs  Lab 10/10/20 0301 10/12/20 1127 10/14/20 1400 10/15/20 1004 10/16/20 0438  WBC 4.8 4.3 5.2  --  8.3  HGB 12.2 11.3* 11.3* 11.9* 9.3*  HCT 37.0 32.8* 35.0* 35.0* 29.1*  MCV 87.7 88.4 91.9  --  91.5  PLT 185 187 212  --  194   Studies/Results: DG Chest Port 1 View  Result Date: 10/15/2020 CLINICAL DATA:  Post dialysis catheter placement EXAM: PORTABLE CHEST 1 VIEW COMPARISON:  10/06/2020 FINDINGS: New right IJ tunneled dialysis catheter tip overlies the cavoatrial junction. Partially imaged shunt catheter traversing the right chest. No new consolidation. No pleural effusion or pneumothorax. Stable cardiomediastinal contours. IMPRESSION: New right IJ  tunneled dialysis catheter tip overlies the cavoatrial junction. No pneumothorax. Electronically Signed   By: Macy Mis M.D.   On: 10/15/2020 13:22   DG Fluoro Guide CV Line-No Report  Result Date: 10/15/2020 Fluoroscopy was utilized by the requesting physician.  No radiographic interpretation.   Medications:  sodium chloride     sodium chloride     sodium chloride Stopped (10/11/20 1247)     stroke: mapping our early stages of recovery book   Does not apply Once   (feeding supplement) PROSource Plus  30 mL Oral BID BM   amiodarone  200 mg Oral Daily   aspirin EC  81 mg Oral Daily   calcitRIOL  0.5 mcg Oral Q T,Th,Sa-HD   Chlorhexidine Gluconate Cloth  6 each Topical Q0600   cinacalcet  60 mg Oral Q T,Th,Sa-HD   feeding supplement (NEPRO CARB STEADY)  237 mL Oral BID BM   melatonin  5 mg Oral QHS   metoprolol tartrate  25 mg Oral BID   multivitamin  1 tablet Oral QHS   mupirocin ointment  1 application Nasal BID   rosuvastatin  10 mg Oral Daily  sucroferric oxyhydroxide  1,000 mg Oral TID WC    Dialysis Orders: TTS -[facility] 4hrs, BFR400, IEP329, EDW 37kg,2K/2Ca RUE AVF no heparin - Sensipar 120mg  qHD - Calcitriol 69mcg qHD  Assessment/Plan: 1. AMS: On admit, negative work-up including CT head/neck and EEG. Improved, to baseline. 2. ESRD: Continue HD per usual TTS sched - HD today using TDC s/p AVF plication on 51/88. K high - using 1K bath for 1hr, then 2K.  OK for dc from renal standpoint.  3. HTN/volume: BP high side, no edema - 1.5L UFG today. 4. Anemia: Hgb 9.3 post-op. Usually with high Hgb, follow this week - may need ESA dose if drops lower. 5. Secondary hyperparathyroidism: CorrCa a little high, Phos high - continue binders/VDRA, ^ sensipar to usual dose. 6. Nutrition:   7. NSTEMI/demand ischemia: Known CAD. TTE with preserved EF. Med management. 8. Hx seizure disorder 9. Hx ICH x 2 with VP shunt 10. A-fib with RVR: Resolved, on amio +  metoprolol. 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 Per [facility].   Diane Penton, PA-C 10/16/2020, 8:32 AM  [name]  Pt seen, examined and agree w assess/plan as above with additions as indicated.  [facility] 10/16/2020, 2:39 PM

## 2020-10-17 LAB — GLUCOSE, CAPILLARY
Glucose-Capillary: 90 mg/dL (ref 70–99)
Glucose-Capillary: 109 mg/dL — ABNORMAL HIGH (ref 70–99)
Glucose-Capillary: 98 mg/dL (ref 70–99)

## 2020-10-17 NOTE — Progress Notes (Signed)
Discharge instructions reviewed with daughter [name] over the telephone. Daughter has a 58 year old and not able to come into the hospital with her, therefore instructions reviewed with daughter over the phone. (pt has dementia and not able to remember the instructions).  Daughter will call when she arrives to the hospital and staff will take pt down to car and nurse will discuss any instructions further with print out if needed.  Discussed incision care of fistula and dialysis catheter in chest. Handouts of new meds provided. Copy of instructions being sent with pt, for daughter. Daughter informed of scripts sent to pt's pharmacy.   Await daughter to call that she is here.

## 2020-10-17 NOTE — Progress Notes (Signed)
Occupational Therapy Treatment Patient Details Name: [name] MRN: [id number] DOB: Dec 31, 1960 Today's Date: 10/17/2020    History of present illness This 59 y.o. female with PMH significant for ESRD on HD, T2DM, HLD, CAD with stents, chronic diastolic CHF, A. 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  Patient was brought to the ED on 12/18 from dialysis center for evaluation of decreased responsiveness.  Pt admitted with acute encephalopathy, afib with RVR, and elevated troponin/NSTEMI with cardio consulted. s/p revision R AV fistula, insertion of tunneled dialysis catheter on 12/27.   OT comments  Patient met lying in bed attempting to each breakfast but unsuccessful due to poor positioning. Patient in agreement with OT treatment session with focus on self-care re-education, ADL transfers and cognitive reorientation. Patient initially only oriented to name but not D.O.B. OT provided reorientation. Patient demonstrates Min guard for bed mobility and sit to stand transfers grossly with hand held assist for short-distance mobility requiring increased time. Patient with swelling to RUE 2/2 AV fistula revision. Education on elevation to reduce swelling. Patient hesitant to use RUE during self-feeding tasks requiring coaxing/encouragement. Patient would benefit from continued acute OT services in prep for safe d/c home with family with updated recommendation for [place]. Frequency updated to 2x weekly given mild decline in function for extensive hospital stay.     Follow Up Recommendations  Home health OT;Supervision/Assistance - 24 hour    Equipment Recommendations  None recommended by OT    Recommendations for Other Services      Precautions / Restrictions Precautions Precautions: Fall Precaution Comments: High Fall Risk Restrictions Weight Bearing Restrictions: No       Mobility Bed Mobility Overal bed  mobility: Needs Assistance Bed Mobility: Supine to Sit     Supine to sit: Min guard     General bed mobility comments: Patient able to advance BLE toward EOB and elevate trunk +rail with Min guard for safety.  Transfers Overall transfer level: Needs assistance Equipment used: None Transfers: Sit to/from Stand Sit to Stand: Min guard         General transfer comment: Verbal cues for hand placement, hand-held assist and Min guard for safety.    Balance Overall balance assessment: Needs assistance Sitting-balance support: Feet supported;No upper extremity supported Sitting balance-Leahy Scale: Fair Sitting balance - Comments: Able to maintain sitting balance at EOB without assist.   Standing balance support: During functional activity;Bilateral upper extremity supported Standing balance-Leahy Scale: Poor Standing balance comment: Patient able to take 4-5 steps with unilateral UE support via hand held assist. Increased time required.                           ADL either performed or assessed with clinical judgement   ADL   Eating/Feeding: Set up Eating/Feeding Details (indicate cue type and reason): Patient able to use spoon to scoop items on plate and bring to mouth. Assist to open containers 2/2 pain in RUE. Patient encouraged to use R hand within pain tolerance.                                         Vision       Perception     Praxis      Cognition Arousal/Alertness: Awake/alert Behavior During Therapy: Flat affect Overall Cognitive Status: History of  cognitive impairments - at baseline Area of Impairment: Orientation;Attention;Memory;Following commands;Safety/judgement;Awareness;Problem solving                 Orientation Level: Place;Time;Situation Current Attention Level: Selective Memory: Decreased short-term memory Following Commands: Follows one step commands consistently Safety/Judgement: Decreased awareness of  safety;Decreased awareness of deficits Awareness: Emergent Problem Solving: Slow processing;Requires verbal cues;Requires tactile cues General Comments: Patient oriented to name only. Not oriented to date. Able to follow 1-step commands with good accuracy and able to make needs known this date.        Exercises     Shoulder Instructions       General Comments      Pertinent Vitals/ Pain       Pain Assessment: Faces Faces Pain Scale: Hurts even more Pain Location: RUE, s/p fistula revision with significant edema in R hand Pain Descriptors / Indicators: Sore;Discomfort;Grimacing;Crying Pain Intervention(s): Limited activity within patient's tolerance;Monitored during session;Repositioned  Home Living                                          Prior Functioning/Environment              Frequency  Min 2X/week        Progress Toward Goals  OT Goals(current goals can now be found in the care plan section)  Progress towards OT goals: Progressing toward goals  Acute Rehab OT Goals Patient Stated Goal: To return home. OT Goal Formulation: Patient unable to participate in goal setting Time For Goal Achievement: 10/22/20 Potential to Achieve Goals: Good ADL Goals Pt Will Perform Lower Body Dressing: with modified independence Pt Will Transfer to Toilet: with modified independence  Plan Discharge plan needs to be updated;Frequency needs to be updated    Co-evaluation                 AM-PAC OT "6 Clicks" Daily Activity     Outcome Measure   Help from another person eating meals?: A Little Help from another person taking care of personal grooming?: A Little Help from another person toileting, which includes using toliet, bedpan, or urinal?: A Little Help from another person bathing (including washing, rinsing, drying)?: A Little Help from another person to put on and taking off regular upper body clothing?: A Little Help from another person to  put on and taking off regular lower body clothing?: A Little 6 Click Score: 18    End of Session    OT Visit Diagnosis: Unsteadiness on feet (R26.81);Pain Pain - Right/Left: Right Pain - part of body: Arm;Hand   Activity Tolerance Patient tolerated treatment well   Patient Left in chair;with call bell/phone within reach;with chair alarm set   Nurse Communication          Time: 2122-4825 OT Time Calculation (min): 15 min  Charges: OT General Charges $OT Visit: 1 Visit OT Treatments $Self Care/Home Management : 8-22 mins  Lexie Koehl H. OTR/L Supplemental OT, Department of rehab services [phone]   Tilak Oakley R H. 10/17/2020, 8:14 AM

## 2020-10-17 NOTE — Progress Notes (Signed)
Physical Therapy Treatment Patient Details Name: [name] MRN: [id number] DOB: 1961/05/06 Today's Date: 10/17/2020    History of Present Illness This 59 y.o. female with PMH significant for ESRD on HD, T2DM, HLD, CAD with stents, chronic diastolic CHF, A. 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  Patient was brought to the ED on 12/18 from dialysis center for evaluation of decreased responsiveness.  Pt admitted with acute encephalopathy, afib with RVR, and elevated troponin/NSTEMI with cardio consulted. s/p revision R AV fistula, insertion of tunneled dialysis catheter on 12/27.    PT Comments    Pt making progress through increasing distance ambulated this date. She performed all transfers and ambulated up to ~310 ft without a rest break with a RW and only min guard assist for safety. She did not display any overt LOB, but poor safety awareness through requiring repeated cues for proper hand placement with transfers and proper body placement within RW throughout gait. Pt continues to display poor posture but did improve gait speed, step length, and feet clearance some when cued. She continues to demonstrate orientation only to her name and swelling in her R hand, resulting in guarding and poor grip on the RW. Will continue to follow acutely. Current recommendations remain appropriate.    Follow Up Recommendations  Supervision/Assistance - 24 hour;Home health PT     Equipment Recommendations  None recommended by PT    Recommendations for Other Services       Precautions / Restrictions Precautions Precautions: Fall Precaution Comments: High Fall Risk Restrictions Weight Bearing Restrictions: No    Mobility  Bed Mobility Overal bed mobility: Needs Assistance Bed Mobility: Supine to Sit     Supine to sit: Min guard     General bed mobility comments: Pt sitting up in recliner upon  arrival.  Transfers Overall transfer level: Needs assistance Equipment used: Rolling walker (2 wheeled) Transfers: Sit to/from Stand Sit to Stand: Min guard         General transfer comment: VCs for hand placement to push up on arm rest to come to stand and to reach back for arm rest prior to returning to sit, with exra time to process and respond. Pt guarding and holding R UE isometrically in place but unable to flex shoulder with AROM when cued.  Ambulation/Gait Ambulation/Gait assistance: Min guard Gait Distance (Feet): 310 Feet Assistive device: Rolling walker (2 wheeled) Gait Pattern/deviations: Step-through pattern;Decreased stride length;Trunk flexed;Narrow base of support Gait velocity: decr Gait velocity interpretation: <1.8 ft/sec, indicate of risk for recurrent falls General Gait Details: Min guard for safety but no overt LOB. VCs and tactile cues for placement proximal and mdiline within RW as she tends to drift towards R side of RW and flex trunk. Cues to correct posture, without succes. Cues to increase step length and feet clearance, with mod success. Hold R hand on grip partially due to hand swollen. Able to inc speed min when cued.   Stairs             Wheelchair Mobility    Modified Rankin (Stroke Patients Only)       Balance Overall balance assessment: Needs assistance Sitting-balance support: Feet supported;No upper extremity supported Sitting balance-Leahy Scale: Fair Sitting balance - Comments: Sitting in recliner upon arrival.   Standing balance support: During functional activity;Bilateral upper extremity supported Standing balance-Leahy Scale: Poor Standing balance comment: Reliant on UE support for balance, min guard for safety and no  LOB.                            Cognition Arousal/Alertness: Awake/alert Behavior During Therapy: Flat affect Overall Cognitive Status: History of cognitive impairments - at baseline Area of  Impairment: Orientation;Attention;Memory;Following commands;Safety/judgement;Awareness;Problem solving                 Orientation Level: Disoriented to;Place;Time;Situation (disoriented to DOB; knew she was in a hospital) Current Attention Level: Sustained Memory: Decreased short-term memory Following Commands: Follows one step commands consistently;Follows one step commands with increased time Safety/Judgement: Decreased awareness of safety;Decreased awareness of deficits Awareness: Emergent Problem Solving: Slow processing;Requires verbal cues;Requires tactile cues;Decreased initiation General Comments: Pt oriented to name and knew she was in a hospital, but did not know her DOB or which hospital. Pt required repeated cues for hand placement with transfers, extra time to respond. Slow processing this date.      Exercises      General Comments        Pertinent Vitals/Pain Pain Assessment: Faces Faces Pain Scale: Hurts even more Pain Location: RUE, s/p fistula revision with significant edema in R hand Pain Descriptors / Indicators: Discomfort;Grimacing;Guarding Pain Intervention(s): Limited activity within patient's tolerance;Monitored during session;Repositioned (pt denied pain though)    Home Living                      Prior Function            PT Goals (current goals can now be found in the care plan section) Acute Rehab PT Goals Patient Stated Goal: To return home. PT Goal Formulation: With patient Time For Goal Achievement: 10/30/20 Potential to Achieve Goals: Good Progress towards PT goals: Progressing toward goals    Frequency    Min 3X/week      PT Plan Current plan remains appropriate    Co-evaluation              AM-PAC PT "6 Clicks" Mobility   Outcome Measure  Help needed turning from your back to your side while in a flat bed without using bedrails?: A Little Help needed moving from lying on your back to sitting on the side  of a flat bed without using bedrails?: A Little Help needed moving to and from a bed to a chair (including a wheelchair)?: A Little Help needed standing up from a chair using your arms (e.g., wheelchair or bedside chair)?: A Little Help needed to walk in hospital room?: A Little Help needed climbing 3-5 steps with a railing? : A Little 6 Click Score: 18    End of Session Equipment Utilized During Treatment: Gait belt Activity Tolerance: Patient tolerated treatment well Patient left: in chair;with call bell/phone within reach;with chair alarm set Nurse Communication: Mobility status PT Visit Diagnosis: Unsteadiness on feet (R26.81);Other abnormalities of gait and mobility (R26.89)     Time: 0263-7858 PT Time Calculation (min) (ACUTE ONLY): 16 min  Charges:  $Gait Training: 8-22 mins                     Moishe Spice, PT, DPT Acute Rehabilitation Services  Pager: [phone] Office: [place] 10/17/2020, 9:13 AM

## 2020-10-17 NOTE — Progress Notes (Signed)
[facility] Progress Note   Subjective:  Seen in room, no c/o's, may be going home  Objective Vitals:   10/17/20 0339 10/17/20 0802 10/17/20 1133 10/17/20 1134  BP: 133/62 (!) 132/53 130/62 130/62  Pulse: 77 78 68 68  Resp: 16 18 18 18   Temp: 98.1 F (36.7 C) 98.5 F (36.9 C) 98.5 F (36.9 C) 98.5 F (36.9 C)  TempSrc: Oral Oral Oral Oral  SpO2: 99%   100%  Weight: 42.5 kg      Physical Exam General: Chronically ill appearing woman, NAD Heart: RRR; no murmur Lungs: CTA anteriorly Abdomen: soft, non-tender Extremities: No LE edema Dialysis Access:  R TDC in use, RUE wrapped/bandaged  Additional Objective Labs: Basic Metabolic Panel: Recent Labs  Lab 10/12/20 1127 10/14/20 1400 10/15/20 1004 10/16/20 0438  NA 133* 134* 136 134*  K 5.2* 6.0* 4.9 5.9*  CL 92* 94* 101 96*  CO2 22 25  --  24  GLUCOSE 129* 95 78 104*  BUN 100* 80* 37* 52*  CREATININE 12.32* 9.53* 6.00* 7.43*  CALCIUM 9.2 9.6  --  9.3  PHOS 9.0* 6.8*  --   --    Liver Function Tests: Recent Labs  Lab 10/12/20 1127 10/14/20 1400  ALBUMIN 2.9* 3.0*   CBC: Recent Labs  Lab 10/12/20 1127 10/14/20 1400 10/15/20 1004 10/16/20 0438 10/16/20 1301  WBC 4.3 5.2  --  8.3 8.2  HGB 11.3* 11.3* 11.9* 9.3* 10.9*  HCT 32.8* 35.0* 35.0* 29.1* 32.0*  MCV 88.4 91.9  --  91.5 89.9  PLT 187 212  --  194 220   Studies/Results: No results found. Medications: . sodium chloride    . sodium chloride    . sodium chloride Stopped (10/11/20 1247)   .  stroke: mapping our early stages of recovery book   Does not apply Once  . (feeding supplement) PROSource Plus  30 mL Oral BID BM  . amiodarone  200 mg Oral Daily  . aspirin EC  81 mg Oral Daily  . calcitRIOL  0.5 mcg Oral Q T,Th,Sa-HD  . Chlorhexidine Gluconate Cloth  6 each Topical Q0600  . cinacalcet  120 mg Oral Q T,Th,Sa-HD  . feeding supplement (NEPRO CARB STEADY)  237 mL Oral BID BM  . heparin sodium (porcine)  3,200 Units  Intravenous Q T,Th,Sa-HD  . melatonin  5 mg Oral QHS  . metoprolol tartrate  25 mg Oral BID  . multivitamin  1 tablet Oral QHS  . mupirocin ointment  1 application Nasal BID  . rosuvastatin  10 mg Oral Daily  . sucroferric oxyhydroxide  1,000 mg Oral TID WC    Dialysis Orders: TTS -[facility] 4hrs, BFR400, HBZ169, EDW 37kg,2K/2Ca RUE AVF no heparin - Sensipar 120mg  qHD - Calcitriol 33mcg qHD  Assessment/Plan: 1. AMS: On admit, negative work-up including CT head/neck and EEG. Improved, to baseline. 2. ESRD: cont HD TTS.  New TDC on 12/27. Next HD tomorrow as outpatient.  3. HTN/volume: BP's good. Wt's up, not sure accuracy 4. Anemia: Hgb 9.3 post-op. follow 5. Secondary hyperparathyroidism: CorrCa a little high, Phos high - continue binders/VDRA, ^ sensipar to usual dose. 6. Nutrition:   7. NSTEMI/demand ischemia: Known CAD. TTE with preserved EF. Med management. 8. Hx seizure disorder 9. Hx ICH x 2 with VP shunt 10. A-fib with RVR: Resolved, on amio + metoprolol. 11. Aneurysmal AVF: S/p plication 67/89 - will use TDC for 6 weeks while arm heals. Per [facility]. 12.  Dispo - for  dc home today.    Kelly Splinter, MD 10/17/2020, 1:44 PM

## 2020-10-17 NOTE — TOC Transition Note (Signed)
Transition of Care [facility]) - CM/SW Discharge Note   Patient Details  Name: Bryony Kaman MRN: [id number] Date of Birth: 1960-12-09  [facility] CM/SW Contact:  Pollie Friar, RN Phone Number: 10/17/2020, 11:50 AM   Clinical Narrative:    Pt discharging home with her daughter [place]. HH arranged through [facility]. They will call the daughter for the first home visit. Pt has transportation home.   Final next level of care: Home w Home Health Services Barriers to Discharge: No Barriers Identified   Patient Goals and CMS Choice   CMS Medicare.gov Compare Post Acute Care list provided to:: Patient Represenative (must comment) Choice offered to / list presented to : Adult Children  Discharge Placement                       Discharge Plan and Services                          HH Arranged: PT HH Agency: [facility] Date [place]: 10/17/20      Social Determinants of Health (SDOH) Interventions     Readmission Risk Interventions No flowsheet data found.

## 2020-10-17 NOTE — Progress Notes (Signed)
Daughter here at main entrance. Pt d/c'd via wheelchair with belongings. D/c instructions taken down by RN, and reviewed with daughter again.  No further questions by daughter.

## 2020-10-17 NOTE — Discharge Instructions (Addendum)
daily dressing change with nonadherent gauze over incision line of right arm fistula site        s.ncision  [facility]  Discharge Instructions  AV Fistula or Graft Surgery for Dialysis Access  Please refer to the following instructions for your post-procedure care. Your surgeon or physician assistant will discuss any changes with you.  Activity  You may drive the day following your surgery, if you are comfortable and no longer taking prescription pain medication. Resume full activity as the soreness in your incision resolves.  Bathing/Showering  You may shower after you go home. Keep your incision dry for 48 hours. Do not soak in a bathtub, hot tub, or swim until the incision heals completely. You may not shower if you have a hemodialysis catheter.  Incision Care  Clean your incision with mild soap and water after 48 hours. Pat the area dry with a clean towel. You do not need a bandage unless otherwise instructed. Do not apply any ointments or creams to your incision. You may have skin glue on your incision. Do not peel it off. It will come off on its own in about one week. Your arm may swell a bit after surgery. To reduce swelling use pillows to elevate your arm so it is above your heart. Your doctor will tell you if you need to lightly wrap your arm with an ACE bandage.  Diet  Resume your normal diet. There are not special food restrictions following this procedure. In order to heal from your surgery, it is CRITICAL to get adequate nutrition. Your body requires vitamins, minerals, and protein. Vegetables are the best source of vitamins and minerals. Vegetables also provide the perfect balance of protein. Processed food has little nutritional value, so try to avoid this.  Medications  Resume taking all of your medications. If your incision is causing pain, you may take over-the counter pain relievers such as acetaminophen (Tylenol). If you were  prescribed a stronger pain medication, please be aware these medications can cause nausea and constipation. Prevent nausea by taking the medication with a snack or meal. Avoid constipation by drinking plenty of fluids and eating foods with high amount of fiber, such as fruits, vegetables, and grains.   Do not take Tylenol if you are taking prescription pain medications.  Follow up Your surgeon may want to see you in the office following your access surgery. If so, this will be arranged at the time of your surgery.  Please call us immediately for any of the following conditions:  . Increased pain, redness, drainage (pus) from your incision site . Fever of 101 degrees or higher . Severe or worsening pain at your incision site . Hand pain or numbness. .  Reduce your risk of vascular disease:  . Stop smoking. If you would like help, call [facility] at 1-800-QUIT-NOW (515)258-4135) or [place] at (217)511-4347  . Manage your cholesterol . Maintain a desired weight . Control your diabetes . Keep your blood pressure down  Dialysis  It will take several weeks to several months for your new dialysis access to be ready for use. Your surgeon will determine when it is okay to use it. Your nephrologist will continue to direct your dialysis. You can continue to use your Permcath until your new access is ready for use.   10/15/2020 [name] [id number] November 27, 1960  Surgeon(s): Cherre Robins, MD  Procedure(s): REVISON OF ARTERIOVENOUS FISTULA ARM INSERTION OF TUNNELED DIALYSIS CATHETER   May  stick graft immediately   May stick graft on designated area only:   X Do not stick right AV fistula for 6 weeks    If you have any questions, please call the office at [phone].

## 2020-10-17 NOTE — Progress Notes (Signed)
  Progress Note    10/17/2020 7:16 AM 2 Days Post-Op  Subjective: Complaining of postoperative right upper extremity pain and difficulty with mobility.   Vitals:   10/16/20 2355 10/17/20 0339  BP: 131/60 133/62  Pulse: 79 77  Resp: 16 16  Temp: 98.3 F (36.8 C) 98.1 F (36.7 C)  SpO2: 100% 99%    Physical Exam: Cardiac: Rate and rhythm are regular Lungs: Nonlabored  Incisions: Right upper arm staple line intact Extremities: Hand is warm with 2+ radial pulse.  Weak grip strength currently.  Good bruit and thrill in fistula.  Slight oozing at skin edge with dressing change  CBC    Component Value Date/Time   WBC 8.2 10/16/2020 1301   RBC 3.56 (L) 10/16/2020 1301   HGB 10.9 (L) 10/16/2020 1301   HCT 32.0 (L) 10/16/2020 1301   PLT 220 10/16/2020 1301   MCV 89.9 10/16/2020 1301   MCH 30.6 10/16/2020 1301   MCHC 34.1 10/16/2020 1301   RDW 15.1 10/16/2020 1301   LYMPHSABS 1.6 10/06/2020 1728   MONOABS 0.3 10/06/2020 1728   EOSABS 0.0 10/06/2020 1728   BASOSABS 0.0 10/06/2020 1728    BMET    Component Value Date/Time   NA 134 (L) 10/16/2020 0438   K 5.9 (H) 10/16/2020 0438   CL 96 (L) 10/16/2020 0438   CO2 24 10/16/2020 0438   GLUCOSE 104 (H) 10/16/2020 0438   BUN 52 (H) 10/16/2020 0438   CREATININE 7.43 (H) 10/16/2020 0438   CALCIUM 9.3 10/16/2020 0438   GFRNONAA 6 (L) 10/16/2020 0438   GFRAA 5 (L) 02/03/2019 9924     Intake/Output Summary (Last 24 hours) at 10/17/2020 0716 Last data filed at 10/16/2020 1024 Gross per 24 hour  Intake --  Output 1500 ml  Net -1500 ml    HOSPITAL MEDICATIONS Scheduled Meds: .  stroke: mapping our early stages of recovery book   Does not apply Once  . (feeding supplement) PROSource Plus  30 mL Oral BID BM  . amiodarone  200 mg Oral Daily  . aspirin EC  81 mg Oral Daily  . calcitRIOL  0.5 mcg Oral Q T,Th,Sa-HD  . Chlorhexidine Gluconate Cloth  6 each Topical Q0600  . cinacalcet  120 mg Oral Q T,Th,Sa-HD  . feeding  supplement (NEPRO CARB STEADY)  237 mL Oral BID BM  . heparin sodium (porcine)  3,200 Units Intravenous Q T,Th,Sa-HD  . melatonin  5 mg Oral QHS  . metoprolol tartrate  25 mg Oral BID  . multivitamin  1 tablet Oral QHS  . mupirocin ointment  1 application Nasal BID  . rosuvastatin  10 mg Oral Daily  . sucroferric oxyhydroxide  1,000 mg Oral TID WC   Continuous Infusions: . sodium chloride    . sodium chloride    . sodium chloride Stopped (10/11/20 1247)   PRN Meds:.sodium chloride, sodium chloride, acetaminophen **OR** acetaminophen (TYLENOL) oral liquid 160 mg/5 mL **OR** acetaminophen, hydrALAZINE, lidocaine (PF), lidocaine-prilocaine, oxyCODONE-acetaminophen, pentafluoroprop-tetrafluoroeth  Assessment:59 y.o. female is s/p revision of R arm fistula and placement of R IJ TDC 9+99 2 Day Post-Op        Plan: -Dressing removed and replaced.  Recommend daily dressing change with nonadherent gauze over incision line. -Do not cannulate right upper extremity AV fistula for 6 weeks. -Follow-up with [facility] arranged.    Risa Grill, PA-C [facility] [phone] 10/17/2020  7:16 AM

## 2020-10-18 ENCOUNTER — Telehealth (HOSPITAL_COMMUNITY): Payer: Self-pay | Admitting: Nephrology

## 2020-10-18 NOTE — Telephone Encounter (Signed)
Transition of care contact from inpatient facility  Date of discharge: 10/16/20 Date of contact: 10/17/20 Method: Phone Spoke to: Patient's daughter  Patient contacted to discuss transition of care from recent inpatient hospitalization. Patient was admitted to [facility] from 12/18 - 10/16/20 with discharge diagnosis of AMS, A-fib with RVR, NSTEMI, and aneurysmal AVF (s/p plication)  Medication changes were reviewed. New meds are crestor and metoprolol - picked up and started without issues.  Patient will follow up with his/her outpatient HD unit on: TTS sched - daughter reports that she is there now.  They deny need for any additional PT or nursing needs at this time.  Daughter asked me to confirm how often her arm bandage needs to be changed - per [facility]VS note, should be changed daily. Daughter understands.  [name], PA-C [employer] Pager [phone]

## 2020-10-23 ENCOUNTER — Telehealth: Payer: Self-pay

## 2020-10-23 ENCOUNTER — Telehealth: Payer: Self-pay | Admitting: Licensed Clinical Social Worker

## 2020-10-23 NOTE — Telephone Encounter (Signed)
CSW received referral for pt to be connected w/ [employer] department. New appointment scheduled for Friday 1/7. CSW aware pt at dialysis until 4pm today, will call to complete assessment first thing tomorrow 1/5.   Westley Hummer, MSW, [place]  [phone]

## 2020-10-23 NOTE — Telephone Encounter (Signed)
Follow up call got patient scheduled for 2:20pm on 1/7 as New patient with Dr. Harrell Gave.

## 2020-10-23 NOTE — Telephone Encounter (Signed)
Called patient to move 1/5 appt to another time due to schedule changes. Patient unable to come in today 1/4 @ 2pm as she has dialysis on T/Th/Sat from 11a-4pm, per daughter.

## 2020-10-24 ENCOUNTER — Telehealth: Payer: Self-pay | Admitting: Licensed Clinical Social Worker

## 2020-10-24 ENCOUNTER — Ambulatory Visit: Payer: 59 | Admitting: Physician Assistant
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
## 2020-10-25 ENCOUNTER — Telehealth: Payer: Self-pay | Admitting: Licensed Clinical Social Worker

## 2020-10-25 NOTE — Telephone Encounter (Signed)
Confirmed pt enrolled in transportation services. Pt daughter has spoken w/ Earl Many and I will assist with completion of paperwork for rider waiver during appointment tomorrow.   Westley Hummer, MSW, [place]  [phone]

## 2020-10-26 ENCOUNTER — Encounter: Payer: Self-pay | Admitting: Cardiology

## 2020-10-26 ENCOUNTER — Ambulatory Visit (INDEPENDENT_AMBULATORY_CARE_PROVIDER_SITE_OTHER): Payer: 59 | Admitting: Cardiology

## 2020-10-26 ENCOUNTER — Other Ambulatory Visit: Payer: Self-pay

## 2020-10-26 VITALS — BP 118/58 | HR 145 | Ht 61.0 in | Wt 89.8 lb

## 2020-10-26 DIAGNOSIS — Z789 Other specified health status: Secondary | ICD-10-CM

## 2020-10-26 DIAGNOSIS — I480 Paroxysmal atrial fibrillation: Secondary | ICD-10-CM

## 2020-10-26 DIAGNOSIS — N186 End stage renal disease: Secondary | ICD-10-CM | POA: Diagnosis not present

## 2020-10-26 DIAGNOSIS — E7800 Pure hypercholesterolemia, unspecified: Secondary | ICD-10-CM

## 2020-10-26 DIAGNOSIS — I10 Essential (primary) hypertension: Secondary | ICD-10-CM

## 2020-10-26 DIAGNOSIS — Z992 Dependence on renal dialysis: Secondary | ICD-10-CM

## 2020-10-26 DIAGNOSIS — I2510 Atherosclerotic heart disease of native coronary artery without angina pectoris: Secondary | ICD-10-CM | POA: Diagnosis not present

## 2020-10-26 NOTE — Patient Instructions (Addendum)
Medication Instructions:  Your Physician recommend you continue on your current medication as directed.    Your heart rate is fast today. Please make sure you are taking the metoprolol and amiodarone at home. If you are, you can take an extra metoprolol dose tonight to make sure your heart rate is improved. We will see you in close follow up to make sure this gets better.  *If you need a refill on your cardiac medications before your next appointment, please call your pharmacy*   Lab Work: None  Testing/Procedures: None   Follow-Up: At [facility], you and your health needs are our priority.  As part of our continuing mission to provide you with exceptional heart care, we have created designated Provider Care Teams.  These Care Teams include your primary Cardiologist (physician) and Advanced Practice Providers (APPs -  Physician Assistants and Nurse Practitioners) who all work together to provide you with the care you need, when you need it.  We recommend signing up for the patient portal called "MyChart".  Sign up information is provided on this After Visit Summary.  MyChart is used to connect with patients for Virtual Visits (Telemedicine).  Patients are able to view lab/test results, encounter notes, upcoming appointments, etc.  Non-urgent messages can be sent to your provider as well.   To learn more about what you can do with MyChart, go to [email or web address].    Your next appointment:   2-3 week(s) (Please schedule a time when daughter can attend appointment).   The format for your next appointment:   In Person  Provider:   Dr. Harrell Gave

## 2020-10-26 NOTE — Progress Notes (Signed)
CSW spoke with pt before her appointment w/ Dr. Harrell Gave today. She came alone to the appointment via [facility] and is okay taking it home again today. I shared that I was aware that sometimes transportation is tough with everything that her daughter has going on- she agrees. I shared with her the resources that I had gathered for additional transportation services that would allow her to independently get to her dialysis appointments. I included my card with my number highlighted for the pt to keep. Her daughter, Ms. Bolton already has my number and has reached me via text and phone call as needed.   I explained to RN Lars Mage that pt may possibly need assistance w/ scheduling ride home.  I contacted pt daughter Wilford Sports, since pt was alone today. She had her own appointment and was not able to come. We had previously discussed that pt daughter had been working on previously due bills that had built up with everything going on with pt and pt daughter health wise. I requested if possible for her to bring her outstanding Educational psychologist by the [employer] office for [place] to submit to [employer], pt daughter will try and bring this by the office Monday before noon. CSW had discussed this need with team lead Kennyth Lose, LCSW, this morning and I will send her the bill when I receive it.   I remain available should additional concerns arise.   Westley Hummer, MSW, [place]  [phone]

## 2020-10-26 NOTE — Progress Notes (Signed)
Cardiology Office Note:    Date:  10/26/2020   ID:  [name], DOB 01/14/1961, MRN [id number]  PCP:  Clovia Cuff, MD  Cardiologist:  Buford Dresser, MD  Referring MD: Garwin Brothers, MD   CC: 14 day transition of care post discharge follow up  History of Present Illness:    [name] is a 60 y.o. 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  She was discharged from the hospital on 10/16/20. Discharge summary, hospital course, and full medication list reviewed today. Notable cardiac conditions during hospitalization include atrial fibrillation with RVR and NSTEMI. Not a candidate for long term anticoagulation given history of ICH. Not felt to be a good candidate for cath, recommended for medical management.  Today: She is here alone for her visit. I attempted to call her daughter Wilford Sports, who patient says manages her medications, but I was unable to reach her and her mailbox was full.  Feels that heart has been racing for a long time. Has been having chest discomfort; it is sharp, constant, moves all over her chest. No clear aggravating/alleviating factors.   Doesn't remember the last time she had dialysis. However, on review of care everywhere, appears she is followed at [facility] with last dialysis session yesterday. She does not recall this. She does not know if her blood pressure ever drops low with dialysis.   Overall feels very poorly. Doesn't want to go back into hospital.   Denies shortness of breath at rest or with normal exertion. No PND, orthopnea, LE edema or unexpected weight gain. No syncope.  Past Medical History:  Diagnosis Date  . Anterior cerebral artery aneurysm    1996 AT [facility]  .  Coronary atherosclerosis of native coronary artery    STEMI Feb 2009 - 70% distal LAD lesion c/w plaque rupture, DES distal RCA  . Diastolic heart failure    LVEF 60-65% with grade 2 diastolic dysfunction - July 2014  . End-stage renal disease on hemodialysis ([place]) 02/03/2019  . ESRD (end stage renal disease) on dialysis ([place])   . HTN (hypertension)    Resistant  . Hypercholesterolemia   . Subarachnoid hemorrhage ([place])    03/2012  . Tobacco abuse    Resumed smoking half a pack a day. She was a smoker in the past and states she was abl eto stop in the past using nicotine patches  . Unspecified atrial fibrillation ([place]) 02/03/2019    Past Surgical History:  Procedure Laterality Date  . ABDOMINAL HYSTERECTOMY    . BASCILIC VEIN TRANSPOSITION Right 05/13/2013   Procedure: [place];  Surgeon: Rosetta Posner, MD;  Location: [place];  Service: Vascular;  Laterality: Right;  . BRAIN SURGERY     2   . INSERTION OF DIALYSIS CATHETER Right 10/15/2020   Procedure: INSERTION OF TUNNELED DIALYSIS CATHETER;  Surgeon: Cherre Robins, MD;  Location: [place];  Service: Vascular;  Laterality: Right;  . LOOP RECORDER IMPLANT N/A 11/14/2013   Procedure: LOOP RECORDER IMPLANT;  Surgeon: Evans Lance, MD;  Location[facility] CATH LAB;  Service: Cardiovascular;  Laterality: N/A;  . REVISON OF ARTERIOVENOUS FISTULA Right 10/15/2020   Procedure: REVISON OF ARTERIOVENOUS FISTULA ARM;  Surgeon: Jamelle Haring  N, MD;  Location: [place];  Service: Vascular;  Laterality: Right;  . TEE WITHOUT CARDIOVERSION N/A 11/14/2013   Procedure: TRANSESOPHAGEAL ECHOCARDIOGRAM (TEE);  Surgeon: Candee Furbish, MD;  Location: [facility] ENDOSCOPY;  Service: Cardiovascular;  Laterality: N/A;  . VENTRICULOPERITONEAL SHUNT  03/27/2012   Procedure: SHUNT INSERTION VENTRICULAR-PERITONEAL;  Surgeon: Winfield Cunas, MD;  Location: [place] NEURO ORS;  Service: Neurosurgery;  Laterality: N/A;  Ventricular-Peritoneal Shunt Insertion    Current  Medications: Current Outpatient Medications on File Prior to Visit  Medication Sig  . amiodarone (PACERONE) 200 MG tablet Take 2 tabs (400mg ) daily for 7 days, then 1 tab (200mg ) daily thereafter  . aspirin 81 MG EC tablet Take by mouth.  . calcitRIOL (ROCALTROL) 0.5 MCG capsule Take 1 capsule (0.5 mcg total) by mouth Every Tuesday,Thursday,and Saturday with dialysis.  Marland Kitchen calcium acetate (PHOSLO) 667 MG capsule Take 667 mg by mouth 3 (three) times daily.  . iron sucrose in sodium chloride 0.9 % 100 mL Iron Sucrose (Venofer)  . lactulose (CHRONULAC) 10 GM/15ML solution Take 10 g by mouth daily as needed for moderate constipation.   . levETIRAcetam (KEPPRA) 500 MG tablet Take 500 mg by mouth 2 (two) times daily.  . metoprolol tartrate (LOPRESSOR) 25 MG tablet Take 1 tablet (25 mg total) by mouth 2 (two) times daily.  . multivitamin (RENA-VIT) TABS tablet Take 1 tablet by mouth daily.  . nitroGLYCERIN (NITROSTAT) 0.4 MG SL tablet Place 1 tablet (0.4 mg total) under the tongue every 5 (five) minutes as needed for chest pain.  Marland Kitchen omeprazole (PRILOSEC) 20 MG capsule Take 20 mg by mouth daily.  . sucroferric oxyhydroxide (VELPHORO) 500 MG chewable tablet Chew 2 tablets (1,000 mg total) by mouth 3 (three) times daily with meals. (Patient taking differently: Chew 500-1,500 mg by mouth See admin instructions. Crush or chew and swallow 3 tablets (1500mg ) by mouth three times daily with meals and 1 tablet (500mg ) by mouth twice daily with snacks.)   No current facility-administered medications on file prior to visit.     Allergies:   Amoxicillin and Penicillins   Social History   Tobacco Use  . Smoking status: Former Smoker    Packs/day: 0.20    Years: 35.00    Pack years: 7.00    Types: Cigarettes    Quit date: 03/20/2017    Years since quitting: 3.6  . Smokeless tobacco: Never Used  . Tobacco comment: 1/2 ppd   Vaping Use  . Vaping Use: Never used  Substance Use Topics  . Alcohol use: No  .  Drug use: No    Family History: family history includes Coronary artery disease in an other family member; Heart disease in her father and mother.  ROS:   Please see the history of present illness.  Additional pertinent ROS as below, though she is a limited historian. Constitutional: Negative for chills, fever, night sweats, unintentional weight loss  HENT: Negative for ear pain and hearing loss.   Eyes: Negative for loss of vision and eye pain.  Respiratory: Negative for cough, sputum, wheezing.   Cardiovascular: See HPI. Gastrointestinal: Negative for abdominal pain, melena, and hematochezia.  Genitourinary: Negative for dysuria and hematuria.  Musculoskeletal: Negative for falls and myalgias.  Skin: Negative for itching and rash.  Neurological: Negative for focal weakness, focal sensory changes and loss of consciousness.  Endo/Heme/Allergies: Does not bruise/bleed easily.     EKGs/Labs/Other Studies Reviewed:    The following studies were reviewed today: Echo 10/07/2020 1. Left ventricular  ejection fraction, by estimation, is 65 to 70%. The  left ventricle has normal function. The left ventricle has no regional  wall motion abnormalities. There is severe left ventricular hypertrophy.  Left ventricular diastolic parameters  are indeterminate. Elevated left atrial pressure.  2. Right ventricular systolic function is normal. The right ventricular  size is normal. There is normal pulmonary artery systolic pressure.  3. Left atrial size was mildly dilated.  4. The mitral valve is normal in structure. No evidence of mitral valve  regurgitation. No evidence of mitral stenosis.  5. The aortic valve is tricuspid. There is mild calcification of the  aortic valve. There is mild thickening of the aortic valve. Aortic valve  regurgitation is not visualized. No aortic stenosis is present.  6. The inferior vena cava is normal in size with greater than 50%  respiratory variability,  suggesting right atrial pressure of 3 mmHg.  EKG:  EKG is personally reviewed.  The ekg ordered today demonstrates atrial fibrillation with RVR at 145 bpm.  Recent Labs: 10/07/2020: TSH 0.539 10/10/2020: ALT 13; Magnesium 2.2 10/16/2020: BUN 52; Creatinine, Ser 7.43; Hemoglobin 10.9; Platelets 220; Potassium 5.9; Sodium 134  Recent Lipid Panel    Component Value Date/Time   CHOL 219 (H) 10/07/2020 0205   TRIG 80 10/07/2020 0205   HDL 59 10/07/2020 0205   CHOLHDL 3.7 10/07/2020 0205   VLDL 16 10/07/2020 0205   LDLCALC 144 (H) 10/07/2020 0205    Physical Exam:    VS:  BP (!) 118/58   Pulse (!) 145   Ht 5\' 1"  (1.549 m)   Wt 89 lb 12.8 oz (40.7 kg)   SpO2 99%   BMI 16.97 kg/m     Wt Readings from Last 3 Encounters:  10/26/20 89 lb 12.8 oz (40.7 kg)  10/17/20 93 lb 11.1 oz (42.5 kg)  12/06/19 103 lb 4.8 oz (46.9 kg)    GEN: frail, chronically ill appearing woman in no acute distress HEENT: Normal, moist mucous membranes NECK: No JVD CARDIAC: regular rhythm, normal S1 and S2, no rubs or gallops. 3/6 systolic murmur. VASCULAR: Radial and DP pulses 2+ bilaterally. No carotid bruits RESPIRATORY:  Clear to auscultation without rales, wheezing or rhonchi  ABDOMEN: Soft, non-tender, non-distended MUSCULOSKELETAL:  Ambulates independently SKIN: Warm and dry, no edema NEUROLOGIC:  Oriented to self. Limited historian. No focal neuro deficits noted. PSYCHIATRIC:  Normal affect    ASSESSMENT:    1. Paroxysmal atrial fibrillation with RVR ([place])   2. End-stage renal disease on hemodialysis ([place])   3. Coronary artery disease involving native coronary artery of native heart without angina pectoris   4. Primary hypertension   5. Pure hypercholesterolemia, unspecified   6. Poor historian    PLAN:    Atrial fibrillation, paroxysmal, today in RVR -not on anticoagulation due to history of ICH/SAH -was recently in RVR in the hospital, converted chemically -she cannot tell me how she  is taking her medications. Attempted to contact daughter, unable to reach -discharged on amiodarone and metoprolol -on her AVS, directed to confirm that she is taking amiodarone and metoprolol. Instructed to take extra dose of metoprolol. -consider diltiazem if RVR persists -will see if we can get home nurse for vitals and assistance with medications -appreciate social work help with transportation  ESRD on HD -per chart, last HD session yesterday, though she does not recall this  Poor historian -cannot give me details, does not recall going to dialysis yesterday, can answer only basic questions. She  is able to tell me that she does not want to go back to the hospital. -she is appropriate, conversational, not consistent with encephalopathy  CAD, with STEMI in 2009 and recent NSTEMI Hypertension Hypercholesterolemia -continue aspirin -she was discharged on rosuvastatin but not currently taking per report. Need to verify medication list -denies chest pain  Overall, she has many high risk medical comborbidities. I am very concerned, as she just left the hospital less that two weeks ago. She is back in atrial fibrillation with RVR. We will attempt to manage with medication and close follow up, as she does not want readmission. Complex medical decision making as we cannot anticoagulate her.  Plan for follow up: 2-3 weeks, with daughter present preferably  Buford Dresser, MD, PhD, [facility]    Medication Adjustments/Labs and Tests Ordered: Current medicines are reviewed at length with the patient today.  Concerns regarding medicines are outlined above.  Orders Placed This Encounter  Procedures  . EKG 12-Lead   No orders of the defined types were placed in this encounter.   Patient Instructions  Medication Instructions:  Your Physician recommend you continue on your current medication as directed.    Your heart rate is fast today. Please make sure you  are taking the metoprolol and amiodarone at home. If you are, you can take an extra metoprolol dose tonight to make sure your heart rate is improved. We will see you in close follow up to make sure this gets better.  *If you need a refill on your cardiac medications before your next appointment, please call your pharmacy*   Lab Work: None  Testing/Procedures: None   Follow-Up: At [facility], you and your health needs are our priority.  As part of our continuing mission to provide you with exceptional heart care, we have created designated Provider Care Teams.  These Care Teams include your primary Cardiologist (physician) and Advanced Practice Providers (APPs -  Physician Assistants and Nurse Practitioners) who all work together to provide you with the care you need, when you need it.  We recommend signing up for the patient portal called "MyChart".  Sign up information is provided on this After Visit Summary.  MyChart is used to connect with patients for Virtual Visits (Telemedicine).  Patients are able to view lab/test results, encounter notes, upcoming appointments, etc.  Non-urgent messages can be sent to your provider as well.   To learn more about what you can do with MyChart, go to [email or web address].    Your next appointment:   2-3 week(s) (Please schedule a time when daughter can attend appointment).   The format for your next appointment:   In Person  Provider:   Dr. Harrell Gave     Signed, Buford Dresser, MD PhD 10/26/2020 6:38 PM    [place]

## 2020-10-29 ENCOUNTER — Telehealth: Payer: Self-pay | Admitting: Licensed Clinical Social Worker

## 2020-10-29 NOTE — Telephone Encounter (Signed)
LCSW was able to reach out to [facility] (ph: [phone]) and confirm patient is active with their PT services starting 1/3. I inquired if she could have RN added to those orders and was told they would try and accommodate that request- they requested to have orders faxed to them at [phone] indicating need for RN and with appropriate instructions for what the clinic would like done. This was communicated to both Lars Mage, RN, and Dr. Harrell Gave.   I am aware that it was difficult to ascertain what pt current needs are given that pt daughter was not present to assist with hx during last appointment. If I am able to reach daughter today I will request that she be present during the next appointment or at least available via phone/video call if possible.   Care Navigation team remains available to assist as needed.   Westley Hummer, MSW, [place]  [phone]

## 2020-11-01 ENCOUNTER — Telehealth: Payer: Self-pay | Admitting: Licensed Clinical Social Worker

## 2020-11-01 NOTE — Telephone Encounter (Signed)
CSW received message from pt daughter inquiring if I could assist with transportation for pt to dialysis as she is sick. CSW directed pt daughter to call dialysis center and ask to speak with the social worker there as [employer] cannot take pt to dialysis regularly and they may need to make alternate arrangements since pt daughter is not feeling well and she lives w/ pt. I also let her know I had sent a list of additional community transportation resources home with pt after previous appointment. Pt daughter confirms those resources made it back home.  Remain available moving forward should pt/pt daughter need additional support. Pt daughter has not brought utility bill as requested and states she has not heard back yet from [facility]. I let her know that her voicemail is full and if that they may be trying to reach her and cannot leave a message.   Westley Hummer, MSW, [place]  [phone]

## 2020-11-06 ENCOUNTER — Telehealth: Payer: Self-pay | Admitting: Licensed Clinical Social Worker

## 2020-11-06 NOTE — Telephone Encounter (Signed)
Confirmed with Lars Mage, RN, that she and Dr. Harrell Gave will send in orders for [facility] when offices back open. Currently we are operating as remote only due to weather.   Westley Hummer, MSW, [place]  [phone]

## 2020-11-08 ENCOUNTER — Encounter (HOSPITAL_COMMUNITY): Payer: Self-pay

## 2020-11-08 ENCOUNTER — Emergency Department (HOSPITAL_COMMUNITY): Payer: 59

## 2020-11-08 ENCOUNTER — Inpatient Hospital Stay (HOSPITAL_COMMUNITY)
Admission: EM | Admit: 2020-11-08 | Discharge: 2020-11-27 | DRG: 981 | Disposition: A | Discharge status: Home Health | Payer: 59 | Attending: Family Medicine | Admitting: Family Medicine

## 2020-11-08 ENCOUNTER — Other Ambulatory Visit: Payer: Self-pay

## 2020-11-08 DIAGNOSIS — R1013 Epigastric pain: Secondary | ICD-10-CM | POA: Diagnosis not present

## 2020-11-08 DIAGNOSIS — E785 Hyperlipidemia, unspecified: Secondary | ICD-10-CM | POA: Diagnosis present

## 2020-11-08 DIAGNOSIS — I132 Hypertensive heart and chronic kidney disease with heart failure and with stage 5 chronic kidney disease, or end stage renal disease: Secondary | ICD-10-CM | POA: Diagnosis present

## 2020-11-08 DIAGNOSIS — Z982 Presence of cerebrospinal fluid drainage device: Secondary | ICD-10-CM

## 2020-11-08 DIAGNOSIS — N186 End stage renal disease: Secondary | ICD-10-CM

## 2020-11-08 DIAGNOSIS — R0682 Tachypnea, not elsewhere classified: Secondary | ICD-10-CM

## 2020-11-08 DIAGNOSIS — I82403 Acute embolism and thrombosis of unspecified deep veins of lower extremity, bilateral: Secondary | ICD-10-CM

## 2020-11-08 DIAGNOSIS — R54 Age-related physical debility: Secondary | ICD-10-CM | POA: Diagnosis present

## 2020-11-08 DIAGNOSIS — I2510 Atherosclerotic heart disease of native coronary artery without angina pectoris: Secondary | ICD-10-CM | POA: Diagnosis present

## 2020-11-08 DIAGNOSIS — E0590 Thyrotoxicosis, unspecified without thyrotoxic crisis or storm: Secondary | ICD-10-CM

## 2020-11-08 DIAGNOSIS — S62102A Fracture of unspecified carpal bone, left wrist, initial encounter for closed fracture: Secondary | ICD-10-CM

## 2020-11-08 DIAGNOSIS — Z955 Presence of coronary angioplasty implant and graft: Secondary | ICD-10-CM

## 2020-11-08 DIAGNOSIS — Z79899 Other long term (current) drug therapy: Secondary | ICD-10-CM

## 2020-11-08 DIAGNOSIS — R109 Unspecified abdominal pain: Secondary | ICD-10-CM

## 2020-11-08 DIAGNOSIS — D631 Anemia in chronic kidney disease: Secondary | ICD-10-CM | POA: Diagnosis present

## 2020-11-08 DIAGNOSIS — G40909 Epilepsy, unspecified, not intractable, without status epilepticus: Secondary | ICD-10-CM

## 2020-11-08 DIAGNOSIS — U071 COVID-19: Secondary | ICD-10-CM | POA: Diagnosis present

## 2020-11-08 DIAGNOSIS — K5641 Fecal impaction: Secondary | ICD-10-CM | POA: Diagnosis present

## 2020-11-08 DIAGNOSIS — I82411 Acute embolism and thrombosis of right femoral vein: Secondary | ICD-10-CM | POA: Diagnosis present

## 2020-11-08 DIAGNOSIS — I5032 Chronic diastolic (congestive) heart failure: Secondary | ICD-10-CM | POA: Diagnosis present

## 2020-11-08 DIAGNOSIS — R0902 Hypoxemia: Secondary | ICD-10-CM

## 2020-11-08 DIAGNOSIS — I953 Hypotension of hemodialysis: Secondary | ICD-10-CM | POA: Diagnosis not present

## 2020-11-08 DIAGNOSIS — E7800 Pure hypercholesterolemia, unspecified: Secondary | ICD-10-CM | POA: Diagnosis present

## 2020-11-08 DIAGNOSIS — I4891 Unspecified atrial fibrillation: Secondary | ICD-10-CM

## 2020-11-08 DIAGNOSIS — Z8249 Family history of ischemic heart disease and other diseases of the circulatory system: Secondary | ICD-10-CM

## 2020-11-08 DIAGNOSIS — Z7189 Other specified counseling: Secondary | ICD-10-CM

## 2020-11-08 DIAGNOSIS — D62 Acute posthemorrhagic anemia: Secondary | ICD-10-CM | POA: Diagnosis present

## 2020-11-08 DIAGNOSIS — R571 Hypovolemic shock: Secondary | ICD-10-CM | POA: Diagnosis present

## 2020-11-08 DIAGNOSIS — R0603 Acute respiratory distress: Secondary | ICD-10-CM

## 2020-11-08 DIAGNOSIS — I480 Paroxysmal atrial fibrillation: Principal | ICD-10-CM | POA: Diagnosis present

## 2020-11-08 DIAGNOSIS — G911 Obstructive hydrocephalus: Secondary | ICD-10-CM | POA: Diagnosis present

## 2020-11-08 DIAGNOSIS — R627 Adult failure to thrive: Secondary | ICD-10-CM | POA: Diagnosis present

## 2020-11-08 DIAGNOSIS — W19XXXA Unspecified fall, initial encounter: Secondary | ICD-10-CM

## 2020-11-08 DIAGNOSIS — I739 Peripheral vascular disease, unspecified: Secondary | ICD-10-CM | POA: Diagnosis present

## 2020-11-08 DIAGNOSIS — K5900 Constipation, unspecified: Secondary | ICD-10-CM

## 2020-11-08 DIAGNOSIS — Z9889 Other specified postprocedural states: Secondary | ICD-10-CM

## 2020-11-08 DIAGNOSIS — Z7982 Long term (current) use of aspirin: Secondary | ICD-10-CM

## 2020-11-08 DIAGNOSIS — R0602 Shortness of breath: Secondary | ICD-10-CM

## 2020-11-08 DIAGNOSIS — F17210 Nicotine dependence, cigarettes, uncomplicated: Secondary | ICD-10-CM | POA: Diagnosis present

## 2020-11-08 DIAGNOSIS — E440 Moderate protein-calorie malnutrition: Secondary | ICD-10-CM | POA: Insufficient documentation

## 2020-11-08 DIAGNOSIS — J90 Pleural effusion, not elsewhere classified: Secondary | ICD-10-CM

## 2020-11-08 DIAGNOSIS — W1830XA Fall on same level, unspecified, initial encounter: Secondary | ICD-10-CM | POA: Diagnosis present

## 2020-11-08 DIAGNOSIS — I9589 Other hypotension: Secondary | ICD-10-CM | POA: Diagnosis present

## 2020-11-08 DIAGNOSIS — G9389 Other specified disorders of brain: Secondary | ICD-10-CM

## 2020-11-08 DIAGNOSIS — Z992 Dependence on renal dialysis: Secondary | ICD-10-CM

## 2020-11-08 DIAGNOSIS — E8889 Other specified metabolic disorders: Secondary | ICD-10-CM | POA: Diagnosis present

## 2020-11-08 DIAGNOSIS — Z90710 Acquired absence of both cervix and uterus: Secondary | ICD-10-CM

## 2020-11-08 DIAGNOSIS — I252 Old myocardial infarction: Secondary | ICD-10-CM

## 2020-11-08 DIAGNOSIS — Z881 Allergy status to other antibiotic agents status: Secondary | ICD-10-CM

## 2020-11-08 DIAGNOSIS — I82409 Acute embolism and thrombosis of unspecified deep veins of unspecified lower extremity: Secondary | ICD-10-CM

## 2020-11-08 DIAGNOSIS — G9341 Metabolic encephalopathy: Secondary | ICD-10-CM | POA: Diagnosis present

## 2020-11-08 DIAGNOSIS — Z880 Allergy status to penicillin: Secondary | ICD-10-CM

## 2020-11-08 DIAGNOSIS — Z681 Body mass index (BMI) 19 or less, adult: Secondary | ICD-10-CM

## 2020-11-08 DIAGNOSIS — Z8673 Personal history of transient ischemic attack (TIA), and cerebral infarction without residual deficits: Secondary | ICD-10-CM

## 2020-11-08 DIAGNOSIS — S52615A Nondisplaced fracture of left ulna styloid process, initial encounter for closed fracture: Secondary | ICD-10-CM | POA: Diagnosis present

## 2020-11-08 DIAGNOSIS — E875 Hyperkalemia: Secondary | ICD-10-CM | POA: Diagnosis present

## 2020-11-08 DIAGNOSIS — R64 Cachexia: Secondary | ICD-10-CM | POA: Diagnosis present

## 2020-11-08 DIAGNOSIS — R112 Nausea with vomiting, unspecified: Secondary | ICD-10-CM

## 2020-11-08 DIAGNOSIS — J1282 Pneumonia due to coronavirus disease 2019: Secondary | ICD-10-CM | POA: Diagnosis present

## 2020-11-08 DIAGNOSIS — S52552A Other extraarticular fracture of lower end of left radius, initial encounter for closed fracture: Secondary | ICD-10-CM | POA: Diagnosis present

## 2020-11-08 DIAGNOSIS — I700 Atherosclerosis of aorta: Secondary | ICD-10-CM | POA: Diagnosis present

## 2020-11-08 DIAGNOSIS — N2581 Secondary hyperparathyroidism of renal origin: Secondary | ICD-10-CM | POA: Diagnosis present

## 2020-11-08 HISTORY — DX: Unspecified convulsions: R56.9

## 2020-11-08 HISTORY — DX: Other specified health status: Z78.9

## 2020-11-08 HISTORY — DX: Encephalopathy, unspecified: G93.40

## 2020-11-08 LAB — COMPREHENSIVE METABOLIC PANEL
ALT: 18 U/L (ref 0–44)
AST: 38 U/L (ref 15–41)
Albumin: 3.3 g/dL — ABNORMAL LOW (ref 3.5–5.0)
Alkaline Phosphatase: 61 U/L (ref 38–126)
Anion gap: 28 — ABNORMAL HIGH (ref 5–15)
BUN: 53 mg/dL — ABNORMAL HIGH (ref 6–20)
CO2: 21 mmol/L — ABNORMAL LOW (ref 22–32)
Calcium: 9.5 mg/dL (ref 8.9–10.3)
Chloride: 91 mmol/L — ABNORMAL LOW (ref 98–111)
Creatinine, Ser: 9.53 mg/dL — ABNORMAL HIGH (ref 0.44–1.00)
GFR, Estimated: 4 mL/min — ABNORMAL LOW (ref >60)
Glucose, Bld: 136 mg/dL — ABNORMAL HIGH (ref 70–99)
Potassium: 4.1 mmol/L (ref 3.5–5.1)
Sodium: 140 mmol/L (ref 135–145)
Total Bilirubin: 0.9 mg/dL (ref 0.3–1.2)
Total Protein: 7.9 g/dL (ref 6.5–8.1)

## 2020-11-08 LAB — CBC
HCT: 33.5 % — ABNORMAL LOW (ref 36.0–46.0)
Hemoglobin: 11 g/dL — ABNORMAL LOW (ref 12.0–15.0)
MCH: 29.6 pg (ref 26.0–34.0)
MCHC: 32.8 g/dL (ref 30.0–36.0)
MCV: 90.1 fL (ref 80.0–100.0)
Platelets: 287 10*3/uL (ref 150–400)
RBC: 3.72 MIL/uL — ABNORMAL LOW (ref 3.87–5.11)
RDW: 15.8 % — ABNORMAL HIGH (ref 11.5–15.5)
WBC: 6.8 10*3/uL (ref 4.0–10.5)
nRBC: 0 % (ref 0.0–0.2)

## 2020-11-08 LAB — SARS CORONAVIRUS 2 BY RT PCR (HOSPITAL ORDER, PERFORMED IN ~~LOC~~ HOSPITAL LAB): SARS Coronavirus 2: POSITIVE — AB

## 2020-11-08 LAB — LIPASE, BLOOD: Lipase: 21 U/L (ref 11–51)

## 2020-11-08 MED ORDER — ACETAMINOPHEN 650 MG RE SUPP: 650.0000 mg | Freq: Four times a day (QID) | RECTAL | Status: DC | PRN

## 2020-11-08 MED ORDER — DILTIAZEM HCL-DEXTROSE 125-5 MG/125ML-% IV SOLN (PREMIX): 5.0000 mg/h | INTRAVENOUS | Status: DC

## 2020-11-08 MED ORDER — ACETAMINOPHEN 325 MG PO TABS: 650.0000 mg | ORAL_TABLET | Freq: Four times a day (QID) | ORAL | Status: DC | PRN

## 2020-11-08 MED ORDER — DILTIAZEM HCL 25 MG/5ML IV SOLN
10.0000 mg | Freq: Once | INTRAVENOUS | Status: AC
  Administered 2020-11-08: 10 mg via INTRAVENOUS
  Filled 2020-11-08: qty 5

## 2020-11-08 MED ORDER — DILTIAZEM HCL-DEXTROSE 125-5 MG/125ML-% IV SOLN (PREMIX)
5.0000 mg/h | INTRAVENOUS | Status: DC
  Administered 2020-11-08: 5 mg/h via INTRAVENOUS
  Filled 2020-11-08: qty 125

## 2020-11-08 MED ORDER — HEPARIN SODIUM (PORCINE) 5000 UNIT/ML IJ SOLN
5000.0000 [IU] | Freq: Three times a day (TID) | INTRAMUSCULAR | Status: DC
  Administered 2020-11-08 – 2020-11-09 (×2): 5000 [IU] via SUBCUTANEOUS
  Filled 2020-11-08 (×2): qty 1

## 2020-11-08 MED ORDER — FENTANYL CITRATE (PF) 100 MCG/2ML IJ SOLN
25.0000 ug | Freq: Once | INTRAMUSCULAR | Status: AC
  Administered 2020-11-08: 25 ug via INTRAVENOUS
  Filled 2020-11-08: qty 2

## 2020-11-08 MED ORDER — IOHEXOL 300 MG/ML  SOLN
80.0000 mL | Freq: Once | INTRAMUSCULAR | Status: AC | PRN
  Administered 2020-11-08: 80 mL via INTRAVENOUS

## 2020-11-08 MED ORDER — ONDANSETRON HCL 4 MG/2ML IJ SOLN
4.0000 mg | Freq: Once | INTRAMUSCULAR | Status: AC
  Administered 2020-11-08: 4 mg via INTRAVENOUS
  Filled 2020-11-08: qty 2

## 2020-11-08 MED ORDER — METOPROLOL TARTRATE 5 MG/5ML IV SOLN: 5.0000 mg | Freq: Once | INTRAVENOUS | Status: DC

## 2020-11-08 MED ORDER — DILTIAZEM LOAD VIA INFUSION
10.0000 mg | Freq: Once | INTRAVENOUS | Status: DC
  Filled 2020-11-08 (×3): qty 10

## 2020-11-08 MED ORDER — FENTANYL CITRATE (PF) 100 MCG/2ML IJ SOLN: 12.5000 ug | INTRAMUSCULAR | Status: DC | PRN

## 2020-11-08 MED ORDER — PROCHLORPERAZINE EDISYLATE 10 MG/2ML IJ SOLN
5.0000 mg | Freq: Four times a day (QID) | INTRAMUSCULAR | Status: DC | PRN
  Administered 2020-11-13: 5 mg via INTRAVENOUS
  Filled 2020-11-08 (×4): qty 1

## 2020-11-08 NOTE — ED Provider Notes (Signed)
[facility] EMERGENCY DEPARTMENT Provider Note   CSN: [id number] Arrival date & time: 11/08/20  1208     History Chief Complaint  Patient presents with  . Nausea    [name] is a 60 y.o. female presenting for evaluation of nausea and abdominal pain.  Patient states 2 days ago she developed abdominal pain.  Is mostly in her mid upper abdomen.  She has associated nausea and vomiting.  She states she is feeling very weak.  Unable to tolerate p.o.  She was going to dialysis today, but notes he cannot go due to how poor she was feeling, and taken to the ER.  She does go to dialysis Tuesday, Thursday, Saturday, last one on Tuesday.  She denies fevers, chills, chest pain, shortness of breath, abnormal urination or bowel movements.  She is not vaccinated for COVID.  She denies sick contacts. She has not taken her medications for a fib today.   Initial visit into chart 3.  Patient with a history of CAD, heart failure, ESRD on dialysis, hypertension, hyperlipidemia, subarachnoid hemorrhage, PAF not on anticoagulation due to ICH/SAH.  HPI     Past Medical History:  Diagnosis Date  . Anterior cerebral artery aneurysm    1996 AT [facility]  . Coronary atherosclerosis of native coronary artery    STEMI Feb 2009 - 70% distal LAD lesion c/w plaque rupture, DES distal RCA  . Diastolic heart failure    LVEF 60-65% with grade 2 diastolic dysfunction - July 2014  . End-stage renal disease on hemodialysis ([place]) 02/03/2019  . ESRD (end stage renal disease) on dialysis ([place])   . HTN (hypertension)    Resistant  . Hypercholesterolemia   . Subarachnoid hemorrhage ([place])    03/2012  . Tobacco abuse    Resumed smoking half a pack a day. She was a smoker in the past and states she was abl eto stop in the past using nicotine patches  . Unspecified atrial fibrillation ([place]) 02/03/2019    Patient Active Problem List   Diagnosis Date Noted  . NSTEMI (non-ST elevated myocardial infarction)  ([place]) 10/16/2020  . AMS (altered mental status) 10/06/2020  . Elevated troponin 10/06/2020  . Headache, unspecified 11/28/2019  . Hypercalcemia 11/10/2019  . Allergy, unspecified, initial encounter 08/08/2019  . Hypokalemia 07/07/2019  . Sepsis, unspecified organism ([place]) 02/03/2019  . End-stage renal disease on hemodialysis ([place]) 02/03/2019  . Unspecified atrial fibrillation ([place]) 02/03/2019  . Hypotension 02/02/2019  . Syncope and collapse   . Palliative care encounter   . Atrial fibrillation with rapid ventricular response ([place]) 03/20/2018  . Chills (without fever) 03/16/2018  . Iron deficiency anemia, unspecified 02/04/2018  . 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 Anemia in chronic kidney disease 12/30/2017  . Fever, unspecified 12/30/2017  . Generalized abdominal pain 12/30/2017  . Heart failure, unspecified ([place]) 12/30/2017  . Other specified personal risk factors, not elsewhere classified 12/30/2017  . Pure hypercholesterolemia, unspecified 12/30/2017  . Secondary hyperparathyroidism of renal origin ([place]) 12/30/2017  . Shortness of breath 12/30/2017  . History of embolic stroke [id number]  . Altered mental status 11/08/2013  . HTN (hypertension) 11/08/2013  . Hypertensive renal disease 11/06/2013  . Protein-calorie malnutrition, severe ([place]) 11/04/2013  . Seizure disorder ([place]) 10/22/2013  . Hypertensive urgency 10/20/2013  . Hypertensive heart disease 05/07/2013  . Acute-on-chronic renal failure ([place]) 05/07/2013  . HTN (hypertension), malignant 05/06/2013  . CAD (coronary artery disease) 05/06/2013  . ESRD (end stage renal disease) ([place]) 05/06/2013  .  Chronic diastolic heart failure ([place])   . Tobacco abuse 09/25/2012  . H/O noncompliance with medical treatment, presenting hazards to health   . Renal artery stenosis ([place]) 04/05/2012  . History of subarachnoid hemorrhage 03/03/2012  . Mixed hyperlipidemia 09/19/2008  . Acute on chronic diastolic heart failure ([place])  09/19/2008    Past Surgical History:  Procedure Laterality Date  . ABDOMINAL HYSTERECTOMY    . BASCILIC VEIN TRANSPOSITION Right 05/13/2013   Procedure: [place];  Surgeon: Rosetta Posner, MD;  Location: [facility];  Service: Vascular;  Laterality: Right;  . BRAIN SURGERY     2   . INSERTION OF DIALYSIS CATHETER Right 10/15/2020   Procedure: INSERTION OF TUNNELED DIALYSIS CATHETER;  Surgeon: Cherre Robins, MD;  Location: [facility];  Service: Vascular;  Laterality: Right;  . LOOP RECORDER IMPLANT N/A 11/14/2013   Procedure: LOOP RECORDER IMPLANT;  Surgeon: Evans Lance, MD;  Location: [facility] CATH LAB;  Service: Cardiovascular;  Laterality: N/A;  . REVISON OF ARTERIOVENOUS FISTULA Right 10/15/2020   Procedure: REVISON OF ARTERIOVENOUS FISTULA ARM;  Surgeon: Cherre Robins, MD;  Location: [facility] OR;  Service: Vascular;  Laterality: Right;  . TEE WITHOUT CARDIOVERSION N/A 11/14/2013   Procedure: TRANSESOPHAGEAL ECHOCARDIOGRAM (TEE);  Surgeon: Candee Furbish, MD;  Location: [facility] ENDOSCOPY;  Service: Cardiovascular;  Laterality: N/A;  . VENTRICULOPERITONEAL SHUNT  03/27/2012   Procedure: SHUNT INSERTION VENTRICULAR-PERITONEAL;  Surgeon: Winfield Cunas, MD;  Location: [facility] NEURO ORS;  Service: Neurosurgery;  Laterality: N/A;  Ventricular-Peritoneal Shunt Insertion     OB History   No obstetric history on file.     Family History  Problem Relation Age of Onset  . Heart disease Mother   . Heart disease Father   . Coronary artery disease Other        Premature in 1st degree relatives    Social History   Tobacco Use  . Smoking status: Former Smoker    Packs/day: 0.20    Years: 35.00    Pack years: 7.00    Types: Cigarettes    Quit date: 03/20/2017    Years since quitting: 3.6  . Smokeless tobacco: Never Used  . Tobacco comment: 1/2 ppd   Vaping Use  . Vaping Use: Never used  Substance Use Topics  . Alcohol use: No  . Drug use: No    Home Medications Prior to Admission medications    Medication Sig Start Date End Date Taking? Authorizing Provider  amiodarone (PACERONE) 200 MG tablet Take 2 tabs ('400mg'$ ) daily for 7 days, then 1 tab ('200mg'$ ) daily thereafter 03/24/18   Rai, Vernelle Emerald, MD  aspirin 81 MG EC tablet Take by mouth. 12/31/17   [provider]  calcitRIOL (ROCALTROL) 0.5 MCG capsule Take 1 capsule (0.5 mcg total) by mouth Every Tuesday,Thursday,and Saturday with dialysis. 02/05/19   Hongalgi, Lenis Dickinson, MD  calcium acetate (PHOSLO) 667 MG capsule Take 667 mg by mouth 3 (three) times daily. 11/28/19   [provider]  iron sucrose in sodium chloride 0.9 % 100 mL Iron Sucrose (Venofer) 11/24/19 11/22/20  [provider]  lactulose (CHRONULAC) 10 GM/15ML solution Take 10 g by mouth daily as needed for moderate constipation.     [provider]  levETIRAcetam (KEPPRA) 500 MG tablet Take 500 mg by mouth 2 (two) times daily.    [provider]  metoprolol tartrate (LOPRESSOR) 25 MG tablet Take 1 tablet (25 mg total) by mouth 2 (two) times daily. 10/16/20  Annita Brod, MD  multivitamin (RENA-VIT) TABS tablet Take 1 tablet by mouth daily.    [provider]  nitroGLYCERIN (NITROSTAT) 0.4 MG SL tablet Place 1 tablet (0.4 mg total) under the tongue every 5 (five) minutes as needed for chest pain. 05/18/13   Janece Canterbury, MD  omeprazole (PRILOSEC) 20 MG capsule Take 20 mg by mouth daily.    [provider]  sucroferric oxyhydroxide (VELPHORO) 500 MG chewable tablet Chew 2 tablets (1,000 mg total) by mouth 3 (three) times daily with meals. Patient taking differently: Chew 500-1,500 mg by mouth See admin instructions. Crush or chew and swallow 3 tablets ('1500mg'$ ) by mouth three times daily with meals and 1 tablet ('500mg'$ ) by mouth twice daily with snacks. 02/04/19   Hongalgi, Lenis Dickinson, MD    Allergies    Amoxicillin and Penicillins  Review of Systems   Review of Systems  Gastrointestinal: Positive for abdominal pain,  nausea and vomiting.  All other systems reviewed and are negative.   Physical Exam Updated Vital Signs BP (!) 158/82   Pulse (!) 113   Temp 98.6 F (37 C) (Oral)   Resp (!) 27   SpO2 100%   Physical Exam Vitals and nursing note reviewed.  Constitutional:      General: She is not in acute distress.    Appearance: She is well-developed and well-nourished.     Comments: Appears chronically ill  HENT:     Head: Normocephalic and atraumatic.  Eyes:     Extraocular Movements: Extraocular movements intact and EOM normal.     Conjunctiva/sclera: Conjunctivae normal.     Pupils: Pupils are equal, round, and reactive to light.  Cardiovascular:     Rate and Rhythm: Tachycardia present. Rhythm irregular.     Pulses: Normal pulses and intact distal pulses.     Comments: Irregularly irregular, heart rate around 150 Pulmonary:     Effort: No respiratory distress.     Breath sounds: Normal breath sounds. No wheezing.     Comments: Clear lung sounds Abdominal:     General: There is no distension.     Palpations: Abdomen is soft. There is no mass.     Tenderness: There is abdominal tenderness. There is no guarding or rebound.     Comments: Tenderness palpation of epigastric and suprapubic abdomen. No rigidity, guarding, distention.  Musculoskeletal:        General: Normal range of motion.     Cervical back: Normal range of motion and neck supple.  Skin:    General: Skin is warm and dry.     Capillary Refill: Capillary refill takes less than 2 seconds.  Neurological:     Mental Status: She is alert and oriented to person, place, and time.  Psychiatric:        Mood and Affect: Mood and affect normal.     ED Results / Procedures / Treatments   Labs (all labs ordered are listed, but only abnormal results are displayed) Labs Reviewed  CBC - Abnormal; Notable for the following components:      Result Value   RBC 3.72 (*)    Hemoglobin 11.0 (*)    HCT 33.5 (*)    RDW 15.8 (*)     All other components within normal limits  COMPREHENSIVE METABOLIC PANEL - Abnormal; Notable for the following components:   Chloride 91 (*)    CO2 21 (*)    Glucose, Bld 136 (*)    BUN 53 (*)  Creatinine, Ser 9.53 (*)    Albumin 3.3 (*)    GFR, Estimated 4 (*)    Anion gap 28 (*)    All other components within normal limits  SARS CORONAVIRUS 2 BY RT PCR (HOSPITAL ORDER, [facility])  LIPASE, BLOOD  URINALYSIS, ROUTINE W REFLEX MICROSCOPIC    EKG EKG Interpretation  Date/Time:  Thursday November 08 2020 18:32:43 EST Ventricular Rate:  154 PR Interval:    QRS Duration: 104 QT Interval:  310 QTC Calculation: 500 R Axis:   -23 Text Interpretation: Atrial fibrillation LVH with secondary repolarization abnormality Inferior infarct, old Anterior infarct, old Lateral leads are also involved Confirmed by Lennice Sites [phone]) on 11/08/2020 7:28:10 PM   Radiology DG Chest 2 View  Result Date: 11/08/2020 CLINICAL DATA:  Nausea x1 day. EXAM: CHEST - 2 VIEW COMPARISON:  October 15, 2020 FINDINGS: The lateral views limited in evaluation secondary to positioning of the patient's upper extremities. There is stable right-sided venous catheter positioning. Tubing from a ventriculoperitoneal shunt is also seen. Mild atelectasis and/or infiltrate is seen within the retrocardiac region of the left lung base. There is no evidence of a pleural effusion or pneumothorax. The cardiac silhouette is mildly enlarged. Marked severity calcification of the thoracic aorta is seen. The visualized skeletal structures are unremarkable. IMPRESSION: Mild left basilar atelectasis and/or infiltrate. Electronically Signed   By: Virgina Norfolk M.D.   On: 11/08/2020 19:52   CT ABDOMEN PELVIS W CONTRAST  Result Date: 11/08/2020 CLINICAL DATA:  Epigastric pain EXAM: CT ABDOMEN AND PELVIS WITH CONTRAST TECHNIQUE: Multidetector CT imaging of the abdomen and pelvis was performed using the  standard protocol following bolus administration of intravenous contrast. CONTRAST:  67m OMNIPAQUE IOHEXOL 300 MG/ML  SOLN COMPARISON:  None. FINDINGS: Lower chest: The visualized heart size within normal limits. No pericardial fluid/thickening. No hiatal hernia. A small left pleural effusion is seen. Patchy airspace opacity seen at the left lung base. Hepatobiliary: Multiple low-density lesions are seen throughout the liver parenchyma the largest measuring 1.2 cm in the superior right liver lobe.The main portal vein is patent. No evidence of calcified gallstones, gallbladder wall thickening or biliary dilatation. Pancreas: There is mild pancreatic ductal dilatation measuring up to 2 mm. This is nonspecific however. Spleen: Normal in size without focal abnormality. Adrenals/Urinary Tract: Both adrenal glands appear normal. Mild bilateral renal atrophy seen. There is scattered calcification the largest measuring 3 mm in the midpole of the right kidney. No hydronephrosis. Bladder is unremarkable. Stomach/Bowel: The stomach, small bowel, are normal in appearance. There is scattered colonic diverticula. There is a moderately distended rectal fecal ball. Vascular/Lymphatic: There are no enlarged mesenteric, retroperitoneal, or pelvic lymph nodes. Scattered aortic atherosclerotic calcifications are seen without aneurysmal dilatation. Reproductive: The patient is status post hysterectomy. No adnexal masses or collections seen. Other: No evidence of abdominal wall mass or hernia. Musculoskeletal: No acute or significant osseous findings. IMPRESSION: Small left pleural effusion with patchy airspace opacity at the left lung base which may be due to atelectasis and/or infectious etiology Nonspecific mild pancreatic ductal dilatation Diverticulosis without diverticulitis Moderately dilated rectum with a fecal ball Aortic Atherosclerosis (ICD10-I70.0). Electronically Signed   By: BPrudencio PairM.D.   On: 11/08/2020 20:34     Procedures .Critical Care Performed by: CFranchot Heidelberg PA-C Authorized by: CFranchot Heidelberg PA-C   Critical care provider statement:    Critical care time (minutes):  45   Critical care time was exclusive of:  Separately billable  procedures and treating other patients and teaching time   Critical care was necessary to treat or prevent imminent or life-threatening deterioration of the following conditions:  Cardiac failure   Critical care was time spent personally by me on the following activities:  Blood draw for specimens, development of treatment plan with patient or surrogate, evaluation of patient's response to treatment, examination of patient, obtaining history from patient or surrogate, ordering and performing treatments and interventions, ordering and review of laboratory studies, pulse oximetry, ordering and review of radiographic studies, re-evaluation of patient's condition and review of old charts   I assumed direction of critical care for this patient from another provider in my specialty: no     Care discussed with: admitting provider   Comments:     Patient in A. fib with RVR, eventually started on dilt drip and requiring admission.   (including critical care time)  Medications Ordered in ED Medications  diltiazem (CARDIZEM) 125 mg in dextrose 5% 125 mL (1 mg/mL) infusion (has no administration in time range)  diltiazem (CARDIZEM) injection 10 mg (10 mg Intravenous Given 11/08/20 1953)  ondansetron (ZOFRAN) injection 4 mg (4 mg Intravenous Given 11/08/20 2004)  fentaNYL (SUBLIMAZE) injection 25 mcg (25 mcg Intravenous Given 11/08/20 2004)  iohexol (OMNIPAQUE) 300 MG/ML solution 80 mL (80 mLs Intravenous Contrast Given 11/08/20 2024)    ED Course  I have reviewed the triage vital signs and the nursing notes.  Pertinent labs & imaging results that were available during my care of the patient were reviewed by me and considered in my medical decision making (see chart  for details).    MDM Rules/Calculators/A&P                          Patient presented for evaluation nausea, vomiting, abdominal pain and weakness. On exam, patient appears chronically ill. She is in A. fib with RVR. I saw patient approximately 7 hours after arrival to the ER. Labs obtained from triage similar to patient's baseline. As she is in A. fib with RVR, obtain chest x-ray. Will add on lipase due to epigastric pain. Will add on CT abdomen pelvis due to abdominal symptoms. Built mood given.  Lipase negative. Chest x-ray viewed interpreted by me, shows opacity left lower lung, consider infiltrate versus atelectasis. As she is afebrile and without cough, will test for COVID. However will hold off on bacterial pneumonia treatment.  CT abdomen pelvis negative for acute findings. Once again demonstrates a left lower lobe abnormality. Patient's heart rate improved with adult load, however she still remains in A. fib around 120. Infusion started. Patient will need to be admitted.  Discussed with Dr. Marlowe Sax from [facility] hospitalist service, patient to be admitted   Adiela Goodluck was evaluated in Emergency Department on 11/08/2020 for the symptoms described in the history of present illness. She was evaluated in the context of the global COVID-19 pandemic, which necessitated consideration that the patient might be at risk for infection with the SARS-CoV-2 virus that causes COVID-19. Institutional protocols and algorithms that pertain to the evaluation of patients at risk for COVID-19 are in a state of rapid change based on information released by regulatory bodies including the CDC and federal and state organizations. These policies and algorithms were followed during the patient's care in the ED.  Final Clinical Impression(s) / ED Diagnoses Final diagnoses:  Atrial fibrillation with RVR (HCC)  Nausea and vomiting, intractability of vomiting not specified, unspecified vomiting type  Epigastric  abdominal pain    Rx / DC Orders ED Discharge Orders    None       Franchot Heidelberg, PA-C 11/08/20 2104    Lennice Sites, DO 11/08/20 2130

## 2020-11-08 NOTE — ED Notes (Signed)
Pharmacy called for medication

## 2020-11-08 NOTE — ED Triage Notes (Signed)
Pt reports she is here today due to N&V x 4 days.Pt reports is feel weak and unable to tolerate p.o

## 2020-11-08 NOTE — H&P (Signed)
History and Physical    [name] [id number] DOB: April 13, 1961 DOA: 11/08/2020  PCP: Clovia Cuff, MD Patient coming from: Home  Chief Complaint: Nausea, vomiting, abdominal pain  HPI: [name] is a 60 y.o. female with medical history significant of ESRD on HD TTS, CAD, hypertension, hyperlipidemia, paroxysmal A. 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  Patient is a poor historian and able to provide limited history.  Reports 1 week history of nausea, vomiting, and epigastric abdominal pain.  She is not sure when she goes for dialysis.  Denies fevers, chills, cough, shortness of breath, or chest pain.  She is not vaccinated against COVID.  ED Course: Afebrile.  In A. fib with RVR with rate up to 150s.  Not hypotensive.  WBC 6.8, hemoglobin 11.0 (at baseline), platelet count 287K.  Sodium 140, potassium 4.1, chloride 91, bicarb 21, BUN 53, creatinine 9.5, glucose 136.  Lipase and LFTs normal.  SARS-CoV-2 PCR test pending.  Chest x-ray showing mild left basilar atelectasis and/or infiltrate.  CT abdomen pelvis showing nonspecific mild pancreatic ductal dilation but no other acute findings which would explain patient's epigastric abdominal pain, nausea, and vomiting.  Showing small left pleural effusion with patchy airspace opacity at the left lung base which could be due to atelectasis and/or infectious etiology. Patient was given IV Cardizem 10 mg and started on continuous infusion.  She was given Zofran for nausea and fentanyl for pain.  Review of Systems:  All systems reviewed and apart from history of presenting illness, are negative.  Past Medical History:  Diagnosis Date  . Anterior cerebral artery aneurysm    1996 AT [facility]  . Coronary atherosclerosis of native coronary artery    STEMI Feb  2009 - 70% distal LAD lesion c/w plaque rupture, DES distal RCA  . Diastolic heart failure    LVEF 60-65% with grade 2 diastolic dysfunction - July 2014  . End-stage renal disease on hemodialysis ([place]) 02/03/2019  . ESRD (end stage renal disease) on dialysis ([place])   . HTN (hypertension)    Resistant  . Hypercholesterolemia   . Subarachnoid hemorrhage ([place])    03/2012  . Tobacco abuse    Resumed smoking half a pack a day. She was a smoker in the past and states she was abl eto stop in the past using nicotine patches  . Unspecified atrial fibrillation ([place]) 02/03/2019    Past Surgical History:  Procedure Laterality Date  . ABDOMINAL HYSTERECTOMY    . BASCILIC VEIN TRANSPOSITION Right 05/13/2013   Procedure: [place];  Surgeon: Rosetta Posner, MD;  Location: [place];  Service: Vascular;  Laterality: Right;  . BRAIN SURGERY     2   . INSERTION OF DIALYSIS CATHETER Right 10/15/2020   Procedure: INSERTION OF TUNNELED DIALYSIS CATHETER;  Surgeon: Cherre Robins, MD;  Location: [place];  Service: Vascular;  Laterality: Right;  . LOOP RECORDER IMPLANT N/A 11/14/2013   Procedure: LOOP RECORDER IMPLANT;  Surgeon: Evans Lance, MD;  Location: [facility] LAB;  Service: Cardiovascular;  Laterality: N/A;  . REVISON OF ARTERIOVENOUS FISTULA Right 10/15/2020   Procedure: REVISON OF ARTERIOVENOUS FISTULA ARM;  Surgeon: Cherre Robins, MD;  Location: [facility] OR;  Service: Vascular;  Laterality: Right;  . TEE WITHOUT CARDIOVERSION N/A 11/14/2013   Procedure: TRANSESOPHAGEAL ECHOCARDIOGRAM (TEE);  Surgeon:  Candee Furbish, MD;  Location: [place];  Service: Cardiovascular;  Laterality: N/A;  . VENTRICULOPERITONEAL SHUNT  03/27/2012   Procedure: SHUNT INSERTION VENTRICULAR-PERITONEAL;  Surgeon: Winfield Cunas, MD;  Location: [place] NEURO ORS;  Service: Neurosurgery;  Laterality: N/A;  Ventricular-Peritoneal Shunt Insertion     reports that she quit smoking about 3 years ago. Her smoking use included  cigarettes. She has a 7.00 pack-year smoking history. She has never used smokeless tobacco. She reports that she does not drink alcohol and does not use drugs.  Allergies  Allergen Reactions  . Amoxicillin     Informed by pt's daughter [name]   . Penicillins Hives and Rash    Has patient had a PCN reaction causing immediate rash, facial/tongue/throat swelling, SOB or lightheadedness with hypotension: Yes Has patient had a PCN reaction causing severe rash involving mucus membranes or skin necrosis: No Has patient had a PCN reaction that required hospitalization: No Has patient had a PCN reaction occurring within the last 10 years: No If all of the above answers are "NO", then may proceed with Cephalosporin use.     Family History  Problem Relation Age of Onset  . Heart disease Mother   . Heart disease Father   . Coronary artery disease Other        Premature in 1st degree relatives    Prior to Admission medications   Medication Sig Start Date End Date Taking? Authorizing Provider  amiodarone (PACERONE) 200 MG tablet Take 2 tabs ('400mg'$ ) daily for 7 days, then 1 tab ('200mg'$ ) daily thereafter 03/24/18   Rai, Vernelle Emerald, MD  aspirin 81 MG EC tablet Take by mouth. 12/31/17   [provider]  calcitRIOL (ROCALTROL) 0.5 MCG capsule Take 1 capsule (0.5 mcg total) by mouth Every Tuesday,Thursday,and Saturday with dialysis. 02/05/19   Hongalgi, Lenis Dickinson, MD  calcium acetate (PHOSLO) 667 MG capsule Take 667 mg by mouth 3 (three) times daily. 11/28/19   [provider]  iron sucrose in sodium chloride 0.9 % 100 mL Iron Sucrose (Venofer) 11/24/19 11/22/20  [provider]  lactulose (CHRONULAC) 10 GM/15ML solution Take 10 g by mouth daily as needed for moderate constipation.     [provider]  levETIRAcetam (KEPPRA) 500 MG tablet Take 500 mg by mouth 2 (two) times daily.    [provider]  metoprolol tartrate (LOPRESSOR) 25 MG tablet Take 1 tablet (25 mg  total) by mouth 2 (two) times daily. 10/16/20   Annita Brod, MD  multivitamin (RENA-VIT) TABS tablet Take 1 tablet by mouth daily.    [provider]  nitroGLYCERIN (NITROSTAT) 0.4 MG SL tablet Place 1 tablet (0.4 mg total) under the tongue every 5 (five) minutes as needed for chest pain. 05/18/13   Janece Canterbury, MD  omeprazole (PRILOSEC) 20 MG capsule Take 20 mg by mouth daily.    [provider]  sucroferric oxyhydroxide (VELPHORO) 500 MG chewable tablet Chew 2 tablets (1,000 mg total) by mouth 3 (three) times daily with meals. Patient taking differently: Chew 500-1,500 mg by mouth See admin instructions. Crush or chew and swallow 3 tablets ('1500mg'$ ) by mouth three times daily with meals and 1 tablet ('500mg'$ ) by mouth twice daily with snacks. 02/04/19   Modena Jansky, MD    Physical Exam: Vitals:   11/08/20 1955 11/08/20 2000 11/08/20 2146 11/08/20 2214  BP: (!) 142/85 (!) 158/82 (!) 155/94 (!) 146/97  Pulse: (!) 112 (!) 113 (!) 116 (!) 135  Resp: Marland Kitchen)  29 (!) 27 20 (!) 21  Temp:      TempSrc:      SpO2: 95% 100% 100% 99%    Physical Exam Constitutional:      General: She is not in acute distress. HENT:     Head: Normocephalic and atraumatic.  Eyes:     Extraocular Movements: Extraocular movements intact.     Conjunctiva/sclera: Conjunctivae normal.  Cardiovascular:     Rate and Rhythm: Normal rate and regular rhythm.     Pulses: Normal pulses.  Pulmonary:     Effort: Pulmonary effort is normal. No respiratory distress.     Breath sounds: No wheezing or rales.  Abdominal:     General: Bowel sounds are normal. There is no distension.     Palpations: Abdomen is soft.     Tenderness: There is abdominal tenderness. There is no guarding or rebound.     Comments: Generalized tenderness to palpation  Musculoskeletal:        General: No swelling or tenderness.     Cervical back: Normal range of motion and neck supple.  Skin:    General: Skin is warm and  dry.  Neurological:     Mental Status: She is alert and oriented to person, place, and time.     Labs on Admission: I have personally reviewed following labs and imaging studies  CBC: Recent Labs  Lab 11/08/20 1404  WBC 6.8  HGB 11.0*  HCT 33.5*  MCV 90.1  PLT [id number]   Basic Metabolic Panel: Recent Labs  Lab 11/08/20 1404  NA 140  K 4.1  CL 91*  CO2 21*  GLUCOSE 136*  BUN 53*  CREATININE 9.53*  CALCIUM 9.5   GFR: Estimated Creatinine Clearance: 4.1 mL/min (A) (by C-G formula based on SCr of 9.53 mg/dL (H)). Liver Function Tests: Recent Labs  Lab 11/08/20 1404  AST 38  ALT 18  ALKPHOS 61  BILITOT 0.9  PROT 7.9  ALBUMIN 3.3*   Recent Labs  Lab 11/08/20 1822  LIPASE 21   No results for input(s): AMMONIA in the last 168 hours. Coagulation Profile: No results for input(s): INR, PROTIME in the last 168 hours. Cardiac Enzymes: No results for input(s): CKTOTAL, CKMB, CKMBINDEX, TROPONINI in the last 168 hours. BNP (last 3 results) No results for input(s): PROBNP in the last 8760 hours. HbA1C: No results for input(s): HGBA1C in the last 72 hours. CBG: No results for input(s): GLUCAP in the last 168 hours. Lipid Profile: No results for input(s): CHOL, HDL, LDLCALC, TRIG, CHOLHDL, LDLDIRECT in the last 72 hours. Thyroid Function Tests: No results for input(s): TSH, T4TOTAL, FREET4, T3FREE, THYROIDAB in the last 72 hours. Anemia Panel: No results for input(s): VITAMINB12, FOLATE, FERRITIN, TIBC, IRON, RETICCTPCT in the last 72 hours. Urine analysis:    Component Value Date/Time   COLORURINE YELLOW 11/08/2013 [place] 11/08/2013 1304   LABSPEC 1.009 11/08/2013 1304   PHURINE 7.5 11/08/2013 1304   GLUCOSEU NEGATIVE 11/08/2013 1304   HGBUR NEGATIVE 11/08/2013 1304   BILIRUBINUR NEGATIVE 11/08/2013 1304   KETONESUR NEGATIVE 11/08/2013 1304   PROTEINUR NEGATIVE 11/08/2013 1304   UROBILINOGEN 0.2 11/08/2013 1304   NITRITE NEGATIVE 11/08/2013  1304   LEUKOCYTESUR NEGATIVE 11/08/2013 1304    Radiological Exams on Admission: DG Chest 2 View  Result Date: 11/08/2020 CLINICAL DATA:  Nausea x1 day. EXAM: CHEST - 2 VIEW COMPARISON:  October 15, 2020 FINDINGS: The lateral views limited in evaluation secondary to positioning of the  patient's upper extremities. There is stable right-sided venous catheter positioning. Tubing from a ventriculoperitoneal shunt is also seen. Mild atelectasis and/or infiltrate is seen within the retrocardiac region of the left lung base. There is no evidence of a pleural effusion or pneumothorax. The cardiac silhouette is mildly enlarged. Marked severity calcification of the thoracic aorta is seen. The visualized skeletal structures are unremarkable. IMPRESSION: Mild left basilar atelectasis and/or infiltrate. Electronically Signed   By: Virgina Norfolk M.D.   On: 11/08/2020 19:52   CT ABDOMEN PELVIS W CONTRAST  Result Date: 11/08/2020 CLINICAL DATA:  Epigastric pain EXAM: CT ABDOMEN AND PELVIS WITH CONTRAST TECHNIQUE: Multidetector CT imaging of the abdomen and pelvis was performed using the standard protocol following bolus administration of intravenous contrast. CONTRAST:  42m OMNIPAQUE IOHEXOL 300 MG/ML  SOLN COMPARISON:  None. FINDINGS: Lower chest: The visualized heart size within normal limits. No pericardial fluid/thickening. No hiatal hernia. A small left pleural effusion is seen. Patchy airspace opacity seen at the left lung base. Hepatobiliary: Multiple low-density lesions are seen throughout the liver parenchyma the largest measuring 1.2 cm in the superior right liver lobe.The main portal vein is patent. No evidence of calcified gallstones, gallbladder wall thickening or biliary dilatation. Pancreas: There is mild pancreatic ductal dilatation measuring up to 2 mm. This is nonspecific however. Spleen: Normal in size without focal abnormality. Adrenals/Urinary Tract: Both adrenal glands appear normal. Mild  bilateral renal atrophy seen. There is scattered calcification the largest measuring 3 mm in the midpole of the right kidney. No hydronephrosis. Bladder is unremarkable. Stomach/Bowel: The stomach, small bowel, are normal in appearance. There is scattered colonic diverticula. There is a moderately distended rectal fecal ball. Vascular/Lymphatic: There are no enlarged mesenteric, retroperitoneal, or pelvic lymph nodes. Scattered aortic atherosclerotic calcifications are seen without aneurysmal dilatation. Reproductive: The patient is status post hysterectomy. No adnexal masses or collections seen. Other: No evidence of abdominal wall mass or hernia. Musculoskeletal: No acute or significant osseous findings. IMPRESSION: Small left pleural effusion with patchy airspace opacity at the left lung base which may be due to atelectasis and/or infectious etiology Nonspecific mild pancreatic ductal dilatation Diverticulosis without diverticulitis Moderately dilated rectum with a fecal ball Aortic Atherosclerosis (ICD10-I70.0). Electronically Signed   By: BPrudencio PairM.D.   On: 11/08/2020 20:34    EKG: Independently reviewed.  A. fib with RVR, QTC 500.  Assessment/Plan Principal Problem:   Atrial fibrillation with rapid ventricular response (HCC) Active Problems:   CAD (coronary artery disease)   Seizure disorder (HCC)   End-stage renal disease on hemodialysis (HCC)   Epigastric abdominal pain   A. fib with RVR: Rate up to 150s initially, now improved after patient was started on Cardizem infusion.  She is not on chronic anticoagulation due to history of cerebral aneurysm and subarachnoid hemorrhage in 2013. -Cardiac monitoring.  Continue Cardizem infusion.  Resume home amiodarone after pharmacy med rec is done.  Epigastric abdominal pain, nausea, vomiting: Lipase and LFTs normal.  CT showing nonspecific mild pancreatic ductal dilation but no other acute findings which would explain the patient's symptoms.   Symptoms could possibly be due to viral gastroenteritis.  She is not vaccinated against COVID. -SARS-CoV-2 PCR test pending, continue airborne and contact precautions.  Compazine as needed for nausea/vomiting.  Fentanyl as needed for pain.  ESRD on HD TTS: Unclear when she last went for dialysis.  Potassium 4.1, bicarb 21.  Chest x-ray shows showing small left pleural effusion.  No hypoxia or signs of respiratory distress.  Does not appear volume overloaded on exam. -Consult nephrology in the morning.  QT prolongation EKG -Cardiac monitoring.  Monitor potassium and magnesium levels.  Avoid QT prolonging drugs if possible.  ?PNA: CT showing patchy airspace opacity at the left lung base which could be due to atelectasis and/or infectious etiology.  Pneumonia less likely given no fever or leukocytosis.  Patient is not hypoxic and not endorsing any respiratory symptoms. -Check procalcitonin level, if elevated, start antibiotics.  Patient is unvaccinated and COVID test pending, continue airborne and contact precautions.  If COVID test positive, treat with remdesivir and check inflammatory markers.  Hold steroids given no hypoxia.  CAD -Resume home aspirin after pharmacy med rec is done.  No statin listed in home medications, pharmacy med rec pending.  Hold home metoprolol as patient is currently on Cardizem infusion.  Seizure disorder -Resume home antiepileptic medication after pharmacy med rec is done.  DVT prophylaxis: Subcutaneous heparin Code Status: Full code Family Communication: No family available at this time. Disposition Plan: Status is: Observation  The patient remains OBS appropriate and will d/c before 2 midnights.  Dispo: The patient is from: Home              Anticipated d/c is to: Home              Anticipated d/c date is: 2 days              Patient currently is not medically stable to d/c.  The medical decision making on this patient was of high complexity and the patient  is at high risk for clinical deterioration, therefore this is a level 3 visit.  Shela Leff MD [facility]  If 7PM-7AM, please contact night-coverage [email or web address]  11/08/2020, 10:42 PM

## 2020-11-08 NOTE — ED Notes (Signed)
Patient transported to CT 

## 2020-11-09 ENCOUNTER — Observation Stay: Payer: Self-pay

## 2020-11-09 ENCOUNTER — Observation Stay (HOSPITAL_COMMUNITY): Payer: 59

## 2020-11-09 ENCOUNTER — Inpatient Hospital Stay (HOSPITAL_COMMUNITY): Payer: 59

## 2020-11-09 DIAGNOSIS — R7989 Other specified abnormal findings of blood chemistry: Secondary | ICD-10-CM | POA: Diagnosis not present

## 2020-11-09 DIAGNOSIS — I480 Paroxysmal atrial fibrillation: Secondary | ICD-10-CM | POA: Diagnosis present

## 2020-11-09 DIAGNOSIS — S52552A Other extraarticular fracture of lower end of left radius, initial encounter for closed fracture: Secondary | ICD-10-CM | POA: Diagnosis present

## 2020-11-09 DIAGNOSIS — Z681 Body mass index (BMI) 19 or less, adult: Secondary | ICD-10-CM | POA: Diagnosis not present

## 2020-11-09 DIAGNOSIS — S52615A Nondisplaced fracture of left ulna styloid process, initial encounter for closed fracture: Secondary | ICD-10-CM | POA: Diagnosis present

## 2020-11-09 DIAGNOSIS — W1830XA Fall on same level, unspecified, initial encounter: Secondary | ICD-10-CM | POA: Diagnosis present

## 2020-11-09 DIAGNOSIS — R778 Other specified abnormalities of plasma proteins: Secondary | ICD-10-CM | POA: Diagnosis not present

## 2020-11-09 DIAGNOSIS — N186 End stage renal disease: Secondary | ICD-10-CM | POA: Diagnosis present

## 2020-11-09 DIAGNOSIS — U071 COVID-19: Secondary | ICD-10-CM | POA: Diagnosis present

## 2020-11-09 DIAGNOSIS — R1084 Generalized abdominal pain: Secondary | ICD-10-CM | POA: Diagnosis not present

## 2020-11-09 DIAGNOSIS — J1282 Pneumonia due to coronavirus disease 2019: Secondary | ICD-10-CM | POA: Diagnosis present

## 2020-11-09 DIAGNOSIS — I132 Hypertensive heart and chronic kidney disease with heart failure and with stage 5 chronic kidney disease, or end stage renal disease: Secondary | ICD-10-CM | POA: Diagnosis present

## 2020-11-09 DIAGNOSIS — R112 Nausea with vomiting, unspecified: Secondary | ICD-10-CM

## 2020-11-09 DIAGNOSIS — R0603 Acute respiratory distress: Secondary | ICD-10-CM | POA: Diagnosis not present

## 2020-11-09 DIAGNOSIS — I9589 Other hypotension: Secondary | ICD-10-CM | POA: Diagnosis present

## 2020-11-09 DIAGNOSIS — I4891 Unspecified atrial fibrillation: Secondary | ICD-10-CM

## 2020-11-09 DIAGNOSIS — R1013 Epigastric pain: Secondary | ICD-10-CM | POA: Diagnosis present

## 2020-11-09 DIAGNOSIS — R571 Hypovolemic shock: Secondary | ICD-10-CM | POA: Diagnosis present

## 2020-11-09 DIAGNOSIS — I2510 Atherosclerotic heart disease of native coronary artery without angina pectoris: Secondary | ICD-10-CM | POA: Diagnosis present

## 2020-11-09 DIAGNOSIS — E440 Moderate protein-calorie malnutrition: Secondary | ICD-10-CM | POA: Diagnosis present

## 2020-11-09 DIAGNOSIS — Z515 Encounter for palliative care: Secondary | ICD-10-CM | POA: Diagnosis not present

## 2020-11-09 DIAGNOSIS — N2581 Secondary hyperparathyroidism of renal origin: Secondary | ICD-10-CM | POA: Diagnosis present

## 2020-11-09 DIAGNOSIS — Z8616 Personal history of COVID-19: Secondary | ICD-10-CM | POA: Diagnosis not present

## 2020-11-09 DIAGNOSIS — I82411 Acute embolism and thrombosis of right femoral vein: Secondary | ICD-10-CM | POA: Diagnosis present

## 2020-11-09 DIAGNOSIS — I5032 Chronic diastolic (congestive) heart failure: Secondary | ICD-10-CM | POA: Diagnosis present

## 2020-11-09 DIAGNOSIS — K5641 Fecal impaction: Secondary | ICD-10-CM | POA: Diagnosis present

## 2020-11-09 DIAGNOSIS — D62 Acute posthemorrhagic anemia: Secondary | ICD-10-CM

## 2020-11-09 DIAGNOSIS — I82409 Acute embolism and thrombosis of unspecified deep veins of unspecified lower extremity: Secondary | ICD-10-CM | POA: Diagnosis not present

## 2020-11-09 DIAGNOSIS — G9341 Metabolic encephalopathy: Secondary | ICD-10-CM | POA: Diagnosis present

## 2020-11-09 DIAGNOSIS — E785 Hyperlipidemia, unspecified: Secondary | ICD-10-CM | POA: Diagnosis present

## 2020-11-09 DIAGNOSIS — R627 Adult failure to thrive: Secondary | ICD-10-CM | POA: Diagnosis present

## 2020-11-09 DIAGNOSIS — G911 Obstructive hydrocephalus: Secondary | ICD-10-CM | POA: Diagnosis present

## 2020-11-09 DIAGNOSIS — I252 Old myocardial infarction: Secondary | ICD-10-CM | POA: Diagnosis not present

## 2020-11-09 DIAGNOSIS — R64 Cachexia: Secondary | ICD-10-CM | POA: Diagnosis present

## 2020-11-09 LAB — HEMOGLOBIN AND HEMATOCRIT, BLOOD
HCT: 20 % — ABNORMAL LOW (ref 36.0–46.0)
HCT: 31.8 % — ABNORMAL LOW (ref 36.0–46.0)
Hemoglobin: 6.5 g/dL — CL (ref 12.0–15.0)
Hemoglobin: 10.7 g/dL — ABNORMAL LOW (ref 12.0–15.0)

## 2020-11-09 LAB — CBC
HCT: 20.6 % — ABNORMAL LOW (ref 36.0–46.0)
Hemoglobin: 6.8 g/dL — CL (ref 12.0–15.0)
MCH: 29.1 pg (ref 26.0–34.0)
MCHC: 33 g/dL (ref 30.0–36.0)
MCV: 88 fL (ref 80.0–100.0)
Platelets: 255 10*3/uL (ref 150–400)
RBC: 2.34 MIL/uL — ABNORMAL LOW (ref 3.87–5.11)
RDW: 15.8 % — ABNORMAL HIGH (ref 11.5–15.5)
WBC: 5.9 10*3/uL (ref 4.0–10.5)
nRBC: 0 % (ref 0.0–0.2)

## 2020-11-09 LAB — PROCALCITONIN
Procalcitonin: 14.27 ng/mL
Procalcitonin: 29.06 ng/mL

## 2020-11-09 LAB — BASIC METABOLIC PANEL
Anion gap: 22 — ABNORMAL HIGH (ref 5–15)
BUN: 71 mg/dL — ABNORMAL HIGH (ref 6–20)
CO2: 24 mmol/L (ref 22–32)
Calcium: 8.8 mg/dL — ABNORMAL LOW (ref 8.9–10.3)
Chloride: 88 mmol/L — ABNORMAL LOW (ref 98–111)
Creatinine, Ser: 10.36 mg/dL — ABNORMAL HIGH (ref 0.44–1.00)
GFR, Estimated: 4 mL/min — ABNORMAL LOW (ref >60)
Glucose, Bld: 98 mg/dL (ref 70–99)
Potassium: 4.1 mmol/L (ref 3.5–5.1)
Sodium: 134 mmol/L — ABNORMAL LOW (ref 135–145)

## 2020-11-09 LAB — TRIGLYCERIDES: Triglycerides: 112 mg/dL (ref <150)

## 2020-11-09 LAB — C-REACTIVE PROTEIN
CRP: 23.2 mg/dL — ABNORMAL HIGH (ref <1.0)
CRP: 23.2 mg/dL — ABNORMAL HIGH (ref <1.0)

## 2020-11-09 LAB — LACTATE DEHYDROGENASE: LDH: 258 U/L — ABNORMAL HIGH (ref 98–192)

## 2020-11-09 LAB — D-DIMER, QUANTITATIVE
D-Dimer, Quant: 1.91 ug/mL-FEU — ABNORMAL HIGH (ref 0.00–0.50)
D-Dimer, Quant: 2.21 ug/mL-FEU — ABNORMAL HIGH (ref 0.00–0.50)

## 2020-11-09 LAB — DIC (DISSEMINATED INTRAVASCULAR COAGULATION)PANEL
D-Dimer, Quant: 1.92 ug/mL-FEU — ABNORMAL HIGH (ref 0.00–0.50)
Fibrinogen: 601 mg/dL — ABNORMAL HIGH (ref 210–475)
INR: 1.3 — ABNORMAL HIGH (ref 0.8–1.2)
Platelets: 249 10*3/uL (ref 150–400)
Prothrombin Time: 16.1 seconds — ABNORMAL HIGH (ref 11.4–15.2)
Smear Review: NONE SEEN
aPTT: 39 seconds — ABNORMAL HIGH (ref 24–36)

## 2020-11-09 LAB — FERRITIN: Ferritin: 3062 ng/mL — ABNORMAL HIGH (ref 11–307)

## 2020-11-09 LAB — PREPARE RBC (CROSSMATCH)

## 2020-11-09 LAB — HEPATIC FUNCTION PANEL
ALT: 18 U/L (ref 0–44)
AST: 30 U/L (ref 15–41)
Albumin: 2.6 g/dL — ABNORMAL LOW (ref 3.5–5.0)
Alkaline Phosphatase: 55 U/L (ref 38–126)
Bilirubin, Direct: 0.1 mg/dL (ref 0.0–0.2)
Indirect Bilirubin: 0.6 mg/dL (ref 0.3–0.9)
Total Bilirubin: 0.7 mg/dL (ref 0.3–1.2)
Total Protein: 5.9 g/dL — ABNORMAL LOW (ref 6.5–8.1)

## 2020-11-09 LAB — MAGNESIUM
Magnesium: 2.6 mg/dL — ABNORMAL HIGH (ref 1.7–2.4)
Magnesium: 2.5 mg/dL — ABNORMAL HIGH (ref 1.7–2.4)

## 2020-11-09 LAB — FIBRINOGEN: Fibrinogen: 633 mg/dL — ABNORMAL HIGH (ref 210–475)

## 2020-11-09 LAB — LIPASE, BLOOD: Lipase: 22 U/L (ref 11–51)

## 2020-11-09 LAB — ABO/RH: ABO/RH(D): A POS

## 2020-11-09 LAB — GLUCOSE, CAPILLARY: Glucose-Capillary: 152 mg/dL — ABNORMAL HIGH (ref 70–99)

## 2020-11-09 LAB — BRAIN NATRIURETIC PEPTIDE: B Natriuretic Peptide: 3445.5 pg/mL — ABNORMAL HIGH (ref 0.0–100.0)

## 2020-11-09 MED ORDER — LEVOFLOXACIN IN D5W 500 MG/100ML IV SOLN: 500.0000 mg | INTRAVENOUS | Status: DC

## 2020-11-09 MED ORDER — MORPHINE SULFATE (PF) 4 MG/ML IV SOLN
4.0000 mg | Freq: Once | INTRAVENOUS | Status: AC
  Administered 2020-11-09: 4 mg via INTRAVENOUS
  Filled 2020-11-09: qty 1

## 2020-11-09 MED ORDER — PANTOPRAZOLE SODIUM 40 MG IV SOLR
40.0000 mg | Freq: Two times a day (BID) | INTRAVENOUS | Status: DC
  Administered 2020-11-09 – 2020-11-15 (×14): 40 mg via INTRAVENOUS
  Filled 2020-11-09 (×15): qty 40

## 2020-11-09 MED ORDER — SODIUM CHLORIDE 0.9 % IV BOLUS
250.0000 mL | Freq: Once | INTRAVENOUS | Status: AC
  Administered 2020-11-09: 250 mL via INTRAVENOUS

## 2020-11-09 MED ORDER — DIPHENHYDRAMINE HCL 50 MG/ML IJ SOLN: 25.0000 mg | Freq: Four times a day (QID) | INTRAMUSCULAR | Status: DC | PRN

## 2020-11-09 MED ORDER — ASPIRIN 81 MG PO CHEW
81.0000 mg | CHEWABLE_TABLET | Freq: Every day | ORAL | Status: DC
  Administered 2020-11-09: 81 mg via ORAL
  Filled 2020-11-09: qty 1

## 2020-11-09 MED ORDER — LEVETIRACETAM ER 500 MG PO TB24
500.0000 mg | ORAL_TABLET | Freq: Every day | ORAL | Status: DC
  Filled 2020-11-09: qty 1

## 2020-11-09 MED ORDER — SODIUM CHLORIDE 0.9 % IV SOLN
100.0000 mg | Freq: Every day | INTRAVENOUS | Status: AC
  Administered 2020-11-10 – 2020-11-13 (×4): 100 mg via INTRAVENOUS
  Filled 2020-11-09 (×4): qty 20

## 2020-11-09 MED ORDER — DILTIAZEM HCL 60 MG PO TABS
60.0000 mg | ORAL_TABLET | Freq: Three times a day (TID) | ORAL | Status: DC
  Administered 2020-11-09: 60 mg via ORAL
  Filled 2020-11-09: qty 1

## 2020-11-09 MED ORDER — SODIUM CHLORIDE 0.9 % IV SOLN
200.0000 mg | Freq: Once | INTRAVENOUS | Status: AC
  Administered 2020-11-09: 200 mg via INTRAVENOUS
  Filled 2020-11-09: qty 40

## 2020-11-09 MED ORDER — DILTIAZEM HCL 25 MG/5ML IV SOLN
10.0000 mg | Freq: Four times a day (QID) | INTRAVENOUS | Status: DC | PRN
  Administered 2020-11-09: 10 mg via INTRAVENOUS
  Filled 2020-11-09 (×2): qty 5

## 2020-11-09 MED ORDER — BISACODYL 10 MG RE SUPP
10.0000 mg | Freq: Every day | RECTAL | Status: DC
  Filled 2020-11-09: qty 1

## 2020-11-09 MED ORDER — LEVOFLOXACIN IN D5W 750 MG/150ML IV SOLN
750.0000 mg | Freq: Once | INTRAVENOUS | Status: AC
  Administered 2020-11-09: 750 mg via INTRAVENOUS
  Filled 2020-11-09: qty 150

## 2020-11-09 MED ORDER — SODIUM CHLORIDE 0.9% IV SOLUTION: Freq: Once | INTRAVENOUS | Status: AC

## 2020-11-09 MED ORDER — CHLORHEXIDINE GLUCONATE CLOTH 2 % EX PADS
6.0000 | MEDICATED_PAD | Freq: Every day | CUTANEOUS | Status: DC
  Administered 2020-11-09 – 2020-11-11 (×3): 6 via TOPICAL

## 2020-11-09 MED ORDER — CHLORHEXIDINE GLUCONATE CLOTH 2 % EX PADS
6.0000 | MEDICATED_PAD | Freq: Every day | CUTANEOUS | Status: DC
  Administered 2020-11-11 – 2020-11-26 (×13): 6 via TOPICAL

## 2020-11-09 MED ORDER — DARBEPOETIN ALFA 100 MCG/0.5ML IJ SOSY
100.0000 ug | PREFILLED_SYRINGE | INTRAMUSCULAR | Status: DC
  Administered 2020-11-10: 100 ug via SUBCUTANEOUS
  Filled 2020-11-09: qty 0.5

## 2020-11-09 MED ORDER — LEVETIRACETAM IN NACL 500 MG/100ML IV SOLN
500.0000 mg | Freq: Two times a day (BID) | INTRAVENOUS | Status: DC
  Administered 2020-11-09 – 2020-11-15 (×12): 500 mg via INTRAVENOUS
  Filled 2020-11-09 (×13): qty 100

## 2020-11-09 MED ORDER — CALCITRIOL 0.5 MCG PO CAPS
1.0000 ug | ORAL_CAPSULE | ORAL | Status: DC
  Administered 2020-11-10 – 2020-11-27 (×7): 1 ug via ORAL
  Filled 2020-11-09: qty 4
  Filled 2020-11-09: qty 2
  Filled 2020-11-09 (×2): qty 4
  Filled 2020-11-09 (×2): qty 2
  Filled 2020-11-09: qty 4
  Filled 2020-11-09 (×2): qty 2
  Filled 2020-11-09: qty 4

## 2020-11-09 NOTE — Progress Notes (Signed)
PT Cancellation Note  Patient Details Name: [name] MRN: [id number] DOB: [birth year]   Cancelled Treatment:    Reason Eval/Treat Not Completed: (P) Medical issues which prohibited therapy Pt Hgb is 6.8 this morning and RN request therapy be deferred today. PT will follow back for Evaluation tomorrow.   Tate Jerkins B. Migdalia Dk PT, DPT Acute Rehabilitation Services Pager [phone] Office [phone]  [place] 11/09/2020, 10:03 AM

## 2020-11-09 NOTE — Progress Notes (Addendum)
Assisted Dr Ander Slade with Right Femoral Central Line placement.  Patient tolerated well.  Site oozy.  Dressing applied.  BP taken in Right and Left Leg with good correlation.  PIV in Left Upper arm.  AV graft Right upper arm.  See Flowsheet for VS

## 2020-11-09 NOTE — Progress Notes (Signed)
IVT consult note:  Seen patient. Alert;awake; R arm restricted. L U arm w/ IVF infusing via PIV. LLarm w/ bandage and patient moaning in pain when touched; stated she broke her arm. RN at bedside as well. RN will speak to MD for assessment for  Possible central access . Encouraged to call or consult again if needing assistance.

## 2020-11-09 NOTE — Progress Notes (Signed)
PROGRESS NOTE                                                                                                                                                                                                             Patient Demographics:    [name], is a 60 y.o. female, DOB - 1960/10/28, [id number]:[id number]  Outpatient Primary MD for the patient is Clovia Cuff, MD    LOS - 0  Admit date - 11/08/2020    Chief Complaint  Patient presents with   Nausea       Brief Narrative (HPI from H&P)  [name] is a 60 y.o. female with medical history significant of ESRD on HD TTS, CAD, hypertension, hyperlipidemia, paroxysmal A. 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  She has not been vaccinated for COVID-19, in the ER she was found to be in A. fib with RVR, skipped dialysis treatment, questionable COVID-pneumonia, soon she developed worsening abdominal pain and a rapid drop in her H&H.  Currently nephrology, GI, critical care have been consulted.  Patient is extremely tenuous.   Subjective:    [name] today has, No headache, No chest pain, is having abdominal pain in the periumbilical area, no nausea vomiting, no shortness of breath or focal weakness   Assessment  & Plan :     1. Acute COVID-19 infection with possible early pneumonia in a patient who is not vaccinated - she is currently not hypoxic and this seems to be a secondary issue, will continue to monitor.  Encouraged the patient to sit up in chair in the daytime use I-S and flutter valve for pulmonary toiletry and then prone in bed when at night.  Will advance activity and titrate down oxygen as possible.  SpO2: 100 %  Recent Labs  Lab 11/08/20 1404 11/08/20 1814  11/09/20 0007 11/09/20 0806  WBC 6.8  --   --  5.9  HGB 11.0*  --   --  6.8*  HCT 33.5*  --   --  20.6*  PLT 287  --   --  255  CRP  --   --  23.2*  --   BNP  --   --   --  3,445.5*  DDIMER  --   --  1.91* 2.21*  PROCALCITON 14.27  --   --   --   AST 38  --   --  30  ALT 18  --   --  18  ALKPHOS 61  --   --  55  BILITOT 0.9  --   --  0.7  ALBUMIN 3.3*  --   --  2.6*  SARSCOV2NAA  --  POSITIVE*  --   --     2. Epigastric abdominal pain ongoing for 12 hours with a drop in H&H.  On IV PPI, CT scan done few hours ago was nonacute, KUB this morning does not show any free air under the diaphragm.  Her abdomen is tender on exam, GI is following, will repeat CT scan to rule out retroperitoneal bleed.  3. acute on chronic anemia.  Baseline anemia of chronic disease due to ESRD, acute drop unexplained, repeat H&H, repeat CT scan once she is hemodynamically stable to rule out retroperitoneal bleed due to ongoing GI discomfort, 2 units of type cross and transfuse on 11/09/2020.  Check DIC panel.  4. ESRD.  On TTS schedule, missed dialysis on 11/08/2020.  Renal consulted.  5. left wrist pain and fracture.  Etiology unknown, per daughter she was fine before she went to the dialysis unit on 11/08/2020 but then started complaining of left wrist pain after a few minutes and was brought to the ER.  No known injury or fall.  Ortho has been consulted currently in splint.  6. lack of IV access.  PCCM consulted for line placement.  7. history of seizures.  On Keppra.  8. A. fib RVR.  Has underlying paroxysmal A. fib, not on anticoagulation due to previous history of subarachnoid hemorrhage, [place] vas 2 score of greater than 3.  Currently on IV and oral Cardizem as needed.  Aspirin once she is stable.  9. generalized weakness.  Supportive care.  PT OT.        Condition - Extremely Guarded  Family Communication  : daughter [name]  [phone] - 11/09/20   Code Status :  Full  Consults  :  Renal,  PCCM, GI  Procedures  :    CT - Small left pleural effusion with patchy airspace opacity at the left lung base which may be due to atelectasis and/or infectious etiology Nonspecific mild pancreatic ductal dilatation Diverticulosis without diverticulitis Moderately dilated rectum with a fecal ball Aortic Atherosclerosis   PUD Prophylaxis : IV PPI  Disposition Plan  :    Status is: Inpt  Dispo: The patient is from: Home              Anticipated d/c is to: Home              Anticipated d/c date is: > 3 days              Patient currently is not medically stable to d/c.  DVT Prophylaxis  : SCDs   Lab Results  Component Value Date   PLT 255 11/09/2020    Diet :  Diet Order            Diet NPO time specified Except for: Sips with Meds  Diet effective now                  Inpatient Medications  Scheduled Meds:  sodium chloride   Intravenous Once   bisacodyl  10 mg Rectal Daily   Chlorhexidine  Gluconate Cloth  6 each Topical Daily   diltiazem  60 mg Oral Q8H   levETIRAcetam  500 mg Oral Daily   pantoprazole (PROTONIX) IV  40 mg Intravenous Q12H   Continuous Infusions:  [START ON 11/11/2020] levofloxacin (LEVAQUIN) IV     levofloxacin (LEVAQUIN) IV     remdesivir 200 mg in sodium chloride 0.9% 250 mL IVPB 200 mg (11/09/20 1016)   Followed by   Derrill Memo ON 11/10/2020] remdesivir 100 mg in NS 100 mL     PRN Meds:.[DISCONTINUED] acetaminophen **OR** acetaminophen, diltiazem, diphenhydrAMINE, fentaNYL (SUBLIMAZE) injection, prochlorperazine  Antibiotics  :    Anti-infectives (From admission, onward)   Start     Dose/Rate Route Frequency Ordered Stop   11/11/20 0600  levofloxacin (LEVAQUIN) IVPB 500 mg        500 mg 100 mL/hr over 60 Minutes Intravenous Every 48 hours 11/09/20 0413     11/10/20 1000  remdesivir 100 mg in sodium chloride 0.9 % 100 mL IVPB       "Followed by" Linked Group Details   100 mg 200 mL/hr over 30 Minutes Intravenous Daily 11/09/20 0023  11/14/20 0959   11/09/20 0500  levofloxacin (LEVAQUIN) IVPB 750 mg        750 mg 100 mL/hr over 90 Minutes Intravenous  Once 11/09/20 0413     11/09/20 0100  remdesivir 200 mg in sodium chloride 0.9% 250 mL IVPB       "Followed by" Linked Group Details   200 mg 580 mL/hr over 30 Minutes Intravenous Once 11/09/20 0023         Time Spent in minutes  30   Lala Lund M.D on 11/09/2020 at 10:31 AM  To page go to [email or web address]   [facility] -  Office  [phone]  See all Orders from today for further details    Objective:   Vitals:   11/09/20 0115 11/09/20 0340 11/09/20 0751 11/09/20 0822  BP: 137/75 135/75 (!) 92/51 117/63  Pulse: 90 91 68 77  Resp: (!) '26 18 19 20  '$ Temp:  97.6 F (36.4 C) (!) 97.5 F (36.4 C)   TempSrc:  Oral Oral   SpO2: 100% 98% 100% 100%    Wt Readings from Last 3 Encounters:  10/26/20 40.7 kg  10/17/20 42.5 kg  12/06/19 46.9 kg    No intake or output data in the 24 hours ending 11/09/20 1031   Physical Exam  Awake somewhat confused, appears extremely weak and cachectic,  [place].AT,PERRAL Supple Neck,No JVD, No cervical lymphadenopathy appriciated.  Symmetrical Chest wall movement, Good air movement bilaterally, CTAB RRR,No Gallops,Rubs or new Murmurs, No Parasternal Heave +ve B.Sounds, abdomen is tender to palpate  left arm in splint    Data Review:    CBC Recent Labs  Lab 11/08/20 1404 11/09/20 0806  WBC 6.8 5.9  HGB 11.0* 6.8*  HCT 33.5* 20.6*  PLT 287 255  MCV 90.1 88.0  MCH 29.6 29.1  MCHC 32.8 33.0  RDW 15.8* 15.8*    Recent Labs  Lab 11/08/20 1404 11/09/20 0007 11/09/20 0806  NA 140  --  134*  K 4.1  --  4.1  CL 91*  --  88*  CO2 21*  --  24  GLUCOSE 136*  --  98  BUN 53*  --  71*  CREATININE 9.53*  --  10.36*  CALCIUM 9.5  --  8.8*  AST 38  --  30  ALT 18  --  18  ALKPHOS  61  --  55  BILITOT 0.9  --  0.7  ALBUMIN 3.3*  --  2.6*  MG 2.6*  --  2.5*  CRP  --  23.2*  --   DDIMER  --  1.91* 2.21*   PROCALCITON 14.27  --   --   BNP  --   --  3,445.5*    ------------------------------------------------------------------------------------------------------------------ Recent Labs    11/09/20 0007  TRIG 112    Lab Results  Component Value Date   HGBA1C 5.2 10/07/2020   ------------------------------------------------------------------------------------------------------------------ No results for input(s): TSH, T4TOTAL, T3FREE, THYROIDAB in the last 72 hours.  Invalid input(s): FREET3  Cardiac Enzymes No results for input(s): CKMB, TROPONINI, MYOGLOBIN in the last 168 hours.  Invalid input(s): CK ------------------------------------------------------------------------------------------------------------------    Component Value Date/Time   BNP 3,445.5 (H) 11/09/2020 [id number]    Micro Results Recent Results (from the past 240 hour(s))  SARS Coronavirus 2 by RT PCR (hospital order, performed in [facility] hospital lab) Nasopharyngeal Nasopharyngeal Swab     Status: Abnormal   Collection Time: 11/08/20  6:14 PM   Specimen: Nasopharyngeal Swab  Result Value Ref Range Status   SARS Coronavirus 2 POSITIVE (A) NEGATIVE Final    Comment: RESULT CALLED TO, READ BACK BY AND VERIFIED WITH: Diannia Ruder RN 11/08/20 2359 JDW (NOTE) SARS-CoV-2 target nucleic acids are DETECTED  SARS-CoV-2 RNA is generally detectable in upper respiratory specimens  during the acute phase of infection.  Positive results are indicative  of the presence of the identified virus, but do not rule out bacterial infection or co-infection with other pathogens not detected by the test.  Clinical correlation with patient history and  other diagnostic information is necessary to determine patient infection status.  The expected result is negative.  Fact Sheet for Patients:   StrictlyIdeas.no   Fact Sheet for Healthcare Providers:   BankingDealers.co.za    This  test is not yet approved or cleared by the [place] FDA and  has been authorized for detection and/or diagnosis of SARS-CoV-2 by FDA under an Emergency Use Authorization (EUA).  This EUA will remain in effect (meaning this test can  be used) for the duration of  the COVID-19 declaration under Section 564(b)(1) of the Act, 21 U.S.C. section 360-bbb-3(b)(1), unless the authorization is terminated or revoked sooner.  Performed at [facility], [place] [address] [place], [place] [address]     Radiology Reports DG Chest 2 View  Result Date: 11/08/2020 CLINICAL DATA:  Nausea x1 day. EXAM: CHEST - 2 VIEW COMPARISON:  October 15, 2020 FINDINGS: The lateral views limited in evaluation secondary to positioning of the patient's upper extremities. There is stable right-sided venous catheter positioning. Tubing from a ventriculoperitoneal shunt is also seen. Mild atelectasis and/or infiltrate is seen within the retrocardiac region of the left lung base. There is no evidence of a pleural effusion or pneumothorax. The cardiac silhouette is mildly enlarged. Marked severity calcification of the thoracic aorta is seen. The visualized skeletal structures are unremarkable. IMPRESSION: Mild left basilar atelectasis and/or infiltrate. Electronically Signed   By: Virgina Norfolk M.D.   On: 11/08/2020 19:52   DG Wrist 2 Views Left  Result Date: 11/09/2020 CLINICAL DATA:  Fall EXAM: LEFT WRIST - 2 VIEW COMPARISON:  None. FINDINGS: There is a comminuted impacted distal radius fracture with slight posterior angulation. There is a non displaced ulnar styloid fracture. Overlying soft tissue swelling is seen. IMPRESSION: Comminuted impacted distal radius fracture. Nondisplaced ulnar styloid fracture. Electronically  Signed   By: Prudencio Pair M.D.   On: 11/09/2020 02:22   CT ABDOMEN PELVIS W CONTRAST  Result Date: 11/08/2020 CLINICAL DATA:  Epigastric pain EXAM: CT ABDOMEN AND PELVIS WITH CONTRAST TECHNIQUE:  Multidetector CT imaging of the abdomen and pelvis was performed using the standard protocol following bolus administration of intravenous contrast. CONTRAST:  31m OMNIPAQUE IOHEXOL 300 MG/ML  SOLN COMPARISON:  None. FINDINGS: Lower chest: The visualized heart size within normal limits. No pericardial fluid/thickening. No hiatal hernia. A small left pleural effusion is seen. Patchy airspace opacity seen at the left lung base. Hepatobiliary: Multiple low-density lesions are seen throughout the liver parenchyma the largest measuring 1.2 cm in the superior right liver lobe.The main portal vein is patent. No evidence of calcified gallstones, gallbladder wall thickening or biliary dilatation. Pancreas: There is mild pancreatic ductal dilatation measuring up to 2 mm. This is nonspecific however. Spleen: Normal in size without focal abnormality. Adrenals/Urinary Tract: Both adrenal glands appear normal. Mild bilateral renal atrophy seen. There is scattered calcification the largest measuring 3 mm in the midpole of the right kidney. No hydronephrosis. Bladder is unremarkable. Stomach/Bowel: The stomach, small bowel, are normal in appearance. There is scattered colonic diverticula. There is a moderately distended rectal fecal ball. Vascular/Lymphatic: There are no enlarged mesenteric, retroperitoneal, or pelvic lymph nodes. Scattered aortic atherosclerotic calcifications are seen without aneurysmal dilatation. Reproductive: The patient is status post hysterectomy. No adnexal masses or collections seen. Other: No evidence of abdominal wall mass or hernia. Musculoskeletal: No acute or significant osseous findings. IMPRESSION: Small left pleural effusion with patchy airspace opacity at the left lung base which may be due to atelectasis and/or infectious etiology Nonspecific mild pancreatic ductal dilatation Diverticulosis without diverticulitis Moderately dilated rectum with a fecal ball Aortic Atherosclerosis (ICD10-I70.0).  Electronically Signed   By: BPrudencio PairM.D.   On: 11/08/2020 20:34   DG Chest Port 1 View  Result Date: 11/09/2020 CLINICAL DATA:  Shortness of breath EXAM: PORTABLE CHEST 1 VIEW COMPARISON:  November 08, 2020 FINDINGS: Central catheter tip is at the cavoatrial junction. There is a intraperitoneal shunt catheter extending along the medial right hemithorax, stable. There is ill-defined airspace opacity in the left lower lobe. Lungs elsewhere clear. Heart is mildly enlarged with pulmonary venous hypertension. There is aortic atherosclerosis. No adenopathy. There is a loop recorder on the left. No bone lesions. IMPRESSION: Central catheter position unchanged. No pneumothorax. Airspace opacity likely representing pneumonia left lower lobe. Lungs elsewhere clear. There is mild cardiomegaly with a degree of pulmonary vascular congestion. Aortic Atherosclerosis (ICD10-I70.0). Electronically Signed   By: WLowella GripIII M.D.   On: 11/09/2020 08:37   DG Chest Port 1 View  Result Date: 10/15/2020 CLINICAL DATA:  Post dialysis catheter placement EXAM: PORTABLE CHEST 1 VIEW COMPARISON:  10/06/2020 FINDINGS: New right IJ tunneled dialysis catheter tip overlies the cavoatrial junction. Partially imaged shunt catheter traversing the right chest. No new consolidation. No pleural effusion or pneumothorax. Stable cardiomediastinal contours. IMPRESSION: New right IJ tunneled dialysis catheter tip overlies the cavoatrial junction. No pneumothorax. Electronically Signed   By: PMacy MisM.D.   On: 10/15/2020 13:22   DG Abd Portable 1V  Result Date: 11/09/2020 CLINICAL DATA:  Abdominal pain, anemia EXAM: PORTABLE ABDOMEN - 1 VIEW COMPARISON:  11/08/2020 CT abdomen/pelvis FINDINGS: No disproportionately dilated small bowel loops. Moderate rectal stool, similar. Mild to moderate colonic stool and gas. No evidence of pneumatosis or pneumoperitoneum. Right-sided VP shunt catheter terminates in the left  abdomen and  appears intact and without kink. No radiopaque nephrolithiasis. IMPRESSION: Nonobstructive bowel gas pattern. Moderate rectal stool, similar to recent CT. Mild to moderate colonic stool and gas. Electronically Signed   By: Ilona Sorrel M.D.   On: 11/09/2020 10:11   DG Fluoro Guide CV Line-No Report  Result Date: 10/15/2020 Fluoroscopy was utilized by the requesting physician.  No radiographic interpretation.   [place] EKG SITE RITE  Result Date: 11/09/2020 If Site Rite image not attached, placement could not be confirmed due to current cardiac rhythm.

## 2020-11-09 NOTE — Progress Notes (Signed)
Orthopedic Tech Progress Note Patient Details:  [name] 12-12-1960 [id number]  Ortho Devices Type of Ortho Device: Wrist splint Ortho Device/Splint Location: LUE Ortho Device/Splint Interventions: Application   Post Interventions Patient Tolerated: Fair Instructions Provided: Care of device   [name] 11/09/2020, 3:04 AM

## 2020-11-09 NOTE — Progress Notes (Signed)
CRITICAL VALUE ALERT  Critical Value: Hemoglobin 6.8  Date & Time Notied:  11/09/2020 0815  Provider Notified:Singh, Deno Etienne  MD  Orders Received/Actions taken: H & H repeat, 2 units if still below 8.

## 2020-11-09 NOTE — Plan of Care (Addendum)
Esther Russon [id number] [birth year]  OP HD Orders:  [facility] - TTS 3.75hrs 400/800 38kg 2K 2Ca TDC Calcitriol '120mg'$  qHD No Heparin mircera 76mg q2wks - last 102m on 08/23/20 Calcitriol 46m53mqHD  Velphoro 2 AC TID, 1 with snacks  Last HD 3hrs on 1/18.  Mostly compliant. Missed VVS post OP f/u - plication on [id number] MowA-C [facility] Pager: [phone]

## 2020-11-09 NOTE — Progress Notes (Signed)
Order for Bedside PICC placement noted. Notified MD and Primary RN that patient is a renal patient with an AVG in the R arm and a [place] in the R chest. That makes her an unsuitable candidate for bedside PICC placement by IV Team. If central access is desired, You could possibly get the RENAL MD to write an order for IV Team to use the R chest [place] temporarily. Awaiting new orders.

## 2020-11-09 NOTE — ED Notes (Signed)
X-ray at bedside

## 2020-11-09 NOTE — Consult Note (Addendum)
[facility] Consult: 10:30 AM 11/09/2020  LOS: 0 days    Referring Provider: Dr Candiss Norse Primary Care Physician:  Clovia Cuff, MD in [facility].   Primary Gastroenterologist:  unassigned    Reason for Consultation:  Anemia.     HPI: [name] is a 60 y.o. female.  History cerebral aneurysm, subarachnoid hemorrhage.  VP shunt.  Seizures.  Embolic CVA.  STEMI, diastolic heart failure.  A. fib.  ESRD on HD TTS.  Not on anticoagulation.. Did not find any GI history in record review.   Patient denies previous endoscopy or colonoscopy though its not clear her history is reliable.  Admitted yesterday complaining of abdominal pain in the upper abdomen,  Notes mention 1 week of nausea, vomiting and epigastric pain. She was in A. fib with RVR to 150s.  Hb 11, her baseline.  Good platelets.  Lipase, LFTs normal.  CXR with left basilar atelectasis versus infiltrate.  She tested COVID-19 positive and was apparently never vaccinated. CTAP with contrast:   Small effusion and patchy opacity in the left lung base.  Nonspecific mild PD dilation.  Diverticulosis without diverticulitis.  Dilated rectum containing fecal ball.  Started Cardizem drip. Hb has gone from 11 >> 6.8 this morning.  Her baseline is anywhere from 9-12. repeat Hb has just been drawn. Additionally this morning she complains of pain in her left arm and x-rays confirm a fracture in the distal radius and ulnar styloid.    Past Medical History:  Diagnosis Date   Anterior cerebral artery aneurysm    1996 AT [facility]   Coronary atherosclerosis of native coronary artery    STEMI Feb 2009 - 70% distal LAD lesion c/w plaque rupture, DES distal RCA   Diastolic heart failure    LVEF 60-65% with grade 2 diastolic dysfunction - July 2014   End-stage renal  disease on hemodialysis ([place]) 02/03/2019   ESRD (end stage renal disease) on dialysis ([place])    HTN (hypertension)    Resistant   Hypercholesterolemia    Subarachnoid hemorrhage ([place])    03/2012   Tobacco abuse    Resumed smoking half a pack a day. She was a smoker in the past and states she was abl eto stop in the past using nicotine patches   Unspecified atrial fibrillation ([place]) 02/03/2019    Past Surgical History:  Procedure Laterality Date   ABDOMINAL HYSTERECTOMY     BASCILIC VEIN TRANSPOSITION Right 05/13/2013   Procedure: [place];  Surgeon: Rosetta Posner, MD;  Location: [place];  Service: Vascular;  Laterality: Right;   BRAIN SURGERY     2    INSERTION OF DIALYSIS CATHETER Right 10/15/2020   Procedure: INSERTION OF TUNNELED DIALYSIS CATHETER;  Surgeon: Cherre Robins, MD;  Location: [place];  Service: Vascular;  Laterality: Right;   LOOP RECORDER IMPLANT N/A 11/14/2013   Procedure: LOOP RECORDER IMPLANT;  Surgeon: Evans Lance, MD;  Location: [facility] CATH LAB;  Service: Cardiovascular;  Laterality: N/A;   REVISON OF ARTERIOVENOUS FISTULA Right 10/15/2020   Procedure: REVISON  OF ARTERIOVENOUS FISTULA ARM;  Surgeon: Cherre Robins, MD;  Location: [facility] OR;  Service: Vascular;  Laterality: Right;   TEE WITHOUT CARDIOVERSION N/A 11/14/2013   Procedure: TRANSESOPHAGEAL ECHOCARDIOGRAM (TEE);  Surgeon: Candee Furbish, MD;  Location: [facility] ENDOSCOPY;  Service: Cardiovascular;  Laterality: N/A;   VENTRICULOPERITONEAL SHUNT  03/27/2012   Procedure: SHUNT INSERTION VENTRICULAR-PERITONEAL;  Surgeon: Winfield Cunas, MD;  Location: [place] NEURO ORS;  Service: Neurosurgery;  Laterality: N/A;  Ventricular-Peritoneal Shunt Insertion    Prior to Admission medications   Medication Sig Start Date End Date Taking? Authorizing Provider  amiodarone (PACERONE) 200 MG tablet Take 2 tabs ('400mg'$ ) daily for 7 days, then 1 tab ('200mg'$ ) daily thereafter 03/24/18   Rai, Vernelle Emerald, MD  aspirin 81 MG  EC tablet Take by mouth. 12/31/17   [provider]  calcitRIOL (ROCALTROL) 0.5 MCG capsule Take 1 capsule (0.5 mcg total) by mouth Every Tuesday,Thursday,and Saturday with dialysis. 02/05/19   Hongalgi, Lenis Dickinson, MD  calcium acetate (PHOSLO) 667 MG capsule Take 667 mg by mouth 3 (three) times daily. 11/28/19   [provider]  iron sucrose in sodium chloride 0.9 % 100 mL Iron Sucrose (Venofer) 11/24/19 11/22/20  [provider]  lactulose (CHRONULAC) 10 GM/15ML solution Take 10 g by mouth daily as needed for moderate constipation.     [provider]  levETIRAcetam (KEPPRA) 500 MG tablet Take 500 mg by mouth 2 (two) times daily.    [provider]  metoprolol tartrate (LOPRESSOR) 25 MG tablet Take 1 tablet (25 mg total) by mouth 2 (two) times daily. 10/16/20   Annita Brod, MD  multivitamin (RENA-VIT) TABS tablet Take 1 tablet by mouth daily.    [provider]  nitroGLYCERIN (NITROSTAT) 0.4 MG SL tablet Place 1 tablet (0.4 mg total) under the tongue every 5 (five) minutes as needed for chest pain. 05/18/13   Janece Canterbury, MD  omeprazole (PRILOSEC) 20 MG capsule Take 20 mg by mouth daily.    [provider]  sucroferric oxyhydroxide (VELPHORO) 500 MG chewable tablet Chew 2 tablets (1,000 mg total) by mouth 3 (three) times daily with meals. Patient taking differently: Chew 500-1,500 mg by mouth See admin instructions. Crush or chew and swallow 3 tablets ('1500mg'$ ) by mouth three times daily with meals and 1 tablet ('500mg'$ ) by mouth twice daily with snacks. 02/04/19   Modena Jansky, MD    Scheduled Meds:  sodium chloride   Intravenous Once   bisacodyl  10 mg Rectal Daily   Chlorhexidine Gluconate Cloth  6 each Topical Daily   diltiazem  60 mg Oral Q8H   levETIRAcetam  500 mg Oral Daily   pantoprazole (PROTONIX) IV  40 mg Intravenous Q12H   Infusions:  [START ON 11/11/2020] levofloxacin (LEVAQUIN) IV     levofloxacin  (LEVAQUIN) IV     remdesivir 200 mg in sodium chloride 0.9% 250 mL IVPB 200 mg (11/09/20 1016)   Followed by   Derrill Memo ON 11/10/2020] remdesivir 100 mg in NS 100 mL     PRN Meds: [DISCONTINUED] acetaminophen **OR** acetaminophen, diltiazem, diphenhydrAMINE, fentaNYL (SUBLIMAZE) injection, prochlorperazine   Allergies as of 11/08/2020 - Review Complete 11/08/2020  Allergen Reaction Noted   Amoxicillin  10/15/2020   Penicillins Hives and Rash     Family History  Problem Relation Age of Onset   Heart disease Mother    Heart disease Father    Coronary artery disease Other        Premature in 1st  degree relatives    Social History   Socioeconomic History   Marital status: Single    Spouse name: Not on file   Number of children: Not on file   Years of education: Not on file   Highest education level: Not on file  Occupational History   Not on file  Tobacco Use   Smoking status: Former Smoker    Packs/day: 0.20    Years: 35.00    Pack years: 7.00    Types: Cigarettes    Quit date: 03/20/2017    Years since quitting: 3.6   Smokeless tobacco: Never Used   Tobacco comment: 1/2 ppd   Vaping Use   Vaping Use: Never used  Substance and Sexual Activity   Alcohol use: No   Drug use: No   Sexual activity: Never  Other Topics Concern   Not on file  Social History Narrative   Lives by herself but helps care for her 8 grandchildren.    Social Determinants of Health   Financial Resource Strain: Medium Risk   Difficulty of Paying Living Expenses: Somewhat hard  Food Insecurity: No Food Insecurity   Worried About Charity fundraiser in the Last Year: Never true   Arboriculturist in the Last Year: Never true  Transportation Needs: Unmet Transportation Needs   Lack of Transportation (Medical): Yes   Lack of Transportation (Non-Medical): Yes  Physical Activity: Not on file  Stress: Not on file  Social Connections: Not on file  Intimate Partner Violence:  Not on file    REVIEW OF SYSTEMS: Patient cannot provide much in the way of a history so any details are noted in the HPI above. There is no history of unusual or excessive bleeding or bruising per review of the chart.   PHYSICAL EXAM: Vital signs in last 24 hours: Vitals:   11/09/20 0751 11/09/20 0822  BP: (!) 92/51 117/63  Pulse: 68 77  Resp: 19 20  Temp: (!) 97.5 F (36.4 C)   SpO2: 100% 100%   Wt Readings from Last 3 Encounters:  10/26/20 40.7 kg  10/17/20 42.5 kg  12/06/19 46.9 kg    General: Thin, malnourished, chronically ill, looks older than stated age.  Alert but poor historian Head: No facial asymmetry or swelling.  No signs of head trauma.  Given its/bumps on her skull consistent with previous cranial surgeries. Eyes: Positive for conjunctival pallor. Ears: No obvious hearing deficit Nose: No discharge Mouth: Slightly dry oral mucosa.  Patient was not able to follow the command to stick her tongue out but it appears midline in her mouth. Neck: No JVD, no masses, no thyromegaly Lungs: No increased work of breathing, no cough.  Lungs diminished but clear bilaterally. Heart: Tacky but regular, slight systolic murmur. Abdomen: Soft.  Tender mildly without guarding or rebound in the epigastrium and left upper quadrant.  No palpable masses, HSM, hernias.  Bowel sounds normal but hypoactive..   Rectal: Soft, light brown/mustard colored stool.  This was not hemocculted.  No blood.  No masses.  There is soft stool in the rectum. Musc/Skeltl: Thin legs and arms Extremities: No CCE.  Left forearm is Ace wrapped/splinted. Neurologic: Oriented to her name but not to place, situation or self.  Answers some simple questions but not always following simple commands. Skin: Several tattoos, no visualized open sores.   Psych: Flat affect.  Cooperative  Intake/Output from previous day: No intake/output data recorded. Intake/Output this shift: No intake/output data  recorded.  LAB RESULTS: Recent Labs    11/08/20 1404 11/09/20 0806  WBC 6.8 5.9  HGB 11.0* 6.8*  HCT 33.5* 20.6*  PLT 287 255   BMET Lab Results  Component Value Date   NA 134 (L) 11/09/2020   NA 140 11/08/2020   NA 134 (L) 10/16/2020   K 4.1 11/09/2020   K 4.1 11/08/2020   K 5.9 (H) 10/16/2020   CL 88 (L) 11/09/2020   CL 91 (L) 11/08/2020   CL 96 (L) 10/16/2020   CO2 24 11/09/2020   CO2 21 (L) 11/08/2020   CO2 24 10/16/2020   GLUCOSE 98 11/09/2020   GLUCOSE 136 (H) 11/08/2020   GLUCOSE 104 (H) 10/16/2020   BUN 71 (H) 11/09/2020   BUN 53 (H) 11/08/2020   BUN 52 (H) 10/16/2020   CREATININE 10.36 (H) 11/09/2020   CREATININE 9.53 (H) 11/08/2020   CREATININE 7.43 (H) 10/16/2020   CALCIUM 8.8 (L) 11/09/2020   CALCIUM 9.5 11/08/2020   CALCIUM 9.3 10/16/2020   LFT Recent Labs    11/08/20 1404 11/09/20 0806  PROT 7.9 5.9*  ALBUMIN 3.3* 2.6*  AST 38 30  ALT 18 18  ALKPHOS 61 55  BILITOT 0.9 0.7  BILIDIR  --  0.1  IBILI  --  0.6   PT/INR Lab Results  Component Value Date   INR 1.0 10/06/2020   INR 1.48 09/25/2013   INR 0.99 05/18/2013   Hepatitis Panel No results for input(s): HEPBSAG, HCVAB, HEPAIGM, HEPBIGM in the last 72 hours. C-Diff No components found for: CDIFF Lipase     Component Value Date/Time   LIPASE 22 11/09/2020 0806    Drugs of Abuse     Component Value Date/Time   LABOPIA NONE DETECTED 11/08/2013 1304   COCAINSCRNUR NONE DETECTED 11/08/2013 1304   COCAINSCRNUR NEGATIVE 09/25/2012 2042   LABBENZ NONE DETECTED 11/08/2013 1304   LABBENZ NEGATIVE 09/25/2012 2042   AMPHETMU NONE DETECTED 11/08/2013 1304   THCU NONE DETECTED 11/08/2013 1304   LABBARB NONE DETECTED 11/08/2013 1304     RADIOLOGY STUDIES: DG Chest 2 View  Result Date: 11/08/2020 CLINICAL DATA:  Nausea x1 day. EXAM: CHEST - 2 VIEW COMPARISON:  October 15, 2020 FINDINGS: The lateral views limited in evaluation secondary to positioning of the patient's upper  extremities. There is stable right-sided venous catheter positioning. Tubing from a ventriculoperitoneal shunt is also seen. Mild atelectasis and/or infiltrate is seen within the retrocardiac region of the left lung base. There is no evidence of a pleural effusion or pneumothorax. The cardiac silhouette is mildly enlarged. Marked severity calcification of the thoracic aorta is seen. The visualized skeletal structures are unremarkable. IMPRESSION: Mild left basilar atelectasis and/or infiltrate. Electronically Signed   By: Virgina Norfolk M.D.   On: 11/08/2020 19:52   DG Wrist 2 Views Left  Result Date: 11/09/2020 CLINICAL DATA:  Fall EXAM: LEFT WRIST - 2 VIEW COMPARISON:  None. FINDINGS: There is a comminuted impacted distal radius fracture with slight posterior angulation. There is a non displaced ulnar styloid fracture. Overlying soft tissue swelling is seen. IMPRESSION: Comminuted impacted distal radius fracture. Nondisplaced ulnar styloid fracture. Electronically Signed   By: Prudencio Pair M.D.   On: 11/09/2020 02:22   CT ABDOMEN PELVIS W CONTRAST  Result Date: 11/08/2020 CLINICAL DATA:  Epigastric pain EXAM: CT ABDOMEN AND PELVIS WITH CONTRAST TECHNIQUE: Multidetector CT imaging of the abdomen and pelvis was performed using the standard protocol following bolus administration of intravenous contrast. CONTRAST:  61m OMNIPAQUE IOHEXOL  300 MG/ML  SOLN COMPARISON:  None. FINDINGS: Lower chest: The visualized heart size within normal limits. No pericardial fluid/thickening. No hiatal hernia. A small left pleural effusion is seen. Patchy airspace opacity seen at the left lung base. Hepatobiliary: Multiple low-density lesions are seen throughout the liver parenchyma the largest measuring 1.2 cm in the superior right liver lobe.The main portal vein is patent. No evidence of calcified gallstones, gallbladder wall thickening or biliary dilatation. Pancreas: There is mild pancreatic ductal dilatation measuring  up to 2 mm. This is nonspecific however. Spleen: Normal in size without focal abnormality. Adrenals/Urinary Tract: Both adrenal glands appear normal. Mild bilateral renal atrophy seen. There is scattered calcification the largest measuring 3 mm in the midpole of the right kidney. No hydronephrosis. Bladder is unremarkable. Stomach/Bowel: The stomach, small bowel, are normal in appearance. There is scattered colonic diverticula. There is a moderately distended rectal fecal ball. Vascular/Lymphatic: There are no enlarged mesenteric, retroperitoneal, or pelvic lymph nodes. Scattered aortic atherosclerotic calcifications are seen without aneurysmal dilatation. Reproductive: The patient is status post hysterectomy. No adnexal masses or collections seen. Other: No evidence of abdominal wall mass or hernia. Musculoskeletal: No acute or significant osseous findings. IMPRESSION: Small left pleural effusion with patchy airspace opacity at the left lung base which may be due to atelectasis and/or infectious etiology Nonspecific mild pancreatic ductal dilatation Diverticulosis without diverticulitis Moderately dilated rectum with a fecal ball Aortic Atherosclerosis (ICD10-I70.0). Electronically Signed   By: Prudencio Pair M.D.   On: 11/08/2020 20:34   DG Chest Port 1 View  Result Date: 11/09/2020 CLINICAL DATA:  Shortness of breath EXAM: PORTABLE CHEST 1 VIEW COMPARISON:  November 08, 2020 FINDINGS: Central catheter tip is at the cavoatrial junction. There is a intraperitoneal shunt catheter extending along the medial right hemithorax, stable. There is ill-defined airspace opacity in the left lower lobe. Lungs elsewhere clear. Heart is mildly enlarged with pulmonary venous hypertension. There is aortic atherosclerosis. No adenopathy. There is a loop recorder on the left. No bone lesions. IMPRESSION: Central catheter position unchanged. No pneumothorax. Airspace opacity likely representing pneumonia left lower lobe. Lungs  elsewhere clear. There is mild cardiomegaly with a degree of pulmonary vascular congestion. Aortic Atherosclerosis (ICD10-I70.0). Electronically Signed   By: Lowella Grip III M.D.   On: 11/09/2020 08:37   DG Abd Portable 1V  Result Date: 11/09/2020 CLINICAL DATA:  Abdominal pain, anemia EXAM: PORTABLE ABDOMEN - 1 VIEW COMPARISON:  11/08/2020 CT abdomen/pelvis FINDINGS: No disproportionately dilated small bowel loops. Moderate rectal stool, similar. Mild to moderate colonic stool and gas. No evidence of pneumatosis or pneumoperitoneum. Right-sided VP shunt catheter terminates in the left abdomen and appears intact and without kink. No radiopaque nephrolithiasis. IMPRESSION: Nonobstructive bowel gas pattern. Moderate rectal stool, similar to recent CT. Mild to moderate colonic stool and gas. Electronically Signed   By: Ilona Sorrel M.D.   On: 11/09/2020 10:11   [place] EKG SITE RITE  Result Date: 11/09/2020 If Site Rite image not attached, placement could not be confirmed due to current cardiac rhythm.     IMPRESSION:   *   Upper abdominal pain with reported nausea and vomiting of unclear appearance.  Stool is nonbloody, nonmelenic. Lipase, LFTs normal.  *    Acute drop in Hb question spurious value.  Repeat Hgb pending.  *    ESRD, on HD.  *    History of cerebral aneurysm/SAH 2013.  Embolic CVA [id number].  Seizures.    *   Hx A. fib,  not on anticoagulation.  *    BMP 3445.  Hx of diastolic heart failure.  Last echo 09/2020 w LVEF 65 to 70%.  Normal pulmonary pressures.  Normal right ventricular size and systolic function  *   [id number] pneumonia.  Apparently never vaccinated    PLAN:     *   Repeat Hb is 6.5.  So consider repeat CTAP.    *   Keep NPO  *   Continue the Protonix IV bid.    Azucena Freed  11/09/2020, 10:30 AM Phone [phone]  I have reviewed the entire case in detail with the above APP and discussed the plan in detail.  Therefore, I agree with the diagnoses  recorded above. In addition,  I have personally interviewed and examined the patient and have personally reviewed any abdominal/pelvic CT scan images.  My additional thoughts are as follows:  Poor historian, medically complex.  Apparent acute upper abdominal pain nausea and vomiting for 2 or 3 days(?),  Abdominal exam with mild upper abdominal tenderness, no evidence of obstruction.  CT scan with some chronic pancreatic findings that do not explain the pain.  No other areas of inflammation, fluid collection or bleeding.  Marked decrease in hemoglobin overnight, initially thought may be spurious but was true on repeat testing.  Rectal exam without melena or bright red blood. Given patient's stable blood pressure and rectal exam finding, it seems unlikely that this drop in hemoglobin is GI bleeding, especially having dropped 4 g of hemoglobin in a short period of time.  Cause abdominal pain uncertain at present, perhaps recent infectious illness and may be even from her COVID.  Marked decline in hemoglobin raises concern for retroperitoneal bleed, so after communication with Dr. Candiss Norse of [facility], he is planning a repeat CT abdomen pelvis. There is most likely a significant chronic element to the patient's anemia given her underlying end-stage renal disease, but the drop in hemoglobin is certainly more than would be expected to occur from IV fluid administration.  No current plans for endoscopy, we will follow the patient closely with you, please inform us of any evidence of overt GI bleeding.  Nelida Meuse III [phone]

## 2020-11-09 NOTE — Consult Note (Signed)
[facility] Renal Consultation Note  Requesting MD: Lala Lund, MD Indication for Consultation:  ESRD  Chief complaint:  Nausea/vomiting and abd pain  HPI:  [name] is a 60 y.o. female with a history of ESRD on hemodialysis, hypertension, chronic diastolic CHF, coronary artery disease, and tobacco abuse who presented to the hospital with nausea vomiting and abdominal pain.  She was found to be COVID-positive.  Nephrology is consulted for assistance with management of hemodialysis.  Course complicated by A. fib with RVR.  She was given cardiazem.  She's had an acute decline in her hemoglobin from 11.0 to 6.5.  She is not able to provide additional history.  She only states that she came in with abdominal pain and is not able to tell me anything else.  Have spoken with primary team and they are working up for cause of the bleed and getting a CT scan.  She's going to get her first unit of blood soon.  Last HD reported as 1/18 per charting below.  She's not able to tell me when she got HD.  She is on the floor currently and is to be moved to the ICU.   PMHx:   Past Medical History:  Diagnosis Date  . Anterior cerebral artery aneurysm    1996 AT [facility]  . Coronary atherosclerosis of native coronary artery    STEMI Feb 2009 - 70% distal LAD lesion c/w plaque rupture, DES distal RCA  . Diastolic heart failure    LVEF 60-65% with grade 2 diastolic dysfunction - July 2014  . End-stage renal disease on hemodialysis ([place]) 02/03/2019  . ESRD (end stage renal disease) on dialysis ([place])   . HTN (hypertension)    Resistant  . Hypercholesterolemia   . Subarachnoid hemorrhage ([place])    03/2012  . Tobacco abuse    Resumed smoking half a pack a day. She was a smoker in the past and states she was abl eto stop in the past using nicotine patches  . Unspecified atrial fibrillation ([place]) 02/03/2019    Past Surgical History:  Procedure Laterality Date  . ABDOMINAL HYSTERECTOMY    .  BASCILIC VEIN TRANSPOSITION Right 05/13/2013   Procedure: [place];  Surgeon: Rosetta Posner, MD;  Location: [place];  Service: Vascular;  Laterality: Right;  . BRAIN SURGERY     2   . INSERTION OF DIALYSIS CATHETER Right 10/15/2020   Procedure: INSERTION OF TUNNELED DIALYSIS CATHETER;  Surgeon: Cherre Robins, MD;  Location: [place];  Service: Vascular;  Laterality: Right;  . LOOP RECORDER IMPLANT N/A 11/14/2013   Procedure: LOOP RECORDER IMPLANT;  Surgeon: Evans Lance, MD;  Location: [facility] CATH LAB;  Service: Cardiovascular;  Laterality: N/A;  . REVISON OF ARTERIOVENOUS FISTULA Right 10/15/2020   Procedure: REVISON OF ARTERIOVENOUS FISTULA ARM;  Surgeon: Cherre Robins, MD;  Location: [facility] OR;  Service: Vascular;  Laterality: Right;  . TEE WITHOUT CARDIOVERSION N/A 11/14/2013   Procedure: TRANSESOPHAGEAL ECHOCARDIOGRAM (TEE);  Surgeon: Candee Furbish, MD;  Location: [facility] ENDOSCOPY;  Service: Cardiovascular;  Laterality: N/A;  . VENTRICULOPERITONEAL SHUNT  03/27/2012   Procedure: SHUNT INSERTION VENTRICULAR-PERITONEAL;  Surgeon: Winfield Cunas, MD;  Location: [facility] NEURO ORS;  Service: Neurosurgery;  Laterality: N/A;  Ventricular-Peritoneal Shunt Insertion    Family Hx:  Family History  Problem Relation Age of Onset  . Heart disease Mother   . Heart disease Father   . Coronary artery disease Other  Premature in 1st degree relatives    Social History:  reports that she quit smoking about 3 years ago. Her smoking use included cigarettes. She has a 7.00 pack-year smoking history. She has never used smokeless tobacco. She reports that she does not drink alcohol and does not use drugs.  Allergies:  Allergies  Allergen Reactions  . Amoxicillin Hives and Rash    Informed by pt's daughter [name]   . Penicillins Hives and Rash    Has patient had a PCN reaction causing immediate rash, facial/tongue/throat swelling, SOB or lightheadedness with hypotension: Yes Has patient had  a PCN reaction causing severe rash involving mucus membranes or skin necrosis: No Has patient had a PCN reaction that required hospitalization: No Has patient had a PCN reaction occurring within the last 10 years: No If all of the above answers are "NO", then may proceed with Cephalosporin use.     Medications: Prior to Admission medications   Medication Sig Start Date End Date Taking? Authorizing Provider  aspirin 81 MG EC tablet Take 81 mg by mouth every 8 (eight) hours as needed for pain. 12/31/17  Yes [provider]  calcium acetate (PHOSLO) 667 MG capsule Take 667 mg by mouth 3 (three) times daily. 11/28/19  Yes [provider]  metoprolol tartrate (LOPRESSOR) 25 MG tablet Take 1 tablet (25 mg total) by mouth 2 (two) times daily. 10/16/20  Yes Annita Brod, MD  rosuvastatin (CRESTOR) 10 MG tablet Take 10 mg by mouth daily. 10/16/20  Yes [provider]  amiodarone (PACERONE) 200 MG tablet Take 2 tabs ('400mg'$ ) daily for 7 days, then 1 tab ('200mg'$ ) daily thereafter Patient not taking: Reported on 11/09/2020 03/24/18   Mendel Corning, MD  calcitRIOL (ROCALTROL) 0.5 MCG capsule Take 1 capsule (0.5 mcg total) by mouth Every Tuesday,Thursday,and Saturday with dialysis. Patient not taking: Reported on 11/09/2020 02/05/19   Modena Jansky, MD  nitroGLYCERIN (NITROSTAT) 0.4 MG SL tablet Place 1 tablet (0.4 mg total) under the tongue every 5 (five) minutes as needed for chest pain. Patient not taking: Reported on 11/09/2020 05/18/13   Janece Canterbury, MD  sucroferric oxyhydroxide (VELPHORO) 500 MG chewable tablet Chew 2 tablets (1,000 mg total) by mouth 3 (three) times daily with meals. Patient not taking: Reported on 11/09/2020 02/04/19   Modena Jansky, MD    I have reviewed the patient's current and reported prior to admission medications.  Labs:  BMP Latest Ref Rng & Units 11/09/2020 11/08/2020 10/16/2020  Glucose 70 - 99 mg/dL 98 136(H) 104(H)  BUN 6 - 20 mg/dL  71(H) 53(H) 52(H)  Creatinine 0.44 - 1.00 mg/dL 10.36(H) 9.53(H) 7.43(H)  Sodium 135 - 145 mmol/L 134(L) 140 134(L)  Potassium 3.5 - 5.1 mmol/L 4.1 4.1 5.9(H)  Chloride 98 - 111 mmol/L 88(L) 91(L) 96(L)  CO2 22 - 32 mmol/L 24 21(L) 24  Calcium 8.9 - 10.3 mg/dL 8.8(L) 9.5 9.3    ROS:  Unable to obtain secondary to altered mental status   Physical Exam: Vitals:   11/09/20 1130 11/09/20 1145  BP: (!) 80/51 (!) 89/62  Pulse: 69 63  Resp: 17 20  Temp:    SpO2: 100% 99%     General: thin elderly female in bed  HEENT: NCAT Eyes: EOMI sclera anicteric Neck: supple trachea midline Heart: S1S2 no rub Lungs: clear to auscultation anteriorly  Abdomen: soft/nontender on my exam; nondistended  Extremities: no edema appreciated no cyanosis or clubbing  Skin: no rash on extremities  exposed or bruising on exposed part of back  Neuro: awake but intermittently answers questions    Outpatient HD Orders:  [facility] - TTS 3.75hrs 400/800 38kg 2K 2Ca TDC Calcitriol '120mg'$  qHD No Heparin mircera 74mg q2wks - last 1064m on 08/23/20 Calcitriol 70m22mqHD  Velphoro 2 AC TID, 1 with snacks  Last HD 3hrs on 1/18.  Mostly compliant. Missed VVS post OP f/u - plication on [id number]/Plan:  Acute anemia - team is working up with CT and agree with PRBC's - she is to get a unit shortly.  Exacerbating the anemia of CKD.  aranesp 100 mcg on 1/22  ESRD - HD per TTS schedule - next tx on 1/22; no emergent need for HD today   AMS - appreciate efforts of primary team.  Caution with sedating meds.  Would avoid morphine in ESRD patients  Hypotension - normal saline 250 mL bolus now and to get PRBC's  A fib - per primary team   Covid 19 infection - per primary team ;on remdesivir   Metabolic bone disease - binders when taking PO. Continue calcitriol   LorClaudia Desanctis21/2022, 1:22 PM

## 2020-11-09 NOTE — Progress Notes (Signed)
Initial attempt made to place a left IJ  There was blood flow consistently through the access needle, unable to thread the guidewire  Ultrasound did reveal possible thrombosis

## 2020-11-09 NOTE — Consult Note (Signed)
NAME:  [name], MRN:  [id number], DOB:  January 24, 1961, LOS: 0 ADMISSION DATE:  11/08/2020, CONSULTATION DATE:  11/09/20 REFERRING MD:  Dr. Candiss Norse, CHIEF COMPLAINT:  GIB   Brief History:  60 y/o F, unvaccinated, admitted 1/20 with reports of nausea, vomiting and epigastric pain.   History of Present Illness:  60 y/o F, unvaccinated, admitted 1/20 with reports of nausea, vomiting and epigastric pain. Initial evaluation notable for [facility] and CXR with possible infiltrates.  Her Hgb is 11 at baseline.  She reportedly skipped HD prior to coming to the hospital.  She was started on a Cardizem gtt.  CT of the ABD/Pelvis was completed which showed a small effusion and patchy opacity in the LLL, non-specific mild PD dilation, diverticulosis without diverticulitis, dilated rectum containing a fecal ball. She was admitted per [facility] for further evaluation. She complained of wrist pain on the left.  XRAY of the wrist demonstrated a distal radius and ulnar styloid fracture. She was additionally found to be positive for COVID.    On 1/21 the patient was noted to have a drop in Hgb from 11 to 6.8 with concern for GIB vs RP bleed.  Due to limited IV access, a central line was placed due to limited IV access. The patient had soft normal blood pressures. GI evaluated the patient and repeat CT abd/pelvis pending.   Past Medical History:  ESRD on HD  TTS CAD HTN HLD PAF - not on anticoagulation due to cerebral aneurysm with subarachnoid hemorrhage in 2013 Seizure Disorder  Embolic CVA - [id number]   Significant Hospital Events:  1/20 Admit  1/21 PCCM consulted for evaluation   Consults:  GI   Procedures:  R Fem TLC 1/21 >>  Significant Diagnostic Tests:   CT ABD/Pelvis 1/20 >> small left pleural effusion with patchy airspace opacity in left lung base, non-specific mild PD dilation, diverticulosis without diverticulitis, dilated rectum containing a fecal ball.   CT ABD/Pelvis 1/21 >>  Micro Data:  COVID  1/20 >> positive   Antimicrobials:    Interim History / Subjective:  Afebrile Concern for bleeding eith Hgb drop Limited IV access   Objective   Blood pressure (!) 89/62, pulse 63, temperature (!) 97.5 F (36.4 C), temperature source Oral, resp. rate 20, SpO2 99 %.       No intake or output data in the 24 hours ending 11/09/20 1204 There were no vitals filed for this visit.  Examination: General: Middle-age, very frail HEENT: MM pink/dry oral mucosa neuro: Awake and alert, interactive CV: S1-S2 appreciated with no murmur PULM: Decreased air movement bilaterally GI: soft, bsx4 active  Extremities: warm/dry, no peripheral edema  Skin: no rashes or lesions   Resolved Hospital Problem list     Assessment & Plan:   Acute Blood Loss Anemia superimposed on Chronic Anemia -transfer to ICU  -Current hemoglobin of 6.5 -transfuse for Hgb >7%, 2 units PRBC 1/21  -appreciate GI evaluation  -assess STAT CT abd/pelvis  -follow H/H Q6 x4   Abdominal Pain  Nausea / Vomiting  Initial CT ABD non-acute.  KUB 1/21 am without free air.   -NPO for now  -assess CT ABD/Pelvis now to r/o RP bleed  -continue PPI   COVID  -continue remdesivir  -follow O2 needs, O2 to support sats >85% at rest (fortunately, she has not need O2 thus far)   Hypovolemic Shock  -volume resuscitation with PRBC's  -stop sedating medications  -stop cardizem  -transfer to ICU  -levophed if  needed for MAP >65   AFwRVR  Not on anticoagulation due to hx of SAH -previously on IV cardizem + PO, stop given hypotension -tele monitoring -volume resuscitation with PRBC's  ESRD  TTS, missed 1/20 prior to admit  -Trend BMP / urinary output -Replace electrolytes as indicated -Renal service on board  Hx Seizures  -continue keppra   Limited IV Access  Has R tunneled HD cath, R arm AVF, unable to place L IJ / pass wire  -right femoral TLC placed  Follow abdominal CT  Best practice (evaluated daily)   Diet: NPO Pain/Anxiety/Delirium protocol (if indicated): n/a VAP protocol (if indicated): n/a DVT prophylaxis: SCD's  GI prophylaxis: PPI  Glucose control: n/a Mobility: as tolerated  Disposition:ICU  Goals of Care:  Last date of multidisciplinary goals of care discussion: Pending Family and staff present: None Summary of discussion: Pending Follow up goals of care discussion due: Pending Code Status: Full code  Labs   CBC: Recent Labs  Lab 11/08/20 1404 11/09/20 0806 11/09/20 1008  WBC 6.8 5.9  --   HGB 11.0* 6.8* 6.5*  HCT 33.5* 20.6* 20.0*  MCV 90.1 88.0  --   PLT 287 255  --     Basic Metabolic Panel: Recent Labs  Lab 11/08/20 1404 11/09/20 0806  NA 140 134*  K 4.1 4.1  CL 91* 88*  CO2 21* 24  GLUCOSE 136* 98  BUN 53* 71*  CREATININE 9.53* 10.36*  CALCIUM 9.5 8.8*  MG 2.6* 2.5*   GFR: Estimated Creatinine Clearance: 3.8 mL/min (A) (by C-G formula based on SCr of 10.36 mg/dL (H)). Recent Labs  Lab 11/08/20 1404 11/09/20 0806  PROCALCITON 14.27 29.06  WBC 6.8 5.9    Liver Function Tests: Recent Labs  Lab 11/08/20 1404 11/09/20 0806  AST 38 30  ALT 18 18  ALKPHOS 61 55  BILITOT 0.9 0.7  PROT 7.9 5.9*  ALBUMIN 3.3* 2.6*   Recent Labs  Lab 11/08/20 1822 11/09/20 0806  LIPASE 21 22   No results for input(s): AMMONIA in the last 168 hours.  ABG    Component Value Date/Time   PHART 7.429 11/09/2013 0335   PCO2ART 38.1 11/09/2013 0335   PO2ART 80.1 11/09/2013 0335   HCO3 24.8 (H) 11/09/2013 0335   TCO2 28 10/15/2020 1004   ACIDBASEDEF 4.7 (H) 03/27/2012 2300   O2SAT 95.3 11/09/2013 0335     Coagulation Profile: No results for input(s): INR, PROTIME in the last 168 hours.  Cardiac Enzymes: No results for input(s): CKTOTAL, CKMB, CKMBINDEX, TROPONINI in the last 168 hours.  HbA1C: Hgb A1c MFr Bld  Date/Time Value Ref Range Status  10/07/2020 02:05 AM 5.2 4.8 - 5.6 % Final    Comment:    (NOTE) Pre diabetes:           5.7%-6.4%  Diabetes:              >6.4%  Glycemic control for   <7.0% adults with diabetes   11/09/2013 04:33 AM 5.9 (H) <5.7 % Final    Comment:    (NOTE)                                                                       According to  the ADA Clinical Practice Recommendations for 2011, when HbA1c is used as a screening test:  >=6.5%   Diagnostic of Diabetes Mellitus           (if abnormal result is confirmed) 5.7-6.4%   Increased risk of developing Diabetes Mellitus References:Diagnosis and Classification of Diabetes Mellitus,Diabetes [id number] 1):S62-S69 and Standards of Medical Care in         Diabetes - 2011,Diabetes [id number] (Suppl 1):S11-S61.    CBG: No results for input(s): GLUCAP in the last 168 hours.  Review of Systems:   Unable to complete as patient hemodynamically unstable.   Past Medical History:  She,  has a past medical history of Anterior cerebral artery aneurysm, Coronary atherosclerosis of native coronary artery, Diastolic heart failure, End-stage renal disease on hemodialysis ([place]) (02/03/2019), ESRD (end stage renal disease) on dialysis ([place]), HTN (hypertension), Hypercholesterolemia, Subarachnoid hemorrhage ([place]), Tobacco abuse, and Unspecified atrial fibrillation ([place]) (02/03/2019).   Surgical History:   Past Surgical History:  Procedure Laterality Date  . ABDOMINAL HYSTERECTOMY    . BASCILIC VEIN TRANSPOSITION Right 05/13/2013   Procedure: [place];  Surgeon: Rosetta Posner, MD;  Location: [place];  Service: Vascular;  Laterality: Right;  . BRAIN SURGERY     2   . INSERTION OF DIALYSIS CATHETER Right 10/15/2020   Procedure: INSERTION OF TUNNELED DIALYSIS CATHETER;  Surgeon: Cherre Robins, MD;  Location: [place];  Service: Vascular;  Laterality: Right;  . LOOP RECORDER IMPLANT N/A 11/14/2013   Procedure: LOOP RECORDER IMPLANT;  Surgeon: Evans Lance, MD;  [facility] C[facility] LAB;  Service: Cardiovascular;  Laterality: N/A;   . REVISON OF ARTERIOVENOUS FISTULA Right 10/15/2020   Procedure: REVISON OF ARTERIOVENOUS FISTULA ARM;  Surgeon: Cherre Robins, MD;  Location: [facility] OR;  Service: Vascular;  Laterality: Right;  . TEE WITHOUT CARDIOVERSION N/A 11/14/2013   Procedure: TRANSESOPHAGEAL ECHOCARDIOGRAM (TEE);  Surgeon: Candee Furbish, MD;  [facility] ENDOSCOPY;  Service: Cardiovascular;  Laterality: N/A;  . VENTRICULOPERITONEAL SHUNT  03/27/2012   Procedure: SHUNT INSERTION VENTRICULAR-PERITONEAL;  Surgeon: Winfield Cunas, MD;  Location: [place] NEURO ORS;  Service: Neurosurgery;  Laterality: N/A;  Ventricular-Peritoneal Shunt Insertion     Social History:   reports that she quit smoking about 3 years ago. Her smoking use included cigarettes. She has a 7.00 pack-year smoking history. She has never used smokeless tobacco. She reports that she does not drink alcohol and does not use drugs.   Family History:  Her family history includes Coronary artery disease in an other family member; Heart disease in her father and mother.   Allergies Allergies  Allergen Reactions  . Amoxicillin Hives and Rash    Informed by pt's daughter [name]   . Penicillins Hives and Rash    Has patient had a PCN reaction causing immediate rash, facial/tongue/throat swelling, SOB or lightheadedness with hypotension: Yes Has patient had a PCN reaction causing severe rash involving mucus membranes or skin necrosis: No Has patient had a PCN reaction that required hospitalization: No Has patient had a PCN reaction occurring within the last 10 years: No If all of the above answers are "NO", then may proceed with Cephalosporin use.      Home Medications  Prior to Admission medications   Medication Sig Start Date End Date Taking? Authorizing Provider  aspirin 81 MG EC tablet Take 81 mg by mouth every 8 (eight) hours as needed for pain. 12/31/17  Yes [provider]  calcium acetate (PHOSLO) 667 MG  capsule Take 667 mg by mouth 3  (three) times daily. 11/28/19  Yes [provider]  metoprolol tartrate (LOPRESSOR) 25 MG tablet Take 1 tablet (25 mg total) by mouth 2 (two) times daily. 10/16/20  Yes Annita Brod, MD  rosuvastatin (CRESTOR) 10 MG tablet Take 10 mg by mouth daily. 10/16/20  Yes [provider]  amiodarone (PACERONE) 200 MG tablet Take 2 tabs ('400mg'$ ) daily for 7 days, then 1 tab ('200mg'$ ) daily thereafter Patient not taking: Reported on 11/09/2020 03/24/18   Mendel Corning, MD  calcitRIOL (ROCALTROL) 0.5 MCG capsule Take 1 capsule (0.5 mcg total) by mouth Every Tuesday,Thursday,and Saturday with dialysis. Patient not taking: Reported on 11/09/2020 02/05/19   Modena Jansky, MD  nitroGLYCERIN (NITROSTAT) 0.4 MG SL tablet Place 1 tablet (0.4 mg total) under the tongue every 5 (five) minutes as needed for chest pain. Patient not taking: Reported on 11/09/2020 05/18/13   Janece Canterbury, MD  sucroferric oxyhydroxide (VELPHORO) 500 MG chewable tablet Chew 2 tablets (1,000 mg total) by mouth 3 (three) times daily with meals. Patient not taking: Reported on 11/09/2020 02/04/19   Modena Jansky, MD    The patient is critically ill with multiple organ systems failure and requires high complexity decision making for assessment and support, frequent evaluation and titration of therapies, application of advanced monitoring technologies and extensive interpretation of multiple databases. Critical Care Time devoted to patient care services described in this note independent of APP/resident time (if applicable)  is 30 minutes.   Sherrilyn Rist MD [place] [facility] Personal pager: [phone] If unanswered, please page CCM On-call: [phone]

## 2020-11-09 NOTE — Progress Notes (Signed)
Pharmacy Antibiotic Note  Avamaria Faubel is a 60 y.o. female admitted on 11/08/2020 with pneumonia.  Pharmacy has been consulted for Levaquin dosing.  Plan: Levaquin '750mg'$  IV x1 followed by '500mg'$  IV Q48H.  Temp (24hrs), Avg:98.1 F (36.7 C), Min:97.6 F (36.4 C), Max:98.6 F (37 C)  Recent Labs  Lab 11/08/20 1404  WBC 6.8  CREATININE 9.53*    Estimated Creatinine Clearance: 4.1 mL/min (A) (by C-G formula based on SCr of 9.53 mg/dL (H)).    Allergies  Allergen Reactions  . Amoxicillin     Informed by pt's daughter [name]   . Penicillins Hives and Rash    Has patient had a PCN reaction causing immediate rash, facial/tongue/throat swelling, SOB or lightheadedness with hypotension: Yes Has patient had a PCN reaction causing severe rash involving mucus membranes or skin necrosis: No Has patient had a PCN reaction that required hospitalization: No Has patient had a PCN reaction occurring within the last 10 years: No If all of the above answers are "NO", then may proceed with Cephalosporin use.      Thank you for allowing pharmacy to be a part of this patient's care.  Wynona Neat, PharmD, BCPS  11/09/2020 4:11 AM

## 2020-11-09 NOTE — ED Notes (Signed)
Ortho tech at bedside 

## 2020-11-09 NOTE — Procedures (Signed)
Central Venous Catheter Insertion Procedure Note  [name]  [id number]  1961/08/04  Date:11/09/20  Time:12:00 PM   Provider Performing:Orlin Kann A Lux Skilton   Procedure: Insertion of Non-tunneled Central Venous [phone]) with US guidance [id number])   Indication(s) Medication administration and Difficult access  Consent Risks of the procedure as well as the alternatives and risks of each were explained to the patient and/or caregiver.  Consent for the procedure was obtained and is signed in the bedside chart  Anesthesia Topical only with 1% lidocaine   Timeout Verified patient identification, verified procedure, site/side was marked, verified correct patient position, special equipment/implants available, medications/allergies/relevant history reviewed, required imaging and test results available.  Sterile Technique Maximal sterile technique including full sterile barrier drape, hand hygiene, sterile gown, sterile gloves, mask, hair covering, sterile ultrasound probe cover (if used).  Procedure Description Area of catheter insertion was cleaned with chlorhexidine and draped in sterile fashion.  With real-time ultrasound guidance a central venous catheter was placed into the right femoral vein. Nonpulsatile blood flow and easy flushing noted in all ports.  The catheter was sutured in place and sterile dressing applied.  Complications/Tolerance None; patient tolerated the procedure well. Chest X-ray is ordered to verify placement for internal jugular or subclavian cannulation.   Chest x-ray is not ordered for femoral cannulation.  EBL Minimal  Specimen(s) None

## 2020-11-09 NOTE — ED Notes (Signed)
Pt c/o excruciating left wrist pain, md ghosh notified. Pt more talkative reporting daughter fell on wrist but does not know the time frame of event.

## 2020-11-09 NOTE — Consult Note (Signed)
Reason for Consult:Left wrist fx Referring Physician: Ronnie Derby Time called: 0730 Time at bedside: 0826   [name] is an 60 y.o. female.  HPI: Tressie was admitted yesterday with N/V and abd pain. Although she didn't complain about it at the time of presentation she c/o severe left wrist pain early this morning. There was some report from the patient that her daughter fell on it but she couldn't give any more details or remember when it happened. X-rays showed a left wrist fx. She was splinted and hand surgery was consulted later that morning. She is somewhat encephalopathic this morning and cannot really contribute to history or exam nor tell me hand dominance.  Past Medical History:  Diagnosis Date  . Anterior cerebral artery aneurysm    1996 AT [facility]  . Coronary atherosclerosis of native coronary artery    STEMI Feb 2009 - 70% distal LAD lesion c/w plaque rupture, DES distal RCA  . Diastolic heart failure    LVEF 60-65% with grade 2 diastolic dysfunction - July 2014  . End-stage renal disease on hemodialysis ([place]) 02/03/2019  . ESRD (end stage renal disease) on dialysis ([place])   . HTN (hypertension)    Resistant  . Hypercholesterolemia   . Subarachnoid hemorrhage ([place])    03/2012  . Tobacco abuse    Resumed smoking half a pack a day. She was a smoker in the past and states she was abl eto stop in the past using nicotine patches  . Unspecified atrial fibrillation ([place]) 02/03/2019    Past Surgical History:  Procedure Laterality Date  . ABDOMINAL HYSTERECTOMY    . BASCILIC VEIN TRANSPOSITION Right 05/13/2013   Procedure: [place];  Surgeon: Rosetta Posner, MD;  Location: [place];  Service: Vascular;  Laterality: Right;  . BRAIN SURGERY     2   . INSERTION OF DIALYSIS CATHETER Right 10/15/2020   Procedure: INSERTION OF TUNNELED DIALYSIS CATHETER;  Surgeon: Cherre Robins, MD;  Location: [place];  Service: Vascular;  Laterality: Right;  . LOOP RECORDER IMPLANT N/A  11/14/2013   Procedure: LOOP RECORDER IMPLANT;  Surgeon: Evans Lance, MD;  Location: [facility] CATH LAB;  Service: Cardiovascular;  Laterality: N/A;  . REVISON OF ARTERIOVENOUS FISTULA Right 10/15/2020   Procedure: REVISON OF ARTERIOVENOUS FISTULA ARM;  Surgeon: Cherre Robins, MD;  Location: [facility] OR;  Service: Vascular;  Laterality: Right;  . TEE WITHOUT CARDIOVERSION N/A 11/14/2013   Procedure: TRANSESOPHAGEAL ECHOCARDIOGRAM (TEE);  Surgeon: Candee Furbish, MD;  Location: [facility] ENDOSCOPY;  Service: Cardiovascular;  Laterality: N/A;  . VENTRICULOPERITONEAL SHUNT  03/27/2012   Procedure: SHUNT INSERTION VENTRICULAR-PERITONEAL;  Surgeon: Winfield Cunas, MD;  Location: [place] NEURO ORS;  Service: Neurosurgery;  Laterality: N/A;  Ventricular-Peritoneal Shunt Insertion    Family History  Problem Relation Age of Onset  . Heart disease Mother   . Heart disease Father   . Coronary artery disease Other        Premature in 1st degree relatives    Social History:  reports that she quit smoking about 3 years ago. Her smoking use included cigarettes. She has a 7.00 pack-year smoking history. She has never used smokeless tobacco. She reports that she does not drink alcohol and does not use drugs.  Allergies:  Allergies  Allergen Reactions  . Amoxicillin     Informed by pt's daughter [name]   . Penicillins Hives and Rash    Has patient had a PCN reaction causing immediate rash, facial/tongue/throat swelling,  SOB or lightheadedness with hypotension: Yes Has patient had a PCN reaction causing severe rash involving mucus membranes or skin necrosis: No Has patient had a PCN reaction that required hospitalization: No Has patient had a PCN reaction occurring within the last 10 years: No If all of the above answers are "NO", then may proceed with Cephalosporin use.     Medications: I have reviewed the patient's current medications.  Results for orders placed or performed during the hospital encounter of  11/08/20 (from the past 48 hour(s))  CBC     Status: Abnormal   Collection Time: 11/08/20  2:04 PM  Result Value Ref Range   WBC 6.8 4.0 - 10.5 K/uL   RBC 3.72 (L) 3.87 - 5.11 MIL/uL   Hemoglobin 11.0 (L) 12.0 - 15.0 g/dL   HCT 33.5 (L) 36.0 - 46.0 %   MCV 90.1 80.0 - 100.0 fL   MCH 29.6 26.0 - 34.0 pg   MCHC 32.8 30.0 - 36.0 g/dL   RDW 15.8 (H) 11.5 - 15.5 %   Platelets 287 150 - 400 K/uL   nRBC 0.0 0.0 - 0.2 %    Comment: Performed at [facility], [place] [address], [place], [place] [address]  Comprehensive metabolic panel     Status: Abnormal   Collection Time: 11/08/20  2:04 PM  Result Value Ref Range   Sodium 140 135 - 145 mmol/L   Potassium 4.1 3.5 - 5.1 mmol/L   Chloride 91 (L) 98 - 111 mmol/L   CO2 21 (L) 22 - 32 mmol/L   Glucose, Bld 136 (H) 70 - 99 mg/dL    Comment: Glucose reference range applies only to samples taken after fasting for at least 8 hours.   BUN 53 (H) 6 - 20 mg/dL   Creatinine, Ser 9.53 (H) 0.44 - 1.00 mg/dL   Calcium 9.5 8.9 - 10.3 mg/dL   Total Protein 7.9 6.5 - 8.1 g/dL   Albumin 3.3 (L) 3.5 - 5.0 g/dL   AST 38 15 - 41 U/L   ALT 18 0 - 44 U/L   Alkaline Phosphatase 61 38 - 126 U/L   Total Bilirubin 0.9 0.3 - 1.2 mg/dL   GFR, Estimated 4 (L) >60 mL/min    Comment: (NOTE) Calculated using the CKD-EPI Creatinine Equation (2021)    Anion gap 28 (H) 5 - 15    Comment: Performed at [facility], [place] [address] [place],  [address]  Procalcitonin - Baseline     Status: None   Collection Time: 11/08/20  2:04 PM  Result Value Ref Range   Procalcitonin 14.27 ng/mL    Comment:        Interpretation: PCT >= 10 ng/mL: Important systemic inflammatory response, almost exclusively due to severe bacterial sepsis or septic shock. (NOTE)       Sepsis PCT Algorithm           Lower Respiratory Tract                                      Infection PCT Algorithm    ----------------------------     ----------------------------         PCT <  0.25 ng/mL                PCT < 0.10 ng/mL          Strongly encourage  Strongly discourage   discontinuation of antibiotics    initiation of antibiotics    ----------------------------     -----------------------------       PCT 0.25 - 0.50 ng/mL            PCT 0.10 - 0.25 ng/mL               OR       >80% decrease in PCT            Discourage initiation of                                            antibiotics      Encourage discontinuation           of antibiotics    ----------------------------     -----------------------------         PCT >= 0.50 ng/mL              PCT 0.26 - 0.50 ng/mL                AND       <80% decrease in PCT             Encourage initiation of                                             antibiotics       Encourage continuation           of antibiotics    ----------------------------     -----------------------------        PCT >= 0.50 ng/mL                  PCT > 0.50 ng/mL               AND         increase in PCT                  Strongly encourage                                      initiation of antibiotics    Strongly encourage escalation           of antibiotics                                     -----------------------------                                           PCT <= 0.25 ng/mL                                                 OR                                        >  80% decrease in PCT                                      Discontinue / Do not initiate                                             antibiotics  Performed at [facility], [place] [address], [place], [place] [address]   Magnesium     Status: Abnormal   Collection Time: 11/08/20  2:04 PM  Result Value Ref Range   Magnesium 2.6 (H) 1.7 - 2.4 mg/dL    Comment: Performed at [facility], [place] [address] [place], [place] [address]  SARS Coronavirus 2 by RT PCR (hospital order, performed in [facility] hospital lab) Nasopharyngeal Nasopharyngeal Swab      Status: Abnormal   Collection Time: 11/08/20  6:14 PM   Specimen: Nasopharyngeal Swab  Result Value Ref Range   SARS Coronavirus 2 POSITIVE (A) NEGATIVE    Comment: RESULT CALLED TO, READ BACK BY AND VERIFIED WITH: Diannia Ruder RN 11/08/20 2359 JDW (NOTE) SARS-CoV-2 target nucleic acids are DETECTED  SARS-CoV-2 RNA is generally detectable in upper respiratory specimens  during the acute phase of infection.  Positive results are indicative  of the presence of the identified virus, but do not rule out bacterial infection or co-infection with other pathogens not detected by the test.  Clinical correlation with patient history and  other diagnostic information is necessary to determine patient infection status.  The expected result is negative.  Fact Sheet for Patients:   StrictlyIdeas.no   Fact Sheet for Healthcare Providers:   BankingDealers.co.za    This test is not yet approved or cleared by the [place] FDA and  has been authorized for detection and/or diagnosis of SARS-CoV-2 by FDA under an Emergency Use Authorization (EUA).  This EUA will remain in effect (meaning this test can  be used) for the duration of  the COVID-19 declaration under Section 564(b)(1) of the Act, 21 U.S.C. section 360-bbb-3(b)(1), unless the authorization is terminated or revoked sooner.  Performed at [facility], [place] [address] [place], [place] [address]   Lipase, blood     Status: None   Collection Time: 11/08/20  6:22 PM  Result Value Ref Range   Lipase 21 11 - 51 U/L    Comment: Performed at [facility], [place] [address] [place], [place] [address]  Ferritin     Status: Abnormal   Collection Time: 11/09/20 12:07 AM  Result Value Ref Range   Ferritin 3,062 (H) 11 - 307 ng/mL    Comment: Performed at [facility], [place] [address] [place], [place] [address]  Fibrinogen     Status: Abnormal   Collection Time: 11/09/20 12:07 AM   Result Value Ref Range   Fibrinogen 633 (H) 210 - 475 mg/dL    Comment: Performed at [place] [address], [place], [place] [address]  D-dimer, quantitative (not at [facility])     Status: Abnormal   Collection Time: 11/09/20 12:07 AM  Result Value Ref Range   D-Dimer, Quant 1.91 (H) 0.00 - 0.50 ug/mL-FEU    Comment: (NOTE) At the manufacturer cut-off value of 0.5 g/mL FEU, this assay has  a negative predictive value of 95-100%.This assay is intended for use in conjunction with a clinical pretest probability (PTP) assessment model to exclude pulmonary embolism (PE) and deep venous thrombosis (DVT) in outpatients suspected of PE or DVT. Results should be correlated with clinical presentation. Performed at [facility], [place] [address], [place], [place] [address]   C-reactive protein     Status: Abnormal   Collection Time: 11/09/20 12:07 AM  Result Value Ref Range   CRP 23.2 (H) <1.0 mg/dL    Comment: Performed at [place] [address] [place], [place] [address]  Lactate dehydrogenase     Status: Abnormal   Collection Time: 11/09/20 12:07 AM  Result Value Ref Range   LDH 258 (H) 98 - 192 U/L    Comment: Performed at [facility], [place] [address], [place], [place] [address]  Triglycerides     Status: None   Collection Time: 11/09/20 12:07 AM  Result Value Ref Range   Triglycerides 112 <150 mg/dL    Comment: Performed at [facility] [address] [place], [place] [address]    DG Chest 2 View  Result Date: 11/08/2020 CLINICAL DATA:  Nausea x1 day. EXAM: CHEST - 2 VIEW COMPARISON:  October 15, 2020 FINDINGS: The lateral views limited in evaluation secondary to positioning of the patient's upper extremities. There is stable right-sided venous catheter positioning. Tubing from a ventriculoperitoneal shunt is also seen. Mild atelectasis and/or infiltrate is seen within the retrocardiac region of the left lung base. There is no evidence of a  pleural effusion or pneumothorax. The cardiac silhouette is mildly enlarged. Marked severity calcification of the thoracic aorta is seen. The visualized skeletal structures are unremarkable. IMPRESSION: Mild left basilar atelectasis and/or infiltrate. Electronically Signed   By: Virgina Norfolk M.D.   On: 11/08/2020 19:52   DG Wrist 2 Views Left  Result Date: 11/09/2020 CLINICAL DATA:  Fall EXAM: LEFT WRIST - 2 VIEW COMPARISON:  None. FINDINGS: There is a comminuted impacted distal radius fracture with slight posterior angulation. There is a non displaced ulnar styloid fracture. Overlying soft tissue swelling is seen. IMPRESSION: Comminuted impacted distal radius fracture. Nondisplaced ulnar styloid fracture. Electronically Signed   By: Prudencio Pair M.D.   On: 11/09/2020 02:22   CT ABDOMEN PELVIS W CONTRAST  Result Date: 11/08/2020 CLINICAL DATA:  Epigastric pain EXAM: CT ABDOMEN AND PELVIS WITH CONTRAST TECHNIQUE: Multidetector CT imaging of the abdomen and pelvis was performed using the standard protocol following bolus administration of intravenous contrast. CONTRAST:  10m OMNIPAQUE IOHEXOL 300 MG/ML  SOLN COMPARISON:  None. FINDINGS: Lower chest: The visualized heart size within normal limits. No pericardial fluid/thickening. No hiatal hernia. A small left pleural effusion is seen. Patchy airspace opacity seen at the left lung base. Hepatobiliary: Multiple low-density lesions are seen throughout the liver parenchyma the largest measuring 1.2 cm in the superior right liver lobe.The main portal vein is patent. No evidence of calcified gallstones, gallbladder wall thickening or biliary dilatation. Pancreas: There is mild pancreatic ductal dilatation measuring up to 2 mm. This is nonspecific however. Spleen: Normal in size without focal abnormality. Adrenals/Urinary Tract: Both adrenal glands appear normal. Mild bilateral renal atrophy seen. There is scattered calcification the largest measuring 3 mm in  the midpole of the right kidney. No hydronephrosis. Bladder is unremarkable. Stomach/Bowel: The stomach, small bowel, are normal in appearance. There is scattered colonic diverticula. There is a moderately distended rectal fecal ball. Vascular/Lymphatic: There  are no enlarged mesenteric, retroperitoneal, or pelvic lymph nodes. Scattered aortic atherosclerotic calcifications are seen without aneurysmal dilatation. Reproductive: The patient is status post hysterectomy. No adnexal masses or collections seen. Other: No evidence of abdominal wall mass or hernia. Musculoskeletal: No acute or significant osseous findings. IMPRESSION: Small left pleural effusion with patchy airspace opacity at the left lung base which may be due to atelectasis and/or infectious etiology Nonspecific mild pancreatic ductal dilatation Diverticulosis without diverticulitis Moderately dilated rectum with a fecal ball Aortic Atherosclerosis (ICD10-I70.0). Electronically Signed   By: Prudencio Pair M.D.   On: 11/08/2020 20:34   [place] EKG SITE RITE  Result Date: 11/09/2020 If Site Rite image not attached, placement could not be confirmed due to current cardiac rhythm.   Review of Systems  Unable to perform ROS: Mental status change   Blood pressure 117/63, pulse 68, temperature (!) 97.5 F (36.4 C), temperature source Oral, resp. rate 19, SpO2 100 %. Physical Exam Constitutional:      General: She is not in acute distress.    Appearance: She is well-developed and well-nourished. She is not diaphoretic.  HENT:     Head: Normocephalic and atraumatic.  Eyes:     General: No scleral icterus.       Right eye: No discharge.        Left eye: No discharge.     Conjunctiva/sclera: Conjunctivae normal.  Cardiovascular:     Rate and Rhythm: Normal rate and regular rhythm.  Pulmonary:     Effort: Pulmonary effort is normal. No respiratory distress.  Musculoskeletal:     Cervical back: Normal range of motion.     Comments: Left shoulder,  elbow, wrist, digits- no skin wounds, volar wrist splint in place, no instability, no blocks to motion  Sens  Ax/R/M grossly intact, ulnar possibly absent or paresthetic  Mot   Ax/ R/ PIN/ M/ AIN/ U absent  Cap refill <2s  Skin:    General: Skin is warm and dry.  Neurological:     Mental Status: She is alert.  Psychiatric:        Mood and Affect: Mood and affect normal.        Behavior: Behavior normal.     Assessment/Plan: Left wrist fx -- Continue splint, NWB for now. Will need ORIF once she is more stable, would ideally like to accomplish before the end of next week. Asked RN to remove rings from pt's affected hand. Multiple medical problems including ESRD on HD TTS, CAD, hypertension, hyperlipidemia, paroxysmal A. 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

## 2020-11-09 NOTE — Progress Notes (Signed)
Patient arrived to unit in bed accompanied by ER nurse. Pt has splint to left arm due to wrist broke which was discovered in ER. Pt is covid +. Airborne and contact  precautions initiated. Pt is on room air at 100%. She is on a cardizem drip. HR currently 78. CHG bath complete. She is currently resting in bed in no acute distress. Fuller Canada, RN

## 2020-11-10 DIAGNOSIS — R1084 Generalized abdominal pain: Secondary | ICD-10-CM | POA: Diagnosis not present

## 2020-11-10 DIAGNOSIS — R1013 Epigastric pain: Secondary | ICD-10-CM | POA: Diagnosis not present

## 2020-11-10 DIAGNOSIS — R112 Nausea with vomiting, unspecified: Secondary | ICD-10-CM | POA: Diagnosis not present

## 2020-11-10 DIAGNOSIS — I4891 Unspecified atrial fibrillation: Secondary | ICD-10-CM | POA: Diagnosis not present

## 2020-11-10 LAB — COMPREHENSIVE METABOLIC PANEL
ALT: 16 U/L (ref 0–44)
AST: 20 U/L (ref 15–41)
Albumin: 2.3 g/dL — ABNORMAL LOW (ref 3.5–5.0)
Alkaline Phosphatase: 83 U/L (ref 38–126)
Anion gap: 21 — ABNORMAL HIGH (ref 5–15)
BUN: 80 mg/dL — ABNORMAL HIGH (ref 6–20)
CO2: 21 mmol/L — ABNORMAL LOW (ref 22–32)
Calcium: 8.8 mg/dL — ABNORMAL LOW (ref 8.9–10.3)
Chloride: 91 mmol/L — ABNORMAL LOW (ref 98–111)
Creatinine, Ser: 10.53 mg/dL — ABNORMAL HIGH (ref 0.44–1.00)
GFR, Estimated: 4 mL/min — ABNORMAL LOW (ref >60)
Glucose, Bld: 101 mg/dL — ABNORMAL HIGH (ref 70–99)
Potassium: 4.3 mmol/L (ref 3.5–5.1)
Sodium: 133 mmol/L — ABNORMAL LOW (ref 135–145)
Total Bilirubin: 0.8 mg/dL (ref 0.3–1.2)
Total Protein: 5.4 g/dL — ABNORMAL LOW (ref 6.5–8.1)

## 2020-11-10 LAB — CBC WITH DIFFERENTIAL/PLATELET
Abs Immature Granulocytes: 0.03 10*3/uL (ref 0.00–0.07)
Basophils Absolute: 0 10*3/uL (ref 0.0–0.1)
Basophils Relative: 0 %
Eosinophils Absolute: 0 10*3/uL (ref 0.0–0.5)
Eosinophils Relative: 0 %
HCT: 29.1 % — ABNORMAL LOW (ref 36.0–46.0)
Hemoglobin: 9.7 g/dL — ABNORMAL LOW (ref 12.0–15.0)
Immature Granulocytes: 1 %
Lymphocytes Relative: 13 %
Lymphs Abs: 0.8 10*3/uL (ref 0.7–4.0)
MCH: 28 pg (ref 26.0–34.0)
MCHC: 33.3 g/dL (ref 30.0–36.0)
MCV: 84.1 fL (ref 80.0–100.0)
Monocytes Absolute: 0.3 10*3/uL (ref 0.1–1.0)
Monocytes Relative: 5 %
Neutro Abs: 5 10*3/uL (ref 1.7–7.7)
Neutrophils Relative %: 81 %
Platelets: 239 10*3/uL (ref 150–400)
RBC: 3.46 MIL/uL — ABNORMAL LOW (ref 3.87–5.11)
RDW: 17.9 % — ABNORMAL HIGH (ref 11.5–15.5)
WBC: 6.1 10*3/uL (ref 4.0–10.5)
nRBC: 0 % (ref 0.0–0.2)

## 2020-11-10 LAB — BPAM RBC
Blood Product Expiration Date: 202202112359
Blood Product Expiration Date: 202202112359
ISSUE DATE / TIME: 202201211312
ISSUE DATE / TIME: 202201211718
Unit Type and Rh: 6200
Unit Type and Rh: 6200

## 2020-11-10 LAB — TYPE AND SCREEN
ABO/RH(D): A POS
Antibody Screen: NEGATIVE
Unit division: 0
Unit division: 0

## 2020-11-10 LAB — MAGNESIUM: Magnesium: 2.4 mg/dL (ref 1.7–2.4)

## 2020-11-10 LAB — PROCALCITONIN: Procalcitonin: 31.19 ng/mL

## 2020-11-10 LAB — GLUCOSE, CAPILLARY: Glucose-Capillary: 91 mg/dL (ref 70–99)

## 2020-11-10 LAB — BRAIN NATRIURETIC PEPTIDE: B Natriuretic Peptide: 2805.6 pg/mL — ABNORMAL HIGH (ref 0.0–100.0)

## 2020-11-10 LAB — C-REACTIVE PROTEIN: CRP: 17.7 mg/dL — ABNORMAL HIGH (ref <1.0)

## 2020-11-10 LAB — D-DIMER, QUANTITATIVE: D-Dimer, Quant: 1.45 ug/mL-FEU — ABNORMAL HIGH (ref 0.00–0.50)

## 2020-11-10 LAB — LIPASE, BLOOD: Lipase: 132 U/L — ABNORMAL HIGH (ref 11–51)

## 2020-11-10 MED ORDER — BISACODYL 5 MG PO TBEC
10.0000 mg | DELAYED_RELEASE_TABLET | Freq: Every day | ORAL | Status: DC
  Administered 2020-11-10 – 2020-11-11 (×2): 10 mg via ORAL
  Filled 2020-11-10 (×2): qty 2

## 2020-11-10 MED ORDER — LIDOCAINE HCL (PF) 1 % IJ SOLN
INTRAMUSCULAR | Status: AC
  Filled 2020-11-10: qty 5

## 2020-11-10 MED ORDER — SODIUM CHLORIDE 0.9 % IV SOLN
1.0000 g | INTRAVENOUS | Status: DC
  Administered 2020-11-12 – 2020-11-15 (×4): 1 g via INTRAVENOUS
  Filled 2020-11-10 (×5): qty 1

## 2020-11-10 MED ORDER — POLYETHYLENE GLYCOL 3350 17 G PO PACK
17.0000 g | PACK | Freq: Two times a day (BID) | ORAL | Status: DC
  Administered 2020-11-10 – 2020-11-26 (×20): 17 g via ORAL
  Filled 2020-11-10 (×23): qty 1

## 2020-11-10 MED ORDER — SODIUM CHLORIDE 0.9% FLUSH
10.0000 mL | Freq: Two times a day (BID) | INTRAVENOUS | Status: DC
  Administered 2020-11-10 – 2020-11-11 (×3): 10 mL

## 2020-11-10 MED ORDER — SODIUM CHLORIDE 0.9 % IV SOLN
1.0000 g | Freq: Once | INTRAVENOUS | Status: AC
  Administered 2020-11-11: 1 g via INTRAVENOUS
  Filled 2020-11-10: qty 1

## 2020-11-10 MED ORDER — SODIUM CHLORIDE 0.9% FLUSH: 10.0000 mL | INTRAVENOUS | Status: DC | PRN

## 2020-11-10 NOTE — Progress Notes (Signed)
PROGRESS NOTE                                                                                                                                                                                                             Patient Demographics:    [name], is a 60 y.o. female, DOB - 1961/10/17, [id number]  Outpatient Primary MD for the patient is Clovia Cuff, MD    LOS - 1  Admit date - 11/08/2020    Chief Complaint  Patient presents with  . Nausea       Brief Narrative (HPI from H&P)  [name] is a 60 y.o. female with medical history significant of ESRD on HD TTS, CAD, hypertension, hyperlipidemia, paroxysmal A. 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  She has not been vaccinated for COVID-19, in the ER she was found to be in A. fib with RVR, skipped dialysis treatment, questionable COVID-pneumonia, soon she developed worsening abdominal pain and a rapid drop in her H&H.  Currently nephrology, GI, critical care have been consulted.  Patient is extremely tenuous.   Subjective:   Patient in bed, denies any headache or chest pain, abdominal pain has improved, has pain in her left wrist, no shortness of breath.  No nausea vomiting.   Assessment  & Plan :     1. Acute COVID-19 infection with possible early pneumonia in a patient who is not vaccinated - she is currently not hypoxic and this seems to be a secondary issue, will continue to monitor.  Encouraged the patient to sit up in chair in the daytime use I-S and flutter valve for pulmonary toiletry and then prone in bed when at night.  Will advance activity and titrate down oxygen as possible.  SpO2: 96 %  Recent Labs  Lab 11/08/20 1404 11/08/20 1814 11/09/20 0007  11/09/20 0806 11/09/20 1008 11/09/20 1340 11/09/20 2241 11/10/20 0431  WBC 6.8  --   --  5.9  --   --   --  6.1  HGB 11.0*  --   --  6.8* 6.5*  --  10.7* 9.7*  HCT 33.5*  --   --  20.6* 20.0*  --  31.8* 29.1*  PLT 287  --   --  255  --  249  --  239  CRP  --   --  23.2* 23.2*  --   --   --  17.7*  BNP  --   --   --  3,445.5*  --   --   --  2,805.6*  DDIMER  --   --  1.91* 2.21*  --  1.92*  --  1.45*  PROCALCITON 14.27  --   --  29.06  --   --   --  31.19  AST 38  --   --  30  --   --   --  20  ALT 18  --   --  18  --   --   --  16  ALKPHOS 61  --   --  55  --   --   --  83  BILITOT 0.9  --   --  0.7  --   --   --  0.8  ALBUMIN 3.3*  --   --  2.6*  --   --   --  2.3*  INR  --   --   --   --   --  1.3*  --   --   SARSCOV2NAA  --  POSITIVE*  --   --   --   --   --   --     2. Epigastric abdominal pain ongoing for 12 hours with a drop in H&H.  On IV PPI, CT scan done few hours ago was nonacute, KUB this morning does not show any free air under the diaphragm.  Her abdomen is tender on exam, GI is following, will repeat CT scan to rule out retroperitoneal bleed.  Lipase has bumped on 11/10/2020 but less than 3X from top normal, will defer this to GI.  LFTs are stable, CT did not show any pancreatic enhancement.  3. acute on chronic anemia.  Baseline anemia of chronic disease due to ESRD, acute drop unexplained, did have significant abdominal discomfort, CT scan abdomen pelvis negative x2 this admission for retroperitoneal bleed or acute changes, GI on board, status post 2 units of packed RBC transfusion on 11/09/2020 with stable posttransfusion H&H, on PPI IV which will be continued, no signs of ongoing bleed. Stable  DIC panel.  4. ESRD.  On TTS schedule, missed dialysis on 11/08/2020.  Renal consulted.  5. left wrist pain and fracture.  Maintain splint, Ortho consulted on 11/09/2020, still has some pain and reduced range of motion in her left fingers, Ortho following and informed.  Patient tells  me on 11/10/2020 that she fell at home 3 to 4 weeks ago and that is when she hurt her wrist.  6. lack of IV access.  PCCM consulted for line placement.  Right femoral line placed on 11/09/2020.  7. history of seizures.  On Keppra.  8. A. fib RVR.  Has underlying paroxysmal A. fib, not on anticoagulation due to previous history of subarachnoid hemorrhage, [place] vas 2 score of greater than 3.  Currently on IV and oral Cardizem as needed.  Aspirin once she is stable.  9. generalized weakness.  Supportive care.  PT OT.   10.  Large stool burden, disimpaction, bowel regimen.       Condition - Extremely Guarded  Family Communication  : daughter [name]  [phone] - 11/09/20 , 11/10/20 message left at 9:55 AM  Code Status :  Full  Consults  :  Renal, PCCM, GI, Ortho  Procedures  :    R. Femoral line placement by PCCM on 11/09/2020.    CT - Small left pleural effusion with patchy airspace opacity at the left lung base which may be due to atelectasis and/or infectious etiology Nonspecific mild pancreatic ductal dilatation Diverticulosis without diverticulitis Moderately dilated rectum with a fecal ball Aortic Atherosclerosis   PUD Prophylaxis : IV PPI  Disposition Plan  :    Status is: Inpt  Dispo: The patient is from: Home              Anticipated d/c is to: Home              Anticipated d/c date is: > 3 days              Patient currently is not medically stable to d/c.  DVT Prophylaxis  : SCDs   Lab Results  Component Value Date   PLT 239 11/10/2020    Diet :  Diet Order            Diet clear liquid Room service appropriate? Yes; Fluid consistency: Thin  Diet effective now                  Inpatient Medications  Scheduled Meds: . bisacodyl  10 mg Rectal Daily  . calcitRIOL  1 mcg Oral Q T,Th,Sa-HD  . Chlorhexidine Gluconate Cloth  6 each Topical Daily  . Chlorhexidine Gluconate Cloth  6 each Topical Q0600  . darbepoetin (ARANESP) injection - NON-DIALYSIS  100  mcg Subcutaneous Q Sat-1800  . pantoprazole (PROTONIX) IV  40 mg Intravenous Q12H  . sodium chloride flush  10-40 mL Intracatheter Q12H   Continuous Infusions: . levETIRAcetam Stopped (11/10/20 0022)  . [START ON 11/11/2020] levofloxacin (LEVAQUIN) IV    . remdesivir 100 mg in NS 100 mL 100 mg (11/10/20 0910)   PRN Meds:.[DISCONTINUED] acetaminophen **OR** acetaminophen, fentaNYL (SUBLIMAZE) injection, prochlorperazine, sodium chloride flush  Antibiotics  :    Anti-infectives (From admission, onward)   Start     Dose/Rate Route Frequency Ordered Stop   11/11/20 0600  levofloxacin (LEVAQUIN) IVPB 500 mg        500 mg 100 mL/hr over 60 Minutes Intravenous Every 48 hours 11/09/20 0413     11/10/20 1000  remdesivir 100 mg in sodium chloride 0.9 % 100 mL IVPB       "Followed by" Linked Group Details   100 mg 200 mL/hr over 30 Minutes Intravenous Daily 11/09/20 0023 11/14/20 0959   11/09/20 0500  levofloxacin (LEVAQUIN) IVPB 750 mg        750 mg 100 mL/hr over 90 Minutes Intravenous  Once 11/09/20 0413 11/09/20 1504   11/09/20 0100  remdesivir 200 mg in sodium chloride 0.9% 250 mL IVPB       "Followed by" Linked Group Details   200 mg 580 mL/hr over 30 Minutes Intravenous Once 11/09/20 0023 11/09/20 1046       Time Spent in minutes  30   Lala Lund M.D on 11/10/2020 at 9:55 AM  To page go to [email or web address]   [facility] -  Office  [phone]  See all Orders from today for further details    Objective:   Vitals:   11/10/20 0500 11/10/20 0600 11/10/20 0700 11/10/20 0800  BP: (!) 114/50 115/62 (!) 103/51 103/66  Pulse: 68 70 71 70  Resp: (!) 21 (!) '23 19 15  '$ Temp:  TempSrc:      SpO2: 98% 98% 95% 96%  Weight: 44.8 kg       Wt Readings from Last 3 Encounters:  11/10/20 44.8 kg  10/26/20 40.7 kg  10/17/20 42.5 kg     Intake/Output Summary (Last 24 hours) at 11/10/2020 0955 Last data filed at 11/10/2020 0500 Gross per 24 hour  Intake 1706.47 ml   Output -  Net 1706.47 ml     Physical Exam  Awake is confused, moving all 4 extremities but reduced range of motion noted in the left fingers, left wrist in splint, right femoral line, right IJ dialysis catheter. [place].AT,PERRAL Supple Neck,No JVD, No cervical lymphadenopathy appriciated.  Symmetrical Chest wall movement, Good air movement bilaterally, CTAB RRR,No Gallops, Rubs or new Murmurs, No Parasternal Heave +ve B.Sounds, Abd Soft, mild epigastric tenderness no Cyanosis, Clubbing or edema, No new Rash or bruise    Data Review:    CBC Recent Labs  Lab 11/08/20 1404 11/09/20 0806 11/09/20 1008 11/09/20 1340 11/09/20 2241 11/10/20 0431  WBC 6.8 5.9  --   --   --  6.1  HGB 11.0* 6.8* 6.5*  --  10.7* 9.7*  HCT 33.5* 20.6* 20.0*  --  31.8* 29.1*  PLT 287 255  --  249  --  239  MCV 90.1 88.0  --   --   --  84.1  MCH 29.6 29.1  --   --   --  28.0  MCHC 32.8 33.0  --   --   --  33.3  RDW 15.8* 15.8*  --   --   --  17.9*  LYMPHSABS  --   --   --   --   --  0.8  MONOABS  --   --   --   --   --  0.3  EOSABS  --   --   --   --   --  0.0  BASOSABS  --   --   --   --   --  0.0    Recent Labs  Lab 11/08/20 1404 11/09/20 0007 11/09/20 0806 11/09/20 1340 11/10/20 0431  NA 140  --  134*  --  133*  K 4.1  --  4.1  --  4.3  CL 91*  --  88*  --  91*  CO2 21*  --  24  --  21*  GLUCOSE 136*  --  98  --  101*  BUN 53*  --  71*  --  80*  CREATININE 9.53*  --  10.36*  --  10.53*  CALCIUM 9.5  --  8.8*  --  8.8*  AST 38  --  30  --  20  ALT 18  --  18  --  16  ALKPHOS 61  --  55  --  83  BILITOT 0.9  --  0.7  --  0.8  ALBUMIN 3.3*  --  2.6*  --  2.3*  MG 2.6*  --  2.5*  --  2.4  CRP  --  23.2* 23.2*  --  17.7*  DDIMER  --  1.91* 2.21* 1.92* 1.45*  PROCALCITON 14.27  --  29.06  --  31.19  INR  --   --   --  1.3*  --   BNP  --   --  3,445.5*  --  2,805.6*     ------------------------------------------------------------------------------------------------------------------ Recent Labs    11/09/20 0007  TRIG 112    Lab  Results  Component Value Date   HGBA1C 5.2 10/07/2020   ------------------------------------------------------------------------------------------------------------------ No results for input(s): TSH, T4TOTAL, T3FREE, THYROIDAB in the last 72 hours.  Invalid input(s): FREET3  Cardiac Enzymes No results for input(s): CKMB, TROPONINI, MYOGLOBIN in the last 168 hours.  Invalid input(s): CK ------------------------------------------------------------------------------------------------------------------    Component Value Date/Time   BNP 2,805.6 (H) 11/10/2020 0431    Micro Results Recent Results (from the past 240 hour(s))  SARS Coronavirus 2 by RT PCR (hospital order, performed in [facility] hospital lab) Nasopharyngeal Nasopharyngeal Swab     Status: Abnormal   Collection Time: 11/08/20  6:14 PM   Specimen: Nasopharyngeal Swab  Result Value Ref Range Status   SARS Coronavirus 2 POSITIVE (A) NEGATIVE Final    Comment: RESULT CALLED TO, READ BACK BY AND VERIFIED WITH: Diannia Ruder RN 11/08/20 2359 JDW (NOTE) SARS-CoV-2 target nucleic acids are DETECTED  SARS-CoV-2 RNA is generally detectable in upper respiratory specimens  during the acute phase of infection.  Positive results are indicative  of the presence of the identified virus, but do not rule out bacterial infection or co-infection with other pathogens not detected by the test.  Clinical correlation with patient history and  other diagnostic information is necessary to determine patient infection status.  The expected result is negative.  Fact Sheet for Patients:   StrictlyIdeas.no   Fact Sheet for Healthcare Providers:   BankingDealers.co.za    This test is not yet approved or cleared by the [place]  FDA and  has been authorized for detection and/or diagnosis of SARS-CoV-2 by FDA under an Emergency Use Authorization (EUA).  This EUA will remain in effect (meaning this test can  be used) for the duration of  the COVID-19 declaration under Section 564(b)(1) of the Act, 21 U.S.C. section 360-bbb-3(b)(1), unless the authorization is terminated or revoked sooner.  Performed at [facility], [place] [address] [place], [place] [address]     Radiology Reports CT ABDOMEN PELVIS WO CONTRAST  Result Date: 11/09/2020 CLINICAL DATA:  Acute generalized abdominal pain. EXAM: CT ABDOMEN AND PELVIS WITHOUT CONTRAST TECHNIQUE: Multidetector CT imaging of the abdomen and pelvis was performed following the standard protocol without IV contrast. COMPARISON:  November 08, 2020. FINDINGS: Lower chest: Increased left basilar opacity is noted concerning for worsening pneumonia or atelectasis. Mild right posterior basilar subsegmental atelectasis is noted. Hepatobiliary: No gallstones or biliary dilatation is noted. Multiple rounded hepatic low densities are noted most consistent with cysts. Pancreas: No definite evidence of acute inflammation is noted. Stable mild nonspecific ductal dilatation is noted. Spleen: Normal in size without focal abnormality. Adrenals/Urinary Tract: Adrenal glands appear normal. Bilateral renal atrophy is noted consistent with end-stage renal disease. Left renal cysts are noted. No hydronephrosis or renal obstruction is noted. Urinary bladder is unremarkable. Stomach/Bowel: Stomach is within normal limits. Appendix appears normal. Large amount of stool is again noted in the rectum concerning for impaction. No abnormal bowel dilatation is otherwise noted. Vascular/Lymphatic: Aortic atherosclerosis. No enlarged abdominal or pelvic lymph nodes. Reproductive: Status post hysterectomy. No adnexal masses. Other: Ventriculoperitoneal shunt is noted with tip in left side of abdomen. No hernia or  ascites is noted. Musculoskeletal: No acute or significant osseous findings. IMPRESSION: 1. Increased left basilar opacity is noted concerning for worsening pneumonia or atelectasis. 2. Bilateral renal atrophy is noted consistent with end-stage renal disease. 3. Aortic atherosclerosis. 4. Large amount of stool is again noted in the rectum concerning for impaction. Aortic Atherosclerosis (ICD10-I70.0). Electronically Signed  By: Marijo Conception M.D.   On: 11/09/2020 15:05   DG Chest 2 View  Result Date: 11/08/2020 CLINICAL DATA:  Nausea x1 day. EXAM: CHEST - 2 VIEW COMPARISON:  October 15, 2020 FINDINGS: The lateral views limited in evaluation secondary to positioning of the patient's upper extremities. There is stable right-sided venous catheter positioning. Tubing from a ventriculoperitoneal shunt is also seen. Mild atelectasis and/or infiltrate is seen within the retrocardiac region of the left lung base. There is no evidence of a pleural effusion or pneumothorax. The cardiac silhouette is mildly enlarged. Marked severity calcification of the thoracic aorta is seen. The visualized skeletal structures are unremarkable. IMPRESSION: Mild left basilar atelectasis and/or infiltrate. Electronically Signed   By: Virgina Norfolk M.D.   On: 11/08/2020 19:52   DG Wrist 2 Views Left  Result Date: 11/09/2020 CLINICAL DATA:  Fall EXAM: LEFT WRIST - 2 VIEW COMPARISON:  None. FINDINGS: There is a comminuted impacted distal radius fracture with slight posterior angulation. There is a non displaced ulnar styloid fracture. Overlying soft tissue swelling is seen. IMPRESSION: Comminuted impacted distal radius fracture. Nondisplaced ulnar styloid fracture. Electronically Signed   By: Prudencio Pair M.D.   On: 11/09/2020 02:22   CT ABDOMEN PELVIS W CONTRAST  Result Date: 11/08/2020 CLINICAL DATA:  Epigastric pain EXAM: CT ABDOMEN AND PELVIS WITH CONTRAST TECHNIQUE: Multidetector CT imaging of the abdomen and pelvis was  performed using the standard protocol following bolus administration of intravenous contrast. CONTRAST:  68m OMNIPAQUE IOHEXOL 300 MG/ML  SOLN COMPARISON:  None. FINDINGS: Lower chest: The visualized heart size within normal limits. No pericardial fluid/thickening. No hiatal hernia. A small left pleural effusion is seen. Patchy airspace opacity seen at the left lung base. Hepatobiliary: Multiple low-density lesions are seen throughout the liver parenchyma the largest measuring 1.2 cm in the superior right liver lobe.The main portal vein is patent. No evidence of calcified gallstones, gallbladder wall thickening or biliary dilatation. Pancreas: There is mild pancreatic ductal dilatation measuring up to 2 mm. This is nonspecific however. Spleen: Normal in size without focal abnormality. Adrenals/Urinary Tract: Both adrenal glands appear normal. Mild bilateral renal atrophy seen. There is scattered calcification the largest measuring 3 mm in the midpole of the right kidney. No hydronephrosis. Bladder is unremarkable. Stomach/Bowel: The stomach, small bowel, are normal in appearance. There is scattered colonic diverticula. There is a moderately distended rectal fecal ball. Vascular/Lymphatic: There are no enlarged mesenteric, retroperitoneal, or pelvic lymph nodes. Scattered aortic atherosclerotic calcifications are seen without aneurysmal dilatation. Reproductive: The patient is status post hysterectomy. No adnexal masses or collections seen. Other: No evidence of abdominal wall mass or hernia. Musculoskeletal: No acute or significant osseous findings. IMPRESSION: Small left pleural effusion with patchy airspace opacity at the left lung base which may be due to atelectasis and/or infectious etiology Nonspecific mild pancreatic ductal dilatation Diverticulosis without diverticulitis Moderately dilated rectum with a fecal ball Aortic Atherosclerosis (ICD10-I70.0). Electronically Signed   By: BPrudencio PairM.D.   On:  11/08/2020 20:34   DG Chest Port 1 View  Result Date: 11/09/2020 CLINICAL DATA:  Shortness of breath EXAM: PORTABLE CHEST 1 VIEW COMPARISON:  November 08, 2020 FINDINGS: Central catheter tip is at the cavoatrial junction. There is a intraperitoneal shunt catheter extending along the medial right hemithorax, stable. There is ill-defined airspace opacity in the left lower lobe. Lungs elsewhere clear. Heart is mildly enlarged with pulmonary venous hypertension. There is aortic atherosclerosis. No adenopathy. There is a loop recorder on  the left. No bone lesions. IMPRESSION: Central catheter position unchanged. No pneumothorax. Airspace opacity likely representing pneumonia left lower lobe. Lungs elsewhere clear. There is mild cardiomegaly with a degree of pulmonary vascular congestion. Aortic Atherosclerosis (ICD10-I70.0). Electronically Signed   By: Lowella Grip III M.D.   On: 11/09/2020 08:37   DG Chest Port 1 View  Result Date: 10/15/2020 CLINICAL DATA:  Post dialysis catheter placement EXAM: PORTABLE CHEST 1 VIEW COMPARISON:  10/06/2020 FINDINGS: New right IJ tunneled dialysis catheter tip overlies the cavoatrial junction. Partially imaged shunt catheter traversing the right chest. No new consolidation. No pleural effusion or pneumothorax. Stable cardiomediastinal contours. IMPRESSION: New right IJ tunneled dialysis catheter tip overlies the cavoatrial junction. No pneumothorax. Electronically Signed   By: Macy Mis M.D.   On: 10/15/2020 13:22   DG Abd Portable 1V  Result Date: 11/09/2020 CLINICAL DATA:  Abdominal pain, anemia EXAM: PORTABLE ABDOMEN - 1 VIEW COMPARISON:  11/08/2020 CT abdomen/pelvis FINDINGS: No disproportionately dilated small bowel loops. Moderate rectal stool, similar. Mild to moderate colonic stool and gas. No evidence of pneumatosis or pneumoperitoneum. Right-sided VP shunt catheter terminates in the left abdomen and appears intact and without kink. No radiopaque  nephrolithiasis. IMPRESSION: Nonobstructive bowel gas pattern. Moderate rectal stool, similar to recent CT. Mild to moderate colonic stool and gas. Electronically Signed   By: Ilona Sorrel M.D.   On: 11/09/2020 10:11   DG Fluoro Guide CV Line-No Report  Result Date: 10/15/2020 Fluoroscopy was utilized by the requesting physician.  No radiographic interpretation.   [place] EKG SITE RITE  Result Date: 11/09/2020 If Site Rite image not attached, placement could not be confirmed due to current cardiac rhythm.

## 2020-11-10 NOTE — Progress Notes (Signed)
Orthopedic Tech Progress Note Patient Details:  [name] 19-Jul-1961 [id number] Went to apply a new splint to patient. Once I removed the ace wrap patient had on I saw she had on 2 rings. I was able to cut off one of the rings but I wasn't able to remove the second one. Notified RN now I'm reaching out to MD/PA and to see if they can remove the ring before I'm able to apply splint.  Patient ID: [name], female   DOB: September 06, 1961, 60 y.o.   MRN: [id number]   Janit Pagan 11/10/2020, 11:32 AM

## 2020-11-10 NOTE — Progress Notes (Addendum)
Daily Rounding Note  11/10/2020, 10:38 AM  LOS: 1 day   SUBJECTIVE:   Chief complaint:    Anemia.  Abdominal pain, fecal impaction. No further complaints of abdominal pain per the RN.  No stools.  Tolerating clear liquid diet.  No nausea or vomiting  OBJECTIVE:         Vital signs in last 24 hours:    Temp:  [97.5 F (36.4 C)-98.2 F (36.8 C)] 97.7 F (36.5 C) (01/22 0400) Pulse Rate:  [57-79] 69 (01/22 1000) Resp:  [11-30] 16 (01/22 1000) BP: (79-128)/(37-78) 115/65 (01/22 1000) SpO2:  [94 %-100 %] 97 % (01/22 1000) Weight:  [44.8 kg] 44.8 kg (01/22 0500) Last BM Date: 11/09/20 Filed Weights   11/10/20 0500  Weight: 44.8 kg   General: Chronically, acutely ill looking.  Alert.  Comfortable Heart: NSR in 70s on monitor. Chest: No labored breathing, no adventitious sounds, no cough on anterior exam. Abdomen: Mild left-sided tenderness without guarding or rebound.  Active bowel sounds.  Soft without distention. Extremities: No CCE. Neuro/Psych: Follows commands.  Moves all 4 limbs.  No tremors.  Oriented to self but not to year or place  Intake/Output from previous day: 01/21 0701 - 01/22 0700 In: 1706.5 [I.V.:110; Blood:1246.5; IV Piggyback:350] Out: -   Intake/Output this shift: No intake/output data recorded.  Lab Results: Recent Labs    11/08/20 1404 11/09/20 0806 11/09/20 1008 11/09/20 1340 11/09/20 2241 11/10/20 0431  WBC 6.8 5.9  --   --   --  6.1  HGB 11.0* 6.8* 6.5*  --  10.7* 9.7*  HCT 33.5* 20.6* 20.0*  --  31.8* 29.1*  PLT 287 255  --  249  --  239   BMET Recent Labs    11/08/20 1404 11/09/20 0806 11/10/20 0431  NA 140 134* 133*  K 4.1 4.1 4.3  CL 91* 88* 91*  CO2 21* 24 21*  GLUCOSE 136* 98 101*  BUN 53* 71* 80*  CREATININE 9.53* 10.36* 10.53*  CALCIUM 9.5 8.8* 8.8*   LFT Recent Labs    11/08/20 1404 11/09/20 0806 11/10/20 0431  PROT 7.9 5.9* 5.4*  ALBUMIN 3.3* 2.6* 2.3*   AST 38 30 20  ALT '18 18 16  '$ ALKPHOS 61 55 83  BILITOT 0.9 0.7 0.8  BILIDIR  --  0.1  --   IBILI  --  0.6  --    PT/INR Recent Labs    11/09/20 1340  LABPROT 16.1*  INR 1.3*   Hepatitis Panel No results for input(s): HEPBSAG, HCVAB, HEPAIGM, HEPBIGM in the last 72 hours.  Studies/Results: CT ABDOMEN PELVIS WO CONTRAST  Result Date: 11/09/2020 CLINICAL DATA:  Acute generalized abdominal pain. EXAM: CT ABDOMEN AND PELVIS WITHOUT CONTRAST TECHNIQUE: Multidetector CT imaging of the abdomen and pelvis was performed following the standard protocol without IV contrast. COMPARISON:  November 08, 2020. FINDINGS: Lower chest: Increased left basilar opacity is noted concerning for worsening pneumonia or atelectasis. Mild right posterior basilar subsegmental atelectasis is noted. Hepatobiliary: No gallstones or biliary dilatation is noted. Multiple rounded hepatic low densities are noted most consistent with cysts. Pancreas: No definite evidence of acute inflammation is noted. Stable mild nonspecific ductal dilatation is noted. Spleen: Normal in size without focal abnormality. Adrenals/Urinary Tract: Adrenal glands appear normal. Bilateral renal atrophy is noted consistent with end-stage renal disease. Left renal cysts are noted. No hydronephrosis or renal obstruction is noted. Urinary bladder is unremarkable. Stomach/Bowel: Stomach is  within normal limits. Appendix appears normal. Large amount of stool is again noted in the rectum concerning for impaction. No abnormal bowel dilatation is otherwise noted. Vascular/Lymphatic: Aortic atherosclerosis. No enlarged abdominal or pelvic lymph nodes. Reproductive: Status post hysterectomy. No adnexal masses. Other: Ventriculoperitoneal shunt is noted with tip in left side of abdomen. No hernia or ascites is noted. Musculoskeletal: No acute or significant osseous findings. IMPRESSION: 1. Increased left basilar opacity is noted concerning for worsening pneumonia or  atelectasis. 2. Bilateral renal atrophy is noted consistent with end-stage renal disease. 3. Aortic atherosclerosis. 4. Large amount of stool is again noted in the rectum concerning for impaction. Aortic Atherosclerosis (ICD10-I70.0). Electronically Signed   By: Marijo Conception M.D.   On: 11/09/2020 15:05   DG Chest 2 View  Result Date: 11/08/2020 CLINICAL DATA:  Nausea x1 day. EXAM: CHEST - 2 VIEW COMPARISON:  October 15, 2020 FINDINGS: The lateral views limited in evaluation secondary to positioning of the patient's upper extremities. There is stable right-sided venous catheter positioning. Tubing from a ventriculoperitoneal shunt is also seen. Mild atelectasis and/or infiltrate is seen within the retrocardiac region of the left lung base. There is no evidence of a pleural effusion or pneumothorax. The cardiac silhouette is mildly enlarged. Marked severity calcification of the thoracic aorta is seen. The visualized skeletal structures are unremarkable. IMPRESSION: Mild left basilar atelectasis and/or infiltrate. Electronically Signed   By: Virgina Norfolk M.D.   On: 11/08/2020 19:52   DG Wrist 2 Views Left  Result Date: 11/09/2020 CLINICAL DATA:  Fall EXAM: LEFT WRIST - 2 VIEW COMPARISON:  None. FINDINGS: There is a comminuted impacted distal radius fracture with slight posterior angulation. There is a non displaced ulnar styloid fracture. Overlying soft tissue swelling is seen. IMPRESSION: Comminuted impacted distal radius fracture. Nondisplaced ulnar styloid fracture. Electronically Signed   By: Prudencio Pair M.D.   On: 11/09/2020 02:22   CT ABDOMEN PELVIS W CONTRAST  Result Date: 11/08/2020 CLINICAL DATA:  Epigastric pain EXAM: CT ABDOMEN AND PELVIS WITH CONTRAST TECHNIQUE: Multidetector CT imaging of the abdomen and pelvis was performed using the standard protocol following bolus administration of intravenous contrast. CONTRAST:  67m OMNIPAQUE IOHEXOL 300 MG/ML  SOLN COMPARISON:  None. FINDINGS:  Lower chest: The visualized heart size within normal limits. No pericardial fluid/thickening. No hiatal hernia. A small left pleural effusion is seen. Patchy airspace opacity seen at the left lung base. Hepatobiliary: Multiple low-density lesions are seen throughout the liver parenchyma the largest measuring 1.2 cm in the superior right liver lobe.The main portal vein is patent. No evidence of calcified gallstones, gallbladder wall thickening or biliary dilatation. Pancreas: There is mild pancreatic ductal dilatation measuring up to 2 mm. This is nonspecific however. Spleen: Normal in size without focal abnormality. Adrenals/Urinary Tract: Both adrenal glands appear normal. Mild bilateral renal atrophy seen. There is scattered calcification the largest measuring 3 mm in the midpole of the right kidney. No hydronephrosis. Bladder is unremarkable. Stomach/Bowel: The stomach, small bowel, are normal in appearance. There is scattered colonic diverticula. There is a moderately distended rectal fecal ball. Vascular/Lymphatic: There are no enlarged mesenteric, retroperitoneal, or pelvic lymph nodes. Scattered aortic atherosclerotic calcifications are seen without aneurysmal dilatation. Reproductive: The patient is status post hysterectomy. No adnexal masses or collections seen. Other: No evidence of abdominal wall mass or hernia. Musculoskeletal: No acute or significant osseous findings. IMPRESSION: Small left pleural effusion with patchy airspace opacity at the left lung base which may be due to atelectasis  and/or infectious etiology Nonspecific mild pancreatic ductal dilatation Diverticulosis without diverticulitis Moderately dilated rectum with a fecal ball Aortic Atherosclerosis (ICD10-I70.0). Electronically Signed   By: Prudencio Pair M.D.   On: 11/08/2020 20:34   DG Chest Port 1 View  Result Date: 11/09/2020 CLINICAL DATA:  Shortness of breath EXAM: PORTABLE CHEST 1 VIEW COMPARISON:  November 08, 2020 FINDINGS:  Central catheter tip is at the cavoatrial junction. There is a intraperitoneal shunt catheter extending along the medial right hemithorax, stable. There is ill-defined airspace opacity in the left lower lobe. Lungs elsewhere clear. Heart is mildly enlarged with pulmonary venous hypertension. There is aortic atherosclerosis. No adenopathy. There is a loop recorder on the left. No bone lesions. IMPRESSION: Central catheter position unchanged. No pneumothorax. Airspace opacity likely representing pneumonia left lower lobe. Lungs elsewhere clear. There is mild cardiomegaly with a degree of pulmonary vascular congestion. Aortic Atherosclerosis (ICD10-I70.0). Electronically Signed   By: Lowella Grip III M.D.   On: 11/09/2020 08:37   DG Abd Portable 1V  Result Date: 11/09/2020 CLINICAL DATA:  Abdominal pain, anemia EXAM: PORTABLE ABDOMEN - 1 VIEW COMPARISON:  11/08/2020 CT abdomen/pelvis FINDINGS: No disproportionately dilated small bowel loops. Moderate rectal stool, similar. Mild to moderate colonic stool and gas. No evidence of pneumatosis or pneumoperitoneum. Right-sided VP shunt catheter terminates in the left abdomen and appears intact and without kink. No radiopaque nephrolithiasis. IMPRESSION: Nonobstructive bowel gas pattern. Moderate rectal stool, similar to recent CT. Mild to moderate colonic stool and gas. Electronically Signed   By: Ilona Sorrel M.D.   On: 11/09/2020 10:11   [place] EKG SITE RITE  Result Date: 11/09/2020 If Site Rite image not attached, placement could not be confirmed due to current cardiac rhythm.   ASSESMENT:   *   Nausea, vomiting, upper abdominal pain. Stool nonbloody, nonmelenic. No evidence for retroperitoneal or intra-abdominal bleeding on 2 recent CTAP's, 1 with, 1 without contrast as well as KUB Fecal impaction on exam. Bisacodyl PO given this morning.  TID MiraLAX initiated this afternoon . *    Acute anemia.  Undetermined source.  Suspect baseline chronic disease  anemia due to her ESRD. Hgb 11 >> 6.8 >> 6.5 >> 2 PRBCs >> 10.7 >> 9.7.  *    COVID-19 pneumonia.  *   History of CVA, cerebral aneurysm/subarachnoid hemorrhage.  Seizures.  *    A. fib history not on anticoagulation. A fib, RVR this admission.  Currently in NSR.  *   Markedly elevated BNP.  *    ESRD.    *   Left wrist fracture.   PLAN   *   No plans at present for endoscopy.  *    Would advance diet as tolerated to solid food.    Azucena Freed  11/10/2020, 10:38 AM Phone [phone]  I have discussed the case with the PA, and that is the plan I formulated. I personally interviewed and examined the patient. I also personally reviewed the CT images from yesterday.  Limited by lack of contrast  Patient's abdominal pain nausea and vomiting are significantly improved.  Perhaps related to constipation, acute infectious illness, possibly GI symptoms of COVID.  It would seem at this point that perhaps the initial hemoglobin of 11 was spurious and the true baseline value is about 6.5, likely due to chronic kidney disease.  She has improved with transfusion, still has markedly elevated BNP and symptomatic COVID.  No current plans for endoscopic evaluation.  GI signing off,  please call as needed.  Wilfrid Lund, [place] III Office: [phone]

## 2020-11-10 NOTE — Progress Notes (Signed)
   Ortho Hand Progress Note  Subjective: Remains in the ICU.  Continues to complain of left wrist pain.  Denies numbness or tingling in the digits.   Objective: Vital signs in last 24 hours: Temp:  [97.5 F (36.4 C)-98.2 F (36.8 C)] 97.7 F (36.5 C) (01/22 0400) Pulse Rate:  [57-79] 69 (01/22 1000) Resp:  [11-30] 16 (01/22 1000) BP: (80-128)/(48-78) 115/65 (01/22 1000) SpO2:  [94 %-100 %] 97 % (01/22 1000) Weight:  [44.8 kg] 44.8 kg (01/22 0500)  Intake/Output from previous day: 01/21 0701 - 01/22 0700 In: 1706.5 [I.V.:110; Blood:1246.5; IV Piggyback:350] Out: -  Intake/Output this shift: No intake/output data recorded.  Recent Labs    11/08/20 1404 11/09/20 0806 11/09/20 1008 11/09/20 2241 11/10/20 0431  HGB 11.0* 6.8* 6.5* 10.7* 9.7*   Recent Labs    11/09/20 0806 11/09/20 1008 11/09/20 1340 11/09/20 2241 11/10/20 0431  WBC 5.9  --   --   --  6.1  RBC 2.34*  --   --   --  3.46*  HCT 20.6*   < >  --  31.8* 29.1*  PLT 255  --  249  --  239   < > = values in this interval not displayed.   Recent Labs    11/09/20 0806 11/10/20 0431  NA 134* 133*  K 4.1 4.3  CL 88* 91*  CO2 24 21*  BUN 71* 80*  CREATININE 10.36* 10.53*  GLUCOSE 98 101*  CALCIUM 8.8* 8.8*   Recent Labs    11/09/20 1340  INR 1.3*    Aaox3 nad Resp nonlabored RRR Left upper extremity: Left hand and wrist are immobilized in a volar splint.  Exposed digits with no significant swelling.  She has tenderness palpation through her splint around the distal radius and ulna.  Compartments of the forearm are soft.  She has intact gentle active flexion extension of the fingers.  Intact sensation throughout her fingertips.  Fingertips are warm well perfused with brisk capillary refill.   Assessment/Plan: - Displaced left distal radius and ulna fracture -Acute COVID-19 infection -Abdominal pain -End-stage renal disease -Acute on chronic anemia -A. fib with RVR  - Continue care under  hospitalist/critical care services. - Patient continues to be medically unstable - Once improved, surgical fixation of the left distal radius could be warranted - Will have ortho tech place new splint to free up her MP joints and digits - LUE elevation and ice as needed - No concern for compartment syndrome  Remainder per primary    Avanell Shackleton III 11/10/2020, 10:47 AM  [phone]

## 2020-11-10 NOTE — Progress Notes (Signed)
Orthopedic Tech Progress Note Patient Details:  [name] Oct 06, 1961 [id number] MD removed ring on patient middle finger and I applied VOLAR SPLINT with padding.  Ortho Devices Type of Ortho Device: Volar splint Ortho Device/Splint Location: LUE Ortho Device/Splint Interventions: Ordered,Application,Removal,Adjustment   Post Interventions Patient Tolerated: Poor,Fair Instructions Provided: Care of device   Janit Pagan 11/10/2020, 1:23 PM

## 2020-11-10 NOTE — Progress Notes (Addendum)
[facility] Progress Note  Name: [name] MRN: [id number] DOB: August 09, 1961   Subjective:  She was moved to the ICU in the interim.  Hemoglobin up after PRBC's.    Review of systems:  Still altered but better - denies n/v  Denies shortness of breath or chest pain -------   Intake/Output Summary (Last 24 hours) at 11/10/2020 0624 Last data filed at 11/10/2020 0500 Gross per 24 hour  Intake 1706.47 ml  Output --  Net 1706.47 ml    Vitals:  Vitals:   11/10/20 0200 11/10/20 0315 11/10/20 0400 11/10/20 0500  BP: (!) 102/58 102/66 108/66 (!) 114/50  Pulse: 67 70 67 68  Resp: '18 18 17 '$ (!) 21  Temp:   97.7 F (36.5 C)   TempSrc:   Axillary   SpO2: 97% 96% 96% 98%  Weight:    44.8 kg     Physical Exam:  General adult female in bed in no acute distress HEENT normocephalic atraumatic extraocular movements intact sclera anicteric Neck supple trachea midline Lungs clear to auscultation bilaterally normal work of breathing at rest; room air Heart S1S2 no rub Abdomen soft nontender nondistended Extremities no edema  Psych calm mood and affect Neuro - oriented to person not location or year Access: right AVF bruit and thrill   Medications reviewed   Labs:  BMP Latest Ref Rng & Units 11/10/2020 11/09/2020 11/08/2020  Glucose 70 - 99 mg/dL 101(H) 98 136(H)  BUN 6 - 20 mg/dL 80(H) 71(H) 53(H)  Creatinine 0.44 - 1.00 mg/dL 10.53(H) 10.36(H) 9.53(H)  Sodium 135 - 145 mmol/L 133(L) 134(L) 140  Potassium 3.5 - 5.1 mmol/L 4.3 4.1 4.1  Chloride 98 - 111 mmol/L 91(L) 88(L) 91(L)  CO2 22 - 32 mmol/L 21(L) 24 21(L)  Calcium 8.9 - 10.3 mg/dL 8.8(L) 8.8(L) 9.5   Outpatient:  [facility] - TTS 3.75hrs 400/800 38kg 2K 2CaTDC Calcitriol '120mg'$  qHD No Heparin mircera 72mg q2wks - last 1060m on 08/23/20 Calcitriol 22m16mqHD  Velphoro 2 AC TID, 1 with snacks  Last HD 3hrs on 1/18. Mostly compliant. Missed VVS post OP f/u - plication on [id number]/Plan:   Acute  anemia -  PRBC's per primary. Improved with PRBC's. Exacerbating the anemia of CKD.  aranesp 100 mcg on 1/22  ESRD - HD per TTS schedule normally; try for HD on 1/22 as staffing permits.  She is stable for HD in the unit upstairs and appears stable for transfer to floor from my standpoint   AMS - appreciate efforts of primary team.  Caution with sedating meds.  Would avoid morphine in ESRD patients  Hypotension - improved  A fib - per primary team   Covid 19 infection - per primary team ;on remdesivir   Metabolic bone disease - binders when taking PO. Continue calcitriol    LorClaudia DesanctisD 11/10/2020 6:41 AM

## 2020-11-11 DIAGNOSIS — I4891 Unspecified atrial fibrillation: Secondary | ICD-10-CM | POA: Diagnosis not present

## 2020-11-11 LAB — BASIC METABOLIC PANEL
Anion gap: 17 — ABNORMAL HIGH (ref 5–15)
BUN: 41 mg/dL — ABNORMAL HIGH (ref 6–20)
CO2: 21 mmol/L — ABNORMAL LOW (ref 22–32)
Calcium: 8.6 mg/dL — ABNORMAL LOW (ref 8.9–10.3)
Chloride: 94 mmol/L — ABNORMAL LOW (ref 98–111)
Creatinine, Ser: 6.48 mg/dL — ABNORMAL HIGH (ref 0.44–1.00)
GFR, Estimated: 7 mL/min — ABNORMAL LOW (ref >60)
Glucose, Bld: 129 mg/dL — ABNORMAL HIGH (ref 70–99)
Potassium: 3.3 mmol/L — ABNORMAL LOW (ref 3.5–5.1)
Sodium: 132 mmol/L — ABNORMAL LOW (ref 135–145)

## 2020-11-11 LAB — CBC WITH DIFFERENTIAL/PLATELET
Abs Immature Granulocytes: 0.04 10*3/uL (ref 0.00–0.07)
Basophils Absolute: 0 10*3/uL (ref 0.0–0.1)
Basophils Relative: 0 %
Eosinophils Absolute: 0 10*3/uL (ref 0.0–0.5)
Eosinophils Relative: 0 %
HCT: 32.8 % — ABNORMAL LOW (ref 36.0–46.0)
Hemoglobin: 11.5 g/dL — ABNORMAL LOW (ref 12.0–15.0)
Immature Granulocytes: 1 %
Lymphocytes Relative: 19 %
Lymphs Abs: 1.1 10*3/uL (ref 0.7–4.0)
MCH: 29.1 pg (ref 26.0–34.0)
MCHC: 35.1 g/dL (ref 30.0–36.0)
MCV: 83 fL (ref 80.0–100.0)
Monocytes Absolute: 0.4 10*3/uL (ref 0.1–1.0)
Monocytes Relative: 7 %
Neutro Abs: 4.3 10*3/uL (ref 1.7–7.7)
Neutrophils Relative %: 73 %
Platelets: 328 10*3/uL (ref 150–400)
RBC: 3.95 MIL/uL (ref 3.87–5.11)
RDW: 18.2 % — ABNORMAL HIGH (ref 11.5–15.5)
WBC: 6 10*3/uL (ref 4.0–10.5)
nRBC: 0 % (ref 0.0–0.2)

## 2020-11-11 LAB — COMPREHENSIVE METABOLIC PANEL
ALT: 21 U/L (ref 0–44)
AST: 19 U/L (ref 15–41)
Albumin: 2.2 g/dL — ABNORMAL LOW (ref 3.5–5.0)
Alkaline Phosphatase: 77 U/L (ref 38–126)
Anion gap: 22 — ABNORMAL HIGH (ref 5–15)
BUN: 98 mg/dL — ABNORMAL HIGH (ref 6–20)
CO2: 20 mmol/L — ABNORMAL LOW (ref 22–32)
Calcium: 9.2 mg/dL (ref 8.9–10.3)
Chloride: 91 mmol/L — ABNORMAL LOW (ref 98–111)
Creatinine, Ser: 11.75 mg/dL — ABNORMAL HIGH (ref 0.44–1.00)
GFR, Estimated: 3 mL/min — ABNORMAL LOW (ref >60)
Glucose, Bld: 86 mg/dL (ref 70–99)
Potassium: 4.2 mmol/L (ref 3.5–5.1)
Sodium: 133 mmol/L — ABNORMAL LOW (ref 135–145)
Total Bilirubin: 0.6 mg/dL (ref 0.3–1.2)
Total Protein: 5.6 g/dL — ABNORMAL LOW (ref 6.5–8.1)

## 2020-11-11 LAB — C-REACTIVE PROTEIN: CRP: 16.5 mg/dL — ABNORMAL HIGH (ref <1.0)

## 2020-11-11 LAB — BRAIN NATRIURETIC PEPTIDE: B Natriuretic Peptide: 3906.9 pg/mL — ABNORMAL HIGH (ref 0.0–100.0)

## 2020-11-11 LAB — TSH: TSH: 0.01 u[IU]/mL — ABNORMAL LOW (ref 0.350–4.500)

## 2020-11-11 LAB — D-DIMER, QUANTITATIVE: D-Dimer, Quant: 3.99 ug/mL-FEU — ABNORMAL HIGH (ref 0.00–0.50)

## 2020-11-11 LAB — MAGNESIUM
Magnesium: 2.3 mg/dL (ref 1.7–2.4)
Magnesium: 2 mg/dL (ref 1.7–2.4)

## 2020-11-11 LAB — LIPASE, BLOOD: Lipase: 51 U/L (ref 11–51)

## 2020-11-11 LAB — PROCALCITONIN: Procalcitonin: 27.21 ng/mL

## 2020-11-11 MED ORDER — AMIODARONE HCL IN DEXTROSE 360-4.14 MG/200ML-% IV SOLN
30.0000 mg/h | INTRAVENOUS | Status: DC
  Administered 2020-11-12 – 2020-11-15 (×8): 30 mg/h via INTRAVENOUS
  Filled 2020-11-11 (×9): qty 200

## 2020-11-11 MED ORDER — ALBUMIN HUMAN 25 % IV SOLN
12.5000 g | Freq: Once | INTRAVENOUS | Status: AC
  Administered 2020-11-11: 12.5 g via INTRAVENOUS
  Filled 2020-11-11: qty 50

## 2020-11-11 MED ORDER — ACETAMINOPHEN 325 MG PO TABS
650.0000 mg | ORAL_TABLET | Freq: Four times a day (QID) | ORAL | Status: DC | PRN
  Administered 2020-11-11 – 2020-11-19 (×4): 650 mg via ORAL
  Filled 2020-11-11 (×5): qty 2

## 2020-11-11 MED ORDER — DILTIAZEM HCL-DEXTROSE 125-5 MG/125ML-% IV SOLN (PREMIX)
5.0000 mg/h | INTRAVENOUS | Status: DC
  Administered 2020-11-11: 5 mg/h via INTRAVENOUS
  Filled 2020-11-11: qty 125

## 2020-11-11 MED ORDER — AMIODARONE LOAD VIA INFUSION
150.0000 mg | Freq: Once | INTRAVENOUS | Status: AC
  Administered 2020-11-11: 150 mg via INTRAVENOUS
  Filled 2020-11-11: qty 83.34

## 2020-11-11 MED ORDER — DILTIAZEM LOAD VIA INFUSION
10.0000 mg | Freq: Once | INTRAVENOUS | Status: AC
  Administered 2020-11-11: 10 mg via INTRAVENOUS
  Filled 2020-11-11: qty 10

## 2020-11-11 MED ORDER — AMIODARONE HCL IN DEXTROSE 360-4.14 MG/200ML-% IV SOLN
60.0000 mg/h | INTRAVENOUS | Status: AC
  Administered 2020-11-11: 60 mg/h via INTRAVENOUS
  Filled 2020-11-11: qty 200

## 2020-11-11 MED ORDER — MIDODRINE HCL 5 MG PO TABS
5.0000 mg | ORAL_TABLET | Freq: Two times a day (BID) | ORAL | Status: DC
  Administered 2020-11-11 – 2020-11-18 (×15): 5 mg via ORAL
  Filled 2020-11-11 (×15): qty 1

## 2020-11-11 NOTE — Progress Notes (Addendum)
[facility] MD Dr. Hal Hope notified of patient hypotension on cardizem drip, continued Afib uncontrolled rate, orders received from Dr. Oletta Darter.  No orders given at this time.

## 2020-11-11 NOTE — Evaluation (Signed)
Clinical/Bedside Swallow Evaluation Patient Details  Name: [name] MRN: [id number] Date of Birth: [birth year]  Today's Date: 11/11/2020 Time: SLP Start Time (ACUTE ONLY): [id number] SLP Stop Time (ACUTE ONLY): 1009 SLP Time Calculation (min) (ACUTE ONLY): 11 min  Past Medical History:  Past Medical History:  Diagnosis Date  . Anterior cerebral artery aneurysm    1996 AT [facility]  . Coronary atherosclerosis of native coronary artery    STEMI Feb 2009 - 70% distal LAD lesion c/w plaque rupture, DES distal RCA  . Diastolic heart failure    LVEF 60-65% with grade 2 diastolic dysfunction - July 2014  . End-stage renal disease on hemodialysis ([place]) 02/03/2019  . ESRD (end stage renal disease) on dialysis ([place])   . HTN (hypertension)    Resistant  . Hypercholesterolemia   . Subarachnoid hemorrhage ([place])    03/2012  . Tobacco abuse    Resumed smoking half a pack a day. She was a smoker in the past and states she was abl eto stop in the past using nicotine patches  . Unspecified atrial fibrillation ([place]) 02/03/2019   Past Surgical History:  Past Surgical History:  Procedure Laterality Date  . ABDOMINAL HYSTERECTOMY    . BASCILIC VEIN TRANSPOSITION Right 05/13/2013   Procedure: [place];  Surgeon: Rosetta Posner, MD;  Location: [place];  Service: Vascular;  Laterality: Right;  . BRAIN SURGERY     2   . INSERTION OF DIALYSIS CATHETER Right 10/15/2020   Procedure: INSERTION OF TUNNELED DIALYSIS CATHETER;  Surgeon: Cherre Robins, MD;  Location: [place];  Service: Vascular;  Laterality: Right;  . LOOP RECORDER IMPLANT N/A 11/14/2013   Procedure: LOOP RECORDER IMPLANT;  Surgeon: Evans Lance, MD;  Location[facility] CATH LAB;  Service: Cardiovascular;  Laterality: N/A;  . REVISON OF ARTERIOVENOUS FISTULA Right 10/15/2020   Procedure: REVISON OF ARTERIOVENOUS FISTULA ARM;  Surgeon: Cherre Robins, MD;  Location: [facility] OR;  Service: Vascular;  Laterality: Right;  . TEE WITHOUT CARDIOVERSION  N/A 11/14/2013   Procedure: TRANSESOPHAGEAL ECHOCARDIOGRAM (TEE);  Surgeon: Candee Furbish, MD;  Location: [facility] ENDOSCOPY;  Service: Cardiovascular;  Laterality: N/A;  . VENTRICULOPERITONEAL SHUNT  03/27/2012   Procedure: SHUNT INSERTION VENTRICULAR-PERITONEAL;  Surgeon: Winfield Cunas, MD;  Location: [place] NEURO ORS;  Service: Neurosurgery;  Laterality: N/A;  Ventricular-Peritoneal Shunt Insertion   HPI:  Pt is a 60 y.o. female with medical history significant of ESRD on HD TTS, CAD, hypertension, hyperlipidemia, paroxysmal A. fib not on chronic anticoagulation due to history of cerebral aneurysm with subarachnoid hemorrhage in 2013, seizure disorder, embolic CVA in [id number] who presented to the ED with complaints of nausea, vomiting, and epigastric abdominal pain and found to have COVID-19. CT abdomen: Increased left basilar opacity is noted concerning for worsening pneumonia or atelectasis. GI was consulted and is currently deferring endoscopy and recommended advancing diet to solid as tolerated. Pt was last seen for swallowing at this facility on 11/15/13 and a puree diet with thin liquids was recommended at that time. Seth Bake, RN reported on the date of evaluation that the pt has been tolerating clear liquids without overt s/sx of aspiration, but questions which solid consistency is most appropriate due pt's hx of CVA.   Assessment / Plan / Recommendation Clinical Impression  Pt was seen for bedside swallow evaluation and she denied a history of dysphagia. Oral mechanism exam was limited due to pt's difficulty following commands; however, oral motor strength and ROM appeared grossly WFL. Dentition was  reduced, but adequate for mastication. She tolerated all solids and liquids without signs or symptoms of oropharyngeal dysphagia. A regular texture diet with thin liquids is recommended at this time and further skilled SLP services are not clinically indicated for swallowing. SLP Visit Diagnosis: Dysphagia, unspecified  (R13.10)    Aspiration Risk  No limitations    Diet Recommendation Regular;Thin liquid   Liquid Administration via: Cup;Straw Medication Administration: Whole meds with liquid Supervision: Staff to assist with self feeding Compensations: Slow rate Postural Changes: Seated upright at 90 degrees    Other  Recommendations Oral Care Recommendations: Oral care BID   Follow up Recommendations None      Frequency and Duration            Prognosis        Swallow Study   General Date of Onset: 11/10/20 HPI: Pt is a 60 y.o. female with medical history significant of ESRD on HD TTS, CAD, hypertension, hyperlipidemia, paroxysmal A. fib not on chronic anticoagulation due to history of cerebral aneurysm with subarachnoid hemorrhage in 2013, seizure disorder, embolic CVA in [id number] who presented to the ED with complaints of nausea, vomiting, and epigastric abdominal pain.  CT abdomen: Increased left basilar opacity is noted concerning for worsening pneumonia or atelectasis. GI was consulted and is currently deferring endoscopy and recommended advancing diet to solid as tolerated. Pt was last seen for swallowing at this facility on 11/15/13 and a puree diet with thin liquids was recommended at that time. Seth Bake, RN reported that date of evaluation that the pt has been tolerating clear liquids without overt s/sx of aspiration, but questions which solid consistency is most appropriate due pt's hx of CVA. Type of Study: Bedside Swallow Evaluation Previous Swallow Assessment: See HPI Diet Prior to this Study: Thin liquids (clear liquids) Temperature Spikes Noted: No Respiratory Status: Room air History of Recent Intubation: No Behavior/Cognition: Alert;Cooperative;Pleasant mood Oral Cavity Assessment: Within Functional Limits Oral Care Completed by SLP: No Oral Cavity - Dentition: Adequate natural dentition Vision: Functional for self-feeding Self-Feeding Abilities: Needs assist Patient  Positioning: Upright in bed;Postural control adequate for testing Baseline Vocal Quality: Normal Volitional Cough: Strong Volitional Swallow: Able to elicit    Oral/Motor/Sensory Function Overall Oral Motor/Sensory Function: Within functional limits   Ice Chips Ice chips: Within functional limits Presentation: Spoon   Thin Liquid Thin Liquid: Within functional limits Presentation: Straw    Nectar Thick Nectar Thick Liquid: Not tested   Honey Thick Honey Thick Liquid: Not tested   Puree Puree: Within functional limits Presentation: Spoon   Solid     Solid: Within functional limits Presentation: [place] I. Hardin Negus, [place], [place] Office number [phone] Pager [phone]   Horton Marshall 11/11/2020,10:09 AM

## 2020-11-11 NOTE — Progress Notes (Addendum)
PROGRESS NOTE                                                                                                                                                                                                             Patient Demographics:    [name], is a 60 y.o. female, DOB - May 19, 1961, [id number]  Outpatient Primary MD for the patient is Clovia Cuff, MD    LOS - 2  Admit date - 11/08/2020    Chief Complaint  Patient presents with  . Nausea       Brief Narrative (HPI from H&P)  Anevaeh [name] is a 60 y.o. female with medical history significant of ESRD on HD TTS, CAD, hypertension, hyperlipidemia, paroxysmal A. 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  She has not been vaccinated for COVID-19, in the ER she was found to be in A. fib with RVR, skipped dialysis treatment, questionable COVID-pneumonia, soon she developed worsening abdominal pain and a rapid drop in her H&H.  Currently nephrology, GI, critical care have been consulted.  Patient is extremely tenuous.   Subjective:   Patient in bed, appears comfortable, denies any headache, no fever, no chest pain or pressure, no shortness of breath, no abdominal pain. No focal weakness.   Assessment  & Plan :     1. Acute COVID-19 infection with possible early pneumonia in a patient who is not vaccinated - she is currently not hypoxic and this seems to be a secondary issue, will continue to monitor.  Total 10-day quarantine due to lack of symptoms, she will be off of quarantine on 11/18/2020.  Encouraged the patient to sit up in chair in the daytime use I-S and flutter valve for pulmonary toiletry and then prone in bed when at night.  Will advance activity and titrate down  oxygen as possible.  SpO2: 99 %  Recent Labs  Lab 11/08/20 1404 11/08/20 1814 11/09/20 0007 11/09/20 0806 11/09/20 1008 11/09/20 1340 11/09/20 2241 11/10/20 0431 11/11/20 0530  WBC 6.8  --   --  5.9  --   --   --  6.1 6.0  HGB 11.0*  --   --  6.8* 6.5*  --  10.7* 9.7* 11.5*  HCT 33.5*  --   --  20.6* 20.0*  --  31.8* 29.1* 32.8*  PLT 287  --   --  255  --  249  --  239 328  CRP  --   --  23.2* 23.2*  --   --   --  17.7* 16.5*  BNP  --   --   --  3,445.5*  --   --   --  2,805.6* 3,906.9*  DDIMER  --   --  1.91* 2.21*  --  1.92*  --  1.45* 3.99*  PROCALCITON 14.27  --   --  29.06  --   --   --  31.19  --   AST 38  --   --  30  --   --   --  20 19  ALT 18  --   --  18  --   --   --  16 21  ALKPHOS 61  --   --  55  --   --   --  83 77  BILITOT 0.9  --   --  0.7  --   --   --  0.8 0.6  ALBUMIN 3.3*  --   --  2.6*  --   --   --  2.3* 2.2*  INR  --   --   --   --   --  1.3*  --   --   --   SARSCOV2NAA  --  POSITIVE*  --   --   --   --   --   --   --     2. Epigastric abdominal pain ongoing for 12 hours with a drop in H&H.  On IV PPI, CT scan done few hours ago was nonacute, KUB this morning does not show any free air under the diaphragm.  Her abdomen is tender on exam, GI is following, will repeat CT scan to rule out retroperitoneal bleed.  Lipase has bumped on 11/10/2020 but less than 3X from top normal, improved on 11/11/20, will defer this to GI.  LFTs are stable, CT did not show any pancreatic enhancement.  3. acute on chronic anemia.  Baseline anemia of chronic disease due to ESRD, acute drop unexplained, did have significant abdominal discomfort, CT scan abdomen pelvis negative x2 this admission for retroperitoneal bleed or acute changes, GI on board, status post 2 units of packed RBC transfusion on 11/09/2020 with stable posttransfusion H&H, on PPI IV which will be continued, no signs of ongoing bleed. Stable  DIC panel.  4. ESRD.  On TTS schedule, missed dialysis on 11/08/2020.   Renal consulted.  5. left wrist pain and fracture.  Maintain splint, Ortho consulted on 11/09/2020, still has some pain and reduced range of motion in her left fingers, Ortho following and informed.  Patient tells me on 11/10/2020 that she fell at home 3 to 4 weeks ago and that is when she hurt her wrist.  6. lack of IV access.  PCCM consulted for line placement.  Right femoral line placed on 11/09/2020 - DC'd 11/11/20. Has a PIV.  7. history of seizures.  On Keppra.  8. A. fib RVR.  Has underlying paroxysmal A. fib, not on anticoagulation due to previous history of subarachnoid hemorrhage, [place] vas 2 score of greater than 3.  Currently on no medications if needed may require amiodarone due to hypotension.  9. Generalized weakness.  Supportive care.  PT OT.  10.  Large stool burden, disimpaction done, now on bowel regimen.  11. Chronic hypotension.  Low-dose midodrine.       Condition - Extremely Guarded  Family Communication  : daughter [name]  [phone] - 11/09/20 , 11/10/20 message left at 9:55 AM, 11/11/20 message left 10:09 AM  Code Status :  Full  Consults  :  Renal, PCCM, GI, Ortho  Procedures  :    R. Femoral line placement by PCCM on 11/09/2020, [place] 11/11/20  CT - Small left pleural effusion with patchy airspace opacity at the left lung base which may be due to atelectasis and/or infectious etiology Nonspecific mild pancreatic ductal dilatation Diverticulosis without diverticulitis Moderately dilated rectum with a fecal ball Aortic Atherosclerosis   PUD Prophylaxis : IV PPI  Disposition Plan  :    Status is: Inpt  Dispo: The patient is from: Home              Anticipated d/c is to: Home              Anticipated d/c date is: > 3 days              Patient currently is not medically stable to d/c.  DVT Prophylaxis  : SCDs   Lab Results  Component Value Date   PLT 328 11/11/2020    Diet :  Diet Order            Diet clear liquid Room service appropriate? Yes;  Fluid consistency: Thin  Diet effective now                  Inpatient Medications  Scheduled Meds: . bisacodyl  10 mg Oral Daily  . calcitRIOL  1 mcg Oral Q T,Th,Sa-HD  . Chlorhexidine Gluconate Cloth  6 each Topical Q0600  . darbepoetin (ARANESP) injection - NON-DIALYSIS  100 mcg Subcutaneous Q Sat-1800  . pantoprazole (PROTONIX) IV  40 mg Intravenous Q12H  . polyethylene glycol  17 g Oral BID   Continuous Infusions: . [START ON 11/12/2020] ceFEPime (MAXIPIME) IV    . ceFEPime (MAXIPIME) IV    . levETIRAcetam 500 mg (11/11/20 0035)  . remdesivir 100 mg in NS 100 mL 100 mg (11/11/20 0900)   PRN Meds:.[DISCONTINUED] acetaminophen **OR** acetaminophen, fentaNYL (SUBLIMAZE) injection, prochlorperazine, sodium chloride flush  Antibiotics  :    Anti-infectives (From admission, onward)   Start     Dose/Rate Route Frequency Ordered Stop   11/12/20 2200  ceFEPIme (MAXIPIME) 1 g in sodium chloride 0.9 % 100 mL IVPB        1 g 200 mL/hr over 30 Minutes Intravenous Every 24 hours 11/10/20 1311     11/11/20 1300  ceFEPIme (MAXIPIME) 1 g in sodium chloride 0.9 % 100 mL IVPB        1 g 200 mL/hr over 30 Minutes Intravenous  Once 11/10/20 1311     11/11/20 0600  levofloxacin (LEVAQUIN) IVPB 500 mg  Status:  Discontinued        500 mg 100 mL/hr over 60 Minutes Intravenous Every 48 hours 11/09/20 0413 11/10/20 1312   11/10/20 1000  remdesivir 100 mg in sodium chloride 0.9 % 100 mL IVPB       "Followed by" Linked Group Details   100 mg 200 mL/hr over 30 Minutes Intravenous Daily 11/09/20 0023 11/14/20 0959   11/09/20 0500  levofloxacin (LEVAQUIN) IVPB 750 mg        750 mg 100 mL/hr over 90 Minutes Intravenous  Once 11/09/20 0413 11/09/20 1504   11/09/20 0100  remdesivir 200 mg in sodium chloride 0.9% 250 mL IVPB       "Followed by" Linked Group Details   200 mg 580 mL/hr over 30 Minutes Intravenous Once 11/09/20 0023 11/09/20 1046       Time Spent in minutes  30   Lala Lund  M.D on 11/11/2020 at 10:05 AM  To page go to [email or web address]   [facility] -  Office  [phone]  See all Orders from today for further details    Objective:   Vitals:   11/11/20 0400 11/11/20 0500 11/11/20 0700 11/11/20 0800  BP: (!) 111/48 109/84 (!) 113/55 95/73  Pulse: 73 72 74 81  Resp: 19 (!) '22 19 19  '$ Temp: 98 F (36.7 C)     TempSrc: Axillary     SpO2: 97% 98% 98% 99%  Weight:        Wt Readings from Last 3 Encounters:  11/10/20 44.8 kg  10/26/20 40.7 kg  10/17/20 42.5 kg     Intake/Output Summary (Last 24 hours) at 11/11/2020 1005 Last data filed at 11/11/2020 0839 Gross per 24 hour  Intake 220 ml  Output -  Net 220 ml     Physical Exam  Awake Alert, moving all 4 extremities but reduced range of motion noted in the left fingers, left wrist in splint, right IJ dialysis catheter. [place].AT,PERRAL Supple Neck,No JVD, No cervical lymphadenopathy appriciated.  Symmetrical Chest wall movement, Good air movement bilaterally, CTAB RRR,No Gallops, Rubs or new Murmurs, No Parasternal Heave +ve B.Sounds, Abd Soft, No tenderness, No organomegaly appriciated, No rebound - guarding or rigidity. No Cyanosis, L.arm in splint     Data Review:    CBC Recent Labs  Lab 11/08/20 1404 11/09/20 0806 11/09/20 1008 11/09/20 1340 11/09/20 2241 11/10/20 0431 11/11/20 0530  WBC 6.8 5.9  --   --   --  6.1 6.0  HGB 11.0* 6.8* 6.5*  --  10.7* 9.7* 11.5*  HCT 33.5* 20.6* 20.0*  --  31.8* 29.1* 32.8*  PLT 287 255  --  249  --  239 328  MCV 90.1 88.0  --   --   --  84.1 83.0  MCH 29.6 29.1  --   --   --  28.0 29.1  MCHC 32.8 33.0  --   --   --  33.3 35.1  RDW 15.8* 15.8*  --   --   --  17.9* 18.2*  LYMPHSABS  --   --   --   --   --  0.8 1.1  MONOABS  --   --   --   --   --  0.3 0.4  EOSABS  --   --   --   --   --  0.0 0.0  BASOSABS  --   --   --   --   --  0.0 0.0    Recent Labs  Lab 11/08/20 1404 11/09/20 0007 11/09/20 0806 11/09/20 1340 11/10/20 0431  11/11/20 0530  NA 140  --  134*  --  133* 133*  K 4.1  --  4.1  --  4.3 4.2  CL 91*  --  88*  --  91* 91*  CO2 21*  --  24  --  21* 20*  GLUCOSE 136*  --  98  --  101* 86  BUN 53*  --  71*  --  80* 98*  CREATININE 9.53*  --  10.36*  --  10.53* 11.75*  CALCIUM 9.5  --  8.8*  --  8.8* 9.2  AST 38  --  30  --  20 19  ALT 18  --  18  --  16 21  ALKPHOS 61  --  55  --  83 77  BILITOT 0.9  --  0.7  --  0.8 0.6  ALBUMIN 3.3*  --  2.6*  --  2.3* 2.2*  MG 2.6*  --  2.5*  --  2.4 2.3  CRP  --  23.2* 23.2*  --  17.7* 16.5*  DDIMER  --  1.91* 2.21* 1.92* 1.45* 3.99*  PROCALCITON 14.27  --  29.06  --  31.19  --   INR  --   --   --  1.3*  --   --   BNP  --   --  3,445.5*  --  2,805.6* 3,906.9*    ------------------------------------------------------------------------------------------------------------------ Recent Labs    11/09/20 0007  TRIG 112    Lab Results  Component Value Date   HGBA1C 5.2 10/07/2020   ------------------------------------------------------------------------------------------------------------------ No results for input(s): TSH, T4TOTAL, T3FREE, THYROIDAB in the last 72 hours.  Invalid input(s): FREET3  Cardiac Enzymes No results for input(s): CKMB, TROPONINI, MYOGLOBIN in the last 168 hours.  Invalid input(s): CK ------------------------------------------------------------------------------------------------------------------    Component Value Date/Time   BNP 3,906.9 (H) 11/11/2020 0530    Micro Results Recent Results (from the past 240 hour(s))  SARS Coronavirus 2 by RT PCR (hospital order, performed in [facility] hospital lab) Nasopharyngeal Nasopharyngeal Swab     Status: Abnormal   Collection Time: 11/08/20  6:14 PM   Specimen: Nasopharyngeal Swab  Result Value Ref Range Status   SARS Coronavirus 2 POSITIVE (A) NEGATIVE Final    Comment: RESULT CALLED TO, READ BACK BY AND VERIFIED WITH: Diannia Ruder RN 11/08/20 2359 JDW (NOTE) SARS-CoV-2 target  nucleic acids are DETECTED  SARS-CoV-2 RNA is generally detectable in upper respiratory specimens  during the acute phase of infection.  Positive results are indicative  of the presence of the identified virus, but do not rule out bacterial infection or co-infection with other pathogens not detected by the test.  Clinical correlation with patient history and  other diagnostic information is necessary to determine patient infection status.  The expected result is negative.  Fact Sheet for Patients:   StrictlyIdeas.no   Fact Sheet for Healthcare Providers:   BankingDealers.co.za    This test is not yet approved or cleared by the [place] FDA and  has been authorized for detection and/or diagnosis of SARS-CoV-2 by FDA under an Emergency Use Authorization (EUA).  This EUA will remain in effect (meaning this test can  be used) for the duration of  the COVID-19 declaration under Section 564(b)(1) of the Act, 21 U.S.C. section 360-bbb-3(b)(1), unless the authorization is terminated or revoked sooner.  Performed at [facility], [place] [address] [place], [place] [address]     Radiology Reports CT ABDOMEN PELVIS WO CONTRAST  Result Date: 11/09/2020 CLINICAL DATA:  Acute generalized abdominal pain. EXAM: CT ABDOMEN AND PELVIS WITHOUT CONTRAST TECHNIQUE: Multidetector CT imaging of the abdomen and pelvis was performed following the standard protocol without IV contrast. COMPARISON:  November 08, 2020. FINDINGS: Lower chest: Increased left basilar opacity is noted concerning for worsening pneumonia or atelectasis. Mild right posterior basilar subsegmental atelectasis is noted. Hepatobiliary: No gallstones or biliary dilatation is noted. Multiple rounded hepatic low densities are noted most consistent  with cysts. Pancreas: No definite evidence of acute inflammation is noted. Stable mild nonspecific ductal dilatation is noted. Spleen: Normal in  size without focal abnormality. Adrenals/Urinary Tract: Adrenal glands appear normal. Bilateral renal atrophy is noted consistent with end-stage renal disease. Left renal cysts are noted. No hydronephrosis or renal obstruction is noted. Urinary bladder is unremarkable. Stomach/Bowel: Stomach is within normal limits. Appendix appears normal. Large amount of stool is again noted in the rectum concerning for impaction. No abnormal bowel dilatation is otherwise noted. Vascular/Lymphatic: Aortic atherosclerosis. No enlarged abdominal or pelvic lymph nodes. Reproductive: Status post hysterectomy. No adnexal masses. Other: Ventriculoperitoneal shunt is noted with tip in left side of abdomen. No hernia or ascites is noted. Musculoskeletal: No acute or significant osseous findings. IMPRESSION: 1. Increased left basilar opacity is noted concerning for worsening pneumonia or atelectasis. 2. Bilateral renal atrophy is noted consistent with end-stage renal disease. 3. Aortic atherosclerosis. 4. Large amount of stool is again noted in the rectum concerning for impaction. Aortic Atherosclerosis (ICD10-I70.0). Electronically Signed   By: Marijo Conception M.D.   On: 11/09/2020 15:05   DG Chest 2 View  Result Date: 11/08/2020 CLINICAL DATA:  Nausea x1 day. EXAM: CHEST - 2 VIEW COMPARISON:  October 15, 2020 FINDINGS: The lateral views limited in evaluation secondary to positioning of the patient's upper extremities. There is stable right-sided venous catheter positioning. Tubing from a ventriculoperitoneal shunt is also seen. Mild atelectasis and/or infiltrate is seen within the retrocardiac region of the left lung base. There is no evidence of a pleural effusion or pneumothorax. The cardiac silhouette is mildly enlarged. Marked severity calcification of the thoracic aorta is seen. The visualized skeletal structures are unremarkable. IMPRESSION: Mild left basilar atelectasis and/or infiltrate. Electronically Signed   By: Virgina Norfolk M.D.   On: 11/08/2020 19:52   DG Wrist 2 Views Left  Result Date: 11/09/2020 CLINICAL DATA:  Fall EXAM: LEFT WRIST - 2 VIEW COMPARISON:  None. FINDINGS: There is a comminuted impacted distal radius fracture with slight posterior angulation. There is a non displaced ulnar styloid fracture. Overlying soft tissue swelling is seen. IMPRESSION: Comminuted impacted distal radius fracture. Nondisplaced ulnar styloid fracture. Electronically Signed   By: Prudencio Pair M.D.   On: 11/09/2020 02:22   CT ABDOMEN PELVIS W CONTRAST  Result Date: 11/08/2020 CLINICAL DATA:  Epigastric pain EXAM: CT ABDOMEN AND PELVIS WITH CONTRAST TECHNIQUE: Multidetector CT imaging of the abdomen and pelvis was performed using the standard protocol following bolus administration of intravenous contrast. CONTRAST:  31m OMNIPAQUE IOHEXOL 300 MG/ML  SOLN COMPARISON:  None. FINDINGS: Lower chest: The visualized heart size within normal limits. No pericardial fluid/thickening. No hiatal hernia. A small left pleural effusion is seen. Patchy airspace opacity seen at the left lung base. Hepatobiliary: Multiple low-density lesions are seen throughout the liver parenchyma the largest measuring 1.2 cm in the superior right liver lobe.The main portal vein is patent. No evidence of calcified gallstones, gallbladder wall thickening or biliary dilatation. Pancreas: There is mild pancreatic ductal dilatation measuring up to 2 mm. This is nonspecific however. Spleen: Normal in size without focal abnormality. Adrenals/Urinary Tract: Both adrenal glands appear normal. Mild bilateral renal atrophy seen. There is scattered calcification the largest measuring 3 mm in the midpole of the right kidney. No hydronephrosis. Bladder is unremarkable. Stomach/Bowel: The stomach, small bowel, are normal in appearance. There is scattered colonic diverticula. There is a moderately distended rectal fecal ball. Vascular/Lymphatic: There are no enlarged mesenteric,  retroperitoneal, or  pelvic lymph nodes. Scattered aortic atherosclerotic calcifications are seen without aneurysmal dilatation. Reproductive: The patient is status post hysterectomy. No adnexal masses or collections seen. Other: No evidence of abdominal wall mass or hernia. Musculoskeletal: No acute or significant osseous findings. IMPRESSION: Small left pleural effusion with patchy airspace opacity at the left lung base which may be due to atelectasis and/or infectious etiology Nonspecific mild pancreatic ductal dilatation Diverticulosis without diverticulitis Moderately dilated rectum with a fecal ball Aortic Atherosclerosis (ICD10-I70.0). Electronically Signed   By: Prudencio Pair M.D.   On: 11/08/2020 20:34   DG Chest Port 1 View  Result Date: 11/09/2020 CLINICAL DATA:  Shortness of breath EXAM: PORTABLE CHEST 1 VIEW COMPARISON:  November 08, 2020 FINDINGS: Central catheter tip is at the cavoatrial junction. There is a intraperitoneal shunt catheter extending along the medial right hemithorax, stable. There is ill-defined airspace opacity in the left lower lobe. Lungs elsewhere clear. Heart is mildly enlarged with pulmonary venous hypertension. There is aortic atherosclerosis. No adenopathy. There is a loop recorder on the left. No bone lesions. IMPRESSION: Central catheter position unchanged. No pneumothorax. Airspace opacity likely representing pneumonia left lower lobe. Lungs elsewhere clear. There is mild cardiomegaly with a degree of pulmonary vascular congestion. Aortic Atherosclerosis (ICD10-I70.0). Electronically Signed   By: Lowella Grip III M.D.   On: 11/09/2020 08:37   DG Chest Port 1 View  Result Date: 10/15/2020 CLINICAL DATA:  Post dialysis catheter placement EXAM: PORTABLE CHEST 1 VIEW COMPARISON:  10/06/2020 FINDINGS: New right IJ tunneled dialysis catheter tip overlies the cavoatrial junction. Partially imaged shunt catheter traversing the right chest. No new consolidation. No pleural  effusion or pneumothorax. Stable cardiomediastinal contours. IMPRESSION: New right IJ tunneled dialysis catheter tip overlies the cavoatrial junction. No pneumothorax. Electronically Signed   By: Macy Mis M.D.   On: 10/15/2020 13:22   DG Abd Portable 1V  Result Date: 11/09/2020 CLINICAL DATA:  Abdominal pain, anemia EXAM: PORTABLE ABDOMEN - 1 VIEW COMPARISON:  11/08/2020 CT abdomen/pelvis FINDINGS: No disproportionately dilated small bowel loops. Moderate rectal stool, similar. Mild to moderate colonic stool and gas. No evidence of pneumatosis or pneumoperitoneum. Right-sided VP shunt catheter terminates in the left abdomen and appears intact and without kink. No radiopaque nephrolithiasis. IMPRESSION: Nonobstructive bowel gas pattern. Moderate rectal stool, similar to recent CT. Mild to moderate colonic stool and gas. Electronically Signed   By: Ilona Sorrel M.D.   On: 11/09/2020 10:11   DG Fluoro Guide CV Line-No Report  Result Date: 10/15/2020 Fluoroscopy was utilized by the requesting physician.  No radiographic interpretation.   [place] EKG SITE RITE  Result Date: 11/09/2020 If Site Rite image not attached, placement could not be confirmed due to current cardiac rhythm.

## 2020-11-11 NOTE — Progress Notes (Signed)
CRITICAL VALUE ALERT  Critical Value: Troponin I high sensitivity 493  Date & Time Notied:  11/11/20 2243  Provider Notified: Dr. Hal Hope  Orders Received/Actions taken: 12 lead ECG completed

## 2020-11-11 NOTE — Progress Notes (Signed)
eLink Physician-Brief Progress Note Patient Name: [name] DOB: 10-20-1961 MRN: [id number]   Date of Service  11/11/2020  HPI/Events of Note  Hypotension - in setting of Cardizem IV infusion for AFIB with RVR and post HD with 1 liter fluid removal. Hgb = 11.5. BP = 74/42 with MAP = 52.   eICU Interventions  Plan: 1. D/C Cardizem IV infusion. 2. Amiodarone IV load and infusion.  3. BMP and Mg++ level STAT. 4. 25% Albumin 12.5 gm IV now.  5. Not a PCCM patient at this time. Further management per primary service [facility]).     Intervention Category Major Interventions: Hypotension - evaluation and management;Arrhythmia - evaluation and management  Miller [place] 11/11/2020, 8:02 PM

## 2020-11-11 NOTE — Progress Notes (Signed)
Results of EKG communicated to provider. MD declined further troponin draws.  MD aware of soft blood pressures with amiodarone. Nurse to continue with current management of atrial fibrillation. Ok with blood pressures.

## 2020-11-11 NOTE — Progress Notes (Addendum)
[facility] Progress Note  Name: [name] MRN: [id number] DOB: 1961/02/27   Subjective:  She has been in ICU on room air.  Feels ok this morning   Review of systems:   denies n/v  Denies shortness of breath or chest pain Still a bit confused -------   Intake/Output Summary (Last 24 hours) at 11/11/2020 0619 Last data filed at 11/10/2020 1232 Gross per 24 hour  Intake 200 ml  Output --  Net 200 ml    Vitals:  Vitals:   11/10/20 1900 11/11/20 0000 11/11/20 0400 11/11/20 0500  BP: (!) 124/93 108/81 (!) 111/48 109/84  Pulse: 72 73 73 72  Resp: (!) '24 18 19 '$ (!) 22  Temp:  98.3 F (36.8 C) 98 F (36.7 C)   TempSrc:  Axillary Axillary   SpO2: 99% 99% 97% 98%  Weight:         Physical Exam:  General adult female in bed in no acute distress HEENT normocephalic atraumatic extraocular movements intact sclera anicteric Neck supple trachea midline Lungs clear to auscultation bilaterally normal work of breathing at rest; room air Heart S1S2 no rub Abdomen soft nontender nondistended Extremities no edema  Psych calm mood and affect Neuro - oriented to person and location of hospital; not oriented to  Year; more conversant today Access: right AVF bruit and thrill   Medications reviewed   Labs:  BMP Latest Ref Rng & Units 11/10/2020 11/09/2020 11/08/2020  Glucose 70 - 99 mg/dL 101(H) 98 136(H)  BUN 6 - 20 mg/dL 80(H) 71(H) 53(H)  Creatinine 0.44 - 1.00 mg/dL 10.53(H) 10.36(H) 9.53(H)  Sodium 135 - 145 mmol/L 133(L) 134(L) 140  Potassium 3.5 - 5.1 mmol/L 4.3 4.1 4.1  Chloride 98 - 111 mmol/L 91(L) 88(L) 91(L)  CO2 22 - 32 mmol/L 21(L) 24 21(L)  Calcium 8.9 - 10.3 mg/dL 8.8(L) 8.8(L) 9.5   Outpatient:  [facility] - TTS 3.75hrs 400/800 38kg 2K 2CaTDC Calcitriol '120mg'$  qHD No Heparin mircera 59mg q2wks - last 1060m on 08/23/20 Calcitriol [id number]  Velphoro 2 AC TID, 1 with snacks  Last HD 3hrs on 1/18. Mostly compliant. Missed VVS post OP f/u -  plication on [id number]/Plan:   Acute anemia -  PRBC's per primary. Improved with PRBC's. Exacerbating the anemia of CKD.  aranesp 100 mcg on 1/22  ESRD - HD per TTS schedule normally; plan for HD on 1/24 likely unless emergent need today.  Staffing emergency.   She is stable for HD in the unit upstairs  AMS - appreciate efforts of primary team.  Caution with sedating meds.  Would avoid morphine in ESRD patients  Hypotension - improved  A fib - per primary team   Covid 19 infection - per primary team ;on remdesivir   Metabolic bone disease - binders when taking PO. Continue calcitriol    LorClaudia DesanctisD 11/11/2020 6:31 AM  HD RN has arrived who is able to take covid patients today.  HD today.  Note he is charting on paper.   LorClaudia DesanctisD 11/11/2020 6:54 AM

## 2020-11-11 NOTE — Evaluation (Signed)
Physical Therapy Evaluation Patient Details Name: [name] MRN: [id number] DOB: 04-01-1961 Today's Date: 11/11/2020   History of Present Illness  60 y.o. female with medical history significant of ESRD on HD TTS, CAD, hypertension, hyperlipidemia, paroxysmal A. 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 Found to be in A-fib with RVR, unvaccinated, COVID+, CT showing small L pleural effusion at the L lung base. Reports L wrist pain from daughter falling on her wrist prior to coming to ED found to have fx and  L UE wrist splint applied in ED. NWB will eventually require ORIF. Rapid response 11/09/20 transfer to ICU, placement of untunneled R femoral central venous catheter, D/c'd 11/11/20  Clinical Impression  Pt is poor historian and is only oriented to self, however reports she lives in level entry apartment with her daughter and is able to ambulate without assistance. Pt reports she is independent in bathing and dressing and daughter does iADLs and drives her to dialysis. Pt is limited in mobility by L wrist pain which limits mobility of entire L UE. Mobility evaluation limited due to prior presence of R femoral catheter which was noted to have been removed immediately prior to PT Evaluation, PT will follow back for full Evaluation in next session. Given level of cognition and increased pain with movement of L UE and inability to bear weight through, PT recommending SNF level rehab at discharge.     Follow Up Recommendations SNF    Equipment Recommendations  Other (comment) (TBD based on accurate accounting of what she has)    Recommendations for Other Services OT consult     Precautions / Restrictions Precautions Precautions: Other (comment) (L wrist fx, R femoral untunneled cath) Required Braces or  Orthoses: Splint/Cast Splint/Cast: L wrist volar splint Splint/Cast - Date Prophylactic Dressing Applied (if applicable): 0000000 Restrictions Weight Bearing Restrictions: Yes LUE Weight Bearing: Non weight bearing Other Position/Activity Restrictions: R untunneled femoral cath no flexion past 90 degrees      Mobility  Bed Mobility               General bed mobility comments: deferred due to femoral cath              Pertinent Vitals/Pain Pain Assessment: Faces Faces Pain Scale: Hurts whole lot Pain Location: L UE movement in shoulder and elbow, toes on bilateral feet with movement Pain Descriptors / Indicators: Grimacing;Guarding;Moaning Pain Intervention(s): Limited activity within patient's tolerance;Repositioned;Monitored during session    Home Living Family/patient expects to be discharged to:: Private residence Living Arrangements: Children Available Help at Discharge: Family;Available 24 hours/day Type of Home: Apartment Home Access: Level entry     Home Layout: One level Home Equipment: None (prior hospitalization noted ot have RW) Additional Comments: noted to live with daughter and uncertain if home setup accurate as pt is poor historian    Prior Function Level of Independence: Needs assistance   Gait / Transfers Assistance Needed: independent without AD  ADL's / Homemaking Assistance Needed: able to bathe and dress, daughter does cooking and cleaning iADLs  Comments: poor historian        Extremity/Trunk Assessment   Upper Extremity Assessment Upper Extremity Assessment: RUE deficits/detail;LUE deficits/detail RUE Deficits / Details: R elbow, wrist and grip ROM WFL, shoulder flex limited to below 90 degrees, strength grossly assessed at 3/5 LUE Deficits /  Details: L radial/ulnar fx, splinted, pain with attempt to range elbow and shoulder LUE: Unable to fully assess due to pain    Lower Extremity Assessment Lower Extremity Assessment: RLE  deficits/detail;LLE deficits/detail RLE Deficits / Details: R nontunneled femoral catheter, hip and knee ROM limited due to precautions, pain in toes with ankle ROM LLE Deficits / Details: AAROM of hip, knee and ankle WFL, strength grossly 3+/5       Communication   Communication: Expressive difficulties  Cognition Arousal/Alertness: Awake/alert Behavior During Therapy: WFL for tasks assessed/performed;Flat affect Overall Cognitive Status: Impaired/Different from baseline Area of Impairment: Orientation;Following commands;Safety/judgement;Awareness;Problem solving;Memory;Attention                 Orientation Level: Disoriented to;Place;Time;Situation Current Attention Level: Selective Memory: Decreased short-term memory Following Commands: Follows multi-step commands inconsistently;Follows one step commands inconsistently;Follows one step commands with increased time Safety/Judgement: Decreased awareness of deficits Awareness: Intellectual Problem Solving: Slow processing;Decreased initiation;Difficulty sequencing;Requires verbal cues;Requires tactile cues General Comments: pt only oriented to self, requires increase cuing for activity, no awareness of how wrist broken, forgets that it hurts to move it      General Comments General comments (skin integrity, edema, etc.): VSS on RA,        Assessment/Plan    PT Assessment Patient needs continued PT services  PT Problem List Decreased strength;Decreased range of motion;Decreased activity tolerance;Decreased mobility;Decreased safety awareness;Decreased cognition;Pain       PT Treatment Interventions DME instruction;Gait training;Functional mobility training;Therapeutic activities;Cognitive remediation;Patient/family education    PT Goals (Current goals can be found in the Care Plan section)  Acute Rehab PT Goals Patient Stated Goal: none stated PT Goal Formulation: With patient Time For Goal Achievement:  11/25/20 Potential to Achieve Goals: Fair    Frequency Min 2X/week    AM-PAC PT "6 Clicks" Mobility  Outcome Measure Help needed turning from your back to your side while in a flat bed without using bedrails?: A Little Help needed moving from lying on your back to sitting on the side of a flat bed without using bedrails?: A Little Help needed moving to and from a bed to a chair (including a wheelchair)?: A Lot Help needed standing up from a chair using your arms (e.g., wheelchair or bedside chair)?: A Lot Help needed to walk in hospital room?: A Lot Help needed climbing 3-5 steps with a railing? : A Lot 6 Click Score: 14    End of Session   Activity Tolerance: Other (comment);Patient limited by pain (limited ROM and mobility due to femoral cath) Patient left: in bed;with call bell/phone within reach;with bed alarm set Nurse Communication: Mobility status PT Visit Diagnosis: Muscle weakness (generalized) (M62.81);Other abnormalities of gait and mobility (R26.89);Pain Pain - Right/Left: Left Pain - part of body: Hand;Arm    Time: [id number] PT Time Calculation (min) (ACUTE ONLY): 15 min   Charges:   PT Evaluation $PT Eval Moderate Complexity: 1 Mod          Jaime Grizzell B. Migdalia Dk PT, DPT Acute Rehabilitation Services Pager [phone] Office [phone]   [place] 11/11/2020, 11:20 AM

## 2020-11-12 ENCOUNTER — Encounter (HOSPITAL_COMMUNITY): Payer: Self-pay | Admitting: Internal Medicine

## 2020-11-12 DIAGNOSIS — R778 Other specified abnormalities of plasma proteins: Secondary | ICD-10-CM

## 2020-11-12 DIAGNOSIS — Z8616 Personal history of COVID-19: Secondary | ICD-10-CM | POA: Diagnosis not present

## 2020-11-12 DIAGNOSIS — I4891 Unspecified atrial fibrillation: Secondary | ICD-10-CM | POA: Diagnosis not present

## 2020-11-12 LAB — C-REACTIVE PROTEIN: CRP: 16.3 mg/dL — ABNORMAL HIGH (ref <1.0)

## 2020-11-12 LAB — MAGNESIUM: Magnesium: 2.1 mg/dL (ref 1.7–2.4)

## 2020-11-12 LAB — COMPREHENSIVE METABOLIC PANEL
ALT: 24 U/L (ref 0–44)
AST: 25 U/L (ref 15–41)
Albumin: 2.2 g/dL — ABNORMAL LOW (ref 3.5–5.0)
Alkaline Phosphatase: 71 U/L (ref 38–126)
Anion gap: 17 — ABNORMAL HIGH (ref 5–15)
BUN: 53 mg/dL — ABNORMAL HIGH (ref 6–20)
CO2: 23 mmol/L (ref 22–32)
Calcium: 9.2 mg/dL (ref 8.9–10.3)
Chloride: 95 mmol/L — ABNORMAL LOW (ref 98–111)
Creatinine, Ser: 7.47 mg/dL — ABNORMAL HIGH (ref 0.44–1.00)
GFR, Estimated: 6 mL/min — ABNORMAL LOW (ref >60)
Glucose, Bld: 108 mg/dL — ABNORMAL HIGH (ref 70–99)
Potassium: 3.6 mmol/L (ref 3.5–5.1)
Sodium: 135 mmol/L (ref 135–145)
Total Bilirubin: 0.4 mg/dL (ref 0.3–1.2)
Total Protein: 5.6 g/dL — ABNORMAL LOW (ref 6.5–8.1)

## 2020-11-12 LAB — T4, FREE: Free T4: 2.87 ng/dL — ABNORMAL HIGH (ref 0.61–1.12)

## 2020-11-12 LAB — TROPONIN I (HIGH SENSITIVITY)
Troponin I (High Sensitivity): 493 ng/L (ref <18)
Troponin I (High Sensitivity): 399 ng/L (ref <18)

## 2020-11-12 LAB — LIPASE, BLOOD: Lipase: 25 U/L (ref 11–51)

## 2020-11-12 LAB — CBC WITH DIFFERENTIAL/PLATELET
Abs Immature Granulocytes: 0.05 10*3/uL (ref 0.00–0.07)
Basophils Absolute: 0 10*3/uL (ref 0.0–0.1)
Basophils Relative: 0 %
Eosinophils Absolute: 0 10*3/uL (ref 0.0–0.5)
Eosinophils Relative: 0 %
HCT: 38.5 % (ref 36.0–46.0)
Hemoglobin: 13 g/dL (ref 12.0–15.0)
Immature Granulocytes: 1 %
Lymphocytes Relative: 9 %
Lymphs Abs: 0.6 10*3/uL — ABNORMAL LOW (ref 0.7–4.0)
MCH: 27.8 pg (ref 26.0–34.0)
MCHC: 33.8 g/dL (ref 30.0–36.0)
MCV: 82.3 fL (ref 80.0–100.0)
Monocytes Absolute: 0.4 10*3/uL (ref 0.1–1.0)
Monocytes Relative: 6 %
Neutro Abs: 5.4 10*3/uL (ref 1.7–7.7)
Neutrophils Relative %: 84 %
Platelets: 387 10*3/uL (ref 150–400)
RBC: 4.68 MIL/uL (ref 3.87–5.11)
RDW: 18 % — ABNORMAL HIGH (ref 11.5–15.5)
WBC: 6.5 10*3/uL (ref 4.0–10.5)
nRBC: 0.5 % — ABNORMAL HIGH (ref 0.0–0.2)

## 2020-11-12 LAB — BRAIN NATRIURETIC PEPTIDE: B Natriuretic Peptide: >4500 pg/mL — ABNORMAL HIGH (ref 0.0–100.0)

## 2020-11-12 LAB — D-DIMER, QUANTITATIVE: D-Dimer, Quant: 3.11 ug/mL-FEU — ABNORMAL HIGH (ref 0.00–0.50)

## 2020-11-12 MED ORDER — ENSURE PRE-SURGERY PO LIQD
296.0000 mL | Freq: Once | ORAL | Status: DC
  Filled 2020-11-12: qty 296

## 2020-11-12 MED ORDER — HEPARIN SODIUM (PORCINE) 5000 UNIT/ML IJ SOLN
5000.0000 [IU] | Freq: Three times a day (TID) | INTRAMUSCULAR | Status: DC
  Administered 2020-11-12 – 2020-11-15 (×9): 5000 [IU] via SUBCUTANEOUS
  Filled 2020-11-12 (×14): qty 1

## 2020-11-12 MED ORDER — CHLORHEXIDINE GLUCONATE 4 % EX LIQD
60.0000 mL | Freq: Once | CUTANEOUS | Status: DC
  Filled 2020-11-12: qty 60

## 2020-11-12 MED ORDER — VANCOMYCIN HCL IN DEXTROSE 1-5 GM/200ML-% IV SOLN
1000.0000 mg | INTRAVENOUS | Status: AC
  Administered 2020-11-13: 1000 mg via INTRAVENOUS
  Filled 2020-11-12: qty 200

## 2020-11-12 MED ORDER — POVIDONE-IODINE 10 % EX SWAB: 2.0000 "application " | Freq: Once | CUTANEOUS | Status: DC

## 2020-11-12 NOTE — Consult Note (Addendum)
Cardiology Consultation:   Patient ID: [name] MRN: [id number]; DOB: 03/13/1961  Admit date: 11/08/2020 Date of Consult: 11/12/2020  Primary Care Provider: Clovia Cuff, MD [facility] Cardiologist: Buford Dresser, MD  [place] Electrophysiologist:  None    Patient Profile:   [name] is a 60 y.o. 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 Candiss Norse.  History of Present Illness:   To preface, it appears historically this patient is generally poor historian. She had remote cath in 2009 for NSTEMI with 70% dLAD, 30% mCx, 50% dCx, 95% dRCA, 40% PLA -> RCA was treated with DES. In 2015 she had a stroke with ILR placed but with poor follow-up. In 2019 she was admitted with rapid atrial fib. At that time cardiology team recommended neurology weigh in on anticoagulation. Neurology recommended CTA head/neck to evaluate the aneurysm but since patient was new to HD at that time, nephrology felt risk out weighed benefit and recommended palliative care. She was started on amiodarone. She had elevated troponin at that time, managed conservatively. She was recently readmitted in 09/2020 with confusion. Cardiology team followed her for NSTEMI (hsTroponin 1480) and recurrent atrial fibrillation, managed conservatively, not felt to be a candidate for anticoagulation due to her history of ICH (also compliance issues). EF was normal. Dr. Judeth Cornfield follow-up note 10/26/20 reviewed - looks to be a challenging visit as patient had arrived alone and had no recollection of being at dialysis recently. They had to call daughter to discuss social work referral for home resources.  [name]  presented to the ED 11/08/20 with n/v, abdominal pain and weakness, but once again a very poor historian. She had been to HD on 1/18 but missed 1/20 due to feeling poorly. She tested positive for Covid (unvaccinated). CT abdomen showed patchy airspace opacity at the left base with small left pleural effusion, nonspecific mild pancreatic ductal dilatation, diverticulosis without diverticulitis, moderately dilated rectum with a fecal ball, + aortic atherosclerosis. She was also found to be back in atrial fib RVR without any obvious cardiac symptoms otherwise of CP, SOB, palpitations, dizziness. She was started on IV cardizem. She also complained of left wrist pain and x-rays confirmed fracture in the distal radius and ulnar styloid (unclear mechanism of injury, possible fall several weeks ago although patient did not recall) for which ortho plans ORIF on Thursday. On 11/10/19 she developed worsening anemia with Hgb 11 -> 6.8 (repeated to verify at 6.5) and hypovolemic shock. Lipase was elevated. Repeat abd CT 11/09/20 showed increased left basilar opacity c/f worsening pneumonia vs atelectasis and large stool volume but no acute explanation for drop in Hgb. Her diltiazem was stopped and she received 2 U PRBCs with stabilization in Hgb. On 11/12/19 it appears she dropped her pressure again in the context of post-HD fluid removal and resumption of diltiazem. She was subsequently started on IV amiodarone load and infusion along 12.5g of albumin. Troponins were ordered which are 493-399, but patient subsequently became combative and refused further draws. Labs otherwise notable for BNP >4500, last Hgb 13.0, d-dimer 3.11, TSH 0.010, albumin 2.2, improved lipase to 25. Of note, she is being treated with remdesivir, midodrine, and antibiotics. Has seen PT and  felt to require SNF.  Per telemetry review, appears she converted to NSR around 2:20am.   Past Medical History:  Diagnosis Date  . Anterior cerebral artery aneurysm     1996 AT [facility]  . CAD (coronary artery disease)    a. 2009 for NSTEMI with 70% dLAD, 30% mCx, 50% dCx, 95% dRCA, 40% PLA -> RCA was treated with DES.  . Diastolic heart failure    LVEF 60-65% with grade 2 diastolic dysfunction - July 2014  . Encephalopathy   . ESRD (end stage renal disease) on dialysis ([place])   . HTN (hypertension)    Resistant  . Hypercholesterolemia   . PAF (paroxysmal atrial fibrillation) ([place]) 02/03/2019  . Poor historian   . Seizures ([place])   . Stroke (cerebrum) ([place]) 2015  . Subarachnoid hemorrhage ([place])    03/2012  . Tobacco abuse    Resumed smoking half a pack a day. She was a smoker in the past and states she was abl eto stop in the past using nicotine patches    Past Surgical History:  Procedure Laterality Date  . ABDOMINAL HYSTERECTOMY    . BASCILIC VEIN TRANSPOSITION Right 05/13/2013   Procedure: [place];  Surgeon: Rosetta Posner, MD;  Location: [place];  Service: Vascular;  Laterality: Right;  . BRAIN SURGERY     2   . INSERTION OF DIALYSIS CATHETER Right 10/15/2020   Procedure: INSERTION OF TUNNELED DIALYSIS CATHETER;  Surgeon: Cherre Robins, MD;  Location: [place];  Service: Vascular;  Laterality: Right;  . LOOP RECORDER IMPLANT N/A 11/14/2013   Procedure: LOOP RECORDER IMPLANT;  Surgeon: Evans Lance, MD;  Location: [facility] CATH LAB;  Service: Cardiovascular;  Laterality: N/A;  . REVISON OF ARTERIOVENOUS FISTULA Right 10/15/2020   Procedure: REVISON OF ARTERIOVENOUS FISTULA ARM;  Surgeon: Cherre Robins, MD;  Location[facility];  Service: Vascular;  Laterality: Right;  . TEE WITHOUT CARDIOVERSION N/A 11/14/2013   Procedure: TRANSESOPHAGEAL ECHOCARDIOGRAM (TEE);  Surgeon: Candee Furbish, MD;  Location: [facility] ENDOSCOPY;  Service: Cardiovascular;  Laterality: N/A;  . VENTRICULOPERITONEAL SHUNT  03/27/2012   Procedure: SHUNT INSERTION VENTRICULAR-PERITONEAL;  Surgeon: Winfield Cunas, MD;  Location: [place] NEURO ORS;  Service: Neurosurgery;  Laterality: N/A;   Ventricular-Peritoneal Shunt Insertion     Home Medications:  Prior to Admission medications   Medication Sig Start Date End Date Taking? Authorizing Provider  aspirin 81 MG EC tablet Take 81 mg by mouth every 8 (eight) hours as needed for pain. 12/31/17  Yes [provider]  calcium acetate (PHOSLO) 667 MG capsule Take 667 mg by mouth 3 (three) times daily. 11/28/19  Yes [provider]  metoprolol tartrate (LOPRESSOR) 25 MG tablet Take 1 tablet (25 mg total) by mouth 2 (two) times daily. 10/16/20  Yes Annita Brod, MD  rosuvastatin (CRESTOR) 10 MG tablet Take 10 mg by mouth daily. 10/16/20  Yes [provider]  amiodarone (PACERONE) 200 MG tablet Take 2 tabs ('400mg'$ ) daily for 7 days, then 1 tab ('200mg'$ ) daily thereafter Patient not taking: Reported on 11/09/2020 03/24/18   Mendel Corning, MD  calcitRIOL (ROCALTROL) 0.5 MCG capsule Take 1 capsule (0.5 mcg total) by mouth Every Tuesday,Thursday,and Saturday with dialysis. Patient not taking: Reported on 11/09/2020 02/05/19   Modena Jansky, MD  nitroGLYCERIN (NITROSTAT) 0.4 MG SL tablet Place 1 tablet (0.4 mg total) under the tongue every 5 (five) minutes as needed for chest pain. Patient not taking: Reported on 11/09/2020 05/18/13  Janece Canterbury, MD  sucroferric oxyhydroxide (VELPHORO) 500 MG chewable tablet Chew 2 tablets (1,000 mg total) by mouth 3 (three) times daily with meals. Patient not taking: Reported on 11/09/2020 02/04/19   Modena Jansky, MD    Inpatient Medications: Scheduled Meds: . bisacodyl  10 mg Oral Daily  . calcitRIOL  1 mcg Oral Q T,Th,Sa-HD  . chlorhexidine  60 mL Topical Once  . Chlorhexidine Gluconate Cloth  6 each Topical Q0600  . darbepoetin (ARANESP) injection - NON-DIALYSIS  100 mcg Subcutaneous Q Sat-1800  . feeding supplement  296 mL Oral Once  . heparin injection (subcutaneous)  5,000 Units Subcutaneous Q8H  . midodrine  5 mg Oral BID WC  . pantoprazole (PROTONIX) IV  40 mg  Intravenous Q12H  . polyethylene glycol  17 g Oral BID  . povidone-iodine  2 application Topical Once   Continuous Infusions: . amiodarone 30 mg/hr (11/12/20 0540)  . ceFEPime (MAXIPIME) IV    . levETIRAcetam 500 mg (11/12/20 1255)  . remdesivir 100 mg in NS 100 mL 100 mg (11/12/20 0801)  . [START ON 11/13/2020] vancomycin     PRN Meds: [DISCONTINUED] acetaminophen **OR** acetaminophen, acetaminophen, fentaNYL (SUBLIMAZE) injection, prochlorperazine, sodium chloride flush  Allergies:    Allergies  Allergen Reactions  . Amoxicillin Hives and Rash    Informed by pt's daughter [name]   . Penicillins Hives and Rash    Has patient had a PCN reaction causing immediate rash, facial/tongue/throat swelling, SOB or lightheadedness with hypotension: Yes Has patient had a PCN reaction causing severe rash involving mucus membranes or skin necrosis: No Has patient had a PCN reaction that required hospitalization: No Has patient had a PCN reaction occurring within the last 10 years: No If all of the above answers are "NO", then may proceed with Cephalosporin use.     Social History:   Social History   Socioeconomic History  . Marital status: Single    Spouse name: Not on file  . Number of children: Not on file  . Years of education: Not on file  . Highest education level: Not on file  Occupational History  . Not on file  Tobacco Use  . Smoking status: Former Smoker    Packs/day: 0.20    Years: 35.00    Pack years: 7.00    Types: Cigarettes    Quit date: 03/20/2017    Years since quitting: 3.6  . Smokeless tobacco: Never Used  . Tobacco comment: 1/2 ppd   Vaping Use  . Vaping Use: Never used  Substance and Sexual Activity  . Alcohol use: No  . Drug use: No  . Sexual activity: Never  Other Topics Concern  . Not on file  Social History Narrative   Lives by herself but helps care for her 8 grandchildren.    Social Determinants of Health   Financial Resource Strain:  Medium Risk  . Difficulty of Paying Living Expenses: Somewhat hard  Food Insecurity: No Food Insecurity  . Worried About Charity fundraiser in the Last Year: Never true  . Ran Out of Food in the Last Year: Never true  Transportation Needs: Unmet Transportation Needs  . Lack of Transportation (Medical): Yes  . Lack of Transportation (Non-Medical): Yes  Physical Activity: Not on file  Stress: Not on file  Social Connections: Not on file  Intimate Partner Violence: Not on file    Family History:   Family History  Problem Relation Age of Onset  . Heart  disease Mother   . Heart disease Father   . Coronary artery disease Other        Premature in 1st degree relatives     ROS:  Poor historian - unreliable, so unable to fully assess ROS.   Physical Exam/Data:   Vitals:   11/12/20 0700 11/12/20 0800 11/12/20 0900 11/12/20 0930  BP: 112/68 138/65 128/77 111/89  Pulse: 66 89 69 77  Resp: 15 (!) 24 15 (!) 25  Temp: 97.7 F (36.5 C)     TempSrc: Axillary     SpO2: 95% 97% 100% 94%  Weight:        Intake/Output Summary (Last 24 hours) at 11/12/2020 1408 Last data filed at 11/12/2020 0900 Gross per 24 hour  Intake 990.82 ml  Output 900 ml  Net 90.82 ml   Last 3 Weights 11/12/2020 11/11/2020 11/11/2020  Weight (lbs) 95 lb 14.4 oz 95 lb 10.9 oz 97 lb 7.1 oz  Weight (kg) 43.5 kg 43.4 kg 44.2 kg  Some encounter information is confidential and restricted. Go to Review Flowsheets activity to see all data.     Body mass index is 18.12 kg/m.  Exam will be outlined below per MD   EKG:  The EKG was personally reviewed and demonstrates:  coarse atrial fib 103bpm, possible LVH with nonspecific TW changes V6  Telemetry:  Telemetry was personally reviewed and demonstrates: AFib then conversion to NSR overnight around 2:20am  Relevant CV Studies:  2D echo 10/07/20  1. Left ventricular ejection fraction, by estimation, is 65 to 70%. The  left ventricle has normal function. The left  ventricle has no regional  wall motion abnormalities. There is severe left ventricular hypertrophy.  Left ventricular diastolic parameters  are indeterminate. Elevated left atrial pressure.  2. Right ventricular systolic function is normal. The right ventricular  size is normal. There is normal pulmonary artery systolic pressure.  3. Left atrial size was mildly dilated.  4. The mitral valve is normal in structure. No evidence of mitral valve  regurgitation. No evidence of mitral stenosis.  5. The aortic valve is tricuspid. There is mild calcification of the  aortic valve. There is mild thickening of the aortic valve. Aortic valve  regurgitation is not visualized. No aortic stenosis is present.  6. The inferior vena cava is normal in size with greater than 50%  respiratory variability, suggesting right atrial pressure of 3 mmHg.     Laboratory Data:  High Sensitivity Troponin:   Recent Labs  Lab 11/11/20 2110 11/12/20 0742  TROPONINIHS 493* 399*     Chemistry Recent Labs  Lab 11/11/20 0530 11/11/20 2110 11/12/20 0742  NA 133* 132* 135  K 4.2 3.3* 3.6  CL 91* 94* 95*  CO2 20* 21* 23  GLUCOSE 86 129* 108*  BUN 98* 41* 53*  CREATININE 11.75* 6.48* 7.47*  CALCIUM 9.2 8.6* 9.2  GFRNONAA 3* 7* 6*  ANIONGAP 22* 17* 17*    Recent Labs  Lab 11/10/20 0431 11/11/20 0530 11/12/20 0742  PROT 5.4* 5.6* 5.6*  ALBUMIN 2.3* 2.2* 2.2*  AST '20 19 25  '$ ALT '16 21 24  '$ ALKPHOS 83 77 71  BILITOT 0.8 0.6 0.4   Hematology Recent Labs  Lab 11/10/20 0431 11/11/20 0530 11/11/20 2110  WBC 6.1 6.0 6.5  RBC 3.46* 3.95 4.68  HGB 9.7* 11.5* 13.0  HCT 29.1* 32.8* 38.5  MCV 84.1 83.0 82.3  MCH 28.0 29.1 27.8  MCHC 33.3 35.1 33.8  RDW 17.9* 18.2* 18.0*  PLT 239 328 387   BNP Recent Labs  Lab 11/10/20 0431 11/11/20 0530 11/11/20 2110  BNP 2,805.6* 3,906.9* >4,500.0*    DDimer  Recent Labs  Lab 11/10/20 0431 11/11/20 0530 11/12/20 0742  DDIMER 1.45* 3.99* 3.11*      Radiology/Studies:  CT ABDOMEN PELVIS WO CONTRAST  Result Date: 11/09/2020 CLINICAL DATA:  Acute generalized abdominal pain. EXAM: CT ABDOMEN AND PELVIS WITHOUT CONTRAST TECHNIQUE: Multidetector CT imaging of the abdomen and pelvis was performed following the standard protocol without IV contrast. COMPARISON:  November 08, 2020. FINDINGS: Lower chest: Increased left basilar opacity is noted concerning for worsening pneumonia or atelectasis. Mild right posterior basilar subsegmental atelectasis is noted. Hepatobiliary: No gallstones or biliary dilatation is noted. Multiple rounded hepatic low densities are noted most consistent with cysts. Pancreas: No definite evidence of acute inflammation is noted. Stable mild nonspecific ductal dilatation is noted. Spleen: Normal in size without focal abnormality. Adrenals/Urinary Tract: Adrenal glands appear normal. Bilateral renal atrophy is noted consistent with end-stage renal disease. Left renal cysts are noted. No hydronephrosis or renal obstruction is noted. Urinary bladder is unremarkable. Stomach/Bowel: Stomach is within normal limits. Appendix appears normal. Large amount of stool is again noted in the rectum concerning for impaction. No abnormal bowel dilatation is otherwise noted. Vascular/Lymphatic: Aortic atherosclerosis. No enlarged abdominal or pelvic lymph nodes. Reproductive: Status post hysterectomy. No adnexal masses. Other: Ventriculoperitoneal shunt is noted with tip in left side of abdomen. No hernia or ascites is noted. Musculoskeletal: No acute or significant osseous findings. IMPRESSION: 1. Increased left basilar opacity is noted concerning for worsening pneumonia or atelectasis. 2. Bilateral renal atrophy is noted consistent with end-stage renal disease. 3. Aortic atherosclerosis. 4. Large amount of stool is again noted in the rectum concerning for impaction. Aortic Atherosclerosis (ICD10-I70.0). Electronically Signed   By: Marijo Conception  M.D.   On: 11/09/2020 15:05   DG Chest 2 View  Result Date: 11/08/2020 CLINICAL DATA:  Nausea x1 day. EXAM: CHEST - 2 VIEW COMPARISON:  October 15, 2020 FINDINGS: The lateral views limited in evaluation secondary to positioning of the patient's upper extremities. There is stable right-sided venous catheter positioning. Tubing from a ventriculoperitoneal shunt is also seen. Mild atelectasis and/or infiltrate is seen within the retrocardiac region of the left lung base. There is no evidence of a pleural effusion or pneumothorax. The cardiac silhouette is mildly enlarged. Marked severity calcification of the thoracic aorta is seen. The visualized skeletal structures are unremarkable. IMPRESSION: Mild left basilar atelectasis and/or infiltrate. Electronically Signed   By: Virgina Norfolk M.D.   On: 11/08/2020 19:52   DG Wrist 2 Views Left  Result Date: 11/09/2020 CLINICAL DATA:  Fall EXAM: LEFT WRIST - 2 VIEW COMPARISON:  None. FINDINGS: There is a comminuted impacted distal radius fracture with slight posterior angulation. There is a non displaced ulnar styloid fracture. Overlying soft tissue swelling is seen. IMPRESSION: Comminuted impacted distal radius fracture. Nondisplaced ulnar styloid fracture. Electronically Signed   By: Prudencio Pair M.D.   On: 11/09/2020 02:22   CT ABDOMEN PELVIS W CONTRAST  Result Date: 11/08/2020 CLINICAL DATA:  Epigastric pain EXAM: CT ABDOMEN AND PELVIS WITH CONTRAST TECHNIQUE: Multidetector CT imaging of the abdomen and pelvis was performed using the standard protocol following bolus administration of intravenous contrast. CONTRAST:  65m OMNIPAQUE IOHEXOL 300 MG/ML  SOLN COMPARISON:  None. FINDINGS: Lower chest: The visualized heart size within normal limits. No pericardial fluid/thickening. No hiatal hernia. A small left pleural effusion is  seen. Patchy airspace opacity seen at the left lung base. Hepatobiliary: Multiple low-density lesions are seen throughout the liver  parenchyma the largest measuring 1.2 cm in the superior right liver lobe.The main portal vein is patent. No evidence of calcified gallstones, gallbladder wall thickening or biliary dilatation. Pancreas: There is mild pancreatic ductal dilatation measuring up to 2 mm. This is nonspecific however. Spleen: Normal in size without focal abnormality. Adrenals/Urinary Tract: Both adrenal glands appear normal. Mild bilateral renal atrophy seen. There is scattered calcification the largest measuring 3 mm in the midpole of the right kidney. No hydronephrosis. Bladder is unremarkable. Stomach/Bowel: The stomach, small bowel, are normal in appearance. There is scattered colonic diverticula. There is a moderately distended rectal fecal ball. Vascular/Lymphatic: There are no enlarged mesenteric, retroperitoneal, or pelvic lymph nodes. Scattered aortic atherosclerotic calcifications are seen without aneurysmal dilatation. Reproductive: The patient is status post hysterectomy. No adnexal masses or collections seen. Other: No evidence of abdominal wall mass or hernia. Musculoskeletal: No acute or significant osseous findings. IMPRESSION: Small left pleural effusion with patchy airspace opacity at the left lung base which may be due to atelectasis and/or infectious etiology Nonspecific mild pancreatic ductal dilatation Diverticulosis without diverticulitis Moderately dilated rectum with a fecal ball Aortic Atherosclerosis (ICD10-I70.0). Electronically Signed   By: Prudencio Pair M.D.   On: 11/08/2020 20:34   DG Chest Port 1 View  Result Date: 11/09/2020 CLINICAL DATA:  Shortness of breath EXAM: PORTABLE CHEST 1 VIEW COMPARISON:  November 08, 2020 FINDINGS: Central catheter tip is at the cavoatrial junction. There is a intraperitoneal shunt catheter extending along the medial right hemithorax, stable. There is ill-defined airspace opacity in the left lower lobe. Lungs elsewhere clear. Heart is mildly enlarged with pulmonary venous  hypertension. There is aortic atherosclerosis. No adenopathy. There is a loop recorder on the left. No bone lesions. IMPRESSION: Central catheter position unchanged. No pneumothorax. Airspace opacity likely representing pneumonia left lower lobe. Lungs elsewhere clear. There is mild cardiomegaly with a degree of pulmonary vascular congestion. Aortic Atherosclerosis (ICD10-I70.0). Electronically Signed   By: Lowella Grip III M.D.   On: 11/09/2020 08:37   DG Abd Portable 1V  Result Date: 11/09/2020 CLINICAL DATA:  Abdominal pain, anemia EXAM: PORTABLE ABDOMEN - 1 VIEW COMPARISON:  11/08/2020 CT abdomen/pelvis FINDINGS: No disproportionately dilated small bowel loops. Moderate rectal stool, similar. Mild to moderate colonic stool and gas. No evidence of pneumatosis or pneumoperitoneum. Right-sided VP shunt catheter terminates in the left abdomen and appears intact and without kink. No radiopaque nephrolithiasis. IMPRESSION: Nonobstructive bowel gas pattern. Moderate rectal stool, similar to recent CT. Mild to moderate colonic stool and gas. Electronically Signed   By: Ilona Sorrel M.D.   On: 11/09/2020 10:11   [place] EKG SITE RITE  Result Date: 11/09/2020 If Site Rite image not attached, placement could not be confirmed due to current cardiac rhythm.    Assessment and Plan:   1. Acute Covid-19 infection with possible early pneumonia, maintaining oxygenation 2. Epigastric abdominal pain associated with increased stool volume and elevated lipase, s/p disimpaction 3. Acute on chronic anemia with drop in Hgb to 6.5 this admission, s/p transfusion 4. Left wrist fracture/possible fall several weeks ago, pending possible ORIF later this week 5. Paroxysmal atrial fibrillation with history of such 6. ESRD on HD 7. Elevated troponin 8. Inability to care for self  Patient note prepped remotely to minimize patient/provider exposure with Covid- MD note to follow. Patient noted to be poor historian this  admission which  is in line with prior documentation. She presented with above medical issues and recurrent atrial fib RVR. She has since converted to NSR on IV amiodarone. Home metoprolol is on hold at this time; started on midodrine for hypotension. Not clear if she is actually taking these medications at home. She is also noted to have suppressed TSH so have ordered further thyroid studies if she will comply given her ongoing amiodarone usage. She is not a candidate for anticoagulation both historically and currently given h/o ICH as well as acute anemia issues this admission. She was incidentally found to have elevated troponin in the context of the above. Suspect demand process superimposed on history of multivessel coronary artery disease - previous NSTEMIs have been managed conservatively so anticipate this will be the case as well. Her intermittent refusal of care also makes management challenging. Will discuss further management with Dr. Harrell Gave.   Risk Assessment/Risk Scores:        CHA2DS2-VASc Score = 6  This indicates a 9.7% annual risk of stroke. The patient's score is based upon: CHF History: Yes HTN History: Yes Diabetes History: No Stroke History: Yes Vascular Disease History: Yes Age Score: 0 Gender Score: 1     For questions or updates, please contact [place] Please consult [email or web address] for contact info under    Signed, Charlie Pitter, PA-C  11/12/2020 2:08 PM

## 2020-11-12 NOTE — Progress Notes (Signed)
Patient ID: [name], female   DOB: 1961/10/11, 60 y.o.   MRN: [id number]   LOS: 3 days   Subjective: Pt still having severe pain in wrist, will not move fingers 2/2 pain. Splint in good position.    Objective: Vital signs in last 24 hours: Temp:  [97.6 F (36.4 C)-98.4 F (36.9 C)] 97.7 F (36.5 C) (01/24 0700) Pulse Rate:  [62-126] 77 (01/24 0930) Resp:  [15-28] 25 (01/24 0930) BP: (74-148)/(42-99) 111/89 (01/24 0930) SpO2:  [91 %-100 %] 94 % (01/24 0930) Weight:  [43.4 kg-44.2 kg] 43.5 kg (01/24 0515) Last BM Date: 11/12/20   Laboratory  CBC Recent Labs    11/11/20 0530 11/11/20 2110  WBC 6.0 6.5  HGB 11.5* 13.0  HCT 32.8* 38.5  PLT 328 387   BMET Recent Labs    11/11/20 2110 11/12/20 0742  NA 132* 135  K 3.3* 3.6  CL 94* 95*  CO2 21* 23  GLUCOSE 129* 108*  BUN 41* 53*  CREATININE 6.48* 7.47*  CALCIUM 8.6* 9.2     Physical Exam General appearance: alert and no distress  Left wrist: Splint intact, fingers cap refill<2s   Assessment/Plan: Left wrist fx -- Plan ORIF Thursday by Dr. Jeannie Fend. Please keep NPO after MN Wednesday.    Lisette Abu, PA-C Orthopedic Surgery [phone] 11/12/2020

## 2020-11-12 NOTE — Progress Notes (Signed)
Patient refused AM lab draw and became combative with attempts. Educated patient. Patient refused.

## 2020-11-12 NOTE — Progress Notes (Signed)
PROGRESS NOTE                                                                                                                                                                                                             Patient Demographics:    [name], is a 60 y.o. female, DOB - September 18, 1961, [id number]  Outpatient Primary MD for the patient is Clovia Cuff, MD    LOS - 3  Admit date - 11/08/2020    Chief Complaint  Patient presents with  . Nausea       Brief Narrative (HPI from H&P)  [name] is a 60 y.o. female with medical history significant of ESRD on HD TTS, CAD, hypertension, hyperlipidemia, paroxysmal A. 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  She has not been vaccinated for COVID-19, in the ER she was found to be in A. fib with RVR, skipped dialysis treatment, questionable COVID-pneumonia, soon she developed worsening abdominal pain and a rapid drop in her H&H.  Currently nephrology, GI, critical care have been consulted.  Patient is extremely tenuous.   Subjective:   Patient in bed, appears comfortable, denies any headache, no fever, no chest pain or pressure, no shortness of breath , no abdominal pain. No focal weakness.    Assessment  & Plan :     1. Acute COVID-19 infection with possible early pneumonia in a patient who is not vaccinated - she is currently not hypoxic and this seems to be a secondary issue, will continue to monitor.  Total 10-day quarantine due to lack of symptoms, she will be off of quarantine on 11/18/2020.  Encouraged the patient to sit up in chair in the daytime use I-S and flutter valve for pulmonary toiletry and then prone in bed when at night.  Will advance activity and titrate down  oxygen as possible.  SpO2: 94 %  Recent Labs  Lab 11/08/20 1404 11/08/20 1404 11/08/20 1814 11/09/20 0007 11/09/20 0806 11/09/20 1008 11/09/20 1340 11/09/20 2241 11/10/20 0431 11/11/20 0530 11/11/20 2110 11/12/20 0742  WBC 6.8  --   --   --  5.9  --   --   --  6.1 6.0  6.5  --   HGB 11.0*  --   --   --  6.8* 6.5*  --  10.7* 9.7* 11.5* 13.0  --   HCT 33.5*  --   --   --  20.6* 20.0*  --  31.8* 29.1* 32.8* 38.5  --   PLT 287  --   --   --  255  --  249  --  239 328 387  --   CRP  --   --   --  23.2* 23.2*  --   --   --  17.7* 16.5* 16.3*  --   BNP  --   --   --   --  3,445.5*  --   --   --  2,805.6* 3,906.9* >4,500.0*  --   DDIMER  --    < >  --  1.91* 2.21*  --  1.92*  --  1.45* 3.99*  --  3.11*  PROCALCITON 14.27  --   --   --  29.06  --   --   --  31.19 27.21  --   --   AST 38  --   --   --  30  --   --   --  20 19  --  25  ALT 18  --   --   --  18  --   --   --  16 21  --  24  ALKPHOS 61  --   --   --  55  --   --   --  83 77  --  71  BILITOT 0.9  --   --   --  0.7  --   --   --  0.8 0.6  --  0.4  ALBUMIN 3.3*  --   --   --  2.6*  --   --   --  2.3* 2.2*  --  2.2*  INR  --   --   --   --   --   --  1.3*  --   --   --   --   --   SARSCOV2NAA  --   --  POSITIVE*  --   --   --   --   --   --   --   --   --    < > = values in this interval not displayed.    2. Epigastric abdominal pain ongoing for 12 hours with a drop in H&H.  On IV PPI, CT scan done few hours ago was nonacute, KUB this morning does not show any free air under the diaphragm.  Her abdomen is tender on exam, GI is following, will repeat CT scan to rule out retroperitoneal bleed.  Lipase has bumped on 11/10/2020 but less than 3X from top normal, improved on 11/11/20, will defer this to GI.  LFTs are stable, CT did not show any pancreatic enhancement.  3. acute on chronic anemia.  Baseline anemia of chronic disease due to ESRD, acute drop unexplained, did have significant abdominal discomfort, CT scan abdomen pelvis  negative x2 this admission for retroperitoneal bleed or acute changes, GI on board, status post 2 units of packed RBC transfusion on 11/09/2020 with stable posttransfusion H&H, on PPI IV which will be continued, no signs of ongoing bleed. Stable  DIC panel.  4. ESRD.  On TTS schedule, missed dialysis on 11/08/2020.  Renal consulted.  5. left wrist pain and fracture.  Maintain splint, Ortho consulted on 11/09/2020, still has some pain and reduced range of motion in her left fingers, Ortho following and informed.  Patient tells me on 11/10/2020 that she fell at home 3 to 4 weeks ago and that is when she hurt her wrist.  6. lack of IV access.  PCCM consulted for line placement.  Right femoral line placed on 11/09/2020 - DC'd 11/11/20. Has a PIV.  7. history of seizures.  On Keppra.  8. A. fib RVR.  Has underlying paroxysmal A. fib, on Amio drip, [place] vas 2 score of greater than 3.  Since H&H is stable and recent suspicion for unknown blood loss will be cautious in starting anticoagulation right now.  Will start with prophylactic heparin. Cards consulted.  9. Generalized weakness.  Supportive care.  PT OT.   10.  Large stool burden, disimpaction done, now on bowel regimen.  11. Chronic hypotension.  Low-dose midodrine.       Condition - Extremely Guarded  Family Communication  : daughter [name]  [phone] - 11/09/20 , 11/10/20 message left at 9:55 AM, 11/11/20 message left 10:09 AM, 11/12/20  Code Status :  Full  Consults  :  Renal, PCCM, GI, Ortho, Cards  Procedures  :    R. Femoral line placement by PCCM on 11/09/2020, [place] 11/11/20  CT - Small left pleural effusion with patchy airspace opacity at the left lung base which may be due to atelectasis and/or infectious etiology Nonspecific mild pancreatic ductal dilatation Diverticulosis without diverticulitis Moderately dilated rectum with a fecal ball Aortic Atherosclerosis   PUD Prophylaxis : IV PPI  Disposition Plan  :    Status is:  Inpt  Dispo: The patient is from: Home              Anticipated d/c is to: Home              Anticipated d/c date is: > 3 days              Patient currently is not medically stable to d/c.  DVT Prophylaxis  : SCDs >> Heparin 11/12/20 - monitor H&H closely  Lab Results  Component Value Date   PLT 387 11/11/2020    Diet :  Diet Order            Diet regular Room service appropriate? Yes with Assist; Fluid consistency: Thin  Diet effective now                  Inpatient Medications  Scheduled Meds: . bisacodyl  10 mg Oral Daily  . calcitRIOL  1 mcg Oral Q T,Th,Sa-HD  . Chlorhexidine Gluconate Cloth  6 each Topical Q0600  . darbepoetin (ARANESP) injection - NON-DIALYSIS  100 mcg Subcutaneous Q Sat-1800  . midodrine  5 mg Oral BID WC  . pantoprazole (PROTONIX) IV  40 mg Intravenous Q12H  . polyethylene glycol  17 g Oral BID   Continuous Infusions: . amiodarone 30 mg/hr (11/12/20 0540)  . ceFEPime (MAXIPIME) IV    . levETIRAcetam Stopped (11/12/20 0033)  . remdesivir 100 mg in NS 100 mL 100 mg (11/12/20 0801)   PRN Meds:.[DISCONTINUED] acetaminophen **OR** acetaminophen, acetaminophen, fentaNYL (SUBLIMAZE) injection, prochlorperazine, sodium chloride flush  Antibiotics  :    Anti-infectives (From admission, onward)   Start     Dose/Rate Route Frequency Ordered Stop   11/12/20 2200  ceFEPIme (MAXIPIME) 1 g in sodium chloride 0.9 % 100 mL IVPB  1 g 200 mL/hr over 30 Minutes Intravenous Every 24 hours 11/10/20 1311     11/11/20 1300  ceFEPIme (MAXIPIME) 1 g in sodium chloride 0.9 % 100 mL IVPB        1 g 200 mL/hr over 30 Minutes Intravenous  Once 11/10/20 1311 11/11/20 1423   11/11/20 0600  levofloxacin (LEVAQUIN) IVPB 500 mg  Status:  Discontinued        500 mg 100 mL/hr over 60 Minutes Intravenous Every 48 hours 11/09/20 0413 11/10/20 1312   11/10/20 1000  remdesivir 100 mg in sodium chloride 0.9 % 100 mL IVPB       "Followed by" Linked Group Details   100  mg 200 mL/hr over 30 Minutes Intravenous Daily 11/09/20 0023 11/14/20 0959   11/09/20 0500  levofloxacin (LEVAQUIN) IVPB 750 mg        750 mg 100 mL/hr over 90 Minutes Intravenous  Once 11/09/20 0413 11/09/20 1504   11/09/20 0100  remdesivir 200 mg in sodium chloride 0.9% 250 mL IVPB       "Followed by" Linked Group Details   200 mg 580 mL/hr over 30 Minutes Intravenous Once 11/09/20 0023 11/09/20 1046       Time Spent in minutes  30   Lala Lund M.D on 11/12/2020 at 10:31 AM  To page go to [email or web address]   [facility] -  Office  [phone]  See all Orders from today for further details    Objective:   Vitals:   11/12/20 0700 11/12/20 0800 11/12/20 0900 11/12/20 0930  BP: 112/68 138/65 128/77 111/89  Pulse: 66 89 69 77  Resp: 15 (!) 24 15 (!) 25  Temp: 97.7 F (36.5 C)     TempSrc: Axillary     SpO2: 95% 97% 100% 94%  Weight:        Wt Readings from Last 3 Encounters:  11/12/20 43.5 kg  10/26/20 40.7 kg  10/17/20 42.5 kg     Intake/Output Summary (Last 24 hours) at 11/12/2020 1031 Last data filed at 11/12/2020 0900 Gross per 24 hour  Intake 1240.82 ml  Output 900 ml  Net 340.82 ml     Physical Exam  Awake Alert, moving all 4 extremities but reduced range of motion noted in the left fingers, left wrist in splint limited ROM in the fingers, right IJ dialysis catheter. [place].AT,PERRAL Supple Neck,No JVD, No cervical lymphadenopathy appriciated.  Symmetrical Chest wall movement, Good air movement bilaterally, CTAB RRR,No Gallops, Rubs or new Murmurs, No Parasternal Heave +ve B.Sounds, Abd Soft, No tenderness, No organomegaly appriciated, No rebound - guarding or rigidity. No Cyanosis    Data Review:    CBC Recent Labs  Lab 11/08/20 1404 11/09/20 0806 11/09/20 1008 11/09/20 1340 11/09/20 2241 11/10/20 0431 11/11/20 0530 11/11/20 2110  WBC 6.8 5.9  --   --   --  6.1 6.0 6.5  HGB 11.0* 6.8* 6.5*  --  10.7* 9.7* 11.5* 13.0  HCT 33.5* 20.6*  20.0*  --  31.8* 29.1* 32.8* 38.5  PLT 287 255  --  249  --  239 328 387  MCV 90.1 88.0  --   --   --  84.1 83.0 82.3  MCH 29.6 29.1  --   --   --  28.0 29.1 27.8  MCHC 32.8 33.0  --   --   --  33.3 35.1 33.8  RDW 15.8* 15.8*  --   --   --  17.9* 18.2* 18.0*  LYMPHSABS  --   --   --   --   --  0.8 1.1 0.6*  MONOABS  --   --   --   --   --  0.3 0.4 0.4  EOSABS  --   --   --   --   --  0.0 0.0 0.0  BASOSABS  --   --   --   --   --  0.0 0.0 0.0    Recent Labs  Lab 11/08/20 1404 11/08/20 1404 11/09/20 0007 11/09/20 0806 11/09/20 1340 11/10/20 0431 11/11/20 0530 11/11/20 2110 11/12/20 0742  NA 140  --   --  134*  --  133* 133* 132* 135  K 4.1  --   --  4.1  --  4.3 4.2 3.3* 3.6  CL 91*  --   --  88*  --  91* 91* 94* 95*  CO2 21*  --   --  24  --  21* 20* 21* 23  GLUCOSE 136*  --   --  98  --  101* 86 129* 108*  BUN 53*  --   --  71*  --  80* 98* 41* 53*  CREATININE 9.53*  --   --  10.36*  --  10.53* 11.75* 6.48* 7.47*  CALCIUM 9.5  --   --  8.8*  --  8.8* 9.2 8.6* 9.2  AST 38  --   --  30  --  20 19  --  25  ALT 18  --   --  18  --  16 21  --  24  ALKPHOS 61  --   --  55  --  83 77  --  71  BILITOT 0.9  --   --  0.7  --  0.8 0.6  --  0.4  ALBUMIN 3.3*  --   --  2.6*  --  2.3* 2.2*  --  2.2*  MG 2.6*  --   --  2.5*  --  2.4 2.3 2.0 2.1  CRP  --   --  23.2* 23.2*  --  17.7* 16.5* 16.3*  --   DDIMER  --    < > 1.91* 2.21* 1.92* 1.45* 3.99*  --  3.11*  PROCALCITON 14.27  --   --  29.06  --  31.19 27.21  --   --   INR  --   --   --   --  1.3*  --   --   --   --   TSH  --   --   --   --   --   --   --  0.010*  --   BNP  --   --   --  3,445.5*  --  2,805.6* 3,906.9* >4,500.0*  --    < > = values in this interval not displayed.    ------------------------------------------------------------------------------------------------------------------ No results for input(s): CHOL, HDL, LDLCALC, TRIG, CHOLHDL, LDLDIRECT in the last 72 hours.  Lab Results  Component Value Date   HGBA1C  5.2 10/07/2020   ------------------------------------------------------------------------------------------------------------------ Recent Labs    11/11/20 2110  TSH 0.010*    Cardiac Enzymes No results for input(s): CKMB, TROPONINI, MYOGLOBIN in the last 168 hours.  Invalid input(s): CK ------------------------------------------------------------------------------------------------------------------    Component Value Date/Time   BNP >4,500.0 (H) 11/11/2020 2110    Micro Results Recent Results (from the past 240 hour(s))  SARS Coronavirus 2 by RT PCR (hospital order, performed in [facility] hospital lab) Nasopharyngeal Nasopharyngeal Swab     Status: Abnormal   Collection Time:  11/08/20  6:14 PM   Specimen: Nasopharyngeal Swab  Result Value Ref Range Status   SARS Coronavirus 2 POSITIVE (A) NEGATIVE Final    Comment: RESULT CALLED TO, READ BACK BY AND VERIFIED WITH: Diannia Ruder RN 11/08/20 2359 JDW (NOTE) SARS-CoV-2 target nucleic acids are DETECTED  SARS-CoV-2 RNA is generally detectable in upper respiratory specimens  during the acute phase of infection.  Positive results are indicative  of the presence of the identified virus, but do not rule out bacterial infection or co-infection with other pathogens not detected by the test.  Clinical correlation with patient history and  other diagnostic information is necessary to determine patient infection status.  The expected result is negative.  Fact Sheet for Patients:   StrictlyIdeas.no   Fact Sheet for Healthcare Providers:   BankingDealers.co.za    This test is not yet approved or cleared by the [place] FDA and  has been authorized for detection and/or diagnosis of SARS-CoV-2 by FDA under an Emergency Use Authorization (EUA).  This EUA will remain in effect (meaning this test can  be used) for the duration of  the COVID-19 declaration under Section 564(b)(1) of the  Act, 21 U.S.C. section 360-bbb-3(b)(1), unless the authorization is terminated or revoked sooner.  Performed at [facility], [place] [address], [place], [place] [address]     Radiology Reports CT ABDOMEN PELVIS WO CONTRAST  Result Date: 11/09/2020 CLINICAL DATA:  Acute generalized abdominal pain. EXAM: CT ABDOMEN AND PELVIS WITHOUT CONTRAST TECHNIQUE: Multidetector CT imaging of the abdomen and pelvis was performed following the standard protocol without IV contrast. COMPARISON:  November 08, 2020. FINDINGS: Lower chest: Increased left basilar opacity is noted concerning for worsening pneumonia or atelectasis. Mild right posterior basilar subsegmental atelectasis is noted. Hepatobiliary: No gallstones or biliary dilatation is noted. Multiple rounded hepatic low densities are noted most consistent with cysts. Pancreas: No definite evidence of acute inflammation is noted. Stable mild nonspecific ductal dilatation is noted. Spleen: Normal in size without focal abnormality. Adrenals/Urinary Tract: Adrenal glands appear normal. Bilateral renal atrophy is noted consistent with end-stage renal disease. Left renal cysts are noted. No hydronephrosis or renal obstruction is noted. Urinary bladder is unremarkable. Stomach/Bowel: Stomach is within normal limits. Appendix appears normal. Large amount of stool is again noted in the rectum concerning for impaction. No abnormal bowel dilatation is otherwise noted. Vascular/Lymphatic: Aortic atherosclerosis. No enlarged abdominal or pelvic lymph nodes. Reproductive: Status post hysterectomy. No adnexal masses. Other: Ventriculoperitoneal shunt is noted with tip in left side of abdomen. No hernia or ascites is noted. Musculoskeletal: No acute or significant osseous findings. IMPRESSION: 1. Increased left basilar opacity is noted concerning for worsening pneumonia or atelectasis. 2. Bilateral renal atrophy is noted consistent with end-stage renal disease. 3. Aortic  atherosclerosis. 4. Large amount of stool is again noted in the rectum concerning for impaction. Aortic Atherosclerosis (ICD10-I70.0). Electronically Signed   By: Marijo Conception M.D.   On: 11/09/2020 15:05   DG Chest 2 View  Result Date: 11/08/2020 CLINICAL DATA:  Nausea x1 day. EXAM: CHEST - 2 VIEW COMPARISON:  October 15, 2020 FINDINGS: The lateral views limited in evaluation secondary to positioning of the patient's upper extremities. There is stable right-sided venous catheter positioning. Tubing from a ventriculoperitoneal shunt is also seen. Mild atelectasis and/or infiltrate is seen within the retrocardiac region of the left lung base. There is no evidence of a pleural effusion or pneumothorax. The cardiac silhouette is mildly enlarged. Marked severity calcification  of the thoracic aorta is seen. The visualized skeletal structures are unremarkable. IMPRESSION: Mild left basilar atelectasis and/or infiltrate. Electronically Signed   By: Virgina Norfolk M.D.   On: 11/08/2020 19:52   DG Wrist 2 Views Left  Result Date: 11/09/2020 CLINICAL DATA:  Fall EXAM: LEFT WRIST - 2 VIEW COMPARISON:  None. FINDINGS: There is a comminuted impacted distal radius fracture with slight posterior angulation. There is a non displaced ulnar styloid fracture. Overlying soft tissue swelling is seen. IMPRESSION: Comminuted impacted distal radius fracture. Nondisplaced ulnar styloid fracture. Electronically Signed   By: Prudencio Pair M.D.   On: 11/09/2020 02:22   CT ABDOMEN PELVIS W CONTRAST  Result Date: 11/08/2020 CLINICAL DATA:  Epigastric pain EXAM: CT ABDOMEN AND PELVIS WITH CONTRAST TECHNIQUE: Multidetector CT imaging of the abdomen and pelvis was performed using the standard protocol following bolus administration of intravenous contrast. CONTRAST:  33m OMNIPAQUE IOHEXOL 300 MG/ML  SOLN COMPARISON:  None. FINDINGS: Lower chest: The visualized heart size within normal limits. No pericardial fluid/thickening. No  hiatal hernia. A small left pleural effusion is seen. Patchy airspace opacity seen at the left lung base. Hepatobiliary: Multiple low-density lesions are seen throughout the liver parenchyma the largest measuring 1.2 cm in the superior right liver lobe.The main portal vein is patent. No evidence of calcified gallstones, gallbladder wall thickening or biliary dilatation. Pancreas: There is mild pancreatic ductal dilatation measuring up to 2 mm. This is nonspecific however. Spleen: Normal in size without focal abnormality. Adrenals/Urinary Tract: Both adrenal glands appear normal. Mild bilateral renal atrophy seen. There is scattered calcification the largest measuring 3 mm in the midpole of the right kidney. No hydronephrosis. Bladder is unremarkable. Stomach/Bowel: The stomach, small bowel, are normal in appearance. There is scattered colonic diverticula. There is a moderately distended rectal fecal ball. Vascular/Lymphatic: There are no enlarged mesenteric, retroperitoneal, or pelvic lymph nodes. Scattered aortic atherosclerotic calcifications are seen without aneurysmal dilatation. Reproductive: The patient is status post hysterectomy. No adnexal masses or collections seen. Other: No evidence of abdominal wall mass or hernia. Musculoskeletal: No acute or significant osseous findings. IMPRESSION: Small left pleural effusion with patchy airspace opacity at the left lung base which may be due to atelectasis and/or infectious etiology Nonspecific mild pancreatic ductal dilatation Diverticulosis without diverticulitis Moderately dilated rectum with a fecal ball Aortic Atherosclerosis (ICD10-I70.0). Electronically Signed   By: BPrudencio PairM.D.   On: 11/08/2020 20:34   DG Chest Port 1 View  Result Date: 11/09/2020 CLINICAL DATA:  Shortness of breath EXAM: PORTABLE CHEST 1 VIEW COMPARISON:  November 08, 2020 FINDINGS: Central catheter tip is at the cavoatrial junction. There is a intraperitoneal shunt catheter  extending along the medial right hemithorax, stable. There is ill-defined airspace opacity in the left lower lobe. Lungs elsewhere clear. Heart is mildly enlarged with pulmonary venous hypertension. There is aortic atherosclerosis. No adenopathy. There is a loop recorder on the left. No bone lesions. IMPRESSION: Central catheter position unchanged. No pneumothorax. Airspace opacity likely representing pneumonia left lower lobe. Lungs elsewhere clear. There is mild cardiomegaly with a degree of pulmonary vascular congestion. Aortic Atherosclerosis (ICD10-I70.0). Electronically Signed   By: WLowella GripIII M.D.   On: 11/09/2020 08:37   DG Chest Port 1 View  Result Date: 10/15/2020 CLINICAL DATA:  Post dialysis catheter placement EXAM: PORTABLE CHEST 1 VIEW COMPARISON:  10/06/2020 FINDINGS: New right IJ tunneled dialysis catheter tip overlies the cavoatrial junction. Partially imaged shunt catheter traversing the right chest. No new consolidation.  No pleural effusion or pneumothorax. Stable cardiomediastinal contours. IMPRESSION: New right IJ tunneled dialysis catheter tip overlies the cavoatrial junction. No pneumothorax. Electronically Signed   By: Macy Mis M.D.   On: 10/15/2020 13:22   DG Abd Portable 1V  Result Date: 11/09/2020 CLINICAL DATA:  Abdominal pain, anemia EXAM: PORTABLE ABDOMEN - 1 VIEW COMPARISON:  11/08/2020 CT abdomen/pelvis FINDINGS: No disproportionately dilated small bowel loops. Moderate rectal stool, similar. Mild to moderate colonic stool and gas. No evidence of pneumatosis or pneumoperitoneum. Right-sided VP shunt catheter terminates in the left abdomen and appears intact and without kink. No radiopaque nephrolithiasis. IMPRESSION: Nonobstructive bowel gas pattern. Moderate rectal stool, similar to recent CT. Mild to moderate colonic stool and gas. Electronically Signed   By: Ilona Sorrel M.D.   On: 11/09/2020 10:11   DG Fluoro Guide CV Line-No Report  Result Date:  10/15/2020 Fluoroscopy was utilized by the requesting physician.  No radiographic interpretation.   [place] EKG SITE RITE  Result Date: 11/09/2020 If Site Rite image not attached, placement could not be confirmed due to current cardiac rhythm.

## 2020-11-12 NOTE — Evaluation (Signed)
Occupational Therapy Evaluation Patient Details Name: [name] MRN: [id number] DOB: [birth year] Today's Date: 11/12/2020    History of Present Illness 60 y.o. female with medical history significant of ESRD on HD TTS, CAD, hypertension, hyperlipidemia, paroxysmal A. 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 Found to be in A-fib with RVR, unvaccinated, COVID+, CT showing small L pleural effusion at the L lung base. Reports L wrist pain from daughter falling on her wrist prior to coming to ED found to have fx and  L UE wrist splint applied in ED. NWB will eventually require ORIF. Rapid response 11/09/20 transfer to ICU, placement of untunneled R femoral central venous catheter, D/c'd 11/11/20   Clinical Impression   Pt PTA: Pt living with daughter- pt is a poor historian and unsure if information is valid. Pt reports sometimes that she was assisted with ADL and other times that she was not assisted at home. Pt reports that her daughter was there all of the time.  Pt currently, Pt limited by decreased strength, decreased ability to care for self, decreased activity tolerance and decreased cognition to follow commands. Pt's HR increasing to 135 BPM with exertion, O2 remained >90% on RA. Pt minA to [place] for ADL and modA  For rolling in bed. Pt unable to attempt sitting EOB due to pain after cleaning up large BM in bed. Pt would benefit from continued OT skilled services for ADL, mobility and safety in SNF setting. OT following acutely.      Follow Up Recommendations  SNF;Supervision/Assistance - 24 hour    Equipment Recommendations  3 in 1 bedside commode    Recommendations for Other Services       Precautions / Restrictions Precautions Precautions: Fall Required Braces or Orthoses:  Splint/Cast Splint/Cast: L wrist volar splint Restrictions Weight Bearing Restrictions: Yes LUE Weight Bearing: Non weight bearing Other Position/Activity Restrictions: R untunneled femoral cath no flexion past 90 degrees      Mobility Bed Mobility Overal bed mobility: Needs Assistance Bed Mobility: Rolling;Supine to Sit Rolling: Mod assist   Supine to sit: HOB elevated     General bed mobility comments: attempting, but pt yelping in pain and resistant to movement    Transfers                 General transfer comment: DNT    Balance                                           ADL either performed or assessed with clinical judgement   ADL Overall ADL's : Needs assistance/impaired Eating/Feeding: Minimal assistance;Bed level Eating/Feeding Details (indicate cue type and reason): requiring assist; reminded to use RUE Grooming: Moderate assistance;Bed level   Upper Body Bathing: Moderate assistance;Bed level   Lower Body Bathing: Maximal assistance;Bed level   Upper Body Dressing : Moderate assistance;Bed level   Lower Body Dressing: Maximal assistance;Bed level   Toilet Transfer: Total assistance Toilet Transfer Details (indicate cue type and reason): Pt declining with yelps each time OTR attempting to assist pt to [place] and Hygiene: Total assistance Toileting - Clothing Manipulation Details (indicate cue type and reason): Pt with BM in bed and unaware of how to use call bell  to ask for assistance. Pt's RN aware of situation and checks on pt often. RN reported multiple BMs recently.     Functional mobility during ADLs: Moderate assistance;Total assistance (modA for bed mobility; totalA assuming for OOB) General ADL Comments: Pt limited by decreased strength, decreased ability to care for self, decreased activity tolerance and decreased cognition to follow commands. Pt's HR increasing to 135 BPM with exertion, O2  remained >90% on RA.     Vision Baseline Vision/History: No visual deficits Patient Visual Report: No change from baseline Vision Assessment?: No apparent visual deficits     Perception     Praxis      Pertinent Vitals/Pain Pain Assessment: 0-10 Pain Score: 8  Pain Location: LUE with initial movement of elbow but then denied pain in left wrist once sitting EOB with supported wrist Pain Descriptors / Indicators: Moaning;Grimacing Pain Intervention(s): Monitored during session;Premedicated before session;Repositioned     Hand Dominance Right   Extremity/Trunk Assessment Upper Extremity Assessment Upper Extremity Assessment: Generalized weakness LUE Deficits / Details: ROM at shoulder ~ 45* limited due to pain; FA, wrist and hand splinted   Lower Extremity Assessment Lower Extremity Assessment: Generalized weakness   Cervical / Trunk Assessment Cervical / Trunk Assessment: Kyphotic   Communication Communication Communication: Expressive difficulties   Cognition   Behavior During Therapy: Flat affect Overall Cognitive Status: Impaired/Different from baseline Area of Impairment: Orientation;Following commands;Safety/judgement;Awareness;Problem solving;Memory;Attention                 Orientation Level: Disoriented to;Place;Time;Situation Current Attention Level: Sustained Memory: Decreased short-term memory Following Commands: Follows one step commands inconsistently;Follows one step commands with increased time Safety/Judgement: Decreased awareness of deficits;Decreased awareness of safety Awareness: Intellectual Problem Solving: Slow processing;Decreased initiation;Difficulty sequencing;Requires verbal cues;Requires tactile cues General Comments: Pt limited by decreased ability to orient herself with date/location and situation. Pt having difficulty following all commands. Pt unaware of broken wrist and need to move other parts of her body.   General Comments        Exercises     Shoulder Instructions      Home Living Family/patient expects to be discharged to:: Private residence Living Arrangements: Children Available Help at Discharge: Family;Available 24 hours/day Type of Home: Apartment Home Access: Level entry     Home Layout: One level     Bathroom Shower/Tub: Occupational psychologist: Standard Bathroom Accessibility: Yes   Home Equipment: None   Additional Comments: noted to live with daughter and uncertain if home setup accurate as pt is poor historian      Prior Functioning/Environment Level of Independence: Independent  Gait / Transfers Assistance Needed: independent without AD ADL's / Homemaking Assistance Needed: able to bathe and dress, daughter does cooking and cleaning iADLs; pt stating that sometimes she needs assist for feeding.            OT Problem List: Decreased strength;Decreased activity tolerance;Impaired balance (sitting and/or standing);Decreased safety awareness;Pain;Cardiopulmonary status limiting activity;Decreased cognition;Decreased knowledge of precautions;Increased edema      OT Treatment/Interventions: Self-care/ADL training;Therapeutic exercise;Energy conservation;DME and/or AE instruction;Therapeutic activities;Cognitive remediation/compensation;Patient/family education;Balance training    OT Goals(Current goals can be found in the care plan section) Acute Rehab OT Goals Patient Stated Goal: none stated OT Goal Formulation: Patient unable to participate in goal setting Time For Goal Achievement: 11/26/20 Potential to Achieve Goals: Poor ADL Goals Pt Will Perform Grooming: with set-up;sitting Pt Will Perform Lower Body Dressing: with min guard assist;sit to/from stand Pt Will Transfer to Toilet:  with min assist;ambulating;bedside commode Pt Will Perform Toileting - Clothing Manipulation and hygiene: with min assist;sit to/from stand Pt/caregiver will Perform Home Exercise  Program: Increased strength;Right Upper extremity;With Supervision Additional ADL Goal #1: Pt will participate in higher level cognitive tasks to increase awareness and to continue to assess deficits.  OT Frequency: Min 2X/week   Barriers to D/C: Decreased caregiver support  increased physical assist required       Co-evaluation              AM-PAC OT "6 Clicks" Daily Activity     Outcome Measure Help from another person eating meals?: A Little Help from another person taking care of personal grooming?: A Lot Help from another person toileting, which includes using toliet, bedpan, or urinal?: Total Help from another person bathing (including washing, rinsing, drying)?: A Lot Help from another person to put on and taking off regular upper body clothing?: A Lot Help from another person to put on and taking off regular lower body clothing?: A Lot 6 Click Score: 12   End of Session Nurse Communication: Mobility status  Activity Tolerance: Patient limited by pain;Patient limited by fatigue Patient left: in bed;with call bell/phone within reach;with bed alarm set  OT Visit Diagnosis: Muscle weakness (generalized) (M62.81);Pain                Time: 1136-1229 OT Time Calculation (min): 53 min Charges:  OT General Charges $OT Visit: 1 Visit OT Evaluation $OT Eval Moderate Complexity: 1 Mod OT Treatments $Self Care/Home Management : 23-37 mins $Therapeutic Activity: 8-22 mins  Jefferey Pica, OTR/L Acute Rehabilitation Services Pager: [phone] Office: [phone]  Sherrel Shafer C 11/12/2020, 5:35 PM

## 2020-11-12 NOTE — Progress Notes (Addendum)
Physical Therapy Treatment Patient Details Name: [name] MRN: [id number] DOB: 05/03/1961 Today's Date: 11/12/2020    History of Present Illness 60 y.o. female with medical history significant of ESRD on HD TTS, CAD, hypertension, hyperlipidemia, paroxysmal A. 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 Found to be in A-fib with RVR, unvaccinated, COVID+, CT showing small L pleural effusion at the L lung base. Reports L wrist pain from daughter falling on her wrist prior to coming to ED found to have fx and  L UE wrist splint applied in ED. NWB will eventually require ORIF. Rapid response 11/09/20 transfer to ICU, placement of untunneled R femoral central venous catheter, D/c'd 11/11/20    PT Comments    Pt with flat affect and initially apprehensive and guarded about moving at all particularly with LUE. With encouragement, reassurance and assist pt able to get to EOB and once EOB pt calm, no pain and reporting willingness to get to chair and then attempt limited gait in room. Pt with impaired cognition, balance, function and strength all limiting mobility. Pt agreeable to current need for SNF as she states family cannot care for her in current state. Will continue to follow and educated RN for mobility performed and pt cognition.     Follow Up Recommendations  SNF;Supervision/Assistance - 24 hour     Equipment Recommendations  3in1 (PT)    Recommendations for Other Services       Precautions / Restrictions Precautions Precautions: Fall Required Braces or Orthoses: Splint/Cast Splint/Cast: L wrist volar splint Restrictions Weight Bearing Restrictions: Yes LUE Weight Bearing: Non weight bearing Other Position/Activity Restrictions: R untunneled femoral cath no flexion past 90 degrees     Mobility  Bed Mobility Overal bed mobility: Needs Assistance Bed Mobility: Supine to Sit     Supine to sit: Mod assist;HOB elevated     General bed mobility comments: HOB 35 degrees with increased time, encouragement and reassurance to pivot toward left with pt holding left wrist with right hand. Pt able to sit EOB without UE support  Transfers Overall transfer level: Needs assistance   Transfers: Sit to/from Stand Sit to Stand: Min assist         General transfer comment: min assist to rise from bed x 3 trials, pivot to chair, stand from chair and then walk. Cues for safety with pt holding hands together and allowing therapist to hook her RUE to stand  Ambulation/Gait Ambulation/Gait assistance: Min assist Gait Distance (Feet): 15 Feet Assistive device: 1 person hand held assist Gait Pattern/deviations: Trunk flexed;Decreased step length - right   Gait velocity interpretation: <1.8 ft/sec, indicate of risk for recurrent falls General Gait Details: pt with flexed trunk, UE support throughout with pt able to walk limited circle in room with assist and assist to control IV pole   Stairs             Wheelchair Mobility    Modified Rankin (Stroke Patients Only)       Balance Overall balance assessment: Needs assistance;History of Falls   Sitting balance-Leahy Scale: Fair     Standing balance support: Single extremity supported Standing balance-Leahy Scale: Poor                              Cognition Arousal/Alertness: Awake/alert  Behavior During Therapy: Flat affect Overall Cognitive Status: Impaired/Different from baseline Area of Impairment: Orientation;Following commands;Safety/judgement;Awareness;Problem solving;Memory;Attention                 Orientation Level: Disoriented to;Place;Time;Situation Current Attention Level: Sustained Memory: Decreased short-term memory Following Commands: Follows one step commands  inconsistently;Follows one step commands with increased time Safety/Judgement: Decreased awareness of deficits;Decreased awareness of safety Awareness: Intellectual Problem Solving: Slow processing;Decreased initiation;Difficulty sequencing;Requires verbal cues;Requires tactile cues General Comments: pt oriented to self and "hospital" not to the name. Pt unable to state how many grandchildren she has, what her daughter's ages are, or how she broke her wrist. Sitting in chair asked to move her right hand and she said "I can't" when asked why she said "because my foot is changing color" (sock on pt foot removed and skin normal).      Exercises General Exercises - Lower Extremity Long Arc Quad: AROM;Both;Seated;10 reps Hip Flexion/Marching: AROM;Both;10 reps;Seated    General Comments        Pertinent Vitals/Pain Pain Assessment: Faces Faces Pain Scale: Hurts even more Pain Location: LUE with initial movement of elbow but then denied pain in left wrist once sitting EOB with supported wrist Pain Descriptors / Indicators: Moaning;Grimacing Pain Intervention(s): Limited activity within patient's tolerance;Repositioned;Monitored during session    Home Living Family/patient expects to be discharged to:: Private residence Living Arrangements: Children Available Help at Discharge: Family;Available 24 hours/day Type of Home: Apartment Home Access: Level entry   Home Layout: One level Home Equipment: None Additional Comments: noted to live with daughter and uncertain if home setup accurate as pt is poor historian    Prior Function Level of Independence: Independent  Gait / Transfers Assistance Needed: independent without AD ADL's / Homemaking Assistance Needed: able to bathe and dress, daughter does cooking and cleaning iADLs Comments: poor historian   PT Goals (current goals can now be found in the care plan section) Acute Rehab PT Goals Patient Stated Goal: none stated Progress towards  PT goals: Progressing toward goals    Frequency    Min 2X/week      PT Plan Current plan remains appropriate    Co-evaluation              AM-PAC PT "6 Clicks" Mobility   Outcome Measure  Help needed turning from your back to your side while in a flat bed without using bedrails?: A Lot Help needed moving from lying on your back to sitting on the side of a flat bed without using bedrails?: A Lot Help needed moving to and from a bed to a chair (including a wheelchair)?: A Little Help needed standing up from a chair using your arms (e.g., wheelchair or bedside chair)?: A Little Help needed to walk in hospital room?: A Little Help needed climbing 3-5 steps with a railing? : A Lot 6 Click Score: 15    End of Session   Activity Tolerance: Patient tolerated treatment well Patient left: in chair;with call bell/phone within reach;with chair alarm set Nurse Communication: Mobility status PT Visit Diagnosis: Muscle weakness (generalized) (M62.81);Other abnormalities of gait and mobility (R26.89);Pain;Difficulty in walking, not elsewhere classified (R26.2)     Time: 1245-1310 PT Time Calculation (min) (ACUTE ONLY): 25 min  Charges:  $Gait Training: 8-22 mins $Therapeutic Activity: 8-22 mins                     [place], PT Acute Rehabilitation Services Pager: [phone] Office: [phone]  Anhar Mcdermott B Dagan Heinz 11/12/2020, 1:34 PM

## 2020-11-12 NOTE — Progress Notes (Signed)
Phlebotomy/lab aware of need for reattempts at collection of AM labs

## 2020-11-12 NOTE — Progress Notes (Signed)
[facility] Progress Note  Name: [name] MRN: [id number] DOB: [birth year]   Subjective:  She has been in ICU on room air.  Feels ok this morning, no c/o's.      Intake/Output Summary (Last 24 hours) at 11/12/2020 1436 Last data filed at 11/12/2020 0900 Gross per 24 hour  Intake 990.82 ml  Output 900 ml  Net 90.82 ml    Vitals:  Vitals:   11/12/20 0700 11/12/20 0800 11/12/20 0900 11/12/20 0930  BP: 112/68 138/65 128/77 111/89  Pulse: 66 89 69 77  Resp: 15 (!) 24 15 (!) 25  Temp: 97.7 F (36.5 C)     TempSrc: Axillary     SpO2: 95% 97% 100% 94%  Weight:         Physical Exam:    alert, nad , chron ill appearing, nasal O2  no jvd  Chest cta bilat  Cor reg no RG  Abd soft ntnd no ascites   Ext no LE edema   Alert, NF, ox3    R AVF +bruit  Medications reviewed   Labs:  BMP Latest Ref Rng & Units 11/12/2020 11/11/2020 11/11/2020  Glucose 70 - 99 mg/dL 108(H) 129(H) 86  BUN 6 - 20 mg/dL 53(H) 41(H) 98(H)  Creatinine 0.44 - 1.00 mg/dL 7.47(H) 6.48(H) 11.75(H)  Sodium 135 - 145 mmol/L 135 132(L) 133(L)  Potassium 3.5 - 5.1 mmol/L 3.6 3.3(L) 4.2  Chloride 98 - 111 mmol/L 95(L) 94(L) 91(L)  CO2 22 - 32 mmol/L 23 21(L) 20(L)  Calcium 8.9 - 10.3 mg/dL 9.2 8.6(L) 9.2    OP HD: TTS [facility]   3h 65mn  38kg  2/2 bath  TDC  Hep none  - calc 120 ug tiw  - mircera 75 q2 last 08/23/20  - velphoro 2 ac tid   - last HD 3hrs on 1/18, mostly compliant.  - missed VVS post OP f/u - sp plication on [id number]  CXR 1/21 - FINDINGS: Central catheter tip is at the cavoatrial junction. There is a intraperitoneal shunt catheter extending along the medial right hemithorax, stable. There is ill-defined airspace opacity in the left lower lobe. Lungs elsewhere clear. Heart is mildly enlarged with pulmonary venous hypertension. There is aortic atherosclerosis. No adenopathy. There is a loop recorder on the left. No bone lesions.  Assessment/Plan:   Acute anemia - Improved with  PRBC's. Exacerbating the anemia of CKD.  aranesp 100 mcg on 1/22  ESRD - HD TTS.  Had HD here yest on Sunday. Plan next HD tomorrow , Tuesday.   AMS - appreciate efforts of primary team.  Caution with sedating meds.  Would avoid morphine in ESRD patients  Hypotension - improved, BP's low 100's- 120's, no BP lowering meds here  A fib - per primary team   Covid 19 infection - per primary team ;on remdesivir   Metabolic bone disease - binders when taking PO. Continue calcitriol    RSol Blazing MD 11/12/2020 2:36 PM

## 2020-11-13 LAB — COMPREHENSIVE METABOLIC PANEL
ALT: 19 U/L (ref 0–44)
AST: 25 U/L (ref 15–41)
Albumin: 2.1 g/dL — ABNORMAL LOW (ref 3.5–5.0)
Alkaline Phosphatase: 69 U/L (ref 38–126)
Anion gap: 21 — ABNORMAL HIGH (ref 5–15)
BUN: 74 mg/dL — ABNORMAL HIGH (ref 6–20)
CO2: 18 mmol/L — ABNORMAL LOW (ref 22–32)
Calcium: 8.9 mg/dL (ref 8.9–10.3)
Chloride: 94 mmol/L — ABNORMAL LOW (ref 98–111)
Creatinine, Ser: 9.4 mg/dL — ABNORMAL HIGH (ref 0.44–1.00)
GFR, Estimated: 4 mL/min — ABNORMAL LOW (ref >60)
Glucose, Bld: 83 mg/dL (ref 70–99)
Potassium: 4.2 mmol/L (ref 3.5–5.1)
Sodium: 133 mmol/L — ABNORMAL LOW (ref 135–145)
Total Bilirubin: 0.8 mg/dL (ref 0.3–1.2)
Total Protein: 4.9 g/dL — ABNORMAL LOW (ref 6.5–8.1)

## 2020-11-13 LAB — CBC WITH DIFFERENTIAL/PLATELET
Abs Immature Granulocytes: 0.07 10*3/uL (ref 0.00–0.07)
Basophils Absolute: 0 10*3/uL (ref 0.0–0.1)
Basophils Relative: 0 %
Eosinophils Absolute: 0 10*3/uL (ref 0.0–0.5)
Eosinophils Relative: 0 %
HCT: 32.3 % — ABNORMAL LOW (ref 36.0–46.0)
Hemoglobin: 11.5 g/dL — ABNORMAL LOW (ref 12.0–15.0)
Immature Granulocytes: 1 %
Lymphocytes Relative: 11 %
Lymphs Abs: 0.9 10*3/uL (ref 0.7–4.0)
MCH: 29.3 pg (ref 26.0–34.0)
MCHC: 35.6 g/dL (ref 30.0–36.0)
MCV: 82.2 fL (ref 80.0–100.0)
Monocytes Absolute: 0.4 10*3/uL (ref 0.1–1.0)
Monocytes Relative: 5 %
Neutro Abs: 6.3 10*3/uL (ref 1.7–7.7)
Neutrophils Relative %: 83 %
Platelets: 355 10*3/uL (ref 150–400)
RBC: 3.93 MIL/uL (ref 3.87–5.11)
RDW: 18.1 % — ABNORMAL HIGH (ref 11.5–15.5)
WBC: 7.7 10*3/uL (ref 4.0–10.5)
nRBC: 0.4 % — ABNORMAL HIGH (ref 0.0–0.2)

## 2020-11-13 LAB — BRAIN NATRIURETIC PEPTIDE: B Natriuretic Peptide: >4500 pg/mL — ABNORMAL HIGH (ref 0.0–100.0)

## 2020-11-13 LAB — D-DIMER, QUANTITATIVE: D-Dimer, Quant: 2.47 ug/mL-FEU — ABNORMAL HIGH (ref 0.00–0.50)

## 2020-11-13 LAB — T3: T3, Total: 80 ng/dL (ref 71–180)

## 2020-11-13 LAB — C-REACTIVE PROTEIN: CRP: 12.5 mg/dL — ABNORMAL HIGH (ref <1.0)

## 2020-11-13 LAB — MAGNESIUM: Magnesium: 2.2 mg/dL (ref 1.7–2.4)

## 2020-11-13 LAB — LIPASE, BLOOD: Lipase: 23 U/L (ref 11–51)

## 2020-11-13 MED ORDER — HEPARIN SODIUM (PORCINE) 1000 UNIT/ML IJ SOLN
INTRAMUSCULAR | Status: AC
  Filled 2020-11-13: qty 4

## 2020-11-13 MED ORDER — METHIMAZOLE 10 MG PO TABS
10.0000 mg | ORAL_TABLET | Freq: Two times a day (BID) | ORAL | Status: DC
  Administered 2020-11-13 – 2020-11-27 (×28): 10 mg via ORAL
  Filled 2020-11-13 (×32): qty 1

## 2020-11-13 NOTE — Progress Notes (Signed)
[facility] Progress Note  Name: [name] MRN: [id number] DOB: 13-Jun-1961   Subjective:  Patient not examined today directly given COVID-19 + status, utilizing data taken from chart +/- discussions w/ providers and staff.      Intake/Output Summary (Last 24 hours) at 11/13/2020 1555 Last data filed at 11/13/2020 0932 Gross per 24 hour  Intake 336.67 ml  Output --  Net 336.67 ml    Vitals:  Vitals:   11/13/20 1420 11/13/20 1435 11/13/20 1450 11/13/20 1505  BP: 101/72 105/80 103/81 95/64  Pulse:      Resp: '16 16 16 19  '$ Temp:      TempSrc:      SpO2:      Weight:         Physical Exam:   Patient not examined today directly given COVID-19 + status, utilizing data taken from chart +/- discussions w/ providers and staff.    Medications reviewed   Labs:  BMP Latest Ref Rng & Units 11/13/2020 11/12/2020 11/11/2020  Glucose 70 - 99 mg/dL 83 108(H) 129(H)  BUN 6 - 20 mg/dL 74(H) 53(H) 41(H)  Creatinine 0.44 - 1.00 mg/dL 9.40(H) 7.47(H) 6.48(H)  Sodium 135 - 145 mmol/L 133(L) 135 132(L)  Potassium 3.5 - 5.1 mmol/L 4.2 3.6 3.3(L)  Chloride 98 - 111 mmol/L 94(L) 95(L) 94(L)  CO2 22 - 32 mmol/L 18(L) 23 21(L)  Calcium 8.9 - 10.3 mg/dL 8.9 9.2 8.6(L)    OP HD: TTS [facility]   3h 35mn  38kg  2/2 bath  TDC  Hep none  - calc 120 ug tiw  - mircera 75 q2 last 08/23/20  - velphoro 2 ac tid   - last HD 3hrs on 1/18, mostly compliant.  - missed VVS post OP f/u - sp plication on [id number]  CXR 1/21 - FINDINGS: Central catheter tip is at the cavoatrial junction. There is a intraperitoneal shunt catheter extending along the medial right hemithorax, stable. There is ill-defined airspace opacity in the left lower lobe. Lungs elsewhere clear. Heart is mildly enlarged with pulmonary venous hypertension. There is aortic atherosclerosis. No adenopathy. There is a loop recorder on the left. No bone lesions.  Assessment/Plan:   Acute anemia - Improved with PRBC's. Exacerbating the  anemia of CKD.  aranesp 100 mcg on 1/22  ESRD - HD TTS.  Had HD here on Sunday. Plan next HD today.   AMS - appreciate efforts of primary team.  Caution with sedating meds.  Would avoid morphine in ESRD patients  Hypotension - improved, BP's low 100's- 120's, no BP lowering meds here  A fib - per primary team   Covid 19 infection - per primary team ;on remdesivir   Metabolic bone disease - binders when taking PO. Continue calcitriol    RSol Blazing MD 11/13/2020 3:55 PM

## 2020-11-14 ENCOUNTER — Inpatient Hospital Stay (HOSPITAL_COMMUNITY): Payer: 59

## 2020-11-14 DIAGNOSIS — R7989 Other specified abnormal findings of blood chemistry: Secondary | ICD-10-CM | POA: Diagnosis not present

## 2020-11-14 DIAGNOSIS — I4891 Unspecified atrial fibrillation: Secondary | ICD-10-CM | POA: Diagnosis not present

## 2020-11-14 DIAGNOSIS — U071 COVID-19: Secondary | ICD-10-CM | POA: Diagnosis not present

## 2020-11-14 NOTE — Progress Notes (Signed)
Lower extremity venous bilateral study completed.  Preliminary results relayed to Posey Pronto, MD and to RN.   See CV Proc for preliminary results report.   Darlin Coco, RDMS

## 2020-11-14 NOTE — Progress Notes (Signed)
[facility] Progress Note  Patient: [name]    [id number]  DOA: 11/08/2020     Date of Service: the patient was seen and examined on 11/14/2020  Brief hospital course: Past medical history of ESRD on HD TTS, CAD, HTN, HLD, paroxysmal A. fib not on any anticoagulation secondary to [place], seizure disorder, CVA in 2015.  Presents with complaints of nausea vomiting and epigastric pain.  Found to have COVID-19 pneumonia incidentally along with A. fib with RVR and also acute on chronic anemia.  Currently plan is continue monitoring for stability of H&H and progression of DVT as well as follow-up on postop course after surgery.  Assessment and Plan: 1. Acute COVID-19 Viral Pneumonia CXR: hazy bilateral peripheral opacities Oxygen requirement: On room air CRP: 23.2-12.5.  Patient refused lab work on 1/26 Remdesivir: Completed on 1/25 Steroids: Not indicated given lack of hypoxia Baricitinib/Actemra: Not indicated given concern with bacterial infection Antibiotics: On IV cefepime DVT Prophylaxis: heparin injection 5,000 Units Start: 11/12/20 1400 Place and maintain sequential compression device Start: 11/09/20 1030  Prone positioning and incentive spirometer use recommended.  Overall plan: From Covid point of view the patient remained stable Isolation duration: First positive test date 11/08/2020, can come off of the isolation on end of 10 days.  Day 0 is 11/08/2020.  Can be out of isolation on 11/19/2020  The treatment plan and use of medications and known side effects were discussed with patient/family. It was clearly explained that complete risks and long-term side effects are unknown. Patient/family agree with the treatment plan.    2.  Acute on chronic anemia. 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 Epigastric pain Constipation-resolved On presentation hemoglobin dropped down significantly from baseline of 11-6.8. No evidence of acute bleeding or  hemolysis.  No schistocytes seen on the smear. Lipase mildly elevated. CT abdomen shows no acute abnormality. X-ray abdomen also unremarkable. GI was consulted who recommended continued conservative measures, no intervention and currently signed off. Lipase elevation likely secondary to severe constipation currently resolved.. Not a candidate for any anticoagulation given her ongoing anemia. Continue PPI. Transfuse for hemoglobin less than 7 or hemodynamic instability.  3.  Paroxysmal A. fib with RVR Not on any anticoagulation given history of ICH. Cardiology was consulted Currently signed off. Recommend to continue amiodarone drip until the patient is through her surgery. No further work-up recommended.  4.  ESRD on HD TTS Nephrology consulted. Appreciate assistance.  HD frequency per nephrology. Receiving Aranesp, calcitriol.  5.  Right CFV DVT Ultrasound lower extremity positive for DVT, nonocclusive, age indeterminant on right leg. Patient not a candidate for anticoagulation given her ongoing anemia as well as prior history of ICH. Discussed with daughter and recommended IVC filter. Discussed with IR recommend currently holding off on intervention Relatively inpatient and follow-up Doppler in 48 hours. Patient remains at risk for progression of the DVT given her immobility secondary to her critical condition.  6.  Left wrist fracture Plan for ORIF on Thursday. N.p.o. after midnight.  7.  Underweight. Likely severe protein calorie malnutrition. Placing the patient at risk for poor outcome Will consult dietary Body mass index is 18.12 kg/m.   8.  Acute metabolic encephalopathy. Likely multifactorial. No single etiology. Currently no stroke, no further intervention close monitoring.  Diet: Regular diet DVT Prophylaxis:   heparin injection 5,000 Units Start: 11/12/20 1400 Place and maintain sequential compression device Start: 11/09/20 1030    Advance goals of care  discussion: Full  code  Family Communication: no family was present at bedside, at the time of interview.  Discussed with daughter on the phone. The pt provided permission to discuss medical plan with the family. Opportunity was given to ask question and all questions were answered satisfactorily.   Disposition:  Status is: Inpatient  Remains inpatient appropriate because:IV treatments appropriate due to intensity of illness or inability to take PO and Inpatient level of care appropriate due to severity of illness   Dispo: The patient is from: Home              Anticipated d/c is to: SNF              Anticipated d/c date is: > 3 days              Patient currently is not medically stable to d/c.   Difficult to place patient No        Subjective: No nausea no vomiting.  No fever no chills.  No chest pain.  No abdominal pain.  No diarrhea.  No blood in the stool.  Physical Exam:  General: Appear in mild distress, no Rash; Oral Mucosa Clear, moist. no Abnormal Neck Mass Or lumps, Conjunctiva normal  Cardiovascular: S1 and S2 Present, no Murmur, Respiratory: Normal respiratory effort, Bilateral Air entry present and faint crackles, no wheezes Abdomen: Bowel Sound present, Soft and no tenderness Extremities: trace Pedal edema Neurology: alert and oriented to time, place, and person affect appropriate. no new focal deficit Gait not checked due to patient safety concerns    Vitals:   11/14/20 1300 11/14/20 1400 11/14/20 1500 11/14/20 1600  BP: 126/63 (!) 119/50 120/86 121/70  Pulse: 78 70 69 70  Resp: '15 18 17 19  '$ Temp:      TempSrc:      SpO2: 100% 96% 98% 100%  Weight:        Intake/Output Summary (Last 24 hours) at 11/14/2020 1859 Last data filed at 11/14/2020 1600 Gross per 24 hour  Intake 886.7 ml  Output -  Net 886.7 ml   Filed Weights   11/11/20 1430 11/11/20 1721 11/12/20 0515  Weight: 44.2 kg 43.4 kg 43.5 kg    Data Reviewed: I have personally reviewed and  interpreted daily labs, tele strips, imaging. I reviewed all nursing notes, pharmacy notes, vitals, pertinent old records I have discussed plan of care as described above with RN and patient/family.  CBC: Recent Labs  Lab 11/09/20 0806 11/09/20 1008 11/09/20 1340 11/09/20 2241 11/10/20 0431 11/11/20 0530 11/11/20 2110 11/13/20 1027  WBC 5.9  --   --   --  6.1 6.0 6.5 7.7  NEUTROABS  --   --   --   --  5.0 4.3 5.4 6.3  HGB 6.8*   < >  --  10.7* 9.7* 11.5* 13.0 11.5*  HCT 20.6*   < >  --  31.8* 29.1* 32.8* 38.5 32.3*  MCV 88.0  --   --   --  84.1 83.0 82.3 82.2  PLT 255  --  249  --  239 328 387 355   < > = values in this interval not displayed.   Basic Metabolic Panel: Recent Labs  Lab 11/10/20 0431 11/11/20 0530 11/11/20 2110 11/12/20 0742 11/13/20 1027  NA 133* 133* 132* 135 133*  K 4.3 4.2 3.3* 3.6 4.2  CL 91* 91* 94* 95* 94*  CO2 21* 20* 21* 23 18*  GLUCOSE 101* 86 129* 108* 83  BUN  80* 98* 41* 53* 74*  CREATININE 10.53* 11.75* 6.48* 7.47* 9.40*  CALCIUM 8.8* 9.2 8.6* 9.2 8.9  MG 2.4 2.3 2.0 2.1 2.2    Studies: VAS [place] LOWER EXTREMITY VENOUS (DVT)  Result Date: 11/14/2020  Lower Venous DVT Study Indications: D-dimer, Covid-19.  Comparison Study: No prior studies. Performing Technologist: Darlin Coco RDMS  Examination Guidelines: A complete evaluation includes B-mode imaging, spectral Doppler, color Doppler, and power Doppler as needed of all accessible portions of each vessel. Bilateral testing is considered an integral part of a complete examination. Limited examinations for reoccurring indications may be performed as noted. The reflux portion of the exam is performed with the patient in reverse Trendelenburg.  +---------+---------------+---------+-----------+----------+-----------------+ RIGHT    CompressibilityPhasicitySpontaneityPropertiesThrombus Aging    +---------+---------------+---------+-----------+----------+-----------------+ CFV      Partial         No       Yes                  Age Indeterminate +---------+---------------+---------+-----------+----------+-----------------+ SFJ      Full           No       Yes                                    +---------+---------------+---------+-----------+----------+-----------------+ FV Prox                 No       Yes                                    +---------+---------------+---------+-----------+----------+-----------------+ FV Mid                  No       Yes                                    +---------+---------------+---------+-----------+----------+-----------------+ FV Distal               No       Yes                                    +---------+---------------+---------+-----------+----------+-----------------+ PFV                     No       Yes                                    +---------+---------------+---------+-----------+----------+-----------------+ POP                     No       Yes                                    +---------+---------------+---------+-----------+----------+-----------------+ PTV                     No       Yes                                    +---------+---------------+---------+-----------+----------+-----------------+   +---------+---------------+---------+-----------+----------+--------------+  LEFT     CompressibilityPhasicitySpontaneityPropertiesThrombus Aging +---------+---------------+---------+-----------+----------+--------------+ CFV      Full           No       Yes                                 +---------+---------------+---------+-----------+----------+--------------+ SFJ      Full                                                        +---------+---------------+---------+-----------+----------+--------------+ FV Prox  Full                                                        +---------+---------------+---------+-----------+----------+--------------+ FV Mid   Full                                                         +---------+---------------+---------+-----------+----------+--------------+ FV DistalFull                                                        +---------+---------------+---------+-----------+----------+--------------+ PFV      Full           No       Yes                                 +---------+---------------+---------+-----------+----------+--------------+ POP      Full           No       Yes                                 +---------+---------------+---------+-----------+----------+--------------+ PTV      Full                                                        +---------+---------------+---------+-----------+----------+--------------+ PERO     Full                                                        +---------+---------------+---------+-----------+----------+--------------+     Summary: RIGHT: - Findings consistent with a partial, non-obstructing age indeterminate deep vein thrombosis involving the right common femoral vein. - No cystic structure found in the popliteal fossa.  LEFT: - There is no evidence of deep vein thrombosis in the lower extremity.  -  No cystic structure found in the popliteal fossa.  *See table(s) above for measurements and observations.    Preliminary     Scheduled Meds: . calcitRIOL  1 mcg Oral Q T,Th,Sa-HD  . chlorhexidine  60 mL Topical Once  . Chlorhexidine Gluconate Cloth  6 each Topical Q0600  . darbepoetin (ARANESP) injection - NON-DIALYSIS  100 mcg Subcutaneous Q Sat-1800  . feeding supplement  296 mL Oral Once  . heparin injection (subcutaneous)  5,000 Units Subcutaneous Q8H  . methimazole  10 mg Oral BID  . midodrine  5 mg Oral BID WC  . pantoprazole (PROTONIX) IV  40 mg Intravenous Q12H  . polyethylene glycol  17 g Oral BID  . povidone-iodine  2 application Topical Once   Continuous Infusions: . amiodarone 30 mg/hr (11/14/20 1646)  . ceFEPime (MAXIPIME) IV Stopped  (11/13/20 2200)  . levETIRAcetam Stopped (11/14/20 1147)   PRN Meds: [DISCONTINUED] acetaminophen **OR** acetaminophen, acetaminophen, fentaNYL (SUBLIMAZE) injection, prochlorperazine, sodium chloride flush  Time spent: 35 minutes  Author: Berle Mull, MD [facility] 11/14/2020 6:59 PM  To reach On-call, see care teams to locate the attending and reach out via [email or web address]. Between 7PM-7AM, please contact night-coverage If you still have difficulty reaching the attending provider, please page the [facility] (Director on Call) for [facility] on amion for assistance.

## 2020-11-14 NOTE — Progress Notes (Signed)
Patient refuses labs. Patient educated, still refused.

## 2020-11-14 NOTE — Progress Notes (Signed)
[facility] Progress Note  Name: [name] MRN: [id number] DOB: Oct 01, 1961   Subjective:  Seen in ICU, pt is lethargic but arouses and is pleasant, no c/o this am.     Intake/Output Summary (Last 24 hours) at 11/14/2020 1610 Last data filed at 11/14/2020 1300 Gross per 24 hour  Intake 516.69 ml  Output 325 ml  Net 191.69 ml    Vitals:  Vitals:   11/14/20 1000 11/14/20 1100 11/14/20 1200 11/14/20 1300  BP: 111/63 (!) 107/56 100/72 126/63  Pulse: 67 68 66 78  Resp: '15 17 15 15  '$ Temp:      TempSrc:      SpO2: 100% 100% 99% 100%  Weight:         Physical Exam:     alert, nad , chron ill appearing, nasal O2   Extremely tired, cachectic  no jvd  Chest cta bilat  Cor reg no RG  Abd soft ntnd no ascites   Ext no LE edema   Alert, NF, ox3    R AVF +bruit  Medications reviewed   Labs:  BMP Latest Ref Rng & Units 11/13/2020 11/12/2020 11/11/2020  Glucose 70 - 99 mg/dL 83 108(H) 129(H)  BUN 6 - 20 mg/dL 74(H) 53(H) 41(H)  Creatinine 0.44 - 1.00 mg/dL 9.40(H) 7.47(H) 6.48(H)  Sodium 135 - 145 mmol/L 133(L) 135 132(L)  Potassium 3.5 - 5.1 mmol/L 4.2 3.6 3.3(L)  Chloride 98 - 111 mmol/L 94(L) 95(L) 94(L)  CO2 22 - 32 mmol/L 18(L) 23 21(L)  Calcium 8.9 - 10.3 mg/dL 8.9 9.2 8.6(L)    OP HD: TTS [facility]   3h 68mn  38kg  2/2 bath  TDC  Hep none  - calc 120 ug tiw  - mircera 75 q2 last 08/23/20  - velphoro 2 ac tid   - last HD 3hrs on 1/18, mostly compliant.  - missed VVS post OP f/u - sp plication on [id number]  CXR 1/21 - FINDINGS: Central catheter tip is at the cavoatrial junction. There is a intraperitoneal shunt catheter extending along the medial right hemithorax, stable. There is ill-defined airspace opacity in the left lower lobe. Lungs elsewhere clear. Heart is mildly enlarged with pulmonary venous hypertension. There is aortic atherosclerosis. No adenopathy. There is a loop recorder on the left. No bone lesions.  Assessment/Plan:   Acute anemia -  Improved with PRBC's. Exacerbating the anemia of CKD.  aranesp 100 mcg on 1/22  ESRD - HD TTS.  Sp HD here Sunday and Tuesday. Next HD tomorrow.   AMS - appreciate efforts of primary team.   Hypotension - improved, BP's low 100's- 120's, no BP lowering meds here  Volume - no vol excess on exam. CXR no edema, LLL infiltrate. Wt's may be off, suggesting 5kg up.   A fib - per primary team   Covid 19 infection - per primary team ;on remdesivir   Metabolic bone disease - binders when taking PO. Continue calcitriol    FTT - in an adult  RSol Blazing MD 11/14/2020 4:10 PM

## 2020-11-14 NOTE — Progress Notes (Signed)
Interventional Radiology Brief Note:  [name] is a 60 year old female with history of ESRD on HD, CAD, HTN, embolic stroke [id number], paroxysmal a fib not on chronic anticoagulation due to history of cerebral aneurysms with subarachnoid hemorrhage in 2013 who presented to the ED with nausea, vomiting, abdominal pain.  She was found to be in a fib with RVR.  She complained of worsening abdominal pain associated with acute drop in hemoglobin. Imaging showed no acute bleeding.  GI was consulted and also found no evidence of bleeding/GI source. She did have a DIC panel which was stable, however D-dimer persistently elevated. Nephrology following and appears anemia is thus far attributed to chronic disease, end stage renal failure. LE dopplers were ordered for assessment of possible DVT.   Preliminary report shows partial, non-obstructing age indeterminate  deep vein thrombosis involving the right common femoral vein.   IR consulted for IVC filter placement in patient unable to initiate anticoagulation.   Case reviewed by Dr. Kathlene Cote.  Risk of PE with minimal, non-obstructing, age indeterminate clot is very low. Would consider repeat venous duplex before committing to IVC filter in relatively young patient.   No procedure planned in IR at this time.  Message sent to ordering provider.   Brynda Greathouse, MS RD PA-C 4:22 PM

## 2020-11-15 ENCOUNTER — Inpatient Hospital Stay (HOSPITAL_COMMUNITY): Payer: 59 | Admitting: Certified Registered Nurse Anesthetist

## 2020-11-15 ENCOUNTER — Encounter (HOSPITAL_COMMUNITY)
Admission: EM | Disposition: A | Discharge status: Home Health | Payer: Self-pay | Source: Home / Self Care | Attending: Internal Medicine

## 2020-11-15 ENCOUNTER — Encounter (HOSPITAL_COMMUNITY): Payer: Self-pay | Admitting: Internal Medicine

## 2020-11-15 ENCOUNTER — Inpatient Hospital Stay (HOSPITAL_COMMUNITY): Payer: 59

## 2020-11-15 DIAGNOSIS — I4891 Unspecified atrial fibrillation: Secondary | ICD-10-CM | POA: Diagnosis not present

## 2020-11-15 HISTORY — PX: OPEN REDUCTION INTERNAL FIXATION (ORIF) DISTAL RADIAL FRACTURE: SHX5989

## 2020-11-15 LAB — COMPREHENSIVE METABOLIC PANEL
ALT: 15 U/L (ref 0–44)
AST: 14 U/L — ABNORMAL LOW (ref 15–41)
Albumin: 2.2 g/dL — ABNORMAL LOW (ref 3.5–5.0)
Alkaline Phosphatase: 84 U/L (ref 38–126)
Anion gap: 21 — ABNORMAL HIGH (ref 5–15)
BUN: 57 mg/dL — ABNORMAL HIGH (ref 6–20)
CO2: 17 mmol/L — ABNORMAL LOW (ref 22–32)
Calcium: 9.9 mg/dL (ref 8.9–10.3)
Chloride: 96 mmol/L — ABNORMAL LOW (ref 98–111)
Creatinine, Ser: 7.87 mg/dL — ABNORMAL HIGH (ref 0.44–1.00)
GFR, Estimated: 5 mL/min — ABNORMAL LOW (ref >60)
Glucose, Bld: 114 mg/dL — ABNORMAL HIGH (ref 70–99)
Potassium: 4.5 mmol/L (ref 3.5–5.1)
Sodium: 134 mmol/L — ABNORMAL LOW (ref 135–145)
Total Bilirubin: 0.6 mg/dL (ref 0.3–1.2)
Total Protein: 5 g/dL — ABNORMAL LOW (ref 6.5–8.1)

## 2020-11-15 LAB — CBC WITH DIFFERENTIAL/PLATELET
Abs Immature Granulocytes: 0.09 10*3/uL — ABNORMAL HIGH (ref 0.00–0.07)
Basophils Absolute: 0 10*3/uL (ref 0.0–0.1)
Basophils Relative: 0 %
Eosinophils Absolute: 0 10*3/uL (ref 0.0–0.5)
Eosinophils Relative: 0 %
HCT: 32.6 % — ABNORMAL LOW (ref 36.0–46.0)
Hemoglobin: 11.3 g/dL — ABNORMAL LOW (ref 12.0–15.0)
Immature Granulocytes: 1 %
Lymphocytes Relative: 11 %
Lymphs Abs: 1 10*3/uL (ref 0.7–4.0)
MCH: 27.9 pg (ref 26.0–34.0)
MCHC: 34.7 g/dL (ref 30.0–36.0)
MCV: 80.5 fL (ref 80.0–100.0)
Monocytes Absolute: 0.6 10*3/uL (ref 0.1–1.0)
Monocytes Relative: 7 %
Neutro Abs: 7 10*3/uL (ref 1.7–7.7)
Neutrophils Relative %: 81 %
Platelets: 326 10*3/uL (ref 150–400)
RBC: 4.05 MIL/uL (ref 3.87–5.11)
RDW: 18.1 % — ABNORMAL HIGH (ref 11.5–15.5)
WBC: 8.6 10*3/uL (ref 4.0–10.5)
nRBC: 0.5 % — ABNORMAL HIGH (ref 0.0–0.2)

## 2020-11-15 LAB — D-DIMER, QUANTITATIVE: D-Dimer, Quant: 2.78 ug/mL-FEU — ABNORMAL HIGH (ref 0.00–0.50)

## 2020-11-15 LAB — GLUCOSE, CAPILLARY
Glucose-Capillary: 113 mg/dL — ABNORMAL HIGH (ref 70–99)
Glucose-Capillary: 130 mg/dL — ABNORMAL HIGH (ref 70–99)
Glucose-Capillary: 147 mg/dL — ABNORMAL HIGH (ref 70–99)

## 2020-11-15 LAB — MAGNESIUM: Magnesium: 2.1 mg/dL (ref 1.7–2.4)

## 2020-11-15 LAB — C-REACTIVE PROTEIN: CRP: 13.5 mg/dL — ABNORMAL HIGH (ref <1.0)

## 2020-11-15 SURGERY — OPEN REDUCTION INTERNAL FIXATION (ORIF) DISTAL RADIUS FRACTURE
Anesthesia: Monitor Anesthesia Care | Laterality: Left

## 2020-11-15 MED ORDER — CALCIUM CHLORIDE 10 % IV SOLN
INTRAVENOUS | Status: AC
  Filled 2020-11-15: qty 10

## 2020-11-15 MED ORDER — ALBUMIN HUMAN 5 % IV SOLN: INTRAVENOUS | Status: DC | PRN

## 2020-11-15 MED ORDER — AMIODARONE HCL 200 MG PO TABS
400.0000 mg | ORAL_TABLET | Freq: Two times a day (BID) | ORAL | Status: DC
  Administered 2020-11-15 – 2020-11-20 (×10): 400 mg via ORAL
  Filled 2020-11-15 (×10): qty 2

## 2020-11-15 MED ORDER — 0.9 % SODIUM CHLORIDE (POUR BTL) OPTIME
TOPICAL | Status: DC | PRN
  Administered 2020-11-15: 1000 mL

## 2020-11-15 MED ORDER — ONDANSETRON HCL 4 MG/2ML IJ SOLN
INTRAMUSCULAR | Status: DC | PRN
  Administered 2020-11-15: 4 mg via INTRAVENOUS

## 2020-11-15 MED ORDER — CEFAZOLIN SODIUM-DEXTROSE 1-4 GM/50ML-% IV SOLN
INTRAVENOUS | Status: AC
  Filled 2020-11-15: qty 50

## 2020-11-15 MED ORDER — MIDAZOLAM HCL 5 MG/5ML IJ SOLN
INTRAMUSCULAR | Status: DC | PRN
  Administered 2020-11-15: 1 mg via INTRAVENOUS

## 2020-11-15 MED ORDER — FENTANYL CITRATE (PF) 250 MCG/5ML IJ SOLN
INTRAMUSCULAR | Status: DC | PRN
  Administered 2020-11-15: 50 ug via INTRAVENOUS

## 2020-11-15 MED ORDER — MIDAZOLAM HCL 2 MG/2ML IJ SOLN
INTRAMUSCULAR | Status: AC
  Filled 2020-11-15: qty 2

## 2020-11-15 MED ORDER — FENTANYL CITRATE (PF) 250 MCG/5ML IJ SOLN
INTRAMUSCULAR | Status: AC
  Filled 2020-11-15: qty 5

## 2020-11-15 MED ORDER — EPHEDRINE SULFATE-NACL 50-0.9 MG/10ML-% IV SOSY
PREFILLED_SYRINGE | INTRAVENOUS | Status: DC | PRN
  Administered 2020-11-15 (×4): 10 mg via INTRAVENOUS

## 2020-11-15 MED ORDER — PROPOFOL 10 MG/ML IV BOLUS
INTRAVENOUS | Status: AC
  Filled 2020-11-15: qty 20

## 2020-11-15 MED ORDER — LEVETIRACETAM 500 MG PO TABS
500.0000 mg | ORAL_TABLET | Freq: Two times a day (BID) | ORAL | Status: DC
  Administered 2020-11-15 – 2020-11-27 (×24): 500 mg via ORAL
  Filled 2020-11-15 (×25): qty 1

## 2020-11-15 MED ORDER — ROPIVACAINE HCL 5 MG/ML IJ SOLN
INTRAMUSCULAR | Status: DC | PRN
  Administered 2020-11-15: 25 mL via PERINEURAL

## 2020-11-15 MED ORDER — PHENYLEPHRINE HCL-NACL 10-0.9 MG/250ML-% IV SOLN
INTRAVENOUS | Status: DC | PRN
  Administered 2020-11-15: 50 ug/min via INTRAVENOUS

## 2020-11-15 MED ORDER — NOREPINEPHRINE BITARTRATE 1 MG/ML IV SOLN
INTRAVENOUS | Status: DC | PRN
  Administered 2020-11-15: 1 mL via INTRAVENOUS

## 2020-11-15 MED ORDER — KETAMINE HCL 50 MG/5ML IJ SOSY
PREFILLED_SYRINGE | INTRAMUSCULAR | Status: AC
  Filled 2020-11-15: qty 5

## 2020-11-15 MED ORDER — SODIUM CHLORIDE 0.9 % IV SOLN: INTRAVENOUS | Status: DC | PRN

## 2020-11-15 MED ORDER — CEFAZOLIN SODIUM-DEXTROSE 2-3 GM-%(50ML) IV SOLR
INTRAVENOUS | Status: DC | PRN
  Administered 2020-11-15: 2 g via INTRAVENOUS

## 2020-11-15 MED ORDER — PHENYLEPHRINE 40 MCG/ML (10ML) SYRINGE FOR IV PUSH (FOR BLOOD PRESSURE SUPPORT)
PREFILLED_SYRINGE | INTRAVENOUS | Status: DC | PRN
  Administered 2020-11-15: 120 ug via INTRAVENOUS
  Administered 2020-11-15 (×2): 80 ug via INTRAVENOUS
  Administered 2020-11-15: 120 ug via INTRAVENOUS

## 2020-11-15 MED ORDER — CALCIUM CHLORIDE 10 % IV SOLN
INTRAVENOUS | Status: DC | PRN
  Administered 2020-11-15 (×4): 200 mg via INTRAVENOUS

## 2020-11-15 MED ORDER — NOREPINEPHRINE 4 MG/250ML-% IV SOLN
INTRAVENOUS | Status: AC
  Filled 2020-11-15: qty 250

## 2020-11-15 MED ORDER — KETAMINE HCL 10 MG/ML IJ SOLN
INTRAMUSCULAR | Status: DC | PRN
  Administered 2020-11-15: 10 mg via INTRAVENOUS

## 2020-11-15 MED ORDER — BUPIVACAINE HCL (PF) 0.25 % IJ SOLN
INTRAMUSCULAR | Status: AC
  Filled 2020-11-15: qty 10

## 2020-11-15 MED ORDER — PHENYLEPHRINE HCL-NACL 10-0.9 MG/250ML-% IV SOLN
INTRAVENOUS | Status: AC
  Filled 2020-11-15: qty 250

## 2020-11-15 SURGICAL SUPPLY — 58 items
ALCOHOL 70% 16 OZ (MISCELLANEOUS) IMPLANT
APL PRP STRL LF DISP 70% ISPRP (MISCELLANEOUS) ×1
BIT DRILL 2.0 LNG QUCK RELEASE (BIT) ×1 IMPLANT
BIT DRILL QC 2.8X5 (BIT) ×2 IMPLANT
BLADE CLIPPER SURG (BLADE) IMPLANT
BNDG CMPR 9X4 STRL LF SNTH (GAUZE/BANDAGES/DRESSINGS) ×1
BNDG ELASTIC 3X5.8 VLCR STR LF (GAUZE/BANDAGES/DRESSINGS) ×6 IMPLANT
BNDG ELASTIC 4X5.8 VLCR STR LF (GAUZE/BANDAGES/DRESSINGS) ×2 IMPLANT
BNDG ESMARK 4X9 LF (GAUZE/BANDAGES/DRESSINGS) ×2 IMPLANT
BNDG GAUZE ELAST 4 BULKY (GAUZE/BANDAGES/DRESSINGS) ×4 IMPLANT
CHLORAPREP W/TINT 26 (MISCELLANEOUS) ×2 IMPLANT
CLSR STERI-STRIP ANTIMIC 1/2X4 (GAUZE/BANDAGES/DRESSINGS) ×2 IMPLANT
CORD BIPOLAR FORCEPS 12FT (ELECTRODE) ×2 IMPLANT
COVER SURGICAL LIGHT HANDLE (MISCELLANEOUS) ×2 IMPLANT
COVER WAND RF STERILE (DRAPES) ×2 IMPLANT
CUFF TOURN SGL QUICK 18X4 (TOURNIQUET CUFF) ×2 IMPLANT
CUFF TOURN SGL QUICK 24 (TOURNIQUET CUFF)
CUFF TRNQT CYL 24X4X16.5-23 (TOURNIQUET CUFF) IMPLANT
DRAPE OEC MINIVIEW 54X84 (DRAPES) IMPLANT
DRAPE U-SHAPE 47X51 STRL (DRAPES) ×2 IMPLANT
DRILL 2.0 LNG QUICK RELEASE (BIT) ×2
DRSG XEROFORM 1X8 (GAUZE/BANDAGES/DRESSINGS) ×2 IMPLANT
GAUZE SPONGE 4X4 12PLY STRL (GAUZE/BANDAGES/DRESSINGS) ×2 IMPLANT
GLOVE SRG 8 PF TXTR STRL LF DI (GLOVE) ×1 IMPLANT
GLOVE SURG SYN 7.5  E (GLOVE) ×2
GLOVE SURG SYN 7.5 E (GLOVE) ×1 IMPLANT
GLOVE SURG UNDER POLY LF SZ8 (GLOVE) ×2
GOWN STRL REUS W/ TWL LRG LVL3 (GOWN DISPOSABLE) ×2 IMPLANT
GOWN STRL REUS W/TWL LRG LVL3 (GOWN DISPOSABLE) ×4
GUIDEWIRE ORTHO 0.054X6 (WIRE) ×6 IMPLANT
KIT BASIN OR (CUSTOM PROCEDURE TRAY) ×2 IMPLANT
KIT TURNOVER KIT B (KITS) ×2 IMPLANT
MANIFOLD NEPTUNE II (INSTRUMENTS) ×2 IMPLANT
NEEDLE HYPO 25GX1X1/2 BEV (NEEDLE) IMPLANT
NS IRRIG 1000ML POUR BTL (IV SOLUTION) ×2 IMPLANT
PACK ORTHO EXTREMITY (CUSTOM PROCEDURE TRAY) ×2 IMPLANT
PAD ARMBOARD 7.5X6 YLW CONV (MISCELLANEOUS) ×4 IMPLANT
PAD CAST 3X4 CTTN HI CHSV (CAST SUPPLIES) IMPLANT
PAD CAST 4YDX4 CTTN HI CHSV (CAST SUPPLIES) ×1 IMPLANT
PADDING CAST COTTON 3X4 STRL (CAST SUPPLIES)
PADDING CAST COTTON 4X4 STRL (CAST SUPPLIES) ×2
PLATE ACU LOC PROX STD LEFT (Plate) ×2 IMPLANT
SCREW 2.3X12MM (Screw) ×2 IMPLANT
SCREW CORT FT 18X2.3XLCK HEX (Screw) ×4 IMPLANT
SCREW CORTICAL LOCKING 2.3X14M (Screw) ×4 IMPLANT
SCREW CORTICAL LOCKING 2.3X18M (Screw) ×8 IMPLANT
SCREW HEXALOBE LOCKING 3.5X14M (Screw) ×2 IMPLANT
SCREW HEXALOBE NON-LOCK 3.5X14 (Screw) ×2 IMPLANT
SCREW LOCK 12X3.5X HEXALOBE (Screw) ×1 IMPLANT
SCREW LOCKING 3.5X12 (Screw) ×2 IMPLANT
SPONGE LAP 4X18 RFD (DISPOSABLE) IMPLANT
SUT PROLENE 4 0 PS 2 18 (SUTURE) ×4 IMPLANT
SYR CONTROL 10ML LL (SYRINGE) IMPLANT
TOWEL GREEN STERILE (TOWEL DISPOSABLE) ×2 IMPLANT
TOWEL GREEN STERILE FF (TOWEL DISPOSABLE) ×2 IMPLANT
TUBE CONNECTING 12X1/4 (SUCTIONS) ×2 IMPLANT
UNDERPAD 30X36 HEAVY ABSORB (UNDERPADS AND DIAPERS) ×2 IMPLANT
WATER STERILE IRR 1000ML POUR (IV SOLUTION) ×2 IMPLANT

## 2020-11-15 NOTE — Op Note (Signed)
PREOPERATIVE DIAGNOSIS: Displaced extra-articular left distal radius fracture  POSTOPERATIVE DIAGNOSIS: Same  ATTENDING PHYSICIAN: Maudry Mayhew. Jeannie Fend, III, MD who was present and scrubbed for the entire case   ASSISTANT SURGEON: None.   ANESTHESIA: General with MAC  SURGICAL PROCEDURES: Open reduction and internal fixation of left extra-articular distal radius fracture  SURGICAL INDICATIONS: Patient is a 60 year old female who was admitted to the hospital this past weekend.  She was found at the Covid positive and be in A. fib with RVR.  She had a multiple other medical comorbidities including end-stage renal disease.  She did complain of left hand and wrist pain and subsequent radiographs on admission were found to show a displaced left distal radius fracture.  Did recommend proceeding for surgical management of her wrist fracture due to the amount of displacement and her medical status.  We allowed her to be medically optimized in the ICU prior to proceeding forward with surgery.  At this time she was stable enough to go ahead and proceed forward with operative fixation of her left wrist and she presents today for that.  FINDING: Extra-articular left distal radius fracture was anatomically reduced and stable fixation was achieved using a volar locking plate and screw construct.  DESCRIPTION OF PROCEDURE: Patient was brought from the ICU directly to the operative suite due to her COVID-19 positive status.  In the operative suite I did discuss the risks of surgery with her as well as her brother over the phone.  These risks include but are not limited to infection, bleeding, damage to surrounding structures including blood vessels and nerves, pain, stiffness, malunion, nonunion, implant failure and need for additional procedures.  Informed consent was obtained the patient's left arm was marked.  She then underwent a left upper extremity plexus block by anesthesia.  A central line was started by  anesthesia as well at this time due to her difficult IV access and multiple IVs on the operative extremity.  At this point the patient was then transferred to the operative table and her hand was placed out on a hand table.  She was induced under MAC sedation.  A tourniquet was then placed on the upper arm.  Preoperative antibiotics were administered.  The left upper extremity was then prepped and draped in usual sterile fashion.  The limb was exsanguinated and the tourniquet was inflated.  A longitudinal, standard FCR approach was utilized.  Sharp dissection was carried down through subcutaneous tissues.  The sheath overlying the FCR tendon was incised and the tendon was mobilized ulnarly.  The deep FCR tendon sheath was then incised and the FPL tendon and muscle belly were bluntly mobilized ulnarly as well.  The pronator quadratus was then visualized and incised and elevated off the volar distal radius using a wood handle elevator.  This revealed a displaced distal radius fracture.  It was extra-articular in nature.  A Freer elevator was placed from the fracture site and used to joystick and elevate up the distal fragment.  This reduced anatomically in both the AP and lateral planes under fluoroscopic images..  A 0.062 K wire was placed into the radial styloid crossing the fracture site to help hold provisional fixation.   The proximally fitting standard with Acumed volar locking plate was then placed along the distal radius.  It was positioned appropriately in both the AP and lateral planes and pinned in the place using multiple K wires.  A cortical screw was then placed in the oblong hole proximal to the  fracture to allow for some compression of the plate along the volar distal radius.  There was still some elevation of the plate off the distal segment so clamp was placed to compress the plate and distal segment further.  Multiple locking screws were then placed throughout the distal aspect of the plate  securing distal fragment.  Appropriate position and screw placement was confirmed under fluoroscopic images.  At this point 2 additional locking screws were placed in the proximal aspect of the plate.  Fluoroscopy images were again obtained which showed anatomic reduction of the distal radius fracture with appropriate plate and screw positioning and appropriate screw lengths throughout.  The DRUJ was stressed in both pronation and supination and found to be stable.  At this point wound was copiously irrigated with normal saline.  The skin was then closed with interrupted 4-0 Prolene sutures.  Xeroform, 4 x 4's and a well-padded volar slab splint was placed.  The tourniquet was released and the patient had return of brisk capillary refill to all of her digits.  She was awoken from her sedation and taken back up to the ICU in stable condition.  She tolerated the procedure well and there were no complications.  RADIOGRAPHIC INTERPRETATION: AP, lateral and fossa lateral images were obtained under fluoroscopy intraoperative.  These show anatomic reduction of the extra-articular left distal radius fracture.  Volar locking plate and screw construct is in place with appropriate positioning and screw lengths throughout.  No new fractures or dislocations are noted.  ESTIMATED BLOOD LOSS: 15 mL  TOURNIQUET TIME: 31 minutes  SPECIMENS: None  POSTOPERATIVE PLAN: Patient will be transferred back to the ICU to continue inpatient care under the hospitalist service.  She should keep the hand elevated and work on digit range of motion exercises as her pain allows.  We'll plan on removing her splint and sutures at 2 weeks which she can be seen back in clinic at that time.  We will then progress her into either a short period of cast immobilization or removable splint pending her clinical improvement going forward.  IMPLANTS: Acumed distally fitting, standard width volar plate and screw construct.

## 2020-11-15 NOTE — Transfer of Care (Signed)
Immediate Anesthesia Transfer of Care Note  Patient: [name]  Procedure(s) Performed: OPEN REDUCTION INTERNAL FIXATION (ORIF) DISTAL RADIAL FRACTURE (Left )  Patient Location: PACU  Anesthesia Type:MAC and Regional  Level of Consciousness: responds to stimulation  Airway & Oxygen Therapy: Patient Spontanous Breathing and Patient connected to face mask oxygen  Post-op Assessment: Report given to RN and Post -op Vital signs reviewed and stable  Post vital signs: Reviewed and stable  Last Vitals:  Vitals Value Taken Time  BP    Temp    Pulse 95 11/15/20 1531  Resp 19 11/15/20 1531  SpO2 99 % 11/15/20 1531  Vitals shown include unvalidated device data.  Last Pain:  Vitals:   11/15/20 0800  TempSrc:   PainSc: 0-No pain      Patients Stated Pain Goal: 0 (14/99/69 2493)  Complications: No complications documented.

## 2020-11-15 NOTE — Progress Notes (Signed)
[facility] Progress Note  Patient: [name]    [id number]  DOA: 11/08/2020     Date of Service: the patient was seen and examined on 11/15/2020  Brief hospital course: Past medical history of ESRD on HD TTS, CAD, HTN, HLD, paroxysmal A. fib not on any anticoagulation secondary to [place], seizure disorder, CVA in 2015.  Presents with complaints of nausea vomiting and epigastric pain.  Found to have COVID-19 pneumonia incidentally along with A. fib with RVR and also acute on chronic anemia.  Currently plan is continue monitoring for stability of H&H and progression of DVT as well as follow-up on postop course after surgery.  Assessment and Plan: 1. Acute COVID-19 Viral Pneumonia CXR: hazy bilateral peripheral opacities Oxygen requirement: On room air CRP: 23.2-12.5.  Patient refused lab work on 1/26 Remdesivir: Completed on 1/25 Steroids: Not indicated given lack of hypoxia Baricitinib/Actemra: Not indicated given concern with bacterial infection Antibiotics: On IV cefepime last day 1/26 DVT Prophylaxis: heparin injection 5,000 Units Start: 11/12/20 1400 Place and maintain sequential compression device Start: 11/09/20 1030  Prone positioning and incentive spirometer use recommended.  Overall plan: From Covid point of view the patient remained stable Isolation duration: First positive test date 11/08/2020, can come off of the isolation on end of 10 days.  Day 0 is 11/08/2020.  Can be out of isolation on 11/19/2020  The treatment plan and use of medications and known side effects were discussed with patient/family. It was clearly explained that complete risks and long-term side effects are unknown. Patient/family agree with the treatment plan.    2.  Acute on chronic anemia. 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 Epigastric pain Constipation-resolved On presentation hemoglobin dropped down significantly from baseline of 11-6.8. No evidence of  acute bleeding or hemolysis.  No schistocytes seen on the smear. Lipase mildly elevated. CT abdomen shows no acute abnormality. X-ray abdomen also unremarkable. GI was consulted who recommended continued conservative measures, no intervention and currently signed off. Lipase elevation likely secondary to severe constipation currently resolved.. Not a candidate for any anticoagulation given her ongoing anemia. Continue PPI. Transfuse for hemoglobin less than 7 or hemodynamic instability.  3.  Paroxysmal A. fib with RVR Not on any anticoagulation given history of ICH. Cardiology was consulted Currently signed off. Recommend to continue amiodarone drip until the patient is through her surgery. Now that the patient is SP wrist surgery will discontinue IV amiodarone transition to oral amiodarone and transition patient out of the progressive care unit to telemetry. No further work-up recommended.  4.  ESRD on HD TTS Nephrology consulted. Appreciate assistance.  HD frequency per nephrology. Receiving Aranesp, calcitriol.  5.  Right CFV DVT Ultrasound lower extremity positive for DVT, nonocclusive, age indeterminant on right leg. Patient not a candidate for anticoagulation given her ongoing anemia as well as prior history of ICH. Discussed with daughter and recommended IVC filter. Discussed with IR recommend currently holding off on intervention.  Repeat Doppler on 11/16/2020. Patient remains at risk for progression of the DVT given her immobility secondary to her critical condition.  6.  Left wrist fracture Underwent surgery.  Tolerated well.  Will monitor postop recovery.  7.  Underweight. Likely severe protein calorie malnutrition. Placing the patient at risk for poor outcome Will consult dietary Body mass index is 18.12 kg/m.   8.  Acute metabolic encephalopathy. Likely multifactorial. No single etiology. Currently no stroke, no further intervention close monitoring.  Diet:  Regular diet  DVT Prophylaxis:   heparin injection 5,000 Units Start: 11/12/20 1400 Place and maintain sequential compression device Start: 11/09/20 1030    Advance goals of care discussion: Full code  Family Communication: no family was present at bedside, at the time of interview.  Discussed with daughter on the phone on 11/14/2020  Disposition:  Status is: Inpatient  Remains inpatient appropriate because:IV treatments appropriate due to intensity of illness or inability to take PO and Inpatient level of care appropriate due to severity of illness  Dispo: The patient is from: Home              Anticipated d/c is to: SNF              Anticipated d/c date is: > 3 days              Patient currently is not medically stable to d/c.   Difficult to place patient No  Subjective: No nausea no vomiting.  No fever no chills.  Appears somewhat tired today.  Physical Exam:  General: Appear in mild distress, no Rash; Oral Mucosa Clear, moist. no Abnormal Neck Mass Or lumps, Conjunctiva normal  Cardiovascular: S1 and S2 Present, no Murmur, Respiratory: normal respiratory effort, Bilateral Air entry present and no Crackles, no wheezes Abdomen: Bowel Sound present, Soft and no tenderness Extremities: trace Pedal edema Neurology: alert and oriented to time, place, and person affect appropriate. no new focal deficit Gait not checked due to patient safety concerns   Vitals:   11/15/20 1700 11/15/20 1800 11/15/20 1900 11/15/20 2000  BP: 116/89 (!) 108/49 111/83 99/81  Pulse:   88 95  Resp: 18 (!) '27 15 19  '$ Temp:    (!) 97 F (36.1 C)  TempSrc:    Axillary  SpO2:   98% 100%  Weight:        Intake/Output Summary (Last 24 hours) at 11/15/2020 2047 Last data filed at 11/15/2020 1900 Gross per 24 hour  Intake 986.84 ml  Output --  Net 986.84 ml   Filed Weights   11/11/20 1430 11/11/20 1721 11/12/20 0515  Weight: 44.2 kg 43.4 kg 43.5 kg    Data Reviewed: I have personally reviewed and  interpreted daily labs, tele strips, imaging. I reviewed all nursing notes, pharmacy notes, vitals, pertinent old records I have discussed plan of care as described above with RN and patient/family.  CBC: Recent Labs  Lab 11/09/20 0806 11/09/20 1008 11/09/20 1340 11/09/20 2241 11/10/20 0431 11/11/20 0530 11/11/20 2110 11/13/20 1027  WBC 5.9  --   --   --  6.1 6.0 6.5 7.7  NEUTROABS  --   --   --   --  5.0 4.3 5.4 6.3  HGB 6.8*   < >  --  10.7* 9.7* 11.5* 13.0 11.5*  HCT 20.6*   < >  --  31.8* 29.1* 32.8* 38.5 32.3*  MCV 88.0  --   --   --  84.1 83.0 82.3 82.2  PLT 255  --  249  --  239 328 387 355   < > = values in this interval not displayed.   Basic Metabolic Panel: Recent Labs  Lab 11/10/20 0431 11/11/20 0530 11/11/20 2110 11/12/20 0742 11/13/20 1027  NA 133* 133* 132* 135 133*  K 4.3 4.2 3.3* 3.6 4.2  CL 91* 91* 94* 95* 94*  CO2 21* 20* 21* 23 18*  GLUCOSE 101* 86 129* 108* 83  BUN 80* 98* 41* 53* 74*  CREATININE 10.53*  11.75* 6.48* 7.47* 9.40*  CALCIUM 8.8* 9.2 8.6* 9.2 8.9  MG 2.4 2.3 2.0 2.1 2.2    Studies: DG MINI C-ARM IMAGE ONLY  Result Date: 11/15/2020 There is no interpretation for this exam.  This order is for images obtained during a surgical procedure.  Please See "Surgeries" Tab for more information regarding the procedure.    Scheduled Meds: . amiodarone  400 mg Oral BID  . calcitRIOL  1 mcg Oral Q T,Th,Sa-HD  . Chlorhexidine Gluconate Cloth  6 each Topical Q0600  . darbepoetin (ARANESP) injection - NON-DIALYSIS  100 mcg Subcutaneous Q Sat-1800  . heparin injection (subcutaneous)  5,000 Units Subcutaneous Q8H  . levETIRAcetam  500 mg Oral BID  . methimazole  10 mg Oral BID  . midodrine  5 mg Oral BID WC  . pantoprazole (PROTONIX) IV  40 mg Intravenous Q12H  . polyethylene glycol  17 g Oral BID   Continuous Infusions: . ceFEPime (MAXIPIME) IV Stopped (11/14/20 2232)   PRN Meds: [DISCONTINUED] acetaminophen **OR** acetaminophen,  acetaminophen, fentaNYL (SUBLIMAZE) injection, prochlorperazine, sodium chloride flush  Time spent: 35 minutes  Author: Berle Mull, MD [facility] 11/15/2020 8:47 PM  To reach On-call, see care teams to locate the attending and reach out via [email or web address]. Between 7PM-7AM, please contact night-coverage If you still have difficulty reaching the attending provider, please page the [facility] (Director on Call) for [facility] on amion for assistance.

## 2020-11-15 NOTE — Anesthesia Postprocedure Evaluation (Signed)
Anesthesia Post Note  Patient: [name]  Procedure(s) Performed: OPEN REDUCTION INTERNAL FIXATION (ORIF) DISTAL RADIAL FRACTURE (Left )     Patient location during evaluation: PACU Anesthesia Type: Regional and MAC Level of consciousness: awake and alert Pain management: pain level controlled Vital Signs Assessment: post-procedure vital signs reviewed and stable Respiratory status: spontaneous breathing, nonlabored ventilation, respiratory function stable and patient connected to nasal cannula oxygen Cardiovascular status: stable and blood pressure returned to baseline Postop Assessment: no apparent nausea or vomiting Anesthetic complications: no   No complications documented.  Last Vitals:  Vitals:   11/15/20 0900 11/15/20 1000  BP: 98/61 111/90  Pulse: (!) 115 (!) 121  Resp: 16 (!) 25  Temp:    SpO2: 100% 100%    Last Pain:  Vitals:   11/15/20 0800  TempSrc:   PainSc: 0-No pain                 Twila Rappa,W. EDMOND

## 2020-11-15 NOTE — Progress Notes (Signed)
[facility] Progress Note  Name: [name] MRN: [id number] DOB: 1961/05/23   Subjective:  Seen in ICU, pt awake, pleasant, no c/o    Intake/Output Summary (Last 24 hours) at 11/15/2020 1501 Last data filed at 11/15/2020 0800 Gross per 24 hour  Intake 626.76 ml  Output -  Net 626.76 ml    Vitals:  Vitals:   11/15/20 0700 11/15/20 0800 11/15/20 0900 11/15/20 1000  BP: 106/72 (!) 117/59 98/61 111/90  Pulse: 77 (!) 110 (!) 115 (!) 121  Resp: (!) '22 18 16 '$ (!) 25  Temp:      TempSrc:      SpO2: 100% 100% 100% 100%  Weight:        Medications reviewed   Labs:  BMP Latest Ref Rng & Units 11/13/2020 11/12/2020 11/11/2020  Glucose 70 - 99 mg/dL 83 108(H) 129(H)  BUN 6 - 20 mg/dL 74(H) 53(H) 41(H)  Creatinine 0.44 - 1.00 mg/dL 9.40(H) 7.47(H) 6.48(H)  Sodium 135 - 145 mmol/L 133(L) 135 132(L)  Potassium 3.5 - 5.1 mmol/L 4.2 3.6 3.3(L)  Chloride 98 - 111 mmol/L 94(L) 95(L) 94(L)  CO2 22 - 32 mmol/L 18(L) 23 21(L)  Calcium 8.9 - 10.3 mg/dL 8.9 9.2 8.6(L)     Physical Exam:     alert, nad , chron ill appearing, nasal O2   Extremely tired, cachectic  no jvd  Chest cta bilat  Cor reg no RG  Abd soft ntnd no ascites   Ext no LE edema   Alert, NF, ox3    R AVF +bruit  OP HD: TTS [facility]   3h 14mn  38kg  2/2 bath  TDC  Hep none  - calc 120 ug tiw  - mircera 75 q2 last 08/23/20  - velphoro 2 ac tid   - last HD 3hrs on 1/18, mostly compliant.  - missed VVS post OP f/u - sp plication on [id number]  CXR 1/21 - FINDINGS: Central catheter tip is at the cavoatrial junction. There is ill-defined airspace opacity in the left lower lobe. Lungs elsewhere clear.   Assessment/Plan:   1. COVID -19 viral pna - hazy opacities on CXR, no O2 requirement. SP remdesivir. On IV cefepime. Will come out of isolation on 1/31 (10days).  2. Acute on chronic anemia - Hb 9.7 >> 11.5 after 2u prbc's on 1/21. gave aranesp 100 mcg on 1/22. GI consulted, conservative measures recommended.  Not a/c candidate.  3. ESRD - HD TTS.  Sp HD here Sunday and Tuesday. Next HD due today but will likely be postponed to tomorrow.  4. AMS - appreciate efforts of primary team. Not sure but may have memory loss at baseline.  5. L wrist fracture - to OR today 6. Hypotension - improved, BP's low 100's- 120's, no BP lowering meds here 7. Volume - no vol excess on exam. CXR no edema, LLL infiltrate. Wt's are off. Wt's up, lower vol w/ HD if BP's tolerate.  8. Parox afib w/ RVR - on IV amio to cont through pt's orthopedic surgery today.  fib  9. Metabolic bone disease - binders when taking PO. Continue calcitriol    RSol Blazing MD 11/15/2020 3:01 PM

## 2020-11-15 NOTE — Progress Notes (Signed)
OT Cancellation Note  Patient Details Name: [name] MRN: [id number] DOB: January 16, 1961   Cancelled Treatment:    Reason Eval/Treat Not Completed: Patient at procedure or test/ unavailable (surgery)  [place], OT/L   Acute OT Clinical Specialist [place] Pager [phone] Office [phone]  11/15/2020, 12:01 PM

## 2020-11-15 NOTE — Anesthesia Preprocedure Evaluation (Addendum)
Anesthesia Evaluation  Patient identified by MRN, date of birth, ID band Patient awake    Reviewed: Allergy & Precautions, NPO status , Patient's Chart, lab work & pertinent test results, reviewed documented beta blocker date and time   Airway Mallampati: II  TM Distance: >3 FB Neck ROM: Full    Dental no notable dental hx.    Pulmonary shortness of breath, with exertion and at rest, pneumonia, unresolved, former smoker,  Covid 19 + pneumonia   breath sounds clear to auscultation       Cardiovascular hypertension, Pt. on medications and Pt. on home beta blockers + CAD, + Past MI, + Cardiac Stents and + Peripheral Vascular Disease   Rhythm:Irregular Rate:Normal  Non STEMI 2009 Stents x 2 distal LAD and mid RCA Atrial fibrillation on amiodarone gtt  Echo 10/07/20 1. Left ventricular ejection fraction, by estimation, is 65 to 70%. The left ventricle has normal function. The left ventricle has no regional wall motion abnormalities. There is severe left ventricular hypertrophy. Left ventricular diastolic parameters are indeterminate. Elevated left atrial pressure. 2. Right ventricular systolic function is normal. The right ventricular size is normal. There is normal pulmonary artery systolic pressure. 3. Left atrial size was mildly dilated. 4. The mitral valve is normal in structure. No evidence of mitral valve regurgitation. No evidence of mitral stenosis. 5. The aortic valve is tricuspid. There is mild calcification of the aortic valve. There is mild thickening of the aortic valve. Aortic valve regurgitation is not visualized. No aortic stenosis is present. 6. The inferior vena cava is normal in size with greater than 50% respiratory variability, suggesting right atrial pressure of 3 mmHg.  EKG 11/11/20 Atrial fibrillation with VR 103/min, LVH   Neuro/Psych  Headaches, Seizures -, Well Controlled,  S/P ruptured cerebral aneurysm S/P  coiling CVA, Residual Symptoms negative psych ROS   GI/Hepatic negative GI ROS, Neg liver ROS,   Endo/Other  Hyperlipidemia  Renal/GU ESRF and DialysisRenal diseaseLast dialysis 1/25  negative genitourinary   Musculoskeletal Fx Left distal radius   Abdominal   Peds  Hematology  (+) anemia ,   Anesthesia Other Findings   Reproductive/Obstetrics                          Anesthesia Physical Anesthesia Plan  ASA: IV  Anesthesia Plan: General   Post-op Pain Management:    Induction: Intravenous  PONV Risk Score and Plan: 4 or greater and Treatment may vary due to age or medical condition  Airway Management Planned: LMA  Additional Equipment:   Intra-op Plan:   Post-operative Plan: Extubation in OR  Informed Consent: I have reviewed the patients History and Physical, chart, labs and discussed the procedure including the risks, benefits and alternatives for the proposed anesthesia with the patient or authorized representative who has indicated his/her understanding and acceptance.     Dental advisory given  Plan Discussed with: CRNA and Anesthesiologist  Anesthesia Plan Comments:         Anesthesia Quick Evaluation

## 2020-11-15 NOTE — Anesthesia Procedure Notes (Signed)
Anesthesia Regional Block: Supraclavicular block   Pre-Anesthetic Checklist: ,, timeout performed, Correct Patient, Correct Site, Correct Laterality, Correct Procedure, Correct Position, site marked, Risks and benefits discussed, pre-op evaluation,  At surgeon's request and post-op pain management  Laterality: Left  Prep: Maximum Sterile Barrier Precautions used, chloraprep       Needles:  Injection technique: Single-shot  Needle Type: Echogenic Stimulator Needle     Needle Length: 5cm  Needle Gauge: 22     Additional Needles:   Procedures:,,,, ultrasound used (permanent image in chart),,,,  Narrative:  Start time: 11/15/2020 1:48 PM End time: 11/15/2020 1:58 PM Injection made incrementally with aspirations every 5 mL. Anesthesiologist: Roderic Palau, MD  Additional Notes: 2% Lidocaine skin wheel.

## 2020-11-15 NOTE — Progress Notes (Signed)
Eston Esters, RN called and notified Pt's HD tx has been moved to1/28/2022.

## 2020-11-15 NOTE — Progress Notes (Signed)
   Ortho Hand Progress Note  Subjective: No acute hand events. OR today for ORIF L distal radius   Objective: Vital signs in last 24 hours: Temp:  [97.7 F (36.5 C)-98.3 F (36.8 C)] 98.3 F (36.8 C) (01/27 0337) Pulse Rate:  [68-121] 121 (01/27 1000) Resp:  [15-33] 25 (01/27 1000) BP: (94-130)/(50-103) 111/90 (01/27 1000) SpO2:  [96 %-100 %] 100 % (01/27 1000)  Intake/Output from previous day: 01/26 0701 - 01/27 0700 In: 930.1 [P.O.:220; I.V.:400.1; IV Piggyback:310] Out: -  Intake/Output this shift: Total I/O In: 16.7 [I.V.:16.7] Out: -   Recent Labs    11/13/20 1027  HGB 11.5*   Recent Labs    11/13/20 1027  WBC 7.7  RBC 3.93  HCT 32.3*  PLT 355   Recent Labs    11/13/20 1027  NA 133*  K 4.2  CL 94*  CO2 18*  BUN 74*  CREATININE 9.40*  GLUCOSE 83  CALCIUM 8.9   No results for input(s): LABPT, INR in the last 72 hours.  Aaox3 nad Resp nonlabored RRR Left upper extremity: Left hand and wrist are immobilized in a volar splint.  Exposed digits with no significant swelling.  She has tenderness palpation through her splint around the distal radius and ulna.  Compartments of the forearm are soft.  She has intact gentle active flexion extension of the fingers.  Intact sensation throughout her fingertips.  Fingertips are warm well perfused with brisk capillary refill.   Assessment/Plan: - Displaced left distal radius and ulna fracture -Acute COVID-19 infection -Abdominal pain -End-stage renal disease -Acute on chronic anemia -A. fib with RVR  - OR today for L distal radius ORIF. - risks, benefits and alternatives were discussed with the patient. These include infection, bleeding, damage to surrounding structures, pain, stiffness, malunion, nonunion, implant failure and need for additional procedures. - Consent obtained. Marked - Continue care per primary services post op. - Will need follow up in clinic as out patient at 10-14 days post  op   Remainder per primary    Avanell Shackleton III 11/15/2020, 12:48 PM  [phone]

## 2020-11-16 ENCOUNTER — Encounter (HOSPITAL_COMMUNITY): Payer: Self-pay | Admitting: Orthopaedic Surgery

## 2020-11-16 DIAGNOSIS — I4891 Unspecified atrial fibrillation: Secondary | ICD-10-CM | POA: Diagnosis not present

## 2020-11-16 LAB — POCT I-STAT EG7
Acid-base deficit: 9 mmol/L — ABNORMAL HIGH (ref 0.0–2.0)
Bicarbonate: 15.8 mmol/L — ABNORMAL LOW (ref 20.0–28.0)
Calcium, Ion: 0.98 mmol/L — ABNORMAL LOW (ref 1.15–1.40)
HCT: 27 % — ABNORMAL LOW (ref 36.0–46.0)
Hemoglobin: 9.2 g/dL — ABNORMAL LOW (ref 12.0–15.0)
O2 Saturation: 84 %
Potassium: 3 mmol/L — ABNORMAL LOW (ref 3.5–5.1)
Sodium: 145 mmol/L (ref 135–145)
TCO2: 17 mmol/L — ABNORMAL LOW (ref 22–32)
pCO2, Ven: 29 mmHg — ABNORMAL LOW (ref 44.0–60.0)
pH, Ven: 7.344 (ref 7.250–7.430)
pO2, Ven: 50 mmHg — ABNORMAL HIGH (ref 32.0–45.0)

## 2020-11-16 LAB — D-DIMER, QUANTITATIVE: D-Dimer, Quant: 4.14 ug/mL-FEU — ABNORMAL HIGH (ref 0.00–0.50)

## 2020-11-16 LAB — CBC WITH DIFFERENTIAL/PLATELET
Abs Immature Granulocytes: 0.15 10*3/uL — ABNORMAL HIGH (ref 0.00–0.07)
Basophils Absolute: 0 10*3/uL (ref 0.0–0.1)
Basophils Relative: 0 %
Eosinophils Absolute: 0 10*3/uL (ref 0.0–0.5)
Eosinophils Relative: 0 %
HCT: 33.2 % — ABNORMAL LOW (ref 36.0–46.0)
Hemoglobin: 12 g/dL (ref 12.0–15.0)
Immature Granulocytes: 1 %
Lymphocytes Relative: 9 %
Lymphs Abs: 1.1 10*3/uL (ref 0.7–4.0)
MCH: 28.4 pg (ref 26.0–34.0)
MCHC: 36.1 g/dL — ABNORMAL HIGH (ref 30.0–36.0)
MCV: 78.5 fL — ABNORMAL LOW (ref 80.0–100.0)
Monocytes Absolute: 0.6 10*3/uL (ref 0.1–1.0)
Monocytes Relative: 5 %
Neutro Abs: 9.5 10*3/uL — ABNORMAL HIGH (ref 1.7–7.7)
Neutrophils Relative %: 85 %
Platelets: 292 10*3/uL (ref 150–400)
RBC: 4.23 MIL/uL (ref 3.87–5.11)
RDW: 18.2 % — ABNORMAL HIGH (ref 11.5–15.5)
WBC: 11.3 10*3/uL — ABNORMAL HIGH (ref 4.0–10.5)
nRBC: 1.1 % — ABNORMAL HIGH (ref 0.0–0.2)

## 2020-11-16 LAB — GLUCOSE, CAPILLARY
Glucose-Capillary: 133 mg/dL — ABNORMAL HIGH (ref 70–99)
Glucose-Capillary: 115 mg/dL — ABNORMAL HIGH (ref 70–99)
Glucose-Capillary: 112 mg/dL — ABNORMAL HIGH (ref 70–99)

## 2020-11-16 LAB — COMPREHENSIVE METABOLIC PANEL
ALT: 11 U/L (ref 0–44)
AST: 16 U/L (ref 15–41)
Albumin: 2.3 g/dL — ABNORMAL LOW (ref 3.5–5.0)
Alkaline Phosphatase: 96 U/L (ref 38–126)
Anion gap: 20 — ABNORMAL HIGH (ref 5–15)
BUN: 67 mg/dL — ABNORMAL HIGH (ref 6–20)
CO2: 17 mmol/L — ABNORMAL LOW (ref 22–32)
Calcium: 9.6 mg/dL (ref 8.9–10.3)
Chloride: 94 mmol/L — ABNORMAL LOW (ref 98–111)
Creatinine, Ser: 8.38 mg/dL — ABNORMAL HIGH (ref 0.44–1.00)
GFR, Estimated: 5 mL/min — ABNORMAL LOW (ref >60)
Glucose, Bld: 118 mg/dL — ABNORMAL HIGH (ref 70–99)
Potassium: 5.2 mmol/L — ABNORMAL HIGH (ref 3.5–5.1)
Sodium: 131 mmol/L — ABNORMAL LOW (ref 135–145)
Total Bilirubin: 0.7 mg/dL (ref 0.3–1.2)
Total Protein: 5.5 g/dL — ABNORMAL LOW (ref 6.5–8.1)

## 2020-11-16 LAB — MAGNESIUM: Magnesium: 2.3 mg/dL (ref 1.7–2.4)

## 2020-11-16 LAB — C-REACTIVE PROTEIN: CRP: 18.5 mg/dL — ABNORMAL HIGH (ref <1.0)

## 2020-11-16 MED ORDER — DARBEPOETIN ALFA 100 MCG/0.5ML IJ SOSY: 100.0000 ug | PREFILLED_SYRINGE | INTRAMUSCULAR | Status: DC

## 2020-11-16 MED ORDER — SODIUM CHLORIDE 0.9 % IV BOLUS
250.0000 mL | Freq: Once | INTRAVENOUS | Status: AC
  Administered 2020-11-16: 250 mL via INTRAVENOUS

## 2020-11-16 MED ORDER — PANTOPRAZOLE SODIUM 40 MG PO TBEC
40.0000 mg | DELAYED_RELEASE_TABLET | Freq: Two times a day (BID) | ORAL | Status: DC
  Administered 2020-11-16 – 2020-11-27 (×21): 40 mg via ORAL
  Filled 2020-11-16 (×22): qty 1

## 2020-11-16 NOTE — TOC Initial Note (Signed)
Transition of Care [facility]) - Initial/Assessment Note    Patient Details  Name: [name] MRN: [id number] Date of Birth: 1961/07/21  [facility]) CM/SW Contact:    Bethann Berkshire, [place] Phone Number: 11/16/2020, 3:46 PM  Clinical Narrative:                  Pt is covid positive so CSW called pt on room phone. Pt answered and CSW attempted to discuss SNF recommendation. Pt began to complain of either hand or head pain. She requested that CSW come help with her hand/head. CSW explained he was a Education officer, museum and that nurse or doctor would help with her medically. CSW asked if she would like to continue to which she said yes. CSW continued to explain recommendation and SNF process but pt then stated "I don't know what you are talking about." Pt seemed confused. CSW requested to call pt's daughters; she is agreeable.   CSW called daughter laquita; phone number didn't work  CSW called daughter [name]; no answer, left voicemail requesting return call.  Expected Discharge Plan: Skilled Nursing Facility Barriers to Discharge: Continued Medical Work up   Patient Goals and CMS Choice        Expected Discharge Plan and Services Expected Discharge Plan: [place]                                              Prior Living Arrangements/Services                       Activities of Daily Living      Permission Sought/Granted                  Emotional Assessment       Orientation: : Oriented to Self,Oriented to Place Alcohol / Substance Use: Not Applicable Psych Involvement: No (comment)  Admission diagnosis:  Epigastric abdominal pain [R10.13] Fall [W19.XXXA] Atrial fibrillation with rapid ventricular response (HCC) [I48.91] Atrial fibrillation with RVR (HCC) [I48.91] Nausea and vomiting, intractability of vomiting not specified, unspecified vomiting type [R11.2] Patient Active Problem List   Diagnosis Date Noted  .  Epigastric abdominal pain 11/08/2020  . NSTEMI (non-ST elevated myocardial infarction) ([place]) 10/16/2020  . AMS (altered mental status) 10/06/2020  . Elevated troponin 10/06/2020  . Headache, unspecified 11/28/2019  . Hypercalcemia 11/10/2019  . Allergy, unspecified, initial encounter 08/08/2019  . Hypokalemia 07/07/2019  . Sepsis, unspecified organism ([place]) 02/03/2019  . End-stage renal disease on hemodialysis ([place]) 02/03/2019  . Unspecified atrial fibrillation ([place]) 02/03/2019  . Hypotension 02/02/2019  . Syncope and collapse   . Palliative care encounter   . Atrial fibrillation with rapid ventricular response ([place]) 03/20/2018  . Chills (without fever) 03/16/2018  . Iron deficiency anemia, unspecified 02/04/2018  . 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 Anemia in chronic kidney disease 12/30/2017  . Fever, unspecified 12/30/2017  . Generalized abdominal pain 12/30/2017  . Heart failure, unspecified ([place]) 12/30/2017  . Other specified personal risk factors, not elsewhere classified 12/30/2017  . Pure hypercholesterolemia, unspecified 12/30/2017  . Secondary hyperparathyroidism of renal origin ([place]) 12/30/2017  . Shortness of breath 12/30/2017  . History of embolic stroke [id number]  . Altered mental status 11/08/2013  . HTN (hypertension) 11/08/2013  . Hypertensive renal disease 11/06/2013  . Protein-calorie malnutrition, severe ([place]) 11/04/2013  . Seizure disorder ([place])  10/22/2013  . Hypertensive urgency 10/20/2013  . Hypertensive heart disease 05/07/2013  . Acute-on-chronic renal failure ([place]) 05/07/2013  . HTN (hypertension), malignant 05/06/2013  . CAD (coronary artery disease) 05/06/2013  . ESRD (end stage renal disease) ([place]) 05/06/2013  . Chronic diastolic heart failure ([place])   . Tobacco abuse 09/25/2012  . H/O noncompliance with medical treatment, presenting hazards to health   . Renal artery stenosis ([place]) 04/05/2012  . History of subarachnoid hemorrhage 03/03/2012   . Mixed hyperlipidemia 09/19/2008  . Acute on chronic diastolic heart failure ([place]) 09/19/2008   PCP:  Clovia Cuff, MD Pharmacy:   [id number] [place] [id number]: [phone]: [phone] [facility] [place] [id number]: [phone]: [phone]    Social Determinants of Health (SDOH) Interventions    Readmission Risk Interventions No flowsheet data found.

## 2020-11-16 NOTE — Progress Notes (Signed)
Floor RN Feliz Beam is aware patient Hemodialysis is scheduled for tomorrow.

## 2020-11-16 NOTE — Progress Notes (Signed)
[facility] Progress Note  Patient: [name]    [id number]  DOA: 11/08/2020     Date of Service: the patient was seen and examined on 11/16/2020  Brief hospital course: Past medical history of ESRD on HD TTS, CAD, HTN, HLD, paroxysmal A. fib not on any anticoagulation secondary to [place], seizure disorder, CVA in 2015.  Presents with complaints of nausea vomiting and epigastric pain.  Found to have COVID-19 pneumonia incidentally along with A. fib with RVR and also acute on chronic anemia.  Currently plan is repeat Doppler 1/29, monitor for improvement in mentation  Assessment and Plan: 1. Acute COVID-19 Viral Pneumonia CXR: hazy bilateral peripheral opacities Oxygen requirement: On room air CRP: Trending up, could be postoperative, monitor Remdesivir: Completed on 1/25 Steroids: Not indicated given lack of hypoxia Baricitinib/Actemra: Not indicated given concern with bacterial infection Antibiotics: Treated with IV cefepime. DVT Prophylaxis: heparin injection 5,000 Units Start: 11/12/20 1400 Place and maintain sequential compression device Start: 11/09/20 1030  Prone positioning and incentive spirometer use recommended.  Overall plan: From Covid point of view the patient remained stable Isolation duration: First positive test date 11/08/2020, can come off of the isolation on end of 10 days.  Day 0 is 11/08/2020.  Can be out of isolation on 11/19/2020  The treatment plan and use of medications and known side effects were discussed with patient/family. It was clearly explained that complete risks and long-term side effects are unknown. Patient/family agree with the treatment plan.    2.  Acute on chronic anemia. 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 Epigastric pain Constipation-resolved On presentation hemoglobin dropped down significantly from baseline of 11-6.8. No evidence of acute bleeding or hemolysis.  No schistocytes seen on the  smear. Lipase mildly elevated. CT abdomen shows no acute abnormality. X-ray abdomen also unremarkable. GI was consulted who recommended continued conservative measures, no intervention and currently signed off. Lipase elevation likely secondary to severe constipation currently resolved.. Not a candidate for any anticoagulation given her ongoing anemia. Continue PPI. Transfuse for hemoglobin less than 7 or hemodynamic instability.  3.  Paroxysmal A. fib with RVR Not on any anticoagulation given history of ICH. Cardiology was consulted Currently signed off. Recommend to continue amiodarone drip until the patient is through her surgery. Now that the patient is SP wrist surgery will discontinue IV amiodarone transition to oral amiodarone and transition patient out of the progressive care unit to telemetry. No further work-up recommended.  4.  ESRD on HD TTS Nephrology consulted. Appreciate assistance.  HD frequency per nephrology. Receiving Aranesp, calcitriol.  5.  Right CFV DVT Ultrasound lower extremity positive for DVT, nonocclusive, age indeterminant on right leg. Patient not a candidate for anticoagulation given her ongoing anemia as well as prior history of ICH. Discussed with daughter and recommended IVC filter. Discussed with IR recommend currently holding off on intervention.  Repeat Doppler on 11/17/2020. Patient remains at risk for progression of the DVT given her immobility secondary to her critical condition. D-dimer also trending up.  6.  Left wrist fracture Underwent surgery.  Tolerated well.  Will monitor postop recovery.  7.  Underweight. Likely severe protein calorie malnutrition. Placing the patient at risk for poor outcome Will consult dietary Body mass index is 18.12 kg/m.   8.  Acute metabolic encephalopathy. Likely multifactorial. No single etiology. Currently no stroke, no further intervention close monitoring.  Diet: Regular diet DVT Prophylaxis:    heparin injection 5,000 Units Start: 11/12/20 1400  Place and maintain sequential compression device Start: 11/09/20 1030    Advance goals of care discussion: Full code  Family Communication: no family was present at bedside, at the time of interview.  Discussed with daughter on the phone on 11/14/2020  Disposition:  Status is: Inpatient  Remains inpatient appropriate because:IV treatments appropriate due to intensity of illness or inability to take PO and Inpatient level of care appropriate due to severity of illness  Dispo: The patient is from: Home              Anticipated d/c is to: SNF              Anticipated d/c date is: > 3 days              Patient currently is not medically stable to d/c.   Difficult to place patient No  Subjective: No nausea no vomiting.  Remains confused.  No acute complaint.  No acute events overnight.  Physical Exam:  General: Appear in mild distress, no Rash; Oral Mucosa Clear, moist. no Abnormal Neck Mass Or lumps, Conjunctiva normal  Cardiovascular: S1 and S2 Present, no Murmur, Respiratory: normal respiratory effort, Bilateral Air entry present and no Crackles, no wheezes Abdomen: Bowel Sound present, Soft and no tenderness Extremities: trace Pedal edema Neurology: alert and oriented to time, place, and person affect appropriate. no new focal deficit Gait not checked due to patient safety concerns   Vitals:   11/16/20 0100 11/16/20 0200 11/16/20 0300 11/16/20 0800  BP: (!) 117/47 113/84  (!) 114/59  Pulse: 70 70    Resp: (!) '27 15  20  '$ Temp:   (!) 97.3 F (36.3 C)   TempSrc:   Axillary   SpO2: 100% 97%  100%  Weight:        Intake/Output Summary (Last 24 hours) at 11/16/2020 2104 Last data filed at 11/16/2020 1300 Gross per 24 hour  Intake 391.64 ml  Output 0 ml  Net 391.64 ml   Filed Weights   11/11/20 1430 11/11/20 1721 11/12/20 0515  Weight: 44.2 kg 43.4 kg 43.5 kg    Data Reviewed: I have personally reviewed and  interpreted daily labs, tele strips, imaging. I reviewed all nursing notes, pharmacy notes, vitals, pertinent old records I have discussed plan of care as described above with RN and patient/family.  CBC: Recent Labs  Lab 11/11/20 0530 11/11/20 2110 11/13/20 1027 11/15/20 1413 11/15/20 2033 11/16/20 1902  WBC 6.0 6.5 7.7  --  8.6 11.3*  NEUTROABS 4.3 5.4 6.3  --  7.0 9.5*  HGB 11.5* 13.0 11.5* 9.2* 11.3* 12.0  HCT 32.8* 38.5 32.3* 27.0* 32.6* 33.2*  MCV 83.0 82.3 82.2  --  80.5 78.5*  PLT 328 387 355  --  326 [id number]   Basic Metabolic Panel: Recent Labs  Lab 11/11/20 2110 11/12/20 0742 11/13/20 1027 11/15/20 1413 11/15/20 2033 11/16/20 1902  NA 132* 135 133* 145 134* 131*  K 3.3* 3.6 4.2 3.0* 4.5 5.2*  CL 94* 95* 94*  --  96* 94*  CO2 21* 23 18*  --  17* 17*  GLUCOSE 129* 108* 83  --  114* 118*  BUN 41* 53* 74*  --  57* 67*  CREATININE 6.48* 7.47* 9.40*  --  7.87* 8.38*  CALCIUM 8.6* 9.2 8.9  --  9.9 9.6  MG 2.0 2.1 2.2  --  2.1 2.3    Studies: No results found.  Scheduled Meds: . amiodarone  400 mg Oral BID  .  calcitRIOL  1 mcg Oral Q T,Th,Sa-HD  . Chlorhexidine Gluconate Cloth  6 each Topical Q0600  . [START ON 11/19/2020] darbepoetin (ARANESP) injection - NON-DIALYSIS  100 mcg Subcutaneous Q Mon-1800  . heparin injection (subcutaneous)  5,000 Units Subcutaneous Q8H  . levETIRAcetam  500 mg Oral BID  . methimazole  10 mg Oral BID  . midodrine  5 mg Oral BID WC  . pantoprazole  40 mg Oral BID  . polyethylene glycol  17 g Oral BID   Continuous Infusions:  PRN Meds: [DISCONTINUED] acetaminophen **OR** acetaminophen, acetaminophen, sodium chloride flush  Time spent: 35 minutes  Author: Berle Mull, MD [facility] 11/16/2020 9:04 PM  To reach On-call, see care teams to locate the attending and reach out via [email or web address]. Between 7PM-7AM, please contact night-coverage If you still have difficulty reaching the attending provider, please page the [facility]  (Director on Call) for [facility] on amion for assistance.

## 2020-11-16 NOTE — Care Management Important Message (Signed)
Important Message  Patient Details  Name: [name] MRN: [id number] Date of Birth: 1961/04/30   Medicare Important Message Given:  Yes  Pt. On Airborne and Contact precautions,[name]. Gave the IM letter to pt.     Holli Humbles Smith 11/16/2020, 1:01 PM

## 2020-11-16 NOTE — Progress Notes (Signed)
[facility] Progress Note  Name: [name] MRN: [id number] DOB: 1960/12/05   Subjective:  Seen in room, no c/o's    Intake/Output Summary (Last 24 hours) at 11/16/2020 1336 Last data filed at 11/16/2020 0900 Gross per 24 hour  Intake 798.55 ml  Output --  Net 798.55 ml    Vitals:  Vitals:   11/16/20 0100 11/16/20 0200 11/16/20 0300 11/16/20 0800  BP: (!) 117/47 113/84  (!) 114/59  Pulse: 70 70    Resp: (!) '27 15  20  '$ Temp:   (!) 97.3 F (36.3 C)   TempSrc:   Axillary   SpO2: 100% 97%  100%  Weight:        Medications reviewed   Labs:  BMP Latest Ref Rng & Units 11/15/2020 11/15/2020 11/13/2020  Glucose 70 - 99 mg/dL 114(H) - 83  BUN 6 - 20 mg/dL 57(H) - 74(H)  Creatinine 0.44 - 1.00 mg/dL 7.87(H) - 9.40(H)  Sodium 135 - 145 mmol/L 134(L) 145 133(L)  Potassium 3.5 - 5.1 mmol/L 4.5 3.0(L) 4.2  Chloride 98 - 111 mmol/L 96(L) - 94(L)  CO2 22 - 32 mmol/L 17(L) - 18(L)  Calcium 8.9 - 10.3 mg/dL 9.9 - 8.9     Physical Exam:     alert, nad , chron ill appearing  no jvd  Chest cta bilat  Cor reg no RG  Abd soft ntnd no ascites   Ext no LE edema   Alert, NF    R AVF +bruit  OP HD: TTS [facility]   3h 75mn  38kg  2/2 bath  TDC  Hep none  - calc 120 ug tiw  - mircera 75 q2 last 08/23/20  - velphoro 2 ac tid   - last HD 3hrs on 1/18, mostly compliant.  - missed VVS post OP f/u - sp plication on [id number]  CXR 1/21 - FINDINGS: Central catheter tip is at the cavoatrial junction. There is ill-defined airspace opacity in the left lower lobe. Lungs elsewhere clear.   Assessment/Plan:  1. COVID -19 viral pna - hazy opacities on CXR. SP remdesivir. On IV cefepime. Will come out of isolation on 1/31 (10days). On room air.  2. Acute on chronic anemia - Hb 9.7 >> 11.5 after 2u prbc's on 1/21. gave aranesp 100 mcg on 1/22. GI consulted, conservative measures recommended. Not a/c candidate.  3. ESRD - HD TTS.  Sp HD here Sunday and Tuesday. Next HD today off schedule.  No HD tomorrow, next HD then will be Monday.  4. AMS - appreciate efforts of primary team. Not sure but may have memory loss at baseline.  5. L wrist fracture - sp ORIF 1/27 by ortho 6. Hypotension - improved, BP's low 100's- 120's, no BP lowering meds here 7. Volume - no vol excess on exam. CXR no edema, LLL infiltrate. Wt's are off.  8. Parox afib w/ RVR - on IV amio to cont through pt's orthopedic surgery today.  fib  9. Metabolic bone disease - binders when taking PO. Continue calcitriol    RSol Blazing MD 11/16/2020 1:36 PM

## 2020-11-16 NOTE — Evaluation (Signed)
Physical Therapy Evaluation Patient Details Name: [name] MRN: [id number] DOB: 09/20/1961 Today's Date: 11/16/2020   History of Present Illness  60 y.o. female with medical history significant of ESRD on HD TTS, CAD, hypertension, hyperlipidemia, paroxysmal A. 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 Found to be in A-fib with RVR, unvaccinated, COVID+, CT showing small L pleural effusion at the L lung base. Reports L wrist pain from daughter falling on her wrist prior to coming to ED found to have fx and  L UE wrist splint applied in ED. NWB will eventually require ORIF. Rapid response 11/09/20 transfer to ICU, placement of untunneled R femoral central venous catheter, D/c'd 11/11/20  Clinical Impression  Pt is up to move on side of bed, with min to mod assist from PT.  Her LUE pain is limiting after surgery yesterday.  Pt is getting Tylenol from nursing to manage the pain, and asking for help to move to the chair.  Walked a short trip after doing bed ex, and pt is assisted to comfortable perch on chair with pillows as needed and LE's elevated.  Follow up as needed for safety and quality of gait.    Follow Up Recommendations SNF    Equipment Recommendations  3in1 (PT)    Recommendations for Other Services OT consult     Precautions / Restrictions Precautions Precautions: Fall Precaution Comments: NWB on LUE Required Braces or Orthoses: Splint/Cast Splint/Cast: L wrist volar splint Restrictions Weight Bearing Restrictions: Yes LUE Weight Bearing: Non weight bearing      Mobility  Bed Mobility Overal bed mobility: Needs Assistance Bed Mobility: Rolling;Supine to Sit Rolling: Min assist   Supine to sit: Mod assist          Transfers Overall transfer level: Needs  assistance Equipment used: 1 person hand held assist Transfers: Sit to/from Stand Sit to Stand: Mod assist            Ambulation/Gait Ambulation/Gait assistance: Min assist Gait Distance (Feet): 5 Feet Assistive device: 1 person hand held assist Gait Pattern/deviations: Step-through pattern;Wide base of support;Trunk flexed     General Gait Details: poor standing balance after getting up to stand bedsie  Stairs            Wheelchair Mobility    Modified Rankin (Stroke Patients Only)       Balance Overall balance assessment: Needs assistance;History of Falls   Sitting balance-Leahy Scale: Fair     Standing balance support: Single extremity supported Standing balance-Leahy Scale: Poor                               Pertinent Vitals/Pain Pain Assessment: 0-10 Pain Score: 8  Pain Location: LUE with any shifting of arm and while still as well Pain Descriptors / Indicators: Grimacing Pain Intervention(s): Limited activity within patient's tolerance;Monitored during session;Premedicated before session;Repositioned;RN gave pain meds during session    Home Living                        Prior Function                 Hand Dominance        Extremity/Trunk Assessment  Communication      Cognition Arousal/Alertness: Awake/alert Behavior During Therapy: Flat affect Overall Cognitive Status: Impaired/Different from baseline Area of Impairment: Awareness;Safety/judgement;Following commands;Memory;Attention;Orientation                 Orientation Level: Situation;Time Current Attention Level: Selective Memory: Decreased recall of precautions;Decreased short-term memory Following Commands: Follows one step commands inconsistently;Follows one step commands with increased time Safety/Judgement: Decreased awareness of safety;Decreased awareness of deficits Awareness: Intellectual Problem Solving: Slow  processing;Requires verbal cues;Requires tactile cues General Comments: Pt is mainly focused on pain and not willing to move much with it      General Comments General comments (skin integrity, edema, etc.): Pt is up to side of bed with help, but limited to take her to chair and walk due to self limiting.  Finally agreed to get up to chair with a moment of pain control being a struggle, but finally agreed to walk to chair    Exercises General Exercises - Lower Extremity Ankle Circles/Pumps: AROM;5 reps Quad Sets: AROM;10 reps Long Arc Quad: AROM;Both Heel Slides: AROM;Both;10 reps Hip ABduction/ADduction: AAROM;10 reps   Assessment/Plan    PT Assessment    PT Problem List         PT Treatment Interventions      PT Goals (Current goals can be found in the Care Plan section)  Acute Rehab PT Goals Patient Stated Goal: none    Frequency Min 2X/week   Barriers to discharge        Co-evaluation               AM-PAC PT "6 Clicks" Mobility  Outcome Measure Help needed turning from your back to your side while in a flat bed without using bedrails?: A Little Help needed moving from lying on your back to sitting on the side of a flat bed without using bedrails?: A Lot Help needed moving to and from a bed to a chair (including a wheelchair)?: A Lot Help needed standing up from a chair using your arms (e.g., wheelchair or bedside chair)?: A Lot Help needed to walk in hospital room?: A Lot Help needed climbing 3-5 steps with a railing? : A Lot 6 Click Score: 13    End of Session Equipment Utilized During Treatment: Gait belt Activity Tolerance: Patient tolerated treatment well Patient left: in chair;with call bell/phone within reach;with chair alarm set Nurse Communication: Mobility status PT Visit Diagnosis: Muscle weakness (generalized) (M62.81);Other abnormalities of gait and mobility (R26.89);Pain;Difficulty in walking, not elsewhere classified (R26.2) Pain -  Right/Left: Left Pain - part of body: Hand;Arm    Time: [id number] PT Time Calculation (min) (ACUTE ONLY): 38 min   Charges:     PT Treatments $Therapeutic Exercise: 8-22 mins $Therapeutic Activity: 23-37 mins       Ramond Dial 11/16/2020, 8:17 PM  Mee Hives, PT MS Acute Rehab Dept. Number: [place] and [place]

## 2020-11-17 ENCOUNTER — Inpatient Hospital Stay (HOSPITAL_COMMUNITY): Payer: 59

## 2020-11-17 DIAGNOSIS — I82409 Acute embolism and thrombosis of unspecified deep veins of unspecified lower extremity: Secondary | ICD-10-CM | POA: Diagnosis not present

## 2020-11-17 DIAGNOSIS — U071 COVID-19: Secondary | ICD-10-CM | POA: Diagnosis not present

## 2020-11-17 DIAGNOSIS — I4891 Unspecified atrial fibrillation: Secondary | ICD-10-CM | POA: Diagnosis not present

## 2020-11-17 MED ORDER — HEPARIN SODIUM (PORCINE) 1000 UNIT/ML IJ SOLN
INTRAMUSCULAR | Status: AC
  Filled 2020-11-17: qty 4

## 2020-11-17 MED ORDER — DARBEPOETIN ALFA 100 MCG/0.5ML IJ SOSY
100.0000 ug | PREFILLED_SYRINGE | INTRAMUSCULAR | Status: DC
  Filled 2020-11-17: qty 0.5

## 2020-11-17 NOTE — Progress Notes (Signed)
Spoke with RN in HD maybe done later today ok to send pt for CTscan.

## 2020-11-17 NOTE — TOC Progression Note (Signed)
Transition of Care [facility]) - Progression Note    Patient Details  Name: [name] MRN: [id number] Date of Birth: Sep 10, 1961  Transition of Care [facility]) CM/SW [place], LCSW Phone Number:[phone] 11/17/2020, 2:17 PM  Clinical Narrative:     CSW was alerted by MD Posey Pronto that pt's daughter wants her to go home. SNF option will not continued to be pursued. CSW alerted RN CM of change in dispo plan.  TOC team will continue to assist with discharge planning needs.  Expected Discharge Plan: Skilled Nursing Facility Barriers to Discharge: Continued Medical Work up  Expected Discharge Plan and Services Expected Discharge Plan: [place]                                               Social Determinants of Health (SDOH) Interventions    Readmission Risk Interventions No flowsheet data found.

## 2020-11-17 NOTE — Consult Note (Signed)
Neurosurgery Consultation  Reason for Consult: Subdural hematomas Referring Physician: Posey Pronto  CC: Confusion  HPI: This is a 60 y.o. woman w/ h/o ESRD, PAF, epilepsy, prior stroke, p/w N/V and abdominal pain. Stay notable for +COVID PNA, Afib with RVR, anemia. She had confusion during her stay, which prompted CTH, showing new bilateral subdural hematomas. Stay also notable for left wrist frx s/p repair and R CFV DVT. 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 She had R MCA and R ICA aneurysms clipped prior to entering our system, clips were in place on first CT prior to her ruptured basilar. The patient is unfortunately a poor historian and does not know she's in the hospital. She denies any headaches, does endorse nausea but no vomiting, no changes in vision.  ROS: A 14 point ROS was performed and is negative except as noted in the HPI but limited due to pt's mental status.   PMHx:  Past Medical History:  Diagnosis Date  . Anterior cerebral artery aneurysm    1996 AT [facility]  . CAD (coronary artery disease)    a. 2009 for NSTEMI with 70% dLAD, 30% mCx, 50% dCx, 95% dRCA, 40% PLA -> RCA was treated with DES.  . Diastolic heart failure    LVEF 60-65% with grade 2 diastolic dysfunction - July 2014  . Encephalopathy   . ESRD (end stage renal disease) on dialysis ([place])   . HTN (hypertension)    Resistant  . Hypercholesterolemia   . PAF (paroxysmal atrial fibrillation) ([place]) 02/03/2019  . Poor historian   . Seizures ([place])   . Stroke (cerebrum) ([place]) 2015  . Subarachnoid hemorrhage ([place])    03/2012  . Tobacco abuse    Resumed smoking half a pack a day. She was a smoker in the past and states she was abl eto stop in the past using nicotine patches   FamHx:  Family History  Problem Relation Age of Onset  . Heart disease Mother   . Heart disease Father   . Coronary artery  disease Other        Premature in 1st degree relatives   SocHx:  reports that she quit smoking about 3 years ago. Her smoking use included cigarettes. She has a 7.00 pack-year smoking history. She has never used smokeless tobacco. She reports that she does not drink alcohol and does not use drugs.  Exam: Vital signs in last 24 hours: Temp:  [97.8 F (36.6 C)-98.6 F (37 C)] 98.2 F (36.8 C) (01/30 1100) Pulse Rate:  [99-122] 108 (01/30 1100) Resp:  [18-29] 20 (01/30 1100) BP: (68-108)/(42-88) 94/69 (01/30 1100) SpO2:  [95 %-100 %] 99 % (01/30 1100) Weight:  [47.4 kg] 47.4 kg (01/30 0343) General: Awake, alert, cooperative, lying in bed in NAD Head: Normocephalic and atruamatic, +shunt system palpable in R frontal region HEENT: Neck supple Pulmonary: breathing room air comfortably, no evidence of increased work of breathing Cardiac: Irregularly irregular Psych: mildly flat, affect mildly reactive Extremities: Warm and well perfused, LUE w/ wrist cast in place, pt refused to move fingers of L hand due to pain Neuro: Awake/alert, Ox1 but interactive and able to repeat things back, speech fluent with normal content, face grossly symmetric, FCx4 without preference, pain limited in the entire LUE   Assessment and Plan: 60 y.o. 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 [facility] personally reviewed,  there are bilateral subdural collections of mixed intensity, L>R measuring without significant brain compression. R F shunt catheter in place, Codman Hakim valve appears to be set to 100, R MCA, R ICA aneurysm clips and basilar tip coils, prior R crani, L parietal hypodensity.   -The collections are small and I would not expect them to be asymptomatic. I dialed her shunt from 100-->150 today. If exam remains stable, get a repeat [facility] 1/31 to see if she can tolerate a higher pressure to prevent further SDH collection. Given the natural history of IVH-related HCP and the lack  of a shunt revision in 8.5 years, I suspect she is shunt independent.  -Regarding her left parietal hypodensity, it is not present on CT 11/03/2013 and then suddenly appears on 11/10/13 and continues to enlarge on subsequent scans until almost the size it is now. Very consistent with prior infarct, a tumor would not grow over days and suddenly stop growing for 6 years. From an MRI-safety standpoint, her aneurysm coils were placed in 02/2012 and are MR conditional just as other aneurysm coils are. The clips were present on her earliest available CT in [id number], they are helical coiled-spring type, suggesting but not proving they are titanium. If family can find out what year they are implanted, which could help determine the safety of obtaining an MRI.   -rpt CTH tomorrow to evaluate ventricular size and subdurals  -please call with any concerns or questions  Judith Part, MD 11/18/20 11:59 AM [facility]

## 2020-11-17 NOTE — Progress Notes (Addendum)
[facility] Progress Note  Patient: [name]    [id number]  DOA: 11/08/2020     Date of Service: the patient was seen and examined on 11/17/2020  Brief hospital course: Past medical history of ESRD on HD TTS, CAD, HTN, HLD, paroxysmal A. fib not on any anticoagulation secondary to [place], seizure disorder, CVA in 2015.  Presents with complaints of nausea vomiting and epigastric pain.  Found to have COVID-19 pneumonia incidentally along with A. fib with RVR and also acute on chronic anemia.  Currently plan is repeat Doppler 1/29, monitor for improvement in mentation  Assessment and Plan: 1. Acute COVID-19 Viral Pneumonia CXR: hazy bilateral peripheral opacities Oxygen requirement: On room air CRP: Trending up, could be postoperative, monitor Remdesivir: Completed on 1/25 Steroids: Not indicated given lack of hypoxia Baricitinib/Actemra: Not indicated given concern with bacterial infection Antibiotics: Treated with IV cefepime for 5 days DVT Prophylaxis: Place and maintain sequential compression device Start: 11/09/20 1030  Prone positioning and incentive spirometer use recommended.  Overall plan: From Covid point of view the patient remained stable Isolation duration: First positive test date 11/08/2020, can come off of the isolation on end of 10 days.  Day 0 is 11/08/2020.  Can be out of isolation on 11/19/2020  The treatment plan and use of medications and known side effects were discussed with patient/family. It was clearly explained that complete risks and long-term side effects are unknown. Patient/family agree with the treatment plan.    2. Acute on chronic anemia. 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 Epigastric pain Constipation-resolved On presentation hemoglobin dropped down significantly from baseline of 11-6.8. No evidence of acute bleeding or hemolysis.  No schistocytes seen on the smear. Lipase mildly elevated. CT abdomen shows  no acute abnormality. X-ray abdomen also unremarkable. GI was consulted who recommended continued conservative measures, no intervention and currently signed off. Lipase elevation likely secondary to severe constipation currently resolved.. Not a candidate for any anticoagulation given her ongoing anemia. Continue PPI. Transfuse for hemoglobin less than 7 or hemodynamic instability.  3.  Paroxysmal A. fib with RVR Not on any anticoagulation given history of ICH. Cardiology was consulted Currently signed off. Recommend to continue amiodarone drip until the patient is through her surgery. Now that the patient is SP wrist surgery will discontinue IV amiodarone transition to oral amiodarone and transition patient out of the progressive care unit to telemetry. No further work-up recommended.  4.  ESRD on HD TTS Nephrology consulted. Appreciate assistance.  HD frequency per nephrology. Receiving Aranesp, calcitriol.  5.  Right CFV DVT Ultrasound lower extremity positive for DVT, nonocclusive, age indeterminant on right leg. Patient not a candidate for anticoagulation given her ongoing anemia as well as prior history of ICH. Discussed with daughter and recommended IVC filter. Discussed with IR recommend currently holding off on intervention.  Patient remains at risk for progression of the DVT given her immobility secondary to her critical condition. D-dimer also trending up. Repeat Doppler on 1/29 shows prior clot appears to have mostly resolved. no propagation is seen.   6.  Left wrist fracture Underwent surgery.  Tolerated well.  Will monitor postop recovery.  7.  Underweight. Likely severe protein calorie malnutrition. Placing the patient at risk for poor outcome Will consult dietary Body mass index is 20.54 kg/m.   8.  Acute metabolic encephalopathy. History of VP shunt for obstructive hydrocephalus 2013 Bilateral subdural fluid collection with with some mass-effect. Left  parietal lobe hypodensity  concerning for glioma Likely multifactorial. No single etiology. CT scan was performed which shows subdural collection. Appreciate neurosurgery consultation although do not think that this is symptomatic in nature. Also has an ongoing chronic appearing hypodensity in the left parietal lobe which appears to be progressing and likely concerning for glioma or infection like lesion.  Currently pursuing MRI although I do not think that the VP shunt is MRI compatible. Repeat CT scan 11/19/2020.  Diet: Regular diet DVT Prophylaxis:   Place and maintain sequential compression device Start: 11/09/20 1030  Advance goals of care discussion: Full code  Family Communication: no family was present at bedside, at the time of interview.  Discussed with daughter on the phone on 11/14/2020  Disposition:  Status is: Inpatient  Remains inpatient appropriate because:IV treatments appropriate due to intensity of illness or inability to take PO and Inpatient level of care appropriate due to severity of illness  Dispo: The patient is from: Home              Anticipated d/c is to: SNF              Anticipated d/c date is: > 3 days              Patient currently is not medically stable to d/c.   Difficult to place patient No  Subjective: No nausea no vomiting but no fever no chills.  No chest pain.  No abdominal pain.  No diarrhea.  Remains with a flat affect.  Not oriented.  Physical Exam:  General: Appear in mild distress, no Rash; Oral Mucosa Clear, moist. no Abnormal Neck Mass Or lumps, Conjunctiva normal  Cardiovascular: S1 and S2 Present, no Murmur, Respiratory: Normal respiratory effort, Bilateral Air entry present and no crackles, no wheezes Abdomen: Bowel Sound present, Soft and no tenderness Extremities: trace Pedal edema Neurology: alert and oriented to time, place, and person affect appropriate. no new focal deficit Gait not checked due to patient safety  concerns  Vitals:   11/17/20 0600 11/17/20 0605 11/17/20 0700 11/17/20 1220  BP:  102/63    Pulse:  76    Resp: (!) 23 19    Temp:  97.8 F (36.6 C) (!) 97.5 F (36.4 C) 97.8 F (36.6 C)  TempSrc:  Oral Oral Oral  SpO2:  100% 100%   Weight:        Intake/Output Summary (Last 24 hours) at 11/17/2020 1655 Last data filed at 11/17/2020 0900 Gross per 24 hour  Intake 60 ml  Output --  Net 60 ml   Filed Weights   11/11/20 1721 11/12/20 0515 11/16/20 2200  Weight: 43.4 kg 43.5 kg 49.3 kg    Data Reviewed: I have personally reviewed and interpreted daily labs, tele strips, imaging. I reviewed all nursing notes, pharmacy notes, vitals, pertinent old records I have discussed plan of care as described above with RN and patient/family.  CBC: Recent Labs  Lab 11/11/20 0530 11/11/20 2110 11/13/20 1027 11/15/20 1413 11/15/20 2033 11/16/20 1902  WBC 6.0 6.5 7.7  --  8.6 11.3*  NEUTROABS 4.3 5.4 6.3  --  7.0 9.5*  HGB 11.5* 13.0 11.5* 9.2* 11.3* 12.0  HCT 32.8* 38.5 32.3* 27.0* 32.6* 33.2*  MCV 83.0 82.3 82.2  --  80.5 78.5*  PLT 328 387 355  --  326 [id number]   Basic Metabolic Panel: Recent Labs  Lab 11/11/20 2110 11/12/20 0742 11/13/20 1027 11/15/20 1413 11/15/20 2033 11/16/20 1902  NA 132* 135 133* 145 134*  131*  K 3.3* 3.6 4.2 3.0* 4.5 5.2*  CL 94* 95* 94*  --  96* 94*  CO2 21* 23 18*  --  17* 17*  GLUCOSE 129* 108* 83  --  114* 118*  BUN 41* 53* 74*  --  57* 67*  CREATININE 6.48* 7.47* 9.40*  --  7.87* 8.38*  CALCIUM 8.6* 9.2 8.9  --  9.9 9.6  MG 2.0 2.1 2.2  --  2.1 2.3    Studies: CT HEAD WO CONTRAST  Result Date: 11/17/2020 CLINICAL DATA:  60 year old female with acute mental status changes EXAM: CT HEAD WITHOUT CONTRAST TECHNIQUE: Contiguous axial images were obtained from the base of the skull through the vertex without intravenous contrast. COMPARISON:  10/06/2020, 02/16/2018, 11/11/2013 FINDINGS: Brain: New bilateral frontal parietal extra-axial fluid  collections, slightly larger on the left, intermediate density. On the left, fluid extends over the posterior temporal region. The greatest depth measured on the left from the inner table to the cortex is approximately 7 mm. On the right the greatest depth estimated is 4 mm. Slight local mass effect of the underlying brain parenchyma. Confluent hypodensity in the left posterior sylvian/parietal region, demonstrated on sequential CT studies dating to 2015. Internal density is somewhat higher than expected for chronic infarction, without contraction. Right frontal approach ventriculostomy again terminating left of midline at the third ventricle. Margin of hypodensity along the tract is again demonstrated with no acute hemorrhage. Focal hypodensity in the left cerebellar hemisphere, unchanged. Coil mass with associated artifact at the basilar tip. Surgical clips of right MCA aneurysms, with associated artifact. Subcortical hypodensity in the left frontal and parietal region, unchanged from prior. No acute intracranial hemorrhage.  No midline shift. Vascular: Atherosclerotic calcifications. Surgical clips of right MCA aneurysm, paraclinoid and at the M 2 distribution. Coil mass at the basilar tip again demonstrated. Skull: Surgical changes of prior right pterional craniotomy. Surgical changes of right frontal approach ventriculostomy with the tip of the shunt drain terminating left of midline in the third ventricle. Sinuses/Orbits: Unremarkable paranasal sinuses. Other: None IMPRESSION: New bilateral frontoparietal subdural collection, mixed density with mild mass effect of the underlying brain. The left a slightly thicker measuring 7 mm, and also extends inferiorly over the temporal lobe. Right-sided collection measures 4 mm. The relatively hypodense region of the left parietal lobe is more dense than expected for an infarction that occurred in 2015, without the expected contraction/evolution of typical infarction  appearance. A low grade glioma should be considered, alternatively local infection and further evaluation with contrast MRI is recommended. Redemonstration of surgical changes of prior right MCA aneurysm clipping x2, basilar tip aneurysm coiling, right frontal approach ventriculostomy drain, as above. Electronically Signed   By: Corrie Mckusick D.O.   On: 11/17/2020 14:44   DG Abd Portable 1V  Result Date: 11/17/2020 CLINICAL DATA:  Constipation and abdominal distension EXAM: PORTABLE ABDOMEN - 1 VIEW COMPARISON:  Abdominal CT from 8 days ago FINDINGS: The bowel gas pattern is normal. No abnormal stool retention. Peritoneal catheter which loops in the pelvis. Extensive atheromatous calcification. Opacified retrocardiac lung with evidence of small right pleural effusion, unchanged from preceding CT. IMPRESSION: Normal bowel gas pattern.  No abnormal stool retention. Electronically Signed   By: Monte Fantasia M.D.   On: 11/17/2020 10:33   VAS [place] LOWER EXTREMITY VENOUS (DVT)  Result Date: 11/17/2020  Lower Venous DVT Study Indications: Covid-19, Age indeterminate DVT found in the right common femoral vein 11/14/20, unable to receive anticoagulants secondary to  ICH.  Limitations: Pain with compression. Comparison Study: Prior study done 11/14/20 is available for comparison Performing Technologist: Sharion Dove RVS  Examination Guidelines: A complete evaluation includes B-mode imaging, spectral Doppler, color Doppler, and power Doppler as needed of all accessible portions of each vessel. Bilateral testing is considered an integral part of a complete examination. Limited examinations for reoccurring indications may be performed as noted. The reflux portion of the exam is performed with the patient in reverse Trendelenburg.  +---------+---------------+---------+-----------+----------+-------------------+ RIGHT    CompressibilityPhasicitySpontaneityPropertiesThrombus Aging       +---------+---------------+---------+-----------+----------+-------------------+ CFV      Full                                         pulsatile flow, age                                                       indeterminate       +---------+---------------+---------+-----------+----------+-------------------+ SFJ      Full                                                             +---------+---------------+---------+-----------+----------+-------------------+ FV Prox                                               patent by color and                                                       Doppler             +---------+---------------+---------+-----------+----------+-------------------+ FV Mid                                                patent by color and                                                       Doppler             +---------+---------------+---------+-----------+----------+-------------------+ FV Distal                                             patent by color and  Doppler             +---------+---------------+---------+-----------+----------+-------------------+ POP                                                   pulsatile flow,                                                           patent by color and                                                       Doppler             +---------+---------------+---------+-----------+----------+-------------------+ PTV      Full                                                             +---------+---------------+---------+-----------+----------+-------------------+ PERO     Full                                                             +---------+---------------+---------+-----------+----------+-------------------+ small, age indeterminate thrombus attached to vein wall   +---------+---------------+---------+-----------+----------+--------------+ LEFT     CompressibilityPhasicitySpontaneityPropertiesThrombus Aging +---------+---------------+---------+-----------+----------+--------------+ CFV      Full                                         pulsatile flow +---------+---------------+---------+-----------+----------+--------------+ SFJ      Full                                                        +---------+---------------+---------+-----------+----------+--------------+ FV Prox  Full                                                        +---------+---------------+---------+-----------+----------+--------------+ FV Mid   Full                                                        +---------+---------------+---------+-----------+----------+--------------+ FV DistalFull                                                        +---------+---------------+---------+-----------+----------+--------------+  PFV      Full                                                        +---------+---------------+---------+-----------+----------+--------------+ POP      Full                                         pulsatile flow +---------+---------------+---------+-----------+----------+--------------+ PTV      Full                                                        +---------+---------------+---------+-----------+----------+--------------+     Summary: RIGHT: - Findings consistent with age indeterminate deep vein thrombosis involving the right common femoral vein. - Findings appear improved from previous examination.  LEFT: - No evidence of deep vein thrombosis in the lower extremity. No indirect evidence of obstruction proximal to the inguinal ligament.  *See table(s) above for measurements and observations. Electronically signed by Jamelle Haring on 11/17/2020 at 2:46:36 PM.    Final     Scheduled Meds: . amiodarone  400 mg Oral BID   . calcitRIOL  1 mcg Oral Q T,Th,Sa-HD  . Chlorhexidine Gluconate Cloth  6 each Topical Q0600  . [START ON 11/24/2020] darbepoetin (ARANESP) injection - NON-DIALYSIS  100 mcg Subcutaneous Q Sat-1800  . levETIRAcetam  500 mg Oral BID  . methimazole  10 mg Oral BID  . midodrine  5 mg Oral BID WC  . pantoprazole  40 mg Oral BID  . polyethylene glycol  17 g Oral BID   Continuous Infusions:  PRN Meds: [DISCONTINUED] acetaminophen **OR** acetaminophen, acetaminophen, sodium chloride flush  Time spent: 35 minutes  Author: Berle Mull, MD [facility] 11/17/2020 4:55 PM  To reach On-call, see care teams to locate the attending and reach out via [email or web address]. Between 7PM-7AM, please contact night-coverage If you still have difficulty reaching the attending provider, please page the [facility] (Director on Call) for [facility] on amion for assistance.

## 2020-11-17 NOTE — Progress Notes (Signed)
VASCULAR LAB    Bilateral lower extremity venous duplex has been performed.  See CV proc for preliminary results.  Messaged results to Dr. Posey Pronto via secure chat   Mauro Kaufmann, [facility], RVT 11/17/2020, 2:22 PM

## 2020-11-17 NOTE — Progress Notes (Addendum)
Pt. has clips in the brain from a prior surgery and there is no info in the chart. RN notified.

## 2020-11-17 NOTE — Progress Notes (Signed)
[facility] Progress Note  Name: [name] MRN: [id number] DOB: [birth year]   Subjective:  Seen in room, no c/o's    Intake/Output Summary (Last 24 hours) at 11/17/2020 1216 Last data filed at 11/17/2020 0900 Gross per 24 hour  Intake 180 ml  Output 0 ml  Net 180 ml    Vitals:  Vitals:   11/17/20 0500 11/17/20 0600 11/17/20 0605 11/17/20 0700  BP:   102/63   Pulse:   76   Resp: (!) 21 (!) 23 19   Temp:   97.8 F (36.6 C) (!) 97.5 F (36.4 C)  TempSrc:   Oral Oral  SpO2:   100% 100%  Weight:        Medications reviewed   Labs:  BMP Latest Ref Rng & Units 11/16/2020 11/15/2020 11/15/2020  Glucose 70 - 99 mg/dL 118(H) 114(H) -  BUN 6 - 20 mg/dL 67(H) 57(H) -  Creatinine 0.44 - 1.00 mg/dL 8.38(H) 7.87(H) -  Sodium 135 - 145 mmol/L 131(L) 134(L) 145  Potassium 3.5 - 5.1 mmol/L 5.2(H) 4.5 3.0(L)  Chloride 98 - 111 mmol/L 94(L) 96(L) -  CO2 22 - 32 mmol/L 17(L) 17(L) -  Calcium 8.9 - 10.3 mg/dL 9.6 9.9 -     Physical Exam:     alert, nad , chron ill appearing  no jvd  Chest cta bilat  Cor reg no RG  Abd soft ntnd no ascites   Ext no LE edema   Alert, NF    R AVF +bruit  OP HD: TTS [facility]   3h  38kg  2/2 bath  TDC  Hep none  - calc 120 ug tiw  - mircera 75 q2 last 08/23/20  - velphoro 2 ac tid   - last HD 3hrs on 1/18, mostly compliant.  - missed VVS post OP f/u - sp plication on [id number]   CXR 1/21 - FINDINGS: Central catheter tip is at the cavoatrial junction. There is ill-defined airspace opacity in the left lower lobe. Lungs elsewhere clear.   Assessment/Plan:  1. COVID -19 viral pna - hazy opacities on CXR. SP remdesivir. On IV cefepime. Will come out of isolation on 1/31 (10days). On room air.  2. Acute on chronic anemia - Hb 9.7 >> 11.5 after 2u prbc's on 1/21. gave aranesp 100 mcg on 1/22. GI consulted, conservative measures recommended. Not a/c candidate.  3. ESRD - HD TTS.  Sp HD here Sunday and Tuesday. Next HD today off schedule. No HD  tomorrow, next HD then will be Monday.  4. AMS - appreciate efforts of primary team. Not sure but may have memory loss at baseline.  Patient has very poor memory here, there surely is some chronicity to this.  5. L wrist fracture - sp ORIF 1/27 by ortho 6. Volume - no vol excess on exam. CXR no edema, LLL infiltrate. Wt's not correct.  7. Parox afib w/ RVR - on IV amio to cont through pt's orthopedic surgery today.  fib  8. Metabolic bone disease - binders when taking PO. Continue calcitriol     Sol Blazing, MD 11/17/2020 12:16 PM

## 2020-11-18 DIAGNOSIS — I4891 Unspecified atrial fibrillation: Secondary | ICD-10-CM | POA: Diagnosis not present

## 2020-11-18 MED ORDER — MIDODRINE HCL 5 MG PO TABS
10.0000 mg | ORAL_TABLET | Freq: Two times a day (BID) | ORAL | Status: DC
  Administered 2020-11-18 – 2020-11-27 (×13): 10 mg via ORAL
  Filled 2020-11-18 (×15): qty 2

## 2020-11-18 MED ORDER — ALBUMIN HUMAN 5 % IV SOLN
12.5000 g | Freq: Once | INTRAVENOUS | Status: AC
  Administered 2020-11-18: 12.5 g via INTRAVENOUS
  Filled 2020-11-18: qty 250

## 2020-11-18 NOTE — Progress Notes (Signed)
   11/18/20 [id number]  Assess: MEWS Score  Temp 98 F (36.7 C)  BP (!) 73/45  ECG Heart Rate (!) 115  Resp (!) 27  SpO2 99 %  O2 Device Room Air  Assess: MEWS Score  MEWS Temp 0  MEWS Systolic 2  MEWS Pulse 2  MEWS RR 2  MEWS LOC 0  MEWS Score 6  MEWS Score Color Red  Assess: if the MEWS score is Yellow or Red  Were vital signs taken at a resting state? Yes  Focused Assessment No change from prior assessment  Early Detection of Sepsis Score *See Row Information* Low  MEWS guidelines implemented *See Row Information* Yes  Take Vital Signs  Increase Vital Sign Frequency  Red: Q 1hr X 4 then Q 4hr X 4, if remains red, continue Q 4hrs  Escalate  MEWS: Escalate Red: discuss with charge nurse/RN and provider, consider discussing with RRT  Notify: Charge Nurse/RN  Name of Charge Nurse/RN Notified Fred RN  Date Charge Nurse/RN Notified 11/18/20  Time Charge Nurse/RN Notified [id number]  Notify: Provider  Provider Name/Title Shalhoub  Date Provider Notified 11/18/20  Time Provider Notified 0600  Notification Type Page  Notification Reason Other (Comment) (hypotensive)  Response No new orders  Date of Provider Response 11/18/20  Time of Provider Response 0630   Spoke at length with Dr Cyd Silence regarding patient being hypotensive.  Patient is arousable and shows no change to mentation, answering questions appropriately.   Additionally is afebrile with normal respirations.  Agree with MD that hypotension very likely due to dialysis treatment earlier in shift and pressure is about 10-15 points below baseline.  Will continue to closely monitor and notify of any further changes in BP or mental status.  Plan is to give midodrine early once patient is more awake and continue close monitoring of VS, with bolus if no improvement.

## 2020-11-18 NOTE — Progress Notes (Signed)
HOSPITAL MEDICINE OVERNIGHT EVENT NOTE    Notified by nursing that since patient has returned from dialysis shortly after midnight she has been exhibiting extremely low blood pressures.  Chart reviewed, systolic blood pressure since dialysis have routinely been in the 80s with one blood pressure being as low as 73/45.  Nursing states patient is awake alert and oriented x3 and follows all commands.  Patient is essentially asymptomatic in relation to this.  It turns out that nursing is only able to obtain blood pressures from the calf which can be quite inaccurate.  Nursing reports that clinically patient is asymptomatic.  Blood pressures in this patient seemed to run low even at baseline with average systolic blood pressures in the 80s-90's max.  Current low blood pressures are likely the result of recent dialysis that is completed after midnight.  I have advised nursing to continue to monitor the patient closely.  If the patient remains asymptomatic we will simply manage conservatively.  If the patient does become more lethargic or lightheaded will administer small boluses and reassess.    Diane Emerald  MD [facility]

## 2020-11-18 NOTE — Progress Notes (Signed)
[facility] Progress Note  Patient: [name]    [id number]  DOA: 11/08/2020     Date of Service: the patient was seen and examined on 11/18/2020  Brief hospital course: Past medical history of ESRD on HD TTS, CAD, HTN, HLD, paroxysmal A. fib not on any anticoagulation secondary to [place], seizure disorder, CVA in 2015.  Presents with complaints of nausea vomiting and epigastric pain.  Found to have COVID-19 pneumonia incidentally along with A. fib with RVR and also acute on chronic anemia.  Currently plan is monitor for improvement in mentation as well as subdural collection  Assessment and Plan: 1. Acute COVID-19 Viral Pneumonia CXR: hazy bilateral peripheral opacities Oxygen requirement: On room air CRP: Trending up, could be postoperative, monitor Remdesivir: Completed on 1/25 Steroids: Not indicated given lack of hypoxia Baricitinib/Actemra: Not indicated given concern with bacterial infection Antibiotics: Treated with IV cefepime for 5 days DVT Prophylaxis: Place and maintain sequential compression device Start: 11/09/20 1030  Prone positioning and incentive spirometer use recommended.  Overall plan: From Covid point of view the patient remained stable Isolation duration: First positive test date 11/08/2020, can come off of the isolation on end of 10 days.  Day 0 is 11/08/2020.  Can be out of isolation on 11/19/2020  The treatment plan and use of medications and known side effects were discussed with patient/family. It was clearly explained that complete risks and long-term side effects are unknown. Patient/family agree with the treatment plan.    2. Acute on chronic anemia. 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 Epigastric pain Constipation-resolved On presentation hemoglobin dropped down significantly from baseline of 11-6.8. No evidence of acute bleeding or hemolysis.  No schistocytes seen on the smear. Lipase mildly elevated. CT  abdomen shows no acute abnormality. X-ray abdomen also unremarkable. GI was consulted who recommended continued conservative measures, no intervention and currently signed off. Lipase elevation likely secondary to severe constipation currently resolved.. Not a candidate for any anticoagulation given her ongoing anemia. Continue PPI. Transfuse for hemoglobin less than 7 or hemodynamic instability.  3.  Paroxysmal A. fib with RVR Not on any anticoagulation given history of ICH. Cardiology was consulted Currently signed off. Recommend to continue p.o. amiodarone. Currently heart rate mildly elevated suspect this is in the setting of hypotension. Continue to monitor. May need to [facility] cardiology if RVR worsens.  4.  ESRD on HD TTS Postdialysis hypotension Nephrology consulted. Appreciate assistance.  HD frequency per nephrology. Receiving Aranesp, calcitriol. Blood pressure dropped significantly after HD on 1/29.  Patient was given albumin with improvement in blood pressure.  5.  Right CFV DVT Ultrasound lower extremity positive for DVT, nonocclusive, age indeterminant on right leg. Patient not a candidate for anticoagulation given her ongoing anemia as well as prior history of ICH. Discussed with daughter and recommended IVC filter. Discussed with IR recommend currently holding off on intervention.  Patient remains at risk for progression of the DVT given her immobility secondary to her critical condition. D-dimer also trending up. Repeat Doppler on 1/29 shows prior clot appears to have mostly resolved. no propagation is seen.   6.  Left wrist fracture Underwent surgery.  Tolerated well.  Will monitor postop recovery.  7.  Underweight. Likely severe protein calorie malnutrition. Placing the patient at risk for poor outcome Will consult dietary Body mass index is 19.74 kg/m.   8.  Acute metabolic encephalopathy. History of VP shunt for obstructive hydrocephalus  2013 Bilateral subdural fluid collection  with with some mass-effect. 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 No single etiology. CT scan was performed which shows subdural collection. Appreciate neurosurgery consultation although do not think that this is symptomatic in nature. Also has an ongoing chronic appearing hypodensity in the left parietal lobe which appears to be progressing and likely concerning for glioma or infection like lesion.  MRI is recommended with patient's aneurysm clips are MRI incompatible. We will repeat another CT scan on 1/31.  9.  Goals of care. Currently patient is very frail.  Has a long hospital stay with multiple comorbidities including COVID-19 pneumonia, acute on chronic anemia, acute metabolic encephalopathy with subdural fluid collection as well as worsening of left parietal hypodensity with minimal oral intake, A. fib with RVR, left wrist fracture requiring surgery and recurrent hypotension. Patient's prognosis is very poor in the setting of poor p.o. intake with all these comorbidities and mental status changes. We will discuss with daughter regarding goals of care and palliative care will be consulted.  Diet: Regular diet DVT Prophylaxis:   Place and maintain sequential compression device Start: 11/09/20 1030  Advance goals of care discussion: Full code  Family Communication: no family was present at bedside, at the time of interview.  Discussed with daughter on the phone on 11/17/2020  Disposition:  Status is: Inpatient  Remains inpatient appropriate because:IV treatments appropriate due to intensity of illness or inability to take PO and Inpatient level of care appropriate due to severity of illness  Dispo: The patient is from: Home              Anticipated d/c is to: SNF              Anticipated d/c date is: > 3 days              Patient currently is not medically stable to d/c.   Difficult to place patient  No  Subjective: No nausea no vomiting.  No fever no chills.  Blood pressure was low last night.  Reports some anxiety.  Her VP shunt was adjusted by neurosurgery.  Physical Exam:  General: Appear in mild distress, no Rash; Oral Mucosa Clear, moist. no Abnormal Neck Mass Or lumps, Conjunctiva normal  Cardiovascular: S1 and S2 Present, no Murmur, Respiratory: good respiratory effort, Bilateral Air entry present and CTA, no Crackles, no wheezes Abdomen: Bowel Sound present, Soft and no tenderness Extremities: no Pedal edema Neurology: alert and oriented only to place and person  affect appropriate. no new focal deficit Gait not checked due to patient safety concerns    Vitals:   11/18/20 1611 11/18/20 1700 11/18/20 1800 11/18/20 1837  BP: 97/71 (!) 94/58 (!) 107/43 (!) 107/43  Pulse: (!) 105 (!) 105 (!) 105 (!) 105  Resp: '20 20 20 20  '$ Temp: 98 F (36.7 C) 98 F (36.7 C) 98 F (36.7 C) 98 F (36.7 C)  TempSrc:  Oral Oral Oral  SpO2:  99% 99% 99%  Weight:        Intake/Output Summary (Last 24 hours) at 11/18/2020 1952 Last data filed at 11/18/2020 1600 Gross per 24 hour  Intake 360 ml  Output 649 ml  Net -289 ml   Filed Weights   11/12/20 0515 11/16/20 2200 11/18/20 0343  Weight: 43.5 kg 49.3 kg 47.4 kg    Data Reviewed: I have personally reviewed and interpreted daily labs, tele strips, imaging. I reviewed all nursing notes, pharmacy notes, vitals, pertinent old records I have discussed  plan of care as described above with RN and patient/family.  CBC: Recent Labs  Lab 11/11/20 2110 11/13/20 1027 11/15/20 1413 11/15/20 2033 11/16/20 1902  WBC 6.5 7.7  --  8.6 11.3*  NEUTROABS 5.4 6.3  --  7.0 9.5*  HGB 13.0 11.5* 9.2* 11.3* 12.0  HCT 38.5 32.3* 27.0* 32.6* 33.2*  MCV 82.3 82.2  --  80.5 78.5*  PLT 387 355  --  326 [id number]   Basic Metabolic Panel: Recent Labs  Lab 11/11/20 2110 11/12/20 0742 11/13/20 1027 11/15/20 1413 11/15/20 2033 11/16/20 1902  NA 132*  135 133* 145 134* 131*  K 3.3* 3.6 4.2 3.0* 4.5 5.2*  CL 94* 95* 94*  --  96* 94*  CO2 21* 23 18*  --  17* 17*  GLUCOSE 129* 108* 83  --  114* 118*  BUN 41* 53* 74*  --  57* 67*  CREATININE 6.48* 7.47* 9.40*  --  7.87* 8.38*  CALCIUM 8.6* 9.2 8.9  --  9.9 9.6  MG 2.0 2.1 2.2  --  2.1 2.3    Studies: No results found.  Scheduled Meds: . amiodarone  400 mg Oral BID  . calcitRIOL  1 mcg Oral Q T,Th,Sa-HD  . Chlorhexidine Gluconate Cloth  6 each Topical Q0600  . [START ON 11/24/2020] darbepoetin (ARANESP) injection - NON-DIALYSIS  100 mcg Subcutaneous Q Sat-1800  . levETIRAcetam  500 mg Oral BID  . methimazole  10 mg Oral BID  . midodrine  10 mg Oral BID WC  . pantoprazole  40 mg Oral BID  . polyethylene glycol  17 g Oral BID   Continuous Infusions:  PRN Meds: [DISCONTINUED] acetaminophen **OR** acetaminophen, acetaminophen, sodium chloride flush  Time spent: 35 minutes  Author: Berle Mull, MD [facility] 11/18/2020 7:52 PM  To reach On-call, see care teams to locate the attending and reach out via [email or web address]. Between 7PM-7AM, please contact night-coverage If you still have difficulty reaching the attending provider, please page the [facility] (Director on Call) for [facility] on amion for assistance.

## 2020-11-18 NOTE — Progress Notes (Addendum)
   11/18/20 1837  Assess: MEWS Score  Temp 98 F (36.7 C)  BP (!) 107/43  Pulse Rate (!) 105  ECG Heart Rate (!) 105  Resp 20  Level of Consciousness Alert  SpO2 99 %  O2 Device Room Air  Assess: MEWS Score  MEWS Temp 0  MEWS Systolic 0  MEWS Pulse 1  MEWS RR 0  MEWS LOC 0  MEWS Score 1  MEWS Score Color Green  Assess: if the MEWS score is Yellow or Red  Were vital signs taken at a resting state? Yes  Focused Assessment No change from prior assessment  Early Detection of Sepsis Score *See Row Information* Low  MEWS guidelines implemented *See Row Information* Yes  Treat  Pain Scale 0-10  Pain Score 0  Document  Patient Outcome Stabilized after interventions  Progress note created (see row info) Yes   Patient had RED MEWS at beginning of shift for tachycardia (HR 110-120's) and hypotension (SBP 60-70).  Attending provider placed orders for albumin with vital signs improving trending towards mostly yellow MEWS with some values in the green MEWS range. Will continue to monitor an update provider accordingly.

## 2020-11-18 NOTE — Progress Notes (Signed)
[facility] Progress Note  Name: Shayla Noguera MRN: [id number] DOB: January 31, 1961   Subjective:  Seen in room, no c/o's    Intake/Output Summary (Last 24 hours) at 11/18/2020 2023 Last data filed at 11/18/2020 1600 Gross per 24 hour  Intake 360 ml  Output 649 ml  Net -289 ml    Vitals:  Vitals:   11/18/20 1611 11/18/20 1700 11/18/20 1800 11/18/20 1837  BP: 97/71 (!) 94/58 (!) 107/43 (!) 107/43  Pulse: (!) 105 (!) 105 (!) 105 (!) 105  Resp: '20 20 20 20  '$ Temp: 98 F (36.7 C) 98 F (36.7 C) 98 F (36.7 C) 98 F (36.7 C)  TempSrc:  Oral Oral Oral  SpO2:  99% 99% 99%  Weight:        Medications reviewed   Labs:  BMP Latest Ref Rng & Units 11/16/2020 11/15/2020 11/15/2020  Glucose 70 - 99 mg/dL 118(H) 114(H) -  BUN 6 - 20 mg/dL 67(H) 57(H) -  Creatinine 0.44 - 1.00 mg/dL 8.38(H) 7.87(H) -  Sodium 135 - 145 mmol/L 131(L) 134(L) 145  Potassium 3.5 - 5.1 mmol/L 5.2(H) 4.5 3.0(L)  Chloride 98 - 111 mmol/L 94(L) 96(L) -  CO2 22 - 32 mmol/L 17(L) 17(L) -  Calcium 8.9 - 10.3 mg/dL 9.6 9.9 -     Physical Exam:     alert, nad , chron ill appearing  no jvd  Chest cta bilat  Cor reg no RG  Abd soft ntnd no ascites   Ext no LE edema   Alert, NF    R AVF +bruit  OP HD: TTS [facility]   3h  38kg  2/2 bath  TDC  Hep none  - calc 120 ug tiw  - mircera 75 q2 last 08/23/20  - velphoro 2 ac tid   - last HD 3hrs on 1/18, mostly compliant.  - missed VVS post OP f/u - sp plication on [id number]   CXR 1/21 - FINDINGS: Central catheter tip is at the cavoatrial junction. There is ill-defined airspace opacity in the left lower lobe. Lungs elsewhere clear.   Assessment/Plan:  1. COVID -19 viral pna - hazy opacities on CXR. SP remdesivir. On IV cefepime. Will come out of isolation on 1/31 (10days). On room air.  2. Acute on chronic anemia - Hb 9.7 >> 11.5 after 2u prbc's on 1/21. gave aranesp 100 mcg on 1/22. GI consulted, conservative measures recommended. Not a/c candidate.  3. DVT  RLE - not candidate for a/c due to hx of ICH and ongoing anemia 4. Parox afib w/ RVR - no a/c, as above, IV amio now is po amio. Cards following 5. 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 Per primary team.  6. ESRD - HD TTS.  Had HD here Sun/ Tues / Friday this week. Off schedule.  Creat 7-9, plan HD Monday.  7. AMS - appreciate efforts of primary team.  8. L wrist fracture - sp ORIF 1/27 by ortho 9. Volume - no vol excess on exam. CXR no edema, LLL infiltrate. Wt's prob not correct.  10. Metabolic bone disease - binders when taking PO. Continue calcitriol 11. GOC - very frail, multiple complicating medical problems. Per primary team consulting pall care team.      Sol Blazing, MD 11/18/2020 8:23 PM

## 2020-11-19 ENCOUNTER — Inpatient Hospital Stay (HOSPITAL_COMMUNITY): Payer: 59

## 2020-11-19 DIAGNOSIS — I4891 Unspecified atrial fibrillation: Secondary | ICD-10-CM | POA: Diagnosis not present

## 2020-11-19 MED ORDER — NEPRO/CARBSTEADY PO LIQD
237.0000 mL | Freq: Three times a day (TID) | ORAL | Status: DC
  Administered 2020-11-19 – 2020-11-27 (×17): 237 mL via ORAL

## 2020-11-19 MED ORDER — ROSUVASTATIN CALCIUM 5 MG PO TABS
10.0000 mg | ORAL_TABLET | Freq: Every day | ORAL | Status: DC
  Administered 2020-11-20 – 2020-11-27 (×8): 10 mg via ORAL
  Filled 2020-11-19 (×8): qty 2

## 2020-11-19 MED ORDER — RENA-VITE PO TABS
1.0000 | ORAL_TABLET | Freq: Every day | ORAL | Status: DC
  Administered 2020-11-19 – 2020-11-26 (×8): 1 via ORAL
  Filled 2020-11-19 (×8): qty 1

## 2020-11-19 MED ORDER — HEPARIN SODIUM (PORCINE) 1000 UNIT/ML IJ SOLN
INTRAMUSCULAR | Status: AC
  Administered 2020-11-19: 1000 [IU]
  Filled 2020-11-19: qty 4

## 2020-11-19 NOTE — Procedures (Signed)
I was present at this dialysis session. I have reviewed the session itself and made appropriate changes.   Vital signs in last 24 hours:  Temp:  [98 F (36.7 C)-98.9 F (37.2 C)] 98.1 F (36.7 C) (01/31 0730) Pulse Rate:  [100-109] 100 (01/31 0500) Resp:  [20-22] 20 (01/31 0500) BP: (85-116)/(43-88) 99/84 (01/31 0500) SpO2:  [89 %-99 %] 98 % (01/31 0500) Weight:  [47.7 kg] 47.7 kg (01/31 0330) Weight change: 0.3 kg Filed Weights   11/16/20 2200 11/18/20 0343 11/19/20 0330  Weight: 49.3 kg 47.4 kg 47.7 kg    Recent Labs  Lab 11/16/20 1902  NA 131*  K 5.2*  CL 94*  CO2 17*  GLUCOSE 118*  BUN 67*  CREATININE 8.38*  CALCIUM 9.6    Recent Labs  Lab 11/13/20 1027 11/15/20 1413 11/15/20 2033 11/16/20 1902  WBC 7.7  --  8.6 11.3*  NEUTROABS 6.3  --  7.0 9.5*  HGB 11.5* 9.2* 11.3* 12.0  HCT 32.3* 27.0* 32.6* 33.2*  MCV 82.2  --  80.5 78.5*  PLT 355  --  326 292    Scheduled Meds: . amiodarone  400 mg Oral BID  . calcitRIOL  1 mcg Oral Q T,Th,Sa-HD  . Chlorhexidine Gluconate Cloth  6 each Topical Q0600  . [START ON 11/24/2020] darbepoetin (ARANESP) injection - NON-DIALYSIS  100 mcg Subcutaneous Q Sat-1800  . levETIRAcetam  500 mg Oral BID  . methimazole  10 mg Oral BID  . midodrine  10 mg Oral BID WC  . pantoprazole  40 mg Oral BID  . polyethylene glycol  17 g Oral BID   Continuous Infusions: PRN Meds:.[DISCONTINUED] acetaminophen **OR** acetaminophen, acetaminophen, sodium chloride flush    OP HD: TTS [facility]   3h  38kg  2/2 bath  TDC  Hep none  - calc 120 ug tiw  - mircera 75 q2 last 08/23/20  - velphoro 2 ac tid   - last HD 3hrs on 1/18, mostly compliant.  - missed VVS post OP f/u - sp plication on [id number]   CXR 1/21 - FINDINGS: Central catheter tip is at the cavoatrial junction. There is ill-defined airspace opacity in the left lower lobe. Lungs elsewhere clear.   Assessment/Plan:  1. COVID -19 viral pna - hazy opacities on CXR. SP remdesivir. On IV  cefepime. Is now out of isolation on 1/31 (10days). On room air.  2. Acute on chronic anemia - Hb 9.7 >> 11.5 after 2u prbc's on 1/21. gavearanesp 100 mcg on 1/22. GI consulted, conservative measures recommended. Not a/c candidate.  3. 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 Parox afib w/ RVR - no a/c, as above, IV amio now is po amio. Cards following 5. 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 Per primary team.  6. ESRD - HD TTS.  Had HD here Sun/ Tues / Friday this week. Off schedule.  Creat 7-9, plan HD today and then back on schedule wed or thurs depending upon census. 7. AMS - appreciate efforts of primary team.  8. L wrist fracture - sp ORIF 1/27 by ortho 9. Volume - no vol excess on exam. CXR no edema, LLL infiltrate. Wt's prob not correct.  10. Metabolic bone disease - binders when taking PO. Continue calcitriol 11. GOC - very frail, multiple complicating medical problems. Per primary team consulting pall  care team.    Donetta Potts,  MD 11/19/2020, 9:16 AM

## 2020-11-19 NOTE — Progress Notes (Signed)
Physical Therapy Treatment Patient Details Name: [name] MRN: [id number] DOB: 1961/08/10 Today's Date: 11/19/2020    History of Present Illness 59 y.o. female with medical history significant of ESRD on HD TTS, CAD, hypertension, hyperlipidemia, paroxysmal A. 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 Found to be in A-fib with RVR, unvaccinated, COVID+, CT showing small L pleural effusion at the L lung base. Reports L wrist pain from daughter falling on her wrist prior to coming to ED found to have fx and  L UE wrist splint applied in ED. NWB will eventually require ORIF. Rapid response 11/09/20 transfer to ICU, placement of untunneled R femoral central venous catheter, D/c'd 11/11/20    PT Comments    Pt was seen for mobiltiy and basically stopped in mid-transfer to side of bed.  Her reluctance was with PT moving LUE, but is able to do ROM to L shoulder separately with LUE.  Pt is unwilling to get up to the chair, and so will encourage SNF discharge since pt is out of a more independent mobility skill set to get home.  See for these goals as well as other therapy goals on her chart.    Follow Up Recommendations  SNF     Equipment Recommendations  3in1 (PT)    Recommendations for Other Services OT consult     Precautions / Restrictions Precautions Precautions: Fall Precaution Comments: NWB on LUE Required Braces or Orthoses: Splint/Cast Splint/Cast: L wrist volar splint Restrictions Weight Bearing Restrictions: Yes LUE Weight Bearing: Non weight bearing    Mobility  Bed Mobility Overal bed mobility: Needs Assistance Bed Mobility: Supine to Sit;Rolling Rolling: Min assist   Supine to sit: Mod assist     General bed mobility comments: attempted to sit on side of bed and pt immediately  stopped progress every time.  Transfers Overall transfer level: Needs assistance               General transfer comment: declined  Ambulation/Gait                 Stairs             Wheelchair Mobility    Modified Rankin (Stroke Patients Only)       Balance                                            Cognition Arousal/Alertness: Awake/alert Behavior During Therapy: Flat affect Overall Cognitive Status: Impaired/Different from baseline Area of Impairment: Awareness;Safety/judgement;Following commands;Memory;Attention;Orientation;Problem solving                 Orientation Level: Situation;Time Current Attention Level: Selective Memory: Decreased recall of precautions;Decreased short-term memory Following Commands: Follows one step commands inconsistently;Follows one step commands with increased time Safety/Judgement: Decreased awareness of safety;Decreased awareness of deficits Awareness: Intellectual Problem Solving: Slow processing;Difficulty sequencing;Requires tactile cues;Requires verbal cues General Comments: will not let PT touch LUE      Exercises General Exercises - Lower Extremity Ankle Circles/Pumps: AAROM;5 reps Quad Sets: AAROM;10 reps Gluteal Sets: AAROM;10 reps Heel Slides: AAROM;10 reps Hip ABduction/ADduction: AAROM;10 reps    General Comments General comments (skin integrity, edema, etc.): Pt was seen for mobility and was finally unable to get  her sitting posture on side of bed, refused after assist only a short trip      Pertinent Vitals/Pain Pain Assessment: Faces Faces Pain Scale: Hurts even more Pain Location: movement on LUE, pt excessively guarding it Pain Descriptors / Indicators: Grimacing Pain Intervention(s): Limited activity within patient's tolerance;Repositioned    Home Living                      Prior Function            PT Goals (current goals can now be found in the  care plan section) Acute Rehab PT Goals Patient Stated Goal: get pain meds Progress towards PT goals: Not progressing toward goals - comment    Frequency    Min 2X/week      PT Plan Current plan remains appropriate    Co-evaluation              AM-PAC PT "6 Clicks" Mobility   Outcome Measure  Help needed turning from your back to your side while in a flat bed without using bedrails?: A Little Help needed moving from lying on your back to sitting on the side of a flat bed without using bedrails?: A Little Help needed moving to and from a bed to a chair (including a wheelchair)?: A Lot Help needed standing up from a chair using your arms (e.g., wheelchair or bedside chair)?: A Lot Help needed to walk in hospital room?: A Lot Help needed climbing 3-5 steps with a railing? : A Lot 6 Click Score: 14    End of Session Equipment Utilized During Treatment: Gait belt Activity Tolerance: Patient limited by pain Patient left: in bed;with call bell/phone within reach;with bed alarm set Nurse Communication: Mobility status PT Visit Diagnosis: Muscle weakness (generalized) (M62.81);Other abnormalities of gait and mobility (R26.89);Pain;Difficulty in walking, not elsewhere classified (R26.2) Pain - Right/Left: Left Pain - part of body: Hand;Arm     Time: 1451-1515 PT Time Calculation (min) (ACUTE ONLY): 24 min  Charges:  $Therapeutic Exercise: 8-22 mins $Therapeutic Activity: 8-22 mins            Ramond Dial 11/19/2020, 6:06 PM Mee Hives, PT MS Acute Rehab Dept. Number: [place] [facility] [place]

## 2020-11-19 NOTE — TOC Progression Note (Signed)
Transition of Care [facility]) - Progression Note    Patient Details  Name: Quintana Peshlakai MRN: [id number] Date of Birth: 03-10-1961  [facility] CM/SW Contact  Zenon Mayo, RN Phone Number: 11/19/2020, 8:14 PM  Clinical Narrative:    Per CSW previous note, patient daughter wants patient to go home, she is active with [facility] will need to add some additional services.  Palliative also tried to reach daughter today to discuss [place], she was unable to contact her today. TOC team will continue to follow for dc needs.   Expected Discharge Plan: Skilled Nursing Facility Barriers to Discharge: Continued Medical Work up  Expected Discharge Plan and Services Expected Discharge Plan: [place]                                               Social Determinants of Health (SDOH) Interventions    Readmission Risk Interventions No flowsheet data found.

## 2020-11-19 NOTE — Progress Notes (Signed)
[facility] Progress Note  Patient: [name]    [id number]  DOA: 11/08/2020     Date of Service: the patient was seen and examined on 11/19/2020  Brief hospital course: Past medical history of ESRD on HD TTS, CAD, HTN, HLD, paroxysmal A. fib not on any anticoagulation secondary to [place], seizure disorder, CVA in 2015.  Presents with complaints of nausea vomiting and epigastric pain.  Found to have COVID-19 pneumonia incidentally along with A. fib with RVR and also acute on chronic anemia.  Currently plan is monitor for improvement  oral intake  Assessment and Plan: 1. Acute COVID-19 Viral Pneumonia CXR: hazy bilateral peripheral opacities Oxygen requirement: On room air CRP: Trending up, could be postoperative, monitor Remdesivir: Completed on 1/25 Steroids: Not indicated given lack of hypoxia Baricitinib/Actemra: Not indicated given concern with bacterial infection Antibiotics: Treated with IV cefepime for 5 days DVT Prophylaxis: Place and maintain sequential compression device Start: 11/09/20 1030  Prone positioning and incentive spirometer use recommended.  Overall plan: From Covid point of view the patient remained stable Isolation duration: First positive test date 11/08/2020, can come off of the isolation on end of 10 days.  Day 0 is 11/08/2020.  Can be out of isolation on 11/19/2020  The treatment plan and use of medications and known side effects were discussed with patient/family. It was clearly explained that complete risks and long-term side effects are unknown. Patient/family agree with the treatment plan.    2. Acute on chronic anemia. 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 Epigastric pain Constipation-resolved On presentation hemoglobin dropped down significantly from baseline of 11-6.8. No evidence of acute bleeding or hemolysis.  No schistocytes seen on the smear. Lipase mildly elevated. CT abdomen shows no acute  abnormality. X-ray abdomen also unremarkable. GI was consulted who recommended continued conservative measures, no intervention and currently signed off. Lipase elevation likely secondary to severe constipation currently resolved.. Not a candidate for any anticoagulation given her ongoing anemia. Continue PPI. Transfuse for hemoglobin less than 7 or hemodynamic instability.  3.  Paroxysmal A. fib with RVR Not on any anticoagulation given history of ICH. Cardiology was consulted Currently signed off. Recommend to continue p.o. amiodarone. Currently heart rate mildly elevated suspect this is in the setting of hypotension. Continue to monitor. May need to [facility] cardiology if RVR worsens.  4.  ESRD on HD TTS Postdialysis hypotension Nephrology consulted. Appreciate assistance.  HD frequency per nephrology. Receiving Aranesp, calcitriol. Blood pressure dropped significantly after HD on 1/29.  Patient was given albumin with improvement in blood pressure.  5.  Right CFV DVT Ultrasound lower extremity positive for DVT, nonocclusive, age indeterminant on right leg. Patient not a candidate for anticoagulation given her ongoing anemia as well as prior history of ICH. Discussed with daughter and recommended IVC filter. Discussed with IR recommend currently holding off on intervention.  Patient remains at risk for progression of the DVT given her immobility secondary to her critical condition. D-dimer also trending up. Repeat Doppler on 1/29 shows prior clot appears to have mostly resolved. no propagation is seen.   6.  Left wrist fracture Underwent surgery.  Tolerated well.  Will monitor postop recovery.  7.  Underweight. Likely severe protein calorie malnutrition. Placing the patient at risk for poor outcome Will consult dietary Body mass index is 21.24 kg/m.   8.  Acute metabolic encephalopathy. History of VP shunt for obstructive hydrocephalus 2013 Bilateral subdural fluid  collection with with some mass-effect.  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 No single etiology. CT scan was performed which shows subdural collection. Appreciate neurosurgery consultation although do not think that this is symptomatic in nature. Also has an ongoing chronic appearing hypodensity in the left parietal lobe which appears to be progressing and likely concerning for glioma or infection like lesion.  MRI is recommended with patient's aneurysm clips are MRI incompatible. Repeat CT scan on 1/31 shows no change in subdural hematoma. Neurosurgery recommends no further work-up or treatment.  9.  Goals of care. Currently patient is very frail.  Has a long hospital stay with multiple comorbidities including COVID-19 pneumonia, acute on chronic anemia, acute metabolic encephalopathy with subdural fluid collection as well as worsening of left parietal hypodensity with minimal oral intake, A. fib with RVR, left wrist fracture requiring surgery and recurrent hypotension. Patient's prognosis is very poor in the setting of poor p.o. intake with all these comorbidities and mental status changes. We will discuss with daughter regarding goals of care and palliative care will be consulted.  Diet: Regular diet DVT Prophylaxis:   Place and maintain sequential compression device Start: 11/09/20 1030  Advance goals of care discussion: Full code  Family Communication: no family was present at bedside, at the time of interview.  Discussed with daughter on the phone on 11/17/2020  Disposition:  Status is: Inpatient  Remains inpatient appropriate because:IV treatments appropriate due to intensity of illness or inability to take PO and Inpatient level of care appropriate due to severity of illness  Dispo: The patient is from: Home              Anticipated d/c is to: SNF              Anticipated d/c date is: > 3 days              Patient currently is not medically  stable to d/c.   Difficult to place patient No  Subjective: No nausea no vomiting.  No fever no chills.  Seen at hemodialysis.  Tolerating very well.  Physical Exam:  General: Appear in mild distress, no Rash; Oral Mucosa Clear, moist. no Abnormal Neck Mass Or lumps, Conjunctiva normal  Cardiovascular: S1 and S2 Present, no Murmur, Respiratory: good respiratory effort, Bilateral Air entry present and CTA, no Crackles, no wheezes Abdomen: Bowel Sound present, Soft and no tenderness Extremities: no Pedal edema Neurology: alert and oriented to person affect appropriate. no new focal deficit Gait not checked due to patient safety concerns  Vitals:   11/19/20 1030 11/19/20 1100 11/19/20 1147 11/19/20 1939  BP: 94/71 (!) 84/56 (!) 95/55 100/65  Pulse: (!) 105 (!) 126 (!) 106 75  Resp: (!) 27 (!) 24 (!) 25 18  Temp:   (!) 97.5 F (36.4 C) 98.1 F (36.7 C)  TempSrc:   Oral Oral  SpO2: 94% 100% 100% 98%  Weight:        Intake/Output Summary (Last 24 hours) at 11/19/2020 2135 Last data filed at 11/19/2020 1338 Gross per 24 hour  Intake 360 ml  Output 1000 ml  Net -640 ml   Filed Weights   11/18/20 0343 11/19/20 0330 11/19/20 0912  Weight: 47.4 kg 47.7 kg 51 kg    Data Reviewed: I have personally reviewed and interpreted daily labs, tele strips, imaging. I reviewed all nursing notes, pharmacy notes, vitals, pertinent old records I have discussed plan of care as described above with RN and patient/family.  CBC: Recent Labs  Lab 11/13/20 1027 11/15/20 1413 11/15/20 2033 11/16/20 1902  WBC 7.7  --  8.6 11.3*  NEUTROABS 6.3  --  7.0 9.5*  HGB 11.5* 9.2* 11.3* 12.0  HCT 32.3* 27.0* 32.6* 33.2*  MCV 82.2  --  80.5 78.5*  PLT 355  --  326 [id number]   Basic Metabolic Panel: Recent Labs  Lab 11/13/20 1027 11/15/20 1413 11/15/20 2033 11/16/20 1902  NA 133* 145 134* 131*  K 4.2 3.0* 4.5 5.2*  CL 94*  --  96* 94*  CO2 18*  --  17* 17*  GLUCOSE 83  --  114* 118*  BUN 74*  --   57* 67*  CREATININE 9.40*  --  7.87* 8.38*  CALCIUM 8.9  --  9.9 9.6  MG 2.2  --  2.1 2.3    Studies: CT HEAD WO CONTRAST  Result Date: 11/19/2020 CLINICAL DATA:  Subdural hematoma, shunted hydrocephalus, follow-up EXAM: CT HEAD WITHOUT CONTRAST TECHNIQUE: Contiguous axial images were obtained from the base of the skull through the vertex without intravenous contrast. COMPARISON:  11/17/2020 FINDINGS: Brain: Thin left cerebral convexity hypodense subdural collection is stable in size. Trace right cerebral convexity hypodense subdural collection is also unchanged. No significant mass effect. Ventricles are stable in caliber. Right frontal approach shunt catheter is in stable position. 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 Additional patchy and confluent areas of hypoattenuation likely reflecting stable chronic microvascular ischemic changes. There is gliosis along the catheter tract. No new intracranial hemorrhage. No new loss of gray-white differentiation. 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 Coil packing at the basilar tip with associated streak artifact.There is atherosclerotic calcification at the skull base. Skull: Right craniotomy and burr hole. Sinuses/Orbits: No acute finding. Other: None. IMPRESSION: No substantial change in thin cerebral convexity subdural collections. No significant mass effect. Stable ventricle caliber and positioning of shunt catheter. Electronically Signed   By: Macy Mis M.D.   On: 11/19/2020 09:30    Scheduled Meds: . amiodarone  400 mg Oral BID  . calcitRIOL  1 mcg Oral Q T,Th,Sa-HD  . Chlorhexidine Gluconate Cloth  6 each Topical Q0600  . [START ON 11/24/2020] darbepoetin (ARANESP) injection - NON-DIALYSIS  100 mcg Subcutaneous Q Sat-1800  . feeding supplement (NEPRO CARB STEADY)  237 mL Oral TID BM  . levETIRAcetam  500 mg Oral  BID  . methimazole  10 mg Oral BID  . midodrine  10 mg Oral BID WC  . multivitamin  1 tablet Oral QHS  . pantoprazole  40 mg Oral BID  . polyethylene glycol  17 g Oral BID  . rosuvastatin  10 mg Oral Daily   Continuous Infusions:  PRN Meds: [DISCONTINUED] acetaminophen **OR** acetaminophen, acetaminophen, sodium chloride flush  Time spent: 35 minutes  Author: Berle Mull, MD [facility] 11/19/2020 9:35 PM  To reach On-call, see care teams to locate the attending and reach out via [email or web address]. Between 7PM-7AM, please contact night-coverage If you still have difficulty reaching the attending provider, please page the [facility] (Director on Call) for [facility] on amion for assistance.

## 2020-11-19 NOTE — Progress Notes (Signed)
     Referral received for [facility] :goals of care discussion. Chart reviewed and updates received from RN. Patient is unable to engage appropriately in discussions. Attempted to contact patient's daughter, [name] x2. Unable to reach. Voicemail left with contact information given.   PMT will re-attempt to contact family at a later time/date. Detailed note and recommendations to follow once GOC has been completed.   Thank you for your referral and allowing PMT to assist in [name]'s care.   [name], AGPCNP-BC Palliative Medicine Team  Phone: [phone] Amion: N. Cousar   NO CHARGE

## 2020-11-19 NOTE — Progress Notes (Signed)
Initial Nutrition Assessment  DOCUMENTATION CODES:   Not applicable  INTERVENTION:   -Continue with liberalized diet of regular -Nepro Shake po TID, each supplement provides 425 kcal and 19 grams protein -Renal MVI daily  NUTRITION DIAGNOSIS:   Increased nutrient needs related to acute illness (COVID-19) as evidenced by estimated needs.  GOAL:   Patient will meet greater than or equal to 90% of their needs  MONITOR:   PO intake,Supplement acceptance,Labs,Weight trends,Skin,I & O's  REASON FOR ASSESSMENT:   Consult Assessment of nutrition requirement/status  ASSESSMENT:   [name] is a 60 y.o. female with medical history significant of ESRD on HD TTS, CAD, hypertension, hyperlipidemia, paroxysmal A. 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  Patient is a poor historian and able to provide limited history.  Reports 1 week history of nausea, vomiting, and epigastric abdominal pain.  She is not sure when she goes for dialysis.  Denies fevers, chills, cough, shortness of breath, or chest pain.  She is not vaccinated against COVID.  Pt admitted with a-fib with RVR.   1/23- s/p BSE- advanced to regular consistency diet with thin liquids 1/27- s/p Open reduction and internal fixation of left extra-articular distal radius fracture  Reviewed I/O's: +480 ml x 24 hours and +4.8 L since admission  Pt unavailable at time of visit.   Pt with poor oral intake Noted meal completion 10-50%.   Per nephrology notes, EDW 38 kg. Reviewed wt hx; noted wt gain over the past year.   Pt is at high risk for malnutrition, however, unable to identify at this time. Pt would greatly benefit from addition of oral nutrition supplements.   Medications reviewed and include aranesp, keppra, and miralax.    Albumin has a half-life of 21 days and is strongly affected by stress response and inflammatory process, therefore, do not expect to see an improvement in this lab value during acute hospitalization. When a patient presents with low albumin, it is likely skewed due to the acute inflammatory response.  Unless it is suspected that patient had poor PO intake or malnutrition prior to admission, then RD should not be consulted solely for low albumin. Note that low albumin is no longer used to diagnose malnutrition; [place] uses the new malnutrition guidelines published by the American Society for Parenteral and Enteral Nutrition (A.S.P.E.N.) and the Academy of Nutrition and Dietetics (AND).    Labs reviewed: Na: 131, K: 3.2, CBGS: 112-115.  Diet Order:   Diet Order            Diet regular Room service appropriate? Yes; Fluid consistency: Thin  Diet effective now                 EDUCATION NEEDS:   No education needs have been identified at this time  Skin:  Skin Assessment: Skin Integrity Issues: Skin Integrity Issues:: Incisions Incisions: closed rt arm  Last BM:  11/18/20  Height:   Ht Readings from Last 1 Encounters:  10/26/20 '5\' 1"'$  (1.549 m)    Weight:   Wt Readings from Last 1 Encounters:  11/19/20 51 kg    Ideal Body Weight:  47.7 kg  BMI:  Body mass index is 21.24 kg/m.  Estimated Nutritional Needs:   Kcal:  1550-1750  Protein:  80-95 grams  Fluid:  1000 ml + UOP  Loistine Chance, RD, LDN, [place] Registered Dietitian II Certified Diabetes Care and Education Specialist Please refer to [facility] for RD and/or RD on-call/weekend/after hours pager

## 2020-11-19 NOTE — Progress Notes (Signed)
Patient ID: [name], female   DOB: Apr 28, 1961, 60 y.o.   MRN: [id number] Very little on the scans to suggest a 7 year progressive glioma versus an infarct. The subdurals are quite small and not operative at this time.

## 2020-11-20 DIAGNOSIS — I4891 Unspecified atrial fibrillation: Secondary | ICD-10-CM | POA: Diagnosis not present

## 2020-11-20 DIAGNOSIS — E440 Moderate protein-calorie malnutrition: Secondary | ICD-10-CM | POA: Insufficient documentation

## 2020-11-20 MED ORDER — AMIODARONE HCL 200 MG PO TABS
400.0000 mg | ORAL_TABLET | Freq: Every day | ORAL | Status: DC
  Administered 2020-11-21 – 2020-11-23 (×3): 400 mg via ORAL
  Filled 2020-11-20 (×3): qty 2

## 2020-11-20 MED ORDER — AMIODARONE HCL 200 MG PO TABS: 200.0000 mg | ORAL_TABLET | Freq: Every day | ORAL | Status: DC

## 2020-11-20 NOTE — Progress Notes (Signed)
Patient ID: [name], female   DOB: 1961/04/15, 60 y.o.   MRN: [id number] S: No events overnight.  Palliative care consulted and trying to contact family. O:BP 96/66 (BP Location: Left Leg)   Pulse 80   Temp 98.5 F (36.9 C) (Oral)   Resp 17   Wt 48.7 kg   SpO2 100%   BMI 20.29 kg/m   Intake/Output Summary (Last 24 hours) at 11/20/2020 1102 Last data filed at 11/20/2020 0839 Gross per 24 hour  Intake 240 ml  Output 1001 ml  Net -761 ml   Intake/Output: I/O last 3 completed shifts: In: 360 [P.O.:360] Out: 1001 [Other:1000; Stool:1]  Intake/Output this shift:  Total I/O In: 120 [P.O.:120] Out: -  Weight change: 3.3 kg Gen: slowed mentation, NAD CVS: IRR no rub Resp: cta Abd: +BS, soft, NT/ND Ext: no edema, RAVF +T/B  Recent Labs  Lab 11/15/20 1413 11/15/20 2033 11/16/20 1902  NA 145 134* 131*  K 3.0* 4.5 5.2*  CL  --  96* 94*  CO2  --  17* 17*  GLUCOSE  --  114* 118*  BUN  --  57* 67*  CREATININE  --  7.87* 8.38*  ALBUMIN  --  2.2* 2.3*  CALCIUM  --  9.9 9.6  AST  --  14* 16  ALT  --  15 11   Liver Function Tests: Recent Labs  Lab 11/15/20 2033 11/16/20 1902  AST 14* 16  ALT 15 11  ALKPHOS 84 96  BILITOT 0.6 0.7  PROT 5.0* 5.5*  ALBUMIN 2.2* 2.3*   No results for input(s): LIPASE, AMYLASE in the last 168 hours. No results for input(s): AMMONIA in the last 168 hours. CBC: Recent Labs  Lab 11/15/20 1413 11/15/20 2033 11/16/20 1902  WBC  --  8.6 11.3*  NEUTROABS  --  7.0 9.5*  HGB 9.2* 11.3* 12.0  HCT 27.0* 32.6* 33.2*  MCV  --  80.5 78.5*  PLT  --  326 292   Cardiac Enzymes: No results for input(s): CKTOTAL, CKMB, CKMBINDEX, TROPONINI in the last 168 hours. CBG: Recent Labs  Lab 11/15/20 2000 11/15/20 2300 11/16/20 0332 11/16/20 0854 11/16/20 1249  GLUCAP 130* 147* 133* 115* 112*    Iron Studies: No results for input(s): IRON, TIBC, TRANSFERRIN, FERRITIN in the last 72 hours. Studies/Results: CT HEAD WO CONTRAST  Result Date:  11/19/2020 CLINICAL DATA:  Subdural hematoma, shunted hydrocephalus, follow-up EXAM: CT HEAD WITHOUT CONTRAST TECHNIQUE: Contiguous axial images were obtained from the base of the skull through the vertex without intravenous contrast. COMPARISON:  11/17/2020 FINDINGS: Brain: Thin left cerebral convexity hypodense subdural collection is stable in size. Trace right cerebral convexity hypodense subdural collection is also unchanged. No significant mass effect. Ventricles are stable in caliber. Right frontal approach shunt catheter is in stable position. 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 Additional patchy and confluent areas of hypoattenuation likely reflecting stable chronic microvascular ischemic changes. There is gliosis along the catheter tract. No new intracranial hemorrhage. No new loss of gray-white differentiation. 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 Coil packing at the basilar tip with associated streak artifact.There is atherosclerotic calcification at the skull base. Skull: Right craniotomy and burr hole. Sinuses/Orbits: No acute finding. Other: None. IMPRESSION: No substantial change in thin cerebral convexity subdural collections. No significant mass effect. Stable ventricle caliber and positioning of shunt catheter. Electronically Signed   By: Macy Mis  M.D.   On: 11/19/2020 09:30   . [START ON 11/21/2020] amiodarone  400 mg Oral Daily   Followed by  . [START ON 11/27/2020] amiodarone  200 mg Oral Daily  . calcitRIOL  1 mcg Oral Q T,Th,Sa-HD  . Chlorhexidine Gluconate Cloth  6 each Topical Q0600  . [START ON 11/24/2020] darbepoetin (ARANESP) injection - NON-DIALYSIS  100 mcg Subcutaneous Q Sat-1800  . feeding supplement (NEPRO CARB STEADY)  237 mL Oral TID BM  . levETIRAcetam  500 mg Oral BID  . methimazole  10 mg Oral BID  . midodrine  10 mg Oral BID WC  .  multivitamin  1 tablet Oral QHS  . pantoprazole  40 mg Oral BID  . polyethylene glycol  17 g Oral BID  . rosuvastatin  10 mg Oral Daily    BMET    Component Value Date/Time   NA 131 (L) 11/16/2020 1902   K 5.2 (H) 11/16/2020 1902   CL 94 (L) 11/16/2020 1902   CO2 17 (L) 11/16/2020 1902   GLUCOSE 118 (H) 11/16/2020 1902   BUN 67 (H) 11/16/2020 1902   CREATININE 8.38 (H) 11/16/2020 1902   CALCIUM 9.6 11/16/2020 1902   GFRNONAA 5 (L) 11/16/2020 1902   GFRAA 5 (L) 02/03/2019 0652   CBC    Component Value Date/Time   WBC 11.3 (H) 11/16/2020 1902   RBC 4.23 11/16/2020 1902   HGB 12.0 11/16/2020 1902   HCT 33.2 (L) 11/16/2020 1902   PLT 292 11/16/2020 1902   MCV 78.5 (L) 11/16/2020 1902   MCH 28.4 11/16/2020 1902   MCHC 36.1 (H) 11/16/2020 1902   RDW 18.2 (H) 11/16/2020 1902   LYMPHSABS 1.1 11/16/2020 1902   MONOABS 0.6 11/16/2020 1902   EOSABS 0.0 11/16/2020 1902   BASOSABS 0.0 11/16/2020 1902    P HD: TTS [facility] 3h 38kg 2/2 bath TDC Hep none - calc 120 ug tiw - mircera 75 q2 last 08/23/20 - velphoro 2 ac tid  - last HD 3hrs on 1/18, mostly compliant. - missed VVS post OP f/u - sp plication on [id number]   Assessment/Plan:  1. COVID -19 viral pna - hazy opacities on CXR. SP remdesivir. On IV cefepime. Is now out of isolation on 1/31 (10days). On room air.  2. Acute on chronic anemia - Hb 9.7 >> 11.5 after 2u prbc's on 1/21. gavearanesp 100 mcg on 1/22. GI consulted, conservative measures recommended. Not a/c candidate. 3. 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 Parox afib w/ RVR -no a/c, as above, IV amio now is po amio. Cards following 5. 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 Per primary team. 6. ESRD - HD TTS.Had HD here Sun/ Tues / Friday this week. Off schedule. Had HD 11/19/20.  Plan to get back on schedule sometime this week as  schedule allows. 7. AMS - appreciate efforts of primary team.  8. L wrist fracture - sp ORIF 1/27 by ortho 9. Volume - no vol excess on exam. CXR no edema, LLL infiltrate. Wt'sprobnot correct.  10. Metabolic bone disease - binders when taking PO. Continue calcitriol 11. GOC - very frail, multiple complicating medical problems. Per primary team consulting pall care team.  Donetta Potts, MD [facility] [phone]

## 2020-11-20 NOTE — Progress Notes (Signed)
Patient ID: [name], female   DOB: 1960/12/12, 60 y.o.   MRN: [id number] BP 101/81   Pulse 80   Temp 98.5 F (36.9 C) (Oral)   Resp 20   Wt 48.7 kg   SpO2 95%   BMI 20.29 kg/m  Alert, following all commands No evidence aphasia, speech is clear, fluent Symmetric facies Tongue midline, uvula midline No real need for MRi at this time. She would not be an operative candidate, and this region is not influencing her hospitalization. Will follow.

## 2020-11-20 NOTE — Progress Notes (Signed)
[facility] Progress Note  Patient: [name]    [id number]  DOA: 11/08/2020     Date of Service: the patient was seen and examined on 11/20/2020  Brief hospital course: Past medical history of ESRD on HD TTS, CAD, HTN, HLD, paroxysmal A. fib not on any anticoagulation secondary to [place], seizure disorder, CVA in 2015.  Presents with complaints of nausea vomiting and epigastric pain.  Found to have COVID-19 pneumonia incidentally along with A. fib with RVR and also acute on chronic anemia.  Currently plan is follow-up on palliative care consultation for goals of care.  Can be discharged to SNF in 1 to 2 days as long as p.o. intake remains stable.  Assessment and Plan: 1. Acute COVID-19 Viral Pneumonia CXR: hazy bilateral peripheral opacities Oxygen requirement: On room air CRP: Trending up, could be postoperative, monitor Remdesivir: Completed on 1/25 Steroids: Not indicated given lack of hypoxia Baricitinib/Actemra: Not indicated given concern with bacterial infection Antibiotics: Treated with IV cefepime for 5 days DVT Prophylaxis: Place and maintain sequential compression device Start: 11/09/20 1030  Prone positioning and incentive spirometer use recommended.  Overall plan: From Covid point of view the patient remained stable Isolation duration: First positive test date 11/08/2020,  Out of the isolation on 1/31.  The treatment plan and use of medications and known side effects were discussed with patient/family. It was clearly explained that complete risks and long-term side effects are unknown. Patient/family agree with the treatment plan.    2. Acute on chronic anemia. 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 Epigastric pain Constipation-resolved On presentation hemoglobin dropped down significantly from baseline of 11-6.8. No evidence of acute bleeding or hemolysis.  No schistocytes seen on the smear. Lipase mildly elevated. CT abdomen  shows no acute abnormality. X-ray abdomen also unremarkable. GI was consulted who recommended continued conservative measures, no intervention and currently signed off. Lipase elevation likely secondary to severe constipation currently resolved.. Not a candidate for any anticoagulation given her ongoing anemia. Continue PPI. Transfuse for hemoglobin less than 7 or hemodynamic instability. H&H stable.  3.  Paroxysmal A. fib with RVR Not on any anticoagulation given history of ICH. Cardiology was consulted Currently signed off. Recommend to continue p.o. amiodarone. Continue to monitor.  4.  ESRD on HD TTS Postdialysis hypotension Nephrology consulted. Appreciate assistance.  HD frequency per nephrology. Receiving Aranesp, calcitriol.  5.  Right CFV DVT Ultrasound lower extremity positive for DVT, nonocclusive, age indeterminant on right leg. Patient not a candidate for anticoagulation given her ongoing anemia as well as prior history of ICH. Discussed with daughter and recommended IVC filter. Discussed with IR recommend currently holding off on intervention.  Patient remains at risk for progression of the DVT given her immobility secondary to her critical condition. D-dimer also trending up. Repeat Doppler on 1/29 shows prior clot appears to have mostly resolved. no propagation is seen.   6.  Left wrist fracture Underwent surgery.  Tolerated well.  Pain well controlled postoperatively. Orthopedics Dr. Jeannie Fend plan on removing her splint and sutures at 2 weeks from 1/27,.  They will then progress her into either a short period of cast immobilization or removable splint pending her clinical improvement going forward.  7.  Underweight. Body mass index is 20.29 kg/m.   Malnutrition Type: Nutrition Problem: Moderate Malnutrition Etiology: chronic illness (ESRD on HD) Nutrition Interventions: Interventions: MVI,Nepro shake Placing the patient at risk for poor outcome  8.   Acute metabolic encephalopathy. History  of VP shunt for obstructive hydrocephalus 2013 Bilateral subdural fluid collection with with some mass-effect. 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 No single etiology. CT scan was performed which shows subdural collection. Appreciate neurosurgery consultation They do not think that subdural correction is symptomatic in nature. Also has an ongoing chronic appearing hypodensity in the left parietal lobe which appears to be progressing and likely concerning for glioma or infection like lesion.   MRI is recommended but with patient's aneurysm clips are MRI incompatible. Repeat CT scan on 1/31 shows no change in subdural hematoma. Neurosurgery recommends no further work-up or treatment.  9.  Goals of care. Currently patient is very frail.  Has a long hospital stay with multiple comorbidities including COVID-19 pneumonia, acute on chronic anemia, acute metabolic encephalopathy with subdural fluid collection as well as worsening of left parietal hypodensity with minimal oral intake, A. fib with RVR, left wrist fracture requiring surgery and recurrent hypotension. Patient's prognosis is very poor in the setting of poor p.o. intake with all these comorbidities and mental status changes. Palliative care consulted.  Diet: Regular diet DVT Prophylaxis:   Place and maintain sequential compression device Start: 11/09/20 1030  Advance goals of care discussion: Full code  Family Communication: no family was present at bedside, at the time of interview.  Discussed with daughter on the phone on 11/17/2020  Disposition:  Status is: Inpatient  Remains inpatient appropriate because: Patient with poor p.o. intake.  Gradually improving.  Dispo: The patient is from: Home              Anticipated d/c is to: SNF              Anticipated d/c date is: 1 to 2 days              Patient currently is not medically stable to d/c.    Difficult to place patient No  Subjective: No nausea no vomiting.  No fevers or chills.  More laterally.  Pain well controlled.  Physical Exam:  General: Appear in mild distress, no Rash; Oral Mucosa Clear, moist. no Abnormal Neck Mass Or lumps, Conjunctiva normal  Cardiovascular: S1 and S2 Present, no Murmur, Respiratory: good respiratory effort, Bilateral Air entry present and CTA, no Crackles, no wheezes Abdomen: Bowel Sound present, Soft and no tenderness Extremities: no Pedal edema Neurology: alert and oriented to time, place, and person affect appropriate. no new focal deficit Gait not checked due to patient safety concerns    Vitals:   11/20/20 1300 11/20/20 1400 11/20/20 1500 11/20/20 1600  BP:    101/81  Pulse:      Resp: 20 (!) 24 (!) 25 20  Temp:      TempSrc:      SpO2: 97% 95% 96% 95%  Weight:        Intake/Output Summary (Last 24 hours) at 11/20/2020 1935 Last data filed at 11/20/2020 1727 Gross per 24 hour  Intake 480 ml  Output 1 ml  Net 479 ml   Filed Weights   11/19/20 0330 11/19/20 0912 11/20/20 0400  Weight: 47.7 kg 51 kg 48.7 kg    Data Reviewed: I have personally reviewed and interpreted daily labs, tele strips, imaging. I reviewed all nursing notes, pharmacy notes, vitals, pertinent old records I have discussed plan of care as described above with RN and patient/family.  CBC: Recent Labs  Lab 11/15/20 1413 11/15/20 2033 11/16/20 1902  WBC  --  8.6 11.3*  NEUTROABS  --  7.0 9.5*  HGB 9.2* 11.3* 12.0  HCT 27.0* 32.6* 33.2*  MCV  --  80.5 78.5*  PLT  --  326 [id number]   Basic Metabolic Panel: Recent Labs  Lab 11/15/20 1413 11/15/20 2033 11/16/20 1902  NA 145 134* 131*  K 3.0* 4.5 5.2*  CL  --  96* 94*  CO2  --  17* 17*  GLUCOSE  --  114* 118*  BUN  --  57* 67*  CREATININE  --  7.87* 8.38*  CALCIUM  --  9.9 9.6  MG  --  2.1 2.3    Studies: No results found.  Scheduled Meds: . [START ON 11/21/2020] amiodarone  400 mg Oral Daily    Followed by  . [START ON 11/27/2020] amiodarone  200 mg Oral Daily  . calcitRIOL  1 mcg Oral Q T,Th,Sa-HD  . Chlorhexidine Gluconate Cloth  6 each Topical Q0600  . [START ON 11/24/2020] darbepoetin (ARANESP) injection - NON-DIALYSIS  100 mcg Subcutaneous Q Sat-1800  . feeding supplement (NEPRO CARB STEADY)  237 mL Oral TID BM  . levETIRAcetam  500 mg Oral BID  . methimazole  10 mg Oral BID  . midodrine  10 mg Oral BID WC  . multivitamin  1 tablet Oral QHS  . pantoprazole  40 mg Oral BID  . polyethylene glycol  17 g Oral BID  . rosuvastatin  10 mg Oral Daily   Continuous Infusions:  PRN Meds: [DISCONTINUED] acetaminophen **OR** acetaminophen, acetaminophen, sodium chloride flush  Time spent: 35 minutes  Author: Berle Mull, MD [facility] 11/20/2020 7:35 PM  To reach On-call, see care teams to locate the attending and reach out via [email or web address]. Between 7PM-7AM, please contact night-coverage If you still have difficulty reaching the attending provider, please page the [facility] (Director on Call) for [facility] on amion for assistance.

## 2020-11-20 NOTE — Progress Notes (Signed)
Nutrition Follow-up  DOCUMENTATION CODES:   Non-severe (moderate) malnutrition in context of chronic illness  INTERVENTION:   -Continue Nepro Shake po TID, each supplement provides 425 kcal and 19 grams protein -Continue renal MVI daily -Magic cup TID with meals, each supplement provides 290 kcal and 9 grams of protein  NUTRITION DIAGNOSIS:   Moderate Malnutrition related to chronic illness (ESRD on HD) as evidenced by mild fat depletion,moderate fat depletion,moderate muscle depletion,severe muscle depletion.  Ongoing  GOAL:   Patient will meet greater than or equal to 90% of their needs  Progressing   MONITOR:   PO intake,Supplement acceptance,Labs,Weight trends,Skin,I & O's  REASON FOR ASSESSMENT:   Consult Assessment of nutrition requirement/status  ASSESSMENT:   [name] is a 60 y.o. female with medical history significant of ESRD on HD TTS, CAD, hypertension, hyperlipidemia, paroxysmal A. 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  Patient is a poor historian and able to provide limited history.  Reports 1 week history of nausea, vomiting, and epigastric abdominal pain.  She is not sure when she goes for dialysis.  Denies fevers, chills, cough, shortness of breath, or chest pain.  She is not vaccinated against COVID.  1/23- s/p BSE- advanced to regular consistency diet with thin liquids 1/27- s/p Open reduction and internal fixation of left extra-articular distal radius fracture  Reviewed I/O's: -761 ml x 24 hours and +4.1 L since admission  Spoke with pt at bedside, who was unable to provide meaningful history. When asked how her appetite was, pt shares "I eat my food". However, pt does not remember the last time she has eaten. Nepro shake in front of pt, which was about 50%  consumed; pt reports "I don't like it".   Pt answered most other questions with "I don't know" or "I don't remember".   Meal completion remains poor; noted meal completion 10-60%.   Palliative care consult pending for goals of care discussions.   Medications reviewed and include keppra and miralax.   Labs reviewed: Na: 131, K: 5.2.   NUTRITION - FOCUSED PHYSICAL EXAM:  Flowsheet Row Most Recent Value  Orbital Region Mild depletion  Upper Arm Region Mild depletion  Thoracic and Lumbar Region No depletion  Buccal Region Mild depletion  Temple Region Mild depletion  Clavicle Bone Region Moderate depletion  Clavicle and Acromion Bone Region Moderate depletion  Scapular Bone Region Moderate depletion  Dorsal Hand Mild depletion  Patellar Region Severe depletion  Anterior Thigh Region Severe depletion  Posterior Calf Region Severe depletion  Edema (RD Assessment) None  Hair Reviewed  Eyes Reviewed  Mouth Reviewed  Skin Reviewed  Nails Reviewed       Diet Order:   Diet Order            Diet regular Room service appropriate? Yes; Fluid consistency: Thin  Diet effective now                 EDUCATION NEEDS:   Not appropriate for education at this time  Skin:  Skin Assessment: Skin Integrity Issues: Skin Integrity Issues:: Incisions Incisions: closed rt arm  Last BM:  11/20/20  Height:   Ht Readings from Last 1 Encounters:  10/26/20 '5\' 1"'$  (1.549 m)    Weight:   Wt Readings from Last 1 Encounters:  11/20/20 48.7 kg    Ideal  Body Weight:  47.7 kg  BMI:  Body mass index is 20.29 kg/m.  Estimated Nutritional Needs:   Kcal:  1550-1750  Protein:  80-95 grams  Fluid:  1000 ml + UOP    Loistine Chance, RD, LDN, CDCES Registered Dietitian II Certified Diabetes Care and Education Specialist Please refer to [facility] for RD and/or RD on-call/weekend/after hours pager

## 2020-11-20 NOTE — Progress Notes (Signed)
Occupational Therapy Treatment Patient Details Name: [name] MRN: [id number] DOB: 10/19/1961 Today's Date: 11/20/2020    History of present illness 60 y.o. female with medical history significant of ESRD on HD TTS, CAD, hypertension, hyperlipidemia, paroxysmal A. 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 Found to be in A-fib with RVR, unvaccinated, COVID+, CT showing small L pleural effusion at the L lung base. Reports L wrist pain from daughter falling on her wrist prior to coming to ED found to have fx and  L UE wrist splint applied in ED. NWB will eventually require ORIF. Rapid response 11/09/20 transfer to ICU, placement of untunneled R femoral central venous catheter, D/c'd 11/11/20   OT comments  Pt received in bed, awake and slight lethargic, agreeable to session but refuses to sit EOB or to sit in recliner. Mod A supine to sit, OT assisted with LUE due to NWB status with splint. Pt S sitting EOB for self feeding with use of RUE, assist required to stabilize and management of  ice cream container. Limited tolerance with sitting EOB and asked to return to supine with mod A to max A to reposition correctly. LUE elevated with pillow to improve edema. Pt is self limiting and refuses to self ROM of L hand digits. Educated on the importance of ROM to prevent stiffness and continue function. DC and freq remains the same.    Follow Up Recommendations  SNF;Supervision/Assistance - 24 hour    Equipment Recommendations  3 in 1 bedside commode    Recommendations for Other Services      Precautions / Restrictions Precautions Precautions: Fall Precaution Comments: NWB on LUE Required Braces or Orthoses: Splint/Cast Splint/Cast: L wrist volar splint Restrictions Weight Bearing Restrictions: Yes LUE Weight  Bearing: Non weight bearing Other Position/Activity Restrictions: R untunneled femoral cath no flexion past 90 degrees       Mobility Bed Mobility Overal bed mobility: Needs Assistance Bed Mobility: Supine to Sit     Supine to sit: Mod assist     General bed mobility comments: max encouragement to sit EOB, pt declines, but then agrees to eat ice cream seated on table.  Transfers Overall transfer level: Needs assistance Equipment used: 1 person hand held assist Transfers: Sit to/from Stand Sit to Stand: Mod assist         General transfer comment: declined    Balance Overall balance assessment: Needs assistance;History of Falls   Sitting balance-Leahy Scale: Fair     Standing balance support: Single extremity supported Standing balance-Leahy Scale: Poor                             ADL either performed or assessed with clinical judgement   ADL Overall ADL's : Needs assistance/impaired Eating/Feeding: Minimal assistance;Sitting Eating/Feeding Details (indicate cue type and reason): requiring assist with set up and management of positioning ice cream to self feeding with R hand.                     Toilet Transfer: Moderate assistance Toilet Transfer Details (indicate cue type and reason): simulated transfer with sit to stand transition from EOB, decreased tolerance. OT assisted with LUE due to NWB and immobilized in splint.         Functional mobility during ADLs: Moderate  assistance (modA for bed mobility; totalA assuming for OOB) General ADL Comments: HR stable at 84-85 throughout session currentl RA. Improved use of RUE to self feeding while seated EOB.     Vision   Vision Assessment?: No apparent visual deficits   Perception     Praxis      Cognition Arousal/Alertness: Awake/alert Behavior During Therapy: Flat affect Overall Cognitive Status: Impaired/Different from baseline Area of Impairment: Awareness;Safety/judgement;Following  commands;Memory;Attention;Orientation;Problem solving                 Orientation Level: Situation;Time Current Attention Level: Selective Memory: Decreased recall of precautions;Decreased short-term memory Following Commands: Follows one step commands inconsistently;Follows one step commands with increased time Safety/Judgement: Decreased awareness of safety;Decreased awareness of deficits Awareness: Intellectual Problem Solving: Slow processing;Difficulty sequencing;Requires tactile cues;Requires verbal cues          Exercises     Shoulder Instructions       General Comments      Pertinent Vitals/ Pain       Pain Assessment: Faces Faces Pain Scale: Hurts whole lot Pain Location: movement on LUE, pt excessively guarding it Pain Descriptors / Indicators: Grimacing;Moaning Pain Intervention(s): Limited activity within patient's tolerance;Monitored during session;Repositioned  Home Living                                          Prior Functioning/Environment              Frequency  Min 2X/week        Progress Toward Goals  OT Goals(current goals can now be found in the care plan section)  Progress towards OT goals: Progressing toward goals (slowly progressing)  Acute Rehab OT Goals Patient Stated Goal: get pain meds OT Goal Formulation: Patient unable to participate in goal setting Time For Goal Achievement: 11/26/20 Potential to Achieve Goals: Poor ADL Goals Pt Will Perform Grooming: with set-up;sitting Pt Will Perform Lower Body Dressing: with min guard assist;sit to/from stand Pt Will Transfer to Toilet: with min assist;ambulating;bedside commode Pt Will Perform Toileting - Clothing Manipulation and hygiene: with min assist;sit to/from stand Pt/caregiver will Perform Home Exercise Program: Increased strength;Right Upper extremity;With Supervision Additional ADL Goal #1: Pt will participate in higher level cognitive tasks to  increase awareness and to continue to assess deficits.  Plan Discharge plan remains appropriate;Frequency remains appropriate    Co-evaluation                 AM-PAC OT "6 Clicks" Daily Activity     Outcome Measure   Help from another person eating meals?: A Little Help from another person taking care of personal grooming?: A Lot Help from another person toileting, which includes using toliet, bedpan, or urinal?: Total Help from another person bathing (including washing, rinsing, drying)?: A Lot Help from another person to put on and taking off regular upper body clothing?: A Lot Help from another person to put on and taking off regular lower body clothing?: A Lot 6 Click Score: 12    End of Session Equipment Utilized During Treatment: Gait belt  OT Visit Diagnosis: Muscle weakness (generalized) (M62.81);Pain   Activity Tolerance Patient limited by pain;Patient limited by fatigue   Patient Left in bed;with call bell/phone within reach;with bed alarm set   Nurse Communication Mobility status        Time: [id number] OT Time Calculation (min): 19 min  Charges:  OT General Charges $OT Visit: 1 Visit OT Treatments $Self Care/Home Management : 8-22 mins  Minus Breeding, MSOT, OTR/L  Supplemental Rehabilitation Services  [phone]    Marius Ditch 11/20/2020, 1:35 PM

## 2020-11-21 ENCOUNTER — Inpatient Hospital Stay (HOSPITAL_COMMUNITY): Payer: 59

## 2020-11-21 DIAGNOSIS — R1013 Epigastric pain: Secondary | ICD-10-CM | POA: Diagnosis not present

## 2020-11-21 DIAGNOSIS — Z515 Encounter for palliative care: Secondary | ICD-10-CM | POA: Diagnosis not present

## 2020-11-21 DIAGNOSIS — N186 End stage renal disease: Secondary | ICD-10-CM | POA: Diagnosis not present

## 2020-11-21 DIAGNOSIS — Z7189 Other specified counseling: Secondary | ICD-10-CM

## 2020-11-21 DIAGNOSIS — E440 Moderate protein-calorie malnutrition: Secondary | ICD-10-CM | POA: Diagnosis not present

## 2020-11-21 DIAGNOSIS — I4891 Unspecified atrial fibrillation: Secondary | ICD-10-CM | POA: Diagnosis not present

## 2020-11-21 DIAGNOSIS — Z992 Dependence on renal dialysis: Secondary | ICD-10-CM

## 2020-11-21 LAB — CBC
HCT: 29.9 % — ABNORMAL LOW (ref 36.0–46.0)
Hemoglobin: 11 g/dL — ABNORMAL LOW (ref 12.0–15.0)
MCH: 27.9 pg (ref 26.0–34.0)
MCHC: 36.8 g/dL — ABNORMAL HIGH (ref 30.0–36.0)
MCV: 75.9 fL — ABNORMAL LOW (ref 80.0–100.0)
Platelets: 163 10*3/uL (ref 150–400)
RBC: 3.94 MIL/uL (ref 3.87–5.11)
RDW: 21 % — ABNORMAL HIGH (ref 11.5–15.5)
WBC: 9.8 10*3/uL (ref 4.0–10.5)
nRBC: 0.9 % — ABNORMAL HIGH (ref 0.0–0.2)

## 2020-11-21 LAB — BASIC METABOLIC PANEL
Anion gap: 15 (ref 5–15)
BUN: 39 mg/dL — ABNORMAL HIGH (ref 6–20)
CO2: 24 mmol/L (ref 22–32)
Calcium: 9.5 mg/dL (ref 8.9–10.3)
Chloride: 100 mmol/L (ref 98–111)
Creatinine, Ser: 6.3 mg/dL — ABNORMAL HIGH (ref 0.44–1.00)
GFR, Estimated: 7 mL/min — ABNORMAL LOW (ref >60)
Glucose, Bld: 111 mg/dL — ABNORMAL HIGH (ref 70–99)
Potassium: 5.1 mmol/L (ref 3.5–5.1)
Sodium: 139 mmol/L (ref 135–145)

## 2020-11-21 MED ORDER — ALBUTEROL SULFATE (2.5 MG/3ML) 0.083% IN NEBU: 2.5000 mg | INHALATION_SOLUTION | Freq: Once | RESPIRATORY_TRACT | Status: DC | PRN

## 2020-11-21 NOTE — Care Management Important Message (Signed)
Important Message  Patient Details  Name: Nakyra Dorrance MRN: [id number] Date of Birth: 01-21-1961   Medicare Important Message Given:  Yes     Shelda Altes 11/21/2020, 12:40 PM

## 2020-11-21 NOTE — Significant Event (Addendum)
Rapid Response Event Note   Reason for Call : [employer] WOB Initial Focused Assessment:  Nursing staff notified me of pt with RR 40s and increased WOB. Upon arrival, Ms. Damaso is alert, in significant distress with moaning and accessory muscle use. Sats 93% on 3L [place]. Staff stated pt was on RA prior to event. Pt was previously on 2L [place] earlier today. BBS some upper airway wheeze (forced exhalation) otherwise Clear. Skin warm pink and clammy. Stat PCXR ordered.  HR 146 afib?, 115/38, RR 35 with sats 97% on 3L .  Pt is calming down and resting comfortably following PCXR. When asked if she is breathing easier, Ms. [place] said yes.  Resting HR was in 80s earlier today. Her HR is coming down since event started, now 125-130 afib. Pt is on PO Amiodarone and received dose around 1pm. HD was initiated at 2310. She increaased her HR 140s afib at 2330. Probable flash pulmonary edema during initiation of HD. Pt is continuing to look calm. Will continue to monitor.  PCXR results-IMPRESSION: 1. Bilateral pleural effusions, moderately large on the right and small to moderate on the left. 2. Progressive pulmonary edema. 3. Chronic cardiomegaly.  Aortic Atherosclerosis (ICD10-I70.0). Per Dr. Joelyn Oms.   0000-HR 130 afib, 100/23, RR 29, sats 97%  Addendum: 0130-HR 125-130 afib, 93/36, RR 26 sats 97% on 3L . No distress.    Interventions:  -Stat PCXR  Plan of Care:  -Will continue to monitor since pt is improving. -continue HD (goal -1L) -Titrate 02 for sats >92% -Notify RRRN and/or primary MD if further assistance is needed.    MD Notified: Dr. Marlowe Sax Call Time: 2334 Arrival Time: 2336 End Time: 0018  Madelynn Done, RN

## 2020-11-21 NOTE — Significant Event (Incomplete)
Rapid Response Event Note   Reason for Call : [employer] WOB Initial Focused Assessment:  Nursing staff notified me of pt with RR 40s and increased WOB. Upon arrival, [name] is alert, in significant distress with moaning and accessory muscle use. Sats 93% on 3L [place]. Staff stated pt was on RA prior to event. Pt was previously on 2L [place] earlier today. BBS some upper airway wheeze (forced exhalation) otherwise Clear. Skin warm pink and clammy. Stat PCXR ordered.  HR 146 afib?, 115/38, RR 35 with sats 97% on 3L [place].  Pt is calming down and resting comfortably following PCXR. When asked if she is breathing easier, [name] said yes.  Resting HR was in 80s earlier today. Her HR is coming down since event started, now 125-130 afib. Pt is on PO Amiodarone and received dose around 1pm.      Interventions:  -Stat [place] of Care:     Event Summary:   MD Notified:  Call Time: 2334 Arrival Time: 2336 End Time:  Diane Done, RN

## 2020-11-21 NOTE — Progress Notes (Signed)
PROGRESS NOTE    [name]  [id number] DOB: May 08, 1961 DOA: 11/08/2020 PCP: Clovia Cuff, MD   Brief Narrative: [name] is a 60 y.o. female with a history of ESRD on HD TTS, CAD, HTN, HLD, paroxysmal A. fib not on any anticoagulation secondary to [place], seizure disorder, CVA in 2015. Patient presented secondary to nausea/epigastric pain and found to have COVID-19 pneumonia not requiring oxygen in addition to left wrist fracture, presumed acute blood loss anemia.    Assessment & Plan:   Principal Problem:   Atrial fibrillation with rapid ventricular response (HCC) Active Problems:   CAD (coronary artery disease)   Seizure disorder (HCC)   Goals of care, counseling/discussion   End-stage renal disease on hemodialysis ([place])   Epigastric abdominal pain   Malnutrition of moderate degree   COVID-19 pneumonia Patient found to have bilateral peripheral opacities on chest x-ray without associated hypoxia. Patient received Remdesivir for treatment of COVID-19 infection/pneumonia. No steroids were initiated secondary to no hypoxia. Patient also was treated for Cefepime IV for 5 days.  Acute on chronic anemia Chronic component in setting of kidney disease. Unsure of etiology of acute component. Baseline appears to be around 10-11 with a drop as low as 6.5. patient received 2 units of PRBC on 1/21 with hemoglobin back up to 10.7 and has remained stable.  Constipation/Fecal impaction Resolved with bowel regimen.  Paroxysmal atrial fibrillation -Continue amiodarone. Not on anticoagulation secondary to [place] history.  ESRD on HD Nephrology consulted for HD  Right LE DVT Chronic. Not started on anticoagulation secondary to history of intracranial hemorrhage. IR consulted for consideration of IVC filter which was not recommended at the time.  Left wrist fracture Orthopedic surgery consulted. Patient underwent ORIF on 1/27. Splint in place with recommendations to follow-up for  splint/suture removal 2 weeks from 1/27.  Moderate malnutrition Dietitian consulted with recommendations for MVI and Nepro shake  Acute metabolic encephalopathy Likely multifactorial. Patient with evidence of subdural collection from CT head. Neurosurgery consulted with recommendation for conservative management.  Bilateral subdural fluid collection Associated mass effect. Neurosurgery consulted as mentioned above with recommendation for non-operative management  Left parietal lobe hypodensity Concerning for possible glioma. Unable to perform MRI secondary to MRI incompatible aneurysm clips. Neurosurgery recommends no treatment/follow-up.   DVT prophylaxis: SCDs Code Status:   Code Status: Full Code Family Communication: None at bedside Disposition Plan: Discharge to SNF when bed is available.   Consultants:   PCCM  Nephrology  Orthopedic surgery  Gastroenterology  Procedures:   ORIF (1/27)  Antimicrobials:  Vancomycin  Remdesivir  Levaquin  Cefepime  Cefazolin    Subjective: No concerns today.  Objective: Vitals:   11/21/20 1255 11/21/20 1405 11/21/20 1555 11/21/20 1600  BP:   107/69 105/68  Pulse:      Resp:  (!) 22 (!) 21   Temp:      TempSrc:      SpO2: 94%     Weight:        Intake/Output Summary (Last 24 hours) at 11/21/2020 1841 Last data filed at 11/21/2020 1729 Gross per 24 hour  Intake 717 ml  Output -  Net 717 ml   Filed Weights   11/19/20 0912 11/20/20 0400 11/21/20 0400  Weight: 51 kg 48.7 kg 48.5 kg    Examination:  General exam: Appears calm and comfortable Respiratory system: Clear to auscultation. Respiratory effort normal. Cardiovascular system: S1 & S2 heard, RRR. No murmurs, rubs, gallops or clicks. Gastrointestinal system: Abdomen is nondistended,  soft and nontender. No organomegaly or masses felt. Normal bowel sounds heard. Central nervous system: Alert and oriented. No focal neurological deficits. Musculoskeletal: No  edema. No calf tenderness. Left forearm in splint/ACE bandage Skin: No cyanosis. No rashes Psychiatry: Judgement and insight appear normal. Mood & affect appropriate.     Data Reviewed: I have personally reviewed following labs and imaging studies  CBC Lab Results  Component Value Date   WBC 9.8 11/21/2020   RBC 3.94 11/21/2020   HGB 11.0 (L) 11/21/2020   HCT 29.9 (L) 11/21/2020   MCV 75.9 (L) 11/21/2020   MCH 27.9 11/21/2020   PLT 163 11/21/2020   MCHC 36.8 (H) 11/21/2020   RDW 21.0 (H) 11/21/2020   LYMPHSABS 1.1 11/16/2020   MONOABS 0.6 11/16/2020   EOSABS 0.0 11/16/2020   BASOSABS 0.0 [id number]     Last metabolic panel Lab Results  Component Value Date   NA 139 11/21/2020   K 5.1 11/21/2020   CL 100 11/21/2020   CO2 24 11/21/2020   BUN 39 (H) 11/21/2020   CREATININE 6.30 (H) 11/21/2020   GLUCOSE 111 (H) 11/21/2020   GFRNONAA 7 (L) 11/21/2020   GFRAA 5 (L) 02/03/2019   CALCIUM 9.5 11/21/2020   PHOS 6.8 (H) 10/14/2020   PROT 5.5 (L) 11/16/2020   ALBUMIN 2.3 (L) 11/16/2020   BILITOT 0.7 11/16/2020   ALKPHOS 96 11/16/2020   AST 16 11/16/2020   ALT 11 11/16/2020   ANIONGAP 15 11/21/2020    CBG (last 3)  No results for input(s): GLUCAP in the last 72 hours.   GFR: Estimated Creatinine Clearance: 7.3 mL/min (A) (by C-G formula based on SCr of 6.3 mg/dL (H)).  Coagulation Profile: No results for input(s): INR, PROTIME in the last 168 hours.  No results found for this or any previous visit (from the past 240 hour(s)).      Radiology Studies: No results found.      Scheduled Meds: . amiodarone  400 mg Oral Daily   Followed by  . [START ON 11/27/2020] amiodarone  200 mg Oral Daily  . calcitRIOL  1 mcg Oral Q T,Th,Sa-HD  . Chlorhexidine Gluconate Cloth  6 each Topical Q0600  . [START ON 11/24/2020] darbepoetin (ARANESP) injection - NON-DIALYSIS  100 mcg Subcutaneous Q Sat-1800  . feeding supplement (NEPRO CARB STEADY)  237 mL Oral TID BM  .  levETIRAcetam  500 mg Oral BID  . methimazole  10 mg Oral BID  . midodrine  10 mg Oral BID WC  . multivitamin  1 tablet Oral QHS  . pantoprazole  40 mg Oral BID  . polyethylene glycol  17 g Oral BID  . rosuvastatin  10 mg Oral Daily   Continuous Infusions:   LOS: 12 days     Cordelia Poche, MD [facility] 11/21/2020, 6:41 PM  If 7PM-7AM, please contact night-coverage [email or web address]

## 2020-11-21 NOTE — Progress Notes (Signed)
Physical Therapy Treatment Patient Details Name: [name] MRN: [id number] DOB: 1961/06/03 Today's Date: 11/21/2020    History of Present Illness 60 y.o. female with medical history significant of ESRD on HD TTS, CAD, hypertension, hyperlipidemia, paroxysmal A. 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 Found to be in A-fib with RVR, unvaccinated, COVID+, CT showing small L pleural effusion at the L lung base. Reports L wrist pain from daughter falling on her wrist prior to coming to ED found to have fx and  L UE wrist splint applied in ED. NWB will eventually require ORIF. Rapid response 11/09/20 transfer to ICU, placement of untunneled R femoral central venous catheter, D/c'd 11/11/20    PT Comments    Pt was seen for progression to chair today, after doing ROM on L shoulder with pillow to protect L UE.  Her sling request was presented to staff, will hopefully make her more comfortable as she resumes walking.  Pt has been in the bed a great deal, required mod assist to get to the chair.  Follow along with her to encourage gait and longer standing times, to increase LE strength and challenge standing balance as tolerated, all with monitoring of O2 sats.   Follow Up Recommendations  SNF     Equipment Recommendations  3in1 (PT)    Recommendations for Other Services       Precautions / Restrictions Precautions Precautions: Fall Precaution Comments: NWB on LUE Required Braces or Orthoses: Splint/Cast Splint/Cast: L wrist volar splint Restrictions Weight Bearing Restrictions: Yes LUE Weight Bearing: Non weight bearing Other Position/Activity Restrictions: R untunneled femoral cath no flexion past 90 degrees    Mobility  Bed Mobility Overal bed mobility: Needs Assistance Bed Mobility: Supine to  Sit Rolling: Min assist   Supine to sit: Min assist     General bed mobility comments: doesnt require a lot of help but will not initiate very much  Transfers Overall transfer level: Needs assistance Equipment used: 1 person hand held assist Transfers: Sit to/from [facility] Sit to Stand: Mod assist Stand pivot transfers: Mod assist          Ambulation/Gait             General Gait Details: unable to attempt based on standing and mobility   Stairs             Wheelchair Mobility    Modified Rankin (Stroke Patients Only)       Balance     Sitting balance-Leahy Scale: Fair Sitting balance - Comments: pt will lean to R side or L side with no warning     Standing balance-Leahy Scale: Poor Standing balance comment: requires direct assist and pt is leaning her head onPT                            Cognition Arousal/Alertness: Awake/alert Behavior During Therapy: Flat affect Overall Cognitive Status: Impaired/Different from baseline Area of Impairment: Problem solving;Awareness;Safety/judgement;Following commands;Attention                   Current Attention Level: Selective Memory: Decreased recall of precautions;Decreased short-term memory Following Commands: Follows one step commands inconsistently;Follows one step commands with increased time Safety/Judgement: Decreased awareness of safety;Decreased awareness of deficits Awareness: Intellectual Problem Solving: Slow processing;Requires verbal  cues;Requires tactile cues General Comments: pt was willing to let PT assist her to stand but had to directly stand in front of her due to length of time since pt has been OOB to chair      Exercises General Exercises - Lower Extremity Ankle Circles/Pumps: AAROM;5 reps    General Comments General comments (skin integrity, edema, etc.): Pt is up to chair with alarm pad and positioned with legs not flexing at hips past  90degrees      Pertinent Vitals/Pain Pain Assessment: Faces Faces Pain Scale: Hurts little more Pain Location: LUE, not as visibly painful to pt Pain Descriptors / Indicators: Guarding    Home Living                      Prior Function            PT Goals (current goals can now be found in the care plan section) Acute Rehab PT Goals Patient Stated Goal: to get home    Frequency    Min 2X/week      PT Plan Current plan remains appropriate    Co-evaluation              AM-PAC PT "6 Clicks" Mobility   Outcome Measure  Help needed turning from your back to your side while in a flat bed without using bedrails?: A Little Help needed moving from lying on your back to sitting on the side of a flat bed without using bedrails?: A Little Help needed moving to and from a bed to a chair (including a wheelchair)?: A Lot Help needed standing up from a chair using your arms (e.g., wheelchair or bedside chair)?: A Lot Help needed to walk in hospital room?: A Lot Help needed climbing 3-5 steps with a railing? : A Lot 6 Click Score: 14    End of Session   Activity Tolerance: Patient limited by lethargy;Patient limited by fatigue Patient left: in chair;with call bell/phone within reach;with chair alarm set Nurse Communication: Mobility status PT Visit Diagnosis: Muscle weakness (generalized) (M62.81);Other abnormalities of gait and mobility (R26.89);Pain;Difficulty in walking, not elsewhere classified (R26.2) Pain - Right/Left: Left Pain - part of body: Hand;Arm     Time: 1137-1209 PT Time Calculation (min) (ACUTE ONLY): 32 min  Charges:  $Therapeutic Activity: 8-22 mins $Neuromuscular Re-education: 8-22 mins                 Ramond Dial 11/21/2020, 1:09 PM  Mee Hives, PT MS Acute Rehab Dept. Number: [place] and [place]

## 2020-11-21 NOTE — Progress Notes (Signed)
Patient ID: [name], female   DOB: [birth year], 60 y.o.   MRN: [id number] BP 105/68   Pulse 84   Temp 97.9 F (36.6 C) (Oral)   Resp (!) 21   Wt 48.5 kg   SpO2 94%   BMI 20.20 kg/m  Lethargic, will follow some commands Frail, cachectic Perrl, full eom Symmetric facies No neurological change. No new recommendations

## 2020-11-21 NOTE — Plan of Care (Signed)
  Problem: Education: Goal: Knowledge of General Education information will improve Description Including pain rating scale, medication(s)/side effects and non-pharmacologic comfort measures Outcome: Progressing   

## 2020-11-21 NOTE — Progress Notes (Signed)
Patient ID: [name], female   DOB: 1961-01-04, 60 y.o.   MRN: [id number] S: Feels a little weak this morning but more awake and alert O:BP 110/85 (BP Location: Right Arm)   Pulse 84   Temp 98 F (36.7 C) (Oral)   Resp 18   Wt 48.5 kg   SpO2 93%   BMI 20.20 kg/m   Intake/Output Summary (Last 24 hours) at 11/21/2020 1003 Last data filed at 11/21/2020 [id number] Gross per 24 hour  Intake 837 ml  Output -  Net 837 ml   Intake/Output: I/O last 3 completed shifts: In: 837 [P.O.:600; NG/GT:237] Out: 1 [Stool:1]  Intake/Output this shift:  Total I/O In: 120 [P.O.:120] Out: -  Weight change: -2.5 kg Gen: chronically ill-appearing CVS: RRR Resp: cta Abd: benign Ext: no edema, RUE AVF +T/B, RIJ [facility]  Recent Labs  Lab 11/15/20 1413 11/15/20 2033 11/16/20 1902 11/21/20 0516  NA 145 134* 131* 139  K 3.0* 4.5 5.2* 5.1  CL  --  96* 94* 100  CO2  --  17* 17* 24  GLUCOSE  --  114* 118* 111*  BUN  --  57* 67* 39*  CREATININE  --  7.87* 8.38* 6.30*  ALBUMIN  --  2.2* 2.3*  --   CALCIUM  --  9.9 9.6 9.5  AST  --  14* 16  --   ALT  --  15 11  --    Liver Function Tests: Recent Labs  Lab 11/15/20 2033 11/16/20 1902  AST 14* 16  ALT 15 11  ALKPHOS 84 96  BILITOT 0.6 0.7  PROT 5.0* 5.5*  ALBUMIN 2.2* 2.3*   No results for input(s): LIPASE, AMYLASE in the last 168 hours. No results for input(s): AMMONIA in the last 168 hours. CBC: Recent Labs  Lab 11/15/20 2033 11/16/20 1902 11/21/20 0516  WBC 8.6 11.3* 9.8  NEUTROABS 7.0 9.5*  --   HGB 11.3* 12.0 11.0*  HCT 32.6* 33.2* 29.9*  MCV 80.5 78.5* 75.9*  PLT 326 292 163   Cardiac Enzymes: No results for input(s): CKTOTAL, CKMB, CKMBINDEX, TROPONINI in the last 168 hours. CBG: Recent Labs  Lab 11/15/20 2000 11/15/20 2300 11/16/20 0332 11/16/20 0854 11/16/20 1249  GLUCAP 130* 147* 133* 115* 112*    Iron Studies: No results for input(s): IRON, TIBC, TRANSFERRIN, FERRITIN in the last 72 hours. Studies/Results: No  results found. Marland Kitchen amiodarone  400 mg Oral Daily   Followed by  . [START ON 11/27/2020] amiodarone  200 mg Oral Daily  . calcitRIOL  1 mcg Oral Q T,Th,Sa-HD  . Chlorhexidine Gluconate Cloth  6 each Topical Q0600  . [START ON 11/24/2020] darbepoetin (ARANESP) injection - NON-DIALYSIS  100 mcg Subcutaneous Q Sat-1800  . feeding supplement (NEPRO CARB STEADY)  237 mL Oral TID BM  . levETIRAcetam  500 mg Oral BID  . methimazole  10 mg Oral BID  . midodrine  10 mg Oral BID WC  . multivitamin  1 tablet Oral QHS  . pantoprazole  40 mg Oral BID  . polyethylene glycol  17 g Oral BID  . rosuvastatin  10 mg Oral Daily    BMET    Component Value Date/Time   NA 139 11/21/2020 0516   K 5.1 11/21/2020 0516   CL 100 11/21/2020 0516   CO2 24 11/21/2020 0516   GLUCOSE 111 (H) 11/21/2020 0516   BUN 39 (H) 11/21/2020 0516   CREATININE 6.30 (H) 11/21/2020 0516   CALCIUM 9.5 11/21/2020 0516  GFRNONAA 7 (L) 11/21/2020 0516   GFRAA 5 (L) 02/03/2019 0652   CBC    Component Value Date/Time   WBC 9.8 11/21/2020 0516   RBC 3.94 11/21/2020 0516   HGB 11.0 (L) 11/21/2020 0516   HCT 29.9 (L) 11/21/2020 0516   PLT 163 11/21/2020 0516   MCV 75.9 (L) 11/21/2020 0516   MCH 27.9 11/21/2020 0516   MCHC 36.8 (H) 11/21/2020 0516   RDW 21.0 (H) 11/21/2020 0516   LYMPHSABS 1.1 11/16/2020 1902   MONOABS 0.6 11/16/2020 1902   EOSABS 0.0 11/16/2020 1902   BASOSABS 0.0 11/16/2020 1902      P HD: TTS [facility] 3h 38kg 2/2 bath TDC Hep none - calc 120 ug tiw - mircera 75 q2 last 08/23/20 - velphoro 2 ac tid  - last HD 3hrs on 1/18, mostly compliant. - missed VVS post OP f/u - sp plication on [id number]   Assessment/Plan:  1. COVID -19 viral pna - hazy opacities on CXR. SP remdesivir. On IV cefepime.Is nowout of isolation on 1/31 (10days). On room air.  2. Acute on chronic anemia - Hb 9.7 >> 11.5 after 2u prbc's on 1/21. gavearanesp 100 mcg on 1/22. GI consulted, conservative measures recommended.  Not a/c candidate. 3. 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 Parox afib w/ RVR -no a/c, as above, IV amio now is po amio. Cards following 5. 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 Per primary team. 6. ESRD - HD TTS.Had HD here Sun/ Tues / Friday this week. Off schedule. Had HD 11/19/20.  Plan to get back on schedule sometime this week as schedule allows, possibly tomorrow if cannot get to her today. 7. AMS - appreciate efforts of primary team.  8. L wrist fracture - sp ORIF 1/27 by ortho 9. Volume - no vol excess on exam. CXR no edema, LLL infiltrate. Wt'sprobnot correct.  10. Metabolic bone disease - binders when taking PO. Continue calcitriol 11. GOC - very frail, multiple complicating medical problems. Per primary team consulting pall care team.  Donetta Potts, MD [facility] [phone]

## 2020-11-21 NOTE — Consult Note (Signed)
Consultation Note Date: 11/21/2020   Patient Name: [name]  DOB: 1960/12/29  MRN: [id number]  Age / Sex: 60 y.o., female  PCP: Clovia Cuff, MD Referring Physician: Mariel Aloe, MD  Reason for Consultation: Establishing goals of care  HPI/Patient Profile: 60 y.o. female  with past medical history of ESRD on hemodialysis, CAD, HTN, HLD, paroxysmal afib not on anticoagulation due to hx of ICH, VP shunt for obstructive hydrocephalus 2013, bilateral subdural fluid collection, seizure disorder, CVA 2015 admitted on 11/08/2020 with nausea, vomiting, epigastric pain. Found to have COVID-19 pneumonia along with anemia and afib RVR. Patient's respiratory status is stable following covid infection, off isolation 1/31. + for right CFV DVT unable to anticoagulate due to ongoing anemia. Repeat doppler on 1/29 shows clot has mostly resolved. IR not recommending IVC at this time. Neurosurgery following. They do not think that subdural correction is symptomatic in nature. Also ongoing chronic appearing hypodensity in the left parietal lobe which appears to be progressing and likely concerning for glioma or infection like lesion. MRI is recommended but with patient's aneurysm clips are MRI incompatible. Repeat CT scan on 1/31 shows no change in subdural hematoma. Neurosurgery recommends no further work-up or treatment. Patient with poor prognosis secondary to multiple chronic co-morbidities in addition to prolonged acute hospitalization and poor oral intake/mental status changes. Palliative medicine consultation for goals of care.   Clinical Assessment and Goals of Care:  I have reviewed medical records, discussed with care team, and visited with patient. [name] is awake but disoriented. Needs family for [place] discussion. She is resistant with working with therapy but per nursing staff, appetite improving. She enjoys nephro  shakes. No pain or discomfort. Oxygen stable on room air.   Received call back from daughter, Wilford Sports.   I introduced Palliative Medicine as specialized medical care for people living with serious illness. It focuses on providing relief from the symptoms and stress of a serious illness. The goal is to improve quality of life for both the patient and the family.  [name] shares that her mother is not married. [name] has two daughters, but unfortunately [name] is unable to contact her sister Evlyn [name] despite multiple attempts. [name] reports [name] left in August and cannot be found.   Discussed [name] of health decline starting with a brain aneurysm at age 12. Following hydrocephalus in 2013, patient was placed into nursing home. She lived there for approximately 4 years. [name] shares that her quality of life was deteriorating and [name] and Evlyn [name] agreed to move their mother out of the nursing home and jointly care for her at home. Unfortunately, [name] has minimal support since her sister is gone. [name] does have 5 children (one 28 and one 74) who do assist with caring for [name] in the home.   [name] shares her mother has done well at home until this past November with declining functional and nutritional status. It also has been more difficult to get her up and motivated to go to dialysis.  Discussed events leading up to admission and course of hospitalization including diagnoses, interventions, plan of care. Discussed concern with poor long-term prognosis secondary to multiple chronic conditions, prolonged hospitalization, and her mother's frailty with ongoing poor oral intake.   I attempted to elicit values and goals of care important to the patient and daughter. Advanced directives, concepts specific to code status, artifical feeding and hydration, and rehospitalization were considered and discussed. [name] shares this question was asked to her when her mother was  admitted and she stated "do whatever you need to do to keep my mom alive." Medically educated on consideration of limitations to care including DNR/DNI with her mother's chronic conditions, frailty, and unlikelihood these interventions would be beneficial as her health continues to decline.   [name] is tearful throughout discussion. She shares that her [name] is a Marine scientist (lives in [place]) and has started discussions with [name] about life insurance and her mother's wishes. Encouraged ongoing discussions with her aunt with these tough decisions she is faced with in the future, sharing her mother's body is frail and unfortunately we are not meant to live forever. Discussed hoping for the best and treating the treatable, but also preparing for the worst and what her wishes for her mother would be in the event of further decline. Also discussed dialysis being a form a life support and concern that at some point she will not be able to tolerate dialysis as she did when it was initiated. Emotional support provided.   [name] is appreciative of this information but trying to 'wrap my head' around the conversation. She would prefer for her mother to return home on discharge, feeling like SNF rehab would not benefit her mother's quality of life. [name] shares hopes that her mother will do better if home with family. [name] is interested in outpatient palliative f/u on discharge.   Answered many questions. Shared that I will leave Hard Choices and MOST form with her mother's belongings for her to review. Support provided. PMT contact information given.    SUMMARY OF RECOMMENDATIONS    Good initial palliative discussion with daughter, Wilford Sports. Needs ongoing support and palliative discussions.   No spouse. Patient's other daughter Evlyn [name]) has been estranged since August 2021. No documented HCPOA. Default HCPOA is [name].   Continue full code/full scope treatment. Continue dialysis.   Introduced MOST  form and concept of limitations (DNR/DNI) with her mother's multiple chronic conditions and poor prognosis. Patient's sister Delcie [name]) is a Marine scientist and has started discussions with [name]. Encouraged [name] to continue discussions with Delcie [name] regarding this patient's goals/EOL wishes.   Daughter prefers for discharge home with home health when medically stable. Daughter is interested in outpatient palliative f/u on discharge. TOC notified.   Code Status/Advance Care Planning:  Full code  Symptom Management:   Per attending  Palliative Prophylaxis:   Aspiration, Delirium Protocol, Frequent Pain Assessment, Oral Care and Turn Reposition  Psycho-social/Spiritual:   Desire for further Chaplaincy support: yes  Additional Recommendations: Caregiving  Support/Resources, Compassionate Wean Education and Education on Hospice  Prognosis:   Guarded  Discharge Planning: Home with Home Health, outpatient palliative     Primary Diagnoses: Present on Admission: . Atrial fibrillation with rapid ventricular response ([place]) . CAD (coronary artery disease)   I have reviewed the medical record, interviewed the patient and family, and examined the patient. The following aspects are pertinent.  Past Medical History:  Diagnosis Date  . Anterior cerebral artery aneurysm    1996 AT [facility]  .  CAD (coronary artery disease)    a. 2009 for NSTEMI with 70% dLAD, 30% mCx, 50% dCx, 95% dRCA, 40% PLA -> RCA was treated with DES.  . Diastolic heart failure    LVEF 60-65% with grade 2 diastolic dysfunction - July 2014  . Encephalopathy   . ESRD (end stage renal disease) on dialysis ([place])   . HTN (hypertension)    Resistant  . Hypercholesterolemia   . PAF (paroxysmal atrial fibrillation) ([place]) 02/03/2019  . Poor historian   . Seizures ([place])   . Stroke (cerebrum) ([place]) 2015  . Subarachnoid hemorrhage ([place])    03/2012  . Tobacco abuse    Resumed smoking half a pack a day. She was a smoker in the past and  states she was abl eto stop in the past using nicotine patches   Social History   Socioeconomic History  . Marital status: Single    Spouse name: Not on file  . Number of children: Not on file  . Years of education: Not on file  . Highest education level: Not on file  Occupational History  . Not on file  Tobacco Use  . Smoking status: Former Smoker    Packs/day: 0.20    Years: 35.00    Pack years: 7.00    Types: Cigarettes    Quit date: 03/20/2017    Years since quitting: 3.6  . Smokeless tobacco: Never Used  . Tobacco comment: 1/2 ppd   Vaping Use  . Vaping Use: Never used  Substance and Sexual Activity  . Alcohol use: No  . Drug use: No  . Sexual activity: Never  Other Topics Concern  . Not on file  Social History Narrative   Lives by herself but helps care for her 8 grandchildren.    Social Determinants of Health   Financial Resource Strain: Medium Risk  . Difficulty of Paying Living Expenses: Somewhat hard  Food Insecurity: No Food Insecurity  . Worried About Charity fundraiser in the Last Year: Never true  . Ran Out of Food in the Last Year: Never true  Transportation Needs: Unmet Transportation Needs  . Lack of Transportation (Medical): Yes  . Lack of Transportation (Non-Medical): Yes  Physical Activity: Not on file  Stress: Not on file  Social Connections: Not on file   Family History  Problem Relation Age of Onset  . Heart disease Mother   . Heart disease Father   . Coronary artery disease Other        Premature in 1st degree relatives   Scheduled Meds: . amiodarone  400 mg Oral Daily   Followed by  . [START ON 11/27/2020] amiodarone  200 mg Oral Daily  . calcitRIOL  1 mcg Oral Q T,Th,Sa-HD  . Chlorhexidine Gluconate Cloth  6 each Topical Q0600  . [START ON 11/24/2020] darbepoetin (ARANESP) injection - NON-DIALYSIS  100 mcg Subcutaneous Q Sat-1800  . feeding supplement (NEPRO CARB STEADY)  237 mL Oral TID BM  . levETIRAcetam  500 mg Oral BID  .  methimazole  10 mg Oral BID  . midodrine  10 mg Oral BID WC  . multivitamin  1 tablet Oral QHS  . pantoprazole  40 mg Oral BID  . polyethylene glycol  17 g Oral BID  . rosuvastatin  10 mg Oral Daily   Continuous Infusions: PRN Meds:.[DISCONTINUED] acetaminophen **OR** acetaminophen, acetaminophen, sodium chloride flush Medications Prior to Admission:  Prior to Admission medications   Medication Sig Start Date End Date Taking?  Authorizing Provider  aspirin 81 MG EC tablet Take 81 mg by mouth every 8 (eight) hours as needed for pain. 12/31/17  Yes [provider]  calcium acetate (PHOSLO) 667 MG capsule Take 667 mg by mouth 3 (three) times daily. 11/28/19  Yes [provider]  metoprolol tartrate (LOPRESSOR) 25 MG tablet Take 1 tablet (25 mg total) by mouth 2 (two) times daily. 10/16/20  Yes Annita Brod, MD  rosuvastatin (CRESTOR) 10 MG tablet Take 10 mg by mouth daily. 10/16/20  Yes [provider]  amiodarone (PACERONE) 200 MG tablet Take 2 tabs ('400mg'$ ) daily for 7 days, then 1 tab ('200mg'$ ) daily thereafter Patient not taking: Reported on 11/09/2020 03/24/18   Mendel Corning, MD  calcitRIOL (ROCALTROL) 0.5 MCG capsule Take 1 capsule (0.5 mcg total) by mouth Every Tuesday,Thursday,and Saturday with dialysis. Patient not taking: Reported on 11/09/2020 02/05/19   Modena Jansky, MD  nitroGLYCERIN (NITROSTAT) 0.4 MG SL tablet Place 1 tablet (0.4 mg total) under the tongue every 5 (five) minutes as needed for chest pain. Patient not taking: Reported on 11/09/2020 05/18/13   Janece Canterbury, MD  sucroferric oxyhydroxide (VELPHORO) 500 MG chewable tablet Chew 2 tablets (1,000 mg total) by mouth 3 (three) times daily with meals. Patient not taking: Reported on 11/09/2020 02/04/19   Modena Jansky, MD   Allergies  Allergen Reactions  . Amoxicillin Hives and Rash    Informed by pt's daughter [name]   . Penicillins Hives and Rash    Has patient had a PCN  reaction causing immediate rash, facial/tongue/throat swelling, SOB or lightheadedness with hypotension: Yes Has patient had a PCN reaction causing severe rash involving mucus membranes or skin necrosis: No Has patient had a PCN reaction that required hospitalization: No Has patient had a PCN reaction occurring within the last 10 years: No If all of the above answers are "NO", then may proceed with Cephalosporin use. PATIENT TOLERATED CEFAZOLIN IN OR 11/15/20    Review of Systems  Unable to perform ROS: Mental status change    Physical Exam Vitals and nursing note reviewed.  Constitutional:      General: She is awake.  HENT:     Head: Normocephalic and atraumatic.  Pulmonary:     Effort: No tachypnea, accessory muscle usage or respiratory distress.  Neurological:     Mental Status: She is alert.     Comments: Alert, confused  Psychiatric:        Speech: Speech is delayed.        Cognition and Memory: Cognition is impaired.    Vital Signs: BP 105/68   Pulse 84   Temp 97.9 F (36.6 C) (Oral)   Resp (!) 21   Wt 48.5 kg   SpO2 94%   BMI 20.20 kg/m  Pain Scale: 0-10 POSS *See Group Information*: 1-Acceptable,Awake and alert Pain Score: 0-No pain   SpO2: SpO2: 94 % O2 Device:SpO2: 94 % O2 Flow Rate: .O2 Flow Rate (L/min): 2 L/min  IO: Intake/output summary:   Intake/Output Summary (Last 24 hours) at 11/21/2020 1733 Last data filed at 11/21/2020 1729 Gross per 24 hour  Intake 717 ml  Output -  Net 717 ml    LBM: Last BM Date: 11/21/20 Baseline Weight: Weight: 44.8 kg Most recent weight: Weight: 48.5 kg     Palliative Assessment/Data: PPS 50%   Flowsheet Rows   Flowsheet Row Most Recent Value  Intake Tab   Referral Department Hospitalist  Unit at Time of  Referral Cardiac/Telemetry Unit  Palliative Care Primary Diagnosis Other (Comment)  Palliative Care Type New Palliative care  Reason for referral Clarify Goals of Care  Date first seen by Palliative Care  11/19/20  Clinical Assessment   Palliative Performance Scale Score 50%  Psychosocial & Spiritual Assessment   Palliative Care Outcomes   Patient/Family meeting held? Yes  Who was at the meeting? daughter [name])  [place] goals of care, Provided end of life care assistance, Provided psychosocial or spiritual support, ACP counseling assistance, Linked to palliative care logitudinal support      Time Total: 33 Greater than 50%  of this time was spent counseling and coordinating care related to the above assessment and plan.  Signed by:  Ihor Dow, DNP, FNP-C Palliative Medicine Team  Phone: [phone] Fax: [phone]   Please contact Palliative Medicine Team phone at [phone] for questions and concerns.  For individual provider: See Shea Evans

## 2020-11-22 DIAGNOSIS — E440 Moderate protein-calorie malnutrition: Secondary | ICD-10-CM | POA: Diagnosis not present

## 2020-11-22 DIAGNOSIS — I4891 Unspecified atrial fibrillation: Secondary | ICD-10-CM | POA: Diagnosis not present

## 2020-11-22 DIAGNOSIS — R1013 Epigastric pain: Secondary | ICD-10-CM | POA: Diagnosis not present

## 2020-11-22 LAB — GLUCOSE, CAPILLARY: Glucose-Capillary: 119 mg/dL — ABNORMAL HIGH (ref 70–99)

## 2020-11-22 MED ORDER — HEPARIN SODIUM (PORCINE) 1000 UNIT/ML IJ SOLN
INTRAMUSCULAR | Status: AC
  Filled 2020-11-22: qty 3

## 2020-11-22 NOTE — Progress Notes (Signed)
Pt arrived on the unit on RA and as reported; Pt noted tachypneic RR=36; placed pt on 2L of O2; pt still noted to have laboured breathing, O2 increased to 3L. O2 sats are 99% at the beginning of HD tx. Labored breathing continuing and pt becoming restless RR=40; Called Rapid response; HR increased 140-160's; Irma Newness, Rapid Response RN arrived in the unit and ordered a chest xray which he stated showed pulmorary edema. Pt resting well now @ 2350 HR=120s-130s.

## 2020-11-22 NOTE — Progress Notes (Signed)
Occupational Therapy Treatment Patient Details Name: [name] MRN: [id number] DOB: 1960-12-08 Today's Date: 11/22/2020    History of present illness 60 y.o. female with medical history significant of ESRD on HD TTS, CAD, hypertension, hyperlipidemia, paroxysmal A. 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 Found to be in A-fib with RVR, unvaccinated, COVID+, CT showing small L pleural effusion at the L lung base. Reports L wrist pain from daughter falling on her wrist prior to coming to ED found to have fx and  L UE wrist splint applied in ED. NWB will eventually require ORIF. Rapid response 11/09/20 transfer to ICU, placement of untunneled R femoral central venous catheter, D/c'd 11/11/20   OT comments  Pt received slightly lethargic and groaning, likely due to discomfort, however, when asked reports otherwise. Pt received with BM, total A pericare with RN assisting with changing of soiled linen. Min A supine to sit EOB with cueing to place LUE around waist and reaching for grab with RUE to assist with trunk elevation. Pt is still self limiting and required max encouragement. Becomes really anxious. RN administered meds to address HR which was elevated ( provided below). 2 HHA for sit to stand and stand pivot for simulated toilet transfer from bed to recliner. Pt will benefit to continued acute OT to address established deficits and to maximize independence prior to dc. DC and freq remains the same.    Follow Up Recommendations  SNF;Supervision/Assistance - 24 hour    Equipment Recommendations  3 in 1 bedside commode    Recommendations for Other Services      Precautions / Restrictions Precautions Precautions: Fall Precaution Comments: NWB on LUE Required Braces or Orthoses: Splint/Cast Splint/Cast: L  wrist volar splint Restrictions Weight Bearing Restrictions: Yes LUE Weight Bearing: Non weight bearing Other Position/Activity Restrictions: R untunneled femoral cath no flexion past 90 degrees       Mobility Bed Mobility Overal bed mobility: Needs Assistance Bed Mobility: Supine to Sit Rolling: Min assist   Supine to sit: Min assist     General bed mobility comments: doesnt require a lot of help but will not initiate very much. Cues for placing LUE on waist to adhere to NWB status. Cue hand placement to reach for grab  bar with R hand to elevate trunk to sitting.  Transfers Overall transfer level: Needs assistance Equipment used: 2 person hand held assist Transfers: Sit to/from [facility] Sit to Stand: Mod assist Stand pivot transfers: Mod assist       General transfer comment: Able to present to chair with encouragement due to changing of soiled linens. Pt really hesitant and self limiting throughout engagement.    Balance Overall balance assessment: Needs assistance;History of Falls   Sitting balance-Leahy Scale: Fair Sitting balance - Comments: pt will lean to R side or L side with no warning   Standing balance support: Single extremity supported Standing balance-Leahy Scale: Poor Standing balance comment: requires direct assist and pt is leaning her head onPT                           ADL either performed or assessed with clinical judgement   ADL Overall ADL's : Needs assistance/impaired  Toilet Transfer: Moderate assistance Toilet Transfer Details (indicate cue type and reason): simulated transfer with sit to stand transition from EOB, decreased tolerance. OT assisted with LUE due to NWB and immobilized in splint. Toileting- Clothing Manipulation and Hygiene: Total assistance;Bed level Toileting - Clothing Manipulation Details (indicate cue type and reason): Pt with BM in bed and unaware of how to use  call bell to ask for assistance. Pt observed with bowel incontinence, with simulated toilet transfer from bed to recliner, 2 hand held assist. Pt is observed with really weak and requires encouragement to sit in chair and OOB.     Functional mobility during ADLs: Moderate assistance (2 HHA) General ADL Comments: HR at 99-06 range throughout session currently 1L O2, removed with stable O2 sats at 93-94%. Session limited due to BM and pericare with changing of linens.     Vision   Vision Assessment?: No apparent visual deficits   Perception     Praxis      Cognition Arousal/Alertness: Awake/alert Behavior During Therapy: Flat affect Overall Cognitive Status: Impaired/Different from baseline Area of Impairment: Problem solving;Awareness;Safety/judgement;Following commands;Attention                 Orientation Level: Situation;Time Current Attention Level: Selective Memory: Decreased recall of precautions;Decreased short-term memory Following Commands: Follows one step commands inconsistently;Follows one step commands with increased time Safety/Judgement: Decreased awareness of safety;Decreased awareness of deficits Awareness: Intellectual Problem Solving: Slow processing;Requires verbal cues;Requires tactile cues          Exercises     Shoulder Instructions       General Comments edema of LUE still observed, built up pillow provided for elevation.    Pertinent Vitals/ Pain       Pain Assessment: Faces Faces Pain Scale: Hurts even more Pain Location: LUE, not as visibly painful to pt Pain Descriptors / Indicators: Guarding Pain Intervention(s): Limited activity within patient's tolerance;Monitored during session;Repositioned  Home Living                                          Prior Functioning/Environment              Frequency  Min 2X/week        Progress Toward Goals  OT Goals(current goals can now be found in the care plan  section)  Progress towards OT goals: Progressing toward goals  Acute Rehab OT Goals Patient Stated Goal: to get home OT Goal Formulation: Patient unable to participate in goal setting Time For Goal Achievement: 11/26/20 Potential to Achieve Goals: Poor ADL Goals Pt Will Perform Grooming: with set-up;sitting Pt Will Perform Lower Body Dressing: with min guard assist;sit to/from stand Pt Will Transfer to Toilet: with min assist;ambulating;bedside commode Pt Will Perform Toileting - Clothing Manipulation and hygiene: with min assist;sit to/from stand Pt/caregiver will Perform Home Exercise Program: Increased strength;Right Upper extremity;With Supervision Additional ADL Goal #1: Pt will participate in higher level cognitive tasks to increase awareness and to continue to assess deficits.  Plan Discharge plan remains appropriate;Frequency remains appropriate    Co-evaluation                 AM-PAC OT "6 Clicks" Daily Activity     Outcome Measure   Help from another person eating meals?: A Little Help from another person taking care of personal grooming?: A Lot Help from another person toileting, which includes using  toliet, bedpan, or urinal?: Total Help from another person bathing (including washing, rinsing, drying)?: A Lot Help from another person to put on and taking off regular upper body clothing?: A Lot Help from another person to put on and taking off regular lower body clothing?: A Lot 6 Click Score: 12    End of Session Equipment Utilized During Treatment: Gait belt  OT Visit Diagnosis: Muscle weakness (generalized) (M62.81);Pain   Activity Tolerance Patient limited by pain;Patient limited by fatigue   Patient Left with call bell/phone within reach;in chair;with chair alarm set;with nursing/sitter in room   Nurse Communication Mobility status        Time: [id number] OT Time Calculation (min): 28 min  Charges: OT General Charges $OT Visit: 1 Visit OT  Treatments $Self Care/Home Management : 23-37 mins  Minus Breeding, MSOT, OTR/L  Supplemental Rehabilitation Services  [phone]    Marius Ditch 11/22/2020, 11:59 AM

## 2020-11-22 NOTE — Progress Notes (Signed)
   11/22/20 0245  Assess: MEWS Score  Temp 98.2 F (36.8 C)  BP (!) 141/87  Pulse Rate (!) 114  ECG Heart Rate (!) 114  Resp (!) 25  Level of Consciousness Alert  SpO2 100 %  O2 Device Nasal Cannula  O2 Flow Rate (L/min) 3 L/min  Assess: MEWS Score  MEWS Temp 0  MEWS Systolic 0  MEWS Pulse 2  MEWS RR 1  MEWS LOC 0  MEWS Score 3  MEWS Score Color Yellow  Assess: if the MEWS score is Yellow or Red  Were vital signs taken at a resting state? No  Focused Assessment No change from prior assessment  Early Detection of Sepsis Score *See Row Information* Low  MEWS guidelines implemented *See Row Information* Yes  Treat  MEWS Interventions Other (Comment)  Pain Scale 0-10  Pain Score 0  Take Vital Signs  Increase Vital Sign Frequency  Yellow: Q 2hr X 2 then Q 4hr X 2, if remains yellow, continue Q 4hrs  Escalate  MEWS: Escalate Yellow: discuss with charge nurse/RN and consider discussing with provider and RRT  Notify: Charge Nurse/RN  Name of Charge Nurse/RN Notified [place] RN  Date Charge Nurse/RN Notified 11/22/20  Time Charge Nurse/RN Notified 0254

## 2020-11-22 NOTE — Progress Notes (Signed)
Patient ID: [name], female   DOB: 1961/10/08, 60 y.o.   MRN: [id number] S: Very lethargic this morning.  Has breakfast tray in front of her but "I don't want it" O:BP (!) 146/72   Pulse 93   Temp 98.3 F (36.8 C) (Oral)   Resp (!) 22   Wt 47.5 kg   SpO2 99%   BMI 19.79 kg/m   Intake/Output Summary (Last 24 hours) at 11/22/2020 0900 Last data filed at 11/22/2020 0600 Gross per 24 hour  Intake 300 ml  Output 1500 ml  Net -1200 ml   Intake/Output: I/O last 3 completed shifts: In: [id number] [P.O.:540; NG/GT:237] Out: 1500 [Other:1500]  Intake/Output this shift:  No intake/output data recorded. Weight change: 0.9 kg Gen: chronically ill-appearing, lethargic but verbal CVS: RRR Resp: CTA Abd: +BS, soft, NT/ND Ext: no edema, RUE AVF +T/B  Recent Labs  Lab 11/15/20 1413 11/15/20 2033 11/16/20 1902 11/21/20 0516  NA 145 134* 131* 139  K 3.0* 4.5 5.2* 5.1  CL  --  96* 94* 100  CO2  --  17* 17* 24  GLUCOSE  --  114* 118* 111*  BUN  --  57* 67* 39*  CREATININE  --  7.87* 8.38* 6.30*  ALBUMIN  --  2.2* 2.3*  --   CALCIUM  --  9.9 9.6 9.5  AST  --  14* 16  --   ALT  --  15 11  --    Liver Function Tests: Recent Labs  Lab 11/15/20 2033 11/16/20 1902  AST 14* 16  ALT 15 11  ALKPHOS 84 96  BILITOT 0.6 0.7  PROT 5.0* 5.5*  ALBUMIN 2.2* 2.3*   No results for input(s): LIPASE, AMYLASE in the last 168 hours. No results for input(s): AMMONIA in the last 168 hours. CBC: Recent Labs  Lab 11/15/20 2033 11/16/20 1902 11/21/20 0516  WBC 8.6 11.3* 9.8  NEUTROABS 7.0 9.5*  --   HGB 11.3* 12.0 11.0*  HCT 32.6* 33.2* 29.9*  MCV 80.5 78.5* 75.9*  PLT 326 292 163   Cardiac Enzymes: No results for input(s): CKTOTAL, CKMB, CKMBINDEX, TROPONINI in the last 168 hours. CBG: Recent Labs  Lab 11/15/20 2000 11/15/20 2300 11/16/20 0332 11/16/20 0854 11/16/20 1249  GLUCAP 130* 147* 133* 115* 112*    Iron Studies: No results for input(s): IRON, TIBC, TRANSFERRIN, FERRITIN in  the last 72 hours. Studies/Results: DG Chest Port 1 View  Result Date: 11/22/2020 CLINICAL DATA:  Acute respiratory distress.  Rapid response. EXAM: PORTABLE CHEST 1 VIEW COMPARISON:  Radiograph 11/10/2020 FINDINGS: Dual lead right-sided dialysis catheter remains in place. Implanted limited loop recorder projects over the left chest wall. Presumed shunt catheter tubing projects over the right hemithorax. Small bore catheter projects over the supraclavicular soft tissues on the left. Progressive bilateral pleural effusions from prior exam, right greater than left. Development of pulmonary edema. Cardiomegaly is grossly similar. Dense aortic atherosclerosis. No pneumothorax. IMPRESSION: 1. Bilateral pleural effusions, moderately large on the right and small to moderate on the left. 2. Progressive pulmonary edema. 3. Chronic cardiomegaly.  Aortic Atherosclerosis (ICD10-I70.0). Electronically Signed   By: Keith Rake M.D.   On: 11/22/2020 00:01   . amiodarone  400 mg Oral Daily   Followed by  . [START ON 11/27/2020] amiodarone  200 mg Oral Daily  . calcitRIOL  1 mcg Oral Q T,Th,Sa-HD  . Chlorhexidine Gluconate Cloth  6 each Topical Q0600  . [START ON 11/24/2020] darbepoetin (ARANESP) injection - NON-DIALYSIS  100  mcg Subcutaneous Q Sat-1800  . feeding supplement (NEPRO CARB STEADY)  237 mL Oral TID BM  . heparin sodium (porcine)      . levETIRAcetam  500 mg Oral BID  . methimazole  10 mg Oral BID  . midodrine  10 mg Oral BID WC  . multivitamin  1 tablet Oral QHS  . pantoprazole  40 mg Oral BID  . polyethylene glycol  17 g Oral BID  . rosuvastatin  10 mg Oral Daily    BMET    Component Value Date/Time   NA 139 11/21/2020 0516   K 5.1 11/21/2020 0516   CL 100 11/21/2020 0516   CO2 24 11/21/2020 0516   GLUCOSE 111 (H) 11/21/2020 0516   BUN 39 (H) 11/21/2020 0516   CREATININE 6.30 (H) 11/21/2020 0516   CALCIUM 9.5 11/21/2020 0516   GFRNONAA 7 (L) 11/21/2020 0516   GFRAA 5 (L) 02/03/2019  0652   CBC    Component Value Date/Time   WBC 9.8 11/21/2020 0516   RBC 3.94 11/21/2020 0516   HGB 11.0 (L) 11/21/2020 0516   HCT 29.9 (L) 11/21/2020 0516   PLT 163 11/21/2020 0516   MCV 75.9 (L) 11/21/2020 0516   MCH 27.9 11/21/2020 0516   MCHC 36.8 (H) 11/21/2020 0516   RDW 21.0 (H) 11/21/2020 0516   LYMPHSABS 1.1 11/16/2020 1902   MONOABS 0.6 11/16/2020 1902   EOSABS 0.0 11/16/2020 1902   BASOSABS 0.0 11/16/2020 1902    OP HD: TTS [facility] 3h 38kg 2/2 bath TDC Hep none - calc 120 ug tiw - mircera 75 q2 last 08/23/20 - velphoro 2 ac tid  - last HD 3hrs on 1/18, mostly compliant. - missed VVS post OP f/u - sp plication on [id number]   Assessment/Plan:  1. COVID -19 viral pna - hazy opacities on CXR. SP remdesivir. On IV cefepime.Is nowout of isolation on 1/31 (10days). On room air.  2. Acute on chronic anemia - Hb 9.7 >> 11.5 after 2u prbc's on 1/21. gavearanesp 100 mcg on 1/22. GI consulted, conservative measures recommended. Not a/c candidate. 3. 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 Parox afib w/ RVR -no a/c, as above, IV amio now is po amio. Cards following 5. 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 Per primary team. 6. ESRD - HD TTS.Had HD here Sun/ Tues / Friday this week. Off schedule. Had HD 11/19/20. Plan to get back on schedule today 7. AMS - appreciate efforts of primary team.  8. L wrist fracture - sp ORIF 1/27 by ortho 9. Volume - no vol excess on exam. CXR no edema, LLL infiltrate. Wt'sprobnot correct.  10. Metabolic bone disease - binders when taking PO. Continue calcitriol 11. GOC - very frail, multiple complicating medical problems. Per primary team consulting pall care team.   Donetta Potts, MD [facility] [phone]

## 2020-11-22 NOTE — Progress Notes (Signed)
PROGRESS NOTE    [name]  [id number] DOB: 1961/06/08 DOA: 11/08/2020 PCP: Clovia Cuff, MD   Brief Narrative: [name] is a 60 y.o. female with a history of ESRD on HD TTS, CAD, HTN, HLD, paroxysmal A. fib not on any anticoagulation secondary to [place], seizure disorder, CVA in 2015. Patient presented secondary to nausea/epigastric pain and found to have COVID-19 pneumonia not requiring oxygen in addition to left wrist fracture, presumed acute blood loss anemia.    Assessment & Plan:   Principal Problem:   Atrial fibrillation with rapid ventricular response (HCC) Active Problems:   CAD (coronary artery disease)   Seizure disorder (HCC)   Goals of care, counseling/discussion   End-stage renal disease on hemodialysis ([place])   Epigastric abdominal pain   Malnutrition of moderate degree   COVID-19 pneumonia Patient found to have bilateral peripheral opacities on chest x-ray without associated hypoxia. Patient received Remdesivir for treatment of COVID-19 infection/pneumonia. No steroids were initiated secondary to no hypoxia. Patient also was treated for Cefepime IV for 5 days.  Acute on chronic anemia Chronic component in setting of kidney disease. Unsure of etiology of acute component. Baseline appears to be around 10-11 with a drop as low as 6.5. patient received 2 units of PRBC on 1/21 with hemoglobin back up to 10.7 and has remained stable.  Acute respiratory distress On 2/2, patient developed respiratory distress with respiratory rate to the mid-high 30s requiring placement of oxygen via [place]. Chest x-ray significant for pleural effusions in addition to pulmonary edema. Symptoms improved overnight. She did not receive lasix. -Wean to room air as able -Fluid management per nephrology and HD  Constipation/Fecal impaction Resolved with bowel regimen.  Paroxysmal atrial fibrillation -Continue amiodarone. Not on anticoagulation secondary to [place] history.  ESRD on  HD Nephrology consulted for HD  Right LE DVT Chronic. Not started on anticoagulation secondary to history of intracranial hemorrhage. IR consulted for consideration of IVC filter which was not recommended at the time.  Left wrist fracture Orthopedic surgery consulted. Patient underwent ORIF on 1/27. Splint in place with recommendations to follow-up for splint/suture removal 2 weeks from 1/27.  Moderate malnutrition Dietitian consulted with recommendations for MVI and Nepro shake  Acute metabolic encephalopathy Likely multifactorial. Patient with evidence of subdural collection from CT head. Neurosurgery consulted with recommendation for conservative management.  Bilateral subdural fluid collection Associated mass effect. Neurosurgery consulted as mentioned above with recommendation for non-operative management  Left parietal lobe hypodensity Concerning for possible glioma. Unable to perform MRI secondary to MRI incompatible aneurysm clips. Neurosurgery recommends no treatment/follow-up.   DVT prophylaxis: SCDs Code Status:   Code Status: Full Code Family Communication: None at bedside Disposition Plan: Discharge to SNF when bed is available.   Consultants:   PCCM  Nephrology  Orthopedic surgery  Gastroenterology  Procedures:   ORIF (1/27)  Antimicrobials:  Vancomycin  Remdesivir  Levaquin  Cefepime  Cefazolin    Subjective: No concerns per patient. No dyspnea or chest pain.  Objective: Vitals:   11/22/20 0200 11/22/20 0245 11/22/20 0400 11/22/20 0750  BP:  (!) 141/87 134/85 (!) 146/72  Pulse: (!) 132 (!) 114 93   Resp: (!) 28 (!) 25 (!) 22   Temp:  98.2 F (36.8 C) 98.3 F (36.8 C)   TempSrc:  Oral Oral   SpO2: 100% 100% 99%   Weight:        Intake/Output Summary (Last 24 hours) at 11/22/2020 0855 Last data filed at 11/22/2020 0600 Gross  per 24 hour  Intake 300 ml  Output 1500 ml  Net -1200 ml   Filed Weights   11/21/20 0400 11/21/20 2306  11/22/20 0144  Weight: 48.5 kg 49.4 kg 47.5 kg    Examination:  General exam: Appears calm and comfortable Respiratory system: Clear to auscultation. Respiratory effort normal. Cardiovascular system: S1 & S2 heard, RRR. No murmurs, rubs, gallops or clicks. Gastrointestinal system: Abdomen is nondistended, soft and nontender. No organomegaly or masses felt. Normal bowel sounds heard. Central nervous system: Alert and oriented to person and place Musculoskeletal: No edema. No calf tenderness Skin: No cyanosis. No rashes Psychiatry: Judgement and insight appear impaired. Depressed mood. Flat affect   Data Reviewed: I have personally reviewed following labs and imaging studies  CBC Lab Results  Component Value Date   WBC 9.8 11/21/2020   RBC 3.94 11/21/2020   HGB 11.0 (L) 11/21/2020   HCT 29.9 (L) 11/21/2020   MCV 75.9 (L) 11/21/2020   MCH 27.9 11/21/2020   PLT 163 11/21/2020   MCHC 36.8 (H) 11/21/2020   RDW 21.0 (H) 11/21/2020   LYMPHSABS 1.1 11/16/2020   MONOABS 0.6 11/16/2020   EOSABS 0.0 11/16/2020   BASOSABS 0.0 [id number]     Last metabolic panel Lab Results  Component Value Date   NA 139 11/21/2020   K 5.1 11/21/2020   CL 100 11/21/2020   CO2 24 11/21/2020   BUN 39 (H) 11/21/2020   CREATININE 6.30 (H) 11/21/2020   GLUCOSE 111 (H) 11/21/2020   GFRNONAA 7 (L) 11/21/2020   GFRAA 5 (L) 02/03/2019   CALCIUM 9.5 11/21/2020   PHOS 6.8 (H) 10/14/2020   PROT 5.5 (L) 11/16/2020   ALBUMIN 2.3 (L) 11/16/2020   BILITOT 0.7 11/16/2020   ALKPHOS 96 11/16/2020   AST 16 11/16/2020   ALT 11 11/16/2020   ANIONGAP 15 11/21/2020    CBG (last 3)  No results for input(s): GLUCAP in the last 72 hours.   GFR: Estimated Creatinine Clearance: 7.3 mL/min (A) (by C-G formula based on SCr of 6.3 mg/dL (H)).  Coagulation Profile: No results for input(s): INR, PROTIME in the last 168 hours.  No results found for this or any previous visit (from the past 240 hour(s)).       Radiology Studies: DG Chest Port 1 View  Result Date: 11/22/2020 CLINICAL DATA:  Acute respiratory distress.  Rapid response. EXAM: PORTABLE CHEST 1 VIEW COMPARISON:  Radiograph 11/10/2020 FINDINGS: Dual lead right-sided dialysis catheter remains in place. Implanted limited loop recorder projects over the left chest wall. Presumed shunt catheter tubing projects over the right hemithorax. Small bore catheter projects over the supraclavicular soft tissues on the left. Progressive bilateral pleural effusions from prior exam, right greater than left. Development of pulmonary edema. Cardiomegaly is grossly similar. Dense aortic atherosclerosis. No pneumothorax. IMPRESSION: 1. Bilateral pleural effusions, moderately large on the right and small to moderate on the left. 2. Progressive pulmonary edema. 3. Chronic cardiomegaly.  Aortic Atherosclerosis (ICD10-I70.0). Electronically Signed   By: Keith Rake M.D.   On: 11/22/2020 00:01        Scheduled Meds: . amiodarone  400 mg Oral Daily   Followed by  . [START ON 11/27/2020] amiodarone  200 mg Oral Daily  . calcitRIOL  1 mcg Oral Q T,Th,Sa-HD  . Chlorhexidine Gluconate Cloth  6 each Topical Q0600  . [START ON 11/24/2020] darbepoetin (ARANESP) injection - NON-DIALYSIS  100 mcg Subcutaneous Q Sat-1800  . feeding supplement (NEPRO CARB STEADY)  237 mL  Oral TID BM  . heparin sodium (porcine)      . levETIRAcetam  500 mg Oral BID  . methimazole  10 mg Oral BID  . midodrine  10 mg Oral BID WC  . multivitamin  1 tablet Oral QHS  . pantoprazole  40 mg Oral BID  . polyethylene glycol  17 g Oral BID  . rosuvastatin  10 mg Oral Daily   Continuous Infusions:   LOS: 13 days     Cordelia Poche, MD [facility] 11/22/2020, 8:55 AM  If 7PM-7AM, please contact night-coverage [email or web address]

## 2020-11-23 DIAGNOSIS — R1013 Epigastric pain: Secondary | ICD-10-CM | POA: Diagnosis not present

## 2020-11-23 DIAGNOSIS — I4891 Unspecified atrial fibrillation: Secondary | ICD-10-CM | POA: Diagnosis not present

## 2020-11-23 DIAGNOSIS — E440 Moderate protein-calorie malnutrition: Secondary | ICD-10-CM | POA: Diagnosis not present

## 2020-11-23 LAB — BASIC METABOLIC PANEL
Anion gap: 17 — ABNORMAL HIGH (ref 5–15)
BUN: 45 mg/dL — ABNORMAL HIGH (ref 6–20)
CO2: 22 mmol/L (ref 22–32)
Calcium: 9.7 mg/dL (ref 8.9–10.3)
Chloride: 99 mmol/L (ref 98–111)
Creatinine, Ser: 7.13 mg/dL — ABNORMAL HIGH (ref 0.44–1.00)
GFR, Estimated: 6 mL/min — ABNORMAL LOW (ref >60)
Glucose, Bld: 136 mg/dL — ABNORMAL HIGH (ref 70–99)
Potassium: 6.3 mmol/L (ref 3.5–5.1)
Sodium: 138 mmol/L (ref 135–145)

## 2020-11-23 LAB — CBC
HCT: 33.4 % — ABNORMAL LOW (ref 36.0–46.0)
Hemoglobin: 11.2 g/dL — ABNORMAL LOW (ref 12.0–15.0)
MCH: 27.7 pg (ref 26.0–34.0)
MCHC: 33.5 g/dL (ref 30.0–36.0)
MCV: 82.5 fL (ref 80.0–100.0)
Platelets: 222 10*3/uL (ref 150–400)
RBC: 4.05 MIL/uL (ref 3.87–5.11)
RDW: 23.2 % — ABNORMAL HIGH (ref 11.5–15.5)
WBC: 13.2 10*3/uL — ABNORMAL HIGH (ref 4.0–10.5)
nRBC: 0.6 % — ABNORMAL HIGH (ref 0.0–0.2)

## 2020-11-23 MED ORDER — HEPARIN SODIUM (PORCINE) 1000 UNIT/ML IJ SOLN
INTRAMUSCULAR | Status: AC
  Filled 2020-11-23: qty 4

## 2020-11-23 NOTE — Progress Notes (Signed)
   11/23/20 0731  Assess: MEWS Score  Temp (!) 97.5 F (36.4 C)  BP 105/89  Level of Consciousness Alert  O2 Device Room Air  Patient Activity (if Appropriate) In bed  Assess: MEWS Score  MEWS Temp 0  MEWS Systolic 0  MEWS Pulse 2  MEWS RR 2  MEWS LOC 0  MEWS Score 4  MEWS Score Color Red  Assess: if the MEWS score is Yellow or Red  Were vital signs taken at a resting state? Yes  Focused Assessment No change from prior assessment  Early Detection of Sepsis Score *See Row Information* Low  MEWS guidelines implemented *See Row Information* No, previously red, continue vital signs every 4 hours  Treat  Pain Scale 0-10  Pain Score 0  Take Vital Signs  Increase Vital Sign Frequency  Red: Q 1hr X 4 then Q 4hr X 4, if remains red, continue Q 4hrs  Escalate  MEWS: Escalate Red: discuss with charge nurse/RN and provider, consider discussing with RRT  Notify: Charge Nurse/RN  Name of Charge Nurse/RN Notified [email or web address]  Date Charge Nurse/RN Notified 11/23/20  Time Charge Nurse/RN Notified [id number]  Notify: Provider  Provider Name/Title [email or web address]  Date Provider Notified 11/23/20  Time Provider Notified [phone]  Notification Type Page  Response Other (Comment) (waiting for a response)

## 2020-11-23 NOTE — Progress Notes (Signed)
PROGRESS NOTE    [name]  [id number] DOB: 02-Sep-1961 DOA: 11/08/2020 PCP: Clovia Cuff, MD   Brief Narrative: [name] is a 60 y.o. female with a history of ESRD on HD TTS, CAD, HTN, HLD, paroxysmal A. fib not on any anticoagulation secondary to [place], seizure disorder, CVA in 2015. Patient presented secondary to nausea/epigastric pain and found to have COVID-19 pneumonia not requiring oxygen in addition to left wrist fracture, presumed acute blood loss anemia.    Assessment & Plan:   Principal Problem:   Atrial fibrillation with rapid ventricular response (HCC) Active Problems:   CAD (coronary artery disease)   Seizure disorder (HCC)   Goals of care, counseling/discussion   End-stage renal disease on hemodialysis ([place])   Epigastric abdominal pain   Malnutrition of moderate degree   COVID-19 pneumonia Patient found to have bilateral peripheral opacities on chest x-ray without associated hypoxia. Patient received Remdesivir for treatment of COVID-19 infection/pneumonia. No steroids were initiated secondary to no hypoxia. Patient also was treated for Cefepime IV for 5 days.  Acute on chronic anemia Chronic component in setting of kidney disease. Unsure of etiology of acute component. Baseline appears to be around 10-11 with a drop as low as 6.5. patient received 2 units of PRBC on 1/21 with hemoglobin back up to 10.7 and has remained stable.  Acute respiratory distress On 2/2, patient developed respiratory distress with respiratory rate to the mid-high 30s requiring placement of oxygen via [place]. Chest x-ray significant for pleural effusions in addition to pulmonary edema. Symptoms resolved. She did not receive lasix. -Fluid management per nephrology and HD  Constipation/Fecal impaction Resolved with bowel regimen.  Paroxysmal atrial fibrillation -Continue amiodarone. Not on anticoagulation secondary to [place] history.  ESRD on HD Nephrology consulted for HD  Right  LE DVT Chronic. Not started on anticoagulation secondary to history of intracranial hemorrhage. IR consulted for consideration of IVC filter which was not recommended at the time.  Left wrist fracture Orthopedic surgery consulted. Patient underwent ORIF on 1/27. Splint in place with recommendations to follow-up for splint/suture removal 2 weeks from 1/27.  Moderate malnutrition Dietitian consulted with recommendations for MVI and Nepro shake  Acute metabolic encephalopathy Likely multifactorial. Patient with evidence of subdural collection from CT head. Neurosurgery consulted with recommendation for conservative management.  Bilateral subdural fluid collection Associated mass effect. Neurosurgery consulted as mentioned above with recommendation for non-operative management  Left parietal lobe hypodensity Concerning for possible glioma. Unable to perform MRI secondary to MRI incompatible aneurysm clips. Neurosurgery recommends no treatment/follow-up.   DVT prophylaxis: SCDs Code Status:   Code Status: Full Code Family Communication: None at bedside. Called daughter with no response. Disposition Plan: Discharge to SNF when bed is available.   Consultants:   PCCM  Nephrology  Orthopedic surgery  Gastroenterology  Procedures:   ORIF (1/27)  Antimicrobials:  Vancomycin  Remdesivir  Levaquin  Cefepime  Cefazolin    Subjective: No issues per patient. Poor historian.  Objective: Vitals:   11/22/20 2329 11/23/20 0400 11/23/20 0726 11/23/20 0731  BP: (!) 121/94 122/82 111/61 105/89  Pulse:  88 (!) 124   Resp: 18 20 (!) 35   Temp:  97.6 F (36.4 C) (!) 97.4 F (36.3 C) (!) 97.5 F (36.4 C)  TempSrc:  Oral Oral Oral  SpO2: 96% 90% 91%   Weight:  46.2 kg      Intake/Output Summary (Last 24 hours) at 11/23/2020 1055 Last data filed at 11/22/2020 2025 Gross per 24 hour  Intake 340 ml  Output -  Net 340 ml   Filed Weights   11/21/20 2306 11/22/20 0144  11/23/20 0400  Weight: 49.4 kg 47.5 kg 46.2 kg    Examination:  General exam: Appears calm and comfortable Respiratory system: Clear to auscultation. Respiratory effort normal. Cardiovascular system: S1 & S2 heard, RRR. No murmurs, rubs, gallops or clicks. Gastrointestinal system: Abdomen is nondistended, soft and nontender. No organomegaly or masses felt. Normal bowel sounds heard. Central nervous system: Alert and oriented to self. No focal neurological deficits. Musculoskeletal: No edema. No calf tenderness Skin: No cyanosis. No rashes Psychiatry: Judgement and insight appear impaired.   Data Reviewed: I have personally reviewed following labs and imaging studies  CBC Lab Results  Component Value Date   WBC 9.8 11/21/2020   RBC 3.94 11/21/2020   HGB 11.0 (L) 11/21/2020   HCT 29.9 (L) 11/21/2020   MCV 75.9 (L) 11/21/2020   MCH 27.9 11/21/2020   PLT 163 11/21/2020   MCHC 36.8 (H) 11/21/2020   RDW 21.0 (H) 11/21/2020   LYMPHSABS 1.1 11/16/2020   MONOABS 0.6 11/16/2020   EOSABS 0.0 11/16/2020   BASOSABS 0.0 [id number]     Last metabolic panel Lab Results  Component Value Date   NA 139 11/21/2020   K 5.1 11/21/2020   CL 100 11/21/2020   CO2 24 11/21/2020   BUN 39 (H) 11/21/2020   CREATININE 6.30 (H) 11/21/2020   GLUCOSE 111 (H) 11/21/2020   GFRNONAA 7 (L) 11/21/2020   GFRAA 5 (L) 02/03/2019   CALCIUM 9.5 11/21/2020   PHOS 6.8 (H) 10/14/2020   PROT 5.5 (L) 11/16/2020   ALBUMIN 2.3 (L) 11/16/2020   BILITOT 0.7 11/16/2020   ALKPHOS 96 11/16/2020   AST 16 11/16/2020   ALT 11 11/16/2020   ANIONGAP 15 11/21/2020    CBG (last 3)  Recent Labs    11/22/20 1304  GLUCAP 119*     GFR: Estimated Creatinine Clearance: 7.1 mL/min (A) (by C-G formula based on SCr of 6.3 mg/dL (H)).  Coagulation Profile: No results for input(s): INR, PROTIME in the last 168 hours.  No results found for this or any previous visit (from the past 240 hour(s)).      Radiology  Studies: DG Chest Port 1 View  Result Date: 11/22/2020 CLINICAL DATA:  Acute respiratory distress.  Rapid response. EXAM: PORTABLE CHEST 1 VIEW COMPARISON:  Radiograph 11/10/2020 FINDINGS: Dual lead right-sided dialysis catheter remains in place. Implanted limited loop recorder projects over the left chest wall. Presumed shunt catheter tubing projects over the right hemithorax. Small bore catheter projects over the supraclavicular soft tissues on the left. Progressive bilateral pleural effusions from prior exam, right greater than left. Development of pulmonary edema. Cardiomegaly is grossly similar. Dense aortic atherosclerosis. No pneumothorax. IMPRESSION: 1. Bilateral pleural effusions, moderately large on the right and small to moderate on the left. 2. Progressive pulmonary edema. 3. Chronic cardiomegaly.  Aortic Atherosclerosis (ICD10-I70.0). Electronically Signed   By: Keith Rake M.D.   On: 11/22/2020 00:01        Scheduled Meds: . amiodarone  400 mg Oral Daily   Followed by  . [START ON 11/27/2020] amiodarone  200 mg Oral Daily  . calcitRIOL  1 mcg Oral Q T,Th,Sa-HD  . Chlorhexidine Gluconate Cloth  6 each Topical Q0600  . [START ON 11/24/2020] darbepoetin (ARANESP) injection - NON-DIALYSIS  100 mcg Subcutaneous Q Sat-1800  . feeding supplement (NEPRO CARB STEADY)  237 mL Oral TID BM  .  levETIRAcetam  500 mg Oral BID  . methimazole  10 mg Oral BID  . midodrine  10 mg Oral BID WC  . multivitamin  1 tablet Oral QHS  . pantoprazole  40 mg Oral BID  . polyethylene glycol  17 g Oral BID  . rosuvastatin  10 mg Oral Daily   Continuous Infusions:   LOS: 14 days     Cordelia Poche, MD [facility] 11/23/2020, 10:55 AM  If 7PM-7AM, please contact night-coverage [email or web address]

## 2020-11-23 NOTE — TOC Progression Note (Signed)
Transition of Care [facility]) - Progression Note    Patient Details  Name: [name] MRN: [id number] Date of Birth: Oct 17, 1961  Transition of Care [facility]) CM/SW [place], [place] Phone Number: 11/23/2020, 4:31 PM  Clinical Narrative:     CSW was notified by an RN that pt family may be considering SNF again. CSW called daughter [name]. [name] still wants pt to return home. Pt was at [facility] starting around 2015 for 4 years and daughter does not want her to go to a SNF. Daughter states that to her knowledge pt never fell at her home unless it happened at night. Daughter states that she plans to move pt into her living room so there are no stairs and it is easier for pt to get to rest room. Daughter would like a 3 in 1 and possibly  A rolling walker if recommended by PT. Daughter states pt is active with [facility] and has a PCP who comes to the home. Daughter transports pt to her dialysis. CSW explained possible d/c over weekend. Daughter states her cell phone service is poor at her home so sometimes calls don't go through to her but she is able to respond to text message.   Expected Discharge Plan: [place] Barriers to Discharge: Continued Medical Work up  Expected Discharge Plan and Services Expected Discharge Plan: [place]                                               Social Determinants of Health (SDOH) Interventions    Readmission Risk Interventions No flowsheet data found.

## 2020-11-23 NOTE — Care Management Important Message (Signed)
Important Message  Patient Details  Name: [name] MRN: [id number] Date of Birth: 04-Aug-1961   Medicare Important Message Given:  Yes     Shelda Altes 11/23/2020, 10:17 AM

## 2020-11-23 NOTE — Progress Notes (Signed)
Patient ID: [name], female   DOB: 07-Mar-1961, 60 y.o.   MRN: [id number] S: No complaints, not eating much O:BP 105/89 (BP Location: Right Leg)   Pulse (!) 124   Temp (!) 97.5 F (36.4 C) (Oral)   Resp (!) 35   Wt 46.2 kg   SpO2 91%   BMI 19.24 kg/m   Intake/Output Summary (Last 24 hours) at 11/23/2020 0846 Last data filed at 11/22/2020 2025 Gross per 24 hour  Intake 460 ml  Output -  Net 460 ml   Intake/Output: I/O last 3 completed shifts: In: 640 [P.O.:640] Out: 1500 [Other:1500]  Intake/Output this shift:  No intake/output data recorded. Weight change: -3.2 kg Gen: ill-appearing, slowed mentation CVS: tachy Resp: cta Abd: +BS, soft, NT/ND Ext: no edema, RUE AVF +T/B  Recent Labs  Lab 11/16/20 1902 11/21/20 0516  NA 131* 139  K 5.2* 5.1  CL 94* 100  CO2 17* 24  GLUCOSE 118* 111*  BUN 67* 39*  CREATININE 8.38* 6.30*  ALBUMIN 2.3*  --   CALCIUM 9.6 9.5  AST 16  --   ALT 11  --    Liver Function Tests: Recent Labs  Lab 11/16/20 1902  AST 16  ALT 11  ALKPHOS 96  BILITOT 0.7  PROT 5.5*  ALBUMIN 2.3*   No results for input(s): LIPASE, AMYLASE in the last 168 hours. No results for input(s): AMMONIA in the last 168 hours. CBC: Recent Labs  Lab 11/16/20 1902 11/21/20 0516  WBC 11.3* 9.8  NEUTROABS 9.5*  --   HGB 12.0 11.0*  HCT 33.2* 29.9*  MCV 78.5* 75.9*  PLT 292 163   Cardiac Enzymes: No results for input(s): CKTOTAL, CKMB, CKMBINDEX, TROPONINI in the last 168 hours. CBG: Recent Labs  Lab 11/16/20 0854 11/16/20 1249 11/22/20 1304  GLUCAP 115* 112* 119*    Iron Studies: No results for input(s): IRON, TIBC, TRANSFERRIN, FERRITIN in the last 72 hours. Studies/Results: DG Chest Port 1 View  Result Date: 11/22/2020 CLINICAL DATA:  Acute respiratory distress.  Rapid response. EXAM: PORTABLE CHEST 1 VIEW COMPARISON:  Radiograph 11/10/2020 FINDINGS: Dual lead right-sided dialysis catheter remains in place. Implanted limited loop recorder  projects over the left chest wall. Presumed shunt catheter tubing projects over the right hemithorax. Small bore catheter projects over the supraclavicular soft tissues on the left. Progressive bilateral pleural effusions from prior exam, right greater than left. Development of pulmonary edema. Cardiomegaly is grossly similar. Dense aortic atherosclerosis. No pneumothorax. IMPRESSION: 1. Bilateral pleural effusions, moderately large on the right and small to moderate on the left. 2. Progressive pulmonary edema. 3. Chronic cardiomegaly.  Aortic Atherosclerosis (ICD10-I70.0). Electronically Signed   By: Keith Rake M.D.   On: 11/22/2020 00:01   . amiodarone  400 mg Oral Daily   Followed by  . [START ON 11/27/2020] amiodarone  200 mg Oral Daily  . calcitRIOL  1 mcg Oral Q T,Th,Sa-HD  . Chlorhexidine Gluconate Cloth  6 each Topical Q0600  . [START ON 11/24/2020] darbepoetin (ARANESP) injection - NON-DIALYSIS  100 mcg Subcutaneous Q Sat-1800  . feeding supplement (NEPRO CARB STEADY)  237 mL Oral TID BM  . levETIRAcetam  500 mg Oral BID  . methimazole  10 mg Oral BID  . midodrine  10 mg Oral BID WC  . multivitamin  1 tablet Oral QHS  . pantoprazole  40 mg Oral BID  . polyethylene glycol  17 g Oral BID  . rosuvastatin  10 mg Oral Daily  BMET    Component Value Date/Time   NA 139 11/21/2020 0516   K 5.1 11/21/2020 0516   CL 100 11/21/2020 0516   CO2 24 11/21/2020 0516   GLUCOSE 111 (H) 11/21/2020 0516   BUN 39 (H) 11/21/2020 0516   CREATININE 6.30 (H) 11/21/2020 0516   CALCIUM 9.5 11/21/2020 0516   GFRNONAA 7 (L) 11/21/2020 0516   GFRAA 5 (L) 02/03/2019 0652   CBC    Component Value Date/Time   WBC 9.8 11/21/2020 0516   RBC 3.94 11/21/2020 0516   HGB 11.0 (L) 11/21/2020 0516   HCT 29.9 (L) 11/21/2020 0516   PLT 163 11/21/2020 0516   MCV 75.9 (L) 11/21/2020 0516   MCH 27.9 11/21/2020 0516   MCHC 36.8 (H) 11/21/2020 0516   RDW 21.0 (H) 11/21/2020 0516   LYMPHSABS 1.1 11/16/2020  1902   MONOABS 0.6 11/16/2020 1902   EOSABS 0.0 11/16/2020 1902   BASOSABS 0.0 11/16/2020 1902      OP HD: TTS [facility] 3h 38kg 2/2 bath TDC Hep none - calc 120 ug tiw - mircera 75 q2 last 08/23/20 - velphoro 2 ac tid  - last HD 3hrs on 1/18, mostly compliant. - missed VVS post OP f/u - sp plication on [id number]   Assessment/Plan:  1. COVID -19 viral pna - hazy opacities on CXR. SP remdesivir. On IV cefepime.Is nowout of isolation on 1/31 (10days). On room air.  2. Acute on chronic anemia - Hb 9.7 >> 11.5 after 2u prbc's on 1/21. gavearanesp 100 mcg on 1/22. GI consulted, conservative measures recommended. Not a/c candidate. 3. 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 Parox afib w/ RVR -no a/c, as above, IV amio now is po amio. Cards following 5. 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 Per primary team. 6. ESRD - HD TTS.Had HD here Sun/ Tues / Friday this week. Off schedule due to high census and did not get HD yesterday.  Plan for HD today and possibly Sunday then Tuesday next week. 7. AMS - appreciate efforts of primary team.  8. L wrist fracture - sp ORIF 1/27 by ortho 9. Volume - no vol excess on exam. CXR no edema, LLL infiltrate. Wt'sprobnot correct.  10. Metabolic bone disease - binders when taking PO. Continue calcitriol 11. GOC - very frail, multiple complicating medical problems. Per primary team consulting pall care team.   Donetta Potts, MD [facility] [phone]

## 2020-11-24 DIAGNOSIS — R1013 Epigastric pain: Secondary | ICD-10-CM | POA: Diagnosis not present

## 2020-11-24 DIAGNOSIS — I4891 Unspecified atrial fibrillation: Secondary | ICD-10-CM | POA: Diagnosis not present

## 2020-11-24 DIAGNOSIS — E440 Moderate protein-calorie malnutrition: Secondary | ICD-10-CM | POA: Diagnosis not present

## 2020-11-24 LAB — RENAL FUNCTION PANEL
Albumin: 2.1 g/dL — ABNORMAL LOW (ref 3.5–5.0)
Albumin: 2.2 g/dL — ABNORMAL LOW (ref 3.5–5.0)
Anion gap: 17 — ABNORMAL HIGH (ref 5–15)
Anion gap: 18 — ABNORMAL HIGH (ref 5–15)
BUN: 27 mg/dL — ABNORMAL HIGH (ref 6–20)
BUN: 25 mg/dL — ABNORMAL HIGH (ref 6–20)
CO2: 24 mmol/L (ref 22–32)
CO2: 24 mmol/L (ref 22–32)
Calcium: 9.2 mg/dL (ref 8.9–10.3)
Calcium: 9.1 mg/dL (ref 8.9–10.3)
Chloride: 97 mmol/L — ABNORMAL LOW (ref 98–111)
Chloride: 97 mmol/L — ABNORMAL LOW (ref 98–111)
Creatinine, Ser: 4.79 mg/dL — ABNORMAL HIGH (ref 0.44–1.00)
Creatinine, Ser: 4.85 mg/dL — ABNORMAL HIGH (ref 0.44–1.00)
GFR, Estimated: 10 mL/min — ABNORMAL LOW (ref >60)
GFR, Estimated: 10 mL/min — ABNORMAL LOW (ref >60)
Glucose, Bld: 102 mg/dL — ABNORMAL HIGH (ref 70–99)
Glucose, Bld: 93 mg/dL (ref 70–99)
Phosphorus: 5.3 mg/dL — ABNORMAL HIGH (ref 2.5–4.6)
Phosphorus: 5.3 mg/dL — ABNORMAL HIGH (ref 2.5–4.6)
Potassium: 6 mmol/L — ABNORMAL HIGH (ref 3.5–5.1)
Potassium: 5 mmol/L (ref 3.5–5.1)
Sodium: 138 mmol/L (ref 135–145)
Sodium: 139 mmol/L (ref 135–145)

## 2020-11-24 LAB — CBC
HCT: 33.3 % — ABNORMAL LOW (ref 36.0–46.0)
Hemoglobin: 10.9 g/dL — ABNORMAL LOW (ref 12.0–15.0)
MCH: 27.1 pg (ref 26.0–34.0)
MCHC: 32.7 g/dL (ref 30.0–36.0)
MCV: 82.8 fL (ref 80.0–100.0)
Platelets: 188 10*3/uL (ref 150–400)
RBC: 4.02 MIL/uL (ref 3.87–5.11)
RDW: 23.3 % — ABNORMAL HIGH (ref 11.5–15.5)
WBC: 10.1 10*3/uL (ref 4.0–10.5)
nRBC: 0.5 % — ABNORMAL HIGH (ref 0.0–0.2)

## 2020-11-24 LAB — TROPONIN I (HIGH SENSITIVITY): Troponin I (High Sensitivity): 293 ng/L (ref <18)

## 2020-11-24 MED ORDER — SODIUM ZIRCONIUM CYCLOSILICATE 10 G PO PACK
10.0000 g | PACK | Freq: Every day | ORAL | Status: DC
  Administered 2020-11-24 – 2020-11-27 (×4): 10 g via ORAL
  Filled 2020-11-24 (×4): qty 1

## 2020-11-24 MED ORDER — METOPROLOL TARTRATE 25 MG PO TABS
25.0000 mg | ORAL_TABLET | Freq: Two times a day (BID) | ORAL | Status: DC
  Administered 2020-11-24 – 2020-11-27 (×7): 25 mg via ORAL
  Filled 2020-11-24 (×8): qty 1

## 2020-11-24 MED ORDER — AMIODARONE HCL IN DEXTROSE 360-4.14 MG/200ML-% IV SOLN
30.0000 mg/h | INTRAVENOUS | Status: DC
  Administered 2020-11-24: 30 mg/h via INTRAVENOUS
  Filled 2020-11-24: qty 200

## 2020-11-24 MED ORDER — AMIODARONE HCL IN DEXTROSE 360-4.14 MG/200ML-% IV SOLN
60.0000 mg/h | INTRAVENOUS | Status: DC
  Administered 2020-11-24: 60 mg/h via INTRAVENOUS
  Filled 2020-11-24: qty 200

## 2020-11-24 MED ORDER — METOPROLOL TARTRATE 5 MG/5ML IV SOLN: 2.5000 mg | INTRAVENOUS | Status: DC | PRN

## 2020-11-24 MED ORDER — AMIODARONE HCL 200 MG PO TABS
200.0000 mg | ORAL_TABLET | Freq: Every day | ORAL | Status: DC
  Administered 2020-11-24 – 2020-11-27 (×4): 200 mg via ORAL
  Filled 2020-11-24 (×4): qty 1

## 2020-11-24 NOTE — Progress Notes (Signed)
Patient yelling out and respirations increasing into the 30s, MD paged for PRN meds.

## 2020-11-24 NOTE — Plan of Care (Signed)
  Problem: Clinical Measurements: Goal: Respiratory complications will improve Outcome: Progressing   Problem: Activity: Goal: Risk for activity intolerance will decrease Outcome: Progressing   Problem: Coping: Goal: Level of anxiety will decrease Outcome: Progressing   

## 2020-11-24 NOTE — Progress Notes (Signed)
PROGRESS NOTE    [name]  [id number] DOB: 1961-06-04 DOA: 11/08/2020 PCP: Clovia Cuff, MD   Brief Narrative: [name] is a 60 y.o. female with a history of ESRD on HD TTS, CAD, HTN, HLD, paroxysmal A. fib not on any anticoagulation secondary to [place], seizure disorder, CVA in 2015. Patient presented secondary to nausea/epigastric pain and found to have COVID-19 pneumonia not requiring oxygen in addition to left wrist fracture, presumed acute blood loss anemia.    Assessment & Plan:   Principal Problem:   Atrial fibrillation with rapid ventricular response (HCC) Active Problems:   CAD (coronary artery disease)   Seizure disorder (HCC)   Goals of care, counseling/discussion   End-stage renal disease on hemodialysis ([place])   Epigastric abdominal pain   Malnutrition of moderate degree   COVID-19 pneumonia Patient found to have bilateral peripheral opacities on chest x-ray without associated hypoxia. Patient received Remdesivir for treatment of COVID-19 infection/pneumonia. No steroids were initiated secondary to no hypoxia. Patient also was treated for Cefepime IV for 5 days.  Acute on chronic anemia Chronic component in setting of kidney disease. Unsure of etiology of acute component. Baseline appears to be around 10-11 with a drop as low as 6.5. patient received 2 units of PRBC on 1/21 with hemoglobin back up to 10.7 and has remained stable.  Acute respiratory distress On 2/2, patient developed respiratory distress with respiratory rate to the mid-high 30s requiring placement of oxygen via [place]. Chest x-ray significant for pleural effusions in addition to pulmonary edema. Symptoms resolved. She did not receive lasix. -Fluid management per nephrology and HD  Constipation/Fecal impaction Resolved with bowel regimen.  Hyperkalemia In setting of ESRD. Management with HD. Nephrology prescribing Lokelma.  Paroxysmal atrial fibrillation RVR overnight on 2/4 and started on  amiodarone drip -Discontinue amiodarone drip -Restart amiodarone and add metoprolol 25 mg BID -Troponin  ESRD on HD Nephrology consulted for HD  Right LE DVT Chronic. Not started on anticoagulation secondary to history of intracranial hemorrhage. IR consulted for consideration of IVC filter which was not recommended at the time.  Left wrist fracture Orthopedic surgery consulted. Patient underwent ORIF on 1/27. Splint in place with recommendations to follow-up for splint/suture removal 2 weeks from 1/27.  Moderate malnutrition Dietitian consulted with recommendations for MVI and Nepro shake  Acute metabolic encephalopathy Likely multifactorial. Patient with evidence of subdural collection from CT head. Neurosurgery consulted with recommendation for conservative management.  Bilateral subdural fluid collection Associated mass effect. Neurosurgery consulted as mentioned above with recommendation for non-operative management  Left parietal lobe hypodensity Concerning for possible glioma. Unable to perform MRI secondary to MRI incompatible aneurysm clips. Neurosurgery recommends no treatment/follow-up.   DVT prophylaxis: SCDs Code Status:   Code Status: Full Code Family Communication: None at bedside. Disposition Plan: Discharge home with home health when medically stable secondary to afib with RVR and electrolyte abnormalities.   Consultants:   PCCM  Nephrology  Orthopedic surgery  Gastroenterology  Procedures:   ORIF (1/27)  Antimicrobials:  Vancomycin  Remdesivir  Levaquin  Cefepime  Cefazolin    Subjective: No concerns today.  Objective: Vitals:   11/24/20 0120 11/24/20 0400 11/24/20 1200 11/24/20 1222  BP:  (!) 160/79 (!) 155/69 (!) 155/69  Pulse:  88  (!) 28  Resp:  20 18   Temp:  98.1 F (36.7 C)  98 F (36.7 C)  TempSrc:  Oral  Oral  SpO2:  99% 99%   Weight: 44.6 kg  Intake/Output Summary (Last 24 hours) at 11/24/2020 1306 Last data  filed at 11/23/2020 2253 Gross per 24 hour  Intake -  Output 1026 ml  Net -1026 ml   Filed Weights   11/22/20 0144 11/23/20 0400 11/24/20 0120  Weight: 47.5 kg 46.2 kg 44.6 kg    Examination:  General exam: Appears calm and comfortable Respiratory system: Clear to auscultation. Respiratory effort normal. Cardiovascular system: S1 & S2 heard, RRR. No murmurs, rubs, gallops or clicks. Gastrointestinal system: Abdomen is nondistended, soft and nontender. No organomegaly or masses felt. Normal bowel sounds heard. Central nervous system: Alert and oriented to person and place. No focal neurological deficits. Musculoskeletal: No edema. No calf tenderness Skin: No cyanosis. No rashes Psychiatry: Judgement and insight appear impaired   Data Reviewed: I have personally reviewed following labs and imaging studies  CBC Lab Results  Component Value Date   WBC 10.1 11/24/2020   RBC 4.02 11/24/2020   HGB 10.9 (L) 11/24/2020   HCT 33.3 (L) 11/24/2020   MCV 82.8 11/24/2020   MCH 27.1 11/24/2020   PLT 188 11/24/2020   MCHC 32.7 11/24/2020   RDW 23.3 (H) 11/24/2020   LYMPHSABS 1.1 11/16/2020   MONOABS 0.6 11/16/2020   EOSABS 0.0 11/16/2020   BASOSABS 0.0 [id number]     Last metabolic panel Lab Results  Component Value Date   NA 139 11/24/2020   K 5.0 11/24/2020   CL 97 (L) 11/24/2020   CO2 24 11/24/2020   BUN 25 (H) 11/24/2020   CREATININE 4.85 (H) 11/24/2020   GLUCOSE 93 11/24/2020   GFRNONAA 10 (L) 11/24/2020   GFRAA 5 (L) 02/03/2019   CALCIUM 9.1 11/24/2020   PHOS 5.3 (H) 11/24/2020   PROT 5.5 (L) 11/16/2020   ALBUMIN 2.2 (L) 11/24/2020   BILITOT 0.7 11/16/2020   ALKPHOS 96 11/16/2020   AST 16 11/16/2020   ALT 11 11/16/2020   ANIONGAP 18 (H) 11/24/2020    CBG (last 3)  Recent Labs    11/22/20 1304  GLUCAP 119*     GFR: Estimated Creatinine Clearance: 8.9 mL/min (A) (by C-G formula based on SCr of 4.85 mg/dL (H)).  Coagulation Profile: No results for  input(s): INR, PROTIME in the last 168 hours.  No results found for this or any previous visit (from the past 240 hour(s)).      Radiology Studies: No results found.      Scheduled Meds: . calcitRIOL  1 mcg Oral Q T,Th,Sa-HD  . Chlorhexidine Gluconate Cloth  6 each Topical Q0600  . darbepoetin (ARANESP) injection - NON-DIALYSIS  100 mcg Subcutaneous Q Sat-1800  . feeding supplement (NEPRO CARB STEADY)  237 mL Oral TID BM  . levETIRAcetam  500 mg Oral BID  . methimazole  10 mg Oral BID  . midodrine  10 mg Oral BID WC  . multivitamin  1 tablet Oral QHS  . pantoprazole  40 mg Oral BID  . polyethylene glycol  17 g Oral BID  . rosuvastatin  10 mg Oral Daily  . sodium zirconium cyclosilicate  10 g Oral Daily   Continuous Infusions: . amiodarone 30 mg/hr (11/24/20 0937)     LOS: 15 days     Cordelia Poche, MD [facility] 11/24/2020, 1:06 PM  If 7PM-7AM, please contact night-coverage [email or web address]

## 2020-11-24 NOTE — Progress Notes (Signed)
Patient ID: [name], female   DOB: May 02, 1961, 60 y.o.   MRN: [id number] S: Finished HD early this morning.  Not eating much of breakfast, no complaints O:BP (!) 160/79 (BP Location: Left Arm)   Pulse 88   Temp 98.1 F (36.7 C) (Oral)   Resp 20   Wt 44.6 kg   SpO2 99%   BMI 18.58 kg/m   Intake/Output Summary (Last 24 hours) at 11/24/2020 1019 Last data filed at 11/23/2020 2253 Gross per 24 hour  Intake -  Output 1026 ml  Net -1026 ml   Intake/Output: I/O last 3 completed shifts: In: 100 [P.O.:100] Out: 1026 [Other:1026]  Intake/Output this shift:  No intake/output data recorded. Weight change: -1.6 kg Gen: cachectic, appears fatigued but in NAD CVS: RRR Resp: cta Abd: benign Ext: no edema, RUE AVF +T/B  Recent Labs  Lab 11/21/20 0516 11/23/20 2013 11/24/20 0600 11/24/20 0747  NA 139 138 138 139  K 5.1 6.3* 6.0* 5.0  CL 100 99 97* 97*  CO2 '24 22 24 24  '$ GLUCOSE 111* 136* 102* 93  BUN 39* 45* 27* 25*  CREATININE 6.30* 7.13* 4.79* 4.85*  ALBUMIN  --   --  2.1* 2.2*  CALCIUM 9.5 9.7 9.2 9.1  PHOS  --   --  5.3* 5.3*   Liver Function Tests: Recent Labs  Lab 11/24/20 0600 11/24/20 0747  ALBUMIN 2.1* 2.2*   No results for input(s): LIPASE, AMYLASE in the last 168 hours. No results for input(s): AMMONIA in the last 168 hours. CBC: Recent Labs  Lab 11/21/20 0516 11/23/20 2013 11/24/20 0747  WBC 9.8 13.2* 10.1  HGB 11.0* 11.2* 10.9*  HCT 29.9* 33.4* 33.3*  MCV 75.9* 82.5 82.8  PLT 163 222 188   Cardiac Enzymes: No results for input(s): CKTOTAL, CKMB, CKMBINDEX, TROPONINI in the last 168 hours. CBG: Recent Labs  Lab 11/22/20 1304  GLUCAP 119*    Iron Studies: No results for input(s): IRON, TIBC, TRANSFERRIN, FERRITIN in the last 72 hours. Studies/Results: No results found. . calcitRIOL  1 mcg Oral Q T,Th,Sa-HD  . Chlorhexidine Gluconate Cloth  6 each Topical Q0600  . darbepoetin (ARANESP) injection - NON-DIALYSIS  100 mcg Subcutaneous Q Sat-1800   . feeding supplement (NEPRO CARB STEADY)  237 mL Oral TID BM  . heparin sodium (porcine)      . levETIRAcetam  500 mg Oral BID  . methimazole  10 mg Oral BID  . midodrine  10 mg Oral BID WC  . multivitamin  1 tablet Oral QHS  . pantoprazole  40 mg Oral BID  . polyethylene glycol  17 g Oral BID  . rosuvastatin  10 mg Oral Daily  . sodium zirconium cyclosilicate  10 g Oral Daily    BMET    Component Value Date/Time   NA 139 11/24/2020 0747   K 5.0 11/24/2020 0747   CL 97 (L) 11/24/2020 0747   CO2 24 11/24/2020 0747   GLUCOSE 93 11/24/2020 0747   BUN 25 (H) 11/24/2020 0747   CREATININE 4.85 (H) 11/24/2020 0747   CALCIUM 9.1 11/24/2020 0747   GFRNONAA 10 (L) 11/24/2020 0747   GFRAA 5 (L) 02/03/2019 0652   CBC    Component Value Date/Time   WBC 10.1 11/24/2020 0747   RBC 4.02 11/24/2020 0747   HGB 10.9 (L) 11/24/2020 0747   HCT 33.3 (L) 11/24/2020 0747   PLT 188 11/24/2020 0747   MCV 82.8 11/24/2020 0747   MCH 27.1 11/24/2020 0747  MCHC 32.7 11/24/2020 0747   RDW 23.3 (H) 11/24/2020 0747   LYMPHSABS 1.1 11/16/2020 1902   MONOABS 0.6 11/16/2020 1902   EOSABS 0.0 11/16/2020 1902   BASOSABS 0.0 11/16/2020 1902   OP HD: TTS [facility] 3h 38kg 2/2 bath TDC Hep none - calc 120 ug tiw - mircera 75 q2 last 08/23/20 - velphoro 2 ac tid  - last HD 3hrs on 1/18, mostly compliant. - missed VVS post OP f/u - sp plication on [id number] of RUE AVF   Assessment/Plan:  1. COVID -19 viral pna - hazy opacities on CXR. SP remdesivir. Is nowout of isolation on 1/31 (10days). On room air.  2. Acute on chronic anemia - Hb 9.7 >> 11.5 after 2u prbc's on 1/21. gavearanesp 100 mcg on 1/22. GI consulted, conservative measures recommended. Not a/c candidate. 3. 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 Parox afib w/ RVR -no a/c, as above, IV amio now is po amio. Cards following 5. AMS - imaging showing subdural collection, also lesion in L parietal lobe  which may be a glioma. 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 Per primary team. 6. ESRD - HD TTS.Had HD here Sun/ Tues / Friday this week. Off schedule due to high census and did not get HD yesterday.  Plan for HD today and possibly Sunday then Tuesday next week. 7. Hyperkalemia- improved with HD but still mildly elevated.  Will start lokelma and follow.  8. AMS - appreciate efforts of primary team.  9. L wrist fracture - sp ORIF 1/27 by ortho 10. Volume - no vol excess on exam. CXR no edema, LLL infiltrate. Wt'sprobnot correct.  11. Metabolic bone disease - binders when taking PO. Continue calcitriol 12. GOC - very frail, multiple complicating medical problems. Per primary team consulting pall care team.   Donetta Potts, MD [facility] [phone]

## 2020-11-24 NOTE — TOC Progression Note (Signed)
Transition of Care [facility]) - Progression Note    Patient Details  Name: [name] MRN: [id number] Date of Birth: 08-30-1961  Transition of Care [facility]) CM/SW Contact  Zenon Mayo, RN Phone Number: 11/24/2020, 4:38 PM  Clinical Narrative:    Patient is active with [facility], NCM spoke with Joycelyn Schmid, they will be able to do HHRN, HHPT, HHAIDE when patient goes home.  If she goes tomorrow, they can see her on Monday per Joycelyn Schmid.  Will need orders.    Expected Discharge Plan: [place] Barriers to Discharge: Continued Medical Work up  Expected Discharge Plan and Services Expected Discharge Plan: [place]                                               Social Determinants of Health (SDOH) Interventions    Readmission Risk Interventions No flowsheet data found.

## 2020-11-25 ENCOUNTER — Inpatient Hospital Stay (HOSPITAL_COMMUNITY): Payer: 59

## 2020-11-25 DIAGNOSIS — R1013 Epigastric pain: Secondary | ICD-10-CM | POA: Diagnosis not present

## 2020-11-25 DIAGNOSIS — I4891 Unspecified atrial fibrillation: Secondary | ICD-10-CM | POA: Diagnosis not present

## 2020-11-25 DIAGNOSIS — E440 Moderate protein-calorie malnutrition: Secondary | ICD-10-CM | POA: Diagnosis not present

## 2020-11-25 LAB — CBC
HCT: 33.3 % — ABNORMAL LOW (ref 36.0–46.0)
Hemoglobin: 11.3 g/dL — ABNORMAL LOW (ref 12.0–15.0)
MCH: 28.5 pg (ref 26.0–34.0)
MCHC: 33.9 g/dL (ref 30.0–36.0)
MCV: 83.9 fL (ref 80.0–100.0)
Platelets: 170 10*3/uL (ref 150–400)
RBC: 3.97 MIL/uL (ref 3.87–5.11)
RDW: 23 % — ABNORMAL HIGH (ref 11.5–15.5)
WBC: 10.4 10*3/uL (ref 4.0–10.5)
nRBC: 0.6 % — ABNORMAL HIGH (ref 0.0–0.2)

## 2020-11-25 LAB — RENAL FUNCTION PANEL
Albumin: 2.3 g/dL — ABNORMAL LOW (ref 3.5–5.0)
Anion gap: 20 — ABNORMAL HIGH (ref 5–15)
BUN: 33 mg/dL — ABNORMAL HIGH (ref 6–20)
CO2: 20 mmol/L — ABNORMAL LOW (ref 22–32)
Calcium: 9.6 mg/dL (ref 8.9–10.3)
Chloride: 97 mmol/L — ABNORMAL LOW (ref 98–111)
Creatinine, Ser: 5.89 mg/dL — ABNORMAL HIGH (ref 0.44–1.00)
GFR, Estimated: 8 mL/min — ABNORMAL LOW (ref >60)
Glucose, Bld: 96 mg/dL (ref 70–99)
Phosphorus: 7 mg/dL — ABNORMAL HIGH (ref 2.5–4.6)
Potassium: 5.9 mmol/L — ABNORMAL HIGH (ref 3.5–5.1)
Sodium: 137 mmol/L (ref 135–145)

## 2020-11-25 MED ORDER — DARBEPOETIN ALFA 100 MCG/0.5ML IJ SOSY
PREFILLED_SYRINGE | INTRAMUSCULAR | Status: AC
  Filled 2020-11-25: qty 0.5

## 2020-11-25 MED ORDER — LORAZEPAM 2 MG/ML IJ SOLN: 0.5000 mg | Freq: Once | INTRAMUSCULAR | Status: DC | PRN

## 2020-11-25 NOTE — TOC Progression Note (Signed)
Transition of Care [facility]) - Progression Note    Patient Details  Name: Izaria Zhou MRN: [id number] Date of Birth: 1961-02-25  Transition of Care [facility]) CM/SW Contact  Maris Abascal, Nonda Lou, [place] Phone Number: 11/25/2020, 9:10 AM  Clinical Narrative:   [facility] team for discharge planning. Pt remains inpatient for treatment of COVID-pneumonia. Current discharge plan is home with home health services- [facility]. Will continue to monitor for discharge planning.    Expected Discharge Plan: [place] Barriers to Discharge: Continued Medical Work up  Expected Discharge Plan and Services Expected Discharge Plan: [place]                                               Social Determinants of Health (SDOH) Interventions    Readmission Risk Interventions No flowsheet data found.

## 2020-11-25 NOTE — Progress Notes (Signed)
Patient returned to unit from Hemodialysis. No complaints at this time.

## 2020-11-25 NOTE — Progress Notes (Signed)
Patient ID: [name], female   DOB: 27-Dec-1960, 60 y.o.   MRN: [id number] S: No complaints O:BP (!) 193/66   Pulse 89   Temp 97.8 F (36.6 C) (Oral)   Resp (!) 28   Wt 46 kg   SpO2 99%   BMI 19.16 kg/m   Intake/Output Summary (Last 24 hours) at 11/25/2020 1115 Last data filed at 11/25/2020 0720 Gross per 24 hour  Intake 598 ml  Output 0 ml  Net 598 ml   Intake/Output: I/O last 3 completed shifts: In: 480 [P.O.:480] Out: 1026 [Other:1026]  Intake/Output this shift:  Total I/O In: 118 [P.O.:118] Out: -  Weight change: 1.4 kg Gen: cachectic, tachypneic at 28, lethargic CVS: RRR Resp: cta Abd: +BS, soft, NT/ND Ext: no edema RUE AVF +T/B, bandage in place  Recent Labs  Lab 11/21/20 0516 11/23/20 2013 11/24/20 0600 11/24/20 0747 11/25/20 0433  NA 139 138 138 139 137  K 5.1 6.3* 6.0* 5.0 5.9*  CL 100 99 97* 97* 97*  CO2 '24 22 24 24 '$ 20*  GLUCOSE 111* 136* 102* 93 96  BUN 39* 45* 27* 25* 33*  CREATININE 6.30* 7.13* 4.79* 4.85* 5.89*  ALBUMIN  --   --  2.1* 2.2* 2.3*  CALCIUM 9.5 9.7 9.2 9.1 9.6  PHOS  --   --  5.3* 5.3* 7.0*   Liver Function Tests: Recent Labs  Lab 11/24/20 0600 11/24/20 0747 11/25/20 0433  ALBUMIN 2.1* 2.2* 2.3*   No results for input(s): LIPASE, AMYLASE in the last 168 hours. No results for input(s): AMMONIA in the last 168 hours. CBC: Recent Labs  Lab 11/21/20 0516 11/23/20 2013 11/24/20 0747 11/25/20 0433  WBC 9.8 13.2* 10.1 10.4  HGB 11.0* 11.2* 10.9* 11.3*  HCT 29.9* 33.4* 33.3* 33.3*  MCV 75.9* 82.5 82.8 83.9  PLT 163 222 188 170   Cardiac Enzymes: No results for input(s): CKTOTAL, CKMB, CKMBINDEX, TROPONINI in the last 168 hours. CBG: Recent Labs  Lab 11/22/20 1304  GLUCAP 119*    Iron Studies: No results for input(s): IRON, TIBC, TRANSFERRIN, FERRITIN in the last 72 hours. Studies/Results: DG CHEST PORT 1 VIEW  Result Date: 11/25/2020 CLINICAL DATA:  60 year old female with hypoxia. EXAM: PORTABLE CHEST 1 VIEW  COMPARISON:  Portable chest 11/21/2020 and earlier. FINDINGS: Portable AP semi upright view at 0736 hours. Stable right chest dual lumen dialysis type catheter. Left chest cardiac loop recorder or external defibrillator and right neck and chest shunt catheter are stable. Calcified aortic atherosclerosis. Decreased but not resolved veiling opacity in the left lower lung. Continued dense lower lobe opacity. Moderate Veiling opacity not significantly changed in the right lung. No pneumothorax. Upper lung pulmonary vascularity appears normalized. No air bronchograms. Stable cardiac size and mediastinal contours. Visualized tracheal air column is within normal limits. No acute osseous abnormality identified. IMPRESSION: 1. Regressed pulmonary edema since 11/21/2020. Moderate right and small left pleural effusions persist with lower lobe collapse or consolidation. 2. No new cardiopulmonary abnormality. Electronically Signed   By: Genevie Ann M.D.   On: 11/25/2020 09:15   . amiodarone  200 mg Oral Daily  . calcitRIOL  1 mcg Oral Q T,Th,Sa-HD  . Chlorhexidine Gluconate Cloth  6 each Topical Q0600  . darbepoetin (ARANESP) injection - NON-DIALYSIS  100 mcg Subcutaneous Q Sat-1800  . feeding supplement (NEPRO CARB STEADY)  237 mL Oral TID BM  . levETIRAcetam  500 mg Oral BID  . methimazole  10 mg Oral BID  . metoprolol tartrate  25 mg Oral BID  . midodrine  10 mg Oral BID WC  . multivitamin  1 tablet Oral QHS  . pantoprazole  40 mg Oral BID  . polyethylene glycol  17 g Oral BID  . rosuvastatin  10 mg Oral Daily  . sodium zirconium cyclosilicate  10 g Oral Daily    BMET    Component Value Date/Time   NA 137 11/25/2020 0433   K 5.9 (H) 11/25/2020 0433   CL 97 (L) 11/25/2020 0433   CO2 20 (L) 11/25/2020 0433   GLUCOSE 96 11/25/2020 0433   BUN 33 (H) 11/25/2020 0433   CREATININE 5.89 (H) 11/25/2020 0433   CALCIUM 9.6 11/25/2020 0433   GFRNONAA 8 (L) 11/25/2020 0433   GFRAA 5 (L) 02/03/2019 0652   CBC     Component Value Date/Time   WBC 10.4 11/25/2020 0433   RBC 3.97 11/25/2020 0433   HGB 11.3 (L) 11/25/2020 0433   HCT 33.3 (L) 11/25/2020 0433   PLT 170 11/25/2020 0433   MCV 83.9 11/25/2020 0433   MCH 28.5 11/25/2020 0433   MCHC 33.9 11/25/2020 0433   RDW 23.0 (H) 11/25/2020 0433   LYMPHSABS 1.1 11/16/2020 1902   MONOABS 0.6 11/16/2020 1902   EOSABS 0.0 11/16/2020 1902   BASOSABS 0.0 11/16/2020 1902    OP HD: TTS [facility] 3h 38kg 2/2 bath TDC Hep none - calc 120 ug tiw - mircera 75 q2 last 08/23/20 - velphoro 2 ac tid  - last HD 3hrs on 1/18, mostly compliant. - missed VVS post OP f/u - sp plication on [id number] of RUE AVF   Assessment/Plan:  1. COVID -19 viral pna - hazy opacities on CXR. SP remdesivir. Is nowout of isolation on 1/31 (10days). On room air.  2. Acute on chronic anemia - Hb 9.7 >> 11.5 after 2u prbc's on 1/21. gavearanesp 100 mcg on 1/22. GI consulted, conservative measures recommended. Not a/c candidate. 3. 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 Parox afib w/ RVR -no a/c, as above, IV amio now is po amio. Cards following 5. 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 Per primary team. 6. ESRD - HD TTS.Had HD here Sun/ Tues / Friday this week. Off schedule due to high census and did not get HD yesterday. Plan for HD today and get back on schedule Tuesday. 7. Hyperkalemia- improved with HD but still mildly elevated.  Will start lokelma and follow.  8. AMS - appreciate efforts of primary team.  9. L wrist fracture - sp ORIF 1/27 by ortho 10. Volume - no vol excess on exam. CXR no edema, LLL infiltrate. Wt'sprobnot correct.  11. Metabolic bone disease - binders when taking PO. Continue calcitriol 12. GOC - very frail, multiple complicating medical problems. Per primary team consulting pall care team.   Donetta Potts,  MD [facility] [phone]

## 2020-11-25 NOTE — Progress Notes (Signed)
PROGRESS NOTE    [name]  [id number] DOB: 08/20/1961 DOA: 11/08/2020 PCP: Clovia Cuff, MD   Brief Narrative: [name] is a 60 y.o. female with a history of ESRD on HD TTS, CAD, HTN, HLD, paroxysmal A. fib not on any anticoagulation secondary to [place], seizure disorder, CVA in 2015. Patient presented secondary to nausea/epigastric pain and found to have COVID-19 pneumonia not requiring oxygen in addition to left wrist fracture, presumed acute blood loss anemia.    Assessment & Plan:   Principal Problem:   Atrial fibrillation with rapid ventricular response (HCC) Active Problems:   CAD (coronary artery disease)   Seizure disorder (HCC)   Goals of care, counseling/discussion   End-stage renal disease on hemodialysis ([place])   Epigastric abdominal pain   Malnutrition of moderate degree   COVID-19 pneumonia Patient found to have bilateral peripheral opacities on chest x-ray without associated hypoxia. Patient received Remdesivir for treatment of COVID-19 infection/pneumonia. No steroids were initiated secondary to no hypoxia. Patient also was treated for Cefepime IV for 5 days.  Acute on chronic anemia Chronic component in setting of kidney disease. Unsure of etiology of acute component. Baseline appears to be around 10-11 with a drop as low as 6.5. patient received 2 units of PRBC on 1/21 with hemoglobin back up to 10.7 and has remained stable.  Acute respiratory distress On 2/2, patient developed respiratory distress with respiratory rate to the mid-high 30s requiring placement of oxygen via [place]. Chest x-ray significant for pleural effusions in addition to pulmonary edema. Symptoms resolved. She did not receive lasix. -Fluid management per nephrology and HD -Repeat Chest x-ray today  Constipation/Fecal impaction Resolved with bowel regimen.  Hyperkalemia In setting of ESRD. Management with HD. Nephrology prescribing Lokelma.  Paroxysmal atrial fibrillation RVR  overnight on 2/4 and started on amiodarone drip -Continue amiodarone and metoprolol 25 mg BID  ESRD on HD Nephrology consulted for HD  Right LE DVT Chronic. Not started on anticoagulation secondary to history of intracranial hemorrhage. IR consulted for consideration of IVC filter which was not recommended at the time.  Left wrist fracture Orthopedic surgery consulted. Patient underwent ORIF on 1/27. Splint in place with recommendations to follow-up for splint/suture removal 2 weeks from 1/27.  Moderate malnutrition Dietitian consulted with recommendations for MVI and Nepro shake  Acute metabolic encephalopathy Likely multifactorial. Patient with evidence of subdural collection from CT head. Neurosurgery consulted with recommendation for conservative management.  Bilateral subdural fluid collection Associated mass effect. Neurosurgery consulted as mentioned above with recommendation for non-operative management  Left parietal lobe hypodensity Concerning for possible glioma. Unable to perform MRI secondary to MRI incompatible aneurysm clips. Neurosurgery recommends no treatment/follow-up.   DVT prophylaxis: SCDs Code Status:   Code Status: Full Code Family Communication: None at bedside. Disposition Plan: Discharge home with home health when medically stable secondary to afib with RVR and electrolyte abnormalities.   Consultants:   PCCM  Nephrology  Orthopedic surgery  Gastroenterology  Procedures:   ORIF (1/27)  Antimicrobials:  Vancomycin  Remdesivir  Levaquin  Cefepime  Cefazolin    Subjective: No concerns today. States she has no trouble breathing.   Objective: Vitals:   11/25/20 0010 11/25/20 0022 11/25/20 0340 11/25/20 0400  BP: (!) 170/78   (!) 152/73  Pulse: 80   73  Resp: (!) 30   (!) 28  Temp: 98.7 F (37.1 C)   97.8 F (36.6 C)  TempSrc: Oral   Oral  SpO2: 94%  (!) 87% 99%  Weight:  46 kg      Intake/Output Summary (Last 24 hours)  at 11/25/2020 0806 Last data filed at 11/25/2020 0720 Gross per 24 hour  Intake 598 ml  Output 0 ml  Net 598 ml   Filed Weights   11/23/20 0400 11/24/20 0120 11/25/20 0022  Weight: 46.2 kg 44.6 kg 46 kg    Examination:  General exam: Appears calm and comfortable Respiratory system: Clear to auscultation. Tachypnea Cardiovascular system: S1 & S2 heard, RRR. No murmurs, rubs, gallops or clicks. Gastrointestinal system: Abdomen is nondistended, soft and nontender. No organomegaly or masses felt. Normal bowel sounds heard. Central nervous system: Alert and oriented to person and place. Musculoskeletal: No edema. No calf tenderness Skin: No cyanosis. No rashes   Data Reviewed: I have personally reviewed following labs and imaging studies  CBC Lab Results  Component Value Date   WBC 10.4 11/25/2020   RBC 3.97 11/25/2020   HGB 11.3 (L) 11/25/2020   HCT 33.3 (L) 11/25/2020   MCV 83.9 11/25/2020   MCH 28.5 11/25/2020   PLT 170 11/25/2020   MCHC 33.9 11/25/2020   RDW 23.0 (H) 11/25/2020   LYMPHSABS 1.1 11/16/2020   MONOABS 0.6 11/16/2020   EOSABS 0.0 11/16/2020   BASOSABS 0.0 [id number]     Last metabolic panel Lab Results  Component Value Date   NA 137 11/25/2020   K 5.9 (H) 11/25/2020   CL 97 (L) 11/25/2020   CO2 20 (L) 11/25/2020   BUN 33 (H) 11/25/2020   CREATININE 5.89 (H) 11/25/2020   GLUCOSE 96 11/25/2020   GFRNONAA 8 (L) 11/25/2020   GFRAA 5 (L) 02/03/2019   CALCIUM 9.6 11/25/2020   PHOS 7.0 (H) 11/25/2020   PROT 5.5 (L) 11/16/2020   ALBUMIN 2.3 (L) 11/25/2020   BILITOT 0.7 11/16/2020   ALKPHOS 96 11/16/2020   AST 16 11/16/2020   ALT 11 11/16/2020   ANIONGAP 20 (H) 11/25/2020    CBG (last 3)  Recent Labs    11/22/20 1304  GLUCAP 119*     GFR: Estimated Creatinine Clearance: 7.6 mL/min (A) (by C-G formula based on SCr of 5.89 mg/dL (H)).  Coagulation Profile: No results for input(s): INR, PROTIME in the last 168 hours.  No results found for  this or any previous visit (from the past 240 hour(s)).      Radiology Studies: No results found.      Scheduled Meds: . amiodarone  200 mg Oral Daily  . calcitRIOL  1 mcg Oral Q T,Th,Sa-HD  . Chlorhexidine Gluconate Cloth  6 each Topical Q0600  . darbepoetin (ARANESP) injection - NON-DIALYSIS  100 mcg Subcutaneous Q Sat-1800  . feeding supplement (NEPRO CARB STEADY)  237 mL Oral TID BM  . levETIRAcetam  500 mg Oral BID  . methimazole  10 mg Oral BID  . metoprolol tartrate  25 mg Oral BID  . midodrine  10 mg Oral BID WC  . multivitamin  1 tablet Oral QHS  . pantoprazole  40 mg Oral BID  . polyethylene glycol  17 g Oral BID  . rosuvastatin  10 mg Oral Daily  . sodium zirconium cyclosilicate  10 g Oral Daily   Continuous Infusions:    LOS: 16 days     Cordelia Poche, MD [facility] 11/25/2020, 8:06 AM  If 7PM-7AM, please contact night-coverage [email or web address]

## 2020-11-25 NOTE — Progress Notes (Signed)
Patient left unit around 1130 for hemodialysis.

## 2020-11-25 NOTE — Progress Notes (Addendum)
Per CCMD, patient was in NSR before leaving for hemodialysis, upon returning is now in a-fib. Current heart rate is in the 90's. Patient states no chest pain, dizziness, or rapid heart racing at this time. Lonny Prude, MD paged. Page returned, no new orders at this time.

## 2020-11-26 ENCOUNTER — Inpatient Hospital Stay (HOSPITAL_COMMUNITY): Payer: 59

## 2020-11-26 DIAGNOSIS — E440 Moderate protein-calorie malnutrition: Secondary | ICD-10-CM | POA: Diagnosis not present

## 2020-11-26 DIAGNOSIS — I4891 Unspecified atrial fibrillation: Secondary | ICD-10-CM | POA: Diagnosis not present

## 2020-11-26 DIAGNOSIS — R1013 Epigastric pain: Secondary | ICD-10-CM | POA: Diagnosis not present

## 2020-11-26 HISTORY — PX: IR THORACENTESIS ASP PLEURAL SPACE W/IMG GUIDE: IMG5380

## 2020-11-26 MED ORDER — LIDOCAINE HCL 1 % IJ SOLN
INTRAMUSCULAR | Status: DC | PRN
  Administered 2020-11-26: 10 mL

## 2020-11-26 MED ORDER — LIDOCAINE HCL 1 % IJ SOLN
INTRAMUSCULAR | Status: AC
  Filled 2020-11-26: qty 20

## 2020-11-26 NOTE — Procedures (Addendum)
PROCEDURE SUMMARY:  Successful image-guided right thoracentesis. Yielded 1.0 liters of hazy gold fluid. Procedure was stopped after 1.0 liters per patient request secondary to patient symptom (chest pain). Patient tolerated procedure well. No immediate complications. EBL < 1 mL.  Specimen was not sent for labs. CXR ordered.  Please see imaging section of Epic for full dictation.   Claris Pong Tashya Alberty PA-C 11/26/2020 3:06 PM

## 2020-11-26 NOTE — Plan of Care (Signed)
  Problem: Clinical Measurements: Goal: Ability to maintain clinical measurements within normal limits will improve Outcome: Progressing   Problem: Clinical Measurements: Goal: Respiratory complications will improve Outcome: Progressing   Problem: Activity: Goal: Risk for activity intolerance will decrease Outcome: Progressing   Problem: Nutrition: Goal: Adequate nutrition will be maintained Outcome: Progressing   

## 2020-11-26 NOTE — Progress Notes (Signed)
Patient off the floor for thoracentesis

## 2020-11-26 NOTE — Progress Notes (Signed)
PT Cancellation Note  Patient Details Name: [name] MRN: [id number] DOB: 02/10/1961   Cancelled Treatment:    Reason Eval/Treat Not Completed: Patient declined, no reason specified.  Pt was seen for attempt of therapy and declined with no specific reason given despite asking her for causes to have nursing intervene.  Agreed to get OOB tomorrow.   Ramond Dial 11/26/2020, 4:02 PM   Mee Hives, PT MS Acute Rehab Dept. Number: [place] and [place]

## 2020-11-26 NOTE — Progress Notes (Signed)
PROGRESS NOTE    [name]  [id number] DOB: Feb 25, 1961 DOA: 11/08/2020 PCP: Clovia Cuff, MD   Brief Narrative: [name] is a 60 y.o. female with a history of ESRD on HD TTS, CAD, HTN, HLD, paroxysmal A. fib not on any anticoagulation secondary to [place], seizure disorder, CVA in 2015. Patient presented secondary to nausea/epigastric pain and found to have COVID-19 pneumonia not requiring oxygen in addition to left wrist fracture, presumed acute blood loss anemia.    Assessment & Plan:   Principal Problem:   Atrial fibrillation with rapid ventricular response (HCC) Active Problems:   CAD (coronary artery disease)   Seizure disorder (HCC)   Goals of care, counseling/discussion   End-stage renal disease on hemodialysis ([place])   Epigastric abdominal pain   Malnutrition of moderate degree   COVID-19 pneumonia Patient found to have bilateral peripheral opacities on chest x-ray without associated hypoxia. Patient received Remdesivir for treatment of COVID-19 infection/pneumonia. No steroids were initiated secondary to no hypoxia. Patient also was treated for Cefepime IV for 5 days.  Acute on chronic anemia Chronic component in setting of kidney disease. Unsure of etiology of acute component. Baseline appears to be around 10-11 with a drop as low as 6.5. patient received 2 units of PRBC on 1/21 with hemoglobin back up to 10.7 and has remained stable.  Acute respiratory distress On 2/2, patient developed respiratory distress with respiratory rate to the mid-high 30s requiring placement of oxygen via [place]. Chest x-ray significant for pleural effusions in addition to pulmonary edema. Symptoms resolved. She did not receive lasix. On repeat chest x-ray, she has evidence of pleural effusions -Fluid management per nephrology and HD -Thoracentesis  Constipation/Fecal impaction Resolved with bowel regimen.  Hyperkalemia In setting of ESRD. Management with HD. Nephrology prescribing  Lokelma.  Paroxysmal atrial fibrillation RVR overnight on 2/4 and started on amiodarone drip -Continue amiodarone and metoprolol 25 mg BID  ESRD on HD Nephrology consulted for HD  Right LE DVT Chronic. Not started on anticoagulation secondary to history of intracranial hemorrhage. IR consulted for consideration of IVC filter which was not recommended at the time.  Left wrist fracture Orthopedic surgery consulted. Patient underwent ORIF on 1/27. Splint in place with recommendations to follow-up for splint/suture removal 2 weeks from 1/27.  Moderate malnutrition Dietitian consulted with recommendations for MVI and Nepro shake  Acute metabolic encephalopathy Likely multifactorial. Patient with evidence of subdural collection from CT head. Neurosurgery consulted with recommendation for conservative management.  Bilateral subdural fluid collection Associated mass effect. Neurosurgery consulted as mentioned above with recommendation for non-operative management  Left parietal lobe hypodensity Concerning for possible glioma. Unable to perform MRI secondary to MRI incompatible aneurysm clips. Neurosurgery recommends no treatment/follow-up.   DVT prophylaxis: SCDs Code Status:   Code Status: Full Code Family Communication: None at bedside. Disposition Plan: Discharge home with home health likely in 24 hours after thoracentesis and improvement of tachypnea/pleural effusion   Consultants:   PCCM  Nephrology  Orthopedic surgery  Gastroenterology  Procedures:   ORIF (1/27)  Antimicrobials:  Vancomycin  Remdesivir  Levaquin  Cefepime  Cefazolin    Subjective: No issues noted overnight.  Objective: Vitals:   11/25/20 2200 11/26/20 0137 11/26/20 0430 11/26/20 1122  BP: 135/63  (!) 170/67 (!) 146/82  Pulse:   74 76  Resp:   (!) 30 18  Temp:   97.9 F (36.6 C) 98 F (36.7 C)  TempSrc:   Oral   SpO2:   97% 94%  Weight:  42.9 kg      Intake/Output Summary  (Last 24 hours) at 11/26/2020 1205 Last data filed at 11/25/2020 2241 Gross per 24 hour  Intake 480 ml  Output 2458 ml  Net -1978 ml   Filed Weights   11/25/20 0022 11/25/20 1436 11/26/20 0137  Weight: 46 kg 43.6 kg 42.9 kg    Examination:  General exam: Appears calm and comfortable Respiratory system: Clear to auscultation. Respiratory effort normal. Mild tachypnea Cardiovascular system: S1 & S2 heard, RRR. No murmurs, rubs, gallops or clicks. Gastrointestinal system: Abdomen is nondistended, soft and nontender. No organomegaly or masses felt. Normal bowel sounds heard. Central nervous system: Asleep Musculoskeletal: No edema. No calf tenderness Skin: No cyanosis. No rashes   Data Reviewed: I have personally reviewed following labs and imaging studies  CBC Lab Results  Component Value Date   WBC 10.4 11/25/2020   RBC 3.97 11/25/2020   HGB 11.3 (L) 11/25/2020   HCT 33.3 (L) 11/25/2020   MCV 83.9 11/25/2020   MCH 28.5 11/25/2020   PLT 170 11/25/2020   MCHC 33.9 11/25/2020   RDW 23.0 (H) 11/25/2020   LYMPHSABS 1.1 11/16/2020   MONOABS 0.6 11/16/2020   EOSABS 0.0 11/16/2020   BASOSABS 0.0 [id number]     Last metabolic panel Lab Results  Component Value Date   NA 137 11/25/2020   K 5.9 (H) 11/25/2020   CL 97 (L) 11/25/2020   CO2 20 (L) 11/25/2020   BUN 33 (H) 11/25/2020   CREATININE 5.89 (H) 11/25/2020   GLUCOSE 96 11/25/2020   GFRNONAA 8 (L) 11/25/2020   GFRAA 5 (L) 02/03/2019   CALCIUM 9.6 11/25/2020   PHOS 7.0 (H) 11/25/2020   PROT 5.5 (L) 11/16/2020   ALBUMIN 2.3 (L) 11/25/2020   BILITOT 0.7 11/16/2020   ALKPHOS 96 11/16/2020   AST 16 11/16/2020   ALT 11 11/16/2020   ANIONGAP 20 (H) 11/25/2020    CBG (last 3)  No results for input(s): GLUCAP in the last 72 hours.   GFR: Estimated Creatinine Clearance: 7.1 mL/min (A) (by C-G formula based on SCr of 5.89 mg/dL (H)).  Coagulation Profile: No results for input(s): INR, PROTIME in the last 168  hours.  No results found for this or any previous visit (from the past 240 hour(s)).      Radiology Studies: DG CHEST PORT 1 VIEW  Result Date: 11/26/2020 CLINICAL DATA:  Tachypnea EXAM: PORTABLE CHEST 1 VIEW COMPARISON:  November 25, 2020 FINDINGS: Central catheter tip is at the cavoatrial junction, stable. A shunt extends along the right hemithorax. Loop recorder again noted on the left. No pneumothorax. There is moderate partially loculated pleural effusion on the right with smaller pleural effusion on the left. There is airspace opacity in each lower lung region and right mid lung region, stable. Heart is mildly enlarged with pulmonary venous hypertension, stable. No adenopathy. There is aortic atherosclerosis. No bone lesions. IMPRESSION: Stable catheter positions. No pneumothorax. Pleural effusions bilaterally, larger on the right than the left. Areas of airspace opacity in the right mid lung and bibasilar regions, stable. Stable cardiac silhouette. Aortic Atherosclerosis (ICD10-I70.0). Electronically Signed   By: Lowella Grip III M.D.   On: 11/26/2020 08:03   DG CHEST PORT 1 VIEW  Result Date: 11/25/2020 CLINICAL DATA:  60 year old female with hypoxia. EXAM: PORTABLE CHEST 1 VIEW COMPARISON:  Portable chest 11/21/2020 and earlier. FINDINGS: Portable AP semi upright view at 0736 hours. Stable right chest dual lumen dialysis type catheter. Left chest  cardiac loop recorder or external defibrillator and right neck and chest shunt catheter are stable. Calcified aortic atherosclerosis. Decreased but not resolved veiling opacity in the left lower lung. Continued dense lower lobe opacity. Moderate Veiling opacity not significantly changed in the right lung. No pneumothorax. Upper lung pulmonary vascularity appears normalized. No air bronchograms. Stable cardiac size and mediastinal contours. Visualized tracheal air column is within normal limits. No acute osseous abnormality identified. IMPRESSION: 1.  Regressed pulmonary edema since 11/21/2020. Moderate right and small left pleural effusions persist with lower lobe collapse or consolidation. 2. No new cardiopulmonary abnormality. Electronically Signed   By: Genevie Ann M.D.   On: 11/25/2020 09:15        Scheduled Meds: . amiodarone  200 mg Oral Daily  . calcitRIOL  1 mcg Oral Q T,Th,Sa-HD  . Chlorhexidine Gluconate Cloth  6 each Topical Q0600  . feeding supplement (NEPRO CARB STEADY)  237 mL Oral TID BM  . levETIRAcetam  500 mg Oral BID  . methimazole  10 mg Oral BID  . metoprolol tartrate  25 mg Oral BID  . midodrine  10 mg Oral BID WC  . multivitamin  1 tablet Oral QHS  . pantoprazole  40 mg Oral BID  . polyethylene glycol  17 g Oral BID  . rosuvastatin  10 mg Oral Daily  . sodium zirconium cyclosilicate  10 g Oral Daily   Continuous Infusions:    LOS: 17 days     Cordelia Poche, MD [facility] 11/26/2020, 12:05 PM  If 7PM-7AM, please contact night-coverage [email or web address]

## 2020-11-26 NOTE — Progress Notes (Signed)
Patient ID: [name], female   DOB: 09/09/1961, 60 y.o.   MRN: [id number] S: No complaints, seen in room. Has 1.0 L removed w/ thoracentesis today.   O:BP (!) 117/58 (BP Location: Left Arm)   Pulse 70   Temp 97.9 F (36.6 C) (Oral)   Resp 17   Wt 42.9 kg   SpO2 96%   BMI 17.87 kg/m   Intake/Output Summary (Last 24 hours) at 11/26/2020 1605 Last data filed at 11/25/2020 2241 Gross per 24 hour  Intake 480 ml  Output 0 ml  Net 480 ml   Intake/Output: I/O last 3 completed shifts: In: 1078 [P.O.:1078] Out: 2458 [Other:2458]  Intake/Output this shift:  No intake/output data recorded. Weight change: -2.4 kg Gen: cachectic, alert and pleasant CVS: RRR Resp: cta Abd: +BS, soft, NT/ND Ext: no edema RUE AVF +T/B, bandage in place   OP HD: TTS [facility] 3h 38kg 2/2 bath TDC Hep none - calc 120 ug tiw - mircera 75 q2 last 08/23/20 - velphoro 2 ac tid  - last HD 3hrs on 1/18, mostly compliant. - missed VVS post OP f/u - sp plication on [id number] of RUE AVF   Assessment/Plan:  1. COVID -19 viral pna - hazy opacities on CXR. SP remdesivir. Is nowout of isolation on 1/31 (10days). On room air.  2. Acute on chronic anemia - Hb 9.7 >> 11.5 after 2u prbc's on 1/21. gavearanesp 100 mcg on 1/22. GI consulted, conservative measures recommended. Not a/c candidate. 3. 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 Parox afib w/ RVR -no a/c, as above, IV amio now is po amio. Cards following 5. 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 Per primary team. 6. ESRD - HD TTS. Next HD Tuesday.  7. Hyperkalemia- improved with HD but still mildly elevated.  Will start lokelma and follow.  8. AMS - appreciate efforts of primary team.  9. L wrist fracture - sp ORIF 1/27 by ortho 10. Volume - no vol excess on exam. CXR no edema, LLL infiltrate. Wt'sprobnot correct.   11. Metabolic bone disease - binders when taking PO. Continue calcitriol 12. GOC - very frail, multiple complicating medical problems. Per pmd possible dc home in 24 hrs after thoracentesis.    Kelly Splinter, MD 11/26/2020, 4:06 PM   Recent Labs  Lab 11/21/20 0516 11/23/20 2013 11/24/20 0600 11/24/20 0747 11/25/20 0433  NA 139 138 138 139 137  K 5.1 6.3* 6.0* 5.0 5.9*  CL 100 99 97* 97* 97*  CO2 '24 22 24 24 '$ 20*  GLUCOSE 111* 136* 102* 93 96  BUN 39* 45* 27* 25* 33*  CREATININE 6.30* 7.13* 4.79* 4.85* 5.89*  ALBUMIN  --   --  2.1* 2.2* 2.3*  CALCIUM 9.5 9.7 9.2 9.1 9.6  PHOS  --   --  5.3* 5.3* 7.0*   CBC: Recent Labs  Lab 11/21/20 0516 11/23/20 2013 11/24/20 0747 11/25/20 0433  WBC 9.8 13.2* 10.1 10.4  HGB 11.0* 11.2* 10.9* 11.3*  HCT 29.9* 33.4* 33.3* 33.3*  MCV 75.9* 82.5 82.8 83.9  PLT 163 222 188 170   Cardiac Enzymes: No results for input(s): CKTOTAL, CKMB, CKMBINDEX, TROPONINI in the last 168 hours. CBG: Recent Labs  Lab 11/22/20 1304  GLUCAP 119*   . amiodarone  200 mg Oral Daily  . calcitRIOL  1 mcg Oral Q T,Th,Sa-HD  .  Chlorhexidine Gluconate Cloth  6 each Topical Q0600  . feeding supplement (NEPRO CARB STEADY)  237 mL Oral TID BM  . levETIRAcetam  500 mg Oral BID  . methimazole  10 mg Oral BID  . metoprolol tartrate  25 mg Oral BID  . midodrine  10 mg Oral BID WC  . multivitamin  1 tablet Oral QHS  . pantoprazole  40 mg Oral BID  . polyethylene glycol  17 g Oral BID  . rosuvastatin  10 mg Oral Daily  . sodium zirconium cyclosilicate  10 g Oral Daily    BMET    Component Value Date/Time   NA 137 11/25/2020 0433   K 5.9 (H) 11/25/2020 0433   CL 97 (L) 11/25/2020 0433   CO2 20 (L) 11/25/2020 0433   GLUCOSE 96 11/25/2020 0433   BUN 33 (H) 11/25/2020 0433   CREATININE 5.89 (H) 11/25/2020 0433   CALCIUM 9.6 11/25/2020 0433   GFRNONAA 8 (L) 11/25/2020 0433   GFRAA 5 (L) 02/03/2019 0652   CBC    Component Value Date/Time   WBC 10.4  11/25/2020 0433   RBC 3.97 11/25/2020 0433   HGB 11.3 (L) 11/25/2020 0433   HCT 33.3 (L) 11/25/2020 0433   PLT 170 11/25/2020 0433   MCV 83.9 11/25/2020 0433   MCH 28.5 11/25/2020 0433   MCHC 33.9 11/25/2020 0433   RDW 23.0 (H) 11/25/2020 0433   LYMPHSABS 1.1 11/16/2020 1902   MONOABS 0.6 11/16/2020 1902   EOSABS 0.0 11/16/2020 1902   BASOSABS 0.0 11/16/2020 1902

## 2020-11-27 DIAGNOSIS — E0590 Thyrotoxicosis, unspecified without thyrotoxic crisis or storm: Secondary | ICD-10-CM

## 2020-11-27 DIAGNOSIS — I82403 Acute embolism and thrombosis of unspecified deep veins of lower extremity, bilateral: Secondary | ICD-10-CM

## 2020-11-27 DIAGNOSIS — I4891 Unspecified atrial fibrillation: Secondary | ICD-10-CM | POA: Diagnosis not present

## 2020-11-27 DIAGNOSIS — G9389 Other specified disorders of brain: Secondary | ICD-10-CM

## 2020-11-27 DIAGNOSIS — S62102A Fracture of unspecified carpal bone, left wrist, initial encounter for closed fracture: Secondary | ICD-10-CM

## 2020-11-27 LAB — RENAL FUNCTION PANEL
Albumin: 2.1 g/dL — ABNORMAL LOW (ref 3.5–5.0)
Anion gap: 17 — ABNORMAL HIGH (ref 5–15)
BUN: 35 mg/dL — ABNORMAL HIGH (ref 6–20)
CO2: 25 mmol/L (ref 22–32)
Calcium: 9.3 mg/dL (ref 8.9–10.3)
Chloride: 94 mmol/L — ABNORMAL LOW (ref 98–111)
Creatinine, Ser: 6.21 mg/dL — ABNORMAL HIGH (ref 0.44–1.00)
GFR, Estimated: 7 mL/min — ABNORMAL LOW (ref >60)
Glucose, Bld: 85 mg/dL (ref 70–99)
Phosphorus: 7 mg/dL — ABNORMAL HIGH (ref 2.5–4.6)
Potassium: 4.4 mmol/L (ref 3.5–5.1)
Sodium: 136 mmol/L (ref 135–145)

## 2020-11-27 LAB — CBC
HCT: 33.7 % — ABNORMAL LOW (ref 36.0–46.0)
Hemoglobin: 11.7 g/dL — ABNORMAL LOW (ref 12.0–15.0)
MCH: 28.7 pg (ref 26.0–34.0)
MCHC: 34.7 g/dL (ref 30.0–36.0)
MCV: 82.6 fL (ref 80.0–100.0)
Platelets: 170 10*3/uL (ref 150–400)
RBC: 4.08 MIL/uL (ref 3.87–5.11)
RDW: 22.1 % — ABNORMAL HIGH (ref 11.5–15.5)
WBC: 13.8 10*3/uL — ABNORMAL HIGH (ref 4.0–10.5)
nRBC: 0.6 % — ABNORMAL HIGH (ref 0.0–0.2)

## 2020-11-27 MED ORDER — METOPROLOL TARTRATE 25 MG PO TABS: 25.0000 mg | ORAL_TABLET | Freq: Two times a day (BID) | ORAL | 0 refills | Status: DC

## 2020-11-27 MED ORDER — RENA-VITE PO TABS: 1.0000 | ORAL_TABLET | Freq: Every day | ORAL | 0 refills | Status: DC

## 2020-11-27 MED ORDER — NEPRO/CARBSTEADY PO LIQD: 237.0000 mL | Freq: Three times a day (TID) | ORAL | Status: DC

## 2020-11-27 MED ORDER — MIDODRINE HCL 5 MG PO TABS
ORAL_TABLET | ORAL | Status: AC
  Filled 2020-11-27: qty 2

## 2020-11-27 MED ORDER — MIDODRINE HCL 10 MG PO TABS: 10.0000 mg | ORAL_TABLET | Freq: Two times a day (BID) | ORAL | 2 refills | Status: DC

## 2020-11-27 MED ORDER — POLYETHYLENE GLYCOL 3350 17 G PO PACK: 17.0000 g | PACK | Freq: Two times a day (BID) | ORAL | 0 refills | Status: DC

## 2020-11-27 MED ORDER — PANTOPRAZOLE SODIUM 40 MG PO TBEC: 40.0000 mg | DELAYED_RELEASE_TABLET | Freq: Every day | ORAL | 0 refills | Status: DC

## 2020-11-27 MED ORDER — LEVETIRACETAM 500 MG PO TABS: 500.0000 mg | ORAL_TABLET | Freq: Two times a day (BID) | ORAL | 0 refills | Status: DC

## 2020-11-27 MED ORDER — AMIODARONE HCL 200 MG PO TABS: 200.0000 mg | ORAL_TABLET | Freq: Every day | ORAL | Status: DC

## 2020-11-27 NOTE — Progress Notes (Signed)
Hemodialysis- Patient not eating per report. Patient complaining of hunger, attempted to get patient to have snack. Chews a couple bites and then spits out and doesn't want anymore. Tried breakfast sandwich and graham crackers. Able to drink juice. Continue to monitor.

## 2020-11-27 NOTE — Progress Notes (Signed)
    Durable Medical Equipment  (From admission, onward)         Start     Ordered   11/27/20 1247  For home use only DME lightweight manual wheelchair with seat cushion  Once       Comments: Patient suffers from weakness which impairs their ability to perform daily activities like bathing and dressing in the home.  A walker or cane will not resolve  issue with performing activities of daily living. A wheelchair will allow patient to safely perform daily activities. Patient is not able to propel themselves in the home using a standard weight wheelchair due to weakness. Patient can self propel in the lightweight wheelchair. Length of need lifetime. Accessories: elevating leg rests (ELRs), wheel locks, extensions and anti-tippers.   11/27/20 1247   11/27/20 0911  For home use only DME Walker rolling  Once       Question Answer Comment  Walker: With [place]   Patient needs a walker to treat with the following condition Weakness      11/27/20 0910   11/27/20 0911  For home use only DME Bedside commode  Once       Question:  Patient needs a bedside commode to treat with the following condition  Answer:  Weakness   11/27/20 0910

## 2020-11-27 NOTE — Progress Notes (Signed)
Nutrition Follow-up  DOCUMENTATION CODES:   Non-severe (moderate) malnutrition in context of chronic illness  INTERVENTION:   -Continue Nepro Shake po TID, each supplement provides 425 kcal and 19 grams protein -Continue renal MVI daily -Continue Magic cup TID with meals, each supplement provides 290 kcal and 9 grams of protein  NUTRITION DIAGNOSIS:   Moderate Malnutrition related to chronic illness (ESRD on HD) as evidenced by mild fat depletion,moderate fat depletion,moderate muscle depletion,severe muscle depletion.  Ongoing  GOAL:   Patient will meet greater than or equal to 90% of their needs  Progressing   MONITOR:   PO intake,Supplement acceptance,Labs,Weight trends,Skin,I & O's  REASON FOR ASSESSMENT:   Consult Assessment of nutrition requirement/status  ASSESSMENT:   [name] is a 60 y.o. female with medical history significant of ESRD on HD TTS, CAD, hypertension, hyperlipidemia, paroxysmal A. 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  Patient is a poor historian and able to provide limited history.  Reports 1 week history of nausea, vomiting, and epigastric abdominal pain.  She is not sure when she goes for dialysis.  Denies fevers, chills, cough, shortness of breath, or chest pain.  She is not vaccinated against COVID.  1/23- s/p BSE- advanced to regular consistency diet with thin liquids 1/27- s/pOpen reduction and internal fixation of left extra-articular distal radius fracture 2/7- s/p rt thoracentesis (1 L fluid removed) secondary to bilateral pleural effusions  Reviewed I/O's: +120 ml x 24 hours and -449 ml since 11/13/20  Pt unavailable at time of visit.   Intake has improved since last visit, however, still variable. Noted documented meal completions 50-95%.  Pt with variable acceptance of Nepro Shakes.  Medications reviewed and include keppra, miralax, and lokelma.   Per TOC notes, pt's family prefers for pt to return home at discharge.   Labs reviewed.   Diet Order:   Diet Order            Diet regular Room service appropriate? Yes; Fluid consistency: Thin  Diet effective now                 EDUCATION NEEDS:   Not appropriate for education at this time  Skin:  Skin Assessment: Skin Integrity Issues: Skin Integrity Issues:: Other (Comment) Incisions: closed rt arm Other: open, non-blanchable wound to mid sacrum  Last BM:  11/26/20  Height:   Ht Readings from Last 1 Encounters:  10/26/20 '5\' 1"'$  (1.549 m)    Weight:   Wt Readings from Last 1 Encounters:  11/27/20 41.4 kg    Ideal Body Weight:  47.7 kg  BMI:  Body mass index is 17.25 kg/m.  Estimated Nutritional Needs:   Kcal:  1550-1750  Protein:  80-95 grams  Fluid:  1000 ml + UOP    Loistine Chance, RD, LDN, CDCES Registered Dietitian II Certified Diabetes Care and Education Specialist Please refer to [facility] for RD and/or RD on-call/weekend/after hours pager

## 2020-11-27 NOTE — Discharge Instructions (Signed)
[name],  You were in the hospital with many different issues including COVID-19 pneumonia, atrial fibrillation, ESRD needing hemodialysis, fluid around your brain, seizures and a wrist fracture. You have been stabilized well. Please take medications as prescribed. You will need to follow-up with neurosurgery and orthopedic surgery.

## 2020-11-27 NOTE — Care Management Important Message (Signed)
Important Message  Patient Details  Name: [name] MRN: [id number] Date of Birth: 1961/07/31   Medicare Important Message Given:  Yes     Shelda Altes 11/27/2020, 10:53 AM

## 2020-11-27 NOTE — Progress Notes (Signed)
Manufacturing engineer [facility])  Hospital Liaison: RN note         Notified by [facility] manager of patient/family request for [facility] Palliative services at home after discharge.              [place] Palliative team will follow up with patient after discharge.         Please call with any hospice or palliative related questions.         Thank you for this referral.         Farrel Gordon, RN, CCM  [place] (listed on [place] under Hosp[facility])    [phone]

## 2020-11-27 NOTE — Progress Notes (Addendum)
Physical Therapy Treatment Patient Details Name: [name] MRN: [id number] DOB: 27-Jan-1961 Today's Date: 11/27/2020    History of Present Illness 60 y.o. female with medical history significant of ESRD on HD TTS, CAD, hypertension, hyperlipidemia, paroxysmal A. 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 Found to be in A-fib with RVR, unvaccinated, COVID+, CT showing small L pleural effusion at the L lung base. Reports L wrist pain from daughter falling on her wrist prior to coming to ED found to have fx and  L UE wrist splint applied in ED. NWB will eventually require ORIF. Rapid response 11/09/20 transfer to ICU, placement of untunneled R femoral central venous catheter, D/c'd 11/11/20    PT Comments    Pt making incremental progress towards her physical therapy goals. Session focused on LUE ROM and functional mobility. Pt requiring moderate assist for bed mobility; able to stand with minimal assist, however, unable to weight shift to take steps at edge of bed. Displays generalized weakness, decreased functional use of LUE, poor balance, and decreased endurance. Continue to recommend SNF for ongoing Physical Therapy.      Follow Up Recommendations  SNF     Equipment Recommendations  3in1 (PT);Wheelchair (measurements PT);Wheelchair cushion (measurements PT)    Recommendations for Other Services       Precautions / Restrictions Precautions Precautions: Fall Required Braces or Orthoses: Splint/Cast Splint/Cast: L wrist volar splint Restrictions Weight Bearing Restrictions: Yes LUE Weight Bearing: Non weight bearing Other Position/Activity Restrictions: R untunneled femoral cath no flexion past 90 degrees    Mobility  Bed Mobility Overal bed mobility: Needs Assistance Bed Mobility: Supine to  Sit;Sit to Supine Rolling: Mod assist   Supine to sit: Min assist     General bed mobility comments: Assist for initiation to edge of bed, guiding BLE's off and pulling trunk to upright. MinA for LE negotiation back into bed  Transfers Overall transfer level: Needs assistance Equipment used: None Transfers: Sit to/from Stand Sit to Stand: Min assist         General transfer comment: MinA to rise to stand from edge of bed. Cues for upright posture but pt unable to correct in addition to unable to weight shift to take steps  Ambulation/Gait                 Stairs             Wheelchair Mobility    Modified Rankin (Stroke Patients Only)       Balance Overall balance assessment: Needs assistance;History of Falls   Sitting balance-Leahy Scale: Fair     Standing balance support: No upper extremity supported;During functional activity Standing balance-Leahy Scale: Poor                              Cognition Arousal/Alertness: Awake/alert Behavior During Therapy: Flat affect Overall Cognitive Status: Impaired/Different from baseline Area of Impairment: Problem solving;Awareness;Safety/judgement;Following commands;Attention                   Current Attention Level: Selective Memory: Decreased recall of precautions;Decreased short-term memory Following Commands: Follows one step commands inconsistently;Follows one step commands with increased time Safety/Judgement: Decreased awareness of safety;Decreased awareness of deficits Awareness: Intellectual Problem Solving: Slow processing;Requires verbal cues;Requires tactile cues General Comments: Pt asking me  to take down pictures on the wall of the nursing director. Does not demonstrate awareness of injury to left wrist      Exercises General Exercises - Upper Extremity Shoulder Flexion: AROM;Left;10 reps;Supine Elbow Flexion: AROM;Left;10 reps;Supine Elbow Extension: AROM;Left;10  reps;Supine Digit Composite Flexion: AAROM;Left;10 reps;Supine Composite Extension: AAROM;Left;10 reps;Supine    General Comments        Pertinent Vitals/Pain Pain Assessment: Faces Faces Pain Scale: Hurts little more Pain Location: L wrist Pain Descriptors / Indicators: Guarding Pain Intervention(s): Monitored during session    Home Living                      Prior Function            PT Goals (current goals can now be found in the care plan section) Acute Rehab PT Goals Patient Stated Goal: to get home Potential to Achieve Goals: Fair    Frequency    Min 3X/week      PT Plan Frequency needs to be updated    Co-evaluation              AM-PAC PT "6 Clicks" Mobility   Outcome Measure  Help needed turning from your back to your side while in a flat bed without using bedrails?: A Lot Help needed moving from lying on your back to sitting on the side of a flat bed without using bedrails?: A Lot Help needed moving to and from a bed to a chair (including a wheelchair)?: A Lot Help needed standing up from a chair using your arms (e.g., wheelchair or bedside chair)?: A Little Help needed to walk in hospital room?: A Lot Help needed climbing 3-5 steps with a railing? : Total 6 Click Score: 12    End of Session Equipment Utilized During Treatment: Gait belt Activity Tolerance: Patient limited by fatigue Patient left: in bed;with call bell/phone within reach;with bed alarm set Nurse Communication: Mobility status PT Visit Diagnosis: Muscle weakness (generalized) (M62.81);Other abnormalities of gait and mobility (R26.89);Pain;Difficulty in walking, not elsewhere classified (R26.2) Pain - Right/Left: Left Pain - part of body: Hand;Arm     Time: 1141-1150 PT Time Calculation (min) (ACUTE ONLY): 9 min  Charges:  $Therapeutic Exercise: 8-22 mins                     Wyona Almas, PT, DPT Acute Rehabilitation Services Pager [phone] Office  [phone]    Deno Etienne 11/27/2020, 12:27 PM

## 2020-11-27 NOTE — Discharge Summary (Signed)
Physician Discharge Summary  [name] [id number] DOB: May 11, 1961 DOA: 11/08/2020  PCP: Clovia Cuff, MD  Admit date: 11/08/2020 Discharge date: 11/27/2020  Admitted From: Home Disposition: Home  Recommendations for Outpatient Follow-up:  1. Follow up with PCP in 1 week 2. Follow up with orthopedic surgery, neurosurgery, endocrinology, cardiology 3. Please follow up on the following pending results: None  Home Health: PT, RN, Aide Equipment/Devices: Bedside commode, wheelchair, rolling walker  Discharge Condition: Stable CODE STATUS: Full code Diet recommendation: Renal diet   Brief/Interim Summary:  Admission HPI written by Shela Leff, MD   Chief Complaint: Nausea, vomiting, abdominal pain  HPI: [name] is a 60 y.o. female with medical history significant of ESRD on HD TTS, CAD, hypertension, hyperlipidemia, paroxysmal A. 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  Patient is a poor historian and able to provide limited history.  Reports 1 week history of nausea, vomiting, and epigastric abdominal pain.  She is not sure when she goes for dialysis.  Denies fevers, chills, cough, shortness of breath, or chest pain.  She is not vaccinated against COVID.   Hospital course:  COVID-19 pneumonia Patient found to have bilateral peripheral opacities on chest x-ray without associated hypoxia. Patient received Remdesivir for treatment of COVID-19 infection/pneumonia. No steroids were initiated secondary to no hypoxia. Patient also was treated for Cefepime IV for 5 days.  Acute on chronic anemia Chronic component in setting of kidney disease. Unsure of etiology of acute component. Baseline appears to be around 10-11 with a drop as low as 6.5. patient received 2 units of PRBC  on 1/21 with hemoglobin back up to 10.7 and has remained stable.  Acute respiratory distress On 2/2, patient developed respiratory distress with respiratory rate to the mid-high 30s requiring placement of oxygen via [place]. Chest x-ray significant for pleural effusions in addition to pulmonary edema. Symptoms resolved. She did not receive lasix. On repeat chest x-ray, she has evidence of pleural effusions. She underwent a right thoracentesis yielding 1 L of fluid with improvement in respiratory status.  Constipation/Fecal impaction Resolved with bowel regimen.  Hyperkalemia In setting of ESRD. Management with HD. Nephrology prescribing Lokelma.  Paroxysmal atrial fibrillation RVR overnight on 2/4 and started on amiodarone drip. Transitioned back to amiodarone PO and started on metoprolol. Likely contributed to by hyperthyroidism.   ESRD on HD Nephrology consulted for HD  Right LE DVT Chronic. Not started on anticoagulation secondary to history of intracranial hemorrhage. IR consulted for consideration of IVC filter which was not recommended at the time.  Left wrist fracture Orthopedic surgery consulted. Patient underwent ORIF on 1/27. Splint in place with recommendations to follow-up for splint/suture removal 2 weeks from 1/27.  Moderate malnutrition Dietitian consulted with recommendations for MVI and Nepro shake  Acute metabolic encephalopathy Likely multifactorial. Patient with evidence of subdural collection from CT head. Neurosurgery consulted with recommendation for conservative management.  Bilateral subdural fluid collection Associated mass effect. Neurosurgery consulted as mentioned above with recommendation for non-operative management  Left parietal lobe hypodensity Concerning for possible glioma. Unable to perform MRI secondary to MRI incompatible aneurysm clips. Neurosurgery recommends no treatment/follow-up.  Hyperthyroidism TSH low at 0.010 with elevated free  T4 of 2.87. Normal T3. Recommend Endocrinology follow-up. Symptom management with beta blocker. She received methimazole but this was discontinued on discharge.  Discharge Diagnoses:  Principal Problem:   Atrial fibrillation with rapid ventricular response (HCC) Active Problems:   CAD (coronary artery disease)   Seizure disorder (HCC)   Goals of care, counseling/discussion   End-stage renal disease on hemodialysis (HCC)   Epigastric abdominal pain   Malnutrition of moderate degree    Discharge Instructions   Allergies as of 11/27/2020      Reactions   Amoxicillin Hives, Rash   Informed by pt's daughter Larquita Bolton    Penicillins Hives, Rash   Has patient had a PCN reaction causing immediate rash, facial/tongue/throat swelling, SOB or lightheadedness with hypotension: Yes Has patient had a PCN reaction causing severe rash involving mucus membranes or skin necrosis: No Has patient had a PCN reaction that required hospitalization: No Has patient had a PCN reaction occurring within the last 10 years: No If all of the above answers are "NO", then may proceed with Cephalosporin use. PATIENT TOLERATED CEFAZOLIN IN OR 11/15/20      Medication List    STOP taking these medications   sucroferric oxyhydroxide 500 MG chewable tablet Commonly known as: Velphoro     TAKE these medications   amiodarone 200 MG tablet Commonly known as: PACERONE Take 2 tabs ('400mg'$ ) daily for 7 days, then 1 tab ('200mg'$ ) daily thereafter   aspirin 81 MG EC tablet Take 81 mg by mouth every 8 (eight) hours as needed for pain.   calcitRIOL 0.5 MCG capsule Commonly known as: ROCALTROL Take 1 capsule (0.5 mcg total) by mouth Every Tuesday,Thursday,and Saturday with dialysis.   calcium acetate 667 MG capsule Commonly known as: PHOSLO Take 667 mg by mouth 3 (three) times daily.   feeding supplement (NEPRO CARB STEADY) Liqd Take 237 mLs by mouth 3 (three) times daily between meals.   levETIRAcetam 500  MG tablet Commonly known as: KEPPRA Take 1 tablet (500 mg total) by mouth 2 (two) times daily.   metoprolol tartrate 25 MG tablet Commonly known as: LOPRESSOR Take 1 tablet (25 mg total) by mouth 2 (two) times daily.   midodrine 10 MG tablet Commonly known as: PROAMATINE Take 1 tablet (10 mg total) by mouth 2 (two) times daily with a meal.   multivitamin Tabs tablet Take 1 tablet by mouth at bedtime.   nitroGLYCERIN 0.4 MG SL tablet Commonly known as: NITROSTAT Place 1 tablet (0.4 mg total) under the tongue every 5 (five) minutes as needed for chest pain.   pantoprazole 40 MG tablet Commonly known as: PROTONIX Take 1 tablet (40 mg total) by mouth daily.   polyethylene glycol 17 g packet Commonly known as: MIRALAX / GLYCOLAX Take 17 g by mouth 2 (two) times daily.   rosuvastatin 10 MG tablet Commonly known as: CRESTOR Take 10 mg by mouth daily.            Durable Medical Equipment  (From admission, onward)         Start     Ordered   11/27/20 1247  For home use only DME lightweight manual wheelchair with seat cushion  Once       Comments: Patient suffers from weakness which impairs their ability to perform daily activities like bathing and dressing in the home.  A walker or cane will not resolve  issue with performing activities of daily living. A wheelchair will allow patient to safely perform daily activities. Patient is not able to propel themselves in the home using a standard weight wheelchair due to weakness. Patient can self propel in the lightweight wheelchair. Length  of need lifetime. Accessories: elevating leg rests (ELRs), wheel locks, extensions and anti-tippers.   11/27/20 1247   11/27/20 0911  For home use only DME Walker rolling  Once       Question Answer Comment  Walker: With [facility]   Patient needs a walker to treat with the following condition Weakness      11/27/20 0910   11/27/20 0911  For home use only DME Bedside commode  Once        Question:  Patient needs a bedside commode to treat with the following condition  Answer:  Weakness   11/27/20 0910          Follow-up Information    Clovia Cuff, MD.   Specialty: Internal Medicine Why: Please follow up in a week Contact information: [address] [place] [address] [phone]        [facility] Follow up.   Specialty: [place] Why: HHRN,HHPT, HHAIDE Contact information: [address] [phone]        [facility] Follow up.   Why: rolling walker , [facility] Contact information: [place] [address] [phone]        [facility] Follow up.   Why: outpatient palliative servcies Contact information: [place] [address] [phone]             Allergies  Allergen Reactions  . Amoxicillin Hives and Rash    Informed by pt's daughter [name]   . Penicillins Hives and Rash    Has patient had a PCN reaction causing immediate rash, facial/tongue/throat swelling, SOB or lightheadedness with hypotension: Yes Has patient had a PCN reaction causing severe rash involving mucus membranes or skin necrosis: No Has patient had a PCN reaction that required hospitalization: No Has patient had a PCN reaction occurring within the last 10 years: No If all of the above answers are "NO", then may proceed with Cephalosporin use. PATIENT TOLERATED CEFAZOLIN IN OR 11/15/20     Consultations:  Orthopedic surgery  Neurosurgery  Cardiology  Nephrology  PCCM  Gastroenterology   Procedures/Studies: CT ABDOMEN PELVIS WO CONTRAST  Result Date: 11/09/2020 CLINICAL DATA:  Acute generalized abdominal pain. EXAM: CT ABDOMEN AND PELVIS WITHOUT CONTRAST TECHNIQUE: Multidetector CT imaging of the abdomen and pelvis was performed following the standard protocol without IV contrast. COMPARISON:  November 08, 2020.  FINDINGS: Lower chest: Increased left basilar opacity is noted concerning for worsening pneumonia or atelectasis. Mild right posterior basilar subsegmental atelectasis is noted. Hepatobiliary: No gallstones or biliary dilatation is noted. Multiple rounded hepatic low densities are noted most consistent with cysts. Pancreas: No definite evidence of acute inflammation is noted. Stable mild nonspecific ductal dilatation is noted. Spleen: Normal in size without focal abnormality. Adrenals/Urinary Tract: Adrenal glands appear normal. Bilateral renal atrophy is noted consistent with end-stage renal disease. Left renal cysts are noted. No hydronephrosis or renal obstruction is noted. Urinary bladder is unremarkable. Stomach/Bowel: Stomach is within normal limits. Appendix appears normal. Large amount of stool is again noted in the rectum concerning for impaction. No abnormal bowel dilatation is otherwise noted. Vascular/Lymphatic: Aortic atherosclerosis. No enlarged abdominal or pelvic lymph nodes. Reproductive: Status post hysterectomy. No adnexal masses. Other: Ventriculoperitoneal shunt is noted with tip in left side of abdomen. No hernia or ascites is noted. Musculoskeletal: No acute or significant osseous findings. IMPRESSION: 1. Increased left basilar opacity is noted concerning for worsening pneumonia  or atelectasis. 2. Bilateral renal atrophy is noted consistent with end-stage renal disease. 3. Aortic atherosclerosis. 4. Large amount of stool is again noted in the rectum concerning for impaction. Aortic Atherosclerosis (ICD10-I70.0). Electronically Signed   By: Marijo Conception M.D.   On: 11/09/2020 15:05   DG Chest 1 View  Result Date: 11/26/2020 CLINICAL DATA:  Right thoracentesis EXAM: CHEST  1 VIEW COMPARISON:  11/26/2020 FINDINGS: Single frontal view of the chest demonstrates decreased right pleural effusion after recent thoracentesis, with small residual right pleural effusion. Increased aeration at the  right lung base, with moderate residual atelectasis or airspace disease. Stable left basilar consolidation and effusion. No evidence of pneumothorax. Stable catheter in the left supraclavicular region, right-sided ventriculostomy catheter, and right-sided dialysis catheter. Loop recorder within the left anterior chest unchanged. Cardiac silhouette is stable. IMPRESSION: 1. Decreased right pleural effusion after thoracentesis. No evidence of complication. 2. Improving aeration at the right lung base, with moderate residual consolidation and/or atelectasis. 3. Stable left basilar consolidation and effusion. Electronically Signed   By: Randa Ngo M.D.   On: 11/26/2020 15:30   DG Chest 2 View  Result Date: 11/08/2020 CLINICAL DATA:  Nausea x1 day. EXAM: CHEST - 2 VIEW COMPARISON:  October 15, 2020 FINDINGS: The lateral views limited in evaluation secondary to positioning of the patient's upper extremities. There is stable right-sided venous catheter positioning. Tubing from a ventriculoperitoneal shunt is also seen. Mild atelectasis and/or infiltrate is seen within the retrocardiac region of the left lung base. There is no evidence of a pleural effusion or pneumothorax. The cardiac silhouette is mildly enlarged. Marked severity calcification of the thoracic aorta is seen. The visualized skeletal structures are unremarkable. IMPRESSION: Mild left basilar atelectasis and/or infiltrate. Electronically Signed   By: Virgina Norfolk M.D.   On: 11/08/2020 19:52   DG Wrist 2 Views Left  Result Date: 11/09/2020 CLINICAL DATA:  Fall EXAM: LEFT WRIST - 2 VIEW COMPARISON:  None. FINDINGS: There is a comminuted impacted distal radius fracture with slight posterior angulation. There is a non displaced ulnar styloid fracture. Overlying soft tissue swelling is seen. IMPRESSION: Comminuted impacted distal radius fracture. Nondisplaced ulnar styloid fracture. Electronically Signed   By: Prudencio Pair M.D.   On: 11/09/2020  02:22   CT HEAD WO CONTRAST  Result Date: 11/19/2020 CLINICAL DATA:  Subdural hematoma, shunted hydrocephalus, follow-up EXAM: CT HEAD WITHOUT CONTRAST TECHNIQUE: Contiguous axial images were obtained from the base of the skull through the vertex without intravenous contrast. COMPARISON:  11/17/2020 FINDINGS: Brain: Thin left cerebral convexity hypodense subdural collection is stable in size. Trace right cerebral convexity hypodense subdural collection is also unchanged. No significant mass effect. Ventricles are stable in caliber. Right frontal approach shunt catheter is in stable position. 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 Additional patchy and confluent areas of hypoattenuation likely reflecting stable chronic microvascular ischemic changes. There is gliosis along the catheter tract. No new intracranial hemorrhage. No new loss of gray-white differentiation. 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 Coil packing at the basilar tip with associated streak artifact.There is atherosclerotic calcification at the skull base. Skull: Right craniotomy and burr hole. Sinuses/Orbits: No acute finding. Other: None. IMPRESSION: No substantial change in thin cerebral convexity subdural collections. No significant mass effect. Stable ventricle caliber and positioning of shunt catheter. Electronically Signed   By: Macy Mis M.D.   On: 11/19/2020 09:30  CT HEAD WO CONTRAST  Result Date: 11/17/2020 CLINICAL DATA:  60 year old female with acute mental status changes EXAM: CT HEAD WITHOUT CONTRAST TECHNIQUE: Contiguous axial images were obtained from the base of the skull through the vertex without intravenous contrast. COMPARISON:  10/06/2020, 02/16/2018, 11/11/2013 FINDINGS: Brain: New bilateral frontal parietal extra-axial fluid collections, slightly larger on the left, intermediate  density. On the left, fluid extends over the posterior temporal region. The greatest depth measured on the left from the inner table to the cortex is approximately 7 mm. On the right the greatest depth estimated is 4 mm. Slight local mass effect of the underlying brain parenchyma. Confluent hypodensity in the left posterior sylvian/parietal region, demonstrated on sequential CT studies dating to 2015. Internal density is somewhat higher than expected for chronic infarction, without contraction. Right frontal approach ventriculostomy again terminating left of midline at the third ventricle. Margin of hypodensity along the tract is again demonstrated with no acute hemorrhage. Focal hypodensity in the left cerebellar hemisphere, unchanged. Coil mass with associated artifact at the basilar tip. Surgical clips of right MCA aneurysms, with associated artifact. Subcortical hypodensity in the left frontal and parietal region, unchanged from prior. No acute intracranial hemorrhage.  No midline shift. Vascular: Atherosclerotic calcifications. Surgical clips of right MCA aneurysm, paraclinoid and at the M 2 distribution. Coil mass at the basilar tip again demonstrated. Skull: Surgical changes of prior right pterional craniotomy. Surgical changes of right frontal approach ventriculostomy with the tip of the shunt drain terminating left of midline in the third ventricle. Sinuses/Orbits: Unremarkable paranasal sinuses. Other: None IMPRESSION: New bilateral frontoparietal subdural collection, mixed density with mild mass effect of the underlying brain. The left a slightly thicker measuring 7 mm, and also extends inferiorly over the temporal lobe. Right-sided collection measures 4 mm. The relatively hypodense region of the left parietal lobe is more dense than expected for an infarction that occurred in 2015, without the expected contraction/evolution of typical infarction appearance. A low grade glioma should be considered,  alternatively local infection and further evaluation with contrast MRI is recommended. Redemonstration of surgical changes of prior right MCA aneurysm clipping x2, basilar tip aneurysm coiling, right frontal approach ventriculostomy drain, as above. Electronically Signed   By: Corrie Mckusick D.O.   On: 11/17/2020 14:44   CT ABDOMEN PELVIS W CONTRAST  Result Date: 11/08/2020 CLINICAL DATA:  Epigastric pain EXAM: CT ABDOMEN AND PELVIS WITH CONTRAST TECHNIQUE: Multidetector CT imaging of the abdomen and pelvis was performed using the standard protocol following bolus administration of intravenous contrast. CONTRAST:  38m OMNIPAQUE IOHEXOL 300 MG/ML  SOLN COMPARISON:  None. FINDINGS: Lower chest: The visualized heart size within normal limits. No pericardial fluid/thickening. No hiatal hernia. A small left pleural effusion is seen. Patchy airspace opacity seen at the left lung base. Hepatobiliary: Multiple low-density lesions are seen throughout the liver parenchyma the largest measuring 1.2 cm in the superior right liver lobe.The main portal vein is patent. No evidence of calcified gallstones, gallbladder wall thickening or biliary dilatation. Pancreas: There is mild pancreatic ductal dilatation measuring up to 2 mm. This is nonspecific however. Spleen: Normal in size without focal abnormality. Adrenals/Urinary Tract: Both adrenal glands appear normal. Mild bilateral renal atrophy seen. There is scattered calcification the largest measuring 3 mm in the midpole of the right kidney. No hydronephrosis. Bladder is unremarkable. Stomach/Bowel: The stomach, small bowel, are normal in appearance. There is scattered colonic diverticula. There is a moderately distended rectal fecal ball. Vascular/Lymphatic: There are no enlarged mesenteric,  retroperitoneal, or pelvic lymph nodes. Scattered aortic atherosclerotic calcifications are seen without aneurysmal dilatation. Reproductive: The patient is status post hysterectomy. No  adnexal masses or collections seen. Other: No evidence of abdominal wall mass or hernia. Musculoskeletal: No acute or significant osseous findings. IMPRESSION: Small left pleural effusion with patchy airspace opacity at the left lung base which may be due to atelectasis and/or infectious etiology Nonspecific mild pancreatic ductal dilatation Diverticulosis without diverticulitis Moderately dilated rectum with a fecal ball Aortic Atherosclerosis (ICD10-I70.0). Electronically Signed   By: Prudencio Pair M.D.   On: 11/08/2020 20:34   DG CHEST PORT 1 VIEW  Result Date: 11/26/2020 CLINICAL DATA:  Tachypnea EXAM: PORTABLE CHEST 1 VIEW COMPARISON:  November 25, 2020 FINDINGS: Central catheter tip is at the cavoatrial junction, stable. A shunt extends along the right hemithorax. Loop recorder again noted on the left. No pneumothorax. There is moderate partially loculated pleural effusion on the right with smaller pleural effusion on the left. There is airspace opacity in each lower lung region and right mid lung region, stable. Heart is mildly enlarged with pulmonary venous hypertension, stable. No adenopathy. There is aortic atherosclerosis. No bone lesions. IMPRESSION: Stable catheter positions. No pneumothorax. Pleural effusions bilaterally, larger on the right than the left. Areas of airspace opacity in the right mid lung and bibasilar regions, stable. Stable cardiac silhouette. Aortic Atherosclerosis (ICD10-I70.0). Electronically Signed   By: Lowella Grip III M.D.   On: 11/26/2020 08:03   DG CHEST PORT 1 VIEW  Result Date: 11/25/2020 CLINICAL DATA:  60 year old female with hypoxia. EXAM: PORTABLE CHEST 1 VIEW COMPARISON:  Portable chest 11/21/2020 and earlier. FINDINGS: Portable AP semi upright view at 0736 hours. Stable right chest dual lumen dialysis type catheter. Left chest cardiac loop recorder or external defibrillator and right neck and chest shunt catheter are stable. Calcified aortic atherosclerosis.  Decreased but not resolved veiling opacity in the left lower lung. Continued dense lower lobe opacity. Moderate Veiling opacity not significantly changed in the right lung. No pneumothorax. Upper lung pulmonary vascularity appears normalized. No air bronchograms. Stable cardiac size and mediastinal contours. Visualized tracheal air column is within normal limits. No acute osseous abnormality identified. IMPRESSION: 1. Regressed pulmonary edema since 11/21/2020. Moderate right and small left pleural effusions persist with lower lobe collapse or consolidation. 2. No new cardiopulmonary abnormality. Electronically Signed   By: Genevie Ann M.D.   On: 11/25/2020 09:15   DG Chest Port 1 View  Result Date: 11/22/2020 CLINICAL DATA:  Acute respiratory distress.  Rapid response. EXAM: PORTABLE CHEST 1 VIEW COMPARISON:  Radiograph 11/10/2020 FINDINGS: Dual lead right-sided dialysis catheter remains in place. Implanted limited loop recorder projects over the left chest wall. Presumed shunt catheter tubing projects over the right hemithorax. Small bore catheter projects over the supraclavicular soft tissues on the left. Progressive bilateral pleural effusions from prior exam, right greater than left. Development of pulmonary edema. Cardiomegaly is grossly similar. Dense aortic atherosclerosis. No pneumothorax. IMPRESSION: 1. Bilateral pleural effusions, moderately large on the right and small to moderate on the left. 2. Progressive pulmonary edema. 3. Chronic cardiomegaly.  Aortic Atherosclerosis (ICD10-I70.0). Electronically Signed   By: Keith Rake M.D.   On: 11/22/2020 00:01   DG Chest Port 1 View  Result Date: 11/09/2020 CLINICAL DATA:  Shortness of breath EXAM: PORTABLE CHEST 1 VIEW COMPARISON:  November 08, 2020 FINDINGS: Central catheter tip is at the cavoatrial junction. There is a intraperitoneal shunt catheter extending along the medial right hemithorax, stable. There is  ill-defined airspace opacity in the left  lower lobe. Lungs elsewhere clear. Heart is mildly enlarged with pulmonary venous hypertension. There is aortic atherosclerosis. No adenopathy. There is a loop recorder on the left. No bone lesions. IMPRESSION: Central catheter position unchanged. No pneumothorax. Airspace opacity likely representing pneumonia left lower lobe. Lungs elsewhere clear. There is mild cardiomegaly with a degree of pulmonary vascular congestion. Aortic Atherosclerosis (ICD10-I70.0). Electronically Signed   By: Lowella Grip III M.D.   On: 11/09/2020 08:37   DG Abd Portable 1V  Result Date: 11/17/2020 CLINICAL DATA:  Constipation and abdominal distension EXAM: PORTABLE ABDOMEN - 1 VIEW COMPARISON:  Abdominal CT from 8 days ago FINDINGS: The bowel gas pattern is normal. No abnormal stool retention. Peritoneal catheter which loops in the pelvis. Extensive atheromatous calcification. Opacified retrocardiac lung with evidence of small right pleural effusion, unchanged from preceding CT. IMPRESSION: Normal bowel gas pattern.  No abnormal stool retention. Electronically Signed   By: Monte Fantasia M.D.   On: 11/17/2020 10:33   DG Abd Portable 1V  Result Date: 11/09/2020 CLINICAL DATA:  Abdominal pain, anemia EXAM: PORTABLE ABDOMEN - 1 VIEW COMPARISON:  11/08/2020 CT abdomen/pelvis FINDINGS: No disproportionately dilated small bowel loops. Moderate rectal stool, similar. Mild to moderate colonic stool and gas. No evidence of pneumatosis or pneumoperitoneum. Right-sided VP shunt catheter terminates in the left abdomen and appears intact and without kink. No radiopaque nephrolithiasis. IMPRESSION: Nonobstructive bowel gas pattern. Moderate rectal stool, similar to recent CT. Mild to moderate colonic stool and gas. Electronically Signed   By: Ilona Sorrel M.D.   On: 11/09/2020 10:11   DG MINI C-ARM IMAGE ONLY  Result Date: 11/15/2020 There is no interpretation for this exam.  This order is for images obtained during a surgical  procedure.  Please See "Surgeries" Tab for more information regarding the procedure.   VAS [place] LOWER EXTREMITY VENOUS (DVT)  Result Date: 11/17/2020  Lower Venous DVT Study Indications: Covid-19, Age indeterminate DVT found in the right common femoral vein 11/14/20, unable to receive anticoagulants secondary to [place].  Limitations: Pain with compression. Comparison Study: Prior study done 11/14/20 is available for comparison Performing Technologist: Sharion Dove RVS  Examination Guidelines: A complete evaluation includes B-mode imaging, spectral Doppler, color Doppler, and power Doppler as needed of all accessible portions of each vessel. Bilateral testing is considered an integral part of a complete examination. Limited examinations for reoccurring indications may be performed as noted. The reflux portion of the exam is performed with the patient in reverse Trendelenburg.  +---------+---------------+---------+-----------+----------+-------------------+ RIGHT    CompressibilityPhasicitySpontaneityPropertiesThrombus Aging      +---------+---------------+---------+-----------+----------+-------------------+ CFV      Full                                         pulsatile flow, age                                                       indeterminate       +---------+---------------+---------+-----------+----------+-------------------+ SFJ      Full                                                             +---------+---------------+---------+-----------+----------+-------------------+  FV Prox                                               patent by color and                                                       Doppler             +---------+---------------+---------+-----------+----------+-------------------+ FV Mid                                                patent by color and                                                       Doppler              +---------+---------------+---------+-----------+----------+-------------------+ FV Distal                                             patent by color and                                                       Doppler             +---------+---------------+---------+-----------+----------+-------------------+ POP                                                   pulsatile flow,                                                           patent by color and                                                       Doppler             +---------+---------------+---------+-----------+----------+-------------------+ PTV      Full                                                             +---------+---------------+---------+-----------+----------+-------------------+  PERO     Full                                                             +---------+---------------+---------+-----------+----------+-------------------+ small, age indeterminate thrombus attached to vein wall  +---------+---------------+---------+-----------+----------+--------------+ LEFT     CompressibilityPhasicitySpontaneityPropertiesThrombus Aging +---------+---------------+---------+-----------+----------+--------------+ CFV      Full                                         pulsatile flow +---------+---------------+---------+-----------+----------+--------------+ SFJ      Full                                                        +---------+---------------+---------+-----------+----------+--------------+ FV Prox  Full                                                        +---------+---------------+---------+-----------+----------+--------------+ FV Mid   Full                                                        +---------+---------------+---------+-----------+----------+--------------+ FV DistalFull                                                         +---------+---------------+---------+-----------+----------+--------------+ PFV      Full                                                        +---------+---------------+---------+-----------+----------+--------------+ POP      Full                                         pulsatile flow +---------+---------------+---------+-----------+----------+--------------+ PTV      Full                                                        +---------+---------------+---------+-----------+----------+--------------+     Summary: RIGHT: - Findings consistent with age indeterminate deep vein thrombosis involving the right common femoral vein. - Findings appear improved from previous examination.  LEFT: - No evidence of deep vein thrombosis in the lower extremity. No indirect evidence of obstruction  proximal to the inguinal ligament.  *See table(s) above for measurements and observations. Electronically signed by Jamelle Haring on 11/17/2020 at 2:46:36 PM.    Final    VAS [place] LOWER EXTREMITY VENOUS (DVT)  Result Date: 11/14/2020  Lower Venous DVT Study Indications: D-dimer, Covid-19.  Comparison Study: No prior studies. Performing Technologist: Darlin Coco RDMS  Examination Guidelines: A complete evaluation includes B-mode imaging, spectral Doppler, color Doppler, and power Doppler as needed of all accessible portions of each vessel. Bilateral testing is considered an integral part of a complete examination. Limited examinations for reoccurring indications may be performed as noted. The reflux portion of the exam is performed with the patient in reverse Trendelenburg.  +---------+---------------+---------+-----------+----------+-----------------+ RIGHT    CompressibilityPhasicitySpontaneityPropertiesThrombus Aging    +---------+---------------+---------+-----------+----------+-----------------+ CFV      Partial        No       Yes                  Age Indeterminate  +---------+---------------+---------+-----------+----------+-----------------+ SFJ      Full           No       Yes                                    +---------+---------------+---------+-----------+----------+-----------------+ FV Prox                 No       Yes                                    +---------+---------------+---------+-----------+----------+-----------------+ FV Mid                  No       Yes                                    +---------+---------------+---------+-----------+----------+-----------------+ FV Distal               No       Yes                                    +---------+---------------+---------+-----------+----------+-----------------+ PFV                     No       Yes                                    +---------+---------------+---------+-----------+----------+-----------------+ POP                     No       Yes                                    +---------+---------------+---------+-----------+----------+-----------------+ PTV                     No       Yes                                    +---------+---------------+---------+-----------+----------+-----------------+   +---------+---------------+---------+-----------+----------+--------------+  LEFT     CompressibilityPhasicitySpontaneityPropertiesThrombus Aging +---------+---------------+---------+-----------+----------+--------------+ CFV      Full           No       Yes                                 +---------+---------------+---------+-----------+----------+--------------+ SFJ      Full                                                        +---------+---------------+---------+-----------+----------+--------------+ FV Prox  Full                                                        +---------+---------------+---------+-----------+----------+--------------+ FV Mid   Full                                                         +---------+---------------+---------+-----------+----------+--------------+ FV DistalFull                                                        +---------+---------------+---------+-----------+----------+--------------+ PFV      Full           No       Yes                                 +---------+---------------+---------+-----------+----------+--------------+ POP      Full           No       Yes                                 +---------+---------------+---------+-----------+----------+--------------+ PTV      Full                                                        +---------+---------------+---------+-----------+----------+--------------+ PERO     Full                                                        +---------+---------------+---------+-----------+----------+--------------+     Summary: RIGHT: - Findings consistent with a partial, non-obstructing age indeterminate deep vein thrombosis involving the right common femoral vein. - No cystic structure found in the popliteal fossa.  LEFT: - There is no evidence of deep vein thrombosis in the lower extremity.  -  No cystic structure found in the popliteal fossa.  *See table(s) above for measurements and observations. Electronically signed by Harold Barban MD on 11/14/2020 at 9:38:42 PM.    Final    [place] EKG SITE RITE  Result Date: 11/09/2020 If Site Rite image not attached, placement could not be confirmed due to current cardiac rhythm.  IR THORACENTESIS ASP PLEURAL SPACE W/IMG GUIDE  Result Date: 11/26/2020 INDICATION: Patient with history of COVID-19 pneumonia, ESRD on HD, atrial fibrillation, dyspnea, and bilateral pleural effusions, R>L. Request is made for therapeutic right thoracentesis. EXAM: ULTRASOUND GUIDED THERAPEUTIC RIGHT THORACENTESIS MEDICATIONS: 7 mL 1% lidocaine COMPLICATIONS: None immediate. PROCEDURE: An ultrasound guided thoracentesis was thoroughly discussed with the patient and questions answered.  The benefits, risks, alternatives and complications were also discussed. The patient understands and wishes to proceed with the procedure. Written consent was obtained. Ultrasound was performed to localize and mark an adequate pocket of fluid in the right chest. The area was then prepped and draped in the normal sterile fashion. 1% Lidocaine was used for local anesthesia. Under ultrasound guidance a 6 Fr Safe-T-Centesis catheter was introduced. Thoracentesis was performed. The catheter was removed and a dressing applied. FINDINGS: A total of approximately 1 L of hazy gold fluid was removed. Procedure was stopped after 1 L per patient request due to patient's symptom (chest pain). IMPRESSION: Successful ultrasound guided right thoracentesis yielding 1 L of pleural fluid. Read by: Earley Abide, PA-C Electronically Signed   By: Corrie Mckusick D.O.   On: 11/26/2020 15:26       Subjective: No dyspnea.  Discharge Exam: Vitals:   11/27/20 0933 11/27/20 0937  BP: (!) 115/54 (!) 115/57  Pulse:    Resp: 19   Temp: 97.7 F (36.5 C)   SpO2: 97%    Vitals:   11/27/20 0900 11/27/20 0930 11/27/20 0933 11/27/20 0937  BP: (!) 119/58 (!) 115/54 (!) 115/54 (!) 115/57  Pulse:      Resp: '20 20 19   '$ Temp:   97.7 F (36.5 C)   TempSrc:   Oral   SpO2:   97%   Weight:   40.4 kg     General: Pt is alert, awake, not in acute distress Cardiovascular: RRR, S1/S2 +, no rubs, no gallops Respiratory: CTA bilaterally, no wheezing, no rhonchi Abdominal: Soft, NT, ND, bowel sounds + Extremities: no edema, no cyanosis    The results of significant diagnostics from this hospitalization (including imaging, microbiology, ancillary and laboratory) are listed below for reference.     Labs: BNP (last 3 results) Recent Labs    11/11/20 0530 11/11/20 2110 11/13/20 1027  BNP 3,906.9* >4,500.0* [id number]*   Basic Metabolic Panel: Recent Labs  Lab 11/23/20 2013 11/24/20 0600 11/24/20 0747 11/25/20 0433  11/27/20 0456  NA 138 138 139 137 136  K 6.3* 6.0* 5.0 5.9* 4.4  CL 99 97* 97* 97* 94*  CO2 '22 24 24 '$ 20* 25  GLUCOSE 136* 102* 93 96 85  BUN 45* 27* 25* 33* 35*  CREATININE 7.13* 4.79* 4.85* 5.89* 6.21*  CALCIUM 9.7 9.2 9.1 9.6 9.3  PHOS  --  5.3* 5.3* 7.0* 7.0*   Liver Function Tests: Recent Labs  Lab 11/24/20 0600 11/24/20 0747 11/25/20 0433 11/27/20 0456  ALBUMIN 2.1* 2.2* 2.3* 2.1*   CBC: Recent Labs  Lab 11/21/20 0516 11/23/20 2013 11/24/20 0747 11/25/20 0433 11/27/20 0456  WBC 9.8 13.2* 10.1 10.4 13.8*  HGB 11.0* 11.2* 10.9* 11.3* 11.7*  HCT 29.9* 33.4* 33.3* 33.3*  33.7*  MCV 75.9* 82.5 82.8 83.9 82.6  PLT 163 222 188 170 170   CBG: Recent Labs  Lab 11/22/20 1304  GLUCAP 119*   Urinalysis    Component Value Date/Time   COLORURINE YELLOW 11/08/2013 1304   APPEARANCEUR CLEAR 11/08/2013 1304   LABSPEC 1.009 11/08/2013 1304   PHURINE 7.5 11/08/2013 1304   GLUCOSEU NEGATIVE 11/08/2013 1304   HGBUR NEGATIVE 11/08/2013 1304   BILIRUBINUR NEGATIVE 11/08/2013 1304   KETONESUR NEGATIVE 11/08/2013 1304   PROTEINUR NEGATIVE 11/08/2013 1304   UROBILINOGEN 0.2 11/08/2013 1304   NITRITE NEGATIVE 11/08/2013 1304   LEUKOCYTESUR NEGATIVE 11/08/2013 1304    Time coordinating discharge: 35 minutes  SIGNED:   Cordelia Poche, MD [facility] 11/27/2020, 12:57 PM

## 2020-11-27 NOTE — Plan of Care (Signed)
  Problem: Safety: Goal: Ability to remain free from injury will improve Outcome: Progressing   

## 2020-11-27 NOTE — Progress Notes (Signed)
Subjective: Noted for discharge home, had dialysis earlier today, mumbled that she tolerated  Objective Vital signs in last 24 hours: Vitals:   11/27/20 0900 11/27/20 0930 11/27/20 0933 11/27/20 0937  BP: (!) 119/58 (!) 115/54 (!) 115/54 (!) 115/57  Pulse:      Resp: '20 20 19   '$ Temp:   97.7 F (36.5 C)   TempSrc:   Oral   SpO2:   97%   Weight:   40.4 kg    Weight change: -0.5 kg  Physical Exam: General: Thin cachectic appearing elderly female NAD opens eyes not much voiced squeezes hands to request Heart: RRR no MRG Lungs: Some decreased breath sounds bases otherwise CTA nonlabored Abdomen: Bowel sounds normoactive, soft NTND Extremities: No pedal edema Dialysis Access: Right upper arm AV fistula positive bruit   OP HD: TTS [facility] 3h 38kg 2/2 bath TDC Hep none - calc 120 ug tiw - mircera 75 q2 last 08/23/20 - velphoro 2 ac tid  - last HD 3hrs on 1/18, mostly compliant. - missed VVS post OP f/u - sp plication on [id number] RUE AVF    Problem/Plan:  1. COVID -19 viral pna - hazy opacities on CXR. SP remdesivir. Is nowout of isolation on 1/31 (10days). 2. Acute on chronic anemia - Hb 9.7 >> 11.7 after 2u prbc's on 1/21. gavearanesp 100 mcg on 1/22. GI consulted, conservative measures recommended. Not a/c candidate. 3. 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 Parox afib w/ RVR -no a/c, as above, IV amio now is po amio. Cards following 5. 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 Per primary team. 6. ESRD - HD TTS.  Today on schedule  7. Hyperkalemia-4.4 predialysis today improved with HD but still mildly elevated. Will start lokelma and follow.  8. L wrist fracture - sp ORIF 1/27 by ortho 9. Volume -requires midodrine predialysis today no vol excess on exam. CXR no edema, LLL infiltrate.  Today 2.458 mL UF. /Right pleural effusion  thoracentesis 1 L, per weight still 2 kg above bed wt'sprobnot correct.  She cannot stand, follow-up with outpatient center no current changes in EDW 10. Metabolic bone disease -P 7.0 binders when taking PO. Continue calcitriol as outpatient 11. GOC - very frail, multiple complicating medical problems. Per pmd possible dc home   Ernest Haber, PA-C [facility]per [phone] 11/27/2020,2:11 PM  LOS: 18 days   Labs: Basic Metabolic Panel: Recent Labs  Lab 11/24/20 0747 11/25/20 0433 11/27/20 0456  NA 139 137 136  K 5.0 5.9* 4.4  CL 97* 97* 94*  CO2 24 20* 25  GLUCOSE 93 96 85  BUN 25* 33* 35*  CREATININE 4.85* 5.89* 6.21*  CALCIUM 9.1 9.6 9.3  PHOS 5.3* 7.0* 7.0*   Liver Function Tests: Recent Labs  Lab 11/24/20 0747 11/25/20 0433 11/27/20 0456  ALBUMIN 2.2* 2.3* 2.1*   No results for input(s): LIPASE, AMYLASE in the last 168 hours. No results for input(s): AMMONIA in the last 168 hours. CBC: Recent Labs  Lab 11/21/20 0516 11/23/20 2013 11/24/20 0747 11/25/20 0433 11/27/20 0456  WBC 9.8 13.2* 10.1 10.4 13.8*  HGB 11.0* 11.2* 10.9* 11.3* 11.7*  HCT 29.9* 33.4* 33.3* 33.3* 33.7*  MCV 75.9* 82.5 82.8 83.9 82.6  PLT 163 222 188 170 170   Cardiac Enzymes: No results for input(s): CKTOTAL, CKMB, CKMBINDEX, TROPONINI  in the last 168 hours. CBG: Recent Labs  Lab 11/22/20 1304  GLUCAP 119*    Studies/Results: DG Chest 1 View  Result Date: 11/26/2020 CLINICAL DATA:  Right thoracentesis EXAM: CHEST  1 VIEW COMPARISON:  11/26/2020 FINDINGS: Single frontal view of the chest demonstrates decreased right pleural effusion after recent thoracentesis, with small residual right pleural effusion. Increased aeration at the right lung base, with moderate residual atelectasis or airspace disease. Stable left basilar consolidation and effusion. No evidence of pneumothorax. Stable catheter in the left supraclavicular region, right-sided ventriculostomy catheter,  and right-sided dialysis catheter. Loop recorder within the left anterior chest unchanged. Cardiac silhouette is stable. IMPRESSION: 1. Decreased right pleural effusion after thoracentesis. No evidence of complication. 2. Improving aeration at the right lung base, with moderate residual consolidation and/or atelectasis. 3. Stable left basilar consolidation and effusion. Electronically Signed   By: Randa Ngo M.D.   On: 11/26/2020 15:30   DG CHEST PORT 1 VIEW  Result Date: 11/26/2020 CLINICAL DATA:  Tachypnea EXAM: PORTABLE CHEST 1 VIEW COMPARISON:  November 25, 2020 FINDINGS: Central catheter tip is at the cavoatrial junction, stable. A shunt extends along the right hemithorax. Loop recorder again noted on the left. No pneumothorax. There is moderate partially loculated pleural effusion on the right with smaller pleural effusion on the left. There is airspace opacity in each lower lung region and right mid lung region, stable. Heart is mildly enlarged with pulmonary venous hypertension, stable. No adenopathy. There is aortic atherosclerosis. No bone lesions. IMPRESSION: Stable catheter positions. No pneumothorax. Pleural effusions bilaterally, larger on the right than the left. Areas of airspace opacity in the right mid lung and bibasilar regions, stable. Stable cardiac silhouette. Aortic Atherosclerosis (ICD10-I70.0). Electronically Signed   By: Lowella Grip III M.D.   On: 11/26/2020 08:03   IR THORACENTESIS ASP PLEURAL SPACE W/IMG GUIDE  Result Date: 11/26/2020 INDICATION: Patient with history of COVID-19 pneumonia, ESRD on HD, atrial fibrillation, dyspnea, and bilateral pleural effusions, R>L. Request is made for therapeutic right thoracentesis. EXAM: ULTRASOUND GUIDED THERAPEUTIC RIGHT THORACENTESIS MEDICATIONS: 7 mL 1% lidocaine COMPLICATIONS: None immediate. PROCEDURE: An ultrasound guided thoracentesis was thoroughly discussed with the patient and questions answered. The benefits, risks,  alternatives and complications were also discussed. The patient understands and wishes to proceed with the procedure. Written consent was obtained. Ultrasound was performed to localize and mark an adequate pocket of fluid in the right chest. The area was then prepped and draped in the normal sterile fashion. 1% Lidocaine was used for local anesthesia. Under ultrasound guidance a 6 Fr Safe-T-Centesis catheter was introduced. Thoracentesis was performed. The catheter was removed and a dressing applied. FINDINGS: A total of approximately 1 L of hazy gold fluid was removed. Procedure was stopped after 1 L per patient request due to patient's symptom (chest pain). IMPRESSION: Successful ultrasound guided right thoracentesis yielding 1 L of pleural fluid. Read by: Earley Abide, PA-C Electronically Signed   By: Corrie Mckusick D.O.   On: 11/26/2020 15:26   Medications:  . amiodarone  200 mg Oral Daily  . calcitRIOL  1 mcg Oral Q T,Th,Sa-HD  . Chlorhexidine Gluconate Cloth  6 each Topical Q0600  . feeding supplement (NEPRO CARB STEADY)  237 mL Oral TID BM  . levETIRAcetam  500 mg Oral BID  . methimazole  10 mg Oral BID  . metoprolol tartrate  25 mg Oral BID  . midodrine      . midodrine  10 mg Oral BID WC  .  multivitamin  1 tablet Oral QHS  . pantoprazole  40 mg Oral BID  . polyethylene glycol  17 g Oral BID  . rosuvastatin  10 mg Oral Daily  . sodium zirconium cyclosilicate  10 g Oral Daily

## 2020-11-27 NOTE — TOC Transition Note (Addendum)
Transition of Care [facility]) - CM/SW Discharge Note   Patient Details  Name: [name] MRN: [id number] Date of Birth: 1961/02/08  [facility]) CM/SW Contact:  Zenon Mayo, RN Phone Number: 11/27/2020, 9:04 AM   Clinical Narrative:    Patient is for dc today, NCM notified [facility].  NCM notified MD to put orders in for [place] and HHPT.  NCM spoke with daughter, she states she would like a rolling walker and a BSC.  NCM made referral to [facility] with [employer], this will be brought to patient room prior to dc. Daughter will transport patient home.   Daughter would also like outpatient palliative services. NCM offered choice, she chose [facility]. NCM made referral to [facility].    Final next level of care: [place] Barriers to Discharge: No Barriers Identified   Patient Goals and CMS Choice Patient states their goals for this hospitalization and ongoing recovery are:: home with [facility]      Discharge Placement                       Discharge Plan and Services                  DME Agency: NA       HH Arranged: RN,PT,Nurse's Aide HH Agency: [facility] Date [place]: 11/19/20 Time HH Agency Contacted: 1000 Representative spoke with at [place]: [place] ([place]) Interventions     Readmission Risk Interventions No flowsheet data found.

## 2020-11-28 ENCOUNTER — Emergency Department (HOSPITAL_COMMUNITY): Payer: 59

## 2020-11-28 ENCOUNTER — Telehealth: Payer: Self-pay | Admitting: *Deleted

## 2020-11-28 ENCOUNTER — Telehealth: Payer: Self-pay | Admitting: Nephrology

## 2020-11-28 ENCOUNTER — Inpatient Hospital Stay (HOSPITAL_COMMUNITY)
Admission: EM | Admit: 2020-11-28 | Discharge: 2020-11-30 | DRG: 186 | Disposition: A | Discharge status: Skilled Nursing Facility | Payer: 59 | Attending: Internal Medicine | Admitting: Internal Medicine

## 2020-11-28 ENCOUNTER — Other Ambulatory Visit: Payer: Self-pay

## 2020-11-28 ENCOUNTER — Encounter (HOSPITAL_COMMUNITY): Payer: Self-pay | Admitting: Emergency Medicine

## 2020-11-28 DIAGNOSIS — I671 Cerebral aneurysm, nonruptured: Secondary | ICD-10-CM | POA: Diagnosis present

## 2020-11-28 DIAGNOSIS — N189 Chronic kidney disease, unspecified: Secondary | ICD-10-CM | POA: Diagnosis present

## 2020-11-28 DIAGNOSIS — I2510 Atherosclerotic heart disease of native coronary artery without angina pectoris: Secondary | ICD-10-CM | POA: Diagnosis present

## 2020-11-28 DIAGNOSIS — D631 Anemia in chronic kidney disease: Secondary | ICD-10-CM | POA: Diagnosis present

## 2020-11-28 DIAGNOSIS — U071 COVID-19: Secondary | ICD-10-CM | POA: Diagnosis present

## 2020-11-28 DIAGNOSIS — R627 Adult failure to thrive: Secondary | ICD-10-CM | POA: Diagnosis present

## 2020-11-28 DIAGNOSIS — L8990 Pressure ulcer of unspecified site, unspecified stage: Secondary | ICD-10-CM | POA: Insufficient documentation

## 2020-11-28 DIAGNOSIS — Z992 Dependence on renal dialysis: Secondary | ICD-10-CM

## 2020-11-28 DIAGNOSIS — J90 Pleural effusion, not elsewhere classified: Secondary | ICD-10-CM | POA: Diagnosis present

## 2020-11-28 DIAGNOSIS — Z751 Person awaiting admission to adequate facility elsewhere: Secondary | ICD-10-CM

## 2020-11-28 DIAGNOSIS — E876 Hypokalemia: Secondary | ICD-10-CM | POA: Diagnosis present

## 2020-11-28 DIAGNOSIS — L89152 Pressure ulcer of sacral region, stage 2: Secondary | ICD-10-CM | POA: Diagnosis present

## 2020-11-28 DIAGNOSIS — Z79899 Other long term (current) drug therapy: Secondary | ICD-10-CM

## 2020-11-28 DIAGNOSIS — Z982 Presence of cerebrospinal fluid drainage device: Secondary | ICD-10-CM | POA: Diagnosis not present

## 2020-11-28 DIAGNOSIS — E039 Hypothyroidism, unspecified: Secondary | ICD-10-CM | POA: Diagnosis present

## 2020-11-28 DIAGNOSIS — E7800 Pure hypercholesterolemia, unspecified: Secondary | ICD-10-CM | POA: Diagnosis present

## 2020-11-28 DIAGNOSIS — E0590 Thyrotoxicosis, unspecified without thyrotoxic crisis or storm: Secondary | ICD-10-CM | POA: Diagnosis present

## 2020-11-28 DIAGNOSIS — J1282 Pneumonia due to coronavirus disease 2019: Secondary | ICD-10-CM | POA: Diagnosis present

## 2020-11-28 DIAGNOSIS — I132 Hypertensive heart and chronic kidney disease with heart failure and with stage 5 chronic kidney disease, or end stage renal disease: Secondary | ICD-10-CM | POA: Diagnosis present

## 2020-11-28 DIAGNOSIS — E875 Hyperkalemia: Secondary | ICD-10-CM | POA: Diagnosis present

## 2020-11-28 DIAGNOSIS — J9811 Atelectasis: Secondary | ICD-10-CM | POA: Diagnosis present

## 2020-11-28 DIAGNOSIS — G40909 Epilepsy, unspecified, not intractable, without status epilepticus: Secondary | ICD-10-CM | POA: Diagnosis present

## 2020-11-28 DIAGNOSIS — Z8673 Personal history of transient ischemic attack (TIA), and cerebral infarction without residual deficits: Secondary | ICD-10-CM

## 2020-11-28 DIAGNOSIS — I252 Old myocardial infarction: Secondary | ICD-10-CM

## 2020-11-28 DIAGNOSIS — Z8249 Family history of ischemic heart disease and other diseases of the circulatory system: Secondary | ICD-10-CM | POA: Diagnosis not present

## 2020-11-28 DIAGNOSIS — I5033 Acute on chronic diastolic (congestive) heart failure: Secondary | ICD-10-CM | POA: Diagnosis present

## 2020-11-28 DIAGNOSIS — Z87820 Personal history of traumatic brain injury: Secondary | ICD-10-CM | POA: Diagnosis not present

## 2020-11-28 DIAGNOSIS — Z87891 Personal history of nicotine dependence: Secondary | ICD-10-CM

## 2020-11-28 DIAGNOSIS — N186 End stage renal disease: Secondary | ICD-10-CM | POA: Diagnosis present

## 2020-11-28 DIAGNOSIS — I480 Paroxysmal atrial fibrillation: Secondary | ICD-10-CM | POA: Diagnosis present

## 2020-11-28 DIAGNOSIS — R4182 Altered mental status, unspecified: Secondary | ICD-10-CM | POA: Diagnosis present

## 2020-11-28 DIAGNOSIS — R0902 Hypoxemia: Secondary | ICD-10-CM | POA: Diagnosis present

## 2020-11-28 DIAGNOSIS — Z880 Allergy status to penicillin: Secondary | ICD-10-CM

## 2020-11-28 DIAGNOSIS — Z7982 Long term (current) use of aspirin: Secondary | ICD-10-CM

## 2020-11-28 LAB — DIFFERENTIAL
Abs Immature Granulocytes: 0.07 10*3/uL (ref 0.00–0.07)
Basophils Absolute: 0 10*3/uL (ref 0.0–0.1)
Basophils Relative: 0 %
Eosinophils Absolute: 0 10*3/uL (ref 0.0–0.5)
Eosinophils Relative: 0 %
Immature Granulocytes: 1 %
Lymphocytes Relative: 8 %
Lymphs Abs: 1.1 10*3/uL (ref 0.7–4.0)
Monocytes Absolute: 0.5 10*3/uL (ref 0.1–1.0)
Monocytes Relative: 4 %
Neutro Abs: 11.2 10*3/uL — ABNORMAL HIGH (ref 1.7–7.7)
Neutrophils Relative %: 87 %

## 2020-11-28 LAB — COMPREHENSIVE METABOLIC PANEL
ALT: 16 U/L (ref 0–44)
AST: 24 U/L (ref 15–41)
Albumin: 2.1 g/dL — ABNORMAL LOW (ref 3.5–5.0)
Alkaline Phosphatase: 56 U/L (ref 38–126)
Anion gap: 16 — ABNORMAL HIGH (ref 5–15)
BUN: 29 mg/dL — ABNORMAL HIGH (ref 6–20)
CO2: 24 mmol/L (ref 22–32)
Calcium: 9.3 mg/dL (ref 8.9–10.3)
Chloride: 98 mmol/L (ref 98–111)
Creatinine, Ser: 5.71 mg/dL — ABNORMAL HIGH (ref 0.44–1.00)
GFR, Estimated: 8 mL/min — ABNORMAL LOW (ref >60)
Glucose, Bld: 115 mg/dL — ABNORMAL HIGH (ref 70–99)
Potassium: 3.9 mmol/L (ref 3.5–5.1)
Sodium: 138 mmol/L (ref 135–145)
Total Bilirubin: 1.1 mg/dL (ref 0.3–1.2)
Total Protein: 5.1 g/dL — ABNORMAL LOW (ref 6.5–8.1)

## 2020-11-28 LAB — CBC
HCT: 35.6 % — ABNORMAL LOW (ref 36.0–46.0)
Hemoglobin: 12.1 g/dL (ref 12.0–15.0)
MCH: 28.4 pg (ref 26.0–34.0)
MCHC: 34 g/dL (ref 30.0–36.0)
MCV: 83.6 fL (ref 80.0–100.0)
Platelets: 204 10*3/uL (ref 150–400)
RBC: 4.26 MIL/uL (ref 3.87–5.11)
RDW: 22.6 % — ABNORMAL HIGH (ref 11.5–15.5)
WBC: 12.8 10*3/uL — ABNORMAL HIGH (ref 4.0–10.5)
nRBC: 0.9 % — ABNORMAL HIGH (ref 0.0–0.2)

## 2020-11-28 LAB — I-STAT CHEM 8, ED
BUN: 44 mg/dL — ABNORMAL HIGH (ref 6–20)
Calcium, Ion: 1.05 mmol/L — ABNORMAL LOW (ref 1.15–1.40)
Chloride: 97 mmol/L — ABNORMAL LOW (ref 98–111)
Creatinine, Ser: 5.8 mg/dL — ABNORMAL HIGH (ref 0.44–1.00)
Glucose, Bld: 108 mg/dL — ABNORMAL HIGH (ref 70–99)
HCT: 40 % (ref 36.0–46.0)
Hemoglobin: 13.6 g/dL (ref 12.0–15.0)
Potassium: 5.2 mmol/L — ABNORMAL HIGH (ref 3.5–5.1)
Sodium: 134 mmol/L — ABNORMAL LOW (ref 135–145)
TCO2: 31 mmol/L (ref 22–32)

## 2020-11-28 LAB — PROTIME-INR
INR: 1.2 (ref 0.8–1.2)
Prothrombin Time: 14.7 seconds (ref 11.4–15.2)

## 2020-11-28 LAB — APTT: aPTT: 30 seconds (ref 24–36)

## 2020-11-28 LAB — CBG MONITORING, ED: Glucose-Capillary: 84 mg/dL (ref 70–99)

## 2020-11-28 LAB — I-STAT BETA HCG BLOOD, ED (MC, WL, AP ONLY): I-stat hCG, quantitative: <5 m[IU]/mL (ref <5)

## 2020-11-28 MED ORDER — SODIUM CHLORIDE 0.9% FLUSH
3.0000 mL | Freq: Once | INTRAVENOUS | Status: DC
Start: 2020-11-28 — End: 2020-11-30

## 2020-11-28 NOTE — ED Notes (Addendum)
This RN clean and change pt and place purwick on pt

## 2020-11-28 NOTE — ED Provider Notes (Signed)
[facility] EMERGENCY DEPARTMENT Provider Note   CSN: [id number] Arrival date & time: 11/28/20  [id number]     History Chief Complaint  Patient presents with  . Altered Mental Status    [name] is a 60 y.o. female.  Recent discharge for Covid pneumonia, anemia, respiratory distress, pleural effusion, hyperkalemia, dialysis, A. fib, left wrist fracture, malnutrition, encephalopathy, bilateral subdural fluid collection, hypothyroidism.  Per review of PT and case management notes, patient was recommended to go to SNF however patient and daughter insisted on going home.  History, limited due to baseline mental status.  Patient answers basic questions but does not elaborate on any detail regarding her recent medical care. Patient denies any acute complaints, states she is willing to be admitted to nursing facility.  HPI     Past Medical History:  Diagnosis Date  . Anterior cerebral artery aneurysm    1996 AT [facility]  . CAD (coronary artery disease)    a. 2009 for NSTEMI with 70% dLAD, 30% mCx, 50% dCx, 95% dRCA, 40% PLA -> RCA was treated with DES.  . Diastolic heart failure    LVEF 60-65% with grade 2 diastolic dysfunction - July 2014  . Encephalopathy   . ESRD (end stage renal disease) on dialysis ([place])   . HTN (hypertension)    Resistant  . Hypercholesterolemia   . PAF (paroxysmal atrial fibrillation) ([place]) 02/03/2019  . Poor historian   . Seizures ([place])   . 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 Subarachnoid hemorrhage ([place])    03/2012  . Tobacco abuse    Resumed smoking half a pack a day. She was a smoker in the past and states she was abl eto stop in the past using nicotine patches    Patient Active Problem List   Diagnosis Date Noted  . Subdural fluid collection 11/27/2020  . Left wrist fracture 11/27/2020  . Leg DVT (deep venous thromboembolism), acute, bilateral ([place]) 11/27/2020  . Hyperthyroidism 11/27/2020  . Malnutrition of moderate degree 11/20/2020   . Epigastric abdominal pain 11/08/2020  . NSTEMI (non-ST elevated myocardial infarction) ([place]) 10/16/2020  . AMS (altered mental status) 10/06/2020  . Elevated troponin 10/06/2020  . Headache, unspecified 11/28/2019  . Hypercalcemia 11/10/2019  . Allergy, unspecified, initial encounter 08/08/2019  . Hypokalemia 07/07/2019  . Sepsis, unspecified organism ([place]) 02/03/2019  . End-stage renal disease on hemodialysis ([place]) 02/03/2019  . Unspecified atrial fibrillation ([place]) 02/03/2019  . Hypotension 02/02/2019  . Syncope and collapse   . Goals of care, counseling/discussion   . Atrial fibrillation with rapid ventricular response ([place]) 03/20/2018  . Chills (without fever) 03/16/2018  . Iron deficiency anemia, unspecified 02/04/2018  . 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 Anemia in chronic kidney disease 12/30/2017  . Fever, unspecified 12/30/2017  . Generalized abdominal pain 12/30/2017  . Heart failure, unspecified ([place]) 12/30/2017  . Other specified personal risk factors, not elsewhere classified 12/30/2017  . Pure hypercholesterolemia, unspecified 12/30/2017  . Secondary hyperparathyroidism of renal origin ([place]) 12/30/2017  . Shortness of breath 12/30/2017  . History of embolic stroke [id number]  . Altered mental status 11/08/2013  . HTN (hypertension) 11/08/2013  . Hypertensive renal disease 11/06/2013  . Protein-calorie malnutrition, severe ([place]) 11/04/2013  . Seizure disorder ([place]) 10/22/2013  . Hypertensive urgency 10/20/2013  . Hypertensive heart disease 05/07/2013  . Acute-on-chronic renal failure ([place]) 05/07/2013  . HTN (hypertension), malignant 05/06/2013  . CAD (coronary artery disease) 05/06/2013  . ESRD (end stage renal disease) ([place]) 05/06/2013  . Chronic  diastolic heart failure ([place])   . Tobacco abuse 09/25/2012  . H/O noncompliance with medical treatment, presenting hazards to health   . Renal artery stenosis ([place]) 04/05/2012  . History of subarachnoid  hemorrhage 03/03/2012  . Mixed hyperlipidemia 09/19/2008  . Acute on chronic diastolic heart failure ([place]) 09/19/2008    Past Surgical History:  Procedure Laterality Date  . ABDOMINAL HYSTERECTOMY    . BASCILIC VEIN TRANSPOSITION Right 05/13/2013   Procedure: [place];  Surgeon: Rosetta Posner, MD;  Location: [facility];  Service: Vascular;  Laterality: Right;  . BRAIN SURGERY     2   . INSERTION OF DIALYSIS CATHETER Right 10/15/2020   Procedure: INSERTION OF TUNNELED DIALYSIS CATHETER;  Surgeon: Cherre Robins, MD;  Location: [facility] OR;  Service: Vascular;  Laterality: Right;  . IR THORACENTESIS ASP PLEURAL SPACE W/IMG GUIDE  11/26/2020  . LOOP RECORDER IMPLANT N/A 11/14/2013   Procedure: LOOP RECORDER IMPLANT;  Surgeon: Evans Lance, MD;  Location: [facility] CATH LAB;  Service: Cardiovascular;  Laterality: N/A;  . OPEN REDUCTION INTERNAL FIXATION (ORIF) DISTAL RADIAL FRACTURE Left 11/15/2020   Procedure: OPEN REDUCTION INTERNAL FIXATION (ORIF) DISTAL RADIAL FRACTURE;  Surgeon: Verner Mould, MD;  Location: [facility];  Service: Orthopedics;  Laterality: Left;  . REVISON OF ARTERIOVENOUS FISTULA Right 10/15/2020   Procedure: REVISON OF ARTERIOVENOUS FISTULA ARM;  Surgeon: Cherre Robins, MD;  Location: [facility];  Service: Vascular;  Laterality: Right;  . TEE WITHOUT CARDIOVERSION N/A 11/14/2013   Procedure: TRANSESOPHAGEAL ECHOCARDIOGRAM (TEE);  Surgeon: Candee Furbish, MD;  Location: [facility] ENDOSCOPY;  Service: Cardiovascular;  Laterality: N/A;  . VENTRICULOPERITONEAL SHUNT  03/27/2012   Procedure: SHUNT INSERTION VENTRICULAR-PERITONEAL;  Surgeon: Winfield Cunas, MD;  Location: [facility] NEURO ORS;  Service: Neurosurgery;  Laterality: N/A;  Ventricular-Peritoneal Shunt Insertion     OB History   No obstetric history on file.     Family History  Problem Relation Age of Onset  . Heart disease Mother   . Heart disease Father   . Coronary artery disease Other        Premature in 1st degree  relatives    Social History   Tobacco Use  . Smoking status: Former Smoker    Packs/day: 0.20    Years: 35.00    Pack years: 7.00    Types: Cigarettes    Quit date: 03/20/2017    Years since quitting: 3.6  . Smokeless tobacco: Never Used  . Tobacco comment: 1/2 ppd   Vaping Use  . Vaping Use: Never used  Substance Use Topics  . Alcohol use: No  . Drug use: No    Home Medications Prior to Admission medications   Medication Sig Start Date End Date Taking? Authorizing Provider  amiodarone (PACERONE) 200 MG tablet Take 1 tablet (200 mg total) by mouth daily. 11/27/20   Mariel Aloe, MD  aspirin 81 MG EC tablet Take 81 mg by mouth every 8 (eight) hours as needed for pain. 12/31/17   [provider]  calcitRIOL (ROCALTROL) 0.5 MCG capsule Take 1 capsule (0.5 mcg total) by mouth Every Tuesday,Thursday,and Saturday with dialysis. Patient not taking: Reported on 11/09/2020 02/05/19   Modena Jansky, MD  calcium acetate (PHOSLO) 667 MG capsule Take 667 mg by mouth 3 (three) times daily. 11/28/19   [provider]  levETIRAcetam (KEPPRA) 500 MG tablet Take 1 tablet (500 mg total) by mouth 2 (two) times daily. 11/27/20   Mariel Aloe, MD  metoprolol tartrate (LOPRESSOR) 25 MG tablet Take 1 tablet (25 mg total) by mouth 2 (two) times daily. 11/27/20   Mariel Aloe, MD  midodrine (PROAMATINE) 10 MG tablet Take 1 tablet (10 mg total) by mouth 2 (two) times daily with a meal. 11/27/20   Mariel Aloe, MD  multivitamin (RENA-VIT) TABS tablet Take 1 tablet by mouth at bedtime. 11/27/20   Mariel Aloe, MD  nitroGLYCERIN (NITROSTAT) 0.4 MG SL tablet Place 1 tablet (0.4 mg total) under the tongue every 5 (five) minutes as needed for chest pain. Patient not taking: Reported on 11/09/2020 05/18/13   Janece Canterbury, MD  Nutritional Supplements (FEEDING SUPPLEMENT, NEPRO CARB STEADY,) LIQD Take 237 mLs by mouth 3 (three) times daily between meals. 11/27/20   Mariel Aloe, MD   pantoprazole (PROTONIX) 40 MG tablet Take 1 tablet (40 mg total) by mouth daily. 11/27/20   Mariel Aloe, MD  polyethylene glycol (MIRALAX / GLYCOLAX) 17 g packet Take 17 g by mouth 2 (two) times daily. 11/27/20   Mariel Aloe, MD  rosuvastatin (CRESTOR) 10 MG tablet Take 10 mg by mouth daily. 10/16/20   [provider]    Allergies    Amoxicillin and Penicillins  Review of Systems   Review of Systems  Unable to perform ROS: Mental status change    Physical Exam Updated Vital Signs BP 134/61   Pulse 73   Temp 97.6 F (36.4 C) (Oral)   Resp 19   SpO2 97%   Physical Exam Vitals and nursing note reviewed.  Constitutional:      Comments: Chronically ill-appearing but in no acute distress  HENT:     Head: Normocephalic.     Nose: Nose normal.     Mouth/Throat:     Mouth: Mucous membranes are moist.  Eyes:     Pupils: Pupils are equal, round, and reactive to light.  Cardiovascular:     Rate and Rhythm: Normal rate.     Pulses: Normal pulses.  Pulmonary:     Comments: Somewhat tachypneic, diminished at bases Abdominal:     General: Abdomen is flat.  Musculoskeletal:        General: No deformity or signs of injury.     Cervical back: Normal range of motion.  Skin:    General: Skin is warm.     Capillary Refill: Capillary refill takes less than 2 seconds.  Neurological:     General: No focal deficit present.     ED Results / Procedures / Treatments   Labs (all labs ordered are listed, but only abnormal results are displayed) Labs Reviewed  CBC - Abnormal; Notable for the following components:      Result Value   WBC 12.8 (*)    HCT 35.6 (*)    RDW 22.6 (*)    nRBC 0.9 (*)    All other components within normal limits  DIFFERENTIAL - Abnormal; Notable for the following components:   Neutro Abs 11.2 (*)    All other components within normal limits  COMPREHENSIVE METABOLIC PANEL - Abnormal; Notable for the following components:   Glucose, Bld 115 (*)     BUN 29 (*)    Creatinine, Ser 5.71 (*)    Total Protein 5.1 (*)    Albumin 2.1 (*)    GFR, Estimated 8 (*)    Anion gap 16 (*)    All other components within normal limits  I-STAT CHEM 8, ED - Abnormal; Notable for the  following components:   Sodium 134 (*)    Potassium 5.2 (*)    Chloride 97 (*)    BUN 44 (*)    Creatinine, Ser 5.80 (*)    Glucose, Bld 108 (*)    Calcium, Ion 1.05 (*)    All other components within normal limits  PROTIME-INR  APTT  CBG MONITORING, ED  I-STAT BETA HCG BLOOD, ED ([facility], [facility], [facility] ONLY)    EKG EKG Interpretation  Date/Time:  Wednesday November 28 2020 19:04:00 EST Ventricular Rate:  77 PR Interval:  180 QRS Duration: 106 QT Interval:  472 QTC Calculation: 534 R Axis:   -15 Text Interpretation: Normal sinus rhythm Possible Left atrial enlargement Minimal voltage criteria for LVH, may be normal variant ( Cornell product ) Anterior infarct , age undetermined ST & T wave abnormality, consider inferolateral ischemia Prolonged QT Abnormal ECG no stemi Confirmed by Madalyn Rob [phone]) on 11/28/2020 9:54:30 PM   Radiology CT HEAD WO CONTRAST  Result Date: 11/28/2020 CLINICAL DATA:  Altered level of consciousness, lethargic EXAM: CT HEAD WITHOUT CONTRAST TECHNIQUE: Contiguous axial images were obtained from the base of the skull through the vertex without intravenous contrast. COMPARISON:  11/19/2020 FINDINGS: Brain: Chronic ischemic changes are seen within the left cerebellar hemisphere, left parietooccipital lobe, and bilateral periventricular white matter. No signs of acute infarct or hemorrhage. Lateral ventricles and midline structures are stable. Right frontal ventriculostomy catheter unchanged. No acute extra-axial fluid collections. No mass effect. 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 No hyperdense vessel. Stable atherosclerosis. Skull: Stable postsurgical changes from right  frontal craniotomy. No acute bony abnormalities. Sinuses/Orbits: No acute finding. Other: None. IMPRESSION: 1. No acute intracranial process. 2. Chronic ischemic changes throughout the white matter, left parietooccipital lobe, and left cerebellar hemisphere, stable. 3. Stable postsurgical changes as above. Electronically Signed   By: Randa Ngo M.D.   On: 11/28/2020 21:14   DG Chest Portable 1 View  Result Date: 11/28/2020 CLINICAL DATA:  COVID infection 3 weeks ago.  Worsening weakness. EXAM: PORTABLE CHEST 1 VIEW COMPARISON:  11/26/2020 FINDINGS: Medium-sized pleural effusions with basilar atelectasis. Mild cardiomegaly. Right IJ approach central venous catheter tip is at the cavoatrial junction. Partially visualized shunt catheter. IMPRESSION: Worsened medium-sized bilateral pleural effusions and basilar atelectasis. Electronically Signed   By: Ulyses Jarred M.D.   On: 11/28/2020 20:37    Procedures Procedures   Medications Ordered in ED Medications  sodium chloride flush (NS) 0.9 % injection 3 mL (has no administration in time range)    ED Course  I have reviewed the triage vital signs and the nursing notes.  Pertinent labs & imaging results that were available during my care of the patient were reviewed by me and considered in my medical decision making (see chart for details).    MDM Rules/Calculators/A&P                         60 year old lady(ESRD on HD TTS, CAD, hypertension, hyperlipidemia, paroxysmal A. fib not on chronic anticoagulation due to history of cerebral aneurysm with subarachnoid hemorrhage in 2013, seizure disorder, embolic CVA in [id number]) presents to the emergency room with concern for failure to thrive.  Discharged from hospital yesterday, initial report was patient was lethargic and altered.  Patient denied acute complaints but did not elaborate on any great detail of her current or recent medical condition.  Based on review of chart, suspect she  may be at baseline.   Completed broad work-up, most notable for stable CT head, blood work grossly stable except for mild hyperkalemia.  CXR with worsening bilateral pleural effusions.  Recent thoracentesis.  Given the worsening effusions, dialysis patient, believe patient would benefit from being readmitted to the hospitalist service for further management.  I have contacted case management, patient is agreeable for SNF placement once discharged.  Final Clinical Impression(s) / ED Diagnoses Final diagnoses:  Hyperkalemia  ESRD (end stage renal disease) ([place])  COVID-19  Pleural effusion    Rx / DC Orders ED Discharge Orders    None       Lucrezia Starch, MD 11/28/20 2230

## 2020-11-28 NOTE — Telephone Encounter (Signed)
Transition of Care Contact from [place]   Date of Discharge: 11/27/20 Date of Contact: 11/28/20 Method of contact: phone Talked to patient daughter   Patient contacted to discuss transition of care form recent hospitaliztion. Patient was admitted to [facility] from 11/08/20 to 11/27/20 with the discharge diagnosis of COVID PNA, acute respiratory distress, hyperkalemia, constipation and A fib.     Daughter reports her mother is requiring more care than she can provide.  States she is having to carry her up and down the stairs to complete ADLs.  She discussed with other providers and plans to bring patient back to the hospital to be placed in SNF until she can regain strength and assist with ADLs.    Jen Mow, PA-C [facility] Pager: [phone]

## 2020-11-28 NOTE — ED Triage Notes (Signed)
Patient discharged from hospital yesterday, daughter states patient was lethargic and altered starting last night. Daughter states patient is usually able to perform ADL's independently but as of last night performs none.

## 2020-11-28 NOTE — Telephone Encounter (Signed)
Interim RN (Amy) called to inform of that pt daughter having difficultly caring for pt in the home.  States pt unable to assist/participate in care and it is more than daughter can handle with home health services.  Amy advised daughter to call MD office.

## 2020-11-28 NOTE — ED Notes (Signed)
Patient transported to CT 

## 2020-11-28 NOTE — H&P (Signed)
History and Physical   [name] [id number] DOB: 1961/06/05 DOA: 11/28/2020  Referring MD/NP/PA: Dr. Roslynn Amble  PCP: Clovia Cuff, MD   Outpatient Specialists: [facility]  Patient coming from: Home  Chief Complaint: Shortness of breath and weakness  HPI: [name] is a 60 y.o. female with medical history significant of end-stage renal disease on hemodialysis, coronary artery disease, diastolic heart failure, hypertension, anemia of chronic disease who was just discharged yesterday after 2 weeks admission with COVID-19 pneumonia and failing to thrive. Home health was set up and patient was discharged home. She came back today because she has failed to function at home. Patient also has significant weakness and worsening mental status per family. She was supposed to be cared for by the family but that does not seem to be happening. Patient was found to have bilateral pleural effusion. She additionally appears to have hypokalemia and baseline confusion. Patient being readmitted with failure to thrive recurrent pleural effusion on altered mental status. She might need to also have placement in a skilled facility..  ED Course: Temperature 96 blood pressure 134/58 pulse 76 respiratory 25 oxygen sat 97% on room air. White count 12.8 sodium 134 potassium 5.2 chloride 97 CO2 24 BUN 44 creatinine 5.0 glucose 108. Chest x-ray showed worsening medium-sized bilateral pleural effusions and basilar atelectasis. Head CT without contrast was negative. Patient readmitted for evaluation and possible placement  Review of Systems: As per HPI otherwise 10 point review of systems negative.    Past Medical History:  Diagnosis Date  . Anterior cerebral artery aneurysm    1996 AT [facility]  . CAD (coronary artery disease)    a. 2009 for NSTEMI with 70% dLAD, 30% mCx, 50% dCx, 95% dRCA, 40% PLA -> RCA was treated with DES.  . Diastolic heart failure    LVEF 60-65% with grade 2 diastolic  dysfunction - July 2014  . Encephalopathy   . ESRD (end stage renal disease) on dialysis ([place])   . HTN (hypertension)    Resistant  . Hypercholesterolemia   . PAF (paroxysmal atrial fibrillation) ([place]) 02/03/2019  . Poor historian   . Seizures ([place])   . 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 Subarachnoid hemorrhage ([place])    03/2012  . Tobacco abuse    Resumed smoking half a pack a day. She was a smoker in the past and states she was abl eto stop in the past using nicotine patches    Past Surgical History:  Procedure Laterality Date  . ABDOMINAL HYSTERECTOMY    . BASCILIC VEIN TRANSPOSITION Right 05/13/2013   Procedure: [place];  Surgeon: Rosetta Posner, MD;  Location: [place];  Service: Vascular;  Laterality: Right;  . BRAIN SURGERY     2   . INSERTION OF DIALYSIS CATHETER Right 10/15/2020   Procedure: INSERTION OF TUNNELED DIALYSIS CATHETER;  Surgeon: Cherre Robins, MD;  Location: [facility] OR;  Service: Vascular;  Laterality: Right;  . IR THORACENTESIS ASP PLEURAL SPACE W/IMG GUIDE  11/26/2020  . LOOP RECORDER IMPLANT N/A 11/14/2013   Procedure: LOOP RECORDER IMPLANT;  Surgeon: Evans Lance, MD;  Location: [facility] CATH LAB;  Service: Cardiovascular;  Laterality: N/A;  . OPEN REDUCTION INTERNAL FIXATION (ORIF) DISTAL RADIAL FRACTURE Left 11/15/2020   Procedure: OPEN REDUCTION INTERNAL FIXATION (ORIF) DISTAL RADIAL FRACTURE;  Surgeon: Verner Mould, MD;  Location: [place];  Service: Orthopedics;  Laterality: Left;  . REVISON OF ARTERIOVENOUS FISTULA Right 10/15/2020   Procedure: REVISON OF ARTERIOVENOUS  FISTULA ARM;  Surgeon: Cherre Robins, MD;  Location: [facility] OR;  Service: Vascular;  Laterality: Right;  . TEE WITHOUT CARDIOVERSION N/A 11/14/2013   Procedure: TRANSESOPHAGEAL ECHOCARDIOGRAM (TEE);  Surgeon: Candee Furbish, MD;  Location: [facility] ENDOSCOPY;  Service: Cardiovascular;  Laterality: N/A;  . VENTRICULOPERITONEAL SHUNT  03/27/2012   Procedure: SHUNT INSERTION  VENTRICULAR-PERITONEAL;  Surgeon: Winfield Cunas, MD;  Location: [place] NEURO ORS;  Service: Neurosurgery;  Laterality: N/A;  Ventricular-Peritoneal Shunt Insertion     reports that she quit smoking about 3 years ago. Her smoking use included cigarettes. She has a 7.00 pack-year smoking history. She has never used smokeless tobacco. She reports that she does not drink alcohol and does not use drugs.  Allergies  Allergen Reactions  . Amoxicillin Hives and Rash    Informed by pt's daughter [name]   . Penicillins Hives and Rash    Has patient had a PCN reaction causing immediate rash, facial/tongue/throat swelling, SOB or lightheadedness with hypotension: Yes Has patient had a PCN reaction causing severe rash involving mucus membranes or skin necrosis: No Has patient had a PCN reaction that required hospitalization: No Has patient had a PCN reaction occurring within the last 10 years: No If all of the above answers are "NO", then may proceed with Cephalosporin use. PATIENT TOLERATED CEFAZOLIN IN OR 11/15/20     Family History  Problem Relation Age of Onset  . Heart disease Mother   . Heart disease Father   . Coronary artery disease Other        Premature in 1st degree relatives     Prior to Admission medications   Medication Sig Start Date End Date Taking? Authorizing Provider  amiodarone (PACERONE) 200 MG tablet Take 1 tablet (200 mg total) by mouth daily. 11/27/20   Mariel Aloe, MD  aspirin 81 MG EC tablet Take 81 mg by mouth every 8 (eight) hours as needed for pain. 12/31/17   [provider]  calcitRIOL (ROCALTROL) 0.5 MCG capsule Take 1 capsule (0.5 mcg total) by mouth Every Tuesday,Thursday,and Saturday with dialysis. Patient not taking: Reported on 11/09/2020 02/05/19   Modena Jansky, MD  calcium acetate (PHOSLO) 667 MG capsule Take 667 mg by mouth 3 (three) times daily. 11/28/19   [provider]  levETIRAcetam (KEPPRA) 500 MG tablet Take 1 tablet (500  mg total) by mouth 2 (two) times daily. 11/27/20   Mariel Aloe, MD  metoprolol tartrate (LOPRESSOR) 25 MG tablet Take 1 tablet (25 mg total) by mouth 2 (two) times daily. 11/27/20   Mariel Aloe, MD  midodrine (PROAMATINE) 10 MG tablet Take 1 tablet (10 mg total) by mouth 2 (two) times daily with a meal. 11/27/20   Mariel Aloe, MD  multivitamin (RENA-VIT) TABS tablet Take 1 tablet by mouth at bedtime. 11/27/20   Mariel Aloe, MD  nitroGLYCERIN (NITROSTAT) 0.4 MG SL tablet Place 1 tablet (0.4 mg total) under the tongue every 5 (five) minutes as needed for chest pain. Patient not taking: Reported on 11/09/2020 05/18/13   Janece Canterbury, MD  Nutritional Supplements (FEEDING SUPPLEMENT, NEPRO CARB STEADY,) LIQD Take 237 mLs by mouth 3 (three) times daily between meals. 11/27/20   Mariel Aloe, MD  pantoprazole (PROTONIX) 40 MG tablet Take 1 tablet (40 mg total) by mouth daily. 11/27/20   Mariel Aloe, MD  polyethylene glycol (MIRALAX / GLYCOLAX) 17 g packet Take 17 g by mouth 2 (two) times daily. 11/27/20   Cordelia Poche  A, MD  rosuvastatin (CRESTOR) 10 MG tablet Take 10 mg by mouth daily. 10/16/20   [provider]    Physical Exam: Vitals:   11/28/20 1910 11/28/20 2025 11/28/20 2200 11/28/20 2300  BP: 122/79 134/61 (!) 119/59 (!) 135/58  Pulse: 76 73 71 69  Resp: (!) 22 19 (!) 25 20  Temp: 98.6 F (37 C) 97.6 F (36.4 C)    TempSrc: Axillary Oral    SpO2: 97% 97% 98% 97%      Constitutional: Chronically ill looking, cachectic no distress Vitals:   11/28/20 1910 11/28/20 2025 11/28/20 2200 11/28/20 2300  BP: 122/79 134/61 (!) 119/59 (!) 135/58  Pulse: 76 73 71 69  Resp: (!) 22 19 (!) 25 20  Temp: 98.6 F (37 C) 97.6 F (36.4 C)    TempSrc: Axillary Oral    SpO2: 97% 97% 98% 97%   Eyes: PERRL, lids and conjunctivae normal ENMT: Mucous membranes are moist. Posterior pharynx clear of any exudate or lesions.Normal dentition.  Neck: normal, supple, no masses, no  thyromegaly Respiratory: Coarse breath sound bilaterally with some basal crackles normal respiratory effort. No accessory muscle use.  Cardiovascular: Regular rate and rhythm, no murmurs / rubs / gallops. No extremity edema. 2+ pedal pulses. No carotid bruits.  Abdomen: no tenderness, no masses palpated. No hepatosplenomegaly. Bowel sounds positive.  Musculoskeletal: no clubbing / cyanosis. No joint deformity upper and lower extremities. Good ROM, no contractures. Normal muscle tone.  Skin: no rashes, lesions, ulcers. No induration Neurologic: CN 2-12 grossly intact. Sensation intact, DTR normal. Strength 5/5 in all 4.  Psychiatric: Normal judgment and insight. Alert and oriented x 3. Depressed mood.     Labs on Admission: I have personally reviewed following labs and imaging studies  CBC: Recent Labs  Lab 11/23/20 2013 11/24/20 0747 11/25/20 0433 11/27/20 0456 11/28/20 1932 11/28/20 2027  WBC 13.2* 10.1 10.4 13.8* 12.8*  --   NEUTROABS  --   --   --   --  11.2*  --   HGB 11.2* 10.9* 11.3* 11.7* 12.1 13.6  HCT 33.4* 33.3* 33.3* 33.7* 35.6* 40.0  MCV 82.5 82.8 83.9 82.6 83.6  --   PLT 222 188 170 170 204  --    Basic Metabolic Panel: Recent Labs  Lab 11/24/20 0600 11/24/20 0747 11/25/20 0433 11/27/20 0456 11/28/20 1932 11/28/20 2027  NA 138 139 137 136 138 134*  K 6.0* 5.0 5.9* 4.4 3.9 5.2*  CL 97* 97* 97* 94* 98 97*  CO2 24 24 20* 25 24  --   GLUCOSE 102* 93 96 85 115* 108*  BUN 27* 25* 33* 35* 29* 44*  CREATININE 4.79* 4.85* 5.89* 6.21* 5.71* 5.80*  CALCIUM 9.2 9.1 9.6 9.3 9.3  --   PHOS 5.3* 5.3* 7.0* 7.0*  --   --    GFR: Estimated Creatinine Clearance: 6.7 mL/min (A) (by C-G formula based on SCr of 5.8 mg/dL (H)). Liver Function Tests: Recent Labs  Lab 11/24/20 0600 11/24/20 0747 11/25/20 0433 11/27/20 0456 11/28/20 1932  AST  --   --   --   --  24  ALT  --   --   --   --  16  ALKPHOS  --   --   --   --  56  BILITOT  --   --   --   --  1.1  PROT  --    --   --   --  5.1*  ALBUMIN 2.1*  2.2* 2.3* 2.1* 2.1*   No results for input(s): LIPASE, AMYLASE in the last 168 hours. No results for input(s): AMMONIA in the last 168 hours. Coagulation Profile: Recent Labs  Lab 11/28/20 1932  INR 1.2   Cardiac Enzymes: No results for input(s): CKTOTAL, CKMB, CKMBINDEX, TROPONINI in the last 168 hours. BNP (last 3 results) No results for input(s): PROBNP in the last 8760 hours. HbA1C: No results for input(s): HGBA1C in the last 72 hours. CBG: Recent Labs  Lab 11/22/20 1304 11/28/20 2024  GLUCAP 119* 84   Lipid Profile: No results for input(s): CHOL, HDL, LDLCALC, TRIG, CHOLHDL, LDLDIRECT in the last 72 hours. Thyroid Function Tests: No results for input(s): TSH, T4TOTAL, FREET4, T3FREE, THYROIDAB in the last 72 hours. Anemia Panel: No results for input(s): VITAMINB12, FOLATE, FERRITIN, TIBC, IRON, RETICCTPCT in the last 72 hours. Urine analysis:    Component Value Date/Time   COLORURINE YELLOW 11/08/2013 [place] 11/08/2013 1304   LABSPEC 1.009 11/08/2013 1304   PHURINE 7.5 11/08/2013 1304   GLUCOSEU NEGATIVE 11/08/2013 1304   HGBUR NEGATIVE 11/08/2013 1304   BILIRUBINUR NEGATIVE 11/08/2013 1304   KETONESUR NEGATIVE 11/08/2013 1304   PROTEINUR NEGATIVE 11/08/2013 1304   UROBILINOGEN 0.2 11/08/2013 1304   NITRITE NEGATIVE 11/08/2013 1304   LEUKOCYTESUR NEGATIVE 11/08/2013 1304   Sepsis Labs: '@LABRCNTIP'$ (procalcitonin:4,lacticidven:4) )No results found for this or any previous visit (from the past 240 hour(s)).   Radiological Exams on Admission: CT HEAD WO CONTRAST  Result Date: 11/28/2020 CLINICAL DATA:  Altered level of consciousness, lethargic EXAM: CT HEAD WITHOUT CONTRAST TECHNIQUE: Contiguous axial images were obtained from the base of the skull through the vertex without intravenous contrast. COMPARISON:  11/19/2020 FINDINGS: Brain: Chronic ischemic changes are seen within the left cerebellar hemisphere, left  parietooccipital lobe, and bilateral periventricular white matter. No signs of acute infarct or hemorrhage. Lateral ventricles and midline structures are stable. Right frontal ventriculostomy catheter unchanged. No acute extra-axial fluid collections. No mass effect. 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 No hyperdense vessel. Stable atherosclerosis. Skull: Stable postsurgical changes from right frontal craniotomy. No acute bony abnormalities. Sinuses/Orbits: No acute finding. Other: None. IMPRESSION: 1. No acute intracranial process. 2. Chronic ischemic changes throughout the white matter, left parietooccipital lobe, and left cerebellar hemisphere, stable. 3. Stable postsurgical changes as above. Electronically Signed   By: Randa Ngo M.D.   On: 11/28/2020 21:14   DG Chest Portable 1 View  Result Date: 11/28/2020 CLINICAL DATA:  COVID infection 3 weeks ago.  Worsening weakness. EXAM: PORTABLE CHEST 1 VIEW COMPARISON:  11/26/2020 FINDINGS: Medium-sized pleural effusions with basilar atelectasis. Mild cardiomegaly. Right IJ approach central venous catheter tip is at the cavoatrial junction. Partially visualized shunt catheter. IMPRESSION: Worsened medium-sized bilateral pleural effusions and basilar atelectasis. Electronically Signed   By: Ulyses Jarred M.D.   On: 11/28/2020 20:37    EKG: Independently reviewed. Sinus rhythm no significant findings  Assessment/Plan Principal Problem:   Pleural effusion Active Problems:   Acute on chronic diastolic heart failure (HCC)   CAD (coronary artery disease)   ESRD (end stage renal disease) (HCC)   Seizure disorder (HCC)   Altered mental status   Anemia in chronic kidney disease     #1 bilateral pleural effusions: Recurrent. May be related to her end-stage renal disease. Patient being admitted and appears to have improved so far. She was initially requiring oxygen but not anymore.  She will need to  be hemodialyzed hopefully tomorrow again.  #2 hypokalemia: Most likely secondary to hemodialysis required end-stage renal disease. Continue treatment.  #3 altered mental status: Again possibly secondary to hypoxia. Continue treatment  #4 generalized weakness with failure to thrive: Patient is not safe to be home living with a home health. Most likely will require placement to skilled facility if possible. Reestablish PT OT evaluation.  #5 seizure disorder: We will continue antiseizure medications..  #6 COVID-19 infection: Asymptomatic at the moment. Still within window for infectiousness day 15. Supportive care only   DVT prophylaxis: Heparin Code Status: Full code Family Communication: No family at bedside Disposition Plan: Possibly skilled facility Consults called: None but nephrology consult for dialysis Admission status: Inpatient  Severity of Illness: The appropriate patient status for this patient is INPATIENT. Inpatient status is judged to be reasonable and necessary in order to provide the required intensity of service to ensure the patient's safety. The patient's presenting symptoms, physical exam findings, and initial radiographic and laboratory data in the context of their chronic comorbidities is felt to place them at high risk for further clinical deterioration. Furthermore, it is not anticipated that the patient will be medically stable for discharge from the hospital within 2 midnights of admission. The following factors support the patient status of inpatient.   " The patient's presenting symptoms include generalized weakness and altered mental status. " The worrisome physical exam findings include chronically ill looking no distress. " The initial radiographic and laboratory data are worrisome because of bilateral pleural effusions. " The chronic co-morbidities include end-stage renal disease.   * I certify that at the point of admission it is my  clinical judgment that the patient will require inpatient hospital care spanning beyond 2 midnights from the point of admission due to high intensity of service, high risk for further deterioration and high frequency of surveillance required.Barbette Merino MD [facility] Pager [phone]  If 7PM-7AM, please contact night-coverage [email or web address] Password [facility]  11/28/2020, 11:35 PM

## 2020-11-28 NOTE — Telephone Encounter (Signed)
Amy called to inform that pt MD advised pt to return to hospital.

## 2020-11-28 NOTE — Care Management (Signed)
TOC team was contacted by [facility] office that patient would be returning due to daughter apparently wanted to take patient home yesterday despite PT recommendations tor SNF Placement.

## 2020-11-29 DIAGNOSIS — U071 COVID-19: Secondary | ICD-10-CM

## 2020-11-29 DIAGNOSIS — G40909 Epilepsy, unspecified, not intractable, without status epilepticus: Secondary | ICD-10-CM

## 2020-11-29 DIAGNOSIS — N189 Chronic kidney disease, unspecified: Secondary | ICD-10-CM

## 2020-11-29 DIAGNOSIS — D631 Anemia in chronic kidney disease: Secondary | ICD-10-CM

## 2020-11-29 DIAGNOSIS — L8990 Pressure ulcer of unspecified site, unspecified stage: Secondary | ICD-10-CM | POA: Insufficient documentation

## 2020-11-29 DIAGNOSIS — N186 End stage renal disease: Secondary | ICD-10-CM

## 2020-11-29 LAB — CBC
HCT: 42.2 % (ref 36.0–46.0)
Hemoglobin: 13.6 g/dL (ref 12.0–15.0)
MCH: 27.8 pg (ref 26.0–34.0)
MCHC: 32.2 g/dL (ref 30.0–36.0)
MCV: 86.3 fL (ref 80.0–100.0)
Platelets: 220 10*3/uL (ref 150–400)
RBC: 4.89 MIL/uL (ref 3.87–5.11)
RDW: 23.5 % — ABNORMAL HIGH (ref 11.5–15.5)
WBC: 9.6 10*3/uL (ref 4.0–10.5)
nRBC: 0.8 % — ABNORMAL HIGH (ref 0.0–0.2)

## 2020-11-29 LAB — RENAL FUNCTION PANEL
Albumin: 2.4 g/dL — ABNORMAL LOW (ref 3.5–5.0)
Anion gap: 18 — ABNORMAL HIGH (ref 5–15)
BUN: 32 mg/dL — ABNORMAL HIGH (ref 6–20)
CO2: 25 mmol/L (ref 22–32)
Calcium: 9.7 mg/dL (ref 8.9–10.3)
Chloride: 94 mmol/L — ABNORMAL LOW (ref 98–111)
Creatinine, Ser: 6.15 mg/dL — ABNORMAL HIGH (ref 0.44–1.00)
GFR, Estimated: 7 mL/min — ABNORMAL LOW (ref >60)
Glucose, Bld: 103 mg/dL — ABNORMAL HIGH (ref 70–99)
Phosphorus: 5.4 mg/dL — ABNORMAL HIGH (ref 2.5–4.6)
Potassium: 4.2 mmol/L (ref 3.5–5.1)
Sodium: 137 mmol/L (ref 135–145)

## 2020-11-29 MED ORDER — PANTOPRAZOLE SODIUM 40 MG PO TBEC
40.0000 mg | DELAYED_RELEASE_TABLET | Freq: Every day | ORAL | Status: DC
  Administered 2020-11-29: 40 mg via ORAL
  Filled 2020-11-29 (×2): qty 1

## 2020-11-29 MED ORDER — NEPRO/CARBSTEADY PO LIQD: 237.0000 mL | Freq: Three times a day (TID) | ORAL | Status: DC

## 2020-11-29 MED ORDER — NEPRO/CARBSTEADY PO LIQD: 237.0000 mL | Freq: Three times a day (TID) | ORAL | Status: DC | PRN

## 2020-11-29 MED ORDER — METOPROLOL TARTRATE 25 MG PO TABS
25.0000 mg | ORAL_TABLET | Freq: Two times a day (BID) | ORAL | Status: DC
  Administered 2020-11-29 (×2): 25 mg via ORAL
  Filled 2020-11-29 (×3): qty 1

## 2020-11-29 MED ORDER — ZOLPIDEM TARTRATE 5 MG PO TABS
5.0000 mg | ORAL_TABLET | Freq: Every evening | ORAL | Status: DC | PRN
Start: 2020-11-29 — End: 2020-11-30

## 2020-11-29 MED ORDER — SORBITOL 70 % SOLN: 30.0000 mL | Status: DC | PRN

## 2020-11-29 MED ORDER — ONDANSETRON HCL 4 MG PO TABS: 4.0000 mg | ORAL_TABLET | Freq: Four times a day (QID) | ORAL | Status: DC | PRN

## 2020-11-29 MED ORDER — POLYETHYLENE GLYCOL 3350 17 G PO PACK
17.0000 g | PACK | Freq: Two times a day (BID) | ORAL | Status: DC
  Administered 2020-11-29: 17 g via ORAL
  Filled 2020-11-29: qty 1

## 2020-11-29 MED ORDER — ACETAMINOPHEN 325 MG PO TABS: 650.0000 mg | ORAL_TABLET | Freq: Four times a day (QID) | ORAL | Status: DC | PRN

## 2020-11-29 MED ORDER — CHLORHEXIDINE GLUCONATE CLOTH 2 % EX PADS
6.0000 | MEDICATED_PAD | Freq: Every day | CUTANEOUS | Status: DC
  Administered 2020-11-30: 6 via TOPICAL

## 2020-11-29 MED ORDER — LEVETIRACETAM 500 MG PO TABS
500.0000 mg | ORAL_TABLET | Freq: Two times a day (BID) | ORAL | Status: DC
  Administered 2020-11-29 (×2): 500 mg via ORAL
  Filled 2020-11-29 (×3): qty 1

## 2020-11-29 MED ORDER — NEPRO/CARBSTEADY PO LIQD: 237.0000 mL | Freq: Two times a day (BID) | ORAL | Status: DC

## 2020-11-29 MED ORDER — HYDROXYZINE HCL 25 MG PO TABS: 25.0000 mg | ORAL_TABLET | Freq: Three times a day (TID) | ORAL | Status: DC | PRN

## 2020-11-29 MED ORDER — HEPARIN SODIUM (PORCINE) 5000 UNIT/ML IJ SOLN
5000.0000 [IU] | Freq: Three times a day (TID) | INTRAMUSCULAR | Status: DC
  Administered 2020-11-29 – 2020-11-30 (×5): 5000 [IU] via SUBCUTANEOUS
  Filled 2020-11-29 (×5): qty 1

## 2020-11-29 MED ORDER — AMIODARONE HCL 200 MG PO TABS
200.0000 mg | ORAL_TABLET | Freq: Every day | ORAL | Status: DC
  Administered 2020-11-29: 200 mg via ORAL
  Filled 2020-11-29 (×2): qty 1

## 2020-11-29 MED ORDER — CAMPHOR-MENTHOL 0.5-0.5 % EX LOTN
1.0000 | TOPICAL_LOTION | Freq: Three times a day (TID) | CUTANEOUS | Status: DC | PRN
Start: 2020-11-28 — End: 2020-11-30

## 2020-11-29 MED ORDER — ONDANSETRON HCL 4 MG/2ML IJ SOLN: 4.0000 mg | Freq: Four times a day (QID) | INTRAMUSCULAR | Status: DC | PRN

## 2020-11-29 MED ORDER — ACETAMINOPHEN 650 MG RE SUPP: 650.0000 mg | Freq: Four times a day (QID) | RECTAL | Status: DC | PRN

## 2020-11-29 MED ORDER — CALCIUM CARBONATE ANTACID 1250 MG/5ML PO SUSP
500.0000 mg | Freq: Four times a day (QID) | ORAL | Status: DC | PRN
  Filled 2020-11-29: qty 5

## 2020-11-29 MED ORDER — ROSUVASTATIN CALCIUM 5 MG PO TABS
10.0000 mg | ORAL_TABLET | Freq: Every day | ORAL | Status: DC
  Administered 2020-11-29: 10 mg via ORAL
  Filled 2020-11-29 (×2): qty 2

## 2020-11-29 MED ORDER — RENA-VITE PO TABS
1.0000 | ORAL_TABLET | Freq: Every day | ORAL | Status: DC
  Administered 2020-11-29: 1 via ORAL
  Filled 2020-11-29: qty 1

## 2020-11-29 MED ORDER — DOCUSATE SODIUM 283 MG RE ENEM: 1.0000 | ENEMA | RECTAL | Status: DC | PRN

## 2020-11-29 MED ORDER — MIDODRINE HCL 5 MG PO TABS
10.0000 mg | ORAL_TABLET | Freq: Two times a day (BID) | ORAL | Status: DC
  Administered 2020-11-29 (×2): 10 mg via ORAL
  Filled 2020-11-29 (×3): qty 2

## 2020-11-29 NOTE — Evaluation (Signed)
Physical Therapy Evaluation Patient Details Name: [name] MRN: [id number] DOB: 1961-01-25 Today's Date: 11/29/2020   History of Present Illness  60 y.o. female with medical history significant of ESRD on hemodialysis, CAD, diastolic heart failure, HTN, anemia of chronic disease, Lt wrist fx with ORIF 11/15/20 who was discharged 11/27/20 after 2 week admission with COVID-19 pneumonia and failing to thrive. She came back 11/28/20 because she has failed to function at home. bil pleural effusions, hypokalemia, baseline confusion  Clinical Impression   Pt admitted with above diagnosis. Patient recently discharged home with family support despite PT recommendation for SNF. Family was unable to care for pt and she returned in ~1 day. Patient currently requires moderate assist for OOB transfer bed to chair and cognition more limited than previous admission. She follows simple instructions. Prior to recent admision she was independent with ambulation and basic ADLs.  Pt currently with functional limitations due to the deficits listed below (see PT Problem List). Pt will benefit from skilled PT to increase their independence and safety with mobility to allow discharge to the venue listed below.       Follow Up Recommendations SNF    Equipment Recommendations  Other (comment) (defer to SNF)    Recommendations for Other Services       Precautions / Restrictions Precautions Precautions: Fall Required Braces or Orthoses: Splint/Cast Splint/Cast: L wrist volar splint Restrictions Weight Bearing Restrictions: Yes LUE Weight Bearing: Non weight bearing      Mobility  Bed Mobility Overal bed mobility: Needs Assistance Bed Mobility: Rolling;Sidelying to Sit Rolling: Min assist Sidelying to sit: Min assist       General bed mobility comments: vc and guiding cues (step by step)    Transfers Overall transfer level: Needs assistance Equipment used: 1 person hand held assist Transfers: Stand  Pivot Transfers   Stand pivot transfers: Mod assist       General transfer comment: pt remains kyphotic during transfer; holding RUE and small pivotal steps to chair  Ambulation/Gait             General Gait Details: pivotal steps only; unable  Stairs            Wheelchair Mobility    Modified Rankin (Stroke Patients Only)       Balance Overall balance assessment: Needs assistance   Sitting balance-Leahy Scale: Fair     Standing balance support: Single extremity supported Standing balance-Leahy Scale: Poor                               Pertinent Vitals/Pain Pain Assessment: Faces Faces Pain Scale: Hurts little more Pain Location: L wrist Pain Descriptors / Indicators: Guarding Pain Intervention(s): Monitored during session    Home Living Family/patient expects to be discharged to:: Skilled nursing facility                      Prior Function Level of Independence: Needs assistance   Gait / Transfers Assistance Needed: recent admission; only able to walk ~12 ft with min assist  ADL's / Homemaking Assistance Needed: prior to last admssion able to bathe and dress; daughter did cooking and cleaning        Hand Dominance   Dominant Hand: Left    Extremity/Trunk Assessment   Upper Extremity Assessment Upper Extremity Assessment: Generalized weakness LUE Deficits / Details: in splint due to wrist fracture; pt does well not to push with  LUE LUE: Unable to fully assess due to immobilization    Lower Extremity Assessment Lower Extremity Assessment: Generalized weakness (grossly 3+)    Cervical / Trunk Assessment Cervical / Trunk Assessment: Kyphotic  Communication   Communication: Expressive difficulties  Cognition Arousal/Alertness: Awake/alert Behavior During Therapy: Flat affect Overall Cognitive Status: No family/caregiver present to determine baseline cognitive functioning                                  General Comments: patient asking for her breakfast (tray open and right in front of her); asking to eat and when she puts a bite in her mouth states she doesn't like it and spits it out; repeats this sequence x 4 with pancake; drinks milk and states doesn't like (or coffee)      General Comments General comments (skin integrity, edema, etc.): WOC RN in during transfer to assess sacrum and no wound noted by her. Pt stood ~20 seconds for this    Exercises     Assessment/Plan    PT Assessment Patient needs continued PT services  PT Problem List Decreased strength;Decreased range of motion;Decreased activity tolerance;Decreased mobility;Decreased safety awareness;Decreased cognition;Pain;Decreased balance       PT Treatment Interventions DME instruction;Gait training;Functional mobility training;Therapeutic activities;Cognitive remediation;Patient/family education;Therapeutic exercise;Balance training    PT Goals (Current goals can be found in the Care Plan section)  Acute Rehab PT Goals Patient Stated Goal: unable to state/comprehend PT Goal Formulation: Patient unable to participate in goal setting Time For Goal Achievement: 12/13/20 Potential to Achieve Goals: Fair    Frequency Min 2X/week   Barriers to discharge Decreased caregiver support      Co-evaluation               AM-PAC PT "6 Clicks" Mobility  Outcome Measure Help needed turning from your back to your side while in a flat bed without using bedrails?: A Little Help needed moving from lying on your back to sitting on the side of a flat bed without using bedrails?: A Little Help needed moving to and from a bed to a chair (including a wheelchair)?: A Lot Help needed standing up from a chair using your arms (e.g., wheelchair or bedside chair)?: A Little Help needed to walk in hospital room?: Total Help needed climbing 3-5 steps with a railing? : Total 6 Click Score: 13    End of Session Equipment Utilized  During Treatment: Gait belt Activity Tolerance: Patient limited by fatigue Patient left: in chair;with call bell/phone within reach;with chair alarm set Nurse Communication: Mobility status PT Visit Diagnosis: Muscle weakness (generalized) (M62.81);Difficulty in walking, not elsewhere classified (R26.2) Pain - Right/Left: Left Pain - part of body: Hand;Arm    Time: [id number] PT Time Calculation (min) (ACUTE ONLY): 26 min   Charges:   PT Evaluation $PT Eval Low Complexity: 1 Low PT Treatments $Therapeutic Activity: 8-22 mins         Arby Barrette, PT Pager [phone]   Rexanne Mano 11/29/2020, 9:42 AM

## 2020-11-29 NOTE — NC FL2 (Signed)
[place] MEDICAID FL2 LEVEL OF CARE SCREENING TOOL     IDENTIFICATION  Patient Name: [name] Birthdate: 1960-12-17 Sex: female Admission Date (Current Location): 11/28/2020  [place] and [place] Number:  [name] [id number] [place] and Address:  [facility], [place] [address], [place], [place] [address]      Provider Number: [id number]  Attending Physician Name and Address:  Jonetta Osgood, MD  Relative Name and Phone Number:  Wilford Sports Daughter [phone]    Current Level of Care: Hospital Recommended Level of Care: [place] Prior Approval Number:    Date Approved/Denied:   PASRR Number: [id number]  Discharge Plan: SNF    Current Diagnoses: Patient Active Problem List   Diagnosis Date Noted  . Pleural effusion 11/28/2020  . Subdural fluid collection 11/27/2020  . Left wrist fracture 11/27/2020  . Leg DVT (deep venous thromboembolism), acute, bilateral ([place]) 11/27/2020  . Hyperthyroidism 11/27/2020  . Malnutrition of moderate degree 11/20/2020  . Epigastric abdominal pain 11/08/2020  . NSTEMI (non-ST elevated myocardial infarction) ([place]) 10/16/2020  . AMS (altered mental status) 10/06/2020  . Elevated troponin 10/06/2020  . Headache, unspecified 11/28/2019  . Hypercalcemia 11/10/2019  . Allergy, unspecified, initial encounter 08/08/2019  . Hypokalemia 07/07/2019  . Sepsis, unspecified organism ([place]) 02/03/2019  . End-stage renal disease on hemodialysis ([place]) 02/03/2019  . Unspecified atrial fibrillation ([place]) 02/03/2019  . Hypotension 02/02/2019  . Syncope and collapse   . Goals of care, counseling/discussion   . Atrial fibrillation with rapid ventricular response ([place]) 03/20/2018  . Chills (without fever) 03/16/2018  . Iron deficiency anemia, unspecified 02/04/2018  . 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 Anemia in chronic kidney disease 12/30/2017  . Fever, unspecified 12/30/2017  . Generalized  abdominal pain 12/30/2017  . Heart failure, unspecified ([place]) 12/30/2017  . Other specified personal risk factors, not elsewhere classified 12/30/2017  . Pure hypercholesterolemia, unspecified 12/30/2017  . Secondary hyperparathyroidism of renal origin ([place]) 12/30/2017  . Shortness of breath 12/30/2017  . History of embolic stroke [id number]  . Altered mental status 11/08/2013  . HTN (hypertension) 11/08/2013  . Hypertensive renal disease 11/06/2013  . Protein-calorie malnutrition, severe ([employer]) 11/04/2013  . Seizure disorder ([place]) 10/22/2013  . Hypertensive urgency 10/20/2013  . Hypertensive heart disease 05/07/2013  . Acute-on-chronic renal failure ([place]) 05/07/2013  . HTN (hypertension), malignant 05/06/2013  . CAD (coronary artery disease) 05/06/2013  . ESRD (end stage renal disease) ([place]) 05/06/2013  . Chronic diastolic heart failure ([place])   . Tobacco abuse 09/25/2012  . H/O noncompliance with medical treatment, presenting hazards to health   . Renal artery stenosis ([place]) 04/05/2012  . History of subarachnoid hemorrhage 03/03/2012  . Mixed hyperlipidemia 09/19/2008  . Acute on chronic diastolic heart failure ([place]) 09/19/2008    Orientation RESPIRATION BLADDER Height & Weight     Self  Normal External catheter,Incontinent Weight:   Height:     BEHAVIORAL SYMPTOMS/MOOD NEUROLOGICAL BOWEL NUTRITION STATUS      Incontinent Diet (see d/c summary)  AMBULATORY STATUS COMMUNICATION OF NEEDS Skin   Extensive Assist   PU Stage and Appropriate Care (Stage 2 sacrum; incision on arm)                       Personal Care Assistance Level of Assistance  Bathing,Feeding,Dressing Bathing Assistance: Maximum assistance Feeding assistance: Maximum assistance Dressing Assistance: Maximum assistance     Functional Limitations Info  Sight,Hearing,Speech Sight Info: Impaired Hearing Info: Adequate Speech Info: Impaired  SPECIAL CARE FACTORS FREQUENCY  PT (By licensed PT),OT (By  licensed OT)     PT Frequency: 5X/week OT Frequency: 5X/week            Contractures Contractures Info: Not present    Additional Factors Info  Code Status,Allergies Code Status Info: Full Allergies Info: Amoxicillin, Penicillins     Isolation Precautions Info: 11/08/20 COVID positive     Current Medications (11/29/2020):  This is the current hospital active medication list Current Facility-Administered Medications  Medication Dose Route Frequency Provider Last Rate Last Admin  . acetaminophen (TYLENOL) tablet 650 mg  650 mg Oral Q6H PRN Elwyn Reach, MD       Or  . acetaminophen (TYLENOL) suppository 650 mg  650 mg Rectal Q6H PRN Elwyn Reach, MD      . amiodarone (PACERONE) tablet 200 mg  200 mg Oral Daily Jonetta Osgood, MD   200 mg at 11/29/20 1008  . calcium carbonate (dosed in mg elemental calcium) suspension 500 mg of elemental calcium  500 mg of elemental calcium Oral Q6H PRN Elwyn Reach, MD      . camphor-menthol (SARNA) lotion 1 application  1 application Topical [id number] PRN Elwyn Reach, MD       And  . hydrOXYzine (ATARAX/VISTARIL) tablet 25 mg  25 mg Oral Q8H PRN Elwyn Reach, MD      . docusate sodium (ENEMEEZ) enema 283 mg  1 enema Rectal PRN Elwyn Reach, MD      . feeding supplement (NEPRO CARB STEADY) liquid 237 mL  237 mL Oral TID PRN Elwyn Reach, MD      . feeding supplement (NEPRO CARB STEADY) liquid 237 mL  237 mL Oral BID BM Garba, Mohammad L, MD      . heparin injection 5,000 Units  5,000 Units Subcutaneous Q8H Elwyn Reach, MD   5,000 Units at 11/29/20 1009  . levETIRAcetam (KEPPRA) tablet 500 mg  500 mg Oral BID Jonetta Osgood, MD   500 mg at 11/29/20 1009  . metoprolol tartrate (LOPRESSOR) tablet 25 mg  25 mg Oral BID Jonetta Osgood, MD   25 mg at 11/29/20 1009  . midodrine (PROAMATINE) tablet 10 mg  10 mg Oral BID WC Ghimire, Henreitta Leber, MD   10 mg at 11/29/20 1015  . multivitamin (RENA-VIT) tablet 1  tablet  1 tablet Oral QHS Ghimire, Shanker M, MD      . ondansetron (ZOFRAN) tablet 4 mg  4 mg Oral Q6H PRN Elwyn Reach, MD       Or  . ondansetron (ZOFRAN) injection 4 mg  4 mg Intravenous Q6H PRN Gala Romney L, MD      . pantoprazole (PROTONIX) EC tablet 40 mg  40 mg Oral Daily Jonetta Osgood, MD   40 mg at 11/29/20 1009  . polyethylene glycol (MIRALAX / GLYCOLAX) packet 17 g  17 g Oral BID Ghimire, Shanker M, MD      . rosuvastatin (CRESTOR) tablet 10 mg  10 mg Oral Daily Jonetta Osgood, MD   10 mg at 11/29/20 1009  . sodium chloride flush (NS) 0.9 % injection 3 mL  3 mL Intravenous Once Lucrezia Starch, MD      . sorbitol 70 % solution 30 mL  30 mL Oral PRN Jonelle Sidle, Mohammad L, MD      . zolpidem (AMBIEN) tablet 5 mg  5 mg Oral QHS PRN Elwyn Reach, MD  Discharge Medications: Please see discharge summary for a list of discharge medications.  Relevant Imaging Results:  Relevant Lab Results:   Additional Information SS#: [id number] Now off COVID precautions, dialysis TTS at [facility]

## 2020-11-29 NOTE — Progress Notes (Addendum)
[name] [id number] Admission Data: 11/29/2020 4:03 AM Attending Provider: Elwyn Reach, MD  [id number], Arnette Norris, MD Consults/ Treatment Team:   [name] is a 60 y.o. female patient admitted from ED awake, alert  & orientated  X 3,  Full Code, VSS - Blood pressure (!) 146/56, pulse 67, temperature 97.9 F (36.6 C), temperature source Axillary, resp. rate 16, SpO2 98 %., O2    0 L nasal cannula, no c/o shortness of breath, no c/o chest pain, no distress noted. Bedside monitor placed and pt is currently running:normal sinus rhythm, sinus bradycardia.   IV site WDL:  upper arm left, condition patent and no redness with a transparent dsg that's clean dry and intact.  Upon arrival to the unit, sacral wound noted. Cleaned and placed sacral mepalex. LUE wrapped in ace bandage. RUE has healing incision approximated, flaky and closed with staples. Patient appears malnourished but requesting snacks. Refused Ensures drinks. Patient A&Ox1, VSS/af.  Dialysis perm cath present right upper chest. Dressing C/D/I.   Allergies:   Allergies  Allergen Reactions  . Amoxicillin Hives and Rash    Informed by pt's daughter [name]   . Penicillins Hives and Rash    Has patient had a PCN reaction causing immediate rash, facial/tongue/throat swelling, SOB or lightheadedness with hypotension: Yes Has patient had a PCN reaction causing severe rash involving mucus membranes or skin necrosis: No Has patient had a PCN reaction that required hospitalization: No Has patient had a PCN reaction occurring within the last 10 years: No If all of the above answers are "NO", then may proceed with Cephalosporin use. PATIENT TOLERATED CEFAZOLIN IN OR 11/15/20      Past Medical History:  Diagnosis Date  . Anterior cerebral artery aneurysm    1996 AT [facility]  . CAD (coronary artery disease)    a. 2009 for NSTEMI with 70% dLAD, 30% mCx, 50% dCx, 95% dRCA, 40% PLA -> RCA was treated with DES.  . Diastolic heart  failure    LVEF 60-65% with grade 2 diastolic dysfunction - July 2014  . Encephalopathy   . ESRD (end stage renal disease) on dialysis ([place])   . HTN (hypertension)    Resistant  . Hypercholesterolemia   . PAF (paroxysmal atrial fibrillation) ([place]) 02/03/2019  . Poor historian   . Seizures ([place])   . Stroke (cerebrum) ([place]) 2015  . Subarachnoid hemorrhage ([place])    03/2012  . Tobacco abuse    Resumed smoking half a pack a day. She was a smoker in the past and states she was abl eto stop in the past using nicotine patches    History:  obtained from unobtainable from patient due to mental status. Tobacco/alcohol: unknown tobacco use history unreliable  Pt orientated to unit, room and routine. Information packet given to patient.  Admission INP armband ID verified with patient, and in place. SR up x 2, fall risk assessment complete with Patient and verbalizing understanding of risks associated with falls. Pt verbalizes an understanding of how to use the call bell and to call for help before getting out of bed.  Skin, clean-dry- intact without evidence of bruising, or skin tears.   No evidence of skin break down noted on exam. no rashes    Will cont to monitor and assist as needed.  Diane G Arnez Stoneking, RN 11/29/2020 4:03 AM

## 2020-11-29 NOTE — TOC Initial Note (Signed)
Transition of Care [facility]) - Initial/Assessment Note    Patient Details  Name: [name] MRN: [id number] Date of Birth: 01-08-1961  [facility] CM/SW Contact:    [place], [place] Phone Number: 11/29/2020, 12:21 PM  Clinical Narrative:      CSW received consult for possible SNF placement at time of discharge. CSW spoke with patient's daughter, Wilford Sports [phone]). Patient's daughter expressed concerns regarding length of dialysis treatments and that it seems to drain her. Ms. Valetta Mole reported her mother's dialysis chair time is from 11:30am to 4:30pm. Patient expressed understanding of PT recommendation and is agreeable to SNF placement at time of discharge. CSW discussed insurance authorization process and that her mother will be transported by non-emergency ambulance at time of discharge. Patient has not received the COVID vaccines. Patient's daughter expressed being hopeful for rehab and to feel better soon. No further questions reported at this time.               Expected Discharge Plan: Skilled Nursing Facility Barriers to Discharge: Continued Medical Work up   Patient Goals and CMS Choice Patient states their goals for this hospitalization and ongoing recovery are:: to go to SNF   Choice offered to / list presented to : NA  Expected Discharge Plan and Services Expected Discharge Plan: [place] In-house Referral: NA Discharge Planning Services: CM Consult Post Acute Care Choice: [place] Living arrangements for the past 2 months: Single Family Home                 DME Arranged: N/A DME Agency: NA   HH Arranged: NA HH Agency: NA  Prior Living Arrangements/Services Living arrangements for the past 2 months: Single Family Home Lives with:: Adult Children Patient language and need for interpreter reviewed:: Yes Do you feel safe going back to the place where you live?: Yes      Need for Family Participation in Patient  Care: Yes (Comment) Care giver support system in place?: Yes (comment)   Criminal Activity/Legal Involvement Pertinent to Current Situation/Hospitalization: No - Comment as needed  Activities of Daily Living   ADL Screening (condition at time of admission) Patient's cognitive ability adequate to safely complete daily activities?: No Is the patient deaf or have difficulty hearing?: No Does the patient have difficulty seeing, even when wearing glasses/contacts?: No Does the patient have difficulty concentrating, remembering, or making decisions?: Yes Patient able to express need for assistance with ADLs?: Yes Does the patient have difficulty dressing or bathing?: Yes Independently performs ADLs?: No Communication: Independent Dressing (OT): Dependent Grooming: Dependent Feeding: Dependent Bathing: Dependent Toileting: Dependent In/Out Bed: Dependent Walks in Home: Dependent Does the patient have difficulty walking or climbing stairs?: Yes Weakness of Legs: Both Weakness of Arms/Hands: Both  Permission Sought/Granted Permission sought to share information with : Case Manager,Facility Contact Representative,Family Supports Permission granted to share information with : Yes, Verbal Permission Granted  Share Information with NAME: Wilford Sports  Permission granted to share info w AGENCY: SNF's  Permission granted to share info w Relationship: Patients daughter  Permission granted to share info w Contact Information: [phone]  Emotional Assessment     Affect (typically observed): Unable to Assess Orientation: : Oriented to Self Alcohol / Substance Use: Not Applicable Psych Involvement: No (comment)  Admission diagnosis:  Hyperkalemia [E87.5] Pleural effusion [J90] ESRD (end stage renal disease) ([place]) [N18.6] COVID-19 [U07.1] Patient Active Problem List   Diagnosis Date Noted  . Pleural effusion 11/28/2020  .  Subdural fluid collection 11/27/2020  . Left wrist fracture  11/27/2020  . Leg DVT (deep venous thromboembolism), acute, bilateral ([place]) 11/27/2020  . Hyperthyroidism 11/27/2020  . Malnutrition of moderate degree 11/20/2020  . Epigastric abdominal pain 11/08/2020  . NSTEMI (non-ST elevated myocardial infarction) ([place]) 10/16/2020  . AMS (altered mental status) 10/06/2020  . Elevated troponin 10/06/2020  . Headache, unspecified 11/28/2019  . Hypercalcemia 11/10/2019  . Allergy, unspecified, initial encounter 08/08/2019  . Hypokalemia 07/07/2019  . Sepsis, unspecified organism ([place]) 02/03/2019  . End-stage renal disease on hemodialysis ([place]) 02/03/2019  . Unspecified atrial fibrillation ([place]) 02/03/2019  . Hypotension 02/02/2019  . Syncope and collapse   . Goals of care, counseling/discussion   . Atrial fibrillation with rapid ventricular response ([place]) 03/20/2018  . Chills (without fever) 03/16/2018  . Iron deficiency anemia, unspecified 02/04/2018  . 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 Anemia in chronic kidney disease 12/30/2017  . Fever, unspecified 12/30/2017  . Generalized abdominal pain 12/30/2017  . Heart failure, unspecified ([place]) 12/30/2017  . Other specified personal risk factors, not elsewhere classified 12/30/2017  . Pure hypercholesterolemia, unspecified 12/30/2017  . Secondary hyperparathyroidism of renal origin ([place]) 12/30/2017  . Shortness of breath 12/30/2017  . History of embolic stroke 123XX123  . Altered mental status 11/08/2013  . HTN (hypertension) 11/08/2013  . Hypertensive renal disease 11/06/2013  . Protein-calorie malnutrition, severe ([place]) 11/04/2013  . Seizure disorder ([place]) 10/22/2013  . Hypertensive urgency 10/20/2013  . Hypertensive heart disease 05/07/2013  . Acute-on-chronic renal failure ([place]) 05/07/2013  . HTN (hypertension), malignant 05/06/2013  . CAD (coronary artery disease) 05/06/2013  . ESRD (end stage renal disease) ([place]) 05/06/2013  . Chronic diastolic heart failure ([place])   . Tobacco  abuse 09/25/2012  . H/O noncompliance with medical treatment, presenting hazards to health   . Renal artery stenosis ([place]) 04/05/2012  . History of subarachnoid hemorrhage 03/03/2012  . Mixed hyperlipidemia 09/19/2008  . Acute on chronic diastolic heart failure ([place]) 09/19/2008   PCP:  Clovia Cuff, MD Pharmacy:   [id number] [place] [place]Phone: [phone]: [phone] [facility] [place]Phone: [phone]: [phone]    Social Determinants of Health (SDOH) Interventions    Readmission Risk Interventions No flowsheet data found.

## 2020-11-29 NOTE — Progress Notes (Signed)
PROGRESS NOTE                                                                                                                                                                                                             Patient Demographics:    [name], is a 60 y.o. female, DOB - [birth year], AY:[id number]  Outpatient Primary MD for the patient is Clovia Cuff, MD   Admit date - 11/28/2020   LOS - 1  Chief Complaint  Patient presents with  . Altered Mental Status       Brief Narrative: Patient is a 60 y.o. 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 fib-not on anticoagulation, seizure disorder-just discharged from the hospital on 2/8 after a prolonged stay for Covid pneumonia-brought back on 2/9 due to failure to thrive syndrome/severe generalized weakness-as the family was not able to manage her at home (refused SNF previously).  COVID-19 vaccinated status: Unvaccinated  Significant Events: 1/20-2/8>> hospitalized for COVID-19 pneumonia-discharged home with home health services 2/9>> Admit to [facility] for weakness-family unable to care for her-requesting SNF.  Significant studies: 2/9>> CT head: No acute intracranial process 2/9>> chest x-ray: Bilateral pleural effusions/bibasilar atelectasis  COVID-19 medications: Remdesivir: 1/21-1/25  Antibiotics: None  Microbiology data: None  Procedures: None  Consults: Renal  DVT prophylaxis: heparin injection 5,000 Units Start: 11/29/20 0045    Subjective:    [name] today is lying comfortably in bed.  No major issues overnight.   Assessment  & Plan :   Failure to thrive/generalized weakness: Family unable to take care of patient at home-plans are for SNF on discharge.  SNF was recommended last admit-patient's family refused and took her home.  Social worker following.  COVID-19  pneumonia:asymptomatic currently-completed Remdesivir last admit-8 on room air-we will complete 21 days of isolation on 2/10.  ESRD: On HD TTS-nephrology following  Normocytic anemia: Secondary to ESRD-hemoglobin stable-follow periodically.  PAF: Continue metoprolol/amiodarone-not a candidate for anticoagulation given ICH/SAH/anemia  RLE DVT- Age indeterminate:reviewed discharge summary done by Dr. [name] a candidate for anticoagulation due to [place].  IR was consulted for IVC filter-but was not recommended.  Left wrist fracture: S/p ORIF on 1/27-Per DC summary-needs splint/suture removal 2 weeks from 1/27.  Hyperthyroidism: On beta-blocker-apparently methimazole was discontinued last admit-plans are for endocrinology follow-up as  an outpatient.  She does not appear to have thyrotoxicosis clinically.  Bilateral SDH: Recently evaluated by neurosurgery-nonoperative management recommended  Seizure disorder: On Keppra  Remote history of history of ICH/SAH-VP shunt in place  Severe failure to thrive syndrome/deconditioning/debility: PT following-awaiting SNF placement  RN pressure injury documentation: Pressure Injury 11/29/20 Sacrum Posterior Stage 2 -  Partial thickness loss of dermis presenting as a shallow open injury with a red, pink wound bed without slough. (Active)  11/29/20 0400  Location: Sacrum  Location Orientation: Posterior  Staging: Stage 2 -  Partial thickness loss of dermis presenting as a shallow open injury with a red, pink wound bed without slough.  Wound Description (Comments):   Present on Admission: Yes    ABG:    Component Value Date/Time   PHART 7.429 11/09/2013 0335   PCO2ART 38.1 11/09/2013 0335   PO2ART 80.1 11/09/2013 0335   HCO3 15.8 (L) 11/15/2020 1413   TCO2 31 11/28/2020 2027   ACIDBASEDEF 9.0 (H) 11/15/2020 1413   O2SAT 84.0 11/15/2020 1413    Vent Settings: N/A  Condition - Guarded  Family Communication  :  [name]-[phone]-updated on 2/10  Code Status :  Full Code  Diet :  Diet Order            Diet renal with fluid restriction Fluid restriction: 1200 mL Fluid; Room service appropriate? Yes; Fluid consistency: Thin  Diet effective now                  Disposition Plan  :   Status is: Inpatient  Remains inpatient appropriate because:Unsafe d/c plan   Dispo: The patient is from: Home              Anticipated d/c is to: SNF              Anticipated d/c date is: 1 day              Patient currently is medically stable to d/c.   Difficult to place patient No  Barriers to discharge: Unsafe disposition-family unable to manage at home-needs SNF-awaiting bed.  Antimicorbials  :    Anti-infectives (From admission, onward)   None      Inpatient Medications  Scheduled Meds: . amiodarone  200 mg Oral Daily  . feeding supplement (NEPRO CARB STEADY)  237 mL Oral BID BM  . heparin  5,000 Units Subcutaneous Q8H  . levETIRAcetam  500 mg Oral BID  . metoprolol tartrate  25 mg Oral BID  . midodrine  10 mg Oral BID WC  . multivitamin  1 tablet Oral QHS  . pantoprazole  40 mg Oral Daily  . polyethylene glycol  17 g Oral BID  . rosuvastatin  10 mg Oral Daily  . sodium chloride flush  3 mL Intravenous Once   Continuous Infusions: PRN Meds:.acetaminophen **OR** acetaminophen, calcium carbonate (dosed in mg elemental calcium), camphor-menthol **AND** hydrOXYzine, docusate sodium, feeding supplement (NEPRO CARB STEADY), ondansetron **OR** ondansetron (ZOFRAN) IV, sorbitol, zolpidem   Time Spent in minutes  25  See all Orders from today for further details   [name] M.D on 11/29/2020 at 10:26 AM  To page go to [email or web address] - use universal password  [facility] -  Office  [phone]    Objective:   Vitals:   11/29/20 0100 11/29/20 0200 11/29/20 0300 11/29/20 0348  BP: (!) 119/59 (!) 126/56 128/61 (!) 146/56  Pulse: 68 68 69 67  Resp: 20 17 (!) 22  16  Temp:  97.9 F (36.6 C)  TempSrc:    Axillary  SpO2: 95% 96% 96% 98%    Wt Readings from Last 3 Encounters:  11/27/20 40.4 kg  10/26/20 40.7 kg  10/17/20 42.5 kg    No intake or output data in the 24 hours ending 11/29/20 1026   Physical Exam Gen Exam:Alert awake-not in any distress.  Appears frail/chronically sick appearing HEENT:atraumatic, normocephalic Chest: B/L clear to auscultation anteriorly CVS:S1S2 regular Abdomen:soft non tender, non distended Extremities:no edema Neurology: Generalized weakness but appears to be nonfocal Skin: no rash   Data Review:    CBC Recent Labs  Lab 11/24/20 0747 11/25/20 0433 11/27/20 0456 11/28/20 1932 11/28/20 2027 11/29/20 0547  WBC 10.1 10.4 13.8* 12.8*  --  9.6  HGB 10.9* 11.3* 11.7* 12.1 13.6 13.6  HCT 33.3* 33.3* 33.7* 35.6* 40.0 42.2  PLT 188 170 170 204  --  220  MCV 82.8 83.9 82.6 83.6  --  86.3  MCH 27.1 28.5 28.7 28.4  --  27.8  MCHC 32.7 33.9 34.7 34.0  --  32.2  RDW 23.3* 23.0* 22.1* 22.6*  --  23.5*  LYMPHSABS  --   --   --  1.1  --   --   MONOABS  --   --   --  0.5  --   --   EOSABS  --   --   --  0.0  --   --   BASOSABS  --   --   --  0.0  --   --     Chemistries  Recent Labs  Lab 11/24/20 0747 11/25/20 0433 11/27/20 0456 11/28/20 1932 11/28/20 2027 11/29/20 0547  NA 139 137 136 138 134* 137  K 5.0 5.9* 4.4 3.9 5.2* 4.2  CL 97* 97* 94* 98 97* 94*  CO2 24 20* 25 24  --  25  GLUCOSE 93 96 85 115* 108* 103*  BUN 25* 33* 35* 29* 44* 32*  CREATININE 4.85* 5.89* 6.21* 5.71* 5.80* 6.15*  CALCIUM 9.1 9.6 9.3 9.3  --  9.7  AST  --   --   --  24  --   --   ALT  --   --   --  16  --   --   ALKPHOS  --   --   --  56  --   --   BILITOT  --   --   --  1.1  --   --    ------------------------------------------------------------------------------------------------------------------ No results for input(s): CHOL, HDL, LDLCALC, TRIG, CHOLHDL, LDLDIRECT in the last 72 hours.  Lab Results  Component  Value Date   HGBA1C 5.2 10/07/2020   ------------------------------------------------------------------------------------------------------------------ No results for input(s): TSH, T4TOTAL, T3FREE, THYROIDAB in the last 72 hours.  Invalid input(s): FREET3 ------------------------------------------------------------------------------------------------------------------ No results for input(s): VITAMINB12, FOLATE, FERRITIN, TIBC, IRON, RETICCTPCT in the last 72 hours.  Coagulation profile Recent Labs  Lab 11/28/20 1932  INR 1.2    No results for input(s): DDIMER in the last 72 hours.  Cardiac Enzymes No results for input(s): CKMB, TROPONINI, MYOGLOBIN in the last 168 hours.  Invalid input(s): CK ------------------------------------------------------------------------------------------------------------------    Component Value Date/Time   BNP >4,500.0 (H) 11/13/2020 1027    Micro Results No results found for this or any previous visit (from the past 240 hour(s)).  Radiology Reports CT ABDOMEN PELVIS WO CONTRAST  Result Date: 11/09/2020 CLINICAL DATA:  Acute generalized abdominal pain. EXAM: CT ABDOMEN AND PELVIS WITHOUT CONTRAST TECHNIQUE: Multidetector  CT imaging of the abdomen and pelvis was performed following the standard protocol without IV contrast. COMPARISON:  November 08, 2020. FINDINGS: Lower chest: Increased left basilar opacity is noted concerning for worsening pneumonia or atelectasis. Mild right posterior basilar subsegmental atelectasis is noted. Hepatobiliary: No gallstones or biliary dilatation is noted. Multiple rounded hepatic low densities are noted most consistent with cysts. Pancreas: No definite evidence of acute inflammation is noted. Stable mild nonspecific ductal dilatation is noted. Spleen: Normal in size without focal abnormality. Adrenals/Urinary Tract: Adrenal glands appear normal. Bilateral renal atrophy is noted consistent with end-stage renal disease.  Left renal cysts are noted. No hydronephrosis or renal obstruction is noted. Urinary bladder is unremarkable. Stomach/Bowel: Stomach is within normal limits. Appendix appears normal. Large amount of stool is again noted in the rectum concerning for impaction. No abnormal bowel dilatation is otherwise noted. Vascular/Lymphatic: Aortic atherosclerosis. No enlarged abdominal or pelvic lymph nodes. Reproductive: Status post hysterectomy. No adnexal masses. Other: Ventriculoperitoneal shunt is noted with tip in left side of abdomen. No hernia or ascites is noted. Musculoskeletal: No acute or significant osseous findings. IMPRESSION: 1. Increased left basilar opacity is noted concerning for worsening pneumonia or atelectasis. 2. Bilateral renal atrophy is noted consistent with end-stage renal disease. 3. Aortic atherosclerosis. 4. Large amount of stool is again noted in the rectum concerning for impaction. Aortic Atherosclerosis (ICD10-I70.0). Electronically Signed   By: Marijo Conception M.D.   On: 11/09/2020 15:05   DG Chest 1 View  Result Date: 11/26/2020 CLINICAL DATA:  Right thoracentesis EXAM: CHEST  1 VIEW COMPARISON:  11/26/2020 FINDINGS: Single frontal view of the chest demonstrates decreased right pleural effusion after recent thoracentesis, with small residual right pleural effusion. Increased aeration at the right lung base, with moderate residual atelectasis or airspace disease. Stable left basilar consolidation and effusion. No evidence of pneumothorax. Stable catheter in the left supraclavicular region, right-sided ventriculostomy catheter, and right-sided dialysis catheter. Loop recorder within the left anterior chest unchanged. Cardiac silhouette is stable. IMPRESSION: 1. Decreased right pleural effusion after thoracentesis. No evidence of complication. 2. Improving aeration at the right lung base, with moderate residual consolidation and/or atelectasis. 3. Stable left basilar consolidation and effusion.  Electronically Signed   By: Randa Ngo M.D.   On: 11/26/2020 15:30   DG Chest 2 View  Result Date: 11/08/2020 CLINICAL DATA:  Nausea x1 day. EXAM: CHEST - 2 VIEW COMPARISON:  October 15, 2020 FINDINGS: The lateral views limited in evaluation secondary to positioning of the patient's upper extremities. There is stable right-sided venous catheter positioning. Tubing from a ventriculoperitoneal shunt is also seen. Mild atelectasis and/or infiltrate is seen within the retrocardiac region of the left lung base. There is no evidence of a pleural effusion or pneumothorax. The cardiac silhouette is mildly enlarged. Marked severity calcification of the thoracic aorta is seen. The visualized skeletal structures are unremarkable. IMPRESSION: Mild left basilar atelectasis and/or infiltrate. Electronically Signed   By: Virgina Norfolk M.D.   On: 11/08/2020 19:52   DG Wrist 2 Views Left  Result Date: 11/09/2020 CLINICAL DATA:  Fall EXAM: LEFT WRIST - 2 VIEW COMPARISON:  None. FINDINGS: There is a comminuted impacted distal radius fracture with slight posterior angulation. There is a non displaced ulnar styloid fracture. Overlying soft tissue swelling is seen. IMPRESSION: Comminuted impacted distal radius fracture. Nondisplaced ulnar styloid fracture. Electronically Signed   By: Prudencio Pair M.D.   On: 11/09/2020 02:22   CT HEAD WO CONTRAST  Result Date: 11/28/2020  CLINICAL DATA:  Altered level of consciousness, lethargic EXAM: CT HEAD WITHOUT CONTRAST TECHNIQUE: Contiguous axial images were obtained from the base of the skull through the vertex without intravenous contrast. COMPARISON:  11/19/2020 FINDINGS: Brain: Chronic ischemic changes are seen within the left cerebellar hemisphere, left parietooccipital lobe, and bilateral periventricular white matter. No signs of acute infarct or hemorrhage. Lateral ventricles and midline structures are stable. Right frontal ventriculostomy catheter unchanged. No acute  extra-axial fluid collections. No mass effect. 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 No hyperdense vessel. Stable atherosclerosis. Skull: Stable postsurgical changes from right frontal craniotomy. No acute bony abnormalities. Sinuses/Orbits: No acute finding. Other: None. IMPRESSION: 1. No acute intracranial process. 2. Chronic ischemic changes throughout the white matter, left parietooccipital lobe, and left cerebellar hemisphere, stable. 3. Stable postsurgical changes as above. Electronically Signed   By: Randa Ngo M.D.   On: 11/28/2020 21:14   CT HEAD WO CONTRAST  Result Date: 11/19/2020 CLINICAL DATA:  Subdural hematoma, shunted hydrocephalus, follow-up EXAM: CT HEAD WITHOUT CONTRAST TECHNIQUE: Contiguous axial images were obtained from the base of the skull through the vertex without intravenous contrast. COMPARISON:  11/17/2020 FINDINGS: Brain: Thin left cerebral convexity hypodense subdural collection is stable in size. Trace right cerebral convexity hypodense subdural collection is also unchanged. No significant mass effect. Ventricles are stable in caliber. Right frontal approach shunt catheter is in stable position. 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 Additional patchy and confluent areas of hypoattenuation likely reflecting stable chronic microvascular ischemic changes. There is gliosis along the catheter tract. No new intracranial hemorrhage. No new loss of gray-white differentiation. 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 Coil packing at the basilar tip with associated streak artifact.There is atherosclerotic calcification at the skull base. Skull: Right craniotomy and burr hole. Sinuses/Orbits: No acute finding. Other: None. IMPRESSION: No substantial change in thin cerebral convexity  subdural collections. No significant mass effect. Stable ventricle caliber and positioning of shunt catheter. Electronically Signed   By: Macy Mis M.D.   On: 11/19/2020 09:30   CT HEAD WO CONTRAST  Result Date: 11/17/2020 CLINICAL DATA:  60 year old female with acute mental status changes EXAM: CT HEAD WITHOUT CONTRAST TECHNIQUE: Contiguous axial images were obtained from the base of the skull through the vertex without intravenous contrast. COMPARISON:  10/06/2020, 02/16/2018, 11/11/2013 FINDINGS: Brain: New bilateral frontal parietal extra-axial fluid collections, slightly larger on the left, intermediate density. On the left, fluid extends over the posterior temporal region. The greatest depth measured on the left from the inner table to the cortex is approximately 7 mm. On the right the greatest depth estimated is 4 mm. Slight local mass effect of the underlying brain parenchyma. Confluent hypodensity in the left posterior sylvian/parietal region, demonstrated on sequential CT studies dating to 2015. Internal density is somewhat higher than expected for chronic infarction, without contraction. Right frontal approach ventriculostomy again terminating left of midline at the third ventricle. Margin of hypodensity along the tract is again demonstrated with no acute hemorrhage. Focal hypodensity in the left cerebellar hemisphere, unchanged. Coil mass with associated artifact at the basilar tip. Surgical clips of right MCA aneurysms, with associated artifact. Subcortical hypodensity in the left frontal and parietal region, unchanged from prior. No acute intracranial hemorrhage.  No midline shift. Vascular: Atherosclerotic calcifications. Surgical clips of right MCA aneurysm, paraclinoid and at the M 2 distribution. Coil  mass at the basilar tip again demonstrated. Skull: Surgical changes of prior right pterional craniotomy. Surgical changes of right frontal approach ventriculostomy with the tip of the shunt  drain terminating left of midline in the third ventricle. Sinuses/Orbits: Unremarkable paranasal sinuses. Other: None IMPRESSION: New bilateral frontoparietal subdural collection, mixed density with mild mass effect of the underlying brain. The left a slightly thicker measuring 7 mm, and also extends inferiorly over the temporal lobe. Right-sided collection measures 4 mm. The relatively hypodense region of the left parietal lobe is more dense than expected for an infarction that occurred in 2015, without the expected contraction/evolution of typical infarction appearance. A low grade glioma should be considered, alternatively local infection and further evaluation with contrast MRI is recommended. Redemonstration of surgical changes of prior right MCA aneurysm clipping x2, basilar tip aneurysm coiling, right frontal approach ventriculostomy drain, as above. Electronically Signed   By: Corrie Mckusick D.O.   On: 11/17/2020 14:44   CT ABDOMEN PELVIS W CONTRAST  Result Date: 11/08/2020 CLINICAL DATA:  Epigastric pain EXAM: CT ABDOMEN AND PELVIS WITH CONTRAST TECHNIQUE: Multidetector CT imaging of the abdomen and pelvis was performed using the standard protocol following bolus administration of intravenous contrast. CONTRAST:  51m OMNIPAQUE IOHEXOL 300 MG/ML  SOLN COMPARISON:  None. FINDINGS: Lower chest: The visualized heart size within normal limits. No pericardial fluid/thickening. No hiatal hernia. A small left pleural effusion is seen. Patchy airspace opacity seen at the left lung base. Hepatobiliary: Multiple low-density lesions are seen throughout the liver parenchyma the largest measuring 1.2 cm in the superior right liver lobe.The main portal vein is patent. No evidence of calcified gallstones, gallbladder wall thickening or biliary dilatation. Pancreas: There is mild pancreatic ductal dilatation measuring up to 2 mm. This is nonspecific however. Spleen: Normal in size without focal abnormality.  Adrenals/Urinary Tract: Both adrenal glands appear normal. Mild bilateral renal atrophy seen. There is scattered calcification the largest measuring 3 mm in the midpole of the right kidney. No hydronephrosis. Bladder is unremarkable. Stomach/Bowel: The stomach, small bowel, are normal in appearance. There is scattered colonic diverticula. There is a moderately distended rectal fecal ball. Vascular/Lymphatic: There are no enlarged mesenteric, retroperitoneal, or pelvic lymph nodes. Scattered aortic atherosclerotic calcifications are seen without aneurysmal dilatation. Reproductive: The patient is status post hysterectomy. No adnexal masses or collections seen. Other: No evidence of abdominal wall mass or hernia. Musculoskeletal: No acute or significant osseous findings. IMPRESSION: Small left pleural effusion with patchy airspace opacity at the left lung base which may be due to atelectasis and/or infectious etiology Nonspecific mild pancreatic ductal dilatation Diverticulosis without diverticulitis Moderately dilated rectum with a fecal ball Aortic Atherosclerosis (ICD10-I70.0). Electronically Signed   By: BPrudencio PairM.D.   On: 11/08/2020 20:34   DG Chest Portable 1 View  Result Date: 11/28/2020 CLINICAL DATA:  COVID infection 3 weeks ago.  Worsening weakness. EXAM: PORTABLE CHEST 1 VIEW COMPARISON:  11/26/2020 FINDINGS: Medium-sized pleural effusions with basilar atelectasis. Mild cardiomegaly. Right IJ approach central venous catheter tip is at the cavoatrial junction. Partially visualized shunt catheter. IMPRESSION: Worsened medium-sized bilateral pleural effusions and basilar atelectasis. Electronically Signed   By: KUlyses JarredM.D.   On: 11/28/2020 20:37   DG CHEST PORT 1 VIEW  Result Date: 11/26/2020 CLINICAL DATA:  Tachypnea EXAM: PORTABLE CHEST 1 VIEW COMPARISON:  November 25, 2020 FINDINGS: Central catheter tip is at the cavoatrial junction, stable. A shunt extends along the right hemithorax. Loop  recorder again noted on the left.  No pneumothorax. There is moderate partially loculated pleural effusion on the right with smaller pleural effusion on the left. There is airspace opacity in each lower lung region and right mid lung region, stable. Heart is mildly enlarged with pulmonary venous hypertension, stable. No adenopathy. There is aortic atherosclerosis. No bone lesions. IMPRESSION: Stable catheter positions. No pneumothorax. Pleural effusions bilaterally, larger on the right than the left. Areas of airspace opacity in the right mid lung and bibasilar regions, stable. Stable cardiac silhouette. Aortic Atherosclerosis (ICD10-I70.0). Electronically Signed   By: Lowella Grip III M.D.   On: 11/26/2020 08:03   DG CHEST PORT 1 VIEW  Result Date: 11/25/2020 CLINICAL DATA:  60 year old female with hypoxia. EXAM: PORTABLE CHEST 1 VIEW COMPARISON:  Portable chest 11/21/2020 and earlier. FINDINGS: Portable AP semi upright view at 0736 hours. Stable right chest dual lumen dialysis type catheter. Left chest cardiac loop recorder or external defibrillator and right neck and chest shunt catheter are stable. Calcified aortic atherosclerosis. Decreased but not resolved veiling opacity in the left lower lung. Continued dense lower lobe opacity. Moderate Veiling opacity not significantly changed in the right lung. No pneumothorax. Upper lung pulmonary vascularity appears normalized. No air bronchograms. Stable cardiac size and mediastinal contours. Visualized tracheal air column is within normal limits. No acute osseous abnormality identified. IMPRESSION: 1. Regressed pulmonary edema since 11/21/2020. Moderate right and small left pleural effusions persist with lower lobe collapse or consolidation. 2. No new cardiopulmonary abnormality. Electronically Signed   By: Genevie Ann M.D.   On: 11/25/2020 09:15   DG Chest Port 1 View  Result Date: 11/22/2020 CLINICAL DATA:  Acute respiratory distress.  Rapid response. EXAM:  PORTABLE CHEST 1 VIEW COMPARISON:  Radiograph 11/10/2020 FINDINGS: Dual lead right-sided dialysis catheter remains in place. Implanted limited loop recorder projects over the left chest wall. Presumed shunt catheter tubing projects over the right hemithorax. Small bore catheter projects over the supraclavicular soft tissues on the left. Progressive bilateral pleural effusions from prior exam, right greater than left. Development of pulmonary edema. Cardiomegaly is grossly similar. Dense aortic atherosclerosis. No pneumothorax. IMPRESSION: 1. Bilateral pleural effusions, moderately large on the right and small to moderate on the left. 2. Progressive pulmonary edema. 3. Chronic cardiomegaly.  Aortic Atherosclerosis (ICD10-I70.0). Electronically Signed   By: Keith Rake M.D.   On: 11/22/2020 00:01   DG Chest Port 1 View  Result Date: 11/09/2020 CLINICAL DATA:  Shortness of breath EXAM: PORTABLE CHEST 1 VIEW COMPARISON:  November 08, 2020 FINDINGS: Central catheter tip is at the cavoatrial junction. There is a intraperitoneal shunt catheter extending along the medial right hemithorax, stable. There is ill-defined airspace opacity in the left lower lobe. Lungs elsewhere clear. Heart is mildly enlarged with pulmonary venous hypertension. There is aortic atherosclerosis. No adenopathy. There is a loop recorder on the left. No bone lesions. IMPRESSION: Central catheter position unchanged. No pneumothorax. Airspace opacity likely representing pneumonia left lower lobe. Lungs elsewhere clear. There is mild cardiomegaly with a degree of pulmonary vascular congestion. Aortic Atherosclerosis (ICD10-I70.0). Electronically Signed   By: Lowella Grip III M.D.   On: 11/09/2020 08:37   DG Abd Portable 1V  Result Date: 11/17/2020 CLINICAL DATA:  Constipation and abdominal distension EXAM: PORTABLE ABDOMEN - 1 VIEW COMPARISON:  Abdominal CT from 8 days ago FINDINGS: The bowel gas pattern is normal. No abnormal stool  retention. Peritoneal catheter which loops in the pelvis. Extensive atheromatous calcification. Opacified retrocardiac lung with evidence of small right pleural effusion, unchanged from  preceding CT. IMPRESSION: Normal bowel gas pattern.  No abnormal stool retention. Electronically Signed   By: Monte Fantasia M.D.   On: 11/17/2020 10:33   DG Abd Portable 1V  Result Date: 11/09/2020 CLINICAL DATA:  Abdominal pain, anemia EXAM: PORTABLE ABDOMEN - 1 VIEW COMPARISON:  11/08/2020 CT abdomen/pelvis FINDINGS: No disproportionately dilated small bowel loops. Moderate rectal stool, similar. Mild to moderate colonic stool and gas. No evidence of pneumatosis or pneumoperitoneum. Right-sided VP shunt catheter terminates in the left abdomen and appears intact and without kink. No radiopaque nephrolithiasis. IMPRESSION: Nonobstructive bowel gas pattern. Moderate rectal stool, similar to recent CT. Mild to moderate colonic stool and gas. Electronically Signed   By: Ilona Sorrel M.D.   On: 11/09/2020 10:11   DG MINI C-ARM IMAGE ONLY  Result Date: 11/15/2020 There is no interpretation for this exam.  This order is for images obtained during a surgical procedure.  Please See "Surgeries" Tab for more information regarding the procedure.   VAS [place] LOWER EXTREMITY VENOUS (DVT)  Result Date: 11/17/2020  Lower Venous DVT Study Indications: Covid-19, Age indeterminate DVT found in the right common femoral vein 11/14/20, unable to receive anticoagulants secondary to [place].  Limitations: Pain with compression. Comparison Study: Prior study done 11/14/20 is available for comparison Performing Technologist: Sharion Dove RVS  Examination Guidelines: A complete evaluation includes B-mode imaging, spectral Doppler, color Doppler, and power Doppler as needed of all accessible portions of each vessel. Bilateral testing is considered an integral part of a complete examination. Limited examinations for reoccurring indications may be  performed as noted. The reflux portion of the exam is performed with the patient in reverse Trendelenburg.  +---------+---------------+---------+-----------+----------+-------------------+ RIGHT    CompressibilityPhasicitySpontaneityPropertiesThrombus Aging      +---------+---------------+---------+-----------+----------+-------------------+ CFV      Full                                         pulsatile flow, age                                                       indeterminate       +---------+---------------+---------+-----------+----------+-------------------+ SFJ      Full                                                             +---------+---------------+---------+-----------+----------+-------------------+ FV Prox                                               patent by color and                                                       Doppler             +---------+---------------+---------+-----------+----------+-------------------+  FV Mid                                                patent by color and                                                       Doppler             +---------+---------------+---------+-----------+----------+-------------------+ FV Distal                                             patent by color and                                                       Doppler             +---------+---------------+---------+-----------+----------+-------------------+ POP                                                   pulsatile flow,                                                           patent by color and                                                       Doppler             +---------+---------------+---------+-----------+----------+-------------------+ PTV      Full                                                              +---------+---------------+---------+-----------+----------+-------------------+ PERO     Full                                                             +---------+---------------+---------+-----------+----------+-------------------+ small, age indeterminate thrombus attached to vein wall  +---------+---------------+---------+-----------+----------+--------------+ LEFT     CompressibilityPhasicitySpontaneityPropertiesThrombus Aging +---------+---------------+---------+-----------+----------+--------------+ CFV      Full  pulsatile flow +---------+---------------+---------+-----------+----------+--------------+ SFJ      Full                                                        +---------+---------------+---------+-----------+----------+--------------+ FV Prox  Full                                                        +---------+---------------+---------+-----------+----------+--------------+ FV Mid   Full                                                        +---------+---------------+---------+-----------+----------+--------------+ FV DistalFull                                                        +---------+---------------+---------+-----------+----------+--------------+ PFV      Full                                                        +---------+---------------+---------+-----------+----------+--------------+ POP      Full                                         pulsatile flow +---------+---------------+---------+-----------+----------+--------------+ PTV      Full                                                        +---------+---------------+---------+-----------+----------+--------------+     Summary: RIGHT: - Findings consistent with age indeterminate deep vein thrombosis involving the right common femoral vein. - Findings appear improved from previous examination.  LEFT: - No evidence of  deep vein thrombosis in the lower extremity. No indirect evidence of obstruction proximal to the inguinal ligament.  *See table(s) above for measurements and observations. Electronically signed by Jamelle Haring on 11/17/2020 at 2:46:36 PM.    Final    VAS [place] LOWER EXTREMITY VENOUS (DVT)  Result Date: 11/14/2020  Lower Venous DVT Study Indications: D-dimer, Covid-19.  Comparison Study: No prior studies. Performing Technologist: Darlin Coco RDMS  Examination Guidelines: A complete evaluation includes B-mode imaging, spectral Doppler, color Doppler, and power Doppler as needed of all accessible portions of each vessel. Bilateral testing is considered an integral part of a complete examination. Limited examinations for reoccurring indications may be performed as noted. The reflux portion of the exam is performed with the patient in reverse Trendelenburg.  +---------+---------------+---------+-----------+----------+-----------------+ RIGHT    CompressibilityPhasicitySpontaneityPropertiesThrombus Aging    +---------+---------------+---------+-----------+----------+-----------------+ CFV  Partial        No       Yes                  Age Indeterminate +---------+---------------+---------+-----------+----------+-----------------+ SFJ      Full           No       Yes                                    +---------+---------------+---------+-----------+----------+-----------------+ FV Prox                 No       Yes                                    +---------+---------------+---------+-----------+----------+-----------------+ FV Mid                  No       Yes                                    +---------+---------------+---------+-----------+----------+-----------------+ FV Distal               No       Yes                                    +---------+---------------+---------+-----------+----------+-----------------+ PFV                     No       Yes                                     +---------+---------------+---------+-----------+----------+-----------------+ POP                     No       Yes                                    +---------+---------------+---------+-----------+----------+-----------------+ PTV                     No       Yes                                    +---------+---------------+---------+-----------+----------+-----------------+   +---------+---------------+---------+-----------+----------+--------------+ LEFT     CompressibilityPhasicitySpontaneityPropertiesThrombus Aging +---------+---------------+---------+-----------+----------+--------------+ CFV      Full           No       Yes                                 +---------+---------------+---------+-----------+----------+--------------+ SFJ      Full                                                        +---------+---------------+---------+-----------+----------+--------------+  FV Prox  Full                                                        +---------+---------------+---------+-----------+----------+--------------+ FV Mid   Full                                                        +---------+---------------+---------+-----------+----------+--------------+ FV DistalFull                                                        +---------+---------------+---------+-----------+----------+--------------+ PFV      Full           No       Yes                                 +---------+---------------+---------+-----------+----------+--------------+ POP      Full           No       Yes                                 +---------+---------------+---------+-----------+----------+--------------+ PTV      Full                                                        +---------+---------------+---------+-----------+----------+--------------+ PERO     Full                                                         +---------+---------------+---------+-----------+----------+--------------+     Summary: RIGHT: - Findings consistent with a partial, non-obstructing age indeterminate deep vein thrombosis involving the right common femoral vein. - No cystic structure found in the popliteal fossa.  LEFT: - There is no evidence of deep vein thrombosis in the lower extremity.  - No cystic structure found in the popliteal fossa.  *See table(s) above for measurements and observations. Electronically signed by Harold Barban MD on 11/14/2020 at 9:38:42 PM.    Final    [place] EKG SITE RITE  Result Date: 11/09/2020 If Site Rite image not attached, placement could not be confirmed due to current cardiac rhythm.  IR THORACENTESIS ASP PLEURAL SPACE W/IMG GUIDE  Result Date: 11/26/2020 INDICATION: Patient with history of COVID-19 pneumonia, ESRD on HD, atrial fibrillation, dyspnea, and bilateral pleural effusions, R>L. Request is made for therapeutic right thoracentesis. EXAM: ULTRASOUND GUIDED THERAPEUTIC RIGHT THORACENTESIS MEDICATIONS: 7 mL 1% lidocaine COMPLICATIONS: None immediate. PROCEDURE: An ultrasound guided thoracentesis was thoroughly discussed with the patient and questions answered. The benefits, risks, alternatives and complications  were also discussed. The patient understands and wishes to proceed with the procedure. Written consent was obtained. Ultrasound was performed to localize and mark an adequate pocket of fluid in the right chest. The area was then prepped and draped in the normal sterile fashion. 1% Lidocaine was used for local anesthesia. Under ultrasound guidance a 6 Fr Safe-T-Centesis catheter was introduced. Thoracentesis was performed. The catheter was removed and a dressing applied. FINDINGS: A total of approximately 1 L of hazy gold fluid was removed. Procedure was stopped after 1 L per patient request due to patient's symptom (chest pain). IMPRESSION: Successful ultrasound guided right thoracentesis yielding 1  L of pleural fluid. Read by: Earley Abide, PA-C Electronically Signed   By: Corrie Mckusick D.O.   On: 11/26/2020 15:26

## 2020-11-29 NOTE — Consult Note (Signed)
[facility] Nurse Consult Note: Reason for Consult: Consult requested for sacrum.  Pt is in isolation for Covid and is very emeciated with protruding sacrum bone.  Was standing with PT during assessment. Wound type: Stage 2 pressure injury; 1X1X.1cm, pink and dry Pressure Injury POA: Yes Dressing procedure/placement/frequency: Topical treatment orders provided for bedside nurses to perform as follows to protect from further injury: Foam dressing to sacrum, change Q 3 days or PRN soiling. Please re-consult if further assistance is needed.  Gae Dry MSN, RN, [place], [place], [place]  :

## 2020-11-29 NOTE — Progress Notes (Signed)
Initial Nutrition Assessment  DOCUMENTATION CODES:   Underweight  INTERVENTION:  Provide Nepro Shake po TID, each supplement provides 425 kcal and 19 grams protein.  Encourage adequate PO intake.   NUTRITION DIAGNOSIS:   Increased nutrient needs related to chronic illness (ESRD HD) as evidenced by estimated needs.  GOAL:   Patient will meet greater than or equal to 90% of their needs  MONITOR:   PO intake,Supplement acceptance,Skin,Weight trends,Labs,I & O's  REASON FOR ASSESSMENT:   Malnutrition Screening Tool    ASSESSMENT:   60 y.o. 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 fib-not on anticoagulation, seizure disorder-just discharged from the hospital on 2/8 after a prolonged stay for Covid pneumonia-brought back on 2/9 due to failure to thrive syndrome/severe generalized weakness. Patient was found to have bilateral pleural effusion  Pt unavailable during attempted time of contact. Noted pt with recent discharge from hospital 2/8. Pt readmitted due to as pt unable to care for pt. Plans for SNF on discharge. Meal completion has been 25-45%. RD to order nutritional supplements to aid in caloric and protein needs. Unable to complete Nutrition-Focused physical exam at this time.   Labs and medications reviewed.   Diet Order:   Diet Order            Diet renal with fluid restriction Fluid restriction: 1200 mL Fluid; Room service appropriate? Yes; Fluid consistency: Thin  Diet effective now                 EDUCATION NEEDS:   Not appropriate for education at this time  Skin:  Skin Assessment: Skin Integrity Issues: Skin Integrity Issues:: Stage II Stage II: sacrum  Last BM:  2/9  Height:   Ht Readings from Last 1 Encounters:  10/26/20 '5\' 1"'$  (1.549 m)    Weight:   Wt Readings from Last 1 Encounters:  11/27/20 40.4 kg    Ideal Body Weight:  47.7 kg  BMI:  There is no height or weight on file to  calculate BMI.  Estimated Nutritional Needs:   Kcal:  1550-1750  Protein:  80-95 grams  Fluid:  1L + UOP  Corrin Parker, MS, RD, LDN RD pager number/after hours weekend pager number on Am[employer].

## 2020-11-29 NOTE — Progress Notes (Signed)
Subjective: went home, family couldn't care for her, sent to ED. Readmitted for social reasons.   Objective Vital signs in last 24 hours: Vitals:   11/29/20 0200 11/29/20 0300 11/29/20 0348 11/29/20 1405  BP: (!) 126/56 128/61 (!) 146/56 (!) 127/59  Pulse: 68 69 67 64  Resp: 17 (!) '22 16 20  '$ Temp:   97.9 F (36.6 C) 98 F (36.7 C)  TempSrc:   Axillary Oral  SpO2: 96% 96% 98% 97%   Weight change:   Physical Exam: General: Thin cachectic appearing elderly female Confused about just about everything. Knows she is on dialysis. Doesn't know place, year or date.  Heart: RRR no MRG Lungs: Some decreased breath sounds bases otherwise CTA nonlabored Abdomen: Bowel sounds normoactive, soft NTND Extremities: No pedal edema, L lower arm in cast/ splint Dialysis Access: RUA AVF+bruit Neuro: poor acute memory   OP HD: TTS [facility] 3h 38kg 2/2 bath TDC Hep none - calc 120 ug tiw - mircera 75 q2 last 08/23/20 - velphoro 2 ac tid  - last HD 3hrs on 1/18, mostly compliant. - missed VVS post OP f/u - sp plication on [id number] RUE AVF    Problem/Plan:  1. FTT- family unable to care for at home. SNF application underway today.  2. Recent COVID -19 viral pna - out of isolation 3. ESRD - HD TTS.  HD tomorrow off sched, no urgent indication today.   4. Acute on chronic anemia - GI consulted last admit, conservative measures recommended. Not a/c candidate. 5. Recent L wrist fracture - sp ORIF on 1/27 here  6. Volume -requires midodrine pre HD. No edema on exam. 2kg up by wts.  7. Metabolic bone disease ckd - cont binders and continue calcitriol as outpatient.   Kelly Splinter, MD 11/29/2020, 2:42 PM      Medications:  . amiodarone  200 mg Oral Daily  . feeding supplement (NEPRO CARB STEADY)  237 mL Oral BID BM  . heparin  5,000 Units Subcutaneous Q8H  . levETIRAcetam  500 mg Oral BID  . metoprolol tartrate  25 mg Oral BID  . midodrine  10 mg Oral BID WC  . multivitamin  1  tablet Oral QHS  . pantoprazole  40 mg Oral Daily  . polyethylene glycol  17 g Oral BID  . rosuvastatin  10 mg Oral Daily  . sodium chloride flush  3 mL Intravenous Once

## 2020-11-29 NOTE — ED Notes (Addendum)
This RN spoke with MD Opyd and ask MD if IV team can place IV on left arm above the cast? Per MD " does not see a problem with it"

## 2020-11-29 NOTE — Progress Notes (Signed)
Sent chat message to Dr. Sloan Leiter:  [name] Morning!  The patient, was covid + on 11/08/2020 & does not have airborne or contact isolation orders.    Will you please put the orders in?  Thanks!

## 2020-11-30 ENCOUNTER — Encounter (HOSPITAL_COMMUNITY): Payer: Self-pay | Admitting: Internal Medicine

## 2020-11-30 ENCOUNTER — Other Ambulatory Visit: Payer: Self-pay

## 2020-11-30 NOTE — Progress Notes (Signed)
Subjective: seen in room, being fed by a tech.    Objective Vital signs in last 24 hours: Vitals:   11/29/20 2055 11/29/20 2135 11/30/20 0400 11/30/20 0444  BP: (!) 133/48 (!) 140/56 (!) 116/53   Pulse: (!) 57 (!) 59 61   Resp: (!) '21 18 19   '$ Temp: (!) 96.5 F (35.8 C) (!) 96.5 F (35.8 C) 98.4 F (36.9 C)   TempSrc: Axillary Axillary Axillary   SpO2: 100% 100% 100%   Weight:    47.2 kg   Weight change:   Physical Exam: General: Thin cachectic appearing elderly female. Confused.  Doesn't know place, year or date.  Heart: RRR no MRG Lungs: Some decreased breath sounds bases otherwise CTA nonlabored Abdomen: Bowel sounds normoactive, soft NTND Extremities: No pedal edema, L lower arm in cast/ splint Dialysis Access: RUA AVF+bruit Neuro: poor acute memory   OP HD: TTS [facility] 3h 38kg 2/2 bath TDC Hep none - calc 120 (?) ug tiw - mircera 75 q2 last 08/23/20 - velphoro 2 ac tid  - last HD 3hrs on 1/18, mostly compliant. - missed VVS post OP f/u - sp plication on [id number] RUE AVF    Problem/Plan:  1. FTT- family unable to care for patient at home. SNF is pending, possible dc today per pmd.  2. Recent COVID -19 viral pna - out of isolation 3. ESRD - HD TTS. Cancelled HD for today, does not need it HD today. Labs and vol are good. Will get next HD Sat as outpt, or if still here, as inpatient.  4. Anemia ckd - Hb 12-13 range, no esa needed.  5. L wrist fracture - sp ORIF 1/27  6. Volume -requires midodrine pre HD. No edema on exam.  7. Metabolic bone disease ckd - cont binders and continue calcitriol as outpatient.   Kelly Splinter, MD 11/30/2020, 12:05 PM      Medications:  . amiodarone  200 mg Oral Daily  . Chlorhexidine Gluconate Cloth  6 each Topical Q0600  . feeding supplement (NEPRO CARB STEADY)  237 mL Oral TID BM  . heparin  5,000 Units Subcutaneous Q8H  . levETIRAcetam  500 mg Oral BID  . metoprolol tartrate  25 mg Oral BID  . midodrine  10 mg  Oral BID WC  . multivitamin  1 tablet Oral QHS  . pantoprazole  40 mg Oral Daily  . polyethylene glycol  17 g Oral BID  . rosuvastatin  10 mg Oral Daily  . sodium chloride flush  3 mL Intravenous Once

## 2020-11-30 NOTE — Progress Notes (Signed)
Second call to [facility] [phone] to give report.  Switchboard paged RN.  RN never responded.  Held on the line for 10 minutes.

## 2020-11-30 NOTE — Progress Notes (Signed)
Sent chat message to Dr. Darnell Level:  Patient refused to take medication. She agreed, I administered with applesauce, and she then spit out all over her clean linens.

## 2020-11-30 NOTE — Plan of Care (Signed)
Patient states she does not want to listen to discharge instructions.  She was non-compliant with medications this morning, spitting them out.  Patient is packed and ready for discharge.

## 2020-11-30 NOTE — Progress Notes (Signed)
Patient ID: [name], female   DOB: [birth year], 61 y.o.   MRN: [id number]   LOS: 2 days   Subjective: Pt noncommunicative outside of some head shaking.   Objective: Vital signs in last 24 hours: Temp:  [96.5 F (35.8 C)-98.4 F (36.9 C)] 98.4 F (36.9 C) (02/11 0400) Pulse Rate:  [57-64] 61 (02/11 0400) Resp:  [18-21] 19 (02/11 0400) BP: (116-140)/(48-59) 116/53 (02/11 0400) SpO2:  [97 %-100 %] 100 % (02/11 0400) Weight:  [47.2 kg] 47.2 kg (02/11 0444) Last BM Date: 11/28/20   Laboratory  CBC Recent Labs    11/28/20 1932 11/28/20 2027 11/29/20 0547  WBC 12.8*  --  9.6  HGB 12.1 13.6 13.6  HCT 35.6* 40.0 42.2  PLT 204  --  220   BMET Recent Labs    11/28/20 1932 11/28/20 2027 11/29/20 0547  NA 138 134* 137  K 3.9 5.2* 4.2  CL 98 97* 94*  CO2 24  --  25  GLUCOSE 115* 108* 103*  BUN 29* 44* 32*  CREATININE 5.71* 5.80* 6.15*  CALCIUM 9.3  --  9.7     Physical Exam General appearance: alert and no distress  LUE: Splint removed, incision C/D/I. Could not assess NV fxn as pt not cooperative. 2+ radial pulse.   Assessment/Plan: S/p ORIF left wrist -- D/C sutures, removable wrist splint. Continue NWB wrist, will allow WBAT through elbow. F/u with Dr. Jeannie Fend after discharge.    Lisette Abu, PA-C Orthopedic Surgery [phone] 11/30/2020

## 2020-11-30 NOTE — Discharge Summary (Signed)
PATIENT DETAILS Name: [name] Age: 60 y.o. Sex: female Date of Birth: 01-01-1961 MRN: [id number]. Admitting Physician: Elwyn Reach, MD PCP:No primary care provider on file.  Admit Date: 11/28/2020 Discharge date: 11/30/2020  Recommendations for Outpatient Follow-up:  1. Follow up with PCP in 1-2 weeks 2. Please obtain CMP/CBC in one week 3. Please ensure follow-up with endocrinology, neurosurgery, cardiology, and orthopedics 4. Continue dialysis at the usual schedule.  Admitted From:  Home  Disposition: SNF   Home Health: No  Equipment/Devices: None  Discharge Condition: Stable  CODE STATUS: FULL CODE  Diet recommendation:  Diet Order            Diet - low sodium heart healthy           Diet renal with fluid restriction Fluid restriction: 1200 mL Fluid; Room service appropriate? Yes; Fluid consistency: Thin  Diet effective now                  Brief Narrative: Patient is a 60 y.o. 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 fib-not on anticoagulation, seizure disorder-just discharged from the hospital on 2/8 after a prolonged stay for Covid pneumonia-brought back on 2/9 due to failure to thrive syndrome/severe generalized weakness-as the family was not able to manage her at home (refused SNF previously).  COVID-19 vaccinated status: Unvaccinated  Significant Events: 1/20-2/8>> hospitalized for COVID-19 pneumonia-discharged home with home health services 2/9>> Admit to [facility] for weakness-family unable to care for her-requesting SNF.  Significant studies: 2/9>> CT head: No acute intracranial process 2/9>> chest x-ray: Bilateral pleural effusions/bibasilar atelectasis  COVID-19 medications: Remdesivir: 1/21-1/25  Antibiotics: None  Microbiology data: None  Procedures: None  Consults: Renal  Brief Hospital Course: Failure to thrive/generalized weakness: Family unable to take  care of patient at home-plans are for SNF on discharge.  SNF was recommended last admit-patient's family refused and took her home.  Social worker following.  COVID-19 pneumonia:asymptomatic currently-completed Remdesivir last admit-8 on room air-we will complete 21 days of isolation on 2/10.  ESRD: On HD TTS-nephrology following  Normocytic anemia: Secondary to ESRD-hemoglobin stable-follow periodically.  PAF: Continue metoprolol/amiodarone-not a candidate for anticoagulation given ICH/SAH/anemia  RLE DVT- Age indeterminate:reviewed discharge summary done by Dr. [name] a candidate for anticoagulation due to [place].  IR was consulted for IVC filter-but was not recommended.  Left wrist fracture: S/p ORIF on 1/27-Per DC summary-needs splint/suture removal 2 weeks from 1/27.  Hyperthyroidism: On beta-blocker-apparently methimazole was discontinued last admit-plans are for endocrinology follow-up as an outpatient.  She does not appear to have thyrotoxicosis clinically.  Bilateral SDH: Recently evaluated by neurosurgery-nonoperative management recommended  Seizure disorder: On Keppra  Remote history of history of ICH/SAH-VP shunt in place  Severe failure to thrive syndrome/deconditioning/debility: PT following-awaiting SNF placement  RN pressure injury documentation: Pressure Injury 11/29/20 Sacrum Posterior Stage 2 -  Partial thickness loss of dermis presenting as a shallow open injury with a red, pink wound bed without slough. (Active)  11/29/20 0400  Location: Sacrum  Location Orientation: Posterior  Staging: Stage 2 -  Partial thickness loss of dermis presenting as a shallow open injury with a red, pink wound bed without slough.  Wound Description (Comments):   Present on Admission: Yes    Discharge Instructions:    Person Under Monitoring Name: [name]  Location: [place] [address]   Infection Prevention Recommendations for Individuals Confirmed to  have, or Being Evaluated for,  2019 Novel Coronavirus (COVID-19) Infection Who Receive Care at Home  Individuals who are confirmed to have, or are being evaluated for, COVID-19 should follow the prevention steps below until a healthcare provider or local or state health department says they can return to normal activities.  Stay home except to get medical care You should restrict activities outside your home, except for getting medical care. Do not go to work, school, or public areas, and do not use public transportation or taxis.  Call ahead before visiting your doctor Before your medical appointment, call the healthcare provider and tell them that you have, or are being evaluated for, COVID-19 infection. This will help the healthcare provider's office take steps to keep other people from getting infected. Ask your healthcare provider to call the local or state health department.  Monitor your symptoms Seek prompt medical attention if your illness is worsening (e.g., difficulty breathing). Before going to your medical appointment, call the healthcare provider and tell them that you have, or are being evaluated for, COVID-19 infection. Ask your healthcare provider to call the local or state health department.  Wear a facemask You should wear a facemask that covers your nose and mouth when you are in the same room with other people and when you visit a healthcare provider. People who live with or visit you should also wear a facemask while they are in the same room with you.  Separate yourself from other people in your home As much as possible, you should stay in a different room from other people in your home. Also, you should use a separate bathroom, if available.  Avoid sharing household items You should not share dishes, drinking glasses, cups, eating utensils, towels, bedding, or other items with other people in your home. After using these items, you should wash them thoroughly  with soap and water.  Cover your coughs and sneezes Cover your mouth and nose with a tissue when you cough or sneeze, or you can cough or sneeze into your sleeve. Throw used tissues in a lined trash can, and immediately wash your hands with soap and water for at least 20 seconds or use an alcohol-based hand rub.  Wash your [facility] your hands often and thoroughly with soap and water for at least 20 seconds. You can use an alcohol-based hand sanitizer if soap and water are not available and if your hands are not visibly dirty. Avoid touching your eyes, nose, and mouth with unwashed hands.   Prevention Steps for Caregivers and Household Members of Individuals Confirmed to have, or Being Evaluated for, COVID-19 Infection Being Cared for in the Home  If you live with, or provide care at home for, a person confirmed to have, or being evaluated for, COVID-19 infection please follow these guidelines to prevent infection:  Follow healthcare provider's instructions Make sure that you understand and can help the patient follow any healthcare provider instructions for all care.  Provide for the patient's basic needs You should help the patient with basic needs in the home and provide support for getting groceries, prescriptions, and other personal needs.  Monitor the patient's symptoms If they are getting sicker, call his or her medical provider and tell them that the patient has, or is being evaluated for, COVID-19 infection. This will help the healthcare provider's office take steps to keep other people from getting infected. Ask the healthcare provider to call the local or state health department.  Limit the number of people who have contact with  the patient  If possible, have only one caregiver for the patient.  Other household members should stay in another home or place of residence. If this is not possible, they should stay  in another room, or be separated from the patient as much  as possible. Use a separate bathroom, if available.  Restrict visitors who do not have an essential need to be in the home.  Keep older adults, very young children, and other sick people away from the patient Keep older adults, very young children, and those who have compromised immune systems or chronic health conditions away from the patient. This includes people with chronic heart, lung, or kidney conditions, diabetes, and cancer.  Ensure good ventilation Make sure that shared spaces in the home have good air flow, such as from an air conditioner or an opened window, weather permitting.  Wash your hands often  Wash your hands often and thoroughly with soap and water for at least 20 seconds. You can use an alcohol based hand sanitizer if soap and water are not available and if your hands are not visibly dirty.  Avoid touching your eyes, nose, and mouth with unwashed hands.  Use disposable paper towels to dry your hands. If not available, use dedicated cloth towels and replace them when they become wet.  Wear a facemask and gloves  Wear a disposable facemask at all times in the room and gloves when you touch or have contact with the patient's blood, body fluids, and/or secretions or excretions, such as sweat, saliva, sputum, nasal mucus, vomit, urine, or feces.  Ensure the mask fits over your nose and mouth tightly, and do not touch it during use.  Throw out disposable facemasks and gloves after using them. Do not reuse.  Wash your hands immediately after removing your facemask and gloves.  If your personal clothing becomes contaminated, carefully remove clothing and launder. Wash your hands after handling contaminated clothing.  Place all used disposable facemasks, gloves, and other waste in a lined container before disposing them with other household waste.  Remove gloves and wash your hands immediately after handling these items.  Do not share dishes, glasses, or other household  items with the patient  Avoid sharing household items. You should not share dishes, drinking glasses, cups, eating utensils, towels, bedding, or other items with a patient who is confirmed to have, or being evaluated for, COVID-19 infection.  After the person uses these items, you should wash them thoroughly with soap and water.  Wash laundry thoroughly  Immediately remove and wash clothes or bedding that have blood, body fluids, and/or secretions or excretions, such as sweat, saliva, sputum, nasal mucus, vomit, urine, or feces, on them.  Wear gloves when handling laundry from the patient.  Read and follow directions on labels of laundry or clothing items and detergent. In general, wash and dry with the warmest temperatures recommended on the label.  Clean all areas the individual has used often  Clean all touchable surfaces, such as counters, tabletops, doorknobs, bathroom fixtures, toilets, phones, keyboards, tablets, and bedside tables, every day. Also, clean any surfaces that may have blood, body fluids, and/or secretions or excretions on them.  Wear gloves when cleaning surfaces the patient has come in contact with.  Use a diluted bleach solution (e.g., dilute bleach with 1 part bleach and 10 parts water) or a household disinfectant with a label that says EPA-registered for coronaviruses. To make a bleach solution at home, add 1 tablespoon of bleach to 1  quart (4 cups) of water. For a larger supply, add  cup of bleach to 1 gallon (16 cups) of water.  Read labels of cleaning products and follow recommendations provided on product labels. Labels contain instructions for safe and effective use of the cleaning product including precautions you should take when applying the product, such as wearing gloves or eye protection and making sure you have good ventilation during use of the product.  Remove gloves and wash hands immediately after cleaning.  Monitor yourself for signs and symptoms  of illness Caregivers and household members are considered close contacts, should monitor their health, and will be asked to limit movement outside of the home to the extent possible. Follow the monitoring steps for close contacts listed on the symptom monitoring form.   ? If you have additional questions, contact your local health department or call the epidemiologist on call at [phone] (available 24/7). ? This guidance is subject to change. For the most up-to-date guidance from CDC, please refer to their website: YouBlogs.pl    Activity:  As tolerated with Full fall precautions use walker/cane & assistance as needed Discharge Instructions    Call MD for:  difficulty breathing, headache or visual disturbances   Complete by: As directed    Diet - low sodium heart healthy   Complete by: As directed    Discharge instructions   Complete by: As directed    Follow with Primary MD  No primary care provider on file. in 1-2 weeks  Please get a complete blood count and chemistry panel checked by your Primary MD at your next visit, and again as instructed by your Primary MD.  Get Medicines reviewed and adjusted: Please take all your medications with you for your next visit with your Primary MD  Laboratory/radiological data: Please request your Primary MD to go over all hospital tests and procedure/radiological results at the follow up, please ask your Primary MD to get all Hospital records sent to his/her office.  In some cases, they will be blood work, cultures and biopsy results pending at the time of your discharge. Please request that your primary care M.D. follows up on these results.  Also Note the following: If you experience worsening of your admission symptoms, develop shortness of breath, life threatening emergency, suicidal or homicidal thoughts you must seek medical attention immediately by calling 911 or calling  your MD immediately  if symptoms less severe.  You must read complete instructions/literature along with all the possible adverse reactions/side effects for all the Medicines you take and that have been prescribed to you. Take any new Medicines after you have completely understood and accpet all the possible adverse reactions/side effects.   Do not drive when taking Pain medications or sleeping medications (Benzodaizepines)  Do not take more than prescribed Pain, Sleep and Anxiety Medications. It is not advisable to combine anxiety,sleep and pain medications without talking with your primary care practitioner  Special Instructions: If you have smoked or chewed Tobacco  in the last 2 yrs please stop smoking, stop any regular Alcohol  and or any Recreational drug use.  Wear Seat belts while driving.  Please note: You were cared for by a hospitalist during your hospital stay. Once you are discharged, your primary care physician will handle any further medical issues. Please note that NO REFILLS for any discharge medications will be authorized once you are discharged, as it is imperative that you return to your primary care physician (or establish a relationship with a  primary care physician if you do not have one) for your post hospital discharge needs so that they can reassess your need for medications and monitor your lab values.   Continue NWB wrist, will allow WBAT through elbow   Discharge wound care:   Complete by: As directed    Foam dressing to sacrum, change Q 3 days or PRN soiling   Increase activity slowly   Complete by: As directed      Allergies as of 11/30/2020      Reactions   Amoxicillin Hives, Rash   Informed by pt's daughter Larquita Bolton    Penicillins Hives, Rash   Has patient had a PCN reaction causing immediate rash, facial/tongue/throat swelling, SOB or lightheadedness with hypotension: Yes Has patient had a PCN reaction causing severe rash involving mucus membranes  or skin necrosis: No Has patient had a PCN reaction that required hospitalization: No Has patient had a PCN reaction occurring within the last 10 years: No If all of the above answers are "NO", then may proceed with Cephalosporin use. PATIENT TOLERATED CEFAZOLIN IN OR 11/15/20      Medication List    TAKE these medications   amiodarone 200 MG tablet Commonly known as: PACERONE Take 1 tablet (200 mg total) by mouth daily.   aspirin 81 MG EC tablet Take 81 mg by mouth every 8 (eight) hours as needed for pain.   calcitRIOL 0.5 MCG capsule Commonly known as: ROCALTROL Take 1 capsule (0.5 mcg total) by mouth Every Tuesday,Thursday,and Saturday with dialysis.   calcium acetate 667 MG capsule Commonly known as: PHOSLO Take 667 mg by mouth 3 (three) times daily.   feeding supplement (NEPRO CARB STEADY) Liqd Take 237 mLs by mouth 3 (three) times daily between meals.   levETIRAcetam 500 MG tablet Commonly known as: KEPPRA Take 1 tablet (500 mg total) by mouth 2 (two) times daily.   metoprolol tartrate 25 MG tablet Commonly known as: LOPRESSOR Take 1 tablet (25 mg total) by mouth 2 (two) times daily.   midodrine 10 MG tablet Commonly known as: PROAMATINE Take 1 tablet (10 mg total) by mouth 2 (two) times daily with a meal.   multivitamin Tabs tablet Take 1 tablet by mouth at bedtime.   nitroGLYCERIN 0.4 MG SL tablet Commonly known as: NITROSTAT Place 1 tablet (0.4 mg total) under the tongue every 5 (five) minutes as needed for chest pain.   pantoprazole 40 MG tablet Commonly known as: PROTONIX Take 1 tablet (40 mg total) by mouth daily.   polyethylene glycol 17 g packet Commonly known as: MIRALAX / GLYCOLAX Take 17 g by mouth 2 (two) times daily.   rosuvastatin 10 MG tablet Commonly known as: CRESTOR Take 10 mg by mouth daily.            Discharge Care Instructions  (From admission, onward)         Start     Ordered   11/30/20 0000  Discharge wound care:        Comments: Foam dressing to sacrum, change Q 3 days or PRN soiling   11/30/20 1044          Contact information for follow-up providers    Buford Dresser, MD. Schedule an appointment as soon as possible for a visit in 1 week(s).   Specialty: Cardiology Contact information: [address] [place]  [address] [phone]            Contact information for after-discharge care  Destination    [facility] .   Service: Skilled Nursing Contact information: [place] [phone]                 Allergies  Allergen Reactions  . Amoxicillin Hives and Rash    Informed by pt's daughter [name]   . Penicillins Hives and Rash    Has patient had a PCN reaction causing immediate rash, facial/tongue/throat swelling, SOB or lightheadedness with hypotension: Yes Has patient had a PCN reaction causing severe rash involving mucus membranes or skin necrosis: No Has patient had a PCN reaction that required hospitalization: No Has patient had a PCN reaction occurring within the last 10 years: No If all of the above answers are "NO", then may proceed with Cephalosporin use. PATIENT TOLERATED CEFAZOLIN IN OR 11/15/20     Other Procedures/Studies: CT ABDOMEN PELVIS WO CONTRAST  Result Date: 11/09/2020 CLINICAL DATA:  Acute generalized abdominal pain. EXAM: CT ABDOMEN AND PELVIS WITHOUT CONTRAST TECHNIQUE: Multidetector CT imaging of the abdomen and pelvis was performed following the standard protocol without IV contrast. COMPARISON:  November 08, 2020. FINDINGS: Lower chest: Increased left basilar opacity is noted concerning for worsening pneumonia or atelectasis. Mild right posterior basilar subsegmental atelectasis is noted. Hepatobiliary: No gallstones or biliary dilatation is noted. Multiple rounded hepatic low densities are noted most consistent with cysts. Pancreas: No definite evidence of acute  inflammation is noted. Stable mild nonspecific ductal dilatation is noted. Spleen: Normal in size without focal abnormality. Adrenals/Urinary Tract: Adrenal glands appear normal. Bilateral renal atrophy is noted consistent with end-stage renal disease. Left renal cysts are noted. No hydronephrosis or renal obstruction is noted. Urinary bladder is unremarkable. Stomach/Bowel: Stomach is within normal limits. Appendix appears normal. Large amount of stool is again noted in the rectum concerning for impaction. No abnormal bowel dilatation is otherwise noted. Vascular/Lymphatic: Aortic atherosclerosis. No enlarged abdominal or pelvic lymph nodes. Reproductive: Status post hysterectomy. No adnexal masses. Other: Ventriculoperitoneal shunt is noted with tip in left side of abdomen. No hernia or ascites is noted. Musculoskeletal: No acute or significant osseous findings. IMPRESSION: 1. Increased left basilar opacity is noted concerning for worsening pneumonia or atelectasis. 2. Bilateral renal atrophy is noted consistent with end-stage renal disease. 3. Aortic atherosclerosis. 4. Large amount of stool is again noted in the rectum concerning for impaction. Aortic Atherosclerosis (ICD10-I70.0). Electronically Signed   By: Marijo Conception M.D.   On: 11/09/2020 15:05   DG Chest 1 View  Result Date: 11/26/2020 CLINICAL DATA:  Right thoracentesis EXAM: CHEST  1 VIEW COMPARISON:  11/26/2020 FINDINGS: Single frontal view of the chest demonstrates decreased right pleural effusion after recent thoracentesis, with small residual right pleural effusion. Increased aeration at the right lung base, with moderate residual atelectasis or airspace disease. Stable left basilar consolidation and effusion. No evidence of pneumothorax. Stable catheter in the left supraclavicular region, right-sided ventriculostomy catheter, and right-sided dialysis catheter. Loop recorder within the left anterior chest unchanged. Cardiac silhouette is  stable. IMPRESSION: 1. Decreased right pleural effusion after thoracentesis. No evidence of complication. 2. Improving aeration at the right lung base, with moderate residual consolidation and/or atelectasis. 3. Stable left basilar consolidation and effusion. Electronically Signed   By: Randa Ngo M.D.   On: 11/26/2020 15:30   DG Chest 2 View  Result Date: 11/08/2020 CLINICAL DATA:  Nausea x1 day. EXAM: CHEST - 2 VIEW COMPARISON:  October 15, 2020 FINDINGS: The lateral views limited in evaluation secondary  to positioning of the patient's upper extremities. There is stable right-sided venous catheter positioning. Tubing from a ventriculoperitoneal shunt is also seen. Mild atelectasis and/or infiltrate is seen within the retrocardiac region of the left lung base. There is no evidence of a pleural effusion or pneumothorax. The cardiac silhouette is mildly enlarged. Marked severity calcification of the thoracic aorta is seen. The visualized skeletal structures are unremarkable. IMPRESSION: Mild left basilar atelectasis and/or infiltrate. Electronically Signed   By: Virgina Norfolk M.D.   On: 11/08/2020 19:52   DG Wrist 2 Views Left  Result Date: 11/09/2020 CLINICAL DATA:  Fall EXAM: LEFT WRIST - 2 VIEW COMPARISON:  None. FINDINGS: There is a comminuted impacted distal radius fracture with slight posterior angulation. There is a non displaced ulnar styloid fracture. Overlying soft tissue swelling is seen. IMPRESSION: Comminuted impacted distal radius fracture. Nondisplaced ulnar styloid fracture. Electronically Signed   By: Prudencio Pair M.D.   On: 11/09/2020 02:22   CT HEAD WO CONTRAST  Result Date: 11/28/2020 CLINICAL DATA:  Altered level of consciousness, lethargic EXAM: CT HEAD WITHOUT CONTRAST TECHNIQUE: Contiguous axial images were obtained from the base of the skull through the vertex without intravenous contrast. COMPARISON:  11/19/2020 FINDINGS: Brain: Chronic ischemic changes are seen within  the left cerebellar hemisphere, left parietooccipital lobe, and bilateral periventricular white matter. No signs of acute infarct or hemorrhage. Lateral ventricles and midline structures are stable. Right frontal ventriculostomy catheter unchanged. No acute extra-axial fluid collections. No mass effect. 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 No hyperdense vessel. Stable atherosclerosis. Skull: Stable postsurgical changes from right frontal craniotomy. No acute bony abnormalities. Sinuses/Orbits: No acute finding. Other: None. IMPRESSION: 1. No acute intracranial process. 2. Chronic ischemic changes throughout the white matter, left parietooccipital lobe, and left cerebellar hemisphere, stable. 3. Stable postsurgical changes as above. Electronically Signed   By: Randa Ngo M.D.   On: 11/28/2020 21:14   CT HEAD WO CONTRAST  Result Date: 11/19/2020 CLINICAL DATA:  Subdural hematoma, shunted hydrocephalus, follow-up EXAM: CT HEAD WITHOUT CONTRAST TECHNIQUE: Contiguous axial images were obtained from the base of the skull through the vertex without intravenous contrast. COMPARISON:  11/17/2020 FINDINGS: Brain: Thin left cerebral convexity hypodense subdural collection is stable in size. Trace right cerebral convexity hypodense subdural collection is also unchanged. No significant mass effect. Ventricles are stable in caliber. Right frontal approach shunt catheter is in stable position. 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 Additional patchy and confluent areas of hypoattenuation likely reflecting stable chronic microvascular ischemic changes. There is gliosis along the catheter tract. No new intracranial hemorrhage. No new loss of gray-white differentiation. 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 Coil packing at  the basilar tip with associated streak artifact.There is atherosclerotic calcification at the skull base. Skull: Right craniotomy and burr hole. Sinuses/Orbits: No acute finding. Other: None. IMPRESSION: No substantial change in thin cerebral convexity subdural collections. No significant mass effect. Stable ventricle caliber and positioning of shunt catheter. Electronically Signed   By: Macy Mis M.D.   On: 11/19/2020 09:30   CT HEAD WO CONTRAST  Result Date: 11/17/2020 CLINICAL DATA:  60 year old female with acute mental status changes EXAM: CT HEAD WITHOUT CONTRAST TECHNIQUE: Contiguous axial images were obtained from the base of the skull through the vertex without intravenous contrast. COMPARISON:  10/06/2020, 02/16/2018, 11/11/2013 FINDINGS: Brain: New bilateral frontal parietal  extra-axial fluid collections, slightly larger on the left, intermediate density. On the left, fluid extends over the posterior temporal region. The greatest depth measured on the left from the inner table to the cortex is approximately 7 mm. On the right the greatest depth estimated is 4 mm. Slight local mass effect of the underlying brain parenchyma. Confluent hypodensity in the left posterior sylvian/parietal region, demonstrated on sequential CT studies dating to 2015. Internal density is somewhat higher than expected for chronic infarction, without contraction. Right frontal approach ventriculostomy again terminating left of midline at the third ventricle. Margin of hypodensity along the tract is again demonstrated with no acute hemorrhage. Focal hypodensity in the left cerebellar hemisphere, unchanged. Coil mass with associated artifact at the basilar tip. Surgical clips of right MCA aneurysms, with associated artifact. Subcortical hypodensity in the left frontal and parietal region, unchanged from prior. No acute intracranial hemorrhage.  No midline shift. Vascular: Atherosclerotic calcifications. Surgical clips of  right MCA aneurysm, paraclinoid and at the M 2 distribution. Coil mass at the basilar tip again demonstrated. Skull: Surgical changes of prior right pterional craniotomy. Surgical changes of right frontal approach ventriculostomy with the tip of the shunt drain terminating left of midline in the third ventricle. Sinuses/Orbits: Unremarkable paranasal sinuses. Other: None IMPRESSION: New bilateral frontoparietal subdural collection, mixed density with mild mass effect of the underlying brain. The left a slightly thicker measuring 7 mm, and also extends inferiorly over the temporal lobe. Right-sided collection measures 4 mm. The relatively hypodense region of the left parietal lobe is more dense than expected for an infarction that occurred in 2015, without the expected contraction/evolution of typical infarction appearance. A low grade glioma should be considered, alternatively local infection and further evaluation with contrast MRI is recommended. Redemonstration of surgical changes of prior right MCA aneurysm clipping x2, basilar tip aneurysm coiling, right frontal approach ventriculostomy drain, as above. Electronically Signed   By: Corrie Mckusick D.O.   On: 11/17/2020 14:44   CT ABDOMEN PELVIS W CONTRAST  Result Date: 11/08/2020 CLINICAL DATA:  Epigastric pain EXAM: CT ABDOMEN AND PELVIS WITH CONTRAST TECHNIQUE: Multidetector CT imaging of the abdomen and pelvis was performed using the standard protocol following bolus administration of intravenous contrast. CONTRAST:  73m OMNIPAQUE IOHEXOL 300 MG/ML  SOLN COMPARISON:  None. FINDINGS: Lower chest: The visualized heart size within normal limits. No pericardial fluid/thickening. No hiatal hernia. A small left pleural effusion is seen. Patchy airspace opacity seen at the left lung base. Hepatobiliary: Multiple low-density lesions are seen throughout the liver parenchyma the largest measuring 1.2 cm in the superior right liver lobe.The main portal vein is  patent. No evidence of calcified gallstones, gallbladder wall thickening or biliary dilatation. Pancreas: There is mild pancreatic ductal dilatation measuring up to 2 mm. This is nonspecific however. Spleen: Normal in size without focal abnormality. Adrenals/Urinary Tract: Both adrenal glands appear normal. Mild bilateral renal atrophy seen. There is scattered calcification the largest measuring 3 mm in the midpole of the right kidney. No hydronephrosis. Bladder is unremarkable. Stomach/Bowel: The stomach, small bowel, are normal in appearance. There is scattered colonic diverticula. There is a moderately distended rectal fecal ball. Vascular/Lymphatic: There are no enlarged mesenteric, retroperitoneal, or pelvic lymph nodes. Scattered aortic atherosclerotic calcifications are seen without aneurysmal dilatation. Reproductive: The patient is status post hysterectomy. No adnexal masses or collections seen. Other: No evidence of abdominal wall mass or hernia. Musculoskeletal: No acute or significant osseous findings. IMPRESSION: Small left pleural effusion with patchy airspace opacity  at the left lung base which may be due to atelectasis and/or infectious etiology Nonspecific mild pancreatic ductal dilatation Diverticulosis without diverticulitis Moderately dilated rectum with a fecal ball Aortic Atherosclerosis (ICD10-I70.0). Electronically Signed   By: Prudencio Pair M.D.   On: 11/08/2020 20:34   DG Chest Portable 1 View  Result Date: 11/28/2020 CLINICAL DATA:  COVID infection 3 weeks ago.  Worsening weakness. EXAM: PORTABLE CHEST 1 VIEW COMPARISON:  11/26/2020 FINDINGS: Medium-sized pleural effusions with basilar atelectasis. Mild cardiomegaly. Right IJ approach central venous catheter tip is at the cavoatrial junction. Partially visualized shunt catheter. IMPRESSION: Worsened medium-sized bilateral pleural effusions and basilar atelectasis. Electronically Signed   By: Ulyses Jarred M.D.   On: 11/28/2020 20:37    DG CHEST PORT 1 VIEW  Result Date: 11/26/2020 CLINICAL DATA:  Tachypnea EXAM: PORTABLE CHEST 1 VIEW COMPARISON:  November 25, 2020 FINDINGS: Central catheter tip is at the cavoatrial junction, stable. A shunt extends along the right hemithorax. Loop recorder again noted on the left. No pneumothorax. There is moderate partially loculated pleural effusion on the right with smaller pleural effusion on the left. There is airspace opacity in each lower lung region and right mid lung region, stable. Heart is mildly enlarged with pulmonary venous hypertension, stable. No adenopathy. There is aortic atherosclerosis. No bone lesions. IMPRESSION: Stable catheter positions. No pneumothorax. Pleural effusions bilaterally, larger on the right than the left. Areas of airspace opacity in the right mid lung and bibasilar regions, stable. Stable cardiac silhouette. Aortic Atherosclerosis (ICD10-I70.0). Electronically Signed   By: Lowella Grip III M.D.   On: 11/26/2020 08:03   DG CHEST PORT 1 VIEW  Result Date: 11/25/2020 CLINICAL DATA:  60 year old female with hypoxia. EXAM: PORTABLE CHEST 1 VIEW COMPARISON:  Portable chest 11/21/2020 and earlier. FINDINGS: Portable AP semi upright view at 0736 hours. Stable right chest dual lumen dialysis type catheter. Left chest cardiac loop recorder or external defibrillator and right neck and chest shunt catheter are stable. Calcified aortic atherosclerosis. Decreased but not resolved veiling opacity in the left lower lung. Continued dense lower lobe opacity. Moderate Veiling opacity not significantly changed in the right lung. No pneumothorax. Upper lung pulmonary vascularity appears normalized. No air bronchograms. Stable cardiac size and mediastinal contours. Visualized tracheal air column is within normal limits. No acute osseous abnormality identified. IMPRESSION: 1. Regressed pulmonary edema since 11/21/2020. Moderate right and small left pleural effusions persist with lower  lobe collapse or consolidation. 2. No new cardiopulmonary abnormality. Electronically Signed   By: Genevie Ann M.D.   On: 11/25/2020 09:15   DG Chest Port 1 View  Result Date: 11/22/2020 CLINICAL DATA:  Acute respiratory distress.  Rapid response. EXAM: PORTABLE CHEST 1 VIEW COMPARISON:  Radiograph 11/10/2020 FINDINGS: Dual lead right-sided dialysis catheter remains in place. Implanted limited loop recorder projects over the left chest wall. Presumed shunt catheter tubing projects over the right hemithorax. Small bore catheter projects over the supraclavicular soft tissues on the left. Progressive bilateral pleural effusions from prior exam, right greater than left. Development of pulmonary edema. Cardiomegaly is grossly similar. Dense aortic atherosclerosis. No pneumothorax. IMPRESSION: 1. Bilateral pleural effusions, moderately large on the right and small to moderate on the left. 2. Progressive pulmonary edema. 3. Chronic cardiomegaly.  Aortic Atherosclerosis (ICD10-I70.0). Electronically Signed   By: Keith Rake M.D.   On: 11/22/2020 00:01   DG Chest Port 1 View  Result Date: 11/09/2020 CLINICAL DATA:  Shortness of breath EXAM: PORTABLE CHEST 1 VIEW COMPARISON:  November 08, 2020 FINDINGS: Central catheter tip is at the cavoatrial junction. There is a intraperitoneal shunt catheter extending along the medial right hemithorax, stable. There is ill-defined airspace opacity in the left lower lobe. Lungs elsewhere clear. Heart is mildly enlarged with pulmonary venous hypertension. There is aortic atherosclerosis. No adenopathy. There is a loop recorder on the left. No bone lesions. IMPRESSION: Central catheter position unchanged. No pneumothorax. Airspace opacity likely representing pneumonia left lower lobe. Lungs elsewhere clear. There is mild cardiomegaly with a degree of pulmonary vascular congestion. Aortic Atherosclerosis (ICD10-I70.0). Electronically Signed   By: Lowella Grip III M.D.   On:  11/09/2020 08:37   DG Abd Portable 1V  Result Date: 11/17/2020 CLINICAL DATA:  Constipation and abdominal distension EXAM: PORTABLE ABDOMEN - 1 VIEW COMPARISON:  Abdominal CT from 8 days ago FINDINGS: The bowel gas pattern is normal. No abnormal stool retention. Peritoneal catheter which loops in the pelvis. Extensive atheromatous calcification. Opacified retrocardiac lung with evidence of small right pleural effusion, unchanged from preceding CT. IMPRESSION: Normal bowel gas pattern.  No abnormal stool retention. Electronically Signed   By: Monte Fantasia M.D.   On: 11/17/2020 10:33   DG Abd Portable 1V  Result Date: 11/09/2020 CLINICAL DATA:  Abdominal pain, anemia EXAM: PORTABLE ABDOMEN - 1 VIEW COMPARISON:  11/08/2020 CT abdomen/pelvis FINDINGS: No disproportionately dilated small bowel loops. Moderate rectal stool, similar. Mild to moderate colonic stool and gas. No evidence of pneumatosis or pneumoperitoneum. Right-sided VP shunt catheter terminates in the left abdomen and appears intact and without kink. No radiopaque nephrolithiasis. IMPRESSION: Nonobstructive bowel gas pattern. Moderate rectal stool, similar to recent CT. Mild to moderate colonic stool and gas. Electronically Signed   By: Ilona Sorrel M.D.   On: 11/09/2020 10:11   DG MINI C-ARM IMAGE ONLY  Result Date: 11/15/2020 There is no interpretation for this exam.  This order is for images obtained during a surgical procedure.  Please See "Surgeries" Tab for more information regarding the procedure.   VAS [place] LOWER EXTREMITY VENOUS (DVT)  Result Date: 11/17/2020  Lower Venous DVT Study Indications: Covid-19, Age indeterminate DVT found in the right common femoral vein 11/14/20, unable to receive anticoagulants secondary to [place].  Limitations: Pain with compression. Comparison Study: Prior study done 11/14/20 is available for comparison Performing Technologist: Sharion Dove RVS  Examination Guidelines: A complete evaluation includes  B-mode imaging, spectral Doppler, color Doppler, and power Doppler as needed of all accessible portions of each vessel. Bilateral testing is considered an integral part of a complete examination. Limited examinations for reoccurring indications may be performed as noted. The reflux portion of the exam is performed with the patient in reverse Trendelenburg.  +---------+---------------+---------+-----------+----------+-------------------+ RIGHT    CompressibilityPhasicitySpontaneityPropertiesThrombus Aging      +---------+---------------+---------+-----------+----------+-------------------+ CFV      Full                                         pulsatile flow, age                                                       indeterminate       +---------+---------------+---------+-----------+----------+-------------------+ SFJ      Full                                                             +---------+---------------+---------+-----------+----------+-------------------+  FV Prox                                               patent by color and                                                       Doppler             +---------+---------------+---------+-----------+----------+-------------------+ FV Mid                                                patent by color and                                                       Doppler             +---------+---------------+---------+-----------+----------+-------------------+ FV Distal                                             patent by color and                                                       Doppler             +---------+---------------+---------+-----------+----------+-------------------+ POP                                                   pulsatile flow,                                                           patent by color and                                                        Doppler             +---------+---------------+---------+-----------+----------+-------------------+ PTV      Full                                                             +---------+---------------+---------+-----------+----------+-------------------+  PERO     Full                                                             +---------+---------------+---------+-----------+----------+-------------------+ small, age indeterminate thrombus attached to vein wall  +---------+---------------+---------+-----------+----------+--------------+ LEFT     CompressibilityPhasicitySpontaneityPropertiesThrombus Aging +---------+---------------+---------+-----------+----------+--------------+ CFV      Full                                         pulsatile flow +---------+---------------+---------+-----------+----------+--------------+ SFJ      Full                                                        +---------+---------------+---------+-----------+----------+--------------+ FV Prox  Full                                                        +---------+---------------+---------+-----------+----------+--------------+ FV Mid   Full                                                        +---------+---------------+---------+-----------+----------+--------------+ FV DistalFull                                                        +---------+---------------+---------+-----------+----------+--------------+ PFV      Full                                                        +---------+---------------+---------+-----------+----------+--------------+ POP      Full                                         pulsatile flow +---------+---------------+---------+-----------+----------+--------------+ PTV      Full                                                        +---------+---------------+---------+-----------+----------+--------------+     Summary:  RIGHT: - Findings consistent with age indeterminate deep vein thrombosis involving the right common femoral vein. - Findings appear improved from previous examination.  LEFT: - No evidence of deep vein thrombosis in the lower extremity. No indirect evidence of obstruction  proximal to the inguinal ligament.  *See table(s) above for measurements and observations. Electronically signed by Jamelle Haring on 11/17/2020 at 2:46:36 PM.    Final    VAS [place] LOWER EXTREMITY VENOUS (DVT)  Result Date: 11/14/2020  Lower Venous DVT Study Indications: D-dimer, Covid-19.  Comparison Study: No prior studies. Performing Technologist: Darlin Coco RDMS  Examination Guidelines: A complete evaluation includes B-mode imaging, spectral Doppler, color Doppler, and power Doppler as needed of all accessible portions of each vessel. Bilateral testing is considered an integral part of a complete examination. Limited examinations for reoccurring indications may be performed as noted. The reflux portion of the exam is performed with the patient in reverse Trendelenburg.  +---------+---------------+---------+-----------+----------+-----------------+ RIGHT    CompressibilityPhasicitySpontaneityPropertiesThrombus Aging    +---------+---------------+---------+-----------+----------+-----------------+ CFV      Partial        No       Yes                  Age Indeterminate +---------+---------------+---------+-----------+----------+-----------------+ SFJ      Full           No       Yes                                    +---------+---------------+---------+-----------+----------+-----------------+ FV Prox                 No       Yes                                    +---------+---------------+---------+-----------+----------+-----------------+ FV Mid                  No       Yes                                    +---------+---------------+---------+-----------+----------+-----------------+ FV Distal                No       Yes                                    +---------+---------------+---------+-----------+----------+-----------------+ PFV                     No       Yes                                    +---------+---------------+---------+-----------+----------+-----------------+ POP                     No       Yes                                    +---------+---------------+---------+-----------+----------+-----------------+ PTV                     No       Yes                                    +---------+---------------+---------+-----------+----------+-----------------+   +---------+---------------+---------+-----------+----------+--------------+  LEFT     CompressibilityPhasicitySpontaneityPropertiesThrombus Aging +---------+---------------+---------+-----------+----------+--------------+ CFV      Full           No       Yes                                 +---------+---------------+---------+-----------+----------+--------------+ SFJ      Full                                                        +---------+---------------+---------+-----------+----------+--------------+ FV Prox  Full                                                        +---------+---------------+---------+-----------+----------+--------------+ FV Mid   Full                                                        +---------+---------------+---------+-----------+----------+--------------+ FV DistalFull                                                        +---------+---------------+---------+-----------+----------+--------------+ PFV      Full           No       Yes                                 +---------+---------------+---------+-----------+----------+--------------+ POP      Full           No       Yes                                 +---------+---------------+---------+-----------+----------+--------------+ PTV      Full                                                         +---------+---------------+---------+-----------+----------+--------------+ PERO     Full                                                        +---------+---------------+---------+-----------+----------+--------------+     Summary: RIGHT: - Findings consistent with a partial, non-obstructing age indeterminate deep vein thrombosis involving the right common femoral vein. - No cystic structure found in the popliteal fossa.  LEFT: - There is no evidence of deep vein thrombosis in the lower extremity.  -  No cystic structure found in the popliteal fossa.  *See table(s) above for measurements and observations. Electronically signed by Harold Barban MD on 11/14/2020 at 9:38:42 PM.    Final    [place] EKG SITE RITE  Result Date: 11/09/2020 If Site Rite image not attached, placement could not be confirmed due to current cardiac rhythm.  IR THORACENTESIS ASP PLEURAL SPACE W/IMG GUIDE  Result Date: 11/26/2020 INDICATION: Patient with history of COVID-19 pneumonia, ESRD on HD, atrial fibrillation, dyspnea, and bilateral pleural effusions, R>L. Request is made for therapeutic right thoracentesis. EXAM: ULTRASOUND GUIDED THERAPEUTIC RIGHT THORACENTESIS MEDICATIONS: 7 mL 1% lidocaine COMPLICATIONS: None immediate. PROCEDURE: An ultrasound guided thoracentesis was thoroughly discussed with the patient and questions answered. The benefits, risks, alternatives and complications were also discussed. The patient understands and wishes to proceed with the procedure. Written consent was obtained. Ultrasound was performed to localize and mark an adequate pocket of fluid in the right chest. The area was then prepped and draped in the normal sterile fashion. 1% Lidocaine was used for local anesthesia. Under ultrasound guidance a 6 Fr Safe-T-Centesis catheter was introduced. Thoracentesis was performed. The catheter was removed and a dressing applied. FINDINGS: A total of approximately 1 L of hazy  gold fluid was removed. Procedure was stopped after 1 L per patient request due to patient's symptom (chest pain). IMPRESSION: Successful ultrasound guided right thoracentesis yielding 1 L of pleural fluid. Read by: Earley Abide, PA-C Electronically Signed   By: Corrie Mckusick D.O.   On: 11/26/2020 15:26     TODAY-DAY OF DISCHARGE:  Subjective:   [name] today has no headache,no chest abdominal pain,no new weakness tingling or numbness, feels much better wants to go home today.   Objective:   Blood pressure (!) 116/53, pulse 61, temperature 98.4 F (36.9 C), temperature source Axillary, resp. rate 19, weight 47.2 kg, SpO2 100 %.  Intake/Output Summary (Last 24 hours) at 11/30/2020 1057 Last data filed at 11/30/2020 0844 Gross per 24 hour  Intake 240 ml  Output --  Net 240 ml   Filed Weights   11/30/20 0444  Weight: 47.2 kg    Exam: Awake Alert, Oriented *3, No new F.N deficits, Normal affect [place].AT,PERRAL Supple Neck,No JVD, No cervical lymphadenopathy appriciated.  Symmetrical Chest wall movement, Good air movement bilaterally, CTAB RRR,No Gallops,Rubs or new Murmurs, No Parasternal Heave +ve B.Sounds, Abd Soft, Non tender, No organomegaly appriciated, No rebound -guarding or rigidity. No Cyanosis, Clubbing or edema, No new Rash or bruise   PERTINENT RADIOLOGIC STUDIES: CT ABDOMEN PELVIS WO CONTRAST  Result Date: 11/09/2020 CLINICAL DATA:  Acute generalized abdominal pain. EXAM: CT ABDOMEN AND PELVIS WITHOUT CONTRAST TECHNIQUE: Multidetector CT imaging of the abdomen and pelvis was performed following the standard protocol without IV contrast. COMPARISON:  November 08, 2020. FINDINGS: Lower chest: Increased left basilar opacity is noted concerning for worsening pneumonia or atelectasis. Mild right posterior basilar subsegmental atelectasis is noted. Hepatobiliary: No gallstones or biliary dilatation is noted. Multiple rounded hepatic low densities are noted most consistent  with cysts. Pancreas: No definite evidence of acute inflammation is noted. Stable mild nonspecific ductal dilatation is noted. Spleen: Normal in size without focal abnormality. Adrenals/Urinary Tract: Adrenal glands appear normal. Bilateral renal atrophy is noted consistent with end-stage renal disease. Left renal cysts are noted. No hydronephrosis or renal obstruction is noted. Urinary bladder is unremarkable. Stomach/Bowel: Stomach is within normal limits. Appendix appears normal. Large amount of stool is again noted in the rectum concerning for impaction.  No abnormal bowel dilatation is otherwise noted. Vascular/Lymphatic: Aortic atherosclerosis. No enlarged abdominal or pelvic lymph nodes. Reproductive: Status post hysterectomy. No adnexal masses. Other: Ventriculoperitoneal shunt is noted with tip in left side of abdomen. No hernia or ascites is noted. Musculoskeletal: No acute or significant osseous findings. IMPRESSION: 1. Increased left basilar opacity is noted concerning for worsening pneumonia or atelectasis. 2. Bilateral renal atrophy is noted consistent with end-stage renal disease. 3. Aortic atherosclerosis. 4. Large amount of stool is again noted in the rectum concerning for impaction. Aortic Atherosclerosis (ICD10-I70.0). Electronically Signed   By: Marijo Conception M.D.   On: 11/09/2020 15:05   DG Chest 1 View  Result Date: 11/26/2020 CLINICAL DATA:  Right thoracentesis EXAM: CHEST  1 VIEW COMPARISON:  11/26/2020 FINDINGS: Single frontal view of the chest demonstrates decreased right pleural effusion after recent thoracentesis, with small residual right pleural effusion. Increased aeration at the right lung base, with moderate residual atelectasis or airspace disease. Stable left basilar consolidation and effusion. No evidence of pneumothorax. Stable catheter in the left supraclavicular region, right-sided ventriculostomy catheter, and right-sided dialysis catheter. Loop recorder within the left  anterior chest unchanged. Cardiac silhouette is stable. IMPRESSION: 1. Decreased right pleural effusion after thoracentesis. No evidence of complication. 2. Improving aeration at the right lung base, with moderate residual consolidation and/or atelectasis. 3. Stable left basilar consolidation and effusion. Electronically Signed   By: Randa Ngo M.D.   On: 11/26/2020 15:30   DG Chest 2 View  Result Date: 11/08/2020 CLINICAL DATA:  Nausea x1 day. EXAM: CHEST - 2 VIEW COMPARISON:  October 15, 2020 FINDINGS: The lateral views limited in evaluation secondary to positioning of the patient's upper extremities. There is stable right-sided venous catheter positioning. Tubing from a ventriculoperitoneal shunt is also seen. Mild atelectasis and/or infiltrate is seen within the retrocardiac region of the left lung base. There is no evidence of a pleural effusion or pneumothorax. The cardiac silhouette is mildly enlarged. Marked severity calcification of the thoracic aorta is seen. The visualized skeletal structures are unremarkable. IMPRESSION: Mild left basilar atelectasis and/or infiltrate. Electronically Signed   By: Virgina Norfolk M.D.   On: 11/08/2020 19:52   DG Wrist 2 Views Left  Result Date: 11/09/2020 CLINICAL DATA:  Fall EXAM: LEFT WRIST - 2 VIEW COMPARISON:  None. FINDINGS: There is a comminuted impacted distal radius fracture with slight posterior angulation. There is a non displaced ulnar styloid fracture. Overlying soft tissue swelling is seen. IMPRESSION: Comminuted impacted distal radius fracture. Nondisplaced ulnar styloid fracture. Electronically Signed   By: Prudencio Pair M.D.   On: 11/09/2020 02:22   CT HEAD WO CONTRAST  Result Date: 11/28/2020 CLINICAL DATA:  Altered level of consciousness, lethargic EXAM: CT HEAD WITHOUT CONTRAST TECHNIQUE: Contiguous axial images were obtained from the base of the skull through the vertex without intravenous contrast. COMPARISON:  11/19/2020 FINDINGS:  Brain: Chronic ischemic changes are seen within the left cerebellar hemisphere, left parietooccipital lobe, and bilateral periventricular white matter. No signs of acute infarct or hemorrhage. Lateral ventricles and midline structures are stable. Right frontal ventriculostomy catheter unchanged. No acute extra-axial fluid collections. No mass effect. 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 No hyperdense vessel. Stable atherosclerosis. Skull: Stable postsurgical changes from right frontal craniotomy. No acute bony abnormalities. Sinuses/Orbits: No acute finding. Other: None. IMPRESSION: 1. No acute intracranial process. 2. Chronic ischemic changes throughout the white matter, left parietooccipital lobe, and left cerebellar hemisphere,  stable. 3. Stable postsurgical changes as above. Electronically Signed   By: Randa Ngo M.D.   On: 11/28/2020 21:14   CT HEAD WO CONTRAST  Result Date: 11/19/2020 CLINICAL DATA:  Subdural hematoma, shunted hydrocephalus, follow-up EXAM: CT HEAD WITHOUT CONTRAST TECHNIQUE: Contiguous axial images were obtained from the base of the skull through the vertex without intravenous contrast. COMPARISON:  11/17/2020 FINDINGS: Brain: Thin left cerebral convexity hypodense subdural collection is stable in size. Trace right cerebral convexity hypodense subdural collection is also unchanged. No significant mass effect. Ventricles are stable in caliber. Right frontal approach shunt catheter is in stable position. 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 Additional patchy and confluent areas of hypoattenuation likely reflecting stable chronic microvascular ischemic changes. There is gliosis along the catheter tract. No new intracranial hemorrhage. No new loss of gray-white differentiation. Vascular: Clipping of right MCA  aneurysms with associated artifact. Coil packing at the basilar tip with associated streak artifact.There is atherosclerotic calcification at the skull base. Skull: Right craniotomy and burr hole. Sinuses/Orbits: No acute finding. Other: None. IMPRESSION: No substantial change in thin cerebral convexity subdural collections. No significant mass effect. Stable ventricle caliber and positioning of shunt catheter. Electronically Signed   By: Macy Mis M.D.   On: 11/19/2020 09:30   CT HEAD WO CONTRAST  Result Date: 11/17/2020 CLINICAL DATA:  60 year old female with acute mental status changes EXAM: CT HEAD WITHOUT CONTRAST TECHNIQUE: Contiguous axial images were obtained from the base of the skull through the vertex without intravenous contrast. COMPARISON:  10/06/2020, 02/16/2018, 11/11/2013 FINDINGS: Brain: New bilateral frontal parietal extra-axial fluid collections, slightly larger on the left, intermediate density. On the left, fluid extends over the posterior temporal region. The greatest depth measured on the left from the inner table to the cortex is approximately 7 mm. On the right the greatest depth estimated is 4 mm. Slight local mass effect of the underlying brain parenchyma. Confluent hypodensity in the left posterior sylvian/parietal region, demonstrated on sequential CT studies dating to 2015. Internal density is somewhat higher than expected for chronic infarction, without contraction. Right frontal approach ventriculostomy again terminating left of midline at the third ventricle. Margin of hypodensity along the tract is again demonstrated with no acute hemorrhage. Focal hypodensity in the left cerebellar hemisphere, unchanged. Coil mass with associated artifact at the basilar tip. Surgical clips of right MCA aneurysms, with associated artifact. Subcortical hypodensity in the left frontal and parietal region, unchanged from prior. No acute intracranial hemorrhage.  No midline shift. Vascular:  Atherosclerotic calcifications. Surgical clips of right MCA aneurysm, paraclinoid and at the M 2 distribution. Coil mass at the basilar tip again demonstrated. Skull: Surgical changes of prior right pterional craniotomy. Surgical changes of right frontal approach ventriculostomy with the tip of the shunt drain terminating left of midline in the third ventricle. Sinuses/Orbits: Unremarkable paranasal sinuses. Other: None IMPRESSION: New bilateral frontoparietal subdural collection, mixed density with mild mass effect of the underlying brain. The left a slightly thicker measuring 7 mm, and also extends inferiorly over the temporal lobe. Right-sided collection measures 4 mm. The relatively hypodense region of the left parietal lobe is more dense than expected for an infarction that occurred in 2015, without the expected contraction/evolution of typical infarction appearance. A low grade glioma should be considered, alternatively local infection and further evaluation with contrast MRI is recommended. Redemonstration of surgical changes of prior right MCA aneurysm clipping x2, basilar tip  aneurysm coiling, right frontal approach ventriculostomy drain, as above. Electronically Signed   By: Corrie Mckusick D.O.   On: 11/17/2020 14:44   CT ABDOMEN PELVIS W CONTRAST  Result Date: 11/08/2020 CLINICAL DATA:  Epigastric pain EXAM: CT ABDOMEN AND PELVIS WITH CONTRAST TECHNIQUE: Multidetector CT imaging of the abdomen and pelvis was performed using the standard protocol following bolus administration of intravenous contrast. CONTRAST:  77m OMNIPAQUE IOHEXOL 300 MG/ML  SOLN COMPARISON:  None. FINDINGS: Lower chest: The visualized heart size within normal limits. No pericardial fluid/thickening. No hiatal hernia. A small left pleural effusion is seen. Patchy airspace opacity seen at the left lung base. Hepatobiliary: Multiple low-density lesions are seen throughout the liver parenchyma the largest measuring 1.2 cm in the  superior right liver lobe.The main portal vein is patent. No evidence of calcified gallstones, gallbladder wall thickening or biliary dilatation. Pancreas: There is mild pancreatic ductal dilatation measuring up to 2 mm. This is nonspecific however. Spleen: Normal in size without focal abnormality. Adrenals/Urinary Tract: Both adrenal glands appear normal. Mild bilateral renal atrophy seen. There is scattered calcification the largest measuring 3 mm in the midpole of the right kidney. No hydronephrosis. Bladder is unremarkable. Stomach/Bowel: The stomach, small bowel, are normal in appearance. There is scattered colonic diverticula. There is a moderately distended rectal fecal ball. Vascular/Lymphatic: There are no enlarged mesenteric, retroperitoneal, or pelvic lymph nodes. Scattered aortic atherosclerotic calcifications are seen without aneurysmal dilatation. Reproductive: The patient is status post hysterectomy. No adnexal masses or collections seen. Other: No evidence of abdominal wall mass or hernia. Musculoskeletal: No acute or significant osseous findings. IMPRESSION: Small left pleural effusion with patchy airspace opacity at the left lung base which may be due to atelectasis and/or infectious etiology Nonspecific mild pancreatic ductal dilatation Diverticulosis without diverticulitis Moderately dilated rectum with a fecal ball Aortic Atherosclerosis (ICD10-I70.0). Electronically Signed   By: BPrudencio PairM.D.   On: 11/08/2020 20:34   DG Chest Portable 1 View  Result Date: 11/28/2020 CLINICAL DATA:  COVID infection 3 weeks ago.  Worsening weakness. EXAM: PORTABLE CHEST 1 VIEW COMPARISON:  11/26/2020 FINDINGS: Medium-sized pleural effusions with basilar atelectasis. Mild cardiomegaly. Right IJ approach central venous catheter tip is at the cavoatrial junction. Partially visualized shunt catheter. IMPRESSION: Worsened medium-sized bilateral pleural effusions and basilar atelectasis. Electronically Signed    By: KUlyses JarredM.D.   On: 11/28/2020 20:37   DG CHEST PORT 1 VIEW  Result Date: 11/26/2020 CLINICAL DATA:  Tachypnea EXAM: PORTABLE CHEST 1 VIEW COMPARISON:  November 25, 2020 FINDINGS: Central catheter tip is at the cavoatrial junction, stable. A shunt extends along the right hemithorax. Loop recorder again noted on the left. No pneumothorax. There is moderate partially loculated pleural effusion on the right with smaller pleural effusion on the left. There is airspace opacity in each lower lung region and right mid lung region, stable. Heart is mildly enlarged with pulmonary venous hypertension, stable. No adenopathy. There is aortic atherosclerosis. No bone lesions. IMPRESSION: Stable catheter positions. No pneumothorax. Pleural effusions bilaterally, larger on the right than the left. Areas of airspace opacity in the right mid lung and bibasilar regions, stable. Stable cardiac silhouette. Aortic Atherosclerosis (ICD10-I70.0). Electronically Signed   By: WLowella GripIII M.D.   On: 11/26/2020 08:03   DG CHEST PORT 1 VIEW  Result Date: 11/25/2020 CLINICAL DATA:  60year old female with hypoxia. EXAM: PORTABLE CHEST 1 VIEW COMPARISON:  Portable chest 11/21/2020 and earlier. FINDINGS: Portable AP semi upright view at [id number] hours.  Stable right chest dual lumen dialysis type catheter. Left chest cardiac loop recorder or external defibrillator and right neck and chest shunt catheter are stable. Calcified aortic atherosclerosis. Decreased but not resolved veiling opacity in the left lower lung. Continued dense lower lobe opacity. Moderate Veiling opacity not significantly changed in the right lung. No pneumothorax. Upper lung pulmonary vascularity appears normalized. No air bronchograms. Stable cardiac size and mediastinal contours. Visualized tracheal air column is within normal limits. No acute osseous abnormality identified. IMPRESSION: 1. Regressed pulmonary edema since 11/21/2020. Moderate right and  small left pleural effusions persist with lower lobe collapse or consolidation. 2. No new cardiopulmonary abnormality. Electronically Signed   By: Genevie Ann M.D.   On: 11/25/2020 09:15   DG Chest Port 1 View  Result Date: 11/22/2020 CLINICAL DATA:  Acute respiratory distress.  Rapid response. EXAM: PORTABLE CHEST 1 VIEW COMPARISON:  Radiograph 11/10/2020 FINDINGS: Dual lead right-sided dialysis catheter remains in place. Implanted limited loop recorder projects over the left chest wall. Presumed shunt catheter tubing projects over the right hemithorax. Small bore catheter projects over the supraclavicular soft tissues on the left. Progressive bilateral pleural effusions from prior exam, right greater than left. Development of pulmonary edema. Cardiomegaly is grossly similar. Dense aortic atherosclerosis. No pneumothorax. IMPRESSION: 1. Bilateral pleural effusions, moderately large on the right and small to moderate on the left. 2. Progressive pulmonary edema. 3. Chronic cardiomegaly.  Aortic Atherosclerosis (ICD10-I70.0). Electronically Signed   By: Keith Rake M.D.   On: 11/22/2020 00:01   DG Chest Port 1 View  Result Date: 11/09/2020 CLINICAL DATA:  Shortness of breath EXAM: PORTABLE CHEST 1 VIEW COMPARISON:  November 08, 2020 FINDINGS: Central catheter tip is at the cavoatrial junction. There is a intraperitoneal shunt catheter extending along the medial right hemithorax, stable. There is ill-defined airspace opacity in the left lower lobe. Lungs elsewhere clear. Heart is mildly enlarged with pulmonary venous hypertension. There is aortic atherosclerosis. No adenopathy. There is a loop recorder on the left. No bone lesions. IMPRESSION: Central catheter position unchanged. No pneumothorax. Airspace opacity likely representing pneumonia left lower lobe. Lungs elsewhere clear. There is mild cardiomegaly with a degree of pulmonary vascular congestion. Aortic Atherosclerosis (ICD10-I70.0). Electronically  Signed   By: Lowella Grip III M.D.   On: 11/09/2020 08:37   DG Abd Portable 1V  Result Date: 11/17/2020 CLINICAL DATA:  Constipation and abdominal distension EXAM: PORTABLE ABDOMEN - 1 VIEW COMPARISON:  Abdominal CT from 8 days ago FINDINGS: The bowel gas pattern is normal. No abnormal stool retention. Peritoneal catheter which loops in the pelvis. Extensive atheromatous calcification. Opacified retrocardiac lung with evidence of small right pleural effusion, unchanged from preceding CT. IMPRESSION: Normal bowel gas pattern.  No abnormal stool retention. Electronically Signed   By: Monte Fantasia M.D.   On: 11/17/2020 10:33   DG Abd Portable 1V  Result Date: 11/09/2020 CLINICAL DATA:  Abdominal pain, anemia EXAM: PORTABLE ABDOMEN - 1 VIEW COMPARISON:  11/08/2020 CT abdomen/pelvis FINDINGS: No disproportionately dilated small bowel loops. Moderate rectal stool, similar. Mild to moderate colonic stool and gas. No evidence of pneumatosis or pneumoperitoneum. Right-sided VP shunt catheter terminates in the left abdomen and appears intact and without kink. No radiopaque nephrolithiasis. IMPRESSION: Nonobstructive bowel gas pattern. Moderate rectal stool, similar to recent CT. Mild to moderate colonic stool and gas. Electronically Signed   By: Ilona Sorrel M.D.   On: 11/09/2020 10:11   DG MINI C-ARM IMAGE ONLY  Result Date: 11/15/2020 There is  no interpretation for this exam.  This order is for images obtained during a surgical procedure.  Please See "Surgeries" Tab for more information regarding the procedure.   VAS [place] LOWER EXTREMITY VENOUS (DVT)  Result Date: 11/17/2020  Lower Venous DVT Study Indications: Covid-19, Age indeterminate DVT found in the right common femoral vein 11/14/20, unable to receive anticoagulants secondary to [place].  Limitations: Pain with compression. Comparison Study: Prior study done 11/14/20 is available for comparison Performing Technologist: Sharion Dove RVS  Examination  Guidelines: A complete evaluation includes B-mode imaging, spectral Doppler, color Doppler, and power Doppler as needed of all accessible portions of each vessel. Bilateral testing is considered an integral part of a complete examination. Limited examinations for reoccurring indications may be performed as noted. The reflux portion of the exam is performed with the patient in reverse Trendelenburg.  +---------+---------------+---------+-----------+----------+-------------------+ RIGHT    CompressibilityPhasicitySpontaneityPropertiesThrombus Aging      +---------+---------------+---------+-----------+----------+-------------------+ CFV      Full                                         pulsatile flow, age                                                       indeterminate       +---------+---------------+---------+-----------+----------+-------------------+ SFJ      Full                                                             +---------+---------------+---------+-----------+----------+-------------------+ FV Prox                                               patent by color and                                                       Doppler             +---------+---------------+---------+-----------+----------+-------------------+ FV Mid                                                patent by color and                                                       Doppler             +---------+---------------+---------+-----------+----------+-------------------+ FV Distal  patent by color and                                                       Doppler             +---------+---------------+---------+-----------+----------+-------------------+ POP                                                   pulsatile flow,                                                           patent by color and                                                        Doppler             +---------+---------------+---------+-----------+----------+-------------------+ PTV      Full                                                             +---------+---------------+---------+-----------+----------+-------------------+ PERO     Full                                                             +---------+---------------+---------+-----------+----------+-------------------+ small, age indeterminate thrombus attached to vein wall  +---------+---------------+---------+-----------+----------+--------------+ LEFT     CompressibilityPhasicitySpontaneityPropertiesThrombus Aging +---------+---------------+---------+-----------+----------+--------------+ CFV      Full                                         pulsatile flow +---------+---------------+---------+-----------+----------+--------------+ SFJ      Full                                                        +---------+---------------+---------+-----------+----------+--------------+ FV Prox  Full                                                        +---------+---------------+---------+-----------+----------+--------------+ FV Mid   Full                                                        +---------+---------------+---------+-----------+----------+--------------+  FV DistalFull                                                        +---------+---------------+---------+-----------+----------+--------------+ PFV      Full                                                        +---------+---------------+---------+-----------+----------+--------------+ POP      Full                                         pulsatile flow +---------+---------------+---------+-----------+----------+--------------+ PTV      Full                                                         +---------+---------------+---------+-----------+----------+--------------+     Summary: RIGHT: - Findings consistent with age indeterminate deep vein thrombosis involving the right common femoral vein. - Findings appear improved from previous examination.  LEFT: - No evidence of deep vein thrombosis in the lower extremity. No indirect evidence of obstruction proximal to the inguinal ligament.  *See table(s) above for measurements and observations. Electronically signed by Jamelle Haring on 11/17/2020 at 2:46:36 PM.    Final    VAS [place] LOWER EXTREMITY VENOUS (DVT)  Result Date: 11/14/2020  Lower Venous DVT Study Indications: D-dimer, Covid-19.  Comparison Study: No prior studies. Performing Technologist: Darlin Coco RDMS  Examination Guidelines: A complete evaluation includes B-mode imaging, spectral Doppler, color Doppler, and power Doppler as needed of all accessible portions of each vessel. Bilateral testing is considered an integral part of a complete examination. Limited examinations for reoccurring indications may be performed as noted. The reflux portion of the exam is performed with the patient in reverse Trendelenburg.  +---------+---------------+---------+-----------+----------+-----------------+ RIGHT    CompressibilityPhasicitySpontaneityPropertiesThrombus Aging    +---------+---------------+---------+-----------+----------+-----------------+ CFV      Partial        No       Yes                  Age Indeterminate +---------+---------------+---------+-----------+----------+-----------------+ SFJ      Full           No       Yes                                    +---------+---------------+---------+-----------+----------+-----------------+ FV Prox                 No       Yes                                    +---------+---------------+---------+-----------+----------+-----------------+ FV Mid                  No  Yes                                     +---------+---------------+---------+-----------+----------+-----------------+ FV Distal               No       Yes                                    +---------+---------------+---------+-----------+----------+-----------------+ PFV                     No       Yes                                    +---------+---------------+---------+-----------+----------+-----------------+ POP                     No       Yes                                    +---------+---------------+---------+-----------+----------+-----------------+ PTV                     No       Yes                                    +---------+---------------+---------+-----------+----------+-----------------+   +---------+---------------+---------+-----------+----------+--------------+ LEFT     CompressibilityPhasicitySpontaneityPropertiesThrombus Aging +---------+---------------+---------+-----------+----------+--------------+ CFV      Full           No       Yes                                 +---------+---------------+---------+-----------+----------+--------------+ SFJ      Full                                                        +---------+---------------+---------+-----------+----------+--------------+ FV Prox  Full                                                        +---------+---------------+---------+-----------+----------+--------------+ FV Mid   Full                                                        +---------+---------------+---------+-----------+----------+--------------+ FV DistalFull                                                        +---------+---------------+---------+-----------+----------+--------------+  PFV      Full           No       Yes                                 +---------+---------------+---------+-----------+----------+--------------+ POP      Full           No       Yes                                  +---------+---------------+---------+-----------+----------+--------------+ PTV      Full                                                        +---------+---------------+---------+-----------+----------+--------------+ PERO     Full                                                        +---------+---------------+---------+-----------+----------+--------------+     Summary: RIGHT: - Findings consistent with a partial, non-obstructing age indeterminate deep vein thrombosis involving the right common femoral vein. - No cystic structure found in the popliteal fossa.  LEFT: - There is no evidence of deep vein thrombosis in the lower extremity.  - No cystic structure found in the popliteal fossa.  *See table(s) above for measurements and observations. Electronically signed by Harold Barban MD on 11/14/2020 at 9:38:42 PM.    Final    [place] EKG SITE RITE  Result Date: 11/09/2020 If Site Rite image not attached, placement could not be confirmed due to current cardiac rhythm.  IR THORACENTESIS ASP PLEURAL SPACE W/IMG GUIDE  Result Date: 11/26/2020 INDICATION: Patient with history of COVID-19 pneumonia, ESRD on HD, atrial fibrillation, dyspnea, and bilateral pleural effusions, R>L. Request is made for therapeutic right thoracentesis. EXAM: ULTRASOUND GUIDED THERAPEUTIC RIGHT THORACENTESIS MEDICATIONS: 7 mL 1% lidocaine COMPLICATIONS: None immediate. PROCEDURE: An ultrasound guided thoracentesis was thoroughly discussed with the patient and questions answered. The benefits, risks, alternatives and complications were also discussed. The patient understands and wishes to proceed with the procedure. Written consent was obtained. Ultrasound was performed to localize and mark an adequate pocket of fluid in the right chest. The area was then prepped and draped in the normal sterile fashion. 1% Lidocaine was used for local anesthesia. Under ultrasound guidance a 6 Fr Safe-T-Centesis catheter was introduced.  Thoracentesis was performed. The catheter was removed and a dressing applied. FINDINGS: A total of approximately 1 L of hazy gold fluid was removed. Procedure was stopped after 1 L per patient request due to patient's symptom (chest pain). IMPRESSION: Successful ultrasound guided right thoracentesis yielding 1 L of pleural fluid. Read by: Earley Abide, PA-C Electronically Signed   By: Corrie Mckusick D.O.   On: 11/26/2020 15:26     PERTINENT LAB RESULTS: CBC: Recent Labs    11/28/20 1932 11/28/20 2027 11/29/20 0547  WBC 12.8*  --  9.6  HGB 12.1 13.6 13.6  HCT 35.6* 40.0 42.2  PLT 204  --  220   CMET  CMP     Component Value Date/Time   NA 137 11/29/2020 0547   K 4.2 11/29/2020 0547   CL 94 (L) 11/29/2020 0547   CO2 25 11/29/2020 0547   GLUCOSE 103 (H) 11/29/2020 0547   BUN 32 (H) 11/29/2020 0547   CREATININE 6.15 (H) 11/29/2020 0547   CALCIUM 9.7 11/29/2020 0547   PROT 5.1 (L) 11/28/2020 1932   ALBUMIN 2.4 (L) 11/29/2020 0547   AST 24 11/28/2020 1932   ALT 16 11/28/2020 1932   ALKPHOS 56 11/28/2020 1932   BILITOT 1.1 11/28/2020 1932   GFRNONAA 7 (L) 11/29/2020 0547   GFRAA 5 (L) 02/03/2019 0652    GFR Estimated Creatinine Clearance: 7.4 mL/min (A) (by C-G formula based on SCr of 6.15 mg/dL (H)). No results for input(s): LIPASE, AMYLASE in the last 72 hours. No results for input(s): CKTOTAL, CKMB, CKMBINDEX, TROPONINI in the last 72 hours. Invalid input(s): POCBNP No results for input(s): DDIMER in the last 72 hours. No results for input(s): HGBA1C in the last 72 hours. No results for input(s): CHOL, HDL, LDLCALC, TRIG, CHOLHDL, LDLDIRECT in the last 72 hours. No results for input(s): TSH, T4TOTAL, T3FREE, THYROIDAB in the last 72 hours.  Invalid input(s): FREET3 No results for input(s): VITAMINB12, FOLATE, FERRITIN, TIBC, IRON, RETICCTPCT in the last 72 hours. Coags: Recent Labs    11/28/20 1932  INR 1.2   Microbiology: No results found for this or any previous  visit (from the past 240 hour(s)).  FURTHER DISCHARGE INSTRUCTIONS:  Get Medicines reviewed and adjusted: Please take all your medications with you for your next visit with your Primary MD  Laboratory/radiological data: Please request your Primary MD to go over all hospital tests and procedure/radiological results at the follow up, please ask your Primary MD to get all Hospital records sent to his/her office.  In some cases, they will be blood work, cultures and biopsy results pending at the time of your discharge. Please request that your primary care M.D. goes through all the records of your hospital data and follows up on these results.  Also Note the following: If you experience worsening of your admission symptoms, develop shortness of breath, life threatening emergency, suicidal or homicidal thoughts you must seek medical attention immediately by calling 911 or calling your MD immediately  if symptoms less severe.  You must read complete instructions/literature along with all the possible adverse reactions/side effects for all the Medicines you take and that have been prescribed to you. Take any new Medicines after you have completely understood and accpet all the possible adverse reactions/side effects.   Do not drive when taking Pain medications or sleeping medications (Benzodaizepines)  Do not take more than prescribed Pain, Sleep and Anxiety Medications. It is not advisable to combine anxiety,sleep and pain medications without talking with your primary care practitioner  Special Instructions: If you have smoked or chewed Tobacco  in the last 2 yrs please stop smoking, stop any regular Alcohol  and or any Recreational drug use.  Wear Seat belts while driving.  Please note: You were cared for by a hospitalist during your hospital stay. Once you are discharged, your primary care physician will handle any further medical issues. Please note that NO REFILLS for any discharge  medications will be authorized once you are discharged, as it is imperative that you return to your primary care physician (or establish a relationship with a primary care physician if you do not have one) for your post hospital discharge needs  so that they can reassess your need for medications and monitor your lab values.  Total Time spent coordinating discharge including counseling, education and face to face time equals 35 minutes.  SignedOren Binet 11/30/2020 10:57 AM

## 2020-11-30 NOTE — Progress Notes (Signed)
Orthopedic Tech Progress Note Patient Details:  Faythe Hulse 08-Aug-1961 [id number] Handed splint to RN Ortho Devices Type of Ortho Device: Velcro wrist forearm splint Ortho Device/Splint Location: LUE Ortho Device/Splint Interventions: Ordered,Other (comment)   Post Interventions Patient Tolerated: Well Instructions Provided: Care of device   Janit Pagan 11/30/2020, 3:21 PM

## 2020-11-30 NOTE — TOC Transition Note (Signed)
Transition of Care [facility]) - CM/SW Discharge Note   Patient Details  Name: [name] MRN: [id number] Date of Birth: Feb 26, 1961  [facility] CM/SW Contact:  Benard Halsted, LCSW Phone Number: 11/30/2020, 11:50 AM   Clinical Narrative:    Patient will DC to: [facility] Anticipated DC date: 11/30/20 Family notified: Daughter, Multimedia programmer by: Corey Harold   Per MD patient ready for [place]. RN to call report prior to discharge [phone]). RN, patient, patient's family, and facility notified of DC. Discharge Summary and FL2 sent to facility. DC packet on chart. Ambulance transport requested for patient.   CSW will sign off for now as social work intervention is no longer needed. Please consult [place] again if new needs arise.      Final next level of care: Skilled Nursing Facility Barriers to Discharge: Barriers Resolved   Patient Goals and CMS Choice Patient states their goals for this hospitalization and ongoing recovery are:: to go to SNF   Choice offered to / list presented to : NA  Discharge Placement   Existing PASRR number confirmed : 11/30/20          Patient chooses bed at: [facility] Patient to be transferred to facility by: [place] Name of family member notified: Sidonie Dickens, Daughter [phone] Patient and family notified of of transfer: 11/30/20  Discharge Plan and Services In-house Referral: NA Discharge Planning Services: CM Consult Post Acute Care Choice: [place]          DME Arranged: N/A DME Agency: NA       HH Arranged: NA HH Agency: NA        Social Determinants of Health (SDOH) Interventions     Readmission Risk Interventions No flowsheet data found.

## 2020-11-30 NOTE — TOC Progression Note (Signed)
Transition of Care [facility]) - Progression Note    Patient Details  Name: [name] MRN: [id number] Date of Birth: 1961-07-28  Transition of Care [facility]) CM/SW [place], LCSW Phone Number: 11/30/2020, 11:41 AM  Clinical Narrative:    CSW received insurance approval for patient to go to [facility]: [id number], effective 2/10-2/14/22.  CSW confirmed with Dr. Jonnie Finner that patient does not need Dialysis today and can go tomorrow outpatient at her normal time.   CSW contacted [facility] and made them aware that patient will be going to OP HD tomorrow (needs to be there at 12pm for 12:20pm chair time).   Patient's daughter aware.   Expected Discharge Plan: [place] Barriers to Discharge: Continued Medical Work up  Expected Discharge Plan and Services Expected Discharge Plan: [employer] In-house Referral: NA Discharge Planning Services: CM Consult Post Acute Care Choice: [place] Living arrangements for the past 2 months: Single Family Home Expected Discharge Date: 11/30/20               DME Arranged: N/A DME Agency: NA       HH Arranged: NA HH Agency: NA         Social Determinants of Health (SDOH) Interventions    Readmission Risk Interventions No flowsheet data found.

## 2020-11-30 NOTE — Progress Notes (Signed)
Reinaldo Meeker [phone]   To give report and received no answer.

## 2020-12-10 ENCOUNTER — Other Ambulatory Visit: Payer: Self-pay | Admitting: Neurosurgery

## 2020-12-10 DIAGNOSIS — G911 Obstructive hydrocephalus: Secondary | ICD-10-CM

## 2020-12-12 ENCOUNTER — Other Ambulatory Visit: Payer: Self-pay

## 2020-12-12 ENCOUNTER — Ambulatory Visit (INDEPENDENT_AMBULATORY_CARE_PROVIDER_SITE_OTHER): Payer: 59 | Admitting: Cardiology

## 2020-12-12 ENCOUNTER — Encounter: Payer: Self-pay | Admitting: Cardiology

## 2020-12-12 VITALS — BP 136/52 | HR 72 | Ht 61.0 in

## 2020-12-12 DIAGNOSIS — Z992 Dependence on renal dialysis: Secondary | ICD-10-CM

## 2020-12-12 DIAGNOSIS — I480 Paroxysmal atrial fibrillation: Secondary | ICD-10-CM | POA: Diagnosis not present

## 2020-12-12 DIAGNOSIS — N186 End stage renal disease: Secondary | ICD-10-CM

## 2020-12-12 DIAGNOSIS — I2510 Atherosclerotic heart disease of native coronary artery without angina pectoris: Secondary | ICD-10-CM | POA: Diagnosis not present

## 2020-12-12 DIAGNOSIS — Z09 Encounter for follow-up examination after completed treatment for conditions other than malignant neoplasm: Secondary | ICD-10-CM | POA: Diagnosis not present

## 2020-12-12 DIAGNOSIS — E7800 Pure hypercholesterolemia, unspecified: Secondary | ICD-10-CM

## 2020-12-12 DIAGNOSIS — I10 Essential (primary) hypertension: Secondary | ICD-10-CM

## 2020-12-12 NOTE — Progress Notes (Signed)
Cardiology Office Note:    Date:  12/12/2020   ID:  [name], DOB [birth year], MRN [id number]  PCP:  Pcp, No  Cardiologist:  Buford Dresser, MD  Referring MD: Clovia Cuff, MD   CC: 14 day transition of care post discharge follow up  History of Present Illness:    [name] is a 60 y.o. 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 Not a candidate for long term anticoagulation given history of ICH. Not felt to be a good candidate for cath, recommended for medical management.  Today: Reviewed recent hospitalization, discharged 11/30/20. Before that, had prolonged hospitalization for Covid pneumonia from 1/20-2/8. Both hospital courses, discharge summaries reviewed. Medication reconciliation performed today.  Here with CNA from her SNF today. Overall feels like she is back to baseline. Tolerating dialysis. She only gives 1-2 word answers to questions but denies most of ROS. ROS only positive for palpitations.   Specifically denies chest pain, shortness of breath at rest or with normal exertion. No PND, orthopnea, LE edema or unexpected weight gain. No syncope.  Past Medical History:  Diagnosis Date  . Anterior cerebral artery aneurysm    1996 [facility]  . CAD (coronary artery disease)    a. 2009 for NSTEMI with 70% dLAD, 30% mCx, 50% dCx, 95% dRCA, 40% PLA -> RCA was treated with DES.  . Diastolic heart failure    LVEF 60-65% with grade 2 diastolic dysfunction - July 2014  . Encephalopathy   . ESRD (end stage renal disease) on dialysis ([place])   . HTN (hypertension)    Resistant  . Hypercholesterolemia   . PAF (paroxysmal atrial fibrillation) ([place]) 02/03/2019  . Poor historian   . Seizures ([place])   . 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 Subarachnoid hemorrhage ([place])    03/2012  . Tobacco abuse    Resumed smoking half a pack a day. She was a smoker in the past and states she was abl eto stop in the past using nicotine patches    Past Surgical History:  Procedure Laterality Date  . ABDOMINAL HYSTERECTOMY    . BASCILIC VEIN TRANSPOSITION Right 05/13/2013   Procedure: [place];  Surgeon: Rosetta Posner, MD;  Location: [place];  Service: Vascular;  Laterality: Right;  . BRAIN SURGERY     2   . INSERTION OF DIALYSIS CATHETER Right 10/15/2020   Procedure: INSERTION OF TUNNELED DIALYSIS CATHETER;  Surgeon: Cherre Robins, MD;  Location: [facility] OR;  Service: Vascular;  Laterality: Right;  . IR THORACENTESIS ASP PLEURAL SPACE W/IMG GUIDE  11/26/2020  . LOOP RECORDER IMPLANT N/A 11/14/2013   Procedure: LOOP RECORDER IMPLANT;  Surgeon: Evans Lance, MD;  Location: [facility] CATH LAB;  Service: Cardiovascular;  Laterality: N/A;  . OPEN REDUCTION INTERNAL FIXATION (ORIF) DISTAL RADIAL FRACTURE Left 11/15/2020   Procedure: OPEN REDUCTION INTERNAL FIXATION (ORIF) DISTAL RADIAL FRACTURE;  Surgeon: Verner Mould, MD;  Location: [place];  Service: Orthopedics;  Laterality: Left;  . REVISON OF ARTERIOVENOUS FISTULA Right 10/15/2020   Procedure: REVISON OF ARTERIOVENOUS FISTULA ARM;  Surgeon: Cherre Robins, MD;  Location: [place];  Service: Vascular;  Laterality: Right;  . TEE WITHOUT CARDIOVERSION N/A 11/14/2013   Procedure: TRANSESOPHAGEAL ECHOCARDIOGRAM (TEE);  Surgeon: Candee Furbish, MD;  [facility] ENDOSCOPY;  Service: Cardiovascular;  Laterality: N/A;  . VENTRICULOPERITONEAL SHUNT  03/27/2012   Procedure: SHUNT INSERTION VENTRICULAR-PERITONEAL;  Surgeon: Winfield Cunas, MD;  Location: [place] NEURO ORS;  Service: Neurosurgery;  Laterality: N/A;  Ventricular-Peritoneal Shunt Insertion    Current Medications: Current Outpatient Medications on File Prior to Visit  Medication Sig  . amiodarone (PACERONE) 200 MG tablet  Take 1 tablet (200 mg total) by mouth daily.  Marland Kitchen aspirin 81 MG EC tablet Take 81 mg by mouth every 8 (eight) hours as needed for pain.  . calcitRIOL (ROCALTROL) 0.5 MCG capsule Take 1 capsule (0.5 mcg total) by mouth Every Tuesday,Thursday,and Saturday with dialysis.  Marland Kitchen calcium acetate (PHOSLO) 667 MG capsule Take 667 mg by mouth 3 (three) times daily.  Marland Kitchen levETIRAcetam (KEPPRA) 500 MG tablet Take 1 tablet (500 mg total) by mouth 2 (two) times daily.  . metoprolol tartrate (LOPRESSOR) 25 MG tablet Take 1 tablet (25 mg total) by mouth 2 (two) times daily.  . midodrine (PROAMATINE) 10 MG tablet Take 1 tablet (10 mg total) by mouth 2 (two) times daily with a meal.  . multivitamin (RENA-VIT) TABS tablet Take 1 tablet by mouth at bedtime.  . nitroGLYCERIN (NITROSTAT) 0.4 MG SL tablet Place 1 tablet (0.4 mg total) under the tongue every 5 (five) minutes as needed for chest pain.  . Nutritional Supplements (FEEDING SUPPLEMENT, NEPRO CARB STEADY,) LIQD Take 237 mLs by mouth 3 (three) times daily between meals.  . pantoprazole (PROTONIX) 40 MG tablet Take 1 tablet (40 mg total) by mouth daily.  . polyethylene glycol (MIRALAX / GLYCOLAX) 17 g packet Take 17 g by mouth 2 (two) times daily.  . rosuvastatin (CRESTOR) 10 MG tablet Take 10 mg by mouth daily.   No current facility-administered medications on file prior to visit.     Allergies:   Amoxicillin and Penicillins   Social History   Tobacco Use  . Smoking status: Former Smoker    Packs/day: 0.20    Years: 35.00    Pack years: 7.00    Types: Cigarettes    Quit date: 03/20/2017    Years since quitting: 3.7  . Smokeless tobacco: Never Used  . Tobacco comment: 1/2 ppd   Vaping Use  . Vaping Use: Never used  Substance Use Topics  . Alcohol use: No  . Drug use: No    Family History: family history includes Coronary artery disease in an other family member; Heart disease in her father and mother.  ROS:   Please see the history of present  illness, ROS unremarkable except as noted.  EKGs/Labs/Other Studies Reviewed:    The following studies were reviewed today: Echo 10/07/2020 1. Left ventricular ejection fraction, by estimation, is 65 to 70%. The  left ventricle has normal function. The left ventricle has no regional  wall motion abnormalities. There is severe left ventricular hypertrophy.  Left ventricular diastolic parameters  are indeterminate. Elevated left atrial pressure.  2. Right ventricular systolic function is normal. The right ventricular  size is normal. There is normal pulmonary artery systolic pressure.  3. Left atrial size was mildly dilated.  4. The mitral valve is normal in structure. No evidence of mitral valve  regurgitation. No evidence of mitral stenosis.  5. The aortic valve is tricuspid. There is mild calcification of the  aortic valve. There is mild thickening of the aortic valve. Aortic valve  regurgitation is not visualized. No aortic stenosis is present.  6. The inferior vena cava is normal in size with greater than 50%  respiratory variability, suggesting right atrial pressure of 3 mmHg.  EKG:  EKG is personally reviewed.  The ekg ordered 11/28/20 demonstrates NSR, LVH, prior anterior and inferior infarct.  Recent Labs: 11/11/2020: TSH 0.010 11/13/2020: B Natriuretic Peptide >4,500.0 11/16/2020: Magnesium 2.3 11/28/2020: ALT 16 11/29/2020: BUN 32; Creatinine, Ser 6.15; Hemoglobin 13.6; Platelets 220; Potassium 4.2; Sodium 137  Recent Lipid Panel    Component Value Date/Time   CHOL 219 (H) 10/07/2020 0205   TRIG 112 11/09/2020 0007   HDL 59 10/07/2020 0205   CHOLHDL 3.7 10/07/2020 0205   VLDL 16 10/07/2020 0205   LDLCALC 144 (H) 10/07/2020 0205    Physical Exam:    VS:  BP (!) 136/52 (BP Location: Left Arm, Patient Position: Sitting, Cuff Size: Normal)   Pulse 72   Ht '5\' 1"'$  (1.549 m)   BMI 19.66 kg/m     Wt Readings from Last 3 Encounters:  11/30/20 104 lb 0.9 oz (47.2 kg)   11/27/20 89 lb 1.1 oz (40.4 kg)  10/26/20 89 lb 12.8 oz (40.7 kg)    GEN: frail, chronically ill appearing, in wheelchair with O2 by nasal cannula, in no acute distress HEENT: Normal, moist mucous membranes NECK: No JVD visible upright CARDIAC: regular rhythm, normal S1 and S2, no rubs or gallops. 3/6 systolic murmur. VASCULAR: Radial and DP pulses 2+ bilaterally. No carotid bruits RESPIRATORY:  Clear to auscultation without rales, wheezing or rhonchi  ABDOMEN: Soft, non-tender, non-distended MUSCULOSKELETAL:  Moves all 4 limbs independently but remains in wheelchair SKIN: Warm and dry, no edema NEUROLOGIC:  Limited historian. No focal neuro deficits noted. PSYCHIATRIC:  Flat affect   ASSESSMENT:    1. Hospital discharge follow-up   2. Paroxysmal atrial fibrillation with RVR (HCC)   3. End-stage renal disease on hemodialysis ([place])   4. Coronary artery disease involving native coronary artery of native heart without angina pectoris   5. Primary hypertension   6. Pure hypercholesterolemia, unspecified    PLAN:    Atrial fibrillation, paroxysmal, today in sinus rhythm -not on anticoagulation due to history of ICH/SAH -continue amiodarone 200 mg daily, metoprolol tartrate 25 mg BID  ESRD on HD -followed by nephrology  CAD, with STEMI in 2009 and recent NSTEMI Hypertension Hypercholesterolemia -continue aspirin, rosuvastatin -on metoprolol as above -BP acceptable today, has midodrine for hypotension -denies chest pain  Recent prolonged hospitalization for Covid pneumonia -now in SNF, on O2 -found to have RLE SVT, not on anticoagulation given history above  Plan for follow up: 6 mos or sooner as needed  Buford Dresser, MD, PhD, [facility]    Medication Adjustments/Labs and Tests Ordered: Current medicines are reviewed at length with the patient today.  Concerns regarding medicines are outlined above.  No orders of the defined types were  placed in this encounter.  No orders of the defined types were placed in this encounter.   Patient Instructions  Medication Instructions:  No changes to your cardiac medications at this time  *If you need a refill on your cardiac medications before your next appointment, please call your pharmacy*   Lab Work: None  Testing/Procedures: None   Follow-Up: At [facility], you and your health needs are our priority.  As part of our continuing mission to provide you with exceptional heart care, we have created designated Provider Care Teams.  These Care Teams include your primary Cardiologist (physician) and Advanced  Practice Providers (APPs -  Physician Assistants and Nurse Practitioners) who all work together to provide you with the care you need, when you need it.  We recommend signing up for the patient portal called "MyChart".  Sign up information is provided on this After Visit Summary.  MyChart is used to connect with patients for Virtual Visits (Telemedicine).  Patients are able to view lab/test results, encounter notes, upcoming appointments, etc.  Non-urgent messages can be sent to your provider as well.   To learn more about what you can do with MyChart, go to [email or web address].    Your next appointment:   6 month(s) or sooner as needed  The format for your next appointment:   In Person  Provider:   Buford Dresser, MD      Signed, Buford Dresser, MD PhD 12/12/2020 1:05 PM    [place]

## 2020-12-12 NOTE — Patient Instructions (Addendum)
Medication Instructions:  No changes to your cardiac medications at this time  *If you need a refill on your cardiac medications before your next appointment, please call your pharmacy*   Lab Work: None  Testing/Procedures: None   Follow-Up: At [facility], you and your health needs are our priority.  As part of our continuing mission to provide you with exceptional heart care, we have created designated Provider Care Teams.  These Care Teams include your primary Cardiologist (physician) and Advanced Practice Providers (APPs -  Physician Assistants and Nurse Practitioners) who all work together to provide you with the care you need, when you need it.  We recommend signing up for the patient portal called "MyChart".  Sign up information is provided on this After Visit Summary.  MyChart is used to connect with patients for Virtual Visits (Telemedicine).  Patients are able to view lab/test results, encounter notes, upcoming appointments, etc.  Non-urgent messages can be sent to your provider as well.   To learn more about what you can do with MyChart, go to [email or web address].    Your next appointment:   6 month(s) or sooner as needed  The format for your next appointment:   In Person  Provider:   Buford Dresser, MD

## 2021-01-04 ENCOUNTER — Ambulatory Visit (INDEPENDENT_AMBULATORY_CARE_PROVIDER_SITE_OTHER): Payer: 59 | Admitting: Physician Assistant

## 2021-01-04 ENCOUNTER — Other Ambulatory Visit: Payer: Self-pay

## 2021-01-04 VITALS — BP 144/75 | HR 77 | Temp 98.4°F | Resp 20

## 2021-01-04 DIAGNOSIS — N186 End stage renal disease: Secondary | ICD-10-CM

## 2021-01-04 NOTE — Progress Notes (Signed)
POST OPERATIVE OFFICE NOTE    CC:  F/u for surgery  HPI:  This is a 60 y.o. female who is s/p revision of right arm AVF and insertion of TDC on 10/15/2020 by Dr. [name]   Pt states she does not have pain/numbness in the right hand.    The pt is on dialysis but does not know what her center is.   Allergies  Allergen Reactions  . Amoxicillin Hives and Rash    Informed by pt's daughter [name]   . Penicillins Hives and Rash    Has patient had a PCN reaction causing immediate rash, facial/tongue/throat swelling, SOB or lightheadedness with hypotension: Yes Has patient had a PCN reaction causing severe rash involving mucus membranes or skin necrosis: No Has patient had a PCN reaction that required hospitalization: No Has patient had a PCN reaction occurring within the last 10 years: No If all of the above answers are "NO", then may proceed with Cephalosporin use. PATIENT TOLERATED CEFAZOLIN IN OR 11/15/20     Current Outpatient Medications  Medication Sig Dispense Refill  . amiodarone (PACERONE) 200 MG tablet Take 1 tablet (200 mg total) by mouth daily.    Marland Kitchen aspirin 81 MG EC tablet Take 81 mg by mouth every 8 (eight) hours as needed for pain.    . calcitRIOL (ROCALTROL) 0.5 MCG capsule Take 1 capsule (0.5 mcg total) by mouth Every Tuesday,Thursday,and Saturday with dialysis. 15 capsule 0  . calcium acetate (PHOSLO) 667 MG capsule Take 667 mg by mouth 3 (three) times daily.    Marland Kitchen levETIRAcetam (KEPPRA) 500 MG tablet Take 1 tablet (500 mg total) by mouth 2 (two) times daily. 180 tablet 0  . metoprolol tartrate (LOPRESSOR) 25 MG tablet Take 1 tablet (25 mg total) by mouth 2 (two) times daily. 180 tablet 0  . midodrine (PROAMATINE) 10 MG tablet Take 1 tablet (10 mg total) by mouth 2 (two) times daily with a meal. 60 tablet 2  . multivitamin (RENA-VIT) TABS tablet Take 1 tablet by mouth at bedtime.  0  . nitroGLYCERIN (NITROSTAT) 0.4 MG SL tablet Place 1 tablet (0.4 mg total) under  the tongue every 5 (five) minutes as needed for chest pain. 20 tablet 0  . Nutritional Supplements (FEEDING SUPPLEMENT, NEPRO CARB STEADY,) LIQD Take 237 mLs by mouth 3 (three) times daily between meals.    . pantoprazole (PROTONIX) 40 MG tablet Take 1 tablet (40 mg total) by mouth daily. 30 tablet 0  . polyethylene glycol (MIRALAX / GLYCOLAX) 17 g packet Take 17 g by mouth 2 (two) times daily. 14 each 0  . rosuvastatin (CRESTOR) 10 MG tablet Take 10 mg by mouth daily.     No current facility-administered medications for this visit.     ROS:  See HPI  Physical Exam:  Today's Vitals   01/04/21 1031  BP: (!) 144/75  Pulse: 77  Resp: 20  Temp: 98.4 F (36.9 C)  TempSrc: Temporal  SpO2: 98%   There is no height or weight on file to calculate BMI.   Incision:  Healed nicely with staples and sutures in tact. Extremities:   There is a palpable right radial pulse.   Motor and sensory are in tact.   There is a thrill/bruit present.  The fistula is easily palpable     Assessment/Plan:  This is a 60 y.o. female who is s/p: revision of right arm AVF and insertion of TDC on 10/15/2020 by Dr. [name]   -the pt  does not have evidence of steal. -the fistulacan be used in one week. -If pt has a tunneled dialysis catheter and the access has been used successfully to the satisfaction of the dialysis center, the tunneled catheter can be scheduled to be removed at their discretion.   -the pt will follow up as needed   Leontine Locket, [facility] [phone]  Clinic MD:  Donzetta Matters

## 2021-02-28 ENCOUNTER — Emergency Department (HOSPITAL_COMMUNITY): Payer: 59

## 2021-02-28 ENCOUNTER — Other Ambulatory Visit: Payer: Self-pay

## 2021-02-28 ENCOUNTER — Inpatient Hospital Stay (HOSPITAL_COMMUNITY)
Admission: EM | Admit: 2021-02-28 | Discharge: 2021-03-13 | DRG: 291 | Disposition: A | Discharge status: Hospice | Payer: 59 | Source: Ambulatory Visit | Attending: Hospitalist | Admitting: Hospitalist

## 2021-02-28 DIAGNOSIS — E43 Unspecified severe protein-calorie malnutrition: Secondary | ICD-10-CM | POA: Diagnosis present

## 2021-02-28 DIAGNOSIS — Z7982 Long term (current) use of aspirin: Secondary | ICD-10-CM

## 2021-02-28 DIAGNOSIS — T8243XA Leakage of vascular dialysis catheter, initial encounter: Secondary | ICD-10-CM | POA: Diagnosis present

## 2021-02-28 DIAGNOSIS — I70203 Unspecified atherosclerosis of native arteries of extremities, bilateral legs: Secondary | ICD-10-CM | POA: Diagnosis present

## 2021-02-28 DIAGNOSIS — G9341 Metabolic encephalopathy: Secondary | ICD-10-CM | POA: Diagnosis not present

## 2021-02-28 DIAGNOSIS — Y712 Prosthetic and other implants, materials and accessory cardiovascular devices associated with adverse incidents: Secondary | ICD-10-CM | POA: Diagnosis present

## 2021-02-28 DIAGNOSIS — Z992 Dependence on renal dialysis: Secondary | ICD-10-CM

## 2021-02-28 DIAGNOSIS — K219 Gastro-esophageal reflux disease without esophagitis: Secondary | ICD-10-CM | POA: Diagnosis present

## 2021-02-28 DIAGNOSIS — J449 Chronic obstructive pulmonary disease, unspecified: Secondary | ICD-10-CM | POA: Diagnosis present

## 2021-02-28 DIAGNOSIS — Z4659 Encounter for fitting and adjustment of other gastrointestinal appliance and device: Secondary | ICD-10-CM

## 2021-02-28 DIAGNOSIS — T82898A Other specified complication of vascular prosthetic devices, implants and grafts, initial encounter: Secondary | ICD-10-CM | POA: Diagnosis present

## 2021-02-28 DIAGNOSIS — G928 Other toxic encephalopathy: Secondary | ICD-10-CM | POA: Diagnosis not present

## 2021-02-28 DIAGNOSIS — Z8249 Family history of ischemic heart disease and other diseases of the circulatory system: Secondary | ICD-10-CM

## 2021-02-28 DIAGNOSIS — Z8673 Personal history of transient ischemic attack (TIA), and cerebral infarction without residual deficits: Secondary | ICD-10-CM

## 2021-02-28 DIAGNOSIS — G40909 Epilepsy, unspecified, not intractable, without status epilepticus: Secondary | ICD-10-CM | POA: Diagnosis present

## 2021-02-28 DIAGNOSIS — Y841 Kidney dialysis as the cause of abnormal reaction of the patient, or of later complication, without mention of misadventure at the time of the procedure: Secondary | ICD-10-CM | POA: Diagnosis present

## 2021-02-28 DIAGNOSIS — Z515 Encounter for palliative care: Secondary | ICD-10-CM | POA: Diagnosis not present

## 2021-02-28 DIAGNOSIS — E8889 Other specified metabolic disorders: Secondary | ICD-10-CM | POA: Diagnosis present

## 2021-02-28 DIAGNOSIS — Z66 Do not resuscitate: Secondary | ICD-10-CM | POA: Diagnosis not present

## 2021-02-28 DIAGNOSIS — I5032 Chronic diastolic (congestive) heart failure: Secondary | ICD-10-CM | POA: Diagnosis present

## 2021-02-28 DIAGNOSIS — G629 Polyneuropathy, unspecified: Secondary | ICD-10-CM | POA: Diagnosis not present

## 2021-02-28 DIAGNOSIS — R188 Other ascites: Secondary | ICD-10-CM | POA: Diagnosis present

## 2021-02-28 DIAGNOSIS — R52 Pain, unspecified: Secondary | ICD-10-CM

## 2021-02-28 DIAGNOSIS — D72829 Elevated white blood cell count, unspecified: Secondary | ICD-10-CM | POA: Diagnosis present

## 2021-02-28 DIAGNOSIS — I2510 Atherosclerotic heart disease of native coronary artery without angina pectoris: Secondary | ICD-10-CM | POA: Diagnosis present

## 2021-02-28 DIAGNOSIS — Z79899 Other long term (current) drug therapy: Secondary | ICD-10-CM

## 2021-02-28 DIAGNOSIS — D631 Anemia in chronic kidney disease: Secondary | ICD-10-CM | POA: Diagnosis present

## 2021-02-28 DIAGNOSIS — Z86718 Personal history of other venous thrombosis and embolism: Secondary | ICD-10-CM

## 2021-02-28 DIAGNOSIS — Z681 Body mass index (BMI) 19 or less, adult: Secondary | ICD-10-CM | POA: Diagnosis not present

## 2021-02-28 DIAGNOSIS — E876 Hypokalemia: Secondary | ICD-10-CM | POA: Diagnosis present

## 2021-02-28 DIAGNOSIS — I5031 Acute diastolic (congestive) heart failure: Secondary | ICD-10-CM | POA: Diagnosis not present

## 2021-02-28 DIAGNOSIS — Z89512 Acquired absence of left leg below knee: Secondary | ICD-10-CM

## 2021-02-28 DIAGNOSIS — R609 Edema, unspecified: Secondary | ICD-10-CM

## 2021-02-28 DIAGNOSIS — R680 Hypothermia, not associated with low environmental temperature: Secondary | ICD-10-CM | POA: Diagnosis present

## 2021-02-28 DIAGNOSIS — R571 Hypovolemic shock: Secondary | ICD-10-CM | POA: Diagnosis not present

## 2021-02-28 DIAGNOSIS — E7800 Pure hypercholesterolemia, unspecified: Secondary | ICD-10-CM | POA: Diagnosis present

## 2021-02-28 DIAGNOSIS — E039 Hypothyroidism, unspecified: Secondary | ICD-10-CM | POA: Diagnosis present

## 2021-02-28 DIAGNOSIS — E785 Hyperlipidemia, unspecified: Secondary | ICD-10-CM | POA: Diagnosis present

## 2021-02-28 DIAGNOSIS — R627 Adult failure to thrive: Secondary | ICD-10-CM | POA: Diagnosis present

## 2021-02-28 DIAGNOSIS — I493 Ventricular premature depolarization: Secondary | ICD-10-CM | POA: Diagnosis present

## 2021-02-28 DIAGNOSIS — M7981 Nontraumatic hematoma of soft tissue: Secondary | ICD-10-CM | POA: Diagnosis present

## 2021-02-28 DIAGNOSIS — R5381 Other malaise: Secondary | ICD-10-CM | POA: Diagnosis not present

## 2021-02-28 DIAGNOSIS — I959 Hypotension, unspecified: Secondary | ICD-10-CM | POA: Diagnosis present

## 2021-02-28 DIAGNOSIS — Z20822 Contact with and (suspected) exposure to covid-19: Secondary | ICD-10-CM | POA: Diagnosis present

## 2021-02-28 DIAGNOSIS — I953 Hypotension of hemodialysis: Secondary | ICD-10-CM | POA: Diagnosis present

## 2021-02-28 DIAGNOSIS — D62 Acute posthemorrhagic anemia: Secondary | ICD-10-CM | POA: Diagnosis present

## 2021-02-28 DIAGNOSIS — G7289 Other specified myopathies: Secondary | ICD-10-CM | POA: Diagnosis not present

## 2021-02-28 DIAGNOSIS — Z982 Presence of cerebrospinal fluid drainage device: Secondary | ICD-10-CM

## 2021-02-28 DIAGNOSIS — N186 End stage renal disease: Secondary | ICD-10-CM | POA: Diagnosis not present

## 2021-02-28 DIAGNOSIS — T801XXA Vascular complications following infusion, transfusion and therapeutic injection, initial encounter: Secondary | ICD-10-CM | POA: Diagnosis present

## 2021-02-28 DIAGNOSIS — M7989 Other specified soft tissue disorders: Secondary | ICD-10-CM | POA: Diagnosis not present

## 2021-02-28 DIAGNOSIS — F17210 Nicotine dependence, cigarettes, uncomplicated: Secondary | ICD-10-CM | POA: Diagnosis present

## 2021-02-28 DIAGNOSIS — R64 Cachexia: Secondary | ICD-10-CM | POA: Diagnosis present

## 2021-02-28 DIAGNOSIS — R4182 Altered mental status, unspecified: Secondary | ICD-10-CM | POA: Diagnosis not present

## 2021-02-28 DIAGNOSIS — I132 Hypertensive heart and chronic kidney disease with heart failure and with stage 5 chronic kidney disease, or end stage renal disease: Secondary | ICD-10-CM | POA: Diagnosis present

## 2021-02-28 DIAGNOSIS — R578 Other shock: Secondary | ICD-10-CM | POA: Diagnosis present

## 2021-02-28 DIAGNOSIS — I480 Paroxysmal atrial fibrillation: Secondary | ICD-10-CM | POA: Diagnosis present

## 2021-02-28 DIAGNOSIS — I252 Old myocardial infarction: Secondary | ICD-10-CM

## 2021-02-28 DIAGNOSIS — Z955 Presence of coronary angioplasty implant and graft: Secondary | ICD-10-CM

## 2021-02-28 DIAGNOSIS — Z7189 Other specified counseling: Secondary | ICD-10-CM | POA: Diagnosis not present

## 2021-02-28 DIAGNOSIS — Z880 Allergy status to penicillin: Secondary | ICD-10-CM

## 2021-02-28 DIAGNOSIS — Z89511 Acquired absence of right leg below knee: Secondary | ICD-10-CM

## 2021-02-28 DIAGNOSIS — Z90710 Acquired absence of both cervix and uterus: Secondary | ICD-10-CM

## 2021-02-28 DIAGNOSIS — R638 Other symptoms and signs concerning food and fluid intake: Secondary | ICD-10-CM | POA: Diagnosis not present

## 2021-02-28 DIAGNOSIS — E875 Hyperkalemia: Secondary | ICD-10-CM | POA: Diagnosis not present

## 2021-02-28 DIAGNOSIS — R54 Age-related physical debility: Secondary | ICD-10-CM | POA: Diagnosis present

## 2021-02-28 LAB — COMPREHENSIVE METABOLIC PANEL
ALT: 8 U/L (ref 0–44)
AST: 18 U/L (ref 15–41)
Albumin: 1.5 g/dL — ABNORMAL LOW (ref 3.5–5.0)
Alkaline Phosphatase: 54 U/L (ref 38–126)
Anion gap: 14 (ref 5–15)
BUN: 15 mg/dL (ref 6–20)
CO2: 26 mmol/L (ref 22–32)
Calcium: 9 mg/dL (ref 8.9–10.3)
Chloride: 94 mmol/L — ABNORMAL LOW (ref 98–111)
Creatinine, Ser: 4.37 mg/dL — ABNORMAL HIGH (ref 0.44–1.00)
GFR, Estimated: 11 mL/min — ABNORMAL LOW (ref >60)
Glucose, Bld: 121 mg/dL — ABNORMAL HIGH (ref 70–99)
Potassium: 3.4 mmol/L — ABNORMAL LOW (ref 3.5–5.1)
Sodium: 134 mmol/L — ABNORMAL LOW (ref 135–145)
Total Bilirubin: 0.7 mg/dL (ref 0.3–1.2)
Total Protein: 4.7 g/dL — ABNORMAL LOW (ref 6.5–8.1)

## 2021-02-28 LAB — CBC WITH DIFFERENTIAL/PLATELET
Abs Immature Granulocytes: 0.05 10*3/uL (ref 0.00–0.07)
Basophils Absolute: 0 10*3/uL (ref 0.0–0.1)
Basophils Relative: 0 %
Eosinophils Absolute: 0 10*3/uL (ref 0.0–0.5)
Eosinophils Relative: 0 %
HCT: 25.5 % — ABNORMAL LOW (ref 36.0–46.0)
Hemoglobin: 7.4 g/dL — ABNORMAL LOW (ref 12.0–15.0)
Immature Granulocytes: 1 %
Lymphocytes Relative: 27 %
Lymphs Abs: 1.3 10*3/uL (ref 0.7–4.0)
MCH: 25.3 pg — ABNORMAL LOW (ref 26.0–34.0)
MCHC: 29 g/dL — ABNORMAL LOW (ref 30.0–36.0)
MCV: 87.3 fL (ref 80.0–100.0)
Monocytes Absolute: 0.2 10*3/uL (ref 0.1–1.0)
Monocytes Relative: 5 %
Neutro Abs: 3.3 10*3/uL (ref 1.7–7.7)
Neutrophils Relative %: 67 %
Platelets: 155 10*3/uL (ref 150–400)
RBC: 2.92 MIL/uL — ABNORMAL LOW (ref 3.87–5.11)
RDW: 19.3 % — ABNORMAL HIGH (ref 11.5–15.5)
WBC: 4.9 10*3/uL (ref 4.0–10.5)
nRBC: 0 % (ref 0.0–0.2)

## 2021-02-28 LAB — RESP PANEL BY RT-PCR (FLU A&B, COVID) ARPGX2
Influenza A by PCR: NEGATIVE
Influenza B by PCR: NEGATIVE
SARS Coronavirus 2 by RT PCR: NEGATIVE

## 2021-02-28 LAB — GLUCOSE, CAPILLARY
Glucose-Capillary: 77 mg/dL (ref 70–99)
Glucose-Capillary: 97 mg/dL (ref 70–99)

## 2021-02-28 LAB — TROPONIN I (HIGH SENSITIVITY)
Troponin I (High Sensitivity): 42 ng/L — ABNORMAL HIGH (ref <18)
Troponin I (High Sensitivity): 39 ng/L — ABNORMAL HIGH (ref <18)

## 2021-02-28 LAB — PREPARE RBC (CROSSMATCH)

## 2021-02-28 LAB — LACTIC ACID, PLASMA
Lactic Acid, Venous: 8.1 mmol/L (ref 0.5–1.9)
Lactic Acid, Venous: 8.7 mmol/L (ref 0.5–1.9)

## 2021-02-28 LAB — MRSA PCR SCREENING: MRSA by PCR: NEGATIVE

## 2021-02-28 LAB — PROTIME-INR
INR: 1.3 — ABNORMAL HIGH (ref 0.8–1.2)
Prothrombin Time: 16.3 seconds — ABNORMAL HIGH (ref 11.4–15.2)

## 2021-02-28 MED ORDER — SODIUM CHLORIDE 0.9 % IV SOLN
2.0000 g | Freq: Once | INTRAVENOUS | Status: AC
  Administered 2021-02-28: 2 g via INTRAVENOUS
  Filled 2021-02-28: qty 2

## 2021-02-28 MED ORDER — VANCOMYCIN HCL IN DEXTROSE 500-5 MG/100ML-% IV SOLN: 500.0000 mg | INTRAVENOUS | Status: DC

## 2021-02-28 MED ORDER — DOCUSATE SODIUM 100 MG PO CAPS: 100.0000 mg | ORAL_CAPSULE | Freq: Two times a day (BID) | ORAL | Status: DC | PRN

## 2021-02-28 MED ORDER — METRONIDAZOLE 500 MG/100ML IV SOLN
500.0000 mg | Freq: Once | INTRAVENOUS | Status: AC
  Administered 2021-02-28: 500 mg via INTRAVENOUS
  Filled 2021-02-28: qty 100

## 2021-02-28 MED ORDER — NOREPINEPHRINE 4 MG/250ML-% IV SOLN
2.0000 ug/min | INTRAVENOUS | Status: DC
  Administered 2021-02-28: 2 ug/min via INTRAVENOUS
  Administered 2021-03-01: 6 ug/min via INTRAVENOUS
  Filled 2021-02-28 (×2): qty 250

## 2021-02-28 MED ORDER — SODIUM CHLORIDE 0.9 % IV BOLUS
500.0000 mL | Freq: Once | INTRAVENOUS | Status: AC
  Administered 2021-02-28: 500 mL via INTRAVENOUS

## 2021-02-28 MED ORDER — SODIUM CHLORIDE 0.9 % IV SOLN
1.0000 g | INTRAVENOUS | Status: DC
  Filled 2021-02-28: qty 1

## 2021-02-28 MED ORDER — SODIUM CHLORIDE 0.9 % IV SOLN: 250.0000 mL | INTRAVENOUS | Status: DC

## 2021-02-28 MED ORDER — CALCIUM GLUCONATE-NACL 1-0.675 GM/50ML-% IV SOLN
1.0000 g | Freq: Once | INTRAVENOUS | Status: AC
  Administered 2021-02-28: 1000 mg via INTRAVENOUS
  Filled 2021-02-28: qty 50

## 2021-02-28 MED ORDER — VANCOMYCIN HCL 1000 MG/200ML IV SOLN
1000.0000 mg | Freq: Once | INTRAVENOUS | Status: AC
  Administered 2021-02-28: 1000 mg via INTRAVENOUS
  Filled 2021-02-28: qty 200

## 2021-02-28 MED ORDER — POLYETHYLENE GLYCOL 3350 17 G PO PACK: 17.0000 g | PACK | Freq: Every day | ORAL | Status: DC | PRN

## 2021-02-28 MED ORDER — SODIUM CHLORIDE 0.9% IV SOLUTION: Freq: Once | INTRAVENOUS | Status: DC

## 2021-02-28 MED ORDER — ONDANSETRON HCL 4 MG/2ML IJ SOLN: 4.0000 mg | Freq: Four times a day (QID) | INTRAMUSCULAR | Status: DC | PRN

## 2021-02-28 MED ORDER — LACTATED RINGERS IV SOLN: INTRAVENOUS | Status: DC

## 2021-02-28 NOTE — ED Provider Notes (Signed)
[facility] EMERGENCY DEPARTMENT Provider Note   CSN: [id number] Arrival date & time: 02/28/21  1336     History Chief Complaint  Patient presents with  . Bleeding/Bruising    [name] is a 60 y.o. female.  HPI  Presents from dialysis with altered mental status.  History obtained from EMS that the patient is not responsive.  EMS states that her fistula was stuck in the right and she received about 30 seconds of dialysis before realizing that she was infiltrating into the right side of her chest and her right arm.  They noted what they thought was a seizure and the patient became minimally responsive.  EMS came and found the bagging the patient.  Her fistula was bleeding although EMS states that there was not a lot of blood on scene and they applied a tourniquet clamp and brought her here. Unable to obtain a blood pressure; pt will spontaneously open eyes from time to time.  Level 5 caveat applies    Past Medical History:  Diagnosis Date  . Anterior cerebral artery aneurysm    1996 AT [facility]  . CAD (coronary artery disease)    a. 2009 for NSTEMI with 70% dLAD, 30% mCx, 50% dCx, 95% dRCA, 40% PLA -> RCA was treated with DES.  . Diastolic heart failure    LVEF 60-65% with grade 2 diastolic dysfunction - July 2014  . Encephalopathy   . ESRD (end stage renal disease) on dialysis ([place])   . HTN (hypertension)    Resistant  . Hypercholesterolemia   . PAF (paroxysmal atrial fibrillation) ([place]) 02/03/2019  . Poor historian   . Seizures ([place])   . 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 Subarachnoid hemorrhage ([place])    03/2012  . Tobacco abuse    Resumed smoking half a pack a day. She was a smoker in the past and states she was abl eto stop in the past using nicotine patches    Patient Active Problem List   Diagnosis Date Noted  . Pressure injury of skin 11/29/2020  . Pleural effusion 11/28/2020  . Subdural fluid collection 11/27/2020  . Left wrist fracture 11/27/2020   . Leg DVT (deep venous thromboembolism), acute, bilateral ([place]) 11/27/2020  . Hyperthyroidism 11/27/2020  . Malnutrition of moderate degree 11/20/2020  . Epigastric abdominal pain 11/08/2020  . NSTEMI (non-ST elevated myocardial infarction) ([place]) 10/16/2020  . AMS (altered mental status) 10/06/2020  . Elevated troponin 10/06/2020  . Headache, unspecified 11/28/2019  . Hypercalcemia 11/10/2019  . Allergy, unspecified, initial encounter 08/08/2019  . Hypokalemia 07/07/2019  . Sepsis, unspecified organism ([place]) 02/03/2019  . End-stage renal disease on hemodialysis ([place]) 02/03/2019  . Unspecified atrial fibrillation ([place]) 02/03/2019  . Hypotension 02/02/2019  . Syncope and collapse   . Goals of care, counseling/discussion   . Atrial fibrillation with rapid ventricular response ([place]) 03/20/2018  . Chills (without fever) 03/16/2018  . Iron deficiency anemia, unspecified 02/04/2018  . 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 Anemia in chronic kidney disease 12/30/2017  . Fever, unspecified 12/30/2017  . Generalized abdominal pain 12/30/2017  . Heart failure, unspecified ([place]) 12/30/2017  . Other specified personal risk factors, not elsewhere classified 12/30/2017  . Pure hypercholesterolemia, unspecified 12/30/2017  . Secondary hyperparathyroidism of renal origin ([place]) 12/30/2017  . Shortness of breath 12/30/2017  . History of embolic stroke [id number]  . Altered mental status 11/08/2013  . HTN (hypertension) 11/08/2013  . Hypertensive renal disease 11/06/2013  . Protein-calorie malnutrition, severe ([place]) 11/04/2013  .  Seizure disorder ([place]) 10/22/2013  . Hypertensive urgency 10/20/2013  . Hypertensive heart disease 05/07/2013  . Acute-on-chronic renal failure ([place]) 05/07/2013  . HTN (hypertension), malignant 05/06/2013  . CAD (coronary artery disease) 05/06/2013  . ESRD (end stage renal disease) ([place]) 05/06/2013  . Chronic diastolic heart failure ([place])   . Tobacco abuse  09/25/2012  . H/O noncompliance with medical treatment, presenting hazards to health   . Renal artery stenosis ([place]) 04/05/2012  . History of subarachnoid hemorrhage 03/03/2012  . Mixed hyperlipidemia 09/19/2008  . Acute on chronic diastolic heart failure ([place]) 09/19/2008    Past Surgical History:  Procedure Laterality Date  . ABDOMINAL HYSTERECTOMY    . BASCILIC VEIN TRANSPOSITION Right 05/13/2013   Procedure: [place];  Surgeon: Rosetta Posner, MD;  Location: [facility];  Service: Vascular;  Laterality: Right;  . BRAIN SURGERY     2   . INSERTION OF DIALYSIS CATHETER Right 10/15/2020   Procedure: INSERTION OF TUNNELED DIALYSIS CATHETER;  Surgeon: Cherre Robins, MD;  Location: [facility] OR;  Service: Vascular;  Laterality: Right;  . IR THORACENTESIS ASP PLEURAL SPACE W/IMG GUIDE  11/26/2020  . LOOP RECORDER IMPLANT N/A 11/14/2013   Procedure: LOOP RECORDER IMPLANT;  Surgeon: Evans Lance, MD;  Location: [facility] CATH LAB;  Service: Cardiovascular;  Laterality: N/A;  . OPEN REDUCTION INTERNAL FIXATION (ORIF) DISTAL RADIAL FRACTURE Left 11/15/2020   Procedure: OPEN REDUCTION INTERNAL FIXATION (ORIF) DISTAL RADIAL FRACTURE;  Surgeon: Verner Mould, MD;  Location: [facility];  Service: Orthopedics;  Laterality: Left;  . REVISON OF ARTERIOVENOUS FISTULA Right 10/15/2020   Procedure: REVISON OF ARTERIOVENOUS FISTULA ARM;  Surgeon: Cherre Robins, MD;  Location: [facility];  Service: Vascular;  Laterality: Right;  . TEE WITHOUT CARDIOVERSION N/A 11/14/2013   Procedure: TRANSESOPHAGEAL ECHOCARDIOGRAM (TEE);  Surgeon: Candee Furbish, MD;  Location: [facility] ENDOSCOPY;  Service: Cardiovascular;  Laterality: N/A;  . VENTRICULOPERITONEAL SHUNT  03/27/2012   Procedure: SHUNT INSERTION VENTRICULAR-PERITONEAL;  Surgeon: Winfield Cunas, MD;  Location: [facility] NEURO ORS;  Service: Neurosurgery;  Laterality: N/A;  Ventricular-Peritoneal Shunt Insertion     OB History   No obstetric history on file.     Family  History  Problem Relation Age of Onset  . Heart disease Mother   . Heart disease Father   . Coronary artery disease Other        Premature in 1st degree relatives    Social History   Tobacco Use  . Smoking status: Former Smoker    Packs/day: 0.20    Years: 35.00    Pack years: 7.00    Types: Cigarettes    Quit date: 03/20/2017    Years since quitting: 3.9  . Smokeless tobacco: Never Used  . Tobacco comment: 1/2 ppd   Vaping Use  . Vaping Use: Never used  Substance Use Topics  . Alcohol use: No  . Drug use: No    Home Medications Prior to Admission medications   Medication Sig Start Date End Date Taking? Authorizing Provider  amiodarone (PACERONE) 200 MG tablet Take 1 tablet (200 mg total) by mouth daily. 11/27/20   Mariel Aloe, MD  aspirin 81 MG EC tablet Take 81 mg by mouth every 8 (eight) hours as needed for pain. 12/31/17   [provider]  calcitRIOL (ROCALTROL) 0.5 MCG capsule Take 1 capsule (0.5 mcg total) by mouth Every Tuesday,Thursday,and Saturday with dialysis. 02/05/19   Hongalgi, Lenis Dickinson, MD  calcium acetate (PHOSLO) 667 MG  capsule Take 667 mg by mouth 3 (three) times daily. 11/28/19   [provider]  levETIRAcetam (KEPPRA) 500 MG tablet Take 1 tablet (500 mg total) by mouth 2 (two) times daily. 11/27/20   Mariel Aloe, MD  metoprolol tartrate (LOPRESSOR) 25 MG tablet Take 1 tablet (25 mg total) by mouth 2 (two) times daily. 11/27/20   Mariel Aloe, MD  midodrine (PROAMATINE) 10 MG tablet Take 1 tablet (10 mg total) by mouth 2 (two) times daily with a meal. 11/27/20   Mariel Aloe, MD  multivitamin (RENA-VIT) TABS tablet Take 1 tablet by mouth at bedtime. 11/27/20   Mariel Aloe, MD  nitroGLYCERIN (NITROSTAT) 0.4 MG SL tablet Place 1 tablet (0.4 mg total) under the tongue every 5 (five) minutes as needed for chest pain. 05/18/13   Janece Canterbury, MD  Nutritional Supplements (FEEDING SUPPLEMENT, NEPRO CARB STEADY,) LIQD Take 237 mLs by mouth 3  (three) times daily between meals. 11/27/20   Mariel Aloe, MD  pantoprazole (PROTONIX) 40 MG tablet Take 1 tablet (40 mg total) by mouth daily. 11/27/20   Mariel Aloe, MD  polyethylene glycol (MIRALAX / GLYCOLAX) 17 g packet Take 17 g by mouth 2 (two) times daily. 11/27/20   Mariel Aloe, MD  rosuvastatin (CRESTOR) 10 MG tablet Take 10 mg by mouth daily. 10/16/20   [provider]    Allergies    Amoxicillin and Penicillins  Review of Systems   Review of Systems  Unable to perform ROS: Acuity of condition    Physical Exam Updated Vital Signs BP (!) 84/39   Temp (!) 96.1 F (35.6 C) (Temporal)   Resp (!) 21   Physical Exam Vitals and nursing note reviewed.  Constitutional:      Appearance: She is well-developed. She is ill-appearing.     Comments: obtunded  HENT:     Head: Normocephalic and atraumatic.  Eyes:     Conjunctiva/sclera: Conjunctivae normal.  Neck:     Comments: Carotid pulse palpable Cardiovascular:     Rate and Rhythm: Tachycardia present.     Heart sounds: No murmur heard.     Comments: No radial pulses Right sided fistula with palpable thrill; bleeding was controlled with clamp removed. Pulmonary:     Effort: Pulmonary effort is normal. No respiratory distress.     Breath sounds: Normal breath sounds.  Abdominal:     Palpations: Abdomen is soft.     Tenderness: There is no abdominal tenderness.  Musculoskeletal:        General: No swelling or deformity.     Cervical back: Neck supple.     Right lower leg: No edema.     Left lower leg: No edema.  Skin:    General: Skin is warm and dry.     Comments: Right sided fullness to chesst and arm c/w infiltration.   Neurological:     Comments: Obtunded Responds to pain Opens eyes to voice States name to voice  Psychiatric:     Comments: [place]     ED Results / Procedures / Treatments   Labs (all labs ordered are listed, but only abnormal results are displayed) Labs Reviewed   COMPREHENSIVE METABOLIC PANEL - Abnormal; Notable for the following components:      Result Value   Sodium 134 (*)    Potassium 3.4 (*)    Chloride 94 (*)    Glucose, Bld 121 (*)    Creatinine, Ser 4.37 (*)  Total Protein 4.7 (*)    Albumin 1.5 (*)    GFR, Estimated 11 (*)    All other components within normal limits  CBC WITH DIFFERENTIAL/PLATELET - Abnormal; Notable for the following components:   RBC 2.92 (*)    Hemoglobin 7.4 (*)    HCT 25.5 (*)    MCH 25.3 (*)    MCHC 29.0 (*)    RDW 19.3 (*)    All other components within normal limits  LACTIC ACID, PLASMA - Abnormal; Notable for the following components:   Lactic Acid, Venous 8.1 (*)    All other components within normal limits  TROPONIN I (HIGH SENSITIVITY) - Abnormal; Notable for the following components:   Troponin I (High Sensitivity) 42 (*)    All other components within normal limits  RESP PANEL BY RT-PCR (FLU A&B, COVID) [id number]  LACTIC ACID, PLASMA  PROTIME-INR  I-STAT CHEM 8, ED  TYPE AND SCREEN  PREPARE RBC (CROSSMATCH)  TROPONIN I (HIGH SENSITIVITY)    EKG None  Radiology DG Chest 1 View  Result Date: 02/28/2021 CLINICAL DATA:  The patient became unresponsive during dialysis today. EXAM: CHEST  1 VIEW COMPARISON:  11/28/2020 FINDINGS: Stable right jugular double-lumen catheter, ventriculoperitoneal shunt tubing and enlarged cardiac silhouette. The aorta remains tortuous and partially calcified. No significant change in a moderate-sized right pleural effusion. Decreased associated right basilar atelectasis. Clear left lung. Diffuse osteopenia. IMPRESSION: 1. No acute abnormality. 2. Stable moderate-sized right pleural effusion. 3. Stable cardiomegaly and decreased right basilar atelectasis. Electronically Signed   By: Claudie Revering M.D.   On: 02/28/2021 14:43    Procedures .Critical Care Performed by: Aris Lot, MD Authorized by: Deno Etienne, DO   Critical care provider statement:    Critical  care time (minutes):  60   Critical care start time:  02/28/2021 1:40 PM   Critical care end time:  02/28/2021 2:30 PM   Critical care was necessary to treat or prevent imminent or life-threatening deterioration of the following conditions:  Circulatory failure and shock   Critical care was time spent personally by me on the following activities:  Evaluation of patient's response to treatment, examination of patient, obtaining history from patient or surrogate and re-evaluation of patient's condition   I assumed direction of critical care for this patient from another provider in my specialty: no       Medications Ordered in ED Medications  0.9 %  sodium chloride infusion (Manually program via Guardrails IV Fluids) (has no administration in time range)  sodium chloride 0.9 % bolus 500 mL (0 mLs Intravenous Stopped 02/28/21 1431)  calcium gluconate 1 g/ 50 mL sodium chloride IVPB (1,000 mg Intravenous New Bag/Given 02/28/21 1435)    ED Course  I have reviewed the triage vital signs and the nursing notes.  Pertinent labs & imaging results that were available during my care of the patient were reviewed by me and considered in my medical decision making (see chart for details).    MDM Rules/Calculators/A&P                          Pt presents in distress, in shock. HR aberrant; would vary from 100-110 then suddenly 150-170, but appeared to have p waves on evaluation of tele and on all ECGs. No STE or other ST changes or focal TWI on ECGs. Afib with RVR considered but no shocks given due to all evals having p waves, indicating sinus. Hemorrhage considered,  though no significant amount of blood on scene per EMS and R chest and arm fullness being isolated location of hemorrhage. Sepsis considered but reportedly sudden change today without h/o fever.  Her permacath was accessed and 500 cc of fluids was given and her blood pressure responded significantly.  On repeat eval her mental status is much  better.  She is still disoriented to place and time but she was able to answer basic review of systems which is pan negative; no chest pain, shortness of breath, fevers, chills, vomiting, abdominal pain, diarrhea, focal weakness.  Laboratory studies resulted; hemoglobin has dropped significantly to 7.4.  Troponin mildly elevated and reflective likely of shock state rather than primary ACS; will trend.  CMP with mild hypokalemia; calcium gluconate and given in consideration of electrolyte abnormality causing arrhythmia disturbance.  Chest x-ray shows right pleural effusion, no interstitial edema, PermCath appears in place.   At time of handoff to Dr. Miki Kins at 1500, the patient cycled another hypotensive blood pressure.  Plan is to give emergency release blood due to the symptoms that are most likely due to hemorrhage.  CT head, blood gas, lactic acid, further management pending at time of handoff.   Final Clinical Impression(s) / ED Diagnoses Final diagnoses:  None    Rx / DC Orders ED Discharge Orders    None       Aris Lot, MD 02/28/21 [place], [place], DO 03/01/21 2305

## 2021-02-28 NOTE — H&P (Signed)
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   Patient is encephalopathic, therefore history has been obtained from chart review. Per EMS, patient began dialysis and 30 seconds after cannulation, site was noted to be infiltrating into the R chest and R arm. Patient had questionable seizure activity (per HD center staff) and became unresponsive. EMS was called and began to ventilate patient with BVM. Tourniquet clamp was applied by EMS.  On arrival to ED, patient was hypotensive. Received 541m bolus via Permcath (accessed emergently). Labs were notable for Hgb 7.4 (previously ~13 three months ago), Plt 155, INR 1.3. Na 134, K 3.4, CO2 26, Cr 4.37 (on HD). Trop 42, LA 8.1. 1U PRBCs was ordered. CT Head negative for acute processes. PCCM consulted for hypotension/ICU admission. Advised urgent Vascular consult to assess AVF.  Of note, underwent revision of RUE AVF 12/27 with VVS (Dr. HStanford Breed during which two areas of aneurysmal degradation were excised with subsequent end-to-end reconstruction. A Permcath was placed at that time to allow for HD access during healing.  Pertinent Medical History:   Past Medical History:  Diagnosis Date  . [facility]    [facility] CAD (coronary artery disease)    a. 2009 for NSTEMI with 70% dLAD, 30% mCx, 50% dCx, 95% dRCA, 40% PLA -> RCA was treated with DES.  . Diastolic heart failure    LVEF 60-65% with grade 2 diastolic dysfunction - July 2014  . Encephalopathy   . ESRD (end stage renal disease) [place] dialysis [place]   . HTN (hypertension)     Resistant  . Hypercholesterolemia   . PA[place]oxysmal [place]) ([place] 02/03/2019  . Poor historian   . [place]izures ([place]   . [place]  . [place]achnoid hemorrhage ([place]    03/2012  . Tobacco abuse    Resumed smoking half a pack a day. She was a smoker in the past and states she was abl eto stop in the past using nicotine patches   Significant Hospital Events: Including procedures, antibiotic start and stop dates in addition to other pertinent events   . [facility] after HD cannulation site infiltrated (R AVF into R chest). Unresponsive x 5 minutes in HD, required BVM ventilation. Hgb 7.4 in ED, 1U PRBCs ordered. PCCM consult for admission. Vascular consult.  Interim History / Subjective:    Objective:  Blood pressure (!) 63/35, pulse 92, temperature (!) 96.4 F (35.8 C), temperature source Temporal, resp. rate 17.       No intake or output data in the 24 hours ending 02/28/21 1734 There were no vitals filed for this visit.  Physical Examination: General: Chronically ill-appearing female in NAD. Cachectic. HEENT: [place]/AT, anicteric sclera, PERRL, moist mucous membranes. Neuro: Drowsy, opens eyes to voice. Grunting to questions. Responds to verbal stimuli. Following commands consistently.  CV: Irregularly irregular, no m/g/r. PULM: Breathing even and unlabored. Lung fields CTAB anteriorly. GI: Soft, nontender, nondistended. Normoactive bowel sounds. Extremities: No LE edema noted. Significant muscle wasting noted. RUE  AVF with fresh scabbing noted, no active bleeding. RUE swelling noted. No radial pulse palpable on R, +radial pulse on L. Skin: Cool/dry, no rashes.  Labs/imaging that I have personally reviewed: (right click and "Reselect all SmartList Selections" daily)  Hgb 7.4 (previously ~13 three months ago) Plt 155 INR 1.3  Na 134, K 3.4, CO2 26, Cr 4.37 (on HD)  Trop 42 LA 8.1  CXR without acute  cardiopulmonary process, stable moderate R pleural effusion, stable cardiomegaly and R basilar atelectasis  CT Head stable position of VP shunt with stable ventricular enlargement, multiple prior infarcts (largest L parietal lobe), no acute infarct evident; no mass/hemorrhage or appreciable fluid collection  Resolved Hospital Problem List:     Assessment & Plan:  Hypovolemic shock likely hemorrhagic  - Patient suffered right fistula infiltration at outpatient HD that resulted in significant arm swelling that tracked up to chest concerning for severe bleeding.  - Can not at this time rule out septic shock as well  P: Aggressive resuscitation to hemodynamic stability Emergent blood transfusion now  Continue pressor support for MAP goal of > 65 Trend CBC  Consult vascular surgery for fistula evaluation  NPO  Maintain 2 large bore PIVs, may need central access depending on pressor need  May need A-line for monitoring of hemodynamics Hold any anticoagulation/NSAIDs  Pan cultures prior to antibiotic Empiric broad spectrum antibiotics  Trend lactic acid Procalcitonin  Acute blood loss anemia secondary to above  - Hgb was 13.6 11/29/2020, on admission hgb dropped to 7.4 P: Emergent blood as above Trend CBC  Hgb goal greater than 7 Transfuse per protocol    ESRD on iHD  P: Consult nephrology  Further iHD per nephrology  Follow renal function / urine output Trend Bmet Avoid nephrotoxins Ensure adequate renal perfusion Continue home renal supplements   Prior stroke Hx of seizures  -Home medications include Keppra  P: Management per neurology  Maintain neuro protective measures; goal for eurothermia, euglycemia, eunatermia, normoxia, and PCO2 goal of 35-40 Nutrition and bowel regiment  Seizure precautions  Continue home Keppra Aspirations precautions   Hx of paroxysmal A-fib - Home medications include Amiodarone and lopressor  Hx of diastolic CHF - ECHO with EF  60-65% and grade II diastolic dysfunction  Hx of CAD with prior NSTEMI and s/p DES Hx of HTN/HLD P: Continuous telemetry  Continue home amiodarone  Hold BB in the setting of hypotension Continue home statin  Supportive care   Hypokalemia  - Potassium 3.4 on arrival  P: Trend Bmet  Supplement as needed   Best Practice: (right click and "Reselect all SmartList Selections" daily)  Diet:  NPO Pain/Anxiety/Delirium protocol (if indicated): No VAP protocol (if indicated): Not indicated DVT prophylaxis: Contraindicated GI prophylaxis: N/A Glucose control:  SSI No Central venous access:  Yes, and it is still needed - Permcath Arterial line:  N/A Foley:  N/A Mobility:  bed rest  PT consulted: N/A Last date of multidisciplinary goals of care discussion [Pending] Code Status:  full code Disposition: ICU  Labs:   CBC: Recent Labs  Lab 02/28/21 1406  WBC 4.9  NEUTROABS 3.3  HGB 7.4*  HCT 25.5*  MCV 87.3  PLT 99991111    Basic Metabolic Panel: Recent Labs  Lab 02/28/21 1406  NA 134*  K 3.4*  CL 94*  CO2 26  GLUCOSE 121*  BUN 15  CREATININE 4.37*  CALCIUM 9.0   GFR: CrCl cannot be calculated (Unknown ideal weight.). Recent Labs  Lab 02/28/21 1406  WBC 4.9  LATICACIDVEN 8.1*    Liver Function Tests: Recent Labs  Lab 02/28/21 1406  AST 18  ALT 8  ALKPHOS 54  BILITOT 0.7  PROT 4.7*  ALBUMIN 1.5*   No results for input(s): LIPASE, AMYLASE in the last 168 hours. No results for input(s): AMMONIA in the last 168 hours.  ABG:    Component Value Date/Time   PHART 7.429 11/09/2013 0335   PCO2ART 38.1 11/09/2013 0335   PO2ART 80.1 11/09/2013 0335   HCO3 15.8 (L) 11/15/2020 1413   TCO2 31 11/28/2020 2027   ACIDBASEDEF 9.0 (H) 11/15/2020 1413   O2SAT 84.0 11/15/2020 1413     Coagulation Profile: No results for input(s): INR, PROTIME in the last 168 hours.  Cardiac Enzymes: No results for input(s): CKTOTAL, CKMB, CKMBINDEX, TROPONINI in the last 168  hours.  HbA1C: Hgb A1c MFr Bld  Date/Time Value Ref Range Status  10/07/2020 02:05 AM 5.2 4.8 - 5.6 % Final    Comment:    (NOTE) Pre diabetes:          5.7%-6.4%  Diabetes:              >6.4%  Glycemic control for   <7.0% adults with diabetes   11/09/2013 04:33 AM 5.9 (H) <5.7 % Final    Comment:    (NOTE)                                                                       According to the ADA Clinical Practice Recommendations for 2011, when HbA1c is used as a screening test:  >=6.5%   Diagnostic of Diabetes Mellitus           (if abnormal result is confirmed) 5.7-6.4%   Increased risk of developing Diabetes Mellitus References:Diagnosis and Classification of Diabetes Mellitus,Diabetes [id number] 1):S62-S69 and Standards of Medical Care in         Diabetes - 2011,Diabetes [id number] (Suppl 1):S11-S61.    CBG: No results for input(s): GLUCAP in the last 168 hours.  Review of Systems:   Patient is encephalopathic and/or intubated. Therefore history has been obtained from chart review.   Past Medical History:  She,  has a past medical history of Anterior cerebral artery aneurysm, CAD (coronary artery disease), Diastolic heart failure, Encephalopathy, ESRD (end stage renal disease) on dialysis ([place]), HTN (hypertension), Hypercholesterolemia, PAF (paroxysmal atrial fibrillation) ([place]) (02/03/2019), Poor historian, Seizures ([place]), Stroke (cerebrum) ([place]) (2015), Subarachnoid hemorrhage ([place]), and Tobacco abuse.   Surgical History:   Past Surgical History:  Procedure Laterality Date  . ABDOMINAL HYSTERECTOMY    . BASCILIC VEIN TRANSPOSITION Right 05/13/2013   Procedure: [place];  Surgeon: Rosetta Posner, MD;  Location: [place];  Service: Vascular;  Laterality: Right;  . BRAIN SURGERY     2   . INSERTION OF DIALYSIS CATHETER Right 10/15/2020   Procedure: INSERTION OF TUNNELED DIALYSIS CATHETER;  Surgeon: Cherre Robins, MD;  Location: [facility] OR;   Service: Vascular;  Laterality: Right;  . IR THORACENTESIS ASP PLEURAL SPACE W/IMG GUIDE  11/26/2020  . LOOP RECORDER IMPLANT N/A 11/14/2013   Procedure: LOOP RECORDER IMPLANT;  Surgeon: Evans Lance, MD;  Location[facility] C[facility] LAB;  Service: Cardiovascular;  Laterality: N/A;  .  OPEN REDUCTION INTERNAL FIXATION (ORIF) DISTAL RADIAL FRACTURE Left 11/15/2020   Procedure: OPEN REDUCTION INTERNAL FIXATION (ORIF) DISTAL RADIAL FRACTURE;  Surgeon: Verner Mould, MD;  Location: [place];  Service: Orthopedics;  Laterality: Left;  . REVISON OF ARTERIOVENOUS FISTULA Right 10/15/2020   Procedure: REVISON OF ARTERIOVENOUS FISTULA ARM;  Surgeon: Cherre Robins, MD;  Location: [place];  Service: Vascular;  Laterality: Right;  . TEE WITHOUT CARDIOVERSION N/A 11/14/2013   Procedure: TRANSESOPHAGEAL ECHOCARDIOGRAM (TEE);  Surgeon: Candee Furbish, MD;  [facility] END[facility]COPY;  Service: Cardiovascular;  Laterality: N/A;  . VENTRICULOPERITONEAL SHUNT  03/27/2012   Procedure: SHUNT INSERTION VENTRICULAR-PERITONEAL;  Surgeon: Winfield Cunas, MD;  Location: [place] NEURO ORS;  Service: Neurosurgery;  Laterality: N/A;  Ventricular-Peritoneal Shunt Insertion     Social History:   reports that she quit smoking about 3 years ago. Her smoking use included cigarettes. She has a 7.00 pack-year smoking history. She has never used smokeless tobacco. She reports that she does not drink alcohol and does not use drugs.   Family History:  Her family history includes Coronary artery disease in an other family member; Heart disease in her father and mother.   Allergies Allergies  Allergen Reactions  . Amoxicillin Hives and Rash    Informed by pt's daughter [name]   . Penicillins Hives and Rash    Has patient had a PCN reaction causing immediate rash, facial/tongue/throat swelling, SOB or lightheadedness with hypotension: Yes Has patient had a PCN reaction causing severe rash involving mucus membranes or skin necrosis: No Has  patient had a PCN reaction that required hospitalization: No Has patient had a PCN reaction occurring within the last 10 years: No If all of the above answers are "NO", then may proceed with Cephalosporin use. PATIENT TOLERATED CEFAZOLIN IN OR 11/15/20      Home Medications  Prior to Admission medications   Medication Sig Start Date End Date Taking? Authorizing Provider  amiodarone (PACERONE) 200 MG tablet Take 1 tablet (200 mg total) by mouth daily. 11/27/20   Mariel Aloe, MD  aspirin 81 MG EC tablet Take 81 mg by mouth every 8 (eight) hours as needed for pain. 12/31/17   [provider]  calcitRIOL (ROCALTROL) 0.5 MCG capsule Take 1 capsule (0.5 mcg total) by mouth Every Tuesday,Thursday,and Saturday with dialysis. 02/05/19   Hongalgi, Lenis Dickinson, MD  calcium acetate (PHOSLO) 667 MG capsule Take 667 mg by mouth 3 (three) times daily. 11/28/19   [provider]  levETIRAcetam (KEPPRA) 500 MG tablet Take 1 tablet (500 mg total) by mouth 2 (two) times daily. 11/27/20   Mariel Aloe, MD  metoprolol tartrate (LOPRESSOR) 25 MG tablet Take 1 tablet (25 mg total) by mouth 2 (two) times daily. 11/27/20   Mariel Aloe, MD  midodrine (PROAMATINE) 10 MG tablet Take 1 tablet (10 mg total) by mouth 2 (two) times daily with a meal. 11/27/20   Mariel Aloe, MD  multivitamin (RENA-VIT) TABS tablet Take 1 tablet by mouth at bedtime. 11/27/20   Mariel Aloe, MD  nitroGLYCERIN (NITROSTAT) 0.4 MG SL tablet Place 1 tablet (0.4 mg total) under the tongue every 5 (five) minutes as needed for chest pain. 05/18/13   Janece Canterbury, MD  Nutritional Supplements (FEEDING SUPPLEMENT, NEPRO CARB STEADY,) LIQD Take 237 mLs by mouth 3 (three) times daily between meals. 11/27/20   Mariel Aloe, MD  pantoprazole (PROTONIX) 40 MG tablet Take 1 tablet (40 mg total) by mouth  daily. 11/27/20   Mariel Aloe, MD  polyethylene glycol (MIRALAX / GLYCOLAX) 17 g packet Take 17 g by mouth 2 (two) times daily. 11/27/20    Mariel Aloe, MD  rosuvastatin (CRESTOR) 10 MG tablet Take 10 mg by mouth daily. 10/16/20   [provider]    Critical care time: 38 minutes   Lestine Mount, PA-C [facility] 02/28/21 5:34 PM  Please see [email or web address] for pager details.  From 7A-7P if no response, please call [phone] After hours, please call E-Link [phone]

## 2021-02-28 NOTE — ED Triage Notes (Signed)
Was in HD for 30 sec. When her site infiltrated going from right fistula to the right chest. Became unresponsive for 5 mins. Requiring BMV. On EMS arrival pt was diminished lungs, bleeding uncontrolled from HD site  Lethargic EMS  reports extreme dryness and cold extremities

## 2021-02-28 NOTE — Progress Notes (Signed)
Phlebotomy attempted to collect patient blood cultures. Patient is a difficult stick with limited access, attempt was unsuccessful. Someone else will attempt per phlebotomist.

## 2021-02-28 NOTE — Progress Notes (Signed)
Notified bedside nurse of need to draw blood cultures.  

## 2021-02-28 NOTE — ED Provider Notes (Signed)
Watcher 60 year old female with history of ESRD on dialysis, here from dialysis after her fistula infiltrated Has swelling around fistula to right upper arm and chest After infiltration, became obtunded, required BVM Uncertain whether or not she had seizure activity Hypotensive for EMS and here But pressures improved after 500 cc bolus Had about six-point drop in hemoglobin since February Baseline mental status is being able to answer yes and no sometimes, mental status seems to be improving after fluid resuscitation  Plan is to follow-up CT head and chest x-ray.  Follow-up blood pressures. Physical Exam  BP (!) 136/45   Pulse 76   Temp (!) 97.3 F (36.3 C) (Axillary)   Resp 17   SpO2 100%     MDM  Upon reevaluation, vital signs improved.  Blood pressure 112/88.  Able to palpate left radial pulse and very faint right radial pulse.  Patient went to CT scan.  Came back markedly hypotensive.  Did have central pulses, but was unable to palpate left radial pulse.  Believes she did have significant drop in blood pressure.  1 unit emergency release of blood given due to concern for significant anemia.  Discussed patient with vascular surgery, they will come to evaluate fistula.  Left EJ IV catheter placed by myself for better IV access.  Lactic acid over 8.  Due to hypotension, hypothermia, and tachycardia-code sepsis initiated.  Patient given broad-spectrum antibiotics.  Blood cultures obtained.  No obvious source, patient is minimally verbal at baseline-cannot effectively rule out source of infection.  Levophed started for persistent hypotension despite starting blood.  Due to persistent hypotension, patient admitted to the ICU for close monitoring and treatment.  Was transferred to the ICU without acute change in status.       Suzan Nailer, DO 03/01/21 0007    Varney Biles, MD 03/01/21 Laureen Abrahams

## 2021-02-28 NOTE — Sepsis Progress Note (Signed)
elink is monitoring this code sepsis 

## 2021-02-28 NOTE — Progress Notes (Signed)
Pharmacy Antibiotic Note  [name] is a 60 y.o. female admitted on 02/28/2021 with AMS concerning for infection of unknown source.  Pharmacy has been consulted for cefepime and vancomycin dosing. Of note, patient has a h/o ESRD on T/T/Sat HD   WBC wnl, LA 8.1, hypothermic and hypotensive   Plan: -Cefepime 1 gm IV Q 24 hours -Vancomycin 2 gm IV once followed by vancomycin 500 mg IV QHD  -Monitor CBC, renal fx, cultures and clinical progress -Vanc levels as indicated  -F/u inpatient HD plan      Temp (24hrs), Avg:96.3 F (35.7 C), Min:96.1 F (35.6 C), Max:96.4 F (35.8 C)  Recent Labs  Lab 02/28/21 1406  WBC 4.9  CREATININE 4.37*  LATICACIDVEN 8.1*    CrCl cannot be calculated (Unknown ideal weight.).    Allergies  Allergen Reactions  . Amoxicillin Hives and Rash    Informed by pt's daughter [name]   . Penicillins Hives and Rash    Has patient had a PCN reaction causing immediate rash, facial/tongue/throat swelling, SOB or lightheadedness with hypotension: Yes Has patient had a PCN reaction causing severe rash involving mucus membranes or skin necrosis: No Has patient had a PCN reaction that required hospitalization: No Has patient had a PCN reaction occurring within the last 10 years: No If all of the above answers are "NO", then may proceed with Cephalosporin use. PATIENT TOLERATED CEFAZOLIN IN OR 11/15/20     Antimicrobials this admission: Cefepime 5/12 >>  Vancomycin 5/12 >>  Metronidazole 5/12 >>   Dose adjustments this admission:   Microbiology results: 5/12 BCx:    Thank you for allowing pharmacy to be a part of this patient's care.  Albertina Parr, PharmD., BCPS, BCCCP Clinical Pharmacist Please refer to [facility] for unit-specific pharmacist

## 2021-02-28 NOTE — Consult Note (Signed)
[facility] [place]  ASSESSMENT / PLAN: 60 y.o. female with acute blood loss anemia, severe hypotension, infiltrated right upper extremity arteriovenous fistula. I do not think the infiltration can explain the extent of anemia seen today. She has a tunneled dialysis catheter in place. Would recommend using the catheter for a period of 2 weeks until hematoma improves.   CHIEF COMPLAINT: altered mental status  HISTORY OF PRESENT ILLNESS: [name] is a 60 y.o. female arrived to [place] ER via EMS from her outpatient dialysis center. The patient is altered and cannot provide any history.   Per ER documentation: "EMS states that her fistula was stuck in the right and she received about 30 seconds of dialysis before realizing that she was infiltrating into the right side of her chest and her right arm.  They noted what they thought was a seizure and the patient became minimally responsive.  EMS came and found the bagging the patient.  Her fistula was bleeding although EMS states that there was not a lot of blood on scene and they applied a tourniquet clamp and brought her here. Unable to obtain a blood pressure; pt will spontaneously open eyes from time to time."  I performed a right upper extremity arteriovenous fistula plication for aneurysmal and ulcerated fistula 10/15/20. She was seen in the clinic 01/04/21 and found to have a well functioning fistula.   Past Medical History:  Diagnosis Date  . Anterior cerebral artery aneurysm    1996 AT [facility]  . CAD (coronary artery disease)    a. 2009 for NSTEMI with 70% dLAD, 30% mCx, 50% dCx, 95% dRCA, 40% PLA -> RCA was treated with DES.  . Diastolic heart failure    LVEF 60-65% with grade 2 diastolic dysfunction - July 2014  . Encephalopathy   . ESRD (end stage renal disease) on dialysis ([place])   . HTN (hypertension)    Resistant  . Hypercholesterolemia   . PAF (paroxysmal atrial fibrillation) ([place]) 02/03/2019  . Poor  historian   . Seizures ([place])   . 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 Subarachnoid hemorrhage ([place])    03/2012  . Tobacco abuse    Resumed smoking half a pack a day. She was a smoker in the past and states she was abl eto stop in the past using nicotine patches    Past Surgical History:  Procedure Laterality Date  . ABDOMINAL HYSTERECTOMY    . BASCILIC VEIN TRANSPOSITION Right 05/13/2013   Procedure: [place];  Surgeon: Rosetta Posner, MD;  Location: [place];  Service: Vascular;  Laterality: Right;  . BRAIN SURGERY     2   . INSERTION OF DIALYSIS CATHETER Right 10/15/2020   Procedure: INSERTION OF TUNNELED DIALYSIS CATHETER;  Surgeon: Cherre Robins, MD;  [facility];  Service: Vascular;  Laterality: Right;  . IR THORACENTESIS ASP PLEURAL SPACE W/IMG GUIDE  11/26/2020  . LOOP RECORDER IMPLANT N/A 11/14/2013   Procedure: LOOP RECORDER IMPLANT;  Surgeon: Evans Lance, MD;  Location[facility] LAB;  Service: Cardiovascular;  Laterality: N/A;  . OPEN REDUCTION INTERNAL FIXATION (ORIF) DISTAL RADIAL FRACTURE Left 11/15/2020   Procedure: OPEN REDUCTION INTERNAL FIXATION (ORIF) DISTAL RADIAL FRACTURE;  Surgeon: Verner Mould, MD;  Location: [place];  Service: Orthopedics;  Laterality: Left;  . REVISON OF ARTERIOVENOUS FISTULA Right 10/15/2020   Procedure: REVISON OF ARTERIOVENOUS FISTULA ARM;  Surgeon: Cherre Robins, MD;  Location: [place];  Service: Vascular;  Laterality: Right;  . TEE WITHOUT CARDIOVERSION N/A 11/14/2013   Procedure: TRANSESOPHAGEAL ECHOCARDIOGRAM (TEE);  Surgeon: Candee Furbish, MD;  Location: [facility] ENDOSCOPY;  Service: Cardiovascular;  Laterality: N/A;  . VENTRICULOPERITONEAL SHUNT  03/27/2012   Procedure: SHUNT INSERTION VENTRICULAR-PERITONEAL;  Surgeon: Winfield Cunas, MD;  Location: [place] NEURO ORS;  Service: Neurosurgery;  Laterality: N/A;  Ventricular-Peritoneal Shunt Insertion    Family History  Problem Relation Age of Onset  . Heart disease Mother   .  Heart disease Father   . Coronary artery disease Other        Premature in 1st degree relatives    Social History   Socioeconomic History  . Marital status: Single    Spouse name: Not on file  . Number of children: Not on file  . Years of education: Not on file  . Highest education level: Not on file  Occupational History  . Not on file  Tobacco Use  . Smoking status: Former Smoker    Packs/day: 0.20    Years: 35.00    Pack years: 7.00    Types: Cigarettes    Quit date: 03/20/2017    Years since quitting: 3.9  . Smokeless tobacco: Never Used  . Tobacco comment: 1/2 ppd   Vaping Use  . Vaping Use: Never used  Substance and Sexual Activity  . Alcohol use: No  . Drug use: No  . Sexual activity: Never  Other Topics Concern  . Not on file  Social History Narrative   Lives by herself but helps care for her 8 grandchildren.    Social Determinants of Health   Financial Resource Strain: Medium Risk  . Difficulty of Paying Living Expenses: Somewhat hard  Food Insecurity: No Food Insecurity  . Worried About Charity fundraiser in the Last Year: Never true  . Ran Out of Food in the Last Year: Never true  Transportation Needs: Unmet Transportation Needs  . Lack of Transportation (Medical): Yes  . Lack of Transportation (Non-Medical): Yes  Physical Activity: Not on file  Stress: Not on file  Social Connections: Not on file  Intimate Partner Violence: Not on file    Allergies  Allergen Reactions  . Amoxicillin Hives and Rash    Informed by pt's daughter [name]   . Penicillins Hives and Rash    Has patient had a PCN reaction causing immediate rash, facial/tongue/throat swelling, SOB or lightheadedness with hypotension: Yes Has patient had a PCN reaction causing severe rash involving mucus membranes or skin necrosis: No Has patient had a PCN reaction that required hospitalization: No Has patient had a PCN reaction occurring within the last 10 years: No If all of the  above answers are "NO", then may proceed with Cephalosporin use. PATIENT TOLERATED CEFAZOLIN IN OR 11/15/20     Current Facility-Administered Medications  Medication Dose Route Frequency Provider Last Rate Last Admin  . 0.9 %  sodium chloride infusion (Manually program via Guardrails IV Fluids)   Intravenous Once Aris Lot, MD      . 0.9 %  sodium chloride infusion  250 mL Intravenous Continuous Suzan Nailer, DO      . [START ON 03/01/2021] ceFEPIme (MAXIPIME) 1 g in sodium chloride 0.9 % 100 mL IVPB  1 g Intravenous Q24H Mancheril, Darnell Level, RPH      . ceFEPIme (MAXIPIME) 2 g in sodium chloride 0.9 % 100 mL IVPB  2 g Intravenous Once Stewardson, Delilah Shan, DO      . lactated ringers infusion  Intravenous Continuous Suzan Nailer, DO      . metroNIDAZOLE (FLAGYL) IVPB 500 mg  500 mg Intravenous Once Suzan Nailer, DO      . norepinephrine (LEVOPHED) '4mg'$  in 231m premix infusion  2-10 mcg/min Intravenous Titrated SSuzan Nailer DO 15 mL/hr at 02/28/21 1723 4 mcg/min at 02/28/21 1723  . [START ON 03/02/2021] vancomycin (VANCOCIN) IVPB 500 mg/100 ml premix  500 mg Intravenous Q T,Th,Sa-HD Mancheril, BDarnell Level RPH      . vancomycin (VANCOREADY) IVPB 1000 mg/200 mL  1,000 mg Intravenous Once SSuzan Nailer DO       Current Outpatient Medications  Medication Sig Dispense Refill  . amiodarone (PACERONE) 200 MG tablet Take 1 tablet (200 mg total) by mouth daily.    .Marland Kitchenaspirin 81 MG EC tablet Take 81 mg by mouth every 8 (eight) hours as needed for pain.    . calcitRIOL (ROCALTROL) 0.5 MCG capsule Take 1 capsule (0.5 mcg total) by mouth Every Tuesday,Thursday,and Saturday with dialysis. 15 capsule 0  . calcium acetate (PHOSLO) 667 MG capsule Take 667 mg by mouth 3 (three) times daily.    .Marland KitchenlevETIRAcetam (KEPPRA) 500 MG tablet Take 1 tablet (500 mg total) by mouth 2 (two) times daily. 180 tablet 0  . metoprolol tartrate (LOPRESSOR) 25 MG tablet Take 1 tablet (25 mg  total) by mouth 2 (two) times daily. 180 tablet 0  . midodrine (PROAMATINE) 10 MG tablet Take 1 tablet (10 mg total) by mouth 2 (two) times daily with a meal. 60 tablet 2  . multivitamin (RENA-VIT) TABS tablet Take 1 tablet by mouth at bedtime.  0  . nitroGLYCERIN (NITROSTAT) 0.4 MG SL tablet Place 1 tablet (0.4 mg total) under the tongue every 5 (five) minutes as needed for chest pain. 20 tablet 0  . Nutritional Supplements (FEEDING SUPPLEMENT, NEPRO CARB STEADY,) LIQD Take 237 mLs by mouth 3 (three) times daily between meals.    . pantoprazole (PROTONIX) 40 MG tablet Take 1 tablet (40 mg total) by mouth daily. 30 tablet 0  . polyethylene glycol (MIRALAX / GLYCOLAX) 17 g packet Take 17 g by mouth 2 (two) times daily. 14 each 0  . rosuvastatin (CRESTOR) 10 MG tablet Take 10 mg by mouth daily.      REVIEW OF SYSTEMS:  Unable to obtain - altered mental status  PHYSICAL EXAM  Vitals:   02/28/21 1553 02/28/21 1634 02/28/21 1652 02/28/21 1721  BP: (!) 87/53 112/88 (!) 53/35 (!) 63/35  Pulse:  (!) 110 92   Resp: (!) 24 14 (!) 26 17  Temp:  (!) 96.4 F (35.8 C) (!) 96.4 F (35.8 C)   TempSrc:  Temporal Temporal     Constitutional: Critically ill. Cachectic. Awake, but not oriented.  Neurologic: Not able to participate in exam. Opens eyes spontaneously. Moves extremities spontaneously. Inappropriate verbal responses.  Psychiatric: Not able to participate in exam. Eyes: No icterus. No conjunctival pallor. Ears, nose, throat: mucous membranes moist. Midline trachea.  Cardiac: regular rate and rhythm.  Respiratory: unlabored. Abdominal: soft, non-tender, non-distended.  Peripheral vascular:  Right upper extremity AVF with good thrill  Small (<1cm) area of eschar over mid fistula  Hematoma central fistula near axilla (~3x3cm)  RIJ TDC in place Extremity: No edema. No cyanosis. No pallor.  Skin: No gangrene. No ulceration.  Lymphatic: No Stemmer's sign. No palpable  lymphadenopathy.  PERTINENT LABORATORY AND RADIOLOGIC DATA  Most recent CBC CBC Latest Ref Rng & Units 02/28/2021 11/29/2020 11/28/2020  WBC 4.0 -  10.5 K/uL 4.9 9.6 -  Hemoglobin 12.0 - 15.0 g/dL 7.4(L) 13.6 13.6  Hematocrit 36.0 - 46.0 % 25.5(L) 42.2 40.0  Platelets 150 - 400 K/uL 155 220 -     Most recent CMP CMP Latest Ref Rng & Units 02/28/2021 11/29/2020 11/28/2020  Glucose 70 - 99 mg/dL 121(H) 103(H) 108(H)  BUN 6 - 20 mg/dL 15 32(H) 44(H)  Creatinine 0.44 - 1.00 mg/dL 4.37(H) 6.15(H) 5.80(H)  Sodium 135 - 145 mmol/L 134(L) 137 134(L)  Potassium 3.5 - 5.1 mmol/L 3.4(L) 4.2 5.2(H)  Chloride 98 - 111 mmol/L 94(L) 94(L) 97(L)  CO2 22 - 32 mmol/L 26 25 -  Calcium 8.9 - 10.3 mg/dL 9.0 9.7 -  Total Protein 6.5 - 8.1 g/dL 4.7(L) - -  Total Bilirubin 0.3 - 1.2 mg/dL 0.7 - -  Alkaline Phos 38 - 126 U/L 54 - -  AST 15 - 41 U/L 18 - -  ALT 0 - 44 U/L 8 - -    Renal function CrCl cannot be calculated (Unknown ideal weight.).  Hgb A1c MFr Bld (%)  Date Value  10/07/2020 5.2    LDL Cholesterol  Date Value Ref Range Status  10/07/2020 144 (H) 0 - 99 mg/dL Final    Comment:           Total Cholesterol/HDL:CHD Risk Coronary Heart Disease Risk Table                     Men   Women  1/2 Average Risk   3.4   3.3  Average Risk       5.0   4.4  2 X Average Risk   9.6   7.1  3 X Average Risk  23.4   11.0        Use the calculated Patient Ratio above and the CHD Risk Table to determine the patient's CHD Risk.        ATP III CLASSIFICATION (LDL):  <100     mg/dL   Optimal  100-129  mg/dL   Near or Above                    Optimal  130-159  mg/dL   Borderline  160-189  mg/dL   High  >190     mg/dL   Very High Performed at [facility] [address] [place], [place] [address]     Yevonne Aline. [name], MD [facility] Phone Number: [phone] 02/28/2021 5:48 PM

## 2021-03-01 ENCOUNTER — Encounter (HOSPITAL_COMMUNITY): Payer: Self-pay | Admitting: Pulmonary Disease

## 2021-03-01 DIAGNOSIS — R578 Other shock: Secondary | ICD-10-CM | POA: Diagnosis not present

## 2021-03-01 LAB — BASIC METABOLIC PANEL
Anion gap: 12 (ref 5–15)
BUN: 17 mg/dL (ref 6–20)
CO2: 27 mmol/L (ref 22–32)
Calcium: 9.2 mg/dL (ref 8.9–10.3)
Chloride: 96 mmol/L — ABNORMAL LOW (ref 98–111)
Creatinine, Ser: 4.12 mg/dL — ABNORMAL HIGH (ref 0.44–1.00)
GFR, Estimated: 12 mL/min — ABNORMAL LOW (ref >60)
Glucose, Bld: 93 mg/dL (ref 70–99)
Potassium: 3.7 mmol/L (ref 3.5–5.1)
Sodium: 135 mmol/L (ref 135–145)

## 2021-03-01 LAB — MAGNESIUM: Magnesium: 2 mg/dL (ref 1.7–2.4)

## 2021-03-01 LAB — CBC
HCT: 27.9 % — ABNORMAL LOW (ref 36.0–46.0)
Hemoglobin: 8.9 g/dL — ABNORMAL LOW (ref 12.0–15.0)
MCH: 26.9 pg (ref 26.0–34.0)
MCHC: 31.9 g/dL (ref 30.0–36.0)
MCV: 84.3 fL (ref 80.0–100.0)
Platelets: 148 10*3/uL — ABNORMAL LOW (ref 150–400)
RBC: 3.31 MIL/uL — ABNORMAL LOW (ref 3.87–5.11)
RDW: 17.1 % — ABNORMAL HIGH (ref 11.5–15.5)
WBC: 17.7 10*3/uL — ABNORMAL HIGH (ref 4.0–10.5)
nRBC: 0.1 % (ref 0.0–0.2)

## 2021-03-01 LAB — PHOSPHORUS: Phosphorus: 5.4 mg/dL — ABNORMAL HIGH (ref 2.5–4.6)

## 2021-03-01 MED ORDER — CHLORHEXIDINE GLUCONATE CLOTH 2 % EX PADS
6.0000 | MEDICATED_PAD | Freq: Every day | CUTANEOUS | Status: DC
  Administered 2021-03-02 – 2021-03-06 (×4): 6 via TOPICAL

## 2021-03-01 MED ORDER — DARBEPOETIN ALFA 100 MCG/0.5ML IJ SOSY
100.0000 ug | PREFILLED_SYRINGE | INTRAMUSCULAR | Status: AC
  Filled 2021-03-01: qty 0.5

## 2021-03-01 MED ORDER — METOPROLOL TARTRATE 25 MG PO TABS
25.0000 mg | ORAL_TABLET | Freq: Two times a day (BID) | ORAL | Status: DC
  Administered 2021-03-01 – 2021-03-05 (×8): 25 mg via ORAL
  Filled 2021-03-01 (×9): qty 1

## 2021-03-01 MED ORDER — PANTOPRAZOLE SODIUM 40 MG PO TBEC
40.0000 mg | DELAYED_RELEASE_TABLET | Freq: Every day | ORAL | Status: DC
  Administered 2021-03-01 – 2021-03-04 (×3): 40 mg via ORAL
  Filled 2021-03-01 (×3): qty 1

## 2021-03-01 MED ORDER — AMIODARONE HCL 200 MG PO TABS
200.0000 mg | ORAL_TABLET | Freq: Every day | ORAL | Status: DC
  Administered 2021-03-01 – 2021-03-11 (×9): 200 mg via ORAL
  Filled 2021-03-01 (×10): qty 1

## 2021-03-01 MED ORDER — ROSUVASTATIN CALCIUM 5 MG PO TABS
10.0000 mg | ORAL_TABLET | Freq: Every day | ORAL | Status: DC
  Administered 2021-03-01 – 2021-03-11 (×6): 10 mg via ORAL
  Filled 2021-03-01 (×9): qty 2

## 2021-03-01 MED ORDER — CALCITRIOL 0.25 MCG PO CAPS
0.5000 ug | ORAL_CAPSULE | ORAL | Status: DC
  Administered 2021-03-02 (×2): 0.5 ug via ORAL
  Filled 2021-03-01 (×4): qty 2

## 2021-03-01 MED ORDER — NEPRO/CARBSTEADY PO LIQD
237.0000 mL | Freq: Three times a day (TID) | ORAL | Status: DC
  Administered 2021-03-01 – 2021-03-06 (×5): 237 mL via ORAL

## 2021-03-01 MED ORDER — MIDODRINE HCL 5 MG PO TABS
10.0000 mg | ORAL_TABLET | Freq: Two times a day (BID) | ORAL | Status: DC
  Administered 2021-03-01 – 2021-03-04 (×5): 10 mg via ORAL
  Filled 2021-03-01 (×4): qty 2

## 2021-03-01 MED ORDER — LEVETIRACETAM 500 MG PO TABS
500.0000 mg | ORAL_TABLET | Freq: Two times a day (BID) | ORAL | Status: DC
  Administered 2021-03-01 – 2021-03-05 (×7): 500 mg via ORAL
  Filled 2021-03-01 (×10): qty 1

## 2021-03-01 MED ORDER — CHLORHEXIDINE GLUCONATE CLOTH 2 % EX PADS
6.0000 | MEDICATED_PAD | Freq: Every day | CUTANEOUS | Status: DC
  Administered 2021-03-02 – 2021-03-08 (×5): 6 via TOPICAL

## 2021-03-01 MED ORDER — RENA-VITE PO TABS
1.0000 | ORAL_TABLET | Freq: Every day | ORAL | Status: DC
  Administered 2021-03-01 – 2021-03-03 (×3): 1 via ORAL
  Filled 2021-03-01 (×6): qty 1

## 2021-03-01 MED ORDER — CALCIUM ACETATE (PHOS BINDER) 667 MG PO CAPS
667.0000 mg | ORAL_CAPSULE | Freq: Three times a day (TID) | ORAL | Status: DC
  Administered 2021-03-01 – 2021-03-09 (×11): 667 mg via ORAL
  Filled 2021-03-01 (×14): qty 1

## 2021-03-01 NOTE — Progress Notes (Signed)
[facility] Progress Note Patient Name: [name] DOB: Jun 03, 1961 MRN: [id number]   Date of Service  03/01/2021  HPI/Events of Note  Frequent PVC's - Patient is ESRD. K+ = 3.7, Mg++ = 2.0 and Creatinine = 4.12. Will not replace K+ at this time d/t ESRD.  eICU Interventions  Continue present management.     Intervention Category Major Interventions: Electrolyte abnormality - evaluation and management  Emanii Bugbee Eugene 03/01/2021, 3:30 AM

## 2021-03-01 NOTE — Consult Note (Signed)
Renal Service Consult Note [facility]  Nitasha Guia 03/01/2021 Sol Blazing, MD Requesting Physician: Dr. Silas Flood  Reason for Consult: ESRD pt w/ hypotension at HD HPI: The patient is a 60 y.o. year-old w/ hx of PAD bilat BKA, smoker, hx CVA (2015), [place] (2013), seizures, PAF, chronic memory issues, HL, HTN, ESRD on HD, CAD who presented to ED from HD yesterday after R arm AVF infiltrated and pt's BP dropped, she became unresponsive for a few minutes. They did BMV and when EMS arrived there was bleeding from HD site but a lot. There was some swelling around the AVF.  In ED pt got IVF bolus for low BP's. Hb returned at 7.4 which was down from 13 in Feb 2022.  With fluids her MS improved. She was rx'd as code sepsis d/t lactic acid > 8, given IV abx and blood cx's sent. pt was also given 1u prbc's and admitted to ICU. Asked to see for ESRD.  Seen by VVS who recommended supportive care. She did have AVF plication surgery for ulceration and aneurysm formation in Deb 2021.  Today her Hb is 8.9 after the 1u prbc overnight. Renal labs are okay, BN 17, creat 4.1, K+ 3.7. Pt is polite and quiet, very poor memory and poor historian for recent events. Pleasant.    ROS  denies CP  no joint pain   no HA  no blurry vision  no rash  no diarrhea  no nausea/ vomiting    Past Medical History  Past Medical History:  Diagnosis Date  . Anterior cerebral artery aneurysm    1996 AT [facility]  . CAD (coronary artery disease)    a. 2009 for NSTEMI with 70% dLAD, 30% mCx, 50% dCx, 95% dRCA, 40% PLA -> RCA was treated with DES.  . Diastolic heart failure    LVEF 60-65% with grade 2 diastolic dysfunction - July 2014  . Encephalopathy   . ESRD (end stage renal disease) on dialysis ([place])   . HTN (hypertension)    Resistant  . Hypercholesterolemia   . PAF (paroxysmal atrial fibrillation) ([place]) 02/03/2019  . Poor historian   . Seizures ([place])   . 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 Subarachnoid  hemorrhage ([place])    03/2012  . Tobacco abuse    Resumed smoking half a pack a day. She was a smoker in the past and states she was abl eto stop in the past using nicotine patches   Past Surgical History  Past Surgical History:  Procedure Laterality Date  . ABDOMINAL HYSTERECTOMY    . BASCILIC VEIN TRANSPOSITION Right 05/13/2013   Procedure: [place];  Surgeon: Rosetta Posner, MD;  Location: [place];  Service: Vascular;  Laterality: Right;  . BRAIN SURGERY     2   . INSERTION OF DIALYSIS CATHETER Right 10/15/2020   Procedure: INSERTION OF TUNNELED DIALYSIS CATHETER;  Surgeon: Cherre Robins, MD;  [facility];  Service: Vascular;  Laterality: Right;  . IR THORACENTESIS ASP PLEURAL SPACE W/IMG GUIDE  11/26/2020  . LOOP RECORDER IMPLANT N/A 11/14/2013   Procedure: LOOP RECORDER IMPLANT;  Surgeon: Evans Lance, MD;  Location: [facility] CATH LAB;  Service: Cardiovascular;  Laterality: N/A;  . OPEN REDUCTION INTERNAL FIXATION (ORIF) DISTAL RADIAL FRACTURE Left 11/15/2020   Procedure: OPEN REDUCTION INTERNAL FIXATION (ORIF) DISTAL RADIAL FRACTURE;  Surgeon: Verner Mould, MD;  Location: [place];  Service: Orthopedics;  Laterality: Left;  . REVISON OF ARTERIOVENOUS FISTULA Right  10/15/2020   Procedure: REVISON OF ARTERIOVENOUS FISTULA ARM;  Surgeon: Cherre Robins, MD;  Location: [facility] OR;  Service: Vascular;  Laterality: Right;  . TEE WITHOUT CARDIOVERSION N/A 11/14/2013   Procedure: TRANSESOPHAGEAL ECHOCARDIOGRAM (TEE);  Surgeon: Candee Furbish, MD;  Location: [facility] ENDOSCOPY;  Service: Cardiovascular;  Laterality: N/A;  . VENTRICULOPERITONEAL SHUNT  03/27/2012   Procedure: SHUNT INSERTION VENTRICULAR-PERITONEAL;  Surgeon: Winfield Cunas, MD;  Location: [place] NEURO ORS;  Service: Neurosurgery;  Laterality: N/A;  Ventricular-Peritoneal Shunt Insertion   Family History  Family History  Problem Relation Age of Onset  . Heart disease Mother   . Heart disease Father   . Coronary artery  disease Other        Premature in 1st degree relatives   Social History  reports that she quit smoking about 3 years ago. Her smoking use included cigarettes. She has a 7.00 pack-year smoking history. She has never used smokeless tobacco. She reports that she does not drink alcohol and does not use drugs. Allergies  Allergies  Allergen Reactions  . Amoxicillin Hives and Rash    Informed by pt's daughter [name]   . Penicillins Hives and Rash    Has patient had a PCN reaction causing immediate rash, facial/tongue/throat swelling, SOB or lightheadedness with hypotension: Yes Has patient had a PCN reaction causing severe rash involving mucus membranes or skin necrosis: No Has patient had a PCN reaction that required hospitalization: No Has patient had a PCN reaction occurring within the last 10 years: No If all of the above answers are "NO", then may proceed with Cephalosporin use. PATIENT TOLERATED CEFAZOLIN IN OR 11/15/20    Home medications Prior to Admission medications   Medication Sig Start Date End Date Taking? Authorizing Provider  amiodarone (PACERONE) 200 MG tablet Take 1 tablet (200 mg total) by mouth daily. 11/27/20   Mariel Aloe, MD  aspirin 81 MG EC tablet Take 81 mg by mouth every 8 (eight) hours as needed for pain. 12/31/17   [provider]  calcitRIOL (ROCALTROL) 0.5 MCG capsule Take 1 capsule (0.5 mcg total) by mouth Every Tuesday,Thursday,and Saturday with dialysis. 02/05/19   Hongalgi, Lenis Dickinson, MD  calcium acetate (PHOSLO) 667 MG capsule Take 667 mg by mouth 3 (three) times daily. 11/28/19   [provider]  levETIRAcetam (KEPPRA) 500 MG tablet Take 1 tablet (500 mg total) by mouth 2 (two) times daily. 11/27/20   Mariel Aloe, MD  metoprolol tartrate (LOPRESSOR) 25 MG tablet Take 1 tablet (25 mg total) by mouth 2 (two) times daily. 11/27/20   Mariel Aloe, MD  midodrine (PROAMATINE) 10 MG tablet Take 1 tablet (10 mg total) by mouth 2 (two) times  daily with a meal. 11/27/20   Mariel Aloe, MD  multivitamin (RENA-VIT) TABS tablet Take 1 tablet by mouth at bedtime. 11/27/20   Mariel Aloe, MD  nitroGLYCERIN (NITROSTAT) 0.4 MG SL tablet Place 1 tablet (0.4 mg total) under the tongue every 5 (five) minutes as needed for chest pain. 05/18/13   Janece Canterbury, MD  Nutritional Supplements (FEEDING SUPPLEMENT, NEPRO CARB STEADY,) LIQD Take 237 mLs by mouth 3 (three) times daily between meals. 11/27/20   Mariel Aloe, MD  pantoprazole (PROTONIX) 40 MG tablet Take 1 tablet (40 mg total) by mouth daily. 11/27/20   Mariel Aloe, MD  polyethylene glycol (MIRALAX / GLYCOLAX) 17 g packet Take 17 g by mouth 2 (two) times daily. 11/27/20   Mariel Aloe,  MD  rosuvastatin (CRESTOR) 10 MG tablet Take 10 mg by mouth daily. 10/16/20   [provider]  VELPHORO 500 MG chewable tablet Chew by mouth. 12/20/20   [provider]     Vitals:   03/01/21 1400 03/01/21 1531 03/01/21 1600 03/01/21 1605  BP: (!) 94/46 (!) 114/44 110/76   Pulse:      Resp: '19 15 18   '$ Temp:      TempSrc:      SpO2:      Weight:      Height:    '5\' 1"'$  (1.549 m)   Exam Gen alert, no distress No rash, cyanosis or gangrene Sclera anicteric, throat clear  No jvd or bruits Chest clear bilat to bases, no rales/ wheezing RRR no MRG Abd soft ntnd no mass or ascites +bs GU normal MS no joint effusions or deformity Ext bilat BKA, no edema Neuro is alert, Ox 2, nonfocal RUA AVF +bruit, some swelling medial upper aspect       Home meds:  - amiodraone 200 qd / midodrine 10 bid/ metoprolol 25 bid  - crestor 10/ protonix 40 qd/ phoslo 1 ac tid/ rocaltrol 0.5 tts w/ hd  - keppra 500 bid  - prn's/ vitamins/ supplements   Na 135  K 3.7  BN 17  Cr 4.12  WBC 17K Hb 8.9  CXR 5/12 - IMPRESSION: 1. No acute abnormality. 2. Stable moderate-sized right pleural effusion. 3. Stable cardiomegaly and decreased right basilar atelectasis.    OP HD: TTS [facility]  4h 400/500   37.5kg  2/2 bath  RUA AVF/ RIJ TDC  - sensipar 90 po tiw, on hold from 4/14  - mircera 150 q2 last 4.28  - calcitriol 1.25 ug tiw   Assessment/ Plan: 1.  Shock - acute, hypovolemic w/ acute blood loss after AVF infiltrated at OP HD. VVS consulted, doubts AVF bleed would account for such large drop in Hb given her exam. Hb stable and hypotension resolved.  2. ESRD - on HD TTS. HD tomorrow, plan HD 1st shift in am using the R IJ TDC.  3. Seizures- takes keppra 4. Atrial fib - on amio, holding BP w/ low BP's. Not on AC due to prior ICH, hx of anemia 5. Anemia ckd - due for esa, will give w/ HD tomorrow 6. MBD ckd - cont meds     Kelly Splinter  MD 03/01/2021, 4:17 PM  Recent Labs  Lab 02/28/21 1406 03/01/21 0046  WBC 4.9 17.7*  HGB 7.4* 8.9*   Recent Labs  Lab 02/28/21 1406 03/01/21 0046  K 3.4* 3.7  BUN 15 17  CREATININE 4.37* 4.12*  CALCIUM 9.0 9.2  PHOS  --  5.4*

## 2021-03-01 NOTE — Progress Notes (Signed)
Initial Nutrition Assessment  DOCUMENTATION CODES:   Underweight  INTERVENTION:    Nepro Shake po TID, each supplement provides 425 kcal and 19 grams protein.  Renal MVI daily.  NUTRITION DIAGNOSIS:   Increased nutrient needs related to wound healing,chronic illness (ESRD, underweight status) as evidenced by estimated needs.  GOAL:   Patient will meet greater than or equal to 90% of their needs  MONITOR:   PO intake,Supplement acceptance,Labs,Weight trends,Skin  REASON FOR ASSESSMENT:   Rounds    ASSESSMENT:   60 yo female admitted with hypovolemic shock, acute blood loss from AV fistula, AMS, questionable seizure activity at HD center. PMH includes ESRD on HD, HTN, diastolic HF, CAD, PAF, CVA/SAH s/p VP shunt.   Discussed patient in ICU rounds and with RN today. Patient has Nepro shakes listed on her home medication list.   Patient has infiltration hematoma of R arm. Plans to rest fistula for 2 weeks and use R IJ TDC for dialysis.   Patient was confused during RD visit this morning. She was unable to provide any reliable nutrition history.  She has moderate to severe muscle depletion on physical exam.   Labs reviewed. Phos 5.4 CBG: 77-97  Medications reviewed and include calcitriol, phoslo, keppra, rena-vit.  Weight history reviewed. Weight has fluctuated between 40.4 kg and 47.2 kg since December 2021. Suspect weight fluctuations are related to fluid shifts with hx of ESRD on HD and CHF. R arm is swollen. Suspect actual weight is less than recorded weight.  Unsure of EDW at this time.   NUTRITION - FOCUSED PHYSICAL EXAM:  Flowsheet Row Most Recent Value  Orbital Region No depletion  Upper Arm Region Unable to assess  Thoracic and Lumbar Region Unable to assess  Buccal Region No depletion  Temple Region Mild depletion  Clavicle Bone Region Moderate depletion  Clavicle and Acromion Bone Region Moderate depletion  Scapular Bone Region Moderate depletion   Dorsal Hand Mild depletion  Patellar Region Severe depletion  Anterior Thigh Region Severe depletion  Posterior Calf Region Severe depletion  Edema (RD Assessment) None  Hair Reviewed  Eyes Reviewed  Mouth Reviewed  Skin Reviewed  Nails Reviewed       Diet Order:   Diet Order            Diet renal with fluid restriction Fluid restriction: 2000 mL Fluid; Room service appropriate? Yes; Fluid consistency: Thin  Diet effective now                 EDUCATION NEEDS:   Not appropriate for education at this time  Skin:  Skin Assessment: Skin Integrity Issues: Skin Integrity Issues:: Stage II Stage II: sacrum  Last BM:  no BM documented  Height:   Ht Readings from Last 1 Encounters:  03/01/21 '5\' 1"'$  (1.549 m)    Weight:   Wt Readings from Last 1 Encounters:  03/01/21 42.1 kg    Ideal Body Weight:  47.7 kg  BMI:  Body mass index is 17.54 kg/m.  Estimated Nutritional Needs:   Kcal:  [id number]  Protein:  65-75 gm  Fluid:  1 L + UOP    Lucas Mallow, RD, LDN, CNSC Please refer to Amion for contact information.

## 2021-03-01 NOTE — Progress Notes (Signed)
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   Patient is encephalopathic, therefore history has been obtained from chart review. Per EMS, patient began dialysis and 30 seconds after cannulation, site was noted to be infiltrating into the R chest and R arm. Patient had questionable seizure activity (per HD center staff) and became unresponsive. EMS was called and began to ventilate patient with BVM. Tourniquet clamp was applied by EMS.  On arrival to ED, patient was hypotensive. Received 549m bolus via Permcath (accessed emergently). Labs were notable for Hgb 7.4 (previously ~13 three months ago), Plt 155, INR 1.3. Na 134, K 3.4, CO2 26, Cr 4.37 (on HD). Trop 42, LA 8.1. 1U PRBCs was ordered. CT Head negative for acute processes. PCCM consulted for hypotension/ICU admission. Advised urgent Vascular consult to assess AVF.  Of note, underwent revision of RUE AVF 12/27 with VVS (Dr. HStanford [name] during which two areas of aneurysmal degradation were excised with subsequent end-to-end reconstruction. A Permcath was placed at that time to allow for HD access during healing.  Pertinent Medical History:   Past Medical History:  Diagnosis Date  . [facility] artery [facility] . CAD (coronary artery disease)    a. 2009 for NSTEMI with 70% dLAD, 30% mCx, 50% dCx, 95% dRCA, 40% PLA -> RCA was treated with DES.  . Diastolic heart failure    LVEF 60-65% with grade 2 diastolic dysfunction - July 2014  . Encephalopathy   . ESRD (end stage renal disease) on dialysis ([place]   . HTN (hypertension)     Resistant  . Hypercholesterolemia   . PAF (paroxysmal atrial fibrillation) ([place] 02/03/2019  . Poor historian   . Seizures ([place]   . Stroke (cerebrum) ([place] 2015  . Sub[place]achnoid hemorrhage ([place]    03/2012  . Tobacco abuse    Resumed smoking half a pack a day. She was a smoker in the past and states she was abl eto stop in the past using nicotine patches   Significant Hospital Events: Including procedures, antibiotic start and stop dates in addition to other pertinent events   . [facility] cannulation site infiltrated (R AVF into R chest). Unresponsive x 5 minutes in HD, required BVM ventilation. Hgb 7.4 in ED, 1U PRBCs ordered. PCCM consult for admission. Vascular consult. . 5/13 Awake and off pressors with stable Hgb, stable for txfr to [facility]  CXR without acute cardiopulmonary process, stable moderate R pleural effusion, stable cardiomegaly and R basilar atelectasis CT Head stable position of VP shunt with stable ventricular enlargement, multiple prior infarcts (largest L parietal lobe), no acute infarct evident; no mass/hemorrhage or appreciable fluid collection  Interim History / Subjective:   Off pressors overnight Awake and Hgb stable, wants to eat Vascular following  Objective:  Blood pressure 137/61, pulse 76, temperature 97.8 F (36.6 C), temperature source Oral, resp. rate 16, weight 42.1 kg, SpO2 100 %.        Intake/Output Summary (Last 24 hours) at 03/01/2021 [id number] data  filed at 03/01/2021 0500 Gross per 24 hour  Intake 2370.72 ml  Output --  Net 2370.72 ml   Filed Weights   03/01/21 0340  Weight: 42.1 kg     General:  Thin, chronically ill-appearing F, awake and in no distress HEENT: MM pink/moist, sclera anicteric Neuro: awake, fatigued but conversational and oriented and following commands CV: s1s2 irregularly irregular, no m/r/g PULM:  On 2L  without tachypnea or accessory muscle use, course in  bilateral bases, but no rhonchi or wheezing GI: soft, bsx4 active  Extremities: warm/dry, no edema, RUE AV fistula with palpable thrill, mild edema of the R chest wall and R arm, good pulses and soft compartments Skin: no rashes or lesions   Labs/imaging that I have personally reviewed: (right click and "Reselect all SmartList Selections" daily)   CBC-WBC 17k, Hgb 8.9 post one unit [facility]    Resolved Hospital Problem List:     Assessment & Plan:     Hypovolemic shock likely hemorrhagic secondary to ABLA from fistula infiltrate -Patient suffered right fistula infiltration at outpatient HD that resulted in significant arm swelling that tracked up to chest concerning for severe bleeding.  -Shock resolved with one unit PRBC's -Pt had RUE AV fistula plication for aneurysmal and ulcerated fistula 10/15/20, seen for follow up 3/18 and had well-functioning fistula at that time P: -shock resolved after IVF and 1 unit PRBC's, now off pressors. Pt denies melena or hematochezia and no hx of PUD -continue to monitor BP and trend Hgb, transfuse for Hgb <7 -Vascular following recommend dialyzing via R IJ TDC and resting AV fistula for about two weeks, no further work-up currently indicated -stop IVF, resume diet -hold anti-coagulation, SCD's -though has leukocytosis pt is afebrile without a clear source of infection and has improved with volume, therefore doubt septic source and will d/c abx, follow blood cultures to completion -stable for transfer out if ICU     ESRD on iHD  P: -consult nephrology, is on TTS iHD schedule at baseline -follow electrolytes and volume status  -Trend Bmet, Ensure adequate renal perfusion   Prior stroke Hx of seizures  -On Keppra, reported unresponsiveness at HD at the time of infilatrate P: -awake and back to baseline, continue home Keppra and seizure precautions -CTH negative for acute findings  Hx of paroxysmal A-fib, HFpEF Hx of CAD with prior  NSTEMI and s/p DES Hx of HTN/HLD - Home medications include Amiodarone and lopressor  - ECHO with EF 60-65% and grade II diastolic dysfunction  P: -continue amiodarone, BP improved but still borderline so hold bb -monitor on telemetry -continue statin -hold Asa in the setting of ABLA -not a candidate for anti-coagulation 2/2 prior ICH/SAH/Anemia per prior notes    Best Practice: (right click and "Reselect all SmartList Selections" daily)  Diet:  renal Pain/Anxiety/Delirium protocol (if indicated): No VAP protocol (if indicated): Not indicated DVT prophylaxis: SCD's GI prophylaxis: N/A Glucose control:  SSI No Central venous access:  Yes, and it is still needed - Permcath Arterial line:  N/A Foley:  N/A Mobility:  OOB with assistance PT consulted: N/A Last date of multidisciplinary goals of care discussion [Pending] Code Status:  full code Disposition: txfr to the floor  Labs:   CBC: Recent Labs  Lab 02/28/21 1406 03/01/21 0046  WBC 4.9 17.7*  NEUTROABS 3.3  --   HGB 7.4* 8.9*  HCT 25.5* 27.9*  MCV 87.3 84.3  PLT 155 148*    Basic Metabolic Panel: Recent Labs  Lab  02/28/21 1406 03/01/21 0046  NA 134* 135  K 3.4* 3.7  CL 94* 96*  CO2 26 27  GLUCOSE 121* 93  BUN 15 17  CREATININE 4.37* 4.12*  CALCIUM 9.0 9.2  MG  --  2.0  PHOS  --  5.4*   GFR: Estimated Creatinine Clearance: 9.9 mL/min (A) (by C-G formula based on SCr of 4.12 mg/dL (H)). Recent Labs  Lab 02/28/21 1406 02/28/21 1606 03/01/21 0046  WBC 4.9  --  17.7*  LATICACIDVEN 8.1* 8.7*  --     Liver Function Tests: Recent Labs  Lab 02/28/21 1406  AST 18  ALT 8  ALKPHOS 54  BILITOT 0.7  PROT 4.7*  ALBUMIN 1.5*   No results for input(s): LIPASE, AMYLASE in the last 168 hours. No results for input(s): AMMONIA in the last 168 hours.  ABG:    Component Value Date/Time   PHART 7.429 11/09/2013 0335   PCO2ART 38.1 11/09/2013 0335   PO2ART 80.1 11/09/2013 0335   HCO3 15.8 (L)  11/15/2020 1413   TCO2 31 11/28/2020 2027   ACIDBASEDEF 9.0 (H) 11/15/2020 1413   O2SAT 84.0 11/15/2020 1413     Coagulation Profile: Recent Labs  Lab 02/28/21 1541  INR 1.3*    Cardiac Enzymes: No results for input(s): CKTOTAL, CKMB, CKMBINDEX, TROPONINI in the last 168 hours.  HbA1C: Hgb A1c MFr Bld  Date/Time Value Ref Range Status  10/07/2020 02:05 AM 5.2 4.8 - 5.6 % Final    Comment:    (NOTE) Pre diabetes:          5.7%-6.4%  Diabetes:              >6.4%  Glycemic control for   <7.0% adults with diabetes   11/09/2013 04:33 AM 5.9 (H) <5.7 % Final    Comment:    (NOTE)                                                                       According to the ADA Clinical Practice Recommendations for 2011, when HbA1c is used as a screening test:  >=6.5%   Diagnostic of Diabetes Mellitus           (if abnormal result is confirmed) 5.7-6.4%   Increased risk of developing Diabetes Mellitus References:Diagnosis and Classification of Diabetes Mellitus,Diabetes [id number] 1):S62-S69 and Standards of Medical Care in         Diabetes - 2011,Diabetes [id number] (Suppl 1):S11-S61.    CBG: Recent Labs  Lab 02/28/21 2020 02/28/21 2329  GLUCAP 77 97    Review of Systems:   Patient is encephalopathic and/or intubated. Therefore history has been obtained from chart review.   Past Medical History:  She,  has a past medical history of Anterior cerebral artery aneurysm, CAD (coronary artery disease), Diastolic heart failure, Encephalopathy, ESRD (end stage renal disease) on dialysis ([place]), HTN (hypertension), Hypercholesterolemia, PAF (paroxysmal atrial fibrillation) ([place]) (02/03/2019), Poor historian, Seizures ([place]), Stroke (cerebrum) ([place]) (2015), Subarachnoid hemorrhage ([place]), and Tobacco abuse.   Surgical History:   Past Surgical History:  Procedure Laterality Date  . ABDOMINAL HYSTERECTOMY    . BASCILIC VEIN TRANSPOSITION Right 05/13/2013   Procedure:  [place];  Surgeon: Rosetta Posner, MD;  Location: [place];  Service: Vascular;  Laterality: Right;  . BRAIN SURGERY     2   . INSERTION OF DIALYSIS CATHETER Right 10/15/2020   Procedure: INSERTION OF TUNNELED DIALYSIS CATHETER;  Surgeon: Cherre Robins, MD;  Location: [facility] OR;  Service: Vascular;  Laterality: Right;  . IR THORACENTESIS ASP PLEURAL SPACE W/IMG GUIDE  11/26/2020  . LOOP RECORDER IMPLANT N/A 11/14/2013   Procedure: LOOP RECORDER IMPLANT;  Surgeon: Evans Lance, MD;  [facility];  Service: Cardiovascular;  Laterality: N/A;  . OPEN REDUCTION INTERNAL FIXATION (ORIF) DISTAL RADIAL FRACTURE Left 11/15/2020   Procedure: OPEN REDUCTION INTERNAL FIXATION (ORIF) DISTAL RADIAL FRACTURE;  Surgeon: Verner Mould, MD;  Location: [place];  Service: Orthopedics;  Laterality: Left;  . REVISON OF ARTERIOVENOUS FISTULA Right 10/15/2020   Procedure: REVISON OF ARTERIOVENOUS FISTULA ARM;  Surgeon: Cherre Robins, MD;  Location: [place];  Service: Vascular;  Laterality: Right;  . TEE WITHOUT CARDIOVERSION N/A 11/14/2013   Procedure: TRANSESOPHAGEAL ECHOCARDIOGRAM (TEE);  Surgeon: Candee Furbish, MD;  Location: [facility] ENDOSCOPY;  Service: Cardiovascular;  Laterality: N/A;  . VENTRICULOPERITONEAL SHUNT  03/27/2012   Procedure: SHUNT INSERTION VENTRICULAR-PERITONEAL;  Surgeon: Winfield Cunas, MD;  Location: [place] NEURO ORS;  Service: Neurosurgery;  Laterality: N/A;  Ventricular-Peritoneal Shunt Insertion     Social History:   reports that she quit smoking about 3 years ago. Her smoking use included cigarettes. She has a 7.00 pack-year smoking history. She has never used smokeless tobacco. She reports that she does not drink alcohol and does not use drugs.   Family History:  Her family history includes Coronary artery disease in an other family member; Heart disease in her father and mother.   Allergies Allergies  Allergen Reactions  . Amoxicillin Hives and Rash    Informed by  pt's daughter [name]   . Penicillins Hives and Rash    Has patient had a PCN reaction causing immediate rash, facial/tongue/throat swelling, SOB or lightheadedness with hypotension: Yes Has patient had a PCN reaction causing severe rash involving mucus membranes or skin necrosis: No Has patient had a PCN reaction that required hospitalization: No Has patient had a PCN reaction occurring within the last 10 years: No If all of the above answers are "NO", then may proceed with Cephalosporin use. PATIENT TOLERATED CEFAZOLIN IN OR 11/15/20      Home Medications  Prior to Admission medications   Medication Sig Start Date End Date Taking? Authorizing Provider  amiodarone (PACERONE) 200 MG tablet Take 1 tablet (200 mg total) by mouth daily. 11/27/20   Mariel Aloe, MD  aspirin 81 MG EC tablet Take 81 mg by mouth every 8 (eight) hours as needed for pain. 12/31/17   [provider]  calcitRIOL (ROCALTROL) 0.5 MCG capsule Take 1 capsule (0.5 mcg total) by mouth Every Tuesday,Thursday,and Saturday with dialysis. 02/05/19   Hongalgi, Lenis Dickinson, MD  calcium acetate (PHOSLO) 667 MG capsule Take 667 mg by mouth 3 (three) times daily. 11/28/19   [provider]  levETIRAcetam (KEPPRA) 500 MG tablet Take 1 tablet (500 mg total) by mouth 2 (two) times daily. 11/27/20   Mariel Aloe, MD  metoprolol tartrate (LOPRESSOR) 25 MG tablet Take 1 tablet (25 mg total) by mouth 2 (two) times daily. 11/27/20   Mariel Aloe, MD  midodrine (PROAMATINE) 10 MG tablet Take 1 tablet (10 mg total) by mouth 2 (two) times daily with a meal. 11/27/20   Mariel Aloe, MD  multivitamin (RENA-VIT) TABS tablet Take 1 tablet by mouth at bedtime. 11/27/20   Mariel Aloe, MD  nitroGLYCERIN (NITROSTAT) 0.4 MG SL tablet Place 1 tablet (0.4 mg total) under the tongue every 5 (five) minutes as needed for chest pain. 05/18/13   Janece Canterbury, MD  Nutritional Supplements (FEEDING SUPPLEMENT, NEPRO CARB STEADY,) LIQD Take  237 mLs by mouth 3 (three) times daily between meals. 11/27/20   Mariel Aloe, MD  pantoprazole (PROTONIX) 40 MG tablet Take 1 tablet (40 mg total) by mouth daily. 11/27/20   Mariel Aloe, MD  polyethylene glycol (MIRALAX / GLYCOLAX) 17 g packet Take 17 g by mouth 2 (two) times daily. 11/27/20   Mariel Aloe, MD  rosuvastatin (CRESTOR) 10 MG tablet Take 10 mg by mouth daily. 10/16/20   [provider]    Critical care time: 35 minutes    CRITICAL CARE Performed by: Otilio Carpen Eriona Kinchen   Total critical care time: 35 minutes  Critical care time was exclusive of separately billable procedures and treating other patients.  Critical care was necessary to treat or prevent imminent or life-threatening deterioration.  Critical care was time spent personally by me on the following activities: development of treatment plan with patient and/or surrogate as well as nursing, discussions with consultants, evaluation of patient's response to treatment, examination of patient, obtaining history from patient or surrogate, ordering and performing treatments and interventions, ordering and review of laboratory studies, ordering and review of radiographic studies, pulse oximetry and re-evaluation of patient's condition.   Otilio Carpen Rithvik Orcutt, PA-C [facility] See Amion for pager If no response to pager , please call [phone] until 7pm After 7:00 pm call Elink  [phone]?[place]

## 2021-03-01 NOTE — Progress Notes (Addendum)
  Progress Note    03/01/2021 7:43 AM  Subjective:  R arm soreness   Vitals:   03/01/21 0515 03/01/21 0530  BP: (!) 142/72 137/61  Pulse:    Resp: 17 16  Temp:    SpO2:     Physical Exam: Lungs:  Non labored on O2 by [place] Extremities:  R hand warm and well perfused; palpable thrill R arm AVF; small infiltration hematoma; skin viable Neurologic: A&O  CBC    Component Value Date/Time   WBC 17.7 (H) 03/01/2021 0046   RBC 3.31 (L) 03/01/2021 0046   HGB 8.9 (L) 03/01/2021 0046   HCT 27.9 (L) 03/01/2021 0046   PLT 148 (L) 03/01/2021 0046   MCV 84.3 03/01/2021 0046   MCH 26.9 03/01/2021 0046   MCHC 31.9 03/01/2021 0046   RDW 17.1 (H) 03/01/2021 0046   LYMPHSABS 1.3 02/28/2021 1406   MONOABS 0.2 02/28/2021 1406   EOSABS 0.0 02/28/2021 1406   BASOSABS 0.0 02/28/2021 1406    BMET    Component Value Date/Time   NA 135 03/01/2021 0046   K 3.7 03/01/2021 0046   CL 96 (L) 03/01/2021 0046   CO2 27 03/01/2021 0046   GLUCOSE 93 03/01/2021 0046   BUN 17 03/01/2021 0046   CREATININE 4.12 (H) 03/01/2021 0046   CALCIUM 9.2 03/01/2021 0046   GFRNONAA 12 (L) 03/01/2021 0046   GFRAA 5 (L) 02/03/2019 0652    INR    Component Value Date/Time   INR 1.3 (H) 02/28/2021 1541     Intake/Output Summary (Last 24 hours) at 03/01/2021 0743 Last data filed at 03/01/2021 0500 Gross per 24 hour  Intake 2370.72 ml  Output --  Net 2370.72 ml     Assessment/Plan:  60 y.o. female with hypotension and anemia on admission  R arm AV fistula patent with palpable thrill Infiltration hematoma of R arm is not impressive; this would not explain profound anemia Plan is to rest fistula for about 2 weeks and dialyze via R IJ [facility] Encouraged patient to elevate R arm and exercise hand Nothing further to add from vascular standpoint; call if questions   Dagoberto Ligas, PA-C [facility] [phone] 03/01/2021 7:43 AM  VASCULAR STAFF ADDENDUM: I have independently  interviewed and examined the patient. I agree with the above.  No change in exam over past 12 hours. 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 Please call for questions.  Yevonne Aline. [name], MD [facility] Phone Number: [phone] 03/01/2021 7:55 AM

## 2021-03-02 ENCOUNTER — Inpatient Hospital Stay (HOSPITAL_COMMUNITY): Payer: 59

## 2021-03-02 DIAGNOSIS — R571 Hypovolemic shock: Secondary | ICD-10-CM | POA: Diagnosis not present

## 2021-03-02 LAB — CBC
HCT: 20.9 % — ABNORMAL LOW (ref 36.0–46.0)
HCT: 38.5 % (ref 36.0–46.0)
HCT: 45.7 % (ref 36.0–46.0)
Hemoglobin: 6.8 g/dL — CL (ref 12.0–15.0)
Hemoglobin: 13.5 g/dL (ref 12.0–15.0)
Hemoglobin: 15.6 g/dL — ABNORMAL HIGH (ref 12.0–15.0)
MCH: 27 pg (ref 26.0–34.0)
MCH: 28.1 pg (ref 26.0–34.0)
MCH: 27.8 pg (ref 26.0–34.0)
MCHC: 32.5 g/dL (ref 30.0–36.0)
MCHC: 35.1 g/dL (ref 30.0–36.0)
MCHC: 34.1 g/dL (ref 30.0–36.0)
MCV: 82.9 fL (ref 80.0–100.0)
MCV: 80.2 fL (ref 80.0–100.0)
MCV: 81.5 fL (ref 80.0–100.0)
Platelets: 153 10*3/uL (ref 150–400)
Platelets: 105 10*3/uL — ABNORMAL LOW (ref 150–400)
Platelets: 100 10*3/uL — ABNORMAL LOW (ref 150–400)
RBC: 2.52 MIL/uL — ABNORMAL LOW (ref 3.87–5.11)
RBC: 4.8 MIL/uL (ref 3.87–5.11)
RBC: 5.61 MIL/uL — ABNORMAL HIGH (ref 3.87–5.11)
RDW: 17.5 % — ABNORMAL HIGH (ref 11.5–15.5)
RDW: 16.4 % — ABNORMAL HIGH (ref 11.5–15.5)
RDW: 17.3 % — ABNORMAL HIGH (ref 11.5–15.5)
WBC: 7.1 10*3/uL (ref 4.0–10.5)
WBC: 13.6 10*3/uL — ABNORMAL HIGH (ref 4.0–10.5)
WBC: 6.8 10*3/uL (ref 4.0–10.5)
nRBC: 0 % (ref 0.0–0.2)
nRBC: 0.2 % (ref 0.0–0.2)
nRBC: 0 % (ref 0.0–0.2)

## 2021-03-02 LAB — RENAL FUNCTION PANEL
Albumin: 1.3 g/dL — ABNORMAL LOW (ref 3.5–5.0)
Anion gap: 9 (ref 5–15)
BUN: 22 mg/dL — ABNORMAL HIGH (ref 6–20)
CO2: 29 mmol/L (ref 22–32)
Calcium: 9.2 mg/dL (ref 8.9–10.3)
Chloride: 96 mmol/L — ABNORMAL LOW (ref 98–111)
Creatinine, Ser: 4.79 mg/dL — ABNORMAL HIGH (ref 0.44–1.00)
GFR, Estimated: 10 mL/min — ABNORMAL LOW (ref >60)
Glucose, Bld: 87 mg/dL (ref 70–99)
Phosphorus: 5.3 mg/dL — ABNORMAL HIGH (ref 2.5–4.6)
Potassium: 3.6 mmol/L (ref 3.5–5.1)
Sodium: 134 mmol/L — ABNORMAL LOW (ref 135–145)

## 2021-03-02 LAB — DIC (DISSEMINATED INTRAVASCULAR COAGULATION)PANEL
D-Dimer, Quant: 14.76 ug/mL-FEU — ABNORMAL HIGH (ref 0.00–0.50)
Fibrinogen: 437 mg/dL (ref 210–475)
INR: 1.1 (ref 0.8–1.2)
Platelets: 152 10*3/uL (ref 150–400)
Prothrombin Time: 14.2 seconds (ref 11.4–15.2)
Smear Review: NONE SEEN
aPTT: 36 seconds (ref 24–36)

## 2021-03-02 LAB — PREPARE RBC (CROSSMATCH)

## 2021-03-02 LAB — TSH: TSH: 2.244 u[IU]/mL (ref 0.350–4.500)

## 2021-03-02 LAB — T4, FREE: Free T4: 1.38 ng/dL — ABNORMAL HIGH (ref 0.61–1.12)

## 2021-03-02 LAB — LACTATE DEHYDROGENASE: LDH: 266 U/L — ABNORMAL HIGH (ref 98–192)

## 2021-03-02 MED ORDER — MIDODRINE HCL 5 MG PO TABS
ORAL_TABLET | ORAL | Status: AC
  Administered 2021-03-02: 10 mg via ORAL
  Filled 2021-03-02: qty 2

## 2021-03-02 MED ORDER — CALCITRIOL 0.5 MCG PO CAPS
ORAL_CAPSULE | ORAL | Status: AC
  Filled 2021-03-02: qty 1

## 2021-03-02 MED ORDER — HEPARIN SODIUM (PORCINE) 1000 UNIT/ML IJ SOLN: 1000.0000 [IU] | INTRAMUSCULAR | Status: DC | PRN

## 2021-03-02 MED ORDER — DARBEPOETIN ALFA 100 MCG/0.5ML IJ SOSY
PREFILLED_SYRINGE | INTRAMUSCULAR | Status: AC
  Administered 2021-03-02: 100 ug via INTRAVENOUS
  Filled 2021-03-02: qty 0.5

## 2021-03-02 MED ORDER — SODIUM CHLORIDE 0.9% IV SOLUTION: Freq: Once | INTRAVENOUS | Status: DC

## 2021-03-02 MED ORDER — HEPARIN SODIUM (PORCINE) 1000 UNIT/ML IJ SOLN
INTRAMUSCULAR | Status: AC
  Administered 2021-03-02: 3200 [IU] via INTRAVENOUS
  Filled 2021-03-02: qty 4

## 2021-03-02 NOTE — Progress Notes (Signed)
PT Cancellation Note  Patient Details Name: [name] MRN: [id number] DOB: 02-28-1961   Cancelled Treatment:    Reason Eval/Treat Not Completed: Patient at procedure or test/unavailable. Patient at HD this morning. Will re-attempt this pm as appropriate.     [name] 03/02/2021, 11:31 AM

## 2021-03-02 NOTE — Progress Notes (Signed)
Dr. Candiss Norse ordered new labs to be drawn.  Spoke with Dialysis RN, Linna Hoff and requested he pull those labs.  He said he would

## 2021-03-02 NOTE — Progress Notes (Addendum)
Spoke with Dialysis Team and notified them of critical hgb 6.9 lab value.  Dr. Candiss Norse has ordered 2 units of prbc to be administered during dialysis.  Nurse Linna Hoff will administer the blood.

## 2021-03-02 NOTE — Progress Notes (Addendum)
[facility] Progress Note   Subjective:     [name] was seen and examined during her scheduled HD treatment today. Noted current Hgb of 6.8 and 2 units PRBCs were ordered to be given during HD. Patient with UFG 5-5.5L as tolerated. Currently, patient with no complaints at this time. Denies SOB, CP, ABD pain, and N/V/D.   Objective Vitals:   03/02/21 0917 03/02/21 0931 03/02/21 0948 03/02/21 0950  BP: (!) 88/50 137/65 108/64   Pulse: 80 77 77 81  Resp: '12 19 14 '$ (!) 2  Temp: 97.7 F (36.5 C) 98.4 F (36.9 C) 98.3 F (36.8 C) 98.3 F (36.8 C)  TempSrc: Oral Axillary Axillary Axillary  SpO2:      Weight:      Height:       Physical Exam General: Frail appearing; no acute respiratory distress Heart: Normal S1 and S2; No murmurs, gallops, or friction rub Lungs: Respirations unlabored; Lungs clear anteriorly; No wheezing, rales, or rhonchi Abdomen: Small, soft, non-tender active bowel sounds Extremities: No edema bilateral lower extremities Dialysis Access: R AVF (+) Bruit/Thrill- mild swelling noted. R IJ TDC in use.  Filed Weights   03/01/21 0340 03/02/21 0501 03/02/21 0825  Weight: 42.1 kg 41.2 kg 44.5 kg    Intake/Output Summary (Last 24 hours) at 03/02/2021 1004 Last data filed at 03/02/2021 0505 Gross per 24 hour  Intake 200 ml  Output 250 ml  Net -50 ml    Additional Objective Labs: Basic Metabolic Panel: Recent Labs  Lab 02/28/21 1406 03/01/21 0046 03/02/21 0833  NA 134* 135 134*  K 3.4* 3.7 3.6  CL 94* 96* 96*  CO2 '26 27 29  '$ GLUCOSE 121* 93 87  BUN 15 17 22*  CREATININE 4.37* 4.12* 4.79*  CALCIUM 9.0 9.2 9.2  PHOS  --  5.4* 5.3*   Liver Function Tests: Recent Labs  Lab 02/28/21 1406 03/02/21 0833  AST 18  --   ALT 8  --   ALKPHOS 54  --   BILITOT 0.7  --   PROT 4.7*  --   ALBUMIN 1.5* 1.3*   No results for input(s): LIPASE, AMYLASE in the last 168 hours. CBC: Recent Labs  Lab 02/28/21 1406 03/01/21 0046 03/02/21 0725   WBC 4.9 17.7* 7.1  NEUTROABS 3.3  --   --   HGB 7.4* 8.9* 6.8*  HCT 25.5* 27.9* 20.9*  MCV 87.3 84.3 82.9  PLT 155 148* 153   Blood Culture    Component Value Date/Time   SDES BLOOD LEFT HAND 02/02/2019 1347   SPECREQUEST  02/02/2019 1347    BOTTLES DRAWN AEROBIC AND ANAEROBIC Blood Culture results may not be optimal due to an inadequate volume of blood received in culture bottles   CULT  02/02/2019 1347    NO GROWTH 5 DAYS Performed at [facility], [place] [address] [place], [place] [address]    REPTSTATUS 02/07/2019 FINAL 02/02/2019 1347   CBG: Recent Labs  Lab 02/28/21 2020 02/28/21 2329  GLUCAP 77 97   Iron Studies: No results for input(s): IRON, TIBC, TRANSFERRIN, FERRITIN in the last 72 hours. Lab Results  Component Value Date   INR 1.3 (H) 02/28/2021   INR 1.2 11/28/2020   INR 1.3 (H) 11/09/2020   Studies/Results: DG Chest 1 View  Result Date: 02/28/2021 CLINICAL DATA:  The patient became unresponsive during dialysis today. EXAM: CHEST  1 VIEW COMPARISON:  11/28/2020 FINDINGS: Stable right jugular double-lumen catheter, ventriculoperitoneal shunt tubing and enlarged  cardiac silhouette. The aorta remains tortuous and partially calcified. No significant change in a moderate-sized right pleural effusion. Decreased associated right basilar atelectasis. Clear left lung. Diffuse osteopenia. IMPRESSION: 1. No acute abnormality. 2. Stable moderate-sized right pleural effusion. 3. Stable cardiomegaly and decreased right basilar atelectasis. Electronically Signed   By: Claudie Revering M.D.   On: 02/28/2021 14:43   CT Head Wo Contrast  Result Date: 02/28/2021 CLINICAL DATA:  Altered mental status EXAM: CT HEAD WITHOUT CONTRAST TECHNIQUE: Contiguous axial images were obtained from the base of the skull through the vertex without intravenous contrast. COMPARISON:  November 28, 2020, November 19, 2020 common November 17, 2020 FINDINGS: Brain: There is a ventriculostomy catheter  inserted from the right frontal region with the tip in the left mid brain slightly posterior to the third ventricle, unchanged. There remains moderate generalized ventricular prominence. There is a degree of underlying sulcal atrophy, stable. There is no intracranial mass or hemorrhage. Previous small extra-axial fluid collections are no longer evident. No subdural or epidural fluid collections are appreciable currently. There is no midline shift. There is evidence of encephalomalacia in the left parietal lobe consistent with prior infarct, stable. There is small vessel disease throughout the centra semiovale bilaterally, stable. There are posterior cerebellar infarcts bilaterally, stable. There is evidence of a prior infarct involving the anterior limbs of the left external and extreme capsules with infarct in the anterior left claustrum. No acute appearing infarct is evident. Vascular: Apparent apparent coil at basilar tip level is stable. There is a clip in the right middle cerebral artery distribution. There is calcification in each carotid siphon region. There is also calcification in each distal vertebral artery. No hyperdense vessel evident. Skull: Evidence of previous craniotomy on the right in the mid to posterior frontal region. No new bone lesions. Sinuses/Orbits: Paranasal sinuses clear. Orbits appear symmetric bilaterally. Other: Mastoid air cells clear. IMPRESSION: Stable postoperative changes. Stable position of ventriculoperitoneal shunt with tip posterior to the third ventricle in the left midbrain region. Stable ventricular enlargement. Multiple prior infarcts, largest in the left parietal lobe region. No acute infarct evident. No mass, hemorrhage, or appreciable extra-axial fluid collections. Multiple foci of arterial vascular calcification evident. Electronically Signed   By: Lowella Grip III M.D.   On: 02/28/2021 16:06    Medications: . sodium chloride     . sodium chloride    Intravenous Once  . sodium chloride   Intravenous Once  . amiodarone  200 mg Oral Daily  . calcitRIOL  0.5 mcg Oral Q T,Th,Sa-HD  . calcium acetate  667 mg Oral TID WC  . Chlorhexidine Gluconate Cloth  6 each Topical Q0600  . Chlorhexidine Gluconate Cloth  6 each Topical Q0600  . darbepoetin (ARANESP) injection - DIALYSIS  100 mcg Intravenous Q Sat-HD  . feeding supplement (NEPRO CARB STEADY)  237 mL Oral TID BM  . levETIRAcetam  500 mg Oral BID  . metoprolol tartrate  25 mg Oral BID  . midodrine  10 mg Oral BID WC  . multivitamin  1 tablet Oral QHS  . pantoprazole  40 mg Oral Daily  . rosuvastatin  10 mg Oral Daily    Dialysis Orders: TTS [facility]  4h 400/500  37.5kg  2/2 bath  RUA AVF/ RIJ TDC  - sensipar 90 po tiw, on hold from 4/14  - mircera 150 q2 last 4.28  - calcitriol 1.25 ug tiw  Assessment/Plan: 1. Shock - acute, hypovolemic w/ acute blood loss after AVF infiltrated at  OP HD. VVS consulted, doubts AVF bleed would account for such large drop in Hb given her exam. Hb now 6.8-receiving 2 units PRBCs during HD today-received 1 unit PRBC 5/13 (total 3 units since admission). Blood pressures currently stable on HD-will monitor. BP's at OP HD were low in the 70's during the initial event/ syncopal episode. R arm findings do not match a 3-4 gm drop in Hb. After 3u prbc's here Hb today is again low at 6.8. Repeating per primary MD, have d/w pmd.  2. ESRD - on HD TTS. HD today-patient over EDW by 7kg-UF 5-5.5L as tolerated-blood pressures currently stable-receiving blood transfusion-will monitor. Did not tolerate 5L UF, 2 L removed today.  3. Seizures- takes keppra 4. Atrial fib - on amio, holding BP w/ low BP's. Not on AC due to prior ICH, hx of anemia 5. 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  Tobie Poet, NP [facility] 03/02/2021,10:04 AM  LOS: 2 days   Pt  seen, examined and agree w assess/plan as above with additions as indicated. [facility] 03/02/2021, 3:10 PM

## 2021-03-02 NOTE — Plan of Care (Signed)
  Problem: Medication: Goal: Risk for medication side effects will decrease Outcome: Progressing   

## 2021-03-02 NOTE — Progress Notes (Addendum)
PROGRESS NOTE                                                                                                                                                                                                             Patient Demographics:    [name], is a 60 y.o. female, DOB - 18-Jul-1961, [id number]  Outpatient Primary MD for the patient is Pcp, No    LOS - 2  Admit date - 02/28/2021    Chief Complaint  Patient presents with  . Bleeding/Bruising       Brief Narrative (HPI from H&P) - The patient is a 60 y.o. year-old w/ hx of PAD bilat BKA, smoker, hx CVA (2015), [place] (2013), seizures, PAF, chronic memory issues, HL, HTN, ESRD on HD, CAD who presented to ED from HD unit after R arm AVF infiltrated and pt's BP dropped with AMS at the unit, Hb in the ER returned at 7.4 which was down from 13 in Feb 2022, she admitted to ICU got 1 unit PRBC, seen by VVS, stabilized and transferred to [facility] on 03/02/21 with HB 6.9.   Subjective:    [name] today has, No headache, No chest pain, No abdominal pain - No Nausea, No new weakness tingling or numbness, no SOB   Assessment  & Plan :     1. Acute metabolic encephalopathy at HD unit with hypotension and acute drop in hemoglobin - there was a question if her right arm AV fistula played, she also has history of underlying seizures and subdural hematoma.  Her mental status is close to her baseline right now and she denies any headache, HB is 6.9 with no clear source of ongoing acute bleeding, PPI, will check DIC panel, LDH, haptoglobin, Occ Stool,  and CT abdomen pelvis to rule out any retroperitoneal loss, check echocardiogram to ensure Valves are stable.  Will monitor stools.  For now she has received 1 unit of packed RBC in the ICU and will give her 2 more units with HD on 03/02/2021. PPI,  Monitor H&H closely.  2.  History of seizures, bilateral SDH, VP shunt.  No headache,  supple neck, continue antiseizure medication Keppra which he takes at home.  Monitor closely.  Question if she had a seizure activity at the dialysis unit brought on by hypotension during HD which caused  her AMS.  3.  ESRD.  On TTS schedule.  Renal following.  Getting dialyzed through right IJ dialysis catheter.  Right AV fistula has infiltrated she has been seen by PVS and fistula has been repaired and being given rest at this time  4.  Right arm AV fistula infiltration.  Seen by vascular surgery.  5.  Severe failure to thrive and deconditioning - PT OT, will require SNF.  6.  Acute anemia on top of anemia of chronic disease.  Last unclear if she lost significant amount of blood through the AV fistula infiltration, plan as in #1 above.  Monitor H&H.  7. [id number] metoprolol/amiodarone-not a candidate for anticoagulation given ICH/SAH/anemia  8. H/O RLE DVT-Age indeterminate, not a candidate for anticoagulation due to subdural bleeds, previous admissionsIR was consulted for IVC filter-but was not recommended.  9. Hyperthyroidism:On beta-blocker-apparently methimazole was discontinued last admit - repeat TSH along with free T4 and T3.       Condition - Extremely Guarded  Family Communication  :    Daughter [name] [phone] - number disconnected  Daughter [name] [phone] - 03/02/21 message left at 10:16 AM  Sister [name] [phone] on 03/02/21 message left at 10:17 AM    Code Status :  Full  Consults  :  PCCM, Renal, VVS  PUD Prophylaxis : PPI   Procedures  :     CT Head - Stable postoperative changes. Stable position of ventriculoperitoneal shunt with tip posterior to the third ventricle in the left midbrain region. Stable ventricular enlargement. Multiple prior infarcts, largest in the left parietal lobe region. No acute infarct evident. No mass, hemorrhage, or appreciable extra-axial fluid collections.      Disposition Plan  :    Status is:  Inpatient  Remains inpatient appropriate because:IV treatments appropriate due to intensity of illness or inability to take PO   Dispo: The patient is from: Home              Anticipated d/c is to: Home              Patient currently is not medically stable to d/c.   Difficult to place patient No   DVT Prophylaxis  :    Place and maintain sequential compression device Start: 03/01/21 0910 SCDs Start: 02/28/21 1758   Lab Results  Component Value Date   PLT 153 03/02/2021    Diet :  Diet Order            Diet renal with fluid restriction Fluid restriction: 2000 mL Fluid; Room service appropriate? Yes; Fluid consistency: Thin  Diet effective now                  Inpatient Medications  Scheduled Meds: . sodium chloride   Intravenous Once  . sodium chloride   Intravenous Once  . amiodarone  200 mg Oral Daily  . calcitRIOL  0.5 mcg Oral Q T,Th,Sa-HD  . calcium acetate  667 mg Oral TID WC  . Chlorhexidine Gluconate Cloth  6 each Topical Q0600  . Chlorhexidine Gluconate Cloth  6 each Topical Q0600  . darbepoetin (ARANESP) injection - DIALYSIS  100 mcg Intravenous Q Sat-HD  . feeding supplement (NEPRO CARB STEADY)  237 mL Oral TID BM  . levETIRAcetam  500 mg Oral BID  . metoprolol tartrate  25 mg Oral BID  . midodrine  10 mg Oral BID WC  . multivitamin  1 tablet Oral QHS  . pantoprazole  40 mg Oral Daily  .  rosuvastatin  10 mg Oral Daily   Continuous Infusions: . sodium chloride     PRN Meds:.docusate sodium, ondansetron (ZOFRAN) IV, polyethylene glycol  Antibiotics  :    Anti-infectives (From admission, onward)   Start     Dose/Rate Route Frequency Ordered Stop   03/02/21 1200  vancomycin (VANCOCIN) IVPB 500 mg/100 ml premix  Status:  Discontinued        500 mg 100 mL/hr over 60 Minutes Intravenous Every T-Th-Sa (Hemodialysis) 02/28/21 1747 03/01/21 0824   03/01/21 1800  ceFEPIme (MAXIPIME) 1 g in sodium chloride 0.9 % 100 mL IVPB  Status:  Discontinued         1 g 200 mL/hr over 30 Minutes Intravenous Every 24 hours 02/28/21 1747 03/01/21 0824   02/28/21 1800  vancomycin (VANCOREADY) IVPB 1000 mg/200 mL        1,000 mg 200 mL/hr over 60 Minutes Intravenous  Once 02/28/21 1716 02/28/21 1957   02/28/21 1730  ceFEPIme (MAXIPIME) 2 g in sodium chloride 0.9 % 100 mL IVPB        2 g 200 mL/hr over 30 Minutes Intravenous  Once 02/28/21 1716 02/28/21 1855   02/28/21 1730  metroNIDAZOLE (FLAGYL) IVPB 500 mg        500 mg 100 mL/hr over 60 Minutes Intravenous  Once 02/28/21 1716 02/28/21 1938       Time Spent in minutes  30   Lala Lund M.D on 03/02/2021 at 9:50 AM  To page go to [email or web address]   [facility] -  Office  [phone]   See all Orders from today for further details    Objective:   Vitals:   03/02/21 0916 03/02/21 0917 03/02/21 0931 03/02/21 0948  BP: (!) 94/55 (!) 88/50 137/65 108/64  Pulse: 80 80 77 77  Resp:  '12 19 14  '$ Temp:  97.7 F (36.5 C) 98.4 F (36.9 C) 98.3 F (36.8 C)  TempSrc:  Oral Axillary Axillary  SpO2:      Weight:      Height:        Wt Readings from Last 3 Encounters:  03/02/21 44.5 kg  11/30/20 47.2 kg  11/27/20 40.4 kg     Intake/Output Summary (Last 24 hours) at 03/02/2021 0950 Last data filed at 03/02/2021 0505 Gross per 24 hour  Intake 200 ml  Output 250 ml  Net -50 ml     Physical Exam  Awake Alert, No new F.N deficits, R. IJ HD Cath [place].AT,PERRAL Supple Neck,No JVD, No cervical lymphadenopathy appriciated.  Symmetrical Chest wall movement, Good air movement bilaterally, CTAB RRR,No Gallops,Rubs or new Murmurs, No Parasternal Heave +ve B.Sounds, Abd Soft, No tenderness, No organomegaly appriciated, No rebound - guarding or rigidity. No Cyanosis, Clubbing or edema, No new Rash or bruise      Data Review:    CBC Recent Labs  Lab 02/28/21 1406 03/01/21 0046 03/02/21 0725  WBC 4.9 17.7* 7.1  HGB 7.4* 8.9* 6.8*  HCT 25.5* 27.9* 20.9*  PLT 155 148* 153  MCV 87.3  84.3 82.9  MCH 25.3* 26.9 27.0  MCHC 29.0* 31.9 32.5  RDW 19.3* 17.1* 17.5*  LYMPHSABS 1.3  --   --   MONOABS 0.2  --   --   EOSABS 0.0  --   --   BASOSABS 0.0  --   --     Recent Labs  Lab 02/28/21 1406 02/28/21 1541 02/28/21 1606 03/01/21 0046 03/02/21 0833  NA 134*  --   --  135 134*  K 3.4*  --   --  3.7 3.6  CL 94*  --   --  96* 96*  CO2 26  --   --  27 29  GLUCOSE 121*  --   --  93 87  BUN 15  --   --  17 22*  CREATININE 4.37*  --   --  4.12* 4.79*  CALCIUM 9.0  --   --  9.2 9.2  AST 18  --   --   --   --   ALT 8  --   --   --   --   ALKPHOS 54  --   --   --   --   BILITOT 0.7  --   --   --   --   ALBUMIN 1.5*  --   --   --  1.3*  MG  --   --   --  2.0  --   LATICACIDVEN 8.1*  --  8.7*  --   --   INR  --  1.3*  --   --   --     ------------------------------------------------------------------------------------------------------------------ No results for input(s): CHOL, HDL, LDLCALC, TRIG, CHOLHDL, LDLDIRECT in the last 72 hours.  Lab Results  Component Value Date   HGBA1C 5.2 10/07/2020   ------------------------------------------------------------------------------------------------------------------ No results for input(s): TSH, T4TOTAL, T3FREE, THYROIDAB in the last 72 hours.  Invalid input(s): FREET3  Cardiac Enzymes No results for input(s): CKMB, TROPONINI, MYOGLOBIN in the last 168 hours.  Invalid input(s): CK ------------------------------------------------------------------------------------------------------------------    Component Value Date/Time   BNP >4,500.0 (H) 11/13/2020 1027    Micro Results Recent Results (from the past 240 hour(s))  Resp Panel by RT-PCR (Flu A&B, Covid) Nasopharyngeal Swab     Status: None   Collection Time: 02/28/21  5:26 PM   Specimen: Nasopharyngeal Swab; Nasopharyngeal(NP) swabs in vial transport medium  Result Value Ref Range Status   SARS Coronavirus 2 by RT PCR NEGATIVE NEGATIVE Final    Comment:  (NOTE) SARS-CoV-2 target nucleic acids are NOT DETECTED.  The SARS-CoV-2 RNA is generally detectable in upper respiratory specimens during the acute phase of infection. The lowest concentration of SARS-CoV-2 viral copies this assay can detect is 138 copies/mL. A negative result does not preclude SARS-Cov-2 infection and should not be used as the sole basis for treatment or other patient management decisions. A negative result may occur with  improper specimen collection/handling, submission of specimen other than nasopharyngeal swab, presence of viral mutation(s) within the areas targeted by this assay, and inadequate number of viral copies(<138 copies/mL). A negative result must be combined with clinical observations, patient history, and epidemiological information. The expected result is Negative.  Fact Sheet for Patients:  EntrepreneurPulse.com.au  Fact Sheet for Healthcare Providers:  IncredibleEmployment.be  This test is no t yet approved or cleared by the [place] FDA and  has been authorized for detection and/or diagnosis of SARS-CoV-2 by FDA under an Emergency Use Authorization (EUA). This EUA will remain  in effect (meaning this test can be used) for the duration of the COVID-19 declaration under Section 564(b)(1) of the Act, 21 U.S.C.section 360bbb-3(b)(1), unless the authorization is terminated  or revoked sooner.       Influenza A by PCR NEGATIVE NEGATIVE Final   Influenza B by PCR NEGATIVE NEGATIVE Final    Comment: (NOTE) The Xpert Xpress SARS-CoV-2/FLU/RSV plus assay is intended as an aid in the diagnosis of influenza from Nasopharyngeal swab specimens and  should not be used as a sole basis for treatment. Nasal washings and aspirates are unacceptable for Xpert Xpress SARS-CoV-2/FLU/RSV testing.  Fact Sheet for Patients: EntrepreneurPulse.com.au  Fact Sheet for Healthcare  Providers: IncredibleEmployment.be  This test is not yet approved or cleared by the [place] FDA and has been authorized for detection and/or diagnosis of SARS-CoV-2 by FDA under an Emergency Use Authorization (EUA). This EUA will remain in effect (meaning this test can be used) for the duration of the COVID-19 declaration under Section 564(b)(1) of the Act, 21 U.S.C. section 360bbb-3(b)(1), unless the authorization is terminated or revoked.  Performed at [facility], [place] [address], [place], [place] [address]   MRSA PCR Screening     Status: None   Collection Time: 02/28/21  7:40 PM   Specimen: Nasopharyngeal  Result Value Ref Range Status   MRSA by PCR NEGATIVE NEGATIVE Final    Comment:        The GeneXpert MRSA Assay (FDA approved for NASAL specimens only), is one component of a comprehensive MRSA colonization surveillance program. It is not intended to diagnose MRSA infection nor to guide or monitor treatment for MRSA infections. Performed at [facility], [place] [address] [place], [place] [address]     Radiology Reports DG Chest 1 View  Result Date: 02/28/2021 CLINICAL DATA:  The patient became unresponsive during dialysis today. EXAM: CHEST  1 VIEW COMPARISON:  11/28/2020 FINDINGS: Stable right jugular double-lumen catheter, ventriculoperitoneal shunt tubing and enlarged cardiac silhouette. The aorta remains tortuous and partially calcified. No significant change in a moderate-sized right pleural effusion. Decreased associated right basilar atelectasis. Clear left lung. Diffuse osteopenia. IMPRESSION: 1. No acute abnormality. 2. Stable moderate-sized right pleural effusion. 3. Stable cardiomegaly and decreased right basilar atelectasis. Electronically Signed   By: Claudie Revering M.D.   On: 02/28/2021 14:43   CT Head Wo Contrast  Result Date: 02/28/2021 CLINICAL DATA:  Altered mental status EXAM: CT HEAD WITHOUT CONTRAST TECHNIQUE:  Contiguous axial images were obtained from the base of the skull through the vertex without intravenous contrast. COMPARISON:  November 28, 2020, November 19, 2020 common November 17, 2020 FINDINGS: Brain: There is a ventriculostomy catheter inserted from the right frontal region with the tip in the left mid brain slightly posterior to the third ventricle, unchanged. There remains moderate generalized ventricular prominence. There is a degree of underlying sulcal atrophy, stable. There is no intracranial mass or hemorrhage. Previous small extra-axial fluid collections are no longer evident. No subdural or epidural fluid collections are appreciable currently. There is no midline shift. There is evidence of encephalomalacia in the left parietal lobe consistent with prior infarct, stable. There is small vessel disease throughout the centra semiovale bilaterally, stable. There are posterior cerebellar infarcts bilaterally, stable. There is evidence of a prior infarct involving the anterior limbs of the left external and extreme capsules with infarct in the anterior left claustrum. No acute appearing infarct is evident. Vascular: Apparent apparent coil at basilar tip level is stable. There is a clip in the right middle cerebral artery distribution. There is calcification in each carotid siphon region. There is also calcification in each distal vertebral artery. No hyperdense vessel evident. Skull: Evidence of previous craniotomy on the right in the mid to posterior frontal region. No new bone lesions. Sinuses/Orbits: Paranasal sinuses clear. Orbits appear symmetric bilaterally. Other: Mastoid air cells clear. IMPRESSION: Stable postoperative changes. Stable position of ventriculoperitoneal shunt with tip posterior to the third ventricle in the  left midbrain region. Stable ventricular enlargement. Multiple prior infarcts, largest in the left parietal lobe region. No acute infarct evident. No mass, hemorrhage, or appreciable  extra-axial fluid collections. Multiple foci of arterial vascular calcification evident. Electronically Signed   By: Lowella Grip III M.D.   On: 02/28/2021 16:06

## 2021-03-02 NOTE — Procedures (Signed)
   I was present at this dialysis session, have reviewed the session itself and made  appropriate changes Kelly Splinter MD [place] pager [phone]   03/02/2021, 3:13 PM

## 2021-03-02 NOTE — Progress Notes (Signed)
Patient just returned from dialysis.  She states that she feels much better.  Per Linna Hoff (Dialysis RN):  Dialysis lasted 3 1/2 hours.  Was stopped short of 4 hours d/t patient being hypotensive.  2 L of fluid was removed.  Also d/t dritical hgb value of 6.8, patient had 2 units of prbc while in dialysis.  Retook vitals and entered in system.

## 2021-03-02 NOTE — Progress Notes (Signed)
Sent note to Dr. Candiss Norse:  [name] is in dialysis.  I just received a critical lab value, hgb = 6.8

## 2021-03-03 ENCOUNTER — Inpatient Hospital Stay (HOSPITAL_COMMUNITY): Payer: 59

## 2021-03-03 DIAGNOSIS — R571 Hypovolemic shock: Secondary | ICD-10-CM | POA: Diagnosis not present

## 2021-03-03 DIAGNOSIS — I5031 Acute diastolic (congestive) heart failure: Secondary | ICD-10-CM

## 2021-03-03 LAB — CBC
HCT: 33.8 % — ABNORMAL LOW (ref 36.0–46.0)
HCT: 36.9 % (ref 36.0–46.0)
Hemoglobin: 11.6 g/dL — ABNORMAL LOW (ref 12.0–15.0)
Hemoglobin: 12.3 g/dL (ref 12.0–15.0)
MCH: 27.8 pg (ref 26.0–34.0)
MCH: 27.2 pg (ref 26.0–34.0)
MCHC: 34.3 g/dL (ref 30.0–36.0)
MCHC: 33.3 g/dL (ref 30.0–36.0)
MCV: 80.9 fL (ref 80.0–100.0)
MCV: 81.6 fL (ref 80.0–100.0)
Platelets: 116 10*3/uL — ABNORMAL LOW (ref 150–400)
Platelets: 127 10*3/uL — ABNORMAL LOW (ref 150–400)
RBC: 4.18 MIL/uL (ref 3.87–5.11)
RBC: 4.52 MIL/uL (ref 3.87–5.11)
RDW: 17.2 % — ABNORMAL HIGH (ref 11.5–15.5)
RDW: 17.2 % — ABNORMAL HIGH (ref 11.5–15.5)
WBC: 8.8 10*3/uL (ref 4.0–10.5)
WBC: 8.7 10*3/uL (ref 4.0–10.5)
nRBC: 0.5 % — ABNORMAL HIGH (ref 0.0–0.2)
nRBC: 0 % (ref 0.0–0.2)

## 2021-03-03 LAB — BPAM RBC
Blood Product Expiration Date: 202206042359
Blood Product Expiration Date: 202206042359
Blood Product Expiration Date: 202206072359
ISSUE DATE / TIME: 202205121603
ISSUE DATE / TIME: 202205140914
ISSUE DATE / TIME: 202205140914
Unit Type and Rh: 5100
Unit Type and Rh: 6200
Unit Type and Rh: 6200

## 2021-03-03 LAB — CBC WITH DIFFERENTIAL/PLATELET
Abs Immature Granulocytes: 0.04 10*3/uL (ref 0.00–0.07)
Basophils Absolute: 0 10*3/uL (ref 0.0–0.1)
Basophils Relative: 0 %
Eosinophils Absolute: 0 10*3/uL (ref 0.0–0.5)
Eosinophils Relative: 0 %
HCT: 35.8 % — ABNORMAL LOW (ref 36.0–46.0)
Hemoglobin: 12.2 g/dL (ref 12.0–15.0)
Immature Granulocytes: 1 %
Lymphocytes Relative: 17 %
Lymphs Abs: 1.4 10*3/uL (ref 0.7–4.0)
MCH: 27.7 pg (ref 26.0–34.0)
MCHC: 34.1 g/dL (ref 30.0–36.0)
MCV: 81.4 fL (ref 80.0–100.0)
Monocytes Absolute: 0.4 10*3/uL (ref 0.1–1.0)
Monocytes Relative: 4 %
Neutro Abs: 6.5 10*3/uL (ref 1.7–7.7)
Neutrophils Relative %: 78 %
Platelets: 108 10*3/uL — ABNORMAL LOW (ref 150–400)
RBC: 4.4 MIL/uL (ref 3.87–5.11)
RDW: 17 % — ABNORMAL HIGH (ref 11.5–15.5)
WBC: 8.3 10*3/uL (ref 4.0–10.5)
nRBC: 0.2 % (ref 0.0–0.2)

## 2021-03-03 LAB — TYPE AND SCREEN
ABO/RH(D): A POS
Antibody Screen: NEGATIVE
Unit division: 0
Unit division: 0
Unit division: 0

## 2021-03-03 LAB — BASIC METABOLIC PANEL
Anion gap: 8 (ref 5–15)
BUN: 15 mg/dL (ref 6–20)
CO2: 29 mmol/L (ref 22–32)
Calcium: 9.3 mg/dL (ref 8.9–10.3)
Chloride: 96 mmol/L — ABNORMAL LOW (ref 98–111)
Creatinine, Ser: 3.56 mg/dL — ABNORMAL HIGH (ref 0.44–1.00)
GFR, Estimated: 14 mL/min — ABNORMAL LOW (ref >60)
Glucose, Bld: 88 mg/dL (ref 70–99)
Potassium: 3.5 mmol/L (ref 3.5–5.1)
Sodium: 133 mmol/L — ABNORMAL LOW (ref 135–145)

## 2021-03-03 LAB — ECHOCARDIOGRAM COMPLETE
Area-P 1/2: 4.8 cm2
Height: 61 in
S' Lateral: 2.8 cm
Weight: 1679.02 oz

## 2021-03-03 NOTE — Progress Notes (Signed)
PROGRESS NOTE                                                                                                                                                                                                             Patient Demographics:    [name], is a 60 y.o. female, DOB - [birth year], [id number]  Outpatient Primary MD for the patient is Pcp, No    LOS - 3  Admit date - 02/28/2021    Chief Complaint  Patient presents with  . Bleeding/Bruising       Brief Narrative (HPI from H&P) - The patient is a 60 y.o. year-old w/ hx of PAD bilat BKA, smoker, hx CVA (2015), [place] (2013), seizures, PAF, chronic memory issues, HL, HTN, ESRD on HD, CAD who presented to ED from HD unit after R arm AVF infiltrated and pt's BP dropped with AMS at the unit, Hb in the ER returned at 7.4 which was down from 13 in Feb 2022, she admitted to ICU got 1 unit PRBC, seen by VVS, stabilized and transferred to [facility] on 03/02/21 with HB 6.9.   Subjective:   Patient is sleeping but easily arousable, he is minimally verbal but denies any headache. unreliable historian, this seems to be her baseline discussing with nephrology.   Assessment  & Plan :     1. Acute metabolic encephalopathy at HD unit with hypotension and acute drop in hemoglobin - there was a question if her right arm AV fistula played, she also has history of underlying seizures and subdural hematoma.  Her mental status is close to her baseline right now and she denies any headache, HB is 6.9 with no clear source of ongoing acute bleeding, PPI, will check DIC panel, LDH, haptoglobin, Occ Stool,  and CT abdomen pelvis to rule out any retroperitoneal loss, check echocardiogram to ensure Valves are stable.  Will monitor stools.  For now she has received 1 unit of packed RBC in the ICU and will give her 2 more units with HD on 03/02/2021. PPI,  Monitor H&H closely.  2.  History of seizures,  bilateral SDH, VP shunt.  No headache, supple neck, continue antiseizure medication Keppra which he takes at home.  Monitor closely.  Question if she had a fluid imbalance induced hypotension causing seizure activity at the dialysis unit causing her  AMS.  3.  Acute anemia on top of anemia of chronic disease.  Unclear cause question if lab error showed extremely low H&H, after 2 units of packed RBC on 03/02/2021 her hemoglobin has gone from 6 to 11, CT scan abdomen pelvis was without any retroperitoneal bleed, no signs of obvious ongoing bleeding.  Will monitor  4.  Right arm AV fistula infiltration.  Seen by vascular surgery.  5.  Severe failure to thrive and deconditioning - PT OT, will require SNF.  6. ESRD.  On TTS schedule.  Renal following.  Getting dialyzed through right IJ dialysis catheter.  Right AV fistula has infiltrated she has been seen by PVS and fistula has been repaired and being given rest at this time.   7. [id number] metoprolol/amiodarone-not a candidate for anticoagulation given ICH/SAH/anemia  8. H/O RLE DVT-Age indeterminate, not a candidate for anticoagulation due to subdural bleeds, previous admissionsIR was consulted for IVC filter-but was not recommended.  9. Hyperthyroidism:On beta-blocker-apparently methimazole was discontinued last admit - stable TSH, needs outpatient endocrine follow-up post discharge.       Condition - Extremely Guarded  Family Communication  :    Daughter [name] [phone] - number disconnected  Daughter [name] [phone] - 03/02/21 message left at 10:16 AM  Sister [name] [phone] on 03/02/21 message left at 10:17 AM, message left again on 03/03/2021 at 10:51 AM   Code Status :  Full  Consults  :  PCCM, Renal, VVS  PUD Prophylaxis : PPI   Procedures  :     TTE  CT Bad pelvis - No RP bleed. - 1. No evidence of retroperitoneal hemorrhage. 2. Large volume simple fluid attenuation ascites throughout the abdomen and  pelvis. Anasarca. 3. Moderate bilateral pleural effusions and associated atelectasis or consolidation. 4. Cardiomegaly and coronary artery disease. 5. Numerous low-attenuation lesions throughout the liver, incompletely characterized although most likely simple cysts. Aortic Atherosclerosis.  CT Head - Stable postoperative changes. Stable position of ventriculoperitoneal shunt with tip posterior to the third ventricle in the left midbrain region. Stable ventricular enlargement. Multiple prior infarcts, largest in the left parietal lobe region. No acute infarct evident. No mass, hemorrhage, or appreciable extra-axial fluid collections.      Disposition Plan  :    Status is: Inpatient  Remains inpatient appropriate because:IV treatments appropriate due to intensity of illness or inability to take PO   Dispo: The patient is from: Home              Anticipated d/c is to: Home              Patient currently is not medically stable to d/c.   Difficult to place patient No   DVT Prophylaxis  :    Place and maintain sequential compression device Start: 03/01/21 0910 SCDs Start: 02/28/21 1758   Lab Results  Component Value Date   PLT 116 (L) 03/03/2021    Diet :  Diet Order            Diet renal with fluid restriction Fluid restriction: 2000 mL Fluid; Room service appropriate? Yes; Fluid consistency: Thin  Diet effective now                  Inpatient Medications  Scheduled Meds: . sodium chloride   Intravenous Once  . sodium chloride   Intravenous Once  . amiodarone  200 mg Oral Daily  . calcitRIOL  0.5 mcg Oral Q T,Th,Sa-HD  . calcium acetate  667 mg Oral  TID WC  . Chlorhexidine Gluconate Cloth  6 each Topical Q0600  . Chlorhexidine Gluconate Cloth  6 each Topical Q0600  . feeding supplement (NEPRO CARB STEADY)  237 mL Oral TID BM  . levETIRAcetam  500 mg Oral BID  . metoprolol tartrate  25 mg Oral BID  . midodrine  10 mg Oral BID WC  . multivitamin  1 tablet Oral QHS  .  pantoprazole  40 mg Oral Daily  . rosuvastatin  10 mg Oral Daily   Continuous Infusions: . sodium chloride     PRN Meds:.docusate sodium, heparin sodium (porcine), ondansetron (ZOFRAN) IV, polyethylene glycol  Antibiotics  :    Anti-infectives (From admission, onward)   Start     Dose/Rate Route Frequency Ordered Stop   03/02/21 1200  vancomycin (VANCOCIN) IVPB 500 mg/100 ml premix  Status:  Discontinued        500 mg 100 mL/hr over 60 Minutes Intravenous Every T-Th-Sa (Hemodialysis) 02/28/21 1747 03/01/21 0824   03/01/21 1800  ceFEPIme (MAXIPIME) 1 g in sodium chloride 0.9 % 100 mL IVPB  Status:  Discontinued        1 g 200 mL/hr over 30 Minutes Intravenous Every 24 hours 02/28/21 1747 03/01/21 0824   02/28/21 1800  vancomycin (VANCOREADY) IVPB 1000 mg/200 mL        1,000 mg 200 mL/hr over 60 Minutes Intravenous  Once 02/28/21 1716 02/28/21 1957   02/28/21 1730  ceFEPIme (MAXIPIME) 2 g in sodium chloride 0.9 % 100 mL IVPB        2 g 200 mL/hr over 30 Minutes Intravenous  Once 02/28/21 1716 02/28/21 1855   02/28/21 1730  metroNIDAZOLE (FLAGYL) IVPB 500 mg        500 mg 100 mL/hr over 60 Minutes Intravenous  Once 02/28/21 1716 02/28/21 1938       Time Spent in minutes  30   Lala Lund M.D on 03/03/2021 at 10:51 AM  To page go to [email or web address]   [facility] -  Office  [phone]   See all Orders from today for further details    Objective:   Vitals:   03/03/21 0000 03/03/21 0500 03/03/21 0507 03/03/21 0610  BP:   (!) 140/58   Pulse:   64   Resp:   16 18  Temp:   98 F (36.7 C)   TempSrc:   Oral   SpO2: 100%  100%   Weight:  44.4 kg  47.6 kg  Height:        Wt Readings from Last 3 Encounters:  03/03/21 47.6 kg  11/30/20 47.2 kg  11/27/20 40.4 kg     Intake/Output Summary (Last 24 hours) at 03/03/2021 1051 Last data filed at 03/03/2021 0400 Gross per 24 hour  Intake 640 ml  Output 2086 ml  Net -1446 ml     Physical Exam  Awake, does not  speak much, moves all 4 extremities to commands, R. IJ HD Cath intact, right arm AV fistula site stable [place].AT,PERRAL Supple Neck,No JVD, No cervical lymphadenopathy appriciated.  Symmetrical Chest wall movement, Good air movement bilaterally, CTAB RRR,No Gallops, Rubs or new Murmurs, No Parasternal Heave +ve B.Sounds, Abd Soft, No tenderness, No organomegaly appriciated, No rebound - guarding or rigidity. No Cyanosis, Clubbing or edema, No new Rash or bruise    Data Review:    CBC Recent Labs  Lab 02/28/21 1406 03/01/21 0046 03/02/21 0725 03/02/21 0947 03/02/21 1745 03/02/21 1907 03/03/21 0150 03/03/21 0756  WBC 4.9   < >  7.1  --  13.6* 6.8 8.3 8.8  HGB 7.4*   < > 6.8*  --  13.5 15.6* 12.2 11.6*  HCT 25.5*   < > 20.9*  --  38.5 45.7 35.8* 33.8*  PLT 155   < > 153 152 105* 100* 108* 116*  MCV 87.3   < > 82.9  --  80.2 81.5 81.4 80.9  MCH 25.3*   < > 27.0  --  28.1 27.8 27.7 27.8  MCHC 29.0*   < > 32.5  --  35.1 34.1 34.1 34.3  RDW 19.3*   < > 17.5*  --  16.4* 17.3* 17.0* 17.2*  LYMPHSABS 1.3  --   --   --   --   --  1.4  --   MONOABS 0.2  --   --   --   --   --  0.4  --   EOSABS 0.0  --   --   --   --   --  0.0  --   BASOSABS 0.0  --   --   --   --   --  0.0  --    < > = values in this interval not displayed.    Recent Labs  Lab 02/28/21 1406 02/28/21 1541 02/28/21 1606 03/01/21 0046 03/02/21 0833 03/02/21 0947 03/02/21 1010 03/03/21 0150  NA 134*  --   --  135 134*  --   --  133*  K 3.4*  --   --  3.7 3.6  --   --  3.5  CL 94*  --   --  96* 96*  --   --  96*  CO2 26  --   --  27 29  --   --  29  GLUCOSE 121*  --   --  93 87  --   --  88  BUN 15  --   --  17 22*  --   --  15  CREATININE 4.37*  --   --  4.12* 4.79*  --   --  3.56*  CALCIUM 9.0  --   --  9.2 9.2  --   --  9.3  AST 18  --   --   --   --   --   --   --   ALT 8  --   --   --   --   --   --   --   ALKPHOS 54  --   --   --   --   --   --   --   BILITOT 0.7  --   --   --   --   --   --   --    ALBUMIN 1.5*  --   --   --  1.3*  --   --   --   MG  --   --   --  2.0  --   --   --   --   DDIMER  --   --   --   --   --  14.76*  --   --   LATICACIDVEN 8.1*  --  8.7*  --   --   --   --   --   INR  --  1.3*  --   --   --  1.1  --   --   TSH  --   --   --   --   --   --  2.244  --     ------------------------------------------------------------------------------------------------------------------ No results for input(s): CHOL, HDL, LDLCALC, TRIG, CHOLHDL, LDLDIRECT in the last 72 hours.  Lab Results  Component Value Date   HGBA1C 5.2 10/07/2020   ------------------------------------------------------------------------------------------------------------------ Recent Labs    03/02/21 1010  TSH 2.244    Cardiac Enzymes No results for input(s): CKMB, TROPONINI, MYOGLOBIN in the last 168 hours.  Invalid input(s): CK ------------------------------------------------------------------------------------------------------------------    Component Value Date/Time   BNP >4,500.0 (H) 11/13/2020 1027    Micro Results Recent Results (from the past 240 hour(s))  Resp Panel by RT-PCR (Flu A&B, Covid) Nasopharyngeal Swab     Status: None   Collection Time: 02/28/21  5:26 PM   Specimen: Nasopharyngeal Swab; Nasopharyngeal(NP) swabs in vial transport medium  Result Value Ref Range Status   SARS Coronavirus 2 by RT PCR NEGATIVE NEGATIVE Final    Comment: (NOTE) SARS-CoV-2 target nucleic acids are NOT DETECTED.  The SARS-CoV-2 RNA is generally detectable in upper respiratory specimens during the acute phase of infection. The lowest concentration of SARS-CoV-2 viral copies this assay can detect is 138 copies/mL. A negative result does not preclude SARS-Cov-2 infection and should not be used as the sole basis for treatment or other patient management decisions. A negative result may occur with  improper specimen collection/handling, submission of specimen other than nasopharyngeal  swab, presence of viral mutation(s) within the areas targeted by this assay, and inadequate number of viral copies(<138 copies/mL). A negative result must be combined with clinical observations, patient history, and epidemiological information. The expected result is Negative.  Fact Sheet for Patients:  EntrepreneurPulse.com.au  Fact Sheet for Healthcare Providers:  IncredibleEmployment.be  This test is no t yet approved or cleared by the [place] FDA and  has been authorized for detection and/or diagnosis of SARS-CoV-2 by FDA under an Emergency Use Authorization (EUA). This EUA will remain  in effect (meaning this test can be used) for the duration of the COVID-19 declaration under Section 564(b)(1) of the Act, 21 U.S.C.section 360bbb-3(b)(1), unless the authorization is terminated  or revoked sooner.       Influenza A by PCR NEGATIVE NEGATIVE Final   Influenza B by PCR NEGATIVE NEGATIVE Final    Comment: (NOTE) The Xpert Xpress SARS-CoV-2/FLU/RSV plus assay is intended as an aid in the diagnosis of influenza from Nasopharyngeal swab specimens and should not be used as a sole basis for treatment. Nasal washings and aspirates are unacceptable for Xpert Xpress SARS-CoV-2/FLU/RSV testing.  Fact Sheet for Patients: EntrepreneurPulse.com.au  Fact Sheet for Healthcare Providers: IncredibleEmployment.be  This test is not yet approved or cleared by the [place] FDA and has been authorized for detection and/or diagnosis of SARS-CoV-2 by FDA under an Emergency Use Authorization (EUA). This EUA will remain in effect (meaning this test can be used) for the duration of the COVID-19 declaration under Section 564(b)(1) of the Act, 21 U.S.C. section 360bbb-3(b)(1), unless the authorization is terminated or revoked.  Performed at [facility], [place] [address], [place], [place] [address]   MRSA PCR  Screening     Status: None   Collection Time: 02/28/21  7:40 PM   Specimen: Nasopharyngeal  Result Value Ref Range Status   MRSA by PCR NEGATIVE NEGATIVE Final    Comment:        The GeneXpert MRSA Assay (FDA approved for NASAL specimens only), is one component of a comprehensive MRSA colonization surveillance program. It is not intended to diagnose MRSA infection nor to guide  or monitor treatment for MRSA infections. Performed at [facility], [place] [address], [place], [place] [address]   Culture, blood (single)     Status: None (Preliminary result)   Collection Time: 03/01/21 12:46 AM   Specimen: BLOOD LEFT HAND  Result Value Ref Range Status   Specimen Description BLOOD LEFT HAND  Final   Special Requests   Final    BOTTLES DRAWN AEROBIC AND ANAEROBIC Blood Culture results may not be optimal due to an inadequate volume of blood received in culture bottles   Culture   Final    NO GROWTH 2 DAYS Performed at [facility], [place] [address] [place], [place] [address]    Report Status PENDING  Incomplete    Radiology Reports CT ABDOMEN PELVIS WO CONTRAST  Result Date: 03/02/2021 CLINICAL DATA:  Anemia, evaluate for retroperitoneal bleed EXAM: CT ABDOMEN AND PELVIS WITHOUT CONTRAST TECHNIQUE: Multidetector CT imaging of the abdomen and pelvis was performed following the standard protocol without IV contrast. COMPARISON:  11/09/2020 FINDINGS: Lower chest: Moderate bilateral pleural effusions and associated atelectasis or consolidation. Cardiomegaly. Extensive 3 vessel coronary artery calcifications. Hepatobiliary: No solid liver abnormality is seen. Numerous low-attenuation lesions throughout the liver, incompletely characterized although most likely simple cysts. No gallstones, gallbladder wall thickening, or biliary dilatation. Pancreas: Unremarkable. No pancreatic ductal dilatation or surrounding inflammatory changes. Spleen: Normal in size without significant abnormality.  Adrenals/Urinary Tract: Adrenal glands are unremarkable. Atrophic appearance of the kidneys. Multiple bilateral small calculi and/or vascular calcifications. No hydronephrosis. Bladder is unremarkable. Stomach/Bowel: Stomach is within normal limits. Appendix appears normal. No evidence of bowel wall thickening, distention, or inflammatory changes. Vascular/Lymphatic: Severe, pipe like aortic atherosclerosis. No enlarged abdominal or pelvic lymph nodes. Reproductive: Status post hysterectomy. Other: No abdominal wall hernia or abnormality. Anasarca. Large volume ascites throughout the abdomen and pelvis. Shunt catheter tubing gently looped around the abdomen, tip position in the posterior right hemipelvis (series 3, image 82). Musculoskeletal: No acute or significant osseous findings. IMPRESSION: 1. No evidence of retroperitoneal hemorrhage. 2. Large volume simple fluid attenuation ascites throughout the abdomen and pelvis. Anasarca. 3. Moderate bilateral pleural effusions and associated atelectasis or consolidation. 4. Cardiomegaly and coronary artery disease. 5. Numerous low-attenuation lesions throughout the liver, incompletely characterized although most likely simple cysts. Aortic Atherosclerosis (ICD10-I70.0). Electronically Signed   By: Eddie Candle M.D.   On: 03/02/2021 16:10   DG Chest 1 View  Result Date: 02/28/2021 CLINICAL DATA:  The patient became unresponsive during dialysis today. EXAM: CHEST  1 VIEW COMPARISON:  11/28/2020 FINDINGS: Stable right jugular double-lumen catheter, ventriculoperitoneal shunt tubing and enlarged cardiac silhouette. The aorta remains tortuous and partially calcified. No significant change in a moderate-sized right pleural effusion. Decreased associated right basilar atelectasis. Clear left lung. Diffuse osteopenia. IMPRESSION: 1. No acute abnormality. 2. Stable moderate-sized right pleural effusion. 3. Stable cardiomegaly and decreased right basilar atelectasis.  Electronically Signed   By: Claudie Revering M.D.   On: 02/28/2021 14:43   CT Head Wo Contrast  Result Date: 02/28/2021 CLINICAL DATA:  Altered mental status EXAM: CT HEAD WITHOUT CONTRAST TECHNIQUE: Contiguous axial images were obtained from the base of the skull through the vertex without intravenous contrast. COMPARISON:  November 28, 2020, November 19, 2020 common November 17, 2020 FINDINGS: Brain: There is a ventriculostomy catheter inserted from the right frontal region with the tip in the left mid brain slightly posterior to the third ventricle, unchanged. There remains moderate generalized ventricular prominence. There is a degree  of underlying sulcal atrophy, stable. There is no intracranial mass or hemorrhage. Previous small extra-axial fluid collections are no longer evident. No subdural or epidural fluid collections are appreciable currently. There is no midline shift. There is evidence of encephalomalacia in the left parietal lobe consistent with prior infarct, stable. There is small vessel disease throughout the centra semiovale bilaterally, stable. There are posterior cerebellar infarcts bilaterally, stable. There is evidence of a prior infarct involving the anterior limbs of the left external and extreme capsules with infarct in the anterior left claustrum. No acute appearing infarct is evident. Vascular: Apparent apparent coil at basilar tip level is stable. There is a clip in the right middle cerebral artery distribution. There is calcification in each carotid siphon region. There is also calcification in each distal vertebral artery. No hyperdense vessel evident. Skull: Evidence of previous craniotomy on the right in the mid to posterior frontal region. No new bone lesions. Sinuses/Orbits: Paranasal sinuses clear. Orbits appear symmetric bilaterally. Other: Mastoid air cells clear. IMPRESSION: Stable postoperative changes. Stable position of ventriculoperitoneal shunt with tip posterior to the third  ventricle in the left midbrain region. Stable ventricular enlargement. Multiple prior infarcts, largest in the left parietal lobe region. No acute infarct evident. No mass, hemorrhage, or appreciable extra-axial fluid collections. Multiple foci of arterial vascular calcification evident. Electronically Signed   By: Lowella Grip III M.D.   On: 02/28/2021 16:06

## 2021-03-03 NOTE — Evaluation (Addendum)
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      Patient is encephalopathic, therefore history has been obtained from chart review. Per EMS, patient began dialysis and 30 seconds after cannulation, site was noted to be infiltrating into the R chest and R arm, and became unresponsive. EMS was called and began Basic Life support ventilations, applied Tourniquet clamp, transported to hospital where she was found to be hypotensive and in shock; has recieved 3 units of PRBCs during hospital stay as of 5/15   Clinical Impression   Pt admitted with the above diagnoses and presents with below problem list. Pt will benefit from continued acute OT to address the below listed deficits and maximize independence with basic ADLs prior to d/c to venue below. Pt with unclear home setup and PLOF; unable to provide reliable history and no family present during OT eval. Currently pt is needing up to total assist with basic ADLs. Pt presents as profoundly weak and cachetic. Unable to tolerate sitting EOB more than 15 seconds; became visibly lightheaded/dizzy and needed to return to supine. Pt would benefit from Prevalon boots during her admission to protect her BLE skin integrity.     Follow Up Recommendations  SNF    Equipment Recommendations  Other (comment) (defer to next venue)    Recommendations for Other Services PT consult     Precautions / Restrictions Precautions Precautions: Fall Restrictions Weight Bearing Restrictions: No      Mobility Bed Mobility Overal bed mobility: Needs Assistance Bed Mobility: Supine to Sit;Sit to Supine Rolling: Max assist Sidelying to sit: Mod  assist Supine to sit: Total assist Sit to supine: Max assist   General bed mobility comments: total A for all aspects to get to EOB. Bed pad heavily utilized. Lightheaded/dizzy upon upon position EOB needing to lie back down quickly.    Transfers Overall transfer level: Needs assistance Equipment used: 1 person hand held assist (and bed pad) Transfers: Sit to/from Stand Sit to Stand: Max assist         General transfer comment: unable 2/2 becoming visibly lightheaded/dizzy after sitting EOB for 15 seconds at most.    Balance Overall balance assessment: Needs assistance Sitting-balance support: Feet supported Sitting balance-Leahy Scale: Poor Sitting balance - Comments: sat EOB only briefly                                   ADL either performed or assessed with clinical judgement   ADL Overall ADL's : Needs assistance/impaired Eating/Feeding: Bed level;Total assistance   Grooming: Total assistance   Upper Body Bathing: Total assistance   Lower Body Bathing: Total assistance   Upper Body Dressing : Total assistance   Lower Body Dressing: Total assistance                 General ADL Comments: Cognition, profound weakness and poor activity tolerance impacting level of assist with ADLs.     Vision         Perception     Praxis      Pertinent Vitals/Pain Pain Assessment: Faces Faces Pain Scale: Hurts little more  Pain Location: grimacing with movement. Pain Descriptors / Indicators: Grimacing Pain Intervention(s): Monitored during session;Repositioned;Limited activity within patient's tolerance     Hand Dominance Left   Extremity/Trunk Assessment Upper Extremity Assessment Upper Extremity Assessment: Generalized weakness (profoundly weak and cachetic). Difficult to fully assess RUE but pt is guarded with RUE, edema noted.   Lower Extremity Assessment Lower Extremity Assessment: Defer to PT evaluation;Generalized weakness (profoundly  weak and cachetic)       Communication Communication Communication: Expressive difficulties (baseline?)   Cognition Arousal/Alertness: Awake/alert Behavior During Therapy: Flat affect Overall Cognitive Status: No family/caregiver present to determine baseline cognitive functioning                                 General Comments: minimal, delayed verbal interactions. one to two word responses. Unable to state DOB. Able to state place as hospital. Decreased inititation (vs volition?). Functional communication impaired, difficulty letting needs be known.   General Comments  Assessed BPs in lying and sitting; no huge BP changes noted, and pt did not stand long enough to be able to get a standing BP; see vtals flowsheets    Exercises     Shoulder Instructions      Home Living Family/patient expects to be discharged to:: Skilled nursing facility   Available Help at Discharge: Family Type of Home: Apartment Home Access: Level entry     Home Layout: One level     Bathroom Shower/Tub: Astronomer Accessibility: Yes   Home Equipment:  (unknown)   Additional Comments: noted to live with daughter and uncertain if home setup accurate as pt is poor historian      Prior Functioning/Environment Level of Independence: Needs assistance  Gait / Transfers Assistance Needed: recent admission; only able to walk ~12 ft with min assist     Comments: minimal response to questions        OT Problem List: Decreased strength;Decreased activity tolerance;Impaired balance (sitting and/or standing);Decreased cognition;Decreased safety awareness;Decreased knowledge of use of DME or AE;Decreased knowledge of precautions;Cardiopulmonary status limiting activity;Pain;Impaired UE functional use      OT Treatment/Interventions: Self-care/ADL training;Therapeutic exercise;Energy conservation;DME and/or AE instruction;Therapeutic activities;Cognitive  remediation/compensation;Patient/family education;Balance training    OT Goals(Current goals can be found in the care plan section) Acute Rehab OT Goals Patient Stated Goal: did not state OT Goal Formulation: Patient unable to participate in goal setting Time For Goal Achievement: 03/17/21 Potential to Achieve Goals: Fair ADL Goals Pt Will Perform Eating: with min assist;sitting Pt Will Perform Upper Body Bathing: sitting;with mod assist Pt Will Perform Lower Body Bathing: with mod assist;sitting/lateral leans Pt Will Transfer to Toilet: with mod assist;stand pivot transfer;bedside commode Additional ADL Goal #1: Pt will complete bed mobility at min A level to prepare for EOB/OOB ADLs.  OT Frequency: Min 2X/week   Barriers to D/C: Other (comment)  currently needs up to total assist with basic ADLs.       Co-evaluation              AM-PAC OT "6 Clicks" Daily Activity     Outcome Measure Help from another person eating meals?: Total Help from another person taking care of personal grooming?: Total Help from another person toileting, which includes using toliet, bedpan, or urinal?: Total Help from another person bathing (including washing, rinsing, drying)?: Total Help from another person to put on and taking off regular upper body clothing?: Total Help  from another person to put on and taking off regular lower body clothing?: Total 6 Click Score: 6   End of Session Equipment Utilized During Treatment: Other (comment) (folded blanket between B knees and heels in sidelying position at end of session)  Activity Tolerance: Patient limited by lethargy Patient left: in bed;with call bell/phone within reach;Other (comment) (Ultrasound technician present)  OT Visit Diagnosis: Other abnormalities of gait and mobility (R26.89);Muscle weakness (generalized) (M62.81);Adult, failure to thrive (R62.7);Pain;Cognitive communication deficit (R41.841);Other symptoms and signs involving  cognitive function;Feeding difficulties (R63.3)                Time: 1113-1140 OT Time Calculation (min): 27 min Charges:  OT General Charges $OT Visit: 1 Visit OT Evaluation $OT Eval Moderate Complexity: 1 Mod OT Treatments $Self Care/Home Management : 8-22 mins  Tyrone Schimke, OT Acute Rehabilitation Services Pager: [phone] Office: [phone]   [name] 03/03/2021, 2:54 PM

## 2021-03-03 NOTE — Progress Notes (Addendum)
[facility] Progress Note   Subjective:     [name] was seen and examined today at bedside. Last HD 03/02/21-unable to tolerate 5L and only removed 2L yesterday. Patient with no complaints at this time. Denies SOB, CP, ABD pain, and N/V/D. Plan for scheduled HD on Tuesday 03/05/21 UF as tolerated.   Objective Vitals:   03/03/21 0000 03/03/21 0500 03/03/21 0507 03/03/21 0610  BP:   (!) 140/58   Pulse:   64   Resp:   16 18  Temp:   98 F (36.7 C)   TempSrc:   Oral   SpO2: 100%  100%   Weight:  44.4 kg  47.6 kg  Height:       Physical Exam General: Frail appearing; no acute respiratory distress Heart: Normal S1 and S2; No murmurs, gallops, or friction rub Lungs: Respirations unlabored; Lungs clear anteriorly; No wheezing, rales, or rhonchi Abdomen: large, round, distended (possible ascites),non-tender, active bowel sounds Extremities: Noted edema RUE and L hip; No edema bilateral lower extremities Dialysis Access: R AVF (+) Bruit/Thrill; R IJ TDC in use.  Filed Weights   03/02/21 1315 03/03/21 0500 03/03/21 0610  Weight: 41.7 kg 44.4 kg 47.6 kg    Intake/Output Summary (Last 24 hours) at 03/03/2021 0959 Last data filed at 03/03/2021 0400 Gross per 24 hour  Intake 640 ml  Output 2086 ml  Net -1446 ml    Additional Objective Labs: Basic Metabolic Panel: Recent Labs  Lab 03/01/21 0046 03/02/21 0833 03/03/21 0150  NA 135 134* 133*  K 3.7 3.6 3.5  CL 96* 96* 96*  CO2 '27 29 29  '$ GLUCOSE 93 87 88  BUN 17 22* 15  CREATININE 4.12* 4.79* 3.56*  CALCIUM 9.2 9.2 9.3  PHOS 5.4* 5.3*  --    Liver Function Tests: Recent Labs  Lab 02/28/21 1406 03/02/21 0833  AST 18  --   ALT 8  --   ALKPHOS 54  --   BILITOT 0.7  --   PROT 4.7*  --   ALBUMIN 1.5* 1.3*   No results for input(s): LIPASE, AMYLASE in the last 168 hours. CBC: Recent Labs  Lab 02/28/21 1406 03/01/21 0046 03/02/21 0725 03/02/21 0947 03/02/21 1745 03/02/21 1907 03/03/21 0150  03/03/21 0756  WBC 4.9   < > 7.1  --  13.6* 6.8 8.3 8.8  NEUTROABS 3.3  --   --   --   --   --  6.5  --   HGB 7.4*   < > 6.8*  --  13.5 15.6* 12.2 11.6*  HCT 25.5*   < > 20.9*  --  38.5 45.7 35.8* 33.8*  MCV 87.3   < > 82.9  --  80.2 81.5 81.4 80.9  PLT 155   < > 153   < > 105* 100* 108* 116*   < > = values in this interval not displayed.   Blood Culture    Component Value Date/Time   SDES BLOOD LEFT HAND 03/01/2021 0046   SPECREQUEST  03/01/2021 0046    BOTTLES DRAWN AEROBIC AND ANAEROBIC Blood Culture results may not be optimal due to an inadequate volume of blood received in culture bottles   CULT  03/01/2021 0046    NO GROWTH 1 DAY Performed at [facility], [place] [address] [place], [place] [address]    REPTSTATUS PENDING 03/01/2021 0046    Cardiac Enzymes: No results for input(s): CKTOTAL, CKMB, CKMBINDEX, TROPONINI in the last 168 hours. CBG:  Recent Labs  Lab 02/28/21 2020 02/28/21 2329  GLUCAP 77 97   Iron Studies: No results for input(s): IRON, TIBC, TRANSFERRIN, FERRITIN in the last 72 hours. Lab Results  Component Value Date   INR 1.1 03/02/2021   INR 1.3 (H) 02/28/2021   INR 1.2 11/28/2020   Studies/Results: CT ABDOMEN PELVIS WO CONTRAST  Result Date: 03/02/2021 CLINICAL DATA:  Anemia, evaluate for retroperitoneal bleed EXAM: CT ABDOMEN AND PELVIS WITHOUT CONTRAST TECHNIQUE: Multidetector CT imaging of the abdomen and pelvis was performed following the standard protocol without IV contrast. COMPARISON:  11/09/2020 FINDINGS: Lower chest: Moderate bilateral pleural effusions and associated atelectasis or consolidation. Cardiomegaly. Extensive 3 vessel coronary artery calcifications. Hepatobiliary: No solid liver abnormality is seen. Numerous low-attenuation lesions throughout the liver, incompletely characterized although most likely simple cysts. No gallstones, gallbladder wall thickening, or biliary dilatation. Pancreas: Unremarkable. No pancreatic ductal  dilatation or surrounding inflammatory changes. Spleen: Normal in size without significant abnormality. Adrenals/Urinary Tract: Adrenal glands are unremarkable. Atrophic appearance of the kidneys. Multiple bilateral small calculi and/or vascular calcifications. No hydronephrosis. Bladder is unremarkable. Stomach/Bowel: Stomach is within normal limits. Appendix appears normal. No evidence of bowel wall thickening, distention, or inflammatory changes. Vascular/Lymphatic: Severe, pipe like aortic atherosclerosis. No enlarged abdominal or pelvic lymph nodes. Reproductive: Status post hysterectomy. Other: No abdominal wall hernia or abnormality. Anasarca. Large volume ascites throughout the abdomen and pelvis. Shunt catheter tubing gently looped around the abdomen, tip position in the posterior right hemipelvis (series 3, image 82). Musculoskeletal: No acute or significant osseous findings. IMPRESSION: 1. No evidence of retroperitoneal hemorrhage. 2. Large volume simple fluid attenuation ascites throughout the abdomen and pelvis. Anasarca. 3. Moderate bilateral pleural effusions and associated atelectasis or consolidation. 4. Cardiomegaly and coronary artery disease. 5. Numerous low-attenuation lesions throughout the liver, incompletely characterized although most likely simple cysts. Aortic Atherosclerosis (ICD10-I70.0). Electronically Signed   By: Eddie Candle M.D.   On: 03/02/2021 16:10    Medications: . sodium chloride     . sodium chloride   Intravenous Once  . sodium chloride   Intravenous Once  . amiodarone  200 mg Oral Daily  . calcitRIOL  0.5 mcg Oral Q T,Th,Sa-HD  . calcium acetate  667 mg Oral TID WC  . Chlorhexidine Gluconate Cloth  6 each Topical Q0600  . Chlorhexidine Gluconate Cloth  6 each Topical Q0600  . feeding supplement (NEPRO CARB STEADY)  237 mL Oral TID BM  . levETIRAcetam  500 mg Oral BID  . metoprolol tartrate  25 mg Oral BID  . midodrine  10 mg Oral BID WC  . multivitamin  1  tablet Oral QHS  . pantoprazole  40 mg Oral Daily  . rosuvastatin  10 mg Oral Daily    Dialysis Orders: TTS [facility] 4h 400/500 37.5kg 2/2 bath RUA AVF/ RIJ TDC - sensipar 90 po tiw, on hold from 4/14 - mircera 150 q2 last 4.28 - calcitriol 1.25 ug tiw  Assessment/Plan: 1. Shock - acute, hypovolemic w/ acute blood loss after AVF infiltrated at OP HD. VVS consulted, doubts AVF bleed would account for such large drop in Hb given her exam. Hb improved now at 11.6-s/p 2 units PRBCs during HD 5/14-received 1 unit PRBC 5/13 (total 3 units since admission). Blood pressures currently stable on HD-will monitor. BP's at OP HD were low in the 70's during the initial event/ syncopal episode. R arm findings do not match a 3-4 gm drop in Hb. Managed by primary-CT ABD/Pelvis (-) for retroperitoneal hemorrhage-awaiting  ECHO to evaluate valve stability, occult stools ordered. 2. ESRD - on HD TTS. Last HD on 5/14-unable to tolerate 5L UF-only removed 2L yesterday. Blood pressures currently stable-on Midodrine. Over EDW: noted 10kg over this AM-not sure accuracy of this-post weight after yesterday's HD was 41.7kg-currently, patient is euvolemic on exam with no evidence of acute respiratory distress-plan for HD on Tuesday 5/17-UF 2-3L as tolerated.  3. Seizures- takes keppra 4. Atrial fib - on amio, holding BP w/ low BP's. Not on AC due to prior ICH, hx of anemia 5. Anemia ckd - aranesp dose given on 03/02/21. 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  Tobie Poet, NP [facility] 03/03/2021,9:59 AM  LOS: 3 days

## 2021-03-03 NOTE — Evaluation (Signed)
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      Patient is encephalopathic, therefore history has been obtained from chart review. Per EMS, patient began dialysis and 30 seconds after cannulation, site was noted to be infiltrating into the R chest and R arm, and became unresponsive. EMS was called and began Basic Life support ventilations, applied Tourniquet clamp, transported to hospital where she was found to be hypotensive and in shock; has recieved 3 units of PRBCs during hospital stay as of 5/15  Clinical Impression   Pt admitted with above diagnosis. Comes from home where she lives with her daughter in an apartment with a level entry; Per chart review, she could walk short distance with RW; Presents to PT with functional dependencies, needing max to Total assist for all facets of functional mobility  Pt currently with functional limitations due to the deficits listed below (see PT Problem List). Pt will benefit from skilled PT to increase their independence and safety with mobility to allow discharge to the venue listed below.       Follow Up Recommendations SNF    Equipment Recommendations  Rolling walker with 5" wheels;3in1 (PT)    Recommendations for Other Services       Precautions / Restrictions Precautions Precautions: Fall      Mobility  Bed Mobility Overal bed mobility: Needs Assistance Bed Mobility: Rolling;Sidelying to Sit;Sit to Supine Rolling: Max assist Sidelying to sit: Mod assist   Sit to supine: Max assist   General bed mobility comments: Lots of multimodal cues to initiate movement; Notable effort to push self up from sidelying to sit, needing  mod assist to get to fully upright; Max assist to lay back down    Transfers Overall transfer level: Needs assistance Equipment used: 1 person hand held assist (and bed pad) Transfers: Sit to/from Stand Sit to Stand: Max assist         General transfer comment: used bed pad to cradle hips and gentle knee block for stability; tending to lean posteriorly; stood approx 10 seconds with Max assist  Ambulation/Gait                Stairs            Wheelchair Mobility    Modified Rankin (Stroke Patients Only)       Balance                                             Pertinent Vitals/Pain Pain Assessment: Faces Faces Pain Scale: Hurts little more Pain Location: Grimace with taking BPs and general movement Pain Descriptors / Indicators: Grimacing Pain Intervention(s): Monitored during session;Repositioned    Home Living Family/patient expects to be discharged to:: Skilled nursing facility   Available Help at Discharge: Family Type of Home: Apartment Home Access: Level entry     Home Layout: One level Home Equipment:  (unknown) Additional Comments: noted to live with daughter and uncertain if home setup accurate as pt is poor historian    Prior Function Level of Independence: Needs assistance  Gait / Transfers Assistance Needed: recent admission; only able to walk ~12 ft with min assist     Comments: minimal response to questions     Hand Dominance   Dominant Hand: Left    Extremity/Trunk Assessment   Upper Extremity Assessment Upper Extremity Assessment: Defer to OT evaluation    Lower Extremity Assessment Lower Extremity Assessment: Generalized weakness (profoundly weak and cachetic)       Communication   Communication: Expressive difficulties;Other (comment) (Unsure exactly of baselone)  Cognition Arousal/Alertness: Lethargic Behavior During Therapy: Flat affect Overall Cognitive Status: No family/caregiver present  to determine baseline cognitive functioning                                 General Comments: Minimal verbal interaction; some yes/no answers and pt correctly answered verifiable biographical information; difficulty verbalizing wants, but could make her desire to lay back down known      General Comments General comments (skin integrity, edema, etc.): Assessed BPs in lying and sitting; no huge BP changes noted, and pt did not stand long enough to be able to get a standing BP; see vtals flowsheets    Exercises     Assessment/Plan    PT Assessment Patient needs continued PT services  PT Problem List Decreased strength;Decreased range of motion;Decreased activity tolerance;Decreased balance;Decreased mobility;Decreased coordination;Decreased cognition;Decreased knowledge of use of DME;Decreased safety awareness;Decreased knowledge of precautions;Pain       PT Treatment Interventions DME instruction;Gait training;Functional mobility training;Therapeutic activities;Therapeutic exercise;Balance training;Neuromuscular re-education;Cognitive remediation;Patient/family education    PT Goals (Current goals can be found in the Care Plan section)  Acute Rehab PT Goals Patient Stated Goal: did notstate PT Goal Formulation: With patient Time For Goal Achievement: 03/17/21 Potential to Achieve Goals: Fair    Frequency Min 2X/week   Barriers to discharge        Co-evaluation               AM-PAC PT "6 Clicks" Mobility  Outcome Measure Help needed turning from your back to your side while in a flat bed without using bedrails?: A Lot Help needed moving from lying on your back to sitting on the side of a flat bed without using bedrails?: A Lot Help needed moving to and from a bed to a chair (including a wheelchair)?: Total Help needed standing up from a chair using your arms (e.g., wheelchair or bedside chair)?: Total Help needed to walk in hospital room?: Total Help  needed climbing 3-5 steps with a railing? : Total 6 Click Score: 8    End of Session Equipment Utilized During Treatment: Other (comment) (bed pad) Activity Tolerance: Patient limited by lethargy Patient left: in bed;with call bell/phone within reach;with bed alarm set (bed into semi-chair position)   PT Visit Diagnosis: Unsteadiness on feet (R26.81);Other abnormalities of gait and mobility (R26.89);Muscle weakness (generalized) (M62.81)    Time: [id number] PT Time Calculation (min) (ACUTE ONLY): 22 min   Charges:   PT Evaluation $PT Eval Moderate Complexity: 1 Mod          Roney Marion, [place]  Acute Rehabilitation Services Pager [phone] Office [place] 03/03/2021, 1:39 PM

## 2021-03-03 NOTE — Progress Notes (Signed)
*  PRELIMINARY RESULTS* Echocardiogram 2D Echocardiogram has been performed.  [name] 03/03/2021, 2:06 PM

## 2021-03-04 DIAGNOSIS — R571 Hypovolemic shock: Secondary | ICD-10-CM | POA: Diagnosis not present

## 2021-03-04 LAB — CBC WITH DIFFERENTIAL/PLATELET
Abs Immature Granulocytes: 0.03 10*3/uL (ref 0.00–0.07)
Basophils Absolute: 0 10*3/uL (ref 0.0–0.1)
Basophils Relative: 0 %
Eosinophils Absolute: 0 10*3/uL (ref 0.0–0.5)
Eosinophils Relative: 0 %
HCT: 37.1 % (ref 36.0–46.0)
Hemoglobin: 12.2 g/dL (ref 12.0–15.0)
Immature Granulocytes: 0 %
Lymphocytes Relative: 16 %
Lymphs Abs: 1.1 10*3/uL (ref 0.7–4.0)
MCH: 27.5 pg (ref 26.0–34.0)
MCHC: 32.9 g/dL (ref 30.0–36.0)
MCV: 83.6 fL (ref 80.0–100.0)
Monocytes Absolute: 0.3 10*3/uL (ref 0.1–1.0)
Monocytes Relative: 5 %
Neutro Abs: 5.5 10*3/uL (ref 1.7–7.7)
Neutrophils Relative %: 79 %
Platelets: 143 10*3/uL — ABNORMAL LOW (ref 150–400)
RBC: 4.44 MIL/uL (ref 3.87–5.11)
RDW: 17.5 % — ABNORMAL HIGH (ref 11.5–15.5)
WBC: 7 10*3/uL (ref 4.0–10.5)
nRBC: 0 % (ref 0.0–0.2)

## 2021-03-04 LAB — CBC
HCT: 36.8 % (ref 36.0–46.0)
Hemoglobin: 12.1 g/dL (ref 12.0–15.0)
MCH: 27.5 pg (ref 26.0–34.0)
MCHC: 32.9 g/dL (ref 30.0–36.0)
MCV: 83.6 fL (ref 80.0–100.0)
Platelets: 139 10*3/uL — ABNORMAL LOW (ref 150–400)
RBC: 4.4 MIL/uL (ref 3.87–5.11)
RDW: 17.5 % — ABNORMAL HIGH (ref 11.5–15.5)
WBC: 7.1 10*3/uL (ref 4.0–10.5)
nRBC: 0 % (ref 0.0–0.2)

## 2021-03-04 LAB — T3: T3, Total: 49 ng/dL — ABNORMAL LOW (ref 71–180)

## 2021-03-04 LAB — BASIC METABOLIC PANEL
Anion gap: 10 (ref 5–15)
BUN: 22 mg/dL — ABNORMAL HIGH (ref 6–20)
CO2: 28 mmol/L (ref 22–32)
Calcium: 9.9 mg/dL (ref 8.9–10.3)
Chloride: 95 mmol/L — ABNORMAL LOW (ref 98–111)
Creatinine, Ser: 4.49 mg/dL — ABNORMAL HIGH (ref 0.44–1.00)
GFR, Estimated: 11 mL/min — ABNORMAL LOW (ref >60)
Glucose, Bld: 94 mg/dL (ref 70–99)
Potassium: 3.6 mmol/L (ref 3.5–5.1)
Sodium: 133 mmol/L — ABNORMAL LOW (ref 135–145)

## 2021-03-04 LAB — HAPTOGLOBIN: Haptoglobin: 224 mg/dL (ref 33–346)

## 2021-03-04 MED ORDER — MIDODRINE HCL 5 MG PO TABS
2.5000 mg | ORAL_TABLET | Freq: Two times a day (BID) | ORAL | Status: DC
  Filled 2021-03-04: qty 1

## 2021-03-04 MED ORDER — PROSOURCE PLUS PO LIQD
30.0000 mL | Freq: Two times a day (BID) | ORAL | Status: DC
  Filled 2021-03-04 (×2): qty 30

## 2021-03-04 NOTE — Progress Notes (Signed)
CSW attempted to meet with pt regarding DC planning.  Pt unable to participate.  CSW attempted to contact both daughters listed in epic, first contact has phone number not working, second contact no answer.  FL2 completed. Lurline Idol, MSW, LCSW 5/16/20223:28 PM

## 2021-03-04 NOTE — Progress Notes (Addendum)
PROGRESS NOTE                                                                                                                                                                                                             Patient Demographics:    [name], is a 60 y.o. female, DOB - 1960/12/10, [id number]  Outpatient Primary MD for the patient is Pcp, No    LOS - 4  Admit date - 02/28/2021    Chief Complaint  Patient presents with  . Bleeding/Bruising       Brief Narrative (HPI from H&P) - The patient is a 60 y.o. year-old w/ hx of PAD bilat BKA, smoker, hx CVA (2015), Ong (2013), seizures, PAF, chronic memory issues, HL, HTN, ESRD on HD, CAD who presented to ED from HD unit after R arm AVF infiltrated and pt's BP dropped with AMS at the unit, Hb in the ER returned at 7.4 which was down from 13 in Feb 2022, she admitted to ICU got 1 unit PRBC, seen by VVS, stabilized and transferred to [facility] on 03/02/21 with HB 6.9.   Subjective:   .  Patient in bed appears to be in no distress, she is not very verbal but will answer questions in yes or no denies any headache chest or abdominal pain.   Assessment  & Plan :     1. Acute metabolic encephalopathy at HD unit with hypotension and acute drop in hemoglobin - there was a question if her right arm AV fistula played, she also has history of underlying seizures and subdural hematoma.  Her mental status is close to her baseline right now and she denies any headache, HB is 6.9 with no clear source of ongoing acute bleeding, PPI, will check DIC panel, LDH, haptoglobin, Occ Stool,  and CT abdomen pelvis to rule out any retroperitoneal loss, check echocardiogram to ensure Valves are stable.  Will monitor stools.  For now she has received 1 unit of packed RBC in the ICU and will give her 2 more units with HD on 03/02/2021. PPI,  Monitor H&H closely.  2.  History of seizures, bilateral SDH,  VP shunt.  No headache, supple neck, continue antiseizure medication Keppra which he takes at home.  Monitor closely.  Question if she had a fluid imbalance induced hypotension causing seizure activity  at the dialysis unit causing her AMS.  Note her mental status appears to be close to her baseline according to nephrologist Dr. Jonnie Finner who has seen her several times before.  3.  Acute anemia on top of anemia of chronic disease.  Unclear cause question if lab error showed extremely low H&H, after 2 units of packed RBC on 03/02/2021 her hemoglobin has gone from 6 to 11, CT scan abdomen pelvis was without any retroperitoneal bleed, echocardiogram abdominal aorta without any major valvular abnormalities, no signs of obvious ongoing bleeding.  Will monitor  4.  Right arm AV fistula infiltration.  Seen by vascular surgery.  5.  Severe failure to thrive and deconditioning - PT OT, will require SNF.  6. ESRD.  On TTS schedule.  Renal following.  Getting dialyzed through right IJ dialysis catheter.  Right AV fistula has infiltrated she has been seen by PVS and fistula has been repaired and being given rest at this time.   7. [id number] metoprolol/amiodarone-not a candidate for anticoagulation given ICH/SAH/anemia  8. H/O RLE DVT-59ndeterminate, not a candidate for anticoagulation due to subdural bleeds, previous admissionsIR was consulted for IVC filter-but was not recommended.  9. Hyperthyroidism:On beta-blocker-apparently methimazole was discontinued last admit - stable TSH, needs outpatient endocrine follow-up post discharge.  10.  Anasarca with ascites and effusion due to severe protein calorie malnutrition 1 and extremely poor nutritional status - asymptomatic but will place on protein supplements.  11.  TTE with stable EF and wall motion abnormality.  No chest pain, poor candidate for invasive testing or procedures, continue beta-blocker and statin for secondary prevention.  No acute  issues.  Outpatient cardiology follow-up post discharge.      Condition - Extremely Guarded  Family Communication  :    Daughter [name] [phone] - number disconnected  Daughter [name] [phone] - 03/02/21 message left at 10:16 AM, message left on 03/04/21 at 9:15 AM  Sister [name] [phone] on 03/02/21 message left at 10:17 AM, message left again on 03/03/2021 at 10:51 AM, 03/04/2021 at 9:16 AM - message left.   Code Status :  Full  Consults  :  PCCM, Renal, VVS  PUD Prophylaxis : PPI   Procedures  :     TTE -  1. Left ventricular ejection fraction, by estimation, is 55 to 60%. The left ventricle has normal function. The left ventricle demonstrates regional wall motion abnormalities (see scoring diagram/findings for description). There is moderate asymmetric left ventricular hypertrophy of the septal segment. Left ventricular diastolic parameters are consistent with Grade II diastolic dysfunction (pseudonormalization).  2. Right ventricular systolic function is normal. The right ventricular size is normal. There is mildly elevated pulmonary artery systolic pressure. The estimated right ventricular systolic pressure is [id number] mmHg.  3. Left atrial size was moderately dilated.  4. Left pleural effusion present.  5. The mitral valve is grossly normal, mildly thickened. Mild mitral valve regurgitation.  6. The aortic valve is tricuspid. There is mild calcification of the aortic valve. Aortic valve regurgitation is not visualized. Mild aortic valve sclerosis is present, with no evidence of aortic valve stenosis.  7. The inferior vena cava is normal in size with <50% respiratory variability, suggesting right atrial pressure of 8 mmHg.  CT Bad pelvis - No RP bleed. - 1. No evidence of retroperitoneal hemorrhage. 2. Large volume simple fluid attenuation ascites throughout the abdomen and pelvis. Anasarca. 3. Moderate bilateral pleural effusions and associated atelectasis or consolidation. 4.  Cardiomegaly and coronary artery disease. 5.  Numerous low-attenuation lesions throughout the liver, incompletely characterized although most likely simple cysts. Aortic Atherosclerosis.  CT Head - Stable postoperative changes. Stable position of ventriculoperitoneal shunt with tip posterior to the third ventricle in the left midbrain region. Stable ventricular enlargement. Multiple prior infarcts, largest in the left parietal lobe region. No acute infarct evident. No mass, hemorrhage, or appreciable extra-axial fluid collections.      Disposition Plan  :    Status is: Inpatient  Remains inpatient appropriate because:IV treatments appropriate due to intensity of illness or inability to take PO   Dispo: The patient is from: Home              Anticipated d/c is to: Home              Patient currently is not medically stable to d/c.   Difficult to place patient No   DVT Prophylaxis  :    Place and maintain sequential compression device Start: 03/01/21 0910 SCDs Start: 02/28/21 1758   Lab Results  Component Value Date   PLT 139 (L) 03/04/2021   PLT 143 (L) 03/04/2021    Diet :  Diet Order            Diet renal with fluid restriction Fluid restriction: 2000 mL Fluid; Room service appropriate? Yes; Fluid consistency: Thin  Diet effective now                  Inpatient Medications  Scheduled Meds: . sodium chloride   Intravenous Once  . sodium chloride   Intravenous Once  . amiodarone  200 mg Oral Daily  . calcitRIOL  0.5 mcg Oral Q T,Th,Sa-HD  . calcium acetate  667 mg Oral TID WC  . Chlorhexidine Gluconate Cloth  6 each Topical Q0600  . Chlorhexidine Gluconate Cloth  6 each Topical Q0600  . feeding supplement (NEPRO CARB STEADY)  237 mL Oral TID BM  . levETIRAcetam  500 mg Oral BID  . metoprolol tartrate  25 mg Oral BID  . midodrine  10 mg Oral BID WC  . multivitamin  1 tablet Oral QHS  . pantoprazole  40 mg Oral Daily  . rosuvastatin  10 mg Oral Daily    Continuous Infusions: . sodium chloride     PRN Meds:.docusate sodium, heparin sodium (porcine), ondansetron (ZOFRAN) IV, polyethylene glycol  Antibiotics  :    Anti-infectives (From admission, onward)   Start     Dose/Rate Route Frequency Ordered Stop   03/02/21 1200  vancomycin (VANCOCIN) IVPB 500 mg/100 ml premix  Status:  Discontinued        500 mg 100 mL/hr over 60 Minutes Intravenous Every T-Th-Sa (Hemodialysis) 02/28/21 1747 03/01/21 0824   03/01/21 1800  ceFEPIme (MAXIPIME) 1 g in sodium chloride 0.9 % 100 mL IVPB  Status:  Discontinued        1 g 200 mL/hr over 30 Minutes Intravenous Every 24 hours 02/28/21 1747 03/01/21 0824   02/28/21 1800  vancomycin (VANCOREADY) IVPB 1000 mg/200 mL        1,000 mg 200 mL/hr over 60 Minutes Intravenous  Once 02/28/21 1716 02/28/21 1957   02/28/21 1730  ceFEPIme (MAXIPIME) 2 g in sodium chloride 0.9 % 100 mL IVPB        2 g 200 mL/hr over 30 Minutes Intravenous  Once 02/28/21 1716 02/28/21 1855   02/28/21 1730  metroNIDAZOLE (FLAGYL) IVPB 500 mg        500 mg 100 mL/hr over 60 Minutes  Intravenous  Once 02/28/21 1716 02/28/21 1938       Time Spent in minutes  30   Lala Lund M.D on 03/04/2021 at 9:14 AM  To page go to [email or web address]   [facility] -  Office  [phone]   See all Orders from today for further details    Objective:   Vitals:   03/03/21 1300 03/03/21 1938 03/04/21 0411 03/04/21 0419  BP: 140/62 (!) 149/59  140/60  Pulse: 63 66  63  Resp:  20  20  Temp: 98.9 F (37.2 C) 98.7 F (37.1 C)  98.7 F (37.1 C)  TempSrc: Oral Axillary  Axillary  SpO2: 98% 99%  99%  Weight:   47.4 kg   Height:        Wt Readings from Last 3 Encounters:  03/04/21 47.4 kg  11/30/20 47.2 kg  11/27/20 40.4 kg     Intake/Output Summary (Last 24 hours) at 03/04/2021 0914 Last data filed at 03/03/2021 1900 Gross per 24 hour  Intake 360 ml  Output --  Net 360 ml     Physical Exam  Awake, does not speak much,  moves all 4 extremities to commands, R. IJ HD Cath intact, right arm AV fistula site stable [place].AT,PERRAL Supple Neck,No JVD, No cervical lymphadenopathy appriciated.  Symmetrical Chest wall movement, Good air movement bilaterally, CTAB RRR,No Gallops, Rubs or new Murmurs, No Parasternal Heave +ve B.Sounds, Abd Soft, No tenderness, No organomegaly appriciated, No rebound - guarding or rigidity. No Cyanosis, Clubbing or edema, No new Rash or bruise     Data Review:    CBC Recent Labs  Lab 02/28/21 1406 03/01/21 0046 03/02/21 1907 03/03/21 0150 03/03/21 0756 03/03/21 1615 03/04/21 0016  WBC 4.9   < > 6.8 8.3 8.8 8.7 7.0  7.1  HGB 7.4*   < > 15.6* 12.2 11.6* 12.3 12.2  12.1  HCT 25.5*   < > 45.7 35.8* 33.8* 36.9 37.1  36.8  PLT 155   < > 100* 108* 116* 127* 143*  139*  MCV 87.3   < > 81.5 81.4 80.9 81.6 83.6  83.6  MCH 25.3*   < > 27.8 27.7 27.8 27.2 27.5  27.5  MCHC 29.0*   < > 34.1 34.1 34.3 33.3 32.9  32.9  RDW 19.3*   < > 17.3* 17.0* 17.2* 17.2* 17.5*  17.5*  LYMPHSABS 1.3  --   --  1.4  --   --  1.1  MONOABS 0.2  --   --  0.4  --   --  0.3  EOSABS 0.0  --   --  0.0  --   --  0.0  BASOSABS 0.0  --   --  0.0  --   --  0.0   < > = values in this interval not displayed.    Recent Labs  Lab 02/28/21 1406 02/28/21 1541 02/28/21 1606 03/01/21 0046 03/02/21 0833 03/02/21 0947 03/02/21 1010 03/03/21 0150 03/04/21 0016  NA 134*  --   --  135 134*  --   --  133* 133*  K 3.4*  --   --  3.7 3.6  --   --  3.5 3.6  CL 94*  --   --  96* 96*  --   --  96* 95*  CO2 26  --   --  27 29  --   --  29 28  GLUCOSE 121*  --   --  93 87  --   --  88 94  BUN 15  --   --  17 22*  --   --  15 22*  CREATININE 4.37*  --   --  4.12* 4.79*  --   --  3.56* 4.49*  CALCIUM 9.0  --   --  9.2 9.2  --   --  9.3 9.9  AST 18  --   --   --   --   --   --   --   --   ALT 8  --   --   --   --   --   --   --   --   ALKPHOS 54  --   --   --   --   --   --   --   --   BILITOT 0.7  --   --   --    --   --   --   --   --   ALBUMIN 1.5*  --   --   --  1.3*  --   --   --   --   MG  --   --   --  2.0  --   --   --   --   --   DDIMER  --   --   --   --   --  14.76*  --   --   --   LATICACIDVEN 8.1*  --  8.7*  --   --   --   --   --   --   INR  --  1.3*  --   --   --  1.1  --   --   --   TSH  --   --   --   --   --   --  2.244  --   --     ------------------------------------------------------------------------------------------------------------------ No results for input(s): CHOL, HDL, LDLCALC, TRIG, CHOLHDL, LDLDIRECT in the last 72 hours.  Lab Results  Component Value Date   HGBA1C 5.2 10/07/2020   ------------------------------------------------------------------------------------------------------------------ Recent Labs    03/02/21 1010  TSH 2.244    Cardiac Enzymes No results for input(s): CKMB, TROPONINI, MYOGLOBIN in the last 168 hours.  Invalid input(s): CK ------------------------------------------------------------------------------------------------------------------    Component Value Date/Time   BNP >4,500.0 (H) 11/13/2020 1027    Micro Results Recent Results (from the past 240 hour(s))  Resp Panel by RT-PCR (Flu A&B, Covid) Nasopharyngeal Swab     Status: None   Collection Time: 02/28/21  5:26 PM   Specimen: Nasopharyngeal Swab; Nasopharyngeal(NP) swabs in vial transport medium  Result Value Ref Range Status   SARS Coronavirus 2 by RT PCR NEGATIVE NEGATIVE Final    Comment: (NOTE) SARS-CoV-2 target nucleic acids are NOT DETECTED.  The SARS-CoV-2 RNA is generally detectable in upper respiratory specimens during the acute phase of infection. The lowest concentration of SARS-CoV-2 viral copies this assay can detect is 138 copies/mL. A negative result does not preclude SARS-Cov-2 infection and should not be used as the sole basis for treatment or other patient management decisions. A negative result may occur with  improper specimen collection/handling,  submission of specimen other than nasopharyngeal swab, presence of viral mutation(s) within the areas targeted by this assay, and inadequate number of viral copies(<138 copies/mL). A negative result must be combined with clinical observations, patient history, and epidemiological information. The expected result is Negative.  Fact Sheet for Patients:  EntrepreneurPulse.com.au  Fact Sheet for  Healthcare Providers:  IncredibleEmployment.be  This test is no t yet approved or cleared by the [place] and  has been authorized for detection and/or diagnosis of SARS-CoV-2 by FDA under an Emergency Use Authorization (EUA). This EUA will remain  in effect (meaning this test can be used) for the duration of the COVID-19 declaration under Section 564(b)(1) of the Act, 21 U.S.C.section 360bbb-3(b)(1), unless the authorization is terminated  or revoked sooner.       Influenza A by PCR NEGATIVE NEGATIVE Final   Influenza B by PCR NEGATIVE NEGATIVE Final    Comment: (NOTE) The Xpert Xpress SARS-CoV-2/FLU/RSV plus assay is intended as an aid in the diagnosis of influenza from Nasopharyngeal swab specimens and should not be used as a sole basis for treatment. Nasal washings and aspirates are unacceptable for Xpert Xpress SARS-CoV-2/FLU/RSV testing.  Fact Sheet for Patients: EntrepreneurPulse.com.au  Fact Sheet for Healthcare Providers: IncredibleEmployment.be  This test is not yet approved or cleared by the [place] FDA and has been authorized for detection and/or diagnosis of SARS-CoV-2 by FDA under an Emergency Use Authorization (EUA). This EUA will remain in effect (meaning this test can be used) for the duration of the COVID-19 declaration under Section 564(b)(1) of the Act, 21 U.S.C. section 360bbb-3(b)(1), unless the authorization is terminated or revoked.  Performed at [facility], [place] [address], [place], [place] [address]   MRSA PCR Screening     Status: None   Collection Time: 02/28/21  7:40 PM   Specimen: Nasopharyngeal  Result Value Ref Range Status   MRSA by PCR NEGATIVE NEGATIVE Final    Comment:        The GeneXpert MRSA Assay (FDA approved for NASAL specimens only), is one component of a comprehensive MRSA colonization surveillance program. It is not intended to diagnose MRSA infection nor to guide or monitor treatment for MRSA infections. Performed at [facility], [place] [address] [place], [place] [address]   Culture, blood (single)     Status: None (Preliminary result)   Collection Time: 03/01/21 12:46 AM   Specimen: BLOOD LEFT HAND  Result Value Ref Range Status   Specimen Description BLOOD LEFT HAND  Final   Special Requests   Final    BOTTLES DRAWN AEROBIC AND ANAEROBIC Blood Culture results may not be optimal due to an inadequate volume of blood received in culture bottles   Culture   Final    NO GROWTH 2 DAYS Performed at [facility], [place] [address], [place], [place] [address]    Report Status PENDING  Incomplete    Radiology Reports CT ABDOMEN PELVIS WO CONTRAST  Result Date: 03/02/2021 CLINICAL DATA:  Anemia, evaluate for retroperitoneal bleed EXAM: CT ABDOMEN AND PELVIS WITHOUT CONTRAST TECHNIQUE: Multidetector CT imaging of the abdomen and pelvis was performed following the standard protocol without IV contrast. COMPARISON:  11/09/2020 FINDINGS: Lower chest: Moderate bilateral pleural effusions and associated atelectasis or consolidation. Cardiomegaly. Extensive 3 vessel coronary artery calcifications. Hepatobiliary: No solid liver abnormality is seen. Numerous low-attenuation lesions throughout the liver, incompletely characterized although most likely simple cysts. No gallstones, gallbladder wall thickening, or biliary dilatation. Pancreas: Unremarkable. No pancreatic ductal dilatation or surrounding inflammatory changes. Spleen:  Normal in size without significant abnormality. Adrenals/Urinary Tract: Adrenal glands are unremarkable. Atrophic appearance of the kidneys. Multiple bilateral small calculi and/or vascular calcifications. No hydronephrosis. Bladder is unremarkable. Stomach/Bowel: Stomach is within normal limits. Appendix appears normal. No evidence of bowel wall thickening,  distention, or inflammatory changes. Vascular/Lymphatic: Severe, pipe like aortic atherosclerosis. No enlarged abdominal or pelvic lymph nodes. Reproductive: Status post hysterectomy. Other: No abdominal wall hernia or abnormality. Anasarca. Large volume ascites throughout the abdomen and pelvis. Shunt catheter tubing gently looped around the abdomen, tip position in the posterior right hemipelvis (series 3, image 82). Musculoskeletal: No acute or significant osseous findings. IMPRESSION: 1. No evidence of retroperitoneal hemorrhage. 2. Large volume simple fluid attenuation ascites throughout the abdomen and pelvis. Anasarca. 3. Moderate bilateral pleural effusions and associated atelectasis or consolidation. 4. Cardiomegaly and coronary artery disease. 5. Numerous low-attenuation lesions throughout the liver, incompletely characterized although most likely simple cysts. Aortic Atherosclerosis (ICD10-I70.0). Electronically Signed   By: Eddie Candle M.D.   On: 03/02/2021 16:10   DG Chest 1 View  Result Date: 02/28/2021 CLINICAL DATA:  The patient became unresponsive during dialysis today. EXAM: CHEST  1 VIEW COMPARISON:  11/28/2020 FINDINGS: Stable right jugular double-lumen catheter, ventriculoperitoneal shunt tubing and enlarged cardiac silhouette. The aorta remains tortuous and partially calcified. No significant change in a moderate-sized right pleural effusion. Decreased associated right basilar atelectasis. Clear left lung. Diffuse osteopenia. IMPRESSION: 1. No acute abnormality. 2. Stable moderate-sized right pleural effusion. 3. Stable cardiomegaly  and decreased right basilar atelectasis. Electronically Signed   By: Claudie Revering M.D.   On: 02/28/2021 14:43   CT Head Wo Contrast  Result Date: 02/28/2021 CLINICAL DATA:  Altered mental status EXAM: CT HEAD WITHOUT CONTRAST TECHNIQUE: Contiguous axial images were obtained from the base of the skull through the vertex without intravenous contrast. COMPARISON:  November 28, 2020, November 19, 2020 common November 17, 2020 FINDINGS: Brain: There is a ventriculostomy catheter inserted from the right frontal region with the tip in the left mid brain slightly posterior to the third ventricle, unchanged. There remains moderate generalized ventricular prominence. There is a degree of underlying sulcal atrophy, stable. There is no intracranial mass or hemorrhage. Previous small extra-axial fluid collections are no longer evident. No subdural or epidural fluid collections are appreciable currently. There is no midline shift. There is evidence of encephalomalacia in the left parietal lobe consistent with prior infarct, stable. There is small vessel disease throughout the centra semiovale bilaterally, stable. There are posterior cerebellar infarcts bilaterally, stable. There is evidence of a prior infarct involving the anterior limbs of the left external and extreme capsules with infarct in the anterior left claustrum. No acute appearing infarct is evident. Vascular: Apparent apparent coil at basilar tip level is stable. There is a clip in the right middle cerebral artery distribution. There is calcification in each carotid siphon region. There is also calcification in each distal vertebral artery. No hyperdense vessel evident. Skull: Evidence of previous craniotomy on the right in the mid to posterior frontal region. No new bone lesions. Sinuses/Orbits: Paranasal sinuses clear. Orbits appear symmetric bilaterally. Other: Mastoid air cells clear. IMPRESSION: Stable postoperative changes. Stable position of  ventriculoperitoneal shunt with tip posterior to the third ventricle in the left midbrain region. Stable ventricular enlargement. Multiple prior infarcts, largest in the left parietal lobe region. No acute infarct evident. No mass, hemorrhage, or appreciable extra-axial fluid collections. Multiple foci of arterial vascular calcification evident. Electronically Signed   By: Lowella Grip III M.D.   On: 02/28/2021 16:06   ECHOCARDIOGRAM COMPLETE  Result Date: 03/03/2021    ECHOCARDIOGRAM REPORT   Patient Name:   REXIE BARMAN Date of Exam: 03/03/2021 Medical Rec #:  [id number]    Height:  61.0 in Accession #:    [id number]   Weight:       104.9 lb Date of Birth:  1961-08-26    BSA:          1.436 m Patient Age:    20 years     BP:           140/58 mmHg Patient Gender: F            HR:           64 bpm. Exam Location:  Inpatient Procedure: 2D Echo, Cardiac Doppler and Color Doppler Indications:    CHF-Acute Diastolic [id number] / [id number]  History:        Patient has prior history of Echocardiogram examinations, most                 recent 10/07/2020. CHF, CAD, Signs/Symptoms:Syncope and Dyspnea;                 Risk Factors:Hypertension, Dyslipidemia and Current Smoker.                 Altered Mental Status, End-stage renal disease on hemodialysis                 ([place]).  Sonographer:    Alvino Chapel RCS Referring Phys: [id number] Margaree Mackintosh [facility]  Sonographer Comments: Patient could NOT sniff. IMPRESSIONS  1. Left ventricular ejection fraction, by estimation, is 55 to 60%. The left ventricle has normal function. The left ventricle demonstrates regional wall motion abnormalities (see scoring diagram/findings for description). There is moderate asymmetric left ventricular hypertrophy of the septal segment. Left ventricular diastolic parameters are consistent with Grade II diastolic dysfunction (pseudonormalization).  2. Right ventricular systolic function is normal. The right ventricular size is normal. There is mildly  elevated pulmonary artery systolic pressure. The estimated right ventricular systolic pressure is [id number] mmHg.  3. Left atrial size was moderately dilated.  4. Left pleural effusion present.  5. The mitral valve is grossly normal, mildly thickened. Mild mitral valve regurgitation.  6. The aortic valve is tricuspid. There is mild calcification of the aortic valve. Aortic valve regurgitation is not visualized. Mild aortic valve sclerosis is present, with no evidence of aortic valve stenosis.  7. The inferior vena cava is normal in size with <50% respiratory variability, suggesting right atrial pressure of 8 mmHg. FINDINGS  Left Ventricle: Left ventricular ejection fraction, by estimation, is 55 to 60%. The left ventricle has normal function. The left ventricle demonstrates regional wall motion abnormalities. The left ventricular internal cavity size was normal in size. There is moderate asymmetric left ventricular hypertrophy of the septal segment. Left ventricular diastolic parameters are consistent with Grade II diastolic dysfunction (pseudonormalization).  LV Wall Scoring: The basal inferior segment is hypokinetic. The entire anterior wall, entire lateral wall, entire septum, entire apex, and mid and distal inferior wall are normal. Right Ventricle: The right ventricular size is normal. No increase in right ventricular wall thickness. Right ventricular systolic function is normal. There is mildly elevated pulmonary artery systolic pressure. The tricuspid regurgitant velocity is 2.78  m/s, and with an assumed right atrial pressure of 8 mmHg, the estimated right ventricular systolic pressure is [id number]. Left Atrium: Left atrial size was moderately dilated. Right Atrium: Right atrial size was normal in size. Pericardium: Left pleural effusion present. There is no evidence of pericardial effusion. Mitral Valve: The mitral valve is grossly normal. There is mild thickening of the mitral valve leaflet(s). Mild mitral  valve regurgitation. Tricuspid Valve: The tricuspid valve is grossly normal. Tricuspid valve regurgitation is mild. Aortic Valve: The aortic valve is tricuspid. There is mild calcification of the aortic valve. There is mild aortic valve annular calcification. Aortic valve regurgitation is not visualized. Mild aortic valve sclerosis is present, with no evidence of aortic valve stenosis. Pulmonic Valve: The pulmonic valve was grossly normal. Pulmonic valve regurgitation is trivial. Aorta: The aortic root is normal in size and structure. Venous: The inferior vena cava is normal in size with less than 50% respiratory variability, suggesting right atrial pressure of 8 mmHg. IAS/Shunts: No atrial level shunt detected by color flow Doppler.  LEFT VENTRICLE PLAX 2D LVIDd:         4.00 cm  Diastology LVIDs:         2.80 cm  LV e' medial:    4.90 cm/s LV PW:         1.10 cm  LV E/e' medial:  22.9 LV IVS:        1.40 cm  LV e' lateral:   9.46 cm/s LVOT diam:     1.70 cm  LV E/e' lateral: 11.8 LV SV:         50 LV SV Index:   35 LVOT Area:     2.27 cm  RIGHT VENTRICLE RV S prime:     8.81 cm/s TAPSE (M-mode): 1.5 cm LEFT ATRIUM             Index       RIGHT ATRIUM           Index LA diam:        3.80 cm 2.65 cm/m  RA Area:     15.50 cm LA Vol (A2C):   55.2 ml 38.44 ml/m RA Volume:   36.10 ml  25.14 ml/m LA Vol (A4C):   62.1 ml 43.25 ml/m LA Biplane Vol: 61.6 ml 42.90 ml/m  AORTIC VALVE LVOT Vmax:   102.00 cm/s LVOT Vmean:  57.400 cm/s LVOT VTI:    0.221 m  AORTA Ao Root diam: 2.90 cm MITRAL VALVE                TRICUSPID VALVE MV Area (PHT): 4.80 cm     TR Peak grad:   30.9 mmHg MV Decel Time: 158 msec     TR Vmax:        278.00 cm/s MV E velocity: 112.00 cm/s MV A velocity: 94.70 cm/s   SHUNTS MV E/A ratio:  1.18         Systemic VTI:  0.22 m                             Systemic Diam: 1.70 cm Rozann Lesches MD Electronically signed by Rozann Lesches MD Signature Date/Time: 03/03/2021/2:24:36 PM    Final

## 2021-03-04 NOTE — NC FL2 (Addendum)
MEDICAID FL2 LEVEL OF CARE SCREENING TOOL     IDENTIFICATION  Patient Name: [name] Birthdate: [birth year] Sex: female Admission Date (Current Location): 02/28/2021  [place] and [place] Number:  [name] [id number] [place] and Address:  [facility], [place] [address], [place], [place] [address]      Provider Number: [id number]  Attending Physician Name and Address:  Thurnell Lose, MD  Relative Name and Phone Number:  Warrick Parisian Daughter [phone]    Current Level of Care: Hospital Recommended Level of Care: [place] Prior Approval Number:    Date Approved/Denied:   PASRR Number: [id number]  Discharge Plan: SNF    Current Diagnoses: Patient Active Problem List   Diagnosis Date Noted  . Hemorrhagic shock ([place]) 02/28/2021  . Pressure injury of skin 11/29/2020  . Pleural effusion 11/28/2020  . Subdural fluid collection 11/27/2020  . Left wrist fracture 11/27/2020  . Leg DVT (deep venous thromboembolism), acute, bilateral ([place]) 11/27/2020  . Hyperthyroidism 11/27/2020  . Malnutrition of moderate degree 11/20/2020  . Epigastric abdominal pain 11/08/2020  . NSTEMI (non-ST elevated myocardial infarction) ([place]) 10/16/2020  . AMS (altered mental status) 10/06/2020  . Elevated troponin 10/06/2020  . Headache, unspecified 11/28/2019  . Hypercalcemia 11/10/2019  . Allergy, unspecified, initial encounter 08/08/2019  . Hypokalemia 07/07/2019  . Sepsis, unspecified organism ([place]) 02/03/2019  . End-stage renal disease on hemodialysis ([place]) 02/03/2019  . Unspecified atrial fibrillation ([place]) 02/03/2019  . Hypotension 02/02/2019  . Syncope and collapse   . Goals of care, counseling/discussion   . Atrial fibrillation with rapid ventricular response ([place]) 03/20/2018  . Chills (without fever) 03/16/2018  . Iron deficiency anemia, unspecified 02/04/2018  . 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 Anemia in  chronic kidney disease 12/30/2017  . Fever, unspecified 12/30/2017  . Generalized abdominal pain 12/30/2017  . Heart failure, unspecified ([place]) 12/30/2017  . Other specified personal risk factors, not elsewhere classified 12/30/2017  . Pure hypercholesterolemia, unspecified 12/30/2017  . Secondary hyperparathyroidism of renal origin ([place]) 12/30/2017  . Shortness of breath 12/30/2017  . History of embolic stroke [id number]  . Altered mental status 11/08/2013  . HTN (hypertension) 11/08/2013  . Hypertensive renal disease 11/06/2013  . Protein-calorie malnutrition, severe ([place]) 11/04/2013  . Seizure disorder ([place]) 10/22/2013  . Hypertensive urgency 10/20/2013  . Hypertensive heart disease 05/07/2013  . Acute-on-chronic renal failure ([place]) 05/07/2013  . HTN (hypertension), malignant 05/06/2013  . CAD (coronary artery disease) 05/06/2013  . ESRD (end stage renal disease) ([place]) 05/06/2013  . Chronic diastolic heart failure ([place])   . Tobacco abuse 09/25/2012  . H/O noncompliance with medical treatment, presenting hazards to health   . Renal artery stenosis ([place]) 04/05/2012  . History of subarachnoid hemorrhage 03/03/2012  . Mixed hyperlipidemia 09/19/2008  . Acute on chronic diastolic heart failure ([place]) 09/19/2008    Orientation RESPIRATION BLADDER Height & Weight     Self  Normal Continent Weight: 104 lb 8 oz (47.4 kg) Height:  '5\' 1"'$  (154.9 cm)  BEHAVIORAL SYMPTOMS/MOOD NEUROLOGICAL BOWEL NUTRITION STATUS      Continent Diet (see discharge summary)  AMBULATORY STATUS COMMUNICATION OF NEEDS Skin   Total Care Non-Verbally Surgical wounds                       Personal Care Assistance Level of Assistance  Bathing,Feeding,Total care,Dressing Bathing Assistance: Maximum assistance Feeding assistance: Maximum assistance Dressing Assistance: Maximum assistance Total Care Assistance: Maximum assistance   Functional Limitations Info  Sight,Hearing,Speech Sight Info:  Adequate Hearing Info: Adequate Speech Info: Impaired    SPECIAL CARE FACTORS FREQUENCY  PT (By licensed PT),OT (By licensed OT)     PT Frequency: 5x week OT Frequency: 5x week            Contractures Contractures Info: Not present    Additional Factors Info  Code Status,Allergies Code Status Info: full Allergies Info: Amoxicillin, Penicillins           Current Medications (03/04/2021):  This is the current hospital active medication list Current Facility-Administered Medications  Medication Dose Route Frequency Provider Last Rate Last Admin  . (feeding supplement) PROSource Plus liquid 30 mL  30 mL Oral BID BM Thurnell Lose, MD      . 0.9 %  sodium chloride infusion (Manually program via Guardrails IV Fluids)   Intravenous Once Aris Lot, MD      . 0.9 %  sodium chloride infusion (Manually program via Guardrails IV Fluids)   Intravenous Once Thurnell Lose, MD      . 0.9 %  sodium chloride infusion  250 mL Intravenous Continuous Suzan Nailer, DO      . amiodarone (PACERONE) tablet 200 mg  200 mg Oral Daily Gleason, Otilio Carpen, PA-C   200 mg at 03/04/21 1056  . calcitRIOL (ROCALTROL) capsule 0.5 mcg  0.5 mcg Oral Q T,Th,Sa-HD Gleason, Otilio Carpen, PA-C   0.5 mcg at 03/02/21 1711  . calcium acetate (PHOSLO) capsule 667 mg  667 mg Oral TID WC Gleason, Otilio Carpen, PA-C   667 mg at 03/04/21 1055  . Chlorhexidine Gluconate Cloth 2 % PADS 6 each  6 each Topical Q0600 Kipp Brood, MD   6 each at 03/03/21 0548  . Chlorhexidine Gluconate Cloth 2 % PADS 6 each  6 each Topical Q0600 Roney Jaffe, MD   6 each at 03/03/21 0548  . docusate sodium (COLACE) capsule 100 mg  100 mg Oral BID PRN Merlene Laughter F, NP      . feeding supplement (NEPRO CARB STEADY) liquid 237 mL  237 mL Oral TID BM Gleason, Otilio Carpen, PA-C   237 mL at 03/03/21 1000  . heparin sodium (porcine) injection 1,000 Units  1,000 Units Intravenous Q dialysis Roney Jaffe, MD   3,200 Units at 03/02/21 1106   . levETIRAcetam (KEPPRA) tablet 500 mg  500 mg Oral BID Gleason, Otilio Carpen, PA-C   500 mg at 03/04/21 1057  . metoprolol tartrate (LOPRESSOR) tablet 25 mg  25 mg Oral BID Gleason, Otilio Carpen, PA-C   25 mg at 03/04/21 1056  . multivitamin (RENA-VIT) tablet 1 tablet  1 tablet Oral QHS Gleason, Otilio Carpen, PA-C   1 tablet at 03/03/21 2110  . ondansetron (ZOFRAN) injection 4 mg  4 mg Intravenous Q6H PRN Merlene Laughter F, NP      . pantoprazole (PROTONIX) EC tablet 40 mg  40 mg Oral Daily Gleason, Otilio Carpen, PA-C   40 mg at 03/04/21 1056  . polyethylene glycol (MIRALAX / GLYCOLAX) packet 17 g  17 g Oral Daily PRN Merlene Laughter F, NP      . rosuvastatin (CRESTOR) tablet 10 mg  10 mg Oral Daily Gleason, Otilio Carpen, PA-C   10 mg at 03/04/21 1055     Discharge Medications: Please see discharge summary for a list of discharge medications.  Relevant Imaging Results:  Relevant Lab Results:   Additional Information SS#: [id number]. Dialysis outpatient. Please add outpatient palliative care at SNF.   Wierda,  Veronia Beets, LCSW

## 2021-03-04 NOTE — Evaluation (Addendum)
Clinical/Bedside Swallow Evaluation Patient Details  Name: [name] MRN: [id number] Date of Birth: [birth year]  Today's Date: 03/04/2021 Time: SLP Start Time (ACUTE ONLY): 1629 SLP Stop Time (ACUTE ONLY): 1644 SLP Time Calculation (min) (ACUTE ONLY): 15.87 min  Past Medical History:  Past Medical History:  Diagnosis Date  . Anterior cerebral artery aneurysm    1996 AT [facility]  . CAD (coronary artery disease)    a. 2009 for NSTEMI with 70% dLAD, 30% mCx, 50% dCx, 95% dRCA, 40% PLA -> RCA was treated with DES.  . Diastolic heart failure    LVEF 60-65% with grade 2 diastolic dysfunction - July 2014  . Encephalopathy   . ESRD (end stage renal disease) on dialysis ([place])   . HTN (hypertension)    Resistant  . Hypercholesterolemia   . PAF (paroxysmal atrial fibrillation) ([place]) 02/03/2019  . Poor historian   . Seizures ([place])   . Stroke (cerebrum) ([place]) 2015  . Subarachnoid hemorrhage ([place])    03/2012  . Tobacco abuse    Resumed smoking half a pack a day. She was a smoker in the past and states she was abl eto stop in the past using nicotine patches   Past Surgical History:  Past Surgical History:  Procedure Laterality Date  . ABDOMINAL HYSTERECTOMY    . BASCILIC VEIN TRANSPOSITION Right 05/13/2013   Procedure: [place];  Surgeon: Rosetta Posner, MD;  Location: [place];  Service: Vascular;  Laterality: Right;  . BRAIN SURGERY     2   . INSERTION OF DIALYSIS CATHETER Right 10/15/2020   Procedure: INSERTION OF TUNNELED DIALYSIS CATHETER;  Surgeon: Cherre Robins, MD;  Location: [facility] OR;  Service: Vascular;  Laterality: Right;  . IR THORACENTESIS ASP PLEURAL SPACE W/IMG GUIDE  11/26/2020  . LOOP RECORDER IMPLANT N/A 11/14/2013   Procedure: LOOP RECORDER IMPLANT;  Surgeon: Evans Lance, MD;  Location[facility] C[facility] LAB;  Service: Cardiovascular;  Laterality: N/A;  . OPEN REDUCTION INTERNAL FIXATION (ORIF) DISTAL RADIAL FRACTURE Left 11/15/2020   Procedure: OPEN REDUCTION INTERNAL  FIXATION (ORIF) DISTAL RADIAL FRACTURE;  Surgeon: Verner Mould, MD;  Location: [place];  Service: Orthopedics;  Laterality: Left;  . REVISON OF ARTERIOVENOUS FISTULA Right 10/15/2020   Procedure: REVISON OF ARTERIOVENOUS FISTULA ARM;  Surgeon: Cherre Robins, MD;  Location: [place];  Service: Vascular;  Laterality: Right;  . TEE WITHOUT CARDIOVERSION N/A 11/14/2013   Procedure: TRANSESOPHAGEAL ECHOCARDIOGRAM (TEE);  Surgeon: Candee Furbish, MD;  Location[facility] ENDOSCOPY;  Service: Cardiovascular;  Laterality: N/A;  . VENTRICULOPERITONEAL SHUNT  03/27/2012   Procedure: SHUNT INSERTION VENTRICULAR-PERITONEAL;  Surgeon: Winfield Cunas, MD;  Location: [place] NEURO ORS;  Service: Neurosurgery;  Laterality: N/A;  Ventricular-Peritoneal Shunt Insertion   HPI:  Pt is a 60 year old female who presented to the ED secondary to AMS, hypotension, bleed from AV fistula. Pt began dialysis and 30 seconds after cannulation, site was noted to be infiltrating into the R chest and R arm. Pt had questionable seizure activity (per HD center staff) and became unresponsive. Dx Acute metabolic encephalopathy. PMHx significant for HTN, diastolic HF, CAD, PAF, CVA/SAH s/p VP shunt and ESRD. BSE 11/11/20 was WNL; regular texture diet and thin liquids recommended without need for f/u.   Assessment / Plan / Recommendation Clinical Impression  Pt was seen for bedside swallow evaluation. Pt exhibited difficulty following commands and did not communicate verbally, but she responded to yes/no questions with gestures. Oral mucosa was moist and  dentition adequate. Mastication was WNL for regular texture solids. Mild lingual residue was noted and cleared with secondary swallows and/or liquid washes. No s/sx of aspiration were demonstrated even when challenged with large boluses. Pt's TV was loud upon SLP's entry and it was muted during the evaluation. However, oral holding began towards the end of the evaluation when pt was noted to be focused  on and attempting to watch SpongeBob SquarePants on the muted TV. The TV was turned off and pt was again able to attend to p.o. intake and swallow boluses without oral/pharyngeal deficits. SLP anticipates that pt's impaired cognition and deficits in attention will negatively impact swallow function and may result in behaviors such as the spitting of food noted by staff today. It is recommended that the pt's current diet be continued with observance of swallowing precautions and elimination of distractions. SLP will follow briefly to ensure diet tolerance. SLP Visit Diagnosis: Dysphagia, unspecified (R13.10)    Aspiration Risk  Mild aspiration risk    Diet Recommendation Regular;Thin liquid   Liquid Administration via: Cup;Straw Medication Administration: Whole meds with puree Supervision: Full supervision/cueing for compensatory strategies Compensations: Minimize environmental distractions Postural Changes: Seated upright at 90 degrees    Other  Recommendations Oral Care Recommendations: Oral care BID   Follow up Recommendations None      Frequency and Duration min 2x/week  1 week       Prognosis Prognosis for Safe Diet Advancement: Good Barriers to Reach Goals: Cognitive deficits      Swallow Study   General Date of Onset: 03/03/21 HPI: Pt is a 60 year old female who presented to the ED secondary to AMS, hypotension, bleed from AV fistula. Pt began dialysis and 30 seconds after cannulation, site was noted to be infiltrating into the R chest and R arm. Pt had questionable seizure activity (per HD center staff) and became unresponsive. Dx Acute metabolic encephalopathy. PMHx significant for HTN, diastolic HF, CAD, PAF, CVA/SAH s/p VP shunt and ESRD. BSE 11/11/20 was WNL; regular texture diet and thin liquids recommended without need for f/u. Type of Study: Bedside Swallow Evaluation Previous Swallow Assessment: See HPI Diet Prior to this Study: Regular;Thin liquids Temperature Spikes  Noted: No Respiratory Status: Room air History of Recent Intubation: No Behavior/Cognition: Alert;Cooperative;Pleasant mood Oral Cavity Assessment: Within Functional Limits Oral Care Completed by SLP: No Oral Cavity - Dentition: Adequate natural dentition Vision: Functional for self-feeding Self-Feeding Abilities: Total assist Patient Positioning: Upright in bed;Postural control adequate for testing Baseline Vocal Quality: Not observed Volitional Cough: Cognitively unable to elicit Volitional Swallow: Able to elicit    Oral/Motor/Sensory Function Overall Oral Motor/Sensory Function:  (UTA)   Ice Chips Ice chips: Within functional limits Presentation: Spoon   Thin Liquid Thin Liquid: Within functional limits Presentation: Straw;Cup    Nectar Thick Nectar Thick Liquid: Not tested   Honey Thick Honey Thick Liquid: Not tested   Puree Puree: Within functional limits Presentation: Spoon   Solid     Solid: Within functional limits     Keyleen Cerrato I. Hardin Negus, [place], [place] Office number [phone] Pager [phone]  Horton Marshall 03/04/2021,5:41 PM

## 2021-03-04 NOTE — Progress Notes (Signed)
Found patient with chewed up food (that she spit out) all over herself and bed. Patient appears she is unable to feed herself.  Dr. Candiss Norse has consulted Speech Therapy.

## 2021-03-04 NOTE — Progress Notes (Signed)
[facility] Progress Note   Subjective:   Seen in the room. No c/o's.    Objective Vitals:   03/03/21 1938 03/04/21 0411 03/04/21 0419 03/04/21 1620  BP: (!) 149/59  140/60   Pulse: 66  63   Resp: 20  20   Temp: 98.7 F (37.1 C)  98.7 F (37.1 C)   TempSrc: Axillary  Axillary   SpO2: 99%  99%   Weight:  47.4 kg  47.8 kg  Height:    5' 0.98" (1.549 m)   Physical Exam General: Frail appearing; no acute respiratory distress Heart: Normal S1 and S2; No murmurs, gallops, or friction rub Lungs: Respirations unlabored; Lungs clear anteriorly; No wheezing, rales, or rhonchi Abdomen: +2 rounded ascites,non-tender, active bowel sounds Extremities: Noted edema RUE and L hip; no pretib edema Dialysis Access: R AVF (+) Bruit/Thrill; R IJ TDC in use.  Filed Weights   03/03/21 0610 03/04/21 0411 03/04/21 1620  Weight: 47.6 kg 47.4 kg 47.8 kg    Intake/Output Summary (Last 24 hours) at 03/04/2021 1649 Last data filed at 03/03/2021 1900 Gross per 24 hour  Intake 120 ml  Output --  Net 120 ml    Additional Objective Labs: Basic Metabolic Panel: Recent Labs  Lab 03/01/21 0046 03/02/21 0833 03/03/21 0150 03/04/21 0016  NA 135 134* 133* 133*  K 3.7 3.6 3.5 3.6  CL 96* 96* 96* 95*  CO2 '27 29 29 28  '$ GLUCOSE 93 87 88 94  BUN 17 22* 15 22*  CREATININE 4.12* 4.79* 3.56* 4.49*  CALCIUM 9.2 9.2 9.3 9.9  PHOS 5.4* 5.3*  --   --    Liver Function Tests: Recent Labs  Lab 02/28/21 1406 03/02/21 0833  AST 18  --   ALT 8  --   ALKPHOS 54  --   BILITOT 0.7  --   PROT 4.7*  --   ALBUMIN 1.5* 1.3*   No results for input(s): LIPASE, AMYLASE in the last 168 hours. CBC: Recent Labs  Lab 02/28/21 1406 03/01/21 0046 03/02/21 1907 03/03/21 0150 03/03/21 0756 03/03/21 1615 03/04/21 0016  WBC 4.9   < > 6.8 8.3 8.8 8.7 7.0  7.1  NEUTROABS 3.3  --   --  6.5  --   --  5.5  HGB 7.4*   < > 15.6* 12.2 11.6* 12.3 12.2  12.1  HCT 25.5*   < > 45.7 35.8* 33.8* 36.9 37.1   36.8  MCV 87.3   < > 81.5 81.4 80.9 81.6 83.6  83.6  PLT 155   < > 100* 108* 116* 127* 143*  139*   < > = values in this interval not displayed.   Blood Culture    Component Value Date/Time   SDES BLOOD LEFT HAND 03/01/2021 0046   SPECREQUEST  03/01/2021 0046    BOTTLES DRAWN AEROBIC AND ANAEROBIC Blood Culture results may not be optimal due to an inadequate volume of blood received in culture bottles   CULT  03/01/2021 0046    NO GROWTH 3 DAYS Performed at [facility], [place] [address], [place], [place] [address]    REPTSTATUS PENDING 03/01/2021 0046    Cardiac Enzymes: No results for input(s): CKTOTAL, CKMB, CKMBINDEX, TROPONINI in the last 168 hours. CBG: Recent Labs  Lab 02/28/21 2020 02/28/21 2329  GLUCAP 77 97   Iron Studies: No results for input(s): IRON, TIBC, TRANSFERRIN, FERRITIN in the last 72 hours. Lab Results  Component Value Date   INR  1.1 03/02/2021   INR 1.3 (H) 02/28/2021   INR 1.2 11/28/2020      Medications: . sodium chloride     . (feeding supplement) PROSource Plus  30 mL Oral BID BM  . sodium chloride   Intravenous Once  . sodium chloride   Intravenous Once  . amiodarone  200 mg Oral Daily  . calcitRIOL  0.5 mcg Oral Q T,Th,Sa-HD  . calcium acetate  667 mg Oral TID WC  . Chlorhexidine Gluconate Cloth  6 each Topical Q0600  . Chlorhexidine Gluconate Cloth  6 each Topical Q0600  . feeding supplement (NEPRO CARB STEADY)  237 mL Oral TID BM  . levETIRAcetam  500 mg Oral BID  . metoprolol tartrate  25 mg Oral BID  . multivitamin  1 tablet Oral QHS  . pantoprazole  40 mg Oral Daily  . rosuvastatin  10 mg Oral Daily    Dialysis Orders: TTS [facility] 4h 400/500 37.5kg 2/2 bath RUA AVF/ RIJ TDC - sensipar 90 po tiw, on hold from 4/14 - mircera 150 q2 last 4.28 - calcitriol 1.25 ug tiw  Assessment/Plan: 1. Shock - acute, hypotension event very shortly after initiating HD, BP's were in the 70's at HD unit. Sent to ED. Seen by [facility],  amount of infiltration on exam was not consistent w/ a major bleed. Hb improved now at 11.6 s/p total 3 units since admission. Blood pressures currently stable.  2. ESRD - on HD TTS.  3. Bp/ volume - is vol overloaded on exam and by CT w/ significant ascites and areas of localized pitting edema. CXR R effusion w/o pulm edema. Max UF on HD tomorrow as tolerated.  4. Seizures- takes keppra 5. Atrial fib - on amio, holding BP w/ low BP's. Not on AC due to prior ICH, hx of anemia 6. Anemia ckd - aranesp dose given on 03/02/21. 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  Kelly Splinter, MD 03/04/2021, 4:54 PM

## 2021-03-04 NOTE — Progress Notes (Signed)
Called  pharmacy and questioned giving midodrine and lopressor together.  Dr. Candiss Norse was in pharmacy and said "yes, give them together".

## 2021-03-05 DIAGNOSIS — R571 Hypovolemic shock: Secondary | ICD-10-CM | POA: Diagnosis not present

## 2021-03-05 LAB — COMPREHENSIVE METABOLIC PANEL
ALT: 7 U/L (ref 0–44)
AST: 12 U/L — ABNORMAL LOW (ref 15–41)
Albumin: 1.5 g/dL — ABNORMAL LOW (ref 3.5–5.0)
Alkaline Phosphatase: 53 U/L (ref 38–126)
Anion gap: 11 (ref 5–15)
BUN: 25 mg/dL — ABNORMAL HIGH (ref 6–20)
CO2: 29 mmol/L (ref 22–32)
Calcium: 10.3 mg/dL (ref 8.9–10.3)
Chloride: 93 mmol/L — ABNORMAL LOW (ref 98–111)
Creatinine, Ser: 5.41 mg/dL — ABNORMAL HIGH (ref 0.44–1.00)
GFR, Estimated: 9 mL/min — ABNORMAL LOW (ref >60)
Glucose, Bld: 84 mg/dL (ref 70–99)
Potassium: 4 mmol/L (ref 3.5–5.1)
Sodium: 133 mmol/L — ABNORMAL LOW (ref 135–145)
Total Bilirubin: 0.6 mg/dL (ref 0.3–1.2)
Total Protein: 5 g/dL — ABNORMAL LOW (ref 6.5–8.1)

## 2021-03-05 LAB — CBC WITH DIFFERENTIAL/PLATELET
Abs Immature Granulocytes: 0.04 10*3/uL (ref 0.00–0.07)
Basophils Absolute: 0 10*3/uL (ref 0.0–0.1)
Basophils Relative: 0 %
Eosinophils Absolute: 0 10*3/uL (ref 0.0–0.5)
Eosinophils Relative: 0 %
HCT: 40.4 % (ref 36.0–46.0)
Hemoglobin: 13.2 g/dL (ref 12.0–15.0)
Immature Granulocytes: 1 %
Lymphocytes Relative: 15 %
Lymphs Abs: 1 10*3/uL (ref 0.7–4.0)
MCH: 27.6 pg (ref 26.0–34.0)
MCHC: 32.7 g/dL (ref 30.0–36.0)
MCV: 84.3 fL (ref 80.0–100.0)
Monocytes Absolute: 0.4 10*3/uL (ref 0.1–1.0)
Monocytes Relative: 5 %
Neutro Abs: 5.4 10*3/uL (ref 1.7–7.7)
Neutrophils Relative %: 79 %
Platelets: 158 10*3/uL (ref 150–400)
RBC: 4.79 MIL/uL (ref 3.87–5.11)
RDW: 18 % — ABNORMAL HIGH (ref 11.5–15.5)
WBC: 6.9 10*3/uL (ref 4.0–10.5)
nRBC: 0 % (ref 0.0–0.2)

## 2021-03-05 MED ORDER — LEVETIRACETAM 100 MG/ML PO SOLN
500.0000 mg | Freq: Two times a day (BID) | ORAL | Status: DC
  Administered 2021-03-05: 500 mg via ORAL
  Filled 2021-03-05: qty 5

## 2021-03-05 MED ORDER — MIDODRINE HCL 5 MG PO TABS
10.0000 mg | ORAL_TABLET | ORAL | Status: DC
  Administered 2021-03-07 – 2021-03-12 (×2): 10 mg via ORAL
  Filled 2021-03-05: qty 2

## 2021-03-05 MED ORDER — MIDODRINE HCL 5 MG PO TABS
ORAL_TABLET | ORAL | Status: AC
  Administered 2021-03-05: 10 mg via ORAL
  Filled 2021-03-05: qty 2

## 2021-03-05 MED ORDER — MIDODRINE HCL 5 MG PO TABS
5.0000 mg | ORAL_TABLET | Freq: Two times a day (BID) | ORAL | Status: DC
  Administered 2021-03-08 – 2021-03-11 (×6): 5 mg via ORAL
  Filled 2021-03-05 (×10): qty 1

## 2021-03-05 MED ORDER — PHENOL 1.4 % MT LIQD
1.0000 | OROMUCOSAL | Status: DC | PRN
  Administered 2021-03-05: 1 via OROMUCOSAL
  Filled 2021-03-05: qty 177

## 2021-03-05 MED ORDER — HEPARIN SODIUM (PORCINE) 1000 UNIT/ML IJ SOLN
INTRAMUSCULAR | Status: AC
  Administered 2021-03-05: 3200 [IU] via INTRAVENOUS_CENTRAL
  Filled 2021-03-05: qty 4

## 2021-03-05 MED ORDER — HEPARIN SODIUM (PORCINE) 1000 UNIT/ML DIALYSIS: 1500.0000 [IU] | INTRAMUSCULAR | Status: DC | PRN

## 2021-03-05 NOTE — Progress Notes (Addendum)
On call notified that pts rectal temp was 97.0.  Pts was layered with multiple blankets and a recheck performed for 97.1 rectal.  On call notified and a Bair Hugger was ordered.     Pt refused to take her PO meds for 2200.  Initially stating she had a sore throat.  Cool water was attempted but pt stated that it did not help.  Chloraseptic spray was not successful either. MD notified.   Approx 0330:  Tele called and stated that her Qtc was greater than 500.  Previously charted values were lower than this.  MD notified, EKG obtained.    Approx 0430  Pt would not move her R arm and kept shaking her head "no" when I attempted to get her to perform grips  I asked if it hurt, pt nodded yes.  I asked if she would just wiggle her fingers and she shook her hand no.  I asked if she could feel my had and she shook her head no.  Pt performed grips on L arm when asked.  Md notified of findings, Venous Doppler ordered.   --Edited for date change

## 2021-03-05 NOTE — TOC Initial Note (Signed)
Transition of Care [facility]) - Initial/Assessment Note    Patient Details  Name: [name] MRN: [id number] Date of Birth: 22-Oct-1960  Transition of Care [facility]) CM/SW Contact:    Benard Halsted, LCSW Phone Number: 03/05/2021, 1:04 PM  Clinical Narrative:                 CSW received return call from patient's sister, [name]. She stated that no one has been able to contact patient's daughters in a while and that she has been acting as patient's care coordinator. Patient has resided at [facility] for the past few months and [name] would like her to return there at discharge as her family cannot care for the patient. She requests [facility] for transport at discharge.   Expected Discharge Plan: Skilled Nursing Facility Barriers to Discharge: Continued Medical Work up   Patient Goals and CMS Choice Patient states their goals for this hospitalization and ongoing recovery are:: Return to snf CMS Medicare.gov Compare Post Acute Care list provided to:: Patient Represenative (must comment) Choice offered to / list presented to : Sibling  Expected Discharge Plan and Services Expected Discharge Plan: [place] In-house Referral: Clinical Social Work   Post Acute Care Choice: [place] Living arrangements for the past 2 months: [place]                                      Prior Living Arrangements/Services Living arrangements for the past 2 months: [place] Lives with:: Facility Resident Patient language and need for interpreter reviewed:: Yes Do you feel safe going back to the place where you live?: Yes      Need for Family Participation in Patient Care: Yes (Comment) Care giver support system in place?: Yes (comment)   Criminal Activity/Legal Involvement Pertinent to Current Situation/Hospitalization: No - Comment as needed  Activities of Daily Living      Permission Sought/Granted Permission sought to share information  with : Facility Contact Representative,Family Supports Permission granted to share information with : No  Share Information with NAME: [name]  Permission granted to share info w AGENCY: [facility]  Permission granted to share info w Relationship: Sister  Permission granted to share info w Contact Information: [phone]  Emotional Assessment Appearance:: Appears older than stated age Attitude/Demeanor/Rapport: Unable to Assess Affect (typically observed): Unable to Assess Orientation: : Oriented to Self,Oriented to Place Alcohol / Substance Use: Not Applicable Psych Involvement: No (comment)  Admission diagnosis:  Hemorrhagic shock ([place]) [R57.8] Patient Active Problem List   Diagnosis Date Noted  . Hemorrhagic shock ([place]) 02/28/2021  . Pressure injury of skin 11/29/2020  . Pleural effusion 11/28/2020  . Subdural fluid collection 11/27/2020  . Left wrist fracture 11/27/2020  . Leg DVT (deep venous thromboembolism), acute, bilateral ([place]) 11/27/2020  . Hyperthyroidism 11/27/2020  . Malnutrition of moderate degree 11/20/2020  . Epigastric abdominal pain 11/08/2020  . NSTEMI (non-ST elevated myocardial infarction) ([place]) 10/16/2020  . AMS (altered mental status) 10/06/2020  . Elevated troponin 10/06/2020  . Headache, unspecified 11/28/2019  . Hypercalcemia 11/10/2019  . Allergy, unspecified, initial encounter 08/08/2019  . Hypokalemia 07/07/2019  . Sepsis, unspecified organism ([place]) 02/03/2019  . End-stage renal disease on hemodialysis ([place]) 02/03/2019  . Unspecified atrial fibrillation ([place]) 02/03/2019  . Hypotension 02/02/2019  . Syncope and collapse   . Goals of care, counseling/discussion   . Atrial fibrillation with rapid ventricular response ([place])  03/20/2018  . Chills (without fever) 03/16/2018  . Iron deficiency anemia, unspecified 02/04/2018  . 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 Anemia in chronic kidney disease 12/30/2017  . Fever, unspecified 12/30/2017  .  Generalized abdominal pain 12/30/2017  . Heart failure, unspecified ([place]) 12/30/2017  . Other specified personal risk factors, not elsewhere classified 12/30/2017  . Pure hypercholesterolemia, unspecified 12/30/2017  . Secondary hyperparathyroidism of renal origin ([place]) 12/30/2017  . Shortness of breath 12/30/2017  . History of embolic stroke [id number]  . Altered mental status 11/08/2013  . HTN (hypertension) 11/08/2013  . Hypertensive renal disease 11/06/2013  . Protein-calorie malnutrition, severe ([place]) 11/04/2013  . Seizure disorder ([place]) 10/22/2013  . Hypertensive urgency 10/20/2013  . Hypertensive heart disease 05/07/2013  . Acute-on-chronic renal failure ([place]) 05/07/2013  . HTN (hypertension), malignant 05/06/2013  . CAD (coronary artery disease) 05/06/2013  . ESRD (end stage renal disease) ([place]) 05/06/2013  . Chronic diastolic heart failure ([place])   . Tobacco abuse 09/25/2012  . H/O noncompliance with medical treatment, presenting hazards to health   . Renal artery stenosis ([place]) 04/05/2012  . History of subarachnoid hemorrhage 03/03/2012  . Mixed hyperlipidemia 09/19/2008  . Acute on chronic diastolic heart failure ([place]) 09/19/2008   PCP:  Merryl Hacker, No Pharmacy:   [facility], [place] [address] [place] [address] Phone: [phone] Fax: [phone]     Social Determinants of Health (SDOH) Interventions    Readmission Risk Interventions Readmission Risk Prevention Plan 03/05/2021  Transportation Screening Complete  Medication Review ([place]) Complete  PCP or Specialist appointment within 3-5 days of discharge Complete  HRI or Home Care Consult Complete  SW Recovery Care/Counseling Consult Complete  Palliative Care Screening Complete  Skilled Nursing Facility Complete  Some recent data might be hidden

## 2021-03-05 NOTE — Progress Notes (Addendum)
CSW attempting to reach family regarding SNF placement:   Daughter [name] [phone] - number disconnected. [phone]- left vm.   Daughter [name] [phone]- Left voicemail.   Sister [name] [phone]- Left voicemail.  Mabeline Varas LCSW

## 2021-03-05 NOTE — Progress Notes (Addendum)
PROGRESS NOTE                                                                                                                                                                                                             Patient Demographics:    [name], is a 60 y.o. female, DOB - 1961/05/15, [id number]  Outpatient Primary MD for the patient is Pcp, No    LOS - 5  Admit date - 02/28/2021    Chief Complaint  Patient presents with  . Bleeding/Bruising       Brief Narrative (HPI from H&P) - The patient is a 60 y.o. year-old w/ hx of PAD bilat BKA, smoker, hx CVA (2015), [place] (2013), seizures, PAF, chronic memory issues, HL, HTN, ESRD on HD, CAD who presented to ED from HD unit after R arm AVF infiltrated and pt's BP dropped with AMS at the unit, Hb in the ER returned at 7.4 which was down from 13 in Feb 2022, she admitted to ICU got 1 unit PRBC, seen by VVS, stabilized and transferred to [facility] on 03/02/21 with HB 6.9.   Subjective:   Patient in bed, appears comfortable, denies any headache, no fever, no chest pain or pressure, no shortness of breath , no abdominal pain. No new focal weakness.    Assessment  & Plan :     1. Acute metabolic encephalopathy at HD unit with hypotension and acute drop in hemoglobin - there was a question if her right arm AV fistula played, she also has history of underlying seizures and subdural hematoma.  Her mental status is close to her baseline right now and she denies any headache, HB is 6.9 with no clear source of ongoing acute bleeding, PPI, will check DIC panel, LDH, haptoglobin, Occ Stool,  and CT abdomen pelvis to rule out any retroperitoneal loss, check echocardiogram to ensure Valves are stable.  Will monitor stools.  For now she has received 1 unit of packed RBC in the ICU and will give her 2 more units with HD on 03/02/2021. PPI,  Monitor H&H closely.  2.  History of seizures,  bilateral SDH, VP shunt.  No headache, supple neck, continue antiseizure medication Keppra which he takes at home.  Monitor closely.  Question if she had a fluid imbalance induced hypotension causing seizure activity at the dialysis  unit causing her AMS.  Note her mental status appears to be close to her baseline according to nephrologist Dr. Jonnie [name] who has seen her several times before.  3.  Acute anemia on top of anemia of chronic disease.  Unclear cause question if lab error showed extremely low H&H, after 2 units of packed RBC on 03/02/2021 her hemoglobin has gone from 6 to 11, CT scan abdomen pelvis was without any retroperitoneal bleed, echocardiogram abdominal aorta without any major valvular abnormalities, no signs of obvious ongoing bleeding.  Will monitor  4.  Right arm AV fistula infiltration.  Seen by vascular surgery.  5.  Severe failure to thrive and deconditioning - PT OT, will require SNF.  6. ESRD.  On TTS schedule.  Renal following.  Getting dialyzed through right IJ dialysis catheter.  Right AV fistula has infiltrated she has been seen by PVS and fistula has been repaired and being given rest at this time.   7. [id number] metoprolol/amiodarone-not a candidate for anticoagulation given ICH/SAH/anemia  8. H/O RLE DVT-Age indeterminate, not a candidate for anticoagulation due to subdural bleeds, previous admissionsIR was consulted for IVC filter-but was not recommended.  9. Hyperthyroidism:On beta-blocker-apparently methimazole was discontinued last admit - stable TSH, needs outpatient endocrine follow-up post discharge.  10.  Anasarca with ascites and effusion due to severe protein calorie malnutrition 1 and extremely poor nutritional status - asymptomatic but will place on protein supplements.  11.  TTE with stable EF and wall motion abnormality.  No chest pain, poor candidate for invasive testing or procedures, continue beta-blocker and statin for secondary prevention.   No acute issues.  Outpatient cardiology follow-up post discharge.  12.  Hypertension.  On nondialysis days her systolics are between [id number] and 140 requiring low-dose beta-blocker, however she seems to be extremely sensitive to fluid shifts and on dialysis days of frequently her systolic falls below 80, adding midodrine during dialysis times, discussed with nephrologist Dr. Jonnie [name].   Note her baseline quality of life seems to be extremely poor due to brain injury, extremely poor nutritional status, ESRD, Extremely poor functional status.  Will involve to care for goals of care.      Condition - Extremely Guarded  Family Communication  :    Daughter [name] [phone] - number disconnected  Daughter [name] [phone] - 03/02/21 message left at 10:16 AM, message left on 03/04/21 at 9:15 AM  Sister [name] [phone] on 03/02/21 message left at 10:17 AM, message left again on 03/03/2021 at 10:51 AM, 03/04/2021 at 9:16 AM - message left.   Code Status :  Full  Consults  :  PCCM, Renal, VVS, palliative care  PUD Prophylaxis : PPI   Procedures  :     TTE -  1. Left ventricular ejection fraction, by estimation, is 55 to 60%. The left ventricle has normal function. The left ventricle demonstrates regional wall motion abnormalities (see scoring diagram/findings for description). There is moderate asymmetric left ventricular hypertrophy of the septal segment. Left ventricular diastolic parameters are consistent with Grade II diastolic dysfunction (pseudonormalization).  2. Right ventricular systolic function is normal. The right ventricular size is normal. There is mildly elevated pulmonary artery systolic pressure. The estimated right ventricular systolic pressure is [id number] mmHg.  3. Left atrial size was moderately dilated.  4. Left pleural effusion present.  5. The mitral valve is grossly normal, mildly thickened. Mild mitral valve regurgitation.  6. The aortic valve is tricuspid. There is mild  calcification of the aortic valve. Aortic valve  regurgitation is not visualized. Mild aortic valve sclerosis is present, with no evidence of aortic valve stenosis.  7. The inferior vena cava is normal in size with <50% respiratory variability, suggesting right atrial pressure of 8 mmHg.  CT Bad pelvis - No RP bleed. - 1. No evidence of retroperitoneal hemorrhage. 2. Large volume simple fluid attenuation ascites throughout the abdomen and pelvis. Anasarca. 3. Moderate bilateral pleural effusions and associated atelectasis or consolidation. 4. Cardiomegaly and coronary artery disease. 5. Numerous low-attenuation lesions throughout the liver, incompletely characterized although most likely simple cysts. Aortic Atherosclerosis.  CT Head - Stable postoperative changes. Stable position of ventriculoperitoneal shunt with tip posterior to the third ventricle in the left midbrain region. Stable ventricular enlargement. Multiple prior infarcts, largest in the left parietal lobe region. No acute infarct evident. No mass, hemorrhage, or appreciable extra-axial fluid collections.      Disposition Plan  :    Status is: Inpatient  Remains inpatient appropriate because:IV treatments appropriate due to intensity of illness or inability to take PO   Dispo: The patient is from: Home              Anticipated d/c is to: Home              Patient currently is not medically stable to d/c.   Difficult to place patient No   DVT Prophylaxis  :    Place and maintain sequential compression device Start: 03/01/21 0910 SCDs Start: 02/28/21 1758   Lab Results  Component Value Date   PLT 158 03/05/2021    Diet :  Diet Order            Diet renal with fluid restriction Fluid restriction: 2000 mL Fluid; Room service appropriate? Yes; Fluid consistency: Thin  Diet effective now                  Inpatient Medications  Scheduled Meds: . (feeding supplement) PROSource Plus  30 mL Oral BID BM  . sodium  chloride   Intravenous Once  . sodium chloride   Intravenous Once  . amiodarone  200 mg Oral Daily  . calcitRIOL  0.5 mcg Oral Q T,Th,Sa-HD  . calcium acetate  667 mg Oral TID WC  . Chlorhexidine Gluconate Cloth  6 each Topical Q0600  . Chlorhexidine Gluconate Cloth  6 each Topical Q0600  . feeding supplement (NEPRO CARB STEADY)  237 mL Oral TID BM  . levETIRAcetam  500 mg Oral BID  . metoprolol tartrate  25 mg Oral BID  . midodrine  5 mg Oral BID WC  . multivitamin  1 tablet Oral QHS  . pantoprazole  40 mg Oral Daily  . rosuvastatin  10 mg Oral Daily   Continuous Infusions: . sodium chloride     PRN Meds:.docusate sodium, [START ON 03/06/2021] heparin, heparin sodium (porcine), ondansetron (ZOFRAN) IV, phenol, polyethylene glycol  Antibiotics  :    Anti-infectives (From admission, onward)   Start     Dose/Rate Route Frequency Ordered Stop   03/02/21 1200  vancomycin (VANCOCIN) IVPB 500 mg/100 ml premix  Status:  Discontinued        500 mg 100 mL/hr over 60 Minutes Intravenous Every T-Th-Sa (Hemodialysis) 02/28/21 1747 03/01/21 0824   03/01/21 1800  ceFEPIme (MAXIPIME) 1 g in sodium chloride 0.9 % 100 mL IVPB  Status:  Discontinued        1 g 200 mL/hr over 30 Minutes Intravenous Every 24 hours 02/28/21 1747 03/01/21 [id number]  02/28/21 1800  vancomycin (VANCOREADY) IVPB 1000 mg/200 mL        1,000 mg 200 mL/hr over 60 Minutes Intravenous  Once 02/28/21 1716 02/28/21 1957   02/28/21 1730  ceFEPIme (MAXIPIME) 2 g in sodium chloride 0.9 % 100 mL IVPB        2 g 200 mL/hr over 30 Minutes Intravenous  Once 02/28/21 1716 02/28/21 1855   02/28/21 1730  metroNIDAZOLE (FLAGYL) IVPB 500 mg        500 mg 100 mL/hr over 60 Minutes Intravenous  Once 02/28/21 1716 02/28/21 1938       Time Spent in minutes  30   Lala Lund M.D on 03/05/2021 at 9:59 AM  To page go to [email or web address]   [facility] -  Office  [phone]   See all Orders from today for further details     Objective:   Vitals:   03/05/21 0835 03/05/21 0900 03/05/21 0930 03/05/21 0945  BP: (!) 167/77 134/67 (!) 98/54 (!) 88/64  Pulse: 69 71 67 64  Resp: 12     Temp:      TempSrc:      SpO2:      Weight:      Height:        Wt Readings from Last 3 Encounters:  03/05/21 42 kg  11/30/20 47.2 kg  11/27/20 40.4 kg    No intake or output data in the 24 hours ending 03/05/21 0959   Physical Exam  Awake, more alert today, moves all 4 extremities to commands, R. IJ HD Cath intact, right arm AV fistula site stable [name],[name] Supple Neck,No JVD, No cervical lymphadenopathy appriciated.  Symmetrical Chest wall movement, Good air movement bilaterally, CTAB RRR,No Gallops, Rubs or new Murmurs, No Parasternal Heave +ve B.Sounds, Abd Soft, No tenderness, No organomegaly appriciated, No rebound - guarding or rigidity. No Cyanosis, Clubbing or edema, No new Rash or bruise     Data Review:    CBC Recent Labs  Lab 02/28/21 1406 03/01/21 0046 03/03/21 0150 03/03/21 0756 03/03/21 1615 03/04/21 0016 03/05/21 0237  WBC 4.9   < > 8.3 8.8 8.7 7.0  7.1 6.9  HGB 7.4*   < > 12.2 11.6* 12.3 12.2  12.1 13.2  HCT 25.5*   < > 35.8* 33.8* 36.9 37.1  36.8 40.4  PLT 155   < > 108* 116* 127* 143*  139* 158  MCV 87.3   < > 81.4 80.9 81.6 83.6  83.6 84.3  MCH 25.3*   < > 27.7 27.8 27.2 27.5  27.5 27.6  MCHC 29.0*   < > 34.1 34.3 33.3 32.9  32.9 32.7  RDW 19.3*   < > 17.0* 17.2* 17.2* 17.5*  17.5* 18.0*  LYMPHSABS 1.3  --  1.4  --   --  1.1 1.0  MONOABS 0.2  --  0.4  --   --  0.3 0.4  EOSABS 0.0  --  0.0  --   --  0.0 0.0  BASOSABS 0.0  --  0.0  --   --  0.0 0.0   < > = values in this interval not displayed.    Recent Labs  Lab 02/28/21 1406 02/28/21 1541 02/28/21 1606 03/01/21 0046 03/02/21 0833 03/02/21 0947 03/02/21 1010 03/03/21 0150 03/04/21 0016 03/05/21 0237  NA 134*  --   --  135 134*  --   --  133* 133* 133*  K 3.4*  --   --  3.7 3.6  --   --  3.5 3.6 4.0  CL 94*   --   --  96* 96*  --   --  96* 95* 93*  CO2 26  --   --  27 29  --   --  '29 28 29  '$ GLUCOSE 121*  --   --  93 87  --   --  88 94 84  BUN 15  --   --  17 22*  --   --  15 22* 25*  CREATININE 4.37*  --   --  4.12* 4.79*  --   --  3.56* 4.49* 5.41*  CALCIUM 9.0  --   --  9.2 9.2  --   --  9.3 9.9 10.3  AST 18  --   --   --   --   --   --   --   --  12*  ALT 8  --   --   --   --   --   --   --   --  7  ALKPHOS 54  --   --   --   --   --   --   --   --  53  BILITOT 0.7  --   --   --   --   --   --   --   --  0.6  ALBUMIN 1.5*  --   --   --  1.3*  --   --   --   --  1.5*  MG  --   --   --  2.0  --   --   --   --   --   --   DDIMER  --   --   --   --   --  14.76*  --   --   --   --   LATICACIDVEN 8.1*  --  8.7*  --   --   --   --   --   --   --   INR  --  1.3*  --   --   --  1.1  --   --   --   --   TSH  --   --   --   --   --   --  2.244  --   --   --     ------------------------------------------------------------------------------------------------------------------ No results for input(s): CHOL, HDL, LDLCALC, TRIG, CHOLHDL, LDLDIRECT in the last 72 hours.  Lab Results  Component Value Date   HGBA1C 5.2 10/07/2020   ------------------------------------------------------------------------------------------------------------------ Recent Labs    03/02/21 1010  TSH 2.244    Cardiac Enzymes No results for input(s): CKMB, TROPONINI, MYOGLOBIN in the last 168 hours.  Invalid input(s): CK ------------------------------------------------------------------------------------------------------------------    Component Value Date/Time   BNP >4,500.0 (H) 11/13/2020 1027    Micro Results Recent Results (from the past 240 hour(s))  Resp Panel by RT-PCR (Flu A&B, Covid) Nasopharyngeal Swab     Status: None   Collection Time: 02/28/21  5:26 PM   Specimen: Nasopharyngeal Swab; Nasopharyngeal(NP) swabs in vial transport medium  Result Value Ref Range Status   SARS Coronavirus 2 by RT PCR  NEGATIVE NEGATIVE Final    Comment: (NOTE) SARS-CoV-2 target nucleic acids are NOT DETECTED.  The SARS-CoV-2 RNA is generally detectable in upper respiratory specimens during the acute phase of infection. The lowest concentration of SARS-CoV-2 viral copies this assay can detect is 138 copies/mL. A negative result does not  preclude SARS-Cov-2 infection and should not be used as the sole basis for treatment or other patient management decisions. A negative result may occur with  improper specimen collection/handling, submission of specimen other than nasopharyngeal swab, presence of viral mutation(s) within the areas targeted by this assay, and inadequate number of viral copies(<138 copies/mL). A negative result must be combined with clinical observations, patient history, and epidemiological information. The expected result is Negative.  Fact Sheet for Patients:  EntrepreneurPulse.com.au  Fact Sheet for Healthcare Providers:  IncredibleEmployment.be  This test is no t yet approved or cleared by the [place] FDA and  has been authorized for detection and/or diagnosis of SARS-CoV-2 by FDA under an Emergency Use Authorization (EUA). This EUA will remain  in effect (meaning this test can be used) for the duration of the COVID-19 declaration under Section 564(b)(1) of the Act, 21 U.S.C.section 360bbb-3(b)(1), unless the authorization is terminated  or revoked sooner.       Influenza A by PCR NEGATIVE NEGATIVE Final   Influenza B by PCR NEGATIVE NEGATIVE Final    Comment: (NOTE) The Xpert Xpress SARS-CoV-2/FLU/RSV plus assay is intended as an aid in the diagnosis of influenza from Nasopharyngeal swab specimens and should not be used as a sole basis for treatment. Nasal washings and aspirates are unacceptable for Xpert Xpress SARS-CoV-2/FLU/RSV testing.  Fact Sheet for Patients: EntrepreneurPulse.com.au  Fact Sheet for  Healthcare Providers: IncredibleEmployment.be  This test is not yet approved or cleared by the [place] FDA and has been authorized for detection and/or diagnosis of SARS-CoV-2 by FDA under an Emergency Use Authorization (EUA). This EUA will remain in effect (meaning this test can be used) for the duration of the COVID-19 declaration under Section 564(b)(1) of the Act, 21 U.S.C. section 360bbb-3(b)(1), unless the authorization is terminated or revoked.  Performed at [facility], [place] [address], [place], [place] [address]   MRSA PCR Screening     Status: None   Collection Time: 02/28/21  7:40 PM   Specimen: Nasopharyngeal  Result Value Ref Range Status   MRSA by PCR NEGATIVE NEGATIVE Final    Comment:        The GeneXpert MRSA Assay (FDA approved for NASAL specimens only), is one component of a comprehensive MRSA colonization surveillance program. It is not intended to diagnose MRSA infection nor to guide or monitor treatment for MRSA infections. Performed at [facility], [place] [address], [place], [place] [address]   Culture, blood (single)     Status: None (Preliminary result)   Collection Time: 03/01/21 12:46 AM   Specimen: BLOOD LEFT HAND  Result Value Ref Range Status   Specimen Description BLOOD LEFT HAND  Final   Special Requests   Final    BOTTLES DRAWN AEROBIC AND ANAEROBIC Blood Culture results may not be optimal due to an inadequate volume of blood received in culture bottles   Culture   Final    NO GROWTH 4 DAYS Performed at [facility], [place] [address] [place], [place] [address]    Report Status PENDING  Incomplete    Radiology Reports CT ABDOMEN PELVIS WO CONTRAST  Result Date: 03/02/2021 CLINICAL DATA:  Anemia, evaluate for retroperitoneal bleed EXAM: CT ABDOMEN AND PELVIS WITHOUT CONTRAST TECHNIQUE: Multidetector CT imaging of the abdomen and pelvis was performed following the standard protocol without IV  contrast. COMPARISON:  11/09/2020 FINDINGS: Lower chest: Moderate bilateral pleural effusions and associated atelectasis or consolidation. Cardiomegaly. Extensive 3 vessel coronary artery  calcifications. Hepatobiliary: No solid liver abnormality is seen. Numerous low-attenuation lesions throughout the liver, incompletely characterized although most likely simple cysts. No gallstones, gallbladder wall thickening, or biliary dilatation. Pancreas: Unremarkable. No pancreatic ductal dilatation or surrounding inflammatory changes. Spleen: Normal in size without significant abnormality. Adrenals/Urinary Tract: Adrenal glands are unremarkable. Atrophic appearance of the kidneys. Multiple bilateral small calculi and/or vascular calcifications. No hydronephrosis. Bladder is unremarkable. Stomach/Bowel: Stomach is within normal limits. Appendix appears normal. No evidence of bowel wall thickening, distention, or inflammatory changes. Vascular/Lymphatic: Severe, pipe like aortic atherosclerosis. No enlarged abdominal or pelvic lymph nodes. Reproductive: Status post hysterectomy. Other: No abdominal wall hernia or abnormality. Anasarca. Large volume ascites throughout the abdomen and pelvis. Shunt catheter tubing gently looped around the abdomen, tip position in the posterior right hemipelvis (series 3, image 82). Musculoskeletal: No acute or significant osseous findings. IMPRESSION: 1. No evidence of retroperitoneal hemorrhage. 2. Large volume simple fluid attenuation ascites throughout the abdomen and pelvis. Anasarca. 3. Moderate bilateral pleural effusions and associated atelectasis or consolidation. 4. Cardiomegaly and coronary artery disease. 5. Numerous low-attenuation lesions throughout the liver, incompletely characterized although most likely simple cysts. Aortic Atherosclerosis (ICD10-I70.0). Electronically Signed   By: Eddie Candle M.D.   On: 03/02/2021 16:10   DG Chest 1 View  Result Date: 02/28/2021 CLINICAL  DATA:  The patient became unresponsive during dialysis today. EXAM: CHEST  1 VIEW COMPARISON:  11/28/2020 FINDINGS: Stable right jugular double-lumen catheter, ventriculoperitoneal shunt tubing and enlarged cardiac silhouette. The aorta remains tortuous and partially calcified. No significant change in a moderate-sized right pleural effusion. Decreased associated right basilar atelectasis. Clear left lung. Diffuse osteopenia. IMPRESSION: 1. No acute abnormality. 2. Stable moderate-sized right pleural effusion. 3. Stable cardiomegaly and decreased right basilar atelectasis. Electronically Signed   By: Claudie Revering M.D.   On: 02/28/2021 14:43   CT Head Wo Contrast  Result Date: 02/28/2021 CLINICAL DATA:  Altered mental status EXAM: CT HEAD WITHOUT CONTRAST TECHNIQUE: Contiguous axial images were obtained from the base of the skull through the vertex without intravenous contrast. COMPARISON:  November 28, 2020, November 19, 2020 common November 17, 2020 FINDINGS: Brain: There is a ventriculostomy catheter inserted from the right frontal region with the tip in the left mid brain slightly posterior to the third ventricle, unchanged. There remains moderate generalized ventricular prominence. There is a degree of underlying sulcal atrophy, stable. There is no intracranial mass or hemorrhage. Previous small extra-axial fluid collections are no longer evident. No subdural or epidural fluid collections are appreciable currently. There is no midline shift. There is evidence of encephalomalacia in the left parietal lobe consistent with prior infarct, stable. There is small vessel disease throughout the centra semiovale bilaterally, stable. There are posterior cerebellar infarcts bilaterally, stable. There is evidence of a prior infarct involving the anterior limbs of the left external and extreme capsules with infarct in the anterior left claustrum. No acute appearing infarct is evident. Vascular: Apparent apparent coil at  basilar tip level is stable. There is a clip in the right middle cerebral artery distribution. There is calcification in each carotid siphon region. There is also calcification in each distal vertebral artery. No hyperdense vessel evident. Skull: Evidence of previous craniotomy on the right in the mid to posterior frontal region. No new bone lesions. Sinuses/Orbits: Paranasal sinuses clear. Orbits appear symmetric bilaterally. Other: Mastoid air cells clear. IMPRESSION: Stable postoperative changes. Stable position of ventriculoperitoneal shunt with tip posterior to the third ventricle in the left midbrain region. Stable  ventricular enlargement. Multiple prior infarcts, largest in the left parietal lobe region. No acute infarct evident. No mass, hemorrhage, or appreciable extra-axial fluid collections. Multiple foci of arterial vascular calcification evident. Electronically Signed   By: Lowella Grip III M.D.   On: 02/28/2021 16:06   ECHOCARDIOGRAM COMPLETE  Result Date: 03/03/2021    ECHOCARDIOGRAM REPORT   Patient Name:   JELEESA TIERNAN Date of Exam: 03/03/2021 Medical Rec #:  [id number]    Height:       61.0 in Accession #:    [id number]   Weight:       104.9 lb Date of Birth:  [birth year]    BSA:          1.436 m Patient Age:    52 years     BP:           140/58 mmHg Patient Gender: F            HR:           64 bpm. Exam Location:  Inpatient Procedure: 2D Echo, Cardiac Doppler and Color Doppler Indications:    CHF-Acute Diastolic [id number] / [id number]  History:        Patient has prior history of Echocardiogram examinations, most                 recent 10/07/2020. CHF, CAD, Signs/Symptoms:Syncope and Dyspnea;                 Risk Factors:Hypertension, Dyslipidemia and Current Smoker.                 Altered Mental Status, End-stage renal disease on hemodialysis                 ([place]).  Sonographer:    Alvino Chapel RCS Referring Phys: [id number] [facility]  Sonographer Comments: Patient could NOT sniff. IMPRESSIONS   1. Left ventricular ejection fraction, by estimation, is 55 to 60%. The left ventricle has normal function. The left ventricle demonstrates regional wall motion abnormalities (see scoring diagram/findings for description). There is moderate asymmetric left ventricular hypertrophy of the septal segment. Left ventricular diastolic parameters are consistent with Grade II diastolic dysfunction (pseudonormalization).  2. Right ventricular systolic function is normal. The right ventricular size is normal. There is mildly elevated pulmonary artery systolic pressure. The estimated right ventricular systolic pressure is [id number] mmHg.  3. Left atrial size was moderately dilated.  4. Left pleural effusion present.  5. The mitral valve is grossly normal, mildly thickened. Mild mitral valve regurgitation.  6. The aortic valve is tricuspid. There is mild calcification of the aortic valve. Aortic valve regurgitation is not visualized. Mild aortic valve sclerosis is present, with no evidence of aortic valve stenosis.  7. The inferior vena cava is normal in size with <50% respiratory variability, suggesting right atrial pressure of 8 mmHg. FINDINGS  Left Ventricle: Left ventricular ejection fraction, by estimation, is 55 to 60%. The left ventricle has normal function. The left ventricle demonstrates regional wall motion abnormalities. The left ventricular internal cavity size was normal in size. There is moderate asymmetric left ventricular hypertrophy of the septal segment. Left ventricular diastolic parameters are consistent with Grade II diastolic dysfunction (pseudonormalization).  LV Wall Scoring: The basal inferior segment is hypokinetic. The entire anterior wall, entire lateral wall, entire septum, entire apex, and mid and distal inferior wall are normal. Right Ventricle: The right ventricular size is normal. No increase in right ventricular wall thickness. Right ventricular systolic function  is normal. There is mildly elevated  pulmonary artery systolic pressure. The tricuspid regurgitant velocity is 2.78  m/s, and with an assumed right atrial pressure of 8 mmHg, the estimated right ventricular systolic pressure is [id number] mmHg. Left Atrium: Left atrial size was moderately dilated. Right Atrium: Right atrial size was normal in size. Pericardium: Left pleural effusion present. There is no evidence of pericardial effusion. Mitral Valve: The mitral valve is grossly normal. There is mild thickening of the mitral valve leaflet(s). Mild mitral valve regurgitation. Tricuspid Valve: The tricuspid valve is grossly normal. Tricuspid valve regurgitation is mild. Aortic Valve: The aortic valve is tricuspid. There is mild calcification of the aortic valve. There is mild aortic valve annular calcification. Aortic valve regurgitation is not visualized. Mild aortic valve sclerosis is present, with no evidence of aortic valve stenosis. Pulmonic Valve: The pulmonic valve was grossly normal. Pulmonic valve regurgitation is trivial. Aorta: The aortic root is normal in size and structure. Venous: The inferior vena cava is normal in size with less than 50% respiratory variability, suggesting right atrial pressure of 8 mmHg. IAS/Shunts: No atrial level shunt detected by color flow Doppler.  LEFT VENTRICLE PLAX 2D LVIDd:         4.00 cm  Diastology LVIDs:         2.80 cm  LV e' medial:    4.90 cm/s LV PW:         1.10 cm  LV E/e' medial:  22.9 LV IVS:        1.40 cm  LV e' lateral:   9.46 cm/s LVOT diam:     1.70 cm  LV E/e' lateral: 11.8 LV SV:         50 LV SV Index:   35 LVOT Area:     2.27 cm  RIGHT VENTRICLE RV S prime:     8.81 cm/s TAPSE (M-mode): 1.5 cm LEFT ATRIUM             Index       RIGHT ATRIUM           Index LA diam:        3.80 cm 2.65 cm/m  RA Area:     15.50 cm LA Vol (A2C):   55.2 ml 38.44 ml/m RA Volume:   36.10 ml  25.14 ml/m LA Vol (A4C):   62.1 ml 43.25 ml/m LA Biplane Vol: 61.6 ml 42.90 ml/m  AORTIC VALVE LVOT Vmax:   102.00 cm/s  LVOT Vmean:  57.400 cm/s LVOT VTI:    0.221 m  AORTA Ao Root diam: 2.90 cm MITRAL VALVE                TRICUSPID VALVE MV Area (PHT): 4.80 cm     TR Peak grad:   30.9 mmHg MV Decel Time: 158 msec     TR Vmax:        278.00 cm/s MV E velocity: 112.00 cm/s MV A velocity: 94.70 cm/s   SHUNTS MV E/A ratio:  1.18         Systemic VTI:  0.22 m                             Systemic Diam: 1.70 cm Rozann Lesches MD Electronically signed by Rozann Lesches MD Signature Date/Time: 03/03/2021/2:24:36 PM    Final

## 2021-03-05 NOTE — Progress Notes (Signed)
[facility] Progress Note   Subjective:   Patient seen and examined at bedside during dialysis.  Tolerating well so far. Reports being hungry.  No additional complaints.  Denies CP, SOB and n/v/d.   Objective Vitals:   03/05/21 1030 03/05/21 1045 03/05/21 1100 03/05/21 1115  BP: (!) 89/43 114/64 123/62 (!) 87/46  Pulse: 79 77 79 83  Resp:      Temp:      TempSrc:      SpO2:      Weight:      Height:       Physical Exam General:frail, chroncially ill appearing female in NAD Heart:RRR, no MRG Lungs:CTAB anteriorly, nml WOB on RA Abdomen:soft, distended, +ascites, mildly tender with palpation, +BS Extremities:1+dependent hip edema, +RUE edema, no calf/pedal edema Dialysis Access: RU AVF+bruit, R IJ TDC in use   Filed Weights   03/04/21 1620 03/05/21 0500 03/05/21 0827  Weight: 47.8 kg 42.3 kg 42 kg   No intake or output data in the 24 hours ending 03/05/21 1206  Additional Objective Labs: Basic Metabolic Panel: Recent Labs  Lab 03/01/21 0046 03/02/21 0833 03/03/21 0150 03/04/21 0016 03/05/21 0237  NA 135 134* 133* 133* 133*  K 3.7 3.6 3.5 3.6 4.0  CL 96* 96* 96* 95* 93*  CO2 '27 29 29 28 29  '$ GLUCOSE 93 87 88 94 84  BUN 17 22* 15 22* 25*  CREATININE 4.12* 4.79* 3.56* 4.49* 5.41*  CALCIUM 9.2 9.2 9.3 9.9 10.3  PHOS 5.4* 5.3*  --   --   --    Liver Function Tests: Recent Labs  Lab 02/28/21 1406 03/02/21 0833 03/05/21 0237  AST 18  --  12*  ALT 8  --  7  ALKPHOS 54  --  53  BILITOT 0.7  --  0.6  PROT 4.7*  --  5.0*  ALBUMIN 1.5* 1.3* 1.5*   CBC: Recent Labs  Lab 03/03/21 0150 03/03/21 0756 03/03/21 1615 03/04/21 0016 03/05/21 0237  WBC 8.3 8.8 8.7 7.0  7.1 6.9  NEUTROABS 6.5  --   --  5.5 5.4  HGB 12.2 11.6* 12.3 12.2  12.1 13.2  HCT 35.8* 33.8* 36.9 37.1  36.8 40.4  MCV 81.4 80.9 81.6 83.6  83.6 84.3  PLT 108* 116* 127* 143*  139* 158   Blood Culture    Component Value Date/Time   SDES BLOOD LEFT HAND 03/01/2021 0046    SPECREQUEST  03/01/2021 0046    BOTTLES DRAWN AEROBIC AND ANAEROBIC Blood Culture results may not be optimal due to an inadequate volume of blood received in culture bottles   CULT  03/01/2021 0046    NO GROWTH 4 DAYS Performed at [facility], [address], [place], [place] [address]    REPTSTATUS PENDING 03/01/2021 0046   CBG: Recent Labs  Lab 02/28/21 2020 02/28/21 2329  GLUCAP 77 97   Studies/Results: ECHOCARDIOGRAM COMPLETE  Result Date: 03/03/2021    ECHOCARDIOGRAM REPORT   Patient Name:   [name] Date of Exam: 03/03/2021 Medical Rec #:  [id number]    Height:       61.0 in Accession #:    [id number]   Weight:       104.9 lb Date of Birth:  1960/11/01    BSA:          1.436 m Patient Age:    38 years     BP:           140/58 mmHg  Patient Gender: F            HR:           64 bpm. Exam Location:  Inpatient Procedure: 2D Echo, Cardiac Doppler and Color Doppler Indications:    CHF-Acute Diastolic [id number] / [id number]  History:        Patient has prior history of Echocardiogram examinations, most                 recent 10/07/2020. CHF, CAD, Signs/Symptoms:Syncope and Dyspnea;                 Risk Factors:Hypertension, Dyslipidemia and Current Smoker.                 Altered Mental Status, End-stage renal disease on hemodialysis                 ([place]).  Sonographer:    Alvino Chapel RCS Referring Phys: [id number] Margaree Mackint[facility]  Sonographer Comments: Patient could NOT sniff. IMPRESSIONS  1. Left ventricular ejection fraction, by estimation, is 55 to 60%. The left ventricle has normal function. The left ventricle demonstrates regional wall motion abnormalities (see scoring diagram/findings for description). There is moderate asymmetric left ventricular hypertrophy of the septal segment. Left ventricular diastolic parameters are consistent with Grade II diastolic dysfunction (pseudonormalization).  2. Right ventricular systolic function is normal. The right ventricular size is normal. There is  mildly elevated pulmonary artery systolic pressure. The estimated right ventricular systolic pressure is [id number] mmHg.  3. Left atrial size was moderately dilated.  4. Left pleural effusion present.  5. The mitral valve is grossly normal, mildly thickened. Mild mitral valve regurgitation.  6. The aortic valve is tricuspid. There is mild calcification of the aortic valve. Aortic valve regurgitation is not visualized. Mild aortic valve sclerosis is present, with no evidence of aortic valve stenosis.  7. The inferior vena cava is normal in size with <50% respiratory variability, suggesting right atrial pressure of 8 mmHg. FINDINGS  Left Ventricle: Left ventricular ejection fraction, by estimation, is 55 to 60%. The left ventricle has normal function. The left ventricle demonstrates regional wall motion abnormalities. The left ventricular internal cavity size was normal in size. There is moderate asymmetric left ventricular hypertrophy of the septal segment. Left ventricular diastolic parameters are consistent with Grade II diastolic dysfunction (pseudonormalization).  LV Wall Scoring: The basal inferior segment is hypokinetic. The entire anterior wall, entire lateral wall, entire septum, entire apex, and mid and distal inferior wall are normal. Right Ventricle: The right ventricular size is normal. No increase in right ventricular wall thickness. Right ventricular systolic function is normal. There is mildly elevated pulmonary artery systolic pressure. The tricuspid regurgitant velocity is 2.78  m/s, and with an assumed right atrial pressure of 8 mmHg, the estimated right ventricular systolic pressure is [id number] mmHg. Left Atrium: Left atrial size was moderately dilated. Right Atrium: Right atrial size was normal in size. Pericardium: Left pleural effusion present. There is no evidence of pericardial effusion. Mitral Valve: The mitral valve is grossly normal. There is mild thickening of the mitral valve leaflet(s). Mild  mitral valve regurgitation. Tricuspid Valve: The tricuspid valve is grossly normal. Tricuspid valve regurgitation is mild. Aortic Valve: The aortic valve is tricuspid. There is mild calcification of the aortic valve. There is mild aortic valve annular calcification. Aortic valve regurgitation is not visualized. Mild aortic valve sclerosis is present, with no evidence of aortic valve stenosis. Pulmonic Valve: The pulmonic valve was grossly  normal. Pulmonic valve regurgitation is trivial. Aorta: The aortic root is normal in size and structure. Venous: The inferior vena cava is normal in size with less than 50% respiratory variability, suggesting right atrial pressure of 8 mmHg. IAS/Shunts: No atrial level shunt detected by color flow Doppler.  LEFT VENTRICLE PLAX 2D LVIDd:         4.00 cm  Diastology LVIDs:         2.80 cm  LV e' medial:    4.90 cm/s LV PW:         1.10 cm  LV E/e' medial:  22.9 LV IVS:        1.40 cm  LV e' lateral:   9.46 cm/s LVOT diam:     1.70 cm  LV E/e' lateral: 11.8 LV SV:         50 LV SV Index:   35 LVOT Area:     2.27 cm  RIGHT VENTRICLE RV S prime:     8.81 cm/s TAPSE (M-mode): 1.5 cm LEFT ATRIUM             Index       RIGHT ATRIUM           Index LA diam:        3.80 cm 2.65 cm/m  RA Area:     15.50 cm LA Vol (A2C):   55.2 ml 38.44 ml/m RA Volume:   36.10 ml  25.14 ml/m LA Vol (A4C):   62.1 ml 43.25 ml/m LA Biplane Vol: 61.6 ml 42.90 ml/m  AORTIC VALVE LVOT Vmax:   102.00 cm/s LVOT Vmean:  57.400 cm/s LVOT VTI:    0.221 m  AORTA Ao Root diam: 2.90 cm MITRAL VALVE                TRICUSPID VALVE MV Area (PHT): 4.80 cm     TR Peak grad:   30.9 mmHg MV Decel Time: 158 msec     TR Vmax:        278.00 cm/s MV E velocity: 112.00 cm/s MV A velocity: 94.70 cm/s   SHUNTS MV E/A ratio:  1.18         Systemic VTI:  0.22 m                             Systemic Diam: 1.70 cm Rozann Lesches MD Electronically signed by Rozann Lesches MD Signature Date/Time: 03/03/2021/2:24:36 PM    Final      Medications: . sodium chloride     . (feeding supplement) PROSource Plus  30 mL Oral BID BM  . sodium chloride   Intravenous Once  . sodium chloride   Intravenous Once  . amiodarone  200 mg Oral Daily  . calcitRIOL  0.5 mcg Oral Q T,Th,Sa-HD  . calcium acetate  667 mg Oral TID WC  . Chlorhexidine Gluconate Cloth  6 each Topical Q0600  . Chlorhexidine Gluconate Cloth  6 each Topical Q0600  . feeding supplement (NEPRO CARB STEADY)  237 mL Oral TID BM  . levETIRAcetam  500 mg Oral BID  . metoprolol tartrate  25 mg Oral BID  . midodrine  10 mg Oral Q T,Th,Sa-HD  . midodrine  5 mg Oral BID WC  . multivitamin  1 tablet Oral QHS  . pantoprazole  40 mg Oral Daily  . rosuvastatin  10 mg Oral Daily    Dialysis Orders: TTS [facility] 4h 400/500 37.5kg 2/2  bath RUA AVF/ RIJ TDC - sensipar 90 po tiw, on hold from 4/14 - mircera 150 q2 last 4.28 - calcitriol 1.25 ug tiw  Assessment/Plan: 1. Shock- acute, hypotension event very shortly after initiating HD, BP's were in the 70's at HD unit. Sent to ED. Noted drop in hgb in ED, pRBC given.  2. AMS - 2/2 seizure activity vs hypotension. appears to be back at baseline.  3. R arm pain/infiltration - Seen by [facility], infiltration noted but not enough bleeding to be cause of drop in Hgb.  Plan to rest AVF x 2wks.  Hb improved now at 11.6 s/p total 3 units since admission.   4. ESRD- on HD TTS. HD today per regular schedule.  5. Bp/ volume - BP improved and mostly well controlled.  Midodrine added on dialysis days. Vol overloaded on exam and by CT w/ significant ascites and areas of localized pitting edema. Maximize UF and titrate down volume as tolerated.   6. Seizures- takes keppra 7. Atrial fib- on amio, holding BB w/ low BP's. Not on AC due to prior ICH, hx of anemia 8. Anemia ckd- aranesp dose given on 03/02/21, d/c now due to hgb>12.  Hgb drop from 10 as outpatient on to 7.5 in the ED the same day (5/12), further decline on 5/14 to 6.8.  Now  s/p 3units of blood since admit and improved to 13.2.  Last month had drop in HGb as well, stool cards given but not completed, improved with Kirby Funk.  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 cont binders and calcitriol-Sensipar on hold from outpatient. 10. Nutrition - renal diet w/fluid restrictions.     Jen Mow, PA-C [facility] 03/05/2021,12:06 PM  LOS: 5 days

## 2021-03-06 ENCOUNTER — Other Ambulatory Visit: Payer: 59

## 2021-03-06 DIAGNOSIS — R627 Adult failure to thrive: Secondary | ICD-10-CM | POA: Diagnosis not present

## 2021-03-06 DIAGNOSIS — R5381 Other malaise: Secondary | ICD-10-CM | POA: Diagnosis not present

## 2021-03-06 DIAGNOSIS — R571 Hypovolemic shock: Secondary | ICD-10-CM

## 2021-03-06 DIAGNOSIS — R578 Other shock: Secondary | ICD-10-CM | POA: Diagnosis not present

## 2021-03-06 LAB — COMPREHENSIVE METABOLIC PANEL
ALT: 7 U/L (ref 0–44)
AST: 15 U/L (ref 15–41)
Albumin: 1.5 g/dL — ABNORMAL LOW (ref 3.5–5.0)
Alkaline Phosphatase: 51 U/L (ref 38–126)
Anion gap: 8 (ref 5–15)
BUN: 15 mg/dL (ref 6–20)
CO2: 28 mmol/L (ref 22–32)
Calcium: 9.6 mg/dL (ref 8.9–10.3)
Chloride: 100 mmol/L (ref 98–111)
Creatinine, Ser: 3.98 mg/dL — ABNORMAL HIGH (ref 0.44–1.00)
GFR, Estimated: 12 mL/min — ABNORMAL LOW (ref >60)
Glucose, Bld: 83 mg/dL (ref 70–99)
Potassium: 3.7 mmol/L (ref 3.5–5.1)
Sodium: 136 mmol/L (ref 135–145)
Total Bilirubin: 0.6 mg/dL (ref 0.3–1.2)
Total Protein: 4.9 g/dL — ABNORMAL LOW (ref 6.5–8.1)

## 2021-03-06 LAB — CBC WITH DIFFERENTIAL/PLATELET
Abs Immature Granulocytes: 0.02 10*3/uL (ref 0.00–0.07)
Basophils Absolute: 0 10*3/uL (ref 0.0–0.1)
Basophils Relative: 0 %
Eosinophils Absolute: 0 10*3/uL (ref 0.0–0.5)
Eosinophils Relative: 0 %
HCT: 40.7 % (ref 36.0–46.0)
Hemoglobin: 13 g/dL (ref 12.0–15.0)
Immature Granulocytes: 0 %
Lymphocytes Relative: 18 %
Lymphs Abs: 1.1 10*3/uL (ref 0.7–4.0)
MCH: 27.8 pg (ref 26.0–34.0)
MCHC: 31.9 g/dL (ref 30.0–36.0)
MCV: 87.2 fL (ref 80.0–100.0)
Monocytes Absolute: 0.4 10*3/uL (ref 0.1–1.0)
Monocytes Relative: 6 %
Neutro Abs: 4.5 10*3/uL (ref 1.7–7.7)
Neutrophils Relative %: 76 %
Platelets: UNDETERMINED 10*3/uL (ref 150–400)
RBC: 4.67 MIL/uL (ref 3.87–5.11)
RDW: 18.3 % — ABNORMAL HIGH (ref 11.5–15.5)
WBC: 5.9 10*3/uL (ref 4.0–10.5)
nRBC: 0.3 % — ABNORMAL HIGH (ref 0.0–0.2)

## 2021-03-06 LAB — CULTURE, BLOOD (SINGLE): Culture: NO GROWTH

## 2021-03-06 MED ORDER — METOPROLOL TARTRATE 5 MG/5ML IV SOLN
2.5000 mg | Freq: Three times a day (TID) | INTRAVENOUS | Status: DC
  Administered 2021-03-06 – 2021-03-11 (×16): 2.5 mg via INTRAVENOUS
  Filled 2021-03-06 (×18): qty 5

## 2021-03-06 MED ORDER — PANTOPRAZOLE SODIUM 40 MG IV SOLR
40.0000 mg | INTRAVENOUS | Status: DC
  Administered 2021-03-06 – 2021-03-08 (×3): 40 mg via INTRAVENOUS
  Filled 2021-03-06 (×3): qty 40

## 2021-03-06 MED ORDER — LEVETIRACETAM IN NACL 500 MG/100ML IV SOLN
500.0000 mg | Freq: Two times a day (BID) | INTRAVENOUS | Status: DC
  Administered 2021-03-06 – 2021-03-08 (×6): 500 mg via INTRAVENOUS
  Filled 2021-03-06 (×6): qty 100

## 2021-03-06 MED ORDER — CHLORHEXIDINE GLUCONATE CLOTH 2 % EX PADS
6.0000 | MEDICATED_PAD | Freq: Every day | CUTANEOUS | Status: DC
  Administered 2021-03-07: 6 via TOPICAL

## 2021-03-06 NOTE — Progress Notes (Signed)
Orthopedic Tech Progress Note Patient Details:  Spring Walczyk 1961/05/20 [id number] MD wrote an order requesting BLE PREVALON BOOTS, I called up to the floor and asked the secretary to order from materials  Patient ID: [name], female   DOB: November 29, 1960, 60 y.o.   MRN: [id number]   Janit Pagan 03/06/2021, 10:09 AM

## 2021-03-06 NOTE — Progress Notes (Signed)
PROGRESS NOTE    [name]  [id number] DOB: 04-10-1961 DOA: 02/28/2021 PCP: Pcp, No  Brief Narrative:  The patient is a extremely thin cachectic and chronically ill-appearing 60 year old African-American female with a past medical history significant for PAD, history of CVA, history of subarachnoid hemorrhage, history of seizures, history of proximal atrial fibrillation, history of chronic urinary issues, hyperlipidemia, hypertension, ESRD on hemodialysis, COPD as well as other comorbidities who presented to ED from her hemodialysis unit after right arm AV fistula infiltrated and her blood pressure dropped.  She became extremely altered and hemoglobin in the ER returned to 7.4 which is down from 13.  She was admitted to the ICU and got 1 unit PRBCs and seen by vascular surgery stabilized and transferred to [facility] on 03/02/2021 with a hemoglobin 6.9.  Subsequently she received 2 more units and hemoglobin improved and is now stable.  Her p.o. and intake has been extremely marginal and poor.  She has failure to thrive and palliative care has been consulted for goals of care discussion.  Assessment & Plan:   Active Problems:   Hemorrhagic shock (HCC)  Acute metabolic encephalopathy and hemodialysis with hypotension/shock and acute drop in hemoglobin -Initially there was a question of her right arm AV fistula played, she also has a history of underlying seizures and subdural hematoma -Discussed with nephrology and they feel that her mental status is close to being baseline now however they feel that her appetite used to be good but not as good now.  We will obtain a calorie count -She had a hemoglobin of 6.9 initially but this is improved and her last hemoglobin/hematocrit was 13.0/40.7 -A DIC panel was checked including LDH, haptoglobin and occult stool and a CT of the abdomen pelvis was done to rule out any retroperitoneal loss -She received 1 unit of PRBCs in ICU and she was given 2 more units of  PRBCs with hemodialysis on 03/02/2021 -Echocardiogram has been checked to ensure valves are stable -Continue to monitor her mentation status carefully and will order a calorie count and a dietitian consult -she continues to be hypotensive with hemodialysis but midodrine has been added back on dialysis days which is improved  History of seizures, bilateral SDH and VP shunt -He has no headache and a supple neck and she is continue Keppra which she takes at home but because of her poor p.o. intake we have changes to IV -We will continue monitor closely and there is a question if she had a fluid imbalance induced hypotension causing seizure activity at her dialysis unit causing her AMS -Per nephrologist her mental status appears to be close to her baseline given that they have seen the patient several times before  Acute anemia superimposed on anemia of chronic kidney disease -It is unclear if she had a lab error that showed a low hemoglobin hematocrit.  After 2 units of PRBCs her hemoglobin trended up to 11 and is now stable at 13 as above -CT of the abdomen and pelvis showed no retroperitoneal bleed and the echocardiogram of the abdominal aorta was without any major valvular abnormalities and there is no obvious source of ongoing bleeding -Hemoglobin/hematocrit is now stable -Continue monitor for signs and symptoms of bleeding; no overt bleeding noted  GERD -Changed PPI to IV given her poor p.o. intake  Right arm AV Fistula Infiltration -Seen by vascular surgery and they feel that the infiltration of the hematoma on the right arm is not impressive and this would not profoundly  explain her anemia in the plan to rest the fistula for about 2 weeks and dialyze via a right IJ [facility] -Dr. [name] feels that the aVF may have a pseudoaneurysm but this does not need urgent intervention  Severe failure to thrive and deconditioning -PT OT evaluated and recommending SNF next-patient is a very poor p.o. intake  so we have consulted nutritionist and have started a calorie count -Palliative care has been called for goals of care discussion question -SLP has been consulted for evaluation and they have placed her on a dysphagia 2 diet with thin liquid; her diet has been changed to dysphagia diet due to her spitting out food To continue with protein calorie supplements and calorie count -Prevalon boots have been added  ESRD on Tuesday Thursday Saturday  -Nephrology is following and patient got dialyzed through a right IJ dialysis catheter since her right AV fistula has infiltrated and she has been seen by the vascular surgery.  If she has been repaired and they are recommending rest at this time -continue with calcitriol 0.5 mcg every Tuesday Thursday Saturday and continue with calcium acetate 667 p.o. 3 times daily with meals  Proximal atrial fibrillation -Continue with metoprolol for rate control and amiodarone -She is not a candidate for anticoagulation given her intracranial hemorrhage/subarachnoid hemorrhage/anemia  History of right lower extremity DVT -Age is indeterminate and she is not a candidate for anticoagulation due to her subdural bleeds and previous admissions IR was consulted for IVC filter but this was not recommended at that time. -Continue monitor  Hypothyroidism -She was on a beta-blocker and apparently methimazole discontinued last admission That she has a stable TSH and she needs outpatient endocrine follow-up at discharge  Anasarca with ascites and effusion due to severe protein calorie malnutrition and extremely poor nutritional status -She is asymptomatic and she is on supplements  -Nutritionist consulted for further evaluation and she is now getting calorie count  Goals of care -Her baseline quality of life seems to be extremely poor due to her brain injury and extremely poor nutritional status as well as multiple comorbidities including ESRD.  She is extremely poor  functional status and we have consulted palliative care for goals of care consultation and discussion  Hypertension -On nondialysis days her systolics are between [id number] and 140 requiring low-dose beta-blockers however she extremely sensitive to fluid shifts and on the days of dialysis her systolic falls below 80.  She gets midodrine during the dialysis times and nephrology is following closely -Continue monitor blood pressures per protocol neurology is trying to maximize ultrafiltration and titrate down volume as tolerated  Hyperlipidemia -Continue rosuvastatin 10 mg p.o. daily if able to tolerate p.o.  DVT prophylaxis: SCDs Code Status: FULL CODE  Family Communication: No family present at bedside Disposition Plan: Pending further clinical improvement and tolerance of the diet and insurance that she is adequately taking in enough nutrition for safe discharge disposition  Status is: Inpatient  Remains inpatient appropriate because:Unsafe d/c plan, IV treatments appropriate due to intensity of illness or inability to take PO and Inpatient level of care appropriate due to severity of illness   Dispo: The patient is from: Home              Anticipated d/c is to: SNF              Patient currently is not medically stable to d/c.   Difficult to place patient No  Consultants:   Nephrology  Palliative Care Medicine  Procedures: Hemodialysis  Antimicrobials:  Anti-infectives (From admission, onward)   Start     Dose/Rate Route Frequency Ordered Stop   03/02/21 1200  vancomycin (VANCOCIN) IVPB 500 mg/100 ml premix  Status:  Discontinued        500 mg 100 mL/hr over 60 Minutes Intravenous Every T-Th-Sa (Hemodialysis) 02/28/21 1747 03/01/21 0824   03/01/21 1800  ceFEPIme (MAXIPIME) 1 g in sodium chloride 0.9 % 100 mL IVPB  Status:  Discontinued        1 g 200 mL/hr over 30 Minutes Intravenous Every 24 hours 02/28/21 1747 03/01/21 0824   02/28/21 1800  vancomycin (VANCOREADY) IVPB 1000  mg/200 mL        1,000 mg 200 mL/hr over 60 Minutes Intravenous  Once 02/28/21 1716 02/28/21 1957   02/28/21 1730  ceFEPIme (MAXIPIME) 2 g in sodium chloride 0.9 % 100 mL IVPB        2 g 200 mL/hr over 30 Minutes Intravenous  Once 02/28/21 1716 02/28/21 1855   02/28/21 1730  metroNIDAZOLE (FLAGYL) IVPB 500 mg        500 mg 100 mL/hr over 60 Minutes Intravenous  Once 02/28/21 1716 02/28/21 1938        Subjective: Seen and examined at bedside and she was confused but in no acute distress.  No nausea or vomiting.  Denies any lightheadedness.  Her p.o. intake has not been very well per nursing staff.  No other concerns or complaints at this time.  Objective: Vitals:   03/05/21 2200 03/06/21 0523 03/06/21 0600 03/06/21 1500  BP: (!) 140/58 (!) 155/65  (!) 146/66  Pulse:    72  Resp:    16  Temp:  (!) 97.5 F (36.4 C) 98.1 F (36.7 C) 99.2 F (37.3 C)  TempSrc:  Axillary Rectal Axillary  SpO2:    99%  Weight:      Height:        Intake/Output Summary (Last 24 hours) at 03/06/2021 1809 Last data filed at 03/06/2021 1100 Gross per 24 hour  Intake 30 ml  Output --  Net 30 ml   Filed Weights   03/05/21 0500 03/05/21 0827 03/05/21 1159  Weight: 42.3 kg 42 kg 39.9 kg   Examination: Physical Exam:  Constitutional: Thin extremely frail and cachectic African-American female who is confused and in no acute distress  Eyes: Lids and conjunctivae normal, sclerae anicteric  ENMT: External Ears, Nose appear normal. Grossly normal hearing.  Neck: Appears normal, supple, no cervical masses, normal ROM, no appreciable thyromegaly; no JVD Respiratory: Diminished to auscultation bilaterally, no wheezing, rales, rhonchi or crackles. Normal respiratory effort and patient is not tachypenic. No accessory muscle use.  Unlabored breathing Cardiovascular: RRR, no murmurs / rubs / gallops. S1 and S2 auscultated.  No lower extremity edema Abdomen: Soft, non-tender, non-distended.  Bowel sounds  positive.  GU: Deferred. Musculoskeletal: No clubbing / cyanosis of digits/nails. No joint deformity upper and lower extremities.  Skin: No rashes, lesions, ulcers on limited skin evaluation. No induration; Warm and dry.  Neurologic: CN 2-12 grossly intact with no focal deficits. Romberg sign and cerebellar reflexes not assessed.  Psychiatric: Impaired judgment and insight.  She is awake and alert but not fully oriented x 3. Normal mood and appropriate affect.   Data Reviewed: I have personally reviewed following labs and imaging studies  CBC: Recent Labs  Lab 02/28/21 1406 03/01/21 0046 03/03/21 0150 03/03/21 0756 03/03/21 1615 03/04/21 0016 03/05/21 0237 03/06/21 0117  WBC 4.9   < >  8.3 8.8 8.7 7.0  7.1 6.9 5.9  NEUTROABS 3.3  --  6.5  --   --  5.5 5.4 4.5  HGB 7.4*   < > 12.2 11.6* 12.3 12.2  12.1 13.2 13.0  HCT 25.5*   < > 35.8* 33.8* 36.9 37.1  36.8 40.4 40.7  MCV 87.3   < > 81.4 80.9 81.6 83.6  83.6 84.3 87.2  PLT 155   < > 108* 116* 127* 143*  139* 158 PLATELET CLUMPS NOTED ON SMEAR, UNABLE TO ESTIMATE   < > = values in this interval not displayed.   Basic Metabolic Panel: Recent Labs  Lab 03/01/21 0046 03/02/21 0833 03/03/21 0150 03/04/21 0016 03/05/21 0237 03/06/21 0117  NA 135 134* 133* 133* 133* 136  K 3.7 3.6 3.5 3.6 4.0 3.7  CL 96* 96* 96* 95* 93* 100  CO2 '27 29 29 28 29 28  '$ GLUCOSE 93 87 88 94 84 83  BUN 17 22* 15 22* 25* 15  CREATININE 4.12* 4.79* 3.56* 4.49* 5.41* 3.98*  CALCIUM 9.2 9.2 9.3 9.9 10.3 9.6  MG 2.0  --   --   --   --   --   PHOS 5.4* 5.3*  --   --   --   --    GFR: Estimated Creatinine Clearance: 9.7 mL/min (A) (by C-G formula based on SCr of 3.98 mg/dL (H)). Liver Function Tests: Recent Labs  Lab 02/28/21 1406 03/02/21 0833 03/05/21 0237 03/06/21 0117  AST 18  --  12* 15  ALT 8  --  7 7  ALKPHOS 54  --  53 51  BILITOT 0.7  --  0.6 0.6  PROT 4.7*  --  5.0* 4.9*  ALBUMIN 1.5* 1.3* 1.5* 1.5*   No results for input(s):  LIPASE, AMYLASE in the last 168 hours. No results for input(s): AMMONIA in the last 168 hours. Coagulation Profile: Recent Labs  Lab 02/28/21 1541 03/02/21 0947  INR 1.3* 1.1   Cardiac Enzymes: No results for input(s): CKTOTAL, CKMB, CKMBINDEX, TROPONINI in the last 168 hours. BNP (last 3 results) No results for input(s): PROBNP in the last 8760 hours. HbA1C: No results for input(s): HGBA1C in the last 72 hours. CBG: Recent Labs  Lab 02/28/21 2020 02/28/21 2329  GLUCAP 77 97   Lipid Profile: No results for input(s): CHOL, HDL, LDLCALC, TRIG, CHOLHDL, LDLDIRECT in the last 72 hours. Thyroid Function Tests: No results for input(s): TSH, T4TOTAL, FREET4, T3FREE, THYROIDAB in the last 72 hours. Anemia Panel: No results for input(s): VITAMINB12, FOLATE, FERRITIN, TIBC, IRON, RETICCTPCT in the last 72 hours. Sepsis Labs: Recent Labs  Lab 02/28/21 1406 02/28/21 1606  LATICACIDVEN 8.1* 8.7*    Recent Results (from the past 240 hour(s))  Resp Panel by RT-PCR (Flu A&B, Covid) Nasopharyngeal Swab     Status: None   Collection Time: 02/28/21  5:26 PM   Specimen: Nasopharyngeal Swab; Nasopharyngeal(NP) swabs in vial transport medium  Result Value Ref Range Status   SARS Coronavirus 2 by RT PCR NEGATIVE NEGATIVE Final    Comment: (NOTE) SARS-CoV-2 target nucleic acids are NOT DETECTED.  The SARS-CoV-2 RNA is generally detectable in upper respiratory specimens during the acute phase of infection. The lowest concentration of SARS-CoV-2 viral copies this assay can detect is 138 copies/mL. A negative result does not preclude SARS-Cov-2 infection and should not be used as the sole basis for treatment or other patient management decisions. A negative result may occur with  improper specimen collection/handling,  submission of specimen other than nasopharyngeal swab, presence of viral mutation(s) within the areas targeted by this assay, and inadequate number of viral copies(<138  copies/mL). A negative result must be combined with clinical observations, patient history, and epidemiological information. The expected result is Negative.  Fact Sheet for Patients:  EntrepreneurPulse.com.au  Fact Sheet for Healthcare Providers:  IncredibleEmployment.be  This test is no t yet approved or cleared by the [place] FDA and  has been authorized for detection and/or diagnosis of SARS-CoV-2 by FDA under an Emergency Use Authorization (EUA). This EUA will remain  in effect (meaning this test can be used) for the duration of the COVID-19 declaration under Section 564(b)(1) of the Act, 21 U.S.C.section 360bbb-3(b)(1), unless the authorization is terminated  or revoked sooner.       Influenza A by PCR NEGATIVE NEGATIVE Final   Influenza B by PCR NEGATIVE NEGATIVE Final    Comment: (NOTE) The Xpert Xpress SARS-CoV-2/FLU/RSV plus assay is intended as an aid in the diagnosis of influenza from Nasopharyngeal swab specimens and should not be used as a sole basis for treatment. Nasal washings and aspirates are unacceptable for Xpert Xpress SARS-CoV-2/FLU/RSV testing.  Fact Sheet for Patients: EntrepreneurPulse.com.au  Fact Sheet for Healthcare Providers: IncredibleEmployment.be  This test is not yet approved or cleared by the [place] FDA and has been authorized for detection and/or diagnosis of SARS-CoV-2 by FDA under an Emergency Use Authorization (EUA). This EUA will remain in effect (meaning this test can be used) for the duration of the COVID-19 declaration under Section 564(b)(1) of the Act, 21 U.S.C. section 360bbb-3(b)(1), unless the authorization is terminated or revoked.  Performed at [facility], [place] [address], [place], [place] [address]   MRSA PCR Screening     Status: None   Collection Time: 02/28/21  7:40 PM   Specimen: Nasopharyngeal  Result Value Ref Range Status    MRSA by PCR NEGATIVE NEGATIVE Final    Comment:        The GeneXpert MRSA Assay (FDA approved for NASAL specimens only), is one component of a comprehensive MRSA colonization surveillance program. It is not intended to diagnose MRSA infection nor to guide or monitor treatment for MRSA infections. Performed at [facility], [place] [address] [place], [place] [address]   Culture, blood (single)     Status: None   Collection Time: 03/01/21 12:46 AM   Specimen: BLOOD LEFT HAND  Result Value Ref Range Status   Specimen Description BLOOD LEFT HAND  Final   Special Requests   Final    BOTTLES DRAWN AEROBIC AND ANAEROBIC Blood Culture results may not be optimal due to an inadequate volume of blood received in culture bottles   Culture   Final    NO GROWTH 5 DAYS Performed at [facility], [place] [address] [place], [place] [address]    Report Status 03/06/2021 FINAL  Final     RN Pressure Injury Documentation: Pressure Injury 11/29/20 Sacrum Posterior Stage 2 -  Partial thickness loss of dermis presenting as a shallow open injury with a red, pink wound bed without slough. (Active)  11/29/20 0400  Location: Sacrum  Location Orientation: Posterior  Staging: Stage 2 -  Partial thickness loss of dermis presenting as a shallow open injury with a red, pink wound bed without slough.  Wound Description (Comments):   Present on Admission: Yes    Estimated body mass index is 16.63 kg/m as calculated from the following:  Height as of this encounter: 5' 0.98" (1.549 m).   Weight as of this encounter: 39.9 kg.  Malnutrition Type:  Nutrition Problem: Increased nutrient needs Etiology: wound healing,chronic illness (ESRD, underweight status)   Malnutrition Characteristics:  Signs/Symptoms: estimated needs   Nutrition Interventions:  Interventions: Nepro shake,MVI   Radiology Studies: No results found.  Scheduled Meds: . (feeding supplement) PROSource Plus  30 mL Oral  BID BM  . sodium chloride   Intravenous Once  . sodium chloride   Intravenous Once  . amiodarone  200 mg Oral Daily  . calcitRIOL  0.5 mcg Oral Q T,Th,Sa-HD  . calcium acetate  667 mg Oral TID WC  . Chlorhexidine Gluconate Cloth  6 each Topical Q0600  . Chlorhexidine Gluconate Cloth  6 each Topical Q0600  . [START ON 03/07/2021] Chlorhexidine Gluconate Cloth  6 each Topical Q0600  . feeding supplement (NEPRO CARB STEADY)  237 mL Oral TID BM  . metoprolol tartrate  2.5 mg Intravenous Q8H  . midodrine  10 mg Oral Q T,Th,Sa-HD  . midodrine  5 mg Oral BID WC  . multivitamin  1 tablet Oral QHS  . pantoprazole (PROTONIX) IV  40 mg Intravenous Q24H  . rosuvastatin  10 mg Oral Daily   Continuous Infusions: . sodium chloride    . levETIRAcetam 500 mg (03/06/21 1052)    LOS: 6 days   Kerney Elbe, DO [facility] PAGER is on [place]  If 7PM-7AM, please contact night-coverage [email or web address]

## 2021-03-06 NOTE — Care Management Important Message (Signed)
Important Message  Patient Details  Name: [name] MRN: [id number] Date of Birth: [birth year]   Medicare Important Message Given:  Yes   Tried to call the patients room did not get an answer will mail IM to home address.   Divya Munshi 03/06/2021, 3:38 PM

## 2021-03-06 NOTE — Progress Notes (Signed)
  Speech Language Pathology Treatment: Dysphagia  Patient Details Name: [name] MRN: [id number] DOB: 1961-01-06 Today's Date: 03/06/2021 Time: [id number] SLP Time Calculation (min) (ACUTE ONLY): 12 min  Assessment / Plan / Recommendation Clinical Impression  Pt was seen for dysphagia treatment. Eugene Garnet, RN reported that the pt continues to [facility] and then spit out foods despite elimination of auditory and visual distractions and provision of cueing to swallow. Pt's nurse further stated that the pt did very well with soups and some purees on 5/17, but has been refusing supplements. Pt's diet was modified to dysphagia 2 by staff for the aforementioned reasons. Pt demonstrated prolonged oral holding with purees and spitting thereafter. Pt indicated that she did not like applesauce, but enjoys vanilla pudding. She stated that she would like to try this, but similar behaviors were noted with oral holding (though less significant) and spitting. Pt tolerated graham crackers without overt s/sx of aspiration. Mastication and oral clearance were adequate with this consistency. Pt's symptoms are likely cognitively based. SLP questions whether the modification of diet may result in similar behaviors due to pt's potential dislike of some pureed consistencies or the ground textures. However, pt stated (reliability questioned) that she would like to try softer solids. Pt's current diet of dysphagia 2 solids and thin liquids will therefore be maintained, but with allowance of all consistencies if pt desires. SLP will continue to follow pt.    HPI HPI: Pt is a 60 year old female who presented to the ED secondary to AMS, hypotension, bleed from AV fistula. Pt began dialysis and 30 seconds after cannulation, site was noted to be infiltrating into the R chest and R arm. Pt had questionable seizure activity (per HD center staff) and became unresponsive. Dx Acute metabolic encephalopathy. PMHx significant for HTN,  diastolic HF, CAD, PAF, CVA/SAH s/p VP shunt and ESRD. BSE 11/11/20 was WNL; regular texture diet and thin liquids recommended without need for f/u.      SLP Plan  Continue with current plan of care       Recommendations  Diet recommendations: Dysphagia 2 (fine chop);Thin liquid Liquids provided via: Cup;Straw Medication Administration: Crushed with puree Supervision: Staff to assist with self feeding Compensations: Minimize environmental distractions Postural Changes and/or Swallow Maneuvers: Seated upright 90 degrees                Oral Care Recommendations: Oral care BID Follow up Recommendations: None SLP Visit Diagnosis: Dysphagia, unspecified (R13.10) Plan: Continue with current plan of care       Raevin Wierenga I. Hardin Negus, [place], [place] Office number [phone] Pager [phone]                Horton Marshall 03/06/2021, 9:35 AM

## 2021-03-06 NOTE — Progress Notes (Signed)
Pt can't take PO meds. Ok to change some to IV that can be changed. Will change protonix, lopressor '25mg'$  BID>>2.'5mg'$  IV q8, keppra per Dr Alfredia Ferguson.  Onnie Boer, PharmD, BCIDP, AAHIVP, CPP Infectious Disease Pharmacist 03/06/2021 8:40 AM

## 2021-03-06 NOTE — Progress Notes (Signed)
[facility] Progress Note   Subjective:   Patient seen and examined at bedside.  Hypotensive with HD yesterday limiting fluid removal.  No specific complaints.    Objective Vitals:   03/05/21 2000 03/05/21 2200 03/06/21 0523 03/06/21 0600  BP: (!) 146/60 (!) 140/58 (!) 155/65   Pulse:      Resp: 15     Temp: 98.4 F (36.9 C)  (!) 97.5 F (36.4 C) 98.1 F (36.7 C)  TempSrc: Axillary  Axillary Rectal  SpO2: 98%     Weight:      Height:       Physical Exam General:chronically ill appearing female in NAD Heart:RRR, no mrg Lungs: CTAB, no mrg Abdomen:soft, NT, +distended Extremities:1+ hip edema, no calf/pedal edema Dialysis Access: RU AVF +b, R IJ [facility]   Filed Weights   03/05/21 0500 03/05/21 0827 03/05/21 1159  Weight: 42.3 kg 42 kg 39.9 kg    Intake/Output Summary (Last 24 hours) at 03/06/2021 1118 Last data filed at 03/05/2021 1159 Gross per 24 hour  Intake --  Output 2032 ml  Net -2032 ml    Additional Objective Labs: Basic Metabolic Panel: Recent Labs  Lab 03/01/21 0046 03/02/21 0833 03/03/21 0150 03/04/21 0016 03/05/21 0237 03/06/21 0117  NA 135 134*   < > 133* 133* 136  K 3.7 3.6   < > 3.6 4.0 3.7  CL 96* 96*   < > 95* 93* 100  CO2 27 29   < > '28 29 28  '$ GLUCOSE 93 87   < > 94 84 83  BUN 17 22*   < > 22* 25* 15  CREATININE 4.12* 4.79*   < > 4.49* 5.41* 3.98*  CALCIUM 9.2 9.2   < > 9.9 10.3 9.6  PHOS 5.4* 5.3*  --   --   --   --    < > = values in this interval not displayed.   Liver Function Tests: Recent Labs  Lab 02/28/21 1406 03/02/21 0833 03/05/21 0237 03/06/21 0117  AST 18  --  12* 15  ALT 8  --  7 7  ALKPHOS 54  --  53 51  BILITOT 0.7  --  0.6 0.6  PROT 4.7*  --  5.0* 4.9*  ALBUMIN 1.5* 1.3* 1.5* 1.5*   CBC: Recent Labs  Lab 03/03/21 0756 03/03/21 1615 03/04/21 0016 03/05/21 0237 03/06/21 0117  WBC 8.8 8.7 7.0  7.1 6.9 5.9  NEUTROABS  --   --  5.5 5.4 4.5  HGB 11.6* 12.3 12.2  12.1 13.2 13.0  HCT 33.8*  36.9 37.1  36.8 40.4 40.7  MCV 80.9 81.6 83.6  83.6 84.3 87.2  PLT 116* 127* 143*  139* 158 PLATELET CLUMPS NOTED ON SMEAR, UNABLE TO ESTIMATE   Blood Culture    Component Value Date/Time   SDES BLOOD LEFT HAND 03/01/2021 0046   SPECREQUEST  03/01/2021 0046    BOTTLES DRAWN AEROBIC AND ANAEROBIC Blood Culture results may not be optimal due to an inadequate volume of blood received in culture bottles   CULT  03/01/2021 0046    NO GROWTH 5 DAYS Performed at [facility], [place] [address], [place], [place] [address]    REPTSTATUS 03/06/2021 FINAL 03/01/2021 0046   CBG: Recent Labs  Lab 02/28/21 2020 02/28/21 2329  GLUCAP 77 97    Medications: . sodium chloride    . levETIRAcetam 500 mg (03/06/21 1052)   . (feeding supplement) PROSource Plus  30 mL Oral  BID BM  . sodium chloride   Intravenous Once  . sodium chloride   Intravenous Once  . amiodarone  200 mg Oral Daily  . calcitRIOL  0.5 mcg Oral Q T,Th,Sa-HD  . calcium acetate  667 mg Oral TID WC  . Chlorhexidine Gluconate Cloth  6 each Topical Q0600  . Chlorhexidine Gluconate Cloth  6 each Topical Q0600  . feeding supplement (NEPRO CARB STEADY)  237 mL Oral TID BM  . metoprolol tartrate  2.5 mg Intravenous Q8H  . midodrine  10 mg Oral Q T,Th,Sa-HD  . midodrine  5 mg Oral BID WC  . multivitamin  1 tablet Oral QHS  . pantoprazole (PROTONIX) IV  40 mg Intravenous Q24H  . rosuvastatin  10 mg Oral Daily    Dialysis Orders: TTS [facility] 4h 400/500 37.5kg 2/2 bath RUA AVF/ RIJ TDC - sensipar 90 po tiw, on hold from 4/14 - mircera 150 q2 last 4.28 - calcitriol 1.25 ug tiw  Assessment/Plan: 1. Shock- acute,hypotension event very shortly after initiating HD, BP's were in the70'sat HD unit. Sent to ED. Noted drop in hgb in ED, pRBC given. Continues to have hypotension with HD, midodrine added on dialysis days. 2. AMS - 2/2 seizure activity vs hypotension. appears to be back at baseline.  3. R arm  pain/infiltration - Seen by [facility]VS, infiltration noted but not enough bleeding to be cause of drop in Hgb.  Plan to rest AVF x 2wks.  Hb improved now at 13.2 s/p total 3 units since admission.  4. ESRD- on HD TTS.HD tomorrow per regular schedule. 5. Bp/ volume - BP improved and mostly well controlled.  Midodrine BID with extra does pre HD.  Vol overloaded on exam and by CT w/ significant ascites and areas of localized pitting edema. Maximize UF and titrate down volume as tolerated.   6. Seizures- takes keppra 7. Atrial fib- on amio, holding BB w/ low BP's. Not on AC due to prior ICH, hx of anemia 8. Anemia ckd- aranesp dose given on 03/02/21, d/c now due to hgb>12.  Hgb drop from 10 as outpatient on to 7.5 in the ED the same day (5/12), further decline on 5/14 to 6.8.  Now s/p 3units of blood since admit and improved to 13.2.  Last month had drop in HGb as well, stool cards given but not completed, improved with Kirby Funk.  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 cont binders and calcitriol-Sensipar on hold from outpatient. 10. Nutrition - changed to dysphagia diet due to spitting out food.  Continue protein supplements.    Jen Mow, PA-C [facility] 03/06/2021,11:18 AM  LOS: 6 days

## 2021-03-06 NOTE — Progress Notes (Signed)
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      Patient is encephalopathic, therefore history has been obtained from chart review. Per EMS, patient began dialysis and 30 seconds after cannulation, site was noted to be infiltrating into the R chest and R arm, and became unresponsive. EMS was called and began Basic Life support ventilations, applied Tourniquet clamp, transported to hospital where she was found to be hypotensive and in shock; has recieved 3 units of PRBCs during hospital stay as of 5/15    PT Comments    Patient received in bed, lethargic and required totalA of 1-2 for all aspects of bed mobility. Tolerated sitting at EOB longer today, and able to make very basic preferences known- IE, shaking head yes/no and indicating if she wanted apple sauce vs graham crackers. Very inconsistent cue following- did not initiate or attempt LAQs sitting EOB, but was able to perform SAQs once back in bed to allow me to replace SCDs. Orthostatics negative. Needed totalA for balance at EOB due to heavy posterior lean. Left in bed in chair position with all needs met, bed alarm active. Continue to recommend SNF.    Follow Up Recommendations  SNF     Equipment Recommendations  Rolling walker with 5" wheels;3in1 (PT);Hospital bed;Other (comment);Wheelchair (measurements PT);Wheelchair cushion (measurements PT) (hoyer lift and pads)    Recommendations for Other Services       Precautions / Restrictions Precautions Precautions: Fall Restrictions Weight Bearing Restrictions: No    Mobility  Bed Mobility Overal bed mobility: Needs Assistance Bed Mobility: Supine to Sit;Sit to  Supine;Rolling Rolling: Total assist   Supine to sit: Total assist;+2 for physical assistance Sit to supine: Total assist;+2 for physical assistance   General bed mobility comments: totalAx1-2 for all aspects of bed mobility, did take orthostatics which were negative. Heavy posterior lean with no effort to correct    Transfers                 General transfer comment: unable  Ambulation/Gait             General Gait Details: unable   Stairs             Wheelchair Mobility    Modified Rankin (Stroke Patients Only)       Balance Overall balance assessment: Needs assistance Sitting-balance support: Feet supported Sitting balance-Leahy Scale: Zero Sitting balance - Comments: totalA to maintain upright due to posterior lean Postural control: Posterior lean                                  Cognition Arousal/Alertness: Lethargic Behavior During Therapy: Flat affect Overall Cognitive Status: No family/caregiver present to determine baseline cognitive functioning                                 General Comments: minimal interactions but able to shake her head yes/no and make very basic preferences known. Seems to have reduced ability to sequence and process cues, command following very inconsistent      Exercises  General Comments General comments (skin integrity, edema, etc.): orthostatics from supine to sitting negative, unable to get standing BP      Pertinent Vitals/Pain Pain Assessment: Faces Faces Pain Scale: Hurts a little bit Pain Location: grimacing with movement. Pain Descriptors / Indicators: Discomfort;Grimacing Pain Intervention(s): Limited activity within patient's tolerance;Monitored during session;Repositioned    Home Living                      Prior Function            PT Goals (current goals can now be found in the care plan section) Acute Rehab PT Goals Patient Stated Goal: did not  state PT Goal Formulation: With patient Time For Goal Achievement: 03/17/21 Potential to Achieve Goals: Fair Progress towards PT goals: Not progressing toward goals - comment (limited by lethargy)    Frequency    Min 2X/week      PT Plan Equipment recommendations need to be updated    Co-evaluation              AM-PAC PT "6 Clicks" Mobility   Outcome Measure  Help needed turning from your back to your side while in a flat bed without using bedrails?: Total Help needed moving from lying on your back to sitting on the side of a flat bed without using bedrails?: Total Help needed moving to and from a bed to a chair (including a wheelchair)?: Total Help needed standing up from a chair using your arms (e.g., wheelchair or bedside chair)?: Total Help needed to walk in hospital room?: Total Help needed climbing 3-5 steps with a railing? : Total 6 Click Score: 6    End of Session   Activity Tolerance: Patient limited by lethargy Patient left: in bed;with call bell/phone within reach;with bed alarm set (bed in chair position) Nurse Communication: Mobility status PT Visit Diagnosis: Unsteadiness on feet (R26.81);Other abnormalities of gait and mobility (R26.89);Muscle weakness (generalized) (M62.81)     Time: 2876-8115 PT Time Calculation (min) (ACUTE ONLY): 36 min  Charges:  $Therapeutic Activity: 8-22 mins (co-tx with OT)                     Windell Norfolk, DPT, PN1   Supplemental Physical Therapist [place]    Pager [phone] Acute Rehab Office [phone]

## 2021-03-06 NOTE — Progress Notes (Addendum)
Palliative:  Consult received and chart reviewed.  Met patient at bedside - she is unable to discuss goals of care. Denies complaints. Nods head yes when I ask if I can speak to her sister. Shakes head no when I ask if she has seen her daughters recently.   RN reports very poor appetite, refusing some meds.   I attempted to call all family members at all numbers listed - voice mails left at each number except the number disconnected. Hopeful for return call. Will continue attempts to discuss [place] with family.  PMT did meet with family in January of 2022 - at that time family requested full code/full scope measures.   Juel Burrow, DNP, AGNP-C Palliative Medicine Team Team Phone # [phone]  Pager # [phone]  NO CHARGE

## 2021-03-06 NOTE — Progress Notes (Signed)
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      Patient is encephalopathic, therefore history has been obtained from chart review. Per EMS, patient began dialysis and 30 seconds after cannulation, site was noted to be infiltrating into the R chest and R arm, and became unresponsive. EMS was called and began Basic Life support ventilations, applied Tourniquet clamp, transported to hospital where she was found to be hypotensive and in shock; has recieved 3 units of PRBCs during hospital stay as of 5/15   OT comments  Pt with limited progress overall towards acute OT goals. She was able to tolerate EOB position better today than last OT session. She remains to need total assist with all ADLs, total A to sit EOB. Pt with minimal interactions but able to shake her head yes/no and make very basic preferences known. Seems to have reduced ability to sequence and process cues, command following very inconsistent. D/c plan to SNF remains appropriate.    Follow Up Recommendations  SNF    Equipment Recommendations  Other (comment) (defer to next venue)    Recommendations for Other Services      Precautions / Restrictions Precautions Precautions: Fall Restrictions Weight Bearing Restrictions: No       Mobility Bed Mobility Overal bed mobility: Needs Assistance Bed Mobility: Supine to Sit;Sit to Supine;Rolling Rolling: Total assist   Supine to sit: Total assist;+2 for physical assistance Sit to supine: Total assist;+2 for physical assistance   General bed mobility comments: totalAx1-2 for all aspects of bed mobility, did take orthostatics which were negative. Heavy posterior  lean with no effort to correct    Transfers                 General transfer comment: unable, zero static sitting balance    Balance Overall balance assessment: Needs assistance Sitting-balance support: Feet supported Sitting balance-Leahy Scale: Zero Sitting balance - Comments: totalA to maintain upright due to posterior lean Postural control: Posterior lean                                 ADL either performed or assessed with clinical judgement   ADL Overall ADL's : Needs assistance/impaired Eating/Feeding: Total assistance;Sitting Eating/Feeding Details (indicate cue type and reason): able to physically feed self a bite of applesauce and a bite of graham cracker while sitting EOB with max-total A for static sitting balance. Pt held food in mouth a minute or two; multimodal cues for  swallowing but pt ultimately spit all the food out. Grooming: Total assistance Grooming Details (indicate cue type and reason): Pt able to brielfy wipe mouth with washcloth handed to her while witting with total support EOB                               General ADL Comments: Cognition, profound weakness, and poor sitting balance continue to impact assit level with ADLs, Pt remains at total A level with ADLs     Vision       Perception     Praxis  Cognition Arousal/Alertness: Awake/alert;Lethargic (more alert than last OT session) Behavior During Therapy: Flat affect Overall Cognitive Status: No family/caregiver present to determine baseline cognitive functioning                                 General Comments: minimal interactions but able to shake her head yes/no and make very basic preferences known. Seems to have reduced ability to sequence and process cues, command following very inconsistent        Exercises     Shoulder Instructions       General Comments orthostatics from supine to sitting negative, unable to get standing BP     Pertinent Vitals/ Pain       Pain Assessment: Faces Faces Pain Scale: Hurts a little bit Pain Location: grimacing with movement. Pain Descriptors / Indicators: Discomfort;Grimacing Pain Intervention(s): Limited activity within patient's tolerance;Monitored during session;Repositioned  Home Living                                          Prior Functioning/Environment              Frequency  Min 2X/week        Progress Toward Goals  OT Goals(current goals can now be found in the care plan section)  Progress towards OT goals: Progressing toward goals (minimal overall progress,did tolerate EOB much better today)  Acute Rehab OT Goals Patient Stated Goal: did not state OT Goal Formulation: Patient unable to participate in goal setting Time For Goal Achievement: 03/17/21 Potential to Achieve Goals: Fair ADL Goals Pt Will Perform Eating: with min assist;sitting Pt Will Perform Upper Body Bathing: sitting;with mod assist Pt Will Perform Lower Body Bathing: with mod assist;sitting/lateral leans Pt Will Transfer to Toilet: with mod assist;stand pivot transfer;bedside commode Additional ADL Goal #1: Pt will complete bed mobility at min A level to prepare for EOB/OOB ADLs.  Plan Discharge plan remains appropriate    Co-evaluation    PT/OT/SLP Co-Evaluation/Treatment: Yes Reason for Co-Treatment: Complexity of the patient's impairments (multi-system involvement);Necessary to address cognition/behavior during functional activity;For patient/therapist safety   OT goals addressed during session: ADL's and self-care      AM-PAC OT "6 Clicks" Daily Activity     Outcome Measure   Help from another person eating meals?: Total Help from another person taking care of personal grooming?: Total Help from another person toileting, which includes using toliet, bedpan, or urinal?: Total Help from another person bathing (including washing, rinsing, drying)?:  Total Help from another person to put on and taking off regular upper body clothing?: Total Help from another person to put on and taking off regular lower body clothing?: Total 6 Click Score: 6    End of Session    OT Visit Diagnosis: Other abnormalities of gait and mobility (R26.89);Muscle weakness (generalized) (M62.81);Adult, failure to thrive (R62.7);Pain;Cognitive communication deficit (R41.841);Other symptoms and signs involving cognitive function;Feeding difficulties (R63.3)   Activity Tolerance Patient limited by fatigue;Patient limited by lethargy   Patient Left in bed;with call bell/phone within reach;with bed alarm set;with SCD's reapplied;Other (comment) (bed in chair position, heels floating on pillows)   Nurse Communication Other (comment);Precautions;Mobility status (spoke with [place] nurse after session)        [id number] OT Time Calculation (min): 36 min  Charges: OT General Charges $OT Visit: 1 Visit  OT Treatments $Therapeutic Activity: 8-22 mins  Tyrone Schimke, OT Acute Rehabilitation Services Pager: [phone] Office: [place], [place] 03/06/2021, 2:15 PM

## 2021-03-07 DIAGNOSIS — Z992 Dependence on renal dialysis: Secondary | ICD-10-CM | POA: Diagnosis not present

## 2021-03-07 DIAGNOSIS — Z7189 Other specified counseling: Secondary | ICD-10-CM

## 2021-03-07 DIAGNOSIS — R638 Other symptoms and signs concerning food and fluid intake: Secondary | ICD-10-CM | POA: Diagnosis not present

## 2021-03-07 DIAGNOSIS — R627 Adult failure to thrive: Secondary | ICD-10-CM | POA: Diagnosis not present

## 2021-03-07 DIAGNOSIS — R5381 Other malaise: Secondary | ICD-10-CM | POA: Diagnosis not present

## 2021-03-07 DIAGNOSIS — Z515 Encounter for palliative care: Secondary | ICD-10-CM | POA: Diagnosis not present

## 2021-03-07 DIAGNOSIS — N186 End stage renal disease: Secondary | ICD-10-CM | POA: Diagnosis not present

## 2021-03-07 LAB — RENAL FUNCTION PANEL
Albumin: 1.5 g/dL — ABNORMAL LOW (ref 3.5–5.0)
Anion gap: 10 (ref 5–15)
BUN: 23 mg/dL — ABNORMAL HIGH (ref 6–20)
CO2: 27 mmol/L (ref 22–32)
Calcium: 9.6 mg/dL (ref 8.9–10.3)
Chloride: 98 mmol/L (ref 98–111)
Creatinine, Ser: 5.36 mg/dL — ABNORMAL HIGH (ref 0.44–1.00)
GFR, Estimated: 9 mL/min — ABNORMAL LOW (ref >60)
Glucose, Bld: 59 mg/dL — ABNORMAL LOW (ref 70–99)
Phosphorus: 4.4 mg/dL (ref 2.5–4.6)
Potassium: 4.3 mmol/L (ref 3.5–5.1)
Sodium: 135 mmol/L (ref 135–145)

## 2021-03-07 LAB — COMPREHENSIVE METABOLIC PANEL
ALT: 8 U/L (ref 0–44)
AST: 12 U/L — ABNORMAL LOW (ref 15–41)
Albumin: 1.5 g/dL — ABNORMAL LOW (ref 3.5–5.0)
Alkaline Phosphatase: 51 U/L (ref 38–126)
Anion gap: 10 (ref 5–15)
BUN: 22 mg/dL — ABNORMAL HIGH (ref 6–20)
CO2: 27 mmol/L (ref 22–32)
Calcium: 10 mg/dL (ref 8.9–10.3)
Chloride: 99 mmol/L (ref 98–111)
Creatinine, Ser: 4.98 mg/dL — ABNORMAL HIGH (ref 0.44–1.00)
GFR, Estimated: 10 mL/min — ABNORMAL LOW (ref >60)
Glucose, Bld: 65 mg/dL — ABNORMAL LOW (ref 70–99)
Potassium: 4.2 mmol/L (ref 3.5–5.1)
Sodium: 136 mmol/L (ref 135–145)
Total Bilirubin: 0.7 mg/dL (ref 0.3–1.2)
Total Protein: 5 g/dL — ABNORMAL LOW (ref 6.5–8.1)

## 2021-03-07 LAB — CBC WITH DIFFERENTIAL/PLATELET
Abs Immature Granulocytes: 0.02 10*3/uL (ref 0.00–0.07)
Basophils Absolute: 0 10*3/uL (ref 0.0–0.1)
Basophils Relative: 0 %
Eosinophils Absolute: 0 10*3/uL (ref 0.0–0.5)
Eosinophils Relative: 0 %
HCT: 40.1 % (ref 36.0–46.0)
Hemoglobin: 12.9 g/dL (ref 12.0–15.0)
Immature Granulocytes: 0 %
Lymphocytes Relative: 16 %
Lymphs Abs: 1 10*3/uL (ref 0.7–4.0)
MCH: 27.7 pg (ref 26.0–34.0)
MCHC: 32.2 g/dL (ref 30.0–36.0)
MCV: 86.1 fL (ref 80.0–100.0)
Monocytes Absolute: 0.3 10*3/uL (ref 0.1–1.0)
Monocytes Relative: 5 %
Neutro Abs: 4.7 10*3/uL (ref 1.7–7.7)
Neutrophils Relative %: 79 %
Platelets: DECREASED 10*3/uL (ref 150–400)
RBC: 4.66 MIL/uL (ref 3.87–5.11)
RDW: 18.6 % — ABNORMAL HIGH (ref 11.5–15.5)
WBC: 5.9 10*3/uL (ref 4.0–10.5)
nRBC: 0.3 % — ABNORMAL HIGH (ref 0.0–0.2)

## 2021-03-07 LAB — CBC
HCT: 39.2 % (ref 36.0–46.0)
Hemoglobin: 12.3 g/dL (ref 12.0–15.0)
MCH: 27.5 pg (ref 26.0–34.0)
MCHC: 31.4 g/dL (ref 30.0–36.0)
MCV: 87.7 fL (ref 80.0–100.0)
Platelets: 118 10*3/uL — ABNORMAL LOW (ref 150–400)
RBC: 4.47 MIL/uL (ref 3.87–5.11)
RDW: 18.2 % — ABNORMAL HIGH (ref 11.5–15.5)
WBC: 6.3 10*3/uL (ref 4.0–10.5)
nRBC: 0.3 % — ABNORMAL HIGH (ref 0.0–0.2)

## 2021-03-07 LAB — MAGNESIUM: Magnesium: 2.1 mg/dL (ref 1.7–2.4)

## 2021-03-07 LAB — PHOSPHORUS: Phosphorus: 4.2 mg/dL (ref 2.5–4.6)

## 2021-03-07 MED ORDER — SODIUM CHLORIDE 0.9 % IV SOLN: 100.0000 mL | INTRAVENOUS | Status: DC | PRN

## 2021-03-07 MED ORDER — HEPARIN SODIUM (PORCINE) 1000 UNIT/ML DIALYSIS: 1500.0000 [IU] | INTRAMUSCULAR | Status: DC | PRN

## 2021-03-07 MED ORDER — HEPARIN SODIUM (PORCINE) 1000 UNIT/ML DIALYSIS: 1000.0000 [IU] | INTRAMUSCULAR | Status: DC | PRN

## 2021-03-07 MED ORDER — LIDOCAINE-PRILOCAINE 2.5-2.5 % EX CREA: 1.0000 "application " | TOPICAL_CREAM | CUTANEOUS | Status: DC | PRN

## 2021-03-07 MED ORDER — PENTAFLUOROPROP-TETRAFLUOROETH EX AERO: 1.0000 "application " | INHALATION_SPRAY | CUTANEOUS | Status: DC | PRN

## 2021-03-07 MED ORDER — LIDOCAINE HCL (PF) 1 % IJ SOLN: 5.0000 mL | INTRAMUSCULAR | Status: DC | PRN

## 2021-03-07 MED ORDER — HEPARIN SODIUM (PORCINE) 1000 UNIT/ML IJ SOLN
INTRAMUSCULAR | Status: AC
  Filled 2021-03-07: qty 4

## 2021-03-07 MED ORDER — ALTEPLASE 2 MG IJ SOLR: 2.0000 mg | Freq: Once | INTRAMUSCULAR | Status: DC | PRN

## 2021-03-07 MED ORDER — HYDROMORPHONE HCL 1 MG/ML IJ SOLN
1.0000 mg | INTRAMUSCULAR | Status: DC | PRN
  Administered 2021-03-07 – 2021-03-08 (×2): 1 mg via INTRAVENOUS
  Filled 2021-03-07 (×3): qty 1

## 2021-03-07 NOTE — Progress Notes (Signed)
[facility]) Hospital Liaison note:  Notified by Kathie Rhodes, NP of request for [place] services. Will continue to follow for disposition.  Please call with any outpatient palliative questions or concerns.  Thank you, Lorelee Market, LPN [facility] Liaison [phone]

## 2021-03-07 NOTE — Progress Notes (Signed)
PROGRESS NOTE    [name]  [id number] DOB: Nov 25, 1960 DOA: 02/28/2021 PCP: Pcp, No  Brief Narrative:  The patient is a extremely thin cachectic and chronically ill-appearing 60 year old African-American female with a past medical history significant for PAD, history of CVA, history of subarachnoid hemorrhage, history of seizures, history of proximal atrial fibrillation, history of chronic urinary issues, hyperlipidemia, hypertension, ESRD on hemodialysis, COPD as well as other comorbidities who presented to ED from her hemodialysis unit after right arm AV fistula infiltrated and her blood pressure dropped.  She became extremely altered and hemoglobin in the ER returned to 7.4 which is down from 13.  She was admitted to the ICU and got 1 unit PRBCs and seen by vascular surgery stabilized and transferred to [facility] on 03/02/2021 with a hemoglobin 6.9.  Subsequently she received 2 more units and hemoglobin improved and is now stable.  Her p.o. and intake has been extremely marginal and poor.  She has failure to thrive and palliative care has been consulted for goals of care discussion however we cannot get in touch with the patient's family members at all.  Nursing stated that she spit out all her medications today and has had a poor appetite again today.  Calorie count still happening.  Assessment & Plan:   Active Problems:   Hemorrhagic shock (HCC)  Acute metabolic encephalopathy and hemodialysis with hypotension/shock and acute drop in hemoglobin -Initially there was a question of her right arm AV fistula played, she also has a history of underlying seizures and subdural hematoma -Discussed with nephrology and they feel that her mental status is close to being baseline now however they feel that her appetite used to be good but not as good now.  We will obtain a calorie count -She had a hemoglobin of 6.9 initially but this is improved and her last hemoglobin/hematocrit was 13.0/40.7 -A DIC  panel was checked including LDH, haptoglobin and occult stool and a CT of the abdomen pelvis was done to rule out any retroperitoneal loss -She received 1 unit of PRBCs in ICU and she was given 2 more units of PRBCs with hemodialysis on 03/02/2021 -Echocardiogram has been checked to ensure valves are stable -Continue to monitor her mentation status carefully and will order a calorie count and a dietitian consult -she continues to be hypotensive with hemodialysis but midodrine has been added back on dialysis days which is improved -She appears to be at baseline currently  History of seizures, bilateral SDH and VP shunt -He has no headache and a supple neck and she is continue Keppra which she takes at home but because of her poor p.o. intake we have changes to IV -We will continue monitor closely and there is a question if she had a fluid imbalance induced hypotension causing seizure activity at her dialysis unit causing her AMS -Per nephrologist her mental status appears to be close to her baseline given that they have seen the patient several times before  Acute anemia superimposed on anemia of chronic kidney disease -It is unclear if she had a lab error that showed a low hemoglobin hematocrit.  After 2 units of PRBCs her hemoglobin trended up to 11 and is now stable at 13 as above -Patient is Hgb/Hct is now stable 12.3/39.2 -CT of the abdomen and pelvis showed no retroperitoneal bleed and the echocardiogram of the abdominal aorta was without any major valvular abnormalities and there is no obvious source of ongoing bleeding -Hemoglobin/hematocrit is now stable -Continue monitor for  signs and symptoms of bleeding; no overt bleeding noted  GERD -Changed PPI to IV given her poor p.o. intake  Right arm AV Fistula Infiltration -Seen by vascular surgery and they feel that the infiltration of the hematoma on the right arm is not impressive and this would not profoundly explain her anemia in the plan  to rest the fistula for about 2 weeks and dialyze via a right IJ [facility] -Dr. [name] feels that the aVF may have a pseudoaneurysm but this does not need urgent intervention  Severe failure to thrive and deconditioning -PT OT evaluated and recommending SNF next-patient is a very poor p.o. intake so we have consulted nutritionist and have started a calorie count -Palliative care has been called for goals of care discussion question -SLP has been consulted for evaluation and they have placed her on a dysphagia 2 diet with thin liquid; her diet has been changed to dysphagia diet due to her spitting out food and meds -Continue with protein calorie supplements and calorie count -Prevalon boots have been added  ESRD on Tuesday Thursday Saturday  -Nephrology is following and patient got dialyzed through a right IJ dialysis catheter since her right AV fistula has infiltrated and she has been seen by the vascular surgery.  If she has been repaired and they are recommending rest at this time -Patient's BUN/Cr went from 22/4.98 -> 23/5.36 -continue with calcitriol 0.5 mcg every Tuesday Thursday Saturday and continue with calcium acetate 667 p.o. 3 times daily with meals  Proximal Atrial Fibrillation -Continue with Metoprolol for rate control and Amiodarone -She is not a candidate for anticoagulation given her intracranial hemorrhage/subarachnoid hemorrhage/anemia  History of right lower extremity DVT -Age is indeterminate and she is not a candidate for anticoagulation due to her subdural bleeds and previous admissions IR was consulted for IVC filter but this was not recommended at that time. -Continue monitor  Hypothyroidism -She was on a beta-blocker and apparently methimazole discontinued last admission -She has a stable TSH and she needs outpatient endocrine follow-up at discharge  Anasarca with ascites and effusion due to severe protein calorie malnutrition and extremely poor nutritional status -She  is asymptomatic and she is on supplements  -Nutritionist consulted for further evaluation and she is now getting calorie count  Goals of care -Her baseline quality of life seems to be extremely poor due to her brain injury and extremely poor nutritional status as well as multiple comorbidities including ESRD.  She is extremely poor functional status and we have consulted palliative care for goals of care consultation and discussion -Unable to reach family again today   Hypertension -On nondialysis days her systolics are between [id number] and 140 requiring low-dose beta-blockers however she extremely sensitive to fluid shifts and on the days of dialysis her systolic falls below 80.  She gets midodrine during the dialysis times and nephrology is following closely -Continue monitor blood pressures per protocol neurology is trying to maximize ultrafiltration and titrate down volume as tolerated  Hyperlipidemia -Continue Rosuvastatin 10 mg p.o. daily if able to tolerate p.o.  DVT prophylaxis: SCDs Code Status: FULL CODE  Family Communication: No family present at bedside and unsuccessful in reaching them  Disposition Plan: Pending further clinical improvement and tolerance of the diet and insurance that she is adequately taking in enough nutrition for safe discharge disposition  Status is: Inpatient  Remains inpatient appropriate because:Unsafe d/c plan, IV treatments appropriate due to intensity of illness or inability to take PO and Inpatient level of care  appropriate due to severity of illness   Dispo: The patient is from: Home              Anticipated d/c is to: SNF              Patient currently is not medically stable to d/c.   Difficult to place patient No  Consultants:   Nephrology  Palliative Care Medicine    Procedures: Hemodialysis  Antimicrobials:  Anti-infectives (From admission, onward)   Start     Dose/Rate Route Frequency Ordered Stop   03/02/21 1200  vancomycin  (VANCOCIN) IVPB 500 mg/100 ml premix  Status:  Discontinued        500 mg 100 mL/hr over 60 Minutes Intravenous Every T-Th-Sa (Hemodialysis) 02/28/21 1747 03/01/21 0824   03/01/21 1800  ceFEPIme (MAXIPIME) 1 g in sodium chloride 0.9 % 100 mL IVPB  Status:  Discontinued        1 g 200 mL/hr over 30 Minutes Intravenous Every 24 hours 02/28/21 1747 03/01/21 0824   02/28/21 1800  vancomycin (VANCOREADY) IVPB 1000 mg/200 mL        1,000 mg 200 mL/hr over 60 Minutes Intravenous  Once 02/28/21 1716 02/28/21 1957   02/28/21 1730  ceFEPIme (MAXIPIME) 2 g in sodium chloride 0.9 % 100 mL IVPB        2 g 200 mL/hr over 30 Minutes Intravenous  Once 02/28/21 1716 02/28/21 1855   02/28/21 1730  metroNIDAZOLE (FLAGYL) IVPB 500 mg        500 mg 100 mL/hr over 60 Minutes Intravenous  Once 02/28/21 1716 02/28/21 1938        Subjective: Seen and examined at bedside and she was seen in dialysis.  Nursing states that she spit up her medications today.  Appetite's been poor.  She denies any complaint.  No other concerns or plans at this time.  Objective: Vitals:   03/07/21 1030 03/07/21 1100 03/07/21 1130 03/07/21 1215  BP: 92/72 106/71  126/68  Pulse:   76 77  Resp:   16 18  Temp:   98.4 F (36.9 C) 98.1 F (36.7 C)  TempSrc:   Axillary Axillary  SpO2:   98% 98%  Weight:   37.5 kg   Height:        Intake/Output Summary (Last 24 hours) at 03/07/2021 1840 Last data filed at 03/07/2021 1500 Gross per 24 hour  Intake 60 ml  Output 1951 ml  Net -1891 ml   Filed Weights   03/07/21 0429 03/07/21 0742 03/07/21 1130  Weight: 39.5 kg 39.4 kg 37.5 kg   Examination: Physical Exam:  Constitutional: Thin frail and cachectic African-American female who was confused in no acute distress Eyes: Lids and conjunctivae normal, sclerae anicteric  ENMT: External Ears, Nose appear normal. Grossly normal hearing.  Neck: Appears normal, supple, no cervical masses, normal ROM, no appreciable thyromegaly; no  JVD Respiratory: Diminished to auscultation bilaterally, no wheezing, rales, rhonchi or crackles. Normal respiratory effort and patient is not tachypenic. No accessory muscle use. Unlabored breathing  Cardiovascular: RRR, no murmurs / rubs / gallops. S1 and S2 auscultated. No appreciable edema Abdomen: Soft, non-tender, non-distended. Bowel sounds positive.  GU: Deferred. Musculoskeletal: No clubbing / cyanosis of digits/nails. No joint deformity upper and lower extremities.  Skin: No rashes, lesions, ulcers on a limited skin evaluation. No induration; Warm and dry.  Neurologic: CN 2-12 grossly intact with no focal deficits. Romberg sign cerebellar reflexes not assessed.  Psychiatric: Impaired judgment and insight.  She is awake but not Alert and not fully oriented x 3. Normal mood and appropriate affect.   Data Reviewed: I have personally reviewed following labs and imaging studies  CBC: Recent Labs  Lab 03/03/21 0150 03/03/21 0756 03/04/21 0016 03/05/21 0237 03/06/21 0117 03/07/21 0252 03/07/21 0826  WBC 8.3   < > 7.0  7.1 6.9 5.9 5.9 6.3  NEUTROABS 6.5  --  5.5 5.4 4.5 4.7  --   HGB 12.2   < > 12.2  12.1 13.2 13.0 12.9 12.3  HCT 35.8*   < > 37.1  36.8 40.4 40.7 40.1 39.2  MCV 81.4   < > 83.6  83.6 84.3 87.2 86.1 87.7  PLT 108*   < > 143*  139* 158 PLATELET CLUMPS NOTED ON SMEAR, UNABLE TO ESTIMATE PLATELET CLUMPS NOTED ON SMEAR, COUNT APPEARS DECREASED 118*   < > = values in this interval not displayed.   Basic Metabolic Panel: Recent Labs  Lab 03/01/21 0046 03/02/21 [id number] 03/03/21 0150 03/04/21 0016 03/05/21 0237 03/06/21 0117 03/07/21 0252 03/07/21 0816  NA 135 134*   < > 133* 133* 136 136 135  K 3.7 3.6   < > 3.6 4.0 3.7 4.2 4.3  CL 96* 96*   < > 95* 93* 100 99 98  CO2 27 29   < > '28 29 28 27 27  '$ GLUCOSE 93 87   < > 94 84 83 65* 59*  BUN 17 22*   < > 22* 25* 15 22* 23*  CREATININE 4.12* 4.79*   < > 4.49* 5.41* 3.98* 4.98* 5.36*  CALCIUM 9.2 9.2   < > 9.9 10.3  9.6 10.0 9.6  MG 2.0  --   --   --   --   --  2.1  --   PHOS 5.4* 5.3*  --   --   --   --  4.2 4.4   < > = values in this interval not displayed.   GFR: Estimated Creatinine Clearance: 6.8 mL/min (A) (by C-G formula based on SCr of 5.36 mg/dL (H)). Liver Function Tests: Recent Labs  Lab 03/02/21 0833 03/05/21 0237 03/06/21 0117 03/07/21 0252 03/07/21 0816  AST  --  12* 15 12*  --   ALT  --  '7 7 8  '$ --   ALKPHOS  --  53 51 51  --   BILITOT  --  0.6 0.6 0.7  --   PROT  --  5.0* 4.9* 5.0*  --   ALBUMIN 1.3* 1.5* 1.5* 1.5* 1.5*   No results for input(s): LIPASE, AMYLASE in the last 168 hours. No results for input(s): AMMONIA in the last 168 hours. Coagulation Profile: Recent Labs  Lab 03/02/21 0947  INR 1.1   Cardiac Enzymes: No results for input(s): CKTOTAL, CKMB, CKMBINDEX, TROPONINI in the last 168 hours. BNP (last 3 results) No results for input(s): PROBNP in the last 8760 hours. HbA1C: No results for input(s): HGBA1C in the last 72 hours. CBG: Recent Labs  Lab 02/28/21 2020 02/28/21 2329  GLUCAP 77 97   Lipid Profile: No results for input(s): CHOL, HDL, LDLCALC, TRIG, CHOLHDL, LDLDIRECT in the last 72 hours. Thyroid Function Tests: No results for input(s): TSH, T4TOTAL, FREET4, T3FREE, THYROIDAB in the last 72 hours. Anemia Panel: No results for input(s): VITAMINB12, FOLATE, FERRITIN, TIBC, IRON, RETICCTPCT in the last 72 hours. Sepsis Labs: No results for input(s): PROCALCITON, LATICACIDVEN in the last 168 hours.  Recent Results (from the past 240 hour(s))  Resp Panel by RT-PCR (Flu A&B, Covid) Nasopharyngeal Swab     Status: None   Collection Time: 02/28/21  5:26 PM   Specimen: Nasopharyngeal Swab; Nasopharyngeal(NP) swabs in vial transport medium  Result Value Ref Range Status   SARS Coronavirus 2 by RT PCR NEGATIVE NEGATIVE Final    Comment: (NOTE) SARS-CoV-2 target nucleic acids are NOT DETECTED.  The SARS-CoV-2 RNA is generally detectable in upper  respiratory specimens during the acute phase of infection. The lowest concentration of SARS-CoV-2 viral copies this assay can detect is 138 copies/mL. A negative result does not preclude SARS-Cov-2 infection and should not be used as the sole basis for treatment or other patient management decisions. A negative result may occur with  improper specimen collection/handling, submission of specimen other than nasopharyngeal swab, presence of viral mutation(s) within the areas targeted by this assay, and inadequate number of viral copies(<138 copies/mL). A negative result must be combined with clinical observations, patient history, and epidemiological information. The expected result is Negative.  Fact Sheet for Patients:  EntrepreneurPulse.com.au  Fact Sheet for Healthcare Providers:  IncredibleEmployment.be  This test is no t yet approved or cleared by the [place] FDA and  has been authorized for detection and/or diagnosis of SARS-CoV-2 by FDA under an Emergency Use Authorization (EUA). This EUA will remain  in effect (meaning this test can be used) for the duration of the COVID-19 declaration under Section 564(b)(1) of the Act, 21 U.S.C.section 360bbb-3(b)(1), unless the authorization is terminated  or revoked sooner.       Influenza A by PCR NEGATIVE NEGATIVE Final   Influenza B by PCR NEGATIVE NEGATIVE Final    Comment: (NOTE) The Xpert Xpress SARS-CoV-2/FLU/RSV plus assay is intended as an aid in the diagnosis of influenza from Nasopharyngeal swab specimens and should not be used as a sole basis for treatment. Nasal washings and aspirates are unacceptable for Xpert Xpress SARS-CoV-2/FLU/RSV testing.  Fact Sheet for Patients: EntrepreneurPulse.com.au  Fact Sheet for Healthcare Providers: IncredibleEmployment.be  This test is not yet approved or cleared by the [place] FDA and has been  authorized for detection and/or diagnosis of SARS-CoV-2 by FDA under an Emergency Use Authorization (EUA). This EUA will remain in effect (meaning this test can be used) for the duration of the COVID-19 declaration under Section 564(b)(1) of the Act, 21 U.S.C. section 360bbb-3(b)(1), unless the authorization is terminated or revoked.  Performed at [facility], [place] [address] [place], [place] [address]   MRSA PCR Screening     Status: None   Collection Time: 02/28/21  7:40 PM   Specimen: Nasopharyngeal  Result Value Ref Range Status   MRSA by PCR NEGATIVE NEGATIVE Final    Comment:        The GeneXpert MRSA Assay (FDA approved for NASAL specimens only), is one component of a comprehensive MRSA colonization surveillance program. It is not intended to diagnose MRSA infection nor to guide or monitor treatment for MRSA infections. Performed at [facility], [place] [address], [place], [place] [address]   Culture, blood (single)     Status: None   Collection Time: 03/01/21 12:46 AM   Specimen: BLOOD LEFT HAND  Result Value Ref Range Status   Specimen Description BLOOD LEFT HAND  Final   Special Requests   Final    BOTTLES DRAWN AEROBIC AND ANAEROBIC Blood Culture results may not be optimal due to an inadequate volume of blood received in culture bottles   Culture  Final    NO GROWTH 5 DAYS Performed at [facility], [place] [address], [place], [place] [address]    Report Status 03/06/2021 FINAL  Final     RN Pressure Injury Documentation: Pressure Injury 11/29/20 Sacrum Posterior Stage 2 -  Partial thickness loss of dermis presenting as a shallow open injury with a red, pink wound bed without slough. (Active)  11/29/20 0400  Location: Sacrum  Location Orientation: Posterior  Staging: Stage 2 -  Partial thickness loss of dermis presenting as a shallow open injury with a red, pink wound bed without slough.  Wound Description (Comments):   Present on Admission:  Yes    Estimated body mass index is 15.63 kg/m as calculated from the following:   Height as of this encounter: 5' 0.98" (1.549 m).   Weight as of this encounter: 37.5 kg.  Malnutrition Type:  Nutrition Problem: Increased nutrient needs Etiology: wound healing,chronic illness (ESRD, underweight status)   Malnutrition Characteristics:  Signs/Symptoms: estimated needs   Nutrition Interventions:  Interventions: Nepro shake,MVI   Radiology Studies: No results found.  Scheduled Meds: . (feeding supplement) PROSource Plus  30 mL Oral BID BM  . sodium chloride   Intravenous Once  . sodium chloride   Intravenous Once  . amiodarone  200 mg Oral Daily  . calcitRIOL  0.5 mcg Oral Q T,Th,Sa-HD  . calcium acetate  667 mg Oral TID WC  . Chlorhexidine Gluconate Cloth  6 each Topical Q0600  . Chlorhexidine Gluconate Cloth  6 each Topical Q0600  . Chlorhexidine Gluconate Cloth  6 each Topical Q0600  . feeding supplement (NEPRO CARB STEADY)  237 mL Oral TID BM  . metoprolol tartrate  2.5 mg Intravenous Q8H  . midodrine  10 mg Oral Q T,Th,Sa-HD  . midodrine  5 mg Oral BID WC  . multivitamin  1 tablet Oral QHS  . pantoprazole (PROTONIX) IV  40 mg Intravenous Q24H  . rosuvastatin  10 mg Oral Daily   Continuous Infusions: . sodium chloride    . levETIRAcetam 500 mg (03/07/21 1229)    LOS: 7 days   Kerney Elbe, DO [facility] PAGER is on [place]  If 7PM-7AM, please contact night-coverage [email or web address]

## 2021-03-07 NOTE — Progress Notes (Signed)
Notified nurse to place a gauze wrap to patient PIV site to prevent patient from pulling PIV out. Also recommended the use of mitts to prevent dislodging PIV. This patient has extremely limited vasculature. Fran Lowes, RN VAST

## 2021-03-07 NOTE — Progress Notes (Signed)
SLP Cancellation Note  Patient Details Name: [name] MRN: [id number] DOB: 1961/03/08   Cancelled treatment:       Reason Eval/Treat Not Completed: Patient at procedure or test/unavailable. Pt in HD.    Adamaris King, Katherene Ponto 03/07/2021, 8:15 AM

## 2021-03-07 NOTE — Plan of Care (Signed)
  Problem: Self-Concept: Goal: Level of anxiety will decrease Outcome: Progressing   Problem: Nutrition: Goal: Adequate nutrition will be maintained Outcome: Not Progressing

## 2021-03-07 NOTE — Consult Note (Signed)
Consultation Note Date: 03/07/2021   Patient Name: [name]  DOB: 14-May-1961  MRN: [id number]  Age / Sex: 60 y.o., female  PCP: Pcp, No Referring Physician: Kerney Elbe, DO  Reason for Consultation: Establishing goals of care  HPI/Patient Profile: 60 y.o. female  with past medical history of HTN, diastolic HF, CAD (NSTEMI 4098 s/p DES to RCA), PAF (not on [facility]), CVA/SAH s/p VP shunt (2013) and ESRD on HD admitted on 02/28/2021 with AMS. On day of admission patient began dialysis and 30 seconds after cannulation, site was noted to be infiltrating into the R chest and R arm. Patient had questionable seizure activity (per HD center staff) and became unresponsive. Found to be anemic. Patient has received several units of blood. Mental status has improved to baseline. Poor PO intake, very frail. PMT consulted for [place].   Clinical Assessment and Goals of Care: I have reviewed medical records including EPIC notes, labs and imaging, received report from RN, assessed the patient and then met with patient  to discuss diagnosis prognosis, GOC, EOL wishes, disposition and options.  Patient unable to discuss [place]. She nods her head yes and shakes head no however unsure how reliable this is. She nods yes that her daughters and her sister visited today; however, RN reports this is inaccurate. She shakes no she is not in pain. She nods yes to wanting food. She nodded yes that she tolerated HD well. No verbal interaction.  During my time in room she consumed entire magic cup without difficulty. She remained alert throughout visit and appeared comfortable.   Though patient is interactive, she needs surrogate decision maker for further [place].   Her next of kin is her 2 daughters that would be her legal decision makers. Per previous notes, patient's sister reported that daughters have not been in contact for months and she has been making medical decisions for  patient. However, nurse reports that daughter [name] called to check on patient yesterday.  While in room, numbers for daughters that were not listed in chart were found.   [name]: [place]: [phone]  I called both numbers with no answer - voicemails left. I then attempted the numbers in the chart again - no answer, voicemails left. I also attempted to call sister with no answer and left voicemail.  Unfortunately unable to continue [place] conversations.  If able to make contact with family, we would recommend DNR status to family understanding evidenced based poor outcomes in similar hospitalized patients, as the cause of the arrest is likely associated with chronic/terminal disease rather than a reversible acute cardio-pulmonary event.   During most recent palliative visit (Feb 2022) - provider was able to meet with daughter [name] - at that time daughter requested full code, full scope care. This is consistent with other meetings between Ms. Vasconcelos's family and palliative team.   Primary Decision Maker NEXT OF KIN    SUMMARY OF RECOMMENDATIONS    - unable to make contact with family despite MANY attempts and voicemails left - would recommend DNR to family however they have always requested full code/full scope care in previous meetings - will refer to outpatient palliative care  Code Status/Advance Care Planning:  Full code  Discharge Planning: [place] for rehab with Palliative care service follow-up      Primary Diagnoses: Present on Admission: . Hemorrhagic shock ([place])   I have reviewed the medical record, interviewed the patient and family, and examined the patient. The following aspects are pertinent.  Past Medical History:  Diagnosis Date  . Anterior cerebral artery aneurysm    1996 [facility]  . CAD (coronary artery disease)    a. 2009 for NSTEMI with 70% dLAD, 30% mCx, 50% dCx, 95% dRCA, 40% PLA -> RCA was treated with DES.  .  Diastolic heart failure    LVEF 60-65% with grade 2 diastolic dysfunction - July 2014  . Encephalopathy   . ESRD (end stage renal disease) on dialysis ([place])   . HTN (hypertension)    Resistant  . Hypercholesterolemia   . PAF (paroxysmal atrial fibrillation) ([place]) 02/03/2019  . Poor historian   . Seizures ([place])   . 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 Subarachnoid hemorrhage ([place])    03/2012  . Tobacco abuse    Resumed smoking half a pack a day. She was a smoker in the past and states she was abl eto stop in the past using nicotine patches   Social History   Socioeconomic History  . Marital status: Single    Spouse name: Not on file  . Number of children: Not on file  . Years of education: Not on file  . Highest education level: Not on file  Occupational History  . Not on file  Tobacco Use  . Smoking status: Former Smoker    Packs/day: 0.20    Years: 35.00    Pack years: 7.00    Types: Cigarettes    Quit date: 03/20/2017    Years since quitting: 3.9  . Smokeless tobacco: Never Used  . Tobacco comment: 1/2 ppd   Vaping Use  . Vaping Use: Never used  Substance and Sexual Activity  . Alcohol use: No  . Drug use: No  . Sexual activity: Never  Other Topics Concern  . Not on file  Social History Narrative   Lives by herself but helps care for her 8 grandchildren.    Social Determinants of Health   Financial Resource Strain: Medium Risk  . Difficulty of Paying Living Expenses: Somewhat hard  Food Insecurity: No Food Insecurity  . Worried About Charity fundraiser in the Last Year: Never true  . Ran Out of Food in the Last Year: Never true  Transportation Needs: Unmet Transportation Needs  . Lack of Transportation (Medical): Yes  . Lack of Transportation (Non-Medical): Yes  Physical Activity: Not on file  Stress: Not on file  Social Connections: Not on file   Family History  Problem Relation Age of Onset  . Heart disease Mother   . Heart disease Father   . Coronary  artery disease Other        Premature in 1st degree relatives   Scheduled Meds: . (feeding supplement) PROSource Plus  30 mL Oral BID BM  . sodium chloride   Intravenous Once  . sodium chloride   Intravenous Once  . amiodarone  200 mg Oral Daily  . calcitRIOL  0.5 mcg Oral Q T,Th,Sa-HD  . calcium acetate  667 mg Oral TID WC  . Chlorhexidine Gluconate Cloth  6 each Topical Q0600  . Chlorhexidine Gluconate Cloth  6 each Topical Q0600  . Chlorhexidine Gluconate Cloth  6 each Topical Q0600  . feeding supplement (NEPRO CARB STEADY)  237 mL Oral TID BM  . metoprolol tartrate  2.5 mg Intravenous Q8H  . midodrine  10 mg Oral Q T,Th,Sa-HD  . midodrine  5 mg Oral BID WC  . multivitamin  1 tablet Oral QHS  . pantoprazole (PROTONIX) IV  40  mg Intravenous Q24H  . rosuvastatin  10 mg Oral Daily   Continuous Infusions: . sodium chloride    . levETIRAcetam 500 mg (03/07/21 1229)   PRN Meds:.docusate sodium, heparin sodium (porcine), ondansetron (ZOFRAN) IV, phenol, polyethylene glycol Allergies  Allergen Reactions  . Amoxicillin Hives, Rash and Other (See Comments)    Informed by pt's daughter [name] and "Allergic," per MAR  . Penicillins Hives, Rash and Other (See Comments)    "Allergic," per [facility] Has patient had a PCN reaction causing immediate rash, facial/tongue/throat swelling, SOB or lightheadedness with hypotension: Yes Has patient had a PCN reaction causing severe rash involving mucus membranes or skin necrosis: No Has patient had a PCN reaction that required hospitalization: No Has patient had a PCN reaction occurring within the last 10 years: No If all of the above answers are "NO", then may proceed with Cephalosporin use. PATIENT TOLERATED CEFAZOLIN IN OR 1/27/   Review of Systems  Unable to perform ROS: Patient nonverbal    Physical Exam Constitutional:      General: She is not in acute distress. Pulmonary:     Effort: Pulmonary effort is normal.  Skin:     General: Skin is warm and dry.  Neurological:     Mental Status: She is alert. She is disoriented.     Vital Signs: BP 126/68 (BP Location: Left Arm)   Pulse 77   Temp 98.1 F (36.7 C) (Axillary)   Resp 18   Ht 5' 0.98" (1.549 m)   Wt 37.5 kg   SpO2 98%   BMI 15.63 kg/m  Pain Scale: 0-10   Pain Score: 0-No pain   SpO2: SpO2: 98 % O2 Device:SpO2: 98 % O2 Flow Rate: .   IO: Intake/output summary:   Intake/Output Summary (Last 24 hours) at 03/07/2021 1412 Last data filed at 03/07/2021 1130 Gross per 24 hour  Intake --  Output 1951 ml  Net -1951 ml    LBM: Last BM Date: 03/07/21 Baseline Weight: Weight: 42.1 kg Most recent weight: Weight: 37.5 kg     Palliative Assessment/Data: PPS 30%   Flowsheet Rows   Flowsheet Row Most Recent Value  Intake Tab   Referral Department Hospitalist  Unit at Time of Referral Cardiac/Telemetry Unit  Palliative Care Primary Diagnosis Nephrology  Date Notified 03/05/21  Palliative Care Type New Palliative care  Reason for referral Clarify Goals of Care  Date of Admission 02/28/21  Date first seen by Palliative Care 03/06/21  # of days Palliative referral response time 1 Day(s)  # of days IP prior to Palliative referral 5  Clinical Assessment   Psychosocial & Spiritual Assessment   Palliative Care Outcomes       Time Total: 45 minutes Greater than 50%  of this time was spent counseling and coordinating care related to the above assessment and plan.  Juel Burrow, DNP, AGNP-C Palliative Medicine Team [phone] Pager: [phone]

## 2021-03-07 NOTE — Progress Notes (Signed)
[facility] Progress Note   Subjective:   Patient seen and examined at bedside in dialysis.  Tolerating treatment well, no specific complaints.   Objective Vitals:   03/07/21 0930 03/07/21 1000 03/07/21 1030 03/07/21 1100  BP: 135/75 112/71 92/72 106/71  Pulse:      Resp:      Temp:      TempSrc:      SpO2:      Weight:      Height:       Physical Exam General:chronically ill appearing, frail, thin female in NAD Heart:RRR, no mrg Lungs:CTAB anteriorly  Abdomen:soft, NTND Extremities:trace to 1+ edema in hips Dialysis Access: [facility] in use, RU AVF +b/t   Filed Weights   03/05/21 1159 03/07/21 0429 03/07/21 0742  Weight: 39.9 kg 39.5 kg 39.4 kg   No intake or output data in the 24 hours ending 03/07/21 1107  Additional Objective Labs: Basic Metabolic Panel: Recent Labs  Lab 03/02/21 0833 03/03/21 0150 03/06/21 0117 03/07/21 0252 03/07/21 0816  NA 134*   < > 136 136 135  K 3.6   < > 3.7 4.2 4.3  CL 96*   < > 100 99 98  CO2 29   < > '28 27 27  '$ GLUCOSE 87   < > 83 65* 59*  BUN 22*   < > 15 22* 23*  CREATININE 4.79*   < > 3.98* 4.98* 5.36*  CALCIUM 9.2   < > 9.6 10.0 9.6  PHOS 5.3*  --   --  4.2 4.4   < > = values in this interval not displayed.   Liver Function Tests: Recent Labs  Lab 03/05/21 0237 03/06/21 0117 03/07/21 0252 03/07/21 0816  AST 12* 15 12*  --   ALT '7 7 8  '$ --   ALKPHOS 53 51 51  --   BILITOT 0.6 0.6 0.7  --   PROT 5.0* 4.9* 5.0*  --   ALBUMIN 1.5* 1.5* 1.5* 1.5*   CBC: Recent Labs  Lab 03/04/21 0016 03/05/21 0237 03/06/21 0117 03/07/21 0252 03/07/21 0826  WBC 7.0  7.1 6.9 5.9 5.9 6.3  NEUTROABS 5.5 5.4 4.5 4.7  --   HGB 12.2  12.1 13.2 13.0 12.9 12.3  HCT 37.1  36.8 40.4 40.7 40.1 39.2  MCV 83.6  83.6 84.3 87.2 86.1 87.7  PLT 143*  139* 158 PLATELET CLUMPS NOTED ON SMEAR, UNABLE TO ESTIMATE PLATELET CLUMPS NOTED ON SMEAR, COUNT APPEARS DECREASED 118*   CBG: Recent Labs  Lab 02/28/21 2020 02/28/21 2329   GLUCAP 77 97    Medications: . sodium chloride    . sodium chloride    . sodium chloride    . levETIRAcetam 500 mg (03/06/21 2123)   . (feeding supplement) PROSource Plus  30 mL Oral BID BM  . sodium chloride   Intravenous Once  . sodium chloride   Intravenous Once  . amiodarone  200 mg Oral Daily  . calcitRIOL  0.5 mcg Oral Q T,Th,Sa-HD  . calcium acetate  667 mg Oral TID WC  . Chlorhexidine Gluconate Cloth  6 each Topical Q0600  . Chlorhexidine Gluconate Cloth  6 each Topical Q0600  . Chlorhexidine Gluconate Cloth  6 each Topical Q0600  . feeding supplement (NEPRO CARB STEADY)  237 mL Oral TID BM  . metoprolol tartrate  2.5 mg Intravenous Q8H  . midodrine  10 mg Oral Q T,Th,Sa-HD  . midodrine  5 mg Oral BID WC  . multivitamin  1 tablet Oral QHS  . pantoprazole (PROTONIX) IV  40 mg Intravenous Q24H  . rosuvastatin  10 mg Oral Daily    Dialysis Orders: TTS [facility] 4h 400/500 37.5kg 2/2 bath RUA AVF/ RIJ TDC - sensipar 90 po tiw, on hold from 4/14 - mircera 150 q2 last 4.28 - calcitriol 1.25 ug tiw  Assessment/Plan: 1. Shock- acute,hypotension event very shortly after initiating HD, BP's were in the70'sat HD unit. Sent to ED.Noted drop in hgb in ED, pRBC given. Continues to have hypotension with HD, midodrine added on dialysis days. 2. AMS - 2/2 seizure activity vs hypotension. appears to be back at baseline.  3. R arm pain/infiltration- Seen by [facility],infiltration noted but not enough bleeding to be cause of drop in Hgb. Plan to rest AVF x 2wks. Hb improved now at 12.3 s/p total 3 units since admission. 4. ESRD- on HD TTS.HD today per regular schedule.  5. Bp/ volume -BP elevated this AM. Midodrine BID with extra does pre HD - not given today, may be able to d/c if BP remain improved.  Vol overloaded on exam and by CT w/ significant ascites and areas of localized pitting edema.Localized edema improving. Maximize UF and titrate down volume as tolerated.Will  need lower dry weight on d/c.  6. Seizures- takes keppra 7. Atrial fib- on amio, holdingBBw/ low BP's. Not on AC due to prior ICH, hx of anemia 8. Anemia ckd- aranesp dose given on 03/02/21, d/c now due to hgb>12.Hgb drop from 10 as outpatient on to 7.5 in the ED the same day (5/12), further decline on 5/14 to 6.8. Now s/p 3units of blood since admit and improved to 12.3. Last month had drop in HGb as well, stool cards given but not completed, improved with Kirby Funk.  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 10. Nutrition - changed to dysphagia diet due to spitting out food.  Continue protein supplements.   Jen Mow, PA-C [facility] 03/07/2021,11:07 AM  LOS: 7 days

## 2021-03-07 NOTE — Progress Notes (Signed)
Nutrition Follow-up  DOCUMENTATION CODES:   Underweight  INTERVENTION:   -Magic cup TID with meals, each supplement provides 290 kcal and 9 grams of protein -Continue renal MVI daily -Continue 30 ml Prosourve Plus BID, each supplement provides 100 kcals and 15 grams protein -Nepro Shake po TID, each supplement provides 425 kcal and 19 grams protein -Feeding assistance with meals -Initiate 48 hour calorie count per MD request  NUTRITION DIAGNOSIS:   Increased nutrient needs related to wound healing,chronic illness (ESRD, underweight status) as evidenced by estimated needs.  Ongoing  GOAL:   Patient will meet greater than or equal to 90% of their needs  Unmet  MONITOR:   PO intake,Supplement acceptance,Labs,Weight trends,Skin  REASON FOR ASSESSMENT:   Consult Calorie Count,Assessment of nutrition requirement/status  ASSESSMENT:   60 yo female admitted with hypovolemic shock, acute blood loss from AV fistula, AMS, questionable seizure activity at HD center. PMH includes ESRD on HD, HTN, diastolic HF, CAD, PAF, CVA/SAH s/p VP shunt.  5/16- s/p BSE- recommending regular consistency diet with thin liquids 5/18- s/p BSE- downgraded to dysphagia 2 diet with thin liquids  Reviewed I/O's: +30 ml x 24 hours and -92 ml since admission  Pt unavailable at time of visit. Per chart review, pt has been refusing meds and spitting out food.   Pt with poor oral intake. Noted meal completions 15%. Pt is refusing Prosource Plus supplements. MD is requesting calorie count.   Palliative care has been consulted to discuss goals of care.   Medications reviewed and include phoslo.  Labs reviewed.   Diet Order:   Diet Order            DIET DYS 2 Room service appropriate? Yes with Assist; Fluid consistency: Thin  Diet effective now                 EDUCATION NEEDS:   Not appropriate for education at this time  Skin:  Skin Assessment: Skin Integrity Issues: Skin Integrity  Issues:: Stage II Stage II: sacrum  Last BM:  03/07/21  Height:   Ht Readings from Last 1 Encounters:  03/04/21 5' 0.98" (1.549 m)    Weight:   Wt Readings from Last 1 Encounters:  03/07/21 39.4 kg    Ideal Body Weight:  47.7 kg  BMI:  Body mass index is 16.42 kg/m.  Estimated Nutritional Needs:   Kcal:  [id number]  Protein:  65-75 gm  Fluid:  1 L + UOP    Mikaiah Stoffer W, RD, LDN, CDCES Registered Dietitian II Certified Diabetes Care and Education Specialist Please refer to [facility] for RD and/or RD on-call/weekend/after hours pager

## 2021-03-07 NOTE — Progress Notes (Signed)
HD called for report.

## 2021-03-07 NOTE — Plan of Care (Signed)
Patient refused night time oral medicine.  Crushed in chocolate pudding per patient request.  Once administered, patient spit out medication and pudding.

## 2021-03-08 ENCOUNTER — Inpatient Hospital Stay (HOSPITAL_COMMUNITY): Payer: 59

## 2021-03-08 DIAGNOSIS — M7989 Other specified soft tissue disorders: Secondary | ICD-10-CM | POA: Diagnosis not present

## 2021-03-08 DIAGNOSIS — R4182 Altered mental status, unspecified: Secondary | ICD-10-CM | POA: Diagnosis not present

## 2021-03-08 DIAGNOSIS — R638 Other symptoms and signs concerning food and fluid intake: Secondary | ICD-10-CM | POA: Diagnosis not present

## 2021-03-08 DIAGNOSIS — G928 Other toxic encephalopathy: Secondary | ICD-10-CM

## 2021-03-08 DIAGNOSIS — R627 Adult failure to thrive: Secondary | ICD-10-CM | POA: Diagnosis not present

## 2021-03-08 DIAGNOSIS — R5381 Other malaise: Secondary | ICD-10-CM | POA: Diagnosis not present

## 2021-03-08 LAB — CBC WITH DIFFERENTIAL/PLATELET
Abs Immature Granulocytes: 0.02 10*3/uL (ref 0.00–0.07)
Basophils Absolute: 0 10*3/uL (ref 0.0–0.1)
Basophils Relative: 0 %
Eosinophils Absolute: 0 10*3/uL (ref 0.0–0.5)
Eosinophils Relative: 0 %
HCT: 36.9 % (ref 36.0–46.0)
Hemoglobin: 11.9 g/dL — ABNORMAL LOW (ref 12.0–15.0)
Immature Granulocytes: 0 %
Lymphocytes Relative: 19 %
Lymphs Abs: 1 10*3/uL (ref 0.7–4.0)
MCH: 27.9 pg (ref 26.0–34.0)
MCHC: 32.2 g/dL (ref 30.0–36.0)
MCV: 86.4 fL (ref 80.0–100.0)
Monocytes Absolute: 0.3 10*3/uL (ref 0.1–1.0)
Monocytes Relative: 6 %
Neutro Abs: 4 10*3/uL (ref 1.7–7.7)
Neutrophils Relative %: 75 %
Platelets: 85 10*3/uL — ABNORMAL LOW (ref 150–400)
RBC: 4.27 MIL/uL (ref 3.87–5.11)
RDW: 18.2 % — ABNORMAL HIGH (ref 11.5–15.5)
WBC: 5.4 10*3/uL (ref 4.0–10.5)
nRBC: 0 % (ref 0.0–0.2)

## 2021-03-08 LAB — COMPREHENSIVE METABOLIC PANEL
ALT: 8 U/L (ref 0–44)
AST: 12 U/L — ABNORMAL LOW (ref 15–41)
Albumin: 1.5 g/dL — ABNORMAL LOW (ref 3.5–5.0)
Alkaline Phosphatase: 50 U/L (ref 38–126)
Anion gap: 6 (ref 5–15)
BUN: 15 mg/dL (ref 6–20)
CO2: 30 mmol/L (ref 22–32)
Calcium: 9.6 mg/dL (ref 8.9–10.3)
Chloride: 98 mmol/L (ref 98–111)
Creatinine, Ser: 3.67 mg/dL — ABNORMAL HIGH (ref 0.44–1.00)
GFR, Estimated: 14 mL/min — ABNORMAL LOW (ref >60)
Glucose, Bld: 115 mg/dL — ABNORMAL HIGH (ref 70–99)
Potassium: 3.9 mmol/L (ref 3.5–5.1)
Sodium: 134 mmol/L — ABNORMAL LOW (ref 135–145)
Total Bilirubin: 0.4 mg/dL (ref 0.3–1.2)
Total Protein: 5.1 g/dL — ABNORMAL LOW (ref 6.5–8.1)

## 2021-03-08 LAB — GLUCOSE, CAPILLARY
Glucose-Capillary: 107 mg/dL — ABNORMAL HIGH (ref 70–99)
Glucose-Capillary: 107 mg/dL — ABNORMAL HIGH (ref 70–99)

## 2021-03-08 LAB — AMMONIA: Ammonia: 16 umol/L (ref 9–35)

## 2021-03-08 LAB — FOLATE: Folate: 6.7 ng/mL (ref >5.9)

## 2021-03-08 LAB — MAGNESIUM: Magnesium: 2.1 mg/dL (ref 1.7–2.4)

## 2021-03-08 LAB — VITAMIN B12: Vitamin B-12: 774 pg/mL (ref 180–914)

## 2021-03-08 LAB — PHOSPHORUS: Phosphorus: 3.9 mg/dL (ref 2.5–4.6)

## 2021-03-08 LAB — TSH: TSH: 1.635 u[IU]/mL (ref 0.350–4.500)

## 2021-03-08 LAB — VITAMIN D 25 HYDROXY (VIT D DEFICIENCY, FRACTURES): Vit D, 25-Hydroxy: 28.04 ng/mL — ABNORMAL LOW (ref 30–100)

## 2021-03-08 MED ORDER — CHLORHEXIDINE GLUCONATE CLOTH 2 % EX PADS
6.0000 | MEDICATED_PAD | Freq: Every day | CUTANEOUS | Status: DC
  Administered 2021-03-09 – 2021-03-12 (×4): 6 via TOPICAL

## 2021-03-08 MED ORDER — PROSOURCE TF PO LIQD
45.0000 mL | Freq: Every day | ORAL | Status: DC
  Administered 2021-03-08 – 2021-03-11 (×4): 45 mL
  Filled 2021-03-08 (×5): qty 45

## 2021-03-08 MED ORDER — VITAL HIGH PROTEIN PO LIQD: 1000.0000 mL | ORAL | Status: DC

## 2021-03-08 MED ORDER — OSMOLITE 1.5 CAL PO LIQD
1000.0000 mL | ORAL | Status: DC
  Administered 2021-03-08 – 2021-03-11 (×3): 1000 mL
  Filled 2021-03-08 (×6): qty 1000

## 2021-03-08 MED ORDER — SODIUM CHLORIDE 0.9 % IV SOLN
100.0000 mg | Freq: Two times a day (BID) | INTRAVENOUS | Status: DC
  Administered 2021-03-08 – 2021-03-11 (×7): 100 mg via INTRAVENOUS
  Filled 2021-03-08 (×9): qty 100

## 2021-03-08 MED ORDER — RENA-VITE PO TABS
1.0000 | ORAL_TABLET | Freq: Every day | ORAL | Status: DC
  Administered 2021-03-08 – 2021-03-11 (×4): 1
  Filled 2021-03-08 (×4): qty 1

## 2021-03-08 NOTE — Consult Note (Addendum)
NEUROHOSPITALISTS-CONSULTATION NOTE    Requesting Physician: Dr. Alfredia Ferguson   Admit date: 02/28/21    Chief Complaint: Lethargy    History obtained from:  Patient and Chart      HPI   [name] is a 60 year old female w/pmh of  PAD, history of CVA, history of subarachnoid hemorrhage, history of seizures, history of proximal atrial fibrillation, history of chronic urinary issues, hyperlipidemia, hypertension, ESRD on hemodialysis, COPD who initially presented from dialysis with hypotension in the setting of infiltrated right arm AV fistula and questionable seizure activity noted by [facility] staff. Her hospital course has been significant for acute metabolic encephalopathy, anemia requiring three blood transfusions and severe failure to thrive and deconditioning due to poor po intake for which her primary team has consulted palliative care.   Neurology was consulted as she was noted by her primary team to become more withdrawn and somnolent. Per chart review it appears that earlier in admission she was conversational and following commands.   Called patients two daughters and sister to speak to them to discuss her baseline prior to this admission- number listed for Teena Irani is out of service and other daughter/sister did not answer the phone.      Past Medical History    Past Medical History:  Diagnosis Date  . Anterior cerebral artery aneurysm    1996 AT [facility]  . CAD (coronary artery disease)    a. 2009 for NSTEMI with 70% dLAD, 30% mCx, 50% dCx, 95% dRCA, 40% PLA -> RCA was treated with DES.  . Diastolic heart failure    LVEF 60-65% with grade 2 diastolic dysfunction - July 2014  . Encephalopathy   . ESRD (end stage renal disease) on dialysis ([place])   . HTN (hypertension)    Resistant  . Hypercholesterolemia   . PAF (paroxysmal atrial fibrillation) ([place]) 02/03/2019  . Poor historian   . Seizures ([place])   . Stroke (cerebrum) ([place]) 2015  . Subarachnoid hemorrhage  ([place])    03/2012  . Tobacco abuse    Resumed smoking half a pack a day. She was a smoker in the past and states she was abl eto stop in the past using nicotine patches     Past Surgical History   Past Surgical History:  Procedure Laterality Date  . ABDOMINAL HYSTERECTOMY    . BASCILIC VEIN TRANSPOSITION Right 05/13/2013   Procedure: [place];  Surgeon: Rosetta Posner, MD;  Location: [place];  Service: Vascular;  Laterality: Right;  . BRAIN SURGERY     2   . INSERTION OF DIALYSIS CATHETER Right 10/15/2020   Procedure: INSERTION OF TUNNELED DIALYSIS CATHETER;  Surgeon: Cherre Robins, MD;  Location: [facility] OR;  Service: Vascular;  Laterality: Right;  . IR THORACENTESIS ASP PLEURAL SPACE W/IMG GUIDE  11/26/2020  . LOOP RECORDER IMPLANT N/A 11/14/2013   Procedure: LOOP RECORDER IMPLANT;  Surgeon: Evans Lance, MD;  Location[facility] C[facility] LAB;  Service: Cardiovascular;  Laterality: N/A;  . OPEN REDUCTION INTERNAL FIXATION (ORIF) DISTAL RADIAL FRACTURE Left 11/15/2020   Procedure: OPEN REDUCTION INTERNAL FIXATION (ORIF) DISTAL RADIAL FRACTURE;  Surgeon: Verner Mould, MD;  Location: [place];  Service: Orthopedics;  Laterality: Left;  . REVISON OF ARTERIOVENOUS FISTULA Right 10/15/2020   Procedure: REVISON OF ARTERIOVENOUS FISTULA ARM;  Surgeon: Cherre Robins, MD;  Location: [place];  Service: Vascular;  Laterality: Right;  . TEE WITHOUT CARDIOVERSION N/A 11/14/2013   Procedure: TRANSESOPHAGEAL ECHOCARDIOGRAM (TEE);  Surgeon:  Candee Furbish, MD;  Location: [place];  Service: Cardiovascular;  Laterality: N/A;  . VENTRICULOPERITONEAL SHUNT  03/27/2012   Procedure: SHUNT INSERTION VENTRICULAR-PERITONEAL;  Surgeon: Winfield Cunas, MD;  Location: [place] NEURO ORS;  Service: Neurosurgery;  Laterality: N/A;  Ventricular-Peritoneal Shunt Insertion     Family History   Family History  Problem Relation Age of Onset  . Heart disease Mother   . Heart disease Father   . Coronary artery disease  Other        Premature in 1st degree relatives    Social History  Social History:  reports that she quit smoking about 3 years ago. Her smoking use included cigarettes. She has a 7.00 pack-year smoking history. She has never used smokeless tobacco. She reports that she does not drink alcohol and does not use drugs.   Allergies  Allergies:  Allergies  Allergen Reactions  . Amoxicillin Hives, Rash and Other (See Comments)    Informed by pt's daughter [name] and "Allergic," per MAR  . Penicillins Hives, Rash and Other (See Comments)    "Allergic," per [facility] Has patient had a PCN reaction causing immediate rash, facial/tongue/throat swelling, SOB or lightheadedness with hypotension: Yes Has patient had a PCN reaction causing severe rash involving mucus membranes or skin necrosis: No Has patient had a PCN reaction that required hospitalization: No Has patient had a PCN reaction occurring within the last 10 years: No If all of the above answers are "NO", then may proceed with Cephalosporin use. PATIENT TOLERATED CEFAZOLIN IN OR 1/27/     Medications Prior to Admission    I have reviewed the patient's current medications.   ROS  History obtained from chart review  General ROS: negative for - chills, fatigue, fever, night sweats, weight gain or weight loss Psychological ROS: negative for - behavioral disorder, hallucinations, memory difficulties, mood swings or suicidal ideation Ophthalmic ROS: negative for - blurry vision, double vision, eye pain or loss of vision ENT ROS: negative for - epistaxis, nasal discharge, oral lesions, sore throat, tinnitus or vertigo Allergy and Immunology ROS: negative for - hives or itchy/watery eyes Hematological and Lymphatic ROS: negative for - bleeding problems, bruising or swollen lymph nodes Endocrine ROS: negative for - galactorrhea, hair pattern changes, polydipsia/polyuria or temperature intolerance Respiratory ROS: negative for - cough,  hemoptysis, shortness of breath or wheezing Cardiovascular ROS: negative for - chest pain, dyspnea on exertion, edema or irregular heartbeat Gastrointestinal ROS: negative for - abdominal pain, diarrhea, hematemesis, nausea/vomiting or stool incontinence Genito-Urinary ROS: negative for - dysuria, hematuria, incontinence or urinary frequency/urgency Musculoskeletal ROS: negative for - joint swelling or muscular weakness Neurological ROS: as noted in HPI Dermatological ROS: negative for rash and skin lesion changes   Physical Examination          Vitals:    01/10/18 1445 01/10/18 1515 01/10/18 1545 01/10/18 1600  BP: 103/68 (!) 93/58 118/76 (!) 120/101  Pulse: (!) 58 (!) 56 (!) 59 (!) 59  Resp: '17 15 16 14  '$ Temp:          TempSrc:          SpO2: 98% 99% 100% 100%  Weight:          Height:            HEENT-  Normocephalic,  Cardiovascular - Regular rate and rhythm  Respiratory -  Non-labored breathing, No wheezing. Abdomen - soft and non-tender, BS normal Extremities- no edema or cyanosis Skin-warm and dry  Neurological Examination  Blood pressure 132/74, pulse 75, temperature 97.7 F (36.5 C), temperature source Axillary, resp. rate 16, height 5' 0.98" (1.549 m), weight 37.5 kg, SpO2 100 %.  Constitutional: Cachetic AA female laying in bed  Respiratory: Unlabored respirations   Neurological Examination Mental Status: Attentive to examiner and nods to basic questions for example when asked if comfortable in bed. Oriented to self only ( nodded to appropriate last name choice given to her), not oriented to place, situation or time with choices given. Follows command to open/close eyes but can not follow more complex command such as showing two fingers, giving a thumbs up, sticking out her tongue   Speech: She does not speak but nods yes/no to questions. Unable to assess naming and repetition  Cranial Nerves: Tracks examiner, VFF to BTT, Face appears symmetric. Limited CN exam due  to participation  Motor: Decreased bulk and tone with atrophy throughout- upper and lower extremities. Unable to participate in strength testing  Sensory: Appears intact to light touch  Cerebellar: UTA due to limited patient participation  Gait: Deferred patient is bed bound and not mobile    Laboratory Results   Recent Labs  Lab 03/02/21 0833 03/03/21 0150 03/05/21 0237 03/06/21 0117 03/07/21 0252 03/07/21 0816 03/08/21 0253  NA 134*   < > 133* 136 136 135 134*  K 3.6   < > 4.0 3.7 4.2 4.3 3.9  CL 96*   < > 93* 100 99 98 98  CO2 29   < > '29 28 27 27 30  '$ GLUCOSE 87   < > 84 83 65* 59* 115*  BUN 22*   < > 25* 15 22* 23* 15  CREATININE 4.79*   < > 5.41* 3.98* 4.98* 5.36* 3.67*  CALCIUM 9.2   < > 10.3 9.6 10.0 9.6 9.6  MG  --   --   --   --  2.1  --  2.1  PHOS 5.3*  --   --   --  4.2 4.4 3.9   < > = values in this interval not displayed.   Imaging Results   Imaging:  CT Head pending   EEG pending   IMPRESSION AND PLAN  Letita Mkrtchyan is a 60 year old female w/pmh of PAD, history of CVA, history of subarachnoid hemorrhage, history of seizures, history of proximal atrial fibrillation, history of chronic urinary issues, hyperlipidemia, hypertension, ESRD on hemodialysis, COPD who presents from dialysis with hypotension/questionable seizure activity whom neurology has been consulted for increased lethargy/somnolence.   On physical examination today she is not interactive with the examiner, does not speak and nods yes or no to questions, she is only oriented to self and followed one command to open/close eyes.   Per chart review of physical examinations she was conversational/following commands on the 12th and 13th however starting the 14th physical examination declined with her being awake but not speaking as much.   Increased lethargy/somnolence could be multifactorial in the setting of poor oral intake/failure to thrive, seizure, infectious/metabolic encephalopathy. Can also  consider stroke given she has atrial fibrillation and is not on anticoagulation at this time.   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   Ruta Hinds, NP  [facility]   ADDENDUM EEG with no clear source-suggestive of cortical dysfunction arising from left frontotemporal region likely secondary to underlying structural abnormality which was visualized on the CT head.  Patient discussed with attending physician Dr. Rory Percy   Attending Neurohospitalist Addendum Patient seen and examined with APP/Resident. Agree with the history and physical as documented above. Agree with the plan as documented, which I helped formulate. I have independently reviewed the chart, obtained history, review of systems and examined the patient.I have personally reviewed pertinent head/neck/spine imaging (CT/MRI).  Please feel free to call with any questions. -- Amie Portland, MD Neurologist [facility] Pager: [phone]

## 2021-03-08 NOTE — Progress Notes (Signed)
Nutrition Follow-up  DOCUMENTATION CODES:   Underweight,Severe malnutrition in context of chronic illness  INTERVENTION:   -D/c Prosource Plus -D/c Nepro shake -Continue renal MVI (via tube) -Continue feeding assistance with meals -Continue Magic cup TID with meals, each supplement provides 290 kcal and 9 grams of protein -D/c calorie count  -Initiate Osmolite 1.5 @ 25 ml/hr via cortrak tube and increase by 10 ml every 12 hours to goal rate of 25 ml/hr.   45 ml Prosource TF daily    Tube feeding regimen provides 1660 kcal (100% of needs), 79 grams of protein, and 823 ml of H2O.   -Monitor Mg, K, and Phos daily and replete as needed secondary to high refeeding risk  NUTRITION DIAGNOSIS:   Severe Malnutrition related to chronic illness (ESRD on HD) as evidenced by moderate fat depletion,severe fat depletion,moderate muscle depletion,severe muscle depletion.  Ongoing  GOAL:   Patient will meet greater than or equal to 90% of their needs  Unmet  MONITOR:   PO intake,Supplement acceptance,Labs,Weight trends,TF tolerance,Skin,I & O's  REASON FOR ASSESSMENT:   Consult Calorie Count,Assessment of nutrition requirement/status  ASSESSMENT:   60 yo female admitted with hypovolemic shock, acute blood loss from AV fistula, AMS, questionable seizure activity at HD center. PMH includes ESRD on HD, HTN, diastolic HF, CAD, PAF, CVA/SAH s/p VP shunt.  5/16- s/p BSE- recommending regular consistency diet with thin liquids 5/18- s/p BSE- downgraded to dysphagia 2 diet with thin liquids 5/20- cortrak placed (gastric), TF initiated   Reviewed I/O's: -1.5 L x 24 hours and -1.6 L since admission  Pt very weak and lethargic at time of visit. Noted meal tray on tray table, which was untouched. Meal completions very poor; PO: 10-15%. Pt has refused all oral medications today per MAR. Per RN notes, pt continues to spit out meds, even when crushed in preferred pudding flavor.   RD  re-examined pt due to continued decline. Given malnutrition and ongoing oral intake, recommend enteral nutrition support. Case discussed with MD, who reports that MD and palliative care team have been unable to reach family for goals of care discussions. MD agreeable to cortrak to see if pt is able to improve. MD shares that pt with poor prognosis.   5/19-5/20 Breakfast: 18 kcals, 0 grams protein Lunch: 311 kcas, 10 grams protein Dinner: 145 kcals, 5 grams protein  Total intake: 474 kcal (32% of minimum estimated needs)  15 grams protein (20% of minimum estimated needs)  Labs reviewed: Na: 134, K, Mg, and Phos WDL. CBGS: 107.   NUTRITION - FOCUSED PHYSICAL EXAM:  Flowsheet Row Most Recent Value  Orbital Region Moderate depletion  Upper Arm Region Severe depletion  Thoracic and Lumbar Region Severe depletion  Buccal Region Moderate depletion  Temple Region Moderate depletion  Clavicle Bone Region Severe depletion  Clavicle and Acromion Bone Region Severe depletion  Scapular Bone Region Severe depletion  Dorsal Hand Moderate depletion  Patellar Region Severe depletion  Anterior Thigh Region Severe depletion  Posterior Calf Region Severe depletion  Edema (RD Assessment) None  Hair Reviewed  Eyes Reviewed  Mouth Reviewed  Skin Reviewed  Nails Reviewed       Diet Order:   Diet Order            DIET DYS 2 Room service appropriate? Yes with Assist; Fluid consistency: Thin  Diet effective now                 EDUCATION NEEDS:   Not appropriate for  education at this time  Skin:  Skin Assessment: Skin Integrity Issues: Skin Integrity Issues:: Stage II Stage II: sacrum  Last BM:  03/07/21  Height:   Ht Readings from Last 1 Encounters:  03/04/21 5' 0.98" (1.549 m)    Weight:   Wt Readings from Last 1 Encounters:  03/07/21 37.5 kg    Ideal Body Weight:  47.7 kg  BMI:  Body mass index is 15.63 kg/m.  Estimated Nutritional Needs:   Kcal:   1500-1700  Protein:  75-95 grams  Fluid:  1000 ml + UOP    Loistine Chance, RD, LDN, CDCES Registered Dietitian II Certified Diabetes Care and Education Specialist Please refer to [facility] for RD and/or RD on-call/weekend/after hours pager

## 2021-03-08 NOTE — Procedures (Signed)
Patient Name: [name]  MRN: [id number]  Epilepsy Attending: Lora Havens  Referring Physician/Provider: Dr Carlisle Cater Date: 03/08/2021 Duration: 23.45 mins  Patient history: 60yo F with ams. EEG to evaluate for seizur  Level of alertness: Awake, asleep  AEDs during EEG study: LEV  Technical aspects: This EEG study was done with scalp electrodes positioned according to the 10-20 International system of electrode placement. Electrical activity was acquired at a sampling rate of '500Hz'$  and reviewed with a high frequency filter of '70Hz'$  and a low frequency filter of '1Hz'$ . EEG data were recorded continuously and digitally stored.   Description: The posterior dominant rhythm consists of 7.5 Hz activity of moderate voltage (25-35 uV) seen predominantly in posterior head regions, symmetric and reactive to eye opening and eye closing. Sleep was characterized by vertex waves, sleep spindles (12 to 14 Hz), maximal frontocentral region.  EEG showed continuous generalized and maximal left frontotemporal region theta-delta slowing.  Hyperventilation and photic stimulation were not performed.     ABNORMALITY - Continuous slow, generalized and maximal left frontotemporal region  IMPRESSION: This study is suggestive of cortical dysfunction arising from left fronto-temporal region likely secondary to underlying structural abnormality as well as mild diffuse encephalopathy, nonspecific etiology. No seizures or epileptiform discharges were seen throughout the recording.   [name] Barbra Sarks

## 2021-03-08 NOTE — Procedures (Signed)
Cortrak  Person Inserting Tube:  [name], RD Tube Type:  Cortrak - 43 inches Tube Location:  Left nare Initial Placement:  Stomach Secured by: Bridle Technique Used to Measure Tube Placement:  Documented cm marking at nare/ corner of mouth Cortrak Secured At:  67 cm    Cortrak Tube Team Note:  Consult received to place a Cortrak feeding tube.   X-ray is required, abdominal x-ray has been ordered by the Cortrak team. Please confirm tube placement before using the Cortrak tube.   If the tube becomes dislodged please keep the tube and contact the Cortrak team at [email or web address] ([email or web address]) for replacement.  If after hours and replacement cannot be delayed, place a NG tube and confirm placement with an abdominal x-ray.    Kerman Passey MS, RDN, LDN, CNSC Registered Dietitian III Clinical Nutrition RD Pager and On-Call Pager Number Located in [place]

## 2021-03-08 NOTE — Progress Notes (Signed)
Right upper extremity venous duplex has been completed. Preliminary results can be found in CV Proc through chart review.   03/08/21 12:29 PM [name] RVT

## 2021-03-08 NOTE — Progress Notes (Addendum)
SLP Cancellation Note  Patient Details Name: [name] MRN: [id number] DOB: 05-12-1961   Cancelled treatment:       Reason Eval/Treat Not Completed: Patient at procedure or test/unavailable (Pt was approached for treatment but with was having EEG completed. SLP will follow up on a subsequent date.)  Damiano Stamper I. Hardin Negus, [place], [place] Office number [phone] Pager [phone]  Horton Marshall 03/08/2021, 3:47 PM

## 2021-03-08 NOTE — Progress Notes (Signed)
PROGRESS NOTE    Znya Elk  [id number] DOB: 1960-12-29 DOA: 02/28/2021 PCP: Pcp, No  Brief Narrative:  The patient is a extremely thin cachectic and chronically ill-appearing 60 year old African-American female with a past medical history significant for PAD, history of CVA, history of subarachnoid hemorrhage, history of seizures, history of proximal atrial fibrillation, history of chronic urinary issues, hyperlipidemia, hypertension, ESRD on hemodialysis, COPD as well as other comorbidities who presented to ED from her hemodialysis unit after right arm AV fistula infiltrated and her blood pressure dropped.  She became extremely altered and hemoglobin in the ER returned to 7.4 which is down from 13.  She was admitted to the ICU and got 1 unit PRBCs and seen by vascular surgery stabilized and transferred to [facility] on 03/02/2021 with a hemoglobin 6.9.  Subsequently she received 2 more units and hemoglobin improved and is now stable.  Her p.o. and intake has been extremely marginal and poor.  She has failure to thrive and palliative care has been consulted for goals of care discussion however we cannot get in touch with the patient's family members at all.  Nursing stated that she spit out all her medications today and has had a poor appetite again today.  Calorie count still and after discussion with the nutritionist she is unlikely to meet her nutritional needs so we will go ahead and place a Cortrak for tube feeding purposes and reevaluate Monday if her nutritional status is any better.  Her mentation continues to wax and wane and she is not as awake today  Assessment & Plan:   Active Problems:   Hemorrhagic shock (HCC)  Acute metabolic encephalopathy and hemodialysis with hypotension/shock and acute drop in hemoglobin -Initially there was a question of her right arm AV fistula played, she also has a history of underlying seizures and subdural hematoma -She is more somnolent and withdrawn today  and nephrology does not feel that she is at her baseline.  She continues have extremely poor p.o. intake and her calorie count reveals that she is not meeting her nutritional needs so we will place a core track for tube feedings -may discuss with neurology about her mental status -She had a hemoglobin of 6.9 initially but this is improved and her last hemoglobin/hematocrit was 13.0/40.7 -A DIC panel was checked including LDH, haptoglobin and occult stool and a CT of the abdomen pelvis was done to rule out any retroperitoneal loss -She received 1 unit of PRBCs in ICU and she was given 2 more units of PRBCs with hemodialysis on 03/02/2021 -Echocardiogram has been checked to ensure valves are stable -Continue to monitor her mentation status carefully and will order a calorie count and a dietitian consult -she continues to be hypotensive with hemodialysis but midodrine has been added back on dialysis days which is improved -Will Check EEG today and Last Head CT was 02/28/21 and showed "Stable postoperative changes. Stable position of ventriculoperitoneal shunt with tip posterior to the third ventricle in the left midbrain region. Stable ventricular enlargement. Multiple prior infarcts, largest in the left parietal lobe region. No acute infarct evident. No mass, hemorrhage, or appreciable extra-axial fluid collections. Multiple foci of arterial vascular calcification evident."  -Will repeat head CT and if necessary will obtain an MRI -She was at baseline yesterday but today she is a little bit more withdrawn and somnolent today so we will discuss with Neurology.  History of seizures, bilateral SDH and VP shunt -He has no headache and a supple neck and  she is continue Keppra which she takes at home but because of her poor p.o. intake we have changes to IV -We will continue monitor closely and there is a question if she had a fluid imbalance induced hypotension causing seizure activity at her dialysis unit  causing her AMS -Check EEG -She is not at baseline given that she is more withdrawan so will discuss with Neurology and have them formally consult   Acute anemia superimposed on anemia of chronic kidney disease -It is unclear if she had a lab error that showed a low hemoglobin hematocrit.  After 2 units of PRBCs her hemoglobin trended up to 11 and is now stable at 13 as above -Patient is Hgb/Hct is now slowly dropping and gone from 13.0/40.7 -> 12.9/40.1 ->12.3/39.2 -> 11.9/36.9 -CT of the abdomen and pelvis showed no retroperitoneal bleed and the echocardiogram of the abdominal aorta was without any major valvular abnormalities and there is no obvious source of ongoing bleeding -Hemoglobin/hematocrit is now stable -Continue monitor for signs and symptoms of bleeding; no overt bleeding noted  GERD -Changed PPI to IV given her poor p.o. intake  Right arm AV Fistula Infiltration -Seen by vascular surgery and they feel that the infiltration of the hematoma on the right arm is not impressive and this would not profoundly explain her anemia in the plan to rest the fistula for about 2 weeks and dialyze via a right IJ [facility] -Dr. [name] feels that the aVF may have a pseudoaneurysm but this does not need urgent intervention -Right Upper Extremity Duplex negative for DVT  Severe failure to thrive and deconditioning -PT OT evaluated and recommending SNF next-patient is a very poor p.o. intake so we have consulted nutritionist and have started a calorie count -Palliative care has been called for goals of care discussion question -SLP has been consulted for evaluation and they have placed her on a dysphagia 2 diet with thin liquid; her diet has been changed to dysphagia diet due to her spitting out food and meds -Continue with protein calorie supplements and calorie count; Patient failed Calorie Count so will place Cortrak and start TF -Prevalon boots have been added  ESRD on Tuesday Thursday Saturday   -Nephrology is following and patient got dialyzed through a right IJ dialysis catheter since her right AV fistula has infiltrated and she has been seen by the vascular surgery.  If she has been repaired and they are recommending rest at this time -Patient's BUN/Cr went from 22/4.98 -> 23/5.36 -> 15/3.67 -continue with calcitriol 0.5 mcg every Tuesday Thursday Saturday and continue with calcium acetate 667 p.o. 3 times daily with meals  -Continue to Monitor and Trend Renal Fxn carefully -Repeat CMP in the AM   Proximal Atrial Fibrillation -Continue with Metoprolol for rate control and Amiodarone -She is not a candidate for anticoagulation given her intracranial hemorrhage/subarachnoid hemorrhage/anemia  History of right lower extremity DVT -Age is indeterminate and she is not a candidate for anticoagulation due to her subdural bleeds and previous admissions IR was consulted for IVC filter but this was not recommended at that time. -Continue monitor  Hypothyroidism -She was on a beta-blocker and apparently methimazole discontinued last admission -She has a stable TSH and she needs outpatient endocrine follow-up at discharge  Anasarca with ascites and effusion due to severe protein calorie malnutrition and extremely poor nutritional status -She is asymptomatic and she is on supplements  -Nutritionist consulted for further evaluation and she is now getting calorie count and because she  did not pass will get a Cortrak  Goals of care -Her baseline quality of life seems to be extremely poor due to her brain injury and extremely poor nutritional status as well as multiple comorbidities including ESRD.  She is extremely poor functional status and we have consulted palliative care for goals of care consultation and discussion -Unable to reach family again today and Palliative recommending outpatient Palliative now -Will place Cortrak given inadequate Nutritional Intake and will need to Re-evaluate  with GOC discussion once able to get in touch with family    Hypertension -On nondialysis days her systolics are between [id number] and 140 requiring low-dose beta-blockers however she extremely sensitive to fluid shifts and on the days of dialysis her systolic falls below 80.  She gets midodrine during the dialysis times and nephrology is following closely -Continue monitor blood pressures per protocol neurology is trying to maximize ultrafiltration and titrate down volume as tolerated -Last BP was 132/74  Hyperlipidemia -Continue Rosuvastatin 10 mg p.o. daily if able to tolerate p.o.  DVT prophylaxis: SCDs Code Status: FULL CODE  Family Communication: No family present at bedside  Disposition Plan: Pending further clinical improvement and tolerance of the diet and insurance that she is adequately taking in enough nutrition for safe discharge disposition  Status is: Inpatient  Remains inpatient appropriate because:Unsafe d/c plan, IV treatments appropriate due to intensity of illness or inability to take PO and Inpatient level of care appropriate due to severity of illness   Dispo: The patient is from: Home              Anticipated d/c is to: SNF              Patient currently is not medically stable to d/c.   Difficult to place patient No  Consultants:   Nephrology  Palliative Care Medicine   Neurology    Procedures: Hemodialysis  Antimicrobials:  Anti-infectives (From admission, onward)   Start     Dose/Rate Route Frequency Ordered Stop   03/02/21 1200  vancomycin (VANCOCIN) IVPB 500 mg/100 ml premix  Status:  Discontinued        500 mg 100 mL/hr over 60 Minutes Intravenous Every T-Th-Sa (Hemodialysis) 02/28/21 1747 03/01/21 0824   03/01/21 1800  ceFEPIme (MAXIPIME) 1 g in sodium chloride 0.9 % 100 mL IVPB  Status:  Discontinued        1 g 200 mL/hr over 30 Minutes Intravenous Every 24 hours 02/28/21 1747 03/01/21 0824   02/28/21 1800  vancomycin (VANCOREADY) IVPB 1000 mg/200  mL        1,000 mg 200 mL/hr over 60 Minutes Intravenous  Once 02/28/21 1716 02/28/21 1957   02/28/21 1730  ceFEPIme (MAXIPIME) 2 g in sodium chloride 0.9 % 100 mL IVPB        2 g 200 mL/hr over 30 Minutes Intravenous  Once 02/28/21 1716 02/28/21 1855   02/28/21 1730  metroNIDAZOLE (FLAGYL) IVPB 500 mg        500 mg 100 mL/hr over 60 Minutes Intravenous  Once 02/28/21 1716 02/28/21 1938        Subjective: Seen and examined at bedside and he was little bit more withdrawn and somnolent today.  Briefly answered questions appropriately.  But they get a right upper extremity ultrasound.  No overnight complaints but her oral intake remains extremely poor and case was discussed with nutritionist.  We will place a Cortrak and initiate tube feedings. DG Abd X-Ray is currently pending. No other concerns  or complaints at this time.   Objective: Vitals:   03/07/21 1215 03/07/21 2049 03/08/21 0454 03/08/21 1203  BP: 126/68 (!) 148/75 118/60 132/74  Pulse: 77 80 70 75  Resp: 18 (!) '21 15 16  '$ Temp: 98.1 F (36.7 C) 98.4 F (36.9 C) 98.2 F (36.8 C) 97.7 F (36.5 C)  TempSrc: Axillary Axillary Axillary Axillary  SpO2: 98% 98% 95% 100%  Weight:      Height:        Intake/Output Summary (Last 24 hours) at 03/08/2021 1454 Last data filed at 03/08/2021 0850 Gross per 24 hour  Intake 389.18 ml  Output --  Net 389.18 ml   Filed Weights   03/07/21 0429 03/07/21 0742 03/07/21 1130  Weight: 39.5 kg 39.4 kg 37.5 kg   Examination: Physical Exam:  Constitutional: The patient is a thin frail and cachectic African-American female who was a little bit more somnolent and withdrawn and still confused but in no acute distress Eyes: Lids and conjunctivae normal, sclerae anicteric  ENMT: External Ears, Nose appear normal. Grossly normal hearing. Neck: Appears normal, supple, no cervical masses, normal ROM, no appreciable thyromegaly; no JVD Respiratory: Diminished to auscultation bilaterally with coarse  breath sounds, no wheezing, rales, rhonchi or crackles. Normal respiratory effort and patient is not tachypenic. No accessory muscle use.  Unlabored breathing Cardiovascular: RRR, no murmurs / rubs / gallops. S1 and S2 auscultated. No extremity edema.  Abdomen: Soft, non-tender, non-distended. Bowel sounds positive.  GU: Deferred. Musculoskeletal: No clubbing / cyanosis of digits/nails. No joint deformity upper and lower extremities.  Skin: No rashes, lesions, ulcers on limited skin evaluation. No induration; Warm and dry.  Neurologic: CN 2-12 grossly intact with no focal deficits. Romberg sign and cerebellar reflexes not assessed.  Psychiatric: Impaired judgment and insight.  She is a bit more somnolent and withdrawn and still remains confused and is not fully oriented. Withdrawn mood and appropriate affect.   Data Reviewed: I have personally reviewed following labs and imaging studies  CBC: Recent Labs  Lab 03/04/21 0016 03/05/21 0237 03/06/21 0117 03/07/21 0252 03/07/21 0826 03/08/21 0253  WBC 7.0  7.1 6.9 5.9 5.9 6.3 5.4  NEUTROABS 5.5 5.4 4.5 4.7  --  4.0  HGB 12.2  12.1 13.2 13.0 12.9 12.3 11.9*  HCT 37.1  36.8 40.4 40.7 40.1 39.2 36.9  MCV 83.6  83.6 84.3 87.2 86.1 87.7 86.4  PLT 143*  139* 158 PLATELET CLUMPS NOTED ON SMEAR, UNABLE TO ESTIMATE PLATELET CLUMPS NOTED ON SMEAR, COUNT APPEARS DECREASED 118* 85*   Basic Metabolic Panel: Recent Labs  Lab 03/02/21 0833 03/03/21 0150 03/05/21 0237 03/06/21 0117 03/07/21 0252 03/07/21 0816 03/08/21 0253  NA 134*   < > 133* 136 136 135 134*  K 3.6   < > 4.0 3.7 4.2 4.3 3.9  CL 96*   < > 93* 100 99 98 98  CO2 29   < > '29 28 27 27 30  '$ GLUCOSE 87   < > 84 83 65* 59* 115*  BUN 22*   < > 25* 15 22* 23* 15  CREATININE 4.79*   < > 5.41* 3.98* 4.98* 5.36* 3.67*  CALCIUM 9.2   < > 10.3 9.6 10.0 9.6 9.6  MG  --   --   --   --  2.1  --  2.1  PHOS 5.3*  --   --   --  4.2 4.4 3.9   < > = values in this interval not displayed.  GFR: Estimated Creatinine Clearance: 9.9 mL/min (A) (by C-G formula based on SCr of 3.67 mg/dL (H)). Liver Function Tests: Recent Labs  Lab 03/05/21 0237 03/06/21 0117 03/07/21 0252 03/07/21 0816 03/08/21 0253  AST 12* 15 12*  --  12*  ALT '7 7 8  '$ --  8  ALKPHOS 53 51 51  --  50  BILITOT 0.6 0.6 0.7  --  0.4  PROT 5.0* 4.9* 5.0*  --  5.1*  ALBUMIN 1.5* 1.5* 1.5* 1.5* 1.5*   No results for input(s): LIPASE, AMYLASE in the last 168 hours. No results for input(s): AMMONIA in the last 168 hours. Coagulation Profile: Recent Labs  Lab 03/02/21 0947  INR 1.1   Cardiac Enzymes: No results for input(s): CKTOTAL, CKMB, CKMBINDEX, TROPONINI in the last 168 hours. BNP (last 3 results) No results for input(s): PROBNP in the last 8760 hours. HbA1C: No results for input(s): HGBA1C in the last 72 hours. CBG: Recent Labs  Lab 03/08/21 0329  GLUCAP 107*   Lipid Profile: No results for input(s): CHOL, HDL, LDLCALC, TRIG, CHOLHDL, LDLDIRECT in the last 72 hours. Thyroid Function Tests: No results for input(s): TSH, T4TOTAL, FREET4, T3FREE, THYROIDAB in the last 72 hours. Anemia Panel: No results for input(s): VITAMINB12, FOLATE, FERRITIN, TIBC, IRON, RETICCTPCT in the last 72 hours. Sepsis Labs: No results for input(s): PROCALCITON, LATICACIDVEN in the last 168 hours.  Recent Results (from the past 240 hour(s))  Resp Panel by RT-PCR (Flu A&B, Covid) Nasopharyngeal Swab     Status: None   Collection Time: 02/28/21  5:26 PM   Specimen: Nasopharyngeal Swab; Nasopharyngeal(NP) swabs in vial transport medium  Result Value Ref Range Status   SARS Coronavirus 2 by RT PCR NEGATIVE NEGATIVE Final    Comment: (NOTE) SARS-CoV-2 target nucleic acids are NOT DETECTED.  The SARS-CoV-2 RNA is generally detectable in upper respiratory specimens during the acute phase of infection. The lowest concentration of SARS-CoV-2 viral copies this assay can detect is 138 copies/mL. A negative result  does not preclude SARS-Cov-2 infection and should not be used as the sole basis for treatment or other patient management decisions. A negative result may occur with  improper specimen collection/handling, submission of specimen other than nasopharyngeal swab, presence of viral mutation(s) within the areas targeted by this assay, and inadequate number of viral copies(<138 copies/mL). A negative result must be combined with clinical observations, patient history, and epidemiological information. The expected result is Negative.  Fact Sheet for Patients:  EntrepreneurPulse.com.au  Fact Sheet for Healthcare Providers:  IncredibleEmployment.be  This test is no t yet approved or cleared by the [place] FDA and  has been authorized for detection and/or diagnosis of SARS-CoV-2 by FDA under an Emergency Use Authorization (EUA). This EUA will remain  in effect (meaning this test can be used) for the duration of the COVID-19 declaration under Section 564(b)(1) of the Act, 21 U.S.C.section 360bbb-3(b)(1), unless the authorization is terminated  or revoked sooner.       Influenza A by PCR NEGATIVE NEGATIVE Final   Influenza B by PCR NEGATIVE NEGATIVE Final    Comment: (NOTE) The Xpert Xpress SARS-CoV-2/FLU/RSV plus assay is intended as an aid in the diagnosis of influenza from Nasopharyngeal swab specimens and should not be used as a sole basis for treatment. Nasal washings and aspirates are unacceptable for Xpert Xpress SARS-CoV-2/FLU/RSV testing.  Fact Sheet for Patients: EntrepreneurPulse.com.au  Fact Sheet for Healthcare Providers: IncredibleEmployment.be  This test is not yet approved or cleared by the  [place] Architectural technologist and has been authorized for detection and/or diagnosis of SARS-CoV-2 by FDA under an Print production planner (EUA). This EUA will remain in effect (meaning this test can be used) for  the duration of the COVID-19 declaration under Section 564(b)(1) of the Act, 21 U.S.C. section 360bbb-3(b)(1), unless the authorization is terminated or revoked.  Performed at [facility], [place] [address] [place], [place] [address]   MRSA PCR Screening     Status: None   Collection Time: 02/28/21  7:40 PM   Specimen: Nasopharyngeal  Result Value Ref Range Status   MRSA by PCR NEGATIVE NEGATIVE Final    Comment:        The GeneXpert MRSA Assay (FDA approved for NASAL specimens only), is one component of a comprehensive MRSA colonization surveillance program. It is not intended to diagnose MRSA infection nor to guide or monitor treatment for MRSA infections. Performed at [facility], [place] [address] [place], [place] [address]   Culture, blood (single)     Status: None   Collection Time: 03/01/21 12:46 AM   Specimen: BLOOD LEFT HAND  Result Value Ref Range Status   Specimen Description BLOOD LEFT HAND  Final   Special Requests   Final    BOTTLES DRAWN AEROBIC AND ANAEROBIC Blood Culture results may not be optimal due to an inadequate volume of blood received in culture bottles   Culture   Final    NO GROWTH 5 DAYS Performed at [facility], [place] [address] [place], [place] [address]    Report Status 03/06/2021 FINAL  Final     RN Pressure Injury Documentation: Pressure Injury 11/29/20 Sacrum Posterior Stage 2 -  Partial thickness loss of dermis presenting as a shallow open injury with a red, pink wound bed without slough. (Active)  11/29/20 0400  Location: Sacrum  Location Orientation: Posterior  Staging: Stage 2 -  Partial thickness loss of dermis presenting as a shallow open injury with a red, pink wound bed without slough.  Wound Description (Comments):   Present on Admission: Yes    Estimated body mass index is 15.63 kg/m as calculated from the following:   Height as of this encounter: 5' 0.98" (1.549 m).   Weight as of this encounter: 37.5  kg.  Malnutrition Type:  Nutrition Problem: Increased nutrient needs Etiology: wound healing,chronic illness (ESRD, underweight status)   Malnutrition Characteristics:  Signs/Symptoms: estimated needs   Nutrition Interventions:  Interventions: Nepro shake,MVI   Radiology Studies: VAS [place] UPPER EXTREMITY VENOUS DUPLEX  Result Date: 03/08/2021 UPPER VENOUS STUDY  Patient Name:  MADELINN SPINLER  Date of Exam:   03/08/2021 Medical Rec #: [id number]     Accession #:    [id number] Date of Birth: 01-Jul-1961     Patient Gender: F Patient Age:   40Y Exam Location:  [facility] Procedure:      VAS [place] UPPER EXTREMITY VENOUS DUPLEX Referring Phys: [id number] Ryan --------------------------------------------------------------------------------  Indications: Swelling Risk Factors: Surgery LUE brachial-basilic AVF. Limitations: Body habitus, poor ultrasound/tissue interface, bandages, line and patient positioning, patient immobility, patient pain tolerance. Comparison Study: No prior studies. Performing Technologist: Oliver Hum RVT  Examination Guidelines: A complete evaluation includes B-mode imaging, spectral Doppler, color Doppler, and power Doppler as needed of all accessible portions of each vessel. Bilateral testing is considered an integral part of a complete examination. Limited examinations for reoccurring indications may be performed as noted.  Right Findings: +----------+------------+---------+-----------+----------+--------------+ RIGHT  CompressiblePhasicitySpontaneousProperties   Summary     +----------+------------+---------+-----------+----------+--------------+ IJV           Full       Yes       Yes                             +----------+------------+---------+-----------+----------+--------------+ Subclavian                                          Not visualized +----------+------------+---------+-----------+----------+--------------+ Axillary       None       No        No                    AVF       +----------+------------+---------+-----------+----------+--------------+ Brachial    Partial      Yes       Yes                   AVF       +----------+------------+---------+-----------+----------+--------------+ Radial        Full                                                 +----------+------------+---------+-----------+----------+--------------+ Ulnar         Full                                                 +----------+------------+---------+-----------+----------+--------------+ Cephalic      Full                                                 +----------+------------+---------+-----------+----------+--------------+ Basilic       None                                       AVF       +----------+------------+---------+-----------+----------+--------------+ Noncompressible vessels are due to the AVF. An area of mixed echogenicity is noted adjacent to the AVF in the mid upper arm.  Left Findings: +----------+------------+---------+-----------+----------+-------+ LEFT      CompressiblePhasicitySpontaneousPropertiesSummary +----------+------------+---------+-----------+----------+-------+ Subclavian    Full       Yes       Yes                      +----------+------------+---------+-----------+----------+-------+  Summary:  Right: No evidence of deep vein thrombosis in the upper extremity. No evidence of superficial vein thrombosis in the upper extremity. Noncompressible vessels are due to the AVF. An area of mixed echogenicity is noted adjacent to the AVF in the mid upper arm.  Left: No evidence of thrombosis in the subclavian.  *See table(s) above for measurements and observations.     Preliminary     Scheduled Meds: . (feeding supplement) PROSource Plus  30 mL Oral BID BM  . sodium chloride   Intravenous Once  .  sodium chloride   Intravenous Once  . amiodarone  200 mg Oral Daily  . calcitRIOL   0.5 mcg Oral Q T,Th,Sa-HD  . calcium acetate  667 mg Oral TID WC  . [START ON 03/09/2021] Chlorhexidine Gluconate Cloth  6 each Topical Q0600  . feeding supplement (NEPRO CARB STEADY)  237 mL Oral TID BM  . metoprolol tartrate  2.5 mg Intravenous Q8H  . midodrine  10 mg Oral Q T,Th,Sa-HD  . midodrine  5 mg Oral BID WC  . multivitamin  1 tablet Oral QHS  . pantoprazole (PROTONIX) IV  40 mg Intravenous Q24H  . rosuvastatin  10 mg Oral Daily   Continuous Infusions: . sodium chloride    . levETIRAcetam 500 mg (03/08/21 1153)    LOS: 8 days   Kerney Elbe, DO [facility] PAGER is on [place]  If 7PM-7AM, please contact night-coverage [email or web address]

## 2021-03-08 NOTE — Progress Notes (Signed)
[facility] Progress Note   Subjective:   Patient seen and examined at bedside.  Less interactive today.  Answers all questions with a nod.  Denies CP, SOB, and n/v/d.  Reports she is just not hungry - which is unlike her. her normal response when I ask how she is "I'm hungry - can you get me some food."   Objective Vitals:   03/07/21 1130 03/07/21 1215 03/07/21 2049 03/08/21 0454  BP:  126/68 (!) 148/75 118/60  Pulse: 76 77 80 70  Resp: 16 18 (!) 21 15  Temp: 98.4 F (36.9 C) 98.1 F (36.7 C) 98.4 F (36.9 C) 98.2 F (36.8 C)  TempSrc: Axillary Axillary Axillary Axillary  SpO2: 98% 98% 98% 95%  Weight: 37.5 kg     Height:       Physical Exam General:chronically ill appearing, cachetic female in NAD, listless  Heart:RRR Lungs:CTAB, nml WOB Abdomen:soft, NTND Extremities:no LE edema Dialysis Access: [facility], RU AVF +b/t   Filed Weights   03/07/21 0429 03/07/21 0742 03/07/21 1130  Weight: 39.5 kg 39.4 kg 37.5 kg    Intake/Output Summary (Last 24 hours) at 03/08/2021 1119 Last data filed at 03/08/2021 0850 Gross per 24 hour  Intake 489.18 ml  Output 1951 ml  Net -1461.82 ml    Additional Objective Labs: Basic Metabolic Panel: Recent Labs  Lab 03/07/21 0252 03/07/21 0816 03/08/21 0253  NA 136 135 134*  K 4.2 4.3 3.9  CL 99 98 98  CO2 '27 27 30  '$ GLUCOSE 65* 59* 115*  BUN 22* 23* 15  CREATININE 4.98* 5.36* 3.67*  CALCIUM 10.0 9.6 9.6  PHOS 4.2 4.4 3.9   Liver Function Tests: Recent Labs  Lab 03/06/21 0117 03/07/21 0252 03/07/21 0816 03/08/21 0253  AST 15 12*  --  12*  ALT 7 8  --  8  ALKPHOS 51 51  --  50  BILITOT 0.6 0.7  --  0.4  PROT 4.9* 5.0*  --  5.1*  ALBUMIN 1.5* 1.5* 1.5* 1.5*   CBC: Recent Labs  Lab 03/05/21 0237 03/06/21 0117 03/07/21 0252 03/07/21 0826 03/08/21 0253  WBC 6.9 5.9 5.9 6.3 5.4  NEUTROABS 5.4 4.5 4.7  --  4.0  HGB 13.2 13.0 12.9 12.3 11.9*  HCT 40.4 40.7 40.1 39.2 36.9  MCV 84.3 87.2 86.1 87.7 86.4  PLT  158 PLATELET CLUMPS NOTED ON SMEAR, UNABLE TO ESTIMATE PLATELET CLUMPS NOTED ON SMEAR, COUNT APPEARS DECREASED 118* 85*   Blood Culture    Component Value Date/Time   SDES BLOOD LEFT HAND 03/01/2021 0046   SPECREQUEST  03/01/2021 0046    BOTTLES DRAWN AEROBIC AND ANAEROBIC Blood Culture results may not be optimal due to an inadequate volume of blood received in culture bottles   CULT  03/01/2021 0046    NO GROWTH 5 DAYS Performed at [facility], [address], [place], [place] [address]    REPTSTATUS 03/06/2021 FINAL 03/01/2021 0046    CBG: Recent Labs  Lab 03/08/21 0329  GLUCAP 107*    Medications: . sodium chloride    . levETIRAcetam Stopped (03/08/21 0523)   . (feeding supplement) PROSource Plus  30 mL Oral BID BM  . sodium chloride   Intravenous Once  . sodium chloride   Intravenous Once  . amiodarone  200 mg Oral Daily  . calcitRIOL  0.5 mcg Oral Q T,Th,Sa-HD  . calcium acetate  667 mg Oral TID WC  . Chlorhexidine Gluconate Cloth  6 each  Topical Q0600  . feeding supplement (NEPRO CARB STEADY)  237 mL Oral TID BM  . metoprolol tartrate  2.5 mg Intravenous Q8H  . midodrine  10 mg Oral Q T,Th,Sa-HD  . midodrine  5 mg Oral BID WC  . multivitamin  1 tablet Oral QHS  . pantoprazole (PROTONIX) IV  40 mg Intravenous Q24H  . rosuvastatin  10 mg Oral Daily    Dialysis Orders: TTS [facility] 4h 400/500 37.5kg 2/2 bath RUA AVF/ RIJ TDC - sensipar 90 po tiw, on hold from 4/14 - mircera 150 q2 last 4.28 - calcitriol 1.25 ug tiw  Assessment/Plan: 1. Shock- acute,hypotension event very shortly after initiating HD, BP's were in the70'sat HD unit. Sent to ED.Noted drop in hgb in ED, pRBC given.Continues to have hypotension with HD, midodrine added on dialysis days. 2. AMS - 2/2 seizure activity vs hypotension. Less interactive today, not quite a baseline.  3. R arm pain/infiltration- Seen by [facility],infiltration noted but not enough bleeding to be cause of drop in  Hgb. Plan to rest AVF x 2wks. Hb improved now at11.9s/p total 3 units since admission. 4. ESRD- on HD TTS.HD tomorrow per regular schedule.  5. Bp/ volume -BP elevated this AM. MidodrineBID with extra does pre HD - not given today, may be able to d/c if BP remain improved.On admit Vol overloaded on exam and by CT w/ significant ascites and areas of localized pitting edema.  Localized pitting edema improving, continue to titrate down volume as tolerated.  At dry weight post HD yesterday.    6. Seizures- takes keppra 7. Atrial fib- on amio, holdingBBw/ low BP's. Not on AC due to prior ICH, hx of anemia 8. Anemia ckd- aranesp dose given on 03/02/21, d/c now due to hgb>12.Hgb drop from 10 as outpatient on to 7.5 in the ED the same day (5/12), further decline on 5/14 to 6.8. Now s/p 3units of blood since admit and improved to 12.3. Last month had drop in HGb as well, stool cards given but not completed, improved with Kirby Funk.  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 10. Nutrition -changed to dysphagia diet due to spitting out food.  Continues to have poor intake - calorie count in place. Continue protein supplements.   Jen Mow, PA-C [facility] 03/08/2021,11:19 AM  LOS: 8 days

## 2021-03-08 NOTE — Progress Notes (Signed)
EEG Completed; Results Pending  

## 2021-03-08 NOTE — Progress Notes (Signed)
Physical Therapy Treatment Patient Details Name: [name] MRN: [id number] DOB: 01-Jan-1961 Today's Date: 03/08/2021    History of Present Illness 60 year old female with PMHx significant for HTN, diastolic HF (Echo 37/0488 with LVEF 65-70%), CAD (NSTEMI 2009 s/p DES to RCA), PAF (not on [facility]), CVA/SAH s/p VP shunt (2013) and ESRD 2/2 (on HD TThSat via R  AVF).      Patient is encephalopathic, therefore history has been obtained from chart review. Per EMS, patient began dialysis and 30 seconds after cannulation, site was noted to be infiltrating into the R chest and R arm, and became unresponsive. EMS was called and began Basic Life support ventilations, applied Tourniquet clamp, transported to hospital where she was found to be hypotensive and in shock; has recieved 3 units of PRBCs during hospital stay as of 5/15    PT Comments    Patient received in bed after cortrak placement; continues to require totaAx2 for all aspects of mobility, but able to activate core musculature to assist in keeping balance at EOB today! Did not tolerate sitting more than a couple minutes, just didn't seem to be feeling good today. Worked on heel cord stretches and knee ROM in the bed, however xray team then arrived and needed to work with patient- so session was a bit limited. Left in bed with all needs met with mitts on and bed alarm active, xray team present and attending. Will continue to follow.    Follow Up Recommendations  SNF     Equipment Recommendations  Rolling walker with 5" wheels;3in1 (PT);Hospital bed;Other (comment);Wheelchair (measurements PT);Wheelchair cushion (measurements PT) (hoyer lift and pads)    Recommendations for Other Services       Precautions / Restrictions Precautions Precautions: Fall;Other (comment) Precaution Comments: cortrak, mitts Restrictions Weight Bearing Restrictions: No    Mobility  Bed Mobility Overal bed mobility: Needs Assistance Bed Mobility: Supine to  Sit;Sit to Supine     Supine to sit: Total assist;+2 for physical assistance Sit to supine: Total assist;+2 for physical assistance   General bed mobility comments: totalaX2 for all aspects of bed mobility. Less posterior lean and actively able to use core muscles to correct sitting balance today    Transfers                 General transfer comment: deferred- xray arrived to work with patient  Ambulation/Gait             General Gait Details: unable   Marine scientist [place] (Stroke Patients Only)       Balance Overall balance assessment: Needs assistance Sitting-balance support: Feet supported Sitting balance-Leahy Scale: Poor Sitting balance - Comments: can have heavy posterior lean at times (less today),  able to activate core to perform postural corrections Postural control: Posterior lean   Standing balance-Leahy Scale: Zero                              Cognition Arousal/Alertness: Lethargic Behavior During Therapy: Flat affect Overall Cognitive Status: No family/caregiver present to determine baseline cognitive functioning                                 General Comments: very minimal interactions today- did shake her head yes and no but at appropriate times.  Exercises      General Comments        Pertinent Vitals/Pain Pain Assessment: Faces Faces Pain Scale: Hurts little more Pain Location: grimacing with movement, trying to pull at cortrak Pain Descriptors / Indicators: Discomfort;Grimacing Pain Intervention(s): Limited activity within patient's tolerance;Monitored during session    Home Living                      Prior Function            PT Goals (current goals can now be found in the care plan section) Acute Rehab PT Goals Patient Stated Goal: did not state PT Goal Formulation: With patient Time For Goal Achievement: 03/17/21 Potential to  Achieve Goals: Fair Progress towards PT goals: Not progressing toward goals - comment (limited by lethargy)    Frequency    Min 2X/week      PT Plan Current plan remains appropriate    Co-evaluation              AM-PAC PT "6 Clicks" Mobility   Outcome Measure  Help needed turning from your back to your side while in a flat bed without using bedrails?: Total Help needed moving from lying on your back to sitting on the side of a flat bed without using bedrails?: Total Help needed moving to and from a bed to a chair (including a wheelchair)?: Total Help needed standing up from a chair using your arms (e.g., wheelchair or bedside chair)?: Total Help needed to walk in hospital room?: Total Help needed climbing 3-5 steps with a railing? : Total 6 Click Score: 6    End of Session   Activity Tolerance: Patient limited by lethargy;Other (comment) (xray team arrived to work wtih patient) Patient left: in bed;with call bell/phone within reach;with bed alarm set;Other (comment) (xray staff present and attending) Nurse Communication: Mobility status PT Visit Diagnosis: Unsteadiness on feet (R26.81);Other abnormalities of gait and mobility (R26.89);Muscle weakness (generalized) (M62.81)     Time: 2778-2423 PT Time Calculation (min) (ACUTE ONLY): 12 min  Charges:  $Therapeutic Activity: 8-22 mins                     Windell Norfolk, DPT, PN1   Supplemental Physical Therapist [place]    Pager [phone] Acute Rehab Office [phone]

## 2021-03-09 DIAGNOSIS — R638 Other symptoms and signs concerning food and fluid intake: Secondary | ICD-10-CM | POA: Diagnosis not present

## 2021-03-09 DIAGNOSIS — R627 Adult failure to thrive: Secondary | ICD-10-CM | POA: Diagnosis not present

## 2021-03-09 DIAGNOSIS — R5381 Other malaise: Secondary | ICD-10-CM | POA: Diagnosis not present

## 2021-03-09 LAB — CBC WITH DIFFERENTIAL/PLATELET
Abs Immature Granulocytes: 0.02 10*3/uL (ref 0.00–0.07)
Basophils Absolute: 0 10*3/uL (ref 0.0–0.1)
Basophils Relative: 0 %
Eosinophils Absolute: 0 10*3/uL (ref 0.0–0.5)
Eosinophils Relative: 0 %
HCT: 37.3 % (ref 36.0–46.0)
Hemoglobin: 11.7 g/dL — ABNORMAL LOW (ref 12.0–15.0)
Immature Granulocytes: 0 %
Lymphocytes Relative: 14 %
Lymphs Abs: 0.8 10*3/uL (ref 0.7–4.0)
MCH: 27.7 pg (ref 26.0–34.0)
MCHC: 31.4 g/dL (ref 30.0–36.0)
MCV: 88.2 fL (ref 80.0–100.0)
Monocytes Absolute: 0.3 10*3/uL (ref 0.1–1.0)
Monocytes Relative: 5 %
Neutro Abs: 4.5 10*3/uL (ref 1.7–7.7)
Neutrophils Relative %: 81 %
Platelets: 91 10*3/uL — ABNORMAL LOW (ref 150–400)
RBC: 4.23 MIL/uL (ref 3.87–5.11)
RDW: 18.4 % — ABNORMAL HIGH (ref 11.5–15.5)
WBC: 5.6 10*3/uL (ref 4.0–10.5)
nRBC: 0 % (ref 0.0–0.2)

## 2021-03-09 LAB — GLUCOSE, CAPILLARY
Glucose-Capillary: 132 mg/dL — ABNORMAL HIGH (ref 70–99)
Glucose-Capillary: 77 mg/dL (ref 70–99)
Glucose-Capillary: 95 mg/dL (ref 70–99)
Glucose-Capillary: 123 mg/dL — ABNORMAL HIGH (ref 70–99)

## 2021-03-09 LAB — COMPREHENSIVE METABOLIC PANEL
ALT: 9 U/L (ref 0–44)
AST: 16 U/L (ref 15–41)
Albumin: 1.4 g/dL — ABNORMAL LOW (ref 3.5–5.0)
Alkaline Phosphatase: 49 U/L (ref 38–126)
Anion gap: 5 (ref 5–15)
BUN: 25 mg/dL — ABNORMAL HIGH (ref 6–20)
CO2: 29 mmol/L (ref 22–32)
Calcium: 9.5 mg/dL (ref 8.9–10.3)
Chloride: 100 mmol/L (ref 98–111)
Creatinine, Ser: 4.33 mg/dL — ABNORMAL HIGH (ref 0.44–1.00)
GFR, Estimated: 11 mL/min — ABNORMAL LOW (ref >60)
Glucose, Bld: 114 mg/dL — ABNORMAL HIGH (ref 70–99)
Potassium: 4.2 mmol/L (ref 3.5–5.1)
Sodium: 134 mmol/L — ABNORMAL LOW (ref 135–145)
Total Bilirubin: 0.4 mg/dL (ref 0.3–1.2)
Total Protein: 4.8 g/dL — ABNORMAL LOW (ref 6.5–8.1)

## 2021-03-09 LAB — MAGNESIUM: Magnesium: 2.2 mg/dL (ref 1.7–2.4)

## 2021-03-09 LAB — PHOSPHORUS: Phosphorus: 4 mg/dL (ref 2.5–4.6)

## 2021-03-09 MED ORDER — HEPARIN SODIUM (PORCINE) 1000 UNIT/ML IJ SOLN
INTRAMUSCULAR | Status: AC
  Administered 2021-03-09: 3200 [IU] via INTRAVENOUS
  Filled 2021-03-09: qty 4

## 2021-03-09 MED ORDER — FREE WATER
50.0000 mL | Freq: Four times a day (QID) | Status: DC
  Administered 2021-03-09 – 2021-03-12 (×12): 50 mL

## 2021-03-09 MED ORDER — ORAL CARE MOUTH RINSE
15.0000 mL | Freq: Two times a day (BID) | OROMUCOSAL | Status: DC
  Administered 2021-03-09 – 2021-03-10 (×3): 15 mL via OROMUCOSAL

## 2021-03-09 MED ORDER — CHLORHEXIDINE GLUCONATE 0.12 % MT SOLN
15.0000 mL | Freq: Two times a day (BID) | OROMUCOSAL | Status: DC
  Administered 2021-03-09 – 2021-03-12 (×6): 15 mL via OROMUCOSAL
  Filled 2021-03-09 (×5): qty 15

## 2021-03-09 MED ORDER — PANTOPRAZOLE SODIUM 40 MG PO PACK
40.0000 mg | PACK | Freq: Every day | ORAL | Status: DC
  Administered 2021-03-09 – 2021-03-11 (×3): 40 mg
  Filled 2021-03-09 (×4): qty 20

## 2021-03-09 MED ORDER — LEVETIRACETAM 100 MG/ML PO SOLN
500.0000 mg | Freq: Two times a day (BID) | ORAL | Status: DC
  Administered 2021-03-09 – 2021-03-11 (×6): 500 mg
  Filled 2021-03-09 (×7): qty 5

## 2021-03-09 NOTE — Progress Notes (Signed)
PROGRESS NOTE    [name]  [id number] DOB: Sep 27, 1961 DOA: 02/28/2021 PCP: Pcp, No  Brief Narrative:  The patient is a extremely thin cachectic and chronically ill-appearing 60 year old African-American female with a past medical history significant for PAD, history of CVA, history of subarachnoid hemorrhage, history of seizures, history of proximal atrial fibrillation, history of chronic urinary issues, hyperlipidemia, hypertension, ESRD on hemodialysis, COPD as well as other comorbidities who presented to ED from her hemodialysis unit after right arm AV fistula infiltrated and her blood pressure dropped.  She became extremely altered and hemoglobin in the ER returned to 7.4 which is down from 13.  She was admitted to the ICU and got 1 unit PRBCs and seen by vascular surgery stabilized and transferred to [facility] on 03/02/2021 with a hemoglobin 6.9.  Subsequently she received 2 more units and hemoglobin improved and is now stable.  Her p.o. and intake has been extremely marginal and poor.  She has failure to thrive and palliative care has been consulted for goals of care discussion however we cannot get in touch with the patient's family members at all.  Nursing stated that she spit out all her medications today and has had a poor appetite again today.  Calorie count done and after discussion with the nutritionist she is unlikely to meet her nutritional needs so we will go ahead and place a Cortrak for tube feeding purposes and reevaluate Monday if her nutritional status is any better while she gets TF.  Her mentation continues to wax and wane and she is not as awake today so Neurology was consulted for further evaluation and Recc's   Assessment & Plan:   Active Problems:   Hemorrhagic shock (HCC)  Acute metabolic encephalopathy and hemodialysis with hypotension/shock and acute drop in hemoglobin -Initially there was a question of her right arm AV fistula played, she also has a history of  underlying seizures and subdural hematoma -She is more somnolent and withdrawn today and nephrology does not feel that she is at her baseline.  She continues have extremely poor p.o. intake and her calorie count reveals that she is not meeting her nutritional needs so we will place a core track for tube feedings -may discuss with neurology about her mental status -She had a hemoglobin of 6.9 initially but this is improved and her last hemoglobin/hematocrit was 13.0/40.7 -A DIC panel was checked including LDH, haptoglobin and occult stool and a CT of the abdomen pelvis was done to rule out any retroperitoneal loss -She received 1 unit of PRBCs in ICU and she was given 2 more units of PRBCs with hemodialysis on 03/02/2021 -Echocardiogram has been checked to ensure valves are stable -Continue to monitor her mentation status carefully and will order a calorie count and a dietitian consult -she continues to be hypotensive with hemodialysis but midodrine has been added back on dialysis days which is improved -Will Check EEG today and Last Head CT was 02/28/21 and showed "Stable postoperative changes. Stable position of ventriculoperitoneal shunt with tip posterior to the third ventricle in the left midbrain region. Stable ventricular enlargement. Multiple prior infarcts, largest in the left parietal lobe region. No acute infarct evident. No mass, hemorrhage, or appreciable extra-axial fluid collections. Multiple foci of arterial vascular calcification evident."  -Repeat EEG done and showed the study was suggestive of cortical dysfunction arising from the left frontotemporal region likely secondary to underlying structural abnormalities as well as a mild diffuse encephalopathy which is nonspecific in etiology with no  seizures or epileptiform discharges seen throughout the whole reading. -Will repeat head CT and showed "Stable chronic findings as described above. No acute intracranial abnormality seen." -Her  mentation waxes and wanes and so neurology was consulted for further evaluation and they are pursuing a metabolic work-up but do suspect this is secondary to her poor p.o. intake and failure to thrive. -We will need to continue to have goals of care discussion and will continue her Cortrak tube feedings at this time -Neurology not pursing MRI given her Shunt Coils   History of seizures, bilateral SDH and VP shunt -He has no headache and a supple neck and she is continue Keppra which she takes at home but because of her poor p.o. intake we have changes to IV -We will continue monitor closely and there is a question if she had a fluid imbalance induced hypotension causing seizure activity at her dialysis unit causing her AMS -Check EEG as above -She is not at baseline given that she is more withdrawan so will discuss with Neurology and have them formally consult   Acute anemia superimposed on anemia of chronic kidney disease -It is unclear if she had a lab error that showed a low hemoglobin hematocrit.  After 2 units of PRBCs her hemoglobin trended up to 11 and is now stable at 13 as above -Patient is Hgb/Hct is now slowly dropping and gone from 13.0/40.7 -> 12.9/40.1 ->12.3/39.2 -> 11.9/36.9 and today was 11.7/37.3 -CT of the abdomen and pelvis showed no retroperitoneal bleed and the echocardiogram of the abdominal aorta was without any major valvular abnormalities and there is no obvious source of ongoing bleeding -Hemoglobin/hematocrit is now stable -Continue monitor for signs and symptoms of bleeding; no overt bleeding noted  GERD -Changed PPI to IV given her poor p.o. intake  Right arm AV Fistula Infiltration -Seen by vascular surgery and they feel that the infiltration of the hematoma on the right arm is not impressive and this would not profoundly explain her anemia in the plan to rest the fistula for about 2 weeks and dialyze via a right IJ [facility] -Dr. [name] feels that the aVF may have a  pseudoaneurysm but this does not need urgent intervention -Right Upper Extremity Duplex negative for DVT  Severe failure to thrive and deconditioning -PT OT evaluated and recommending SNF next-patient is a very poor p.o. intake so we have consulted nutritionist and have started a calorie count -Palliative care has been called for goals of care discussion question -SLP has been consulted for evaluation and they have placed her on a dysphagia 2 diet with thin liquid; her diet has been changed to dysphagia diet due to her spitting out food and meds -Continue with protein calorie supplements and calorie count; Patient failed Calorie Count so will place Cortrak and start TF -Prevalon boots have been added -Continue with tube feedings as she only 85% of her nutritional goals via tube feedings  ESRD on Tuesday Thursday Saturday  -Nephrology is following and patient got dialyzed through a right IJ dialysis catheter since her right AV fistula has infiltrated and she has been seen by the vascular surgery.  If she has been repaired and they are recommending rest at this time -Patient's BUN/Cr went from 22/4.98 -> 23/5.36 -> 15/3.67 and today is 25/4.33 -continue with calcitriol 0.5 mcg every Tuesday Thursday Saturday and continue with calcium acetate 667 p.o. 3 times daily with meals  -Continue to Monitor and Trend Renal Fxn carefully -Repeat CMP in the AM -  Appreciate nephrology evaluation and recommendations as she is tolerating hemodialysis well and having ultrafiltration as tolerated  Proximal Atrial Fibrillation -Continue with Metoprolol for rate control and Amiodarone -She is not a candidate for anticoagulation given her intracranial hemorrhage/subarachnoid hemorrhage/anemia -Head CT done as above; may pursue MRI but will await neurology recommendations  History of right lower extremity DVT -Age is indeterminate and she is not a candidate for anticoagulation due to her subdural bleeds and previous  admissions IR was consulted for IVC filter but this was not recommended at that time. -Continue monitor  Hypothyroidism -She was on a beta-blocker and apparently methimazole discontinued last admission -She has a stable TSH and she needs outpatient endocrine follow-up at discharge  Anasarca with ascites and effusion due to severe protein calorie malnutrition and extremely poor nutritional status -She is asymptomatic and she is on supplements  -Nutritionist consulted for further evaluation and she is now getting calorie count and because she did not pass will get a Cortrak  Goals of care -Her baseline quality of life seems to be extremely poor due to her brain injury and extremely poor nutritional status as well as multiple comorbidities including ESRD.  She is extremely poor functional status and we have consulted palliative care for goals of care consultation and discussion -Unable to reach family again today and Palliative recommending outpatient Palliative now -Will place Cortrak given inadequate Nutritional Intake and will need to Re-evaluate with GOC discussion once able to get in touch with family   -Patient needs to go back to SNF but cannot go to SNF with a core track tube in place will need to figure out further goals of care   Hypertension -On nondialysis days her systolics are between [id number] and 140 requiring low-dose beta-blockers however she extremely sensitive to fluid shifts and on the days of dialysis her systolic falls below 80.  She gets midodrine during the dialysis times and nephrology is following closely -Continue monitor blood pressures per protocol neurology is trying to maximize ultrafiltration and titrate down volume as tolerated -Last BP was 135/67  Hyperlipidemia -Continue Rosuvastatin 10 mg p.o. daily if able to tolerate p.o.  DVT prophylaxis: SCDs Code Status: FULL CODE  Family Communication: No family present at bedside  Disposition Plan: Pending further  clinical improvement and tolerance of the diet and insurance that she is adequately taking in enough nutrition for safe discharge disposition; She will need SNF at D/C   Status is: Inpatient  Remains inpatient appropriate because:Unsafe d/c plan, IV treatments appropriate due to intensity of illness or inability to take PO and Inpatient level of care appropriate due to severity of illness   Dispo: The patient is from: Home              Anticipated d/c is to: SNF              Patient currently is not medically stable to d/c.   Difficult to place patient No  Consultants:   Nephrology  Palliative Care Medicine   Neurology    Procedures: Hemodialysis  Antimicrobials:  Anti-infectives (From admission, onward)   Start     Dose/Rate Route Frequency Ordered Stop   03/08/21 1815  doxycycline (VIBRAMYCIN) 100 mg in sodium chloride 0.9 % 250 mL IVPB        100 mg 125 mL/hr over 120 Minutes Intravenous Every 12 hours 03/08/21 1722     03/02/21 1200  vancomycin (VANCOCIN) IVPB 500 mg/100 ml premix  Status:  Discontinued  500 mg 100 mL/hr over 60 Minutes Intravenous Every T-Th-Sa (Hemodialysis) 02/28/21 1747 03/01/21 0824   03/01/21 1800  ceFEPIme (MAXIPIME) 1 g in sodium chloride 0.9 % 100 mL IVPB  Status:  Discontinued        1 g 200 mL/hr over 30 Minutes Intravenous Every 24 hours 02/28/21 1747 03/01/21 0824   02/28/21 1800  vancomycin (VANCOREADY) IVPB 1000 mg/200 mL        1,000 mg 200 mL/hr over 60 Minutes Intravenous  Once 02/28/21 1716 02/28/21 1957   02/28/21 1730  ceFEPIme (MAXIPIME) 2 g in sodium chloride 0.9 % 100 mL IVPB        2 g 200 mL/hr over 30 Minutes Intravenous  Once 02/28/21 1716 02/28/21 1855   02/28/21 1730  metroNIDAZOLE (FLAGYL) IVPB 500 mg        500 mg 100 mL/hr over 60 Minutes Intravenous  Once 02/28/21 1716 02/28/21 1938        Subjective: Seen and examined at bedside and she was seen in dialysis today and denies any complaints.  No nausea or  vomiting.  Getting tube feedings.  Mentation is little bit more awake today than it was yesterday.  No other concerns or points at this time.  Objective: Vitals:   03/09/21 1100 03/09/21 1119 03/09/21 1120 03/09/21 1226  BP: 134/70 (!) 148/58 (!) 138/59 135/67  Pulse: 82 85 79 78  Resp:      Temp:   97.9 F (36.6 C)   TempSrc:   Oral   SpO2:   97% 98%  Weight:   38.6 kg   Height:        Intake/Output Summary (Last 24 hours) at 03/09/2021 1747 Last data filed at 03/09/2021 1300 Gross per 24 hour  Intake 519.17 ml  Output 1879 ml  Net -1359.83 ml   Filed Weights   03/07/21 1130 03/09/21 0715 03/09/21 1120  Weight: 37.5 kg 40.1 kg 38.6 kg   Examination: Physical Exam:  Constitutional: The patient is a thin frail and cachectic African-American female who is a little bit more awake today in dialysis not complaining of any pain but is somewhat confused  Eyes: Lids and conjunctivae normal, sclerae anicteric  ENMT: External Ears, Nose appear normal. Grossly normal hearing.  NG tube in place Neck: Appears normal, supple, no cervical masses, normal ROM, no appreciable thyromegaly; no JVD Respiratory: Diminished to auscultation bilaterally with coarse breath sounds, no wheezing, rales, rhonchi or crackles. Normal respiratory effort and patient is not tachypenic. No accessory muscle use.  Cardiovascular: RRR, no murmurs / rubs / gallops. S1 and S2 auscultated.  No extremity edema noted Abdomen: Soft, non-tender, non-distended. Bowel sounds positive.  GU: Deferred. Musculoskeletal: No clubbing / cyanosis of digits/nails. No joint deformity upper and lower extremities.  Skin: No rashes, lesions, ulcers on limited skin evaluation. No induration; Warm and dry.  Neurologic: CN 2-12 grossly intact with no focal deficits. Romberg sign and cerebellar reflexes not assessed.  Psychiatric: Impaired judgment and insight.  She is more awake and alert today compared to yesterday but not fully oriented.   Depressed appearing mood and appropriate affect.   Data Reviewed: I have personally reviewed following labs and imaging studies  CBC: Recent Labs  Lab 03/05/21 0237 03/06/21 0117 03/07/21 0252 03/07/21 0826 03/08/21 0253 03/09/21 0102  WBC 6.9 5.9 5.9 6.3 5.4 5.6  NEUTROABS 5.4 4.5 4.7  --  4.0 4.5  HGB 13.2 13.0 12.9 12.3 11.9* 11.7*  HCT 40.4 40.7 40.1 39.2  36.9 37.3  MCV 84.3 87.2 86.1 87.7 86.4 88.2  PLT 158 PLATELET CLUMPS NOTED ON SMEAR, UNABLE TO ESTIMATE PLATELET CLUMPS NOTED ON SMEAR, COUNT APPEARS DECREASED 118* 85* 91*   Basic Metabolic Panel: Recent Labs  Lab 03/06/21 0117 03/07/21 0252 03/07/21 0816 03/08/21 0253 03/09/21 0102  NA 136 136 135 134* 134*  K 3.7 4.2 4.3 3.9 4.2  CL 100 99 98 98 100  CO2 '28 27 27 30 29  '$ GLUCOSE 83 65* 59* 115* 114*  BUN 15 22* 23* 15 25*  CREATININE 3.98* 4.98* 5.36* 3.67* 4.33*  CALCIUM 9.6 10.0 9.6 9.6 9.5  MG  --  2.1  --  2.1 2.2  PHOS  --  4.2 4.4 3.9 4.0   GFR: Estimated Creatinine Clearance: 8.6 mL/min (A) (by C-G formula based on SCr of 4.33 mg/dL (H)). Liver Function Tests: Recent Labs  Lab 03/05/21 0237 03/06/21 0117 03/07/21 0252 03/07/21 0816 03/08/21 0253 03/09/21 0102  AST 12* 15 12*  --  12* 16  ALT '7 7 8  '$ --  8 9  ALKPHOS 53 51 51  --  50 49  BILITOT 0.6 0.6 0.7  --  0.4 0.4  PROT 5.0* 4.9* 5.0*  --  5.1* 4.8*  ALBUMIN 1.5* 1.5* 1.5* 1.5* 1.5* 1.4*   No results for input(s): LIPASE, AMYLASE in the last 168 hours. Recent Labs  Lab 03/08/21 1801  AMMONIA 16   Coagulation Profile: No results for input(s): INR, PROTIME in the last 168 hours. Cardiac Enzymes: No results for input(s): CKTOTAL, CKMB, CKMBINDEX, TROPONINI in the last 168 hours. BNP (last 3 results) No results for input(s): PROBNP in the last 8760 hours. HbA1C: No results for input(s): HGBA1C in the last 72 hours. CBG: Recent Labs  Lab 03/08/21 0329 03/08/21 2330 03/09/21 0410 03/09/21 1216 03/09/21 1739  GLUCAP 107* 107*  132* 77 95   Lipid Profile: No results for input(s): CHOL, HDL, LDLCALC, TRIG, CHOLHDL, LDLDIRECT in the last 72 hours. Thyroid Function Tests: Recent Labs    03/08/21 1801  TSH 1.635   Anemia Panel: Recent Labs    03/08/21 1801  VITAMINB12 774  FOLATE 6.7   Sepsis Labs: No results for input(s): PROCALCITON, LATICACIDVEN in the last 168 hours.  Recent Results (from the past 240 hour(s))  Resp Panel by RT-PCR (Flu A&B, Covid) Nasopharyngeal Swab     Status: None   Collection Time: 02/28/21  5:26 PM   Specimen: Nasopharyngeal Swab; Nasopharyngeal(NP) swabs in vial transport medium  Result Value Ref Range Status   SARS Coronavirus 2 by RT PCR NEGATIVE NEGATIVE Final    Comment: (NOTE) SARS-CoV-2 target nucleic acids are NOT DETECTED.  The SARS-CoV-2 RNA is generally detectable in upper respiratory specimens during the acute phase of infection. The lowest concentration of SARS-CoV-2 viral copies this assay can detect is 138 copies/mL. A negative result does not preclude SARS-Cov-2 infection and should not be used as the sole basis for treatment or other patient management decisions. A negative result may occur with  improper specimen collection/handling, submission of specimen other than nasopharyngeal swab, presence of viral mutation(s) within the areas targeted by this assay, and inadequate number of viral copies(<138 copies/mL). A negative result must be combined with clinical observations, patient history, and epidemiological information. The expected result is Negative.  Fact Sheet for Patients:  EntrepreneurPulse.com.au  Fact Sheet for Healthcare Providers:  IncredibleEmployment.be  This test is no t yet approved or cleared by the [place] and  has been authorized for detection and/or diagnosis of SARS-CoV-2 by FDA under an Emergency Use Authorization (EUA). This EUA will remain  in effect (meaning this test can be used)  for the duration of the COVID-19 declaration under Section 564(b)(1) of the Act, 21 U.S.C.section 360bbb-3(b)(1), unless the authorization is terminated  or revoked sooner.       Influenza A by PCR NEGATIVE NEGATIVE Final   Influenza B by PCR NEGATIVE NEGATIVE Final    Comment: (NOTE) The Xpert Xpress SARS-CoV-2/FLU/RSV plus assay is intended as an aid in the diagnosis of influenza from Nasopharyngeal swab specimens and should not be used as a sole basis for treatment. Nasal washings and aspirates are unacceptable for Xpert Xpress SARS-CoV-2/FLU/RSV testing.  Fact Sheet for Patients: EntrepreneurPulse.com.au  Fact Sheet for Healthcare Providers: IncredibleEmployment.be  This test is not yet approved or cleared by the [place] FDA and has been authorized for detection and/or diagnosis of SARS-CoV-2 by FDA under an Emergency Use Authorization (EUA). This EUA will remain in effect (meaning this test can be used) for the duration of the COVID-19 declaration under Section 564(b)(1) of the Act, 21 U.S.C. section 360bbb-3(b)(1), unless the authorization is terminated or revoked.  Performed at [facility], [place] [address], [place], [place] [address]   MRSA PCR Screening     Status: None   Collection Time: 02/28/21  7:40 PM   Specimen: Nasopharyngeal  Result Value Ref Range Status   MRSA by PCR NEGATIVE NEGATIVE Final    Comment:        The GeneXpert MRSA Assay (FDA approved for NASAL specimens only), is one component of a comprehensive MRSA colonization surveillance program. It is not intended to diagnose MRSA infection nor to guide or monitor treatment for MRSA infections. Performed at [facility], [place] [address], [place], [place] [address]   Culture, blood (single)     Status: None   Collection Time: 03/01/21 12:46 AM   Specimen: BLOOD LEFT HAND  Result Value Ref Range Status   Specimen Description BLOOD LEFT HAND   Final   Special Requests   Final    BOTTLES DRAWN AEROBIC AND ANAEROBIC Blood Culture results may not be optimal due to an inadequate volume of blood received in culture bottles   Culture   Final    NO GROWTH 5 DAYS Performed at [facility], [place] [address], [place], [place] [address]    Report Status 03/06/2021 FINAL  Final     RN Pressure Injury Documentation: Pressure Injury 11/29/20 Sacrum Posterior Stage 2 -  Partial thickness loss of dermis presenting as a shallow open injury with a red, pink wound bed without slough. (Active)  11/29/20 0400  Location: Sacrum  Location Orientation: Posterior  Staging: Stage 2 -  Partial thickness loss of dermis presenting as a shallow open injury with a red, pink wound bed without slough.  Wound Description (Comments):   Present on Admission: Yes    Estimated body mass index is 16.09 kg/m as calculated from the following:   Height as of this encounter: 5' 0.98" (1.549 m).   Weight as of this encounter: 38.6 kg.  Malnutrition Type:  Nutrition Problem: Severe Malnutrition Etiology: chronic illness (ESRD on HD)   Malnutrition Characteristics:  Signs/Symptoms: moderate fat depletion,severe fat depletion,moderate muscle depletion,severe muscle depletion   Nutrition Interventions:  Interventions: Tube feeding   Radiology Studies: CT HEAD WO CONTRAST  Result Date: 03/08/2021 CLINICAL DATA:  Altered mental status. EXAM: CT  HEAD WITHOUT CONTRAST TECHNIQUE: Contiguous axial images were obtained from the base of the skull through the vertex without intravenous contrast. COMPARISON:  Feb 28, 2021. FINDINGS: Brain: Stable left posterior parietal encephalomalacia. Stable old left cerebellar infarction. Right frontal ventriculostomy catheter is noted with distal tip in the third ventricle. Stable ventricular size is noted compared to prior exam. No midline shift is noted. No acute infarction or hemorrhage is noted. Vascular: No hyperdense  vessel or unexpected calcification. Skull: Status post right frontal craniotomy. No acute abnormality is noted. Sinuses/Orbits: No acute finding. Other: None. IMPRESSION: Stable chronic findings as described above. No acute intracranial abnormality seen. Electronically Signed   By: Marijo Conception M.D.   On: 03/08/2021 18:29   DG CHEST PORT 1 VIEW  Result Date: 03/08/2021 CLINICAL DATA:  Post feeding tube placement. EXAM: PORTABLE CHEST 1 VIEW COMPARISON:  02/28/2021 FINDINGS: Grossly unchanged cardiac silhouette and mediastinal contours with atherosclerotic plaque within the thoracic aorta. Loop recorder device overlies the left heart border. Pulmonary vasculature remains indistinct with cephalization of flow. Unchanged small layering right-sided effusion with associated right basilar opacities. Worsening left basilar/retrocardiac opacities. Interval placement enteric tube with tip overlying expected location of the gastric antrum. Otherwise, stable positioning of support apparatus. No pneumothorax. No acute osseous abnormalities. IMPRESSION: 1. Enteric tube tip projects over the expected location of the gastric antrum. 2. Stable position of remaining support apparatus.  No pneumothorax. 3. Similar findings of pulmonary edema small right-sided effusion and associated right basilar opacities, atelectasis versus infiltrate. 4. Worsening left basilar/retrocardiac opacities, atelectasis versus infiltrate. Electronically Signed   By: Sandi Mariscal M.D.   On: 03/08/2021 16:09   DG Abd Portable 1V  Result Date: 03/08/2021 CLINICAL DATA:  Evaluate feeding tube placement EXAM: PORTABLE ABDOMEN - 1 VIEW COMPARISON:  None. FINDINGS: There is a feeding tube with tip in the projection of the gastric antrum. Right-sided ventriculoperitoneal shunt tubing is noted. The visualized portions of the tubing appear intact. No dilated bowel loops identified. Moderate stool noted within the colon. IMPRESSION: Feeding tube tip is in  the gastric antrum. Electronically Signed   By: Kerby Moors M.D.   On: 03/08/2021 15:32   EEG adult  Result Date: 03/08/2021 Lora Havens, MD     03/08/2021  4:47 PM Patient Name: [name] MRN: [id number] Epilepsy Attending: Lora Havens Referring Physician/Provider: Dr Carlisle Cater Date: 03/08/2021 Duration: 23.45 mins Patient history: 59yo F with ams. EEG to evaluate for seizur Level of alertness: Awake, asleep AEDs during EEG study: LEV Technical aspects: This EEG study was done with scalp electrodes positioned according to the 10-20 International system of electrode placement. Electrical activity was acquired at a sampling rate of '500Hz'$  and reviewed with a high frequency filter of '70Hz'$  and a low frequency filter of '1Hz'$ . EEG data were recorded continuously and digitally stored. Description: The posterior dominant rhythm consists of 7.5 Hz activity of moderate voltage (25-35 uV) seen predominantly in posterior head regions, symmetric and reactive to eye opening and eye closing. Sleep was characterized by vertex waves, sleep spindles (12 to 14 Hz), maximal frontocentral region.  EEG showed continuous generalized and maximal left frontotemporal region theta-delta slowing.  Hyperventilation and photic stimulation were not performed.   ABNORMALITY - Continuous slow, generalized and maximal left frontotemporal region IMPRESSION: This study is suggestive of cortical dysfunction arising from left fronto-temporal region likely secondary to underlying structural abnormality as well as mild diffuse encephalopathy, nonspecific etiology. No seizures or epileptiform discharges were seen  throughout the recording. Priyanka O Yadav   VAS [place] UPPER EXTREMITY VENOUS DUPLEX  Result Date: 03/08/2021 UPPER VENOUS STUDY  Patient Name:  MAYRANI REIS  Date of Exam:   03/08/2021 Medical Rec #: [id number]     Accession #:    [id number] Date of Birth: 29-Nov-1960     Patient Gender: F Patient Age:   35Y Exam Location:   [facility] Procedure:      VAS [place] UPPER EXTREMITY VENOUS DUPLEX Referring Phys: [id number] [place] --------------------------------------------------------------------------------  Indications: Swelling Risk Factors: Surgery LUE brachial-basilic AVF. Limitations: Body habitus, poor ultrasound/tissue interface, bandages, line and patient positioning, patient immobility, patient pain tolerance. Comparison Study: No prior studies. Performing Technologist: Oliver Hum RVT  Examination Guidelines: A complete evaluation includes B-mode imaging, spectral Doppler, color Doppler, and power Doppler as needed of all accessible portions of each vessel. Bilateral testing is considered an integral part of a complete examination. Limited examinations for reoccurring indications may be performed as noted.  Right Findings: +----------+------------+---------+-----------+----------+--------------+ RIGHT     CompressiblePhasicitySpontaneousProperties   Summary     +----------+------------+---------+-----------+----------+--------------+ IJV           Full       Yes       Yes                             +----------+------------+---------+-----------+----------+--------------+ Subclavian                                          Not visualized +----------+------------+---------+-----------+----------+--------------+ Axillary      None       No        No                    AVF       +----------+------------+---------+-----------+----------+--------------+ Brachial    Partial      Yes       Yes                   AVF       +----------+------------+---------+-----------+----------+--------------+ Radial        Full                                                 +----------+------------+---------+-----------+----------+--------------+ Ulnar         Full                                                 +----------+------------+---------+-----------+----------+--------------+  Cephalic      Full                                                 +----------+------------+---------+-----------+----------+--------------+ Basilic       None  AVF       +----------+------------+---------+-----------+----------+--------------+ Noncompressible vessels are due to the AVF. An area of mixed echogenicity is noted adjacent to the AVF in the mid upper arm.  Left Findings: +----------+------------+---------+-----------+----------+-------+ LEFT      CompressiblePhasicitySpontaneousPropertiesSummary +----------+------------+---------+-----------+----------+-------+ Subclavian    Full       Yes       Yes                      +----------+------------+---------+-----------+----------+-------+  Summary:  Right: No evidence of deep vein thrombosis in the upper extremity. No evidence of superficial vein thrombosis in the upper extremity. Noncompressible vessels are due to the AVF. An area of mixed echogenicity is noted adjacent to the AVF in the mid upper arm.  Left: No evidence of thrombosis in the subclavian.  *See table(s) above for measurements and observations.  Diagnosing physician: Deitra Mayo MD Electronically signed by Deitra Mayo MD on 03/08/2021 at 8:19:12 PM.    Final     Scheduled Meds: . sodium chloride   Intravenous Once  . sodium chloride   Intravenous Once  . amiodarone  200 mg Oral Daily  . calcitRIOL  0.5 mcg Oral Q T,Th,Sa-HD  . calcium acetate  667 mg Oral TID WC  . chlorhexidine  15 mL Mouth Rinse BID  . Chlorhexidine Gluconate Cloth  6 each Topical Q0600  . feeding supplement (PROSource TF)  45 mL Per Tube Daily  . free water  50 mL Per Tube Q6H  . levETIRAcetam  500 mg Per Tube BID  . mouth rinse  15 mL Mouth Rinse q12n4p  . metoprolol tartrate  2.5 mg Intravenous Q8H  . midodrine  10 mg Oral Q T,Th,Sa-HD  . midodrine  5 mg Oral BID WC  . multivitamin  1 tablet Per Tube QHS  . pantoprazole  sodium  40 mg Per Tube Daily  . rosuvastatin  10 mg Oral Daily   Continuous Infusions: . sodium chloride    . doxycycline (VIBRAMYCIN) IV 100 mg (03/09/21 1625)  . feeding supplement (OSMOLITE 1.5 CAL) 45 mL/hr at 03/09/21 1717    LOS: 9 days   Kerney Elbe, DO [facility] PAGER is on [place]  If 7PM-7AM, please contact night-coverage [email or web address]

## 2021-03-09 NOTE — Progress Notes (Signed)
   03/09/21 1120  Vitals  Temp 97.9 F (36.6 C)  Temp Source Oral  BP (!) 138/59  BP Location Left Arm  BP Method Automatic  Patient Position (if appropriate) Lying  Pulse Rate 79  Pulse Rate Source Monitor  Oxygen Therapy  SpO2 97 %  O2 Device Room Air  Pain Assessment  Pain Scale Faces  Pain Score 0  Dialysis Weight  Weight 38.6 kg  Type of Weight Post-Dialysis  Post-Hemodialysis Assessment  Rinseback Volume (mL) 250 mL  KECN 236 V  Dialyzer Clearance Lightly streaked  Duration of HD Treatment -hour(s) 4 hour(s)  Hemodialysis Intake (mL) 600 mL  UF Total -Machine (mL) 2479 mL  Net UF (mL) 1879 mL  Tolerated HD Treatment Yes  Post-Hemodialysis Comments tx complete-pt stable  Hemodialysis Catheter Right Internal jugular Double lumen Permanent (Tunneled)  Placement Date/Time: 10/15/20 1120   Placed prior to admission: No  Time Out: Correct patient;Correct site;Correct procedure  Maximum sterile barrier precautions: Hand hygiene;Sterile gloves;Cap;Large sterile sheet;Mask;Sterile gown  Site Prep: Chlorh...  Site Condition No complications  Blue Lumen Status Flushed;Heparin locked;Capped (Central line)  Red Lumen Status Flushed;Capped (Central line);Heparin locked  Catheter fill solution Heparin 1000 units/ml  Catheter fill volume (Arterial) 1.6 cc  Catheter fill volume (Venous) 1.6  Dressing Type Occlusive  Dressing Status Clean;Dry;Intact  Antimicrobial disc in place? Yes  Interventions Dressing changed;Antimicrobial disc changed;New dressing  Drainage Description None  Dressing Change Due 03/16/21  Post treatment catheter status Capped and Clamped  Tx complete-pt stable. Tx paused for approximately 30 min due to SBP-70s-80s, 100 ml NS bolused tx resumed.

## 2021-03-09 NOTE — Progress Notes (Addendum)
[facility] Progress Note   Subjective:    Seen in HD unit. SBP 170/71 at start of HD  Had Cortrak placed per nutrition for tube feeds  Minimal interaction, nods head   Objective Vitals:   03/08/21 2047 03/08/21 2119 03/09/21 0416 03/09/21 0634  BP: (!) 95/54 (!) 141/70 133/63 (!) 144/67  Pulse:  77 72 76  Resp: '19 18 16 20  '$ Temp: 98.2 F (36.8 C)  98.1 F (36.7 C)   TempSrc: Axillary  Axillary   SpO2: 98% 99% 99% 100%  Weight:      Height:       Physical Exam General:chronically ill appearing, NGT in place   Heart: RRR Lungs:CTAB, nml WOB Abdomen:soft, NTND Extremities:no LE edema Dialysis Access: [facility], RU AVF +b/t   Filed Weights   03/07/21 0429 03/07/21 0742 03/07/21 1130  Weight: 39.5 kg 39.4 kg 37.5 kg    Intake/Output Summary (Last 24 hours) at 03/09/2021 0744 Last data filed at 03/08/2021 2311 Gross per 24 hour  Intake 579.17 ml  Output --  Net 579.17 ml    Additional Objective Labs: Basic Metabolic Panel: Recent Labs  Lab 03/07/21 0816 03/08/21 0253 03/09/21 0102  NA 135 134* 134*  K 4.3 3.9 4.2  CL 98 98 100  CO2 '27 30 29  '$ GLUCOSE 59* 115* 114*  BUN 23* 15 25*  CREATININE 5.36* 3.67* 4.33*  CALCIUM 9.6 9.6 9.5  PHOS 4.4 3.9 4.0   Liver Function Tests: Recent Labs  Lab 03/07/21 0252 03/07/21 0816 03/08/21 0253 03/09/21 0102  AST 12*  --  12* 16  ALT 8  --  8 9  ALKPHOS 51  --  50 49  BILITOT 0.7  --  0.4 0.4  PROT 5.0*  --  5.1* 4.8*  ALBUMIN 1.5* 1.5* 1.5* 1.4*   CBC: Recent Labs  Lab 03/06/21 0117 03/07/21 0252 03/07/21 0826 03/08/21 0253 03/09/21 0102  WBC 5.9 5.9 6.3 5.4 5.6  NEUTROABS 4.5 4.7  --  4.0 4.5  HGB 13.0 12.9 12.3 11.9* 11.7*  HCT 40.7 40.1 39.2 36.9 37.3  MCV 87.2 86.1 87.7 86.4 88.2  PLT PLATELET CLUMPS NOTED ON SMEAR, UNABLE TO ESTIMATE PLATELET CLUMPS NOTED ON SMEAR, COUNT APPEARS DECREASED 118* 85* 91*   Blood Culture    Component Value Date/Time   SDES BLOOD LEFT HAND 03/01/2021  0046   SPECREQUEST  03/01/2021 0046    BOTTLES DRAWN AEROBIC AND ANAEROBIC Blood Culture results may not be optimal due to an inadequate volume of blood received in culture bottles   CULT  03/01/2021 0046    NO GROWTH 5 DAYS Performed at [facility], [address] [place], [place] [address]    REPTSTATUS 03/06/2021 FINAL 03/01/2021 0046    CBG: Recent Labs  Lab 03/08/21 0329 03/08/21 2330 03/09/21 0410  GLUCAP 107* 107* 132*    Medications: . sodium chloride    . doxycycline (VIBRAMYCIN) IV 100 mg (03/09/21 0634)  . feeding supplement (OSMOLITE 1.5 CAL) 35 mL/hr at 03/09/21 0630  . levETIRAcetam 500 mg (03/08/21 2128)   . (feeding supplement) PROSource Plus  30 mL Oral BID BM  . sodium chloride   Intravenous Once  . sodium chloride   Intravenous Once  . amiodarone  200 mg Oral Daily  . calcitRIOL  0.5 mcg Oral Q T,Th,Sa-HD  . calcium acetate  667 mg Oral TID WC  . chlorhexidine  15 mL Mouth Rinse BID  . Chlorhexidine Gluconate Cloth  6 each Topical Q0600  . feeding supplement (PROSource TF)  45 mL Per Tube Daily  . mouth rinse  15 mL Mouth Rinse q12n4p  . metoprolol tartrate  2.5 mg Intravenous Q8H  . midodrine  10 mg Oral Q T,Th,Sa-HD  . midodrine  5 mg Oral BID WC  . multivitamin  1 tablet Per Tube QHS  . pantoprazole (PROTONIX) IV  40 mg Intravenous Q24H  . rosuvastatin  10 mg Oral Daily    Dialysis Orders: TTS [facility] 4h 400/500 37.5kg 2/2 bath RUA AVF/ RIJ TDC - sensipar 90 po tiw, on hold from 4/14 - mircera 150 q2 last 4.28 - calcitriol 1.25 ug tiw  Assessment/Plan: 1. Shock- acute,hypotension event very shortly after initiating HD, BP's were in the70'sat HD unit. Sent to ED.Noted drop in hgb in ED, pRBC given.Continues to have hypotension with HD, midodrine added on dialysis days. 2. AMS - 2/2 seizure activity vs hypotension.  3. R arm pain/infiltration- Seen by VVS,infiltration noted but not enough bleeding to be cause of drop in Hgb.  Plan to rest AVF x 2wks. Hb improved now at11.9s/p total 3 units since admission. 4. ESRD- on HD TTS.HD today on schedule  5. BP/ volume -BP elevated this AM. MidodrineBID with extra does pre HD - not given today, may be able to d/c if BP remain improved.On admit Vol overloaded on exam and by CT w/ significant ascites and areas of localized pitting edema.  Localized pitting edema improving, continue to titrate down volume as tolerated.    6. Seizures- takes keppra 7. Atrial Fib- on amio, holdingBBw/ low BP's. Not on AC due to prior ICH, hx of anemia 8. Anemia ckd- aranesp dose given on 5/14/22Now s/p 3units of blood since admit and improved to goal.  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 10. Nutrition -changed to dysphagia diet due to spitting out food.  Continues to have poor intake -Cortrak placed 5/20. TF +protein supplements per RD    Lynnda Child PA-C [facility] 03/09/2021,7:45 AM  Nephrology attending: The patient was seen and examined in dialysis unit.  Chart reviewed.  I agree with assessment plan as outlined above. Tolerating HD well. On tube feed.  UF as tolerated, BP ok.  Hemoglobin at goal.  Katheran James, MD [facility].

## 2021-03-09 NOTE — Progress Notes (Signed)
Nutrition Follow-up  DOCUMENTATION CODES:   Underweight,Severe malnutrition in context of chronic illness  INTERVENTION:   -Continue renal MVI (via tube) -Continue feeding assistance with meals -Continue Magic cup TID with meals, each supplement provides 290 kcal and 9 grams of protein  -Continue Osmolite 1.5 @ 35 ml/hr via cortrak tube and increase by 10 ml every 12 hours to goal rate of 45 ml/hr.   45 ml Prosource TF daily    50 ml free water flush every 6 hours  Tube feeding regimen provides 1660 kcal (100% of needs), 79 grams of protein, and 823 ml of H2O. Total free water: 1023 ml daily  -Monitor Mg, K, and Phos daily and replete as needed secondary to high refeeding risk  NUTRITION DIAGNOSIS:   Severe Malnutrition related to chronic illness (ESRD on HD) as evidenced by moderate fat depletion,severe fat depletion,moderate muscle depletion,severe muscle depletion.  Ongoing  GOAL:   Patient will meet greater than or equal to 90% of their needs  Progressing  MONITOR:   PO intake,Supplement acceptance,Labs,Weight trends,TF tolerance,Skin,I & O's  REASON FOR ASSESSMENT:   Consult Calorie Count,Assessment of nutrition requirement/status  ASSESSMENT:   60 yo female admitted with hypovolemic shock, acute blood loss from AV fistula, AMS, questionable seizure activity at HD center. PMH includes ESRD on HD, HTN, diastolic HF, CAD, PAF, CVA/SAH s/p VP shunt.  5/16- s/p BSE- recommending regular consistency diet with thin liquids 5/18- s/p BSE- downgraded to dysphagia 2 diet with thin liquids 5/20- cortrak placed (gastric), TF initiated   Reviewed I/O's: +579 ml x 24 hours and -1 L since admission Pt in HD at time of visit.   Pt receiving TF via cortrak tube. Tolerating well and refeeding labs WDL. Pt currently receiving Osmolite 1.5 @ 35 ml/hr (with 45 ml Prosource Plus daily) via cortrak- currently providing 1300 kcals, 64 grams protein, and 640 ml free water  daily, meeting 87% of estimated kcal needs and 85% of estimated protein needs.  Medications reviewed and include phoslo and keppra.  Labs reviewed: Na: 134, Mg, K, and Phos WDL. CBGS: 107-132 (inpatient orders for glycemic control are none).   Diet Order:   Diet Order            DIET DYS 2 Room service appropriate? Yes with Assist; Fluid consistency: Thin  Diet effective now                 EDUCATION NEEDS:   Not appropriate for education at this time  Skin:  Skin Assessment: Skin Integrity Issues: Skin Integrity Issues:: Stage II Stage II: sacrum  Last BM:  03/07/21  Height:   Ht Readings from Last 1 Encounters:  03/04/21 5' 0.98" (1.549 m)    Weight:   Wt Readings from Last 1 Encounters:  03/09/21 40.1 kg    Ideal Body Weight:  47.7 kg  BMI:  Body mass index is 16.71 kg/m.  Estimated Nutritional Needs:   Kcal:  1500-1700  Protein:  75-95 grams  Fluid:  1000 ml + UOP    Loistine Chance, RD, LDN, CDCES Registered Dietitian II Certified Diabetes Care and Education Specialist Please refer to [facility] for RD and/or RD on-call/weekend/after hours pager

## 2021-03-09 NOTE — Plan of Care (Signed)
MRI would not be possible due to multiple contraindications status VP shunt, coils- indeterminate if they are safe. An MRI is not going to really change much in terms of her outcome. Recommendations as in the consult note from yesterday. Management of toxic metabolic derangements primary team as you are Neurology will be available as needed. Discussed the plan in detail with Dr. Shelton Silvas.  -- Diane Portland, MD Neurologist [facility] Pager: [phone]

## 2021-03-10 DIAGNOSIS — R638 Other symptoms and signs concerning food and fluid intake: Secondary | ICD-10-CM | POA: Diagnosis not present

## 2021-03-10 DIAGNOSIS — R627 Adult failure to thrive: Secondary | ICD-10-CM | POA: Diagnosis not present

## 2021-03-10 DIAGNOSIS — R5381 Other malaise: Secondary | ICD-10-CM | POA: Diagnosis not present

## 2021-03-10 LAB — CBC WITH DIFFERENTIAL/PLATELET
Abs Immature Granulocytes: 0.02 10*3/uL (ref 0.00–0.07)
Basophils Absolute: 0 10*3/uL (ref 0.0–0.1)
Basophils Relative: 1 %
Eosinophils Absolute: 0 10*3/uL (ref 0.0–0.5)
Eosinophils Relative: 0 %
HCT: 34.8 % — ABNORMAL LOW (ref 36.0–46.0)
Hemoglobin: 11.2 g/dL — ABNORMAL LOW (ref 12.0–15.0)
Immature Granulocytes: 0 %
Lymphocytes Relative: 13 %
Lymphs Abs: 0.7 10*3/uL (ref 0.7–4.0)
MCH: 28.1 pg (ref 26.0–34.0)
MCHC: 32.2 g/dL (ref 30.0–36.0)
MCV: 87.2 fL (ref 80.0–100.0)
Monocytes Absolute: 0.3 10*3/uL (ref 0.1–1.0)
Monocytes Relative: 5 %
Neutro Abs: 4.3 10*3/uL (ref 1.7–7.7)
Neutrophils Relative %: 81 %
Platelets: 79 10*3/uL — ABNORMAL LOW (ref 150–400)
RBC: 3.99 MIL/uL (ref 3.87–5.11)
RDW: 18 % — ABNORMAL HIGH (ref 11.5–15.5)
WBC: 5.3 10*3/uL (ref 4.0–10.5)
nRBC: 0 % (ref 0.0–0.2)

## 2021-03-10 LAB — COMPREHENSIVE METABOLIC PANEL
ALT: 10 U/L (ref 0–44)
AST: 16 U/L (ref 15–41)
Albumin: 1.4 g/dL — ABNORMAL LOW (ref 3.5–5.0)
Alkaline Phosphatase: 50 U/L (ref 38–126)
Anion gap: 9 (ref 5–15)
BUN: 23 mg/dL — ABNORMAL HIGH (ref 6–20)
CO2: 28 mmol/L (ref 22–32)
Calcium: 9.2 mg/dL (ref 8.9–10.3)
Chloride: 99 mmol/L (ref 98–111)
Creatinine, Ser: 2.97 mg/dL — ABNORMAL HIGH (ref 0.44–1.00)
GFR, Estimated: 18 mL/min — ABNORMAL LOW (ref >60)
Glucose, Bld: 141 mg/dL — ABNORMAL HIGH (ref 70–99)
Potassium: 4.3 mmol/L (ref 3.5–5.1)
Sodium: 136 mmol/L (ref 135–145)
Total Bilirubin: 0.3 mg/dL (ref 0.3–1.2)
Total Protein: 5 g/dL — ABNORMAL LOW (ref 6.5–8.1)

## 2021-03-10 LAB — GLUCOSE, CAPILLARY
Glucose-Capillary: 114 mg/dL — ABNORMAL HIGH (ref 70–99)
Glucose-Capillary: 129 mg/dL — ABNORMAL HIGH (ref 70–99)
Glucose-Capillary: 127 mg/dL — ABNORMAL HIGH (ref 70–99)
Glucose-Capillary: 111 mg/dL — ABNORMAL HIGH (ref 70–99)

## 2021-03-10 LAB — PHOSPHORUS: Phosphorus: 1.6 mg/dL — ABNORMAL LOW (ref 2.5–4.6)

## 2021-03-10 LAB — MAGNESIUM: Magnesium: 1.8 mg/dL (ref 1.7–2.4)

## 2021-03-10 MED ORDER — HYDROCODONE-ACETAMINOPHEN 7.5-325 MG/15ML PO SOLN: 10.0000 mL | ORAL | Status: DC | PRN

## 2021-03-10 NOTE — Progress Notes (Signed)
[facility] Progress Note   Subjective:    Completed dialysis yesterday net UF 1.8L Hypotensive event on HD.  Seen in room. Awake, no new complaints. Nods head.   Objective Vitals:   03/09/21 1226 03/09/21 2020 03/09/21 2206 03/10/21 0418  BP: 135/67 (!) 145/72 (!) 141/69 133/65  Pulse: 78 85  94  Resp:  19 (!) 22 19  Temp:  99.1 F (37.3 C)  99.7 F (37.6 C)  TempSrc:  Oral  Oral  SpO2: 98% 100%  99%  Weight:    40 kg  Height:       Physical Exam General: chronically ill appearing, NGT in place   Heart: RRR Lungs: CTAB, nml WOB Abdomen: soft, NTND Extremities: no LE edema Dialysis Access: TDC, RU AVF +b/t, RUE swelling  Filed Weights   03/09/21 0715 03/09/21 1120 03/10/21 0418  Weight: 40.1 kg 38.6 kg 40 kg    Intake/Output Summary (Last 24 hours) at 03/10/2021 0908 Last data filed at 03/09/2021 2207 Gross per 24 hour  Intake 1457.87 ml  Output 1879 ml  Net -421.13 ml    Additional Objective Labs: Basic Metabolic Panel: Recent Labs  Lab 03/08/21 0253 03/09/21 0102 03/10/21 0103  NA 134* 134* 136  K 3.9 4.2 4.3  CL 98 100 99  CO2 '30 29 28  '$ GLUCOSE 115* 114* 141*  BUN 15 25* 23*  CREATININE 3.67* 4.33* 2.97*  CALCIUM 9.6 9.5 9.2  PHOS 3.9 4.0 1.6*   Liver Function Tests: Recent Labs  Lab 03/08/21 0253 03/09/21 0102 03/10/21 0103  AST 12* 16 16  ALT '8 9 10  '$ ALKPHOS 50 49 50  BILITOT 0.4 0.4 0.3  PROT 5.1* 4.8* 5.0*  ALBUMIN 1.5* 1.4* 1.4*   CBC: Recent Labs  Lab 03/07/21 0252 03/07/21 0826 03/08/21 0253 03/09/21 0102 03/10/21 0103  WBC 5.9 6.3 5.4 5.6 5.3  NEUTROABS 4.7  --  4.0 4.5 4.3  HGB 12.9 12.3 11.9* 11.7* 11.2*  HCT 40.1 39.2 36.9 37.3 34.8*  MCV 86.1 87.7 86.4 88.2 87.2  PLT PLATELET CLUMPS NOTED ON SMEAR, COUNT APPEARS DECREASED 118* 85* 91* 79*   Blood Culture    Component Value Date/Time   SDES BLOOD LEFT HAND 03/01/2021 0046   SPECREQUEST  03/01/2021 0046    BOTTLES DRAWN AEROBIC AND ANAEROBIC Blood  Culture results may not be optimal due to an inadequate volume of blood received in culture bottles   CULT  03/01/2021 0046    NO GROWTH 5 DAYS Performed at [facility], [place] [address], [place], [place] [address]    REPTSTATUS 03/06/2021 FINAL 03/01/2021 0046    CBG: Recent Labs  Lab 03/09/21 1216 03/09/21 1739 03/09/21 2056 03/10/21 0416 03/10/21 0719  GLUCAP 77 95 123* 114* 129*    Medications: . sodium chloride    . doxycycline (VIBRAMYCIN) IV 100 mg (03/10/21 0606)  . feeding supplement (OSMOLITE 1.5 CAL) 1,000 mL (03/09/21 2207)   . sodium chloride   Intravenous Once  . sodium chloride   Intravenous Once  . amiodarone  200 mg Oral Daily  . calcitRIOL  0.5 mcg Oral Q T,Th,Sa-HD  . calcium acetate  667 mg Oral TID WC  . chlorhexidine  15 mL Mouth Rinse BID  . Chlorhexidine Gluconate Cloth  6 each Topical Q0600  . feeding supplement (PROSource TF)  45 mL Per Tube Daily  . free water  50 mL Per Tube Q6H  . levETIRAcetam  500 mg Per Tube BID  .  mouth rinse  15 mL Mouth Rinse q12n4p  . metoprolol tartrate  2.5 mg Intravenous Q8H  . midodrine  10 mg Oral Q T,Th,Sa-HD  . midodrine  5 mg Oral BID WC  . multivitamin  1 tablet Per Tube QHS  . pantoprazole sodium  40 mg Per Tube Daily  . rosuvastatin  10 mg Oral Daily    Dialysis Orders: TTS [facility] 4h 400/500 37.5kg 2/2 bath RUA AVF/ RIJ TDC - sensipar 90 po tiw, on hold from 4/14 - mircera 150 q2 last 4.28 - calcitriol 1.25 ug tiw  Assessment/Plan: 1. Shock- acute,hypotension event very shortly after initiating HD, BP's were in the70'sat HD unit. Sent to ED.Noted drop in hgb in ED, pRBC given.Continues to have hypotension with HD, midodrine added on dialysis days. 2. AMS - 2/2 seizure activity vs hypotension. Neurology consulted.  3. R arm pain/infiltration- Seen by VVS,infiltration noted but not enough bleeding to be cause of drop in Hgb. Plan to rest AVF x 2wks. Hb improved now at11.9s/p total  3 units since admission. 4. ESRD- on HD TTS.Next HD 5/24.  5. BP/ volume -BP elevated this AM. MidodrineBID with extra does pre HD -wasn't given 5/21 and had hypotension on HD. On admit Vol overloaded on exam and by CT w/ significant ascites and areas of localized pitting edema.  Localized pitting edema improving, continue to titrate down volume as tolerated.    6. Seizures- takes keppra 7. Atrial Fib- on amiodarone, IV metoprolol. Not on AC due to prior ICH, hx of anemia 8. Anemia ckd- Hgb 11.2 Aranesp dose given on 5/14/22Now s/p 3units of blood since admit and improved to goal.  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 10. Nutrition -changed to dysphagia diet due to spitting out food.  Continues to have poor intake -Cortrak placed 5/20. TF +protein supplements per RD    Lynnda Child PA-C [facility] 03/10/2021,9:08 AM

## 2021-03-10 NOTE — Progress Notes (Signed)
PROGRESS NOTE    [name]  [id number] DOB: 1961/09/27 DOA: 02/28/2021 PCP: Pcp, No  Brief Narrative:  The patient is a extremely thin cachectic and chronically ill-appearing 60 year old African-American female with a past medical history significant for PAD, history of CVA, history of subarachnoid hemorrhage, history of seizures, history of proximal atrial fibrillation, history of chronic urinary issues, hyperlipidemia, hypertension, ESRD on hemodialysis, COPD as well as other comorbidities who presented to ED from her hemodialysis unit after right arm AV fistula infiltrated and her blood pressure dropped.  She became extremely altered and hemoglobin in the ER returned to 7.4 which is down from 13.  She was admitted to the ICU and got 1 unit PRBCs and seen by vascular surgery stabilized and transferred to [facility] on 03/02/2021 with a hemoglobin 6.9.  Subsequently she received 2 more units and hemoglobin improved and is now stable.  Her p.o. and intake has been extremely marginal and poor.  She has failure to thrive and palliative care has been consulted for goals of care discussion however we cannot get in touch with the patient's family members at all.  Nursing stated that she spit out all her medications today and has had a poor appetite again today.  Calorie count done and after discussion with the nutritionist she is unlikely to meet her nutritional needs so we will go ahead and place a Cortrak for tube feeding purposes and reevaluate Monday if her nutritional status is any better while she gets TF.  Her mentation continues to wax and wane and she is not as awake today so Neurology was consulted for further evaluation and Recc's   Assessment & Plan:   Active Problems:   Hemorrhagic shock (HCC)  Acute metabolic encephalopathy and hemodialysis with hypotension/shock and acute drop in hemoglobin -Initially there was a question of her right arm AV fistula played, she also has a history of  underlying seizures and subdural hematoma -She is more somnolent and withdrawn today and nephrology does not feel that she is at her baseline.  She continues have extremely poor p.o. intake and her calorie count reveals that she is not meeting her nutritional needs so we will place a core track for tube feedings -may discuss with neurology about her mental status -She had a hemoglobin of 6.9 initially but this is improved and her last hemoglobin/hematocrit was 13.0/40.7 -A DIC panel was checked including LDH, haptoglobin and occult stool and a CT of the abdomen pelvis was done to rule out any retroperitoneal loss -She received 1 unit of PRBCs in ICU and she was given 2 more units of PRBCs with hemodialysis on 03/02/2021 -Echocardiogram has been checked to ensure valves are stable -Continue to monitor her mentation status carefully and will order a calorie count and a dietitian consult -she continues to be hypotensive with hemodialysis but midodrine has been added back on dialysis days which is improved -Will Check EEG today and Last Head CT was 02/28/21 and showed "Stable postoperative changes. Stable position of ventriculoperitoneal shunt with tip posterior to the third ventricle in the left midbrain region. Stable ventricular enlargement. Multiple prior infarcts, largest in the left parietal lobe region. No acute infarct evident. No mass, hemorrhage, or appreciable extra-axial fluid collections. Multiple foci of arterial vascular calcification evident."  -EEG done and showed the study was suggestive of cortical dysfunction arising from the left frontotemporal region likely secondary to underlying structural abnormalities as well as a mild diffuse encephalopathy which is nonspecific in etiology with no seizures  or epileptiform discharges seen throughout the whole reading. -Will repeat head CT and showed "Stable chronic findings as described above. No acute intracranial abnormality seen." -Her mentation  waxes and wanes and so neurology was consulted for further evaluation and they are pursuing a metabolic work-up but do suspect this is secondary to her poor p.o. intake and failure to thrive. -We will need to continue to have goals of care discussion and will continue her Cortrak tube feedings at this time -Neurology not pursing MRI given her Shunt Coils and feel that the MRI will not change much in terms of her outcome   History of seizures, bilateral SDH and VP shunt -He has no headache and a supple neck and she is continue Keppra which she takes at home but because of her poor p.o. intake we have changes to IV -We will continue monitor closely and there is a question if she had a fluid imbalance induced hypotension causing seizure activity at her dialysis unit causing her AMS -Checked EEG as above -She is not at baseline given that she is more withdrawan so will discuss with Neurology and have them formally consult   Acute anemia superimposed on anemia of chronic kidney disease -It is unclear if she had a lab error that showed a low hemoglobin hematocrit.  After 2 units of PRBCs her hemoglobin trended up to 11 and is now stable at 13 as above -Patient is Hgb/Hct is now slowly dropping and gone from 13.0/40.7 -> 12.9/40.1 ->12.3/39.2 -> 11.9/36.9 and today was 11.7/37.3 -> 11.2/34.8 -CT of the abdomen and pelvis showed no retroperitoneal bleed and the echocardiogram of the abdominal aorta was without any major valvular abnormalities and there is no obvious source of ongoing bleeding -Hemoglobin/hematocrit is now stable -Continue monitor for signs and symptoms of bleeding; no overt bleeding noted  GERD -Changed PPI to IV given her poor p.o. intake  Right arm AV Fistula Infiltration -Seen by vascular surgery and they feel that the infiltration of the hematoma on the right arm is not impressive and this would not profoundly explain her anemia in the plan to rest the fistula for about 2 weeks and  dialyze via a right IJ [facility] -Dr. [name] feels that the aVF may have a pseudoaneurysm but this does not need urgent intervention -Right Upper Extremity Duplex negative for DVT  Severe failure to thrive and deconditioning -PT OT evaluated and recommending SNF next-patient is a very poor p.o. intake so we have consulted nutritionist and have started a calorie count -Palliative care has been called for goals of care discussion question -SLP has been consulted for evaluation and they have placed her on a dysphagia 2 diet with thin liquid; her diet has been changed to dysphagia diet due to her spitting out food and meds -Continue with protein calorie supplements and calorie count; Patient failed Calorie Count so will place Cortrak and start TF -Prevalon boots have been added -Continue with tube feedings as she only 85% of her nutritional goals via tube feedings  ESRD on Tuesday Thursday Saturday  -Nephrology is following and patient got dialyzed through a right IJ dialysis catheter since her right AV fistula has infiltrated and she has been seen by the vascular surgery.  If she has been repaired and they are recommending rest at this time -Patient's BUN/Cr went from 22/4.98 -> 23/5.36 -> 15/3.67 -> 25/4.33 -> 11.2/34.8 -continue with calcitriol 0.5 mcg every Tuesday Thursday Saturday and continue with calcium acetate 667 p.o. 3 times daily with  meals  -Continue to Monitor and Trend Renal Fxn carefully -Repeat CMP in the AM -Appreciate nephrology evaluation and recommendations as she is tolerating hemodialysis well and having ultrafiltration as tolerated  Proximal Atrial Fibrillation -Continue with Metoprolol for rate control and Amiodarone -She is not a candidate for anticoagulation given her intracranial hemorrhage/subarachnoid hemorrhage/anemia -Head CT done as above; may pursue MRI but will await neurology recommendations  History of right lower extremity DVT -Age is indeterminate and she is  not a candidate for anticoagulation due to her subdural bleeds and previous admissions IR was consulted for IVC filter but this was not recommended at that time. -Continue monitor  Hypothyroidism -She was on a beta-blocker and apparently methimazole discontinued last admission -She has a stable TSH and she needs outpatient endocrine follow-up at discharge  Anasarca with ascites and effusion due to severe protein calorie malnutrition and extremely poor nutritional status -She is asymptomatic and she is on supplements  -Nutritionist consulted for further evaluation and she is now getting calorie count and because she did not pass will get a Cortrak  Goals of care -Her baseline quality of life seems to be extremely poor due to her brain injury and extremely poor nutritional status as well as multiple comorbidities including ESRD.  She is extremely poor functional status and we have consulted palliative care for goals of care consultation and discussion -Unable to reach family again today and Palliative recommending outpatient Palliative now -Will place Cortrak given inadequate Nutritional Intake and will need to Re-evaluate with GOC discussion once able to get in touch with family   -Patient needs to go back to SNF but cannot go to SNF with a core track tube in place will need to figure out further goals of care   Hypertension -On nondialysis days her systolics are between [id number] and 140 requiring low-dose beta-blockers however she extremely sensitive to fluid shifts and on the days of dialysis her systolic falls below 80.  She gets midodrine during the dialysis times and nephrology is following closely -Continue monitor blood pressures per protocol neurology is trying to maximize ultrafiltration and titrate down volume as tolerated -Last BP was 154/68  Hyperlipidemia -Continue Rosuvastatin 10 mg p.o. daily if able to tolerate p.o.  DVT prophylaxis: SCDs Code Status: FULL CODE  Family  Communication: No family present at bedside  Disposition Plan: Pending further clinical improvement and tolerance of the diet and insurance that she is adequately taking in enough nutrition for safe discharge disposition; She will need SNF at D/C   Status is: Inpatient  Remains inpatient appropriate because:Unsafe d/c plan, IV treatments appropriate due to intensity of illness or inability to take PO and Inpatient level of care appropriate due to severity of illness   Dispo: The patient is from: Home              Anticipated d/c is to: SNF              Patient currently is not medically stable to d/c.   Difficult to place patient No  Consultants:   Nephrology  Palliative Care Medicine   Neurology    Procedures: Hemodialysis  Antimicrobials:  Anti-infectives (From admission, onward)   Start     Dose/Rate Route Frequency Ordered Stop   03/08/21 1815  doxycycline (VIBRAMYCIN) 100 mg in sodium chloride 0.9 % 250 mL IVPB        100 mg 125 mL/hr over 120 Minutes Intravenous Every 12 hours 03/08/21 1722     03/02/21  1200  vancomycin (VANCOCIN) IVPB 500 mg/100 ml premix  Status:  Discontinued        500 mg 100 mL/hr over 60 Minutes Intravenous Every T-Th-Sa (Hemodialysis) 02/28/21 1747 03/01/21 0824   03/01/21 1800  ceFEPIme (MAXIPIME) 1 g in sodium chloride 0.9 % 100 mL IVPB  Status:  Discontinued        1 g 200 mL/hr over 30 Minutes Intravenous Every 24 hours 02/28/21 1747 03/01/21 0824   02/28/21 1800  vancomycin (VANCOREADY) IVPB 1000 mg/200 mL        1,000 mg 200 mL/hr over 60 Minutes Intravenous  Once 02/28/21 1716 02/28/21 1957   02/28/21 1730  ceFEPIme (MAXIPIME) 2 g in sodium chloride 0.9 % 100 mL IVPB        2 g 200 mL/hr over 30 Minutes Intravenous  Once 02/28/21 1716 02/28/21 1855   02/28/21 1730  metroNIDAZOLE (FLAGYL) IVPB 500 mg        500 mg 100 mL/hr over 60 Minutes Intravenous  Once 02/28/21 1716 02/28/21 1938        Subjective: Seen and examined at bedside  and is a little more awake and alert. No nausea and no pain. Denied any other concerns or complaints at this time.   Objective: Vitals:   03/09/21 1226 03/09/21 2020 03/09/21 2206 03/10/21 0418  BP: 135/67 (!) 145/72 (!) 141/69 133/65  Pulse: 78 85  94  Resp:  19 (!) 22 19  Temp:  99.1 F (37.3 C)  99.7 F (37.6 C)  TempSrc:  Oral  Oral  SpO2: 98% 100%  99%  Weight:    40 kg  Height:        Intake/Output Summary (Last 24 hours) at 03/10/2021 1533 Last data filed at 03/09/2021 2207 Gross per 24 hour  Intake 1457.87 ml  Output --  Net 1457.87 ml   Filed Weights   03/09/21 0715 03/09/21 1120 03/10/21 0418  Weight: 40.1 kg 38.6 kg 40 kg   Examination: Physical Exam:  Constitutional: She is a thin frail and cachectic African-American female who is little bit more awake today and slightly confused but answers questions appropriately. Eyes: Lids and conjunctivae normal, sclerae anicteric  ENMT: External Ears, Nose appear normal. Grossly normal hearing.  Has a NG tube in place getting tube feedings Neck: Appears normal, supple, no cervical masses, normal ROM, no appreciable thyromegaly; no JVD Respiratory: Diminished to auscultation bilaterally with coarse breath sounds, no wheezing, rales, rhonchi or crackles. Normal respiratory effort and patient is not tachypenic. No accessory muscle use.  Unlabored breathing Cardiovascular: RRR, no murmurs / rubs / gallops. S1 and S2 auscultated. No extremity edema. 2+ pedal pulses. No carotid bruits.  Abdomen: Soft, non-tender, non-distended. Bowel sounds positive.  GU: Deferred. Musculoskeletal: No clubbing / cyanosis of digits/nails. No joint deformity upper and lower extremities.  Skin: No rashes, lesions, ulcers on limited skin evaluation. No induration; Warm and dry.  Neurologic: CN 2-12 grossly intact with no focal deficits. Romberg sign and cerebellar reflexes not assessed.  Psychiatric: Impaired judgment and insight. She is awake Alert  but not fully oriented. Slightly depressed and appropriate affect.   Data Reviewed: I have personally reviewed following labs and imaging studies  CBC: Recent Labs  Lab 03/06/21 0117 03/07/21 0252 03/07/21 0826 03/08/21 0253 03/09/21 0102 03/10/21 0103  WBC 5.9 5.9 6.3 5.4 5.6 5.3  NEUTROABS 4.5 4.7  --  4.0 4.5 4.3  HGB 13.0 12.9 12.3 11.9* 11.7* 11.2*  HCT 40.7 40.1 39.2  36.9 37.3 34.8*  MCV 87.2 86.1 87.7 86.4 88.2 87.2  PLT PLATELET CLUMPS NOTED ON SMEAR, UNABLE TO ESTIMATE PLATELET CLUMPS NOTED ON SMEAR, COUNT APPEARS DECREASED 118* 85* 91* 79*   Basic Metabolic Panel: Recent Labs  Lab 03/07/21 0252 03/07/21 0816 03/08/21 0253 03/09/21 0102 03/10/21 0103  NA 136 135 134* 134* 136  K 4.2 4.3 3.9 4.2 4.3  CL 99 98 98 100 99  CO2 '27 27 30 29 28  '$ GLUCOSE 65* 59* 115* 114* 141*  BUN 22* 23* 15 25* 23*  CREATININE 4.98* 5.36* 3.67* 4.33* 2.97*  CALCIUM 10.0 9.6 9.6 9.5 9.2  MG 2.1  --  2.1 2.2 1.8  PHOS 4.2 4.4 3.9 4.0 1.6*   GFR: Estimated Creatinine Clearance: 13 mL/min (A) (by C-G formula based on SCr of 2.97 mg/dL (H)). Liver Function Tests: Recent Labs  Lab 03/06/21 0117 03/07/21 0252 03/07/21 0816 03/08/21 0253 03/09/21 0102 03/10/21 0103  AST 15 12*  --  12* 16 16  ALT 7 8  --  '8 9 10  '$ ALKPHOS 51 51  --  50 49 50  BILITOT 0.6 0.7  --  0.4 0.4 0.3  PROT 4.9* 5.0*  --  5.1* 4.8* 5.0*  ALBUMIN 1.5* 1.5* 1.5* 1.5* 1.4* 1.4*   No results for input(s): LIPASE, AMYLASE in the last 168 hours. Recent Labs  Lab 03/08/21 1801  AMMONIA 16   Coagulation Profile: No results for input(s): INR, PROTIME in the last 168 hours. Cardiac Enzymes: No results for input(s): CKTOTAL, CKMB, CKMBINDEX, TROPONINI in the last 168 hours. BNP (last 3 results) No results for input(s): PROBNP in the last 8760 hours. HbA1C: No results for input(s): HGBA1C in the last 72 hours. CBG: Recent Labs  Lab 03/09/21 1739 03/09/21 2056 03/10/21 0416 03/10/21 0719 03/10/21 1138   GLUCAP 95 123* 114* 129* 127*   Lipid Profile: No results for input(s): CHOL, HDL, LDLCALC, TRIG, CHOLHDL, LDLDIRECT in the last 72 hours. Thyroid Function Tests: Recent Labs    03/08/21 1801  TSH 1.635   Anemia Panel: Recent Labs    03/08/21 1801  VITAMINB12 774  FOLATE 6.7   Sepsis Labs: No results for input(s): PROCALCITON, LATICACIDVEN in the last 168 hours.  Recent Results (from the past 240 hour(s))  Resp Panel by RT-PCR (Flu A&B, Covid) Nasopharyngeal Swab     Status: None   Collection Time: 02/28/21  5:26 PM   Specimen: Nasopharyngeal Swab; Nasopharyngeal(NP) swabs in vial transport medium  Result Value Ref Range Status   SARS Coronavirus 2 by RT PCR NEGATIVE NEGATIVE Final    Comment: (NOTE) SARS-CoV-2 target nucleic acids are NOT DETECTED.  The SARS-CoV-2 RNA is generally detectable in upper respiratory specimens during the acute phase of infection. The lowest concentration of SARS-CoV-2 viral copies this assay can detect is 138 copies/mL. A negative result does not preclude SARS-Cov-2 infection and should not be used as the sole basis for treatment or other patient management decisions. A negative result may occur with  improper specimen collection/handling, submission of specimen other than nasopharyngeal swab, presence of viral mutation(s) within the areas targeted by this assay, and inadequate number of viral copies(<138 copies/mL). A negative result must be combined with clinical observations, patient history, and epidemiological information. The expected result is Negative.  Fact Sheet for Patients:  EntrepreneurPulse.com.au  Fact Sheet for Healthcare Providers:  IncredibleEmployment.be  This test is no t yet approved or cleared by the [place] and  has been authorized  for detection and/or diagnosis of SARS-CoV-2 by FDA under an Emergency Use Authorization (EUA). This EUA will remain  in effect (meaning  this test can be used) for the duration of the COVID-19 declaration under Section 564(b)(1) of the Act, 21 U.S.C.section 360bbb-3(b)(1), unless the authorization is terminated  or revoked sooner.       Influenza A by PCR NEGATIVE NEGATIVE Final   Influenza B by PCR NEGATIVE NEGATIVE Final    Comment: (NOTE) The Xpert Xpress SARS-CoV-2/FLU/RSV plus assay is intended as an aid in the diagnosis of influenza from Nasopharyngeal swab specimens and should not be used as a sole basis for treatment. Nasal washings and aspirates are unacceptable for Xpert Xpress SARS-CoV-2/FLU/RSV testing.  Fact Sheet for Patients: EntrepreneurPulse.com.au  Fact Sheet for Healthcare Providers: IncredibleEmployment.be  This test is not yet approved or cleared by the [place] FDA and has been authorized for detection and/or diagnosis of SARS-CoV-2 by FDA under an Emergency Use Authorization (EUA). This EUA will remain in effect (meaning this test can be used) for the duration of the COVID-19 declaration under Section 564(b)(1) of the Act, 21 U.S.C. section 360bbb-3(b)(1), unless the authorization is terminated or revoked.  Performed at [facility], [place] [address] [place], [place] [address]   MRSA PCR Screening     Status: None   Collection Time: 02/28/21  7:40 PM   Specimen: Nasopharyngeal  Result Value Ref Range Status   MRSA by PCR NEGATIVE NEGATIVE Final    Comment:        The GeneXpert MRSA Assay (FDA approved for NASAL specimens only), is one component of a comprehensive MRSA colonization surveillance program. It is not intended to diagnose MRSA infection nor to guide or monitor treatment for MRSA infections. Performed at [facility], [place] [address], [place], [place] [address]   Culture, blood (single)     Status: None   Collection Time: 03/01/21 12:46 AM   Specimen: BLOOD LEFT HAND  Result Value Ref Range Status   Specimen  Description BLOOD LEFT HAND  Final   Special Requests   Final    BOTTLES DRAWN AEROBIC AND ANAEROBIC Blood Culture results may not be optimal due to an inadequate volume of blood received in culture bottles   Culture   Final    NO GROWTH 5 DAYS Performed at [facility], [place] [address], [place], [place] [address]    Report Status 03/06/2021 FINAL  Final     RN Pressure Injury Documentation: Pressure Injury 11/29/20 Sacrum Posterior Stage 2 -  Partial thickness loss of dermis presenting as a shallow open injury with a red, pink wound bed without slough. (Active)  11/29/20 0400  Location: Sacrum  Location Orientation: Posterior  Staging: Stage 2 -  Partial thickness loss of dermis presenting as a shallow open injury with a red, pink wound bed without slough.  Wound Description (Comments):   Present on Admission: Yes    Estimated body mass index is 16.67 kg/m as calculated from the following:   Height as of this encounter: 5' 0.98" (1.549 m).   Weight as of this encounter: 40 kg.  Malnutrition Type:  Nutrition Problem: Severe Malnutrition Etiology: chronic illness (ESRD on HD)   Malnutrition Characteristics:  Signs/Symptoms: moderate fat depletion,severe fat depletion,moderate muscle depletion,severe muscle depletion  Nutrition Interventions:  Interventions: Tube feeding   Radiology Studies: CT HEAD WO CONTRAST  Result Date: 03/08/2021 CLINICAL DATA:  Altered mental status. EXAM: CT HEAD WITHOUT CONTRAST TECHNIQUE:  Contiguous axial images were obtained from the base of the skull through the vertex without intravenous contrast. COMPARISON:  Feb 28, 2021. FINDINGS: Brain: Stable left posterior parietal encephalomalacia. Stable old left cerebellar infarction. Right frontal ventriculostomy catheter is noted with distal tip in the third ventricle. Stable ventricular size is noted compared to prior exam. No midline shift is noted. No acute infarction or hemorrhage is noted.  Vascular: No hyperdense vessel or unexpected calcification. Skull: Status post right frontal craniotomy. No acute abnormality is noted. Sinuses/Orbits: No acute finding. Other: None. IMPRESSION: Stable chronic findings as described above. No acute intracranial abnormality seen. Electronically Signed   By: Marijo Conception M.D.   On: 03/08/2021 18:29   EEG adult  Result Date: 03/08/2021 Lora Havens, MD     03/08/2021  4:47 PM Patient Name: [name] MRN: [id number] Epilepsy Attending: Lora Havens Referring Physician/Provider: Dr Carlisle Cater Date: 03/08/2021 Duration: 23.45 mins Patient history: 60yo F with ams. EEG to evaluate for seizur Level of alertness: Awake, asleep AEDs during EEG study: LEV Technical aspects: This EEG study was done with scalp electrodes positioned according to the 10-20 International system of electrode placement. Electrical activity was acquired at a sampling rate of '500Hz'$  and reviewed with a high frequency filter of '70Hz'$  and a low frequency filter of '1Hz'$ . EEG data were recorded continuously and digitally stored. Description: The posterior dominant rhythm consists of 7.5 Hz activity of moderate voltage (25-35 uV) seen predominantly in posterior head regions, symmetric and reactive to eye opening and eye closing. Sleep was characterized by vertex waves, sleep spindles (12 to 14 Hz), maximal frontocentral region.  EEG showed continuous generalized and maximal left frontotemporal region theta-delta slowing.  Hyperventilation and photic stimulation were not performed.   ABNORMALITY - Continuous slow, generalized and maximal left frontotemporal region IMPRESSION: This study is suggestive of cortical dysfunction arising from left fronto-temporal region likely secondary to underlying structural abnormality as well as mild diffuse encephalopathy, nonspecific etiology. No seizures or epileptiform discharges were seen throughout the recording. Priyanka Barbra Sarks   Scheduled  Meds: . sodium chloride   Intravenous Once  . sodium chloride   Intravenous Once  . amiodarone  200 mg Oral Daily  . calcitRIOL  0.5 mcg Oral Q T,Th,Sa-HD  . chlorhexidine  15 mL Mouth Rinse BID  . Chlorhexidine Gluconate Cloth  6 each Topical Q0600  . feeding supplement (PROSource TF)  45 mL Per Tube Daily  . free water  50 mL Per Tube Q6H  . levETIRAcetam  500 mg Per Tube BID  . mouth rinse  15 mL Mouth Rinse q12n4p  . metoprolol tartrate  2.5 mg Intravenous Q8H  . midodrine  10 mg Oral Q T,Th,Sa-HD  . midodrine  5 mg Oral BID WC  . multivitamin  1 tablet Per Tube QHS  . pantoprazole sodium  40 mg Per Tube Daily  . rosuvastatin  10 mg Oral Daily   Continuous Infusions: . sodium chloride    . doxycycline (VIBRAMYCIN) IV 100 mg (03/10/21 0606)  . feeding supplement (OSMOLITE 1.5 CAL) 1,000 mL (03/09/21 2207)    LOS: 10 days   Kerney Elbe, DO [facility] PAGER is on [place]  If 7PM-7AM, please contact night-coverage [email or web address]

## 2021-03-11 DIAGNOSIS — R5381 Other malaise: Secondary | ICD-10-CM | POA: Diagnosis not present

## 2021-03-11 DIAGNOSIS — R638 Other symptoms and signs concerning food and fluid intake: Secondary | ICD-10-CM | POA: Diagnosis not present

## 2021-03-11 DIAGNOSIS — R627 Adult failure to thrive: Secondary | ICD-10-CM | POA: Diagnosis not present

## 2021-03-11 LAB — CBC WITH DIFFERENTIAL/PLATELET
Abs Immature Granulocytes: 0.02 10*3/uL (ref 0.00–0.07)
Basophils Absolute: 0 10*3/uL (ref 0.0–0.1)
Basophils Relative: 1 %
Eosinophils Absolute: 0.2 10*3/uL (ref 0.0–0.5)
Eosinophils Relative: 3 %
HCT: 35.1 % — ABNORMAL LOW (ref 36.0–46.0)
Hemoglobin: 11.3 g/dL — ABNORMAL LOW (ref 12.0–15.0)
Immature Granulocytes: 0 %
Lymphocytes Relative: 11 %
Lymphs Abs: 0.6 10*3/uL — ABNORMAL LOW (ref 0.7–4.0)
MCH: 27.8 pg (ref 26.0–34.0)
MCHC: 32.2 g/dL (ref 30.0–36.0)
MCV: 86.2 fL (ref 80.0–100.0)
Monocytes Absolute: 0.2 10*3/uL (ref 0.1–1.0)
Monocytes Relative: 4 %
Neutro Abs: 4.5 10*3/uL (ref 1.7–7.7)
Neutrophils Relative %: 81 %
Platelets: 100 10*3/uL — ABNORMAL LOW (ref 150–400)
RBC: 4.07 MIL/uL (ref 3.87–5.11)
RDW: 18.1 % — ABNORMAL HIGH (ref 11.5–15.5)
WBC: 5.6 10*3/uL (ref 4.0–10.5)
nRBC: 0 % (ref 0.0–0.2)

## 2021-03-11 LAB — COMPREHENSIVE METABOLIC PANEL
ALT: 16 U/L (ref 0–44)
AST: 26 U/L (ref 15–41)
Albumin: 1.4 g/dL — ABNORMAL LOW (ref 3.5–5.0)
Alkaline Phosphatase: 45 U/L (ref 38–126)
Anion gap: 7 (ref 5–15)
BUN: 37 mg/dL — ABNORMAL HIGH (ref 6–20)
CO2: 28 mmol/L (ref 22–32)
Calcium: 9.4 mg/dL (ref 8.9–10.3)
Chloride: 97 mmol/L — ABNORMAL LOW (ref 98–111)
Creatinine, Ser: 3.7 mg/dL — ABNORMAL HIGH (ref 0.44–1.00)
GFR, Estimated: 14 mL/min — ABNORMAL LOW (ref >60)
Glucose, Bld: 120 mg/dL — ABNORMAL HIGH (ref 70–99)
Potassium: 5.3 mmol/L — ABNORMAL HIGH (ref 3.5–5.1)
Sodium: 132 mmol/L — ABNORMAL LOW (ref 135–145)
Total Bilirubin: 0.7 mg/dL (ref 0.3–1.2)
Total Protein: 5 g/dL — ABNORMAL LOW (ref 6.5–8.1)

## 2021-03-11 LAB — GLUCOSE, CAPILLARY
Glucose-Capillary: 105 mg/dL — ABNORMAL HIGH (ref 70–99)
Glucose-Capillary: 109 mg/dL — ABNORMAL HIGH (ref 70–99)
Glucose-Capillary: 112 mg/dL — ABNORMAL HIGH (ref 70–99)
Glucose-Capillary: 123 mg/dL — ABNORMAL HIGH (ref 70–99)
Glucose-Capillary: 124 mg/dL — ABNORMAL HIGH (ref 70–99)
Glucose-Capillary: 131 mg/dL — ABNORMAL HIGH (ref 70–99)
Glucose-Capillary: 93 mg/dL (ref 70–99)

## 2021-03-11 LAB — PHOSPHORUS: Phosphorus: 1.8 mg/dL — ABNORMAL LOW (ref 2.5–4.6)

## 2021-03-11 LAB — MAGNESIUM: Magnesium: 1.8 mg/dL (ref 1.7–2.4)

## 2021-03-11 MED ORDER — SODIUM ZIRCONIUM CYCLOSILICATE 10 G PO PACK
10.0000 g | PACK | Freq: Once | ORAL | Status: AC
  Administered 2021-03-11: 10 g
  Filled 2021-03-11: qty 1

## 2021-03-11 NOTE — Progress Notes (Signed)
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      Patient is encephalopathic, therefore history has been obtained from chart review. Per EMS, patient began dialysis and 30 seconds after cannulation, site was noted to be infiltrating into the R chest and R arm, and became unresponsive. EMS was called and began Basic Life support ventilations, applied Tourniquet clamp, transported to hospital where she was found to be hypotensive and in shock; has recieved 3 units of PRBCs during hospital stay as of 5/15    PT Comments    Patient received in bed, continues to be mostly non-verbal. Very hard to assess cognition today, as she shook her head "no" even to questions that did not warrant a yes/no answer (for example, "what color is the gown you're wearing?"). Still requires totalAx2 for all aspects of mobility. Able to try standing today- required multiple attempts but able to get to almost full upright however unable to maintain for more than a couple of seconds. Left in bed in chair position with all needs met, bed alarm active. Continue to recommend SNF.    Follow Up Recommendations  SNF     Equipment Recommendations  Rolling walker with 5" wheels;3in1 (PT);Hospital bed;Other (comment);Wheelchair (measurements PT);Wheelchair cushion (measurements PT) (hoyer lift and pads)    Recommendations for Other Services       Precautions / Restrictions Precautions Precautions: Fall;Other (comment) Precaution Comments: cortrak, mitts Restrictions Weight Bearing Restrictions: No    Mobility  Bed Mobility Overal bed mobility: Needs Assistance Bed Mobility: Supine to Sit;Sit to Supine      Supine to sit: Total assist;+2 for physical assistance Sit to supine: Total assist;+2 for physical assistance   General bed mobility comments: totalaX2 for all aspects of bed mobility. Heavy posterior lean requiring totalA for upright.    Transfers Overall transfer level: Needs assistance Equipment used: 2 person hand held assist Transfers: Sit to/from Stand Sit to Stand: Total assist;+2 physical assistance         General transfer comment: totalAx2 persons and multiple attempts to boost to upright standing- only got half way up on first attempt, able to get 90% of the way to a full stand on second attempt but only able to maintain for about 2-3 seconds.  Ambulation/Gait             General Gait Details: unable   Stairs             Wheelchair Mobility    Modified Rankin (Stroke Patients Only)       Balance Overall balance assessment: Needs assistance Sitting-balance support: Feet supported;Bilateral upper extremity supported Sitting balance-Leahy Scale: Poor Sitting balance - Comments: heavy posterior lean Postural control: Posterior lean   Standing balance-Leahy Scale: Zero Standing balance comment: reliant on external support                            Cognition Arousal/Alertness: Awake/alert Behavior During Therapy: Flat affect Overall Cognitive Status: No family/caregiver present to determine baseline cognitive functioning  General Comments: very difficult to get a good read on her cognition today. Either shook her head no to everything or did not interact. Unable to tell me what color her gown was, and shook her head "no" instead. No verbalizations today.      Exercises      General Comments        Pertinent Vitals/Pain Pain Assessment: Faces Faces Pain Scale: Hurts little more Pain Location: grimacing with movement, difficult to tell exactly where she was experiencing pain Pain  Descriptors / Indicators: Grimacing;Discomfort Pain Intervention(s): Limited activity within patient's tolerance;Monitored during session    Home Living                      Prior Function            PT Goals (current goals can now be found in the care plan section) Acute Rehab PT Goals Patient Stated Goal: did not state PT Goal Formulation: With patient Time For Goal Achievement: 03/17/21 Potential to Achieve Goals: Fair Progress towards PT goals: Not progressing toward goals - comment (cognition)    Frequency    Min 2X/week      PT Plan Current plan remains appropriate    Co-evaluation              AM-PAC PT "6 Clicks" Mobility   Outcome Measure  Help needed turning from your back to your side while in a flat bed without using bedrails?: Total Help needed moving from lying on your back to sitting on the side of a flat bed without using bedrails?: Total Help needed moving to and from a bed to a chair (including a wheelchair)?: Total Help needed standing up from a chair using your arms (e.g., wheelchair or bedside chair)?: Total Help needed to walk in hospital room?: Total Help needed climbing 3-5 steps with a railing? : Total 6 Click Score: 6    End of Session   Activity Tolerance: Patient tolerated treatment well Patient left: in bed;with call bell/phone within reach;with bed alarm set;Other (comment) (bed in chair position) Nurse Communication: Mobility status PT Visit Diagnosis: Unsteadiness on feet (R26.81);Other abnormalities of gait and mobility (R26.89);Muscle weakness (generalized) (M62.81)     Time: 0045-9977 PT Time Calculation (min) (ACUTE ONLY): 16 min  Charges:  $Therapeutic Activity: 8-22 mins                     Windell Norfolk, DPT, PN1   Supplemental Physical Therapist [place]    Pager [phone] Acute Rehab Office [phone]

## 2021-03-11 NOTE — Progress Notes (Signed)
PROGRESS NOTE    [name]  [id number] DOB: 12-24-1960 DOA: 02/28/2021 PCP: Pcp, No  Brief Narrative:  The patient is a extremely thin cachectic and chronically ill-appearing 60 year old African-American female with a past medical history significant for PAD, history of CVA, history of subarachnoid hemorrhage, history of seizures, history of proximal atrial fibrillation, history of chronic urinary issues, hyperlipidemia, hypertension, ESRD on hemodialysis, COPD as well as other comorbidities who presented to ED from her hemodialysis unit after right arm AV fistula infiltrated and her blood pressure dropped.  She became extremely altered and hemoglobin in the ER returned to 7.4 which is down from 13.  She was admitted to the ICU and got 1 unit PRBCs and seen by vascular surgery stabilized and transferred to [facility] on 03/02/2021 with a hemoglobin 6.9.  Subsequently she received 2 more units and hemoglobin improved and is now stable.  Her p.o. and intake has been extremely marginal and poor.  She has failure to thrive and palliative care has been consulted for goals of care discussion however we cannot get in touch with the patient's family members at all.  Nursing stated that she spit out all her medications today and has had a poor appetite again today.  Calorie count done and after discussion with the nutritionist she is unlikely to meet her nutritional needs so we will go ahead and place a Cortrak for tube feeding purposes and reevaluate Monday if her nutritional status is any better while she gets TF.  Her mentation continues to wax and wane so Neurology was consulted for further evaluation and Recc's.  Neurology recommended work-up but felt that this is chronic and had nothing else to offer.  Still attempting to reach the patient's daughters and this has been unsuccessful.  Tube feedings are in place now.  Patient's mental status was not too great today.  Assessment & Plan:   Active Problems:    Hemorrhagic shock (HCC)  Acute metabolic encephalopathy and hemodialysis with hypotension/shock and acute drop in hemoglobin -Initially there was a question of her right arm AV fistula played, she also has a history of underlying seizures and subdural hematoma -She is more somnolent and withdrawn today and nephrology does not feel that she is at her baseline.  She continues have extremely poor p.o. intake and her calorie count reveals that she is not meeting her nutritional needs so we will place a core track for tube feedings -may discuss with neurology about her mental status -She had a hemoglobin of 6.9 initially but this is improved and her last hemoglobin/hematocrit was 13.0/40.7 -A DIC panel was checked including LDH, haptoglobin and occult stool and a CT of the abdomen pelvis was done to rule out any retroperitoneal loss -She received 1 unit of PRBCs in ICU and she was given 2 more units of PRBCs with hemodialysis on 03/02/2021 -Echocardiogram has been checked to ensure valves are stable -Continue to monitor her mentation status carefully and will order a calorie count and a dietitian consult -she continues to be hypotensive with hemodialysis but midodrine has been added back on dialysis days which is improved -Will Check EEG today and Last Head CT was 02/28/21 and showed "Stable postoperative changes. Stable position of ventriculoperitoneal shunt with tip posterior to the third ventricle in the left midbrain region. Stable ventricular enlargement. Multiple prior infarcts, largest in the left parietal lobe region. No acute infarct evident. No mass, hemorrhage, or appreciable extra-axial fluid collections. Multiple foci of arterial vascular calcification evident."  -EEG  done and showed the study was suggestive of cortical dysfunction arising from the left frontotemporal region likely secondary to underlying structural abnormalities as well as a mild diffuse encephalopathy which is nonspecific in  etiology with no seizures or epileptiform discharges seen throughout the whole reading. -Repeat head CT and showed "Stable chronic findings as described above. No acute intracranial abnormality seen." -Her mentation waxes and wanes and so neurology was consulted for further evaluation and they are pursuing a metabolic work-up but do suspect this is secondary to her poor p.o. intake and failure to thrive. -We will need to continue to have goals of care discussion and will continue her Cortrak tube feedings at this time -Neurology not pursing MRI given her Shunt Coils and feel that the MRI will not change much in terms of her outcome  -Continue with goals of care discussion if we are able to reach family adequately as patient is intermittently confused; Social worker initiated a Wellness Check on patient's daughters. Patient's sister is available now and number in the chart. If unable to get ahold of Daughters will speak to Sister  History of seizures, bilateral SDH and VP shunt -He has no headache and a supple neck and she is continue Keppra which she takes at home but because of her poor p.o. intake we have changes to IV -We will continue monitor closely and there is a question if she had a fluid imbalance induced hypotension causing seizure activity at her dialysis unit causing her AMS -Checked EEG as above -She is intermittently waxing and waning and was more withdrawn today.  Neurology has been consulted as above and have signed off the case  Acute anemia superimposed on anemia of chronic kidney disease -It is unclear if she had a lab error that showed a low hemoglobin hematocrit.  After 2 units of PRBCs her hemoglobin trended up to 11 and is now stable at 13 as above -Patient is Hgb/Hct is now slowly dropping and gone from 13.0/40.7 -> 12.9/40.1 ->12.3/39.2 -> 11.9/36.9 and today was 11.7/37.3 -> 11.2/34.8 is 11.3/35.1 -CT of the abdomen and pelvis showed no retroperitoneal bleed and the  echocardiogram of the abdominal aorta was without any major valvular abnormalities and there is no obvious source of ongoing bleeding -Hemoglobin/hematocrit is now stable -Continue monitor for signs and symptoms of bleeding; no overt bleeding noted  GERD -Changed PPI to IV given her poor p.o. intake  Right arm AV Fistula Infiltration -Seen by vascular surgery and they feel that the infiltration of the hematoma on the right arm is not impressive and this would not profoundly explain her anemia in the plan to rest the fistula for about 2 weeks and dialyze via a right IJ [facility] -Dr. [name] feels that the aVF may have a pseudoaneurysm but this does not need urgent intervention -Right Upper Extremity Duplex negative for DVT  Severe failure to thrive and deconditioning -PT OT evaluated and recommending SNF next-patient is a very poor p.o. intake so we have consulted nutritionist and have started a calorie count -Palliative care has been called for goals of care discussion question -SLP has been consulted for evaluation and they have placed her on a dysphagia 2 diet with thin liquid; her diet has been changed to dysphagia diet due to her spitting out food and meds -Continue with protein calorie supplements and calorie count; Patient failed Calorie Count so will place Cortrak and start TF -Prevalon boots have been added -Continue with tube feedings as she only 85% of  her nutritional goals via tube feedings  ESRD on Tuesday Thursday Saturday  Hyperkalemia Hypophosphatemia -Nephrology is following and patient got dialyzed through a right IJ dialysis catheter since her right AV fistula has infiltrated and she has been seen by the vascular surgery.  If she has been repaired and they are recommending rest at this time -Patient's BUN/Cr went from 22/4.98 -> 23/5.36 -> 15/3.67 -> 25/4.33 -> 11.2/34.8 -continue with calcitriol 0.5 mcg every Tuesday Thursday Saturday and continue with calcium acetate 667 p.o. 3  times daily with meals  -Patient is sodium is now 132, potassium is 5.3, and phosphorus is 1.8 -Likely to be corrected in dialysis but nephrology is on the plan on giving a single dose of Lokelma today -Continue to Monitor and Trend Renal Fxn carefully -Repeat CMP in the AM -Appreciate nephrology evaluation and recommendations as she is tolerating hemodialysis well and having ultrafiltration as tolerated  Proximal Atrial Fibrillation -Continue with Metoprolol for rate control and Amiodarone -She is not a candidate for anticoagulation given her intracranial hemorrhage/subarachnoid hemorrhage/anemia -Head CT done as above; may pursue MRI but will await neurology recommendations  History of right lower extremity DVT -Age is indeterminate and she is not a candidate for anticoagulation due to her subdural bleeds and previous admissions IR was consulted for IVC filter but this was not recommended at that time. -Continue monitor  Hypothyroidism -She was on a beta-blocker and apparently methimazole discontinued last admission -She has a stable TSH and she needs outpatient endocrine follow-up at discharge  Anasarca with ascites and effusion due to severe protein calorie malnutrition and extremely poor nutritional status -She is asymptomatic and she is on supplements  -Nutritionist consulted for further evaluation and she is now getting calorie count and because she did not pass will get a Cortrak  Goals of care -Her baseline quality of life seems to be extremely poor due to her brain injury and extremely poor nutritional status as well as multiple comorbidities including ESRD.  She is extremely poor functional status and we have consulted palliative care for goals of care consultation and discussion -Unable to reach family again today and Palliative recommending outpatient Palliative now -Will place Cortrak given inadequate Nutritional Intake and will need to Re-evaluate with GOC discussion once  able to get in touch with family; Unable to reach the daughters but able to reach the sister today -Patient needs to go back to SNF but cannot go to SNF with a Cortrak tube in place will need to figure out further goals of care   Hypertension -On nondialysis days her systolics are between [id number] and 140 requiring low-dose beta-blockers however she extremely sensitive to fluid shifts and on the days of dialysis her systolic falls below 80.  She gets midodrine during the dialysis times and nephrology is following closely -Continue monitor blood pressures per protocol neurology is trying to maximize ultrafiltration and titrate down volume as tolerated -Last BP was 150/69  Hyperlipidemia -Continue Rosuvastatin 10 mg p.o. daily if able to tolerate p.o.  DVT prophylaxis: SCDs Code Status: FULL CODE  Family Communication: No family present at bedside  Disposition Plan: Pending further clinical improvement and tolerance of the diet and insurance that she is adequately taking in enough nutrition for safe discharge disposition; She will need SNF at D/C   Status is: Inpatient  Remains inpatient appropriate because:Unsafe d/c plan, IV treatments appropriate due to intensity of illness or inability to take PO and Inpatient level of care appropriate due to severity of  illness   Dispo: The patient is from: Home              Anticipated d/c is to: SNF              Patient currently is not medically stable to d/c.   Difficult to place patient No  Consultants:   Nephrology  Palliative Care Medicine   Neurology    Procedures: Hemodialysis  Antimicrobials:  Anti-infectives (From admission, onward)   Start     Dose/Rate Route Frequency Ordered Stop   03/08/21 1815  doxycycline (VIBRAMYCIN) 100 mg in sodium chloride 0.9 % 250 mL IVPB        100 mg 125 mL/hr over 120 Minutes Intravenous Every 12 hours 03/08/21 1722     03/02/21 1200  vancomycin (VANCOCIN) IVPB 500 mg/100 ml premix  Status:   Discontinued        500 mg 100 mL/hr over 60 Minutes Intravenous Every T-Th-Sa (Hemodialysis) 02/28/21 1747 03/01/21 0824   03/01/21 1800  ceFEPIme (MAXIPIME) 1 g in sodium chloride 0.9 % 100 mL IVPB  Status:  Discontinued        1 g 200 mL/hr over 30 Minutes Intravenous Every 24 hours 02/28/21 1747 03/01/21 0824   02/28/21 1800  vancomycin (VANCOREADY) IVPB 1000 mg/200 mL        1,000 mg 200 mL/hr over 60 Minutes Intravenous  Once 02/28/21 1716 02/28/21 1957   02/28/21 1730  ceFEPIme (MAXIPIME) 2 g in sodium chloride 0.9 % 100 mL IVPB        2 g 200 mL/hr over 30 Minutes Intravenous  Once 02/28/21 1716 02/28/21 1855   02/28/21 1730  metroNIDAZOLE (FLAGYL) IVPB 500 mg        500 mg 100 mL/hr over 60 Minutes Intravenous  Once 02/28/21 1716 02/28/21 1938        Subjective: Seen and examined at bedside and a little more somnolent and withdrawn.  Unable to provide a subjective history due to her current condition.  No family currently at bedside.  Did not appear to be in acute distress.  Objective: Vitals:   03/11/21 0502 03/11/21 0800 03/11/21 1313 03/11/21 1317  BP: 139/66 (!) 152/65 (!) 150/69 (!) 150/69  Pulse:  85 82 81  Resp: 18  (!) 21   Temp: 98.5 F (36.9 C)  99.1 F (37.3 C)   TempSrc: Oral  Oral   SpO2: 95%  98%   Weight: 43.9 kg     Height:       No intake or output data in the 24 hours ending 03/11/21 1656 Filed Weights   03/09/21 1120 03/10/21 0418 03/11/21 0502  Weight: 38.6 kg 40 kg 43.9 kg   Examination: Physical Exam:  Constitutional: She is a thin frail cachectic African-American female who is withdrawn and confused but nods her head on some questioning  Eyes: Lids and conjunctivae normal, sclerae anicteric  ENMT: External Ears, Nose appear normal. Grossly normal hearing.  Neck: Appears normal, supple, no cervical masses, normal ROM, no appreciable thyromegaly; no JVD Respiratory: Diminished to auscultation bilaterally with coarse breath sounds, no  wheezing, rales, rhonchi or crackles. Normal respiratory effort and patient is not tachypenic. No accessory muscle use.  Unlabored breathing Cardiovascular: RRR, no murmurs / rubs / gallops. S1 and S2 auscultated. No extremity edema. Abdomen: Soft, non-tender, non-distended. Bowel sounds positive.  GU: Deferred. Musculoskeletal: No clubbing / cyanosis of digits/nails. No joint deformity upper and lower extremities.  Skin: No rashes, lesions, ulcers  on a limited skin evaluation. No induration; Warm and dry.  Neurologic: CN 2-12 grossly intact with no focal deficits. Romberg sign cerebellar reflexes not assessed.  Psychiatric: Impaired judgment and insight.  She is more somnolent and withdrawn today  Data Reviewed: I have personally reviewed following labs and imaging studies  CBC: Recent Labs  Lab 03/07/21 0252 03/07/21 0826 03/08/21 0253 03/09/21 0102 03/10/21 0103 03/11/21 0253  WBC 5.9 6.3 5.4 5.6 5.3 5.6  NEUTROABS 4.7  --  4.0 4.5 4.3 4.5  HGB 12.9 12.3 11.9* 11.7* 11.2* 11.3*  HCT 40.1 39.2 36.9 37.3 34.8* 35.1*  MCV 86.1 87.7 86.4 88.2 87.2 86.2  PLT PLATELET CLUMPS NOTED ON SMEAR, COUNT APPEARS DECREASED 118* 85* 91* 79* [id number]*   Basic Metabolic Panel: Recent Labs  Lab 03/07/21 0252 03/07/21 0816 03/08/21 0253 03/09/21 0102 03/10/21 0103 03/11/21 0253  NA 136 135 134* 134* 136 132*  K 4.2 4.3 3.9 4.2 4.3 5.3*  CL 99 98 98 100 99 97*  CO2 '27 27 30 29 28 28  '$ GLUCOSE 65* 59* 115* 114* 141* 120*  BUN 22* 23* 15 25* 23* 37*  CREATININE 4.98* 5.36* 3.67* 4.33* 2.97* 3.70*  CALCIUM 10.0 9.6 9.6 9.5 9.2 9.4  MG 2.1  --  2.1 2.2 1.8 1.8  PHOS 4.2 4.4 3.9 4.0 1.6* 1.8*   GFR: Estimated Creatinine Clearance: 11.5 mL/min (A) (by C-G formula based on SCr of 3.7 mg/dL (H)). Liver Function Tests: Recent Labs  Lab 03/07/21 0252 03/07/21 0816 03/08/21 0253 03/09/21 0102 03/10/21 0103 03/11/21 0253  AST 12*  --  12* '16 16 26  '$ ALT 8  --  '8 9 10 16  '$ ALKPHOS 51  --  50 49  50 45  BILITOT 0.7  --  0.4 0.4 0.3 0.7  PROT 5.0*  --  5.1* 4.8* 5.0* 5.0*  ALBUMIN 1.5* 1.5* 1.5* 1.4* 1.4* 1.4*   No results for input(s): LIPASE, AMYLASE in the last 168 hours. Recent Labs  Lab 03/08/21 1801  AMMONIA 16   Coagulation Profile: No results for input(s): INR, PROTIME in the last 168 hours. Cardiac Enzymes: No results for input(s): CKTOTAL, CKMB, CKMBINDEX, TROPONINI in the last 168 hours. BNP (last 3 results) No results for input(s): PROBNP in the last 8760 hours. HbA1C: No results for input(s): HGBA1C in the last 72 hours. CBG: Recent Labs  Lab 03/11/21 0016 03/11/21 0503 03/11/21 0800 03/11/21 1242 03/11/21 1638  GLUCAP 93 123* 131* 124* 109*   Lipid Profile: No results for input(s): CHOL, HDL, LDLCALC, TRIG, CHOLHDL, LDLDIRECT in the last 72 hours. Thyroid Function Tests: Recent Labs    03/08/21 1801  TSH 1.635   Anemia Panel: Recent Labs    03/08/21 1801  VITAMINB12 774  FOLATE 6.7   Sepsis Labs: No results for input(s): PROCALCITON, LATICACIDVEN in the last 168 hours.  No results found for this or any previous visit (from the past 240 hour(s)).   RN Pressure Injury Documentation: Pressure Injury 11/29/20 Sacrum Posterior Stage 2 -  Partial thickness loss of dermis presenting as a shallow open injury with a red, pink wound bed without slough. (Active)  11/29/20 0400  Location: Sacrum  Location Orientation: Posterior  Staging: Stage 2 -  Partial thickness loss of dermis presenting as a shallow open injury with a red, pink wound bed without slough.  Wound Description (Comments):   Present on Admission: Yes    Estimated body mass index is 18.3 kg/m as calculated from the following:  Height as of this encounter: 5' 0.98" (1.549 m).   Weight as of this encounter: 43.9 kg.  Malnutrition Type:  Nutrition Problem: Severe Malnutrition Etiology: chronic illness (ESRD on HD)   Malnutrition Characteristics:  Signs/Symptoms: moderate fat  depletion,severe fat depletion,moderate muscle depletion,severe muscle depletion  Nutrition Interventions:  Interventions: Tube feeding   Radiology Studies: No results found. Scheduled Meds: . sodium chloride   Intravenous Once  . sodium chloride   Intravenous Once  . amiodarone  200 mg Oral Daily  . calcitRIOL  0.5 mcg Oral Q T,Th,Sa-HD  . chlorhexidine  15 mL Mouth Rinse BID  . Chlorhexidine Gluconate Cloth  6 each Topical Q0600  . feeding supplement (PROSource TF)  45 mL Per Tube Daily  . free water  50 mL Per Tube Q6H  . levETIRAcetam  500 mg Per Tube BID  . mouth rinse  15 mL Mouth Rinse q12n4p  . metoprolol tartrate  2.5 mg Intravenous Q8H  . midodrine  10 mg Oral Q T,Th,Sa-HD  . midodrine  5 mg Oral BID WC  . multivitamin  1 tablet Per Tube QHS  . pantoprazole sodium  40 mg Per Tube Daily  . rosuvastatin  10 mg Oral Daily   Continuous Infusions: . sodium chloride    . doxycycline (VIBRAMYCIN) IV 100 mg (03/11/21 0812)  . feeding supplement (OSMOLITE 1.5 CAL) 1,000 mL (03/11/21 0202)    LOS: 11 days   Kerney Elbe, DO [facility] PAGER is on [place]  If 7PM-7AM, please contact night-coverage [email or web address]

## 2021-03-11 NOTE — Progress Notes (Signed)
  Speech Language Pathology Treatment: Dysphagia  Patient Details Name: [name] MRN: [id number] DOB: 11/24/1960 Today's Date: 03/11/2021 Time: 4081-4481 SLP Time Calculation (min) (ACUTE ONLY): 13.85 min  Assessment / Plan / Recommendation Clinical Impression  Pt was seen for dysphagia treatment. She was alert and cooperative during the session without c/o pain. Pt indicated that she has been able to consume solids and liquids without difficulty swallowing, but does not like some foods and therefore spits them out. Pt tolerated regular texture solids and thin liqiuds via straw without overt s/sx of aspiration. Mastication and oral clearance were adequate for regular textures. Pt accepted a puree (i.e., applesauce) bolus, but subsequently opened her mouth to spit it out at which point SLP used oral suction to remove it. Pt indicated that she did not like it, and subsequently consumed additional regular textures without symptoms of oropharyngeal dysphagia. SLP suspects that the pt's behaviors of spitting out foods will persist with foods which she does not like, but is hopeful that this may improve with improvement in mentation. She indicated that she has liked the consistency of the current diet and staff has reported the most success with these consistencies. Pt's current diet will be continued, but she may still have all consistencies from an oropharyngeal swallowing standpoint and her diet may be advanced. Further acute skilled SLP services are not clinically indicated at this time.   HPI HPI: Pt is a 60 year old female who presented to the ED secondary to AMS, hypotension, bleed from AV fistula. Pt began dialysis and 30 seconds after cannulation, site was noted to be infiltrating into the R chest and R arm. Pt had questionable seizure activity (per HD center staff) and became unresponsive. Dx Acute metabolic encephalopathy. PMHx significant for HTN, diastolic HF, CAD, PAF, CVA/SAH s/p VP shunt and  ESRD. BSE 11/11/20 was WNL; regular texture diet and thin liquids recommended without need for f/u.      SLP Plan  Discharge SLP treatment due to (comment);All goals met       Recommendations  Diet recommendations: Dysphagia 2 (fine chop);Thin liquid (with allowance of up to regular textures) Liquids provided via: Cup;Straw Medication Administration: Crushed with puree Supervision: Staff to assist with self feeding Compensations: Minimize environmental distractions Postural Changes and/or Swallow Maneuvers: Seated upright 90 degrees                Oral Care Recommendations: Oral care BID Follow up Recommendations: None SLP Visit Diagnosis: Dysphagia, unspecified (R13.10) Plan: Discharge SLP treatment due to (comment);All goals met       Ygnacio Fecteau I. Hardin Negus, [place], [place] Office number [phone] Pager [phone]                Horton Marshall 03/11/2021, 5:13 PM

## 2021-03-11 NOTE — Progress Notes (Signed)
[facility] Progress Note   Dialysis Orders: TTS [facility] 4h 400/500 37.5kg 2/2 bath RUA AVF/ RIJ TDC - sensipar 90 po tiw, on hold from 4/14 - mircera 150 q2 last 4.28 - calcitriol 1.25 ug tiw   Assessment/ Plan:    1. Shock- acute,hypotension event very shortly after initiating HD, BP's were in the70'sat HD unit. Sent to ED.Noted drop in hgb in ED, pRBC given.Continues to have hypotension with HD, midodrine added on dialysis days. 2. AMS - 2/2 seizure activity vs hypotension. Neurology consulted.  3. R arm pain/infiltration- Seen by VVS,infiltration noted but not enough bleeding to be cause of drop in Hgb. Plan to rest AVF x 2wks. Hb improved now at11.9s/p total 3 units since admission. 4. ESRD- on HD TTS. - Single dose of Lokelma today. - Next HD 5/24.  5. BP/ volume -BPelevated this AM.MidodrineBID with extra does pre HD-wasn't given 5/21 and had hypotension on HD. On admit Vol overloaded on exam and by CT w/ significant ascites and areas of localized pitting edema.  Localized pitting edema improving, continue to titrate down volume as tolerated.   6. Seizures- takes keppra 7. Atrial Fib- on amiodarone, IV metoprolol. Not on AC due to prior ICH, hx of anemia 8. Anemia ckd- Hgb 11.2 Aranesp dose given on 5/14/22Now s/p 3units of blood since admit and improved to goal.  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 Binders and calcitriol-Sensipar on hold from outpatient; hypophosphatemic. Will recheck on Wed of Thur. 10. Nutrition -changed to dysphagia diet due to spitting out food.  Continues to have poor intake -Cortrak placed 5/20. TF +protein supplements per RD    Subjective:   Seen in room. Awake, no new complaints. Nods head but confused.    Objective:   BP 139/66 (BP Location: Right Arm)   Pulse 81   Temp 98.5 F (36.9 C) (Oral)   Resp 18   Ht 5' 0.98" (1.549 m)   Wt 43.9 kg   SpO2 95%   BMI 18.30 kg/m  No intake or output  data in the 24 hours ending 03/11/21 0739 Weight change: 3.8 kg  Physical Exam: General: chronically ill appearing, NGT in place   Heart: RRR Lungs: CTAB, nml WOB Abdomen: soft, NTND Extremities: no LE edema Dialysis Access: TDC, RU BCF +thrill, RUE swelling but not pulsatile  Imaging: No results found.  Labs: BMET Recent Labs  Lab 03/06/21 0117 03/07/21 0252 03/07/21 0816 03/08/21 0253 03/09/21 0102 03/10/21 0103 03/11/21 0253  NA 136 136 135 134* 134* 136 132*  K 3.7 4.2 4.3 3.9 4.2 4.3 5.3*  CL 100 99 98 98 100 99 97*  CO2 '28 27 27 30 29 28 28  '$ GLUCOSE 83 65* 59* 115* 114* 141* 120*  BUN 15 22* 23* 15 25* 23* 37*  CREATININE 3.98* 4.98* 5.36* 3.67* 4.33* 2.97* 3.70*  CALCIUM 9.6 10.0 9.6 9.6 9.5 9.2 9.4  PHOS  --  4.2 4.4 3.9 4.0 1.6* 1.8*   CBC Recent Labs  Lab 03/08/21 0253 03/09/21 0102 03/10/21 0103 03/11/21 0253  WBC 5.4 5.6 5.3 5.6  NEUTROABS 4.0 4.5 4.3 4.5  HGB 11.9* 11.7* 11.2* 11.3*  HCT 36.9 37.3 34.8* 35.1*  MCV 86.4 88.2 87.2 86.2  PLT 85* 91* 79* 100*    Medications:    . sodium chloride   Intravenous Once  . sodium chloride   Intravenous Once  . amiodarone  200 mg Oral Daily  . calcitRIOL  0.5 mcg Oral Q  T,Th,Sa-HD  . chlorhexidine  15 mL Mouth Rinse BID  . Chlorhexidine Gluconate Cloth  6 each Topical Q0600  . feeding supplement (PROSource TF)  45 mL Per Tube Daily  . free water  50 mL Per Tube Q6H  . levETIRAcetam  500 mg Per Tube BID  . mouth rinse  15 mL Mouth Rinse q12n4p  . metoprolol tartrate  2.5 mg Intravenous Q8H  . midodrine  10 mg Oral Q T,Th,Sa-HD  . midodrine  5 mg Oral BID WC  . multivitamin  1 tablet Per Tube QHS  . pantoprazole sodium  40 mg Per Tube Daily  . rosuvastatin  10 mg Oral Daily      Otelia Santee, MD 03/11/2021, 7:39 AM

## 2021-03-11 NOTE — TOC Progression Note (Addendum)
Transition of Care [facility]) - Progression Note    Patient Details  Name: Hajar Fabrizio MRN: [id number] Date of Birth: 11-11-1960  Transition of Care [facility]) CM/SW [place], LCSW Phone Number: 03/11/2021, 2:31 PM  Clinical Narrative:    2:31pm-CSW left voicemail for patient's sister, [name], as patient's daughters have not been responding. Also left another voicemail for daughter [name]. CSW spoke with Tressa Busman at [place] [place] and he reported that he has not been able to reach family since her admission there but will send CSW their contact info for comparison.   CSW contacted [employer] to conduct a wellness check at patient's daughter's last known address (on Facesheet).  2:51pm-CSW received return call from patient's sister, [name] [phone]). She stated that she can make decisions for patient if she is allowed. CSW passed her contact info (and her daughter Christina's number [phone]) to MD. No response yet from [employer].    Expected Discharge Plan: [place] Barriers to Discharge: Continued Medical Work up  Expected Discharge Plan and Services Expected Discharge Plan: [place] In-house Referral: Clinical Social Work   Post Acute Care Choice: [place] Living arrangements for the past 2 months: [place]                                       Social Determinants of Health (SDOH) Interventions    Readmission Risk Interventions Readmission Risk Prevention Plan 03/05/2021  Transportation Screening Complete  Medication Review Press photographer) Complete  PCP or Specialist appointment within 3-5 days of discharge Complete  HRI or Home Care Consult Complete  SW Recovery Care/Counseling Consult Complete  Palliative Care Screening Complete  Skilled Nursing Facility Complete  Some recent data might be hidden

## 2021-03-11 NOTE — TOC Progression Note (Signed)
Transition of Care [facility]) - Progression Note    Patient Details  Name: [name] MRN: [id number] Date of Birth: [birth year]  Transition of Care [facility]) CM/SW [place], LCSW Phone Number: 03/11/2021, 8:47 AM  Clinical Narrative:    CSW continuing to follow for discharge needs once medically stable.   Expected Discharge Plan: [place] Barriers to Discharge: Continued Medical Work up  Expected Discharge Plan and Services Expected Discharge Plan: [place] In-house Referral: Clinical Social Work   Post Acute Care Choice: [place] Living arrangements for the past 2 months: [place]                                       Social Determinants of Health (SDOH) Interventions    Readmission Risk Interventions Readmission Risk Prevention Plan 03/05/2021  Transportation Screening Complete  Medication Review Press photographer) Complete  PCP or Specialist appointment within 3-5 days of discharge Complete  HRI or Home Care Consult Complete  SW Recovery Care/Counseling Consult Complete  Palliative Care Screening Complete  Skilled Nursing Facility Complete  Some recent data might be hidden

## 2021-03-11 NOTE — Progress Notes (Addendum)
Nutrition Follow-up  DOCUMENTATION CODES:   Underweight,Severe malnutrition in context of chronic illness  INTERVENTION:   -Continue renal MVI (via tube) -Continue feeding assistance with meals -ContinueMagic cup TID with meals, each supplement provides 290 kcal and 9 grams of protein  -ContinueOsmolite 1.5@ 71m/hr viacortrak tube  462mProsource TF daily  50 ml free water flush every 6 hours  Tube feeding regimen provides1660kcal (100% of needs),79grams of protein, and 82365mf H2O. Total free water: 1023 ml daily  NUTRITION DIAGNOSIS:   Severe Malnutrition related to chronic illness (ESRD on HD) as evidenced by moderate fat depletion,severe fat depletion,moderate muscle depletion,severe muscle depletion.  Ongoing  GOAL:   Patient will meet greater than or equal to 90% of their needs  Met with TF  MONITOR:   PO intake,Supplement acceptance,Labs,Weight trends,TF tolerance,Skin,I & O's  REASON FOR ASSESSMENT:   Consult Calorie Count,Assessment of nutrition requirement/status  ASSESSMENT:   [age] female admitted with hypovolemic shock, acute blood loss from AV fistula, AMS, questionable seizure activity at HD center. PMH includes ESRD on HD, HTN, diastolic HF, CAD, PAF, CVA/SAH s/p VP shunt.  5/16- s/p BSE- recommending regular consistency diet with thin liquids 5/18- s/p BSE- downgraded to dysphagia 2 diet with thin liquids 5/20- cortrak placed (gastric), TF initiated   Reviewed I/O's: -1.5 L since admission  Pt not very communicative at time of visit. She looked at this RD when spoken to, however, did not answer questions. Per RN, pt's mental status has slightly improved and she consumed a few bites of breakfast this AM. She continues to tolerate TF well.   5/20-5/21/22 Breakfast: 290 kcals, 9 grams protein Lunch: 360 kals, 9 grams protein Dinner: 343 kcals, 9 grams protein  Total intake: 993 kcal (66% of minimum estimated needs)  27  grams protein (36% of minimum estimated needs)  Medications reviewed and include keppra.   Labs reviewed: Na: 132, K: 5.3, Phos: 1.8, Mg WDL. CBGS: 93-131 (inpatient orders for glycemic control are none).   Diet Order:   Diet Order            DIET DYS 2 Room service appropriate? Yes with Assist; Fluid consistency: Thin  Diet effective now                 EDUCATION NEEDS:   Not appropriate for education at this time  Skin:  Skin Assessment: Skin Integrity Issues: Skin Integrity Issues:: Stage II Stage II: sacrum  Last BM:  03/07/21  Height:   Ht Readings from Last 1 Encounters:  03/04/21 5' 0.98" (1.549 m)    Weight:   Wt Readings from Last 1 Encounters:  03/11/21 43.9 kg    Ideal Body Weight:  47.7 kg  BMI:  Body mass index is 18.3 kg/m.  Estimated Nutritional Needs:   Kcal:  1500-1700  Protein:  75-95 grams  Fluid:  1000 ml + UOP    JenLoistine ChanceD, LDN, CDCES Registered Dietitian II Certified Diabetes Care and Education Specialist Please refer to [facility] and/or RD on-call/weekend/after hours pager

## 2021-03-12 DIAGNOSIS — R627 Adult failure to thrive: Secondary | ICD-10-CM | POA: Diagnosis not present

## 2021-03-12 DIAGNOSIS — Z66 Do not resuscitate: Secondary | ICD-10-CM

## 2021-03-12 DIAGNOSIS — R578 Other shock: Secondary | ICD-10-CM | POA: Diagnosis not present

## 2021-03-12 DIAGNOSIS — N186 End stage renal disease: Secondary | ICD-10-CM | POA: Diagnosis not present

## 2021-03-12 DIAGNOSIS — Z7189 Other specified counseling: Secondary | ICD-10-CM | POA: Diagnosis not present

## 2021-03-12 DIAGNOSIS — E43 Unspecified severe protein-calorie malnutrition: Secondary | ICD-10-CM | POA: Diagnosis not present

## 2021-03-12 LAB — PHOSPHORUS: Phosphorus: 1.3 mg/dL — ABNORMAL LOW (ref 2.5–4.6)

## 2021-03-12 LAB — COMPREHENSIVE METABOLIC PANEL
ALT: 19 U/L (ref 0–44)
AST: 28 U/L (ref 15–41)
Albumin: 1.4 g/dL — ABNORMAL LOW (ref 3.5–5.0)
Alkaline Phosphatase: 47 U/L (ref 38–126)
Anion gap: 9 (ref 5–15)
BUN: 54 mg/dL — ABNORMAL HIGH (ref 6–20)
CO2: 27 mmol/L (ref 22–32)
Calcium: 9.3 mg/dL (ref 8.9–10.3)
Chloride: 96 mmol/L — ABNORMAL LOW (ref 98–111)
Creatinine, Ser: 4.13 mg/dL — ABNORMAL HIGH (ref 0.44–1.00)
GFR, Estimated: 12 mL/min — ABNORMAL LOW (ref >60)
Glucose, Bld: 119 mg/dL — ABNORMAL HIGH (ref 70–99)
Potassium: 5.6 mmol/L — ABNORMAL HIGH (ref 3.5–5.1)
Sodium: 132 mmol/L — ABNORMAL LOW (ref 135–145)
Total Bilirubin: 1.1 mg/dL (ref 0.3–1.2)
Total Protein: 5.5 g/dL — ABNORMAL LOW (ref 6.5–8.1)

## 2021-03-12 LAB — CBC WITH DIFFERENTIAL/PLATELET
Abs Immature Granulocytes: 0.02 10*3/uL (ref 0.00–0.07)
Basophils Absolute: 0 10*3/uL (ref 0.0–0.1)
Basophils Relative: 1 %
Eosinophils Absolute: 0 10*3/uL (ref 0.0–0.5)
Eosinophils Relative: 0 %
HCT: 33.6 % — ABNORMAL LOW (ref 36.0–46.0)
Hemoglobin: 10.9 g/dL — ABNORMAL LOW (ref 12.0–15.0)
Immature Granulocytes: 0 %
Lymphocytes Relative: 10 %
Lymphs Abs: 0.6 10*3/uL — ABNORMAL LOW (ref 0.7–4.0)
MCH: 27.7 pg (ref 26.0–34.0)
MCHC: 32.4 g/dL (ref 30.0–36.0)
MCV: 85.3 fL (ref 80.0–100.0)
Monocytes Absolute: 0.3 10*3/uL (ref 0.1–1.0)
Monocytes Relative: 5 %
Neutro Abs: 5 10*3/uL (ref 1.7–7.7)
Neutrophils Relative %: 84 %
Platelets: 156 10*3/uL (ref 150–400)
RBC: 3.94 MIL/uL (ref 3.87–5.11)
RDW: 18.1 % — ABNORMAL HIGH (ref 11.5–15.5)
WBC: 6 10*3/uL (ref 4.0–10.5)
nRBC: 0 % (ref 0.0–0.2)

## 2021-03-12 LAB — GLUCOSE, CAPILLARY
Glucose-Capillary: 88 mg/dL (ref 70–99)
Glucose-Capillary: 108 mg/dL — ABNORMAL HIGH (ref 70–99)

## 2021-03-12 LAB — MAGNESIUM: Magnesium: 1.8 mg/dL (ref 1.7–2.4)

## 2021-03-12 MED ORDER — ACETAMINOPHEN 325 MG PO TABS: 650.0000 mg | ORAL_TABLET | Freq: Four times a day (QID) | ORAL | Status: DC | PRN

## 2021-03-12 MED ORDER — HALOPERIDOL LACTATE 2 MG/ML PO CONC
0.5000 mg | ORAL | Status: DC | PRN
  Filled 2021-03-12: qty 0.3

## 2021-03-12 MED ORDER — LORAZEPAM 1 MG PO TABS
1.0000 mg | ORAL_TABLET | ORAL | Status: DC | PRN
Start: 2021-03-12 — End: 2021-03-14

## 2021-03-12 MED ORDER — HEPARIN SODIUM (PORCINE) 1000 UNIT/ML IJ SOLN
INTRAMUSCULAR | Status: AC
  Administered 2021-03-12: 1000 [IU]
  Filled 2021-03-12: qty 4

## 2021-03-12 MED ORDER — LORAZEPAM 2 MG/ML IJ SOLN: 1.0000 mg | INTRAMUSCULAR | Status: DC | PRN

## 2021-03-12 MED ORDER — POLYVINYL ALCOHOL 1.4 % OP SOLN: 1.0000 [drp] | Freq: Four times a day (QID) | OPHTHALMIC | 0 refills | Status: AC | PRN

## 2021-03-12 MED ORDER — BIOTENE DRY MOUTH MT LIQD: 15.0000 mL | OROMUCOSAL | Status: DC | PRN

## 2021-03-12 MED ORDER — ACETAMINOPHEN 325 MG PO TABS: 650.0000 mg | ORAL_TABLET | Freq: Four times a day (QID) | ORAL | Status: AC | PRN

## 2021-03-12 MED ORDER — POLYVINYL ALCOHOL 1.4 % OP SOLN
1.0000 [drp] | Freq: Four times a day (QID) | OPHTHALMIC | Status: DC | PRN
  Filled 2021-03-12: qty 15

## 2021-03-12 MED ORDER — HALOPERIDOL 0.5 MG PO TABS
0.5000 mg | ORAL_TABLET | ORAL | Status: DC | PRN
  Filled 2021-03-12: qty 1

## 2021-03-12 MED ORDER — GLYCOPYRROLATE 1 MG PO TABS: 1.0000 mg | ORAL_TABLET | ORAL | Status: AC | PRN

## 2021-03-12 MED ORDER — HALOPERIDOL LACTATE 5 MG/ML IJ SOLN
0.5000 mg | INTRAMUSCULAR | Status: DC | PRN
Start: 2021-03-12 — End: 2021-03-14

## 2021-03-12 MED ORDER — GLYCOPYRROLATE 0.2 MG/ML IJ SOLN
0.2000 mg | INTRAMUSCULAR | Status: DC | PRN
Start: 2021-03-12 — End: 2021-03-14

## 2021-03-12 MED ORDER — BIOTENE DRY MOUTH MT LIQD: 15.0000 mL | OROMUCOSAL | Status: AC | PRN

## 2021-03-12 MED ORDER — LORAZEPAM 2 MG/ML IJ SOLN
1.0000 mg | INTRAMUSCULAR | Status: DC
  Administered 2021-03-12 – 2021-03-13 (×5): 1 mg via INTRAVENOUS
  Filled 2021-03-12 (×7): qty 1

## 2021-03-12 MED ORDER — HYDROMORPHONE HCL 1 MG/ML IJ SOLN
0.5000 mg | INTRAMUSCULAR | Status: DC | PRN
Start: 2021-03-12 — End: 2021-03-14
  Administered 2021-03-13 (×2): 0.5 mg via INTRAVENOUS
  Filled 2021-03-12 (×2): qty 0.5

## 2021-03-12 MED ORDER — ACETAMINOPHEN 650 MG RE SUPP: 650.0000 mg | Freq: Four times a day (QID) | RECTAL | Status: DC | PRN

## 2021-03-12 MED ORDER — HALOPERIDOL 0.5 MG PO TABS: 0.5000 mg | ORAL_TABLET | ORAL | Status: AC | PRN

## 2021-03-12 MED ORDER — GLYCOPYRROLATE 1 MG PO TABS
1.0000 mg | ORAL_TABLET | ORAL | Status: DC | PRN
  Filled 2021-03-12: qty 1

## 2021-03-12 MED ORDER — LORAZEPAM 1 MG PO TABS: 1.0000 mg | ORAL_TABLET | ORAL | 0 refills | Status: AC | PRN

## 2021-03-12 MED ORDER — GLYCOPYRROLATE 0.2 MG/ML IJ SOLN: 0.2000 mg | INTRAMUSCULAR | Status: DC | PRN

## 2021-03-12 MED ORDER — LORAZEPAM 2 MG/ML PO CONC: 1.0000 mg | ORAL | Status: DC | PRN

## 2021-03-12 NOTE — Progress Notes (Signed)
Pt back from HD. Family present in rm. Pt turned and comfortable at this time. No c/o pain. Cortrak removed per MD order. Will cont to monitor.

## 2021-03-12 NOTE — TOC Progression Note (Addendum)
Transition of Care [facility]) - Progression Note    Patient Details  Name: [name] MRN: [id number] Date of Birth: 07-Jun-1961  Transition of Care [facility]) CM/SW [place], LCSW Phone Number: 03/12/2021, 12:18 PM  Clinical Narrative:    12:18pm-CSW received consult from Palliative requesting referral to [facility] per request of patient's daughter, [name] [phone]). CSW sent referral to Venia Carbon with [facility] for review.  3pm-CSW received notification from [facility], that they have a bed available for patient if they can get in touch with family to complete consents by 5pm. CSW left voicemail for [place] [phone]). CSW contacted patient's sister, [name], via her daughter's number and she stated she would get [name] to call CSW.   3:37pm-CSW received return call from [facility] ([phone]) and provided her with [facility] contact number to arrange paperwork.  5pm-Per Hospice, they are having trouble getting paperwork to patient's daughter as she does not have transportation today. They will still have a bed for patient tomorrow if needed.         Expected Discharge Plan: [place] Barriers to Discharge: Continued Medical Work up  Expected Discharge Plan and Services Expected Discharge Plan: [place] In-house Referral: Clinical Social Work   Post Acute Care Choice: [place] Living arrangements for the past 2 months: [place]                                       Social Determinants of Health (SDOH) Interventions    Readmission Risk Interventions Readmission Risk Prevention Plan 03/05/2021  Transportation Screening Complete  Medication Review Press photographer) Complete  PCP or Specialist appointment within 3-5 days of discharge Complete  HRI or Home Care Consult Complete  SW Recovery Care/Counseling Consult Complete  Palliative Care  Screening Complete  Skilled Nursing Facility Complete  Some recent data might be hidden

## 2021-03-12 NOTE — Progress Notes (Addendum)
OT Cancellation Note  Patient Details Name: [name] MRN: [id number] DOB: [birth year]   Cancelled Treatment:    Reason Eval/Treat Not Completed: Patient at procedure or test/ unavailable;Other (comment) pt at HD, will check back as time allows.   1319: noted pt now full comfort care, acknowledged discontinuation of OT orders.   [name], COTA/L Acute Rehabilitation Services [phone]   [name] 03/12/2021, 7:52 AM

## 2021-03-12 NOTE — Progress Notes (Addendum)
Daily Progress Note   Patient Name: [name]       Date: 03/12/2021 DOB: 04-Jul-1961  Age: 60 y.o. MRN#: [id number] Attending Physician: Kerney Elbe, DO Primary Care Physician: Pcp, No Admit Date: 02/28/2021  Reason for Consultation/Follow-up: Establishing goals of care  Subjective: Patient receiving HD. Daughter [name] and sister [name] at bedside.   Length of Stay: 12  Current Medications: Scheduled Meds:  . chlorhexidine  15 mL Mouth Rinse BID  . LORazepam  1 mg Intravenous Q4H  . mouth rinse  15 mL Mouth Rinse q12n4p    Continuous Infusions: . sodium chloride      PRN Meds: acetaminophen **OR** acetaminophen, antiseptic oral rinse, glycopyrrolate **OR** glycopyrrolate **OR** glycopyrrolate, haloperidol **OR** haloperidol **OR** haloperidol lactate, HYDROmorphone (DILAUDID) injection, LORazepam **OR** [DISCONTINUED] LORazepam **OR** LORazepam, ondansetron (ZOFRAN) IV, phenol, polyvinyl alcohol  No PE, patient in HD.  Vital Signs: BP (!) 147/52 (BP Location: Left Arm)   Pulse 92   Temp 98 F (36.7 C) (Oral)   Resp 18   Ht 5' 0.98" (1.549 m)   Wt 43.5 kg   SpO2 96%   BMI 18.13 kg/m  SpO2: SpO2: 96 % O2 Device: O2 Device: Room Air O2 Flow Rate:    Intake/output summary:   Intake/Output Summary (Last 24 hours) at 03/12/2021 1245 Last data filed at 03/12/2021 1148 Gross per 24 hour  Intake --  Output 2000 ml  Net -2000 ml   LBM: Last BM Date: 03/07/21 Baseline Weight: Weight: 42.1 kg Most recent weight: Weight: 43.5 kg       Palliative Assessment/Data: PPS 20% - very poor PO intake    Flowsheet Rows   Flowsheet Row Most Recent Value  Intake Tab   Referral Department Hospitalist  Unit at Time of Referral Cardiac/Telemetry Unit  Palliative Care Primary  Diagnosis Nephrology  Date Notified 03/05/21  Palliative Care Type New Palliative care  Reason for referral Clarify Goals of Care  Date of Admission 02/28/21  Date first seen by Palliative Care 03/06/21  # of days Palliative referral response time 1 Day(s)  # of days IP prior to Palliative referral 5  Clinical Assessment   Psychosocial & Spiritual Assessment   Palliative Care Outcomes       Patient Active Problem List   Diagnosis Date Noted  . Hemorrhagic shock ([place]) 02/28/2021  . Pressure injury of skin 11/29/2020  . Pleural effusion 11/28/2020  . Subdural fluid collection 11/27/2020  . Left wrist fracture 11/27/2020  . Leg DVT (deep venous thromboembolism), acute, bilateral ([place]) 11/27/2020  . Hyperthyroidism 11/27/2020  . Malnutrition of moderate degree 11/20/2020  . Epigastric abdominal pain 11/08/2020  . NSTEMI (non-ST elevated myocardial infarction) ([place]) 10/16/2020  . AMS (altered mental status) 10/06/2020  . Elevated troponin 10/06/2020  . Headache, unspecified 11/28/2019  . Hypercalcemia 11/10/2019  . Allergy, unspecified, initial encounter 08/08/2019  . Hypokalemia 07/07/2019  . Sepsis, unspecified organism ([place]) 02/03/2019  . End-stage renal disease on hemodialysis ([place]) 02/03/2019  . Unspecified atrial fibrillation ([place]) 02/03/2019  . Hypotension 02/02/2019  . Syncope and collapse   . Goals of care, counseling/discussion   . Atrial fibrillation with rapid ventricular response ([place]) 03/20/2018  . Chills (without  fever) 03/16/2018  . Iron deficiency anemia, unspecified 02/04/2018  . 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 Anemia in chronic kidney disease 12/30/2017  . Fever, unspecified 12/30/2017  . Generalized abdominal pain 12/30/2017  . Heart failure, unspecified ([place]) 12/30/2017  . Other specified personal risk factors, not elsewhere classified 12/30/2017  . Pure hypercholesterolemia, unspecified 12/30/2017  . Secondary hyperparathyroidism of renal  origin ([place]) 12/30/2017  . Shortness of breath 12/30/2017  . History of embolic stroke 123XX123  . Altered mental status 11/08/2013  . HTN (hypertension) 11/08/2013  . Hypertensive renal disease 11/06/2013  . Protein-calorie malnutrition, severe ([place]) 11/04/2013  . Seizure disorder ([place]) 10/22/2013  . Hypertensive urgency 10/20/2013  . Hypertensive heart disease 05/07/2013  . Acute-on-chronic renal failure ([place]) 05/07/2013  . HTN (hypertension), malignant 05/06/2013  . CAD (coronary artery disease) 05/06/2013  . ESRD (end stage renal disease) ([place]) 05/06/2013  . Chronic diastolic heart failure ([place])   . Tobacco abuse 09/25/2012  . H/O noncompliance with medical treatment, presenting hazards to health   . Renal artery stenosis ([place]) 04/05/2012  . History of subarachnoid hemorrhage 03/03/2012  . Mixed hyperlipidemia 09/19/2008  . Acute on chronic diastolic heart failure ([place]) 09/19/2008    Palliative Care Assessment & Plan   HPI: 60 y.o. female  with past medical history of HTN, diastolic HF, CAD (NSTEMI 123XX123 s/p DES to RCA), PAF (not on [facility]), CVA/SAH s/p VP shunt (2013) and ESRD on HD admitted on 02/28/2021 with AMS. On day of admission patient began dialysis and 30 seconds after cannulation, site was noted to be infiltrating into the R chest and R arm. Patient had questionable seizure activity (per HD center staff) and became unresponsive. Found to be anemic. Patient has received several units of blood. Mental status has improved to baseline. Poor PO intake, very frail. PMT consulted for [place].   Assessment: Daughter [name] and sister [name] at bedside. They tell me other daughter [name] does not want to be included in conversation (she has been called multiple times). They ask that we not call her anymore as it is causing too much emotional distress.   Upon entering room Dr [name] at bedside and introduces me as palliative care NP. He and family have already discussed situation -  they are aware of her very poor PO intake. They have decided they do not want to move forward with PEG. They continue to emphasize they only want her kept comfortable and not suffering.  We discuss option of comfort care. Family shares they do not want patient to return to [facility]. We discuss option of hospice home and they are agreeable. Discuss philosophy of hospice and type of care she will receive. Family is agreeable.   We discuss full comfort measures including cessation of HD and dc cortrak. We discuss changing code status to DNR.  Family asks that we call [name] at [phone]  Recommendations/Plan: Comfort measures only No further HD Remove cortrak Scheduled ativan d/t history of seizures and refusing pills Transfer to hospice facility Call daughter [name] at [phone] for updates  Goals of Care and Additional Recommendations: Limitations on Scope of Treatment: Full Comfort Care  Code Status: DNR  Prognosis:  < 2 weeks  Discharge Planning: Hospice facility  Care plan was discussed with patient's daughter [name] and sister [name]  Thank you for allowing the Palliative Medicine Team to assist in the care of this patient.   Total Time 45 minutes Prolonged Time Billed  no       Greater than  50%  of this time was spent counseling and coordinating care related to the above assessment and plan.  Juel Burrow, DNP, [facility] Palliative Medicine Team Team Phone # [phone]  Pager [phone]

## 2021-03-12 NOTE — Progress Notes (Addendum)
[facility] Progress Note   Dialysis Orders: TTS [facility] 4h 400/500 37.5kg 2/2 bath RUA AVF/ RIJ TDC - sensipar 90 po tiw, on hold from 4/14 - mircera 150 q2 last 4.28 - calcitriol 1.25 ug tiw   Assessment/ Plan:    1. Shock- acute,hypotension event very shortly after initiating HD, BP's were in the70'sat HD unit. Sent to ED.Noted drop in hgb in ED, pRBC given.Continues to have hypotension with HD, midodrine added on dialysis days. 2. AMS - 2/2 seizure activity vs hypotension. Neurology consulted.  3. R arm pain/infiltration- Seen by VVS,infiltration noted but not enough bleeding to be cause of drop in Hgb. Plan to rest AVF x 2wks. Hb improved now at11.9s/p total 3 units since admission. 4. ESRD- on HD TTS.  - Next HD 5/26  Seen on HD 2K bath RIJ TC 132/60  Goal net UF 2L   5. BP/ volume -BPelevated this AM.MidodrineBID with extra does pre HD-wasn't given 5/21 and had hypotension on HD. On admit Vol overloaded on exam and by CT w/ significant ascites and areas of localized pitting edema.  Localized pitting edema improving, continue to titrate down volume as tolerated.   6. Seizures- takes keppra 7. Atrial Fib- on amiodarone, IV metoprolol. Not on AC due to prior ICH, hx of anemia 8. Anemia ckd- Hgb 11.2 Aranesp dose given on 5/14/22Now s/p 3units of blood since admit and improved to goal.  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 Binders and calcitriol-Sensipar on hold from outpatient; hypophosphatemic. Will recheck on  [place]. 10. Nutrition -changed to dysphagia diet due to spitting out food.  Continues to have poor intake -Cortrak placed 5/20. TF +protein supplements per RD    Subjective:   Seen in room. Awake, no new complaints. Nods head but confused.    Objective:   BP (!) 151/71 (BP Location: Left Arm)   Pulse 95   Temp 98.9 F (37.2 C) (Oral)   Resp 15   Ht 5' 0.98" (1.549 m)   Wt 45.4 kg   SpO2 96%   BMI 18.92 kg/m  No  intake or output data in the 24 hours ending 03/12/21 0723 Weight change: 1.5 kg  Physical Exam: General: chronically ill appearing, NGT in place   Heart: RRR Lungs: CTAB, nml WOB Abdomen: soft, NTND Extremities: no LE edema Dialysis Access: TDC, RU BCF +thrill, RUE swelling but not pulsatile  Imaging: No results found.  Labs: BMET Recent Labs  Lab 03/07/21 0252 03/07/21 [id number] 03/08/21 0253 03/09/21 0102 03/10/21 0103 03/11/21 0253 03/12/21 0142  NA 136 135 134* 134* 136 132* 132*  K 4.2 4.3 3.9 4.2 4.3 5.3* 5.6*  CL 99 98 98 100 99 97* 96*  CO2 '27 27 30 29 28 28 27  '$ GLUCOSE 65* 59* 115* 114* 141* 120* 119*  BUN 22* 23* 15 25* 23* 37* 54*  CREATININE 4.98* 5.36* 3.67* 4.33* 2.97* 3.70* 4.13*  CALCIUM 10.0 9.6 9.6 9.5 9.2 9.4 9.3  PHOS 4.2 4.4 3.9 4.0 1.6* 1.8* 1.3*   CBC Recent Labs  Lab 03/09/21 0102 03/10/21 0103 03/11/21 0253 03/12/21 0142  WBC 5.6 5.3 5.6 6.0  NEUTROABS 4.5 4.3 4.5 5.0  HGB 11.7* 11.2* 11.3* 10.9*  HCT 37.3 34.8* 35.1* 33.6*  MCV 88.2 87.2 86.2 85.3  PLT 91* 79* 100* 156    Medications:    . sodium chloride   Intravenous Once  . sodium chloride   Intravenous Once  . amiodarone  200 mg Oral Daily  .  calcitRIOL  0.5 mcg Oral Q T,Th,Sa-HD  . chlorhexidine  15 mL Mouth Rinse BID  . Chlorhexidine Gluconate Cloth  6 each Topical Q0600  . feeding supplement (PROSource TF)  45 mL Per Tube Daily  . free water  50 mL Per Tube Q6H  . levETIRAcetam  500 mg Per Tube BID  . mouth rinse  15 mL Mouth Rinse q12n4p  . metoprolol tartrate  2.5 mg Intravenous Q8H  . midodrine  10 mg Oral Q T,Th,Sa-HD  . midodrine  5 mg Oral BID WC  . multivitamin  1 tablet Per Tube QHS  . pantoprazole sodium  40 mg Per Tube Daily  . rosuvastatin  10 mg Oral Daily      Otelia Santee, MD 03/12/2021, 7:23 AM

## 2021-03-12 NOTE — Progress Notes (Addendum)
Manufacturing engineer [facility]) Hospital Liaison Note:  Update on this [place] Referral:  [facility] has a bed to offer this patient for transfer this afternoon pending completion of [employer] paper work.   An [facility] HLT had spoken to the patient's daughter [name] and family at bedside earlier today to explain hospice philosophy, discuss service and confirm interest. Once an [facility] has completed the admission paper work with family this note will be updated and patient will be ready for transport to [facility].  TOC aware.  Gar Ponto, RN [facility] HLT [phone]   UPDATE NOTE: [facility] SW having difficulty reaching family to complete [facility] registration paper work - TOC aware. Will continue to reach out to family (daughter [name] and sister [name]) to set up meeting and update this note.

## 2021-03-12 NOTE — Discharge Summary (Signed)
Physician Discharge Summary  [name] [id number] DOB: 28-Aug-1961 DOA: 02/28/2021  PCP: [name], No  Admit date: 02/28/2021 Discharge date: 03/12/2021  Admitted From: [name] Disposition: Residential Hospice  Recommendations for Outpatient Follow-up:  1. Follow up care per Hospice Protcol:  Home Health: No Equipment/Devices: None  Discharge Condition: Guarded  CODE STATUS:  DO NOT RESUSCITATE Diet recommendation: Dysphagia 2 Diet   Brief/Interim Summary: The patient is a extremely thin cachectic and chronically ill-appearing 60 year old African-American female with a past medical history significant for PAD, history of CVA, history of subarachnoid hemorrhage, history of seizures, history of proximal atrial fibrillation, history of chronic urinary issues, hyperlipidemia, hypertension, ESRD on hemodialysis, COPD as well as other comorbidities who presented to ED from her hemodialysis unit after right arm AV fistula infiltrated and her blood pressure dropped.  She became extremely altered and hemoglobin in the ER returned to 7.4 which is down from 13.  She was admitted to the ICU and got 1 unit PRBCs and seen by vascular surgery stabilized and transferred to [facility] on 03/02/2021 with a hemoglobin 6.9.  Subsequently she received 2 more units and hemoglobin improved and is now stable.  Her p.o. and intake has been extremely marginal and poor.  She has failure to thrive and palliative care has been consulted for goals of care discussion however we cannot get in touch with the patient's family members at all.  Nursing stated continues to spit out her medications and report p.o. intake. Calorie count done and after discussion with the nutritionist she is unlikely to meet her nutritional needs so we will go ahead and place a Cortrak for tube feeding purposes and reevaluate Monday if her nutritional status is any better while she gets TF.  Her mentation continues to wax and wane so Neurology was  consulted for further evaluation and Recc's.  Neurology recommended work-up but felt that this is chronic and had nothing else to offer.  Reassured patient and daughter followed today and 3 L were placed.  After further goals of care discussion with myself and the palliative team is no patient's family decided to transition her to comfort measures and keep her comfortable.  All medications and interventions not the patient's comfort were discontinued.  A referral was made to hospice and she has been deemed stable to be discharged to hospice at this time given her poor prognosis  Discharge Diagnoses:  Active Problems:   Hemorrhagic shock (HCC)  Acute metabolic encephalopathy and hemodialysis with hypotension/shock and acute drop in hemoglobin -Initially there was a question of her right arm AV fistula played, she also has a history of underlying seizures and subdural hematoma -She is more somnolent and withdrawn today and nephrology does not feel that she is at her baseline.  She continues have extremely poor p.o. intake and her calorie count reveals that she is not meeting her nutritional needs so we will place a core track for tube feedings -may discuss with neurology about her mental status -She had a hemoglobin of 6.9 initially but this is improved and her last hemoglobin/hematocrit was 13.0/40.7 -A DIC panel was checked including LDH, haptoglobin and occult stool and a CT of the abdomen pelvis was done to rule out any retroperitoneal loss -She received 1 unit of PRBCs in ICU and she was given 2 more units of PRBCs with hemodialysis on 03/02/2021 -Echocardiogram has been checked to ensure valves are stable -Continue to monitor her mentation status carefully and will order a calorie count and a dietitian  consult -she continues to be hypotensive with hemodialysis but midodrine has been added back on dialysis days which is improved -Will Check EEG today and Last Head CT was 02/28/21 and showed "Stable  postoperative changes. Stable position of ventriculoperitoneal shunt with tip posterior to the third ventricle in the left midbrain region. Stable ventricular enlargement. Multiple prior infarcts, largest in the left parietal lobe region. No acute infarct evident. No mass, hemorrhage, or appreciable extra-axial fluid collections. Multiple foci of arterial vascular calcification evident."  -EEG done and showed the study was suggestive of cortical dysfunction arising from the left frontotemporal region likely secondary to underlying structural abnormalities as well as a mild diffuse encephalopathy which is nonspecific in etiology with no seizures or epileptiform discharges seen throughout the whole reading. -Repeat head CT and showed "Stable chronic findings as described above. No acute intracranial abnormality seen." -Her mentation waxes and wanes and so neurology was consulted for further evaluation and they are pursuing a metabolic work-up but do suspect this is secondary to her poor p.o. intake and failure to thrive. -We will need to continue to have goals of care discussion and will continue her Cortrak tube feedings at this time -Neurology not pursing MRI given her Shunt Coils and feel that the MRI will not change much in terms of her outcome  -Continue with goals of care discussion and since she has an extremely poor prognosis and her nutritional intake is not very well we discussed with the patient's daughter [name] and the patient Sister [name] and they want the patient to be comfortable and all interventions not in the patient's comfort were discontinued and a referral was made for a residential hospice placement  History of seizures, bilateral SDH and VP shunt -He has no headache and a supple neck and she is continue Keppra which she takes at home but because of her poor p.o. intake we changes to IV but now will be discontinued given that she is going comfort measures -We will continue monitor  closely and there is a question if she had a fluid imbalance induced hypotension causing seizure activity at her dialysis unit causing her AMS -Checked EEG as above -She is intermittently waxing and waning and was more withdrawn today.  Neurology has been consulted as above and have signed off the case -No further interventions now that she is going to be transitioned to comfort care  Acute anemia superimposed on anemia of chronic kidney disease -It is unclear if she had a lab error that showed a low hemoglobin hematocrit.  After 2 units of PRBCs her hemoglobin trended up to 11 and is now stable at 13 as above -Patient is Hgb/Hct  -CT of the abdomen and pelvis showed no retroperitoneal bleed and the echocardiogram of the abdominal aorta was without any major valvular abnormalities and there is no obvious source of ongoing bleeding -Hemoglobin/hematocrit is now stable -Continue monitor for signs and symptoms of bleeding; no overt bleeding noted  GERD -Changed PPI to IV given her poor p.o. intake but will now discontinue this  Right arm AV Fistula Infiltration -Seen by vascular surgery and they feel that the infiltration of the hematoma on the right arm is not impressive and this would not profoundly explain her anemia in the plan to rest the fistula for about 2 weeks and dialyze via a right IJ [facility] -Dr. [name] feels that the aVF may have a pseudoaneurysm but this does not need urgent intervention -Right Upper Extremity Duplex negative for DVT -We  will not have her follow-up given that she is going comfort care now  Severe failure to thrive and deconditioning -PT OT evaluated and recommending SNF next-patient is a very poor p.o. intake so we have consulted nutritionist and had started a calorie count -Palliative care has been called for goals of care discussion after further discussion she is going to be made comfort care -SLP has been consulted for evaluation and they have placed her on a  dysphagia 2 diet with thin liquid; her diet has been changed to dysphagia diet due to her spitting out food and meds  ESRD on Tuesday Thursday Saturday  Hyperkalemia Hypophosphatemia -Nephrology is following and patient got dialyzed through a right IJ dialysis catheter since her right AV fistula has infiltrated and she has been seen by the vascular surgery.  If she has been repaired and they are recommending rest at this time -Patient's BUN/Cr was now 54/1.13 this morning -continue with calcitriol 0.5 mcg every Tuesday Thursday Saturday and continue with calcium acetate 667 p.o. 3 times daily with meals  -Patient is sodium is now 132, potassium is 5.3, and phosphorus is 1.3 -Likely to be corrected in dialysis but nephrology is on the plan on giving a single dose of Lokelma today -We monitored and trended her renal function carefully -Appreciate nephrology evaluation and recommendations as she is tolerating hemodialysis well and having ultrafiltration as tolerated -We will not pursue any more dialysis given that the goals of care have been shifted towards comfort  Proximal Atrial Fibrillation -Continue with Metoprolol for rate control and Amiodarone -She is not a candidate for anticoagulation given her intracranial hemorrhage/subarachnoid hemorrhage/anemia -Head CT done as above; may pursue MRI but will await neurology recommendations  History of right lower extremity DVT -Age is indeterminate and she is not a candidate for anticoagulation due to her subdural bleeds and previous admissions IR was consulted for IVC filter but this was not recommended at that time. -Continue monitor at hospice home  Hypothyroidism -She was on a beta-blocker and apparently methimazole discontinued last admission -She has a stable TSH and will not check anymore given that she is being transitioned to comfort care  Anasarca with ascites and effusion due to severe protein calorie malnutrition and extremely  poor nutritional status -She is asymptomatic and she is on supplements  -Nutritionist consulted for further evaluation and she is now getting calorie count and because she did not pass will get a Cortrak but now this has been discontinued given her goals of care discussion today  Goals of care -Her baseline quality of life seems to be extremely poor due to her brain injury and extremely poor nutritional status as well as multiple comorbidities including ESRD.  She is extremely poor functional status and we have consulted palliative care for goals of care consultation and discussion -Initially unable to reach family because the numbers were not right in the chart but daughter at bedside today. -We had placed a Cortrak given inadequate Nutritional Intake and after further goals of care discussion with the patient's daughter and her sister no shift of care focus towards comfort and all medications and interventions were discontinued on the patient's comfort and referral was made to residential hospice   Hypertension -On nondialysis days her systolics are between [id number] and 140 requiring low-dose beta-blockers however she extremely sensitive to fluid shifts and on the days of dialysis her systolic falls below 80.  She gets midodrine during the dialysis times and nephrology is following closely -Continue monitor  blood pressures per protocol neurology is trying to maximize ultrafiltration and titrate down volume as tolerated -Last BP was 147/52  Hyperlipidemia -Discontinue Rosuvastatin 10 mg p.o. daily now that she is comfort Care  Discharge Instructions  Discharge Instructions    Call MD for:  difficulty breathing, headache or visual disturbances   Complete by: As directed    Call MD for:  extreme fatigue   Complete by: As directed    Call MD for:  hives   Complete by: As directed    Call MD for:  persistant dizziness or light-headedness   Complete by: As directed    Call MD for:  persistant  nausea and vomiting   Complete by: As directed    Call MD for:  redness, tenderness, or signs of infection (pain, swelling, redness, odor or green/yellow discharge around incision site)   Complete by: As directed    Call MD for:  severe uncontrolled pain   Complete by: As directed    Call MD for:  temperature >100.4   Complete by: As directed    Diet - low sodium heart healthy   Complete by: As directed    DYSPHAGIA 2 Diet   Discharge instructions   Complete by: As directed    You were cared for by a hospitalist during your hospital stay. If you have any questions about your discharge medications or the care you received while you were in the hospital after you are discharged, you can call the unit and ask to speak with the hospitalist on call if the hospitalist that took care of you is not available. Once you are discharged, your primary care physician will handle any further medical issues. Please note that NO REFILLS for any discharge medications will be authorized once you are discharged, as it is imperative that you return to your primary care physician (or establish a relationship with a primary care physician if you do not have one) for your aftercare needs so that they can reassess your need for medications and monitor your lab values.  Follow up care per Hospice Protocol. Take all medications as prescribed. If symptoms change or worsen please return to the ED for evaluation   Discharge wound care:   Complete by: As directed    Frequent Turning   Increase activity slowly   Complete by: As directed      Allergies as of 03/12/2021      Reactions   Amoxicillin Hives, Rash, Other (See Comments)   Informed by pt's daughter [name] and "Allergic," per [facility]   Penicillins Hives, Rash, Other (See Comments)   "Allergic," per [facility] Has patient had a PCN reaction causing immediate rash, facial/tongue/throat swelling, SOB or lightheadedness with hypotension: Yes Has patient had a PCN  reaction causing severe rash involving mucus membranes or skin necrosis: No Has patient had a PCN reaction that required hospitalization: No Has patient had a PCN reaction occurring within the last 10 years: No If all of the above answers are "NO", then may proceed with Cephalosporin use. PATIENT TOLERATED CEFAZOLIN IN OR 1/27/      Medication List    STOP taking these medications   amiodarone 200 MG tablet Commonly known as: PACERONE   aspirin 81 MG EC tablet   calcitRIOL 0.5 MCG capsule Commonly known as: ROCALTROL   calcium acetate 667 MG capsule Commonly known as: PHOSLO   docusate sodium 100 MG capsule Commonly known as: COLACE   feeding supplement (NEPRO CARB STEADY) Liqd  levETIRAcetam 500 MG tablet Commonly known as: KEPPRA   metoprolol tartrate 25 MG tablet Commonly known as: LOPRESSOR   midodrine 10 MG tablet Commonly known as: PROAMATINE   multivitamin Tabs tablet   nitroGLYCERIN 0.4 MG SL tablet Commonly known as: NITROSTAT   OXYGEN   pantoprazole 40 MG tablet Commonly known as: PROTONIX   polyethylene glycol 17 g packet Commonly known as: MIRALAX / GLYCOLAX   rosuvastatin 10 MG tablet Commonly known as: CRESTOR   Velphoro 500 MG chewable tablet Generic drug: sucroferric oxyhydroxide     TAKE these medications   acetaminophen 325 MG tablet Commonly known as: TYLENOL Take 2 tablets (650 mg total) by mouth every 6 (six) hours as needed for mild pain (or Fever >/= 101).   antiseptic oral rinse Liqd Apply 15 mLs topically as needed for dry mouth.   glycopyrrolate 1 MG tablet Commonly known as: ROBINUL Take 1 tablet (1 mg total) by mouth every 4 (four) hours as needed (excessive secretions).   haloperidol 0.5 MG tablet Commonly known as: HALDOL Take 1 tablet (0.5 mg total) by mouth every 4 (four) hours as needed for agitation (or delirium).   LORazepam 1 MG tablet Commonly known as: ATIVAN Take 1 tablet (1 mg total) by mouth every 4  (four) hours as needed for anxiety.   polyvinyl alcohol 1.4 % ophthalmic solution Commonly known as: LIQUIFILM TEARS Place 1 drop into both eyes 4 (four) times daily as needed for dry eyes.            Discharge Care Instructions  (From admission, onward)         Start     Ordered   03/12/21 0000  Discharge wound care:       Comments: Frequent Turning   03/12/21 1624          Allergies  Allergen Reactions  . Amoxicillin Hives, Rash and Other (See Comments)    Informed by pt's daughter [name] and "Allergic," per [facility]  . Penicillins Hives, Rash and Other (See Comments)    "Allergic," per [facility] Has patient had a PCN reaction causing immediate rash, facial/tongue/throat swelling, SOB or lightheadedness with hypotension: Yes Has patient had a PCN reaction causing severe rash involving mucus membranes or skin necrosis: No Has patient had a PCN reaction that required hospitalization: No Has patient had a PCN reaction occurring within the last 10 years: No If all of the above answers are "NO", then may proceed with Cephalosporin use. PATIENT TOLERATED CEFAZOLIN IN OR 1/27/   Consultations:  Nephrology  Palliative Care Medicine   Neurology   Procedures/Studies: CT ABDOMEN PELVIS WO CONTRAST  Result Date: 03/02/2021 CLINICAL DATA:  Anemia, evaluate for retroperitoneal bleed EXAM: CT ABDOMEN AND PELVIS WITHOUT CONTRAST TECHNIQUE: Multidetector CT imaging of the abdomen and pelvis was performed following the standard protocol without IV contrast. COMPARISON:  11/09/2020 FINDINGS: Lower chest: Moderate bilateral pleural effusions and associated atelectasis or consolidation. Cardiomegaly. Extensive 3 vessel coronary artery calcifications. Hepatobiliary: No solid liver abnormality is seen. Numerous low-attenuation lesions throughout the liver, incompletely characterized although most likely simple cysts. No gallstones, gallbladder wall thickening, or biliary dilatation.  Pancreas: Unremarkable. No pancreatic ductal dilatation or surrounding inflammatory changes. Spleen: Normal in size without significant abnormality. Adrenals/Urinary Tract: Adrenal glands are unremarkable. Atrophic appearance of the kidneys. Multiple bilateral small calculi and/or vascular calcifications. No hydronephrosis. Bladder is unremarkable. Stomach/Bowel: Stomach is within normal limits. Appendix appears normal. No evidence of bowel wall thickening, distention, or inflammatory changes.  Vascular/Lymphatic: Severe, pipe like aortic atherosclerosis. No enlarged abdominal or pelvic lymph nodes. Reproductive: Status post hysterectomy. Other: No abdominal wall hernia or abnormality. Anasarca. Large volume ascites throughout the abdomen and pelvis. Shunt catheter tubing gently looped around the abdomen, tip position in the posterior right hemipelvis (series 3, image 82). Musculoskeletal: No acute or significant osseous findings. IMPRESSION: 1. No evidence of retroperitoneal hemorrhage. 2. Large volume simple fluid attenuation ascites throughout the abdomen and pelvis. Anasarca. 3. Moderate bilateral pleural effusions and associated atelectasis or consolidation. 4. Cardiomegaly and coronary artery disease. 5. Numerous low-attenuation lesions throughout the liver, incompletely characterized although most likely simple cysts. Aortic Atherosclerosis (ICD10-I70.0). Electronically Signed   By: Diane Candle M.D.   On: 03/02/2021 16:10   DG Chest 1 View  Result Date: 02/28/2021 CLINICAL DATA:  The patient became unresponsive during dialysis today. EXAM: CHEST  1 VIEW COMPARISON:  11/28/2020 FINDINGS: Stable right jugular double-lumen catheter, ventriculoperitoneal shunt tubing and enlarged cardiac silhouette. The aorta remains tortuous and partially calcified. No significant change in a moderate-sized right pleural effusion. Decreased associated right basilar atelectasis. Clear left lung. Diffuse osteopenia.  IMPRESSION: 1. No acute abnormality. 2. Stable moderate-sized right pleural effusion. 3. Stable cardiomegaly and decreased right basilar atelectasis. Electronically Signed   By: Claudie Revering M.D.   On: 02/28/2021 14:43   CT HEAD WO CONTRAST  Result Date: 03/08/2021 CLINICAL DATA:  Altered mental status. EXAM: CT HEAD WITHOUT CONTRAST TECHNIQUE: Contiguous axial images were obtained from the base of the skull through the vertex without intravenous contrast. COMPARISON:  Feb 28, 2021. FINDINGS: Brain: Stable left posterior parietal encephalomalacia. Stable old left cerebellar infarction. Right frontal ventriculostomy catheter is noted with distal tip in the third ventricle. Stable ventricular size is noted compared to prior exam. No midline shift is noted. No acute infarction or hemorrhage is noted. Vascular: No hyperdense vessel or unexpected calcification. Skull: Status post right frontal craniotomy. No acute abnormality is noted. Sinuses/Orbits: No acute finding. Other: None. IMPRESSION: Stable chronic findings as described above. No acute intracranial abnormality seen. Electronically Signed   By: Marijo Conception M.D.   On: 03/08/2021 18:29   CT Head Wo Contrast  Result Date: 02/28/2021 CLINICAL DATA:  Altered mental status EXAM: CT HEAD WITHOUT CONTRAST TECHNIQUE: Contiguous axial images were obtained from the base of the skull through the vertex without intravenous contrast. COMPARISON:  November 28, 2020, November 19, 2020 common November 17, 2020 FINDINGS: Brain: There is a ventriculostomy catheter inserted from the right frontal region with the tip in the left mid brain slightly posterior to the third ventricle, unchanged. There remains moderate generalized ventricular prominence. There is a degree of underlying sulcal atrophy, stable. There is no intracranial mass or hemorrhage. Previous small extra-axial fluid collections are no longer evident. No subdural or epidural fluid collections are appreciable  currently. There is no midline shift. There is evidence of encephalomalacia in the left parietal lobe consistent with prior infarct, stable. There is small vessel disease throughout the centra semiovale bilaterally, stable. There are posterior cerebellar infarcts bilaterally, stable. There is evidence of a prior infarct involving the anterior limbs of the left external and extreme capsules with infarct in the anterior left claustrum. No acute appearing infarct is evident. Vascular: Apparent apparent coil at basilar tip level is stable. There is a clip in the right middle cerebral artery distribution. There is calcification in each carotid siphon region. There is also calcification in each distal vertebral artery. No hyperdense vessel evident. Skull:  Evidence of previous craniotomy on the right in the mid to posterior frontal region. No new bone lesions. Sinuses/Orbits: Paranasal sinuses clear. Orbits appear symmetric bilaterally. Other: Mastoid air cells clear. IMPRESSION: Stable postoperative changes. Stable position of ventriculoperitoneal shunt with tip posterior to the third ventricle in the left midbrain region. Stable ventricular enlargement. Multiple prior infarcts, largest in the left parietal lobe region. No acute infarct evident. No mass, hemorrhage, or appreciable extra-axial fluid collections. Multiple foci of arterial vascular calcification evident. Electronically Signed   By: Lowella Grip III M.D.   On: 02/28/2021 16:06   DG CHEST PORT 1 VIEW  Result Date: 03/08/2021 CLINICAL DATA:  Post feeding tube placement. EXAM: PORTABLE CHEST 1 VIEW COMPARISON:  02/28/2021 FINDINGS: Grossly unchanged cardiac silhouette and mediastinal contours with atherosclerotic plaque within the thoracic aorta. Loop recorder device overlies the left heart border. Pulmonary vasculature remains indistinct with cephalization of flow. Unchanged small layering right-sided effusion with associated right basilar opacities.  Worsening left basilar/retrocardiac opacities. Interval placement enteric tube with tip overlying expected location of the gastric antrum. Otherwise, stable positioning of support apparatus. No pneumothorax. No acute osseous abnormalities. IMPRESSION: 1. Enteric tube tip projects over the expected location of the gastric antrum. 2. Stable position of remaining support apparatus.  No pneumothorax. 3. Similar findings of pulmonary edema small right-sided effusion and associated right basilar opacities, atelectasis versus infiltrate. 4. Worsening left basilar/retrocardiac opacities, atelectasis versus infiltrate. Electronically Signed   By: Sandi Mariscal M.D.   On: 03/08/2021 16:09   DG Abd Portable 1V  Result Date: 03/08/2021 CLINICAL DATA:  Evaluate feeding tube placement EXAM: PORTABLE ABDOMEN - 1 VIEW COMPARISON:  None. FINDINGS: There is a feeding tube with tip in the projection of the gastric antrum. Right-sided ventriculoperitoneal shunt tubing is noted. The visualized portions of the tubing appear intact. No dilated bowel loops identified. Moderate stool noted within the colon. IMPRESSION: Feeding tube tip is in the gastric antrum. Electronically Signed   By: Kerby Moors M.D.   On: 03/08/2021 15:32   EEG adult  Result Date: 03/08/2021 Lora Havens, MD     03/08/2021  4:47 PM Patient Name: Ruksana Osinski MRN: [id number] Epilepsy Attending: Lora Havens Referring Physician/Provider: Dr Carlisle Cater Date: 03/08/2021 Duration: 23.45 mins Patient history: 60yo F with ams. EEG to evaluate for seizur Level of alertness: Awake, asleep AEDs during EEG study: LEV Technical aspects: This EEG study was done with scalp electrodes positioned according to the 10-20 International system of electrode placement. Electrical activity was acquired at a sampling rate of '500Hz'$  and reviewed with a high frequency filter of '70Hz'$  and a low frequency filter of '1Hz'$ . EEG data were recorded continuously and digitally  stored. Description: The posterior dominant rhythm consists of 7.5 Hz activity of moderate voltage (25-35 uV) seen predominantly in posterior head regions, symmetric and reactive to eye opening and eye closing. Sleep was characterized by vertex waves, sleep spindles (12 to 14 Hz), maximal frontocentral region.  EEG showed continuous generalized and maximal left frontotemporal region theta-delta slowing.  Hyperventilation and photic stimulation were not performed.   ABNORMALITY - Continuous slow, generalized and maximal left frontotemporal region IMPRESSION: This study is suggestive of cortical dysfunction arising from left fronto-temporal region likely secondary to underlying structural abnormality as well as mild diffuse encephalopathy, nonspecific etiology. No seizures or epileptiform discharges were seen throughout the recording. Lora Havens   ECHOCARDIOGRAM COMPLETE  Result Date: 03/03/2021    ECHOCARDIOGRAM REPORT   Patient Name:  [name] Date of Exam: 03/03/2021 Medical Rec #:  [id number]    Height:       61.0 in Accession #:    [id number]   Weight:       104.9 lb Date of Birth:  [birth year]    BSA:          1.436 m Patient Age:    38 years     BP:           140/58 mmHg Patient Gender: F            HR:           64 bpm. Exam Location:  Inpatient Procedure: 2D Echo, Cardiac Doppler and Color Doppler Indications:    CHF-Acute Diastolic [id number] / [id number]  History:        Patient has prior history of Echocardiogram examinations, most                 recent 10/07/2020. CHF, CAD, Signs/Symptoms:Syncope and Dyspnea;                 Risk Factors:Hypertension, Dyslipidemia and Current Smoker.                 Altered Mental Status, End-stage renal disease on hemodialysis                 ([place]).  Sonographer:    Alvino Chapel RCS Referring Phys: [id number] Margaree Mackintosh [facility]  Sonographer Comments: Patient could NOT sniff. IMPRESSIONS  1. Left ventricular ejection fraction, by estimation, is 55 to 60%. The left ventricle  has normal function. The left ventricle demonstrates regional wall motion abnormalities (see scoring diagram/findings for description). There is moderate asymmetric left ventricular hypertrophy of the septal segment. Left ventricular diastolic parameters are consistent with Grade II diastolic dysfunction (pseudonormalization).  2. Right ventricular systolic function is normal. The right ventricular size is normal. There is mildly elevated pulmonary artery systolic pressure. The estimated right ventricular systolic pressure is [id number] mmHg.  3. Left atrial size was moderately dilated.  4. Left pleural effusion present.  5. The mitral valve is grossly normal, mildly thickened. Mild mitral valve regurgitation.  6. The aortic valve is tricuspid. There is mild calcification of the aortic valve. Aortic valve regurgitation is not visualized. Mild aortic valve sclerosis is present, with no evidence of aortic valve stenosis.  7. The inferior vena cava is normal in size with <50% respiratory variability, suggesting right atrial pressure of 8 mmHg. FINDINGS  Left Ventricle: Left ventricular ejection fraction, by estimation, is 55 to 60%. The left ventricle has normal function. The left ventricle demonstrates regional wall motion abnormalities. The left ventricular internal cavity size was normal in size. There is moderate asymmetric left ventricular hypertrophy of the septal segment. Left ventricular diastolic parameters are consistent with Grade II diastolic dysfunction (pseudonormalization).  LV Wall Scoring: The basal inferior segment is hypokinetic. The entire anterior wall, entire lateral wall, entire septum, entire apex, and mid and distal inferior wall are normal. Right Ventricle: The right ventricular size is normal. No increase in right ventricular wall thickness. Right ventricular systolic function is normal. There is mildly elevated pulmonary artery systolic pressure. The tricuspid regurgitant velocity is 2.78  m/s,  and with an assumed right atrial pressure of 8 mmHg, the estimated right ventricular systolic pressure is [id number] mmHg. Left Atrium: Left atrial size was moderately dilated. Right Atrium: Right atrial size was normal in size. Pericardium: Left pleural effusion present. There is no evidence of  pericardial effusion. Mitral Valve: The mitral valve is grossly normal. There is mild thickening of the mitral valve leaflet(s). Mild mitral valve regurgitation. Tricuspid Valve: The tricuspid valve is grossly normal. Tricuspid valve regurgitation is mild. Aortic Valve: The aortic valve is tricuspid. There is mild calcification of the aortic valve. There is mild aortic valve annular calcification. Aortic valve regurgitation is not visualized. Mild aortic valve sclerosis is present, with no evidence of aortic valve stenosis. Pulmonic Valve: The pulmonic valve was grossly normal. Pulmonic valve regurgitation is trivial. Aorta: The aortic root is normal in size and structure. Venous: The inferior vena cava is normal in size with less than 50% respiratory variability, suggesting right atrial pressure of 8 mmHg. IAS/Shunts: No atrial level shunt detected by color flow Doppler.  LEFT VENTRICLE PLAX 2D LVIDd:         4.00 cm  Diastology LVIDs:         2.80 cm  LV e' medial:    4.90 cm/s LV PW:         1.10 cm  LV E/e' medial:  22.9 LV IVS:        1.40 cm  LV e' lateral:   9.46 cm/s LVOT diam:     1.70 cm  LV E/e' lateral: 11.8 LV SV:         50 LV SV Index:   35 LVOT Area:     2.27 cm  RIGHT VENTRICLE RV S prime:     8.81 cm/s TAPSE (M-mode): 1.5 cm LEFT ATRIUM             Index       RIGHT ATRIUM           Index LA diam:        3.80 cm 2.65 cm/m  RA Area:     15.50 cm LA Vol (A2C):   55.2 ml 38.44 ml/m RA Volume:   36.10 ml  25.14 ml/m LA Vol (A4C):   62.1 ml 43.25 ml/m LA Biplane Vol: 61.6 ml 42.90 ml/m  AORTIC VALVE LVOT Vmax:   102.00 cm/s LVOT Vmean:  57.400 cm/s LVOT VTI:    0.221 m  AORTA Ao Root diam: 2.90 cm MITRAL VALVE                 TRICUSPID VALVE MV Area (PHT): 4.80 cm     TR Peak grad:   30.9 mmHg MV Decel Time: 158 msec     TR Vmax:        278.00 cm/s MV E velocity: 112.00 cm/s MV A velocity: 94.70 cm/s   SHUNTS MV E/A ratio:  1.18         Systemic VTI:  0.22 m                             Systemic Diam: 1.70 cm Rozann Lesches MD Electronically signed by Rozann Lesches MD Signature Date/Time: 03/03/2021/2:24:36 PM    Final    VAS [place] UPPER EXTREMITY VENOUS DUPLEX  Result Date: 03/08/2021 UPPER VENOUS STUDY  Patient Name:  DNASIA GRITZMACHER  Date of Exam:   03/08/2021 Medical Rec #: [id number]     Accession #:    [id number] Date of Birth: [birth year]     Patient Gender: F Patient Age:   72Y Exam Location:  [facility] Procedure:      VAS [place] UPPER EXTREMITY VENOUS DUPLEX Referring Phys: [id number] [place] --------------------------------------------------------------------------------  Indications: Swelling Risk Factors: Surgery LUE brachial-basilic AVF. Limitations: Body habitus, poor ultrasound/tissue interface, bandages, line and patient positioning, patient immobility, patient pain tolerance. Comparison Study: No prior studies. Performing Technologist: Oliver Hum RVT  Examination Guidelines: A complete evaluation includes B-mode imaging, spectral Doppler, color Doppler, and power Doppler as needed of all accessible portions of each vessel. Bilateral testing is considered an integral part of a complete examination. Limited examinations for reoccurring indications may be performed as noted.  Right Findings: +----------+------------+---------+-----------+----------+--------------+ RIGHT     CompressiblePhasicitySpontaneousProperties   Summary     +----------+------------+---------+-----------+----------+--------------+ IJV           Full       Yes       Yes                             +----------+------------+---------+-----------+----------+--------------+ Subclavian                                           Not visualized +----------+------------+---------+-----------+----------+--------------+ Axillary      None       No        No                    AVF       +----------+------------+---------+-----------+----------+--------------+ Brachial    Partial      Yes       Yes                   AVF       +----------+------------+---------+-----------+----------+--------------+ Radial        Full                                                 +----------+------------+---------+-----------+----------+--------------+ Ulnar         Full                                                 +----------+------------+---------+-----------+----------+--------------+ Cephalic      Full                                                 +----------+------------+---------+-----------+----------+--------------+ Basilic       None                                       AVF       +----------+------------+---------+-----------+----------+--------------+ Noncompressible vessels are due to the AVF. An area of mixed echogenicity is noted adjacent to the AVF in the mid upper arm.  Left Findings: +----------+------------+---------+-----------+----------+-------+ LEFT      CompressiblePhasicitySpontaneousPropertiesSummary +----------+------------+---------+-----------+----------+-------+ Subclavian    Full       Yes       Yes                      +----------+------------+---------+-----------+----------+-------+  Summary:  Right: No  evidence of deep vein thrombosis in the upper extremity. No evidence of superficial vein thrombosis in the upper extremity. Noncompressible vessels are due to the AVF. An area of mixed echogenicity is noted adjacent to the AVF in the mid upper arm.  Left: No evidence of thrombosis in the subclavian.  *See table(s) above for measurements and observations.  Diagnosing physician: Deitra Mayo MD Electronically signed by Deitra Mayo MD on  03/08/2021 at 8:19:12 PM.    Final     Subjective: Seen and examined in dialysis and she only nods to questioning.  She appeared calm and was more awake today than she was yesterday.  The case was discussed extensively with her daughter and her sister were at bedside and after further goals of care discussion the plan was made to transition the patient to comfort measures given her extreme poor prognosis and her lack of oral intake.  Family wished for no further intervention and did not want a PEG tube placement.  She was transitioned to full comfort measures and a referral was made to residential hospice.  She is stable to be discharged at this time   Discharge Exam: Vitals:   03/12/21 1148 03/12/21 1216  BP: (!) 128/57 (!) 147/52  Pulse: 85 92  Resp:  18  Temp: 97.6 F (36.4 C) 98 F (36.7 C)  SpO2: 99% 96%   Vitals:   03/12/21 1100 03/12/21 1130 03/12/21 1148 03/12/21 1216  BP: 134/61 (!) 136/58 (!) 128/57 (!) 147/52  Pulse: 90  85 92  Resp:    18  Temp:   97.6 F (36.4 C) 98 F (36.7 C)  TempSrc:   Axillary Oral  SpO2:   99% 96%  Weight:   43.5 kg   Height:       General: Pt is awake, not in acute distress and nods yes and no to questioning Cardiovascular: RRR, S1/S2 +, no rubs, no gallops Respiratory: Diminished bilaterally, no wheezing, no rhonchi Abdominal: Soft, NT, Slightly distended, bowel sounds + Extremities: no edema, no cyanosis  The results of significant diagnostics from this hospitalization (including imaging, microbiology, ancillary and laboratory) are listed below for reference.    Microbiology: No results found for this or any previous visit (from the past 240 hour(s)).   Labs: BNP (last 3 results) Recent Labs    11/11/20 0530 11/11/20 2110 11/13/20 1027  BNP 3,906.9* >4,500.0* A999333*   Basic Metabolic Panel: Recent Labs  Lab 03/08/21 0253 03/09/21 0102 03/10/21 0103 03/11/21 0253 03/12/21 0142  NA 134* 134* 136 132* 132*  K 3.9 4.2 4.3  5.3* 5.6*  CL 98 100 99 97* 96*  CO2 '30 29 28 28 27  '$ GLUCOSE 115* 114* 141* 120* 119*  BUN 15 25* 23* 37* 54*  CREATININE 3.67* 4.33* 2.97* 3.70* 4.13*  CALCIUM 9.6 9.5 9.2 9.4 9.3  MG 2.1 2.2 1.8 1.8 1.8  PHOS 3.9 4.0 1.6* 1.8* 1.3*   Liver Function Tests: Recent Labs  Lab 03/08/21 0253 03/09/21 0102 03/10/21 0103 03/11/21 0253 03/12/21 0142  AST 12* '16 16 26 28  '$ ALT '8 9 10 16 19  '$ ALKPHOS 50 49 50 45 47  BILITOT 0.4 0.4 0.3 0.7 1.1  PROT 5.1* 4.8* 5.0* 5.0* 5.5*  ALBUMIN 1.5* 1.4* 1.4* 1.4* 1.4*   No results for input(s): LIPASE, AMYLASE in the last 168 hours. Recent Labs  Lab 03/08/21 1801  AMMONIA 16   CBC: Recent Labs  Lab 03/08/21 0253 03/09/21 0102 03/10/21 0103 03/11/21 0253 03/12/21  0142  WBC 5.4 5.6 5.3 5.6 6.0  NEUTROABS 4.0 4.5 4.3 4.5 5.0  HGB 11.9* 11.7* 11.2* 11.3* 10.9*  HCT 36.9 37.3 34.8* 35.1* 33.6*  MCV 86.4 88.2 87.2 86.2 85.3  PLT 85* 91* 79* 100* 156   Cardiac Enzymes: No results for input(s): CKTOTAL, CKMB, CKMBINDEX, TROPONINI in the last 168 hours. BNP: Invalid input(s): POCBNP CBG: Recent Labs  Lab 03/11/21 1638 03/11/21 1957 03/11/21 2344 03/12/21 0324 03/12/21 0721  GLUCAP 109* 105* 112* 88 108*   D-Dimer No results for input(s): DDIMER in the last 72 hours. Hgb A1c No results for input(s): HGBA1C in the last 72 hours. Lipid Profile No results for input(s): CHOL, HDL, LDLCALC, TRIG, CHOLHDL, LDLDIRECT in the last 72 hours. Thyroid function studies No results for input(s): TSH, T4TOTAL, T3FREE, THYROIDAB in the last 72 hours.  Invalid input(s): FREET3 Anemia work up No results for input(s): VITAMINB12, FOLATE, FERRITIN, TIBC, IRON, RETICCTPCT in the last 72 hours. Urinalysis    Component Value Date/Time   COLORURINE YELLOW 11/08/2013 1304   APPEARANCEUR CLEAR 11/08/2013 1304   LABSPEC 1.009 11/08/2013 1304   PHURINE 7.5 11/08/2013 1304   GLUCOSEU NEGATIVE 11/08/2013 1304   HGBUR NEGATIVE 11/08/2013 1304    BILIRUBINUR NEGATIVE 11/08/2013 1304   KETONESUR NEGATIVE 11/08/2013 1304   PROTEINUR NEGATIVE 11/08/2013 1304   UROBILINOGEN 0.2 11/08/2013 1304   NITRITE NEGATIVE 11/08/2013 1304   LEUKOCYTESUR NEGATIVE 11/08/2013 1304   Sepsis Labs Invalid input(s): PROCALCITONIN,  WBC,  LACTICIDVEN Microbiology No results found for this or any previous visit (from the past 240 hour(s)).  Time coordinating discharge: 35 minutes  SIGNED:  Kerney Elbe, DO [facility] 03/12/2021, 4:31 PM Pager is on [place]  If 7PM-7AM, please contact night-coverage [email or web address]

## 2021-03-13 DIAGNOSIS — Z66 Do not resuscitate: Secondary | ICD-10-CM | POA: Diagnosis not present

## 2021-03-13 DIAGNOSIS — N186 End stage renal disease: Secondary | ICD-10-CM | POA: Diagnosis not present

## 2021-03-13 DIAGNOSIS — Z992 Dependence on renal dialysis: Secondary | ICD-10-CM | POA: Diagnosis not present

## 2021-03-13 DIAGNOSIS — Z515 Encounter for palliative care: Secondary | ICD-10-CM | POA: Diagnosis not present

## 2021-03-13 LAB — VITAMIN B1: Vitamin B1 (Thiamine): 118.8 nmol/L (ref 66.5–200.0)

## 2021-03-13 NOTE — Progress Notes (Signed)
[facility] Progress Note   Dialysis Orders: TTS [facility] 4h 400/500 37.5kg 2/2 bath RUA AVF/ RIJ TDC - sensipar 90 po tiw, on hold from 4/14 - mircera 150 q2 last 4.28 - calcitriol 1.25 ug tiw   Assessment/ Plan:    1. Shock- acute,hypotension event very shortly after initiating HD, BP's were in the70'sat HD unit. Sent to ED.Noted drop in hgb in ED, pRBC given.Continues to have hypotension with HD, midodrine added on dialysis days. 2. AMS - 2/2 seizure activity vs hypotension. Neurology consulted.  3. R arm pain/infiltration- Seen by [facility],infiltration noted but not enough bleeding to be cause of drop in Hgb. Plan to rest AVF x 2wks. Hb improved now at11.9s/p total 3 units since admission. 4. ESRD- on HD TTS.  Palliative care much appreciated in this patient. HD cessation which is appropriate and will sign off.   5. BP/ volume -  hypotension on HD req extra doses of midodrine. On admit Vol overloaded on exam and by CT w/ significant ascites and areas of localized pitting edema.     6. Seizures- takes keppra 7. Atrial Fib- on amiodarone, IV metoprolol. Not on AC due to prior ICH, hx of anemia 8. Anemia ckd- Hgb 11.2 Aranesp dose given on 5/14/22Now s/p 3units of blood since admit and improved to goal.  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 Binders and calcitriol-Sensipar on hold from outpatient; hypophosphatemic.  10. Nutrition -changed to dysphagia diet due to spitting out food.  Continues to have poor intake -Cortrak placed 5/20. TF +protein supplements per RD    Subjective:   Seen in room. Lethargic.    Objective:   BP 132/63 (BP Location: Left Arm)   Pulse 90   Temp 98 F (36.7 C) (Oral)   Resp 16   Ht 5' 0.98" (1.549 m)   Wt 43.5 kg   SpO2 95%   BMI 18.13 kg/m   Intake/Output Summary (Last 24 hours) at 03/13/2021 0710 Last data filed at 03/12/2021 1733 Gross per 24 hour  Intake 10 ml  Output 2000 ml  Net -1990 ml   Weight  change: 0.2 kg  Physical Exam: General: chronically ill appearing, NGT in place   Heart: RRR Lungs: CTAB, nml WOB Abdomen: soft, NTND Extremities: no LE edema Dialysis Access: TDC, RU BCF +thrill, RUE swelling but not pulsatile  Imaging: No results found.  Labs: BMET Recent Labs  Lab 03/07/21 0252 03/07/21 [id number] 03/08/21 0253 03/09/21 0102 03/10/21 0103 03/11/21 0253 03/12/21 0142  NA 136 135 134* 134* 136 132* 132*  K 4.2 4.3 3.9 4.2 4.3 5.3* 5.6*  CL 99 98 98 100 99 97* 96*  CO2 '27 27 30 29 28 28 27  '$ GLUCOSE 65* 59* 115* 114* 141* 120* 119*  BUN 22* 23* 15 25* 23* 37* 54*  CREATININE 4.98* 5.36* 3.67* 4.33* 2.97* 3.70* 4.13*  CALCIUM 10.0 9.6 9.6 9.5 9.2 9.4 9.3  PHOS 4.2 4.4 3.9 4.0 1.6* 1.8* 1.3*   CBC Recent Labs  Lab 03/09/21 0102 03/10/21 0103 03/11/21 0253 03/12/21 0142  WBC 5.6 5.3 5.6 6.0  NEUTROABS 4.5 4.3 4.5 5.0  HGB 11.7* 11.2* 11.3* 10.9*  HCT 37.3 34.8* 35.1* 33.6*  MCV 88.2 87.2 86.2 85.3  PLT 91* 79* 100* 156    Medications:    . chlorhexidine  15 mL Mouth Rinse BID  . LORazepam  1 mg Intravenous Q4H  . mouth rinse  15 mL Mouth Rinse q12n4p  Otelia Santee, MD 03/13/2021, 7:10 AM

## 2021-03-13 NOTE — Progress Notes (Signed)
Daily Progress Note   Patient Name: [name]       Date: 03/13/2021 DOB: 03-03-1961  Age: 60 y.o. MRN#: [id number] Attending Physician: Enzo Bi, MD Primary Care Physician: Pcp, No Admit Date: 02/28/2021  Reason for Consultation/Follow-up: Establishing goals of care  Subjective: No family at bedside. Patient does not wake to voice or touch.   Length of Stay: 13  Current Medications: Scheduled Meds:  . chlorhexidine  15 mL Mouth Rinse BID  . LORazepam  1 mg Intravenous Q4H  . mouth rinse  15 mL Mouth Rinse q12n4p    Continuous Infusions: . sodium chloride      PRN Meds: acetaminophen **OR** acetaminophen, antiseptic oral rinse, glycopyrrolate **OR** glycopyrrolate **OR** glycopyrrolate, haloperidol **OR** haloperidol **OR** haloperidol lactate, HYDROmorphone (DILAUDID) injection, LORazepam **OR** [DISCONTINUED] LORazepam **OR** LORazepam, ondansetron (ZOFRAN) IV, phenol, polyvinyl alcohol  Physical Exam Constitutional:      Comments: Somnolent   Pulmonary:     Effort: Pulmonary effort is normal.  Skin:    General: Skin is warm and dry.      Vital Signs: BP (!) 158/81 (BP Location: Left Arm)   Pulse (!) 102   Temp 97.8 F (36.6 C) (Axillary)   Resp 16   Ht 5' 0.98" (1.549 m)   Wt 43.5 kg   SpO2 100%   BMI 18.13 kg/m  SpO2: SpO2: 100 % O2 Device: O2 Device: Room Air O2 Flow Rate:    Intake/output summary:   Intake/Output Summary (Last 24 hours) at 03/13/2021 1210 Last data filed at 03/13/2021 1018 Gross per 24 hour  Intake 60 ml  Output --  Net 60 ml   LBM: Last BM Date: 03/07/21 Baseline Weight: Weight: 42.1 kg Most recent weight: Weight: 43.5 kg       Palliative Assessment/Data: PPS 20% - very poor PO intake    Flowsheet Rows   Flowsheet Row Most Recent  Value  Intake Tab   Referral Department Hospitalist  Unit at Time of Referral Cardiac/Telemetry Unit  Palliative Care Primary Diagnosis Nephrology  Date Notified 03/05/21  Palliative Care Type New Palliative care  Reason for referral Clarify Goals of Care  Date of Admission 02/28/21  Date first seen by Palliative Care 03/06/21  # of days Palliative referral response time 1 Day(s)  # of days IP prior to Palliative referral 5  Clinical Assessment   Psychosocial & Spiritual Assessment   Palliative Care Outcomes       Patient Active Problem List   Diagnosis Date Noted  . Hemorrhagic shock ([place]) 02/28/2021  . Pressure injury of skin 11/29/2020  . Pleural effusion 11/28/2020  . Subdural fluid collection 11/27/2020  . Left wrist fracture 11/27/2020  . Leg DVT (deep venous thromboembolism), acute, bilateral ([place]) 11/27/2020  . Hyperthyroidism 11/27/2020  . Malnutrition of moderate degree 11/20/2020  . Epigastric abdominal pain 11/08/2020  . NSTEMI (non-ST elevated myocardial infarction) ([place]) 10/16/2020  . AMS (altered mental status) 10/06/2020  . Elevated troponin 10/06/2020  . Headache, unspecified 11/28/2019  . Hypercalcemia 11/10/2019  . Allergy, unspecified, initial encounter 08/08/2019  . Hypokalemia 07/07/2019  . Sepsis, unspecified organism ([place]) 02/03/2019  . End-stage renal disease on hemodialysis ([place]) 02/03/2019  . Unspecified atrial  fibrillation ([place]) 02/03/2019  . Hypotension 02/02/2019  . Syncope and collapse   . Goals of care, counseling/discussion   . Atrial fibrillation with rapid ventricular response ([place]) 03/20/2018  . Chills (without fever) 03/16/2018  . Iron deficiency anemia, unspecified 02/04/2018  . 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 Anemia in chronic kidney disease 12/30/2017  . Fever, unspecified 12/30/2017  . Generalized abdominal pain 12/30/2017  . Heart failure, unspecified ([place]) 12/30/2017  . Other specified personal risk factors, not  elsewhere classified 12/30/2017  . Pure hypercholesterolemia, unspecified 12/30/2017  . Secondary hyperparathyroidism of renal origin ([place]) 12/30/2017  . Shortness of breath 12/30/2017  . History of embolic stroke 123XX123  . Altered mental status 11/08/2013  . HTN (hypertension) 11/08/2013  . Hypertensive renal disease 11/06/2013  . Protein-calorie malnutrition, severe ([place]) 11/04/2013  . Seizure disorder ([place]) 10/22/2013  . Hypertensive urgency 10/20/2013  . Hypertensive heart disease 05/07/2013  . Acute-on-chronic renal failure ([place]) 05/07/2013  . HTN (hypertension), malignant 05/06/2013  . CAD (coronary artery disease) 05/06/2013  . ESRD (end stage renal disease) ([place]) 05/06/2013  . Chronic diastolic heart failure ([place])   . Tobacco abuse 09/25/2012  . H/O noncompliance with medical treatment, presenting hazards to health   . Renal artery stenosis ([place]) 04/05/2012  . History of subarachnoid hemorrhage 03/03/2012  . Mixed hyperlipidemia 09/19/2008  . Acute on chronic diastolic heart failure ([place]) 09/19/2008    Palliative Care Assessment & Plan   HPI: 60 y.o. female  with past medical history of HTN, diastolic HF, CAD (NSTEMI 123XX123 s/p DES to RCA), PAF (not on [facility]), CVA/SAH s/p VP shunt (2013) and ESRD on HD admitted on 02/28/2021 with AMS. On day of admission patient began dialysis and 30 seconds after cannulation, site was noted to be infiltrating into the R chest and R arm. Patient had questionable seizure activity (per HD center staff) and became unresponsive. Found to be anemic. Patient has received several units of blood. Mental status has improved to baseline. Poor PO intake, very frail. PMT consulted for [place].   Assessment: Patient does not wake for me. Appears comfortable - breathing is even and unlabored.  Spoke with RN - patient was awake shortly before my arrival - some discomfort and RN administered medications to promote comfort. No other concerns.   Pending transfer  to hospice facility - delayed yesterday d/t issues with technology.  Family asks that we call [name] at [phone]  Recommendations/Plan: Comfort measures only No further HD Scheduled ativan d/t history of seizures and refusing pills Continue PRN medications to promote comfort Transfer to hospice facility Call daughter [name] at [phone] for updates  Goals of Care and Additional Recommendations: Limitations on Scope of Treatment: Full Comfort Care  Code Status: DNR  Prognosis:  < 2 weeks  Discharge Planning: Hospice facility  Care plan was discussed with RN  Thank you for allowing the Palliative Medicine Team to assist in the care of this patient.   Total Time 20 minutes Prolonged Time Billed  no       Greater than 50%  of this time was spent counseling and coordinating care related to the above assessment and plan.  Juel Burrow, DNP, [facility] Palliative Medicine Team Team Phone # [phone]  Pager [phone]

## 2021-03-13 NOTE — Plan of Care (Signed)
Pt discharging to hospice facility.

## 2021-03-13 NOTE — TOC Transition Note (Signed)
Transition of Care [facility]) - CM/SW Discharge Note   Patient Details  Name: [name] MRN: [id number] Date of Birth: [birth year]  Transition [facility] CM/SW Contact:  Benard Halsted, LCSW Phone Number: 03/13/2021, 1:29 PM   Clinical Narrative:    Patient will DC to: [facility] Anticipated DC date: 03/13/21 Family notified: Daughter, Biomedical engineer by: Corey Harold  Per MD patient ready for DC to Hospice. RN to call report prior to discharge [phone]). RN, patient, patient's family, and facility notified of DC. Discharge Summary sent to facility. DC packet on chart. Ambulance transport requested for patient.   CSW will sign off for now as social work intervention is no longer needed. Please consult [place] again if new needs arise.      Final next level of care: [place] Barriers to Discharge: Barriers Resolved   Patient Goals and CMS Choice Patient states their goals for this hospitalization and ongoing recovery are:: comfort CMS Medicare.gov Compare Post Acute Care list provided to:: Patient Represenative (must comment) Choice offered to / list presented to : Adult [place]  Discharge Placement                Patient to be transferred to facility by: [place] Name of family member notified: Daughter, [name] Patient and family notified of of transfer: 03/13/21  Discharge Plan and Services In-house Referral: Clinical Social Work   Post Acute Care Choice: [place]                               Social Determinants of Health (SDOH) Interventions     Readmission Risk Interventions Readmission Risk Prevention Plan 03/05/2021  Transportation Screening Complete  Medication Review Press photographer) Complete  PCP or Specialist appointment within 3-5 days of discharge Complete  HRI or Home Care Consult Complete  SW Recovery Care/Counseling Consult Complete  Palliative Care Screening Complete  Skilled Nursing  Facility Complete  Some recent data might be hidden

## 2021-03-13 NOTE — Progress Notes (Signed)
Pt has been resting most of the afternoon. No s/s of discomfort or air hunger. Report given to Hospice nurse earlier today . Will cont to monitor pt until PTAR arrives to tx her to hospice facility.

## 2021-03-13 NOTE — Progress Notes (Signed)
Pt seen and examined at bedside.  No distress.  Resting comfortably.  Pt is due to discharge to hospice facility today.  No charge note.
